# Patient Record
Sex: Female | Born: 1955 | Race: White | Hispanic: No | Smoking: Current every day smoker
Health system: Southern US, Community
[De-identification: ages and names within clinical notes are randomized; demographics above are authoritative.]

## PROBLEM LIST (undated history)

## (undated) ENCOUNTER — Inpatient Hospital Stay: Admission: EM | Payer: Self-pay | Source: Home / Self Care

## (undated) DIAGNOSIS — IMO0002 Reserved for concepts with insufficient information to code with codable children: Secondary | ICD-10-CM

## (undated) DIAGNOSIS — R7303 Prediabetes: Secondary | ICD-10-CM

## (undated) DIAGNOSIS — M1710 Unilateral primary osteoarthritis, unspecified knee: Secondary | ICD-10-CM

## (undated) DIAGNOSIS — M25529 Pain in unspecified elbow: Secondary | ICD-10-CM

## (undated) DIAGNOSIS — N189 Chronic kidney disease, unspecified: Secondary | ICD-10-CM

## (undated) DIAGNOSIS — R112 Nausea with vomiting, unspecified: Secondary | ICD-10-CM

## (undated) DIAGNOSIS — A6000 Herpesviral infection of urogenital system, unspecified: Secondary | ICD-10-CM

## (undated) DIAGNOSIS — E079 Disorder of thyroid, unspecified: Secondary | ICD-10-CM

## (undated) DIAGNOSIS — G2581 Restless legs syndrome: Secondary | ICD-10-CM

## (undated) DIAGNOSIS — R011 Cardiac murmur, unspecified: Secondary | ICD-10-CM

## (undated) DIAGNOSIS — M7530 Calcific tendinitis of unspecified shoulder: Secondary | ICD-10-CM

## (undated) DIAGNOSIS — M549 Dorsalgia, unspecified: Secondary | ICD-10-CM

## (undated) DIAGNOSIS — M961 Postlaminectomy syndrome, not elsewhere classified: Secondary | ICD-10-CM

## (undated) DIAGNOSIS — E039 Hypothyroidism, unspecified: Secondary | ICD-10-CM

## (undated) DIAGNOSIS — G4730 Sleep apnea, unspecified: Secondary | ICD-10-CM

## (undated) DIAGNOSIS — F419 Anxiety disorder, unspecified: Secondary | ICD-10-CM

## (undated) DIAGNOSIS — G8929 Other chronic pain: Secondary | ICD-10-CM

## (undated) DIAGNOSIS — E785 Hyperlipidemia, unspecified: Secondary | ICD-10-CM

## (undated) DIAGNOSIS — Z9289 Personal history of other medical treatment: Secondary | ICD-10-CM

## (undated) DIAGNOSIS — M545 Low back pain, unspecified: Secondary | ICD-10-CM

## (undated) DIAGNOSIS — R0600 Dyspnea, unspecified: Secondary | ICD-10-CM

## (undated) DIAGNOSIS — J439 Emphysema, unspecified: Secondary | ICD-10-CM

## (undated) DIAGNOSIS — J939 Pneumothorax, unspecified: Secondary | ICD-10-CM

## (undated) DIAGNOSIS — Z9889 Other specified postprocedural states: Secondary | ICD-10-CM

## (undated) DIAGNOSIS — K219 Gastro-esophageal reflux disease without esophagitis: Secondary | ICD-10-CM

## (undated) DIAGNOSIS — F341 Dysthymic disorder: Secondary | ICD-10-CM

## (undated) DIAGNOSIS — I10 Essential (primary) hypertension: Secondary | ICD-10-CM

## (undated) DIAGNOSIS — R519 Headache, unspecified: Secondary | ICD-10-CM

## (undated) DIAGNOSIS — G894 Chronic pain syndrome: Secondary | ICD-10-CM

## (undated) DIAGNOSIS — F17200 Nicotine dependence, unspecified, uncomplicated: Secondary | ICD-10-CM

## (undated) DIAGNOSIS — J449 Chronic obstructive pulmonary disease, unspecified: Secondary | ICD-10-CM

## (undated) DIAGNOSIS — R51 Headache: Secondary | ICD-10-CM

## (undated) HISTORY — DX: Restless legs syndrome: G25.81

## (undated) HISTORY — DX: Postlaminectomy syndrome, not elsewhere classified: M96.1

## (undated) HISTORY — PX: UVULOPALATOPHARYNGOPLASTY: SHX827

## (undated) HISTORY — DX: Reserved for concepts with insufficient information to code with codable children: IMO0002

## (undated) HISTORY — DX: Low back pain: M54.5

## (undated) HISTORY — DX: Calcific tendinitis of unspecified shoulder: M75.30

## (undated) HISTORY — DX: Dysthymic disorder: F34.1

## (undated) HISTORY — DX: Unilateral primary osteoarthritis, unspecified knee: M17.10

## (undated) HISTORY — PX: TUBAL LIGATION: SHX77

## (undated) HISTORY — DX: Gastro-esophageal reflux disease without esophagitis: K21.9

## (undated) HISTORY — DX: Pain in unspecified elbow: M25.529

## (undated) HISTORY — DX: Disorder of thyroid, unspecified: E07.9

## (undated) HISTORY — PX: HAMMER TOE SURGERY: SHX385

## (undated) HISTORY — DX: Chronic pain syndrome: G89.4

## (undated) HISTORY — PX: DILATION AND CURETTAGE OF UTERUS: SHX78

## (undated) HISTORY — PX: TOTAL HIP ARTHROPLASTY: SHX124

## (undated) HISTORY — PX: BACK SURGERY: SHX140

## (undated) HISTORY — DX: Hyperlipidemia, unspecified: E78.5

## (undated) HISTORY — DX: Low back pain, unspecified: M54.50

## (undated) HISTORY — PX: LAPAROSCOPIC CHOLECYSTECTOMY: SUR755

## (undated) HISTORY — PX: VAGINAL HYSTERECTOMY: SUR661

## (undated) HISTORY — PX: KNEE ARTHROSCOPY: SHX127

## (undated) HISTORY — DX: Essential (primary) hypertension: I10

## (undated) HISTORY — PX: JOINT REPLACEMENT: SHX530

## (undated) HISTORY — PX: APPENDECTOMY: SHX54

## (undated) HISTORY — DX: Nicotine dependence, unspecified, uncomplicated: F17.200

---

## 1978-09-02 DIAGNOSIS — Z9289 Personal history of other medical treatment: Secondary | ICD-10-CM

## 1978-09-02 HISTORY — DX: Personal history of other medical treatment: Z92.89

## 1998-01-14 ENCOUNTER — Inpatient Hospital Stay (HOSPITAL_COMMUNITY): Admission: AD | Admit: 1998-01-14 | Discharge: 1998-01-14 | Payer: Self-pay | Admitting: *Deleted

## 1998-06-01 ENCOUNTER — Encounter: Payer: Self-pay | Admitting: Specialist

## 1998-06-02 ENCOUNTER — Encounter: Payer: Self-pay | Admitting: Specialist

## 1998-06-02 ENCOUNTER — Observation Stay (HOSPITAL_COMMUNITY): Admission: RE | Admit: 1998-06-02 | Discharge: 1998-06-02 | Payer: Self-pay | Admitting: Specialist

## 1998-07-07 ENCOUNTER — Encounter: Payer: Self-pay | Admitting: Specialist

## 1998-07-07 ENCOUNTER — Ambulatory Visit (HOSPITAL_COMMUNITY): Admission: RE | Admit: 1998-07-07 | Discharge: 1998-07-07 | Payer: Self-pay | Admitting: Specialist

## 1998-11-07 ENCOUNTER — Other Ambulatory Visit: Admission: RE | Admit: 1998-11-07 | Discharge: 1998-11-07 | Payer: Self-pay | Admitting: *Deleted

## 1998-12-11 ENCOUNTER — Ambulatory Visit (HOSPITAL_COMMUNITY): Admission: RE | Admit: 1998-12-11 | Discharge: 1998-12-11 | Payer: Self-pay | Admitting: *Deleted

## 1999-01-02 ENCOUNTER — Emergency Department (HOSPITAL_COMMUNITY): Admission: EM | Admit: 1999-01-02 | Discharge: 1999-01-02 | Payer: Self-pay | Admitting: Emergency Medicine

## 1999-07-06 ENCOUNTER — Encounter: Payer: Self-pay | Admitting: Specialist

## 1999-07-06 ENCOUNTER — Encounter: Admission: RE | Admit: 1999-07-06 | Discharge: 1999-07-06 | Payer: Self-pay | Admitting: Specialist

## 1999-08-30 ENCOUNTER — Encounter: Payer: Self-pay | Admitting: Specialist

## 1999-08-30 ENCOUNTER — Ambulatory Visit (HOSPITAL_COMMUNITY): Admission: RE | Admit: 1999-08-30 | Discharge: 1999-08-30 | Payer: Self-pay | Admitting: Specialist

## 1999-10-15 ENCOUNTER — Encounter: Payer: Self-pay | Admitting: Specialist

## 1999-10-15 ENCOUNTER — Ambulatory Visit (HOSPITAL_COMMUNITY): Admission: RE | Admit: 1999-10-15 | Discharge: 1999-10-15 | Payer: Self-pay | Admitting: Specialist

## 1999-12-21 ENCOUNTER — Encounter: Payer: Self-pay | Admitting: Specialist

## 1999-12-24 ENCOUNTER — Encounter: Payer: Self-pay | Admitting: Specialist

## 1999-12-24 ENCOUNTER — Inpatient Hospital Stay (HOSPITAL_COMMUNITY): Admission: RE | Admit: 1999-12-24 | Discharge: 1999-12-28 | Payer: Self-pay | Admitting: Specialist

## 2000-03-31 ENCOUNTER — Ambulatory Visit (HOSPITAL_COMMUNITY): Admission: RE | Admit: 2000-03-31 | Discharge: 2000-03-31 | Payer: Self-pay | Admitting: Specialist

## 2000-03-31 ENCOUNTER — Encounter: Payer: Self-pay | Admitting: Specialist

## 2000-04-17 ENCOUNTER — Encounter
Admission: RE | Admit: 2000-04-17 | Discharge: 2000-05-01 | Payer: Self-pay | Admitting: Physical Medicine and Rehabilitation

## 2000-04-23 ENCOUNTER — Other Ambulatory Visit: Admission: RE | Admit: 2000-04-23 | Discharge: 2000-04-23 | Payer: Self-pay | Admitting: *Deleted

## 2000-11-17 ENCOUNTER — Encounter: Admission: RE | Admit: 2000-11-17 | Discharge: 2000-11-17 | Payer: Self-pay | Admitting: Specialist

## 2000-11-17 ENCOUNTER — Encounter: Payer: Self-pay | Admitting: Specialist

## 2001-04-29 ENCOUNTER — Encounter: Admission: RE | Admit: 2001-04-29 | Discharge: 2001-04-29 | Payer: Self-pay | Admitting: Orthopaedic Surgery

## 2001-04-29 ENCOUNTER — Encounter: Payer: Self-pay | Admitting: Orthopaedic Surgery

## 2001-05-21 ENCOUNTER — Encounter: Payer: Self-pay | Admitting: Orthopaedic Surgery

## 2001-05-27 ENCOUNTER — Encounter: Payer: Self-pay | Admitting: Orthopaedic Surgery

## 2001-05-27 ENCOUNTER — Inpatient Hospital Stay (HOSPITAL_COMMUNITY): Admission: RE | Admit: 2001-05-27 | Discharge: 2001-06-01 | Payer: Self-pay | Admitting: Orthopaedic Surgery

## 2001-06-01 ENCOUNTER — Encounter: Payer: Self-pay | Admitting: Orthopaedic Surgery

## 2001-11-17 ENCOUNTER — Emergency Department (HOSPITAL_COMMUNITY): Admission: EM | Admit: 2001-11-17 | Discharge: 2001-11-18 | Payer: Self-pay | Admitting: *Deleted

## 2001-11-23 ENCOUNTER — Encounter: Admission: RE | Admit: 2001-11-23 | Discharge: 2001-11-23 | Payer: Self-pay | Admitting: Urology

## 2001-11-23 ENCOUNTER — Encounter: Payer: Self-pay | Admitting: Urology

## 2001-11-24 ENCOUNTER — Encounter: Admission: RE | Admit: 2001-11-24 | Discharge: 2001-11-24 | Payer: Self-pay | Admitting: Pain Medicine

## 2001-11-24 ENCOUNTER — Encounter: Payer: Self-pay | Admitting: Pain Medicine

## 2001-12-07 ENCOUNTER — Other Ambulatory Visit: Admission: RE | Admit: 2001-12-07 | Discharge: 2001-12-07 | Payer: Self-pay | Admitting: *Deleted

## 2001-12-09 ENCOUNTER — Emergency Department (HOSPITAL_COMMUNITY): Admission: EM | Admit: 2001-12-09 | Discharge: 2001-12-10 | Payer: Self-pay | Admitting: Emergency Medicine

## 2001-12-09 ENCOUNTER — Encounter: Payer: Self-pay | Admitting: Emergency Medicine

## 2002-01-04 ENCOUNTER — Ambulatory Visit (HOSPITAL_BASED_OUTPATIENT_CLINIC_OR_DEPARTMENT_OTHER): Admission: RE | Admit: 2002-01-04 | Discharge: 2002-01-04 | Payer: Self-pay | Admitting: Urology

## 2003-05-12 ENCOUNTER — Other Ambulatory Visit: Admission: RE | Admit: 2003-05-12 | Discharge: 2003-05-12 | Payer: Self-pay | Admitting: *Deleted

## 2003-10-31 ENCOUNTER — Ambulatory Visit (HOSPITAL_BASED_OUTPATIENT_CLINIC_OR_DEPARTMENT_OTHER): Admission: RE | Admit: 2003-10-31 | Discharge: 2003-10-31 | Payer: Self-pay | Admitting: Neurology

## 2003-11-19 ENCOUNTER — Ambulatory Visit (HOSPITAL_BASED_OUTPATIENT_CLINIC_OR_DEPARTMENT_OTHER): Admission: RE | Admit: 2003-11-19 | Discharge: 2003-11-19 | Payer: Self-pay | Admitting: Neurology

## 2004-06-06 ENCOUNTER — Ambulatory Visit: Payer: Self-pay | Admitting: Physician Assistant

## 2004-07-03 ENCOUNTER — Ambulatory Visit: Payer: Self-pay | Admitting: Physician Assistant

## 2004-07-09 ENCOUNTER — Other Ambulatory Visit: Admission: RE | Admit: 2004-07-09 | Discharge: 2004-07-09 | Payer: Self-pay | Admitting: *Deleted

## 2004-08-02 ENCOUNTER — Ambulatory Visit: Payer: Self-pay | Admitting: Physician Assistant

## 2004-08-30 ENCOUNTER — Ambulatory Visit: Payer: Self-pay | Admitting: Physician Assistant

## 2004-08-30 ENCOUNTER — Emergency Department: Payer: Self-pay | Admitting: Unknown Physician Specialty

## 2004-10-01 ENCOUNTER — Ambulatory Visit: Payer: Self-pay | Admitting: Physician Assistant

## 2004-11-09 ENCOUNTER — Ambulatory Visit: Payer: Self-pay | Admitting: Physician Assistant

## 2004-11-22 ENCOUNTER — Ambulatory Visit: Payer: Self-pay | Admitting: Physician Assistant

## 2004-11-29 ENCOUNTER — Ambulatory Visit: Payer: Self-pay | Admitting: Pain Medicine

## 2005-01-01 ENCOUNTER — Ambulatory Visit: Payer: Self-pay | Admitting: Physician Assistant

## 2005-02-04 ENCOUNTER — Ambulatory Visit: Payer: Self-pay | Admitting: Physician Assistant

## 2005-02-11 ENCOUNTER — Ambulatory Visit: Payer: Self-pay | Admitting: Physician Assistant

## 2005-03-06 ENCOUNTER — Ambulatory Visit: Payer: Self-pay | Admitting: Physician Assistant

## 2005-04-03 ENCOUNTER — Ambulatory Visit: Payer: Self-pay | Admitting: Physician Assistant

## 2005-05-01 ENCOUNTER — Ambulatory Visit: Payer: Self-pay | Admitting: Physician Assistant

## 2005-05-29 ENCOUNTER — Ambulatory Visit: Payer: Self-pay | Admitting: Physician Assistant

## 2005-06-27 ENCOUNTER — Ambulatory Visit: Payer: Self-pay | Admitting: Physician Assistant

## 2005-08-08 ENCOUNTER — Ambulatory Visit: Payer: Self-pay | Admitting: Physician Assistant

## 2005-09-04 ENCOUNTER — Ambulatory Visit: Payer: Self-pay | Admitting: Physician Assistant

## 2005-09-16 ENCOUNTER — Ambulatory Visit: Payer: Self-pay | Admitting: Physician Assistant

## 2005-10-01 ENCOUNTER — Ambulatory Visit: Payer: Self-pay | Admitting: Physician Assistant

## 2005-11-13 ENCOUNTER — Ambulatory Visit: Payer: Self-pay | Admitting: Physician Assistant

## 2005-12-19 ENCOUNTER — Ambulatory Visit: Payer: Self-pay | Admitting: Physician Assistant

## 2006-01-06 ENCOUNTER — Encounter: Admission: RE | Admit: 2006-01-06 | Discharge: 2006-01-06 | Payer: Self-pay | Admitting: Specialist

## 2006-01-14 ENCOUNTER — Ambulatory Visit: Payer: Self-pay | Admitting: Physician Assistant

## 2006-02-10 ENCOUNTER — Ambulatory Visit: Payer: Self-pay | Admitting: Physician Assistant

## 2006-02-11 ENCOUNTER — Observation Stay (HOSPITAL_COMMUNITY): Admission: RE | Admit: 2006-02-11 | Discharge: 2006-02-12 | Payer: Self-pay | Admitting: Specialist

## 2006-03-18 ENCOUNTER — Ambulatory Visit: Payer: Self-pay | Admitting: Physician Assistant

## 2006-04-16 ENCOUNTER — Ambulatory Visit: Payer: Self-pay | Admitting: Physician Assistant

## 2006-05-13 ENCOUNTER — Ambulatory Visit: Payer: Self-pay | Admitting: Physician Assistant

## 2006-06-11 ENCOUNTER — Ambulatory Visit: Payer: Self-pay | Admitting: Pain Medicine

## 2006-06-11 ENCOUNTER — Ambulatory Visit: Payer: Self-pay | Admitting: Physician Assistant

## 2006-07-14 ENCOUNTER — Ambulatory Visit: Payer: Self-pay | Admitting: Physician Assistant

## 2006-08-13 ENCOUNTER — Ambulatory Visit: Payer: Self-pay | Admitting: Physician Assistant

## 2006-09-10 ENCOUNTER — Ambulatory Visit: Payer: Self-pay | Admitting: Pain Medicine

## 2006-10-13 ENCOUNTER — Ambulatory Visit: Payer: Self-pay | Admitting: Physician Assistant

## 2006-11-01 ENCOUNTER — Encounter: Admission: RE | Admit: 2006-11-01 | Discharge: 2006-11-01 | Payer: Self-pay | Admitting: Family Medicine

## 2006-11-13 ENCOUNTER — Ambulatory Visit: Payer: Self-pay | Admitting: Physician Assistant

## 2006-12-11 ENCOUNTER — Ambulatory Visit: Payer: Self-pay | Admitting: Pain Medicine

## 2006-12-11 ENCOUNTER — Ambulatory Visit: Payer: Self-pay | Admitting: Physician Assistant

## 2007-01-12 ENCOUNTER — Ambulatory Visit: Payer: Self-pay | Admitting: Physician Assistant

## 2007-01-18 ENCOUNTER — Encounter: Admission: RE | Admit: 2007-01-18 | Discharge: 2007-01-18 | Payer: Self-pay | Admitting: Specialist

## 2007-02-05 ENCOUNTER — Ambulatory Visit: Payer: Self-pay | Admitting: Physician Assistant

## 2007-05-03 ENCOUNTER — Encounter: Admission: RE | Admit: 2007-05-03 | Discharge: 2007-05-03 | Payer: Self-pay | Admitting: Specialist

## 2007-09-18 ENCOUNTER — Ambulatory Visit: Payer: Self-pay | Admitting: Physical Medicine & Rehabilitation

## 2007-09-18 ENCOUNTER — Encounter
Admission: RE | Admit: 2007-09-18 | Discharge: 2007-09-21 | Payer: Self-pay | Admitting: Physical Medicine & Rehabilitation

## 2007-10-20 ENCOUNTER — Ambulatory Visit: Payer: Self-pay | Admitting: Physical Medicine & Rehabilitation

## 2007-12-09 ENCOUNTER — Other Ambulatory Visit: Admission: RE | Admit: 2007-12-09 | Discharge: 2007-12-09 | Payer: Self-pay | Admitting: *Deleted

## 2007-12-17 ENCOUNTER — Encounter
Admission: RE | Admit: 2007-12-17 | Discharge: 2008-03-16 | Payer: Self-pay | Admitting: Physical Medicine & Rehabilitation

## 2007-12-22 ENCOUNTER — Ambulatory Visit: Payer: Self-pay | Admitting: Physical Medicine & Rehabilitation

## 2008-01-20 ENCOUNTER — Ambulatory Visit: Payer: Self-pay | Admitting: Physical Medicine & Rehabilitation

## 2008-02-18 ENCOUNTER — Ambulatory Visit: Payer: Self-pay | Admitting: Physical Medicine & Rehabilitation

## 2008-03-02 ENCOUNTER — Encounter: Payer: Self-pay | Admitting: Internal Medicine

## 2008-03-21 ENCOUNTER — Encounter
Admission: RE | Admit: 2008-03-21 | Discharge: 2008-05-20 | Payer: Self-pay | Admitting: Physical Medicine & Rehabilitation

## 2008-03-22 ENCOUNTER — Ambulatory Visit: Payer: Self-pay | Admitting: Physical Medicine & Rehabilitation

## 2008-04-19 ENCOUNTER — Ambulatory Visit: Payer: Self-pay | Admitting: Physical Medicine & Rehabilitation

## 2008-05-20 ENCOUNTER — Ambulatory Visit: Payer: Self-pay | Admitting: Physical Medicine & Rehabilitation

## 2008-06-17 ENCOUNTER — Encounter
Admission: RE | Admit: 2008-06-17 | Discharge: 2008-09-15 | Payer: Self-pay | Admitting: Physical Medicine & Rehabilitation

## 2008-07-22 ENCOUNTER — Ambulatory Visit: Payer: Self-pay | Admitting: Physical Medicine & Rehabilitation

## 2008-08-19 ENCOUNTER — Ambulatory Visit: Payer: Self-pay | Admitting: Physical Medicine & Rehabilitation

## 2008-09-15 ENCOUNTER — Encounter
Admission: RE | Admit: 2008-09-15 | Discharge: 2008-12-07 | Payer: Self-pay | Admitting: Physical Medicine & Rehabilitation

## 2008-09-20 ENCOUNTER — Ambulatory Visit: Payer: Self-pay | Admitting: Physical Medicine & Rehabilitation

## 2008-10-19 ENCOUNTER — Ambulatory Visit: Payer: Self-pay | Admitting: Physical Medicine & Rehabilitation

## 2008-11-16 ENCOUNTER — Ambulatory Visit: Payer: Self-pay | Admitting: Physical Medicine & Rehabilitation

## 2008-12-07 ENCOUNTER — Encounter
Admission: RE | Admit: 2008-12-07 | Discharge: 2009-03-07 | Payer: Self-pay | Admitting: Physical Medicine & Rehabilitation

## 2008-12-13 ENCOUNTER — Ambulatory Visit: Payer: Self-pay | Admitting: Physical Medicine & Rehabilitation

## 2009-01-17 ENCOUNTER — Ambulatory Visit: Payer: Self-pay | Admitting: Physical Medicine & Rehabilitation

## 2009-02-17 ENCOUNTER — Ambulatory Visit: Payer: Self-pay | Admitting: Physical Medicine & Rehabilitation

## 2009-03-15 ENCOUNTER — Encounter
Admission: RE | Admit: 2009-03-15 | Discharge: 2009-06-13 | Payer: Self-pay | Admitting: Physical Medicine & Rehabilitation

## 2009-03-17 ENCOUNTER — Ambulatory Visit: Payer: Self-pay | Admitting: Physical Medicine & Rehabilitation

## 2009-04-18 ENCOUNTER — Ambulatory Visit: Payer: Self-pay | Admitting: Physical Medicine & Rehabilitation

## 2009-05-17 ENCOUNTER — Ambulatory Visit: Payer: Self-pay | Admitting: Physical Medicine & Rehabilitation

## 2009-06-12 ENCOUNTER — Encounter
Admission: RE | Admit: 2009-06-12 | Discharge: 2009-08-23 | Payer: Self-pay | Admitting: Physical Medicine & Rehabilitation

## 2009-06-16 ENCOUNTER — Ambulatory Visit: Payer: Self-pay | Admitting: Physical Medicine & Rehabilitation

## 2009-07-17 ENCOUNTER — Ambulatory Visit: Payer: Self-pay | Admitting: Physical Medicine & Rehabilitation

## 2009-08-11 ENCOUNTER — Inpatient Hospital Stay (HOSPITAL_COMMUNITY): Admission: EM | Admit: 2009-08-11 | Discharge: 2009-08-16 | Payer: Self-pay | Admitting: Family Medicine

## 2009-09-22 ENCOUNTER — Encounter
Admission: RE | Admit: 2009-09-22 | Discharge: 2009-12-21 | Payer: Self-pay | Admitting: Physical Medicine & Rehabilitation

## 2009-10-16 ENCOUNTER — Ambulatory Visit: Payer: Self-pay | Admitting: Physical Medicine & Rehabilitation

## 2009-11-15 ENCOUNTER — Ambulatory Visit: Payer: Self-pay | Admitting: Physical Medicine & Rehabilitation

## 2009-12-14 ENCOUNTER — Ambulatory Visit: Payer: Self-pay | Admitting: Physical Medicine & Rehabilitation

## 2009-12-22 ENCOUNTER — Encounter: Admission: RE | Admit: 2009-12-22 | Discharge: 2009-12-22 | Payer: Self-pay | Admitting: Family Medicine

## 2010-01-09 ENCOUNTER — Encounter
Admission: RE | Admit: 2010-01-09 | Discharge: 2010-01-11 | Payer: Self-pay | Admitting: Physical Medicine & Rehabilitation

## 2010-01-11 ENCOUNTER — Ambulatory Visit: Payer: Self-pay | Admitting: Physical Medicine & Rehabilitation

## 2010-01-17 ENCOUNTER — Emergency Department (HOSPITAL_COMMUNITY): Admission: EM | Admit: 2010-01-17 | Discharge: 2010-01-17 | Payer: Self-pay | Admitting: Emergency Medicine

## 2010-03-07 ENCOUNTER — Encounter
Admission: RE | Admit: 2010-03-07 | Discharge: 2010-05-29 | Payer: Self-pay | Admitting: Physical Medicine & Rehabilitation

## 2010-03-07 ENCOUNTER — Ambulatory Visit: Payer: Self-pay | Admitting: Physical Medicine & Rehabilitation

## 2010-04-05 ENCOUNTER — Ambulatory Visit: Payer: Self-pay | Admitting: Physical Medicine & Rehabilitation

## 2010-05-03 ENCOUNTER — Ambulatory Visit: Payer: Self-pay | Admitting: Physical Medicine & Rehabilitation

## 2010-05-29 ENCOUNTER — Encounter
Admission: RE | Admit: 2010-05-29 | Discharge: 2010-08-13 | Payer: Self-pay | Source: Home / Self Care | Attending: Physical Medicine & Rehabilitation | Admitting: Physical Medicine & Rehabilitation

## 2010-06-05 ENCOUNTER — Ambulatory Visit: Payer: Self-pay | Admitting: Physical Medicine & Rehabilitation

## 2010-06-28 ENCOUNTER — Ambulatory Visit: Payer: Self-pay | Admitting: Physical Medicine & Rehabilitation

## 2010-07-24 ENCOUNTER — Ambulatory Visit: Payer: Self-pay | Admitting: Physical Medicine & Rehabilitation

## 2010-08-13 ENCOUNTER — Ambulatory Visit: Payer: Self-pay | Admitting: Physical Medicine & Rehabilitation

## 2010-08-20 ENCOUNTER — Inpatient Hospital Stay (HOSPITAL_COMMUNITY)
Admission: EM | Admit: 2010-08-20 | Discharge: 2010-08-22 | Payer: Self-pay | Source: Home / Self Care | Attending: General Surgery | Admitting: General Surgery

## 2010-08-21 ENCOUNTER — Encounter (INDEPENDENT_AMBULATORY_CARE_PROVIDER_SITE_OTHER): Payer: Self-pay | Admitting: General Surgery

## 2010-09-11 ENCOUNTER — Encounter
Admission: RE | Admit: 2010-09-11 | Discharge: 2010-09-13 | Payer: Self-pay | Source: Home / Self Care | Attending: Physical Medicine & Rehabilitation | Admitting: Physical Medicine & Rehabilitation

## 2010-09-13 ENCOUNTER — Ambulatory Visit
Admission: RE | Admit: 2010-09-13 | Discharge: 2010-09-13 | Payer: Self-pay | Source: Home / Self Care | Attending: Physical Medicine & Rehabilitation | Admitting: Physical Medicine & Rehabilitation

## 2010-10-05 NOTE — Consult Note (Signed)
Summary: [facility]  [facility]   Imported By: Bonner Puna 03/23/2008 15:16:42  _____________________________________________________________________  External Attachment:    Type:   Image     Comment:   External Document

## 2010-10-11 ENCOUNTER — Ambulatory Visit (HOSPITAL_BASED_OUTPATIENT_CLINIC_OR_DEPARTMENT_OTHER): Payer: Medicaid Other

## 2010-10-11 ENCOUNTER — Encounter: Payer: Medicare HMO | Attending: Physical Medicine & Rehabilitation

## 2010-10-11 DIAGNOSIS — M961 Postlaminectomy syndrome, not elsewhere classified: Secondary | ICD-10-CM

## 2010-10-11 DIAGNOSIS — M545 Low back pain, unspecified: Secondary | ICD-10-CM | POA: Insufficient documentation

## 2010-10-11 DIAGNOSIS — M1710 Unilateral primary osteoarthritis, unspecified knee: Secondary | ICD-10-CM | POA: Insufficient documentation

## 2010-10-11 DIAGNOSIS — G894 Chronic pain syndrome: Secondary | ICD-10-CM | POA: Insufficient documentation

## 2010-10-11 DIAGNOSIS — F341 Dysthymic disorder: Secondary | ICD-10-CM

## 2010-10-11 DIAGNOSIS — IMO0002 Reserved for concepts with insufficient information to code with codable children: Secondary | ICD-10-CM

## 2010-10-11 DIAGNOSIS — M7530 Calcific tendinitis of unspecified shoulder: Secondary | ICD-10-CM

## 2010-10-11 DIAGNOSIS — F17200 Nicotine dependence, unspecified, uncomplicated: Secondary | ICD-10-CM | POA: Insufficient documentation

## 2010-10-11 DIAGNOSIS — G2581 Restless legs syndrome: Secondary | ICD-10-CM | POA: Insufficient documentation

## 2010-10-11 DIAGNOSIS — Z981 Arthrodesis status: Secondary | ICD-10-CM | POA: Insufficient documentation

## 2010-10-11 DIAGNOSIS — Z96649 Presence of unspecified artificial hip joint: Secondary | ICD-10-CM | POA: Insufficient documentation

## 2010-11-12 LAB — URINALYSIS, ROUTINE W REFLEX MICROSCOPIC
Bilirubin Urine: NEGATIVE
Glucose, UA: NEGATIVE mg/dL
Ketones, ur: NEGATIVE mg/dL
Nitrite: NEGATIVE
Protein, ur: NEGATIVE mg/dL
Specific Gravity, Urine: 1.015 (ref 1.005–1.030)
Urobilinogen, UA: 1 mg/dL (ref 0.0–1.0)
pH: 7.5 (ref 5.0–8.0)

## 2010-11-12 LAB — COMPREHENSIVE METABOLIC PANEL
ALT: 11 U/L (ref 0–35)
AST: 20 U/L (ref 0–37)
Albumin: 4.2 g/dL (ref 3.5–5.2)
Alkaline Phosphatase: 130 U/L — ABNORMAL HIGH (ref 39–117)
BUN: 19 mg/dL (ref 6–23)
CO2: 30 mEq/L (ref 19–32)
Calcium: 9.6 mg/dL (ref 8.4–10.5)
Chloride: 101 mEq/L (ref 96–112)
Creatinine, Ser: 1 mg/dL (ref 0.4–1.2)
GFR calc Af Amer: >60 mL/min (ref >60)
GFR calc non Af Amer: 58 mL/min — ABNORMAL LOW (ref >60)
Glucose, Bld: 107 mg/dL — ABNORMAL HIGH (ref 70–99)
Potassium: 3.5 mEq/L (ref 3.5–5.1)
Sodium: 141 mEq/L (ref 135–145)
Total Bilirubin: 0.2 mg/dL — ABNORMAL LOW (ref 0.3–1.2)
Total Protein: 7.6 g/dL (ref 6.0–8.3)

## 2010-11-12 LAB — CBC
HCT: 40.3 % (ref 36.0–46.0)
Hemoglobin: 13.4 g/dL (ref 12.0–15.0)
MCH: 31.2 pg (ref 26.0–34.0)
MCHC: 33.3 g/dL (ref 30.0–36.0)
MCV: 93.7 fL (ref 78.0–100.0)
Platelets: 256 10*3/uL (ref 150–400)
RBC: 4.3 MIL/uL (ref 3.87–5.11)
RDW: 13.5 % (ref 11.5–15.5)
WBC: 6.3 10*3/uL (ref 4.0–10.5)

## 2010-11-12 LAB — DIFFERENTIAL
Basophils Absolute: 0 10*3/uL (ref 0.0–0.1)
Basophils Relative: 0 % (ref 0–1)
Eosinophils Absolute: 0.1 10*3/uL (ref 0.0–0.7)
Eosinophils Relative: 1 % (ref 0–5)
Lymphocytes Relative: 25 % (ref 12–46)
Lymphs Abs: 1.6 10*3/uL (ref 0.7–4.0)
Monocytes Absolute: 0.3 10*3/uL (ref 0.1–1.0)
Monocytes Relative: 4 % (ref 3–12)
Neutro Abs: 4.4 10*3/uL (ref 1.7–7.7)
Neutrophils Relative %: 70 % (ref 43–77)

## 2010-11-12 LAB — URINE MICROSCOPIC-ADD ON

## 2010-11-12 LAB — LIPASE, BLOOD: Lipase: 29 U/L (ref 11–59)

## 2010-11-16 ENCOUNTER — Ambulatory Visit (HOSPITAL_BASED_OUTPATIENT_CLINIC_OR_DEPARTMENT_OTHER): Payer: Medicaid Other | Admitting: Physical Medicine & Rehabilitation

## 2010-11-16 ENCOUNTER — Encounter: Payer: Medicare HMO | Attending: Physical Medicine & Rehabilitation

## 2010-11-16 DIAGNOSIS — Z981 Arthrodesis status: Secondary | ICD-10-CM | POA: Insufficient documentation

## 2010-11-16 DIAGNOSIS — F341 Dysthymic disorder: Secondary | ICD-10-CM

## 2010-11-16 DIAGNOSIS — M67919 Unspecified disorder of synovium and tendon, unspecified shoulder: Secondary | ICD-10-CM | POA: Insufficient documentation

## 2010-11-16 DIAGNOSIS — M961 Postlaminectomy syndrome, not elsewhere classified: Secondary | ICD-10-CM

## 2010-11-16 DIAGNOSIS — M7530 Calcific tendinitis of unspecified shoulder: Secondary | ICD-10-CM

## 2010-11-16 DIAGNOSIS — G2581 Restless legs syndrome: Secondary | ICD-10-CM | POA: Insufficient documentation

## 2010-11-16 DIAGNOSIS — M4120 Other idiopathic scoliosis, site unspecified: Secondary | ICD-10-CM | POA: Insufficient documentation

## 2010-11-16 DIAGNOSIS — M719 Bursopathy, unspecified: Secondary | ICD-10-CM | POA: Insufficient documentation

## 2010-11-16 DIAGNOSIS — M1710 Unilateral primary osteoarthritis, unspecified knee: Secondary | ICD-10-CM | POA: Insufficient documentation

## 2010-11-16 DIAGNOSIS — F17200 Nicotine dependence, unspecified, uncomplicated: Secondary | ICD-10-CM | POA: Insufficient documentation

## 2010-11-16 DIAGNOSIS — M25519 Pain in unspecified shoulder: Secondary | ICD-10-CM | POA: Insufficient documentation

## 2010-11-16 DIAGNOSIS — Z96649 Presence of unspecified artificial hip joint: Secondary | ICD-10-CM | POA: Insufficient documentation

## 2010-11-19 LAB — DIFFERENTIAL
Basophils Absolute: 0 10*3/uL (ref 0.0–0.1)
Basophils Relative: 1 % (ref 0–1)
Eosinophils Absolute: 0.1 10*3/uL (ref 0.0–0.7)
Eosinophils Relative: 2 % (ref 0–5)
Lymphocytes Relative: 35 % (ref 12–46)
Lymphs Abs: 2.3 10*3/uL (ref 0.7–4.0)
Monocytes Absolute: 0.3 10*3/uL (ref 0.1–1.0)
Monocytes Relative: 5 % (ref 3–12)
Neutro Abs: 3.8 10*3/uL (ref 1.7–7.7)
Neutrophils Relative %: 57 % (ref 43–77)

## 2010-11-19 LAB — POCT I-STAT, CHEM 8
BUN: 21 mg/dL (ref 6–23)
Calcium, Ion: 1.15 mmol/L (ref 1.12–1.32)
Chloride: 108 mEq/L (ref 96–112)
Creatinine, Ser: 0.8 mg/dL (ref 0.4–1.2)
Glucose, Bld: 104 mg/dL — ABNORMAL HIGH (ref 70–99)
HCT: 35 % — ABNORMAL LOW (ref 36.0–46.0)
Hemoglobin: 11.9 g/dL — ABNORMAL LOW (ref 12.0–15.0)
Potassium: 3.4 mEq/L — ABNORMAL LOW (ref 3.5–5.1)
Sodium: 142 mEq/L (ref 135–145)
TCO2: 27 mmol/L (ref 0–100)

## 2010-11-19 LAB — CBC
HCT: 34.8 % — ABNORMAL LOW (ref 36.0–46.0)
Hemoglobin: 12.1 g/dL (ref 12.0–15.0)
MCHC: 34.6 g/dL (ref 30.0–36.0)
MCV: 92.2 fL (ref 78.0–100.0)
Platelets: 209 10*3/uL (ref 150–400)
RBC: 3.78 MIL/uL — ABNORMAL LOW (ref 3.87–5.11)
RDW: 14.1 % (ref 11.5–15.5)
WBC: 6.7 10*3/uL (ref 4.0–10.5)

## 2010-11-19 LAB — POCT CARDIAC MARKERS
CKMB, poc: 1.8 ng/mL (ref 1.0–8.0)
Myoglobin, poc: 48.3 ng/mL (ref 12–200)
Troponin i, poc: <0.05 ng/mL (ref 0.00–0.09)

## 2010-11-19 LAB — D-DIMER, QUANTITATIVE: D-Dimer, Quant: 0.34 ug/mL-FEU (ref 0.00–0.48)

## 2010-12-04 LAB — BASIC METABOLIC PANEL
BUN: 22 mg/dL (ref 6–23)
BUN: 8 mg/dL (ref 6–23)
BUN: 8 mg/dL (ref 6–23)
CO2: 26 mEq/L (ref 19–32)
CO2: 31 mEq/L (ref 19–32)
CO2: 31 mEq/L (ref 19–32)
Calcium: 7.9 mg/dL — ABNORMAL LOW (ref 8.4–10.5)
Calcium: 8.4 mg/dL (ref 8.4–10.5)
Calcium: 8.7 mg/dL (ref 8.4–10.5)
Chloride: 100 mEq/L (ref 96–112)
Chloride: 102 mEq/L (ref 96–112)
Chloride: 105 mEq/L (ref 96–112)
Creatinine, Ser: 0.71 mg/dL (ref 0.4–1.2)
Creatinine, Ser: 0.91 mg/dL (ref 0.4–1.2)
Creatinine, Ser: 1.02 mg/dL (ref 0.4–1.2)
GFR calc Af Amer: >60 mL/min (ref >60)
GFR calc Af Amer: >60 mL/min (ref >60)
GFR calc Af Amer: >60 mL/min (ref >60)
GFR calc non Af Amer: 56 mL/min — ABNORMAL LOW (ref >60)
GFR calc non Af Amer: >60 mL/min (ref >60)
GFR calc non Af Amer: >60 mL/min (ref >60)
Glucose, Bld: 100 mg/dL — ABNORMAL HIGH (ref 70–99)
Glucose, Bld: 102 mg/dL — ABNORMAL HIGH (ref 70–99)
Glucose, Bld: 111 mg/dL — ABNORMAL HIGH (ref 70–99)
Potassium: 3.3 mEq/L — ABNORMAL LOW (ref 3.5–5.1)
Potassium: 3.7 mEq/L (ref 3.5–5.1)
Potassium: 3.7 mEq/L (ref 3.5–5.1)
Sodium: 136 mEq/L (ref 135–145)
Sodium: 139 mEq/L (ref 135–145)
Sodium: 139 mEq/L (ref 135–145)

## 2010-12-04 LAB — DIFFERENTIAL
Basophils Absolute: 0 10*3/uL (ref 0.0–0.1)
Basophils Absolute: 0 10*3/uL (ref 0.0–0.1)
Basophils Absolute: 0 10*3/uL (ref 0.0–0.1)
Basophils Absolute: 0 10*3/uL (ref 0.0–0.1)
Basophils Relative: 0 % (ref 0–1)
Basophils Relative: 0 % (ref 0–1)
Basophils Relative: 1 % (ref 0–1)
Basophils Relative: 1 % (ref 0–1)
Eosinophils Absolute: 0 10*3/uL (ref 0.0–0.7)
Eosinophils Absolute: 0.1 10*3/uL (ref 0.0–0.7)
Eosinophils Absolute: 0.1 10*3/uL (ref 0.0–0.7)
Eosinophils Absolute: 0.1 10*3/uL (ref 0.0–0.7)
Eosinophils Relative: 0 % (ref 0–5)
Eosinophils Relative: 2 % (ref 0–5)
Eosinophils Relative: 2 % (ref 0–5)
Eosinophils Relative: 2 % (ref 0–5)
Lymphocytes Relative: 33 % (ref 12–46)
Lymphocytes Relative: 36 % (ref 12–46)
Lymphocytes Relative: 38 % (ref 12–46)
Lymphocytes Relative: 8 % — ABNORMAL LOW (ref 12–46)
Lymphs Abs: 0.7 10*3/uL (ref 0.7–4.0)
Lymphs Abs: 1.7 10*3/uL (ref 0.7–4.0)
Lymphs Abs: 2 10*3/uL (ref 0.7–4.0)
Lymphs Abs: 2.2 10*3/uL (ref 0.7–4.0)
Monocytes Absolute: 0.4 10*3/uL (ref 0.1–1.0)
Monocytes Absolute: 0.4 10*3/uL (ref 0.1–1.0)
Monocytes Absolute: 0.4 10*3/uL (ref 0.1–1.0)
Monocytes Absolute: 0.5 10*3/uL (ref 0.1–1.0)
Monocytes Relative: 4 % (ref 3–12)
Monocytes Relative: 6 % (ref 3–12)
Monocytes Relative: 8 % (ref 3–12)
Monocytes Relative: 9 % (ref 3–12)
Neutro Abs: 2.8 10*3/uL (ref 1.7–7.7)
Neutro Abs: 2.9 10*3/uL (ref 1.7–7.7)
Neutro Abs: 3.4 10*3/uL (ref 1.7–7.7)
Neutro Abs: 8.3 10*3/uL — ABNORMAL HIGH (ref 1.7–7.7)
Neutrophils Relative %: 52 % (ref 43–77)
Neutrophils Relative %: 56 % (ref 43–77)
Neutrophils Relative %: 56 % (ref 43–77)
Neutrophils Relative %: 88 % — ABNORMAL HIGH (ref 43–77)

## 2010-12-04 LAB — COMPREHENSIVE METABOLIC PANEL
ALT: 12 U/L (ref 0–35)
ALT: 14 U/L (ref 0–35)
ALT: 16 U/L (ref 0–35)
AST: 25 U/L (ref 0–37)
AST: 28 U/L (ref 0–37)
AST: 31 U/L (ref 0–37)
Albumin: 2.5 g/dL — ABNORMAL LOW (ref 3.5–5.2)
Albumin: 2.5 g/dL — ABNORMAL LOW (ref 3.5–5.2)
Albumin: 3.9 g/dL (ref 3.5–5.2)
Alkaline Phosphatase: 130 U/L — ABNORMAL HIGH (ref 39–117)
Alkaline Phosphatase: 71 U/L (ref 39–117)
Alkaline Phosphatase: 91 U/L (ref 39–117)
BUN: 12 mg/dL (ref 6–23)
BUN: 13 mg/dL (ref 6–23)
BUN: 30 mg/dL — ABNORMAL HIGH (ref 6–23)
CO2: 27 mEq/L (ref 19–32)
CO2: 28 mEq/L (ref 19–32)
CO2: 30 mEq/L (ref 19–32)
Calcium: 8 mg/dL — ABNORMAL LOW (ref 8.4–10.5)
Calcium: 8.3 mg/dL — ABNORMAL LOW (ref 8.4–10.5)
Calcium: 9.2 mg/dL (ref 8.4–10.5)
Chloride: 100 mEq/L (ref 96–112)
Chloride: 103 mEq/L (ref 96–112)
Chloride: 107 mEq/L (ref 96–112)
Creatinine, Ser: 0.95 mg/dL (ref 0.4–1.2)
Creatinine, Ser: 0.97 mg/dL (ref 0.4–1.2)
Creatinine, Ser: 1.45 mg/dL — ABNORMAL HIGH (ref 0.4–1.2)
GFR calc Af Amer: 46 mL/min — ABNORMAL LOW (ref >60)
GFR calc Af Amer: >60 mL/min (ref >60)
GFR calc Af Amer: >60 mL/min (ref >60)
GFR calc non Af Amer: 38 mL/min — ABNORMAL LOW (ref >60)
GFR calc non Af Amer: 60 mL/min — ABNORMAL LOW (ref >60)
GFR calc non Af Amer: >60 mL/min (ref >60)
Glucose, Bld: 102 mg/dL — ABNORMAL HIGH (ref 70–99)
Glucose, Bld: 168 mg/dL — ABNORMAL HIGH (ref 70–99)
Glucose, Bld: 89 mg/dL (ref 70–99)
Potassium: 3.2 mEq/L — ABNORMAL LOW (ref 3.5–5.1)
Potassium: 3.7 mEq/L (ref 3.5–5.1)
Potassium: 5.4 mEq/L — ABNORMAL HIGH (ref 3.5–5.1)
Sodium: 136 mEq/L (ref 135–145)
Sodium: 138 mEq/L (ref 135–145)
Sodium: 140 mEq/L (ref 135–145)
Total Bilirubin: 0.2 mg/dL — ABNORMAL LOW (ref 0.3–1.2)
Total Bilirubin: 0.4 mg/dL (ref 0.3–1.2)
Total Bilirubin: 0.4 mg/dL (ref 0.3–1.2)
Total Protein: 5.3 g/dL — ABNORMAL LOW (ref 6.0–8.3)
Total Protein: 5.3 g/dL — ABNORMAL LOW (ref 6.0–8.3)
Total Protein: 7.2 g/dL (ref 6.0–8.3)

## 2010-12-04 LAB — URINALYSIS, ROUTINE W REFLEX MICROSCOPIC
Bilirubin Urine: NEGATIVE
Glucose, UA: NEGATIVE mg/dL
Hgb urine dipstick: NEGATIVE
Ketones, ur: NEGATIVE mg/dL
Nitrite: NEGATIVE
Protein, ur: NEGATIVE mg/dL
Specific Gravity, Urine: 1.024 (ref 1.005–1.030)
Urobilinogen, UA: 0.2 mg/dL (ref 0.0–1.0)
pH: 5.5 (ref 5.0–8.0)

## 2010-12-04 LAB — CBC
HCT: 26.4 % — ABNORMAL LOW (ref 36.0–46.0)
HCT: 26.6 % — ABNORMAL LOW (ref 36.0–46.0)
HCT: 27 % — ABNORMAL LOW (ref 36.0–46.0)
HCT: 27.5 % — ABNORMAL LOW (ref 36.0–46.0)
HCT: 30.8 % — ABNORMAL LOW (ref 36.0–46.0)
HCT: 36.4 % (ref 36.0–46.0)
Hemoglobin: 10.5 g/dL — ABNORMAL LOW (ref 12.0–15.0)
Hemoglobin: 12 g/dL (ref 12.0–15.0)
Hemoglobin: 8.9 g/dL — ABNORMAL LOW (ref 12.0–15.0)
Hemoglobin: 8.9 g/dL — ABNORMAL LOW (ref 12.0–15.0)
Hemoglobin: 9.1 g/dL — ABNORMAL LOW (ref 12.0–15.0)
Hemoglobin: 9.3 g/dL — ABNORMAL LOW (ref 12.0–15.0)
MCHC: 33 g/dL (ref 30.0–36.0)
MCHC: 33.5 g/dL (ref 30.0–36.0)
MCHC: 33.6 g/dL (ref 30.0–36.0)
MCHC: 33.6 g/dL (ref 30.0–36.0)
MCHC: 33.8 g/dL (ref 30.0–36.0)
MCHC: 34.2 g/dL (ref 30.0–36.0)
MCV: 92 fL (ref 78.0–100.0)
MCV: 93 fL (ref 78.0–100.0)
MCV: 93.1 fL (ref 78.0–100.0)
MCV: 93.1 fL (ref 78.0–100.0)
MCV: 93.2 fL (ref 78.0–100.0)
MCV: 93.2 fL (ref 78.0–100.0)
Platelets: 144 10*3/uL — ABNORMAL LOW (ref 150–400)
Platelets: 147 10*3/uL — ABNORMAL LOW (ref 150–400)
Platelets: 162 10*3/uL (ref 150–400)
Platelets: 183 10*3/uL (ref 150–400)
Platelets: 203 10*3/uL (ref 150–400)
Platelets: 235 10*3/uL (ref 150–400)
RBC: 2.83 MIL/uL — ABNORMAL LOW (ref 3.87–5.11)
RBC: 2.86 MIL/uL — ABNORMAL LOW (ref 3.87–5.11)
RBC: 2.9 MIL/uL — ABNORMAL LOW (ref 3.87–5.11)
RBC: 2.99 MIL/uL — ABNORMAL LOW (ref 3.87–5.11)
RBC: 3.3 MIL/uL — ABNORMAL LOW (ref 3.87–5.11)
RBC: 3.91 MIL/uL (ref 3.87–5.11)
RDW: 13.6 % (ref 11.5–15.5)
RDW: 13.8 % (ref 11.5–15.5)
RDW: 13.9 % (ref 11.5–15.5)
RDW: 14 % (ref 11.5–15.5)
RDW: 14.2 % (ref 11.5–15.5)
RDW: 14.4 % (ref 11.5–15.5)
WBC: 5.3 10*3/uL (ref 4.0–10.5)
WBC: 5.4 10*3/uL (ref 4.0–10.5)
WBC: 6.2 10*3/uL (ref 4.0–10.5)
WBC: 6.5 10*3/uL (ref 4.0–10.5)
WBC: 7.6 10*3/uL (ref 4.0–10.5)
WBC: 9.4 10*3/uL (ref 4.0–10.5)

## 2010-12-04 LAB — MAGNESIUM
Magnesium: 1.8 mg/dL (ref 1.5–2.5)
Magnesium: 1.8 mg/dL (ref 1.5–2.5)
Magnesium: 2.5 mg/dL (ref 1.5–2.5)

## 2010-12-04 LAB — PROTIME-INR
INR: 1.03 (ref 0.00–1.49)
INR: 1.18 (ref 0.00–1.49)
INR: 1.35 (ref 0.00–1.49)
INR: 1.36 (ref 0.00–1.49)
INR: 1.45 (ref 0.00–1.49)
INR: 1.69 — ABNORMAL HIGH (ref 0.00–1.49)
Prothrombin Time: 13.4 seconds (ref 11.6–15.2)
Prothrombin Time: 14.9 seconds (ref 11.6–15.2)
Prothrombin Time: 16.6 seconds — ABNORMAL HIGH (ref 11.6–15.2)
Prothrombin Time: 16.7 seconds — ABNORMAL HIGH (ref 11.6–15.2)
Prothrombin Time: 17.5 seconds — ABNORMAL HIGH (ref 11.6–15.2)
Prothrombin Time: 19.7 seconds — ABNORMAL HIGH (ref 11.6–15.2)

## 2010-12-04 LAB — TYPE AND SCREEN
ABO/RH(D): A POS
Antibody Screen: NEGATIVE

## 2010-12-04 LAB — ABO/RH: ABO/RH(D): A POS

## 2010-12-04 LAB — URINE CULTURE
Colony Count: NO GROWTH
Culture: NO GROWTH

## 2010-12-04 LAB — APTT: aPTT: 30 seconds (ref 24–37)

## 2010-12-13 ENCOUNTER — Encounter: Payer: Medicare HMO | Attending: Physical Medicine & Rehabilitation

## 2010-12-13 DIAGNOSIS — F341 Dysthymic disorder: Secondary | ICD-10-CM

## 2010-12-13 DIAGNOSIS — IMO0002 Reserved for concepts with insufficient information to code with codable children: Secondary | ICD-10-CM

## 2010-12-13 DIAGNOSIS — M1710 Unilateral primary osteoarthritis, unspecified knee: Secondary | ICD-10-CM | POA: Insufficient documentation

## 2010-12-13 DIAGNOSIS — Z96649 Presence of unspecified artificial hip joint: Secondary | ICD-10-CM | POA: Insufficient documentation

## 2010-12-13 DIAGNOSIS — M546 Pain in thoracic spine: Secondary | ICD-10-CM | POA: Insufficient documentation

## 2010-12-13 DIAGNOSIS — M961 Postlaminectomy syndrome, not elsewhere classified: Secondary | ICD-10-CM

## 2010-12-13 DIAGNOSIS — M25519 Pain in unspecified shoulder: Secondary | ICD-10-CM | POA: Insufficient documentation

## 2010-12-13 DIAGNOSIS — Q762 Congenital spondylolisthesis: Secondary | ICD-10-CM

## 2010-12-13 DIAGNOSIS — M4180 Other forms of scoliosis, site unspecified: Secondary | ICD-10-CM | POA: Insufficient documentation

## 2010-12-13 DIAGNOSIS — M539 Dorsopathy, unspecified: Secondary | ICD-10-CM | POA: Insufficient documentation

## 2010-12-13 DIAGNOSIS — G2581 Restless legs syndrome: Secondary | ICD-10-CM | POA: Insufficient documentation

## 2010-12-31 NOTE — Assessment & Plan Note (Signed)
Jill Phillips is here regarding her multiple pain complaints.  She has been doing fairly well except for her right shoulder which has bothered her lately.  She injured it the last summer that has been a problem off and on, but it has gotten worse over the last month or so.  She has a problem sleeping on the arm sleeping in general at night and also with overhead activities.  Pain starts in the shoulder and radiates down the lateral aspect of the arm to the elbows, sometimes beyond.  She denies any numbness or weakness in the arm per se other than that associated with pain.  Pain is 5-6/10, described as sharp, dull, aching.  Pain interferes with general activity, relations with others, enjoyment of life on a mild level.  Sleep is poor.  REVIEW OF SYSTEMS:  Notable for the above.  Full 12-point review is in the written health and history section of the chart.  SOCIAL HISTORY:  Unchanged.  She is living at home with her cat, still smoking 2 pack of cigarettes per day.  PHYSICAL EXAMINATION:  Blood pressure 125/82, pulse 63, and respiratory rate 18.  She is saturating 98% on room air.  The patient is a pleasant, alert, oriented x3.  Affect is showing bright and appropriate. Apprehension test is positive at the shoulder as well as impingement signs.  She has no signs of rotator cuff tear on exam.  She has pain with palpation over the lateral and anterior shoulder.  Biceps tendons are nontender. Back is generally stable with some pain noted in the midthoracic spine with some prominence of the paraspinal musculature on the left.  Does have a mild dextroscoliosis.  Cognitively, she is alert and appropriate. Weight is stable.  ASSESSMENT: 1. 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 2. Osteoarthritis of the right knee. 3. History of right rotator cuff syndrome and bursitis with acute     exacerbation. 4. History of right hip fracture replacement. 5. Restless  legs syndrome.  PLAN: 1. Continue Mirapex 0.25 mg 1 at bedtime. 2. Refill her fentanyl patch 100 mcg q.72 h. and Talwin NX #60. 3. After informed consent, I injected the shoulder via lateral     approach with 40 mg of Kenalog and 3 mL of 1% lidocaine.  The     patient tolerated it well.  I injected the subdeltoid bursa as     exited with the needle. 4. I will see the patient back here in the office in about 2-4 months.     She will see my nurse practitioner in the interim as well.     Meredith Staggers, M.D. Electronically Signed    ZTS/MedQ D:  11/16/2010 13:49:52  T:  11/17/2010 00:09:13  Job #:  [id number]  cc:   Cletus Gash T. Pedro Earls, MD

## 2011-01-10 ENCOUNTER — Encounter: Payer: Medicare HMO | Attending: Neurosurgery | Admitting: Neurosurgery

## 2011-01-10 DIAGNOSIS — M778 Other enthesopathies, not elsewhere classified: Secondary | ICD-10-CM | POA: Insufficient documentation

## 2011-01-10 DIAGNOSIS — M1710 Unilateral primary osteoarthritis, unspecified knee: Secondary | ICD-10-CM | POA: Insufficient documentation

## 2011-01-10 DIAGNOSIS — R609 Edema, unspecified: Secondary | ICD-10-CM | POA: Insufficient documentation

## 2011-01-10 DIAGNOSIS — M25519 Pain in unspecified shoulder: Secondary | ICD-10-CM | POA: Insufficient documentation

## 2011-01-10 DIAGNOSIS — M25529 Pain in unspecified elbow: Secondary | ICD-10-CM

## 2011-01-10 DIAGNOSIS — K1370 Unspecified lesions of oral mucosa: Secondary | ICD-10-CM | POA: Insufficient documentation

## 2011-01-10 DIAGNOSIS — M545 Low back pain, unspecified: Secondary | ICD-10-CM | POA: Insufficient documentation

## 2011-01-10 DIAGNOSIS — R209 Unspecified disturbances of skin sensation: Secondary | ICD-10-CM | POA: Insufficient documentation

## 2011-01-11 NOTE — Assessment & Plan Note (Signed)
HISTORY OF PRESENT ILLNESS:  [name] is a patient Dr. Naaman Plummer.  She is followed up for multiple plain complaints.  She does have some right shoulder pain after a fall.  Dr. Naaman Plummer injected her shoulder.  She has not got a lot of relief with that.  She states that this is mainly pain from the biceps down into her hand, especially when she sleeps on in odd positions.  She rates her pain at about 6 today.  General activity level is about 7. Sleep patterns are poor.  Pain is worse in morning and in the evening. Rest and medication tend to help.  Activities aggravate it.  She walks without assistance.  She can walk for about 30 minutes without any problem.  She does climb steps and she does not drive.  REVIEW OF SYSTEMS:  Notable for those difficulties as well as some limb swelling.  She does have complaints today of bilateral lower extremity edema in her feet tend to "get asleep."  She has some constipation, occasional bowel and bladder issues.  PHYSICAL EXAMINATION:  VITAL SIGNS:  Her blood pressure is 129/69, pulse 102, respirations are 18, and O2 sat is 95 on room air. GENERAL:  She is alert and oriented x3.  Constitutionally, she is within normal limits. MUSCULOSKELETAL:  Her strength in her upper extremity is 5/5 including the deltoid, biceps, triceps, intrinsics, and grip.  She has some decreased range of motion in the right arm and she does have kind of give way to pain.  She does have some edema in lower extremities about 1+ bilaterally.  She states she has noticed some broken veins in her feet she has never seen before.  She also has a "sore" in the right corner of her mouth that she states will not dissipate and keeps opening up.  ASSESSMENT: 1. Chronic lumbar pain. 2. Osteoarthritis of knees. 3. History of right rotator cuff syndrome with acute bursitis in her     arm and forearm. 4. Right hip fracture with replacement. 5. Restless legs syndrome. 6. Lower extremity  edema. 7. Lesion in the right corner of her mouth.  PLAN: 1. She has requested only a refill for her and Duragesic patches 100     mcg one transdermal every 72 hours.  I gave her 10 with no refill. 2. She is to follow up with primary care doctor regarding her edema in     lower extremities for possible increase in her hydrochlorothiazide     which is now at 12.5 daily and that mouth lesion. 3. I have encouraged her to follow up with Dr. [name] regarding right     arm pain.  She states is worsening and she feels like her biceps is     not exactly "right."  She feel like she needs to obtain an x-ray     possible MRI since she has got some tendon disruption.  She will     follow up here in the clinic in 1 month.    Jill Phillips Electronically Signed   RLW/MedQ D:  01/10/2011 11:03:24  T:  01/11/2011 00:06:12  Job #:  [id number]

## 2011-01-15 NOTE — Assessment & Plan Note (Signed)
Jill Phillips is back regarding her pain issues related to her back and right  knee.  She has been under fair control over the last few months.  We  injected her back with trigger point shots in November and these worked  very nicely.  She had questions about acquiring a back brace today for  support and to add modalities such as heat and cold.  The patient's  Oswestry score is 34% today.  Her average pain is 3/10.  She remains on  fentanyl patch 100 mcg q.72 h. with Talwin NX 1 q.8 h. p.r.n.  She only  uses one of these a day.  She occasionally uses her Soma for spasm.  She  reports worsening sleep.  Apparently when she and her husband split up,  she did not take the CPAP machine with her and sleep has suffered as a  result.  She had questions about using a stimulant during the day to  help with her daytime arousal.  She is often falling asleep etc.   REVIEW OF SYSTEMS:  Notable for the above.  The patient does report some  wheezing as well.  Mood has been improved.  Full review is in the  written health history section chart.   SOCIAL HISTORY:  The patient is divorced, smoking one-pack of cigarettes  per day.  She is planning to move into her house next month and then  like to have her knee surgery soon thereafter.   PHYSICAL EXAMINATION:  VITAL SIGNS:  Blood pressure is 142/88, pulse is  84, respiratory rate is 14.  She is sating 98% on room air.  GENERAL:  The patient is pleasant, alert and oriented x3.  EXTREMITIES:  She has some pain with lumbar extension and flexion.  She  has a mild dextroscoliosis from L1-L3, again from T12-L1.  The right  knee remains tender to touch but generally stable.  There is notable  valgus malformation there.  She was not wearing her brace today.  HEART:  Regular.  CHEST:  Clear.  ABDOMEN:  Soft, nontender.  NEUROLOGIC:  The patient was alert and cognitively appropriate.   ASSESSMENT:  1. Chronic scoliosis of the spine, has multilevel fusion.  2.  Osteoarthritis of the right knee in need of knee replacement.  3. Left rotator cuff syndrome.  4. Myofascial spine pain.  5. Anxiety with depression.  6. Insomnia/sleep apnea.   PLAN:  1. I have told her it is imperative that she re-acquire her CPAP      machine and begin using that again.  I will not prescribe anything      to help her daytime arousal if she is not using the CPAP and      getting restful sleep.  2. I refilled Duragesic patch 100 mcg today #10.  3. Talwin did not need refill, as she had 31 of these left and only is      using 1 per day.  4. We will send her to orthotist for lumbosacral orthosis fitting.  I      think, she will be better off with this in route as opposed to      ordering on line, as she can come back for adjustments in fitting      as needed.  5. Await knee replacement surgery.  Also, I would like to taper her      fentanyl down.  6. I will see her back in 3 months with Nurse Clinic followup in 1  month.      Meredith Staggers, M.D.  Electronically Signed     ZTS/MedQ  D:  10/19/2008 12:06:32  T:  10/20/2008 00:13:07  Job #:  [id number]   cc:   [name], M.D.  Fax: [phone]

## 2011-01-15 NOTE — Assessment & Plan Note (Signed)
Jill Phillips is back regarding her multiple pain complaints.  I saw her back in  January initially.  We injected her left shoulder and she did very well  with this, and the pain is essentially resolved there for the most part.  Her right knee still gives her fits, but the arthritis brace we fit her  with seems to be helping somewhat unload the knee.  There is no plan  currently for imminent knee replacement.  She had moved temporarily down  to [place] with her daughter but now is back in [place].  She  missed her followup appointment due to some of the psychosocial issues  going on there.  She is now back in [place] to stay.  She wants to  resume her Duragesic patch, which she had come off of since I saw her  last.  Certainly her knee pain and restless leg symptoms have been worse  since then.  Mood is generally improved.  She finds that [name] has helped  her back spasms, but she has been out of this as well.   The patient rates her pain as 6 to 7 out of 10 and describes it as  constant and aching.  Pain interferes with general activity, relations  with others and enjoyment of life on a moderate-to-severe level.  Sleep  is fair to poor.  Pain usually worsens with most activities, especially  house chores.  The patient states that she can walk 10 to 30 minutes  without having to stop.  She usually has to stop due to tightness in her  back and right knee pain.   REVIEW OF SYSTEMS:  Notable for the above.  Full review is in the  written health history section of the chart.   SOCIAL HISTORY:  The patient is separated, living with a friend here in  town.   PHYSICAL EXAMINATION:  VITAL SIGNS:  Blood pressure 105/51, pulse 69,  respiratory rate 20.  She is satting 96% on room air.  GENERAL:  The patient is pleasant, alert and oriented x 3.  EXTREMITIES:  Left shoulder is less painful with range of motion today.  Posture was fair although she still favors the low back and tends to  lean  towards the right with a functional level of scoliosis from the  sacrum to L3 and then also at T12-L1.  The area is slightly tender to  touch and there is some associated muscle spasm there.  Strength is  generally 5/5.  She has a notable valgus formation at the right knee  although she seems to be having less pain with weightbearing while using  her brace.  She was wearing an inappropriate pair of heeled sandals with  no insole cushioning.  HEART:  Regular.  CHEST:  Clear.  ABDOMEN:  Soft, nontender.  NEUROLOGIC:  Mood was appropriate.  Cognition was intact.  Cranial nerve  exam was normal.   ASSESSMENT:  1. Chronic scoliosis, status post multilevel fusion from lumbar three      to sacral one as well as thoracic twelve to lumbar one.  2. Severe osteoarthritis of the right knee.  3. Left rotator cuff syndrome.  4. Anxiety with depression.  5. Centralized pain syndrome.  6. Insomnia, questionable sleep apnea.   PLAN:  1. Resume fentanyl patch 100 mcg q.72h. with Talwin for breakthrough      pain.  I explained to her today that I will not continue      prescribing these medications if  she fails to follow up on a      regular basis.  She had some withdrawal symptoms previously when      running out of the fentanyl patch and expressed an understanding      today.  2. Continue with osteoarthritis brace.  3. We will refill Soma 350 mg q.8h. p.r.n. for spasms.  4. Discussed appropriate shoe wear with the patient today.  She      understood.  5. Encouraged activity as possible.  6. Consider trial of Requip or Neurontin for restless leg/centralized      pain symptoms.  7. I will see her back in 2 months.  Nurse clinic followup in 1      month's time.      Meredith Staggers, M.D.  Electronically Signed     ZTS/MedQ  D:  12/22/2007 10:25:21  T:  12/22/2007 11:14:30  Job #:  [id number]   cc:   [name], M.D.  Fax: [phone]

## 2011-01-15 NOTE — Assessment & Plan Note (Signed)
Jill Phillips is back regarding her multiple pain issues in her back and right  knee.  She is receiving Hyalgan now through Dr. Otho Ket office and has  not noticed much effect as of yet.  She did not get her LSO as ordered  because she lost the prescription.  She continues to have lot of pain in  her back and knee.  She rates her the knee pain as 6/10 and back pain  4/10.  She has some spasms in the left groin today, which were new.  She  describes her pain in the knee as sharp in the back is dull and aching.  Pain interferes with general activity, relations with others, enjoyment  of life on a moderate-to-severe level.  She feels that she had to alter  her lifestyle quite a bit due to her pain in the right knee.   REVIEW OF SYSTEMS:  Notable for the above as well as occasional spasms  in the back.  She complains of swelling in the right knee occasionally  as well.  She has intermittent abdominal pain and sleep apnea.  Full 14-  point review is in the written health and history section.  Other  pertinent positives are above.   SOCIAL HISTORY:  The patient is divorced, living with her friend.  She  is smoking a pack of cigarettes per day.   PHYSICAL EXAMINATION:  Blood pressure is 143/93, pulse is 80, and  respiratory rate 20.  She is saturating 98% on room air.  The patient is  pleasant, alert, and oriented x3.  She continues to have pain with  extension and flexion of the lumbar spine.  There is a mild  dextroscoliosis from L1-L3 as well as T12-L1.  Right knee is moderately  tender to touch, but no signs of instability were seen.  Minimal  swelling was appreciated.  She does have a valgus deformity there.  Heart was regular.  Chest was clear.  Abdomen, soft and nontender.  Cognitively she is appropriate.   ASSESSMENT:  1. Chronic scoliosis of the high lumbar and low thoracic spine to the      right.  2. History of multilevel spine fusion.  3. Osteoarthritis of the right knee.   PLAN:  1. Discussed her long-term prognosis today regarding her pain.      Obviously with multiple conservative interventions her knee is not      improving.  She needs to discuss with Dr. [name] regarding her      decreasing quality of life and reasons that she would like to go      forth with the knee replacement.  She does understand that knee      replacement is a finite process and would potentially need revision      in the future.  2. I will refill Talwin NX #60 and Duragesic patch 100 mcg #10.  3. We will see her back in 1 month with nursing, 3 months with me.      Meredith Staggers, M.D.  Electronically Signed     ZTS/MedQ  D:  01/17/2009 11:09:35  T:  01/18/2009 [id number]  Job #:  [id number]   cc:   [name], M.D.  Fax: [phone]

## 2011-01-15 NOTE — Assessment & Plan Note (Signed)
Deem is back regarding her multiple pain issues regarding her back,  right knee, and left shoulder.  She is now tentatively scheduling right  total knee replacement after the New Year with Dr. [name].  She is having  similar back pain in the left-side as of late with spasms.  It seems to  be happening when she is bending and standing more so.  They have been  taking place for the 2 weeks.  Right knee remains painful.  She remains  on the Duragesic patch 100 mcg and Talwin q.8 h p.r.n.  Sleep is poor  sometimes due to pain, although she states that her cat wakes her up at  times.   REVIEW OF SYSTEMS:  Notable for spasm with nausea.  She also has some  sleep apnea, needs to have her CPAP mask refitted.  Full review is in  the health and history section in the chart.   SOCIAL HISTORY:  The patient is separated, nearly divorced at this  point.   PHYSICAL EXAMINATION:  VITAL SIGNS:  Blood pressure is 151/99, pulse is  75, respiratory rate 18, and she is saturating 98% on room air.  GENERAL:  The patient is pleasant, alert, and oriented x3.  EXTREMITIES:  Strength is generally stable at 4-5 in the arms and legs.  She has some pain inhibition still proximally in the left shoulder and  the right knee.  Back is notable for significant spasms, scar tissue on  the upper lumbar and lower thoracic segments.  She tends to stand with  dextroscoliosis from the sacrum to L3 and then again at T12-L1.  Right  knee was tender to touch but not examined in detail today.  There is  notable valgus formation there.  HEART:  Regular.  CHEST:  Clear.  ABDOMEN:  Soft and nontender.  NEUROLOGIC:  Cognitively, she is within normal limits.  Movement is  appropriate.  Cranial nerve exam is intact.   ASSESSMENT:  1. Chronic scoliosis status post multilevel fusion.  2. Osteoarthritis of right knee, awaiting knee replacement.  3. Left rotator cuff syndrome.  4. Myofascial spine pain.  5. Anxiety with  depression.  6. Insomnia.   PLAN:  1. After informed consent, we injected left T12-L1 levels with 2      separate trigger point injections consisting of 2 mL of 1%      lidocaine.  The patient tolerated well, encouraged regular      stretching and exercising.  I am hopeful that after her knee      surgery, that her posture will improve so much that will somewhat      improve her low back symptoms.  2. Refill Duragesic patch 100 mcg #10 and Talwin NX 1 q.8 h p.r.n.      #60.  3. We will see her back in 3 months in my clinic and 1 month with      nursing.      Meredith Staggers, M.D.  Electronically Signed     ZTS/MedQ  D:  07/22/2008 11:57:44  T:  07/22/2008 23:52:50  Job #:  [id number]   cc:   [name], M.D.  Fax: [phone]

## 2011-01-15 NOTE — Assessment & Plan Note (Signed)
[name] is here regarding her multiple pain issues including low back,  right knee, and left shoulder.  Left shoulder has been doing well until  a couple of weeks ago after the injection.  She is having a lot of  problems with the right knee causing pain with weightbearing.  She wears  her knee brace.  She is planning to speak with Dr. Louanne Phillips soon regarding  potential knee replacement surgery again.  She has complaints of low  back pain, particularly when she walks, bends, or stands for long  periods of time.  She is on Duragesic patch 100 mcg q.72h. with Talwin  q.8h. p.r.n. and Soma p.r.n. for spasm.  Sleep is poor at times due to  her pain, particularly in the right knee, which throbs at night.  The  patient describes the pain as burning, stabbing, tingling, and aching.  She uses the crutch in the morning when she first gets going.  She can  walk about 10 or 15 minutes without having to stop overall.   REVIEW OF SYSTEMS:  Notable for bladder control problems, weakness,  numbness, trouble walking, spasms, dizziness, depression, night sweats,  painful urination, constipation, limb swelling, and sleep apnea.  Full  review is in the written health and history section.   SOCIAL HISTORY:  The patient is separated, but living with a friend.   PHYSICAL EXAMINATION:  Blood pressure is 101/59, pulse 76, and  respiratory rate 18.  She is sating 99% on room air.  The patient is  pleasant, alert, and oriented x3.  She has rotator cuff impingement  signs in the left shoulder.  Strength is 4/5 in the left shoulder.  She  had some pain in the low back with extension and flexion.  She has a  significant dextroscoliosis from the sacrum to L3 and also again at T12-  L1.  Right knee is somewhat is tender to touch.  She is wearing her  arthritis brace today because she has notable valgus malformation in the  right knee even with the brace on.  HEART:  Regular.  CHEST:  Clear.  ABDOMEN:  Soft and  nontender.  PSYCH:  Mood was good.  Cognition was normal.  NEUROLOGIC:  Cranial nerve exam was intact for nerves II through XII.   ASSESSMENT:  1. Chronic scoliosis.  Status post multilevel fusion from lumbar 3 to      sacral 1 as well as T12-L1.  2. Osteoarthritis of the right knee.  3. Left rotator cuff syndrome.  4. Anxiety with depression.  5. Central pain.  6. Insomnia.   PLAN:  1. I asked the patient to speak with Dr. Louanne Phillips regarding the right      knee.  Even though she is young, her quality of life is diminished      quite a bit and I think she is at the point where she could benefit      from a knee replacement.  2. Continue fentanyl patch 100 mcg q.72h. with Soma for breakthrough      pain as well as Talwin.  3. Injected left shoulder via posterior approach with 40 mg of Kenalog      and 3 mL of 1% lidocaine.  The patient tolerated it well.  4. Discussed appropriate back exercise to work on core muscle      stretching and strengthening and stability.  5. We will see her back in 1 month with the nurse and 3 months with  me.      Jill Phillips, M.D.  Electronically Signed     ZTS/MedQ  D:  03/22/2008 10:30:32  T:  03/23/2008 04:34:02  Job #:  [id number]   cc:   Jill Phillips, M.D.  Fax: [phone]

## 2011-01-15 NOTE — Assessment & Plan Note (Signed)
Jill Phillips is back regarding her back and right knee issues.  Her back has  been a bit better recently.  She has had some symptoms of UTI, which has  not been followed up.  We checked a UA and C and S back in May, which  was negative.  She is worried because of the hot weather her patch has  been releasing a bit quicker and she is having some restless symptoms  the day her patch is due.  She also notes restless leg symptoms at night  that are preventing sleep.  She is wondering if her CPAP machine is  working properly.  Overall, the patient rates her pain at 6-7/10 and  described as aching.  Pain interferes with general activity, relations  with others, enjoyment of life on a moderate level.  She denies any  depression or anxiety.  She does report overall daytime fatigue.   REVIEW OF SYSTEMS:  Notable for the above as well as occasional spasms,  limb swelling, and wheezing.  Full 14-point review is in the written  health and history section of the chart.   SOCIAL HISTORY:  The patient is divorced and living with her friend  still.  She is still smoking daily.   PHYSICAL EXAMINATION:  VITAL SIGNS:  Blood pressure 163/96, pulse 70,  and respiratory rate 18.  She is sating 96% on room air.  GENERAL:  The patient is pleasant, alert, and oriented x3.  BACK:  She has some pain again more slightly to extension and flexion of  the back with dextroscoliosis at L1-L3 and T12-L1.  EXTREMITIES:  Knees are somewhat tender to still touch right more than  left.  No focal swelling was really appreciated today.  She has valgus  deformity at the right knee.  HEART:  Regular rate.  CHEST:  Clear.  ABDOMEN:  Soft, nontender.  NEUROLOGIC:  Cognitively, she is appropriate.   ASSESSMENT:  1. Chronic lumbar and thoracic dextroscoliosis.  2. Multilevel spinal fusion.  3. Osteoarthritis, right knee.  4. Urinary urgency and dysuria.  5. Insomnia and restless leg symptoms.   PLAN:  1. The patient has seen an  ENT already for her breathing issues and I      recommended following up with him regarding her CPAP setting and      set up.  2. We will check urinalysis and culture today.  The patient had seen a      gynecologist in the past, but he has passed away.  She should      follow up with her family physician regarding recommendations      there.  We will see what her urine culture looks like first.  I did      not start any empiric antibiotics today.  3. Refill Talwin NX #60 and Duragesic patch 100 mcg #10.  4. We will add ReQuip 0.5 mg one to two nightly for restless leg      syndrome.  5. I will see her back in about 3 months with nursing and followup in      1 months' time.      Meredith Staggers, M.D.  Electronically Signed     ZTS/MedQ  D:  04/18/2009 12:31:40  T:  04/18/2009 14:55:51  Job #:  [id number]   cc:   [name], M.D.  Fax: [phone]

## 2011-01-18 NOTE — Discharge Summary (Signed)
[facility]  Patient:    Jill Phillips, Jill Phillips Visit Number: [id number] MRN: [id number]          Service Type: SUR Location: 4W 0451 01 Attending Physician:  Starling Manns Dictated by:   Charlott Rakes, P.A. Admit Date:  05/27/2001 Discharge Date: 06/01/2001                             Discharge Summary  ADMISSION DIAGNOSES: 1. 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 2. Mitral valve prolapse. 3. Hypertension. 4. Reflux. 5. Hypercholesterolemia. 6. Restless leg syndrome.  DISCHARGE DIAGNOSES: 1. 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 2. Postlaminectomy syndrome, lumbar. 3. Mitral valve prolapse. 4. Hypertension. 5. Reflux. 6. Hypercholesterolemia. 7. Restless leg syndrome. 8. Mild post hemorrhagic anemia. 9. Mild hypokalemia, resolved.  PROCEDURE:  On May 27, 2001, the patient underwent posterior fusion T11-12 and pedicle screw instrumentation of T11-12 utilizing a local autogenous bone graft.  This was performed by Dr. Patrice Paradise assisted by Jenetta Loges, P.A.-C. under general anesthesia.  CONSULTATIONS:  None.  HISTORY:  Briefly, the patient is a 55 year old white female status post multiple lumbar laminectomies and fusion procedures.  She is also status post T11-12 thoracic diskectomy and fusion through a right T12 thoracotomy.  She has had continued chronic low back pain attributed to postlaminectomy syndrome and is followed by the [facility].  She had documented pseudoarthrosis at T11-12 by CT scan.  MRI showed end plate edema at [id number].  Her lumbar fusion was noted to be solid at L3 to sacrum.  It was felt she would benefit from further surgical intervention and was admitted for the procedure as stated above.  HOSPITAL COURSE:  Briefly, the patient tolerated the procedure without difficulty.  Postoperatively, neurovascular and motor function in the lower extremities was noted to be intact.  On the first postoperative day,  the patient was fitted with a Jewett hyperextension brace.  She was able to ambulate utilizing the brace without difficulty.  Prior to discharge, the patient was independent with ambulating utilizing a walker.  The patient was placed on NPO status until she was able to have flatus and bowel movement. This occurred on May 31, 2001, and the patient was then given a regular diet which she tolerated well.  Prior to discharge, her abdomen was soft, nontender with positive bowel sounds and she was voiding without difficulty as well.  She was on stool softeners throughout the hospital stay.  Occupational therapy assisted the patient with ADLs and she tolerated this well.  Dressing change was done on postoperative day #3, and the wound was found to be healing well.  She continued to have good wound healing throughout the hospital stay without signs of infectious process.  Foley catheter was discontinued and the patient was able to void without difficulty.  A urinalysis was negative.  The patient had mild hypokalemia on May 30, 2001, and was treated with supplementation.  Postoperatively, her hemoglobin dropped to the lowest value of 9.7 and she did not require blood transfusion.  The patient was able to utilize analgesics which she had been taking prior to her hospitalization. Being treated at the pain management clinic, she was given Duragesic patch. We continued the Duragesic patch and she also used Talwin NX, and Soma.  All other home medications were continued during the hospital stay.  On September room 30, 2002, the patient was comfortable and doing quite well with ambulation.  She was felt stable for  discharge to her home.  PERTINENT LABORATORY VALUES:  Urine culture showed no growth after her Foley catheter was discontinued.  Her preoperative urinalysis had been negative as well.  Chemistry studies on admission were within normal limits.  The patient had hypokalemia at 3.2 on  September 28, which was treated with oral supplementation.  CBC on admission was within normal limits.  Hemoglobin dropped to the lowest value of 9.8 postoperatively, and she did not require blood transfusion.  Coagulation studies on admission were normal.  Chest x-ray on admission showed no active disease.  EKG on admission showed normal sinus rhythm with sinus arrhythmia, no significant change since May 24, 2001, confirmed by Dr. Pauline Aus.  PLAN:  The patient is being discharged to her home.  There, she will continue to ambulate as tolerated and will avoid any lifting, bending, twisting, or squatting.  She will continue to wear a brace.  She has a corset-type brace and also an extension-type brace.  She will use whichever of these is more comfortable.  She will follow up with Dr. Patrice Paradise two weeks from the date of her surgery and has been advised to call the office to do so.  Prior to discharge, standing AP and lateral of the thoracic spine was ordered.  Results of these will be sent to Dr. Lorre Nick office and he will review these himself as well. She will continue at the [facility].  At home, she will continue to use her Duragesic patch.  She has been given a prescription for Talwin NX, #40, one q.4-6h. as needed for pain and Soma 350 mg, #30, one q.8h. as needed for spasm.  She will resume all other home medications as well.  She has been advised to watch her bowel pattern closely and to use laxative of choice as needed.  She will also use over-the-counter stool softeners.  The patient will be allowed to shower.  She will have a dressing change daily at home.  She will continue on a regular diet.  The patient will call the office if there are further questions or concerns prior to her return on this visit.  CONDITION ON DISCHARGE:  Stable. Dictated by:   Charlott Rakes, P.A. Attending Physician:  Starling Manns. DD:  06/01/01 TD:  06/01/01 Job: [phone] [id number]

## 2011-01-18 NOTE — H&P (Signed)
[facility]  Patient:    Jill Phillips, WELLENS Visit Number: [id number] MRN: [id number]          Service Type: Attending:  Starling Manns, M.D. Dictated by:   Alexzandrew L. Perkins, P.A.-C. Adm. Date:  05/27/01   CC:         Luster Landsberg, M.D., [place], [place]   History and Physical  DATE OF OFFICE VISIT AND HISTORY AND PHYSICAL:  05/21/01  CHIEF COMPLAINT:  Continued back pain.  HISTORY OF PRESENT ILLNESS:  The patient is a 55 year old female well known to [name], M.D., who was later referred over for evaluation by Starling Manns, M.D., for a possible pseudoarthrosis at T11-12.  She is post laminectomy syndrome.  She currently complains of disabling pain in the thoracolumbar region, as well as persistent lumbosacral pain.  She has had occasional radiation into the right flank and left groin.  She has had multiple back operations dating back to 69 by Laurice Record. [place], M.D., and then multiple surgeries with her most recent surgery a lumbar fusion with rod instrumentation back in 2000.  She currently denies any significant weakness and her balance is intact.  She has no bowel or bladder complaints, but she has had persistent pain.  She has been under pain management in the past.  She is seen in the office and found to have a well-healed thoracotomy incision at the T12 rib.  Her reflexes on the right are +1 at the knee and on the left knee of 0.  The ankle jerks are 0 on the right and +2 on the left.  She does have a positive straight leg raise on the right with reproduction of pain of the buttock.  Contralateral seated straight leg raise is negative.  Negative Babinskis and no clonus is noted.  X-rays were taken in the office and previous surgery and fusion sites were noted.  She was sent for a bone scan which did show significant increased uptake at the T11-12 region.  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   It was felt that she would benefit from undergoing surgical intervention.  The risks and benefits were discussed and she has elected to proceed with surgery.  She will be subsequently admitted to [facility]:  The patient has multiple allergies, including DARVOCET which causes itching, MACROBID which causes a rash, MORPHINE and DEMEROL cause itching, and SULFA causes ulcerations and break out in her mouth.  She has several intolerances, including CODEINE causes headaches and VICODIN, PERCOCET, and TYLOX also cause headache along with PARAFON FORTE.  CURRENT MEDICATIONS:  1. Duragesic patch 100 mg one every three days.  2. Talwin NX one or two every four to six hours as needed for pain.  3. Soma 350 mg one t.i.d. to q.i.d., which she also alternates sometimes     using Robaxin 750 mg one to two three times a day.  4. Quinine sulfate 260 mg at bedtime.  5. Effexor XR 75 mg one to two tablets daily.  6. Neurontin 300 mg up to three times a day as necessary.  7. Altace 5 mg daily.  8. Klonopin 0.5 mg from one to three tablets a day.  9. Ambien 10 mg at bedtime. 10. Clonidine 0.1 mg one to two tablets a day. 11. Pravachol 40 mg daily. 12. Tylenol as needed. 13. Two stool softeners daily. 14. Prevacid 30 mg daily.  PAST MEDICAL  HISTORY:  Significant for mitral valve prolapse, hypertension, reflux disease, elevated cholesterol, and restless leg syndrome.  PAST SURGICAL HISTORY:  Appendectomy in 1961, tubal ligation in 1978, partial hysterectomy in 1997, and right salpingectomy and oophorectomy in 1998.  She has had multiple back surgeries, including three lumbar laminectomies in 1987, 1990, and 1992 and subsequently three lumbar fusions.  She has also had one thoracic surgery in 1997 and another lumbar fusion with rods and instrumentation in 2000.  She has also had two foot surgeries in 1989 and 1999.  MEDICAL DOCTOR:  Luster Landsberg, M.D.  SOCIAL HISTORY:   She is married and has two children.  She is approximately a two to two and a half pack per day smoker.  No alcohol.  FAMILY HISTORY:  Mother living at age 82 with hypertension and kidney disease. Father deceased at age 8 with hypertension, heart disease, and heart failure.  REVIEW OF SYSTEMS:  General:  No fevers, chills, or night sweats.  Neurologic: She does have multiple headaches, etiology unknown.  She does have multiple medication intolerances which do cause headaches.  No seizures or syncope. Respiratory:  No shortness of breath, productive cough, or hemoptysis. Cardiovascular:  She occasionally has a "fast rate," however, she does not attribute this to any specific condition or event.  She has had no chest pains or orthopnea.  GI:  She does have chronic constipation.  She is on stool softeners and sometimes uses laxatives over the counter.  No diarrhea.  No blood or mucus in the stool.  No nausea or vomiting.  GU:  No dysuria, hematuria, or discharge.  Musculoskeletal:  Pertinent to the back and the legs found in the history of present illness.  PHYSICAL EXAMINATION:  VITAL SIGNS:  Pulse 74, respiratory rate 16, blood pressure 124/74.  GENERAL APPEARANCE:  The patient is a 55 year old white female, well nourished and well developed.  Appears to be in no acute distress.  She was alert, oriented, cooperative, and pleasant at the time of the exam.  HEENT:  Normocephalic and atraumatic.  Pupils round and reactive.  Oropharynx clear.  The patient does have partial upper dentures noted.  NECK:  Supple.  No carotid bruits are appreciated.  CHEST:  Clear to auscultation in anterior and posterior chest walls.  HEART:  Regular rate and rhythm.  No murmurs.  ABDOMEN:  Soft, slightly round, and nontender.  Bowel sounds are present.  RECTAL:  Not done, not pertinent to present illness.  BREASTS:  Not done, not pertinent to present illness.  GENITALIA:  Not done; not pertinent to  present illness.   EXTREMITIES AND BACK:  The patient has previous thoracotomy scars noted. Previous lumbar scars noted.  She does have a normal gait.  She does have some restricted range of motion in the upper trunk.  Motor function is intact to the lower extremities.  She has a positive straight leg raise on the right in the seated position for reproduction of buttock pain.  Contralateral seated straight leg raise on the left is negative.  The deep tendon reflexes are +1 right knee, 0 left knee, 0 right ankle, and +2 left ankle.  IMPRESSION: 1. Pseudoarthrosis, T11-12. 2. Mitral valve prolapse. 3. Hypertension. 4. Reflux. 5. Hypercholesterolemia. 6. Restless leg syndrome.  PLAN:  The patient will be admitted to [facility] to undergo a fusion at T11-12 per Starling Manns, M.D.  The surgery is to be performed on May 27, 2001.  The patient is  to donate one unit of packed red blood cells in preparation for the up and coming surgery. Dictated by:   Alexzandrew L. Perkins, P.A.-C. Attending:  Starling Manns, M.D. DD:  05/26/01 TD:  05/26/01 Job: [id number]

## 2011-01-18 NOTE — Op Note (Signed)
Jill Phillips, Jill Phillips                ACCOUNT NO.:  [id number]   MEDICAL RECORD NO.:  [id number]          PATIENT TYPE:  INP   LOCATION:  2899                         FACILITY:  [facility]   PHYSICIAN:  [name], M.D.   DATE OF BIRTH:  08-19-1956   DATE OF PROCEDURE:  02/11/2006  DATE OF DISCHARGE:                                 OPERATIVE REPORT   PREOPERATIVE DIAGNOSIS:  Retained spinal cord stimulator.  The leads at the  spinal cord level at the T8-9 level, the battery leads taking a loop at the  L2-3 level and the battery located over the left upper buttock in a subcu  area.   POSTOPERATIVE DIAGNOSIS:  Retained spinal cord stimulator.  The leads at the  spinal cord level at the T8-9 level, the battery leads taking a loop at the  L2-3 level and the battery located over the left upper buttock in a subcu  area.   OPERATION PERFORMED:  Removal of spinal cord stimulator and battery.  Battery removed from the left upper buttock and the leads then removed at  the L2-3 level through two incisions.   SURGEON:  [name], M.D.   ASSISTANT:  Epimenio Foot, P.A.   ANESTHESIA:  General orotracheal anesthesia, Dr. Chriss Driver.   SPECIMENS DELIVERED TO PATHOLOGY:  None.  [employer] battery and wires given  to the patient.   ESTIMATED BLOOD LOSS:  50 mL.   COMPLICATIONS:  None.   DRAINS:  Jackson-Pratt drain left upper buttock.   The patient returned to the PACU in good condition.   INDICATIONS FOR PROCEDURE:  The patient is a 55 year old female who has  undergone numerous lumbar surgeries.  She has had L3 to sacrum fusion with  various procedures performed.  She also has had a T11-T12 diskectomy via a  thoracotomy and eventually underwent fusion of this segment with pedicle  screw and rod placement.  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  She has been  seen and taken care of by Dr. Etter Sjogren in [facility]  at the [facility] there.  He has placed a spinal cord stimulator with the  leads at the T8-T9 level.  The spinal cord stimulator has not been used in  over a year and a half to two years by the patient.  She reports that it has  been ineffective in relieving discomfort since its insertion.  She desires  to have it removed as the battery is subcutaneous and irritates her whenever  she is lying or rolling on the left side upper buttock area.  We also  removed the leads as the patient requires further studies throughout her  lifetime including MRI studies.  With the leads out, then this will allow  for effective imaging as needed.   INTRAOPERATIVE FINDINGS:  As above.   DESCRIPTION OF PROCEDURE:  After adequate general anesthesia, the patient  placed in a Jackson table with chest rolls, the lower extremities well  padded, no Foley catheter.  Standard preoperative antibiotics with  Ancef.  All pressure points well padded.  TED hose were used for anti-DVT  prophylaxis.  PAS stockings on her tonight.  The area of her dorsal side  extending from about D5 all the way to the midsacral segment was then  prepped with DuraPrep solution and draped in the usual manner.  Iodine Vi-  drape was used.  The incision used for previous battery placement on the  left was ellipsed using a 10 blade scalpel and this was carried down to the  level of the battery.  The battery was then removed grasping with Kocher  clamp and removing it easily from its pouch.  The entire pouch with the  pseudomembrane was then excised back to normal fatty layer and subcutaneous  layers.  This was done using an excision technique with both 15 blade  scalpel as well as Metzenbaum scissors circumferentially removing this  pouch.  Next, an incision was made after first localizing spinal needles at  the expected upper level of L2-3 and just above this segment and also the  expected D9 and 8 level.  Spinal needles were  placed.  Intraoperative  radiograph demonstrated the needles at the aforementioned levels.  The  lowest needle just slightly above the area where the spinal cord stimulator  wires had made a loop and it required resection here.  The skin was then  carefully infiltrated with Marcaine 0.5% with 1:200,000 epinephrine.  The  skin area of previous incision was then ellipsed and incision carried  sharply down to the wires placed in the subcu skin area.  These wires were  then carefully unwrapped.  They were divided proximally and allowed to be  pulled through to the battery level below and were removed there.  The  sutures that were retained, plastic tubing that was holding the wires in  place subcu were removed using suture scissors and then the remaining wire  extending cranially to the dorsal level was then carefully snipped out of  the incision.  This was done without difficulty.  There was no hang up in  the removal of these cord stimulator probes or the electrodes at all.  Intraoperative lateral radiograph was then obtained demonstrating that the  entire wire and battery area was free of any remaining hardware.  With this  then the subcutaneous pocket of pseudomembrane was then resected at the area  just at the upper end of the T2-3 level.  Irrigation was performed and the  subcu area was closed using interrupted 0 Vicryl sutures.  More superficial  subcutaneous layer reapproximated with interrupted 2-0 Vicryl suture and  skin closed with a running subcu stitch of 4-0 Vicryl.  The left buttock  area was irrigated and then the subcu layers were carefully closed after  placement of a 10 French J-P through a separate stab incision over the  lateral aspect of the buttock.  This drain was placed in the depth of the  incision in order to drain the dead space placed here.  The more superficial  layers were then tacked down to the deeper layers using interrupted 0 Vicryl sutures.  Then the incision  was closed by approximating the subcu layers  with interrupted 0 Vicryl sutures, more superficial layers with interrupted  2-0 Vicryl sutures and the skin closed with a running subcu stitch of 4-0  Vicryl.  Dermabond was then applied to the midline incision at the L10-L2  level and over the left upper buttock sealing the skin areas nicely.  4 x  4s, ABD pad and fixed to the skin over the left upper buttock.  4 x 4s, then  HypaFix tape then used to dress the incision at the L1-L2 level of  approximately an inch and a half in length there.  The patient was then  returned to a supine position, reactivated, extubated and returned to the  recovery room in satisfactory condition.  All instrument and sponge counts  were correct.      [name], M.D.  Electronically Signed     JEN/MEDQ  D:  02/11/2006  T:  02/11/2006  Job:  [id number]

## 2011-02-07 ENCOUNTER — Encounter: Payer: Medicare HMO | Attending: Physical Medicine & Rehabilitation

## 2011-02-07 DIAGNOSIS — F341 Dysthymic disorder: Secondary | ICD-10-CM

## 2011-02-07 DIAGNOSIS — M1710 Unilateral primary osteoarthritis, unspecified knee: Secondary | ICD-10-CM

## 2011-02-07 DIAGNOSIS — M67919 Unspecified disorder of synovium and tendon, unspecified shoulder: Secondary | ICD-10-CM | POA: Insufficient documentation

## 2011-02-07 DIAGNOSIS — G2581 Restless legs syndrome: Secondary | ICD-10-CM | POA: Insufficient documentation

## 2011-02-07 DIAGNOSIS — M7530 Calcific tendinitis of unspecified shoulder: Secondary | ICD-10-CM

## 2011-02-07 DIAGNOSIS — IMO0002 Reserved for concepts with insufficient information to code with codable children: Secondary | ICD-10-CM

## 2011-02-07 DIAGNOSIS — G8929 Other chronic pain: Secondary | ICD-10-CM | POA: Insufficient documentation

## 2011-02-07 DIAGNOSIS — M961 Postlaminectomy syndrome, not elsewhere classified: Secondary | ICD-10-CM

## 2011-02-07 DIAGNOSIS — M25519 Pain in unspecified shoulder: Secondary | ICD-10-CM | POA: Insufficient documentation

## 2011-02-07 DIAGNOSIS — M719 Bursopathy, unspecified: Secondary | ICD-10-CM | POA: Insufficient documentation

## 2011-02-07 DIAGNOSIS — Z96649 Presence of unspecified artificial hip joint: Secondary | ICD-10-CM | POA: Insufficient documentation

## 2011-02-07 DIAGNOSIS — R609 Edema, unspecified: Secondary | ICD-10-CM | POA: Insufficient documentation

## 2011-03-12 ENCOUNTER — Encounter: Payer: Medicare HMO | Attending: Physical Medicine & Rehabilitation | Admitting: Physical Medicine & Rehabilitation

## 2011-03-12 DIAGNOSIS — F341 Dysthymic disorder: Secondary | ICD-10-CM

## 2011-03-12 DIAGNOSIS — IMO0002 Reserved for concepts with insufficient information to code with codable children: Secondary | ICD-10-CM

## 2011-03-12 DIAGNOSIS — M67919 Unspecified disorder of synovium and tendon, unspecified shoulder: Secondary | ICD-10-CM | POA: Insufficient documentation

## 2011-03-12 DIAGNOSIS — F17200 Nicotine dependence, unspecified, uncomplicated: Secondary | ICD-10-CM | POA: Insufficient documentation

## 2011-03-12 DIAGNOSIS — M961 Postlaminectomy syndrome, not elsewhere classified: Secondary | ICD-10-CM

## 2011-03-12 DIAGNOSIS — M719 Bursopathy, unspecified: Secondary | ICD-10-CM | POA: Insufficient documentation

## 2011-03-12 DIAGNOSIS — M545 Low back pain, unspecified: Secondary | ICD-10-CM | POA: Insufficient documentation

## 2011-03-12 DIAGNOSIS — G2581 Restless legs syndrome: Secondary | ICD-10-CM | POA: Insufficient documentation

## 2011-03-12 DIAGNOSIS — J4489 Other specified chronic obstructive pulmonary disease: Secondary | ICD-10-CM | POA: Insufficient documentation

## 2011-03-12 DIAGNOSIS — M1710 Unilateral primary osteoarthritis, unspecified knee: Secondary | ICD-10-CM

## 2011-03-12 DIAGNOSIS — R350 Frequency of micturition: Secondary | ICD-10-CM | POA: Insufficient documentation

## 2011-03-12 DIAGNOSIS — G8929 Other chronic pain: Secondary | ICD-10-CM | POA: Insufficient documentation

## 2011-03-12 DIAGNOSIS — J449 Chronic obstructive pulmonary disease, unspecified: Secondary | ICD-10-CM | POA: Insufficient documentation

## 2011-03-12 DIAGNOSIS — N390 Urinary tract infection, site not specified: Secondary | ICD-10-CM

## 2011-03-12 DIAGNOSIS — R32 Unspecified urinary incontinence: Secondary | ICD-10-CM | POA: Insufficient documentation

## 2011-03-12 NOTE — Assessment & Plan Note (Signed)
HISTORY:  Remo Lipps is back regarding her chronic pain.  Her knee brace is functioning fairly well for except the Velcro is coming loose on the upper aspect.  She wears the brace daily.  We increased her Mirapex effects recently as she had increasing restless leg symptoms and this has worked better at the 0.5 mg dose.  She complaints of urinary frequency over the last 2 or 3 weeks and some incontinence, particularly at nighttime.  She notes no medication changes.  She had sleep testing done and she had no frank sleep apnea but she was found to have some hypoxemia and was placed on oxygen at night for sleep.  She rates her pain at 4-5/10 overall.  REVIEW OF SYSTEMS:  Notable for occasional spasms, constipation.  She is working on a regimen for this.  Full 12-point review is in the written health and history section of the chart.  SOCIAL HISTORY:  The patient is divorced, living alone.  She still smoking two packs of cigarettes per day.  PHYSICAL EXAMINATION:  VITAL SIGNS:  Blood pressure 161/96, pulse 93, respiratory rate 18, and she is satting 98% on room air. GENERAL:  The patient is pleasant, alert, oriented x3. NEUROLOGIC:  Affect showing bright and appropriate.  She has intact strength in all four limbs.  She is wearing her right knee brace and seems to be fitting appropriately.  The Velcro is becoming loose.  She walks with antalgia favoring the right side but is better stability with the brace on that before.  Upper extremity strength generally functional.  She is not favoring the right shoulder today.  Breathing was comfortable.  Cognitively, she is alert and appropriate.  ASSESSMENT: 1. Chronic lumbar spine pain. 2. Osteoarthritis of the knees with right knee brace. 3. Right rotator cuff syndrome with bursitis. 4. History of hip fracture. 5. Restless legs syndrome. 6. Chronic obstructive pulmonary disease.  PLAN: 1. Refill fentanyl patches 100 mcg q.72 h, #10 as well as  Talwin NX 1     p.o. q.8 h. p.r.n. #60. 2. We will check urinalysis and culture today as symptoms are     suspicious for UTI. 3. No other medication changes were made today. 4. Recommended the patient's followup regarding blood pressure, has     been on occasion elevated here at this office but routinely has     been within normal limits.     Meredith Staggers, M.D. Electronically Signed    ZTS/MedQ D:  03/12/2011 11:56:46  T:  03/12/2011 14:27:05  Job #:  [id number]

## 2011-03-13 ENCOUNTER — Ambulatory Visit: Payer: Medicare HMO | Admitting: Physical Medicine & Rehabilitation

## 2011-03-15 ENCOUNTER — Ambulatory Visit: Payer: Medicare HMO | Admitting: Physical Medicine & Rehabilitation

## 2011-04-09 ENCOUNTER — Encounter: Payer: Medicare HMO | Attending: Neurosurgery | Admitting: Neurosurgery

## 2011-04-09 DIAGNOSIS — G894 Chronic pain syndrome: Secondary | ICD-10-CM | POA: Insufficient documentation

## 2011-04-09 DIAGNOSIS — G2581 Restless legs syndrome: Secondary | ICD-10-CM | POA: Insufficient documentation

## 2011-04-09 DIAGNOSIS — J4489 Other specified chronic obstructive pulmonary disease: Secondary | ICD-10-CM | POA: Insufficient documentation

## 2011-04-09 DIAGNOSIS — M25569 Pain in unspecified knee: Secondary | ICD-10-CM | POA: Insufficient documentation

## 2011-04-09 DIAGNOSIS — M67919 Unspecified disorder of synovium and tendon, unspecified shoulder: Secondary | ICD-10-CM | POA: Insufficient documentation

## 2011-04-09 DIAGNOSIS — M1710 Unilateral primary osteoarthritis, unspecified knee: Secondary | ICD-10-CM | POA: Insufficient documentation

## 2011-04-09 DIAGNOSIS — M545 Low back pain, unspecified: Secondary | ICD-10-CM | POA: Insufficient documentation

## 2011-04-09 DIAGNOSIS — J449 Chronic obstructive pulmonary disease, unspecified: Secondary | ICD-10-CM | POA: Insufficient documentation

## 2011-04-09 DIAGNOSIS — M719 Bursopathy, unspecified: Secondary | ICD-10-CM | POA: Insufficient documentation

## 2011-04-11 ENCOUNTER — Encounter: Payer: Medicare HMO | Admitting: Neurosurgery

## 2011-04-11 DIAGNOSIS — M25569 Pain in unspecified knee: Secondary | ICD-10-CM

## 2011-04-11 DIAGNOSIS — M25529 Pain in unspecified elbow: Secondary | ICD-10-CM

## 2011-04-11 DIAGNOSIS — G894 Chronic pain syndrome: Secondary | ICD-10-CM

## 2011-04-11 NOTE — Assessment & Plan Note (Signed)
Account [id number].  This is a patient Dr. Naaman Plummer who returns today regarding her chronic pain syndrome.  She reports no change in her pain.  She is being treated for her left knee pain by Dr. [name] who has injected her recently.  She has declined any kind surgery at this time and is trying to treat that conservatively, otherwise she has no changes in her condition.  She rates her average pain at 4-5.  It is a stabbing type pain.  Her general activity level is 2/6.  Her pain is worse in the morning, daytime and in the evening.  Sleep patterns are poor.  Walking and most activities aggravate.  Rest, heat, medication, and injections tend to help.  She walks without assistance except for her brace.  She does not climb steps.  She does drive.  Functionality, she is on disability.  REVIEW OF SYSTEMS:  Notable for the difficulties described above as well as some bladder control issues and spasms, otherwise no problems with her urinary tract.  PAST MEDICAL HISTORY:  Unchanged.  SOCIAL HISTORY:  She is divorced.  She lives alone.  FAMILY HISTORY:  Unchanged.  PHYSICAL EXAMINATION:  VITAL SIGNS:  Her blood pressure is 119/71, pulse 92, respirations 16, O2 sats 95 on room air.  Constitutionally, she is within normal limits.  She is alert and oriented x3.  Her motor strength is good in her lower extremities.  She does give way to confrontation testing due to pain.  Her sensation is intact in the upper and lower extremities.  Her Oswestry score today is 42.  ASSESSMENT: 1. Chronic lumbar pain. 2. Osteoarthritis of the knees right knee brace in place. 3. Rotator cuff syndrome. 4. Restless leg. 5. Chronic obstructive pulmonary disease.  PLAN: 1. Refill Talwin NX 1 p.o. q.8 h. p.r.n., 60, with no refill. 2. Duragesic transdermal 100 mcg apply one transdermally q.72 h. 10     with no refill. 3. Follow up with Dr. [name] is ordered for her knee, and a she will     follow up with Dr. Naaman Plummer in 1  month.  Her questions were     encouraged and answered.     Robbye Dede L. Blenda Nicely Electronically Signed    RLW/MedQ D:  04/11/2011 12:30:57  T:  04/11/2011 21:11:47  Job #:  [id number]

## 2011-05-10 ENCOUNTER — Encounter: Payer: Medicare HMO | Attending: Physical Medicine & Rehabilitation | Admitting: Physical Medicine & Rehabilitation

## 2011-05-10 DIAGNOSIS — F341 Dysthymic disorder: Secondary | ICD-10-CM

## 2011-05-10 DIAGNOSIS — M67919 Unspecified disorder of synovium and tendon, unspecified shoulder: Secondary | ICD-10-CM | POA: Insufficient documentation

## 2011-05-10 DIAGNOSIS — M545 Low back pain, unspecified: Secondary | ICD-10-CM | POA: Insufficient documentation

## 2011-05-10 DIAGNOSIS — L989 Disorder of the skin and subcutaneous tissue, unspecified: Secondary | ICD-10-CM | POA: Insufficient documentation

## 2011-05-10 DIAGNOSIS — M7530 Calcific tendinitis of unspecified shoulder: Secondary | ICD-10-CM

## 2011-05-10 DIAGNOSIS — G2581 Restless legs syndrome: Secondary | ICD-10-CM | POA: Insufficient documentation

## 2011-05-10 DIAGNOSIS — M1710 Unilateral primary osteoarthritis, unspecified knee: Secondary | ICD-10-CM | POA: Insufficient documentation

## 2011-05-10 DIAGNOSIS — G8929 Other chronic pain: Secondary | ICD-10-CM | POA: Insufficient documentation

## 2011-05-10 DIAGNOSIS — M961 Postlaminectomy syndrome, not elsewhere classified: Secondary | ICD-10-CM

## 2011-05-10 DIAGNOSIS — Z9981 Dependence on supplemental oxygen: Secondary | ICD-10-CM | POA: Insufficient documentation

## 2011-05-10 DIAGNOSIS — G4733 Obstructive sleep apnea (adult) (pediatric): Secondary | ICD-10-CM | POA: Insufficient documentation

## 2011-05-10 DIAGNOSIS — M719 Bursopathy, unspecified: Secondary | ICD-10-CM | POA: Insufficient documentation

## 2011-05-10 DIAGNOSIS — F17200 Nicotine dependence, unspecified, uncomplicated: Secondary | ICD-10-CM | POA: Insufficient documentation

## 2011-05-10 NOTE — Assessment & Plan Note (Signed)
HISTORY:  Jill Phillips is back regarding her chronic pain syndrome.  Her right knee has been doing fairly well with the osteoarthritis brace.  She also often goes there to without the brace and then when her pain flares, she puts it on.  Complains of cyst or drainage around her left nostril that has been concerning and a problem for the last 3 weeks.  Restless leg symptoms have been better with the ReQuip.  She rates her pain 3-4/10 today.  REVIEW OF SYSTEMS:  Notable for the above.  Full 12-point review is in the written health and history section of the chart.  SOCIAL HISTORY:  Unchanged.  She is divorced.  She does note that her brother died 15 months ago from a heart attack.  PHYSICAL EXAMINATION:  VITAL SIGNS:  Blood pressure 105/64, pulse 92, respiratory rate 18, and she is satting 92% on room air. GENERAL:  The patient is pleasant and alert.  Room smells of cigarettes upon entering.  She ambulates with a slight antalgia to the right side. Otherwise, gait is stable.  I examined the area on left nose and the area appeared to be slightly chafed.  There is no obvious pocket or sac there.  She was wearing makeup around and over the area to0o. HEART:  Regular rate. CHEST:  Clear. ABDOMEN:  Soft and nontender. COGNITIVE:  She is intact.  ASSESSMENT: 1. Chronic lumbar spine pain. 2. Osteoarthritis of the right greater than left knees. 3. Rotator cuff syndrome. 4. Restless legs syndrome. 5. History of obstructive sleep apnea and O2 dependence.  The patient     is also a heavy smoker.  PLAN: 1. Given the patient's family history of cardiac disease, I told her     would be wise to follow up with her family doctor in this regard.     She smokes heavily additionally. 2. Continue with osteoarthritis brace, this seems to be working fairly     well. 3. Regarding wound on the left nostril area.  This may be a small     sebaceous cyst.  I told her to keep the area dry.  She needs to     stay  free of lotions or creams, especially makeup.  I would stick     with alcohol and/or peroxide for now and place a small square of     gauze in the area keep it dry.  This should heal up on its own. 4. Refilled her fentanyl patch 100 mcg q.72 h. #10 and Talwin NX 1 q.8     h. p.r.n. #60. 5. I will see her back in about 6 months.  She will follow up with my     nurse practitioner in 1 month.     Meredith Staggers, M.D. Electronically Signed   ZTS/MedQ D:  05/10/2011 12:12:55  T:  05/10/2011 15:35:20  Job #:  [id number]  cc:   Marrianne Mood, MD Fax: [phone]

## 2011-05-14 ENCOUNTER — Ambulatory Visit: Payer: Medicare HMO | Admitting: Physical Medicine & Rehabilitation

## 2011-06-06 ENCOUNTER — Encounter: Payer: Medicare HMO | Attending: Neurosurgery | Admitting: Neurosurgery

## 2011-06-06 DIAGNOSIS — M1710 Unilateral primary osteoarthritis, unspecified knee: Secondary | ICD-10-CM | POA: Insufficient documentation

## 2011-06-06 DIAGNOSIS — G2581 Restless legs syndrome: Secondary | ICD-10-CM | POA: Insufficient documentation

## 2011-06-06 DIAGNOSIS — M25569 Pain in unspecified knee: Secondary | ICD-10-CM | POA: Insufficient documentation

## 2011-06-06 DIAGNOSIS — Z87891 Personal history of nicotine dependence: Secondary | ICD-10-CM | POA: Insufficient documentation

## 2011-06-06 DIAGNOSIS — M545 Low back pain, unspecified: Secondary | ICD-10-CM | POA: Insufficient documentation

## 2011-06-06 DIAGNOSIS — M719 Bursopathy, unspecified: Secondary | ICD-10-CM | POA: Insufficient documentation

## 2011-06-06 DIAGNOSIS — G8929 Other chronic pain: Secondary | ICD-10-CM | POA: Insufficient documentation

## 2011-06-06 DIAGNOSIS — G4730 Sleep apnea, unspecified: Secondary | ICD-10-CM | POA: Insufficient documentation

## 2011-06-06 DIAGNOSIS — M25529 Pain in unspecified elbow: Secondary | ICD-10-CM

## 2011-06-06 DIAGNOSIS — M67919 Unspecified disorder of synovium and tendon, unspecified shoulder: Secondary | ICD-10-CM | POA: Insufficient documentation

## 2011-06-06 DIAGNOSIS — G894 Chronic pain syndrome: Secondary | ICD-10-CM

## 2011-06-20 ENCOUNTER — Other Ambulatory Visit: Payer: Self-pay

## 2011-07-02 ENCOUNTER — Encounter: Payer: Medicare HMO | Attending: Neurosurgery | Admitting: Neurosurgery

## 2011-07-02 DIAGNOSIS — G4730 Sleep apnea, unspecified: Secondary | ICD-10-CM | POA: Insufficient documentation

## 2011-07-02 DIAGNOSIS — G8929 Other chronic pain: Secondary | ICD-10-CM | POA: Insufficient documentation

## 2011-07-02 DIAGNOSIS — Z87891 Personal history of nicotine dependence: Secondary | ICD-10-CM | POA: Insufficient documentation

## 2011-07-02 DIAGNOSIS — M545 Low back pain, unspecified: Secondary | ICD-10-CM | POA: Insufficient documentation

## 2011-07-02 DIAGNOSIS — M75100 Unspecified rotator cuff tear or rupture of unspecified shoulder, not specified as traumatic: Secondary | ICD-10-CM

## 2011-07-02 DIAGNOSIS — IMO0002 Reserved for concepts with insufficient information to code with codable children: Secondary | ICD-10-CM

## 2011-07-02 DIAGNOSIS — M1710 Unilateral primary osteoarthritis, unspecified knee: Secondary | ICD-10-CM | POA: Insufficient documentation

## 2011-07-02 DIAGNOSIS — M67919 Unspecified disorder of synovium and tendon, unspecified shoulder: Secondary | ICD-10-CM | POA: Insufficient documentation

## 2011-07-02 DIAGNOSIS — M719 Bursopathy, unspecified: Secondary | ICD-10-CM | POA: Insufficient documentation

## 2011-07-02 DIAGNOSIS — G2581 Restless legs syndrome: Secondary | ICD-10-CM | POA: Insufficient documentation

## 2011-07-02 DIAGNOSIS — G894 Chronic pain syndrome: Secondary | ICD-10-CM

## 2011-07-02 DIAGNOSIS — M25569 Pain in unspecified knee: Secondary | ICD-10-CM | POA: Insufficient documentation

## 2011-07-02 NOTE — Assessment & Plan Note (Signed)
HISTORY OF PRESENT ILLNESS:  This is a patient of Dr. Ephriam Knuckles who is seen for continued chronic pain as well as some knee pain.  She reports no change in her pain.  She does have increasing restless leg problems from time to time.  She already has a medicine for that.  She rates her average pain at 3.  It is a dull pain with muscle spasms.  General activity level is zero.  Pain is worse in the evening.  Sleep patterns are poor.  Pain is worse walking, activity.  Rest, medication tend to help.  She walks without assistance.  She climb steps and drive.  She can walk about 10 minutes at a time.  She is on disability needing some help with household duties.  REVIEW OF SYSTEMS:  Notable for difficulties as described above as well as some weight fluctuations, blood sugar fluctuations, nausea, and spasms.  No suicidal thoughts or aberrant behaviors.  Last UDS consistent.  Pill count is correct.  PAST MEDICAL HISTORY/SOCIAL HISTORY/FAMILY HISTORY:  Unchanged.  PHYSICAL EXAMINATION:  VITAL SIGNS:  Blood pressure 121/74, pulse 95, respirations 18, O2 sats 97 on room air. NEUROLOGIC:  Motor strength and sensation intact.  Constitutionally, she is within normal limits.  She is alert and oriented x3.  She has a normal gait.  ASSESSMENT: 1. Chronic lumbar spine pain. 2. Osteoarthritis of the knees bilaterally. 3. Rotator cuff syndrome. 4. Restless legs syndrome. 5. O2 dependent sleep apnea. 6. History of tobacco abuse.  PLAN: 1. Refill Talwin NX 1 p.o. q.8 hours p.r.n. 60 with no refill. 2. Duragesic 100 mcg transdermally every 72 hours, 10 with no refill.     Her questions were encouraged and answered.  We will see her back     in the clinic in a month.     Aanyah Loa L. Blenda Nicely Electronically Signed    RLW/MedQ D:  07/02/2011 10:48:28  T:  07/02/2011 13:38:56  Job #:  [id number]

## 2011-07-04 ENCOUNTER — Encounter: Payer: Medicare HMO | Admitting: Neurosurgery

## 2011-07-30 ENCOUNTER — Encounter: Payer: Medicare HMO | Attending: Neurosurgery | Admitting: Neurosurgery

## 2011-07-30 DIAGNOSIS — G2581 Restless legs syndrome: Secondary | ICD-10-CM | POA: Insufficient documentation

## 2011-07-30 DIAGNOSIS — M25569 Pain in unspecified knee: Secondary | ICD-10-CM | POA: Insufficient documentation

## 2011-07-30 DIAGNOSIS — M545 Low back pain, unspecified: Secondary | ICD-10-CM

## 2011-07-30 DIAGNOSIS — F17200 Nicotine dependence, unspecified, uncomplicated: Secondary | ICD-10-CM | POA: Insufficient documentation

## 2011-07-30 DIAGNOSIS — M1710 Unilateral primary osteoarthritis, unspecified knee: Secondary | ICD-10-CM

## 2011-07-30 DIAGNOSIS — M79609 Pain in unspecified limb: Secondary | ICD-10-CM | POA: Insufficient documentation

## 2011-07-30 DIAGNOSIS — M719 Bursopathy, unspecified: Secondary | ICD-10-CM | POA: Insufficient documentation

## 2011-07-30 DIAGNOSIS — G894 Chronic pain syndrome: Secondary | ICD-10-CM

## 2011-07-30 DIAGNOSIS — G4730 Sleep apnea, unspecified: Secondary | ICD-10-CM | POA: Insufficient documentation

## 2011-07-30 DIAGNOSIS — M25559 Pain in unspecified hip: Secondary | ICD-10-CM | POA: Insufficient documentation

## 2011-07-30 DIAGNOSIS — G8929 Other chronic pain: Secondary | ICD-10-CM | POA: Insufficient documentation

## 2011-07-30 DIAGNOSIS — M67919 Unspecified disorder of synovium and tendon, unspecified shoulder: Secondary | ICD-10-CM | POA: Insufficient documentation

## 2011-07-30 NOTE — Assessment & Plan Note (Signed)
This is a patient Dr. Mardella Layman who is seen for chronic back, hip, and lower extremity pain.  She reports no change in her pain.  It is a 7. It is an aching-type pain.  She does report some increasing restless legs syndrome.  General activity level is a 3.  Pain is same 24 hours a day.  Sleep patterns are fair.  Pain is worse with standing and activity, rest, and medication help some.  She walks without assistance. She can walk about 20 minutes at a time.  She does climb steps.  She is on disability.  REVIEW OF SYSTEMS:  Notable for the difficulties described above as well as some low blood sugars and increased RLS symptoms.  No suicidal thoughts or aberrant behaviors.  Last pill count and UDS consistent.  PAST MEDICAL HISTORY/SOCIAL HISTORY/FAMILY HISTORY:  Unchanged.  PHYSICAL EXAM:  Blood pressure is 130/75, pulse 95, respirations 20, O2 sat 97 on room air.  Motor strength and sensation are intact. Constitutionally, she is within normal limits.  She is alert and oriented x3.  She has slight limp to her gait.  ASSESSMENT: 1. Chronic lumbar spine pain. 2. Osteoarthritis of the knees bilaterally. 3. Rotator cuff syndrome. 4. Restless legs syndrome. 5. O2-dependent sleep apnea. 6. Tobacco abuse.  PLAN: 1. Refill Talwin NX 1 p.o. q.8 hours p.r.n., #60 with no refill and     Duragesic 100 mcg 1 transdermally every 72 hours, #10 with no     refill. 2. We will stop Mirapex and start Klonopin 1 mg 1 p.o. nightly p.r.n.,     #30 with no refills.  Questions were encouraged and answered.  We     will see her back in a month.     Drakkar Medeiros L. Blenda Nicely Electronically Signed    RLW/MedQ D:  07/30/2011 10:39:04  T:  07/30/2011 13:48:39  Job #:  [id number]

## 2011-08-28 ENCOUNTER — Encounter: Payer: Medicare HMO | Attending: Physical Medicine & Rehabilitation | Admitting: Physical Medicine & Rehabilitation

## 2011-08-28 DIAGNOSIS — M79609 Pain in unspecified limb: Secondary | ICD-10-CM | POA: Insufficient documentation

## 2011-08-28 DIAGNOSIS — M545 Low back pain, unspecified: Secondary | ICD-10-CM | POA: Insufficient documentation

## 2011-08-28 DIAGNOSIS — G8929 Other chronic pain: Secondary | ICD-10-CM | POA: Insufficient documentation

## 2011-08-28 DIAGNOSIS — F17200 Nicotine dependence, unspecified, uncomplicated: Secondary | ICD-10-CM | POA: Insufficient documentation

## 2011-08-28 DIAGNOSIS — F341 Dysthymic disorder: Secondary | ICD-10-CM

## 2011-08-28 DIAGNOSIS — M719 Bursopathy, unspecified: Secondary | ICD-10-CM | POA: Insufficient documentation

## 2011-08-28 DIAGNOSIS — M67919 Unspecified disorder of synovium and tendon, unspecified shoulder: Secondary | ICD-10-CM | POA: Insufficient documentation

## 2011-08-28 DIAGNOSIS — G2581 Restless legs syndrome: Secondary | ICD-10-CM | POA: Insufficient documentation

## 2011-08-28 DIAGNOSIS — M1710 Unilateral primary osteoarthritis, unspecified knee: Secondary | ICD-10-CM

## 2011-08-28 DIAGNOSIS — G4730 Sleep apnea, unspecified: Secondary | ICD-10-CM | POA: Insufficient documentation

## 2011-08-28 DIAGNOSIS — M961 Postlaminectomy syndrome, not elsewhere classified: Secondary | ICD-10-CM

## 2011-08-28 DIAGNOSIS — G894 Chronic pain syndrome: Secondary | ICD-10-CM

## 2011-08-28 NOTE — Assessment & Plan Note (Signed)
Jill Phillips is back regarding her multiple pain complaints.  The biggest complaint is pain in her legs and feet related to her arthritis and restless legs.  We have been working on adjusting medications.  She has been on Klonopin most recently and has helped somewhat, but still she is having pain at night that often keep her awake.  She has come off the Mirapex due to the side effects including decreased arousal levels and confusion.  She is taking 2 mg of Klonopin at night currently.  Her biggest areas seem to be the ankles and knees, which is where the restless legs symptoms seem to manifest the most.  REVIEW OF SYSTEMS:  Notable for the above.  She does have occasional spasms.  Full 12-point review is in the written health and history section of the chart.  SOCIAL HISTORY:  Unchanged.  She is still smoking about a pack and half a day.  PHYSICAL EXAMINATION:  VITAL SIGNS:  Blood pressure is 121/73, pulse is 104, respiratory rate 18, she is satting 96% on room air.  GENERAL: Patient is pleasant, alert.  She stood for me today without significant effort.  She bent at the waist to about 90 degrees with minimal pain. She had crepitus in the right knee and did have her knee brace in place. Left knee is fully mobile with no crepitus or limitation as noted.  No edema was seen in either leg today, although right knee was a bit sclerotic.  The knee instability was seen on the left side.  Strength in lower extremities grossly 4 to 5 out of 5 with normal range of motion distally.  Sensory exam is intact.  ASSESSMENT: 1. Chronic lumbar spine pain. 2. Osteoarthritis of the knees bilaterally, right greater than left. 3. Rotator cuff syndrome. 4. Restless legs syndrome. 5. O2 dependent sleep apnea. 6. Tobacco abuse.  PLAN: 1. Again reviewed the importance of tobacco cessation here given her     multiple issues. 2. We will continue with Klonopin and Neurontin low-dose 300 mg at     bedtime for  restless leg symptoms and sleep. 3. The patient needs to resume her Voltaren gel and place this on her     knees and feet at nighttime. 4. Continue activity to tolerance. 5. Refill Talwin NX 1 q.8 hours p.r.n. #60 and Duragesic 100 mcg 1     q.72h hours #10 each without a refill. 6. She will see my nurse practitioner next month.     Meredith Staggers, M.D. Electronically Signed    ZTS/MedQ D:  08/28/2011 09:56:03  T:  08/28/2011 22:06:00  Job #:  [id number]

## 2011-09-26 ENCOUNTER — Encounter: Payer: Medicare HMO | Attending: Neurosurgery | Admitting: Neurosurgery

## 2011-09-26 DIAGNOSIS — G8929 Other chronic pain: Secondary | ICD-10-CM | POA: Insufficient documentation

## 2011-09-26 DIAGNOSIS — M1710 Unilateral primary osteoarthritis, unspecified knee: Secondary | ICD-10-CM

## 2011-09-26 DIAGNOSIS — G894 Chronic pain syndrome: Secondary | ICD-10-CM

## 2011-09-26 DIAGNOSIS — M25569 Pain in unspecified knee: Secondary | ICD-10-CM | POA: Insufficient documentation

## 2011-09-26 DIAGNOSIS — M545 Low back pain, unspecified: Secondary | ICD-10-CM

## 2011-09-26 DIAGNOSIS — M25519 Pain in unspecified shoulder: Secondary | ICD-10-CM | POA: Insufficient documentation

## 2011-09-26 NOTE — Assessment & Plan Note (Signed)
This is a patient of Dr. Charm Barges with multiple pain complaints of shoulder and right knee.  She rates her pain at a 3.  General activity level is a 2 to a 4.  Pain is worse in the morning.  Sleep patterns are fair.  She does not indicate what worsens or helps.  Mobility, she is independent.  She climbs steps and drives.  Functionally, she is on disability.  REVIEW OF SYSTEMS:  Notable for difficulties described above, otherwise unremarkable.  PAST MEDICAL HISTORY:  Unchanged.  SOCIAL HISTORY:  Unchanged.  FAMILY HISTORY:  Unchanged.  Past UDS and pill counts correct.  PHYSICAL EXAMINATION:  VITAL SIGNS:  Blood pressure is 120/77, pulse 83, respirations 14, O2 sats 94 on room air. NEUROLOGIC:  Motor strength and sensation are intact. CONSTITUTIONAL:  She is within normal limits.  She is alert and oriented x3.  She has normal gait.  ASSESSMENT: 1. Chronic lumbar spine pain. 2. Osteoarthritis of the knees bilaterally.  PLAN:  Refill Duragesic 100 mcg once transdermally every 72 hours, 10 with no refill.  Her questions were encouraged and answered.  She will follow up in 1 month.     Jun Rightmyer L. Blenda Nicely Electronically Signed    RLW/MedQ D:  09/26/2011 11:18:08  T:  09/26/2011 20:59:45  Job #:  [id number]

## 2011-10-25 ENCOUNTER — Encounter: Payer: Self-pay | Admitting: *Deleted

## 2011-10-25 ENCOUNTER — Encounter: Payer: Medicare HMO | Attending: Physical Medicine & Rehabilitation | Admitting: *Deleted

## 2011-10-25 VITALS — BP 143/83 | HR 87 | Resp 18 | Ht 64.0 in | Wt 143.0 lb

## 2011-10-25 DIAGNOSIS — G894 Chronic pain syndrome: Secondary | ICD-10-CM

## 2011-10-25 DIAGNOSIS — M545 Low back pain, unspecified: Secondary | ICD-10-CM | POA: Insufficient documentation

## 2011-10-25 DIAGNOSIS — M25569 Pain in unspecified knee: Secondary | ICD-10-CM | POA: Insufficient documentation

## 2011-10-25 DIAGNOSIS — M25519 Pain in unspecified shoulder: Secondary | ICD-10-CM | POA: Insufficient documentation

## 2011-10-25 DIAGNOSIS — IMO0002 Reserved for concepts with insufficient information to code with codable children: Secondary | ICD-10-CM

## 2011-10-25 DIAGNOSIS — G8929 Other chronic pain: Secondary | ICD-10-CM | POA: Insufficient documentation

## 2011-10-25 DIAGNOSIS — M1710 Unilateral primary osteoarthritis, unspecified knee: Secondary | ICD-10-CM | POA: Insufficient documentation

## 2011-10-25 MED ORDER — FENTANYL 100 MCG/HR TD PT72: 1.0000 | MEDICATED_PATCH | TRANSDERMAL | Status: DC

## 2011-10-25 NOTE — Progress Notes (Signed)
Reports frequent falls, due to falling asleep, even when on her feet. States she can sleep for "hours" in standing position. Reports symptoms of urinary frequency and urgency and requests U/A. Not able to get order for same as Dr Naaman Plummer not in office now.  Reports pain in both arms, wrists. Hopes to get a "shot" at next MD visit.

## 2011-10-25 NOTE — Patient Instructions (Signed)
Try to get appointment with your family doctor soon to evaluate your sleepiness, and urinary symptoms.

## 2011-10-30 ENCOUNTER — Ambulatory Visit: Payer: Medicare HMO | Admitting: Physical Medicine & Rehabilitation

## 2011-11-12 ENCOUNTER — Other Ambulatory Visit: Payer: Self-pay | Admitting: *Deleted

## 2011-11-12 MED ORDER — CLONAZEPAM 1 MG PO TABS: 2.0000 mg | ORAL_TABLET | Freq: Every evening | ORAL | Status: DC | PRN

## 2011-11-18 ENCOUNTER — Encounter: Payer: Medicare HMO | Attending: Physical Medicine & Rehabilitation | Admitting: Physical Medicine & Rehabilitation

## 2011-11-18 ENCOUNTER — Encounter: Payer: Self-pay | Admitting: Physical Medicine & Rehabilitation

## 2011-11-18 VITALS — BP 118/75 | HR 92 | Resp 16 | Ht 64.0 in | Wt 148.0 lb

## 2011-11-18 DIAGNOSIS — M67919 Unspecified disorder of synovium and tendon, unspecified shoulder: Secondary | ICD-10-CM | POA: Insufficient documentation

## 2011-11-18 DIAGNOSIS — M719 Bursopathy, unspecified: Secondary | ICD-10-CM | POA: Insufficient documentation

## 2011-11-18 DIAGNOSIS — M1710 Unilateral primary osteoarthritis, unspecified knee: Secondary | ICD-10-CM | POA: Insufficient documentation

## 2011-11-18 DIAGNOSIS — G2581 Restless legs syndrome: Secondary | ICD-10-CM | POA: Insufficient documentation

## 2011-11-18 DIAGNOSIS — M75101 Unspecified rotator cuff tear or rupture of right shoulder, not specified as traumatic: Secondary | ICD-10-CM | POA: Insufficient documentation

## 2011-11-18 DIAGNOSIS — IMO0002 Reserved for concepts with insufficient information to code with codable children: Secondary | ICD-10-CM

## 2011-11-18 DIAGNOSIS — M961 Postlaminectomy syndrome, not elsewhere classified: Secondary | ICD-10-CM | POA: Insufficient documentation

## 2011-11-18 DIAGNOSIS — M170 Bilateral primary osteoarthritis of knee: Secondary | ICD-10-CM

## 2011-11-18 MED ORDER — CARISOPRODOL 350 MG PO TABS: 350.0000 mg | ORAL_TABLET | Freq: Three times a day (TID) | ORAL | Status: DC | PRN

## 2011-11-18 MED ORDER — FENTANYL 100 MCG/HR TD PT72: 1.0000 | MEDICATED_PATCH | TRANSDERMAL | Status: DC

## 2011-11-18 MED ORDER — PENTAZOCINE-NALOXONE 50-0.5 MG PO TABS: 1.0000 | ORAL_TABLET | Freq: Three times a day (TID) | ORAL | Status: DC | PRN

## 2011-11-18 NOTE — Progress Notes (Signed)
Subjective:    Patient ID: Jill Phillips, female    DOB: 1955-09-29, 56 y.o.   MRN: [id number]  HPI Jill Phillips is back regarding her chronic pain. Right shoulder and knee are particularly bad.  She thinks she tore cartilage in the left knee as well.  That may have happened about a week ago. She was getting out of the shower.  She has been on the computer a lot recently and the right shoulder has been improving recently. Sometimes the pain refers to her elbow.    She is wearing the right knee brace regularly but is limited in distance and intensity of exercise.  Low back  Is generally stable.  Meds are helping to control.  She does report a lot of recent problems with UTI's       Pain Inventory Average Pain 6 Pain Right Now 4 My pain is constant, sharp, dull, aching and for last 3 weeks -bad  In the last 24 hours, has pain interfered with the following? General activity 4 Relation with others 1 Enjoyment of life 1 What TIME of day is your pain at its worst? morning and daytime Sleep (in general) Good  Pain is worse with: walking, bending and some activites Pain improves with: rest and medication Relief from Meds: 7  Mobility walk without assistance how many minutes can you walk? 15 ability to climb steps?  yes do you drive?  yes Do you have any goals in this area?  yes  Function disabled: date disabled 22 Do you have any goals in this area?  no  Neuro/Psych No problems in this area  Prior Studies Any changes since last visit?  no  Physicians involved in your care Primary care Dr Dennard Schaumann Psychiatrist Dr Candis Schatz Orthopedist Dr [name]      Review of Systems  Constitutional: Positive for diaphoresis.       Night sweats  Gastrointestinal: Positive for constipation.  All other systems reviewed and are negative.       Objective:   Physical Exam  Constitutional: She is oriented to person, place, and time. She appears well-developed and well-nourished.    HENT:  Head: Normocephalic.  Eyes: EOM are normal. Pupils are equal, round, and reactive to light.  Neck: Normal range of motion.  Cardiovascular: Normal rate.   Pulmonary/Chest: Effort normal.  Abdominal: Soft.  Musculoskeletal:       Significant crepitus at right knee with mild swelling.  Positive joint line tenderness. No instability.   Right shoulder is tender at the subacromial tenderness but RTC and biceps maneuvers were negative.  No shoulder instability noted.  Generalized tenderness of c-spine with some limitation in ROM.  Neurological: She is alert and oriented to person, place, and time.  Skin: Skin is warm.  Psychiatric: She has a normal mood and affect. Her behavior is normal. Judgment and thought content normal.          Assessment & Plan:  ASSESSMENT:  1. Chronic lumbar spine pain/post-lami syndrome 2. Osteoarthritis of the knees bilaterally, right greater than left.  3. Rotator cuff syndrome/subacromial bursitis. 4. Restless legs syndrome.  5. O2 dependent sleep apnea.  6. Tobacco abuse.  PLAN:  1.After informed consent i injected the right knee via lateral approach with 40mg  kenalog and 3cc %lidocaine.  2. We will continue with Klonopin and Neurontin low-dose 300 mg at  bedtime for restless leg symptoms and sleep.  3. The patient needs to resume her Voltaren gel and place this on her  knees and feet at nighttime. She may also use it on shoulder  4. Shoulder exercises were provided today. Only agst gravity 5. Refilled, soma #90, Talwin NX 1 q.8 hours p.r.n. #60 and Duragesic 100 mcg 1  q.72h hours #10 each without a refill.

## 2011-11-18 NOTE — Patient Instructions (Signed)
Impingement Syndrome, Rotator Cuff, Bursitis with Rehab Impingement syndrome is a condition that involves inflammation of the tendons of the rotator cuff and the subacromial bursa, that causes pain in the shoulder. The rotator cuff consists of four tendons and muscles that control much of the shoulder and upper arm function. The subacromial bursa is a fluid filled sac that helps reduce friction between the rotator cuff and one of the bones of the shoulder (acromion). Impingement syndrome is usually an overuse injury that causes swelling of the bursa (bursitis), swelling of the tendon (tendonitis), and/or a tear of the tendon (strain). Strains are classified into three categories. Grade 1 strains cause pain, but the tendon is not lengthened. Grade 2 strains include a lengthened ligament, due to the ligament being stretched or partially ruptured. With grade 2 strains there is still function, although the function may be decreased. Grade 3 strains include a complete tear of the tendon or muscle, and function is usually impaired. SYMPTOMS   Pain around the shoulder, often at the outer portion of the upper arm.   Pain that gets worse with shoulder function, especially when reaching overhead or lifting.   Sometimes, aching when not using the arm.   Pain that wakes you up at night.   Sometimes, tenderness, swelling, warmth, or redness over the affected area.   Loss of strength.   Limited motion of the shoulder, especially reaching behind the back (to the back pocket or to unhook bra) or across your body.   Crackling sound (crepitation) when moving the arm.   Biceps tendon pain and inflammation (in the front of the shoulder). Worse when bending the elbow or lifting.  CAUSES  Impingement syndrome is often an overuse injury, in which chronic (repetitive) motions cause the tendons or bursa to become inflamed. A strain occurs when a force is paced on the tendon or muscle that is greater than it can  withstand. Common mechanisms of injury include: Stress from sudden increase in duration, frequency, or intensity of training.  Direct hit (trauma) to the shoulder.   Aging, erosion of the tendon with normal use.   Bony bump on shoulder (acromial spur).  RISK INCREASES WITH:  Contact sports (football, wrestling, boxing).   Throwing sports (baseball, tennis, volleyball).   Weightlifting and bodybuilding.   Heavy labor.   Previous injury to the rotator cuff, including impingement.   Poor shoulder strength and flexibility.   Failure to warm up properly before activity.   Inadequate protective equipment.   Old age.   Bony bump on shoulder (acromial spur).  PREVENTION   Warm up and stretch properly before activity.   Allow for adequate recovery between workouts.   Maintain physical fitness:   Strength, flexibility, and endurance.   Cardiovascular fitness.   Learn and use proper exercise technique.  PROGNOSIS  If treated properly, impingement syndrome usually goes away within 6 weeks. Sometimes surgery is required.  RELATED COMPLICATIONS   Longer healing time if not properly treated, or if not given enough time to heal.   Recurring symptoms, that result in a chronic condition.   Shoulder stiffness, frozen shoulder, or loss of motion.   Rotator cuff tendon tear.   Recurring symptoms, especially if activity is resumed too soon, with overuse, with a direct blow, or when using poor technique.  TREATMENT  Treatment first involves the use of ice and medicine, to reduce pain and inflammation. The use of strengthening and stretching exercises may help reduce pain with activity. These exercises may  be performed at home or with a therapist. If non-surgical treatment is unsuccessful after more than 6 months, surgery may be advised. After surgery and rehabilitation, activity is usually possible in 3 months.  MEDICATION  If pain medicine is needed, nonsteroidal  anti-inflammatory medicines (aspirin and ibuprofen), or other minor pain relievers (acetaminophen), are often advised.   Do not take pain medicine for 7 days before surgery.   Prescription pain relievers may be given, if your caregiver thinks they are needed. Use only as directed and only as much as you need.   Corticosteroid injections may be given by your caregiver. These injections should be reserved for the most serious cases, because they may only be given a certain number of times.  HEAT AND COLD  Cold treatment (icing) should be applied for 10 to 15 minutes every 2 to 3 hours for inflammation and pain, and immediately after activity that aggravates your symptoms. Use ice packs or an ice massage.   Heat treatment may be used before performing stretching and strengthening activities prescribed by your caregiver, physical therapist, or athletic trainer. Use a heat pack or a warm water soak.  SEEK MEDICAL CARE IF:   Symptoms get worse or do not improve in 4 to 6 weeks, despite treatment.   New, unexplained symptoms develop. (Drugs used in treatment may produce side effects.)  EXERCISES  RANGE OF MOTION (ROM) AND STRETCHING EXERCISES - Impingement Syndrome (Rotator Cuff  Tendinitis, Bursitis) These exercises may help you when beginning to rehabilitate your injury. Your symptoms may go away with or without further involvement from your physician, physical therapist or athletic trainer. While completing these exercises, remember:   Restoring tissue flexibility helps normal motion to return to the joints. This allows healthier, less painful movement and activity.   An effective stretch should be held for at least 30 seconds.   A stretch should never be painful. You should only feel a gentle lengthening or release in the stretched tissue.  STRETCH - Flexion, Standing  Stand with good posture. With an underhand grip on your right / left hand, and an overhand grip on the opposite hand, grasp  a broomstick or cane so that your hands are a little more than shoulder width apart.   Keeping your right / left elbow straight and shoulder muscles relaxed, push the stick with your opposite hand, to raise your right / left arm in front of your body and then overhead. Raise your arm until you feel a stretch in your right / left shoulder, but before you have increased shoulder pain.   Try to avoid shrugging your right / left shoulder as your arm rises, by keeping your shoulder blade tucked down and toward your mid-back spine. Hold for __________ seconds.   Slowly return to the starting position.  Repeat __________ times. Complete this exercise __________ times per day. STRETCH - Abduction, Supine  Lie on your back. With an underhand grip on your right / left hand and an overhand grip on the opposite hand, grasp a broomstick or cane so that your hands are a little more than shoulder width apart.   Keeping your right / left elbow straight and your shoulder muscles relaxed, push the stick with your opposite hand, to raise your right / left arm out to the side of your body and then overhead. Raise your arm until you feel a stretch in your right / left shoulder, but before you have increased shoulder pain.   Try to avoid shrugging  your right / left shoulder as your arm rises, by keeping your shoulder blade tucked down and toward your mid-back spine. Hold for __________ seconds.   Slowly return to the starting position.  Repeat __________ times. Complete this exercise __________ times per day. ROM - Flexion, Active-Assisted  Lie on your back. You may bend your knees for comfort.   Grasp a broomstick or cane so your hands are about shoulder width apart. Your right / left hand should grip the end of the stick, so that your hand is positioned "thumbs-up," as if you were about to shake hands.   Using your healthy arm to lead, raise your right / left arm overhead, until you feel a gentle stretch in your  shoulder. Hold for __________ seconds.   Use the stick to assist in returning your right / left arm to its starting position.  Repeat __________ times. Complete this exercise __________ times per day.  ROM - Internal Rotation, Supine   Lie on your back on a firm surface. Place your right / left elbow about 60 degrees away from your side. Elevate your elbow with a folded towel, so that the elbow and shoulder are the same height.   Using a broomstick or cane and your strong arm, pull your right / left hand toward your body until you feel a gentle stretch, but no increase in your shoulder pain. Keep your shoulder and elbow in place throughout the exercise.   Hold for __________ seconds. Slowly return to the starting position.  Repeat __________ times. Complete this exercise __________ times per day. STRETCH - Internal Rotation  Place your right / left hand behind your back, palm up.   Throw a towel or belt over your opposite shoulder. Grasp the towel with your right / left hand.   While keeping an upright posture, gently pull up on the towel, until you feel a stretch in the front of your right / left shoulder.   Avoid shrugging your right / left shoulder as your arm rises, by keeping your shoulder blade tucked down and toward your mid-back spine.   Hold for __________ seconds. Release the stretch, by lowering your healthy hand.  Repeat __________ times. Complete this exercise __________ times per day. ROM - Internal Rotation   Using an underhand grip, grasp a stick behind your back with both hands.   While standing upright with good posture, slide the stick up your back until you feel a mild stretch in the front of your shoulder.   Hold for __________ seconds. Slowly return to your starting position.  Repeat __________ times. Complete this exercise __________ times per day.  STRETCH - Posterior Shoulder Capsule   Stand or sit with good posture. Grasp your right / left elbow and draw it  across your chest, keeping it at the same height as your shoulder.   Pull your elbow, so your upper arm comes in closer to your chest. Pull until you feel a gentle stretch in the back of your shoulder.   Hold for __________ seconds.  Repeat __________ times. Complete this exercise __________ times per day. STRENGTHENING EXERCISES - Impingement Syndrome (Rotator Cuff Tendinitis, Bursitis) These exercises may help you when beginning to rehabilitate your injury. They may resolve your symptoms with or without further involvement from your physician, physical therapist or athletic trainer. While completing these exercises, remember:  Muscles can gain both the endurance and the strength needed for everyday activities through controlled exercises.   Complete these exercises as  instructed by your physician, physical therapist or athletic trainer. Increase the resistance and repetitions only as guided.   You may experience muscle soreness or fatigue, but the pain or discomfort you are trying to eliminate should never worsen during these exercises. If this pain does get worse, stop and make sure you are following the directions exactly. If the pain is still present after adjustments, discontinue the exercise until you can discuss the trouble with your clinician.   During your recovery, avoid activity or exercises which involve actions that place your injured hand or elbow above your head or behind your back or head. These positions stress the tissues which you are trying to heal.  STRENGTH - Scapular Depression and Adduction   With good posture, sit on a firm chair. Support your arms in front of you, with pillows, arm rests, or on a table top. Have your elbows in line with the sides of your body.   Gently draw your shoulder blades down and toward your mid-back spine. Gradually increase the tension, without tensing the muscles along the top of your shoulders and the back of your neck.   Hold for  __________ seconds. Slowly release the tension and relax your muscles completely before starting the next repetition.   After you have practiced this exercise, remove the arm support and complete the exercise in standing as well as sitting position.  Repeat __________ times. Complete this exercise __________ times per day.  STRENGTH - Shoulder Abductors, Isometric  With good posture, stand or sit about 4-6 inches from a wall, with your right / left side facing the wall.   Bend your right / left elbow. Gently press your right / left elbow into the wall. Increase the pressure gradually, until you are pressing as hard as you can, without shrugging your shoulder or increasing any shoulder discomfort.   Hold for __________ seconds.   Release the tension slowly. Relax your shoulder muscles completely before you begin the next repetition.  Repeat __________ times. Complete this exercise __________ times per day.  STRENGTH - External Rotators, Isometric  Keep your right / left elbow at your side and bend it 90 degrees.   Step into a door frame so that the outside of your right / left wrist can press against the door frame without your upper arm leaving your side.   Gently press your right / left wrist into the door frame, as if you were trying to swing the back of your hand away from your stomach. Gradually increase the tension, until you are pressing as hard as you can, without shrugging your shoulder or increasing any shoulder discomfort.   Hold for __________ seconds.   Release the tension slowly. Relax your shoulder muscles completely before you begin the next repetition.  Repeat __________ times. Complete this exercise __________ times per day.  STRENGTH - Supraspinatus   Stand or sit with good posture. Grasp a __________ weight, or an exercise band or tubing, so that your hand is "thumbs-up," like you are shaking hands.   Slowly lift your right / left arm in a "V" away from your thigh,  diagonally into the space between your side and straight ahead. Lift your hand to shoulder height or as far as you can, without increasing any shoulder pain. At first, many people do not lift their hands above shoulder height.   Avoid shrugging your right / left shoulder as your arm rises, by keeping your shoulder blade tucked down and toward your mid-back  spine.   Hold for __________ seconds. Control the descent of your hand, as you slowly return to your starting position.  Repeat __________ times. Complete this exercise __________ times per day.  STRENGTH - External Rotators  Secure a rubber exercise band or tubing to a fixed object (table, pole) so that it is at the same height as your right / left elbow when you are standing or sitting on a firm surface.   Stand or sit so that the secured exercise band is at your uninjured side.   Bend your right / left elbow 90 degrees. Place a folded towel or small pillow under your right / left arm, so that your elbow is a few inches away from your side.   Keeping the tension on the exercise band, pull it away from your body, as if pivoting on your elbow. Be sure to keep your body steady, so that the movement is coming only from your rotating shoulder.   Hold for __________ seconds. Release the tension in a controlled manner, as you return to the starting position.  Repeat __________ times. Complete this exercise __________ times per day.  STRENGTH - Internal Rotators   Secure a rubber exercise band or tubing to a fixed object (table, pole) so that it is at the same height as your right / left elbow when you are standing or sitting on a firm surface.   Stand or sit so that the secured exercise band is at your right / left side.   Bend your elbow 90 degrees. Place a folded towel or small pillow under your right / left arm so that your elbow is a few inches away from your side.   Keeping the tension on the exercise band, pull it across your body,  toward your stomach. Be sure to keep your body steady, so that the movement is coming only from your rotating shoulder.   Hold for __________ seconds. Release the tension in a controlled manner, as you return to the starting position.  Repeat __________ times. Complete this exercise __________ times per day.  STRENGTH - Scapular Protractors, Standing   Stand arms length away from a wall. Place your hands on the wall, keeping your elbows straight.   Begin by dropping your shoulder blades down and toward your mid-back spine.   To strengthen your protractors, keep your shoulder blades down, but slide them forward on your rib cage. It will feel as if you are lifting the back of your rib cage away from the wall. This is a subtle motion and can be challenging to complete. Ask your caregiver for further instruction, if you are not sure you are doing the exercise correctly.   Hold for __________ seconds. Slowly return to the starting position, resting the muscles completely before starting the next repetition.  Repeat __________ times. Complete this exercise __________ times per day. STRENGTH - Scapular Protractors, Supine  Lie on your back on a firm surface. Extend your right / left arm straight into the air while holding a __________ weight in your hand.   Keeping your head and back in place, lift your shoulder off the floor.   Hold for __________ seconds. Slowly return to the starting position, and allow your muscles to relax completely before starting the next repetition.  Repeat __________ times. Complete this exercise __________ times per day. STRENGTH - Scapular Protractors, Quadruped  Get onto your hands and knees, with your shoulders directly over your hands (or as close as you can  be, comfortably).   Keeping your elbows locked, lift the back of your rib cage up into your shoulder blades, so your mid-back rounds out. Keep your neck muscles relaxed.   Hold this position for __________  seconds. Slowly return to the starting position and allow your muscles to relax completely before starting the next repetition.  Repeat __________ times. Complete this exercise __________ times per day.  STRENGTH - Scapular Retractors  Secure a rubber exercise band or tubing to a fixed object (table, pole), so that it is at the height of your shoulders when you are either standing, or sitting on a firm armless chair.   With a palm down grip, grasp an end of the band in each hand. Straighten your elbows and lift your hands straight in front of you, at shoulder height. Step back, away from the secured end of the band, until it becomes tense.   Squeezing your shoulder blades together, draw your elbows back toward your sides, as you bend them. Keep your upper arms lifted away from your body throughout the exercise.   Hold for __________ seconds. Slowly ease the tension on the band, as you reverse the directions and return to the starting position.  Repeat __________ times. Complete this exercise __________ times per day. STRENGTH - Shoulder Extensors   Secure a rubber exercise band or tubing to a fixed object (table, pole) so that it is at the height of your shoulders when you are either standing, or sitting on a firm armless chair.   With a thumbs-up grip, grasp an end of the band in each hand. Straighten your elbows and lift your hands straight in front of you, at shoulder height. Step back, away from the secured end of the band, until it becomes tense.   Squeezing your shoulder blades together, pull your hands down to the sides of your thighs. Do not allow your hands to go behind you.   Hold for __________ seconds. Slowly ease the tension on the band, as you reverse the directions and return to the starting position.  Repeat __________ times. Complete this exercise __________ times per day.  STRENGTH - Scapular Retractors and External Rotators   Secure a rubber exercise band or tubing to a  fixed object (table, pole) so that it is at the height as your shoulders, when you are either standing, or sitting on a firm armless chair.   With a palm down grip, grasp an end of the band in each hand. Bend your elbows 90 degrees and lift your elbows to shoulder height, at your sides. Step back, away from the secured end of the band, until it becomes tense.   Squeezing your shoulder blades together, rotate your shoulders so that your upper arms and elbows remain stationary, but your fists travel upward to head height.   Hold for __________ seconds. Slowly ease the tension on the band, as you reverse the directions and return to the starting position.  Repeat __________ times. Complete this exercise __________ times per day.  STRENGTH - Scapular Retractors and External Rotators, Rowing   Secure a rubber exercise band or tubing to a fixed object (table, pole) so that it is at the height of your shoulders, when you are either standing, or sitting on a firm armless chair.   With a palm down grip, grasp an end of the band in each hand. Straighten your elbows and lift your hands straight in front of you, at shoulder height. Step back, away from the  secured end of the band, until it becomes tense.   Step 1: Squeeze your shoulder blades together. Bending your elbows, draw your hands to your chest, as if you are rowing a boat. At the end of this motion, your hands and elbow should be at shoulder height and your elbows should be out to your sides.   Step 2: Rotate your shoulders, to raise your hands above your head. Your forearms should be vertical and your upper arms should be horizontal.   Hold for __________ seconds. Slowly ease the tension on the band, as you reverse the directions and return to the starting position.  Repeat __________ times. Complete this exercise __________ times per day.  STRENGTH - Scapular Depressors  Find a sturdy chair without wheels, such as a dining room chair.    Keeping your feet on the floor, and your hands on the chair arms, lift your bottom up from the seat, and lock your elbows.   Keeping your elbows straight, allow gravity to pull your body weight down. Your shoulders will rise toward your ears.   Raise your body against gravity by drawing your shoulder blades down your back, shortening the distance between your shoulders and ears. Although your feet should always maintain contact with the floor, your feet should progressively support less body weight, as you get stronger.   Hold for __________ seconds. In a controlled and slow manner, lower your body weight to begin the next repetition.  Repeat __________ times. Complete this exercise __________ times per day.  Document Released: 08/19/2005 Document Revised: 08/08/2011 Document Reviewed: 12/01/2008 [facility] Patient Information 2012 [place].

## 2011-11-19 ENCOUNTER — Telehealth: Payer: Self-pay | Admitting: Physical Medicine & Rehabilitation

## 2011-11-19 ENCOUNTER — Encounter: Payer: Self-pay | Admitting: Physical Medicine & Rehabilitation

## 2011-11-19 DIAGNOSIS — M961 Postlaminectomy syndrome, not elsewhere classified: Secondary | ICD-10-CM

## 2011-11-19 DIAGNOSIS — M170 Bilateral primary osteoarthritis of knee: Secondary | ICD-10-CM

## 2011-11-19 DIAGNOSIS — M75101 Unspecified rotator cuff tear or rupture of right shoulder, not specified as traumatic: Secondary | ICD-10-CM

## 2011-11-19 NOTE — Telephone Encounter (Signed)
In the system it states that the rx was not printed. Did you mean to refill?

## 2011-11-19 NOTE — Telephone Encounter (Signed)
Patient did not get Rx for her patches when here last week.

## 2011-11-20 MED ORDER — FENTANYL 100 MCG/HR TD PT72: 1.0000 | MEDICATED_PATCH | TRANSDERMAL | Status: DC

## 2011-11-20 NOTE — Telephone Encounter (Signed)
i seem to recall giving her a fentanyl script, but apparently it wasn't done.  i have printed out one today

## 2011-11-20 NOTE — Telephone Encounter (Signed)
Addended by: Horatio Pel on: 11/20/2011 03:03 PM   Modules accepted: Orders

## 2011-11-21 ENCOUNTER — Other Ambulatory Visit: Payer: Self-pay | Admitting: *Deleted

## 2011-11-21 DIAGNOSIS — M170 Bilateral primary osteoarthritis of knee: Secondary | ICD-10-CM

## 2011-11-21 DIAGNOSIS — M961 Postlaminectomy syndrome, not elsewhere classified: Secondary | ICD-10-CM

## 2011-11-21 DIAGNOSIS — M75101 Unspecified rotator cuff tear or rupture of right shoulder, not specified as traumatic: Secondary | ICD-10-CM

## 2011-11-21 MED ORDER — FENTANYL 100 MCG/HR TD PT72: 1.0000 | MEDICATED_PATCH | TRANSDERMAL | Status: DC

## 2011-11-21 MED ORDER — PENTAZOCINE-NALOXONE 50-0.5 MG PO TABS: 1.0000 | ORAL_TABLET | Freq: Three times a day (TID) | ORAL | Status: DC | PRN

## 2011-11-21 NOTE — Telephone Encounter (Signed)
Jill Phillips was here for office visit on 11/18/2011 and Dr Naaman Plummer did not print out the Fentanyl rx. I was uncertain if she got the Talwin rx as well, so I printed both as refills, but I have since spoken with her and it was the Fentanyl only that she needed. Talwin shows refill today but the rx was not given and was shredded, and only Fentanyl given.

## 2011-11-25 ENCOUNTER — Encounter: Payer: Self-pay | Admitting: Physical Medicine & Rehabilitation

## 2011-11-25 ENCOUNTER — Encounter: Payer: Self-pay | Admitting: *Deleted

## 2011-12-10 ENCOUNTER — Telehealth: Payer: Self-pay | Admitting: *Deleted

## 2011-12-10 NOTE — Telephone Encounter (Signed)
We received letter from [facility] re: coverage for her Talwin. Dr Naaman Plummer can switch to [facility] patch, which is on formulary, or she can appeal their decision.

## 2011-12-12 ENCOUNTER — Encounter: Payer: Self-pay | Admitting: *Deleted

## 2011-12-12 NOTE — Progress Notes (Signed)
Prior Authorization initiated for Talwin NX. With [facility].

## 2011-12-17 ENCOUNTER — Encounter: Payer: Self-pay | Admitting: *Deleted

## 2011-12-17 ENCOUNTER — Encounter: Payer: Medicare HMO | Attending: Physical Medicine & Rehabilitation | Admitting: *Deleted

## 2011-12-17 VITALS — BP 155/93 | HR 82 | Resp 14 | Ht 64.0 in | Wt 148.0 lb

## 2011-12-17 DIAGNOSIS — IMO0002 Reserved for concepts with insufficient information to code with codable children: Secondary | ICD-10-CM

## 2011-12-17 DIAGNOSIS — M67919 Unspecified disorder of synovium and tendon, unspecified shoulder: Secondary | ICD-10-CM

## 2011-12-17 DIAGNOSIS — M545 Low back pain, unspecified: Secondary | ICD-10-CM | POA: Insufficient documentation

## 2011-12-17 DIAGNOSIS — M75101 Unspecified rotator cuff tear or rupture of right shoulder, not specified as traumatic: Secondary | ICD-10-CM

## 2011-12-17 DIAGNOSIS — M170 Bilateral primary osteoarthritis of knee: Secondary | ICD-10-CM

## 2011-12-17 DIAGNOSIS — M719 Bursopathy, unspecified: Secondary | ICD-10-CM

## 2011-12-17 DIAGNOSIS — M1710 Unilateral primary osteoarthritis, unspecified knee: Secondary | ICD-10-CM | POA: Insufficient documentation

## 2011-12-17 DIAGNOSIS — G2581 Restless legs syndrome: Secondary | ICD-10-CM

## 2011-12-17 DIAGNOSIS — G8929 Other chronic pain: Secondary | ICD-10-CM | POA: Insufficient documentation

## 2011-12-17 DIAGNOSIS — M961 Postlaminectomy syndrome, not elsewhere classified: Secondary | ICD-10-CM

## 2011-12-17 MED ORDER — FENTANYL 100 MCG/HR TD PT72: 1.0000 | MEDICATED_PATCH | TRANSDERMAL | Status: DC

## 2011-12-17 MED ORDER — PENTAZOCINE-NALOXONE 50-0.5 MG PO TABS: 1.0000 | ORAL_TABLET | Freq: Three times a day (TID) | ORAL | Status: DC | PRN

## 2011-12-17 NOTE — Progress Notes (Signed)
No changes noted. Advised patient to keep medications in a safe place.

## 2011-12-31 ENCOUNTER — Telehealth: Payer: Self-pay | Admitting: Physical Medicine & Rehabilitation

## 2011-12-31 NOTE — Telephone Encounter (Signed)
Restless leg so bad.  Klonopin does not help during day.  Getting so bad she can't stand it.  Please call.

## 2011-12-31 NOTE — Telephone Encounter (Signed)
Lm for pt to call office regarding her pain.

## 2012-01-01 NOTE — Telephone Encounter (Signed)
Lm for pt to call office for clarification.

## 2012-01-02 ENCOUNTER — Other Ambulatory Visit: Payer: Self-pay

## 2012-01-02 MED ORDER — PRAMIPEXOLE DIHYDROCHLORIDE 1 MG PO TABS: 1.0000 mg | ORAL_TABLET | Freq: Every day | ORAL | Status: DC

## 2012-01-20 ENCOUNTER — Encounter: Payer: Self-pay | Admitting: Physical Medicine and Rehabilitation

## 2012-01-20 ENCOUNTER — Encounter: Payer: Medicare HMO | Attending: Physical Medicine & Rehabilitation | Admitting: Physical Medicine and Rehabilitation

## 2012-01-20 VITALS — BP 127/62 | HR 89 | Resp 16 | Ht 64.0 in | Wt 148.0 lb

## 2012-01-20 DIAGNOSIS — M1710 Unilateral primary osteoarthritis, unspecified knee: Secondary | ICD-10-CM | POA: Insufficient documentation

## 2012-01-20 DIAGNOSIS — M75101 Unspecified rotator cuff tear or rupture of right shoulder, not specified as traumatic: Secondary | ICD-10-CM

## 2012-01-20 DIAGNOSIS — IMO0002 Reserved for concepts with insufficient information to code with codable children: Secondary | ICD-10-CM

## 2012-01-20 DIAGNOSIS — M67919 Unspecified disorder of synovium and tendon, unspecified shoulder: Secondary | ICD-10-CM

## 2012-01-20 DIAGNOSIS — G2581 Restless legs syndrome: Secondary | ICD-10-CM

## 2012-01-20 DIAGNOSIS — M25569 Pain in unspecified knee: Secondary | ICD-10-CM

## 2012-01-20 DIAGNOSIS — M25561 Pain in right knee: Secondary | ICD-10-CM

## 2012-01-20 DIAGNOSIS — M170 Bilateral primary osteoarthritis of knee: Secondary | ICD-10-CM

## 2012-01-20 DIAGNOSIS — M545 Low back pain, unspecified: Secondary | ICD-10-CM

## 2012-01-20 DIAGNOSIS — M719 Bursopathy, unspecified: Secondary | ICD-10-CM | POA: Insufficient documentation

## 2012-01-20 DIAGNOSIS — M961 Postlaminectomy syndrome, not elsewhere classified: Secondary | ICD-10-CM | POA: Insufficient documentation

## 2012-01-20 MED ORDER — PENTAZOCINE-NALOXONE 50-0.5 MG PO TABS: 1.0000 | ORAL_TABLET | Freq: Three times a day (TID) | ORAL | Status: DC | PRN

## 2012-01-20 MED ORDER — FENTANYL 100 MCG/HR TD PT72: 1.0000 | MEDICATED_PATCH | TRANSDERMAL | Status: DC

## 2012-01-20 NOTE — Progress Notes (Signed)
  Subjective:    Patient ID: Jill Phillips, female    DOB: March 29, 1956, 56 y.o.   MRN: [id number]  HPI Pain Inventory Average Pain 4 Pain Right Now 4 My pain is sharp and aching  In the last 24 hours, has pain interfered with the following? General activity 5 Relation with others 5 Enjoyment of life 6 What TIME of day is your pain at its worst? morning Sleep (in general) Good  Pain is worse with: walking, bending, sitting and standing Pain improves with: rest and medication Relief from Meds: 9  Mobility walk without assistance how many minutes can you walk? 10-15 ability to climb steps?  yes do you drive?  no Do you have any goals in this area?  yes  Function disabled: date disabled   Neuro/Psych No problems in this area  Prior Studies Any changes since last visit?  no  Physicians involved in your care Any changes since last visit?  no   Review of Systems  Constitutional: Negative.   HENT: Negative.   Eyes: Negative.   Respiratory: Negative.   Cardiovascular: Negative.   Gastrointestinal: Negative.   Genitourinary: Negative.   Musculoskeletal: Negative.   Skin: Negative.   Hematological: Negative.   Psychiatric/Behavioral: Negative.    Patient states, that her low back pain is controlled. She complains about restless leg syndrome in her legs and arms. Complains about some right knee pain after walking for a prolonged time.    Objective:   Physical Exam  Constitutional: She is oriented to person, place, and time. She appears well-developed and well-nourished.  HENT:  Head: Normocephalic.  Eyes: EOM are normal. Pupils are equal, round, and reactive to light.  Neck: Normal range of motion.  Cardiovascular: Normal rate.  Pulmonary/Chest: Effort normal.  Abdominal: Soft.  Musculoskeletal:  Significant crepitus at right knee with mild swelling. Positive joint line tenderness. No instability.  Muscle strength in lower extremities 5/5. DTR on the right 2+, 0  on the left LE.    Neurological: She is alert and oriented to person, place, and time.  Skin: Skin is warm.  Psychiatric: She has a normal mood and affect. Her behavior is normal. Judgment and thought content normal.        Assessment & Plan:  Post multiple low back surgeries, LBP is controlled with medication. Right knee pain. Continue medications for pain control. Advised patient to continue walking program.  Advised patient to talk to her PCP about getting a DEXA scan, because of her osteo arthritis, and Hx of  hip fracture. Follow up in 1 month

## 2012-01-20 NOTE — Patient Instructions (Signed)
Continue medication, continue walking program.

## 2012-02-07 ENCOUNTER — Encounter: Payer: Self-pay | Admitting: Physical Medicine & Rehabilitation

## 2012-02-07 ENCOUNTER — Telehealth: Payer: Self-pay | Admitting: *Deleted

## 2012-02-07 NOTE — Telephone Encounter (Signed)
FYI - pt PCP has decreased Fentanyl to 8mcg due to pt passing out for 3 days. He gave her a new rx for new dosage.

## 2012-02-11 ENCOUNTER — Encounter: Payer: Self-pay | Admitting: Physical Medicine and Rehabilitation

## 2012-02-11 ENCOUNTER — Encounter: Payer: Medicare HMO | Attending: Physical Medicine & Rehabilitation | Admitting: Physical Medicine and Rehabilitation

## 2012-02-11 VITALS — BP 150/87 | HR 91 | Resp 16 | Ht 64.0 in | Wt 143.4 lb

## 2012-02-11 DIAGNOSIS — G8929 Other chronic pain: Secondary | ICD-10-CM | POA: Insufficient documentation

## 2012-02-11 DIAGNOSIS — M1710 Unilateral primary osteoarthritis, unspecified knee: Secondary | ICD-10-CM | POA: Insufficient documentation

## 2012-02-11 DIAGNOSIS — M961 Postlaminectomy syndrome, not elsewhere classified: Secondary | ICD-10-CM | POA: Insufficient documentation

## 2012-02-11 DIAGNOSIS — M25551 Pain in right hip: Secondary | ICD-10-CM

## 2012-02-11 DIAGNOSIS — G2581 Restless legs syndrome: Secondary | ICD-10-CM | POA: Insufficient documentation

## 2012-02-11 DIAGNOSIS — M25559 Pain in unspecified hip: Secondary | ICD-10-CM

## 2012-02-11 MED ORDER — FENTANYL 25 MCG/HR TD PT72: 1.0000 | MEDICATED_PATCH | TRANSDERMAL | Status: DC

## 2012-02-11 NOTE — Progress Notes (Deleted)
  Subjective:    Patient ID: Jill Phillips, female    DOB: 08/13/1956, 56 y.o.   MRN: [id number]  HPI  Pain Inventory Average Pain {NUMBERS; 0-10:5044} Pain Right Now {NUMBERS; 0-10:5044} My pain is {PAIN DESCRIPTION:21022940}  In the last 24 hours, has pain interfered with the following? General activity {NUMBERS; 0-10:5044} Relation with others {NUMBERS; 0-10:5044} Enjoyment of life {NUMBERS; 0-10:5044} What TIME of day is your pain at its worst? {TIME OF PJ:[id number] Sleep (in general) {BHH GOOD/FAIR/POOR:22877}  Pain is worse with: {ACTIVITIES:21022942} Pain improves with: {PAIN IMPROVES BW:[id number] Relief from Meds: {NUMBERS; 0-10:5044}  Mobility walk without assistance how many minutes can you walk? 10-15 ability to climb steps?  yes do you drive?  no  Function {FUNCTION:21022946}  Neuro/Psych {NEURO/PSYCH:21022948}  Prior Studies Any changes since last visit?  yes medical MD Pickard has chsnged some of her pain medication due to issues falling at night  Physicians involved in your care Any changes since last visit?  no   Family History  Problem Relation Age of Onset  . Kidney disease Mother   . Heart disease Father   . Anuerysm Brother 29    brain  . Heart disease Brother    History   Social History  . Marital Status: Married    Spouse Name: N/A    Number of Children: N/A  . Years of Education: N/A   Social History Main Topics  . Smoking status: Current Everyday Smoker -- 2.0 packs/day for 45 years    Types: Cigarettes  . Smokeless tobacco: Never Used  . Alcohol Use: No  . Drug Use: No  . Sexually Active: None   Other Topics Concern  . None   Social History Narrative  . None   Past Surgical History  Procedure Date  . Appendectomy   . Abdominal hysterectomy   . Tubal ligation   . Spine surgery   . Right hip replacement   . Knee surgeries r knee   . Hammer toe surgery   . Sleep apnea surgery    Past Medical History  Diagnosis  Date  . Postlaminectomy syndrome, thoracic region   . Osteoarthrosis, unspecified whether generalized or localized, lower leg   . Dysthymic disorder   . Calcifying tendinitis of shoulder   . Pain in joint, upper arm   . Chronic pain syndrome   . Lumbago   . Primary localized osteoarthrosis, lower leg   . Hypertension   . Hyperlipidemia   . GERD (gastroesophageal reflux disease)   . Thyroid disease   . Restless leg syndrome    Ht 5\' 4"  (1.626 m)  Wt 143 lb 6.4 oz (65.046 kg)  BMI 24.61 kg/m2    Review of Systems     Objective:   Physical Exam        Assessment & Plan:

## 2012-02-11 NOTE — Patient Instructions (Signed)
Apply the 41mcg of the fentanyl patches with the 25 mcg for 5 times, then decrease to the 45mcg patches only. Start a walking program .

## 2012-02-11 NOTE — Progress Notes (Signed)
Subjective:    Patient ID: Jill Phillips, female    DOB: 06-05-1956, 56 y.o.   MRN: [id number]  HPI The patient complains about chronic bilateral hip pain .  The patient also complains about right knee pain.  The problem has been stable.  The patient reports that one week ago, she fell asleep on Monday night, and woke up Thursday morning. She reports that during this time she did not wet herself. She saw her primary care physician after this incident, who mentioned to her that he thinks she is on 2 much pain medication. Therefore he decreased her fentanyl patch from 100 mcg  to 50 mcg , she states that she has not changed to the 50 mcg patches yet. She states that she is afraid to decrease her pain medication by 50%.  Pain Inventory Average Pain 3 Pain Right Now 2 My pain is dull and aching  In the last 24 hours, has pain interfered with the following? General activity 0 Relation with others 0 Enjoyment of life 0 What TIME of day is your pain at its worst? evening Sleep (in general) Fair  Pain is worse with: walking Pain improves with: rest and medication Relief from Meds: 8  Mobility walk without assistance  Function disabled: date disabled 60  Neuro/Psych No problems in this area  Prior Studies Any changes since last visit?  yes medical doctor Pickard changed some pain medication because of falls at night  Physicians involved in your care Any changes since last visit?  no   Family History  Problem Relation Age of Onset  . Kidney disease Mother   . Heart disease Father   . Anuerysm Brother 29    brain  . Heart disease Brother    History   Social History  . Marital Status: Married    Spouse Name: N/A    Number of Children: N/A  . Years of Education: N/A   Social History Main Topics  . Smoking status: Current Everyday Smoker -- 2.0 packs/day for 45 years    Types: Cigarettes  . Smokeless tobacco: Never Used  . Alcohol Use: No  . Drug Use: No  . Sexually  Active: None   Other Topics Concern  . None   Social History Narrative  . None   Past Surgical History  Procedure Date  . Appendectomy   . Abdominal hysterectomy   . Tubal ligation   . Spine surgery   . Right hip replacement   . Knee surgeries r knee   . Hammer toe surgery   . Sleep apnea surgery    Past Medical History  Diagnosis Date  . Postlaminectomy syndrome, thoracic region   . Osteoarthrosis, unspecified whether generalized or localized, lower leg   . Dysthymic disorder   . Calcifying tendinitis of shoulder   . Pain in joint, upper arm   . Chronic pain syndrome   . Lumbago   . Primary localized osteoarthrosis, lower leg   . Hypertension   . Hyperlipidemia   . GERD (gastroesophageal reflux disease)   . Thyroid disease   . Restless leg syndrome    BP 150/87  Pulse 91  Resp 16  Ht 5\' 4"  (1.626 m)  Wt 143 lb 6.4 oz (65.046 kg)  BMI 24.61 kg/m2  SpO2 96%    Review of Systems  Respiratory: Positive for apnea and shortness of breath.   Musculoskeletal: Positive for back pain.       Leg spasms  All other systems  reviewed and are negative.       Objective:   Physical Exam  Constitutional: She is oriented to person, place, and time. She appears well-developed and well-nourished.  HENT:  Head: Normocephalic.  Musculoskeletal: She exhibits tenderness.  Neurological: She is alert and oriented to person, place, and time.  Skin: Skin is warm and dry.  Psychiatric: She has a normal mood and affect.    Symmetric normal motor tone is noted throughout. Normal muscle bulk. Muscle testing reveals 5/5 muscle strength of the upper extremity, and 5/5 of the lower extremity. Full range of motion in upper and lower extremities, except decrease ROM in right knee, extension deficit. ROM of spine is  restricted. Fine motor movements are normal in both hands.  DTR in the upper and lower extremity are present and symmetric 2+, except left patella reflex 0. No clonus is  noted.  Patient arises from chair with mild difficulty. Narrow based gait with normal arm swing bilateral.      Assessment & Plan:  This is a  female with 1.Osteo arthritis in her knees bilateral 2.Post laminectomy syndrome 3.restless leg syndrome 4. Bilateral hip pain Plan : Decrease her fentanyl patches from 100 mcg to 32mcg for 2 weeks and then to 50 mcg. Advise patient not to take all of her medication at nighttime. Advised patient to start a walking program. If the patient still has some episodes of decreased consciousness,extended sleep, I might refer her to neurology at the next visit Follow up in 1 month.

## 2012-02-13 ENCOUNTER — Other Ambulatory Visit: Payer: Self-pay

## 2012-02-13 MED ORDER — CLONAZEPAM 1 MG PO TABS: 1.0000 mg | ORAL_TABLET | Freq: Two times a day (BID) | ORAL | Status: DC | PRN

## 2012-02-20 ENCOUNTER — Ambulatory Visit: Payer: Medicare HMO | Admitting: Physical Medicine and Rehabilitation

## 2012-02-20 ENCOUNTER — Other Ambulatory Visit: Payer: Self-pay | Admitting: *Deleted

## 2012-02-20 DIAGNOSIS — M961 Postlaminectomy syndrome, not elsewhere classified: Secondary | ICD-10-CM

## 2012-02-20 DIAGNOSIS — M170 Bilateral primary osteoarthritis of knee: Secondary | ICD-10-CM

## 2012-02-20 DIAGNOSIS — M75101 Unspecified rotator cuff tear or rupture of right shoulder, not specified as traumatic: Secondary | ICD-10-CM

## 2012-02-20 MED ORDER — PENTAZOCINE-NALOXONE 50-0.5 MG PO TABS: 1.0000 | ORAL_TABLET | Freq: Three times a day (TID) | ORAL | Status: DC | PRN

## 2012-03-02 ENCOUNTER — Telehealth: Payer: Self-pay | Admitting: *Deleted

## 2012-03-02 NOTE — Telephone Encounter (Signed)
Left VM for Artis to call us back about appointment availability on Wednesday with Dr Naaman Plummer.

## 2012-03-02 NOTE — Telephone Encounter (Signed)
Jill Phillips called and we rescheduled her appointment to Wednesday with Dr Naaman Plummer.

## 2012-03-02 NOTE — Telephone Encounter (Signed)
Given the recent hospitalization, any changes would need to be made in person here in the office, and i might need to consult with the physcians who treated her.

## 2012-03-02 NOTE — Telephone Encounter (Signed)
Duragesic is being decreased from 100 mcg to 50 mcg.  She has (1) 75 mcg patch left and then she goes to the 50 mcg. She has been having back pain like she has not had in a long time. She is having to take more of her breakthrough medication which she typically only has to take 1 daily.  Also, her restless leg is flared up. She felt better on the 100 mcg Duragesic.

## 2012-03-03 ENCOUNTER — Other Ambulatory Visit: Payer: Self-pay | Admitting: *Deleted

## 2012-03-03 DIAGNOSIS — G2581 Restless legs syndrome: Secondary | ICD-10-CM

## 2012-03-03 DIAGNOSIS — M170 Bilateral primary osteoarthritis of knee: Secondary | ICD-10-CM

## 2012-03-03 DIAGNOSIS — M75101 Unspecified rotator cuff tear or rupture of right shoulder, not specified as traumatic: Secondary | ICD-10-CM

## 2012-03-03 DIAGNOSIS — M961 Postlaminectomy syndrome, not elsewhere classified: Secondary | ICD-10-CM

## 2012-03-03 MED ORDER — CARISOPRODOL 350 MG PO TABS: 350.0000 mg | ORAL_TABLET | Freq: Three times a day (TID) | ORAL | Status: DC | PRN

## 2012-03-04 ENCOUNTER — Encounter: Payer: Medicare HMO | Attending: Physical Medicine & Rehabilitation | Admitting: Physical Medicine & Rehabilitation

## 2012-03-04 ENCOUNTER — Encounter: Payer: Self-pay | Admitting: Physical Medicine & Rehabilitation

## 2012-03-04 VITALS — BP 135/84 | HR 70 | Resp 16 | Ht 64.0 in | Wt 145.6 lb

## 2012-03-04 DIAGNOSIS — G2581 Restless legs syndrome: Secondary | ICD-10-CM

## 2012-03-04 DIAGNOSIS — M1710 Unilateral primary osteoarthritis, unspecified knee: Secondary | ICD-10-CM

## 2012-03-04 DIAGNOSIS — M67919 Unspecified disorder of synovium and tendon, unspecified shoulder: Secondary | ICD-10-CM

## 2012-03-04 DIAGNOSIS — M75101 Unspecified rotator cuff tear or rupture of right shoulder, not specified as traumatic: Secondary | ICD-10-CM

## 2012-03-04 DIAGNOSIS — M170 Bilateral primary osteoarthritis of knee: Secondary | ICD-10-CM

## 2012-03-04 DIAGNOSIS — M719 Bursopathy, unspecified: Secondary | ICD-10-CM | POA: Insufficient documentation

## 2012-03-04 DIAGNOSIS — M961 Postlaminectomy syndrome, not elsewhere classified: Secondary | ICD-10-CM | POA: Insufficient documentation

## 2012-03-04 DIAGNOSIS — IMO0002 Reserved for concepts with insufficient information to code with codable children: Secondary | ICD-10-CM

## 2012-03-04 MED ORDER — FENTANYL 75 MCG/HR TD PT72: 1.0000 | MEDICATED_PATCH | TRANSDERMAL | Status: DC

## 2012-03-04 NOTE — Patient Instructions (Signed)
PLEASE USE YOUR KNEE BRACE AS OFTEN AS POSSIBLE.   GLUCOSAMINE AND CHONDROITIN OMEGA 3 FATTY ACID

## 2012-03-04 NOTE — Progress Notes (Signed)
Subjective:    Patient ID: Jill Phillips, female    DOB: 02/27/1956, 56 y.o.   MRN: [id number]  HPI  Marvelene is back regarding her low back. She thinks that she fell out of bed about a month ago on Monday night/early morning and didn't wake up until Thursday. Her PCP felt that it was narc related and decreased her patch to 51mcg and ultimately wanted to decrease to 27mcg.  She tells me she can feel the difference between the 157mcg and 22mcg fentanyl patches but that she's willing to tolerate the 75. She is not sure about going down to the 33mcg patch.  She feels relief with her knee brace but isn't wearing as much as she should.   Pain Inventory Average Pain 5 Pain Right Now 6 My pain is constant, sharp, dull, stabbing and aching  In the last 24 hours, has pain interfered with the following? General activity 8 Relation with others 8 Enjoyment of life 8 What TIME of day is your pain at its worst? daytime and evening  Sleep (in general) Fair  Pain is worse with: walking, bending, standing and some activites Pain improves with: rest and medication Relief from Meds: 10  Mobility walk without assistance how many minutes can you walk? 15 ability to climb steps?  yes do you drive?  yes transfers alone  Function disabled: date disabled   Neuro/Psych trouble walking  Prior Studies Any changes since last visit?  no  Physicians involved in your care Any changes since last visit?  no   Family History  Problem Relation Age of Onset  . Kidney disease Mother   . Heart disease Father   . Anuerysm Brother 29    brain  . Heart disease Brother    History   Social History  . Marital Status: Married    Spouse Name: N/A    Number of Children: N/A  . Years of Education: N/A   Social History Main Topics  . Smoking status: Current Everyday Smoker -- 2.0 packs/day for 45 years    Types: Cigarettes  . Smokeless tobacco: Never Used  . Alcohol Use: No  . Drug Use: No  .  Sexually Active: None   Other Topics Concern  . None   Social History Narrative  . None   Past Surgical History  Procedure Date  . Appendectomy   . Abdominal hysterectomy   . Tubal ligation   . Spine surgery   . Right hip replacement   . Knee surgeries r knee   . Hammer toe surgery   . Sleep apnea surgery    Past Medical History  Diagnosis Date  . Postlaminectomy syndrome, thoracic region   . Osteoarthrosis, unspecified whether generalized or localized, lower leg   . Dysthymic disorder   . Calcifying tendinitis of shoulder   . Pain in joint, upper arm   . Chronic pain syndrome   . Lumbago   . Primary localized osteoarthrosis, lower leg   . Hypertension   . Hyperlipidemia   . GERD (gastroesophageal reflux disease)   . Thyroid disease   . Restless leg syndrome    There were no vitals taken for this visit.    Review of Systems  Musculoskeletal: Positive for back pain and gait problem.  All other systems reviewed and are negative.       Objective:   Physical Exam Constitutional: She is oriented to person, place, and time. She appears well-developed and well-nourished.  HENT:  Head: Normocephalic.  Eyes: EOM are normal. Pupils are equal, round, and reactive to light.  Neck: Normal range of motion.  Cardiovascular: Normal rate.  Pulmonary/Chest: Effort normal.  Abdominal: Soft.  Musculoskeletal:  Significant crepitus at right knee with mild swelling. Positive joint line tenderness. No instability. Pain with knee flexion passive and active.   Right shoulder is less tender at the subacromial tenderness but RTC and biceps maneuvers were negative. No shoulder instability noted.  Generalized tenderness of c-spine with some limitation in ROM.  Neurological: She is alert and oriented to person, place, and time.  Skin: Skin is warm.  Psychiatric: She has a normal mood and affect. Her behavior is normal. Judgment and thought content normal.    Assessment & Plan:     ASSESSMENT:  1. Chronic lumbar spine pain/post-lami syndrome  2. Osteoarthritis of the knees bilaterally, right greater than left.  3. Rotator cuff syndrome/subacromial bursitis.  4. Restless legs syndrome.  5. O2 dependent sleep apnea.  6. Tobacco abuse.  PLAN:  1.Discusse her pain regimen and the fact that I want to work on consolidating her meds. If nothing else good comes out of this episode, we can reduce her meds. We discussed opioid hypersensitivity. She has been quite compliant over the years, and this is the first problem i have seen. We did discuss the fact that she needs to space out her meds in the evening as well.  2. We will continue with Klonopin for now, but permanently stopped her neurontin and mirapex..  3. The patient needs to resume her Voltaren gel and place this on her  knees and feet at nighttime. She may also use it on shoulder  4. Shoulder exercises were provided today. Only agst gravity  5. Refilled, soma #90, Talwin NX 1 q.8 hours p.r.n. #60 and Duragesic at 75 mcg 1  q.72h hours #10 each without a refill. I advised her to hold on to the 12mcg patches as we may try to wean down to that dose over the next few months.  6. She may see my PA in 2 months.

## 2012-03-12 ENCOUNTER — Ambulatory Visit: Payer: Medicare HMO | Admitting: Physical Medicine and Rehabilitation

## 2012-03-27 ENCOUNTER — Telehealth: Payer: Self-pay

## 2012-03-27 DIAGNOSIS — M75101 Unspecified rotator cuff tear or rupture of right shoulder, not specified as traumatic: Secondary | ICD-10-CM

## 2012-03-27 DIAGNOSIS — M170 Bilateral primary osteoarthritis of knee: Secondary | ICD-10-CM

## 2012-03-27 DIAGNOSIS — M961 Postlaminectomy syndrome, not elsewhere classified: Secondary | ICD-10-CM

## 2012-03-27 MED ORDER — PENTAZOCINE-NALOXONE 50-0.5 MG PO TABS: 1.0000 | ORAL_TABLET | Freq: Three times a day (TID) | ORAL | Status: DC | PRN

## 2012-03-27 MED ORDER — DICLOFENAC SODIUM 1 % TD GEL: 1.0000 "application " | Freq: Three times a day (TID) | TRANSDERMAL | Status: DC

## 2012-03-27 NOTE — Telephone Encounter (Signed)
Pt is already on several medications. Any suggestions for her?

## 2012-03-27 NOTE — Telephone Encounter (Signed)
Should apply the diclofenac 4x a day, also ice or heat whatever gives her more relief

## 2012-03-27 NOTE — Telephone Encounter (Signed)
Pt is having increased knee pain and would like to know what to do.  She knows she needs surgery but is still hesitant.  She wants to know if there is anything else she can do.  [id number]

## 2012-03-27 NOTE — Telephone Encounter (Signed)
Pt aware of Karen's recommendations.

## 2012-04-03 ENCOUNTER — Encounter: Payer: Self-pay | Admitting: Physical Medicine and Rehabilitation

## 2012-04-03 ENCOUNTER — Encounter: Payer: Medicare HMO | Attending: Physical Medicine and Rehabilitation | Admitting: Physical Medicine and Rehabilitation

## 2012-04-03 DIAGNOSIS — M25559 Pain in unspecified hip: Secondary | ICD-10-CM | POA: Insufficient documentation

## 2012-04-03 DIAGNOSIS — M75101 Unspecified rotator cuff tear or rupture of right shoulder, not specified as traumatic: Secondary | ICD-10-CM

## 2012-04-03 DIAGNOSIS — M67919 Unspecified disorder of synovium and tendon, unspecified shoulder: Secondary | ICD-10-CM

## 2012-04-03 DIAGNOSIS — M961 Postlaminectomy syndrome, not elsewhere classified: Secondary | ICD-10-CM

## 2012-04-03 DIAGNOSIS — IMO0002 Reserved for concepts with insufficient information to code with codable children: Secondary | ICD-10-CM

## 2012-04-03 DIAGNOSIS — G2581 Restless legs syndrome: Secondary | ICD-10-CM | POA: Insufficient documentation

## 2012-04-03 DIAGNOSIS — M1710 Unilateral primary osteoarthritis, unspecified knee: Secondary | ICD-10-CM | POA: Insufficient documentation

## 2012-04-03 DIAGNOSIS — M170 Bilateral primary osteoarthritis of knee: Secondary | ICD-10-CM

## 2012-04-03 MED ORDER — FENTANYL 75 MCG/HR TD PT72: 1.0000 | MEDICATED_PATCH | TRANSDERMAL | Status: DC

## 2012-04-03 NOTE — Progress Notes (Signed)
Subjective:    Patient ID: Jill Phillips, female    DOB: 08-18-1956, 56 y.o.   MRN: [id number]  HPI The patient complains about chronic knee pain . The patient denies any radiation.  The problem has gotten worse. The patient reports that she was out of her fentanyl patches 3 days early, and then she applied two of her 50 mcg fentanyl patches, she still had, prescribed by her primary care physician. She reports that her knee pain is so severe now that she would like [place] to refer her to an orthopedic surgeon for possible knee replacement. Pain Inventory Average Pain 7 Pain Right Now 8 My pain is sharp, stabbing and aching  In the last 24 hours, has pain interfered with the following? General activity 10 Relation with others 10 Enjoyment of life 10 What TIME of day is your pain at its worst? all the time Sleep (in general) Poor  Pain is worse with: walking, sitting, standing and some activites Pain improves with: rest, heat/ice and medication Relief from Meds: 8  Mobility walk without assistance how many minutes can you walk? 10-15 ability to climb steps?  no do you drive?  no transfers alone Do you have any goals in this area?  yes  Function not employed: date last employed  I need assistance with the following:  household duties and shopping Do you have any goals in this area?  yes  Neuro/Psych No problems in this area  Prior Studies Any changes since last visit?  no  Physicians involved in your care Any changes since last visit?  no   Family History  Problem Relation Age of Onset  . Kidney disease Mother   . Heart disease Father   . Anuerysm Brother 29    brain  . Heart disease Brother    History   Social History  . Marital Status: Married    Spouse Name: N/A    Number of Children: N/A  . Years of Education: N/A   Social History Main Topics  . Smoking status: Current Everyday Smoker -- 2.0 packs/day for 45 years    Types: Cigarettes  . Smokeless  tobacco: Never Used  . Alcohol Use: No  . Drug Use: No  . Sexually Active: None   Other Topics Concern  . None   Social History Narrative  . None   Past Surgical History  Procedure Date  . Appendectomy   . Abdominal hysterectomy   . Tubal ligation   . Spine surgery   . Right hip replacement   . Knee surgeries r knee   . Hammer toe surgery   . Sleep apnea surgery    Past Medical History  Diagnosis Date  . Postlaminectomy syndrome, thoracic region   . Osteoarthrosis, unspecified whether generalized or localized, lower leg   . Dysthymic disorder   . Calcifying tendinitis of shoulder   . Pain in joint, upper arm   . Chronic pain syndrome   . Lumbago   . Primary localized osteoarthrosis, lower leg   . Hypertension   . Hyperlipidemia   . GERD (gastroesophageal reflux disease)   . Thyroid disease   . Restless leg syndrome    There were no vitals taken for this visit.     Review of Systems  Musculoskeletal: Positive for myalgias, back pain, arthralgias and gait problem.  All other systems reviewed and are negative.       Objective:   Physical Exam  Constitutional: She is oriented to person,  place, and time. She appears well-developed and well-nourished.  HENT:  Head: Normocephalic.  Musculoskeletal: She exhibits tenderness.  Neurological: She is alert and oriented to person, place, and time.  Skin: Skin is warm and dry.  Psychiatric: She has a normal mood and affect.   Symmetric normal motor tone is noted throughout. Normal muscle bulk. Muscle testing reveals 5/5 muscle strength of the upper extremity, and 5/5 of the lower extremity. Full range of motion in upper and lower extremities, except decrease ROM in right knee, extension deficit. ROM of spine is restricted. Fine motor movements are normal in both hands.  DTR in the upper and lower extremity are present and symmetric 2+, except left patella reflex 0. No clonus is noted.  Patient arises from chair with  mild difficulty. Narrow based gait with normal arm swing bilateral.       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 Bilateral hip pain  Plan : Educated the patient that she should not just increase her narcotics, or take more of the narcotics we prescribed her. I informed the patient that I will discuss her case with Dr. Naaman Plummer, because she is on several CNS depressing medication , and it is imperative that she takes the medication as it is prescribed. The patient gave me the rest of her fentanyl 50 mcg patches and we destroyed those.  I refilled her fentanyl 75 mcg patches, and consider to decrease the dosage to 50 mcg after I spoke to Dr. Naaman Plummer, at the next visit.  Referral to an orthopedic surgeon for possible knee replacement on the right. Follow up in 1 month.

## 2012-04-03 NOTE — Patient Instructions (Signed)
Continue using the Voltaren gel 4 times per day, follow up with orthopedic surgeon, for your knee arthritis.

## 2012-04-06 ENCOUNTER — Telehealth: Payer: Self-pay | Admitting: Physical Medicine & Rehabilitation

## 2012-04-06 ENCOUNTER — Telehealth: Payer: Self-pay

## 2012-04-06 ENCOUNTER — Other Ambulatory Visit: Payer: Self-pay | Admitting: Physical Medicine and Rehabilitation

## 2012-04-06 NOTE — Telephone Encounter (Signed)
returning call

## 2012-04-06 NOTE — Telephone Encounter (Signed)
Please call patient, I talked to Dr. Naaman Plummer, the patient got a new med from her psych, alprazolam. Dr. Naaman Plummer said the patient should not take this med until she has talked to her psych and tells him what other meds she is on.  Lm for pt to call office.

## 2012-04-06 NOTE — Telephone Encounter (Signed)
Pt aware to let him know of her meds.

## 2012-04-16 ENCOUNTER — Other Ambulatory Visit: Payer: Self-pay

## 2012-04-16 MED ORDER — CLONAZEPAM 1 MG PO TABS: 1.0000 mg | ORAL_TABLET | Freq: Two times a day (BID) | ORAL | Status: DC | PRN

## 2012-04-19 ENCOUNTER — Ambulatory Visit (INDEPENDENT_AMBULATORY_CARE_PROVIDER_SITE_OTHER): Payer: Medicare HMO | Admitting: Physician Assistant

## 2012-04-19 VITALS — BP 129/75 | HR 108 | Temp 98.2°F | Resp 20 | Ht 64.0 in | Wt 139.0 lb

## 2012-04-19 DIAGNOSIS — F17200 Nicotine dependence, unspecified, uncomplicated: Secondary | ICD-10-CM

## 2012-04-19 DIAGNOSIS — R300 Dysuria: Secondary | ICD-10-CM

## 2012-04-19 DIAGNOSIS — N390 Urinary tract infection, site not specified: Secondary | ICD-10-CM

## 2012-04-19 LAB — POCT UA - MICROSCOPIC ONLY
Casts, Ur, LPF, POC: NEGATIVE
Crystals, Ur, HPF, POC: NEGATIVE
Mucus, UA: NEGATIVE
Yeast, UA: NEGATIVE

## 2012-04-19 LAB — POCT URINALYSIS DIPSTICK
Blood, UA: NEGATIVE
Glucose, UA: 250
Ketones, UA: 40
Nitrite, UA: POSITIVE
Protein, UA: NEGATIVE
Spec Grav, UA: 1.02
Urobilinogen, UA: 0.2
pH, UA: 7

## 2012-04-19 MED ORDER — CIPROFLOXACIN HCL 500 MG PO TABS: 500.0000 mg | ORAL_TABLET | Freq: Two times a day (BID) | ORAL | Status: AC

## 2012-04-19 NOTE — Progress Notes (Signed)
  Subjective:    Patient ID: Jill Phillips, female    DOB: 1956-08-10, 56 y.o.   MRN: [id number]  HPI 56 yr old CF presents with dysuria and pelvic pressure that started yesterday.  All week she has been leaking urine when her bladder is full and she gets up to go to the bathroom. No f/c.  No back/flank pain other than the usual pain she has and sees a pain specialist for. No N/V/D. She had a UTI not too long ago (a few months ago).  Smokes 1 1/2 ppd.  Not ready to quit, but she knows she needs to.  Review of Systems  All other systems reviewed and are negative.       Objective:   Physical Exam  Nursing note and vitals reviewed. Constitutional: She is oriented to person, place, and time. She appears well-developed and well-nourished.  HENT:  Head: Atraumatic.  Cardiovascular: Normal rate, regular rhythm and normal heart sounds.   Pulmonary/Chest: Effort normal. She has wheezes (occasional mild wheezes throughout).  Neurological: She is alert and oriented to person, place, and time.  Skin: Skin is warm and dry.  Psychiatric: She has a normal mood and affect. Her behavior is normal.     Results for orders placed in visit on 04/19/12  POCT UA - MICROSCOPIC ONLY      Component Value Range   WBC, Ur, HPF, POC 2-6     RBC, urine, microscopic 0-2     Bacteria, U Microscopic trace     Mucus, UA neg     Epithelial cells, urine per micros 1-6     Crystals, Ur, HPF, POC neg     Casts, Ur, LPF, POC neg     Yeast, UA neg    POCT URINALYSIS DIPSTICK      Component Value Range   Color, UA red     Clarity, UA clear     Glucose, UA 250     Bilirubin, UA large     Ketones, UA 40     Spec Grav, UA 1.020     Blood, UA neg     pH, UA 7.0     Protein, UA neg     Urobilinogen, UA 0.2     Nitrite, UA pos     Leukocytes, UA large (3+)          Assessment & Plan:  UTI-start cipro. Sending for culture Smoker-smoking cessation info provided.  Patient not ready.

## 2012-04-20 ENCOUNTER — Other Ambulatory Visit: Payer: Self-pay | Admitting: *Deleted

## 2012-04-20 DIAGNOSIS — M75101 Unspecified rotator cuff tear or rupture of right shoulder, not specified as traumatic: Secondary | ICD-10-CM

## 2012-04-20 DIAGNOSIS — M170 Bilateral primary osteoarthritis of knee: Secondary | ICD-10-CM

## 2012-04-20 DIAGNOSIS — G2581 Restless legs syndrome: Secondary | ICD-10-CM

## 2012-04-20 DIAGNOSIS — M961 Postlaminectomy syndrome, not elsewhere classified: Secondary | ICD-10-CM

## 2012-04-20 LAB — URINE CULTURE: Colony Count: 35000

## 2012-04-20 MED ORDER — CLONAZEPAM 1 MG PO TABS: 1.0000 mg | ORAL_TABLET | Freq: Two times a day (BID) | ORAL | Status: DC | PRN

## 2012-04-20 MED ORDER — CARISOPRODOL 350 MG PO TABS: 350.0000 mg | ORAL_TABLET | Freq: Three times a day (TID) | ORAL | Status: DC | PRN

## 2012-04-21 ENCOUNTER — Telehealth: Payer: Self-pay | Admitting: *Deleted

## 2012-04-21 DIAGNOSIS — M170 Bilateral primary osteoarthritis of knee: Secondary | ICD-10-CM

## 2012-04-21 DIAGNOSIS — M961 Postlaminectomy syndrome, not elsewhere classified: Secondary | ICD-10-CM

## 2012-04-21 DIAGNOSIS — M75101 Unspecified rotator cuff tear or rupture of right shoulder, not specified as traumatic: Secondary | ICD-10-CM

## 2012-04-21 MED ORDER — PENTAZOCINE-NALOXONE 50-0.5 MG PO TABS: 1.0000 | ORAL_TABLET | Freq: Three times a day (TID) | ORAL | Status: DC | PRN

## 2012-04-21 NOTE — Telephone Encounter (Signed)
Out of soma and klonopin. Having orthopedic surgery 04/29/12.  Knee cap is shifted to the left and she has been in so much pain but unable to get surgery any earlier.   I spoke with Amiera and verified that her klonopin and soma had been called in to [facility] 04/20/12 and I ok'd refill on her Talwin today.

## 2012-04-30 ENCOUNTER — Telehealth: Payer: Self-pay | Admitting: Physical Medicine & Rehabilitation

## 2012-04-30 DIAGNOSIS — G2581 Restless legs syndrome: Secondary | ICD-10-CM

## 2012-04-30 DIAGNOSIS — M170 Bilateral primary osteoarthritis of knee: Secondary | ICD-10-CM

## 2012-04-30 DIAGNOSIS — M961 Postlaminectomy syndrome, not elsewhere classified: Secondary | ICD-10-CM

## 2012-04-30 DIAGNOSIS — M75101 Unspecified rotator cuff tear or rupture of right shoulder, not specified as traumatic: Secondary | ICD-10-CM

## 2012-04-30 MED ORDER — FENTANYL 75 MCG/HR TD PT72: 1.0000 | MEDICATED_PATCH | TRANSDERMAL | Status: AC

## 2012-04-30 NOTE — Telephone Encounter (Signed)
Rx is ready to be picked up, pt aware.

## 2012-04-30 NOTE — Telephone Encounter (Signed)
Printed for Karen to sign 

## 2012-04-30 NOTE — Telephone Encounter (Signed)
Will not have patch for Monday.

## 2012-05-05 ENCOUNTER — Other Ambulatory Visit: Payer: Self-pay | Admitting: Physical Medicine and Rehabilitation

## 2012-05-05 ENCOUNTER — Encounter: Payer: Medicare HMO | Attending: Physical Medicine and Rehabilitation | Admitting: Physical Medicine and Rehabilitation

## 2012-05-05 ENCOUNTER — Encounter: Payer: Self-pay | Admitting: Physical Medicine and Rehabilitation

## 2012-05-05 VITALS — BP 148/90 | HR 76 | Resp 16 | Ht 64.0 in | Wt 144.0 lb

## 2012-05-05 DIAGNOSIS — M1710 Unilateral primary osteoarthritis, unspecified knee: Secondary | ICD-10-CM | POA: Insufficient documentation

## 2012-05-05 DIAGNOSIS — Z96649 Presence of unspecified artificial hip joint: Secondary | ICD-10-CM | POA: Insufficient documentation

## 2012-05-05 DIAGNOSIS — E785 Hyperlipidemia, unspecified: Secondary | ICD-10-CM | POA: Insufficient documentation

## 2012-05-05 DIAGNOSIS — M545 Low back pain, unspecified: Secondary | ICD-10-CM

## 2012-05-05 DIAGNOSIS — K219 Gastro-esophageal reflux disease without esophagitis: Secondary | ICD-10-CM | POA: Insufficient documentation

## 2012-05-05 DIAGNOSIS — M25569 Pain in unspecified knee: Secondary | ICD-10-CM

## 2012-05-05 DIAGNOSIS — M1711 Unilateral primary osteoarthritis, right knee: Secondary | ICD-10-CM

## 2012-05-05 DIAGNOSIS — IMO0002 Reserved for concepts with insufficient information to code with codable children: Secondary | ICD-10-CM

## 2012-05-05 DIAGNOSIS — M961 Postlaminectomy syndrome, not elsewhere classified: Secondary | ICD-10-CM | POA: Insufficient documentation

## 2012-05-05 DIAGNOSIS — I10 Essential (primary) hypertension: Secondary | ICD-10-CM | POA: Insufficient documentation

## 2012-05-05 DIAGNOSIS — F17200 Nicotine dependence, unspecified, uncomplicated: Secondary | ICD-10-CM | POA: Insufficient documentation

## 2012-05-05 DIAGNOSIS — G8929 Other chronic pain: Secondary | ICD-10-CM | POA: Insufficient documentation

## 2012-05-05 DIAGNOSIS — G2581 Restless legs syndrome: Secondary | ICD-10-CM | POA: Insufficient documentation

## 2012-05-05 DIAGNOSIS — M25559 Pain in unspecified hip: Secondary | ICD-10-CM | POA: Insufficient documentation

## 2012-05-05 DIAGNOSIS — M25561 Pain in right knee: Secondary | ICD-10-CM

## 2012-05-05 NOTE — Progress Notes (Signed)
Subjective:    Patient ID: Jill Phillips, female    DOB: 09/04/1955, 56 y.o.   MRN: [id number]  HPI The patient complains about chronic knee pain . The patient denies any radiation.  The problem has gotten worse. The patient reports that she already picked up her prescription for her fentanyl patch, and filled it on the 30th.  She reports that she saw her orthopedic surgeon for possible knee replacement,Dr. Nitka,but he can not do her surgery, because of personal health issues, he referred her to another surgeon in his office, she was not sure about his name. She states, that she will see him this week.  Pain Inventory Average Pain 8 Pain Right Now 8 My pain is stabbing and aching  In the last 24 hours, has pain interfered with the following? General activity 9 Relation with others 9 Enjoyment of life 10 What TIME of day is your pain at its worst? All Day Sleep (in general) Poor  Pain is worse with: walking, bending and standing Pain improves with: rest and medication Relief from Meds: 7  Mobility walk without assistance  Function disabled: date disabled  I need assistance with the following:  household duties  Neuro/Psych weakness trouble walking  Prior Studies Any changes since last visit?  no  Physicians involved in your care Any changes since last visit?  no   Family History  Problem Relation Age of Onset  . Kidney disease Mother   . Heart disease Father   . Anuerysm Brother 29    brain  . Heart disease Brother    History   Social History  . Marital Status: Divorced    Spouse Name: N/A    Number of Children: N/A  . Years of Education: N/A   Social History Main Topics  . Smoking status: Current Everyday Smoker -- 1.5 packs/day for 45 years    Types: Cigarettes  . Smokeless tobacco: Never Used  . Alcohol Use: No  . Drug Use: No  . Sexually Active: None   Other Topics Concern  . None   Social History Narrative  . None   Past Surgical History    Procedure Date  . Appendectomy   . Abdominal hysterectomy   . Tubal ligation   . Spine surgery   . Right hip replacement   . Knee surgeries r knee   . Hammer toe surgery   . Sleep apnea surgery   . Cholecystectomy    Past Medical History  Diagnosis Date  . Postlaminectomy syndrome, thoracic region   . Osteoarthrosis, unspecified whether generalized or localized, lower leg   . Dysthymic disorder   . Calcifying tendinitis of shoulder   . Pain in joint, upper arm   . Chronic pain syndrome   . Lumbago   . Primary localized osteoarthrosis, lower leg   . Hypertension   . Hyperlipidemia   . GERD (gastroesophageal reflux disease)   . Thyroid disease   . Restless leg syndrome    BP 148/90  Pulse 76  Resp 16  Ht 5\' 4"  (1.626 m)  Wt 144 lb (65.318 kg)  BMI 24.72 kg/m2  SpO2 92%      Review of Systems  Constitutional: Negative.   HENT: Negative.   Eyes: Negative.   Respiratory: Negative.   Cardiovascular: Negative.   Gastrointestinal: Negative.   Genitourinary: Negative.   Musculoskeletal: Positive for back pain and gait problem.  Skin: Negative.   Neurological: Negative.   Hematological: Negative.   Psychiatric/Behavioral: Negative.  Objective:   Physical Exam  Constitutional: She is oriented to person, place, and time. She appears well-developed and well-nourished.  HENT:  Head: Normocephalic.  Musculoskeletal: She exhibits tenderness.  Neurological: She is alert and oriented to person, place, and time.  Skin: Skin is warm and dry.  Psychiatric: She has a normal mood and affect.  Symmetric normal motor tone is noted throughout. Normal muscle bulk. Muscle testing reveals 5/5 muscle strength of the upper extremity, and 5/5 of the lower extremity. Full range of motion in upper and lower extremities, except decrease ROM in right knee, extension deficit. ROM of spine is restricted. Fine motor movements are normal in both hands.  DTR in the upper and lower  extremity are present and symmetric 2+, except left patella reflex 0. No clonus is noted.  Patient arises from chair with mild difficulty. Narrow based gait with normal arm swing bilateral.       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 Bilateral hip pain  Plan :  Follow up with orthopedic surgeon for possible knee replacement, continue with medication, continue staying as active as possible.  Follow up in 1 month.

## 2012-05-05 NOTE — Addendum Note (Signed)
Addended by: Horatio Pel on: 05/05/2012 03:04 PM   Modules accepted: Orders

## 2012-05-05 NOTE — Patient Instructions (Signed)
Follow up with PCP after having been treated for a bladder infection 2 weeks ago .

## 2012-05-06 ENCOUNTER — Other Ambulatory Visit (HOSPITAL_COMMUNITY): Payer: Self-pay | Admitting: Orthopaedic Surgery

## 2012-05-13 ENCOUNTER — Other Ambulatory Visit: Payer: Self-pay

## 2012-05-13 DIAGNOSIS — M75101 Unspecified rotator cuff tear or rupture of right shoulder, not specified as traumatic: Secondary | ICD-10-CM

## 2012-05-13 DIAGNOSIS — M170 Bilateral primary osteoarthritis of knee: Secondary | ICD-10-CM

## 2012-05-13 DIAGNOSIS — M961 Postlaminectomy syndrome, not elsewhere classified: Secondary | ICD-10-CM

## 2012-05-13 MED ORDER — PENTAZOCINE-NALOXONE 50-0.5 MG PO TABS: 1.0000 | ORAL_TABLET | Freq: Three times a day (TID) | ORAL | Status: DC | PRN

## 2012-05-25 ENCOUNTER — Encounter (HOSPITAL_COMMUNITY): Payer: Self-pay | Admitting: Pharmacy Technician

## 2012-05-26 ENCOUNTER — Encounter (HOSPITAL_COMMUNITY): Payer: Self-pay

## 2012-05-26 ENCOUNTER — Encounter (HOSPITAL_COMMUNITY)
Admission: RE | Admit: 2012-05-26 | Discharge: 2012-05-26 | Disposition: A | Discharge status: Home / Self Care | Payer: Medicare HMO | Source: Ambulatory Visit | Attending: Orthopaedic Surgery | Admitting: Orthopaedic Surgery

## 2012-05-26 ENCOUNTER — Ambulatory Visit (HOSPITAL_COMMUNITY)
Admission: RE | Admit: 2012-05-26 | Discharge: 2012-05-26 | Disposition: A | Discharge status: Home / Self Care | Payer: Medicare HMO | Source: Ambulatory Visit | Attending: Orthopaedic Surgery | Admitting: Orthopaedic Surgery

## 2012-05-26 DIAGNOSIS — M1710 Unilateral primary osteoarthritis, unspecified knee: Secondary | ICD-10-CM | POA: Insufficient documentation

## 2012-05-26 DIAGNOSIS — Z01810 Encounter for preprocedural cardiovascular examination: Secondary | ICD-10-CM | POA: Insufficient documentation

## 2012-05-26 DIAGNOSIS — Z01812 Encounter for preprocedural laboratory examination: Secondary | ICD-10-CM | POA: Insufficient documentation

## 2012-05-26 HISTORY — DX: Cardiac murmur, unspecified: R01.1

## 2012-05-26 HISTORY — DX: Hypothyroidism, unspecified: E03.9

## 2012-05-26 HISTORY — DX: Personal history of other medical treatment: Z92.89

## 2012-05-26 HISTORY — DX: Nausea with vomiting, unspecified: Z98.890

## 2012-05-26 HISTORY — DX: Nausea with vomiting, unspecified: R11.2

## 2012-05-26 HISTORY — DX: Sleep apnea, unspecified: G47.30

## 2012-05-26 LAB — URINALYSIS, ROUTINE W REFLEX MICROSCOPIC
Glucose, UA: NEGATIVE mg/dL
Hgb urine dipstick: NEGATIVE
Ketones, ur: NEGATIVE mg/dL
Leukocytes, UA: NEGATIVE
Nitrite: NEGATIVE
Protein, ur: NEGATIVE mg/dL
Specific Gravity, Urine: 1.028 (ref 1.005–1.030)
Urobilinogen, UA: 0.2 mg/dL (ref 0.0–1.0)
pH: 5.5 (ref 5.0–8.0)

## 2012-05-26 LAB — BASIC METABOLIC PANEL
BUN: 22 mg/dL (ref 6–23)
CO2: 31 mEq/L (ref 19–32)
Calcium: 9.8 mg/dL (ref 8.4–10.5)
Chloride: 101 mEq/L (ref 96–112)
Creatinine, Ser: 0.97 mg/dL (ref 0.50–1.10)
GFR calc Af Amer: 74 mL/min — ABNORMAL LOW (ref >90)
GFR calc non Af Amer: 64 mL/min — ABNORMAL LOW (ref >90)
Glucose, Bld: 93 mg/dL (ref 70–99)
Potassium: 4.7 mEq/L (ref 3.5–5.1)
Sodium: 142 mEq/L (ref 135–145)

## 2012-05-26 LAB — APTT: aPTT: 30 seconds (ref 24–37)

## 2012-05-26 LAB — CBC
HCT: 36.2 % (ref 36.0–46.0)
Hemoglobin: 12 g/dL (ref 12.0–15.0)
MCH: 30.5 pg (ref 26.0–34.0)
MCHC: 33.1 g/dL (ref 30.0–36.0)
MCV: 91.9 fL (ref 78.0–100.0)
Platelets: 288 10*3/uL (ref 150–400)
RBC: 3.94 MIL/uL (ref 3.87–5.11)
RDW: 13.3 % (ref 11.5–15.5)
WBC: 7.6 10*3/uL (ref 4.0–10.5)

## 2012-05-26 LAB — SURGICAL PCR SCREEN
MRSA, PCR: NEGATIVE
Staphylococcus aureus: NEGATIVE

## 2012-05-26 LAB — PROTIME-INR
INR: 0.87 (ref 0.00–1.49)
Prothrombin Time: 11.8 seconds (ref 11.6–15.2)

## 2012-05-26 NOTE — Patient Instructions (Addendum)
20 Jill Phillips  05/26/2012   Your procedure is scheduled on:  05/29/12  Friday    Surgery 1230-1450  Report to [facility] at   1000    AM.  Call this number if you have problems the morning of surgery: [phone]     Or PST   [id number]  [facility]   Remember:   Do not eat food or drink any fluids :After Midnight.  Thursday NIGHT   BRING CPAP MASK AND TUBING WITH YOU TO HOSPITAL  Take these medicines the morning of surgery with A SIP OF WATER: Clonidine                                         May use Fentanyl patch,  May take Xanax, or Soma or Talwin or Phenergran  If needed   Do not wear jewelry, make-up or nail polish.  Do not wear lotions, powders, or perfumes. You may wear deodorant.  Do not shave 48 hours prior to surgery.  Do not bring valuables to the hospital.  Contacts, dentures or bridgework may not be worn into surgery.  Leave suitcase in the car. After surgery it may be brought to your room.  For patients admitted to the hospital, checkout time is 11:00 AM the day of discharge.   Patients discharged the day of surgery will not be allowed to drive home.  Name and phone number of your driver:       DAVID                                                               Special Instructions: CHG Shower Use Special Wash:   SEE ATTATCHED INSTRUCTION SHEET REGARDING SHOWERS                REGULAR SOAP FACE AND PRIVATES              LADIES- NO SHAVING 48 HOURS BEFORE USING BETASEPT SOAP.                Please read overY the following fact sheets that you were given: MRSA Information

## 2012-05-27 NOTE — Pre-Procedure Instructions (Signed)
EKG reviewed by dr Marcell Barlow- OK

## 2012-05-29 ENCOUNTER — Ambulatory Visit (HOSPITAL_COMMUNITY): Payer: Medicare HMO | Admitting: Anesthesiology

## 2012-05-29 ENCOUNTER — Encounter (HOSPITAL_COMMUNITY): Payer: Self-pay | Admitting: Anesthesiology

## 2012-05-29 ENCOUNTER — Inpatient Hospital Stay (HOSPITAL_COMMUNITY)
Admission: RE | Admit: 2012-05-29 | Discharge: 2012-06-01 | DRG: 470 | Disposition: A | Discharge status: Home Health | Payer: Medicare HMO | Source: Ambulatory Visit | Attending: Orthopaedic Surgery | Admitting: Orthopaedic Surgery

## 2012-05-29 ENCOUNTER — Other Ambulatory Visit (HOSPITAL_COMMUNITY): Payer: Medicare HMO

## 2012-05-29 ENCOUNTER — Encounter (HOSPITAL_COMMUNITY): Payer: Self-pay | Admitting: *Deleted

## 2012-05-29 ENCOUNTER — Inpatient Hospital Stay (HOSPITAL_COMMUNITY): Payer: Medicare HMO

## 2012-05-29 ENCOUNTER — Encounter (HOSPITAL_COMMUNITY)
Admission: RE | Disposition: A | Discharge status: Home Health | Payer: Self-pay | Source: Ambulatory Visit | Attending: Orthopaedic Surgery

## 2012-05-29 ENCOUNTER — Inpatient Hospital Stay (HOSPITAL_COMMUNITY): Admission: RE | Admit: 2012-05-29 | Payer: Medicare HMO | Source: Ambulatory Visit

## 2012-05-29 DIAGNOSIS — F341 Dysthymic disorder: Secondary | ICD-10-CM | POA: Diagnosis present

## 2012-05-29 DIAGNOSIS — I10 Essential (primary) hypertension: Secondary | ICD-10-CM | POA: Diagnosis present

## 2012-05-29 DIAGNOSIS — M75101 Unspecified rotator cuff tear or rupture of right shoulder, not specified as traumatic: Secondary | ICD-10-CM

## 2012-05-29 DIAGNOSIS — M1711 Unilateral primary osteoarthritis, right knee: Secondary | ICD-10-CM

## 2012-05-29 DIAGNOSIS — Z881 Allergy status to other antibiotic agents status: Secondary | ICD-10-CM

## 2012-05-29 DIAGNOSIS — M170 Bilateral primary osteoarthritis of knee: Secondary | ICD-10-CM

## 2012-05-29 DIAGNOSIS — Z90710 Acquired absence of both cervix and uterus: Secondary | ICD-10-CM

## 2012-05-29 DIAGNOSIS — F17200 Nicotine dependence, unspecified, uncomplicated: Secondary | ICD-10-CM | POA: Diagnosis present

## 2012-05-29 DIAGNOSIS — G894 Chronic pain syndrome: Secondary | ICD-10-CM | POA: Diagnosis present

## 2012-05-29 DIAGNOSIS — K219 Gastro-esophageal reflux disease without esophagitis: Secondary | ICD-10-CM | POA: Diagnosis present

## 2012-05-29 DIAGNOSIS — Z96649 Presence of unspecified artificial hip joint: Secondary | ICD-10-CM

## 2012-05-29 DIAGNOSIS — Z885 Allergy status to narcotic agent status: Secondary | ICD-10-CM

## 2012-05-29 DIAGNOSIS — E039 Hypothyroidism, unspecified: Secondary | ICD-10-CM | POA: Diagnosis present

## 2012-05-29 DIAGNOSIS — Z79899 Other long term (current) drug therapy: Secondary | ICD-10-CM

## 2012-05-29 DIAGNOSIS — Z888 Allergy status to other drugs, medicaments and biological substances status: Secondary | ICD-10-CM

## 2012-05-29 DIAGNOSIS — Z9089 Acquired absence of other organs: Secondary | ICD-10-CM

## 2012-05-29 DIAGNOSIS — M961 Postlaminectomy syndrome, not elsewhere classified: Secondary | ICD-10-CM | POA: Diagnosis present

## 2012-05-29 DIAGNOSIS — G4730 Sleep apnea, unspecified: Secondary | ICD-10-CM | POA: Diagnosis present

## 2012-05-29 DIAGNOSIS — E785 Hyperlipidemia, unspecified: Secondary | ICD-10-CM | POA: Diagnosis present

## 2012-05-29 DIAGNOSIS — D62 Acute posthemorrhagic anemia: Secondary | ICD-10-CM | POA: Diagnosis not present

## 2012-05-29 DIAGNOSIS — Z882 Allergy status to sulfonamides status: Secondary | ICD-10-CM

## 2012-05-29 DIAGNOSIS — M1710 Unilateral primary osteoarthritis, unspecified knee: Principal | ICD-10-CM | POA: Diagnosis present

## 2012-05-29 DIAGNOSIS — G2581 Restless legs syndrome: Secondary | ICD-10-CM

## 2012-05-29 HISTORY — PX: TOTAL KNEE ARTHROPLASTY: SHX125

## 2012-05-29 SURGERY — ARTHROPLASTY, KNEE, TOTAL
Anesthesia: Spinal | Site: Knee | Laterality: Right | Wound class: Clean

## 2012-05-29 MED ORDER — SODIUM CHLORIDE 0.9 % IR SOLN
Status: DC | PRN
  Administered 2012-05-29: 3000 mL

## 2012-05-29 MED ORDER — ONDANSETRON HCL 4 MG/2ML IJ SOLN: 4.0000 mg | Freq: Four times a day (QID) | INTRAMUSCULAR | Status: DC | PRN

## 2012-05-29 MED ORDER — CLONAZEPAM 1 MG PO TABS
2.0000 mg | ORAL_TABLET | Freq: Every day | ORAL | Status: DC
  Administered 2012-05-30 – 2012-05-31 (×2): 2 mg via ORAL
  Filled 2012-05-29 (×2): qty 2

## 2012-05-29 MED ORDER — MIDAZOLAM HCL 2 MG/2ML IJ SOLN
INTRAMUSCULAR | Status: AC
  Filled 2012-05-29: qty 2

## 2012-05-29 MED ORDER — HYDROMORPHONE 0.3 MG/ML IV SOLN
INTRAVENOUS | Status: DC
  Administered 2012-05-29: 16:00:00 via INTRAVENOUS
  Administered 2012-05-30: 0.3 mg via INTRAVENOUS
  Administered 2012-05-30: 06:00:00 via INTRAVENOUS
  Administered 2012-05-30: 3 mg via INTRAVENOUS
  Administered 2012-05-30 (×2): 2.1 mg via INTRAVENOUS
  Administered 2012-05-30: 17:00:00 via INTRAVENOUS
  Administered 2012-05-30: 2.4 mg via INTRAVENOUS
  Administered 2012-05-30: 4.2 mg via INTRAVENOUS
  Administered 2012-05-31: 0.9 mg via INTRAVENOUS
  Administered 2012-05-31: 1.2 mg via INTRAVENOUS
  Administered 2012-05-31: 03:00:00 via INTRAVENOUS
  Administered 2012-05-31: 3.6 mg via INTRAVENOUS
  Administered 2012-05-31: 1.2 mg via INTRAVENOUS
  Administered 2012-05-31 – 2012-06-01 (×2): 0.9 mg via INTRAVENOUS
  Administered 2012-06-01: 2.4 mg via INTRAVENOUS
  Administered 2012-06-01: 02:00:00 via INTRAVENOUS
  Filled 2012-05-29 (×4): qty 25

## 2012-05-29 MED ORDER — DOCUSATE SODIUM 100 MG PO CAPS: 100.0000 mg | ORAL_CAPSULE | Freq: Every day | ORAL | Status: DC

## 2012-05-29 MED ORDER — ZOLPIDEM TARTRATE 5 MG PO TABS: 5.0000 mg | ORAL_TABLET | Freq: Every evening | ORAL | Status: DC | PRN

## 2012-05-29 MED ORDER — ACETAMINOPHEN 650 MG RE SUPP: 650.0000 mg | Freq: Four times a day (QID) | RECTAL | Status: DC | PRN

## 2012-05-29 MED ORDER — FENTANYL CITRATE 0.05 MG/ML IJ SOLN: 25.0000 ug | INTRAMUSCULAR | Status: DC | PRN

## 2012-05-29 MED ORDER — EPHEDRINE SULFATE 50 MG/ML IJ SOLN
INTRAMUSCULAR | Status: DC | PRN
  Administered 2012-05-29 (×2): 10 mg via INTRAVENOUS

## 2012-05-29 MED ORDER — HYDROMORPHONE 0.3 MG/ML IV SOLN
INTRAVENOUS | Status: AC
  Administered 2012-05-29: 2.4 mg via INTRAVENOUS
  Filled 2012-05-29: qty 25

## 2012-05-29 MED ORDER — PROPOFOL 10 MG/ML IV BOLUS
INTRAVENOUS | Status: DC | PRN
  Administered 2012-05-29: 150 mg via INTRAVENOUS

## 2012-05-29 MED ORDER — FENTANYL CITRATE 0.05 MG/ML IJ SOLN
INTRAMUSCULAR | Status: AC
  Filled 2012-05-29: qty 2

## 2012-05-29 MED ORDER — HYDROMORPHONE HCL PF 1 MG/ML IJ SOLN: 1.0000 mg | INTRAMUSCULAR | Status: DC | PRN

## 2012-05-29 MED ORDER — LACTATED RINGERS IV SOLN
INTRAVENOUS | Status: DC
  Administered 2012-05-29: 1000 mL via INTRAVENOUS

## 2012-05-29 MED ORDER — BUPIVACAINE LIPOSOME 1.3 % IJ SUSP
INTRAMUSCULAR | Status: DC | PRN
  Administered 2012-05-29: 20 mL

## 2012-05-29 MED ORDER — ASPIRIN EC 325 MG PO TBEC
325.0000 mg | DELAYED_RELEASE_TABLET | Freq: Two times a day (BID) | ORAL | Status: DC
  Administered 2012-05-30 – 2012-06-01 (×5): 325 mg via ORAL
  Filled 2012-05-29 (×8): qty 1

## 2012-05-29 MED ORDER — LEVOTHYROXINE SODIUM 25 MCG PO TABS
25.0000 ug | ORAL_TABLET | Freq: Every day | ORAL | Status: DC
  Administered 2012-05-29 – 2012-05-31 (×3): 25 ug via ORAL
  Filled 2012-05-29 (×4): qty 1

## 2012-05-29 MED ORDER — METHOCARBAMOL 500 MG PO TABS
500.0000 mg | ORAL_TABLET | Freq: Four times a day (QID) | ORAL | Status: DC | PRN
  Administered 2012-05-30 – 2012-06-01 (×2): 500 mg via ORAL
  Filled 2012-05-29 (×2): qty 1

## 2012-05-29 MED ORDER — AMLODIPINE BESYLATE 5 MG PO TABS
5.0000 mg | ORAL_TABLET | Freq: Every day | ORAL | Status: DC
  Filled 2012-05-29 (×4): qty 1

## 2012-05-29 MED ORDER — VALACYCLOVIR HCL 500 MG PO TABS
500.0000 mg | ORAL_TABLET | Freq: Every day | ORAL | Status: DC
  Administered 2012-05-29 – 2012-05-31 (×3): 500 mg via ORAL
  Filled 2012-05-29 (×4): qty 1

## 2012-05-29 MED ORDER — CLINDAMYCIN PHOSPHATE 900 MG/50ML IV SOLN
900.0000 mg | INTRAVENOUS | Status: AC
  Administered 2012-05-29: 900 mg via INTRAVENOUS
  Filled 2012-05-29: qty 50

## 2012-05-29 MED ORDER — VENLAFAXINE HCL ER 150 MG PO CP24
300.0000 mg | ORAL_CAPSULE | Freq: Every day | ORAL | Status: DC
  Administered 2012-05-29 – 2012-05-31 (×3): 300 mg via ORAL
  Filled 2012-05-29 (×4): qty 2

## 2012-05-29 MED ORDER — DOCUSATE SODIUM 100 MG PO CAPS
100.0000 mg | ORAL_CAPSULE | Freq: Two times a day (BID) | ORAL | Status: DC
  Administered 2012-05-30 – 2012-06-01 (×5): 100 mg via ORAL

## 2012-05-29 MED ORDER — PROMETHAZINE HCL 25 MG/ML IJ SOLN: 6.2500 mg | INTRAMUSCULAR | Status: DC | PRN

## 2012-05-29 MED ORDER — SODIUM CHLORIDE 0.9 % IJ SOLN: 9.0000 mL | INTRAMUSCULAR | Status: DC | PRN

## 2012-05-29 MED ORDER — OXYCODONE HCL 5 MG PO TABS
5.0000 mg | ORAL_TABLET | ORAL | Status: DC | PRN
Start: 2012-05-29 — End: 2012-06-01
  Administered 2012-05-30: 5 mg via ORAL
  Administered 2012-05-30 – 2012-06-01 (×12): 10 mg via ORAL
  Filled 2012-05-29 (×8): qty 2
  Filled 2012-05-29: qty 1
  Filled 2012-05-29 (×4): qty 2

## 2012-05-29 MED ORDER — DIPHENHYDRAMINE HCL 12.5 MG/5ML PO ELIX
12.5000 mg | ORAL_SOLUTION | Freq: Four times a day (QID) | ORAL | Status: DC | PRN
  Filled 2012-05-29: qty 5

## 2012-05-29 MED ORDER — MENTHOL 3 MG MT LOZG
1.0000 | LOZENGE | OROMUCOSAL | Status: DC | PRN
  Filled 2012-05-29: qty 9

## 2012-05-29 MED ORDER — FENTANYL CITRATE 0.05 MG/ML IJ SOLN
100.0000 ug | Freq: Once | INTRAMUSCULAR | Status: AC
Start: 2012-05-29 — End: 2012-05-29
  Administered 2012-05-29: 100 ug via INTRAVENOUS

## 2012-05-29 MED ORDER — PANTOPRAZOLE SODIUM 40 MG PO TBEC
40.0000 mg | DELAYED_RELEASE_TABLET | Freq: Every day | ORAL | Status: DC
  Administered 2012-05-30 – 2012-06-01 (×3): 40 mg via ORAL
  Filled 2012-05-29 (×3): qty 1

## 2012-05-29 MED ORDER — ONDANSETRON HCL 4 MG PO TABS: 4.0000 mg | ORAL_TABLET | Freq: Four times a day (QID) | ORAL | Status: DC | PRN

## 2012-05-29 MED ORDER — SODIUM CHLORIDE 0.9 % IV SOLN
INTRAVENOUS | Status: DC
  Administered 2012-05-29: 18:00:00 via INTRAVENOUS
  Administered 2012-05-30: 1000 mL via INTRAVENOUS

## 2012-05-29 MED ORDER — KETOROLAC TROMETHAMINE 30 MG/ML IJ SOLN: 15.0000 mg | Freq: Once | INTRAMUSCULAR | Status: DC | PRN

## 2012-05-29 MED ORDER — FENTANYL CITRATE 0.05 MG/ML IJ SOLN
INTRAMUSCULAR | Status: DC | PRN
  Administered 2012-05-29 (×2): 50 ug via INTRAVENOUS
  Administered 2012-05-29: 100 ug via INTRAVENOUS
  Administered 2012-05-29 (×3): 50 ug via INTRAVENOUS
  Administered 2012-05-29: 100 ug via INTRAVENOUS
  Administered 2012-05-29: 50 ug via INTRAVENOUS

## 2012-05-29 MED ORDER — FENTANYL 75 MCG/HR TD PT72
75.0000 ug | MEDICATED_PATCH | TRANSDERMAL | Status: DC
  Administered 2012-05-30: 75 ug via TRANSDERMAL
  Filled 2012-05-29: qty 1

## 2012-05-29 MED ORDER — CLINDAMYCIN PHOSPHATE 900 MG/50ML IV SOLN
INTRAVENOUS | Status: AC
  Filled 2012-05-29: qty 50

## 2012-05-29 MED ORDER — ACETAMINOPHEN 325 MG PO TABS
650.0000 mg | ORAL_TABLET | Freq: Four times a day (QID) | ORAL | Status: DC | PRN
  Filled 2012-05-29: qty 2

## 2012-05-29 MED ORDER — LIDOCAINE HCL (CARDIAC) 20 MG/ML IV SOLN
INTRAVENOUS | Status: DC | PRN
  Administered 2012-05-29: 100 mg via INTRAVENOUS

## 2012-05-29 MED ORDER — NALOXONE HCL 0.4 MG/ML IJ SOLN: 0.4000 mg | INTRAMUSCULAR | Status: DC | PRN

## 2012-05-29 MED ORDER — HYDROCHLOROTHIAZIDE 12.5 MG PO CAPS
12.5000 mg | ORAL_CAPSULE | Freq: Every day | ORAL | Status: DC
  Filled 2012-05-29 (×4): qty 1

## 2012-05-29 MED ORDER — METHOCARBAMOL 100 MG/ML IJ SOLN
500.0000 mg | Freq: Four times a day (QID) | INTRAVENOUS | Status: DC | PRN
  Administered 2012-05-29: 500 mg via INTRAVENOUS
  Filled 2012-05-29: qty 5

## 2012-05-29 MED ORDER — KETOROLAC TROMETHAMINE 15 MG/ML IJ SOLN
15.0000 mg | Freq: Four times a day (QID) | INTRAMUSCULAR | Status: AC
  Administered 2012-05-30 (×2): 15 mg via INTRAVENOUS
  Filled 2012-05-29 (×3): qty 1

## 2012-05-29 MED ORDER — ONDANSETRON HCL 4 MG/2ML IJ SOLN
INTRAMUSCULAR | Status: DC | PRN
  Administered 2012-05-29: 4 mg via INTRAVENOUS

## 2012-05-29 MED ORDER — CLONIDINE HCL 0.1 MG PO TABS
0.1000 mg | ORAL_TABLET | Freq: Three times a day (TID) | ORAL | Status: DC
  Administered 2012-05-30 – 2012-05-31 (×3): 0.1 mg via ORAL
  Filled 2012-05-29 (×11): qty 1

## 2012-05-29 MED ORDER — ROPIVACAINE HCL 5 MG/ML IJ SOLN
INTRAMUSCULAR | Status: AC
  Filled 2012-05-29: qty 30

## 2012-05-29 MED ORDER — ALPRAZOLAM 1 MG PO TABS
1.0000 mg | ORAL_TABLET | Freq: Every day | ORAL | Status: DC | PRN
  Administered 2012-05-31: 1 mg via ORAL
  Filled 2012-05-29: qty 1

## 2012-05-29 MED ORDER — CLINDAMYCIN PHOSPHATE 600 MG/50ML IV SOLN
600.0000 mg | Freq: Four times a day (QID) | INTRAVENOUS | Status: AC
  Administered 2012-05-29 – 2012-05-30 (×2): 600 mg via INTRAVENOUS
  Filled 2012-05-29 (×2): qty 50

## 2012-05-29 MED ORDER — BENAZEPRIL HCL 20 MG PO TABS
20.0000 mg | ORAL_TABLET | Freq: Every day | ORAL | Status: DC
  Filled 2012-05-29 (×4): qty 1

## 2012-05-29 MED ORDER — BUPIVACAINE LIPOSOME 1.3 % IJ SUSP
20.0000 mL | Freq: Once | INTRAMUSCULAR | Status: DC
  Filled 2012-05-29: qty 20

## 2012-05-29 MED ORDER — 0.9 % SODIUM CHLORIDE (POUR BTL) OPTIME
TOPICAL | Status: DC | PRN
  Administered 2012-05-29: 1000 mL

## 2012-05-29 MED ORDER — PHENOL 1.4 % MT LIQD
1.0000 | OROMUCOSAL | Status: DC | PRN
  Filled 2012-05-29: qty 177

## 2012-05-29 MED ORDER — DIPHENHYDRAMINE HCL 50 MG/ML IJ SOLN: 12.5000 mg | Freq: Four times a day (QID) | INTRAMUSCULAR | Status: DC | PRN

## 2012-05-29 MED ORDER — METOCLOPRAMIDE HCL 5 MG/ML IJ SOLN: 5.0000 mg | Freq: Three times a day (TID) | INTRAMUSCULAR | Status: DC | PRN

## 2012-05-29 MED ORDER — ALUM & MAG HYDROXIDE-SIMETH 200-200-20 MG/5ML PO SUSP: 30.0000 mL | ORAL | Status: DC | PRN

## 2012-05-29 MED ORDER — CARISOPRODOL 350 MG PO TABS
350.0000 mg | ORAL_TABLET | Freq: Three times a day (TID) | ORAL | Status: DC
  Administered 2012-05-29 – 2012-06-01 (×8): 350 mg via ORAL
  Filled 2012-05-29 (×8): qty 1

## 2012-05-29 MED ORDER — MIDAZOLAM HCL 2 MG/2ML IJ SOLN
1.0000 mg | INTRAMUSCULAR | Status: DC | PRN
  Administered 2012-05-29: 2 mg via INTRAVENOUS

## 2012-05-29 MED ORDER — DIPHENHYDRAMINE HCL 12.5 MG/5ML PO ELIX: 12.5000 mg | ORAL_SOLUTION | ORAL | Status: DC | PRN

## 2012-05-29 MED ORDER — METOCLOPRAMIDE HCL 10 MG PO TABS: 5.0000 mg | ORAL_TABLET | Freq: Three times a day (TID) | ORAL | Status: DC | PRN

## 2012-05-29 MED ORDER — SODIUM CHLORIDE 0.9 % IJ SOLN
INTRAMUSCULAR | Status: DC | PRN
  Administered 2012-05-29: 20 mL

## 2012-05-29 SURGICAL SUPPLY — 65 items
BAG ZIPLOCK 12X15 (MISCELLANEOUS) ×2 IMPLANT
BANDAGE ELASTIC 6 VELCRO ST LF (GAUZE/BANDAGES/DRESSINGS) ×2 IMPLANT
BANDAGE ESMARK 6X9 LF (GAUZE/BANDAGES/DRESSINGS) ×1 IMPLANT
BLADE SAG 18X100X1.27 (BLADE) ×2 IMPLANT
BLADE SAW SGTL 13.0X1.19X90.0M (BLADE) ×2 IMPLANT
BLADE SURG SZ11 CARB STEEL (BLADE) ×2 IMPLANT
BNDG ESMARK 6X9 LF (GAUZE/BANDAGES/DRESSINGS) ×2
BOWL SMART MIX CTS (DISPOSABLE) ×2 IMPLANT
CEMENT HV SMART SET (Cement) ×2 IMPLANT
CLOTH BEACON ORANGE TIMEOUT ST (SAFETY) ×2 IMPLANT
CUFF TOURN SGL QUICK 34 (TOURNIQUET CUFF) ×1
CUFF TRNQT CYL 34X4X40X1 (TOURNIQUET CUFF) ×1 IMPLANT
DRAPE EXTREMITY T 121X128X90 (DRAPE) ×2 IMPLANT
DRAPE LG THREE QUARTER DISP (DRAPES) ×2 IMPLANT
DRAPE POUCH INSTRU U-SHP 10X18 (DRAPES) ×2 IMPLANT
DRAPE U-SHAPE 47X51 STRL (DRAPES) ×2 IMPLANT
DRSG PAD ABDOMINAL 8X10 ST (GAUZE/BANDAGES/DRESSINGS) ×2 IMPLANT
DURAPREP 26ML APPLICATOR (WOUND CARE) ×2 IMPLANT
ELECT REM PT RETURN 9FT ADLT (ELECTROSURGICAL) ×2
ELECTRODE REM PT RTRN 9FT ADLT (ELECTROSURGICAL) ×1 IMPLANT
EVACUATOR 1/8 PVC DRAIN (DRAIN) ×2 IMPLANT
FACESHIELD LNG OPTICON STERILE (SAFETY) ×10 IMPLANT
GAUZE XEROFORM 5X9 LF (GAUZE/BANDAGES/DRESSINGS) IMPLANT
GLOVE BIO SURGEON STRL SZ7.5 (GLOVE) ×2 IMPLANT
GLOVE BIOGEL PI IND STRL 7.5 (GLOVE) IMPLANT
GLOVE BIOGEL PI IND STRL 8 (GLOVE) ×1 IMPLANT
GLOVE BIOGEL PI INDICATOR 7.5 (GLOVE)
GLOVE BIOGEL PI INDICATOR 8 (GLOVE) ×1
GLOVE ECLIPSE 7.0 STRL STRAW (GLOVE) IMPLANT
GLOVE SURG SS PI 6.5 STRL IVOR (GLOVE) ×6 IMPLANT
GLOVE SURG SS PI 7.5 STRL IVOR (GLOVE) ×4 IMPLANT
GOWN STRL NON-REIN LRG LVL3 (GOWN DISPOSABLE) ×2 IMPLANT
GOWN STRL REIN XL XLG (GOWN DISPOSABLE) ×6 IMPLANT
HANDPIECE INTERPULSE COAX TIP (DISPOSABLE) ×1
IMMOBILIZER KNEE 20 (SOFTGOODS) ×4
IMMOBILIZER KNEE 20 THIGH 36 (SOFTGOODS) ×2 IMPLANT
KIT BASIN OR (CUSTOM PROCEDURE TRAY) ×2 IMPLANT
NDL SAFETY ECLIPSE 18X1.5 (NEEDLE) ×1 IMPLANT
NEEDLE HYPO 18GX1.5 SHARP (NEEDLE) ×1
NS IRRIG 1000ML POUR BTL (IV SOLUTION) ×2 IMPLANT
PACK TOTAL JOINT (CUSTOM PROCEDURE TRAY) ×2 IMPLANT
PADDING CAST COTTON 6X4 STRL (CAST SUPPLIES) ×2 IMPLANT
PIN SCHANZ 4MM 130MM (PIN) IMPLANT
POSITIONER SURGICAL ARM (MISCELLANEOUS) ×2 IMPLANT
SET HNDPC FAN SPRY TIP SCT (DISPOSABLE) ×1 IMPLANT
SET PAD KNEE POSITIONER (MISCELLANEOUS) ×2 IMPLANT
SPONGE GAUZE 4X4 12PLY (GAUZE/BANDAGES/DRESSINGS) ×2 IMPLANT
SPONGE LAP 18X18 X RAY DECT (DISPOSABLE) IMPLANT
STAPLER VISISTAT 35W (STAPLE) ×2 IMPLANT
STRIP CLOSURE SKIN 1/2X4 (GAUZE/BANDAGES/DRESSINGS) ×4 IMPLANT
SUCTION FRAZIER 12FR DISP (SUCTIONS) ×2 IMPLANT
SUT ETHILON 3 0 PS 1 (SUTURE) IMPLANT
SUT VIC AB 0 CT1 27 (SUTURE)
SUT VIC AB 0 CT1 27XBRD ANTBC (SUTURE) IMPLANT
SUT VIC AB 1 CT1 27 (SUTURE) ×1
SUT VIC AB 1 CT1 27XBRD ANTBC (SUTURE) ×1 IMPLANT
SUT VIC AB 2-0 CT1 27 (SUTURE) ×2
SUT VIC AB 2-0 CT1 TAPERPNT 27 (SUTURE) ×2 IMPLANT
SUT VIC AB 4-0 PS2 27 (SUTURE) ×2 IMPLANT
SUT VLOC 180 0 24IN GS25 (SUTURE) ×2 IMPLANT
TOWEL OR 17X26 10 PK STRL BLUE (TOWEL DISPOSABLE) ×4 IMPLANT
TOWEL OR NON WOVEN STRL DISP B (DISPOSABLE) ×2 IMPLANT
TRAY FOLEY CATH 14FRSI W/METER (CATHETERS) ×2 IMPLANT
WATER STERILE IRR 1500ML POUR (IV SOLUTION) ×4 IMPLANT
WRAP KNEE MAXI GEL POST OP (GAUZE/BANDAGES/DRESSINGS) ×2 IMPLANT

## 2012-05-29 NOTE — Brief Op Note (Signed)
05/29/2012  2:46 PM  PATIENT:  [name]  56 y.o. female  PRE-OPERATIVE DIAGNOSIS:  Right knee osteoarthritis  POST-OPERATIVE DIAGNOSIS:  Right knee osteoarthritis  PROCEDURE:  Procedure(s) (LRB) with comments: TOTAL KNEE ARTHROPLASTY (Right) - Right Total Knee Arthroplasty  SURGEON:  Surgeon(s) and Role:    * Mcarthur Rossetti, MD - Primary  PHYSICIAN ASSISTANT:   ASSISTANTS: Phillips Hay, PA-C   ANESTHESIA:   regional and general  EBL:  Total I/O In: -  Out: 100 [Urine:100]  BLOOD ADMINISTERED:none  DRAINS: none   COUNTS:  YES  TOURNIQUET:   Total Tourniquet Time Documented: Thigh (Right) - 82 minutes  DICTATION: .Other Dictation: Dictation Number [id number]  PLAN OF CARE: Admit to inpatient   PATIENT DISPOSITION:  PACU - hemodynamically stable.   Delay start of Pharmacological VTE agent (>24hrs) due to surgical blood loss or risk of bleeding: no

## 2012-05-29 NOTE — Addendum Note (Signed)
Addendum  created 05/29/12 1615 by Sherry Ruffing, CRNA   Modules edited:Anesthesia Events, Anesthesia Flowsheet, Anesthesia Medication Administration

## 2012-05-29 NOTE — Anesthesia Preprocedure Evaluation (Addendum)
Anesthesia Evaluation  Patient identified by MRN, date of birth, ID band Patient awake    Reviewed: Allergy & Precautions, H&P , NPO status , Patient's Chart, lab work & pertinent test results  Airway Mallampati: II TM Distance: <3 FB Neck ROM: Full    Dental No notable dental hx.    Pulmonary Current Smoker,  breath sounds clear to auscultation  Pulmonary exam normal       Cardiovascular hypertension, Pt. on medications Rhythm:Regular Rate:Normal     Neuro/Psych negative neurological ROS  negative psych ROS   GI/Hepatic Neg liver ROS, GERD-  ,  Endo/Other  Hypothyroidism   Renal/GU negative Renal ROS  negative genitourinary   Musculoskeletal negative musculoskeletal ROS (+)   Abdominal   Peds negative pediatric ROS (+)  Hematology negative hematology ROS (+)   Anesthesia Other Findings   Reproductive/Obstetrics negative OB ROS                           Anesthesia Physical Anesthesia Plan  ASA: III  Anesthesia Plan: General   Post-op Pain Management:    Induction: Intravenous  Airway Management Planned: LMA  Additional Equipment:   Intra-op Plan:   Post-operative Plan:   Informed Consent: I have reviewed the patients History and Physical, chart, labs and discussed the procedure including the risks, benefits and alternatives for the proposed anesthesia with the patient or authorized representative who has indicated his/her understanding and acceptance.   Dental advisory given  Plan Discussed with: CRNA and Surgeon  Anesthesia Plan Comments:        Anesthesia Quick Evaluation

## 2012-05-29 NOTE — Anesthesia Postprocedure Evaluation (Signed)
  Anesthesia Post-op Note  Patient: Jill Phillips  Procedure(s) Performed: Procedure(s) (LRB): TOTAL KNEE ARTHROPLASTY (Right)  Patient Location: PACU  Anesthesia Type: GA combined with regional for post-op pain  Level of Consciousness: awake and alert   Airway and Oxygen Therapy: Patient Spontanous Breathing  Post-op Pain: mild  Post-op Assessment: Post-op Vital signs reviewed, Patient's Cardiovascular Status Stable, Respiratory Function Stable, Patent Airway and No signs of Nausea or vomiting  Post-op Vital Signs: stable  Complications: No apparent anesthesia complications

## 2012-05-29 NOTE — Transfer of Care (Signed)
Immediate Anesthesia Transfer of Care Note  Patient: Jill Phillips  Procedure(s) Performed: Procedure(s) (LRB) with comments: TOTAL KNEE ARTHROPLASTY (Right) - Right Total Knee Arthroplasty  Patient Location: PACU  Anesthesia Type: General and Regional  Level of Consciousness: awake, sedated and patient cooperative  Airway & Oxygen Therapy: Patient Spontanous Breathing and Patient connected to face mask oxygen  Post-op Assessment: Report given to PACU RN  Post vital signs: Reviewed and stable  Complications: No apparent anesthesia complications

## 2012-05-29 NOTE — Anesthesia Procedure Notes (Signed)
Anesthesia Regional Block:  Femoral nerve block  Pre-Anesthetic Checklist: ,, timeout performed, Correct Patient, Correct Site, Correct Laterality, Correct Procedure, Correct Position, site marked, Risks and benefits discussed,  Surgical consent,  Pre-op evaluation,  At surgeon's request and post-op pain management   Prep: chloraprep       Needles:  Injection technique: Single-shot  Needle Type: Echogenic Needle     Needle Length:cm 9 cm Needle Gauge: 21 G    Additional Needles:  Procedures: ultrasound guided Femoral nerve block Narrative:  Start time: 05/29/2012 12:00 PM End time: 05/29/2012 12:05 PM Injection made incrementally with aspirations every 5 mL.  Performed by: Personally  Anesthesiologist: Myrtie Soman MD  Additional Notes: Patient tolerated the procedure well without complications  Femoral nerve block

## 2012-05-29 NOTE — H&P (Signed)
Jill Phillips is an 56 y.o. female.   Chief Complaint:   Severe right knee pain HPI:   56 yo female with end-stage OA of her right knee.  She has tried NSAIDs, multiple injections, etc.  She wishes now to proceed with a total knee replacement given the failure of conservative treatment.  The goals are decreased pain and improved mobility.  The risks are blood loss, nerve and vessel injury, fracture, infection and DVT.  Past Medical History  Diagnosis Date  . Postlaminectomy syndrome, thoracic region   . Osteoarthrosis, unspecified whether generalized or localized, lower leg   . Dysthymic disorder   . Calcifying tendinitis of shoulder   . Pain in joint, upper arm   . Chronic pain syndrome   . Lumbago   . Primary localized osteoarthrosis, lower leg   . Hypertension   . Hyperlipidemia   . GERD (gastroesophageal reflux disease)   . Thyroid disease   . Restless leg syndrome   . PONV (postoperative nausea and vomiting)   . Heart murmur   . Sleep apnea     s/p surgery- last sleep study 2011- doesnt use oxygen or machine at night as instructed  . History of blood transfusion 1980  . Hypothyroidism     Past Surgical History  Procedure Date  . Appendectomy   . Abdominal hysterectomy   . Tubal ligation   . Spine surgery     thoracic x 1,  lumbar x 15  . Right hip replacement   . Knee surgeries r knee     arthroscopy- right  . Hammer toe surgery   . Sleep apnea surgery   . Cholecystectomy   . Back surgery   . Joint replacement     Family History  Problem Relation Age of Onset  . Kidney disease Mother   . Heart disease Father   . Anuerysm Brother 29    brain  . Heart disease Brother    Social History:  reports that she has been smoking Cigarettes.  She has a 67.5 pack-year smoking history. She has never used smokeless tobacco. She reports that she does not drink alcohol or use illicit drugs.  Allergies:  Allergies  Allergen Reactions  . Amoxicillin     Oral yeast  infection  . Chlorzoxazone     headache  . Codeine     headache  . Darvocet (Propoxyphene-Acetaminophen) Itching  . Dilaudid (Hydromorphone Hcl) Itching  . Morphine And Related Itching  . Nitrofurantoin Monohyd Macro Hives  . Oxycodone-Acetaminophen Itching  . Percocet (Oxycodone-Acetaminophen) Itching  . Sulfa Antibiotics     Yeast infection in mouth    Medications Prior to Admission  Medication Sig Dispense Refill  . acetaminophen (TYLENOL) 500 MG tablet Take 1,000 mg by mouth every 6 (six) hours as needed. For pain      . amLODipine (NORVASC) 5 MG tablet Take 5 mg by mouth at bedtime.       . benazepril (LOTENSIN) 20 MG tablet Take 20 mg by mouth at bedtime.       . carisoprodol (SOMA) 350 MG tablet Take 350 mg by mouth every 8 (eight) hours as needed.      . clonazePAM (KLONOPIN) 1 MG tablet Take 2 mg by mouth at bedtime as needed. For restless leg      . cloNIDine (CATAPRES) 0.1 MG tablet Take 0.1 mg by mouth 3 (three) times daily.      . diclofenac sodium (VOLTAREN) 1 % GEL Apply 2  g topically 3 (three) times daily as needed. For pain. Apply to arms, shoulders, and knees.      . hydrochlorothiazide (HYDRODIURIL) 12.5 MG tablet Take 12.5 mg by mouth at bedtime.       [name] levothyroxine (SYNTHROID, LEVOTHROID) 25 MCG tablet Take 25 mcg by mouth at bedtime.       [name] omeprazole (PRILOSEC) 20 MG capsule Take 20 mg by mouth at bedtime.       . pentazocine-naloxone (TALWIN NX) 50-0.5 MG per tablet Take 1 tablet by mouth every 8 (eight) hours as needed. For pain      . valACYclovir (VALTREX) 500 MG tablet Take 500 mg by mouth at bedtime.       [name] venlafaxine (EFFEXOR-XR) 150 MG 24 hr capsule Take 300 mg by mouth at bedtime.       . zaleplon (SONATA) 5 MG capsule Take 5-10 mg by mouth at bedtime. Repeat if more than three hours left of sleep      . ALPRAZolam (XANAX) 1 MG tablet Take 1 mg by mouth daily as needed. For nerves      . docusate sodium (COLACE) 100 MG capsule Take 100-200 mg by  mouth at bedtime.      . fentaNYL (DURAGESIC) 75 MCG/HR Place 1 patch (75 mcg total) onto the skin every 3 (three) days.  10 patch  0  . OVER THE COUNTER MEDICATION Take 1 tablet by mouth daily. vegetables laxative      . pramipexole (MIRAPEX) 1 MG tablet Take 1 mg by mouth as needed.      . promethazine (PHENERGAN) 25 MG tablet Take 25 mg by mouth daily as needed. For nausea        No results found for this or any previous visit (from the past 48 hour(s)). No results found.  Review of Systems  All other systems reviewed and are negative.    There were no vitals taken for this visit. Physical Exam  Constitutional: She is oriented to person, place, and time. She appears well-developed and well-nourished.  HENT:  Head: Normocephalic and atraumatic.  Eyes: EOM are normal. Pupils are equal, round, and reactive to light.  Neck: Normal range of motion. Neck supple.  Cardiovascular: Normal rate and regular rhythm.   Respiratory: Effort normal and breath sounds normal.  GI: Soft. Bowel sounds are normal.  Musculoskeletal:       Right knee: She exhibits decreased range of motion and effusion. tenderness found. Medial joint line and lateral joint line tenderness noted.  Neurological: She is alert and oriented to person, place, and time.  Skin: Skin is warm and dry.  Psychiatric: She has a normal mood and affect.     Assessment/Plan Right knee painful OA  1)  To the OR today for a right total knee replacement  BLACKMAN,CHRISTOPHER Y 05/29/2012, 12:20 PM

## 2012-05-30 LAB — BASIC METABOLIC PANEL
BUN: 14 mg/dL (ref 6–23)
CO2: 29 mEq/L (ref 19–32)
Calcium: 8.2 mg/dL — ABNORMAL LOW (ref 8.4–10.5)
Chloride: 100 mEq/L (ref 96–112)
Creatinine, Ser: 1.02 mg/dL (ref 0.50–1.10)
GFR calc Af Amer: 69 mL/min — ABNORMAL LOW (ref >90)
GFR calc non Af Amer: 60 mL/min — ABNORMAL LOW (ref >90)
Glucose, Bld: 98 mg/dL (ref 70–99)
Potassium: 3.3 mEq/L — ABNORMAL LOW (ref 3.5–5.1)
Sodium: 138 mEq/L (ref 135–145)

## 2012-05-30 LAB — CBC
HCT: 32 % — ABNORMAL LOW (ref 36.0–46.0)
Hemoglobin: 10.6 g/dL — ABNORMAL LOW (ref 12.0–15.0)
MCH: 30.5 pg (ref 26.0–34.0)
MCHC: 33.1 g/dL (ref 30.0–36.0)
MCV: 92.2 fL (ref 78.0–100.0)
Platelets: 271 10*3/uL (ref 150–400)
RBC: 3.47 MIL/uL — ABNORMAL LOW (ref 3.87–5.11)
RDW: 13.7 % (ref 11.5–15.5)
WBC: 9.3 10*3/uL (ref 4.0–10.5)

## 2012-05-30 NOTE — Op Note (Signed)
NAMEPRISTINE, SALVETTI NO.:  [id number]  MEDICAL RECORD NO.:  [id number]  LOCATION:  [place]                         FACILITY:  [facility]  PHYSICIAN:  Lind Guest. Ninfa Linden, M.D.DATE OF BIRTH:  1955-11-07  DATE OF PROCEDURE:  05/29/2012 DATE OF DISCHARGE:                              OPERATIVE REPORT   PREOPERATIVE DIAGNOSIS:  End-stage arthritis, degenerative joint disease, right knee.  POSTOPERATIVE DIAGNOSIS:  End-stage arthritis, degenerative joint disease, right knee.  PROCEDURE:  Right total knee arthroplasty.  IMPLANTS:  Stryker triathlon knee with size 2 femur, size 3 tibial tray, 9 mm polyethylene insert, size 29 patella polyethylene patellar button.  SURGEON:  Lind Guest. Ninfa Linden, M.D.  ASSISTANT:  Epimenio Foot, PA-C.  ANESTHESIA: 1. Right leg femoral nerve block. 2. General.  TOURNIQUET TIME:  Less than 2 hours.  ESTIMATED BLOOD LOSS:  Less than 100 mL.  COMPLICATIONS:  None.  INDICATIONS:  Jill Jill is a 56 year old female with end-stage arthritis involving her right knee.  She has tried numerous injections. She is on chronic pain management.  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  The risks and benefits of surgery has been explained to her in detail, and she does wish to proceed.  PROCEDURE DESCRIPTION:  After informed consent was obtained, appropriate right knee was marked.  Anesthesia was obtained with femoral nerve block.  She was brought to the operating room, placed supine on the operative table.  General anesthesia was then obtained.  A nonsterile tourniquet was placed around her upper right thigh and her right leg was prepped and draped with DuraPrep and sterile drapes including sterile stockinette.  A time-out was called.  She was identified as  correct patient and correct right knee.  I then used Esmarch to wrap out the leg and tourniquet was inflated to 300 mm of pressure.  A midline incision was then directed directly over the patella and dissected down to the knee and then performed a medial parapatellar arthrotomy.  I opened up the knee joint and cleaned the knee of remnants of the meniscus, ACL, PCL as well as removing osteophytes throughout the knee.  There was significant deformity of the knee mainly lateral femoral condyle medially.  I then 1st made my tibial cut based on extramedullary guide with 0 slope taking 9 mm off the high side and then once that was done, I felt like I need to take an extra 2 mm.  After I made my tibial cut, I sized for a size 2 femur and made my distal femoral cut based on 5 degrees right, 10 mm were cut.  With the knee in extension after making my initial distal femoral cut and tibial cut, I placed a 9 mm block it gave her absolutely full extension.  I then placed my cutting block back on the femur so I could make my anterior and posterior cuts as well as my chamfer cuts.  I then made a femoral box cut.  I then went back  to the tibia and sized for a size 3 tibia based on the tibial tubercle as a rotation.  I then made my keel punch.  With all trial components in place, I was pleased with the range of motion.  We then cut the patella freehand and drilled holes for size 29 patellar button.  I then removed all trial components, copiously irrigated the knee with normal saline solution with pulsatile lavage.  I then cemented the real Stryker triathlon tibial base plate size 3, followed by the real size 2 femur. I placed the real fix bearing polyethylene insert, which was 9 mm and cemented the size 29 patellar button.  Once the cement had dried, we irrigated the knee again with normal saline solution.  We then let the tourniquet down.  Hemostasis was obtained with electrocautery.  I then placed  Exparel into the soft tissues around the knee.  We closed the arthrotomy with interrupted 0 V-Loc suture with supplemental #1 Vicryl followed by 2-0 Vicryl in the subcutaneous tissue and 4-0 Vicryl subcuticular suture.  Steri-Strips applied.  Xeroform followed by a well- padded sterile dressing was applied.  She was awakened, extubated, and taken to the recovery room in a stable condition.  All final counts were correct.  There were no complications noted.  Of note, Jill Jill, [place]- C was present during the entire case and her assistance was needed in completing the case.     Lind Guest. Ninfa Linden, M.D.     CYB/MEDQ  D:  05/29/2012  T:  05/30/2012  Job:  [id number]

## 2012-05-30 NOTE — Progress Notes (Signed)
Subjective: 1 Day Post-Op Procedure(s) (LRB): TOTAL KNEE ARTHROPLASTY (Right) Patient reports pain as moderate.  Asymptomatic acute blood loss anemia.  Has already been up with therapy.  Objective: Vital signs in last 24 hours: Temp:  [98 F (36.7 C)-101.3 F (38.5 C)] 98.8 F (37.1 C) (09/28 0925) Pulse Rate:  [64-103] 64  (09/28 0925) Resp:  [1-19] 1  (09/28 0925) BP: (83-173)/(54-96) 105/61 mmHg (09/28 0919) SpO2:  [88 %-100 %] 100 % (09/28 0919) Weight:  [66.225 kg (146 lb)] 66.225 kg (146 lb) (09/28 [id number])  Intake/Output from previous day: 09/27 0701 - 09/28 0700 In: 2323.8 [P.O.:600; I.V.:1568.8; IV Piggyback:155] Out: 945 [Urine:905; Blood:40] Intake/Output this shift:     Basename 05/30/12 0459  HGB 10.6*    Basename 05/30/12 0459  WBC 9.3  RBC 3.47*  HCT 32.0*  PLT 271    Basename 05/30/12 0459  NA 138  K 3.3*  CL 100  CO2 29  BUN 14  CREATININE 1.02  GLUCOSE 98  CALCIUM 8.2*   No results found for this basename: LABPT:2,INR:2 in the last 72 hours  Sensation intact distally Intact pulses distally Dorsiflexion/Plantar flexion intact Incision: dressing C/D/I Compartment soft  Assessment/Plan: 1 Day Post-Op Procedure(s) (LRB): TOTAL KNEE ARTHROPLASTY (Right) Up with therapy  Jill Phillips 05/30/2012, 9:26 AM

## 2012-05-30 NOTE — Progress Notes (Signed)
Physical Therapy Treatment Patient Details Name: Jill Phillips MRN: [id number] DOB: 19-Oct-1955 Today's Date: 05/30/2012 Time: [id number] PT Time Calculation (min): 28 min  PT Assessment / Plan / Recommendation Comments on Treatment Session  Pt progressing well with ambulation and exercises.  D/C'd knee immobilizer during session due to pt able to perform SLR with < 10 deg lag.     Follow Up Recommendations  Home health PT    Barriers to Discharge        Equipment Recommendations  3 in 1 bedside comode    Recommendations for Other Services    Frequency 7X/week   Plan Discharge plan remains appropriate    Precautions / Restrictions Precautions Precautions: Knee Restrictions Weight Bearing Restrictions: No RLE Weight Bearing: Weight bearing as tolerated   Pertinent Vitals/Pain 3/10 pain    Mobility  Bed Mobility Bed Mobility: Supine to Sit;Sit to Supine Supine to Sit: 4: Min guard;HOB elevated Sit to Supine: 4: Min guard;HOB flat Details for Bed Mobility Assistance: min/guarding for RLE into and out of bed with min cues for hand placement on bed.  Transfers Transfers: Sit to Stand;Stand to Sit Sit to Stand: 4: Min guard;From elevated surface;With upper extremity assist;From bed Stand to Sit: 4: Min guard;With upper extremity assist;To elevated surface;To bed Details for Transfer Assistance: Guarding for safety with cues for hand placement and LE management.  Ambulation/Gait Ambulation/Gait Assistance: 4: Min guard Ambulation Distance (Feet): 60 Feet Assistive device: Rolling walker Ambulation/Gait Assistance Details: Min cues for sequencing/technique with RW due to pt tendency to step too far inside of RW.  Gait Pattern: Step-to pattern;Decreased stance time - right;Decreased step length - left;Antalgic;Trunk flexed Gait velocity: decreased Stairs: No Wheelchair Mobility Wheelchair Mobility: No    Exercises Total Joint Exercises Ankle Circles/Pumps: AROM;Both;20  reps Quad Sets: AROM;Right;10 reps Short Arc Quad: AROM;Strengthening;Right;10 reps Heel Slides: AAROM;Right;10 reps Hip ABduction/ADduction: AROM;Right;10 reps Straight Leg Raises: AROM;Right;10 reps   PT Diagnosis:    PT Problem List:   PT Treatment Interventions:     PT Goals Acute Rehab PT Goals PT Goal Formulation: With patient Time For Goal Achievement: 06/03/12 Potential to Achieve Goals: Good Pt will go Supine/Side to Sit: with supervision PT Goal: Supine/Side to Sit - Progress: Progressing toward goal Pt will go Sit to Supine/Side: with supervision PT Goal: Sit to Supine/Side - Progress: Progressing toward goal Pt will go Sit to Stand: with supervision PT Goal: Sit to Stand - Progress: Progressing toward goal Pt will Ambulate: 51 - 150 feet;with supervision;with least restrictive assistive device PT Goal: Ambulate - Progress: Progressing toward goal Pt will Perform Home Exercise Program: with supervision, verbal cues required/provided PT Goal: Perform Home Exercise Program - Progress: Progressing toward goal  Visit Information  Last PT Received On: 05/30/12 Assistance Needed: +1    Subjective Data  Subjective: I think I'm moving better this afternoon.  Patient Stated Goal: to get on with my life   Cognition  Overall Cognitive Status: Appears within functional limits for tasks assessed/performed Arousal/Alertness: Awake/alert Orientation Level: Appears intact for tasks assessed Behavior During Session: [facility] for tasks performed    Balance     End of Session PT - End of Session Equipment Utilized During Treatment: Other (comment) (D/c'd KI, able to perform SLR w/ < 10 deg lag) Activity Tolerance: Patient tolerated treatment well Patient left: in chair;with call bell/phone within reach Nurse Communication: Mobility status   GP     Page, Betha Loa 05/30/2012, 4:14 PM

## 2012-05-30 NOTE — Evaluation (Signed)
Physical Therapy Evaluation Patient Details Name: Jill Phillips MRN: [id number] DOB: February 23, 1956 Today's Date: 05/30/2012 Time: [id number] PT Time Calculation (min): 28 min  PT Assessment / Plan / Recommendation Clinical Impression  Pt presents s/p R TKA POD 1 with decreased strength, ROM and mobility.  Tolerated OOB and ambulation in hallway, however with increased c/o pain throughout.  Pt will benefit from skilled PT in acute venue to address deficits.  PT recommends HHPT for follow up at D/C to return pt to prior level of functioning.     PT Assessment  Patient needs continued PT services    Follow Up Recommendations  Home health PT    Barriers to Discharge None      Equipment Recommendations  3 in 1 bedside comode (Simultaneous filing. User may not have seen previous data.)    Recommendations for Other Services     Frequency 7X/week    Precautions / Restrictions Precautions Precautions: Knee Restrictions Weight Bearing Restrictions: No RLE Weight Bearing: Weight bearing as tolerated   Pertinent Vitals/Pain 5/10      Mobility  Bed Mobility Bed Mobility: Supine to Sit Supine to Sit: 4: Min assist;With rails;HOB elevated Sitting - Scoot to Edge of Bed: 4: Min guard Details for Bed Mobility Assistance: Assist needed for RLE, cues for hand placement and sequencing Transfers Transfers: Sit to Stand;Stand to Sit Sit to Stand: 4: Min assist;From elevated surface;From bed;With upper extremity assist Stand to Sit: 4: Min assist;To chair/3-in-1;With armrests;With upper extremity assist Details for Transfer Assistance: Min A to control descent, cues for hand placement and RLE management. Ambulation/Gait Ambulation/Gait Assistance: 4: Min assist Ambulation Distance (Feet): 40 Feet Assistive device: Rolling walker Ambulation/Gait Assistance Details: Cues for sequencing/technique with RW, safety and to maintain upright posture.   Gait Pattern: Step-to pattern;Decreased stance  time - right;Decreased step length - left;Antalgic;Trunk flexed Gait velocity: decreased Stairs: No Wheelchair Mobility Wheelchair Mobility: No    Shoulder Instructions     Exercises     PT Diagnosis: Difficulty walking;Generalized weakness;Acute pain  PT Problem List: Decreased strength;Decreased range of motion;Decreased activity tolerance;Decreased balance;Decreased mobility;Decreased knowledge of use of DME;Pain PT Treatment Interventions: DME instruction;Gait training;Functional mobility training;Therapeutic activities;Therapeutic exercise;Balance training;Patient/family education   PT Goals Acute Rehab PT Goals PT Goal Formulation: With patient Time For Goal Achievement: 06/03/12 Potential to Achieve Goals: Good Pt will go Supine/Side to Sit: with supervision PT Goal: Supine/Side to Sit - Progress: Goal set today Pt will go Sit to Supine/Side: with supervision PT Goal: Sit to Supine/Side - Progress: Goal set today Pt will go Sit to Stand: with supervision PT Goal: Sit to Stand - Progress: Goal set today Pt will Ambulate: 51 - 150 feet;with supervision;with least restrictive assistive device PT Goal: Ambulate - Progress: Goal set today Pt will Perform Home Exercise Program: with supervision, verbal cues required/provided PT Goal: Perform Home Exercise Program - Progress: Goal set today  Visit Information  Last PT Received On: 05/30/12 Assistance Needed: +1    Subjective Data  Subjective: I'm so tangled up, I don't know whats on my knee Patient Stated Goal: to get on with my life   Prior Functioning  Home Living Lives With: Daughter Available Help at Discharge: Family;Available 24 hours/day Type of Home: Apartment Home Access: Level entry Home Layout: One level Bathroom Shower/Tub: Chiropodist: Standard Bathroom Accessibility: Yes Home Adaptive Equipment: Walker - rolling;Crutches;Grab bars in shower;Grab bars around toilet Prior  Function Level of Independence: Independent Able to Take Stairs?: No Driving:  No Communication Communication: No difficulties Dominant Hand: Right    Cognition  Overall Cognitive Status: Appears within functional limits for tasks assessed/performed Arousal/Alertness: Awake/alert Orientation Level: Appears intact for tasks assessed Behavior During Session: [facility] for tasks performed    Extremity/Trunk Assessment Right Lower Extremity Assessment RLE ROM/Strength/Tone: Deficits RLE ROM/Strength/Tone Deficits: ankle motions WFL, able to intiate SLR, however requires some assist to maintain muscle contraction RLE Sensation: WFL - Light Touch Left Lower Extremity Assessment LLE ROM/Strength/Tone: WFL for tasks assessed LLE Sensation: WFL - Light Touch Trunk Assessment Trunk Assessment: Normal   Balance    End of Session PT - End of Session Equipment Utilized During Treatment: Gait belt;Right knee immobilizer Activity Tolerance: Patient limited by pain Patient left: in chair;with call bell/phone within reach Nurse Communication: Mobility status  GP     Page, Betha Loa 05/30/2012, 10:15 AM

## 2012-05-30 NOTE — Evaluation (Signed)
Occupational Therapy Evaluation Patient Details Name: Jill Phillips MRN: [id number] DOB: 01-09-1956 Today's Date: 05/30/2012 Time: [id number] OT Time Calculation (min): 27 min  OT Assessment / Plan / Recommendation Clinical Impression  Pt presents with RTKR. Skilled OT indicated to maximize independence with BADLs to supervision level in prep for d/c to next venue of care.    OT Assessment  Patient needs continued OT Services    Follow Up Recommendations  Home health OT    Barriers to Discharge      Equipment Recommendations  3 in 1 bedside comode (Simultaneous filing. User may not have seen previous data.)    Recommendations for Other Services    Frequency  Min 2X/week    Precautions / Restrictions Precautions Precautions: Knee Restrictions Weight Bearing Restrictions: No RLE Weight Bearing: Weight bearing as tolerated   Pertinent Vitals/Pain Reported 5/10 R knee pain with activity. Repositioned, PCA encouraged and cold applied.    ADL  Grooming: Simulated;Set up Where Assessed - Grooming: Unsupported sitting Upper Body Bathing: Simulated;Set up Where Assessed - Upper Body Bathing: Unsupported sitting Lower Body Bathing: Simulated;Minimal assistance Where Assessed - Lower Body Bathing: Supported sit to stand Upper Body Dressing: Simulated;Set up Where Assessed - Upper Body Dressing: Unsupported sitting Lower Body Dressing: Simulated;Moderate assistance Where Assessed - Lower Body Dressing: Supported sit to stand Toilet Transfer: Pharmacologist Method: Sit to Loss adjuster, chartered: Other (comment) Building services engineer) Toileting - Clothing Manipulation and Hygiene: Simulated;Minimal assistance Where Assessed - Camera operator Manipulation and Hygiene: Standing Equipment Used: Knee Immobilizer;Rolling walker;Gait belt Transfers/Ambulation Related to ADLs: Pt ambulated with min A and VCs for step sequence.    OT Diagnosis: Generalized  weakness  OT Problem List: Decreased activity tolerance;Decreased safety awareness;Decreased knowledge of use of DME or AE;Pain OT Treatment Interventions: Self-care/ADL training;DME and/or AE instruction;Patient/family education;Therapeutic activities   OT Goals Acute Rehab OT Goals OT Goal Formulation: With patient Time For Goal Achievement: 06/06/12 Potential to Achieve Goals: Good ADL Goals Pt Will Perform Grooming: with supervision;Standing at sink ADL Goal: Grooming - Progress: Goal set today Pt Will Perform Lower Body Bathing: with supervision;Sit to stand from chair;Sit to stand from bed ADL Goal: Lower Body Bathing - Progress: Goal set today Pt Will Perform Lower Body Dressing: with supervision;Sit to stand from chair;Sit to stand from bed ADL Goal: Lower Body Dressing - Progress: Goal set today Pt Will Transfer to Toilet: with supervision;Ambulation;3-in-1;Raised toilet seat with arms ADL Goal: Toilet Transfer - Progress: Goal set today Pt Will Perform Toileting - Clothing Manipulation: with supervision;Standing;Sitting on 3-in-1 or toilet ADL Goal: Toileting - Clothing Manipulation - Progress: Goal set today Pt Will Perform Toileting - Hygiene: with supervision;Sitting on 3-in-1 or toilet;Sit to stand from 3-in-1/toilet ADL Goal: Toileting - Hygiene - Progress: Goal set today Pt Will Perform Tub/Shower Transfer: Ambulation;Transfer tub bench;with min assist ADL Goal: Tub/Shower Transfer - Progress: Goal set today  Visit Information  Last OT Received On: 05/30/12 Assistance Needed: +1 PT/OT Co-Evaluation/Treatment: Yes    Subjective Data  Subjective: I don't know whats on my knee. Patient Stated Goal: I just want to get on with my life.   Prior Functioning     Home Living Lives With: Daughter Available Help at Discharge: Family;Available 24 hours/day Type of Home: Apartment Home Access: Level entry Home Layout: One level Bathroom Shower/Tub: Scientist, forensic: Standard Bathroom Accessibility: Yes Home Adaptive Equipment: Walker - rolling;Crutches;Grab bars in shower;Grab bars around toilet Prior Function Level of Independence: Independent  Able to Take Stairs?: No Driving: No Communication Communication: No difficulties Dominant Hand: Right         Vision/Perception     Cognition  Overall Cognitive Status: Appears within functional limits for tasks assessed/performed Arousal/Alertness: Awake/alert Orientation Level: Appears intact for tasks assessed Behavior During Session: [facility] for tasks performed    Extremity/Trunk Assessment Right Lower Extremity Assessment RLE ROM/Strength/Tone: Deficits RLE ROM/Strength/Tone Deficits: ankle motions WFL, able to intiate SLR, however requires some assist to maintain muscle contraction RLE Sensation: WFL - Light Touch Left Lower Extremity Assessment LLE ROM/Strength/Tone: WFL for tasks assessed LLE Sensation: WFL - Light Touch Trunk Assessment Trunk Assessment: Normal     Mobility Bed Mobility Bed Mobility: Supine to Sit Supine to Sit: 4: Min assist;With rails;HOB elevated Sitting - Scoot to Edge of Bed: 4: Min guard Details for Bed Mobility Assistance: Assist needed for RLE, cues for hand placement and sequencing Transfers Transfers: Sit to Stand;Stand to Sit Sit to Stand: 4: Min assist;From elevated surface;From bed;With upper extremity assist Stand to Sit: 4: Min assist;To chair/3-in-1;With armrests;With upper extremity assist Details for Transfer Assistance: Min A to control descent, cues for hand placement and RLE management.     Shoulder Instructions     Exercise     Balance     End of Session OT - End of Session Equipment Utilized During Treatment: Gait belt;Right knee immobilizer Activity Tolerance: Patient tolerated treatment well Patient left: in chair;with call bell/phone within reach  [place] A OTR/L [phone] 05/30/2012, 10:14  AM

## 2012-05-31 LAB — CBC
HCT: 26.5 % — ABNORMAL LOW (ref 36.0–46.0)
Hemoglobin: 8.9 g/dL — ABNORMAL LOW (ref 12.0–15.0)
MCH: 30.4 pg (ref 26.0–34.0)
MCHC: 33.2 g/dL (ref 30.0–36.0)
MCV: 91.7 fL (ref 78.0–100.0)
Platelets: 206 10*3/uL (ref 150–400)
RBC: 2.89 MIL/uL — ABNORMAL LOW (ref 3.87–5.11)
RDW: 13.7 % (ref 11.5–15.5)
WBC: 8.8 10*3/uL (ref 4.0–10.5)

## 2012-05-31 NOTE — Progress Notes (Signed)
Subjective: 2 Days Post-Op Procedure(s) (LRB): TOTAL KNEE ARTHROPLASTY (Right) Patient reports pain as severe.  Asymptomatic acute blood loss anemia.  Vitals stable.  Does not appear to be uncomfortable.  Is only high dose chronic pain meds at home.  Objective: Vital signs in last 24 hours: Temp:  [98.2 F (36.8 C)-101 F (38.3 C)] 98.6 F (37 C) (09/29 0501) Pulse Rate:  [64-101] 94  (09/29 0501) Resp:  [1-20] 16  (09/29 0737) BP: (99-131)/(61-81) 131/81 mmHg (09/29 0501) SpO2:  [96 %-100 %] 97 % (09/29 0737)  Intake/Output from previous day: 09/28 0701 - 09/29 0700 In: 1559.3 [P.O.:960; I.V.:599.3] Out: 1150 [Urine:1150] Intake/Output this shift:     Basename 05/31/12 0430 05/30/12 0459  HGB 8.9* 10.6*    Basename 05/31/12 0430 05/30/12 0459  WBC 8.8 9.3  RBC 2.89* 3.47*  HCT 26.5* 32.0*  PLT 206 271    Basename 05/30/12 0459  NA 138  K 3.3*  CL 100  CO2 29  BUN 14  CREATININE 1.02  GLUCOSE 98  CALCIUM 8.2*   No results found for this basename: LABPT:2,INR:2 in the last 72 hours  Intact pulses distally Dorsiflexion/Plantar flexion intact Incision: scant drainage No cellulitis present Compartment soft  Assessment/Plan: 2 Days Post-Op Procedure(s) (LRB): TOTAL KNEE ARTHROPLASTY (Right) Up with therapy Continue PCA 1 more day.  Summit Arroyave Y 05/31/2012, 8:36 AM

## 2012-05-31 NOTE — Progress Notes (Signed)
Occupational Therapy Treatment Patient Details Name: Jill Phillips MRN: [id number] DOB: 1956-02-17 Today's Date: 05/31/2012 Time: [id number] OT Time Calculation (min): 25 min  OT Assessment / Plan / Recommendation Comments on Treatment Session Pt is limited by ongoing R LE pain and also states L arm pain. She is moving slowly today and only able to tolerate toilet transfer and back to bed with OT.    Follow Up Recommendations  Home health OT;Supervision/Assistance - 24 hour    Barriers to Discharge       Equipment Recommendations  3 in 1 bedside comode;Tub/shower bench;Other (comment) (tub bench if covered by insurance. )    Recommendations for Other Services    Frequency Min 2X/week   Plan Discharge plan remains appropriate    Precautions / Restrictions Precautions Precautions: Knee Restrictions Weight Bearing Restrictions: No RLE Weight Bearing: Weight bearing as tolerated        ADL  Toilet Transfer: Performed;Minimal assistance Toilet Transfer Equipment: Raised toilet seat with arms (or 3-in-1 over toilet) Toileting - Clothing Manipulation and Hygiene: Simulated;Minimal assistance Where Assessed - Toileting Clothing Manipulation and Hygiene: Sit to stand from 3-in-1 or toilet ADL Comments: Pt up and part of the way to bathroom with nursing tech when OT entered the room. Nursing tech reports pt in alot of pain and moving slowly. Pt transferred on and off the commode and then requested back to bed. Nursing came in and aware of pt's pain level and also that pt complaining of L arm pain that she states started 4-5 months ago. She states this arm pain comes and goes. She states her entire R leg is hurting and is having trouble getting pain relief. Repositioned pt in bed and placed pillow under heel. Encouraged pt to work on leg presses into the bed. Pt moving very slowly and much effort for all tasks. Discussed tub bench and she states she had one but got rid of it. She would like  another if covered.     OT Diagnosis:    OT Problem List:   OT Treatment Interventions:     OT Goals ADL Goals ADL Goal: Toilet Transfer - Progress: Not progressing (due to pain) ADL Goal: Toileting - Clothing Manipulation - Progress: Not progressing  Visit Information  Last OT Received On: 05/31/12 Assistance Needed: +1    Subjective Data  Subjective: I dont know why my leg is hurting so bad Patient Stated Goal: decrease pain and be able to get back to doing things better   Prior Functioning       Cognition  Overall Cognitive Status: Appears within functional limits for tasks assessed/performed Arousal/Alertness: Awake/alert Orientation Level: Appears intact for tasks assessed Behavior During Session: [facility] for tasks performed    Mobility  Shoulder Instructions Bed Mobility Bed Mobility: Supine to Sit Supine to Sit: 4: Min guard;HOB elevated Sitting - Scoot to Edge of Bed: 5: Supervision Sit to Supine: 4: Min assist;HOB flat Details for Bed Mobility Assistance: assist for R LE onto bed due to pain and fatigue.  Transfers Transfers: Sit to Stand;Stand to Sit Sit to Stand: 4: Min assist;With upper extremity assist;From chair/3-in-1 Stand to Sit: 4: Min assist;With upper extremity assist;To chair/3-in-1;To bed Details for Transfer Assistance: min verbal cues for hand placement and R LE manaqgement. Slight min assist to help bring R LE out in front before sitting.        Exercises      Balance     End of Session OT - End  of Session Activity Tolerance: Patient limited by pain;Patient limited by fatigue Patient left: in bed;with call bell/phone within reach;with nursing in room  GO     Jules Schick [id number] 05/31/2012, 11:20 AM

## 2012-05-31 NOTE — Progress Notes (Signed)
Cm spoke with patient concerning dc planning. Per pt [facility] to provide [facility] services upon discharge. Pt states having RW at home for DME use. Pt states daughter to assist in home care. No other needs specified at this time. [facility] faxed demographics, H/P, progress notes at [phone]. Confirmation received for new referral.   Arlean Hopping [phone]

## 2012-05-31 NOTE — Progress Notes (Signed)
Physical Therapy Treatment Patient Details Name: Jill Phillips MRN: [id number] DOB: [birth year] Today's Date: 05/31/2012 Time: [id number] PT Time Calculation (min): 35 min  PT Assessment / Plan / Recommendation Comments on Treatment Session  Pt with increased pain this morning causing VERY slow gait speed.  RN and MD made aware.     Follow Up Recommendations  Home health PT    Barriers to Discharge        Equipment Recommendations  3 in 1 bedside comode    Recommendations for Other Services    Frequency 7X/week   Plan Discharge plan remains appropriate    Precautions / Restrictions Precautions Precautions: Knee Restrictions Weight Bearing Restrictions: No RLE Weight Bearing: Weight bearing as tolerated   Pertinent Vitals/Pain 8/10, RN notified, ice packs applied.     Mobility  Bed Mobility Bed Mobility: Supine to Sit Supine to Sit: 4: Min guard;HOB elevated Sitting - Scoot to Edge of Bed: 5: Supervision Details for Bed Mobility Assistance: Continue to provide min/guard for safety of RLE out of bed.  Pt demos good UE technique.  Transfers Transfers: Sit to Stand;Stand to Sit Sit to Stand: 4: Min guard;From elevated surface;With upper extremity assist;From bed Stand to Sit: 4: Min guard;With upper extremity assist;With armrests;To chair/3-in-1 Details for Transfer Assistance: Guarding for safety with min cues for hand placement/LE management.  Ambulation/Gait Ambulation/Gait Assistance: 4: Min guard Ambulation Distance (Feet): 45 Feet Assistive device: Rolling walker Ambulation/Gait Assistance Details: Pt with very slow gait speed this morning due to increased pain in knee.  Min cues for not stepping too far inside of RW.   Gait Pattern: Step-to pattern;Decreased stance time - right;Decreased step length - left;Antalgic;Trunk flexed Gait velocity: decreased Stairs: No Wheelchair Mobility Wheelchair Mobility: No    Exercises     PT Diagnosis:    PT Problem List:     PT Treatment Interventions:     PT Goals Acute Rehab PT Goals PT Goal Formulation: With patient Time For Goal Achievement: 06/03/12 Potential to Achieve Goals: Good Pt will go Supine/Side to Sit: with supervision PT Goal: Supine/Side to Sit - Progress: Progressing toward goal Pt will go Sit to Stand: with supervision PT Goal: Sit to Stand - Progress: Progressing toward goal Pt will Ambulate: 51 - 150 feet;with supervision;with least restrictive assistive device PT Goal: Ambulate - Progress: Progressing toward goal  Visit Information  Last PT Received On: 05/31/12 Assistance Needed: +1    Subjective Data  Subjective: I'm hurting really bad today.  Patient Stated Goal: to get on with my life   Cognition  Overall Cognitive Status: Appears within functional limits for tasks assessed/performed Arousal/Alertness: Awake/alert Orientation Level: Appears intact for tasks assessed Behavior During Session: [facility] for tasks performed    Balance     End of Session PT - End of Session Activity Tolerance: Patient limited by pain Patient left: in chair;with call bell/phone within reach Nurse Communication: Mobility status (pts pain level)   GP     Page, Betha Loa 05/31/2012, 9:46 AM

## 2012-05-31 NOTE — Progress Notes (Signed)
Physical Therapy Treatment Patient Details Name: Jill Phillips MRN: [id number] DOB: [birth year] Today's Date: 05/31/2012 Time: [id number] PT Time Calculation (min): 47 min  PT Assessment / Plan / Recommendation Comments on Treatment Session  Pt continues to have increased pain, however somewhat improved from morning session.  Gait speed also continues to be decreased with some slight improvement from am.     Follow Up Recommendations  Home health PT    Barriers to Discharge        Equipment Recommendations  3 in 1 bedside comode;Tub/shower bench;Other (comment)    Recommendations for Other Services    Frequency 7X/week   Plan Discharge plan remains appropriate    Precautions / Restrictions Precautions Precautions: Knee Restrictions Weight Bearing Restrictions: No RLE Weight Bearing: Weight bearing as tolerated   Pertinent Vitals/Pain 5/10, ice applied    Mobility  Bed Mobility Bed Mobility: Supine to Sit Supine to Sit: 4: Min guard;HOB elevated Details for Bed Mobility Assistance: Some assist for RLE out of bed.  Does well with UE placement.  Transfers Transfers: Sit to Stand;Stand to Sit Sit to Stand: 4: Min guard;From elevated surface;With upper extremity assist;From bed Stand to Sit: 4: Min guard;With upper extremity assist;With armrests;To chair/3-in-1 Details for Transfer Assistance: Cues for hand placement and LE management.  Ambulation/Gait Ambulation/Gait Assistance: 4: Min guard Ambulation Distance (Feet): 70 Feet Assistive device: Rolling walker Ambulation/Gait Assistance Details: Min cues for upright and relaxed posture.   Gait Pattern: Step-to pattern;Decreased stance time - right;Decreased step length - left;Antalgic;Trunk flexed Gait velocity: decreased    Exercises Total Joint Exercises Ankle Circles/Pumps: AROM;Both;20 reps Quad Sets: AROM;Right;10 reps Heel Slides: AAROM;Right;10 reps Hip ABduction/ADduction: AROM;Right;10 reps Straight Leg Raises:  AROM;Right;10 reps   PT Diagnosis:    PT Problem List:   PT Treatment Interventions:     PT Goals Acute Rehab PT Goals PT Goal Formulation: With patient Time For Goal Achievement: 06/03/12 Potential to Achieve Goals: Good Pt will go Supine/Side to Sit: with supervision PT Goal: Supine/Side to Sit - Progress: Progressing toward goal Pt will go Sit to Stand: with supervision PT Goal: Sit to Stand - Progress: Progressing toward goal Pt will Ambulate: 51 - 150 feet;with supervision;with least restrictive assistive device PT Goal: Ambulate - Progress: Progressing toward goal Pt will Perform Home Exercise Program: with supervision, verbal cues required/provided PT Goal: Perform Home Exercise Program - Progress: Progressing toward goal  Visit Information  Last PT Received On: 05/31/12 Assistance Needed: +1    Subjective Data  Subjective: I'm doing a little better than I did this morning.  Patient Stated Goal: to get on with my life   Cognition  Overall Cognitive Status: Appears within functional limits for tasks assessed/performed Arousal/Alertness: Lethargic Orientation Level: Appears intact for tasks assessed Behavior During Session: Lethargic    Balance     End of Session PT - End of Session Activity Tolerance: Patient limited by pain Patient left: in chair;with call bell/phone within reach Nurse Communication: Mobility status   GP     Page, Betha Loa 05/31/2012, 4:52 PM

## 2012-06-01 ENCOUNTER — Encounter (HOSPITAL_COMMUNITY): Payer: Self-pay | Admitting: Orthopaedic Surgery

## 2012-06-01 LAB — CBC
HCT: 21.7 % — ABNORMAL LOW (ref 36.0–46.0)
Hemoglobin: 7.4 g/dL — ABNORMAL LOW (ref 12.0–15.0)
MCH: 31.5 pg (ref 26.0–34.0)
MCHC: 34.1 g/dL (ref 30.0–36.0)
MCV: 92.3 fL (ref 78.0–100.0)
Platelets: 169 10*3/uL (ref 150–400)
RBC: 2.35 MIL/uL — ABNORMAL LOW (ref 3.87–5.11)
RDW: 13.8 % (ref 11.5–15.5)
WBC: 6.2 10*3/uL (ref 4.0–10.5)

## 2012-06-01 LAB — PREPARE RBC (CROSSMATCH)

## 2012-06-01 MED ORDER — HYDROMORPHONE HCL 2 MG PO TABS: 2.0000 mg | ORAL_TABLET | ORAL | Status: DC | PRN

## 2012-06-01 MED ORDER — ASPIRIN 325 MG PO TBEC: 325.0000 mg | DELAYED_RELEASE_TABLET | Freq: Two times a day (BID) | ORAL | Status: DC

## 2012-06-01 MED ORDER — PENTAZOCINE-NALOXONE 50-0.5 MG PO TABS: 1.0000 | ORAL_TABLET | Freq: Three times a day (TID) | ORAL | Status: DC | PRN

## 2012-06-01 MED ORDER — FERROUS SULFATE 325 (65 FE) MG PO TABS: 325.0000 mg | ORAL_TABLET | Freq: Three times a day (TID) | ORAL | Status: DC

## 2012-06-01 MED ORDER — FENTANYL 75 MCG/HR TD PT72: 1.0000 | MEDICATED_PATCH | TRANSDERMAL | Status: AC

## 2012-06-01 NOTE — Progress Notes (Signed)
Patient ID: Jill Phillips, female   DOB: 01-Dec-1955, 56 y.o.   MRN: [id number] Doing well.  Low hgb (7.4).  Otherwise stable and wants to go home.  Plan:  - transfuse one unit of blood this am and d/c this afternoon to home.

## 2012-06-01 NOTE — Progress Notes (Signed)
D/c instructions given and explained to pt. Prescriptions given to pt.  Iv site d/c and pressure dressing applied.  VSS, afebrile.  Pt ready for d/c, d/c home at this time. Home health set up and 3-in-1 will be delivered to house.

## 2012-06-01 NOTE — Progress Notes (Signed)
Physical Therapy Treatment Patient Details Name: Jill Phillips MRN: [id number] DOB: 1955-12-28 Today's Date: 06/01/2012 Time: [id number] PT Time Calculation (min): 23 min  PT Assessment / Plan / Recommendation Comments on Treatment Session  Pt with low BP and receiving unit of blood so only performed exercises in bed.  Also discussed appropriate safe use of ice.  Will ambulated on second visit today prior to d/c home.    Follow Up Recommendations  Home health PT    Barriers to Discharge        Equipment Recommendations  3 in 1 bedside comode;Tub/shower bench    Recommendations for Other Services    Frequency     Plan Discharge plan remains appropriate    Precautions / Restrictions Precautions Precautions: Knee Restrictions RLE Weight Bearing: Weight bearing as tolerated   Pertinent Vitals/Pain 4/10 R knee pain, ice applied    Mobility  Bed Mobility Bed Mobility: Sit to Supine;Supine to Sit;Sitting - Scoot to Edge of Bed Supine to Sit: 6: Modified independent (Device/Increase time) Sitting - Scoot to Edge of Bed: 6: Modified independent (Device/Increase time) Sit to Supine: 6: Modified independent (Device/Increase time) Transfers Sit to Stand: 5: Supervision Stand to Sit: 5: Supervision    Exercises Total Joint Exercises Ankle Circles/Pumps: AROM;Both;Other reps (comment) (30) Quad Sets: AROM;Both;20 reps Short Arc Quad: AROM;Strengthening;Right;20 reps Heel Slides: AAROM;Right;20 reps Hip ABduction/ADduction: AROM;Strengthening;Right;20 reps Straight Leg Raises: AROM;Right;10 reps   PT Diagnosis:    PT Problem List:   PT Treatment Interventions:     PT Goals Acute Rehab PT Goals PT Goal: Perform Home Exercise Program - Progress: Progressing toward goal  Visit Information  Last PT Received On: 06/01/12 Assistance Needed: +1    Subjective Data  Subjective: I'm tired.   Cognition  Overall Cognitive Status: Appears within functional limits for tasks  assessed/performed Arousal/Alertness: Lethargic Orientation Level: Appears intact for tasks assessed Behavior During Session: [facility] for tasks performed    Balance     End of Session PT - End of Session Activity Tolerance: Patient tolerated treatment well Patient left: in bed;with call bell/phone within reach   GP     Trisha Morandi,KATHrine E 06/01/2012, 1:47 PM Pager: [phone]

## 2012-06-01 NOTE — Progress Notes (Signed)
Occupational Therapy Treatment Patient Details Name: Jill Phillips MRN: [id number] DOB: Oct 06, 1955 Today's Date: 06/01/2012 Time: [id number] OT Time Calculation (min): 32 min  OT Assessment / Plan / Recommendation Comments on Treatment Session Pt progressing with functional mobility and transfers, hoever cont to be limited by pain and lethargy (Hgb 7.4, pt receiving blood). Pt overall Supervision functional mobility to/from bathroom & toilet transfers as well as stand at sink to groom. Pt plans to d/c home later today and will have assist from daughter as needed. She has not expressed any concerns regarding ADL's & states that she has some long handled A/E from a previous surgery. Will follow pt acutely for OT until discharged.    Follow Up Recommendations  Home health OT;Supervision/Assistance - 24 hour           Equipment Recommendations  3 in 1 bedside comode;Tub/shower bench        Frequency Min 2X/week   Plan Discharge plan remains appropriate    Precautions / Restrictions   Knee; WBAT RLE;  Low Hgb 9/3/013 getting blood, RN "ok'd" treatment session as pt anticipates d/c later today w/ 24 hr assist/supervision.   Pertinent Vitals/Pain Pt reports R knee pain as 6/10. RN notified and in room at conclusion of treatment session. Pt repositioned and rest.    ADL  Eating/Feeding: Performed;Modified independent Where Assessed - Eating/Feeding: Bed level Grooming: Performed;Wash/dry hands;Brushing hair;Supervision/safety Where Assessed - Grooming: Unsupported standing Toilet Transfer: Performed;Min Radio broadcast assistant Method: Sit to Loss adjuster, chartered: Grab bars;Raised toilet seat with arms (or 3-in-1 over toilet) Toileting - Clothing Manipulation and Hygiene: Performed;Supervision/safety Where Assessed - Toileting Clothing Manipulation and Hygiene: Sit to stand from 3-in-1 or toilet Tub/Shower Transfer Method: Not assessed Equipment Used: Gait  belt;Rolling walker ADL Comments: Pt receiving blood (secondary to Hgb 7.4 this am) this early afternoon, OT spoke w/ RN whom stated that pt is anticipating d/c home later today and ok to get up if willing. Upon entering room, pt requested going to bathroom. Overall, pt is Supervision level amb w/ RW into bathroom & all aspects of toileting. Pt reports that she will have assistance from her daughter when d/c today. Discussed A/E & it's use (pt has LH reacher, 3:1 per her report from previous hip surgery).Pt was supervision level stand at sink to wash/dry hands after voiding & Mod I bed mobility & supervision sit-stand this afternoon, & Mod I sit-supine in bed after treatment. Recommend cont acute OT as in-pt to maximize independence with ADL's however, pt anticipating d/c later today.     OT Diagnosis:    OT Problem List:   OT Treatment Interventions:     OT Goals ADL Goals ADL Goal: Grooming - Progress: Met ADL Goal: Toilet Transfer - Progress: Progressing toward goals ADL Goal: Toileting - Clothing Manipulation - Progress: Progressing toward goals ADL Goal: Toileting - Hygiene - Progress: Met  Visit Information  Last OT Received On: 06/01/12 Assistance Needed: +1    Subjective Data  Subjective: My leg hurts but I need to go to the bathroom Patient Stated Goal: Decreased pain, return home   Prior Functioning       Cognition  Overall Cognitive Status: Appears within functional limits for tasks assessed/performed Arousal/Alertness: Lethargic Orientation Level: Appears intact for tasks assessed Behavior During Session: [facility] for tasks performed    Mobility  Shoulder Instructions Bed Mobility Bed Mobility: Sit to Supine;Supine to Sit;Sitting - Scoot to Edge of Bed Supine to Sit: 6: Modified independent (Device/Increase  time) Sitting - Scoot to Edge of Bed: 6: Modified independent (Device/Increase time) Sit to Supine: 6: Modified independent (Device/Increase  time) Transfers Transfers: Sit to Stand;Stand to Sit Sit to Stand: 5: Supervision Stand to Sit: 5: Supervision       Exercises      Balance     End of Session OT - End of Session Equipment Utilized During Treatment: Gait belt;Other (comment) (RW) Activity Tolerance: Patient limited by fatigue;Patient limited by pain Patient left: in bed;with call bell/phone within reach;with nursing in room  [place], [place] 06/01/2012, 1:38 PM

## 2012-06-01 NOTE — Progress Notes (Signed)
Physical Therapy Treatment Note   06/01/12 1500  PT Visit Information  Last PT Received On 06/01/12  Assistance Needed +1  PT Time Calculation  PT Start Time 1444  PT Stop Time 1504  PT Time Calculation (min) 20 min  Subjective Data  Subjective I ready to go home.  Precautions  Precautions Knee  Restrictions  RLE Weight Bearing WBAT  Cognition  Overall Cognitive Status Appears within functional limits for tasks assessed/performed  Bed Mobility  Bed Mobility Sit to Supine;Supine to Sit  Supine to Sit 6: Modified independent (Device/Increase time)  Transfers  Transfers Sit to Stand;Stand to Sit  Sit to Stand 5: Supervision  Stand to Sit 5: Supervision  Details for Transfer Assistance verbal cue to back up to recliner  Ambulation/Gait  Ambulation/Gait Assistance 4: Min guard;5: Supervision  Ambulation Distance (Feet) 160 Feet  Assistive device Rolling walker  Ambulation/Gait Assistance Details verbal cue for heel strike as pt reports walking on lateral foot first  Gait Pattern Step-to pattern;Decreased stance time - right;Decreased step length - left;Antalgic;Trunk flexed  Gait velocity decreased  PT - End of Session  Activity Tolerance Patient tolerated treatment well  Patient left in chair;with call bell/phone within reach  PT - Assessment/Plan  Comments on Treatment Session Pt doing well this afternoon and ambulated in hallway denies dizziness.  Pt had no questions/concerns with d/c home.  PT Plan Discharge plan remains appropriate  Follow Up Recommendations Home health PT  Equipment Recommended 3 in 1 bedside comode;Tub/shower bench  Acute Rehab PT Goals  PT Goal: Supine/Side to Sit - Progress Met  PT Goal: Sit to Stand - Progress Partly met  PT Goal: Ambulate - Progress Partly met  PT General Charges  $$ ACUTE PT VISIT 1 Procedure  PT Treatments  $Gait Training 8-22 mins    Carmelia Bake, PT Pager: [phone]

## 2012-06-01 NOTE — Care Management Note (Signed)
    Page 1 of 2   06/01/2012     5:29:33 PM   CARE MANAGEMENT NOTE 06/01/2012  Patient:  Jill Phillips, Jill Phillips   Account Number:  [id number]  Date Initiated:  05/30/2012  Documentation initiated by:  Caryl Asp  Subjective/Objective Assessment:   Patient admitted post-surgical knee replacement.     Action/Plan:   Patient will be discharged to home with home health when medically stable.   Anticipated DC Date:  06/02/2012   Anticipated DC Plan:  [place]  In-house referral  NA      DC Planning Services  CM consult      PAC Choice  [place]   Choice offered to / List presented to:  C-1 Patient   DME arranged  3-N-1  OTHER - SEE COMMENT      DME agency  [place] arranged  HH-2 PT      [place].   Status of service:  Completed, signed off Medicare Important Message given?  NA - LOS <3 / Initial given by admissions (If response is "NO", the following Medicare IM given date fields will be blank) Date Medicare IM given:   Date Additional Medicare IM given:    Discharge Disposition:  [place]  Per UR Regulation:    If discussed at Long Length of Stay Meetings, dates discussed:    Comments:  06/01/2012 [place] Highland BSN CCM [phone] Pt is requesting 3n1 and shower chair. Huey Romans is Armed forces logistics/support/administrative officer for [employer] called-intake requested orders, face sheet and H&P to be fAXED TO [phone]-CONFIRMATION RECEIVED. Huey Romans will deliver dme to pt's home. [facility] notified of pt's discharge for today. [place] services will start tomorrow-06/02/2012.  05/31/12 [place] [id number] Cm spoke with patient concerning dc planning. Per pt [facility] to provide [facility] services upon discharge. Pt states having RW at home for DME use. Pt states daughter to assist in home care. No other needs specified at this time. [facility] faxed demographics, H/P, progress notes at [phone].  Confirmation received for new referral.  05/30/2012  8:40am  Caryl Asp RN, case manager   [phone] PT/OT evaluation jsut ordered on 9/27, need to wait for the assessment to determine home health needs. Will offer patient choice of home health agency.

## 2012-06-01 NOTE — Discharge Summary (Signed)
Patient ID: Jill Phillips MRN: [id number] DOB/AGE: 57/22/1957 57 y.o.  Admit date: 05/29/2012 Discharge date: 06/01/2012  Admission Diagnoses:  Principal Problem:  *Arthritis of knee, right   Discharge Diagnoses:  Same  Past Medical History  Diagnosis Date  . Postlaminectomy syndrome, thoracic region   . Osteoarthrosis, unspecified whether generalized or localized, lower leg   . Dysthymic disorder   . Calcifying tendinitis of shoulder   . Pain in joint, upper arm   . Chronic pain syndrome   . Lumbago   . Primary localized osteoarthrosis, lower leg   . Hypertension   . Hyperlipidemia   . GERD (gastroesophageal reflux disease)   . Thyroid disease   . Restless leg syndrome   . PONV (postoperative nausea and vomiting)   . Heart murmur   . Sleep apnea     s/p surgery- last sleep study 2011- doesnt use oxygen or machine at night as instructed  . History of blood transfusion 1980  . Hypothyroidism     Surgeries: Procedure(s): TOTAL KNEE ARTHROPLASTY on 05/29/2012   Consultants:    Discharged Condition: Improved  Hospital Course: Jill Phillips is an 56 y.o. female who was admitted 05/29/2012 for operative treatment ofArthritis of knee, right. Patient has severe unremitting pain that affects sleep, daily activities, and work/hobbies. After pre-op clearance the patient was taken to the operating room on 05/29/2012 and underwent  Procedure(s): TOTAL KNEE ARTHROPLASTY.    Patient was given perioperative antibiotics: Anti-infectives     Start     Dose/Rate Route Frequency Ordered Stop   05/29/12 2200   valACYclovir (VALTREX) tablet 500 mg        500 mg Oral Daily at bedtime 05/29/12 1650     05/29/12 2000   clindamycin (CLEOCIN) IVPB 600 mg        600 mg 100 mL/hr over 30 Minutes Intravenous Every 6 hours 05/29/12 1650 05/30/12 0150   05/29/12 0952   clindamycin (CLEOCIN) IVPB 900 mg        900 mg 100 mL/hr over 30 Minutes Intravenous 60 min pre-op 05/29/12 [id number] 05/29/12  1245           Patient was given sequential compression devices, early ambulation, and chemoprophylaxis to prevent DVT.  Patient benefited maximally from hospital stay and there were no complications.    Recent vital signs: Patient Vitals for the past 24 hrs:  BP Temp Temp src Pulse Resp SpO2  06/01/12 0622 - - - - - 99 %  06/01/12 0448 102/58 mmHg 99 F (37.2 C) Oral 84  12  100 %  06/01/12 0020 - - - - 18  99 %  10-Jun-2012 2120 108/62 mmHg - - - - -  2012-06-10 2058 92/44 mmHg 99.1 F (37.3 C) Oral 80  20  100 %  Jun 10, 2012 1945 - - - - - 99 %  06-10-12 1607 118/67 mmHg 98.8 F (37.1 C) Oral 89  16  100 %  06-10-12 1600 - - - - 16  100 %  Jun 10, 2012 1559 - - - - 16  99 %  10-Jun-2012 1200 - - - - 16  97 %  06/10/2012 1156 - - - - 16  97 %  Jun 10, 2012 0737 - - - - 16  97 %     Recent laboratory studies:  Basename 06/01/12 0400 2012-06-10 0430 05/30/12 0459  WBC 6.2 8.8 --  HGB 7.4* 8.9* --  HCT 21.7* 26.5* --  PLT 169 206 --  NA -- --  138  K -- -- 3.3*  CL -- -- 100  CO2 -- -- 29  BUN -- -- 14  CREATININE -- -- 1.02  GLUCOSE -- -- 98  INR -- -- --  CALCIUM -- -- 8.2*     Discharge Medications:     Medication List     As of 06/01/2012  6:56 AM    STOP taking these medications         diclofenac sodium 1 % Gel   Commonly known as: VOLTAREN      TAKE these medications         acetaminophen 500 MG tablet   Commonly known as: TYLENOL   Take 1,000 mg by mouth every 6 (six) hours as needed. For pain      ALPRAZolam 1 MG tablet   Commonly known as: XANAX   Take 1 mg by mouth daily as needed. For nerves      amLODipine 5 MG tablet   Commonly known as: NORVASC   Take 5 mg by mouth at bedtime.      aspirin 325 MG EC tablet   Take 1 tablet (325 mg total) by mouth 2 (two) times daily.      benazepril 20 MG tablet   Commonly known as: LOTENSIN   Take 20 mg by mouth at bedtime.      carisoprodol 350 MG tablet   Commonly known as: SOMA   Take 350 mg by mouth every 8  (eight) hours as needed.      clonazePAM 1 MG tablet   Commonly known as: KLONOPIN   Take 2 mg by mouth at bedtime as needed. For restless leg      cloNIDine 0.1 MG tablet   Commonly known as: CATAPRES   Take 0.1 mg by mouth 3 (three) times daily.      docusate sodium 100 MG capsule   Commonly known as: COLACE   Take 100-200 mg by mouth at bedtime.      fentaNYL 75 MCG/HR   Commonly known as: DURAGESIC - dosed mcg/hr   Place 1 patch (75 mcg total) onto the skin every 3 (three) days.      ferrous sulfate 325 (65 FE) MG tablet   Take 1 tablet (325 mg total) by mouth 3 (three) times daily with meals.      hydrochlorothiazide 12.5 MG tablet   Commonly known as: HYDRODIURIL   Take 12.5 mg by mouth at bedtime.      HYDROmorphone 2 MG tablet   Commonly known as: DILAUDID   Take 1 tablet (2 mg total) by mouth every 3 (three) hours as needed for pain.      levothyroxine 25 MCG tablet   Commonly known as: SYNTHROID, LEVOTHROID   Take 25 mcg by mouth at bedtime.      omeprazole 20 MG capsule   Commonly known as: PRILOSEC   Take 20 mg by mouth at bedtime.      OVER THE COUNTER MEDICATION   Take 1 tablet by mouth daily. vegetables laxative      pentazocine-naloxone 50-0.5 MG per tablet   Commonly known as: TALWIN NX   Take 1 tablet by mouth every 8 (eight) hours as needed. For pain      pramipexole 1 MG tablet   Commonly known as: MIRAPEX   Take 1 mg by mouth as needed.      promethazine 25 MG tablet   Commonly known as: PHENERGAN   Take 25 mg by  mouth daily as needed. For nausea      valACYclovir 500 MG tablet   Commonly known as: VALTREX   Take 500 mg by mouth at bedtime.      venlafaxine XR 150 MG 24 hr capsule   Commonly known as: EFFEXOR-XR   Take 300 mg by mouth at bedtime.      zaleplon 5 MG capsule   Commonly known as: SONATA   Take 5-10 mg by mouth at bedtime. Repeat if more than three hours left of sleep        Diagnostic Studies: Dg Chest 2  View  05/26/2012  *RADIOLOGY REPORT*  Clinical Data: Preop for knee replacement  CHEST - 2 VIEW  Comparison: Chest radiograph 08/20/2010  Findings: The patient is rotated to the right.  The heart, mediastinal, and hilar contours are normal.  The lungs are clear. No pleural effusion.  There are postoperative changes in the lower posterior thoracic spine, and in the lumbar spine, partially visualized.  There is a stable convex left curvature of the thoracic spine.  No acute bony abnormality identified.  IMPRESSION: No acute cardiopulmonary disease.   Original Report Authenticated By: Curlene Dolphin, M.D.    X-ray Knee Right Port  05/29/2012  *RADIOLOGY REPORT*  Clinical Data: Postop right knee arthroplasty.  PORTABLE RIGHT KNEE - 1-2 VIEW  Comparison: None.  Findings: The patient is status post right knee arthroplasty. Subcutaneous and joint air and fluid are noted.  IMPRESSION: Right knee arthroplasty with expected postoperative findings.   Original Report Authenticated By: Luretha Rued, M.D.     Disposition: to home      Discharge Orders    Future Orders Please Complete By Expires   Diet - low sodium heart healthy      Call MD / Call 911      Comments:   If you experience chest pain or shortness of breath, CALL 911 and be transported to the hospital emergency room.  If you develope a fever above 101 F, pus (white drainage) or increased drainage or redness at the wound, or calf pain, call your surgeon's office.   Constipation Prevention      Comments:   Drink plenty of fluids.  Prune juice may be helpful.  You may use a stool softener, such as Colace (over the counter) 100 mg twice a day.  Use MiraLax (over the counter) for constipation as needed.   Increase activity slowly as tolerated      Discharge instructions      Comments:   Increase your activities as comfort allows. Work on knee motion. You can get your actual incision wet in the shower starting 06/03/12; then daily dry dressing.    Discharge patient         Follow-up Information    Follow up with Mcarthur Rossetti, MD. In 2 weeks.   Contact information:   [facility] [place] [address] [phone]           Signed: Mcarthur Rossetti 06/01/2012, 6:56 AM

## 2012-06-02 ENCOUNTER — Encounter: Payer: Medicare HMO | Admitting: Physical Medicine and Rehabilitation

## 2012-06-02 LAB — TYPE AND SCREEN
ABO/RH(D): A POS
Antibody Screen: NEGATIVE
Unit division: 0

## 2012-06-03 ENCOUNTER — Other Ambulatory Visit: Payer: Self-pay | Admitting: Orthopaedic Surgery

## 2012-06-03 DIAGNOSIS — R52 Pain, unspecified: Secondary | ICD-10-CM

## 2012-06-04 ENCOUNTER — Ambulatory Visit: Payer: Medicare HMO | Admitting: Physical Medicine and Rehabilitation

## 2012-06-04 ENCOUNTER — Telehealth: Payer: Self-pay | Admitting: *Deleted

## 2012-06-04 ENCOUNTER — Ambulatory Visit
Admission: RE | Admit: 2012-06-04 | Discharge: 2012-06-04 | Disposition: A | Discharge status: Home / Self Care | Payer: Medicare HMO | Source: Ambulatory Visit | Attending: Orthopaedic Surgery | Admitting: Orthopaedic Surgery

## 2012-06-04 DIAGNOSIS — R52 Pain, unspecified: Secondary | ICD-10-CM

## 2012-06-04 NOTE — Telephone Encounter (Signed)
Needs refill on Talwin. She had a knee replacement and surgeon gave her a prescription for this but she didn't get it filled.

## 2012-06-05 ENCOUNTER — Encounter: Payer: Self-pay | Admitting: *Deleted

## 2012-06-05 MED ORDER — PENTAZOCINE-NALOXONE 50-0.5 MG PO TABS: 1.0000 | ORAL_TABLET | Freq: Three times a day (TID) | ORAL | Status: DC | PRN

## 2012-06-05 NOTE — Progress Notes (Signed)
Jill Phillips called for her Talwin to be refilled. She has recently had a TKR. She had a prescription that [facility] was holding for her that Dr Rush Farmer had give her but it was for "20" and she had not filled it.  Her prescription was called in to her routine pharmacy. I called and spoke with Sam at [facility] and had the previous prescription destroyed. I noted that she had been prescribed Dilaudid as well as her routine fentanyl by Dr Rush Farmer. I called to speak with Rodena Piety and she said that she cannot take Dilaudid-- she is allergic to it. It is listed in her chart as an allergy and she reiterated she cannot take it.

## 2012-06-05 NOTE — Telephone Encounter (Signed)
Jill Phillips has called in medication. Pt is aware.

## 2012-06-09 ENCOUNTER — Other Ambulatory Visit: Payer: Self-pay | Admitting: Physical Medicine & Rehabilitation

## 2012-06-15 ENCOUNTER — Other Ambulatory Visit: Payer: Self-pay | Admitting: Physical Medicine & Rehabilitation

## 2012-06-15 ENCOUNTER — Other Ambulatory Visit: Payer: Self-pay | Admitting: *Deleted

## 2012-06-15 MED ORDER — CARISOPRODOL 350 MG PO TABS: 350.0000 mg | ORAL_TABLET | Freq: Three times a day (TID) | ORAL | Status: DC | PRN

## 2012-06-15 MED ORDER — CLONAZEPAM 1 MG PO TABS: 2.0000 mg | ORAL_TABLET | Freq: Every evening | ORAL | Status: DC | PRN

## 2012-06-25 ENCOUNTER — Ambulatory Visit: Payer: Medicare HMO | Attending: Orthopaedic Surgery | Admitting: Physical Therapy

## 2012-06-25 DIAGNOSIS — Z01818 Encounter for other preprocedural examination: Secondary | ICD-10-CM | POA: Insufficient documentation

## 2012-06-25 DIAGNOSIS — M25569 Pain in unspecified knee: Secondary | ICD-10-CM | POA: Insufficient documentation

## 2012-06-25 DIAGNOSIS — R262 Difficulty in walking, not elsewhere classified: Secondary | ICD-10-CM | POA: Insufficient documentation

## 2012-06-25 DIAGNOSIS — M25669 Stiffness of unspecified knee, not elsewhere classified: Secondary | ICD-10-CM | POA: Insufficient documentation

## 2012-06-25 DIAGNOSIS — Z96659 Presence of unspecified artificial knee joint: Secondary | ICD-10-CM | POA: Insufficient documentation

## 2012-06-26 ENCOUNTER — Other Ambulatory Visit: Payer: Self-pay | Admitting: Physical Medicine & Rehabilitation

## 2012-06-30 ENCOUNTER — Telehealth: Payer: Self-pay

## 2012-06-30 ENCOUNTER — Ambulatory Visit: Payer: Medicare HMO | Admitting: Physical Therapy

## 2012-06-30 NOTE — Telephone Encounter (Signed)
Do you want to ok this refill on Talwin? Its a little early yet because the las rx was 06/05/12 but you only give her 52.  (She has had some other stuff because of her surgery,though)

## 2012-06-30 NOTE — Telephone Encounter (Signed)
Patient called to follow up on her talwen? refill

## 2012-06-30 NOTE — Telephone Encounter (Signed)
i've already responded to this in writing via received fax. Please see rx

## 2012-07-01 MED ORDER — PENTAZOCINE-NALOXONE 50-0.5 MG PO TABS: 1.0000 | ORAL_TABLET | Freq: Three times a day (TID) | ORAL | Status: DC | PRN

## 2012-07-01 NOTE — Telephone Encounter (Signed)
Medication called in 

## 2012-07-02 ENCOUNTER — Ambulatory Visit: Payer: Medicare HMO | Admitting: Physical Therapy

## 2012-07-07 ENCOUNTER — Ambulatory Visit: Payer: Medicare HMO | Attending: Orthopaedic Surgery | Admitting: Physical Therapy

## 2012-07-07 DIAGNOSIS — M25569 Pain in unspecified knee: Secondary | ICD-10-CM | POA: Insufficient documentation

## 2012-07-07 DIAGNOSIS — M25669 Stiffness of unspecified knee, not elsewhere classified: Secondary | ICD-10-CM | POA: Insufficient documentation

## 2012-07-07 DIAGNOSIS — Z96659 Presence of unspecified artificial knee joint: Secondary | ICD-10-CM | POA: Insufficient documentation

## 2012-07-07 DIAGNOSIS — R262 Difficulty in walking, not elsewhere classified: Secondary | ICD-10-CM | POA: Insufficient documentation

## 2012-07-07 DIAGNOSIS — Z01818 Encounter for other preprocedural examination: Secondary | ICD-10-CM | POA: Insufficient documentation

## 2012-07-09 ENCOUNTER — Ambulatory Visit: Payer: Medicare HMO | Admitting: Physical Therapy

## 2012-07-14 ENCOUNTER — Ambulatory Visit: Payer: Medicare HMO | Admitting: Physical Therapy

## 2012-07-16 ENCOUNTER — Ambulatory Visit: Payer: Medicare HMO | Admitting: Physical Therapy

## 2012-08-03 ENCOUNTER — Telehealth: Payer: Self-pay | Admitting: *Deleted

## 2012-08-03 MED ORDER — PENTAZOCINE-NALOXONE 50-0.5 MG PO TABS
1.0000 | ORAL_TABLET | Freq: Three times a day (TID) | ORAL | Status: DC | PRN
Start: 2012-08-03 — End: 2012-09-01

## 2012-08-03 MED ORDER — CLONAZEPAM 1 MG PO TABS: 2.0000 mg | ORAL_TABLET | Freq: Every evening | ORAL | Status: DC | PRN

## 2012-08-03 MED ORDER — CARISOPRODOL 350 MG PO TABS: 350.0000 mg | ORAL_TABLET | Freq: Three times a day (TID) | ORAL | Status: DC | PRN

## 2012-08-03 NOTE — Telephone Encounter (Signed)
Patient called requesting refill on her Talwin, Soma and Klonipin. She uses [facility]

## 2012-08-03 NOTE — Telephone Encounter (Signed)
Patient states she has been out of her Jill Phillips and Klonipin for almost a week. Has started going through withdrawls. Pharmacy says we denied medication

## 2012-08-03 NOTE — Telephone Encounter (Signed)
Medications called in. Patient aware.

## 2012-08-03 NOTE — Telephone Encounter (Signed)
Refill talwin. Refilled and Lezli notified.

## 2012-08-05 ENCOUNTER — Ambulatory Visit (INDEPENDENT_AMBULATORY_CARE_PROVIDER_SITE_OTHER): Payer: Medicare HMO | Admitting: Physician Assistant

## 2012-08-05 VITALS — BP 144/86 | HR 80 | Temp 98.0°F | Resp 16 | Ht 64.25 in | Wt 141.4 lb

## 2012-08-05 DIAGNOSIS — Z23 Encounter for immunization: Secondary | ICD-10-CM

## 2012-08-05 DIAGNOSIS — R350 Frequency of micturition: Secondary | ICD-10-CM

## 2012-08-05 LAB — POCT UA - MICROSCOPIC ONLY
Casts, Ur, LPF, POC: NEGATIVE
Crystals, Ur, HPF, POC: NEGATIVE
Mucus, UA: NEGATIVE
Yeast, UA: NEGATIVE

## 2012-08-05 LAB — GLUCOSE, POCT (MANUAL RESULT ENTRY): POC Glucose: 87 mg/dl (ref 70–99)

## 2012-08-05 LAB — POCT URINALYSIS DIPSTICK
Bilirubin, UA: NEGATIVE
Glucose, UA: 100
Ketones, UA: NEGATIVE
Leukocytes, UA: NEGATIVE
Nitrite, UA: POSITIVE
Spec Grav, UA: <=1.005
Urobilinogen, UA: 1
pH, UA: 5

## 2012-08-05 MED ORDER — CIPROFLOXACIN HCL 500 MG PO TABS: 500.0000 mg | ORAL_TABLET | Freq: Two times a day (BID) | ORAL | Status: DC

## 2012-08-05 MED ORDER — CEFTRIAXONE SODIUM 1 G IJ SOLR
1.0000 g | Freq: Once | INTRAMUSCULAR | Status: AC
  Administered 2012-08-05: 1 g via INTRAMUSCULAR

## 2012-08-05 NOTE — Patient Instructions (Addendum)
Get plenty of rest and drink at least 64 ounces of water daily. Schedule a visit with your primary care provider at your convenience in the next month or two.

## 2012-08-05 NOTE — Progress Notes (Signed)
Subjective:    Patient ID: Jill Phillips, female    DOB: 1956/03/13, 56 y.o.   MRN: [id number]  HPI This 56 y.o. female presents for evaluation of low pelvic pain, similar to when she's had previous bladder infections.  She also reports back pain, which is not typical with her bladder symptoms.  Increased urinary frequency, no urgency (previously she's had urinary incontinence when "I've waited too long."), no hematuria.  No vaginal discharge. She is not sexually active.  Feels dizzy when lying flat x 2 weeks.  Resolves when she sits back up. Doesn't feel bad otherwise.  No GI disturbance.  Past Medical History  Diagnosis Date  . Postlaminectomy syndrome, thoracic region   . Osteoarthrosis, unspecified whether generalized or localized, lower leg   . Dysthymic disorder   . Calcifying tendinitis of shoulder   . Pain in joint, upper arm   . Chronic pain syndrome   . Lumbago   . Primary localized osteoarthrosis, lower leg   . Hypertension   . Hyperlipidemia   . GERD (gastroesophageal reflux disease)   . Thyroid disease   . Restless leg syndrome   . PONV (postoperative nausea and vomiting)   . Heart murmur   . Sleep apnea     s/p surgery- last sleep study 2011- doesnt use oxygen or machine at night as instructed  . History of blood transfusion 1980  . Hypothyroidism     Past Surgical History  Procedure Date  . Appendectomy   . Abdominal hysterectomy   . Tubal ligation   . Spine surgery     thoracic x 1,  lumbar x 15  . Right hip replacement   . Knee surgeries r knee     arthroscopy- right  . Hammer toe surgery   . Sleep apnea surgery   . Cholecystectomy   . Back surgery   . Joint replacement   . Total knee arthroplasty 05/29/2012    Procedure: TOTAL KNEE ARTHROPLASTY;  Surgeon: Mcarthur Rossetti, MD;  Location: [facility];  Service: Orthopedics;  Laterality: Right;  Right Total Knee Arthroplasty    Prior to Admission medications   Medication Sig Start Date End Date  Taking? Authorizing Provider  acetaminophen (TYLENOL) 500 MG tablet Take 1,000 mg by mouth every 6 (six) hours as needed. For pain   Yes Historical Provider, MD  ALPRAZolam Duanne Moron) 1 MG tablet Take 1 mg by mouth daily as needed. For nerves   Yes Historical Provider, MD  amLODipine (NORVASC) 5 MG tablet Take 5 mg by mouth at bedtime.    Yes Historical Provider, MD  benazepril (LOTENSIN) 20 MG tablet Take 20 mg by mouth at bedtime.    Yes Historical Provider, MD  carisoprodol (SOMA) 350 MG tablet Take 1 tablet (350 mg total) by mouth every 8 (eight) hours as needed. 08/03/12  Yes Meredith Staggers, MD  clonazePAM (KLONOPIN) 1 MG tablet Take 2 tablets (2 mg total) by mouth at bedtime as needed. For restless leg 08/03/12  Yes Meredith Staggers, MD  cloNIDine (CATAPRES) 0.1 MG tablet Take 0.1 mg by mouth 3 (three) times daily.   Yes Historical Provider, MD  fentaNYL (DURAGESIC - DOSED MCG/HR) 75 MCG/HR Place 1 patch onto the skin every 3 (three) days.   Yes Historical Provider, MD  hydrochlorothiazide (HYDRODIURIL) 12.5 MG tablet Take 12.5 mg by mouth at bedtime.    Yes Historical Provider, MD  levothyroxine (SYNTHROID, LEVOTHROID) 25 MCG tablet Take 25 mcg by mouth at bedtime.  Yes Historical Provider, MD  omeprazole (PRILOSEC) 20 MG capsule Take 20 mg by mouth at bedtime.    Yes Historical Provider, MD  OVER THE COUNTER MEDICATION Take 1 tablet by mouth daily. vegetables laxative   Yes Historical Provider, MD  pentazocine-naloxone (TALWIN NX) 50-0.5 MG per tablet Take 1 tablet by mouth every 8 (eight) hours as needed. For pain 08/03/12  Yes Meredith Staggers, MD  promethazine (PHENERGAN) 25 MG tablet Take 25 mg by mouth daily as needed. For nausea   Yes Historical Provider, MD  valACYclovir (VALTREX) 500 MG tablet Take 500 mg by mouth at bedtime.    Yes Historical Provider, MD  venlafaxine (EFFEXOR-XR) 150 MG 24 hr capsule Take 300 mg by mouth at bedtime.    Yes Historical Provider, MD  VOLTAREN 1 % GEL  APPLY 1 APPLICATION TOPICALLY 3 TIMES DAILY. 06/15/12  Yes Meredith Staggers, MD  zaleplon (SONATA) 5 MG capsule Take 5-10 mg by mouth at bedtime. Repeat if more than three hours left of sleep 03/16/12  Yes Historical Provider, MD    Allergies  Allergen Reactions  . Amoxicillin     Oral yeast infection  . Chlorzoxazone     headache  . Codeine     headache  . Darvocet (Propoxyphene-Acetaminophen) Itching  . Dilaudid (Hydromorphone Hcl) Itching  . Morphine And Related Itching  . Nitrofurantoin Monohyd Macro Hives  . Oxycodone-Acetaminophen Itching  . Percocet (Oxycodone-Acetaminophen) Itching  . Sulfa Antibiotics     Yeast infection in mouth    History   Social History  . Marital Status: Divorced    Spouse Name: n/a    Number of Children: 2  . Years of Education: 12+   Occupational History  . disability     back surgeries   Social History Main Topics  . Smoking status: Current Every Day Smoker -- 1.5 packs/day for 45 years    Types: Cigarettes  . Smokeless tobacco: Never Used  . Alcohol Use: No  . Drug Use: No  . Sexually Active: Not Currently -- Female partner(s)   Other Topics Concern  . Not on file   Social History Narrative   Lives alone.  One daughter is local, but is getting ready to move to [place], where her children live with their father.  The other daughter lives near [place], [place].    Family History  Problem Relation Age of Onset  . Kidney disease Mother   . Heart disease Father   . Anuerysm Brother 29    brain  . Heart disease Brother   . Heart disease Sister 5    s/p CABG  . Hypertension Sister     Review of Systems As above. No URI-Type symptoms    Objective:   Physical Exam Blood pressure 174/94, pulse 80, temperature 98 F (36.7 C), temperature source Oral, resp. rate 16, height 5' 4.25" (1.632 m), weight 141 lb 6.4 oz (64.139 kg), SpO2 98.00%. Body mass index is 24.08 kg/(m^2). Repeat BP at the end of her visit is improved at  144/86. Well-developed, well nourished WF who is awake, alert and oriented, in NAD. HEENT: [place]/AT, sclera and conjunctiva are clear.   Neck: supple, non-tender, no lymphadenopathy, thyromegaly. Heart: RRR, no murmur Lungs: normal effort, CTA Abdomen: normo-active bowel sounds, supple, no mass or organomegaly.  Suprapubic tenderness.  Mild RIGHT CVA tenderness. Extremities: no cyanosis, clubbing or edema. Skin: warm and dry without rash. Psychologic: good mood and appropriate affect, normal speech and behavior.  Results  for orders placed in visit on 08/05/12  POCT UA - MICROSCOPIC ONLY      Component Value Range   WBC, Ur, HPF, POC 2-5     RBC, urine, microscopic 0-1     Bacteria, U Microscopic 2+     Mucus, UA neg     Epithelial cells, urine per micros 0-5     Crystals, Ur, HPF, POC neg     Casts, Ur, LPF, POC neg     Yeast, UA neg     Amorphous moderate    POCT URINALYSIS DIPSTICK      Component Value Range   Color, UA Orange     Clarity, UA clear     Glucose, UA 100  Took Azo-Stat   Bilirubin, UA neg     Ketones, UA neg     Spec Grav, UA <=1.005     Blood, UA trace-intact     pH, UA 5.0     Protein, UA trace     Urobilinogen, UA 1.0     Nitrite, UA positive     Leukocytes, UA Negative     GLUCOSE, POCT (MANUAL RESULT ENTRY)      Component Value Range   POC Glucose 87  70 - 99 mg/dl       Assessment & Plan:   1. Urinary frequency  POCT UA - Microscopic Only, POCT urinalysis dipstick, POCT glucose (manual entry), cefTRIAXone (ROCEPHIN) injection 1 g, ciprofloxacin (CIPRO) 500 MG tablet, Urine culture  2. Need for influenza vaccination  Flu vaccine greater than or equal to 3yo preservative free IM   She requested the rocephin, since that was given to her previously and she thinks it really helped. Patient Instructions  Get plenty of rest and drink at least 64 ounces of water daily. Schedule a visit with your primary care provider at your convenience in the next month  or two.

## 2012-08-07 LAB — URINE CULTURE
Colony Count: NO GROWTH
Organism ID, Bacteria: NO GROWTH

## 2012-08-08 ENCOUNTER — Telehealth: Payer: Self-pay

## 2012-08-08 NOTE — Telephone Encounter (Signed)
Patient states she got results that bladder infection was negative, but still is still having same pain. She is also having pains behind her belly button, which she states is what normally has when she has a bladder infection. Patient is curious to know if kidney infection might be a possibility. Please advise. [phone]

## 2012-08-08 NOTE — Telephone Encounter (Signed)
Spoke with pt advised message from [place]. Pt understood.

## 2012-08-08 NOTE — Telephone Encounter (Signed)
That is unlikely as that too would show up in the urine culture. Perhaps it would be best for her to recheck.

## 2012-08-24 ENCOUNTER — Other Ambulatory Visit: Payer: Self-pay | Admitting: *Deleted

## 2012-08-27 ENCOUNTER — Other Ambulatory Visit: Payer: Self-pay

## 2012-09-01 ENCOUNTER — Encounter: Payer: Medicare HMO | Attending: Physical Medicine & Rehabilitation | Admitting: Physical Medicine & Rehabilitation

## 2012-09-01 ENCOUNTER — Encounter: Payer: Self-pay | Admitting: Physical Medicine & Rehabilitation

## 2012-09-01 VITALS — BP 129/80 | HR 103 | Resp 14 | Ht 64.0 in | Wt 146.0 lb

## 2012-09-01 DIAGNOSIS — M961 Postlaminectomy syndrome, not elsewhere classified: Secondary | ICD-10-CM | POA: Insufficient documentation

## 2012-09-01 DIAGNOSIS — M75101 Unspecified rotator cuff tear or rupture of right shoulder, not specified as traumatic: Secondary | ICD-10-CM

## 2012-09-01 DIAGNOSIS — M75100 Unspecified rotator cuff tear or rupture of unspecified shoulder, not specified as traumatic: Secondary | ICD-10-CM | POA: Insufficient documentation

## 2012-09-01 DIAGNOSIS — IMO0002 Reserved for concepts with insufficient information to code with codable children: Secondary | ICD-10-CM | POA: Insufficient documentation

## 2012-09-01 DIAGNOSIS — M170 Bilateral primary osteoarthritis of knee: Secondary | ICD-10-CM

## 2012-09-01 DIAGNOSIS — M67919 Unspecified disorder of synovium and tendon, unspecified shoulder: Secondary | ICD-10-CM | POA: Insufficient documentation

## 2012-09-01 DIAGNOSIS — G2581 Restless legs syndrome: Secondary | ICD-10-CM

## 2012-09-01 DIAGNOSIS — M719 Bursopathy, unspecified: Secondary | ICD-10-CM | POA: Insufficient documentation

## 2012-09-01 DIAGNOSIS — G8929 Other chronic pain: Secondary | ICD-10-CM | POA: Insufficient documentation

## 2012-09-01 DIAGNOSIS — F17200 Nicotine dependence, unspecified, uncomplicated: Secondary | ICD-10-CM | POA: Insufficient documentation

## 2012-09-01 DIAGNOSIS — M1710 Unilateral primary osteoarthritis, unspecified knee: Secondary | ICD-10-CM | POA: Insufficient documentation

## 2012-09-01 DIAGNOSIS — G4730 Sleep apnea, unspecified: Secondary | ICD-10-CM | POA: Insufficient documentation

## 2012-09-01 MED ORDER — OXCARBAZEPINE 150 MG PO TABS: 150.0000 mg | ORAL_TABLET | Freq: Two times a day (BID) | ORAL | Status: DC

## 2012-09-01 MED ORDER — PENTAZOCINE-NALOXONE 50-0.5 MG PO TABS: 1.0000 | ORAL_TABLET | Freq: Three times a day (TID) | ORAL | Status: DC | PRN

## 2012-09-01 MED ORDER — FENTANYL 75 MCG/HR TD PT72: 1.0000 | MEDICATED_PATCH | TRANSDERMAL | Status: DC

## 2012-09-01 NOTE — Patient Instructions (Signed)
CALL DR. PICKARD FOR FOLLOW UP. BE SURE TO CHECK YOUR THYROID, VITAMIN D, AND B12 LEVELS.    TAKE TRILEPTAL (NEW MEDICINE) AT NIGHT FOR 5 DAYS THEN TWICE DAILY

## 2012-09-01 NOTE — Progress Notes (Signed)
Subjective:    Patient ID: Jill Phillips, female    DOB: 11-07-1955, 56 y.o.   MRN: [id number]  HPI  Jill Phillips is back regarding her chronic pain. She had her Right TKA this fall, and thus far the results have been good. She is complaining of increased RLS which are impacting sleep and overall activity levels. She feels that the RLS is worst late in the day into the evening. She felt that the 138mcg of fentanyl was the only medicine that REALLY helped this pain. She is off the neurontin and mirapex currently. She takes 1-2 mg klonopin at night which she feels helps.   Pain Inventory Average Pain 5 Pain Right Now 6 My pain is constant, dull and aching  In the last 24 hours, has pain interfered with the following? General activity 7 Relation with others 7 Enjoyment of life 7 What TIME of day is your pain at its worst? constant Sleep (in general) Fair  Pain is worse with: walking, standing and some activites Pain improves with: rest and medication Relief from Meds: 3  Mobility walk without assistance how many minutes can you walk? 45 ability to climb steps?  yes do you drive?  yes  Function disabled: date disabled  I need assistance with the following:  household duties  Neuro/Psych No problems in this area  Prior Studies Any changes since last visit?  no  Physicians involved in your care Any changes since last visit?  no   Family History  Problem Relation Age of Onset  . Kidney disease Mother   . Heart disease Father   . Anuerysm Brother 29    brain  . Heart disease Brother   . Heart disease Sister 35    s/p CABG  . Hypertension Sister    History   Social History  . Marital Status: Divorced    Spouse Name: n/a    Number of Children: 2  . Years of Education: 12+   Occupational History  . disability     back surgeries   Social History Main Topics  . Smoking status: Current Every Day Smoker -- 1.5 packs/day for 45 years    Types: Cigarettes  . Smokeless  tobacco: Never Used  . Alcohol Use: No  . Drug Use: No  . Sexually Active: Not Currently -- Female partner(s)   Other Topics Concern  . None   Social History Narrative   Lives alone.  One daughter is local, but is getting ready to move to [place], where her children live with their father.  The other daughter lives near [place], [place].   Past Surgical History  Procedure Date  . Appendectomy   . Abdominal hysterectomy   . Tubal ligation   . Spine surgery     thoracic x 1,  lumbar x 15  . Right hip replacement   . Knee surgeries r knee     arthroscopy- right  . Hammer toe surgery   . Sleep apnea surgery   . Cholecystectomy   . Back surgery   . Joint replacement   . Total knee arthroplasty 05/29/2012    Procedure: TOTAL KNEE ARTHROPLASTY;  Surgeon: Mcarthur Rossetti, MD;  Location: [facility];  Service: Orthopedics;  Laterality: Right;  Right Total Knee Arthroplasty   Past Medical History  Diagnosis Date  . Postlaminectomy syndrome, thoracic region   . Osteoarthrosis, unspecified whether generalized or localized, lower leg   . Dysthymic disorder   . Calcifying tendinitis of shoulder   .  Pain in joint, upper arm   . Chronic pain syndrome   . Lumbago   . Primary localized osteoarthrosis, lower leg   . Hypertension   . Hyperlipidemia   . GERD (gastroesophageal reflux disease)   . Thyroid disease   . Restless leg syndrome   . PONV (postoperative nausea and vomiting)   . Heart murmur   . Sleep apnea     s/p surgery- last sleep study 2011- doesnt use oxygen or machine at night as instructed  . History of blood transfusion 1980  . Hypothyroidism    BP 129/80  Pulse 103  Resp 14  Ht 5\' 4"  (1.626 m)  Wt 146 lb (66.225 kg)  BMI 25.06 kg/m2  SpO2 96%   Review of Systems  Musculoskeletal: Positive for back pain.       Restless leg  All other systems reviewed and are negative.       Objective:   Physical Exam Constitutional: She is oriented to person, place, and  time. She appears well-developed and well-nourished.  HENT:  Head: Normocephalic.  Eyes: EOM are normal. Pupils are equal, round, and reactive to light.  Neck: Normal range of motion.  Cardiovascular: Normal rate.  Pulmonary/Chest: Effort normal.  Abdominal: Soft.  Musculoskeletal:  Right knee with minimal tenderness. Post-op scars noted. Knee rom 120 with flexion. Weight bearing better with less antalgia seen.  Right shoulder with mild tenderness with ROM. No shoulder instability noted.  Generalized tenderness of c-spine with some limitation in ROM present  Neurological: She is alert and oriented to person, place, and time.  Skin: Skin is warm.  Psychiatric: She has a normal mood and affect. Her behavior is normal. Judgment and thought content normal.  Assessment & Plan:   ASSESSMENT:  1. Chronic lumbar spine pain/post-lami syndrome  2. Osteoarthritis of the knees bilaterally, right greater than left.  3. Rotator cuff syndrome/subacromial bursitis.  4. Restless legs syndrome.  5. O2 dependent sleep apnea.  6. Tobacco abuse.    PLAN:  1. Jill Phillips feels that her RLS symptoms were better with the 162mcg of fentanyl. I told her that we would not be returning to that dose, and that my plan is to further wean the fentanyl. Again, We discussed opioid hypersensitivity.  2. We will continue with Klonopin for now--consider day time dosing.  3. I think we need to try another anticonvulsant for the RLS symptoms. I Initiated a trial of trileptal 150mg  bid, titrating up to this dose over the next 5 days.  4. Check with PCP regarding TFT's, vitamin D level, B12 which also could be impacting pain. i would be happy to draw those here in the office if she would like. 5. Refilled, soma #90, Talwin NX 1 q.8 hours p.r.n. #60 and Duragesic at 75 mcg 1  q.72h hours #10 each without a refill.   6. Follow up with me in one month.

## 2012-09-03 ENCOUNTER — Other Ambulatory Visit: Payer: Self-pay | Admitting: Physical Medicine & Rehabilitation

## 2012-09-07 ENCOUNTER — Other Ambulatory Visit: Payer: Self-pay | Admitting: *Deleted

## 2012-09-10 ENCOUNTER — Ambulatory Visit
Admission: RE | Admit: 2012-09-10 | Discharge: 2012-09-10 | Disposition: A | Discharge status: Home / Self Care | Payer: PRIVATE HEALTH INSURANCE | Source: Ambulatory Visit | Attending: Family Medicine | Admitting: Family Medicine

## 2012-09-10 ENCOUNTER — Other Ambulatory Visit: Payer: Self-pay | Admitting: Family Medicine

## 2012-09-10 DIAGNOSIS — R109 Unspecified abdominal pain: Secondary | ICD-10-CM

## 2012-09-10 DIAGNOSIS — R319 Hematuria, unspecified: Secondary | ICD-10-CM

## 2012-09-21 ENCOUNTER — Other Ambulatory Visit: Payer: Self-pay | Admitting: *Deleted

## 2012-09-21 DIAGNOSIS — M170 Bilateral primary osteoarthritis of knee: Secondary | ICD-10-CM

## 2012-09-21 DIAGNOSIS — G2581 Restless legs syndrome: Secondary | ICD-10-CM

## 2012-09-21 DIAGNOSIS — M75101 Unspecified rotator cuff tear or rupture of right shoulder, not specified as traumatic: Secondary | ICD-10-CM

## 2012-09-21 DIAGNOSIS — M961 Postlaminectomy syndrome, not elsewhere classified: Secondary | ICD-10-CM

## 2012-09-21 MED ORDER — PENTAZOCINE-NALOXONE 50-0.5 MG PO TABS: 1.0000 | ORAL_TABLET | Freq: Three times a day (TID) | ORAL | Status: DC | PRN

## 2012-09-28 ENCOUNTER — Other Ambulatory Visit: Payer: Self-pay | Admitting: Physical Medicine & Rehabilitation

## 2012-09-28 MED ORDER — CLONAZEPAM 1 MG PO TABS: 2.0000 mg | ORAL_TABLET | Freq: Every evening | ORAL | Status: DC | PRN

## 2012-09-29 ENCOUNTER — Encounter
Payer: PRIVATE HEALTH INSURANCE | Attending: Physical Medicine & Rehabilitation | Admitting: Physical Medicine & Rehabilitation

## 2012-09-29 ENCOUNTER — Encounter: Payer: Self-pay | Admitting: Physical Medicine & Rehabilitation

## 2012-09-29 ENCOUNTER — Other Ambulatory Visit: Payer: Self-pay | Admitting: Physical Medicine & Rehabilitation

## 2012-09-29 VITALS — BP 121/76 | HR 80 | Resp 14 | Ht 64.0 in | Wt 145.0 lb

## 2012-09-29 DIAGNOSIS — M1710 Unilateral primary osteoarthritis, unspecified knee: Secondary | ICD-10-CM | POA: Insufficient documentation

## 2012-09-29 DIAGNOSIS — M170 Bilateral primary osteoarthritis of knee: Secondary | ICD-10-CM

## 2012-09-29 DIAGNOSIS — M75101 Unspecified rotator cuff tear or rupture of right shoulder, not specified as traumatic: Secondary | ICD-10-CM

## 2012-09-29 DIAGNOSIS — M719 Bursopathy, unspecified: Secondary | ICD-10-CM | POA: Insufficient documentation

## 2012-09-29 DIAGNOSIS — M67919 Unspecified disorder of synovium and tendon, unspecified shoulder: Secondary | ICD-10-CM | POA: Insufficient documentation

## 2012-09-29 DIAGNOSIS — G2581 Restless legs syndrome: Secondary | ICD-10-CM

## 2012-09-29 DIAGNOSIS — M961 Postlaminectomy syndrome, not elsewhere classified: Secondary | ICD-10-CM

## 2012-09-29 DIAGNOSIS — IMO0002 Reserved for concepts with insufficient information to code with codable children: Secondary | ICD-10-CM

## 2012-09-29 DIAGNOSIS — G8929 Other chronic pain: Secondary | ICD-10-CM

## 2012-09-29 LAB — VITAMIN B12: Vitamin B-12: 621 pg/mL (ref 211–911)

## 2012-09-29 LAB — T4: T4, Total: 8.6 ug/dL (ref 5.0–12.5)

## 2012-09-29 LAB — T3: T3, Total: 62.8 ng/dL — ABNORMAL LOW (ref 80.0–204.0)

## 2012-09-29 LAB — T3, FREE: T3, Free: 2.5 pg/mL (ref 2.3–4.2)

## 2012-09-29 LAB — T4, FREE: Free T4: 1.16 ng/dL (ref 0.80–1.80)

## 2012-09-29 LAB — TSH: TSH: 1.577 u[IU]/mL (ref 0.350–4.500)

## 2012-09-29 MED ORDER — FENTANYL 75 MCG/HR TD PT72: 1.0000 | MEDICATED_PATCH | TRANSDERMAL | Status: DC

## 2012-09-29 MED ORDER — OXCARBAZEPINE 300 MG PO TABS: 300.0000 mg | ORAL_TABLET | Freq: Two times a day (BID) | ORAL | Status: DC

## 2012-09-29 NOTE — Patient Instructions (Signed)
TRY TO AVOID SLEEPING ON YOUR RIGHT SIDE. AVOID HEAVY LIFTING WITH YOUR RIGHT ARM. APPLY ICE TO YOUR RIGHT SHOULDER FOR PAIN RELIEF.

## 2012-09-29 NOTE — Progress Notes (Signed)
Subjective:    Patient ID: Jill Phillips, female    DOB: Jul 21, 1956, 57 y.o.   MRN: [id number]  HPI  Jill Phillips is back regarding her chronic pain. Overall she has been doing fairly well. The trileptal hasn't had much effect on her RLS. She hasn't had any SE with the drug. Her family physician did not order the labs we had discussed. Her knee ROM is slowly improving although she hopes it increases further.   She continues on her fentanyl patch and talwin for pain control.  Pain Inventory Average Pain 5 Pain Right Now 5 My pain is aching  In the last 24 hours, has pain interfered with the following? General activity 8 Relation with others 8 Enjoyment of life 8 What TIME of day is your pain at its worst? morning and day Sleep (in general) Fair  Pain is worse with: some activites Pain improves with: rest and medication Relief from Meds: 7  Mobility Do you have any goals in this area?  no  Function I need assistance with the following:  household duties Do you have any goals in this area?  no  Neuro/Psych spasms  Prior Studies Any changes since last visit?  no  Physicians involved in your care Any changes since last visit?  no   Family History  Problem Relation Age of Onset  . Kidney disease Mother   . Heart disease Father   . Anuerysm Brother 29    brain  . Heart disease Brother   . Heart disease Sister 68    s/p CABG  . Hypertension Sister    History   Social History  . Marital Status: Divorced    Spouse Name: n/a    Number of Children: 2  . Years of Education: 12+   Occupational History  . disability     back surgeries   Social History Main Topics  . Smoking status: Current Every Day Smoker -- 1.5 packs/day for 45 years    Types: Cigarettes  . Smokeless tobacco: Never Used  . Alcohol Use: No  . Drug Use: No  . Sexually Active: Not Currently -- Female partner(s)   Other Topics Concern  . None   Social History Narrative   Lives alone.  One daughter  is local, but is getting ready to move to [place], where her children live with their father.  The other daughter lives near [place], [place].   Past Surgical History  Procedure Date  . Appendectomy   . Abdominal hysterectomy   . Tubal ligation   . Spine surgery     thoracic x 1,  lumbar x 15  . Right hip replacement   . Knee surgeries r knee     arthroscopy- right  . Hammer toe surgery   . Sleep apnea surgery   . Cholecystectomy   . Back surgery   . Joint replacement   . Total knee arthroplasty 05/29/2012    Procedure: TOTAL KNEE ARTHROPLASTY;  Surgeon: Mcarthur Rossetti, MD;  Location: [facility];  Service: Orthopedics;  Laterality: Right;  Right Total Knee Arthroplasty   Past Medical History  Diagnosis Date  . Postlaminectomy syndrome, thoracic region   . Osteoarthrosis, unspecified whether generalized or localized, lower leg   . Dysthymic disorder   . Calcifying tendinitis of shoulder   . Pain in joint, upper arm   . Chronic pain syndrome   . Lumbago   . Primary localized osteoarthrosis, lower leg   . Hypertension   . Hyperlipidemia   .  GERD (gastroesophageal reflux disease)   . Thyroid disease   . Restless leg syndrome   . PONV (postoperative nausea and vomiting)   . Heart murmur   . Sleep apnea     s/p surgery- last sleep study 2011- doesnt use oxygen or machine at night as instructed  . History of blood transfusion 1980  . Hypothyroidism    BP 121/76  Pulse 80  Resp 14  Ht 5\' 4"  (1.626 m)  Wt 145 lb (65.772 kg)  BMI 24.89 kg/m2  SpO2 98%     Review of Systems  Musculoskeletal: Positive for back pain.  All other systems reviewed and are negative.       Objective:   Physical Exam  Constitutional: She is oriented to person, place, and time. She appears well-developed and well-nourished.  HENT:  Head: Normocephalic.  Eyes: EOM are normal. Pupils are equal, round, and reactive to light.  Neck: Normal range of motion.  Cardiovascular: Normal rate.    Pulmonary/Chest: Effort normal.  Abdominal: Soft.  Musculoskeletal:  Right knee with minimal tenderness. Post-op scars noted. Knee rom 120+ with flexion. Weight bearing better with less antalgia seen.  Right shoulder with mild tenderness with ROM. No shoulder instability noted. hadd full AROM.  Generalized tenderness of c-spine with some limitation in ROM present  Neurological: She is alert and oriented to person, place, and time.  Skin: Skin is warm.  Psychiatric: She has a normal mood and affect. Her behavior is normal. Judgment and thought content normal.  Assessment & Plan:   ASSESSMENT:  1. Chronic lumbar spine pain/post-lami syndrome  2. Osteoarthritis of the knees bilaterally, right greater than left.  3. Rotator cuff syndrome/subacromial bursitis.  4. Restless legs syndrome.  5. O2 dependent sleep apnea.  6. Tobacco abuse.    PLAN:  1. Discussed appropriate sleeping habits, ice to right shoulder.. 2. We will continue with Klonopin for now--consider day time dosing.  3. Continue to titrate trileptal up to 300mg  bid. Can increase to TID if needed. 4. Will order TFT's, vitamin D level, B12 which also could be impacting pain.  5. Refilled Talwin NX 1 q.8 hours p.r.n. #60 and Duragesic at 75 mcg 1  q.72h hours #10 each without a refill.  6. Follow up with my PA in one month.

## 2012-10-01 LAB — VITAMIN D 1,25 DIHYDROXY
Vitamin D 1, 25 (OH)2 Total: 27 pg/mL (ref 18–72)
Vitamin D2 1, 25 (OH)2: <8 pg/mL
Vitamin D3 1, 25 (OH)2: 27 pg/mL

## 2012-10-02 ENCOUNTER — Telehealth: Payer: Self-pay | Admitting: Physical Medicine & Rehabilitation

## 2012-10-02 NOTE — Telephone Encounter (Signed)
Notified Manuelita of her levels. She takes thyroid medication and has someone who prescribes.  I told her she may want to follow up with him about the levels.

## 2012-10-02 NOTE — Telephone Encounter (Signed)
Left message for patient to call office regarding lab results.

## 2012-10-02 NOTE — Telephone Encounter (Signed)
Her thyroid studies are borderline low. i'm not inclined to treat at this point. Would recheck in a few months and if persistently low we can potentially rx

## 2012-10-23 ENCOUNTER — Other Ambulatory Visit: Payer: Self-pay

## 2012-10-23 DIAGNOSIS — M75101 Unspecified rotator cuff tear or rupture of right shoulder, not specified as traumatic: Secondary | ICD-10-CM

## 2012-10-23 DIAGNOSIS — M170 Bilateral primary osteoarthritis of knee: Secondary | ICD-10-CM

## 2012-10-23 DIAGNOSIS — G2581 Restless legs syndrome: Secondary | ICD-10-CM

## 2012-10-23 DIAGNOSIS — M961 Postlaminectomy syndrome, not elsewhere classified: Secondary | ICD-10-CM

## 2012-10-23 MED ORDER — PENTAZOCINE-NALOXONE 50-0.5 MG PO TABS: 1.0000 | ORAL_TABLET | Freq: Three times a day (TID) | ORAL | Status: DC | PRN

## 2012-10-29 ENCOUNTER — Encounter
Payer: PRIVATE HEALTH INSURANCE | Attending: Physical Medicine and Rehabilitation | Admitting: Physical Medicine and Rehabilitation

## 2012-10-29 ENCOUNTER — Encounter: Payer: Self-pay | Admitting: Physical Medicine and Rehabilitation

## 2012-10-29 VITALS — BP 135/84 | HR 89 | Resp 14 | Ht 64.0 in | Wt 145.0 lb

## 2012-10-29 DIAGNOSIS — Z96659 Presence of unspecified artificial knee joint: Secondary | ICD-10-CM | POA: Insufficient documentation

## 2012-10-29 DIAGNOSIS — M25569 Pain in unspecified knee: Secondary | ICD-10-CM

## 2012-10-29 DIAGNOSIS — M961 Postlaminectomy syndrome, not elsewhere classified: Secondary | ICD-10-CM

## 2012-10-29 DIAGNOSIS — M545 Low back pain, unspecified: Secondary | ICD-10-CM | POA: Insufficient documentation

## 2012-10-29 DIAGNOSIS — I10 Essential (primary) hypertension: Secondary | ICD-10-CM | POA: Insufficient documentation

## 2012-10-29 DIAGNOSIS — M75101 Unspecified rotator cuff tear or rupture of right shoulder, not specified as traumatic: Secondary | ICD-10-CM

## 2012-10-29 DIAGNOSIS — G4730 Sleep apnea, unspecified: Secondary | ICD-10-CM | POA: Insufficient documentation

## 2012-10-29 DIAGNOSIS — M170 Bilateral primary osteoarthritis of knee: Secondary | ICD-10-CM

## 2012-10-29 DIAGNOSIS — G8929 Other chronic pain: Secondary | ICD-10-CM | POA: Insufficient documentation

## 2012-10-29 DIAGNOSIS — Z8669 Personal history of other diseases of the nervous system and sense organs: Secondary | ICD-10-CM

## 2012-10-29 DIAGNOSIS — E785 Hyperlipidemia, unspecified: Secondary | ICD-10-CM | POA: Insufficient documentation

## 2012-10-29 DIAGNOSIS — Z5181 Encounter for therapeutic drug level monitoring: Secondary | ICD-10-CM

## 2012-10-29 DIAGNOSIS — M67919 Unspecified disorder of synovium and tendon, unspecified shoulder: Secondary | ICD-10-CM

## 2012-10-29 DIAGNOSIS — M1710 Unilateral primary osteoarthritis, unspecified knee: Secondary | ICD-10-CM | POA: Insufficient documentation

## 2012-10-29 DIAGNOSIS — G2581 Restless legs syndrome: Secondary | ICD-10-CM

## 2012-10-29 DIAGNOSIS — Z87898 Personal history of other specified conditions: Secondary | ICD-10-CM

## 2012-10-29 DIAGNOSIS — K219 Gastro-esophageal reflux disease without esophagitis: Secondary | ICD-10-CM | POA: Insufficient documentation

## 2012-10-29 DIAGNOSIS — M25559 Pain in unspecified hip: Secondary | ICD-10-CM | POA: Insufficient documentation

## 2012-10-29 DIAGNOSIS — IMO0002 Reserved for concepts with insufficient information to code with codable children: Secondary | ICD-10-CM

## 2012-10-29 DIAGNOSIS — E039 Hypothyroidism, unspecified: Secondary | ICD-10-CM | POA: Insufficient documentation

## 2012-10-29 MED ORDER — FENTANYL 75 MCG/HR TD PT72: 1.0000 | MEDICATED_PATCH | TRANSDERMAL | Status: DC

## 2012-10-29 NOTE — Patient Instructions (Signed)
Continue with staying as active as tolerated.Try to look into exercising in the pool.

## 2012-10-29 NOTE — Progress Notes (Signed)
Subjective:    Patient ID: Jill Phillips, female    DOB: December 21, 1955, 57 y.o.   MRN: [id number]  HPI The patient complains about chronic LBP , and restless leg syndrome bilateral .   The problem has gotten worse.Patient is a little frustrated, she states, that she tried different medication for her RLS, but nothing seemed to work.  Pain Inventory Average Pain 6 Pain Right Now 6 My pain is dull  In the last 24 hours, has pain interfered with the following? General activity 4 Relation with others 4 Enjoyment of life 4 What TIME of day is your pain at its worst? evening Sleep (in general) Fair  Pain is worse with: walking, bending and some activites Pain improves with: medication Relief from Meds: 8  Mobility ability to climb steps?  yes do you drive?  yes Do you have any goals in this area?  yes  Function I need assistance with the following:  household duties and shopping Do you have any goals in this area?  no  Neuro/Psych No problems in this area  Prior Studies Any changes since last visit?  no  Physicians involved in your care Any changes since last visit?  no   Family History  Problem Relation Age of Onset  . Kidney disease Mother   . Heart disease Father   . Anuerysm Brother 29    brain  . Heart disease Brother   . Heart disease Sister 22    s/p CABG  . Hypertension Sister    History   Social History  . Marital Status: Divorced    Spouse Name: n/a    Number of Children: 2  . Years of Education: 12+   Occupational History  . disability     back surgeries   Social History Main Topics  . Smoking status: Current Every Day Smoker -- 1.50 packs/day for 45 years    Types: Cigarettes  . Smokeless tobacco: Never Used  . Alcohol Use: No  . Drug Use: No  . Sexually Active: Not Currently -- Female partner(s)   Other Topics Concern  . None   Social History Narrative   Lives alone.  One daughter is local, but is getting ready to move to [place],  where her children live with their father.  The other daughter lives near [place], [place].   Past Surgical History  Procedure Laterality Date  . Appendectomy    . Abdominal hysterectomy    . Tubal ligation    . Spine surgery      thoracic x 1,  lumbar x 15  . Right hip replacement    . Knee surgeries r knee      arthroscopy- right  . Hammer toe surgery    . Sleep apnea surgery    . Cholecystectomy    . Back surgery    . Joint replacement    . Total knee arthroplasty  05/29/2012    Procedure: TOTAL KNEE ARTHROPLASTY;  Surgeon: Mcarthur Rossetti, MD;  Location: [facility];  Service: Orthopedics;  Laterality: Right;  Right Total Knee Arthroplasty   Past Medical History  Diagnosis Date  . Postlaminectomy syndrome, thoracic region   . Osteoarthrosis, unspecified whether generalized or localized, lower leg   . Dysthymic disorder   . Calcifying tendinitis of shoulder   . Pain in joint, upper arm   . Chronic pain syndrome   . Lumbago   . Primary localized osteoarthrosis, lower leg   . Hypertension   . Hyperlipidemia   .  GERD (gastroesophageal reflux disease)   . Thyroid disease   . Restless leg syndrome   . PONV (postoperative nausea and vomiting)   . Heart murmur   . Sleep apnea     s/p surgery- last sleep study 2011- doesnt use oxygen or machine at night as instructed  . History of blood transfusion 1980  . Hypothyroidism    BP 135/84  Pulse 89  Resp 14  Ht 5\' 4"  (1.626 m)  Wt 145 lb (65.772 kg)  BMI 24.88 kg/m2  SpO2 92%   ep  Review of Systems  Musculoskeletal: Positive for back pain.  All other systems reviewed and are negative.       Objective:   Physical Exam Constitutional: She is oriented to person, place, and time. She appears well-developed and well-nourished.  HENT:  Head: Normocephalic.  Musculoskeletal: She exhibits tenderness.  Neurological: She is alert and oriented to person, place, and time.  Skin: Skin is warm and dry.  Psychiatric: She has a  normal mood and affect.  Symmetric normal motor tone is noted throughout. Normal muscle bulk. Muscle testing reveals 5/5 muscle strength of the upper extremity, and 5/5 of the lower extremity. Full range of motion in upper and lower extremities, except decrease ROM in right knee, extension deficit. ROM of spine is restricted. Fine motor movements are normal in both hands.  DTR in the upper and lower extremity are present and symmetric 2+, except left patella reflex 0. No clonus is noted.  Patient arises from chair with mild difficulty. Narrow based gait with normal arm swing bilateral.        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 Ninfa Linden, 05/29/2012, right knee is almost pain free. 2.Post laminectomy syndrome  3.restless leg syndrome  4. Bilateral hip pain  5. Hx of Sleep Apnea, patient is not using a C-PAP, has some symptoms of sleep apnea. Plan :  1. Discussed appropriate sleeping habits, ice to right shoulder..  2. We will continue with Klonopin for now--consider day time dosing.  3. Continue to titrate trileptal up to 300mg  bid. Is not helping at all  4.  TFT's, vitamin D level, B12 which also could be impacting pain, B12 , D3 was appropriate, total T3 was decreased, otherwise test without significant findings.  5. Refilled Fentanyl Duragesic at 75 mcg 1  6. Referral to neurology for evaluation of RLS and sleep apnea. q.72h hours #10 each without a refill.  6. Follow up with my PA in one month.   Advised pateint to continue with staying as active as possible, also she should look into aquatic exercises.  Follow up in 1 month.

## 2012-11-10 ENCOUNTER — Telehealth: Payer: Self-pay

## 2012-11-10 NOTE — Telephone Encounter (Signed)
Patient called with concerns about her medications.  She has not had her pain pill since December due to insurance.  She does not take triliptel.  She needs her klonopin directions changed because she goes through withdrawls each month because its only written for 2 at bedtime.  Also her patches only last about 2.5 days then she starts to suffer with restless leg syndrome.  Please advise.

## 2012-11-11 ENCOUNTER — Encounter
Payer: PRIVATE HEALTH INSURANCE | Attending: Physical Medicine & Rehabilitation | Admitting: Physical Medicine & Rehabilitation

## 2012-11-11 ENCOUNTER — Encounter: Payer: Self-pay | Admitting: Physical Medicine & Rehabilitation

## 2012-11-11 VITALS — BP 129/82 | HR 95 | Resp 14 | Ht 64.0 in | Wt 147.0 lb

## 2012-11-11 DIAGNOSIS — IMO0002 Reserved for concepts with insufficient information to code with codable children: Secondary | ICD-10-CM | POA: Insufficient documentation

## 2012-11-11 DIAGNOSIS — G2581 Restless legs syndrome: Secondary | ICD-10-CM

## 2012-11-11 DIAGNOSIS — M170 Bilateral primary osteoarthritis of knee: Secondary | ICD-10-CM

## 2012-11-11 DIAGNOSIS — M67919 Unspecified disorder of synovium and tendon, unspecified shoulder: Secondary | ICD-10-CM

## 2012-11-11 DIAGNOSIS — M719 Bursopathy, unspecified: Secondary | ICD-10-CM | POA: Insufficient documentation

## 2012-11-11 DIAGNOSIS — M75101 Unspecified rotator cuff tear or rupture of right shoulder, not specified as traumatic: Secondary | ICD-10-CM

## 2012-11-11 DIAGNOSIS — M961 Postlaminectomy syndrome, not elsewhere classified: Secondary | ICD-10-CM | POA: Insufficient documentation

## 2012-11-11 MED ORDER — FENTANYL 75 MCG/HR TD PT72: 1.0000 | MEDICATED_PATCH | TRANSDERMAL | Status: DC

## 2012-11-11 MED ORDER — CLONAZEPAM 2 MG PO TABS: 4.0000 mg | ORAL_TABLET | Freq: Every day | ORAL | Status: DC

## 2012-11-11 MED ORDER — PENTAZOCINE-NALOXONE 50-0.5 MG PO TABS: 1.0000 | ORAL_TABLET | Freq: Three times a day (TID) | ORAL | Status: DC | PRN

## 2012-11-11 NOTE — Telephone Encounter (Signed)
Patient will make an appointment to discuss medication concerns.

## 2012-11-11 NOTE — Patient Instructions (Signed)
TRY TAKING ONE TO TWO OF THE TRILEPTAL DURING THE DAY IF NEEDED FOR YOUR RLS.

## 2012-11-11 NOTE — Progress Notes (Signed)
Subjective:    Patient ID: Jill Phillips, female    DOB: [birth year], 57 y.o.   MRN: [id number]  HPI  Jill Phillips is back regarding her multiple pain issues. Her knee has been doing well. Her biggest complaint is her RLS which affects her sleep. She feels that the klonopin works the best, although it doesn't completely hold her through the night.  She has had some issues with her medications and rx'es. We are still waiting on authorization of her talwin.   She feels that her RLS was better on the 114mcg fentanyl and asks me again today if that's an option.    Pain Inventory Average Pain 5 Pain Right Now 5 My pain is dull and aching  In the last 24 hours, has pain interfered with the following? General activity 8 Relation with others 8 Enjoyment of life 8 What TIME of day is your pain at its worst? day and night Sleep (in general) Fair  Pain is worse with: walking, sitting and some activites Pain improves with: rest and medication Relief from Meds: 6  Mobility walk without assistance how many minutes can you walk? 30 ability to climb steps?  yes do you drive?  yes transfers alone Do you have any goals in this area?  no  Function disabled: date disabled see chart Do you have any goals in this area?  no  Neuro/Psych No problems in this area  Prior Studies Any changes since last visit?  no  Physicians involved in your care Any changes since last visit?  no   Family History  Problem Relation Age of Onset  . Kidney disease Mother   . Heart disease Father   . Anuerysm Brother 29    brain  . Heart disease Brother   . Heart disease Sister 53    s/p CABG  . Hypertension Sister    History   Social History  . Marital Status: Divorced    Spouse Name: n/a    Number of Children: 2  . Years of Education: 12+   Occupational History  . disability     back surgeries   Social History Main Topics  . Smoking status: Current Every Day Smoker -- 1.50 packs/day for 45  years    Types: Cigarettes  . Smokeless tobacco: Never Used  . Alcohol Use: No  . Drug Use: No  . Sexually Active: Not Currently -- Female partner(s)   Other Topics Concern  . None   Social History Narrative   Lives alone.  One daughter is local, but is getting ready to move to [place], where her children live with their father.  The other daughter lives near [place], [place].   Past Surgical History  Procedure Laterality Date  . Appendectomy    . Abdominal hysterectomy    . Tubal ligation    . Spine surgery      thoracic x 1,  lumbar x 15  . Right hip replacement    . Knee surgeries r knee      arthroscopy- right  . Hammer toe surgery    . Sleep apnea surgery    . Cholecystectomy    . Back surgery    . Joint replacement    . Total knee arthroplasty  05/29/2012    Procedure: TOTAL KNEE ARTHROPLASTY;  Surgeon: Mcarthur Rossetti, MD;  Location: [facility];  Service: Orthopedics;  Laterality: Right;  Right Total Knee Arthroplasty   Past Medical History  Diagnosis Date  . Postlaminectomy syndrome,  thoracic region   . Osteoarthrosis, unspecified whether generalized or localized, lower leg   . Dysthymic disorder   . Calcifying tendinitis of shoulder   . Pain in joint, upper arm   . Chronic pain syndrome   . Lumbago   . Primary localized osteoarthrosis, lower leg   . Hypertension   . Hyperlipidemia   . GERD (gastroesophageal reflux disease)   . Thyroid disease   . Restless leg syndrome   . PONV (postoperative nausea and vomiting)   . Heart murmur   . Sleep apnea     s/p surgery- last sleep study 2011- doesnt use oxygen or machine at night as instructed  . History of blood transfusion 1980  . Hypothyroidism    BP 129/82  Pulse 95  Resp 14  Ht 5\' 4"  (1.626 m)  Wt 147 lb (66.679 kg)  BMI 25.22 kg/m2  SpO2 91%     Review of Systems  Gastrointestinal: Positive for constipation.  Musculoskeletal: Positive for back pain.  All other systems reviewed and are  negative.       Objective:   Physical Exam Constitutional: She is oriented to person, place, and time. She appears well-developed and well-nourished.  HENT:  Head: Normocephalic.  Eyes: EOM are normal. Pupils are equal, round, and reactive to light.  Neck: Normal range of motion.  Cardiovascular: Normal rate.  Pulmonary/Chest: Effort normal.  Abdominal: Soft.  Musculoskeletal:  Right knee with minimal tenderness. Post-op scars noted. Knee rom 120+ with flexion. Weight bearing better with minimal antalgia seen.  Right shoulder with mild tenderness with ROM. No shoulder instability noted. had full AROM.  Generalized tenderness of c-spine with some limitation in ROM present Neurological: She is alert and oriented to person, place, and time.  Skin: Skin is warm.  Psychiatric: She has a normal mood and affect. Her behavior is normal. Judgment and thought content normal.    Assessment & Plan:   ASSESSMENT:  1. Chronic lumbar spine pain/post-lami syndrome  2. Osteoarthritis of the knees bilaterally, right greater than left.  3. Rotator cuff syndrome/subacromial bursitis.  4. Restless legs syndrome.  5. O2 dependent sleep apnea.  6. Tobacco abuse.   PLAN:  1. Discussed appropriate sleeping habits, ice to right shoulder..  2. Will titrate klonopin to 4mg  qhs for RLS. Consider mysoline trial or requip retrial. 3. Can use trileptal during the day for any daytime RLS.  4. Reviewed opioid hypersensitivity  5. Refilled Talwin NX 1 q.8 hours p.r.n. #60 and Duragesic at 75 mcg 1  q.72h hours #10 each without a refill. Prior auth was saught on the talwin 6. Follow up with me in one month.

## 2012-11-17 ENCOUNTER — Institutional Professional Consult (permissible substitution): Payer: Medicare HMO | Admitting: Neurology

## 2012-11-18 ENCOUNTER — Other Ambulatory Visit: Payer: Self-pay | Admitting: Family Medicine

## 2012-11-18 ENCOUNTER — Telehealth: Payer: Self-pay | Admitting: Family Medicine

## 2012-11-18 MED ORDER — LEVOTHYROXINE SODIUM 25 MCG PO TABS: 25.0000 ug | ORAL_TABLET | Freq: Every day | ORAL | Status: DC

## 2012-11-18 NOTE — Telephone Encounter (Signed)
Done

## 2012-11-20 ENCOUNTER — Telehealth: Payer: Self-pay | Admitting: *Deleted

## 2012-11-20 NOTE — Telephone Encounter (Signed)
Left message for patient to call office regarding her prior authorization for talwin.  This has been appealed and we are waiting on results.

## 2012-11-20 NOTE — Telephone Encounter (Signed)
Calling to inquire about the status of her Talwin rx PA. (It has been escalated to an appeal).  Also she was not able to make her appt for her sleep study due to snow last week and she has not been able to get through to them to reschedule the appt.  She is wondering if we can do that for her.

## 2012-11-23 ENCOUNTER — Telehealth: Payer: Self-pay | Admitting: Family Medicine

## 2012-11-23 MED ORDER — PRAVASTATIN SODIUM 20 MG PO TABS: 20.0000 mg | ORAL_TABLET | Freq: Every day | ORAL | Status: DC

## 2012-11-23 NOTE — Telephone Encounter (Signed)
rx refilled.

## 2012-11-24 ENCOUNTER — Other Ambulatory Visit: Payer: Self-pay

## 2012-11-24 DIAGNOSIS — G2581 Restless legs syndrome: Secondary | ICD-10-CM

## 2012-11-24 MED ORDER — CLONAZEPAM 2 MG PO TABS: 4.0000 mg | ORAL_TABLET | Freq: Every day | ORAL | Status: DC

## 2012-11-24 MED ORDER — CARISOPRODOL 350 MG PO TABS: ORAL_TABLET | ORAL | Status: DC

## 2012-11-24 MED ORDER — DICLOFENAC SODIUM 1 % TD GEL: TRANSDERMAL | Status: DC

## 2012-11-25 ENCOUNTER — Ambulatory Visit: Payer: PRIVATE HEALTH INSURANCE | Admitting: Physical Medicine and Rehabilitation

## 2012-11-25 ENCOUNTER — Telehealth: Payer: Self-pay

## 2012-11-25 NOTE — Telephone Encounter (Signed)
Patient informed to try iron supplement at bed time.  Patient is going to make an appointment to discuss changing patch directions.

## 2012-11-25 NOTE — Telephone Encounter (Signed)
Try an iron supplement at night. OTC. One qhs

## 2012-11-25 NOTE — Telephone Encounter (Signed)
Patient called complaining leg pain all night.  She has not gotten her talwin approved but says she was not taking it but maybe once a day.  Patient would like to know what to do for the leg pain at night.

## 2012-11-27 ENCOUNTER — Encounter: Payer: PRIVATE HEALTH INSURANCE | Admitting: Physical Medicine and Rehabilitation

## 2012-12-03 ENCOUNTER — Ambulatory Visit (INDEPENDENT_AMBULATORY_CARE_PROVIDER_SITE_OTHER): Payer: Medicare Other | Admitting: Neurology

## 2012-12-03 VITALS — BP 133/82 | HR 84 | Temp 98.2°F | Ht 64.0 in | Wt 146.0 lb

## 2012-12-03 DIAGNOSIS — R0989 Other specified symptoms and signs involving the circulatory and respiratory systems: Secondary | ICD-10-CM

## 2012-12-03 DIAGNOSIS — R0683 Snoring: Secondary | ICD-10-CM

## 2012-12-03 DIAGNOSIS — R0902 Hypoxemia: Secondary | ICD-10-CM

## 2012-12-03 DIAGNOSIS — G2581 Restless legs syndrome: Secondary | ICD-10-CM

## 2012-12-03 DIAGNOSIS — R0609 Other forms of dyspnea: Secondary | ICD-10-CM

## 2012-12-03 DIAGNOSIS — G4710 Hypersomnia, unspecified: Secondary | ICD-10-CM

## 2012-12-03 DIAGNOSIS — F17200 Nicotine dependence, unspecified, uncomplicated: Secondary | ICD-10-CM

## 2012-12-03 NOTE — Patient Instructions (Addendum)
We are going to do another sleep test to look into the possibility of narcolepsy. I am not recommending any changes to your meds. Please stop smoking!

## 2012-12-03 NOTE — Progress Notes (Signed)
Subjective:    Patient ID: Jill Phillips is a 57 y.o. female.  HPI Jill Age, MD, PhD [facility] [address] [place], [place] [address]  Dear Dr. Naaman Plummer,   I saw your patient, Jill Phillips, upon your kind request in my neurologic clinic today for initial consultation of her sleep disorder. The patient is unaccompanied today. As you know, Jill Phillips is a very friendly 57 year old right-handed woman with a complex medical history of severe orthopedic problems with scoliosis, status post many back surgeries, right hip and right knee replacement surgeries, chronic pain syndrome, long-standing history of reflux disease, thyroid disease, hyperlipidemia, hypertension, who presents for evaluation of several sleep related issues. She previously was diagnosed with obstructive sleep apnea and had in fact seen my colleague, Dr. Brett Fairy in 2007. Many years ago she was diagnosed with obstructive sleep apnea and was issued a CPAP machine which she used for some time but not in over 5 years. She brought in her old machine which I looked at them it looks like the pressure was only 4 cm of water. She has been following you for her chronic pain including pain related to restless legs syndrome. She describes a severe pain that mostly starts below her knees and she has been on fentanyl patches for the past 7-8 years as I understand. She more recently had a sleep study in 2012 which showed no significant obstructive sleep apnea. It did not show any significant period leg movements of sleep. She had an average oxygen saturation around 88% and was deepened supplemental oxygen via nasal cannula at 1.5 L per minute. She was also prescribed oxygen for home use but has not been using it. She reports that the fentanyl only lasts for 48 hours rather than 72 hours. She has been noticing a significant degree of sleepiness in the past few months. She can sleep more than 10 hours a day at  times. Her bedtime typically is around 8:30 and she falls asleep within 5-10 minutes. She does not have much in the way of middle of the night awakenings and goes to the bathroom perhaps twice per night on an average. Her usual time to wake up very is and can be between 5 and 8 AM and she may go back to sleep in the early morning depending on when she woke up. She does take almost daily nap at this time. It may be as long as a few hours. Sometimes she only wakes up from a nap when somebody calls her or is at the door. She does report tingling in her naps and has vivid dreams as well. She used to sleepwalk as a younger adult and child but not recently and denies any sleep talking or other parasomnias or dream enactments. She has been reported to take and moved quite a bit in her sleep by family members. She has been a chronic smoker for many years. She currently smokes 1-1/2 packs per day. She reports a family history of restless leg syndrome affecting her niece is and her daughter. Many years ago, at least over 5 years ago she also had a sleep study along with a nap study and states that she fell asleep in each and every nap. I do not have that sleep study evaluation available for review at this time. She does not recall where the sleep study was done. She has undergone sleep apnea surgery and has had a tonsillectomy in the past as well.  Her main complaint today is her sleepiness. She denies any cataplexy, sleep paralysis or hypnagogic or hypnopompic hallucinations. There is no family history of narcolepsy.   Her Past Medical History Is Significant For: Past Medical History  Diagnosis Date  . Postlaminectomy syndrome, thoracic region   . Osteoarthrosis, unspecified whether generalized or localized, lower leg   . Dysthymic disorder   . Calcifying tendinitis of shoulder   . Pain in joint, upper arm   . Chronic pain syndrome   . Lumbago   . Primary localized osteoarthrosis, lower leg   . Hypertension   .  Hyperlipidemia   . GERD (gastroesophageal reflux disease)   . Thyroid disease   . Restless leg syndrome   . PONV (postoperative nausea and vomiting)   . Heart murmur   . Sleep apnea     s/p surgery- last sleep study 2011- doesnt use oxygen or machine at night as instructed  . History of blood transfusion 1980  . Hypothyroidism     Her Past Surgical History Is Significant For: Past Surgical History  Procedure Laterality Date  . Appendectomy    . Abdominal hysterectomy    . Tubal ligation    . Spine surgery      thoracic x 1,  lumbar x 15  . Right hip replacement    . Knee surgeries r knee      arthroscopy- right  . Hammer toe surgery    . Sleep apnea surgery    . Cholecystectomy    . Back surgery    . Joint replacement    . Total knee arthroplasty  05/29/2012    Procedure: TOTAL KNEE ARTHROPLASTY;  Surgeon: Mcarthur Rossetti, MD;  Location: [facility];  Service: Orthopedics;  Laterality: Right;  Right Total Knee Arthroplasty    Her Family History Is Significant For: Family History  Problem Relation Phillips of Onset  . Kidney disease Mother   . Heart disease Father   . Anuerysm Brother 29    brain  . Heart disease Brother   . Heart disease Sister 53    s/p CABG  . Hypertension Sister     Her Social History Is Significant For: History   Social History  . Marital Status: Divorced    Spouse Name: n/a    Number of Children: 2  . Years of Education: 12+   Occupational History  . disability     back surgeries   Social History Main Topics  . Smoking status: Current Every Day Smoker -- 1.50 packs/day for 45 years    Types: Cigarettes  . Smokeless tobacco: Never Used  . Alcohol Use: No  . Drug Use: No  . Sexually Active: Not Currently -- Female partner(s)   Other Topics Concern  . Not on file   Social History Narrative   Lives alone.  One daughter is local, but is getting ready to move to [place], where her children live with their father.  The other daughter  lives near [place], [place].    Her Allergies Are:  Allergies  Allergen Reactions  . Amoxicillin     Oral yeast infection  . Chlorzoxazone     headache  . Codeine     headache  . Darvocet (Propoxyphene-Acetaminophen) Itching  . Dilaudid (Hydromorphone Hcl) Itching  . Morphine And Related Itching  . Nitrofurantoin Monohyd Macro Hives  . Oxycodone-Acetaminophen Itching  . Percocet (Oxycodone-Acetaminophen) Itching  . Sulfa Antibiotics     Yeast infection in mouth  :  Her Current Medications Are:  Outpatient Encounter Prescriptions as of 12/03/2012  Medication Sig Dispense Refill  . acetaminophen (TYLENOL) 500 MG tablet Take 1,000 mg by mouth every 6 (six) hours as needed. For pain      . ALPRAZolam (XANAX) 1 MG tablet Take 1 mg by mouth daily as needed. For nerves      . amLODipine (NORVASC) 5 MG tablet Take 5 mg by mouth at bedtime.       . benazepril (LOTENSIN) 20 MG tablet TAKE 1 TABLET BY MOUTH ONCE A DAY  30 tablet  0  . carisoprodol (SOMA) 350 MG tablet TAKE 1 TABLET BY MOUTH EVERY 8 HOURS AS NEEDED.  90 tablet  3  . clonazePAM (KLONOPIN) 2 MG tablet Take 2 tablets (4 mg total) by mouth at bedtime.  60 tablet  3  . cloNIDine (CATAPRES) 0.1 MG tablet TAKE 1 TABLET BY MOUTH 3 TIMES A DAY.  90 tablet  0  . diclofenac sodium (VOLTAREN) 1 % GEL APPLY 1 APPLICATION TOPICALLY 3 TIMES DAILY.  300 g  3  . fentaNYL (DURAGESIC - DOSED MCG/HR) 75 MCG/HR Place 1 patch (75 mcg total) onto the skin every 3 (three) days.  10 patch  0  . levothyroxine (SYNTHROID, LEVOTHROID) 25 MCG tablet Take 1 tablet (25 mcg total) by mouth at bedtime.  30 tablet  0  . naproxen (NAPROSYN) 500 MG tablet       . omeprazole (PRILOSEC) 20 MG capsule TAKE 1 CAPSULE BY MOUTH ONCE DAILY.  30 capsule  0  . OVER THE COUNTER MEDICATION Take 1 tablet by mouth daily. vegetables laxative      . Oxcarbazepine (TRILEPTAL) 300 MG tablet       . pravastatin (PRAVACHOL) 20 MG tablet Take 1 tablet (20 mg total) by mouth daily.   30 tablet  5  . promethazine (PHENERGAN) 25 MG tablet Take 25 mg by mouth daily as needed. For nausea      . valACYclovir (VALTREX) 500 MG tablet TAKE 1 TABLET BY MOUTH ONCE A DAY  30 tablet  0  . venlafaxine (EFFEXOR-XR) 150 MG 24 hr capsule Take 300 mg by mouth at bedtime.       [name] ESZOPICLONE 3 MG tablet       . hydrochlorothiazide (HYDRODIURIL) 12.5 MG tablet Take 12.5 mg by mouth at bedtime.       . hydrochlorothiazide (MICROZIDE) 12.5 MG capsule TAKE 1 CAPSULE BY MOUTH ONCE A DAY  30 capsule  0  . pentazocine-naloxone (TALWIN NX) 50-0.5 MG per tablet Take 1 tablet by mouth every 8 (eight) hours as needed. For pain  60 tablet  0   No facility-administered encounter medications on file as of 12/03/2012.  :  Review of Systems  Constitutional: Positive for appetite change.  HENT: Positive for rhinorrhea.   Cardiovascular:       Murmur   Neurological:       Restles leg  Psychiatric/Behavioral:       Too much sleep    Objective:  Neurologic Exam  Physical Exam Physical Examination:   Filed Vitals:   12/03/12 1117  BP: 133/82  Pulse: 84  Temp: 98.2 F (36.8 C)    General Examination: The patient is a very pleasant 57 y.o. female in no acute distress.  HEENT: Normocephalic, atraumatic, pupils are equal, round and reactive to light and accommodation. Funduscopic exam is normal with sharp disc margins noted. Extraocular tracking is good without nystagmus noted. Normal smooth pursuit  is noted. Hearing is grossly intact. Tympanic membranes are clear bilaterally. Face is symmetric with normal facial animation and normal facial sensation. Speech is clear with no dysarthria noted. There is no hypophonia. There is no lip, neck or jaw tremor. Neck is supple with full range of motion. There are no carotid bruits on auscultation. Oropharynx exam reveals normal findings. No significant airway crowding is noted. Mallampati is class I. Uvula and tonsils are absent.  Chest: is clear to  auscultation without wheezing, rhonchi or crackles noted.  Heart: sounds are regular and normal without murmurs, rubs or gallops noted.   Abdomen: is soft, non-tender and non-distended with normal bowel sounds appreciated on auscultation.  Extremities: There is no pitting edema in the distal lower extremities bilaterally.   Skin: is warm and dry with no trophic changes noted.  Musculoskeletal: exam reveals: She has an abnormal posture or with significant kyphosis noted in the lower lumbar region and scoliosis, she leans to the right as well.  Neurologically:  Mental status: The patient is awake, alert and oriented in all 4 spheres. Her memory, attention, language and knowledge are appropriate. There is no aphasia, agnosia, apraxia or anomia. Speech is clear with normal prosody and enunciation. Thought process is linear. Mood is congruent and affect is normal.   Cranial nerves are as described above under HEENT exam. In addition, shoulder shrug is normal with equal shoulder height noted. Motor exam: Normal bulk, strength and tone is noted. There is no drift, tremor or rebound. Romberg is negative. Reflexes are 2+ throughout. Toes are downgoing bilaterally. Fine motor skills are intact with normal finger taps, normal hand movements, normal rapid alternating patting, normal foot taps and normal foot agility.  Cerebellar testing shows no dysmetria or intention tremor on finger to nose testing. Heel to shin is unremarkable. There is no truncal or gait ataxia.  Sensory exam is intact to light touch, pinprick, vibration, temperature sense in the upper and lower extremities.  Gait, station and balance are significant for abnormal posture with his stoop and inability to straighten up completely. She leans to the right, rather, her upper body is tilted to the right. She walks with difficulty and a limp. She is briefly able to pull himself up on her toes and heels and tandem walk is difficult for  her.  Assessment and Plan:    Assessment and Plan:  In summary, JAVETTE KUENNEN is a very pleasant 57 y.o.-year old female with a complicated underlying medical history but also a complex sleep related history. At this juncture she is indicating severe sleepiness. By the way her Epworth sleepiness score is 18 out of a maximum of 24 today. I am wondering whether she has idiopathic hypersomnolence versus narcolepsy without cataplexy, given that she describes dreaming and her naps during the day. Her sleep study from 2012 did not indicate any obstructive sleep disordered breathing, nevertheless she was found to be hypoxemic. This may be due to her chronic smoking and she may need to be evaluated by a pulmonologist as well. She has a long-standing history of restless leg symptoms for which she is on chronic narcotic pain medicine at this time. I suggested reevaluating her with the sleep studies to help delineate the sleepiness better. In order to do that I would like for her to come back for a sleep study followed by an MSLT. The patient was in agreement. I did not make any changes to her medications and suggested that she follow up with you  in that regard. I counseled her at length about smoking cessation and about maintaining a healthy lifestyle in general. I answered all her questions today and the patient  was in agreement with the plan. I would like to see her back after sleep testing is completed. Thank you very much for allowing me to participate in the care of this nice patient. If I can be of any further assistance to you please do not hesitate to call me at [phone].  Sincerely,   Jill Age, MD, PhD

## 2012-12-04 ENCOUNTER — Encounter: Payer: Self-pay | Admitting: Neurology

## 2012-12-07 ENCOUNTER — Telehealth: Payer: Self-pay

## 2012-12-07 NOTE — Telephone Encounter (Signed)
Patient says she still has not gotten talwin and is having trouble with back pain.  She was wondering if she could get vicodin.  She says she has had this before and it didn't cause her much trouble (made her jump in her sleep).  Please advise.

## 2012-12-08 MED ORDER — HYDROCODONE-ACETAMINOPHEN 5-325 MG PO TABS: 1.0000 | ORAL_TABLET | Freq: Four times a day (QID) | ORAL | Status: DC | PRN

## 2012-12-08 NOTE — Telephone Encounter (Signed)
OK. Multiple allergy warnings for hydrocodone but since pt denies significant problems i wrote a call in rx

## 2012-12-09 NOTE — Telephone Encounter (Signed)
Hydrocodone called into pharmacy. Left message for patient.

## 2012-12-16 ENCOUNTER — Telehealth: Payer: Self-pay | Admitting: Neurology

## 2012-12-16 ENCOUNTER — Other Ambulatory Visit: Payer: Self-pay | Admitting: Family Medicine

## 2012-12-16 NOTE — Telephone Encounter (Signed)
Pt called to cancel NPSG/MSLT scheduled for 12/16/2012 and 12/17/2012.  She will call back to reschedule.

## 2012-12-17 ENCOUNTER — Encounter: Payer: Medicare Other | Admitting: *Deleted

## 2012-12-21 ENCOUNTER — Encounter: Payer: Self-pay | Admitting: Physical Medicine & Rehabilitation

## 2012-12-21 ENCOUNTER — Encounter
Payer: PRIVATE HEALTH INSURANCE | Attending: Physical Medicine & Rehabilitation | Admitting: Physical Medicine & Rehabilitation

## 2012-12-21 VITALS — BP 122/62 | HR 79 | Resp 14 | Ht 64.0 in | Wt 153.8 lb

## 2012-12-21 DIAGNOSIS — G2581 Restless legs syndrome: Secondary | ICD-10-CM

## 2012-12-21 DIAGNOSIS — IMO0002 Reserved for concepts with insufficient information to code with codable children: Secondary | ICD-10-CM

## 2012-12-21 DIAGNOSIS — M1711 Unilateral primary osteoarthritis, right knee: Secondary | ICD-10-CM

## 2012-12-21 DIAGNOSIS — M67919 Unspecified disorder of synovium and tendon, unspecified shoulder: Secondary | ICD-10-CM

## 2012-12-21 DIAGNOSIS — M75101 Unspecified rotator cuff tear or rupture of right shoulder, not specified as traumatic: Secondary | ICD-10-CM

## 2012-12-21 DIAGNOSIS — M961 Postlaminectomy syndrome, not elsewhere classified: Secondary | ICD-10-CM

## 2012-12-21 DIAGNOSIS — M1710 Unilateral primary osteoarthritis, unspecified knee: Secondary | ICD-10-CM

## 2012-12-21 DIAGNOSIS — M719 Bursopathy, unspecified: Secondary | ICD-10-CM | POA: Insufficient documentation

## 2012-12-21 DIAGNOSIS — M170 Bilateral primary osteoarthritis of knee: Secondary | ICD-10-CM

## 2012-12-21 MED ORDER — FENTANYL 75 MCG/HR TD PT72: 1.0000 | MEDICATED_PATCH | TRANSDERMAL | Status: DC

## 2012-12-21 NOTE — Progress Notes (Signed)
Subjective:    Patient ID: Jill Phillips, female    DOB: Jun 04, 1956, 57 y.o.   MRN: [id number]  HPI  Monice is back regarding her chronic pain and RLS syndrome. She feels that the trileptal has helped when she takes two during the day. We replaced her talwin with hydrocodone which has sufficed for her general pain but may have caused some headache. Between the trileptal and klonopin her RLS is generally under good control.  Pain Inventory Average Pain 5 Pain Right Now 5 My pain is sharp and aching  In the last 24 hours, has pain interfered with the following? General activity 0 Relation with others 0 Enjoyment of life 0 What TIME of day is your pain at its worst? evening and night Sleep (in general) Good  Pain is worse with: walking and some activites Pain improves with: rest and medication Relief from Meds: 10  Mobility walk without assistance  Function disabled: date disabled .  Neuro/Psych No problems in this area  Prior Studies Any changes since last visit?  no  Physicians involved in your care Any changes since last visit?  no   Family History  Problem Relation Age of Onset  . Kidney disease Mother   . Heart disease Father   . Anuerysm Brother 29    brain  . Heart disease Brother   . Heart disease Sister 58    s/p CABG  . Hypertension Sister    History   Social History  . Marital Status: Divorced    Spouse Name: n/a    Number of Children: 2  . Years of Education: 12+   Occupational History  . disability     back surgeries   Social History Main Topics  . Smoking status: Current Every Day Smoker -- 1.50 packs/day for 45 years    Types: Cigarettes  . Smokeless tobacco: Never Used  . Alcohol Use: No  . Drug Use: No  . Sexually Active: Not Currently -- Female partner(s)   Other Topics Concern  . None   Social History Narrative   Lives alone.  One daughter is local, but is getting ready to move to [place], where her children live with their  father.  The other daughter lives near [place], [place].   Past Surgical History  Procedure Laterality Date  . Appendectomy    . Abdominal hysterectomy    . Tubal ligation    . Spine surgery      thoracic x 1,  lumbar x 15  . Right hip replacement    . Knee surgeries r knee      arthroscopy- right  . Hammer toe surgery    . Sleep apnea surgery    . Cholecystectomy    . Back surgery    . Joint replacement    . Total knee arthroplasty  05/29/2012    Procedure: TOTAL KNEE ARTHROPLASTY;  Surgeon: Mcarthur Rossetti, MD;  Location: [facility];  Service: Orthopedics;  Laterality: Right;  Right Total Knee Arthroplasty   Past Medical History  Diagnosis Date  . Postlaminectomy syndrome, thoracic region   . Osteoarthrosis, unspecified whether generalized or localized, lower leg   . Dysthymic disorder   . Calcifying tendinitis of shoulder   . Pain in joint, upper arm   . Chronic pain syndrome   . Lumbago   . Primary localized osteoarthrosis, lower leg   . Hypertension   . Hyperlipidemia   . GERD (gastroesophageal reflux disease)   . Thyroid disease   .  Restless leg syndrome   . PONV (postoperative nausea and vomiting)   . Heart murmur   . Sleep apnea     s/p surgery- last sleep study 2011- doesnt use oxygen or machine at night as instructed  . History of blood transfusion 1980  . Hypothyroidism    Ht 5\' 4"  (1.626 m)  Wt 153 lb 12.8 oz (69.763 kg)  BMI 26.39 kg/m2    Review of Systems  Musculoskeletal: Positive for back pain and gait problem.  All other systems reviewed and are negative.   BP 122/62  P 79 R 14 02 sat 96% WT 153.8 lb HT 64"    Objective:   Physical Exam Constitutional: She is oriented to person, place, and time. She appears well-developed and well-nourished.  HENT:  Head: Normocephalic.  Eyes: EOM are normal. Pupils are equal, round, and reactive to light.  Neck: Normal range of motion.  Cardiovascular: Normal rate.  Pulmonary/Chest: Effort normal.   Abdominal: Soft.  Musculoskeletal:  Right knee with minimal tenderness. Post-op scars noted. Knee rom 120+ with flexion. Weight bearing better with minimal antalgia seen.  Right shoulder with only mild tenderness with ROM. No shoulder instability noted. Had full AROM.  Generalized tenderness of c-spine with some limitation in ROM present Neurological: She is alert and oriented to person, place, and time.  Skin: Skin is warm.  Psychiatric: She has a normal mood and affect. Her behavior is normal. Judgment and thought content normal.   Assessment & Plan:   ASSESSMENT:  1. Chronic lumbar spine pain/post-lami syndrome  2. Osteoarthritis of the knees bilaterally, right greater than left.  3. Rotator cuff syndrome/subacromial bursitis.  4. Restless legs syndrome.  5. O2 dependent sleep apnea.  6. Tobacco abuse.   PLAN:  1. Discussed appropriate sleeping habits, ice to right shoulder. 2. Continue with 4mg  klonopin at night. Discussed the possibility of trying horizant. Consider mysoline trial or requip retrial.  3. Continue trileptal during the day for any daytime RLS.  4. One daily naproxen for Right RTC syndrome. 5. Refilled  Duragesic at 75 mcg 1 q.72h hours #10   without a refill. She doesn't need the hydrocodone refilled today.   6. Follow up with me in 7 weeks. 30 minutes of face to face patient care time were spent during this visit. All questions were encouraged and answered.

## 2012-12-21 NOTE — Patient Instructions (Signed)
CHECK WITH YOUR INSURANCE PLAN REGARDING THE USE OF HORIZANT

## 2012-12-28 ENCOUNTER — Encounter: Payer: Self-pay | Admitting: Physician Assistant

## 2012-12-28 ENCOUNTER — Ambulatory Visit (INDEPENDENT_AMBULATORY_CARE_PROVIDER_SITE_OTHER): Payer: Medicare Other | Admitting: Physician Assistant

## 2012-12-28 VITALS — BP 132/86 | HR 72 | Temp 97.6°F | Resp 18 | Ht 63.25 in | Wt 149.0 lb

## 2012-12-28 DIAGNOSIS — F17200 Nicotine dependence, unspecified, uncomplicated: Secondary | ICD-10-CM

## 2012-12-28 DIAGNOSIS — J209 Acute bronchitis, unspecified: Secondary | ICD-10-CM

## 2012-12-28 DIAGNOSIS — Z720 Tobacco use: Secondary | ICD-10-CM | POA: Insufficient documentation

## 2012-12-28 DIAGNOSIS — F17210 Nicotine dependence, cigarettes, uncomplicated: Secondary | ICD-10-CM | POA: Insufficient documentation

## 2012-12-28 MED ORDER — AZITHROMYCIN 250 MG PO TABS: ORAL_TABLET | ORAL | Status: DC

## 2012-12-28 MED ORDER — PREDNISONE 20 MG PO TABS: ORAL_TABLET | ORAL | Status: DC

## 2012-12-28 MED ORDER — ALBUTEROL SULFATE HFA 108 (90 BASE) MCG/ACT IN AERS: 2.0000 | INHALATION_SPRAY | Freq: Four times a day (QID) | RESPIRATORY_TRACT | Status: DC | PRN

## 2012-12-28 NOTE — Progress Notes (Signed)
Patient ID: TREVI REVOLORIO MRN: [id number], DOB: [birth year], 57 y.o. Date of Encounter: 12/28/2012, 4:55 PM    Chief Complaint:  Chief Complaint  Patient presents with  . alot chest congestion, cough, lungs hurt     HPI: 57 y.o. year old female says this started 8 days ago. Chest congestion with cough, phlegm. No head/nose congestion. No fever, chills.  Smokes- 1 ppd.      Home Meds: Current Outpatient Prescriptions on File Prior to Visit  Medication Sig Dispense Refill  . acetaminophen (TYLENOL) 500 MG tablet Take 1,000 mg by mouth every 6 (six) hours as needed. For pain      . ALPRAZolam (XANAX) 1 MG tablet Take 1 mg by mouth daily as needed. For nerves      . amLODipine (NORVASC) 5 MG tablet Take 5 mg by mouth at bedtime.       . benazepril (LOTENSIN) 20 MG tablet TAKE 1 TABLET BY MOUTH ONCE A DAY  30 tablet  3  . carisoprodol (SOMA) 350 MG tablet TAKE 1 TABLET BY MOUTH EVERY 8 HOURS AS NEEDED.  90 tablet  3  . clonazePAM (KLONOPIN) 2 MG tablet Take 2 tablets (4 mg total) by mouth at bedtime.  60 tablet  3  . cloNIDine (CATAPRES) 0.1 MG tablet TAKE 1 TABLET BY MOUTH 3 TIMES A DAY.  90 tablet  1  . diclofenac sodium (VOLTAREN) 1 % GEL APPLY 1 APPLICATION TOPICALLY 3 TIMES DAILY.  300 g  3  . ESZOPICLONE 3 MG tablet 3 mg at bedtime.       . fentaNYL (DURAGESIC - DOSED MCG/HR) 75 MCG/HR Place 1 patch (75 mcg total) onto the skin every 3 (three) days.  10 patch  0  . hydrochlorothiazide (HYDRODIURIL) 12.5 MG tablet Take 12.5 mg by mouth at bedtime.       [name] levothyroxine (SYNTHROID, LEVOTHROID) 25 MCG tablet TAKE 1 TABLET BY MOUTH AT BEDTIME.  30 tablet  3  . naproxen (NAPROSYN) 500 MG tablet       . omeprazole (PRILOSEC) 20 MG capsule TAKE 1 CAPSULE BY MOUTH ONCE DAILY.  30 capsule  3  . OVER THE COUNTER MEDICATION Take 1 tablet by mouth daily. vegetables laxative      . Oxcarbazepine (TRILEPTAL) 300 MG tablet Take 300 mg by mouth 2 (two) times daily as needed.       . pravastatin  (PRAVACHOL) 20 MG tablet Take 1 tablet (20 mg total) by mouth daily.  30 tablet  5  . promethazine (PHENERGAN) 25 MG tablet Take 25 mg by mouth daily as needed. For nausea      . valACYclovir (VALTREX) 500 MG tablet TAKE 1 TABLET BY MOUTH ONCE A DAY  30 tablet  3  . venlafaxine (EFFEXOR-XR) 150 MG 24 hr capsule Take 300 mg by mouth at bedtime.       . hydrochlorothiazide (MICROZIDE) 12.5 MG capsule TAKE 1 CAPSULE BY MOUTH ONCE A DAY  30 capsule  3  . HYDROcodone-acetaminophen (NORCO/VICODIN) 5-325 MG per tablet Take 1 tablet by mouth every 6 (six) hours as needed for pain.  45 tablet  0   No current facility-administered medications on file prior to visit.    Allergies:  Allergies  Allergen Reactions  . Amoxicillin     Oral yeast infection  . Chlorzoxazone     headache  . Codeine     headache  . Darvocet (Propoxyphene-Acetaminophen) Itching  . Dilaudid (Hydromorphone Hcl) Itching  .  Morphine And Related Itching  . Nitrofurantoin Monohyd Macro Hives  . Oxycodone-Acetaminophen Itching  . Percocet (Oxycodone-Acetaminophen) Itching  . Sulfa Antibiotics     Yeast infection in mouth      Review of Systems: See HPI. Other ROS negative   Physical Exam: Blood pressure 132/86, pulse 72, temperature 97.6 F (36.4 C), temperature source Oral, resp. rate 18, height 5' 3.25" (1.607 m), weight 149 lb (67.586 kg)., Body mass index is 26.17 kg/(m^2). General: Well developed, well nourished,WF. Smells of smoke.  in no acute distress. HEENT: Normocephalic, atraumatic, eyes without discharge, sclera non-icteric, nares are without discharge. Bilateral auditory canals clear, TM's are without perforation, pearly grey and translucent with reflective cone of light bilaterally. Oral cavity moist, posterior pharynx without exudate, erythema, peritonsillar abscess, or post nasal drip.  Neck: Supple. No thyromegaly. Full ROM. No lymphadenopathy. Lungs: Breathing is unlabored. There is some mild wheezing at  the end of expiration throughout bilaterally.  Heart: Regular rhythm. No murmurs, rubs, or gallops Msk:  Strength and tone normal for age. Extremities/Skin: Warm and dry. No clubbing or cyanosis. No edema. No rashes or suspicious lesions. Neuro: Alert and oriented X 3. Moves all extremities spontaneously. Gait is normal. CNII-XII grossly in tact. Psych:  Responds to questions appropriately with a normal affect.     ASSESSMENT AND PLAN:  57 y.o. year old female with  1. Acute bronchitis  - predniSONE (DELTASONE) 20 MG tablet; Take 3 daily for 2 days, then 2 daily for 2 days, then 1 daily for 2 days.  Dispense: 12 tablet; Refill: 0 - azithromycin (ZITHROMAX) 250 MG tablet; Day 1: Take 2 daily Days 2-5: Take 1 daily  Dispense: 6 tablet; Refill: 0 - albuterol (PROVENTIL HFA;VENTOLIN HFA) 108 (90 BASE) MCG/ACT inhaler; Inhale 2 puffs into the lungs every 6 (six) hours as needed for wheezing.  Dispense: 1 Inhaler; Refill: 0  F/U if worsens or if does not resolve in one week.  2. Active smoker Will not use Chantix with her current psych meds and pain meds. She says she is going to use the patch and will decrease and work on cessation.   [address] [place], [place], [facility] 12/28/2012 4:55 PM

## 2013-01-05 ENCOUNTER — Telehealth: Payer: Self-pay | Admitting: Physician Assistant

## 2013-01-05 NOTE — Telephone Encounter (Signed)
See if she can come in for me to re-examine.  She was seen 4/28 and treated with Prednisone pack, Azithrromycin, and Albuterol HFA. I would have expected for symptoms to be resolved.  May need a CXRay as mentioned, but she also needs to be re-evaluated.

## 2013-01-07 ENCOUNTER — Emergency Department (INDEPENDENT_AMBULATORY_CARE_PROVIDER_SITE_OTHER)
Admission: EM | Admit: 2013-01-07 | Discharge: 2013-01-07 | Disposition: A | Discharge status: Home / Self Care | Payer: PRIVATE HEALTH INSURANCE | Source: Home / Self Care | Attending: Emergency Medicine | Admitting: Emergency Medicine

## 2013-01-07 ENCOUNTER — Encounter (HOSPITAL_COMMUNITY): Payer: Self-pay | Admitting: Emergency Medicine

## 2013-01-07 ENCOUNTER — Other Ambulatory Visit: Payer: Self-pay

## 2013-01-07 ENCOUNTER — Emergency Department (INDEPENDENT_AMBULATORY_CARE_PROVIDER_SITE_OTHER): Payer: PRIVATE HEALTH INSURANCE

## 2013-01-07 DIAGNOSIS — R05 Cough: Secondary | ICD-10-CM

## 2013-01-07 DIAGNOSIS — R059 Cough, unspecified: Secondary | ICD-10-CM

## 2013-01-07 MED ORDER — HYDROCODONE-ACETAMINOPHEN 5-325 MG PO TABS: 1.0000 | ORAL_TABLET | Freq: Four times a day (QID) | ORAL | Status: DC | PRN

## 2013-01-07 MED ORDER — PREDNISONE 20 MG PO TABS: 40.0000 mg | ORAL_TABLET | Freq: Every day | ORAL | Status: AC

## 2013-01-07 NOTE — ED Notes (Signed)
Reports "bronchitis".  Aching, blowing thick, yellow phlegm from nose, chest congestion , unknown about fever, but having chills.

## 2013-01-07 NOTE — ED Provider Notes (Signed)
History     CSN: [id number]  Arrival date & time 01/07/13  80   First MD Initiated Contact with Patient 01/07/13 1410      Chief Complaint  Patient presents with  . Bronchitis    (Consider location/radiation/quality/duration/timing/severity/associated sxs/prior treatment) HPI Comments: Patient presents urgent care describing that she is being coughing for approximately 3 weeks. She has been seen by her primary care Dr. about 2 weeks ago when she was diagnosed with bronchitis and was treated with antibiotics, prednisone and albuterol. Patient describes that her symptoms have not gotten any better. He describes a constant bother some productive cough usually with yellowish looking phlegm. Denies any fevers or shortness of breath at rest or wheezing. She is not certain if she has had any fevers but has had some chills. Patient denies any abdominal pain chest pain nausea or vomiting.  Patient is a 57 y.o. female presenting with cough.  Cough Cough characteristics:  Productive Sputum characteristics:  Clear and yellow Severity:  Moderate Onset quality:  Gradual Duration:  3 weeks Timing:  Constant Progression:  Worsening Chronicity:  Recurrent Smoker: yes   Context: exposure to allergens   Context: not occupational exposure and not sick contacts   Relieved by:  Nothing Worsened by:  Deep breathing and activity Associated symptoms: no chest pain, no chills, no diaphoresis, no ear fullness, no ear pain, no eye discharge, no fever, no headaches, no myalgias, no rash, no shortness of breath, no sore throat, no weight loss and no wheezing   Risk factors: no recent infection and no recent travel     Past Medical History  Diagnosis Date  . Postlaminectomy syndrome, thoracic region   . Osteoarthrosis, unspecified whether generalized or localized, lower leg   . Dysthymic disorder   . Calcifying tendinitis of shoulder   . Pain in joint, upper arm   . Chronic pain syndrome   . Lumbago    . Primary localized osteoarthrosis, lower leg   . Hypertension   . Hyperlipidemia   . GERD (gastroesophageal reflux disease)   . Thyroid disease   . Restless leg syndrome   . PONV (postoperative nausea and vomiting)   . Heart murmur   . Sleep apnea     s/p surgery- last sleep study 2011- doesnt use oxygen or machine at night as instructed  . History of blood transfusion 1980  . Hypothyroidism   . Active smoker     Past Surgical History  Procedure Laterality Date  . Appendectomy    . Abdominal hysterectomy    . Tubal ligation    . Spine surgery      thoracic x 1,  lumbar x 15  . Right hip replacement    . Knee surgeries r knee      arthroscopy- right  . Hammer toe surgery    . Sleep apnea surgery    . Cholecystectomy    . Back surgery    . Joint replacement    . Total knee arthroplasty  05/29/2012    Procedure: TOTAL KNEE ARTHROPLASTY;  Surgeon: Mcarthur Rossetti, MD;  Location: [facility];  Service: Orthopedics;  Laterality: Right;  Right Total Knee Arthroplasty    Family History  Problem Relation Age of Onset  . Kidney disease Mother   . Heart disease Father   . Anuerysm Brother 29    brain  . Heart disease Brother   . Heart disease Sister 9    s/p CABG  . Hypertension Sister  History  Substance Use Topics  . Smoking status: Current Every Day Smoker -- 1.50 packs/day for 45 years    Types: Cigarettes  . Smokeless tobacco: Never Used  . Alcohol Use: No    OB History   Grav Para Term Preterm Abortions TAB SAB Ect Mult Living                  Review of Systems  Constitutional: Negative for fever, chills, weight loss, diaphoresis, activity change, appetite change and fatigue.  HENT: Negative for ear pain and sore throat.   Eyes: Negative for discharge.  Respiratory: Positive for cough. Negative for apnea, chest tightness, shortness of breath and wheezing.   Cardiovascular: Negative for chest pain.  Musculoskeletal: Negative for myalgias, joint  swelling and arthralgias.  Skin: Negative for rash.  Neurological: Negative for headaches.    Allergies  Amoxicillin; Chlorzoxazone; Codeine; Darvocet; Dilaudid; Morphine and related; Nitrofurantoin monohyd macro; Oxycodone-acetaminophen; Percocet; and Sulfa antibiotics  Home Medications   Current Outpatient Rx  Name  Route  Sig  Dispense  Refill  . acetaminophen (TYLENOL) 500 MG tablet   Oral   Take 1,000 mg by mouth every 6 (six) hours as needed. For pain         . albuterol (PROVENTIL HFA;VENTOLIN HFA) 108 (90 BASE) MCG/ACT inhaler   Inhalation   Inhale 2 puffs into the lungs every 6 (six) hours as needed for wheezing.   1 Inhaler   0   . ALPRAZolam (XANAX) 1 MG tablet   Oral   Take 1 mg by mouth daily as needed. For nerves         . amLODipine (NORVASC) 5 MG tablet   Oral   Take 5 mg by mouth at bedtime.          [name] azithromycin (ZITHROMAX) 250 MG tablet      Day 1: Take 2 daily Days 2-5: Take 1 daily   6 tablet   0   . benazepril (LOTENSIN) 20 MG tablet      TAKE 1 TABLET BY MOUTH ONCE A DAY   30 tablet   3   . carisoprodol (SOMA) 350 MG tablet      TAKE 1 TABLET BY MOUTH EVERY 8 HOURS AS NEEDED.   90 tablet   3   . clonazePAM (KLONOPIN) 2 MG tablet   Oral   Take 2 tablets (4 mg total) by mouth at bedtime.   60 tablet   3   . cloNIDine (CATAPRES) 0.1 MG tablet      TAKE 1 TABLET BY MOUTH 3 TIMES A DAY.   90 tablet   1   . diclofenac sodium (VOLTAREN) 1 % GEL      APPLY 1 APPLICATION TOPICALLY 3 TIMES DAILY.   300 g   3   . ESZOPICLONE 3 MG tablet      3 mg at bedtime.          . fentaNYL (DURAGESIC - DOSED MCG/HR) 75 MCG/HR   Transdermal   Place 1 patch (75 mcg total) onto the skin every 3 (three) days.   10 patch   0   . hydrochlorothiazide (HYDRODIURIL) 12.5 MG tablet   Oral   Take 12.5 mg by mouth at bedtime.          . hydrochlorothiazide (MICROZIDE) 12.5 MG capsule      TAKE 1 CAPSULE BY MOUTH ONCE A DAY   30  capsule   3   .  HYDROcodone-acetaminophen (NORCO/VICODIN) 5-325 MG per tablet   Oral   Take 1 tablet by mouth every 6 (six) hours as needed for pain.   45 tablet   0   . levothyroxine (SYNTHROID, LEVOTHROID) 25 MCG tablet      TAKE 1 TABLET BY MOUTH AT BEDTIME.   30 tablet   3   . naproxen (NAPROSYN) 500 MG tablet               . omeprazole (PRILOSEC) 20 MG capsule      TAKE 1 CAPSULE BY MOUTH ONCE DAILY.   30 capsule   3   . OVER THE COUNTER MEDICATION   Oral   Take 1 tablet by mouth daily. vegetables laxative         . Oxcarbazepine (TRILEPTAL) 300 MG tablet   Oral   Take 300 mg by mouth 2 (two) times daily as needed.          . pravastatin (PRAVACHOL) 20 MG tablet   Oral   Take 1 tablet (20 mg total) by mouth daily.   30 tablet   5   . predniSONE (DELTASONE) 20 MG tablet      Take 3 daily for 2 days, then 2 daily for 2 days, then 1 daily for 2 days.   12 tablet   0   . predniSONE (DELTASONE) 20 MG tablet   Oral   Take 2 tablets (40 mg total) by mouth daily. 2 tablets daily for 5 days   10 tablet   0   . promethazine (PHENERGAN) 25 MG tablet   Oral   Take 25 mg by mouth daily as needed. For nausea         . valACYclovir (VALTREX) 500 MG tablet      TAKE 1 TABLET BY MOUTH ONCE A DAY   30 tablet   3   . venlafaxine (EFFEXOR-XR) 150 MG 24 hr capsule   Oral   Take 300 mg by mouth at bedtime.            BP 127/82  Pulse 82  Temp(Src) 98.5 F (36.9 C) (Oral)  Resp 16  SpO2 100%  Physical Exam  Nursing note and vitals reviewed. Constitutional: She appears well-developed and well-nourished. No distress.  HENT:  Head: Normocephalic.  Eyes: Conjunctivae are normal. No scleral icterus.  Neck: Neck supple. No JVD present.  Pulmonary/Chest: Effort normal. No accessory muscle usage. Not tachypneic. No respiratory distress. She has decreased breath sounds. She has no wheezes. She has no rhonchi. She has no rales. She exhibits no  tenderness.  Neurological: She is alert.  Skin: No rash noted.    ED Course  Procedures (including critical care time)  Labs Reviewed - No data to display Dg Chest 2 View  01/07/2013  *RADIOLOGY REPORT*  Clinical Data: Cough and congestion.  Bronchitis.  CHEST - 2 VIEW  Comparison: PA and lateral chest 05/26/2012.  Findings: Lungs are clear.  Heart size is normal.  No pneumothorax or pleural effusion.  Spinal fusion at the thoracolumbar junction again seen.  IMPRESSION: No acute disease.   Original Report Authenticated By: Orlean Patten, M.D.      1. Cough    Heavy smoker: Exam room with marked order not unlike-cigarette   MDM  Prescribed 5 days Days of prednisone. Discussion about smoking hazards. Including chronic or recurrent cough.        Rosana Hoes, MD 01/07/13 [id number]

## 2013-01-07 NOTE — Telephone Encounter (Signed)
Pt is aware to make appt and will call back when she can get a ride.

## 2013-01-20 ENCOUNTER — Other Ambulatory Visit: Payer: Self-pay | Admitting: Physician Assistant

## 2013-01-20 NOTE — Telephone Encounter (Signed)
Medication refilled per protocol. 

## 2013-02-02 ENCOUNTER — Encounter: Payer: Self-pay | Admitting: Physical Medicine and Rehabilitation

## 2013-02-02 ENCOUNTER — Encounter
Payer: PRIVATE HEALTH INSURANCE | Attending: Physical Medicine and Rehabilitation | Admitting: Physical Medicine and Rehabilitation

## 2013-02-02 VITALS — BP 136/87 | HR 91 | Resp 16 | Ht 63.0 in | Wt 152.0 lb

## 2013-02-02 DIAGNOSIS — G4730 Sleep apnea, unspecified: Secondary | ICD-10-CM | POA: Insufficient documentation

## 2013-02-02 DIAGNOSIS — M1710 Unilateral primary osteoarthritis, unspecified knee: Secondary | ICD-10-CM | POA: Insufficient documentation

## 2013-02-02 DIAGNOSIS — M67919 Unspecified disorder of synovium and tendon, unspecified shoulder: Secondary | ICD-10-CM

## 2013-02-02 DIAGNOSIS — M719 Bursopathy, unspecified: Secondary | ICD-10-CM

## 2013-02-02 DIAGNOSIS — M25559 Pain in unspecified hip: Secondary | ICD-10-CM | POA: Insufficient documentation

## 2013-02-02 DIAGNOSIS — M961 Postlaminectomy syndrome, not elsewhere classified: Secondary | ICD-10-CM | POA: Insufficient documentation

## 2013-02-02 DIAGNOSIS — IMO0002 Reserved for concepts with insufficient information to code with codable children: Secondary | ICD-10-CM

## 2013-02-02 DIAGNOSIS — M545 Low back pain, unspecified: Secondary | ICD-10-CM | POA: Insufficient documentation

## 2013-02-02 DIAGNOSIS — M170 Bilateral primary osteoarthritis of knee: Secondary | ICD-10-CM

## 2013-02-02 DIAGNOSIS — G8929 Other chronic pain: Secondary | ICD-10-CM | POA: Insufficient documentation

## 2013-02-02 DIAGNOSIS — G2581 Restless legs syndrome: Secondary | ICD-10-CM | POA: Insufficient documentation

## 2013-02-02 DIAGNOSIS — M75101 Unspecified rotator cuff tear or rupture of right shoulder, not specified as traumatic: Secondary | ICD-10-CM

## 2013-02-02 MED ORDER — HYDROCODONE-ACETAMINOPHEN 5-325 MG PO TABS: 1.0000 | ORAL_TABLET | Freq: Four times a day (QID) | ORAL | Status: DC | PRN

## 2013-02-02 MED ORDER — FENTANYL 75 MCG/HR TD PT72: 1.0000 | MEDICATED_PATCH | TRANSDERMAL | Status: DC

## 2013-02-02 NOTE — Progress Notes (Signed)
Subjective:    Patient ID: Jill Phillips, female    DOB: Oct 06, 1955, 57 y.o.   MRN: [id number]  HPI The patient complains about chronic LBP , and restless leg syndrome bilateral .  The problem has been stable.Patient reports that she will schedule a sleep study soon. She also complains about RLS, which also has been stable. Hx. Of multiple back surgeries, with PSF  Pain Inventory Average Pain 6 Pain Right Now 3 My pain is aching  In the last 24 hours, has pain interfered with the following? General activity 5 Relation with others 3 Enjoyment of life 0 What TIME of day is your pain at its worst? na Sleep (in general) NA  Pain is worse with: nA Pain improves with: na Relief from Meds: na  Mobility walk without assistance how many minutes can you walk? 10 ability to climb steps?  yes do you drive?  yes transfers alone  Function Do you have any goals in this area?  no  Neuro/Psych bladder control problems trouble walking spasms  Prior Studies Any changes since last visit?  no  Physicians involved in your care Any changes since last visit?  no   Family History  Problem Relation Age of Onset  . Kidney disease Mother   . Heart disease Father   . Anuerysm Brother 29    brain  . Heart disease Brother   . Heart disease Sister 56    s/p CABG  . Hypertension Sister    History   Social History  . Marital Status: Divorced    Spouse Name: n/a    Number of Children: 2  . Years of Education: 12+   Occupational History  . disability     back surgeries   Social History Main Topics  . Smoking status: Current Every Day Smoker -- 1.50 packs/day for 45 years    Types: Cigarettes  . Smokeless tobacco: Never Used  . Alcohol Use: No  . Drug Use: No  . Sexually Active: Not Currently -- Female partner(s)   Other Topics Concern  . None   Social History Narrative   Lives alone.  One daughter is local, but is getting ready to move to [place], where her children  live with their father.  The other daughter lives near [place], [place].   Past Surgical History  Procedure Laterality Date  . Appendectomy    . Abdominal hysterectomy    . Tubal ligation    . Spine surgery      thoracic x 1,  lumbar x 15  . Right hip replacement    . Knee surgeries r knee      arthroscopy- right  . Hammer toe surgery    . Sleep apnea surgery    . Cholecystectomy    . Back surgery    . Joint replacement    . Total knee arthroplasty  05/29/2012    Procedure: TOTAL KNEE ARTHROPLASTY;  Surgeon: Mcarthur Rossetti, MD;  Location: [facility];  Service: Orthopedics;  Laterality: Right;  Right Total Knee Arthroplasty   Past Medical History  Diagnosis Date  . Postlaminectomy syndrome, thoracic region   . Osteoarthrosis, unspecified whether generalized or localized, lower leg   . Dysthymic disorder   . Calcifying tendinitis of shoulder   . Pain in joint, upper arm   . Chronic pain syndrome   . Lumbago   . Primary localized osteoarthrosis, lower leg   . Hypertension   . Hyperlipidemia   . GERD (gastroesophageal reflux  disease)   . Thyroid disease   . Restless leg syndrome   . PONV (postoperative nausea and vomiting)   . Heart murmur   . Sleep apnea     s/p surgery- last sleep study 2011- doesnt use oxygen or machine at night as instructed  . History of blood transfusion 1980  . Hypothyroidism   . Active smoker    BP 136/87  Pulse 91  Resp 16  Ht 5\' 3"  (1.6 m)  Wt 152 lb (68.947 kg)  BMI 26.93 kg/m2  SpO2 97%     Review of Systems  Musculoskeletal: Positive for gait problem.  Neurological:       Spasms  All other systems reviewed and are negative.       Objective:   Physical Exam Physical Exam  Constitutional: She is oriented to person, place, and time. She appears well-developed and well-nourished.  HENT:  Head: Normocephalic.  Musculoskeletal: She exhibits tenderness.  Neurological: She is alert and oriented to person, place, and time.  Skin:  Skin is warm and dry.  Psychiatric: She has a normal mood and affect.  Symmetric normal motor tone is noted throughout. Normal muscle bulk. Muscle testing reveals 5/5 muscle strength of the upper extremity, and 5/5 of the lower extremity. Full range of motion in upper and lower extremities, except decrease ROM in right knee, extension deficit. ROM of spine is restricted. Fine motor movements are normal in both hands.  DTR in the upper and lower extremity are present and symmetric 2+, except left patella reflex 0. No clonus is noted.  Patient arises from chair with mild difficulty. Narrow based gait with normal arm swing bilateral.        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 Ninfa Linden, 05/29/2012, right knee is almost pain free.  2.Post laminectomy syndrome  3.restless leg syndrome  4. Bilateral hip pain  5. Hx of Sleep Apnea, patient is not using a C-PAP, has some symptoms of sleep apnea. States that she will schedule a sleep study soon. Plan :  1. Discussed appropriate sleeping habits, ice to right shoulder..  2. We will continue with Klonopin for now--consider day time dosing.   3. Refilled Fentanyl Duragesic at 75 mcg 1 q.72h hours #10 each without a refill.  4. Follow up with my PA in one month.  Advised pateint to continue with staying as active as possible, also she should look into aquatic exercises.  Follow up in 1 month.

## 2013-02-02 NOTE — Patient Instructions (Signed)
Try to look into aquatic exercising.

## 2013-02-08 ENCOUNTER — Ambulatory Visit: Payer: PRIVATE HEALTH INSURANCE | Admitting: Physical Medicine & Rehabilitation

## 2013-02-09 ENCOUNTER — Other Ambulatory Visit: Payer: Self-pay | Admitting: Family Medicine

## 2013-02-09 ENCOUNTER — Other Ambulatory Visit: Payer: Self-pay | Admitting: Physical Medicine & Rehabilitation

## 2013-02-10 ENCOUNTER — Other Ambulatory Visit: Payer: Self-pay

## 2013-02-10 MED ORDER — HYDROCODONE-ACETAMINOPHEN 5-325 MG PO TABS: 1.0000 | ORAL_TABLET | Freq: Four times a day (QID) | ORAL | Status: DC | PRN

## 2013-02-10 NOTE — Telephone Encounter (Signed)
It is ok to refill

## 2013-02-15 ENCOUNTER — Ambulatory Visit: Payer: Medicare Other | Admitting: Family Medicine

## 2013-02-15 ENCOUNTER — Encounter (HOSPITAL_COMMUNITY): Payer: Self-pay

## 2013-02-15 ENCOUNTER — Emergency Department (HOSPITAL_COMMUNITY)
Admission: EM | Admit: 2013-02-15 | Discharge: 2013-02-15 | Disposition: A | Discharge status: Home / Self Care | Payer: PRIVATE HEALTH INSURANCE | Attending: Emergency Medicine | Admitting: Emergency Medicine

## 2013-02-15 DIAGNOSIS — E785 Hyperlipidemia, unspecified: Secondary | ICD-10-CM | POA: Insufficient documentation

## 2013-02-15 DIAGNOSIS — F341 Dysthymic disorder: Secondary | ICD-10-CM | POA: Insufficient documentation

## 2013-02-15 DIAGNOSIS — Z9889 Other specified postprocedural states: Secondary | ICD-10-CM | POA: Insufficient documentation

## 2013-02-15 DIAGNOSIS — E039 Hypothyroidism, unspecified: Secondary | ICD-10-CM | POA: Insufficient documentation

## 2013-02-15 DIAGNOSIS — G894 Chronic pain syndrome: Secondary | ICD-10-CM | POA: Insufficient documentation

## 2013-02-15 DIAGNOSIS — Z8669 Personal history of other diseases of the nervous system and sense organs: Secondary | ICD-10-CM | POA: Insufficient documentation

## 2013-02-15 DIAGNOSIS — K219 Gastro-esophageal reflux disease without esophagitis: Secondary | ICD-10-CM | POA: Insufficient documentation

## 2013-02-15 DIAGNOSIS — F17200 Nicotine dependence, unspecified, uncomplicated: Secondary | ICD-10-CM | POA: Insufficient documentation

## 2013-02-15 DIAGNOSIS — G8929 Other chronic pain: Secondary | ICD-10-CM

## 2013-02-15 DIAGNOSIS — Z79899 Other long term (current) drug therapy: Secondary | ICD-10-CM | POA: Insufficient documentation

## 2013-02-15 DIAGNOSIS — N390 Urinary tract infection, site not specified: Secondary | ICD-10-CM

## 2013-02-15 DIAGNOSIS — Z8739 Personal history of other diseases of the musculoskeletal system and connective tissue: Secondary | ICD-10-CM | POA: Insufficient documentation

## 2013-02-15 DIAGNOSIS — M549 Dorsalgia, unspecified: Secondary | ICD-10-CM | POA: Insufficient documentation

## 2013-02-15 DIAGNOSIS — Z8719 Personal history of other diseases of the digestive system: Secondary | ICD-10-CM | POA: Insufficient documentation

## 2013-02-15 DIAGNOSIS — M1710 Unilateral primary osteoarthritis, unspecified knee: Secondary | ICD-10-CM | POA: Insufficient documentation

## 2013-02-15 DIAGNOSIS — Z880 Allergy status to penicillin: Secondary | ICD-10-CM | POA: Insufficient documentation

## 2013-02-15 DIAGNOSIS — R011 Cardiac murmur, unspecified: Secondary | ICD-10-CM | POA: Insufficient documentation

## 2013-02-15 DIAGNOSIS — I10 Essential (primary) hypertension: Secondary | ICD-10-CM | POA: Insufficient documentation

## 2013-02-15 DIAGNOSIS — IMO0002 Reserved for concepts with insufficient information to code with codable children: Secondary | ICD-10-CM | POA: Insufficient documentation

## 2013-02-15 LAB — URINALYSIS, ROUTINE W REFLEX MICROSCOPIC
Bilirubin Urine: NEGATIVE
Glucose, UA: NEGATIVE mg/dL
Hgb urine dipstick: NEGATIVE
Ketones, ur: NEGATIVE mg/dL
Nitrite: NEGATIVE
Protein, ur: NEGATIVE mg/dL
Specific Gravity, Urine: 1.036 — ABNORMAL HIGH (ref 1.005–1.030)
Urobilinogen, UA: 0.2 mg/dL (ref 0.0–1.0)
pH: 5 (ref 5.0–8.0)

## 2013-02-15 LAB — URINE MICROSCOPIC-ADD ON

## 2013-02-15 MED ORDER — CEPHALEXIN 500 MG PO CAPS
500.0000 mg | ORAL_CAPSULE | Freq: Once | ORAL | Status: AC
  Administered 2013-02-15: 500 mg via ORAL
  Filled 2013-02-15: qty 1

## 2013-02-15 MED ORDER — CEPHALEXIN 500 MG PO CAPS: 500.0000 mg | ORAL_CAPSULE | Freq: Four times a day (QID) | ORAL | Status: DC

## 2013-02-15 NOTE — ED Provider Notes (Signed)
History     CSN: [id number]  Arrival date & time 02/15/13  1014   First MD Initiated Contact with Patient 02/15/13 1151      Chief Complaint  Patient presents with  . Back Pain    (Consider location/radiation/quality/duration/timing/severity/associated sxs/prior treatment) HPI  57 year old female with history of osteoarthritis of both knees, postlaminectomy syndrome, hx of multiple back surgery, history of chronic back pain currently on a Fentanyl Duragesic patch presents complaining of back pain. Patient reports for the past 3 weeks she has had intermittent mid back pain. Describe pain as a sharp and aching sensation worsening with movement and improved with sitting still. States she usually noticed pain when she walks long distance especially going to [employer]. She also notice brown discoloration in her underwear which she suspects she may have blood in her urine. She denies any urinary symptoms including no burning urination, frequency or urgency. Denies fever, chills, weight changes, night sweats, chest pain, shortness of breath, abdominal pain, or rash. Patient states she was deciding to follow up at the pain clinic but her neighbors convince her to come to the ER instead.     Past Medical History  Diagnosis Date  . Postlaminectomy syndrome, thoracic region   . Osteoarthrosis, unspecified whether generalized or localized, lower leg   . Dysthymic disorder   . Calcifying tendinitis of shoulder   . Pain in joint, upper arm   . Chronic pain syndrome   . Lumbago   . Primary localized osteoarthrosis, lower leg   . Hypertension   . Hyperlipidemia   . GERD (gastroesophageal reflux disease)   . Thyroid disease   . Restless leg syndrome   . PONV (postoperative nausea and vomiting)   . Heart murmur   . Sleep apnea     s/p surgery- last sleep study 2011- doesnt use oxygen or machine at night as instructed  . History of blood transfusion 1980  . Hypothyroidism   . Active smoker      Past Surgical History  Procedure Laterality Date  . Appendectomy    . Abdominal hysterectomy    . Tubal ligation    . Spine surgery      thoracic x 1,  lumbar x 15  . Right hip replacement    . Knee surgeries r knee      arthroscopy- right  . Hammer toe surgery    . Sleep apnea surgery    . Cholecystectomy    . Back surgery    . Joint replacement    . Total knee arthroplasty  05/29/2012    Procedure: TOTAL KNEE ARTHROPLASTY;  Surgeon: Mcarthur Rossetti, MD;  Location: [facility];  Service: Orthopedics;  Laterality: Right;  Right Total Knee Arthroplasty    Family History  Problem Relation Age of Onset  . Kidney disease Mother   . Heart disease Father   . Anuerysm Brother 29    brain  . Heart disease Brother   . Heart disease Sister 29    s/p CABG  . Hypertension Sister     History  Substance Use Topics  . Smoking status: Current Every Day Smoker -- 1.50 packs/day for 45 years    Types: Cigarettes  . Smokeless tobacco: Never Used  . Alcohol Use: No    OB History   Grav Para Term Preterm Abortions TAB SAB Ect Mult Living                  Review of Systems  All other  systems reviewed and are negative.    Allergies  Amoxicillin; Chlorzoxazone; Codeine; Darvocet; Dilaudid; Morphine and related; Nitrofurantoin monohyd macro; Oxycodone-acetaminophen; Percocet; and Sulfa antibiotics  Home Medications   Current Outpatient Rx  Name  Route  Sig  Dispense  Refill  . acetaminophen (TYLENOL) 500 MG tablet   Oral   Take 1,000 mg by mouth every 6 (six) hours as needed. For pain         . albuterol (PROVENTIL HFA;VENTOLIN HFA) 108 (90 BASE) MCG/ACT inhaler   Inhalation   Inhale 2 puffs into the lungs every 6 (six) hours as needed for wheezing.         [name] ALPRAZolam (XANAX) 1 MG tablet   Oral   Take 1 mg by mouth daily as needed. For nerves         . amLODipine (NORVASC) 5 MG tablet   Oral   Take 5 mg by mouth at bedtime.          . benazepril  (LOTENSIN) 20 MG tablet   Oral   Take 20 mg by mouth daily.         . carisoprodol (SOMA) 350 MG tablet   Oral   Take 350 mg by mouth every 8 (eight) hours as needed for muscle spasms.         . clonazePAM (KLONOPIN) 2 MG tablet   Oral   Take 2 tablets (4 mg total) by mouth at bedtime.   60 tablet   3   . cloNIDine (CATAPRES) 0.1 MG tablet   Oral   Take 0.1 mg by mouth 2 (two) times daily.         . diclofenac sodium (VOLTAREN) 1 % GEL   Topical   Apply 2 g topically 3 (three) times daily as needed (for pain). Apply to right arm         . docusate sodium (COLACE) 100 MG capsule   Oral   Take 100 mg by mouth at bedtime.         [name] ESZOPICLONE 3 MG tablet      3 mg at bedtime.          . fentaNYL (DURAGESIC - DOSED MCG/HR) 75 MCG/HR   Transdermal   Place 1 patch (75 mcg total) onto the skin every 3 (three) days.   10 patch   0   . hydrochlorothiazide (HYDRODIURIL) 12.5 MG tablet   Oral   Take 12.5 mg by mouth at bedtime.          [name] HYDROcodone-acetaminophen (NORCO/VICODIN) 5-325 MG per tablet   Oral   Take 1 tablet by mouth every 6 (six) hours as needed for pain.   45 tablet   0   . levothyroxine (SYNTHROID, LEVOTHROID) 25 MCG tablet   Oral   Take 25 mcg by mouth daily before breakfast.         . naproxen (NAPROSYN) 500 MG tablet   Oral   Take 500 mg by mouth at bedtime as needed (for pain).          [name] omeprazole (PRILOSEC) 20 MG capsule   Oral   Take 20 mg by mouth daily.         [name] OVER THE COUNTER MEDICATION   Oral   Take 1 tablet by mouth daily. vegetables laxative         . OVER THE COUNTER MEDICATION   Oral   Take 10 mLs by mouth daily as needed (anti-diarrhea).         [name]  Oxcarbazepine (TRILEPTAL) 300 MG tablet   Oral   Take 300 mg by mouth 2 (two) times daily as needed.          . pravastatin (PRAVACHOL) 20 MG tablet   Oral   Take 1 tablet (20 mg total) by mouth daily.   30 tablet   5   . promethazine (PHENERGAN)  25 MG tablet   Oral   Take 25 mg by mouth daily as needed. For nausea         . valACYclovir (VALTREX) 500 MG tablet   Oral   Take 500 mg by mouth daily.         [name] venlafaxine (EFFEXOR-XR) 150 MG 24 hr capsule   Oral   Take 300 mg by mouth at bedtime.            BP 104/68  Pulse 84  Temp(Src) 98.7 F (37.1 C) (Oral)  Resp 20  SpO2 96%  Physical Exam  Nursing note and vitals reviewed. Constitutional: She is oriented to person, place, and time. She appears well-developed and well-nourished. No distress.  HENT:  Head: Atraumatic.  Eyes: Conjunctivae are normal.  Neck: Neck supple.  Cardiovascular: Normal rate and regular rhythm.   Pulmonary/Chest: Effort normal and breath sounds normal.  Abdominal: Soft. Bowel sounds are normal. There is no tenderness.  No palpable abdominal pulsatile mass  Genitourinary:  No CVA tenderness  Musculoskeletal:  Well-healing midline lumbar surgical scar without any hernia overlying skin changes. No significant midline spine tenderness crepitus or step-off noted. There is evidence of scoliosis.  Normal bilateral hip flexion and extension. Negative straight leg raise. Patellar deep tendon reflex intact. No foot drop. Normal gait.  Neurological: She is alert and oriented to person, place, and time.  Skin: Skin is warm. No rash noted.  Psychiatric: She has a normal mood and affect.    ED Course  Procedures (including critical care time)  12:08 PM Pt report intermittent midback pain, worsening with movement.  Sxs intermittent for 3 weeks. No focal point tenderness on exam.  No red flags.  No sxs suggestive of cancer.  Pt does report worrying about blood in her urine and request a urinalysis, will obtain one.  Pt otherwise in no acute distress .   1:23 PM UA without evidence of hematuria. She does have 7-10 WBC. Consider patient has back pain, I think is worthwhile to treat for possible tract infection. Will treat with Keflex. Patient to  follow up with pain clinic for further management of her back pain.  Pt agrees with plan.     Labs Reviewed  URINALYSIS, ROUTINE W REFLEX MICROSCOPIC - Abnormal; Notable for the following:    Specific Gravity, Urine 1.036 (*)    Leukocytes, UA SMALL (*)    All other components within normal limits  URINE MICROSCOPIC-ADD ON - Abnormal; Notable for the following:    Squamous Epithelial / LPF FEW (*)    Bacteria, UA FEW (*)    All other components within normal limits  URINE CULTURE   No results found.   1. Back pain, chronic   2. UTI (lower urinary tract infection)       MDM  BP 104/68  Pulse 84  Temp(Src) 98.7 F (37.1 C) (Oral)  Resp 20  SpO2 96%  I have reviewed nursing notes and vital signs.  I reviewed available ER/hospitalization records thought the EMR         Domenic Moras, [place] 02/15/13 1343

## 2013-02-15 NOTE — ED Notes (Signed)
Per EMS- Patient reports back pain x 1 month and a history of multiple back surgeries. Patient states she could not decide whether to come to go to her pain doctor or PCP and neighbors convinced her to come to the ED.

## 2013-02-15 NOTE — ED Notes (Signed)
Pt c/o mid back pain x4weeks, states having muscle spasms, no injury noted

## 2013-02-15 NOTE — ED Provider Notes (Signed)
Medical screening examination/treatment/procedure(s) were performed by non-physician practitioner and as supervising physician I was immediately available for consultation/collaboration.    Kathalene Frames, MD 02/15/13 [phone]

## 2013-02-16 ENCOUNTER — Ambulatory Visit: Payer: PRIVATE HEALTH INSURANCE | Admitting: Physical Medicine and Rehabilitation

## 2013-02-16 LAB — URINE CULTURE
Colony Count: NO GROWTH
Culture: NO GROWTH

## 2013-02-17 ENCOUNTER — Ambulatory Visit (INDEPENDENT_AMBULATORY_CARE_PROVIDER_SITE_OTHER): Payer: Medicare Other | Admitting: Family Medicine

## 2013-02-17 VITALS — BP 110/70 | HR 82 | Temp 97.8°F | Resp 18 | Ht 63.0 in | Wt 151.0 lb

## 2013-02-17 DIAGNOSIS — N898 Other specified noninflammatory disorders of vagina: Secondary | ICD-10-CM

## 2013-02-17 DIAGNOSIS — N390 Urinary tract infection, site not specified: Secondary | ICD-10-CM

## 2013-02-17 DIAGNOSIS — N760 Acute vaginitis: Secondary | ICD-10-CM

## 2013-02-17 DIAGNOSIS — B9689 Other specified bacterial agents as the cause of diseases classified elsewhere: Secondary | ICD-10-CM

## 2013-02-17 DIAGNOSIS — A499 Bacterial infection, unspecified: Secondary | ICD-10-CM

## 2013-02-17 LAB — WET PREP FOR TRICH, YEAST, CLUE
Trich, Wet Prep: NONE SEEN
Yeast Wet Prep HPF POC: NONE SEEN

## 2013-02-17 LAB — URINALYSIS, ROUTINE W REFLEX MICROSCOPIC
Bilirubin Urine: NEGATIVE
Glucose, UA: NEGATIVE mg/dL
Nitrite: NEGATIVE
Protein, ur: NEGATIVE mg/dL
Specific Gravity, Urine: >1.03 — ABNORMAL HIGH (ref 1.005–1.030)
Urobilinogen, UA: 0.2 mg/dL (ref 0.0–1.0)
pH: 5.5 (ref 5.0–8.0)

## 2013-02-17 LAB — URINALYSIS, MICROSCOPIC ONLY: Crystals: NONE SEEN

## 2013-02-17 MED ORDER — CIPROFLOXACIN HCL 500 MG PO TABS: 500.0000 mg | ORAL_TABLET | Freq: Two times a day (BID) | ORAL | Status: DC

## 2013-02-17 MED ORDER — METRONIDAZOLE 500 MG PO TABS: 500.0000 mg | ORAL_TABLET | Freq: Two times a day (BID) | ORAL | Status: DC

## 2013-02-17 NOTE — Patient Instructions (Addendum)
Stop Keflex Start Cipro for urine infection Plenty of water or cranberry juice Flagyl for the Bacterial vaginosis F/U as previous

## 2013-02-18 DIAGNOSIS — N760 Acute vaginitis: Secondary | ICD-10-CM | POA: Insufficient documentation

## 2013-02-18 DIAGNOSIS — B9689 Other specified bacterial agents as the cause of diseases classified elsewhere: Secondary | ICD-10-CM | POA: Insufficient documentation

## 2013-02-18 DIAGNOSIS — N390 Urinary tract infection, site not specified: Secondary | ICD-10-CM | POA: Insufficient documentation

## 2013-02-18 NOTE — Assessment & Plan Note (Signed)
Will switch her to cipro twice a day, stop keflex Fluids, cranberry

## 2013-02-18 NOTE — Progress Notes (Signed)
  Subjective:    Patient ID: Jill Phillips, female    DOB: Jan 01, 1956, 57 y.o.   MRN: [id number]  HPI  Pt here to f/u ER visit. Was seen in ER 2 days ago for back pain and dysuria, diagnosed with UTI, given keflex however she took a dose and this made her very sick with nausea and diarrhea which has now resolved. Requesting new antibiotic. Denies abd pain, fever. Has chronic back pain, in pain clinic due to multiple surgeries, this was more of an aching feeling. She also noted she has had vaginal discharge, thought she smelled blood,had brownish tinge to discharge.  Review of Systems - per above  GEN- denies fatigue, fever, weight loss,weakness, recent illness HEENT- denies eye drainage, change in vision, nasal discharge, CVS- denies chest pain, palpitations RESP- denies SOB, cough, wheeze ABD- denies N/V, change in stools, abd pain GU- + dysuria, hematuria, dribbling, incontinence MSK- + joint pain, muscle aches, injury       Objective:   Physical Exam GEN- NAD, alert and oriented x3 CVS- RRR, no murmur RESP-CTAB ABD-NABS,soft, +TTP suprapubic region, no CVA tenderness,no rebound, no guarding GU- normal external genitalia, white discharge, s/p hysterectomy, pain in suprapubic region with palpation for ovaries, ovaries not felt. No bleeding noted Pulses- Radial 2+        Assessment & Plan:

## 2013-02-18 NOTE — Assessment & Plan Note (Signed)
Flagyl prescribed

## 2013-02-23 ENCOUNTER — Telehealth: Payer: Self-pay | Admitting: Family Medicine

## 2013-02-23 NOTE — Telephone Encounter (Signed)
Patient aware.

## 2013-02-23 NOTE — Telephone Encounter (Signed)
LMTRC

## 2013-02-23 NOTE — Telephone Encounter (Signed)
Stop all antibiotics, she should have had at least 5 days, this is enough to treat the UTI. Take immodium as per above, no further antibiotics needed. If diarrhea does not improve after 2-3 days let us know

## 2013-03-01 ENCOUNTER — Telehealth: Payer: Self-pay | Admitting: Family Medicine

## 2013-03-01 MED ORDER — METRONIDAZOLE 0.75 % VA GEL: 1.0000 | Freq: Two times a day (BID) | VAGINAL | Status: DC

## 2013-03-01 NOTE — Telephone Encounter (Signed)
I called and left voice mail, Metrogel vaginal suppository sent, she can take immodium for the diarrhea

## 2013-03-01 NOTE — Telephone Encounter (Signed)
Pt is c/o severe diarrhea since being on Flagyl so she didn't finish her doses and has also quit cipro. She is wanting to know if you can prescribe her some Metronidezol pills for her..states it works better for her.. She is still having some discharge and hopes that this will work.

## 2013-03-01 NOTE — Telephone Encounter (Signed)
I can give metro gel which is a vaginal suppository , flagyl pills and metronidazole pills are the same thing

## 2013-03-02 ENCOUNTER — Encounter: Payer: Self-pay | Admitting: Physical Medicine and Rehabilitation

## 2013-03-02 ENCOUNTER — Encounter
Payer: PRIVATE HEALTH INSURANCE | Attending: Physical Medicine and Rehabilitation | Admitting: Physical Medicine and Rehabilitation

## 2013-03-02 VITALS — BP 104/63 | HR 100 | Resp 14 | Ht 63.0 in | Wt 150.0 lb

## 2013-03-02 DIAGNOSIS — M75101 Unspecified rotator cuff tear or rupture of right shoulder, not specified as traumatic: Secondary | ICD-10-CM

## 2013-03-02 DIAGNOSIS — M1710 Unilateral primary osteoarthritis, unspecified knee: Secondary | ICD-10-CM | POA: Insufficient documentation

## 2013-03-02 DIAGNOSIS — M25559 Pain in unspecified hip: Secondary | ICD-10-CM | POA: Insufficient documentation

## 2013-03-02 DIAGNOSIS — M170 Bilateral primary osteoarthritis of knee: Secondary | ICD-10-CM

## 2013-03-02 DIAGNOSIS — G2581 Restless legs syndrome: Secondary | ICD-10-CM | POA: Insufficient documentation

## 2013-03-02 DIAGNOSIS — M961 Postlaminectomy syndrome, not elsewhere classified: Secondary | ICD-10-CM

## 2013-03-02 DIAGNOSIS — Z96659 Presence of unspecified artificial knee joint: Secondary | ICD-10-CM | POA: Insufficient documentation

## 2013-03-02 DIAGNOSIS — G4730 Sleep apnea, unspecified: Secondary | ICD-10-CM | POA: Insufficient documentation

## 2013-03-02 DIAGNOSIS — M67919 Unspecified disorder of synovium and tendon, unspecified shoulder: Secondary | ICD-10-CM

## 2013-03-02 DIAGNOSIS — IMO0002 Reserved for concepts with insufficient information to code with codable children: Secondary | ICD-10-CM

## 2013-03-02 DIAGNOSIS — G8929 Other chronic pain: Secondary | ICD-10-CM | POA: Insufficient documentation

## 2013-03-02 DIAGNOSIS — M719 Bursopathy, unspecified: Secondary | ICD-10-CM

## 2013-03-02 MED ORDER — FENTANYL 75 MCG/HR TD PT72: 1.0000 | MEDICATED_PATCH | TRANSDERMAL | Status: DC

## 2013-03-02 MED ORDER — HYDROCODONE-ACETAMINOPHEN 5-325 MG PO TABS: 1.0000 | ORAL_TABLET | Freq: Four times a day (QID) | ORAL | Status: DC | PRN

## 2013-03-02 NOTE — Patient Instructions (Signed)
Try to stay as active as tolerated, follow up with Dr. [name], and try to schedule your sleep study.

## 2013-03-02 NOTE — Progress Notes (Signed)
Subjective:    Patient ID: Jill Phillips, female    DOB: 24-Apr-1956, 57 y.o.   MRN: [id number]  HPI The patient complains about chronic LBP , and restless leg syndrome bilateral .  The problem has been stable.Patient reports that she will schedule a sleep study soon. She also complains about RLS, which also has been stable.  Hx. Of multiple back surgeries, with PSF  Pain Inventory Average Pain 5 Pain Right Now 6 My pain is spasms  In the last 24 hours, has pain interfered with the following? General activity 6 Relation with others 6 Enjoyment of life 6 What TIME of day is your pain at its worst? daytime Sleep (in general) Poor  Pain is worse with: walking, bending and standing Pain improves with: medication Relief from Meds: 6  Mobility walk without assistance how many minutes can you walk? 15 ability to climb steps?  yes do you drive?  yes transfers alone  Function Do you have any goals in this area?  no  Neuro/Psych No problems in this area  Prior Studies x-rays  Physicians involved in your care Dr Dennard Schaumann, Dr Candis Schatz, Dr [name]   Family History  Problem Relation Age of Onset  . Kidney disease Mother   . Heart disease Father   . Anuerysm Brother 29    brain  . Heart disease Brother   . Heart disease Sister 68    s/p CABG  . Hypertension Sister    History   Social History  . Marital Status: Divorced    Spouse Name: n/a    Number of Children: 2  . Years of Education: 12+   Occupational History  . disability     back surgeries   Social History Main Topics  . Smoking status: Current Every Day Smoker -- 1.50 packs/day for 45 years    Types: Cigarettes  . Smokeless tobacco: Never Used  . Alcohol Use: No  . Drug Use: No  . Sexually Active: Not Currently -- Female partner(s)   Other Topics Concern  . None   Social History Narrative   Lives alone.  One daughter is local, but is getting ready to move to [place], where her children live with  their father.  The other daughter lives near [place], [place].   Past Surgical History  Procedure Laterality Date  . Appendectomy    . Abdominal hysterectomy    . Tubal ligation    . Spine surgery      thoracic x 1,  lumbar x 15  . Right hip replacement    . Knee surgeries r knee      arthroscopy- right  . Hammer toe surgery    . Sleep apnea surgery    . Cholecystectomy    . Back surgery    . Joint replacement    . Total knee arthroplasty  05/29/2012    Procedure: TOTAL KNEE ARTHROPLASTY;  Surgeon: Mcarthur Rossetti, MD;  Location: [facility];  Service: Orthopedics;  Laterality: Right;  Right Total Knee Arthroplasty   Past Medical History  Diagnosis Date  . Postlaminectomy syndrome, thoracic region   . Osteoarthrosis, unspecified whether generalized or localized, lower leg   . Dysthymic disorder   . Calcifying tendinitis of shoulder   . Pain in joint, upper arm   . Chronic pain syndrome   . Lumbago   . Primary localized osteoarthrosis, lower leg   . Hypertension   . Hyperlipidemia   . GERD (gastroesophageal reflux disease)   .  Thyroid disease   . Restless leg syndrome   . PONV (postoperative nausea and vomiting)   . Heart murmur   . Sleep apnea     s/p surgery- last sleep study 2011- doesnt use oxygen or machine at night as instructed  . History of blood transfusion 1980  . Hypothyroidism   . Active smoker    BP 104/63  Pulse 100  Resp 14  Ht 5\' 3"  (1.6 m)  Wt 150 lb (68.04 kg)  BMI 26.58 kg/m2  SpO2 95%     Review of Systems  Musculoskeletal: Positive for back pain and gait problem.  All other systems reviewed and are negative.       Objective:   Physical Exam Constitutional: She is oriented to person, place, and time. She appears well-developed and well-nourished.  HENT:  Head: Normocephalic.  Musculoskeletal: She exhibits tenderness.  Neurological: She is alert and oriented to person, place, and time.  Skin: Skin is warm and dry.  Psychiatric: She  has a normal mood and affect.  Symmetric normal motor tone is noted throughout. Normal muscle bulk. Muscle testing reveals 5/5 muscle strength of the upper extremity, and 5/5 of the lower extremity. Full range of motion in upper and lower extremities, except decrease ROM in right knee, extension deficit. ROM of spine is restricted. Fine motor movements are normal in both hands.  DTR in the upper and lower extremity are present and symmetric 2+, except left patella reflex 0. No clonus is noted.  Patient arises from chair with mild difficulty. Narrow based gait with normal arm swing bilateral.        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 Ninfa Linden, 05/29/2012, right knee is almost pain free.  2.Post laminectomy syndrome  3.restless leg syndrome  4. Bilateral hip pain  5. Hx of Sleep Apnea, patient is not using a C-PAP, has some symptoms of sleep apnea. States that she will schedule a sleep study soon.  Plan :  1. Discussed appropriate sleeping habits, ice to right shoulder..  2. We will continue with Klonopin for now--consider day time dosing.  3. Refilled Hydrocodone 5/325 # 45,when due, Fentanyl Duragesic at 75 mcg 1 q.72h hours #10 each without a refill.  4. Follow up with PA in one month.  Advised patient to continue with staying as active as possible, also she should look into aquatic exercises.Advised patient to follow up with her orthopedic surgeon who suggested a shoe lift for her left .  Follow up in 1 month.

## 2013-03-09 ENCOUNTER — Other Ambulatory Visit: Payer: Self-pay | Admitting: Physical Medicine & Rehabilitation

## 2013-03-09 DIAGNOSIS — G2581 Restless legs syndrome: Secondary | ICD-10-CM

## 2013-03-09 MED ORDER — CLONAZEPAM 2 MG PO TABS: 4.0000 mg | ORAL_TABLET | Freq: Every day | ORAL | Status: DC

## 2013-03-10 ENCOUNTER — Telehealth: Payer: Self-pay | Admitting: Family Medicine

## 2013-03-10 NOTE — Telephone Encounter (Signed)
LMTRC

## 2013-03-11 ENCOUNTER — Other Ambulatory Visit: Payer: Self-pay | Admitting: Family Medicine

## 2013-03-11 ENCOUNTER — Ambulatory Visit: Payer: PRIVATE HEALTH INSURANCE

## 2013-03-11 ENCOUNTER — Ambulatory Visit (INDEPENDENT_AMBULATORY_CARE_PROVIDER_SITE_OTHER): Payer: PRIVATE HEALTH INSURANCE | Admitting: Family Medicine

## 2013-03-11 VITALS — BP 118/82 | HR 98 | Temp 98.1°F | Resp 18 | Ht 64.0 in | Wt 147.0 lb

## 2013-03-11 DIAGNOSIS — K5900 Constipation, unspecified: Secondary | ICD-10-CM

## 2013-03-11 DIAGNOSIS — R109 Unspecified abdominal pain: Secondary | ICD-10-CM

## 2013-03-11 DIAGNOSIS — R300 Dysuria: Secondary | ICD-10-CM

## 2013-03-11 DIAGNOSIS — M25559 Pain in unspecified hip: Secondary | ICD-10-CM

## 2013-03-11 LAB — POCT UA - MICROSCOPIC ONLY
Casts, Ur, LPF, POC: NEGATIVE
Crystals, Ur, HPF, POC: NEGATIVE
Mucus, UA: NEGATIVE
Yeast, UA: NEGATIVE

## 2013-03-11 LAB — POCT CBC
Granulocyte percent: 59.5 %G (ref 37–80)
HCT, POC: 42.3 % (ref 37.7–47.9)
Hemoglobin: 13.2 g/dL (ref 12.2–16.2)
Lymph, poc: 2.9 (ref 0.6–3.4)
MCH, POC: 30.7 pg (ref 27–31.2)
MCHC: 31.2 g/dL — AB (ref 31.8–35.4)
MCV: 98.3 fL — AB (ref 80–97)
MID (cbc): 0.6 (ref 0–0.9)
MPV: 8.9 fL (ref 0–99.8)
POC Granulocyte: 5.1 (ref 2–6.9)
POC LYMPH PERCENT: 34 %L (ref 10–50)
POC MID %: 6.5 %M (ref 0–12)
Platelet Count, POC: 271 10*3/uL (ref 142–424)
RBC: 4.3 M/uL (ref 4.04–5.48)
RDW, POC: 13.7 %
WBC: 8.5 10*3/uL (ref 4.6–10.2)

## 2013-03-11 LAB — POCT WET PREP WITH KOH
Clue Cells Wet Prep HPF POC: NEGATIVE
KOH Prep POC: NEGATIVE
Trichomonas, UA: NEGATIVE
WBC Wet Prep HPF POC: NEGATIVE
Yeast Wet Prep HPF POC: NEGATIVE

## 2013-03-11 LAB — POCT URINALYSIS DIPSTICK
Bilirubin, UA: NEGATIVE
Blood, UA: NEGATIVE
Glucose, UA: NEGATIVE
Ketones, UA: NEGATIVE
Leukocytes, UA: NEGATIVE
Nitrite, UA: NEGATIVE
Protein, UA: NEGATIVE
Spec Grav, UA: >=1.03
Urobilinogen, UA: 0.2
pH, UA: 6

## 2013-03-11 MED ORDER — PHENAZOPYRIDINE HCL 200 MG PO TABS: 200.0000 mg | ORAL_TABLET | Freq: Three times a day (TID) | ORAL | Status: DC | PRN

## 2013-03-11 NOTE — Telephone Encounter (Signed)
Called pt left mess.  Did she ask pharmacy to send this request?? Does she need more antibiotic??  Still have UTI??  May need to be seen again??

## 2013-03-11 NOTE — Telephone Encounter (Signed)
cipro 500 bid for 5 days, if no better needs to be seen.

## 2013-03-11 NOTE — Telephone Encounter (Signed)
Pt is stating that she is still having some pain with her bladder wants another refill of Cipro she did not finish her antibiotic flagyl for vaginistis d/t to having diarrhea so she stopped taking both cipro and flagyl now having pain and wants refill. Cant come in do to no ride.

## 2013-03-11 NOTE — Progress Notes (Signed)
[facility]:  Office Visit  Chief Complaint:  Chief Complaint  Patient presents with  . Urinary Tract Infection    continues to have pain in bladder     HPI: Jill Phillips is a 57 y.o. female who complains of her for pelvic pain. She was on cipro and also Flagyl for UTI and BV, then she developed a reaction specifically diarrhea from the Flagyl. She took the cipro for 10 days but had a break in between at 4-5 days and the flagyl she just stopped taking completely due to diarrhea She is constipated, she has not been straining sicne she did not want the gel to come out.  She has dull constant aching pain in her pelvic area  She has had a urine cx on 6/16 and it did not grow anything She denies fevers, chills.  She has had many surgeies, hysterectomy, no cervix  She has ahd diarrhea, nonbloody Has had BM 2 days ago Sexually active 3 montsh ago,new partner    Past Medical History  Diagnosis Date  . Postlaminectomy syndrome, thoracic region   . Osteoarthrosis, unspecified whether generalized or localized, lower leg   . Dysthymic disorder   . Calcifying tendinitis of shoulder   . Pain in joint, upper arm   . Chronic pain syndrome   . Lumbago   . Primary localized osteoarthrosis, lower leg   . Hypertension   . Hyperlipidemia   . GERD (gastroesophageal reflux disease)   . Thyroid disease   . Restless leg syndrome   . PONV (postoperative nausea and vomiting)   . Heart murmur   . Sleep apnea     s/p surgery- last sleep study 2011- doesnt use oxygen or machine at night as instructed  . History of blood transfusion 1980  . Hypothyroidism   . Active smoker    Past Surgical History  Procedure Laterality Date  . Appendectomy    . Abdominal hysterectomy    . Tubal ligation    . Spine surgery      thoracic x 1,  lumbar x 15  . Right hip replacement    . Knee surgeries r knee      arthroscopy- right  . Hammer toe surgery    . Sleep apnea surgery    .  Cholecystectomy    . Back surgery    . Joint replacement    . Total knee arthroplasty  05/29/2012    Procedure: TOTAL KNEE ARTHROPLASTY;  Surgeon: Mcarthur Rossetti, MD;  Location: [facility];  Service: Orthopedics;  Laterality: Right;  Right Total Knee Arthroplasty   History   Social History  . Marital Status: Divorced    Spouse Name: n/a    Number of Children: 2  . Years of Education: 12+   Occupational History  . disability     back surgeries   Social History Main Topics  . Smoking status: Current Every Day Smoker -- 1.50 packs/day for 45 years    Types: Cigarettes  . Smokeless tobacco: Never Used  . Alcohol Use: No  . Drug Use: No  . Sexually Active: Not Currently -- Female partner(s)   Other Topics Concern  . None   Social History Narrative   Lives alone.  One daughter is local, but is getting ready to move to [place], where her children live with their father.  The other daughter lives near [place], [place].   Family History  Problem Relation Age of Onset  . Kidney disease  Mother   . Heart disease Father   . Anuerysm Brother 29    brain  . Heart disease Brother   . Heart disease Sister 72    s/p CABG  . Hypertension Sister    Allergies  Allergen Reactions  . Amoxicillin     Oral yeast infection  . Chlorzoxazone     headache  . Codeine     headache  . Darvocet (Propoxyphene-Acetaminophen) Itching  . Dilaudid (Hydromorphone Hcl) Itching  . Flagyl (Metronidazole) Diarrhea  . Keflex (Cephalexin)   . Morphine And Related Itching  . Nitrofurantoin Monohyd Macro Hives  . Oxycodone-Acetaminophen Itching  . Percocet (Oxycodone-Acetaminophen) Itching  . Sulfa Antibiotics     Yeast infection in mouth   Prior to Admission medications   Medication Sig Start Date End Date Taking? Authorizing Provider  acetaminophen (TYLENOL) 500 MG tablet Take 1,000 mg by mouth every 6 (six) hours as needed. For pain   Yes Historical Provider, MD  albuterol (PROVENTIL HFA;VENTOLIN  HFA) 108 (90 BASE) MCG/ACT inhaler Inhale 2 puffs into the lungs every 6 (six) hours as needed for wheezing.   Yes Historical Provider, MD  ALPRAZolam Duanne Moron) 1 MG tablet Take 1 mg by mouth daily as needed. For nerves   Yes Historical Provider, MD  amLODipine (NORVASC) 5 MG tablet Take 5 mg by mouth at bedtime.    Yes Historical Provider, MD  benazepril (LOTENSIN) 20 MG tablet Take 20 mg by mouth daily.   Yes Historical Provider, MD  carisoprodol (SOMA) 350 MG tablet TAKE 1 TABLET BY MOUTH EVERY 8 HOURS AS NEEDED 03/09/13  Yes Meredith Staggers, MD  clonazePAM (KLONOPIN) 2 MG tablet Take 2 tablets (4 mg total) by mouth at bedtime. 03/09/13  Yes Meredith Staggers, MD  cloNIDine (CATAPRES) 0.1 MG tablet Take 0.1 mg by mouth 2 (two) times daily.   Yes Historical Provider, MD  diclofenac sodium (VOLTAREN) 1 % GEL Apply 2 g topically 3 (three) times daily as needed (for pain). Apply to right arm   Yes Historical Provider, MD  ESZOPICLONE 3 MG tablet 3 mg at bedtime.  09/28/12  Yes Historical Provider, MD  fentaNYL (DURAGESIC - DOSED MCG/HR) 75 MCG/HR Place 1 patch (75 mcg total) onto the skin every 3 (three) days. 03/02/13  Yes Santiago Glad Prueter, PA-C  hydrochlorothiazide (HYDRODIURIL) 12.5 MG tablet Take 12.5 mg by mouth at bedtime.    Yes Historical Provider, MD  HYDROcodone-acetaminophen (NORCO/VICODIN) 5-325 MG per tablet Take 1 tablet by mouth every 6 (six) hours as needed for pain. 03/02/13  Yes Santiago Glad Prueter, PA-C  levothyroxine (SYNTHROID, LEVOTHROID) 25 MCG tablet Take 25 mcg by mouth daily before breakfast.   Yes Historical Provider, MD  naproxen (NAPROSYN) 500 MG tablet Take 500 mg by mouth at bedtime as needed (for pain).  11/09/12  Yes Historical Provider, MD  omeprazole (PRILOSEC) 20 MG capsule Take 20 mg by mouth daily.   Yes Historical Provider, MD  OVER THE COUNTER MEDICATION Take 1 tablet by mouth daily. vegetables laxative   Yes Historical Provider, MD  OVER THE COUNTER MEDICATION Take 10 mLs by mouth  daily as needed (anti-diarrhea).   Yes Historical Provider, MD  Oxcarbazepine (TRILEPTAL) 300 MG tablet Take 300 mg by mouth 2 (two) times daily as needed.  11/21/12  Yes Historical Provider, MD  pravastatin (PRAVACHOL) 20 MG tablet Take 1 tablet (20 mg total) by mouth daily. 11/23/12  Yes Susy Frizzle, MD  promethazine (PHENERGAN) 25 MG tablet Take 25  mg by mouth daily as needed. For nausea   Yes Historical Provider, MD  valACYclovir (VALTREX) 500 MG tablet Take 500 mg by mouth daily.   Yes Historical Provider, MD  venlafaxine (EFFEXOR-XR) 150 MG 24 hr capsule Take 300 mg by mouth at bedtime.    Yes Historical Provider, MD  docusate sodium (COLACE) 100 MG capsule Take 100 mg by mouth at bedtime.    Historical Provider, MD  metroNIDAZOLE (METROGEL VAGINAL) 0.75 % vaginal gel Place 1 Applicatorful vaginally 2 (two) times daily. For 5 days 03/01/13   Alycia Rossetti, MD     ROS: The patient denies fevers, chills, night sweats, unintentional weight loss, chest pain, palpitations, wheezing, dyspnea on exertion, nausea, vomiting, abdominal pain, dysuria, hematuria, melena, numbness, weakness, or tingling.   All other systems have been reviewed and were otherwise negative with the exception of those mentioned in the HPI and as above.    PHYSICAL EXAM: Filed Vitals:   03/11/13 1250  BP: 118/82  Pulse: 98  Temp: 98.1 F (36.7 C)  Resp: 18   Filed Vitals:   03/11/13 1250  Height: 5\' 4"  (1.626 m)  Weight: 147 lb (66.679 kg)   Body mass index is 25.22 kg/(m^2).  General: Alert, no acute distress HEENT:  Normocephalic, atraumatic, oropharynx patent.  Cardiovascular:  Regular rate and rhythm, no rubs murmurs or gallops.  No Carotid bruits, radial pulse intact. No pedal edema.  Respiratory: Clear to auscultation bilaterally.  No wheezes, rales, or rhonchi.  No cyanosis, no use of accessory musculature GI: No organomegaly, abdomen is soft and non-tender, positive bowel sounds.  No  masses. Skin: No rashes. Neurologic: Facial musculature symmetric. Psychiatric: Patient is appropriate throughout our interaction. Lymphatic: No cervical lymphadenopathy Musculoskeletal: Gait intact. GU-no cervix, no masses, lesion, + flagyl cream   LABS: Results for orders placed in visit on 03/11/13  POCT UA - MICROSCOPIC ONLY      Result Value Range   WBC, Ur, HPF, POC 0-1     RBC, urine, microscopic 3-7     Bacteria, U Microscopic trace     Mucus, UA neg     Epithelial cells, urine per micros 0-3     Crystals, Ur, HPF, POC neg     Casts, Ur, LPF, POC neg     Yeast, UA neg    POCT URINALYSIS DIPSTICK      Result Value Range   Color, UA yellow     Clarity, UA clear     Glucose, UA neg     Bilirubin, UA neg     Ketones, UA neg     Spec Grav, UA >=1.030     Blood, UA neg     pH, UA 6.0     Protein, UA neg     Urobilinogen, UA 0.2     Nitrite, UA neg     Leukocytes, UA Negative    POCT CBC      Result Value Range   WBC 8.5  4.6 - 10.2 K/uL   Lymph, poc 2.9  0.6 - 3.4   POC LYMPH PERCENT 34.0  10 - 50 %L   MID (cbc) 0.6  0 - 0.9   POC MID % 6.5  0 - 12 %M   POC Granulocyte 5.1  2 - 6.9   Granulocyte percent 59.5  37 - 80 %G   RBC 4.30  4.04 - 5.48 M/uL   Hemoglobin 13.2  12.2 - 16.2 g/dL   HCT, POC 42.3  37.7 - 47.9 %   MCV 98.3 (*) 80 - 97 fL   MCH, POC 30.7  27 - 31.2 pg   MCHC 31.2 (*) 31.8 - 35.4 g/dL   RDW, POC 13.7     Platelet Count, POC 271  142 - 424 K/uL   MPV 8.9  0 - 99.8 fL  POCT WET PREP WITH KOH      Result Value Range   Trichomonas, UA Negative     Clue Cells Wet Prep HPF POC neg     Epithelial Wet Prep HPF POC 0-2     Yeast Wet Prep HPF POC neg     Bacteria Wet Prep HPF POC trace     RBC Wet Prep HPF POC 15-20     WBC Wet Prep HPF POC neg     KOH Prep POC Negative       EKG/XRAY:   Primary read interpreted by Dr. Marin Comment at [facility]. + large stool burden No obstruction, no free air Hardware present  ASSESSMENT/PLAN: Encounter Diagnoses   Name Primary?  . Dysuria Yes  . Pain in joint, pelvic region and thigh, unspecified laterality   . Abdominal pain, unspecified site     Rx Pyrdium C/w Flagyl cream F/u prn after get labs for Kem Parkinson PHUONG, DO 03/11/2013 2:38 PM

## 2013-03-12 LAB — URINE CULTURE
Colony Count: NO GROWTH
Organism ID, Bacteria: NO GROWTH

## 2013-03-12 LAB — GC/CHLAMYDIA PROBE AMP
CT Probe RNA: NEGATIVE
GC Probe RNA: NEGATIVE

## 2013-03-12 MED ORDER — CIPROFLOXACIN HCL 500 MG PO TABS: 500.0000 mg | ORAL_TABLET | Freq: Two times a day (BID) | ORAL | Status: DC

## 2013-03-12 NOTE — Telephone Encounter (Signed)
Med refilled.

## 2013-03-27 ENCOUNTER — Other Ambulatory Visit: Payer: Self-pay | Admitting: Family Medicine

## 2013-03-27 MED ORDER — AMLODIPINE BESYLATE 5 MG PO TABS: 5.0000 mg | ORAL_TABLET | Freq: Every day | ORAL | Status: DC

## 2013-03-27 NOTE — Telephone Encounter (Signed)
Rx Refilled  

## 2013-03-31 ENCOUNTER — Encounter
Payer: PRIVATE HEALTH INSURANCE | Attending: Physical Medicine and Rehabilitation | Admitting: Physical Medicine and Rehabilitation

## 2013-03-31 ENCOUNTER — Encounter: Payer: Self-pay | Admitting: Physical Medicine and Rehabilitation

## 2013-03-31 ENCOUNTER — Other Ambulatory Visit: Payer: Self-pay | Admitting: Family Medicine

## 2013-03-31 VITALS — BP 134/87 | HR 101 | Resp 16 | Ht 63.0 in | Wt 145.0 lb

## 2013-03-31 DIAGNOSIS — M25559 Pain in unspecified hip: Secondary | ICD-10-CM | POA: Insufficient documentation

## 2013-03-31 DIAGNOSIS — M75101 Unspecified rotator cuff tear or rupture of right shoulder, not specified as traumatic: Secondary | ICD-10-CM

## 2013-03-31 DIAGNOSIS — IMO0002 Reserved for concepts with insufficient information to code with codable children: Secondary | ICD-10-CM

## 2013-03-31 DIAGNOSIS — M170 Bilateral primary osteoarthritis of knee: Secondary | ICD-10-CM

## 2013-03-31 DIAGNOSIS — M67919 Unspecified disorder of synovium and tendon, unspecified shoulder: Secondary | ICD-10-CM

## 2013-03-31 DIAGNOSIS — G8929 Other chronic pain: Secondary | ICD-10-CM | POA: Insufficient documentation

## 2013-03-31 DIAGNOSIS — M1710 Unilateral primary osteoarthritis, unspecified knee: Secondary | ICD-10-CM | POA: Insufficient documentation

## 2013-03-31 DIAGNOSIS — G4730 Sleep apnea, unspecified: Secondary | ICD-10-CM | POA: Insufficient documentation

## 2013-03-31 DIAGNOSIS — G2581 Restless legs syndrome: Secondary | ICD-10-CM | POA: Insufficient documentation

## 2013-03-31 DIAGNOSIS — Z5181 Encounter for therapeutic drug level monitoring: Secondary | ICD-10-CM

## 2013-03-31 DIAGNOSIS — M961 Postlaminectomy syndrome, not elsewhere classified: Secondary | ICD-10-CM

## 2013-03-31 MED ORDER — FENTANYL 75 MCG/HR TD PT72: 1.0000 | MEDICATED_PATCH | TRANSDERMAL | Status: DC

## 2013-03-31 NOTE — Patient Instructions (Signed)
Try to stay as active as tolerated 

## 2013-03-31 NOTE — Progress Notes (Signed)
Subjective:    Patient ID: Jill Phillips, female    DOB: 06/07/1956, 57 y.o.   MRN: [id number]  HPI The patient complains about chronic LBP , and restless leg syndrome bilateral .  The problem has been stable.Patient reports that she will schedule a sleep study soon. She also complains about RLS, which also has been stable. Patient is managing with her medication, some exercises and resting. Hx. Of multiple back surgeries, with PSF  Pain Inventory Average Pain 6 Pain Right Now 6 My pain is sharp  In the last 24 hours, has pain interfered with the following? General activity 3 Relation with others 3 Enjoyment of life 3 What TIME of day is your pain at its worst? morning,day time Sleep (in general) Good  Pain is worse with: walking and some activites Pain improves with: rest and medication Relief from Meds: 8  Mobility how many minutes can you walk? 15-20 transfers alone Do you have any goals in this area?  yes  Function I need assistance with the following:  household duties Do you have any goals in this area?  no  Neuro/Psych spasms  Prior Studies Any changes since last visit?  no  Physicians involved in your care Any changes since last visit?  no   Family History  Problem Relation Age of Onset  . Kidney disease Mother   . Heart disease Father   . Anuerysm Brother 29    brain  . Heart disease Brother   . Heart disease Sister 62    s/p CABG  . Hypertension Sister    History   Social History  . Marital Status: Divorced    Spouse Name: n/a    Number of Children: 2  . Years of Education: 12+   Occupational History  . disability     back surgeries   Social History Main Topics  . Smoking status: Current Every Day Smoker -- 1.50 packs/day for 45 years    Types: Cigarettes  . Smokeless tobacco: Never Used  . Alcohol Use: No  . Drug Use: No  . Sexually Active: Not Currently -- Female partner(s)   Other Topics Concern  . None   Social History  Narrative   Lives alone.  One daughter is local, but is getting ready to move to [place], where her children live with their father.  The other daughter lives near [place], [place].   Past Surgical History  Procedure Laterality Date  . Appendectomy    . Abdominal hysterectomy    . Tubal ligation    . Spine surgery      thoracic x 1,  lumbar x 15  . Right hip replacement    . Knee surgeries r knee      arthroscopy- right  . Hammer toe surgery    . Sleep apnea surgery    . Cholecystectomy    . Back surgery    . Joint replacement    . Total knee arthroplasty  05/29/2012    Procedure: TOTAL KNEE ARTHROPLASTY;  Surgeon: Mcarthur Rossetti, MD;  Location: [facility];  Service: Orthopedics;  Laterality: Right;  Right Total Knee Arthroplasty   Past Medical History  Diagnosis Date  . Postlaminectomy syndrome, thoracic region   . Osteoarthrosis, unspecified whether generalized or localized, lower leg   . Dysthymic disorder   . Calcifying tendinitis of shoulder   . Pain in joint, upper arm   . Chronic pain syndrome   . Lumbago   . Primary localized osteoarthrosis,  lower leg   . Hypertension   . Hyperlipidemia   . GERD (gastroesophageal reflux disease)   . Thyroid disease   . Restless leg syndrome   . PONV (postoperative nausea and vomiting)   . Heart murmur   . Sleep apnea     s/p surgery- last sleep study 2011- doesnt use oxygen or machine at night as instructed  . History of blood transfusion 1980  . Hypothyroidism   . Active smoker    BP 134/87  Pulse 101  Resp 16  Ht 5\' 3"  (1.6 m)  Wt 145 lb (65.772 kg)  BMI 25.69 kg/m2  SpO2 96%     Review of Systems  Constitutional: Positive for appetite change.  Gastrointestinal: Positive for constipation.  Neurological:       Spasms  All other systems reviewed and are negative.       Objective:   Physical Exam Constitutional: She is oriented to person, place, and time. She appears well-developed and well-nourished.  HENT:   Head: Normocephalic.  Musculoskeletal: She exhibits tenderness.  Neurological: She is alert and oriented to person, place, and time.  Skin: Skin is warm and dry.  Psychiatric: She has a normal mood and affect.  Symmetric normal motor tone is noted throughout. Normal muscle bulk. Muscle testing reveals 5/5 muscle strength of the upper extremity, and 5/5 of the lower extremity. Full range of motion in upper and lower extremities, except decrease ROM in right knee, extension deficit. ROM of spine is restricted. Fine motor movements are normal in both hands.  DTR in the upper and lower extremity are present and symmetric 2+, except left patella reflex 0. No clonus is noted.  Patient arises from chair with mild difficulty. Narrow based gait with normal arm swing bilateral.        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 Ninfa Linden, 05/29/2012, right knee is almost pain free.  2.Post laminectomy syndrome  3.restless leg syndrome  4. Bilateral hip pain  5. Hx of Sleep Apnea, patient is not using a C-PAP, has some symptoms of sleep apnea. States that she will schedule a sleep study soon.  Plan :  1. Discussed appropriate sleeping habits, ice to right shoulder..  2. We will continue with Klonopin for now--consider day time dosing.  3.Patient does not need a refill for her Hydrocodone 5/325 # 45,she just filled it. Refilled Fentanyl Duragesic at 75 mcg 1 q.72h hours #10 each without a refill.  4. Follow up with PA in one month.  Advised patient to continue with staying as active as possible, also she should look into aquatic exercises.Advised patient to follow up with her orthopedic surgeon who suggested a shoe lift for her left leg, she will follow up with him next week .  Follow up in 1 month.

## 2013-04-06 ENCOUNTER — Other Ambulatory Visit: Payer: Self-pay | Admitting: Family Medicine

## 2013-04-06 NOTE — Telephone Encounter (Signed)
Medication refilled per protocol. 

## 2013-04-22 ENCOUNTER — Telehealth: Payer: Self-pay | Admitting: Family Medicine

## 2013-04-22 ENCOUNTER — Encounter: Payer: Self-pay | Admitting: Family Medicine

## 2013-04-22 MED ORDER — LEVOTHYROXINE SODIUM 25 MCG PO TABS: 25.0000 ug | ORAL_TABLET | Freq: Every day | ORAL | Status: DC

## 2013-04-22 MED ORDER — BENAZEPRIL HCL 20 MG PO TABS: 20.0000 mg | ORAL_TABLET | Freq: Every day | ORAL | Status: DC

## 2013-04-22 MED ORDER — CLONIDINE HCL 0.1 MG PO TABS: 0.1000 mg | ORAL_TABLET | Freq: Three times a day (TID) | ORAL | Status: DC

## 2013-04-22 MED ORDER — VALACYCLOVIR HCL 500 MG PO TABS: 500.0000 mg | ORAL_TABLET | Freq: Every day | ORAL | Status: DC

## 2013-04-22 NOTE — Telephone Encounter (Signed)
Clonidine HCL 0.1 mg tab 1 TID #90

## 2013-04-22 NOTE — Telephone Encounter (Signed)
Refill Levothyroxin 25 mcg,  Benazepril 20 mg, Valcyclovir 500mg  Medication refill for one time only.  Patient needs to be seen.  Letter sent for patient to call and schedule

## 2013-04-22 NOTE — Telephone Encounter (Signed)
Rx Refilled  

## 2013-04-29 ENCOUNTER — Encounter
Payer: PRIVATE HEALTH INSURANCE | Attending: Physical Medicine and Rehabilitation | Admitting: Physical Medicine and Rehabilitation

## 2013-04-29 ENCOUNTER — Encounter: Payer: Self-pay | Admitting: Physical Medicine and Rehabilitation

## 2013-04-29 VITALS — BP 157/93 | HR 112 | Resp 16 | Ht 63.0 in | Wt 148.0 lb

## 2013-04-29 DIAGNOSIS — Z79899 Other long term (current) drug therapy: Secondary | ICD-10-CM | POA: Insufficient documentation

## 2013-04-29 DIAGNOSIS — M67919 Unspecified disorder of synovium and tendon, unspecified shoulder: Secondary | ICD-10-CM

## 2013-04-29 DIAGNOSIS — M25559 Pain in unspecified hip: Secondary | ICD-10-CM | POA: Insufficient documentation

## 2013-04-29 DIAGNOSIS — M170 Bilateral primary osteoarthritis of knee: Secondary | ICD-10-CM

## 2013-04-29 DIAGNOSIS — G2581 Restless legs syndrome: Secondary | ICD-10-CM | POA: Insufficient documentation

## 2013-04-29 DIAGNOSIS — M1710 Unilateral primary osteoarthritis, unspecified knee: Secondary | ICD-10-CM | POA: Insufficient documentation

## 2013-04-29 DIAGNOSIS — Z96659 Presence of unspecified artificial knee joint: Secondary | ICD-10-CM | POA: Insufficient documentation

## 2013-04-29 DIAGNOSIS — G4730 Sleep apnea, unspecified: Secondary | ICD-10-CM | POA: Insufficient documentation

## 2013-04-29 DIAGNOSIS — M75101 Unspecified rotator cuff tear or rupture of right shoulder, not specified as traumatic: Secondary | ICD-10-CM

## 2013-04-29 DIAGNOSIS — IMO0002 Reserved for concepts with insufficient information to code with codable children: Secondary | ICD-10-CM

## 2013-04-29 DIAGNOSIS — M961 Postlaminectomy syndrome, not elsewhere classified: Secondary | ICD-10-CM | POA: Insufficient documentation

## 2013-04-29 MED ORDER — HYDROCODONE-ACETAMINOPHEN 5-325 MG PO TABS: 1.0000 | ORAL_TABLET | Freq: Four times a day (QID) | ORAL | Status: DC | PRN

## 2013-04-29 MED ORDER — FENTANYL 75 MCG/HR TD PT72: 1.0000 | MEDICATED_PATCH | TRANSDERMAL | Status: DC

## 2013-04-29 NOTE — Patient Instructions (Signed)
You could also try magnesium for your muscle symptoms

## 2013-04-29 NOTE — Progress Notes (Signed)
Subjective:    Patient ID: Jill Phillips, female    DOB: 05-14-1956, 57 y.o.   MRN: [id number]  HPI The patient complains about chronic LBP , and restless leg syndrome bilateral .  The problem has been stable.Patient reports that she will schedule a sleep study on September 19th. She also complains about RLS, which has gotten a little worse. Patient is managing with her medication, some exercises and resting.  Hx. Of multiple back surgeries, with PSF  Pain Inventory Average Pain 6 Pain Right Now 6 My pain is sharp  In the last 24 hours, has pain interfered with the following? General activity 5 Relation with others 4 Enjoyment of life 4 What TIME of day is your pain at its worst? morning and day Sleep (in general) Fair  Pain is worse with: walking and some activites Pain improves with: rest and medication Relief from Meds: 8  Mobility Do you have any goals in this area?  no  Function Do you have any goals in this area?  no  Neuro/Psych spasms  Prior Studies Any changes since last visit?  no  Physicians involved in your care Any changes since last visit?  no   Family History  Problem Relation Age of Onset  . Kidney disease Mother   . Heart disease Father   . Anuerysm Brother 29    brain  . Heart disease Brother   . Heart disease Sister 62    s/p CABG  . Hypertension Sister    History   Social History  . Marital Status: Divorced    Spouse Name: n/a    Number of Children: 2  . Years of Education: 12+   Occupational History  . disability     back surgeries   Social History Main Topics  . Smoking status: Current Every Day Smoker -- 1.50 packs/day for 45 years    Types: Cigarettes  . Smokeless tobacco: Never Used  . Alcohol Use: No  . Drug Use: No  . Sexual Activity: Not Currently    Partners: Male   Other Topics Concern  . None   Social History Narrative   Lives alone.  One daughter is local, but is getting ready to move to [place], where her  children live with their father.  The other daughter lives near [place], [place].   Past Surgical History  Procedure Laterality Date  . Appendectomy    . Abdominal hysterectomy    . Tubal ligation    . Spine surgery      thoracic x 1,  lumbar x 15  . Right hip replacement    . Knee surgeries r knee      arthroscopy- right  . Hammer toe surgery    . Sleep apnea surgery    . Cholecystectomy    . Back surgery    . Joint replacement    . Total knee arthroplasty  05/29/2012    Procedure: TOTAL KNEE ARTHROPLASTY;  Surgeon: Mcarthur Rossetti, MD;  Location: [facility];  Service: Orthopedics;  Laterality: Right;  Right Total Knee Arthroplasty   Past Medical History  Diagnosis Date  . Postlaminectomy syndrome, thoracic region   . Osteoarthrosis, unspecified whether generalized or localized, lower leg   . Dysthymic disorder   . Calcifying tendinitis of shoulder   . Pain in joint, upper arm   . Chronic pain syndrome   . Lumbago   . Primary localized osteoarthrosis, lower leg   . Hypertension   . Hyperlipidemia   .  GERD (gastroesophageal reflux disease)   . Thyroid disease   . Restless leg syndrome   . PONV (postoperative nausea and vomiting)   . Heart murmur   . Sleep apnea     s/p surgery- last sleep study 2011- doesnt use oxygen or machine at night as instructed  . History of blood transfusion 1980  . Hypothyroidism   . Active smoker    BP 157/93  Pulse 112  Resp 16  Ht 5\' 3"  (1.6 m)  Wt 148 lb (67.132 kg)  BMI 26.22 kg/m2  SpO2 98%     Review of Systems  Neurological:       Spasms  All other systems reviewed and are negative.       Objective:   Physical Exam Constitutional: She is oriented to person, place, and time. She appears well-developed and well-nourished.  HENT:  Head: Normocephalic.  Musculoskeletal: She exhibits tenderness.  Neurological: She is alert and oriented to person, place, and time.  Skin: Skin is warm and dry.  Psychiatric: She has a normal  mood and affect.  Symmetric normal motor tone is noted throughout. Normal muscle bulk. Muscle testing reveals 5/5 muscle strength of the upper extremity, and 5/5 of the lower extremity. Full range of motion in upper and lower extremities, except decrease ROM in right knee, extension deficit. ROM of spine is restricted. Fine motor movements are normal in both hands.  DTR in the upper and lower extremity are present and symmetric 2+, except left patella reflex 0. No clonus is noted.  Patient arises from chair with mild difficulty. Narrow based gait with normal arm swing bilateral.        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 Ninfa Linden, 05/29/2012, right knee is almost pain free.  2.Post laminectomy syndrome  3.restless leg syndrome  4. Bilateral hip pain  5. Hx of Sleep Apnea, patient is not using a C-PAP, has some symptoms of sleep apnea. States that she has scheduled a sleep study on September 19th.   Plan :  1. Discussed appropriate sleeping habits.  2. We will continue with Klonopin for now--consider day time dosing.  3.Refilled  Hydrocodone 5/325 # 45.  Refilled Fentanyl Duragesic at 75 mcg 1 q.72h hours #10 each without a refill.  4. Follow up with Dr. Naaman Plummer in one month, to discuss other possible medications for her RLS.I advised her to let her iron level be checked next time she sees her PCP, because a low level of iron can cause or increase the RLS, also restful sleep is essential to decrease RLS sx, hopefully when she had her sleep study done and is using the appropriate C-PAP, her Sx will decrease also.  Advised patient to continue with staying as active as possible, also she should look into aquatic exercises .  Follow up in 1 month.

## 2013-05-06 ENCOUNTER — Ambulatory Visit (INDEPENDENT_AMBULATORY_CARE_PROVIDER_SITE_OTHER): Payer: Medicare Other | Admitting: Family Medicine

## 2013-05-06 VITALS — BP 120/82 | HR 96 | Temp 98.6°F | Resp 18 | Ht 64.0 in | Wt 144.0 lb

## 2013-05-06 DIAGNOSIS — E039 Hypothyroidism, unspecified: Secondary | ICD-10-CM

## 2013-05-06 DIAGNOSIS — R3129 Other microscopic hematuria: Secondary | ICD-10-CM

## 2013-05-06 DIAGNOSIS — R1032 Left lower quadrant pain: Secondary | ICD-10-CM

## 2013-05-06 LAB — POCT CBC
Granulocyte percent: 60.9 %G (ref 37–80)
HCT, POC: 42.3 % (ref 37.7–47.9)
Hemoglobin: 13.6 g/dL (ref 12.2–16.2)
Lymph, poc: 3.6 — AB (ref 0.6–3.4)
MCH, POC: 31.3 pg — AB (ref 27–31.2)
MCHC: 32.2 g/dL (ref 31.8–35.4)
MCV: 97.4 fL — AB (ref 80–97)
MID (cbc): 0.7 (ref 0–0.9)
MPV: 8.2 fL (ref 0–99.8)
POC Granulocyte: 6.7 (ref 2–6.9)
POC LYMPH PERCENT: 32.5 %L (ref 10–50)
POC MID %: 6.6 %M (ref 0–12)
Platelet Count, POC: 302 10*3/uL (ref 142–424)
RBC: 4.34 M/uL (ref 4.04–5.48)
RDW, POC: 14.6 %
WBC: 11 10*3/uL — AB (ref 4.6–10.2)

## 2013-05-06 LAB — POCT URINALYSIS DIPSTICK
Bilirubin, UA: NEGATIVE
Glucose, UA: NEGATIVE
Ketones, UA: NEGATIVE
Leukocytes, UA: NEGATIVE
Nitrite, UA: NEGATIVE
Protein, UA: NEGATIVE
Spec Grav, UA: 1.025
Urobilinogen, UA: 0.2
pH, UA: 6

## 2013-05-06 LAB — POCT UA - MICROSCOPIC ONLY
Bacteria, U Microscopic: NEGATIVE
Casts, Ur, LPF, POC: NEGATIVE
Crystals, Ur, HPF, POC: NEGATIVE
Mucus, UA: NEGATIVE
Yeast, UA: NEGATIVE

## 2013-05-06 MED ORDER — CIPROFLOXACIN HCL 750 MG PO TABS
750.0000 mg | ORAL_TABLET | Freq: Two times a day (BID) | ORAL | Status: DC
Start: 2013-05-06 — End: 2013-05-19

## 2013-05-06 NOTE — Progress Notes (Signed)
[facility] [address], [place] [address] [phone]  Date:  05/06/2013   Name:  Jill Phillips   DOB:  [birth year]   MRN:  [id number]  PCP:  Odette Fraction, MD    Chief Complaint: Headache, lab recheck and hsv outbreak   History of Present Illness:  Jill Phillips is a 57 y.o. 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  She does take valtrex daily, but thinks she notes the sx of an outbreak this am.  She does not get these very often.  She notes discomfort in her vagianl area.    She also notes a HA- "my head hurts me every single day" for the last 2 or 3 weeks.  The HA will come and go.   She has taken her BP medication and tylneol to try and help with the pain.   She is in pain management for chronic pain due to several issues.  Per records she received an rx for hydrocodone 5, #45 on 8/28.  Wears a fentanyl patch as well  "I just feel bad all the time, I sleep all the time."  Supposed to have a sleep study later on this month.    No dysuria, no vaginal discharge During exam noted mild tenderness in her LLQ.  She then stated she has noted this tenderness for about 2 days, no fever, eating ok.  No prior history of diverticulitis   Patient Active Problem List   Diagnosis Date Noted  . BV (bacterial vaginosis) 02/18/2013  . UTI (urinary tract infection) 02/18/2013  . Active smoker   . Arthritis of knee, right 05/29/2012  . Osteoarthritis of both knees 11/18/2011  . Rotator cuff syndrome of right shoulder 11/18/2011  . Lumbar post-laminectomy syndrome 11/18/2011  . Restless leg syndrome 11/18/2011    Past Medical History  Diagnosis Date  . Postlaminectomy syndrome, thoracic region   . Osteoarthrosis, unspecified whether generalized or localized, lower leg   . Dysthymic disorder   . Calcifying tendinitis of shoulder   . Pain in joint, upper arm   . Chronic  pain syndrome   . Lumbago   . Primary localized osteoarthrosis, lower leg   . Hypertension   . Hyperlipidemia   . GERD (gastroesophageal reflux disease)   . Thyroid disease   . Restless leg syndrome   . PONV (postoperative nausea and vomiting)   . Heart murmur   . Sleep apnea     s/p surgery- last sleep study 2011- doesnt use oxygen or machine at night as instructed  . History of blood transfusion 1980  . Hypothyroidism   . Active smoker     Past Surgical History  Procedure Laterality Date  . Appendectomy    . Abdominal hysterectomy    . Tubal ligation    . Spine surgery      thoracic x 1,  lumbar x 15  . Right hip replacement    . Knee surgeries r knee      arthroscopy- right  . Hammer toe surgery    . Sleep apnea surgery    . Cholecystectomy    . Back surgery    . Joint replacement    . Total knee arthroplasty  05/29/2012    Procedure: TOTAL KNEE ARTHROPLASTY;  Surgeon: Mcarthur Rossetti, MD;  Location: [facility];  Service: Orthopedics;  Laterality: Right;  Right  Total Knee Arthroplasty    History  Substance Use Topics  . Smoking status: Current Every Day Smoker -- 1.50 packs/day for 45 years    Types: Cigarettes  . Smokeless tobacco: Never Used  . Alcohol Use: No    Family History  Problem Relation Age of Onset  . Kidney disease Mother   . Heart disease Father   . Anuerysm Brother 29    brain  . Heart disease Brother   . Heart disease Sister 58    s/p CABG  . Hypertension Sister     Allergies  Allergen Reactions  . Amoxicillin     Oral yeast infection  . Chlorzoxazone     headache  . Codeine     headache  . Darvocet [Propoxyphene-Acetaminophen] Itching  . Dilaudid [Hydromorphone Hcl] Itching  . Flagyl [Metronidazole] Diarrhea  . Keflex [Cephalexin]   . Morphine And Related Itching  . Nitrofurantoin Monohyd Macro Hives  . Oxycodone-Acetaminophen Itching  . Percocet [Oxycodone-Acetaminophen] Itching  . Sulfa Antibiotics     Yeast infection in  mouth    Medication list has been reviewed and updated.  Current Outpatient Prescriptions on File Prior to Visit  Medication Sig Dispense Refill  . clonazePAM (KLONOPIN) 2 MG tablet Take 2 tablets (4 mg total) by mouth at bedtime.  60 tablet  3  . cloNIDine (CATAPRES) 0.1 MG tablet Take 1 tablet (0.1 mg total) by mouth 3 (three) times daily.  90 tablet  3  . diclofenac sodium (VOLTAREN) 1 % GEL Apply 2 g topically 3 (three) times daily as needed (for pain). Apply to right arm      . docusate sodium (COLACE) 100 MG capsule Take 100 mg by mouth at bedtime.      [name] ESZOPICLONE 3 MG tablet 3 mg at bedtime.       . fentaNYL (DURAGESIC - DOSED MCG/HR) 75 MCG/HR Place 1 patch (75 mcg total) onto the skin every 3 (three) days.  10 patch  0  . hydrochlorothiazide (HYDRODIURIL) 12.5 MG tablet Take 12.5 mg by mouth at bedtime.       [name] HYDROcodone-acetaminophen (NORCO/VICODIN) 5-325 MG per tablet Take 1 tablet by mouth every 6 (six) hours as needed for pain.  45 tablet  0  . levothyroxine (SYNTHROID, LEVOTHROID) 25 MCG tablet Take 1 tablet (25 mcg total) by mouth daily before breakfast.  30 tablet  0  . metroNIDAZOLE (FLAGYL) 500 MG tablet       . naproxen (NAPROSYN) 500 MG tablet Take 500 mg by mouth at bedtime as needed (for pain).       [name] omeprazole (PRILOSEC) 20 MG capsule TAKE 1 CAPSULE BY MOUTH ONCE DAILY.  30 capsule  11  . OVER THE COUNTER MEDICATION Take 1 tablet by mouth daily. vegetables laxative      . OVER THE COUNTER MEDICATION Take 10 mLs by mouth daily as needed (anti-diarrhea).      . Oxcarbazepine (TRILEPTAL) 300 MG tablet Take 300 mg by mouth 2 (two) times daily as needed.       . pravastatin (PRAVACHOL) 20 MG tablet Take 1 tablet (20 mg total) by mouth daily.  30 tablet  5  . promethazine (PHENERGAN) 25 MG tablet Take 25 mg by mouth daily as needed. For nausea      . valACYclovir (VALTREX) 500 MG tablet Take 1 tablet (500 mg total) by mouth daily.  30 tablet  0  . venlafaxine  (EFFEXOR-XR) 150 MG 24 hr capsule Take 300  mg by mouth at bedtime.       [name] acetaminophen (TYLENOL) 500 MG tablet Take 1,000 mg by mouth every 6 (six) hours as needed. For pain      . albuterol (PROVENTIL HFA;VENTOLIN HFA) 108 (90 BASE) MCG/ACT inhaler Inhale 2 puffs into the lungs every 6 (six) hours as needed for wheezing.      [name] ALPRAZolam (XANAX) 1 MG tablet Take 1 mg by mouth daily as needed. For nerves      . amLODipine (NORVASC) 5 MG tablet TAKE 1 TABLET BY MOUTH ONCE DAILY.  30 tablet  5  . benazepril (LOTENSIN) 20 MG tablet Take 1 tablet (20 mg total) by mouth daily.  30 tablet  0  . carisoprodol (SOMA) 350 MG tablet TAKE 1 TABLET BY MOUTH EVERY 8 HOURS AS NEEDED  90 tablet  3  . metroNIDAZOLE (METROGEL VAGINAL) 0.75 % vaginal gel Place 1 Applicatorful vaginally 2 (two) times daily. For 5 days  70 g  0  . phenazopyridine (PYRIDIUM) 200 MG tablet Take 1 tablet (200 mg total) by mouth 3 (three) times daily as needed for pain.  10 tablet  0   No current facility-administered medications on file prior to visit.    Review of Systems:  As per HPI- otherwise negative.   Physical Examination: Filed Vitals:   05/06/13 1446  BP: 120/82  Pulse: 96  Temp: 98.6 F (37 C)  Resp: 18   Filed Vitals:   05/06/13 1446  Height: 5\' 4"  (1.626 m)  Weight: 144 lb (65.318 kg)   Body mass index is 24.71 kg/(m^2). Ideal Body Weight: Weight in (lb) to have BMI = 25: 145.3  GEN: WDWN, NAD, Non-toxic, A & O x 3, tobacco odor HEENT: Atraumatic, Normocephalic. Neck supple. No masses, No LAD. Ears and Nose: No external deformity. CV: RRR, No M/G/R. No JVD. No thrill. No extra heart sounds. PULM: CTA B, no wheezes, crackles, rhonchi. No retractions. No resp. distress. No accessory muscle use. ABD: S,  ND, +BS. No rebound. No HSM.  Minimal LLQ tenderness  EXTR: No c/c/e NEURO Normal gait.  PSYCH: Normally interactive. Conversant. Not depressed or anxious appearing.  Calm demeanor.  GU: normal exam,  s/p hysterectomy.  No lesions or vesicles to suggest an HSV outbreak.  Normal vaginal canal.   Results for orders placed in visit on 05/06/13  POCT CBC      Result Value Range   WBC 11.0 (*) 4.6 - 10.2 K/uL   Lymph, poc 3.6 (*) 0.6 - 3.4   POC LYMPH PERCENT 32.5  10 - 50 %L   MID (cbc) 0.7  0 - 0.9   POC MID % 6.6  0 - 12 %M   POC Granulocyte 6.7  2 - 6.9   Granulocyte percent 60.9  37 - 80 %G   RBC 4.34  4.04 - 5.48 M/uL   Hemoglobin 13.6  12.2 - 16.2 g/dL   HCT, POC 42.3  37.7 - 47.9 %   MCV 97.4 (*) 80 - 97 fL   MCH, POC 31.3 (*) 27 - 31.2 pg   MCHC 32.2  31.8 - 35.4 g/dL   RDW, POC 14.6     Platelet Count, POC 302  142 - 424 K/uL   MPV 8.2  0 - 99.8 fL  POCT UA - MICROSCOPIC ONLY      Result Value Range   WBC, Ur, HPF, POC 0-1     RBC, urine, microscopic 3-5  Bacteria, U Microscopic neg     Mucus, UA neg     Epithelial cells, urine per micros 1-8     Crystals, Ur, HPF, POC neg     Casts, Ur, LPF, POC neg     Yeast, UA neg    POCT URINALYSIS DIPSTICK      Result Value Range   Color, UA yellow     Clarity, UA clear     Glucose, UA neg     Bilirubin, UA neg     Ketones, UA neg     Spec Grav, UA 1.025     Blood, UA eng     pH, UA 6.0     Protein, UA neg     Urobilinogen, UA 0.2     Nitrite, UA neg     Leukocytes, UA Negative       Assessment and Plan: Abdominal pain, left lower quadrant - Plan: POCT CBC, Comprehensive metabolic panel, POCT UA - Microscopic Only, POCT urinalysis dipstick, ciprofloxacin (CIPRO) 750 MG tablet, Urine culture  Unspecified hypothyroidism - Plan: TSH  Microhematuria - Plan: Urine culture  Concern about possible HSV outbreak.  Will have her take her valtrex BID for the next 3 days, which is the recurrence dose.  At this time I do not see clear evidence of an outbreak but tx will not be harmful Await her CMP and TSH Microhematuria is concerning as she is a heavy and long- time smoker.  If negative urine culture will need urology  evaluation Abdominal pain: not mentioned prior to exam as it was not really bothering her.  It is possible that she has mild diverticulitis.  Offered a CT scan, but she has had several of these and prefers to avoid another if possible.  Will treat with cipro- avoid flagyl as she has difficulty tolerating it.  Close follow-up if not better or if getting worse.  Will plan further follow- up pending labs.   Signed Lamar Blinks, MD

## 2013-05-06 NOTE — Patient Instructions (Addendum)
Please take your valtrex twice a day for the next 3 days.    Use the cipro as directed for any possible abdominal or urine infection.  I will be in touch with the rest of your labs when they come in.   If you are getting worse or have any other problems please let me know!

## 2013-05-07 LAB — COMPREHENSIVE METABOLIC PANEL
ALT: 15 U/L (ref 0–35)
AST: 20 U/L (ref 0–37)
Albumin: 5 g/dL (ref 3.5–5.2)
Alkaline Phosphatase: 91 U/L (ref 39–117)
BUN: 26 mg/dL — ABNORMAL HIGH (ref 6–23)
CO2: 33 mEq/L — ABNORMAL HIGH (ref 19–32)
Calcium: 9.9 mg/dL (ref 8.4–10.5)
Chloride: 101 mEq/L (ref 96–112)
Creat: 1 mg/dL (ref 0.50–1.10)
Glucose, Bld: 78 mg/dL (ref 70–99)
Potassium: 3.8 mEq/L (ref 3.5–5.3)
Sodium: 140 mEq/L (ref 135–145)
Total Bilirubin: 0.4 mg/dL (ref 0.3–1.2)
Total Protein: 7.8 g/dL (ref 6.0–8.3)

## 2013-05-07 LAB — URINE CULTURE
Colony Count: NO GROWTH
Organism ID, Bacteria: NO GROWTH

## 2013-05-07 LAB — TSH: TSH: 3.406 u[IU]/mL (ref 0.350–4.500)

## 2013-05-09 ENCOUNTER — Encounter: Payer: Self-pay | Admitting: Family Medicine

## 2013-05-09 NOTE — Addendum Note (Signed)
Addended by: Lamar Blinks C on: 05/09/2013 09:28 PM   Modules accepted: Orders

## 2013-05-10 ENCOUNTER — Telehealth: Payer: Self-pay | Admitting: Family Medicine

## 2013-05-10 NOTE — Telephone Encounter (Signed)
Called and LMOM.  We are going to refer her to urology due to microhematuria.  Let us know if any questions

## 2013-05-19 ENCOUNTER — Ambulatory Visit (INDEPENDENT_AMBULATORY_CARE_PROVIDER_SITE_OTHER): Payer: PRIVATE HEALTH INSURANCE | Admitting: Internal Medicine

## 2013-05-19 ENCOUNTER — Encounter: Payer: Self-pay | Admitting: Internal Medicine

## 2013-05-19 VITALS — BP 121/80 | HR 76 | Temp 98.7°F | Ht 62.75 in | Wt 147.8 lb

## 2013-05-19 DIAGNOSIS — E1169 Type 2 diabetes mellitus with other specified complication: Secondary | ICD-10-CM | POA: Insufficient documentation

## 2013-05-19 DIAGNOSIS — F17200 Nicotine dependence, unspecified, uncomplicated: Secondary | ICD-10-CM

## 2013-05-19 DIAGNOSIS — E1159 Type 2 diabetes mellitus with other circulatory complications: Secondary | ICD-10-CM | POA: Insufficient documentation

## 2013-05-19 DIAGNOSIS — Z0000 Encounter for general adult medical examination without abnormal findings: Secondary | ICD-10-CM

## 2013-05-19 DIAGNOSIS — I10 Essential (primary) hypertension: Secondary | ICD-10-CM

## 2013-05-19 DIAGNOSIS — F32A Depression, unspecified: Secondary | ICD-10-CM | POA: Insufficient documentation

## 2013-05-19 DIAGNOSIS — F3289 Other specified depressive episodes: Secondary | ICD-10-CM

## 2013-05-19 DIAGNOSIS — F329 Major depressive disorder, single episode, unspecified: Secondary | ICD-10-CM

## 2013-05-19 DIAGNOSIS — E785 Hyperlipidemia, unspecified: Secondary | ICD-10-CM | POA: Insufficient documentation

## 2013-05-19 DIAGNOSIS — E039 Hypothyroidism, unspecified: Secondary | ICD-10-CM | POA: Insufficient documentation

## 2013-05-19 DIAGNOSIS — R3129 Other microscopic hematuria: Secondary | ICD-10-CM | POA: Insufficient documentation

## 2013-05-19 NOTE — Assessment & Plan Note (Signed)
Etiology for her microscopic hematuria is not clear. Currently patient does not have symptoms for UTI. She does not have abdominal pain or pain over flank area, indicating less likely to have kidney stone. Patient already had an appointment with the urologist on 9/23. Will followup the recommendation.

## 2013-05-19 NOTE — Assessment & Plan Note (Signed)
Patient's is taking 25 mcg Synthroid currently. Her recent TSH was normal. We'll continue current regimen.

## 2013-05-19 NOTE — Assessment & Plan Note (Signed)
  Assessment: Progress toward smoking cessation:    patient has been smoking 1.5 pack a day for nearly 43 years. She is not ready to quit smoking yet .  Barriers to progress toward smoking cessation:    lack of motivation Comments:   Plan: Instruction/counseling given:  I counseled patient on the dangers of tobacco use, advised patient to stop smoking, and reviewed strategies to maximize success. Educational resources provided:  [employer] account manager) brochure Self management tools provided:    Medications to assist with smoking cessation:   Patient agreed to the following self-care plans for smoking cessation:    Other plans: I did counseling and pt would like to consider to cut down on her smoking.

## 2013-05-19 NOTE — Assessment & Plan Note (Addendum)
BP Readings from Last 3 Encounters:  05/19/13 121/80  05/06/13 120/82  04/29/13 157/93    Lab Results  Component Value Date   NA 140 05/06/2013   K 3.8 05/06/2013   CREATININE 1.00 05/06/2013    Assessment: Blood pressure control: controlled Progress toward BP goal:  at goal Comments:   Plan: Medications:  continue current medications Educational resources provided: brochure Self management tools provided:   Other plans: it is well controled. Continue current regimen:  lotensin 20 mg daily, clonidine and hydrochlorothiazide 12.5 mg daily, amlodipine 5 mg daily.

## 2013-05-19 NOTE — Assessment & Plan Note (Signed)
Patient is currently taking pravastatin 20 mg daily. Her liver function were normal on 9/14. Will need to get her previous record about the lipid profile before repeating it.

## 2013-05-19 NOTE — Progress Notes (Signed)
Patient ID: Jill Phillips, female   DOB: 06-05-1956, 57 y.o.   MRN: [id number] Subjective:   Patient ID: Jill Phillips female   DOB: May 25, 1956 57 y.o.   MRN: [id number]  CC:   To establish care HPI:  [name] is a 57 y.o. lady with past medical history as outlined below, who presents for establishing care with our clinic.  Patient has been seen by Dr. Dennard Phillips in [facility] in the past 8 years. Since Dr. Samella Phillips office is far away from her home, she would like to switch her care to [place].   1. HTN: Patient is currently taking lotensin 20 mg daily, clonidine and hydrochlorothiazide 12.5 mg daily, amlodipine 5 mg daily. Today her blood pressure is normal, 121/80. She does not have chest pain, shortness of breath, palpitation or leg edema. Her Cre and K were normal on CMP done on 05/06/13.  2. Hematuria-laparoscopic:  recently due to suprapubic tenderness and increased urinary frequency, patient was evaluated in the urgent care. She was treated empirically with ciprofloxacin for UTI and did urine analysis. She received a letter from Dr. Janett Phillips, informing her that her urinalysis is negative for infection, but with microscopic hematuria. She was given referral to neurologist, Dr. Risa Phillips. She has an appointment with Dr. Risa Phillips on 05/25/13. Patient did not noticed any gross blood in her urine. Currently, she does not have abdominal pain. She does not have pain in her flank area. No dysuria, increased urinary frequency or burning. No fever or chills.  3. Hypothyroidism: Patient is currently taking Synthroid 25 mcg daily. Her recent TSH was normal at 3.406 on 9/14.  4. OSA: Patient has history of severe obstructive sleep apnea. She had a surgery 2005. However her symptoms have not improved significantly. She reports that she still snores a lot in the night. She feels tired in the morning. She has CPAP machine at home, but did not use it consistently. She was scheduled for another sleep study on  05/21/13 by her doctor.   5. Depression: it is stable, patient does not feel depressed. No suicidal, or homicidal ideation today.  6. HLD:  Patient's currently taking pravastatin 20 mg daily. She did not noticed any side effects, such as muscle pain. Her AST and ALT were normal on 9/14.   ROS:  Denies fever, chills, headaches, cough, chest pain, SOB, abdominal pain, diarrhea, constipation, dysuria, urgency, frequency, hematuria.    Past Medical History  Diagnosis Date  . Postlaminectomy syndrome, thoracic region   . Osteoarthrosis, unspecified whether generalized or localized, lower leg   . Dysthymic disorder   . Calcifying tendinitis of shoulder   . Pain in joint, upper arm   . Chronic pain syndrome   . Lumbago   . Primary localized osteoarthrosis, lower leg   . Hypertension   . Hyperlipidemia   . GERD (gastroesophageal reflux disease)   . Thyroid disease   . Restless leg syndrome   . PONV (postoperative nausea and vomiting)   . Heart murmur   . Sleep apnea     s/p surgery- last sleep study 2011- doesnt use oxygen or machine at night as instructed  . History of blood transfusion 1980  . Hypothyroidism   . Active smoker    Current Outpatient Prescriptions  Medication Sig Dispense Refill  . acetaminophen (TYLENOL) 500 MG tablet Take 1,000 mg by mouth every 6 (six) hours as needed. For pain      . amLODipine (NORVASC) 5 MG tablet TAKE 1  TABLET BY MOUTH ONCE DAILY.  30 tablet  5  . benazepril (LOTENSIN) 20 MG tablet Take 1 tablet (20 mg total) by mouth daily.  30 tablet  0  . carisoprodol (SOMA) 350 MG tablet TAKE 1 TABLET BY MOUTH EVERY 8 HOURS AS NEEDED  90 tablet  3  . clonazePAM (KLONOPIN) 2 MG tablet Take 2 tablets (4 mg total) by mouth at bedtime.  60 tablet  3  . cloNIDine (CATAPRES) 0.1 MG tablet Take 1 tablet (0.1 mg total) by mouth 3 (three) times daily.  90 tablet  3  . diclofenac sodium (VOLTAREN) 1 % GEL Apply 2 g topically 3 (three) times daily as needed (for pain).  Apply to right arm      . docusate sodium (COLACE) 100 MG capsule Take 100 mg by mouth at bedtime.      [name] ESZOPICLONE 3 MG tablet 3 mg at bedtime.       . fentaNYL (DURAGESIC - DOSED MCG/HR) 75 MCG/HR Place 1 patch (75 mcg total) onto the skin every 3 (three) days.  10 patch  0  . hydrochlorothiazide (HYDRODIURIL) 12.5 MG tablet Take 12.5 mg by mouth at bedtime.       [name] HYDROcodone-acetaminophen (NORCO/VICODIN) 5-325 MG per tablet Take 1 tablet by mouth every 6 (six) hours as needed for pain.  45 tablet  0  . levothyroxine (SYNTHROID, LEVOTHROID) 25 MCG tablet Take 1 tablet (25 mcg total) by mouth daily before breakfast.  30 tablet  0  . naproxen (NAPROSYN) 500 MG tablet Take 500 mg by mouth at bedtime as needed (for pain).       [name] omeprazole (PRILOSEC) 20 MG capsule TAKE 1 CAPSULE BY MOUTH ONCE DAILY.  30 capsule  11  . OVER THE COUNTER MEDICATION Take 1 tablet by mouth daily. vegetables laxative      . OVER THE COUNTER MEDICATION Take 10 mLs by mouth daily as needed (anti-diarrhea).      . Oxcarbazepine (TRILEPTAL) 300 MG tablet Take 300 mg by mouth 2 (two) times daily as needed.       . pravastatin (PRAVACHOL) 20 MG tablet Take 1 tablet (20 mg total) by mouth daily.  30 tablet  5  . valACYclovir (VALTREX) 500 MG tablet Take 1 tablet (500 mg total) by mouth daily.  30 tablet  0  . venlafaxine (EFFEXOR-XR) 150 MG 24 hr capsule Take 300 mg by mouth at bedtime.        No current facility-administered medications for this visit.   Family History  Problem Relation Age of Onset  . Kidney disease Mother   . Heart disease Father   . Anuerysm Brother 29    brain  . Heart disease Brother   . Heart disease Sister 15    s/p CABG  . Hypertension Sister    History   Social History  . Marital Status: Divorced    Spouse Name: n/a    Number of Children: 2  . Years of Education: 12+   Occupational History  . disability     back surgeries   Social History Main Topics  . Smoking status:  Current Every Day Smoker -- 1.50 packs/day for 45 years    Types: Cigarettes  . Smokeless tobacco: Never Used  . Alcohol Use: No  . Drug Use: No  . Sexual Activity: Not Currently    Partners: Male   Other Topics Concern  . None   Social History Narrative   Lives alone.  One daughter is local, but is getting ready to move to [place], where her children live with their father.  The other daughter lives near [place], [place].    Review of Systems: Full 14-point review of systems otherwise negative. See HPI.   Objective:  Physical Exam: Filed Vitals:   05/19/13 1103  BP: 121/80  Pulse: 76  Temp: 98.7 F (37.1 C)  TempSrc: Oral  Height: 5' 2.75" (1.594 m)  Weight: 147 lb 12.8 oz (67.042 kg)  SpO2: 95%   Constitutional: Vital signs reviewed.  Patient is a well-developed and well-nourished, in no acute distress and cooperative with exam.   HEENT:  Head: Normocephalic and atraumatic Mouth: no erythema or exudates, MMM Eyes: PERRL, EOMI, conjunctivae normal, No scleral icterus.  Neck: Supple, Trachea midline normal ROM, No JVD,  Cardiovascular: RRR, S1 normal, S2 normal, patient reports having MV murmur due to MV prolapse, but I could not hear the murmur. Pulmonary/Chest: CTAB, no wheezes, rales, or rhonchi Abdominal: Soft. Non-tender, non-distended, bowel sounds are normal, no masses, organomegaly, or guarding present.  GU: no CVA tenderness Musculoskeletal: No joint deformities, erythema, or stiffness, ROM full and non-tender Hematology: no cervical, inginal, or axillary adenopathy.  Neurological: A&O x3, Strength is normal and symmetric bilaterally, cranial nerve II-XII are grossly intact, no focal motor deficit, sensory intact to light touch bilaterally.  Skin: Warm, dry and intact. No rash, cyanosis, or clubbing.  Psychiatric: Normal mood and affect. speech and behavior is normal.  Assessment & Plan:

## 2013-05-19 NOTE — Patient Instructions (Signed)
1. Please do not miss your appointment with urologist. 2. We will follow up your sleep study result. 3. Please take all medications as prescribed. 4. If you have worsening of your symptoms or new symptoms arise, please call the clinic [phone] or go to the ER immediately if symptoms are severe.  You have done great job in taking all your medications. I appreciate it very much. Please continue doing that.

## 2013-05-19 NOTE — Assessment & Plan Note (Signed)
-  patient had flu shot recently from her PCP's office -she refused Pap smear, mammogram, colonoscopy today. She would like to consider them in her next visit.

## 2013-05-19 NOTE — Assessment & Plan Note (Signed)
He is stable. Patient does not have symptoms. Will continue current regimen

## 2013-05-20 ENCOUNTER — Other Ambulatory Visit: Payer: Self-pay | Admitting: Physical Medicine & Rehabilitation

## 2013-05-20 ENCOUNTER — Other Ambulatory Visit: Payer: Self-pay | Admitting: Family Medicine

## 2013-05-20 NOTE — Progress Notes (Signed)
INTERNAL MEDICINE TEACHING ATTENDING ADDENDUM - Dominic Pea, DO, FACP: I reviewed with the resident Dr. Michelene Heady  medical history, physical examination, diagnosis and results of tests and treatment and I agree with the patient's care as documented.

## 2013-05-21 ENCOUNTER — Ambulatory Visit (INDEPENDENT_AMBULATORY_CARE_PROVIDER_SITE_OTHER): Payer: Medicare Other

## 2013-05-21 DIAGNOSIS — G4710 Hypersomnia, unspecified: Secondary | ICD-10-CM

## 2013-05-21 DIAGNOSIS — G4737 Central sleep apnea in conditions classified elsewhere: Secondary | ICD-10-CM

## 2013-05-22 ENCOUNTER — Other Ambulatory Visit: Payer: Self-pay | Admitting: Family Medicine

## 2013-05-24 ENCOUNTER — Other Ambulatory Visit: Payer: Self-pay | Admitting: Family Medicine

## 2013-05-28 ENCOUNTER — Encounter
Payer: PRIVATE HEALTH INSURANCE | Attending: Physical Medicine and Rehabilitation | Admitting: Physical Medicine & Rehabilitation

## 2013-05-28 ENCOUNTER — Encounter: Payer: Self-pay | Admitting: Physical Medicine & Rehabilitation

## 2013-05-28 VITALS — BP 133/77 | HR 92 | Resp 14 | Ht 63.0 in | Wt 150.6 lb

## 2013-05-28 DIAGNOSIS — M2170 Unequal limb length (acquired), unspecified site: Secondary | ICD-10-CM | POA: Insufficient documentation

## 2013-05-28 DIAGNOSIS — M67919 Unspecified disorder of synovium and tendon, unspecified shoulder: Secondary | ICD-10-CM | POA: Insufficient documentation

## 2013-05-28 DIAGNOSIS — IMO0002 Reserved for concepts with insufficient information to code with codable children: Secondary | ICD-10-CM | POA: Insufficient documentation

## 2013-05-28 DIAGNOSIS — M961 Postlaminectomy syndrome, not elsewhere classified: Secondary | ICD-10-CM

## 2013-05-28 DIAGNOSIS — M170 Bilateral primary osteoarthritis of knee: Secondary | ICD-10-CM

## 2013-05-28 DIAGNOSIS — M719 Bursopathy, unspecified: Secondary | ICD-10-CM | POA: Insufficient documentation

## 2013-05-28 DIAGNOSIS — G2581 Restless legs syndrome: Secondary | ICD-10-CM

## 2013-05-28 DIAGNOSIS — M1710 Unilateral primary osteoarthritis, unspecified knee: Secondary | ICD-10-CM

## 2013-05-28 DIAGNOSIS — M75101 Unspecified rotator cuff tear or rupture of right shoulder, not specified as traumatic: Secondary | ICD-10-CM

## 2013-05-28 MED ORDER — FENTANYL 75 MCG/HR TD PT72: 1.0000 | MEDICATED_PATCH | TRANSDERMAL | Status: DC

## 2013-05-28 MED ORDER — HYDROCODONE-ACETAMINOPHEN 5-325 MG PO TABS: 1.0000 | ORAL_TABLET | Freq: Four times a day (QID) | ORAL | Status: DC | PRN

## 2013-05-28 NOTE — Patient Instructions (Addendum)
PLEASE TRY LAXATIVE WITH SOFTENER, THREE TABS AT BED TIME  INCREASE YOUR FLUID INTAKE AND TRY TO EAT MORE FIBER.   WORK ON THE EXERCISES AND POSTURAL TECHNIQUES I PROVIDED YOU  TRY A QUARTER INCH HEEL INSERT FOR YOUR RIGHT SHOE.

## 2013-05-28 NOTE — Progress Notes (Signed)
Subjective:    Patient ID: Jill Phillips, female    DOB: 1956/06/29, 57 y.o.   MRN: [id number]  HPI   Jill Phillips is back regarding her chronic pain. She feels that he hydrocodone is constipating her. She takes laxatives, softeners each night.   She feels that her back is bothering her more.  It feels like spasms. Her spasms   Pain Inventory Average Pain 6 Pain Right Now 6 My pain is sharp  In the last 24 hours, has pain interfered with the following? General activity 4 Relation with others 6 Enjoyment of life 8 What TIME of day is your pain at its worst? morning daytime and night Sleep (in general) Poor  Pain is worse with: some activites Pain improves with: medication Relief from Meds: 7  Mobility walk without assistance  Function disabled: date disabled .  Neuro/Psych spasms anxiety  Prior Studies Any changes since last visit?  yes  Sleep study  Physicians involved in your care Any changes since last visit?  no   Family History  Problem Relation Age of Onset  . Kidney disease Mother   . Heart disease Father   . Anuerysm Brother 29    brain  . Heart disease Brother   . Heart disease Sister 82    s/p CABG  . Hypertension Sister    History   Social History  . Marital Status: Divorced    Spouse Name: n/a    Number of Children: 2  . Years of Education: 12+   Occupational History  . disability     back surgeries   Social History Main Topics  . Smoking status: Current Every Day Smoker -- 1.50 packs/day for 45 years    Types: Cigarettes  . Smokeless tobacco: Never Used  . Alcohol Use: No  . Drug Use: No  . Sexual Activity: Not Currently    Partners: Male   Other Topics Concern  . None   Social History Narrative   Lives alone.  One daughter is local, but is getting ready to move to [place], where her children live with their father.  The other daughter lives near [place], [place].   Past Surgical History  Procedure Laterality Date  . Appendectomy     . Abdominal hysterectomy    . Tubal ligation    . Spine surgery      thoracic x 1,  lumbar x 15  . Right hip replacement    . Knee surgeries r knee      arthroscopy- right  . Hammer toe surgery    . Sleep apnea surgery    . Cholecystectomy    . Back surgery    . Joint replacement    . Total knee arthroplasty  05/29/2012    Procedure: TOTAL KNEE ARTHROPLASTY;  Surgeon: Mcarthur Rossetti, MD;  Location: [facility];  Service: Orthopedics;  Laterality: Right;  Right Total Knee Arthroplasty   Past Medical History  Diagnosis Date  . Postlaminectomy syndrome, thoracic region   . Osteoarthrosis, unspecified whether generalized or localized, lower leg   . Dysthymic disorder   . Calcifying tendinitis of shoulder   . Pain in joint, upper arm   . Chronic pain syndrome   . Lumbago   . Primary localized osteoarthrosis, lower leg   . Hypertension   . Hyperlipidemia   . GERD (gastroesophageal reflux disease)   . Thyroid disease   . Restless leg syndrome   . PONV (postoperative nausea and vomiting)   . Heart  murmur   . Sleep apnea     s/p surgery- last sleep study 2011- doesnt use oxygen or machine at night as instructed  . History of blood transfusion 1980  . Hypothyroidism   . Active smoker    BP 133/77  Pulse 92  Resp 14  Ht 5\' 3"  (1.6 m)  Wt 150 lb 9.6 oz (68.312 kg)  BMI 26.68 kg/m2  SpO2 94%   Review of Systems  Constitutional: Positive for appetite change.  Gastrointestinal: Positive for constipation.  Musculoskeletal:       Spasms  Psychiatric/Behavioral: The patient is nervous/anxious.   All other systems reviewed and are negative.       Objective:   Physical Exam Constitutional: She is oriented to person, place, and time. She appears well-developed and well-nourished.  HENT:  Head: Normocephalic.  Eyes: EOM are normal. Pupils are equal, round, and reactive to light.  Neck: Normal range of motion.  Cardiovascular: Normal rate.  Pulmonary/Chest: Effort  normal.  Abdominal: Soft.  Musculoskeletal:  Right knee with minimal tenderness. Post-op scars noted. Knee rom 120+ with flexion. Weight bearing better with minimal antalgia seen.  Right shoulder with only mild tenderness with ROM. No shoulder instability noted. Had full AROM.  Generalized tenderness of c-spine with some limitation in ROM present Neurological: She is alert and oriented to person, place, and time.  Skin: Skin is warm.  Psychiatric: She has a normal mood and affect. Her behavior is normal. Judgment and thought content normal.    Assessment & Plan:   ASSESSMENT:  1. Chronic lumbar spine pain/post-lami syndrome  2. Osteoarthritis of the knees bilaterally, right greater than left.  3. Rotator cuff syndrome/subacromial bursitis.  4. Restless legs syndrome.  5. O2 dependent sleep apnea.  6. Tobacco abuse.  7. Right leg length discrepancy.    PLAN:  1. Leg length discrepancy will need to be addressed with at least a shoe insert. She needs to start with a .25inch heel insert to start with. She also needs to work on her regular rom and posture as well.  2. Continue with 4mg  klonopin at night. Discussed the possibility of trying horizant. Consider mysoline trial or requip retrial.  3. Continue trileptal during the day for any daytime RLS.  4. Discussed increase use of fiber, increased water intake a.  5. Refilled Duragesic at 75 mcg 1 q.72h hours #10 without a refill. She doesn't need the hydrocodone refilled today.  6. Follow up with me in 7 weeks. 30 minutes of face to face patient care time were spent during this visit. All questions were encouraged and answered.              Hydrocodone policy given to patient and reviewed changes with [facility] to CII and monthly visits required along with written rx

## 2013-06-01 ENCOUNTER — Telehealth: Payer: Self-pay

## 2013-06-01 NOTE — Telephone Encounter (Signed)
Patient called about her hydrocodone.  She was only given 45 pills and says she would like a 30 day supply.  Patient would like more pills.

## 2013-06-01 NOTE — Telephone Encounter (Signed)
i am willing to give her 60 pills per month initially in addition to the fentanyl she's taking. One of the reasons i did not increase these is because she felt they were causing constipation and didn't want further exacerbate that if it was the case.

## 2013-06-02 ENCOUNTER — Telehealth: Payer: Self-pay | Admitting: Neurology

## 2013-06-02 MED ORDER — HYDROCODONE-ACETAMINOPHEN 5-325 MG PO TABS: 1.0000 | ORAL_TABLET | Freq: Four times a day (QID) | ORAL | Status: DC | PRN

## 2013-06-02 NOTE — Telephone Encounter (Signed)
Please call and notify the patient that the recent sleep study did confirm the diagnosis of obstructive sleep apnea, but she also has what we call central sleep apnea. She may not do so well on CPAP only for this and may need a machine called BiPAP for treatment. I would recommend treatment for this in the form of PAP. This will require a repeat sleep study for proper titration and mask fitting. Since it has been 6 months since I have seen her, she will have to come back for an appt first, during which I can update her history and exam and also review her sleep test results and treatment options. Please explain to patient and arrange for FU appt. Thanks, Star Age, MD, PhD [facility])

## 2013-06-02 NOTE — Telephone Encounter (Signed)
Left voice mail for patient to return call to clinic.

## 2013-06-02 NOTE — Telephone Encounter (Signed)
An additional 45 were written

## 2013-06-02 NOTE — Telephone Encounter (Signed)
Called to pharmacy and Janne Lab notified by VM

## 2013-06-02 NOTE — Telephone Encounter (Signed)
Patient returned call and stated she forgot her vegetable laxative and believes that is what caused the constipation not hydrocodone. So the patient is wanting a 30 day supply of Hydrocodone.

## 2013-06-02 NOTE — Telephone Encounter (Signed)
Please send copy of PSG to Dr. Alger Simons, referring MD, thx.

## 2013-06-03 NOTE — Telephone Encounter (Signed)
This patient has been notified of her sleep study results and has scheduled a follow up appt with Dr. Rexene Alberts to discuss recommendations and treatment options.  A copy of this report has been forwarded to the referring provider and will be handed to the patient during her post study visit.

## 2013-06-04 ENCOUNTER — Ambulatory Visit (INDEPENDENT_AMBULATORY_CARE_PROVIDER_SITE_OTHER): Payer: Medicare Other | Admitting: Neurology

## 2013-06-04 ENCOUNTER — Encounter: Payer: Self-pay | Admitting: Neurology

## 2013-06-04 VITALS — BP 112/72 | HR 72 | Temp 98.0°F | Ht 64.0 in | Wt 147.0 lb

## 2013-06-04 DIAGNOSIS — G2581 Restless legs syndrome: Secondary | ICD-10-CM

## 2013-06-04 DIAGNOSIS — R0902 Hypoxemia: Secondary | ICD-10-CM

## 2013-06-04 DIAGNOSIS — F17200 Nicotine dependence, unspecified, uncomplicated: Secondary | ICD-10-CM

## 2013-06-04 DIAGNOSIS — G4733 Obstructive sleep apnea (adult) (pediatric): Secondary | ICD-10-CM

## 2013-06-04 DIAGNOSIS — G4737 Central sleep apnea in conditions classified elsewhere: Secondary | ICD-10-CM

## 2013-06-04 NOTE — Patient Instructions (Signed)
I will see you back after your second sleep study and hopefully will be able to prescribe a CPAP or BiPAP machine for you.

## 2013-06-04 NOTE — Progress Notes (Signed)
Subjective:    Patient ID: Jill Phillips is a 57 y.o. female.  HPI  Interim history:   Jill Phillips is a very pleasant 57 year old right-handed woman with a complex medical history of scoliosis, status post multiple back surgeries, right hip and right knee replacement surgeries, chronic pain on narcotic pain medication, RLS, reflux disease, thyroid disease, hyperlipidemia, and hypertension who presents for followup consultation of her sleep apnea after a recent sleep study. She is accompanied by her friend, Jill Phillips today.  I originally met her at the request of her pain Dr. on 12/03/2012, at which time I suggested a sleep study. She had a baseline sleep study on 05/21/2013 and I went over her test results with her in detail today. Her sleep efficiency was reduced mildly at 88.6% with a normal sleep latency of 15 minutes and wake after sleep onset of 39.5 minutes with moderate sleep fragmentation noted. She had a normal arousal index of 7.4 per hour. She had a high normal percentage of stage I sleep, an increased percentage of stage II sleep at 76.7%, absence of slow-wave sleep and a decreased percentage of REM sleep at 12.6% with a REM latency of 100.5 minutes which is normal. She had mild to moderate and at times loud snoring. She had no significant period leg movements. She had a total of 5 obstructive and 40 central apneas as well as 57 obstructive and 8 central hypopneas, which resulted in an elevated AHI of 15.7 per hour with further elevation to 23.2 per hour in the supine position. Of note, about 44% of all respiratory events were central in nature. I felt that this was in part due to narcotic pain medication. Her baseline oxygen saturation was only 89% with a nadir of 82%. Based on her central respiratory events I do not think she would be a great candidate for CPAP therapy but would most likely have to try and failed that before we can attempt bilevel  PAP treatment.  She has a decrease in her  Fentanyl patch to 75 mcg from 100 mcg. She has no new complaints.   Her Past Medical History Is Significant For: Past Medical History  Diagnosis Date  . Postlaminectomy syndrome, thoracic region   . Osteoarthrosis, unspecified whether generalized or localized, lower leg   . Dysthymic disorder   . Calcifying tendinitis of shoulder   . Pain in joint, upper arm   . Chronic pain syndrome   . Lumbago   . Primary localized osteoarthrosis, lower leg   . Hypertension   . Hyperlipidemia   . GERD (gastroesophageal reflux disease)   . Thyroid disease   . Restless leg syndrome   . PONV (postoperative nausea and vomiting)   . Heart murmur   . Sleep apnea     s/p surgery- last sleep study 2011- doesnt use oxygen or machine at night as instructed  . History of blood transfusion 1980  . Hypothyroidism   . Active smoker     Her Past Surgical History Is Significant For: Past Surgical History  Procedure Laterality Date  . Appendectomy    . Abdominal hysterectomy    . Tubal ligation    . Spine surgery      thoracic x 1,  lumbar x 15  . Right hip replacement    . Knee surgeries r knee      arthroscopy- right  . Hammer toe surgery    . Sleep apnea surgery    . Cholecystectomy    .  Back surgery    . Joint replacement    . Total knee arthroplasty  05/29/2012    Procedure: TOTAL KNEE ARTHROPLASTY;  Surgeon: Mcarthur Rossetti, MD;  Location: [facility];  Service: Orthopedics;  Laterality: Right;  Right Total Knee Arthroplasty    Her Family History Is Significant For: Family History  Problem Relation Age of Onset  . Kidney disease Mother   . Heart disease Father   . Anuerysm Brother 29    brain  . Heart disease Brother   . Heart disease Sister 61    s/p CABG  . Hypertension Sister     Her Social History Is Significant For: History   Social History  . Marital Status: Divorced    Spouse Name: n/a    Number of Children: 2  . Years of Education: 12+   Occupational History  .  disability     back surgeries   Social History Main Topics  . Smoking status: Current Every Day Smoker -- 1.50 packs/day for 45 years    Types: Cigarettes  . Smokeless tobacco: Never Used  . Alcohol Use: No  . Drug Use: No  . Sexual Activity: Not Currently    Partners: Male   Other Topics Concern  . None   Social History Narrative   Lives alone.  One daughter is local, but is getting ready to move to [place], where her children live with their father.  The other daughter lives near [place], [place].    Her Allergies Are:  Allergies  Allergen Reactions  . Amoxicillin     Oral yeast infection  . Chlorzoxazone     headache  . Codeine     headache  . Darvocet [Propoxyphene-Acetaminophen] Itching  . Dilaudid [Hydromorphone Hcl] Itching  . Flagyl [Metronidazole] Diarrhea  . Keflex [Cephalexin]   . Morphine And Related Itching  . Nitrofurantoin Monohyd Macro Hives  . Oxycodone-Acetaminophen Itching  . Percocet [Oxycodone-Acetaminophen] Itching  . Sulfa Antibiotics     Yeast infection in mouth  :   Her Current Medications Are:  Outpatient Encounter Prescriptions as of 06/04/2013  Medication Sig Dispense Refill  . acetaminophen (TYLENOL) 500 MG tablet Take 1,000 mg by mouth every 6 (six) hours as needed. For pain      . ALPRAZolam (XANAX) 1 MG tablet Take 1 tablet by mouth as needed.      [name] amLODipine (NORVASC) 5 MG tablet TAKE 1 TABLET BY MOUTH ONCE DAILY.  30 tablet  5  . benazepril (LOTENSIN) 20 MG tablet TAKE 1 TABLET BY MOUTH DAILY.  30 tablet  0  . carisoprodol (SOMA) 350 MG tablet TAKE 1 TABLET BY MOUTH EVERY 8 HOURS AS NEEDED  90 tablet  3  . clonazePAM (KLONOPIN) 2 MG tablet Take 2 tablets (4 mg total) by mouth at bedtime.  60 tablet  3  . cloNIDine (CATAPRES) 0.1 MG tablet Take 1 tablet (0.1 mg total) by mouth 3 (three) times daily.  90 tablet  3  . docusate sodium (COLACE) 100 MG capsule Take 100 mg by mouth at bedtime.      [name] ESZOPICLONE 3 MG tablet 3 mg at bedtime.        . fentaNYL (DURAGESIC - DOSED MCG/HR) 75 MCG/HR Place 1 patch (75 mcg total) onto the skin every 3 (three) days.  10 patch  0  . hydrochlorothiazide (HYDRODIURIL) 12.5 MG tablet Take 12.5 mg by mouth at bedtime.       [name] HYDROcodone-acetaminophen (NORCO/VICODIN) 5-325 MG per  tablet Take 1 tablet by mouth every 6 (six) hours as needed for pain.  45 tablet  0  . levothyroxine (SYNTHROID, LEVOTHROID) 25 MCG tablet TAKE 1 TABLET BY MOUTH DAILY BEFORE BREAKFAST.  30 tablet  0  . naproxen (NAPROSYN) 500 MG tablet Take 500 mg by mouth at bedtime as needed (for pain).       [name] omeprazole (PRILOSEC) 20 MG capsule TAKE 1 CAPSULE BY MOUTH ONCE DAILY.  30 capsule  11  . OVER THE COUNTER MEDICATION Take 1 tablet by mouth daily. vegetables laxative      . Oxcarbazepine (TRILEPTAL) 300 MG tablet Take 300 mg by mouth 2 (two) times daily as needed.       . pravastatin (PRAVACHOL) 20 MG tablet TAKE 1 TABLET BY MOUTH DAILY.  30 tablet  5  . promethazine (PHENERGAN) 25 MG tablet       . valACYclovir (VALTREX) 500 MG tablet TAKE 1 TABLET BY MOUTH DAILY.  30 tablet  0  . venlafaxine (EFFEXOR-XR) 150 MG 24 hr capsule Take 300 mg by mouth at bedtime.       . VOLTAREN 1 % GEL APPLY TO AFFECTED AREA 3 TIMES A DAY AS NEEDED.  300 g  3  . [DISCONTINUED] OVER THE COUNTER MEDICATION Take 10 mLs by mouth daily as needed (anti-diarrhea).       No facility-administered encounter medications on file as of 06/04/2013.    Review of Systems  Constitutional: Positive for appetite change and fatigue.  HENT: Positive for rhinorrhea.   Respiratory: Positive for wheezing.        Snoring  Gastrointestinal: Positive for constipation and blood in stool.  Genitourinary: Positive for hematuria.  Musculoskeletal: Positive for myalgias and joint swelling.       Cramps  Skin:       Itching, moles  Allergic/Immunologic: Positive for environmental allergies.  Neurological: Positive for headaches.       Restless leg  Hematological:  Bruises/bleeds easily.  Psychiatric/Behavioral: Positive for sleep disturbance and dysphoric mood. The patient is nervous/anxious.     Objective:  Neurologic Exam  Physical Exam Physical Examination:   Filed Vitals:   06/04/13 1047  BP: 112/72  Pulse: 72  Temp: 98 F (36.7 C)   General Examination: The patient is a very pleasant 57 y.o. female in no acute distress.  HEENT: Normocephalic, atraumatic, pupils are equal, round and reactive to light and accommodation. Extraocular tracking is good without nystagmus noted. Normal smooth pursuit is noted. Hearing is grossly intact. Face is symmetric with normal facial animation and normal facial sensation. Speech is clear with no dysarthria noted. There is no hypophonia. There is no lip, neck or jaw tremor. Neck is supple with full range of motion. There are no carotid bruits on auscultation. Oropharynx exam reveals normal findings. No significant airway crowding is noted. Mallampati is class I. Uvula and tonsils are absent.  Chest: is clear to auscultation without wheezing, rhonchi or crackles noted.  Heart: sounds are regular and normal without murmurs, rubs or gallops noted.   Abdomen: is soft, non-tender and non-distended with normal bowel sounds appreciated on auscultation.  Extremities: There is no pitting edema in the distal lower extremities bilaterally.   Skin: is warm and dry with no trophic changes noted.  Musculoskeletal: exam reveals: She has an abnormal posture or with significant kyphosis noted in the lower lumbar region and scoliosis, she leans to the right as well.  Neurologically:  Mental status: The patient is awake,  alert and oriented in all 4 spheres. Her memory, attention, language and knowledge are appropriate. There is no aphasia, agnosia, apraxia or anomia. Speech is clear with normal prosody and enunciation. Thought process is linear. Mood is congruent and affect is normal.   Cranial nerves are as described above  under HEENT exam. In addition, shoulder shrug is normal with equal shoulder height noted. Motor exam: Normal bulk, strength and tone is noted. There is no drift, tremor or rebound. Romberg is negative. Reflexes are 2+ throughout. Toes are downgoing bilaterally. Fine motor skills are intact with normal finger taps, normal hand movements, normal rapid alternating patting, normal foot taps and normal foot agility.  Cerebellar testing shows no dysmetria or intention tremor on finger to nose testing. there is no truncal or gait ataxia.  Sensory exam is intact to light touch, pinprick, vibration, temperature sense in the upper and lower extremities.  Gait, station and balance are significant for abnormal posture with a stoop and inability to straighten up completely. She leans to the right, rather, her upper body is tilted to the right. She walks with difficulty and a limp. She is briefly able to pull himself up on her toes and heels and tandem walk is difficult for her.  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 I do believe that her central sleep apnea is a large part related to her narcotic pain medication use. At this juncture I have asked her to come back for a full night titration study with positive airway treatment. I reiterated all the test results to her and explained the different treatment modalities for sleep apnea. Since she has obstructive as well as central sleep apnea she may be a better candidate for BiPAP rather than CPAP. She is willing to try positive airway treatment. To that end we will schedule her for a full night titration and hopefully I can prescribe positive airway pressure treatment for her. I will see her back after the sleep studies completed. She  was in agreement.

## 2013-06-18 ENCOUNTER — Other Ambulatory Visit: Payer: Self-pay | Admitting: Physician Assistant

## 2013-06-18 ENCOUNTER — Other Ambulatory Visit: Payer: Self-pay | Admitting: Family Medicine

## 2013-06-18 NOTE — Telephone Encounter (Signed)
Pt has transferred to new PCP.  Refills denied

## 2013-06-18 NOTE — Telephone Encounter (Signed)
Pt has transferred care to different PCP  Refills denied

## 2013-06-23 ENCOUNTER — Other Ambulatory Visit: Payer: Self-pay | Admitting: *Deleted

## 2013-06-23 DIAGNOSIS — E039 Hypothyroidism, unspecified: Secondary | ICD-10-CM

## 2013-06-23 DIAGNOSIS — I10 Essential (primary) hypertension: Secondary | ICD-10-CM

## 2013-06-23 MED ORDER — VALACYCLOVIR HCL 500 MG PO TABS: ORAL_TABLET | ORAL | Status: DC

## 2013-06-23 MED ORDER — BENAZEPRIL HCL 20 MG PO TABS: 20.0000 mg | ORAL_TABLET | Freq: Every day | ORAL | Status: DC

## 2013-06-23 MED ORDER — HYDROCHLOROTHIAZIDE 12.5 MG PO TABS: 12.5000 mg | ORAL_TABLET | Freq: Every day | ORAL | Status: DC

## 2013-06-23 MED ORDER — LEVOTHYROXINE SODIUM 25 MCG PO TABS: 25.0000 ug | ORAL_TABLET | Freq: Every day | ORAL | Status: DC

## 2013-06-23 NOTE — Telephone Encounter (Signed)
Exact indication for valacyclovir not obvious from Problem List or Office Note.  Will give one month with no refills so PCP has an opportunity to discuss indication before providing further refills.

## 2013-06-25 ENCOUNTER — Encounter: Payer: Self-pay | Admitting: Physical Medicine & Rehabilitation

## 2013-06-25 ENCOUNTER — Encounter
Payer: PRIVATE HEALTH INSURANCE | Attending: Physical Medicine and Rehabilitation | Admitting: Physical Medicine & Rehabilitation

## 2013-06-25 VITALS — BP 133/78 | HR 104 | Resp 14 | Ht 63.0 in | Wt 149.0 lb

## 2013-06-25 DIAGNOSIS — M67919 Unspecified disorder of synovium and tendon, unspecified shoulder: Secondary | ICD-10-CM

## 2013-06-25 DIAGNOSIS — M961 Postlaminectomy syndrome, not elsewhere classified: Secondary | ICD-10-CM

## 2013-06-25 DIAGNOSIS — F329 Major depressive disorder, single episode, unspecified: Secondary | ICD-10-CM

## 2013-06-25 DIAGNOSIS — G2581 Restless legs syndrome: Secondary | ICD-10-CM

## 2013-06-25 DIAGNOSIS — M719 Bursopathy, unspecified: Secondary | ICD-10-CM

## 2013-06-25 DIAGNOSIS — M75101 Unspecified rotator cuff tear or rupture of right shoulder, not specified as traumatic: Secondary | ICD-10-CM

## 2013-06-25 DIAGNOSIS — F17200 Nicotine dependence, unspecified, uncomplicated: Secondary | ICD-10-CM

## 2013-06-25 DIAGNOSIS — F3289 Other specified depressive episodes: Secondary | ICD-10-CM

## 2013-06-25 DIAGNOSIS — M2170 Unequal limb length (acquired), unspecified site: Secondary | ICD-10-CM

## 2013-06-25 DIAGNOSIS — F32A Depression, unspecified: Secondary | ICD-10-CM

## 2013-06-25 MED ORDER — HYDROCODONE-ACETAMINOPHEN 5-325 MG PO TABS: 1.0000 | ORAL_TABLET | Freq: Four times a day (QID) | ORAL | Status: DC | PRN

## 2013-06-25 MED ORDER — VARENICLINE TARTRATE 1 MG PO TABS: 1.0000 mg | ORAL_TABLET | Freq: Two times a day (BID) | ORAL | Status: DC

## 2013-06-25 MED ORDER — FENTANYL 12 MCG/HR TD PT72: 1.0000 | MEDICATED_PATCH | TRANSDERMAL | Status: DC

## 2013-06-25 MED ORDER — FENTANYL 50 MCG/HR TD PT72: 1.0000 | MEDICATED_PATCH | TRANSDERMAL | Status: DC

## 2013-06-25 NOTE — Patient Instructions (Signed)
CALL ME WITH ANY PROBLEMS OR QUESTIONS (#[phone]).  HAVE A GOOD DAY  

## 2013-06-25 NOTE — Progress Notes (Signed)
Subjective:    Patient ID: Jill Phillips, female    DOB: February 01, 1956, 57 y.o.   MRN: [id number]  HPI  Pain Inventory Average Pain 5 Pain Right Now 5 My pain is aching  In the last 24 hours, has pain interfered with the following? General activity 9 Relation with others 9 Enjoyment of life 9 What TIME of day is your pain at its worst? day and night Sleep (in general) Poor  Pain is worse with: walking and standing Pain improves with: medication Relief from Meds: 9  Mobility walk without assistance how many minutes can you walk? 15 ability to climb steps?  yes do you drive?  no transfers alone  Function disabled: date disabled . Do you have any goals in this area?  no  Neuro/Psych spasms  Prior Studies Any changes since last visit?  no  Physicians involved in your care Any changes since last visit?  no   Family History  Problem Relation Age of Onset  . Kidney disease Mother   . Heart disease Father   . Anuerysm Brother 29    brain  . Heart disease Brother   . Heart disease Sister 53    s/p CABG  . Hypertension Sister    History   Social History  . Marital Status: Divorced    Spouse Name: n/a    Number of Children: 2  . Years of Education: 12+   Occupational History  . disability     back surgeries   Social History Main Topics  . Smoking status: Current Every Day Smoker -- 1.50 packs/day for 45 years    Types: Cigarettes  . Smokeless tobacco: Never Used  . Alcohol Use: No  . Drug Use: No  . Sexual Activity: Not Currently    Partners: Male   Other Topics Concern  . None   Social History Narrative   Lives alone.  One daughter is local, but is getting ready to move to [place], where her children live with their father.  The other daughter lives near [place], [place].   Past Surgical History  Procedure Laterality Date  . Appendectomy    . Abdominal hysterectomy    . Tubal ligation    . Spine surgery      thoracic x 1,  lumbar x 15  . Right  hip replacement    . Knee surgeries r knee      arthroscopy- right  . Hammer toe surgery    . Sleep apnea surgery    . Cholecystectomy    . Back surgery    . Joint replacement    . Total knee arthroplasty  05/29/2012    Procedure: TOTAL KNEE ARTHROPLASTY;  Surgeon: Mcarthur Rossetti, MD;  Location: [facility];  Service: Orthopedics;  Laterality: Right;  Right Total Knee Arthroplasty   Past Medical History  Diagnosis Date  . Postlaminectomy syndrome, thoracic region   . Osteoarthrosis, unspecified whether generalized or localized, lower leg   . Dysthymic disorder   . Calcifying tendinitis of shoulder   . Pain in joint, upper arm   . Chronic pain syndrome   . Lumbago   . Primary localized osteoarthrosis, lower leg   . Hypertension   . Hyperlipidemia   . GERD (gastroesophageal reflux disease)   . Thyroid disease   . Restless leg syndrome   . PONV (postoperative nausea and vomiting)   . Heart murmur   . Sleep apnea     s/p surgery- last sleep study 2011-  doesnt use oxygen or machine at night as instructed  . History of blood transfusion 1980  . Hypothyroidism   . Active smoker    BP 133/78  Pulse 104  Resp 14  Ht 5\' 3"  (1.6 m)  Wt 149 lb (67.586 kg)  BMI 26.4 kg/m2  SpO2 96%     Review of Systems  Respiratory: Positive for apnea.   Cardiovascular: Positive for leg swelling.  Musculoskeletal: Positive for back pain.  Neurological:       Spasms  All other systems reviewed and are negative.       Objective:   Physical Exam  Constitutional: She is oriented to person, place, and time. She appears well-developed and well-nourished.  HENT:  Head: Normocephalic.  Eyes: EOM are normal. Pupils are equal, round, and reactive to light.  Neck: Normal range of motion.  Cardiovascular: Normal rate.  Pulmonary/Chest: Effort normal.  Abdominal: Soft.  Musculoskeletal:  Right knee with minimal tenderness. Post-op scars noted. Knee rom 120+ with flexion. Weight bearing  better with minimal antalgia seen.  Right shoulder with only mild tenderness with ROM. No shoulder instability noted. Had full AROM.  Generalized tenderness of c-spine with some limitation in ROM present Neurological: She is alert and oriented to person, place, and time.  Skin: Skin is warm.  Psychiatric: She has a normal mood and affect. Her behavior is normal. Judgment and thought content normal.    Assessment & Plan:   ASSESSMENT:  1. Chronic lumbar spine pain/post-lami syndrome  2. Osteoarthritis of the knees bilaterally, right greater than left.  3. Rotator cuff syndrome/subacromial bursitis.  4. Restless legs syndrome.  5. O2 dependent sleep apnea.  6. Tobacco abuse.  7. Right leg length discrepancy.     PLAN:  1. Reviewed with the patient the importance of adjusting and using her cpap. I reviewed the findings of her study. I would like to try to decrease her narcotic burden to help with the central aspect of the apnea. Will decrease her fentanyl to 55mcg every 72 hours. Continue with hydrocodone for breakthrough pain.  2. Continue with 4mg  klonopin at night. Consider mysoline trial or requip retrial but we need to be observant to her sleep apnea. i discussed the fact that improving her sleep will help her on multiple levels including her pain. 3. DC'ed trileptal due to #1 and lack of overall efficacy. 4. For smoking cessation we will begin a trial of chantix. Discussed the fact that she needs to STOP smoking while on this. It's not something where she can continue smoking along the way   4. Follow up with me in 4 weeks. 30 minutes of face to face patient care time were spent during this visit. All questions were encouraged and answered.

## 2013-06-28 ENCOUNTER — Ambulatory Visit (INDEPENDENT_AMBULATORY_CARE_PROVIDER_SITE_OTHER): Payer: Medicare Other

## 2013-06-28 DIAGNOSIS — G4737 Central sleep apnea in conditions classified elsewhere: Secondary | ICD-10-CM

## 2013-06-28 DIAGNOSIS — G2581 Restless legs syndrome: Secondary | ICD-10-CM

## 2013-06-28 DIAGNOSIS — G4736 Sleep related hypoventilation in conditions classified elsewhere: Secondary | ICD-10-CM

## 2013-06-28 DIAGNOSIS — G4733 Obstructive sleep apnea (adult) (pediatric): Secondary | ICD-10-CM

## 2013-06-28 DIAGNOSIS — F17200 Nicotine dependence, unspecified, uncomplicated: Secondary | ICD-10-CM

## 2013-07-01 ENCOUNTER — Other Ambulatory Visit: Payer: Self-pay | Admitting: Physical Medicine and Rehabilitation

## 2013-07-01 ENCOUNTER — Telehealth: Payer: Self-pay | Admitting: *Deleted

## 2013-07-01 ENCOUNTER — Other Ambulatory Visit: Payer: Self-pay | Admitting: Physical Medicine & Rehabilitation

## 2013-07-01 DIAGNOSIS — M2170 Unequal limb length (acquired), unspecified site: Secondary | ICD-10-CM

## 2013-07-01 DIAGNOSIS — M75101 Unspecified rotator cuff tear or rupture of right shoulder, not specified as traumatic: Secondary | ICD-10-CM

## 2013-07-01 DIAGNOSIS — F329 Major depressive disorder, single episode, unspecified: Secondary | ICD-10-CM

## 2013-07-01 DIAGNOSIS — G2581 Restless legs syndrome: Secondary | ICD-10-CM

## 2013-07-01 DIAGNOSIS — F32A Depression, unspecified: Secondary | ICD-10-CM

## 2013-07-01 DIAGNOSIS — M961 Postlaminectomy syndrome, not elsewhere classified: Secondary | ICD-10-CM

## 2013-07-01 NOTE — Telephone Encounter (Signed)
Calling about her restless leg.  CPap or BiPap not going to help her sleep any better and not do her any good because her legs hurt so bad..  Dr Naaman Plummer lowered her fentanyl patches to 62 mcg and it is not working.  She was going to ask when they were 75 mcg if she could change them q 2 days because they only last 2-2.5 days at best. 62 is "not doing nothing". Something has got to be done about her restless legs because she is not able to sleep. Please advise

## 2013-07-02 ENCOUNTER — Other Ambulatory Visit: Payer: Self-pay | Admitting: *Deleted

## 2013-07-02 MED ORDER — CARISOPRODOL 350 MG PO TABS: ORAL_TABLET | ORAL | Status: DC

## 2013-07-04 IMAGING — US US EXTREM LOW VENOUS*R*
1 series · 14 of 24 positions shown · non-contrast
Comparison: None

CLINICAL DATA: Pain and tightness rule, recent knee surgery

RIGHT LOWER EXTREMITY VENOUS DOPPLER ULTRASOUND
TECHNIQUE: Gray-scale sonography with compression, as well as color
and duplex ultrasound, were performed to evaluate the deep venous
system from the level of the common femoral vein through the
popliteal and proximal calf veins.

[Series 1: us extrem low venous*right* · 14 of 32 slices shown]
[im 1/32]
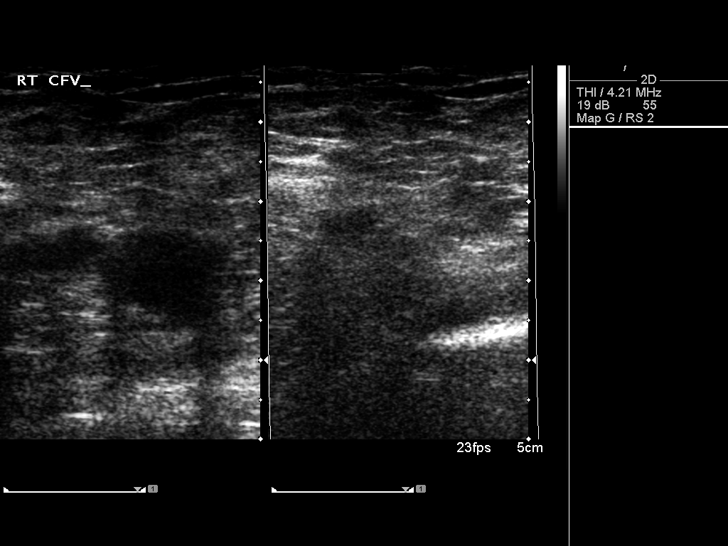
[im 3/32]
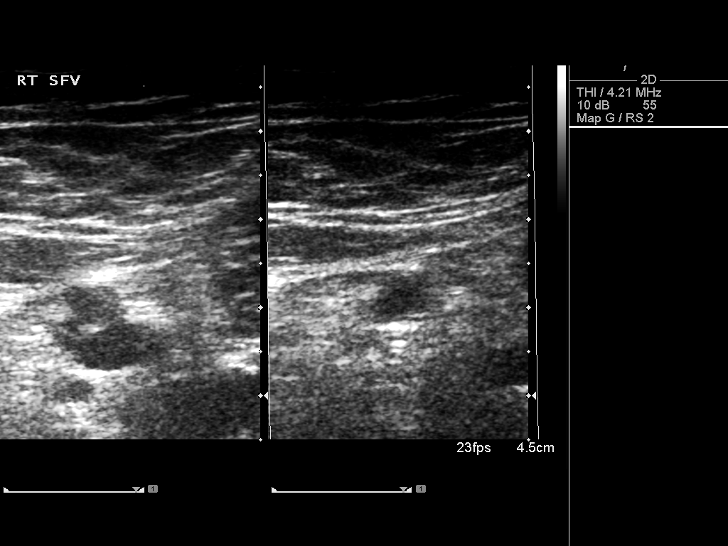
[im 6/32]
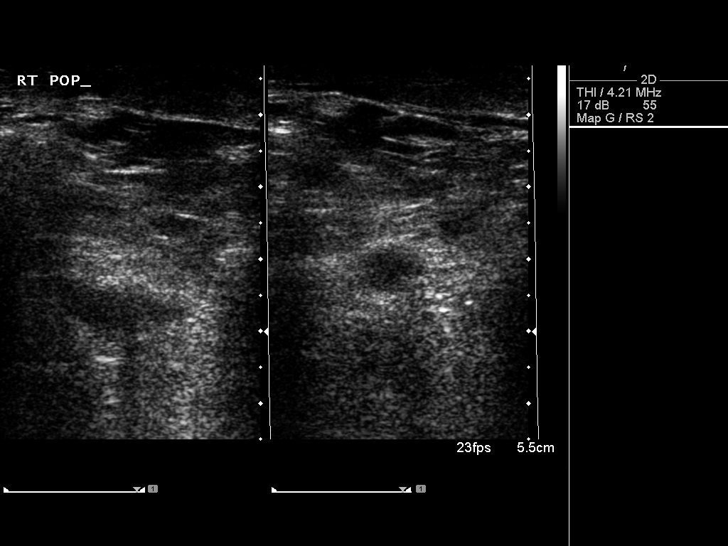
[im 9/32]
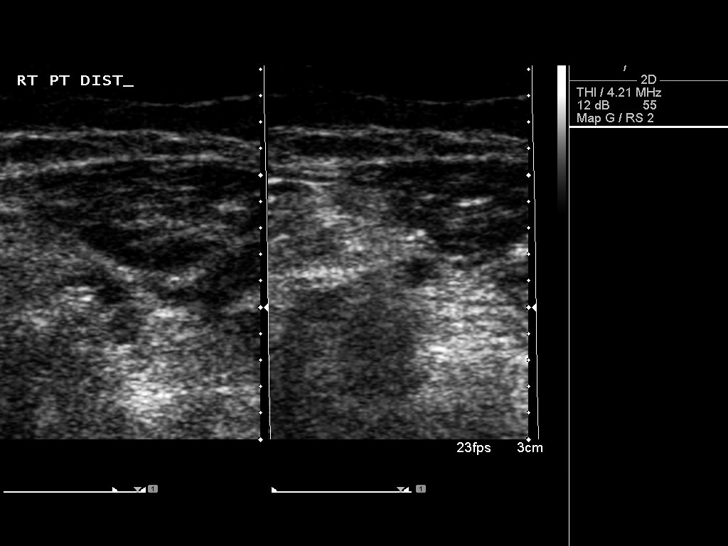
[im 10/32]
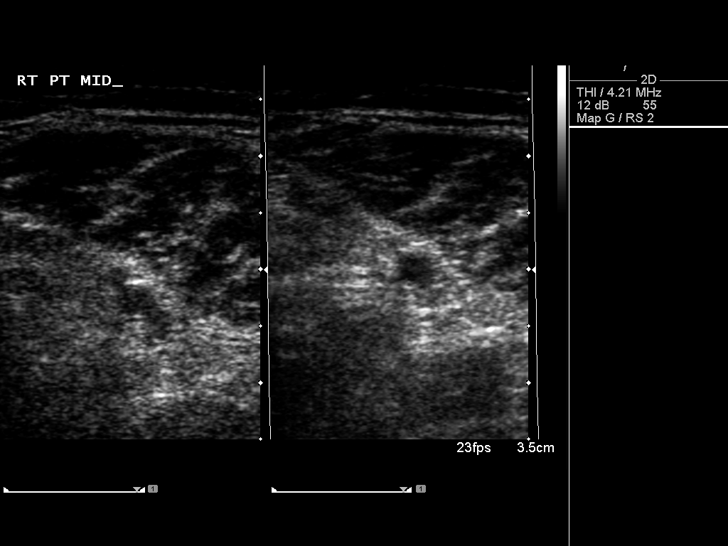
[im 13/32]
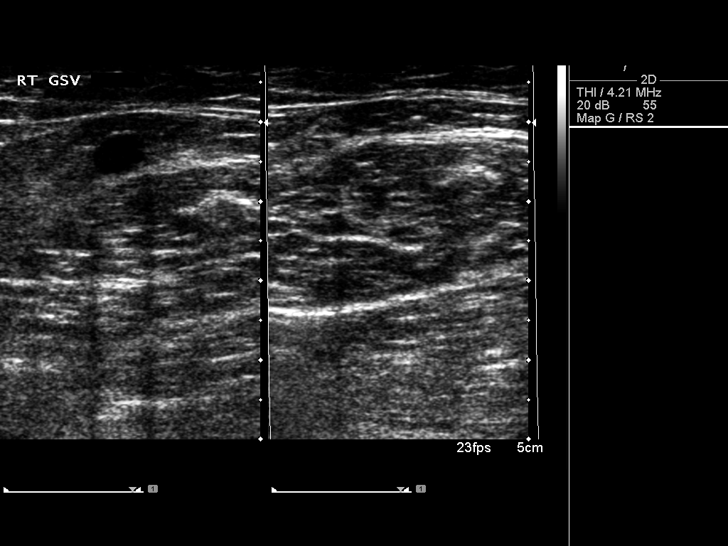
[im 15/32]
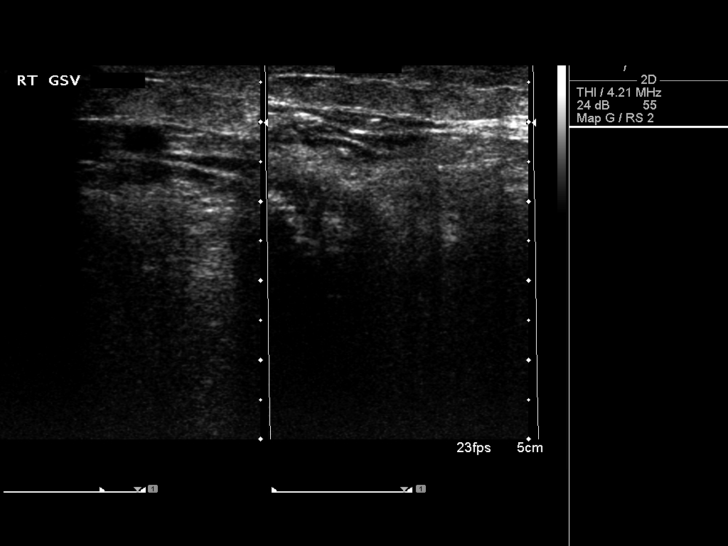
[im 17/32]
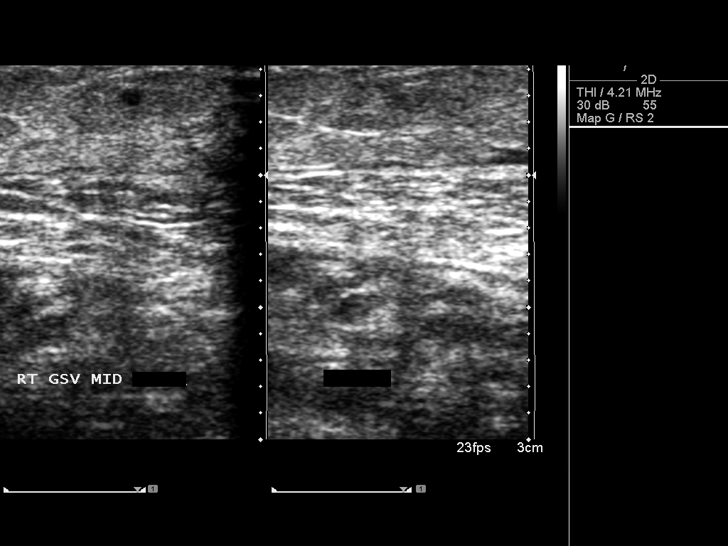
[im 19/32]
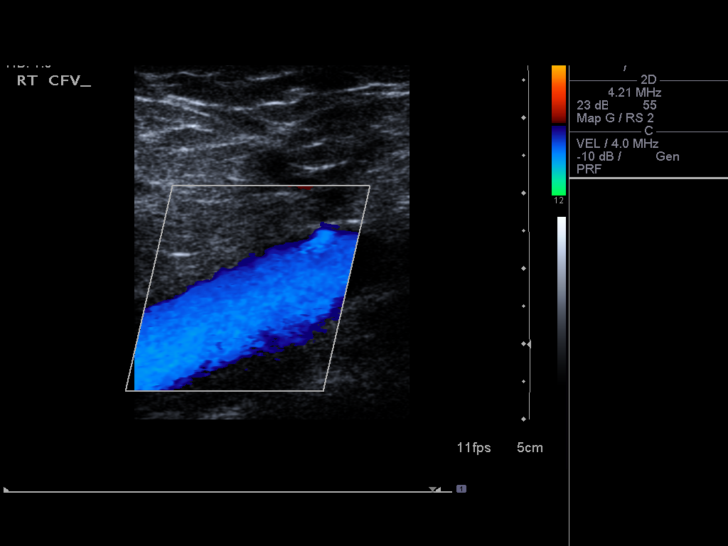
[im 22/32]
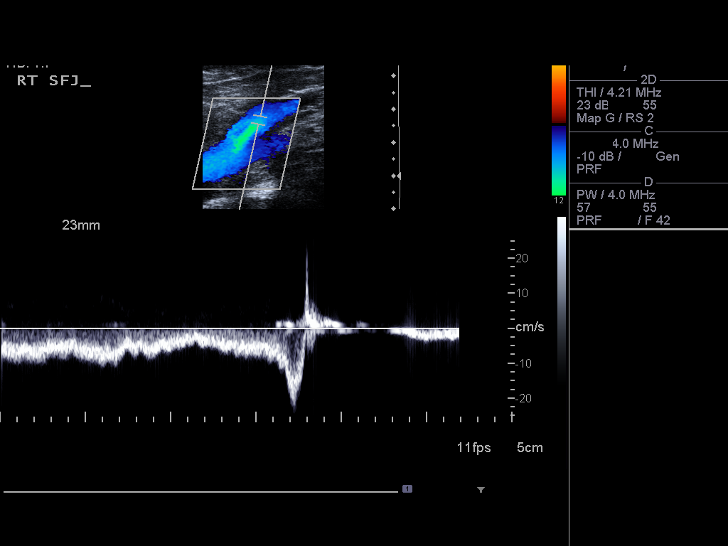
[im 25/32]
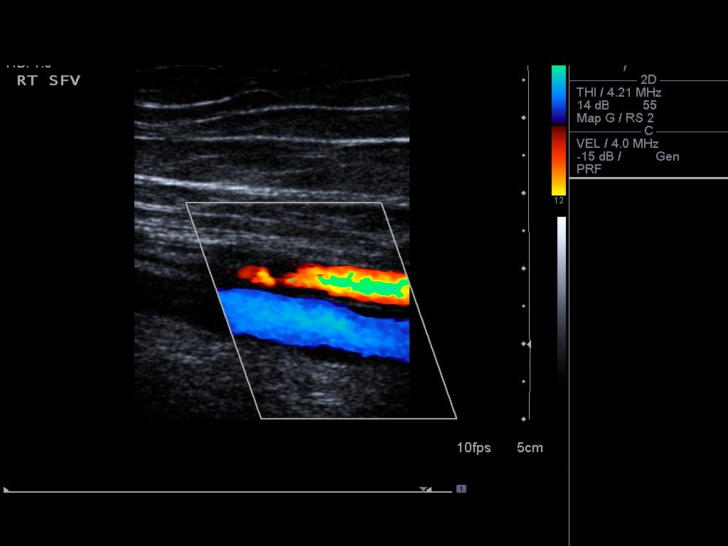
[im 26/32]
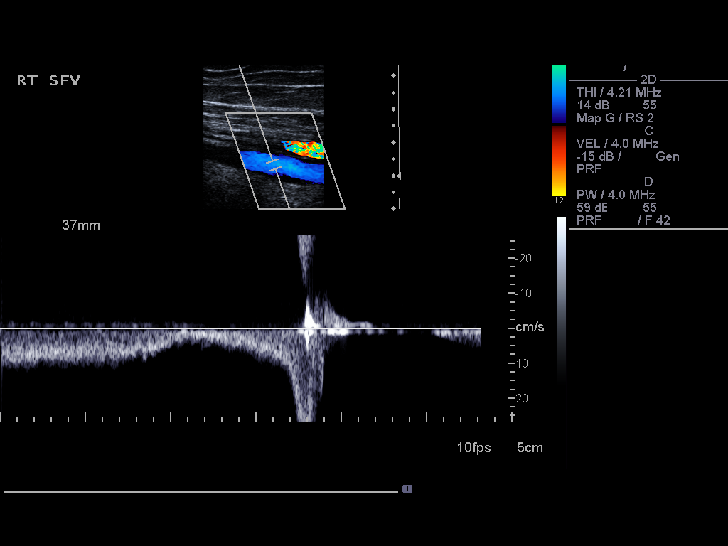
[im 29/32]
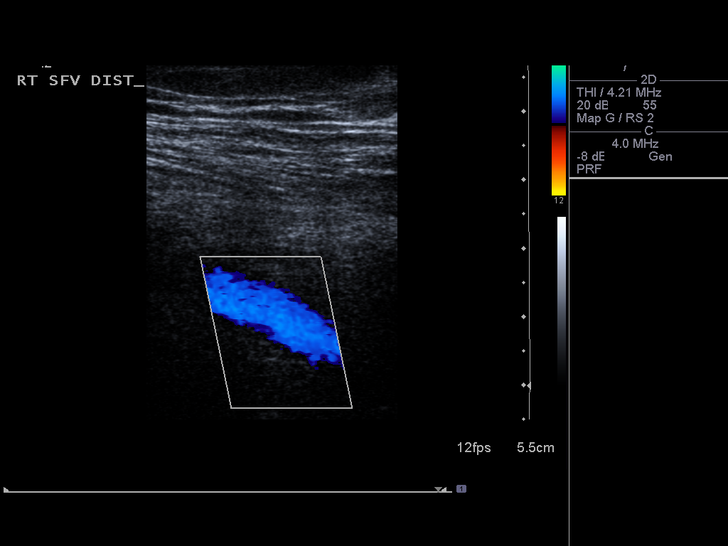
[im 32/32]
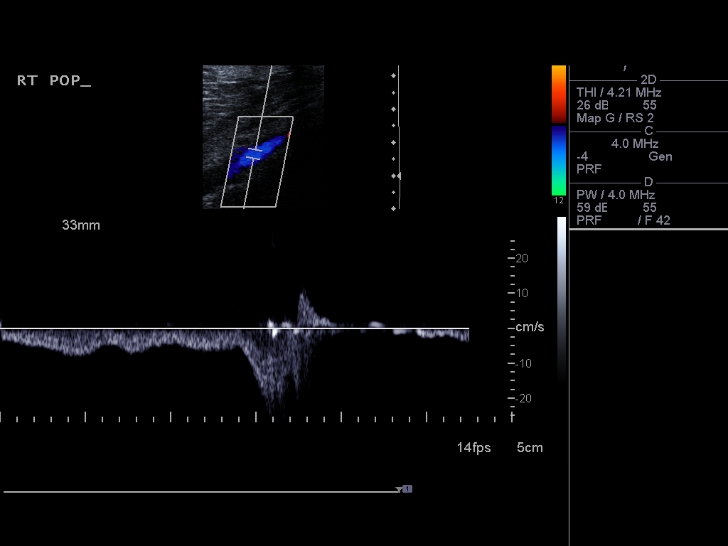

[14 of 24 positions shown; findings below may reference images not displayed]

FINDINGS: Normal compressibility of  the common femoral,
superficial femoral, and popliteal veins, as well as the proximal
calf veins.  No filling defects to suggest DVT on grayscale or
color Doppler imaging.  Doppler waveforms show normal direction of
venous flow, normal respiratory phasicity and response to
augmentation. The greater saphenous vein is compressible in its
visualized segments.
IMPRESSION: No evidence of  lower extremity deep vein thrombosis.

## 2013-07-05 MED ORDER — FENTANYL 50 MCG/HR TD PT72: 1.0000 | MEDICATED_PATCH | TRANSDERMAL | Status: DC

## 2013-07-05 MED ORDER — FENTANYL 12 MCG/HR TD PT72: 1.0000 | MEDICATED_PATCH | TRANSDERMAL | Status: DC

## 2013-07-05 NOTE — Telephone Encounter (Signed)
Left voicemail to call us about her rx's.

## 2013-07-05 NOTE — Telephone Encounter (Signed)
Patient called regarding her patches.  She says the pharmacy only gave her 1 box of 43mcg and 1 box of 96mcg.  She wanted to understand why the script was written for 1 box each.  Please adivse.

## 2013-07-05 NOTE — Telephone Encounter (Signed)
i will re-write the rx for 10 patches. The current rx DOES only have #5

## 2013-07-06 NOTE — Telephone Encounter (Signed)
Patient returned called

## 2013-07-07 NOTE — Telephone Encounter (Signed)
Patient informed her fentanyl patch prescriptions are ready for pick up.  Offered her appointments for her problem with her restless legs, also advised her to contact pcp.   She will follow up with her neurologist.

## 2013-07-08 ENCOUNTER — Telehealth: Payer: Self-pay | Admitting: Neurology

## 2013-07-08 NOTE — Telephone Encounter (Signed)
Pt called requesting the results from her titration study and also to voice concern over the severity of her rls.  She feels that she experiences such intense pain due to the restless legs syndrome that it will ultimately affect her ability to use the machine effectively.  She is usually up several times each night to try and ease the pain in her legs.  I asked the patient did she discuss the severity of her rls with you during previous visits and she wasn't sure that she went through much detail regarding it.  Please advise, thanks!

## 2013-07-08 NOTE — Sleep Study (Signed)
Please advise patient that I will read her sleep study tomorrow. Most likely she will do quite well regarding the restless legs and the pain when on treatment for sleep apnea because a lot of patients to report that the restless leg symptoms improve when they are treated for sleep apnea with a machine.

## 2013-07-09 ENCOUNTER — Telehealth: Payer: Self-pay | Admitting: Neurology

## 2013-07-09 DIAGNOSIS — G4737 Central sleep apnea in conditions classified elsewhere: Secondary | ICD-10-CM

## 2013-07-09 DIAGNOSIS — G4733 Obstructive sleep apnea (adult) (pediatric): Secondary | ICD-10-CM

## 2013-07-09 DIAGNOSIS — G4736 Sleep related hypoventilation in conditions classified elsewhere: Secondary | ICD-10-CM

## 2013-07-09 NOTE — Telephone Encounter (Signed)
Left message for patient regarding sleep study results, asked patient to call me back to discuss results and have questions answered.  Explained that a copy of the sleep study was sent to referring physician and copy of study is coming to her in the mail.  A letter will be mailed with this copy of study.   PATIENT CALLED BACK AND THESE NOTES ARE BELOW:  Will look for O2 therapy provider and send orders there if I can find who it is, otherwise I will have to wait to talk with the patient. (Looked and can't determine who the DME is for this patient).  O2 therapy provider is: [facility], will send orders for CPAP therapy to them.  RLS: Fentanyl seems to help and if patch is new, then RLS is under better control.  Can't breathe well out of left side of nose, but can use breathe right strips with significant results in her breathing.  We discussed that she should continue to use them with CPAP therapy because she has a deviated nasal septum.  Pt wants to quit smoking, but needs to wait until next month to do it?  Looking for help for smoking cessation and nicotine patches from insurance company.

## 2013-07-09 NOTE — Telephone Encounter (Signed)
Please call and inform patient that I have entered an order for treatment with PAP. We will arrange for a machine through a DME company and I will see the patient back in follow-up in about 6 weeks. I will be looking out for compliance data downloaded from the machine at the time of the followup appointment and discuss how it is going with PAP treatment at the time of the visit. Please also make sure, the patient has a follow-up appointment with me in 6 weeks from the setup date, thanks. We are going to continue with oxygen with CPAP. Also, the study did not show optimal results with any treatment modality tried, but we should get her started with CPAP and follow up from there.   Star Age, MD, PhD [facility])

## 2013-07-13 ENCOUNTER — Encounter: Payer: Self-pay | Admitting: *Deleted

## 2013-07-14 ENCOUNTER — Encounter: Payer: Self-pay | Admitting: Internal Medicine

## 2013-07-14 ENCOUNTER — Ambulatory Visit (INDEPENDENT_AMBULATORY_CARE_PROVIDER_SITE_OTHER): Payer: PRIVATE HEALTH INSURANCE | Admitting: Internal Medicine

## 2013-07-14 VITALS — BP 140/85 | HR 94 | Temp 98.4°F | Ht 64.25 in | Wt 150.9 lb

## 2013-07-14 DIAGNOSIS — G2581 Restless legs syndrome: Secondary | ICD-10-CM

## 2013-07-14 DIAGNOSIS — Z23 Encounter for immunization: Secondary | ICD-10-CM

## 2013-07-14 DIAGNOSIS — B009 Herpesviral infection, unspecified: Secondary | ICD-10-CM

## 2013-07-14 MED ORDER — VALACYCLOVIR HCL 500 MG PO TABS: ORAL_TABLET | ORAL | Status: DC

## 2013-07-14 NOTE — Assessment & Plan Note (Signed)
Patient states her first episode was several years ago when she's had one recurrence since then. No recurrences on Valtrex 500 mg daily so we'll continue.

## 2013-07-14 NOTE — Assessment & Plan Note (Signed)
She states that Klonopin and fentanyl help with this pain. She states that fentanyl patches affecting her sleep apnea and her PMR doctors trying to titrate down her medication. Advised her to go see orthopedic Dr. for evaluation of spinal cause of her pain. She states she has had 16 back surgeries in the past.

## 2013-07-14 NOTE — Patient Instructions (Addendum)
We would like you to go back to your orthopedic doctor so he can look at whether your spine might be causing some of the problems with your legs.   We have given you a tetanus shot today.  Please come back and see your regular doctor in about 3-6 months or sooner if you are having problems. Our number is [phone].   Restless Legs Syndrome Restless legs syndrome is a movement disorder. It may also be called a sensori-motor disorder.  CAUSES  No one knows what specifically causes restless legs syndrome, but it tends to run in families. It is also more common in people with low iron, in pregnancy, in people who need dialysis, and those with nerve damage (neuropathy).Some medications may make restless legs syndrome worse.Those medications include drugs to treat high blood pressure, some heart conditions, nausea, colds, allergies, and depression. SYMPTOMS Symptoms include uncomfortable sensations in the legs. These leg sensations are worse during periods of inactivity or rest. They are also worse while sitting or lying down. Individuals that have the disorder describe sensations in the legs that feel like:  Pulling.  Drawing.  Crawling.  Worming.  Boring.  Tingling.  Pins and needles.  Prickling.  Pain. The sensations are usually accompanied by an overwhelming urge to move the legs. Sudden muscle jerks may also occur. Movement provides temporary relief from the discomfort. In rare cases, the arms may also be affected. Symptoms may interfere with going to sleep (sleep onset insomnia). Restless legs syndrome may also be related to periodic limb movement disorder (PLMD). PLMD is another more common motor disorder. It also causes interrupted sleep. The symptoms from PLMD usually occur most often when you are awake. TREATMENT  Treatment for restless legs syndrome is symptomatic. This means that the symptoms are treated.   Massage and cold compresses may provide temporary  relief.  Walk, stretch, or take a cold or hot bath.  Get regular exercise and a good night's sleep.  Avoid caffeine, alcohol, nicotine, and medications that can make it worse.  Do activities that provide mental stimulation like discussions, needlework, and video games. These may be helpful if you are not able to walk or stretch. Some medications are effective in relieving the symptoms. However, many of these medications have side effects. Ask your caregiver about medications that may help your symptoms. Correcting iron deficiency may improve symptoms for some patients. Document Released: 08/09/2002 Document Revised: 11/11/2011 Document Reviewed: 11/15/2010 [facility] Patient Information 2014 [place].

## 2013-07-14 NOTE — Progress Notes (Signed)
Subjective:     Patient ID: Jill Phillips, female   DOB: 12-Sep-1955, 57 y.o.   MRN: [id number]  HPI The patient is a 57 year old female who comes in today for clarification of a medical regimen. Her PCP has asked that I clarify the use of Valtrex. She is on multiple different medications from the pain center including clonazepam soma fentanyl, Vicodin, Lunesta. She has past medical history of hypertension, hyperlipidemia, hypothyroidism, pain, restless leg syndrome. She was recently diagnosed with obstructive sleep apnea and was prescribed CPAP from the pain specialist. 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 She states that the outbreak was very painful and inside and outside. She would like to discuss her restless legs. She states that they hurt 24/7. She states that sometimes she wakes up frequently with pain however the pain doesn't really change in consistency or quality throughout the day. She states that usually her fentanyl patch helps although sometimes it wears off in about 2 and half days instead of 3. Occasionally she still has pain even with new fentanyl patch. She states this has been going on most of her life. She states her mother has restless leg although her mother does not have pain with her restless leg. She has had 16 back operations throughout her lifetime for various slipped disc/nerve impingement/fusion. She has not seen her orthopedic doctor in some time however she goes to [facility]. She's not having any complaints at today's visit than chronic pain. No chest pain, shortness of breath, nausea, vomiting, diarrhea. No loss of bowel or bladder. No weight loss.  Review of Systems  Constitutional: Negative for fever, chills, diaphoresis, activity change, appetite change, fatigue and unexpected weight change.  HENT: Negative.   Eyes: Negative.    Respiratory: Negative for cough, chest tightness, shortness of breath and wheezing.   Cardiovascular: Negative for chest pain, palpitations and leg swelling.  Gastrointestinal: Negative for nausea, vomiting, abdominal pain and diarrhea.  Musculoskeletal: Positive for arthralgias and myalgias.  Neurological: Negative for dizziness, tremors, seizures, syncope, facial asymmetry, speech difficulty, weakness, light-headedness, numbness and headaches.       Objective:   Physical Exam  Constitutional: She is oriented to person, place, and time. She appears well-developed and well-nourished. No distress.  HENT:  Head: Normocephalic and atraumatic.  Eyes: EOM are normal. Pupils are equal, round, and reactive to light.  Neck: Normal range of motion. Neck supple.  Cardiovascular: Normal rate and regular rhythm.   No murmur heard. Pulmonary/Chest: Effort normal and breath sounds normal. No respiratory distress. She has no wheezes. She has no rales.  Abdominal: Soft. Bowel sounds are normal. She exhibits no distension. There is no tenderness. There is no rebound.  Musculoskeletal: Normal range of motion. She exhibits tenderness. She exhibits no edema.  Tenderness in the lumbar region and also tenderness in her legs bilaterally and diminished ROM in the right knee s/p total knee replacement.   Neurological: She is alert and oriented to person, place, and time. No cranial nerve deficit.  Skin: Skin is warm and dry.       Assessment/Plan:   1. Clarification of Valtrex-the patient states that she does take it for suppression of herpes simplex. Did clarify with her how many flares she has and her last flare. Will prescribe Valtrex 500 mg daily with 3 refills.  2. Restless legs-patient was advised to call  her orthopedic surgeon to go back and clarify her status of her back. Concern for bone spurs and nerve impingement causing constant pain. Unclear if steroid injection to the back may be helpful however  would be a reasonable option. With her pain all of her she has never had skin rash, fevers, chills, weight loss, photosensitivity. Does not sound to be rheumatologic in nature.  3. Disposition - patient will return in 3-6 months with PCP. Prescribed Valtrex 500 mg daily with 3 refills. Did not prescribe anything for her restless legs as per PMR note they do not wish to add additional agent at this time for pain control. We will leave pain management to them. Have advised her to see orthopedic surgeon to see if there is a modifiable cause of her leg pain. Advised that she quit smoking, start using CPAP. Gave Tdap at today's visit.

## 2013-07-16 NOTE — Progress Notes (Signed)
Case discussed with Dr. Kollar soon after the resident saw the patient.  We reviewed the resident's history and exam and pertinent patient test results.  I agree with the assessment, diagnosis, and plan of care documented in the resident's note. 

## 2013-07-19 ENCOUNTER — Other Ambulatory Visit: Payer: Self-pay

## 2013-07-19 DIAGNOSIS — G2581 Restless legs syndrome: Secondary | ICD-10-CM

## 2013-07-19 MED ORDER — CLONAZEPAM 2 MG PO TABS: 4.0000 mg | ORAL_TABLET | Freq: Every day | ORAL | Status: DC

## 2013-07-23 ENCOUNTER — Encounter: Payer: Self-pay | Admitting: Physical Medicine and Rehabilitation

## 2013-07-23 ENCOUNTER — Encounter
Payer: PRIVATE HEALTH INSURANCE | Attending: Physical Medicine and Rehabilitation | Admitting: Physical Medicine and Rehabilitation

## 2013-07-23 VITALS — BP 129/76 | HR 103 | Resp 14 | Ht 64.0 in | Wt 151.2 lb

## 2013-07-23 DIAGNOSIS — F329 Major depressive disorder, single episode, unspecified: Secondary | ICD-10-CM

## 2013-07-23 DIAGNOSIS — F32A Depression, unspecified: Secondary | ICD-10-CM

## 2013-07-23 DIAGNOSIS — Z96659 Presence of unspecified artificial knee joint: Secondary | ICD-10-CM | POA: Insufficient documentation

## 2013-07-23 DIAGNOSIS — M961 Postlaminectomy syndrome, not elsewhere classified: Secondary | ICD-10-CM

## 2013-07-23 DIAGNOSIS — M2170 Unequal limb length (acquired), unspecified site: Secondary | ICD-10-CM

## 2013-07-23 DIAGNOSIS — F17200 Nicotine dependence, unspecified, uncomplicated: Secondary | ICD-10-CM | POA: Insufficient documentation

## 2013-07-23 DIAGNOSIS — M25559 Pain in unspecified hip: Secondary | ICD-10-CM | POA: Insufficient documentation

## 2013-07-23 DIAGNOSIS — M67919 Unspecified disorder of synovium and tendon, unspecified shoulder: Secondary | ICD-10-CM

## 2013-07-23 DIAGNOSIS — Z79899 Other long term (current) drug therapy: Secondary | ICD-10-CM | POA: Insufficient documentation

## 2013-07-23 DIAGNOSIS — M1710 Unilateral primary osteoarthritis, unspecified knee: Secondary | ICD-10-CM | POA: Insufficient documentation

## 2013-07-23 DIAGNOSIS — M545 Low back pain, unspecified: Secondary | ICD-10-CM | POA: Insufficient documentation

## 2013-07-23 DIAGNOSIS — G4730 Sleep apnea, unspecified: Secondary | ICD-10-CM | POA: Insufficient documentation

## 2013-07-23 DIAGNOSIS — G2581 Restless legs syndrome: Secondary | ICD-10-CM

## 2013-07-23 DIAGNOSIS — F3289 Other specified depressive episodes: Secondary | ICD-10-CM

## 2013-07-23 DIAGNOSIS — M75101 Unspecified rotator cuff tear or rupture of right shoulder, not specified as traumatic: Secondary | ICD-10-CM

## 2013-07-23 MED ORDER — HYDROCODONE-ACETAMINOPHEN 5-325 MG PO TABS: 1.0000 | ORAL_TABLET | Freq: Four times a day (QID) | ORAL | Status: DC | PRN

## 2013-07-23 MED ORDER — FENTANYL 50 MCG/HR TD PT72: 50.0000 ug | MEDICATED_PATCH | TRANSDERMAL | Status: DC

## 2013-07-23 MED ORDER — FENTANYL 12 MCG/HR TD PT72: 12.5000 ug | MEDICATED_PATCH | TRANSDERMAL | Status: DC

## 2013-07-23 NOTE — Progress Notes (Signed)
Subjective:    Patient ID: Jill Phillips, female    DOB: [birth year], 57 y.o.   MRN: [id number]  HPI The patient complains about chronic LBP , and restless leg syndrome bilateral .  The LBP has been stable.Patient reports that her RLS has improved tremendously with using her new C-PAP with O2, and taking the magnesium.   Patient is managing with her medication, some exercises and resting most of the time.  Hx. Of multiple back surgeries, with PSF   Pain Inventory Average Pain 4 Pain Right Now 4 My pain is spasms  In the last 24 hours, has pain interfered with the following? General activity 1 Relation with others 1 Enjoyment of life 1 What TIME of day is your pain at its worst? daytime Sleep (in general) Fair  Pain is worse with: walking and standing Pain improves with: rest and medication Relief from Meds: 8  Mobility how many minutes can you walk? 15 ability to climb steps?  yes  Function disabled: date disabled .  Neuro/Psych spasms  Prior Studies Any changes since last visit?  yes had sleep study and has cpap, it is helping!  Physicians involved in your care Any changes since last visit?  no   Family History  Problem Relation Age of Onset  . Kidney disease Mother   . Heart disease Father   . Anuerysm Brother 29    brain  . Heart disease Brother   . Heart disease Sister 54    s/p CABG  . Hypertension Sister    History   Social History  . Marital Status: Divorced    Spouse Name: n/a    Number of Children: 2  . Years of Education: 12+   Occupational History  . disability     back surgeries   Social History Main Topics  . Smoking status: Current Every Day Smoker -- 1.50 packs/day for 45 years    Types: Cigarettes  . Smokeless tobacco: Never Used  . Alcohol Use: No  . Drug Use: No  . Sexual Activity: Not Currently    Partners: Male   Other Topics Concern  . None   Social History Narrative   Lives alone.  One daughter is local, but is  getting ready to move to [place], where her children live with their father.  The other daughter lives near [place], [place].   Past Surgical History  Procedure Laterality Date  . Appendectomy    . Abdominal hysterectomy    . Tubal ligation    . Spine surgery      thoracic x 1,  lumbar x 15  . Right hip replacement    . Knee surgeries r knee      arthroscopy- right  . Hammer toe surgery    . Sleep apnea surgery    . Cholecystectomy    . Back surgery    . Joint replacement    . Total knee arthroplasty  05/29/2012    Procedure: TOTAL KNEE ARTHROPLASTY;  Surgeon: Mcarthur Rossetti, MD;  Location: [facility];  Service: Orthopedics;  Laterality: Right;  Right Total Knee Arthroplasty   Past Medical History  Diagnosis Date  . Postlaminectomy syndrome, thoracic region   . Osteoarthrosis, unspecified whether generalized or localized, lower leg   . Dysthymic disorder   . Calcifying tendinitis of shoulder   . Pain in joint, upper arm   . Chronic pain syndrome   . Lumbago   . Primary localized osteoarthrosis, lower leg   .  Hypertension   . Hyperlipidemia   . GERD (gastroesophageal reflux disease)   . Thyroid disease   . Restless leg syndrome   . PONV (postoperative nausea and vomiting)   . Heart murmur   . Sleep apnea     s/p surgery- last sleep study 2011- doesnt use oxygen or machine at night as instructed  . History of blood transfusion 1980  . Hypothyroidism   . Active smoker    BP 129/76  Pulse 103  Resp 14  Ht 5\' 4"  (1.626 m)  Wt 151 lb 3.2 oz (68.584 kg)  BMI 25.94 kg/m2  SpO2 94%   Review of Systems  Respiratory: Positive for apnea.   All other systems reviewed and are negative.       Objective:   Physical Exam Constitutional: She is oriented to person, place, and time. She appears well-developed and well-nourished.  HENT:  Head: Normocephalic.  Musculoskeletal: She exhibits tenderness.  Neurological: She is alert and oriented to person, place, and time.   Skin: Skin is warm and dry.  Psychiatric: She has a normal mood and affect.  Symmetric normal motor tone is noted throughout. Normal muscle bulk. Muscle testing reveals 5/5 muscle strength of the upper extremity, and 5/5 of the lower extremity. Full range of motion in upper and lower extremities, except decrease ROM in right knee, extension deficit. ROM of spine is restricted. Fine motor movements are normal in both hands.  DTR in the upper and lower extremity are present and symmetric 2+, except left patella reflex 0. No clonus is noted.  Patient arises from chair with mild difficulty. Narrow based gait with normal arm swing bilateral.        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 Ninfa Linden, 05/29/2012, right knee is almost pain free.  2.Post laminectomy syndrome  3.restless leg syndrome  4. Bilateral hip pain  5. Hx of Sleep Apnea, patient is now using a C-PAP, with O2, and now feels much better, and her RLS has improved a lot with the O2 and Magnesium .  Plan :  1. Discussed appropriate sleeping habits.Patient should continue using the C-PAP with O2  2. We will continue with Klonopin for now.  3.Refilled Hydrocodone 5/325 # 45.  Refilled Fentanyl Duragesic at 62 mcg 1 q.72h hours #10 each without a refill.Patient tolerated the decrease of her fentanyl to 7mcg every 72 hours, well, she states that she would like to decrease to 26mcg next month. Continue with hydrocodone for breakthrough pain.    Advised patient to continue with staying as active as possible, also she should look into aquatic exercises . She states that she is planing to join a Y with her cousin Concerning her smoking cessation, she has not started chantix yet, because a friend told her that this medication can make her "crazy", she has the package at home but has not started it. Discussed the possible side effects today, patient will think about  this.   Follow up in 1 month.

## 2013-07-23 NOTE — Patient Instructions (Signed)
Try to look into aquatic exercises in a [facility]

## 2013-08-09 ENCOUNTER — Other Ambulatory Visit: Payer: Self-pay | Admitting: *Deleted

## 2013-08-09 DIAGNOSIS — M2170 Unequal limb length (acquired), unspecified site: Secondary | ICD-10-CM

## 2013-08-09 DIAGNOSIS — F32A Depression, unspecified: Secondary | ICD-10-CM

## 2013-08-09 DIAGNOSIS — F329 Major depressive disorder, single episode, unspecified: Secondary | ICD-10-CM

## 2013-08-09 DIAGNOSIS — M961 Postlaminectomy syndrome, not elsewhere classified: Secondary | ICD-10-CM

## 2013-08-09 DIAGNOSIS — G2581 Restless legs syndrome: Secondary | ICD-10-CM

## 2013-08-09 DIAGNOSIS — M75101 Unspecified rotator cuff tear or rupture of right shoulder, not specified as traumatic: Secondary | ICD-10-CM

## 2013-08-09 MED ORDER — HYDROCODONE-ACETAMINOPHEN 5-325 MG PO TABS: 1.0000 | ORAL_TABLET | Freq: Four times a day (QID) | ORAL | Status: DC | PRN

## 2013-08-09 MED ORDER — FENTANYL 50 MCG/HR TD PT72: 50.0000 ug | MEDICATED_PATCH | TRANSDERMAL | Status: DC

## 2013-08-09 NOTE — Telephone Encounter (Signed)
rx printed early for controlled medication for the visit with RN on 08/16/13 (to be signed by MD)

## 2013-08-16 ENCOUNTER — Other Ambulatory Visit: Payer: Self-pay | Admitting: Family Medicine

## 2013-08-16 ENCOUNTER — Encounter: Payer: PRIVATE HEALTH INSURANCE | Attending: Physical Medicine & Rehabilitation | Admitting: *Deleted

## 2013-08-16 ENCOUNTER — Telehealth: Payer: Self-pay | Admitting: *Deleted

## 2013-08-16 ENCOUNTER — Encounter: Payer: Self-pay | Admitting: *Deleted

## 2013-08-16 VITALS — BP 125/67 | HR 81 | Resp 14 | Ht 64.0 in | Wt 151.0 lb

## 2013-08-16 DIAGNOSIS — M1710 Unilateral primary osteoarthritis, unspecified knee: Secondary | ICD-10-CM | POA: Insufficient documentation

## 2013-08-16 DIAGNOSIS — M2170 Unequal limb length (acquired), unspecified site: Secondary | ICD-10-CM

## 2013-08-16 DIAGNOSIS — M170 Bilateral primary osteoarthritis of knee: Secondary | ICD-10-CM

## 2013-08-16 DIAGNOSIS — Z5181 Encounter for therapeutic drug level monitoring: Secondary | ICD-10-CM

## 2013-08-16 DIAGNOSIS — Z79899 Other long term (current) drug therapy: Secondary | ICD-10-CM

## 2013-08-16 DIAGNOSIS — Z96659 Presence of unspecified artificial knee joint: Secondary | ICD-10-CM | POA: Insufficient documentation

## 2013-08-16 DIAGNOSIS — M75101 Unspecified rotator cuff tear or rupture of right shoulder, not specified as traumatic: Secondary | ICD-10-CM

## 2013-08-16 DIAGNOSIS — M25559 Pain in unspecified hip: Secondary | ICD-10-CM | POA: Insufficient documentation

## 2013-08-16 DIAGNOSIS — G2581 Restless legs syndrome: Secondary | ICD-10-CM

## 2013-08-16 DIAGNOSIS — M961 Postlaminectomy syndrome, not elsewhere classified: Secondary | ICD-10-CM

## 2013-08-16 NOTE — Telephone Encounter (Signed)
Jill Phillips was in for RN visit.  Her fentanyl 50 mcg Fill date  08/02/13 # 10  Today NV#2 and fentanyl 12 mcg #10 fill date 08/02/13  Today NV# 2.  These both are short by 3 patches each dosage . She also commented she is due to place new patches on today which actually makes the shortage # 4 of each.  She has no explanation for this.  She has had some work done in her house recently on her windows.  Denies significant other or family that could have done this.  Encouraged her to go and look again for these patches, but it is peculiar that there are 3 missing from each dose.  Refill given #10 for only the 50 mcg dosage only as she has discussed wanting to get off of them.  Hydrocodone bottle is empty though she says she has 7 at home in pill box.  Told to bring all meds to appt. Urine drug screen collected. Note sent to Dr Naaman Plummer

## 2013-08-16 NOTE — Progress Notes (Signed)
Here for pill count and medication refills. Her fentanyl 50 mcg Fill date  08/02/13 # 10  Today NV#2 and fentanyl 12 mcg #10 fill date 08/02/13  Today NV# 2.  These both are short by 3 patches each dosage . She also commented she is due to place new patches on today which actually makes the shortage # 4 of each.  She has no explanation for this.  She has had some work done in her house recently on her windows.  Denies significant other or family that could have done this.  Encouraged her to go and look again for these patches, but it is peculiar that there are 3 missing from each dose.  Refill given #10 for only the 50 mcg dosage as she has discussed wanting to get off of them.  Hydrocodone bottle is empty though she says she has 7 at home in pill box.  Told to bring all meds to appt. Urine drug screen collected.  Follow up one month med refill with RN

## 2013-08-16 NOTE — Patient Instructions (Signed)
Follow up one month with RN for pill count and med refill

## 2013-08-16 NOTE — Telephone Encounter (Signed)
Patient called she has looked everywhere to see if she had dropped or misplaced any of her patches.  She was unable to find any.

## 2013-08-16 NOTE — Progress Notes (Signed)
Here for pill count and medication refills. Her fentanyl 50 mcg Fill date  08/02/13 # 10  Today NV#2 and fentanyl 12 mcg #10 fill date 08/02/13  Today NV# 2.  These both are short by 3 patches each dosage . She also commented she is due to place new patches on today which actually makes the shortage # 4 of each.  She has no explanation for this.  She has had some work done in her house recently on her windows.  Denies significant other or family that could have done this.  Encouraged her to go and look again for these patches, but it is peculiar that there are 3 missing from each dose.  Refill given #10 for only the 50 mcg dosage as she has discussed wanting to get off of them.  Hydrocodone bottle is empty though she says she has 7 at home in pill box.  Told to bring all meds to appt. Urine drug screen collected.  Follow up one month med refill with RN Note sent to Dr Naaman Plummer about shortage.

## 2013-08-17 ENCOUNTER — Telehealth: Payer: Self-pay | Admitting: Neurology

## 2013-08-17 NOTE — Telephone Encounter (Signed)
Please print out new rx'es to get her on track. Also, can we put a work order into epic to change our default # to 10 on fentanyl. i am not sure why it's #5----this has caused problems on numerous occasions.   thanks

## 2013-08-17 NOTE — Telephone Encounter (Signed)
Patient called stating that she thinks her CPAP isn't working. Patient states that she is sleeping all the time and is falling asleep everywhere. Patient is wondering if she needs to come in for an appointment. Please call the patient, she is expecting a call.

## 2013-08-17 NOTE — Telephone Encounter (Signed)
Pharmacy called and stated they only dispensed #5 patches of the fentanyl 50 mcg and 5 patches of the 42mcg.  Epic Records were checked and only 5 patches were dispensed.   CS report showed this to be the case as well.  So Jill Phillips was not short on her medication. I notified her that she was not at fault.  It appears by looking back at notes there have been several occasions where she was dispensed # patches when it should have been #10.

## 2013-08-17 NOTE — Telephone Encounter (Signed)
Please remind her that it is her responsibility to take care of her meds. If she is missing doses again, we will be forced to wean off the patches completely.

## 2013-08-18 NOTE — Telephone Encounter (Signed)
Patient is on track with fentanyl rx.  Will contact Epic regarding medication change.

## 2013-08-20 ENCOUNTER — Telehealth: Payer: Self-pay | Admitting: Neurology

## 2013-08-20 NOTE — Telephone Encounter (Signed)
I called and talked to the patient. I suggested that I see her back next week on 08/27/2013 at 9:30 in the morning. She is advised to bring her machine and her carditis in the machine. She has had some problem with her hose leaking which has been fixed but she still is struggling with daytime somnolence. Of course this is a complicated situation and she is also on multiple sedating medications. She does need a followup and I told her to come next week and she was in agreement. Richardson Landry: please make appt

## 2013-08-27 ENCOUNTER — Ambulatory Visit (INDEPENDENT_AMBULATORY_CARE_PROVIDER_SITE_OTHER): Payer: Medicare Other | Admitting: Neurology

## 2013-08-27 ENCOUNTER — Encounter: Payer: Self-pay | Admitting: Neurology

## 2013-08-27 ENCOUNTER — Encounter (INDEPENDENT_AMBULATORY_CARE_PROVIDER_SITE_OTHER): Payer: Self-pay

## 2013-08-27 VITALS — BP 120/80 | HR 88 | Temp 98.8°F | Ht 64.0 in | Wt 153.0 lb

## 2013-08-27 DIAGNOSIS — G4730 Sleep apnea, unspecified: Secondary | ICD-10-CM

## 2013-08-27 DIAGNOSIS — G4733 Obstructive sleep apnea (adult) (pediatric): Secondary | ICD-10-CM

## 2013-08-27 DIAGNOSIS — G4731 Primary central sleep apnea: Secondary | ICD-10-CM

## 2013-08-27 DIAGNOSIS — G2581 Restless legs syndrome: Secondary | ICD-10-CM

## 2013-08-27 DIAGNOSIS — G4736 Sleep related hypoventilation in conditions classified elsewhere: Secondary | ICD-10-CM

## 2013-08-27 NOTE — Progress Notes (Signed)
Subjective:    Patient ID: Jill Phillips is a 57 y.o. female.  HPI    Interim history:   Jill Phillips is 57 year old right-handed woman with a complex medical history of scoliosis, status post multiple back surgeries, right hip and right knee replacement surgeries, chronic pain on narcotic pain medication, RLS, reflux disease, thyroid disease, hyperlipidemia, and hypertension, who presents for followup consultation of her complex sleep apnea after a recent PAP titration sleep study. She is accompanied by her friend, Barnabas Lister today. I last saw her on 06/04/2013, and which time we discussed her baseline sleep study results and I suggested that she return for full night CPAP titration to treat her sleep disordered breathing. Her baseline sleep study from 05/21/2013 showed a sleep efficiency of 88.6% with a normal sleep latency. Moderate sleep fragmentation was noted. She had an increased percentage of light stage sleep, absence of slow-wave sleep, and a decreased percentage of dream sleep with a normal REM latency. She had moderate to loud snoring. She had an AHI of 15.7 of which 44% of all respiratory events were central in nature. This was likely in part due to narcotic pain medication. Her baseline oxygen saturation was only 89% with a nadir of 82%. Since then, she had a CPAP titration study on 06/28/2013 and I discussed her test results with her in detail today: Her sleep efficiency was 90.1%. She fell asleep quickly. Wake after sleep onset was 40.5 minutes with mild to moderate sleep fragmentation noted. This was a difficult study to acquire and 2 interpreted because the patient had ongoing central respiratory events throughout the study despite trying different treatment modalities. She had ongoing hypoxemia and was given supplemental oxygen. She had no significant periodic leg movements. She had mostly central respiratory events. Her baseline oxygen saturation was 87%, her nadir was 77% during REM sleep  period time below 88% saturation was 4 hours 42 minutes and 55 seconds. She was initiated on CPAP at a pressure of 5 cm and titrated to 9 cm, then switch to standard BiPAP, then to BiPAP ST with a rate of 12 and supplemental oxygen was added later at 1 L then titrated to 2 L. She was even tried on ASV but did not tolerated and eventually was changed back to CPAP of 5, and 6 cm. All in all, she did not have resolution of her sleep disordered breathing but did fairly on CPAP of 6 cm with a PR of 1 and addition of supplemental oxygen at 2 L. Based on that I empirically prescribed CPAP for her with those settings. She called back to report that she was not doing well on CPAP. She also felt very sleepy during the day. She was advised to come in for this appointment. I reviewed her compliance data from her machine today: 07/19/2013 through 08/26/2013 which is a total of 39 days during which time she used CPAP every night except for 3 nights. Her percent used days greater than 4 hours was only 46%. Residual AHI is 5.1 per hour and her leak was at times quite high with the 95th percentile at 31.7 L per minute her average usage for all days was 3 hours and 20 minutes. Her CPAP pressure is at 6 with no CPR. She uses supplemental oxygen. She continues to smoke. Today, she reports, that her legs hurt more since her Fentanyl was reduced to 50. She is not using oxygen currently. She had sleep apnea surgery in 2005 and was on CPAP afterward,  but could not tolerate it. She said, "it did not do any good". She continues to be sleepy. She felt very well initially, when she started on CPAP and she had more daytime energy, less EDS. However, after the first week, she started having more EDS, less sleep consolidation and she has increased her O2 to 3 lpm at night with CPAP, she tries to use CPAP regularly but has noted that she pulls off the mask in the middle of the night not realizing. She drinks coffee at night sometimes.  I  originally met her at the request of her pain Dr. on 12/03/2012, at which time I suggested a sleep study. Based on her central respiratory events I did not think she would be a great candidate for CPAP therapy but would most likely have to try and fail CPAP before we could attempt bilevel PAP treatment.   Her Past Medical History Is Significant For: Past Medical History  Diagnosis Date  . Postlaminectomy syndrome, thoracic region   . Osteoarthrosis, unspecified whether generalized or localized, lower leg   . Dysthymic disorder   . Calcifying tendinitis of shoulder   . Pain in joint, upper arm   . Chronic pain syndrome   . Lumbago   . Primary localized osteoarthrosis, lower leg   . Hypertension   . Hyperlipidemia   . GERD (gastroesophageal reflux disease)   . Thyroid disease   . Restless leg syndrome   . PONV (postoperative nausea and vomiting)   . Heart murmur   . Sleep apnea     s/p surgery- last sleep study 2011- doesnt use oxygen or machine at night as instructed  . History of blood transfusion 1980  . Hypothyroidism   . Active smoker     Her Past Surgical History Is Significant For: Past Surgical History  Procedure Laterality Date  . Appendectomy    . Abdominal hysterectomy    . Tubal ligation    . Spine surgery      thoracic x 1,  lumbar x 15  . Right hip replacement    . Knee surgeries r knee      arthroscopy- right  . Hammer toe surgery    . Sleep apnea surgery    . Cholecystectomy    . Back surgery    . Joint replacement    . Total knee arthroplasty  05/29/2012    Procedure: TOTAL KNEE ARTHROPLASTY;  Surgeon: Mcarthur Rossetti, MD;  Location: [facility];  Service: Orthopedics;  Laterality: Right;  Right Total Knee Arthroplasty    Her Family History Is Significant For: Family History  Problem Relation Age of Onset  . Kidney disease Mother   . Heart disease Father   . Anuerysm Brother 29    brain  . Heart disease Brother   . Heart disease Sister 72    s/p  CABG  . Hypertension Sister     Her Social History Is Significant For: History   Social History  . Marital Status: Divorced    Spouse Name: n/a    Number of Children: 2  . Years of Education: 12+   Occupational History  . disability     back surgeries   Social History Main Topics  . Smoking status: Current Every Day Smoker -- 1.50 packs/day for 45 years    Types: Cigarettes  . Smokeless tobacco: Never Used  . Alcohol Use: No  . Drug Use: No  . Sexual Activity: Not Currently    Partners: Male  Other Topics Concern  . None   Social History Narrative   Lives alone.  One daughter is local, but is getting ready to move to [place], where her children live with their father.  The other daughter lives near [place], [place].    Her Allergies Are:  Allergies  Allergen Reactions  . Amoxicillin     Oral yeast infection  . Chlorzoxazone     headache  . Codeine     headache  . Darvocet [Propoxyphene N-Acetaminophen] Itching  . Dilaudid [Hydromorphone Hcl] Itching  . Flagyl [Metronidazole] Diarrhea  . Keflex [Cephalexin]   . Morphine And Related Itching  . Nitrofurantoin Monohyd Macro Hives  . Oxycodone-Acetaminophen Itching  . Percocet [Oxycodone-Acetaminophen] Itching  . Sulfa Antibiotics     Yeast infection in mouth  :   Her Current Medications Are:  Outpatient Encounter Prescriptions as of 08/27/2013  Medication Sig  . acetaminophen (TYLENOL) 500 MG tablet Take 1,000 mg by mouth every 6 (six) hours as needed. For pain  . ALPRAZolam (XANAX) 1 MG tablet Take 1 tablet by mouth as needed.  [name] amLODipine (NORVASC) 5 MG tablet TAKE 1 TABLET BY MOUTH ONCE DAILY.  . benazepril (LOTENSIN) 20 MG tablet Take 1 tablet (20 mg total) by mouth daily.  . carisoprodol (SOMA) 350 MG tablet TAKE 1 TABLET BY MOUTH EVERY 8 HOURS AS NEEDED  . clonazePAM (KLONOPIN) 2 MG tablet Take 2 tablets (4 mg total) by mouth at bedtime.  . cloNIDine (CATAPRES) 0.1 MG tablet TAKE 1 TABLET BY MOUTH 3  TIMES DAILY.  [name] docusate sodium (COLACE) 100 MG capsule Take 100 mg by mouth at bedtime.  [name] ESZOPICLONE 3 MG tablet 3 mg at bedtime.   . fentaNYL (DURAGESIC) 50 MCG/HR Place 1 patch (50 mcg total) onto the skin every 3 (three) days.  . hydrochlorothiazide (HYDRODIURIL) 12.5 MG tablet Take 1 tablet (12.5 mg total) by mouth at bedtime.  [name] HYDROcodone-acetaminophen (NORCO/VICODIN) 5-325 MG per tablet Take 1 tablet by mouth every 6 (six) hours as needed.  [name] levothyroxine (LEVOTHROID) 25 MCG tablet Take 1 tablet (25 mcg total) by mouth daily.  . naproxen (NAPROSYN) 500 MG tablet Take 500 mg by mouth at bedtime as needed (for pain).   [name] omeprazole (PRILOSEC) 20 MG capsule TAKE 1 CAPSULE BY MOUTH ONCE DAILY.  [name] OVER THE COUNTER MEDICATION Take 1 tablet by mouth daily. vegetables laxative  . pravastatin (PRAVACHOL) 20 MG tablet TAKE 1 TABLET BY MOUTH DAILY.  [name] promethazine (PHENERGAN) 25 MG tablet   . valACYclovir (VALTREX) 500 MG tablet TAKE 1 TABLET BY MOUTH DAILY.  [name] venlafaxine (EFFEXOR-XR) 150 MG 24 hr capsule Take 300 mg by mouth at bedtime.   . VOLTAREN 1 % GEL APPLY TO AFFECTED AREA 3 TIMES A DAY AS NEEDED.  [name] varenicline (CHANTIX) 1 MG tablet Take 1 tablet (1 mg total) by mouth 2 (two) times daily. Take 0.5 tab daily for 4 days then 0.5 tab twice daily for 4 days then increase to one full tab twice daily thereafter  . [DISCONTINUED] fentaNYL (DURAGESIC - DOSED MCG/HR) 12 MCG/HR Place 1 patch (12.5 mcg total) onto the skin every 3 (three) days.  :  Review of Systems:  Out of a complete 14 point review of systems, all are reviewed and negative with the exception of these symptoms as listed below:   Review of Systems  Constitutional: Positive for activity change, appetite change and fatigue.  HENT: Positive for rhinorrhea.   Eyes: Negative.  Respiratory: Positive for cough and wheezing.   Cardiovascular:       Murmur  Gastrointestinal: Negative.   Endocrine: Negative.   Genitourinary:  Negative.   Musculoskeletal: Negative.   Skin: Negative.   Allergic/Immunologic: Negative.   Neurological: Negative.   Hematological: Negative.   Psychiatric/Behavioral: Positive for sleep disturbance (restless leg, apnea, daytime sleepiness, snoring).    Objective:  Neurologic Exam  Physical Exam Physical Examination:   Filed Vitals:   08/27/13 0908  BP: 120/80  Pulse: 88  Temp: 98.8 F (37.1 C)   General Examination: The patient is a very pleasant 57 y.o. female in no acute distress. She is mildly anxious appearing.  HEENT: Normocephalic, atraumatic, pupils are equal, round and reactive to light and accommodation. Funduscopic exam is normal bilaterally. Extraocular tracking is good without nystagmus noted. Normal smooth pursuit is noted. Hearing is grossly intact. Face is symmetric with normal facial animation and normal facial sensation. Speech shows no dysarthria. There is no hypophonia. There is no lip, neck or jaw tremor. Neck is supple with full range of motion. There are no carotid bruits on auscultation. Oropharynx exam reveals mild pharyngeal erythema and her voice is slightly hoarse. No significant airway crowding is noted. Mallampati is class I. Uvula and tonsils are absent.  Chest: is clear to auscultation without wheezing, rhonchi or crackles noted.  Heart: sounds are regular and normal without murmurs, rubs or gallops noted.   Abdomen: is soft, non-tender and non-distended with normal bowel sounds appreciated on auscultation.  Extremities: There is no pitting edema in the distal lower extremities bilaterally.   Skin: is warm and dry with no trophic changes noted.  Musculoskeletal: exam reveals: She has an abnormal posture or with significant kyphosis noted in the lower lumbar region and scoliosis, she leans to the right as well.  Neurologically:  Mental status: The patient is awake, alert and oriented in all 4 spheres. Her memory, attention, language and  knowledge are appropriate. There is no aphasia, agnosia, apraxia or anomia. Speech is clear with normal prosody and enunciation. Thought process is linear. Mood is congruent and affect is normal.   Cranial nerves are as described above under HEENT exam. In addition, shoulder shrug is normal with equal shoulder height noted. Motor exam: Normal bulk, strength and tone is noted. There is no drift, tremor or rebound. Romberg is negative. Reflexes are 2+ throughout. Fine motor skills are intact with normal finger taps, normal hand movements, normal rapid alternating patting, normal foot taps and normal foot agility.  Cerebellar testing shows no dysmetria or intention tremor on finger to nose testing. there is no truncal or gait ataxia.  Sensory exam is intact to light touch, pinprick, vibration, temperature sense in the upper and lower extremities.  Gait, station and balance: abnormal posture with a stoop and inability to straighten up completely. She leans to the right, rather, her upper body is tilted to the right. She walks with difficulty and a limp. She is briefly able to pull himself up on her toes and heels and tandem walk is difficult for her.  Assessment and Plan:    In summary, Jill Phillips is a very pleasant 57 year old female with a complicated underlying medical history who has a history of excessive daytime somnolence and a recent sleep study confirming complex sleep apnea with a significant central component. She since then had a CPAP titration study during which she was tried on CPAP, standard BiPAP, BiPAP ST, and ASV. She did not have  resolution of her sleep disordered breathing on any modality but seemed to tolerate CPAP at low pressure with supplemental oxygen. On this setting she still has residual sleep apnea. Most likely this is central sleep apnea. I explained to her that we were not able to give her a higher CPAP pressure because of flareup of her central apneas. We will most likely  have to bring her back for a re\re titration. At this juncture, I would like for her to have a full night BiPAP titration study starting with a slow titration and maybe with BiPAP ST if needed. We should place her on supplemental oxygen from the get go at least at 2 L per minute. We know she has a long-standing history of hypoxemia. She is very strongly encouraged to quit smoking. She indicated that she would try. She reports a flareup in her leg pain since the reduction of her fentanyl. I also pointed out to her that she is on the higher dose of Effexor XR at night and SSRI type antidepressants and SNRI type antidepressants are known to worsen RLS symptoms and cause PLMS in some patients. I do believe that her central sleep apnea is a large part related to her narcotic pain medication use and her hypoxemia is largely do to her ongoing smoking. At this juncture I have asked her to come back for a full night re-titration study with positive airway treatment. I reiterated all the test results to her and explained the different treatment modalities for sleep apnea. She is encouraged to continue using her current CPAP at the current setting and based on her next sleep study I may be able to change her treatment modality and her settings. I will see her back after the sleep studies completed. She was in agreement. Most of my 30 minute visit today was spent in counseling and coordination of care, reviewing test results and reviewing medication.

## 2013-08-27 NOTE — Patient Instructions (Signed)
Please continue using your CPAP regularly. While your insurance requires that you use CPAP at least 4 hours each night on 70% of the nights, I recommend, that you not skip any nights and use it throughout the night if you can. Getting used to CPAP does take time and patience and discipline. Untreated obstructive sleep apnea when it is moderate to severe can have an adverse impact on cardiovascular health and raise her risk for heart disease, arrhythmias, hypertension, congestive heart failure, stroke and diabetes. Untreated obstructive sleep apnea causes sleep disruption, nonrestorative sleep, and sleep deprivation. This can have an impact on your day to day functioning and cause daytime sleepiness and impairment of cognitive function, memory loss, mood disturbance, and problems focussing. Using CPAP regularly can improve these symptoms.  We will bring you back for a BiPAP titration study to optimize your treatment.   You need to stop smoking!

## 2013-08-28 ENCOUNTER — Telehealth: Payer: Self-pay | Admitting: Neurology

## 2013-08-28 MED ORDER — GABAPENTIN 300 MG PO CAPS: 300.0000 mg | ORAL_CAPSULE | Freq: Two times a day (BID) | ORAL | Status: DC

## 2013-08-28 NOTE — Telephone Encounter (Signed)
I added gabapentin 300mg  bid for RLS symptoms

## 2013-08-28 NOTE — Telephone Encounter (Signed)
Patient arrived for her scheduled PAP appointment and was coughing loudly and stated she had bronchitis.  The manager Jacklynn Bue was contacted and the patient was sent home.  Patient requested to be contacted to re-schedule study.  DP

## 2013-08-30 ENCOUNTER — Telehealth: Payer: Self-pay

## 2013-08-30 ENCOUNTER — Encounter: Payer: Self-pay | Admitting: Neurology

## 2013-08-30 ENCOUNTER — Ambulatory Visit (INDEPENDENT_AMBULATORY_CARE_PROVIDER_SITE_OTHER): Payer: Medicare Other | Admitting: Emergency Medicine

## 2013-08-30 VITALS — BP 132/78 | HR 80 | Temp 98.4°F | Resp 18 | Ht 63.5 in | Wt 152.0 lb

## 2013-08-30 DIAGNOSIS — G2581 Restless legs syndrome: Secondary | ICD-10-CM

## 2013-08-30 DIAGNOSIS — M75101 Unspecified rotator cuff tear or rupture of right shoulder, not specified as traumatic: Secondary | ICD-10-CM

## 2013-08-30 DIAGNOSIS — J209 Acute bronchitis, unspecified: Secondary | ICD-10-CM

## 2013-08-30 DIAGNOSIS — F32A Depression, unspecified: Secondary | ICD-10-CM

## 2013-08-30 DIAGNOSIS — F329 Major depressive disorder, single episode, unspecified: Secondary | ICD-10-CM

## 2013-08-30 DIAGNOSIS — M2170 Unequal limb length (acquired), unspecified site: Secondary | ICD-10-CM

## 2013-08-30 DIAGNOSIS — J018 Other acute sinusitis: Secondary | ICD-10-CM

## 2013-08-30 DIAGNOSIS — M961 Postlaminectomy syndrome, not elsewhere classified: Secondary | ICD-10-CM

## 2013-08-30 MED ORDER — LEVOFLOXACIN 500 MG PO TABS: 500.0000 mg | ORAL_TABLET | Freq: Every day | ORAL | Status: AC

## 2013-08-30 MED ORDER — FENTANYL 50 MCG/HR TD PT72: 50.0000 ug | MEDICATED_PATCH | TRANSDERMAL | Status: DC

## 2013-08-30 MED ORDER — FENTANYL 12 MCG/HR TD PT72: 12.5000 ug | MEDICATED_PATCH | TRANSDERMAL | Status: DC

## 2013-08-30 MED ORDER — PSEUDOEPHEDRINE-GUAIFENESIN ER 60-600 MG PO TB12: 1.0000 | ORAL_TABLET | Freq: Two times a day (BID) | ORAL | Status: DC

## 2013-08-30 MED ORDER — GUAIFENESIN-DM 100-10 MG/5ML PO SYRP: 5.0000 mL | ORAL_SOLUTION | ORAL | Status: DC | PRN

## 2013-08-30 NOTE — Progress Notes (Signed)
[facility] [address], [place] [address] [phone]  Date:  08/30/2013   Name:  Jill Phillips   DOB:  1956-01-14   MRN:  [id number]  PCP:  [name], MD    Chief Complaint: Cough, chest congestion, Sore Throat, Otalgia and Generalized Body Aches   History of Present Illness:  Jill Phillips is a 57 y.o. very pleasant female patient who presents with the following:  Ill for four days with purulent nasal drainage and post nasal drip.  Has a productive cough and no wheezing or shortness of breath.  No nausea or vomiting. No fever or chills.  No rash or sepsis.  No flu shot.  Smokes.  No improvement with over the counter medications or other home remedies. Denies other complaint or health concern today.   Patient Active Problem List   Diagnosis Date Noted  . 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 Leg length discrepancy (right) 05/28/2013  . HTN (hypertension) 05/19/2013  . HLD (hyperlipidemia) 05/19/2013  . Depression 05/19/2013  . Hematuria, microscopic 05/19/2013  . Hypothyroidism 05/19/2013  . Healthcare maintenance 05/19/2013  . Active smoker   . Osteoarthritis of both knees 11/18/2011  . Rotator cuff syndrome of right shoulder 11/18/2011  . Lumbar post-laminectomy syndrome 11/18/2011  . Restless leg syndrome 11/18/2011    Past Medical History  Diagnosis Date  . Postlaminectomy syndrome, thoracic region   . Osteoarthrosis, unspecified whether generalized or localized, lower leg   . Dysthymic disorder   . Calcifying tendinitis of shoulder   . Pain in joint, upper arm   . Chronic pain syndrome   . Lumbago   . Primary localized osteoarthrosis, lower leg   . Hypertension   . Hyperlipidemia   . GERD (gastroesophageal reflux disease)   . Thyroid disease   . Restless leg syndrome   . PONV (postoperative nausea and vomiting)   . Heart murmur   . Sleep apnea     s/p surgery- last sleep study 2011- doesnt use oxygen or machine at night as instructed   . History of blood transfusion 1980  . Hypothyroidism   . Active smoker     Past Surgical History  Procedure Laterality Date  . Appendectomy    . Abdominal hysterectomy    . Tubal ligation    . Spine surgery      thoracic x 1,  lumbar x 15  . Right hip replacement    . Knee surgeries r knee      arthroscopy- right  . Hammer toe surgery    . Sleep apnea surgery    . Cholecystectomy    . Back surgery    . Joint replacement    . Total knee arthroplasty  05/29/2012    Procedure: TOTAL KNEE ARTHROPLASTY;  Surgeon: Mcarthur Rossetti, MD;  Location: [facility];  Service: Orthopedics;  Laterality: Right;  Right Total Knee Arthroplasty    History  Substance Use Topics  . Smoking status: Current Every Day Smoker -- 1.50 packs/day for 45 years    Types: Cigarettes  . Smokeless tobacco: Never Used  . Alcohol Use: No    Family History  Problem Relation Age of Onset  . Kidney disease Mother   . Heart disease Father   . Anuerysm Brother 29    brain  . Heart disease Brother   . Heart disease Sister 41    s/p CABG  . Hypertension Sister     Allergies  Allergen Reactions  .  Amoxicillin     Oral yeast infection  . Chlorzoxazone     headache  . Codeine     headache  . Darvocet [Propoxyphene N-Acetaminophen] Itching  . Dilaudid [Hydromorphone Hcl] Itching  . Flagyl [Metronidazole] Diarrhea  . Keflex [Cephalexin]   . Morphine And Related Itching  . Nitrofurantoin Monohyd Macro Hives  . Oxycodone-Acetaminophen Itching  . Percocet [Oxycodone-Acetaminophen] Itching  . Sulfa Antibiotics     Yeast infection in mouth    Medication list has been reviewed and updated.  Current Outpatient Prescriptions on File Prior to Visit  Medication Sig Dispense Refill  . acetaminophen (TYLENOL) 500 MG tablet Take 1,000 mg by mouth every 6 (six) hours as needed. For pain      . ALPRAZolam (XANAX) 1 MG tablet Take 1 tablet by mouth as needed.      [name] amLODipine (NORVASC) 5 MG tablet TAKE 1  TABLET BY MOUTH ONCE DAILY.  30 tablet  5  . benazepril (LOTENSIN) 20 MG tablet Take 1 tablet (20 mg total) by mouth daily.  90 tablet  3  . carisoprodol (SOMA) 350 MG tablet TAKE 1 TABLET BY MOUTH EVERY 8 HOURS AS NEEDED  90 tablet  3  . clonazePAM (KLONOPIN) 2 MG tablet Take 2 tablets (4 mg total) by mouth at bedtime.  60 tablet  3  . cloNIDine (CATAPRES) 0.1 MG tablet TAKE 1 TABLET BY MOUTH 3 TIMES DAILY.  90 tablet  3  . docusate sodium (COLACE) 100 MG capsule Take 100 mg by mouth at bedtime.      [name] ESZOPICLONE 3 MG tablet 3 mg at bedtime.       . fentaNYL (DURAGESIC - DOSED MCG/HR) 12 MCG/HR Place 1 patch (12.5 mcg total) onto the skin every 3 (three) days. A TOTAL OF 62MCG BETWEEN PATCHES  10 patch  0  . fentaNYL (DURAGESIC) 50 MCG/HR Place 1 patch (50 mcg total) onto the skin every 3 (three) days. A TOTAL OF 62MCG BETWEEN TWO PATCHES  10 patch  0  . gabapentin (NEURONTIN) 300 MG capsule Take 1 capsule (300 mg total) by mouth 2 (two) times daily.  60 capsule  5  . hydrochlorothiazide (HYDRODIURIL) 12.5 MG tablet Take 1 tablet (12.5 mg total) by mouth at bedtime.  90 tablet  3  . HYDROcodone-acetaminophen (NORCO/VICODIN) 5-325 MG per tablet Take 1 tablet by mouth every 6 (six) hours as needed.  45 tablet  0  . levothyroxine (LEVOTHROID) 25 MCG tablet Take 1 tablet (25 mcg total) by mouth daily.  90 tablet  3  . naproxen (NAPROSYN) 500 MG tablet Take 500 mg by mouth at bedtime as needed (for pain).       [name] omeprazole (PRILOSEC) 20 MG capsule TAKE 1 CAPSULE BY MOUTH ONCE DAILY.  30 capsule  11  . OVER THE COUNTER MEDICATION Take 1 tablet by mouth daily. vegetables laxative      . pravastatin (PRAVACHOL) 20 MG tablet TAKE 1 TABLET BY MOUTH DAILY.  30 tablet  5  . promethazine (PHENERGAN) 25 MG tablet       . valACYclovir (VALTREX) 500 MG tablet TAKE 1 TABLET BY MOUTH DAILY.  30 tablet  3  . varenicline (CHANTIX) 1 MG tablet Take 1 tablet (1 mg total) by mouth 2 (two) times daily. Take 0.5 tab  daily for 4 days then 0.5 tab twice daily for 4 days then increase to one full tab twice daily thereafter  60 tablet  2  . venlafaxine (EFFEXOR-XR)  150 MG 24 hr capsule Take 300 mg by mouth at bedtime.       . VOLTAREN 1 % GEL APPLY TO AFFECTED AREA 3 TIMES A DAY AS NEEDED.  300 g  3   No current facility-administered medications on file prior to visit.    Review of Systems:  As per HPI, otherwise negative.    Physical Examination: Filed Vitals:   08/30/13 1554  BP: 132/78  Pulse: 80  Temp: 98.4 F (36.9 C)  Resp: 18   Filed Vitals:   08/30/13 1554  Height: 5' 3.5" (1.613 m)  Weight: 152 lb (68.947 kg)   Body mass index is 26.5 kg/(m^2). Ideal Body Weight: Weight in (lb) to have BMI = 25: 143.1  GEN: WDWN, NAD, Non-toxic, A & O x 3 HEENT: Atraumatic, Normocephalic. Neck supple. No masses, No LAD. Ears and Nose: No external deformity. CV: RRR, No M/G/R. No JVD. No thrill. No extra heart sounds. PULM: CTA B, no wheezes, crackles, rhonchi. No retractions. No resp. distress. No accessory muscle use. ABD: S, NT, ND, +BS. No rebound. No HSM. EXTR: No c/c/e NEURO Normal gait.  PSYCH: Normally interactive. Conversant. Not depressed or anxious appearing.  Calm demeanor.    Assessment and Plan: Sinusitis Bronchitis augmentin mucinex d Phen c cod Diflucan  Signed,  Ellison Carwin, MD

## 2013-08-30 NOTE — Telephone Encounter (Signed)
Patient says she called this weekend and was told she could increase her patch.  She called to clarify.  Please advise.

## 2013-08-30 NOTE — Telephone Encounter (Signed)
Contacted patient to inform her that her RX was ready for pick up.

## 2013-08-30 NOTE — Telephone Encounter (Signed)
FENTANYL INCREASED BACK TO 62MCG

## 2013-08-30 NOTE — Patient Instructions (Signed)

## 2013-09-06 ENCOUNTER — Other Ambulatory Visit: Payer: Self-pay | Admitting: Physical Medicine & Rehabilitation

## 2013-09-08 ENCOUNTER — Telehealth: Payer: Self-pay | Admitting: *Deleted

## 2013-09-08 NOTE — Telephone Encounter (Signed)
Message copied by Cathi Roan on Wed Sep 08, 2013 11:32 AM ------      Message from: Dory Horn      Created: Tue Sep 07, 2013  2:40 PM      Regarding: Return call - URGENT        Pt scheduled for titration 09/08/2012- still having difficulty breathing out of nose.  Would like to know benefits of changing from nasal pillows to full face? ------

## 2013-09-08 NOTE — Telephone Encounter (Signed)
Pt describes she just finished her last antibiotic yesterday.  She has tried sleeping with CPAP for the past 3 nights, she doesn't feel near as congested as before but did wear a breathe right strip due to a deviated nasal septum.  She describes feeling like she isn't getting enough air through her nose and says she has to position her hose just right or she doesn't feel like she gets much at all.  She is using nasal pillows and doesn't particularly care for them.  She is still coughing at night, but that has decreased significantly.  She is not 100% by any means.  I advised her to postpone the titration study tonight for 7-10 days to give her more time to get well.  Explained it will help [place] reach the goals of this next study.  We want to see if different modes will be easier to tolerate for her.   She reports that she is not sleeping during the daytime and she was sleeping up to 18 hours before CPAP, so it is helping in that regard.  But she continues to have trouble with the air pressure, the mask, and how the settings feel.  She is going to stop by today for a mask fitting session.  I will give her several options to take home and try and we will reschedule her visit tonight for a future date.

## 2013-09-09 ENCOUNTER — Other Ambulatory Visit: Payer: Self-pay | Admitting: *Deleted

## 2013-09-09 DIAGNOSIS — G2581 Restless legs syndrome: Secondary | ICD-10-CM

## 2013-09-09 DIAGNOSIS — F329 Major depressive disorder, single episode, unspecified: Secondary | ICD-10-CM

## 2013-09-09 DIAGNOSIS — M961 Postlaminectomy syndrome, not elsewhere classified: Secondary | ICD-10-CM

## 2013-09-09 DIAGNOSIS — M75101 Unspecified rotator cuff tear or rupture of right shoulder, not specified as traumatic: Secondary | ICD-10-CM

## 2013-09-09 DIAGNOSIS — F32A Depression, unspecified: Secondary | ICD-10-CM

## 2013-09-09 DIAGNOSIS — M2170 Unequal limb length (acquired), unspecified site: Secondary | ICD-10-CM

## 2013-09-09 MED ORDER — FENTANYL 50 MCG/HR TD PT72: 50.0000 ug | MEDICATED_PATCH | TRANSDERMAL | Status: DC

## 2013-09-09 MED ORDER — CARISOPRODOL 350 MG PO TABS: ORAL_TABLET | ORAL | Status: DC

## 2013-09-09 MED ORDER — HYDROCODONE-ACETAMINOPHEN 5-325 MG PO TABS: 1.0000 | ORAL_TABLET | Freq: Four times a day (QID) | ORAL | Status: DC | PRN

## 2013-09-09 MED ORDER — FENTANYL 12 MCG/HR TD PT72: 12.5000 ug | MEDICATED_PATCH | TRANSDERMAL | Status: DC

## 2013-09-09 NOTE — Telephone Encounter (Signed)
RX printed early for controlled medication for the visit with RN on 09/14/13 (to be signed by MD)

## 2013-09-14 ENCOUNTER — Encounter: Payer: Self-pay | Admitting: *Deleted

## 2013-09-14 ENCOUNTER — Encounter: Payer: PRIVATE HEALTH INSURANCE | Attending: Physical Medicine & Rehabilitation | Admitting: *Deleted

## 2013-09-14 VITALS — BP 135/71 | HR 105 | Resp 14

## 2013-09-14 DIAGNOSIS — Z96659 Presence of unspecified artificial knee joint: Secondary | ICD-10-CM | POA: Insufficient documentation

## 2013-09-14 DIAGNOSIS — F329 Major depressive disorder, single episode, unspecified: Secondary | ICD-10-CM

## 2013-09-14 DIAGNOSIS — F32A Depression, unspecified: Secondary | ICD-10-CM

## 2013-09-14 DIAGNOSIS — R0902 Hypoxemia: Secondary | ICD-10-CM | POA: Insufficient documentation

## 2013-09-14 DIAGNOSIS — M62838 Other muscle spasm: Secondary | ICD-10-CM | POA: Insufficient documentation

## 2013-09-14 DIAGNOSIS — M7989 Other specified soft tissue disorders: Secondary | ICD-10-CM | POA: Insufficient documentation

## 2013-09-14 DIAGNOSIS — M75101 Unspecified rotator cuff tear or rupture of right shoulder, not specified as traumatic: Secondary | ICD-10-CM

## 2013-09-14 DIAGNOSIS — M961 Postlaminectomy syndrome, not elsewhere classified: Secondary | ICD-10-CM

## 2013-09-14 DIAGNOSIS — G2581 Restless legs syndrome: Secondary | ICD-10-CM

## 2013-09-14 DIAGNOSIS — M25569 Pain in unspecified knee: Secondary | ICD-10-CM | POA: Insufficient documentation

## 2013-09-14 DIAGNOSIS — M170 Bilateral primary osteoarthritis of knee: Secondary | ICD-10-CM

## 2013-09-14 NOTE — Patient Instructions (Addendum)
Follow up in one month  With Dr Naaman Plummer.  Contact Dr [name] about your knee and followup on your sleep study/pcp. It is imperative you get your sleep apnea situation straightened out!

## 2013-09-14 NOTE — Progress Notes (Signed)
Here for pill count and medication refills. Jill Phillips had to be brought up today in a wheel chair. This is unusual for her.  She was slumped over in the wheelchair in the lobby today.  When I brought her back to the room she was out of it and states that she has had problems with her cpap and that she has been so sleepy today.  She also said she had gotten out of bed during the night and couldn't get back in it because she kept falling asleep.  I asked her if she gets up and does things at night  While asleep but she denies this.  I was concerned about her getting into her medication and taking extra during the night without thinking about it.  She lives alone.  She says she keeps it put up and would not be easy to get to and is confident she would not do that.  She is also complaining of her knee hurting and thinks she has torn her meniscus again and her legs have some swelling in them.  I looked at her legs and they did have a 1 + edema in the lower legs and her left knee was definitely swollen but not hot or red.  She is planning to contact Dr [name] about this who did her TKR in her right knee. I reviewed her medications and did not find that she was taking anything obvious that would account for the general edema.  She does not take Lyrica, but does take 300mg  gabapentin bid.  This has really been helping her restless leg and thus she has not been taking the additional 36mcg of fentanyl patches (total of 89mcg) that she requested to go back to from a previous decrease to 50 mcg.  She had 5 patches that we destroyed with staff witness.  She also had on file a 40mcg  Fentanyl patch #10 at the pharmacy that I verified with the pharmacy.  Therefore I did not give any new rx for fentanyl today The current box she brought in was filled  08/18/13  Today NV#1     Her hydrocodone was filled 08/18/13 #45 and she had #0 today. VSS.  She is just sleepy.  She is supposed to go back for a second sleep study because of her hypoxia  but has had problems with sinus infection and congestion so they would not schedule it until this is clear.  I urged her to follow up on this as soon as possible.  I am concerned about sedation on top of this.  I reviewed safety measures with her and how important it was for her to get this addressed and sooner not later.  She is going to follow up with her pcp about this and Dr [name] with her knee.  She is also complaining with her back and spasms which is partly why she needed the wheelchair.  I asked if she was taking the soma and she says she only takes this at night.  The rx given today was for the hydrocodone 5/325 # 23 and the Soma.  I destroyed the printed rx for the fentanyls which I had planned to give her today.  I informed Dr Naaman Plummer of the visit today.  Her next appt is to be with him 10/15/13.

## 2013-10-01 ENCOUNTER — Ambulatory Visit: Payer: Medicare Other

## 2013-10-01 ENCOUNTER — Telehealth: Payer: Self-pay | Admitting: *Deleted

## 2013-10-01 ENCOUNTER — Ambulatory Visit (INDEPENDENT_AMBULATORY_CARE_PROVIDER_SITE_OTHER): Payer: Medicare Other | Admitting: Family Medicine

## 2013-10-01 VITALS — BP 126/80 | HR 80 | Temp 98.0°F | Resp 16 | Ht 63.5 in | Wt 155.0 lb

## 2013-10-01 DIAGNOSIS — J019 Acute sinusitis, unspecified: Secondary | ICD-10-CM

## 2013-10-01 DIAGNOSIS — R059 Cough, unspecified: Secondary | ICD-10-CM

## 2013-10-01 DIAGNOSIS — R05 Cough: Secondary | ICD-10-CM

## 2013-10-01 MED ORDER — AZITHROMYCIN 250 MG PO TABS: ORAL_TABLET | ORAL | Status: DC

## 2013-10-01 NOTE — Patient Instructions (Signed)
Cough, Adult  A cough is a reflex that helps clear your throat and airways. It can help heal the body or may be a reaction to an irritated airway. A cough may only last 2 or 3 weeks (acute) or may last more than 8 weeks (chronic).  CAUSES Acute cough:  Viral or bacterial infections. Chronic cough:  Infections.  Allergies.  Asthma.  Post-nasal drip.  Smoking.  Heartburn or acid reflux.  Some medicines.  Chronic lung problems (COPD).  Cancer. SYMPTOMS   Cough.  Fever.  Chest pain.  Increased breathing rate.  High-pitched whistling sound when breathing (wheezing).  Colored mucus that you cough up (sputum). TREATMENT   A bacterial cough may be treated with antibiotic medicine.  A viral cough must run its course and will not respond to antibiotics.  Your caregiver may recommend other treatments if you have a chronic cough. HOME CARE INSTRUCTIONS   Only take over-the-counter or prescription medicines for pain, discomfort, or fever as directed by your caregiver. Use cough suppressants only as directed by your caregiver.  Use a cold steam vaporizer or humidifier in your bedroom or home to help loosen secretions.  Sleep in a semi-upright position if your cough is worse at night.  Rest as needed.  Stop smoking if you smoke. SEEK IMMEDIATE MEDICAL CARE IF:   You have pus in your sputum.  Your cough starts to worsen.  You cannot control your cough with suppressants and are losing sleep.  You begin coughing up blood.  You have difficulty breathing.  You develop pain which is getting worse or is uncontrolled with medicine.  You have a fever. MAKE SURE YOU:   Understand these instructions.  Will watch your condition.  Will get help right away if you are not doing well or get worse. Document Released: 02/15/2011 Document Revised: 11/11/2011 Document Reviewed: 02/15/2011 Exit[employer] Patient Information 2014 [employer]. Sinusitis Sinusitis is redness,  soreness, and swelling (inflammation) of the paranasal sinuses. Paranasal sinuses are air pockets within the bones of your face (beneath the eyes, the middle of the forehead, or above the eyes). In healthy paranasal sinuses, mucus is able to drain out, and air is able to circulate through them by way of your nose. However, when your paranasal sinuses are inflamed, mucus and air can become trapped. This can allow bacteria and other germs to grow and cause infection. Sinusitis can develop quickly and last only a short time (acute) or continue over a long period (chronic). Sinusitis that lasts for more than 12 weeks is considered chronic.  CAUSES  Causes of sinusitis include:  Allergies.  Structural abnormalities, such as displacement of the cartilage that separates your nostrils (deviated septum), which can decrease the air flow through your nose and sinuses and affect sinus drainage.  Functional abnormalities, such as when the small hairs (cilia) that line your sinuses and help remove mucus do not work properly or are not present. SYMPTOMS  Symptoms of acute and chronic sinusitis are the same. The primary symptoms are pain and pressure around the affected sinuses. Other symptoms include:  Upper toothache.  Earache.  Headache.  Bad breath.  Decreased sense of smell and taste.  A cough, which worsens when you are lying flat.  Fatigue.  Fever.  Thick drainage from your nose, which often is green and may contain pus (purulent).  Swelling and warmth over the affected sinuses. DIAGNOSIS  Your caregiver will perform a physical exam. During the exam, your caregiver may:  Look in   your nose for signs of abnormal growths in your nostrils (nasal polyps).  Tap over the affected sinus to check for signs of infection.  View the inside of your sinuses (endoscopy) with a special imaging device with a light attached (endoscope), which is inserted into your sinuses. If your caregiver suspects  that you have chronic sinusitis, one or more of the following tests may be recommended:  Allergy tests.  Nasal culture A sample of mucus is taken from your nose and sent to a lab and screened for bacteria.  Nasal cytology A sample of mucus is taken from your nose and examined by your caregiver to determine if your sinusitis is related to an allergy. TREATMENT  Most cases of acute sinusitis are related to a viral infection and will resolve on their own within 10 days. Sometimes medicines are prescribed to help relieve symptoms (pain medicine, decongestants, nasal steroid sprays, or saline sprays).  However, for sinusitis related to a bacterial infection, your caregiver will prescribe antibiotic medicines. These are medicines that will help kill the bacteria causing the infection.  Rarely, sinusitis is caused by a fungal infection. In theses cases, your caregiver will prescribe antifungal medicine. For some cases of chronic sinusitis, surgery is needed. Generally, these are cases in which sinusitis recurs more than 3 times per year, despite other treatments. HOME CARE INSTRUCTIONS   Drink plenty of water. Water helps thin the mucus so your sinuses can drain more easily.  Use a humidifier.  Inhale steam 3 to 4 times a day (for example, sit in the bathroom with the shower running).  Apply a warm, moist washcloth to your face 3 to 4 times a day, or as directed by your caregiver.  Use saline nasal sprays to help moisten and clean your sinuses.  Take over-the-counter or prescription medicines for pain, discomfort, or fever only as directed by your caregiver. SEEK IMMEDIATE MEDICAL CARE IF:  You have increasing pain or severe headaches.  You have nausea, vomiting, or drowsiness.  You have swelling around your face.  You have vision problems.  You have a stiff neck.  You have difficulty breathing. MAKE SURE YOU:   Understand these instructions.  Will watch your condition.  Will get  help right away if you are not doing well or get worse. Document Released: 08/19/2005 Document Revised: 11/11/2011 Document Reviewed: 09/03/2011 [employer] Patient Information 2014 [employer].  

## 2013-10-01 NOTE — Telephone Encounter (Signed)
Message copied by Cathi Roan on Fri Oct 01, 2013  8:47 AM ------      Message from: Dory Horn      Created: Fri Oct 01, 2013  8:27 AM      Regarding: Advise whether to cancel sleep study       Please return call to patient.  She needs to know if she needs to cancel the sleep study or not.  Also, she has not been using her cpap ------

## 2013-10-01 NOTE — Telephone Encounter (Signed)
Called and spoke to Jill Jill Phillips, she has been sick and was seen at [facility] by Jill Jill Phillips 08/30/14 for acute sinusitis.  She presented to the sleep Jill Phillips prior to that on 08/27/14 for repeat CPAP titration at Dr. Guadelupe Phillips request, but had a fever higher than 100 degrees and was sent home because she was ill.  Dr. Carlota Jill Phillips prescribed Levaquin 500 mg and Mucinex D for acute sinusitis to help her get well.  We rescheduled her CPAP study for 09/08/13 however, the patient called that day and stated that she was still not feeling well and was concerned about coughing and nasal congestion affecting her ability to complete the test.  I agreed that we should wait 7-10 days to allow her to tolerate the testing as best as possible.  We rescheduled the test for 10/01/13.  In the meantime, there is a visit with pain management which is concerning in which the patient presented so excessively sleepy they advised her to get her sleep apnea managed as soon as possible.  They were very concerned about sedation with her medications and her untreated sleep apnea.  Patient is high risk for central sleep apnea caused by narcotic medication and also CO2 retention/hypoventilation which may exist due to possible compromised lung function as a result of her 45 year history as a smoker.  The patient calls today and is concerned because she is still not well.  She is still coughing and has developed wheezing (she did not have this before when seen on 08/30/14).  She denies fever but states that nasal congestion is problematic and she is "blowing her nose all the time".  She is concerned about doing the test tonight.  Has not been able to use her CPAP.  She reports she started taking Amoxicillin on Sunday but hadn't noted any improvements yet.  I asked many questions and was able to determine that the Amoxicillin is her daughters prescription from a past illness that wasn't finished.  She also has not been seen for this illness since 08/30/14.   She has an appointment with her new PCP on 10/22/14 Jill Jill Phillips.    I advised the patient to be seen today at urgent care, once she has visited with the doctor, she is to contact me and we will determine together the best course of action about tonight.  Told her I will be here all day so she can call me no matter what time it is and I will be here after hours and the answering service will contact me if needed.  She will call for a ride to get the process started.   Pt states she has an inhaler but doesn't use it very often, I advised her to use it regularly to help open her airway and get rid of her congestion.  She is a current daily smoker (1.5 PPD).  She has multiple risk factors for significant obstructive and central sleep apnea and her sleep apnea has not been well treated at her current therapy settings.  Dr. Rexene Phillips wants the retitration in order to determine if more advanced modes of therapy are needed to help control obstructive v. Central apneas and also hypoxemia.

## 2013-10-01 NOTE — Telephone Encounter (Signed)
Patient called to let me know that she had gone to [facility] and been advised to cancel her appointment tonight.  This will be cancelled.  She will contact me after her appointment at [facility] (scheduled for 12:30 PM today) to let me know when she can reschedule.

## 2013-10-01 NOTE — Progress Notes (Signed)
Chief Complaint:  Chief Complaint  Patient presents with  . Sinus Congestion    X 1 month  . Chest Congestion    X 1 month    HPI: Jill Phillips is a 58 y.o. female who is here for  URI sxs , sinus congestion and chest congestion. She feels terrible because she has lal this congestiona nd also has sleep apnea.  She has sleep and central apnea but the CPAP is not working. She is due for a sleep study bit since she is not better with her URI sxs she can can't get the sleep study She ontinues to sleep, cough is yellow. Denies fevers, but has ahd chills. Has tried otc meds without releif.   Past Medical History  Diagnosis Date  . Postlaminectomy syndrome, thoracic region   . Osteoarthrosis, unspecified whether generalized or localized, lower leg   . Dysthymic disorder   . Calcifying tendinitis of shoulder   . Pain in joint, upper arm   . Chronic pain syndrome   . Lumbago   . Primary localized osteoarthrosis, lower leg   . Hypertension   . Hyperlipidemia   . GERD (gastroesophageal reflux disease)   . Thyroid disease   . Restless leg syndrome   . PONV (postoperative nausea and vomiting)   . Heart murmur   . Sleep apnea     s/p surgery- last sleep study 2011- doesnt use oxygen or machine at night as instructed  . History of blood transfusion 1980  . Hypothyroidism   . Active smoker    Past Surgical History  Procedure Laterality Date  . Appendectomy    . Abdominal hysterectomy    . Tubal ligation    . Spine surgery      thoracic x 1,  lumbar x 15  . Right hip replacement    . Knee surgeries r knee      arthroscopy- right  . Hammer toe surgery    . Sleep apnea surgery    . Cholecystectomy    . Back surgery    . Joint replacement    . Total knee arthroplasty  05/29/2012    Procedure: TOTAL KNEE ARTHROPLASTY;  Surgeon: Mcarthur Rossetti, MD;  Location: [facility];  Service: Orthopedics;  Laterality: Right;  Right Total Knee Arthroplasty   History   Social  History  . Marital Status: Divorced    Spouse Name: n/a    Number of Children: 2  . Years of Education: 12+   Occupational History  . disability     back surgeries   Social History Main Topics  . Smoking status: Current Every Day Smoker -- 1.50 packs/day for 45 years    Types: Cigarettes  . Smokeless tobacco: Never Used  . Alcohol Use: No  . Drug Use: No  . Sexual Activity: Not Currently    Partners: Male   Other Topics Concern  . None   Social History Narrative   Lives alone.  One daughter is local, but is getting ready to move to [place], where her children live with their father.  The other daughter lives near [place], [place].   Family History  Problem Relation Age of Onset  . Kidney disease Mother   . Heart disease Father   . Anuerysm Brother 29    brain  . Heart disease Brother   . Heart disease Sister 59    s/p CABG  . Hypertension Sister    Allergies  Allergen Reactions  .  Amoxicillin     Oral yeast infection  . Chlorzoxazone     headache  . Codeine     headache  . Darvocet [Propoxyphene N-Acetaminophen] Itching  . Dilaudid [Hydromorphone Hcl] Itching  . Flagyl [Metronidazole] Diarrhea  . Keflex [Cephalexin]   . Morphine And Related Itching  . Nitrofurantoin Monohyd Macro Hives  . Oxycodone-Acetaminophen Itching  . Percocet [Oxycodone-Acetaminophen] Itching  . Sulfa Antibiotics     Yeast infection in mouth   Prior to Admission medications   Medication Sig Start Date End Date Taking? Authorizing Provider  acetaminophen (TYLENOL) 500 MG tablet Take 1,000 mg by mouth every 6 (six) hours as needed. For pain    Historical Provider, MD  ALPRAZolam Duanne Moron) 1 MG tablet Take 1 tablet by mouth as needed. 05/27/13   Historical Provider, MD  amLODipine (NORVASC) 5 MG tablet TAKE 1 TABLET BY MOUTH ONCE DAILY. 03/31/13   Susy Frizzle, MD  benazepril (LOTENSIN) 20 MG tablet Take 1 tablet (20 mg total) by mouth daily. 06/23/13 06/23/14  Karren Cobble, MD    carisoprodol (SOMA) 350 MG tablet TAKE 1 TABLET BY MOUTH EVERY 8 HOURS AS NEEDED 09/09/13   Meredith Staggers, MD  clonazePAM (KLONOPIN) 2 MG tablet Take 2 tablets (4 mg total) by mouth at bedtime. 07/19/13   Meredith Staggers, MD  cloNIDine (CATAPRES) 0.1 MG tablet TAKE 1 TABLET BY MOUTH 3 TIMES DAILY. 08/16/13   Susy Frizzle, MD  docusate sodium (COLACE) 100 MG capsule Take 100 mg by mouth at bedtime.    Historical Provider, MD  ESZOPICLONE 3 MG tablet 3 mg at bedtime.  09/28/12   Historical Provider, MD  fentaNYL (DURAGESIC) 50 MCG/HR Place 1 patch (50 mcg total) onto the skin every 3 (three) days. A TOTAL OF 62MCG BETWEEN TWO PATCHES 09/09/13   Meredith Staggers, MD  gabapentin (NEURONTIN) 300 MG capsule Take 1 capsule (300 mg total) by mouth 2 (two) times daily. 08/28/13   Meredith Staggers, MD  guaiFENesin-dextromethorphan (ROBITUSSIN DM) 100-10 MG/5ML syrup Take 5 mLs by mouth every 4 (four) hours as needed for cough. 08/30/13   Ellison Carwin, MD  hydrochlorothiazide (HYDRODIURIL) 12.5 MG tablet Take 1 tablet (12.5 mg total) by mouth at bedtime. 06/23/13   Karren Cobble, MD  HYDROcodone-acetaminophen (NORCO/VICODIN) 5-325 MG per tablet Take 1 tablet by mouth every 6 (six) hours as needed. 09/09/13   Meredith Staggers, MD  levothyroxine (LEVOTHROID) 25 MCG tablet Take 1 tablet (25 mcg total) by mouth daily. 06/23/13 06/23/14  Karren Cobble, MD  naproxen (NAPROSYN) 500 MG tablet Take 500 mg by mouth at bedtime as needed (for pain).  11/09/12   Historical Provider, MD  omeprazole (PRILOSEC) 20 MG capsule TAKE 1 CAPSULE BY MOUTH ONCE DAILY. 04/06/13   Susy Frizzle, MD  OVER THE COUNTER MEDICATION Take 1 tablet by mouth daily. vegetables laxative    Historical Provider, MD  pravastatin (PRAVACHOL) 20 MG tablet TAKE 1 TABLET BY MOUTH DAILY. 05/24/13   Susy Frizzle, MD  promethazine (PHENERGAN) 25 MG tablet  03/03/13   Historical Provider, MD  pseudoephedrine-guaifenesin (MUCINEX D) 60-600 MG  per tablet Take 1 tablet by mouth every 12 (twelve) hours. 08/30/13 08/30/14  Ellison Carwin, MD  valACYclovir (VALTREX) 500 MG tablet TAKE 1 TABLET BY MOUTH DAILY. 07/14/13   Olga Millers, MD  venlafaxine (EFFEXOR-XR) 150 MG 24 hr capsule Take 300 mg by mouth at bedtime.     Historical Provider, MD  VOLTAREN 1 % GEL APPLY TO AFFECTED AREA 3 TIMES A DAY AS NEEDED. 09/06/13   Meredith Staggers, MD     ROS: The patient denies fevers,  night sweats, unintentional weight loss, chest pain, palpitations,  dyspnea on exertion, nausea, vomiting, abdominal pain, dysuria, hematuria, melena, numbness, weakness, or tingling.   All other systems have been reviewed and were otherwise negative with the exception of those mentioned in the HPI and as above.    PHYSICAL EXAM: Filed Vitals:   10/01/13 1341  BP: 126/80  Pulse: 80  Temp: 98 F (36.7 C)  Resp: 16  Spo2   95% Filed Vitals:   10/01/13 1341  Height: 5' 3.5" (1.613 m)  Weight: 155 lb (70.308 kg)   Body mass index is 27.02 kg/(m^2).  General: Alert, no acute distress HEENT:  Normocephalic, atraumatic, oropharynx patent. EOMI, PERRLA, TM nl, no exudates. + erythematous throat, left sinus tenderness Cardiovascular:  Regular rate and rhythm, no rubs murmurs or gallops.  No Carotid bruits, radial pulse intact. No pedal edema.  Respiratory: Clear to auscultation bilaterally.  No wheezes, rales, or rhonchi.  No cyanosis, no use of accessory musculature GI: No organomegaly, abdomen is soft and non-tender, positive bowel sounds.  No masses. Skin: No rashes. Neurologic: Facial musculature symmetric. Psychiatric: Patient is appropriate throughout our interaction. Lymphatic: No cervical lymphadenopathy Musculoskeletal: Gait intact.   LABS: Results for orders placed in visit on 05/06/13  URINE CULTURE      Result Value Range   Colony Count NO GROWTH     Organism ID, Bacteria NO GROWTH    COMPREHENSIVE METABOLIC PANEL      Result Value  Range   Sodium 140  135 - 145 mEq/L   Potassium 3.8  3.5 - 5.3 mEq/L   Chloride 101  96 - 112 mEq/L   CO2 33 (*) 19 - 32 mEq/L   Glucose, Bld 78  70 - 99 mg/dL   BUN 26 (*) 6 - 23 mg/dL   Creat 1.00  0.50 - 1.10 mg/dL   Total Bilirubin 0.4  0.3 - 1.2 mg/dL   Alkaline Phosphatase 91  39 - 117 U/L   AST 20  0 - 37 U/L   ALT 15  0 - 35 U/L   Total Protein 7.8  6.0 - 8.3 g/dL   Albumin 5.0  3.5 - 5.2 g/dL   Calcium 9.9  8.4 - 10.5 mg/dL  TSH      Result Value Range   TSH 3.406  0.350 - 4.500 uIU/mL  POCT CBC      Result Value Range   WBC 11.0 (*) 4.6 - 10.2 K/uL   Lymph, poc 3.6 (*) 0.6 - 3.4   POC LYMPH PERCENT 32.5  10 - 50 %L   MID (cbc) 0.7  0 - 0.9   POC MID % 6.6  0 - 12 %M   POC Granulocyte 6.7  2 - 6.9   Granulocyte percent 60.9  37 - 80 %G   RBC 4.34  4.04 - 5.48 M/uL   Hemoglobin 13.6  12.2 - 16.2 g/dL   HCT, POC 42.3  37.7 - 47.9 %   MCV 97.4 (*) 80 - 97 fL   MCH, POC 31.3 (*) 27 - 31.2 pg   MCHC 32.2  31.8 - 35.4 g/dL   RDW, POC 14.6     Platelet Count, POC 302  142 - 424 K/uL   MPV 8.2  0 - 99.8 fL  POCT  UA - MICROSCOPIC ONLY      Result Value Range   WBC, Ur, HPF, POC 0-1     RBC, urine, microscopic 3-5     Bacteria, U Microscopic neg     Mucus, UA neg     Epithelial cells, urine per micros 1-8     Crystals, Ur, HPF, POC neg     Casts, Ur, LPF, POC neg     Yeast, UA neg    POCT URINALYSIS DIPSTICK      Result Value Range   Color, UA yellow     Clarity, UA clear     Glucose, UA neg     Bilirubin, UA neg     Ketones, UA neg     Spec Grav, UA 1.025     Blood, UA eng     pH, UA 6.0     Protein, UA neg     Urobilinogen, UA 0.2     Nitrite, UA neg     Leukocytes, UA Negative       EKG/XRAY:   Primary read interpreted by Dr. Marin Comment at [facility]. No acute cardioplumnoary process   ASSESSMENT/PLAN: Encounter Diagnoses  Name Primary?  . Cough Yes  . Sinusitis, acute    Has had this before z pack Nasacort otc Cepachol prn Advise to stop smoking F/u  prn   Gross sideeffects, risk and benefits, and alternatives of medications d/w patient. Patient is aware that all medications have potential sideeffects and we are unable to predict every sideeffect or drug-drug interaction that may occur.  Lataunya Ruud, [place], DO 10/01/2013 3:55 PM

## 2013-10-10 ENCOUNTER — Ambulatory Visit (INDEPENDENT_AMBULATORY_CARE_PROVIDER_SITE_OTHER): Payer: Medicare Other | Admitting: Family Medicine

## 2013-10-10 VITALS — BP 114/68 | HR 95 | Temp 97.8°F | Resp 18 | Ht 64.0 in | Wt 153.0 lb

## 2013-10-10 DIAGNOSIS — R300 Dysuria: Secondary | ICD-10-CM

## 2013-10-10 DIAGNOSIS — N390 Urinary tract infection, site not specified: Secondary | ICD-10-CM

## 2013-10-10 DIAGNOSIS — R81 Glycosuria: Secondary | ICD-10-CM

## 2013-10-10 LAB — POCT CBC
Granulocyte percent: 51.4 %G (ref 37–80)
HCT, POC: 39.8 % (ref 37.7–47.9)
Hemoglobin: 12 g/dL — AB (ref 12.2–16.2)
Lymph, poc: 3.2 (ref 0.6–3.4)
MCH, POC: 30 pg (ref 27–31.2)
MCHC: 30.2 g/dL — AB (ref 31.8–35.4)
MCV: 99.6 fL — AB (ref 80–97)
MID (cbc): 0.5 (ref 0–0.9)
MPV: 9.1 fL (ref 0–99.8)
POC Granulocyte: 3.9 (ref 2–6.9)
POC LYMPH PERCENT: 42.6 %L (ref 10–50)
POC MID %: 6 %M (ref 0–12)
Platelet Count, POC: 236 10*3/uL (ref 142–424)
RBC: 4 M/uL — AB (ref 4.04–5.48)
RDW, POC: 13.9 %
WBC: 7.6 10*3/uL (ref 4.6–10.2)

## 2013-10-10 LAB — POCT UA - MICROSCOPIC ONLY
Crystals, Ur, HPF, POC: NEGATIVE
Mucus, UA: POSITIVE
Yeast, UA: NEGATIVE

## 2013-10-10 LAB — POCT URINALYSIS DIPSTICK
Blood, UA: NEGATIVE
Glucose, UA: 100
Leukocytes, UA: NEGATIVE
Nitrite, UA: POSITIVE
Protein, UA: 30
Spec Grav, UA: 1.02
Urobilinogen, UA: 2
pH, UA: 5

## 2013-10-10 LAB — POCT GLYCOSYLATED HEMOGLOBIN (HGB A1C): Hemoglobin A1C: 5.9

## 2013-10-10 MED ORDER — CIPROFLOXACIN HCL 500 MG PO TABS: 500.0000 mg | ORAL_TABLET | Freq: Two times a day (BID) | ORAL | Status: DC

## 2013-10-10 MED ORDER — CEFTRIAXONE SODIUM 1 G IJ SOLR
1.0000 g | Freq: Once | INTRAMUSCULAR | Status: AC
  Administered 2013-10-10: 1 g via INTRAMUSCULAR

## 2013-10-10 NOTE — Progress Notes (Signed)
Subjective:    Patient ID: Jill Phillips, female    DOB: 01-03-1956, 58 y.o.   MRN: [id number]  HPI presents today with lower abdominal pain "bladder pain". Pain started around 20th of January. Pain is very sharp and lasts for a while. Has been wearing depends to bed at night because she has been wetting herself during the night. Has to get up once or twice during the night to urinate. Does not have any burning with urination. No N/V/D. No fever or chills. Has had previous "bladder infections" or "kidney infections" and this is the same pain she had with those. Has been using AZO with very little relief. She states she may be going more frequently but not sure. She was having these symptoms when she was seen last 10/01/13 for sinusitis and "thought the abx that was prescribed then would clear it up".  Is not a very good historian She took some amoxacillin that her daughter ahd left over and she felt better.  Deneis fevers, n/v.  Review of Systems     Objective:   Physical Exam Results for orders placed in visit on 10/10/13  POCT URINALYSIS DIPSTICK      Result Value Range   Color, UA orange     Clarity, UA clear     Glucose, UA 100     Bilirubin, UA small     Ketones, UA trAce     Spec Grav, UA 1.020     Blood, UA NEG     pH, UA 5.0     Protein, UA 30     Urobilinogen, UA 2.0     Nitrite, UA POS     Leukocytes, UA Negative    POCT UA - MICROSCOPIC ONLY      Result Value Range   WBC, Ur, HPF, POC 1-4     RBC, urine, microscopic 7-9     Bacteria, U Microscopic 1+     Mucus, UA POS     Epithelial cells, urine per micros 1-2     Crystals, Ur, HPF, POC NEG     Casts, Ur, LPF, POC HYALINE     Yeast, UA NEG    POCT CBC      Result Value Range   WBC 7.6  4.6 - 10.2 K/uL   Lymph, poc 3.2  0.6 - 3.4   POC LYMPH PERCENT 42.6  10 - 50 %L   MID (cbc) 0.5  0 - 0.9   POC MID % 6.0  0 - 12 %M   POC Granulocyte 3.9  2 - 6.9   Granulocyte percent 51.4  37 - 80 %G   RBC 4.00 (*) 4.04 -  5.48 M/uL   Hemoglobin 12.0 (*) 12.2 - 16.2 g/dL   HCT, POC 39.8  37.7 - 47.9 %   MCV 99.6 (*) 80 - 97 fL   MCH, POC 30.0  27 - 31.2 pg   MCHC 30.2 (*) 31.8 - 35.4 g/dL   RDW, POC 13.9     Platelet Count, POC 236  142 - 424 K/uL   MPV 9.1  0 - 99.8 fL  POCT GLYCOSYLATED HEMOGLOBIN (HGB A1C)      Result Value Range   Hemoglobin A1C 5.9            Assessment & Plan:  UTI-ROcephin 1 gram IM x 1 in office Rx Cipro 500 mg BID x 10 days Push fluids F/u if no improvement Urine cx pending HbA1c and CBC  pending

## 2013-10-10 NOTE — Patient Instructions (Signed)
Urinary Tract Infection  Urinary tract infections (UTIs) can develop anywhere along your urinary tract. Your urinary tract is your body's drainage system for removing wastes and extra water. Your urinary tract includes two kidneys, two ureters, a bladder, and a urethra. Your kidneys are a pair of bean-shaped organs. Each kidney is about the size of your fist. They are located below your ribs, one on each side of your spine.  CAUSES  Infections are caused by microbes, which are microscopic organisms, including fungi, viruses, and bacteria. These organisms are so small that they can only be seen through a microscope. Bacteria are the microbes that most commonly cause UTIs.  SYMPTOMS   Symptoms of UTIs may vary by age and gender of the patient and by the location of the infection. Symptoms in young women typically include a frequent and intense urge to urinate and a painful, burning feeling in the bladder or urethra during urination. Older women and men are more likely to be tired, shaky, and weak and have muscle aches and abdominal pain. A fever may mean the infection is in your kidneys. Other symptoms of a kidney infection include pain in your back or sides below the ribs, nausea, and vomiting.  DIAGNOSIS  To diagnose a UTI, your caregiver will ask you about your symptoms. Your caregiver also will ask to provide a urine sample. The urine sample will be tested for bacteria and white blood cells. White blood cells are made by your body to help fight infection.  TREATMENT   Typically, UTIs can be treated with medication. Because most UTIs are caused by a bacterial infection, they usually can be treated with the use of antibiotics. The choice of antibiotic and length of treatment depend on your symptoms and the type of bacteria causing your infection.  HOME CARE INSTRUCTIONS   If you were prescribed antibiotics, take them exactly as your caregiver instructs you. Finish the medication even if you feel better after you  have only taken some of the medication.   Drink enough water and fluids to keep your urine clear or pale yellow.   Avoid caffeine, tea, and carbonated beverages. They tend to irritate your bladder.   Empty your bladder often. Avoid holding urine for long periods of time.   Empty your bladder before and after sexual intercourse.   After a bowel movement, women should cleanse from front to back. Use each tissue only once.  SEEK MEDICAL CARE IF:    You have back pain.   You develop a fever.   Your symptoms do not begin to resolve within 3 days.  SEEK IMMEDIATE MEDICAL CARE IF:    You have severe back pain or lower abdominal pain.   You develop chills.   You have nausea or vomiting.   You have continued burning or discomfort with urination.  MAKE SURE YOU:    Understand these instructions.   Will watch your condition.   Will get help right away if you are not doing well or get worse.  Document Released: 05/29/2005 Document Revised: 02/18/2012 Document Reviewed: 09/27/2011  [employer] Patient Information 2014 [employer]

## 2013-10-11 LAB — URINE CULTURE
Colony Count: NO GROWTH
Organism ID, Bacteria: NO GROWTH

## 2013-10-13 ENCOUNTER — Telehealth: Payer: Self-pay

## 2013-10-13 NOTE — Telephone Encounter (Signed)
PATIENT WAS HERE ON Sunday SHE IS STILL HURTING PLEASE CALL [phone]

## 2013-10-14 NOTE — Telephone Encounter (Signed)
Advised pt to RTC she states she is bloated and her abd pain is worse than it was Sunday. She is going to try to get a ride today. Advised if her pain was too intense it may be better to go to the ED. Pt understands.

## 2013-10-14 NOTE — Telephone Encounter (Signed)
Attempted to call, unable to leave message. Her urine cx did not grow out anything. I advise her by mychart and phone  That she should stop cipro since she has been on a lot of antibiotics recently. IF she is feeling worse then RTC as clinical suggested

## 2013-10-15 ENCOUNTER — Encounter
Payer: PRIVATE HEALTH INSURANCE | Attending: Physical Medicine and Rehabilitation | Admitting: Physical Medicine & Rehabilitation

## 2013-10-15 ENCOUNTER — Encounter: Payer: Self-pay | Admitting: Physical Medicine & Rehabilitation

## 2013-10-15 VITALS — BP 108/65 | HR 98 | Resp 14 | Ht 64.0 in | Wt 157.0 lb

## 2013-10-15 DIAGNOSIS — M75101 Unspecified rotator cuff tear or rupture of right shoulder, not specified as traumatic: Secondary | ICD-10-CM

## 2013-10-15 DIAGNOSIS — IMO0002 Reserved for concepts with insufficient information to code with codable children: Secondary | ICD-10-CM

## 2013-10-15 DIAGNOSIS — M170 Bilateral primary osteoarthritis of knee: Secondary | ICD-10-CM

## 2013-10-15 DIAGNOSIS — M719 Bursopathy, unspecified: Principal | ICD-10-CM | POA: Insufficient documentation

## 2013-10-15 DIAGNOSIS — M2170 Unequal limb length (acquired), unspecified site: Secondary | ICD-10-CM

## 2013-10-15 DIAGNOSIS — F32A Depression, unspecified: Secondary | ICD-10-CM

## 2013-10-15 DIAGNOSIS — M1710 Unilateral primary osteoarthritis, unspecified knee: Secondary | ICD-10-CM

## 2013-10-15 DIAGNOSIS — F3289 Other specified depressive episodes: Secondary | ICD-10-CM

## 2013-10-15 DIAGNOSIS — M67919 Unspecified disorder of synovium and tendon, unspecified shoulder: Secondary | ICD-10-CM | POA: Insufficient documentation

## 2013-10-15 DIAGNOSIS — F329 Major depressive disorder, single episode, unspecified: Secondary | ICD-10-CM | POA: Insufficient documentation

## 2013-10-15 DIAGNOSIS — M961 Postlaminectomy syndrome, not elsewhere classified: Secondary | ICD-10-CM

## 2013-10-15 DIAGNOSIS — G2581 Restless legs syndrome: Secondary | ICD-10-CM

## 2013-10-15 MED ORDER — FENTANYL 50 MCG/HR TD PT72: 50.0000 ug | MEDICATED_PATCH | TRANSDERMAL | Status: DC

## 2013-10-15 MED ORDER — HYDROCODONE-ACETAMINOPHEN 5-325 MG PO TABS: 1.0000 | ORAL_TABLET | Freq: Four times a day (QID) | ORAL | Status: DC | PRN

## 2013-10-15 NOTE — Progress Notes (Signed)
Subjective:    Patient ID: Jill Phillips, female    DOB: 15-Oct-1955, 58 y.o.   MRN: [id number]  HPI  Jill Phillips is back regarding her chronic pain. Her RLS is better with the neurontin. Her sleep is poor due to urinary frequency at night. Jill Phillips is taking HCTZ typically at NIGHT time. Her bp's have been good to borderline low. Jill Phillips had to reschedule her sleep study due to some infections over the last several weeks.  Jill Phillips remains on her pain medications including the 6mcg fentanyl patch and the hydrocodone for breakthrough . The klonopin helps her fall asleep.  Jill Phillips is still smoking regularly. Jill Phillips didn't start the chantix as Jill Phillips was concerned about potential side effects  Pain Inventory Average Pain 4 Pain Right Now 5 My pain is aching and spasms  In the last 24 hours, has pain interfered with the following? General activity 3 Relation with others 0 Enjoyment of life 3 What TIME of day is your pain at its worst? morning, evening Sleep (in general) Poor  Pain is worse with: walking, standing and some activites Pain improves with: rest and medication Relief from Meds: 7  Mobility walk without assistance transfers alone Do you have any goals in this area?  no  Function Do you have any goals in this area?  no  Neuro/Psych bladder control problems  Prior Studies Any changes since last visit?  no  Physicians involved in your care Any changes since last visit?  no   Family History  Problem Relation Age of Onset  . Kidney disease Mother   . Heart disease Father   . Anuerysm Brother 29    brain  . Heart disease Brother   . Heart disease Sister 26    s/p CABG  . Hypertension Sister    History   Social History  . Marital Status: Divorced    Spouse Name: n/a    Number of Children: 2  . Years of Education: 12+   Occupational History  . disability     back surgeries   Social History Main Topics  . Smoking status: Current Every Day Smoker -- 1.50 packs/day for 45  years    Types: Cigarettes  . Smokeless tobacco: Never Used  . Alcohol Use: No  . Drug Use: No  . Sexual Activity: Not Currently    Partners: Male   Other Topics Concern  . None   Social History Narrative   Lives alone.  One daughter is local, but is getting ready to move to [place], where her children live with their father.  The other daughter lives near [place], [place].   Past Surgical History  Procedure Laterality Date  . Appendectomy    . Abdominal hysterectomy    . Tubal ligation    . Spine surgery      thoracic x 1,  lumbar x 15  . Right hip replacement    . Knee surgeries r knee      arthroscopy- right  . Hammer toe surgery    . Sleep apnea surgery    . Cholecystectomy    . Back surgery    . Joint replacement    . Total knee arthroplasty  05/29/2012    Procedure: TOTAL KNEE ARTHROPLASTY;  Surgeon: Mcarthur Rossetti, MD;  Location: [facility];  Service: Orthopedics;  Laterality: Right;  Right Total Knee Arthroplasty   Past Medical History  Diagnosis Date  . Postlaminectomy syndrome, thoracic region   . Osteoarthrosis, unspecified whether  generalized or localized, lower leg   . Dysthymic disorder   . Calcifying tendinitis of shoulder   . Pain in joint, upper arm   . Chronic pain syndrome   . Lumbago   . Primary localized osteoarthrosis, lower leg   . Hypertension   . Hyperlipidemia   . GERD (gastroesophageal reflux disease)   . Thyroid disease   . Restless leg syndrome   . PONV (postoperative nausea and vomiting)   . Heart murmur   . Sleep apnea     s/p surgery- last sleep study 2011- doesnt use oxygen or machine at night as instructed  . History of blood transfusion 1980  . Hypothyroidism   . Active smoker    BP 108/65  Pulse 98  Resp 14  Ht 5\' 4"  (1.626 m)  Wt 157 lb (71.215 kg)  BMI 26.94 kg/m2  SpO2 94%  Opioid Risk Score:   Fall Risk Score: Moderate Fall Risk (6-13 points) (pt educated on fall risk, declined brochure.)   Review of Systems    Genitourinary:       Bladder control problems  Musculoskeletal: Positive for back pain.  All other systems reviewed and are negative.       Objective:   Physical Exam Constitutional: Jill Phillips is oriented to person, place, and time. Jill Phillips appears well-developed and well-nourished.  HENT:  Head: Normocephalic.  Eyes: EOM are normal. Pupils are equal, round, and reactive to light.  Neck: Normal range of motion.  Cardiovascular: Normal rate.  Pulmonary/Chest: Effort normal.  Abdominal: Soft.  Musculoskeletal:  Right knee with minimal tenderness. Post-op scars noted. Knee rom 120+ with flexion. Weight bearing better with minimal antalgia seen.  Right shoulder with only mild tenderness with ROM. No shoulder instability noted. Had full AROM. Low back/pelvic with hemi-elevation of the right pelvis and tightness of the lumbar paraspinals. Jill Phillips is limited with all plains of ROM. Generalized tenderness of c-spine with some limitation in ROM present Neurological: Jill Phillips is alert and oriented to person, place, and time.  Skin: Skin is warm.  Psychiatric: Jill Phillips has a normal mood and affect. Her behavior is normal. Judgment and thought content normal.   Assessment & Plan:   ASSESSMENT:  1. Chronic lumbar spine pain/post-lami syndrome  2. Osteoarthritis of the knees bilaterally, right greater than left.  3. Rotator cuff syndrome/subacromial bursitis.  4. Restless legs syndrome.  5. O2 dependent sleep apnea.  6. Tobacco abuse.  7. Right leg length discrepancy.    PLAN:  1. Follow up sleep study as possible. Will continue fentanyl to 50 mcg every 72 hours. Continue with hydrocodone for breakthrough pain.  2. Continue with 4mg  klonopin at night as well as gabapentin 300mg  bid.  3. Stop HCTZ as her bp is good and her urinary frequency has been problematic 4. Discussed the importance of regular exercise, stretching, and posture. Jill Phillips may use her soft corset occasionally if Jill Phillips fatigues. i provided  substantial back exercises today.  4. Follow up with my RN/PA in one month. 30 minutes of face to face patient care time were spent during this visit. All questions were encouraged and answered.

## 2013-10-15 NOTE — Patient Instructions (Addendum)
STOP HYDROCHLOROTHIAZIDE  PLEASE SCHEDULE YOUR SLEEP STUDY  Low Back Strain with Rehab A strain is an injury in which a tendon or muscle is torn. The muscles and tendons of the lower back are vulnerable to strains. However, these muscles and tendons are very strong and require a great force to be injured. Strains are classified into three categories. Grade 1 strains cause pain, but the tendon is not lengthened. Grade 2 strains include a lengthened ligament, due to the ligament being stretched or partially ruptured. With grade 2 strains there is still function, although the function may be decreased. Grade 3 strains involve a complete tear of the tendon or muscle, and function is usually impaired. SYMPTOMS   Pain in the lower back.  Pain that affects one side more than the other.  Pain that gets worse with movement and may be felt in the hip, buttocks, or back of the thigh.  Muscle spasms of the muscles in the back.  Swelling along the muscles of the back.  Loss of strength of the back muscles.  Crackling sound (crepitation) when the muscles are touched. CAUSES  Lower back strains occur when a force is placed on the muscles or tendons that is greater than they can handle. Common causes of injury include:  Prolonged overuse of the muscle-tendon units in the lower back, usually from incorrect posture.  A single violent injury or force applied to the back. RISK INCREASES WITH:  Sports that involve twisting forces on the spine or a lot of bending at the waist (football, rugby, weightlifting, bowling, golf, tennis, speed skating, racquetball, swimming, running, gymnastics, diving).  Poor strength and flexibility.  Failure to warm up properly before activity.  Family history of lower back pain or disk disorders.  Previous back injury or surgery (especially fusion).  Poor posture with lifting, especially heavy objects.  Prolonged sitting, especially with poor posture. PREVENTION    Learn and use proper posture when sitting or lifting (maintain proper posture when sitting, lift using the knees and legs, not at the waist).  Warm up and stretch properly before activity.  Allow for adequate recovery between workouts.  Maintain physical fitness:  Strength, flexibility, and endurance.  Cardiovascular fitness. PROGNOSIS  If treated properly, lower back strains usually heal within 6 weeks. RELATED COMPLICATIONS   Recurring symptoms, resulting in a chronic problem.  Chronic inflammation, scarring, and partial muscle-tendon tear.  Delayed healing or resolution of symptoms.  Prolonged disability. TREATMENT  Treatment first involves the use of ice and medicine, to reduce pain and inflammation. The use of strengthening and stretching exercises may help reduce pain with activity. These exercises may be performed at home or with a therapist. Severe injuries may require referral to a therapist for further evaluation and treatment, such as ultrasound. Your caregiver may advise that you wear a back brace or corset, to help reduce pain and discomfort. Often, prolonged bed rest results in greater harm then benefit. Corticosteroid injections may be recommended. However, these should be reserved for the most serious cases. It is important to avoid using your back when lifting objects. At night, sleep on your back on a firm mattress with a pillow placed under your knees. If non-surgical treatment is unsuccessful, surgery may be needed.  MEDICATION   If pain medicine is needed, nonsteroidal anti-inflammatory medicines (aspirin and ibuprofen), or other minor pain relievers (acetaminophen), are often advised.  Do not take pain medicine for 7 days before surgery.  Prescription pain relievers may be given, if  your caregiver thinks they are needed. Use only as directed and only as much as you need.  Ointments applied to the skin may be helpful.  Corticosteroid injections may be given  by your caregiver. These injections should be reserved for the most serious cases, because they may only be given a certain number of times. HEAT AND COLD  Cold treatment (icing) should be applied for 10 to 15 minutes every 2 to 3 hours for inflammation and pain, and immediately after activity that aggravates your symptoms. Use ice packs or an ice massage.  Heat treatment may be used before performing stretching and strengthening activities prescribed by your caregiver, physical therapist, or athletic trainer. Use a heat pack or a warm water soak. SEEK MEDICAL CARE IF:   Symptoms get worse or do not improve in 2 to 4 weeks, despite treatment.  You develop numbness, weakness, or loss of bowel or bladder function.  New, unexplained symptoms develop. (Drugs used in treatment may produce side effects.) EXERCISES  RANGE OF MOTION (ROM) AND STRETCHING EXERCISES - Low Back Strain Most people with lower back pain will find that their symptoms get worse with excessive bending forward (flexion) or arching at the lower back (extension). The exercises which will help resolve your symptoms will focus on the opposite motion.  Your physician, physical therapist or athletic trainer will help you determine which exercises will be most helpful to resolve your lower back pain. Do not complete any exercises without first consulting with your caregiver. Discontinue any exercises which make your symptoms worse until you speak to your caregiver.  If you have pain, numbness or tingling which travels down into your buttocks, leg or foot, the goal of the therapy is for these symptoms to move closer to your back and eventually resolve. Sometimes, these leg symptoms will get better, but your lower back pain may worsen. This is typically an indication of progress in your rehabilitation. Be very alert to any changes in your symptoms and the activities in which you participated in the 24 hours prior to the change. Sharing this  information with your caregiver will allow him/her to most efficiently treat your condition.  These exercises may help you when beginning to rehabilitate your injury. Your symptoms may resolve with or without further involvement from your physician, physical therapist or athletic trainer. While completing these exercises, remember:  Restoring tissue flexibility helps normal motion to return to the joints. This allows healthier, less painful movement and activity.  An effective stretch should be held for at least 30 seconds.  A stretch should never be painful. You should only feel a gentle lengthening or release in the stretched tissue. FLEXION RANGE OF MOTION AND STRETCHING EXERCISES: STRETCH  Flexion, Single Knee to Chest   Lie on a firm bed or floor with both legs extended in front of you.  Keeping one leg in contact with the floor, bring your opposite knee to your chest. Hold your leg in place by either grabbing behind your thigh or at your knee.  Pull until you feel a gentle stretch in your lower back. Hold __________ seconds.  Slowly release your grasp and repeat the exercise with the opposite side. Repeat __________ times. Complete this exercise __________ times per day.  STRETCH  Flexion, Double Knee to Chest   Lie on a firm bed or floor with both legs extended in front of you.  Keeping one leg in contact with the floor, bring your opposite knee to your chest.  Tense  your stomach muscles to support your back and then lift your other knee to your chest. Hold your legs in place by either grabbing behind your thighs or at your knees.  Pull both knees toward your chest until you feel a gentle stretch in your lower back. Hold __________ seconds.  Tense your stomach muscles and slowly return one leg at a time to the floor. Repeat __________ times. Complete this exercise __________ times per day.  STRETCH  Low Trunk Rotation  Lie on a firm bed or floor. Keeping your legs in front of  you, bend your knees so they are both pointed toward the ceiling and your feet are flat on the floor.  Extend your arms out to the side. This will stabilize your upper body by keeping your shoulders in contact with the floor.  Gently and slowly drop both knees together to one side until you feel a gentle stretch in your lower back. Hold for __________ seconds.  Tense your stomach muscles to support your lower back as you bring your knees back to the starting position. Repeat the exercise to the other side. Repeat __________ times. Complete this exercise __________ times per day  EXTENSION RANGE OF MOTION AND FLEXIBILITY EXERCISES: STRETCH  Extension, Prone on Elbows   Lie on your stomach on the floor, a bed will be too soft. Place your palms about shoulder width apart and at the height of your head.  Place your elbows under your shoulders. If this is too painful, stack pillows under your chest.  Allow your body to relax so that your hips drop lower and make contact more completely with the floor.  Hold this position for __________ seconds.  Slowly return to lying flat on the floor. Repeat __________ times. Complete this exercise __________ times per day.  RANGE OF MOTION  Extension, Prone Press Ups  Lie on your stomach on the floor, a bed will be too soft. Place your palms about shoulder width apart and at the height of your head.  Keeping your back as relaxed as possible, slowly straighten your elbows while keeping your hips on the floor. You may adjust the placement of your hands to maximize your comfort. As you gain motion, your hands will come more underneath your shoulders.  Hold this position __________ seconds.  Slowly return to lying flat on the floor. Repeat __________ times. Complete this exercise __________ times per day.  RANGE OF MOTION- Quadruped, Neutral Spine   Assume a hands and knees position on a firm surface. Keep your hands under your shoulders and your knees  under your hips. You may place padding under your knees for comfort.  Drop your head and point your tail bone toward the ground below you. This will round out your lower back like an angry cat. Hold this position for __________ seconds.  Slowly lift your head and release your tail bone so that your back sags into a large arch, like an old horse.  Hold this position for __________ seconds.  Repeat this until you feel limber in your lower back.  Now, find your "sweet spot." This will be the most comfortable position somewhere between the two previous positions. This is your neutral spine. Once you have found this position, tense your stomach muscles to support your lower back.  Hold this position for __________ seconds. Repeat __________ times. Complete this exercise __________ times per day.  STRENGTHENING EXERCISES - Low Back Strain These exercises may help you when beginning to rehabilitate your  injury. These exercises should be done near your "sweet spot." This is the neutral, low-back arch, somewhere between fully rounded and fully arched, that is your least painful position. When performed in this safe range of motion, these exercises can be used for people who have either a flexion or extension based injury. These exercises may resolve your symptoms with or without further involvement from your physician, physical therapist or athletic trainer. While completing these exercises, remember:   Muscles can gain both the endurance and the strength needed for everyday activities through controlled exercises.  Complete these exercises as instructed by your physician, physical therapist or athletic trainer. Increase the resistance and repetitions only as guided.  You may experience muscle soreness or fatigue, but the pain or discomfort you are trying to eliminate should never worsen during these exercises. If this pain does worsen, stop and make certain you are following the directions exactly. If  the pain is still present after adjustments, discontinue the exercise until you can discuss the trouble with your caregiver. STRENGTHENING Deep Abdominals, Pelvic Tilt  Lie on a firm bed or floor. Keeping your legs in front of you, bend your knees so they are both pointed toward the ceiling and your feet are flat on the floor.  Tense your lower abdominal muscles to press your lower back into the floor. This motion will rotate your pelvis so that your tail bone is scooping upwards rather than pointing at your feet or into the floor.  With a gentle tension and even breathing, hold this position for __________ seconds. Repeat __________ times. Complete this exercise __________ times per day.  STRENGTHENING  Abdominals, Crunches   Lie on a firm bed or floor. Keeping your legs in front of you, bend your knees so they are both pointed toward the ceiling and your feet are flat on the floor. Cross your arms over your chest.  Slightly tip your chin down without bending your neck.  Tense your abdominals and slowly lift your trunk high enough to just clear your shoulder blades. Lifting higher can put excessive stress on the lower back and does not further strengthen your abdominal muscles.  Control your return to the starting position. Repeat __________ times. Complete this exercise __________ times per day.  STRENGTHENING  Quadruped, Opposite UE/LE Lift   Assume a hands and knees position on a firm surface. Keep your hands under your shoulders and your knees under your hips. You may place padding under your knees for comfort.  Find your neutral spine and gently tense your abdominal muscles so that you can maintain this position. Your shoulders and hips should form a rectangle that is parallel with the floor and is not twisted.  Keeping your trunk steady, lift your right hand no higher than your shoulder and then your left leg no higher than your hip. Make sure you are not holding your breath. Hold  this position __________ seconds.  Continuing to keep your abdominal muscles tense and your back steady, slowly return to your starting position. Repeat with the opposite arm and leg. Repeat __________ times. Complete this exercise __________ times per day.  STRENGTHENING  Lower Abdominals, Double Knee Lift  Lie on a firm bed or floor. Keeping your legs in front of you, bend your knees so they are both pointed toward the ceiling and your feet are flat on the floor.  Tense your abdominal muscles to brace your lower back and slowly lift both of your knees until they come over your  hips. Be certain not to hold your breath.  Hold __________ seconds. Using your abdominal muscles, return to the starting position in a slow and controlled manner. Repeat __________ times. Complete this exercise __________ times per day.  POSTURE AND BODY MECHANICS CONSIDERATIONS - Low Back Strain Keeping correct posture when sitting, standing or completing your activities will reduce the stress put on different body tissues, allowing injured tissues a chance to heal and limiting painful experiences. The following are general guidelines for improved posture. Your physician or physical therapist will provide you with any instructions specific to your needs. While reading these guidelines, remember:  The exercises prescribed by your provider will help you have the flexibility and strength to maintain correct postures.  The correct posture provides the best environment for your joints to work. All of your joints have less wear and tear when properly supported by a spine with good posture. This means you will experience a healthier, less painful body.  Correct posture must be practiced with all of your activities, especially prolonged sitting and standing. Correct posture is as important when doing repetitive low-stress activities (typing) as it is when doing a single heavy-load activity (lifting). RESTING POSITIONS Consider  which positions are most painful for you when choosing a resting position. If you have pain with flexion-based activities (sitting, bending, stooping, squatting), choose a position that allows you to rest in a less flexed posture. You would want to avoid curling into a fetal position on your side. If your pain worsens with extension-based activities (prolonged standing, working overhead), avoid resting in an extended position such as sleeping on your stomach. Most people will find more comfort when they rest with their spine in a more neutral position, neither too rounded nor too arched. Lying on a non-sagging bed on your side with a pillow between your knees, or on your back with a pillow under your knees will often provide some relief. Keep in mind, being in any one position for a prolonged period of time, no matter how correct your posture, can still lead to stiffness. PROPER SITTING POSTURE In order to minimize stress and discomfort on your spine, you must sit with correct posture. Sitting with good posture should be effortless for a healthy body. Returning to good posture is a gradual process. Many people can work toward this most comfortably by using various supports until they have the flexibility and strength to maintain this posture on their own. When sitting with proper posture, your ears will fall over your shoulders and your shoulders will fall over your hips. You should use the back of the chair to support your upper back. Your lower back will be in a neutral position, just slightly arched. You may place a small pillow or folded towel at the base of your lower back for support.  When working at a desk, create an environment that supports good, upright posture. Without extra support, muscles tire, which leads to excessive strain on joints and other tissues. Keep these recommendations in mind: CHAIR:  A chair should be able to slide under your desk when your back makes contact with the back of the  chair. This allows you to work closely.  The chair's height should allow your eyes to be level with the upper part of your monitor and your hands to be slightly lower than your elbows. BODY POSITION  Your feet should make contact with the floor. If this is not possible, use a foot rest.  Keep your ears over your  shoulders. This will reduce stress on your neck and lower back. INCORRECT SITTING POSTURES  If you are feeling tired and unable to assume a healthy sitting posture, do not slouch or slump. This puts excessive strain on your back tissues, causing more damage and pain. Healthier options include:  Using more support, like a lumbar pillow.  Switching tasks to something that requires you to be upright or walking.  Talking a brief walk.  Lying down to rest in a neutral-spine position. PROLONGED STANDING WHILE SLIGHTLY LEANING FORWARD  When completing a task that requires you to lean forward while standing in one place for a long time, place either foot up on a stationary 2-4 inch high object to help maintain the best posture. When both feet are on the ground, the lower back tends to lose its slight inward curve. If this curve flattens (or becomes too large), then the back and your other joints will experience too much stress, tire more quickly, and can cause pain. CORRECT STANDING POSTURES Proper standing posture should be assumed with all daily activities, even if they only take a few moments, like when brushing your teeth. As in sitting, your ears should fall over your shoulders and your shoulders should fall over your hips. You should keep a slight tension in your abdominal muscles to brace your spine. Your tailbone should point down to the ground, not behind your body, resulting in an over-extended swayback posture.  INCORRECT STANDING POSTURES  Common incorrect standing postures include a forward head, locked knees and/or an excessive swayback. WALKING Walk with an upright posture.  Your ears, shoulders and hips should all line-up. PROLONGED ACTIVITY IN A FLEXED POSITION When completing a task that requires you to bend forward at your waist or lean over a low surface, try to find a way to stabilize 3 out of 4 of your limbs. You can place a hand or elbow on your thigh or rest a knee on the surface you are reaching across. This will provide you more stability so that your muscles do not fatigue as quickly. By keeping your knees relaxed, or slightly bent, you will also reduce stress across your lower back. CORRECT LIFTING TECHNIQUES DO :   Assume a wide stance. This will provide you more stability and the opportunity to get as close as possible to the object which you are lifting.  Tense your abdominals to brace your spine. Bend at the knees and hips. Keeping your back locked in a neutral-spine position, lift using your leg muscles. Lift with your legs, keeping your back straight.  Test the weight of unknown objects before attempting to lift them.  Try to keep your elbows locked down at your sides in order get the best strength from your shoulders when carrying an object.  Always ask for help when lifting heavy or awkward objects. INCORRECT LIFTING TECHNIQUES DO NOT:   Lock your knees when lifting, even if it is a small object.  Bend and twist. Pivot at your feet or move your feet when needing to change directions.  Assume that you can safely pick up even a paper clip without proper posture. Document Released: 08/19/2005 Document Revised: 11/11/2011 Document Reviewed: 12/01/2008 [facility] Patient Information 2014 [place], [place].

## 2013-10-22 ENCOUNTER — Encounter: Payer: PRIVATE HEALTH INSURANCE | Admitting: Internal Medicine

## 2013-10-25 ENCOUNTER — Emergency Department (HOSPITAL_COMMUNITY)
Admission: EM | Admit: 2013-10-25 | Discharge: 2013-10-25 | Disposition: A | Discharge status: Home / Self Care | Payer: PRIVATE HEALTH INSURANCE | Attending: Emergency Medicine | Admitting: Emergency Medicine

## 2013-10-25 ENCOUNTER — Encounter (HOSPITAL_COMMUNITY): Payer: Self-pay | Admitting: Emergency Medicine

## 2013-10-25 ENCOUNTER — Emergency Department (HOSPITAL_COMMUNITY): Payer: PRIVATE HEALTH INSURANCE

## 2013-10-25 DIAGNOSIS — Z791 Long term (current) use of non-steroidal anti-inflammatories (NSAID): Secondary | ICD-10-CM | POA: Insufficient documentation

## 2013-10-25 DIAGNOSIS — E039 Hypothyroidism, unspecified: Secondary | ICD-10-CM | POA: Insufficient documentation

## 2013-10-25 DIAGNOSIS — Z9851 Tubal ligation status: Secondary | ICD-10-CM | POA: Insufficient documentation

## 2013-10-25 DIAGNOSIS — Z9889 Other specified postprocedural states: Secondary | ICD-10-CM | POA: Insufficient documentation

## 2013-10-25 DIAGNOSIS — Z79899 Other long term (current) drug therapy: Secondary | ICD-10-CM | POA: Insufficient documentation

## 2013-10-25 DIAGNOSIS — Z9089 Acquired absence of other organs: Secondary | ICD-10-CM | POA: Insufficient documentation

## 2013-10-25 DIAGNOSIS — G894 Chronic pain syndrome: Secondary | ICD-10-CM | POA: Insufficient documentation

## 2013-10-25 DIAGNOSIS — F17200 Nicotine dependence, unspecified, uncomplicated: Secondary | ICD-10-CM | POA: Insufficient documentation

## 2013-10-25 DIAGNOSIS — F341 Dysthymic disorder: Secondary | ICD-10-CM | POA: Insufficient documentation

## 2013-10-25 DIAGNOSIS — K219 Gastro-esophageal reflux disease without esophagitis: Secondary | ICD-10-CM | POA: Insufficient documentation

## 2013-10-25 DIAGNOSIS — K5900 Constipation, unspecified: Secondary | ICD-10-CM | POA: Insufficient documentation

## 2013-10-25 DIAGNOSIS — E785 Hyperlipidemia, unspecified: Secondary | ICD-10-CM | POA: Insufficient documentation

## 2013-10-25 DIAGNOSIS — I10 Essential (primary) hypertension: Secondary | ICD-10-CM | POA: Insufficient documentation

## 2013-10-25 DIAGNOSIS — R011 Cardiac murmur, unspecified: Secondary | ICD-10-CM | POA: Insufficient documentation

## 2013-10-25 DIAGNOSIS — M1710 Unilateral primary osteoarthritis, unspecified knee: Secondary | ICD-10-CM | POA: Insufficient documentation

## 2013-10-25 DIAGNOSIS — R109 Unspecified abdominal pain: Secondary | ICD-10-CM

## 2013-10-25 DIAGNOSIS — Z90710 Acquired absence of both cervix and uterus: Secondary | ICD-10-CM | POA: Insufficient documentation

## 2013-10-25 DIAGNOSIS — G2581 Restless legs syndrome: Secondary | ICD-10-CM | POA: Insufficient documentation

## 2013-10-25 DIAGNOSIS — Z3202 Encounter for pregnancy test, result negative: Secondary | ICD-10-CM | POA: Insufficient documentation

## 2013-10-25 LAB — COMPREHENSIVE METABOLIC PANEL
ALT: 20 U/L (ref 0–35)
AST: 28 U/L (ref 0–37)
Albumin: 3.7 g/dL (ref 3.5–5.2)
Alkaline Phosphatase: 100 U/L (ref 39–117)
BUN: 18 mg/dL (ref 6–23)
CO2: 29 mEq/L (ref 19–32)
Calcium: 9.8 mg/dL (ref 8.4–10.5)
Chloride: 103 mEq/L (ref 96–112)
Creatinine, Ser: 0.87 mg/dL (ref 0.50–1.10)
GFR calc Af Amer: 83 mL/min — ABNORMAL LOW (ref >90)
GFR calc non Af Amer: 72 mL/min — ABNORMAL LOW (ref >90)
Glucose, Bld: 135 mg/dL — ABNORMAL HIGH (ref 70–99)
Potassium: 4.2 mEq/L (ref 3.7–5.3)
Sodium: 144 mEq/L (ref 137–147)
Total Bilirubin: <0.2 mg/dL — ABNORMAL LOW (ref 0.3–1.2)
Total Protein: 6.4 g/dL (ref 6.0–8.3)

## 2013-10-25 LAB — PREGNANCY, URINE: Preg Test, Ur: NEGATIVE

## 2013-10-25 LAB — URINALYSIS, ROUTINE W REFLEX MICROSCOPIC
Bilirubin Urine: NEGATIVE
Glucose, UA: NEGATIVE mg/dL
Hgb urine dipstick: NEGATIVE
Ketones, ur: NEGATIVE mg/dL
Nitrite: NEGATIVE
Protein, ur: NEGATIVE mg/dL
Specific Gravity, Urine: 1.019 (ref 1.005–1.030)
Urobilinogen, UA: 0.2 mg/dL (ref 0.0–1.0)
pH: 7.5 (ref 5.0–8.0)

## 2013-10-25 LAB — TROPONIN I: Troponin I: <0.3 ng/mL (ref <0.30)

## 2013-10-25 LAB — CBC WITH DIFFERENTIAL/PLATELET
Basophils Absolute: 0 10*3/uL (ref 0.0–0.1)
Basophils Relative: 0 % (ref 0–1)
Eosinophils Absolute: 0.2 10*3/uL (ref 0.0–0.7)
Eosinophils Relative: 2 % (ref 0–5)
HCT: 33.7 % — ABNORMAL LOW (ref 36.0–46.0)
Hemoglobin: 11.2 g/dL — ABNORMAL LOW (ref 12.0–15.0)
Lymphocytes Relative: 37 % (ref 12–46)
Lymphs Abs: 2.4 10*3/uL (ref 0.7–4.0)
MCH: 31.3 pg (ref 26.0–34.0)
MCHC: 33.2 g/dL (ref 30.0–36.0)
MCV: 94.1 fL (ref 78.0–100.0)
Monocytes Absolute: 0.4 10*3/uL (ref 0.1–1.0)
Monocytes Relative: 6 % (ref 3–12)
Neutro Abs: 3.5 10*3/uL (ref 1.7–7.7)
Neutrophils Relative %: 54 % (ref 43–77)
Platelets: 233 10*3/uL (ref 150–400)
RBC: 3.58 MIL/uL — ABNORMAL LOW (ref 3.87–5.11)
RDW: 13.1 % (ref 11.5–15.5)
WBC: 6.4 10*3/uL (ref 4.0–10.5)

## 2013-10-25 LAB — URINE MICROSCOPIC-ADD ON

## 2013-10-25 LAB — LIPASE, BLOOD: Lipase: 34 U/L (ref 11–59)

## 2013-10-25 MED ORDER — MINERAL OIL RE ENEM: 1.0000 | ENEMA | Freq: Once | RECTAL | Status: DC

## 2013-10-25 MED ORDER — DOCUSATE SODIUM 100 MG PO CAPS: 100.0000 mg | ORAL_CAPSULE | Freq: Every day | ORAL | Status: DC | PRN

## 2013-10-25 MED ORDER — HYDROMORPHONE HCL PF 2 MG/ML IJ SOLN
2.0000 mg | Freq: Once | INTRAMUSCULAR | Status: AC
  Administered 2013-10-25: 2 mg via INTRAMUSCULAR
  Filled 2013-10-25: qty 1

## 2013-10-25 MED ORDER — POLYETHYLENE GLYCOL 3350 17 GM/SCOOP PO POWD: 17.0000 g | Freq: Two times a day (BID) | ORAL | Status: DC

## 2013-10-25 MED ORDER — DIPHENHYDRAMINE HCL 50 MG/ML IJ SOLN
25.0000 mg | Freq: Once | INTRAMUSCULAR | Status: AC
  Administered 2013-10-25: 25 mg via INTRAMUSCULAR
  Filled 2013-10-25: qty 1

## 2013-10-25 NOTE — ED Provider Notes (Signed)
CSN: [id number]     Arrival date & time 10/25/13  [id number] History   First MD Initiated Contact with Patient 10/25/13 0825     Chief Complaint  Patient presents with  . Abdominal Pain     (Consider location/radiation/quality/duration/timing/severity/associated sxs/prior Treatment) HPI Comments: Patient is a 58 year old female past medical history significant for chronic pain syndrome, HTN, HLD, GERD, hypothyroidism, OSA, tobacco use, dysthymic disorder, restless leg syndrome presented to emergency department for 6 days of epigastric pain without radiation. Patient is unable to qualify her pain. She states it is currently a 6/10. She denies any aggravating factors. She states her home pain medications to alleviate her pain. Patient does endorse some increased constipation from baseline over the last two days. She has tried taking a Colace with only a small BM. She denies any associated nausea, vomiting, urinary symptoms, vaginal symptoms, diarrhea, chest pain, shortness of breath, fever or chills. Patient's abdominal surgical history includes appendectomy, abdominal hysterectomy, tubal ligation, cholecystectomy. Patient states she has had an echocardiogram several years ago.   Patient is a 58 y.o. female presenting with abdominal pain.  Abdominal Pain Associated symptoms: constipation (baseline)   Associated symptoms: no chest pain, no chills, no diarrhea, no fever, no nausea, no shortness of breath and no vomiting     Past Medical History  Diagnosis Date  . Postlaminectomy syndrome, thoracic region   . Osteoarthrosis, unspecified whether generalized or localized, lower leg   . Dysthymic disorder   . Calcifying tendinitis of shoulder   . Pain in joint, upper arm   . Chronic pain syndrome   . Lumbago   . Primary localized osteoarthrosis, lower leg   . Hypertension   . Hyperlipidemia   . GERD (gastroesophageal reflux disease)   . Thyroid disease   . Restless leg syndrome   . PONV  (postoperative nausea and vomiting)   . Heart murmur   . Sleep apnea     s/p surgery- last sleep study 2011- doesnt use oxygen or machine at night as instructed  . History of blood transfusion 1980  . Hypothyroidism   . Active smoker    Past Surgical History  Procedure Laterality Date  . Appendectomy    . Abdominal hysterectomy    . Tubal ligation    . Spine surgery      thoracic x 1,  lumbar x 15  . Right hip replacement    . Knee surgeries r knee      arthroscopy- right  . Hammer toe surgery    . Sleep apnea surgery    . Cholecystectomy    . Back surgery    . Joint replacement    . Total knee arthroplasty  05/29/2012    Procedure: TOTAL KNEE ARTHROPLASTY;  Surgeon: Mcarthur Rossetti, MD;  Location: [facility];  Service: Orthopedics;  Laterality: Right;  Right Total Knee Arthroplasty   Family History  Problem Relation Age of Onset  . Kidney disease Mother   . Heart disease Father   . Anuerysm Brother 29    brain  . Heart disease Brother   . Heart disease Sister 68    s/p CABG  . Hypertension Sister    History  Substance Use Topics  . Smoking status: Current Every Day Smoker -- 1.50 packs/day for 45 years    Types: Cigarettes  . Smokeless tobacco: Never Used  . Alcohol Use: No   OB History   Grav Para Term Preterm Abortions TAB SAB Ect Mult Living  Review of Systems  Constitutional: Negative for fever and chills.  Respiratory: Negative for shortness of breath.   Cardiovascular: Negative for chest pain.  Gastrointestinal: Positive for abdominal pain and constipation (baseline). Negative for nausea, vomiting and diarrhea.  All other systems reviewed and are negative.      Allergies  Amoxicillin; Chlorzoxazone; Codeine; Darvocet; Dilaudid; Flagyl; Keflex; Morphine and related; Nitrofurantoin monohyd macro; Oxycodone-acetaminophen; Percocet; and Sulfa antibiotics  Home Medications   Current Outpatient Rx  Name  Route  Sig  Dispense  Refill   . acetaminophen (TYLENOL) 500 MG tablet   Oral   Take 1,000 mg by mouth every 6 (six) hours as needed. For pain         . ALPRAZolam (XANAX) 1 MG tablet   Oral   Take 1 tablet by mouth daily as needed for anxiety.          [name] amLODipine (NORVASC) 5 MG tablet   Oral   Take 5 mg by mouth daily.         . benazepril (LOTENSIN) 20 MG tablet   Oral   Take 20 mg by mouth daily.         . carisoprodol (SOMA) 350 MG tablet   Oral   Take 350 mg by mouth every 8 (eight) hours as needed for muscle spasms.         . clonazePAM (KLONOPIN) 2 MG tablet   Oral   Take 4 mg by mouth at bedtime.         . cloNIDine (CATAPRES) 0.1 MG tablet   Oral   Take 0.1 mg by mouth 3 (three) times daily.         . diclofenac sodium (VOLTAREN) 1 % GEL   Topical   Apply 2 g topically 3 (three) times daily.         [name] ESZOPICLONE 3 MG tablet   Oral   Take 3 mg by mouth at bedtime.          . fentaNYL (DURAGESIC - DOSED MCG/HR) 50 MCG/HR   Transdermal   Place 50 mcg onto the skin every 3 (three) days.         [name] gabapentin (NEURONTIN) 300 MG capsule   Oral   Take 300 mg by mouth 2 (two) times daily.         [name] HYDROcodone-acetaminophen (NORCO/VICODIN) 5-325 MG per tablet   Oral   Take 1 tablet by mouth every 6 (six) hours as needed for moderate pain.         [name] levothyroxine (SYNTHROID, LEVOTHROID) 25 MCG tablet   Oral   Take 25 mcg by mouth daily before breakfast.         . naproxen (NAPROSYN) 500 MG tablet   Oral   Take 750 mg by mouth at bedtime as needed for mild pain or moderate pain (for pain).          [name] omeprazole (PRILOSEC) 20 MG capsule   Oral   Take 20 mg by mouth daily.         . pravastatin (PRAVACHOL) 20 MG tablet   Oral   Take 20 mg by mouth daily.         . valACYclovir (VALTREX) 500 MG tablet   Oral   Take 500 mg by mouth daily.         [name] venlafaxine (EFFEXOR-XR) 150 MG 24 hr capsule   Oral   Take 300 mg by mouth at bedtime.           [name]  docusate sodium (COLACE) 100 MG capsule   Oral   Take 1 capsule (100 mg total) by mouth daily as needed for mild constipation.   10 capsule   0   . mineral oil enema   Rectal   Place 1 enema rectally once.   133 mL   0   . polyethylene glycol powder (GLYCOLAX/MIRALAX) powder   Oral   Take 17 g by mouth 2 (two) times daily. Until daily soft stools  OTC   255 g   0    BP 133/83  Pulse 86  Temp(Src) 98.4 F (36.9 C) (Oral)  Resp 18  SpO2 100% Physical Exam  Constitutional: She is oriented to person, place, and time. She appears well-developed and well-nourished. No distress.  HENT:  Head: Normocephalic and atraumatic.  Right Ear: External ear normal.  Left Ear: External ear normal.  Nose: Nose normal.  Mouth/Throat: Oropharynx is clear and moist. No oropharyngeal exudate.  Eyes: Conjunctivae are normal.  Neck: Normal range of motion. Neck supple.  Cardiovascular: Normal rate, regular rhythm and normal heart sounds.   Pulmonary/Chest: Effort normal and breath sounds normal. No respiratory distress. She has no wheezes. She has no rales. She exhibits no tenderness.  Abdominal: Soft. Normal appearance and bowel sounds are normal. She exhibits no distension. There is no tenderness. There is no rigidity, no rebound, no guarding, no CVA tenderness, no tenderness at McBurney's point and negative Murphy's sign.  Musculoskeletal: Normal range of motion.  Neurological: She is alert and oriented to person, place, and time.  Skin: Skin is warm and dry. She is not diaphoretic.  Psychiatric: She has a normal mood and affect.    ED Course  Procedures (including critical care time) Medications  HYDROmorphone (DILAUDID) injection 2 mg (2 mg Intramuscular Given 10/25/13 0905)  diphenhydrAMINE (BENADRYL) injection 25 mg (25 mg Intramuscular Given 10/25/13 0905)    Labs Review Labs Reviewed  CBC WITH DIFFERENTIAL - Abnormal; Notable for the following:    RBC 3.58 (*)    Hemoglobin  11.2 (*)    HCT 33.7 (*)    All other components within normal limits  COMPREHENSIVE METABOLIC PANEL - Abnormal; Notable for the following:    Glucose, Bld 135 (*)    Total Bilirubin <0.2 (*)    GFR calc non Af Amer 72 (*)    GFR calc Af Amer 83 (*)    All other components within normal limits  URINALYSIS, ROUTINE W REFLEX MICROSCOPIC - Abnormal; Notable for the following:    APPearance TURBID (*)    Leukocytes, UA TRACE (*)    All other components within normal limits  URINE MICROSCOPIC-ADD ON - Abnormal; Notable for the following:    Casts GRANULAR CAST (*)    All other components within normal limits  LIPASE, BLOOD  TROPONIN I  PREGNANCY, URINE   Imaging Review Dg Abd Acute W/chest  10/25/2013   CLINICAL DATA:  Abdominal pain.  EXAM: ACUTE ABDOMEN SERIES (ABDOMEN 2 VIEW & CHEST 1 VIEW)  COMPARISON:  Two-view chest x-ray 10/01/2013. Abdomen radiographs 03/11/2013.  FINDINGS: The heart size is normal.  The lungs are clear.  Postsurgical changes are again noted in the thoracolumbar spine. Leftward curvature is stable.  Gas and stool are present throughout the colon. The bowel gas pattern is otherwise unremarkable. There is no evidence for obstruction or free air. Surgical clips are again noted within the gallbladder fossa.  IMPRESSION: 1. No acute abnormality of the chest or abdomen. 2. Unremarkable  gas pattern with gas and stool throughout the colon. 3. Postsurgical changes in the thoracolumbar spine.   Electronically Signed   By: Lawrence Santiago M.D.   On: 10/25/2013 09:49    EKG Interpretation   None       MDM   Final diagnoses:  Constipation  Abdominal pain   Filed Vitals:   10/25/13 1053  BP: 133/83  Pulse: 86  Temp:   Resp: 18    Afebrile, NAD, non-toxic appearing, AAOx4. Cardiac and Pulmonary examination unremarkable. Abdomen soft, non-tender, non-distended with normal BS. No guarding, rigidity, or rebound. I have reviewed nursing notes, vital signs, and all  appropriate lab and imaging results for this patient. EKG unremarkable. CBC with baseline anemia. CMP within normal limits or at patient's baseline, no acute change from other visits. Troponin negative, had low suspicion for atypical cardiac presentation, would suspect increase in troponin with > 24 hours of epigastric pain. Lipase wnl. UA reviewed no evidence of infection. Acute abdominal series reviewed. No free air. No evidence of bowel obstruction. Stool burden noted. Pain is likely d/t constipation. Will prescribe Miralax. Discussed dietary changes. Advised patient to use at home pain medications for pain control, but did advise patient that narcotics can worsen constipation. Patient is agreeable to plan.      Harlow Mares, PA-C 10/25/13 1428

## 2013-10-25 NOTE — ED Notes (Signed)
Pt comes in via EMS for RUQ pain that also radiates to lateral side and lower abd that pt has mistaken in past for UTIs.  Pt believes she has bowel obstruction from researching online her symptoms.

## 2013-10-25 NOTE — ED Notes (Signed)
Bed: WA04 Expected date:  Expected time:  Means of arrival:  Comments: ems 

## 2013-10-25 NOTE — Discharge Instructions (Signed)
Please follow up with your primary care physician in 1-2 days. If you do not have one please call the [facility] number listed above. Please take Miralax, Colace, and Fleet enema as prescribed. Please read all discharge instructions and return precautions.   Constipation, Adult Constipation is when a person has fewer than 3 bowel movements a week; has difficulty having a bowel movement; or has stools that are dry, hard, or larger than normal. As people grow older, constipation is more common. If you try to fix constipation with medicines that make you have a bowel movement (laxatives), the problem may get worse. Long-term laxative use may cause the muscles of the colon to become weak. A low-fiber diet, not taking in enough fluids, and taking certain medicines may make constipation worse. CAUSES   Certain medicines, such as antidepressants, pain medicine, iron supplements, antacids, and water pills.   Certain diseases, such as diabetes, irritable bowel syndrome (IBS), thyroid disease, or depression.   Not drinking enough water.   Not eating enough fiber-rich foods.   Stress or travel.  Lack of physical activity or exercise.  Not going to the restroom when there is the urge to have a bowel movement.  Ignoring the urge to have a bowel movement.  Using laxatives too much. SYMPTOMS   Having fewer than 3 bowel movements a week.   Straining to have a bowel movement.   Having hard, dry, or larger than normal stools.   Feeling full or bloated.   Pain in the lower abdomen.  Not feeling relief after having a bowel movement. DIAGNOSIS  Your caregiver will take a medical history and perform a physical exam. Further testing may be done for severe constipation. Some tests may include:   A barium enema X-ray to examine your rectum, colon, and sometimes, your small intestine.  A sigmoidoscopy to examine your lower colon.  A colonoscopy to examine your entire  colon. TREATMENT  Treatment will depend on the severity of your constipation and what is causing it. Some dietary treatments include drinking more fluids and eating more fiber-rich foods. Lifestyle treatments may include regular exercise. If these diet and lifestyle recommendations do not help, your caregiver may recommend taking over-the-counter laxative medicines to help you have bowel movements. Prescription medicines may be prescribed if over-the-counter medicines do not work.  HOME CARE INSTRUCTIONS   Increase dietary fiber in your diet, such as fruits, vegetables, whole grains, and beans. Limit high-fat and processed sugars in your diet, such as [place] fries, hamburgers, cookies, candies, and soda.   A fiber supplement may be added to your diet if you cannot get enough fiber from foods.   Drink enough fluids to keep your urine clear or pale yellow.   Exercise regularly or as directed by your caregiver.   Go to the restroom when you have the urge to go. Do not hold it.  Only take medicines as directed by your caregiver. Do not take other medicines for constipation without talking to your caregiver first. Thomas IF:   You have bright red blood in your stool.   Your constipation lasts for more than 4 days or gets worse.   You have abdominal or rectal pain.   You have thin, pencil-like stools.  You have unexplained weight loss. MAKE SURE YOU:   Understand these instructions.  Will watch your condition.  Will get help right away if you are not doing well or get worse. Document Released: 05/17/2004 Document  Revised: 11/11/2011 Document Reviewed: 05/31/2013 [facility] Patient Information 2014 [place], [place].  Polyethylene Glycol powder What is this medicine? POLYETHYLENE GLYCOL 3350 (pol ee ETH i leen; GLYE col) powder is a laxative used to treat constipation. It increases the amount of water in the stool. Bowel movements become easier and more  frequent. This medicine may be used for other purposes; ask your health care provider or pharmacist if you have questions. COMMON BRAND NAME(S): GaviLax, GlycoLax, MiraLax, [facility]  What should I tell my health care provider before I take this medicine? They need to know if you have any of these conditions: -a history of blockage of the stomach or intestine -current abdomen distension or pain -difficulty swallowing -diverticulitis, ulcerative colitis, or other chronic bowel disease -phenylketonuria -an unusual or allergic reaction to polyethylene glycol, other medicines, dyes, or preservatives -pregnant or trying to get pregnant -breast-feeding How should I use this medicine? Take this medicine by mouth. The bottle has a measuring cap that is marked with a line. Pour the powder into the cap up to the marked line (the dose is about 1 heaping tablespoon). Add the powder in the cap to a full glass (4 to 8 ounces or 120 to 240 ml) of water, juice, soda, coffee or tea. Mix the powder well. Drink the solution. Take exactly as directed. Do not take your medicine more often than directed. Talk to your pediatrician regarding the use of this medicine in children. Special care may be needed. Overdosage: If you think you have taken too much of this medicine contact a poison control center or emergency room at once. NOTE: This medicine is only for you. Do not share this medicine with others. What if I miss a dose? If you miss a dose, take it as soon as you can. If it is almost time for your next dose, take only that dose. Do not take double or extra doses. What may interact with this medicine? Interactions are not expected. This list may not describe all possible interactions. Give your health care provider a list of all the medicines, herbs, non-prescription drugs, or dietary supplements you use. Also tell them if you smoke, drink alcohol, or use illegal drugs. Some items may interact with your  medicine. What should I watch for while using this medicine? Do not use for more than 2 weeks without advice from your doctor or health care professional. It can take 2 to 4 days to have a bowel movement and to experience improvement in constipation. See your health care professional for any changes in bowel habits, including constipation, that are severe or last longer than three weeks. Always take this medicine with plenty of water. What side effects may I notice from receiving this medicine? Side effects that you should report to your doctor or health care professional as soon as possible: -diarrhea -difficulty breathing -itching of the skin, hives, or skin rash -severe bloating, pain, or distension of the stomach -vomiting Side effects that usually do not require medical attention (report to your doctor or health care professional if they continue or are bothersome): -bloating or gas -lower abdominal discomfort or cramps -nausea This list may not describe all possible side effects. Call your doctor for medical advice about side effects. You may report side effects to FDA at 1-800-FDA-1088. Where should I keep my medicine? Keep out of the reach of children. Store between 15 and 30 degrees C (59 and 86 degrees F). Throw away any unused medicine after the expiration date.  NOTE: This sheet is a summary. It may not cover all possible information. If you have questions about this medicine, talk to your doctor, pharmacist, or health care provider.  2014, Elsevier/Gold Standard. (2008-03-21 16:50:45) Sodium Phosphate Monobasic; Sodium Phosphate Dibasic enema What is this medicine? SODIUM PHOSPATE SALT (SOE dee um FOS fate sawlt) is a saline laxative. It is used to treat constipation or to clean the bowel before a colonoscopy. This medicine may be used for other purposes; ask your health care provider or pharmacist if you have questions. COMMON BRAND NAME(S): Fleet, Ready To Use Saline What  should I tell my health care provider before I take this medicine? They need to know if you have any of these conditions: -abnormal blood levels of electrolytes like sodium, phosphate, potassium or calcium -bowel problems like colitis, constipation, and obstruction -change in bowel habits lasting more than 2 weeks -chest pain -dehydration -heart failure -kidney disease -on low salt or sodium diet -stomach pain, nausea, vomiting -an unusual or allergic reaction to sodium phosphate, other medicines, foods, dyes, or preservatives -pregnant or trying to get pregnant -breast-feeding How should I use this medicine? This medicine is for rectal use only. Do not take by mouth. Follow the directions on the prescription label. Wash your hands before and after use. Remove tip from enema bottle. Gently insert enema tip into the rectum. Squeeze bottle until almost all of the medicine is inside the rectum. Remove enema tip from the rectum and stay in position until the urge to evacuate is strong. Do not take doses that are larger than those recommended on the product label or otherwise directed by your healthcare professional. Do not take more than one dose in 24 hours. Talk to your pediatrician regarding the use of this medicine in children. While this drug may be prescribed for children as young as 76 years old for selected conditions, precautions do apply. Overdosage: If you think you have taken too much of this medicine contact a poison control center or emergency room at once. NOTE: This medicine is only for you. Do not share this medicine with others. What if I miss a dose? This does not apply; this medicine is not for regular use. What may interact with this medicine? -aspirin -certain medicines used to treat high blood pressure, like captopril, enalapril, lisinopril, or candesartan, losartan, valsartan -diuretics -NSAIDS, medicines for pain and inflammation, like ibuprofen or naproxen This list may  not describe all possible interactions. Give your health care provider a list of all the medicines, herbs, non-prescription drugs, or dietary supplements you use. Also tell them if you smoke, drink alcohol, or use illegal drugs. Some items may interact with your medicine. What should I watch for while using this medicine? Do not use with any other laxatives unless your doctor tells you to. Drink fluids as directed to prevent dehydration. See your doctor right away if you do not have a bowel movement after using this medicine. What side effects may I notice from receiving this medicine? Side effects that you should report to your doctor or health care professional as soon as possible: -allergic reactions like skin rash, itching or hives, swelling of the face, lips, or tongue -irregular heart beat -rectal bleeding -seizures Side effects that usually do not require medical attention (report to your doctor or health care professional if they continue or are bothersome): -bloating -dizziness -headache -nausea and vomiting -stomach pain This list may not describe all possible side effects. Call your doctor for medical advice  about side effects. You may report side effects to FDA at 1-800-FDA-1088. Where should I keep my medicine? Keep out of the reach of children. Store at room temperature between 15 and 30 degrees C (59 and 86 degrees F). Throw away any unused medicine after the expiration date. NOTE: This sheet is a summary. It may not cover all possible information. If you have questions about this medicine, talk to your doctor, pharmacist, or health care provider.  2014, Elsevier/Gold Standard. (2012-09-10 14:54:06) Docusate capsules What is this medicine? DOCUSATE (doc CUE sayt) is stool softener. It helps prevent constipation and straining or discomfort associated with hard or dry stools. This medicine may be used for other purposes; ask your health care provider or pharmacist if you have  questions. COMMON BRAND NAME(S): Colace, Correctol, D.O.S., DC, Doc-Q-Lace, DocuLace, Docusoft S, DOK, Dulcolax, Genasoft, Kao-Tin , Kaopectate Liqui-Gels, Phillips Stool Softener, Stool Softener , Stool Softner DC , Sulfolax, Sur-Q-Lax , Surfak, Uni-Ease  What should I tell my health care provider before I take this medicine? They need to know if you have any of these conditions: -nausea or vomiting -severe constipation -stomach pain -sudden change in bowel habit lasting more than 2 weeks -an unusual or allergic reaction to docusate, other medicines, foods, dyes, or preservatives -pregnant or trying to get pregnant -breast-feeding How should I use this medicine? Take this medicine by mouth with a glass of water. Follow the directions on the label. Take your doses at regular intervals. Do not take your medicine more often than directed. Talk to your pediatrician regarding the use of this medicine in children. While this medicine may be prescribed for children as young as 2 years for selected conditions, precautions do apply. Overdosage: If you think you have taken too much of this medicine contact a poison control center or emergency room at once. NOTE: This medicine is only for you. Do not share this medicine with others. What if I miss a dose? If you miss a dose, take it as soon as you can. If it is almost time for your next dose, take only that dose. Do not take double or extra doses. What may interact with this medicine? -mineral oil This list may not describe all possible interactions. Give your health care provider a list of all the medicines, herbs, non-prescription drugs, or dietary supplements you use. Also tell them if you smoke, drink alcohol, or use illegal drugs. Some items may interact with your medicine. What should I watch for while using this medicine? Do not use for more than one week without advice from your doctor or health care professional. If your constipation returns,  check with your doctor or health care professional. Drink plenty of water while taking this medicine. Drinking water helps decrease constipation. Stop using this medicine and contact your doctor or health care professional if you experience any rectal bleeding or do not have a bowel movement after use. These could be signs of a more serious condition. What side effects may I notice from receiving this medicine? Side effects that you should report to your doctor or health care professional as soon as possible: -allergic reactions like skin rash, itching or hives, swelling of the face, lips, or tongue Side effects that usually do not require medical attention (report to your doctor or health care professional if they continue or are bothersome): -diarrhea -stomach cramps -throat irritation This list may not describe all possible side effects. Call your doctor for medical advice about side effects. You may  report side effects to FDA at 1-800-FDA-1088. Where should I keep my medicine? Keep out of the reach of children. Store at room temperature between 15 and 30 degrees C (59 and 86 degrees F). Throw away any unused medicine after the expiration date. NOTE: This sheet is a summary. It may not cover all possible information. If you have questions about this medicine, talk to your doctor, pharmacist, or health care provider.  2014, Elsevier/Gold Standard. (2007-12-10 15:56:49)

## 2013-10-26 ENCOUNTER — Other Ambulatory Visit: Payer: Self-pay | Admitting: Family Medicine

## 2013-10-31 NOTE — ED Provider Notes (Signed)
Medical screening examination/treatment/procedure(s) were performed by non-physician practitioner and as supervising physician I was immediately available for consultation/collaboration.   EKG Interpretation None        Tanna Furry, MD 10/31/13 1555

## 2013-11-01 ENCOUNTER — Telehealth: Payer: Self-pay | Admitting: Neurology

## 2013-11-01 NOTE — Telephone Encounter (Signed)
Not yet, she is scheduled for this coming Friday, but she has also rescheduled several times.  [place] spoke with her previously regarding her inability to use the machine or not using it.  When she called today, she mentioning that she had done research about medications that keep you awake such as Provigil.  She says that she is wondering if you would prescribe her something as a trial for about 30 days.

## 2013-11-01 NOTE — Telephone Encounter (Signed)
Patient has extensive phone notes by me in the system.  She was supposed to come in at Christmas time, she came in but was sick with fever.  She has scheduled and cancelled her study twice since because she is not over this illness  She is very sleepy.  This has been documented.  However, she is aware she needs to have the test done but keeps putting it off.  I haven't heard anything from her since her last cancellation until now.  I had to tell her to go back to see the doctor because she was taking her daughter's antibiotics and not getting well.  Her symptoms had persisted for over a month and she had developed wheezing with her productive cough.

## 2013-11-01 NOTE — Telephone Encounter (Signed)
The patient was supposed to come back for a BiPAP titration study. I do not see where she actually had that done. I'm not sure what she is asking me to do at this juncture. Has she had her sleep study?
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
## 2013-11-02 ENCOUNTER — Telehealth: Payer: Self-pay | Admitting: Neurology

## 2013-11-02 NOTE — Telephone Encounter (Signed)
I can justify a wake promoting agent only when she is appropriately treated and compliant with treatment with PAP. Therefore, we need to expedite her sleep study as soon as possible and then take it from there. She may not need to have a medicine to help her stay awake once she is properly treated.

## 2013-11-02 NOTE — Telephone Encounter (Signed)
Hi Dr. Rexene Alberts, looks as though Jill Phillips may have closed the previous phone note.  Pt is still calling to check the status of her request for a prescription.  Would you like for me to advise pt that your recommendation would be to proceed with the sleep study first and then afterward a face to face visit in order to assess need for med?

## 2013-11-04 ENCOUNTER — Encounter: Payer: Self-pay | Admitting: *Deleted

## 2013-11-04 ENCOUNTER — Other Ambulatory Visit: Payer: Self-pay | Admitting: Family Medicine

## 2013-11-04 NOTE — Telephone Encounter (Signed)
Medication filled x1 with no refills.   Requires office visit before any further refills can be given.   Letter sent.  

## 2013-11-05 ENCOUNTER — Other Ambulatory Visit: Payer: Self-pay

## 2013-11-05 ENCOUNTER — Ambulatory Visit (INDEPENDENT_AMBULATORY_CARE_PROVIDER_SITE_OTHER): Payer: Medicare Other

## 2013-11-05 DIAGNOSIS — G4733 Obstructive sleep apnea (adult) (pediatric): Secondary | ICD-10-CM

## 2013-11-05 DIAGNOSIS — G4731 Primary central sleep apnea: Secondary | ICD-10-CM

## 2013-11-05 DIAGNOSIS — G4761 Periodic limb movement disorder: Secondary | ICD-10-CM

## 2013-11-05 MED ORDER — CLONAZEPAM 2 MG PO TABS: 4.0000 mg | ORAL_TABLET | Freq: Every day | ORAL | Status: DC

## 2013-11-08 ENCOUNTER — Telehealth: Payer: Self-pay

## 2013-11-08 NOTE — Telephone Encounter (Signed)
I am confused. At her appt on 2/13 she said her RLS was better with the gabapentin. Given her history, I will not increase fentanyl.  She may try an additional gabapentin (total 600mg ) at bedtime.

## 2013-11-08 NOTE — Telephone Encounter (Signed)
Patient call and stated her restless leg syndrome is causing her extreme pain. She is requesting a increase in the dose on Fentanyl patches. Please advise.

## 2013-11-09 ENCOUNTER — Other Ambulatory Visit: Payer: Self-pay | Admitting: *Deleted

## 2013-11-09 ENCOUNTER — Telehealth: Payer: Self-pay | Admitting: Neurology

## 2013-11-09 DIAGNOSIS — G4761 Periodic limb movement disorder: Secondary | ICD-10-CM

## 2013-11-09 DIAGNOSIS — G4733 Obstructive sleep apnea (adult) (pediatric): Secondary | ICD-10-CM

## 2013-11-09 DIAGNOSIS — G4734 Idiopathic sleep related nonobstructive alveolar hypoventilation: Secondary | ICD-10-CM

## 2013-11-09 DIAGNOSIS — G4731 Primary central sleep apnea: Secondary | ICD-10-CM

## 2013-11-09 MED ORDER — FENTANYL 50 MCG/HR TD PT72: 50.0000 ug | MEDICATED_PATCH | TRANSDERMAL | Status: DC

## 2013-11-09 MED ORDER — HYDROCODONE-ACETAMINOPHEN 5-325 MG PO TABS: 1.0000 | ORAL_TABLET | Freq: Four times a day (QID) | ORAL | Status: DC | PRN

## 2013-11-09 NOTE — Telephone Encounter (Signed)
Pt called with concerns over the results of her titration.  She says that she has been crying 2 or 3 times per day worried about having central sleep apnea.  She says that Dr. Rexene Alberts told her that her central apneas were becoming worse with CPAP use.  She is afraid that she will die suddenly or have to have some type of brain surgery.  Attempted to calm the patient by lessening the severity that her mind has played out.    She complains that even after the titration she slept for several hours when she arrived at home.  She is beginning to doubt that CPAP/BiPAP therapy is going to help her.  [place]:  PLEASE rush pt's study for processing if possible, it is on your desk.   If Dr. Rexene Alberts is able to read this study tomorrow, could you please call pt to go over the results and address her fears/concerns?

## 2013-11-09 NOTE — Telephone Encounter (Signed)
Left detailed message per voicemail advising patient to take an additional gabapenting total 600mg  at bedtime.

## 2013-11-09 NOTE — Telephone Encounter (Signed)
RX printed for MD to sign for RN visit 11/10/13

## 2013-11-10 ENCOUNTER — Telehealth: Payer: Self-pay | Admitting: *Deleted

## 2013-11-10 ENCOUNTER — Other Ambulatory Visit: Payer: Self-pay | Admitting: Internal Medicine

## 2013-11-10 ENCOUNTER — Other Ambulatory Visit: Payer: Self-pay | Admitting: Family Medicine

## 2013-11-10 ENCOUNTER — Encounter: Payer: PRIVATE HEALTH INSURANCE | Attending: Physical Medicine & Rehabilitation | Admitting: *Deleted

## 2013-11-10 ENCOUNTER — Encounter: Payer: Self-pay | Admitting: *Deleted

## 2013-11-10 VITALS — BP 131/70 | HR 96 | Resp 14

## 2013-11-10 DIAGNOSIS — Z9981 Dependence on supplemental oxygen: Secondary | ICD-10-CM | POA: Insufficient documentation

## 2013-11-10 DIAGNOSIS — G8929 Other chronic pain: Secondary | ICD-10-CM | POA: Insufficient documentation

## 2013-11-10 DIAGNOSIS — I10 Essential (primary) hypertension: Secondary | ICD-10-CM | POA: Insufficient documentation

## 2013-11-10 DIAGNOSIS — M961 Postlaminectomy syndrome, not elsewhere classified: Secondary | ICD-10-CM

## 2013-11-10 DIAGNOSIS — M545 Low back pain, unspecified: Secondary | ICD-10-CM | POA: Insufficient documentation

## 2013-11-10 DIAGNOSIS — M170 Bilateral primary osteoarthritis of knee: Secondary | ICD-10-CM

## 2013-11-10 DIAGNOSIS — F17200 Nicotine dependence, unspecified, uncomplicated: Secondary | ICD-10-CM | POA: Insufficient documentation

## 2013-11-10 DIAGNOSIS — G2581 Restless legs syndrome: Secondary | ICD-10-CM

## 2013-11-10 DIAGNOSIS — M1710 Unilateral primary osteoarthritis, unspecified knee: Secondary | ICD-10-CM | POA: Insufficient documentation

## 2013-11-10 DIAGNOSIS — G4730 Sleep apnea, unspecified: Secondary | ICD-10-CM | POA: Insufficient documentation

## 2013-11-10 DIAGNOSIS — M75101 Unspecified rotator cuff tear or rupture of right shoulder, not specified as traumatic: Secondary | ICD-10-CM

## 2013-11-10 NOTE — Progress Notes (Signed)
Here for pill count and medication refills. Fentanyl 50 mcg #10 Fill date 10/15/13   Today NV#0  Hydrocodone 5/325 # 45 fill date 10/15/13 Today NV# 0   VSS   Jill Phillips is asking about the increase of her patches to 62 mcg for her restless legs and says that she has tried the increased gabapentin but it makes her dizzy. She said if not could she get # 60 of the hydrocodone instead of just 45 as it is not lasting her.  She said the problem is that she is still having excessive constipation even with mineral oil and other laxatives.  She said it was not quite as bad when on the talwin.  I will mention this to Jill Phillips to see if he might condsider the increase the disp on the hydrocodone or change her back to talwin.  She had her sleep study done this past Friday.  She may have to talk to that MD about the restless leg during the night.  She will return to see me for refill next month and Jill Phillips .

## 2013-11-10 NOTE — Telephone Encounter (Signed)
She will have MORE constipation the higher we dose her narcs. I would be willing to rx #60 of her hydrocodone. If the gabapentin is not helping (it was before) then she should stop it. Could try some primidone at night if she would like to try that in place of the gabapentin.

## 2013-11-10 NOTE — Telephone Encounter (Signed)
Please call and inform patient that I have entered an order for treatment with PAP. She did well during the latest sleep study with BiPAP. We will, therefore, arrange for a machine for home use through a DME (durable medical equipment) company of Her choice; and I will see the patient back in follow-up in about 6 weeks. I will change her treatment from CPAP to BiPAP and oxygen. Please also explain to the patient that I will be looking out for compliance data downloaded from the machine, which can be done remotely through a modem at times or stored on an SD card in the back of the machine. At the time of the followup appointment we will discuss sleep study results and how it is going with PAP treatment at home. Please advise patient to bring Her machine at the time of the visit; at least for the first visit, even though this is cumbersome. Bringing the machine for every visit after that may not be needed, but often helps for the first visit. Please also make sure, the patient has a follow-up appointment with me in about 6 weeks from the setup date, thanks.   Star Age, MD, PhD [facility])

## 2013-11-10 NOTE — Telephone Encounter (Signed)
Jill Phillips was in to see me today.  To avoid repeating the entire story again in the notes, see my visit today about her restless legs and what she is asking. She did try the gabapentin.

## 2013-11-10 NOTE — Telephone Encounter (Signed)
Called patient to discuss sleep study results.  Discussed findings, recommendations and follow up care.  Patient understood well and all questions were answered.  Pt's prescription will be forwarded to [facility] first thing in the morning and results will be mailed to patient and forwarded to referring physician.  Follow up appt was scheduled for 12/24/13 with Dr. Rexene Alberts

## 2013-11-10 NOTE — Patient Instructions (Signed)
Follow up one month with RN and 2 months with Naaman Plummer

## 2013-11-10 NOTE — Telephone Encounter (Signed)
Will send letter to pt to schedule appt and have BW done.

## 2013-11-10 NOTE — Telephone Encounter (Signed)
Will talk to patient after reading sleep study.

## 2013-11-11 ENCOUNTER — Encounter: Payer: Self-pay | Admitting: *Deleted

## 2013-11-11 NOTE — Telephone Encounter (Addendum)
Left voicemail for Jill Phillips to call up back --no other message left but it was: ( to let her know that Dr Naaman Plummer will allow increase of hydrocodone to 2/day # 60 and she is to call when she is needing a refill.  Also that she will undoubtedly have more constipation with the increase in narcotics.  If she would like he would be willing to change her off gabapentin to primidone at night if she would like..  (This is her decision.)

## 2013-11-11 NOTE — Telephone Encounter (Signed)
Angeliyah called back and left her mobile number and said to leave a detailed message so I left her the information that Dr Naaman Plummer gave [place] in previous message about increasing medication and for her to decide about the primidone.[name]

## 2013-11-25 ENCOUNTER — Other Ambulatory Visit: Payer: Self-pay | Admitting: Family Medicine

## 2013-11-25 NOTE — Telephone Encounter (Signed)
Medication filled x1 with no refills.   Requires office visit before any further refills can be given.  

## 2013-11-26 ENCOUNTER — Telehealth: Payer: Self-pay | Admitting: Neurology

## 2013-11-26 NOTE — Telephone Encounter (Signed)
Pt calls today with complaint that she is still sleepy despite using her Bipap.  She is sleeping all day long and wants to know if there are other alternatives than having to use the machine.  I did ask the patient about med's that she is taking that could be potentially be causing her excessive drowsiness.  She does admit to her narcotic pain regimen- she is using Fentanyl patch which was causing her an inability to stay awake so they cut her dose in half.  Her physician then prescribed her Hydrocodone for the pain associated with her restless legs, so she is taking that too.  At first she wanted Dr. Rexene Alberts to prescribe her a medication to take that would help with sleepiness, but then she adds that Dr. Brett Fairy once prescribed her Provigil a long time ago but it did not work for her.  She doesn't know what to do.  Will forward to [facility] to contact the patient with advisement regarding bipap use and benefits.

## 2013-11-28 NOTE — Telephone Encounter (Signed)
Called and LM, then patient picked up and we talked.  She feels strongly that her sleepiness isn't related to her medications.  She also states she is using her BiPAP with oxygen therapy regularly every time she sleeps.  I told her this was excellent and that was the most important thing she could do right now to help her EDS and until she sees Dr. Rexene Alberts again on the 24th of April.  I told her I will forward details of our talk to Dr. Rexene Alberts and if Dr. Rexene Alberts has any recommendations or changes for her before her appointment, I would let her know.  Otherwise, CPAP COMPLIANCE WAS NECESSARY BEFORE ANY OTHER CHANGES WOULD BE MADE.  I explained that if she comes to her appt and she hasn't been using her BiPAP, then Dr. Rexene Alberts would have to advise her to use it with compliance during all sleep periods before she could tell if her OSA/CSA was treated, basically if she doesn't use it, it will only stall any other help or decision making for her because we have to see that treated first.  She verbalized understanding.  Patient has tried Provigil previously (2005) but states it didn't help her.   First 7-8 days of BiPAP were great and she got up and felt good was able to get up and get going and stay awake during the day, then things started to go down hill and she started to feel more sleepy once again.  Patient mentions RLS is a major issue for her, she uses Klonopin and notices when she doesn't take it, things are much worse.  She states she has had the problem since she was 58 years old and described her sensations as very painful so she has to walk the floor for relief.  Patient says duragesic patch works better for RLS symptoms but her current dose isn't high enough to give her relief.  Duragesic patches "don't make me feel sleepy, I've been on them so long", she takes 60 mg right now and feels like she needs more... Says her patches have never made her feel high.  Says previously a doctor raised her Duragesic dose to  120 mg at one point in and this "caused her sleep apnea", it was later reduced to 100 mg ("which doesn't make me feel high, doesn't make me feel sleepy").  I expressed that her pain management physician would address any of her medication concerns in regards to pain meds.  Also stated that Dr. Rexene Alberts may consult with pain management in the future if she feels medication is contributing to EDS to see if there are alternatives.

## 2013-11-30 ENCOUNTER — Telehealth: Payer: Self-pay

## 2013-11-30 MED ORDER — HYDROCODONE-ACETAMINOPHEN 5-325 MG PO TABS: 1.0000 | ORAL_TABLET | Freq: Two times a day (BID) | ORAL | Status: DC

## 2013-11-30 NOTE — Telephone Encounter (Signed)
Patient aware her hydrocodone rx is ready for pick up.

## 2013-11-30 NOTE — Telephone Encounter (Signed)
Patient called requesting medication refill.  She says it was supposed to be increased.  Did not specify which medication.  Printed hydrocodone #60 bid.  Will call patient when medication is signed and ready for pick up.

## 2013-12-02 ENCOUNTER — Other Ambulatory Visit: Payer: Self-pay | Admitting: *Deleted

## 2013-12-02 MED ORDER — FENTANYL 50 MCG/HR TD PT72: 50.0000 ug | MEDICATED_PATCH | TRANSDERMAL | Status: DC

## 2013-12-02 NOTE — Telephone Encounter (Signed)
Jill Phillips is having to pick up a prescription of hydrocodone 5/325 # 60 because Dr Naaman Plummer has ok'd an increase from #45 to #60 and she was needing a refill.  She has an appt scheduled next week for refills and since she is having to pick up one we are going to let her pick up the fentanyl too. I have cancelled her RN visit and she has an appt scheduled with Dr Naaman Plummer 01/07/14 and she will keep that one.

## 2013-12-04 ENCOUNTER — Other Ambulatory Visit: Payer: Self-pay | Admitting: Family Medicine

## 2013-12-05 ENCOUNTER — Emergency Department (HOSPITAL_COMMUNITY)
Admission: EM | Admit: 2013-12-05 | Discharge: 2013-12-06 | Disposition: A | Discharge status: Home / Self Care | Payer: PRIVATE HEALTH INSURANCE | Attending: Emergency Medicine | Admitting: Emergency Medicine

## 2013-12-05 ENCOUNTER — Encounter (HOSPITAL_COMMUNITY): Payer: Self-pay | Admitting: Emergency Medicine

## 2013-12-05 DIAGNOSIS — E039 Hypothyroidism, unspecified: Secondary | ICD-10-CM | POA: Insufficient documentation

## 2013-12-05 DIAGNOSIS — I10 Essential (primary) hypertension: Secondary | ICD-10-CM | POA: Insufficient documentation

## 2013-12-05 DIAGNOSIS — Z9889 Other specified postprocedural states: Secondary | ICD-10-CM | POA: Insufficient documentation

## 2013-12-05 DIAGNOSIS — M545 Low back pain, unspecified: Secondary | ICD-10-CM | POA: Insufficient documentation

## 2013-12-05 DIAGNOSIS — Z791 Long term (current) use of non-steroidal anti-inflammatories (NSAID): Secondary | ICD-10-CM | POA: Insufficient documentation

## 2013-12-05 DIAGNOSIS — Z880 Allergy status to penicillin: Secondary | ICD-10-CM | POA: Insufficient documentation

## 2013-12-05 DIAGNOSIS — F17200 Nicotine dependence, unspecified, uncomplicated: Secondary | ICD-10-CM | POA: Insufficient documentation

## 2013-12-05 DIAGNOSIS — G894 Chronic pain syndrome: Secondary | ICD-10-CM | POA: Insufficient documentation

## 2013-12-05 DIAGNOSIS — G2581 Restless legs syndrome: Secondary | ICD-10-CM | POA: Insufficient documentation

## 2013-12-05 DIAGNOSIS — M549 Dorsalgia, unspecified: Secondary | ICD-10-CM

## 2013-12-05 DIAGNOSIS — Z79899 Other long term (current) drug therapy: Secondary | ICD-10-CM | POA: Insufficient documentation

## 2013-12-05 DIAGNOSIS — R011 Cardiac murmur, unspecified: Secondary | ICD-10-CM | POA: Insufficient documentation

## 2013-12-05 DIAGNOSIS — K219 Gastro-esophageal reflux disease without esophagitis: Secondary | ICD-10-CM | POA: Insufficient documentation

## 2013-12-05 DIAGNOSIS — E785 Hyperlipidemia, unspecified: Secondary | ICD-10-CM | POA: Insufficient documentation

## 2013-12-05 DIAGNOSIS — Z8659 Personal history of other mental and behavioral disorders: Secondary | ICD-10-CM | POA: Insufficient documentation

## 2013-12-05 DIAGNOSIS — M1710 Unilateral primary osteoarthritis, unspecified knee: Secondary | ICD-10-CM | POA: Insufficient documentation

## 2013-12-05 MED ORDER — HYDROMORPHONE HCL PF 1 MG/ML IJ SOLN
1.0000 mg | Freq: Once | INTRAMUSCULAR | Status: AC
  Administered 2013-12-05: 1 mg via INTRAMUSCULAR
  Filled 2013-12-05: qty 1

## 2013-12-05 MED ORDER — IBUPROFEN 200 MG PO TABS
600.0000 mg | ORAL_TABLET | Freq: Once | ORAL | Status: AC
  Administered 2013-12-05: 600 mg via ORAL
  Filled 2013-12-05: qty 3

## 2013-12-05 NOTE — ED Notes (Signed)
Bed: WA09 Expected date:  Expected time:  Means of arrival:  Comments: EMS 57yo F, back pain x 3 days

## 2013-12-05 NOTE — ED Notes (Addendum)
Pt arrived to the ED with a complaint of lower back pain.  Pt states pain is located in the lower right region of her back.  Pt's pain began Friday and has progressively gotten worse over the course of the weekend.  Pt describe the pain as sharp pain.  Pt takes Soma for muscle spasm of the back.  Pt also has restless leg syndrome for which she has a fentanyl patch.

## 2013-12-05 NOTE — ED Notes (Signed)
Pt walked from her room to the bathroom and back with stand by assist

## 2013-12-05 NOTE — ED Provider Notes (Signed)
CSN: [id number]     Arrival date & time 12/05/13  2202 History   First MD Initiated Contact with Patient 12/05/13 2311     Chief Complaint  Patient presents with  . Back Pain     (Consider location/radiation/quality/duration/timing/severity/associated sxs/prior Treatment) HPI Comments: Patient is a 58 year old female past medical history significant for chronic pain syndrome (16 back surgeries), HTN, HLD, GERD, hypothyroidism, OSA, tobacco use, dysthymic disorder, restless leg syndrome who presents to the ER with cc of back pain. Pt's back pain is located in the lumbar spine. Pain is sharp. No associated numbness, weakness, urinary incontinence, urinary retention, bowel incontinence, saddle anesthesia.  Pain is worse with movement, especially with walking and sitting up.  Pt is on fentanyl patch 50 mcg, hydrocodone 5 mg, SOMA. Pt states that she has an appt with Dr. Leanne Chang on Wednesday.  Patient is a 58 y.o. female presenting with back pain. The history is provided by the patient and medical records.  Back Pain Associated symptoms: no abdominal pain, no chest pain, no dysuria, no headaches and no numbness     Past Medical History  Diagnosis Date  . Postlaminectomy syndrome, thoracic region   . Osteoarthrosis, unspecified whether generalized or localized, lower leg   . Dysthymic disorder   . Calcifying tendinitis of shoulder   . Pain in joint, upper arm   . Chronic pain syndrome   . Lumbago   . Primary localized osteoarthrosis, lower leg   . Hypertension   . Hyperlipidemia   . GERD (gastroesophageal reflux disease)   . Thyroid disease   . Restless leg syndrome   . PONV (postoperative nausea and vomiting)   . Heart murmur   . Sleep apnea     s/p surgery- last sleep study 2011- doesnt use oxygen or machine at night as instructed  . History of blood transfusion 1980  . Hypothyroidism   . Active smoker    Past Surgical History  Procedure Laterality Date  . Appendectomy    .  Abdominal hysterectomy    . Tubal ligation    . Spine surgery      thoracic x 1,  lumbar x 15  . Right hip replacement    . Knee surgeries r knee      arthroscopy- right  . Hammer toe surgery    . Sleep apnea surgery    . Cholecystectomy    . Back surgery    . Joint replacement    . Total knee arthroplasty  05/29/2012    Procedure: TOTAL KNEE ARTHROPLASTY;  Surgeon: Mcarthur Rossetti, MD;  Location: [facility];  Service: Orthopedics;  Laterality: Right;  Right Total Knee Arthroplasty   Family History  Problem Relation Age of Onset  . Kidney disease Mother   . Heart disease Father   . Anuerysm Brother 29    brain  . Heart disease Brother   . Heart disease Sister 26    s/p CABG  . Hypertension Sister    History  Substance Use Topics  . Smoking status: Current Every Day Smoker -- 1.50 packs/day for 45 years    Types: Cigarettes  . Smokeless tobacco: Never Used  . Alcohol Use: No   OB History   Grav Para Term Preterm Abortions TAB SAB Ect Mult Living                 Review of Systems  Constitutional: Positive for activity change.  Respiratory: Negative for shortness of breath.  Cardiovascular: Negative for chest pain.  Gastrointestinal: Negative for nausea, vomiting and abdominal pain.  Genitourinary: Negative for dysuria, hematuria and vaginal bleeding.  Musculoskeletal: Positive for back pain and myalgias. Negative for neck pain.  Neurological: Negative for numbness and headaches.      Allergies  Amoxicillin; Chlorzoxazone; Codeine; Darvocet; Dilaudid; Flagyl; Keflex; Morphine and related; Nitrofurantoin monohyd macro; Oxycodone-acetaminophen; Percocet; and Sulfa antibiotics  Home Medications   Current Outpatient Rx  Name  Route  Sig  Dispense  Refill  . acetaminophen (TYLENOL) 500 MG tablet   Oral   Take 1,000 mg by mouth every 6 (six) hours as needed. For pain         . ALPRAZolam (XANAX) 1 MG tablet   Oral   Take 1 tablet by mouth daily as needed for  anxiety.          [name] amLODipine (NORVASC) 5 MG tablet   Oral   Take 5 mg by mouth daily.         . benazepril (LOTENSIN) 20 MG tablet   Oral   Take 20 mg by mouth daily.         . carisoprodol (SOMA) 350 MG tablet   Oral   Take 350 mg by mouth every 8 (eight) hours as needed for muscle spasms.         . clonazePAM (KLONOPIN) 2 MG tablet   Oral   Take 2 tablets (4 mg total) by mouth at bedtime.   60 tablet   3   . cloNIDine (CATAPRES) 0.1 MG tablet      TAKE 1 TABLET BY MOUTH 3 TIMES DAILY.   90 tablet   0     Requires office visit before any further refills c ...   . diclofenac sodium (VOLTAREN) 1 % GEL   Topical   Apply 2 g topically 3 (three) times daily.         [name] docusate sodium (COLACE) 100 MG capsule   Oral   Take 1 capsule (100 mg total) by mouth daily as needed for mild constipation.   10 capsule   0   . ESZOPICLONE 3 MG tablet   Oral   Take 3 mg by mouth at bedtime.          . fentaNYL (DURAGESIC - DOSED MCG/HR) 50 MCG/HR   Transdermal   Place 1 patch (50 mcg total) onto the skin every 3 (three) days.   10 patch   0   . gabapentin (NEURONTIN) 300 MG capsule   Oral   Take 300 mg by mouth 2 (two) times daily.         [name] HYDROcodone-acetaminophen (NORCO/VICODIN) 5-325 MG per tablet   Oral   Take 1 tablet by mouth 2 (two) times daily.   60 tablet   0     1 month supply   . levothyroxine (SYNTHROID, LEVOTHROID) 25 MCG tablet   Oral   Take 25 mcg by mouth daily before breakfast.         . naproxen (NAPROSYN) 500 MG tablet   Oral   Take 750 mg by mouth at bedtime as needed for mild pain or moderate pain (for pain).          [name] omeprazole (PRILOSEC) 20 MG capsule   Oral   Take 20 mg by mouth daily.         . pravastatin (PRAVACHOL) 20 MG tablet      TAKE 1 TABLET BY MOUTH DAILY.  30 tablet   0     Requires office visit before any further refills c ...   . valACYclovir (VALTREX) 500 MG tablet      TAKE 1 TABLET BY  MOUTH DAILY.   30 tablet   3   . venlafaxine (EFFEXOR-XR) 150 MG 24 hr capsule   Oral   Take 300 mg by mouth at bedtime.           BP 117/69  Pulse 76  Temp(Src) 98.1 F (36.7 C) (Oral)  Resp 16  SpO2 95% Physical Exam  Nursing note and vitals reviewed. Constitutional: She is oriented to person, place, and time. She appears well-developed and well-nourished.  HENT:  Head: Normocephalic and atraumatic.  Eyes: EOM are normal. Pupils are equal, round, and reactive to light.  Neck: Neck supple.  Cardiovascular: Normal rate, regular rhythm and normal heart sounds.   No murmur heard. Pulmonary/Chest: Effort normal. No respiratory distress.  Abdominal: Soft. She exhibits no distension. There is no tenderness. There is no rebound and no guarding.  Musculoskeletal:  Pt has tenderness over the lumbar region No step offs, no erythema. Pt has 1+ patellar reflex bilaterally. Able to discriminate between sharp and dull. Able to ambulate   Neurological: She is alert and oriented to person, place, and time.  Skin: Skin is warm and dry.    ED Course  Procedures (including critical care time) Labs Review Labs Reviewed - No data to display Imaging Review No results found.   EKG Interpretation None      MDM   Final diagnoses:  None    DDx includes: - DJD of the back - Spondylitises/ spondylosis - Sciatica - Spinal cord compression - Conus medullaris - Epidural hematoma - Epidural abscess - Lytic/pathologic fracture - Myelitis - Musculoskeletal pain  PT comes in with back pain. Chronic back pain and other pain syndrome. Seeing Dr. Leanne Chang. No red flags on hx suggesting cord involvement. Pt ambulated for me. No point tenderness. Not immunocompromised. Will give her im dilaudid and motrin in the ER and rx for 3 days of norco.   Varney Biles, MD 12/05/13 [phone]

## 2013-12-06 ENCOUNTER — Telehealth: Payer: Self-pay

## 2013-12-06 LAB — URINE MICROSCOPIC-ADD ON

## 2013-12-06 LAB — URINALYSIS, ROUTINE W REFLEX MICROSCOPIC
Glucose, UA: NEGATIVE mg/dL
Hgb urine dipstick: NEGATIVE
Ketones, ur: NEGATIVE mg/dL
Nitrite: NEGATIVE
Protein, ur: NEGATIVE mg/dL
Specific Gravity, Urine: 1.041 — ABNORMAL HIGH (ref 1.005–1.030)
Urobilinogen, UA: 1 mg/dL (ref 0.0–1.0)
pH: 5.5 (ref 5.0–8.0)

## 2013-12-06 MED ORDER — HYDROCODONE-ACETAMINOPHEN 5-325 MG PO TABS: 1.0000 | ORAL_TABLET | Freq: Four times a day (QID) | ORAL | Status: DC | PRN

## 2013-12-06 NOTE — Telephone Encounter (Signed)
Will send letter to schedule OV

## 2013-12-06 NOTE — Telephone Encounter (Signed)
I called Jill Phillips and talked with her about the pain.  She has an rx at front desk for her hydrocodone and I also placed her fentanyl rx up there as well.  She has an appt scheduled with me for refills this week and since she was picking up the increased amount of hydrocodone I canceled her appt for refill. She has an appt scheduled on 01/07/14 with Naaman Plummer so I told her to see him then.  If her back worsens we would have to put her on a cancellation list for him. She says the pain is just sharp.  ED did not find anything. They did do a UA/CX and it has some abnormal values.  She will have her daughter or "[name]' pick up her prescriptions.

## 2013-12-06 NOTE — Discharge Instructions (Signed)
We saw you in the ER for back pain. The imaging in the exam is normal, and our exam don't indicate any spinal cord involvement - and thus we feel comfortable sending you home. See your primary care doctor/ Pain control for further pain control.  Please use the back exercises to strengthen the back muscles.   Chronic Back Pain  When back pain lasts longer than 3 months, it is called chronic back pain.People with chronic back pain often go through certain periods that are more intense (flare-ups).  CAUSES Chronic back pain can be caused by wear and tear (degeneration) on different structures in your back. These structures include:  The bones of your spine (vertebrae) and the joints surrounding your spinal cord and nerve roots (facets).  The strong, fibrous tissues that connect your vertebrae (ligaments). Degeneration of these structures may result in pressure on your nerves. This can lead to constant pain. HOME CARE INSTRUCTIONS  Avoid bending, heavy lifting, prolonged sitting, and activities which make the problem worse.  Take brief periods of rest throughout the day to reduce your pain. Lying down or standing usually is better than sitting while you are resting.  Take over-the-counter or prescription medicines only as directed by your caregiver. SEEK IMMEDIATE MEDICAL CARE IF:   You have weakness or numbness in one of your legs or feet.  You have trouble controlling your bladder or bowels.  You have nausea, vomiting, abdominal pain, shortness of breath, or fainting. Document Released: 09/26/2004 Document Revised: 11/11/2011 Document Reviewed: 08/03/2011 [facility] Patient Information 2014 [place], [place].

## 2013-12-06 NOTE — Telephone Encounter (Signed)
Patient called and said she is having lower right back and leg pain. Patient went by ambulance to the ER on 4/5. She said she does not recall doing anything that would have increased her back pain.  Advised patient to try heat/ice to ease the pain. Please advise.

## 2013-12-08 ENCOUNTER — Telehealth: Payer: Self-pay

## 2013-12-08 NOTE — Telephone Encounter (Signed)
Now her back is the main problem? i'm confused, i thought we were most recently talking about RLS and knee pain!!!!!

## 2013-12-08 NOTE — Telephone Encounter (Signed)
FYI:: patient called to inform you that she went to Ortho yesterday and had X-rays done. She was also scheduled for a MRI. Orthopedist thinks that the only level in patient's back that is not fused in causing the increased back pain and spasms. Patient said she can hardly get out of bed.

## 2013-12-08 NOTE — Telephone Encounter (Signed)
Yes, she went to the ER by ambulance on 4/5 with back pain.

## 2013-12-09 ENCOUNTER — Ambulatory Visit
Admission: RE | Admit: 2013-12-09 | Discharge: 2013-12-09 | Disposition: A | Discharge status: Home / Self Care | Payer: PRIVATE HEALTH INSURANCE | Source: Ambulatory Visit | Attending: Specialist | Admitting: Specialist

## 2013-12-09 ENCOUNTER — Other Ambulatory Visit: Payer: Self-pay | Admitting: Specialist

## 2013-12-09 DIAGNOSIS — M545 Low back pain, unspecified: Secondary | ICD-10-CM

## 2013-12-09 MED ORDER — GADOBENATE DIMEGLUMINE 529 MG/ML IV SOLN
14.0000 mL | Freq: Once | INTRAVENOUS | Status: AC | PRN
  Administered 2013-12-09: 14 mL via INTRAVENOUS

## 2013-12-09 NOTE — Telephone Encounter (Addendum)
Are directions still the stay? Take 1 tablet by mouth BID, #60

## 2013-12-09 NOTE — Telephone Encounter (Signed)
May increase to 10/325---needs follow up visit for reassessment

## 2013-12-09 NOTE — Telephone Encounter (Signed)
Patient called requesting her medication be increased to 10 mg.  She is taking 2 of her 5mg  pills.  Please advise.

## 2013-12-10 MED ORDER — HYDROCODONE-ACETAMINOPHEN 10-325 MG PO TABS: 1.0000 | ORAL_TABLET | Freq: Two times a day (BID) | ORAL | Status: DC

## 2013-12-10 NOTE — Telephone Encounter (Signed)
Hydrocodone increased, patient aware of change.  Her daughter Glenard Haring will pick up rx.  Rx in front to pick up.

## 2013-12-10 NOTE — Telephone Encounter (Signed)
Same directions?

## 2013-12-13 ENCOUNTER — Other Ambulatory Visit: Payer: Self-pay | Admitting: Family Medicine

## 2013-12-13 ENCOUNTER — Encounter: Payer: Self-pay | Admitting: Family Medicine

## 2013-12-13 MED ORDER — CLONIDINE HCL 0.1 MG PO TABS: ORAL_TABLET | ORAL | Status: DC

## 2013-12-13 NOTE — Telephone Encounter (Signed)
Rx Refilled and will send letter to schedule ov

## 2013-12-16 ENCOUNTER — Encounter: Payer: Self-pay | Admitting: Neurology

## 2013-12-24 ENCOUNTER — Institutional Professional Consult (permissible substitution): Payer: Medicare Other | Admitting: Neurology

## 2013-12-24 ENCOUNTER — Telehealth: Payer: Self-pay | Admitting: *Deleted

## 2013-12-24 NOTE — Progress Notes (Signed)
Quick Note:  I reviewed the patient's BiPAP compliance data from 11/16/2013 to 12/15/2013, which is a total of 30 days, during which time the patient used BiPAP every day except for 7 days. The average usage for all days was 3 hours and 2 minutes. The percent used days greater than 4 hours was 37 %, indicating poor compliance. The residual AHI was 5.9 per hour, indicating a fairly appropriate treatment pressure of 9/5 cwp. Air leak from the mask was very acceptable at 12.3 L per minute at the 95th percentile. I will review this data with the patient at the next office visit, which is currently not scheduled. We will get in touch with the patient to schedule her appointment, and remind her to be more compliant with the usage of her BiPAP machine.  Star Age, MD, PhD [facility] ([facility]   ______

## 2013-12-24 NOTE — Telephone Encounter (Signed)
Jill Phillips called with question about note concerning medications.  I have returned her call but had to leave voicemail for her to return my call and ask for Jazlin Tapscott.  I know what she is talking about and just need to explain it to her.

## 2013-12-24 NOTE — Telephone Encounter (Signed)
I spoke with Jill Phillips and answered her questions.

## 2013-12-25 ENCOUNTER — Other Ambulatory Visit: Payer: Self-pay | Admitting: Physical Medicine & Rehabilitation

## 2013-12-27 ENCOUNTER — Other Ambulatory Visit: Payer: Self-pay

## 2013-12-27 MED ORDER — CARISOPRODOL 350 MG PO TABS: ORAL_TABLET | ORAL | Status: DC

## 2013-12-29 ENCOUNTER — Encounter: Payer: Self-pay | Admitting: Neurology

## 2013-12-29 ENCOUNTER — Telehealth: Payer: Self-pay | Admitting: *Deleted

## 2013-12-29 MED ORDER — FENTANYL 50 MCG/HR TD PT72: 50.0000 ug | MEDICATED_PATCH | TRANSDERMAL | Status: DC

## 2013-12-29 NOTE — Telephone Encounter (Signed)
Notified rx available for pick up 

## 2013-12-29 NOTE — Telephone Encounter (Addendum)
Olie has an appt with Naaman Plummer 01/07/14.  Her fentanyl was last given to her 12/02/13. She will need to pick up rx. Printed for Naaman Plummer to sign.

## 2014-01-01 ENCOUNTER — Other Ambulatory Visit: Payer: Self-pay | Admitting: Physician Assistant

## 2014-01-03 ENCOUNTER — Encounter: Payer: Self-pay | Admitting: Internal Medicine

## 2014-01-03 ENCOUNTER — Other Ambulatory Visit: Payer: Self-pay | Admitting: Physical Medicine & Rehabilitation

## 2014-01-03 ENCOUNTER — Ambulatory Visit (INDEPENDENT_AMBULATORY_CARE_PROVIDER_SITE_OTHER): Payer: PRIVATE HEALTH INSURANCE | Admitting: Internal Medicine

## 2014-01-03 VITALS — BP 125/78 | HR 87 | Temp 97.6°F | Ht 64.0 in | Wt 153.2 lb

## 2014-01-03 DIAGNOSIS — L570 Actinic keratosis: Secondary | ICD-10-CM | POA: Insufficient documentation

## 2014-01-03 DIAGNOSIS — Z01818 Encounter for other preprocedural examination: Secondary | ICD-10-CM

## 2014-01-03 MED ORDER — CETIRIZINE HCL 5 MG PO TABS: 5.0000 mg | ORAL_TABLET | Freq: Every day | ORAL | Status: DC

## 2014-01-03 MED ORDER — NICOTINE 14 MG/24HR TD PT24: 14.0000 mg | MEDICATED_PATCH | Freq: Every day | TRANSDERMAL | Status: DC

## 2014-01-03 MED ORDER — NICOTINE 7 MG/24HR TD PT24: 7.0000 mg | MEDICATED_PATCH | Freq: Every day | TRANSDERMAL | Status: DC

## 2014-01-03 MED ORDER — NICOTINE 21 MG/24HR TD PT24: 21.0000 mg | MEDICATED_PATCH | Freq: Every day | TRANSDERMAL | Status: DC

## 2014-01-03 NOTE — Progress Notes (Signed)
HPI The patient is a 58 y.o. female with a history of OSA, tobacco abuse, HTN, presenting for a pre-surgical evaluation.  The patient is scheduled to undergo R L1-2 microdiscectomy 10-18-54 (intermediate risk surgery).  The patient has no history of DM, CAD, CHF, CVA, or CKD.  At home, the patient is able to do dishes, clean her cat's litterbox, sweep floors, etc. (>4 METS).  She does not walk much due to back and hip pain, but believes she could walk at least 1 block, and walk up 1 flight of stairs without difficulty.  She notes no exertional chest pain or SOB.  The patient notes a stress test approximately 25 years ago, which she reports was normal.    The patient smokes 2 ppd, beginning smoking at age 49.  She notes previously being able to quit for a period of 3 months, after using the patches, about 15 years ago.  The patient states she would like to quit.  The patient also has a diagnosis of OSA.  She was prescribed BiPap, which she stopped using 2 weeks ago due to mask discomfort.  The patient also asks about a lesion on her right arm, which has been present for the last 1 month.  The area typically does not itch or burn, and has no drainage.   ROS: General: no fevers, chills, changes in weight, changes in appetite Skin: no rash HEENT: no blurry vision, hearing changes, sore throat Pulm: no dyspnea, coughing, wheezing CV: no chest pain, palpitations, shortness of breath Abd: no abdominal pain, nausea/vomiting, diarrhea/constipation GU: no dysuria, hematuria, polyuria Ext: see HPI Neuro: no weakness, numbness, or tingling  Filed Vitals:   01/03/14 1332  BP: 125/78  Pulse: 87  Temp: 97.6 F (36.4 C)    PEX General: alert, cooperative, and in no apparent distress HEENT: pupils equal round and reactive to light, vision grossly intact, oropharynx clear and non-erythematous  Neck: supple Lungs: clear to ascultation bilaterally, normal work of respiration, no wheezes, rales,  ronchi Heart: regular rate and rhythm, no murmurs, gallops, or rubs Abdomen: soft, non-tender, non-distended, normal bowel sounds Extremities: right forearm with small, circular papule, without significant surrounding erythema Neurologic: alert & oriented X3, cranial nerves II-XII intact, strength grossly intact, sensation intact to light touch  Current Outpatient Prescriptions on File Prior to Visit  Medication Sig Dispense Refill  . acetaminophen (TYLENOL) 500 MG tablet Take 1,000 mg by mouth every 6 (six) hours as needed. For pain      . ALPRAZolam (XANAX) 1 MG tablet Take 1 tablet by mouth daily as needed for anxiety.       [name] amLODipine (NORVASC) 5 MG tablet Take 5 mg by mouth daily.      . benazepril (LOTENSIN) 20 MG tablet Take 20 mg by mouth daily.      . carisoprodol (SOMA) 350 MG tablet TAKE 1 TABLET BY MOUTH EVERY 8 HOURS AS NEEDED.  90 tablet  3  . clonazePAM (KLONOPIN) 2 MG tablet Take 2 tablets (4 mg total) by mouth at bedtime.  60 tablet  3  . cloNIDine (CATAPRES) 0.1 MG tablet TAKE 1 TABLET BY MOUTH 3 TIMES DAILY.  90 tablet  0  . diclofenac sodium (VOLTAREN) 1 % GEL Apply 2 g topically 3 (three) times daily.      [name] docusate sodium (COLACE) 100 MG capsule Take 1 capsule (100 mg total) by mouth daily as needed for mild constipation.  10 capsule  0  . ESZOPICLONE 3 MG  tablet Take 3 mg by mouth at bedtime.       . fentaNYL (DURAGESIC - DOSED MCG/HR) 50 MCG/HR Place 1 patch (50 mcg total) onto the skin every 3 (three) days.  10 patch  0  . gabapentin (NEURONTIN) 300 MG capsule Take 300 mg by mouth 2 (two) times daily.      [name] HYDROcodone-acetaminophen (NORCO) 10-325 MG per tablet Take 1 tablet by mouth 2 (two) times daily.  60 tablet  0  . levothyroxine (SYNTHROID, LEVOTHROID) 25 MCG tablet Take 25 mcg by mouth daily before breakfast.      . naproxen (NAPROSYN) 500 MG tablet Take 750 mg by mouth at bedtime as needed for mild pain or moderate pain (for pain).       [name] omeprazole  (PRILOSEC) 20 MG capsule Take 20 mg by mouth daily.      . pravastatin (PRAVACHOL) 20 MG tablet TAKE 1 TABLET BY MOUTH DAILY. (NEED OFFICE VISIT FOR FURTHER REFILLS)  30 tablet  0  . valACYclovir (VALTREX) 500 MG tablet TAKE 1 TABLET BY MOUTH DAILY.  30 tablet  3  . venlafaxine (EFFEXOR-XR) 150 MG 24 hr capsule Take 300 mg by mouth at bedtime.        No current facility-administered medications on file prior to visit.    Assessment/Plan

## 2014-01-03 NOTE — Assessment & Plan Note (Signed)
The patient has a lesion on her right forearm, the diagnosis of which is unclear, but may represent an actinic keratosis. Given my uncertainty of the diagnosis, and the presence of the lesion on an area of thin skin overlying the bone, I believe further evaluation by a dermatologist would be beneficial for diagnosis and determination of the appropriateness of a shave biopsy. -Patient states she has a dermatologist and with whom she will schedule a followup appointment

## 2014-01-03 NOTE — Progress Notes (Signed)
Case discussed with Dr. Brown at the time of the visit.  We reviewed the resident's history and exam and pertinent patient test results.  I agree with the assessment, diagnosis, and plan of care documented in the resident's note. 

## 2014-01-03 NOTE — Patient Instructions (Signed)
General Instructions: The purpose of today's visit was to evaluate your risk for complications after surgery. -One thing to note is that quitting smoking before surgery can improve your outcomes after surgery.  Quitting 8 weeks before the surgery is ideal, but any length of time of smoking cessation can be beneficial.  To help you quit smoking, we are prescribing the Nicotine Patches: -Use a 21 mg patch, daily for 6 weeks -Then, use a 14 mg patch, daily for 2 weeks -Then, use a 7 mg patch, daily for 2 weeks -Then quit using the patch  The area on your arm may be an actinic keratosis, or an early skin cancer.  I recommend seeing a Dermatologist to consider a biopsy of the area.  Please return for a follow-up visit in 3 months.   Treatment Goals:  Goals (1 Years of Data) as of 01/03/14   None      Progress Toward Treatment Goals:  Treatment Goal 01/03/2014  Blood pressure at goal  Stop smoking smoking the same amount    Self Care Goals & Plans:  Self Care Goal 07/14/2013  Manage my medications take my medicines as prescribed; bring my medications to every visit; refill my medications on time; follow the sick day instructions if I am sick  Eat healthy foods eat more vegetables; eat fruit for snacks and desserts; eat smaller portions; drink diet soda or water instead of juice or soda  Be physically active take a walk every day; find an activity I enjoy    No flowsheet data found.   Care Management & Community Referrals:  Referral 01/03/2014  Referrals made for care management support none needed

## 2014-01-03 NOTE — Assessment & Plan Note (Signed)
The patient is scheduled to undergo an intermediate risk surgery.  Her Revised Goldman score is 0, indicating a low risk for death, non-fatal MI, or non-fatal cardiac arrest (Similar patients reportedly experience a 0.4% risk of these events).  Labs from 10/25/13 show a normal kidney function, and an EKG from 10/25/13 shows no q waves or significant ST abnormalities.  The patient does have a history of tobacco abuse, and quitting smoking prior to surgery can improve her post-op outcomes.  The patient has a history of OSA, and has recently discontinued use of BiPap. -prescribed nicotine patches for smoking cessation -instructed patient to contact [facility] about receiving a different BiPap mask to increase compliance -there are no absolute medical contraindications to proceeding with surgery.  The cardiac and pulmonary risks were discussed with the patient.

## 2014-01-07 ENCOUNTER — Other Ambulatory Visit: Payer: Self-pay | Admitting: Physical Medicine & Rehabilitation

## 2014-01-07 ENCOUNTER — Encounter
Payer: PRIVATE HEALTH INSURANCE | Attending: Physical Medicine and Rehabilitation | Admitting: Physical Medicine & Rehabilitation

## 2014-01-07 ENCOUNTER — Encounter: Payer: Self-pay | Admitting: Physical Medicine & Rehabilitation

## 2014-01-07 VITALS — BP 145/78 | HR 14 | Resp 89 | Ht 64.0 in | Wt 157.0 lb

## 2014-01-07 DIAGNOSIS — M1710 Unilateral primary osteoarthritis, unspecified knee: Secondary | ICD-10-CM

## 2014-01-07 DIAGNOSIS — M170 Bilateral primary osteoarthritis of knee: Secondary | ICD-10-CM

## 2014-01-07 DIAGNOSIS — M961 Postlaminectomy syndrome, not elsewhere classified: Secondary | ICD-10-CM | POA: Insufficient documentation

## 2014-01-07 DIAGNOSIS — M719 Bursopathy, unspecified: Secondary | ICD-10-CM | POA: Insufficient documentation

## 2014-01-07 DIAGNOSIS — Z79899 Other long term (current) drug therapy: Secondary | ICD-10-CM | POA: Insufficient documentation

## 2014-01-07 DIAGNOSIS — G2581 Restless legs syndrome: Secondary | ICD-10-CM | POA: Insufficient documentation

## 2014-01-07 DIAGNOSIS — IMO0002 Reserved for concepts with insufficient information to code with codable children: Secondary | ICD-10-CM | POA: Insufficient documentation

## 2014-01-07 DIAGNOSIS — M2170 Unequal limb length (acquired), unspecified site: Secondary | ICD-10-CM

## 2014-01-07 DIAGNOSIS — Z5181 Encounter for therapeutic drug level monitoring: Secondary | ICD-10-CM | POA: Insufficient documentation

## 2014-01-07 DIAGNOSIS — M75101 Unspecified rotator cuff tear or rupture of right shoulder, not specified as traumatic: Secondary | ICD-10-CM

## 2014-01-07 DIAGNOSIS — M67919 Unspecified disorder of synovium and tendon, unspecified shoulder: Secondary | ICD-10-CM | POA: Insufficient documentation

## 2014-01-07 MED ORDER — HYDROCODONE-ACETAMINOPHEN 10-325 MG PO TABS: 1.0000 | ORAL_TABLET | Freq: Two times a day (BID) | ORAL | Status: DC

## 2014-01-07 MED ORDER — FENTANYL 50 MCG/HR TD PT72: 50.0000 ug | MEDICATED_PATCH | TRANSDERMAL | Status: DC

## 2014-01-07 NOTE — Patient Instructions (Signed)
Continue to work on your posture. Try using a mirror to give yourself visual cues

## 2014-01-07 NOTE — Progress Notes (Signed)
Subjective:    Patient ID: Jill Phillips, female    DOB: [birth year], 58 y.o.   MRN: [id number]  HPI  Jill Phillips is back regarding her chronic pain. She has had continued back pain and Dr. [name] ordered an MRI which showed an HNP at L1---she is set to have surgery later this month. Her hydrocodone isn't always covering her pain. Sometimes she needs another half tablet which does the trick.   She is also struggling with posture and pain in her low back/pelvic area as well as her right hip.   She is trying to get accustomed to the use of her CPAP unit.   Pain Inventory Average Pain 4 Pain Right Now 5 My pain is aching  In the last 24 hours, has pain interfered with the following? General activity 3 Relation with others 0 Enjoyment of life 3 What TIME of day is your pain at its worst? evening Sleep (in general) Poor  Pain is worse with: walking, standing and some activites Pain improves with: rest and medication Relief from Meds: 7  Mobility walk without assistance transfers alone Do you have any goals in this area?  no  Function Do you have any goals in this area?  no  Neuro/Psych bladder control problems  Prior Studies Any changes since last visit?  no  Physicians involved in your care Any changes since last visit?  no   Family History  Problem Relation Age of Onset  . Kidney disease Mother   . Heart disease Father   . Anuerysm Brother 29    brain  . Heart disease Brother   . Heart disease Sister 27    s/p CABG  . Hypertension Sister    History   Social History  . Marital Status: Divorced    Spouse Name: n/a    Number of Children: 2  . Years of Education: 12+   Occupational History  . disability     back surgeries   Social History Main Topics  . Smoking status: Current Every Day Smoker -- 1.50 packs/day for 45 years    Types: Cigarettes  . Smokeless tobacco: Never Used     Comment: SMOKES 2PPD/ NOT READY TO QUIT  . Alcohol Use: No  . Drug Use: No    . Sexual Activity: Not Currently    Partners: Male   Other Topics Concern  . None   Social History Narrative   Lives alone.  One daughter is local, but is getting ready to move to [place], where her children live with their father.  The other daughter lives near [place], [place].   Past Surgical History  Procedure Laterality Date  . Appendectomy    . Abdominal hysterectomy    . Tubal ligation    . Spine surgery      thoracic x 1,  lumbar x 15  . Right hip replacement    . Knee surgeries r knee      arthroscopy- right  . Hammer toe surgery    . Sleep apnea surgery    . Cholecystectomy    . Back surgery    . Joint replacement    . Total knee arthroplasty  05/29/2012    Procedure: TOTAL KNEE ARTHROPLASTY;  Surgeon: Mcarthur Rossetti, MD;  Location: [facility];  Service: Orthopedics;  Laterality: Right;  Right Total Knee Arthroplasty   Past Medical History  Diagnosis Date  . Postlaminectomy syndrome, thoracic region   . Osteoarthrosis, unspecified whether generalized or localized, lower  leg   . Dysthymic disorder   . Calcifying tendinitis of shoulder   . Pain in joint, upper arm   . Chronic pain syndrome   . Lumbago   . Primary localized osteoarthrosis, lower leg   . Hypertension   . Hyperlipidemia   . GERD (gastroesophageal reflux disease)   . Thyroid disease   . Restless leg syndrome   . PONV (postoperative nausea and vomiting)   . Heart murmur   . Sleep apnea     s/p surgery- last sleep study 2011- doesnt use oxygen or machine at night as instructed  . History of blood transfusion 1980  . Hypothyroidism   . Active smoker    BP 145/78  Pulse 14  Resp 89  Ht 5\' 4"  (1.626 m)  Wt 157 lb (71.215 kg)  BMI 26.94 kg/m2  SpO2 97%  Opioid Risk Score:   Fall Risk Score: Moderate Fall Risk (6-13 points) (patient educated handout declined)   Review of Systems  Musculoskeletal: Positive for arthralgias and myalgias.  All other systems reviewed and are negative.       Objective:   Physical Exam Constitutional: She is oriented to person, place, and time. She appears well-developed and well-nourished.  HENT:  Head: Normocephalic.  Eyes: EOM are normal. Pupils are equal, round, and reactive to light.  Neck: Normal range of motion.  Cardiovascular: Normal rate.  Pulmonary/Chest: Effort normal.  Abdominal: Soft.  Musculoskeletal:  Right knee with minimal tenderness. Post-op scars noted. Knee rom 120+ with flexion. Weight bearing better with minimal antalgia seen.  Right shoulder with only mild tenderness with ROM. No shoulder instability noted. Had full AROM. Low back/pelvic with hemi-elevation of the right pelvis and tightness of the lumbar paraspinals. She is limited with all plains of ROM. Generalized tenderness of c-spine with some limitation in ROM present Neurological: She is alert and oriented to person, place, and time.  Skin: Skin is warm.  Psychiatric: She has a normal mood and affect. Her behavior is normal. Judgment and thought content normal.    Assessment & Plan:   ASSESSMENT:  1. 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 Osteoarthritis of the knees bilaterally, right greater than left.  3. Rotator cuff syndrome/subacromial bursitis.  4. Restless legs syndrome.  5. O2 dependent sleep apnea.  6. Tobacco abuse.  7. Right leg length discrepancy.    PLAN:  1. Will continue fentanyl to 50 mcg every 72 hours. Will give her an additional 15 hydrcodone for breakthrough pain to make it #75 per month. We can rx any additional pain medication after surgery if she in fact needs it.  2. Continue with 4mg  klonopin at night as well as gabapentin 300mg  bid.  3. Continue to use CPAP. She'll need to contact the vendor regarding the water chamber lid which is apparently "stuck". 4. Discussed the importance of regular exercise, stretching. She may use her soft corset occasionally if she fatigues.  A mirror might be helpful for her  posture. 5. Follow up with my NP in one month. 30 minutes of face to face patient care time were spent during this visit. All questions were encouraged and answered.

## 2014-01-11 ENCOUNTER — Other Ambulatory Visit: Payer: Self-pay | Admitting: *Deleted

## 2014-01-12 MED ORDER — CLONIDINE HCL 0.1 MG PO TABS: ORAL_TABLET | ORAL | Status: DC

## 2014-01-12 NOTE — Telephone Encounter (Signed)
Dr Stann Mainland, this pt is indeed [facility] pt and for now you are pcp, i think what happened was pt was being seen by sports medicine and because of their link to family medicine the pharmacy sent it to family instead of sports med. In any case she is a pt at [facility]

## 2014-01-12 NOTE — Telephone Encounter (Signed)
I will refill a one month prescription of this medication.  However, it appears that Dr. Dennard Schaumann (family practice at [facility]?) sent it in for her in 12/2013 so we need to clarify who her PCP is. Thanks

## 2014-01-13 ENCOUNTER — Other Ambulatory Visit (HOSPITAL_COMMUNITY): Payer: Self-pay | Admitting: Specialist

## 2014-01-20 ENCOUNTER — Encounter (HOSPITAL_COMMUNITY)
Admit: 2014-01-20 | Discharge: 2014-01-20 | Disposition: A | Discharge status: Home / Self Care | Payer: PRIVATE HEALTH INSURANCE | Attending: Specialist | Admitting: Specialist

## 2014-01-20 ENCOUNTER — Encounter (HOSPITAL_COMMUNITY): Payer: Self-pay

## 2014-01-20 DIAGNOSIS — Z01812 Encounter for preprocedural laboratory examination: Secondary | ICD-10-CM | POA: Insufficient documentation

## 2014-01-20 LAB — COMPREHENSIVE METABOLIC PANEL
ALT: 16 U/L (ref 0–35)
AST: 24 U/L (ref 0–37)
Albumin: 4.1 g/dL (ref 3.5–5.2)
Alkaline Phosphatase: 127 U/L — ABNORMAL HIGH (ref 39–117)
BUN: 15 mg/dL (ref 6–23)
CO2: 26 mEq/L (ref 19–32)
Calcium: 8.8 mg/dL (ref 8.4–10.5)
Chloride: 102 mEq/L (ref 96–112)
Creatinine, Ser: 1.02 mg/dL (ref 0.50–1.10)
GFR calc Af Amer: 69 mL/min — ABNORMAL LOW (ref >90)
GFR calc non Af Amer: 59 mL/min — ABNORMAL LOW (ref >90)
Glucose, Bld: 86 mg/dL (ref 70–99)
Potassium: 3.8 mEq/L (ref 3.7–5.3)
Sodium: 144 mEq/L (ref 137–147)
Total Bilirubin: <0.2 mg/dL — ABNORMAL LOW (ref 0.3–1.2)
Total Protein: 7.4 g/dL (ref 6.0–8.3)

## 2014-01-20 LAB — CBC
HCT: 37.3 % (ref 36.0–46.0)
Hemoglobin: 12.2 g/dL (ref 12.0–15.0)
MCH: 31.4 pg (ref 26.0–34.0)
MCHC: 32.7 g/dL (ref 30.0–36.0)
MCV: 96.1 fL (ref 78.0–100.0)
Platelets: 253 10*3/uL (ref 150–400)
RBC: 3.88 MIL/uL (ref 3.87–5.11)
RDW: 13.7 % (ref 11.5–15.5)
WBC: 8.5 10*3/uL (ref 4.0–10.5)

## 2014-01-20 LAB — SURGICAL PCR SCREEN
MRSA, PCR: NEGATIVE
Staphylococcus aureus: NEGATIVE

## 2014-01-20 LAB — TYPE AND SCREEN
ABO/RH(D): A POS
Antibody Screen: NEGATIVE

## 2014-01-20 NOTE — Progress Notes (Signed)
Requested Sleep Study from [facility].

## 2014-01-20 NOTE — Pre-Procedure Instructions (Signed)
MAITLYN VANDERHOOF  01/20/2014   Your procedure is scheduled on: 01-28-2014    Friday   Report to [facility] Admitting at 10:15 AM.  Call this number if you have problems the morning of surgery: [phone]   Remember:   Do not eat food or drink liquids after midnight.    Take these medicines the morning of surgery with A SIP OF WATER: inhaler as needed,xanax if needed,amlodipine,soma if needed,clonidine,colace,pain medication if needed,synthroid,omeprazole,valtrix   Do not wear jewelry, make-up or nail polish.  Do not wear lotions, powders, or perfumes.   Do not shave 48 hours prior to surgery. Men may shave face and neck.  Do not bring valuables to the hospital.  [facility] is not responsible   for any belongings or valuables.               Contacts, dentures or bridgework may not be worn into surgery.   Leave suitcase in the car. After surgery it may be brought to your room.   For patients admitted to the hospital, discharge time is determined by your  treatment team.               Patients discharged the day of surgery will not be allowed to drive home.   Name and phone number of your driver:    Special Instructions: See attached sheet for instructions on [email or web address]   Please read over the following fact sheets that you were given: Pain Booklet, Coughing and Deep Breathing, Blood Transfusion Information and Surgical Site Infection Prevention

## 2014-01-26 NOTE — H&P (Signed)
Jill Phillips is an 58 y.o. female.   Chief Complaint: back and right hip pain HPI:  She has been experiencing pain and discomfort in her back since September. She is noticing increasing difficulty accomplishing tasks at home including keeping her house clean or do her laundry.  She has been experiencing discomfort and pain into her hip.  She has disks that are fused extending from L3 to sacrum and then also in the lower dorsal spine region at T11-12.  Open disk spaces at T12-L1, L1-L2 and L2-3.  Her pain more recently has been into the right side.  Pain into her right posterior pelvis and right hip buttock area.  She was in the emergency room previously where Urinalysis was negative and she was prescribed medicines and then told to be seen by Dr [name].  Pain along the right posterior lower rib area and into the dorsal spine.  The right paralumbar muscles with spasm.   She is walking, but she notes that she is having increasing difficulties doing any activity at home. She is not really able to drive because of the persistent pain in her back, radiation to the right buttock and right leg.     She underwent MRI scan on 12/09/2013.   It shows what appears to be a progressive broad-based disk herniation.  It is present at L1-2 and extends inferior to L1-2 in the right lateral recess as far as 10 mm below the superior endplate.  L2-3 with progressive broad-based disk protrusion, asymmetric to the left side.  Some encroachment in the left inferior recess.   She has an extruded disk fragment that extends along the posterior aspect of the disk space at the L1-2 level within the right-sided lateral recess.  The disk protrusion at L2-3 is not significant in terms of nerve compression.  The right-sided L1-2 appears to impress on the right side at the subarticular lateral recess and affects the L2 nerve root and likely is the source of pain into her right groin.    Past Medical History  Diagnosis Date  .  Postlaminectomy syndrome, thoracic region   . Osteoarthrosis, unspecified whether generalized or localized, lower leg   . Dysthymic disorder   . Calcifying tendinitis of shoulder   . Pain in joint, upper arm   . Chronic pain syndrome   . Lumbago   . Primary localized osteoarthrosis, lower leg   . Hypertension   . Hyperlipidemia   . GERD (gastroesophageal reflux disease)   . Thyroid disease   . Restless leg syndrome   . PONV (postoperative nausea and vomiting)   . Heart murmur   . Sleep apnea     s/p surgery- last sleep study 2011- doesnt use oxygen or machine at night as instructed  . History of blood transfusion 1980  . Hypothyroidism   . Active smoker     Past Surgical History  Procedure Laterality Date  . Appendectomy    . Abdominal hysterectomy    . Tubal ligation    . Spine surgery      thoracic x 1,  lumbar x 15  . Right hip replacement    . Knee surgeries r knee      arthroscopy- right  . Hammer toe surgery    . Sleep apnea surgery    . Cholecystectomy    . Joint replacement    . Total knee arthroplasty  05/29/2012    Procedure: TOTAL KNEE ARTHROPLASTY;  Surgeon: Mcarthur Rossetti, MD;  Location:  [facility];  Service: Orthopedics;  Laterality: Right;  Right Total Knee Arthroplasty  . Back surgery      16 back surgeries    Family History  Problem Relation Age of Onset  . Kidney disease Mother   . Heart disease Father   . Anuerysm Brother 29    brain  . Heart disease Brother   . Heart disease Sister 11    s/p CABG  . Hypertension Sister    Social History:  reports that she has been smoking Cigarettes.  She has a 90 pack-year smoking history. She has never used smokeless tobacco. She reports that she does not drink alcohol or use illicit drugs.  Allergies:  Allergies  Allergen Reactions  . Amoxicillin Other (See Comments)    REACTION: Oral yeast infection  . Chlorzoxazone Other (See Comments)    REACTION: headache  . Codeine Other (See Comments)     REACTION: headache  . Darvocet [Propoxyphene N-Acetaminophen] Itching  . Dilaudid [Hydromorphone Hcl] Itching  . Flagyl [Metronidazole] Diarrhea  . Keflex [Cephalexin] Other (See Comments)    REACTION: unknown  . Morphine And Related Itching  . Nitrofurantoin Monohyd Macro Hives  . Oxycodone-Acetaminophen Itching  . Percocet [Oxycodone-Acetaminophen] Itching  . Sulfa Antibiotics Other (See Comments)    REACTION: Yeast infection in mouth    Medications Prior to Admission  Medication Sig Dispense Refill  . acetaminophen (TYLENOL) 500 MG tablet Take 1,000 mg by mouth every 6 (six) hours as needed for moderate pain. For pain      . albuterol (PROVENTIL HFA;VENTOLIN HFA) 108 (90 BASE) MCG/ACT inhaler Inhale 1 puff into the lungs every 6 (six) hours as needed for wheezing or shortness of breath.      . ALPRAZolam (XANAX) 1 MG tablet Take 1 tablet by mouth daily as needed for anxiety.       [name] amLODipine (NORVASC) 5 MG tablet Take 5 mg by mouth daily.      . benazepril (LOTENSIN) 20 MG tablet Take 20 mg by mouth daily.      . carisoprodol (SOMA) 350 MG tablet Take 350 mg by mouth every 8 (eight) hours as needed for muscle spasms.      . clonazePAM (KLONOPIN) 2 MG tablet Take 2 tablets (4 mg total) by mouth at bedtime.  60 tablet  3  . cloNIDine (CATAPRES) 0.1 MG tablet Take 0.1 mg by mouth 3 (three) times daily.      [name] ESZOPICLONE 3 MG tablet Take 3 mg by mouth at bedtime.       . fentaNYL (DURAGESIC - DOSED MCG/HR) 50 MCG/HR Place 1 patch (50 mcg total) onto the skin every 3 (three) days.  10 patch  0  . gabapentin (NEURONTIN) 300 MG capsule Take 300 mg by mouth 2 (two) times daily.      [name] HYDROcodone-acetaminophen (NORCO) 10-325 MG per tablet Take 0.5-1 tablets by mouth 2 (two) times daily as needed for moderate pain.      [name] levothyroxine (SYNTHROID, LEVOTHROID) 25 MCG tablet Take 25 mcg by mouth daily before breakfast.      . naproxen (NAPROSYN) 500 MG tablet Take 750 mg by mouth at bedtime  as needed for moderate pain.      [name] omeprazole (PRILOSEC) 20 MG capsule Take 20 mg by mouth daily.      . pravastatin (PRAVACHOL) 20 MG tablet Take 20 mg by mouth daily.      [name] venlafaxine (EFFEXOR-XR) 150 MG 24 hr capsule Take 300  mg by mouth at bedtime.       . diclofenac sodium (VOLTAREN) 1 % GEL Apply 2 g topically 3 (three) times daily.      [name] docusate sodium (COLACE) 100 MG capsule Take 1 capsule (100 mg total) by mouth daily as needed for mild constipation.  10 capsule  0  . valACYclovir (VALTREX) 500 MG tablet Take 500 mg by mouth daily.        No results found for this or any previous visit (from the past 48 hour(s)). No results found.  Review of Systems  Constitutional: Positive for malaise/fatigue.  Musculoskeletal: Positive for back pain and joint pain.  Neurological: Positive for focal weakness.       Right hip    Blood pressure 106/66, pulse 81, temperature 98 F (36.7 C), temperature source Oral, resp. rate 18, height 5\' 4"  (1.626 m), weight 71.016 kg (156 lb 9 oz), SpO2 95.00%. Physical Exam  Constitutional: She is oriented to person, place, and time. She appears well-developed and well-nourished.  HENT:  Head: Normocephalic and atraumatic.  Eyes: EOM are normal. Pupils are equal, round, and reactive to light.  Neck: Normal range of motion.  Cardiovascular: Normal rate, regular rhythm, normal heart sounds and intact distal pulses.   Respiratory: Effort normal and breath sounds normal.  GI: Soft.  Musculoskeletal:  Pain with ROM of LS spine.  Tender along the paraspinous muscles on right with spasm noted.  No weakness of LEs  Pain with right hip flexion and extension however FROM of right hip. Negative SLR bil.  Neurological: She is alert and oriented to person, place, and time.  Skin: Skin is warm and dry.  Psychiatric: She has a normal mood and affect.      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 ]   PLAN:  Microdiscectomy right L1-2  [name] 01/28/2014, 11:18 AM

## 2014-01-27 MED ORDER — VANCOMYCIN HCL IN DEXTROSE 1-5 GM/200ML-% IV SOLN
1000.0000 mg | INTRAVENOUS | Status: AC
  Administered 2014-01-28: 1000 mg via INTRAVENOUS
  Filled 2014-01-27: qty 200

## 2014-01-27 NOTE — Progress Notes (Signed)
Spoke with pt and informed her of surgery time and to arrive at short stay at 0900 with stated understanding.

## 2014-01-28 ENCOUNTER — Encounter (HOSPITAL_COMMUNITY)
Admission: RE | Disposition: A | Discharge status: Home / Self Care | Payer: Medicaid Other | Source: Ambulatory Visit | Attending: Specialist

## 2014-01-28 ENCOUNTER — Encounter (HOSPITAL_COMMUNITY): Payer: PRIVATE HEALTH INSURANCE | Admitting: Anesthesiology

## 2014-01-28 ENCOUNTER — Encounter (HOSPITAL_COMMUNITY): Payer: Self-pay

## 2014-01-28 ENCOUNTER — Ambulatory Visit (HOSPITAL_COMMUNITY): Payer: PRIVATE HEALTH INSURANCE | Admitting: Anesthesiology

## 2014-01-28 ENCOUNTER — Ambulatory Visit (HOSPITAL_COMMUNITY): Payer: PRIVATE HEALTH INSURANCE

## 2014-01-28 ENCOUNTER — Observation Stay (HOSPITAL_COMMUNITY)
Admission: RE | Admit: 2014-01-28 | Discharge: 2014-01-29 | Disposition: A | Discharge status: Home / Self Care | Payer: PRIVATE HEALTH INSURANCE | Source: Ambulatory Visit | Attending: Specialist | Admitting: Specialist

## 2014-01-28 DIAGNOSIS — Z96659 Presence of unspecified artificial knee joint: Secondary | ICD-10-CM | POA: Insufficient documentation

## 2014-01-28 DIAGNOSIS — G4730 Sleep apnea, unspecified: Secondary | ICD-10-CM | POA: Insufficient documentation

## 2014-01-28 DIAGNOSIS — K219 Gastro-esophageal reflux disease without esophagitis: Secondary | ICD-10-CM | POA: Insufficient documentation

## 2014-01-28 DIAGNOSIS — I10 Essential (primary) hypertension: Secondary | ICD-10-CM | POA: Insufficient documentation

## 2014-01-28 DIAGNOSIS — E785 Hyperlipidemia, unspecified: Secondary | ICD-10-CM | POA: Insufficient documentation

## 2014-01-28 DIAGNOSIS — E039 Hypothyroidism, unspecified: Secondary | ICD-10-CM | POA: Insufficient documentation

## 2014-01-28 DIAGNOSIS — F17200 Nicotine dependence, unspecified, uncomplicated: Secondary | ICD-10-CM | POA: Insufficient documentation

## 2014-01-28 DIAGNOSIS — G2581 Restless legs syndrome: Secondary | ICD-10-CM | POA: Insufficient documentation

## 2014-01-28 DIAGNOSIS — M5126 Other intervertebral disc displacement, lumbar region: Principal | ICD-10-CM | POA: Diagnosis present

## 2014-01-28 HISTORY — PX: LUMBAR LAMINECTOMY/DECOMPRESSION MICRODISCECTOMY: SHX5026

## 2014-01-28 SURGERY — LUMBAR LAMINECTOMY/DECOMPRESSION MICRODISCECTOMY
Anesthesia: General | Site: Back

## 2014-01-28 MED ORDER — HYDROCODONE-ACETAMINOPHEN 10-325 MG PO TABS: 1.0000 | ORAL_TABLET | ORAL | Status: DC | PRN

## 2014-01-28 MED ORDER — LIDOCAINE HCL 4 % MT SOLN
OROMUCOSAL | Status: DC | PRN
  Administered 2014-01-28: 5 mL via TOPICAL

## 2014-01-28 MED ORDER — ROCURONIUM BROMIDE 100 MG/10ML IV SOLN
INTRAVENOUS | Status: DC | PRN
  Administered 2014-01-28: 50 mg via INTRAVENOUS

## 2014-01-28 MED ORDER — 0.9 % SODIUM CHLORIDE (POUR BTL) OPTIME
TOPICAL | Status: DC | PRN
  Administered 2014-01-28: 1000 mL

## 2014-01-28 MED ORDER — LIDOCAINE HCL (CARDIAC) 20 MG/ML IV SOLN
INTRAVENOUS | Status: AC
  Filled 2014-01-28: qty 5

## 2014-01-28 MED ORDER — ACETAMINOPHEN 650 MG RE SUPP: 650.0000 mg | RECTAL | Status: DC | PRN

## 2014-01-28 MED ORDER — DOCUSATE SODIUM 100 MG PO CAPS: 100.0000 mg | ORAL_CAPSULE | Freq: Every day | ORAL | Status: DC | PRN

## 2014-01-28 MED ORDER — BENAZEPRIL HCL 20 MG PO TABS
20.0000 mg | ORAL_TABLET | Freq: Every day | ORAL | Status: DC
  Filled 2014-01-28 (×2): qty 1

## 2014-01-28 MED ORDER — BUPIVACAINE-EPINEPHRINE (PF) 0.5% -1:200000 IJ SOLN
INTRAMUSCULAR | Status: AC
  Filled 2014-01-28: qty 30

## 2014-01-28 MED ORDER — ACETAMINOPHEN 500 MG PO TABS: 1000.0000 mg | ORAL_TABLET | Freq: Four times a day (QID) | ORAL | Status: DC | PRN

## 2014-01-28 MED ORDER — BUPIVACAINE HCL (PF) 0.25 % IJ SOLN
INTRAMUSCULAR | Status: DC | PRN
  Administered 2014-01-28: 20 mL

## 2014-01-28 MED ORDER — THROMBIN 20000 UNITS EX KIT
PACK | CUTANEOUS | Status: DC | PRN
  Administered 2014-01-28: 20000 [IU] via TOPICAL

## 2014-01-28 MED ORDER — SENNOSIDES-DOCUSATE SODIUM 8.6-50 MG PO TABS: 1.0000 | ORAL_TABLET | Freq: Every evening | ORAL | Status: DC | PRN

## 2014-01-28 MED ORDER — HYDROMORPHONE HCL PF 1 MG/ML IJ SOLN
0.5000 mg | INTRAMUSCULAR | Status: DC | PRN
  Administered 2014-01-28: 1 mg via INTRAVENOUS
  Filled 2014-01-28 (×2): qty 1

## 2014-01-28 MED ORDER — MORPHINE SULFATE 2 MG/ML IJ SOLN: 1.0000 mg | INTRAMUSCULAR | Status: DC | PRN

## 2014-01-28 MED ORDER — LEVOTHYROXINE SODIUM 25 MCG PO TABS
25.0000 ug | ORAL_TABLET | Freq: Every day | ORAL | Status: DC
  Administered 2014-01-29: 25 ug via ORAL
  Filled 2014-01-28 (×2): qty 1

## 2014-01-28 MED ORDER — THROMBIN 20000 UNITS EX SOLR
CUTANEOUS | Status: AC
  Filled 2014-01-28: qty 20000

## 2014-01-28 MED ORDER — ALPRAZOLAM 0.5 MG PO TABS: 1.0000 mg | ORAL_TABLET | Freq: Every day | ORAL | Status: DC | PRN

## 2014-01-28 MED ORDER — GABAPENTIN 300 MG PO CAPS
300.0000 mg | ORAL_CAPSULE | Freq: Two times a day (BID) | ORAL | Status: DC
  Administered 2014-01-28: 300 mg via ORAL
  Filled 2014-01-28 (×3): qty 1

## 2014-01-28 MED ORDER — FENTANYL CITRATE 0.05 MG/ML IJ SOLN
INTRAMUSCULAR | Status: AC
  Filled 2014-01-28: qty 5

## 2014-01-28 MED ORDER — KETOROLAC TROMETHAMINE 30 MG/ML IJ SOLN
30.0000 mg | Freq: Four times a day (QID) | INTRAMUSCULAR | Status: DC
  Administered 2014-01-28 – 2014-01-29 (×2): 30 mg via INTRAVENOUS
  Filled 2014-01-28 (×3): qty 1

## 2014-01-28 MED ORDER — ONDANSETRON HCL 4 MG/2ML IJ SOLN: 4.0000 mg | INTRAMUSCULAR | Status: DC | PRN

## 2014-01-28 MED ORDER — BUPIVACAINE LIPOSOME 1.3 % IJ SUSP
20.0000 mL | Freq: Once | INTRAMUSCULAR | Status: DC
  Filled 2014-01-28: qty 20

## 2014-01-28 MED ORDER — CARISOPRODOL 350 MG PO TABS
350.0000 mg | ORAL_TABLET | Freq: Three times a day (TID) | ORAL | Status: DC | PRN
  Administered 2014-01-28 – 2014-01-29 (×3): 350 mg via ORAL
  Filled 2014-01-28 (×3): qty 1

## 2014-01-28 MED ORDER — FENTANYL 50 MCG/HR TD PT72
50.0000 ug | MEDICATED_PATCH | TRANSDERMAL | Status: DC
Start: 2014-01-28 — End: 2014-01-29
  Administered 2014-01-28: 50 ug via TRANSDERMAL
  Filled 2014-01-28: qty 1

## 2014-01-28 MED ORDER — ACETAMINOPHEN 325 MG PO TABS: 650.0000 mg | ORAL_TABLET | ORAL | Status: DC | PRN

## 2014-01-28 MED ORDER — PHENOL 1.4 % MT LIQD: 1.0000 | OROMUCOSAL | Status: DC | PRN

## 2014-01-28 MED ORDER — PROPOFOL 10 MG/ML IV BOLUS
INTRAVENOUS | Status: AC
  Filled 2014-01-28: qty 20

## 2014-01-28 MED ORDER — THROMBIN 5000 UNITS EX SOLR
CUTANEOUS | Status: AC
  Filled 2014-01-28: qty 5000

## 2014-01-28 MED ORDER — LACTATED RINGERS IV SOLN
INTRAVENOUS | Status: DC
  Administered 2014-01-28 (×3): via INTRAVENOUS

## 2014-01-28 MED ORDER — ZOLPIDEM TARTRATE 5 MG PO TABS: 5.0000 mg | ORAL_TABLET | Freq: Every evening | ORAL | Status: DC | PRN

## 2014-01-28 MED ORDER — CLONAZEPAM 1 MG PO TABS
4.0000 mg | ORAL_TABLET | Freq: Every day | ORAL | Status: DC
  Administered 2014-01-28: 4 mg via ORAL
  Filled 2014-01-28: qty 4

## 2014-01-28 MED ORDER — ALBUTEROL SULFATE HFA 108 (90 BASE) MCG/ACT IN AERS: 1.0000 | INHALATION_SPRAY | Freq: Four times a day (QID) | RESPIRATORY_TRACT | Status: DC | PRN

## 2014-01-28 MED ORDER — PHENYLEPHRINE HCL 10 MG/ML IJ SOLN
INTRAMUSCULAR | Status: DC | PRN
  Administered 2014-01-28 (×2): 40 ug via INTRAVENOUS

## 2014-01-28 MED ORDER — CEFAZOLIN SODIUM 1-5 GM-% IV SOLN: 1.0000 g | Freq: Three times a day (TID) | INTRAVENOUS | Status: DC

## 2014-01-28 MED ORDER — AMLODIPINE BESYLATE 5 MG PO TABS
5.0000 mg | ORAL_TABLET | Freq: Every day | ORAL | Status: DC
  Filled 2014-01-28: qty 1

## 2014-01-28 MED ORDER — PHENYLEPHRINE HCL 10 MG/ML IJ SOLN
10.0000 mg | INTRAMUSCULAR | Status: DC | PRN
  Administered 2014-01-28: 40 ug/min via INTRAVENOUS

## 2014-01-28 MED ORDER — EPHEDRINE SULFATE 50 MG/ML IJ SOLN
INTRAMUSCULAR | Status: DC | PRN
  Administered 2014-01-28 (×5): 10 mg via INTRAVENOUS

## 2014-01-28 MED ORDER — ROCURONIUM BROMIDE 50 MG/5ML IV SOLN
INTRAVENOUS | Status: AC
  Filled 2014-01-28: qty 1

## 2014-01-28 MED ORDER — PANTOPRAZOLE SODIUM 40 MG PO TBEC
40.0000 mg | DELAYED_RELEASE_TABLET | Freq: Every day | ORAL | Status: DC
  Filled 2014-01-28: qty 1

## 2014-01-28 MED ORDER — VENLAFAXINE HCL ER 150 MG PO CP24
300.0000 mg | ORAL_CAPSULE | Freq: Every day | ORAL | Status: DC
  Administered 2014-01-29: 300 mg via ORAL
  Filled 2014-01-28 (×3): qty 2

## 2014-01-28 MED ORDER — PROPOFOL 10 MG/ML IV BOLUS
INTRAVENOUS | Status: DC | PRN
  Administered 2014-01-28: 120 mg via INTRAVENOUS

## 2014-01-28 MED ORDER — CLONIDINE HCL 0.1 MG PO TABS
0.1000 mg | ORAL_TABLET | Freq: Three times a day (TID) | ORAL | Status: DC
  Administered 2014-01-28: 0.1 mg via ORAL
  Filled 2014-01-28 (×5): qty 1

## 2014-01-28 MED ORDER — LIDOCAINE HCL (CARDIAC) 20 MG/ML IV SOLN
INTRAVENOUS | Status: DC | PRN
  Administered 2014-01-28: 100 mg via INTRAVENOUS

## 2014-01-28 MED ORDER — SODIUM CHLORIDE 0.9 % IV SOLN: 250.0000 mL | INTRAVENOUS | Status: DC

## 2014-01-28 MED ORDER — MIDAZOLAM HCL 2 MG/2ML IJ SOLN
INTRAMUSCULAR | Status: DC | PRN
  Administered 2014-01-28: 2 mg via INTRAVENOUS

## 2014-01-28 MED ORDER — FENTANYL CITRATE 0.05 MG/ML IJ SOLN
INTRAMUSCULAR | Status: DC | PRN
  Administered 2014-01-28: 75 ug via INTRAVENOUS
  Administered 2014-01-28: 100 ug via INTRAVENOUS
  Administered 2014-01-28 (×3): 75 ug via INTRAVENOUS
  Administered 2014-01-28: 100 ug via INTRAVENOUS

## 2014-01-28 MED ORDER — FENTANYL 50 MCG/HR TD PT72: 50.0000 ug | MEDICATED_PATCH | TRANSDERMAL | Status: DC

## 2014-01-28 MED ORDER — ALBUTEROL SULFATE (2.5 MG/3ML) 0.083% IN NEBU
2.5000 mg | INHALATION_SOLUTION | RESPIRATORY_TRACT | Status: DC | PRN
Start: 2014-01-28 — End: 2014-01-29

## 2014-01-28 MED ORDER — BUPIVACAINE HCL (PF) 0.25 % IJ SOLN
INTRAMUSCULAR | Status: AC
  Filled 2014-01-28: qty 30

## 2014-01-28 MED ORDER — HEMOSTATIC AGENTS (NO CHARGE) OPTIME
TOPICAL | Status: DC | PRN
  Administered 2014-01-28: 1 via TOPICAL

## 2014-01-28 MED ORDER — SIMVASTATIN 10 MG PO TABS
10.0000 mg | ORAL_TABLET | Freq: Every day | ORAL | Status: DC
  Administered 2014-01-28: 10 mg via ORAL
  Filled 2014-01-28 (×2): qty 1

## 2014-01-28 MED ORDER — VALACYCLOVIR HCL 500 MG PO TABS
500.0000 mg | ORAL_TABLET | Freq: Every day | ORAL | Status: DC
  Filled 2014-01-28: qty 1

## 2014-01-28 MED ORDER — BUPIVACAINE LIPOSOME 1.3 % IJ SUSP
INTRAMUSCULAR | Status: DC | PRN
  Administered 2014-01-28: 20 mL

## 2014-01-28 MED ORDER — BISACODYL 10 MG RE SUPP: 10.0000 mg | Freq: Every day | RECTAL | Status: DC | PRN

## 2014-01-28 MED ORDER — FENTANYL CITRATE 0.05 MG/ML IJ SOLN
25.0000 ug | INTRAMUSCULAR | Status: DC | PRN
Start: 2014-01-28 — End: 2014-01-28

## 2014-01-28 MED ORDER — SODIUM CHLORIDE 0.9 % IJ SOLN: 3.0000 mL | INTRAMUSCULAR | Status: DC | PRN

## 2014-01-28 MED ORDER — MENTHOL 3 MG MT LOZG: 1.0000 | LOZENGE | OROMUCOSAL | Status: DC | PRN

## 2014-01-28 MED ORDER — HYDROCODONE-ACETAMINOPHEN 10-325 MG PO TABS
1.0000 | ORAL_TABLET | ORAL | Status: DC | PRN
  Administered 2014-01-28 – 2014-01-29 (×3): 2 via ORAL
  Administered 2014-01-29 (×2): 1 via ORAL
  Filled 2014-01-28: qty 2
  Filled 2014-01-28: qty 1
  Filled 2014-01-28: qty 2
  Filled 2014-01-28: qty 1
  Filled 2014-01-28: qty 2

## 2014-01-28 MED ORDER — MIDAZOLAM HCL 2 MG/2ML IJ SOLN
INTRAMUSCULAR | Status: AC
  Filled 2014-01-28: qty 2

## 2014-01-28 MED ORDER — FLEET ENEMA 7-19 GM/118ML RE ENEM: 1.0000 | ENEMA | Freq: Once | RECTAL | Status: AC | PRN

## 2014-01-28 MED ORDER — SODIUM CHLORIDE 0.9 % IJ SOLN
3.0000 mL | Freq: Two times a day (BID) | INTRAMUSCULAR | Status: DC
  Administered 2014-01-28: 3 mL via INTRAVENOUS

## 2014-01-28 MED ORDER — PROMETHAZINE HCL 25 MG/ML IJ SOLN: 6.2500 mg | INTRAMUSCULAR | Status: DC | PRN

## 2014-01-28 MED ORDER — KCL IN DEXTROSE-NACL 20-5-0.45 MEQ/L-%-% IV SOLN
INTRAVENOUS | Status: DC
  Administered 2014-01-29: via INTRAVENOUS
  Filled 2014-01-28 (×3): qty 1000

## 2014-01-28 SURGICAL SUPPLY — 58 items
AIRSTRIP 3X4 (GAUZE/BANDAGES/DRESSINGS) ×2 IMPLANT
BUR ROUND FLUTED 4 SOFT TCH (BURR) IMPLANT
BUR SABER RD CUTTING 3.0 (BURR) ×2 IMPLANT
CORDS BIPOLAR (ELECTRODE) ×2 IMPLANT
DERMABOND ADVANCED (GAUZE/BANDAGES/DRESSINGS) ×1
DERMABOND ADVANCED .7 DNX12 (GAUZE/BANDAGES/DRESSINGS) ×1 IMPLANT
DRAPE C-ARM 42X72 X-RAY (DRAPES) ×2 IMPLANT
DRAPE INCISE IOBAN 66X45 STRL (DRAPES) ×2 IMPLANT
DRAPE MICROSCOPE LEICA (MISCELLANEOUS) ×2 IMPLANT
DRAPE POUCH INSTRU U-SHP 10X18 (DRAPES) ×2 IMPLANT
DRAPE PROXIMA HALF (DRAPES) IMPLANT
DRAPE SURG 17X23 STRL (DRAPES) ×8 IMPLANT
DRSG MEPILEX BORDER 4X4 (GAUZE/BANDAGES/DRESSINGS) ×2 IMPLANT
DRSG MEPILEX BORDER 4X8 (GAUZE/BANDAGES/DRESSINGS) IMPLANT
DURAPREP 26ML APPLICATOR (WOUND CARE) ×2 IMPLANT
ELECT CAUTERY BLADE 6.4 (BLADE) ×2 IMPLANT
ELECT REM PT RETURN 9FT ADLT (ELECTROSURGICAL) ×2
ELECTRODE REM PT RTRN 9FT ADLT (ELECTROSURGICAL) ×1 IMPLANT
EVACUATOR 1/8 PVC DRAIN (DRAIN) IMPLANT
GLOVE BIOGEL PI IND STRL 6.5 (GLOVE) ×1 IMPLANT
GLOVE BIOGEL PI IND STRL 7.5 (GLOVE) ×1 IMPLANT
GLOVE BIOGEL PI INDICATOR 6.5 (GLOVE) ×1
GLOVE BIOGEL PI INDICATOR 7.5 (GLOVE) ×1
GLOVE ECLIPSE 7.0 STRL STRAW (GLOVE) ×2 IMPLANT
GLOVE ECLIPSE 8.5 STRL (GLOVE) ×2 IMPLANT
GLOVE SURG 8.5 LATEX PF (GLOVE) ×2 IMPLANT
GLOVE SURG SS PI 7.0 STRL IVOR (GLOVE) ×2 IMPLANT
GOWN STRL REUS W/ TWL LRG LVL3 (GOWN DISPOSABLE) ×2 IMPLANT
GOWN STRL REUS W/TWL 2XL LVL3 (GOWN DISPOSABLE) ×2 IMPLANT
GOWN STRL REUS W/TWL LRG LVL3 (GOWN DISPOSABLE) ×2
KIT BASIN OR (CUSTOM PROCEDURE TRAY) ×2 IMPLANT
KIT ROOM TURNOVER OR (KITS) ×2 IMPLANT
MANIFOLD NEPTUNE II (INSTRUMENTS) ×2 IMPLANT
NEEDLE 22X1 1/2 (OR ONLY) (NEEDLE) ×2 IMPLANT
NEEDLE SPNL 18GX3.5 QUINCKE PK (NEEDLE) ×4 IMPLANT
NS IRRIG 1000ML POUR BTL (IV SOLUTION) ×2 IMPLANT
PACK LAMINECTOMY ORTHO (CUSTOM PROCEDURE TRAY) ×2 IMPLANT
PAD ARMBOARD 7.5X6 YLW CONV (MISCELLANEOUS) ×4 IMPLANT
PATTIES SURGICAL .5 X.5 (GAUZE/BANDAGES/DRESSINGS) IMPLANT
PATTIES SURGICAL .75X.75 (GAUZE/BANDAGES/DRESSINGS) IMPLANT
PATTIES SURGICAL 1X1 (DISPOSABLE) IMPLANT
SPECIMEN JAR SMALL (MISCELLANEOUS) IMPLANT
SPONGE LAP 4X18 X RAY DECT (DISPOSABLE) IMPLANT
SPONGE SURGIFOAM ABS GEL 100 (HEMOSTASIS) ×2 IMPLANT
SUT VIC AB 0 CT1 27 (SUTURE)
SUT VIC AB 0 CT1 27XBRD ANBCTR (SUTURE) IMPLANT
SUT VIC AB 1 CT1 27 (SUTURE)
SUT VIC AB 1 CT1 27XBRD ANBCTR (SUTURE) IMPLANT
SUT VIC AB 2-0 CT1 27 (SUTURE) ×1
SUT VIC AB 2-0 CT1 TAPERPNT 27 (SUTURE) ×1 IMPLANT
SUT VICRYL 0 UR6 27IN ABS (SUTURE) ×2 IMPLANT
SUT VICRYL 4-0 PS2 18IN ABS (SUTURE) ×2 IMPLANT
SYR CONTROL 10ML LL (SYRINGE) ×2 IMPLANT
TOWEL OR 17X24 6PK STRL BLUE (TOWEL DISPOSABLE) ×2 IMPLANT
TOWEL OR 17X26 10 PK STRL BLUE (TOWEL DISPOSABLE) ×2 IMPLANT
TRAY FOLEY CATH 16FRSI W/METER (SET/KITS/TRAYS/PACK) IMPLANT
WATER STERILE IRR 1000ML POUR (IV SOLUTION) IMPLANT
YANKAUER SUCT BULB TIP NO VENT (SUCTIONS) ×2 IMPLANT

## 2014-01-28 NOTE — Progress Notes (Signed)
Urine drug screen from 01/07/2014 was consistent. 

## 2014-01-28 NOTE — Op Note (Addendum)
01/28/2014  1:36 PM  PATIENT:  Jill Phillips  58 y.o. female  MRN: [id number]  OPERATIVE REPORT  PRE-OPERATIVE DIAGNOSIS:  Right L1-L2 Herniated Nucleus Pulposus  POST-OPERATIVE DIAGNOSIS:  Right L1-L2 Herniated Nucleus Pulposus  PROCEDURE:  Procedure(s): Minimally Invasive Right  L1-2 Microdiscectomy OR Microdiscectomy    SURGEON:  [name], MD     ASSISTANT:  Phillips Hay, PA-C  (Present throughout the entire procedure and necessary for completion of procedure in a timely manner)     ANESTHESIA:  General,    COMPLICATIONS:  None.   PROCEDURE:The patient was met in the holding area, and the appropriate right Lumbar level L1-2 identified and marked with "x" and my initials.The patient was then transported to OR and was placed under general anesthesia without difficulty. The patient received appropriate preoperative antibiotic prophylaxis. The patient after intubation atraumatically was transferred to the operating room table, prone position, Wilson frame, sliding OR table. All pressure points were well padded. The arms in 90-90 well-padded at the elbows. Standard prep with CHG solution lower dorsal spine to the mid sacral segment. Draped in the usual manner clear Vi-Drape was used. Time-out procedure was called and correct. 2x 18-gauge spinal needle was then inserted at the expected L1-2 level. C-arm was draped sterilely to the field and used to identify the spinal needles positions. The lower needle was at the lower aspect of the lamina of L1. Skin superior to this was then infiltrated with Marcaine half percent with exparel 1.3% 1:1 total of 15 cc used. An incision approximately an inch inch and a half in length was then made through skin and subcutaneous layers in line with the right side of the expected midline just superior to the spinal needle entry point. An incision made into the right lumbosacral fascia approximately an inch in length .  Smallest dilator was then introduced  into the incision site and used to carefully form subperiosteal dissection of the paralumbar muscles off of the posterior lamina of the expected L1-2 level. Successive dilators were then carried up to the 11 mm size. The depth measured off of the dilators at about 40 mm and 40 mm retractors then placed on the scaffolding for the MIS equipment and guided over dilators down to and docking on the posterior aspect of the lamina at the expected L1-2 level. This was sterilely attached to the articulating arm and it's up right which had been attached the OR table sterilely. C-arm fluoroscopy was identified the dilators and the retractors at the appropriate level L1-2. The operating room microscope sterilely draped brought into the field. Under the operating room microscope, the L1-2 interspace carefully debrided the small amount of muscle attachment here and high-speed bur used to drill the medial aspect of the inferior articular process of L1 approximately 20%. A localization lateral C-arm view was obtained with Penfield 4 in the L1-2 facet. 2 mm Kerrison then used to enter the spinal canal over the superior aspect of the L2 lamina carefully using the Kerrison to debris the attachment as a curet. Foraminotomy was then performed over the right L2 nerve root. The medial 10% superior articular process of L2 and then resected using 2 mm Kerrison. This allowed for identification of the thecal sac. Penfield 4 was then used to carefully mobilize the thecal sac medially and the L2 nerve root identified within the lateral recess flattened over the posterior aspect of the protruded disc. Carefully the lateral aspect of the L2 nerve root was identified and  a Penfield 4 was used to mobilize the nerve medially such that the protruded disc was visible with microscope. Using a Penfield 4 for retraction and a 15 blade scalpel was used to incise the posterior longitudinal ligament within the lateral recess on the left side longitudinally.  Disc material was removed using micropituitary rongeurs and nerve hook nerve root and then more easily able to be mobilized medially and retracted using a Derricho retractor. Further foraminotomies was performed over the L2 nerve root the nerve root was noted to be X. without further compression. The nerve root able to be retracted along the medial aspect of the L2 pedicle and disc material found to be subligamentous at this level was further resected current pituitary rongeurs.  Ligamentum flavum was further debrided superiorly to the level L1-2 disc. Had a moderate amount of further resection of the L2  lamina inferiorly and mediallywas performed. With this then the disc space at L1-2 was easily visualized and entry into the disc at the sided disc herniation was possible using a Penfield 4 intraoperative Lateral radiograph was used to identify the L1-2 disc with the Penfield 4 In place just below the disc space. Micropituitary was used to further debride this material superficially from the posterior aspect of the intervertebral disc is posterior lateral aspect of the disc. Small amount of further disc material was found subligamentous extending laterally from the disc this was removed using micropituitary rongeurs into the right L2 neuroforamen additionally epstein  currettes were used to decompress the right L2 neuroforamen. Upbiting currettes were used to further remove reflected portions of the reflected portions of the medial L1-2 facet and portions of the superior portion of the L2 superior articular  Facet. Ligamentum flavum was debrided and lateral recess along the medial aspect L1-2 facet no further decompression was necessary. Ball tip nerve probe was then able to carefully palpate the neuroforamen for L1 and L2 finding these to be well decompressed. Bleeding was then controlled using thrombin-soaked Gelfoam small cottonoids. Small amount of bleeding within the soft tissue mass the laminotomy area was  controlled using bipolar electrocautery. Irrigation was carried out using copious amounts of irrigant solution. All Gelfoam were then removed. No significant active bleeding present at the time of removal. All instruments sponge counts were correct traction system was then carefully removed carefully rotating retractors with this withdrawal and only bipolar electrocautery of any small bleeders. The fascia and deeper structures infiltrated with with Marcaine with Exparel 1:1. Lumbodorsal fascia was then carefully approximated with interrupted 0 Vicryl sutures, UR 6 needle deep subcutaneous layers were approximated with interrupted 0 Vicryl sutures on UR 6 the appear subcutaneous layers approximated with interrupted 2-0 Vicryl sutures and the skin closed with a running subcutaneous stitch of 4-0 Vicryl. Dermabond was applied allowed to dry and then Mepilex bandage applied. Patient was then carefully returned to supine position on a stretcher, reactivated and extubated. He was then returned to recovery room in satisfactory condition. Phillips Hay PA-C perform the duties of assistant surgeon during this case. She was present from the beginning of the case to the end of the case assisting in transfer the patient from his stretcher to the OR table and back to the stretcher at the end of the case. Assisted in careful retraction and suction of the laminectomy site delicate neural structures operating under the operating room microscope. She performed closure of the incision from the fascia to the skin applying the dressing.     [name]  01/28/2014, 1:36 PM

## 2014-01-28 NOTE — Brief Op Note (Signed)
01/28/2014  1:31 PM  PATIENT:  Jill Phillips  58 y.o. female  PRE-OPERATIVE DIAGNOSIS:  Right L1-L2 Herniated Nucleus Pulposus  POST-OPERATIVE DIAGNOSIS:  Right L1-L2 Herniated Nucleus Pulposus  PROCEDURE:  Procedure(s): Minimally Invasive Right  L1-2 Microdiscectomy (N/A) OR MICROSCOPE  SURGEON:  Surgeon(s) and Role: [name], MD - Primary  PHYSICIAN ASSISTANT: Phillips Hay, PA-C  ANESTHESIA:   local and general, Dr. Conrad Cisco.  EBL:  Total I/O In: 2000 [I.V.:2000] Out: 75 [Blood:75]  BLOOD ADMINISTERED:none  DRAINS: none   LOCAL MEDICATIONS USED:  MARCAINE1/2% 1:1 exparel 1.3%  20 ml  SPECIMEN:  No Specimen  DISPOSITION OF SPECIMEN:  N/A  COUNTS:  YES  TOURNIQUET:  * No tourniquets in log *  DICTATION: .Dragon Dictation  PLAN OF CARE: Admit to inpatient   PATIENT DISPOSITION:  PACU - hemodynamically stable.   Delay start of Pharmacological VTE agent (>24hrs) due to surgical blood loss or risk of bleeding: yes

## 2014-01-28 NOTE — Anesthesia Preprocedure Evaluation (Addendum)
Anesthesia Evaluation  Patient identified by MRN, date of birth, ID band Patient awake    Reviewed: Allergy & Precautions, H&P , NPO status , Patient's Chart, lab work & pertinent test results  History of Anesthesia Complications (+) PONV  Airway Mallampati: II  Neck ROM: Full    Dental  (+) Edentulous Upper, Dental Advisory Given   Pulmonary sleep apnea and Continuous Positive Airway Pressure Ventilation , Current Smoker,  breath sounds clear to auscultation        Cardiovascular hypertension, + Valvular Problems/Murmurs Rhythm:Regular Rate:Normal     Neuro/Psych    GI/Hepatic GERD-  ,  Endo/Other  Hypothyroidism   Renal/GU      Musculoskeletal   Abdominal   Peds  Hematology   Anesthesia Other Findings   Reproductive/Obstetrics                         Anesthesia Physical Anesthesia Plan  ASA: III  Anesthesia Plan: General   Post-op Pain Management:    Induction: Intravenous  Airway Management Planned: Oral ETT  Additional Equipment:   Intra-op Plan:   Post-operative Plan: Extubation in OR  Informed Consent: I have reviewed the patients History and Physical, chart, labs and discussed the procedure including the risks, benefits and alternatives for the proposed anesthesia with the patient or authorized representative who has indicated his/her understanding and acceptance.   Dental advisory given  Plan Discussed with: CRNA and Surgeon  Anesthesia Plan Comments:         Anesthesia Quick Evaluation

## 2014-01-28 NOTE — Discharge Instructions (Signed)
    No lifting greater than 10 lbs. Avoid bending, stooping and twisting. Walk in house for first week them may start to get out slowly increasing distance up to one mile by 3 weeks post op. Keep incision dry for 3 days, may use tegaderm or similar water impervious dressing.  

## 2014-01-28 NOTE — Anesthesia Procedure Notes (Signed)
Procedure Name: Intubation Date/Time: 01/28/2014 11:40 AM Performed by: Marinda Elk A Pre-anesthesia Checklist: Patient identified, Timeout performed, Emergency Drugs available, Patient being monitored and Suction available Patient Re-evaluated:Patient Re-evaluated prior to inductionOxygen Delivery Method: Circle system utilized Preoxygenation: Pre-oxygenation with 100% oxygen Intubation Type: IV induction Ventilation: Mask ventilation without difficulty Laryngoscope Size: Mac and 3 Grade View: Grade I Tube type: Oral Tube size: 7.5 mm Number of attempts: 1 Airway Equipment and Method: Stylet Placement Confirmation: ETT inserted through vocal cords under direct vision,  breath sounds checked- equal and bilateral and positive ETCO2 Secured at: 19 cm Tube secured with: Tape Dental Injury: Teeth and Oropharynx as per pre-operative assessment

## 2014-01-28 NOTE — Interval H&P Note (Signed)
History and Physical Interval Note:  01/28/2014 11:19 AM  Jill Phillips  has presented today for surgery, with the diagnosis of Right L1-L2 Herniated Nucleus Pulposus  The various methods of treatment have been discussed with the patient and family. After consideration of risks, benefits and other options for treatment, the patient has consented to  Procedure(s): RIGHT L1-2 MICRODISCECTOMY using MIS (N/A) as a surgical intervention .  The patient's history has been reviewed, patient examined, no change in status, stable for surgery.  I have reviewed the patient's chart and labs.  Questions were answered to the patient's satisfaction.     [name]

## 2014-01-28 NOTE — Transfer of Care (Signed)
Immediate Anesthesia Transfer of Care Note  Patient: Jill Phillips  Procedure(s) Performed: Procedure(s): Minimally Invasive Right  L1-2 Microdiscectomy (N/A)  Patient Location: PACU  Anesthesia Type:General  Level of Consciousness: sedated  Airway & Oxygen Therapy: Patient Spontanous Breathing and Patient connected to nasal cannula oxygen  Post-op Assessment: Report given to PACU RN and Post -op Vital signs reviewed and stable  Post vital signs: Reviewed and stable  Complications: No apparent anesthesia complications

## 2014-01-29 NOTE — Plan of Care (Signed)
Problem: Consults Goal: Diagnosis - Spinal Surgery Outcome: Completed/Met Date Met:  01/29/14 Microdiscectomy

## 2014-01-29 NOTE — Discharge Summary (Signed)
  Discharge to home in stable condition. Final diagnosis herniated lumbar disc. Surgical procedure microdiscectomy lumbar spine. Prescriptions on the chart for pain medication. Followup in 2 weeks.

## 2014-01-31 ENCOUNTER — Encounter (HOSPITAL_COMMUNITY): Payer: Self-pay | Admitting: Specialist

## 2014-01-31 MED FILL — Thrombin For Soln 20000 Unit: CUTANEOUS | Qty: 1 | Status: AC

## 2014-01-31 NOTE — Anesthesia Postprocedure Evaluation (Signed)
  Anesthesia Post-op Note  Patient: Jill Phillips  Procedure(s) Performed: Procedure(s): Minimally Invasive Right  L1-2 Microdiscectomy (N/A)  Patient Location: PACU  Anesthesia Type:General  Level of Consciousness: awake and alert   Airway and Oxygen Therapy: Patient Spontanous Breathing  Post-op Pain: mild  Post-op Assessment: Post-op Vital signs reviewed  Post-op Vital Signs: stable  Last Vitals:  Filed Vitals:   01/29/14 0525  BP: 103/68  Pulse: 82  Temp: 36.6 C  Resp: 14    Complications: No apparent anesthesia complications

## 2014-02-04 ENCOUNTER — Other Ambulatory Visit: Payer: Self-pay | Admitting: Family Medicine

## 2014-02-04 ENCOUNTER — Ambulatory Visit: Payer: Medicare Other | Admitting: Registered Nurse

## 2014-02-18 ENCOUNTER — Other Ambulatory Visit: Payer: Self-pay | Admitting: Physical Medicine & Rehabilitation

## 2014-02-21 ENCOUNTER — Other Ambulatory Visit: Payer: Self-pay | Admitting: *Deleted

## 2014-02-21 MED ORDER — CLONIDINE HCL 0.1 MG PO TABS: 0.1000 mg | ORAL_TABLET | Freq: Three times a day (TID) | ORAL | Status: DC

## 2014-02-24 ENCOUNTER — Telehealth: Payer: Self-pay | Admitting: *Deleted

## 2014-02-24 NOTE — Telephone Encounter (Signed)
Jill Phillips called because she had her surgery on 02/10/14 and returns to see Dr [name] on July 9.  He wrote for her patches and gave her hydrocodone 1 q 4-6 hr prn # 60 max 6 per day.  She does not take more than three.  She says his office says he is uncomfortable prescribing these for her or larger disp.  I told her that it is our policy that after surgery the surgeon manages the pain meds until she is released from their care.  She asked that we fax something over to their office stating that.  I have done this.  She also thinks she may have hurt her back in the tub by slipping.  I told her she needs to call Dr Arvil Persons office and see if she can get in before 03/10/14 since she had surgery on her back and this needs to be addressed by Dr [name] in case their was injury to surgical area.

## 2014-02-25 NOTE — OR Nursing (Signed)
Addendum to scope page 

## 2014-02-28 ENCOUNTER — Institutional Professional Consult (permissible substitution): Payer: Medicare Other | Admitting: Neurology

## 2014-02-28 ENCOUNTER — Other Ambulatory Visit: Payer: Self-pay | Admitting: Internal Medicine

## 2014-02-28 MED ORDER — FEXOFENADINE-PSEUDOEPHED ER 60-120 MG PO TB12: 1.0000 | ORAL_TABLET | Freq: Two times a day (BID) | ORAL | Status: DC

## 2014-02-28 MED ORDER — OMEPRAZOLE 20 MG PO CPDR: 20.0000 mg | DELAYED_RELEASE_CAPSULE | Freq: Every day | ORAL | Status: DC

## 2014-02-28 MED ORDER — OMEPRAZOLE 20 MG PO CPDR: 20.0000 mg | DELAYED_RELEASE_CAPSULE | Freq: Two times a day (BID) | ORAL | Status: DC

## 2014-02-28 NOTE — Telephone Encounter (Signed)
Pt request omeprazole twice a day. Also Zyrtec is not helping and wants to try another med for allergies.

## 2014-03-02 ENCOUNTER — Encounter: Payer: Medicare Other | Attending: Physical Medicine and Rehabilitation | Admitting: Registered Nurse

## 2014-03-02 DIAGNOSIS — M67919 Unspecified disorder of synovium and tendon, unspecified shoulder: Secondary | ICD-10-CM | POA: Insufficient documentation

## 2014-03-02 DIAGNOSIS — M719 Bursopathy, unspecified: Secondary | ICD-10-CM | POA: Insufficient documentation

## 2014-03-02 DIAGNOSIS — IMO0002 Reserved for concepts with insufficient information to code with codable children: Secondary | ICD-10-CM | POA: Insufficient documentation

## 2014-03-02 DIAGNOSIS — Z79899 Other long term (current) drug therapy: Secondary | ICD-10-CM | POA: Insufficient documentation

## 2014-03-02 DIAGNOSIS — M1710 Unilateral primary osteoarthritis, unspecified knee: Secondary | ICD-10-CM | POA: Insufficient documentation

## 2014-03-02 DIAGNOSIS — G2581 Restless legs syndrome: Secondary | ICD-10-CM | POA: Insufficient documentation

## 2014-03-02 DIAGNOSIS — Z5181 Encounter for therapeutic drug level monitoring: Secondary | ICD-10-CM | POA: Insufficient documentation

## 2014-03-02 DIAGNOSIS — M961 Postlaminectomy syndrome, not elsewhere classified: Secondary | ICD-10-CM | POA: Insufficient documentation

## 2014-03-04 ENCOUNTER — Other Ambulatory Visit: Payer: Self-pay | Admitting: Family Medicine

## 2014-03-07 ENCOUNTER — Institutional Professional Consult (permissible substitution): Payer: Medicare Other | Admitting: Neurology

## 2014-03-07 ENCOUNTER — Other Ambulatory Visit: Payer: Self-pay

## 2014-03-07 ENCOUNTER — Telehealth: Payer: Self-pay | Admitting: Neurology

## 2014-03-07 MED ORDER — CLONAZEPAM 2 MG PO TABS: 4.0000 mg | ORAL_TABLET | Freq: Every day | ORAL | Status: DC

## 2014-03-07 NOTE — Telephone Encounter (Signed)
Received a pharmacy request to refill Clonazepam, refill called in at pharmacy.

## 2014-03-07 NOTE — Telephone Encounter (Signed)
Patient calling to state that she was told she needs to schedule an appointment with Dr. Rexene Alberts as soon as possible, states that she had one but then had to cancel due to transportation. Please return call to patient and advise.

## 2014-03-08 NOTE — Telephone Encounter (Signed)
Called pt to reschedule appt with Dr. Rexene Alberts. Pt has an appt on 03/11/14 with Dr. Rexene Alberts. I advised the pt that if she has any other problems, questions or concerns to call the office. Pt verbalized understanding.

## 2014-03-10 ENCOUNTER — Telehealth: Payer: Self-pay | Admitting: *Deleted

## 2014-03-10 MED ORDER — CETIRIZINE-PSEUDOEPHEDRINE ER 5-120 MG PO TB12: 1.0000 | ORAL_TABLET | Freq: Two times a day (BID) | ORAL | Status: DC

## 2014-03-10 NOTE — Telephone Encounter (Signed)
Fax from [facility] is not covered by [employer] or Medicaid. Generic Zyretec D 12 hour tab are covered. Thanks

## 2014-03-10 NOTE — Telephone Encounter (Signed)
Per 02/28/14 note, Zyrtec was not working. I sent in in anyway. If needs pre-auth or another drug, will need appt to address.

## 2014-03-11 ENCOUNTER — Ambulatory Visit (INDEPENDENT_AMBULATORY_CARE_PROVIDER_SITE_OTHER): Payer: Medicare Other | Admitting: Neurology

## 2014-03-11 ENCOUNTER — Encounter: Payer: Self-pay | Admitting: Neurology

## 2014-03-11 VITALS — BP 173/99 | HR 84 | Temp 97.8°F | Ht 64.0 in | Wt 157.0 lb

## 2014-03-11 DIAGNOSIS — G4731 Primary central sleep apnea: Secondary | ICD-10-CM

## 2014-03-11 DIAGNOSIS — Z9889 Other specified postprocedural states: Secondary | ICD-10-CM

## 2014-03-11 DIAGNOSIS — G4733 Obstructive sleep apnea (adult) (pediatric): Secondary | ICD-10-CM

## 2014-03-11 DIAGNOSIS — G4730 Sleep apnea, unspecified: Secondary | ICD-10-CM

## 2014-03-11 DIAGNOSIS — R0902 Hypoxemia: Secondary | ICD-10-CM

## 2014-03-11 DIAGNOSIS — F17200 Nicotine dependence, unspecified, uncomplicated: Secondary | ICD-10-CM

## 2014-03-11 DIAGNOSIS — G4736 Sleep related hypoventilation in conditions classified elsewhere: Secondary | ICD-10-CM

## 2014-03-11 NOTE — Progress Notes (Signed)
Subjective:    Patient ID: Jill Phillips is a 58 y.o. female.  HPI    Interim history:   Jill Phillips is 58 year old right-handed woman with a complex medical history of scoliosis, status post multiple back surgeries, right hip and right knee replacement surgeries, chronic pain on narcotic pain medication, RLS, reflux disease, thyroid disease, hyperlipidemia, and hypertension, who presents for followup consultation of her complex sleep apnea after a recent PAP titration sleep study. She is unaccompanied today. I last saw her on 08/27/2013, at which time I asked her to return for a full night sleep study with titration of positive airway pressure as we did not have resolution of her sleep disorder breathing, which was primarily central sleep apnea. She also reported restless leg symptoms and discomfort in her legs. She has been on high dose of Effexor. I advised her to stop smoking. She had significant hypoxemia and I felt she needed supplemental oxygen. Since then she had a sleep study on 11/05/2013 with full night positive airway pressure titration. I talked her about her sleep test results: Sleep efficiency was 89.1% with a latency to sleep of 11 minutes and wake after sleep onset of 41 minutes with mild sleep fragmentation noted. She had spontaneous arousals primarily which resulted in a mildly elevated arousal index of 17.7 per hour. She had an increased percentage of stage II sleep, absence of slow-wave sleep and a near normal percentage of REM sleep with a prolonged REM latency. He had mild PLMS with 19.8 per hour and resulting and 5.5 arousals per hour. There were no significant EKG changes. She had a total of one obstructive apnea and 2 obstructive, 3 mixed in 4 central hypopneas for the night. She had been on oxygen and was started on 2 L per minute. Baseline oxygen saturation was 91%, nadir was 78% which appeared to be an error. Upon my evaluation, her true nadir was 87%. Time below 90% saturation  was one hour and 17 minutes, time below 88% saturation was 2 minutes and 22 seconds. She was titrated on BiPAP with a pressure of eight over four and titrated to 9/5 cm and 10/6 cm. She appeared to tolerate the pressure of 9/5 better. She was changed to BiPAP ST towards the end of the study for trial. Final settings were BiPAP of 9/5 with a rate of 12 per minute and she had a residual AHI of 0.9 per hour on those settings. Based on those test results I prescribed BiPAP for her. I reviewed the patient's BiPAP compliance data from 11/16/2013 to 12/15/2013, which is a total of 30 days, during which time the patient used BiPAP on 21 days. The average usage for all days was 3 hours and 2 minutes. The percent used days greater than 4 hours was 37 %, indicating poor compliance. The residual AHI was 5.9 per hour, indicating a fairly appropriate treatment pressure of 9/5 cwp. Air leak from the mask was very acceptable at 12.3 L per minute at the 95th percentile.  Today, I reviewed her compliance data from 02/09/2014 2 03/10/2014 which is a total of 30 days during which time she used her BiPAP only 18 months. Percent used days greater than 4 hours was 3%, indicating poor compliance, average usage is 54 minutes, AHI 3.7 on the current settings of 9/5 spontaneous and leak was low. Today, she reports, that she uses her machine, because it is doing her "no good". She is still smoking. On 01/28/14 she had minimally invasive  right L1-2 microdiscectomy OR microdiscectomy. She still has back pain. She feels that she may have pulled something after surgery. She sees Dr. [name] soon. She is on fentanyl patch, she is on hydrocodone.  I saw her on 06/04/2013, and which time we discussed her baseline sleep study results and I suggested that she return for full night CPAP titration to treat her sleep disordered breathing. Her baseline sleep study from 05/21/2013 showed a sleep efficiency of 88.6% with a normal sleep latency. Moderate  sleep fragmentation was noted. She had an increased percentage of light stage sleep, absence of slow-wave sleep, and a decreased percentage of dream sleep with a normal REM latency. She had moderate to loud snoring. She had an AHI of 15.7 of which 44% of all respiratory events were central in nature. This was likely in part due to narcotic pain medication. Her baseline oxygen saturation was only 89% with a nadir of 82%.  Since then, she had a CPAP titration study on 06/28/2013 and I discussed her test results with her in detail today: Her sleep efficiency was 90.1%. She fell asleep quickly. Wake after sleep onset was 40.5 minutes with mild to moderate sleep fragmentation noted. This was a difficult study to acquire and 2 interpreted because the patient had ongoing central respiratory events throughout the study despite trying different treatment modalities. She had ongoing hypoxemia and was given supplemental oxygen. She had no significant periodic leg movements. She had mostly central respiratory events. Her baseline oxygen saturation was 87%, her nadir was 77% during REM sleep period time below 88% saturation was 4 hours 42 minutes and 55 seconds. She was initiated on CPAP at a pressure of 5 cm and titrated to 9 cm, then switch to standard BiPAP, then to BiPAP ST with a rate of 12 and supplemental oxygen was added later at 1 L then titrated to 2 L. She was even tried on ASV but did not tolerated and eventually was changed back to CPAP of 5, and 6 cm. All in all, she did not have resolution of her sleep disordered breathing but did fairly on CPAP of 6 cm with a PR of 1 and addition of supplemental oxygen at 2 L. Based on that I empirically prescribed CPAP for her with those settings. She called back to report that she was not doing well on CPAP. She also felt very sleepy during the day. She was advised to come in for this appointment.  I reviewed her compliance data from her machine today: 07/19/2013 through  08/26/2013 which is a total of 39 days during which time she used CPAP every night except for 3 nights. Her percent used days greater than 4 hours was only 46%. Residual AHI is 5.1 per hour and her leak was at times quite high with the 95th percentile at 31.7 L per minute her average usage for all days was 3 hours and 20 minutes. Her CPAP pressure is at 6 with no CPR. She uses supplemental oxygen. She continues to smoke.  I originally met her at the request of her pain Dr. on 12/03/2012, at which time I suggested a sleep study. Based on her central respiratory events I did not think she would be a great candidate for CPAP therapy but would most likely have to try and fail CPAP before we could attempt bilevel PAP treatment.   Her Past Medical History Is Significant For: Past Medical History  Diagnosis Date  . Postlaminectomy syndrome, thoracic region   . Osteoarthrosis,  unspecified whether generalized or localized, lower leg   . Dysthymic disorder   . Calcifying tendinitis of shoulder   . Pain in joint, upper arm   . Chronic pain syndrome   . Lumbago   . Primary localized osteoarthrosis, lower leg   . Hypertension   . Hyperlipidemia   . GERD (gastroesophageal reflux disease)   . Thyroid disease   . Restless leg syndrome   . PONV (postoperative nausea and vomiting)   . Heart murmur   . Sleep apnea     s/p surgery- last sleep study 2011- doesnt use oxygen or machine at night as instructed  . History of blood transfusion 1980  . Hypothyroidism   . Active smoker     Her Past Surgical History Is Significant For: Past Surgical History  Procedure Laterality Date  . Appendectomy    . Abdominal hysterectomy    . Tubal ligation    . Spine surgery      thoracic x 1,  lumbar x 15  . Right hip replacement    . Knee surgeries r knee      arthroscopy- right  . Hammer toe surgery    . Sleep apnea surgery    . Cholecystectomy    . Joint replacement    . Total knee arthroplasty  05/29/2012     Procedure: TOTAL KNEE ARTHROPLASTY;  Surgeon: Mcarthur Rossetti, MD;  Location: [facility];  Service: Orthopedics;  Laterality: Right;  Right Total Knee Arthroplasty  . Back surgery      16 back surgeries  . Lumbar laminectomy/decompression microdiscectomy N/A 01/28/2014    Procedure: Minimally Invasive Right  L1-2 Microdiscectomy;  Surgeon: [name], MD;  Location: [facility];  Service: Orthopedics;  Laterality: N/A;    Her Family History Is Significant For: Family History  Problem Relation Age of Onset  . Kidney disease Mother   . Heart disease Father   . Anuerysm Brother 29    brain  . Heart disease Brother   . Heart disease Sister 10    s/p CABG  . Hypertension Sister     Her Social History Is Significant For: History   Social History  . Marital Status: Divorced    Spouse Name: n/a    Number of Children: 2  . Years of Education: 12+   Occupational History  . disability     back surgeries   Social History Main Topics  . Smoking status: Current Every Day Smoker -- 2.00 packs/day for 45 years    Types: Cigarettes  . Smokeless tobacco: Never Used     Comment: SMOKES 2PPD/ NOT READY TO QUIT  . Alcohol Use: No  . Drug Use: No  . Sexual Activity: Not Currently    Partners: Male   Other Topics Concern  . None   Social History Narrative   Lives alone.  One daughter is local, but is getting ready to move to [place], where her children live with their father.  The other daughter lives near [place], [place].    Her Allergies Are:  Allergies  Allergen Reactions  . Amoxicillin Other (See Comments)    REACTION: Oral yeast infection  . Chlorzoxazone Other (See Comments)    REACTION: headache  . Codeine Other (See Comments)    REACTION: headache  . Darvocet [Propoxyphene N-Acetaminophen] Itching  . Dilaudid [Hydromorphone Hcl] Itching  . Flagyl [Metronidazole] Diarrhea  . Keflex [Cephalexin] Other (See Comments)    REACTION: unknown  . Morphine And Related Itching  .  Nitrofurantoin Monohyd Macro Hives  . Oxycodone-Acetaminophen Itching  . Percocet [Oxycodone-Acetaminophen] Itching  . Sulfa Antibiotics Other (See Comments)    REACTION: Yeast infection in mouth  :   Her Current Medications Are:  :  Review of Systems:  Out of a complete 14 point review of systems, all are reviewed and negative with the exception of these symptoms as listed below:   Review of Systems  Constitutional: Positive for activity change and appetite change.  HENT: Positive for rhinorrhea.   Eyes: Negative.   Respiratory: Positive for cough, shortness of breath and wheezing.   Cardiovascular:       Murmur  Gastrointestinal: Positive for constipation.  Endocrine: Negative.   Genitourinary: Negative.   Musculoskeletal: Positive for back pain and gait problem.  Skin: Negative.   Allergic/Immunologic: Negative.   Neurological: Negative.   Hematological: Negative.   Psychiatric/Behavioral: Positive for sleep disturbance (restless leg, eds, snoring, sleep talking).    Objective:  Neurologic Exam  Physical Exam Physical Examination:   Filed Vitals:   03/11/14 0907  BP: 173/99  Pulse: 84  Temp: 97.8 F (36.6 C)    General Examination: The patient is a very pleasant 58 y.o. female in no acute distress. She is mildly anxious appearing. She is tearful today.  HEENT: Normocephalic, atraumatic, pupils are equal, round and reactive to light and accommodation. Extraocular tracking is good without nystagmus noted. Normal smooth pursuit is noted. Hearing is grossly intact. Face is symmetric with normal facial animation and normal facial sensation. Speech shows no dysarthria. There is no hypophonia. There is no lip, neck or jaw tremor. Neck is supple with full range of motion. There are no carotid bruits on auscultation.   Chest: is clear to auscultation without wheezing, rhonchi or crackles noted.  Heart: sounds are regular and normal without murmurs, rubs or gallops noted.    Abdomen: is soft, non-tender and non-distended with normal bowel sounds appreciated on auscultation.  Extremities: There is no pitting edema in the distal lower extremities bilaterally.   Skin: is warm and dry with no trophic changes noted.  Musculoskeletal: exam reveals: She has an abnormal posture or with significant kyphosis noted in the lower lumbar region and scoliosis, she leans to the right as well.  Neurologically:  Mental status: The patient is awake, alert and oriented in all 4 spheres. Her memory, attention, language and knowledge are appropriate. There is no aphasia, agnosia, apraxia or anomia. Speech is clear with normal prosody and enunciation. Thought process is linear. Mood is congruent and affect is normal.   Cranial nerves are as described above under HEENT exam. In addition, shoulder shrug is normal with equal shoulder height noted. Motor exam: Normal bulk, strength and tone is noted. There is no drift, tremor or rebound. Romberg is negative. Reflexes are 2+ throughout. Fine motor skills are intact. Cerebellar testing shows no dysmetria or intention tremor. Sensory exam is intact to light touch, pinprick, vibration, temperature sense in the upper and lower extremities.  Gait, station and balance: abnormal posture with a stoop and inability to straighten up completely. She leans to the right, rather, her upper body is tilted to the right. She walks with difficulty and no limp today. She cannot do tandem walk.  Assessment and Plan:    In summary, Jill Phillips is a very pleasant 58 year old female with a complicated underlying medical history who has a history of excessive daytime somnolence and a recent sleep study confirming complex sleep apnea with a significant  central component. She since then had a CPAP titration study during which she was tried on CPAP, standard BiPAP, BiPAP ST, and ASV. She did not have resolution of her sleep disordered breathing on any modality but  seemed to tolerate CPAP at low pressure with supplemental oxygen. On this setting she still had residual sleep ap, central events. I brought her back for a full night titration study and she did reasonably well on BiPAP with oxygen at 2 L. Unfortunately she has not been compliant with treatment claiming that she is not feeling improved in that she is still sleepy during the day and the machine is doing her "no good". Unfortunately, we have made no significant progress even though I felt that she had done reasonably well on BiPAP. She is just not compliant and I do not know how that can her to use her machine. I explained to her that her situation is complicated. She is tired during the day because of medication effect as well. Unfortunately she does not even applied the BiPAP each night and I fear that she is going to lose coverage for the machine. I am at a loss here and explained to her that we are probably going around in circles. I would like for her to stop smoking and she says that she has the patches but has started using them. She has evidence of hypoxemia but it was improved with BiPAP along with 2 L of oxygen bleed in. She may not qualify for just oxygen treatment. To help this patient I would like to get an opinion from pulmonary sleep or pulmonology in general. Maybe there is an underlying lung disease that can be treated. Maybe there is some other help she can get for smoking cessation. I am desperate for help and I do not know how to help this unfortunate lady. She is advised that I would be happy to see her back once we have input from pulmonology. She was in agreement. In the interim, I have advised her strongly to stop smoking and to use her BiPAP each night for his long she can.

## 2014-03-11 NOTE — Patient Instructions (Signed)
I will request help from pulmonology.  Pls use your bipap regularly each night and any time you sleep. Please stop smoking.

## 2014-03-14 ENCOUNTER — Telehealth: Payer: Self-pay | Admitting: Neurology

## 2014-03-14 NOTE — Telephone Encounter (Signed)
Called pt and pt stated that she wanted to know if Dr. Rexene Alberts could write a letter stating that she needs help with her CPAP machine for her grandson could come an stay with her, pt states that she lives in [place] housing, so she needs a statement stating that she needs help. Please advise

## 2014-03-18 ENCOUNTER — Other Ambulatory Visit: Payer: Self-pay | Admitting: Specialist

## 2014-03-18 DIAGNOSIS — M545 Low back pain, unspecified: Secondary | ICD-10-CM

## 2014-03-19 ENCOUNTER — Other Ambulatory Visit: Payer: Self-pay | Admitting: Physical Medicine & Rehabilitation

## 2014-03-21 NOTE — Telephone Encounter (Signed)
I am a little at a loss here. How is the grandson going to help with her CPAP? Please find out. She is supposed to wear the CPAP every night, and has not been using it. How old is the Willey and how would he be of help?

## 2014-03-21 NOTE — Telephone Encounter (Signed)
Pt hadn't heard back regarding letter.  Please call and advise

## 2014-03-21 NOTE — Telephone Encounter (Signed)
Please advise previous note. Thanks  °

## 2014-03-22 NOTE — Telephone Encounter (Signed)
Pt called back to inform Dr. Rexene Alberts that her grandson has been helping with the CPAP, when pt falls asleep, grandson makes sure that the pt is wearing the CPAP and the grandson is 59 yrs old. Pt states that she needs a letter so the grandson will be able to stay with her to help her out. Please advise

## 2014-03-22 NOTE — Telephone Encounter (Signed)
Called pt and left message informing her per Dr. Rexene Alberts, that we needed more information about how the grandson is suppose to help with the CPAP and how old is the grandson and to please give our office a call back.

## 2014-03-23 NOTE — Telephone Encounter (Signed)
Richardson Landry: please see below and put in a letter format, thx

## 2014-03-24 ENCOUNTER — Telehealth: Payer: Self-pay

## 2014-03-24 NOTE — Telephone Encounter (Signed)
Patient called requesting refill on her patches.  She is still under Dr Arvil Persons care but he will not refill the patches.  Patient would like to know if she can come pick them up.

## 2014-03-25 ENCOUNTER — Ambulatory Visit
Admission: RE | Admit: 2014-03-25 | Discharge: 2014-03-25 | Disposition: A | Discharge status: Home / Self Care | Payer: Medicaid Other | Source: Ambulatory Visit | Attending: Specialist | Admitting: Specialist

## 2014-03-25 ENCOUNTER — Encounter: Payer: Self-pay | Admitting: *Deleted

## 2014-03-25 ENCOUNTER — Telehealth: Payer: Self-pay | Admitting: *Deleted

## 2014-03-25 DIAGNOSIS — M545 Low back pain, unspecified: Secondary | ICD-10-CM

## 2014-03-25 MED ORDER — FENTANYL 50 MCG/HR TD PT72: 50.0000 ug | MEDICATED_PATCH | TRANSDERMAL | Status: DC

## 2014-03-25 MED ORDER — GADOBENATE DIMEGLUMINE 529 MG/ML IV SOLN
14.0000 mL | Freq: Once | INTRAVENOUS | Status: AC | PRN
  Administered 2014-03-25: 14 mL via INTRAVENOUS

## 2014-03-25 NOTE — Telephone Encounter (Signed)
Patient calling back to let Richardson Landry know that her  grandson's name is Melburn Popper

## 2014-03-25 NOTE — Telephone Encounter (Signed)
i have written rx

## 2014-03-25 NOTE — Telephone Encounter (Signed)
Contacted patient to inform her that Fentanyl RX will be ready for pickup after lunch time.

## 2014-03-25 NOTE — Telephone Encounter (Signed)
Pt's information was given to Steve, CMA, Dr. Athar's assistant. °

## 2014-03-28 ENCOUNTER — Telehealth: Payer: Self-pay

## 2014-03-28 MED ORDER — FENTANYL 50 MCG/HR TD PT72: 50.0000 ug | MEDICATED_PATCH | TRANSDERMAL | Status: DC

## 2014-03-28 NOTE — Telephone Encounter (Signed)
No it's the ridiculous default quantity Epic reverts to. New rx printed

## 2014-03-28 NOTE — Telephone Encounter (Signed)
Patient states the Fentanyl RX her daughter picked up on 7/24 was for #5. Is this the correction quantity?

## 2014-03-28 NOTE — Telephone Encounter (Signed)
Attempted to contact patient to inform her that Fentanyl RX #5 is ready for pickup.

## 2014-04-06 ENCOUNTER — Other Ambulatory Visit: Payer: Self-pay | Admitting: Family Medicine

## 2014-04-07 NOTE — Telephone Encounter (Signed)
Noted  

## 2014-04-11 ENCOUNTER — Telehealth: Payer: Self-pay

## 2014-04-11 NOTE — Telephone Encounter (Signed)
Patient is requesting refills on Hydrocodone and Fentanyl. She is still under Dr Arvil Persons care but he will not refill her pain medications. Patient is willing to come in for a appt. Please advise

## 2014-04-15 ENCOUNTER — Telehealth: Payer: Self-pay | Admitting: Neurology

## 2014-04-15 DIAGNOSIS — G4733 Obstructive sleep apnea (adult) (pediatric): Secondary | ICD-10-CM

## 2014-04-15 DIAGNOSIS — G4731 Primary central sleep apnea: Secondary | ICD-10-CM

## 2014-04-15 NOTE — Telephone Encounter (Signed)
Called pt back and pt stated that she did not need the letter anymore, but wanted to know if Dr. Rexene Phillips could change her CPAP mask, because [facility] told her that Dr. Rexene Phillips would have to change the pressure of the CPAP. Please advise

## 2014-04-15 NOTE — Telephone Encounter (Signed)
Jill Phillips from [facility] returning call to [facility], please call and advise.

## 2014-04-15 NOTE — Telephone Encounter (Signed)
Patient returning Cathy's call from 04/14/14.

## 2014-04-15 NOTE — Addendum Note (Signed)
Addended by: Star Age on: 04/15/2014 02:11 PM   Modules accepted: Orders

## 2014-04-16 ENCOUNTER — Other Ambulatory Visit: Payer: Self-pay | Admitting: *Deleted

## 2014-04-16 NOTE — Telephone Encounter (Signed)
Received fax requesting refill on Norvasc.   Patient has not been seen since 02/17/2013.  Refill denied.   Faxed pharmacy to make aware.

## 2014-04-18 MED ORDER — FENTANYL 50 MCG/HR TD PT72: 50.0000 ug | MEDICATED_PATCH | TRANSDERMAL | Status: DC

## 2014-04-18 MED ORDER — HYDROCODONE-ACETAMINOPHEN 10-325 MG PO TABS: 1.0000 | ORAL_TABLET | ORAL | Status: DC | PRN

## 2014-04-18 NOTE — Telephone Encounter (Signed)
Can RF this time----further rx'es will require OV

## 2014-04-18 NOTE — Telephone Encounter (Signed)
Contacted patient to inform her that her RX is ready for pickup. Patient is aware she must have a OV for any further refills. Patient schedule a appt for 9/25 with Dr. Naaman Plummer.

## 2014-04-27 ENCOUNTER — Telehealth: Payer: Self-pay

## 2014-04-27 NOTE — Telephone Encounter (Signed)
Patient is requesting a refill on Hydrocodone. Last fill date was 8/17. Contacted patient to inform her the RX is for a 30 day supply.

## 2014-04-28 ENCOUNTER — Other Ambulatory Visit: Payer: Self-pay | Admitting: Internal Medicine

## 2014-04-28 ENCOUNTER — Other Ambulatory Visit: Payer: Self-pay | Admitting: Physical Medicine & Rehabilitation

## 2014-05-02 ENCOUNTER — Encounter
Payer: PRIVATE HEALTH INSURANCE | Attending: Physical Medicine and Rehabilitation | Admitting: Physical Medicine & Rehabilitation

## 2014-05-02 ENCOUNTER — Encounter: Payer: Self-pay | Admitting: Physical Medicine & Rehabilitation

## 2014-05-02 VITALS — BP 127/84 | HR 101 | Resp 14 | Ht 63.0 in | Wt 158.0 lb

## 2014-05-02 DIAGNOSIS — F17200 Nicotine dependence, unspecified, uncomplicated: Secondary | ICD-10-CM

## 2014-05-02 DIAGNOSIS — M67919 Unspecified disorder of synovium and tendon, unspecified shoulder: Secondary | ICD-10-CM

## 2014-05-02 DIAGNOSIS — M1710 Unilateral primary osteoarthritis, unspecified knee: Secondary | ICD-10-CM | POA: Diagnosis present

## 2014-05-02 DIAGNOSIS — IMO0002 Reserved for concepts with insufficient information to code with codable children: Secondary | ICD-10-CM | POA: Insufficient documentation

## 2014-05-02 DIAGNOSIS — M2170 Unequal limb length (acquired), unspecified site: Secondary | ICD-10-CM

## 2014-05-02 DIAGNOSIS — F32A Depression, unspecified: Secondary | ICD-10-CM

## 2014-05-02 DIAGNOSIS — Z79899 Other long term (current) drug therapy: Secondary | ICD-10-CM | POA: Insufficient documentation

## 2014-05-02 DIAGNOSIS — Z5181 Encounter for therapeutic drug level monitoring: Secondary | ICD-10-CM | POA: Diagnosis present

## 2014-05-02 DIAGNOSIS — M719 Bursopathy, unspecified: Secondary | ICD-10-CM | POA: Insufficient documentation

## 2014-05-02 DIAGNOSIS — F329 Major depressive disorder, single episode, unspecified: Secondary | ICD-10-CM

## 2014-05-02 DIAGNOSIS — G2581 Restless legs syndrome: Secondary | ICD-10-CM | POA: Diagnosis present

## 2014-05-02 DIAGNOSIS — F3289 Other specified depressive episodes: Secondary | ICD-10-CM

## 2014-05-02 DIAGNOSIS — M5126 Other intervertebral disc displacement, lumbar region: Secondary | ICD-10-CM

## 2014-05-02 DIAGNOSIS — Z720 Tobacco use: Secondary | ICD-10-CM

## 2014-05-02 DIAGNOSIS — M961 Postlaminectomy syndrome, not elsewhere classified: Secondary | ICD-10-CM | POA: Insufficient documentation

## 2014-05-02 DIAGNOSIS — M1711 Unilateral primary osteoarthritis, right knee: Secondary | ICD-10-CM

## 2014-05-02 DIAGNOSIS — M75101 Unspecified rotator cuff tear or rupture of right shoulder, not specified as traumatic: Secondary | ICD-10-CM

## 2014-05-02 MED ORDER — HYDROCODONE-ACETAMINOPHEN 10-325 MG PO TABS: 1.0000 | ORAL_TABLET | Freq: Four times a day (QID) | ORAL | Status: DC | PRN

## 2014-05-02 MED ORDER — FENTANYL 50 MCG/HR TD PT72: 50.0000 ug | MEDICATED_PATCH | TRANSDERMAL | Status: DC

## 2014-05-02 NOTE — Patient Instructions (Signed)
PLEASE WORK ON GETTING YOUR NEW CPAP (AUTO-PAP) UNIT. IF YOU STILL HAVE PROBLEMS, GIVE [place] A CALL.  CUT BACK YOUR SMOKING, PARTICULARLY AFTER DINNER (IT'S AFFECTING YOUR SLEEP!)  WORK ON GOOD POSTURE AND REGULAR STRETCHING AND EXERCISE EVERY DAY!!!!

## 2014-05-02 NOTE — Progress Notes (Signed)
Subjective:    Patient ID: Jill Phillips, female    DOB: 1956/08/01, 58 y.o.   MRN: [id number]  HPI  Jill Phillips is back regarding her chronic pain. She had back surgery on May, 19th---it was a laminectomy and diskectomy. She feels that her pain has been better to a certain extent. She still has muscle spasms and pain when she stands or walks for a period of time. She slipped in the bathroom about 2 months ago. A repeat MRI showed no real changes, surrounding spondylosis.  Her sleep continues to be an issue. She falls asleep but wakes up in about two hours. Her pulmonologist has apparently rx'ed her an auto-pap unit, but she has not received it despite numerous correspondences with provider and vendor.   She continues to smoke about 2 packs of cigarettes per day. She smokes after dinner.   Her legs bother her at night as well. Left knee acts up after she's been standing for awhile.       Pain Inventory Average Pain 5 Pain Right Now 5 My pain is aching and spasms  In the last 24 hours, has pain interfered with the following? General activity 4 Relation with others 7 Enjoyment of life 3 What TIME of day is your pain at its worst? daytime,night Sleep (in general) Poor  Pain is worse with: walking, bending, sitting, standing and some activites Pain improves with: rest and medication Relief from Meds: 7  Mobility walk without assistance how many minutes can you walk? 20 ability to climb steps?  yes transfers alone Do you have any goals in this area?  no  Function Do you have any goals in this area?  no  Neuro/Psych bladder control problems  Prior Studies Any changes since last visit?  yes x-rays CT/MRI  Physicians involved in your care Any changes since last visit?  no   Family History  Problem Relation Age of Onset  . Kidney disease Mother   . Heart disease Father   . Anuerysm Brother 29    brain  . Heart disease Brother   . Heart disease Sister 62    s/p CABG   . Hypertension Sister    History   Social History  . Marital Status: Divorced    Spouse Name: n/a    Number of Children: 2  . Years of Education: 12+   Occupational History  . disability     back surgeries   Social History Main Topics  . Smoking status: Current Every Day Smoker -- 2.00 packs/day for 45 years    Types: Cigarettes  . Smokeless tobacco: Never Used     Comment: SMOKES 2PPD/ NOT READY TO QUIT  . Alcohol Use: No  . Drug Use: No  . Sexual Activity: Not Currently    Partners: Male   Other Topics Concern  . None   Social History Narrative   Lives alone.  One daughter is local, but is getting ready to move to [place], where her children live with their father.  The other daughter lives near [place], [place].   Past Surgical History  Procedure Laterality Date  . Appendectomy    . Abdominal hysterectomy    . Tubal ligation    . Spine surgery      thoracic x 1,  lumbar x 15  . Right hip replacement    . Knee surgeries r knee      arthroscopy- right  . Hammer toe surgery    . Sleep apnea  surgery    . Cholecystectomy    . Joint replacement    . Total knee arthroplasty  05/29/2012    Procedure: TOTAL KNEE ARTHROPLASTY;  Surgeon: Mcarthur Rossetti, MD;  Location: [facility];  Service: Orthopedics;  Laterality: Right;  Right Total Knee Arthroplasty  . Back surgery      16 back surgeries  . Lumbar laminectomy/decompression microdiscectomy N/A 01/28/2014    Procedure: Minimally Invasive Right  L1-2 Microdiscectomy;  Surgeon: [name], MD;  Location: [place];  Service: Orthopedics;  Laterality: N/A;   Past Medical History  Diagnosis Date  . Postlaminectomy syndrome, thoracic region   . Osteoarthrosis, unspecified whether generalized or localized, lower leg   . Dysthymic disorder   . Calcifying tendinitis of shoulder   . Pain in joint, upper arm   . Chronic pain syndrome   . Lumbago   . Primary localized osteoarthrosis, lower leg   . Hypertension   .  Hyperlipidemia   . GERD (gastroesophageal reflux disease)   . Thyroid disease   . Restless leg syndrome   . PONV (postoperative nausea and vomiting)   . Heart murmur   . Sleep apnea     s/p surgery- last sleep study 2011- doesnt use oxygen or machine at night as instructed  . History of blood transfusion 1980  . Hypothyroidism   . Active smoker    BP 127/84  Pulse 101  Resp 14  Ht 5\' 3"  (1.6 m)  Wt 158 lb (71.668 kg)  BMI 28.00 kg/m2  SpO2 94%  Opioid Risk Score:   Fall Risk Score: Moderate Fall Risk (6-13 points) (pt educated, declined handout)    Review of Systems  Constitutional: Positive for unexpected weight change.  Respiratory: Positive for apnea.   Genitourinary:       Bladder control pain  Musculoskeletal: Positive for back pain.  All other systems reviewed and are negative.      Objective:   Physical Exam  Constitutional: She is oriented to person, place, and time. She appears well-developed and well-nourished.  HENT:  Head: Normocephalic.  Eyes: EOM are normal. Pupils are equal, round, and reactive to light.  Neck: Normal range of motion.  Cardiovascular: Normal rate.  Pulmonary/Chest: Effort normal.  Abdominal: Soft.  Musculoskeletal:  Right knee with minimal tenderness. Post-op scars noted. Knee rom 120+ with flexion. Weight bearing better with minimal antalgia seen.  Right shoulder with only mild tenderness with ROM. No shoulder instability noted. Had full AROM. Low back/pelvic with persistent hemi-elevation of the right pelvis and tightness of the lumbar paraspinals particularly on the right. Scar tissue likely around op site. Needs cueing to stand in extension. Able to almost touch toes when standing. Otherwise, she is limited with all plains of ROM. Generalized tenderness of c-spine with some limitation in ROM present Neurological: She is alert and oriented to person, place, and time.  Skin: Skin is warm.  Psychiatric: She has a normal mood and  affect. Her behavior is normal. Judgment and thought content normal.  Assessment & Plan:   ASSESSMENT:   1. 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 Osteoarthritis of the knees bilaterally, right greater than left.  3. Rotator cuff syndrome/subacromial bursitis.  4. Restless legs syndrome.  5. O2 dependent sleep apnea.  6. Tobacco abuse.  7. Right leg length discrepancy.    PLAN:  1. Refilled fentanyl to 50 mcg every 72 hours. Hydrocodone was refilled. Will give her a few additional hydrocodone  to help with occasional HS pain. 2. Continue with 4mg  klonopin at night as well as gabapentin 300mg  bid.  3. CPAP follow up per pulmonary. Needs to have functioning unit where she is getting at least 5-6 hours of night per sleep. autopap unit?  Mgt of her OSA is imperative in improving her sleep, fatigue, and pain. 4. Discussed the importance of posture, and regular exercise. Needs to be worked on daily. 5. Also reviewed smoking cessation, especially at night time initially. 6. Follow up with my NP in one month. 30 minutes of face to face patient care time were spent during this visit. All questions were encouraged and answered.

## 2014-05-05 ENCOUNTER — Telehealth: Payer: Self-pay

## 2014-05-05 ENCOUNTER — Ambulatory Visit (INDEPENDENT_AMBULATORY_CARE_PROVIDER_SITE_OTHER): Payer: PRIVATE HEALTH INSURANCE | Admitting: Internal Medicine

## 2014-05-05 ENCOUNTER — Encounter: Payer: Self-pay | Admitting: Internal Medicine

## 2014-05-05 VITALS — BP 121/79 | HR 86 | Temp 98.1°F | Wt 157.6 lb

## 2014-05-05 DIAGNOSIS — N393 Stress incontinence (female) (male): Secondary | ICD-10-CM

## 2014-05-05 DIAGNOSIS — K5900 Constipation, unspecified: Secondary | ICD-10-CM | POA: Insufficient documentation

## 2014-05-05 DIAGNOSIS — F1110 Opioid abuse, uncomplicated: Secondary | ICD-10-CM

## 2014-05-05 DIAGNOSIS — Z720 Tobacco use: Secondary | ICD-10-CM

## 2014-05-05 DIAGNOSIS — N3946 Mixed incontinence: Secondary | ICD-10-CM

## 2014-05-05 DIAGNOSIS — E039 Hypothyroidism, unspecified: Secondary | ICD-10-CM

## 2014-05-05 DIAGNOSIS — Z23 Encounter for immunization: Secondary | ICD-10-CM

## 2014-05-05 DIAGNOSIS — F17200 Nicotine dependence, unspecified, uncomplicated: Secondary | ICD-10-CM

## 2014-05-05 DIAGNOSIS — Z0000 Encounter for general adult medical examination without abnormal findings: Secondary | ICD-10-CM

## 2014-05-05 DIAGNOSIS — F1190 Opioid use, unspecified, uncomplicated: Secondary | ICD-10-CM

## 2014-05-05 DIAGNOSIS — I10 Essential (primary) hypertension: Secondary | ICD-10-CM

## 2014-05-05 DIAGNOSIS — R6882 Decreased libido: Secondary | ICD-10-CM

## 2014-05-05 DIAGNOSIS — Z1239 Encounter for other screening for malignant neoplasm of breast: Secondary | ICD-10-CM

## 2014-05-05 DIAGNOSIS — J309 Allergic rhinitis, unspecified: Secondary | ICD-10-CM

## 2014-05-05 DIAGNOSIS — E785 Hyperlipidemia, unspecified: Secondary | ICD-10-CM

## 2014-05-05 MED ORDER — FLUTICASONE PROPIONATE 50 MCG/ACT NA SUSP: 2.0000 | Freq: Every day | NASAL | Status: DC

## 2014-05-05 MED ORDER — AMLODIPINE BESYLATE 10 MG PO TABS: 10.0000 mg | ORAL_TABLET | Freq: Every day | ORAL | Status: DC

## 2014-05-05 MED ORDER — BENAZEPRIL HCL 40 MG PO TABS: 40.0000 mg | ORAL_TABLET | Freq: Every day | ORAL | Status: DC

## 2014-05-05 MED ORDER — SENNOSIDES-DOCUSATE SODIUM 8.6-50 MG PO TABS: 1.0000 | ORAL_TABLET | Freq: Every evening | ORAL | Status: DC | PRN

## 2014-05-05 NOTE — Patient Instructions (Signed)
General Instructions:  Please bring your medicines with you each time you come to clinic.  Medicines may include prescription medications, over-the-counter medications, herbal remedies, eye drops, vitamins, or other pills.  High Blood Pressure: Your blood pressure was well controlled today 121/78. However, your clonidine medication is likely making you very fatigued. We will make the following changes: Amlodipine (Norvasc) 10 mg daily (take 2 pills) Benazepril 40 mg daily (take 2 pills) Continue Hydrochlorithiazide (HCTZ) 12.5 mg daily TAKE CLONIDINE 0.1 MG TWICE A DAY FOR 4 DAYS, THEN DISCONTINUE this medication completely.  Allergies: We will start you on a nasal steroid spray. You can stop taking the Zyrtec since it has not been helping.  Urinary Incontinence: Practice timed toileting. Try to urinate often instead of waiting until you feel that you have to use the bathroom. I recommend urinating every 2 hours or so. Also, try practicing pelvic floor kegal exercises. You can do these as often as you would like. We can discuss your symptoms and if they have improved during your next appointment. At this time, we can discuss starting a medication if these exercises do not help improve your symptoms.  Constipation: Your constipation is likely induced by pain medications. We will start you on a medication called Senokot-S that you can take as needed for constipation.   Decreased Libido: Your decreased libido is likely secondary to fatigue, pain medication, and psychological interference. We do not recommend starting any medications or stopping any medications at this time. We can discuss counseling in the future if you feel this would be a benefit to you.   Progress Toward Treatment Goals:  Treatment Goal 01/03/2014  Blood pressure at goal  Stop smoking smoking the same amount    Self Care Goals & Plans:  Self Care Goal 05/05/2014  Manage my medications take my medicines as prescribed; refill  my medications on time; bring my medications to every visit  Eat healthy foods eat baked foods instead of fried foods; eat foods that are low in salt; drink diet soda or water instead of juice or soda  Be physically active -  Stop smoking go to the [employer] (https://scott-booker.info/); cut down the number of cigarettes smoked; call [facility] (1-800-QUIT-NOW)   Urinary Incontinence Urinary incontinence is the involuntary loss of urine from your bladder. CAUSES  There are many causes of urinary incontinence. They include:  Medicines.  Infections.  Prostatic enlargement, leading to overflow of urine from your bladder.  Surgery.  Neurological diseases.  Emotional factors. SIGNS AND SYMPTOMS Urinary Incontinence can be divided into four types: 1. Urge incontinence. Urge incontinence is the involuntary loss of urine before you have the opportunity to go to the bathroom. There is a sudden urge to void but not enough time to reach a bathroom. 2. Stress incontinence. Stress incontinence is the sudden loss of urine with any activity that forces urine to pass. It is commonly caused by anatomical changes to the pelvis and sphincter areas of your body. 3. Overflow incontinence. Overflow incontinence is the loss of urine from an obstructed opening to your bladder. This results in a backup of urine and a resultant buildup of pressure within the bladder. When the pressure within the bladder exceeds the closing pressure of the sphincter, the urine overflows, which causes incontinence, similar to water overflowing a dam. 4. Total incontinence. Total incontinence is the loss of urine as a result of the inability to store urine within your bladder. DIAGNOSIS  Evaluating the cause of incontinence may  require:  A thorough and complete medical and obstetric history.  A complete physical exam.  Laboratory tests such as a urine culture and sensitivities. When additional tests are indicated, they can  include:  An ultrasound exam.  Kidney and bladder X-rays.  Cystoscopy. This is an exam of the bladder using a narrow scope.  Urodynamic testing to test the nerve function to the bladder and sphincter areas. TREATMENT  Treatment for urinary incontinence depends on the cause:  For urge incontinence caused by a bacterial infection, antibiotics will be prescribed. If the urge incontinence is related to medicines you take, your health care provider may have you change the medicine.  For stress incontinence, surgery to re-establish anatomical support to the bladder or sphincter, or both, will often correct the condition.  For overflow incontinence caused by an enlarged prostate, an operation to open the channel through the enlarged prostate will allow the flow of urine out of the bladder. In women with fibroids, a hysterectomy may be recommended.  For total incontinence, surgery on your urinary sphincter may help. An artificial urinary sphincter (an inflatable cuff placed around the urethra) may be required. In women who have developed a hole-like passage between their bladder and vagina (vesicovaginal fistula), surgery to close the fistula often is required. HOME CARE INSTRUCTIONS  Normal daily hygiene and the use of pads or adult diapers that are changed regularly will help prevent odors and skin damage.  Avoid caffeine. It can overstimulate your bladder.  Use the bathroom regularly. Try about every 2-3 hours to go to the bathroom, even if you do not feel the need to do so. Take time to empty your bladder completely. After urinating, wait a minute. Then try to urinate again.  For causes involving nerve dysfunction, keep a log of the medicines you take and a journal of the times you go to the bathroom. SEEK MEDICAL CARE IF:  You experience worsening of pain instead of improvement in pain after your procedure.  Your incontinence becomes worse instead of better. SEE IMMEDIATE MEDICAL CARE  IF:  You experience fever or shaking chills.  You are unable to pass your urine.  You have redness spreading into your groin or down into your thighs. MAKE SURE YOU:   Understand these instructions.   Will watch your condition.  Will get help right away if you are not doing well or get worse. Document Released: 09/26/2004 Document Revised: 06/09/2013 Document Reviewed: 01/26/2013 [facility] Patient Information 2015 [place], [place]. This information is not intended to replace advice given to you by your health care provider. Make sure you discuss any questions you have with your health care provider.  Hypothyroidism The thyroid is a large gland located in the lower front of your neck. The thyroid gland helps control metabolism. Metabolism is how your body handles food. It controls metabolism with the hormone thyroxine. When this gland is underactive (hypothyroid), it produces too little hormone.  CAUSES These include:   Absence or destruction of thyroid tissue.  Goiter due to iodine deficiency.  Goiter due to medications.  Congenital defects (since birth).  Problems with the pituitary. This causes a lack of TSH (thyroid stimulating hormone). This hormone tells the thyroid to turn out more hormone. SYMPTOMS  Lethargy (feeling as though you have no energy)  Cold intolerance  Weight gain (in spite of normal food intake)  Dry skin  Coarse hair  Menstrual irregularity (if severe, may lead to infertility)  Slowing of thought processes Cardiac problems are also  caused by insufficient amounts of thyroid hormone. Hypothyroidism in the newborn is cretinism, and is an extreme form. It is important that this form be treated adequately and immediately or it will lead rapidly to retarded physical and mental development. DIAGNOSIS  To prove hypothyroidism, your caregiver may do blood tests and ultrasound tests. Sometimes the signs are hidden. It may be necessary for your caregiver to  watch this illness with blood tests either before or after diagnosis and treatment. TREATMENT  Low levels of thyroid hormone are increased by using synthetic thyroid hormone. This is a safe, effective treatment. It usually takes about four weeks to gain the full effects of the medication. After you have the full effect of the medication, it will generally take another four weeks for problems to leave. Your caregiver may start you on low doses. If you have had heart problems the dose may be gradually increased. It is generally not an emergency to get rapidly to normal. HOME CARE INSTRUCTIONS   Take your medications as your caregiver suggests. Let your caregiver know of any medications you are taking or start taking. Your caregiver will help you with dosage schedules.  As your condition improves, your dosage needs may increase. It will be necessary to have continuing blood tests as suggested by your caregiver.  Report all suspected medication side effects to your caregiver. SEEK MEDICAL CARE IF: Seek medical care if you develop:  Sweating.  Tremulousness (tremors).  Anxiety.  Rapid weight loss.  Heat intolerance.  Emotional swings.  Diarrhea.  Weakness. SEEK IMMEDIATE MEDICAL CARE IF:  You develop chest pain, an irregular heart beat (palpitations), or a rapid heart beat. MAKE SURE YOU:   Understand these instructions.  Will watch your condition.  Will get help right away if you are not doing well or get worse. Document Released: 08/19/2005 Document Revised: 11/11/2011 Document Reviewed: 04/08/2008 [facility] Patient Information 2015 [place], [place]. This information is not intended to replace advice given to you by your health care provider. Make sure you discuss any questions you have with your health care provider.

## 2014-05-05 NOTE — Progress Notes (Signed)
Subjective:    Patient ID: Jill Phillips, female    DOB: 12/06/1955, 58 y.o.   MRN: [id number]  HPI  Jill Phillips is a 58 yo female with PMHx of OSA, tobacco abuse, HTN, HLD, and hypothyroidism who presents to the clinic for routine follow up, but with complaints of several issues.  Urinary Incontinence: Patient states for the last year she has had urinary incontinence when she sneezes, laughs or coughs. However, in the last month she has developed urinary incontinence when she is sleeping as well. Patient denies every trying any medications for this issue. She does not wear a diaper or a pad, because she does not feel that the issue is that bothersome to her yet. She denies any feelings of sudden or urge or needing to urinate and not being able to make it in time. Patient denies any dysuria, abdominal pain, suprapubic or flank pain, hematuria or fever or chills.  Constipation: Patient complains of increased constipation in the last few months. Patient states she has bowel movements 1-3 times per week, where as before she would go 4-6 times per week, but not everyday. She denies hard stools. She understands that constipation is common in chronic opioid use and she has been on docusate previously. Lately, she has been trying a vegetable laxative without much relief. She feels distended and full. She denies any abdominal pain, melena, hematochezia, or weight loss. Of note, patient has not had a colonoscopy in greater than 10 years. She denies any family history of colon cancer and her last colonoscopy was normal.  Decreased Libido: Patient states that she has not been able to achieve orgasm with a partner or on her own since her divorce in 2008. Patient does not have frequent sexual interactions. Patient went through menopause in 1993, but was able to achieve orgasm with her husband from 66 until their divorce in 2008. She denies depression, but admits to anxiety and fatigue.  Allergies: Patient has  constant non-seasonal allergies that cause her rhinorrhea, watery eyes and nasal congestion. She normally takes Zyrtec, but states that this has not been helping her. She denies sinus pressure, cough, fever or chills.    Review of Systems General: Admits to fatigue. Denies fever, chills, change in appetite and diaphoresis.  Respiratory: Denies SOB, cough, DOE, chest tightness, and wheezing.   Cardiovascular: Denies chest pain and palpitations.  Gastrointestinal: Admits to constipation and bloating. Denies nausea, vomiting, abdominal pain, diarrhea, blood in stool.  Genitourinary: Admits to incontinence. Denies dysuria, urgency, frequency, hematuria, suprapubic pain and flank pain. Endocrine: Denies hot or cold intolerance, polyuria, and polydipsia. Musculoskeletal: Admits to back pain. Denies myalgias,  joint swelling, arthralgias and gait problem.  Skin: Denies pallor, rash and wounds.  Neurological: Denies dizziness, headaches, weakness, lightheadedness, numbness,seizures, and syncope, Psychiatric/Behavioral: Admits to sleep disturbance. Denies mood changes, confusion, nervousness and agitation.   Past Surgical History  Procedure Laterality Date  . Appendectomy    . Abdominal hysterectomy    . Tubal ligation    . Spine surgery      thoracic x 1,  lumbar x 15  . Right hip replacement    . Knee surgeries r knee      arthroscopy- right  . Hammer toe surgery    . Sleep apnea surgery    . Cholecystectomy    . Joint replacement    . Total knee arthroplasty  05/29/2012    Procedure: TOTAL KNEE ARTHROPLASTY;  Surgeon: Mcarthur Rossetti, MD;  Location:  [facility];  Service: Orthopedics;  Laterality: Right;  Right Total Knee Arthroplasty  . Back surgery      16 back surgeries  . Lumbar laminectomy/decompression microdiscectomy N/A 01/28/2014    Procedure: Minimally Invasive Right  L1-2 Microdiscectomy;  Surgeon: [name], MD;  Location: [place];  Service: Orthopedics;  Laterality: N/A;         Objective:   Physical Exam There were no vitals filed for this visit. General: Vital signs reviewed.  Patient is well-developed and well-nourished, in no acute distress and cooperative with exam.  Cardiovascular: RRR, S1 normal, S2 normal, no murmurs, gallops, or rubs. Pulmonary/Chest: Clear to auscultation bilaterally, no wheezes, rales, or rhonchi. Abdominal: Soft, non-tender, non-distended, BS +, no masses, organomegaly, or guarding present.  Musculoskeletal: No joint deformities, erythema, or stiffness, ROM full and nontender. Extremities: No lower extremity edema bilaterally,  pulses symmetric and intact bilaterally. No cyanosis or clubbing. Skin: Warm, dry and intact. No rashes or erythema. Psychiatric: Normal mood and affect. speech and behavior is normal. Cognition and memory are normal.      Assessment & Plan:   Please see problem based assessment and plan.

## 2014-05-05 NOTE — Telephone Encounter (Signed)
Patient states she has attempted several times to reach someone at Dr. Guadelupe Sabin office regarding her Bi Pap. She states Dr. Naaman Plummer informed her to inform him if she had any problems getting in touch with Dr. Rexene Alberts.

## 2014-05-06 ENCOUNTER — Telehealth: Payer: Self-pay | Admitting: Internal Medicine

## 2014-05-06 ENCOUNTER — Encounter: Payer: Self-pay | Admitting: Neurology

## 2014-05-06 ENCOUNTER — Other Ambulatory Visit: Payer: Self-pay | Admitting: Internal Medicine

## 2014-05-06 DIAGNOSIS — R6882 Decreased libido: Secondary | ICD-10-CM | POA: Insufficient documentation

## 2014-05-06 DIAGNOSIS — N393 Stress incontinence (female) (male): Secondary | ICD-10-CM | POA: Insufficient documentation

## 2014-05-06 DIAGNOSIS — E785 Hyperlipidemia, unspecified: Secondary | ICD-10-CM

## 2014-05-06 LAB — LIPID PANEL
Cholesterol: 244 mg/dL — ABNORMAL HIGH (ref 0–200)
HDL: 53 mg/dL (ref >39)
Total CHOL/HDL Ratio: 4.6 Ratio
Triglycerides: 464 mg/dL — ABNORMAL HIGH (ref <150)

## 2014-05-06 LAB — TSH: TSH: 10.103 u[IU]/mL — ABNORMAL HIGH (ref 0.350–4.500)

## 2014-05-06 LAB — LDL CHOLESTEROL, DIRECT: Direct LDL: 145 mg/dL — ABNORMAL HIGH

## 2014-05-06 MED ORDER — GEMFIBROZIL 600 MG PO TABS: 600.0000 mg | ORAL_TABLET | Freq: Two times a day (BID) | ORAL | Status: DC

## 2014-05-06 MED ORDER — LEVOTHYROXINE SODIUM 50 MCG PO TABS: 50.0000 ug | ORAL_TABLET | Freq: Every day | ORAL | Status: DC

## 2014-05-06 NOTE — Telephone Encounter (Signed)
Yes, can we call and help her get her new bipap unit (i believe this is what she's supposed to have)---she's called numerous times apparently

## 2014-05-06 NOTE — Addendum Note (Signed)
Addended by: Larey Dresser A on: 05/06/2014 05:21 PM   Modules accepted: Level of Service

## 2014-05-06 NOTE — Assessment & Plan Note (Signed)
LDL was 145. I will add Gemfibrozil 600 mg BID to reduce her TG, decrease her LDL and increase her HDL. If lipid profile does not improve, we can consider increasing her pravastatin in the future.

## 2014-05-06 NOTE — Assessment & Plan Note (Signed)
BP Readings from Last 3 Encounters:  05/05/14 121/79  05/02/14 127/84  03/11/14 173/99    Lab Results  Component Value Date   NA 144 01/20/2014   K 3.8 01/20/2014   CREATININE 1.02 01/20/2014    Assessment: Blood pressure control:  Well Controlled Progress toward BP goal:   At Goal Comments: Patient has been taking amlodipine 5 mg daily, benazepril 20 mg daily, and HCTZ 12.5 mg daily, and clonidine 0.1 mg TID. Patient reports heavy somnolence during the day.  Plan: Medications:  Due to patient's increased somnolence, we will change her medications to discontinue clonidine this week. Patient will increase Amlodipine to 10 mg daily, Benazepril to 40 mg daily, continue HCTZ as 12.5 mg daily and will take Clonidine 0.1 mg BID for 4 days and then discontinue.  Educational resources provided: brochure Self management tools provided:   Other plans: Patient will follow up in one month to reassess somnolence and blood pressure control.

## 2014-05-06 NOTE — Assessment & Plan Note (Signed)
Patient is due for a mammogram. We have set her up with a referral today. Patient is also due for a colonoscopy. We will refer her at her next visit. Patient also received a flu shot today.

## 2014-05-06 NOTE — Assessment & Plan Note (Signed)
Assessment: Patient has a history of hypothyroidism and is on Synthroid 25 mcg daily. She has not had her TSH checked since 05/06/2013. She reports somnolence, weight gain, fatigue and constipation.  Plan: We rechecked her TSH level and it was elevated at 10. We will increase her Synthroid to 50 mcg daily and recheck her TSH at the next visit.

## 2014-05-06 NOTE — Assessment & Plan Note (Signed)
Assessment: Patient has a history of hyperlipidemia. Patient has been taking pravastatin 20 mg daily, but had not had her lipid profile checked in several years.  Plan: We rechecked her lipid profile which showed :  Ref. Range 05/05/2014 15:09  Cholesterol Latest Range: 0-200 mg/dL 244 (H)  Triglycerides Latest Range: <150 mg/dL 464 (H)  HDL Latest Range: >39 mg/dL 53  LDL (calc) Latest Range: 0-99 mg/dL NOT CALC  VLDL Latest Range: 0-40 mg/dL NOT CALC  Total CHOL/HDL Ratio No range found 4.6   Her LDL was not calculated, so we will repeat a direct LDL. I will change her medication based on the LDL level and consider additional medication for control of Triglycerides.

## 2014-05-06 NOTE — Assessment & Plan Note (Signed)
Assessment: Patient describes worsening incontinence over the past year. Patient previously had incontinence with laughing, sneezing or coughing, but now she will often have incontinence while she is sleeping. She has not tried any medications for this issue. She does not wear pads or diapers.  Plan: Patient would like to try conservative measures first. I counseled her on timed toileting: urinating frequently (every 2 hours), instead of waiting until she feels the need to go. I also explained to the patient the importance of strengthening her pelvic floor muscles by doing Kegel exercises, which she is familiar with. She will try these two conservative measures over the next month and will follow up to discuss improvement. If the issue becomes more bothersome for her, we can try added on an anti-muscarinic such as oxybutynin.

## 2014-05-06 NOTE — Progress Notes (Signed)
I saw and evaluated the patient.  I personally confirmed the key portions of the history and exam documented by Dr. Marvel Plan and I reviewed pertinent patient test results.  The assessment, diagnosis, and plan were formulated together and I agree with the documentation in the resident's note.

## 2014-05-06 NOTE — Telephone Encounter (Signed)
Informed patient of high triglyceride level in the upper 464s, elevated LDL 145. I will prescribe Gemfibrozil 600 mg BID in addition to her current pravastatin regimen. Patient understands and agrees with the plan.

## 2014-05-06 NOTE — Assessment & Plan Note (Signed)
Assessment: Patient describes decreased libido an inability to achieve orgasm since her divorce in 2008. Patient was previously able to achieve orgasm and had normal libido even after menopause in 1993. I think problem is likely secondary to somnolence, fatigue (due to OSA), anxiety, and partially due to chronic opioid use and a psychological factor since her last orgasm was with her husband.   Plan: We will wean patient on clonidine to help her feel less fatigued. She will follow up with her neurologist and pulmonologist to assist with her OSA and CPAP machine. It is not appropriate to take patient off opioid medications at this time and these medications are managed by Dr. Naaman Plummer. If patient continues to have issues, we can consider referring her to a counselor.

## 2014-05-06 NOTE — Assessment & Plan Note (Signed)
Assessment: Patient reports increased constipation with 1-2 bowel movements per week for the last few months. Constipation is most likely 2/2 to chronic opioid use. Patient has not had a colonoscopy in more than 10 years and will need a referral during her next visit in one month.  Plan: Senakot-S 1 tab po QHS prn constipation

## 2014-05-06 NOTE — Telephone Encounter (Signed)
I called the patient regarding her elevated TSH level. We will increase her Synthroid to 50 mcg and have her follow up in one month. The patient understands and agrees with the plan.

## 2014-05-09 ENCOUNTER — Other Ambulatory Visit: Payer: Self-pay | Admitting: Physical Medicine & Rehabilitation

## 2014-05-10 NOTE — Telephone Encounter (Signed)
[facility] @ [phone] to follow up on patient's Bi Pap Unit. Leave a message on the [facility] VM to please return patient's call regarding her Bi Pap machine.

## 2014-05-17 ENCOUNTER — Ambulatory Visit (HOSPITAL_COMMUNITY): Payer: Medicare Other | Attending: Internal Medicine

## 2014-05-19 ENCOUNTER — Other Ambulatory Visit (INDEPENDENT_AMBULATORY_CARE_PROVIDER_SITE_OTHER): Payer: Medicare Other

## 2014-05-19 ENCOUNTER — Encounter: Payer: Self-pay | Admitting: Pulmonary Disease

## 2014-05-19 ENCOUNTER — Ambulatory Visit (INDEPENDENT_AMBULATORY_CARE_PROVIDER_SITE_OTHER): Payer: Medicare Other | Admitting: Pulmonary Disease

## 2014-05-19 VITALS — BP 116/78 | HR 94 | Temp 97.8°F | Ht 63.0 in | Wt 154.0 lb

## 2014-05-19 DIAGNOSIS — G4731 Primary central sleep apnea: Secondary | ICD-10-CM

## 2014-05-19 DIAGNOSIS — G4733 Obstructive sleep apnea (adult) (pediatric): Secondary | ICD-10-CM

## 2014-05-19 DIAGNOSIS — G4737 Central sleep apnea in conditions classified elsewhere: Secondary | ICD-10-CM

## 2014-05-19 DIAGNOSIS — D649 Anemia, unspecified: Secondary | ICD-10-CM

## 2014-05-19 DIAGNOSIS — G2581 Restless legs syndrome: Secondary | ICD-10-CM

## 2014-05-19 DIAGNOSIS — R059 Cough, unspecified: Secondary | ICD-10-CM

## 2014-05-19 DIAGNOSIS — Z720 Tobacco use: Secondary | ICD-10-CM

## 2014-05-19 DIAGNOSIS — R05 Cough: Secondary | ICD-10-CM

## 2014-05-19 DIAGNOSIS — F17200 Nicotine dependence, unspecified, uncomplicated: Secondary | ICD-10-CM

## 2014-05-19 DIAGNOSIS — G4730 Sleep apnea, unspecified: Secondary | ICD-10-CM

## 2014-05-19 DIAGNOSIS — G4739 Other sleep apnea: Secondary | ICD-10-CM

## 2014-05-19 LAB — FERRITIN: Ferritin: 33.7 ng/mL (ref 10.0–291.0)

## 2014-05-19 NOTE — Progress Notes (Signed)
Chief Complaint  Patient presents with  . SLEEP CONSULT    Referred by Dr Rexene Alberts. Epworth Score: 19    History of Present Illness: Jill Phillips is a 58 y.o. female for evaluation of sleep problems.  She has been followed by Dr. Rexene Alberts with neurology for sleep difficulties.  She has hx of scoliosis, chronic pain, and restless leg syndrome.  She was first found to have sleep apnea in 2007, and was on CPAP then.  She then had sleep study in 2012 which did not show apnea, but she was prescribed oxygen.  She also had UPPP and tonsillectomy before.  She had repeat sleep study on 05/21/13 >> AHI 15.7 (44% central apneas), SaO2 low 82%.  She then had BiPAP titration study on 11/10/13 >> BiPAP 9/5 with 2 liters oxygen seemed optimal.  She continues to have trouble with her sleep.  She feels sleepy all the time.  Her legs really bother her, and especially at night.  She has been trying to use BiPAP with nasal pillows and 2 liters oxygen, but the BiPAP is not very comfortable.    She is a smoker.  She goes to sleep between 8 and 10 pm.  She falls asleep after 20 minutes.  She wakes up every 2 hours, and sometimes to use the bathroom.  She gets out of bed at 630 am.  She feels exhausted in the morning.  She denies morning headache.  She does not use anything to help her fall sleep or stay awake.  She denies sleep walking, sleep talking, bruxism, or nightmares.  She denies sleep hallucinations, sleep paralysis, or cataplexy.  The Epworth score is 19 out of 24.  Tests: PSG 05/21/13 >> AHI 15.7, SaO2 low 82%  Jill Phillips  has a past medical history of Postlaminectomy syndrome, thoracic region; Osteoarthrosis, unspecified whether generalized or localized, lower leg; Dysthymic disorder; Calcifying tendinitis of shoulder; Pain in joint, upper arm; Chronic pain syndrome; Lumbago; Primary localized osteoarthrosis, lower leg; Hypertension; Hyperlipidemia; GERD (gastroesophageal reflux disease); Thyroid disease;  Restless leg syndrome; PONV (postoperative nausea and vomiting); Heart murmur; Sleep apnea; History of blood transfusion (1980); Hypothyroidism; and Active smoker.  Jill Phillips  has past surgical history that includes Appendectomy; Abdominal hysterectomy; Tubal ligation; Spine surgery; right hip replacement; knee surgeries r knee; Hammer toe surgery; sleep apnea surgery; Cholecystectomy; Joint replacement; Total knee arthroplasty (05/29/2012); Back surgery; and Lumbar laminectomy/decompression microdiscectomy (N/A, 01/28/2014).  Prior to Admission medications   Medication Sig Start Date End Date Taking? Authorizing Provider  acetaminophen (TYLENOL) 500 MG tablet Take 1,000 mg by mouth every 6 (six) hours as needed for moderate pain. For pain   Yes Historical Provider, MD  albuterol (PROVENTIL HFA;VENTOLIN HFA) 108 (90 BASE) MCG/ACT inhaler Inhale 1 puff into the lungs every 6 (six) hours as needed for wheezing or shortness of breath.   Yes Historical Provider, MD  ALPRAZolam Duanne Moron) 1 MG tablet Take 1 tablet by mouth daily as needed for anxiety.  05/27/13  Yes Historical Provider, MD  amLODipine (NORVASC) 10 MG tablet Take 1 tablet (10 mg total) by mouth daily. 05/05/14  Yes Alexa Marvel Plan, MD  benazepril (LOTENSIN) 40 MG tablet Take 1 tablet (40 mg total) by mouth daily. 05/05/14  Yes Alexa Marvel Plan, MD  carisoprodol (SOMA) 350 MG tablet TAKE 1 TABLET BY MOUTH EVERY 8 HOURS AS NEEDED. 05/10/14  Yes Meredith Staggers, MD  diclofenac sodium (VOLTAREN) 1 % GEL Apply 2 g topically 3 (three) times daily.  Yes Historical Provider, MD  ESZOPICLONE 3 MG tablet Take 3 mg by mouth at bedtime.  09/28/12  Yes Historical Provider, MD  fentaNYL (DURAGESIC) 50 MCG/HR Place 1 patch (50 mcg total) onto the skin every 3 (three) days. 05/02/14  Yes Meredith Staggers, MD  fexofenadine-pseudoephedrine (ALLEGRA-D) 60-120 MG per tablet Take 1 tablet by mouth 2 (two) times daily. 02/28/14  Yes Nischal Narendra, MD  fluticasone  (FLONASE) 50 MCG/ACT nasal spray Place 2 sprays into both nostrils daily. 05/05/14 05/05/15 Yes Alexa Marvel Plan, MD  gabapentin (NEURONTIN) 300 MG capsule TAKE 1 CAPSULE BY MOUTH TWICE A DAY.   Yes Meredith Staggers, MD  gemfibrozil (LOPID) 600 MG tablet Take 1 tablet (600 mg total) by mouth 2 (two) times daily. 05/06/14 05/06/15 Yes Alexa Marvel Plan, MD  hydrochlorothiazide (HYDRODIURIL) 12.5 MG tablet Take 1 tablet by mouth daily. 03/09/14  Yes Historical Provider, MD  HYDROcodone-acetaminophen (NORCO) 10-325 MG per tablet Take 1 tablet by mouth every 6 (six) hours as needed for severe pain. 05/02/14  Yes Meredith Staggers, MD  levothyroxine (SYNTHROID, LEVOTHROID) 50 MCG tablet Take 1 tablet (50 mcg total) by mouth daily before breakfast. 05/06/14  Yes Alexa Marvel Plan, MD  naproxen (NAPROSYN) 500 MG tablet Take 750 mg by mouth at bedtime as needed for moderate pain.   Yes Historical Provider, MD  omeprazole (PRILOSEC) 20 MG capsule TAKE 1 CAPSULE BY MOUTH TWICE A DAY. 04/28/14  Yes Sid Falcon, MD  pravastatin (PRAVACHOL) 20 MG tablet TAKE 1 TABLET BY MOUTH DAILY. NEED OFFICE VISIT AND LABS BEFORE FURTHER REFILLS   Yes Susy Frizzle, MD  predniSONE (STERAPRED UNI-PAK) 5 MG TABS tablet Take 1 tablet by mouth daily. 03/10/14  Yes Historical Provider, MD  promethazine (PHENERGAN) 25 MG tablet Take 1 tablet by mouth as needed. 02/18/14  Yes Historical Provider, MD  senna-docusate (SENOKOT S) 8.6-50 MG per tablet Take 1 tablet by mouth at bedtime as needed for mild constipation. 05/05/14  Yes Alexa Marvel Plan, MD  valACYclovir (VALTREX) 500 MG tablet TAKE 1 TABLET BY MOUTH DAILY. 02/28/14  Yes Nischal Dareen Piano, MD  venlafaxine XR (EFFEXOR-XR) 150 MG 24 hr capsule Take 150 mg by mouth daily. 05/02/14  Yes Historical Provider, MD    Allergies  Allergen Reactions  . Amoxicillin Other (See Comments)    REACTION: Oral yeast infection  . Chlorzoxazone Other (See Comments)    REACTION: headache  . Codeine Other (See  Comments)    REACTION: headache  . Darvocet [Propoxyphene N-Acetaminophen] Itching  . Dilaudid [Hydromorphone Hcl] Itching  . Flagyl [Metronidazole] Diarrhea  . Keflex [Cephalexin] Other (See Comments)    REACTION: unknown  . Morphine And Related Itching  . Nitrofurantoin Monohyd Macro Hives  . Oxycodone-Acetaminophen Itching  . Percocet [Oxycodone-Acetaminophen] Itching  . Sulfa Antibiotics Other (See Comments)    REACTION: Yeast infection in mouth    Her family history includes Anuerysm (age of onset: 41) in her brother; Heart disease in her brother and father; Heart disease (age of onset: 73) in her sister; Hypertension in her sister; Kidney disease in her mother.  She  reports that she has been smoking Cigarettes.  She has a 90 pack-year smoking history. She has never used smokeless tobacco. She reports that she does not drink alcohol or use illicit drugs.  Review of Systems  Constitutional: Negative for fever and unexpected weight change.  HENT: Negative for congestion, dental problem, ear pain, nosebleeds, postnasal drip, rhinorrhea, sinus pressure, sneezing, sore throat and trouble swallowing.  Allergies  Eyes: Negative for redness and itching.  Respiratory: Negative for cough, chest tightness, shortness of breath and wheezing.   Cardiovascular: Negative for palpitations and leg swelling.  Gastrointestinal: Negative for nausea and vomiting.  Genitourinary: Negative for dysuria.  Musculoskeletal: Negative for joint swelling.  Skin: Negative for rash.  Neurological: Positive for headaches.  Hematological: Does not bruise/bleed easily.  Psychiatric/Behavioral: Positive for dysphoric mood. The patient is nervous/anxious.    Physical Exam:  General - No distress ENT - No sinus tenderness, no oral exudate, no LAN, no thyromegaly, TM clear, pupils equal/reactive Cardiac - s1s2 regular, no murmur, pulses symmetric Chest - No wheeze/rales/dullness, good air entry, normal  respiratory excursion Back - No focal tenderness Abd - Soft, non-tender, no organomegaly, + bowel sounds Ext - No edema Neuro - Normal strength, cranial nerves intact Skin - No rashes Psych - Normal mood, and behavior  Assessment/plan:  Chesley Mires, M.D. Pager [phone]

## 2014-05-19 NOTE — Patient Instructions (Signed)
Lab test today Will schedule sleep study and breathing test (PFT) Will call to schedule follow up after test results available

## 2014-05-19 NOTE — Progress Notes (Deleted)
   Subjective:    Patient ID: Jill Phillips, female    DOB: 04-Jan-1956, 58 y.o.   MRN: [id number]  HPI    Review of Systems  Constitutional: Negative for fever and unexpected weight change.  HENT: Negative for congestion, dental problem, ear pain, nosebleeds, postnasal drip, rhinorrhea, sinus pressure, sneezing, sore throat and trouble swallowing.        Allergies  Eyes: Negative for redness and itching.  Respiratory: Negative for cough, chest tightness, shortness of breath and wheezing.   Cardiovascular: Negative for palpitations and leg swelling.  Gastrointestinal: Negative for nausea and vomiting.  Genitourinary: Negative for dysuria.  Musculoskeletal: Negative for joint swelling.  Skin: Negative for rash.  Neurological: Positive for headaches.  Hematological: Does not bruise/bleed easily.  Psychiatric/Behavioral: Positive for dysphoric mood. The patient is nervous/anxious.        Objective:   Physical Exam        Assessment & Plan:

## 2014-05-20 ENCOUNTER — Telehealth: Payer: Self-pay | Admitting: Pulmonary Disease

## 2014-05-20 NOTE — Telephone Encounter (Signed)
Iron/TIBC/Ferritin/ %Sat    Component Value Date/Time   FERRITIN 33.7 05/19/2014 1537    Will have my nurse inform pt that ferritin level was low.  She should start ferrous sulfate 325 mg twice per day with meals.  She should avoid taking with calcium supplements or dairy products.  She also needs to take 100 mg of vitamin C twice daily.

## 2014-05-20 NOTE — Telephone Encounter (Signed)
ATC unable to leave voicemail wcb

## 2014-05-26 ENCOUNTER — Ambulatory Visit (INDEPENDENT_AMBULATORY_CARE_PROVIDER_SITE_OTHER): Payer: Medicare Other | Admitting: Pulmonary Disease

## 2014-05-26 ENCOUNTER — Telehealth: Payer: Self-pay | Admitting: Pulmonary Disease

## 2014-05-26 DIAGNOSIS — R05 Cough: Secondary | ICD-10-CM

## 2014-05-26 DIAGNOSIS — Z720 Tobacco use: Secondary | ICD-10-CM

## 2014-05-26 DIAGNOSIS — G4730 Sleep apnea, unspecified: Secondary | ICD-10-CM | POA: Insufficient documentation

## 2014-05-26 DIAGNOSIS — R059 Cough, unspecified: Secondary | ICD-10-CM

## 2014-05-26 DIAGNOSIS — F17200 Nicotine dependence, unspecified, uncomplicated: Secondary | ICD-10-CM

## 2014-05-26 LAB — PULMONARY FUNCTION TEST
DL/VA % pred: 79 %
DL/VA: 3.79 ml/min/mmHg/L
DLCO unc % pred: 65 %
DLCO unc: 15.9 ml/min/mmHg
FEF 25-75 Post: 2.89 L/sec
FEF 25-75 Pre: 2.23 L/sec
FEF2575-%Change-Post: 29 %
FEF2575-%Pred-Post: 121 %
FEF2575-%Pred-Pre: 93 %
FEV1-%Change-Post: 4 %
FEV1-%Pred-Post: 88 %
FEV1-%Pred-Pre: 84 %
FEV1-Post: 2.28 L
FEV1-Pre: 2.17 L
FEV1FVC-%Change-Post: 4 %
FEV1FVC-%Pred-Pre: 104 %
FEV6-%Change-Post: 0 %
FEV6-%Pred-Post: 82 %
FEV6-%Pred-Pre: 82 %
FEV6-Post: 2.66 L
FEV6-Pre: 2.66 L
FEV6FVC-%Change-Post: 0 %
FEV6FVC-%Pred-Post: 103 %
FEV6FVC-%Pred-Pre: 103 %
FVC-%Change-Post: 0 %
FVC-%Pred-Post: 80 %
FVC-%Pred-Pre: 80 %
FVC-Post: 2.66 L
FVC-Pre: 2.66 L
Post FEV1/FVC ratio: 86 %
Post FEV6/FVC ratio: 100 %
Pre FEV1/FVC ratio: 82 %
Pre FEV6/FVC Ratio: 100 %
RV % pred: 76 %
RV: 1.5 L
TLC % pred: 80 %
TLC: 4.05 L

## 2014-05-26 NOTE — Telephone Encounter (Signed)
ATC x2 unable to leave voicemail -- mailbox not activated wcb

## 2014-05-26 NOTE — Assessment & Plan Note (Addendum)
She has history of sleep apnea with previous sleep study showing both obstructive and central apnea >> her sleep apnea was mild.  There was also concern she could have sleep related hyoxia/hypoventilation and has been prescribed oxygen at night.  She has difficulty tolerating her current set up.  She is on several narcotic medications which could be contributing to central apnea, and hypoventilation.  She has hx of scoliosis and is a smoker >> she could have restrictive and/or obstructive lung disease contributing to hypoventilation/nocturnal hypoxemia.    To further assess will arrange for repeat sleep study.  Will also arrange for PFT's.  Will then re-assess optimal set up options for her at night.

## 2014-05-26 NOTE — Telephone Encounter (Signed)
Pt had her bipap machine picked up today due to the cost and she is not able to afford this.  Pt has the home oxygen at home and wanted to see if she should just use this.  VS please advise. Thanks        Called and spoke with pt and she is aware of VS recs. Nothing further is needed.

## 2014-05-26 NOTE — Progress Notes (Signed)
PFT done today. 

## 2014-05-26 NOTE — Assessment & Plan Note (Signed)
Discussed the importance of smoking.  Will approach this more after review of her PFT's.  She can continue prn albuterol for now.

## 2014-05-26 NOTE — Telephone Encounter (Signed)
PFT 05/26/14 >> FEV1 2.28 (88%), FEV1% 86, TLC 4.05 (80%), DLCO 65%, no BD response.  Will have my nurse inform pt that it is okay to continue with just oxygen at night for now.  Will discuss further adjustments after review of her sleep study.  Will also have my nurse inform patient that breathing test was unremarkable.  Will discuss in more detail at next visit.

## 2014-05-26 NOTE — Assessment & Plan Note (Signed)
This continues to be a significant issue for her.  Will check her ferritin >> if low, then should might benefit from iron supplementation to get her ferritin level above 75.  She is to continue other medications for this per primary care, neurology, and pain management.

## 2014-05-27 ENCOUNTER — Ambulatory Visit: Payer: PRIVATE HEALTH INSURANCE | Admitting: Physical Medicine & Rehabilitation

## 2014-05-27 ENCOUNTER — Encounter: Payer: Self-pay | Admitting: Registered Nurse

## 2014-05-27 ENCOUNTER — Encounter: Payer: Medicare Other | Attending: Physical Medicine and Rehabilitation | Admitting: Registered Nurse

## 2014-05-27 VITALS — BP 94/61 | HR 94 | Resp 16

## 2014-05-27 DIAGNOSIS — M961 Postlaminectomy syndrome, not elsewhere classified: Secondary | ICD-10-CM

## 2014-05-27 DIAGNOSIS — IMO0002 Reserved for concepts with insufficient information to code with codable children: Secondary | ICD-10-CM | POA: Diagnosis not present

## 2014-05-27 DIAGNOSIS — M2170 Unequal limb length (acquired), unspecified site: Secondary | ICD-10-CM

## 2014-05-27 DIAGNOSIS — M75101 Unspecified rotator cuff tear or rupture of right shoulder, not specified as traumatic: Secondary | ICD-10-CM

## 2014-05-27 DIAGNOSIS — M1710 Unilateral primary osteoarthritis, unspecified knee: Secondary | ICD-10-CM

## 2014-05-27 DIAGNOSIS — M719 Bursopathy, unspecified: Secondary | ICD-10-CM | POA: Diagnosis present

## 2014-05-27 DIAGNOSIS — Z5181 Encounter for therapeutic drug level monitoring: Secondary | ICD-10-CM | POA: Diagnosis present

## 2014-05-27 DIAGNOSIS — Z79899 Other long term (current) drug therapy: Secondary | ICD-10-CM

## 2014-05-27 DIAGNOSIS — G2581 Restless legs syndrome: Secondary | ICD-10-CM | POA: Diagnosis present

## 2014-05-27 DIAGNOSIS — M67919 Unspecified disorder of synovium and tendon, unspecified shoulder: Secondary | ICD-10-CM

## 2014-05-27 MED ORDER — HYDROCODONE-ACETAMINOPHEN 10-325 MG PO TABS: 1.0000 | ORAL_TABLET | Freq: Four times a day (QID) | ORAL | Status: DC | PRN

## 2014-05-27 MED ORDER — FENTANYL 50 MCG/HR TD PT72: 50.0000 ug | MEDICATED_PATCH | TRANSDERMAL | Status: DC

## 2014-05-27 NOTE — Telephone Encounter (Signed)
LMTCB x 1 

## 2014-05-27 NOTE — Telephone Encounter (Signed)
Results have been explained to patient, pt expressed understanding. Nothing further needed.  

## 2014-05-27 NOTE — Progress Notes (Signed)
Subjective:    Patient ID: Jill Phillips, female    DOB: Jan 18, 1956, 58 y.o.   MRN: [id number]  HPI: Jill Phillips is a 58 year old female who returns for follow up for chronic pain and medication refill. She says her pain is located in her mid-back, left side, and bilateral hips. Left greater than right. Her current exercise regime is walking to the mailbox daily. She has been encouraged to start chair exercises and demonstrated and verbalizes understanding.   Pain Inventory Average Pain 6 Pain Right Now 7 My pain is constant, dull, aching and spasms  In the last 24 hours, has pain interfered with the following? General activity 7 Relation with others 7 Enjoyment of life 7 What TIME of day is your pain at its worst? morning, evening, night Sleep (in general) Fair  Pain is worse with: walking, bending, sitting and some activites Pain improves with: rest and medication Relief from Meds: 9  Mobility walk without assistance  Function disabled: date disabled 71  Neuro/Psych trouble walking  Prior Studies Any changes since last visit?  no  Physicians involved in your care Any changes since last visit?  no   Family History  Problem Relation Age of Onset  . Kidney disease Mother   . Heart disease Father   . Anuerysm Brother 29    brain  . Heart disease Brother   . Heart disease Sister 100    s/p CABG  . Hypertension Sister    History   Social History  . Marital Status: Divorced    Spouse Name: n/a    Number of Children: 2  . Years of Education: 12+   Occupational History  . disability     back surgeries   Social History Main Topics  . Smoking status: Current Every Day Smoker -- 2.00 packs/day for 45 years    Types: Cigarettes  . Smokeless tobacco: Never Used     Comment: SMOKES 2PPD/ NOT READY TO QUIT  . Alcohol Use: No  . Drug Use: No  . Sexual Activity: Not Currently    Partners: Male   Other Topics Concern  . None   Social History  Narrative   Lives alone.  One daughter is local, but is getting ready to move to [place], where her children live with their father.  The other daughter lives near [employer], [place].   Past Surgical History  Procedure Laterality Date  . Appendectomy    . Abdominal hysterectomy    . Tubal ligation    . Spine surgery      thoracic x 1,  lumbar x 15  . Right hip replacement    . Knee surgeries r knee      arthroscopy- right  . Hammer toe surgery    . Sleep apnea surgery    . Cholecystectomy    . Joint replacement    . Total knee arthroplasty  05/29/2012    Procedure: TOTAL KNEE ARTHROPLASTY;  Surgeon: Mcarthur Rossetti, MD;  Location: [facility];  Service: Orthopedics;  Laterality: Right;  Right Total Knee Arthroplasty  . Back surgery      16 back surgeries  . Lumbar laminectomy/decompression microdiscectomy N/A 01/28/2014    Procedure: Minimally Invasive Right  L1-2 Microdiscectomy;  Surgeon: [name], MD;  Location: [facility];  Service: Orthopedics;  Laterality: N/A;   Past Medical History  Diagnosis Date  . Postlaminectomy syndrome, thoracic region   . Osteoarthrosis, unspecified whether generalized or localized,  lower leg   . Dysthymic disorder   . Calcifying tendinitis of shoulder   . Pain in joint, upper arm   . Chronic pain syndrome   . Lumbago   . Primary localized osteoarthrosis, lower leg   . Hypertension   . Hyperlipidemia   . GERD (gastroesophageal reflux disease)   . Thyroid disease   . Restless leg syndrome   . PONV (postoperative nausea and vomiting)   . Heart murmur   . Sleep apnea     s/p surgery- last sleep study 2011- doesnt use oxygen or machine at night as instructed  . History of blood transfusion 1980  . Hypothyroidism   . Active smoker    There were no vitals taken for this visit.  Opioid Risk Score:   Fall Risk Score: Moderate Fall Risk (6-13 points)   Review of Systems     Objective:   Physical Exam  Nursing note and vitals  reviewed. Constitutional: She is oriented to person, place, and time. She appears well-developed and well-nourished.  HENT:  Head: Normocephalic and atraumatic.  Neck: Normal range of motion. Neck supple.  Cardiovascular: Normal rate and regular rhythm.   Pulmonary/Chest: Effort normal and breath sounds normal.  Musculoskeletal:  Normal Muscle Bulk and Muscle Testing Reveals: Upper Extremities: Full ROM and Muscle Strength 5/5 Thoracic Paraspinal Tenderness: T-7- T-9 Lumbar Paraspinal Tenderness: L-3- L-5 Lower Extremities: Full ROM and Muscle Strength 5/5 Lower Extremities Extension Produces Pain into Popliteal Fossa Arises from chair with ease Narrow Based gait  Neurological: She is alert and oriented to person, place, and time.  Skin: Skin is warm and dry.  Psychiatric: She has a normal mood and affect.          Assessment & Plan:  1. Chronic lumbar spine pain/post-lami syndrome: Refilled: Fentanyl 50 MCG one patch every three days #10 and Hydrocodone 10/325 mg one tablet every 6 hours as needed for sever pain #70. 2. Osteoarthritis of the knees bilaterally: Continue with Heat, exercise and voltaren gel.  3. Rotator cuff syndrome/subacromial bursitis: No complaints voiced today. 4. Restless legs syndrome: No complaints voiced  20 minutes of face to face patient care time was spent during this visit. All questions were encouraged and answered.   F/u in 1 month

## 2014-05-28 ENCOUNTER — Other Ambulatory Visit: Payer: Self-pay | Admitting: Internal Medicine

## 2014-05-28 ENCOUNTER — Other Ambulatory Visit: Payer: Self-pay | Admitting: Physical Medicine & Rehabilitation

## 2014-05-30 ENCOUNTER — Other Ambulatory Visit: Payer: Self-pay | Admitting: *Deleted

## 2014-05-30 DIAGNOSIS — B009 Herpesviral infection, unspecified: Secondary | ICD-10-CM

## 2014-05-30 DIAGNOSIS — K219 Gastro-esophageal reflux disease without esophagitis: Secondary | ICD-10-CM

## 2014-05-30 MED ORDER — VALACYCLOVIR HCL 500 MG PO TABS: 500.0000 mg | ORAL_TABLET | Freq: Every day | ORAL | Status: DC

## 2014-05-30 MED ORDER — OMEPRAZOLE 20 MG PO CPDR: 20.0000 mg | DELAYED_RELEASE_CAPSULE | Freq: Two times a day (BID) | ORAL | Status: DC

## 2014-05-30 NOTE — Telephone Encounter (Signed)
Results have been explained to patient, pt expressed understanding. Nothing further needed.  

## 2014-05-30 NOTE — Telephone Encounter (Signed)
Hi Jill Phillips,  I refilled patient's valacyclovir and omeprazole.

## 2014-06-03 NOTE — Telephone Encounter (Signed)
Pharmacy confirmed Rx.

## 2014-06-03 NOTE — Telephone Encounter (Signed)
Done by Dr Marvel Plan.

## 2014-06-04 ENCOUNTER — Other Ambulatory Visit: Payer: Self-pay | Admitting: Internal Medicine

## 2014-06-06 NOTE — Telephone Encounter (Signed)
I will refill Synthroid 50 mcg daily. Thank you!

## 2014-06-07 ENCOUNTER — Other Ambulatory Visit: Payer: Self-pay | Admitting: Internal Medicine

## 2014-06-07 NOTE — Telephone Encounter (Signed)
Patient should continue HCTZ 12.5 mg daily per my previous office note. I will refill at this time.

## 2014-06-08 ENCOUNTER — Encounter: Payer: Self-pay | Admitting: Internal Medicine

## 2014-06-08 ENCOUNTER — Telehealth: Payer: Self-pay | Admitting: *Deleted

## 2014-06-08 ENCOUNTER — Ambulatory Visit (INDEPENDENT_AMBULATORY_CARE_PROVIDER_SITE_OTHER): Payer: Medicare Other | Admitting: Internal Medicine

## 2014-06-08 VITALS — BP 135/88 | HR 78 | Temp 98.0°F | Wt 154.0 lb

## 2014-06-08 DIAGNOSIS — Z0000 Encounter for general adult medical examination without abnormal findings: Secondary | ICD-10-CM

## 2014-06-08 DIAGNOSIS — E785 Hyperlipidemia, unspecified: Secondary | ICD-10-CM

## 2014-06-08 DIAGNOSIS — G2581 Restless legs syndrome: Secondary | ICD-10-CM

## 2014-06-08 DIAGNOSIS — E039 Hypothyroidism, unspecified: Secondary | ICD-10-CM

## 2014-06-08 DIAGNOSIS — Z720 Tobacco use: Secondary | ICD-10-CM

## 2014-06-08 DIAGNOSIS — I10 Essential (primary) hypertension: Secondary | ICD-10-CM

## 2014-06-08 DIAGNOSIS — K5901 Slow transit constipation: Secondary | ICD-10-CM

## 2014-06-08 DIAGNOSIS — N393 Stress incontinence (female) (male): Secondary | ICD-10-CM

## 2014-06-08 MED ORDER — PRAVASTATIN SODIUM 40 MG PO TABS: 40.0000 mg | ORAL_TABLET | Freq: Every day | ORAL | Status: DC

## 2014-06-08 NOTE — Patient Instructions (Signed)
General Instructions:  Please bring your medicines with you each time you come to clinic.  Medicines may include prescription medications, over-the-counter medications, herbal remedies, eye drops, vitamins, or other pills.  **CHANGES**  I have DISCONTINUED your Gemfibrozil. You no longer need to take this medication.  I have INCREASED your cholesterol medication (Pravastatin) to 40 mg daily.   We rechecked your TSH level (thyroid hormone) today. I will notify you of the results.   Progress Toward Treatment Goals:  Treatment Goal 01/03/2014  Blood pressure at goal  Stop smoking smoking the same amount    Self Care Goals & Plans:  Self Care Goal 06/08/2014  Manage my medications take my medicines as prescribed; refill my medications on time; bring my medications to every visit  Eat healthy foods eat foods that are low in salt; eat baked foods instead of fried foods  Be physically active -  Stop smoking go to the [employer] (https://scott-booker.info/); call [facility] (1-800-QUIT-NOW); cut down the number of cigarettes smoked    Smoking Cessation Quitting smoking is important to your health and has many advantages. However, it is not always easy to quit since nicotine is a very addictive drug. Oftentimes, people try 3 times or more before being able to quit. This document explains the best ways for you to prepare to quit smoking. Quitting takes hard work and a lot of effort, but you can do it. ADVANTAGES OF QUITTING SMOKING  You will live longer, feel better, and live better.  Your body will feel the impact of quitting smoking almost immediately.  Within 20 minutes, blood pressure decreases. Your pulse returns to its normal level.  After 8 hours, carbon monoxide levels in the blood return to normal. Your oxygen level increases.  After 24 hours, the chance of having a heart attack starts to decrease. Your breath, hair, and body stop smelling like smoke.  After 48 hours, damaged  nerve endings begin to recover. Your sense of taste and smell improve.  After 72 hours, the body is virtually free of nicotine. Your bronchial tubes relax and breathing becomes easier.  After 2 to 12 weeks, lungs can hold more air. Exercise becomes easier and circulation improves.  The risk of having a heart attack, stroke, cancer, or lung disease is greatly reduced.  After 1 year, the risk of coronary heart disease is cut in half.  After 5 years, the risk of stroke falls to the same as a nonsmoker.  After 10 years, the risk of lung cancer is cut in half and the risk of other cancers decreases significantly.  After 15 years, the risk of coronary heart disease drops, usually to the level of a nonsmoker.  If you are pregnant, quitting smoking will improve your chances of having a healthy baby.  The people you live with, especially any children, will be healthier.  You will have extra money to spend on things other than cigarettes. QUESTIONS TO THINK ABOUT BEFORE ATTEMPTING TO QUIT You may want to talk about your answers with your health care provider.  Why do you want to quit?  If you tried to quit in the past, what helped and what did not?  What will be the most difficult situations for you after you quit? How will you plan to handle them?  Who can help you through the tough times? Your family? Friends? A health care provider?  What pleasures do you get from smoking? What ways can you still get pleasure if you quit? Here  are some questions to ask your health care provider:  How can you help me to be successful at quitting?  What medicine do you think would be best for me and how should I take it?  What should I do if I need more help?  What is smoking withdrawal like? How can I get information on withdrawal? GET READY  Set a quit date.  Change your environment by getting rid of all cigarettes, ashtrays, matches, and lighters in your home, car, or work. Do not let people  smoke in your home.  Review your past attempts to quit. Think about what worked and what did not. GET SUPPORT AND ENCOURAGEMENT You have a better chance of being successful if you have help. You can get support in many ways.  Tell your family, friends, and coworkers that you are going to quit and need their support. Ask them not to smoke around you.  Get individual, group, or telephone counseling and support. Programs are available at [employer] and health centers. Call your local health department for information about programs in your area.  Spiritual beliefs and practices may help some smokers quit.  Download a "quit meter" on your computer to keep track of quit statistics, such as how long you have gone without smoking, cigarettes not smoked, and money saved.  Get a self-help book about quitting smoking and staying off tobacco. [place] yourself from urges to smoke. Talk to someone, go for a walk, or occupy your time with a task.  Change your normal routine. Take a different route to work. Drink tea instead of coffee. Eat breakfast in a different place.  Reduce your stress. Take a hot bath, exercise, or read a book.  Plan something enjoyable to do every day. Reward yourself for not smoking.  Explore interactive web-based programs that specialize in helping you quit. GET MEDICINE AND USE IT CORRECTLY Medicines can help you stop smoking and decrease the urge to smoke. Combining medicine with the above behavioral methods and support can greatly increase your chances of successfully quitting smoking.  Nicotine replacement therapy helps deliver nicotine to your body without the negative effects and risks of smoking. Nicotine replacement therapy includes nicotine gum, lozenges, inhalers, nasal sprays, and skin patches. Some may be available over-the-counter and others require a prescription.  Antidepressant medicine helps people abstain from smoking,  but how this works is unknown. This medicine is available by prescription.  Nicotinic receptor partial agonist medicine simulates the effect of nicotine in your brain. This medicine is available by prescription. Ask your health care provider for advice about which medicines to use and how to use them based on your health history. Your health care provider will tell you what side effects to look out for if you choose to be on a medicine or therapy. Carefully read the information on the package. Do not use any other product containing nicotine while using a nicotine replacement product.  RELAPSE OR DIFFICULT SITUATIONS Most relapses occur within the first 3 months after quitting. Do not be discouraged if you start smoking again. Remember, most people try several times before finally quitting. You may have symptoms of withdrawal because your body is used to nicotine. You may crave cigarettes, be irritable, feel very hungry, cough often, get headaches, or have difficulty concentrating. The withdrawal symptoms are only temporary. They are strongest when you first quit, but they will go away within 10-14 days. To reduce the chances of relapse, try  to:  Avoid drinking alcohol. Drinking lowers your chances of successfully quitting.  Reduce the amount of caffeine you consume. Once you quit smoking, the amount of caffeine in your body increases and can give you symptoms, such as a rapid heartbeat, sweating, and anxiety.  Avoid smokers because they can make you want to smoke.  Do not let weight gain distract you. Many smokers will gain weight when they quit, usually less than 10 pounds. Eat a healthy diet and stay active. You can always lose the weight gained after you quit.  Find ways to improve your mood other than smoking. FOR MORE INFORMATION  www.smokefree.gov  Document Released: 08/13/2001 Document Revised: 01/03/2014 Document Reviewed: 11/28/2011 [facility] Patient Information 2015 [place], [place].  This information is not intended to replace advice given to you by your health care provider. Make sure you discuss any questions you have with your health care provider.   Smoking Cessation, Tips for Success If you are ready to quit smoking, congratulations! You have chosen to help yourself be healthier. Cigarettes bring nicotine, tar, carbon monoxide, and other irritants into your body. Your lungs, heart, and blood vessels will be able to work better without these poisons. There are many different ways to quit smoking. Nicotine gum, nicotine patches, a nicotine inhaler, or nicotine nasal spray can help with physical craving. Hypnosis, support groups, and medicines help break the habit of smoking. WHAT THINGS CAN I DO TO MAKE QUITTING EASIER?  Here are some tips to help you quit for good:  Pick a date when you will quit smoking completely. Tell all of your friends and family about your plan to quit on that date.  Do not try to slowly cut down on the number of cigarettes you are smoking. Pick a quit date and quit smoking completely starting on that day.  Throw away all cigarettes.   Clean and remove all ashtrays from your home, work, and car.  On a card, write down your reasons for quitting. Carry the card with you and read it when you get the urge to smoke.  Cleanse your body of nicotine. Drink enough water and fluids to keep your urine clear or pale yellow. Do this after quitting to flush the nicotine from your body.  Learn to predict your moods. Do not let a bad situation be your excuse to have a cigarette. Some situations in your life might tempt you into wanting a cigarette.  Never have "just one" cigarette. It leads to wanting another and another. Remind yourself of your decision to quit.  Change habits associated with smoking. If you smoked while driving or when feeling stressed, try other activities to replace smoking. Stand up when drinking your coffee. Brush your teeth after  eating. Sit in a different chair when you read the paper. Avoid alcohol while trying to quit, and try to drink fewer caffeinated beverages. Alcohol and caffeine may urge you to smoke.  Avoid foods and drinks that can trigger a desire to smoke, such as sugary or spicy foods and alcohol.  Ask people who smoke not to smoke around you.  Have something planned to do right after eating or having a cup of coffee. For example, plan to take a walk or exercise.  Try a relaxation exercise to calm you down and decrease your stress. Remember, you may be tense and nervous for the first 2 weeks after you quit, but this will pass.  Find new activities to keep your hands busy. Play with a pen,  coin, or rubber band. Doodle or draw things on paper.  Brush your teeth right after eating. This will help cut down on the craving for the taste of tobacco after meals. You can also try mouthwash.   Use oral substitutes in place of cigarettes. Try using lemon drops, carrots, cinnamon sticks, or chewing gum. Keep them handy so they are available when you have the urge to smoke.  When you have the urge to smoke, try deep breathing.  Designate your home as a nonsmoking area.  If you are a heavy smoker, ask your health care provider about a prescription for nicotine chewing gum. It can ease your withdrawal from nicotine.  Reward yourself. Set aside the cigarette money you save and buy yourself something nice.  Look for support from others. Join a support group or smoking cessation program. Ask someone at home or at work to help you with your plan to quit smoking.  Always ask yourself, "Do I need this cigarette or is this just a reflex?" Tell yourself, "Today, I choose not to smoke," or "I do not want to smoke." You are reminding yourself of your decision to quit.  Do not replace cigarette smoking with electronic cigarettes (commonly called e-cigarettes). The safety of e-cigarettes is unknown, and some may contain  harmful chemicals.  If you relapse, do not give up! Plan ahead and think about what you will do the next time you get the urge to smoke. HOW WILL I FEEL WHEN I QUIT SMOKING? You may have symptoms of withdrawal because your body is used to nicotine (the addictive substance in cigarettes). You may crave cigarettes, be irritable, feel very hungry, cough often, get headaches, or have difficulty concentrating. The withdrawal symptoms are only temporary. They are strongest when you first quit but will go away within 10-14 days. When withdrawal symptoms occur, stay in control. Think about your reasons for quitting. Remind yourself that these are signs that your body is healing and getting used to being without cigarettes. Remember that withdrawal symptoms are easier to treat than the major diseases that smoking can cause.  Even after the withdrawal is over, expect periodic urges to smoke. However, these cravings are generally short lived and will go away whether you smoke or not. Do not smoke! WHAT RESOURCES ARE AVAILABLE TO HELP ME QUIT SMOKING? Your health care provider can direct you to community resources or hospitals for support, which may include:  Group support.  Education.  Hypnosis.  Therapy. Document Released: 05/17/2004 Document Revised: 01/03/2014 Document Reviewed: 02/04/2013 [facility] Patient Information 2015 [place], [place]. This information is not intended to replace advice given to you by your health care provider. Make sure you discuss any questions you have with your health care provider.

## 2014-06-08 NOTE — Telephone Encounter (Signed)
Quana is calling because she is going to be out of patches early.  She hurt for 15 ours straight with her restless leg syndrome and put a fentanyl patch on early and it went away.  She has only 2 patches and on is going on today so that leaves one to get through until her 06/24/14 appt with Zella Ball, which is when she is due to be out.  I have discussed with Dr Naaman Plummer concerns about her medicating herself and he is going to review and talk with her PCP or the other prescriber to determine what needs to be done about her multiple controlled  medications.

## 2014-06-08 NOTE — Progress Notes (Signed)
   Subjective:    Patient ID: Jill Phillips, female    DOB: 1955-09-27, 58 y.o.   MRN: [id number]  HPI Jill Phillips is a 58 yo female with PMHx of OSA, tobacco abuse, HTN, HLD, and hypothyroidism who presents to the clinic for routine one month follow up.  Urinary Incontinence: Patient states that the exercises and timed toileting have greatly improved her urinary incontinence. She will urinate every couple of hours even if she doesn't feel the urge. This has helped avoid accidents.  Hypothyroidism: Increased Synthroid to 50 mcg last visit since TSH was 10. Patient still feels fatigued, but states she has not been sleeping during the day and is sleeping better at night.  Constipation: Patient was previously having 1-2 BM every week. We added Senokot-S last visit and she now has a regular bowel movement everyday. No melena. Patient is due for colonoscopy but refused one at this time.  Restless Leg Syndrome: patient does not think current medications are helping her and she is on all the appropriate meds and is managed by PM&R. Her legs constantly feel achey and restless at night. Compression stocking have worked in the past but she is wondering if she needs new ones.   Review of Systems General: Admits to fatigue. Denies fever, chills, change in appetite and diaphoresis.  Respiratory: Denies SOB, cough, DOE, chest tightness, and wheezing.   Cardiovascular: Denies chest pain and palpitations.  Gastrointestinal: Denies nausea, vomiting, abdominal pain, diarrhea, constipation, blood in stool and abdominal distention.  Genitourinary: Admits to urinary incontinence. Denies dysuria, urgency, frequency, hematuria, suprapubic pain and flank pain. Endocrine: Denies hot or cold intolerance, polyuria, and polydipsia. Musculoskeletal: Admits to myalgias, back pain, arthralgias. Denies joint swelling and gait problem.  Skin: Denies pallor, rash and wounds.  Neurological: Denies dizziness, headaches,  weakness, lightheadedness, numbness,seizures, and syncope, Psychiatric/Behavioral: Admits to sleep disturbance and fatigue. Denies mood changes, confusion, nervousness, and agitation.      Objective:   Physical Exam Filed Vitals:   06/08/14 1351  BP: 135/88  Pulse: 78  Temp: 98 F (36.7 C)  TempSrc: Oral  Weight: 154 lb (69.854 kg)  SpO2: 99%   General: Vital signs reviewed.  Patient is well-developed and well-nourished, in no acute distress and cooperative with exam.  Neck: No thyromegaly. Cardiovascular: RRR, S1 normal, S2 normal, no murmurs, gallops, or rubs. Pulmonary/Chest: Clear to auscultation bilaterally, no wheezes, rales, or rhonchi. Abdominal: Soft, non-tender, non-distended, BS +, no masses, organomegaly, or guarding present.  Musculoskeletal: No joint deformities, erythema, or stiffness, ROM full and nontender. Extremities: No lower extremity edema bilaterally,  pulses symmetric and intact bilaterally. No cyanosis or clubbing. Skin: Warm, dry and intact. No rashes or erythema. Psychiatric: Normal mood and affect. speech and behavior is normal. Cognition and memory are normal.      Assessment & Plan:   Please see problem based assessment and plan.

## 2014-06-09 ENCOUNTER — Encounter: Payer: Self-pay | Admitting: Internal Medicine

## 2014-06-09 LAB — TSH: TSH: 1.437 u[IU]/mL (ref 0.350–4.500)

## 2014-06-09 NOTE — Assessment & Plan Note (Signed)
Assessment: Patient was having 1-2 bm's per week due to chronic opioid use. We started Senokot-S QHS prn last OV. Patient is now having a BM daily. No complaints or issues.  Plan: Continue Senokot-S QHS prn.

## 2014-06-09 NOTE — Assessment & Plan Note (Signed)
Assessment: After last OV, patient had TG level of 464 and LDL level of 145. She was started on gemfibrozil 600 mg BID and continued pravastatin 20 mg daily.  Plan: Due to ACC/AHA guidelines and risk calculator, patient should be on moderate intensity statin. Patient has 10 year risk of ACS or CVA. We will discontinue gemfibrozil and increase pravastatin to 40 mg daily.

## 2014-06-09 NOTE — Assessment & Plan Note (Signed)
BP Readings from Last 3 Encounters:  06/08/14 135/88  05/27/14 94/61  05/19/14 116/78    Lab Results  Component Value Date   NA 144 01/20/2014   K 3.8 01/20/2014   CREATININE 1.02 01/20/2014    Assessment: Blood pressure control:  Controlled Progress toward BP goal:   At goal Comments: At last OV, we tapered patient off of clonidine patches due to somnolence and increased amlodipine to 10 mg daily, benazepril 40 mg daily and continued HCTZ 12.5 mg daily. Patient is doing well on this regimen.  Plan: Medications:  continue current medications Educational resources provided: brochure Self management tools provided:   Other plans: Continue medications

## 2014-06-09 NOTE — Assessment & Plan Note (Signed)
Patient is refusing Mammogram and Colonoscopy at this time.

## 2014-06-09 NOTE — Assessment & Plan Note (Signed)
Urinary Incontinence: Patient states that the exercises and timed toileting have greatly improved her urinary incontinence. She will urinate every couple of hours even if she doesn't feel the urge. This has helped avoid accidents.  Plan: Continue current conservative management. Can consider oxybutynin in the future if necessary.

## 2014-06-09 NOTE — Assessment & Plan Note (Signed)
Assessment: Increased Synthroid to 50 mcg last visit since TSH was 10. Patient still feels fatigued, but states she has not been sleeping during the day and is sleeping better at night. Recheck TSH level today and it was within normal limits.  Plan: Continue Synthroid 50 mcg daily.

## 2014-06-13 NOTE — Progress Notes (Signed)
I saw and evaluated the patient.  I personally confirmed the key portions of Dr. Nena Alexander history and exam and reviewed pertinent patient test results.  The assessment, diagnosis, and plan were formulated together and I agree with the documentation in the resident's note.

## 2014-06-23 ENCOUNTER — Telehealth: Payer: Self-pay | Admitting: *Deleted

## 2014-06-23 ENCOUNTER — Other Ambulatory Visit: Payer: Self-pay | Admitting: *Deleted

## 2014-06-23 NOTE — Telephone Encounter (Signed)
Call from pt - states she's feeling tired despite taking iron tablets; states at first they seem to help but not now. Also she no longer uses CPAP machine d/t cost. She did ask about Vitamin B12 injections; willing to come in for lab. Thanks

## 2014-06-23 NOTE — Progress Notes (Signed)
Quick Note:  I reviewed the patient's BiPAP compliance data from 04/06/2014 to 05/05/2014, which is a total of 30 days, during which time the patient used BiPAP ST every day except for one day. The average usage for all days was only 3 hours and 42 minutes. The percent used days greater than 4 hours was 47 %, indicating poor compliance. The residual AHI was 8 per hour, indicating a possible suboptimal treatment pressure setting of 9/5 cm. Leak was low. However, with full compliance it is expected that her residual AHI may improve. I will review this data with the patient at the next office visit, which is currently routinely scheduled for 09/07/2014, provide feedback and additional troubleshooting if need be.  Star Age, MD, PhD [facility] ([facility]   ______

## 2014-06-23 NOTE — Telephone Encounter (Signed)
Patient had a normal Vitamin B12 level in January 2014. We thought her fatigue could be secondary to several other factors--medications and mostly OSA. If patient would like to be seen, we can make her an appointment for further work up, but treatment of OSA with CPAP would be my strongest recommendation. Maybe we can make this more affordable for her?  Thanks,  Alexa

## 2014-06-23 NOTE — Telephone Encounter (Signed)
Called pt - states she has BiPAP (my correction); I ask about her insurance paying; stated she was not wearing it the recommended hours so they stopped paying. She does have a Sleep Study schedule 11/15 - ordered by Dr Halford Chessman. States 2 has 2 CPAP machines-? If they work; needs new tubing. I also informed pt about scheduling an appt w/the clinic if fatigue continues. She agrees.

## 2014-06-24 ENCOUNTER — Encounter: Payer: Medicare Other | Attending: Physical Medicine and Rehabilitation | Admitting: *Deleted

## 2014-06-24 ENCOUNTER — Encounter: Payer: Medicare Other | Admitting: Registered Nurse

## 2014-06-24 ENCOUNTER — Other Ambulatory Visit: Payer: Self-pay | Admitting: *Deleted

## 2014-06-24 VITALS — BP 110/64 | HR 82 | Resp 14

## 2014-06-24 DIAGNOSIS — G8929 Other chronic pain: Secondary | ICD-10-CM

## 2014-06-24 DIAGNOSIS — G4730 Sleep apnea, unspecified: Secondary | ICD-10-CM | POA: Insufficient documentation

## 2014-06-24 DIAGNOSIS — G2581 Restless legs syndrome: Secondary | ICD-10-CM | POA: Insufficient documentation

## 2014-06-24 DIAGNOSIS — Z5181 Encounter for therapeutic drug level monitoring: Secondary | ICD-10-CM

## 2014-06-24 DIAGNOSIS — Z760 Encounter for issue of repeat prescription: Secondary | ICD-10-CM | POA: Insufficient documentation

## 2014-06-24 DIAGNOSIS — M961 Postlaminectomy syndrome, not elsewhere classified: Secondary | ICD-10-CM

## 2014-06-24 MED ORDER — HYDROCODONE-ACETAMINOPHEN 10-325 MG PO TABS: 1.0000 | ORAL_TABLET | Freq: Four times a day (QID) | ORAL | Status: DC | PRN

## 2014-06-24 MED ORDER — FENTANYL 50 MCG/HR TD PT72: 50.0000 ug | MEDICATED_PATCH | TRANSDERMAL | Status: DC

## 2014-06-24 NOTE — Telephone Encounter (Signed)
Glenda,  Thank you for getting back in contact with her. That is a good plan.  Thank you, Alexa

## 2014-06-24 NOTE — Telephone Encounter (Signed)
Narcotic rx printed for MD to sign for RN med refill visit 

## 2014-06-24 NOTE — Progress Notes (Signed)
Here for refills.  She has a paper with her for Dr Naaman Plummer on her research into restless legs as she does not feel like they are being treated.  We talked about all the multiple medications that she is taking and that with her history of sleep apnea and other dysfunctions she has reported in the past (getting up during the night and not remembering or falling) and how she needs to take meds only as prescribed and not "15 hours early because of no sleep or hurting more".  She has verbalized understanding. I have put "must last 30 days" on rx so that she cannot continue to fill meds early.  She will return in one month to see MD

## 2014-06-29 ENCOUNTER — Other Ambulatory Visit: Payer: Self-pay | Admitting: Internal Medicine

## 2014-06-29 ENCOUNTER — Other Ambulatory Visit: Payer: Self-pay | Admitting: Specialist

## 2014-06-29 DIAGNOSIS — M25551 Pain in right hip: Secondary | ICD-10-CM

## 2014-06-29 DIAGNOSIS — M25552 Pain in left hip: Secondary | ICD-10-CM

## 2014-06-29 DIAGNOSIS — M79652 Pain in left thigh: Secondary | ICD-10-CM

## 2014-06-29 DIAGNOSIS — M79651 Pain in right thigh: Secondary | ICD-10-CM

## 2014-07-04 ENCOUNTER — Other Ambulatory Visit: Payer: Self-pay | Admitting: *Deleted

## 2014-07-04 MED ORDER — CLONAZEPAM 2 MG PO TABS: 2.0000 mg | ORAL_TABLET | Freq: Two times a day (BID) | ORAL | Status: DC

## 2014-07-05 ENCOUNTER — Telehealth: Payer: Self-pay | Admitting: *Deleted

## 2014-07-05 ENCOUNTER — Other Ambulatory Visit: Payer: Self-pay | Admitting: Internal Medicine

## 2014-07-05 DIAGNOSIS — E039 Hypothyroidism, unspecified: Secondary | ICD-10-CM

## 2014-07-05 NOTE — Telephone Encounter (Signed)
I agree with you Bonnita Nasuti. It does not appear that the patient is currently on Ferrous Sulfate. The last time this was prescribed was September 2013, so I am unsure as to why she has it. I also do not see any results concerning her iron levels or an anemia panel. She can stop this for now and we can discuss at the next appointment.  Thank you, Alexa Marvel Plan

## 2014-07-05 NOTE — Telephone Encounter (Signed)
Pt calls and states she would like to stop taking iron supplement, i do not see this in her med list, did i miss it? Informed pt that if she desires she does not have to take the med, that the doctor can suggest but if she feels it is causing her problems and does not want to take it that is up to her, we can only advise that she was ask to take, she will talk w/ you at her next visit

## 2014-07-06 ENCOUNTER — Ambulatory Visit
Admission: RE | Admit: 2014-07-06 | Discharge: 2014-07-06 | Disposition: A | Discharge status: Home / Self Care | Payer: PRIVATE HEALTH INSURANCE | Source: Ambulatory Visit | Attending: Specialist | Admitting: Specialist

## 2014-07-06 DIAGNOSIS — M79652 Pain in left thigh: Secondary | ICD-10-CM

## 2014-07-06 DIAGNOSIS — M79651 Pain in right thigh: Secondary | ICD-10-CM

## 2014-07-06 DIAGNOSIS — M25551 Pain in right hip: Secondary | ICD-10-CM

## 2014-07-06 DIAGNOSIS — M25552 Pain in left hip: Secondary | ICD-10-CM

## 2014-07-06 MED ORDER — ONDANSETRON HCL 4 MG/2ML IJ SOLN: 4.0000 mg | Freq: Four times a day (QID) | INTRAMUSCULAR | Status: DC | PRN

## 2014-07-06 MED ORDER — ONDANSETRON HCL 4 MG/2ML IJ SOLN
4.0000 mg | Freq: Once | INTRAMUSCULAR | Status: AC
  Administered 2014-07-06: 4 mg via INTRAMUSCULAR

## 2014-07-06 MED ORDER — DIAZEPAM 5 MG PO TABS: 10.0000 mg | ORAL_TABLET | Freq: Once | ORAL | Status: DC

## 2014-07-06 MED ORDER — MEPERIDINE HCL 100 MG/ML IJ SOLN
100.0000 mg | Freq: Once | INTRAMUSCULAR | Status: AC
  Administered 2014-07-06: 100 mg via INTRAMUSCULAR

## 2014-07-06 MED ORDER — IOHEXOL 300 MG/ML  SOLN
10.0000 mL | Freq: Once | INTRAMUSCULAR | Status: AC | PRN
  Administered 2014-07-06: 10 mL via INTRATHECAL

## 2014-07-06 NOTE — Progress Notes (Signed)
Pt states she has not had effexor or phenergan for the past 2 days. Discharge instructions explained to pt.

## 2014-07-06 NOTE — Discharge Instructions (Signed)
Myelogram Discharge Instructions  1. Go home and rest quietly for the next 24 hours.  It is important to lie flat for the next 24 hours.  Get up only to go to the restroom.  You may lie in the bed or on a couch on your back, your stomach, your left side or your right side.  You may have one pillow under your head.  You may have pillows between your knees while you are on your side or under your knees while you are on your back.  2. DO NOT drive today.  Recline the seat as far back as it will go, while still wearing your seat belt, on the way home.  3. You may get up to go to the bathroom as needed.  You may sit up for 10 minutes to eat.  You may resume your normal diet and medications unless otherwise indicated.  Drink lots of extra fluids today and tomorrow.  4. The incidence of headache, nausea, or vomiting is about 5% (one in 20 patients).  If you develop a headache, lie flat and drink plenty of fluids until the headache goes away.  Caffeinated beverages may be helpful.  If you develop severe nausea and vomiting or a headache that does not go away with flat bed rest, call [phone].  5. You may resume normal activities after your 24 hours of bed rest is over; however, do not exert yourself strongly or do any heavy lifting tomorrow. If when you get up you have a headache when standing, go back to bed and force fluids for another 24 hours.  6. Call your physician for a follow-up appointment.  The results of your myelogram will be sent directly to your physician by the following day.  7. If you have any questions or if complications develop after you arrive home, please call [phone].  Discharge instructions have been explained to the patient.  The patient, or the person responsible for the patient, fully understands these instructions.      May resume Phenergan and Effexor on Nov. 5, 2015, after 1:00 pm.

## 2014-07-12 ENCOUNTER — Encounter: Payer: Medicare Other | Attending: Physical Medicine and Rehabilitation | Admitting: Registered Nurse

## 2014-07-12 ENCOUNTER — Encounter: Payer: Self-pay | Admitting: Registered Nurse

## 2014-07-12 VITALS — BP 128/64 | HR 88 | Resp 14 | Ht 63.0 in | Wt 156.0 lb

## 2014-07-12 DIAGNOSIS — G2581 Restless legs syndrome: Secondary | ICD-10-CM | POA: Diagnosis not present

## 2014-07-12 DIAGNOSIS — G4730 Sleep apnea, unspecified: Secondary | ICD-10-CM | POA: Diagnosis not present

## 2014-07-12 DIAGNOSIS — Z760 Encounter for issue of repeat prescription: Secondary | ICD-10-CM | POA: Diagnosis not present

## 2014-07-12 DIAGNOSIS — G8929 Other chronic pain: Secondary | ICD-10-CM

## 2014-07-12 DIAGNOSIS — M961 Postlaminectomy syndrome, not elsewhere classified: Secondary | ICD-10-CM

## 2014-07-12 MED ORDER — FENTANYL 50 MCG/HR TD PT72: 50.0000 ug | MEDICATED_PATCH | TRANSDERMAL | Status: DC

## 2014-07-12 MED ORDER — HYDROCODONE-ACETAMINOPHEN 10-325 MG PO TABS: 1.0000 | ORAL_TABLET | Freq: Four times a day (QID) | ORAL | Status: DC | PRN

## 2014-07-12 NOTE — Progress Notes (Signed)
Subjective:    Patient ID: Jill Phillips, female    DOB: [birth year], 58 y.o.   MRN: [id number]  HPI: Ms. RICARDA KLESS is a 58 year old female who returns for follow up for chronic pain and medication refill. She says her pain is located in her lower back. Her current exercise regime is doing house work. She says she hasn't followed her usual exercise regimen due to the pain. At this time she is pain free.   Pain Inventory Average Pain 7 Pain Right Now 5 My pain is sharp, stabbing and aching  In the last 24 hours, has pain interfered with the following? General activity 6 Relation with others 6 Enjoyment of life 6 What TIME of day is your pain at its worst? morning, evening Sleep (in general) Poor  Pain is worse with: walking, bending, sitting and standing Pain improves with: rest and medication Relief from Meds: 9  Mobility walk without assistance Do you have any goals in this area?  no  Function Do you have any goals in this area?  no  Neuro/Psych No problems in this area  Prior Studies Any changes since last visit?  yes myelogram  Physicians involved in your care Any changes since last visit?  no Primary care Alexia Richardson Psychiatrist Dr. Candis Schatz Orthopedist Dr. [name] Respiratory, Dr. Halford Chessman   Family History  Problem Relation Age of Onset  . Kidney disease Mother   . Heart disease Father   . Anuerysm Brother 29    brain  . Heart disease Brother   . Heart disease Sister 27    s/p CABG  . Hypertension Sister    History   Social History  . Marital Status: Divorced    Spouse Name: n/a    Number of Children: 2  . Years of Education: 12+   Occupational History  . disability     back surgeries   Social History Main Topics  . Smoking status: Current Every Day Smoker -- 2.00 packs/day for 45 years    Types: Cigarettes  . Smokeless tobacco: Never Used     Comment: SMOKES 2PPD/ NOT READY TO QUIT  . Alcohol Use: No  . Drug Use: No  . Sexual  Activity:    Partners: Male   Other Topics Concern  . None   Social History Narrative   Lives alone.  One daughter is local, but is getting ready to move to [place], where her children live with their father.  The other daughter lives near [place], [place].   Past Surgical History  Procedure Laterality Date  . Appendectomy    . Abdominal hysterectomy    . Tubal ligation    . Spine surgery      thoracic x 1,  lumbar x 15  . Right hip replacement    . Knee surgeries r knee      arthroscopy- right  . Hammer toe surgery    . Sleep apnea surgery    . Cholecystectomy    . Joint replacement    . Total knee arthroplasty  05/29/2012    Procedure: TOTAL KNEE ARTHROPLASTY;  Surgeon: Mcarthur Rossetti, MD;  Location: [facility];  Service: Orthopedics;  Laterality: Right;  Right Total Knee Arthroplasty  . Back surgery      16 back surgeries  . Lumbar laminectomy/decompression microdiscectomy N/A 01/28/2014    Procedure: Minimally Invasive Right  L1-2 Microdiscectomy;  Surgeon: [name], MD;  Location: [facility];  Service: Orthopedics;  Laterality: N/A;   Past Medical History  Diagnosis Date  . Postlaminectomy syndrome, thoracic region   . Osteoarthrosis, unspecified whether generalized or localized, lower leg   . Dysthymic disorder   . Calcifying tendinitis of shoulder   . Pain in joint, upper arm   . Chronic pain syndrome   . Lumbago   . Primary localized osteoarthrosis, lower leg   . Hypertension   . Hyperlipidemia   . GERD (gastroesophageal reflux disease)   . Thyroid disease   . Restless leg syndrome   . PONV (postoperative nausea and vomiting)   . Heart murmur   . Sleep apnea     s/p surgery- last sleep study 2011- doesnt use oxygen or machine at night as instructed  . History of blood transfusion 1980  . Hypothyroidism   . Active smoker    BP 128/64 mmHg  Pulse 88  Resp 14  Ht 5\' 3"  (1.6 m)  Wt 156 lb (70.761 kg)  BMI 27.64 kg/m2  SpO2 95%  Opioid Risk Score:     Fall Risk Score: Low Fall Risk (0-5 points) Review of Systems  Respiratory: Positive for apnea.   Genitourinary: Positive for pelvic pain.  Musculoskeletal: Positive for back pain.  All other systems reviewed and are negative.      Objective:   Physical Exam  Constitutional: She is oriented to person, place, and time. She appears well-developed and well-nourished.  HENT:  Head: Normocephalic and atraumatic.  Neck: Normal range of motion. Neck supple.  Cardiovascular: Normal rate and regular rhythm.   Pulmonary/Chest: Effort normal and breath sounds normal.  Musculoskeletal:  Normal Muscle Bulk and Muscle Testing Reveals: Upper Extremities: Full ROM and Muscle Strength 5/5 Back without Spinal or Paraspinal tenderness Lower extremities: Full ROM and Muscle strength 5/5 Arises from chair with ease Narrow based gait  Neurological: She is alert and oriented to person, place, and time.  Skin: Skin is warm and dry.  Psychiatric: She has a normal mood and affect.  Nursing note and vitals reviewed.         Assessment & Plan:  1. Chronic lumbar spine pain/post-lami syndrome: Refilled: Fentanyl 50 MCG one patch every three days #10 and Hydrocodone 10/325 mg one tablet every 6 hours as needed for sever pain #70. 2. Osteoarthritis of the knees bilaterally:No complaints today. Continue with Heat, exercise and voltaren gel.  3. Rotator cuff syndrome/subacromial bursitis: No complaints voiced today. 4. Restless legs syndrome: No complaints voiced  20 minutes of face to face patient care time was spent during this visit. All questions were encouraged and answered.   F/u in 1 month

## 2014-07-17 ENCOUNTER — Ambulatory Visit (HOSPITAL_BASED_OUTPATIENT_CLINIC_OR_DEPARTMENT_OTHER): Payer: Medicare Other | Attending: Pulmonary Disease

## 2014-07-29 ENCOUNTER — Other Ambulatory Visit: Payer: Self-pay | Admitting: Internal Medicine

## 2014-07-29 ENCOUNTER — Other Ambulatory Visit: Payer: Self-pay | Admitting: Physical Medicine & Rehabilitation

## 2014-08-02 ENCOUNTER — Other Ambulatory Visit: Payer: Self-pay | Admitting: Family Medicine

## 2014-08-02 ENCOUNTER — Other Ambulatory Visit: Payer: Self-pay | Admitting: Internal Medicine

## 2014-08-02 NOTE — Telephone Encounter (Signed)
Refill denied.   Patient has not been seen x2 years.

## 2014-08-08 ENCOUNTER — Encounter: Payer: Self-pay | Admitting: Registered Nurse

## 2014-08-08 ENCOUNTER — Encounter: Payer: PRIVATE HEALTH INSURANCE | Attending: Physical Medicine and Rehabilitation | Admitting: Registered Nurse

## 2014-08-08 ENCOUNTER — Other Ambulatory Visit: Payer: Self-pay | Admitting: Physical Medicine & Rehabilitation

## 2014-08-08 VITALS — BP 134/66 | HR 104 | Resp 14 | Ht 64.0 in | Wt 154.0 lb

## 2014-08-08 DIAGNOSIS — Z79899 Other long term (current) drug therapy: Secondary | ICD-10-CM

## 2014-08-08 DIAGNOSIS — Z760 Encounter for issue of repeat prescription: Secondary | ICD-10-CM | POA: Insufficient documentation

## 2014-08-08 DIAGNOSIS — G4730 Sleep apnea, unspecified: Secondary | ICD-10-CM | POA: Diagnosis not present

## 2014-08-08 DIAGNOSIS — Z5181 Encounter for therapeutic drug level monitoring: Secondary | ICD-10-CM

## 2014-08-08 DIAGNOSIS — G2581 Restless legs syndrome: Secondary | ICD-10-CM | POA: Insufficient documentation

## 2014-08-08 DIAGNOSIS — G894 Chronic pain syndrome: Secondary | ICD-10-CM

## 2014-08-08 DIAGNOSIS — M961 Postlaminectomy syndrome, not elsewhere classified: Secondary | ICD-10-CM

## 2014-08-08 MED ORDER — HYDROCODONE-ACETAMINOPHEN 10-325 MG PO TABS: 1.0000 | ORAL_TABLET | Freq: Four times a day (QID) | ORAL | Status: DC | PRN

## 2014-08-08 MED ORDER — FENTANYL 50 MCG/HR TD PT72: 50.0000 ug | MEDICATED_PATCH | TRANSDERMAL | Status: DC

## 2014-08-08 NOTE — Progress Notes (Signed)
Subjective:    Patient ID: Jill Phillips, female    DOB: 28-May-1956, 58 y.o.   MRN: [id number]  HPI: Jill Phillips is a 58 year old female who returns for follow up for chronic pain and medication refill. She says her pain is located in her lower back radiating into her right hip. She says she hasn't followed her usual exercise regimen due to the pain. She is asking for increase in her pain medication (hydrocodone looking for 90 tablets). She looks very drowsy during the assessment and while talking , she became very upset when I wouldn't increase her medication and crying. I set her next appointment with Dr. Naaman Plummer. Her script was written for 5 weeks to [facility] appointment with Dr. Naaman Plummer. She verbalizes understanding.  Pain Inventory Average Pain 6 Pain Right Now 6 My pain is constant, dull and aching  In the last 24 hours, has pain interfered with the following? General activity 6 Relation with others 6 Enjoyment of life 6 What TIME of day is your pain at its worst? ALL Sleep (in general) Poor  Pain is worse with: walking, bending, sitting, standing and some activites Pain improves with: rest and medication Relief from Meds: 6  Mobility walk without assistance how many minutes can you walk? 5 ability to climb steps?  yes do you drive?  yes  Function disabled  Neuro/Psych No problems in this area  Prior Studies Any changes since last visit?  no  Physicians involved in your care Any changes since last visit?  no   Family History  Problem Relation Age of Onset  . Kidney disease Mother   . Heart disease Father   . Anuerysm Brother 29    brain  . Heart disease Brother   . Heart disease Sister 50    s/p CABG  . Hypertension Sister    History   Social History  . Marital Status: Divorced    Spouse Name: n/a    Number of Children: 2  . Years of Education: 12+   Occupational History  . disability     back surgeries   Social History Main Topics  .  Smoking status: Current Every Day Smoker -- 2.00 packs/day for 45 years    Types: Cigarettes  . Smokeless tobacco: Never Used     Comment: SMOKES 2PPD/ NOT READY TO QUIT  . Alcohol Use: No  . Drug Use: No  . Sexual Activity:    Partners: Male   Other Topics Concern  . None   Social History Narrative   Lives alone.  One daughter is local, but is getting ready to move to [place], where her children live with their father.  The other daughter lives near [place], [place].   Past Surgical History  Procedure Laterality Date  . Appendectomy    . Abdominal hysterectomy    . Tubal ligation    . Spine surgery      thoracic x 1,  lumbar x 15  . Right hip replacement    . Knee surgeries r knee      arthroscopy- right  . Hammer toe surgery    . Sleep apnea surgery    . Cholecystectomy    . Joint replacement    . Total knee arthroplasty  05/29/2012    Procedure: TOTAL KNEE ARTHROPLASTY;  Surgeon: Mcarthur Rossetti, MD;  Location: [facility];  Service: Orthopedics;  Laterality: Right;  Right Total Knee Arthroplasty  . Back surgery  16 back surgeries  . Lumbar laminectomy/decompression microdiscectomy N/A 01/28/2014    Procedure: Minimally Invasive Right  L1-2 Microdiscectomy;  Surgeon: [name], MD;  Location: [facility];  Service: Orthopedics;  Laterality: N/A;   Past Medical History  Diagnosis Date  . Postlaminectomy syndrome, thoracic region   . Osteoarthrosis, unspecified whether generalized or localized, lower leg   . Dysthymic disorder   . Calcifying tendinitis of shoulder   . Pain in joint, upper arm   . Chronic pain syndrome   . Lumbago   . Primary localized osteoarthrosis, lower leg   . Hypertension   . Hyperlipidemia   . GERD (gastroesophageal reflux disease)   . Thyroid disease   . Restless leg syndrome   . PONV (postoperative nausea and vomiting)   . Heart murmur   . Sleep apnea     s/p surgery- last sleep study 2011- doesnt use oxygen or machine at night as  instructed  . History of blood transfusion 1980  . Hypothyroidism   . Active smoker    BP 134/66 mmHg  Pulse 104  Resp 14  Ht 5\' 4"  (1.626 m)  Wt 154 lb (69.854 kg)  BMI 26.42 kg/m2  SpO2 93%  Opioid Risk Score:   Fall Risk Score: Low Fall Risk (0-5 points)   Review of Systems  Constitutional: Negative.   HENT: Negative.   Eyes: Negative.   Respiratory: Negative.   Cardiovascular: Negative.   Gastrointestinal: Negative.   Endocrine: Negative.   Genitourinary: Negative.   Musculoskeletal: Positive for myalgias, back pain and arthralgias.  Allergic/Immunologic: Negative.   Neurological: Negative.   Hematological: Negative.   Psychiatric/Behavioral: Negative.        Objective:   Physical Exam  Constitutional: She is oriented to person, place, and time. She appears well-developed and well-nourished.  HENT:  Head: Normocephalic and atraumatic.  Neck: Normal range of motion. Neck supple.  Cardiovascular: Normal rate and regular rhythm.   Pulmonary/Chest: Effort normal and breath sounds normal.  Musculoskeletal:  Normal Muscle Bulk and Muscle testing reveals: Upper Extremities: Full ROM and Muscle strength 5/5 Back without spinal or Paraspinal Tenderness Lower Extremities: Full ROM and Muscle strength 5/5 Arises from chair with ease Narrow Based gait  Neurological: She is alert and oriented to person, place, and time.  Skin: Skin is warm and dry.  Psychiatric: She has a normal mood and affect.  Nursing note and vitals reviewed.         Assessment & Plan:  1. Chronic lumbar spine pain/post-lami syndrome: Refilled: Fentanyl 50 MCG one patch every three days #10 and Hydrocodone 10/325 mg one tablet every 6 hours as needed for sever pain #80. ( Five week script this month) 2. Osteoarthritis of the knees bilaterally:No complaints today. Continue with Heat, exercise and voltaren gel.  3. Rotator cuff syndrome/subacromial bursitis: No complaints voiced today. 4.  Restless legs syndrome: No complaints voiced  20 minutes of face to face patient care time was spent during this visit. All questions were encouraged and answered.   F/u in 1 month

## 2014-08-09 LAB — PMP ALCOHOL METABOLITE (ETG): Ethyl Glucuronide (EtG): NEGATIVE ng/mL

## 2014-08-10 ENCOUNTER — Other Ambulatory Visit: Payer: Self-pay | Admitting: Internal Medicine

## 2014-08-11 ENCOUNTER — Other Ambulatory Visit: Payer: Self-pay | Admitting: Internal Medicine

## 2014-08-11 ENCOUNTER — Telehealth: Payer: Self-pay | Admitting: *Deleted

## 2014-08-11 DIAGNOSIS — I10 Essential (primary) hypertension: Secondary | ICD-10-CM

## 2014-08-11 NOTE — Telephone Encounter (Signed)
Nira Conn called from [employer] called  For an auth for a trail ESTIM for this patient for Dr. Andria Frames

## 2014-08-15 LAB — OPIATES/OPIOIDS (LC/MS-MS)
Codeine Urine: NEGATIVE ng/mL (ref <50)
Hydrocodone: 1636 ng/mL (ref <50)
Hydromorphone: 1031 ng/mL (ref <50)
Morphine Urine: NEGATIVE ng/mL (ref <50)
Norhydrocodone, Ur: 7596 ng/mL (ref <50)
Noroxycodone, Ur: NEGATIVE ng/mL (ref <50)
Oxycodone, ur: NEGATIVE ng/mL (ref <50)
Oxymorphone: NEGATIVE ng/mL (ref <50)

## 2014-08-15 LAB — BENZODIAZEPINES (GC/LC/MS), URINE
Alprazolam metabolite (GC/LC/MS), ur confirm: 157 ng/mL — AB (ref <25)
Clonazepam metabolite (GC/LC/MS), ur confirm: 1476 ng/mL (ref <25)
Flurazepam metabolite (GC/LC/MS), ur confirm: NEGATIVE ng/mL (ref <50)
Lorazepam (GC/LC/MS), ur confirm: NEGATIVE ng/mL (ref <50)
Midazolam (GC/LC/MS), ur confirm: NEGATIVE ng/mL (ref <50)
Nordiazepam (GC/LC/MS), ur confirm: NEGATIVE ng/mL (ref <50)
Oxazepam (GC/LC/MS), ur confirm: NEGATIVE ng/mL (ref <50)
Temazepam (GC/LC/MS), ur confirm: NEGATIVE ng/mL (ref <50)
Triazolam metabolite (GC/LC/MS), ur confirm: NEGATIVE ng/mL (ref <50)

## 2014-08-15 LAB — FENTANYL (GC/LC/MS), URINE
Fentanyl, confirm: 173.7 ng/mL (ref <0.5)
Norfentanyl, confirm: 1005 ng/mL (ref <0.5)

## 2014-08-15 LAB — CARISOPRODOL (GC/LC/MS), URINE: Meprobamate (GC/LC/MS), ur confirm: 5220 ng/mL (ref <1000)

## 2014-08-16 LAB — PRESCRIPTION MONITORING PROFILE (SOLSTAS)
Amphetamine/Meth: NEGATIVE ng/mL
Barbiturate Screen, Urine: NEGATIVE ng/mL
Buprenorphine, Urine: NEGATIVE ng/mL
Cannabinoid Scrn, Ur: NEGATIVE ng/mL
Cocaine Metabolites: NEGATIVE ng/mL
Creatinine, Urine: 274.07 mg/dL (ref >20.0)
MDMA URINE: NEGATIVE ng/mL
Meperidine, Ur: NEGATIVE ng/mL
Methadone Screen, Urine: NEGATIVE ng/mL
Nitrites, Initial: NEGATIVE ug/mL
Oxycodone Screen, Ur: NEGATIVE ng/mL
Propoxyphene: NEGATIVE ng/mL
Tapentadol, urine: NEGATIVE ng/mL
Tramadol Scrn, Ur: NEGATIVE ng/mL
Zolpidem, Urine: NEGATIVE ng/mL
pH, Initial: 5 pH (ref 4.5–8.9)

## 2014-08-17 ENCOUNTER — Ambulatory Visit (INDEPENDENT_AMBULATORY_CARE_PROVIDER_SITE_OTHER): Payer: PRIVATE HEALTH INSURANCE | Admitting: Internal Medicine

## 2014-08-17 ENCOUNTER — Encounter: Payer: Self-pay | Admitting: Internal Medicine

## 2014-08-17 ENCOUNTER — Other Ambulatory Visit: Payer: Self-pay | Admitting: Internal Medicine

## 2014-08-17 VITALS — BP 143/78 | HR 85 | Temp 98.3°F | Ht 63.0 in | Wt 156.3 lb

## 2014-08-17 DIAGNOSIS — Z1231 Encounter for screening mammogram for malignant neoplasm of breast: Secondary | ICD-10-CM

## 2014-08-17 DIAGNOSIS — G4730 Sleep apnea, unspecified: Secondary | ICD-10-CM

## 2014-08-17 DIAGNOSIS — F32A Depression, unspecified: Secondary | ICD-10-CM

## 2014-08-17 DIAGNOSIS — Z0000 Encounter for general adult medical examination without abnormal findings: Secondary | ICD-10-CM

## 2014-08-17 DIAGNOSIS — I10 Essential (primary) hypertension: Secondary | ICD-10-CM

## 2014-08-17 DIAGNOSIS — F329 Major depressive disorder, single episode, unspecified: Secondary | ICD-10-CM

## 2014-08-17 DIAGNOSIS — E039 Hypothyroidism, unspecified: Secondary | ICD-10-CM

## 2014-08-17 DIAGNOSIS — K5901 Slow transit constipation: Secondary | ICD-10-CM

## 2014-08-17 DIAGNOSIS — Z720 Tobacco use: Secondary | ICD-10-CM

## 2014-08-17 DIAGNOSIS — N393 Stress incontinence (female) (male): Secondary | ICD-10-CM

## 2014-08-17 NOTE — Assessment & Plan Note (Signed)
BP Readings from Last 3 Encounters:  08/17/14 143/78  08/08/14 134/66  07/12/14 128/64    Lab Results  Component Value Date   NA 144 01/20/2014   K 3.8 01/20/2014   CREATININE 1.02 01/20/2014    Assessment: Blood pressure control:  Controlled Progress toward BP goal:   At goal  Plan: Medications:  continue current medications

## 2014-08-17 NOTE — Assessment & Plan Note (Signed)
-  Referral for mammogram -At next visit, referral for colonoscopy if mammogram completed

## 2014-08-17 NOTE — Assessment & Plan Note (Signed)
-  Sleep study on 10/16/14

## 2014-08-17 NOTE — Patient Instructions (Signed)
General Instructions:   Please bring your medicines with you each time you come to clinic.  Medicines may include prescription medications, over-the-counter medications, herbal remedies, eye drops, vitamins, or other pills.   Calorie Counting for Weight Loss Calories are energy you get from the things you eat and drink. Your body uses this energy to keep you going throughout the day. The number of calories you eat affects your weight. When you eat more calories than your body needs, your body stores the extra calories as fat. When you eat fewer calories than your body needs, your body Jill Phillips fat to get the energy it needs. Calorie counting means keeping track of how many calories you eat and drink each day. If you make sure to eat fewer calories than your body needs, you should lose weight. In order for calorie counting to work, you will need to eat the number of calories that are right for you in a day to lose a healthy amount of weight per week. A healthy amount of weight to lose per week is usually 1-2 lb (0.5-0.9 kg). A dietitian can determine how many calories you need in a day and give you suggestions on how to reach your calorie goal.  WHAT IS MY MY PLAN? My goal is to have __________ calories per day.  If I have this many calories per day, I should lose around __________ pounds per week. WHAT DO I NEED TO KNOW ABOUT CALORIE COUNTING? In order to meet your daily calorie goal, you will need to:  Find out how many calories are in each food you would like to eat. Try to do this before you eat.  Decide how much of the food you can eat.  Write down what you ate and how many calories it had. Doing this is called keeping a food log. WHERE DO I FIND CALORIE INFORMATION? The number of calories in a food can be found on a Nutrition Facts label. Note that all the information on a label is based on a specific serving of the food. If a food does not have a Nutrition Facts label, try to look up the  calories online or ask your dietitian for help. HOW DO I DECIDE HOW MUCH TO EAT? To decide how much of the food you can eat, you will need to consider both the number of calories in one serving and the size of one serving. This information can be found on the Nutrition Facts label. If a food does not have a Nutrition Facts label, look up the information online or ask your dietitian for help. Remember that calories are listed per serving. If you choose to have more than one serving of a food, you will have to multiply the calories per serving by the amount of servings you plan to eat. For example, the label on a package of bread might say that a serving size is 1 slice and that there are 90 calories in a serving. If you eat 1 slice, you will have eaten 90 calories. If you eat 2 slices, you will have eaten 180 calories. HOW DO I KEEP A FOOD LOG? After each meal, record the following information in your food log:  What you ate.  How much of it you ate.  How many calories it had.  Then, add up your calories. Keep your food log near you, such as in a small notebook in your pocket. Another option is to use a mobile app or website. Some programs will  calculate calories for you and show you how many calories you have left each time you add an item to the log. WHAT ARE SOME CALORIE COUNTING TIPS?  Use your calories on foods and drinks that will fill you up and not leave you hungry. Some examples of this include foods like nuts and nut butters, vegetables, lean proteins, and high-fiber foods (more than 5 g fiber per serving).  Eat nutritious foods and avoid empty calories. Empty calories are calories you get from foods or beverages that do not have many nutrients, such as candy and soda. It is better to have a nutritious high-calorie food (such as an avocado) than a food with few nutrients (such as a bag of chips).  Know how many calories are in the foods you eat most often. This way, you do not have to  look up how many calories they have each time you eat them.  Look out for foods that may seem like low-calorie foods but are really high-calorie foods, such as baked goods, soda, and fat-free candy.  Pay attention to calories in drinks. Drinks such as sodas, specialty coffee drinks, alcohol, and juices have a lot of calories yet do not fill you up. Choose low-calorie drinks like water and diet drinks.  Focus your calorie counting efforts on higher calorie items. Logging the calories in a garden salad that contains only vegetables is less important than calculating the calories in a milk shake.  Find a way of tracking calories that works for you. Get creative. Most people who are successful find ways to keep track of how much they eat in a day, even if they do not count every calorie. WHAT ARE SOME PORTION CONTROL TIPS?  Know how many calories are in a serving. This will help you know how many servings of a certain food you can have.  Use a measuring cup to measure serving sizes. This is helpful when you start out. With time, you will be able to estimate serving sizes for some foods.  Take some time to put servings of different foods on your favorite plates, bowls, and cups so you know what a serving looks like.  Try not to eat straight from a bag or box. Doing this can lead to overeating. Put the amount you would like to eat in a cup or on a plate to make sure you are eating the right portion.  Use smaller plates, glasses, and bowls to prevent overeating. This is a quick and easy way to practice portion control. If your plate is smaller, less food can fit on it.  Try not to multitask while eating, such as watching TV or using your computer. If it is time to eat, sit down at a table and enjoy your food. Doing this will help you to start recognizing when you are full. It will also make you more aware of what and how much you are eating. HOW CAN I CALORIE COUNT WHEN EATING OUT?  Ask for smaller  portion sizes or child-sized portions.  Consider sharing an entree and sides instead of getting your own entree.  If you get your own entree, eat only half. Ask for a box at the beginning of your meal and put the rest of your entree in it so you are not tempted to eat it.  Look for the calories on the menu. If calories are listed, choose the lower calorie options.  Choose dishes that include vegetables, fruits, whole grains, low-fat dairy products, and  lean protein. Focusing on smart food choices from each of the 5 food groups can help you stay on track at restaurants.  Choose items that are boiled, broiled, grilled, or steamed.  Choose water, milk, unsweetened iced tea, or other drinks without added sugars. If you want an alcoholic beverage, choose a lower calorie option. For example, a regular margarita can have up to 700 calories and a glass of wine has around 150.  Stay away from items that are buttered, battered, fried, or served with cream sauce. Items labeled "crispy" are usually fried, unless stated otherwise.  Ask for dressings, sauces, and syrups on the side. These are usually very high in calories, so do not eat much of them.  Watch out for salads. Many people think salads are a healthy option, but this is often not the case. Many salads come with bacon, fried chicken, lots of cheese, fried chips, and dressing. All of these items have a lot of calories. If you want a salad, choose a garden salad and ask for grilled meats or steak. Ask for the dressing on the side, or ask for olive oil and vinegar or lemon to use as dressing.  Estimate how many servings of a food you are given. For example, a serving of cooked rice is  cup or about the size of half a tennis ball or one cupcake wrapper. Knowing serving sizes will help you be aware of how much food you are eating at restaurants. The list below tells you how big or small some common portion sizes are based on everyday objects.  1 oz--4  stacked dice.  3 oz--1 deck of cards.  1 tsp--1 dice.  1 Tbsp-- a Ping-Pong ball.  2 Tbsp--1 Ping-Pong ball.   cup--1 tennis ball or 1 cupcake wrapper.  1 cup--1 baseball. Document Released: 08/19/2005 Document Revised: 01/03/2014 Document Reviewed: 06/24/2013 [facility] Patient Information 2015 [place], [place]. This information is not intended to replace advice given to you by your health care provider. Make sure you discuss any questions you have with your health care provider.

## 2014-08-17 NOTE — Assessment & Plan Note (Signed)
  Assessment: Progress toward smoking cessation:    Improved Barriers to progress toward smoking cessation:    Stress and pain Comments: Cut back from 2 ppd to 1 ppd  Plan: Instruction/counseling given:  I counseled patient on the dangers of tobacco use, advised patient to stop smoking, and reviewed strategies to maximize success. Medications to assist with smoking cessation:  None Patient agreed to the following self-care plans for smoking cessation: cut down the number of cigarettes smoked  Other plans: Continue discussion at each follow up visit

## 2014-08-17 NOTE — Assessment & Plan Note (Signed)
Patient prefers to use vegetable laxatives and stool softener over Senokot-S.  -Continue home regimen

## 2014-08-17 NOTE — Addendum Note (Signed)
Addended by: Vickii Chafe on: 08/17/2014 05:16 PM   Modules accepted: Orders, Medications

## 2014-08-17 NOTE — Assessment & Plan Note (Signed)
-  Psychiatrist recently started her on Seroquel at night

## 2014-08-17 NOTE — Progress Notes (Signed)
Subjective:    Patient ID: Jill Phillips, female    DOB: 1955/12/08, 58 y.o.   MRN: [id number]  HPI 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 Please see problem oriented assessment and plan for more information.  Review of Systems General: Admits to fatigue. Denies fever, chills, change in appetite  Respiratory: Denies SOB, cough, DOE, chest tightness, and wheezing.   Cardiovascular: Denies chest pain and palpitations.  Gastrointestinal: Admits to constipation (chronic). Denies nausea, vomiting, diarrhea, blood in stool and abdominal distention.  Genitourinary: Denies dysuria, urgency, frequency Endocrine: Denies hot or cold intolerance, polyuria, and polydipsia. Musculoskeletal: Admits to chronic back pain with radiation to front. Denies myalgias, joint swelling, arthralgias  Skin: Denies pallor, rash and wounds.  Neurological: Denies dizziness, headaches, weakness, lightheadedness Psychiatric/Behavioral: Denies mood changes, confusion, nervousness  Past Medical History  Diagnosis Date  . Postlaminectomy syndrome, thoracic region   . Osteoarthrosis, unspecified whether generalized or localized, lower leg   . Dysthymic disorder   . Calcifying tendinitis of shoulder   . Pain in joint, upper arm   . Chronic pain syndrome   . Lumbago   . Primary localized osteoarthrosis, lower leg   . Hypertension   . Hyperlipidemia   . GERD (gastroesophageal reflux disease)   . Thyroid disease   . Restless leg syndrome   . PONV (postoperative nausea and vomiting)   . Heart murmur   . Sleep apnea     s/p surgery- last sleep study 2011- doesnt use oxygen or machine at night as instructed  . History of blood transfusion 1980  . Hypothyroidism   . Active smoker    Outpatient Encounter Prescriptions as of 08/17/2014  Medication Sig Note   . acetaminophen (TYLENOL) 500 MG tablet Take 1,000 mg by mouth every 6 (six) hours as needed for moderate pain. For pain   . albuterol (PROVENTIL HFA;VENTOLIN HFA) 108 (90 BASE) MCG/ACT inhaler Inhale 1 puff into the lungs every 6 (six) hours as needed for wheezing or shortness of breath.   . ALPRAZolam (XANAX) 1 MG tablet Take 1 tablet by mouth daily as needed for anxiety.    [name] amLODipine (NORVASC) 10 MG tablet TAKE 1 TABLET (10 MG TOTAL) BY MOUTH DAILY.   Elmarie Mainland 20 MG TABS  06/24/2014: Received from: External Pharmacy  . benazepril (LOTENSIN) 40 MG tablet Take 1 tablet (40 mg total) by mouth daily.   . carisoprodol (SOMA) 350 MG tablet TAKE 1 TABLET BY MOUTH EVERY 8 HOURS AS NEEDED.   . clonazePAM (KLONOPIN) 2 MG tablet Take 1 tablet (2 mg total) by mouth 2 (two) times daily.   . cloNIDine (CATAPRES) 0.1 MG tablet  08/10/2014: Received from: External Pharmacy  . diclofenac sodium (VOLTAREN) 1 % GEL Apply 2 g topically 3 (three) times daily. 10/25/2013: Apply to arm  . ESZOPICLONE 3 MG tablet Take 3 mg by mouth at bedtime.    . fentaNYL (DURAGESIC) 50 MCG/HR Place 1 patch (50 mcg total) onto the skin every 3 (three) days.   . fexofenadine-pseudoephedrine (ALLEGRA-D) 60-120 MG per tablet Take 1 tablet by mouth 2 (two) times daily.   . fluticasone (FLONASE) 50 MCG/ACT nasal spray Place 2 sprays into both nostrils daily.   [name] gabapentin (NEURONTIN) 300 MG capsule TAKE 1 CAPSULE BY MOUTH TWICE A DAY.   [name] gemfibrozil (LOPID) 600 MG tablet  06/24/2014: Received from: External Pharmacy  . hydrochlorothiazide (HYDRODIURIL) 12.5 MG tablet Take 1 tablet by mouth daily. 03/11/2014: Received from: External Pharmacy Received Sig:   . hydrochlorothiazide (HYDRODIURIL) 12.5 MG tablet TAKE 1 TABLET (12.5 MG TOTAL) BY MOUTH AT BEDTIME.   [name] HYDROcodone-acetaminophen (NORCO) 10-325 MG per tablet Take 1 tablet by mouth every 6 (six) hours as needed for severe pain.   [name] levothyroxine (SYNTHROID, LEVOTHROID) 50 MCG  tablet TAKE 1 TABLET (50 MCG TOTAL) BY MOUTH DAILY BEFORE BREAKFAST.   . naproxen (NAPROSYN) 500 MG tablet Take 750 mg by mouth at bedtime as needed for moderate pain.   [name] omeprazole (PRILOSEC) 20 MG capsule TAKE 1 CAPSULE BY MOUTH 2 TIMES DAILY BEFORE A MEAL.   . pravastatin (PRAVACHOL) 40 MG tablet Take 1 tablet (40 mg total) by mouth daily.   . predniSONE (STERAPRED UNI-PAK) 5 MG TABS tablet Take 1 tablet by mouth daily. 03/11/2014: Received from: External Pharmacy Received Sig:   . promethazine (PHENERGAN) 25 MG tablet Take 1 tablet by mouth as needed. 03/11/2014: Received from: External Pharmacy Received Sig:   . QUEtiapine (SEROQUEL) 50 MG tablet Take 50 mg by mouth. 08/10/2014: Received from: External Pharmacy Received Sig:   . ROZEREM 8 MG tablet  08/10/2014: Received from: External Pharmacy  . senna-docusate (SENOKOT S) 8.6-50 MG per tablet Take 1 tablet by mouth at bedtime as needed for mild constipation.   . valACYclovir (VALTREX) 500 MG tablet Take 1 tablet (500 mg total) by mouth daily.   [name] venlafaxine XR (EFFEXOR-XR) 150 MG 24 hr capsule Take 150 mg by mouth daily. 05/06/2014: Received from: External Pharmacy Received Sig:       Objective:   Physical Exam Filed Vitals:   08/17/14 1521  BP: 143/78  Pulse: 85  Temp: 98.3 F (36.8 C)  TempSrc: Oral  Height: 5\' 3"  (1.6 m)  Weight: 156 lb 4.8 oz (70.897 kg)  SpO2: 97%   General: Vital signs reviewed.  Patient is well-developed and well-nourished, in no acute distress and cooperative with exam.  Cardiovascular: RRR, S1 normal, S2 normal, no murmurs, gallops, or rubs. Pulmonary/Chest: Clear to auscultation bilaterally, no wheezes, rales, or rhonchi. Abdominal: Soft, non-tender, non-distended, BS + Musculoskeletal: Lumbar spine status post many surgeries, mildly tender on palpation. Extremities: No lower extremity edema bilaterally Skin: No rashes or erythema. Psychiatric: Normal mood and affect. speech and behavior is normal.  Cognition and memory are normal.     Assessment & Plan:   Please see problem based assessment and plan.

## 2014-08-17 NOTE — Telephone Encounter (Signed)
Spoke with Dr. Naaman Plummer and he is okay with [place] performing procedure, communicated this to [facility] verbally....jmn

## 2014-08-17 NOTE — Assessment & Plan Note (Signed)
-  Improved with Kegel exercises

## 2014-08-17 NOTE — Assessment & Plan Note (Signed)
Normal TSH in October 2015.  -Continue Synthroid 50 mcg daily

## 2014-08-17 NOTE — Progress Notes (Signed)
Urine drug screen for this encounter is consistent 

## 2014-08-18 LAB — URINALYSIS, ROUTINE W REFLEX MICROSCOPIC
Bilirubin Urine: NEGATIVE
Glucose, UA: NEGATIVE mg/dL
Hgb urine dipstick: NEGATIVE
Ketones, ur: NEGATIVE mg/dL
Leukocytes, UA: NEGATIVE
Nitrite: NEGATIVE
Protein, ur: NEGATIVE mg/dL
Specific Gravity, Urine: 1.021 (ref 1.005–1.030)
Urobilinogen, UA: 1 mg/dL (ref 0.0–1.0)
pH: 5.5 (ref 5.0–8.0)

## 2014-08-18 NOTE — Progress Notes (Signed)
Internal Medicine Clinic Attending  I saw and evaluated the patient.  I personally confirmed the key portions of the history and exam documented by Dr. Richardson and I reviewed pertinent patient test results.  The assessment, diagnosis, and plan were formulated together and I agree with the documentation in the resident's note. 

## 2014-08-22 ENCOUNTER — Other Ambulatory Visit: Payer: Self-pay | Admitting: Physical Medicine & Rehabilitation

## 2014-08-22 NOTE — Telephone Encounter (Signed)
Pt called needing Rx refill for Soma 350 mg tabs, phoned in Rx to [facility], called patient and informed her Rx will be available for p/u shortly

## 2014-08-27 ENCOUNTER — Other Ambulatory Visit: Payer: Self-pay | Admitting: Internal Medicine

## 2014-08-29 ENCOUNTER — Ambulatory Visit: Payer: PRIVATE HEALTH INSURANCE

## 2014-08-29 ENCOUNTER — Ambulatory Visit (HOSPITAL_BASED_OUTPATIENT_CLINIC_OR_DEPARTMENT_OTHER): Payer: PRIVATE HEALTH INSURANCE | Attending: Pulmonary Disease | Admitting: Radiology

## 2014-08-29 VITALS — Ht 64.0 in | Wt 150.0 lb

## 2014-08-29 DIAGNOSIS — G4731 Primary central sleep apnea: Secondary | ICD-10-CM

## 2014-08-29 DIAGNOSIS — G2581 Restless legs syndrome: Secondary | ICD-10-CM | POA: Insufficient documentation

## 2014-08-29 DIAGNOSIS — G4737 Central sleep apnea in conditions classified elsewhere: Secondary | ICD-10-CM | POA: Insufficient documentation

## 2014-08-29 DIAGNOSIS — G4733 Obstructive sleep apnea (adult) (pediatric): Secondary | ICD-10-CM | POA: Insufficient documentation

## 2014-09-07 ENCOUNTER — Ambulatory Visit: Payer: Medicare Other | Admitting: Neurology

## 2014-09-07 ENCOUNTER — Telehealth: Payer: Self-pay | Admitting: Pulmonary Disease

## 2014-09-07 DIAGNOSIS — G4734 Idiopathic sleep related nonobstructive alveolar hypoventilation: Secondary | ICD-10-CM

## 2014-09-08 NOTE — Telephone Encounter (Signed)
Spoke with pt and advised that sleep study results are waiting to be interpreted and we will call her with results as soon as they are ready.  Pt states that she has no energy and sleeps all the time and would like to get results as soon as possible.  Will forward to Dr Halford Chessman for results.

## 2014-09-12 ENCOUNTER — Ambulatory Visit (HOSPITAL_BASED_OUTPATIENT_CLINIC_OR_DEPARTMENT_OTHER): Payer: Medicare Other | Admitting: Pulmonary Disease

## 2014-09-12 DIAGNOSIS — G4736 Sleep related hypoventilation in conditions classified elsewhere: Secondary | ICD-10-CM

## 2014-09-12 NOTE — Telephone Encounter (Signed)
PSG 08/29/14 >> AHI 1.1, SaO2 low 82%, spent 33.7 min with SaO2 < 88%, used 1 liter oxygen  Will have my nurse inform pt that sleep study did not show sleep apnea.  She does not need to use BiPAP.    She did have low oxygen level at night while asleep.  She needs to use 1 liter oxygen while asleep.  Please arrange for her to have 1 liter oxygen while asleep >> check with insurance to determine if she needs ONO on room air at home to qualify for nocturnal oxygen or if results from sleep study are sufficient.  Please schedule for next available ROV with me to further discuss results.

## 2014-09-12 NOTE — Sleep Study (Signed)
[place]  NAME: Jill Phillips DATE OF BIRTH:  Feb 23, 1956 MEDICAL RECORD NUMBER [id number]  LOCATION: [facility]  PHYSICIAN: Chesley Mires, M.D. DATE OF STUDY: 08/29/2014  SLEEP STUDY TYPE: Polysomnogram               REFERRING PHYSICIAN: Chesley Mires, MD  INDICATION FOR STUDY:  Jill Phillips is a 59 y.o. female with sleep disruption.  She has hx of scoliosis, chronic pain, and restless legs.  She has history of sleep apnea, and had UPPP with tonsillectomy.  She was also tried on BiPAP and supplemental oxygen, but had difficulty tolerating set up.  She returns to the sleep lab to further assess for obstructive sleep apnea and hypoventilation.  EPWORTH SLEEPINESS SCORE: 15. HEIGHT: 5\' 4"  (162.6 cm)  WEIGHT: 150 lb (68.04 kg)    Body mass index is 25.73 kg/(m^2).  NECK SIZE: 14 in.  MEDICATIONS:  Current Outpatient Prescriptions on File Prior to Visit  Medication Sig Dispense Refill  . acetaminophen (TYLENOL) 500 MG tablet Take 1,000 mg by mouth every 6 (six) hours as needed for moderate pain. For pain    . albuterol (PROVENTIL HFA;VENTOLIN HFA) 108 (90 BASE) MCG/ACT inhaler Inhale 1 puff into the lungs every 6 (six) hours as needed for wheezing or shortness of breath.    . ALPRAZolam (XANAX) 1 MG tablet Take 1 tablet by mouth daily as needed for anxiety.     [name] amLODipine (NORVASC) 10 MG tablet TAKE 1 TABLET (10 MG TOTAL) BY MOUTH DAILY. 30 tablet 11  . benazepril (LOTENSIN) 40 MG tablet TAKE 1 TABLET BY MOUTH DAILY. 30 tablet 11  . carisoprodol (SOMA) 350 MG tablet TAKE 1 TABLET BY MOUTH EVERY 8 HOURS AS NEEDED. 90 tablet 3  . clonazePAM (KLONOPIN) 2 MG tablet Take 1 tablet (2 mg total) by mouth 2 (two) times daily. 30 tablet 2  . diclofenac sodium (VOLTAREN) 1 % GEL Apply 2 g topically 3 (three) times daily.    . fentaNYL (DURAGESIC) 50 MCG/HR Place 1 patch (50 mcg total) onto the skin every 3 (three) days. 10 patch 0  . fluticasone  (FLONASE) 50 MCG/ACT nasal spray Place 2 sprays into both nostrils daily. (Patient not taking: Reported on 08/17/2014) 16 g 2  . gabapentin (NEURONTIN) 300 MG capsule TAKE 1 CAPSULE BY MOUTH TWICE A DAY. 60 capsule 5  . hydrochlorothiazide (HYDRODIURIL) 12.5 MG tablet Take 1 tablet by mouth daily.    [name] HYDROcodone-acetaminophen (NORCO) 10-325 MG per tablet Take 1 tablet by mouth every 6 (six) hours as needed for severe pain. 80 tablet 0  . levothyroxine (SYNTHROID, LEVOTHROID) 50 MCG tablet TAKE 1 TABLET (50 MCG TOTAL) BY MOUTH DAILY BEFORE BREAKFAST. 30 tablet 6  . naproxen (NAPROSYN) 500 MG tablet Take 750 mg by mouth at bedtime as needed for moderate pain.    [name] omeprazole (PRILOSEC) 20 MG capsule TAKE 1 CAPSULE BY MOUTH 2 TIMES DAILY BEFORE A MEAL. 60 capsule 11  . pravastatin (PRAVACHOL) 40 MG tablet Take 1 tablet (40 mg total) by mouth daily. 30 tablet 6  . promethazine (PHENERGAN) 25 MG tablet Take 1 tablet by mouth as needed.    [name] QUEtiapine (SEROQUEL) 50 MG tablet Take 50 mg by mouth.    . valACYclovir (VALTREX) 500 MG tablet Take 1 tablet (500 mg total) by mouth daily. 30 tablet 3  . venlafaxine XR (EFFEXOR-XR) 150 MG 24 hr capsule Take 150 mg by mouth daily.  No current facility-administered medications on file prior to visit.    SLEEP ARCHITECTURE:  Total recording time: 382 minutes.  Total sleep time was: 266.5 minutes.  Sleep efficiency: 69.8%.  Sleep latency: 27.5 minutes.  REM latency: 280.5 minutes.  Stage N1: 8.4%.  Stage N2: 86.7%.  Stage N3: 0%.  Stage R:  4.9%.  Supine sleep: 161 minutes.  Non-supine sleep: 105.5 minutes.  CARDIAC DATA:  Average heart rate: 67 beats per minute. Rhythm strip: sinus rhythm.  RESPIRATORY DATA: Average respiratory rate: 14. Snoring: mild. Average AHI: 1.1.   Apnea index: 0.2.  Hypopnea index: 0.9. Obstructive apnea index: 0.  Central apnea index: 0.2.  Mixed apnea index: 0. REM AHI: 4.6.  NREM AHI: 0.9. Supine AHI: 0.8. Non-supine  AHI: 0.8.  MOVEMENT/PARASOMNIA:  Periodic limb movement: 0.  Period limb movements with arousals: 0. Restroom trips: 1.  OXYGEN DATA:  Baseline oxygenation: 89%. Lowest SaO2: 82%. Time spent below SaO2 88%: 33.7 minutes. Supplemental oxygen used: 1 liter.  IMPRESSION/ RECOMMENDATION:   While she had a few obstructive respiratory events, these were not sufficient enough to qualify for diagnosis of obstructive sleep apnea.  She had significant oxygen desaturation in the absence of other respiratory events.  This is consistent with sleep related hypoxia/hypoventilation.  She had an SaO2 < 88% for 33.7 minutes of test time prior to being placed on 1 liter supplemental oxygen.  She had good control of her oxygenation with 1 liter supplemental oxygen.   Chesley Mires, M.D. Diplomate, [employer] adviser of Sleep Medicine  ELECTRONICALLY SIGNED ON:  09/12/2014, 5:31 PM [place] PH: [phone]   FX: [phone] [place]

## 2014-09-13 NOTE — Telephone Encounter (Signed)
Results have been explained to patient, pt expressed understanding. Order to be placed for Oxygen 1 Liter flow qhs--pt requests any DME but [facility] and [facility].  Dr Halford Chessman, please advise what dx code you would like to be used for O2 start. Thanks.

## 2014-09-14 ENCOUNTER — Encounter: Payer: Medicare Other | Admitting: Internal Medicine

## 2014-09-14 ENCOUNTER — Ambulatory Visit: Payer: 59 | Admitting: Pulmonary Disease

## 2014-09-14 DIAGNOSIS — G4734 Idiopathic sleep related nonobstructive alveolar hypoventilation: Secondary | ICD-10-CM | POA: Insufficient documentation

## 2014-09-14 DIAGNOSIS — R0902 Hypoxemia: Secondary | ICD-10-CM | POA: Insufficient documentation

## 2014-09-14 NOTE — Telephone Encounter (Signed)
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

## 2014-09-14 NOTE — Telephone Encounter (Signed)
Order sent to [facility]

## 2014-09-14 NOTE — Telephone Encounter (Signed)
Order sent.  will send to [facility] to be aware of pt choice in DME

## 2014-09-16 DIAGNOSIS — G4734 Idiopathic sleep related nonobstructive alveolar hypoventilation: Secondary | ICD-10-CM | POA: Diagnosis not present

## 2014-09-19 ENCOUNTER — Telehealth: Payer: Self-pay | Admitting: Pulmonary Disease

## 2014-09-19 NOTE — Telephone Encounter (Signed)
Called, spoke with pt.  OV scheduled with [facility] on Wednesday, Jan 20 at 1:45 pm. Pt confirmed appt date and time and is to call office back if this appt needs to be rescheduled.  She verbalized understanding and voiced no further questions or concerns at this time.

## 2014-09-19 NOTE — Telephone Encounter (Signed)
She needs ROV to discuss in more detail.

## 2014-09-19 NOTE — Telephone Encounter (Signed)
Spoke with pt, states that she has increased fatigue since wearing 02 qhs.  Pt received 02 on 09/15/14.  States she was told that the more she wears her 02 the better she would feel.  Pt wears 2lpm continuous qhs.  Has no other breathing complaints at this time.   Dr. Halford Chessman please advise.  Thank you!

## 2014-09-21 ENCOUNTER — Ambulatory Visit: Payer: Self-pay | Admitting: Pulmonary Disease

## 2014-09-26 ENCOUNTER — Telehealth: Payer: Self-pay

## 2014-09-26 ENCOUNTER — Encounter: Payer: Medicare Other | Admitting: Physical Medicine & Rehabilitation

## 2014-09-26 NOTE — Telephone Encounter (Signed)
Jill Phillips [phone]  Rodena Piety left message she could not make appointment today due to the weather, so I returned call to reschedule and no one answered so I left message for her to call back and rescheduled at her earliest convince.

## 2014-09-30 ENCOUNTER — Other Ambulatory Visit: Payer: Self-pay | Admitting: Physical Medicine & Rehabilitation

## 2014-09-30 ENCOUNTER — Encounter: Payer: Self-pay | Admitting: Registered Nurse

## 2014-09-30 ENCOUNTER — Encounter: Payer: Medicare Other | Attending: Physical Medicine and Rehabilitation | Admitting: Registered Nurse

## 2014-09-30 VITALS — BP 118/71 | HR 96 | Resp 14

## 2014-09-30 DIAGNOSIS — G4730 Sleep apnea, unspecified: Secondary | ICD-10-CM | POA: Insufficient documentation

## 2014-09-30 DIAGNOSIS — M961 Postlaminectomy syndrome, not elsewhere classified: Secondary | ICD-10-CM | POA: Diagnosis not present

## 2014-09-30 DIAGNOSIS — G2581 Restless legs syndrome: Secondary | ICD-10-CM | POA: Diagnosis not present

## 2014-09-30 DIAGNOSIS — Z760 Encounter for issue of repeat prescription: Secondary | ICD-10-CM | POA: Diagnosis not present

## 2014-09-30 DIAGNOSIS — Z79899 Other long term (current) drug therapy: Secondary | ICD-10-CM

## 2014-09-30 DIAGNOSIS — Z5181 Encounter for therapeutic drug level monitoring: Secondary | ICD-10-CM

## 2014-09-30 DIAGNOSIS — Z720 Tobacco use: Secondary | ICD-10-CM

## 2014-09-30 DIAGNOSIS — G894 Chronic pain syndrome: Secondary | ICD-10-CM | POA: Diagnosis not present

## 2014-09-30 MED ORDER — FENTANYL 50 MCG/HR TD PT72: 50.0000 ug | MEDICATED_PATCH | TRANSDERMAL | Status: DC

## 2014-09-30 MED ORDER — HYDROCODONE-ACETAMINOPHEN 10-325 MG PO TABS: 1.0000 | ORAL_TABLET | Freq: Four times a day (QID) | ORAL | Status: DC | PRN

## 2014-09-30 NOTE — Telephone Encounter (Signed)
Called in electronic refill request for klonipin 2mg  #60  #RF 2

## 2014-09-30 NOTE — Progress Notes (Signed)
Subjective:    Patient ID: Jill Phillips, female    DOB: 19-Jul-1956, 59 y.o.   MRN: [id number]  HPI: [name] is a 59 year old female who returns for follow up for chronic pain and medication refill. She says her pain is located in her mid-lower back.She will be starting a chair exercise regime soon she states. She has been encouraged to increase her activity level .   Pain Inventory Average Pain 4 Pain Right Now 6 My pain is dull and aching  In the last 24 hours, has pain interfered with the following? General activity 5 Relation with others 5 Enjoyment of life 4 What TIME of day is your pain at its worst? daytime, evening Sleep (in general) Good  Pain is worse with: no selection Pain improves with: medication Relief from Meds: 7  Mobility walk without assistance Do you have any goals in this area?  yes  Function disabled: date disabled . I need assistance with the following:  household duties Do you have any goals in this area?  no  Neuro/Psych bowel control problems  Prior Studies Any changes since last visit?  yes sleep study  Physicians involved in your care Any changes since last visit?  no   Family History  Problem Relation Age of Onset  . Kidney disease Mother   . Heart disease Father   . Anuerysm Brother 29    brain  . Heart disease Brother   . Heart disease Sister 80    s/p CABG  . Hypertension Sister    History   Social History  . Marital Status: Divorced    Spouse Name: n/a    Number of Children: 2  . Years of Education: 12+   Occupational History  . disability     back surgeries   Social History Main Topics  . Smoking status: Current Every Day Smoker -- 1.50 packs/day for 45 years    Types: Cigarettes  . Smokeless tobacco: Never Used     Comment:  NOT READY TO QUIT  . Alcohol Use: No  . Drug Use: No  . Sexual Activity: None   Other Topics Concern  . None   Social History Narrative   Lives alone.  One daughter is  local, but is getting ready to move to [place], where her children live with their father.  The other daughter lives near [place], [place].   Past Surgical History  Procedure Laterality Date  . Appendectomy    . Abdominal hysterectomy    . Tubal ligation    . Spine surgery      thoracic x 1,  lumbar x 15  . Right hip replacement    . Knee surgeries r knee      arthroscopy- right  . Hammer toe surgery    . Sleep apnea surgery    . Cholecystectomy    . Joint replacement    . Total knee arthroplasty  05/29/2012    Procedure: TOTAL KNEE ARTHROPLASTY;  Surgeon: Mcarthur Rossetti, MD;  Location: [facility];  Service: Orthopedics;  Laterality: Right;  Right Total Knee Arthroplasty  . Back surgery      16 back surgeries  . Lumbar laminectomy/decompression microdiscectomy N/A 01/28/2014    Procedure: Minimally Invasive Right  L1-2 Microdiscectomy;  Surgeon: [name], MD;  Location: [facility];  Service: Orthopedics;  Laterality: N/A;   Past Medical History  Diagnosis Date  . Postlaminectomy syndrome, thoracic region   . Osteoarthrosis,  unspecified whether generalized or localized, lower leg   . Dysthymic disorder   . Calcifying tendinitis of shoulder   . Pain in joint, upper arm   . Chronic pain syndrome   . Lumbago   . Primary localized osteoarthrosis, lower leg   . Hypertension   . Hyperlipidemia   . GERD (gastroesophageal reflux disease)   . Thyroid disease   . Restless leg syndrome   . PONV (postoperative nausea and vomiting)   . Heart murmur   . Sleep apnea     s/p surgery- last sleep study 2011- doesnt use oxygen or machine at night as instructed  . History of blood transfusion 1980  . Hypothyroidism   . Active smoker    BP 118/71 mmHg  Pulse 96  Resp 14  SpO2 94%  Opioid Risk Score:   Fall Risk Score:    Review of Systems  Constitutional:       Night sweats   All other systems reviewed and are negative.      Objective:   Physical Exam  Constitutional: She is  oriented to person, place, and time. She appears well-developed and well-nourished.  HENT:  Head: Normocephalic and atraumatic.  Neck: Normal range of motion. Neck supple.  Cardiovascular: Normal rate and regular rhythm.   Pulmonary/Chest: Effort normal and breath sounds normal.  Musculoskeletal:  Normal Muscle Bulk and Muscle Testing Reveals: Upper Extremities: Full ROM and Muscle Strength 5/5 Lumbar Paraspinal Tenderness: L-3- L-5 Lower Extremities: Full ROM and Muscle Strength 5/5 Arises from chair with ease Narrow Based Gait   Neurological: She is alert and oriented to person, place, and time.  Skin: Skin is warm and dry.  Psychiatric: She has a normal mood and affect.  Nursing note and vitals reviewed.         Assessment & Plan:  1. Chronic lumbar spine pain/post-lami syndrome: Refilled: Fentanyl 50 MCG one patch every three days #10 and Hydrocodone 10/325 mg one tablet every 6 hours as needed for sever pain #70 2. Osteoarthritis of the knees bilaterally:No complaints today. Continue with Heat, exercise and voltaren gel.  3. Rotator cuff syndrome/subacromial bursitis: No complaints voiced today. 4. Restless legs syndrome: Continue to monitor  20 minutes of face to face patient care time was spent during this visit. All questions were encouraged and answered.   F/u in 1 month

## 2014-10-03 NOTE — Telephone Encounter (Signed)
Rx was done 05/28/14.

## 2014-10-03 NOTE — Telephone Encounter (Signed)
Rx done 05/30/14. Hilda Blades Smaran Gaus RN 10/03/14 1:15PM

## 2014-10-05 ENCOUNTER — Encounter: Payer: Medicaid Other | Admitting: Internal Medicine

## 2014-10-06 ENCOUNTER — Ambulatory Visit: Payer: Medicare Other | Admitting: Registered Nurse

## 2014-10-15 ENCOUNTER — Emergency Department (HOSPITAL_COMMUNITY): Payer: Medicare Other

## 2014-10-15 ENCOUNTER — Encounter (HOSPITAL_COMMUNITY): Payer: Self-pay | Admitting: Emergency Medicine

## 2014-10-15 ENCOUNTER — Emergency Department (HOSPITAL_COMMUNITY)
Admission: EM | Admit: 2014-10-15 | Discharge: 2014-10-15 | Disposition: A | Discharge status: Home / Self Care | Payer: Medicare Other | Attending: Emergency Medicine | Admitting: Emergency Medicine

## 2014-10-15 DIAGNOSIS — E785 Hyperlipidemia, unspecified: Secondary | ICD-10-CM | POA: Insufficient documentation

## 2014-10-15 DIAGNOSIS — Y998 Other external cause status: Secondary | ICD-10-CM | POA: Insufficient documentation

## 2014-10-15 DIAGNOSIS — G2581 Restless legs syndrome: Secondary | ICD-10-CM | POA: Diagnosis not present

## 2014-10-15 DIAGNOSIS — Y92003 Bedroom of unspecified non-institutional (private) residence as the place of occurrence of the external cause: Secondary | ICD-10-CM | POA: Insufficient documentation

## 2014-10-15 DIAGNOSIS — R011 Cardiac murmur, unspecified: Secondary | ICD-10-CM | POA: Insufficient documentation

## 2014-10-15 DIAGNOSIS — W01198A Fall on same level from slipping, tripping and stumbling with subsequent striking against other object, initial encounter: Secondary | ICD-10-CM | POA: Insufficient documentation

## 2014-10-15 DIAGNOSIS — Z880 Allergy status to penicillin: Secondary | ICD-10-CM | POA: Insufficient documentation

## 2014-10-15 DIAGNOSIS — M1990 Unspecified osteoarthritis, unspecified site: Secondary | ICD-10-CM | POA: Insufficient documentation

## 2014-10-15 DIAGNOSIS — S199XXA Unspecified injury of neck, initial encounter: Secondary | ICD-10-CM | POA: Diagnosis not present

## 2014-10-15 DIAGNOSIS — Y9389 Activity, other specified: Secondary | ICD-10-CM | POA: Insufficient documentation

## 2014-10-15 DIAGNOSIS — G894 Chronic pain syndrome: Secondary | ICD-10-CM | POA: Insufficient documentation

## 2014-10-15 DIAGNOSIS — Z79899 Other long term (current) drug therapy: Secondary | ICD-10-CM | POA: Diagnosis not present

## 2014-10-15 DIAGNOSIS — Z720 Tobacco use: Secondary | ICD-10-CM | POA: Insufficient documentation

## 2014-10-15 DIAGNOSIS — K219 Gastro-esophageal reflux disease without esophagitis: Secondary | ICD-10-CM | POA: Insufficient documentation

## 2014-10-15 DIAGNOSIS — Z7951 Long term (current) use of inhaled steroids: Secondary | ICD-10-CM | POA: Insufficient documentation

## 2014-10-15 DIAGNOSIS — R51 Headache: Secondary | ICD-10-CM | POA: Diagnosis not present

## 2014-10-15 DIAGNOSIS — E039 Hypothyroidism, unspecified: Secondary | ICD-10-CM | POA: Diagnosis not present

## 2014-10-15 DIAGNOSIS — I10 Essential (primary) hypertension: Secondary | ICD-10-CM | POA: Diagnosis not present

## 2014-10-15 DIAGNOSIS — S0990XA Unspecified injury of head, initial encounter: Secondary | ICD-10-CM | POA: Diagnosis not present

## 2014-10-15 DIAGNOSIS — M542 Cervicalgia: Secondary | ICD-10-CM | POA: Diagnosis not present

## 2014-10-15 MED ORDER — ACETAMINOPHEN 325 MG PO TABS
650.0000 mg | ORAL_TABLET | Freq: Once | ORAL | Status: AC
  Administered 2014-10-15: 650 mg via ORAL
  Filled 2014-10-15: qty 2

## 2014-10-15 NOTE — ED Provider Notes (Signed)
CSN: [id number]     Arrival date & time 10/15/14  1320 History   First MD Initiated Contact with Patient 10/15/14 1439     Chief Complaint  Patient presents with  . Fall   HPI Pt was getting up out of bed.  She had been up for about 3-4 minutes when she lost her balance and fell.  She hit her head on the floor.  No loc.  She is now feeling dizzy and has a headache.  She also landed on her right breast but it is not sore now.  No pain in her arms or legs.  No chest pain or shortness of breath.  The last time she fell was a few weeks ago. Past Medical History  Diagnosis Date  . Postlaminectomy syndrome, thoracic region   . Osteoarthrosis, unspecified whether generalized or localized, lower leg   . Dysthymic disorder   . Calcifying tendinitis of shoulder   . Pain in joint, upper arm   . Chronic pain syndrome   . Lumbago   . Primary localized osteoarthrosis, lower leg   . Hypertension   . Hyperlipidemia   . GERD (gastroesophageal reflux disease)   . Thyroid disease   . Restless leg syndrome   . PONV (postoperative nausea and vomiting)   . Heart murmur   . Sleep apnea     s/p surgery- last sleep study 2011- doesnt use oxygen or machine at night as instructed  . History of blood transfusion 1980  . Hypothyroidism   . Active smoker    Past Surgical History  Procedure Laterality Date  . Appendectomy    . Abdominal hysterectomy    . Tubal ligation    . Spine surgery      thoracic x 1,  lumbar x 15  . Right hip replacement    . Knee surgeries r knee      arthroscopy- right  . Hammer toe surgery    . Sleep apnea surgery    . Cholecystectomy    . Joint replacement    . Total knee arthroplasty  05/29/2012    Procedure: TOTAL KNEE ARTHROPLASTY;  Surgeon: Mcarthur Rossetti, MD;  Location: [facility];  Service: Orthopedics;  Laterality: Right;  Right Total Knee Arthroplasty  . Back surgery      16 back surgeries  . Lumbar laminectomy/decompression microdiscectomy N/A 01/28/2014   Procedure: Minimally Invasive Right  L1-2 Microdiscectomy;  Surgeon: [name], MD;  Location: [facility];  Service: Orthopedics;  Laterality: N/A;   Family History  Problem Relation Age of Onset  . Kidney disease Mother   . Heart disease Father   . Anuerysm Brother 29    brain  . Heart disease Brother   . Heart disease Sister 54    s/p CABG  . Hypertension Sister    History  Substance Use Topics  . Smoking status: Current Every Day Smoker -- 1.50 packs/day for 45 years    Types: Cigarettes  . Smokeless tobacco: Never Used     Comment:  NOT READY TO QUIT  . Alcohol Use: No   OB History    No data available     Review of Systems  All other systems reviewed and are negative.     Allergies  Amoxicillin; Chlorzoxazone; Codeine; Darvocet; Dilaudid; Flagyl; Keflex; Morphine and related; Nitrofurantoin monohyd macro; Percocet; and Sulfa antibiotics  Home Medications   Prior to Admission medications   Medication Sig Start Date End Date Taking? Authorizing Provider  acetaminophen (TYLENOL) 500 MG tablet Take 1,000 mg by mouth every 6 (six) hours as needed for moderate pain. For pain   Yes Historical Provider, MD  albuterol (PROVENTIL HFA;VENTOLIN HFA) 108 (90 BASE) MCG/ACT inhaler Inhale 1 puff into the lungs every 6 (six) hours as needed for wheezing or shortness of breath.   Yes Historical Provider, MD  ALPRAZolam Duanne Moron) 1 MG tablet Take 1 tablet by mouth daily as needed for anxiety.  05/27/13  Yes Historical Provider, MD  amLODipine (NORVASC) 10 MG tablet TAKE 1 TABLET (10 MG TOTAL) BY MOUTH DAILY. 08/11/14  Yes Alexa Marvel Plan, MD  benazepril (LOTENSIN) 40 MG tablet TAKE 1 TABLET BY MOUTH DAILY. 08/29/14  Yes Alexa Marvel Plan, MD  carisoprodol (SOMA) 350 MG tablet TAKE 1 TABLET BY MOUTH EVERY 8 HOURS AS NEEDED. Patient taking differently: TAKE 1 TABLET BY MOUTH EVERY 8 HOURS AS NEEDED for pain 08/22/14  Yes Meredith Staggers, MD  clonazePAM (KLONOPIN) 2 MG tablet TAKE 2 TABLETS  BY MOUTH AT BEDTIME. 09/30/14  Yes Danella Sensing, NP  diclofenac sodium (VOLTAREN) 1 % GEL Apply 2 g topically 3 (three) times daily as needed (pain).    Yes Historical Provider, MD  fentaNYL (DURAGESIC) 50 MCG/HR Place 1 patch (50 mcg total) onto the skin every 3 (three) days. 09/30/14  Yes Danella Sensing, NP  fluticasone (FLONASE) 50 MCG/ACT nasal spray Place 2 sprays into both nostrils daily. Patient taking differently: Place 2 sprays into both nostrils daily as needed for allergies.  05/05/14 05/05/15 Yes Alexa Marvel Plan, MD  hydrochlorothiazide (HYDRODIURIL) 12.5 MG tablet Take 1 tablet by mouth daily. 03/09/14  Yes Historical Provider, MD  HYDROcodone-acetaminophen (NORCO) 10-325 MG per tablet Take 1 tablet by mouth every 6 (six) hours as needed for severe pain. 09/30/14  Yes Danella Sensing, NP  levothyroxine (SYNTHROID, LEVOTHROID) 50 MCG tablet TAKE 1 TABLET (50 MCG TOTAL) BY MOUTH DAILY BEFORE BREAKFAST. 08/01/14  Yes Alexa Marvel Plan, MD  naproxen (NAPROSYN) 500 MG tablet Take 750 mg by mouth at bedtime as needed for moderate pain.   Yes Historical Provider, MD  omeprazole (PRILOSEC) 20 MG capsule TAKE 1 CAPSULE BY MOUTH 2 TIMES DAILY BEFORE A MEAL. 08/02/14  Yes Alexa Marvel Plan, MD  pravastatin (PRAVACHOL) 40 MG tablet Take 1 tablet (40 mg total) by mouth daily. 06/08/14  Yes Alexa Marvel Plan, MD  promethazine (PHENERGAN) 25 MG tablet Take 1 tablet by mouth as needed. 02/18/14  Yes Historical Provider, MD  QUEtiapine (SEROQUEL) 50 MG tablet Take 50 mg by mouth. 08/05/14  Yes Historical Provider, MD  valACYclovir (VALTREX) 500 MG tablet Take 1 tablet (500 mg total) by mouth daily. 05/30/14  Yes Alexa Marvel Plan, MD  venlafaxine XR (EFFEXOR-XR) 150 MG 24 hr capsule Take 150 mg by mouth daily. 05/02/14  Yes Historical Provider, MD  gabapentin (NEURONTIN) 300 MG capsule TAKE 1 CAPSULE BY MOUTH TWICE A DAY. Patient not taking: Reported on 09/30/2014    Meredith Staggers, MD   BP 108/72 mmHg  Pulse 95   Temp(Src) 98 F (36.7 C) (Oral)  Resp 12  SpO2 95% Physical Exam  Constitutional: She appears well-developed and well-nourished. No distress.  HENT:  Head: Normocephalic and atraumatic.  Right Ear: External ear normal.  Left Ear: External ear normal.  Eyes: Conjunctivae are normal. Right eye exhibits no discharge. Left eye exhibits no discharge. No scleral icterus.  Neck: Neck supple. No tracheal deviation present.  Cardiovascular: Normal rate, regular rhythm and intact distal pulses.   Pulmonary/Chest: Effort normal  and breath sounds normal. No stridor. No respiratory distress. She has no wheezes. She has no rales.  Abdominal: Soft. Bowel sounds are normal. She exhibits no distension. There is no tenderness. There is no rebound and no guarding.  Musculoskeletal: She exhibits no edema.       Cervical back: She exhibits tenderness and bony tenderness. She exhibits no swelling.       Thoracic back: She exhibits no tenderness and no bony tenderness.       Lumbar back: She exhibits no tenderness and no bony tenderness.  Neurological: She is alert. She has normal strength. No cranial nerve deficit (no facial droop, extraocular movements intact, no slurred speech) or sensory deficit. She exhibits normal muscle tone. She displays no seizure activity. Coordination normal.  Skin: Skin is warm and dry. No rash noted.  Psychiatric: She has a normal mood and affect.  Nursing note and vitals reviewed.   ED Course  Procedures (including critical care time) Labs Review Labs Reviewed - No data to display  Imaging Review Ct Head Wo Contrast  10/15/2014   CLINICAL DATA:  59 year old female with acute small with head and neck injury. Headache and neck pain. Initial encounter.  EXAM: CT HEAD WITHOUT CONTRAST  CT CERVICAL SPINE WITHOUT CONTRAST  TECHNIQUE: Multidetector CT imaging of the head and cervical spine was performed following the standard protocol without intravenous contrast. Multiplanar CT  image reconstructions of the cervical spine were also generated.  COMPARISON:  01/06/2006 cervical spine CT and 12/22/2009 brain MR.  FINDINGS: CT HEAD FINDINGS  Mild chronic small-vessel white matter ischemic changes are again identified.  No acute intracranial abnormalities are identified, including mass lesion or mass effect, hydrocephalus, extra-axial fluid collection, midline shift, hemorrhage, or acute infarction. The visualized bony calvarium is unremarkable.  CT CERVICAL SPINE FINDINGS  Normal alignment is noted.  There is no evidence of acute fracture, subluxation or prevertebral soft tissue swelling.  Mild multilevel degenerative disc disease and facet arthropathy throughout the cervical spine identified.  No focal bony lesions are noted.  The soft tissue structures are unremarkable.  IMPRESSION: No evidence of acute intracranial abnormality.  No static evidence of acute injury to the cervical spine.  Mild chronic small-vessel white matter ischemic changes and mild degenerative changes within the cervical spine.   Electronically Signed   By: Margarette Canada M.D.   On: 10/15/2014 15:38   Ct Cervical Spine Wo Contrast  10/15/2014   CLINICAL DATA:  59 year old female with acute small with head and neck injury. Headache and neck pain. Initial encounter.  EXAM: CT HEAD WITHOUT CONTRAST  CT CERVICAL SPINE WITHOUT CONTRAST  TECHNIQUE: Multidetector CT imaging of the head and cervical spine was performed following the standard protocol without intravenous contrast. Multiplanar CT image reconstructions of the cervical spine were also generated.  COMPARISON:  01/06/2006 cervical spine CT and 12/22/2009 brain MR.  FINDINGS: CT HEAD FINDINGS  Mild chronic small-vessel white matter ischemic changes are again identified.  No acute intracranial abnormalities are identified, including mass lesion or mass effect, hydrocephalus, extra-axial fluid collection, midline shift, hemorrhage, or acute infarction. The visualized bony  calvarium is unremarkable.  CT CERVICAL SPINE FINDINGS  Normal alignment is noted.  There is no evidence of acute fracture, subluxation or prevertebral soft tissue swelling.  Mild multilevel degenerative disc disease and facet arthropathy throughout the cervical spine identified.  No focal bony lesions are noted.  The soft tissue structures are unremarkable.  IMPRESSION: No evidence of acute intracranial abnormality.  No static evidence of acute injury to the cervical spine.  Mild chronic small-vessel white matter ischemic changes and mild degenerative changes within the cervical spine.   Electronically Signed   By: Margarette Canada M.D.   On: 10/15/2014 15:38    Medications  acetaminophen (TYLENOL) tablet 650 mg (650 mg Oral Given 10/15/14 1502)     MDM   Final diagnoses:  Minor head injury, initial encounter   Suspect pt's balance issues may be related to her medications for her chronic pain issues.  No sign of acute injury.  Discussed findings with patient and daughter.  Stable for discharge.     Dorie Rank, MD 10/15/14 [phone]

## 2014-10-15 NOTE — ED Notes (Signed)
Pt reports falling this am and hitting forehead on floor. Pt had just got up and started walking around her apartment when this happened. Denies LOC, not on blood thinners. Pt reports multiple falls this week. Pt also states she fell on R breast and is having pain there. Denies any other pain locations.

## 2014-10-15 NOTE — ED Notes (Signed)
Case management at bedside.

## 2014-10-15 NOTE — ED Notes (Signed)
Per Agricultural consultant, pt and family are waiting for resources from case management before pt can be d/c

## 2014-10-15 NOTE — Discharge Instructions (Signed)
Concussion  A concussion, or closed-head injury, is a brain injury caused by a direct blow to the head or by a quick and sudden movement (jolt) of the head or neck. Concussions are usually not life-threatening. Even so, the effects of a concussion can be serious. If you have had a concussion before, you are more likely to experience concussion-like symptoms after a direct blow to the head.   CAUSES  · Direct blow to the head, such as from running into another player during a soccer game, being hit in a fight, or hitting your head on a hard surface.  · A jolt of the head or neck that causes the brain to move back and forth inside the skull, such as in a car crash.  SIGNS AND SYMPTOMS  The signs of a concussion can be hard to notice. Early on, they may be missed by you, family members, and health care providers. You may look fine but act or feel differently.  Symptoms are usually temporary, but they may last for days, weeks, or even longer. Some symptoms may appear right away while others may not show up for hours or days. Every head injury is different. Symptoms include:  · Mild to moderate headaches that will not go away.  · A feeling of pressure inside your head.  · Having more trouble than usual:  ¨ Learning or remembering things you have heard.  ¨ Answering questions.  ¨ Paying attention or concentrating.  ¨ Organizing daily tasks.  ¨ Making decisions and solving problems.  · Slowness in thinking, acting or reacting, speaking, or reading.  · Getting lost or being easily confused.  · Feeling tired all the time or lacking energy (fatigued).  · Feeling drowsy.  · Sleep disturbances.  ¨ Sleeping more than usual.  ¨ Sleeping less than usual.  ¨ Trouble falling asleep.  ¨ Trouble sleeping (insomnia).  · Loss of balance or feeling lightheaded or dizzy.  · Nausea or vomiting.  · Numbness or tingling.  · Increased sensitivity to:  ¨ Sounds.  ¨ Lights.  ¨ Distractions.  · Vision problems or eyes that tire  easily.  · Diminished sense of taste or smell.  · Ringing in the ears.  · Mood changes such as feeling sad or anxious.  · Becoming easily irritated or angry for little or no reason.  · Lack of motivation.  · Seeing or hearing things other people do not see or hear (hallucinations).  DIAGNOSIS  Your health care provider can usually diagnose a concussion based on a description of your injury and symptoms. He or she will ask whether you passed out (lost consciousness) and whether you are having trouble remembering events that happened right before and during your injury.  Your evaluation might include:  · A brain scan to look for signs of injury to the brain. Even if the test shows no injury, you may still have a concussion.  · Blood tests to be sure other problems are not present.  TREATMENT  · Concussions are usually treated in an emergency department, in urgent care, or at a clinic. You may need to stay in the hospital overnight for further treatment.  · Tell your health care provider if you are taking any medicines, including prescription medicines, over-the-counter medicines, and natural remedies. Some medicines, such as blood thinners (anticoagulants) and aspirin, may increase the chance of complications. Also tell your health care provider whether you have had alcohol or are taking illegal drugs. This information   may affect treatment.  · Your health care provider will send you home with important instructions to follow.  · How fast you will recover from a concussion depends on many factors. These factors include how severe your concussion is, what part of your brain was injured, your age, and how healthy you were before the concussion.  · Most people with mild injuries recover fully. Recovery can take time. In general, recovery is slower in older persons. Also, persons who have had a concussion in the past or have other medical problems may find that it takes longer to recover from their current injury.  HOME  CARE INSTRUCTIONS  General Instructions  · Carefully follow the directions your health care provider gave you.  · Only take over-the-counter or prescription medicines for pain, discomfort, or fever as directed by your health care provider.  · Take only those medicines that your health care provider has approved.  · Do not drink alcohol until your health care provider says you are well enough to do so. Alcohol and certain other drugs may slow your recovery and can put you at risk of further injury.  · If it is harder than usual to remember things, write them down.  · If you are easily distracted, try to do one thing at a time. For example, do not try to watch TV while fixing dinner.  · Talk with family members or close friends when making important decisions.  · Keep all follow-up appointments. Repeated evaluation of your symptoms is recommended for your recovery.  · Watch your symptoms and tell others to do the same. Complications sometimes occur after a concussion. Older adults with a brain injury may have a higher risk of serious complications, such as a blood clot on the brain.  · Tell your teachers, school nurse, school counselor, coach, athletic trainer, or work manager about your injury, symptoms, and restrictions. Tell them about what you can or cannot do. They should watch for:  ¨ Increased problems with attention or concentration.  ¨ Increased difficulty remembering or learning new information.  ¨ Increased time needed to complete tasks or assignments.  ¨ Increased irritability or decreased ability to cope with stress.  ¨ Increased symptoms.  · Rest. Rest helps the brain to heal. Make sure you:  ¨ Get plenty of sleep at night. Avoid staying up late at night.  ¨ Keep the same bedtime hours on weekends and weekdays.  ¨ Rest during the day. Take daytime naps or rest breaks when you feel tired.  · Limit activities that require a lot of thought or concentration. These include:  ¨ Doing homework or job-related  work.  ¨ Watching TV.  ¨ Working on the computer.  · Avoid any situation where there is potential for another head injury (football, hockey, soccer, basketball, martial arts, downhill snow sports and horseback riding). Your condition will get worse every time you experience a concussion. You should avoid these activities until you are evaluated by the appropriate follow-up health care providers.  Returning To Your Regular Activities  You will need to return to your normal activities slowly, not all at once. You must give your body and brain enough time for recovery.  · Do not return to sports or other athletic activities until your health care provider tells you it is safe to do so.  · Ask your health care provider when you can drive, ride a bicycle, or operate heavy machinery. Your ability to react may be slower after a   brain injury. Never do these activities if you are dizzy.  · Ask your health care provider about when you can return to work or school.  Preventing Another Concussion  It is very important to avoid another brain injury, especially before you have recovered. In rare cases, another injury can lead to permanent brain damage, brain swelling, or death. The risk of this is greatest during the first 7-10 days after a head injury. Avoid injuries by:  · Wearing a seat belt when riding in a car.  · Drinking alcohol only in moderation.  · Wearing a helmet when biking, skiing, skateboarding, skating, or doing similar activities.  · Avoiding activities that could lead to a second concussion, such as contact or recreational sports, until your health care provider says it is okay.  · Taking safety measures in your home.  ¨ Remove clutter and tripping hazards from floors and stairways.  ¨ Use grab bars in bathrooms and handrails by stairs.  ¨ Place non-slip mats on floors and in bathtubs.  ¨ Improve lighting in dim areas.  SEEK MEDICAL CARE IF:  · You have increased problems paying attention or  concentrating.  · You have increased difficulty remembering or learning new information.  · You need more time to complete tasks or assignments than before.  · You have increased irritability or decreased ability to cope with stress.  · You have more symptoms than before.  Seek medical care if you have any of the following symptoms for more than 2 weeks after your injury:  · Lasting (chronic) headaches.  · Dizziness or balance problems.  · Nausea.  · Vision problems.  · Increased sensitivity to noise or light.  · Depression or mood swings.  · Anxiety or irritability.  · Memory problems.  · Difficulty concentrating or paying attention.  · Sleep problems.  · Feeling tired all the time.  SEEK IMMEDIATE MEDICAL CARE IF:  · You have severe or worsening headaches. These may be a sign of a blood clot in the brain.  · You have weakness (even if only in one hand, leg, or part of the face).  · You have numbness.  · You have decreased coordination.  · You vomit repeatedly.  · You have increased sleepiness.  · One pupil is larger than the other.  · You have convulsions.  · You have slurred speech.  · You have increased confusion. This may be a sign of a blood clot in the brain.  · You have increased restlessness, agitation, or irritability.  · You are unable to recognize people or places.  · You have neck pain.  · It is difficult to wake you up.  · You have unusual behavior changes.  · You lose consciousness.  MAKE SURE YOU:  · Understand these instructions.  · Will watch your condition.  · Will get help right away if you are not doing well or get worse.  Document Released: 11/09/2003 Document Revised: 08/24/2013 Document Reviewed: 03/11/2013  [employer]® Patient Information ©2015 [employer]. This information is not intended to replace advice given to you by your health care provider. Make sure you discuss any questions you have with your health care provider.

## 2014-10-16 ENCOUNTER — Encounter (HOSPITAL_BASED_OUTPATIENT_CLINIC_OR_DEPARTMENT_OTHER): Payer: Medicare Other

## 2014-10-17 DIAGNOSIS — G4734 Idiopathic sleep related nonobstructive alveolar hypoventilation: Secondary | ICD-10-CM | POA: Diagnosis not present

## 2014-10-21 ENCOUNTER — Other Ambulatory Visit: Payer: Self-pay | Admitting: *Deleted

## 2014-10-21 MED ORDER — DICLOFENAC SODIUM 1 % TD GEL: 2.0000 g | Freq: Three times a day (TID) | TRANSDERMAL | Status: DC | PRN

## 2014-10-24 ENCOUNTER — Encounter: Payer: Self-pay | Admitting: Pulmonary Disease

## 2014-10-24 ENCOUNTER — Ambulatory Visit (INDEPENDENT_AMBULATORY_CARE_PROVIDER_SITE_OTHER): Payer: Medicare Other | Admitting: Pulmonary Disease

## 2014-10-24 VITALS — BP 112/76 | HR 85 | Temp 98.5°F | Ht 64.0 in | Wt 155.2 lb

## 2014-10-24 DIAGNOSIS — R942 Abnormal results of pulmonary function studies: Secondary | ICD-10-CM

## 2014-10-24 DIAGNOSIS — G4734 Idiopathic sleep related nonobstructive alveolar hypoventilation: Secondary | ICD-10-CM | POA: Diagnosis not present

## 2014-10-24 DIAGNOSIS — Z720 Tobacco use: Secondary | ICD-10-CM

## 2014-10-24 DIAGNOSIS — R0600 Dyspnea, unspecified: Secondary | ICD-10-CM

## 2014-10-24 MED ORDER — DOXYCYCLINE HYCLATE 100 MG PO TABS: 100.0000 mg | ORAL_TABLET | Freq: Two times a day (BID) | ORAL | Status: DC

## 2014-10-24 NOTE — Patient Instructions (Signed)
Doxycycline 100 mg twice per day for 7 days Will schedule CT chest Follow up in 8 weeks

## 2014-10-24 NOTE — Progress Notes (Signed)
Chief Complaint  Patient presents with  . Follow-up    Review sleep study. Pt c/o head cold, fever, cough with PND and laryngitis.     History of Present Illness: Jill Phillips is a 59 y.o. female for evaluation of sleep problems.  She has been getting more cough with sputum and fever >> not checking temp.  She has sinus congestion and sore throat.  She has intermittent wheeze.  She has been using albuterol intermittently >> helps some.  She continues to smoke.  She used her daughters adderall >> helped with alertness.  She has been feeling sleepy.  She is using oxygen at night >> not helping her energy during the day.  Tests: PSG 05/21/13 >> AHI 15.7, SaO2 low 82% PFT 05/26/14 >> FEV1 2.28 (88%), FEV1% 86, TLC 4.05 (80%), DLCO 65% PSG 08/29/14 >> AHI 1.1, SaO2 low 82%, spent 33.7 min with SaO2 < 88%, used 1 liter oxygen  PMHx > HTN, HLD, Depression, GERD, Hypothyroidism  PSHx, Medications, Allergies, Fhx, Shx reviewed.  Physical Exam: Blood pressure 112/76, pulse 85, temperature 98.5 F (36.9 C), temperature source Oral, height 5\' 4"  (1.626 m), weight 155 lb 3.2 oz (70.398 kg), SpO2 95 %. Body mass index is 26.63 kg/(m^2).  General - No distress ENT - No sinus tenderness, no oral exudate, no LAN, no thyromegaly, TM clear, pupils equal/reactive Cardiac - s1s2 regular, no murmur, pulses symmetric Chest - No wheeze/rales/dullness, good air entry, normal respiratory excursion Back - No focal tenderness Abd - Soft, non-tender, no organomegaly, + bowel sounds Ext - No edema Neuro - Normal strength, cranial nerves intact Skin - No rashes Psych - Normal mood, and behavior  Assessment/plan:  Sleep related hypoxia. Plan: - continue 1 liter oxygen at night  Hx of OSA s/p UPPP with persistent hypersomnia. Recent sleep study negative for sleep apnea. Plan: - advised her to d/w psychiatry about whether she could benefit from stimulant medication  Hx of smoking with diffusion defect  on PFT and continued dyspnea. Plan: - will check CT chest with contrast - smoking cessation  Acute bronchitis. Plan: - doxycycline for 7 days   Chesley Mires, M.D. Pager [phone]

## 2014-10-26 ENCOUNTER — Ambulatory Visit: Payer: Medicare Other | Admitting: Registered Nurse

## 2014-10-26 ENCOUNTER — Encounter: Payer: Medicare Other | Admitting: Physical Medicine & Rehabilitation

## 2014-10-27 ENCOUNTER — Encounter: Payer: Self-pay | Admitting: Registered Nurse

## 2014-10-27 ENCOUNTER — Encounter: Payer: Medicare Other | Attending: Physical Medicine and Rehabilitation | Admitting: Registered Nurse

## 2014-10-27 ENCOUNTER — Other Ambulatory Visit: Payer: Self-pay | Admitting: Internal Medicine

## 2014-10-27 VITALS — BP 117/76 | HR 87 | Resp 14

## 2014-10-27 DIAGNOSIS — M961 Postlaminectomy syndrome, not elsewhere classified: Secondary | ICD-10-CM | POA: Diagnosis not present

## 2014-10-27 DIAGNOSIS — Z79899 Other long term (current) drug therapy: Secondary | ICD-10-CM

## 2014-10-27 DIAGNOSIS — Z760 Encounter for issue of repeat prescription: Secondary | ICD-10-CM | POA: Diagnosis not present

## 2014-10-27 DIAGNOSIS — G2581 Restless legs syndrome: Secondary | ICD-10-CM | POA: Insufficient documentation

## 2014-10-27 DIAGNOSIS — Z5181 Encounter for therapeutic drug level monitoring: Secondary | ICD-10-CM

## 2014-10-27 DIAGNOSIS — Z720 Tobacco use: Secondary | ICD-10-CM | POA: Diagnosis not present

## 2014-10-27 DIAGNOSIS — G4730 Sleep apnea, unspecified: Secondary | ICD-10-CM | POA: Diagnosis not present

## 2014-10-27 DIAGNOSIS — G894 Chronic pain syndrome: Secondary | ICD-10-CM

## 2014-10-27 MED ORDER — HYDROCODONE-ACETAMINOPHEN 10-325 MG PO TABS: 1.0000 | ORAL_TABLET | Freq: Four times a day (QID) | ORAL | Status: DC | PRN

## 2014-10-27 MED ORDER — FENTANYL 50 MCG/HR TD PT72: 50.0000 ug | MEDICATED_PATCH | TRANSDERMAL | Status: DC

## 2014-10-27 NOTE — Progress Notes (Signed)
Subjective:    Patient ID: Jill Phillips, female    DOB: 09-12-1955, 59 y.o.   MRN: [id number]  HPI:Jill Phillips is a 59 year old female who returns for follow up for chronic pain and medication refill. She says her pain is located in her lower back and lower extremities.She rates her pain 4. She's  not following an exercise regime stating she's getting over a virus.   Pain Inventory Average Pain 4 Pain Right Now 4 My pain is aching  In the last 24 hours, has pain interfered with the following? General activity 0 Relation with others 0 Enjoyment of life 0 What TIME of day is your pain at its worst? daytime, night Sleep (in general) Fair  Pain is worse with: walking, sitting and standing Pain improves with: rest and medication Relief from Meds: 8  Mobility walk without assistance Do you have any goals in this area?  no  Function Do you have any goals in this area?  no  Neuro/Psych No problems in this area  Prior Studies Any changes since last visit?  no  Physicians involved in your care Any changes since last visit?  no   Family History  Problem Relation Age of Onset  . Kidney disease Mother   . Heart disease Father   . Anuerysm Brother 29    brain  . Heart disease Brother   . Heart disease Sister 40    s/p CABG  . Hypertension Sister    History   Social History  . Marital Status: Divorced    Spouse Name: n/a  . Number of Children: 2  . Years of Education: 12+   Occupational History  . disability     back surgeries   Social History Main Topics  . Smoking status: Current Every Day Smoker -- 1.50 packs/day for 45 years    Types: Cigarettes  . Smokeless tobacco: Never Used     Comment:  NOT READY TO QUIT  . Alcohol Use: No  . Drug Use: No  . Sexual Activity: Not on file   Other Topics Concern  . None   Social History Narrative   Lives alone.  One daughter is local, but is getting ready to move to [place], where her children live with  their father.  The other daughter lives near [place], [place].   Past Surgical History  Procedure Laterality Date  . Appendectomy    . Abdominal hysterectomy    . Tubal ligation    . Spine surgery      thoracic x 1,  lumbar x 15  . Right hip replacement    . Knee surgeries r knee      arthroscopy- right  . Hammer toe surgery    . Sleep apnea surgery    . Cholecystectomy    . Joint replacement    . Total knee arthroplasty  05/29/2012    Procedure: TOTAL KNEE ARTHROPLASTY;  Surgeon: Mcarthur Rossetti, MD;  Location: [facility];  Service: Orthopedics;  Laterality: Right;  Right Total Knee Arthroplasty  . Back surgery      16 back surgeries  . Lumbar laminectomy/decompression microdiscectomy N/A 01/28/2014    Procedure: Minimally Invasive Right  L1-2 Microdiscectomy;  Surgeon: [name], MD;  Location: [facility];  Service: Orthopedics;  Laterality: N/A;   Past Medical History  Diagnosis Date  . Postlaminectomy syndrome, thoracic region   . Osteoarthrosis, unspecified whether generalized or localized, lower leg   . Dysthymic  disorder   . Calcifying tendinitis of shoulder   . Pain in joint, upper arm   . Chronic pain syndrome   . Lumbago   . Primary localized osteoarthrosis, lower leg   . Hypertension   . Hyperlipidemia   . GERD (gastroesophageal reflux disease)   . Thyroid disease   . Restless leg syndrome   . PONV (postoperative nausea and vomiting)   . Heart murmur   . Sleep apnea     s/p surgery- last sleep study 2011- doesnt use oxygen or machine at night as instructed  . History of blood transfusion 1980  . Hypothyroidism   . Active smoker    BP 117/76 mmHg  Pulse 87  Resp 14  SpO2 98%  Opioid Risk Score:   Fall Risk Score: Low Fall Risk (0-5 points) (patient previously educated)  Review of Systems  Respiratory: Positive for cough.        Respiratory infection  All other systems reviewed and are negative.      Objective:   Physical Exam  Constitutional: She is  oriented to person, place, and time. She appears well-developed and well-nourished.  HENT:  Head: Normocephalic and atraumatic.  Neck: Normal range of motion. Neck supple.  Cardiovascular: Normal rate and regular rhythm.   Pulmonary/Chest: Effort normal and breath sounds normal.  Musculoskeletal:  Normal Muscle Bulk and Muscle Testing Reveals: Upper Extremities: Full ROM and Muscle Strength 5/5 Lumbar Paraspinal Tenderness: L-3- L-5 Lower Extremities: Full ROM and Muscle Strength 5/5 Arises from chair with ease Narrow Based gait  Neurological: She is alert and oriented to person, place, and time.  Skin: Skin is warm and dry.  Psychiatric: She has a normal mood and affect.  Nursing note and vitals reviewed.         Assessment & Plan:  1. Chronic lumbar spine pain/post-lami syndrome: Refilled: Fentanyl 50 MCG one patch every three days #10 and Hydrocodone 10/325 mg one tablet every 6 hours as needed for sever pain #70 2. Osteoarthritis of the knees bilaterally:No complaints today. Continue with Heat, exercise and voltaren gel.  3. Rotator cuff syndrome/subacromial bursitis: No complaints voiced today. 4. Restless legs syndrome: Continue to monitor 5. Tobacco Abuse: Encourage Smoking Cessation: Pulmonologist Following per patient.  20 minutes of face to face patient care time was spent during this visit. All questions were encouraged and answered.   F/u in 1 month

## 2014-10-28 ENCOUNTER — Telehealth: Payer: Self-pay | Admitting: *Deleted

## 2014-10-28 NOTE — Telephone Encounter (Signed)
Recd prior authorization approval for the Voltaren Gel ref# [id number] good from 10/27/2014 thru 20/24/2017

## 2014-11-02 ENCOUNTER — Inpatient Hospital Stay: Admission: RE | Admit: 2014-11-02 | Payer: Medicare Other | Source: Ambulatory Visit

## 2014-11-04 ENCOUNTER — Telehealth: Payer: Self-pay | Admitting: *Deleted

## 2014-11-04 NOTE — Telephone Encounter (Signed)
Recd letter from patent's insurance will not cover Carisoprodol.  Recommendation from insurance replace with:  Meloxicam, Sertraline, Diclofenac, Tizanidine or Lyrica. Patient has not tried any of the above listed alternative therapies. Do not think that an appeal will help.

## 2014-11-15 DIAGNOSIS — G4734 Idiopathic sleep related nonobstructive alveolar hypoventilation: Secondary | ICD-10-CM | POA: Diagnosis not present

## 2014-11-23 ENCOUNTER — Ambulatory Visit (INDEPENDENT_AMBULATORY_CARE_PROVIDER_SITE_OTHER)
Admission: RE | Admit: 2014-11-23 | Discharge: 2014-11-23 | Disposition: A | Discharge status: Home / Self Care | Payer: Medicare Other | Source: Ambulatory Visit | Attending: Pulmonary Disease | Admitting: Pulmonary Disease

## 2014-11-23 DIAGNOSIS — J439 Emphysema, unspecified: Secondary | ICD-10-CM | POA: Diagnosis not present

## 2014-11-23 DIAGNOSIS — R0600 Dyspnea, unspecified: Secondary | ICD-10-CM

## 2014-11-23 DIAGNOSIS — R942 Abnormal results of pulmonary function studies: Secondary | ICD-10-CM

## 2014-11-23 DIAGNOSIS — Z720 Tobacco use: Secondary | ICD-10-CM | POA: Diagnosis not present

## 2014-11-23 DIAGNOSIS — F17200 Nicotine dependence, unspecified, uncomplicated: Secondary | ICD-10-CM | POA: Diagnosis not present

## 2014-11-23 MED ORDER — IOHEXOL 300 MG/ML  SOLN
80.0000 mL | Freq: Once | INTRAMUSCULAR | Status: AC | PRN
  Administered 2014-11-23: 80 mL via INTRAVENOUS

## 2014-11-24 ENCOUNTER — Encounter: Payer: Self-pay | Admitting: Registered Nurse

## 2014-11-24 ENCOUNTER — Encounter: Payer: Medicare Other | Attending: Physical Medicine and Rehabilitation | Admitting: Registered Nurse

## 2014-11-24 ENCOUNTER — Telehealth: Payer: Self-pay | Admitting: Pulmonary Disease

## 2014-11-24 VITALS — BP 126/80 | HR 76 | Resp 14

## 2014-11-24 DIAGNOSIS — G894 Chronic pain syndrome: Secondary | ICD-10-CM | POA: Diagnosis not present

## 2014-11-24 DIAGNOSIS — Z760 Encounter for issue of repeat prescription: Secondary | ICD-10-CM | POA: Insufficient documentation

## 2014-11-24 DIAGNOSIS — M961 Postlaminectomy syndrome, not elsewhere classified: Secondary | ICD-10-CM | POA: Diagnosis not present

## 2014-11-24 DIAGNOSIS — G2581 Restless legs syndrome: Secondary | ICD-10-CM | POA: Diagnosis not present

## 2014-11-24 DIAGNOSIS — Z5181 Encounter for therapeutic drug level monitoring: Secondary | ICD-10-CM

## 2014-11-24 DIAGNOSIS — Z79899 Other long term (current) drug therapy: Secondary | ICD-10-CM

## 2014-11-24 DIAGNOSIS — Z720 Tobacco use: Secondary | ICD-10-CM | POA: Diagnosis not present

## 2014-11-24 DIAGNOSIS — G4730 Sleep apnea, unspecified: Secondary | ICD-10-CM | POA: Diagnosis not present

## 2014-11-24 MED ORDER — HYDROCODONE-ACETAMINOPHEN 10-325 MG PO TABS: 1.0000 | ORAL_TABLET | Freq: Four times a day (QID) | ORAL | Status: DC | PRN

## 2014-11-24 MED ORDER — FENTANYL 50 MCG/HR TD PT72: 50.0000 ug | MEDICATED_PATCH | TRANSDERMAL | Status: DC

## 2014-11-24 NOTE — Telephone Encounter (Signed)
Pt would like the results from her CT. Please advise. Thanks.

## 2014-11-24 NOTE — Progress Notes (Signed)
Subjective:    Patient ID: Jill Phillips, female    DOB: Dec 18, 1955, 59 y.o.   MRN: [id number]  HPI: Jill Phillips is a 59 year old female who returns for follow up for chronic pain and medication refill. She says her pain is located in her mid- back. Also states she has been having increase frequency of muscle spasms.She rates her pain 6. Her current exercise regime is walking.  Pain Inventory Average Pain 5 Pain Right Now 6 My pain is constant, sharp, stabbing, tingling and aching  In the last 24 hours, has pain interfered with the following? General activity 7 Relation with others 3 Enjoyment of life 3 What TIME of day is your pain at its worst? morning , daytime and evening Sleep (in general) Good  Pain is worse with: walking, sitting, standing and some activites Pain improves with: rest and medication Relief from Meds: 9  Mobility walk without assistance how many minutes can you walk? 5 ability to climb steps?  yes do you drive?  no  Function disabled: date disabled .  Neuro/Psych No problems in this area  Prior Studies Any changes since last visit?  no  Physicians involved in your care Any changes since last visit?  no   Family History  Problem Relation Age of Onset  . Kidney disease Mother   . Heart disease Father   . Anuerysm Brother 29    brain  . Heart disease Brother   . Heart disease Sister 79    s/p CABG  . Hypertension Sister    History   Social History  . Marital Status: Divorced    Spouse Name: n/a  . Number of Children: 2  . Years of Education: 12+   Occupational History  . disability     back surgeries   Social History Main Topics  . Smoking status: Current Every Day Smoker -- 1.50 packs/day for 45 years    Types: Cigarettes  . Smokeless tobacco: Never Used     Comment:  NOT READY TO QUIT  . Alcohol Use: No  . Drug Use: No  . Sexual Activity: Not on file   Other Topics Concern  . None   Social History Narrative   Lives alone.  One daughter is local, but is getting ready to move to [place], where her children live with their father.  The other daughter lives near [place], [place].   Past Surgical History  Procedure Laterality Date  . Appendectomy    . Abdominal hysterectomy    . Tubal ligation    . Spine surgery      thoracic x 1,  lumbar x 15  . Right hip replacement    . Knee surgeries r knee      arthroscopy- right  . Hammer toe surgery    . Sleep apnea surgery    . Cholecystectomy    . Joint replacement    . Total knee arthroplasty  05/29/2012    Procedure: TOTAL KNEE ARTHROPLASTY;  Surgeon: Mcarthur Rossetti, MD;  Location: [facility];  Service: Orthopedics;  Laterality: Right;  Right Total Knee Arthroplasty  . Back surgery      16 back surgeries  . Lumbar laminectomy/decompression microdiscectomy N/A 01/28/2014    Procedure: Minimally Invasive Right  L1-2 Microdiscectomy;  Surgeon: [name], MD;  Location: [facility];  Service: Orthopedics;  Laterality: N/A;   Past Medical History  Diagnosis Date  . Postlaminectomy syndrome, thoracic region   .  Osteoarthrosis, unspecified whether generalized or localized, lower leg   . Dysthymic disorder   . Calcifying tendinitis of shoulder   . Pain in joint, upper arm   . Chronic pain syndrome   . Lumbago   . Primary localized osteoarthrosis, lower leg   . Hypertension   . Hyperlipidemia   . GERD (gastroesophageal reflux disease)   . Thyroid disease   . Restless leg syndrome   . PONV (postoperative nausea and vomiting)   . Heart murmur   . Sleep apnea     s/p surgery- last sleep study 2011- doesnt use oxygen or machine at night as instructed  . History of blood transfusion 1980  . Hypothyroidism   . Active smoker    BP 126/80 mmHg  Pulse 76  Resp 14  SpO2 96%  Opioid Risk Score:   Fall Risk Score: Low Fall Risk (0-5 points)`1  Depression screen PHQ 2/9  Depression screen [facility] 2/9 11/24/2014 08/17/2014 06/08/2014 05/05/2014 01/03/2014  07/14/2013 05/19/2013  Decreased Interest 0 0 0 0 0 0 0  Down, Depressed, Hopeless 0 1 1 1  0 0 0  PHQ - 2 Score 0 1 1 1  0 0 0  Altered sleeping 3 - - - - - -  Tired, decreased energy 3 - - - - - -  Change in appetite 1 - - - - - -  Feeling bad or failure about yourself  0 - - - - - -  Trouble concentrating 0 - - - - - -  Moving slowly or fidgety/restless 0 - - - - - -  Suicidal thoughts 0 - - - - - -  PHQ-9 Score 7 - - - - - -     Review of Systems  Constitutional: Negative.   HENT: Negative.   Eyes: Negative.   Respiratory: Positive for cough.   Cardiovascular: Negative.   Gastrointestinal: Positive for abdominal pain.  Endocrine: Negative.   Genitourinary: Negative.   Musculoskeletal: Positive for myalgias, back pain and arthralgias.  Skin: Negative.   Allergic/Immunologic: Negative.   Neurological: Negative.   Hematological: Negative.   Psychiatric/Behavioral: Negative.        Objective:   Physical Exam  Constitutional: She is oriented to person, place, and time. She appears well-developed and well-nourished.  HENT:  Head: Normocephalic and atraumatic.  Neck: Normal range of motion. Neck supple.  Cardiovascular: Normal rate and regular rhythm.   Pulmonary/Chest: Effort normal and breath sounds normal.  Musculoskeletal:  Normal Muscle Bulk and Muscle Testing Reveals: Upper Extremities: Full ROM and Muscle Strength 5/5 Back without spinal or paraspinal tenderness Lower Extremities: Full ROM and Muscle strength 5/5 Arises from chair with ease Narrow Based gait   Neurological: She is alert and oriented to person, place, and time.  Skin: Skin is warm and dry.  Psychiatric: She has a normal mood and affect.  Nursing note and vitals reviewed.         Assessment & Plan:  1. Chronic lumbar spine pain/post-lami syndrome: Refilled: Fentanyl 50 MCG one patch every three days #10 and Hydrocodone 10/325 mg one tablet every 6 hours as needed for sever pain #70 2.  Osteoarthritis of the knees bilaterally:No complaints today. Continue with Heat, exercise and voltaren gel.  3. Rotator cuff syndrome/subacromial bursitis: No complaints voiced today. 4. Restless legs syndrome: Continue to monitor 5. Tobacco Abuse: Encourage Smoking Cessation: Pulmonologist Following per patient.  20 minutes of face to face patient care time was spent during this visit. All questions  were encouraged and answered.   F/u in 1 month

## 2014-11-28 NOTE — Telephone Encounter (Signed)
CT chest 11/23/14 >> mild paraseptal emphysema at apices  Will have my nurse inform patient that she has mild changes of emphysema on CT chest.  This correlates with finding on recent breathing test.  Most important thing for her to do is stop smoking.  Please schedule with ROV to discuss results in more detail.

## 2014-11-29 ENCOUNTER — Telehealth: Payer: Self-pay | Admitting: *Deleted

## 2014-11-29 DIAGNOSIS — Z720 Tobacco use: Secondary | ICD-10-CM

## 2014-11-29 MED ORDER — NICOTINE 14 MG/24HR TD PT24: 14.0000 mg | MEDICATED_PATCH | Freq: Every day | TRANSDERMAL | Status: DC

## 2014-11-29 NOTE — Telephone Encounter (Signed)
Spoke with pt and advised of Ct Results per Dr Halford Chessman.  Pt advised to keep f/u appt with Dr Halford Chessman on 12/21/14.

## 2014-11-29 NOTE — Telephone Encounter (Signed)
Recd letter from [facility] stating that the appeal was denied.  Carisoprodol is not covered under formulary.  Can switch her to Tizanidine which is covered at tier 2.

## 2014-11-29 NOTE — Telephone Encounter (Signed)
Call from pt - stated she recently saw Dr Halford Chessman and had a CT Scan; wants to stop smoking. Requesting rx for patches; send to [facility].

## 2014-11-30 ENCOUNTER — Telehealth: Payer: Self-pay | Admitting: Pulmonary Disease

## 2014-11-30 NOTE — Telephone Encounter (Signed)
Jill Mires, MD at 11/28/2014 6:45 AM     Status: Signed       Expand All Collapse All   CT chest 11/23/14 >> mild paraseptal emphysema at apices  Will have my nurse inform patient that she has mild changes of emphysema on CT chest. This correlates with finding on recent breathing test. Most important thing for her to do is stop smoking.  Please schedule with ROV to discuss results in more detail.       Pt has been given her results. All of her questions have been answered. Nothing further was needed.

## 2014-12-09 ENCOUNTER — Other Ambulatory Visit: Payer: Self-pay | Admitting: Physical Medicine & Rehabilitation

## 2014-12-16 DIAGNOSIS — G4734 Idiopathic sleep related nonobstructive alveolar hypoventilation: Secondary | ICD-10-CM | POA: Diagnosis not present

## 2014-12-20 ENCOUNTER — Ambulatory Visit: Payer: Medicare Other | Admitting: Physical Medicine & Rehabilitation

## 2014-12-20 ENCOUNTER — Other Ambulatory Visit: Payer: Self-pay | Admitting: Registered Nurse

## 2014-12-20 ENCOUNTER — Encounter: Payer: Medicare Other | Attending: Physical Medicine and Rehabilitation | Admitting: Registered Nurse

## 2014-12-20 ENCOUNTER — Encounter: Payer: Self-pay | Admitting: Registered Nurse

## 2014-12-20 VITALS — BP 110/65 | HR 78 | Resp 16

## 2014-12-20 DIAGNOSIS — Z760 Encounter for issue of repeat prescription: Secondary | ICD-10-CM | POA: Diagnosis not present

## 2014-12-20 DIAGNOSIS — Z5181 Encounter for therapeutic drug level monitoring: Secondary | ICD-10-CM | POA: Diagnosis not present

## 2014-12-20 DIAGNOSIS — Z720 Tobacco use: Secondary | ICD-10-CM | POA: Diagnosis not present

## 2014-12-20 DIAGNOSIS — G2581 Restless legs syndrome: Secondary | ICD-10-CM | POA: Insufficient documentation

## 2014-12-20 DIAGNOSIS — M961 Postlaminectomy syndrome, not elsewhere classified: Secondary | ICD-10-CM | POA: Diagnosis not present

## 2014-12-20 DIAGNOSIS — G894 Chronic pain syndrome: Secondary | ICD-10-CM

## 2014-12-20 DIAGNOSIS — G4730 Sleep apnea, unspecified: Secondary | ICD-10-CM | POA: Insufficient documentation

## 2014-12-20 DIAGNOSIS — Z79899 Other long term (current) drug therapy: Secondary | ICD-10-CM | POA: Diagnosis not present

## 2014-12-20 MED ORDER — FENTANYL 50 MCG/HR TD PT72: 50.0000 ug | MEDICATED_PATCH | TRANSDERMAL | Status: DC

## 2014-12-20 MED ORDER — HYDROCODONE-ACETAMINOPHEN 10-325 MG PO TABS: 1.0000 | ORAL_TABLET | Freq: Four times a day (QID) | ORAL | Status: DC | PRN

## 2014-12-20 NOTE — Progress Notes (Signed)
Subjective:    Patient ID: Jill Phillips, female    DOB: 1955/11/09, 59 y.o.   MRN: [id number]  HPI: [name] is a 59 year old female who returns for follow up for chronic pain and medication refill. She says her pain is located in her lower back. She rates her pain 6. Her current exercise regime is walking in her home. She walks to her mailbox weekly she states. She states was diagnosed with emphysema her pulmonologist is Dr. Halford Chessman, she's wearing oxygen at 2 liters nasal cannula at bedtime. Encouraged to continue smoking cessation she verbalizes understanding.   Pain Inventory Average Pain 6 Pain Right Now 6 My pain is dull and aching  In the last 24 hours, has pain interfered with the following? General activity 4 Relation with others 4 Enjoyment of life 4 What TIME of day is your pain at its worst? daytime and evening Sleep (in general) Poor  Pain is worse with: walking Pain improves with: medication Relief from Meds: 8  Mobility walk without assistance  Function disabled: date disabled .  Neuro/Psych spasms  Prior Studies Any changes since last visit?  no  Physicians involved in your care pulmonary Dr Halford Chessman   Family History  Problem Relation Age of Onset  . Kidney disease Mother   . Heart disease Father   . Anuerysm Brother 29    brain  . Heart disease Brother   . Heart disease Sister 62    s/p CABG  . Hypertension Sister    History   Social History  . Marital Status: Divorced    Spouse Name: n/a  . Number of Children: 2  . Years of Education: 12+   Occupational History  . disability     back surgeries   Social History Main Topics  . Smoking status: Current Every Day Smoker -- 1.50 packs/day for 45 years    Types: Cigarettes  . Smokeless tobacco: Never Used     Comment: trying to quit  . Alcohol Use: No  . Drug Use: No  . Sexual Activity: Not on file   Other Topics Concern  . None   Social History Narrative   Lives alone.  One  daughter is local, but is getting ready to move to [place], where her children live with their father.  The other daughter lives near [place], [place].   Past Surgical History  Procedure Laterality Date  . Appendectomy    . Abdominal hysterectomy    . Tubal ligation    . Spine surgery      thoracic x 1,  lumbar x 15  . Right hip replacement    . Knee surgeries r knee      arthroscopy- right  . Hammer toe surgery    . Sleep apnea surgery    . Cholecystectomy    . Joint replacement    . Total knee arthroplasty  05/29/2012    Procedure: TOTAL KNEE ARTHROPLASTY;  Surgeon: Mcarthur Rossetti, MD;  Location: [facility];  Service: Orthopedics;  Laterality: Right;  Right Total Knee Arthroplasty  . Back surgery      16 back surgeries  . Lumbar laminectomy/decompression microdiscectomy N/A 01/28/2014    Procedure: Minimally Invasive Right  L1-2 Microdiscectomy;  Surgeon: [name], MD;  Location: [facility];  Service: Orthopedics;  Laterality: N/A;   Past Medical History  Diagnosis Date  . Postlaminectomy syndrome, thoracic region   . Osteoarthrosis, unspecified whether generalized or localized, lower leg   .  Dysthymic disorder   . Calcifying tendinitis of shoulder   . Pain in joint, upper arm   . Chronic pain syndrome   . Lumbago   . Primary localized osteoarthrosis, lower leg   . Hypertension   . Hyperlipidemia   . GERD (gastroesophageal reflux disease)   . Thyroid disease   . Restless leg syndrome   . PONV (postoperative nausea and vomiting)   . Heart murmur   . Sleep apnea     s/p surgery- last sleep study 2011- doesnt use oxygen or machine at night as instructed  . History of blood transfusion 1980  . Hypothyroidism   . Active smoker    BP 110/65 mmHg  Pulse 78  Resp 16  SpO2 91%  Opioid Risk Score:   Fall Risk Score: Low Fall Risk (0-5 points)`1  Depression screen PHQ 2/9  Depression screen [facility] 2/9 11/24/2014 08/17/2014 06/08/2014 05/05/2014 01/03/2014 07/14/2013 05/19/2013    Decreased Interest 0 0 0 0 0 0 0  Down, Depressed, Hopeless 0 1 1 1  0 0 0  PHQ - 2 Score 0 1 1 1  0 0 0  Altered sleeping 3 - - - - - -  Tired, decreased energy 3 - - - - - -  Change in appetite 1 - - - - - -  Feeling bad or failure about yourself  0 - - - - - -  Trouble concentrating 0 - - - - - -  Moving slowly or fidgety/restless 0 - - - - - -  Suicidal thoughts 0 - - - - - -  PHQ-9 Score 7 - - - - - -     Review of Systems  Constitutional: Positive for diaphoresis.  Musculoskeletal:       Spasms  All other systems reviewed and are negative.      Objective:   Physical Exam  Constitutional: She is oriented to person, place, and time. She appears well-developed and well-nourished.  HENT:  Head: Normocephalic and atraumatic.  Neck: Normal range of motion. Neck supple.  Cardiovascular: Normal rate and regular rhythm.   Pulmonary/Chest: Effort normal and breath sounds normal.  Musculoskeletal:  Normal Muscle Bulk and Muscle Testing Reveals: Upper Extremities: Full ROM and Muscle strength 5/5 Lumbar Paraspinal Tenderness: L-4- L-5 Lower Extremities: Full ROM and Muscle Strength 5/5 Arises from chair with ease Narrow Based Gait  Neurological: She is alert and oriented to person, place, and time.  Skin: Skin is warm and dry.  Psychiatric: She has a normal mood and affect.  Nursing note and vitals reviewed.         Assessment & Plan:  1. Chronic lumbar spine pain/post-lami syndrome: Refilled: Fentanyl 50 MCG one patch every three days #10 and Hydrocodone 10/325 mg one tablet every 6 hours as needed for sever pain #70 2. Osteoarthritis of the knees bilaterally:No complaints today. Continue with Heat, exercise and voltaren gel.  3. Rotator cuff syndrome/subacromial bursitis: No complaints voiced today. 4. Restless legs syndrome: Continue to monitor 5. Tobacco Abuse: Encourage Smoking Cessation: Pulmonologist Following 6. Emphysema: Pulmonology Following  20 minutes  of face to face patient care time was spent during this visit. All questions were encouraged and answered.   F/u in 1 month

## 2014-12-21 ENCOUNTER — Encounter: Payer: Self-pay | Admitting: Pulmonary Disease

## 2014-12-21 ENCOUNTER — Ambulatory Visit (INDEPENDENT_AMBULATORY_CARE_PROVIDER_SITE_OTHER): Payer: Medicare Other | Admitting: Pulmonary Disease

## 2014-12-21 VITALS — BP 120/80 | HR 77 | Temp 98.1°F | Ht 64.0 in | Wt 149.0 lb

## 2014-12-21 DIAGNOSIS — Z720 Tobacco use: Secondary | ICD-10-CM

## 2014-12-21 DIAGNOSIS — J438 Other emphysema: Secondary | ICD-10-CM | POA: Diagnosis not present

## 2014-12-21 DIAGNOSIS — G4734 Idiopathic sleep related nonobstructive alveolar hypoventilation: Secondary | ICD-10-CM

## 2014-12-21 LAB — PMP ALCOHOL METABOLITE (ETG): Ethyl Glucuronide (EtG): NEGATIVE ng/mL

## 2014-12-21 MED ORDER — TIOTROPIUM BROMIDE MONOHYDRATE 18 MCG IN CAPS: 18.0000 ug | ORAL_CAPSULE | Freq: Every day | RESPIRATORY_TRACT | Status: DC

## 2014-12-21 MED ORDER — ALBUTEROL SULFATE HFA 108 (90 BASE) MCG/ACT IN AERS: 1.0000 | INHALATION_SPRAY | Freq: Four times a day (QID) | RESPIRATORY_TRACT | Status: DC | PRN

## 2014-12-21 NOTE — Progress Notes (Signed)
Chief Complaint  Patient presents with  . Follow-up    Pt is c/o SOB, trouble swallowing. Denies any chest congestion/tightness, wheezing.     History of Present Illness: Jill Phillips is a 59 y.o. female for evaluation of sleep problems.  Sob with activity, smoke down to 1 pack per day, albuterol helps some.  She has occasional cough.  She still feels fatigued.    Tests: PSG 05/21/13 >> AHI 15.7, SaO2 low 82% PFT 05/26/14 >> FEV1 2.28 (88%), FEV1% 86, TLC 4.05 (80%), DLCO 65% PSG 08/29/14 >> AHI 1.1, SaO2 low 82%, spent 33.7 min with SaO2 < 88%, used 1 liter oxygen CT chest 11/23/14 >> mild paraseptal emphysema at apices  PMHx > HTN, HLD, Depression, GERD, Hypothyroidism  PSHx, Medications, Allergies, Fhx, Shx reviewed.  Physical Exam: Blood pressure 120/80, pulse 77, temperature 98.1 F (36.7 C), temperature source Oral, height 5\' 4"  (1.626 m), weight 149 lb (67.586 kg), SpO2 95 %.  Body mass index is 25.56 kg/(m^2).  General - No distress ENT - No sinus tenderness, no oral exudate, no LAN, no thyromegaly, TM clear, pupils equal/reactive Cardiac - s1s2 regular, no murmur, pulses symmetric Chest - No wheeze/rales/dullness, good air entry, normal respiratory excursion Back - No focal tenderness Abd - Soft, non-tender, no organomegaly, + bowel sounds Ext - No edema Neuro - Normal strength, cranial nerves intact Skin - healing lesion on Rt elbow Psych - Normal mood, and behavior  Assessment/plan:  Sleep related hypoxia. Plan: - continue 1 liter oxygen at night  Hx of OSA s/p UPPP with persistent hypersomnia. Recent sleep study negative for sleep apnea. Plan: - advised her to d/w psychiatry about whether she could benefit from stimulant medication  Emphysema. Plan: - will add spiriva, and continue prn albuterol  Tobacco abuse. - smoking cessation  Dysphagia. Plan: - advised her to d/w her PCP if this persists  Rt elbow erosion. Plan: - advised her to leave  open to air, and keep clean >> appears to be healing    Chesley Mires, M.D. Pager [phone]

## 2014-12-21 NOTE — Patient Instructions (Signed)
Spiriva one puff daily Albuterol 2 puffs up to four times per day as needed for cough, wheeze, or chest congestion Follow up in 2 months

## 2014-12-22 ENCOUNTER — Telehealth: Payer: Self-pay | Admitting: *Deleted

## 2014-12-22 NOTE — Telephone Encounter (Signed)
Prior authorization sent in yesterday for Carisoprodol has been denied.

## 2014-12-23 ENCOUNTER — Other Ambulatory Visit: Payer: Self-pay | Admitting: Physical Medicine & Rehabilitation

## 2014-12-23 LAB — BENZODIAZEPINES (GC/LC/MS), URINE
Alprazolam metabolite (GC/LC/MS), ur confirm: NEGATIVE ng/mL (ref <25)
Clonazepam metabolite (GC/LC/MS), ur confirm: 2103 ng/mL (ref <25)
Flurazepam metabolite (GC/LC/MS), ur confirm: NEGATIVE ng/mL (ref <50)
Lorazepam (GC/LC/MS), ur confirm: NEGATIVE ng/mL (ref <50)
Midazolam (GC/LC/MS), ur confirm: NEGATIVE ng/mL (ref <50)
Nordiazepam (GC/LC/MS), ur confirm: NEGATIVE ng/mL (ref <50)
Oxazepam (GC/LC/MS), ur confirm: NEGATIVE ng/mL (ref <50)
Temazepam (GC/LC/MS), ur confirm: NEGATIVE ng/mL (ref <50)
Triazolam metabolite (GC/LC/MS), ur confirm: NEGATIVE ng/mL (ref <50)

## 2014-12-23 LAB — OPIATES/OPIOIDS (LC/MS-MS)
Codeine Urine: NEGATIVE ng/mL (ref <50)
Hydrocodone: 124 ng/mL — AB (ref <50)
Hydromorphone: 117 ng/mL — AB (ref <50)
Morphine Urine: NEGATIVE ng/mL (ref <50)
Norhydrocodone, Ur: 1148 ng/mL — AB (ref <50)
Noroxycodone, Ur: NEGATIVE ng/mL (ref <50)
Oxycodone, ur: NEGATIVE ng/mL (ref <50)
Oxymorphone: NEGATIVE ng/mL (ref <50)

## 2014-12-23 LAB — ZOLPIDEM (LC/MS-MS), URINE
Zolpidem (GC/LC/MS), Ur confirm: NEGATIVE ng/mL (ref <5)
Zolpidem metabolite (GC/LC/MS) Ur, confirm: 303 ng/mL — AB (ref <5)

## 2014-12-23 LAB — FENTANYL (GC/LC/MS), URINE
Fentanyl, confirm: 110.8 ng/mL (ref <0.5)
Norfentanyl, confirm: 930.8 ng/mL (ref <0.5)

## 2014-12-23 LAB — CARISOPRODOL (GC/LC/MS), URINE: Meprobamate (GC/LC/MS), ur confirm: 17793 ng/mL (ref <1000)

## 2014-12-24 LAB — PRESCRIPTION MONITORING PROFILE (SOLSTAS)
Amphetamine/Meth: NEGATIVE ng/mL
Barbiturate Screen, Urine: NEGATIVE ng/mL
Buprenorphine, Urine: NEGATIVE ng/mL
Cannabinoid Scrn, Ur: NEGATIVE ng/mL
Cocaine Metabolites: NEGATIVE ng/mL
Creatinine, Urine: 182.14 mg/dL (ref >20.0)
MDMA URINE: NEGATIVE ng/mL
Meperidine, Ur: NEGATIVE ng/mL
Methadone Screen, Urine: NEGATIVE ng/mL
Nitrites, Initial: NEGATIVE ug/mL
Oxycodone Screen, Ur: NEGATIVE ng/mL
Propoxyphene: NEGATIVE ng/mL
Tapentadol, urine: NEGATIVE ng/mL
Tramadol Scrn, Ur: NEGATIVE ng/mL
pH, Initial: 5.3 pH (ref 4.5–8.9)

## 2015-01-05 NOTE — Progress Notes (Signed)
Urine drug screen for this encounter is consistent for prescribed medication but also shows presence of metabolite of taking Lorrin Mais which is not a prescribed medication for [place].

## 2015-01-06 ENCOUNTER — Other Ambulatory Visit: Payer: Self-pay | Admitting: Internal Medicine

## 2015-01-15 DIAGNOSIS — G4734 Idiopathic sleep related nonobstructive alveolar hypoventilation: Secondary | ICD-10-CM | POA: Diagnosis not present

## 2015-01-18 ENCOUNTER — Encounter: Payer: Self-pay | Admitting: Physical Medicine & Rehabilitation

## 2015-01-18 ENCOUNTER — Telehealth: Payer: Self-pay | Admitting: Physical Medicine & Rehabilitation

## 2015-01-18 ENCOUNTER — Encounter: Payer: Medicare Other | Attending: Physical Medicine and Rehabilitation | Admitting: Physical Medicine & Rehabilitation

## 2015-01-18 VITALS — BP 122/76 | HR 91 | Resp 14

## 2015-01-18 DIAGNOSIS — M170 Bilateral primary osteoarthritis of knee: Secondary | ICD-10-CM

## 2015-01-18 DIAGNOSIS — R5383 Other fatigue: Secondary | ICD-10-CM | POA: Diagnosis not present

## 2015-01-18 DIAGNOSIS — G4730 Sleep apnea, unspecified: Secondary | ICD-10-CM | POA: Insufficient documentation

## 2015-01-18 DIAGNOSIS — Z760 Encounter for issue of repeat prescription: Secondary | ICD-10-CM | POA: Diagnosis not present

## 2015-01-18 DIAGNOSIS — M5126 Other intervertebral disc displacement, lumbar region: Secondary | ICD-10-CM | POA: Diagnosis not present

## 2015-01-18 DIAGNOSIS — M961 Postlaminectomy syndrome, not elsewhere classified: Secondary | ICD-10-CM

## 2015-01-18 DIAGNOSIS — G2581 Restless legs syndrome: Secondary | ICD-10-CM | POA: Insufficient documentation

## 2015-01-18 DIAGNOSIS — G4734 Idiopathic sleep related nonobstructive alveolar hypoventilation: Secondary | ICD-10-CM | POA: Diagnosis not present

## 2015-01-18 DIAGNOSIS — E038 Other specified hypothyroidism: Secondary | ICD-10-CM | POA: Diagnosis not present

## 2015-01-18 DIAGNOSIS — Z720 Tobacco use: Secondary | ICD-10-CM

## 2015-01-18 LAB — VITAMIN B12: Vitamin B-12: 421 pg/mL (ref 211–911)

## 2015-01-18 LAB — T4, FREE: Free T4: 0.88 ng/dL (ref 0.80–1.80)

## 2015-01-18 LAB — T3, FREE: T3, Free: 2.1 pg/mL — ABNORMAL LOW (ref 2.3–4.2)

## 2015-01-18 LAB — FOLATE: Folate: 10.6 ng/mL

## 2015-01-18 LAB — TSH: TSH: 4.622 u[IU]/mL — ABNORMAL HIGH (ref 0.350–4.500)

## 2015-01-18 MED ORDER — GABAPENTIN 300 MG PO CAPS: 300.0000 mg | ORAL_CAPSULE | Freq: Every day | ORAL | Status: DC

## 2015-01-18 MED ORDER — FENTANYL 50 MCG/HR TD PT72: 50.0000 ug | MEDICATED_PATCH | TRANSDERMAL | Status: DC

## 2015-01-18 MED ORDER — HYDROCODONE-ACETAMINOPHEN 10-325 MG PO TABS: 1.0000 | ORAL_TABLET | Freq: Four times a day (QID) | ORAL | Status: DC | PRN

## 2015-01-18 NOTE — Progress Notes (Signed)
Subjective:    Patient ID: Jill Phillips, female    DOB: 1956-03-25, 59 y.o.   MRN: [id number]  HPI   Jill Phillips is here in follow up of her chronic pain. Her pain levels are reasonable. She is still having problems sleeping. She falls asleep at night but then has difficulty waking up due to back spasms and RLS symptoms at night. She had success with CPAP last fall, but then it "stopped working" and she was changed go BIPAP which "didn't work either." now she tells me she has been told she doesn't OSA. In review of the chart there is concern about hypoventilation syndrome/ sleep related hypoxia. She has been dxed with "emphsyema" per pt.  She feels that she has no energy and that it's a chore to go to the store or to take her garbage out. She's had a low end ferritin level in the past. She hasn't had thyroid testing done recently. Her mood may play a role.    Pain Inventory Average Pain 4 Pain Right Now 6 My pain is aching and spasms  In the last 24 hours, has pain interfered with the following? General activity 3 Relation with others 8 Enjoyment of life 4 What TIME of day is your pain at its worst? morning, evening Sleep (in general) Good  Pain is worse with: walking, standing and some activites Pain improves with: rest and medication Relief from Meds: 10  Mobility Do you have any goals in this area?  no  Function Do you have any goals in this area?  no  Neuro/Psych No problems in this area  Prior Studies Any changes since last visit?  no  Physicians involved in your care Any changes since last visit?  no   Family History  Problem Relation Age of Onset  . Kidney disease Mother   . Heart disease Father   . Anuerysm Brother 29    brain  . Heart disease Brother   . Heart disease Sister 55    s/p CABG  . Hypertension Sister    History   Social History  . Marital Status: Divorced    Spouse Name: n/a  . Number of Children: 2  . Years of Education: 12+    Occupational History  . disability     back surgeries   Social History Main Topics  . Smoking status: Current Every Day Smoker -- 1.50 packs/day for 45 years    Types: Cigarettes  . Smokeless tobacco: Never Used     Comment: trying to quit  . Alcohol Use: No  . Drug Use: No  . Sexual Activity: Not on file   Other Topics Concern  . None   Social History Narrative   Lives alone.  One daughter is local, but is getting ready to move to [place], where her children live with their father.  The other daughter lives near [place], [place].   Past Surgical History  Procedure Laterality Date  . Appendectomy    . Abdominal hysterectomy    . Tubal ligation    . Spine surgery      thoracic x 1,  lumbar x 15  . Right hip replacement    . Knee surgeries r knee      arthroscopy- right  . Hammer toe surgery    . Sleep apnea surgery    . Cholecystectomy    . Joint replacement    . Total knee arthroplasty  05/29/2012    Procedure: TOTAL KNEE ARTHROPLASTY;  Surgeon: Mcarthur Rossetti, MD;  Location: [facility];  Service: Orthopedics;  Laterality: Right;  Right Total Knee Arthroplasty  . Back surgery      16 back surgeries  . Lumbar laminectomy/decompression microdiscectomy N/A 01/28/2014    Procedure: Minimally Invasive Right  L1-2 Microdiscectomy;  Surgeon: [name], MD;  Location: [facility];  Service: Orthopedics;  Laterality: N/A;   Past Medical History  Diagnosis Date  . Postlaminectomy syndrome, thoracic region   . Osteoarthrosis, unspecified whether generalized or localized, lower leg   . Dysthymic disorder   . Calcifying tendinitis of shoulder   . Pain in joint, upper arm   . Chronic pain syndrome   . Lumbago   . Primary localized osteoarthrosis, lower leg   . Hypertension   . Hyperlipidemia   . GERD (gastroesophageal reflux disease)   . Thyroid disease   . Restless leg syndrome   . PONV (postoperative nausea and vomiting)   . Heart murmur   . Sleep apnea     s/p  surgery- last sleep study 2011- doesnt use oxygen or machine at night as instructed  . History of blood transfusion 1980  . Hypothyroidism   . Active smoker    BP 122/76 mmHg  Pulse 91  Resp 14  SpO2 96%  Opioid Risk Score:   Fall Risk Score: Low Fall Risk (0-5 points)`1  Depression screen PHQ 2/9  Depression screen [facility] 2/9 11/24/2014 08/17/2014 06/08/2014 05/05/2014 01/03/2014 07/14/2013 05/19/2013  Decreased Interest 0 0 0 0 0 0 0  Down, Depressed, Hopeless 0 1 1 1  0 0 0  PHQ - 2 Score 0 1 1 1  0 0 0  Altered sleeping 3 - - - - - -  Tired, decreased energy 3 - - - - - -  Change in appetite 1 - - - - - -  Feeling bad or failure about yourself  0 - - - - - -  Trouble concentrating 0 - - - - - -  Moving slowly or fidgety/restless 0 - - - - - -  Suicidal thoughts 0 - - - - - -  PHQ-9 Score 7 - - - - - -     Review of Systems  All other systems reviewed and are negative.      Objective:   Physical Exam  Constitutional: She is oriented to person, place, and time. She appears well-developed and well-nourished.  HENT:  Head: Normocephalic.  Eyes: EOM are normal. Pupils are equal, round, and reactive to light.  Neck: Normal range of motion.  Cardiovascular: Normal rate.  Pulmonary/Chest: Effort normal.  Abdominal: Soft.  Musculoskeletal:  Right knee with minimal tenderness. Post-op scars noted. Knee rom 120+ with flexion. Weight bearing better with minimal antalgia seen.  Right shoulder with only mild tenderness with ROM. No shoulder instability noted. Had full AROM. Low back/pelvic with persistent hemi-elevation of the right pelvis and tightness of the lumbar paraspinals particularly on the right. Scar tissue likely around op site. Needs cueing to stand in extension. Able to almost touch toes when standing. Otherwise, she is limited with all plains of ROM. Neurological: She is alert and oriented to person, place, and time.  Skin: Skin is warm.  Psychiatric: She has a normal mood  and affect. Her behavior is normal. Judgment and thought content normal. Sometimes needs refocusing during conversation.   Assessment & Plan:   ASSESSMENT:  1. 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 Osteoarthritis of the  knees bilaterally, right greater than left.  3. Rotator cuff syndrome/subacromial bursitis.  4. Restless legs syndrome.  5. O2 dependent at night 6. Tobacco abuse.   7. Right leg length discrepancy.   PLAN:  1. Refilled fentanyl to 50 mcg every 72 hours. Hydrocodone was refilled.  2. Continue with 4mg  klonopin at night. Will also use gabapentin 300mg  qhs to assist with RLS symptoms. Provided further information regarding RLS as well.  3. Smoking cessation and importance was discussed. 4. Discussed the importance of posture, and regular exercise. Needs to be worked on daily.  5. Labs were collected today in relation to fatigue: thyroid panel, b12, folate, vitamin d levels, cbc. Consider further anemia testing pending CBC result- 6. Follow up with my NP in one month. 30 minutes of face to face patient care time were spent during this visit. All questions were encouraged and answered.

## 2015-01-18 NOTE — Telephone Encounter (Signed)
Labs reviewed.  Thyroid is low. Recommend increasing synthroid to 64mcg daily. #30, 4 RF   thanks

## 2015-01-18 NOTE — Patient Instructions (Signed)
NEED TO WORK ON CUTTING BACK YOUR SMOKING.  SMOKING CAN ALSO AFFECT YOUR SLEEP AND THE POTENCY OF YOUR MEDICATIONS.

## 2015-01-19 MED ORDER — LEVOTHYROXINE SODIUM 75 MCG PO TABS: 75.0000 ug | ORAL_TABLET | Freq: Every day | ORAL | Status: DC

## 2015-01-19 NOTE — Telephone Encounter (Signed)
Notified Jill Phillips of her thyroid being low and to start 75 mcg daily.  New order placed.  Awaiting vit D level.  B12/folate within normal but on low side and can take OTC vit b 12 or complex but be sure to avoid taking within 4 hours of the levothyroxine due to binding and decreasing efficacy of thyroid pill.

## 2015-01-21 LAB — VITAMIN D 1,25 DIHYDROXY
Vitamin D 1, 25 (OH)2 Total: 40 pg/mL (ref 18–72)
Vitamin D2 1, 25 (OH)2: 9 pg/mL
Vitamin D3 1, 25 (OH)2: 31 pg/mL

## 2015-01-23 ENCOUNTER — Telehealth: Payer: Self-pay | Admitting: Physical Medicine & Rehabilitation

## 2015-01-23 MED ORDER — LEVOTHYROXINE SODIUM 100 MCG PO TABS: 100.0000 ug | ORAL_TABLET | Freq: Every day | ORAL | Status: DC

## 2015-01-23 NOTE — Telephone Encounter (Signed)
Thyroid labs are low---have increased levothyroxine to 74mcg---escribed to pharmacy

## 2015-01-24 NOTE — Telephone Encounter (Signed)
Jill Phillips called and she is confused about her dose.  She received an rx for her thyroid 75 mcg and then 2 days later received another rx for 100 mcg.  She is wanting to know how bad her thyroid level is and why.

## 2015-01-24 NOTE — Telephone Encounter (Signed)
Jill Phillips was contacted on 01/19/15 and notified of her levels and her medication change.

## 2015-01-24 NOTE — Telephone Encounter (Signed)
i suppose nobody contacted her about dose change.  i should have indicated that i would like someone to call her.  i have called her and discussed

## 2015-01-30 ENCOUNTER — Other Ambulatory Visit: Payer: Self-pay | Admitting: Physical Medicine & Rehabilitation

## 2015-02-09 ENCOUNTER — Encounter: Payer: Self-pay | Admitting: Registered Nurse

## 2015-02-09 ENCOUNTER — Encounter: Payer: Medicare Other | Attending: Physical Medicine and Rehabilitation | Admitting: Registered Nurse

## 2015-02-09 VITALS — BP 125/74 | HR 96 | Resp 16

## 2015-02-09 DIAGNOSIS — Z720 Tobacco use: Secondary | ICD-10-CM

## 2015-02-09 DIAGNOSIS — Z5181 Encounter for therapeutic drug level monitoring: Secondary | ICD-10-CM

## 2015-02-09 DIAGNOSIS — G2581 Restless legs syndrome: Secondary | ICD-10-CM

## 2015-02-09 DIAGNOSIS — Z79899 Other long term (current) drug therapy: Secondary | ICD-10-CM

## 2015-02-09 DIAGNOSIS — M170 Bilateral primary osteoarthritis of knee: Secondary | ICD-10-CM | POA: Diagnosis not present

## 2015-02-09 DIAGNOSIS — M961 Postlaminectomy syndrome, not elsewhere classified: Secondary | ICD-10-CM

## 2015-02-09 DIAGNOSIS — Z760 Encounter for issue of repeat prescription: Secondary | ICD-10-CM | POA: Insufficient documentation

## 2015-02-09 DIAGNOSIS — M5126 Other intervertebral disc displacement, lumbar region: Secondary | ICD-10-CM

## 2015-02-09 DIAGNOSIS — G4730 Sleep apnea, unspecified: Secondary | ICD-10-CM | POA: Diagnosis not present

## 2015-02-09 DIAGNOSIS — G4734 Idiopathic sleep related nonobstructive alveolar hypoventilation: Secondary | ICD-10-CM

## 2015-02-09 DIAGNOSIS — E038 Other specified hypothyroidism: Secondary | ICD-10-CM

## 2015-02-09 MED ORDER — FENTANYL 50 MCG/HR TD PT72: 50.0000 ug | MEDICATED_PATCH | TRANSDERMAL | Status: DC

## 2015-02-09 MED ORDER — HYDROCODONE-ACETAMINOPHEN 10-325 MG PO TABS: 1.0000 | ORAL_TABLET | Freq: Four times a day (QID) | ORAL | Status: DC | PRN

## 2015-02-09 NOTE — Progress Notes (Signed)
Subjective:    Patient ID: Jill Phillips, female    DOB: [birth year], 59 y.o.   MRN: [id number]  HPI: Jill Phillips is a 59 year old female who returns for follow up for chronic pain and medication refill. She says her pain is located in her lower back and lower extremities. She rates her pain 6. Her current exercise regime is walking in her home and performing stretching exercises.  Notice during assessment she has two Fentanyl patches located on her back posteriorly right and left side. Upon asking her question she states " she placed the new Fentanyl patch on today, asked her to remove the old one she states she wanted to wait till she went home to check her calendar. I informed her this was a violation to the narcotic contract she removed the left Fenatnyl patch. Educated on medication compliance and adhering to prescription as ordered. She verbalizes understanding.  Pain Inventory Average Pain 5 Pain Right Now 6 My pain is dull and aching  In the last 24 hours, has pain interfered with the following? General activity 0 Relation with others 0 Enjoyment of life 0 What TIME of day is your pain at its worst? varies Sleep (in general) Good  Pain is worse with: walking, standing and some activites Pain improves with: rest and medication Relief from Meds: no answer  Mobility Do you have any goals in this area?  no  Function Do you have any goals in this area?  no  Neuro/Psych No problems in this area  Prior Studies Any changes since last visit?  no  Physicians involved in your care Any changes since last visit?  no   Family History  Problem Relation Age of Onset  . Kidney disease Mother   . Heart disease Father   . Anuerysm Brother 29    brain  . Heart disease Brother   . Heart disease Sister 57    s/p CABG  . Hypertension Sister    History   Social History  . Marital Status: Divorced    Spouse Name: n/a  . Number of Children: 2  . Years of Education: 12+     Occupational History  . disability     back surgeries   Social History Main Topics  . Smoking status: Current Every Day Smoker -- 1.50 packs/day for 45 years    Types: Cigarettes  . Smokeless tobacco: Never Used     Comment: trying to quit  . Alcohol Use: No  . Drug Use: No  . Sexual Activity: Not on file   Other Topics Concern  . None   Social History Narrative   Lives alone.  One daughter is local, but is getting ready to move to [place], where her children live with their father.  The other daughter lives near [place], [place].   Past Surgical History  Procedure Laterality Date  . Appendectomy    . Abdominal hysterectomy    . Tubal ligation    . Spine surgery      thoracic x 1,  lumbar x 15  . Right hip replacement    . Knee surgeries r knee      arthroscopy- right  . Hammer toe surgery    . Sleep apnea surgery    . Cholecystectomy    . Joint replacement    . Total knee arthroplasty  05/29/2012    Procedure: TOTAL KNEE ARTHROPLASTY;  Surgeon: Mcarthur Rossetti, MD;  Location: [facility];  Service: Orthopedics;  Laterality: Right;  Right Total Knee Arthroplasty  . Back surgery      16 back surgeries  . Lumbar laminectomy/decompression microdiscectomy N/A 01/28/2014    Procedure: Minimally Invasive Right  L1-2 Microdiscectomy;  Surgeon: [name], MD;  Location: [facility];  Service: Orthopedics;  Laterality: N/A;   Past Medical History  Diagnosis Date  . Postlaminectomy syndrome, thoracic region   . Osteoarthrosis, unspecified whether generalized or localized, lower leg   . Dysthymic disorder   . Calcifying tendinitis of shoulder   . Pain in joint, upper arm   . Chronic pain syndrome   . Lumbago   . Primary localized osteoarthrosis, lower leg   . Hypertension   . Hyperlipidemia   . GERD (gastroesophageal reflux disease)   . Thyroid disease   . Restless leg syndrome   . PONV (postoperative nausea and vomiting)   . Heart murmur   . Sleep apnea     s/p  surgery- last sleep study 2011- doesnt use oxygen or machine at night as instructed  . History of blood transfusion 1980  . Hypothyroidism   . Active smoker    BP 125/74 mmHg  Pulse 96  Resp 16  SpO2 98%  Opioid Risk Score:   Fall Risk Score: Moderate Fall Risk (6-13 points) (previously educated and given handout)`1  Depression screen PHQ 2/9  Depression screen [facility] 2/9 11/24/2014 08/17/2014 06/08/2014 05/05/2014 01/03/2014 07/14/2013 05/19/2013  Decreased Interest 0 0 0 0 0 0 0  Down, Depressed, Hopeless 0 1 1 1  0 0 0  PHQ - 2 Score 0 1 1 1  0 0 0  Altered sleeping 3 - - - - - -  Tired, decreased energy 3 - - - - - -  Change in appetite 1 - - - - - -  Feeling bad or failure about yourself  0 - - - - - -  Trouble concentrating 0 - - - - - -  Moving slowly or fidgety/restless 0 - - - - - -  Suicidal thoughts 0 - - - - - -  PHQ-9 Score 7 - - - - - -    Review of Systems  All other systems reviewed and are negative.      Objective:   Physical Exam  Constitutional: She is oriented to person, place, and time. She appears well-developed and well-nourished.  HENT:  Head: Normocephalic and atraumatic.  Neck: Normal range of motion. Neck supple.  Cardiovascular: Normal rate and regular rhythm.   Pulmonary/Chest: Effort normal and breath sounds normal.  Musculoskeletal:  Normal Muscle Bulk and Muscle Testing Reveals: Upper Extremities: Full ROM and Muscle Strength 5/5 Back without spinal or paraspinal tenderness Lower Extremities: Full ROM and Muscle strength 5/5 Arises from Chair with Ease Narrow Based gait  Neurological: She is alert and oriented to person, place, and time.  Skin: Skin is warm and dry.  Psychiatric: She has a normal mood and affect.  Nursing note and vitals reviewed.         Assessment & Plan:  1. Chronic lumbar spine pain/post-lami syndrome: Refilled: Fentanyl 50 MCG one patch every three days #10 and Hydrocodone 10/325 mg one tablet every 6 hours as  needed for sever pain #70 2. Osteoarthritis of the knees bilaterally:No complaints today. Continue with Heat, exercise and voltaren gel.  3. Rotator cuff syndrome/subacromial bursitis: No complaints voiced today. 4. Restless legs syndrome: Continue to monitor 5. Tobacco Abuse: Encourage Smoking Cessation: Pulmonologist Following 6. Emphysema:  Pulmonology Following  20 minutes of face to face patient care time was spent during this visit. All questions were encouraged and answered.   F/u in 1 month

## 2015-02-10 ENCOUNTER — Other Ambulatory Visit: Payer: Self-pay | Admitting: Registered Nurse

## 2015-02-10 DIAGNOSIS — M170 Bilateral primary osteoarthritis of knee: Secondary | ICD-10-CM | POA: Diagnosis not present

## 2015-02-10 DIAGNOSIS — E038 Other specified hypothyroidism: Secondary | ICD-10-CM | POA: Diagnosis not present

## 2015-02-10 DIAGNOSIS — G2581 Restless legs syndrome: Secondary | ICD-10-CM | POA: Diagnosis not present

## 2015-02-10 DIAGNOSIS — Z720 Tobacco use: Secondary | ICD-10-CM | POA: Diagnosis not present

## 2015-02-10 DIAGNOSIS — Z5181 Encounter for therapeutic drug level monitoring: Secondary | ICD-10-CM | POA: Diagnosis not present

## 2015-02-10 DIAGNOSIS — M961 Postlaminectomy syndrome, not elsewhere classified: Secondary | ICD-10-CM | POA: Diagnosis not present

## 2015-02-11 LAB — PMP ALCOHOL METABOLITE (ETG): Ethyl Glucuronide (EtG): NEGATIVE ng/mL

## 2015-02-14 ENCOUNTER — Telehealth: Payer: Self-pay | Admitting: *Deleted

## 2015-02-14 ENCOUNTER — Telehealth: Payer: Self-pay | Admitting: Internal Medicine

## 2015-02-14 NOTE — Telephone Encounter (Signed)
Pt called stating she feels like she has a UTI.  She has tried Azo without relief, also increase fluid intake. She has dysuria, no frequency or temp. Onset yesterday and very uncomfortable.  Pt does not have a car and can not come in today for a OV. She has a scheduled appointment for tomorrow in the clinic with PCP. Pt # [id number]

## 2015-02-14 NOTE — Telephone Encounter (Signed)
Agree with appt Thanks 

## 2015-02-14 NOTE — Telephone Encounter (Signed)
Pt informed, again I offered an appointment to if she can get a ride.

## 2015-02-14 NOTE — Telephone Encounter (Signed)
Call to patient to confirm appointment for 6/15 at 1:45 lmtcb

## 2015-02-15 ENCOUNTER — Encounter: Payer: Self-pay | Admitting: Internal Medicine

## 2015-02-15 ENCOUNTER — Ambulatory Visit (INDEPENDENT_AMBULATORY_CARE_PROVIDER_SITE_OTHER): Payer: Medicare Other | Admitting: Internal Medicine

## 2015-02-15 VITALS — BP 151/85 | HR 99 | Temp 98.2°F | Wt 149.2 lb

## 2015-02-15 DIAGNOSIS — E039 Hypothyroidism, unspecified: Secondary | ICD-10-CM | POA: Diagnosis not present

## 2015-02-15 DIAGNOSIS — I10 Essential (primary) hypertension: Secondary | ICD-10-CM

## 2015-02-15 DIAGNOSIS — R399 Unspecified symptoms and signs involving the genitourinary system: Secondary | ICD-10-CM | POA: Insufficient documentation

## 2015-02-15 DIAGNOSIS — G4734 Idiopathic sleep related nonobstructive alveolar hypoventilation: Secondary | ICD-10-CM | POA: Diagnosis not present

## 2015-02-15 DIAGNOSIS — E038 Other specified hypothyroidism: Secondary | ICD-10-CM

## 2015-02-15 DIAGNOSIS — R8299 Other abnormal findings in urine: Secondary | ICD-10-CM

## 2015-02-15 DIAGNOSIS — Z0000 Encounter for general adult medical examination without abnormal findings: Secondary | ICD-10-CM

## 2015-02-15 LAB — POCT URINALYSIS DIPSTICK
Blood, UA: NEGATIVE
Glucose, UA: NEGATIVE
Leukocytes, UA: NEGATIVE
Nitrite, UA: POSITIVE
Protein, UA: 30
Spec Grav, UA: 1.025
Urobilinogen, UA: 1
pH, UA: 5

## 2015-02-15 LAB — OPIATES/OPIOIDS (LC/MS-MS)
Codeine Urine: NEGATIVE ng/mL (ref <50)
Hydrocodone: 112 ng/mL (ref <50)
Hydromorphone: 123 ng/mL (ref <50)
Morphine Urine: NEGATIVE ng/mL (ref <50)
Norhydrocodone, Ur: 2844 ng/mL (ref <50)
Noroxycodone, Ur: NEGATIVE ng/mL (ref <50)
Oxycodone, ur: NEGATIVE ng/mL (ref <50)
Oxymorphone: NEGATIVE ng/mL (ref <50)

## 2015-02-15 LAB — BENZODIAZEPINES (GC/LC/MS), URINE
Alprazolam metabolite (GC/LC/MS), ur confirm: NEGATIVE ng/mL — AB (ref <25)
Clonazepam metabolite (GC/LC/MS), ur confirm: 512 ng/mL (ref <25)
Flurazepam metabolite (GC/LC/MS), ur confirm: NEGATIVE ng/mL (ref <50)
Lorazepam (GC/LC/MS), ur confirm: NEGATIVE ng/mL (ref <50)
Midazolam (GC/LC/MS), ur confirm: NEGATIVE ng/mL (ref <50)
Nordiazepam (GC/LC/MS), ur confirm: NEGATIVE ng/mL (ref <50)
Oxazepam (GC/LC/MS), ur confirm: NEGATIVE ng/mL (ref <50)
Temazepam (GC/LC/MS), ur confirm: NEGATIVE ng/mL (ref <50)
Triazolam metabolite (GC/LC/MS), ur confirm: NEGATIVE ng/mL (ref <50)

## 2015-02-15 LAB — BASIC METABOLIC PANEL WITH GFR
BUN: 19 mg/dL (ref 6–23)
CO2: 28 mEq/L (ref 19–32)
Calcium: 10.4 mg/dL (ref 8.4–10.5)
Chloride: 103 mEq/L (ref 96–112)
Creat: 1.13 mg/dL — ABNORMAL HIGH (ref 0.50–1.10)
GFR, Est African American: 61 mL/min
GFR, Est Non African American: 53 mL/min — ABNORMAL LOW
Glucose, Bld: 90 mg/dL (ref 70–99)
Potassium: 4.5 mEq/L (ref 3.5–5.3)
Sodium: 143 mEq/L (ref 135–145)

## 2015-02-15 LAB — FENTANYL (GC/LC/MS), URINE
Fentanyl, confirm: 162.3 ng/mL (ref <0.5)
Norfentanyl, confirm: 731.7 ng/mL (ref <0.5)

## 2015-02-15 LAB — CARISOPRODOL (GC/LC/MS), URINE: Meprobamate (GC/LC/MS), ur confirm: 10068 ng/mL (ref <1000)

## 2015-02-15 MED ORDER — LEVOTHYROXINE SODIUM 50 MCG PO TABS: 50.0000 ug | ORAL_TABLET | Freq: Every day | ORAL | Status: DC

## 2015-02-15 MED ORDER — CIPROFLOXACIN HCL 250 MG PO TABS: 250.0000 mg | ORAL_TABLET | Freq: Two times a day (BID) | ORAL | Status: DC

## 2015-02-15 NOTE — Progress Notes (Signed)
Subjective:    Patient ID: Jill Phillips, female    DOB: [birth year], 58 y.o.   MRN: [id number]  HPI Jill Phillips is a 59 yo female with PMHx of HTN, Hypothyroidism, depression, chronic pain, and emphysema who presents for follow up for her hypertension. Please see problem based assessment and plan for more information.  Review of Systems General: Denies fever, chills, change in appetite and diaphoresis.  Respiratory: Denies SOB, cough, DOE Cardiovascular: Denies chest pain and palpitations.  Gastrointestinal: Denies nausea, vomiting, abdominal pain, blood in stool and abdominal distention.  Genitourinary: Admits to suprapubic pain and increased urinary frequency. Denies dysuria, urgency, hematuria, and flank pain. Musculoskeletal: Admits to chronic back pain. Denies myalgias, joint swelling  Neurological: Denies dizziness, weakness, lightheadedness, numbness  Past Medical History  Diagnosis Date  . Postlaminectomy syndrome, thoracic region   . Osteoarthrosis, unspecified whether generalized or localized, lower leg   . Dysthymic disorder   . Calcifying tendinitis of shoulder   . Pain in joint, upper arm   . Chronic pain syndrome   . Lumbago   . Primary localized osteoarthrosis, lower leg   . Hypertension   . Hyperlipidemia   . GERD (gastroesophageal reflux disease)   . Thyroid disease   . Restless leg syndrome   . PONV (postoperative nausea and vomiting)   . Heart murmur   . Sleep apnea     s/p surgery- last sleep study 2011- doesnt use oxygen or machine at night as instructed  . History of blood transfusion 1980  . Hypothyroidism   . Active smoker    Outpatient Encounter Prescriptions as of 02/15/2015  Medication Sig  . acetaminophen (TYLENOL) 500 MG tablet Take 1,000 mg by mouth every 6 (six) hours as needed for moderate pain. For pain  . albuterol (PROVENTIL HFA;VENTOLIN HFA) 108 (90 BASE) MCG/ACT inhaler Inhale 1 puff into the lungs every 6 (six) hours as needed for  wheezing or shortness of breath.  . ALPRAZolam (XANAX) 1 MG tablet Take 1 tablet by mouth daily as needed for anxiety.   [name] amLODipine (NORVASC) 10 MG tablet TAKE 1 TABLET (10 MG TOTAL) BY MOUTH DAILY.  . benazepril (LOTENSIN) 40 MG tablet TAKE 1 TABLET BY MOUTH DAILY.  . carisoprodol (SOMA) 350 MG tablet TAKE 1 TABLET BY MOUTH EVERY 8 HOURS AS NEEDED.  . clonazePAM (KLONOPIN) 2 MG tablet TAKE 1 TABLET BY MOUTH TWICE DAILY AS NEEDED  . fentaNYL (DURAGESIC) 50 MCG/HR Place 1 patch (50 mcg total) onto the skin every 3 (three) days.  . fluticasone (FLONASE) 50 MCG/ACT nasal spray Place 2 sprays into both nostrils daily. (Patient taking differently: Place 2 sprays into both nostrils daily as needed for allergies. )  . gabapentin (NEURONTIN) 300 MG capsule Take 1 capsule (300 mg total) by mouth at bedtime.  . hydrochlorothiazide (HYDRODIURIL) 12.5 MG tablet Take 1 tablet by mouth daily.  [name] HYDROcodone-acetaminophen (NORCO) 10-325 MG per tablet Take 1 tablet by mouth every 6 (six) hours as needed for severe pain.  [name] levothyroxine (SYNTHROID, LEVOTHROID) 100 MCG tablet Take 1 tablet (100 mcg total) by mouth daily before breakfast.  . naproxen (NAPROSYN) 500 MG tablet Take 750 mg by mouth at bedtime as needed for moderate pain.  . nicotine (NICODERM CQ - DOSED IN MG/24 HOURS) 14 mg/24hr patch Place 1 patch (14 mg total) onto the skin daily.  [name] omeprazole (PRILOSEC) 20 MG capsule TAKE 1 CAPSULE BY MOUTH 2 TIMES DAILY BEFORE A MEAL.  . pravastatin (PRAVACHOL)  40 MG tablet TAKE 1 TABLET BY MOUTH DAILY.  [name] promethazine (PHENERGAN) 25 MG tablet Take 1 tablet by mouth as needed.  [name] QUEtiapine (SEROQUEL) 50 MG tablet Take 50 mg by mouth.  . tiotropium (SPIRIVA) 18 MCG inhalation capsule Place 1 capsule (18 mcg total) into inhaler and inhale daily.  . valACYclovir (VALTREX) 500 MG tablet TAKE 1 TABLET BY MOUTH DAILY.  [name] venlafaxine XR (EFFEXOR-XR) 150 MG 24 hr capsule Take 150 mg by mouth daily.  . VOLTAREN 1 %  GEL APPLY 2 GRAMS TO AFFECTED AREA 3 TIMES DAILY AS NEEDED FOR PAIN.   No facility-administered encounter medications on file as of 02/15/2015.      Objective:   Physical Exam Filed Vitals:   02/15/15 1407  BP: 151/85  Pulse: 99  Temp: 98.2 F (36.8 C)  TempSrc: Oral  Weight: 149 lb 3.2 oz (67.677 kg)  SpO2: 96%   General: Vital signs reviewed.  Patient is well-developed and well-nourished, in no acute distress and cooperative with exam.  Cardiovascular: RRR, S1 normal, S2 normal, no murmurs, gallops, or rubs. Pulmonary/Chest: Clear to auscultation bilaterally, no wheezes, rales, or rhonchi. Abdominal: Soft, non-tender, non-distended, BS +. Negative Lloyds sign. Extremities: No lower extremity edema bilaterally,  pulses symmetric and intact bilaterally Skin: Warm, dry and intact. No rashes or erythema. Psychiatric: Normal mood and affect. speech and behavior is normal. Cognition and memory are normal.     Assessment & Plan:   Please see problem based assessment and plan.

## 2015-02-15 NOTE — Assessment & Plan Note (Signed)
Stool cards provided to patient. Patient would like to wait on mammogram and receive referral at follow up visit. Counseled on tobacco cessation.

## 2015-02-15 NOTE — Patient Instructions (Addendum)
General Instructions:   Please bring your medicines with you each time you come to clinic.  Medicines may include prescription medications, over-the-counter medications, herbal remedies, eye drops, vitamins, or other pills.   TAKE SYNTHROID (LEVOTHYROXINE) 50 MCG DAILY. RETURN IN 6 WEEKS (BEGINNING OF AUGUST) FOR A LAB CHECK.   PLEASE REMEMBER TO SCHEDULE YOUR MAMMOGRAM.  PLEASE REMEMBER TO USE THE STOOL CARDS AND MAIL THEM BACK IN.   TAKE CIPROFLOXACIN 250 MG TWICE A DAY FOR 3 DAYS. RETURN IF YOUR SYMPTOMS DO NOT GET BETTER.  CONTINUE TO TRY TO CUT DOWN ON SMOKING.  Smoking Cessation Quitting smoking is important to your health and has many advantages. However, it is not always easy to quit since nicotine is a very addictive drug. Oftentimes, people try 3 times or more before being able to quit. This document explains the best ways for you to prepare to quit smoking. Quitting takes hard work and a lot of effort, but you can do it. ADVANTAGES OF QUITTING SMOKING  You will live longer, feel better, and live better.  Your body will feel the impact of quitting smoking almost immediately.  Within 20 minutes, blood pressure decreases. Your pulse returns to its normal level.  After 8 hours, carbon monoxide levels in the blood return to normal. Your oxygen level increases.  After 24 hours, the chance of having a heart attack starts to decrease. Your breath, hair, and body stop smelling like smoke.  After 48 hours, damaged nerve endings begin to recover. Your sense of taste and smell improve.  After 72 hours, the body is virtually free of nicotine. Your bronchial tubes relax and breathing becomes easier.  After 2 to 12 weeks, lungs can hold more air. Exercise becomes easier and circulation improves.  The risk of having a heart attack, stroke, cancer, or lung disease is greatly reduced.  After 1 year, the risk of coronary heart disease is cut in half.  After 5 years, the risk of stroke  falls to the same as a nonsmoker.  After 10 years, the risk of lung cancer is cut in half and the risk of other cancers decreases significantly.  After 15 years, the risk of coronary heart disease drops, usually to the level of a nonsmoker.  If you are pregnant, quitting smoking will improve your chances of having a healthy baby.  The people you live with, especially any children, will be healthier.  You will have extra money to spend on things other than cigarettes. QUESTIONS TO THINK ABOUT BEFORE ATTEMPTING TO QUIT You may want to talk about your answers with your health care provider.  Why do you want to quit?  If you tried to quit in the past, what helped and what did not?  What will be the most difficult situations for you after you quit? How will you plan to handle them?  Who can help you through the tough times? Your family? Friends? A health care provider?  What pleasures do you get from smoking? What ways can you still get pleasure if you quit? Here are some questions to ask your health care provider:  How can you help me to be successful at quitting?  What medicine do you think would be best for me and how should I take it?  What should I do if I need more help?  What is smoking withdrawal like? How can I get information on withdrawal? GET READY  Set a quit date.  Change your environment by getting rid  of all cigarettes, ashtrays, matches, and lighters in your home, car, or work. Do not let people smoke in your home.  Review your past attempts to quit. Think about what worked and what did not. GET SUPPORT AND ENCOURAGEMENT You have a better chance of being successful if you have help. You can get support in many ways.  Tell your family, friends, and coworkers that you are going to quit and need their support. Ask them not to smoke around you.  Get individual, group, or telephone counseling and support. Programs are available at [employer] and health centers.  Call your local health department for information about programs in your area.  Spiritual beliefs and practices may help some smokers quit.  Download a "quit meter" on your computer to keep track of quit statistics, such as how long you have gone without smoking, cigarettes not smoked, and money saved.  Get a self-help book about quitting smoking and staying off tobacco. [place] yourself from urges to smoke. Talk to someone, go for a walk, or occupy your time with a task.  Change your normal routine. Take a different route to work. Drink tea instead of coffee. Eat breakfast in a different place.  Reduce your stress. Take a hot bath, exercise, or read a book.  Plan something enjoyable to do every day. Reward yourself for not smoking.  Explore interactive web-based programs that specialize in helping you quit. GET MEDICINE AND USE IT CORRECTLY Medicines can help you stop smoking and decrease the urge to smoke. Combining medicine with the above behavioral methods and support can greatly increase your chances of successfully quitting smoking.  Nicotine replacement therapy helps deliver nicotine to your body without the negative effects and risks of smoking. Nicotine replacement therapy includes nicotine gum, lozenges, inhalers, nasal sprays, and skin patches. Some may be available over-the-counter and others require a prescription.  Antidepressant medicine helps people abstain from smoking, but how this works is unknown. This medicine is available by prescription.  Nicotinic receptor partial agonist medicine simulates the effect of nicotine in your brain. This medicine is available by prescription. Ask your health care provider for advice about which medicines to use and how to use them based on your health history. Your health care provider will tell you what side effects to look out for if you choose to be on a medicine or therapy. Carefully read the  information on the package. Do not use any other product containing nicotine while using a nicotine replacement product.  RELAPSE OR DIFFICULT SITUATIONS Most relapses occur within the first 3 months after quitting. Do not be discouraged if you start smoking again. Remember, most people try several times before finally quitting. You may have symptoms of withdrawal because your body is used to nicotine. You may crave cigarettes, be irritable, feel very hungry, cough often, get headaches, or have difficulty concentrating. The withdrawal symptoms are only temporary. They are strongest when you first quit, but they will go away within 10-14 days. To reduce the chances of relapse, try to:  Avoid drinking alcohol. Drinking lowers your chances of successfully quitting.  Reduce the amount of caffeine you consume. Once you quit smoking, the amount of caffeine in your body increases and can give you symptoms, such as a rapid heartbeat, sweating, and anxiety.  Avoid smokers because they can make you want to smoke.  Do not let weight gain distract you. Many smokers will gain weight when they quit, usually less  than 10 pounds. Eat a healthy diet and stay active. You can always lose the weight gained after you quit.  Find ways to improve your mood other than smoking. FOR MORE INFORMATION  www.smokefree.gov  Document Released: 08/13/2001 Document Revised: 01/03/2014 Document Reviewed: 11/28/2011 [facility] Patient Information 2015 [place], [place]. This information is not intended to replace advice given to you by your health care provider. Make sure you discuss any questions you have with your health care provider.

## 2015-02-15 NOTE — Assessment & Plan Note (Signed)
TSH recently checked for fatigue. TSH > 4 and Free T4 was normal. Patient's synthroid was increased from 50 mcg daily to 100 mcg daily. However, I believe this is too much for her. Patient was previously well controlled on 50 mcg daily. Patient did admit to frequently missing her synthroid dose in the morning (7x in one month).   Plan: -Decrease synthroid to 50 mcg daily -Return in about 6 weeks for TSH check

## 2015-02-15 NOTE — Assessment & Plan Note (Signed)
Patient complains of suprapubic pain and increased urinary frequency for 3 days. She denies dysuria (however has been taking Azo), denies fever, chills, nausea, vomiting, flank pain, or urgency. Urine dipstick reveals positive nitrites, but negative leukocytes. Will treat as patient states her UTIs typically feel this way. Will given ciprofloxacin has patient has done well with this in the past and has an allergy to sulfa drugs.  Plan: Ciprofloxacin 250 mg BID for 3 days Return if symptoms do not improve

## 2015-02-15 NOTE — Assessment & Plan Note (Signed)
BP Readings from Last 3 Encounters:  02/15/15 151/85  02/09/15 125/74  01/18/15 122/76    Lab Results  Component Value Date   NA 144 01/20/2014   K 3.8 01/20/2014   CREATININE 1.02 01/20/2014    Assessment: Blood pressure control:   Above goal Progress toward BP goal:   Previously well controlled Comments: Compliant with amlodipine 10 mg daily, benazepril 40 mg daily, and HCTZ 12.5 mg daily.   Plan: Medications:  continue current medications given this is a one time elevation Educational resources provided: brochure Self management tools provided: home blood pressure logbook Other plans: check BMET, consider increasing HCTZ if BP elevated at next visit

## 2015-02-16 LAB — PRESCRIPTION MONITORING PROFILE (SOLSTAS)
Amphetamine/Meth: NEGATIVE ng/mL
Barbiturate Screen, Urine: NEGATIVE ng/mL
Buprenorphine, Urine: NEGATIVE ng/mL
Cannabinoid Scrn, Ur: NEGATIVE ng/mL
Cocaine Metabolites: NEGATIVE ng/mL
Creatinine, Urine: 170.16 mg/dL (ref >20.0)
MDMA URINE: NEGATIVE ng/mL
Meperidine, Ur: NEGATIVE ng/mL
Methadone Screen, Urine: NEGATIVE ng/mL
Nitrites, Initial: NEGATIVE ug/mL
Oxycodone Screen, Ur: NEGATIVE ng/mL
Propoxyphene: NEGATIVE ng/mL
Tapentadol, urine: NEGATIVE ng/mL
Tramadol Scrn, Ur: NEGATIVE ng/mL
Zolpidem, Urine: NEGATIVE ng/mL
pH, Initial: 5.4 pH (ref 4.5–8.9)

## 2015-02-16 LAB — URINALYSIS, MICROSCOPIC ONLY
Casts: NONE SEEN
Crystals: NONE SEEN
Squamous Epithelial / LPF: NONE SEEN

## 2015-02-16 LAB — URINALYSIS, ROUTINE W REFLEX MICROSCOPIC
Glucose, UA: NEGATIVE mg/dL
Hgb urine dipstick: NEGATIVE
Leukocytes, UA: NEGATIVE
Nitrite: POSITIVE — AB
Protein, ur: NEGATIVE mg/dL
Specific Gravity, Urine: 1.012 (ref 1.005–1.030)
Urobilinogen, UA: 1 mg/dL (ref 0.0–1.0)
pH: 5 (ref 5.0–8.0)

## 2015-02-16 LAB — HIV ANTIBODY (ROUTINE TESTING W REFLEX): HIV 1&2 Ab, 4th Generation: NONREACTIVE

## 2015-02-17 NOTE — Progress Notes (Signed)
Internal Medicine Clinic Attending  Case discussed with Dr. Richardson at the time of the visit.  We reviewed the resident's history and exam and pertinent patient test results.  I agree with the assessment, diagnosis, and plan of care documented in the resident's note. 

## 2015-02-18 ENCOUNTER — Telehealth: Payer: Self-pay | Admitting: Internal Medicine

## 2015-02-18 NOTE — Telephone Encounter (Signed)
  Reason for call:   I placed an outgoing call to [name] at 6:30PM today but she did not answer.  She paged the on call pager 3 times today.  I spoke with the operator who verified that she offered to transfer her to the nurse line but she declined.    Assessment/ Plan:   Instructed the operator to advise the patient that I attempted to return her call and to come to the ED for any life-threatening emergencies.    As always, pt is advised that if symptoms worsen or new symptoms arise, they should go to an urgent care facility or to to ER for further evaluation.   Jill Bales, MD   02/18/2015, 7:39 PM

## 2015-02-20 ENCOUNTER — Other Ambulatory Visit: Payer: Self-pay | Admitting: *Deleted

## 2015-02-22 MED ORDER — NAPROXEN 500 MG PO TABS: 500.0000 mg | ORAL_TABLET | Freq: Every evening | ORAL | Status: DC | PRN

## 2015-03-01 NOTE — Progress Notes (Signed)
Urine drug screen for this encounter is consistent for prescribed medication 

## 2015-03-08 ENCOUNTER — Telehealth: Payer: Self-pay | Admitting: *Deleted

## 2015-03-08 NOTE — Telephone Encounter (Signed)
Jill Phillips called because of her restless legs and them hurting.  She went to stay with daughter and her legs were hurting so bad she has had to take her medications q 4 hours and will run out early.  She wants to know if there is something else Dr Naaman Plummer can do. (she has been warned not to take more medication than prescribed--see last visit with Zella Ball --she had on two fentanyl patches instead of one)

## 2015-03-09 MED ORDER — PRAMIPEXOLE DIHYDROCHLORIDE 0.125 MG PO TABS: ORAL_TABLET | ORAL | Status: DC

## 2015-03-09 NOTE — Telephone Encounter (Signed)
1. Pt needs to be given formal, written warning that she is NOT to self-prescribe,adjust her regimen  2. Mirapex 0.125mg  ---qhs for 3 days then bid thereafter, #60, 2 RF

## 2015-03-09 NOTE — Telephone Encounter (Signed)
Warning letter written and medication order placed.  Attempted to reach Jill Phillips but there was no answer and message left to call.

## 2015-03-10 NOTE — Telephone Encounter (Signed)
Jill Phillips called back this morning and I told her about the medication we sent in to the pharmacy.  I also told her about her letter that would be coming and to expect it with a formal warning if she takes more of her medication more often again, she will be discharged.  She went on about how much pain she is in but I reminded her of the large amount of various medications she is on and and there comes a point where you keep taking more and more and you stop breathing. She cannot understand that she should not take more if she hurts more.  I have made it clear that Dr Naaman Plummer was not happy with her escalating her medications especially since she was warned at last visit by [facility].  She wanted to know if she was seeing him next time and I told her it was with Zella Ball.  Her comment was " I hope we don't have a pissing contest because I will be in no mood.I have already been chewed out by you this morning"   I told her that because she has been told before on many occasions, I need her to take this warning seriously. She said she has thought about looking for a new pain doctor.  I told her that is always her right to do so.

## 2015-03-13 DIAGNOSIS — T24211D Burn of second degree of right thigh, subsequent encounter: Secondary | ICD-10-CM | POA: Diagnosis not present

## 2015-03-13 DIAGNOSIS — M25551 Pain in right hip: Secondary | ICD-10-CM | POA: Diagnosis not present

## 2015-03-13 DIAGNOSIS — M25552 Pain in left hip: Secondary | ICD-10-CM | POA: Diagnosis not present

## 2015-03-13 DIAGNOSIS — H04123 Dry eye syndrome of bilateral lacrimal glands: Secondary | ICD-10-CM | POA: Diagnosis not present

## 2015-03-14 ENCOUNTER — Other Ambulatory Visit: Payer: Self-pay | Admitting: Specialist

## 2015-03-14 DIAGNOSIS — M545 Low back pain: Secondary | ICD-10-CM

## 2015-03-15 ENCOUNTER — Telehealth: Payer: Self-pay | Admitting: *Deleted

## 2015-03-15 NOTE — Telephone Encounter (Signed)
Jill Phillips called.  She saw Dr [name] on 03/13/15 and she is going to have to have surgery on her back.  She says he will place about 10 screws and 2 rods L1-L5.  She does not know the date at present.  She was asking about when she is under his care for her medications.  I explained that typically it is assumed by Dr [name] in the post op period until he releases her from a surgical standpoint.  She is asking if Dr Naaman Plummer will consider increasing her medication until surgery.

## 2015-03-15 NOTE — Telephone Encounter (Signed)
She may have 90 hydrocodone per month---that is the only change i'm willing to make----her medication levels are already critically high

## 2015-03-15 NOTE — Telephone Encounter (Signed)
She sees [place] on Friday and can get the medication at that appt.   I have notified Kaiyla.

## 2015-03-17 ENCOUNTER — Encounter: Payer: Self-pay | Admitting: Registered Nurse

## 2015-03-17 ENCOUNTER — Encounter: Payer: Medicare Other | Attending: Physical Medicine and Rehabilitation | Admitting: Registered Nurse

## 2015-03-17 VITALS — BP 141/86 | HR 99 | Resp 14

## 2015-03-17 DIAGNOSIS — Z5181 Encounter for therapeutic drug level monitoring: Secondary | ICD-10-CM

## 2015-03-17 DIAGNOSIS — Z760 Encounter for issue of repeat prescription: Secondary | ICD-10-CM | POA: Diagnosis not present

## 2015-03-17 DIAGNOSIS — M961 Postlaminectomy syndrome, not elsewhere classified: Secondary | ICD-10-CM

## 2015-03-17 DIAGNOSIS — G4730 Sleep apnea, unspecified: Secondary | ICD-10-CM | POA: Insufficient documentation

## 2015-03-17 DIAGNOSIS — G2581 Restless legs syndrome: Secondary | ICD-10-CM

## 2015-03-17 DIAGNOSIS — M5126 Other intervertebral disc displacement, lumbar region: Secondary | ICD-10-CM | POA: Diagnosis not present

## 2015-03-17 DIAGNOSIS — Z79899 Other long term (current) drug therapy: Secondary | ICD-10-CM

## 2015-03-17 DIAGNOSIS — Z720 Tobacco use: Secondary | ICD-10-CM

## 2015-03-17 DIAGNOSIS — G4734 Idiopathic sleep related nonobstructive alveolar hypoventilation: Secondary | ICD-10-CM | POA: Diagnosis not present

## 2015-03-17 MED ORDER — FENTANYL 50 MCG/HR TD PT72: 50.0000 ug | MEDICATED_PATCH | TRANSDERMAL | Status: DC

## 2015-03-17 MED ORDER — HYDROCODONE-ACETAMINOPHEN 10-325 MG PO TABS: 1.0000 | ORAL_TABLET | Freq: Three times a day (TID) | ORAL | Status: DC | PRN

## 2015-03-17 NOTE — Progress Notes (Signed)
Subjective:    Patient ID: Jill Phillips, female    DOB: 1955/10/15, 59 y.o.   MRN: [id number]  HPI: [name] is a 59 year old female who returns for follow up for chronic pain and medication refill. She says her pain is located in her mid- lower back and lower extremities. She rates her pain 6. Her current exercise regime is walking in her home. Reviewed her medication's and educated on compliaince and adherance she verbalizes understanding.  Also states she will be having back surgery by Dr. Morton Stall she doesn't have a date at this time.  Pain Inventory Average Pain 5 Pain Right Now 6 My pain is aching  In the last 24 hours, has pain interfered with the following? General activity 0 Relation with others 0 Enjoyment of life 0 What TIME of day is your pain at its worst? all Sleep (in general) Fair  Pain is worse with: walking, bending, sitting, standing and some activites Pain improves with: medication Relief from Meds: 9  Mobility walk without assistance Do you have any goals in this area?  yes  Function disabled: date disabled . Do you have any goals in this area?  no  Neuro/Psych trouble walking spasms  Prior Studies Any changes since last visit?  no  Physicians involved in your care Any changes since last visit?  no   Family History  Problem Relation Age of Onset  . Kidney disease Mother   . Heart disease Father   . Anuerysm Brother 29    brain  . Heart disease Brother   . Heart disease Sister 74    s/p CABG  . Hypertension Sister    History   Social History  . Marital Status: Divorced    Spouse Name: n/a  . Number of Children: 2  . Years of Education: 12+   Occupational History  . disability     back surgeries   Social History Main Topics  . Smoking status: Current Every Day Smoker -- 1.50 packs/day for 45 years    Types: Cigarettes  . Smokeless tobacco: Never Used     Comment: trying to quit  . Alcohol Use: No  . Drug Use: No  .  Sexual Activity: Not on file   Other Topics Concern  . None   Social History Narrative   Lives alone.  One daughter is local, but is getting ready to move to [place], where her children live with their father.  The other daughter lives near [place], [place].   Past Surgical History  Procedure Laterality Date  . Appendectomy    . Abdominal hysterectomy    . Tubal ligation    . Spine surgery      thoracic x 1,  lumbar x 15  . Right hip replacement    . Knee surgeries r knee      arthroscopy- right  . Hammer toe surgery    . Sleep apnea surgery    . Cholecystectomy    . Joint replacement    . Total knee arthroplasty  05/29/2012    Procedure: TOTAL KNEE ARTHROPLASTY;  Surgeon: Mcarthur Rossetti, MD;  Location: [facility];  Service: Orthopedics;  Laterality: Right;  Right Total Knee Arthroplasty  . Back surgery      16 back surgeries  . Lumbar laminectomy/decompression microdiscectomy N/A 01/28/2014    Procedure: Minimally Invasive Right  L1-2 Microdiscectomy;  Surgeon: [name], MD;  Location: [place];  Service: Orthopedics;  Laterality:  N/A;   Past Medical History  Diagnosis Date  . Postlaminectomy syndrome, thoracic region   . Osteoarthrosis, unspecified whether generalized or localized, lower leg   . Dysthymic disorder   . Calcifying tendinitis of shoulder   . Pain in joint, upper arm   . Chronic pain syndrome   . Lumbago   . Primary localized osteoarthrosis, lower leg   . Hypertension   . Hyperlipidemia   . GERD (gastroesophageal reflux disease)   . Thyroid disease   . Restless leg syndrome   . PONV (postoperative nausea and vomiting)   . Heart murmur   . Sleep apnea     s/p surgery- last sleep study 2011- doesnt use oxygen or machine at night as instructed  . History of blood transfusion 1980  . Hypothyroidism   . Active smoker    BP 141/86 mmHg  Pulse 99  Resp 14  SpO2 94%  Opioid Risk Score:   Fall Risk Score: Moderate Fall Risk (6-13  points)`1  Depression screen PHQ 2/9  Depression screen [facility] 2/9 02/15/2015 11/24/2014 08/17/2014 06/08/2014 05/05/2014 01/03/2014 07/14/2013  Decreased Interest 0 0 0 0 0 0 0  Down, Depressed, Hopeless 0 0 1 1 1  0 0  PHQ - 2 Score 0 0 1 1 1  0 0  Altered sleeping - 3 - - - - -  Tired, decreased energy - 3 - - - - -  Change in appetite - 1 - - - - -  Feeling bad or failure about yourself  - 0 - - - - -  Trouble concentrating - 0 - - - - -  Moving slowly or fidgety/restless - 0 - - - - -  Suicidal thoughts - 0 - - - - -  PHQ-9 Score - 7 - - - - -     Review of Systems  Musculoskeletal: Positive for gait problem.  Neurological:       Spasms   All other systems reviewed and are negative.      Objective:   Physical Exam  Constitutional: She is oriented to person, place, and time. She appears well-developed and well-nourished.  HENT:  Head: Normocephalic and atraumatic.  Neck: Normal range of motion. Neck supple.  Cardiovascular: Normal rate and regular rhythm.   Pulmonary/Chest: Effort normal and breath sounds normal.  Musculoskeletal:  Normal Muscle Bulk and Muscle Testing Reveals: Upper Extremities: Full ROM and Muscle Strength 5/5 Lumbar Paraspinal Tenderness: L-3- L-5 Lower Extremities: Full ROM and Muscle Strength 5/5Right Lower Extremity Flexion Produces pain into Popliteal Fossa  Arises from chair with ease Antalgic Gait  Neurological: She is alert and oriented to person, place, and time.  Skin: Skin is warm and dry.  Psychiatric: She has a normal mood and affect.  Nursing note and vitals reviewed.         Assessment & Plan:  1. Chronic lumbar spine pain/post-lami syndrome: Refilled: Fentanyl 50 MCG one patch every three days #10 and Hydrocodone 10/325 mg one tablet every 8 hours as needed for sever pain Increased to #90. 2. Osteoarthritis of the knees bilaterally:No complaints today. Continue with Heat, exercise and voltaren gel.  3. Rotator cuff  syndrome/subacromial bursitis: No complaints voiced today. 4. Restless legs syndrome: Continue to monitor 5. Tobacco Abuse: Encourage Smoking Cessation: Pulmonologist Following 6. Emphysema: Pulmonology Following  20 minutes of face to face patient care time was spent during this visit. All questions were encouraged and answered.   F/u in 1 month

## 2015-03-23 ENCOUNTER — Telehealth: Payer: Self-pay | Admitting: *Deleted

## 2015-03-23 NOTE — Telephone Encounter (Signed)
Miss Brice said she was told by Zella Ball that the next time she comes in, there should be 21 tablets left. She contends that because there 31 days this month that actually her pill count should be 18 instead of 21....that is all she said

## 2015-03-24 ENCOUNTER — Other Ambulatory Visit: Payer: Self-pay | Admitting: Physical Medicine & Rehabilitation

## 2015-03-27 ENCOUNTER — Other Ambulatory Visit: Payer: Self-pay | Admitting: *Deleted

## 2015-03-29 ENCOUNTER — Other Ambulatory Visit: Payer: Self-pay | Admitting: Physical Medicine & Rehabilitation

## 2015-03-29 DIAGNOSIS — M810 Age-related osteoporosis without current pathological fracture: Secondary | ICD-10-CM | POA: Diagnosis not present

## 2015-03-29 DIAGNOSIS — Z780 Asymptomatic menopausal state: Secondary | ICD-10-CM | POA: Diagnosis not present

## 2015-03-30 ENCOUNTER — Other Ambulatory Visit: Payer: Self-pay | Admitting: *Deleted

## 2015-03-30 ENCOUNTER — Ambulatory Visit
Admission: RE | Admit: 2015-03-30 | Discharge: 2015-03-30 | Disposition: A | Discharge status: Home / Self Care | Payer: Medicare Other | Source: Ambulatory Visit | Attending: Specialist | Admitting: Specialist

## 2015-03-30 DIAGNOSIS — M5136 Other intervertebral disc degeneration, lumbar region: Secondary | ICD-10-CM | POA: Diagnosis not present

## 2015-03-30 DIAGNOSIS — M5126 Other intervertebral disc displacement, lumbar region: Secondary | ICD-10-CM | POA: Diagnosis not present

## 2015-03-30 DIAGNOSIS — M545 Low back pain: Secondary | ICD-10-CM

## 2015-03-30 MED ORDER — GADOBENATE DIMEGLUMINE 529 MG/ML IV SOLN
13.0000 mL | Freq: Once | INTRAVENOUS | Status: AC | PRN
  Administered 2015-03-30: 13 mL via INTRAVENOUS

## 2015-03-30 NOTE — Telephone Encounter (Signed)
error 

## 2015-04-04 ENCOUNTER — Emergency Department (HOSPITAL_COMMUNITY): Payer: Medicare Other

## 2015-04-04 ENCOUNTER — Inpatient Hospital Stay (HOSPITAL_COMMUNITY)
Admission: EM | Admit: 2015-04-04 | Discharge: 2015-04-07 | DRG: 556 | Disposition: A | Discharge status: Home Health | Payer: Medicare Other | Attending: Internal Medicine | Admitting: Internal Medicine

## 2015-04-04 ENCOUNTER — Encounter (HOSPITAL_COMMUNITY): Payer: Self-pay | Admitting: *Deleted

## 2015-04-04 DIAGNOSIS — G2581 Restless legs syndrome: Secondary | ICD-10-CM | POA: Diagnosis not present

## 2015-04-04 DIAGNOSIS — J449 Chronic obstructive pulmonary disease, unspecified: Secondary | ICD-10-CM | POA: Diagnosis present

## 2015-04-04 DIAGNOSIS — Z881 Allergy status to other antibiotic agents status: Secondary | ICD-10-CM | POA: Diagnosis not present

## 2015-04-04 DIAGNOSIS — M419 Scoliosis, unspecified: Secondary | ICD-10-CM | POA: Diagnosis present

## 2015-04-04 DIAGNOSIS — M791 Myalgia: Secondary | ICD-10-CM | POA: Diagnosis not present

## 2015-04-04 DIAGNOSIS — G894 Chronic pain syndrome: Secondary | ICD-10-CM | POA: Diagnosis present

## 2015-04-04 DIAGNOSIS — S71001A Unspecified open wound, right hip, initial encounter: Secondary | ICD-10-CM | POA: Diagnosis not present

## 2015-04-04 DIAGNOSIS — E785 Hyperlipidemia, unspecified: Secondary | ICD-10-CM | POA: Diagnosis not present

## 2015-04-04 DIAGNOSIS — M25551 Pain in right hip: Secondary | ICD-10-CM | POA: Diagnosis not present

## 2015-04-04 DIAGNOSIS — L0390 Cellulitis, unspecified: Secondary | ICD-10-CM | POA: Diagnosis present

## 2015-04-04 DIAGNOSIS — T79A0XA Compartment syndrome, unspecified, initial encounter: Secondary | ICD-10-CM | POA: Diagnosis present

## 2015-04-04 DIAGNOSIS — Z8249 Family history of ischemic heart disease and other diseases of the circulatory system: Secondary | ICD-10-CM | POA: Diagnosis not present

## 2015-04-04 DIAGNOSIS — F17210 Nicotine dependence, cigarettes, uncomplicated: Secondary | ICD-10-CM | POA: Diagnosis present

## 2015-04-04 DIAGNOSIS — Z96651 Presence of right artificial knee joint: Secondary | ICD-10-CM | POA: Diagnosis not present

## 2015-04-04 DIAGNOSIS — L98499 Non-pressure chronic ulcer of skin of other sites with unspecified severity: Secondary | ICD-10-CM | POA: Diagnosis not present

## 2015-04-04 DIAGNOSIS — L97819 Non-pressure chronic ulcer of other part of right lower leg with unspecified severity: Secondary | ICD-10-CM | POA: Diagnosis present

## 2015-04-04 DIAGNOSIS — M6282 Rhabdomyolysis: Secondary | ICD-10-CM | POA: Diagnosis not present

## 2015-04-04 DIAGNOSIS — Z880 Allergy status to penicillin: Secondary | ICD-10-CM

## 2015-04-04 DIAGNOSIS — M545 Low back pain: Secondary | ICD-10-CM | POA: Diagnosis present

## 2015-04-04 DIAGNOSIS — M609 Myositis, unspecified: Secondary | ICD-10-CM | POA: Diagnosis not present

## 2015-04-04 DIAGNOSIS — E039 Hypothyroidism, unspecified: Secondary | ICD-10-CM | POA: Diagnosis not present

## 2015-04-04 DIAGNOSIS — R0902 Hypoxemia: Secondary | ICD-10-CM | POA: Diagnosis not present

## 2015-04-04 DIAGNOSIS — M79659 Pain in unspecified thigh: Secondary | ICD-10-CM | POA: Diagnosis not present

## 2015-04-04 DIAGNOSIS — M961 Postlaminectomy syndrome, not elsewhere classified: Secondary | ICD-10-CM | POA: Diagnosis present

## 2015-04-04 DIAGNOSIS — F341 Dysthymic disorder: Secondary | ICD-10-CM | POA: Diagnosis present

## 2015-04-04 DIAGNOSIS — Z885 Allergy status to narcotic agent status: Secondary | ICD-10-CM

## 2015-04-04 DIAGNOSIS — N179 Acute kidney failure, unspecified: Secondary | ICD-10-CM | POA: Diagnosis not present

## 2015-04-04 DIAGNOSIS — G4730 Sleep apnea, unspecified: Secondary | ICD-10-CM | POA: Diagnosis present

## 2015-04-04 DIAGNOSIS — R600 Localized edema: Secondary | ICD-10-CM | POA: Diagnosis not present

## 2015-04-04 DIAGNOSIS — M1710 Unilateral primary osteoarthritis, unspecified knee: Secondary | ICD-10-CM | POA: Diagnosis present

## 2015-04-04 DIAGNOSIS — N289 Disorder of kidney and ureter, unspecified: Secondary | ICD-10-CM | POA: Diagnosis not present

## 2015-04-04 DIAGNOSIS — Z882 Allergy status to sulfonamides status: Secondary | ICD-10-CM

## 2015-04-04 DIAGNOSIS — M7918 Myalgia, other site: Secondary | ICD-10-CM

## 2015-04-04 DIAGNOSIS — D649 Anemia, unspecified: Secondary | ICD-10-CM | POA: Diagnosis not present

## 2015-04-04 DIAGNOSIS — X19XXXA Contact with other heat and hot substances, initial encounter: Secondary | ICD-10-CM | POA: Diagnosis present

## 2015-04-04 DIAGNOSIS — M71051 Abscess of bursa, right hip: Secondary | ICD-10-CM

## 2015-04-04 DIAGNOSIS — Z96641 Presence of right artificial hip joint: Secondary | ICD-10-CM | POA: Diagnosis not present

## 2015-04-04 DIAGNOSIS — I10 Essential (primary) hypertension: Secondary | ICD-10-CM | POA: Diagnosis not present

## 2015-04-04 DIAGNOSIS — L0291 Cutaneous abscess, unspecified: Secondary | ICD-10-CM

## 2015-04-04 DIAGNOSIS — K219 Gastro-esophageal reflux disease without esophagitis: Secondary | ICD-10-CM | POA: Diagnosis present

## 2015-04-04 DIAGNOSIS — Z888 Allergy status to other drugs, medicaments and biological substances status: Secondary | ICD-10-CM

## 2015-04-04 HISTORY — DX: Chronic obstructive pulmonary disease, unspecified: J44.9

## 2015-04-04 LAB — CBC WITH DIFFERENTIAL/PLATELET
Basophils Absolute: 0 10*3/uL (ref 0.0–0.1)
Basophils Relative: 0 % (ref 0–1)
Eosinophils Absolute: 0.2 10*3/uL (ref 0.0–0.7)
Eosinophils Relative: 2 % (ref 0–5)
HCT: 26.4 % — ABNORMAL LOW (ref 36.0–46.0)
Hemoglobin: 8.3 g/dL — ABNORMAL LOW (ref 12.0–15.0)
Lymphocytes Relative: 29 % (ref 12–46)
Lymphs Abs: 3 10*3/uL (ref 0.7–4.0)
MCH: 30.5 pg (ref 26.0–34.0)
MCHC: 31.4 g/dL (ref 30.0–36.0)
MCV: 97.1 fL (ref 78.0–100.0)
Monocytes Absolute: 0.7 10*3/uL (ref 0.1–1.0)
Monocytes Relative: 6 % (ref 3–12)
Neutro Abs: 6.6 10*3/uL (ref 1.7–7.7)
Neutrophils Relative %: 63 % (ref 43–77)
Platelets: 282 10*3/uL (ref 150–400)
RBC: 2.72 MIL/uL — ABNORMAL LOW (ref 3.87–5.11)
RDW: 13.3 % (ref 11.5–15.5)
WBC: 10.5 10*3/uL (ref 4.0–10.5)

## 2015-04-04 LAB — COMPREHENSIVE METABOLIC PANEL
ALT: 52 U/L (ref 14–54)
AST: 145 U/L — ABNORMAL HIGH (ref 15–41)
Albumin: 4.1 g/dL (ref 3.5–5.0)
Alkaline Phosphatase: 134 U/L — ABNORMAL HIGH (ref 38–126)
Anion gap: 7 (ref 5–15)
BUN: 22 mg/dL — ABNORMAL HIGH (ref 6–20)
CO2: 27 mmol/L (ref 22–32)
Calcium: 8.6 mg/dL — ABNORMAL LOW (ref 8.9–10.3)
Chloride: 108 mmol/L (ref 101–111)
Creatinine, Ser: 1.22 mg/dL — ABNORMAL HIGH (ref 0.44–1.00)
GFR calc Af Amer: 55 mL/min — ABNORMAL LOW (ref >60)
GFR calc non Af Amer: 47 mL/min — ABNORMAL LOW (ref >60)
Glucose, Bld: 92 mg/dL (ref 65–99)
Potassium: 4.1 mmol/L (ref 3.5–5.1)
Sodium: 142 mmol/L (ref 135–145)
Total Bilirubin: 0.3 mg/dL (ref 0.3–1.2)
Total Protein: 7.6 g/dL (ref 6.5–8.1)

## 2015-04-04 MED ORDER — SODIUM CHLORIDE 0.9 % IV BOLUS (SEPSIS)
1000.0000 mL | Freq: Once | INTRAVENOUS | Status: AC
  Administered 2015-04-04: 1000 mL via INTRAVENOUS

## 2015-04-04 MED ORDER — GADOBENATE DIMEGLUMINE 529 MG/ML IV SOLN
15.0000 mL | Freq: Once | INTRAVENOUS | Status: AC | PRN
  Administered 2015-04-04: 13 mL via INTRAVENOUS

## 2015-04-04 MED ORDER — HYDROMORPHONE HCL 1 MG/ML IJ SOLN
1.0000 mg | Freq: Once | INTRAMUSCULAR | Status: AC
  Administered 2015-04-04: 1 mg via INTRAVENOUS
  Filled 2015-04-04: qty 1

## 2015-04-04 NOTE — ED Notes (Signed)
Pt arrives to the ER via EMS for complaints of a wound infection to rt hip area; pt states that she is unsure how the wound happened; pt has a 50 cent piece sized area to rt hip / thigh area that resembles a burn and has redness around it; pt denies cigarette burn states that it may have been a heating pad; pt also c/o generalized achiness; pt states that the pain is worse when she attempts to ambulates

## 2015-04-04 NOTE — ED Notes (Signed)
Patient transported to MRI 

## 2015-04-04 NOTE — ED Notes (Addendum)
Pt c/o 7/10 pain described as aching and throbbing related to open wound on right hip.  The wound is approx 2.5 inches long and about an inch tall and appears to be oozing a small amount of exudate. Pt describes the wound as "so extremely tender" and is worried that it may be related in some way to her hip replacement five years ago.  Family is at the bedside.

## 2015-04-05 ENCOUNTER — Inpatient Hospital Stay (HOSPITAL_COMMUNITY): Payer: Medicare Other

## 2015-04-05 ENCOUNTER — Encounter (HOSPITAL_COMMUNITY): Payer: Self-pay | Admitting: Internal Medicine

## 2015-04-05 DIAGNOSIS — Z882 Allergy status to sulfonamides status: Secondary | ICD-10-CM | POA: Diagnosis not present

## 2015-04-05 DIAGNOSIS — M791 Myalgia: Secondary | ICD-10-CM | POA: Diagnosis not present

## 2015-04-05 DIAGNOSIS — Z96651 Presence of right artificial knee joint: Secondary | ICD-10-CM | POA: Diagnosis present

## 2015-04-05 DIAGNOSIS — Z888 Allergy status to other drugs, medicaments and biological substances status: Secondary | ICD-10-CM | POA: Diagnosis not present

## 2015-04-05 DIAGNOSIS — D649 Anemia, unspecified: Secondary | ICD-10-CM

## 2015-04-05 DIAGNOSIS — G894 Chronic pain syndrome: Secondary | ICD-10-CM | POA: Diagnosis present

## 2015-04-05 DIAGNOSIS — L97819 Non-pressure chronic ulcer of other part of right lower leg with unspecified severity: Secondary | ICD-10-CM | POA: Diagnosis present

## 2015-04-05 DIAGNOSIS — F341 Dysthymic disorder: Secondary | ICD-10-CM | POA: Diagnosis present

## 2015-04-05 DIAGNOSIS — E039 Hypothyroidism, unspecified: Secondary | ICD-10-CM | POA: Diagnosis present

## 2015-04-05 DIAGNOSIS — L98499 Non-pressure chronic ulcer of skin of other sites with unspecified severity: Secondary | ICD-10-CM | POA: Diagnosis present

## 2015-04-05 DIAGNOSIS — F17210 Nicotine dependence, cigarettes, uncomplicated: Secondary | ICD-10-CM | POA: Diagnosis present

## 2015-04-05 DIAGNOSIS — M609 Myositis, unspecified: Secondary | ICD-10-CM | POA: Diagnosis not present

## 2015-04-05 DIAGNOSIS — M961 Postlaminectomy syndrome, not elsewhere classified: Secondary | ICD-10-CM | POA: Diagnosis present

## 2015-04-05 DIAGNOSIS — E785 Hyperlipidemia, unspecified: Secondary | ICD-10-CM | POA: Diagnosis present

## 2015-04-05 DIAGNOSIS — K219 Gastro-esophageal reflux disease without esophagitis: Secondary | ICD-10-CM | POA: Diagnosis present

## 2015-04-05 DIAGNOSIS — L03115 Cellulitis of right lower limb: Secondary | ICD-10-CM | POA: Diagnosis not present

## 2015-04-05 DIAGNOSIS — M7918 Myalgia, other site: Secondary | ICD-10-CM

## 2015-04-05 DIAGNOSIS — T79A0XA Compartment syndrome, unspecified, initial encounter: Secondary | ICD-10-CM | POA: Diagnosis present

## 2015-04-05 DIAGNOSIS — J449 Chronic obstructive pulmonary disease, unspecified: Secondary | ICD-10-CM | POA: Diagnosis present

## 2015-04-05 DIAGNOSIS — Z885 Allergy status to narcotic agent status: Secondary | ICD-10-CM | POA: Diagnosis not present

## 2015-04-05 DIAGNOSIS — M1710 Unilateral primary osteoarthritis, unspecified knee: Secondary | ICD-10-CM | POA: Diagnosis present

## 2015-04-05 DIAGNOSIS — M419 Scoliosis, unspecified: Secondary | ICD-10-CM | POA: Diagnosis present

## 2015-04-05 DIAGNOSIS — G4730 Sleep apnea, unspecified: Secondary | ICD-10-CM | POA: Diagnosis present

## 2015-04-05 DIAGNOSIS — X19XXXA Contact with other heat and hot substances, initial encounter: Secondary | ICD-10-CM | POA: Diagnosis present

## 2015-04-05 DIAGNOSIS — I10 Essential (primary) hypertension: Secondary | ICD-10-CM | POA: Diagnosis present

## 2015-04-05 DIAGNOSIS — N289 Disorder of kidney and ureter, unspecified: Secondary | ICD-10-CM | POA: Diagnosis not present

## 2015-04-05 DIAGNOSIS — M6282 Rhabdomyolysis: Secondary | ICD-10-CM | POA: Diagnosis present

## 2015-04-05 DIAGNOSIS — T24211A Burn of second degree of right thigh, initial encounter: Secondary | ICD-10-CM | POA: Diagnosis not present

## 2015-04-05 DIAGNOSIS — M545 Low back pain: Secondary | ICD-10-CM | POA: Diagnosis present

## 2015-04-05 DIAGNOSIS — M60851 Other myositis, right thigh: Secondary | ICD-10-CM | POA: Diagnosis not present

## 2015-04-05 DIAGNOSIS — Z880 Allergy status to penicillin: Secondary | ICD-10-CM | POA: Diagnosis not present

## 2015-04-05 DIAGNOSIS — M25551 Pain in right hip: Secondary | ICD-10-CM | POA: Diagnosis present

## 2015-04-05 DIAGNOSIS — G2581 Restless legs syndrome: Secondary | ICD-10-CM | POA: Diagnosis present

## 2015-04-05 DIAGNOSIS — N179 Acute kidney failure, unspecified: Secondary | ICD-10-CM | POA: Diagnosis present

## 2015-04-05 DIAGNOSIS — Z881 Allergy status to other antibiotic agents status: Secondary | ICD-10-CM | POA: Diagnosis not present

## 2015-04-05 DIAGNOSIS — Z8249 Family history of ischemic heart disease and other diseases of the circulatory system: Secondary | ICD-10-CM | POA: Diagnosis not present

## 2015-04-05 DIAGNOSIS — Z96641 Presence of right artificial hip joint: Secondary | ICD-10-CM | POA: Diagnosis not present

## 2015-04-05 DIAGNOSIS — L0390 Cellulitis, unspecified: Secondary | ICD-10-CM | POA: Diagnosis not present

## 2015-04-05 DIAGNOSIS — R600 Localized edema: Secondary | ICD-10-CM | POA: Diagnosis not present

## 2015-04-05 LAB — COMPREHENSIVE METABOLIC PANEL
ALT: 46 U/L (ref 14–54)
AST: 126 U/L — ABNORMAL HIGH (ref 15–41)
Albumin: 3.6 g/dL (ref 3.5–5.0)
Alkaline Phosphatase: 120 U/L (ref 38–126)
Anion gap: 6 (ref 5–15)
BUN: 20 mg/dL (ref 6–20)
CO2: 28 mmol/L (ref 22–32)
Calcium: 8.3 mg/dL — ABNORMAL LOW (ref 8.9–10.3)
Chloride: 106 mmol/L (ref 101–111)
Creatinine, Ser: 1.09 mg/dL — ABNORMAL HIGH (ref 0.44–1.00)
GFR calc Af Amer: >60 mL/min (ref >60)
GFR calc non Af Amer: 54 mL/min — ABNORMAL LOW (ref >60)
Glucose, Bld: 89 mg/dL (ref 65–99)
Potassium: 3.8 mmol/L (ref 3.5–5.1)
Sodium: 140 mmol/L (ref 135–145)
Total Bilirubin: 0.6 mg/dL (ref 0.3–1.2)
Total Protein: 6.8 g/dL (ref 6.5–8.1)

## 2015-04-05 LAB — CBC
HCT: 35.5 % — ABNORMAL LOW (ref 36.0–46.0)
Hemoglobin: 11.2 g/dL — ABNORMAL LOW (ref 12.0–15.0)
MCH: 31.4 pg (ref 26.0–34.0)
MCHC: 31.5 g/dL (ref 30.0–36.0)
MCV: 99.4 fL (ref 78.0–100.0)
Platelets: 231 10*3/uL (ref 150–400)
RBC: 3.57 MIL/uL — ABNORMAL LOW (ref 3.87–5.11)
RDW: 13.4 % (ref 11.5–15.5)
WBC: 7.5 10*3/uL (ref 4.0–10.5)

## 2015-04-05 LAB — RAPID URINE DRUG SCREEN, HOSP PERFORMED
Amphetamines: NOT DETECTED
Barbiturates: NOT DETECTED
Benzodiazepines: POSITIVE — AB
Cocaine: NOT DETECTED
Opiates: POSITIVE — AB
Tetrahydrocannabinol: NOT DETECTED

## 2015-04-05 LAB — PROCALCITONIN: Procalcitonin: <0.1 ng/mL

## 2015-04-05 LAB — SEDIMENTATION RATE: Sed Rate: 33 mm/hr — ABNORMAL HIGH (ref 0–22)

## 2015-04-05 LAB — CK: Total CK: 8820 U/L — ABNORMAL HIGH (ref 38–234)

## 2015-04-05 LAB — FERRITIN: Ferritin: 38 ng/mL (ref 11–307)

## 2015-04-05 LAB — VITAMIN B12: Vitamin B-12: 244 pg/mL (ref 180–914)

## 2015-04-05 LAB — C-REACTIVE PROTEIN: CRP: 3.7 mg/dL — ABNORMAL HIGH (ref <1.0)

## 2015-04-05 MED ORDER — QUETIAPINE FUMARATE 50 MG PO TABS
50.0000 mg | ORAL_TABLET | Freq: Every evening | ORAL | Status: DC | PRN
  Administered 2015-04-05 – 2015-04-06 (×2): 100 mg via ORAL
  Filled 2015-04-05 (×3): qty 2

## 2015-04-05 MED ORDER — ENOXAPARIN SODIUM 40 MG/0.4ML ~~LOC~~ SOLN
40.0000 mg | Freq: Every day | SUBCUTANEOUS | Status: DC
  Administered 2015-04-05 – 2015-04-07 (×3): 40 mg via SUBCUTANEOUS
  Filled 2015-04-05 (×3): qty 0.4

## 2015-04-05 MED ORDER — BENAZEPRIL HCL 40 MG PO TABS
40.0000 mg | ORAL_TABLET | Freq: Every day | ORAL | Status: DC
  Administered 2015-04-05 – 2015-04-07 (×2): 40 mg via ORAL
  Filled 2015-04-05 (×3): qty 1

## 2015-04-05 MED ORDER — ALBUTEROL SULFATE (2.5 MG/3ML) 0.083% IN NEBU: 3.0000 mL | INHALATION_SOLUTION | Freq: Four times a day (QID) | RESPIRATORY_TRACT | Status: DC | PRN

## 2015-04-05 MED ORDER — SODIUM CHLORIDE 0.9 % IV SOLN
500.0000 mg | Freq: Two times a day (BID) | INTRAVENOUS | Status: DC
  Administered 2015-04-05 (×2): 500 mg via INTRAVENOUS
  Filled 2015-04-05 (×3): qty 500

## 2015-04-05 MED ORDER — CYANOCOBALAMIN 1000 MCG/ML IJ SOLN
1000.0000 ug | Freq: Once | INTRAMUSCULAR | Status: AC
Start: 2015-04-05 — End: 2015-04-05
  Administered 2015-04-05: 1000 ug via INTRAMUSCULAR
  Filled 2015-04-05 (×2): qty 1

## 2015-04-05 MED ORDER — VENLAFAXINE HCL ER 150 MG PO CP24
150.0000 mg | ORAL_CAPSULE | Freq: Every day | ORAL | Status: DC
  Administered 2015-04-05 – 2015-04-07 (×3): 150 mg via ORAL
  Filled 2015-04-05 (×3): qty 1

## 2015-04-05 MED ORDER — GABAPENTIN 300 MG PO CAPS
300.0000 mg | ORAL_CAPSULE | Freq: Every day | ORAL | Status: DC
  Administered 2015-04-05 – 2015-04-06 (×2): 300 mg via ORAL
  Filled 2015-04-05 (×3): qty 1

## 2015-04-05 MED ORDER — PROMETHAZINE HCL 25 MG PO TABS: 25.0000 mg | ORAL_TABLET | Freq: Four times a day (QID) | ORAL | Status: DC | PRN

## 2015-04-05 MED ORDER — SODIUM CHLORIDE 0.9 % IJ SOLN
3.0000 mL | Freq: Two times a day (BID) | INTRAMUSCULAR | Status: DC
  Administered 2015-04-05 – 2015-04-06 (×2): 3 mL via INTRAVENOUS

## 2015-04-05 MED ORDER — BACITRACIN ZINC 500 UNIT/GM EX OINT
TOPICAL_OINTMENT | Freq: Two times a day (BID) | CUTANEOUS | Status: DC
Start: 2015-04-05 — End: 2015-04-07
  Administered 2015-04-06 – 2015-04-07 (×3): via TOPICAL
  Filled 2015-04-05: qty 28.35

## 2015-04-05 MED ORDER — FLUTICASONE PROPIONATE 50 MCG/ACT NA SUSP: 2.0000 | Freq: Every day | NASAL | Status: DC | PRN

## 2015-04-05 MED ORDER — ONDANSETRON HCL 4 MG/2ML IJ SOLN
4.0000 mg | Freq: Four times a day (QID) | INTRAMUSCULAR | Status: DC | PRN
Start: 2015-04-05 — End: 2015-04-07

## 2015-04-05 MED ORDER — FENTANYL 50 MCG/HR TD PT72
50.0000 ug | MEDICATED_PATCH | TRANSDERMAL | Status: DC
  Administered 2015-04-06: 50 ug via TRANSDERMAL
  Filled 2015-04-05: qty 1

## 2015-04-05 MED ORDER — AMLODIPINE BESYLATE 10 MG PO TABS
10.0000 mg | ORAL_TABLET | Freq: Every day | ORAL | Status: DC
  Administered 2015-04-05 – 2015-04-07 (×3): 10 mg via ORAL
  Filled 2015-04-05 (×3): qty 1

## 2015-04-05 MED ORDER — CLONAZEPAM 1 MG PO TABS
2.0000 mg | ORAL_TABLET | Freq: Two times a day (BID) | ORAL | Status: DC | PRN
  Administered 2015-04-06: 2 mg via ORAL
  Filled 2015-04-05: qty 2

## 2015-04-05 MED ORDER — TIOTROPIUM BROMIDE MONOHYDRATE 18 MCG IN CAPS
18.0000 ug | ORAL_CAPSULE | Freq: Every day | RESPIRATORY_TRACT | Status: DC
  Administered 2015-04-05 – 2015-04-07 (×3): 18 ug via RESPIRATORY_TRACT
  Filled 2015-04-05: qty 5

## 2015-04-05 MED ORDER — HYDROCODONE-ACETAMINOPHEN 10-325 MG PO TABS
1.0000 | ORAL_TABLET | Freq: Three times a day (TID) | ORAL | Status: DC | PRN
  Administered 2015-04-05 – 2015-04-06 (×4): 1 via ORAL
  Filled 2015-04-05 (×4): qty 1

## 2015-04-05 MED ORDER — PRAMIPEXOLE DIHYDROCHLORIDE 0.125 MG PO TABS
0.1250 mg | ORAL_TABLET | Freq: Every day | ORAL | Status: DC
  Administered 2015-04-05 – 2015-04-06 (×2): 0.125 mg via ORAL
  Filled 2015-04-05 (×3): qty 1

## 2015-04-05 MED ORDER — LEVOTHYROXINE SODIUM 50 MCG PO TABS
50.0000 ug | ORAL_TABLET | Freq: Every day | ORAL | Status: DC
  Administered 2015-04-05 – 2015-04-07 (×3): 50 ug via ORAL
  Filled 2015-04-05 (×4): qty 1

## 2015-04-05 MED ORDER — LEVOFLOXACIN IN D5W 500 MG/100ML IV SOLN
500.0000 mg | INTRAVENOUS | Status: DC
  Administered 2015-04-05 – 2015-04-07 (×3): 500 mg via INTRAVENOUS
  Filled 2015-04-05 (×3): qty 100

## 2015-04-05 MED ORDER — IOHEXOL 300 MG/ML  SOLN
100.0000 mL | Freq: Once | INTRAMUSCULAR | Status: AC | PRN
  Administered 2015-04-05: 100 mL via INTRAVENOUS

## 2015-04-05 MED ORDER — MUPIROCIN 2 % EX OINT
TOPICAL_OINTMENT | Freq: Two times a day (BID) | CUTANEOUS | Status: DC
  Administered 2015-04-05 – 2015-04-06 (×3): via TOPICAL
  Filled 2015-04-05: qty 22

## 2015-04-05 MED ORDER — CARISOPRODOL 350 MG PO TABS
350.0000 mg | ORAL_TABLET | Freq: Three times a day (TID) | ORAL | Status: DC | PRN
  Administered 2015-04-05: 350 mg via ORAL
  Filled 2015-04-05: qty 1

## 2015-04-05 MED ORDER — ACETAMINOPHEN 650 MG RE SUPP: 650.0000 mg | Freq: Four times a day (QID) | RECTAL | Status: DC | PRN

## 2015-04-05 MED ORDER — HYDROMORPHONE HCL 1 MG/ML IJ SOLN
1.0000 mg | INTRAMUSCULAR | Status: DC | PRN
  Administered 2015-04-05: 1 mg via INTRAVENOUS
  Filled 2015-04-05: qty 1

## 2015-04-05 MED ORDER — ACETAMINOPHEN 325 MG PO TABS
650.0000 mg | ORAL_TABLET | Freq: Four times a day (QID) | ORAL | Status: DC | PRN
  Administered 2015-04-05: 650 mg via ORAL
  Filled 2015-04-05: qty 2

## 2015-04-05 MED ORDER — VALACYCLOVIR HCL 500 MG PO TABS
500.0000 mg | ORAL_TABLET | Freq: Every day | ORAL | Status: DC
  Administered 2015-04-05 – 2015-04-07 (×3): 500 mg via ORAL
  Filled 2015-04-05 (×3): qty 1

## 2015-04-05 MED ORDER — NAPROXEN 500 MG PO TABS
500.0000 mg | ORAL_TABLET | Freq: Every evening | ORAL | Status: DC | PRN
  Filled 2015-04-05: qty 1

## 2015-04-05 MED ORDER — PANTOPRAZOLE SODIUM 40 MG PO TBEC
40.0000 mg | DELAYED_RELEASE_TABLET | Freq: Every day | ORAL | Status: DC
  Administered 2015-04-05 – 2015-04-07 (×3): 40 mg via ORAL
  Filled 2015-04-05 (×4): qty 1

## 2015-04-05 MED ORDER — PRAVASTATIN SODIUM 40 MG PO TABS
40.0000 mg | ORAL_TABLET | Freq: Every day | ORAL | Status: DC
  Administered 2015-04-05 – 2015-04-06 (×2): 40 mg via ORAL
  Filled 2015-04-05 (×3): qty 1

## 2015-04-05 MED ORDER — NICOTINE 14 MG/24HR TD PT24
14.0000 mg | MEDICATED_PATCH | Freq: Every day | TRANSDERMAL | Status: DC
  Administered 2015-04-05 – 2015-04-06 (×2): 14 mg via TRANSDERMAL
  Filled 2015-04-05 (×3): qty 1

## 2015-04-05 MED ORDER — HYDROCHLOROTHIAZIDE 12.5 MG PO CAPS
12.5000 mg | ORAL_CAPSULE | Freq: Every day | ORAL | Status: DC
  Administered 2015-04-06 – 2015-04-07 (×2): 12.5 mg via ORAL
  Filled 2015-04-05 (×3): qty 1

## 2015-04-05 MED ORDER — SODIUM CHLORIDE 0.9 % IV SOLN
INTRAVENOUS | Status: DC
  Administered 2015-04-05 (×3): via INTRAVENOUS

## 2015-04-05 MED ORDER — SODIUM CHLORIDE 0.9 % IV BOLUS (SEPSIS)
1000.0000 mL | Freq: Once | INTRAVENOUS | Status: AC
  Administered 2015-04-05: 1000 mL via INTRAVENOUS

## 2015-04-05 MED ORDER — DIPHENHYDRAMINE HCL 50 MG/ML IJ SOLN: 25.0000 mg | Freq: Four times a day (QID) | INTRAMUSCULAR | Status: DC | PRN

## 2015-04-05 MED ORDER — VANCOMYCIN HCL IN DEXTROSE 1-5 GM/200ML-% IV SOLN
1000.0000 mg | Freq: Once | INTRAVENOUS | Status: AC
  Administered 2015-04-05: 1000 mg via INTRAVENOUS
  Filled 2015-04-05: qty 200

## 2015-04-05 MED ORDER — HYDROMORPHONE HCL 1 MG/ML IJ SOLN
1.0000 mg | Freq: Once | INTRAMUSCULAR | Status: AC
  Administered 2015-04-05: 1 mg via INTRAVENOUS
  Filled 2015-04-05: qty 1

## 2015-04-05 NOTE — Progress Notes (Signed)
Patient admitted to [facility] at 45.

## 2015-04-05 NOTE — Progress Notes (Signed)
ANTIBIOTIC CONSULT NOTE - INITIAL  Pharmacy Consult for Vancomycin Indication: cellulitis/abscess  Allergies  Allergen Reactions  . Amoxicillin Other (See Comments)    REACTION: Oral yeast infection  . Chlorzoxazone Other (See Comments)    REACTION: headache  . Codeine Other (See Comments)    REACTION: headache  . Darvocet [Propoxyphene N-Acetaminophen] Itching  . Dilaudid [Hydromorphone Hcl] Itching  . Flagyl [Metronidazole] Diarrhea  . Keflex [Cephalexin] Other (See Comments)    REACTION: unknown  . Morphine And Related Itching  . Nitrofurantoin Monohyd Macro Hives  . Percocet [Oxycodone-Acetaminophen] Itching  . Sulfa Antibiotics Other (See Comments)    REACTION: Yeast infection in mouth    Patient Measurements: Weight: 146 lb (66.225 kg)   Vital Signs: Temp: 98.9 F (37.2 C) (08/02 2037) Temp Source: Oral (08/02 2037) BP: 119/72 mmHg (08/02 2037) Pulse Rate: 94 (08/02 2037) Intake/Output from previous day:   Intake/Output from this shift:    Labs:  Recent Labs  04/04/15 2223  WBC 10.5  HGB 8.3*  PLT 282  CREATININE 1.22*   Estimated Creatinine Clearance: 45.9 mL/min (by C-G formula based on Cr of 1.22). No results for input(s): VANCOTROUGH, VANCOPEAK, VANCORANDOM, GENTTROUGH, GENTPEAK, GENTRANDOM, TOBRATROUGH, TOBRAPEAK, TOBRARND, AMIKACINPEAK, AMIKACINTROU, AMIKACIN in the last 72 hours.   Microbiology: No results found for this or any previous visit (from the past 720 hour(s)).  Medical History: Past Medical History  Diagnosis Date  . Postlaminectomy syndrome, thoracic region   . Osteoarthrosis, unspecified whether generalized or localized, lower leg   . Dysthymic disorder   . Calcifying tendinitis of shoulder   . Pain in joint, upper arm   . Chronic pain syndrome   . Lumbago   . Primary localized osteoarthrosis, lower leg   . Hypertension   . Hyperlipidemia   . GERD (gastroesophageal reflux disease)   . Thyroid disease   . Restless leg  syndrome   . PONV (postoperative nausea and vomiting)   . Heart murmur   . Sleep apnea     s/p surgery- last sleep study 2011- doesnt use oxygen or machine at night as instructed  . History of blood transfusion 1980  . Hypothyroidism   . Active smoker   . COPD (chronic obstructive pulmonary disease)     on nocturnal home o2    Medications:   (Not in a hospital admission) Scheduled:   Infusions:  . vancomycin    . vancomycin     Assessment: 80 yoF c/o aching/throbbing of open wound on right hip.  Vancomycin per Rx for cellulitis/abscess.   Goal of Therapy:  Vancomycin trough level 15-20 mcg/ml  Plan:   Vancomycin 1Gm x1 then 500mg  IV q12h  F/u scr/cultures/levels as needed  Dorrene German 04/05/2015,12:44 AM

## 2015-04-05 NOTE — ED Notes (Signed)
Patient transported to CT 

## 2015-04-05 NOTE — ED Notes (Signed)
Paged admitting doctor x 2 for clarification of orders, no response. Dr. Hal Hope called and confirmed that cardiac monitoring is NOT REQUIRED.  Will notify floor nurse.

## 2015-04-05 NOTE — ED Notes (Signed)
Attempted to call report; nurse on floor is away from the unit at the moment and will call back.

## 2015-04-05 NOTE — Consult Note (Signed)
Reason for Consult:Right Hip ulcer, underlying muscle changes on MRI Referring Physician: Jonell Cluck, MD Consulting Physician:NITKA,JAMES E  Orthopedic Diagnosis:1)Right lateral trochanter superficial wound, abrasion vs open bullae. 2) Right Gluteus medius and minimus MRI muscle changes consistent with local inflamation or infection or compartment syndrome. 3) Renal insufficency, mild Cr. Normal. 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  PJA:SNKNL Jill Phillips is an 59 y.o. female. Jill Phillips has been seen in my office in the past with complaints of chronic low back pain and chronic lower extremity complaints. She has had a history of right knee severe osteoarthritis underwent a right total knee arthroplasty almost 3 years ago. She had a fall almost 2 years ago sustaining a right hip fracture and underwent a right hip bipolar hemiarthroplasty procedure uncemented. She has had multiple lumbar surgeries for problems of chronic degenerative disc disease which is painful and this led to increase use of narcotic medicines and chronic disability. She is seen by Dr. Tessa Lerner in pain management at [facility]. Seen recently in our office with increasing size of the localized right sided to the tilt of the spine for scoliosis apex to the left side due to segmental collapse of degenerative disks between a fusion at the T11-T12 level and a lumbar fusion that extends from L3 to the sacrum. Discussion regarding consideration of extending her fusion between these 2 segments in order to relieve a worsening back pain and radiation into her legs that is limiting her standing tolerance her walking tolerance her ability to bend or stooped.  This patient states that she was in her usual state of painful health when she began experiencing discomfort and began using a heating pad on Saturday over her back and right side. She relates that she stayed on the heating pad for over 4 hours and on this right side at at  bedrest. She noticed a blister then on Saturday and early this week that had developed. Then noticed yesterday some red streaks that are occurring across her right side by doc and lower abdomen. She has not had any fever or chills. Her daughter assisted her and examining the area. Blister over the right lateral trochanter area went on to open yesterday and she presented to the emergency room at [facility]. There she had examination and was admitted for evaluation of a right-sided open wound over the lateral aspect of the hip and concerns about underlying infection. Received a call this morning to evaluate the patient as she has a right-sided hemiarthroplasty and right sided total knee replacement as she is at risk of developing a seeding of nearby total joint replacements. She denies any fever or chills today. She does have discomfort in her back and her by doc and into the right lower extremity. MRI scan has been done which shows a right side with areas of edema versus inflammation versus infection changes associated with the right gluteus medius gluteus maximus right gluteus minimus. Some very small areas of fluid collection. She is taking and tolerating oral diet fine this morning.  She has had no recent injections to the right hip by doc or greater trochanter area. Denies any recent open wounds or trauma to the right and thigh. He does take narcotic medicines on a chronic basis and does take these during the daytime. Denies any loss of consciousness or prolonged period of loss of consciousness.   Past Medical History  Diagnosis Date  . Postlaminectomy syndrome, thoracic region   . Osteoarthrosis, unspecified  whether generalized or localized, lower leg   . Dysthymic disorder   . Calcifying tendinitis of shoulder   . Pain in joint, upper arm   . Chronic pain syndrome   . Lumbago   . Primary localized osteoarthrosis, lower leg   . Hypertension   . Hyperlipidemia   . GERD (gastroesophageal  reflux disease)   . Thyroid disease   . Restless leg syndrome   . PONV (postoperative nausea and vomiting)   . Heart murmur   . Sleep apnea     s/p surgery- last sleep study 2011- doesnt use oxygen or machine at night as instructed  . History of blood transfusion 1980  . Hypothyroidism   . Active smoker   . COPD (chronic obstructive pulmonary disease)     on nocturnal home o2    Past Surgical History  Procedure Laterality Date  . Appendectomy    . Abdominal hysterectomy    . Tubal ligation    . Spine surgery      thoracic x 1,  lumbar x 15  . Right hip replacement    . Knee surgeries r knee      arthroscopy- right  . Hammer toe surgery    . Sleep apnea surgery    . Cholecystectomy    . Joint replacement    . Total knee arthroplasty  05/29/2012    Procedure: TOTAL KNEE ARTHROPLASTY;  Surgeon: Mcarthur Rossetti, MD;  Location: [facility];  Service: Orthopedics;  Laterality: Right;  Right Total Knee Arthroplasty  . Back surgery      16 back surgeries  . Lumbar laminectomy/decompression microdiscectomy N/A 01/28/2014    Procedure: Minimally Invasive Right  L1-2 Microdiscectomy;  Surgeon: [name], MD;  Location: [place];  Service: Orthopedics;  Laterality: N/A;    Family History  Problem Relation Age of Onset  . Kidney disease Mother   . Heart disease Father   . Anuerysm Brother 29    brain  . Heart disease Brother   . Heart disease Sister 56    s/p CABG  . Hypertension Sister     Social History:  reports that she has been smoking Cigarettes.  She has a 67.5 pack-year smoking history. She has never used smokeless tobacco. She reports that she does not drink alcohol or use illicit drugs.  Allergies:  Allergies  Allergen Reactions  . Amoxicillin Other (See Comments)    REACTION: Oral yeast infection  . Chlorzoxazone Other (See Comments)    REACTION: headache  . Codeine Other (See Comments)    REACTION: headache  . Darvocet [Propoxyphene N-Acetaminophen] Itching    . Dilaudid [Hydromorphone Hcl] Itching  . Flagyl [Metronidazole] Diarrhea  . Keflex [Cephalexin] Other (See Comments)    REACTION: unknown  . Morphine And Related Itching  . Nitrofurantoin Monohyd Macro Hives  . Percocet [Oxycodone-Acetaminophen] Itching  . Sulfa Antibiotics Other (See Comments)    REACTION: Yeast infection in mouth    Medications:  Prior to Admission:  Prescriptions prior to admission  Medication Sig Dispense Refill Last Dose  . acetaminophen (TYLENOL) 500 MG tablet Take 1,000 mg by mouth every 6 (six) hours as needed for moderate pain.    Past Week at Unknown time  . albuterol (PROVENTIL HFA;VENTOLIN HFA) 108 (90 BASE) MCG/ACT inhaler Inhale 1 puff into the lungs every 6 (six) hours as needed for wheezing or shortness of breath. 1 Inhaler 5 Past Week at Unknown time  . ALPRAZolam (XANAX) 1 MG tablet Take 1  tablet by mouth daily as needed for anxiety.    Past Week at Unknown time  . amLODipine (NORVASC) 10 MG tablet TAKE 1 TABLET (10 MG TOTAL) BY MOUTH DAILY. 30 tablet 11 04/03/2015 at Unknown time  . benazepril (LOTENSIN) 40 MG tablet TAKE 1 TABLET BY MOUTH DAILY. 30 tablet 11 04/03/2015 at Unknown time  . carisoprodol (SOMA) 350 MG tablet TAKE 1 TABLET BY MOUTH EVERY 8 HOURS AS NEEDED. 90 tablet 3 04/03/2015 at Unknown time  . clonazePAM (KLONOPIN) 2 MG tablet TAKE 1 TABLET BY MOUTH TWICE DAILY AS NEEDED 60 tablet 2 04/03/2015 at Unknown time  . fentaNYL (DURAGESIC) 50 MCG/HR Place 1 patch (50 mcg total) onto the skin every 3 (three) days. 10 patch 0 04/03/2015 at Unknown time  . fluticasone (FLONASE) 50 MCG/ACT nasal spray Place 2 sprays into both nostrils daily. (Patient taking differently: Place 2 sprays into both nostrils daily as needed for allergies. ) 16 g 2 Past Week at Unknown time  . gabapentin (NEURONTIN) 300 MG capsule Take 1 capsule (300 mg total) by mouth at bedtime. 30 capsule 3 04/03/2015 at Unknown time  . hydrochlorothiazide (HYDRODIURIL) 12.5 MG tablet Take 1  tablet by mouth daily.   04/03/2015 at Unknown time  . HYDROcodone-acetaminophen (NORCO) 10-325 MG per tablet Take 1 tablet by mouth every 8 (eight) hours as needed for severe pain. 90 tablet 0 04/03/2015 at Unknown time  . levothyroxine (SYNTHROID, LEVOTHROID) 50 MCG tablet Take 1 tablet (50 mcg total) by mouth daily before breakfast. 30 tablet 11 04/04/2015 at Unknown time  . naproxen (NAPROSYN) 500 MG tablet Take 1 tablet (500 mg total) by mouth at bedtime as needed for moderate pain. 30 tablet 5 04/04/2015 at Unknown time  . nicotine (NICODERM CQ - DOSED IN MG/24 HOURS) 14 mg/24hr patch Place 1 patch (14 mg total) onto the skin daily. 28 patch 11 Past Month at Unknown time  . omeprazole (PRILOSEC) 20 MG capsule TAKE 1 CAPSULE BY MOUTH 2 TIMES DAILY BEFORE A MEAL. 60 capsule 11 04/03/2015 at Unknown time  . pramipexole (MIRAPEX) 0.125 MG tablet 0.125 mg q hst for three days, then bid thereafter. (Patient taking differently: Take 0.125 mg by mouth at bedtime. ) 60 tablet 2 04/03/2015 at Unknown time  . pravastatin (PRAVACHOL) 40 MG tablet TAKE 1 TABLET BY MOUTH DAILY. 30 tablet 11 04/03/2015 at Unknown time  . promethazine (PHENERGAN) 25 MG tablet Take 1 tablet by mouth every 6 (six) hours as needed for nausea or vomiting.    over 1 month at Unknown time  . Psyllium (NATURAL FIBER LAXATIVE PO) Take 2 tablets by mouth daily as needed (constipation).   04/03/2015 at Unknown time  . QUEtiapine (SEROQUEL) 50 MG tablet Take 50-100 mg by mouth at bedtime as needed (sleep).    04/03/2015 at Unknown time  . tiotropium (SPIRIVA) 18 MCG inhalation capsule Place 1 capsule (18 mcg total) into inhaler and inhale daily. 30 capsule 6 04/03/2015 at Unknown time  . valACYclovir (VALTREX) 500 MG tablet TAKE 1 TABLET BY MOUTH DAILY. 30 tablet 11 04/03/2015 at Unknown time  . venlafaxine XR (EFFEXOR-XR) 150 MG 24 hr capsule Take 150 mg by mouth daily.   04/03/2015 at Unknown time  . VOLTAREN 1 % GEL APPLY 2 GRAMS TO AFFECTED AREA 3 TIMES DAILY  AS NEEDED FOR PAIN. 300 g 2 Past Week at Unknown time  . ciprofloxacin (CIPRO) 250 MG tablet Take 1 tablet (250 mg total) by mouth 2 (two) times  daily. (Patient not taking: Reported on 04/04/2015) 6 tablet 0 Completed Course at Unknown time   Scheduled: . amLODipine  10 mg Oral Daily  . benazepril  40 mg Oral Daily  . enoxaparin (LOVENOX) injection  40 mg Subcutaneous Daily  . [START ON 04/06/2015] fentaNYL  50 mcg Transdermal Q72H  . gabapentin  300 mg Oral QHS  . hydrochlorothiazide  12.5 mg Oral Daily  . levofloxacin (LEVAQUIN) IV  500 mg Intravenous Q24H  . levothyroxine  50 mcg Oral QAC breakfast  . mupirocin ointment   Topical BID  . nicotine  14 mg Transdermal Daily  . pantoprazole  40 mg Oral Daily  . pramipexole  0.125 mg Oral QHS  . pravastatin  40 mg Oral Daily  . sodium chloride  3 mL Intravenous Q12H  . tiotropium  18 mcg Inhalation Daily  . valACYclovir  500 mg Oral Daily  . vancomycin  500 mg Intravenous Q12H  . venlafaxine XR  150 mg Oral Daily   Continuous: . sodium chloride 75 mL/hr at 04/05/15 0447   WCB:[id number] **OR** acetaminophen, albuterol, carisoprodol, clonazePAM, diphenhydrAMINE, fluticasone, HYDROcodone-acetaminophen, HYDROmorphone (DILAUDID) injection, naproxen, ondansetron (ZOFRAN) IV, promethazine, QUEtiapine  Results for orders placed or performed during the hospital encounter of 04/04/15 (from the past 48 hour(s))  CK     Status: Abnormal   Collection Time: 04/04/15 10:00 PM  Result Value Ref Range   Total CK 8820 (H) 38 - 234 U/L    Comment: RESULTS CONFIRMED BY MANUAL DILUTION  Comprehensive metabolic panel     Status: Abnormal   Collection Time: 04/04/15 10:23 PM  Result Value Ref Range   Sodium 142 135 - 145 mmol/L   Potassium 4.1 3.5 - 5.1 mmol/L   Chloride 108 101 - 111 mmol/L   CO2 27 22 - 32 mmol/L   Glucose, Bld 92 65 - 99 mg/dL   BUN 22 (H) 6 - 20 mg/dL   Creatinine, Ser 1.22 (H) 0.44 - 1.00 mg/dL   Calcium 8.6 (L) 8.9 - 10.3  mg/dL   Total Protein 7.6 6.5 - 8.1 g/dL   Albumin 4.1 3.5 - 5.0 g/dL   AST 145 (H) 15 - 41 U/L   ALT 52 14 - 54 U/L   Alkaline Phosphatase 134 (H) 38 - 126 U/L   Total Bilirubin 0.3 0.3 - 1.2 mg/dL   GFR calc non Af Amer 47 (L) >60 mL/min   GFR calc Af Amer 55 (L) >60 mL/min    Comment: (NOTE) The eGFR has been calculated using the CKD EPI equation. This calculation has not been validated in all clinical situations. eGFR's persistently <60 mL/min signify possible Chronic Kidney Disease.    Anion gap 7 5 - 15  CBC with Differential     Status: Abnormal   Collection Time: 04/04/15 10:23 PM  Result Value Ref Range   WBC 10.5 4.0 - 10.5 K/uL   RBC 2.72 (L) 3.87 - 5.11 MIL/uL   Hemoglobin 8.3 (L) 12.0 - 15.0 g/dL   HCT 26.4 (L) 36.0 - 46.0 %   MCV 97.1 78.0 - 100.0 fL   MCH 30.5 26.0 - 34.0 pg   MCHC 31.4 30.0 - 36.0 g/dL   RDW 13.3 11.5 - 15.5 %   Platelets 282 150 - 400 K/uL   Neutrophils Relative % 63 43 - 77 %   Neutro Abs 6.6 1.7 - 7.7 K/uL   Lymphocytes Relative 29 12 - 46 %   Lymphs Abs 3.0 0.7 -  4.0 K/uL   Monocytes Relative 6 3 - 12 %   Monocytes Absolute 0.7 0.1 - 1.0 K/uL   Eosinophils Relative 2 0 - 5 %   Eosinophils Absolute 0.2 0.0 - 0.7 K/uL   Basophils Relative 0 0 - 1 %   Basophils Absolute 0.0 0.0 - 0.1 K/uL  Urine rapid drug screen (hosp performed)     Status: Abnormal   Collection Time: 04/05/15  1:42 AM  Result Value Ref Range   Opiates POSITIVE (A) NONE DETECTED   Cocaine NONE DETECTED NONE DETECTED   Benzodiazepines POSITIVE (A) NONE DETECTED   Amphetamines NONE DETECTED NONE DETECTED   Tetrahydrocannabinol NONE DETECTED NONE DETECTED   Barbiturates NONE DETECTED NONE DETECTED    Comment:        DRUG SCREEN FOR MEDICAL PURPOSES ONLY.  IF CONFIRMATION IS NEEDED FOR ANY PURPOSE, NOTIFY LAB WITHIN 5 DAYS.        LOWEST DETECTABLE LIMITS FOR URINE DRUG SCREEN Drug Class       Cutoff (ng/mL) Amphetamine      1000 Barbiturate       200 Benzodiazepine   211 Tricyclics       941 Opiates          300 Cocaine          300 THC              50   Sedimentation rate     Status: Abnormal   Collection Time: 04/05/15  3:10 AM  Result Value Ref Range   Sed Rate 33 (H) 0 - 22 mm/hr  Comprehensive metabolic panel     Status: Abnormal   Collection Time: 04/05/15  3:10 AM  Result Value Ref Range   Sodium 140 135 - 145 mmol/L   Potassium 3.8 3.5 - 5.1 mmol/L   Chloride 106 101 - 111 mmol/L   CO2 28 22 - 32 mmol/L   Glucose, Bld 89 65 - 99 mg/dL   BUN 20 6 - 20 mg/dL   Creatinine, Ser 1.09 (H) 0.44 - 1.00 mg/dL   Calcium 8.3 (L) 8.9 - 10.3 mg/dL   Total Protein 6.8 6.5 - 8.1 g/dL   Albumin 3.6 3.5 - 5.0 g/dL   AST 126 (H) 15 - 41 U/L   ALT 46 14 - 54 U/L   Alkaline Phosphatase 120 38 - 126 U/L   Total Bilirubin 0.6 0.3 - 1.2 mg/dL   GFR calc non Af Amer 54 (L) >60 mL/min   GFR calc Af Amer >60 >60 mL/min    Comment: (NOTE) The eGFR has been calculated using the CKD EPI equation. This calculation has not been validated in all clinical situations. eGFR's persistently <60 mL/min signify possible Chronic Kidney Disease.    Anion gap 6 5 - 15  CBC     Status: Abnormal   Collection Time: 04/05/15  3:10 AM  Result Value Ref Range   WBC 7.5 4.0 - 10.5 K/uL   RBC 3.57 (L) 3.87 - 5.11 MIL/uL   Hemoglobin 11.2 (L) 12.0 - 15.0 g/dL    Comment: RESULT REPEATED AND VERIFIED DELTA CHECK NOTED    HCT 35.5 (L) 36.0 - 46.0 %   MCV 99.4 78.0 - 100.0 fL   MCH 31.4 26.0 - 34.0 pg   MCHC 31.5 30.0 - 36.0 g/dL   RDW 13.4 11.5 - 15.5 %   Platelets 231 150 - 400 K/uL    Ct Hip Right W Wo Contrast  04/05/2015  CLINICAL DATA:  Evaluate for abscess.  EXAM: CT OF THE RIGHT HIP WITHOUT AND WITH CONTRAST  TECHNIQUE: Multidetector CT imaging was performed following the standard protocol before and after bolus administration of intravenous contrast.  CONTRAST:  173m OMNIPAQUE IOHEXOL 300 MG/ML  SOLN  COMPARISON:  MRI 04/04/2015  FINDINGS:  There is mild metal artifact from the right hip arthroplasty hardware. The bones appear intact. There is no bony destruction. There is no bone lesion. There is no fracture. Mildly altered attenuation within the gluteal musculature is consistent with edema associated with myositis. No soft tissue gas is evident. No drainable fluid collection is evident although there is image degradation due to the metal hardware.  IMPRESSION: No significant bony abnormality. Altered attenuation of the soft tissues suggests myositis. A drainable collection is not evident but there is mild metal degradation from the arthroplasty hardware.   Electronically Signed   By: DAndreas NewportM.D.   On: 04/05/2015 02:18   Mr Hip Right W Wo Contrast  04/05/2015   CLINICAL DATA:  Aching and throbbing wound over the right hip for approximately 5 days. History of right hip replacement 06/11/2009. Initial encounter.  EXAM: MRI OF THE RIGHT HIP WITHOUT AND WITH CONTRAST  TECHNIQUE: Multiplanar, multisequence MR imaging was performed both before and after administration of intravenous contrast.  CONTRAST:  13 mL MULTIHANCE GADOBENATE DIMEGLUMINE 529 MG/ML IV SOLN  COMPARISON:  None.  FINDINGS: Bones: Artifact from right hip replacement is noted. Bone marrow signal is normal. There is no evidence of osteomyelitis. No fracture is identified. No avascular necrosis of the left femoral head is seen. Postoperative change of lower lumbar fusion is noted.  Articular cartilage and labrum  Articular cartilage: Mild hyaline cartilage loss is seen about the left hip.  Labrum:  Appears intact.  Joint or bursal effusion  Joint effusion: Although evaluation is somewhat limited due to artifact, no hip joint effusion is seen on the right or left.  Bursae:  Unremarkable.  Muscles and tendons  Muscles and tendons: Intact. There is extensive edema with postcontrast enhancement in the gluteus maximus on the right. An ill defined fluid collection within the lateral  aspect of the gluteus maximus consistent with abscess or early phlegmon measures 2.9 cm AP x 4.8 cm AP x 5.5 cm craniocaudal. Edema and enhancement are also seen in the gluteus medius and minimus on the right consistent with myositis. No other abscess is identified. Two foci of edema and enhancement are seen in the left gluteal musculature. In the gluteus minimus, area of involvement measures 1.8 cm in diameter. In the gluteus medius on the left, area of involvement measures approximately 2.9 cm craniocaudal by 1.7 cm AP by 1.1 cm transverse.  Other findings  Miscellaneous:  Imaged intrapelvic contents are unremarkable.  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 No evidence of osteomyelitis or septic joint is identified.  Status post right hip replacement.  Small foci of soft tissue edema and enhancement in the left gluteus and medius muscles are most worrisome for focal myositis. No abscess is identified.   Electronically Signed   By: TInge RiseM.D.   On: 04/05/2015 07:18    ROS Blood pressure 102/56, pulse 75, temperature 98 F (36.7 Jill), temperature source Oral, resp. rate 18, weight 78 kg (171 lb 15.3 oz), SpO2 95 %. Physical Exam General: Alert, no acute distress Cardiovascular: No pedal edema Respiratory: No cyanosis, no  use of accessory musculature GI: No organomegaly, abdomen is soft and non-tender Skin: No lesions in the area of chief complaint Neurologic: Sensation intact distally Psychiatric: Patient is competent for consent with normal mood and affect Lymphatic: No axillary or cervical lymphadenopathy  MUSCULOSKELETAL: Patient is awake alert oriented 4 she appears in no acute distress she is taking and tolerating a normal breakfast this morning sitting upright. She does not have the appearance of terribly uncomfortable a shunt. Right leg is held in slight internal rotation. She has a  small abrasion open bulla over the right lateral aspect of the thigh about 3 or 4 inches distal to the greater trochanter this measures about 2 cm x 5 cm. It is open superficial and red with some exudate that appears to be a greenish as with pseudomonas. There is some minimal surrounding swelling present. Her range of motion right hip is mildly uncomfortable she's tender over the right gluteus muscle I buttock. Her strength and sensation in the right lower extremity is decreased over the right lateral by doc and trochanter area. Her strength in the lower extremity is normal though in terms of foot dorsiflexion plantar flexion the extension flexion strength hip abduction adduction and hip hip flexion testing. Has pain with abduction of the hip on the right. Left leg is normal.  Assessment: Patient's examination and her MRI scan are reviewed. Clinically her sugars are normal. Her creatinine 10 and BUN appear normal. She does have elevation of her CPK levels and these are suggestive of right muscle injury, versus infection versus inflammation. Her sedimentation rate at 33  is nearly normal. Jill reactive protein is not drawn. May wish to consider a check for further infection markers.  Patient's clinical exam and her history are suggestive of a problem of right sided blister associated with prolonged use of a dry heating pad for some 4 hours. She reports having had this occur over her back on 1 occasion in the past. The muscle changes on MRI scan may relate to an underlying infection but also may relate to inflammatory changes or edema changes either related to chronic pressure from direct pressure on this area for 4 hours as in a localized compartment syndrome. She clinically does not appear to be sick from the standpoint of systemic infection. She has a history of HSV but no recent concerns regarding that. I don't think she has a significant amount of immune compromised risks. At this time but I would recommend for  serial muscle enzyme evaluations to determine if her muscle enzymes show pattern of decreasing muscle injury. If that were the case then and this wouldn't less likely be an infection. Consider having the patient undergo a right-sided muscle biopsy percutaneously by radiology interventional services versus aspiration of the areas of fluid seen on the MRI scan. While there are small areas of fluid collection am not sure that these really represent abscess and having a sample of this for examination and culture would be appropriate before consideration of an open incision over an area where total hip in the form of hemiarthroplasty is present. We'll continue to follow this patient with you.  Plan: Plan for continued the evaluation and surveillance of this patient for risks of deep seated right gluteal infection or pyomyositis. Presently clinical markers are unremarkable with normal white cell count and very gradual sedimentation rate. Muscle proteins are elevated however this may relate to localize muscle compartment syndrome or due to direct pressure phenomena and heat that was applied for  nearly 4 hours of this past weekend.  [name], MD Cell [phone] Office [phone] 04/05/2015 8:45 AM

## 2015-04-05 NOTE — H&P (Signed)
Jill Phillips is an 59 y.o. female.    Pcp:  Dr. Marvel Plan [facility])  Chief Complaint:  pain HPI: 59 yo female with hx of chronic pain syndrome, Copd, Tobacco use,  Apparently c/o right hip pain.  Pt wonders if using the heating pad might have led to burn ? Which caused skin ulcer.  Pt is a poor historian and is not clear on the details of what led to the skin ulcer in this area.  She thinks might have started as a blister that popped on Saturday nite.   Pt denies fever, chills, cp, palp, sob, lower ext edema.  Pt will be admitted for w/up of skin ulcer,  R/o hematoma vs infection/ abscess.    Past Medical History  Diagnosis Date  . Postlaminectomy syndrome, thoracic region   . Osteoarthrosis, unspecified whether generalized or localized, lower leg   . Dysthymic disorder   . Calcifying tendinitis of shoulder   . Pain in joint, upper arm   . Chronic pain syndrome   . Lumbago   . Primary localized osteoarthrosis, lower leg   . Hypertension   . Hyperlipidemia   . GERD (gastroesophageal reflux disease)   . Thyroid disease   . Restless leg syndrome   . PONV (postoperative nausea and vomiting)   . Heart murmur   . Sleep apnea     s/p surgery- last sleep study 2011- doesnt use oxygen or machine at night as instructed  . History of blood transfusion 1980  . Hypothyroidism   . Active smoker   . COPD (chronic obstructive pulmonary disease)     on nocturnal home o2    Past Surgical History  Procedure Laterality Date  . Appendectomy    . Abdominal hysterectomy    . Tubal ligation    . Spine surgery      thoracic x 1,  lumbar x 15  . Right hip replacement    . Knee surgeries r knee      arthroscopy- right  . Hammer toe surgery    . Sleep apnea surgery    . Cholecystectomy    . Joint replacement    . Total knee arthroplasty  05/29/2012    Procedure: TOTAL KNEE ARTHROPLASTY;  Surgeon: Mcarthur Rossetti, MD;  Location: [facility];  Service: Orthopedics;  Laterality:  Right;  Right Total Knee Arthroplasty  . Back surgery      16 back surgeries  . Lumbar laminectomy/decompression microdiscectomy N/A 01/28/2014    Procedure: Minimally Invasive Right  L1-2 Microdiscectomy;  Surgeon: [name], MD;  Location: [place];  Service: Orthopedics;  Laterality: N/A;    Family History  Problem Relation Age of Onset  . Kidney disease Mother   . Heart disease Father   . Anuerysm Brother 29    brain  . Heart disease Brother   . Heart disease Sister 46    s/p CABG  . Hypertension Sister    Social History:  reports that she has been smoking Cigarettes.  She has a 67.5 pack-year smoking history. She has never used smokeless tobacco. She reports that she does not drink alcohol or use illicit drugs.  Allergies:  Allergies  Allergen Reactions  . Amoxicillin Other (See Comments)    REACTION: Oral yeast infection  . Chlorzoxazone Other (See Comments)    REACTION: headache  . Codeine Other (See Comments)    REACTION: headache  . Darvocet [Propoxyphene N-Acetaminophen] Itching  . Dilaudid [Hydromorphone Hcl] Itching  .  Flagyl [Metronidazole] Diarrhea  . Keflex [Cephalexin] Other (See Comments)    REACTION: unknown  . Morphine And Related Itching  . Nitrofurantoin Monohyd Macro Hives  . Percocet [Oxycodone-Acetaminophen] Itching  . Sulfa Antibiotics Other (See Comments)    REACTION: Yeast infection in mouth     (Not in a hospital admission)  Results for orders placed or performed during the hospital encounter of 04/04/15 (from the past 48 hour(s))  Comprehensive metabolic panel     Status: Abnormal   Collection Time: 04/04/15 10:23 PM  Result Value Ref Range   Sodium 142 135 - 145 mmol/L   Potassium 4.1 3.5 - 5.1 mmol/L   Chloride 108 101 - 111 mmol/L   CO2 27 22 - 32 mmol/L   Glucose, Bld 92 65 - 99 mg/dL   BUN 22 (H) 6 - 20 mg/dL   Creatinine, Ser 1.22 (H) 0.44 - 1.00 mg/dL   Calcium 8.6 (L) 8.9 - 10.3 mg/dL   Total Protein 7.6 6.5 - 8.1 g/dL    Albumin 4.1 3.5 - 5.0 g/dL   AST 145 (H) 15 - 41 U/L   ALT 52 14 - 54 U/L   Alkaline Phosphatase 134 (H) 38 - 126 U/L   Total Bilirubin 0.3 0.3 - 1.2 mg/dL   GFR calc non Af Amer 47 (L) >60 mL/min   GFR calc Af Amer 55 (L) >60 mL/min    Comment: (NOTE) The eGFR has been calculated using the CKD EPI equation. This calculation has not been validated in all clinical situations. eGFR's persistently <60 mL/min signify possible Chronic Kidney Disease.    Anion gap 7 5 - 15  CBC with Differential     Status: Abnormal   Collection Time: 04/04/15 10:23 PM  Result Value Ref Range   WBC 10.5 4.0 - 10.5 K/uL   RBC 2.72 (L) 3.87 - 5.11 MIL/uL   Hemoglobin 8.3 (L) 12.0 - 15.0 g/dL   HCT 26.4 (L) 36.0 - 46.0 %   MCV 97.1 78.0 - 100.0 fL   MCH 30.5 26.0 - 34.0 pg   MCHC 31.4 30.0 - 36.0 g/dL   RDW 13.3 11.5 - 15.5 %   Platelets 282 150 - 400 K/uL   Neutrophils Relative % 63 43 - 77 %   Neutro Abs 6.6 1.7 - 7.7 K/uL   Lymphocytes Relative 29 12 - 46 %   Lymphs Abs 3.0 0.7 - 4.0 K/uL   Monocytes Relative 6 3 - 12 %   Monocytes Absolute 0.7 0.1 - 1.0 K/uL   Eosinophils Relative 2 0 - 5 %   Eosinophils Absolute 0.2 0.0 - 0.7 K/uL   Basophils Relative 0 0 - 1 %   Basophils Absolute 0.0 0.0 - 0.1 K/uL   No results found.  Review of Systems  Constitutional: Negative.   HENT: Negative.   Eyes: Negative.   Respiratory: Negative.   Cardiovascular: Negative.   Gastrointestinal: Negative.   Musculoskeletal: Positive for back pain and joint pain. Negative for myalgias, falls and neck pain.  Skin: Negative.   Neurological: Negative.   Endo/Heme/Allergies: Negative.   Psychiatric/Behavioral: Negative.     Blood pressure 119/72, pulse 94, temperature 98.9 F (37.2 C), temperature source Oral, resp. rate 20, weight 66.225 kg (146 lb), SpO2 95 %. Physical Exam  Constitutional: She is oriented to person, place, and time. She appears well-developed and well-nourished.  HENT:  Head:  Normocephalic and atraumatic.  Mouth/Throat: No oropharyngeal exudate.  Eyes: Conjunctivae and EOM are normal.  Pupils are equal, round, and reactive to light. No scleral icterus.  Neck: Normal range of motion. Neck supple. No JVD present. No tracheal deviation present. No thyromegaly present.  Cardiovascular: Normal rate and regular rhythm.  Exam reveals no gallop and no friction rub.   No murmur heard. Respiratory: Effort normal and breath sounds normal. No respiratory distress. She has no wheezes. She has no rales.  GI: Soft. Bowel sounds are normal. She exhibits no distension. There is no tenderness. There is no rebound and no guarding.  Musculoskeletal: Normal range of motion. She exhibits no edema or tenderness.  Lymphadenopathy:    She has no cervical adenopathy.  Neurological: She is alert and oriented to person, place, and time. She has normal reflexes. She displays normal reflexes. No cranial nerve deficit. She exhibits normal muscle tone. Coordination normal.  Skin: Skin is warm and dry. No rash noted. There is erythema. No pallor.  Minimal erythema around the 2x3 cm skin ulcer on the right lateral thigh, 33m depth.  Slight ? Fluctuance bottom of the skin ulcer.    Psychiatric: She has a normal mood and affect. Her behavior is normal. Judgment and thought content normal.     Assessment/Plan Skin ulcer/ cellulitis r/o abscess Check CT scan hip.  Check esr,  Check blood culture x2 Check UDS Start on vanco iv pharmacy to dose,  levaquin 507miv qday.   Anemia Check iron studies b12, folate, esr Check cbc in am, will need outpatient follow up  Renal insufficiency Hydrate with ns iv  Copd Albuterol prn  DVT prophylaxis:  Scd, lovenox  Maude Hettich 04/05/2015, 12:31 AM

## 2015-04-05 NOTE — ED Provider Notes (Signed)
CSN: [id number]     Arrival date & time 04/04/15  2034 History   First MD Initiated Contact with Patient 04/04/15 2128     Chief Complaint  Patient presents with  . Wound Infection     (Consider location/radiation/quality/duration/timing/severity/associated sxs/prior Treatment) Patient is a 59 y.o. female presenting with hip pain.  Hip Pain This is a new problem. Episode onset: 3 days. The problem occurs constantly. The problem has been rapidly worsening. Pertinent negatives include no chest pain, no abdominal pain, no headaches and no shortness of breath. The symptoms are aggravated by walking. Nothing relieves the symptoms. The treatment provided no relief.    Past Medical History  Diagnosis Date  . Postlaminectomy syndrome, thoracic region   . Osteoarthrosis, unspecified whether generalized or localized, lower leg   . Dysthymic disorder   . Calcifying tendinitis of shoulder   . Pain in joint, upper arm   . Chronic pain syndrome   . Lumbago   . Primary localized osteoarthrosis, lower leg   . Hypertension   . Hyperlipidemia   . GERD (gastroesophageal reflux disease)   . Thyroid disease   . Restless leg syndrome   . PONV (postoperative nausea and vomiting)   . Heart murmur   . Sleep apnea     s/p surgery- last sleep study 2011- doesnt use oxygen or machine at night as instructed  . History of blood transfusion 1980  . Hypothyroidism   . Active smoker    Past Surgical History  Procedure Laterality Date  . Appendectomy    . Abdominal hysterectomy    . Tubal ligation    . Spine surgery      thoracic x 1,  lumbar x 15  . Right hip replacement    . Knee surgeries r knee      arthroscopy- right  . Hammer toe surgery    . Sleep apnea surgery    . Cholecystectomy    . Joint replacement    . Total knee arthroplasty  05/29/2012    Procedure: TOTAL KNEE ARTHROPLASTY;  Surgeon: Mcarthur Rossetti, MD;  Location: [facility];  Service: Orthopedics;  Laterality: Right;  Right  Total Knee Arthroplasty  . Back surgery      16 back surgeries  . Lumbar laminectomy/decompression microdiscectomy N/A 01/28/2014    Procedure: Minimally Invasive Right  L1-2 Microdiscectomy;  Surgeon: [name], MD;  Location: [place];  Service: Orthopedics;  Laterality: N/A;   Family History  Problem Relation Age of Onset  . Kidney disease Mother   . Heart disease Father   . Anuerysm Brother 29    brain  . Heart disease Brother   . Heart disease Sister 48    s/p CABG  . Hypertension Sister    History  Substance Use Topics  . Smoking status: Current Every Day Smoker -- 1.50 packs/day for 45 years    Types: Cigarettes  . Smokeless tobacco: Never Used     Comment: trying to quit  . Alcohol Use: No   OB History    No data available     Review of Systems  Constitutional: Negative for fever.  HENT: Negative for sore throat.   Eyes: Negative for visual disturbance.  Respiratory: Negative for cough and shortness of breath.   Cardiovascular: Negative for chest pain.  Gastrointestinal: Negative for nausea, vomiting, abdominal pain, constipation, blood in stool and anal bleeding.  Genitourinary: Negative for vaginal bleeding and difficulty urinating.  Musculoskeletal: Positive for myalgias (bilateral upper  arms) and arthralgias (right hip). Negative for back pain and neck pain.  Skin: Negative for rash.  Neurological: Negative for syncope and headaches.      Allergies  Amoxicillin; Chlorzoxazone; Codeine; Darvocet; Dilaudid; Flagyl; Keflex; Morphine and related; Nitrofurantoin monohyd macro; Percocet; and Sulfa antibiotics  Home Medications   Prior to Admission medications   Medication Sig Start Date End Date Taking? Authorizing Provider  acetaminophen (TYLENOL) 500 MG tablet Take 1,000 mg by mouth every 6 (six) hours as needed for moderate pain.    Yes Historical Provider, MD  albuterol (PROVENTIL HFA;VENTOLIN HFA) 108 (90 BASE) MCG/ACT inhaler Inhale 1 puff into the  lungs every 6 (six) hours as needed for wheezing or shortness of breath. 12/21/14  Yes Chesley Mires, MD  ALPRAZolam Duanne Moron) 1 MG tablet Take 1 tablet by mouth daily as needed for anxiety.  05/27/13  Yes Historical Provider, MD  amLODipine (NORVASC) 10 MG tablet TAKE 1 TABLET (10 MG TOTAL) BY MOUTH DAILY. 08/11/14  Yes Alexa Sherral Hammers, MD  benazepril (LOTENSIN) 40 MG tablet TAKE 1 TABLET BY MOUTH DAILY. 08/29/14  Yes Alexa Sherral Hammers, MD  carisoprodol (SOMA) 350 MG tablet TAKE 1 TABLET BY MOUTH EVERY 8 HOURS AS NEEDED. 03/30/15  Yes Meredith Staggers, MD  clonazePAM (KLONOPIN) 2 MG tablet TAKE 1 TABLET BY MOUTH TWICE DAILY AS NEEDED 03/24/15  Yes Meredith Staggers, MD  fentaNYL (DURAGESIC) 50 MCG/HR Place 1 patch (50 mcg total) onto the skin every 3 (three) days. 03/17/15  Yes Bayard Hugger, NP  fluticasone (FLONASE) 50 MCG/ACT nasal spray Place 2 sprays into both nostrils daily. Patient taking differently: Place 2 sprays into both nostrils daily as needed for allergies.  05/05/14 05/05/15 Yes Alexa Sherral Hammers, MD  gabapentin (NEURONTIN) 300 MG capsule Take 1 capsule (300 mg total) by mouth at bedtime. 01/18/15  Yes Meredith Staggers, MD  hydrochlorothiazide (HYDRODIURIL) 12.5 MG tablet Take 1 tablet by mouth daily. 03/09/14  Yes Historical Provider, MD  HYDROcodone-acetaminophen (NORCO) 10-325 MG per tablet Take 1 tablet by mouth every 8 (eight) hours as needed for severe pain. 03/17/15  Yes Bayard Hugger, NP  levothyroxine (SYNTHROID, LEVOTHROID) 50 MCG tablet Take 1 tablet (50 mcg total) by mouth daily before breakfast. 02/15/15  Yes Alexa Sherral Hammers, MD  naproxen (NAPROSYN) 500 MG tablet Take 1 tablet (500 mg total) by mouth at bedtime as needed for moderate pain. 02/22/15  Yes Alexa Sherral Hammers, MD  nicotine (NICODERM CQ - DOSED IN MG/24 HOURS) 14 mg/24hr patch Place 1 patch (14 mg total) onto the skin daily. 11/29/14  Yes Alexa Sherral Hammers, MD  omeprazole (PRILOSEC) 20 MG capsule TAKE 1 CAPSULE BY  MOUTH 2 TIMES DAILY BEFORE A MEAL. 08/02/14  Yes Alexa Sherral Hammers, MD  pramipexole (MIRAPEX) 0.125 MG tablet 0.125 mg q hst for three days, then bid thereafter. Patient taking differently: Take 0.125 mg by mouth at bedtime.  03/09/15  Yes Meredith Staggers, MD  pravastatin (PRAVACHOL) 40 MG tablet TAKE 1 TABLET BY MOUTH DAILY. 01/09/15  Yes Alexa Sherral Hammers, MD  promethazine (PHENERGAN) 25 MG tablet Take 1 tablet by mouth every 6 (six) hours as needed for nausea or vomiting.  02/18/14  Yes Historical Provider, MD  Psyllium (NATURAL FIBER LAXATIVE PO) Take 2 tablets by mouth daily as needed (constipation).   Yes Historical Provider, MD  QUEtiapine (SEROQUEL) 50 MG tablet Take 50-100 mg by mouth at bedtime as needed (sleep).  08/05/14  Yes Historical Provider,  MD  tiotropium (SPIRIVA) 18 MCG inhalation capsule Place 1 capsule (18 mcg total) into inhaler and inhale daily. 12/21/14  Yes Chesley Mires, MD  valACYclovir (VALTREX) 500 MG tablet TAKE 1 TABLET BY MOUTH DAILY. 10/27/14  Yes Alexa Sherral Hammers, MD  venlafaxine XR (EFFEXOR-XR) 150 MG 24 hr capsule Take 150 mg by mouth daily. 05/02/14  Yes Historical Provider, MD  VOLTAREN 1 % GEL APPLY 2 GRAMS TO AFFECTED AREA 3 TIMES DAILY AS NEEDED FOR PAIN. 01/31/15  Yes Meredith Staggers, MD  ciprofloxacin (CIPRO) 250 MG tablet Take 1 tablet (250 mg total) by mouth 2 (two) times daily. Patient not taking: Reported on 04/04/2015 02/15/15   Alexa Sherral Hammers, MD   BP 119/72 mmHg  Pulse 94  Temp(Src) 98.9 F (37.2 C) (Oral)  Resp 20  Wt 146 lb (66.225 kg)  SpO2 95% Physical Exam  Constitutional: She is oriented to person, place, and time. She appears well-developed and well-nourished. No distress.  HENT:  Head: Normocephalic and atraumatic.  Eyes: Conjunctivae and EOM are normal.  Neck: Normal range of motion.  Cardiovascular: Normal rate, regular rhythm, normal heart sounds and intact distal pulses.  Exam reveals no gallop and no friction rub.   No murmur  heard. Pulmonary/Chest: Effort normal and breath sounds normal. No respiratory distress. She has no wheezes. She has no rales.  Abdominal: Soft. She exhibits no distension. There is no tenderness. There is no guarding.  Musculoskeletal: She exhibits no edema or tenderness.  Neurological: She is alert and oriented to person, place, and time.  Skin: Skin is warm and dry. Rash noted. She is not diaphoretic. There is erythema.  Ulceration 4cm inferior to right arthroplasty incision, mild surrounding erythema and erythema along prior incision Area of pain/inducation inferior to incision along gluteus measuring 6cm in diameter on exam Superficial areas of erythema/linear/appearance of scratches over abdomen, upper legs   Nursing note and vitals reviewed.   ED Course  Procedures (including critical care time) Labs Review Labs Reviewed  COMPREHENSIVE METABOLIC PANEL - Abnormal; Notable for the following:    BUN 22 (*)    Creatinine, Ser 1.22 (*)    Calcium 8.6 (*)    AST 145 (*)    Alkaline Phosphatase 134 (*)    GFR calc non Af Amer 47 (*)    GFR calc Af Amer 55 (*)    All other components within normal limits  CBC WITH DIFFERENTIAL/PLATELET - Abnormal; Notable for the following:    RBC 2.72 (*)    Hemoglobin 8.3 (*)    HCT 26.4 (*)    All other components within normal limits  CULTURE, BLOOD (ROUTINE X 2)  CULTURE, BLOOD (ROUTINE X 2)  CK    Imaging Review No results found.   EKG Interpretation None      MDM   Final diagnoses:  Right hip pain   59 year old female with history of COPD, hypertension, hypothyroidism, chronic pain, multiple back surgeries, right hip Arnell Sieving last seen in 2011 with Dr. [name] presents with concern of right hip pain and erythema around her old incision.  On exam patient has a approximately 4 cm area of ulceration and near to her incision with some mild surrounding erythema and area of fluctuance/induration. MRI of the right hip was obtained which  showed significant right gluteal myositis as well as developing intramuscular abscess. Blood cultures were ordered and patient was given vancomycin. Of note patient has a hemoglobin of 8.3 on CBC which is new from  prior one year ago of 12. Patient denies any symptoms of GI bleeding and denies any recent falls.  Discussed patient with [facility] Dr. Durward Fortes who is in agreement with the plan to admit patient and continue IV anti-biotics.    Gareth Morgan, MD 04/05/15 [phone]

## 2015-04-05 NOTE — Progress Notes (Addendum)
Patient Demographics  Jill Phillips, is a 59 y.o. female, DOB - March 06, 1956, UK:[id number]  Admit date - 04/04/2015   Admitting Physician Jani Gravel, MD  Outpatient Primary MD for the patient is Osa Craver, MD  LOS - 0   Chief Complaint  Patient presents with  . Wound Infection       Admission HPI/Brief narrative: 59 yo female with hx of chronic pain syndrome, COPD, Tobacco use, presents with right hip pain. Patient with chronic lower back pain. Reports she was using heating pad on her lower back yesterday, slipped while using it, thinking it was under her buttocks area which was sleeping, she woke up she had right buttock /hip pain, as well she developed right hip bullae from the heating pad as well. A febrile, no leukocytosis, MRI with finding consistent with myositis, and questionable ill-defined abscess and gluteus maximus area.  Subjective:   Jill Phillips today has, No headache, No chest pain, No abdominal pain - No Nausea, No new weakness tingling or numbness, No Cough - SOB.   Assessment & Plan    Active Problems:   Buttock pain   Anemia   Renal insufficiency   Skin ulcer   right hip/buttocks pain  - Orthopedic consult greatly appreciated wide differentials , discussed with Dr. [name], MRI findings most likely related to muscle injury/myositis from heating pad/and direct pressure phenomena from sitting with a heating pad for few hours over the weekend , differentials include local compartment syndrome especially with elevated CK level , will continue with IV fluids, monitor total CK closely , infectious etiology is less likely in absence of leukocytosis and fever , to continue with IV antibiotics empirically , check pro-calcitonin, and monitor clinically, if signs of infection arise, may need IR aspiration , may need biopsy if no improvement of symptoms. - Apply bacitracin to skin  ulcer, consult wound care, possible burn injury from heat pads  Acute kidney injury  - Renal function back to baseline , possible rhabdomyolysis in the setting of significantly elevated total CK  - Continue with IV fluids    anemia  - Initial hemoglobin is 8.3, repeat is 11.2 , continue to monitor    COPD - Opinion with home medication, no active wheezing, on when necessary meds   Code Status: Full  Family Communication: None at bedside  Disposition Plan: Home when stable   Procedures  None   Consults   Orthopedic   Medications  Scheduled Meds: . amLODipine  10 mg Oral Daily  . bacitracin   Topical BID  . benazepril  40 mg Oral Daily  . enoxaparin (LOVENOX) injection  40 mg Subcutaneous Daily  . [START ON 04/06/2015] fentaNYL  50 mcg Transdermal Q72H  . gabapentin  300 mg Oral QHS  . hydrochlorothiazide  12.5 mg Oral Daily  . levofloxacin (LEVAQUIN) IV  500 mg Intravenous Q24H  . levothyroxine  50 mcg Oral QAC breakfast  . mupirocin ointment   Topical BID  . nicotine  14 mg Transdermal Daily  . pantoprazole  40 mg Oral Daily  . pramipexole  0.125 mg Oral QHS  . pravastatin  40 mg Oral Daily  . sodium chloride  3 mL Intravenous Q12H  . tiotropium  18 mcg  Inhalation Daily  . valACYclovir  500 mg Oral Daily  . vancomycin  500 mg Intravenous Q12H  . venlafaxine XR  150 mg Oral Daily   Continuous Infusions: . sodium chloride 75 mL/hr at 04/05/15 0447   PRN Meds:.acetaminophen **OR** acetaminophen, albuterol, carisoprodol, clonazePAM, diphenhydrAMINE, fluticasone, HYDROcodone-acetaminophen, HYDROmorphone (DILAUDID) injection, naproxen, ondansetron (ZOFRAN) IV, promethazine, QUEtiapine  DVT Prophylaxis  Lovenox -  Lab Results  Component Value Date   PLT 231 04/05/2015    Antibiotics    Anti-infectives    Start     Dose/Rate Route Frequency Ordered Stop   04/05/15 1200  vancomycin (VANCOCIN) 500 mg in sodium chloride 0.9 % 100 mL IVPB     500 mg 100 mL/hr  over 60 Minutes Intravenous Every 12 hours 04/05/15 0024     04/05/15 1000  valACYclovir (VALTREX) tablet 500 mg     500 mg Oral Daily 04/05/15 0248     04/05/15 0400  levofloxacin (LEVAQUIN) IVPB 500 mg     500 mg 100 mL/hr over 60 Minutes Intravenous Every 24 hours 04/05/15 0248     04/05/15 0030  vancomycin (VANCOCIN) IVPB 1000 mg/200 mL premix     1,000 mg 200 mL/hr over 60 Minutes Intravenous  Once 04/05/15 0024 04/05/15 0153          Objective:   Filed Vitals:   04/05/15 0511 04/05/15 0930 04/05/15 1029 04/05/15 1404  BP: 102/56 88/52  90/58  Pulse: 75 82  78  Temp: 98 F (36.7 C)   99.2 F (37.3 C)  TempSrc: Oral   Oral  Resp: 18   17  Weight:      SpO2: 95%  84% 90%    Wt Readings from Last 3 Encounters:  04/05/15 78 kg (171 lb 15.3 oz)  04/04/15 66.225 kg (146 lb)  02/15/15 67.677 kg (149 lb 3.2 oz)     Intake/Output Summary (Last 24 hours) at 04/05/15 1442 Last data filed at 04/05/15 1406  Gross per 24 hour  Intake   1020 ml  Output    500 ml  Net    520 ml     Physical Exam  Awake Alert, Oriented X 3, No new F.N deficits, Normal affect [place].AT,PERRAL Supple Neck,No JVD, No cervical lymphadenopathy appriciated.  Symmetrical Chest wall movement, Good air movement bilaterally, CTAB RRR,No Gallops,Rubs or new Murmurs, No Parasternal Heave +ve B.Sounds, Abd Soft, No tenderness, No organomegaly appriciated, No rebound - guarding or rigidity. No Cyanosis, right buttock area erythema, with skin ulceration in the lateral side, no drainage or bleed.   Data Review   Micro Results No results found for this or any previous visit (from the past 240 hour(s)).  Radiology Reports Ct Hip Right W Wo Contrast  04/05/2015   CLINICAL DATA:  Evaluate for abscess.  EXAM: CT OF THE RIGHT HIP WITHOUT AND WITH CONTRAST  TECHNIQUE: Multidetector CT imaging was performed following the standard protocol before and after bolus administration of intravenous contrast.   CONTRAST:  193mL OMNIPAQUE IOHEXOL 300 MG/ML  SOLN  COMPARISON:  MRI 04/04/2015  FINDINGS: There is mild metal artifact from the right hip arthroplasty hardware. The bones appear intact. There is no bony destruction. There is no bone lesion. There is no fracture. Mildly altered attenuation within the gluteal musculature is consistent with edema associated with myositis. No soft tissue gas is evident. No drainable fluid collection is evident although there is image degradation due to the metal hardware.  IMPRESSION: No significant bony abnormality. Altered  attenuation of the soft tissues suggests myositis. A drainable collection is not evident but there is mild metal degradation from the arthroplasty hardware.   Electronically Signed   By: Andreas Newport M.D.   On: 04/05/2015 02:18   Mr Lumbar Spine W Wo Contrast  03/31/2015   CLINICAL DATA:  Worsening back pain extending to the hips and groin, several months duration. Multiple previous lumbar surgeries. No known injury. Most recent surgery 2015.  EXAM: MRI LUMBAR SPINE WITHOUT AND WITH CONTRAST  TECHNIQUE: Multiplanar and multiecho pulse sequences of the lumbar spine were obtained without and with intravenous contrast.  CONTRAST:  81mL MULTIHANCE GADOBENATE DIMEGLUMINE 529 MG/ML IV SOLN  COMPARISON:  Myelography 07/06/2014.  MRI 03/25/2014.  FINDINGS: T11-12: Previous discectomy and fusion with pedicle screws. Fusion is probably solid. Sufficient patency of the canal and foramina.  T12-L1: Mild bulging of the disc. Mild facet hypertrophy. No canal or foraminal stenosis.  L1-2: Chronic degenerative disc disease with disc degeneration near complete loss of disc height. Previous decompressive surgery on the right which appears to be a partial hemilaminectomy and foramina ME. Endplate osteophytes and bulging of the disc. The central canal is sufficiently patent. There is some epidural fibrosis along the right side. There continues to be foraminal narrowing on the  right that could affect the right L1 nerve root. No compressive foraminal stenosis on the left.  L2-3: Disc degeneration with endplate osteophytes and shallow protrusion of disc material. Bilateral facet and ligamentous hypertrophy. Mild stenosis of both lateral recesses without gross neural compression.  L3-4: Previous discectomy and fusion. Wide patency of the canal and foramina. No complicating features.  L4 to sacrum: Previous discectomy and fusion. Wide patency of the canal and foramina. 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  IMPRESSION: Since the previous study, the patient has undergone right-sided surgery at L1-2. There is mild epidural fibrosis along the right side of the thecal sac. There are endplate osteophytes in there is bulging of the disc, more towards the right. There is some continued narrowing of the foramen on the right that could possibly continue to affect the right L1 nerve root. Definite neural compression is not established.  Satisfactory appearance at the fusion levels of T11-12, L3-4 and L4 to sacrum.  Moderate degenerative changes at L2-3 but without evidence of focal neural compression.   Electronically Signed   By: Nelson Chimes M.D.   On: 03/31/2015 07:39   Mr Hip Right W Wo Contrast  04/05/2015   CLINICAL DATA:  Aching and throbbing wound over the right hip for approximately 5 days. History of right hip replacement 06/11/2009. Initial encounter.  EXAM: MRI OF THE RIGHT HIP WITHOUT AND WITH CONTRAST  TECHNIQUE: Multiplanar, multisequence MR imaging was performed both before and after administration of intravenous contrast.  CONTRAST:  13 mL MULTIHANCE GADOBENATE DIMEGLUMINE 529 MG/ML IV SOLN  COMPARISON:  None.  FINDINGS: Bones: Artifact from right hip replacement is noted. Bone marrow signal is normal. There is no evidence of osteomyelitis. No fracture is identified. No avascular necrosis of the left femoral head is seen. Postoperative  change of lower lumbar fusion is noted.  Articular cartilage and labrum  Articular cartilage: Mild hyaline cartilage loss is seen about the left hip.  Labrum:  Appears intact.  Joint or bursal effusion  Joint effusion: Although evaluation is somewhat limited due to artifact, no hip joint effusion is seen on the right or left.  Bursae:  Unremarkable.  Muscles and tendons  Muscles and tendons: Intact. There is extensive edema with postcontrast enhancement in the gluteus maximus on the right. An ill defined fluid collection within the lateral aspect of the gluteus maximus consistent with abscess or early phlegmon measures 2.9 cm AP x 4.8 cm AP x 5.5 cm craniocaudal. Edema and enhancement are also seen in the gluteus medius and minimus on the right consistent with myositis. No other abscess is identified. Two foci of edema and enhancement are seen in the left gluteal musculature. In the gluteus minimus, area of involvement measures 1.8 cm in diameter. In the gluteus medius on the left, area of involvement measures approximately 2.9 cm craniocaudal by 1.7 cm AP by 1.1 cm transverse.  Other findings  Miscellaneous:  Imaged intrapelvic contents are unremarkable.  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 No evidence of osteomyelitis or septic joint is identified.  Status post right hip replacement.  Small foci of soft tissue edema and enhancement in the left gluteus and medius muscles are most worrisome for focal myositis. No abscess is identified.   Electronically Signed   By: Inge Rise M.D.   On: 04/05/2015 07:18     CBC  Recent Labs Lab 04/04/15 2223 04/05/15 0310  WBC 10.5 7.5  HGB 8.3* 11.2*  HCT 26.4* 35.5*  PLT 282 231  MCV 97.1 99.4  MCH 30.5 31.4  MCHC 31.4 31.5  RDW 13.3 13.4  LYMPHSABS 3.0  --   MONOABS 0.7  --   EOSABS 0.2  --   BASOSABS 0.0  --     Chemistries   Recent Labs Lab  04/04/15 2223 04/05/15 0310  NA 142 140  K 4.1 3.8  CL 108 106  CO2 27 28  GLUCOSE 92 89  BUN 22* 20  CREATININE 1.22* 1.09*  CALCIUM 8.6* 8.3*  AST 145* 126*  ALT 52 46  ALKPHOS 134* 120  BILITOT 0.3 0.6   ------------------------------------------------------------------------------------------------------------------ estimated creatinine clearance is 55.5 mL/min (by C-G formula based on Cr of 1.09). ------------------------------------------------------------------------------------------------------------------ No results for input(s): HGBA1C in the last 72 hours. ------------------------------------------------------------------------------------------------------------------ No results for input(s): CHOL, HDL, LDLCALC, TRIG, CHOLHDL, LDLDIRECT in the last 72 hours. ------------------------------------------------------------------------------------------------------------------ No results for input(s): TSH, T4TOTAL, T3FREE, THYROIDAB in the last 72 hours.  Invalid input(s): FREET3 ------------------------------------------------------------------------------------------------------------------  Recent Labs  04/05/15 0310  VITAMINB12 244  FERRITIN 38    Coagulation profile No results for input(s): INR, PROTIME in the last 168 hours.  No results for input(s): DDIMER in the last 72 hours.  Cardiac Enzymes No results for input(s): CKMB, TROPONINI, MYOGLOBIN in the last 168 hours.  Invalid input(s): CK ------------------------------------------------------------------------------------------------------------------ Invalid input(s): POCBNP     Time Spent in minutes   30 minutes   Mearl Harewood M.D on 04/05/2015 at 2:42 PM  Between 7am to 7pm - Pager - [phone]  After 7pm go to [email or web address] - password [facility]  [facility]   Office  [phone]

## 2015-04-06 ENCOUNTER — Inpatient Hospital Stay (HOSPITAL_COMMUNITY): Payer: Medicare Other

## 2015-04-06 DIAGNOSIS — M609 Myositis, unspecified: Principal | ICD-10-CM

## 2015-04-06 DIAGNOSIS — D649 Anemia, unspecified: Secondary | ICD-10-CM

## 2015-04-06 DIAGNOSIS — M791 Myalgia: Secondary | ICD-10-CM

## 2015-04-06 LAB — COMPREHENSIVE METABOLIC PANEL
ALT: 34 U/L (ref 14–54)
AST: 66 U/L — ABNORMAL HIGH (ref 15–41)
Albumin: 3.1 g/dL — ABNORMAL LOW (ref 3.5–5.0)
Alkaline Phosphatase: 104 U/L (ref 38–126)
Anion gap: 7 (ref 5–15)
BUN: 13 mg/dL (ref 6–20)
CO2: 25 mmol/L (ref 22–32)
Calcium: 8.3 mg/dL — ABNORMAL LOW (ref 8.9–10.3)
Chloride: 111 mmol/L (ref 101–111)
Creatinine, Ser: 0.94 mg/dL (ref 0.44–1.00)
GFR calc Af Amer: >60 mL/min (ref >60)
GFR calc non Af Amer: >60 mL/min (ref >60)
Glucose, Bld: 107 mg/dL — ABNORMAL HIGH (ref 65–99)
Potassium: 3.6 mmol/L (ref 3.5–5.1)
Sodium: 143 mmol/L (ref 135–145)
Total Bilirubin: 0.4 mg/dL (ref 0.3–1.2)
Total Protein: 6 g/dL — ABNORMAL LOW (ref 6.5–8.1)

## 2015-04-06 LAB — CK
Total CK: 3206 U/L — ABNORMAL HIGH (ref 38–234)
Total CK: 2851 U/L — ABNORMAL HIGH (ref 38–234)

## 2015-04-06 LAB — CBC
HCT: 33.1 % — ABNORMAL LOW (ref 36.0–46.0)
Hemoglobin: 10.2 g/dL — ABNORMAL LOW (ref 12.0–15.0)
MCH: 29.7 pg (ref 26.0–34.0)
MCHC: 30.8 g/dL (ref 30.0–36.0)
MCV: 96.5 fL (ref 78.0–100.0)
Platelets: 183 10*3/uL (ref 150–400)
RBC: 3.43 MIL/uL — ABNORMAL LOW (ref 3.87–5.11)
RDW: 13 % (ref 11.5–15.5)
WBC: 5.4 10*3/uL (ref 4.0–10.5)

## 2015-04-06 MED ORDER — CYANOCOBALAMIN 1000 MCG/ML IJ SOLN
1000.0000 ug | Freq: Once | INTRAMUSCULAR | Status: AC
  Administered 2015-04-06: 1000 ug via INTRAMUSCULAR
  Filled 2015-04-06: qty 1

## 2015-04-06 MED ORDER — SODIUM CHLORIDE 0.9 % IV SOLN: INTRAVENOUS | Status: AC

## 2015-04-06 MED ORDER — CYANOCOBALAMIN 250 MCG PO TABS
250.0000 ug | ORAL_TABLET | Freq: Every day | ORAL | Status: DC
  Administered 2015-04-07: 250 ug via ORAL
  Filled 2015-04-06: qty 1

## 2015-04-06 MED ORDER — HYDROCODONE-ACETAMINOPHEN 10-325 MG PO TABS
1.0000 | ORAL_TABLET | ORAL | Status: DC | PRN
  Administered 2015-04-06 – 2015-04-07 (×3): 1 via ORAL
  Filled 2015-04-06 (×3): qty 1

## 2015-04-06 MED ORDER — FENTANYL CITRATE (PF) 100 MCG/2ML IJ SOLN
INTRAMUSCULAR | Status: AC
  Administered 2015-04-06: 50 ug via INTRAVENOUS
  Filled 2015-04-06: qty 6

## 2015-04-06 MED ORDER — FENTANYL CITRATE (PF) 100 MCG/2ML IJ SOLN
50.0000 ug | Freq: Once | INTRAMUSCULAR | Status: AC
  Administered 2015-04-06: 50 ug via INTRAVENOUS

## 2015-04-06 MED ORDER — VANCOMYCIN HCL IN DEXTROSE 750-5 MG/150ML-% IV SOLN: 750.0000 mg | Freq: Two times a day (BID) | INTRAVENOUS | Status: DC

## 2015-04-06 MED ORDER — VANCOMYCIN HCL IN DEXTROSE 750-5 MG/150ML-% IV SOLN
750.0000 mg | Freq: Two times a day (BID) | INTRAVENOUS | Status: DC
  Administered 2015-04-06 – 2015-04-07 (×3): 750 mg via INTRAVENOUS
  Filled 2015-04-06 (×3): qty 150

## 2015-04-06 NOTE — Care Management Note (Signed)
Case Management Note  Patient Details  Name: Jill Phillips MRN: [id number] Date of Birth: 01/29/1956  Subjective/Objective:                   right gluteal myositis Action/Plan:  Discharge planning Expected Discharge Date:   (unknown)               Expected Discharge Plan:  Home/Self Care  In-House Referral:     Discharge planning Services  CM Consult  Post Acute Care Choice:    Choice offered to:     DME Arranged:    DME Agency:     HH Arranged:    HH Agency:     Status of Service:  In process, will continue to follow  Medicare Important Message Given:    Date Medicare IM Given:    Medicare IM give by:    Date Additional Medicare IM Given:    Additional Medicare Important Message give by:     If discussed at Long Length of Stay Meetings, dates discussed:    Additional Comments: CM met with pt to discuss home needs.  Pt states she has a rolling walker, cane, and crutches at home.  Pt ambulating her with rolling walker.  Do not anticipate any home needs but will continue to monitor for changes. ,  Christine, RN 04/06/2015, 3:17 PM  

## 2015-04-06 NOTE — Progress Notes (Signed)
ANTIBIOTIC CONSULT NOTE   Pharmacy Consult for Vancomycin Indication: cellulitis/abscess  Allergies  Allergen Reactions  . Amoxicillin Other (See Comments)    REACTION: Oral yeast infection  . Chlorzoxazone Other (See Comments)    REACTION: headache  . Codeine Other (See Comments)    REACTION: headache  . Darvocet [Propoxyphene N-Acetaminophen] Itching  . Dilaudid [Hydromorphone Hcl] Itching  . Flagyl [Metronidazole] Diarrhea  . Keflex [Cephalexin] Other (See Comments)    REACTION: unknown  . Morphine And Related Itching  . Nitrofurantoin Monohyd Macro Hives  . Percocet [Oxycodone-Acetaminophen] Itching  . Sulfa Antibiotics Other (See Comments)    REACTION: Yeast infection in mouth    Patient Measurements: Height: 5\' 2"  (157.5 cm) Weight: 171 lb 15.3 oz (78 kg) IBW/kg (Calculated) : 50.1   Vital Signs: Temp: 99.6 F (37.6 C) (08/04 0419) Temp Source: Oral (08/04 0419) BP: 124/67 mmHg (08/04 0419) Pulse Rate: 74 (08/04 0419) Intake/Output from previous day: 08/03 0701 - 08/04 0700 In: 3082.9 [P.O.:1980; I.V.:802.9; IV Piggyback:300] Out: 2185 [Urine:2185] Intake/Output from this shift:    Labs:  Recent Labs  04/04/15 2223 04/05/15 0310 04/06/15 0429  WBC 10.5 7.5 5.4  HGB 8.3* 11.2* 10.2*  PLT 282 231 183  CREATININE 1.22* 1.09* 0.94   Estimated Creatinine Clearance: 61.6 mL/min (by C-G formula based on Cr of 0.94). No results for input(s): VANCOTROUGH, VANCOPEAK, VANCORANDOM, GENTTROUGH, GENTPEAK, GENTRANDOM, TOBRATROUGH, TOBRAPEAK, TOBRARND, AMIKACINPEAK, AMIKACINTROU, AMIKACIN in the last 72 hours.   Microbiology: No results found for this or any previous visit (from the past 720 hour(s)).  Scheduled:  . amLODipine  10 mg Oral Daily  . bacitracin   Topical BID  . benazepril  40 mg Oral Daily  . enoxaparin (LOVENOX) injection  40 mg Subcutaneous Daily  . fentaNYL  50 mcg Transdermal Q72H  . gabapentin  300 mg Oral QHS  . hydrochlorothiazide  12.5  mg Oral Daily  . levofloxacin (LEVAQUIN) IV  500 mg Intravenous Q24H  . levothyroxine  50 mcg Oral QAC breakfast  . mupirocin ointment   Topical BID  . nicotine  14 mg Transdermal Daily  . pantoprazole  40 mg Oral Daily  . pramipexole  0.125 mg Oral QHS  . pravastatin  40 mg Oral Daily  . sodium chloride  3 mL Intravenous Q12H  . tiotropium  18 mcg Inhalation Daily  . valACYclovir  500 mg Oral Daily  . vancomycin  500 mg Intravenous Q12H  . venlafaxine XR  150 mg Oral Daily   Infusions:  . sodium chloride 100 mL/hr at 04/05/15 2055   Assessment: 76 yoF c/o aching/throbbing of open wound on right hip. Patient fell asleep with heating pad to the area and awoke witih buttock/hip pain.  MRI consistent with myositis and possible ill-defined abscess. Vancomycin per Rx for cellulitis/abscess.   8/3 >> vancomycin >> 8/3 >> levaquin (MD dosing) >>    8/2 blood: collected 8/4 R gluteal aspiration: pending  Renal: SCr improved (from possible rhabdomyolysis) WBC WNL afebrile  Goal of Therapy:  Vancomycin trough level 10-15 mcg/ml  Plan:   SCr improved, change vancomycin to 750mg  IV q12h  Trough if remains of vancomycin for > 3 days.  Follow cultures and renal function  Consider stopping levofloxacin  Doreene Eland, PharmD, BCPS.   Pager: [phone]  04/06/2015,12:36 PM

## 2015-04-06 NOTE — Consult Note (Signed)
WOC wound consult note Reason for Consult:Thermal injury from heating pad left in place for 4 hours over weekend.  Dr. [name] has already seen and recommends bacitracin ointment.  I agree with this POC and have given dressing orders for Nursing. Wound type: Pressure Ulcer POA: No Measurement:4.5cm x 1.5cm with depth obscured due to presence of necrotic slough in center. Wound bed:A described above. Drainage (amount, consistency, odor) scant serous Periwound:intact, dry  Dressing procedure/placement/frequency: Bacitracin ointment as ordered by Dr. [name]. [place] nursing team will not follow, but will remain available to this patient, the nursing and medical team.  Please re-consult if needed. Thanks, Maudie Flakes, MSN, RN, [place], [place], [place] [phone])

## 2015-04-06 NOTE — Progress Notes (Signed)
      Subjective: I feel better than yesterday. Taking not much for pain every 8 hours, vicodin. Little bit of chills last evening. Temp 99.3 Smokes  > 1 ppd.  History of emphysema. Toleratin diet.  Patient reports pain as mild.    Objective:   VITALS:  Temp:  [99.2 F (37.3 C)-99.6 F (37.6 C)] 99.6 F (37.6 C) (08/04 0419) Pulse Rate:  [74-82] 74 (08/04 0419) Resp:  [16-18] 16 (08/04 0419) BP: (88-124)/(52-67) 124/67 mmHg (08/04 0419) SpO2:  [84 %-94 %] 91 % (08/04 0419) Weight:  [78 kg (171 lb 15.3 oz)] 78 kg (171 lb 15.3 oz) (08/03 2350)  Neurologically intact ABD soft Neurovascular intact Sensation intact distally Dorsiflexion/Plantar flexion intact Compartment soft   LABS  Recent Labs  04/04/15 2223 04/05/15 0310 04/06/15 0429  HGB 8.3* 11.2* 10.2*  WBC 10.5 7.5 5.4  PLT 282 231 183    Recent Labs  04/05/15 0310 04/06/15 0429  NA 140 143  K 3.8 3.6  CL 106 111  CO2 28 25  BUN 20 13  CREATININE 1.09* 0.94  GLUCOSE 89 107*   No results for input(s): LABPT, INR in the last 72 hours.   Assessment/Plan:   MRI suggests right gluteal myositis, Possible small abscesses. Lab tests are not impressive for infection.    Procalcitonin level is < 0.1. WBC is normal without left shift. Sed rate is unremarkable. Right thigh with mild erythrema about the area about the right lateral inferior greater trochanter.  Plan: Will order aspiration of the right gluteal area abscess Vs reaction to local prolong heat and pressure. Okay to mobilize. Should  Check muscle enzymes for worsening or stable muscle injury. [place] E 04/06/2015, 8:15 AM

## 2015-04-06 NOTE — Progress Notes (Signed)
Patient Demographics  Jill Phillips, is a 59 y.o. female, DOB - 03/21/1956, [id number]  Admit date - 04/04/2015   Admitting Physician Jani Gravel, MD  Outpatient Primary MD for the patient is Osa Craver, MD  LOS - 1   Chief Complaint  Patient presents with  . Wound Infection       Admission HPI/Brief narrative: 59 yo female with hx of chronic pain syndrome, COPD, Tobacco use, presents with right hip pain. Patient with chronic lower back pain. Reports she was using heating pad on her lower back yesterday, slipped while using it, thinking it was under her buttocks area which was sleeping, she woke up she had right buttock /hip pain, as well she developed right hip bullae from the heating pad as well. A febrile, no leukocytosis, MRI with finding consistent with myositis, and questionable ill-defined abscess and gluteus maximus area.  Subjective:   Jill Phillips today has, No headache, No chest pain, No abdominal pain - No Nausea, No new weakness tingling or numbness, No Cough - SOB.   Assessment & Plan    Active Problems:   Buttock pain   Anemia   Renal insufficiency   Skin ulcer   right hip/buttocks pain  - Orthopedic consult greatly appreciated wide differentials , discussed with Dr. [name], MRI findings most likely related to muscle injury/myositis from heating pad/and direct pressure phenomena from sitting with a heating pad for few hours over the weekend , differentials include local compartment syndrome especially with elevated CK level , will continue with IV fluids (currently total CK significantly trending down), infectious etiology is less likely in absence of leukocytosis and fever and low pro-calcitonin level ,  Actos post aspiration of fluid collection 8/4 by IR ,to continue with IV antibiotics empirically pending aspirate result.- Apply bacitracin to skin ulcer, consult wound  care, possible burn injury from heat pads  Acute kidney injury  - Renal function back to baseline , possible rhabdomyolysis in the setting of significantly elevated total CK  - Continue with IV fluids    anemia  - Initial hemoglobin is 8.3, repeat is 11.2 , continue to monitor ,  patient with low vitamin B-12 level, currently on IM supplement .   COPD - Opinion with home medication, no active wheezing, on when necessary meds   Code Status: Full  Family Communication; Daughter at bedside   Disposition Plan: Home when stable   Procedures  None   Consults   Orthopedic   Medications  Scheduled Meds: . amLODipine  10 mg Oral Daily  . bacitracin   Topical BID  . benazepril  40 mg Oral Daily  . enoxaparin (LOVENOX) injection  40 mg Subcutaneous Daily  . fentaNYL  50 mcg Transdermal Q72H  . gabapentin  300 mg Oral QHS  . hydrochlorothiazide  12.5 mg Oral Daily  . levofloxacin (LEVAQUIN) IV  500 mg Intravenous Q24H  . levothyroxine  50 mcg Oral QAC breakfast  . mupirocin ointment   Topical BID  . nicotine  14 mg Transdermal Daily  . pantoprazole  40 mg Oral Daily  . pramipexole  0.125 mg Oral QHS  . pravastatin  40 mg Oral Daily  . sodium chloride  3 mL Intravenous Q12H  . tiotropium  18 mcg Inhalation Daily  . valACYclovir  500 mg Oral Daily  . vancomycin  750 mg Intravenous BID  . venlafaxine XR  150 mg Oral Daily   Continuous Infusions: . sodium chloride     PRN Meds:.acetaminophen **OR** acetaminophen, albuterol, carisoprodol, clonazePAM, diphenhydrAMINE, fluticasone, HYDROcodone-acetaminophen, naproxen, ondansetron (ZOFRAN) IV, promethazine, QUEtiapine  DVT Prophylaxis  Lovenox -  Lab Results  Component Value Date   PLT 183 04/06/2015    Antibiotics    Anti-infectives    Start     Dose/Rate Route Frequency Ordered Stop   04/06/15 2200  vancomycin (VANCOCIN) IVPB 750 mg/150 ml premix  Status:  Discontinued     750 mg 150 mL/hr over 60 Minutes  Intravenous Every 12 hours 04/06/15 1240 04/06/15 1242   04/06/15 1400  vancomycin (VANCOCIN) IVPB 750 mg/150 ml premix     750 mg 150 mL/hr over 60 Minutes Intravenous 2 times daily 04/06/15 1242     04/05/15 1200  vancomycin (VANCOCIN) 500 mg in sodium chloride 0.9 % 100 mL IVPB  Status:  Discontinued     500 mg 100 mL/hr over 60 Minutes Intravenous Every 12 hours 04/05/15 0024 04/06/15 1240   04/05/15 1000  valACYclovir (VALTREX) tablet 500 mg     500 mg Oral Daily 04/05/15 0248     04/05/15 0400  levofloxacin (LEVAQUIN) IVPB 500 mg     500 mg 100 mL/hr over 60 Minutes Intravenous Every 24 hours 04/05/15 0248     04/05/15 0030  vancomycin (VANCOCIN) IVPB 1000 mg/200 mL premix     1,000 mg 200 mL/hr over 60 Minutes Intravenous  Once 04/05/15 0024 04/05/15 0153          Objective:   Filed Vitals:   04/05/15 2350 04/06/15 0419 04/06/15 0828 04/06/15 1355  BP: 104/62 124/67  97/60  Pulse: 81 74  84  Temp: 99.3 F (37.4 C) 99.6 F (37.6 C)  99 F (37.2 C)  TempSrc: Oral Oral    Resp: 18 16  16   Height: 5\' 2"  (1.575 m)     Weight: 78 kg (171 lb 15.3 oz)     SpO2: 94% 91% 91% 95%    Wt Readings from Last 3 Encounters:  04/05/15 78 kg (171 lb 15.3 oz)  04/04/15 66.225 kg (146 lb)  02/15/15 67.677 kg (149 lb 3.2 oz)     Intake/Output Summary (Last 24 hours) at 04/06/15 1730 Last data filed at 04/06/15 1300  Gross per 24 hour  Intake   1480 ml  Output   1435 ml  Net     45 ml     Physical Exam  Awake Alert, Oriented X 3, No new F.N deficits, Normal affect [place].AT,PERRAL Supple Neck,No JVD, No cervical lymphadenopathy appriciated.  Symmetrical Chest wall movement, Good air movement bilaterally, CTAB RRR,No Gallops,Rubs or new Murmurs, No Parasternal Heave +ve B.Sounds, Abd Soft, No tenderness, No organomegaly appriciated, No rebound - guarding or rigidity. No Cyanosis, right buttock area erythema resolved, with skin ulceration in the lateral side, no drainage or  bleed.   Data Review   Micro Results Recent Results (from the past 240 hour(s))  Blood culture (routine x 2)     Status: None (Preliminary result)   Collection Time: 04/04/15 10:18 PM  Result Value Ref Range Status   Specimen Description BLOOD LAC  Final   Special Requests BOTTLES DRAWN AEROBIC AND ANAEROBIC 5CC  Final   Culture   Final    NO GROWTH 1 DAY Performed  at [facility]    Report Status PENDING  Incomplete  Blood culture (routine x 2)     Status: None (Preliminary result)   Collection Time: 04/04/15 10:23 PM  Result Value Ref Range Status   Specimen Description BLOOD RAC  Final   Special Requests BOTTLES DRAWN AEROBIC AND ANAEROBIC 5CC  Final   Culture   Final    NO GROWTH 1 DAY Performed at [facility]    Report Status PENDING  Incomplete    Radiology Reports Ct Hip Right W Wo Contrast  04/05/2015   CLINICAL DATA:  Evaluate for abscess.  EXAM: CT OF THE RIGHT HIP WITHOUT AND WITH CONTRAST  TECHNIQUE: Multidetector CT imaging was performed following the standard protocol before and after bolus administration of intravenous contrast.  CONTRAST:  146mL OMNIPAQUE IOHEXOL 300 MG/ML  SOLN  COMPARISON:  MRI 04/04/2015  FINDINGS: There is mild metal artifact from the right hip arthroplasty hardware. The bones appear intact. There is no bony destruction. There is no bone lesion. There is no fracture. Mildly altered attenuation within the gluteal musculature is consistent with edema associated with myositis. No soft tissue gas is evident. No drainable fluid collection is evident although there is image degradation due to the metal hardware.  IMPRESSION: No significant bony abnormality. Altered attenuation of the soft tissues suggests myositis. A drainable collection is not evident but there is mild metal degradation from the arthroplasty hardware.   Electronically Signed   By: Andreas Newport M.D.   On: 04/05/2015 02:18   Mr Lumbar Spine W Wo Contrast  03/31/2015    CLINICAL DATA:  Worsening back pain extending to the hips and groin, several months duration. Multiple previous lumbar surgeries. No known injury. Most recent surgery 2015.  EXAM: MRI LUMBAR SPINE WITHOUT AND WITH CONTRAST  TECHNIQUE: Multiplanar and multiecho pulse sequences of the lumbar spine were obtained without and with intravenous contrast.  CONTRAST:  91mL MULTIHANCE GADOBENATE DIMEGLUMINE 529 MG/ML IV SOLN  COMPARISON:  Myelography 07/06/2014.  MRI 03/25/2014.  FINDINGS: T11-12: Previous discectomy and fusion with pedicle screws. Fusion is probably solid. Sufficient patency of the canal and foramina.  T12-L1: Mild bulging of the disc. Mild facet hypertrophy. No canal or foraminal stenosis.  L1-2: Chronic degenerative disc disease with disc degeneration near complete loss of disc height. Previous decompressive surgery on the right which appears to be a partial hemilaminectomy and foramina ME. Endplate osteophytes and bulging of the disc. The central canal is sufficiently patent. There is some epidural fibrosis along the right side. There continues to be foraminal narrowing on the right that could affect the right L1 nerve root. No compressive foraminal stenosis on the left.  L2-3: Disc degeneration with endplate osteophytes and shallow protrusion of disc material. Bilateral facet and ligamentous hypertrophy. Mild stenosis of both lateral recesses without gross neural compression.  L3-4: Previous discectomy and fusion. Wide patency of the canal and foramina. No complicating features.  L4 to sacrum: Previous discectomy and fusion. Wide patency of the canal and foramina. 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  IMPRESSION: Since the previous study, the patient has undergone right-sided surgery at L1-2. There is mild epidural fibrosis along the right side of the thecal sac. There are endplate osteophytes in there is bulging of the disc, more towards the right. There is  some continued narrowing of the foramen on the right that could possibly continue to affect the right L1 nerve root. Definite neural compression  is not established.  Satisfactory appearance at the fusion levels of T11-12, L3-4 and L4 to sacrum.  Moderate degenerative changes at L2-3 but without evidence of focal neural compression.   Electronically Signed   By: Nelson Chimes M.D.   On: 03/31/2015 07:39   Mr Hip Right W Wo Contrast  04/05/2015   CLINICAL DATA:  Aching and throbbing wound over the right hip for approximately 5 days. History of right hip replacement 06/11/2009. Initial encounter.  EXAM: MRI OF THE RIGHT HIP WITHOUT AND WITH CONTRAST  TECHNIQUE: Multiplanar, multisequence MR imaging was performed both before and after administration of intravenous contrast.  CONTRAST:  13 mL MULTIHANCE GADOBENATE DIMEGLUMINE 529 MG/ML IV SOLN  COMPARISON:  None.  FINDINGS: Bones: Artifact from right hip replacement is noted. Bone marrow signal is normal. There is no evidence of osteomyelitis. No fracture is identified. No avascular necrosis of the left femoral head is seen. Postoperative change of lower lumbar fusion is noted.  Articular cartilage and labrum  Articular cartilage: Mild hyaline cartilage loss is seen about the left hip.  Labrum:  Appears intact.  Joint or bursal effusion  Joint effusion: Although evaluation is somewhat limited due to artifact, no hip joint effusion is seen on the right or left.  Bursae:  Unremarkable.  Muscles and tendons  Muscles and tendons: Intact. There is extensive edema with postcontrast enhancement in the gluteus maximus on the right. An ill defined fluid collection within the lateral aspect of the gluteus maximus consistent with abscess or early phlegmon measures 2.9 cm AP x 4.8 cm AP x 5.5 cm craniocaudal. Edema and enhancement are also seen in the gluteus medius and minimus on the right consistent with myositis. No other abscess is identified. Two foci of edema and enhancement  are seen in the left gluteal musculature. In the gluteus minimus, area of involvement measures 1.8 cm in diameter. In the gluteus medius on the left, area of involvement measures approximately 2.9 cm craniocaudal by 1.7 cm AP by 1.1 cm transverse.  Other findings  Miscellaneous:  Imaged intrapelvic contents are unremarkable.  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 No evidence of osteomyelitis or septic joint is identified.  Status post right hip replacement.  Small foci of soft tissue edema and enhancement in the left gluteus and medius muscles are most worrisome for focal myositis. No abscess is identified.   Electronically Signed   By: Inge Rise M.D.   On: 04/05/2015 07:18   US Aspiration  04/06/2015   CLINICAL DATA:  PRIOR RIGHT HIP ARTHROPLASTY. MR AND CT IMAGING DEMONSTRATES RIGHT HIP REGION MYOSITIS AND SMALL SURROUNDING GLUTEAL/RIGHT HIP FLUID COLLECTION  EXAM: ULTRASOUND GUIDED NEEDLE ASPIRATE BIOPSY OF RIGHT HIP JOINT  MEDICATIONS: 50 mcg IV Fentanyl  PROCEDURE: The procedure, risks, benefits, and alternatives were explained to the patient. Questions regarding the procedure were encouraged and answered. The patient understands and consents to the procedure.  The right posterior lateral hip region was prepped with ChloraPrep in a sterile fashion, and a sterile drape was applied covering the operative field. A sterile gown and sterile gloves were used for the procedure. Local anesthesia was provided with 1% Lidocaine.  Previous imaging reviewed. Patient position right decubitus. Preliminary ultrasound performed of the right hip gluteal region. Irregular hypoechoic area with a small amount of deep fluid noted about the right hip joint.  Under sterile conditions and local anesthesia, a 17 gauge 11.8  cm access needle was advanced percutaneously into the collection. Needle aspiration yielded 5 cc thin  serosanguineous fluid. No exudative component. Sample sent for Gram stain and culture.  COMPLICATIONS: None.  FINDINGS: Ultrasound imaging confirms needle access of the right gluteal/Hip joint small fluid collection for needle aspiration  IMPRESSION: Successful ultrasound right gluteal/ hip joint needle aspiration. Gram stain culture pending.   Electronically Signed   By: Jerilynn Mages.  Shick M.D.   On: 04/06/2015 11:34     CBC  Recent Labs Lab 04/04/15 2223 04/05/15 0310 04/06/15 0429  WBC 10.5 7.5 5.4  HGB 8.3* 11.2* 10.2*  HCT 26.4* 35.5* 33.1*  PLT 282 231 183  MCV 97.1 99.4 96.5  MCH 30.5 31.4 29.7  MCHC 31.4 31.5 30.8  RDW 13.3 13.4 13.0  LYMPHSABS 3.0  --   --   MONOABS 0.7  --   --   EOSABS 0.2  --   --   BASOSABS 0.0  --   --     Chemistries   Recent Labs Lab 04/04/15 2223 04/05/15 0310 04/06/15 0429  NA 142 140 143  K 4.1 3.8 3.6  CL 108 106 111  CO2 27 28 25   GLUCOSE 92 89 107*  BUN 22* 20 13  CREATININE 1.22* 1.09* 0.94  CALCIUM 8.6* 8.3* 8.3*  AST 145* 126* 66*  ALT 52 46 34  ALKPHOS 134* 120 104  BILITOT 0.3 0.6 0.4   ------------------------------------------------------------------------------------------------------------------ estimated creatinine clearance is 61.6 mL/min (by C-G formula based on Cr of 0.94). ------------------------------------------------------------------------------------------------------------------ No results for input(s): HGBA1C in the last 72 hours. ------------------------------------------------------------------------------------------------------------------ No results for input(s): CHOL, HDL, LDLCALC, TRIG, CHOLHDL, LDLDIRECT in the last 72 hours. ------------------------------------------------------------------------------------------------------------------ No results for input(s): TSH, T4TOTAL, T3FREE, THYROIDAB in the last 72 hours.  Invalid input(s):  FREET3 ------------------------------------------------------------------------------------------------------------------  Recent Labs  04/05/15 0310  VITAMINB12 244  FERRITIN 38    Coagulation profile No results for input(s): INR, PROTIME in the last 168 hours.  No results for input(s): DDIMER in the last 72 hours.  Cardiac Enzymes No results for input(s): CKMB, TROPONINI, MYOGLOBIN in the last 168 hours.  Invalid input(s): CK ------------------------------------------------------------------------------------------------------------------ Invalid input(s): POCBNP     Time Spent in minutes   30 minutes   ELGERGAWY, DAWOOD M.D on 04/06/2015 at 5:30 PM  Between 7am to 7pm - Pager - [phone]  After 7pm go to [email or web address] - password [facility]  [facility]   Office  [phone]

## 2015-04-06 NOTE — Procedures (Signed)
Successful rt gluteal / hip jt aspiration No comp Stable 5cc thin clear fluid aspirated Gs/cx sent Full report in pacs

## 2015-04-07 DIAGNOSIS — L98499 Non-pressure chronic ulcer of skin of other sites with unspecified severity: Secondary | ICD-10-CM

## 2015-04-07 LAB — BASIC METABOLIC PANEL
Anion gap: 6 (ref 5–15)
BUN: 12 mg/dL (ref 6–20)
CO2: 27 mmol/L (ref 22–32)
Calcium: 8.3 mg/dL — ABNORMAL LOW (ref 8.9–10.3)
Chloride: 110 mmol/L (ref 101–111)
Creatinine, Ser: 1.04 mg/dL — ABNORMAL HIGH (ref 0.44–1.00)
GFR calc Af Amer: >60 mL/min (ref >60)
GFR calc non Af Amer: 57 mL/min — ABNORMAL LOW (ref >60)
Glucose, Bld: 90 mg/dL (ref 65–99)
Potassium: 3.7 mmol/L (ref 3.5–5.1)
Sodium: 143 mmol/L (ref 135–145)

## 2015-04-07 LAB — CBC
HCT: 30.4 % — ABNORMAL LOW (ref 36.0–46.0)
Hemoglobin: 9.9 g/dL — ABNORMAL LOW (ref 12.0–15.0)
MCH: 31.4 pg (ref 26.0–34.0)
MCHC: 32.6 g/dL (ref 30.0–36.0)
MCV: 96.5 fL (ref 78.0–100.0)
Platelets: 179 10*3/uL (ref 150–400)
RBC: 3.15 MIL/uL — ABNORMAL LOW (ref 3.87–5.11)
RDW: 13.1 % (ref 11.5–15.5)
WBC: 5.1 10*3/uL (ref 4.0–10.5)

## 2015-04-07 LAB — CK: Total CK: 2308 U/L — ABNORMAL HIGH (ref 38–234)

## 2015-04-07 LAB — FOLATE RBC
Folate, Hemolysate: 422 ng/mL
Folate, RBC: 1252 ng/mL (ref >498)
Hematocrit: 33.7 % — ABNORMAL LOW (ref 34.0–46.6)

## 2015-04-07 LAB — PROCALCITONIN: Procalcitonin: <0.1 ng/mL

## 2015-04-07 MED ORDER — BACITRACIN ZINC 500 UNIT/GM EX OINT: TOPICAL_OINTMENT | Freq: Two times a day (BID) | CUTANEOUS | Status: DC

## 2015-04-07 MED ORDER — LACTINEX PO CHEW: 1.0000 | CHEWABLE_TABLET | Freq: Three times a day (TID) | ORAL | Status: DC

## 2015-04-07 MED ORDER — SODIUM CHLORIDE 0.9 % IV SOLN
INTRAVENOUS | Status: DC
  Administered 2015-04-07: 09:00:00 via INTRAVENOUS

## 2015-04-07 MED ORDER — MUPIROCIN 2 % EX OINT: TOPICAL_OINTMENT | Freq: Two times a day (BID) | CUTANEOUS | Status: DC

## 2015-04-07 MED ORDER — CYANOCOBALAMIN 250 MCG PO TABS: 250.0000 ug | ORAL_TABLET | Freq: Every day | ORAL | Status: DC

## 2015-04-07 MED ORDER — DOXYCYCLINE HYCLATE 50 MG PO CAPS
100.0000 mg | ORAL_CAPSULE | Freq: Two times a day (BID) | ORAL | Status: AC
Start: 2015-04-07 — End: 2015-04-13

## 2015-04-07 NOTE — Progress Notes (Signed)
RN reviewed discharge instructions with patient. RN reviewed hand washing, dressing changes, antibiotic regimen, and smoking cessation. All questions answered.   Paperwork and prescriptions given.   RN rolled patient down in wheelchair with all belongings.

## 2015-04-07 NOTE — Care Management Important Message (Signed)
Important Message  Patient Details  Name: Jill Phillips MRN: [id number] Date of Birth: 1956/01/22   Medicare Important Message Given:  Yes-second notification given    Camillo Flaming 04/07/2015, 12:38 [place] Message  Patient Details  Name: Jill Phillips MRN: [id number] Date of Birth: 1956/05/03   Medicare Important Message Given:  Yes-second notification given    Camillo Flaming 04/07/2015, 12:37 PM

## 2015-04-07 NOTE — Care Management Note (Signed)
Case Management Note  Patient Details  Name: Jill Phillips MRN: [id number] Date of Birth: 1955/09/24  Subjective/Objective:         Admitted witth right gluteal myositis           Action/Plan: Discharge planning, Recieved a referral to arrange [facility] RN for wound care. Spoke with patient at bedside, no preference for [facility] agency. Contacted [facility] to arrange.  Expected Discharge Date:   (unknown)               Expected Discharge Plan:  [place]  In-House Referral:     Discharge planning Services  CM Consult  Post Acute Care Choice:  Home Health Choice offered to:     DME Arranged:    DME Agency:     HH Arranged:  RN [place] Agency:  [place]  Status of Service:  Completed, signed off  Medicare Important Message Given:    Date Medicare IM Given:    Medicare IM give by:    Date Additional Medicare IM Given:    Additional Medicare Important Message give by:     If discussed at [place] of Stay Meetings, dates discussed:    Additional Comments:  Guadalupe Maple, RN 04/07/2015, 11:00 AM

## 2015-04-07 NOTE — Progress Notes (Signed)
     Subjective: I hope I can go home today. Awake, alert and oriented x 4. Pain is improved. Patient reports pain as mild.    Objective:   VITALS:  Temp:  [98.6 F (37 C)-99.2 F (37.3 C)] 98.6 F (37 C) (08/05 0416) Pulse Rate:  [73-86] 73 (08/05 0416) Resp:  [16] 16 (08/05 0416) BP: (97-102)/(59-61) 101/61 mmHg (08/05 0416) SpO2:  [91 %-95 %] 93 % (08/05 0416) Weight:  [71.5 kg (157 lb 10.1 oz)] 71.5 kg (157 lb 10.1 oz) (08/04 1700)  Neurologically intact ABD soft Neurovascular intact Sensation intact distally Intact pulses distally Dorsiflexion/Plantar flexion intact Incision: scant drainage Would with no worsening of appearance, thin exudate. Unable to locate gram stain or any sign of culture of fluid from yesterday's aspiration.   LABS  Recent Labs  04/04/15 2223 04/05/15 0310 04/06/15 0429 04/07/15 0450  HGB 8.3* 11.2* 10.2* 9.9*  WBC 10.5 7.5 5.4 5.1  PLT 282 231 183 179    Recent Labs  04/06/15 0429 04/07/15 0450  NA 143 143  K 3.6 3.7  CL 111 110  CO2 25 27  BUN 13 12  CREATININE 0.94 1.04*  GLUCOSE 107* 90   No results for input(s): LABPT, INR in the last 72 hours.   Assessment/Plan: Cellulitis right lateral trochanter. Anemia unknown cause, ? BM suppression vs occult bleeding? Advance diet Continue ABX therapy due to Culture taken of fluid collection site right lateral trochanteric  NITKA,JAMES E 04/07/2015, 8:27 AM

## 2015-04-07 NOTE — Discharge Instructions (Signed)
May bath and wash area over right hip with warm soapy water, dry, apply mupirocin oint 2% to the area of the wound and apply bandaid.  Dress the wound  Twice a day including the post shower and dressing. Call if fever greater than 101.5, any worsening drainage or change in odor or swelling. Do not use a heating pad.

## 2015-04-07 NOTE — Progress Notes (Signed)
Cultures and gram stain so far negative okay for discharge and follow up in my office in 6 days.

## 2015-04-07 NOTE — Discharge Summary (Signed)
Jill Phillips, is a 59 y.o. female  DOB May 09, 1956  MRN [id number].  Admission date:  04/04/2015  Admitting Physician  Jani Gravel, MD  Discharge Date:  04/07/2015   Primary MD  Osa Craver, MD  Recommendations for primary care physician for things to follow:  - Check CBC, BMP during next visit. - Patient with B 12 low borderline, started on B-12 supplement, please recheck level in 6 weeks. - Patient will need anemia workup as an outpatient.   Admission Diagnosis  Right hip pain [M25.551]   Discharge Diagnosis  Right hip pain [M25.551]    Active Problems:   Buttock pain   Anemia   Renal insufficiency   Skin ulcer      Past Medical History  Diagnosis Date  . Postlaminectomy syndrome, thoracic region   . Osteoarthrosis, unspecified whether generalized or localized, lower leg   . Dysthymic disorder   . Calcifying tendinitis of shoulder   . Pain in joint, upper arm   . Chronic pain syndrome   . Lumbago   . Primary localized osteoarthrosis, lower leg   . Hypertension   . Hyperlipidemia   . GERD (gastroesophageal reflux disease)   . Thyroid disease   . Restless leg syndrome   . PONV (postoperative nausea and vomiting)   . Heart murmur   . Sleep apnea     s/p surgery- last sleep study 2011- doesnt use oxygen or machine at night as instructed  . History of blood transfusion 1980  . Hypothyroidism   . Active smoker   . COPD (chronic obstructive pulmonary disease)     on nocturnal home o2    Past Surgical History  Procedure Laterality Date  . Appendectomy    . Abdominal hysterectomy    . Tubal ligation    . Spine surgery      thoracic x 1,  lumbar x 15  . Right hip replacement    . Knee surgeries r knee      arthroscopy- right  . Hammer toe surgery    . Sleep apnea surgery    . Cholecystectomy    . Joint replacement    . Total knee arthroplasty  05/29/2012    Procedure: TOTAL  KNEE ARTHROPLASTY;  Surgeon: Mcarthur Rossetti, MD;  Location: [facility];  Service: Orthopedics;  Laterality: Right;  Right Total Knee Arthroplasty  . Back surgery      16 back surgeries  . Lumbar laminectomy/decompression microdiscectomy N/A 01/28/2014    Procedure: Minimally Invasive Right  L1-2 Microdiscectomy;  Surgeon: [name], MD;  Location: [facility];  Service: Orthopedics;  Laterality: N/A;       History of present illness and  Hospital Course:     Kindly see H&P for history of present illness and admission details, please review complete Labs, Consult reports and Test reports for all details in brief  HPI  from the history and physical done on the day of admission 04/05/2015 HPI: 59 yo female with hx of chronic pain syndrome,  Copd, Tobacco use, Apparently c/o right hip pain. Pt wonders if using the heating pad might have led to burn ? Which caused skin ulcer. Pt is a poor historian and is not clear on the details of what led to the skin ulcer in this area. She thinks might have started as a blister that popped on Saturday nite. Pt denies fever, chills, cp, palp, sob, lower ext edema. Pt will be admitted for w/up of skin ulcer, R/o hematoma vs infection/ abscess.    Hospital Course  59 yo female with hx of chronic pain syndrome, COPD, Tobacco use, presents with right hip pain. Patient with chronic lower back pain. Reports she was using heating pad on her lower back yesterday, slipped while using it, thinking it was under her buttocks area which was sleeping, she woke up she had right buttock /hip pain, as well she developed right hip bullae from the heating pad as well. A febrile, no leukocytosis, MRI with finding consistent with myositis, and questionable ill-defined abscess and gluteus maximus area.  right hip/buttocks pain  - Orthopedic consult greatly appreciated, wide differentials , discussed with Dr. [name],  - MRI findings most likely related to muscle  injury/myositis from heating pad/and direct pressure phenomena from sitting with a heating pad for few hours over the weekend , differentials include local compartment syndrome especially with elevated CK level , currently total CK significantly trending down,  - Infectious etiology is unlikely in absence of leukocytosis and fever and low pro-calcitonin level , kept empirically on IV vancomycin and levofloxacin during hospital stay,post aspiration of fluid collection 8/4 by IR , initial evaluation of fluid collection showing no organism seen, patient will be discharged on oral doxycycline until she seen by Dr. [name] next week, plan was discussed with orthopedic. - Apply bacitracin to skin ulcer, continue with wound care  Acute kidney injury  - Renal function back to baseline , possible rhabdomyolysis in the setting of significantly elevated total CK  - Resolved  anemia  - Initial hemoglobin is 8.3, repeat is 11.2  , patient with low vitamin B-12 level, received 2 doses of IM B-12, will discharge on oral supplement. - Anemia workup as an outpatient  COPD - no active wheezing .     Discharge Condition:  Stable   Follow UP  Follow-up Information    Follow up with NITKA,JAMES E, MD In 6 days.   Specialty:  Orthopedic Surgery   Why:  For wound re-check   Contact information:   [address] [place] [address] [phone]       Follow up with Osa Craver, MD. Call in 1 week.   Specialty:  Internal Medicine   Why:  Posthospitalization follow-up   Contact information:   [address] [address] [phone]         Discharge Instructions  and  Discharge Medications    Discharge Instructions    Diet - low sodium heart healthy    Complete by:  As directed      Discharge instructions    Complete by:  As directed   Follow with Primary MD Osa Craver, MD in 7 days   Get CBC, CMP, by Primary MD next visit.    Activity: As tolerated with  Full fall precautions use walker/cane & assistance as needed   Disposition Home **   Diet: Heart Healthy ** , with feeding assistance and aspiration precautions.  For Heart failure patients - Check your Weight same time  everyday, if you gain over 2 pounds, or you develop in leg swelling, experience more shortness of breath or chest pain, call your Primary MD immediately. Follow Cardiac Low Salt Diet and 1.5 lit/day fluid restriction.   On your next visit with your primary care physician please Get Medicines reviewed and adjusted.   Please request your Prim.MD to go over all Hospital Tests and Procedure/Radiological results at the follow up, please get all Hospital records sent to your Prim MD by signing hospital release before you go home.   If you experience worsening of your admission symptoms, develop shortness of breath, life threatening emergency, suicidal or homicidal thoughts you must seek medical attention immediately by calling 911 or calling your MD immediately  if symptoms less severe.  You Must read complete instructions/literature along with all the possible adverse reactions/side effects for all the Medicines you take and that have been prescribed to you. Take any new Medicines after you have completely understood and accpet all the possible adverse reactions/side effects.   Do not drive, operating heavy machinery, perform activities at heights, swimming or participation in water activities or provide baby sitting services if your were admitted for syncope or siezures until you have seen by Primary MD or a Neurologist and advised to do so again.  Do not drive when taking Pain medications.    Do not take more than prescribed Pain, Sleep and Anxiety Medications  Special Instructions: If you have smoked or chewed Tobacco  in the last 2 yrs please stop smoking, stop any regular Alcohol  and or any Recreational drug use.  Wear Seat belts while driving.   Please note  You  were cared for by a hospitalist during your hospital stay. If you have any questions about your discharge medications or the care you received while you were in the hospital after you are discharged, you can call the unit and asked to speak with the hospitalist on call if the hospitalist that took care of you is not available. Once you are discharged, your primary care physician will handle any further medical issues. Please note that NO REFILLS for any discharge medications will be authorized once you are discharged, as it is imperative that you return to your primary care physician (or establish a relationship with a primary care physician if you do not have one) for your aftercare needs so that they can reassess your need for medications and monitor your lab values.     Discharge wound care:    Complete by:  As directed   May bath and wash area over right hip with warm soapy water, dry, apply mupirocin oint 2% to the area of the wound and apply bandaid.  Dress the wound  Twice a day including the post shower and dressing. Call if fever greater than 101.5, any worsening drainage or change in odor or swelling. Do not use a heating pad.     Increase activity slowly    Complete by:  As directed             Medication List    STOP taking these medications        ciprofloxacin 250 MG tablet  Commonly known as:  CIPRO      TAKE these medications        acetaminophen 500 MG tablet  Commonly known as:  TYLENOL  Take 1,000 mg by mouth every 6 (six) hours as needed for moderate pain.     albuterol 108 (90 BASE) MCG/ACT  inhaler  Commonly known as:  PROVENTIL HFA;VENTOLIN HFA  Inhale 1 puff into the lungs every 6 (six) hours as needed for wheezing or shortness of breath.     ALPRAZolam 1 MG tablet  Commonly known as:  XANAX  Take 1 tablet by mouth daily as needed for anxiety.     amLODipine 10 MG tablet  Commonly known as:  NORVASC  TAKE 1 TABLET (10 MG TOTAL) BY MOUTH DAILY.      bacitracin ointment  Apply topically 2 (two) times daily.     benazepril 40 MG tablet  Commonly known as:  LOTENSIN  TAKE 1 TABLET BY MOUTH DAILY.     carisoprodol 350 MG tablet  Commonly known as:  SOMA  TAKE 1 TABLET BY MOUTH EVERY 8 HOURS AS NEEDED.     clonazePAM 2 MG tablet  Commonly known as:  KLONOPIN  TAKE 1 TABLET BY MOUTH TWICE DAILY AS NEEDED     doxycycline 50 MG capsule  Commonly known as:  VIBRAMYCIN  Take 2 capsules (100 mg total) by mouth 2 (two) times daily. Take for 1 week.     fentaNYL 50 MCG/HR  Commonly known as:  DURAGESIC  Place 1 patch (50 mcg total) onto the skin every 3 (three) days.     fluticasone 50 MCG/ACT nasal spray  Commonly known as:  FLONASE  Place 2 sprays into both nostrils daily.     gabapentin 300 MG capsule  Commonly known as:  NEURONTIN  Take 1 capsule (300 mg total) by mouth at bedtime.     hydrochlorothiazide 12.5 MG tablet  Commonly known as:  HYDRODIURIL  Take 1 tablet by mouth daily.     HYDROcodone-acetaminophen 10-325 MG per tablet  Commonly known as:  NORCO  Take 1 tablet by mouth every 8 (eight) hours as needed for severe pain.     lactobacillus acidophilus & bulgar chewable tablet  Chew 1 tablet by mouth 3 (three) times daily with meals.     levothyroxine 50 MCG tablet  Commonly known as:  SYNTHROID, LEVOTHROID  Take 1 tablet (50 mcg total) by mouth daily before breakfast.     mupirocin ointment 2 %  Commonly known as:  BACTROBAN  Apply topically 2 (two) times daily. Apply to the right lateral thigh open wound twice a day with a q-tip.     naproxen 500 MG tablet  Commonly known as:  NAPROSYN  Take 1 tablet (500 mg total) by mouth at bedtime as needed for moderate pain.     NATURAL FIBER LAXATIVE PO  Take 2 tablets by mouth daily as needed (constipation).     nicotine 14 mg/24hr patch  Commonly known as:  NICODERM CQ - dosed in mg/24 hours  Place 1 patch (14 mg total) onto the skin daily.     omeprazole 20  MG capsule  Commonly known as:  PRILOSEC  TAKE 1 CAPSULE BY MOUTH 2 TIMES DAILY BEFORE A MEAL.     pramipexole 0.125 MG tablet  Commonly known as:  MIRAPEX  0.125 mg q hst for three days, then bid thereafter.     pravastatin 40 MG tablet  Commonly known as:  PRAVACHOL  TAKE 1 TABLET BY MOUTH DAILY.     promethazine 25 MG tablet  Commonly known as:  PHENERGAN  Take 1 tablet by mouth every 6 (six) hours as needed for nausea or vomiting.     QUEtiapine 50 MG tablet  Commonly known as:  SEROQUEL  Take  50-100 mg by mouth at bedtime as needed (sleep).     tiotropium 18 MCG inhalation capsule  Commonly known as:  SPIRIVA  Place 1 capsule (18 mcg total) into inhaler and inhale daily.     valACYclovir 500 MG tablet  Commonly known as:  VALTREX  TAKE 1 TABLET BY MOUTH DAILY.     venlafaxine XR 150 MG 24 hr capsule  Commonly known as:  EFFEXOR-XR  Take 150 mg by mouth daily.     vitamin B-12 250 MCG tablet  Commonly known as:  CYANOCOBALAMIN  Take 1 tablet (250 mcg total) by mouth daily.     VOLTAREN 1 % Gel  Generic drug:  diclofenac sodium  APPLY 2 GRAMS TO AFFECTED AREA 3 TIMES DAILY AS NEEDED FOR PAIN.          Diet and Activity recommendation: See Discharge Instructions above   Consults obtained - Orthopedic   Major procedures and Radiology Reports - PLEASE review detailed and final reports for all details, in brief -   rt gluteal / hip jt aspiration   Ct Hip Right W Wo Contrast  04/05/2015   CLINICAL DATA:  Evaluate for abscess.  EXAM: CT OF THE RIGHT HIP WITHOUT AND WITH CONTRAST  TECHNIQUE: Multidetector CT imaging was performed following the standard protocol before and after bolus administration of intravenous contrast.  CONTRAST:  181mL OMNIPAQUE IOHEXOL 300 MG/ML  SOLN  COMPARISON:  MRI 04/04/2015  FINDINGS: There is mild metal artifact from the right hip arthroplasty hardware. The bones appear intact. There is no bony destruction. There is no bone lesion.  There is no fracture. Mildly altered attenuation within the gluteal musculature is consistent with edema associated with myositis. No soft tissue gas is evident. No drainable fluid collection is evident although there is image degradation due to the metal hardware.  IMPRESSION: No significant bony abnormality. Altered attenuation of the soft tissues suggests myositis. A drainable collection is not evident but there is mild metal degradation from the arthroplasty hardware.   Electronically Signed   By: Andreas Newport M.D.   On: 04/05/2015 02:18   Mr Lumbar Spine W Wo Contrast  03/31/2015   CLINICAL DATA:  Worsening back pain extending to the hips and groin, several months duration. Multiple previous lumbar surgeries. No known injury. Most recent surgery 2015.  EXAM: MRI LUMBAR SPINE WITHOUT AND WITH CONTRAST  TECHNIQUE: Multiplanar and multiecho pulse sequences of the lumbar spine were obtained without and with intravenous contrast.  CONTRAST:  107mL MULTIHANCE GADOBENATE DIMEGLUMINE 529 MG/ML IV SOLN  COMPARISON:  Myelography 07/06/2014.  MRI 03/25/2014.  FINDINGS: T11-12: Previous discectomy and fusion with pedicle screws. Fusion is probably solid. Sufficient patency of the canal and foramina.  T12-L1: Mild bulging of the disc. Mild facet hypertrophy. No canal or foraminal stenosis.  L1-2: Chronic degenerative disc disease with disc degeneration near complete loss of disc height. Previous decompressive surgery on the right which appears to be a partial hemilaminectomy and foramina ME. Endplate osteophytes and bulging of the disc. The central canal is sufficiently patent. There is some epidural fibrosis along the right side. There continues to be foraminal narrowing on the right that could affect the right L1 nerve root. No compressive foraminal stenosis on the left.  L2-3: Disc degeneration with endplate osteophytes and shallow protrusion of disc material. Bilateral facet and ligamentous hypertrophy. Mild  stenosis of both lateral recesses without gross neural compression.  L3-4: Previous discectomy and fusion. Wide patency of the canal and  foramina. No complicating features.  L4 to sacrum: Previous discectomy and fusion. Wide patency of the canal and foramina. 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  IMPRESSION: Since the previous study, the patient has undergone right-sided surgery at L1-2. There is mild epidural fibrosis along the right side of the thecal sac. There are endplate osteophytes in there is bulging of the disc, more towards the right. There is some continued narrowing of the foramen on the right that could possibly continue to affect the right L1 nerve root. Definite neural compression is not established.  Satisfactory appearance at the fusion levels of T11-12, L3-4 and L4 to sacrum.  Moderate degenerative changes at L2-3 but without evidence of focal neural compression.   Electronically Signed   By: Nelson Chimes M.D.   On: 03/31/2015 07:39   Mr Hip Right W Wo Contrast  04/05/2015   CLINICAL DATA:  Aching and throbbing wound over the right hip for approximately 5 days. History of right hip replacement 06/11/2009. Initial encounter.  EXAM: MRI OF THE RIGHT HIP WITHOUT AND WITH CONTRAST  TECHNIQUE: Multiplanar, multisequence MR imaging was performed both before and after administration of intravenous contrast.  CONTRAST:  13 mL MULTIHANCE GADOBENATE DIMEGLUMINE 529 MG/ML IV SOLN  COMPARISON:  None.  FINDINGS: Bones: Artifact from right hip replacement is noted. Bone marrow signal is normal. There is no evidence of osteomyelitis. No fracture is identified. No avascular necrosis of the left femoral head is seen. Postoperative change of lower lumbar fusion is noted.  Articular cartilage and labrum  Articular cartilage: Mild hyaline cartilage loss is seen about the left hip.  Labrum:  Appears intact.  Joint or bursal effusion  Joint effusion: Although evaluation is  somewhat limited due to artifact, no hip joint effusion is seen on the right or left.  Bursae:  Unremarkable.  Muscles and tendons  Muscles and tendons: Intact. There is extensive edema with postcontrast enhancement in the gluteus maximus on the right. An ill defined fluid collection within the lateral aspect of the gluteus maximus consistent with abscess or early phlegmon measures 2.9 cm AP x 4.8 cm AP x 5.5 cm craniocaudal. Edema and enhancement are also seen in the gluteus medius and minimus on the right consistent with myositis. No other abscess is identified. Two foci of edema and enhancement are seen in the left gluteal musculature. In the gluteus minimus, area of involvement measures 1.8 cm in diameter. In the gluteus medius on the left, area of involvement measures approximately 2.9 cm craniocaudal by 1.7 cm AP by 1.1 cm transverse.  Other findings  Miscellaneous:  Imaged intrapelvic contents are unremarkable.  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 No evidence of osteomyelitis or septic joint is identified.  Status post right hip replacement.  Small foci of soft tissue edema and enhancement in the left gluteus and medius muscles are most worrisome for focal myositis. No abscess is identified.   Electronically Signed   By: Inge Rise M.D.   On: 04/05/2015 07:18   US Aspiration  04/06/2015   CLINICAL DATA:  PRIOR RIGHT HIP ARTHROPLASTY. MR AND CT IMAGING DEMONSTRATES RIGHT HIP REGION MYOSITIS AND SMALL SURROUNDING GLUTEAL/RIGHT HIP FLUID COLLECTION  EXAM: ULTRASOUND GUIDED NEEDLE ASPIRATE BIOPSY OF RIGHT HIP JOINT  MEDICATIONS: 50 mcg IV Fentanyl  PROCEDURE: The procedure, risks, benefits, and alternatives were explained to the patient. Questions regarding the procedure were encouraged  and answered. The patient understands and consents to the procedure.  The right posterior lateral hip region was  prepped with ChloraPrep in a sterile fashion, and a sterile drape was applied covering the operative field. A sterile gown and sterile gloves were used for the procedure. Local anesthesia was provided with 1% Lidocaine.  Previous imaging reviewed. Patient position right decubitus. Preliminary ultrasound performed of the right hip gluteal region. Irregular hypoechoic area with a small amount of deep fluid noted about the right hip joint.  Under sterile conditions and local anesthesia, a 17 gauge 11.8 cm access needle was advanced percutaneously into the collection. Needle aspiration yielded 5 cc thin serosanguineous fluid. No exudative component. Sample sent for Gram stain and culture.  COMPLICATIONS: None.  FINDINGS: Ultrasound imaging confirms needle access of the right gluteal/Hip joint small fluid collection for needle aspiration  IMPRESSION: Successful ultrasound right gluteal/ hip joint needle aspiration. Gram stain culture pending.   Electronically Signed   By: Jerilynn Mages.  Shick M.D.   On: 04/06/2015 11:34    Micro Results     Recent Results (from the past 240 hour(s))  Blood culture (routine x 2)     Status: None (Preliminary result)   Collection Time: 04/04/15 10:18 PM  Result Value Ref Range Status   Specimen Description BLOOD LAC  Final   Special Requests BOTTLES DRAWN AEROBIC AND ANAEROBIC 5CC  Final   Culture   Final    NO GROWTH 1 DAY Performed at [facility]    Report Status PENDING  Incomplete  Blood culture (routine x 2)     Status: None (Preliminary result)   Collection Time: 04/04/15 10:23 PM  Result Value Ref Range Status   Specimen Description BLOOD RAC  Final   Special Requests BOTTLES DRAWN AEROBIC AND ANAEROBIC 5CC  Final   Culture   Final    NO GROWTH 1 DAY Performed at [facility]    Report Status PENDING  Incomplete  Culture, routine-abscess     Status: None (Preliminary result)   Collection Time: 04/06/15 11:16 AM  Result Value Ref Range Status    Specimen Description ABSCESS RIGHT HIP  Final   Special Requests Normal  Final   Gram Stain   Final    NO WBC SEEN NO SQUAMOUS EPITHELIAL CELLS SEEN NO ORGANISMS SEEN Performed at [facility]    Culture   Final    NO GROWTH 1 DAY Performed at [facility]    Report Status PENDING  Incomplete       Today   Subjective:   Jill Phillips today has no headache,no chest abdominal pain,no new weakness tingling or numbness, feels much better  today.   Objective:   Blood pressure 101/61, pulse 73, temperature 98.6 F (37 C), temperature source Oral, resp. rate 16, height 5\' 2"  (1.575 m), weight 71.5 kg (157 lb 10.1 oz), SpO2 92 %.   Intake/Output Summary (Last 24 hours) at 04/07/15 1021 Last data filed at 04/07/15 0900  Gross per 24 hour  Intake   1845 ml  Output      0 ml  Net   1845 ml    Exam Awake Alert, Oriented X 3, No new F.N deficits, Normal affect [place].AT,PERRAL Supple Neck,No JVD, No cervical lymphadenopathy appriciated.  Symmetrical Chest wall movement, Good air movement bilaterally, CTAB RRR,No Gallops,Rubs or new Murmurs, No Parasternal Heave +ve B.Sounds, Abd Soft, No tenderness, No organomegaly appriciated, No rebound - guarding or rigidity. No Cyanosis, right  buttock area erythema resolved, with skin ulceration in the lateral side, no drainage or bleed.  Data Review   CBC w Diff: Lab Results  Component Value Date   WBC 5.1 04/07/2015   WBC 7.6 10/10/2013   HGB 9.9* 04/07/2015   HGB 12.0* 10/10/2013   HCT 30.4* 04/07/2015   HCT 39.8 10/10/2013   PLT 179 04/07/2015   LYMPHOPCT 29 04/04/2015   MONOPCT 6 04/04/2015   EOSPCT 2 04/04/2015   BASOPCT 0 04/04/2015    CMP: Lab Results  Component Value Date   NA 143 04/07/2015   K 3.7 04/07/2015   CL 110 04/07/2015   CO2 27 04/07/2015   BUN 12 04/07/2015   CREATININE 1.04* 04/07/2015   CREATININE 1.13* 02/15/2015   PROT 6.0* 04/06/2015   ALBUMIN 3.1* 04/06/2015   BILITOT 0.4  04/06/2015   ALKPHOS 104 04/06/2015   AST 66* 04/06/2015   ALT 34 04/06/2015  .   Total Time in preparing paper work, data evaluation and todays exam - 35 minutes  Keely Drennan M.D on 04/07/2015 at 10:21 AM  [facility]   Office  [phone]

## 2015-04-09 DIAGNOSIS — J449 Chronic obstructive pulmonary disease, unspecified: Secondary | ICD-10-CM | POA: Diagnosis not present

## 2015-04-09 DIAGNOSIS — G4730 Sleep apnea, unspecified: Secondary | ICD-10-CM | POA: Diagnosis not present

## 2015-04-09 DIAGNOSIS — N289 Disorder of kidney and ureter, unspecified: Secondary | ICD-10-CM | POA: Diagnosis not present

## 2015-04-09 DIAGNOSIS — E785 Hyperlipidemia, unspecified: Secondary | ICD-10-CM | POA: Diagnosis not present

## 2015-04-09 DIAGNOSIS — R238 Other skin changes: Secondary | ICD-10-CM | POA: Diagnosis not present

## 2015-04-09 DIAGNOSIS — K589 Irritable bowel syndrome without diarrhea: Secondary | ICD-10-CM | POA: Diagnosis not present

## 2015-04-09 DIAGNOSIS — M609 Myositis, unspecified: Secondary | ICD-10-CM | POA: Diagnosis not present

## 2015-04-09 DIAGNOSIS — M25551 Pain in right hip: Secondary | ICD-10-CM | POA: Diagnosis not present

## 2015-04-09 DIAGNOSIS — D649 Anemia, unspecified: Secondary | ICD-10-CM | POA: Diagnosis not present

## 2015-04-09 DIAGNOSIS — K219 Gastro-esophageal reflux disease without esophagitis: Secondary | ICD-10-CM | POA: Diagnosis not present

## 2015-04-09 DIAGNOSIS — I10 Essential (primary) hypertension: Secondary | ICD-10-CM | POA: Diagnosis not present

## 2015-04-09 DIAGNOSIS — E039 Hypothyroidism, unspecified: Secondary | ICD-10-CM | POA: Diagnosis not present

## 2015-04-09 DIAGNOSIS — F341 Dysthymic disorder: Secondary | ICD-10-CM | POA: Diagnosis not present

## 2015-04-09 DIAGNOSIS — Z4800 Encounter for change or removal of nonsurgical wound dressing: Secondary | ICD-10-CM | POA: Diagnosis not present

## 2015-04-09 LAB — CULTURE, ROUTINE-ABSCESS
Culture: NO GROWTH
Gram Stain: NONE SEEN
Special Requests: NORMAL

## 2015-04-10 ENCOUNTER — Telehealth: Payer: Self-pay | Admitting: *Deleted

## 2015-04-10 LAB — CULTURE, BLOOD (ROUTINE X 2)
Culture: NO GROWTH
Culture: NO GROWTH
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
## 2015-04-11 ENCOUNTER — Ambulatory Visit: Payer: Medicare Other | Admitting: Registered Nurse

## 2015-04-11 ENCOUNTER — Encounter: Payer: Medicare Other | Admitting: Registered Nurse

## 2015-04-11 DIAGNOSIS — M25552 Pain in left hip: Secondary | ICD-10-CM | POA: Diagnosis not present

## 2015-04-11 DIAGNOSIS — T24211D Burn of second degree of right thigh, subsequent encounter: Secondary | ICD-10-CM | POA: Diagnosis not present

## 2015-04-11 DIAGNOSIS — M25551 Pain in right hip: Secondary | ICD-10-CM | POA: Diagnosis not present

## 2015-04-11 LAB — ANAEROBIC CULTURE: Special Requests: NORMAL

## 2015-04-12 ENCOUNTER — Ambulatory Visit: Payer: Medicare Other | Admitting: Internal Medicine

## 2015-04-17 DIAGNOSIS — G4734 Idiopathic sleep related nonobstructive alveolar hypoventilation: Secondary | ICD-10-CM | POA: Diagnosis not present

## 2015-04-20 ENCOUNTER — Encounter (HOSPITAL_COMMUNITY): Payer: Self-pay

## 2015-04-20 ENCOUNTER — Emergency Department (HOSPITAL_COMMUNITY)
Admission: EM | Admit: 2015-04-20 | Discharge: 2015-04-20 | Disposition: A | Discharge status: Home / Self Care | Payer: Medicare Other | Attending: Emergency Medicine | Admitting: Emergency Medicine

## 2015-04-20 DIAGNOSIS — Z720 Tobacco use: Secondary | ICD-10-CM | POA: Insufficient documentation

## 2015-04-20 DIAGNOSIS — M179 Osteoarthritis of knee, unspecified: Secondary | ICD-10-CM | POA: Insufficient documentation

## 2015-04-20 DIAGNOSIS — Z8659 Personal history of other mental and behavioral disorders: Secondary | ICD-10-CM | POA: Insufficient documentation

## 2015-04-20 DIAGNOSIS — R011 Cardiac murmur, unspecified: Secondary | ICD-10-CM | POA: Insufficient documentation

## 2015-04-20 DIAGNOSIS — Z880 Allergy status to penicillin: Secondary | ICD-10-CM | POA: Insufficient documentation

## 2015-04-20 DIAGNOSIS — R6883 Chills (without fever): Secondary | ICD-10-CM | POA: Diagnosis not present

## 2015-04-20 DIAGNOSIS — K219 Gastro-esophageal reflux disease without esophagitis: Secondary | ICD-10-CM | POA: Diagnosis not present

## 2015-04-20 DIAGNOSIS — G894 Chronic pain syndrome: Secondary | ICD-10-CM | POA: Insufficient documentation

## 2015-04-20 DIAGNOSIS — Z79899 Other long term (current) drug therapy: Secondary | ICD-10-CM | POA: Diagnosis not present

## 2015-04-20 DIAGNOSIS — Z5189 Encounter for other specified aftercare: Secondary | ICD-10-CM

## 2015-04-20 DIAGNOSIS — E785 Hyperlipidemia, unspecified: Secondary | ICD-10-CM | POA: Diagnosis not present

## 2015-04-20 DIAGNOSIS — Z4801 Encounter for change or removal of surgical wound dressing: Secondary | ICD-10-CM | POA: Insufficient documentation

## 2015-04-20 DIAGNOSIS — J449 Chronic obstructive pulmonary disease, unspecified: Secondary | ICD-10-CM | POA: Insufficient documentation

## 2015-04-20 DIAGNOSIS — S71101A Unspecified open wound, right thigh, initial encounter: Secondary | ICD-10-CM | POA: Diagnosis not present

## 2015-04-20 DIAGNOSIS — Z4889 Encounter for other specified surgical aftercare: Secondary | ICD-10-CM | POA: Diagnosis not present

## 2015-04-20 DIAGNOSIS — Z792 Long term (current) use of antibiotics: Secondary | ICD-10-CM | POA: Insufficient documentation

## 2015-04-20 DIAGNOSIS — E039 Hypothyroidism, unspecified: Secondary | ICD-10-CM | POA: Insufficient documentation

## 2015-04-20 DIAGNOSIS — M791 Myalgia: Secondary | ICD-10-CM | POA: Diagnosis not present

## 2015-04-20 DIAGNOSIS — I10 Essential (primary) hypertension: Secondary | ICD-10-CM | POA: Insufficient documentation

## 2015-04-20 MED ORDER — HYDROCODONE-ACETAMINOPHEN 5-325 MG PO TABS
1.0000 | ORAL_TABLET | Freq: Once | ORAL | Status: AC
  Administered 2015-04-20: 1 via ORAL
  Filled 2015-04-20: qty 1

## 2015-04-20 NOTE — ED Notes (Signed)
Per EMS, Pt, from home, presents for a wound check of a R hip abscess.  Pain score 7/10.  Pt reports yellow discharge from the site.  Pt was discharged on 8/5 after being admitted, due to the abscess.  Pt reports taking all medications as prescribed.

## 2015-04-20 NOTE — Discharge Instructions (Signed)
Wound Check Return for fever, drainage from the wound, or surrounding redness. Your wound appears healthy today. Your wound will heal gradually over time. Eventually a scar will form that will fade with time. FACTORS THAT AFFECT SCAR FORMATION:  People differ in the severity in which they scar.  Scar severity varies according to location, size, and the traits you inherited from your parents (genetic predisposition).  Irritation to the wound from infection, rubbing, or chemical exposure will increase the amount of scar formation. HOME CARE INSTRUCTIONS   If you were given a dressing, you should change it at least once a day or as instructed by your caregiver. If the bandage sticks, soak it off with a solution of hydrogen peroxide.  If the bandage becomes wet, dirty, or develops a bad smell, change it as soon as possible.  Look for signs of infection.  Only take over-the-counter or prescription medicines for pain, discomfort, or fever as directed by your caregiver. SEEK IMMEDIATE MEDICAL CARE IF:   You have redness, swelling, or increasing pain in the wound.  You notice pus coming from the wound.  You have a fever.  You notice a bad smell coming from the wound or dressing. Document Released: 05/25/2004 Document Revised: 11/11/2011 Document Reviewed: 08/19/2005 [facility] Patient Information 2015 [place], [place]. This information is not intended to replace advice given to you by your health care provider. Make sure you discuss any questions you have with your health care provider.

## 2015-04-20 NOTE — ED Provider Notes (Signed)
CSN: [id number]     Arrival date & time 04/20/15  1255 History  This chart was scribed for non-physician practitioner, Ottie Glazier, PA-C working with Lacretia [name], MD by Rayna Sexton, ED scribe. This patient was seen in room WTR5/WTR5 and the patient's care was started at 1:16 PM.   Chief Complaint  Patient presents with  . Wound Check   The history is provided by the patient. No language interpreter was used.    HPI Comments: Jill Phillips is a 59 y.o. female who presents to the Emergency Department for a wound check to her right hip with onset 15 days ago. Pt was seen on 04/05/2015 and dx with right gluteal myositis and an intramuscular abscess which she had incised. She notes still having moderate pain to the affected region, intermittent chills as well as mild intermittent drainage from the wound and further notes finishing her prescribed antibiotics. Pt notes taking her prescribed hydrocodone as needed for pain management which provides adequate relief. Pt confirms a follow up appointment with her provider on 05/06/2015. Pt has a shx of right hip replacement 2 years ago, right total knee replacement 3 years ago and lower back surgery 1 year ago. She denies any fevers.   Past Medical History  Diagnosis Date  . Postlaminectomy syndrome, thoracic region   . Osteoarthrosis, unspecified whether generalized or localized, lower leg   . Dysthymic disorder   . Calcifying tendinitis of shoulder   . Pain in joint, upper arm   . Chronic pain syndrome   . Lumbago   . Primary localized osteoarthrosis, lower leg   . Hypertension   . Hyperlipidemia   . GERD (gastroesophageal reflux disease)   . Thyroid disease   . Restless leg syndrome   . PONV (postoperative nausea and vomiting)   . Heart murmur   . Sleep apnea     s/p surgery- last sleep study 2011- doesnt use oxygen or machine at night as instructed  . History of blood transfusion 1980  . Hypothyroidism   . Active smoker   . COPD  (chronic obstructive pulmonary disease)     on nocturnal home o2   Past Surgical History  Procedure Laterality Date  . Appendectomy    . Abdominal hysterectomy    . Tubal ligation    . Spine surgery      thoracic x 1,  lumbar x 15  . Right hip replacement    . Knee surgeries r knee      arthroscopy- right  . Hammer toe surgery    . Sleep apnea surgery    . Cholecystectomy    . Joint replacement    . Total knee arthroplasty  05/29/2012    Procedure: TOTAL KNEE ARTHROPLASTY;  Surgeon: Mcarthur Rossetti, MD;  Location: [facility];  Service: Orthopedics;  Laterality: Right;  Right Total Knee Arthroplasty  . Back surgery      16 back surgeries  . Lumbar laminectomy/decompression microdiscectomy N/A 01/28/2014    Procedure: Minimally Invasive Right  L1-2 Microdiscectomy;  Surgeon: [name], MD;  Location: [facility];  Service: Orthopedics;  Laterality: N/A;   Family History  Problem Relation Age of Onset  . Kidney disease Mother   . Heart disease Father   . Anuerysm Brother 29    brain  . Heart disease Brother   . Heart disease Sister 35    s/p CABG  . Hypertension Sister    Social History  Substance Use Topics  . Smoking  status: Current Every Day Smoker -- 1.50 packs/day for 45 years    Types: Cigarettes  . Smokeless tobacco: Never Used     Comment: trying to quit  . Alcohol Use: No   OB History    No data available     Review of Systems  Constitutional: Positive for chills. Negative for fever.  Musculoskeletal: Positive for myalgias.  Skin: Positive for wound. Negative for color change.   Allergies  Amoxicillin; Chlorzoxazone; Codeine; Darvocet; Dilaudid; Flagyl; Keflex; Morphine and related; Nitrofurantoin monohyd macro; Percocet; and Sulfa antibiotics  Home Medications   Prior to Admission medications   Medication Sig Start Date End Date Taking? Authorizing Provider  acetaminophen (TYLENOL) 500 MG tablet Take 1,000 mg by mouth every 6 (six) hours as needed for  moderate pain.     Historical Provider, MD  albuterol (PROVENTIL HFA;VENTOLIN HFA) 108 (90 BASE) MCG/ACT inhaler Inhale 1 puff into the lungs every 6 (six) hours as needed for wheezing or shortness of breath. 12/21/14   Chesley Mires, MD  ALPRAZolam Duanne Moron) 1 MG tablet Take 1 tablet by mouth daily as needed for anxiety.  05/27/13   Historical Provider, MD  amLODipine (NORVASC) 10 MG tablet TAKE 1 TABLET (10 MG TOTAL) BY MOUTH DAILY. 08/11/14   Alexa Sherral Hammers, MD  bacitracin ointment Apply topically 2 (two) times daily. 04/07/15   Silver Huguenin Elgergawy, MD  benazepril (LOTENSIN) 40 MG tablet TAKE 1 TABLET BY MOUTH DAILY. 08/29/14   Alexa Sherral Hammers, MD  carisoprodol (SOMA) 350 MG tablet TAKE 1 TABLET BY MOUTH EVERY 8 HOURS AS NEEDED. 03/30/15   Meredith Staggers, MD  clonazePAM (KLONOPIN) 2 MG tablet TAKE 1 TABLET BY MOUTH TWICE DAILY AS NEEDED 03/24/15   Meredith Staggers, MD  fentaNYL (DURAGESIC) 50 MCG/HR Place 1 patch (50 mcg total) onto the skin every 3 (three) days. 03/17/15   Bayard Hugger, NP  fluticasone (FLONASE) 50 MCG/ACT nasal spray Place 2 sprays into both nostrils daily. Patient taking differently: Place 2 sprays into both nostrils daily as needed for allergies.  05/05/14 05/05/15  Alexa Sherral Hammers, MD  gabapentin (NEURONTIN) 300 MG capsule Take 1 capsule (300 mg total) by mouth at bedtime. 01/18/15   Meredith Staggers, MD  hydrochlorothiazide (HYDRODIURIL) 12.5 MG tablet Take 1 tablet by mouth daily. 03/09/14   Historical Provider, MD  HYDROcodone-acetaminophen (NORCO) 10-325 MG per tablet Take 1 tablet by mouth every 8 (eight) hours as needed for severe pain. 03/17/15   Bayard Hugger, NP  lactobacillus acidophilus & bulgar (LACTINEX) chewable tablet Chew 1 tablet by mouth 3 (three) times daily with meals. 04/07/15   Albertine Patricia, MD  levothyroxine (SYNTHROID, LEVOTHROID) 50 MCG tablet Take 1 tablet (50 mcg total) by mouth daily before breakfast. 02/15/15   Alexa Sherral Hammers, MD  mupirocin  ointment (BACTROBAN) 2 % Apply topically 2 (two) times daily. Apply to the right lateral thigh open wound twice a day with a q-tip. 04/07/15   [name], MD  naproxen (NAPROSYN) 500 MG tablet Take 1 tablet (500 mg total) by mouth at bedtime as needed for moderate pain. 02/22/15   Alexa Sherral Hammers, MD  nicotine (NICODERM CQ - DOSED IN MG/24 HOURS) 14 mg/24hr patch Place 1 patch (14 mg total) onto the skin daily. 11/29/14   Alexa Sherral Hammers, MD  omeprazole (PRILOSEC) 20 MG capsule TAKE 1 CAPSULE BY MOUTH 2 TIMES DAILY BEFORE A MEAL. 08/02/14   Alexa Sherral Hammers, MD  pramipexole (  MIRAPEX) 0.125 MG tablet 0.125 mg q hst for three days, then bid thereafter. Patient taking differently: Take 0.125 mg by mouth at bedtime.  03/09/15   Meredith Staggers, MD  pravastatin (PRAVACHOL) 40 MG tablet TAKE 1 TABLET BY MOUTH DAILY. 01/09/15   Alexa Sherral Hammers, MD  promethazine (PHENERGAN) 25 MG tablet Take 1 tablet by mouth every 6 (six) hours as needed for nausea or vomiting.  02/18/14   Historical Provider, MD  Psyllium (NATURAL FIBER LAXATIVE PO) Take 2 tablets by mouth daily as needed (constipation).    Historical Provider, MD  QUEtiapine (SEROQUEL) 50 MG tablet Take 50-100 mg by mouth at bedtime as needed (sleep).  08/05/14   Historical Provider, MD  tiotropium (SPIRIVA) 18 MCG inhalation capsule Place 1 capsule (18 mcg total) into inhaler and inhale daily. 12/21/14   Chesley Mires, MD  valACYclovir (VALTREX) 500 MG tablet TAKE 1 TABLET BY MOUTH DAILY. 10/27/14   Alexa Sherral Hammers, MD  venlafaxine XR (EFFEXOR-XR) 150 MG 24 hr capsule Take 150 mg by mouth daily. 05/02/14   Historical Provider, MD  vitamin B-12 (CYANOCOBALAMIN) 250 MCG tablet Take 1 tablet (250 mcg total) by mouth daily. 04/07/15   Silver Huguenin Elgergawy, MD  VOLTAREN 1 % GEL APPLY 2 GRAMS TO AFFECTED AREA 3 TIMES DAILY AS NEEDED FOR PAIN. 01/31/15   Meredith Staggers, MD   BP 126/66 mmHg  Pulse 84  Temp(Src) 98.3 F (36.8 C) (Oral)  Resp 17  SpO2  96% Physical Exam  Constitutional: She is oriented to person, place, and time. She appears well-developed and well-nourished. No distress.  HENT:  Head: Normocephalic and atraumatic.  Mouth/Throat: No oropharyngeal exudate.  Eyes: Right eye exhibits no discharge. Left eye exhibits no discharge.  Neck: Normal range of motion. No tracheal deviation present.  Cardiovascular: Normal rate.   Pulmonary/Chest: Effort normal. No respiratory distress.  Abdominal: Soft. There is no tenderness.  Musculoskeletal: Normal range of motion.  Neurological: She is alert and oriented to person, place, and time.  Skin: Skin is warm and dry. She is not diaphoretic.  2x1 cm healing right hip healing wound with no purulent drainage or surrounding erythema; covered with Band-Aid. Ambulatory with steady gait.    Psychiatric: She has a normal mood and affect. Her behavior is normal.  Nursing note and vitals reviewed.  ED Course  Procedures  DIAGNOSTIC STUDIES: Oxygen Saturation is 94% on RA, normal by my interpretation.    COORDINATION OF CARE: 1:20 PM Discussed treatment plan with pt at bedside including a dosage of hydrocodone and pt agreed to plan.  Labs Review Labs Reviewed - No data to display  Imaging Review No results found. I have personally reviewed and evaluated these images and lab results as part of my medical decision-making.   EKG Interpretation None      MDM   Final diagnoses:  Visit for wound check  She presents for wound check after surgical debridement for myositis/hip infection.  She is well appearing and in no acute distress.  Afebril.  Wound is healing well and she has follow up with orthopedics in 2 weeks.  I discussed return precautions and she verbally agrees with the plan.  Medications  HYDROcodone-acetaminophen (NORCO/VICODIN) 5-325 MG per tablet 1 tablet (1 tablet Oral Given 04/20/15 1353)  I personally performed the services described in this documentation, which was  scribed in my presence. The recorded information has been reviewed and is accurate.    Ottie Glazier, PA-C 04/20/15 1528  Lacretia [name], MD 04/22/15 [phone]

## 2015-04-27 ENCOUNTER — Other Ambulatory Visit: Payer: Self-pay | Admitting: Physical Medicine & Rehabilitation

## 2015-05-02 DIAGNOSIS — L03115 Cellulitis of right lower limb: Secondary | ICD-10-CM | POA: Diagnosis not present

## 2015-05-02 DIAGNOSIS — T24211D Burn of second degree of right thigh, subsequent encounter: Secondary | ICD-10-CM | POA: Diagnosis not present

## 2015-05-10 DIAGNOSIS — L03115 Cellulitis of right lower limb: Secondary | ICD-10-CM | POA: Diagnosis not present

## 2015-05-11 DIAGNOSIS — H01111 Allergic dermatitis of right upper eyelid: Secondary | ICD-10-CM | POA: Diagnosis not present

## 2015-05-18 DIAGNOSIS — G4734 Idiopathic sleep related nonobstructive alveolar hypoventilation: Secondary | ICD-10-CM | POA: Diagnosis not present

## 2015-05-18 DIAGNOSIS — M25551 Pain in right hip: Secondary | ICD-10-CM | POA: Diagnosis not present

## 2015-05-22 ENCOUNTER — Ambulatory Visit (INDEPENDENT_AMBULATORY_CARE_PROVIDER_SITE_OTHER): Payer: Medicare Other | Admitting: Internal Medicine

## 2015-05-22 ENCOUNTER — Encounter: Payer: Self-pay | Admitting: Internal Medicine

## 2015-05-22 VITALS — BP 119/75 | HR 94 | Temp 98.0°F | Ht 62.0 in | Wt 153.9 lb

## 2015-05-22 DIAGNOSIS — R109 Unspecified abdominal pain: Secondary | ICD-10-CM | POA: Insufficient documentation

## 2015-05-22 DIAGNOSIS — Z23 Encounter for immunization: Secondary | ICD-10-CM | POA: Diagnosis not present

## 2015-05-22 DIAGNOSIS — E039 Hypothyroidism, unspecified: Secondary | ICD-10-CM

## 2015-05-22 DIAGNOSIS — L98499 Non-pressure chronic ulcer of skin of other sites with unspecified severity: Secondary | ICD-10-CM

## 2015-05-22 DIAGNOSIS — E038 Other specified hypothyroidism: Secondary | ICD-10-CM

## 2015-05-22 DIAGNOSIS — Z0000 Encounter for general adult medical examination without abnormal findings: Secondary | ICD-10-CM

## 2015-05-22 DIAGNOSIS — F32A Depression, unspecified: Secondary | ICD-10-CM

## 2015-05-22 DIAGNOSIS — R1031 Right lower quadrant pain: Secondary | ICD-10-CM | POA: Diagnosis not present

## 2015-05-22 DIAGNOSIS — F329 Major depressive disorder, single episode, unspecified: Secondary | ICD-10-CM

## 2015-05-22 DIAGNOSIS — I10 Essential (primary) hypertension: Secondary | ICD-10-CM

## 2015-05-22 MED ORDER — VENLAFAXINE HCL ER 150 MG PO CP24: 300.0000 mg | ORAL_CAPSULE | Freq: Every day | ORAL | Status: DC

## 2015-05-22 MED ORDER — ALPRAZOLAM 1 MG PO TABS: 1.0000 mg | ORAL_TABLET | Freq: Two times a day (BID) | ORAL | Status: DC | PRN

## 2015-05-22 MED ORDER — SENNOSIDES-DOCUSATE SODIUM 8.6-50 MG PO TABS: 2.0000 | ORAL_TABLET | Freq: Every evening | ORAL | Status: DC | PRN

## 2015-05-22 NOTE — Assessment & Plan Note (Signed)
Patient was recently hospitalized for right hip myositis and abscess after leaving a heating pad on for too long. CK was elevated at that time but trended down on discharge. Area is well healed on physical exam.   Plan: -Recheck CK and CMET

## 2015-05-22 NOTE — Progress Notes (Signed)
Internal Medicine Clinic Attending  Case discussed with Dr. Richardson at the time of the visit.  We reviewed the resident's history and exam and pertinent patient test results.  I agree with the assessment, diagnosis, and plan of care documented in the resident's note. 

## 2015-05-22 NOTE — Progress Notes (Signed)
Subjective:    Patient ID: Jill Phillips, female    DOB: 08/28/1956, 59 y.o.   MRN: [id number]  HPI Ms. Fatheree is a 59 yo female with PMHx of HTN, hypothyroidism, depression and chronic pain who presents for follow up for her hypothyroidism and right hip myositis. Please see problem based assessment and plan for more information.  Review of Systems General: Denies fever, chills, change in appetite.  Respiratory: Denies SOB, cough, DOE Cardiovascular: Denies chest pain and palpitations.  Gastrointestinal: Admits to right lower quadrant pain and constipation. Denies nausea, vomiting, diarrhea, blood in stool and abdominal distention.  Genitourinary: Denies dysuria, urgency, frequency, suprapubic pain and flank pain. Musculoskeletal: Admits to chronic back pain. Denies myalgias, joint swelling, arthralgias and gait problem.  Skin: Denies pallor, rash and wounds.  Neurological: Denies dizziness, headaches, weakness, lightheadedness  Past Medical History  Diagnosis Date  . Postlaminectomy syndrome, thoracic region   . Osteoarthrosis, unspecified whether generalized or localized, lower leg   . Dysthymic disorder   . Calcifying tendinitis of shoulder   . Pain in joint, upper arm   . Chronic pain syndrome   . Lumbago   . Primary localized osteoarthrosis, lower leg   . Hypertension   . Hyperlipidemia   . GERD (gastroesophageal reflux disease)   . Thyroid disease   . Restless leg syndrome   . PONV (postoperative nausea and vomiting)   . Heart murmur   . Sleep apnea     s/p surgery- last sleep study 2011- doesnt use oxygen or machine at night as instructed  . History of blood transfusion 1980  . Hypothyroidism   . Active smoker   . COPD (chronic obstructive pulmonary disease)     on nocturnal home o2   Outpatient Encounter Prescriptions as of 05/22/2015  Medication Sig  . acetaminophen (TYLENOL) 500 MG tablet Take 1,000 mg by mouth every 6 (six) hours as needed for moderate pain.     [name] albuterol (PROVENTIL HFA;VENTOLIN HFA) 108 (90 BASE) MCG/ACT inhaler Inhale 1 puff into the lungs every 6 (six) hours as needed for wheezing or shortness of breath.  . ALPRAZolam (XANAX) 1 MG tablet Take 1 tablet (1 mg total) by mouth 2 (two) times daily as needed for anxiety.  [name] amLODipine (NORVASC) 10 MG tablet TAKE 1 TABLET (10 MG TOTAL) BY MOUTH DAILY.  . bacitracin ointment Apply topically 2 (two) times daily.  . benazepril (LOTENSIN) 40 MG tablet TAKE 1 TABLET BY MOUTH DAILY.  . carisoprodol (SOMA) 350 MG tablet TAKE 1 TABLET BY MOUTH EVERY 8 HOURS AS NEEDED.  . clonazePAM (KLONOPIN) 2 MG tablet TAKE 1 TABLET BY MOUTH TWICE DAILY AS NEEDED  . fentaNYL (DURAGESIC) 50 MCG/HR Place 1 patch (50 mcg total) onto the skin every 3 (three) days.  . fluticasone (FLONASE) 50 MCG/ACT nasal spray Place 2 sprays into both nostrils daily. (Patient taking differently: Place 2 sprays into both nostrils daily as needed for allergies. )  . gabapentin (NEURONTIN) 300 MG capsule Take 1 capsule (300 mg total) by mouth at bedtime.  . gabapentin (NEURONTIN) 300 MG capsule Take 1 capsule (300 mg total) by mouth at bedtime.  . hydrochlorothiazide (HYDRODIURIL) 12.5 MG tablet Take 1 tablet by mouth daily.  [name] HYDROcodone-acetaminophen (NORCO) 10-325 MG per tablet Take 1 tablet by mouth every 8 (eight) hours as needed for severe pain.  [name] lactobacillus acidophilus & bulgar (LACTINEX) chewable tablet Chew 1 tablet by mouth 3 (three) times daily with meals.  [name] levothyroxine (  SYNTHROID, LEVOTHROID) 50 MCG tablet Take 1 tablet (50 mcg total) by mouth daily before breakfast.  . mupirocin ointment (BACTROBAN) 2 % Apply topically 2 (two) times daily. Apply to the right lateral thigh open wound twice a day with a q-tip.  . naproxen (NAPROSYN) 500 MG tablet Take 1 tablet (500 mg total) by mouth at bedtime as needed for moderate pain.  . nicotine (NICODERM CQ - DOSED IN MG/24 HOURS) 14 mg/24hr patch Place 1 patch (14 mg total)  onto the skin daily.  [name] omeprazole (PRILOSEC) 20 MG capsule TAKE 1 CAPSULE BY MOUTH 2 TIMES DAILY BEFORE A MEAL.  . pramipexole (MIRAPEX) 0.125 MG tablet 0.125 mg q hst for three days, then bid thereafter. (Patient taking differently: Take 0.125 mg by mouth at bedtime. )  . pravastatin (PRAVACHOL) 40 MG tablet TAKE 1 TABLET BY MOUTH DAILY.  [name] promethazine (PHENERGAN) 25 MG tablet Take 1 tablet by mouth every 6 (six) hours as needed for nausea or vomiting.   . Psyllium (NATURAL FIBER LAXATIVE PO) Take 2 tablets by mouth daily as needed (constipation).  . QUEtiapine (SEROQUEL) 50 MG tablet Take 50-100 mg by mouth at bedtime as needed (sleep).   . senna-docusate (SENOKOT-S) 8.6-50 MG per tablet Take 2 tablets by mouth at bedtime as needed for mild constipation.  [name] tiotropium (SPIRIVA) 18 MCG inhalation capsule Place 1 capsule (18 mcg total) into inhaler and inhale daily.  . valACYclovir (VALTREX) 500 MG tablet TAKE 1 TABLET BY MOUTH DAILY.  [name] venlafaxine XR (EFFEXOR-XR) 150 MG 24 hr capsule Take 2 capsules (300 mg total) by mouth daily with breakfast.  . vitamin B-12 (CYANOCOBALAMIN) 250 MCG tablet Take 1 tablet (250 mcg total) by mouth daily.  . VOLTAREN 1 % GEL APPLY 2 GRAMS TO AFFECTED AREA 3 TIMES DAILY AS NEEDED FOR PAIN.  . [DISCONTINUED] ALPRAZolam (XANAX) 1 MG tablet Take 1 tablet by mouth daily as needed for anxiety.   . [DISCONTINUED] venlafaxine XR (EFFEXOR-XR) 150 MG 24 hr capsule Take 150 mg by mouth daily.   No facility-administered encounter medications on file as of 05/22/2015.       Objective:   Physical Exam Filed Vitals:   05/22/15 1500  BP: 119/75  Pulse: 94  Temp: 98 F (36.7 C)  TempSrc: Oral  Height: 5\' 2"  (1.575 m)  Weight: 153 lb 14.4 oz (69.809 kg)  SpO2: 97%   General: Vital signs reviewed.  Patient appears older than stated age, in no acute distress and cooperative with exam.  Cardiovascular: RRR, S1 normal, S2 normal, no murmurs, gallops, or  rubs. Pulmonary/Chest: Clear to auscultation bilaterally, no wheezes, rales, or rhonchi. Abdominal: Soft, mildly tender to palpation in right lower quadrant, non-distended, hypoactive BS +, no masses, organomegaly, or guarding present.  Extremities: No lower extremity edema bilaterally, pulses symmetric and intact bilaterally. No cyanosis or clubbing. Skin: Warm, dry and intact. No rashes or erythema. Psychiatric: Normal mood and affect. speech and behavior is normal. Cognition and memory are normal.     Assessment & Plan:   Please see problem oriented assessment and plan.

## 2015-05-22 NOTE — Assessment & Plan Note (Signed)
Patient complains of RLQ pain for one week. Pain was initially intermittent, but has become more constant and severe in the last 2 days. She describes it as Personal assistant. Pain is normally a 5/10 and can get up to a 7/10. She has not noticed it is associated with anything. She does admit to increased constipation recently and denies any associated fever, chills, nausea, vomiting, decreased appetite, diarrhea or urinary symptoms. 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 DDx includes constipation versus referred pain from lumbar stenosis.   Plan: -Senokot-S 2 tabs BID -Plans for repeat surgery of L1-S1, continue current pain regimen -Follow up if pain does not improve with senokot-s, consider further imaging

## 2015-05-22 NOTE — Assessment & Plan Note (Signed)
Not able to have any referrals at this time.  Given stool cards in place of colonoscopy for now.

## 2015-05-22 NOTE — Assessment & Plan Note (Signed)
Patient's psychiatrist who previously prescribed her psychiatric medications no longer sees patients as outpatient. She requests refills on her medications until she can find a new provider.   Plan: -Refill Xanax 1 mg BID prn #60 no refills -Refill Effexor -Provided patient with information for [facility] as she cannot have a psych referral at this time

## 2015-05-22 NOTE — Assessment & Plan Note (Signed)
Will recheck TSH and Free T4 today. Continue levothyroxine 50 mcg daily.

## 2015-05-22 NOTE — Assessment & Plan Note (Signed)
BP Readings from Last 3 Encounters:  05/22/15 119/75  04/20/15 126/66  04/07/15 101/61    Lab Results  Component Value Date   NA 143 04/07/2015   K 3.7 04/07/2015   CREATININE 1.04* 04/07/2015    Assessment: Blood pressure control:   Well controlled Progress toward BP goal:   At goal Comments: Compliant with amlodipine 10 mg daily, HCTZ 12.5 mg daily,   Plan: Medications:  continue current medications Educational resources provided: brochure, handout, video

## 2015-05-22 NOTE — Patient Instructions (Signed)
TAKE SENOKOT-S FOR CONSTIPATION (2 PILLS AT NIGHT AS NEEDED)  IF ABDOMINAL PAIN WORSENS OR IF YOU DEVELOP NEW SYMPTOMS SUCH AS NAUSEA, VOMITING, DIARRHEA, FEVER, PLEASE CALL THE CLINIC AND LET us KNOW AS WE MAY NEED TO DO A CT SCAN.

## 2015-05-23 LAB — CMP14 + ANION GAP
ALT: 17 IU/L (ref 0–32)
AST: 21 IU/L (ref 0–40)
Albumin/Globulin Ratio: 1.6 (ref 1.1–2.5)
Albumin: 4.4 g/dL (ref 3.6–4.8)
Alkaline Phosphatase: 137 IU/L — ABNORMAL HIGH (ref 39–117)
Anion Gap: 19 mmol/L — ABNORMAL HIGH (ref 10.0–18.0)
BUN/Creatinine Ratio: 15 (ref 11–26)
BUN: 17 mg/dL (ref 8–27)
Bilirubin Total: <0.2 mg/dL (ref 0.0–1.2)
CO2: 27 mmol/L (ref 18–29)
Calcium: 8.9 mg/dL (ref 8.7–10.3)
Chloride: 102 mmol/L (ref 97–108)
Creatinine, Ser: 1.16 mg/dL — ABNORMAL HIGH (ref 0.57–1.00)
GFR calc Af Amer: 59 mL/min/{1.73_m2} — ABNORMAL LOW (ref >59)
GFR calc non Af Amer: 51 mL/min/{1.73_m2} — ABNORMAL LOW (ref >59)
Globulin, Total: 2.7 g/dL (ref 1.5–4.5)
Glucose: 78 mg/dL (ref 65–99)
Potassium: 4.8 mmol/L (ref 3.5–5.2)
Sodium: 148 mmol/L — ABNORMAL HIGH (ref 134–144)
Total Protein: 7.1 g/dL (ref 6.0–8.5)

## 2015-05-23 LAB — CBC
Hematocrit: 34.5 % (ref 34.0–46.6)
Hemoglobin: 11.4 g/dL (ref 11.1–15.9)
MCH: 30.6 pg (ref 26.6–33.0)
MCHC: 33 g/dL (ref 31.5–35.7)
MCV: 93 fL (ref 79–97)
Platelets: 257 10*3/uL (ref 150–379)
RBC: 3.72 x10E6/uL — ABNORMAL LOW (ref 3.77–5.28)
RDW: 14.1 % (ref 12.3–15.4)
WBC: 6.2 10*3/uL (ref 3.4–10.8)

## 2015-05-23 LAB — T4, FREE: Free T4: 0.91 ng/dL (ref 0.82–1.77)

## 2015-05-23 LAB — CK: Total CK: 183 U/L — ABNORMAL HIGH (ref 24–173)

## 2015-05-23 LAB — TSH: TSH: 5.21 u[IU]/mL — ABNORMAL HIGH (ref 0.450–4.500)

## 2015-05-25 ENCOUNTER — Other Ambulatory Visit: Payer: Self-pay | Admitting: Internal Medicine

## 2015-05-30 ENCOUNTER — Other Ambulatory Visit: Payer: Self-pay | Admitting: Physical Medicine & Rehabilitation

## 2015-05-30 ENCOUNTER — Telehealth: Payer: Self-pay | Admitting: *Deleted

## 2015-05-30 NOTE — Telephone Encounter (Signed)
Call from pt - requesting lab results from the 19th; states she does not have any energy and wants to be sure everything is all right. Thanks

## 2015-05-31 ENCOUNTER — Telehealth: Payer: Self-pay | Admitting: *Deleted

## 2015-05-31 NOTE — Telephone Encounter (Signed)
Please have her primary care MD respond to this. Her hemoglobin checked 9/9 was actually higher than previous up from 9 to 11 so her fatigue not related to progressive anemia

## 2015-05-31 NOTE — Telephone Encounter (Signed)
Jill Phillips called and she cannot get in for appt until 07/04/15.  She is asking if we can write for her something for constipation because she is experiencing extreme constipation. She is asking about Movantik.  She cannot take/tolerate the Linzess.  Would you like to prescribe?

## 2015-05-31 NOTE — Telephone Encounter (Signed)
Informed pt Hgb up to 11.4 from 9.9 and fatigue not related to progressive anemia per Dr Beryle Beams. She stated she had already talked to her PCP.

## 2015-05-31 NOTE — Telephone Encounter (Signed)
movantik 25mg  qd, #30, 2RF

## 2015-06-01 MED ORDER — NALOXEGOL OXALATE 25 MG PO TABS: 25.0000 mg | ORAL_TABLET | Freq: Every day | ORAL | Status: DC

## 2015-06-01 NOTE — Telephone Encounter (Addendum)
Order placed.  I notified Jill Phillips by voicemail and if she has problems with needing prior auth we have samples in the office. She is to let us know.

## 2015-06-02 ENCOUNTER — Encounter: Payer: Medicare Other | Admitting: Physical Medicine & Rehabilitation

## 2015-06-02 ENCOUNTER — Other Ambulatory Visit: Payer: Self-pay | Admitting: Physical Medicine & Rehabilitation

## 2015-06-08 ENCOUNTER — Telehealth: Payer: Self-pay | Admitting: *Deleted

## 2015-06-08 DIAGNOSIS — G2581 Restless legs syndrome: Secondary | ICD-10-CM

## 2015-06-08 DIAGNOSIS — G4734 Idiopathic sleep related nonobstructive alveolar hypoventilation: Secondary | ICD-10-CM

## 2015-06-08 DIAGNOSIS — E038 Other specified hypothyroidism: Secondary | ICD-10-CM

## 2015-06-08 DIAGNOSIS — M5126 Other intervertebral disc displacement, lumbar region: Secondary | ICD-10-CM

## 2015-06-08 DIAGNOSIS — M170 Bilateral primary osteoarthritis of knee: Secondary | ICD-10-CM

## 2015-06-08 DIAGNOSIS — Z720 Tobacco use: Secondary | ICD-10-CM

## 2015-06-08 DIAGNOSIS — M961 Postlaminectomy syndrome, not elsewhere classified: Secondary | ICD-10-CM

## 2015-06-08 NOTE — Telephone Encounter (Signed)
Jill Phillips calling about her restless legs.  She is trying to take her naproxen and Neurontin during the day. One or both has greatly helped.  Dr Naaman Plummer lowered her Neurontin to one and she is asking if it can be raised to two.

## 2015-06-10 NOTE — Telephone Encounter (Signed)
She may take it twice daily.  She has been on gabapentin at that dose before

## 2015-06-12 MED ORDER — GABAPENTIN 300 MG PO CAPS: 300.0000 mg | ORAL_CAPSULE | Freq: Two times a day (BID) | ORAL | Status: DC

## 2015-06-12 NOTE — Telephone Encounter (Signed)
New order placed and message left for [facility]

## 2015-06-13 ENCOUNTER — Telehealth: Payer: Self-pay | Admitting: *Deleted

## 2015-06-13 NOTE — Telephone Encounter (Signed)
Perhaps mrs. Sugalski can check with her insurance about which meds might be covered under her plan.

## 2015-06-13 NOTE — Telephone Encounter (Signed)
Movantix prior authorization was denied....FYI

## 2015-06-17 DIAGNOSIS — G4734 Idiopathic sleep related nonobstructive alveolar hypoventilation: Secondary | ICD-10-CM | POA: Diagnosis not present

## 2015-06-20 ENCOUNTER — Other Ambulatory Visit: Payer: Self-pay | Admitting: Internal Medicine

## 2015-06-21 ENCOUNTER — Other Ambulatory Visit: Payer: Self-pay | Admitting: Internal Medicine

## 2015-06-21 NOTE — Telephone Encounter (Signed)
Pt.notified

## 2015-06-21 NOTE — Telephone Encounter (Addendum)
Patient requesting Xeffexor refill from [facility] on [address]

## 2015-06-21 NOTE — Telephone Encounter (Signed)
This was sent yesterday.

## 2015-06-23 DIAGNOSIS — M7041 Prepatellar bursitis, right knee: Secondary | ICD-10-CM | POA: Diagnosis not present

## 2015-06-23 DIAGNOSIS — M5136 Other intervertebral disc degeneration, lumbar region: Secondary | ICD-10-CM | POA: Diagnosis not present

## 2015-06-23 DIAGNOSIS — M4806 Spinal stenosis, lumbar region: Secondary | ICD-10-CM | POA: Diagnosis not present

## 2015-06-26 ENCOUNTER — Ambulatory Visit: Payer: Medicare Other | Admitting: Pulmonary Disease

## 2015-06-27 ENCOUNTER — Other Ambulatory Visit: Payer: Self-pay | Admitting: *Deleted

## 2015-06-27 MED ORDER — CLONAZEPAM 2 MG PO TABS: 2.0000 mg | ORAL_TABLET | Freq: Two times a day (BID) | ORAL | Status: DC | PRN

## 2015-07-04 ENCOUNTER — Other Ambulatory Visit: Payer: Self-pay | Admitting: Physical Medicine & Rehabilitation

## 2015-07-04 ENCOUNTER — Encounter: Payer: Medicare Other | Attending: Physical Medicine & Rehabilitation | Admitting: Physical Medicine & Rehabilitation

## 2015-07-04 ENCOUNTER — Telehealth: Payer: Self-pay | Admitting: Internal Medicine

## 2015-07-04 ENCOUNTER — Encounter: Payer: Self-pay | Admitting: Physical Medicine & Rehabilitation

## 2015-07-04 VITALS — BP 101/59 | HR 89 | Resp 16

## 2015-07-04 DIAGNOSIS — M170 Bilateral primary osteoarthritis of knee: Secondary | ICD-10-CM | POA: Insufficient documentation

## 2015-07-04 DIAGNOSIS — G9619 Other disorders of meninges, not elsewhere classified: Secondary | ICD-10-CM | POA: Diagnosis not present

## 2015-07-04 DIAGNOSIS — K219 Gastro-esophageal reflux disease without esophagitis: Secondary | ICD-10-CM | POA: Diagnosis not present

## 2015-07-04 DIAGNOSIS — E785 Hyperlipidemia, unspecified: Secondary | ICD-10-CM | POA: Diagnosis not present

## 2015-07-04 DIAGNOSIS — Z9981 Dependence on supplemental oxygen: Secondary | ICD-10-CM | POA: Diagnosis not present

## 2015-07-04 DIAGNOSIS — Z720 Tobacco use: Secondary | ICD-10-CM

## 2015-07-04 DIAGNOSIS — Z79899 Other long term (current) drug therapy: Secondary | ICD-10-CM

## 2015-07-04 DIAGNOSIS — M2170 Unequal limb length (acquired), unspecified site: Secondary | ICD-10-CM | POA: Insufficient documentation

## 2015-07-04 DIAGNOSIS — E039 Hypothyroidism, unspecified: Secondary | ICD-10-CM | POA: Diagnosis not present

## 2015-07-04 DIAGNOSIS — M961 Postlaminectomy syndrome, not elsewhere classified: Secondary | ICD-10-CM | POA: Diagnosis not present

## 2015-07-04 DIAGNOSIS — I10 Essential (primary) hypertension: Secondary | ICD-10-CM | POA: Insufficient documentation

## 2015-07-04 DIAGNOSIS — G2581 Restless legs syndrome: Secondary | ICD-10-CM | POA: Diagnosis not present

## 2015-07-04 DIAGNOSIS — M5126 Other intervertebral disc displacement, lumbar region: Secondary | ICD-10-CM

## 2015-07-04 DIAGNOSIS — Z9889 Other specified postprocedural states: Secondary | ICD-10-CM | POA: Diagnosis not present

## 2015-07-04 DIAGNOSIS — E079 Disorder of thyroid, unspecified: Secondary | ICD-10-CM | POA: Insufficient documentation

## 2015-07-04 DIAGNOSIS — Z5181 Encounter for therapeutic drug level monitoring: Secondary | ICD-10-CM | POA: Diagnosis not present

## 2015-07-04 DIAGNOSIS — G894 Chronic pain syndrome: Secondary | ICD-10-CM

## 2015-07-04 DIAGNOSIS — J449 Chronic obstructive pulmonary disease, unspecified: Secondary | ICD-10-CM | POA: Diagnosis not present

## 2015-07-04 MED ORDER — FENTANYL 50 MCG/HR TD PT72
50.0000 ug | MEDICATED_PATCH | TRANSDERMAL | Status: DC
Start: 2015-07-04 — End: 2015-09-06

## 2015-07-04 MED ORDER — HYDROCODONE-ACETAMINOPHEN 10-325 MG PO TABS: 1.0000 | ORAL_TABLET | Freq: Three times a day (TID) | ORAL | Status: DC | PRN

## 2015-07-04 MED ORDER — FENTANYL 50 MCG/HR TD PT72: 50.0000 ug | MEDICATED_PATCH | TRANSDERMAL | Status: DC

## 2015-07-04 MED ORDER — PRAMIPEXOLE DIHYDROCHLORIDE 0.25 MG PO TABS: 0.2500 mg | ORAL_TABLET | Freq: Every day | ORAL | Status: DC

## 2015-07-04 NOTE — Addendum Note (Signed)
Addended by: Geryl Rankins D on: 07/04/2015 01:33 PM   Modules accepted: Orders

## 2015-07-04 NOTE — Patient Instructions (Signed)
PLEASE CALL ME WITH ANY PROBLEMS OR QUESTIONS (#[phone]).  HAVE A GOOD DAY!    

## 2015-07-04 NOTE — Telephone Encounter (Signed)
Opened in error

## 2015-07-04 NOTE — Progress Notes (Signed)
Subjective:    Patient ID: Jill Phillips, female    DOB: 1956/06/28, 59 y.o.   MRN: [id number]  HPI  Wilmary is here in follow up of her chronic pain. She has been in and out of the hospital over the summer. Apparently she had back surgery after I last saw her, but I cannot find record of it. I did find an MRI from the summer which reveald the following:  L1-2: Chronic degenerative disc disease with disc degeneration near complete loss of disc height. Previous decompressive surgery on the right which appears to be a partial hemilaminectomy and foramina ME. Endplate osteophytes and bulging of the disc. The central canal is sufficiently patent. There is some epidural fibrosis along the right side. There continues to be foraminal narrowing on the right that could affect the right L1 nerve root. No compressive foraminal stenosis on the left.  L2-3: Disc degeneration with endplate osteophytes and shallow protrusion of disc material. Bilateral facet and ligamentous hypertrophy. Mild stenosis of both lateral recesses without gross neural compression.  L3-4: Previous discectomy and fusion. Wide patency of the canal and foramina. No complicating features.  L4 to sacrum: Previous discectomy and fusion. Wide patency of the canal and foramina. 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  IMPRESSION: Since the previous study, the patient has undergone right-sided surgery at L1-2. There is mild epidural fibrosis along the right side of the thecal sac. There are endplate osteophytes in there is bulging of the disc, more towards the right. There is some continued narrowing of the foramen on the right that could possibly continue to affect the right L1 nerve root. Definite neural compression is not established.  She continues to struggle with her RLS symptoms. She asks if I can increase her klonopin any further. The pain starts in her knees and spreads  to her feet and then up to her thighs.    Pain Inventory Average Pain 6 Pain Right Now 7 My pain is NA  In the last 24 hours, has pain interfered with the following? General activity 9 Relation with others 9 Enjoyment of life 9 What TIME of day is your pain at its worst? Daytime and Night Sleep (in general) Good  Pain is worse with: walking, bending and some activites Pain improves with: rest and medication Relief from Meds: 9  Mobility Do you have any goals in this area?  no  Function Do you have any goals in this area?  no  Neuro/Psych No problems in this area  Prior Studies Any changes since last visit?  no  Physicians involved in your care Any changes since last visit?  no   Family History  Problem Relation Age of Onset  . Kidney disease Mother   . Heart disease Father   . Anuerysm Brother 29    brain  . Heart disease Brother   . Heart disease Sister 50    s/p CABG  . Hypertension Sister    Social History   Social History  . Marital Status: Divorced    Spouse Name: n/a  . Number of Children: 2  . Years of Education: 12+   Occupational History  . disability     back surgeries   Social History Main Topics  . Smoking status: Current Every Day Smoker -- 1.50 packs/day for 45 years    Types: Cigarettes  . Smokeless tobacco: Never Used     Comment: trying to quit  . Alcohol Use:  No  . Drug Use: No  . Sexual Activity: Not Asked   Other Topics Concern  . None   Social History Narrative   Lives alone.  One daughter is local, but is getting ready to move to [place], where her children live with their father.  The other daughter lives near [place], [place].   Past Surgical History  Procedure Laterality Date  . Appendectomy    . Abdominal hysterectomy    . Tubal ligation    . Spine surgery      thoracic x 1,  lumbar x 15  . Right hip replacement    . Knee surgeries r knee      arthroscopy- right  . Hammer toe surgery    . Sleep apnea surgery     . Cholecystectomy    . Joint replacement    . Total knee arthroplasty  05/29/2012    Procedure: TOTAL KNEE ARTHROPLASTY;  Surgeon: Mcarthur Rossetti, MD;  Location: [facility];  Service: Orthopedics;  Laterality: Right;  Right Total Knee Arthroplasty  . Back surgery      16 back surgeries  . Lumbar laminectomy/decompression microdiscectomy N/A 01/28/2014    Procedure: Minimally Invasive Right  L1-2 Microdiscectomy;  Surgeon: [name], MD;  Location: [place];  Service: Orthopedics;  Laterality: N/A;   Past Medical History  Diagnosis Date  . Postlaminectomy syndrome, thoracic region   . Osteoarthrosis, unspecified whether generalized or localized, lower leg   . Dysthymic disorder   . Calcifying tendinitis of shoulder   . Pain in joint, upper arm   . Chronic pain syndrome   . Lumbago   . Primary localized osteoarthrosis, lower leg   . Hypertension   . Hyperlipidemia   . GERD (gastroesophageal reflux disease)   . Thyroid disease   . Restless leg syndrome   . PONV (postoperative nausea and vomiting)   . Heart murmur   . Sleep apnea     s/p surgery- last sleep study 2011- doesnt use oxygen or machine at night as instructed  . History of blood transfusion 1980  . Hypothyroidism   . Active smoker   . COPD (chronic obstructive pulmonary disease) (HCC)     on nocturnal home o2   BP 101/59 mmHg  Pulse 89  Resp 16  SpO2 96%  Opioid Risk Score:   Fall Risk Score:  `1  Depression screen PHQ 2/9  Depression screen [facility] 2/9 05/22/2015 02/15/2015 11/24/2014 08/17/2014 06/08/2014 05/05/2014 01/03/2014  Decreased Interest 0 0 0 0 0 0 0  Down, Depressed, Hopeless 0 0 0 1 1 1  0  PHQ - 2 Score 0 0 0 1 1 1  0  Altered sleeping - - 3 - - - -  Tired, decreased energy - - 3 - - - -  Change in appetite - - 1 - - - -  Feeling bad or failure about yourself  - - 0 - - - -  Trouble concentrating - - 0 - - - -  Moving slowly or fidgety/restless - - 0 - - - -  Suicidal thoughts - - 0 - - - -  PHQ-9  Score - - 7 - - - -     Review of Systems  Respiratory: Positive for apnea.   All other systems reviewed and are negative.      Objective:   Physical Exam  Constitutional: She is oriented to person, place, and time. She appears well-developed and well-nourished.  HENT:  Head: Normocephalic.  Eyes: EOM are normal. Pupils are equal, round, and reactive to light.  Neck: Normal range of motion.  Cardiovascular: Normal rate.  Pulmonary/Chest: Effort normal.  Abdominal: Soft.  Musculoskeletal:  Right knee with minimal tenderness. Post-op scars noted. Knee rom 120+ with flexion. Weight bearing better with minimal antalgia seen.  Right shoulder with only mild tenderness with ROM. No shoulder instability noted. Had full AROM. Low back/pelvic with persistent hemi-elevation of the right pelvis and tightness of the lumbar paraspinals particularly on the right. Low back ROM is limited today due to pain. Sensory exam in tact in either leg. Strength fairly functional Neurological: She is alert and oriented to person, place, and time.  Skin: Skin is warm.  Psychiatric: She has a normal mood and affect. Her behavior is normal. Judgment and thought content normal.    Assessment & Plan:   ASSESSMENT:  1. 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 Osteoarthritis of the knees bilaterally, right greater than left.  3. Rotator cuff syndrome/subacromial bursitis.  4. Restless legs syndrome.  5. O2 dependent at night  6. Tobacco abuse.  7. Right leg length discrepancy.    PLAN:  1. Refilled fentanyl to 50 mcg every 72 hours. Hydrocodone was refilled.  2. Continue with 4mg  klonopin at night. Continue with gabapentin also. Increase mirapex to 0.25mg  1-2 tabs HS. 3. Smoking still an issue..  4. Surgical plan per orthopedics.  6. Follow up with me or  NP in 2 month. 30 minutes of face to face patient care time were spent during this visit. All questions were encouraged and  answered.

## 2015-07-05 LAB — PMP ALCOHOL METABOLITE (ETG): Ethyl Glucuronide (EtG): NEGATIVE ng/mL

## 2015-07-07 ENCOUNTER — Other Ambulatory Visit: Payer: Self-pay | Admitting: Internal Medicine

## 2015-07-07 DIAGNOSIS — F32A Depression, unspecified: Secondary | ICD-10-CM

## 2015-07-07 DIAGNOSIS — F329 Major depressive disorder, single episode, unspecified: Secondary | ICD-10-CM

## 2015-07-07 MED ORDER — ALPRAZOLAM 1 MG PO TABS: 1.0000 mg | ORAL_TABLET | Freq: Two times a day (BID) | ORAL | Status: DC | PRN

## 2015-07-07 MED ORDER — OMEPRAZOLE 20 MG PO CPDR: 20.0000 mg | DELAYED_RELEASE_CAPSULE | Freq: Two times a day (BID) | ORAL | Status: DC

## 2015-07-07 NOTE — Telephone Encounter (Signed)
Pt requesting the nurse to call back regarding Rx to be mail order. Please call pt back.

## 2015-07-07 NOTE — Telephone Encounter (Signed)
The alprazolam will need to be written by an attending and faxed in due to being in [place]. Sending to attending and dr Marvel Plan

## 2015-07-07 NOTE — Telephone Encounter (Signed)
Spoke w/ pt, she states she has taken both clonazepam and xanax for years and dr Naaman Plummer has no problem with it. She was offered an appt several times and finally agreed when she realized she needs surgical clearance from dr Marvel Plan so she agreed to an appt, wed 11/9

## 2015-07-09 LAB — OPIATES/OPIOIDS (LC/MS-MS)
Codeine Urine: NEGATIVE ng/mL (ref <50)
Hydrocodone: 2676 ng/mL (ref <50)
Hydromorphone: 637 ng/mL (ref <50)
Morphine Urine: NEGATIVE ng/mL (ref <50)
Norhydrocodone, Ur: 9009 ng/mL (ref <50)
Noroxycodone, Ur: NEGATIVE ng/mL (ref <50)
Oxycodone, ur: NEGATIVE ng/mL (ref <50)
Oxymorphone: NEGATIVE ng/mL (ref <50)

## 2015-07-09 LAB — FENTANYL (GC/LC/MS), URINE
Fentanyl, confirm: 94.7 ng/mL (ref <0.5)
Norfentanyl, confirm: 563 ng/mL (ref <0.5)

## 2015-07-09 LAB — BENZODIAZEPINES (GC/LC/MS), URINE
Alprazolam metabolite (GC/LC/MS), ur confirm: NEGATIVE ng/mL (ref <25)
Clonazepam metabolite (GC/LC/MS), ur confirm: 2040 ng/mL (ref <25)
Flurazepam metabolite (GC/LC/MS), ur confirm: NEGATIVE ng/mL (ref <50)
Lorazepam (GC/LC/MS), ur confirm: NEGATIVE ng/mL (ref <50)
Midazolam (GC/LC/MS), ur confirm: NEGATIVE ng/mL (ref <50)
Nordiazepam (GC/LC/MS), ur confirm: NEGATIVE ng/mL (ref <50)
Oxazepam (GC/LC/MS), ur confirm: NEGATIVE ng/mL (ref <50)
Temazepam (GC/LC/MS), ur confirm: NEGATIVE ng/mL (ref <50)
Triazolam metabolite (GC/LC/MS), ur confirm: NEGATIVE ng/mL (ref <50)

## 2015-07-09 LAB — CARISOPRODOL (GC/LC/MS), URINE: Meprobamate (GC/LC/MS), ur confirm: 6145 ng/mL — ABNORMAL HIGH (ref <1000)

## 2015-07-11 LAB — PRESCRIPTION MONITORING PROFILE (SOLSTAS)
Amphetamine/Meth: NEGATIVE ng/mL
Barbiturate Screen, Urine: NEGATIVE ng/mL
Buprenorphine, Urine: NEGATIVE ng/mL
Cannabinoid Scrn, Ur: NEGATIVE ng/mL
Cocaine Metabolites: NEGATIVE ng/mL
Creatinine, Urine: 216.44 mg/dL (ref >20.0)
MDMA URINE: NEGATIVE ng/mL
Meperidine, Ur: NEGATIVE ng/mL
Methadone Screen, Urine: NEGATIVE ng/mL
Nitrites, Initial: NEGATIVE ug/mL
Oxycodone Screen, Ur: NEGATIVE ng/mL
Propoxyphene: NEGATIVE ng/mL
Tapentadol, urine: NEGATIVE ng/mL
Tramadol Scrn, Ur: NEGATIVE ng/mL
Zolpidem, Urine: NEGATIVE ng/mL
pH, Initial: 5 pH (ref 4.5–8.9)

## 2015-07-12 ENCOUNTER — Encounter: Payer: Medicare Other | Admitting: Internal Medicine

## 2015-07-17 ENCOUNTER — Other Ambulatory Visit: Payer: Self-pay | Admitting: Physical Medicine & Rehabilitation

## 2015-07-18 ENCOUNTER — Encounter: Payer: Self-pay | Admitting: Student

## 2015-07-18 DIAGNOSIS — G4734 Idiopathic sleep related nonobstructive alveolar hypoventilation: Secondary | ICD-10-CM | POA: Diagnosis not present

## 2015-07-19 ENCOUNTER — Other Ambulatory Visit (HOSPITAL_COMMUNITY): Payer: Self-pay | Admitting: Specialist

## 2015-07-19 NOTE — Progress Notes (Signed)
Urine drug screen for this encounter is consistent for prescribed medication 

## 2015-07-21 ENCOUNTER — Telehealth: Payer: Self-pay | Admitting: Physical Medicine & Rehabilitation

## 2015-07-21 NOTE — Telephone Encounter (Signed)
Patients legs are hurting and would like to get her Mirapex increased, she is on the 0.25mg .

## 2015-07-21 NOTE — Telephone Encounter (Signed)
Please advise 

## 2015-07-24 ENCOUNTER — Telehealth: Payer: Self-pay

## 2015-07-24 ENCOUNTER — Encounter (HOSPITAL_COMMUNITY): Payer: Self-pay

## 2015-07-24 ENCOUNTER — Encounter (HOSPITAL_COMMUNITY)
Admission: RE | Admit: 2015-07-24 | Discharge: 2015-07-24 | Disposition: A | Discharge status: Home / Self Care | Payer: Medicare Other | Source: Ambulatory Visit | Attending: Specialist | Admitting: Specialist

## 2015-07-24 DIAGNOSIS — Z01810 Encounter for preprocedural cardiovascular examination: Secondary | ICD-10-CM | POA: Diagnosis not present

## 2015-07-24 DIAGNOSIS — Z01812 Encounter for preprocedural laboratory examination: Secondary | ICD-10-CM | POA: Diagnosis not present

## 2015-07-24 HISTORY — DX: Herpesviral infection of urogenital system, unspecified: A60.00

## 2015-07-24 HISTORY — DX: Anxiety disorder, unspecified: F41.9

## 2015-07-24 LAB — COMPREHENSIVE METABOLIC PANEL
ALT: 17 U/L (ref 14–54)
AST: 30 U/L (ref 15–41)
Albumin: 4.1 g/dL (ref 3.5–5.0)
Alkaline Phosphatase: 112 U/L (ref 38–126)
Anion gap: 9 (ref 5–15)
BUN: 22 mg/dL — ABNORMAL HIGH (ref 6–20)
CO2: 30 mmol/L (ref 22–32)
Calcium: 9.5 mg/dL (ref 8.9–10.3)
Chloride: 103 mmol/L (ref 101–111)
Creatinine, Ser: 1.34 mg/dL — ABNORMAL HIGH (ref 0.44–1.00)
GFR calc Af Amer: 49 mL/min — ABNORMAL LOW (ref >60)
GFR calc non Af Amer: 42 mL/min — ABNORMAL LOW (ref >60)
Glucose, Bld: 99 mg/dL (ref 65–99)
Potassium: 4.6 mmol/L (ref 3.5–5.1)
Sodium: 142 mmol/L (ref 135–145)
Total Bilirubin: 0.5 mg/dL (ref 0.3–1.2)
Total Protein: 7.4 g/dL (ref 6.5–8.1)

## 2015-07-24 LAB — CBC
HCT: 36.8 % (ref 36.0–46.0)
Hemoglobin: 12 g/dL (ref 12.0–15.0)
MCH: 31.5 pg (ref 26.0–34.0)
MCHC: 32.6 g/dL (ref 30.0–36.0)
MCV: 96.6 fL (ref 78.0–100.0)
Platelets: 250 10*3/uL (ref 150–400)
RBC: 3.81 MIL/uL — ABNORMAL LOW (ref 3.87–5.11)
RDW: 13.6 % (ref 11.5–15.5)
WBC: 8.6 10*3/uL (ref 4.0–10.5)

## 2015-07-24 LAB — SURGICAL PCR SCREEN
MRSA, PCR: NEGATIVE
Staphylococcus aureus: NEGATIVE

## 2015-07-24 MED ORDER — PRAMIPEXOLE DIHYDROCHLORIDE 0.5 MG PO TABS: 0.5000 mg | ORAL_TABLET | Freq: Every day | ORAL | Status: DC

## 2015-07-24 NOTE — Telephone Encounter (Signed)
rx written and sent

## 2015-07-24 NOTE — Pre-Procedure Instructions (Signed)
    DELAYLA DESKINS  07/24/2015     Your procedure is scheduled on Tuesday, November 29.  Report to [facility] Admitting at  5:30A.M.               Your surgery is scheduled for 7:30 A.M.   Call this number if you have problems the morning of surgery:[phone]                For any other questions, please call [phone], Monday - Friday 8 AM - 4 PM.   Remember:  Do not eat food or drink liquids after midnight  Monday, November 28.  Take these medicines the morning of surgery with A SIP OF WATER:  amLODipine (NORVASC), gabapentin (NEURONTIN),  levothyroxine (SYNTHROID, LEVOTHROID), omeprazole (PRILOSEC), venlafaxine XR (EFFEXOR-XR).              May Use Nasal Sprays and Inhalers.                Take if needed: HYDROcodone-acetaminophen ([place]), clonazePAM (KLONOPIN), ALPRAZolam Duanne Moron).               Stop taking naproxen (NAPROSYN), and using VOLTAREN Gel.   Do not wear jewelry, make-up or nail polish.   Do not wear lotions, powders, or perfumes.    Do not shave 48 hours prior to surgery.    Do not bring valuables to the hospital.   [facility] is not responsible for any belongings or valuables.  Contacts, dentures or bridgework may not be worn into surgery.  Leave your suitcase in the car.  After surgery it may be brought to your room.  For patients admitted to the hospital, discharge time will be determined by your treatment team.  Special instructions:  Review  [facility] - Preparing For Surgery.  Please read over the following fact sheets that you were given. Pain Booklet, Coughing and Deep Breathing and Surgical Site Infection Prevention

## 2015-07-24 NOTE — Telephone Encounter (Signed)
Pt called, stating that her Mirapex is causing her to itch all over. She stated that when she stopped taking the medication, the itching had stopped. She has a lot of pain in her legs and was wondering if she could be put on a different medication that would help. Please advise.

## 2015-07-24 NOTE — Telephone Encounter (Signed)
Stop the mirapex given that she's on the increased requip

## 2015-07-26 NOTE — Telephone Encounter (Signed)
Left message to call back to clarify if pt is taking Requip.

## 2015-07-31 MED ORDER — PRIMIDONE 50 MG PO TABS: 50.0000 mg | ORAL_TABLET | Freq: Three times a day (TID) | ORAL | Status: DC

## 2015-07-31 MED ORDER — VANCOMYCIN HCL IN DEXTROSE 1-5 GM/200ML-% IV SOLN
1000.0000 mg | INTRAVENOUS | Status: AC
  Administered 2015-08-01: 1000 mg via INTRAVENOUS
  Filled 2015-07-31: qty 200

## 2015-07-31 MED ORDER — CHLORHEXIDINE GLUCONATE 4 % EX LIQD: 60.0000 mL | Freq: Once | CUTANEOUS | Status: DC

## 2015-07-31 MED ORDER — PRIMIDONE 50 MG PO TABS: 50.0000 mg | ORAL_TABLET | Freq: Two times a day (BID) | ORAL | Status: DC

## 2015-07-31 NOTE — Telephone Encounter (Signed)
requip is still on her list and i was obviously under the impression that she was still taking it. We can try primidone which I just wrote the rx for.

## 2015-07-31 NOTE — Telephone Encounter (Signed)
Patients legs hurt all day long.  Needs to know what she can do.

## 2015-07-31 NOTE — Telephone Encounter (Signed)
Spoke with Pt. Called in the new rx for Primidone to [facility].

## 2015-07-31 NOTE — Telephone Encounter (Signed)
Spoke with pt. Pt stated that she is not on Requip. She states that she was on Requip a few years ago, but it did not provide her any relief. She is still wanting something else to take to help her leg pain. Please advise. Thanks!

## 2015-08-01 ENCOUNTER — Inpatient Hospital Stay (HOSPITAL_COMMUNITY)
Admission: RE | Admit: 2015-08-01 | Discharge: 2015-08-05 | DRG: 457 | Disposition: A | Discharge status: Home / Self Care | Payer: Medicare Other | Source: Ambulatory Visit | Attending: Specialist | Admitting: Specialist

## 2015-08-01 ENCOUNTER — Encounter (HOSPITAL_COMMUNITY): Payer: Self-pay | Admitting: General Practice

## 2015-08-01 ENCOUNTER — Inpatient Hospital Stay (HOSPITAL_COMMUNITY): Payer: Medicare Other

## 2015-08-01 ENCOUNTER — Inpatient Hospital Stay (HOSPITAL_COMMUNITY): Payer: Medicare Other | Admitting: Certified Registered Nurse Anesthetist

## 2015-08-01 ENCOUNTER — Encounter (HOSPITAL_COMMUNITY)
Admission: RE | Disposition: A | Discharge status: Home / Self Care | Payer: Medicare Other | Source: Ambulatory Visit | Attending: Specialist

## 2015-08-01 DIAGNOSIS — M4806 Spinal stenosis, lumbar region: Secondary | ICD-10-CM | POA: Diagnosis present

## 2015-08-01 DIAGNOSIS — F17210 Nicotine dependence, cigarettes, uncomplicated: Secondary | ICD-10-CM | POA: Diagnosis present

## 2015-08-01 DIAGNOSIS — M5136 Other intervertebral disc degeneration, lumbar region: Secondary | ICD-10-CM | POA: Diagnosis not present

## 2015-08-01 DIAGNOSIS — G894 Chronic pain syndrome: Secondary | ICD-10-CM | POA: Diagnosis not present

## 2015-08-01 DIAGNOSIS — J449 Chronic obstructive pulmonary disease, unspecified: Secondary | ICD-10-CM | POA: Diagnosis present

## 2015-08-01 DIAGNOSIS — E785 Hyperlipidemia, unspecified: Secondary | ICD-10-CM | POA: Diagnosis not present

## 2015-08-01 DIAGNOSIS — M4186 Other forms of scoliosis, lumbar region: Secondary | ICD-10-CM | POA: Diagnosis not present

## 2015-08-01 DIAGNOSIS — Z9049 Acquired absence of other specified parts of digestive tract: Secondary | ICD-10-CM

## 2015-08-01 DIAGNOSIS — Z90710 Acquired absence of both cervix and uterus: Secondary | ICD-10-CM

## 2015-08-01 DIAGNOSIS — K219 Gastro-esophageal reflux disease without esophagitis: Secondary | ICD-10-CM | POA: Diagnosis not present

## 2015-08-01 DIAGNOSIS — M4326 Fusion of spine, lumbar region: Secondary | ICD-10-CM | POA: Diagnosis not present

## 2015-08-01 DIAGNOSIS — H60399 Other infective otitis externa, unspecified ear: Secondary | ICD-10-CM | POA: Diagnosis not present

## 2015-08-01 DIAGNOSIS — M5126 Other intervertebral disc displacement, lumbar region: Secondary | ICD-10-CM | POA: Diagnosis present

## 2015-08-01 DIAGNOSIS — M545 Low back pain: Secondary | ICD-10-CM | POA: Diagnosis present

## 2015-08-01 DIAGNOSIS — G2581 Restless legs syndrome: Secondary | ICD-10-CM | POA: Diagnosis present

## 2015-08-01 DIAGNOSIS — E039 Hypothyroidism, unspecified: Secondary | ICD-10-CM | POA: Diagnosis present

## 2015-08-01 DIAGNOSIS — Z96641 Presence of right artificial hip joint: Secondary | ICD-10-CM | POA: Diagnosis present

## 2015-08-01 DIAGNOSIS — J9811 Atelectasis: Secondary | ICD-10-CM | POA: Diagnosis not present

## 2015-08-01 DIAGNOSIS — D62 Acute posthemorrhagic anemia: Secondary | ICD-10-CM | POA: Diagnosis not present

## 2015-08-01 DIAGNOSIS — I10 Essential (primary) hypertension: Secondary | ICD-10-CM | POA: Diagnosis not present

## 2015-08-01 DIAGNOSIS — Z419 Encounter for procedure for purposes other than remedying health state, unspecified: Secondary | ICD-10-CM

## 2015-08-01 DIAGNOSIS — H6090 Unspecified otitis externa, unspecified ear: Secondary | ICD-10-CM | POA: Diagnosis not present

## 2015-08-01 DIAGNOSIS — Z96651 Presence of right artificial knee joint: Secondary | ICD-10-CM | POA: Diagnosis not present

## 2015-08-01 DIAGNOSIS — Z981 Arthrodesis status: Secondary | ICD-10-CM

## 2015-08-01 DIAGNOSIS — M51369 Other intervertebral disc degeneration, lumbar region without mention of lumbar back pain or lower extremity pain: Secondary | ICD-10-CM | POA: Diagnosis present

## 2015-08-01 DIAGNOSIS — F341 Dysthymic disorder: Secondary | ICD-10-CM | POA: Diagnosis not present

## 2015-08-01 DIAGNOSIS — M4316 Spondylolisthesis, lumbar region: Secondary | ICD-10-CM | POA: Diagnosis present

## 2015-08-01 DIAGNOSIS — R509 Fever, unspecified: Secondary | ICD-10-CM

## 2015-08-01 DIAGNOSIS — M419 Scoliosis, unspecified: Secondary | ICD-10-CM | POA: Diagnosis not present

## 2015-08-01 HISTORY — PX: LUMBAR FUSION: SHX111

## 2015-08-01 LAB — CBC
HCT: 28.3 % — ABNORMAL LOW (ref 36.0–46.0)
Hemoglobin: 9.2 g/dL — ABNORMAL LOW (ref 12.0–15.0)
MCH: 31.3 pg (ref 26.0–34.0)
MCHC: 32.5 g/dL (ref 30.0–36.0)
MCV: 96.3 fL (ref 78.0–100.0)
Platelets: 180 10*3/uL (ref 150–400)
RBC: 2.94 MIL/uL — ABNORMAL LOW (ref 3.87–5.11)
RDW: 13.7 % (ref 11.5–15.5)
WBC: 9.5 10*3/uL (ref 4.0–10.5)

## 2015-08-01 SURGERY — FUSION, SPINE, LUMBAR, MINIMALLY INVASIVE
Anesthesia: General

## 2015-08-01 MED ORDER — FLEET ENEMA 7-19 GM/118ML RE ENEM: 1.0000 | ENEMA | Freq: Once | RECTAL | Status: DC | PRN

## 2015-08-01 MED ORDER — NICOTINE 14 MG/24HR TD PT24: 14.0000 mg | MEDICATED_PATCH | Freq: Every day | TRANSDERMAL | Status: DC

## 2015-08-01 MED ORDER — BENAZEPRIL HCL 20 MG PO TABS
40.0000 mg | ORAL_TABLET | Freq: Every day | ORAL | Status: DC
  Filled 2015-08-01: qty 2

## 2015-08-01 MED ORDER — 0.9 % SODIUM CHLORIDE (POUR BTL) OPTIME
TOPICAL | Status: DC | PRN
Start: 2015-08-01 — End: 2015-08-01
  Administered 2015-08-01: 1000 mL

## 2015-08-01 MED ORDER — BUPIVACAINE HCL (PF) 0.5 % IJ SOLN
INTRAMUSCULAR | Status: AC
  Filled 2015-08-01: qty 30

## 2015-08-01 MED ORDER — THROMBIN 20000 UNITS EX SOLR
CUTANEOUS | Status: AC
  Filled 2015-08-01: qty 20000

## 2015-08-01 MED ORDER — VANCOMYCIN HCL 500 MG IV SOLR
INTRAVENOUS | Status: DC | PRN
  Administered 2015-08-01: 500 mg via TOPICAL

## 2015-08-01 MED ORDER — LACTATED RINGERS IV SOLN
INTRAVENOUS | Status: DC | PRN
  Administered 2015-08-01 (×2): via INTRAVENOUS

## 2015-08-01 MED ORDER — SENNOSIDES-DOCUSATE SODIUM 8.6-50 MG PO TABS: 1.0000 | ORAL_TABLET | Freq: Every evening | ORAL | Status: DC | PRN

## 2015-08-01 MED ORDER — MIDAZOLAM HCL 2 MG/2ML IJ SOLN
INTRAMUSCULAR | Status: AC
  Filled 2015-08-01: qty 2

## 2015-08-01 MED ORDER — METHOCARBAMOL 500 MG PO TABS
500.0000 mg | ORAL_TABLET | Freq: Four times a day (QID) | ORAL | Status: DC | PRN
  Administered 2015-08-02 – 2015-08-04 (×3): 500 mg via ORAL
  Filled 2015-08-01 (×3): qty 1

## 2015-08-01 MED ORDER — VANCOMYCIN HCL 500 MG IV SOLR
INTRAVENOUS | Status: AC
  Filled 2015-08-01: qty 500

## 2015-08-01 MED ORDER — THROMBIN 20000 UNITS EX SOLR
CUTANEOUS | Status: DC | PRN
  Administered 2015-08-01: 20000 [IU] via TOPICAL

## 2015-08-01 MED ORDER — ROCURONIUM BROMIDE 100 MG/10ML IV SOLN
INTRAVENOUS | Status: DC | PRN
  Administered 2015-08-01 (×4): 10 mg via INTRAVENOUS
  Administered 2015-08-01: 50 mg via INTRAVENOUS
  Administered 2015-08-01 (×2): 10 mg via INTRAVENOUS

## 2015-08-01 MED ORDER — VANCOMYCIN HCL IN DEXTROSE 1-5 GM/200ML-% IV SOLN
1000.0000 mg | Freq: Two times a day (BID) | INTRAVENOUS | Status: AC
  Administered 2015-08-01: 1000 mg via INTRAVENOUS
  Filled 2015-08-01: qty 200

## 2015-08-01 MED ORDER — ALBUTEROL SULFATE (2.5 MG/3ML) 0.083% IN NEBU: 2.5000 mg | INHALATION_SOLUTION | Freq: Four times a day (QID) | RESPIRATORY_TRACT | Status: DC | PRN

## 2015-08-01 MED ORDER — PROPOFOL 10 MG/ML IV BOLUS
INTRAVENOUS | Status: AC
  Filled 2015-08-01: qty 20

## 2015-08-01 MED ORDER — ACETAMINOPHEN 650 MG RE SUPP: 650.0000 mg | Freq: Four times a day (QID) | RECTAL | Status: DC | PRN

## 2015-08-01 MED ORDER — GABAPENTIN 300 MG PO CAPS
300.0000 mg | ORAL_CAPSULE | Freq: Two times a day (BID) | ORAL | Status: DC
  Administered 2015-08-01 – 2015-08-05 (×8): 300 mg via ORAL
  Filled 2015-08-01 (×8): qty 1

## 2015-08-01 MED ORDER — METHOCARBAMOL 1000 MG/10ML IJ SOLN: 500.0000 mg | Freq: Four times a day (QID) | INTRAMUSCULAR | Status: DC | PRN

## 2015-08-01 MED ORDER — HYDROCODONE-ACETAMINOPHEN 10-325 MG PO TABS
1.0000 | ORAL_TABLET | ORAL | Status: DC | PRN
Start: 2015-08-01 — End: 2015-08-05
  Administered 2015-08-01 – 2015-08-04 (×4): 1 via ORAL
  Filled 2015-08-01 (×4): qty 1

## 2015-08-01 MED ORDER — GLYCOPYRROLATE 0.2 MG/ML IJ SOLN
INTRAMUSCULAR | Status: DC | PRN
  Administered 2015-08-01: 0.4 mg via INTRAVENOUS

## 2015-08-01 MED ORDER — FENTANYL CITRATE (PF) 250 MCG/5ML IJ SOLN
INTRAMUSCULAR | Status: AC
  Filled 2015-08-01: qty 5

## 2015-08-01 MED ORDER — ONDANSETRON HCL 4 MG PO TABS
4.0000 mg | ORAL_TABLET | Freq: Four times a day (QID) | ORAL | Status: DC | PRN
  Administered 2015-08-04: 4 mg via ORAL
  Filled 2015-08-01: qty 1

## 2015-08-01 MED ORDER — ALBUMIN HUMAN 5 % IV SOLN
12.5000 g | Freq: Once | INTRAVENOUS | Status: AC
  Administered 2015-08-01: 12.5 g via INTRAVENOUS

## 2015-08-01 MED ORDER — ALBUMIN HUMAN 5 % IV SOLN
INTRAVENOUS | Status: AC
  Filled 2015-08-01: qty 250

## 2015-08-01 MED ORDER — HYDROCHLOROTHIAZIDE 25 MG PO TABS
12.5000 mg | ORAL_TABLET | Freq: Every day | ORAL | Status: DC
Start: 2015-08-01 — End: 2015-08-05
  Filled 2015-08-01: qty 1

## 2015-08-01 MED ORDER — HYDROMORPHONE HCL 1 MG/ML IJ SOLN
1.0000 mg | INTRAMUSCULAR | Status: DC | PRN
  Filled 2015-08-01: qty 1

## 2015-08-01 MED ORDER — PHENYLEPHRINE HCL 10 MG/ML IJ SOLN
INTRAMUSCULAR | Status: DC | PRN
  Administered 2015-08-01: 40 ug via INTRAVENOUS
  Administered 2015-08-01: 120 ug via INTRAVENOUS
  Administered 2015-08-01: 40 ug via INTRAVENOUS
  Administered 2015-08-01: 80 ug via INTRAVENOUS
  Administered 2015-08-01 (×3): 120 ug via INTRAVENOUS

## 2015-08-01 MED ORDER — TIOTROPIUM BROMIDE MONOHYDRATE 18 MCG IN CAPS
18.0000 ug | ORAL_CAPSULE | Freq: Every day | RESPIRATORY_TRACT | Status: DC
  Administered 2015-08-02 – 2015-08-05 (×4): 18 ug via RESPIRATORY_TRACT
  Filled 2015-08-01: qty 5

## 2015-08-01 MED ORDER — FENTANYL CITRATE (PF) 100 MCG/2ML IJ SOLN
INTRAMUSCULAR | Status: DC | PRN
  Administered 2015-08-01 (×8): 50 ug via INTRAVENOUS
  Administered 2015-08-01: 100 ug via INTRAVENOUS
  Administered 2015-08-01: 50 ug via INTRAVENOUS
  Administered 2015-08-01: 150 ug via INTRAVENOUS
  Administered 2015-08-01: 100 ug via INTRAVENOUS
  Administered 2015-08-01 (×2): 50 ug via INTRAVENOUS

## 2015-08-01 MED ORDER — NEOSTIGMINE METHYLSULFATE 10 MG/10ML IV SOLN
INTRAVENOUS | Status: DC | PRN
  Administered 2015-08-01: 3 mg via INTRAVENOUS

## 2015-08-01 MED ORDER — HYDROMORPHONE HCL 1 MG/ML IJ SOLN
INTRAMUSCULAR | Status: AC
  Administered 2015-08-01: 0.5 mg via INTRAVENOUS
  Filled 2015-08-01: qty 1

## 2015-08-01 MED ORDER — ONDANSETRON HCL 4 MG/2ML IJ SOLN
4.0000 mg | Freq: Four times a day (QID) | INTRAMUSCULAR | Status: DC | PRN
  Administered 2015-08-01 – 2015-08-03 (×2): 4 mg via INTRAVENOUS
  Filled 2015-08-01 (×2): qty 2

## 2015-08-01 MED ORDER — DIPHENHYDRAMINE HCL 12.5 MG/5ML PO ELIX: 12.5000 mg | ORAL_SOLUTION | ORAL | Status: DC | PRN

## 2015-08-01 MED ORDER — CARISOPRODOL 350 MG PO TABS
350.0000 mg | ORAL_TABLET | Freq: Three times a day (TID) | ORAL | Status: DC
  Administered 2015-08-02 – 2015-08-05 (×10): 350 mg via ORAL
  Filled 2015-08-01 (×10): qty 1

## 2015-08-01 MED ORDER — LEVOTHYROXINE SODIUM 50 MCG PO TABS
50.0000 ug | ORAL_TABLET | Freq: Every day | ORAL | Status: DC
  Administered 2015-08-02 – 2015-08-05 (×4): 50 ug via ORAL
  Filled 2015-08-01 (×4): qty 1

## 2015-08-01 MED ORDER — MIDAZOLAM HCL 5 MG/5ML IJ SOLN
INTRAMUSCULAR | Status: DC | PRN
  Administered 2015-08-01: 2 mg via INTRAVENOUS

## 2015-08-01 MED ORDER — SODIUM CHLORIDE 0.9 % IV SOLN
INTRAVENOUS | Status: DC
  Administered 2015-08-01: 100 mL/h via INTRAVENOUS

## 2015-08-01 MED ORDER — VENLAFAXINE HCL ER 75 MG PO CP24
150.0000 mg | ORAL_CAPSULE | Freq: Every day | ORAL | Status: DC
  Administered 2015-08-02 – 2015-08-05 (×4): 150 mg via ORAL
  Filled 2015-08-01 (×4): qty 2

## 2015-08-01 MED ORDER — MIDAZOLAM HCL 2 MG/2ML IJ SOLN: 0.5000 mg | Freq: Once | INTRAMUSCULAR | Status: DC | PRN

## 2015-08-01 MED ORDER — MEPERIDINE HCL 25 MG/ML IJ SOLN: 6.2500 mg | INTRAMUSCULAR | Status: DC | PRN

## 2015-08-01 MED ORDER — PRAVASTATIN SODIUM 40 MG PO TABS
40.0000 mg | ORAL_TABLET | Freq: Every day | ORAL | Status: DC
  Administered 2015-08-01 – 2015-08-04 (×4): 40 mg via ORAL
  Filled 2015-08-01 (×4): qty 1

## 2015-08-01 MED ORDER — DOCUSATE SODIUM 100 MG PO CAPS
100.0000 mg | ORAL_CAPSULE | Freq: Two times a day (BID) | ORAL | Status: DC
  Administered 2015-08-01 – 2015-08-04 (×7): 100 mg via ORAL
  Filled 2015-08-01 (×7): qty 1

## 2015-08-01 MED ORDER — HYDROCODONE-ACETAMINOPHEN 7.5-325 MG PO TABS
1.0000 | ORAL_TABLET | Freq: Four times a day (QID) | ORAL | Status: DC
Start: 2015-08-01 — End: 2015-08-05
  Administered 2015-08-02 – 2015-08-04 (×8): 1 via ORAL
  Filled 2015-08-01 (×9): qty 1

## 2015-08-01 MED ORDER — ONDANSETRON HCL 4 MG/2ML IJ SOLN
INTRAMUSCULAR | Status: DC | PRN
  Administered 2015-08-01 (×2): 4 mg via INTRAVENOUS

## 2015-08-01 MED ORDER — PHENYLEPHRINE HCL 10 MG/ML IJ SOLN
10.0000 mg | INTRAMUSCULAR | Status: DC | PRN
  Administered 2015-08-01: 15 ug/min via INTRAVENOUS

## 2015-08-01 MED ORDER — PROPOFOL 10 MG/ML IV BOLUS
INTRAVENOUS | Status: DC | PRN
  Administered 2015-08-01: 150 mg via INTRAVENOUS

## 2015-08-01 MED ORDER — METOCLOPRAMIDE HCL 5 MG/ML IJ SOLN: 5.0000 mg | Freq: Three times a day (TID) | INTRAMUSCULAR | Status: DC | PRN

## 2015-08-01 MED ORDER — PANTOPRAZOLE SODIUM 40 MG PO TBEC
40.0000 mg | DELAYED_RELEASE_TABLET | Freq: Every day | ORAL | Status: DC
  Administered 2015-08-01 – 2015-08-05 (×5): 40 mg via ORAL
  Filled 2015-08-01 (×5): qty 1

## 2015-08-01 MED ORDER — VALACYCLOVIR HCL 500 MG PO TABS
500.0000 mg | ORAL_TABLET | Freq: Every day | ORAL | Status: DC
  Administered 2015-08-01 – 2015-08-05 (×5): 500 mg via ORAL
  Filled 2015-08-01 (×5): qty 1

## 2015-08-01 MED ORDER — HYDROMORPHONE HCL 1 MG/ML IJ SOLN
0.2500 mg | INTRAMUSCULAR | Status: DC | PRN
  Administered 2015-08-01 (×2): 0.5 mg via INTRAVENOUS

## 2015-08-01 MED ORDER — ALBUMIN HUMAN 5 % IV SOLN
INTRAVENOUS | Status: DC | PRN
  Administered 2015-08-01 (×2): via INTRAVENOUS

## 2015-08-01 MED ORDER — BUPIVACAINE LIPOSOME 1.3 % IJ SUSP
20.0000 mL | INTRAMUSCULAR | Status: AC
  Administered 2015-08-01: 15 mL
  Filled 2015-08-01: qty 20

## 2015-08-01 MED ORDER — AMLODIPINE BESYLATE 10 MG PO TABS
10.0000 mg | ORAL_TABLET | Freq: Every day | ORAL | Status: DC
  Filled 2015-08-01: qty 1

## 2015-08-01 MED ORDER — BUPIVACAINE HCL 0.5 % IJ SOLN
INTRAMUSCULAR | Status: DC | PRN
Start: 2015-08-01 — End: 2015-08-01
  Administered 2015-08-01: 15 mL
  Administered 2015-08-01: 5 mL

## 2015-08-01 MED ORDER — BISACODYL 5 MG PO TBEC
5.0000 mg | DELAYED_RELEASE_TABLET | Freq: Every day | ORAL | Status: DC | PRN
  Administered 2015-08-03: 5 mg via ORAL
  Filled 2015-08-01: qty 1

## 2015-08-01 MED ORDER — METOCLOPRAMIDE HCL 5 MG PO TABS: 5.0000 mg | ORAL_TABLET | Freq: Three times a day (TID) | ORAL | Status: DC | PRN

## 2015-08-01 MED ORDER — PROMETHAZINE HCL 25 MG/ML IJ SOLN: 6.2500 mg | INTRAMUSCULAR | Status: DC | PRN

## 2015-08-01 MED ORDER — BUPIVACAINE LIPOSOME 1.3 % IJ SUSP
INTRAMUSCULAR | Status: DC | PRN
  Administered 2015-08-01: 5 mL

## 2015-08-01 MED ORDER — SURGIFOAM 100 EX MISC
CUTANEOUS | Status: DC | PRN
Start: 2015-08-01 — End: 2015-08-01
  Administered 2015-08-01: 1 via TOPICAL

## 2015-08-01 MED ORDER — LIDOCAINE HCL (CARDIAC) 20 MG/ML IV SOLN
INTRAVENOUS | Status: DC | PRN
  Administered 2015-08-01: 20 mg via INTRAVENOUS

## 2015-08-01 MED ORDER — DEXMEDETOMIDINE HCL IN NACL 400 MCG/100ML IV SOLN
INTRAVENOUS | Status: DC | PRN
  Administered 2015-08-01: .5 ug/kg/h via INTRAVENOUS

## 2015-08-01 MED ORDER — HEMOSTATIC AGENTS (NO CHARGE) OPTIME
TOPICAL | Status: DC | PRN
  Administered 2015-08-01: 1 via TOPICAL

## 2015-08-01 MED ORDER — PRIMIDONE 50 MG PO TABS
50.0000 mg | ORAL_TABLET | Freq: Two times a day (BID) | ORAL | Status: DC
  Administered 2015-08-01 – 2015-08-04 (×6): 50 mg via ORAL
  Filled 2015-08-01 (×10): qty 1

## 2015-08-01 MED ORDER — THROMBIN 20000 UNITS EX KIT
PACK | CUTANEOUS | Status: AC
  Filled 2015-08-01: qty 1

## 2015-08-01 MED ORDER — ALPRAZOLAM 0.5 MG PO TABS
1.0000 mg | ORAL_TABLET | Freq: Two times a day (BID) | ORAL | Status: DC | PRN
  Administered 2015-08-01 – 2015-08-03 (×3): 1 mg via ORAL
  Filled 2015-08-01 (×3): qty 2

## 2015-08-01 MED ORDER — FENTANYL 50 MCG/HR TD PT72: 50.0000 ug | MEDICATED_PATCH | TRANSDERMAL | Status: DC

## 2015-08-01 MED ORDER — ALBUTEROL SULFATE HFA 108 (90 BASE) MCG/ACT IN AERS: 1.0000 | INHALATION_SPRAY | Freq: Four times a day (QID) | RESPIRATORY_TRACT | Status: DC | PRN

## 2015-08-01 MED ORDER — MECLIZINE HCL 12.5 MG PO TABS: 25.0000 mg | ORAL_TABLET | Freq: Two times a day (BID) | ORAL | Status: DC | PRN

## 2015-08-01 MED ORDER — ACETAMINOPHEN 325 MG PO TABS
650.0000 mg | ORAL_TABLET | Freq: Four times a day (QID) | ORAL | Status: DC | PRN
  Administered 2015-08-01 – 2015-08-03 (×3): 650 mg via ORAL
  Filled 2015-08-01 (×3): qty 2

## 2015-08-01 MED ORDER — QUETIAPINE FUMARATE 50 MG PO TABS
100.0000 mg | ORAL_TABLET | Freq: Every day | ORAL | Status: DC
  Administered 2015-08-02 – 2015-08-04 (×3): 100 mg via ORAL
  Filled 2015-08-01 (×3): qty 2

## 2015-08-01 MED ORDER — CLONAZEPAM 1 MG PO TABS
2.0000 mg | ORAL_TABLET | Freq: Two times a day (BID) | ORAL | Status: DC | PRN
  Administered 2015-08-02 – 2015-08-03 (×2): 2 mg via ORAL
  Filled 2015-08-01 (×2): qty 2

## 2015-08-01 SURGICAL SUPPLY — 78 items
BAG DECANTER FOR FLEXI CONT (MISCELLANEOUS) ×2 IMPLANT
BONE CANC CHIPS 40CC CAN1/2 (Bone Implant) ×2 IMPLANT
BONE MATRIX VIVIGEN 5CC (Bone Implant) ×4 IMPLANT
BUR MATCHSTICK NEURO 3.0 LAGG (BURR) ×2 IMPLANT
CAGE BULLET CONCORDE 9X8X23 (Cage) ×2 IMPLANT
CAGE CONCORDE BULLET 9X7X23 (Cage) ×2 IMPLANT
CHIPS CANC BONE 40CC CAN1/2 (Bone Implant) ×1 IMPLANT
CONNECTOR CROSS A3 SFX (Connector) ×2 IMPLANT
CONNECTOR EXPEDIUM TI 55MM (Connector) ×6 IMPLANT
COVER SURGICAL LIGHT HANDLE (MISCELLANEOUS) ×2 IMPLANT
DERMABOND ADVANCED (GAUZE/BANDAGES/DRESSINGS) ×1
DERMABOND ADVANCED .7 DNX12 (GAUZE/BANDAGES/DRESSINGS) ×1 IMPLANT
DRAPE C-ARM 42X72 X-RAY (DRAPES) ×2 IMPLANT
DRAPE C-ARMOR (DRAPES) ×2 IMPLANT
DRAPE INCISE IOBAN 66X45 STRL (DRAPES) ×2 IMPLANT
DRAPE MICROSCOPE LEICA (MISCELLANEOUS) ×2 IMPLANT
DRAPE SURG 17X23 STRL (DRAPES) ×8 IMPLANT
DRAPE TABLE COVER HEAVY DUTY (DRAPES) ×2 IMPLANT
DRSG MEPILEX BORDER 4X12 (GAUZE/BANDAGES/DRESSINGS) ×2 IMPLANT
DRSG MEPILEX BORDER 4X4 (GAUZE/BANDAGES/DRESSINGS) IMPLANT
DRSG MEPILEX BORDER 4X8 (GAUZE/BANDAGES/DRESSINGS) IMPLANT
DURAPREP 26ML APPLICATOR (WOUND CARE) ×2 IMPLANT
ELECT BLADE 6.5 EXT (BLADE) IMPLANT
ELECT CAUTERY BLADE 6.4 (BLADE) ×2 IMPLANT
ELECT REM PT RETURN 9FT ADLT (ELECTROSURGICAL) ×2
ELECTRODE REM PT RTRN 9FT ADLT (ELECTROSURGICAL) ×1 IMPLANT
EVACUATOR 1/8 PVC DRAIN (DRAIN) IMPLANT
GAUZE SPONGE 4X4 12PLY STRL (GAUZE/BANDAGES/DRESSINGS) ×2 IMPLANT
GLOVE BIO SURGEON STRL SZ 6.5 (GLOVE) ×4 IMPLANT
GLOVE BIO SURGEON STRL SZ7 (GLOVE) ×6 IMPLANT
GLOVE BIO SURGEON STRL SZ7.5 (GLOVE) ×8 IMPLANT
GLOVE BIOGEL PI IND STRL 7.0 (GLOVE) ×3 IMPLANT
GLOVE BIOGEL PI IND STRL 8 (GLOVE) ×1 IMPLANT
GLOVE BIOGEL PI INDICATOR 7.0 (GLOVE) ×3
GLOVE BIOGEL PI INDICATOR 8 (GLOVE) ×1
GLOVE ECLIPSE 9.0 STRL (GLOVE) ×4 IMPLANT
GLOVE ORTHO TXT STRL SZ7.5 (GLOVE) ×6 IMPLANT
GLOVE SURG 8.5 LATEX PF (GLOVE) ×4 IMPLANT
GOWN STRL REUS W/ TWL LRG LVL3 (GOWN DISPOSABLE) ×1 IMPLANT
GOWN STRL REUS W/TWL 2XL LVL3 (GOWN DISPOSABLE) ×8 IMPLANT
GOWN STRL REUS W/TWL LRG LVL3 (GOWN DISPOSABLE) ×1
KIT BASIN OR (CUSTOM PROCEDURE TRAY) ×2 IMPLANT
KIT POSITION SURG JACKSON T1 (MISCELLANEOUS) ×2 IMPLANT
KIT ROOM TURNOVER OR (KITS) ×2 IMPLANT
NEEDLE 22X1 1/2 (OR ONLY) (NEEDLE) ×2 IMPLANT
NEEDLE ASP BONE MRW 11GX15 (NEEDLE) IMPLANT
NEEDLE BONE MARROW 8GAX6 (NEEDLE) ×2 IMPLANT
NEEDLE SPNL 18GX3.5 QUINCKE PK (NEEDLE) ×2 IMPLANT
NS IRRIG 1000ML POUR BTL (IV SOLUTION) ×2 IMPLANT
PACK LAMINECTOMY ORTHO (CUSTOM PROCEDURE TRAY) ×2 IMPLANT
PAD ARMBOARD 7.5X6 YLW CONV (MISCELLANEOUS) ×4 IMPLANT
PATTIES SURGICAL .75X.75 (GAUZE/BANDAGES/DRESSINGS) ×2 IMPLANT
PATTIES SURGICAL 1X1 (DISPOSABLE) ×2 IMPLANT
ROD EXPEDIUM 300MM (Rod) ×4 IMPLANT
SCREW SET SINGLE INNER (Screw) ×10 IMPLANT
SCREW VIPER CORT FIX 4.35X35 (Screw) ×2 IMPLANT
SCREW VIPER CORT FIX 5.00X30 (Screw) ×2 IMPLANT
SCREW VIPER CORT FIX 5.00X35 (Screw) ×2 IMPLANT
SCREW VIPER CORT FIX 6X35 (Screw) ×2 IMPLANT
SCREW VIPER CORTICAL FIX 6X40 (Screw) ×2 IMPLANT
SPONGE LAP 4X18 X RAY DECT (DISPOSABLE) ×4 IMPLANT
SPONGE SURGIFOAM ABS GEL 100 (HEMOSTASIS) ×2 IMPLANT
SURGIFLO W/THROMBIN 8M KIT (HEMOSTASIS) ×2 IMPLANT
SUT VIC AB 0 CT1 27 (SUTURE) ×1
SUT VIC AB 0 CT1 27XBRD ANBCTR (SUTURE) ×1 IMPLANT
SUT VIC AB 1 CTX 36 (SUTURE) ×2
SUT VIC AB 1 CTX36XBRD ANBCTR (SUTURE) ×2 IMPLANT
SUT VIC AB 2-0 CT1 27 (SUTURE) ×2
SUT VIC AB 2-0 CT1 TAPERPNT 27 (SUTURE) ×2 IMPLANT
SUT VIC AB 3-0 X1 27 (SUTURE) ×4 IMPLANT
SUT VICRYL 0 CT 1 36IN (SUTURE) ×4 IMPLANT
SYR 20CC LL (SYRINGE) ×2 IMPLANT
SYR CONTROL 10ML LL (SYRINGE) ×4 IMPLANT
TOWEL OR 17X24 6PK STRL BLUE (TOWEL DISPOSABLE) ×2 IMPLANT
TOWEL OR 17X26 10 PK STRL BLUE (TOWEL DISPOSABLE) ×2 IMPLANT
TRAY FOLEY CATH 16FRSI W/METER (SET/KITS/TRAYS/PACK) ×2 IMPLANT
WATER STERILE IRR 1000ML POUR (IV SOLUTION) ×2 IMPLANT
YANKAUER SUCT BULB TIP NO VENT (SUCTIONS) ×4 IMPLANT

## 2015-08-01 NOTE — H&P (Signed)
Jill Phillips is an 59 y.o. female.    CHIEF COMPLAINT:  Low back pain.  The patient is experiencing pain and discomfort with standing and ambulation.  She has a tendency of scoliosis or list of her spine to the right side.  She was seen in pain management.  The plain radiographs show severe asymmetric collapse of the disks between the L1-2 through L2-3 levels.  This is above her fusion from L3 to sacrum and below her T12-L1 fusion.  She has had some problems with healing of a wound over the right lateral hip associated with the use of a heating pad.  This has gone on to heal.  She had x-rays back in November 2015 of her lumbar spine.  No recent x-rays.  She relates that it feels like her back pain is getting worse with increase in muscle spasm.  She is status post right total knee arthroplasty.  She is experiencing mild discomfort over the anterolateral aspect of the patella.  This pain may relate to findings of her upper lumbar spine at L2-3 and L1-2.  L3 and 4 in particular on the right side as a source of pain in the lower extremity.     ALLERGIES:  Norco, sulfa, Demerol, trazodone, Darvocet, Tylox, Macrobid and Percocet.   PAST SURGICAL HISTORY/HOSPITALIZATION:   No interval hospitalizations since she was last seen.     SOCIAL HISTORY:  She continues to smoke.        Past Medical History  Diagnosis Date  . Postlaminectomy syndrome, thoracic region   . Osteoarthrosis, unspecified whether generalized or localized, lower leg   . Dysthymic disorder   . Calcifying tendinitis of shoulder   . Pain in joint, upper arm   . Chronic pain syndrome   . Lumbago   . Primary localized osteoarthrosis, lower leg   . Hypertension   . Hyperlipidemia   . GERD (gastroesophageal reflux disease)   . Thyroid disease   . Restless leg syndrome   . History of blood transfusion 1980  . Hypothyroidism   . Active smoker   . COPD (chronic obstructive pulmonary disease) ([place])     on nocturnal home o2  . Heart  murmur     for years, nothing to be concerned about  . Sleep apnea     s/p surgery- last sleep study 2011- doesnt use oxygen or machine at night as instructed,.   12/2014- Dr Halford Chessman  reports it is negative.  [name] Anxiety   . Herpes genitalia   . PONV (postoperative nausea and vomiting)     gets nauseous  with longer surgery. Difficuty voiding after surgery    Past Surgical History  Procedure Laterality Date  . Appendectomy    . Abdominal hysterectomy    . Tubal ligation    . Spine surgery      thoracic x 1,  lumbar x 15  . Right hip replacement    . Knee surgeries r knee      arthroscopy- right  . Hammer toe surgery    . Sleep apnea surgery    . Cholecystectomy    . Total knee arthroplasty  05/29/2012    Procedure: TOTAL KNEE ARTHROPLASTY;  Surgeon: Mcarthur Rossetti, MD;  Location: [facility];  Service: Orthopedics;  Laterality: Right;  Right Total Knee Arthroplasty  . Lumbar laminectomy/decompression microdiscectomy N/A 01/28/2014    Procedure: Minimally Invasive Right  L1-2 Microdiscectomy;  Surgeon: [name], MD;  Location: [place];  Service: Orthopedics;  Laterality: N/A;  . Joint replacement    . Back surgery      16 back surgeries    Family History  Problem Relation Age of Onset  . Kidney disease Mother   . Heart disease Father   . Anuerysm Brother 29    brain  . Heart disease Brother   . Heart disease Sister 77    s/p CABG  . Hypertension Sister    Social History:  reports that she has been smoking Cigarettes.  She has a 56.25 pack-year smoking history. She has never used smokeless tobacco. She reports that she does not drink alcohol or use illicit drugs.  Allergies:  Allergies  Allergen Reactions  . Amoxicillin Other (See Comments)    REACTION: Oral yeast infection  . Chlorzoxazone Other (See Comments)    REACTION: headache  . Codeine Other (See Comments)    REACTION: headache  . Darvocet [Propoxyphene N-Acetaminophen] Itching  . Dilaudid [Hydromorphone  Hcl] Itching  . Flagyl [Metronidazole] Diarrhea  . Keflex [Cephalexin] Other (See Comments)    Pt does not recall reaction (maybe yeast infection)  . Morphine And Related Itching  . Nitrofurantoin Monohyd Macro Hives    Reaction to [employer]  . Percocet [Oxycodone-Acetaminophen] Itching    Patient can tolerate Acetaminophen solely  . Sulfa Antibiotics Other (See Comments)    REACTION: Yeast infection in mouth    No prescriptions prior to admission    No results found for this or any previous visit (from the past 48 hour(s)). No results found.  Review of Systems  Constitutional: Negative.   HENT: Negative.   Respiratory: Negative.   Cardiovascular: Negative.   Gastrointestinal: Negative.   Genitourinary: Negative.   Musculoskeletal: Positive for back pain.  Skin: Negative.   Psychiatric/Behavioral: Negative.     There were no vitals taken for this visit. Physical Exam  Constitutional: She is oriented to person, place, and time. No distress.  HENT:  Head: Atraumatic.  Eyes: EOM are normal.  Neck: Normal range of motion.  Cardiovascular: Normal rate.   Respiratory: No respiratory distress.  GI: She exhibits no distension.  Musculoskeletal: She exhibits tenderness.  Neurological: She is alert and oriented to person, place, and time.  Skin: Skin is warm and dry.  Psychiatric: She has a normal mood and affect.     RADIOGRAPHS:  An AP radiograph of the lumbar spine shows scoliosis that is apparent at the intervening segments from L1 to L3 and previous fusion from L3 to sacrum.  The presence of an interbody cage at the L3-4 level.  Posterolateral fusion mass is abundant from L4 to sacrum on both sides.  Right-sided hip hemiarthroplasty in good position and alignment on the single view here.  Her fixation at T12-L1 appears intact.  The lateral radiograph of the lumbar spine shows relatively flat back changes occurring at the L3 to sacrum levels with almost no lordosis through  these areas.  She has narrowing of the disk space at L2-3 and at L1-2.  L1-2 with generous spurs evident here.  Anteriorly endplate sclerosis evident on both sides of the disk space.  At the L1-L2 level degenerative disk narrowing present.  No listhesis noted at any levels.  Some mild loss of lordosis throughout the lumbar spine associated with previous fusions from L3 to S1 and over the areas in the interval between T12 and L3 due to anterior column loss of height.     ASSESSMENT:  The pain is severe with radiation into both  buttocks and thighs.  I discussed previously with the patient extending her fusion in the hopes of restoring alignment of her spine and also improving her lordosis, which may take away some of her tendency to stoop.  This will require elevating the disk height at both the L2-3 and L1-2 levels and try and restore some degree of lordosis here.  The risks of surgery would include the risk of infection, bleeding and neurovascular compromise.     PLAN:  I think that it would be reasonable to go ahead and schedule her for intervention and postoperative use of the brace, TLSO, for a total of three months postoperative.  The surgery would include TLIFs on the right side at L1-2 and L2-3.  Posterior fusion across the upper lumbar segments extending her fusion then between T12-L1 and L3 to sacrum.  She wishes to proceed with the intervention.  She will continue to receive her narcotic medicines through Dr. Naaman Plummer in pain management at [facility].  OWENS,Shea Kapur M 08/01/2015, 5:01 AM   Patient was seen and examined in the preop holding area. There has been no interval  Change in this patient's exam preop  history and physical exam  Lab tests and images have been examined and reviewed.  The Risks benefits and alternative treatments have been discussed  extensively,questions answered.  The patient has elected to undergo the discussed surgical treatment.

## 2015-08-01 NOTE — Anesthesia Postprocedure Evaluation (Signed)
Anesthesia Post Note  Patient: Jill Phillips  Procedure(s) Performed: Procedure(s) (LRB): Right sided L1-2 and L2-3 transforaminal lumbar interbody fusion with cages, Extension of posterior fusion T12 to L3, Replaced pedicle screws bilaterally L1-L2 , Replaced left sided pedicle screws L-3. Instrumentation T12 to L3 using local bone graft, Vivigen allograft and cancellous chips (N/A)  Anesthesia Post Evaluation  Last Vitals:  Filed Vitals:   08/01/15 1630 08/01/15 1723  BP: 120/73 139/80  Pulse: 88 87  Temp: 37.1 C 37.1 C  Resp: 23 20    Last Pain:  Filed Vitals:   08/01/15 1728  PainSc: Asleep    LLE Motor Response: Purposeful movement, Responds to commands LLE Sensation: No numbness, No pain, No tingling RLE Motor Response: Purposeful movement, Responds to commands RLE Sensation: No numbness, No tingling, No pain      Michale Weikel COKER

## 2015-08-01 NOTE — Anesthesia Procedure Notes (Signed)
Procedure Name: Intubation Date/Time: 08/01/2015 7:42 AM Performed by: Shirlyn Goltz Pre-anesthesia Checklist: Patient identified, Emergency Drugs available, Suction available and Patient being monitored Patient Re-evaluated:Patient Re-evaluated prior to inductionOxygen Delivery Method: Circle system utilized Preoxygenation: Pre-oxygenation with 100% oxygen Intubation Type: IV induction Ventilation: Mask ventilation without difficulty Laryngoscope Size: Mac and 3 Grade View: Grade I Tube type: Oral Tube size: 7.0 mm Number of attempts: 1 Airway Equipment and Method: Stylet Placement Confirmation: ETT inserted through vocal cords under direct vision,  positive ETCO2 and breath sounds checked- equal and bilateral Secured at: 22 cm Tube secured with: Tape Dental Injury: Teeth and Oropharynx as per pre-operative assessment

## 2015-08-01 NOTE — Anesthesia Preprocedure Evaluation (Addendum)
Anesthesia Evaluation  Patient identified by MRN, date of birth, ID band Patient awake    Reviewed: Allergy & Precautions, NPO status , Patient's Chart, lab work & pertinent test results  History of Anesthesia Complications (+) PONV and history of anesthetic complications  Airway Mallampati: II  TM Distance: >3 FB Neck ROM: Full    Dental  (+) Edentulous Upper, Poor Dentition, Chipped, Missing, Dental Advisory Given   Pulmonary COPD,  COPD inhaler, Current Smoker,    breath sounds clear to auscultation       Cardiovascular hypertension, Pt. on medications (-) angina Rhythm:Regular Rate:Normal     Neuro/Psych Anxiety Depression Chronic back pain: narcotics daily    GI/Hepatic Neg liver ROS, GERD  Medicated and Poorly Controlled,  Endo/Other  Hypothyroidism   Renal/GU Renal InsufficiencyRenal disease (creat 1.34)     Musculoskeletal  (+) Arthritis , Osteoarthritis,    Abdominal   Peds  Hematology negative hematology ROS (+)   Anesthesia Other Findings   Reproductive/Obstetrics                            Anesthesia Physical Anesthesia Plan  ASA: III  Anesthesia Plan: General   Post-op Pain Management:    Induction: Intravenous  Airway Management Planned: Oral ETT  Additional Equipment:   Intra-op Plan:   Post-operative Plan: Extubation in OR  Informed Consent: I have reviewed the patients History and Physical, chart, labs and discussed the procedure including the risks, benefits and alternatives for the proposed anesthesia with the patient or authorized representative who has indicated his/her understanding and acceptance.   Dental advisory given  Plan Discussed with: CRNA and Surgeon  Anesthesia Plan Comments: (Plan routine monitors, GETA)        Anesthesia Quick Evaluation

## 2015-08-01 NOTE — Progress Notes (Signed)
During pain assessment patient asked level of pain and only expresses that it hurts really bad while falling back to sleep. Patient oriented to person,place time and situation; able to follow commands.

## 2015-08-01 NOTE — Brief Op Note (Signed)
08/01/2015  2:31 PM  PATIENT:  Jill Phillips  59 y.o. 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 Instrumentation T12 to L3 using local bone graft, Vivigen allograft and cancellous chips (N/A)  SURGEON:  Surgeon(s) and Role:    [name], MD - Primary  PHYSICIAN ASSISTANT:Clinten Howk Ricard Dillon.PA-C  ANESTHESIA:   local and general, Dr. Adele Schilder. [place]  EBL:  Total I/O In: 3150 [I.V.:2500; Blood:150; IV Piggyback:500] Out: 755 [Urine:355; Blood:400]  BLOOD ADMINISTERED:150 CC CELLSAVER  DRAINS: Urinary Catheter (Foley)   LOCAL MEDICATIONS USED:  MARCAINE 0.5% 1:1 EXPAREL 1.3%    and Amount: 40 ml  SPECIMEN:  No Specimen  DISPOSITION OF SPECIMEN:  N/A  COUNTS:  YES  TOURNIQUET:  * No tourniquets in log *  DICTATION: .Dragon Dictation  PLAN OF CARE: Admit to inpatient   PATIENT DISPOSITION:  PACU - hemodynamically stable.   Delay start of Pharmacological VTE agent (>24hrs) due to surgical blood loss or risk of bleeding: yes

## 2015-08-01 NOTE — Progress Notes (Signed)
Pt to drowsy to administer any medication by mouth. Pt states that she cannot take anything by mouth at this time. She was repositioned for comfort. When speaking with her she falls in and out of sleep. PACU nurse stated that he administered pain medication prior to pt arriving to unit and she was drowsy. No noted distress. Will continue to monitor.

## 2015-08-01 NOTE — Discharge Instructions (Signed)
° ° °  Call if there is increasing drainage, fever greater than 101.5, severe head aches, and °worsening nausea or light sensitivity. °If shortness of breath, bloody cough or chest tightness or pain go to an emergency room. °No lifting greater than 10 lbs. °Avoid bending, stooping and twisting. °Use brace when sitting and out of bed even to go to bathroom. °Walk in house for first 2 weeks then may start to get out slowly increasing distances up to one quarter mile by 4-6 weeks post op. °After 5 days may shower and change dressing following bathing with shower.When °bathing remove the brace shower and replace brace before getting out of the shower. °If drainage, keep dry dressing and do not bathe the incision, use an moisture impervious dressing. °Please call and return for scheduled follow up appointment 2 weeks from the time of surgery. ° ° °

## 2015-08-01 NOTE — Progress Notes (Signed)
Pt arrived to unit drowsy with no noted distress. Pt asleep, alert to voice.  Surgical dressing intact.  Able to follow simple commands. Foley in place patent draining clear yellow urine. Pt oriented to room. Safety measures in place. Daughter at bedside. Call bell within reach. Brace delivered from [employer]. Will continue to monitor.

## 2015-08-01 NOTE — Op Note (Signed)
08/01/2015  3:44 PM  PATIENT:  Jill Phillips  59 y.o. 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    SURGEON:  [name], MD     ASSISTANT:  Benjiman Core, PA-C  (Present throughout the entire procedure and necessary for completion of procedure in a timely manner)     ANESTHESIA:  General,    COMPLICATIONS:  None.  EBL: 400cc  BLOOD: 150cc cell saver blood.  DRAINS: Foley to SD.     COMPONENTS:  Implant Name Type Inv. Item Serial No. Manufacturer Lot No. LRB No. Used  BONE MATRIX VIVIGEN 5CC - [id number] Bone Implant BONE MATRIX VIVIGEN 5CC [id number] [facility]  N/A 1  BONE MATRIX VIVIGEN 5CC - [id number] Bone Implant BONE MATRIX VIVIGEN 5CC [id number] [facility]  N/A 1  SCREW VIPER CORT FIX 5.00X35 - [id number] Screw SCREW VIPER CORT FIX 5.00X35  DEPUY SPINE  N/A 1  SCREW VIPER CORTICAL FIX 6X40 - [id number] Screw SCREW VIPER CORTICAL FIX 6X40  DEPUY SPINE  N/A 1  SCREW VIPER CORT FIX 4.35X21 - [id number] Screw SCREW VIPER CORT FIX 4.35X21  DEPUY SPINE  N/A 1  BONE CANC CHIPS 40CC - [id number] Bone Implant BONE CANC CHIPS 40CC [id number] [facility]   N/A 1  SCREW VIPER CORT FIX 4.35X35 - [id number] Screw SCREW VIPER CORT FIX 4.35X35  DEPUY SPINE  N/A 1  SCREW VIPER CORT FIX 5.00X30 - [id number] Screw SCREW VIPER CORT FIX 5.00X30  DEPUY SPINE  N/A 1  CAGE BULLET CONCORDE 9X8X23 - [id number] Cage CAGE BULLET CONCORDE 9X8X23  DEPUY SPINE  N/A 1  SCREW SET SINGLE INNER - [id number] Screw SCREW SET SINGLE INNER  DEPUY SPINE  N/A 5  ROD EXPEDIUM 300MM - [id number] Rod ROD EXPEDIUM 300MM  DEPUY SPINE  N/A 2  CAGE CONCORDE BULLET 9X7X23MM - [id number] Cage CAGE CONCORDE BULLET 9X7X23MM  DEPUY SPINE  N/A 1  CONNECTOR EXPEDIUM 5.5X5.5-V - [id number] Connector CONNECTOR EXPEDIUM 5.5X5.5-V  DEPUY SPINE  N/A 3  A3 crosslink connector Connector     SYNTHES SPINE   N/A 1     PROCEDURE:    The patient was met in the holding area, and the appropriate Right L1-2 and L2-3 lumbar levels identified and marked with an "X" and my initials. I had discussion with the patient in the preop holding area regarding consent form. Patient understands the rationale for the fusion site as the L1-2 and L2-3 segment For stenosis and degenerative spondylolisthesis above previous fusion L3 to L5 and below T11-12 fusion with segmental degenerative scoliosis, apex to left at L2..  The patient was then transported to OR and was placed under general anestheticwithout difficulty. The patient received appropriate preoperative antibiotic prophylaxis vancomycin for penicillin allergy. Nursing staff inserted a Foley catheter under sterile conditions. The patient was then  turned to a prone position using the [place] spine frame. PAS. all pressure points well padded the arms at the side to 90 90. Standard prep with DuraPrep solution draped in the usual manner from the lower dorsal spine the mid sacral segment. Iodine Vi-Drape was used and the incision was marked. Time-out procedure was called and correct. Loupe magnification and headlight were used during this portion procedure.  Skin incision in the  midline between T10 and L4 was then infiltrated with local marcaine 0.5% 1:1 exparel 1.3% total of 30 cc used. Incision was then made through the skin and subcutaneous layers down to the patient's lumbodorsal fascia and spinous processes. The incision then carried sharply excising the supraspinous ligament and then continuing the lateral aspect of the spinous processesT11,T12,L1 and L2 and carried lateral over the lateral facets at L2-3 and L4. Cobb elevators used to carefully elevate the paralumbar muscles off of the posterior elements using electrocautery carefully drilled bleeding and perform dissection of the muscle tissues of the preserving the facet at T10-11. Continuing the exposure out laterally to expose the retained pedicle screws and rods at T11-12 and right L3-4. Bleeding controlled using electrocautery monopolar electrocautery.  Cerebellar retractor was used for the upper part of the incision.  C-arm fluoroscopy was then brought into the field and using C-arm fluoroscopy then a hole made into the medial aspect of the left pedicle of L1 observed in the pedicle using ball-tipped probe initial entry was determined on fluoroscopy to be good position alignment so that a ball handled probe was then used to probe the left L1 pedicle to a depth of nearly 35 mm observed on C-arm fluoroscopy to be beyond the midpoint of the lumbar vertebra and then position alignment within the left L1 pedicle this was then removed and the pedicle channel probed demonstrating patency no sign of rupture the cortex of the pedicle. Tapping with a 4.35 mm screw tap then 5.0 mm screw tap, then a 5 mm x 35 mm screw was placed on the left side at the L1. C-arm fluoroscopy was then brought into the field and using C-arm fluoroscopy then a hole made into the medial aspect of the right pedicle of L1 observed in the pedicle using ball tipped nerve hook and hockey stick nerve probe initial entry was determined on fluoroscopy to be good  position alignment so that a ball handled probe was then used to probe the right L1 pedicle opening to a depth of nearly 35 mm observed on C-arm fluoroscopy to be beyond the midpoint of the lumbar vertebra and then position alignment within the right L1 pedicle this was then removed and the pedicle channel probed demonstrating patency no sign of rupture the cortex of the pedicle. Tapping with a 4.35 mm screw tap 4.76m x 30 mm screw was placed on the table for use at the right side at the L1 level following TLIF.   C-arm fluoroscopy was then brought into the field and using C-arm fluoroscopy then a hole made into the medial aspect of the left pedicle of L2 observed in the pedicle using ball tipped probe initial entry was determined on fluoroscopy to be good position alignment so that a ball handled probe was then used to probe the left L2 pedicle to a depth of nearly 40 mm observed on C-arm fluoroscopy to be well aligned within the left L2 pedicle, the pedicle channel probed demonstrating patency no sign of rupture the cortex of the pedicle. Tapping with a 4.35 mm  screw tap, then a 5.75m tap and  Then a 6.0 mm tap. A 6.0 mm x 35 mm screw was placed on the the left side at the L2 level following TLIF. C-arm fluoroscopy was used to localize the hole made in the medial aspect of the pedicle of L2 on the right localizing the pedicle with a ball tipped probe carefully passed down the center of the right L2 pedicle to a depth of nearly 30 mm. Observed on C-arm fluoroscopy to be in good position alignment channel was probed with a ball-tipped probe ensure patency no sign of cortical disruption. Following tapping with a 4.35 mm tap a 4.35 mm x 30 mm screw was placed  on the table for use on the right side at the L2 level following TLIF.  Using C-arm fluoroscopy then a hole made into the medial aspect of the left pedicle of L3 observed in the pedicle using ball tipped probe initial entry was determined on fluoroscopy to  be good position alignment so that a ball handled probe was then used to probe the left L3 pedicle to a depth of nearly 35 mm observed on C-arm fluoroscopy to be well aligned within the left L3 pedicle, the pedicle channel probed demonstrating patency no sign of rupture the cortex of the pedicle. Tapping with a 4.35 mm screw tap, then a 5.051mtap and then a 6.0 mm tap. A 6.0 mm x 35 mm screw was placed on the the left side at the L3 level.  The right inferior articular process of L1and L2 were resected in order to provide for exposure of the right side L1-2 and L2-3 neuroforamen for ease of placement of TLIFs (transforaminal lumbar interbody fusion) essential portions of the lamina were also resected first beginning with the Leksell rongeur and then resecting using 2 and 3 mm Kerrison the central and right portions of the lamina of L1 and L2 performing foraminotomies on the right side at the L3 level. The inferior articular process L1 and L2 were resected on the right side. The L3 nerve root identified bilaterally and the medial aspect of the L3 pedicle. Superior articular process of L3 was then resected from the right side further decompressing the right L3 nerve and providing for exposure of the area just superior to the L3 pedicle for a placement of cage. The OR microscope was draped sterilely and brought into the field to allow for freeing up of the right side of the thecal sac at the L2-3 Level elevating the L3 nerve root away from the medial right L3 pedicle, exposing the right L2-3 disc above the right L3 pedicle, Incising the disc and then resecting the disc with micropituitary and then pituitary with teeth.The right side recut resecting the superior articular process of L3 overlying the L2 nerve root as it exited at the L2-3 level decompressing the lateral recess along the medial aspect of the pedicle of L3 and resecting the superior to the process of L3 and decompressing the right L2 neuroforamen.The  disc herniation did continue subligamentous along the right side of the Posterior upper vertebral body of L3 ventral to the right L3 nerve root. The posterior longitudinal ligament was incised and the disc herniation material resected with nerve hook and micropituitary. A blunt nerve hook used to explore the spinal canal and ensure Both the right L3 and L2 nerve roots were well decompressed.At the L2 level similarly lateral recess and the neuroforamen were resected decompressing the L1and L2 nerve roots and foraminotomies  was widely performed over the right L1 nerve and L2 nerve roots.  Attention then turned to placement of the right transforaminal lumbar interbody fusion cages. Using a Penfield 4 the lateral aspect of the thecal sac and the inferior aspect of the L3 nerve root on the right side at the L2-3 level was carefully drilled. The thecal sac could then easily be retracted in the posterior lateral aspect of the L2-3 disc was exposed 15 blade scalpel used to incise the osteotome used to resect a small portion of bone off the superior aspect of the posterior superior vertebral body of L2 in order to ease the entry into the L2-3 disc space. A pituitary rongeur was then able to be introduced in the disc space debrided it of degenerative disc material. 7 mm dilator was used to dialate  the L2-3 disc space on the right side attempts were made to dilate further in increments to 65m successfully and using small curettes and the disc space was debrided a minimal degenerative disc present in the endplates debrided to bleeding endplate bone.Trial cage placement within the disc place could only allow for an 8 mm cage. An 8 mm x 9 mm x 23 mm lordotic Concorde cage was carefully packed with morcellized local bone graft that had been harvested from previous laminotomies additional bone graft local morselized, cancellous allograft chips and vivigen was then packed into the intervertebral disc space using the 7 mm trial  to impacted the graft multiple times. With this then a 8 mm x 23 mm lorditic cage was introdudced into the disc space on the right side in the correct degree convergence and then impacted then subset beneath the posterior aspect of the disc space by about 3 or 4 mm. Bleeding controlled using bipolar electrocautery thrombin soaked gel cottonoids. Then turned to the right L1-2 level similarly the exposure the posterior lateral aspect this was carried out using a Penfield 4 bipolar electrocautery to control small bleeders present. Derricho retractor used to retract the thecal sac and nerve root, a 15 blade scalpel was used to incise posterior lateral aspect of the disc at the L1-2 disc space.The space was debrided of degenerative disc material using pituitary along root the entire disc space was then debrided of degenerative disc material using pituitary rongeurs curettage down to bleeding bone endplates. Residual disc was resected using pituitary. This space was then carefully sounded to a 7 mm cage trial provided the best fit. The parallel cage was chosen the 7 mm x 9 mm x 23 mm. The intervertebral disc space was then packed with autogenous local bone graft that been harvested from the central laminectomy, cancellous allograft chips and vivigen and the trial was used to pack the graft using an 751mtrial cage. This provided excellent bone graft within the intervertebral disc space at L1-2 so that the permanent 7 mm cage by 23 mm was then packed with local bone graft cancellous allograft chips and vivigen  placed into the intervertebral disc space and impacted into place in the correct degree of convergence. Bleeding controlled using bipolar electrocautery. The cage was subset beneath the posterior aspect of the disc space by about 3-4 mm.   Bleeding was hemostasis attention was turned to the placement of the rods and Rod sleeves. The exposed retained hardware bilateral T11-12 pedicle screw fasteners and rods and  the retained right L3-4 pedicle screw and rod there was enough space between the fasteners at right L3-L4 and both T11-12 to allow for placement  of a connector sleeves so the With the transforaminal lumbar interbody fusion portion of the case completed bleeders were controlled using bipolar electrocautery thrombin-soaked Gelfoam were appropriate. The 2  Pedicle screws on the right and then each carefully placed and aligned to allow for placement of rods. The 335m rod was cut then contoured using a template on the left side and placed into the pedicle screws on the left extending from L3 pedicle screw fastener into each of the left L2 and L1 pedicle fastener and caps carefully placed loosely tightened with the superior aspect of the rod placed into the connector rod sleeve attached to the T11-12 retained rod. Attention turned to the right side were similarly screws were carefully adjusted to allow for a better pattern screws to allow for placement of fixation rod template for the rod was then taken and a cut portion of the 300 mm rod was contoured and then was placed. The rod placed into the connector sleeve at the L3-4 level and reduced into the right L2 and L1 pedicle fasteners and then into the T11-12 connector sleeve. Both rod connector sleeve screws at T11-12 were tightened to 80 foot lbs. The right L1 rod fastener cap was then tightened 85 pounds similarly while compressing between the fastener of T12 and L1.  The right side disc space at L1-2 screw fasteners compress to compress the L1-2 disc posteriorly and the L2 fastener cap was torqued to 85 foot lbs. Similarly this was done on the left side at T12-L1 and L1-2 sleeve screws were compressed and tightened 85 pounds. The compressor was then used between the fastener of L2 on the right side with the pedicle screw fastener L3 compressing the disc space at L2-3 and then using the torque screwdriver to torque the At the L3-4 level and this basically was  tightening the screws to the rod at the rod connector fastened to the rod at the L3-4 level on the right side. On the left side compression between the fasteners of L2 and L3's pedicle screw was obtained and then the L3 pedicle screw cap was tightened 85 foot pounds. Careful inspection then was made of the right-sided TLIF hemilaminectomy sites ensuring that there was no bone fragments within the foramen from both the L1 and L2 nerve roots nor over the posterior aspect of the disc or within the foramen for L2 and L3.  Permanent C-arm images obtained in AP, LAT and OBLIQUE views.  A portion of the spinous process at the L1 level was then resected superiorly in order to allow for placement of a cross-link measured to be in A-3 size. Leksell rongeur was used to remove bone from the spinous process down to its base and then cross-link was then used to attach the left and right rods at the superior aspect of L1. Both right and left hooks placed and these were torqued to the 5 foot pounds and then the central adjustment of the tightened to 85 foot pounds. Decortication was then carried out of the posterior aspect of the posterior fusion T11-T12 and carried down to the L1 then over the left side of the residual lamina of L2 and the facets on the left side at L2-3 and L1-2 decorticating the facets and the left side at L1-2 and L2-3. Right-sided facets at L1-2 and L2-3 had been resected previously so that the thrombin-soaked Gelfoam was placed over the laminotomy areas and following decortication of the spinous process and posterior aspect of the lamina at L1  bone graft was then applied posteriorly extending from T12-L3 combination of local bone graft, cancellus allograft chips and vivigen place. Irrigation was carried out with copious amounts of saline solution this was done throughout the case. Cell Saver was used during the case.Permanent C-arm images were obtained in AP and lateral and oblique planes. Remaining local  bone graft was then applied along the left and right lateral posterior lateral region extending from L1 through L3 posterior facets following decortication of the facets and along the left L1-2 interlaminar area posteriorly. Excess Gelfoam was then removed the lumbodorsal musculature carefully exam debrided of any devitalized tissue following removal of  retractors were the bleeders were controlled using electrocautery. Vancomycin powder 500 mg then sprinkled over the incision from T10 to L3.  The area dorsal lumbar muscle were then approximated in the midline with interrupted #1 Vicryl sutures loose the dorsal fascia was reattached to itself at T11, T12, L1, L2 and L3 to superiorly and  inferiorly this was done with #1 Vicryl sutures. The para lumbar muscle approximated over the lower incision site with #1 vicryl.The subcutaneous layers then approximated using interrupted 0 Vicryl sutures and 2-0 Vicryl sutures. Skin was closed with a running subcutaneous stitch of 4-0 Vicryl Dermabond was applied then MedPlex bandage. All instrument and sponge counts were correct. The patient was then returned to a supine position on her bed reactivated extubated and returned to the recovery room in satisfactory condition.    Benjiman Core, PA-C perform the duties of assistant surgeon during this case. He was present from the beginning of the case to the end of the case assisting in transfer the patient from his stretcher to the OR table and back to the stretcher at the end of the case. Assisted in careful retraction and suction of the laminectomy site delicate neural structures operating under the operating room microscope. He performed closure of the incision from the fascia to the skin applying the dressing.      Dealva Lafoy E  08/01/2015, 3:44 PM

## 2015-08-01 NOTE — Transfer of Care (Signed)
Immediate Anesthesia Transfer of Care Note  Patient: Jill Phillips  Procedure(s) Performed: Procedure(s): Right sided L1-2 and L2-3 transforaminal lumbar interbody fusion with cages, Extension of posterior fusion T12 to L3, Replaced pedicle screws bilaterally L1-L2 , Replaced left sided pedicle screws L-3. Instrumentation T12 to L3 using local bone graft, Vivigen allograft and cancellous chips (N/A)  Patient Location: PACU  Anesthesia Type:General  Level of Consciousness: awake, alert , oriented and patient cooperative  Airway & Oxygen Therapy: Patient Spontanous Breathing and Patient connected to nasal cannula oxygen  Post-op Assessment: Report given to RN, Post -op Vital signs reviewed and stable, Patient moving all extremities and Patient moving all extremities X 4  Post vital signs: Reviewed and stable  Last Vitals:  Filed Vitals:   08/01/15 0633  BP: 146/71  Pulse: 87  Temp: 37 C  Resp: 18    Complications: No apparent anesthesia complications

## 2015-08-01 NOTE — Interval H&P Note (Signed)
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 After consideration of risks, benefits and other options for treatment, the patient has consented to  Procedure(s): Right L1-2 and L2-3 transforaminal lumbar interbody fusion, Revision of posterior fusion T12 to L1, right L3-4 pedicle screws and rods, Instrumentation T12 to L3 (4 segments), local bone graft, Vivigen allograft cancellous chips (N/A) as a surgical intervention .  The patient's history has been reviewed, patient examined, no change in status, stable for surgery.  I have reviewed the patient's chart and labs.  Questions were answered to the patient's satisfaction.     NITKA,JAMES E

## 2015-08-02 ENCOUNTER — Inpatient Hospital Stay (HOSPITAL_COMMUNITY): Payer: Medicare Other

## 2015-08-02 ENCOUNTER — Encounter (HOSPITAL_COMMUNITY): Payer: Self-pay | Admitting: Specialist

## 2015-08-02 LAB — COMPREHENSIVE METABOLIC PANEL
ALT: 14 U/L (ref 14–54)
AST: 25 U/L (ref 15–41)
Albumin: 3.5 g/dL (ref 3.5–5.0)
Alkaline Phosphatase: 80 U/L (ref 38–126)
Anion gap: 7 (ref 5–15)
BUN: 11 mg/dL (ref 6–20)
CO2: 27 mmol/L (ref 22–32)
Calcium: 7.9 mg/dL — ABNORMAL LOW (ref 8.9–10.3)
Chloride: 105 mmol/L (ref 101–111)
Creatinine, Ser: 1.12 mg/dL — ABNORMAL HIGH (ref 0.44–1.00)
GFR calc Af Amer: >60 mL/min (ref >60)
GFR calc non Af Amer: 52 mL/min — ABNORMAL LOW (ref >60)
Glucose, Bld: 112 mg/dL — ABNORMAL HIGH (ref 65–99)
Potassium: 3.6 mmol/L (ref 3.5–5.1)
Sodium: 139 mmol/L (ref 135–145)
Total Bilirubin: 0.6 mg/dL (ref 0.3–1.2)
Total Protein: 5.5 g/dL — ABNORMAL LOW (ref 6.5–8.1)

## 2015-08-02 LAB — URINALYSIS, ROUTINE W REFLEX MICROSCOPIC
Bilirubin Urine: NEGATIVE
Glucose, UA: NEGATIVE mg/dL
Ketones, ur: NEGATIVE mg/dL
Leukocytes, UA: NEGATIVE
Nitrite: NEGATIVE
Protein, ur: NEGATIVE mg/dL
Specific Gravity, Urine: 1.011 (ref 1.005–1.030)
pH: 7.5 (ref 5.0–8.0)

## 2015-08-02 LAB — URINE MICROSCOPIC-ADD ON

## 2015-08-02 MED ORDER — FENTANYL 50 MCG/HR TD PT72
50.0000 ug | MEDICATED_PATCH | TRANSDERMAL | Status: DC
  Administered 2015-08-02: 50 ug via TRANSDERMAL
  Filled 2015-08-02: qty 1

## 2015-08-02 MED ORDER — ALBUTEROL SULFATE (2.5 MG/3ML) 0.083% IN NEBU
2.5000 mg | INHALATION_SOLUTION | Freq: Four times a day (QID) | RESPIRATORY_TRACT | Status: DC
Start: 2015-08-02 — End: 2015-08-02
  Administered 2015-08-02 (×3): 2.5 mg via RESPIRATORY_TRACT
  Filled 2015-08-02 (×3): qty 3

## 2015-08-02 MED ORDER — ALBUTEROL SULFATE (2.5 MG/3ML) 0.083% IN NEBU
2.5000 mg | INHALATION_SOLUTION | Freq: Two times a day (BID) | RESPIRATORY_TRACT | Status: DC
  Administered 2015-08-03 – 2015-08-04 (×3): 2.5 mg via RESPIRATORY_TRACT
  Filled 2015-08-02 (×3): qty 3

## 2015-08-02 MED FILL — Sodium Chloride IV Soln 0.9%: INTRAVENOUS | Qty: 2000 | Status: AC

## 2015-08-02 MED FILL — Heparin Sodium (Porcine) Inj 1000 Unit/ML: INTRAMUSCULAR | Qty: 30 | Status: AC

## 2015-08-02 NOTE — Progress Notes (Signed)
Patient with elevated temp of 102.7 oral. Patient noted always sleeping and if awake, she appears to be moaning and grimacing, Throughout shift, Staffs has been encourage patient to get OOB for meals but refuse. ICS also encourage, temp decrease to 99.1 orally. Will continue to monitor.   Ave Filter, RN

## 2015-08-02 NOTE — Evaluation (Signed)
Occupational Therapy Evaluation Patient Details Name: Jill Phillips MRN: [id number] DOB: Feb 15, 1956 Today's Date: 08/02/2015    History of Present Illness Patient is a 59 y/o female admitted due to back pain with h/o previous back fusions.  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 Patient reports being independent PTA. Patient currently functioning at set up A for UB self care and max A for LB self care.Min A for functional transfers with use of RW. Patient will benefit from acute OT to increase overall independence in the areas of ADLs, functional mobility, and safety in order to safely discharge.    Follow Up Recommendations  SNF;Home health OT;Supervision/Assistance - 24 hour;Other (comment) (would like to discharge home with daughter as she lives alone or may need SNF with rehab)    Equipment Recommendations  None recommended by OT    Recommendations for Other Services       Precautions / Restrictions Precautions Precautions: Fall;Back Required Braces or Orthoses: Spinal Brace Spinal Brace: Thoracolumbosacral orthotic;Applied in sitting position Restrictions Weight Bearing Restrictions: No      Mobility Bed Mobility Overal bed mobility: Needs Assistance Bed Mobility: Supine to Sit;Sit to Supine;Rolling Rolling: Supervision Sidelying to sit: Min assist;Mod assist;HOB elevated Supine to sit: Mod assist Sit to supine: Min assist   General bed mobility comments: heavy reliance on rail with increased time to complete task. Pt needing increased cues to maintain back precautions with mobility.   Transfers Overall transfer level: Needs assistance Equipment used: Rolling  walker (2 wheeled) Transfers: Sit to/from Stand Sit to Stand: Min assist Stand pivot transfers: Min assist       General transfer comment: cues for hand placement, lifting and lowering assist about 15-20%    Balance Overall balance assessment: Needs assistance Sitting-balance support: Feet supported Sitting balance-Leahy Scale: Fair     Standing balance support: Bilateral upper extremity supported Standing balance-Leahy Scale: Poor Standing balance comment: relies on RW                            ADL Overall ADL's : Needs assistance/impaired                                     Functional mobility during ADLs: Minimal assistance;Rolling walker General ADL Comments: Upon entering the room, pt was very lethargic with RN stating pt recently took pain medication. Pt agreeable to OT intervtention this session. Pt performing supine >sit with mod A to EOB. Pt requring total A to don brace. Pt able to verbalize 2/3 back precautions with OT providing further education  in regards to  self care with precautions. Pt standing from standard bed height with min A and use of RW. Pt taking side steps to head of bed before sitting. Pt with increased pain and returning to bed at end of session with log roll technique.                 Pertinent Vitals/Pain Pain Assessment: 0-10 Pain Score: 10-Worst pain ever Pain Location: back Pain Descriptors / Indicators: Aching;Sore;Guarding Pain Intervention(s): Limited activity within patient's tolerance;Monitored during session;Premedicated before session;Repositioned  Hand Dominance Right   Extremity/Trunk Assessment Upper Extremity Assessment Upper Extremity Assessment: Generalized weakness   Lower Extremity Assessment Lower Extremity Assessment: Defer to PT evaluation       Communication Communication Communication: No difficulties   Cognition Arousal/Alertness: Lethargic Behavior During Therapy:  Anxious Overall Cognitive Status: Within Functional Limits for tasks assessed                                Home Living Family/patient expects to be discharged to:: Private residence (following information is in regards to daughters home) Living Arrangements:  (lives alone but plans to stay with daughter or go SNF at d/c) Available Help at Discharge: Family Type of Home: House Home Access: Stairs to enter Technical brewer of Steps: 5 Entrance Stairs-Rails: Can reach both Home Layout: One level     Bathroom Shower/Tub: Tub/shower unit;Walk-in shower Shower/tub characteristics: Architectural technologist: Standard Bathroom Accessibility: Yes How Accessible: Accessible via walker Home Equipment: [place] - 2 wheels;Shower seat;Bedside commode   Additional Comments: home info not taken due to pt initially agreeable to SNF, then states she would not go into rehab, but prefers to go stay with her daughter if needed.      Prior Functioning/Environment Level of Independence: Independent             OT Diagnosis: Acute pain;Generalized weakness   OT Problem List: Decreased strength;Decreased activity tolerance;Impaired balance (sitting and/or standing);Decreased knowledge of use of DME or AE;Pain;Decreased safety awareness   OT Treatment/Interventions: Self-care/ADL training;Balance training;Therapeutic exercise;Therapeutic activities;Energy conservation;DME and/or AE instruction;Patient/family education    OT Goals(Current goals can be found in the care plan section) Acute Rehab OT Goals Patient Stated Goal: to go home OT Goal Formulation: With patient Time For Goal Achievement: 08/16/15 Potential to Achieve Goals: Fair ADL Goals Pt Will Perform Lower Body Bathing: with modified independence;with adaptive equipment;sit to/from stand Pt Will Perform Lower Body Dressing: with modified independence;with adaptive equipment;sit to/from stand Pt Will Transfer to Toilet:  with modified independence;bedside commode;ambulating;stand pivot transfer Pt Will Perform Toileting - Clothing Manipulation and hygiene: with modified independence;with adaptive equipment;sitting/lateral leans;sit to/from stand Pt Will Perform Tub/Shower Transfer: with supervision;rolling walker;shower seat;ambulating;Stand pivot transfer  OT Frequency: Min 2X/week   Barriers to D/C:    Pt lives alone and would like to discharge home with daughter or SNF          End of Session Equipment Utilized During Treatment: Rolling walker;Back brace  Activity Tolerance: Patient limited by pain Patient left: in bed;with call bell/phone within reach;with family/visitor present;with bed alarm set   Time: 1310-1334 OT Time Calculation (min): 24 min Charges:  OT General Charges $OT Visit: 1 Procedure OT Evaluation $Initial OT Evaluation Tier I: 1 Procedure OT Treatments $Therapeutic Activity: 8-22 mins  Pittman, Huntley Demedeiros L, MS, OTR/L 08/02/2015, 2:51 PM

## 2015-08-02 NOTE — Evaluation (Signed)
Physical Therapy Evaluation Patient Details Name: Jill Phillips MRN: [id number] DOB: 01-23-1956 Today's Date: 08/02/2015   History of Present Illness  Patient is a 59 y/o female admitted due to back pain with h/o previous back fusions.  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  She will benefit from skilled PT in the acute setting to allow return home with family support and HHPT.    Follow Up Recommendations Home health PT (if she does to daughter's house will need in [place].)    Equipment Recommendations  None recommended by PT (need to ensure she still has her walker)    Recommendations for Other Services       Precautions / Restrictions Precautions Precautions: Fall;Back Required Braces or Orthoses: Spinal Brace Spinal Brace: Thoracolumbosacral orthotic;Applied in sitting position      Mobility  Bed Mobility Overal bed mobility: Needs Assistance Bed Mobility: Rolling;Sidelying to Sit Rolling: Supervision Sidelying to sit: Min assist;Mod assist;HOB elevated       General bed mobility comments: cues and heavy reliance on rail, min A, but slow and step by step instructions for pushing up on bed to come to sit, increased time and assist to scoot to edge of bed.  Transfers Overall transfer level: Needs assistance Equipment used: Rolling walker (2 wheeled) Transfers: Sit to/from [facility] Sit to Stand: Min assist;Mod assist Stand pivot transfers: Min assist       General transfer comment: cues for hand placement, lifting and lowering assist about 15-20%  Ambulation/Gait Ambulation/Gait assistance: Min guard Ambulation Distance (Feet): 6 Feet Assistive device: Rolling walker  (2 wheeled) Gait Pattern/deviations: Step-to pattern;Decreased stride length;Antalgic;Trunk flexed     General Gait Details: limited to bed > BSC and BSC> chair due to pain  Stairs            Wheelchair Mobility    Modified Rankin (Stroke Patients Only)       Balance Overall balance assessment: Needs assistance Sitting-balance support: Feet supported;Feet unsupported Sitting balance-Leahy Scale: Fair     Standing balance support: Bilateral upper extremity supported Standing balance-Leahy Scale: Poor Standing balance comment: heavily relies on UE support for balance                             Pertinent Vitals/Pain Pain Assessment: 0-10 Pain Score: 9  Pain Location: back Pain Descriptors / Indicators: Aching Pain Intervention(s): Limited activity within patient's tolerance;Monitored during session;RN gave pain meds during session    [place] expects to be discharged to:: Private residence Living Arrangements: Alone Available Help at Discharge: Family (could go and stay with daughter in [place])           Home Equipment: [place] - 2 wheels Additional Comments: home info not taken due to pt initially agreeable to SNF, then states she would not go into rehab, but prefers to go stay with her daughter if needed.    Prior Function Level of Independence: Independent               Hand Dominance   Dominant Hand: Right    Extremity/Trunk Assessment   Upper Extremity Assessment: Defer to OT evaluation           Lower Extremity  Assessment: Generalized weakness         Communication   Communication: No difficulties  Cognition Arousal/Alertness: Awake/alert Behavior During Therapy: Anxious Overall Cognitive Status: Within Functional Limits for tasks assessed                      General Comments General comments (skin integrity, edema, etc.): donned brace on pt total A.  Reported she has had this kind of  brace in the past    Exercises        Assessment/Plan    PT Assessment Patient needs continued PT services  PT Diagnosis Difficulty walking;Acute pain   PT Problem List Decreased strength;Pain;Decreased mobility;Decreased knowledge of precautions;Decreased activity tolerance;Decreased knowledge of use of DME  PT Treatment Interventions DME instruction;Balance training;Gait training;Functional mobility training;Patient/family education;Stair training;Therapeutic activities;Therapeutic exercise   PT Goals (Current goals can be found in the Care Plan section) Acute Rehab PT Goals Patient Stated Goal: To return to independent PT Goal Formulation: With patient Time For Goal Achievement: 08/09/15 Potential to Achieve Goals: Good    Frequency Min 5X/week   Barriers to discharge        Co-evaluation               End of Session Equipment Utilized During Treatment: Back brace Activity Tolerance: Patient limited by pain Patient left: in chair;with call bell/phone within reach;with chair alarm set Nurse Communication: Mobility status         Time: [id number] PT Time Calculation (min) (ACUTE ONLY): 24 min   Charges:   PT Evaluation $Initial PT Evaluation Tier I: 1 Procedure PT Treatments $Therapeutic Activity: 8-22 mins   PT G Codes:        Briggitte Boline,CYNDI 18-Aug-2015, 12:20 PM  Magda Kiel, Noel 2015-08-18

## 2015-08-02 NOTE — Care Management Note (Signed)
Case Management Note  Patient Details  Name: Jill Phillips MRN: [id number] Date of Birth: 1955-09-28  Subjective/Objective:                    Action/Plan: Pt states she is planning on going to her daughters house in [place], [place] at discharge. CM will research and provide [facility] agencies in that area.   Expected Discharge Date:                  Expected Discharge Plan:     In-House Referral:     Discharge planning Services     Post Acute Care Choice:    Choice offered to:     DME Arranged:    DME Agency:     HH Arranged:    HH Agency:     Status of Service:  In process, will continue to follow  Medicare Important Message Given:    Date Medicare IM Given:    Medicare IM give by:    Date Additional Medicare IM Given:    Additional Medicare Important Message give by:     If discussed at [place]  of Stay Meetings, dates discussed:    Additional Comments:  Pollie Friar, RN 08/02/2015, 4:33 PM

## 2015-08-02 NOTE — Care Management Note (Addendum)
Case Management Note  Patient Details  Name: Jill Phillips MRN: [id number] Date of Birth: [birth year]  Subjective/Objective:   Patient admitted with spondylolisthesis of lumbar region and underwent a L1-3 TLIF. Patient is from home alone.                  Action/Plan: Awaiting PT/OT recommendations. Patient with orders for [facility] and DME. CM will continue to follow for discharge needs.    Expected Discharge Date:                  Expected Discharge Plan:     In-House Referral:     Discharge planning Services     Post Acute Care Choice:    Choice offered to:     DME Arranged:    DME Agency:     HH Arranged:    HH Agency:     Status of Service:  In process, will continue to follow  Medicare Important Message Given:    Date Medicare IM Given:    Medicare IM give by:    Date Additional Medicare IM Given:    Additional Medicare Important Message give by:     If discussed at [place] of Stay Meetings, dates discussed:    Additional Comments:  Pollie Friar, RN 08/02/2015, 11:08 AM

## 2015-08-02 NOTE — Progress Notes (Addendum)
Subjective: C/o back pain. Very drowsy but responds appropriately to questioning.  preop leg pain better.    Objective: Vital signs in last 24 hours: Temp:  [98 F (36.7 C)-102.5 F (39.2 C)] 102.5 F (39.2 C) (11/30 0639) Pulse Rate:  [74-95] 95 (11/30 0532) Resp:  [14-23] 20 (11/30 0532) BP: (88-145)/(58-83) 145/83 mmHg (11/30 0532) SpO2:  [90 %-100 %] 95 % (11/30 0532)  Intake/Output from previous day: 11/29 0701 - 11/30 0700 In: 3150 [I.V.:2500; Blood:150; IV Piggyback:500] Out: 1930 [Urine:1530; Blood:400] Intake/Output this shift:     Recent Labs  08/01/15 1700  HGB 9.2*    Recent Labs  08/01/15 1700  WBC 9.5  RBC 2.94*  HCT 28.3*  PLT 180    Recent Labs  08/02/15 0229  NA 139  K 3.6  CL 105  CO2 27  BUN 11  CREATININE 1.12*  GLUCOSE 112*  CALCIUM 7.9*   No results for input(s): LABPT, INR in the last 72 hours.  Exam:  Very drowsy but answers questions appropriately. No respiratory distress.  bilat calves nontender.  Trace right ehl weakness. Pedal pulses intact.    Assessment/Plan: Encouraged incentive spirometry.  Brace in room and must be on when she is up and ambulating. PT protocol.     OWENS,Jaleia Hanke M 08/02/2015, 7:54 AM     Patient examined and note reviewed. Recommend for albuterol inhalation treatments q6 for 4 treatments. CXR with low lung volumes. UA neg Elevated temp likely pulm source/atelectasis with low lung volumes.

## 2015-08-03 DIAGNOSIS — D62 Acute posthemorrhagic anemia: Secondary | ICD-10-CM | POA: Diagnosis not present

## 2015-08-03 LAB — BASIC METABOLIC PANEL
Anion gap: 9 (ref 5–15)
BUN: 9 mg/dL (ref 6–20)
CO2: 25 mmol/L (ref 22–32)
Calcium: 7.9 mg/dL — ABNORMAL LOW (ref 8.9–10.3)
Chloride: 102 mmol/L (ref 101–111)
Creatinine, Ser: 1.03 mg/dL — ABNORMAL HIGH (ref 0.44–1.00)
GFR calc Af Amer: >60 mL/min (ref >60)
GFR calc non Af Amer: 58 mL/min — ABNORMAL LOW (ref >60)
Glucose, Bld: 115 mg/dL — ABNORMAL HIGH (ref 65–99)
Potassium: 3 mmol/L — ABNORMAL LOW (ref 3.5–5.1)
Sodium: 136 mmol/L (ref 135–145)

## 2015-08-03 LAB — CBC WITH DIFFERENTIAL/PLATELET
Basophils Absolute: 0 10*3/uL (ref 0.0–0.1)
Basophils Relative: 0 %
Eosinophils Absolute: 0 10*3/uL (ref 0.0–0.7)
Eosinophils Relative: 0 %
HCT: 25.1 % — ABNORMAL LOW (ref 36.0–46.0)
Hemoglobin: 8.4 g/dL — ABNORMAL LOW (ref 12.0–15.0)
Lymphocytes Relative: 13 %
Lymphs Abs: 1.2 10*3/uL (ref 0.7–4.0)
MCH: 31.3 pg (ref 26.0–34.0)
MCHC: 33.5 g/dL (ref 30.0–36.0)
MCV: 93.7 fL (ref 78.0–100.0)
Monocytes Absolute: 0.6 10*3/uL (ref 0.1–1.0)
Monocytes Relative: 7 %
Neutro Abs: 7.5 10*3/uL (ref 1.7–7.7)
Neutrophils Relative %: 80 %
Platelets: 154 10*3/uL (ref 150–400)
RBC: 2.68 MIL/uL — ABNORMAL LOW (ref 3.87–5.11)
RDW: 13.6 % (ref 11.5–15.5)
WBC: 9.4 10*3/uL (ref 4.0–10.5)

## 2015-08-03 MED ORDER — SENNOSIDES-DOCUSATE SODIUM 8.6-50 MG PO TABS
2.0000 | ORAL_TABLET | Freq: Every day | ORAL | Status: DC
  Administered 2015-08-03 – 2015-08-04 (×2): 2 via ORAL
  Filled 2015-08-03 (×3): qty 2

## 2015-08-03 MED ORDER — POTASSIUM CHLORIDE CRYS ER 20 MEQ PO TBCR
30.0000 meq | EXTENDED_RELEASE_TABLET | Freq: Two times a day (BID) | ORAL | Status: AC
  Administered 2015-08-03 – 2015-08-04 (×4): 30 meq via ORAL
  Filled 2015-08-03 (×4): qty 1

## 2015-08-03 MED ORDER — FENTANYL 75 MCG/HR TD PT72
75.0000 ug | MEDICATED_PATCH | TRANSDERMAL | Status: DC
  Administered 2015-08-05: 75 ug via TRANSDERMAL
  Filled 2015-08-03: qty 1

## 2015-08-03 MED ORDER — FERROUS GLUCONATE 324 (38 FE) MG PO TABS
324.0000 mg | ORAL_TABLET | Freq: Two times a day (BID) | ORAL | Status: DC
  Administered 2015-08-03 – 2015-08-05 (×5): 324 mg via ORAL
  Filled 2015-08-03 (×7): qty 1

## 2015-08-03 NOTE — Progress Notes (Signed)
Physical Therapy Treatment Patient Details Name: Jill Phillips MRN: [id number] DOB: May 24, 1956 Today's Date: 08/03/2015    History of Present Illness Patient is a 59 y/o female admitted due to back pain with h/o previous back fusions.  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  Feel safe to d/c to daughter's home if has assist initially for mobility.    Follow Up Recommendations  Home health PT     Equipment Recommendations  None recommended by PT (if she still has her walker)    Recommendations for Other Services       Precautions / Restrictions Precautions Precautions: Fall;Back Required Braces or Orthoses: Spinal Brace Spinal Brace: Thoracolumbosacral orthotic;Applied in sitting position    Mobility  Bed Mobility Overal bed mobility: Needs Assistance Bed Mobility: Sit to Sidelying Rolling: Supervision Sidelying to sit: Supervision     Sit to sidelying: Min guard General bed mobility comments: HOB flat with mobility, attempted without rail, but trying to sit straight up, cues for rolling and from sidelying and allowed rail use, assist for legs in bed to sidelying  Transfers Overall transfer level: Needs assistance Equipment used: Rolling walker (2 wheeled) Transfers: Sit to/from Stand Sit to Stand: Min guard         General transfer comment: cues due to pulling up on walker  Ambulation/Gait Ambulation/Gait assistance: Supervision;Min guard Ambulation Distance (Feet): 120 Feet Assistive device: Rolling walker (2 wheeled) Gait Pattern/deviations: Step-through pattern;Decreased stride length;Shuffle     General Gait Details: R lateral lean, slow and cues for safety with turns and backing up with walker to  bed   Stairs Stairs: Yes Stairs assistance: Min assist Stair Management: One rail Right;No rails;Step to pattern;Sideways (hand hold A) Number of Stairs: 4 General stair comments: assist and cues for safety, has front steps at daughter's home without steps, states can use cane and grandson can A (back steps with rail)  Wheelchair Mobility    Modified Rankin (Stroke Patients Only)       Balance Overall balance assessment: Needs assistance         Standing balance support: Bilateral upper extremity supported Standing balance-Leahy Scale: Poor Standing balance comment: poor postural strength, RW for balance                    Cognition Arousal/Alertness: Awake/alert Behavior During Therapy: WFL for tasks assessed/performed Overall Cognitive Status: Within Functional Limits for tasks assessed                      Exercises      General Comments General comments (skin integrity, edema, etc.): educated verbally about brace technique and pt assisted some with doffing brace      Pertinent Vitals/Pain Pain Score: 7  Pain Location: back Pain Descriptors / Indicators: Aching;Sore;Other (Comment) (nausea) Pain Intervention(s): Monitored during session;Patient requesting pain meds-RN notified    Home Living                      Prior Function            PT Goals (current goals can now be found in the care plan section) Progress towards PT goals: Progressing toward goals    Frequency  Min 5X/week  PT Plan Current plan remains appropriate    Co-evaluation             End of Session Equipment Utilized During Treatment: Back brace Activity Tolerance: Patient limited by fatigue (nausea) Patient left: in bed;with call bell/phone within reach     Time: 1340-1409 PT Time Calculation (min) (ACUTE ONLY): 29 min  Charges:  $Gait Training: 23-37 mins                    G Codes:      Jill Phillips,Jill Phillips 08/12/2015, 2:55 PM  Magda Kiel,  [place] 08-12-15

## 2015-08-03 NOTE — Progress Notes (Signed)
     Subjective: 2 Days Post-Op Procedure(s) (LRB): Right sided L1-2 and L2-3 transforaminal lumbar interbody fusion with cages, Extension of posterior fusion T12 to L3, Replaced pedicle screws bilaterally L1-L2 , Replaced left sided pedicle screws L-3. Instrumentation T12 to L3 using local bone graft, Vivigen allograft and cancellous chips (N/A)Awake, alert and oriented x 4. Passing gas infrequently.  Patient reports pain as moderate.    Objective:   VITALS:  Temp:  [99.1 F (37.3 C)-102.7 F (39.3 C)] 99.5 F (37.5 C) (12/01 0504) Pulse Rate:  [87-101] 95 (12/01 0504) Resp:  [16-20] 20 (12/01 0504) BP: (93-132)/(52-73) 110/59 mmHg (12/01 0504) SpO2:  [93 %-100 %] 98 % (12/01 0504)  Neurologically intact ABD soft Neurovascular intact Sensation intact distally Intact pulses distally Dorsiflexion/Plantar flexion intact Incision: scant drainage   LABS  Recent Labs  08/01/15 1700 08/03/15 0528  HGB 9.2* 8.4*  WBC 9.5 9.4  PLT 180 154    Recent Labs  08/02/15 0229 08/03/15 0528  NA 139 136  K 3.6 3.0*  CL 105 102  CO2 27 25  BUN 11 9  CREATININE 1.12* 1.03*  GLUCOSE 112* 115*   No results for input(s): LABPT, INR in the last 72 hours.   Assessment/Plan: 2 Days Post-Op Procedure(s) (LRB): Right sided L1-2 and L2-3 transforaminal lumbar interbody fusion with cages, Extension of posterior fusion T12 to L3, Replaced pedicle screws bilaterally L1-L2 , Replaced left sided pedicle screws L-3. Instrumentation T12 to L3 using local bone graft, Vivigen allograft and cancellous chips (N/A)  Anemia post op acute blood loss periop.  Advance diet Up with therapy D/C IV fluids  PT/OT Increase fentanyl patch to 75 mcg/hr  Jill Phillips E 08/03/2015, 8:17 AM

## 2015-08-03 NOTE — Care Management Note (Signed)
Case Management Note  Patient Details  Name: EVYN KOOYMAN MRN: [id number] Date of Birth: 1956-06-05  Subjective/Objective:                    Action/Plan: Plan is for patient to discharge to her daughters home in [place], [place] with home health PT services. CM met with the patient and provided her a list of home health agencies in the [facility] area. Patient selected [place]. CM spoke with Pauline Aus and faxed them the information requested [phone]. Patient also has rolling walker and 3 in 1 ordered. Patient states she has a walker at home but needs the 3 in [place] with [facility] DME notified and equipment to be delivered to the patients room. Bedside RN updated.   Expected Discharge Date:                  Expected Discharge Plan:  [place]  In-House Referral:     Discharge planning Services  CM Consult  Post Acute Care Choice:  Home Health, Durable Medical Equipment Choice offered to:  Patient  DME Arranged:  3-N-1, Walker rolling DME Agency:  [facility]  HH Arranged:  PT [facility] Agency:     Status of Service:  Completed, signed off  Medicare Important Message Given:    Date Medicare IM Given:    Medicare IM give by:    Date Additional Medicare IM Given:    Additional Medicare Important Message give by:     If discussed at [place] of Stay Meetings, dates discussed:    Additional Comments:  Pollie Friar, RN 08/03/2015, 10:41 AM

## 2015-08-04 LAB — CBC
HCT: 24.8 % — ABNORMAL LOW (ref 36.0–46.0)
Hemoglobin: 8.1 g/dL — ABNORMAL LOW (ref 12.0–15.0)
MCH: 31 pg (ref 26.0–34.0)
MCHC: 32.7 g/dL (ref 30.0–36.0)
MCV: 95 fL (ref 78.0–100.0)
Platelets: 166 10*3/uL (ref 150–400)
RBC: 2.61 MIL/uL — ABNORMAL LOW (ref 3.87–5.11)
RDW: 13.7 % (ref 11.5–15.5)
WBC: 8.3 10*3/uL (ref 4.0–10.5)

## 2015-08-04 LAB — BASIC METABOLIC PANEL
Anion gap: 13 (ref 5–15)
BUN: 10 mg/dL (ref 6–20)
CO2: 24 mmol/L (ref 22–32)
Calcium: 8.2 mg/dL — ABNORMAL LOW (ref 8.9–10.3)
Chloride: 100 mmol/L — ABNORMAL LOW (ref 101–111)
Creatinine, Ser: 1.13 mg/dL — ABNORMAL HIGH (ref 0.44–1.00)
GFR calc Af Amer: 60 mL/min — ABNORMAL LOW (ref >60)
GFR calc non Af Amer: 52 mL/min — ABNORMAL LOW (ref >60)
Glucose, Bld: 96 mg/dL (ref 65–99)
Potassium: 3.2 mmol/L — ABNORMAL LOW (ref 3.5–5.1)
Sodium: 137 mmol/L (ref 135–145)

## 2015-08-04 LAB — PREPARE RBC (CROSSMATCH)

## 2015-08-04 MED ORDER — HYDROCODONE-ACETAMINOPHEN 10-325 MG PO TABS: 1.0000 | ORAL_TABLET | ORAL | Status: DC | PRN

## 2015-08-04 MED ORDER — CARISOPRODOL 350 MG PO TABS
ORAL_TABLET | ORAL | Status: DC
Start: 2015-08-04 — End: 2015-09-19

## 2015-08-04 MED ORDER — FENTANYL 75 MCG/HR TD PT72: 75.0000 ug | MEDICATED_PATCH | TRANSDERMAL | Status: DC

## 2015-08-04 MED ORDER — SODIUM CHLORIDE 0.9 % IV SOLN
Freq: Once | INTRAVENOUS | Status: AC
  Administered 2015-08-04: 19:00:00 via INTRAVENOUS

## 2015-08-04 MED ORDER — IPRATROPIUM-ALBUTEROL 0.5-2.5 (3) MG/3ML IN SOLN: 3.0000 mL | RESPIRATORY_TRACT | Status: DC | PRN

## 2015-08-04 MED ORDER — SIMETHICONE 80 MG PO CHEW
80.0000 mg | CHEWABLE_TABLET | Freq: Four times a day (QID) | ORAL | Status: DC | PRN
  Administered 2015-08-04: 80 mg via ORAL
  Filled 2015-08-04: qty 1

## 2015-08-04 MED ORDER — FERROUS GLUCONATE 324 (38 FE) MG PO TABS: 324.0000 mg | ORAL_TABLET | Freq: Three times a day (TID) | ORAL | Status: DC

## 2015-08-04 MED ORDER — CARISOPRODOL 350 MG PO TABS: 350.0000 mg | ORAL_TABLET | Freq: Three times a day (TID) | ORAL | Status: DC

## 2015-08-04 NOTE — Progress Notes (Signed)
Patient's BP low at 74/45. Patient asymptomatic at this time. MD on call notified.

## 2015-08-04 NOTE — Progress Notes (Signed)
MD's office called back and new orders received.

## 2015-08-04 NOTE — Progress Notes (Signed)
Spoke to MD regarding no change in BP. States to observe since asymptomatic and to not give pain medicine. Ruben Gottron, RN 08/04/2015 11:35 PM

## 2015-08-04 NOTE — Progress Notes (Signed)
     Subjective: 3 Days Post-Op Procedure(s) (LRB): Right sided L1-2 and L2-3 transforaminal lumbar interbody fusion with cages, Extension of posterior fusion T12 to L3, Replaced pedicle screws bilaterally L1-L2 , Replaced left sided pedicle screws L-3. Instrumentation T12 to L3 using local bone graft, Vivigen allograft and cancellous chips (N/A)Awake, alert and oriented x 4. I hurt.  Patient reports pain as moderate.    Objective:   VITALS:  Temp:  [98.4 F (36.9 C)-100.3 F (37.9 C)] 98.6 F (37 C) (12/02 0508) Pulse Rate:  [85-96] 85 (12/02 0508) Resp:  [16-20] 20 (12/02 0508) BP: (95-111)/(51-63) 103/58 mmHg (12/02 0508) SpO2:  [98 %-100 %] 98 % (12/02 0508)  Neurologically intact ABD soft Neurovascular intact Sensation intact distally Intact pulses distally Dorsiflexion/Plantar flexion intact Incision: scant drainage   LABS  Recent Labs  08/01/15 1700 08/03/15 0528  HGB 9.2* 8.4*  WBC 9.5 9.4  PLT 180 154    Recent Labs  08/02/15 0229 08/03/15 0528  NA 139 136  K 3.6 3.0*  CL 105 102  CO2 27 25  BUN 11 9  CREATININE 1.12* 1.03*  GLUCOSE 112* 115*   No results for input(s): LABPT, INR in the last 72 hours.   Assessment/Plan: 3 Days Post-Op Procedure(s) (LRB): Right sided L1-2 and L2-3 transforaminal lumbar interbody fusion with cages, Extension of posterior fusion T12 to L3, Replaced pedicle screws bilaterally L1-L2 , Replaced left sided pedicle screws L-3. Instrumentation T12 to L3 using local bone graft, Vivigen allograft and cancellous chips (N/A)  Advance diet D/C IV fluids Plan for discharge tomorrow Discharge home with home health  Jill Phillips E 08/04/2015, 8:21 AM

## 2015-08-04 NOTE — Care Management Important Message (Signed)
Important Message  Patient Details  Name: Jill Phillips MRN: [id number] Date of Birth: 11-11-1955   Medicare Important Message Given:  Yes    Leetta Hendriks P Tayvion Lauder 08/04/2015, 2:11 PM

## 2015-08-04 NOTE — Progress Notes (Signed)
Physical Therapy Treatment Patient Details Name: Jill Phillips MRN: [id number] DOB: 29-Jan-1956 Today's Date: 08/04/2015    History of Present Illness Patient is a 59 y/o female admitted due to back pain with h/o previous back fusions.  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 Did need supervision for safety. Patient reports her plan now is for daughter to come and stay with her "for a few days, and then go to her house."   Follow Up Recommendations  Home health PT;Supervision for mobility/OOB (continued education for brace)     Equipment Recommendations  None recommended by PT    Recommendations for Other Services       Precautions / Restrictions Precautions Precautions: Fall;Back Required Braces or Orthoses: Spinal Brace Spinal Brace: Thoracolumbosacral orthotic;Applied in sitting position    Mobility  Bed Mobility Overal bed mobility: Modified Independent Bed Mobility: Sidelying to Sit;Sit to Sidelying   Sidelying to sit: Modified independent (Device/Increase time)     Sit to sidelying: Modified independent (Device/Increase time) General bed mobility comments: HOB flat and no rail; incr time; no cues needed  Transfers Overall transfer level: Needs assistance Equipment used: Rolling walker (2 wheeled) Transfers: Sit to/from Stand Sit to Stand: Min guard         General transfer comment: vc for safe use of RW; minguard for safety due to lethargy  Ambulation/Gait Ambulation/Gait assistance: Min guard Ambulation Distance (Feet): 140 Feet Assistive device: Rolling walker (2 wheeled) Gait Pattern/deviations: Step-through pattern;Decreased stride length   Gait velocity interpretation: Below normal  speed for age/gender General Gait Details: R lateral lean, slow and cues for safety with turns    Stairs            Wheelchair Mobility    Modified Rankin (Stroke Patients Only)       Balance                                    Cognition Arousal/Alertness: Lethargic;Suspect due to medications Behavior During Therapy: [facility] for tasks assessed/performed Overall Cognitive Status: Within Functional Limits for tasks assessed                      Exercises      General Comments        Pertinent Vitals/Pain Pain Assessment: Faces Faces Pain Scale: Hurts even more Pain Location: back Pain Descriptors / Indicators: Operative site guarding Pain Intervention(s): Limited activity within patient's tolerance;Monitored during session;Premedicated before session;Repositioned    Home Living                      Prior Function            PT Goals (current goals can now be found in the care plan section) Acute Rehab PT Goals Patient Stated Goal: to go home Progress towards PT goals: Goals met/education completed, patient discharged from PT    Frequency  Min 5X/week    PT Plan Current plan remains appropriate    Co-evaluation             End of Session Equipment Utilized During Treatment: Gait belt;Back brace Activity Tolerance: Patient tolerated treatment  well Patient left: in bed;with call bell/phone within reach;with bed alarm set   Patient is being discharged from PT services secondary to:  Goals met and no further therapy needs identified.    Please see latest Therapy Progress Note for current level of functioning and progress toward goals.   Progress and discharge plan and discussed with patient/caregiver and they  Agree      Time: 4098-1191 PT Time Calculation (min) (ACUTE ONLY): 33 min  Charges:  $Gait Training: 23-37 mins                    G Codes:      Kindred Reidinger August 05, 2015, 2:42 PM  Pager  [phone]

## 2015-08-04 NOTE — Progress Notes (Signed)
Patient complaining of gas pain, MD's office called to notify.

## 2015-08-05 DIAGNOSIS — H60399 Other infective otitis externa, unspecified ear: Secondary | ICD-10-CM | POA: Diagnosis not present

## 2015-08-05 LAB — TYPE AND SCREEN
ABO/RH(D): A POS
Antibody Screen: NEGATIVE
Unit division: 0

## 2015-08-05 LAB — CBC
HCT: 24.8 % — ABNORMAL LOW (ref 36.0–46.0)
Hemoglobin: 8 g/dL — ABNORMAL LOW (ref 12.0–15.0)
MCH: 30 pg (ref 26.0–34.0)
MCHC: 32.3 g/dL (ref 30.0–36.0)
MCV: 92.9 fL (ref 78.0–100.0)
Platelets: 170 10*3/uL (ref 150–400)
RBC: 2.67 MIL/uL — ABNORMAL LOW (ref 3.87–5.11)
RDW: 15.4 % (ref 11.5–15.5)
WBC: 6.5 10*3/uL (ref 4.0–10.5)

## 2015-08-05 MED ORDER — CIPROFLOXACIN-HYDROCORTISONE 0.2-1 % OT SUSP: 3.0000 [drp] | Freq: Two times a day (BID) | OTIC | Status: DC

## 2015-08-05 MED ORDER — CIPROFLOXACIN-HYDROCORTISONE 0.2-1 % OT SUSP
3.0000 [drp] | Freq: Two times a day (BID) | OTIC | Status: DC
  Filled 2015-08-05: qty 10

## 2015-08-05 NOTE — Progress Notes (Signed)
Patient is complaining of LUE, left hip, and RUE pain. IV from left wrist taken out yesterday and new left forearm IV placed for blood transfusion. VS remained stable. States this pain is the same pain she's been having the past few days. Given muscle relaxers and hot packs as well. Will continue to monitor. Joaquin Bend E, RN 08/05/2015 2:25 AM

## 2015-08-05 NOTE — Progress Notes (Addendum)
     Subjective: 4 Days Post-Op Procedure(s) (LRB): Right sided L1-2 and L2-3 transforaminal lumbar interbody fusion with cages, Extension of posterior fusion T12 to L3, Replaced pedicle screws bilaterally L1-L2 , Replaced left sided pedicle screws L-3. Instrumentation T12 to L3 using local bone graft, Vivigen allograft and cancellous chips (N/A) Awake and alert and oriented x 4. I scratched the inside of left ear canal with my fingernail and now it aches. I hurt but I want to go home. Blood drawn last night, and IV left forearm. I am able to walk and stand and go to the bathroom. Positive flatus.  Patient reports pain as moderate.    Objective:   VITALS:  Temp:  [98.9 F (37.2 C)-99.8 F (37.7 C)] 99.1 F (37.3 C) (12/03 0901) Pulse Rate:  [75-101] 92 (12/03 0901) Resp:  [16-20] 18 (12/03 0901) BP: (75-97)/(45-74) 85/50 mmHg (12/03 0901) SpO2:  [94 %-100 %] 94 % (12/03 0901)  Neurologically intact ABD soft Neurovascular intact Sensation intact distally Intact pulses distally Dorsiflexion/Plantar flexion intact Incision: no drainage and Dressing changed. No cellulitis present Review of hospital progress notes from her 04/2015 [place] admission shows her Blood pressure does run occasionally low SBP 88-90 over DBP 50-52 even with normal Hemoglobin.  LABS  Recent Labs  08/03/15 0528 08/04/15 0750 08/05/15 0255  HGB 8.4* 8.1* 8.0*  WBC 9.4 8.3 6.5  PLT 154 166 170    Recent Labs  08/03/15 0528 08/04/15 0750  NA 136 137  K 3.0* 3.2*  CL 102 100*  CO2 25 24  BUN 9 10  CREATININE 1.03* 1.13*  GLUCOSE 115* 96   No results for input(s): LABPT, INR in the last 72 hours.   Assessment/Plan: 4 Days Post-Op Procedure(s) (LRB): Right sided L1-2 and L2-3 transforaminal lumbar interbody fusion with cages, Extension of posterior fusion T12 to L3, Replaced pedicle screws bilaterally L1-L2 , Replaced left sided pedicle screws L-3. Instrumentation T12 to L3 using local bone  graft, Vivigen allograft and cancellous chips (N/A)  Low blood pressures likely due to anemia, narcotics and decreased activity and sleep. Hospital Records from August 2016 admit to [facility] shows her blood pressure documented as low as 88/52 even with normal hgb. Clinically an external otitis of the left ear, will start left ear cipro/cortisone ear drops.  Advance diet Up with therapy D/C IV fluids Discharge home with home health  Continue on iron, hgb appears stable 8.1-8.0. Check orthostatic VS today prior to discharge. Left ear drops Cipro/Cortisone 3 drops BID.  Kreed Kauffman E 08/05/2015, 9:13 AM

## 2015-08-07 NOTE — Discharge Summary (Signed)
Physician Discharge Summary      Patient ID: ZUDORA SCHIFERL MRN: [id number] DOB/AGE: 1956/06/16 59 y.o.  Admit date: 08/01/2015 Discharge date: 08/05/2015  Admission Diagnoses:  Principal Problem:   Spondylolisthesis of lumbar region Active Problems:   Degenerative disc disease, lumbar   Postoperative anemia due to acute blood loss   Otitis, externa, infective   Discharge Diagnoses:  Same  Past Medical History  Diagnosis Date  . Postlaminectomy syndrome, thoracic region   . Osteoarthrosis, unspecified whether generalized or localized, lower leg   . Dysthymic disorder   . Calcifying tendinitis of shoulder   . Pain in joint, upper arm   . Chronic pain syndrome   . Lumbago   . Primary localized osteoarthrosis, lower leg   . Hypertension   . Hyperlipidemia   . GERD (gastroesophageal reflux disease)   . Thyroid disease   . Restless leg syndrome   . History of blood transfusion 1980  . Hypothyroidism   . Active smoker   . COPD (chronic obstructive pulmonary disease) ([place])     on nocturnal home o2  . Heart murmur     for years, nothing to be concerned about  . Sleep apnea     s/p surgery- last sleep study 2011- doesnt use oxygen or machine at night as instructed,.   12/2014- Dr Halford Chessman  reports it is negative.  [name] Anxiety   . Herpes genitalia   . PONV (postoperative nausea and vomiting)     gets nauseous  with longer surgery. Difficuty voiding after surgery    Surgeries: Procedure(s): Right sided L1-2 and L2-3 transforaminal lumbar interbody fusion with cages, Extension of posterior fusion T12 to L3, Replaced pedicle screws bilaterally L1-L2 , Replaced left sided pedicle screws L-3. Instrumentation T12 to L3 using local bone graft, Vivigen allograft and cancellous chips on 08/01/2015   Consultants:    Discharged Condition: Improved  Hospital Course: Jill Phillips is an 59 y.o. female who was admitted 08/01/2015 with a chief complaint of No chief complaint on file. ,  and found to have a diagnosis of Spondylolisthesis of lumbar region.  She was brought to the operating room on 08/01/2015 and underwent the above named procedures.    She was given perioperative antibiotics:  Anti-infectives    Start     Dose/Rate Route Frequency Ordered Stop   08/01/15 1900  vancomycin (VANCOCIN) IVPB 1000 mg/200 mL premix     1,000 mg 200 mL/hr over 60 Minutes Intravenous Every 12 hours 08/01/15 1733 08/01/15 2243   08/01/15 1745  valACYclovir (VALTREX) tablet 500 mg  Status:  Discontinued     500 mg Oral Daily 08/01/15 1733 08/05/15 1529   08/01/15 1137  vancomycin (VANCOCIN) powder  Status:  Discontinued       As needed 08/01/15 1138 08/01/15 1440   08/01/15 0700  vancomycin (VANCOCIN) IVPB 1000 mg/200 mL premix     1,000 mg 200 mL/hr over 60 Minutes Intravenous To [facility] 07/31/15 1357 08/01/15 0850    Post operatively she had an initial low grade fever. CXR showed low lung volumnes, with smoking History and history of COPD she was given breathing treatments and incentive spirometry and this resolved. POD#1 fitted with a TLSO and foley discontinued. Started with PT and complained Of pain, taking chronic narcotics preop. Her fentanyl patch strengthened to 75 mcg from 50 mcg/hr. Given hydrocodon and percocet for breakthrough pain. Voiding without difficulty. POD#2 Continued with PT and OT. Taught brace care and donning of brace.  Tolerating po medications and PO diet. Minimal bloating and complaints of distension. Started on [employer] for gas. Hgb declined post op to 8.4 started on ferrous gluconate. POD#3 awake alert and oriented x 4. HGB At 8.1. Complaining of discomfort but able to ambulate well with no focal weakness in either leg Daughter reportedly sick at home. Blood pressures low normal, consistent with previous August  Hospitalization values. Antihypertensive agents held and not restarted. Orthostatic VS obtained. POD#4 awake alert and oriented x 4. VSS.  Able to ambulate with assistance to bathroom and in Hallway. Complained of a scratch in the left external auditory canal when inserting a finger in her  Left ear for itching. Pain with left ear palpation and pulling on the left ear. Diagnosis external otitis Started on cipro/cortisone otic 3 drops left ear BID. Incision dry without drainage or erythrema.  She was stable, all questions answered. She was discharged home on POD#4.  She was given sequential compression devices and early ambulation for DVT prophylaxis.  She benefited maximally from their hospital stay and there were no complications.    Recent vital signs:  Filed Vitals:   08/05/15 0500 08/05/15 0901  BP: 92/61 85/50  Pulse: 83 92  Temp: 99.8 F (37.7 C) 99.1 F (37.3 C)  Resp: 18 18    Recent laboratory studies:  Results for orders placed or performed during the hospital encounter of 08/01/15  CBC  Result Value Ref Range   WBC 9.5 4.0 - 10.5 K/uL   RBC 2.94 (L) 3.87 - 5.11 MIL/uL   Hemoglobin 9.2 (L) 12.0 - 15.0 g/dL   HCT 28.3 (L) 36.0 - 46.0 %   MCV 96.3 78.0 - 100.0 fL   MCH 31.3 26.0 - 34.0 pg   MCHC 32.5 30.0 - 36.0 g/dL   RDW 13.7 11.5 - 15.5 %   Platelets 180 150 - 400 K/uL  Comprehensive metabolic panel  Result Value Ref Range   Sodium 139 135 - 145 mmol/L   Potassium 3.6 3.5 - 5.1 mmol/L   Chloride 105 101 - 111 mmol/L   CO2 27 22 - 32 mmol/L   Glucose, Bld 112 (H) 65 - 99 mg/dL   BUN 11 6 - 20 mg/dL   Creatinine, Ser 1.12 (H) 0.44 - 1.00 mg/dL   Calcium 7.9 (L) 8.9 - 10.3 mg/dL   Total Protein 5.5 (L) 6.5 - 8.1 g/dL   Albumin 3.5 3.5 - 5.0 g/dL   AST 25 15 - 41 U/L   ALT 14 14 - 54 U/L   Alkaline Phosphatase 80 38 - 126 U/L   Total Bilirubin 0.6 0.3 - 1.2 mg/dL   GFR calc non Af Amer 52 (L) >60 mL/min   GFR calc Af Amer >60 >60 mL/min   Anion gap 7 5 - 15  Urinalysis, Routine w reflex microscopic (not at [facility])  Result Value Ref Range   Color, Urine YELLOW YELLOW   APPearance CLEAR CLEAR     Specific Gravity, Urine 1.011 1.005 - 1.030   pH 7.5 5.0 - 8.0   Glucose, UA NEGATIVE NEGATIVE mg/dL   Hgb urine dipstick TRACE (A) NEGATIVE   Bilirubin Urine NEGATIVE NEGATIVE   Ketones, ur NEGATIVE NEGATIVE mg/dL   Protein, ur NEGATIVE NEGATIVE mg/dL   Nitrite NEGATIVE NEGATIVE   Leukocytes, UA NEGATIVE NEGATIVE  Urine microscopic-add on  Result Value Ref Range   Squamous Epithelial / LPF 0-5 (A) NONE SEEN   WBC, UA 0-5 0 - 5 WBC/hpf  RBC / HPF 0-5 0 - 5 RBC/hpf   Bacteria, UA RARE (A) NONE SEEN  CBC with Differential/Platelet  Result Value Ref Range   WBC 9.4 4.0 - 10.5 K/uL   RBC 2.68 (L) 3.87 - 5.11 MIL/uL   Hemoglobin 8.4 (L) 12.0 - 15.0 g/dL   HCT 25.1 (L) 36.0 - 46.0 %   MCV 93.7 78.0 - 100.0 fL   MCH 31.3 26.0 - 34.0 pg   MCHC 33.5 30.0 - 36.0 g/dL   RDW 13.6 11.5 - 15.5 %   Platelets 154 150 - 400 K/uL   Neutrophils Relative % 80 %   Neutro Abs 7.5 1.7 - 7.7 K/uL   Lymphocytes Relative 13 %   Lymphs Abs 1.2 0.7 - 4.0 K/uL   Monocytes Relative 7 %   Monocytes Absolute 0.6 0.1 - 1.0 K/uL   Eosinophils Relative 0 %   Eosinophils Absolute 0.0 0.0 - 0.7 K/uL   Basophils Relative 0 %   Basophils Absolute 0.0 0.0 - 0.1 K/uL  Basic metabolic panel  Result Value Ref Range   Sodium 136 135 - 145 mmol/L   Potassium 3.0 (L) 3.5 - 5.1 mmol/L   Chloride 102 101 - 111 mmol/L   CO2 25 22 - 32 mmol/L   Glucose, Bld 115 (H) 65 - 99 mg/dL   BUN 9 6 - 20 mg/dL   Creatinine, Ser 1.03 (H) 0.44 - 1.00 mg/dL   Calcium 7.9 (L) 8.9 - 10.3 mg/dL   GFR calc non Af Amer 58 (L) >60 mL/min   GFR calc Af Amer >60 >60 mL/min   Anion gap 9 5 - 15  CBC  Result Value Ref Range   WBC 8.3 4.0 - 10.5 K/uL   RBC 2.61 (L) 3.87 - 5.11 MIL/uL   Hemoglobin 8.1 (L) 12.0 - 15.0 g/dL   HCT 24.8 (L) 36.0 - 46.0 %   MCV 95.0 78.0 - 100.0 fL   MCH 31.0 26.0 - 34.0 pg   MCHC 32.7 30.0 - 36.0 g/dL   RDW 13.7 11.5 - 15.5 %   Platelets 166 150 - 400 K/uL  Basic metabolic panel  Result Value Ref  Range   Sodium 137 135 - 145 mmol/L   Potassium 3.2 (L) 3.5 - 5.1 mmol/L   Chloride 100 (L) 101 - 111 mmol/L   CO2 24 22 - 32 mmol/L   Glucose, Bld 96 65 - 99 mg/dL   BUN 10 6 - 20 mg/dL   Creatinine, Ser 1.13 (H) 0.44 - 1.00 mg/dL   Calcium 8.2 (L) 8.9 - 10.3 mg/dL   GFR calc non Af Amer 52 (L) >60 mL/min   GFR calc Af Amer 60 (L) >60 mL/min   Anion gap 13 5 - 15  CBC  Result Value Ref Range   WBC 6.5 4.0 - 10.5 K/uL   RBC 2.67 (L) 3.87 - 5.11 MIL/uL   Hemoglobin 8.0 (L) 12.0 - 15.0 g/dL   HCT 24.8 (L) 36.0 - 46.0 %   MCV 92.9 78.0 - 100.0 fL   MCH 30.0 26.0 - 34.0 pg   MCHC 32.3 30.0 - 36.0 g/dL   RDW 15.4 11.5 - 15.5 %   Platelets 170 150 - 400 K/uL  Prepare RBC  Result Value Ref Range   Order Confirmation ORDER PROCESSED BY BLOOD BANK     Discharge Medications:     Medication List    STOP taking these medications        naproxen  500 MG tablet  Commonly known as:  NAPROSYN      TAKE these medications        acetaminophen 500 MG tablet  Commonly known as:  TYLENOL  Take 1,000 mg by mouth every 6 (six) hours as needed for headache.     albuterol 108 (90 BASE) MCG/ACT inhaler  Commonly known as:  PROVENTIL HFA;VENTOLIN HFA  Inhale 1 puff into the lungs every 6 (six) hours as needed for wheezing or shortness of breath.     ALPRAZolam 1 MG tablet  Commonly known as:  XANAX  Take 1 tablet (1 mg total) by mouth 2 (two) times daily as needed for anxiety.     amLODipine 10 MG tablet  Commonly known as:  NORVASC  TAKE 1 TABLET (10 MG TOTAL) BY MOUTH DAILY.     benazepril 40 MG tablet  Commonly known as:  LOTENSIN  TAKE 1 TABLET BY MOUTH DAILY.     carisoprodol 350 MG tablet  Commonly known as:  SOMA  TAKE 1 TABLET BY MOUTH EVERY 8 HOURS AS NEEDED FOR PAIN/MUSCLE SPASMS     carisoprodol 350 MG tablet  Commonly known as:  SOMA  Take 1 tablet (350 mg total) by mouth 3 (three) times daily.     ciprofloxacin-hydrocortisone otic suspension  Commonly known as:   CIPRO HC OTIC  Place 3 drops into the left ear 2 (two) times daily.     clonazePAM 2 MG tablet  Commonly known as:  KLONOPIN  Take 1 tablet (2 mg total) by mouth 2 (two) times daily as needed.     fentaNYL 50 MCG/HR  Commonly known as:  DURAGESIC  Place 1 patch (50 mcg total) onto the skin every 3 (three) days.     fentaNYL 75 MCG/HR  Commonly known as:  DURAGESIC - dosed mcg/hr  Place 1 patch (75 mcg total) onto the skin every 3 (three) days.     ferrous gluconate 324 MG tablet  Commonly known as:  FERGON  Take 1 tablet (324 mg total) by mouth 3 (three) times daily with meals.     gabapentin 300 MG capsule  Commonly known as:  NEURONTIN  Take 1 capsule (300 mg total) by mouth 2 (two) times daily.     hydrochlorothiazide 12.5 MG tablet  Commonly known as:  HYDRODIURIL  TAKE 1 TABLET (12.5 MG TOTAL) BY MOUTH AT BEDTIME.     HYDROcodone-acetaminophen 10-325 MG tablet  Commonly known as:  NORCO  Take 1 tablet by mouth every 8 (eight) hours as needed for severe pain.     HYDROcodone-acetaminophen 10-325 MG tablet  Commonly known as:  NORCO  Take 1-2 tablets by mouth every 4 (four) hours as needed (breakthrough pain).     levothyroxine 50 MCG tablet  Commonly known as:  SYNTHROID, LEVOTHROID  Take 1 tablet (50 mcg total) by mouth daily before breakfast.     meclizine 25 MG tablet  Commonly known as:  ANTIVERT  Take 25 mg by mouth 2 (two) times daily as needed for nausea.     nicotine 14 mg/24hr patch  Commonly known as:  NICODERM CQ - dosed in mg/24 hours  Place 1 patch (14 mg total) onto the skin daily.     omeprazole 20 MG capsule  Commonly known as:  PRILOSEC  Take 1 capsule (20 mg total) by mouth 2 (two) times daily before a meal.     pravastatin 40 MG tablet  Commonly known as:  PRAVACHOL  TAKE 1 TABLET BY MOUTH DAILY.     primidone 50 MG tablet  Commonly known as:  MYSOLINE  Take 1 tablet (50 mg total) by mouth 2 (two) times daily. 1 tab nightly for 3 days,  then 1 tab twice daily  thereafter     QUEtiapine 50 MG tablet  Commonly known as:  SEROQUEL  Take 100 mg by mouth at bedtime.     senna-docusate 8.6-50 MG tablet  Commonly known as:  Senokot-S  Take 2 tablets by mouth at bedtime as needed for mild constipation.     tiotropium 18 MCG inhalation capsule  Commonly known as:  SPIRIVA  Place 1 capsule (18 mcg total) into inhaler and inhale daily.     valACYclovir 500 MG tablet  Commonly known as:  VALTREX  TAKE 1 TABLET BY MOUTH DAILY.     VEGETABLE LAXATIVE PO  Take 2 tablets by mouth at bedtime.     venlafaxine XR 150 MG 24 hr capsule  Commonly known as:  EFFEXOR-XR  TAKE 2 CAPSULES (300 MG TOTAL) BY MOUTH DAILY WITH BREAKFAST.     vitamin B-12 250 MCG tablet  Commonly known as:  CYANOCOBALAMIN  Take 1 tablet (250 mcg total) by mouth daily.     VOLTAREN 1 % Gel  Generic drug:  diclofenac sodium  APPLY 2 GRAMS TO AFFECTED AREA 3 TIMES A DAY AS NEEDED FOR PAIN.        Diagnostic Studies: Dg Lumbar Spine Complete  08/01/2015  CLINICAL DATA:  Posterior lumbar spine fusion EXAM: LUMBAR SPINE - COMPLETE 4+ VIEW COMPARISON:  CT lumbar spine of 07/06/2014 FINDINGS: With limited field of view, evaluation of the exact level of posterior fusion is difficult. However a Ray cage is present, when compared to the prior lumbar spine CT, at the L3-4 level. Therefore it appears that posterior hardware for fusion is now present from T11 the L4. The screws at the L 2 level appear to be somewhat high in position. IMPRESSION: 1. Hardware for posterior fusion which appears to be from T11-L4 as described above. 2. Somewhat high position questioned concerning the screws at L2 level. Electronically Signed   By: Ivar Drape M.D.   On: 08/01/2015 13:52   Dg Chest Port 1 View  08/02/2015  CLINICAL DATA:  59 year old female postoperative fever. Recent spine surgery. Initial encounter. EXAM: PORTABLE CHEST 1 VIEW COMPARISON:  Intraoperative spine images  [id number]. Chest CT 11/23/2014 and earlier. FINDINGS: Portable AP semi upright view at 0718 hours. Stable lung volumes. Normal cardiac size and mediastinal contours. Allowing for portable technique, the lungs are clear. Revision of the thoracolumbar spine hardware since the previous chest radiographs noted. IMPRESSION: No acute cardiopulmonary abnormality. Electronically Signed   By: Genevie Ann M.D.   On: 08/02/2015 07:29   Dg C-arm Gt 120 Min  08/01/2015  CLINICAL DATA:  For posterior interbody fusion EXAM: DG C-ARM GT 120 MIN FLUOROSCOPY TIME:  Radiation Exposure Index (as provided by the fluoroscopic device): If the device does not provide the exposure index: Fluoroscopy Time (in minutes and seconds):  1 minutes 3 second Number of Acquired Images:  4 COMPARISON:  CT lumbar spine of 07/06/2014 FINDINGS: C-arm fluoroscopy was provided during lumbar interbody fusion. IMPRESSION: C-arm fluoroscopy provided. Electronically Signed   By: Ivar Drape M.D.   On: 08/01/2015 13:49    Disposition: 01-Home or Self Care      Discharge Instructions    Call MD / Call 911    Complete  by:  As directed   If you experience chest pain or shortness of breath, CALL 911 and be transported to the hospital emergency room.  If you develope a fever above 101 F, pus (white drainage) or increased drainage or redness at the wound, or calf pain, call your surgeon's office.     Constipation Prevention    Complete by:  As directed   Drink plenty of fluids.  Prune juice may be helpful.  You may use a stool softener, such as Colace (over the counter) 100 mg twice a day.  Use MiraLax (over the counter) for constipation as needed.     Diet - low sodium heart healthy    Complete by:  As directed      Discharge instructions    Complete by:  As directed   Call if there is increasing drainage, fever greater than 101.5, severe head aches, and worsening nausea or light sensitivity. If shortness of breath, bloody cough or chest tightness or  pain go to an emergency room. No lifting greater than 10 lbs. Avoid bending, stooping and twisting. Use brace when sitting and out of bed even to go to bathroom. Walk in house for first 2 weeks then may start to get out slowly increasing distances up to one quartermile by 4-6 weeks post op. After 5 days may shower and change dressing following bathing with shower.When bathing remove the brace shower and replace brace before getting out of the shower. If drainage, keep dry dressing and do not bathe the incision, use an moisture impervious dressing. Please call and return for scheduled follow up appointment 2 weeks from the time of surgery.     Driving restrictions    Complete by:  As directed   No driving for 3 weeks     Increase activity slowly as tolerated    Complete by:  As directed      Lifting restrictions    Complete by:  As directed   No lifting for 6 weeks           Follow-up Information    Follow up with Reyna Lorenzi E, MD In 2 weeks.   Specialty:  Orthopedic Surgery   Why:  For wound re-check   Contact information:   [place] [address] [phone]       Follow up with Ar[facility] of [place].   Contact information:   PT services [phone] They will call you to set up the appointment       Signed: Edan Juday E 08/07/2015, 9:17 AM

## 2015-08-08 DIAGNOSIS — R0689 Other abnormalities of breathing: Secondary | ICD-10-CM | POA: Diagnosis not present

## 2015-08-08 DIAGNOSIS — R4589 Other symptoms and signs involving emotional state: Secondary | ICD-10-CM | POA: Diagnosis not present

## 2015-08-08 DIAGNOSIS — M4325 Fusion of spine, thoracolumbar region: Secondary | ICD-10-CM | POA: Diagnosis not present

## 2015-08-08 DIAGNOSIS — Z791 Long term (current) use of non-steroidal anti-inflammatories (NSAID): Secondary | ICD-10-CM | POA: Diagnosis not present

## 2015-08-08 DIAGNOSIS — R0989 Other specified symptoms and signs involving the circulatory and respiratory systems: Secondary | ICD-10-CM | POA: Diagnosis not present

## 2015-08-08 DIAGNOSIS — R030 Elevated blood-pressure reading, without diagnosis of hypertension: Secondary | ICD-10-CM | POA: Diagnosis not present

## 2015-08-08 DIAGNOSIS — Z4889 Encounter for other specified surgical aftercare: Secondary | ICD-10-CM | POA: Diagnosis not present

## 2015-08-08 DIAGNOSIS — Z79891 Long term (current) use of opiate analgesic: Secondary | ICD-10-CM | POA: Diagnosis not present

## 2015-08-08 DIAGNOSIS — D62 Acute posthemorrhagic anemia: Secondary | ICD-10-CM | POA: Diagnosis not present

## 2015-08-10 ENCOUNTER — Encounter (HOSPITAL_COMMUNITY): Payer: Self-pay | Admitting: Specialist

## 2015-08-14 DIAGNOSIS — R030 Elevated blood-pressure reading, without diagnosis of hypertension: Secondary | ICD-10-CM | POA: Diagnosis not present

## 2015-08-14 DIAGNOSIS — Z79891 Long term (current) use of opiate analgesic: Secondary | ICD-10-CM | POA: Diagnosis not present

## 2015-08-14 DIAGNOSIS — Z791 Long term (current) use of non-steroidal anti-inflammatories (NSAID): Secondary | ICD-10-CM | POA: Diagnosis not present

## 2015-08-14 DIAGNOSIS — D62 Acute posthemorrhagic anemia: Secondary | ICD-10-CM | POA: Diagnosis not present

## 2015-08-14 DIAGNOSIS — R4589 Other symptoms and signs involving emotional state: Secondary | ICD-10-CM | POA: Diagnosis not present

## 2015-08-14 DIAGNOSIS — R0689 Other abnormalities of breathing: Secondary | ICD-10-CM | POA: Diagnosis not present

## 2015-08-14 DIAGNOSIS — R0989 Other specified symptoms and signs involving the circulatory and respiratory systems: Secondary | ICD-10-CM | POA: Diagnosis not present

## 2015-08-14 DIAGNOSIS — M4325 Fusion of spine, thoracolumbar region: Secondary | ICD-10-CM | POA: Diagnosis not present

## 2015-08-14 DIAGNOSIS — Z4889 Encounter for other specified surgical aftercare: Secondary | ICD-10-CM | POA: Diagnosis not present

## 2015-08-16 ENCOUNTER — Encounter (HOSPITAL_COMMUNITY): Payer: Self-pay | Admitting: Specialist

## 2015-08-16 DIAGNOSIS — Z4889 Encounter for other specified surgical aftercare: Secondary | ICD-10-CM | POA: Diagnosis not present

## 2015-08-16 DIAGNOSIS — R4589 Other symptoms and signs involving emotional state: Secondary | ICD-10-CM | POA: Diagnosis not present

## 2015-08-16 DIAGNOSIS — R0989 Other specified symptoms and signs involving the circulatory and respiratory systems: Secondary | ICD-10-CM | POA: Diagnosis not present

## 2015-08-16 DIAGNOSIS — R030 Elevated blood-pressure reading, without diagnosis of hypertension: Secondary | ICD-10-CM | POA: Diagnosis not present

## 2015-08-16 DIAGNOSIS — M4325 Fusion of spine, thoracolumbar region: Secondary | ICD-10-CM | POA: Diagnosis not present

## 2015-08-16 DIAGNOSIS — Z79891 Long term (current) use of opiate analgesic: Secondary | ICD-10-CM | POA: Diagnosis not present

## 2015-08-16 DIAGNOSIS — R0689 Other abnormalities of breathing: Secondary | ICD-10-CM | POA: Diagnosis not present

## 2015-08-16 DIAGNOSIS — D62 Acute posthemorrhagic anemia: Secondary | ICD-10-CM | POA: Diagnosis not present

## 2015-08-16 DIAGNOSIS — Z791 Long term (current) use of non-steroidal anti-inflammatories (NSAID): Secondary | ICD-10-CM | POA: Diagnosis not present

## 2015-08-17 ENCOUNTER — Other Ambulatory Visit: Payer: Self-pay | Admitting: Internal Medicine

## 2015-08-17 DIAGNOSIS — G4734 Idiopathic sleep related nonobstructive alveolar hypoventilation: Secondary | ICD-10-CM | POA: Diagnosis not present

## 2015-08-18 DIAGNOSIS — R0989 Other specified symptoms and signs involving the circulatory and respiratory systems: Secondary | ICD-10-CM | POA: Diagnosis not present

## 2015-08-18 DIAGNOSIS — M4325 Fusion of spine, thoracolumbar region: Secondary | ICD-10-CM | POA: Diagnosis not present

## 2015-08-18 DIAGNOSIS — R0689 Other abnormalities of breathing: Secondary | ICD-10-CM | POA: Diagnosis not present

## 2015-08-18 DIAGNOSIS — R4589 Other symptoms and signs involving emotional state: Secondary | ICD-10-CM | POA: Diagnosis not present

## 2015-08-18 DIAGNOSIS — D62 Acute posthemorrhagic anemia: Secondary | ICD-10-CM | POA: Diagnosis not present

## 2015-08-18 DIAGNOSIS — Z4889 Encounter for other specified surgical aftercare: Secondary | ICD-10-CM | POA: Diagnosis not present

## 2015-08-18 DIAGNOSIS — R030 Elevated blood-pressure reading, without diagnosis of hypertension: Secondary | ICD-10-CM | POA: Diagnosis not present

## 2015-08-18 DIAGNOSIS — Z791 Long term (current) use of non-steroidal anti-inflammatories (NSAID): Secondary | ICD-10-CM | POA: Diagnosis not present

## 2015-08-18 DIAGNOSIS — Z79891 Long term (current) use of opiate analgesic: Secondary | ICD-10-CM | POA: Diagnosis not present

## 2015-08-23 DIAGNOSIS — M5136 Other intervertebral disc degeneration, lumbar region: Secondary | ICD-10-CM | POA: Diagnosis not present

## 2015-08-23 DIAGNOSIS — M4806 Spinal stenosis, lumbar region: Secondary | ICD-10-CM | POA: Diagnosis not present

## 2015-09-05 ENCOUNTER — Ambulatory Visit: Payer: Medicare Other | Admitting: Physical Medicine & Rehabilitation

## 2015-09-06 ENCOUNTER — Encounter: Payer: Self-pay | Admitting: Internal Medicine

## 2015-09-06 ENCOUNTER — Ambulatory Visit (INDEPENDENT_AMBULATORY_CARE_PROVIDER_SITE_OTHER): Payer: Medicare Other | Admitting: Internal Medicine

## 2015-09-06 ENCOUNTER — Encounter: Payer: Medicare Other | Attending: Physical Medicine & Rehabilitation | Admitting: Physical Medicine & Rehabilitation

## 2015-09-06 VITALS — BP 143/92 | HR 86 | Temp 98.0°F | Ht 64.0 in | Wt 156.4 lb

## 2015-09-06 DIAGNOSIS — G2581 Restless legs syndrome: Secondary | ICD-10-CM | POA: Insufficient documentation

## 2015-09-06 DIAGNOSIS — Z0000 Encounter for general adult medical examination without abnormal findings: Secondary | ICD-10-CM

## 2015-09-06 DIAGNOSIS — G9619 Other disorders of meninges, not elsewhere classified: Secondary | ICD-10-CM | POA: Insufficient documentation

## 2015-09-06 DIAGNOSIS — Z720 Tobacco use: Secondary | ICD-10-CM | POA: Insufficient documentation

## 2015-09-06 DIAGNOSIS — Z9981 Dependence on supplemental oxygen: Secondary | ICD-10-CM | POA: Insufficient documentation

## 2015-09-06 DIAGNOSIS — M961 Postlaminectomy syndrome, not elsewhere classified: Secondary | ICD-10-CM | POA: Insufficient documentation

## 2015-09-06 DIAGNOSIS — E079 Disorder of thyroid, unspecified: Secondary | ICD-10-CM | POA: Insufficient documentation

## 2015-09-06 DIAGNOSIS — D62 Acute posthemorrhagic anemia: Secondary | ICD-10-CM | POA: Diagnosis not present

## 2015-09-06 DIAGNOSIS — Z5181 Encounter for therapeutic drug level monitoring: Secondary | ICD-10-CM | POA: Insufficient documentation

## 2015-09-06 DIAGNOSIS — Z9889 Other specified postprocedural states: Secondary | ICD-10-CM | POA: Insufficient documentation

## 2015-09-06 DIAGNOSIS — M170 Bilateral primary osteoarthritis of knee: Secondary | ICD-10-CM | POA: Insufficient documentation

## 2015-09-06 DIAGNOSIS — E785 Hyperlipidemia, unspecified: Secondary | ICD-10-CM | POA: Insufficient documentation

## 2015-09-06 DIAGNOSIS — I10 Essential (primary) hypertension: Secondary | ICD-10-CM

## 2015-09-06 DIAGNOSIS — Z79899 Other long term (current) drug therapy: Secondary | ICD-10-CM | POA: Insufficient documentation

## 2015-09-06 DIAGNOSIS — M5126 Other intervertebral disc displacement, lumbar region: Secondary | ICD-10-CM | POA: Insufficient documentation

## 2015-09-06 DIAGNOSIS — G894 Chronic pain syndrome: Secondary | ICD-10-CM | POA: Insufficient documentation

## 2015-09-06 DIAGNOSIS — E038 Other specified hypothyroidism: Secondary | ICD-10-CM

## 2015-09-06 DIAGNOSIS — J449 Chronic obstructive pulmonary disease, unspecified: Secondary | ICD-10-CM | POA: Insufficient documentation

## 2015-09-06 DIAGNOSIS — K219 Gastro-esophageal reflux disease without esophagitis: Secondary | ICD-10-CM | POA: Insufficient documentation

## 2015-09-06 DIAGNOSIS — M2170 Unequal limb length (acquired), unspecified site: Secondary | ICD-10-CM | POA: Insufficient documentation

## 2015-09-06 DIAGNOSIS — K5901 Slow transit constipation: Secondary | ICD-10-CM

## 2015-09-06 DIAGNOSIS — F1190 Opioid use, unspecified, uncomplicated: Secondary | ICD-10-CM | POA: Insufficient documentation

## 2015-09-06 DIAGNOSIS — E039 Hypothyroidism, unspecified: Secondary | ICD-10-CM | POA: Insufficient documentation

## 2015-09-06 MED ORDER — NALOXONE HCL 0.4 MG/ML IJ SOLN: 0.4000 mg | INTRAMUSCULAR | Status: DC | PRN

## 2015-09-06 NOTE — Progress Notes (Signed)
Subjective:    Patient ID: Jill Phillips, female    DOB: [birth year], 60 y.o.   MRN: [id number]  HPI Jill Phillips is a 60 y.o. female with PMHx of HTN, constipation, hypothyroidism who presents to the clinic for follow up for HTN. Please see A&P for the status of the patient's chronic medical problems.   Past Medical History  Diagnosis Date  . Postlaminectomy syndrome, thoracic region   . Osteoarthrosis, unspecified whether generalized or localized, lower leg   . Dysthymic disorder   . Calcifying tendinitis of shoulder   . Pain in joint, upper arm   . Chronic pain syndrome   . Lumbago   . Primary localized osteoarthrosis, lower leg   . Hypertension   . Hyperlipidemia   . GERD (gastroesophageal reflux disease)   . Thyroid disease   . Restless leg syndrome   . History of blood transfusion 1980  . Hypothyroidism   . Active smoker   . COPD (chronic obstructive pulmonary disease) ([place])     on nocturnal home o2  . Heart murmur     for years, nothing to be concerned about  . Sleep apnea     s/p surgery- last sleep study 2011- doesnt use oxygen or machine at night as instructed,.   12/2014- Dr Halford Chessman  reports it is negative.  [name] Anxiety   . Herpes genitalia   . PONV (postoperative nausea and vomiting)     gets nauseous  with longer surgery. Difficuty voiding after surgery    Outpatient Encounter Prescriptions as of 09/06/2015  Medication Sig Note  . acetaminophen (TYLENOL) 500 MG tablet Take 1,000 mg by mouth every 6 (six) hours as needed for headache.    . albuterol (PROVENTIL HFA;VENTOLIN HFA) 108 (90 BASE) MCG/ACT inhaler Inhale 1 puff into the lungs every 6 (six) hours as needed for wheezing or shortness of breath.   . ALPRAZolam (XANAX) 1 MG tablet Take 1 tablet (1 mg total) by mouth 2 (two) times daily as needed for anxiety. (Patient taking differently: Take 1 mg by mouth 2 (two) times daily as needed for anxiety (depression). ) 07/26/2015: Prescribed by PA at psychiatrist's  office - per pharmacy filled regularly. #60 filled 07/11/15  . amLODipine (NORVASC) 10 MG tablet TAKE 1 TABLET (10 MG TOTAL) BY MOUTH DAILY.   . benazepril (LOTENSIN) 40 MG tablet TAKE 1 TABLET BY MOUTH DAILY.   . carisoprodol (SOMA) 350 MG tablet TAKE 1 TABLET BY MOUTH EVERY 8 HOURS AS NEEDED FOR PAIN/MUSCLE SPASMS   . carisoprodol (SOMA) 350 MG tablet Take 1 tablet (350 mg total) by mouth 3 (three) times daily.   . ciprofloxacin-hydrocortisone (CIPRO HC OTIC) otic suspension Place 3 drops into the left ear 2 (two) times daily.   . clonazePAM (KLONOPIN) 2 MG tablet Take 1 tablet (2 mg total) by mouth 2 (two) times daily as needed. (Patient taking differently: Take 2 mg by mouth 2 (two) times daily as needed (restless legs). ) 07/26/2015: Pt states that if she doesn't take the morning dose she will take 2 tablets at bedtime but lately she has been taking the morning dose and the evening dose.  . fentaNYL (DURAGESIC - DOSED MCG/HR) 75 MCG/HR Place 1 patch (75 mcg total) onto the skin every 3 (three) days.   . fentaNYL (DURAGESIC) 50 MCG/HR Place 1 patch (50 mcg total) onto the skin every 3 (three) days.   . ferrous gluconate (FERGON) 324 MG tablet Take 1 tablet (324 mg total)  by mouth 3 (three) times daily with meals.   . gabapentin (NEURONTIN) 300 MG capsule Take 1 capsule (300 mg total) by mouth 2 (two) times daily. (Patient taking differently: Take 300 mg by mouth 2 (two) times daily as needed (restless legs). )   . hydrochlorothiazide (HYDRODIURIL) 12.5 MG tablet TAKE 1 TABLET (12.5 MG TOTAL) BY MOUTH AT BEDTIME.   [name] HYDROcodone-acetaminophen (NORCO) 10-325 MG tablet Take 1 tablet by mouth every 8 (eight) hours as needed for severe pain.   [name] HYDROcodone-acetaminophen (NORCO) 10-325 MG tablet Take 1-2 tablets by mouth every 4 (four) hours as needed (breakthrough pain).   [name] levothyroxine (SYNTHROID, LEVOTHROID) 50 MCG tablet Take 1 tablet (50 mcg total) by mouth daily before breakfast. 07/26/2015: Per  pt Dr. Naaman Plummer suggested 100 mcg but PCP told pt to stay at 50 mcg daily  . meclizine (ANTIVERT) 25 MG tablet Take 25 mg by mouth 2 (two) times daily as needed for nausea.   . nicotine (NICODERM CQ - DOSED IN MG/24 HOURS) 14 mg/24hr patch Place 1 patch (14 mg total) onto the skin daily. (Patient not taking: Reported on 07/26/2015)   . omeprazole (PRILOSEC) 20 MG capsule TAKE 1 CAPSULE BY MOUTH 2 TIMES DAILY BEFORE A MEAL.   . pravastatin (PRAVACHOL) 40 MG tablet TAKE 1 TABLET BY MOUTH DAILY. (Patient taking differently: TAKE 1 TABLET BY MOUTH DAILY AT BEDTIME)   . primidone (MYSOLINE) 50 MG tablet Take 1 tablet (50 mg total) by mouth 2 (two) times daily. 1 tab nightly for 3 days, then 1 tab twice daily  thereafter   . Psyllium (VEGETABLE LAXATIVE PO) Take 2 tablets by mouth at bedtime.   [name] QUEtiapine (SEROQUEL) 50 MG tablet Take 100 mg by mouth at bedtime.    . senna-docusate (SENOKOT-S) 8.6-50 MG per tablet Take 2 tablets by mouth at bedtime as needed for mild constipation. (Patient not taking: Reported on 07/26/2015) 07/26/2015: Pt recalls having this in the past but has lost the bottle.  . tiotropium (SPIRIVA) 18 MCG inhalation capsule Place 1 capsule (18 mcg total) into inhaler and inhale daily. (Patient taking differently: Place 18 mcg into inhaler and inhale daily as needed (shortness of breath). ) 07/26/2015: Pt states this is prescribed daily but she takes it as needed.  . valACYclovir (VALTREX) 500 MG tablet TAKE 1 TABLET BY MOUTH DAILY.   [name] venlafaxine XR (EFFEXOR-XR) 150 MG 24 hr capsule TAKE 2 CAPSULES (300 MG TOTAL) BY MOUTH DAILY WITH BREAKFAST. (Patient taking differently: TAKE 1 CAPSULE (150 MG) BY MOUTH TWICE DAILY -BREAKFAST AND SUPPER)   . vitamin B-12 (CYANOCOBALAMIN) 250 MCG tablet Take 1 tablet (250 mcg total) by mouth daily. (Patient not taking: Reported on 07/26/2015)   . VOLTAREN 1 % GEL APPLY 2 GRAMS TO AFFECTED AREA 3 TIMES A DAY AS NEEDED FOR PAIN. (Patient taking differently:  APPLY  TO AFFECTED AREA 3 TIMES A DAY AS NEEDED FOR PAIN.) 07/26/2015: Pt doesn't measure the dose   No facility-administered encounter medications on file as of 09/06/2015.    Family History  Problem Relation Age of Onset  . Kidney disease Mother   . Heart disease Father   . Anuerysm Brother 29    brain  . Heart disease Brother   . Heart disease Sister 36    s/p CABG  . Hypertension Sister     Social History   Social History  . Marital Status: Divorced    Spouse Name: n/a  . Number of Children: 2  .  Years of Education: 12+   Occupational History  . disability     back surgeries   Social History Main Topics  . Smoking status: Current Every Day Smoker -- 1.25 packs/day for 45 years    Types: Cigarettes  . Smokeless tobacco: Never Used     Comment: trying to quit  . Alcohol Use: No  . Drug Use: No  . Sexual Activity: Not on file   Other Topics Concern  . Not on file   Social History Narrative   Lives alone.  One daughter is local, but is getting ready to move to [place], where her children live with their father.  The other daughter lives near [place], [place].   Review of Systems General: Admits to fatigue. Denies change in appetite and diaphoresis.  Respiratory: Denies SOB, cough, DOE.   Cardiovascular: Denies chest pain and palpitations.  Gastrointestinal: Denies nausea, vomiting, abdominal pain, diarrhea, constipation Musculoskeletal: Admits to back pain (chronic). Denies myalgias, joint swelling Neurological: Denies dizziness, headaches, weakness, lightheadedness, and syncope, Psychiatric/Behavioral: Denies confusion     Objective:   Physical Exam Filed Vitals:   09/06/15 1601  BP: 143/92  Pulse: 86  Temp: 98 F (36.7 C)  TempSrc: Oral  Height: 5\' 4"  (1.626 m)  Weight: 156 lb 6.4 oz (70.943 kg)  SpO2: 96%   General: Vital signs reviewed.  Patient is chronically ill appearing, in no acute distress and cooperative with exam.  Head: Normocephalic and  atraumatic. Eyes: PERRLA Neck: Supple, no thyromegaly Cardiovascular: RRR, S1 normal, S2 normal, no murmurs, gallops, or rubs. Pulmonary/Chest: Clear to auscultation bilaterally, no wheezes, rales, or rhonchi. Back brace in place. Extremities: No lower extremity edema bilaterally Neurological: A&O x3 Psychiatric: Somnolent, lethargic.      Assessment & Plan:   Please see problem based assessment and plan.

## 2015-09-06 NOTE — Patient Instructions (Signed)
STOP TAKING HYDROCHLOROTHIAZIDE.  IF YOU BLOOD PRESSURE IS LESS THAN 100/60, LET ME KNOW.  IF YOUR BLOOD PRESSURE STARTS RUNNING MORE THAN 140/90, PLEASE LET ME KNOW.  Naloxone injection What is this medicine? NALOXONE (nal OX one) is a narcotic blocker. It is used to treat narcotic drug overdose. This medicine may be used for other purposes; ask your health care provider or pharmacist if you have questions. What should I tell my health care provider before I take this medicine? They need to know if you have any of these conditions: -drug abuse or addiction -heart disease -an unusual or allergic reaction to naloxone, other medicines, foods, dyes, or preservatives -pregnant or trying to get pregnantbreast-feeding How should I use this medicine? This medicine is for injection into the outer thigh. It can be injected through clothing if needed. Get emergency medical help right away after giving the first dose of this medicine, even if the person wakes up. You should be familiar with how to recognize the signs and symptoms of a narcotic overdose. Administer according to the printed instructions on the device label or the electronic voice instructions. You should practice using the Trainer injector before this medicine is needed. Talk to your pediatrician regarding the use of this medicine in children. While this drug may be prescribed for children as young as newborn for selected conditions, precautions do apply. For infants less than 1 year of age, pinch the thigh muscle while administering. Overdosage: If you think you have taken too much of this medicine contact a poison control center or emergency room at once. NOTE: This medicine is only for you. Do not share this medicine with others. What if I miss a dose? This does not apply. What may interact with this medicine? This medicine is only used during an emergency. No interactions are expected during emergency use. This list may not describe  all possible interactions. Give your health care provider a list of all the medicines, herbs, non-prescription drugs, or dietary supplements you use. Also tell them if you smoke, drink alcohol, or use illegal drugs. Some items may interact with your medicine. What should I watch for while using this medicine? Keep this medicine ready for use in the case of a narcotic overdose. Make sure that you have the phone number of your doctor or health care professional and local hospital ready. You may need to have additional doses of this medicine. Each injector contains a single dose. Some emergencies may require additional doses. After use, bring the treated person to the nearest hospital or call 911. Make sure the treating health care professional knows that the person has received an injection of this medicine. You will receive additional instructions on what to do during and after use of this medicine before an emergency occurs. What side effects may I notice from receiving this medicine? Side effects that you should report to your doctor or health care professional as soon as possible: -allergic reactions like skin rash, itching or hives, swelling of the face, lips, or tongue -breathing problems -fast, irregular heartbeat -high blood pressure -pain that was controlled by narcotic pain medicine -seizures Side effects that usually do not require medical attention (report to your doctor or health care professional if they continue or are bothersome): -anxious -chills -diarrhea -fever -nausea, vomiting -sweating This list may not describe all possible side effects. Call your doctor for medical advice about side effects. You may report side effects to FDA at 1-800-FDA-1088. Where should I keep my medicine?  Keep out of the reach of children. Store at room temperature between 15 and 25 degrees C (59 and 77 degrees F). Keep this medicine in its outer case until ready to use. Occasionally check the  solution through the viewing window of the injector. The solution should be clear. If it is discolored, cloudy, or contains solid particles, replace it with a new injector. Remember to check the expiration date of this medicine regularly. Throw away any unused medicine after the expiration date. NOTE: This sheet is a summary. It may not cover all possible information. If you have questions about this medicine, talk to your doctor, pharmacist, or health care provider.    2016, Elsevier/Gold Standard. (2014-07-26 11:04:51)

## 2015-09-07 MED ORDER — LEVOTHYROXINE SODIUM 75 MCG PO TABS: 75.0000 ug | ORAL_TABLET | Freq: Every day | ORAL | Status: DC

## 2015-09-07 MED ORDER — FERROUS GLUCONATE 324 (38 FE) MG PO TABS: 324.0000 mg | ORAL_TABLET | Freq: Three times a day (TID) | ORAL | Status: DC

## 2015-09-07 NOTE — Assessment & Plan Note (Signed)
Patient reports her constipation has improved and she is not having any current issues.   Plan: -Resolved

## 2015-09-07 NOTE — Assessment & Plan Note (Signed)
Patient is currently on Fentanyl patch 75 mcg Q3 days and hydrocodone 10 mg Q6H prn. She is also on several sedating medications including klonopin, soma, and gabapentin. Patient is very somnolent in the exam room, falling asleep intermittently. She is not confused however, and completely oriented. There are several factors contributing to her fatigue- poor sleep, medication, hypothyroidism, and anemia. Patient is on 220 morphine equivalents per day, putting her at very high risk of overdose. We discussed Narcan administration for home use and she is agreeable. Her daughter is currently living with her and can be taught if necessary. Pain management is currently managed by Dr. Tessa Lerner, but fentanyl recently increased by her orthopedic surgeon.   Plan: -Discussed cutting back on opioids and other sedating medications- just education today -Prescribed Narcan for home use

## 2015-09-07 NOTE — Progress Notes (Signed)
Internal Medicine Clinic Attending  Case discussed with Dr. Richardson soon after the resident saw the patient.  We reviewed the resident's history and exam and pertinent patient test results.  I agree with the assessment, diagnosis, and plan of care documented in the resident's note. 

## 2015-09-07 NOTE — Assessment & Plan Note (Signed)
Most recent CBCs have been in the 8s after surgery in November. Previously, Hemoglobin was around 10. Anemia is likely contributing to fatigue. Patient reports poor compliance with iron supplementation. Unfortunately, patient left without obtaining CBC and iron studies.  Plan: -Ferrous sulfate 325 mg TID -Repeat CBC/iron at follow up

## 2015-09-07 NOTE — Assessment & Plan Note (Signed)
Patient reports compliance with levothyroxine 50 mcg daily. Last TSH elevated at 5.210 with previous also elevated. Will increase levothyroxine.  Plan: -Increase levothyroxine to 75 mcg daily

## 2015-09-07 NOTE — Assessment & Plan Note (Signed)
BP Readings from Last 3 Encounters:  09/06/15 143/92  08/05/15 85/50  07/24/15 140/67    Lab Results  Component Value Date   NA 137 08/04/2015   K 3.2* 08/04/2015   CREATININE 1.13* 08/04/2015    Assessment: Blood pressure control:  Controlled Progress toward BP goal:   At goal Comments: Although 143/92 in the clinic, patient reports lower BPs at home in the 100s/70s and she occasionally becomes lightheaded when she stands up. Her reduced BP is likely in the setting of increased opioid use since her surgery.   Plan: Medications:  Discontinue HCTZ 12.5 mg daily. Continue amlodipine 10 mg daily and benazepril 40 mg daily.  Educational resources provided: brochure, handout, video Self management tools provided:   Other plans: Patient will inform us if her BP continues to be less than 110/70 or if climbing above 150/90.

## 2015-09-15 ENCOUNTER — Other Ambulatory Visit: Payer: Self-pay | Admitting: Internal Medicine

## 2015-09-17 DIAGNOSIS — G4734 Idiopathic sleep related nonobstructive alveolar hypoventilation: Secondary | ICD-10-CM | POA: Diagnosis not present

## 2015-09-19 ENCOUNTER — Telehealth: Payer: Self-pay | Admitting: Internal Medicine

## 2015-09-19 NOTE — Telephone Encounter (Signed)
  INTERNAL MEDICINE RESIDENCY PROGRAM After-Hours Telephone Call    Reason for call:   I received a call from [name] at 9:44 PM, 09/19/2015 indicating her blood pressure was low and she felt lightheaded. Patient checked her blood pressure in the evening at it was 90/63. She proceeded to take her normal nighttime medications and rechecked her blood pressure which was 79/56. At this point, she called the on-call pager. Patient admits to lightheadedness, but denies confusion, weakness, nausea, fever, chills, or pre-syncopal symptoms. She was seen in my clinic 2 weeks ago at which point her blood pressure was 143/92; however, she reported lightheadedness and low blood pressures at home in the 100/70s. Etiology is likely due to multiple anti-hypertensives, muscles relaxers, opioids and antidepressants. At the visit on 09/06/2015, HCTZ 12.5 mg daily was discontinued. Patient was to follow up with her surgeon and pain management physician for further adjustment of her medications.   Patient does not want to come in to the hospital for further evaluation. At this point, unfortunately, patient has already taken amlodipine, benazepril, effexor, and klonopin. She is currently wearing her fentanyl patch, but does not want to remove it due to her restless legs syndrome.   While on the phone, patient repeats her blood pressure and it is 79/63. MAP is 68.     Pertinent Data:   Hypotension, lightheadedness    Assessment / Plan / Recommendations:   I recommended patient come into the ED for evaluation and monitoring; however, patient refuses at this time. She has her daughter at home with her.  I recommended patient drink plenty of water and salty foods and I will call her back in one hour. If blood pressure has not improved, I will again recommend she come to the ED.   Either way, patient should not take her amlodipine or benazepril until she is evaluated in the clinic this week. I will send a  message to our clinic staff to try to schedule her this week.   As always, pt is advised that if symptoms worsen or new symptoms arise, they should go to an urgent care facility or to to ER for further evaluation.    Osa Craver, DO PGY-2 Internal Medicine Resident Pager # [phone] 09/19/2015 9:44 PM

## 2015-09-19 NOTE — Telephone Encounter (Signed)
  INTERNAL MEDICINE RESIDENCY PROGRAM After-Hours Telephone Call    Reason for call:   I returned a call from [name] at 10:55 PM, 09/19/2015 to follow up on her hypotension and lightheadedness. Patient states after our phone call she drank gatorade and ate fritos. She states her lightheadedness has improved. She rechecked her blood pressure while on the phone with me- it had improved to 100/61.    Pertinent Data:   None    Assessment / Plan / Recommendations:   Hypotension secondary to multiple medication side effects. Please see previous note.   Hypotension has improved with fluid and salt liberation. There is not an urgent indication for patient to come into the ED at this time; however, she should hold her benazepril or amlodipine until she can be seen in our clinic. If she develops recurrent symptoms, patient she come into the ED. Her daughter should check on her periodically overnight.   As always, pt is advised that if symptoms worsen or new symptoms arise, they should go to an urgent care facility or to to ER for further evaluation.    Osa Craver, DO PGY-2 Internal Medicine Resident Pager # [phone] 09/19/2015 11:02 PM

## 2015-09-28 ENCOUNTER — Other Ambulatory Visit: Payer: Self-pay | Admitting: Physical Medicine & Rehabilitation

## 2015-10-13 ENCOUNTER — Ambulatory Visit (INDEPENDENT_AMBULATORY_CARE_PROVIDER_SITE_OTHER): Payer: Medicare Other | Admitting: Internal Medicine

## 2015-10-13 ENCOUNTER — Encounter: Payer: Self-pay | Admitting: Internal Medicine

## 2015-10-13 ENCOUNTER — Other Ambulatory Visit: Payer: Self-pay | Admitting: Pulmonary Disease

## 2015-10-13 ENCOUNTER — Other Ambulatory Visit: Payer: Self-pay | Admitting: Physical Medicine & Rehabilitation

## 2015-10-13 VITALS — BP 161/92 | HR 83 | Temp 98.5°F | Ht 64.0 in | Wt 157.0 lb

## 2015-10-13 DIAGNOSIS — R05 Cough: Secondary | ICD-10-CM | POA: Diagnosis not present

## 2015-10-13 DIAGNOSIS — F17200 Nicotine dependence, unspecified, uncomplicated: Secondary | ICD-10-CM

## 2015-10-13 DIAGNOSIS — I10 Essential (primary) hypertension: Secondary | ICD-10-CM

## 2015-10-13 DIAGNOSIS — J069 Acute upper respiratory infection, unspecified: Secondary | ICD-10-CM

## 2015-10-13 MED ORDER — GUAIFENESIN-DM 100-10 MG/5ML PO SYRP: 5.0000 mL | ORAL_SOLUTION | ORAL | Status: DC | PRN

## 2015-10-13 MED ORDER — HYDROCHLOROTHIAZIDE 12.5 MG PO CAPS: 12.5000 mg | ORAL_CAPSULE | Freq: Every day | ORAL | Status: DC

## 2015-10-13 NOTE — Progress Notes (Signed)
Patient ID: Jill Phillips, female   DOB: [birth year], 60 y.o.   MRN: [id number]   Subjective:   Patient ID: Jill Phillips female   DOB: 01-Sep-1956 60 y.o.   MRN: [id number]  HPI: Jill Phillips is a 60 y.o. presented with c/o persistent cough for 1 month. She also has associated nasal congestion. Cough is productive of yellow sputum, which she says is getting clearer, but the cough is the same She initially had sorethroat, myalgias. She denies fever, chills, malaise or SOB. Occasionally has wheezing, but not today. She uses her inhalers- Albuterol and Spiriva.  She also has some complaints of back pain after her back surgery and will be seeing her orthopedist soon to address it.  Past Medical History  Diagnosis Date  . Postlaminectomy syndrome, thoracic region   . Osteoarthrosis, unspecified whether generalized or localized, lower leg   . Dysthymic disorder   . Calcifying tendinitis of shoulder   . Pain in joint, upper arm   . Chronic pain syndrome   . Lumbago   . Primary localized osteoarthrosis, lower leg   . Hypertension   . Hyperlipidemia   . GERD (gastroesophageal reflux disease)   . Thyroid disease   . Restless leg syndrome   . History of blood transfusion 1980  . Hypothyroidism   . Active smoker   . COPD (chronic obstructive pulmonary disease) ([place])     on nocturnal home o2  . Heart murmur     for years, nothing to be concerned about  . Sleep apnea     s/p surgery- last sleep study 2011- doesnt use oxygen or machine at night as instructed,.   12/2014- Dr Halford Chessman  reports it is negative.  [name] Anxiety   . Herpes genitalia   . PONV (postoperative nausea and vomiting)     gets nauseous  with longer surgery. Difficuty voiding after surgery   Review of Systems: CONSTITUTIONAL- No Fever, or change in appetite. SKIN- No Rash, colour changes or itching. HEAD- No Headache or dizziness. Mouth/throat- No Sorethroat RCARDIAC- No Palpitations, or chest pain. GI- No  vomiting,  diarrhoea, abd pain. URINARY- No Frequency or dysuria. NEUROLOGIC- No Numbness, or burning. [facility]- Denies depression or anxiety.  Objective:  Physical Exam: Filed Vitals:   10/13/15 1150  BP: 161/92  Pulse: 83  Temp: 98.5 F (36.9 C)  TempSrc: Oral  Height: 5\' 4"  (1.626 m)  Weight: 157 lb (71.215 kg)  SpO2: 92%   GENERAL- alert, co-operative, appears as stated age, not in any distress. HEENT- Atraumatic, normocephalic, PERRL, no pharyngeal erythema, no cervical LN, neck supple. CARDIAC- RRR, no murmurs, rubs or gallops. RESP- Moving equal volumes of air, and clear to auscultation bilaterally, no wheezes or crackles ( initially wheeze in RLQ cleared after coughing) ABDOMEN- Soft, nontender, no guarding or rebound, bowel sounds present. BACK- wear a bulky hard vest, after her back surgery, given to her by her orthopedist NEURO- No obvious Cr N abnormality,  Gait- abnormal, due to back pain EXTREMITIES- warm, no pedal edema. SKIN- Warm, dry, No rash or lesion. PSYCH- Normal mood and affect, appropriate thought content and speech.  Assessment & Plan:  The patient's case and plan of care was discussed with attending physician, Dr. Daryll Drown.  URTI- with persistent cough only, no SOB, no Wheeze. No fever. Doubt COPD exacerbation, O2 sats- 92%, Most likely persistent viral infection. Sputum purulence present but this is clearing off. - Guaifenasin-dextromethophan 22mls Q4h - Return precautions given. - Cont albuterol inh  and spiriva.  Please see problem based charting for assessment and plan.

## 2015-10-13 NOTE — Patient Instructions (Signed)
We have prescribed a medication called Guaifenasin-dextromethophan- it is a syrup, take 32mls as needed every 4 hours. This should help.   It is important that you quit smoking. Let us know when you are ready, please consider this, as there re a variety of methods to assist you.   If you feel worse or have a fever, having difficulty breathing, then let us know.  For your blood pressure- Start taking the HCTZ- 12.5mg  once a day. We will see you in 2-3 weeks to check if this is okay or you need more medications for your blood pressure.   It was nice seeing you today.

## 2015-10-13 NOTE — Assessment & Plan Note (Signed)
BP Readings from Last 3 Encounters:  10/13/15 161/92  09/06/15 143/92  08/05/15 85/50    Lab Results  Component Value Date   NA 137 08/04/2015   K 3.2* 08/04/2015   CREATININE 1.13* 08/04/2015    Assessment: Blood pressure control: elevated Comments: she did not take her Bp meds today or yesterday. She has been taking it now only when it is high. She was previously on NOrvasc 10, Benazepril- 40 and HCTZ- 25. She was hypotensive and called the on call pager but she never followed up in clinic.  Other plans: Told patient she has to take her Bp meds everyday even if it is normal, as that is what the Bp med is suppose to do, unless it is low or she is dizzy , then we will make adjustments but she has to let us know that. She is not supposed to take it only when it is high. - Will restarted med- HCTZ- 12.5mg  - see in 2-3 weeks, can gradually add meds back on

## 2015-10-18 ENCOUNTER — Telehealth: Payer: Self-pay

## 2015-10-18 ENCOUNTER — Telehealth: Payer: Self-pay | Admitting: Internal Medicine

## 2015-10-18 ENCOUNTER — Telehealth: Payer: Self-pay | Admitting: *Deleted

## 2015-10-18 DIAGNOSIS — G4734 Idiopathic sleep related nonobstructive alveolar hypoventilation: Secondary | ICD-10-CM | POA: Diagnosis not present

## 2015-10-18 NOTE — Telephone Encounter (Signed)
Patient called our clinic concerned about episodes of symptomatic hypotension that occur primarily at night. This has been documented in prior telephone notes on several occasions. There is confusion surrounding what is causing her hypotension. As far as antihypertensives, patient is supposed to be on amlodipine 10 mg daily, benazepril 40 mg daily. HCTZ 12.5 mg daily was recently restarted in the [facility] clinic as patient was hypertensive at 161/92. Patient self-admittedly was not taking her medications as prescribed and only when her BP is high. Her BP of 163/92 was when she was not taking her medications per the clinic note on 10/13/15. Prior BP readings showed 143/92 on 09/06/15 and 85/50 on 08/05/15. Per my previous note, I agree with holding HCTZ for now and having patient take her amlodipine 10 mg QAM and benazepril 40 mg QAM as she previously did. She should follow up with [place] in clinic so we can assess her BP during the day on this medication regimen. If her BP remains normotensive or hypertensive during the day and she continues to have symptomatic hypotension at nighttime, then we can more confidently attribute her symptoms and hypotension to her pain medications.   Unfortunately, Ms. Drechsler suffers from chronic pain and requires pain medication to control her symptoms for a reasonable quality of life. I am grateful for the care provided by Dr. Naaman Plummer and Dr. [name] for her chronic pain. Our current records indicate patient is taking Soma 350 mg TID, Klonopin 2 mg BID prn, Fentanyl 75 mcg patch, Neurontin 300 mg BID, Norco 10-325 mg Q8H, and Primidone 50 mg BID. Patient has an appointment to follow up with Dr. [name] tomorrow to assess her pain and for continued medication reconciliation. We appreciate input concerning if the above medications could be contributing to her nighttime hypotensive episodes and if there are any possible adjustments that could be made.   [facility] will see Jill Phillips for further  evaluation of her blood pressure as stated above and we will work closely with her other physicians to provide care for this complicated patient.   [name], will you help me set this patient up with another appointment in our clinic? Thank you!  Osa Craver, DO PGY-2 Internal Medicine Resident Pager # [phone] 10/18/2015 5:27 PM

## 2015-10-18 NOTE — Telephone Encounter (Signed)
Yes, I agree with holding HCTZ. I do feel a large driver of her hypotension is the multiple analgesics she is on. It will be beneficial for her to see Dr. [name] to see if we can discontinue any of these medications or taper down.

## 2015-10-18 NOTE — Telephone Encounter (Signed)
This patient was in the office for HIGH BP 5 days ago and medication was RESTARTED (HCTZ).  The internal medicine team needs to reconcile their own meds (ie norvasc, hctz, benazepril) and figure out if she is in fact taking these now before we start pulling off meds being prescribed from this office

## 2015-10-18 NOTE — Telephone Encounter (Signed)
Spoke with patient.  She has an appointment with Dr. [name] tomorrow at [place], [place] reiterated importance of going to this appointment and letting him know what has been happening with BP.  Gave education on hypotension and effects of decreased perfusion.  Advised pt that if her BP should go back down to readings like last night she should come to ED or call 911.

## 2015-10-18 NOTE — Telephone Encounter (Signed)
Spoke with patient.  She has been scheduled for 2/21 at 2:45.  Pt again stated that she has not been taking her BP medications, stopped the Benazepril and Amolodipine in Jan.   Per Dr. Marvel Plan I have instructed her to start back Benazepril 40mg  in the morning.  Patient is on board with getting all of her medications on better track to avoid the episodes she has been having. She is to follow-up with [place] next week so we can see how her BP is doing.  She is aware to call with any additional concerns and voiced understanding of need to seek immediate attention with symptomatic hypotension.

## 2015-10-18 NOTE — Telephone Encounter (Signed)
RN rec'd call from patient at approx. 2:05pm.  Pt reporting hypotension overnight last night with BP readings: 54/52 and 71/57, reports feeling dizzy so much that she cannot get up even today.  Pt lives alone.  Today patient latest BP at approx. 1:30pm 105/78.  Last night she reports eating some salt and drinking a Gatorade as she was previously directed during an episode of hypotension.    Initially pt reports concern that she was restarted on her HCTZ 12.5mg  during last OV on 2/10.  As the conversation went on pt admitted that episode of hypotension happened "and I hadn't even taken my blood pressure medicine yesterday."  Pt reports taking her "muscle relaxer, soma, primidone, half of a hydrocodone" and had her fentanyl patch on as prescribed.  It is unclear if she had taken her dose of Seroquel. This RN advised pt not to take any HCTZ until further direction from PCP. I spoke with PCP Dr. Marvel Plan who confirms patient should continue to hold HCTZ.    Rn outreached to Dr. Naaman Plummer office who manages pain medications, I have been directed to Dr. Arvil Persons office since pt is still under his care for her back.  Pt needs some sort of medication reconciliation to help her avoid the compounding effects of her medication. Will outreach to Dr. Otho Ket office for advice.

## 2015-10-18 NOTE — Telephone Encounter (Signed)
Dr Jill Phillips office called about Jill Phillips being hypotensive and wanting [place] to see her to reconcile her medications because they are not the prescriber and cannot take her off some of these medications.  They report that she called them being hypotensive the night before [id number] systolic though she is reporting 100s during the day.  She was told to go to the ED and did not go. I am not sure what you want [place] to do with this situation.  Do you want to bring her in and removed medications or can you just call Dr Jill Phillips and discuss Phillips?  Jill Phillips is currently prescrbing the narcotics.  (Richardson's office # is [phone])

## 2015-10-18 NOTE — Telephone Encounter (Signed)
Notified Truman Hayward of Dr Naaman Plummer direction for them to speak to [facility] about pain medications and they are managing BP medications. (see his note)

## 2015-10-19 DIAGNOSIS — M4806 Spinal stenosis, lumbar region: Secondary | ICD-10-CM | POA: Diagnosis not present

## 2015-10-19 DIAGNOSIS — M5136 Other intervertebral disc degeneration, lumbar region: Secondary | ICD-10-CM | POA: Diagnosis not present

## 2015-10-21 NOTE — Progress Notes (Signed)
Internal Medicine Clinic Attending  Case discussed with Dr. Emokpae soon after the resident saw the patient.  We reviewed the resident's history and exam and pertinent patient test results.  I agree with the assessment, diagnosis, and plan of care documented in the resident's note. 

## 2015-10-23 ENCOUNTER — Telehealth: Payer: Self-pay | Admitting: Internal Medicine

## 2015-10-23 NOTE — Telephone Encounter (Signed)
APPT REMINDER CALL, LMTCB IF SHE NEEDS TO CANCEL °

## 2015-10-24 ENCOUNTER — Encounter: Payer: Self-pay | Admitting: Internal Medicine

## 2015-10-24 ENCOUNTER — Ambulatory Visit (INDEPENDENT_AMBULATORY_CARE_PROVIDER_SITE_OTHER): Payer: Medicare Other | Admitting: Internal Medicine

## 2015-10-24 ENCOUNTER — Ambulatory Visit (INDEPENDENT_AMBULATORY_CARE_PROVIDER_SITE_OTHER): Payer: Medicare Other | Admitting: Pharmacist

## 2015-10-24 ENCOUNTER — Encounter: Payer: Self-pay | Admitting: Pharmacist

## 2015-10-24 VITALS — BP 164/83 | HR 86 | Temp 97.7°F | Wt 154.0 lb

## 2015-10-24 DIAGNOSIS — Z7189 Other specified counseling: Secondary | ICD-10-CM

## 2015-10-24 DIAGNOSIS — I10 Essential (primary) hypertension: Secondary | ICD-10-CM

## 2015-10-24 DIAGNOSIS — Z79899 Other long term (current) drug therapy: Secondary | ICD-10-CM

## 2015-10-24 MED ORDER — BENAZEPRIL HCL 40 MG PO TABS: 40.0000 mg | ORAL_TABLET | Freq: Every day | ORAL | Status: DC

## 2015-10-24 MED ORDER — AMLODIPINE BESYLATE 10 MG PO TABS: 10.0000 mg | ORAL_TABLET | Freq: Every day | ORAL | Status: DC

## 2015-10-24 NOTE — Patient Instructions (Signed)
-  Start retaking HCTZ 12.5 mg daily and check your blood pressure at home for the next few days if they are >140/90 then restart norvasc 10 mg daily  -Please come back in 1-2 weeks to recheck your blood pressure -Very nice meeting you!  General Instructions:   Thank you for bringing your medicines today. This helps [place] keep you safe from mistakes.   Progress Toward Treatment Goals:  Treatment Goal 01/03/2014  Blood pressure at goal  Stop smoking smoking the same amount    Self Care Goals & Plans:  Self Care Goal 09/06/2015  Manage my medications take my medicines as prescribed; bring my medications to every visit; refill my medications on time; follow the sick day instructions if I am sick  Monitor my health keep track of my blood pressure  Eat healthy foods eat more vegetables; eat fruit for snacks and desserts; eat baked foods instead of fried foods; eat smaller portions  Be physically active find an activity I enjoy    No flowsheet data found.   Care Management & Community Referrals:  Referral 01/03/2014  Referrals made for care management support none needed

## 2015-10-24 NOTE — Progress Notes (Signed)
Pharmacist-physician co-visit Patient reports with questions regarding pain medications so reviewed with the patient, including name, instructions, indication, goals of therapy, potential side effects, importance of adherence, and safe use.  Found that patient has been taking benazapril 40 mg daily but not HCTZ or amlodipine. Collaborating with PCP for further therapy changes.  Patient verbalized understanding by repeating back information and was advised to contact me if further medication-related questions arise. Patient was also provided an information handout.

## 2015-10-24 NOTE — Assessment & Plan Note (Signed)
Assessment: Pt with well-controlled hypertension on three-class anti-hypertensive therapy (diuretic, ACEi, and CCB) with recent symptomatic hypotension most likely due to polypharmacy who presents with blood pressure of 164/83.   Plan:  -BP 164/83 not at goal <140/90 -Pt instructed to restart HCTZ 12.5 mg daily and continue benazepril 40 mg daily -Pt instructed to restart amlodipine 10 mg daily if home BP >140/90  -Last BMP on 08/04/15 with hypokalemia, repeat next visit in 1 week

## 2015-10-24 NOTE — Progress Notes (Signed)
Patient ID: Jill Phillips, female   DOB: 06-12-1956, 60 y.o.   MRN: [id number]    Subjective:   Patient ID: Jill Phillips female   DOB: 1956-07-09 60 y.o.   MRN: [id number]  HPI: [name] is a 60 y.o. pleasant woman with past medical history of hypertension, hypothyroidism, depression, lumbar DDD, chronic normocytic anemia, OA, RLS, GERD, and tobacco use who presents with chief complaint of recent low blood pressures.   She is normally on benazepril 40 mg daily, amlodipine 10 mg daily, and HCTZ 12.5 mg daily. She was noted to be hypotensive at home in the systolic [id number] during the day and 50-70's at night with symptoms of lightheadedness. She was seen on 2/10 and her blood pressure was elevated at 161/92 and her HCTZ 12.5 mg daily was restarted. On 2/15 she was instructed to restart benazepril 40 mg daily and was noted to not be taking amlodipine or HCTZ. She is on multiple pain medications and last week her fentanyl patch was decreased in dosage from 75 to 50. She has headache and chronic LE edema but denies blurry vision, chest pain, or lightheadedness.     Past Medical History  Diagnosis Date  . Postlaminectomy syndrome, thoracic region   . Osteoarthrosis, unspecified whether generalized or localized, lower leg   . Dysthymic disorder   . Calcifying tendinitis of shoulder   . Pain in joint, upper arm   . Chronic pain syndrome   . Lumbago   . Primary localized osteoarthrosis, lower leg   . Hypertension   . Hyperlipidemia   . GERD (gastroesophageal reflux disease)   . Thyroid disease   . Restless leg syndrome   . History of blood transfusion 1980  . Hypothyroidism   . Active smoker   . COPD (chronic obstructive pulmonary disease) ([place])     on nocturnal home o2  . Heart murmur     for years, nothing to be concerned about  . Sleep apnea     s/p surgery- last sleep study 2011- doesnt use oxygen or machine at night as instructed,.   12/2014- Dr Halford Chessman  reports it is negative.  [name]  Anxiety   . Herpes genitalia   . PONV (postoperative nausea and vomiting)     gets nauseous  with longer surgery. Difficuty voiding after surgery   Current Outpatient Prescriptions  Medication Sig Dispense Refill  . acetaminophen (TYLENOL) 500 MG tablet Take 1,000 mg by mouth every 6 (six) hours as needed for headache.     . albuterol (PROVENTIL HFA;VENTOLIN HFA) 108 (90 BASE) MCG/ACT inhaler Inhale 1 puff into the lungs every 6 (six) hours as needed for wheezing or shortness of breath. 1 Inhaler 5  . carisoprodol (SOMA) 350 MG tablet Take 1 tablet (350 mg total) by mouth 3 (three) times daily. (Patient not taking: Reported on 10/24/2015) 90 tablet 0  . clonazePAM (KLONOPIN) 2 MG tablet TAKE 1 TABLET BY MOUTH TWICE DAILY AS NEEDED 60 tablet 0  . fentaNYL (DURAGESIC - DOSED MCG/HR) 75 MCG/HR Place 1 patch (75 mcg total) onto the skin every 3 (three) days. 8 patch 0  . ferrous gluconate (FERGON) 324 MG tablet Take 1 tablet (324 mg total) by mouth 3 (three) times daily with meals. 90 tablet 6  . gabapentin (NEURONTIN) 300 MG capsule TAKE 1 CAPSULE BY MOUTH 2 TIMES DAILY. (Patient not taking: Reported on 10/24/2015) 60 capsule 3  . guaiFENesin-dextromethorphan (ROBITUSSIN DM) 100-10 MG/5ML syrup Take 5 mLs by mouth every  4 (four) hours as needed for cough. (Patient not taking: Reported on 10/24/2015) 118 mL 0  . hydrochlorothiazide (MICROZIDE) 12.5 MG capsule Take 1 capsule (12.5 mg total) by mouth daily. (Patient not taking: Reported on 10/24/2015)    . HYDROcodone-acetaminophen (NORCO) 10-325 MG tablet Take 1 tablet by mouth every 8 (eight) hours as needed for severe pain. (Patient not taking: Reported on 10/24/2015) 90 tablet 0  . HYDROcodone-acetaminophen (NORCO) 10-325 MG tablet Take 1-2 tablets by mouth every 4 (four) hours as needed (breakthrough pain). 90 tablet 0  . levothyroxine (SYNTHROID, LEVOTHROID) 75 MCG tablet Take 1 tablet (75 mcg total) by mouth daily before breakfast. 30 tablet 11  .  meclizine (ANTIVERT) 25 MG tablet Take 25 mg by mouth 2 (two) times daily as needed for nausea. Reported on 10/24/2015    . naloxone (NARCAN) 0.4 MG/ML injection Inject 1 mL (0.4 mg total) into the vein as needed. 1 mL 3  . omeprazole (PRILOSEC) 20 MG capsule TAKE 1 CAPSULE BY MOUTH 2 TIMES DAILY BEFORE A MEAL. 60 capsule 11  . pravastatin (PRAVACHOL) 40 MG tablet TAKE 1 TABLET BY MOUTH DAILY. (Patient taking differently: TAKE 1 TABLET BY MOUTH DAILY AT BEDTIME) 30 tablet 11  . primidone (MYSOLINE) 50 MG tablet Take 1 tablet (50 mg total) by mouth 2 (two) times daily. 1 tab nightly for 3 days, then 1 tab twice daily  thereafter 60 tablet 2  . Psyllium (VEGETABLE LAXATIVE PO) Take 2 tablets by mouth at bedtime. Reported on 10/24/2015    . QUEtiapine (SEROQUEL) 50 MG tablet Take 100 mg by mouth at bedtime.     . senna-docusate (SENOKOT-S) 8.6-50 MG per tablet Take 2 tablets by mouth at bedtime as needed for mild constipation. (Patient not taking: Reported on 10/24/2015) 60 tablet 0  . SPIRIVA HANDIHALER 18 MCG inhalation capsule PLACE 1 CAPSULE INTO INHALER AND INHALE DAILY. 30 capsule 1  . valACYclovir (VALTREX) 500 MG tablet TAKE 1 TABLET BY MOUTH DAILY. 30 tablet 11  . venlafaxine XR (EFFEXOR-XR) 150 MG 24 hr capsule TAKE 2 CAPSULES (300 MG TOTAL) BY MOUTH DAILY WITH BREAKFAST. (Patient taking differently: TAKE 1 CAPSULE (150 MG) BY MOUTH TWICE DAILY -BREAKFAST AND SUPPER) 60 capsule 11  . VOLTAREN 1 % GEL APPLY 2 GRAMS TO AFFECTED AREA 3 TIMES A DAY AS NEEDED FOR PAIN. (Patient taking differently: APPLY  TO AFFECTED AREA 3 TIMES A DAY AS NEEDED FOR PAIN.) 300 g 2   No current facility-administered medications for this visit.   Family History  Problem Relation Age of Onset  . Kidney disease Mother   . Heart disease Father   . Anuerysm Brother 29    brain  . Heart disease Brother   . Heart disease Sister 76    s/p CABG  . Hypertension Sister    Social History   Social History  . Marital  Status: Divorced    Spouse Name: n/a  . Number of Children: 2  . Years of Education: 12+   Occupational History  . disability     back surgeries   Social History Main Topics  . Smoking status: Current Every Day Smoker -- 1.25 packs/day for 45 years    Types: Cigarettes  . Smokeless tobacco: Never Used     Comment: trying to quit  . Alcohol Use: No  . Drug Use: No  . Sexual Activity: Not Asked   Other Topics Concern  . None   Social History Narrative   Lives  alone.  One daughter is local, but is getting ready to move to [place], where her children live with their father.  The other daughter lives near [place], [place].   Review of Systems: Review of Systems  Constitutional: Negative for fever and chills.  Eyes: Negative for blurred vision.  Respiratory: Positive for cough and wheezing.   Cardiovascular: Positive for leg swelling. Negative for chest pain.  Gastrointestinal: Negative for nausea, vomiting, abdominal pain, diarrhea and constipation.  Genitourinary: Negative for dysuria, urgency and frequency.  Musculoskeletal: Positive for back pain (chronic).  Neurological: Positive for headaches. Negative for dizziness.  Endo/Heme/Allergies: Positive for environmental allergies.     .ros Objective:  Physical Exam: Filed Vitals:   10/24/15 1455  BP: 164/83  Pulse: 86  Temp: 97.7 F (36.5 C)  TempSrc: Oral  Weight: 154 lb (69.854 kg)  SpO2: 97%    Physical Exam  Constitutional: She is oriented to person, place, and time. She appears well-developed and well-nourished. No distress.  Strong cigarette smoke odor  HENT:  Head: Normocephalic and atraumatic.  Right Ear: External ear normal.  Left Ear: External ear normal.  Nose: Nose normal.  Mouth/Throat: Oropharynx is clear and moist. No oropharyngeal exudate.  Eyes: Conjunctivae and EOM are normal. Pupils are equal, round, and reactive to light. Right eye exhibits no discharge. Left eye exhibits no discharge. No  scleral icterus.  Neck: Normal range of motion. Neck supple.  Cardiovascular: Normal rate and regular rhythm.   Pulmonary/Chest: Effort normal and breath sounds normal. No respiratory distress. She has no wheezes. She has no rales.  Abdominal: Soft. Bowel sounds are normal. She exhibits no distension. There is no tenderness. There is no rebound and no guarding.  Musculoskeletal: Normal range of motion. She exhibits no edema or tenderness.  Neurological: She is alert and oriented to person, place, and time.  Skin: Skin is warm and dry. No rash noted. She is not diaphoretic. No erythema. No pallor.  Psychiatric: She has a normal mood and affect. Her behavior is normal. Judgment and thought content normal.    Assessment & Plan:   Please see problem list for problem-based assessment and plan

## 2015-10-24 NOTE — Patient Instructions (Signed)
Taking hydrocodone/acetaminophen (Lorcet, Lortab, Norco, Vicodin) puts you at risk for:  . Poor memory . Confusion . Hangover . Slow thinking  . Low energy . Poor balance . Falls . Broken bones . Car crashes . Medicine cravings   Talk to your doctor before stopping the hydrocodone/acetaminophen (Lorcet, Lortab, Norco, Vicodin). Your doctor can work with you to find the best treatment. Your doctor may try other medicines which are safer.  Here are some ideas for non-medicine pain therapies.  . Heat: Heat helps with muscle pain. Carefully apply heat to the area for 20 to 30 minutes as directed. Alvera Singh: Ice helps with swelling pain. Use an ice pack or put ice in a plastic bag. Cover it with a towel and place it on the area for 15 to 20 minutes as directed. . Massage: This may help with pain from tight muscles. . Lower stress: This can also help with pain. Exercise, deep breathing, meditation, aromatherapy, and music therapy are some ideas to help with stress.   Taking clonazepam for a long time puts you at risk for: . Poor memory . Confusion . Hangover . Slow thinking  . Low energy . Poor balance . Falls . Broken bones . Car crashes . Medicine cravings    Do not stop taking the clonazepam suddenly. Talk to your doctor first. Your doctor can work with you to find the best treatment. Your doctor may try other medicines which are safer.  Here are some non-medicines you can try.  For sleep:  . Avoid naps during the day so you can rest well at night.  . Avoid caffeine, nicotine, and alcohol close to bedtime.  [name] Avoid large meals and strong foods like spicy dishes close to bedtime.  . Associate your bed with sleep. Avoid TV and computer right before bedtime. . Get about 10-20 minutes of sunlight each day.  . Exercise during the day. . Before bedtime, do something relaxing. Calm your thinking. . Sleep in a comfortable and relaxing area. Ideas include white noise, sound machine, and  calm music.  For anxiety:   Avoid caffeine, nicotine and alcohol. These can cause more anxiety.  Treat yourself with some quiet "me" time for at least 15 minutes each day.  Do more things that you enjoy to lower stress. Here are some ideas: o Reading o Exercise o Deep breathing o Music o Yoga o Spending time with loved ones o Caring for a pet o Meditation o Massage therapy o Aromatherapy

## 2015-10-24 NOTE — Progress Notes (Signed)
Patient reports with questions regarding pain medications so reviewed with the patient, including name, instructions, indication, goals of therapy, potential side effects, importance of adherence, and safe use.   Found that patient has been taking benazapril 40 mg daily but not HCTZ or amlodipine. Collaborating with PCP for further therapy changes.   Patient verbalized understanding by repeating back information and was advised to contact me if further medication-related questions arise. Patient was also provided an information handout.

## 2015-10-26 ENCOUNTER — Other Ambulatory Visit: Payer: Self-pay | Admitting: Internal Medicine

## 2015-10-26 NOTE — Progress Notes (Signed)
Internal Medicine Clinic Attending  Case discussed with Dr. Rabbani at the time of the visit.  We reviewed the resident's history and exam and pertinent patient test results.  I agree with the assessment, diagnosis, and plan of care documented in the resident's note.  

## 2015-10-30 DIAGNOSIS — M461 Sacroiliitis, not elsewhere classified: Secondary | ICD-10-CM | POA: Diagnosis not present

## 2015-10-30 DIAGNOSIS — M451 Ankylosing spondylitis of occipito-atlanto-axial region: Secondary | ICD-10-CM | POA: Diagnosis not present

## 2015-10-31 ENCOUNTER — Other Ambulatory Visit: Payer: Self-pay | Admitting: Physical Medicine & Rehabilitation

## 2015-11-01 ENCOUNTER — Other Ambulatory Visit: Payer: Self-pay | Admitting: *Deleted

## 2015-11-01 MED ORDER — CLONAZEPAM 2 MG PO TABS: 2.0000 mg | ORAL_TABLET | Freq: Two times a day (BID) | ORAL | Status: DC | PRN

## 2015-11-01 NOTE — Telephone Encounter (Signed)
Unusual request for medication sent electronically...please advise

## 2015-11-03 ENCOUNTER — Telehealth: Payer: Self-pay | Admitting: *Deleted

## 2015-11-03 NOTE — Telephone Encounter (Signed)
Dr [name] raised her fentanyl to 70mcg but she was unable to tolerate it because it increased her BP, so she is on 36mcg. (she had all tos high and low BP issues and her primary was trying to deal with all her meds she is taking --see previous phone call )  She says her legs are killing her and the primidone is not working.  She is wondering what she can do. ( she has appt with Naaman Plummer 11/29/15)

## 2015-11-04 ENCOUNTER — Other Ambulatory Visit: Payer: Self-pay | Admitting: Physical Medicine & Rehabilitation

## 2015-11-04 ENCOUNTER — Telehealth: Payer: Self-pay | Admitting: Pulmonary Disease

## 2015-11-04 NOTE — Telephone Encounter (Signed)
   Reason for call:   I received a call from [name] at [facility] PM indicating she is having dizziness and feeling off balance.   Pertinent Data:   She has been feeling off balance, stumbling. Afraid she is going to fall. Lives alone. Started yesterday. Spinning sensation. Occurs with standing up. Has had some episodes at rest.  Has been trying to get over "chest cold" and sinus problems - 2 months. Improving. No sick contacts. No fevers or chills.   No chest pain or palpitations. No SOB. No nausea or vomiting. No abdominal pain. No diarrhea. No dysuria. Eating and drinking normally. Has been drinking Gatorade.  Has been taking blood pressure medications: amlodipine, benazepril and HCTZ. BP was 124/75 at last check.  Takes Klonopin twice a day. Has fentanyl patch. Has not taking any Norco today. Took 1/2 tablet of Norco yesterday. No changes in this regimen recently.   Assessment / Plan / Recommendations:   Differential includes polypharmacy, orthostatic hypotension  She has already taken HCTZ and benzepril. Advised her to cut her amlodipine dose in half tonight.  She may continue to take dimenhydrinate OTC as needed for dizziness.  Will forward this message to front desk to offer her an appointment early next week.  As always, pt is advised that if symptoms worsen or new symptoms arise, they should go to an urgent care facility or to to ER for further evaluation.   Milagros Loll, MD   11/04/2015, 3:43 PM

## 2015-11-06 MED ORDER — ROPINIROLE HCL 0.5 MG PO TABS: 1.0000 mg | ORAL_TABLET | Freq: Every day | ORAL | Status: DC

## 2015-11-06 MED ORDER — ROPINIROLE HCL 0.5 MG PO TABS: 0.5000 mg | ORAL_TABLET | Freq: Every day | ORAL | Status: DC

## 2015-11-06 NOTE — Telephone Encounter (Signed)
Sayana notified that med sent to pharmacy and to stop the primidone.

## 2015-11-06 NOTE — Telephone Encounter (Signed)
i thought i had used requip for her before, but i don't see it anywhere in Epic. If not, we could begin at 0.5mg  at night for one week then increase to 1mg  qhs. If she starts this, then i would stop the primidone.

## 2015-11-07 ENCOUNTER — Other Ambulatory Visit: Payer: Self-pay | Admitting: Internal Medicine

## 2015-11-07 ENCOUNTER — Telehealth: Payer: Self-pay | Admitting: *Deleted

## 2015-11-07 MED ORDER — MECLIZINE HCL 25 MG PO TABS: 25.0000 mg | ORAL_TABLET | Freq: Two times a day (BID) | ORAL | Status: DC | PRN

## 2015-11-07 NOTE — Telephone Encounter (Signed)
Pt ask for meclizine to be called to pharm, she states she is too busy too come for an appt at this time, she is advised to look at her schedule and call for an appt when her schedule allows

## 2015-11-10 NOTE — Telephone Encounter (Signed)
When I spoke to her she was getting relief with OTC dramamine. Would not order meclizine for her due to polypharmacy concerns. Needs appointment if she needs meclizine.  Jacques Earthly, MD  Internal Medicine Teaching Service PGY-2

## 2015-11-15 ENCOUNTER — Ambulatory Visit (INDEPENDENT_AMBULATORY_CARE_PROVIDER_SITE_OTHER): Payer: Medicare Other | Admitting: Internal Medicine

## 2015-11-15 ENCOUNTER — Encounter: Payer: Self-pay | Admitting: Internal Medicine

## 2015-11-15 VITALS — BP 137/84 | HR 94 | Temp 98.1°F | Resp 18 | Ht 64.0 in | Wt 150.8 lb

## 2015-11-15 DIAGNOSIS — R0781 Pleurodynia: Secondary | ICD-10-CM | POA: Insufficient documentation

## 2015-11-15 DIAGNOSIS — I10 Essential (primary) hypertension: Secondary | ICD-10-CM | POA: Diagnosis not present

## 2015-11-15 DIAGNOSIS — G4734 Idiopathic sleep related nonobstructive alveolar hypoventilation: Secondary | ICD-10-CM | POA: Diagnosis not present

## 2015-11-15 NOTE — Assessment & Plan Note (Signed)
Pain likely 2/2 to MSK etiology. Could be caused by her reaching up to put a towel on a shelf 2 weeks ago. No abnormalities noted on exam.  Asked her to use her voltaren gel on this area.  If pain does not get better may consider Xray of her Tspine to look for hardware malalignment as she has had several back surgeries. Pain is not radicular in nature from her back and she has no tenderness on her back so that's less likely. F/up PRN.

## 2015-11-15 NOTE — Progress Notes (Signed)
   Subjective:    Patient ID: Jill Phillips, female    DOB: [birth year], 60 y.o.   MRN: [id number]  Abdominal Pain Pertinent negatives include no diarrhea, dysuria, fever, headaches, hematuria, nausea or vomiting.    18 female with hx of HTN, hyporhyroidism, DJD of lumbar spine, chronic back pain, fusion of T11-12, L3-4, and L4 to sacrum in the past,, OA of b/l knees, HLD, depression, sleep apnea, RLS, here for   Was last seen for having low BP on her BP meds. Was on benazepril 40mg  daily, amlodipne 10mg  daily and hctz 12.5mg  daily. Was getting dizzy and lightheaded as well. 1/4 hctz was stopped. 2/10 HCTZ was restarted but patient was not taking it. Last visit from 10/24/15 she was told to restart HCTZ 12.5mg  daily along with continuing Benazepril 40mg  daily. Was also asked to restart amlodipine 10mg  daily if BP >140/90.    She has been taking all 3 of these meds. Around this time her fentanyl dose was reduced down which likely was causing her symptoms of dizziness and hypotensive episodes. Today her BP is great on amlodipine, benazepril, and hctz.   Also has 2 weeks of left sided rib pain. She remembers reaching up to put a towel on a shelf, pain started after that event but not sure if that's related. No change of pain with activities. Pain is 2-3/10 at max. No rash. No change with eating. No recent injuries. Pain does not radiate anywhere.     Review of Systems  Constitutional: Negative for fever, chills and fatigue.  HENT: Negative for congestion and sore throat.   Respiratory: Negative for cough, shortness of breath and wheezing.   Cardiovascular: Negative for chest pain, palpitations and leg swelling.  Gastrointestinal: Negative for nausea, vomiting, abdominal pain, diarrhea and abdominal distention.  Endocrine: Negative.   Genitourinary: Negative for dysuria and hematuria.  Musculoskeletal:       Has left sided rib pain Has chronic back pain and knee pain  Neurological: Negative  for dizziness and headaches.       Objective:   Physical Exam  Constitutional: She is oriented to person, place, and time. She appears well-developed and well-nourished. No distress.  HENT:  Head: Normocephalic and atraumatic.  Eyes: Conjunctivae are normal. Pupils are equal, round, and reactive to light.  Neck: Normal range of motion.  Cardiovascular: Normal rate and regular rhythm.  Exam reveals no gallop and no friction rub.   No murmur heard. Pulmonary/Chest: She has no wheezes. She exhibits no tenderness.  Abdominal: Soft. Bowel sounds are normal. She exhibits no distension. There is no tenderness.  Musculoskeletal: Normal range of motion. She exhibits no edema or tenderness.  Has no tenderness to palpation over her left rib area. No visible rash or skin lesions. No pain with shoulder movement.  Neurological: She is alert and oriented to person, place, and time. No cranial nerve deficit.  Skin: Skin is warm. She is not diaphoretic.  Psychiatric: She has a normal mood and affect.    Filed Vitals:   11/15/15 1432  BP: 137/84  Pulse: 94  Temp: 98.1 F (36.7 C)  Resp: 18         Assessment & Plan:  See problem based a&p.

## 2015-11-15 NOTE — Patient Instructions (Signed)
Try voltaren gel for your rib pain. If it does not get better please follow up with [place].   Keep taking your current BP meds.

## 2015-11-15 NOTE — Assessment & Plan Note (Signed)
Filed Vitals:   11/15/15 1432  BP: 137/84  Pulse: 94  Temp: 98.1 F (36.7 C)  Resp: 18   BP is now well controlled on amlodipine 10mg  + benazepril 40mg  + hctz 12.5mg  daily. Continue this. Her low BP was likely 2/2 to increased dose of fentanyl patch 46mcg which has been reduced to 57mcg now and she is no longer dizzy or hypotensive.

## 2015-11-16 ENCOUNTER — Telehealth: Payer: Self-pay

## 2015-11-16 NOTE — Telephone Encounter (Signed)
Pt was requesting a refill on her Fentanyl patch. I asked if the pt was still under Dr. Otho Ket care and she stated that she has an appt to see him on 11/22/15. I informed the pt to go Dr. [name] for the refill.

## 2015-11-17 ENCOUNTER — Other Ambulatory Visit: Payer: Self-pay | Admitting: Internal Medicine

## 2015-11-17 DIAGNOSIS — J309 Allergic rhinitis, unspecified: Secondary | ICD-10-CM

## 2015-11-17 NOTE — Telephone Encounter (Signed)
Pt requesting flonase to be called to the pharmacy.

## 2015-11-17 NOTE — Progress Notes (Signed)
Internal Medicine Clinic Attending  Case discussed with Dr. Ahmed soon after the resident saw the patient.  We reviewed the resident's history and exam and pertinent patient test results.  I agree with the assessment, diagnosis, and plan of care documented in the resident's note. 

## 2015-11-20 ENCOUNTER — Telehealth: Payer: Self-pay | Admitting: *Deleted

## 2015-11-20 ENCOUNTER — Telehealth: Payer: Self-pay | Admitting: Pulmonary Disease

## 2015-11-20 NOTE — Telephone Encounter (Signed)
Mandy returning call.

## 2015-11-20 NOTE — Telephone Encounter (Signed)
Spoke with Leafy Ro at [facility], states pt is claiming that VS told her she does not need her 02 anymore.  I advised that pt has not been seen since April 2016, and that it looks like Chara from [facility] is already in correspondence with pt's internal medicine doc regarding this.  Leafy Ro states she will follow up with internal medicine regarding this, and will call back if anything further is needed. Will close message.

## 2015-11-20 NOTE — Telephone Encounter (Signed)
Previous Inbasket messages recorded in pt's chart.  Pt also contacting her pulmonology office regarding home oxygen.Despina Hidden Cassady3/20/201711:51 AM

## 2015-11-20 NOTE — Telephone Encounter (Signed)
-----   Message from Juluis Mire, MD sent at 11/14/2015  4:05 PM EDT ----- Regarding: RE: ONO off O2 Hi,   She's is actually Dr. Eilleen Kempf patient. I just saw her once a few weeks ago. I have cc'd her on this note, thanks!  Dr. Naaman Plummer  ----- Message -----    From: Deno Lunger    Sent: 11/14/2015   2:54 PM      To: Marcelino Duster, CMA, Juluis Mire, MD Subject: ONO off O2                                     Good afternoon - this is Rodena Piety from [facility].  We received a phone call today from Pt wanting her Oxygen picked up. We advised the Pt we needed to f/u with you first and see if you would like for [place] to do an Overnight Oximetry prior to pickup to see if the Pt is still having nocturnal desaturations.   If you would like for [place] to do an overnight please put order in epic and send staff message back and we will take care of it.  Thanks  Darnelle Spangle [facility]

## 2015-11-20 NOTE — Telephone Encounter (Signed)
lmtcb X1 for Mandy with [employer]

## 2015-11-22 MED ORDER — FLUTICASONE PROPIONATE 50 MCG/ACT NA SUSP: 1.0000 | Freq: Every day | NASAL | Status: DC

## 2015-11-24 DIAGNOSIS — M461 Sacroiliitis, not elsewhere classified: Secondary | ICD-10-CM | POA: Diagnosis not present

## 2015-11-24 DIAGNOSIS — M451 Ankylosing spondylitis of occipito-atlanto-axial region: Secondary | ICD-10-CM | POA: Diagnosis not present

## 2015-11-24 DIAGNOSIS — M1712 Unilateral primary osteoarthritis, left knee: Secondary | ICD-10-CM | POA: Diagnosis not present

## 2015-11-24 DIAGNOSIS — M4806 Spinal stenosis, lumbar region: Secondary | ICD-10-CM | POA: Diagnosis not present

## 2015-11-28 ENCOUNTER — Telehealth: Payer: Self-pay | Admitting: *Deleted

## 2015-11-28 NOTE — Telephone Encounter (Signed)
Jill Phillips is wanting to know about getting a klonopin refill.  We have not seen her since November.  She has an appt 12/12/15 with you.  Can we refill this?  I see that Jill Phillips refilled it on 11/01/15  And Jill Phillips before that in January.  With all the concerns over her being overmedicated from the primary (see call documented 11/04/15 concerning dizziness)  I did not want to refill this without your approval.

## 2015-11-29 ENCOUNTER — Ambulatory Visit: Payer: Medicare Other | Admitting: Physical Medicine & Rehabilitation

## 2015-11-29 MED ORDER — CLONAZEPAM 2 MG PO TABS: 2.0000 mg | ORAL_TABLET | Freq: Two times a day (BID) | ORAL | Status: DC | PRN

## 2015-11-29 NOTE — Telephone Encounter (Signed)
May refill x1 

## 2015-11-29 NOTE — Telephone Encounter (Signed)
Called to pharmacy and Rodena Piety notified

## 2015-12-01 ENCOUNTER — Telehealth: Payer: Self-pay | Admitting: *Deleted

## 2015-12-01 NOTE — Telephone Encounter (Signed)
Jill Phillips is calling back now complaining about her restless legs and the requip is not helping. She has appt 12/12/15.  Please advise.

## 2015-12-04 NOTE — Telephone Encounter (Signed)
i don't have anything new to recommend

## 2015-12-04 NOTE — Telephone Encounter (Signed)
She can discuss at next appt

## 2015-12-05 ENCOUNTER — Emergency Department (HOSPITAL_COMMUNITY)
Admission: EM | Admit: 2015-12-05 | Discharge: 2015-12-06 | Disposition: A | Discharge status: Home / Self Care | Payer: Medicare Other | Attending: Emergency Medicine | Admitting: Emergency Medicine

## 2015-12-05 DIAGNOSIS — R091 Pleurisy: Secondary | ICD-10-CM | POA: Diagnosis not present

## 2015-12-05 DIAGNOSIS — I10 Essential (primary) hypertension: Secondary | ICD-10-CM | POA: Insufficient documentation

## 2015-12-05 DIAGNOSIS — G2581 Restless legs syndrome: Secondary | ICD-10-CM | POA: Insufficient documentation

## 2015-12-05 DIAGNOSIS — Z8619 Personal history of other infectious and parasitic diseases: Secondary | ICD-10-CM | POA: Insufficient documentation

## 2015-12-05 DIAGNOSIS — R011 Cardiac murmur, unspecified: Secondary | ICD-10-CM | POA: Diagnosis not present

## 2015-12-05 DIAGNOSIS — Z79899 Other long term (current) drug therapy: Secondary | ICD-10-CM | POA: Insufficient documentation

## 2015-12-05 DIAGNOSIS — F419 Anxiety disorder, unspecified: Secondary | ICD-10-CM | POA: Insufficient documentation

## 2015-12-05 DIAGNOSIS — G894 Chronic pain syndrome: Secondary | ICD-10-CM | POA: Insufficient documentation

## 2015-12-05 DIAGNOSIS — F17210 Nicotine dependence, cigarettes, uncomplicated: Secondary | ICD-10-CM | POA: Diagnosis not present

## 2015-12-05 DIAGNOSIS — J449 Chronic obstructive pulmonary disease, unspecified: Secondary | ICD-10-CM | POA: Diagnosis not present

## 2015-12-05 DIAGNOSIS — Z880 Allergy status to penicillin: Secondary | ICD-10-CM | POA: Insufficient documentation

## 2015-12-05 DIAGNOSIS — K219 Gastro-esophageal reflux disease without esophagitis: Secondary | ICD-10-CM | POA: Diagnosis not present

## 2015-12-05 DIAGNOSIS — M545 Low back pain: Secondary | ICD-10-CM | POA: Diagnosis not present

## 2015-12-05 DIAGNOSIS — E039 Hypothyroidism, unspecified: Secondary | ICD-10-CM | POA: Diagnosis not present

## 2015-12-05 DIAGNOSIS — R079 Chest pain, unspecified: Secondary | ICD-10-CM | POA: Diagnosis not present

## 2015-12-05 DIAGNOSIS — R52 Pain, unspecified: Secondary | ICD-10-CM | POA: Diagnosis not present

## 2015-12-05 NOTE — ED Notes (Signed)
According to EMS, pt c/o of Left Rib Pain that radiates to her mid-back. Pt has hx of Back Surgery on 08/01/15. Pt states she took her Rx'd pain meds today. Pt arrives A+OX4, denies any other c/o.

## 2015-12-05 NOTE — ED Notes (Signed)
Bed: [id number] Expected date:  Expected time:  Means of arrival:  Comments: EMS 59yo F rib pain radiating to back x 3 months worse today

## 2015-12-06 ENCOUNTER — Emergency Department (HOSPITAL_COMMUNITY): Payer: Medicare Other

## 2015-12-06 ENCOUNTER — Telehealth: Payer: Self-pay | Admitting: Internal Medicine

## 2015-12-06 DIAGNOSIS — R091 Pleurisy: Secondary | ICD-10-CM | POA: Diagnosis not present

## 2015-12-06 DIAGNOSIS — R079 Chest pain, unspecified: Secondary | ICD-10-CM | POA: Diagnosis not present

## 2015-12-06 NOTE — ED Provider Notes (Signed)
CSN: [id number]     Arrival date & time 12/05/15  2345 History   First MD Initiated Contact with Patient 12/06/15 0103     Chief Complaint  Patient presents with  . Pleurisy    Left Rib Pain  . Back Pain  . Pain     (Consider location/radiation/quality/duration/timing/severity/associated sxs/prior Treatment) HPI Comments: This is a 60 year old female who is in the emergency department tonight via EMS with continuing left rib pain radiates to her mid back is been going on for greater than 3 months.  She does have a history of back surgery in November 2016.  She also was seen by her primary care physician approximately 2 weeks found diagnosed with pleurisy.  She is a smoker.  She denies continue persistent cough.  Denies any trauma.  States she has not fallen or stretched Tonight she exacerbated her discomfort when she tried to close the blinds on the window.  She denies any shortness of breath.  She does still smoke.  Patient is a 60 y.o. female presenting with back pain. The history is provided by the patient.  Back Pain Location:  Lumbar spine Quality:  Aching Radiates to:  Does not radiate Pain severity:  Moderate Pain is:  Same all the time Duration:  3 months Timing:  Intermittent Progression:  Unchanged Chronicity:  Chronic Context: twisting   Relieved by:  Nothing Worsened by:  Nothing tried Ineffective treatments:  None tried Associated symptoms: chest pain   Associated symptoms: no fever     Past Medical History  Diagnosis Date  . Postlaminectomy syndrome, thoracic region   . Osteoarthrosis, unspecified whether generalized or localized, lower leg   . Dysthymic disorder   . Calcifying tendinitis of shoulder   . Pain in joint, upper arm   . Chronic pain syndrome   . Lumbago   . Primary localized osteoarthrosis, lower leg   . Hypertension   . Hyperlipidemia   . GERD (gastroesophageal reflux disease)   . Thyroid disease   . Restless leg syndrome   . History of  blood transfusion 1980  . Hypothyroidism   . Active smoker   . COPD (chronic obstructive pulmonary disease) ([place])     on nocturnal home o2  . Heart murmur     for years, nothing to be concerned about  . Sleep apnea     s/p surgery- last sleep study 2011- doesnt use oxygen or machine at night as instructed,.   12/2014- Dr Halford Chessman  reports it is negative.  [name] Anxiety   . Herpes genitalia   . PONV (postoperative nausea and vomiting)     gets nauseous  with longer surgery. Difficuty voiding after surgery   Past Surgical History  Procedure Laterality Date  . Appendectomy    . Abdominal hysterectomy    . Tubal ligation    . Spine surgery      thoracic x 1,  lumbar x 15  . Right hip replacement    . Knee surgeries r knee      arthroscopy- right  . Hammer toe surgery    . Sleep apnea surgery    . Cholecystectomy    . Total knee arthroplasty  05/29/2012    Procedure: TOTAL KNEE ARTHROPLASTY;  Surgeon: Mcarthur Rossetti, MD;  Location: [facility];  Service: Orthopedics;  Laterality: Right;  Right Total Knee Arthroplasty  . Lumbar laminectomy/decompression microdiscectomy N/A 01/28/2014    Procedure: Minimally Invasive Right  L1-2 Microdiscectomy;  Surgeon: [name], MD;  Location: [facility];  Service: Orthopedics;  Laterality: N/A;  . Joint replacement    . Back surgery      16 back surgeries  . Lumbar fusion N/A 08/01/2015    Procedure: Right sided L1-2 and L2-3 transforaminal lumbar interbody fusion with cages, Extension of posterior fusion T12 to L3, Replaced pedicle screws bilaterally L1-L2 , Replaced left sided pedicle screws L-3. Instrumentation T12 to L3 using local bone graft, Vivigen allograft and cancellous chips;  Surgeon: [name], MD;  Location: [facility];  Service: Orthopedics;  Laterality: N/A;   Family History  Problem Relation Age of Onset  . Kidney disease Mother   . Heart disease Father   . Anuerysm Brother 29    brain  . Heart disease Brother   . Heart disease Sister  62    s/p CABG  . Hypertension Sister    Social History  Substance Use Topics  . Smoking status: Current Every Day Smoker -- 1.25 packs/day for 45 years    Types: Cigarettes  . Smokeless tobacco: Never Used     Comment: trying to quit  . Alcohol Use: No   OB History    No data available     Review of Systems  Constitutional: Negative for fever and chills.  Respiratory: Negative for shortness of breath and wheezing.   Cardiovascular: Positive for chest pain.  Musculoskeletal: Positive for back pain.  All other systems reviewed and are negative.     Allergies  Amoxicillin; Chlorzoxazone; Codeine; Darvocet; Dilaudid; Flagyl; Keflex; Morphine and related; Nitrofurantoin monohyd macro; Percocet; and Sulfa antibiotics  Home Medications   Prior to Admission medications   Medication Sig Start Date End Date Taking? Authorizing Provider  acetaminophen (TYLENOL) 500 MG tablet Take 1,000 mg by mouth every 6 (six) hours as needed for headache.     Historical Provider, MD  albuterol (PROVENTIL HFA;VENTOLIN HFA) 108 (90 BASE) MCG/ACT inhaler Inhale 1 puff into the lungs every 6 (six) hours as needed for wheezing or shortness of breath. 12/21/14   Chesley Mires, MD  amLODipine (NORVASC) 10 MG tablet Take 1 tablet (10 mg total) by mouth daily. 10/24/15 10/23/16  Juluis Mire, MD  benazepril (LOTENSIN) 40 MG tablet TAKE 1 TABLET BY MOUTH DAILY. 10/27/15   Florinda Marker, MD  carisoprodol (SOMA) 350 MG tablet Take 1 tablet (350 mg total) by mouth 3 (three) times daily. Patient not taking: Reported on 10/24/2015 08/04/15   [name], MD  clonazePAM (KLONOPIN) 2 MG tablet Take 1 tablet (2 mg total) by mouth 2 (two) times daily as needed. 11/29/15   Meredith Staggers, MD  fentaNYL (DURAGESIC - DOSED MCG/HR) 75 MCG/HR Place 1 patch (75 mcg total) onto the skin every 3 (three) days. 08/05/15   [name], MD  ferrous gluconate (FERGON) 324 MG tablet Take 1 tablet (324 mg total) by mouth 3 (three) times  daily with meals. 09/07/15   Alexa Angela Burke, MD  fluticasone (FLONASE) 50 MCG/ACT nasal spray Place 1 spray into both nostrils daily. 11/22/15 11/16/16  Alexa Angela Burke, MD  gabapentin (NEURONTIN) 300 MG capsule TAKE 1 CAPSULE BY MOUTH 2 TIMES DAILY. Patient not taking: Reported on 10/24/2015 10/13/15   Meredith Staggers, MD  guaiFENesin-dextromethorphan [facility] DM) 100-10 MG/5ML syrup Take 5 mLs by mouth every 4 (four) hours as needed for cough. Patient not taking: Reported on 10/24/2015 10/13/15   Ejiroghene Arlyce Dice, MD  hydrochlorothiazide (MICROZIDE) 12.5 MG capsule Take 1 capsule (12.5 mg  total) by mouth daily. Patient not taking: Reported on 10/24/2015 10/13/15   Ejiroghene Arlyce Dice, MD  HYDROcodone-acetaminophen (NORCO) 10-325 MG tablet Take 1-2 tablets by mouth every 4 (four) hours as needed (breakthrough pain). 08/04/15   [name], MD  levothyroxine (SYNTHROID, LEVOTHROID) 75 MCG tablet Take 1 tablet (75 mcg total) by mouth daily before breakfast. 09/07/15   Florinda Marker, MD  meclizine (ANTIVERT) 25 MG tablet Take 1 tablet (25 mg total) by mouth 2 (two) times daily as needed for nausea. Reported on 10/24/2015 11/07/15   Florinda Marker, MD  naloxone [facility]) 0.4 MG/ML injection Inject 1 mL (0.4 mg total) into the vein as needed. 09/06/15   Alexa Angela Burke, MD  omeprazole (PRILOSEC) 20 MG capsule TAKE 1 CAPSULE BY MOUTH 2 TIMES DAILY BEFORE A MEAL. 08/17/15   Florinda Marker, MD  pravastatin (PRAVACHOL) 40 MG tablet TAKE 1 TABLET BY MOUTH DAILY. Patient taking differently: TAKE 1 TABLET BY MOUTH DAILY AT BEDTIME 01/09/15   Alexa Angela Burke, MD  Psyllium (VEGETABLE LAXATIVE PO) Take 2 tablets by mouth at bedtime. Reported on 10/24/2015    Historical Provider, MD  QUEtiapine (SEROQUEL) 50 MG tablet Take 100 mg by mouth at bedtime.  08/05/14   Historical Provider, MD  rOPINIRole (REQUIP) 0.5 MG tablet Take 2 tablets (1 mg total) by mouth at bedtime. Take 0.5 mg q hs for one week then 1 mg q hs thereafter. 11/06/15    Meredith Staggers, MD  senna-docusate (SENOKOT-S) 8.6-50 MG per tablet Take 2 tablets by mouth at bedtime as needed for mild constipation. Patient not taking: Reported on 10/24/2015 05/22/15   Alexa Angela Burke, MD  SPIRIVA HANDIHALER 18 MCG inhalation capsule PLACE 1 CAPSULE INTO INHALER AND INHALE DAILY. 10/13/15   Chesley Mires, MD  valACYclovir (VALTREX) 500 MG tablet TAKE 1 TABLET BY MOUTH DAILY. 05/29/15   Florinda Marker, MD  venlafaxine XR (EFFEXOR-XR) 150 MG 24 hr capsule TAKE 2 CAPSULES (300 MG TOTAL) BY MOUTH DAILY WITH BREAKFAST. Patient taking differently: TAKE 1 CAPSULE (150 MG) BY MOUTH TWICE DAILY -BREAKFAST AND SUPPER 06/20/15   Alexa Angela Burke, MD  VOLTAREN 1 % GEL APPLY 2 GRAMS TO AFFECTED AREA 3 TIMES A DAY AS NEEDED FOR PAIN. Patient taking differently: APPLY  TO AFFECTED AREA 3 TIMES A DAY AS NEEDED FOR PAIN. 05/30/15   Meredith Staggers, MD   BP 123/67 mmHg  Pulse 83  Temp(Src) 97.9 F (36.6 C) (Oral)  Resp 18  SpO2 96% Physical Exam  Constitutional: She appears well-developed and well-nourished.  HENT:  Head: Normocephalic.  Eyes: Pupils are equal, round, and reactive to light.  Neck: Normal range of motion.  Cardiovascular: Normal rate and regular rhythm.   Pulmonary/Chest: Effort normal and breath sounds normal. She exhibits tenderness.  Abdominal: Soft.  Musculoskeletal: Normal range of motion.  Neurological: She is alert.  Skin: Skin is warm and dry.  Nursing note and vitals reviewed.   ED Course  Procedures (including critical care time) Labs Review Labs Reviewed - No data to display  Imaging Review Dg Chest 2 View  12/06/2015  CLINICAL DATA:  Left rib and anterior lower chest pain. Pain for 1 week, worse this morning. EXAM: CHEST  2 VIEW COMPARISON:  08/02/2015 FINDINGS: The cardiomediastinal contours are normal. Pulmonary vasculature is normal. No consolidation, pleural effusion, or pneumothorax. Patient is rotated. Postsurgical change in the lower thoracic and  lumbar spine. No evident displaced rib fracture or acute  rib abnormalities noted. IMPRESSION: No acute pulmonary process. Electronically Signed   By: Jeb Levering M.D.   On: 12/06/2015 02:35   I have personally reviewed and evaluated these images and lab results as part of my medical decision-making.   EKG Interpretation None    No additional pain medication at been prescribed for this patient.  She does wear a Duragesic patch as well as take Percocet for breakthrough pain .  X-ray shows no pneumonia or effusion  MDM   Final diagnoses:  Pleurisy         Junius Creamer, NP 12/06/15 [place], MD 12/06/15 [phone]

## 2015-12-06 NOTE — Discharge Instructions (Signed)
Your x-ray shows no pneumonia.  Rib fractures.  Your back hardware looks intact .  You're any take pain medication on a regular basis have not added any pain medication to your therapy.  Please make an appointment with your primary care physician for follow-up   Pleurisy Pleurisy is redness, puffiness (swelling), and soreness (inflammation) of the lining of the lungs. It can be hard to breathe and hurt to breathe. Coughing or deep breathing will make it hurt more. It is often caused by an existing infection or disease.  HOME CARE  Only take medicine as told by your doctor.  Only take antibiotic medicine as directed. Make sure to finish it even if you start to feel better. GET HELP RIGHT AWAY IF:   Your lips, fingernails, or toenails are blue or dark.  You cough up blood.  You have a hard time breathing.  Your pain is not controlled with medicine or it lasts for more than 1 week.  Your pain spreads (radiates) into your neck, arms, or jaw.  You are short of breath or wheezing.  You develop a fever, rash, throw up (vomit), or faint. MAKE SURE YOU:   Understand these instructions.  Will watch your condition.  Will get help right away if you are not doing well or get worse.   This information is not intended to replace advice given to you by your health care provider. Make sure you discuss any questions you have with your health care provider.   Document Released: 08/01/2008 Document Revised: 04/21/2013 Document Reviewed: 01/31/2013 Elsevier Interactive Patient Education [employer].

## 2015-12-06 NOTE — ED Notes (Signed)
Pt states her "friend" is on the way to pick her up and no longer needs [facility] for transportation home.

## 2015-12-06 NOTE — Telephone Encounter (Signed)
Pt calls and states she has pleurisy, she was very disappointed in her care at ED, she states she needs some abx, states her pain in her rib area to back and front area of ribs is 10/10. She refuses an appt and states she needs some abx from what she has been reading. She is using inhalers more often.

## 2015-12-06 NOTE — Telephone Encounter (Signed)
Pt calls and states she was told she had pluer

## 2015-12-06 NOTE — Telephone Encounter (Signed)
Wants to speak to nurse

## 2015-12-07 ENCOUNTER — Ambulatory Visit (INDEPENDENT_AMBULATORY_CARE_PROVIDER_SITE_OTHER): Payer: Medicare Other | Admitting: Internal Medicine

## 2015-12-07 ENCOUNTER — Encounter: Payer: Self-pay | Admitting: Internal Medicine

## 2015-12-07 ENCOUNTER — Other Ambulatory Visit: Payer: Self-pay | Admitting: Physical Medicine & Rehabilitation

## 2015-12-07 ENCOUNTER — Ambulatory Visit (HOSPITAL_COMMUNITY)
Admission: RE | Admit: 2015-12-07 | Discharge: 2015-12-07 | Disposition: A | Discharge status: Home / Self Care | Payer: Medicare Other | Source: Ambulatory Visit | Attending: Internal Medicine | Admitting: Internal Medicine

## 2015-12-07 ENCOUNTER — Other Ambulatory Visit: Payer: Self-pay | Admitting: Internal Medicine

## 2015-12-07 VITALS — BP 110/62 | HR 94 | Temp 98.0°F | Ht 64.0 in | Wt 153.2 lb

## 2015-12-07 DIAGNOSIS — R0781 Pleurodynia: Secondary | ICD-10-CM | POA: Diagnosis not present

## 2015-12-07 DIAGNOSIS — Z981 Arthrodesis status: Secondary | ICD-10-CM | POA: Diagnosis not present

## 2015-12-07 DIAGNOSIS — M4326 Fusion of spine, lumbar region: Secondary | ICD-10-CM | POA: Diagnosis not present

## 2015-12-07 DIAGNOSIS — F17210 Nicotine dependence, cigarettes, uncomplicated: Secondary | ICD-10-CM

## 2015-12-07 MED ORDER — CYCLOBENZAPRINE HCL 10 MG PO TABS: 10.0000 mg | ORAL_TABLET | Freq: Every day | ORAL | Status: DC

## 2015-12-07 NOTE — Telephone Encounter (Signed)
Finally agreed to come in today at 60 dr Marlowe Sax

## 2015-12-07 NOTE — Telephone Encounter (Signed)
Thank you. I agree she should come in. I read through the ED note but I am not clear if they are thinking this is due to pain whereas the patient feels she has an infection.  Thanks again. [place]

## 2015-12-07 NOTE — Patient Instructions (Addendum)
Take Flexeril 10 mg: 1 tablet by mouth at bedtime.  Continue using Voltaren gel as directed.   I have ordered an x-ray of your back and will call you when the results come back.  Please return for a follow-up visit in 1 week.

## 2015-12-07 NOTE — Telephone Encounter (Signed)
Pt wants to talk to nurse

## 2015-12-08 ENCOUNTER — Other Ambulatory Visit: Payer: Self-pay | Admitting: Internal Medicine

## 2015-12-08 NOTE — Telephone Encounter (Signed)
Refusing-just approved 4/6

## 2015-12-08 NOTE — Progress Notes (Signed)
Patient ID: Jill Phillips, female   DOB: 07-05-1956, 60 y.o.   MRN: [id number]   Subjective:   Patient ID: Jill Phillips female   DOB: [birth year] 60 y.o.   MRN: [id number]  HPI: Ms.Amorah C Borrello is a 60 y.o. F with a PMHx of postlaminectomy syndrome, OA, chronic pain syndrome, lumbago and conditions listed below presenting to the clinic with a complaint of back and side pain. States she is having L sided rib pain that radiates to her left lower back and is aching in nature. States the pain initially started in January, resolved, and then started again 3 days ago. Denies any falls or trauma to the area. Denies lifting any heavy objects. States pain is worse when walking, lying down, or raising her arm. Nothing makes the pain better. She is currently on Hydrocodone and Fentanyl patch prescribed by the pain clinic but they are not relieving her pain. She was previously prescribed Voltaren gel and it is not clear from the history whether the patient has been using it or not. Denies having any focal weakness/ numbness or bowel/ bladder incontinence. Denies any symptoms of saddle anesthesia. Patient reports having 18 back surgeries with the most recent one being in 07/2015. Denies having any dysuria, frequency, or urgency. Patient does state that her pain is worse when she takes a deep breath but denies having any associated fevers, chills, or cough. Denies any viral URI symptoms. States she is chronically short of breath due to history of emphysema.     Past Medical History  Diagnosis Date  . Postlaminectomy syndrome, thoracic region   . Osteoarthrosis, unspecified whether generalized or localized, lower leg   . Dysthymic disorder   . Calcifying tendinitis of shoulder   . Pain in joint, upper arm   . Chronic pain syndrome   . Lumbago   . Primary localized osteoarthrosis, lower leg   . Hypertension   . Hyperlipidemia   . GERD (gastroesophageal reflux disease)   . Thyroid disease   . Restless leg  syndrome   . History of blood transfusion 1980  . Hypothyroidism   . Active smoker   . COPD (chronic obstructive pulmonary disease) ([place])     on nocturnal home o2  . Heart murmur     for years, nothing to be concerned about  . Sleep apnea     s/p surgery- last sleep study 2011- doesnt use oxygen or machine at night as instructed,.   12/2014- Dr Halford Chessman  reports it is negative.  [name] Anxiety   . Herpes genitalia   . PONV (postoperative nausea and vomiting)     gets nauseous  with longer surgery. Difficuty voiding after surgery   Current Outpatient Prescriptions  Medication Sig Dispense Refill  . acetaminophen (TYLENOL) 500 MG tablet Take 1,000 mg by mouth every 6 (six) hours as needed for headache.     . albuterol (PROVENTIL HFA;VENTOLIN HFA) 108 (90 BASE) MCG/ACT inhaler Inhale 1 puff into the lungs every 6 (six) hours as needed for wheezing or shortness of breath. 1 Inhaler 5  . amLODipine (NORVASC) 10 MG tablet Take 1 tablet (10 mg total) by mouth daily. 30 tablet 3  . benazepril (LOTENSIN) 40 MG tablet TAKE 1 TABLET BY MOUTH DAILY. 30 tablet 11  . carisoprodol (SOMA) 350 MG tablet Take 1 tablet (350 mg total) by mouth 3 (three) times daily. (Patient not taking: Reported on 10/24/2015) 90 tablet 0  . clonazePAM (KLONOPIN) 2 MG tablet Take 1 tablet (  2 mg total) by mouth 2 (two) times daily as needed. 60 tablet 0  . cyclobenzaprine (FLEXERIL) 10 MG tablet Take 1 tablet (10 mg total) by mouth at bedtime. 15 tablet 0  . fentaNYL (DURAGESIC - DOSED MCG/HR) 75 MCG/HR Place 1 patch (75 mcg total) onto the skin every 3 (three) days. 8 patch 0  . ferrous gluconate (FERGON) 324 MG tablet Take 1 tablet (324 mg total) by mouth 3 (three) times daily with meals. 90 tablet 6  . fluticasone (FLONASE) 50 MCG/ACT nasal spray Place 1 spray into both nostrils daily. 16 g 5  . gabapentin (NEURONTIN) 300 MG capsule TAKE 1 CAPSULE BY MOUTH 2 TIMES DAILY. (Patient not taking: Reported on 10/24/2015) 60 capsule 3  .  guaiFENesin-dextromethorphan (ROBITUSSIN DM) 100-10 MG/5ML syrup Take 5 mLs by mouth every 4 (four) hours as needed for cough. (Patient not taking: Reported on 10/24/2015) 118 mL 0  . hydrochlorothiazide (MICROZIDE) 12.5 MG capsule Take 1 capsule (12.5 mg total) by mouth daily. (Patient not taking: Reported on 10/24/2015)    . HYDROcodone-acetaminophen (NORCO) 10-325 MG tablet Take 1-2 tablets by mouth every 4 (four) hours as needed (breakthrough pain). 90 tablet 0  . levothyroxine (SYNTHROID, LEVOTHROID) 75 MCG tablet Take 1 tablet (75 mcg total) by mouth daily before breakfast. 30 tablet 11  . meclizine (ANTIVERT) 25 MG tablet TAKE 1 TABLET BY MOUTH 2 TIMES DAILY AS NEEDED FOR NAUSEA. 60 tablet 0  . naloxone (NARCAN) 0.4 MG/ML injection Inject 1 mL (0.4 mg total) into the vein as needed. 1 mL 3  . omeprazole (PRILOSEC) 20 MG capsule TAKE 1 CAPSULE BY MOUTH 2 TIMES DAILY BEFORE A MEAL. 60 capsule 11  . pravastatin (PRAVACHOL) 40 MG tablet TAKE 1 TABLET BY MOUTH DAILY. (Patient taking differently: TAKE 1 TABLET BY MOUTH DAILY AT BEDTIME) 30 tablet 11  . Psyllium (VEGETABLE LAXATIVE PO) Take 2 tablets by mouth at bedtime. Reported on 10/24/2015    . QUEtiapine (SEROQUEL) 50 MG tablet Take 100 mg by mouth at bedtime.     [name] rOPINIRole (REQUIP) 0.5 MG tablet Take 2 tablets (1 mg total) by mouth at bedtime. Take 0.5 mg q hs for one week then 1 mg q hs thereafter. 60 tablet 1  . senna-docusate (SENOKOT-S) 8.6-50 MG per tablet Take 2 tablets by mouth at bedtime as needed for mild constipation. (Patient not taking: Reported on 10/24/2015) 60 tablet 0  . SPIRIVA HANDIHALER 18 MCG inhalation capsule PLACE 1 CAPSULE INTO INHALER AND INHALE DAILY. 30 capsule 1  . valACYclovir (VALTREX) 500 MG tablet TAKE 1 TABLET BY MOUTH DAILY. 30 tablet 11  . venlafaxine XR (EFFEXOR-XR) 150 MG 24 hr capsule TAKE 2 CAPSULES (300 MG TOTAL) BY MOUTH DAILY WITH BREAKFAST. (Patient taking differently: TAKE 1 CAPSULE (150 MG) BY MOUTH  TWICE DAILY -BREAKFAST AND SUPPER) 60 capsule 11  . VOLTAREN 1 % GEL APPLY 2 GRAMS TO AFFECTED AREA 3 TIMES A DAY AS NEEDED FOR PAIN. 300 g 3   No current facility-administered medications for this visit.   Family History  Problem Relation Age of Onset  . Kidney disease Mother   . Heart disease Father   . Anuerysm Brother 29    brain  . Heart disease Brother   . Heart disease Sister 42    s/p CABG  . Hypertension Sister    Social History   Social History  . Marital Status: Divorced    Spouse Name: n/a  . Number of Children: 2  .  Years of Education: 12+   Occupational History  . disability     back surgeries   Social History Main Topics  . Smoking status: Current Every Day Smoker -- 1.25 packs/day for 45 years    Types: Cigarettes  . Smokeless tobacco: Never Used     Comment: trying to quit  . Alcohol Use: No  . Drug Use: No  . Sexual Activity: Not Asked   Other Topics Concern  . None   Social History Narrative   Lives alone.  One daughter is local, but is getting ready to move to [place], where her children live with their father.  The other daughter lives near [place], [place].   Review of Systems: Review of Systems  Constitutional: Negative for fever and chills.  HENT: Negative for congestion and sore throat.   Eyes: Negative for blurred vision and pain.  Respiratory: Negative for cough, sputum production, shortness of breath and wheezing.   Cardiovascular: Negative for chest pain and leg swelling.  Gastrointestinal: Negative for nausea, vomiting, abdominal pain and diarrhea.  Genitourinary: Negative for dysuria, urgency, frequency and hematuria.  Musculoskeletal: Positive for back pain. Negative for myalgias and falls.  Skin: Negative for itching and rash.  Neurological: Negative for dizziness, sensory change, focal weakness and headaches.   Objective:  Physical Exam: Filed Vitals:   12/07/15 1555  BP: 110/62  Pulse: 94  Temp: 98 F (36.7 C)  TempSrc:  Oral  Height: 5\' 4"  (1.626 m)  Weight: 153 lb 3.2 oz (69.491 kg)  SpO2: 100%   Physical Exam  Constitutional: She is oriented to person, place, and time. She appears well-developed and well-nourished. She appears distressed.  HENT:  Head: Normocephalic and atraumatic.  Mouth/Throat: Oropharynx is clear and moist.  Eyes: EOM are normal. Pupils are equal, round, and reactive to light.  Neck: Normal range of motion. Neck supple. No tracheal deviation present.  Cardiovascular: Normal rate, regular rhythm and intact distal pulses.   Pulmonary/Chest: Effort normal. No respiratory distress. She has no wheezes. She has no rales.  Abdominal: Soft. Bowel sounds are normal. She exhibits no distension. There is no tenderness.  Musculoskeletal: She exhibits tenderness. She exhibits no edema.  Lumbar spine: Limited ROM due to pain. Pain on palpation of paraspinal muscles on the left. No pain on palpation of the lumbar spine. Straight leg raise test negative.  Pain on palpation of ribs on the left, difficult to localize the site of pain.   Neurological: She is alert and oriented to person, place, and time.  Strength and sensation grossly intact in bilateral upper and lower extremities.   Skin: Skin is warm and dry. No rash noted. No erythema.   Assessment & Plan:

## 2015-12-08 NOTE — Assessment & Plan Note (Signed)
Assessment  Patient is presenting with a 3 month history of intermittent L sided rib pain that radiates to her left lumbar region. No  falls or trauma to the area. No lifting of heavy objects. Physical exam remarkable for decreased ROM of lumbar spine and left sided rib and paraspinal muscle tenderness. Straight leg raise test negative. No focal weakness/ numbness or bowel/ bladder incontinence. No symptoms of saddle anesthesia. Pulmonary pathology less likely as she is not having any fevers, chills, cough, or viral URI symptoms. SOB is chronic for her due to history of emphysema. CXR from her ED visit on 12/06/15 was normal. Patient has had multiple back surgeries in the past and I was concerned about displacement of hardware. However, lumbar x-ray ordered during this clinic visit did not show any acute abnormality. Her pain is likely musculoskeletal in nature.  Plan -Continue using Voltaren gel QID -Flexeril 10 mg qhs for muscle spasms  -Reassess in 2 weeks

## 2015-12-11 ENCOUNTER — Telehealth: Payer: Self-pay | Admitting: Internal Medicine

## 2015-12-11 MED ORDER — LIDOCAINE 5 % EX PTCH: 1.0000 | MEDICATED_PATCH | CUTANEOUS | Status: DC

## 2015-12-11 NOTE — Telephone Encounter (Signed)
Spoke w/ pt, she is somewhat negative about the lidocaine patches, she was encouraged to use them and encouraged to be positive and that she may be pleasantly surprised at the results, appt made for may w/ dr burns. She now states "i'm surprised that they didn't do lab work, cause you know it could be my liver or spleen" she is ask if there are reasons she thinks that and is advised that it will be messaged to dr burns. She is again encouraged to give the patches a chance. She reluctantly agrees

## 2015-12-11 NOTE — Telephone Encounter (Signed)
Returned patient phone call regarding her 3 month history of continued pain despite pain medication and muscle relaxers. I have reviewed her imaging personally- CXR and lumbar spine xray which shows her hardware is intact. CXR was without rib fractures or acute abnormality. Admittedly is difficult to see her ribs on the right in entirety. Given its chronicity, I doubt any acute diagnosis such as PE, PNA. No evidence of new compression or rib fracture are seen on imaging. I do not feel further imaging would change our management plan.   Plan: -Add lidoderm patches -Follow up with [facility] in 1-2 weeks  Martyn Malay, DO PGY-2 Internal Medicine Resident Pager # [phone] 12/11/2015 12:55 PM

## 2015-12-11 NOTE — Telephone Encounter (Signed)
Pt calls and states she is doing everything she was ask to do except she is not using flexeril, she has another muscle relaxer that she is using, she is also norco, she states these do not relieve any of her pain, she would like for you to order a ct and see if anything is found. Offered an appt wed 4/12 and she refused. Please advise

## 2015-12-11 NOTE — Telephone Encounter (Signed)
Pt calling back to speak with the nurse.

## 2015-12-11 NOTE — Telephone Encounter (Signed)
Pt requesting to speak to nurse

## 2015-12-11 NOTE — Progress Notes (Signed)
I saw and evaluated the patient. I personally confirmed the key portions of Dr. Elon Jester history and exam and reviewed pertinent patient test results. The assessment, diagnosis, and plan were formulated together and I agree with the documentation in the resident's note.

## 2015-12-12 ENCOUNTER — Encounter: Payer: Medicare Other | Admitting: Physical Medicine & Rehabilitation

## 2015-12-12 ENCOUNTER — Other Ambulatory Visit: Payer: Self-pay | Admitting: Physical Medicine & Rehabilitation

## 2015-12-13 ENCOUNTER — Encounter: Payer: Medicare Other | Attending: Physical Medicine & Rehabilitation | Admitting: Physical Medicine & Rehabilitation

## 2015-12-13 ENCOUNTER — Encounter: Payer: Self-pay | Admitting: Physical Medicine & Rehabilitation

## 2015-12-13 ENCOUNTER — Other Ambulatory Visit: Payer: Self-pay | Admitting: *Deleted

## 2015-12-13 ENCOUNTER — Telehealth: Payer: Self-pay

## 2015-12-13 VITALS — BP 126/72 | HR 102

## 2015-12-13 DIAGNOSIS — M2170 Unequal limb length (acquired), unspecified site: Secondary | ICD-10-CM | POA: Diagnosis not present

## 2015-12-13 DIAGNOSIS — Z9889 Other specified postprocedural states: Secondary | ICD-10-CM | POA: Insufficient documentation

## 2015-12-13 DIAGNOSIS — Z9981 Dependence on supplemental oxygen: Secondary | ICD-10-CM | POA: Diagnosis not present

## 2015-12-13 DIAGNOSIS — E039 Hypothyroidism, unspecified: Secondary | ICD-10-CM | POA: Diagnosis not present

## 2015-12-13 DIAGNOSIS — K219 Gastro-esophageal reflux disease without esophagitis: Secondary | ICD-10-CM | POA: Insufficient documentation

## 2015-12-13 DIAGNOSIS — M5126 Other intervertebral disc displacement, lumbar region: Secondary | ICD-10-CM | POA: Insufficient documentation

## 2015-12-13 DIAGNOSIS — M5136 Other intervertebral disc degeneration, lumbar region: Secondary | ICD-10-CM | POA: Diagnosis not present

## 2015-12-13 DIAGNOSIS — Z720 Tobacco use: Secondary | ICD-10-CM | POA: Diagnosis not present

## 2015-12-13 DIAGNOSIS — M7918 Myalgia, other site: Secondary | ICD-10-CM

## 2015-12-13 DIAGNOSIS — Z5181 Encounter for therapeutic drug level monitoring: Secondary | ICD-10-CM | POA: Insufficient documentation

## 2015-12-13 DIAGNOSIS — G894 Chronic pain syndrome: Secondary | ICD-10-CM | POA: Diagnosis not present

## 2015-12-13 DIAGNOSIS — I10 Essential (primary) hypertension: Secondary | ICD-10-CM | POA: Diagnosis not present

## 2015-12-13 DIAGNOSIS — M961 Postlaminectomy syndrome, not elsewhere classified: Secondary | ICD-10-CM | POA: Insufficient documentation

## 2015-12-13 DIAGNOSIS — G2581 Restless legs syndrome: Secondary | ICD-10-CM | POA: Diagnosis not present

## 2015-12-13 DIAGNOSIS — Z79899 Other long term (current) drug therapy: Secondary | ICD-10-CM | POA: Insufficient documentation

## 2015-12-13 DIAGNOSIS — J449 Chronic obstructive pulmonary disease, unspecified: Secondary | ICD-10-CM | POA: Diagnosis not present

## 2015-12-13 DIAGNOSIS — E079 Disorder of thyroid, unspecified: Secondary | ICD-10-CM | POA: Insufficient documentation

## 2015-12-13 DIAGNOSIS — E785 Hyperlipidemia, unspecified: Secondary | ICD-10-CM | POA: Diagnosis not present

## 2015-12-13 DIAGNOSIS — M170 Bilateral primary osteoarthritis of knee: Secondary | ICD-10-CM | POA: Diagnosis not present

## 2015-12-13 DIAGNOSIS — R1032 Left lower quadrant pain: Secondary | ICD-10-CM

## 2015-12-13 DIAGNOSIS — G9619 Other disorders of meninges, not elsewhere classified: Secondary | ICD-10-CM | POA: Diagnosis not present

## 2015-12-13 DIAGNOSIS — M4316 Spondylolisthesis, lumbar region: Secondary | ICD-10-CM

## 2015-12-13 DIAGNOSIS — M791 Myalgia: Secondary | ICD-10-CM

## 2015-12-13 MED ORDER — FENTANYL 50 MCG/HR TD PT72: 50.0000 ug | MEDICATED_PATCH | TRANSDERMAL | Status: DC

## 2015-12-13 MED ORDER — CLONAZEPAM 2 MG PO TABS: 2.0000 mg | ORAL_TABLET | Freq: Two times a day (BID) | ORAL | Status: DC | PRN

## 2015-12-13 MED ORDER — HYDROCODONE-ACETAMINOPHEN 10-325 MG PO TABS: 1.0000 | ORAL_TABLET | Freq: Four times a day (QID) | ORAL | Status: DC | PRN

## 2015-12-13 MED ORDER — CARISOPRODOL 350 MG PO TABS: 350.0000 mg | ORAL_TABLET | Freq: Three times a day (TID) | ORAL | Status: DC

## 2015-12-13 NOTE — Telephone Encounter (Signed)
Pt called to report findings from her visit with pain MD today, referral for PT and a CT abdomen - Dr. Quay Burow just FYI from patient, she would like you to review his notes and new orders

## 2015-12-13 NOTE — Progress Notes (Signed)
Subjective:    Patient ID: ALANI FINKE, female    DOB: 08/27/1956, 60 y.o.   MRN: [id number]  HPI  Raela is here in follow up of her chronic pain. She has had increased pain in her left flank/lower ribs which began in February and really increased at the end of March. Laying down on her back makes it worse. She has found nothing which relieves pain yet. She has been to the ED with no diagnosis being made. A CXR which was done was unremarkable. In addition to the pain she has noticed decreased appetite.    Since I last saw her. Dr. [name] performed spinal surgery in November. He is no longer writing any pain medication for her. She has been on fentanyl and hydrocodone.     Pain Inventory Average Pain 8 Pain Right Now 8 My pain is burning and aching  In the last 24 hours, has pain interfered with the following? General activity 10 Relation with others 10 Enjoyment of life 10 What TIME of day is your pain at its worst? night Sleep (in general) Fair  Pain is worse with: walking and laying down Pain improves with: medication Relief from Meds: 4  Mobility walk without assistance ability to climb steps?  yes do you drive?  no  Function disabled: date disabled . I need assistance with the following:  meal prep and shopping  Neuro/Psych trouble walking  Prior Studies Any changes since last visit?  no  Physicians involved in your care Any changes since last visit?  no   Family History  Problem Relation Age of Onset  . Kidney disease Mother   . Heart disease Father   . Anuerysm Brother 29    brain  . Heart disease Brother   . Heart disease Sister 87    s/p CABG  . Hypertension Sister    Social History   Social History  . Marital Status: Divorced    Spouse Name: n/a  . Number of Children: 2  . Years of Education: 12+   Occupational History  . disability     back surgeries   Social History Main Topics  . Smoking status: Current Every Day Smoker -- 1.25  packs/day for 45 years    Types: Cigarettes  . Smokeless tobacco: Never Used     Comment: trying to quit  . Alcohol Use: No  . Drug Use: No  . Sexual Activity: Not Asked   Other Topics Concern  . None   Social History Narrative   Lives alone.  One daughter is local, but is getting ready to move to [place], where her children live with their father.  The other daughter lives near [place], [place].   Past Surgical History  Procedure Laterality Date  . Appendectomy    . Abdominal hysterectomy    . Tubal ligation    . Spine surgery      thoracic x 1,  lumbar x 15  . Right hip replacement    . Knee surgeries r knee      arthroscopy- right  . Hammer toe surgery    . Sleep apnea surgery    . Cholecystectomy    . Total knee arthroplasty  05/29/2012    Procedure: TOTAL KNEE ARTHROPLASTY;  Surgeon: Mcarthur Rossetti, MD;  Location: [facility];  Service: Orthopedics;  Laterality: Right;  Right Total Knee Arthroplasty  . Lumbar laminectomy/decompression microdiscectomy N/A 01/28/2014    Procedure: Minimally Invasive Right  L1-2 Microdiscectomy;  Surgeon:  [name], MD;  Location: [facility];  Service: Orthopedics;  Laterality: N/A;  . Joint replacement    . Back surgery      16 back surgeries  . Lumbar fusion N/A 08/01/2015    Procedure: Right sided L1-2 and L2-3 transforaminal lumbar interbody fusion with cages, Extension of posterior fusion T12 to L3, Replaced pedicle screws bilaterally L1-L2 , Replaced left sided pedicle screws L-3. Instrumentation T12 to L3 using local bone graft, Vivigen allograft and cancellous chips;  Surgeon: [name], MD;  Location: [facility];  Service: Orthopedics;  Laterality: N/A;   Past Medical History  Diagnosis Date  . Postlaminectomy syndrome, thoracic region   . Osteoarthrosis, unspecified whether generalized or localized, lower leg   . Dysthymic disorder   . Calcifying tendinitis of shoulder   . Pain in joint, upper arm   . Chronic pain syndrome   .  Lumbago   . Primary localized osteoarthrosis, lower leg   . Hypertension   . Hyperlipidemia   . GERD (gastroesophageal reflux disease)   . Thyroid disease   . Restless leg syndrome   . History of blood transfusion 1980  . Hypothyroidism   . Active smoker   . COPD (chronic obstructive pulmonary disease) ([place])     on nocturnal home o2  . Heart murmur     for years, nothing to be concerned about  . Sleep apnea     s/p surgery- last sleep study 2011- doesnt use oxygen or machine at night as instructed,.   12/2014- Dr Halford Chessman  reports it is negative.  [name] Anxiety   . Herpes genitalia   . PONV (postoperative nausea and vomiting)     gets nauseous  with longer surgery. Difficuty voiding after surgery    Opioid Risk Score:   Fall Risk Score:  `1  Depression screen PHQ 2/9  Depression screen [facility] 2/9 12/07/2015 11/15/2015 10/13/2015 09/06/2015 05/22/2015 02/15/2015 11/24/2014  Decreased Interest 0 0 0 0 0 0 0  Down, Depressed, Hopeless 0 0 - 0 0 0 0  PHQ - 2 Score 0 0 0 0 0 0 0  Altered sleeping - - - - - - 3  Tired, decreased energy - - - - - - 3  Change in appetite - - - - - - 1  Feeling bad or failure about yourself  - - - - - - 0  Trouble concentrating - - - - - - 0  Moving slowly or fidgety/restless - - - - - - 0  Suicidal thoughts - - - - - - 0  PHQ-9 Score - - - - - - 7     Review of Systems  All other systems reviewed and are negative.      Objective:   Physical Exam  Constitutional: She is oriented to person, place, and time. She appears well-developed and well-nourished.  HENT:  Head: Normocephalic.  Eyes: EOM are normal. Pupils are equal, round, and reactive to light.  Neck: Normal range of motion.  Cardiovascular: Normal rate.  Pulmonary/Chest: Effort normal.  Abdominal: Soft.  Musculoskeletal:  Right knee with minimal tenderness. Post-op scars noted. Knee rom 120+ with flexion. Weight bearing better with minimal antalgia seen.  Right shoulder with only mild tenderness  with ROM. No shoulder instability noted. Had full AROM. Low back/pelvic with persistent hemi-elevation of the left pelvis and tightness of the lumbar paraspinals on the left. Her last rib comes close to contacting the left iliac crest. She developed  substantial pain after I palpated in this area.. Low back ROM is limited today due to pain. Sensory exam in tact in either leg. Strength fairly functional Neurological: She is alert and oriented to person, place, and time.  Skin: Skin is warm.  Psychiatric: She has a normal mood and affect. Her behavior is normal. Judgment and thought content normal. She was very alert. Assessment & Plan:   ASSESSMENT:  1. 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 Has elevation of left hemipelvis and substantial spasm. I question whether her iliac crest is abutting the rib cage.  2. Osteoarthritis of the knees bilaterally, right greater than left.  3. Rotator cuff syndrome/subacromial bursitis.  4. Restless legs syndrome.  5. O2 dependent at night  6. Tobacco abuse.  7. Right leg length discrepancy. 8. LLQ pain, with decreased appetite.     PLAN:  1. Refilled fentanyl to 50 mcg every 72 hours. Hydrocodone was refilled #120. 2. Continue with 4mg  klonopin at night. Continue with gabapentin also. Increase mirapex to 0.25mg  1-2 tabs HS.  3. Referral to [facility] to address ROM/muscle spasm, modailities, etc. .  4. CT of abdomen/pelvis to rule out intra-abdominal process contributing to pain. May also give [place] a more complete view of ribcage's potential interaction with pelvis. 6. Follow up with me or NP in 2 month. 30 minutes of face to face patient care time were spent during this visit. All questions were encouraged and answered.

## 2015-12-13 NOTE — Patient Instructions (Signed)
  PLEASE CALL ME WITH ANY PROBLEMS OR QUESTIONS (#[phone]).      

## 2015-12-16 DIAGNOSIS — G4734 Idiopathic sleep related nonobstructive alveolar hypoventilation: Secondary | ICD-10-CM | POA: Diagnosis not present

## 2015-12-20 ENCOUNTER — Telehealth: Payer: Self-pay | Admitting: Physical Medicine & Rehabilitation

## 2015-12-20 ENCOUNTER — Telehealth: Payer: Self-pay | Admitting: *Deleted

## 2015-12-20 LAB — TOXASSURE SELECT,+ANTIDEPR,UR: PDF: 0

## 2015-12-20 MED ORDER — METHYLPREDNISOLONE 4 MG PO TBPK: ORAL_TABLET | ORAL | Status: DC

## 2015-12-20 NOTE — Telephone Encounter (Signed)
Medrol dose pack. No refills. i reviewed plan with her last week.

## 2015-12-20 NOTE — Telephone Encounter (Signed)
Patient in extreme pain and states the pain medication is not working, needs to know what she can do.

## 2015-12-20 NOTE — Telephone Encounter (Signed)
Order sent and Rodena Piety notified.

## 2015-12-20 NOTE — Telephone Encounter (Signed)
Received PA request from pt's pharmacy for her Lidoderm patches. Call made to [facility] to initiate PA over the phone. Insurance given list of analgesics listed in her allergy list and the one's she's currently taking (vicodina and fentanyl patches) .  Request sent for "review".Despina Hidden Cassady4/19/20174:02 PM      [facility] [phone] ID# [id number]

## 2015-12-20 NOTE — Telephone Encounter (Signed)
Please advise. Fentanyl and Hydrocodone both refilled at visit last week.

## 2015-12-21 ENCOUNTER — Other Ambulatory Visit: Payer: Self-pay | Admitting: Internal Medicine

## 2015-12-22 NOTE — Progress Notes (Signed)
Urine drug screen for this encounter is consistent for prescribed medication 

## 2015-12-25 ENCOUNTER — Ambulatory Visit: Payer: Medicare Other | Admitting: Physical Therapy

## 2015-12-25 ENCOUNTER — Telehealth: Payer: Self-pay

## 2015-12-25 DIAGNOSIS — R1032 Left lower quadrant pain: Secondary | ICD-10-CM

## 2015-12-25 NOTE — Telephone Encounter (Signed)
VO given to [place] for BUN CR.

## 2015-12-25 NOTE — Telephone Encounter (Signed)
Jill Phillips from [facility] called to change the CT order. She states that due to LLQ pain, the order needs to be CT of abdomen and Pelvic with contrast. Also, she will need lab work done as well prior to scan. Please advise on order change?

## 2015-12-25 NOTE — Telephone Encounter (Signed)
Ct abdomen/pelvis ordered. If they want a BUN/Cr we will need to figure out where she will have it drawn

## 2015-12-26 ENCOUNTER — Inpatient Hospital Stay: Admission: RE | Admit: 2015-12-26 | Payer: Medicare Other | Source: Ambulatory Visit

## 2015-12-26 ENCOUNTER — Ambulatory Visit
Admission: RE | Admit: 2015-12-26 | Discharge: 2015-12-26 | Disposition: A | Discharge status: Home / Self Care | Payer: Medicare Other | Source: Ambulatory Visit | Attending: Physical Medicine & Rehabilitation | Admitting: Physical Medicine & Rehabilitation

## 2015-12-26 ENCOUNTER — Telehealth: Payer: Self-pay | Admitting: Physical Medicine & Rehabilitation

## 2015-12-26 DIAGNOSIS — R1032 Left lower quadrant pain: Secondary | ICD-10-CM | POA: Diagnosis not present

## 2015-12-26 MED ORDER — IOPAMIDOL (ISOVUE-300) INJECTION 61%
100.0000 mL | Freq: Once | INTRAVENOUS | Status: AC | PRN
  Administered 2015-12-26: 100 mL via INTRAVENOUS

## 2015-12-26 NOTE — Telephone Encounter (Signed)
Request was denied, but pt is now being followed by pain clinic.Despina Hidden Cassady4/25/20178:45 AM

## 2015-12-26 NOTE — Telephone Encounter (Signed)
Patient has been taking a prednisone pack and it is making her nervous, sweating, hands shake and sick on her stomach.  Patient needs to know what to do.  Please call her at [phone].

## 2015-12-27 NOTE — Telephone Encounter (Signed)
Then she needs to stop the prednisone.  Would apply aggressive ice and alternate with moist heat to painful abdominal/rib area

## 2015-12-27 NOTE — Telephone Encounter (Signed)
LM with ZS advise.

## 2015-12-28 NOTE — Telephone Encounter (Signed)
Please let Jill Phillips know that her abdominal and pelvic CT was essentially unremarkable. Certainly nothing which would account for her left side/LLQ pain

## 2015-12-29 ENCOUNTER — Telehealth: Payer: Self-pay

## 2015-12-29 NOTE — Telephone Encounter (Signed)
A therapy referral was made to [facility] to help address this pain. I don't see any notes from them. What is the status of the therapy?  She may want to follow up with her Dr. [name] for a surgical opinion regarding the pain also

## 2015-12-29 NOTE — Telephone Encounter (Signed)
Advised [name].

## 2015-12-29 NOTE — Telephone Encounter (Signed)
Advised pt of CT scan results. She states that she is still in a lot of pain. Pt states "On a scale of 0-10, my pain is an 18"..Pt would like to know what to do?

## 2016-01-01 NOTE — Telephone Encounter (Signed)
Jill Phillips. Please check on the status of this therapy. Please and thank you

## 2016-01-03 NOTE — Telephone Encounter (Signed)
Called and spoke with patient about her call. I explained that Dr. Naaman Plummer sent a PT referral and suggested a surgical evaluation with [facility]. Since the patient has not done either of these and in fact refused the PT his options are limited. I told the patient to call and schedule both the evaluation with [facility] and an evaluation with PT. She agreed and said she would call them today. She stated she had the numbers in her phone when I offered to give her the phone numbers.

## 2016-01-04 ENCOUNTER — Telehealth: Payer: Self-pay | Admitting: *Deleted

## 2016-01-04 ENCOUNTER — Other Ambulatory Visit: Payer: Self-pay | Admitting: Physical Medicine & Rehabilitation

## 2016-01-04 NOTE — Telephone Encounter (Signed)
Received incoming fax from [facility] for pt's refill on clonazepam. Sig states take 1 tablet by mouth 2 times a day as needed. Dr. Naaman Plummer wrote in his last clinic note to take 4mg  at bedtime......Marland Kitchenplease advise on refill

## 2016-01-05 MED ORDER — CLONAZEPAM 2 MG PO TABS: 2.0000 mg | ORAL_TABLET | Freq: Two times a day (BID) | ORAL | Status: DC | PRN

## 2016-01-05 NOTE — Telephone Encounter (Signed)
It can be 2mg , one to two at bedtime.  Same # per day/per month

## 2016-01-05 NOTE — Telephone Encounter (Signed)
rx filled

## 2016-01-09 ENCOUNTER — Telehealth: Payer: Self-pay | Admitting: *Deleted

## 2016-01-09 NOTE — Telephone Encounter (Signed)
Pt complains of increased pain on her left side rib cage. She complains that her ribs are very, very painful. She says one tab is not helping and asks if she can take two tablets? She has an upcoming appt. with Zella Ball on the 12th of May, 2017

## 2016-01-09 NOTE — Telephone Encounter (Signed)
No she may not. She can come in and we can attempt an injection at painful rib

## 2016-01-10 ENCOUNTER — Encounter: Payer: Medicare Other | Admitting: Internal Medicine

## 2016-01-10 NOTE — Telephone Encounter (Signed)
Spoke with pt. Pt. Advised. Patient states that she will discuss further at her next appointment.

## 2016-01-12 ENCOUNTER — Encounter: Payer: Self-pay | Admitting: Registered Nurse

## 2016-01-12 ENCOUNTER — Encounter: Payer: Medicare Other | Attending: Physical Medicine & Rehabilitation | Admitting: Registered Nurse

## 2016-01-12 VITALS — BP 117/70 | HR 93 | Resp 14

## 2016-01-12 DIAGNOSIS — G2581 Restless legs syndrome: Secondary | ICD-10-CM | POA: Diagnosis not present

## 2016-01-12 DIAGNOSIS — Z720 Tobacco use: Secondary | ICD-10-CM

## 2016-01-12 DIAGNOSIS — M961 Postlaminectomy syndrome, not elsewhere classified: Secondary | ICD-10-CM | POA: Diagnosis not present

## 2016-01-12 DIAGNOSIS — M2170 Unequal limb length (acquired), unspecified site: Secondary | ICD-10-CM | POA: Insufficient documentation

## 2016-01-12 DIAGNOSIS — E039 Hypothyroidism, unspecified: Secondary | ICD-10-CM | POA: Diagnosis not present

## 2016-01-12 DIAGNOSIS — K219 Gastro-esophageal reflux disease without esophagitis: Secondary | ICD-10-CM | POA: Diagnosis not present

## 2016-01-12 DIAGNOSIS — M170 Bilateral primary osteoarthritis of knee: Secondary | ICD-10-CM | POA: Insufficient documentation

## 2016-01-12 DIAGNOSIS — G894 Chronic pain syndrome: Secondary | ICD-10-CM

## 2016-01-12 DIAGNOSIS — Z9889 Other specified postprocedural states: Secondary | ICD-10-CM | POA: Diagnosis not present

## 2016-01-12 DIAGNOSIS — M5126 Other intervertebral disc displacement, lumbar region: Secondary | ICD-10-CM | POA: Insufficient documentation

## 2016-01-12 DIAGNOSIS — M4316 Spondylolisthesis, lumbar region: Secondary | ICD-10-CM

## 2016-01-12 DIAGNOSIS — E785 Hyperlipidemia, unspecified: Secondary | ICD-10-CM | POA: Insufficient documentation

## 2016-01-12 DIAGNOSIS — I10 Essential (primary) hypertension: Secondary | ICD-10-CM | POA: Insufficient documentation

## 2016-01-12 DIAGNOSIS — E079 Disorder of thyroid, unspecified: Secondary | ICD-10-CM | POA: Diagnosis not present

## 2016-01-12 DIAGNOSIS — M7918 Myalgia, other site: Secondary | ICD-10-CM

## 2016-01-12 DIAGNOSIS — J449 Chronic obstructive pulmonary disease, unspecified: Secondary | ICD-10-CM | POA: Diagnosis not present

## 2016-01-12 DIAGNOSIS — Z5181 Encounter for therapeutic drug level monitoring: Secondary | ICD-10-CM | POA: Diagnosis not present

## 2016-01-12 DIAGNOSIS — Z9981 Dependence on supplemental oxygen: Secondary | ICD-10-CM | POA: Insufficient documentation

## 2016-01-12 DIAGNOSIS — Z79899 Other long term (current) drug therapy: Secondary | ICD-10-CM

## 2016-01-12 DIAGNOSIS — M791 Myalgia: Secondary | ICD-10-CM

## 2016-01-12 DIAGNOSIS — G9619 Other disorders of meninges, not elsewhere classified: Secondary | ICD-10-CM | POA: Insufficient documentation

## 2016-01-12 DIAGNOSIS — M5136 Other intervertebral disc degeneration, lumbar region: Secondary | ICD-10-CM

## 2016-01-12 MED ORDER — HYDROCODONE-ACETAMINOPHEN 10-325 MG PO TABS: 1.0000 | ORAL_TABLET | Freq: Four times a day (QID) | ORAL | Status: DC | PRN

## 2016-01-12 MED ORDER — FENTANYL 50 MCG/HR TD PT72: 50.0000 ug | MEDICATED_PATCH | TRANSDERMAL | Status: DC

## 2016-01-12 NOTE — Progress Notes (Signed)
Subjective:    Patient ID: Jill Phillips, female    DOB: Mar 11, 1956, 60 y.o.   MRN: [id number]  HPI: [name] is a 60 year old female who returns for follow up for chronic pain and medication refill. She states her pain is located on her left side and lower back. She rates her pain 9. Her current exercise regime is walking in her home.  [name] states " she wanted to speak with Dr. [name] before starting physical therapy or receiving injections, she has an appointment with him on  Monday 01/15/16. After her appointment she will call our office she states.   On 08/01/2015 she had Right sided L1-2 and L2-3 transforaminal lumbar interbody fusion with cages, Extension of posterior fusion T12 to L3, Replaced pedicle screws bilaterally L1-L2 , Replaced left sided pedicle screws L-3. Instrumentation T12 to L3 using local bone graft, Vivigen allograft and cancellous chips  Performed by Dr. Morton Stall.  Pain Inventory Average Pain 9 Pain Right Now 9 My pain is aching  In the last 24 hours, has pain interfered with the following? General activity 10 Relation with others 10 Enjoyment of life 10 What TIME of day is your pain at its worst? NA Sleep (in general) Poor  Pain is worse with: walking and laying Pain improves with: NA Relief from Meds: fair  Mobility Do you have any goals in this area?  no  Function Do you have any goals in this area?  no  Neuro/Psych No problems in this area  Prior Studies Any changes since last visit?  no CT/MRI  Physicians involved in your care Any changes since last visit?  no   Family History  Problem Relation Age of Onset  . Kidney disease Mother   . Heart disease Father   . Anuerysm Brother 29    brain  . Heart disease Brother   . Heart disease Sister 68    s/p CABG  . Hypertension Sister    Social History   Social History  . Marital Status: Divorced    Spouse Name: n/a  . Number of Children: 2  . Years of Education: 12+    Occupational History  . disability     back surgeries   Social History Main Topics  . Smoking status: Current Every Day Smoker -- 1.25 packs/day for 45 years    Types: Cigarettes  . Smokeless tobacco: Never Used     Comment: trying to quit  . Alcohol Use: No  . Drug Use: No  . Sexual Activity: Not Asked   Other Topics Concern  . None   Social History Narrative   Lives alone.  One daughter is local, but is getting ready to move to [place], where her children live with their father.  The other daughter lives near [place], [place].   Past Surgical History  Procedure Laterality Date  . Appendectomy    . Abdominal hysterectomy    . Tubal ligation    . Spine surgery      thoracic x 1,  lumbar x 15  . Right hip replacement    . Knee surgeries r knee      arthroscopy- right  . Hammer toe surgery    . Sleep apnea surgery    . Cholecystectomy    . Total knee arthroplasty  05/29/2012    Procedure: TOTAL KNEE ARTHROPLASTY;  Surgeon: Mcarthur Rossetti, MD;  Location: [facility];  Service: Orthopedics;  Laterality: Right;  Right Total  Knee Arthroplasty  . Lumbar laminectomy/decompression microdiscectomy N/A 01/28/2014    Procedure: Minimally Invasive Right  L1-2 Microdiscectomy;  Surgeon: [name], MD;  Location: [facility];  Service: Orthopedics;  Laterality: N/A;  . Joint replacement    . Back surgery      16 back surgeries  . Lumbar fusion N/A 08/01/2015    Procedure: Right sided L1-2 and L2-3 transforaminal lumbar interbody fusion with cages, Extension of posterior fusion T12 to L3, Replaced pedicle screws bilaterally L1-L2 , Replaced left sided pedicle screws L-3. Instrumentation T12 to L3 using local bone graft, Vivigen allograft and cancellous chips;  Surgeon: [name], MD;  Location: [facility];  Service: Orthopedics;  Laterality: N/A;   Past Medical History  Diagnosis Date  . Postlaminectomy syndrome, thoracic region   . Osteoarthrosis, unspecified whether generalized or  localized, lower leg   . Dysthymic disorder   . Calcifying tendinitis of shoulder   . Pain in joint, upper arm   . Chronic pain syndrome   . Lumbago   . Primary localized osteoarthrosis, lower leg   . Hypertension   . Hyperlipidemia   . GERD (gastroesophageal reflux disease)   . Thyroid disease   . Restless leg syndrome   . History of blood transfusion 1980  . Hypothyroidism   . Active smoker   . COPD (chronic obstructive pulmonary disease) ([place])     on nocturnal home o2  . Heart murmur     for years, nothing to be concerned about  . Sleep apnea     s/p surgery- last sleep study 2011- doesnt use oxygen or machine at night as instructed,.   12/2014- Dr Halford Chessman  reports it is negative.  [name] Anxiety   . Herpes genitalia   . PONV (postoperative nausea and vomiting)     gets nauseous  with longer surgery. Difficuty voiding after surgery   BP 117/70 mmHg  Pulse 93  Resp 14  SpO2 95%  Opioid Risk Score:   Fall Risk Score:  `1  Depression screen PHQ 2/9  Depression screen [facility] 2/9 12/07/2015 11/15/2015 10/13/2015 09/06/2015 05/22/2015 02/15/2015 11/24/2014  Decreased Interest 0 0 0 0 0 0 0  Down, Depressed, Hopeless 0 0 - 0 0 0 0  PHQ - 2 Score 0 0 0 0 0 0 0  Altered sleeping - - - - - - 3  Tired, decreased energy - - - - - - 3  Change in appetite - - - - - - 1  Feeling bad or failure about yourself  - - - - - - 0  Trouble concentrating - - - - - - 0  Moving slowly or fidgety/restless - - - - - - 0  Suicidal thoughts - - - - - - 0  PHQ-9 Score - - - - - - 7     Review of Systems  Constitutional: Positive for diaphoresis and appetite change.  All other systems reviewed and are negative.      Objective:   Physical Exam  Constitutional: She is oriented to person, place, and time. She appears well-developed and well-nourished.  HENT:  Head: Normocephalic and atraumatic.  Neck: Normal range of motion. Neck supple.  Cardiovascular: Normal rate and regular rhythm.   Pulmonary/Chest:  Effort normal and breath sounds normal.  Musculoskeletal:  Normal Muscle Bulk and Muscle Testing Reveals: Upper Extremities: Full ROM and Muscle Strength 5/5 Thoracic Paraspinal Tenderness Mainly Left Side T-10 - T-11 Lumbar: No Tenderness Noted Lower Extremities:  Full ROM and Muscle Strength 5/5 Arises from chair with ease Narrow Based Gait  Neurological: She is alert and oriented to person, place, and time.  Skin: Skin is warm and dry.  Psychiatric: She has a normal mood and affect.  Nursing note and vitals reviewed.         Assessment & Plan:  1. Chronic lumbar spine pain/post-lami syndrome: Refilled: Fentanyl 50 MCG one patch every three days #10 and Hydrocodone 10/325 mg one tablet every 6 hours as needed for pain #120. We will continue the opioid monitoring program, this consists of regular clinic visits, examinations, urine drug screen, pill counts as well as use of [place] Controlled Substance Reporting System. 2. Osteoarthritis of the knees bilaterally:No complaints today. Continue with Heat, exercise and voltaren gel.  3. Rotator cuff syndrome/subacromial bursitis: No complaints voiced today. 4. Restless legs syndrome: Continue Requip.Continue to monitor 5. Tobacco Abuse: Encourage Smoking Cessation: Pulmonologist Following 6. Emphysema: Pulmonology Following  20 minutes of face to face patient care time was spent during this visit. All questions were encouraged and answered.   F/u in 1 month

## 2016-01-15 DIAGNOSIS — M940 Chondrocostal junction syndrome [Tietze]: Secondary | ICD-10-CM | POA: Diagnosis not present

## 2016-01-15 DIAGNOSIS — G4734 Idiopathic sleep related nonobstructive alveolar hypoventilation: Secondary | ICD-10-CM | POA: Diagnosis not present

## 2016-01-30 DIAGNOSIS — M940 Chondrocostal junction syndrome [Tietze]: Secondary | ICD-10-CM | POA: Diagnosis not present

## 2016-01-31 ENCOUNTER — Encounter (HOSPITAL_COMMUNITY): Payer: Self-pay | Admitting: *Deleted

## 2016-01-31 ENCOUNTER — Inpatient Hospital Stay (HOSPITAL_COMMUNITY)
Admission: EM | Admit: 2016-01-31 | Discharge: 2016-02-04 | DRG: 201 | Disposition: A | Discharge status: Home / Self Care | Payer: Medicare Other | Attending: Cardiothoracic Surgery | Admitting: Cardiothoracic Surgery

## 2016-01-31 ENCOUNTER — Ambulatory Visit
Admission: RE | Admit: 2016-01-31 | Discharge: 2016-01-31 | Disposition: A | Discharge status: Home / Self Care | Payer: Medicare Other | Source: Ambulatory Visit | Attending: Physical Medicine and Rehabilitation | Admitting: Physical Medicine and Rehabilitation

## 2016-01-31 ENCOUNTER — Emergency Department (HOSPITAL_COMMUNITY): Payer: Medicare Other

## 2016-01-31 ENCOUNTER — Other Ambulatory Visit: Payer: Self-pay | Admitting: Physical Medicine and Rehabilitation

## 2016-01-31 DIAGNOSIS — J9811 Atelectasis: Secondary | ICD-10-CM | POA: Diagnosis not present

## 2016-01-31 DIAGNOSIS — I10 Essential (primary) hypertension: Secondary | ICD-10-CM | POA: Diagnosis not present

## 2016-01-31 DIAGNOSIS — Z96641 Presence of right artificial hip joint: Secondary | ICD-10-CM | POA: Diagnosis not present

## 2016-01-31 DIAGNOSIS — Z6824 Body mass index (BMI) 24.0-24.9, adult: Secondary | ICD-10-CM

## 2016-01-31 DIAGNOSIS — Z4682 Encounter for fitting and adjustment of non-vascular catheter: Secondary | ICD-10-CM | POA: Diagnosis not present

## 2016-01-31 DIAGNOSIS — M961 Postlaminectomy syndrome, not elsewhere classified: Secondary | ICD-10-CM | POA: Diagnosis present

## 2016-01-31 DIAGNOSIS — Z9689 Presence of other specified functional implants: Secondary | ICD-10-CM

## 2016-01-31 DIAGNOSIS — K219 Gastro-esophageal reflux disease without esophagitis: Secondary | ICD-10-CM | POA: Diagnosis present

## 2016-01-31 DIAGNOSIS — Z981 Arthrodesis status: Secondary | ICD-10-CM

## 2016-01-31 DIAGNOSIS — E039 Hypothyroidism, unspecified: Secondary | ICD-10-CM | POA: Diagnosis present

## 2016-01-31 DIAGNOSIS — M7530 Calcific tendinitis of unspecified shoulder: Secondary | ICD-10-CM | POA: Diagnosis present

## 2016-01-31 DIAGNOSIS — Z79891 Long term (current) use of opiate analgesic: Secondary | ICD-10-CM

## 2016-01-31 DIAGNOSIS — Z7952 Long term (current) use of systemic steroids: Secondary | ICD-10-CM

## 2016-01-31 DIAGNOSIS — R0902 Hypoxemia: Secondary | ICD-10-CM | POA: Diagnosis not present

## 2016-01-31 DIAGNOSIS — F17210 Nicotine dependence, cigarettes, uncomplicated: Secondary | ICD-10-CM | POA: Diagnosis present

## 2016-01-31 DIAGNOSIS — Z79899 Other long term (current) drug therapy: Secondary | ICD-10-CM

## 2016-01-31 DIAGNOSIS — G4730 Sleep apnea, unspecified: Secondary | ICD-10-CM | POA: Diagnosis present

## 2016-01-31 DIAGNOSIS — J95811 Postprocedural pneumothorax: Principal | ICD-10-CM | POA: Diagnosis present

## 2016-01-31 DIAGNOSIS — F419 Anxiety disorder, unspecified: Secondary | ICD-10-CM | POA: Diagnosis present

## 2016-01-31 DIAGNOSIS — J449 Chronic obstructive pulmonary disease, unspecified: Secondary | ICD-10-CM | POA: Diagnosis present

## 2016-01-31 DIAGNOSIS — M1712 Unilateral primary osteoarthritis, left knee: Secondary | ICD-10-CM | POA: Diagnosis present

## 2016-01-31 DIAGNOSIS — R0602 Shortness of breath: Secondary | ICD-10-CM

## 2016-01-31 DIAGNOSIS — J939 Pneumothorax, unspecified: Secondary | ICD-10-CM | POA: Diagnosis present

## 2016-01-31 DIAGNOSIS — Z96651 Presence of right artificial knee joint: Secondary | ICD-10-CM | POA: Diagnosis not present

## 2016-01-31 DIAGNOSIS — Z7951 Long term (current) use of inhaled steroids: Secondary | ICD-10-CM

## 2016-01-31 DIAGNOSIS — E785 Hyperlipidemia, unspecified: Secondary | ICD-10-CM | POA: Diagnosis not present

## 2016-01-31 DIAGNOSIS — M419 Scoliosis, unspecified: Secondary | ICD-10-CM | POA: Diagnosis present

## 2016-01-31 DIAGNOSIS — Z9049 Acquired absence of other specified parts of digestive tract: Secondary | ICD-10-CM

## 2016-01-31 DIAGNOSIS — F341 Dysthymic disorder: Secondary | ICD-10-CM | POA: Diagnosis present

## 2016-01-31 DIAGNOSIS — G2581 Restless legs syndrome: Secondary | ICD-10-CM | POA: Diagnosis not present

## 2016-01-31 DIAGNOSIS — G894 Chronic pain syndrome: Secondary | ICD-10-CM | POA: Diagnosis not present

## 2016-01-31 DIAGNOSIS — J9311 Primary spontaneous pneumothorax: Secondary | ICD-10-CM | POA: Diagnosis not present

## 2016-01-31 HISTORY — DX: Other chronic pain: G89.29

## 2016-01-31 HISTORY — DX: Emphysema, unspecified: J43.9

## 2016-01-31 HISTORY — DX: Headache, unspecified: R51.9

## 2016-01-31 HISTORY — DX: Pneumothorax, unspecified: J93.9

## 2016-01-31 HISTORY — DX: Headache: R51

## 2016-01-31 HISTORY — DX: Dorsalgia, unspecified: M54.9

## 2016-01-31 LAB — BASIC METABOLIC PANEL
Anion gap: 9 (ref 5–15)
BUN: 17 mg/dL (ref 6–20)
CO2: 26 mmol/L (ref 22–32)
Calcium: 9.1 mg/dL (ref 8.9–10.3)
Chloride: 103 mmol/L (ref 101–111)
Creatinine, Ser: 1.05 mg/dL — ABNORMAL HIGH (ref 0.44–1.00)
GFR calc Af Amer: >60 mL/min (ref >60)
GFR calc non Af Amer: 57 mL/min — ABNORMAL LOW (ref >60)
Glucose, Bld: 110 mg/dL — ABNORMAL HIGH (ref 65–99)
Potassium: 3.4 mmol/L — ABNORMAL LOW (ref 3.5–5.1)
Sodium: 138 mmol/L (ref 135–145)

## 2016-01-31 LAB — CBC WITH DIFFERENTIAL/PLATELET
Basophils Absolute: 0 10*3/uL (ref 0.0–0.1)
Basophils Relative: 0 %
Eosinophils Absolute: 0 10*3/uL (ref 0.0–0.7)
Eosinophils Relative: 0 %
HCT: 36.2 % (ref 36.0–46.0)
Hemoglobin: 11.4 g/dL — ABNORMAL LOW (ref 12.0–15.0)
Lymphocytes Relative: 19 %
Lymphs Abs: 1.6 10*3/uL (ref 0.7–4.0)
MCH: 29.4 pg (ref 26.0–34.0)
MCHC: 31.5 g/dL (ref 30.0–36.0)
MCV: 93.3 fL (ref 78.0–100.0)
Monocytes Absolute: 0.5 10*3/uL (ref 0.1–1.0)
Monocytes Relative: 6 %
Neutro Abs: 6.7 10*3/uL (ref 1.7–7.7)
Neutrophils Relative %: 75 %
Platelets: 245 10*3/uL (ref 150–400)
RBC: 3.88 MIL/uL (ref 3.87–5.11)
RDW: 15.8 % — ABNORMAL HIGH (ref 11.5–15.5)
WBC: 8.8 10*3/uL (ref 4.0–10.5)

## 2016-01-31 LAB — I-STAT TROPONIN, ED: Troponin i, poc: 0 ng/mL (ref 0.00–0.08)

## 2016-01-31 MED ORDER — NALOXONE HCL 0.4 MG/ML IJ SOLN: 0.4000 mg | INTRAMUSCULAR | Status: DC | PRN

## 2016-01-31 MED ORDER — DIPHENHYDRAMINE HCL 50 MG/ML IJ SOLN: 12.5000 mg | Freq: Four times a day (QID) | INTRAMUSCULAR | Status: DC | PRN

## 2016-01-31 MED ORDER — MIDAZOLAM HCL 2 MG/2ML IJ SOLN
4.0000 mg | Freq: Once | INTRAMUSCULAR | Status: DC
  Filled 2016-01-31: qty 4

## 2016-01-31 MED ORDER — MIDAZOLAM HCL 2 MG/2ML IJ SOLN
INTRAMUSCULAR | Status: AC
  Administered 2016-01-31: 2 mg via INTRAVENOUS
  Filled 2016-01-31: qty 4

## 2016-01-31 MED ORDER — DEXTROSE-NACL 5-0.45 % IV SOLN
INTRAVENOUS | Status: DC
  Administered 2016-01-31 – 2016-02-01 (×2): via INTRAVENOUS

## 2016-01-31 MED ORDER — DOCUSATE SODIUM 100 MG PO CAPS
100.0000 mg | ORAL_CAPSULE | Freq: Two times a day (BID) | ORAL | Status: DC
  Administered 2016-01-31 – 2016-02-04 (×8): 100 mg via ORAL
  Filled 2016-01-31 (×8): qty 1

## 2016-01-31 MED ORDER — FENTANYL 40 MCG/ML IV SOLN
INTRAVENOUS | Status: DC
  Administered 2016-01-31: 19:00:00 via INTRAVENOUS
  Administered 2016-02-01: 165 ug via INTRAVENOUS
  Administered 2016-02-01: 180 ug via INTRAVENOUS
  Administered 2016-02-01 (×2): 30 ug via INTRAVENOUS
  Administered 2016-02-01 (×2): 120 ug via INTRAVENOUS
  Administered 2016-02-01: 23:00:00 via INTRAVENOUS
  Administered 2016-02-02: 105 ug via INTRAVENOUS
  Administered 2016-02-02: via INTRAVENOUS
  Administered 2016-02-02: 75 ug via INTRAVENOUS
  Administered 2016-02-02: 90 ug via INTRAVENOUS
  Administered 2016-02-02: 210 ug via INTRAVENOUS
  Administered 2016-02-02: 180 ug via INTRAVENOUS
  Administered 2016-02-03: 135 ug via INTRAVENOUS
  Administered 2016-02-03: 75 ug via INTRAVENOUS
  Administered 2016-02-03: 180 ug via INTRAVENOUS
  Filled 2016-01-31 (×3): qty 25

## 2016-01-31 MED ORDER — GABAPENTIN 300 MG PO CAPS
300.0000 mg | ORAL_CAPSULE | Freq: Two times a day (BID) | ORAL | Status: DC
  Administered 2016-01-31 – 2016-02-04 (×8): 300 mg via ORAL
  Filled 2016-01-31 (×8): qty 1

## 2016-01-31 MED ORDER — ASPIRIN EC 81 MG PO TBEC
81.0000 mg | DELAYED_RELEASE_TABLET | Freq: Every day | ORAL | Status: DC
  Administered 2016-02-01 – 2016-02-04 (×4): 81 mg via ORAL
  Filled 2016-01-31 (×4): qty 1

## 2016-01-31 MED ORDER — FENTANYL CITRATE (PF) 100 MCG/2ML IJ SOLN
INTRAMUSCULAR | Status: AC
  Administered 2016-01-31: 100 ug
  Filled 2016-01-31: qty 2

## 2016-01-31 MED ORDER — LIDOCAINE HCL (PF) 1 % IJ SOLN
INTRAMUSCULAR | Status: AC
  Administered 2016-01-31: 15 mL
  Filled 2016-01-31: qty 30

## 2016-01-31 MED ORDER — ALBUTEROL SULFATE (2.5 MG/3ML) 0.083% IN NEBU
2.5000 mg | INHALATION_SOLUTION | Freq: Four times a day (QID) | RESPIRATORY_TRACT | Status: DC
  Administered 2016-01-31 – 2016-02-02 (×6): 2.5 mg via RESPIRATORY_TRACT
  Filled 2016-01-31 (×8): qty 3

## 2016-01-31 MED ORDER — SODIUM CHLORIDE 0.9% FLUSH: 9.0000 mL | INTRAVENOUS | Status: DC | PRN

## 2016-01-31 MED ORDER — CARISOPRODOL 350 MG PO TABS
350.0000 mg | ORAL_TABLET | Freq: Three times a day (TID) | ORAL | Status: DC
  Administered 2016-01-31 – 2016-02-04 (×11): 350 mg via ORAL
  Filled 2016-01-31 (×11): qty 1

## 2016-01-31 MED ORDER — ONDANSETRON HCL 4 MG PO TABS: 4.0000 mg | ORAL_TABLET | Freq: Four times a day (QID) | ORAL | Status: DC | PRN

## 2016-01-31 MED ORDER — DIPHENHYDRAMINE HCL 12.5 MG/5ML PO ELIX: 12.5000 mg | ORAL_SOLUTION | Freq: Four times a day (QID) | ORAL | Status: DC | PRN

## 2016-01-31 MED ORDER — PRAVASTATIN SODIUM 40 MG PO TABS
40.0000 mg | ORAL_TABLET | Freq: Every day | ORAL | Status: DC
  Administered 2016-02-01 – 2016-02-04 (×4): 40 mg via ORAL
  Filled 2016-01-31: qty 2
  Filled 2016-01-31 (×3): qty 1

## 2016-01-31 MED ORDER — ACETAMINOPHEN 325 MG PO TABS
650.0000 mg | ORAL_TABLET | Freq: Four times a day (QID) | ORAL | Status: DC | PRN
  Administered 2016-02-03 (×2): 650 mg via ORAL
  Filled 2016-01-31 (×2): qty 2

## 2016-01-31 MED ORDER — CLONAZEPAM 0.5 MG PO TABS
2.0000 mg | ORAL_TABLET | Freq: Two times a day (BID) | ORAL | Status: DC | PRN
  Administered 2016-01-31 – 2016-02-04 (×6): 2 mg via ORAL
  Filled 2016-01-31 (×6): qty 4

## 2016-01-31 MED ORDER — PANTOPRAZOLE SODIUM 40 MG PO TBEC
40.0000 mg | DELAYED_RELEASE_TABLET | Freq: Every day | ORAL | Status: DC
  Administered 2016-02-01 – 2016-02-04 (×4): 40 mg via ORAL
  Filled 2016-01-31 (×4): qty 1

## 2016-01-31 MED ORDER — BENAZEPRIL HCL 40 MG PO TABS
40.0000 mg | ORAL_TABLET | Freq: Every day | ORAL | Status: DC
  Administered 2016-02-01 – 2016-02-04 (×4): 40 mg via ORAL
  Filled 2016-01-31 (×4): qty 1

## 2016-01-31 MED ORDER — ONDANSETRON HCL 4 MG/2ML IJ SOLN: 4.0000 mg | Freq: Four times a day (QID) | INTRAMUSCULAR | Status: DC | PRN

## 2016-01-31 MED ORDER — FENTANYL CITRATE (PF) 100 MCG/2ML IJ SOLN
50.0000 ug | INTRAMUSCULAR | Status: DC | PRN
  Filled 2016-01-31: qty 2

## 2016-01-31 MED ORDER — ACETAMINOPHEN 650 MG RE SUPP: 650.0000 mg | Freq: Four times a day (QID) | RECTAL | Status: DC | PRN

## 2016-01-31 MED ORDER — HYDROCODONE-ACETAMINOPHEN 5-325 MG PO TABS
1.0000 | ORAL_TABLET | ORAL | Status: DC | PRN
  Administered 2016-01-31 – 2016-02-01 (×2): 2 via ORAL
  Administered 2016-02-01: 1 via ORAL
  Administered 2016-02-01 – 2016-02-04 (×10): 2 via ORAL
  Filled 2016-01-31 (×11): qty 2
  Filled 2016-01-31: qty 1
  Filled 2016-01-31: qty 2

## 2016-01-31 MED ORDER — SORBITOL 70 % SOLN
30.0000 mL | Freq: Every day | Status: DC | PRN
  Filled 2016-01-31: qty 30

## 2016-01-31 MED ORDER — FLUTICASONE PROPIONATE 50 MCG/ACT NA SUSP
1.0000 | Freq: Every day | NASAL | Status: DC
  Administered 2016-02-01 – 2016-02-04 (×4): 1 via NASAL
  Filled 2016-01-31: qty 16

## 2016-01-31 MED ORDER — QUETIAPINE FUMARATE 25 MG PO TABS
100.0000 mg | ORAL_TABLET | Freq: Every day | ORAL | Status: DC
  Administered 2016-02-01 – 2016-02-03 (×3): 100 mg via ORAL
  Filled 2016-01-31 (×3): qty 4

## 2016-01-31 MED ORDER — AMLODIPINE BESYLATE 5 MG PO TABS
5.0000 mg | ORAL_TABLET | Freq: Every day | ORAL | Status: DC
  Administered 2016-02-01 – 2016-02-04 (×4): 5 mg via ORAL
  Filled 2016-01-31 (×4): qty 1

## 2016-01-31 MED ORDER — TIOTROPIUM BROMIDE MONOHYDRATE 18 MCG IN CAPS
18.0000 ug | ORAL_CAPSULE | Freq: Every day | RESPIRATORY_TRACT | Status: DC
  Administered 2016-02-01 – 2016-02-04 (×4): 18 ug via RESPIRATORY_TRACT
  Filled 2016-01-31: qty 5

## 2016-01-31 MED ORDER — VENLAFAXINE HCL ER 150 MG PO CP24
150.0000 mg | ORAL_CAPSULE | Freq: Every day | ORAL | Status: DC
  Administered 2016-02-01 – 2016-02-04 (×4): 150 mg via ORAL
  Filled 2016-01-31 (×2): qty 2
  Filled 2016-01-31 (×2): qty 1
  Filled 2016-01-31: qty 2
  Filled 2016-01-31 (×2): qty 1

## 2016-01-31 NOTE — ED Notes (Signed)
MD at bedside. 

## 2016-01-31 NOTE — ED Notes (Signed)
Fentanyl, 25 mcg IV

## 2016-01-31 NOTE — ED Notes (Signed)
Fentanyl 54mcg IV

## 2016-01-31 NOTE — ED Provider Notes (Signed)
CSN: [id number]     Arrival date & time 01/31/16  1355 History   First MD Initiated Contact with Patient 01/31/16 1409     Chief Complaint  Patient presents with  . Chest Injury     (Consider location/radiation/quality/duration/timing/severity/associated sxs/prior Treatment) HPI Comments: 60 year old female with extensive past medical history listed below who presents with chest pain and shortness of breath. Yesterday, the patient followed up at her orthopedics office, where she had some local injections in her left thoracic back for pain control. After she left the office, she started having central, constant chest pain as well as shortness of breath. Her symptoms have persisted over night into today. She contacted the office and had a chest x-ray which showed a pneumothorax. She was brought in by EMS. She denies any anticoagulant use. No other complaints.  The history is provided by the patient.    Past Medical History  Diagnosis Date  . Postlaminectomy syndrome, thoracic region   . Osteoarthrosis, unspecified whether generalized or localized, lower leg   . Dysthymic disorder   . Calcifying tendinitis of shoulder   . Pain in joint, upper arm   . Chronic pain syndrome   . Lumbago   . Primary localized osteoarthrosis, lower leg   . Hypertension   . Hyperlipidemia   . GERD (gastroesophageal reflux disease)   . Thyroid disease   . Restless leg syndrome   . History of blood transfusion 1980  . Hypothyroidism   . Active smoker   . COPD (chronic obstructive pulmonary disease) ([place])     on nocturnal home o2  . Heart murmur     for years, nothing to be concerned about  . Sleep apnea     s/p surgery- last sleep study 2011- doesnt use oxygen or machine at night as instructed,.   12/2014- Dr Halford Chessman  reports it is negative.  [name] Anxiety   . Herpes genitalia   . PONV (postoperative nausea and vomiting)     gets nauseous  with longer surgery. Difficuty voiding after surgery   Past Surgical  History  Procedure Laterality Date  . Appendectomy    . Abdominal hysterectomy    . Tubal ligation    . Spine surgery      thoracic x 1,  lumbar x 15  . Right hip replacement    . Knee surgeries r knee      arthroscopy- right  . Hammer toe surgery    . Sleep apnea surgery    . Cholecystectomy    . Total knee arthroplasty  05/29/2012    Procedure: TOTAL KNEE ARTHROPLASTY;  Surgeon: Mcarthur Rossetti, MD;  Location: [facility];  Service: Orthopedics;  Laterality: Right;  Right Total Knee Arthroplasty  . Lumbar laminectomy/decompression microdiscectomy N/A 01/28/2014    Procedure: Minimally Invasive Right  L1-2 Microdiscectomy;  Surgeon: [name], MD;  Location: [facility];  Service: Orthopedics;  Laterality: N/A;  . Joint replacement    . Back surgery      16 back surgeries  . Lumbar fusion N/A 08/01/2015    Procedure: Right sided L1-2 and L2-3 transforaminal lumbar interbody fusion with cages, Extension of posterior fusion T12 to L3, Replaced pedicle screws bilaterally L1-L2 , Replaced left sided pedicle screws L-3. Instrumentation T12 to L3 using local bone graft, Vivigen allograft and cancellous chips;  Surgeon: [name], MD;  Location: [facility];  Service: Orthopedics;  Laterality: N/A;   Family History  Problem Relation Age of Onset  .  Kidney disease Mother   . Heart disease Father   . Anuerysm Brother 29    brain  . Heart disease Brother   . Heart disease Sister 36    s/p CABG  . Hypertension Sister    Social History  Substance Use Topics  . Smoking status: Current Every Day Smoker -- 1.25 packs/day for 45 years    Types: Cigarettes  . Smokeless tobacco: Never Used     Comment: trying to quit  . Alcohol Use: No   OB History    No data available     Review of Systems 10 Systems reviewed and are negative for acute change except as noted in the HPI.    Allergies  Amoxicillin; Chlorzoxazone; Codeine; Darvocet; Dilaudid; Flagyl; Keflex; Morphine and related;  Nitrofurantoin monohyd macro; Percocet; and Sulfa antibiotics  Home Medications   Prior to Admission medications   Medication Sig Start Date End Date Taking? Authorizing Provider  acetaminophen (TYLENOL) 500 MG tablet Take 1,000 mg by mouth every 6 (six) hours as needed for headache.     Historical Provider, MD  albuterol (PROVENTIL HFA;VENTOLIN HFA) 108 (90 BASE) MCG/ACT inhaler Inhale 1 puff into the lungs every 6 (six) hours as needed for wheezing or shortness of breath. 12/21/14   Chesley Mires, MD  amLODipine (NORVASC) 10 MG tablet Take 1 tablet (10 mg total) by mouth daily. 10/24/15 10/23/16  Juluis Mire, MD  benazepril (LOTENSIN) 40 MG tablet TAKE 1 TABLET BY MOUTH DAILY. 10/27/15   Florinda Marker, MD  carisoprodol (SOMA) 350 MG tablet Take 1 tablet (350 mg total) by mouth 3 (three) times daily. 12/13/15   Meredith Staggers, MD  clonazePAM (KLONOPIN) 2 MG tablet Take 1 tablet (2 mg total) by mouth 2 (two) times daily as needed. 01/05/16   Meredith Staggers, MD  cyclobenzaprine (FLEXERIL) 10 MG tablet Take 1 tablet (10 mg total) by mouth at bedtime. 12/07/15 12/06/16  Shela Leff, MD  fentaNYL (DURAGESIC - DOSED MCG/HR) 50 MCG/HR Place 1 patch (50 mcg total) onto the skin every 3 (three) days. 01/12/16   Bayard Hugger, NP  ferrous gluconate (FERGON) 324 MG tablet Take 1 tablet (324 mg total) by mouth 3 (three) times daily with meals. 09/07/15   Alexa Angela Burke, MD  fluticasone (FLONASE) 50 MCG/ACT nasal spray Place 1 spray into both nostrils daily. 11/22/15 11/16/16  Alexa Angela Burke, MD  gabapentin (NEURONTIN) 300 MG capsule TAKE 1 CAPSULE BY MOUTH 2 TIMES DAILY. 10/13/15   Meredith Staggers, MD  guaiFENesin-dextromethorphan (ROBITUSSIN DM) 100-10 MG/5ML syrup Take 5 mLs by mouth every 4 (four) hours as needed for cough. 10/13/15   Ejiroghene Arlyce Dice, MD  hydrochlorothiazide (MICROZIDE) 12.5 MG capsule Take 1 capsule (12.5 mg total) by mouth daily. 10/13/15   Ejiroghene Arlyce Dice, MD   HYDROcodone-acetaminophen (NORCO) 10-325 MG tablet Take 1 tablet by mouth every 6 (six) hours as needed (breakthrough pain). 01/12/16   Bayard Hugger, NP  levothyroxine (SYNTHROID, LEVOTHROID) 75 MCG tablet Take 1 tablet (75 mcg total) by mouth daily before breakfast. 09/07/15   Alexa Angela Burke, MD  lidocaine (LIDODERM) 5 % Place 1 patch onto the skin daily. Remove & Discard patch within 12 hours or as directed by MD 12/11/15   Florinda Marker, MD  meclizine (ANTIVERT) 25 MG tablet TAKE 1 TABLET BY MOUTH 2 TIMES DAILY AS NEEDED FOR NAUSEA. 12/08/15   Alexa Angela Burke, MD  methylPREDNISolone (MEDROL DOSEPAK) 4 MG TBPK tablet Take  as directed 12/20/15   Meredith Staggers, MD  naloxone [facility]) 0.4 MG/ML injection Inject 1 mL (0.4 mg total) into the vein as needed. 09/06/15   Alexa Angela Burke, MD  omeprazole (PRILOSEC) 20 MG capsule TAKE 1 CAPSULE BY MOUTH 2 TIMES DAILY BEFORE A MEAL. 08/17/15   Florinda Marker, MD  pravastatin (PRAVACHOL) 40 MG tablet TAKE 1 TABLET BY MOUTH DAILY. Patient taking differently: TAKE 1 TABLET BY MOUTH DAILY AT BEDTIME 01/09/15   Alexa Angela Burke, MD  Psyllium (VEGETABLE LAXATIVE PO) Take 2 tablets by mouth at bedtime. Reported on 10/24/2015    Historical Provider, MD  QUEtiapine (SEROQUEL) 50 MG tablet Take 100 mg by mouth at bedtime.  08/05/14   Historical Provider, MD  rOPINIRole (REQUIP) 0.5 MG tablet TAKE 1 TABLET BY MOUTH AT BEDTIME FOR 1 WEEK THEN INCREASE TO 2 TABLETS AT BEDTIME. (STOP PRIMIDONE) 01/04/16   Meredith Staggers, MD  senna-docusate (SENOKOT-S) 8.6-50 MG per tablet Take 2 tablets by mouth at bedtime as needed for mild constipation. 05/22/15   Alexa Angela Burke, MD  SPIRIVA HANDIHALER 18 MCG inhalation capsule PLACE 1 CAPSULE INTO INHALER AND INHALE DAILY. 10/13/15   Chesley Mires, MD  valACYclovir (VALTREX) 500 MG tablet TAKE 1 TABLET BY MOUTH DAILY. 05/29/15   Florinda Marker, MD  venlafaxine XR (EFFEXOR-XR) 150 MG 24 hr capsule TAKE 2 CAPSULES (300 MG TOTAL) BY MOUTH DAILY WITH  BREAKFAST. Patient taking differently: TAKE 1 CAPSULE (150 MG) BY MOUTH TWICE DAILY -BREAKFAST AND SUPPER 06/20/15   Alexa Angela Burke, MD  VOLTAREN 1 % GEL APPLY 2 GRAMS TO AFFECTED AREA 3 TIMES A DAY AS NEEDED FOR PAIN. 12/07/15   Meredith Staggers, MD   BP 100/85 mmHg  Pulse 88  Temp(Src) 98.5 F (36.9 C) (Oral)  Resp 19  Ht 5\' 4"  (1.626 m)  Wt 144 lb (65.318 kg)  BMI 24.71 kg/m2  SpO2 94% Physical Exam  Constitutional: She is oriented to person, place, and time. She appears well-developed and well-nourished. No distress.  uncomfortable  HENT:  Head: Normocephalic and atraumatic.  Eyes: Conjunctivae are normal.  Neck: Neck supple.  Cardiovascular: Normal rate, regular rhythm and normal heart sounds.   No murmur heard. Pulmonary/Chest:  Mild dysnpea, severely diminished BS L lung, normal R lung  Abdominal: Soft. Bowel sounds are normal. She exhibits no distension. There is no tenderness.  Musculoskeletal: She exhibits no edema.  Neurological: She is alert and oriented to person, place, and time.  Fluent speech  Skin: Skin is warm and dry.  2 injection sites on L lateral thoracic back, no erythema or tenderness  Psychiatric: She has a normal mood and affect. Judgment normal.  Nursing note and vitals reviewed.   ED Course  .Sedation Date/Time: 01/31/2016 4:02 PM Performed by: Sharlett Iles Authorized by: Sharlett Iles  Consent:    Consent obtained:  Verbal   Consent given by:  Patient Indications:    Sedation purpose:  Chest tube placement   Procedure necessitating sedation performed by:  Different physician   Intended level of sedation:  Moderate (conscious sedation) Pre-sedation assessment:    NPO status caution: urgency dictates proceeding with non-ideal NPO status     ASA classification: class 2 - patient with mild systemic disease     Neck mobility: normal     Mouth opening:  3 or more finger widths   Thyromental distance:  4 finger widths    Pre-sedation assessments completed and reviewed: airway  patency, cardiovascular function and mental status     Pre-sedation assessments completed and reviewed: nausea/vomiting status not reviewed   Immediate pre-procedure details:    Reassessment: Patient reassessed immediately prior to procedure     Reviewed: vital signs     Verified: bag valve mask available, emergency equipment available, IV patency confirmed, oxygen available and suction available   Procedure details (see MAR for exact dosages):    Preoxygenation:  Nasal cannula   Sedation:  Midazolam   Analgesia:  Fentanyl   Intra-procedure monitoring:  Blood pressure monitoring, cardiac monitor, continuous capnometry and continuous pulse oximetry   Intra-procedure events: none   Post-procedure details:    Recovery: Patient returned to pre-procedure baseline     Patient is stable for discharge or admission: Yes     Patient tolerance:  Tolerated well, no immediate complications   CRITICAL CARE Performed by: Wenda Overland Orazio Weller   Total critical care time: 30 minutes  Critical care time was exclusive of separately billable procedures and treating other patients.  Critical care was necessary to treat or prevent imminent or life-threatening deterioration.  Critical care was time spent personally by me on the following activities: development of treatment plan with patient and/or surrogate as well as nursing, discussions with consultants, evaluation of patient's response to treatment, examination of patient, obtaining history from patient or surrogate, ordering and performing treatments and interventions, ordering and review of laboratory studies, ordering and review of radiographic studies, pulse oximetry and re-evaluation of patient's condition.  (including critical care time) Labs Review Labs Reviewed  BASIC METABOLIC PANEL - Abnormal; Notable for the following:    Potassium 3.4 (*)    Glucose, Bld 110 (*)    Creatinine, Ser  1.05 (*)    GFR calc non Af Amer 57 (*)    All other components within normal limits  CBC WITH DIFFERENTIAL/PLATELET - Abnormal; Notable for the following:    Hemoglobin 11.4 (*)    RDW 15.8 (*)    All other components within normal limits  I-STAT TROPOININ, ED    Imaging Review Dg Chest 2 View  01/31/2016  CLINICAL DATA:  Chest pain and shortness of breath for 1 day. Status post intercostal nerve block. EXAM: CHEST  2 VIEW COMPARISON:  12/06/2015 FINDINGS: A moderate to large approximately 50% left pneumothorax is seen which is new since previous study. No right-sided pneumothorax. Both lungs are clear. Heart size is within normal limits. Lower thoracic and lumbar spine fusion hardware again noted. IMPRESSION: Moderate to large approximately 50% left pneumothorax. Critical Value/emergent results were called by telephone at the time of interpretation on 01/31/2016 at 12:35 pm to Dr. Magnus Sinning , who verbally acknowledged these results. Electronically Signed   By: Earle Gell M.D.   On: 01/31/2016 12:39   I have personally reviewed and evaluated these images and lab results as part of my medical decision-making.   EKG Interpretation None     Medications  fentaNYL (SUBLIMAZE) injection 50 mcg (not administered)  midazolam (VERSED) injection 4 mg (not administered)  HYDROcodone-acetaminophen (NORCO/VICODIN) 5-325 MG per tablet 1-2 tablet (not administered)  fentaNYL (SUBLIMAZE) 100 MCG/2ML injection (not administered)  lidocaine (PF) (XYLOCAINE) 1 % injection (not administered)  midazolam (VERSED) 2 MG/2ML injection (2 mg Intravenous Given 01/31/16 1526)    MDM   Final diagnoses:  None   Patient presents for evaluation of left pneumothorax after receiving back injections yesterday at orthopedics clinic. On EMS arrival, the patient was hypoxic in the 80s and was  placed on O2 nasal cannula. On my examination, the patient was 94% on 2 L, increased to 4L. BP 115/73, HR 93. Severely  diminished L lung sounds. I reviewed CXR which shows large pneumothorax, contacted thoracic surgery and discussed with Ryan with Dr. Prescott Gum. Dr. Prescott Gum placed pigtail drain under moderate sedation; see procedure note for details. Pt will be admitted for chest tube management.  Sharlett Iles, MD 01/31/16 [phone]

## 2016-01-31 NOTE — ED Notes (Signed)
Versed 1 mg IV  Fentanyl 25 cg IV

## 2016-01-31 NOTE — ED Notes (Signed)
Versed 1mg

## 2016-01-31 NOTE — Progress Notes (Signed)
  Subjective: Large left pneumothorax after sub costal injectuion with pain medicine 17 F chest tube placed in ED - low sats on 4 L Heavy smoker Admit for chest tube therapy Objective: Vital signs in last 24 hours: Temp:  [98.5 F (36.9 C)] 98.5 F (36.9 C) (05/31 1402) Pulse Rate:  [80-100] 94 (05/31 1558) Cardiac Rhythm:  [-] Normal sinus rhythm (05/31 1601) Resp:  [15-19] 16 (05/31 1558) BP: (99-121)/(73-85) 119/76 mmHg (05/31 1558) SpO2:  [92 %-97 %] 94 % (05/31 1558) Weight:  [144 lb (65.318 kg)] 144 lb (65.318 kg) (05/31 1402)  Hemodynamic parameters for last 24 hours:    Intake/Output from previous day:   Intake/Output this shift:    Min air leak from chest tube Breath sounds improv ed after tube placed  Lab Results:  Recent Labs  01/31/16 1508  WBC 8.8  HGB 11.4*  HCT 36.2  PLT 245   BMET:  Recent Labs  01/31/16 1508  NA 138  K 3.4*  CL 103  CO2 26  GLUCOSE 110*  BUN 17  CREATININE 1.05*  CALCIUM 9.1    PT/INR: No results for input(s): LABPROT, INR in the last 72 hours. ABG    Component Value Date/Time   TCO2 27 01/17/2010 1142   CBG (last 3)  No results for input(s): GLUCAP in the last 72 hours.  Assessment/Plan: S/P   admit 2 west   LOS: 0 days    Tharon Aquas Trigt III 01/31/2016

## 2016-01-31 NOTE — H&P (Signed)
[address]       [place] [address]             [phone]        Conita C Hershey [place] Medical Record [id number] Date of Birth: [birth year]  Referring: No ref. provider found Primary Care: Florinda Marker, MD  Chief Complaint:    Chief Complaint  Patient presents with  . Chest Injury  patient examined, chest x-ray personally reviewed  History of Present Illness:     60 year old Caucasian female with heavy smoking history and COPD presents with spontaneous pneumothorax which falls at intercostal nerve block at a community orthopedic office yesterday. The patient has chronic pain syndrome and is status post 18 back operations. Following the subcostal block on the left side she develops shortness of breath and chest pain. Chest x-ray taken today shows a large left pneumothorax. Clinically she does require oxygen since reported to the emergency department-4 L per minute period There is no history of previous pneumothorax   patient had a right VATS at an outside hospital for exposure of the lower thoracic spine for spine surgery several years ago.  After informed consent and a proper timeout with IV conscious sedation monitored patient had a left chest tube placed via anterior approach because of her scoliosis spine and malalignment of her ribs laterally.followup chest x-ray showed reexpansion of the left lung. The patient be been for chest tube treatment and observation.   Current Activity/ Functional Status: Patient is stable because of chronic back pain and multiple back operations   Zubrod Score: At the time of surgery this patient's most appropriate activity status/level should be described as: []     0    Normal activity, no symptoms []     1    Restricted in physical strenuous activity but ambulatory, able to do out light work []     2    Ambulatory and capable of self care, unable to do work activities, up and about                 more than 50%  Of the  time                            [x]     3    Only limited self care, in bed greater than 50% of waking hours []     4    Completely disabled, no self care, confined to bed or chair []     5    Moribund  Past Medical History  Diagnosis Date  . Postlaminectomy syndrome, thoracic region   . Osteoarthrosis, unspecified whether generalized or localized, lower leg   . Dysthymic disorder   . Calcifying tendinitis of shoulder   . Pain in joint, upper arm   . Chronic pain syndrome   . Lumbago   . Primary localized osteoarthrosis, lower leg   . Hypertension   . Hyperlipidemia   . GERD (gastroesophageal reflux disease)   . Thyroid disease   . Restless leg syndrome   . History of blood transfusion 1980  . Hypothyroidism   . Active smoker   . COPD (chronic obstructive pulmonary disease) ([place])     on nocturnal home o2  . Heart murmur     for years, nothing to be concerned about  . Sleep apnea     s/p surgery- last sleep study 2011- doesnt use oxygen or machine at  night as instructed,.   12/2014- Dr Halford Chessman  reports it is negative.  [name] Anxiety   . Herpes genitalia   . PONV (postoperative nausea and vomiting)     gets nauseous  with longer surgery. Difficuty voiding after surgery    Past Surgical History  Procedure Laterality Date  . Appendectomy    . Abdominal hysterectomy    . Tubal ligation    . Spine surgery      thoracic x 1,  lumbar x 15  . Right hip replacement    . Knee surgeries r knee      arthroscopy- right  . Hammer toe surgery    . Sleep apnea surgery    . Cholecystectomy    . Total knee arthroplasty  05/29/2012    Procedure: TOTAL KNEE ARTHROPLASTY;  Surgeon: Mcarthur Rossetti, MD;  Location: [facility];  Service: Orthopedics;  Laterality: Right;  Right Total Knee Arthroplasty  . Lumbar laminectomy/decompression microdiscectomy N/A 01/28/2014    Procedure: Minimally Invasive Right  L1-2 Microdiscectomy;  Surgeon: [name], MD;  Location: [facility];  Service: Orthopedics;   Laterality: N/A;  . Joint replacement    . Back surgery      16 back surgeries  . Lumbar fusion N/A 08/01/2015    Procedure: Right sided L1-2 and L2-3 transforaminal lumbar interbody fusion with cages, Extension of posterior fusion T12 to L3, Replaced pedicle screws bilaterally L1-L2 , Replaced left sided pedicle screws L-3. Instrumentation T12 to L3 using local bone graft, Vivigen allograft and cancellous chips;  Surgeon: [name], MD;  Location: [facility];  Service: Orthopedics;  Laterality: N/A;    History  Smoking status  . Current Every Day Smoker -- 1.25 packs/day for 45 years  . Types: Cigarettes  Smokeless tobacco  . Never Used    Comment: trying to quit    History  Alcohol Use No    Social History   Social History  . Marital Status: Divorced    Spouse Name: n/a  . Number of Children: 2  . Years of Education: 12+   Occupational History  . disability     back surgeries   Social History Main Topics  . Smoking status: Current Every Day Smoker -- 1.25 packs/day for 45 years    Types: Cigarettes  . Smokeless tobacco: Never Used     Comment: trying to quit  . Alcohol Use: No  . Drug Use: No  . Sexual Activity: Not on file   Other Topics Concern  . Not on file   Social History Narrative   Lives alone.  One daughter is local, but is getting ready to move to [place], where her children live with their father.  The other daughter lives near [place], [place].    Allergies  Allergen Reactions  . Amoxicillin Other (See Comments)    REACTION: Oral yeast infection  . Chlorzoxazone Other (See Comments)    REACTION: headache  . Codeine Other (See Comments)    REACTION: headache  . Darvocet [Propoxyphene N-Acetaminophen] Itching  . Dilaudid [Hydromorphone Hcl] Itching  . Flagyl [Metronidazole] Diarrhea  . Keflex [Cephalexin] Other (See Comments)    Pt does not recall reaction (maybe yeast infection)  . Morphine And Related Itching  . Nitrofurantoin Monohyd Macro  Hives    Reaction to [employer]  . Percocet [Oxycodone-Acetaminophen] Itching    Patient can tolerate Acetaminophen solely  . Sulfa Antibiotics Other (See Comments)    REACTION: Yeast infection in mouth  Current Facility-Administered Medications  Medication Dose Route Frequency Provider Last Rate Last Dose  . acetaminophen (TYLENOL) tablet 650 mg  650 mg Oral Q6H PRN [name], MD       Or  . acetaminophen (TYLENOL) suppository 650 mg  650 mg Rectal Q6H PRN [name], MD      . albuterol (PROVENTIL) (2.5 MG/3ML) 0.083% nebulizer solution 2.5 mg  2.5 mg Nebulization Q6H [name], MD      . Derrill Memo ON 02/01/2016] amLODipine (NORVASC) tablet 5 mg  5 mg Oral Daily [name], MD      . aspirin EC tablet 81 mg  81 mg Oral Daily [name], MD      . Derrill Memo ON 02/01/2016] benazepril (LOTENSIN) tablet 40 mg  40 mg Oral Daily [name], MD      . carisoprodol (SOMA) tablet 350 mg  350 mg Oral TID [name], MD      . clonazePAM [facility]) tablet 2 mg  2 mg Oral BID PRN [name], MD      . dextrose 5 %-0.45 % sodium chloride infusion   Intravenous Continuous [name], MD      . diphenhydrAMINE (BENADRYL) injection 12.5 mg  12.5 mg Intravenous Q6H PRN [name], MD       Or  . diphenhydrAMINE (BENADRYL) 12.5 MG/5ML elixir 12.5 mg  12.5 mg Oral Q6H PRN [name], MD      . docusate sodium (COLACE) capsule 100 mg  100 mg Oral BID [name], MD      . fentaNYL 40 mcg/mL PCA injection   Intravenous Q4H [name], MD      . Derrill Memo ON 02/01/2016] fluticasone (FLONASE) 50 MCG/ACT nasal spray 1 spray  1 spray Each Nare Daily [name], MD      . gabapentin (NEURONTIN) capsule 300 mg  300 mg Oral BID [name], MD      . HYDROcodone-acetaminophen (NORCO/VICODIN) 5-325 MG per tablet 1-2 tablet  1-2 tablet Oral Q4H PRN [name], MD      . midazolam (VERSED) injection 4 mg  4 mg Intravenous Once [name], MD        . naloxone [facility]) injection 0.4 mg  0.4 mg Intravenous PRN [name], MD       And  . sodium chloride flush (NS) 0.9 % injection 9 mL  9 mL Intravenous PRN [name], MD      . ondansetron [facility]) injection 4 mg  4 mg Intravenous Q6H PRN [name], MD      . ondansetron [facility]) tablet 4 mg  4 mg Oral Q6H PRN [name], MD      . Derrill Memo ON 02/01/2016] pantoprazole (PROTONIX) EC tablet 40 mg  40 mg Oral Daily [name], MD      . Derrill Memo ON 02/01/2016] pravastatin (PRAVACHOL) tablet 40 mg  40 mg Oral Daily [name], MD      . Derrill Memo ON 02/01/2016] QUEtiapine (SEROQUEL) tablet 100 mg  100 mg Oral QHS [name], MD      . sorbitol 70 % solution 30 mL  30 mL Oral Daily PRN [name], MD      . tiotropium [facility]) inhalation capsule 18 mcg  18 mcg Inhalation Daily [name], MD      . Derrill Memo ON 02/01/2016] venlafaxine XR (EFFEXOR-XR)  24 hr capsule 150 mg  150 mg Oral Daily [name], MD        Prescriptions prior to admission  Medication Sig Dispense Refill Last Dose  . acetaminophen (TYLENOL) 500 MG tablet Take 1,000 mg by mouth every 6 (six) hours as needed for headache.    Taking  . albuterol (PROVENTIL HFA;VENTOLIN HFA) 108 (90 BASE) MCG/ACT inhaler Inhale 1 puff into the lungs every 6 (six) hours as needed for wheezing or shortness of breath. 1 Inhaler 5 Taking  . amLODipine (NORVASC) 10 MG tablet Take 1 tablet (10 mg total) by mouth daily. 30 tablet 3 Taking  . benazepril (LOTENSIN) 40 MG tablet TAKE 1 TABLET BY MOUTH DAILY. 30 tablet 11 Taking  . carisoprodol (SOMA) 350 MG tablet Take 1 tablet (350 mg total) by mouth 3 (three) times daily. 90 tablet 1 Taking  . clonazePAM (KLONOPIN) 2 MG tablet Take 1 tablet (2 mg total) by mouth 2 (two) times daily as needed. 60 tablet 0 Taking  . cyclobenzaprine (FLEXERIL) 10 MG tablet Take 1 tablet (10 mg total) by mouth at bedtime. 15 tablet 0 Taking  . fentaNYL (DURAGESIC - DOSED MCG/HR) 50 MCG/HR  Place 1 patch (50 mcg total) onto the skin every 3 (three) days. 10 patch 0   . ferrous gluconate (FERGON) 324 MG tablet Take 1 tablet (324 mg total) by mouth 3 (three) times daily with meals. 90 tablet 6 Taking  . fluticasone (FLONASE) 50 MCG/ACT nasal spray Place 1 spray into both nostrils daily. 16 g 5 Taking  . gabapentin (NEURONTIN) 300 MG capsule TAKE 1 CAPSULE BY MOUTH 2 TIMES DAILY. 60 capsule 3 Taking  . guaiFENesin-dextromethorphan (ROBITUSSIN DM) 100-10 MG/5ML syrup Take 5 mLs by mouth every 4 (four) hours as needed for cough. 118 mL 0 Taking  . hydrochlorothiazide (MICROZIDE) 12.5 MG capsule Take 1 capsule (12.5 mg total) by mouth daily.   Taking  . HYDROcodone-acetaminophen (NORCO) 10-325 MG tablet Take 1 tablet by mouth every 6 (six) hours as needed (breakthrough pain). 120 tablet 0   . levothyroxine (SYNTHROID, LEVOTHROID) 75 MCG tablet Take 1 tablet (75 mcg total) by mouth daily before breakfast. 30 tablet 11 Taking  . lidocaine (LIDODERM) 5 % Place 1 patch onto the skin daily. Remove & Discard patch within 12 hours or as directed by MD 30 patch 0 Taking  . meclizine (ANTIVERT) 25 MG tablet TAKE 1 TABLET BY MOUTH 2 TIMES DAILY AS NEEDED FOR NAUSEA. 60 tablet 0 Taking  . methylPREDNISolone (MEDROL DOSEPAK) 4 MG TBPK tablet Take as directed 21 tablet 0 Taking  . naloxone (NARCAN) 0.4 MG/ML injection Inject 1 mL (0.4 mg total) into the vein as needed. 1 mL 3 Taking  . omeprazole (PRILOSEC) 20 MG capsule TAKE 1 CAPSULE BY MOUTH 2 TIMES DAILY BEFORE A MEAL. 60 capsule 11 Taking  . pravastatin (PRAVACHOL) 40 MG tablet TAKE 1 TABLET BY MOUTH DAILY. (Patient taking differently: TAKE 1 TABLET BY MOUTH DAILY AT BEDTIME) 30 tablet 11 Taking  . Psyllium (VEGETABLE LAXATIVE PO) Take 2 tablets by mouth at bedtime. Reported on 10/24/2015   Taking  . QUEtiapine (SEROQUEL) 50 MG tablet Take 100 mg by mouth at bedtime.    Taking  . rOPINIRole (REQUIP) 0.5 MG tablet TAKE 1 TABLET BY MOUTH AT BEDTIME FOR  1 WEEK THEN INCREASE TO 2 TABLETS AT BEDTIME. (STOP PRIMIDONE) 60 tablet 1 Taking  . senna-docusate (SENOKOT-S) 8.6-50 MG per tablet Take 2 tablets by mouth at bedtime  as needed for mild constipation. 60 tablet 0 Taking  . SPIRIVA HANDIHALER 18 MCG inhalation capsule PLACE 1 CAPSULE INTO INHALER AND INHALE DAILY. 30 capsule 1 Taking  . valACYclovir (VALTREX) 500 MG tablet TAKE 1 TABLET BY MOUTH DAILY. 30 tablet 11 Taking  . venlafaxine XR (EFFEXOR-XR) 150 MG 24 hr capsule TAKE 2 CAPSULES (300 MG TOTAL) BY MOUTH DAILY WITH BREAKFAST. (Patient taking differently: TAKE 1 CAPSULE (150 MG) BY MOUTH TWICE DAILY -BREAKFAST AND SUPPER) 60 capsule 11 Taking  . VOLTAREN 1 % GEL APPLY 2 GRAMS TO AFFECTED AREA 3 TIMES A DAY AS NEEDED FOR PAIN. 300 g 3 Taking    Family History  Problem Relation Age of Onset  . Kidney disease Mother   . Heart disease Father   . Anuerysm Brother 29    brain  . Heart disease Brother   . Heart disease Sister 72    s/p CABG  . Hypertension Sister      Review of Systems:       Cardiac Review of Systems: Y or N  Chest Pain [  Yes associated with pneumothorax  ]  Resting SOB [  yes ] Exertional SOB  [ yes ]  Orthopnea [  ]   Pedal Edema [   ]    Palpitations [  ] Syncope  [  ]   Presyncope [   ]  General Review of Systems: [Y] = yes [  ]=no Constitional: recent weight change [  ]; anorexia [  ]; fatigue [  ]; nausea [  ]; night sweats [  ]; fever [  ]; or chills [  ]                                                               Dental: poor dentition[  ]; Last Dentist visit: one year  Eye : blurred vision [  ]; diplopia [   ]; vision changes [  ];  Amaurosis fugax[  ]; Resp: active smoker,cough [ yes ];  wheezing[  ];  hemoptysis[  ]; shortness of breath[  ]; paroxysmal nocturnal dyspnea[  ]; dyspnea on exertion[  ]; or orthopnea[  ];  GI:  gallstones[  ], vomiting[  ];  dysphagia[  ]; melena[  ];  hematochezia [  ]; heartburn[  ];   Hx of  Colonoscopy[  ]; GU: kidney  stones [  ]; hematuria[  ];   dysuria [  ];  nocturia[  ];  history of     obstruction [  ]; urinary frequency [  ]             Skin: rash, swelling[  ];, hair loss[  ];  peripheral edema[  ];  or itching[  ]; Musculosketetal: myalgias[ yes ];  joint swelling[  ];  joint erythema[  ];  joint pain[yes  ];  back pain[yes  ];  Heme/Lymph: bruising[  ];  bleeding[  ];  anemia[  ];  Neuro: TIA[  ];  headaches[  ];  stroke[  ];  vertigo[  ];  seizures[  ];   paresthesias[  ];  difficulty walking[  ];  Psych:depression[  ]; anxiety[  ];  Endocrine: diabetes[  ];  thyroid dysfunction[yes  ];  Immunizations: Flu [  ];  Pneumococcal[  ];  Other: right-hand dominant  Physical Exam: BP 104/74 mmHg  Pulse 67  Temp(Src) 98.4 F (36.9 C) (Oral)  Resp 13  Ht 5\' 4"  (1.626 m)  Wt 144 lb (65.318 kg)  BMI 24.71 kg/m2  SpO2 93%       Physical Exam  General: middle-aged chronically ill anxious female in the ED HEENT: Normocephalic pupils equal , dentition adequate Neck: Supple without JVD, adenopathy, or bruit Chest: breath sounds severely diminished on the left, no rhonchi, no tenderness             or deformity. Large scar over most of spine, midline Cardiovascular: Regular rate and rhythm, no murmur, no gallop, peripheral pulses             palpable in all extremities Abdomen:  Soft, nontender, no palpable mass or organomegaly Extremities: Warm, well-perfused, no clubbing cyanosis edema or tenderness,              no venous stasis changes of the legs Rectal/GU: Deferred Neuro: Grossly non--focal and symmetrical throughout Skin: Clean and dry without rash or ulceration   Diagnostic Studies & Laboratory data:     Recent Radiology Findings:   Dg Chest 2 View  01/31/2016  CLINICAL DATA:  Chest pain and shortness of breath for 1 day. Status post intercostal nerve block. EXAM: CHEST  2 VIEW COMPARISON:  12/06/2015 FINDINGS: A moderate to large approximately 50% left pneumothorax is seen which is  new since previous study. No right-sided pneumothorax. Both lungs are clear. Heart size is within normal limits. Lower thoracic and lumbar spine fusion hardware again noted. IMPRESSION: Moderate to large approximately 50% left pneumothorax. Critical Value/emergent results were called by telephone at the time of interpretation on 01/31/2016 at 12:35 pm to Dr. Magnus Sinning , who verbally acknowledged these results. Electronically Signed   By: Earle Gell M.D.   On: 01/31/2016 12:39   Dg Chest Port 1 View  01/31/2016  CLINICAL DATA:  Chest tube placement for a pneumothorax. EXAM: PORTABLE CHEST 1 VIEW COMPARISON:  01/31/2016 FINDINGS: Left-sided chest tube has been placed. The left lung is re-expanded. There is no significant pneumothorax remaining. Right lung remains clear. Again noted is surgical hardware in the thoracolumbar spine. Few patchy densities in the left lower lung are suggestive for atelectasis. Heart size is within normal limits. IMPRESSION: Re-expansion of the left lung following placement of a left chest tube. No significant pneumothorax is remaining. Electronically Signed   By: Markus Daft M.D.   On: 01/31/2016 16:31     I have independently reviewed the above radiologic studies.  Recent Lab Findings: Lab Results  Component Value Date   WBC 8.8 01/31/2016   HGB 11.4* 01/31/2016   HCT 36.2 01/31/2016   PLT 245 01/31/2016   GLUCOSE 110* 01/31/2016   CHOL 244* 05/05/2014   TRIG 464* 05/05/2014   HDL 53 05/05/2014   LDLDIRECT 145* 05/06/2014   LDLCALC NOT CALC 05/05/2014   ALT 14 08/02/2015   AST 25 08/02/2015   NA 138 01/31/2016   K 3.4* 01/31/2016   CL 103 01/31/2016   CREATININE 1.05* 01/31/2016   BUN 17 01/31/2016   CO2 26 01/31/2016   TSH 5.210* 05/22/2015   INR 0.87 05/26/2012   HGBA1C 5.9 10/10/2013      Assessment / Plan:      3 French chest tube placed under local anesthesia and IV conscious monitored sedation in the emergency department. Initial air leak  has resolved.  Patient be admitted to [place] for chest tube therapy of iatrogenic pneumothorax following  Left posterior subcostal pain injection   01/31/2016 6:38 PM

## 2016-01-31 NOTE — ED Notes (Signed)
Fentanyl 25 MCG IV Suction applied to [facility] Chest tube collection unit. Small amount bubling noted i n water seal.

## 2016-01-31 NOTE — ED Notes (Signed)
Fentanyl 55mcg IV

## 2016-01-31 NOTE — ED Notes (Addendum)
Pt here per [facility] with diagnosis of Left sided pneumothorax.  Pt seen at [facility] for left intercostal nerve block.  Had CXR that shows pneumothorax on left.  [facility] called for transport.  PT hypoxic in 80's on room air per [facility] crew.  O2 applied and IV placed.

## 2016-02-01 ENCOUNTER — Inpatient Hospital Stay (HOSPITAL_COMMUNITY): Payer: Medicare Other

## 2016-02-01 DIAGNOSIS — J939 Pneumothorax, unspecified: Secondary | ICD-10-CM

## 2016-02-01 LAB — BASIC METABOLIC PANEL
Anion gap: 6 (ref 5–15)
BUN: 20 mg/dL (ref 6–20)
CO2: 29 mmol/L (ref 22–32)
Calcium: 9.2 mg/dL (ref 8.9–10.3)
Chloride: 106 mmol/L (ref 101–111)
Creatinine, Ser: 0.99 mg/dL (ref 0.44–1.00)
GFR calc Af Amer: >60 mL/min (ref >60)
GFR calc non Af Amer: >60 mL/min (ref >60)
Glucose, Bld: 123 mg/dL — ABNORMAL HIGH (ref 65–99)
Potassium: 3.7 mmol/L (ref 3.5–5.1)
Sodium: 141 mmol/L (ref 135–145)

## 2016-02-01 LAB — CBC
HCT: 36.9 % (ref 36.0–46.0)
Hemoglobin: 11.4 g/dL — ABNORMAL LOW (ref 12.0–15.0)
MCH: 29.5 pg (ref 26.0–34.0)
MCHC: 30.9 g/dL (ref 30.0–36.0)
MCV: 95.3 fL (ref 78.0–100.0)
Platelets: 242 10*3/uL (ref 150–400)
RBC: 3.87 MIL/uL (ref 3.87–5.11)
RDW: 15.9 % — ABNORMAL HIGH (ref 11.5–15.5)
WBC: 11.2 10*3/uL — ABNORMAL HIGH (ref 4.0–10.5)

## 2016-02-01 MED ORDER — POTASSIUM CHLORIDE CRYS ER 20 MEQ PO TBCR
30.0000 meq | EXTENDED_RELEASE_TABLET | Freq: Once | ORAL | Status: AC
  Administered 2016-02-01: 30 meq via ORAL
  Filled 2016-02-01: qty 1

## 2016-02-01 MED ORDER — ENOXAPARIN SODIUM 40 MG/0.4ML ~~LOC~~ SOLN
40.0000 mg | SUBCUTANEOUS | Status: DC
  Administered 2016-02-01 – 2016-02-03 (×3): 40 mg via SUBCUTANEOUS
  Filled 2016-02-01 (×3): qty 0.4

## 2016-02-01 MED ORDER — NICOTINE 21 MG/24HR TD PT24
21.0000 mg | MEDICATED_PATCH | Freq: Every day | TRANSDERMAL | Status: DC
Start: 2016-02-01 — End: 2016-02-04
  Administered 2016-02-01 – 2016-02-04 (×4): 21 mg via TRANSDERMAL
  Filled 2016-02-01 (×4): qty 1

## 2016-02-01 MED ORDER — POLYETHYLENE GLYCOL 3350 17 G PO PACK
17.0000 g | PACK | Freq: Every day | ORAL | Status: DC
  Administered 2016-02-01 – 2016-02-03 (×3): 17 g via ORAL
  Filled 2016-02-01 (×4): qty 1

## 2016-02-01 NOTE — Progress Notes (Signed)
Pt's chest tube has been placed to water seal. Will continue to monitor.   Grant Fontana RN, BSN

## 2016-02-01 NOTE — Discharge Summary (Signed)
Physician Discharge Summary       [address]       [place] [address]             [phone]    Patient ID: NYKOLE BEIL MRN: [id number] DOB/AGE: August 02, 1956 60 y.o.  Admit date: 01/31/2016 Discharge date: 02/06/2016  Admission Diagnoses: Iatrogenic left pneumothorax  Active Diagnoses:  1.Postlaminectomy syndrome, thoracic region 2.Osteoarthrosis 3.Dysthymic disorder 4.Calcifying tendinitis of shoulder 5.Chronic pain syndrome 6.Hypertension 7.Hyperlipidemia 8.GERD (gastroesophageal reflux disease) 9.Thyroid disease 10.Restless leg syndrome 11. Lumbago 12. COPD (chronic obstructive pulmonary disease)-on nocturnal home oxygen ([place]) 13.Sleep apnea-does not use oxygen or machine at night as instructed,. 14.Anxiety 15. Herpes genitalia 16. Tobacco abuse  Procedure (s):  Placement of left chest tube, 18-French by Dr. Prescott Gum on 01/31/2016.  History of Presenting Illness: This is a 60 year old Caucasian female with heavy smoking history and COPD presents with spontaneous pneumothorax which falls at intercostal nerve block at a community [facility] office yesterday. The patient has chronic pain syndrome and is status post 18 back operations. Following the subcostal block on the left side, she developed shortness of breath and chest pain. Chest x-ray taken today shows a large left pneumothorax. Clinically, she does require oxygen since reported to the emergency department-4 L per minute period. There is no history of previous pneumothorax  Of note, the patient had a right VATS at an outside hospital for exposure of the lower thoracic spine for spine surgery several years ago.  After informed consent and a proper timeout with IV conscious sedation monitored patient had a left chest tube placed via anterior approach because of her scoliosis spine and malalignment of her ribs laterally.followup chest x-ray showed reexpansion of the left lung. The patient  was admitted for chest tube placement.  Brief Hospital Course:  She remained afebrile and hemodynamically stable. Chest xray showed re expansion of left lung following chest tube placement. There was no air leak. Chest tube was placed to water seal on 06/01. Chest x ray remained stable. Chest tube was removed on 06/03. Patient has a history of COPD and uses oxygen at night. She was on 5 liters of oxygen via [place] while in the hospital. She was weaned to room air.  She has chronic pain and is controlled on her current home regimen of medications.  Her follow up CXR is free from pneumothorax.  She is ambulating and felt medically stable for discharge home today.   Latest Vital Signs: Blood pressure 135/73, pulse 71, temperature 97.9 F (36.6 C), temperature source Oral, resp. rate 16, height 5\' 4"  (1.626 m), weight 146 lb 2.6 oz (66.3 kg), SpO2 99 %.  Physical Exam: Cardiovascular: RRR Pulmonary: Clear to auscultation bilaterally; no rales, wheezes, or rhonchi..   Discharge Condition:Stable and discharged to home.  Recent laboratory studies:  Lab Results  Component Value Date   WBC 11.2* 02/01/2016   HGB 11.4* 02/01/2016   HCT 36.9 02/01/2016   MCV 95.3 02/01/2016   PLT 242 02/01/2016   Lab Results  Component Value Date   NA 141 02/01/2016   K 3.7 02/01/2016   CL 106 02/01/2016   CO2 29 02/01/2016   CREATININE 0.99 02/01/2016   GLUCOSE 123* 02/01/2016      Diagnostic Studies: Dg Chest 2 View  02/04/2016  CLINICAL DATA:  Post chest tube removal yesterday EXAM: CHEST  2 VIEW COMPARISON:  02/03/2016 FINDINGS: Interval removal of left chest tube. No visible pneumothorax. Lungs are clear. Heart is normal  size. IMPRESSION: Left chest tube removal without visible pneumothorax. No acute findings. Electronically Signed   By: Rolm Baptise M.D.   On: 02/04/2016 10:07   Dg Chest 2 View  02/03/2016  CLINICAL DATA:  Pneumothorax, chest tube in place. EXAM: CHEST  2 VIEW COMPARISON:  02/02/2016  FINDINGS: Left chest tube remains in place. No visible pneumothorax. Improving aeration at the right lung base. No confluent opacities currently. Heart is borderline in size. No effusions or acute bony abnormality. IMPRESSION: Improving aeration of the right lung base. No pneumothorax. No active disease. Electronically Signed   By: Rolm Baptise M.D.   On: 02/03/2016 10:07   Dg Chest 2 View  01/31/2016  CLINICAL DATA:  Chest pain and shortness of breath for 1 day. Status post intercostal nerve block. EXAM: CHEST  2 VIEW COMPARISON:  12/06/2015 FINDINGS: A moderate to large approximately 50% left pneumothorax is seen which is new since previous study. No right-sided pneumothorax. Both lungs are clear. Heart size is within normal limits. Lower thoracic and lumbar spine fusion hardware again noted. IMPRESSION: Moderate to large approximately 50% left pneumothorax. Critical Value/emergent results were called by telephone at the time of interpretation on 01/31/2016 at 12:35 pm to Dr. Magnus Sinning , who verbally acknowledged these results. Electronically Signed   By: Earle Gell M.D.   On: 01/31/2016 12:39   Dg Chest Port 1 View  02/02/2016  CLINICAL DATA:  Chest soreness, chest tube drainage of left pneumothorax EXAM: PORTABLE CHEST 1 VIEW COMPARISON:  Portable chest x-ray of February 01, 2016 and PA and lateral chest x-ray of December 06, 2015. FINDINGS: The lungs are well-expanded. No persistent left-sided pneumothorax is evident. The left-sided chest tube is in stable position with the tip projecting over the lateral aspect of the left fourth rib. There is no pleural effusion. Persistent retrocardiac density on the right is present. The cardiac silhouette is mildly enlarged. The pulmonary vascularity is normal. IMPRESSION: No pneumothorax is evident today. The chest tube is in stable position. Persistent right lower lobe atelectasis or pneumonia. Electronically Signed   By: David  Martinique M.D.   On: 02/02/2016 07:38    Portable Chest 1 View  02/01/2016  CLINICAL DATA:  Pneumothorax. EXAM: PORTABLE CHEST 1 VIEW COMPARISON:  01/31/2016. FINDINGS: Left chest tube is noted coiled over the left chest. No pneumothorax. Low lung volumes with basilar atelectasis. Heart size normal. Prior thoracolumbar spine fusion. IMPRESSION: 1. Left chest tube noted coiled over the left chest. No pneumothorax. 2.  Low lung volumes with mild bibasilar atelectasis. Electronically Signed   By: Marcello Moores  Register   On: 02/01/2016 07:55   Dg Chest Port 1 View  01/31/2016  CLINICAL DATA:  Chest tube placement for a pneumothorax. EXAM: PORTABLE CHEST 1 VIEW COMPARISON:  01/31/2016 FINDINGS: Left-sided chest tube has been placed. The left lung is re-expanded. There is no significant pneumothorax remaining. Right lung remains clear. Again noted is surgical hardware in the thoracolumbar spine. Few patchy densities in the left lower lung are suggestive for atelectasis. Heart size is within normal limits. IMPRESSION: Re-expansion of the left lung following placement of a left chest tube. No significant pneumothorax is remaining. Electronically Signed   By: Markus Daft M.D.   On: 01/31/2016 16:31   Discharge Medications:   Medication List    TAKE these medications        acetaminophen 500 MG tablet  Commonly known as:  TYLENOL  Take 1,000 mg by mouth every 6 (six) hours  as needed for headache.     albuterol 108 (90 Base) MCG/ACT inhaler  Commonly known as:  PROVENTIL HFA;VENTOLIN HFA  Inhale 1 puff into the lungs every 6 (six) hours as needed for wheezing or shortness of breath.     amLODipine 10 MG tablet  Commonly known as:  NORVASC  Take 1 tablet (10 mg total) by mouth daily.     benazepril 40 MG tablet  Commonly known as:  LOTENSIN  TAKE 1 TABLET BY MOUTH DAILY.     carisoprodol 350 MG tablet  Commonly known as:  SOMA  Take 1 tablet (350 mg total) by mouth 3 (three) times daily.     clonazePAM 2 MG tablet  Commonly known as:   KLONOPIN  Take 1 tablet (2 mg total) by mouth 2 (two) times daily as needed.     cyclobenzaprine 10 MG tablet  Commonly known as:  FLEXERIL  Take 1 tablet (10 mg total) by mouth at bedtime.     fentaNYL 50 MCG/HR  Commonly known as:  DURAGESIC - dosed mcg/hr  Place 1 patch (50 mcg total) onto the skin every 3 (three) days.     ferrous gluconate 324 MG tablet  Commonly known as:  FERGON  Take 1 tablet (324 mg total) by mouth 3 (three) times daily with meals.     fluticasone 50 MCG/ACT nasal spray  Commonly known as:  FLONASE  Place 1 spray into both nostrils daily.     gabapentin 300 MG capsule  Commonly known as:  NEURONTIN  TAKE 1 CAPSULE BY MOUTH 2 TIMES DAILY.     hydrochlorothiazide 12.5 MG capsule  Commonly known as:  MICROZIDE  Take 1 capsule (12.5 mg total) by mouth daily.     HYDROcodone-acetaminophen 10-325 MG tablet  Commonly known as:  NORCO  Take 1 tablet by mouth every 6 (six) hours as needed (breakthrough pain).     levothyroxine 75 MCG tablet  Commonly known as:  SYNTHROID, LEVOTHROID  Take 1 tablet (75 mcg total) by mouth daily before breakfast.     lidocaine 5 %  Commonly known as:  LIDODERM  Place 1 patch onto the skin daily. Remove & Discard patch within 12 hours or as directed by MD     meclizine 25 MG tablet  Commonly known as:  ANTIVERT  TAKE 1 TABLET BY MOUTH 2 TIMES DAILY AS NEEDED FOR NAUSEA.     methylPREDNISolone 4 MG Tbpk tablet  Commonly known as:  MEDROL DOSEPAK  Take as directed     naloxone 0.4 MG/ML injection  Commonly known as:  NARCAN  Inject 1 mL (0.4 mg total) into the vein as needed.     omeprazole 20 MG capsule  Commonly known as:  PRILOSEC  TAKE 1 CAPSULE BY MOUTH 2 TIMES DAILY BEFORE A MEAL.     pravastatin 40 MG tablet  Commonly known as:  PRAVACHOL  TAKE 1 TABLET BY MOUTH DAILY.     QUEtiapine 50 MG tablet  Commonly known as:  SEROQUEL  Take 100 mg by mouth at bedtime.     rOPINIRole 0.5 MG tablet  Commonly  known as:  REQUIP  TAKE 1 TABLET BY MOUTH AT BEDTIME FOR 1 WEEK THEN INCREASE TO 2 TABLETS AT BEDTIME. (STOP PRIMIDONE)     senna-docusate 8.6-50 MG tablet  Commonly known as:  Senokot-S  Take 2 tablets by mouth at bedtime as needed for mild constipation.     SPIRIVA HANDIHALER 18 MCG inhalation capsule  Generic drug:  tiotropium  PLACE 1 CAPSULE INTO INHALER AND INHALE DAILY.     tiZANidine 4 MG tablet  Commonly known as:  ZANAFLEX  Take 4 mg by mouth 2 (two) times daily as needed. pain     valACYclovir 500 MG tablet  Commonly known as:  VALTREX  TAKE 1 TABLET BY MOUTH DAILY.     VEGETABLE LAXATIVE PO  Take 2 tablets by mouth at bedtime. Reported on 10/24/2015     venlafaxine XR 150 MG 24 hr capsule  Commonly known as:  EFFEXOR-XR  TAKE 2 CAPSULES (300 MG TOTAL) BY MOUTH DAILY WITH BREAKFAST.     VOLTAREN 1 % Gel  Generic drug:  diclofenac sodium  APPLY 2 GRAMS TO AFFECTED AREA 3 TIMES A DAY AS NEEDED FOR PAIN.        Follow Up Appointments: Follow-up Information    Follow up with [name] III, MD On 02/14/2016.   Specialty:  Cardiothoracic Surgery   Why:  PA/LAT CXR to be taken (at [facility] which is in the same building as Dr. Lucianne Lei Trigt's office) on 02/14/2016 at 11:00 am;Appointment time is at 11:30    Contact information:   [place] [phone]       Signed: Cinda Quest 02/06/2016, 8:20 AM

## 2016-02-01 NOTE — Progress Notes (Addendum)
      [address]       [place] [address]             [phone]            Subjective: Patient without complaints this am.  Objective: Vital signs in last 24 hours: Temp:  [97.9 F (36.6 C)-98.5 F (36.9 C)] 97.9 F (36.6 C) (06/01 0514) Pulse Rate:  [67-100] 68 (06/01 0514) Cardiac Rhythm:  [-] Normal sinus rhythm;Bundle branch block (05/31 1900) Resp:  [11-22] 12 (06/01 0514) BP: (97-130)/(55-85) 110/76 mmHg (06/01 0514) SpO2:  [91 %-100 %] 95 % (06/01 0514) Weight:  [144 lb (65.318 kg)-146 lb 2.6 oz (66.3 kg)] 146 lb 2.6 oz (66.3 kg) (05/31 1830)      Intake/Output from previous day: 05/31 0701 - 06/01 0700 In: 240 [P.O.:240] Out: -    Physical Exam:  Cardiovascular: RRR Pulmonary: Clear to auscultation bilaterally; no rales, wheezes, or rhonchi.. Chest Tube: to suction, no air leak.  Lab Results: CBC: Recent Labs  01/31/16 1508 02/01/16 0320  WBC 8.8 11.2*  HGB 11.4* 11.4*  HCT 36.2 36.9  PLT 245 242   BMET:  Recent Labs  01/31/16 1508 02/01/16 0320  NA 138 141  K 3.4* 3.7  CL 103 106  CO2 26 29  GLUCOSE 110* 123*  BUN 17 20  CREATININE 1.05* 0.99  CALCIUM 9.1 9.2    PT/INR: No results for input(s): LABPROT, INR in the last 72 hours. ABG:  INR: Will add last result for INR, ABG once components are confirmed Will add last 4 CBG results once components are confirmed  Assessment/Plan:  1. CV - SR in the 60's. 2.  Pulmonary - S/p left chest tube for iatrogenic pneumothorax. History of COPD but not on oxygen prior to admission. On 5 liters of oxygen via . CXR this am appears to show no pneumothorax. Chest tube is to suction and there is no pneumothorax. Hope to place chest tube to water seal soon. Check CXR in am. 3. Supplement potassium  ZIMMERMAN,DONIELLE MPA-C 02/01/2016,7:44 AM  Chest tube to water seal today. Followup chest x-ray in a.m. patient examined and medical record reviewed,agree with above  note. Tharon Aquas Trigt III 02/01/2016

## 2016-02-01 NOTE — Op Note (Signed)
NAMEJEMMA, HUDKINS NO.:  [id number]  MEDICAL RECORD NO.:  [id number]  LOCATION:  [id number]                        FACILITY:  [facility]  PHYSICIAN:  [name], M.D.  DATE OF BIRTH:  [birth year]  DATE OF PROCEDURE:  01/31/2016 DATE OF DISCHARGE:                              OPERATIVE REPORT   OPERATION:  Placement of left chest tube, 18-French.  PREOPERATIVE DIAGNOSIS:  Large left pneumothorax.  POSTOPERATIVE DIAGNOSIS:  Large left pneumothorax.  SURGEON:  [name], M.D.  ANESTHESIA:  IV conscious monitored sedation and local 1% lidocaine.  DESCRIPTION OF PROCEDURE:  The patient was evaluated in the emergency department where she presented with shortness of breath, low oxygen saturations and left chest pain.  Chest x-ray had been previously taken showing a large left pneumothorax.  Previous to her symptoms, she had had a left posterior subcostal injections of pain medicine for chronic back pain related to multiple previous back surgeries.  I recommended placement of chest tube for treatment of her pneumothorax and discussed the procedure in detail with the patient and her family.  She understood the risks and the alternatives including risks of bleeding, pain, and recurrent pneumothorax.  Informed consent was obtained.  DESCRIPTION OF PROCEDURE:  The patient was placed supine on the bed in her emergency department room.  The left chest was prepped and draped anteriorly.  A proper time-out was performed.  A 1% lidocaine was infiltrated in the third interspace in the midclavicular line. Intravenous Versed and fentanyl were administered and the patient was monitored with an EKG monitor blood pressure cuff and end-tidal CO2 transducer.  A small incision was made and further lidocaine was infiltrated down to the interspace.  The left pleural space was entered with a hemostat and a large rush of air exited the small incision.  Through the incision,  a trocar guided 18-French chest tube was positioned in the pleural space and directed laterally in the thigh and the upper trocar was removed.  The chest tube was clamped and then connected to a Pleur-evac water-seal drainage system.  The chest tube was secured to the skin with silk suture and a sterile dressing was applied.  A followup chest x-ray showed resolution of the pneumothorax and the chest tube in good position.     [name], M.D.     PV/MEDQ  D:  02/01/2016  T:  02/01/2016  Job:  [id number]  cc:   [name], M.D.

## 2016-02-02 ENCOUNTER — Inpatient Hospital Stay (HOSPITAL_COMMUNITY): Payer: Medicare Other

## 2016-02-02 DIAGNOSIS — J939 Pneumothorax, unspecified: Secondary | ICD-10-CM

## 2016-02-02 MED ORDER — ALBUTEROL SULFATE (2.5 MG/3ML) 0.083% IN NEBU: 2.5000 mg | INHALATION_SOLUTION | Freq: Four times a day (QID) | RESPIRATORY_TRACT | Status: DC | PRN

## 2016-02-02 NOTE — Care Management Important Message (Signed)
Important Message  Patient Details  Name: Jill Phillips MRN: [id number] Date of Birth: Jan 14, 1956   Medicare Important Message Given:  Yes    Loann Quill 02/02/2016, 9:28 AM

## 2016-02-02 NOTE — Progress Notes (Addendum)
      [address]       ,[place] [address]             [phone]            Subjective: Patient without complaints this am.  Objective: Vital signs in last 24 hours: Temp:  [98 F (36.7 C)-98.7 F (37.1 C)] 98 F (36.7 C) (06/02 0546) Pulse Rate:  [62-82] 62 (06/02 0546) Cardiac Rhythm:  [-] Normal sinus rhythm (06/02 0700) Resp:  [11-18] 18 (06/02 0546) BP: (100-121)/(58-65) 100/59 mmHg (06/02 0546) SpO2:  [91 %-98 %] 96 % (06/02 0546)      Intake/Output from previous day: 06/01 0701 - 06/02 0700 In: 1440 [P.O.:1440] Out: -    Physical Exam:  Cardiovascular: RRR Pulmonary: Slightly diminished right base and clear on left. Chest Tube: to water seal no air leak.  Lab Results: CBC:  Recent Labs  01/31/16 1508 02/01/16 0320  WBC 8.8 11.2*  HGB 11.4* 11.4*  HCT 36.2 36.9  PLT 245 242   BMET:   Recent Labs  01/31/16 1508 02/01/16 0320  NA 138 141  K 3.4* 3.7  CL 103 106  CO2 26 29  GLUCOSE 110* 123*  BUN 17 20  CREATININE 1.05* 0.99  CALCIUM 9.1 9.2    PT/INR: No results for input(s): LABPROT, INR in the last 72 hours. ABG:  INR: Will add last result for INR, ABG once components are confirmed Will add last 4 CBG results once components are confirmed  Assessment/Plan:  1. CV - SR in the 60's. 2.  Pulmonary - S/p left chest tube for iatrogenic pneumothorax. History of COPD.On 2 liters of oxygen via [place]. Wean to room air as tolerates. CXR this am shows no pneumothorax and RLL consolidation. Chest tube is to water seal and there is no air leak. Hope to remove chest tube in am Check CXR in am. 3. Supplement potassium 4. Stop PCA after chest tube removed tomorrow  ZIMMERMAN,DONIELLE MPA-C 02/02/2016,8:04 AM   DC chest tube in a.m. If no change in x-ray Encourage ambulation patient examined and medical record reviewed,agree with above note. Tharon Aquas Trigt III 02/02/2016

## 2016-02-03 ENCOUNTER — Inpatient Hospital Stay (HOSPITAL_COMMUNITY): Payer: Medicare Other

## 2016-02-03 MED ORDER — FENTANYL 50 MCG/HR TD PT72
50.0000 ug | MEDICATED_PATCH | TRANSDERMAL | Status: DC
  Administered 2016-02-03: 50 ug via TRANSDERMAL
  Filled 2016-02-03: qty 1

## 2016-02-03 NOTE — Progress Notes (Addendum)
      [address]       [place] [address]             [phone]      Subjective:  Chronic back pain... No new complaints.... Nervous about chest tube removal.  Objective: Vital signs in last 24 hours: Temp:  [98.1 F (36.7 C)-98.2 F (36.8 C)] 98.2 F (36.8 C) (06/03 0448) Pulse Rate:  [66-77] 70 (06/03 0448) Cardiac Rhythm:  [-] Normal sinus rhythm (06/03 0714) Resp:  [10-20] 10 (06/03 0800) BP: (99-122)/(63-70) 100/63 mmHg (06/03 0448) SpO2:  [93 %-98 %] 96 % (06/03 0836)  Intake/Output from previous day: 06/02 0701 - 06/03 0700 In: 840 [P.O.:840] Out: -   General appearance: alert, cooperative and no distress Heart: regular rate and rhythm Lungs: clear to auscultation bilaterally Abdomen: soft, non-tender; bowel sounds normal; no masses,  no organomegaly Wound: clean and dry  Lab Results:  Recent Labs  01/31/16 1508 02/01/16 0320  WBC 8.8 11.2*  HGB 11.4* 11.4*  HCT 36.2 36.9  PLT 245 242   BMET:  Recent Labs  01/31/16 1508 02/01/16 0320  NA 138 141  K 3.4* 3.7  CL 103 106  CO2 26 29  GLUCOSE 110* 123*  BUN 17 20  CREATININE 1.05* 0.99  CALCIUM 9.1 9.2    PT/INR: No results for input(s): LABPROT, INR in the last 72 hours. ABG    Component Value Date/Time   TCO2 27 01/17/2010 1142   CBG (last 3)  No results for input(s): GLUCAP in the last 72 hours.  Assessment/Plan:  1. CV- NSR 2. Pulm- COPD, Iatrogenic pneumothorax, no air leak from pleuro vac, no pneumothorax on CXR, will possibly remove chest tube today 3. Chronic pain- will d/c PCA after chest tube removal, resume home pain medications 4. Dispo- patient stable, likely remove chest tube today, repeat CXR in AM   LOS: 3 days    BARRETT, ERIN 02/03/2016  I have seen and examined the patient and agree with the assessment and plan as outlined.  Rexene Alberts, MD 02/03/2016 12:16 PM

## 2016-02-03 NOTE — Progress Notes (Signed)
Chest tube removed without complications, pt tolerated well.  Covered with vaseline gauze, gauze and hypafix.

## 2016-02-04 ENCOUNTER — Inpatient Hospital Stay (HOSPITAL_COMMUNITY): Payer: Medicare Other

## 2016-02-04 NOTE — Progress Notes (Signed)
Order to discharge received.  IV and tele removed, CCMD notified.  Discharge instructions reviewed with patient. Pt family notified and on their way to pick up.

## 2016-02-04 NOTE — Progress Notes (Signed)
      [address]       [place] [address]             [phone]      Subjective:  No complaints.  Pain is controlled  Objective: Vital signs in last 24 hours: Temp:  [97.9 F (36.6 C)-98.7 F (37.1 C)] 97.9 F (36.6 C) (06/04 0526) Pulse Rate:  [71-79] 71 (06/04 0526) Cardiac Rhythm:  [-] Normal sinus rhythm (06/04 0718) Resp:  [13-20] 16 (06/04 0526) BP: (101-137)/(67-78) 135/73 mmHg (06/04 0526) SpO2:  [94 %-99 %] 98 % (06/04 0526) FiO2 (%):  [42 %] 42 % (06/03 1153)  General appearance: alert, cooperative and no distress Heart: regular rate and rhythm Lungs: clear to auscultation bilaterally Abdomen: soft, non-tender; bowel sounds normal; no masses,  no organomegaly Wound: clean and dry  Lab Results: No results for input(s): WBC, HGB, HCT, PLT in the last 72 hours. BMET: No results for input(s): NA, K, CL, CO2, GLUCOSE, BUN, CREATININE, CALCIUM in the last 72 hours.  PT/INR: No results for input(s): LABPROT, INR in the last 72 hours. ABG    Component Value Date/Time   TCO2 27 01/17/2010 1142   CBG (last 3)  No results for input(s): GLUCAP in the last 72 hours.  Assessment/Plan:  1. Chest tube- removed yesterday, CXR ordered for 0600 but not completed, will assess when completed 2. Pulm- COPD, off oxygen, continue IS 3. Chronic back pain- on home pain medication regimen 4. DIspo- patient stable, hopefully can d/c home today... Will make decision once CXR is completed   LOS: 4 days    Jill Phillips, Jill Phillips 02/04/2016

## 2016-02-05 ENCOUNTER — Other Ambulatory Visit: Payer: Self-pay | Admitting: Physical Medicine & Rehabilitation

## 2016-02-07 ENCOUNTER — Emergency Department (HOSPITAL_COMMUNITY): Payer: Medicare Other

## 2016-02-07 ENCOUNTER — Emergency Department (HOSPITAL_COMMUNITY)
Admission: EM | Admit: 2016-02-07 | Discharge: 2016-02-07 | Disposition: A | Discharge status: Home / Self Care | Payer: Medicare Other | Attending: Emergency Medicine | Admitting: Emergency Medicine

## 2016-02-07 ENCOUNTER — Other Ambulatory Visit: Payer: Self-pay | Admitting: Internal Medicine

## 2016-02-07 ENCOUNTER — Encounter (HOSPITAL_COMMUNITY): Payer: Self-pay

## 2016-02-07 DIAGNOSIS — Z96651 Presence of right artificial knee joint: Secondary | ICD-10-CM | POA: Insufficient documentation

## 2016-02-07 DIAGNOSIS — Z4801 Encounter for change or removal of surgical wound dressing: Secondary | ICD-10-CM | POA: Insufficient documentation

## 2016-02-07 DIAGNOSIS — M1990 Unspecified osteoarthritis, unspecified site: Secondary | ICD-10-CM | POA: Insufficient documentation

## 2016-02-07 DIAGNOSIS — F17210 Nicotine dependence, cigarettes, uncomplicated: Secondary | ICD-10-CM | POA: Insufficient documentation

## 2016-02-07 DIAGNOSIS — Z79899 Other long term (current) drug therapy: Secondary | ICD-10-CM | POA: Insufficient documentation

## 2016-02-07 DIAGNOSIS — I10 Essential (primary) hypertension: Secondary | ICD-10-CM | POA: Diagnosis not present

## 2016-02-07 DIAGNOSIS — E039 Hypothyroidism, unspecified: Secondary | ICD-10-CM | POA: Insufficient documentation

## 2016-02-07 DIAGNOSIS — Z5189 Encounter for other specified aftercare: Secondary | ICD-10-CM

## 2016-02-07 DIAGNOSIS — J449 Chronic obstructive pulmonary disease, unspecified: Secondary | ICD-10-CM | POA: Insufficient documentation

## 2016-02-07 DIAGNOSIS — K0889 Other specified disorders of teeth and supporting structures: Secondary | ICD-10-CM | POA: Diagnosis not present

## 2016-02-07 DIAGNOSIS — Z4800 Encounter for change or removal of nonsurgical wound dressing: Secondary | ICD-10-CM | POA: Diagnosis not present

## 2016-02-07 DIAGNOSIS — R0602 Shortness of breath: Secondary | ICD-10-CM | POA: Diagnosis not present

## 2016-02-07 DIAGNOSIS — R079 Chest pain, unspecified: Secondary | ICD-10-CM | POA: Diagnosis not present

## 2016-02-07 MED ORDER — CLINDAMYCIN HCL 300 MG PO CAPS: 300.0000 mg | ORAL_CAPSULE | Freq: Four times a day (QID) | ORAL | Status: DC

## 2016-02-07 MED ORDER — BUPIVACAINE-EPINEPHRINE (PF) 0.5% -1:200000 IJ SOLN
1.8000 mL | Freq: Once | INTRAMUSCULAR | Status: AC
  Administered 2016-02-07: 1.8 mL
  Filled 2016-02-07: qty 1.8

## 2016-02-07 NOTE — Discharge Instructions (Signed)
Keep your scheduled follow up appointment with your dentist and cardiothoracic surgeon.  Shortness of Breath Shortness of breath means you have trouble breathing. It could also mean that you have a medical problem. You should get immediate medical care for shortness of breath. CAUSES   Not enough oxygen in the air such as with high altitudes or a smoke-filled room.  Certain lung diseases, infections, or problems.  Heart disease or conditions, such as angina or heart failure.  Low red blood cells (anemia).  Poor physical fitness, which can cause shortness of breath when you exercise.  Chest or back injuries or stiffness.  Being overweight.  Smoking.  Anxiety, which can make you feel like you are not getting enough air. DIAGNOSIS  Serious medical problems can often be found during your physical exam. Tests may also be done to determine why you are having shortness of breath. Tests may include:  Chest X-rays.  Lung function tests.  Blood tests.  An electrocardiogram (ECG).  An ambulatory electrocardiogram. An ambulatory ECG records your heartbeat patterns over a 24-hour period.  Exercise testing.  A transthoracic echocardiogram (TTE). During echocardiography, sound waves are used to evaluate how blood flows through your heart.  A transesophageal echocardiogram (TEE).  Imaging scans. Your health care provider may not be able to find a cause for your shortness of breath after your exam. In this case, it is important to have a follow-up exam with your health care provider as directed.  TREATMENT  Treatment for shortness of breath depends on the cause of your symptoms and can vary greatly. HOME CARE INSTRUCTIONS   Do not smoke. Smoking is a common cause of shortness of breath. If you smoke, ask for help to quit.  Avoid being around chemicals or things that may bother your breathing, such as paint fumes and dust.  Rest as needed. Slowly resume your usual activities.  If  medicines were prescribed, take them as directed for the full length of time directed. This includes oxygen and any inhaled medicines.  Keep all follow-up appointments as directed by your health care provider. SEEK MEDICAL CARE IF:   Your condition does not improve in the time expected.  You have a hard time doing your normal activities even with rest.  You have any new symptoms. SEEK IMMEDIATE MEDICAL CARE IF:   Your shortness of breath gets worse.  You feel light-headed, faint, or develop a cough not controlled with medicines.  You start coughing up blood.  You have pain with breathing.  You have chest pain or pain in your arms, shoulders, or abdomen.  You have a fever.  You are unable to walk up stairs or exercise the way you normally do. MAKE SURE YOU:  Understand these instructions.  Will watch your condition.  Will get help right away if you are not doing well or get worse.   This information is not intended to replace advice given to you by your health care provider. Make sure you discuss any questions you have with your health care provider.   Document Released: 05/14/2001 Document Revised: 08/24/2013 Document Reviewed: 11/04/2011 Elsevier Interactive Patient Education 2016 [place] Pain Dental pain may be caused by many things, including:  Tooth decay (cavities or caries). Cavities expose the nerve of your tooth to air and hot or cold temperatures. This can cause pain or discomfort.  Abscess or infection. A dental abscess is a collection of infected pus from a bacterial infection in the inner part of the  tooth (pulp). It usually occurs at the end of the tooth's root.  Injury.  An unknown reason (idiopathic). Your pain may be mild or severe. It may only occur when:  You are chewing.  You are exposed to hot or cold temperature.  You are eating or drinking sugary foods or beverages, such as soda or candy. Your pain may also be constant. HOME  CARE INSTRUCTIONS Watch your dental pain for any changes. The following actions may help to lessen any discomfort that you are feeling:  Take medicines only as directed by your dentist.  If you were prescribed an antibiotic medicine, finish all of it even if you start to feel better.  Keep all follow-up visits as directed by your dentist. This is important.  Do not apply heat to the outside of your face.  Rinse your mouth or gargle with salt water if directed by your dentist. This helps with pain and swelling.  You can make salt water by adding  tsp of salt to 1 cup of warm water.  Apply ice to the painful area of your face:  Put ice in a plastic bag.  Place a towel between your skin and the bag.  Leave the ice on for 20 minutes, 2-3 times per day.  Avoid foods or drinks that cause you pain, such as:  Very hot or very cold foods or drinks.  Sweet or sugary foods or drinks. SEEK MEDICAL CARE IF:  Your pain is not controlled with medicines.  Your symptoms are worse.  You have new symptoms. SEEK IMMEDIATE MEDICAL CARE IF:  You are unable to open your mouth.  You are having trouble breathing or swallowing.  You have a fever.  Your face, neck, or jaw is swollen.   This information is not intended to replace advice given to you by your health care provider. Make sure you discuss any questions you have with your health care provider.   Document Released: 08/19/2005 Document Revised: 01/03/2015 Document Reviewed: 08/15/2014 Elsevier Interactive Patient Education [employer].

## 2016-02-07 NOTE — ED Notes (Addendum)
Pt c/o R lower dental pain x 4 days and SOB w/ talking/"getting upset" and pain at previous chest tube insertion site x 3 days.  Pain score 10/10.  Pt reports taking OTC medications w/o relief.  Pt sts she has a dentist appointment on 6/9.  Pt was recently admitted to [facility] for a pneumothorax and discharged x 3 days ago.  Pt reports follow up scheduled for 6/14.

## 2016-02-07 NOTE — ED Notes (Signed)
Discharge instructions, follow up care, and rx x1 reviewed with patient. Patient verbalized understanding. 

## 2016-02-07 NOTE — ED Provider Notes (Signed)
CSN: [id number]     Arrival date & time 02/07/16  1041 History   First MD Initiated Contact with Patient 02/07/16 1101     Chief Complaint  Patient presents with  . Dental Pain  . Shortness of Breath  . Wound Check   HPI  Ms. Swartout is a 60 year old female with past medical history of chronic pain syndrome and recent pneumothorax presenting with dental pain and shortness of breath. Patient reports right lower dental pain has been intermittent for the past few months. She states that she has a dentist appointment in 2 days for evaluation of her dental pain. She states that the pain has acutely worsened over the past few days. The pain radiates to the right side of her face, her ear and down her right neck. She describes the pain as aching. Pain is exacerbated by eating and drinking. Denies fevers, chills, difficulty swallowing, difficulty breathing, neck swelling, cheek swelling or redness of the skin of her face and neck. She would also like a wound check of her chest tube site. She was hospitalized with a pneumothorax after receiving a thoracic back injection approximately one week ago. Her chest tube was removed 3 days ago. She reports persistent soreness at the chest tube site. It is exacerbated by movement of her upper body. She has kept the site covered so she is unsure if there has been redness or drainage. She has an appointment in one week to have the sutures removed. She is also complaining of shortness of breath since removal of the chest tube. She states that she gets short of breath when she is talking or "gets upset". Denies recent cough or hemoptysis. Denies trauma to the chest. No other complaints today.   Past Medical History  Diagnosis Date  . Postlaminectomy syndrome, thoracic region   . Osteoarthrosis, unspecified whether generalized or localized, lower leg   . Dysthymic disorder   . Calcifying tendinitis of shoulder   . Pain in joint, upper arm   . Chronic pain syndrome   .  Lumbago   . Primary localized osteoarthrosis, lower leg   . Hypertension   . Hyperlipidemia   . GERD (gastroesophageal reflux disease)   . Thyroid disease   . Restless leg syndrome   . History of blood transfusion 1980    related to "back surgery"  . Hypothyroidism   . Active smoker   . Heart murmur     for years, nothing to be concerned about  . Sleep apnea     s/p surgery- last sleep study 2011- doesnt use oxygen or machine at night as instructed,.   12/2014- Dr Halford Chessman  reports it is negative.  [name] Anxiety   . Herpes genitalia   . PONV (postoperative nausea and vomiting)     gets nauseous  with longer surgery. Difficuty voiding after surgery  . COPD (chronic obstructive pulmonary disease) ([place])   . Emphysema lung ([place])   . Headache     "weekly maybe" (01/31/2016)  . Chronic back pain   . Pneumothorax, left 01/31/2016    S/P Left posterior subcostal pain injection on 01/30/2016   Past Surgical History  Procedure Laterality Date  . Appendectomy    . Vaginal hysterectomy    . Tubal ligation    . Total hip arthroplasty Right   . Knee arthroscopy Right   . Hammer toe surgery    . Uvulopalatopharyngoplasty    . Total knee arthroplasty  05/29/2012    Procedure: TOTAL KNEE  ARTHROPLASTY;  Surgeon: Mcarthur Rossetti, MD;  Location: [facility];  Service: Orthopedics;  Laterality: Right;  Right Total Knee Arthroplasty  . Lumbar laminectomy/decompression microdiscectomy N/A 01/28/2014    Procedure: Minimally Invasive Right  L1-2 Microdiscectomy;  Surgeon: [name], MD;  Location: [facility];  Service: Orthopedics;  Laterality: N/A;  . Joint replacement    . Back surgery      18 back surgeries (2 thoracic & 16 lumbar) (01/31/2016)  . Lumbar fusion N/A 08/01/2015    Procedure: Right sided L1-2 and L2-3 transforaminal lumbar interbody fusion with cages, Extension of posterior fusion T12 to L3, Replaced pedicle screws bilaterally L1-L2 , Replaced left sided pedicle screws L-3. Instrumentation T12  to L3 using local bone graft, Vivigen allograft and cancellous chips;  Surgeon: [name], MD;  Location: [facility];  Service: Orthopedics;  Laterality: N/A;  . Laparoscopic cholecystectomy    . Tonsillectomy    . Dilation and curettage of uterus     Family History  Problem Relation Age of Onset  . Kidney disease Mother   . Heart disease Father   . Anuerysm Brother 29    brain  . Heart disease Brother   . Heart disease Sister 60    s/p CABG  . Hypertension Sister    Social History  Substance Use Topics  . Smoking status: Current Every Day Smoker -- 1.25 packs/day for 45 years    Types: Cigarettes  . Smokeless tobacco: Never Used  . Alcohol Use: No   OB History    No data available     Review of Systems  All other systems reviewed and are negative.     Allergies  Amoxicillin; Chlorzoxazone; Codeine; Darvocet; Dilaudid; Flagyl; Keflex; Morphine and related; Nitrofurantoin monohyd macro; Percocet; and Sulfa antibiotics  Home Medications   Prior to Admission medications   Medication Sig Start Date End Date Taking? Authorizing Provider  acetaminophen (TYLENOL) 500 MG tablet Take 1,000 mg by mouth every 6 (six) hours as needed for headache.    Yes Historical Provider, MD  albuterol (PROVENTIL HFA;VENTOLIN HFA) 108 (90 BASE) MCG/ACT inhaler Inhale 1 puff into the lungs every 6 (six) hours as needed for wheezing or shortness of breath. 12/21/14  Yes Chesley Mires, MD  amLODipine (NORVASC) 10 MG tablet Take 1 tablet (10 mg total) by mouth daily. 10/24/15 10/23/16 Yes Marjan Rabbani, MD  benazepril (LOTENSIN) 40 MG tablet TAKE 1 TABLET BY MOUTH DAILY. 10/27/15  Yes Alexa Angela Burke, MD  clonazePAM (KLONOPIN) 2 MG tablet TAKE 1 TABLET BY MOUTH 2 TIMES A DAY AS NEEDED. 02/06/16  Yes Meredith Staggers, MD  fentaNYL (DURAGESIC - DOSED MCG/HR) 50 MCG/HR Place 1 patch (50 mcg total) onto the skin every 3 (three) days. 01/12/16  Yes Bayard Hugger, NP  fluticasone (FLONASE) 50 MCG/ACT nasal spray  Place 1 spray into both nostrils daily. Patient taking differently: Place 1 spray into both nostrils daily as needed for allergies.  11/22/15 11/16/16 Yes Alexa Angela Burke, MD  gabapentin (NEURONTIN) 300 MG capsule TAKE 1 CAPSULE BY MOUTH 2 TIMES DAILY. 10/13/15  Yes Meredith Staggers, MD  hydrochlorothiazide (MICROZIDE) 12.5 MG capsule Take 1 capsule (12.5 mg total) by mouth daily. 10/13/15  Yes Ejiroghene Arlyce Dice, MD  HYDROcodone-acetaminophen (NORCO) 10-325 MG tablet Take 1 tablet by mouth every 6 (six) hours as needed (breakthrough pain). 01/12/16  Yes Bayard Hugger, NP  levothyroxine (SYNTHROID, LEVOTHROID) 75 MCG tablet Take 1 tablet (75 mcg total) by  mouth daily before breakfast. 09/07/15  Yes Alexa Angela Burke, MD  meclizine (ANTIVERT) 25 MG tablet TAKE 1 TABLET BY MOUTH 2 TIMES DAILY AS NEEDED FOR NAUSEA. 12/08/15  Yes Alexa Angela Burke, MD  naproxen (NAPROSYN) 500 MG tablet Take 500 mg by mouth 2 (two) times daily. 01/19/16  Yes Historical Provider, MD  omeprazole (PRILOSEC) 20 MG capsule TAKE 1 CAPSULE BY MOUTH 2 TIMES DAILY BEFORE A MEAL. 08/17/15  Yes Alexa Angela Burke, MD  pravastatin (PRAVACHOL) 40 MG tablet TAKE 1 TABLET BY MOUTH DAILY. 01/09/15  Yes Alexa Angela Burke, MD  Psyllium (VEGETABLE LAXATIVE PO) Take 2 tablets by mouth at bedtime. Reported on 10/24/2015   Yes Historical Provider, MD  QUEtiapine (SEROQUEL) 50 MG tablet Take 100 mg by mouth at bedtime.  08/05/14  Yes Historical Provider, MD  rOPINIRole (REQUIP) 0.5 MG tablet TAKE 1 TABLET BY MOUTH AT BEDTIME FOR 1 WEEK THEN INCREASE TO 2 TABLETS AT BEDTIME. (STOP PRIMIDONE) Patient taking differently: take 1 to 2 tablets at bedtime as needed for restless legs 01/04/16  Yes Meredith Staggers, MD  SPIRIVA HANDIHALER 18 MCG inhalation capsule PLACE 1 CAPSULE INTO INHALER AND INHALE DAILY. 10/13/15  Yes Chesley Mires, MD  tiZANidine (ZANAFLEX) 4 MG tablet Take 4 mg by mouth 2 (two) times daily as needed for muscle spasms. pain 01/19/16  Yes Historical Provider, MD    valACYclovir (VALTREX) 500 MG tablet TAKE 1 TABLET BY MOUTH DAILY. 05/29/15  Yes Alexa Angela Burke, MD  venlafaxine XR (EFFEXOR-XR) 150 MG 24 hr capsule TAKE 2 CAPSULES (300 MG TOTAL) BY MOUTH DAILY WITH BREAKFAST. Patient taking differently: take 300mg s at bedtime 06/20/15  Yes Alexa R Burns, MD  VOLTAREN 1 % GEL APPLY 2 GRAMS TO AFFECTED AREA 3 TIMES A DAY AS NEEDED FOR PAIN. 12/07/15  Yes Meredith Staggers, MD  carisoprodol (SOMA) 350 MG tablet Take 1 tablet (350 mg total) by mouth 3 (three) times daily. Patient not taking: Reported on 02/07/2016 12/13/15   Meredith Staggers, MD  clindamycin (CLEOCIN) 300 MG capsule Take 1 capsule (300 mg total) by mouth 4 (four) times daily. 02/07/16   Eunice Winecoff, PA-C  cyclobenzaprine (FLEXERIL) 10 MG tablet Take 1 tablet (10 mg total) by mouth at bedtime. Patient not taking: Reported on 02/07/2016 12/07/15 12/06/16  Shela Leff, MD  ferrous gluconate (FERGON) 324 MG tablet Take 1 tablet (324 mg total) by mouth 3 (three) times daily with meals. Patient not taking: Reported on 02/07/2016 09/07/15   Alexa Angela Burke, MD  lidocaine (LIDODERM) 5 % Place 1 patch onto the skin daily. Remove & Discard patch within 12 hours or as directed by MD Patient not taking: Reported on 02/07/2016 12/11/15   Alexa Angela Burke, MD  methylPREDNISolone (MEDROL DOSEPAK) 4 MG TBPK tablet Take as directed Patient not taking: Reported on 02/07/2016 12/20/15   Meredith Staggers, MD  naloxone [facility]) 0.4 MG/ML injection Inject 1 mL (0.4 mg total) into the vein as needed. Patient not taking: Reported on 02/07/2016 09/06/15   Alexa Angela Burke, MD  senna-docusate (SENOKOT-S) 8.6-50 MG per tablet Take 2 tablets by mouth at bedtime as needed for mild constipation. Patient not taking: Reported on 02/07/2016 05/22/15   Alexa Angela Burke, MD   BP 116/78 mmHg  Pulse 71  Temp(Src) 98.2 F (36.8 C) (Oral)  Resp 16  SpO2 95% Physical Exam  Constitutional: She appears well-developed and well-nourished. No distress.   Nontoxic-appearing  HENT:  Head: Normocephalic and atraumatic.  Right Ear: External ear normal.  Left Ear: External ear normal.  Mouth/Throat: Uvula is midline and oropharynx is clear and moist. Mucous membranes are not dry. No trismus in the jaw. No dental abscesses or uvula swelling.  Poor dentition throughout with multiple teeth missing. Severe decay of right lower bicuspid. No erythema or drainage from the surrounding gumline. No obvious dental abscess. No trismus. No erythema or soft tissue swelling of the cheek.   Eyes: Conjunctivae are normal. Right eye exhibits no discharge. Left eye exhibits no discharge. No scleral icterus.  Neck: Normal range of motion. Neck supple.  FROM of neck intact without pain. No TTP. No cervical adenopathy. No soft tissue swelling of the neck.   Cardiovascular: Normal rate and regular rhythm.   Pulmonary/Chest: Effort normal. No stridor. No respiratory distress. She has no decreased breath sounds. She exhibits tenderness. She exhibits no swelling.    Small, well-healing incision with nylon sutures in place at the left anterior chest wall at the midclavicular line. Faint erythema and ecchymosis at the incision site without streaking. No purulent discharge. No warmth. Generalized tenderness to palpation around the surgical site without bony deformity. Breathing unlabored. Good air movement in all lung fields  Abdominal: Soft. She exhibits no distension. There is no tenderness.  Musculoskeletal: Normal range of motion.  Lymphadenopathy:    She has no cervical adenopathy.  Neurological: She is alert. Coordination normal.  Skin: Skin is warm and dry.  Psychiatric: She has a normal mood and affect. Her behavior is normal.  Nursing note and vitals reviewed.   ED Course  Procedures (including critical care time) Labs Review Labs Reviewed - No data to display  Imaging Review Dg Chest 2 View  02/07/2016  CLINICAL DATA:  LEFT pneumothorax 6 days ago, still  having LEFT chest pain and shortness of breath, history smoking, hypertension, back surgery, COPD EXAM: CHEST  2 VIEW COMPARISON:  02/04/2016 FINDINGS: Normal heart size, mediastinal contours, and pulmonary vascularity. Slight rotation to the RIGHT. Chronic bronchitic changes without infiltrate, pleural effusion, or pneumothorax. Prior thoracolumbar spinal fixation. No acute osseous findings. IMPRESSION: Chronic bronchitic changes. No acute abnormalities or pneumothorax seen. Electronically Signed   By: Lavonia Dana M.D.   On: 02/07/2016 12:40   I have personally reviewed and evaluated these images and lab results as part of my medical decision-making.   EKG Interpretation None      MDM   Final diagnoses:  Pain, dental  Visit for wound check  Shortness of breath   60 year old female presenting with dental pain, shortness of breath and for a wound check after chest tube removal 3 days ago. Afebrile and hemodynamically stable. Patient is nontoxic-appearing. Well-healing chest tube surgical site noted to left anterior chest wall. Mild generalized tenderness around the wound. No signs of infection. Good air movement in all lung fields. Breathing is unlabored patient is in no respiratory distress. Chest x-ray negative for pneumothorax. Poor dentition throughout. No obvious abscess on exam. No soft tissue swelling of the cheek or neck. Exam on concerning for a bleed weeks angina or spread of infection. Dental pain controlled with a dental block. Patient has a chronic pain syndrome and receives fentanyl patches. Patient will not be discharged with further pain medications. Will discharge with clindamycin given patient's amoxicillin allergy. Encouraged patient to keep her follow-up appointments with her dentist and cardiothoracic surgeon.  At this time there does not appear to be any evidence of an acute emergency medical condition and the patient appears  stable for discharge with appropriate outpatient  follow up. Diagnosis was discussed with patient who verbalizes understanding and is agreeable to discharge. Pt case discussed with Dr. Laneta Simmers who agrees with my plan. Return precautions given in discharge paperwork and discussed with pt at bedside. Pt is stable for discharge.       Josephina Gip, PA-C 02/07/16 1412  Leo Grosser, MD 02/07/16 [phone]

## 2016-02-07 NOTE — Progress Notes (Signed)
Utilization review completed- post discharge 

## 2016-02-08 ENCOUNTER — Ambulatory Visit: Payer: Medicare Other | Admitting: Registered Nurse

## 2016-02-09 ENCOUNTER — Other Ambulatory Visit: Payer: Self-pay | Admitting: Physical Medicine & Rehabilitation

## 2016-02-10 ENCOUNTER — Other Ambulatory Visit: Payer: Self-pay | Admitting: Registered Nurse

## 2016-02-13 ENCOUNTER — Other Ambulatory Visit: Payer: Self-pay | Admitting: Cardiothoracic Surgery

## 2016-02-13 DIAGNOSIS — J939 Pneumothorax, unspecified: Secondary | ICD-10-CM

## 2016-02-14 ENCOUNTER — Ambulatory Visit: Payer: Medicare Other | Admitting: Registered Nurse

## 2016-02-14 ENCOUNTER — Encounter: Payer: Self-pay | Admitting: Cardiothoracic Surgery

## 2016-02-14 ENCOUNTER — Ambulatory Visit
Admission: RE | Admit: 2016-02-14 | Discharge: 2016-02-14 | Disposition: A | Discharge status: Home / Self Care | Payer: Medicare Other | Source: Ambulatory Visit | Attending: Cardiothoracic Surgery | Admitting: Cardiothoracic Surgery

## 2016-02-14 ENCOUNTER — Ambulatory Visit (INDEPENDENT_AMBULATORY_CARE_PROVIDER_SITE_OTHER): Payer: Medicare Other | Admitting: Cardiothoracic Surgery

## 2016-02-14 VITALS — BP 127/84 | HR 107 | Resp 16 | Ht 64.0 in | Wt 144.0 lb

## 2016-02-14 DIAGNOSIS — Z938 Other artificial opening status: Secondary | ICD-10-CM | POA: Diagnosis not present

## 2016-02-14 DIAGNOSIS — J939 Pneumothorax, unspecified: Secondary | ICD-10-CM

## 2016-02-14 DIAGNOSIS — Z4682 Encounter for fitting and adjustment of non-vascular catheter: Secondary | ICD-10-CM | POA: Diagnosis not present

## 2016-02-14 NOTE — Progress Notes (Signed)
PCP is Florinda Marker, MD Referring Provider is Florinda Marker, MD  Chief Complaint  Patient presents with  . Spontaneous Pneumothorax    left...f/u after chest tube placement 01/31/16 WITH A CXR    HPI: Scheduled visit following hospitalization for iatrogenic left pneumothorax after subcostal injection for pain. Chest x-ray today is clear. Chest tube sutures removed. There is some slight erythema around the incision site. This was cauterized with silver nitrate locally and a Neosporin Band-Aid dressing applied. Breath sounds are clear and equal.  The chest tube site is still very tender and painful. The patient is taking narcotics and wishes a few more. I feel this is reasonable and a prescription for hydrocodone 10 mg was provided the patient.  Past Medical History  Diagnosis Date  . Postlaminectomy syndrome, thoracic region   . Osteoarthrosis, unspecified whether generalized or localized, lower leg   . Dysthymic disorder   . Calcifying tendinitis of shoulder   . Pain in joint, upper arm   . Chronic pain syndrome   . Lumbago   . Primary localized osteoarthrosis, lower leg   . Hypertension   . Hyperlipidemia   . GERD (gastroesophageal reflux disease)   . Thyroid disease   . Restless leg syndrome   . History of blood transfusion 1980    related to "back surgery"  . Hypothyroidism   . Active smoker   . Heart murmur     for years, nothing to be concerned about  . Sleep apnea     s/p surgery- last sleep study 2011- doesnt use oxygen or machine at night as instructed,.   12/2014- Dr Halford Chessman  reports it is negative.  [name] Anxiety   . Herpes genitalia   . PONV (postoperative nausea and vomiting)     gets nauseous  with longer surgery. Difficuty voiding after surgery  . COPD (chronic obstructive pulmonary disease) ([place])   . Emphysema lung ([place])   . Headache     "weekly maybe" (01/31/2016)  . Chronic back pain   . Pneumothorax, left 01/31/2016    S/P Left posterior subcostal pain  injection on 01/30/2016    Past Surgical History  Procedure Laterality Date  . Appendectomy    . Vaginal hysterectomy    . Tubal ligation    . Total hip arthroplasty Right   . Knee arthroscopy Right   . Hammer toe surgery    . Uvulopalatopharyngoplasty    . Total knee arthroplasty  05/29/2012    Procedure: TOTAL KNEE ARTHROPLASTY;  Surgeon: Mcarthur Rossetti, MD;  Location: [facility];  Service: Orthopedics;  Laterality: Right;  Right Total Knee Arthroplasty  . Lumbar laminectomy/decompression microdiscectomy N/A 01/28/2014    Procedure: Minimally Invasive Right  L1-2 Microdiscectomy;  Surgeon: [name], MD;  Location: [facility];  Service: Orthopedics;  Laterality: N/A;  . Joint replacement    . Back surgery      18 back surgeries (2 thoracic & 16 lumbar) (01/31/2016)  . Lumbar fusion N/A 08/01/2015    Procedure: Right sided L1-2 and L2-3 transforaminal lumbar interbody fusion with cages, Extension of posterior fusion T12 to L3, Replaced pedicle screws bilaterally L1-L2 , Replaced left sided pedicle screws L-3. Instrumentation T12 to L3 using local bone graft, Vivigen allograft and cancellous chips;  Surgeon: [name], MD;  Location: [facility];  Service: Orthopedics;  Laterality: N/A;  . Laparoscopic cholecystectomy    . Tonsillectomy    . Dilation and curettage of uterus  Family History  Problem Relation Age of Onset  . Kidney disease Mother   . Heart disease Father   . Anuerysm Brother 29    brain  . Heart disease Brother   . Heart disease Sister 93    s/p CABG  . Hypertension Sister     Social History Social History  Substance Use Topics  . Smoking status: Current Every Day Smoker -- 1.25 packs/day for 45 years    Types: Cigarettes  . Smokeless tobacco: Never Used  . Alcohol Use: No    Current Outpatient Prescriptions  Medication Sig Dispense Refill  . acetaminophen (TYLENOL) 500 MG tablet Take 1,000 mg by mouth every 6 (six) hours as needed for headache.     .  albuterol (PROVENTIL HFA;VENTOLIN HFA) 108 (90 BASE) MCG/ACT inhaler Inhale 1 puff into the lungs every 6 (six) hours as needed for wheezing or shortness of breath. 1 Inhaler 5  . amLODipine (NORVASC) 10 MG tablet Take 1 tablet (10 mg total) by mouth daily. 30 tablet 3  . benazepril (LOTENSIN) 40 MG tablet TAKE 1 TABLET BY MOUTH DAILY. 30 tablet 11  . carisoprodol (SOMA) 350 MG tablet Take 1 tablet (350 mg total) by mouth 3 (three) times daily. 90 tablet 1  . clindamycin (CLEOCIN) 300 MG capsule Take 1 capsule (300 mg total) by mouth 4 (four) times daily. 28 capsule 0  . clonazePAM (KLONOPIN) 2 MG tablet TAKE 1 TABLET BY MOUTH 2 TIMES A DAY AS NEEDED. 60 tablet 0  . fentaNYL (DURAGESIC - DOSED MCG/HR) 50 MCG/HR Place 1 patch (50 mcg total) onto the skin every 3 (three) days. 10 patch 0  . fluticasone (FLONASE) 50 MCG/ACT nasal spray Place 1 spray into both nostrils daily. (Patient taking differently: Place 1 spray into both nostrils daily as needed for allergies. ) 16 g 5  . gabapentin (NEURONTIN) 300 MG capsule TAKE 1 CAPSULE BY MOUTH 2 TIMES DAILY. 60 capsule 4  . meclizine (ANTIVERT) 25 MG tablet TAKE 1 TABLET BY MOUTH 2 TIMES DAILY AS NEEDED FOR NAUSEA. 60 tablet 0  . naproxen (NAPROSYN) 500 MG tablet Take 500 mg by mouth 2 (two) times daily.  4  . omeprazole (PRILOSEC) 20 MG capsule TAKE 1 CAPSULE BY MOUTH 2 TIMES DAILY BEFORE A MEAL. 60 capsule 11  . pravastatin (PRAVACHOL) 40 MG tablet TAKE 1 TABLET BY MOUTH DAILY. 90 tablet PRN  . Psyllium (VEGETABLE LAXATIVE PO) Take 2 tablets by mouth at bedtime. Reported on 10/24/2015    . QUEtiapine (SEROQUEL) 50 MG tablet Take 100 mg by mouth at bedtime.     [name] rOPINIRole (REQUIP) 0.5 MG tablet TAKE 1 TABLET BY MOUTH AT BEDTIME FOR 1 WEEK THEN INCREASE TO 2 TABLETS AT BEDTIME. (STOP PRIMIDONE) (Patient taking differently: take 1 to 2 tablets at bedtime as needed for restless legs) 60 tablet 1  . SPIRIVA HANDIHALER 18 MCG inhalation capsule PLACE 1 CAPSULE  INTO INHALER AND INHALE DAILY. 30 capsule 1  . tiZANidine (ZANAFLEX) 4 MG tablet Take 4 mg by mouth 2 (two) times daily as needed for muscle spasms. pain  3  . valACYclovir (VALTREX) 500 MG tablet TAKE 1 TABLET BY MOUTH DAILY. 30 tablet 11  . venlafaxine XR (EFFEXOR-XR) 150 MG 24 hr capsule TAKE 2 CAPSULES (300 MG TOTAL) BY MOUTH DAILY WITH BREAKFAST. (Patient taking differently: take 300mg s at bedtime) 60 capsule 11  . VOLTAREN 1 % GEL APPLY 2 GRAMS TO AFFECTED AREA 3 TIMES A DAY AS NEEDED  FOR PAIN. 300 g 3  . hydrochlorothiazide (MICROZIDE) 12.5 MG capsule Take 1 capsule (12.5 mg total) by mouth daily.    [name] HYDROcodone-acetaminophen (NORCO) 10-325 MG tablet Take 1 tablet by mouth every 6 (six) hours as needed (breakthrough pain). 120 tablet 0  . levothyroxine (SYNTHROID, LEVOTHROID) 75 MCG tablet Take 1 tablet (75 mcg total) by mouth daily before breakfast. 30 tablet 11   No current facility-administered medications for this visit.    Allergies  Allergen Reactions  . Amoxicillin Other (See Comments)    REACTION: Oral yeast infection Has patient had a PCN reaction causing immediate rash, facial/tongue/throat swelling, SOB or lightheadedness with hypotension: No Has patient had a PCN reaction causing severe rash involving mucus membranes or skin necrosis: No Has patient had a PCN reaction that required hospitalization No Has patient had a PCN reaction occurring within the last 10 years: No If all of the above answers are "NO", then may proceed with Cephalosporin use.   . Chlorzoxazone Other (See Comments)    REACTION: headache  . Codeine Other (See Comments)    REACTION: headache  . Darvocet [Propoxyphene N-Acetaminophen] Itching  . Dilaudid [Hydromorphone Hcl] Itching  . Flagyl [Metronidazole] Diarrhea  . Keflex [Cephalexin] Other (See Comments)    Pt does not recall reaction (maybe yeast infection)  . Morphine And Related Itching  . Nitrofurantoin Monohyd Macro Hives    Reaction to  [employer]  . Percocet [Oxycodone-Acetaminophen] Itching    Patient can tolerate Acetaminophen solely  . Sulfa Antibiotics Other (See Comments)    REACTION: Yeast infection in mouth    Review of Systems   No shortness of breath or coughing No fever Pain at the chest tube incision site Continues to smoke cigarettes  BP 127/84 mmHg  Pulse 107  Resp 16  Ht 5\' 4"  (1.626 m)  Wt 144 lb (65.318 kg)  BMI 24.71 kg/m2  SpO2 97% Physical Exam Alert and comfortable Breath sounds clear and equal Chest tube site clean, dry with slight erythema from the suture which has been removed Heart rhythm regular  Diagnostic Tests: Chest x-ray clear no pneumothorax  Impression: Without pneumothorax after chest tube placement on left  Plan: Wound care was discussed with the patient. Activity limits were discussed with the patient. Return as needed Len Childs, MD [facility] [phone]

## 2016-02-14 NOTE — Telephone Encounter (Signed)
Okay to refill?  please advise 

## 2016-02-15 ENCOUNTER — Ambulatory Visit: Payer: Medicare Other | Admitting: Registered Nurse

## 2016-02-15 DIAGNOSIS — M940 Chondrocostal junction syndrome [Tietze]: Secondary | ICD-10-CM | POA: Diagnosis not present

## 2016-02-15 DIAGNOSIS — G4734 Idiopathic sleep related nonobstructive alveolar hypoventilation: Secondary | ICD-10-CM | POA: Diagnosis not present

## 2016-02-16 ENCOUNTER — Other Ambulatory Visit: Payer: Self-pay | Admitting: Physical Medicine & Rehabilitation

## 2016-02-16 NOTE — Telephone Encounter (Signed)
Patient has been to see Nils Pyle again and would like to discuss medications. Please call patient back at [phone].

## 2016-02-16 NOTE — Telephone Encounter (Signed)
Spoke to [name], Ms. Dosch had an intercostal nerve block by Dr. Ernestina Patches on 01/30/2016. On 01/31/2016 she woke up with SOB, she called Dr. [name] she had an X-ray it  Revealed Left Pneumothorax. She was admitted to [facility] on 01/31/16 and discharged on 02/06/16. Ms. Winnie seen Dr. Charlaine Dalton on 02/14/16 her chest tube site was tender and painful he prescribed Hydrocodone 10mg . According to [facility] her last Hydrocodone prescription was picked up  on 01/15/16. She has an appointment on 02/20/16. She was given permission to fill the hydrocodone prescription. She verbalizes understanding.  Also states she needed her [name], according to [employer] picked up on 01/12/16, prescription called in. She is aware.

## 2016-02-20 ENCOUNTER — Encounter: Payer: Self-pay | Admitting: Registered Nurse

## 2016-02-20 ENCOUNTER — Encounter: Payer: Medicare Other | Attending: Physical Medicine & Rehabilitation | Admitting: Registered Nurse

## 2016-02-20 VITALS — BP 104/67 | HR 98 | Resp 14

## 2016-02-20 DIAGNOSIS — M961 Postlaminectomy syndrome, not elsewhere classified: Secondary | ICD-10-CM | POA: Diagnosis not present

## 2016-02-20 DIAGNOSIS — G9619 Other disorders of meninges, not elsewhere classified: Secondary | ICD-10-CM | POA: Insufficient documentation

## 2016-02-20 DIAGNOSIS — J449 Chronic obstructive pulmonary disease, unspecified: Secondary | ICD-10-CM | POA: Insufficient documentation

## 2016-02-20 DIAGNOSIS — K219 Gastro-esophageal reflux disease without esophagitis: Secondary | ICD-10-CM | POA: Insufficient documentation

## 2016-02-20 DIAGNOSIS — M791 Myalgia: Secondary | ICD-10-CM

## 2016-02-20 DIAGNOSIS — E079 Disorder of thyroid, unspecified: Secondary | ICD-10-CM | POA: Diagnosis not present

## 2016-02-20 DIAGNOSIS — Z9981 Dependence on supplemental oxygen: Secondary | ICD-10-CM | POA: Insufficient documentation

## 2016-02-20 DIAGNOSIS — M5441 Lumbago with sciatica, right side: Secondary | ICD-10-CM | POA: Diagnosis not present

## 2016-02-20 DIAGNOSIS — F32A Depression, unspecified: Secondary | ICD-10-CM

## 2016-02-20 DIAGNOSIS — M4316 Spondylolisthesis, lumbar region: Secondary | ICD-10-CM

## 2016-02-20 DIAGNOSIS — M5126 Other intervertebral disc displacement, lumbar region: Secondary | ICD-10-CM | POA: Diagnosis not present

## 2016-02-20 DIAGNOSIS — Z5181 Encounter for therapeutic drug level monitoring: Secondary | ICD-10-CM | POA: Diagnosis not present

## 2016-02-20 DIAGNOSIS — M62830 Muscle spasm of back: Secondary | ICD-10-CM | POA: Diagnosis not present

## 2016-02-20 DIAGNOSIS — F329 Major depressive disorder, single episode, unspecified: Secondary | ICD-10-CM

## 2016-02-20 DIAGNOSIS — E039 Hypothyroidism, unspecified: Secondary | ICD-10-CM | POA: Diagnosis not present

## 2016-02-20 DIAGNOSIS — M5136 Other intervertebral disc degeneration, lumbar region: Secondary | ICD-10-CM

## 2016-02-20 DIAGNOSIS — Z9889 Other specified postprocedural states: Secondary | ICD-10-CM | POA: Insufficient documentation

## 2016-02-20 DIAGNOSIS — M2170 Unequal limb length (acquired), unspecified site: Secondary | ICD-10-CM | POA: Insufficient documentation

## 2016-02-20 DIAGNOSIS — Z79899 Other long term (current) drug therapy: Secondary | ICD-10-CM | POA: Diagnosis not present

## 2016-02-20 DIAGNOSIS — M7918 Myalgia, other site: Secondary | ICD-10-CM

## 2016-02-20 DIAGNOSIS — M170 Bilateral primary osteoarthritis of knee: Secondary | ICD-10-CM | POA: Diagnosis not present

## 2016-02-20 DIAGNOSIS — G2581 Restless legs syndrome: Secondary | ICD-10-CM | POA: Diagnosis not present

## 2016-02-20 DIAGNOSIS — E785 Hyperlipidemia, unspecified: Secondary | ICD-10-CM | POA: Diagnosis not present

## 2016-02-20 DIAGNOSIS — I10 Essential (primary) hypertension: Secondary | ICD-10-CM | POA: Diagnosis not present

## 2016-02-20 DIAGNOSIS — Z720 Tobacco use: Secondary | ICD-10-CM | POA: Diagnosis not present

## 2016-02-20 DIAGNOSIS — G894 Chronic pain syndrome: Secondary | ICD-10-CM | POA: Diagnosis not present

## 2016-02-20 MED ORDER — FENTANYL 50 MCG/HR TD PT72: 50.0000 ug | MEDICATED_PATCH | TRANSDERMAL | Status: DC

## 2016-02-20 MED ORDER — HYDROCODONE-ACETAMINOPHEN 10-325 MG PO TABS: 1.0000 | ORAL_TABLET | Freq: Four times a day (QID) | ORAL | Status: DC | PRN

## 2016-02-20 NOTE — Progress Notes (Signed)
Subjective:    Patient ID: Jill Phillips, female    DOB: 1956-03-15, 60 y.o.   MRN: [id number]  HPI: [name] is a 60 year old female who returns for follow up for chronic pain and medication refill. She states her pain is located in her mid- back and right foot numbness. Also has incision site tenderness, erythema noted without drainage. Her current exercise regime is walking.   On 02/16/2016 I spoke with [name]: she had an intercostal nerve block by Dr. Ernestina Patches on 01/30/2016. On 01/30/2016 she woke up with SOB, she called Dr. [name]. He ordered a chest X-ray is showed she had a Left Pneumothorax. She was admitted to [facility] on 01/31/16 and discharged on 02/06/16. She had a F/U appointment with Dr. Nils Pyle on 02/14/16.   Pain Inventory Average Pain 8 Pain Right Now 7 My pain is no selection  In the last 24 hours, has pain interfered with the following? General activity 10 Relation with others 10 Enjoyment of life 10 What TIME of day is your pain at its worst? all Sleep (in general) Fair  Pain is worse with: unsure Pain improves with: medication Relief from Meds: 8  Mobility Do you have any goals in this area?  no  Function Do you have any goals in this area?  no  Neuro/Psych numbness spasms  Prior Studies Any changes since last visit?  no  Physicians involved in your care Any changes since last visit?  no   Family History  Problem Relation Age of Onset  . Kidney disease Mother   . Heart disease Father   . Anuerysm Brother 29    brain  . Heart disease Brother   . Heart disease Sister 60    s/p CABG  . Hypertension Sister    Social History   Social History  . Marital Status: Divorced    Spouse Name: n/a  . Number of Children: 2  . Years of Education: 12+   Occupational History  . disability     back surgeries   Social History Main Topics  . Smoking status: Current Every Day Smoker -- 1.25 packs/day for 45 years    Types: Cigarettes  .  Smokeless tobacco: Never Used  . Alcohol Use: No  . Drug Use: Yes    Special: Marijuana     Comment: 01/31/2016 "none since ~ 1980"  . Sexual Activity: No   Other Topics Concern  . None   Social History Narrative   Lives alone.  One daughter is local, but is getting ready to move to [place], where her children live with their father.  The other daughter lives near [place], [place].   Past Surgical History  Procedure Laterality Date  . Appendectomy    . Vaginal hysterectomy    . Tubal ligation    . Total hip arthroplasty Right   . Knee arthroscopy Right   . Hammer toe surgery    . Uvulopalatopharyngoplasty    . Total knee arthroplasty  05/29/2012    Procedure: TOTAL KNEE ARTHROPLASTY;  Surgeon: Mcarthur Rossetti, MD;  Location: [facility];  Service: Orthopedics;  Laterality: Right;  Right Total Knee Arthroplasty  . Lumbar laminectomy/decompression microdiscectomy N/A 01/28/2014    Procedure: Minimally Invasive Right  L1-2 Microdiscectomy;  Surgeon: [name], MD;  Location: [facility];  Service: Orthopedics;  Laterality: N/A;  . Joint replacement    . Back surgery      18 back surgeries (  2 thoracic & 16 lumbar) (01/31/2016)  . Lumbar fusion N/A 08/01/2015    Procedure: Right sided L1-2 and L2-3 transforaminal lumbar interbody fusion with cages, Extension of posterior fusion T12 to L3, Replaced pedicle screws bilaterally L1-L2 , Replaced left sided pedicle screws L-3. Instrumentation T12 to L3 using local bone graft, Vivigen allograft and cancellous chips;  Surgeon: [name], MD;  Location: [place];  Service: Orthopedics;  Laterality: N/A;  . Laparoscopic cholecystectomy    . Tonsillectomy    . Dilation and curettage of uterus     Past Medical History  Diagnosis Date  . Postlaminectomy syndrome, thoracic region   . Osteoarthrosis, unspecified whether generalized or localized, lower leg   . Dysthymic disorder   . Calcifying tendinitis of shoulder   . Pain in joint, upper arm   .  Chronic pain syndrome   . Lumbago   . Primary localized osteoarthrosis, lower leg   . Hypertension   . Hyperlipidemia   . GERD (gastroesophageal reflux disease)   . Thyroid disease   . Restless leg syndrome   . History of blood transfusion 1980    related to "back surgery"  . Hypothyroidism   . Active smoker   . Heart murmur     for years, nothing to be concerned about  . Sleep apnea     s/p surgery- last sleep study 2011- doesnt use oxygen or machine at night as instructed,.   12/2014- Dr Halford Chessman  reports it is negative.  [name] Anxiety   . Herpes genitalia   . PONV (postoperative nausea and vomiting)     gets nauseous  with longer surgery. Difficuty voiding after surgery  . COPD (chronic obstructive pulmonary disease) ([place])   . Emphysema lung ([place])   . Headache     "weekly maybe" (01/31/2016)  . Chronic back pain   . Pneumothorax, left 01/31/2016    S/P Left posterior subcostal pain injection on 01/30/2016   BP 104/67 mmHg  Pulse 98  Resp 14  SpO2 94%  Opioid Risk Score:   Fall Risk Score:  `1  Depression screen PHQ 2/9  Depression screen [facility] 2/9 12/07/2015 11/15/2015 10/13/2015 09/06/2015 05/22/2015 02/15/2015 11/24/2014  Decreased Interest 0 0 0 0 0 0 0  Down, Depressed, Hopeless 0 0 - 0 0 0 0  PHQ - 2 Score 0 0 0 0 0 0 0  Altered sleeping - - - - - - 3  Tired, decreased energy - - - - - - 3  Change in appetite - - - - - - 1  Feeling bad or failure about yourself  - - - - - - 0  Trouble concentrating - - - - - - 0  Moving slowly or fidgety/restless - - - - - - 0  Suicidal thoughts - - - - - - 0  PHQ-9 Score - - - - - - 7     Review of Systems  Constitutional: Negative for appetite change.  HENT: Negative for congestion.   Eyes: Negative for discharge.  Respiratory: Negative for apnea.   Cardiovascular: Negative for chest pain.  Gastrointestinal: Negative for abdominal distention.  Endocrine: Negative for cold intolerance.  Genitourinary: Negative for difficulty urinating.    Musculoskeletal: Positive for back pain.       Spasms   Skin: Negative for rash.  Neurological: Positive for numbness.  Hematological: Does not bruise/bleed easily.  Psychiatric/Behavioral: Negative for agitation.  All other systems reviewed and are negative.  Objective:   Physical Exam  Constitutional: She is oriented to person, place, and time. She appears well-developed and well-nourished.  HENT:  Head: Normocephalic and atraumatic.  Neck: Normal range of motion. Neck supple.  Cardiovascular: Normal rate and regular rhythm.   Pulmonary/Chest: Effort normal and breath sounds normal.  Musculoskeletal:  Normal Muscle Bulk and Muscle Testing Reveals: Upper Extremities: Full ROM and Muscle Strength 5/5 Thoracic Paraspinal Tenderness: T-7- T-9 Lower Extremities: Full ROM and Muscle Strength 5/5 Arises from table slowly Normal Based Gait  Neurological: She is alert and oriented to person, place, and time.  Skin: Skin is warm and dry.  Psychiatric: She has a normal mood and affect.  Nursing note and vitals reviewed.         Assessment & Plan:  1. Chronic lumbar spine pain/post-lami syndrome: Refilled: Fentanyl 50 MCG one patch every three days #10 and Hydrocodone 10/325 mg one tablet every 6 hours as needed for pain #120. We will continue the opioid monitoring program, this consists of regular clinic visits, examinations, urine drug screen, pill counts as well as use of [place] Controlled Substance Reporting System. 2. Osteoarthritis of the knees bilaterally:No complaints today. Continue with Heat, exercise and voltaren gel.  3. Rotator cuff syndrome/subacromial bursitis: No complaints voiced today. 4. Restless legs syndrome: Continue Requip.Continue to monitor 5. Tobacco Abuse: Encourage Smoking Cessation: Pulmonologist Following 6. Emphysema: Pulmonology Following  20 minutes of face to face patient care time was spent during this visit. All questions were  encouraged and answered.   F/u in 1 month

## 2016-03-06 ENCOUNTER — Other Ambulatory Visit: Payer: Self-pay | Admitting: Physical Medicine & Rehabilitation

## 2016-03-06 NOTE — Telephone Encounter (Signed)
Pt.notified

## 2016-03-06 NOTE — Telephone Encounter (Signed)
Last OV 02/20/2016 Last filled 02/06/2016

## 2016-03-16 DIAGNOSIS — G4734 Idiopathic sleep related nonobstructive alveolar hypoventilation: Secondary | ICD-10-CM | POA: Diagnosis not present

## 2016-03-19 ENCOUNTER — Encounter: Payer: Self-pay | Admitting: Registered Nurse

## 2016-03-19 ENCOUNTER — Encounter: Payer: Medicare Other | Attending: Physical Medicine & Rehabilitation | Admitting: Registered Nurse

## 2016-03-19 VITALS — BP 131/85 | HR 99

## 2016-03-19 DIAGNOSIS — M4316 Spondylolisthesis, lumbar region: Secondary | ICD-10-CM

## 2016-03-19 DIAGNOSIS — G894 Chronic pain syndrome: Secondary | ICD-10-CM | POA: Diagnosis not present

## 2016-03-19 DIAGNOSIS — G9619 Other disorders of meninges, not elsewhere classified: Secondary | ICD-10-CM | POA: Diagnosis not present

## 2016-03-19 DIAGNOSIS — I10 Essential (primary) hypertension: Secondary | ICD-10-CM | POA: Diagnosis not present

## 2016-03-19 DIAGNOSIS — M2170 Unequal limb length (acquired), unspecified site: Secondary | ICD-10-CM | POA: Insufficient documentation

## 2016-03-19 DIAGNOSIS — Z79899 Other long term (current) drug therapy: Secondary | ICD-10-CM | POA: Diagnosis not present

## 2016-03-19 DIAGNOSIS — R0781 Pleurodynia: Secondary | ICD-10-CM | POA: Diagnosis not present

## 2016-03-19 DIAGNOSIS — J449 Chronic obstructive pulmonary disease, unspecified: Secondary | ICD-10-CM | POA: Insufficient documentation

## 2016-03-19 DIAGNOSIS — M170 Bilateral primary osteoarthritis of knee: Secondary | ICD-10-CM | POA: Insufficient documentation

## 2016-03-19 DIAGNOSIS — Z720 Tobacco use: Secondary | ICD-10-CM | POA: Diagnosis not present

## 2016-03-19 DIAGNOSIS — E785 Hyperlipidemia, unspecified: Secondary | ICD-10-CM | POA: Diagnosis not present

## 2016-03-19 DIAGNOSIS — M791 Myalgia: Secondary | ICD-10-CM

## 2016-03-19 DIAGNOSIS — Z9981 Dependence on supplemental oxygen: Secondary | ICD-10-CM | POA: Insufficient documentation

## 2016-03-19 DIAGNOSIS — M961 Postlaminectomy syndrome, not elsewhere classified: Secondary | ICD-10-CM | POA: Insufficient documentation

## 2016-03-19 DIAGNOSIS — E079 Disorder of thyroid, unspecified: Secondary | ICD-10-CM | POA: Insufficient documentation

## 2016-03-19 DIAGNOSIS — M5126 Other intervertebral disc displacement, lumbar region: Secondary | ICD-10-CM | POA: Insufficient documentation

## 2016-03-19 DIAGNOSIS — M62830 Muscle spasm of back: Secondary | ICD-10-CM | POA: Diagnosis not present

## 2016-03-19 DIAGNOSIS — E039 Hypothyroidism, unspecified: Secondary | ICD-10-CM | POA: Insufficient documentation

## 2016-03-19 DIAGNOSIS — G2581 Restless legs syndrome: Secondary | ICD-10-CM | POA: Insufficient documentation

## 2016-03-19 DIAGNOSIS — M5136 Other intervertebral disc degeneration, lumbar region: Secondary | ICD-10-CM

## 2016-03-19 DIAGNOSIS — K219 Gastro-esophageal reflux disease without esophagitis: Secondary | ICD-10-CM | POA: Diagnosis not present

## 2016-03-19 DIAGNOSIS — Z5181 Encounter for therapeutic drug level monitoring: Secondary | ICD-10-CM

## 2016-03-19 DIAGNOSIS — Z9889 Other specified postprocedural states: Secondary | ICD-10-CM | POA: Insufficient documentation

## 2016-03-19 DIAGNOSIS — M7918 Myalgia, other site: Secondary | ICD-10-CM

## 2016-03-19 MED ORDER — FENTANYL 50 MCG/HR TD PT72
50.0000 ug | MEDICATED_PATCH | TRANSDERMAL | Status: DC
Start: 2016-03-19 — End: 2016-04-17

## 2016-03-19 MED ORDER — HYDROCODONE-ACETAMINOPHEN 10-325 MG PO TABS: 1.0000 | ORAL_TABLET | Freq: Four times a day (QID) | ORAL | Status: DC | PRN

## 2016-03-19 NOTE — Progress Notes (Signed)
Subjective:    Patient ID: Jill Phillips, female    DOB: [birth year], 60 y.o.   MRN: [id number]  HPI: Jill Phillips is a 60 year old female who returns for follow up for chronic pain and medication refill. She states her pain is located in her mid- lower back, left rib pain and complaining about her restless leg.  Medications reviewed she is currently on Requip and Gabapentin, she verbalizes understanding. Her current exercise regime is walking.  Pain Inventory Average Pain 7 Pain Right Now 7 My pain is dull  In the last 24 hours, has pain interfered with the following? General activity 6 Relation with others 5 Enjoyment of life 6 What TIME of day is your pain at its worst? daytime and evening Sleep (in general) Fair  Pain is worse with: walking, standing and some activites Pain improves with: rest and medication Relief from Meds: 7  Mobility walk without assistance how many minutes can you walk? 15 ability to climb steps?  yes do you drive?  no  Function disabled: date disabled . I need assistance with the following:  meal prep, household duties and shopping  Neuro/Psych trouble walking spasms  Prior Studies Any changes since last visit?  no  Physicians involved in your care Any changes since last visit?  no   Family History  Problem Relation Age of Onset  . Kidney disease Mother   . Heart disease Father   . Anuerysm Brother 29    brain  . Heart disease Brother   . Heart disease Sister 49    s/p CABG  . Hypertension Sister    Social History   Social History  . Marital Status: Divorced    Spouse Name: n/a  . Number of Children: 2  . Years of Education: 12+   Occupational History  . disability     back surgeries   Social History Main Topics  . Smoking status: Current Every Day Smoker -- 1.25 packs/day for 45 years    Types: Cigarettes  . Smokeless tobacco: Never Used  . Alcohol Use: No  . Drug Use: Yes    Special: Marijuana     Comment:  01/31/2016 "none since ~ 1980"  . Sexual Activity: No   Other Topics Concern  . None   Social History Narrative   Lives alone.  One daughter is local, but is getting ready to move to [place], where her children live with their father.  The other daughter lives near [place], [place].   Past Surgical History  Procedure Laterality Date  . Appendectomy    . Vaginal hysterectomy    . Tubal ligation    . Total hip arthroplasty Right   . Knee arthroscopy Right   . Hammer toe surgery    . Uvulopalatopharyngoplasty    . Total knee arthroplasty  05/29/2012    Procedure: TOTAL KNEE ARTHROPLASTY;  Surgeon: Mcarthur Rossetti, MD;  Location: [facility];  Service: Orthopedics;  Laterality: Right;  Right Total Knee Arthroplasty  . Lumbar laminectomy/decompression microdiscectomy N/A 01/28/2014    Procedure: Minimally Invasive Right  L1-2 Microdiscectomy;  Surgeon: [name], MD;  Location: [place];  Service: Orthopedics;  Laterality: N/A;  . Joint replacement    . Back surgery      18 back surgeries (2 thoracic & 16 lumbar) (01/31/2016)  . Lumbar fusion N/A 08/01/2015    Procedure: Right sided L1-2 and L2-3 transforaminal lumbar interbody fusion with cages, Extension of posterior fusion  T12 to L3, Replaced pedicle screws bilaterally L1-L2 , Replaced left sided pedicle screws L-3. Instrumentation T12 to L3 using local bone graft, Vivigen allograft and cancellous chips;  Surgeon: [name], MD;  Location: [facility];  Service: Orthopedics;  Laterality: N/A;  . Laparoscopic cholecystectomy    . Tonsillectomy    . Dilation and curettage of uterus     Past Medical History  Diagnosis Date  . Postlaminectomy syndrome, thoracic region   . Osteoarthrosis, unspecified whether generalized or localized, lower leg   . Dysthymic disorder   . Calcifying tendinitis of shoulder   . Pain in joint, upper arm   . Chronic pain syndrome   . Lumbago   . Primary localized osteoarthrosis, lower leg   . Hypertension   .  Hyperlipidemia   . GERD (gastroesophageal reflux disease)   . Thyroid disease   . Restless leg syndrome   . History of blood transfusion 1980    related to "back surgery"  . Hypothyroidism   . Active smoker   . Heart murmur     for years, nothing to be concerned about  . Sleep apnea     s/p surgery- last sleep study 2011- doesnt use oxygen or machine at night as instructed,.   12/2014- Dr Halford Chessman  reports it is negative.  [name] Anxiety   . Herpes genitalia   . PONV (postoperative nausea and vomiting)     gets nauseous  with longer surgery. Difficuty voiding after surgery  . COPD (chronic obstructive pulmonary disease) ([place])   . Emphysema lung ([place])   . Headache     "weekly maybe" (01/31/2016)  . Chronic back pain   . Pneumothorax, left 01/31/2016    S/P Left posterior subcostal pain injection on 01/30/2016   BP 131/85 mmHg  Pulse 99  SpO2 95%  Opioid Risk Score:   Fall Risk Score:  `1  Depression screen PHQ 2/9  Depression screen [facility] 2/9 12/07/2015 11/15/2015 10/13/2015 09/06/2015 05/22/2015 02/15/2015 11/24/2014  Decreased Interest 0 0 0 0 0 0 0  Down, Depressed, Hopeless 0 0 - 0 0 0 0  PHQ - 2 Score 0 0 0 0 0 0 0  Altered sleeping - - - - - - 3  Tired, decreased energy - - - - - - 3  Change in appetite - - - - - - 1  Feeling bad or failure about yourself  - - - - - - 0  Trouble concentrating - - - - - - 0  Moving slowly or fidgety/restless - - - - - - 0  Suicidal thoughts - - - - - - 0  PHQ-9 Score - - - - - - 7      Review of Systems  All other systems reviewed and are negative.      Objective:   Physical Exam  Constitutional: She is oriented to person, place, and time. She appears well-developed and well-nourished.  HENT:  Head: Normocephalic and atraumatic.  Neck: Normal range of motion. Neck supple.  Cardiovascular: Normal rate and regular rhythm.   Pulmonary/Chest: Effort normal and breath sounds normal.  Musculoskeletal:  Normal Muscle Bulk and Muscle Testing  Reveals: Upper Extremities: Full ROM and Muscle Strength 5/5 Thoracic Paraspinal Tenderness: T-7- T-9 Mainly Left Side Lumbar Paraspinal Tenderness: L-3- L-5 Lower Extremities: Full ROM and Muscle Strength 5/5 Arises from table with ease Narrow Based Gait  Neurological: She is alert and oriented to person, place, and time.  Skin: Skin is  warm and dry.  Psychiatric: She has a normal mood and affect.  Nursing note and vitals reviewed.         Assessment & Plan:  1. Chronic lumbar spine pain/post-lami syndrome: Refilled: Fentanyl 50 MCG one patch every three days #10 and Hydrocodone 10/325 mg one tablet every 6 hours as needed for pain #120. We will continue the opioid monitoring program, this consists of regular clinic visits, examinations, urine drug screen, pill counts as well as use of [place] Controlled Substance Reporting System. 2. Osteoarthritis of the knees bilaterally:No complaints today. Continue with Heat, exercise and voltaren gel.  3. Rotator cuff syndrome/subacromial bursitis: No complaints voiced today. 4. Restless legs syndrome: Continue Requip.Continue to monitor 5. Tobacco Abuse: Encourage Smoking Cessation: Pulmonologist Following 6. Emphysema: Pulmonology Following  20 minutes of face to face patient care time was spent during this visit. All questions were encouraged and answered.   F/u in 1 month

## 2016-03-21 DIAGNOSIS — M1711 Unilateral primary osteoarthritis, right knee: Secondary | ICD-10-CM | POA: Diagnosis not present

## 2016-03-22 ENCOUNTER — Telehealth: Payer: Self-pay

## 2016-03-22 NOTE — Telephone Encounter (Signed)
Pt is wanting a new medication for her restless legs. Pt C/O constant pain in her legs throughout the day. Please advise.

## 2016-03-25 NOTE — Telephone Encounter (Signed)
Placed a call to [name], no answer. Left message to return the call.

## 2016-03-27 ENCOUNTER — Other Ambulatory Visit: Payer: Self-pay | Admitting: Physical Medicine & Rehabilitation

## 2016-03-28 ENCOUNTER — Encounter: Payer: Medicare Other | Admitting: Diagnostic Neuroimaging

## 2016-03-28 NOTE — Telephone Encounter (Deleted)
Patient is calling to let us know the medication for her restless legs isn't working.  Also, she would like to see if Dr. Naaman Plummer would write a prescription for her sleeping pills.  Her psychiatrist is out of town and she can't get them.  Please call her back at [phone].

## 2016-03-28 NOTE — Telephone Encounter (Signed)
Patient is calling to let us know the medication for her restless legs isn't working.  Also, she would like to see if Dr. Naaman Plummer would write a prescription for her sleeping pills.  Her psychiatrist is out of town and she can't get them.  Please call her back at [phone].

## 2016-03-29 ENCOUNTER — Encounter: Payer: Self-pay | Admitting: Diagnostic Neuroimaging

## 2016-04-02 ENCOUNTER — Ambulatory Visit: Payer: Medicare Other | Admitting: Internal Medicine

## 2016-04-03 ENCOUNTER — Telehealth: Payer: Self-pay

## 2016-04-03 MED ORDER — QUETIAPINE FUMARATE 100 MG PO TABS: 100.0000 mg | ORAL_TABLET | Freq: Every day | ORAL | 1 refills | Status: DC

## 2016-04-03 NOTE — Telephone Encounter (Signed)
Pt is not willing to taper off of Klonopin. When I asked the pt what kind of relief she gets from the [place], she states that it does work...and then dropped the subject. When I asked the pt if she was willing to try the sinemet, she stated that she just did not want to come off the Klonopin.   She states that she would like her Seroquel to be refilled by [facility] because she is no longer seeing a psychiatrist. The psychiatrist she was seeing is doing in-patient therapy only according to the pt. She would like a referral for a new psychiatrist and is requesting [facility] to take over Seroquel until this can be completed. Please advise?   If Seroquel is being filled, pt is requesting [facility] on [facility].

## 2016-04-03 NOTE — Telephone Encounter (Signed)
Klonopin is being used for restless leg. If it's not working and she wants something else, this medicine needs to be weaned down. If she wishes to proceed with that plan, we can drop it to 2mg  for one week, then 1mg  for one week and 0.5mg  for one week then off.  IF----that is done, she can begin sinemet 10/100 QHS (#30 3RF) in the second week of the klonopin taper  [facility]

## 2016-04-03 NOTE — Telephone Encounter (Signed)
I refilled seroquel---gave her second rf.   Needs new psychiatrist.  Can we make a referral to behavioral health.  What are her options given her insurance?

## 2016-04-03 NOTE — Telephone Encounter (Signed)
Pt would like a refill on her Klonopin. However, I do not see documentation of this in the last few OV notes with [facility]. Please advise on refill? Also, she would like to try a new medication for her restless legs. Please advise.

## 2016-04-04 MED ORDER — CLONAZEPAM 2 MG PO TABS: ORAL_TABLET | ORAL | 0 refills | Status: DC

## 2016-04-04 NOTE — Telephone Encounter (Signed)
Refill phoned in, pt notified

## 2016-04-04 NOTE — Telephone Encounter (Signed)
Patient wanted to thank Dr. Naaman Plummer for filling her Seroquel.  She does need a refill on Klonopin.  She would like this done before 1:00 so her pharmacy can deliver it.  Any questions please call her at [phone].

## 2016-04-09 ENCOUNTER — Other Ambulatory Visit: Payer: Self-pay | Admitting: Pulmonary Disease

## 2016-04-09 NOTE — Telephone Encounter (Signed)
Pharmacy requesting ProAir refill for pt. Advised pharmacy that one refill will be sent and pt needs to schedule OV for further refills. Nothing further needed.

## 2016-04-16 DIAGNOSIS — G4734 Idiopathic sleep related nonobstructive alveolar hypoventilation: Secondary | ICD-10-CM | POA: Diagnosis not present

## 2016-04-17 ENCOUNTER — Encounter: Payer: Self-pay | Admitting: Registered Nurse

## 2016-04-17 ENCOUNTER — Encounter: Payer: Medicare Other | Attending: Physical Medicine & Rehabilitation | Admitting: Registered Nurse

## 2016-04-17 VITALS — BP 108/71 | HR 98 | Resp 14

## 2016-04-17 DIAGNOSIS — J449 Chronic obstructive pulmonary disease, unspecified: Secondary | ICD-10-CM | POA: Diagnosis not present

## 2016-04-17 DIAGNOSIS — G894 Chronic pain syndrome: Secondary | ICD-10-CM | POA: Diagnosis not present

## 2016-04-17 DIAGNOSIS — M170 Bilateral primary osteoarthritis of knee: Secondary | ICD-10-CM | POA: Insufficient documentation

## 2016-04-17 DIAGNOSIS — M5136 Other intervertebral disc degeneration, lumbar region: Secondary | ICD-10-CM

## 2016-04-17 DIAGNOSIS — M5126 Other intervertebral disc displacement, lumbar region: Secondary | ICD-10-CM | POA: Insufficient documentation

## 2016-04-17 DIAGNOSIS — R0781 Pleurodynia: Secondary | ICD-10-CM | POA: Diagnosis not present

## 2016-04-17 DIAGNOSIS — M961 Postlaminectomy syndrome, not elsewhere classified: Secondary | ICD-10-CM

## 2016-04-17 DIAGNOSIS — G9619 Other disorders of meninges, not elsewhere classified: Secondary | ICD-10-CM | POA: Diagnosis not present

## 2016-04-17 DIAGNOSIS — M2170 Unequal limb length (acquired), unspecified site: Secondary | ICD-10-CM | POA: Insufficient documentation

## 2016-04-17 DIAGNOSIS — K219 Gastro-esophageal reflux disease without esophagitis: Secondary | ICD-10-CM | POA: Diagnosis not present

## 2016-04-17 DIAGNOSIS — Z720 Tobacco use: Secondary | ICD-10-CM | POA: Insufficient documentation

## 2016-04-17 DIAGNOSIS — M7918 Myalgia, other site: Secondary | ICD-10-CM

## 2016-04-17 DIAGNOSIS — I10 Essential (primary) hypertension: Secondary | ICD-10-CM | POA: Diagnosis not present

## 2016-04-17 DIAGNOSIS — M62830 Muscle spasm of back: Secondary | ICD-10-CM | POA: Diagnosis not present

## 2016-04-17 DIAGNOSIS — E785 Hyperlipidemia, unspecified: Secondary | ICD-10-CM | POA: Insufficient documentation

## 2016-04-17 DIAGNOSIS — E079 Disorder of thyroid, unspecified: Secondary | ICD-10-CM | POA: Insufficient documentation

## 2016-04-17 DIAGNOSIS — G2581 Restless legs syndrome: Secondary | ICD-10-CM | POA: Insufficient documentation

## 2016-04-17 DIAGNOSIS — Z79899 Other long term (current) drug therapy: Secondary | ICD-10-CM | POA: Diagnosis not present

## 2016-04-17 DIAGNOSIS — Z5181 Encounter for therapeutic drug level monitoring: Secondary | ICD-10-CM | POA: Insufficient documentation

## 2016-04-17 DIAGNOSIS — M791 Myalgia: Secondary | ICD-10-CM

## 2016-04-17 DIAGNOSIS — E039 Hypothyroidism, unspecified: Secondary | ICD-10-CM | POA: Insufficient documentation

## 2016-04-17 DIAGNOSIS — Z9981 Dependence on supplemental oxygen: Secondary | ICD-10-CM | POA: Diagnosis not present

## 2016-04-17 DIAGNOSIS — M4316 Spondylolisthesis, lumbar region: Secondary | ICD-10-CM

## 2016-04-17 DIAGNOSIS — Z9889 Other specified postprocedural states: Secondary | ICD-10-CM | POA: Insufficient documentation

## 2016-04-17 MED ORDER — FENTANYL 50 MCG/HR TD PT72: 50.0000 ug | MEDICATED_PATCH | TRANSDERMAL | 0 refills | Status: DC

## 2016-04-17 MED ORDER — HYDROCODONE-ACETAMINOPHEN 10-325 MG PO TABS
1.0000 | ORAL_TABLET | Freq: Four times a day (QID) | ORAL | 0 refills | Status: DC | PRN
Start: 2016-04-17 — End: 2016-05-15

## 2016-04-17 MED ORDER — CARISOPRODOL 350 MG PO TABS: 350.0000 mg | ORAL_TABLET | Freq: Three times a day (TID) | ORAL | 3 refills | Status: DC

## 2016-04-17 NOTE — Progress Notes (Signed)
Subjective:    Patient ID: Jill Phillips, female    DOB: 12/10/1955, 60 y.o.   MRN: [id number]  HPI:  Ms. DENNICE FRIERSON is a 60 year old female who returns for follow up for chronic pain and medication refill. She states her pain is located in her  left rib pain, lower back  and bilateral hips. She rates her pain 6. Her current exercise regime is walking and performing stretching exercises. Encouraged to  Think about smoking cessation, she verbalizes understanding.  Pain Inventory Average Pain 6 Pain Right Now 6 My pain is constant  In the last 24 hours, has pain interfered with the following? General activity 6 Relation with others 6 Enjoyment of life 6 What TIME of day is your pain at its worst? varies Sleep (in general) Fair  Pain is worse with: walking, inactivity, standing and some activites Pain improves with: rest, heat/ice and medication Relief from Meds: 8  Mobility walk without assistance ability to climb steps?  no do you drive?  no Do you have any goals in this area?  no  Function disabled: date disabled . Do you have any goals in this area?  no  Neuro/Psych weakness anxiety  Prior Studies Any changes since last visit?  no  Physicians involved in your care Any changes since last visit?  no   Family History  Problem Relation Age of Onset  . Kidney disease Mother   . Heart disease Father   . Anuerysm Brother 29    brain  . Heart disease Brother   . Heart disease Sister 48    s/p CABG  . Hypertension Sister    Social History   Social History  . Marital status: Divorced    Spouse name: n/a  . Number of children: 2  . Years of education: 12+   Occupational History  . disability     back surgeries   Social History Main Topics  . Smoking status: Current Every Day Smoker    Packs/day: 1.25    Years: 45.00    Types: Cigarettes  . Smokeless tobacco: Never Used  . Alcohol use No  . Drug use:     Types: Marijuana     Comment: 01/31/2016  "none since ~ 1980"  . Sexual activity: No   Other Topics Concern  . None   Social History Narrative   Lives alone.  One daughter is local, but is getting ready to move to [place], where her children live with their father.  The other daughter lives near [place], [place].   Past Surgical History:  Procedure Laterality Date  . APPENDECTOMY    . BACK SURGERY     18 back surgeries (2 thoracic & 16 lumbar) (01/31/2016)  . DILATION AND CURETTAGE OF UTERUS    . HAMMER TOE SURGERY    . JOINT REPLACEMENT    . KNEE ARTHROSCOPY Right   . LAPAROSCOPIC CHOLECYSTECTOMY    . LUMBAR FUSION N/A 08/01/2015   Procedure: Right sided L1-2 and L2-3 transforaminal lumbar interbody fusion with cages, Extension of posterior fusion T12 to L3, Replaced pedicle screws bilaterally L1-L2 , Replaced left sided pedicle screws L-3. Instrumentation T12 to L3 using local bone graft, Vivigen allograft and cancellous chips;  Surgeon: [name], MD;  Location: [facility];  Service: Orthopedics;  Laterality: N/A;  . LUMBAR LAMINECTOMY/DECOMPRESSION MICRODISCECTOMY N/A 01/28/2014   Procedure: Minimally Invasive Right  L1-2 Microdiscectomy;  Surgeon: [name], MD;  Location: [place];  Service: Orthopedics;  Laterality: N/A;  . TONSILLECTOMY    . TOTAL HIP ARTHROPLASTY Right   . TOTAL KNEE ARTHROPLASTY  05/29/2012   Procedure: TOTAL KNEE ARTHROPLASTY;  Surgeon: Mcarthur Rossetti, MD;  Location: [facility];  Service: Orthopedics;  Laterality: Right;  Right Total Knee Arthroplasty  . TUBAL LIGATION    . UVULOPALATOPHARYNGOPLASTY    . VAGINAL HYSTERECTOMY     Past Medical History:  Diagnosis Date  . Active smoker   . Anxiety   . Calcifying tendinitis of shoulder   . Chronic back pain   . Chronic pain syndrome   . COPD (chronic obstructive pulmonary disease) ([place])   . Dysthymic disorder   . Emphysema lung ([place])   . GERD (gastroesophageal reflux disease)   . Headache    "weekly maybe" (01/31/2016)  . Heart murmur    for  years, nothing to be concerned about  . Herpes genitalia   . History of blood transfusion 1980   related to "back surgery"  . Hyperlipidemia   . Hypertension   . Hypothyroidism   . Lumbago   . Osteoarthrosis, unspecified whether generalized or localized, lower leg   . Pain in joint, upper arm   . Pneumothorax, left 01/31/2016   S/P Left posterior subcostal pain injection on 01/30/2016  . PONV (postoperative nausea and vomiting)    gets nauseous  with longer surgery. Difficuty voiding after surgery  . Postlaminectomy syndrome, thoracic region   . Primary localized osteoarthrosis, lower leg   . Restless leg syndrome   . Sleep apnea    s/p surgery- last sleep study 2011- doesnt use oxygen or machine at night as instructed,.   12/2014- Dr Halford Chessman  reports it is negative.  . Thyroid disease    BP 108/71   Pulse 98   Resp 14   SpO2 91%   Opioid Risk Score:   Fall Risk Score:  `1  Depression screen PHQ 2/9  Depression screen [facility] 2/9 12/07/2015 11/15/2015 10/13/2015 09/06/2015 05/22/2015 02/15/2015 11/24/2014  Decreased Interest 0 0 0 0 0 0 0  Down, Depressed, Hopeless 0 0 - 0 0 0 0  PHQ - 2 Score 0 0 0 0 0 0 0  Altered sleeping - - - - - - 3  Tired, decreased energy - - - - - - 3  Change in appetite - - - - - - 1  Feeling bad or failure about yourself  - - - - - - 0  Trouble concentrating - - - - - - 0  Moving slowly or fidgety/restless - - - - - - 0  Suicidal thoughts - - - - - - 0  PHQ-9 Score - - - - - - 7  Some recent data might be hidden    Review of Systems  HENT: Negative.   Eyes: Negative.   Respiratory: Positive for shortness of breath.   Gastrointestinal: Negative.   Endocrine: Negative.   Genitourinary: Negative.   Musculoskeletal: Positive for arthralgias, back pain and myalgias.  Skin: Negative.   Allergic/Immunologic: Negative.   Neurological: Positive for weakness.  Hematological: Negative.   Psychiatric/Behavioral: The patient is nervous/anxious.   All other  systems reviewed and are negative.      Objective:   Physical Exam  Constitutional: She is oriented to person, place, and time. She appears well-developed and well-nourished.  HENT:  Head: Normocephalic and atraumatic.  Neck: Normal range of motion. Neck supple.  Cardiovascular: Normal rate and regular rhythm.  Pulmonary/Chest: Effort normal and breath sounds normal.  Musculoskeletal:  Normal Muscle Bulk and Muscle Testing Reveals: Upper Extremities: Full ROM and Muscle Strength 5/5 Lumbar Paraspinal Tenderness: L-3-L-5 Mainly Right Side Lower Extremities: Full ROM and Muscle Strength 5/5 Arises from table with ease Narrow Based Gait  Neurological: She is alert and oriented to person, place, and time.  Skin: Skin is warm and dry.  Psychiatric: She has a normal mood and affect.  Nursing note and vitals reviewed.         Assessment & Plan:  1. Chronic lumbar spine pain/post-lami syndrome: Refilled: Fentanyl 50 MCG one patch every three days #10 and Hydrocodone 10/325 mg one tablet every 6 hours as needed for pain #120. We will continue the opioid monitoring program, this consists of regular clinic visits, examinations, urine drug screen, pill counts as well as use of [place] Controlled Substance Reporting System. 2. Osteoarthritis of the knees bilaterally:No complaints today. Continue with Heat, exercise and voltaren gel.  3. Rotator cuff syndrome/subacromial bursitis: No complaints voiced today. 4. Restless legs syndrome: Continue Requip.Continue to monitor 5. Tobacco Abuse: Encourage Smoking Cessation: Pulmonologist Following 6. Emphysema: Pulmonology Following 7. Muscle Spasm: Continue [place]  20 minutes of face to face patient care time was spent during this visit. All questions were encouraged and answered.   F/u in 1 month

## 2016-04-18 ENCOUNTER — Other Ambulatory Visit: Payer: Self-pay | Admitting: Internal Medicine

## 2016-04-25 DIAGNOSIS — M7062 Trochanteric bursitis, left hip: Secondary | ICD-10-CM | POA: Diagnosis not present

## 2016-04-25 DIAGNOSIS — M546 Pain in thoracic spine: Secondary | ICD-10-CM | POA: Diagnosis not present

## 2016-04-25 DIAGNOSIS — M7061 Trochanteric bursitis, right hip: Secondary | ICD-10-CM | POA: Diagnosis not present

## 2016-04-26 ENCOUNTER — Other Ambulatory Visit: Payer: Self-pay | Admitting: *Deleted

## 2016-04-26 DIAGNOSIS — M5136 Other intervertebral disc degeneration, lumbar region: Secondary | ICD-10-CM

## 2016-04-26 DIAGNOSIS — G894 Chronic pain syndrome: Secondary | ICD-10-CM

## 2016-04-26 DIAGNOSIS — M7918 Myalgia, other site: Secondary | ICD-10-CM

## 2016-04-26 DIAGNOSIS — M4316 Spondylolisthesis, lumbar region: Secondary | ICD-10-CM

## 2016-04-26 DIAGNOSIS — Z5181 Encounter for therapeutic drug level monitoring: Secondary | ICD-10-CM

## 2016-04-26 DIAGNOSIS — Z79899 Other long term (current) drug therapy: Secondary | ICD-10-CM

## 2016-04-26 LAB — TOXASSURE SELECT,+ANTIDEPR,UR: PDF: 0

## 2016-04-26 MED ORDER — ROPINIROLE HCL 0.5 MG PO TABS: ORAL_TABLET | ORAL | 1 refills | Status: DC

## 2016-04-30 ENCOUNTER — Other Ambulatory Visit: Payer: Self-pay | Admitting: Specialist

## 2016-04-30 DIAGNOSIS — M25551 Pain in right hip: Secondary | ICD-10-CM

## 2016-04-30 DIAGNOSIS — M546 Pain in thoracic spine: Secondary | ICD-10-CM

## 2016-04-30 NOTE — Progress Notes (Signed)
Urine drug screen for this encounter is consistent for prescribed medications.   

## 2016-05-02 ENCOUNTER — Telehealth: Payer: Self-pay | Admitting: Physical Medicine & Rehabilitation

## 2016-05-02 ENCOUNTER — Ambulatory Visit (INDEPENDENT_AMBULATORY_CARE_PROVIDER_SITE_OTHER): Payer: Medicare Other | Admitting: Internal Medicine

## 2016-05-02 DIAGNOSIS — Z8041 Family history of malignant neoplasm of ovary: Secondary | ICD-10-CM

## 2016-05-02 DIAGNOSIS — R2231 Localized swelling, mass and lump, right upper limb: Secondary | ICD-10-CM | POA: Diagnosis not present

## 2016-05-02 DIAGNOSIS — R2230 Localized swelling, mass and lump, unspecified upper limb: Secondary | ICD-10-CM | POA: Insufficient documentation

## 2016-05-02 NOTE — Patient Instructions (Addendum)
Thank you for your visit today  Please do the mammogram  Please follow up with your PCP

## 2016-05-02 NOTE — Progress Notes (Signed)
   CC: concern for knot in right axillae near the breast HPI: [name] is a 60 y.o. woman with PMH noted below here for a knot she felt yesterday in right axilla  Please see Problem List/A&P for the status of the patient's chronic medical problems   Past Medical History:  Diagnosis Date  . Active smoker   . Anxiety   . Calcifying tendinitis of shoulder   . Chronic back pain   . Chronic pain syndrome   . COPD (chronic obstructive pulmonary disease) ([place])   . Dysthymic disorder   . Emphysema lung ([place])   . GERD (gastroesophageal reflux disease)   . Headache    "weekly maybe" (01/31/2016)  . Heart murmur    for years, nothing to be concerned about  . Herpes genitalia   . History of blood transfusion 1980   related to "back surgery"  . Hyperlipidemia   . Hypertension   . Hypothyroidism   . Lumbago   . Osteoarthrosis, unspecified whether generalized or localized, lower leg   . Pain in joint, upper arm   . Pneumothorax, left 01/31/2016   S/P Left posterior subcostal pain injection on 01/30/2016  . PONV (postoperative nausea and vomiting)    gets nauseous  with longer surgery. Difficuty voiding after surgery  . Postlaminectomy syndrome, thoracic region   . Primary localized osteoarthrosis, lower leg   . Restless leg syndrome   . Sleep apnea    s/p surgery- last sleep study 2011- doesnt use oxygen or machine at night as instructed,.   12/2014- Dr Halford Chessman  reports it is negative.  . Thyroid disease     Review of Systems: Denies fevers, weight loss or weight gain, or fatigue Denies n/v/d Denies myalgias  Physical Exam: Vitals:   05/02/16 1509  BP: (!) 142/74  Pulse: 88  Temp: 98.3 F (36.8 C)  TempSrc: Oral  SpO2: 100%  Weight: 155 lb 11.2 oz (70.6 kg)  Height: 5\' 4"  (1.626 m)    General: A&O, in NAD Skin: No knots or lymph nodes able to be palpated in right axillae or left axillae.  Exam performed with the chaperone Ms Fortescue   Patient was in extreme hurry  and did not allow me to finish auscultating heart and lungs.    Assessment & Plan:   See encounters tab for problem based medical decision making. Patient discussed with Dr. Eppie Gibson

## 2016-05-02 NOTE — Telephone Encounter (Signed)
Patient needs a refill on Klonopin.  This needs to be called into her drug store before 12 in order for them to deliver.  Please call patient when this is done.

## 2016-05-02 NOTE — Telephone Encounter (Signed)
Left message to make pt aware.

## 2016-05-02 NOTE — Assessment & Plan Note (Addendum)
Patient is here for concern regarding a knot in her right axilla which she noticed yesterday. She feels well overall and no weight loss or night sweats. On exam, no mass palpated.  However, pt has a family history of ovarian cancer- that was diagnosed in her daughter at the age of 42. Pt does not know if there is history of breast cancer. Pt says her last screening mammogram was over 10 years ago  Plan -ordered screening mammogram -asked to rtc if she continues to notice the knot is getting bigger or red flags like night sweats or fevers

## 2016-05-02 NOTE — Telephone Encounter (Signed)
Klonopin has been called into pharmacy.

## 2016-05-03 NOTE — Progress Notes (Signed)
Case discussed with Dr. Tiburcio Pea soon after the resident saw the patient.  We reviewed the resident's history and exam and pertinent patient test results.  I agree with the assessment, diagnosis and plan of care documented in the resident's note.

## 2016-05-08 ENCOUNTER — Ambulatory Visit
Admission: RE | Admit: 2016-05-08 | Discharge: 2016-05-08 | Disposition: A | Discharge status: Home / Self Care | Payer: Medicare Other | Source: Ambulatory Visit | Attending: Specialist | Admitting: Specialist

## 2016-05-08 DIAGNOSIS — M4324 Fusion of spine, thoracic region: Secondary | ICD-10-CM | POA: Diagnosis not present

## 2016-05-08 DIAGNOSIS — M546 Pain in thoracic spine: Secondary | ICD-10-CM

## 2016-05-15 ENCOUNTER — Encounter: Payer: Medicare Other | Attending: Physical Medicine & Rehabilitation | Admitting: Registered Nurse

## 2016-05-15 ENCOUNTER — Other Ambulatory Visit: Payer: Self-pay | Admitting: Pulmonary Disease

## 2016-05-15 ENCOUNTER — Encounter: Payer: Self-pay | Admitting: Registered Nurse

## 2016-05-15 VITALS — BP 144/78 | HR 81 | Resp 16

## 2016-05-15 DIAGNOSIS — Z9981 Dependence on supplemental oxygen: Secondary | ICD-10-CM | POA: Insufficient documentation

## 2016-05-15 DIAGNOSIS — M170 Bilateral primary osteoarthritis of knee: Secondary | ICD-10-CM | POA: Insufficient documentation

## 2016-05-15 DIAGNOSIS — M2170 Unequal limb length (acquired), unspecified site: Secondary | ICD-10-CM | POA: Insufficient documentation

## 2016-05-15 DIAGNOSIS — M5126 Other intervertebral disc displacement, lumbar region: Secondary | ICD-10-CM | POA: Diagnosis not present

## 2016-05-15 DIAGNOSIS — M62830 Muscle spasm of back: Secondary | ICD-10-CM | POA: Diagnosis not present

## 2016-05-15 DIAGNOSIS — G2581 Restless legs syndrome: Secondary | ICD-10-CM | POA: Insufficient documentation

## 2016-05-15 DIAGNOSIS — Z9889 Other specified postprocedural states: Secondary | ICD-10-CM | POA: Insufficient documentation

## 2016-05-15 DIAGNOSIS — G9619 Other disorders of meninges, not elsewhere classified: Secondary | ICD-10-CM | POA: Insufficient documentation

## 2016-05-15 DIAGNOSIS — K219 Gastro-esophageal reflux disease without esophagitis: Secondary | ICD-10-CM | POA: Insufficient documentation

## 2016-05-15 DIAGNOSIS — R0781 Pleurodynia: Secondary | ICD-10-CM | POA: Diagnosis not present

## 2016-05-15 DIAGNOSIS — Z5181 Encounter for therapeutic drug level monitoring: Secondary | ICD-10-CM | POA: Diagnosis not present

## 2016-05-15 DIAGNOSIS — E039 Hypothyroidism, unspecified: Secondary | ICD-10-CM | POA: Diagnosis not present

## 2016-05-15 DIAGNOSIS — I10 Essential (primary) hypertension: Secondary | ICD-10-CM | POA: Diagnosis not present

## 2016-05-15 DIAGNOSIS — M7918 Myalgia, other site: Secondary | ICD-10-CM

## 2016-05-15 DIAGNOSIS — M961 Postlaminectomy syndrome, not elsewhere classified: Secondary | ICD-10-CM | POA: Diagnosis not present

## 2016-05-15 DIAGNOSIS — Z79899 Other long term (current) drug therapy: Secondary | ICD-10-CM | POA: Insufficient documentation

## 2016-05-15 DIAGNOSIS — E785 Hyperlipidemia, unspecified: Secondary | ICD-10-CM | POA: Diagnosis not present

## 2016-05-15 DIAGNOSIS — G894 Chronic pain syndrome: Secondary | ICD-10-CM

## 2016-05-15 DIAGNOSIS — Z720 Tobacco use: Secondary | ICD-10-CM | POA: Diagnosis not present

## 2016-05-15 DIAGNOSIS — E079 Disorder of thyroid, unspecified: Secondary | ICD-10-CM | POA: Diagnosis not present

## 2016-05-15 DIAGNOSIS — M5136 Other intervertebral disc degeneration, lumbar region: Secondary | ICD-10-CM

## 2016-05-15 DIAGNOSIS — M4316 Spondylolisthesis, lumbar region: Secondary | ICD-10-CM

## 2016-05-15 DIAGNOSIS — J449 Chronic obstructive pulmonary disease, unspecified: Secondary | ICD-10-CM | POA: Diagnosis not present

## 2016-05-15 DIAGNOSIS — M791 Myalgia: Secondary | ICD-10-CM

## 2016-05-15 MED ORDER — QUETIAPINE FUMARATE 100 MG PO TABS: 100.0000 mg | ORAL_TABLET | Freq: Every day | ORAL | 2 refills | Status: DC

## 2016-05-15 MED ORDER — HYDROCODONE-ACETAMINOPHEN 10-325 MG PO TABS
1.0000 | ORAL_TABLET | Freq: Four times a day (QID) | ORAL | 0 refills | Status: DC | PRN
Start: 2016-05-15 — End: 2016-06-12

## 2016-05-15 MED ORDER — CLONAZEPAM 2 MG PO TABS: ORAL_TABLET | ORAL | 2 refills | Status: DC

## 2016-05-15 MED ORDER — ROPINIROLE HCL 0.5 MG PO TABS: ORAL_TABLET | ORAL | 2 refills | Status: DC

## 2016-05-15 MED ORDER — FENTANYL 50 MCG/HR TD PT72: 50.0000 ug | MEDICATED_PATCH | TRANSDERMAL | 0 refills | Status: DC

## 2016-05-15 NOTE — Progress Notes (Signed)
Subjective:    Patient ID: Jill Phillips, female    DOB: 1956/03/09, 60 y.o.   MRN: [id number]  HPI: [name] is a 60 year old female who returns for follow up for chronic pain and medication refill. She states her pain is located in her  left rib pain and mid-back. She rates her pain 5. Her current exercise regime is walking and performing stretching exercises. Encouraged and educated regarding  smoking cessation, she verbalizes understanding.  Pain Inventory Average Pain 6 Pain Right Now 5 My pain is spasm in ribs left  In the last 24 hours, has pain interfered with the following? General activity 3 Relation with others 3 Enjoyment of life 1 What TIME of day is your pain at its worst? daytime and night Sleep (in general) Poor  Pain is worse with: some activites Pain improves with: medication Relief from Meds: not answered  Mobility walk without assistance ability to climb steps?  yes do you drive?  no  Function Do you have any goals in this area?  no  Neuro/Psych No problems in this area  Prior Studies Any changes since last visit?  yes  Had nerve block in left rib area and had  Pneumothorax afterwards  Physicians involved in your care Any changes since last visit?  yes Orthopedist Laurence Spates   Family History  Problem Relation Age of Onset  . Kidney disease Mother   . Heart disease Father   . Anuerysm Brother 29    brain  . Heart disease Brother   . Heart disease Sister 82    s/p CABG  . Hypertension Sister    Social History   Social History  . Marital status: Divorced    Spouse name: n/a  . Number of children: 2  . Years of education: 12+   Occupational History  . disability     back surgeries   Social History Main Topics  . Smoking status: Current Every Day Smoker    Packs/day: 1.25    Years: 45.00    Types: Cigarettes  . Smokeless tobacco: Never Used  . Alcohol use No  . Drug use:     Types: Marijuana     Comment: 01/31/2016  "none since ~ 1980"  . Sexual activity: No   Other Topics Concern  . None   Social History Narrative   Lives alone.  One daughter is local, but is getting ready to move to [place], where her children live with their father.  The other daughter lives near [place], [place].   Past Surgical History:  Procedure Laterality Date  . APPENDECTOMY    . BACK SURGERY     18 back surgeries (2 thoracic & 16 lumbar) (01/31/2016)  . DILATION AND CURETTAGE OF UTERUS    . HAMMER TOE SURGERY    . JOINT REPLACEMENT    . KNEE ARTHROSCOPY Right   . LAPAROSCOPIC CHOLECYSTECTOMY    . LUMBAR FUSION N/A 08/01/2015   Procedure: Right sided L1-2 and L2-3 transforaminal lumbar interbody fusion with cages, Extension of posterior fusion T12 to L3, Replaced pedicle screws bilaterally L1-L2 , Replaced left sided pedicle screws L-3. Instrumentation T12 to L3 using local bone graft, Vivigen allograft and cancellous chips;  Surgeon: [name], MD;  Location: [facility];  Service: Orthopedics;  Laterality: N/A;  . LUMBAR LAMINECTOMY/DECOMPRESSION MICRODISCECTOMY N/A 01/28/2014   Procedure: Minimally Invasive Right  L1-2 Microdiscectomy;  Surgeon: [name], MD;  Location: [facility];  Service:  Orthopedics;  Laterality: N/A;  . TONSILLECTOMY    . TOTAL HIP ARTHROPLASTY Right   . TOTAL KNEE ARTHROPLASTY  05/29/2012   Procedure: TOTAL KNEE ARTHROPLASTY;  Surgeon: Mcarthur Rossetti, MD;  Location: [facility];  Service: Orthopedics;  Laterality: Right;  Right Total Knee Arthroplasty  . TUBAL LIGATION    . UVULOPALATOPHARYNGOPLASTY    . VAGINAL HYSTERECTOMY     Past Medical History:  Diagnosis Date  . Active smoker   . Anxiety   . Calcifying tendinitis of shoulder   . Chronic back pain   . Chronic pain syndrome   . COPD (chronic obstructive pulmonary disease) ([place])   . Dysthymic disorder   . Emphysema lung ([place])   . GERD (gastroesophageal reflux disease)   . Headache    "weekly maybe" (01/31/2016)  . Heart murmur    for  years, nothing to be concerned about  . Herpes genitalia   . History of blood transfusion 1980   related to "back surgery"  . Hyperlipidemia   . Hypertension   . Hypothyroidism   . Lumbago   . Osteoarthrosis, unspecified whether generalized or localized, lower leg   . Pain in joint, upper arm   . Pneumothorax, left 01/31/2016   S/P Left posterior subcostal pain injection on 01/30/2016  . PONV (postoperative nausea and vomiting)    gets nauseous  with longer surgery. Difficuty voiding after surgery  . Postlaminectomy syndrome, thoracic region   . Primary localized osteoarthrosis, lower leg   . Restless leg syndrome   . Sleep apnea    s/p surgery- last sleep study 2011- doesnt use oxygen or machine at night as instructed,.   12/2014- Dr Halford Chessman  reports it is negative.  . Thyroid disease    BP (!) 144/78 (BP Location: Left Arm, Patient Position: Sitting, Cuff Size: Normal)   Pulse 81   Resp 16   SpO2 96%   Opioid Risk Score:   Fall Risk Score:  `1  Depression screen PHQ 2/9  Depression screen [facility] 2/9 05/15/2016 05/02/2016 12/07/2015 11/15/2015 10/13/2015 09/06/2015 05/22/2015  Decreased Interest 0 0 0 0 0 0 0  Down, Depressed, Hopeless 0 0 0 0 - 0 0  PHQ - 2 Score 0 0 0 0 0 0 0  Altered sleeping - - - - - - -  Tired, decreased energy - - - - - - -  Change in appetite - - - - - - -  Feeling bad or failure about yourself  - - - - - - -  Trouble concentrating - - - - - - -  Moving slowly or fidgety/restless - - - - - - -  Suicidal thoughts - - - - - - -  PHQ-9 Score - - - - - - -  Some recent data might be hidden   Review of Systems  Gastrointestinal: Positive for nausea.  All other systems reviewed and are negative.      Objective:   Physical Exam  Constitutional: She is oriented to person, place, and time. She appears well-developed and well-nourished.  HENT:  Head: Normocephalic and atraumatic.  Neck: Normal range of motion. Neck supple.  Cardiovascular: Normal rate and  regular rhythm.   Pulmonary/Chest: Effort normal and breath sounds normal.  Musculoskeletal:  Normal Muscle Bulk and Muscle Testing Reveals:  Upper Extremities: Right: Full ROM and Muscle Strength 5/5 Left: Decreased ROM 90 Degrees and Muscle Strength 5/5 Left AC Joint Tenderness Thoracic Paraspinal Tenderness: mainly Left Side T-7-  T-9 Lower Extremities: Full ROM and Muscle Strength 5/5 Arises from Table with ease Narrow Based Gait  Neurological: She is alert and oriented to person, place, and time.  Skin: Skin is warm and dry.  Psychiatric: She has a normal mood and affect.  Nursing note and vitals reviewed.         Assessment & Plan:  1. Chronic lumbar spine pain/post-lami syndrome: Refilled: Fentanyl 50 MCG one patch every three days #10 and Hydrocodone 10/325 mg one tablet every 6 hours as needed for pain #120. We will continue the opioid monitoring program, this consists of regular clinic visits, examinations, urine drug screen, pill counts as well as use of [place] Controlled Substance Reporting System. 2. Osteoarthritis of the knees bilaterally:No complaints today. Continue with Heat, exercise and voltaren gel.  3. Rotator cuff syndrome/subacromial bursitis: No complaints voiced today. 4. Restless legs syndrome: Continue Requip.Continue to monitor 5. Tobacco Abuse: Encourage Smoking Cessation: Pulmonologist Following 6. Emphysema: Pulmonology Following 7. Muscle Spasm: Continue Soma 8. Insomnia: Continue Seroquel  20 minutes of face to face patient care time was spent during this visit. All questions were encouraged and answered.   F/u in 1 month

## 2016-05-16 ENCOUNTER — Inpatient Hospital Stay: Admission: RE | Admit: 2016-05-16 | Payer: Medicare Other | Source: Ambulatory Visit

## 2016-05-17 DIAGNOSIS — G4734 Idiopathic sleep related nonobstructive alveolar hypoventilation: Secondary | ICD-10-CM | POA: Diagnosis not present

## 2016-05-20 ENCOUNTER — Other Ambulatory Visit: Payer: Self-pay | Admitting: Internal Medicine

## 2016-05-20 DIAGNOSIS — R2231 Localized swelling, mass and lump, right upper limb: Secondary | ICD-10-CM

## 2016-05-28 ENCOUNTER — Telehealth: Payer: Self-pay | Admitting: *Deleted

## 2016-05-28 NOTE — Telephone Encounter (Signed)
Pt calls and states she has a uti, bladder infection, something going on for 3 weeks, she got otc AZO and it seems to help but is taking 2 everytime she goes to urinate also she has started taking clindamycin that she has for dental procedures but her symptoms dont go away entirely- low back pain, urgency, burning. Pt is advised that she needs to be seen for eval, states she does not have transportation just wants something called in, advised that she will need to be seen, she states thanks but she cant do that

## 2016-05-31 NOTE — Telephone Encounter (Signed)
I agree. If patient calls back please let me know.

## 2016-06-03 ENCOUNTER — Ambulatory Visit (INDEPENDENT_AMBULATORY_CARE_PROVIDER_SITE_OTHER): Payer: Medicare Other | Admitting: Specialist

## 2016-06-03 ENCOUNTER — Other Ambulatory Visit (INDEPENDENT_AMBULATORY_CARE_PROVIDER_SITE_OTHER): Payer: Self-pay | Admitting: Specialist

## 2016-06-03 DIAGNOSIS — M546 Pain in thoracic spine: Secondary | ICD-10-CM

## 2016-06-03 DIAGNOSIS — M545 Low back pain: Secondary | ICD-10-CM

## 2016-06-05 ENCOUNTER — Other Ambulatory Visit: Payer: Self-pay | Admitting: Internal Medicine

## 2016-06-07 ENCOUNTER — Encounter (HOSPITAL_COMMUNITY): Payer: Self-pay | Admitting: Emergency Medicine

## 2016-06-07 ENCOUNTER — Emergency Department (HOSPITAL_COMMUNITY)
Admission: EM | Admit: 2016-06-07 | Discharge: 2016-06-07 | Disposition: A | Discharge status: Home / Self Care | Payer: Medicare Other | Attending: Emergency Medicine | Admitting: Emergency Medicine

## 2016-06-07 DIAGNOSIS — I959 Hypotension, unspecified: Secondary | ICD-10-CM

## 2016-06-07 DIAGNOSIS — J449 Chronic obstructive pulmonary disease, unspecified: Secondary | ICD-10-CM | POA: Diagnosis not present

## 2016-06-07 DIAGNOSIS — Z79899 Other long term (current) drug therapy: Secondary | ICD-10-CM | POA: Diagnosis not present

## 2016-06-07 DIAGNOSIS — E039 Hypothyroidism, unspecified: Secondary | ICD-10-CM | POA: Insufficient documentation

## 2016-06-07 DIAGNOSIS — Z8679 Personal history of other diseases of the circulatory system: Secondary | ICD-10-CM | POA: Diagnosis not present

## 2016-06-07 DIAGNOSIS — Z791 Long term (current) use of non-steroidal anti-inflammatories (NSAID): Secondary | ICD-10-CM | POA: Diagnosis not present

## 2016-06-07 DIAGNOSIS — R42 Dizziness and giddiness: Secondary | ICD-10-CM | POA: Diagnosis not present

## 2016-06-07 DIAGNOSIS — F17210 Nicotine dependence, cigarettes, uncomplicated: Secondary | ICD-10-CM | POA: Insufficient documentation

## 2016-06-07 DIAGNOSIS — R404 Transient alteration of awareness: Secondary | ICD-10-CM | POA: Diagnosis not present

## 2016-06-07 LAB — COMPREHENSIVE METABOLIC PANEL
ALT: 30 U/L (ref 14–54)
AST: 44 U/L — ABNORMAL HIGH (ref 15–41)
Albumin: 3.9 g/dL (ref 3.5–5.0)
Alkaline Phosphatase: 96 U/L (ref 38–126)
Anion gap: 5 (ref 5–15)
BUN: 18 mg/dL (ref 6–20)
CO2: 29 mmol/L (ref 22–32)
Calcium: 8.2 mg/dL — ABNORMAL LOW (ref 8.9–10.3)
Chloride: 109 mmol/L (ref 101–111)
Creatinine, Ser: 1.19 mg/dL — ABNORMAL HIGH (ref 0.44–1.00)
GFR calc Af Amer: 56 mL/min — ABNORMAL LOW (ref >60)
GFR calc non Af Amer: 48 mL/min — ABNORMAL LOW (ref >60)
Glucose, Bld: 70 mg/dL (ref 65–99)
Potassium: 3.6 mmol/L (ref 3.5–5.1)
Sodium: 143 mmol/L (ref 135–145)
Total Bilirubin: 0.5 mg/dL (ref 0.3–1.2)
Total Protein: 7 g/dL (ref 6.5–8.1)

## 2016-06-07 LAB — CBC WITH DIFFERENTIAL/PLATELET
Basophils Absolute: 0 10*3/uL (ref 0.0–0.1)
Basophils Relative: 0 %
Eosinophils Absolute: 0.1 10*3/uL (ref 0.0–0.7)
Eosinophils Relative: 2 %
HCT: 38.1 % (ref 36.0–46.0)
Hemoglobin: 12.1 g/dL (ref 12.0–15.0)
Lymphocytes Relative: 43 %
Lymphs Abs: 3.3 10*3/uL (ref 0.7–4.0)
MCH: 31.2 pg (ref 26.0–34.0)
MCHC: 31.8 g/dL (ref 30.0–36.0)
MCV: 98.2 fL (ref 78.0–100.0)
Monocytes Absolute: 0.3 10*3/uL (ref 0.1–1.0)
Monocytes Relative: 4 %
Neutro Abs: 3.9 10*3/uL (ref 1.7–7.7)
Neutrophils Relative %: 51 %
Platelets: 227 10*3/uL (ref 150–400)
RBC: 3.88 MIL/uL (ref 3.87–5.11)
RDW: 14 % (ref 11.5–15.5)
WBC: 7.6 10*3/uL (ref 4.0–10.5)

## 2016-06-07 LAB — URINALYSIS, ROUTINE W REFLEX MICROSCOPIC
Bilirubin Urine: NEGATIVE
Glucose, UA: NEGATIVE mg/dL
Hgb urine dipstick: NEGATIVE
Ketones, ur: NEGATIVE mg/dL
Leukocytes, UA: NEGATIVE
Nitrite: NEGATIVE
Protein, ur: NEGATIVE mg/dL
Specific Gravity, Urine: 1.018 (ref 1.005–1.030)
pH: 6.5 (ref 5.0–8.0)

## 2016-06-07 MED ORDER — HYDROCODONE-ACETAMINOPHEN 5-325 MG PO TABS
2.0000 | ORAL_TABLET | Freq: Once | ORAL | Status: AC
  Administered 2016-06-07: 2 via ORAL
  Filled 2016-06-07: qty 2

## 2016-06-07 MED ORDER — SODIUM CHLORIDE 0.9 % IV BOLUS (SEPSIS)
1000.0000 mL | Freq: Once | INTRAVENOUS | Status: AC
  Administered 2016-06-07: 1000 mL via INTRAVENOUS

## 2016-06-07 NOTE — ED Notes (Signed)
Bed: OE78 Expected date:  Expected time:  Means of arrival:  Comments: Hold EMS

## 2016-06-07 NOTE — ED Provider Notes (Signed)
[facility] DEPT Provider Note   CSN: [id number] Arrival date & time: 06/07/16  1624     History   Chief Complaint Chief Complaint  Patient presents with  . Dizziness  . Hypotension    HPI Jill Phillips is a 60 y.o. female.  60 year old female with extensive past medical history including chronic pain, hypertension, hyperlipidemia, GERD who presents with hypotension and dizziness. Patient states that this morning after she ate breakfast and took her usual medications including her blood pressure medicine, she began feeling dizzy which she describes as a lightheadedness. She has felt this before when her blood pressure is low. She called EMS and they found her to be hypotensive with BP 80/50, heart rate 76. She was given 600 ml NS in route. She feels better now. She states that she has been eating and drinking normally. She denies any fevers, vomiting, diarrhea, abdominal pain, chest pain, or shortness of breath. No recent illness. She is currently on ciprofloxacin for UTI symptoms and states that her bladder pain has improved since starting the antibiotics. She denies any recent changes to her medications and states that she is certain she did not accidentally take too much of any medication.   The history is provided by the patient.    Past Medical History:  Diagnosis Date  . Active smoker   . Anxiety   . Calcifying tendinitis of shoulder   . Chronic back pain   . Chronic pain syndrome   . COPD (chronic obstructive pulmonary disease) ([facility])   . Dysthymic disorder   . Emphysema lung ([place])   . GERD (gastroesophageal reflux disease)   . Headache    "weekly maybe" (01/31/2016)  . Heart murmur    for years, nothing to be concerned about  . Herpes genitalia   . History of blood transfusion 1980   related to "back surgery"  . Hyperlipidemia   . Hypertension   . Hypothyroidism   . Lumbago   . Osteoarthrosis, unspecified whether generalized or localized, lower leg   . Pain  in joint, upper arm   . Pneumothorax, left 01/31/2016   S/P Left posterior subcostal pain injection on 01/30/2016  . PONV (postoperative nausea and vomiting)    gets nauseous  with longer surgery. Difficuty voiding after surgery  . Postlaminectomy syndrome, thoracic region   . Primary localized osteoarthrosis, lower leg   . Restless leg syndrome   . Sleep apnea    s/p surgery- last sleep study 2011- doesnt use oxygen or machine at night as instructed,.   12/2014- Dr Halford Chessman  reports it is negative.  . Thyroid disease     Patient Active Problem List   Diagnosis Date Noted  . Lump of axilla 05/02/2016  . Pneumothorax on left 01/31/2016  . Chronic, continuous use of opioids 09/06/2015  . Degenerative disc disease, lumbar 08/01/2015    Class: Chronic  . Spondylolisthesis of lumbar region 08/01/2015    Class: Chronic  . Skin ulcer ([place]) 04/05/2015  . Urinary, incontinence, stress female 05/06/2014  . Constipation 05/05/2014  . 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 HTN (hypertension) 05/19/2013  . HLD (hyperlipidemia) 05/19/2013  . Depression 05/19/2013  . Hypothyroidism 05/19/2013  . Healthcare maintenance 05/19/2013  . Tobacco abuse   . Osteoarthritis of both knees 11/18/2011    Past Surgical History:  Procedure Laterality Date  . APPENDECTOMY    . BACK SURGERY     18 back surgeries (2 thoracic & 16 lumbar) (01/31/2016)  . DILATION AND CURETTAGE  OF UTERUS    . HAMMER TOE SURGERY    . JOINT REPLACEMENT    . KNEE ARTHROSCOPY Right   . LAPAROSCOPIC CHOLECYSTECTOMY    . LUMBAR FUSION N/A 08/01/2015   Procedure: Right sided L1-2 and L2-3 transforaminal lumbar interbody fusion with cages, Extension of posterior fusion T12 to L3, Replaced pedicle screws bilaterally L1-L2 , Replaced left sided pedicle screws L-3. Instrumentation T12 to L3 using local bone graft, Vivigen allograft and cancellous chips;  Surgeon: [name], MD;  Location: [facility];  Service: Orthopedics;  Laterality: N/A;  . LUMBAR  LAMINECTOMY/DECOMPRESSION MICRODISCECTOMY N/A 01/28/2014   Procedure: Minimally Invasive Right  L1-2 Microdiscectomy;  Surgeon: [name], MD;  Location: [facility];  Service: Orthopedics;  Laterality: N/A;  . TONSILLECTOMY    . TOTAL HIP ARTHROPLASTY Right   . TOTAL KNEE ARTHROPLASTY  05/29/2012   Procedure: TOTAL KNEE ARTHROPLASTY;  Surgeon: Mcarthur Rossetti, MD;  Location: [facility];  Service: Orthopedics;  Laterality: Right;  Right Total Knee Arthroplasty  . TUBAL LIGATION    . UVULOPALATOPHARYNGOPLASTY    . VAGINAL HYSTERECTOMY      OB History    No data available       Home Medications    Prior to Admission medications   Medication Sig Start Date End Date Taking? Authorizing Provider  acetaminophen (TYLENOL) 500 MG tablet Take 1,000 mg by mouth every 6 (six) hours as needed for headache.    Yes Historical Provider, MD  amLODipine (NORVASC) 10 MG tablet Take 1 tablet (10 mg total) by mouth daily. 10/24/15 10/23/16 Yes Marjan Rabbani, MD  benazepril (LOTENSIN) 40 MG tablet TAKE 1 TABLET BY MOUTH DAILY. 10/27/15  Yes Alexa Angela Burke, MD  ciprofloxacin (CIPRO) 500 MG tablet Take 500 mg by mouth 2 (two) times daily.  06/03/16  Yes Historical Provider, MD  clonazePAM (KLONOPIN) 2 MG tablet TAKE 1 TABLET BY MOUTH 2 TIMES A DAY AS NEEDED. 05/15/16  Yes Bayard Hugger, NP  fentaNYL (DURAGESIC - DOSED MCG/HR) 50 MCG/HR Place 1 patch (50 mcg total) onto the skin every 3 (three) days. 05/15/16  Yes Bayard Hugger, NP  gabapentin (NEURONTIN) 300 MG capsule TAKE 1 CAPSULE BY MOUTH 2 TIMES DAILY. 02/13/16  Yes Meredith Staggers, MD  hydrochlorothiazide (HYDRODIURIL) 12.5 MG tablet TAKE 1 TABLET BY MOUTH DAILY 06/05/16  Yes Alexa Angela Burke, MD  HYDROcodone-acetaminophen (NORCO) 10-325 MG tablet Take 1 tablet by mouth every 6 (six) hours as needed (breakthrough pain). 05/15/16  Yes Bayard Hugger, NP  levothyroxine (SYNTHROID, LEVOTHROID) 75 MCG tablet Take 1 tablet (75 mcg total) by mouth daily before  breakfast. 09/07/15  Yes Alexa Angela Burke, MD  naproxen (NAPROSYN) 500 MG tablet Take 500 mg by mouth 2 (two) times daily as needed for moderate pain.  01/19/16  Yes Historical Provider, MD  omeprazole (PRILOSEC) 20 MG capsule TAKE 1 CAPSULE BY MOUTH 2 TIMES DAILY BEFORE A MEAL. 08/17/15  Yes Alexa Angela Burke, MD  pravastatin (PRAVACHOL) 40 MG tablet TAKE 1 TABLET BY MOUTH DAILY. 02/07/16  Yes Nischal Narendra, MD  PROAIR HFA 108 (90 Base) MCG/ACT inhaler INHALE 1 PUFF INTO THE LUNGS EVERY 6 HOURS AS NEEDED FOR WHEEZING OR SHORTNESS OF BREATH.*NEED OFFICE VISIT FOR FURTHER REFILLS 05/16/16  Yes Tammy S Parrett, NP  QUEtiapine (SEROQUEL) 100 MG tablet Take 1 tablet (100 mg total) by mouth at bedtime. 05/15/16  Yes Bayard Hugger, NP  rOPINIRole (REQUIP) 0.5 MG tablet TAKE 1 TABLET  BY MOUTH AT BEDTIME FOR 1 WEEK THEN INCREASE TO 2 TABLETS AT BEDTIME. (STOP PRIMIDONE) Patient taking differently: Take 0.5 mg by mouth at bedtime as needed. Restless legs 05/15/16  Yes Bayard Hugger, NP  SPIRIVA HANDIHALER 18 MCG inhalation capsule PLACE 1 CAPSULE INTO INHALER AND INHALE DAILY. 10/13/15  Yes Chesley Mires, MD  tiZANidine (ZANAFLEX) 4 MG tablet Take 4 mg by mouth 2 (two) times daily as needed for muscle spasms. pain 01/19/16  Yes Historical Provider, MD  valACYclovir (VALTREX) 500 MG tablet TAKE 1 TABLET BY MOUTH DAILY. 05/29/15  Yes Alexa Angela Burke, MD  venlafaxine XR (EFFEXOR-XR) 150 MG 24 hr capsule TAKE 2 CAPSULES (300 MG TOTAL) BY MOUTH DAILY WITH BREAKFAST. Patient taking differently: take 300mg  at bedtime 06/20/15  Yes Alexa R Burns, MD  VOLTAREN 1 % GEL APPLY 2 GRAMS TO AFFECTED AREA 3 TIMES A DAY AS NEEDED FOR PAIN. 12/07/15  Yes Meredith Staggers, MD  fluticasone (FLONASE) 50 MCG/ACT nasal spray Place 1 spray into both nostrils daily. Patient taking differently: Place 1 spray into both nostrils daily as needed for allergies.  11/22/15 11/16/16  Alexa Angela Burke, MD  Psyllium (VEGETABLE LAXATIVE PO) Take 2 tablets by mouth  at bedtime. Reported on 10/24/2015    Historical Provider, MD    Family History Family History  Problem Relation Age of Onset  . Kidney disease Mother   . Heart disease Father   . Anuerysm Brother 29    brain  . Heart disease Brother   . Heart disease Sister 29    s/p CABG  . Hypertension Sister     Social History Social History  Substance Use Topics  . Smoking status: Current Every Day Smoker    Packs/day: 1.25    Years: 45.00    Types: Cigarettes  . Smokeless tobacco: Never Used  . Alcohol use No     Allergies   Amoxicillin; Chlorzoxazone; Codeine; Darvocet [propoxyphene n-acetaminophen]; Dilaudid [hydromorphone hcl]; Flagyl [metronidazole]; Keflex [cephalexin]; Morphine and related; Nitrofurantoin monohyd macro; Percocet [oxycodone-acetaminophen]; and Sulfa antibiotics   Review of Systems Review of Systems 10 Systems reviewed and are negative for acute change except as noted in the HPI.   Physical Exam Updated Vital Signs BP 152/81 (BP Location: Right Arm)   Pulse 75   Temp 98.9 F (37.2 C) (Oral)   Resp 12   SpO2 93%   Physical Exam  Constitutional: She is oriented to person, place, and time. She appears well-developed and well-nourished. No distress.  HENT:  Head: Normocephalic and atraumatic.  Moist mucous membranes  Eyes: Conjunctivae are normal. Pupils are equal, round, and reactive to light.  Neck: Neck supple.  Cardiovascular: Normal rate, regular rhythm and normal heart sounds.   No murmur heard. Pulmonary/Chest: Effort normal.  Occasional expiratory wheeze  Abdominal: Soft. Bowel sounds are normal. She exhibits no distension. There is no tenderness.  Musculoskeletal: She exhibits no edema.  Neurological: She is alert and oriented to person, place, and time.  Fluent speech  Skin: Skin is warm and dry.  Psychiatric: She has a normal mood and affect. Judgment normal.  Nursing note and vitals reviewed.    ED Treatments / Results  Labs (all  labs ordered are listed, but only abnormal results are displayed) Labs Reviewed  COMPREHENSIVE METABOLIC PANEL - Abnormal; Notable for the following:       Result Value   Creatinine, Ser 1.19 (*)    Calcium 8.2 (*)    AST 44 (*)  GFR calc non Af Amer 48 (*)    GFR calc Af Amer 56 (*)    All other components within normal limits  CBC WITH DIFFERENTIAL/PLATELET  URINALYSIS, ROUTINE W REFLEX MICROSCOPIC (NOT AT [facility])    EKG  EKG Interpretation None       Radiology No results found.  Procedures Procedures (including critical care time)  Medications Ordered in ED Medications  sodium chloride 0.9 % bolus 1,000 mL (0 mLs Intravenous Stopped 06/07/16 1848)  HYDROcodone-acetaminophen (NORCO/VICODIN) 5-325 MG per tablet 2 tablet (2 tablets Oral Given 06/07/16 2014)     Initial Impression / Assessment and Plan / ED Course  I have reviewed the triage vital signs and the nursing notes.  Pertinent labs & imaging results that were available during my care of the patient were reviewed by me and considered in my medical decision making (see chart for details).  Clinical Course   Patient with hypotension and lightheadedness at home, no recent medication changes and no recent illness. She was awake and alert on exam. Initial BP was 86/62 but repeat was 91/64. She was mentating appropriately, complaining of chronic pain in her side but no new pain. No infectious symptoms. No CP/SOB. Gave the patient an IV fluid bolus and obtained above labs which show BUN 18, creatinine 1.19 which is not significantly elevated from baseline. UA unremarkable. Patient has had no fevers or infectious symptoms to suggest that her hypotension is due to infection. Her blood pressure has improved after fluid bolus and she has been able to ambulate without problems. Repeat BP during ambulation is 152/81. I discussed her blood pressure medications and she states that she has been out of her amlodipine recently but has  continued to take benazapril and hydrochlorothiazide. I instructed her to discontinue hydrochlorothiazide and to follow closely with her PCP to discuss further BP management. Patient voiced understanding and was discharged and satisfactory condition.  Final Clinical Impressions(s) / ED Diagnoses   Final diagnoses:  Hypotension, unspecified hypotension type  Lightheadedness    New Prescriptions Discharge Medication List as of 06/07/2016  9:51 PM       Sharlett Iles, MD 06/08/16 [id number]

## 2016-06-07 NOTE — Discharge Instructions (Signed)
STOP TAKING HYDROCHLOROTHIAZIDE BUT CONTINUE BENAZAPRIL. FOLLOW UP WITH YOUR PRIMARY CARE PROVIDER TO DISCUSS YOUR BLOOD PRESSURE MEDICINES.

## 2016-06-07 NOTE — ED Notes (Signed)
Pt is wearing a Fentanyl patch for back pain.

## 2016-06-07 NOTE — ED Triage Notes (Signed)
Per EMS- Patient c/o dizziness. Patient was found to be hypotensive. BP-80/50, HR-76, CBG-168 Patient was given 600 ml NS prior to arrival to the ED.  Lying BP- 82/36 Sitting BP-72/36

## 2016-06-07 NOTE — ED Notes (Signed)
Bed: [facility] Expected date:  Expected time:  Means of arrival:  Comments: 

## 2016-06-10 ENCOUNTER — Other Ambulatory Visit: Payer: Medicare Other

## 2016-06-10 ENCOUNTER — Ambulatory Visit
Admission: RE | Admit: 2016-06-10 | Discharge: 2016-06-10 | Disposition: A | Discharge status: Home / Self Care | Payer: Medicare Other | Source: Ambulatory Visit | Attending: Specialist | Admitting: Specialist

## 2016-06-10 DIAGNOSIS — M545 Low back pain: Secondary | ICD-10-CM

## 2016-06-12 ENCOUNTER — Telehealth: Payer: Self-pay | Admitting: *Deleted

## 2016-06-12 ENCOUNTER — Encounter: Payer: Self-pay | Admitting: Registered Nurse

## 2016-06-12 ENCOUNTER — Telehealth: Payer: Self-pay | Admitting: Physical Medicine & Rehabilitation

## 2016-06-12 ENCOUNTER — Encounter: Payer: Medicare Other | Attending: Physical Medicine & Rehabilitation | Admitting: Registered Nurse

## 2016-06-12 VITALS — BP 130/84 | HR 89

## 2016-06-12 DIAGNOSIS — M961 Postlaminectomy syndrome, not elsewhere classified: Secondary | ICD-10-CM | POA: Diagnosis not present

## 2016-06-12 DIAGNOSIS — G9619 Other disorders of meninges, not elsewhere classified: Secondary | ICD-10-CM | POA: Diagnosis not present

## 2016-06-12 DIAGNOSIS — Z720 Tobacco use: Secondary | ICD-10-CM | POA: Diagnosis not present

## 2016-06-12 DIAGNOSIS — M2170 Unequal limb length (acquired), unspecified site: Secondary | ICD-10-CM | POA: Diagnosis not present

## 2016-06-12 DIAGNOSIS — M5126 Other intervertebral disc displacement, lumbar region: Secondary | ICD-10-CM | POA: Diagnosis not present

## 2016-06-12 DIAGNOSIS — I10 Essential (primary) hypertension: Secondary | ICD-10-CM | POA: Diagnosis not present

## 2016-06-12 DIAGNOSIS — M5136 Other intervertebral disc degeneration, lumbar region: Secondary | ICD-10-CM

## 2016-06-12 DIAGNOSIS — R0902 Hypoxemia: Secondary | ICD-10-CM

## 2016-06-12 DIAGNOSIS — K219 Gastro-esophageal reflux disease without esophagitis: Secondary | ICD-10-CM | POA: Diagnosis not present

## 2016-06-12 DIAGNOSIS — J449 Chronic obstructive pulmonary disease, unspecified: Secondary | ICD-10-CM | POA: Insufficient documentation

## 2016-06-12 DIAGNOSIS — Z9889 Other specified postprocedural states: Secondary | ICD-10-CM | POA: Diagnosis not present

## 2016-06-12 DIAGNOSIS — M791 Myalgia: Secondary | ICD-10-CM

## 2016-06-12 DIAGNOSIS — M62830 Muscle spasm of back: Secondary | ICD-10-CM

## 2016-06-12 DIAGNOSIS — E079 Disorder of thyroid, unspecified: Secondary | ICD-10-CM | POA: Diagnosis not present

## 2016-06-12 DIAGNOSIS — G8929 Other chronic pain: Secondary | ICD-10-CM

## 2016-06-12 DIAGNOSIS — G894 Chronic pain syndrome: Secondary | ICD-10-CM | POA: Insufficient documentation

## 2016-06-12 DIAGNOSIS — Z9981 Dependence on supplemental oxygen: Secondary | ICD-10-CM | POA: Diagnosis not present

## 2016-06-12 DIAGNOSIS — Z5181 Encounter for therapeutic drug level monitoring: Secondary | ICD-10-CM | POA: Diagnosis not present

## 2016-06-12 DIAGNOSIS — M546 Pain in thoracic spine: Secondary | ICD-10-CM

## 2016-06-12 DIAGNOSIS — R0781 Pleurodynia: Secondary | ICD-10-CM

## 2016-06-12 DIAGNOSIS — G2581 Restless legs syndrome: Secondary | ICD-10-CM

## 2016-06-12 DIAGNOSIS — M7918 Myalgia, other site: Secondary | ICD-10-CM

## 2016-06-12 DIAGNOSIS — E039 Hypothyroidism, unspecified: Secondary | ICD-10-CM | POA: Diagnosis not present

## 2016-06-12 DIAGNOSIS — E785 Hyperlipidemia, unspecified: Secondary | ICD-10-CM | POA: Diagnosis not present

## 2016-06-12 DIAGNOSIS — Z79899 Other long term (current) drug therapy: Secondary | ICD-10-CM | POA: Insufficient documentation

## 2016-06-12 DIAGNOSIS — M170 Bilateral primary osteoarthritis of knee: Secondary | ICD-10-CM | POA: Diagnosis not present

## 2016-06-12 DIAGNOSIS — M4316 Spondylolisthesis, lumbar region: Secondary | ICD-10-CM

## 2016-06-12 MED ORDER — FENTANYL 50 MCG/HR TD PT72: 50.0000 ug | MEDICATED_PATCH | TRANSDERMAL | 0 refills | Status: DC

## 2016-06-12 MED ORDER — HYDROCODONE-ACETAMINOPHEN 10-325 MG PO TABS: 1.0000 | ORAL_TABLET | Freq: Four times a day (QID) | ORAL | 0 refills | Status: DC | PRN

## 2016-06-12 NOTE — Progress Notes (Signed)
Subjective:    Patient ID: Jill Phillips, female    DOB: Jul 24, 1956, 60 y.o.   MRN: [id number]  HPI: [name] is a 60 year old female who returns for follow up for chronic pain and medication refill. She states her pain is located in her left rib and lower back. She rates her pain 6. Her current exercise regime is walking and performing stretching exercises.  Arrived with oxygen desaturation O2 saturation 89%, repeat O2 saturation 90-92%. She states she has oxygen at home, but hasn't used it in years. She refused ED evaluation she will follow up with her PCP and Pulmonologist.    Pain Inventory Average Pain 7 Pain Right Now 6 My pain is burning, stabbing and aching  In the last 24 hours, has pain interfered with the following? General activity 8 Relation with others 7 Enjoyment of life 0 What TIME of day is your pain at its worst? morning, evening Sleep (in general) Good  Pain is worse with: walking, standing and some activites Pain improves with: rest and medication Relief from Meds: 7  Mobility walk without assistance do you drive?  no Do you have any goals in this area?  no  Function I need assistance with the following:  household duties  Neuro/Psych No problems in this area  Prior Studies Any changes since last visit?  yes CT/MRI  Physicians involved in your care Any changes since last visit?  no   Family History  Problem Relation Age of Onset  . Kidney disease Mother   . Heart disease Father   . Anuerysm Brother 29    brain  . Heart disease Brother   . Heart disease Sister 79    s/p CABG  . Hypertension Sister    Social History   Social History  . Marital status: Divorced    Spouse name: n/a  . Number of children: 2  . Years of education: 12+   Occupational History  . disability     back surgeries   Social History Main Topics  . Smoking status: Current Every Day Smoker    Packs/day: 1.25    Years: 45.00    Types: Cigarettes  .  Smokeless tobacco: Never Used  . Alcohol use No  . Drug use:     Types: Marijuana     Comment: 01/31/2016 "none since ~ 1980"  . Sexual activity: No   Other Topics Concern  . Not on file   Social History Narrative   Lives alone.  One daughter is local, but is getting ready to move to [place], where her children live with their father.  The other daughter lives near [place], [place].   Past Surgical History:  Procedure Laterality Date  . APPENDECTOMY    . BACK SURGERY     18 back surgeries (2 thoracic & 16 lumbar) (01/31/2016)  . DILATION AND CURETTAGE OF UTERUS    . HAMMER TOE SURGERY    . JOINT REPLACEMENT    . KNEE ARTHROSCOPY Right   . LAPAROSCOPIC CHOLECYSTECTOMY    . LUMBAR FUSION N/A 08/01/2015   Procedure: Right sided L1-2 and L2-3 transforaminal lumbar interbody fusion with cages, Extension of posterior fusion T12 to L3, Replaced pedicle screws bilaterally L1-L2 , Replaced left sided pedicle screws L-3. Instrumentation T12 to L3 using local bone graft, Vivigen allograft and cancellous chips;  Surgeon: [name], MD;  Location: [facility];  Service: Orthopedics;  Laterality: N/A;  . LUMBAR LAMINECTOMY/DECOMPRESSION MICRODISCECTOMY N/A  01/28/2014   Procedure: Minimally Invasive Right  L1-2 Microdiscectomy;  Surgeon: [name], MD;  Location: [facility];  Service: Orthopedics;  Laterality: N/A;  . TONSILLECTOMY    . TOTAL HIP ARTHROPLASTY Right   . TOTAL KNEE ARTHROPLASTY  05/29/2012   Procedure: TOTAL KNEE ARTHROPLASTY;  Surgeon: Mcarthur Rossetti, MD;  Location: [facility];  Service: Orthopedics;  Laterality: Right;  Right Total Knee Arthroplasty  . TUBAL LIGATION    . UVULOPALATOPHARYNGOPLASTY    . VAGINAL HYSTERECTOMY     Past Medical History:  Diagnosis Date  . Active smoker   . Anxiety   . Calcifying tendinitis of shoulder   . Chronic back pain   . Chronic pain syndrome   . COPD (chronic obstructive pulmonary disease) ([place])   . Dysthymic disorder   . Emphysema lung  ([place])   . GERD (gastroesophageal reflux disease)   . Headache    "weekly maybe" (01/31/2016)  . Heart murmur    for years, nothing to be concerned about  . Herpes genitalia   . History of blood transfusion 1980   related to "back surgery"  . Hyperlipidemia   . Hypertension   . Hypothyroidism   . Lumbago   . Osteoarthrosis, unspecified whether generalized or localized, lower leg   . Pain in joint, upper arm   . Pneumothorax, left 01/31/2016   S/P Left posterior subcostal pain injection on 01/30/2016  . PONV (postoperative nausea and vomiting)    gets nauseous  with longer surgery. Difficuty voiding after surgery  . Postlaminectomy syndrome, thoracic region   . Primary localized osteoarthrosis, lower leg   . Restless leg syndrome   . Sleep apnea    s/p surgery- last sleep study 2011- doesnt use oxygen or machine at night as instructed,.   12/2014- Dr Halford Chessman  reports it is negative.  . Thyroid disease    There were no vitals taken for this visit.  Opioid Risk Score:   Fall Risk Score:  `1  Depression screen PHQ 2/9  Depression screen [facility] 2/9 05/15/2016 05/02/2016 12/07/2015 11/15/2015 10/13/2015 09/06/2015 05/22/2015  Decreased Interest 0 0 0 0 0 0 0  Down, Depressed, Hopeless 0 0 0 0 - 0 0  PHQ - 2 Score 0 0 0 0 0 0 0  Altered sleeping - - - - - - -  Tired, decreased energy - - - - - - -  Change in appetite - - - - - - -  Feeling bad or failure about yourself  - - - - - - -  Trouble concentrating - - - - - - -  Moving slowly or fidgety/restless - - - - - - -  Suicidal thoughts - - - - - - -  PHQ-9 Score - - - - - - -  Some recent data might be hidden     Review of Systems  Gastrointestinal: Positive for constipation.  Endocrine:       High blood sugar  All other systems reviewed and are negative.      Objective:   Physical Exam  Constitutional: She is oriented to person, place, and time. She appears well-developed and well-nourished.  HENT:  Head: Normocephalic and  atraumatic.  Neck: Normal range of motion. Neck supple.  Cardiovascular: Normal rate and regular rhythm.   Pulmonary/Chest: Effort normal and breath sounds normal.  Musculoskeletal:  Normal Muscle Bulk and Muscle Testing Reveals: Upper Extremities: Full ROM and Muscle Strength 5/5 Lumbar Paraspinal Tenderness: L-3-L-5 Lower  Extremities: Full ROM and Muscle Strength 5/5 Arises from chair with ease  Narrow Based Gait  Neurological: She is alert and oriented to person, place, and time.  Skin: Skin is warm and dry.  Psychiatric: She has a normal mood and affect.  Nursing note and vitals reviewed.         Assessment & Plan:  1. Chronic lumbar spine pain/post-lami syndrome: Refilled: Fentanyl 50 MCG one patch every three days #10 and Hydrocodone 10/325 mg one tablet every 6 hours as needed for pain #120. We will continue the opioid monitoring program, this consists of regular clinic visits, examinations, urine drug screen, pill counts as well as use of [place] Controlled Substance Reporting System. 2. Osteoarthritis of the knees bilaterally:No complaints today. Continue with Heat, exercise and voltaren gel.  3. Rotator cuff syndrome/subacromial bursitis: No complaints voiced today. 4. Restless legs syndrome: Continue Requip.Continue to monitor 5. Tobacco Abuse: Encourage Smoking Cessation: Pulmonologist Following 6. Emphysema: Pulmonology Following 7. Muscle Spasm: Continue Soma 8. Insomnia: Continue Seroquel 9. Oxygen Desaturation: Refuses ED evaluation. Will F/U with her PCP and  Pulmonologist.  20 minutes of face to face patient care time was spent during this visit. All questions were encouraged and answered.   F/u in 1 month

## 2016-06-12 NOTE — Telephone Encounter (Signed)
OK thank you 

## 2016-06-12 NOTE — Telephone Encounter (Signed)
Patient wanted to let Jill Phillips know that she has an appointment with her medical doctor on 06/25/16 and her pulmonary doctor on 06/25/16.

## 2016-06-12 NOTE — Telephone Encounter (Signed)
Pt states she went to the pain clinic today and the person that took her vital signs told her she had extremely high blood pressure and extremely low 02 sats. She states she has spells from time to time having low bp and feels tired and weak lots of times, states she has gone to the ED for the problem and she doesn't have home 02, "maybe I need that" she also talks about her pain and how the doctor "messed up" and punctured her lung and that it has caused her problems. She talks about all different kinds of reasons for her problems. She was initially offered an appt tomorrow in [facility] and she stated she could not come for 10 days in order to arrange transportation. She was then given an appt w/ dr burns for 10/25. Then she said she could come tomorrow and was given an appt in [facility].  She was also instructed to call 911 if she had chest pain, shortness of breath, weakness, severe h/a, N&V, vision changes, strength changes in her arms/ legs. She wa sagreeable

## 2016-06-13 ENCOUNTER — Other Ambulatory Visit: Payer: Self-pay | Admitting: Registered Nurse

## 2016-06-13 ENCOUNTER — Ambulatory Visit: Payer: Medicare Other

## 2016-06-13 DIAGNOSIS — Z5181 Encounter for therapeutic drug level monitoring: Secondary | ICD-10-CM

## 2016-06-13 DIAGNOSIS — M4316 Spondylolisthesis, lumbar region: Secondary | ICD-10-CM

## 2016-06-13 DIAGNOSIS — M7918 Myalgia, other site: Secondary | ICD-10-CM

## 2016-06-13 DIAGNOSIS — G894 Chronic pain syndrome: Secondary | ICD-10-CM

## 2016-06-13 DIAGNOSIS — Z79899 Other long term (current) drug therapy: Secondary | ICD-10-CM

## 2016-06-13 DIAGNOSIS — M5136 Other intervertebral disc degeneration, lumbar region: Secondary | ICD-10-CM

## 2016-06-13 NOTE — Telephone Encounter (Signed)
Jill Phillips called office on 06/12/2016 statting she has F/U appointment scheduled with her PCP and Pulmonologist on 06/25/2016.

## 2016-06-14 ENCOUNTER — Other Ambulatory Visit: Payer: Medicare Other

## 2016-06-14 ENCOUNTER — Telehealth: Payer: Self-pay | Admitting: Pulmonary Disease

## 2016-06-14 ENCOUNTER — Encounter: Payer: Self-pay | Admitting: Internal Medicine

## 2016-06-14 MED ORDER — NICOTINE 21 MG/24HR TD PT24: 21.0000 mg | MEDICATED_PATCH | Freq: Every day | TRANSDERMAL | 0 refills | Status: DC

## 2016-06-14 NOTE — Telephone Encounter (Signed)
Patient states that when she went to the pain clinic today, her O2 dropped to 86%.  She said that she thinks that her sleep apnea has come back.  She said she is having a lot of trouble with her back and in Feb.  She started hurting in the right rib, around the side, she said in May, she had a collapsed lung.  She said that she had to have a chest tube placed to repair collapsed lung, says that her side and ribs are still hurting.  She says she lays with ice on her ribs, but it does not help.  She said if she sits up it helps with the pain.  Patient started back on O2, she is using 2L.  She said that she is tired all the time, she said that when she saw Dr. Halford Chessman last time, she was told she did not have Sleep Apnea, but she says that she thinks that she has it now.  She states that she is sleeping all the time.  Patient says the only way she can get to the doctor is by using [employer] and the next available appointment for them to drive her will not be until November.  Patient states that she also wants to get on a patch to help her quit smoking.  She said that she needs the strongest one she can get.   Dr. Halford Chessman, please advise.

## 2016-06-14 NOTE — Telephone Encounter (Signed)
Unfortunately, she would need to have a face to face evaluation prior to having any additional sleep testing done >> please schedule ROV.  She can get nicotine patch OTC >> she can start with 21 mg strength.

## 2016-06-14 NOTE — Telephone Encounter (Signed)
Pt is aware that 21mg  patches (nicotine) has been sent to [facility] on [address]. Pt has follow up OV with TP 07/2016. Nothing more needed at this time.

## 2016-06-14 NOTE — Telephone Encounter (Signed)
Called and spoke with pt and she stated that she needs the patches sent in as an rx since she can get these very cheap if it is written as rx.  VS please advise if we can order this.  thanks

## 2016-06-14 NOTE — Telephone Encounter (Signed)
Okay to send script  

## 2016-06-16 DIAGNOSIS — G4734 Idiopathic sleep related nonobstructive alveolar hypoventilation: Secondary | ICD-10-CM | POA: Diagnosis not present

## 2016-06-19 ENCOUNTER — Ambulatory Visit (INDEPENDENT_AMBULATORY_CARE_PROVIDER_SITE_OTHER): Payer: Medicare Other | Admitting: Specialist

## 2016-06-20 ENCOUNTER — Ambulatory Visit (INDEPENDENT_AMBULATORY_CARE_PROVIDER_SITE_OTHER): Payer: Medicare Other | Admitting: Specialist

## 2016-06-20 DIAGNOSIS — M4724 Other spondylosis with radiculopathy, thoracic region: Secondary | ICD-10-CM

## 2016-06-20 DIAGNOSIS — M960 Pseudarthrosis after fusion or arthrodesis: Secondary | ICD-10-CM | POA: Diagnosis not present

## 2016-06-20 DIAGNOSIS — M1812 Unilateral primary osteoarthritis of first carpometacarpal joint, left hand: Secondary | ICD-10-CM | POA: Diagnosis not present

## 2016-06-21 ENCOUNTER — Other Ambulatory Visit: Payer: Medicare Other

## 2016-06-21 ENCOUNTER — Other Ambulatory Visit: Payer: Self-pay | Admitting: Physical Medicine & Rehabilitation

## 2016-06-24 ENCOUNTER — Other Ambulatory Visit: Payer: Medicare Other

## 2016-06-24 ENCOUNTER — Other Ambulatory Visit (INDEPENDENT_AMBULATORY_CARE_PROVIDER_SITE_OTHER): Payer: Self-pay | Admitting: Specialist

## 2016-06-24 DIAGNOSIS — M546 Pain in thoracic spine: Secondary | ICD-10-CM

## 2016-06-25 ENCOUNTER — Ambulatory Visit: Payer: Medicare Other | Admitting: Adult Health

## 2016-06-26 ENCOUNTER — Other Ambulatory Visit: Payer: Self-pay | Admitting: Internal Medicine

## 2016-06-26 ENCOUNTER — Other Ambulatory Visit (INDEPENDENT_AMBULATORY_CARE_PROVIDER_SITE_OTHER): Payer: Self-pay | Admitting: Specialist

## 2016-06-26 ENCOUNTER — Encounter: Payer: Medicare Other | Admitting: Internal Medicine

## 2016-06-26 NOTE — Telephone Encounter (Signed)
Ok to refill 

## 2016-06-26 NOTE — Telephone Encounter (Signed)
Rx for tizanidine refill approved and entered into EPIC.

## 2016-07-09 ENCOUNTER — Other Ambulatory Visit: Payer: Medicare Other

## 2016-07-09 ENCOUNTER — Encounter: Payer: Self-pay | Admitting: Physical Medicine & Rehabilitation

## 2016-07-09 ENCOUNTER — Encounter: Payer: Medicare Other | Attending: Physical Medicine & Rehabilitation | Admitting: Physical Medicine & Rehabilitation

## 2016-07-09 VITALS — BP 133/81 | HR 100 | Resp 14

## 2016-07-09 DIAGNOSIS — E039 Hypothyroidism, unspecified: Secondary | ICD-10-CM | POA: Insufficient documentation

## 2016-07-09 DIAGNOSIS — Z720 Tobacco use: Secondary | ICD-10-CM | POA: Diagnosis not present

## 2016-07-09 DIAGNOSIS — G9619 Other disorders of meninges, not elsewhere classified: Secondary | ICD-10-CM | POA: Insufficient documentation

## 2016-07-09 DIAGNOSIS — M7918 Myalgia, other site: Secondary | ICD-10-CM

## 2016-07-09 DIAGNOSIS — Z5181 Encounter for therapeutic drug level monitoring: Secondary | ICD-10-CM | POA: Insufficient documentation

## 2016-07-09 DIAGNOSIS — I10 Essential (primary) hypertension: Secondary | ICD-10-CM | POA: Insufficient documentation

## 2016-07-09 DIAGNOSIS — Z9889 Other specified postprocedural states: Secondary | ICD-10-CM | POA: Diagnosis not present

## 2016-07-09 DIAGNOSIS — M961 Postlaminectomy syndrome, not elsewhere classified: Secondary | ICD-10-CM | POA: Insufficient documentation

## 2016-07-09 DIAGNOSIS — G894 Chronic pain syndrome: Secondary | ICD-10-CM | POA: Diagnosis not present

## 2016-07-09 DIAGNOSIS — J449 Chronic obstructive pulmonary disease, unspecified: Secondary | ICD-10-CM | POA: Insufficient documentation

## 2016-07-09 DIAGNOSIS — Z9981 Dependence on supplemental oxygen: Secondary | ICD-10-CM | POA: Diagnosis not present

## 2016-07-09 DIAGNOSIS — M1812 Unilateral primary osteoarthritis of first carpometacarpal joint, left hand: Secondary | ICD-10-CM | POA: Diagnosis not present

## 2016-07-09 DIAGNOSIS — M791 Myalgia: Secondary | ICD-10-CM

## 2016-07-09 DIAGNOSIS — M170 Bilateral primary osteoarthritis of knee: Secondary | ICD-10-CM | POA: Diagnosis not present

## 2016-07-09 DIAGNOSIS — Z79899 Other long term (current) drug therapy: Secondary | ICD-10-CM | POA: Diagnosis not present

## 2016-07-09 DIAGNOSIS — E079 Disorder of thyroid, unspecified: Secondary | ICD-10-CM | POA: Diagnosis not present

## 2016-07-09 DIAGNOSIS — M4316 Spondylolisthesis, lumbar region: Secondary | ICD-10-CM

## 2016-07-09 DIAGNOSIS — M5136 Other intervertebral disc degeneration, lumbar region: Secondary | ICD-10-CM | POA: Diagnosis not present

## 2016-07-09 DIAGNOSIS — K219 Gastro-esophageal reflux disease without esophagitis: Secondary | ICD-10-CM | POA: Diagnosis not present

## 2016-07-09 DIAGNOSIS — E785 Hyperlipidemia, unspecified: Secondary | ICD-10-CM | POA: Diagnosis not present

## 2016-07-09 DIAGNOSIS — G2581 Restless legs syndrome: Secondary | ICD-10-CM | POA: Insufficient documentation

## 2016-07-09 DIAGNOSIS — M5126 Other intervertebral disc displacement, lumbar region: Secondary | ICD-10-CM | POA: Insufficient documentation

## 2016-07-09 DIAGNOSIS — M2170 Unequal limb length (acquired), unspecified site: Secondary | ICD-10-CM | POA: Diagnosis not present

## 2016-07-09 MED ORDER — FENTANYL 50 MCG/HR TD PT72: 50.0000 ug | MEDICATED_PATCH | TRANSDERMAL | 0 refills | Status: DC

## 2016-07-09 MED ORDER — HYDROCODONE-ACETAMINOPHEN 10-325 MG PO TABS: 1.0000 | ORAL_TABLET | Freq: Four times a day (QID) | ORAL | 0 refills | Status: DC | PRN

## 2016-07-09 NOTE — Patient Instructions (Signed)
PLEASE CALL ME WITH ANY PROBLEMS OR QUESTIONS ([phone])  

## 2016-07-09 NOTE — Progress Notes (Signed)
Subjective:    Patient ID: Jill Phillips, female    DOB: 01-24-1956, 60 y.o.   MRN: [id number]  HPI   Jill Phillips is here in follow up of her chronic pain. She has been busy since I last saw her with thoracic spine injections and ?associated left PTX. She is going for epidurography later today based on the following lumbar study from September.    T11-12:  Solid fusion with no residual neural impingement.  T12-L1: Solid posterior fusion. Very tiny central disc bulge. Otherwise normal.  L1-2: Partial fusion of the L1-2 vertebral bodies and the disc space. Small area of solid fusion of posterior elements seen on image 29 of series 8.  L2-3: No appreciable solid interbody or posterior fusion at this time. No loosening of the hardware. Widely patent neural foramina. Slight scarring around the right side of the thecal sac.  L3-4 through L5-S1: Solid posterior and interbody fusions with excellent posterior decompression. See scarring around the thecal sac to the expected degree  In addition to her back she's had increasing pain in her left thumb. It's been hard for her to grasp simple objects due to pain. She feels constant grinding. Dr. [name] apparently gave her some kind of sleeve to wear on wrist/thumb.   I also found records of an ED visit from October for symptomatic "hypotension"  Jill Phillips remains under the care of Dr. Halford Chessman for her COPD. She reports occasional shortness of breath and low oxygen readings at times (she has a home pulse ox)  Pain Inventory Average Pain 5 Pain Right Now 6 My pain is tingling and aching  In the last 24 hours, has pain interfered with the following? General activity no selection Relation with others   Enjoyment of life no selection What TIME of day is your pain at its worst? varies Sleep (in general) Fair  Pain is worse with: walking, standing and some activites Pain improves with: rest and medication Relief from Meds: 8  Mobility Do you have  any goals in this area?  no  Function Do you have any goals in this area?  no  Neuro/Psych bladder control problems numbness tingling trouble walking spasms  Prior Studies Any changes since last visit?  no  Physicians involved in your care Any changes since last visit?  no   Family History  Problem Relation Age of Onset  . Kidney disease Mother   . Heart disease Father   . Anuerysm Brother 29    brain  . Heart disease Brother   . Heart disease Sister 37    s/p CABG  . Hypertension Sister    Social History   Social History  . Marital status: Divorced    Spouse name: n/a  . Number of children: 2  . Years of education: 12+   Occupational History  . disability     back surgeries   Social History Main Topics  . Smoking status: Current Every Day Smoker    Packs/day: 1.25    Years: 45.00    Types: Cigarettes  . Smokeless tobacco: Never Used  . Alcohol use No  . Drug use:     Types: Marijuana     Comment: 01/31/2016 "none since ~ 1980"  . Sexual activity: No   Other Topics Concern  . None   Social History Narrative   Lives alone.  One daughter is local, but is getting ready to move to [place], where her children live with their father.  The other daughter  lives near [place], [place].   Past Surgical History:  Procedure Laterality Date  . APPENDECTOMY    . BACK SURGERY     18 back surgeries (2 thoracic & 16 lumbar) (01/31/2016)  . DILATION AND CURETTAGE OF UTERUS    . HAMMER TOE SURGERY    . JOINT REPLACEMENT    . KNEE ARTHROSCOPY Right   . LAPAROSCOPIC CHOLECYSTECTOMY    . LUMBAR FUSION N/A 08/01/2015   Procedure: Right sided L1-2 and L2-3 transforaminal lumbar interbody fusion with cages, Extension of posterior fusion T12 to L3, Replaced pedicle screws bilaterally L1-L2 , Replaced left sided pedicle screws L-3. Instrumentation T12 to L3 using local bone graft, Vivigen allograft and cancellous chips;  Surgeon: [name], MD;  Location: [place];  Service:  Orthopedics;  Laterality: N/A;  . LUMBAR LAMINECTOMY/DECOMPRESSION MICRODISCECTOMY N/A 01/28/2014   Procedure: Minimally Invasive Right  L1-2 Microdiscectomy;  Surgeon: [name], MD;  Location: [facility];  Service: Orthopedics;  Laterality: N/A;  . TONSILLECTOMY    . TOTAL HIP ARTHROPLASTY Right   . TOTAL KNEE ARTHROPLASTY  05/29/2012   Procedure: TOTAL KNEE ARTHROPLASTY;  Surgeon: Mcarthur Rossetti, MD;  Location: [facility];  Service: Orthopedics;  Laterality: Right;  Right Total Knee Arthroplasty  . TUBAL LIGATION    . UVULOPALATOPHARYNGOPLASTY    . VAGINAL HYSTERECTOMY     Past Medical History:  Diagnosis Date  . Active smoker   . Anxiety   . Calcifying tendinitis of shoulder   . Chronic back pain   . Chronic pain syndrome   . COPD (chronic obstructive pulmonary disease) ([place])   . Dysthymic disorder   . Emphysema lung ([place])   . GERD (gastroesophageal reflux disease)   . Headache    "weekly maybe" (01/31/2016)  . Heart murmur    for years, nothing to be concerned about  . Herpes genitalia   . History of blood transfusion 1980   related to "back surgery"  . Hyperlipidemia   . Hypertension   . Hypothyroidism   . Lumbago   . Osteoarthrosis, unspecified whether generalized or localized, lower leg   . Pain in joint, upper arm   . Pneumothorax, left 01/31/2016   S/P Left posterior subcostal pain injection on 01/30/2016  . PONV (postoperative nausea and vomiting)    gets nauseous  with longer surgery. Difficuty voiding after surgery  . Postlaminectomy syndrome, thoracic region   . Primary localized osteoarthrosis, lower leg   . Restless leg syndrome   . Sleep apnea    s/p surgery- last sleep study 2011- doesnt use oxygen or machine at night as instructed,.   12/2014- Dr Halford Chessman  reports it is negative.  . Thyroid disease    BP 133/81 (BP Location: Left Arm, Patient Position: Sitting, Cuff Size: Large)   Pulse 100   Resp 14   SpO2 (!) 88%   Opioid Risk Score:   Fall Risk  Score:  `1  Depression screen PHQ 2/9  Depression screen [facility] 2/9 05/15/2016 05/02/2016 12/07/2015 11/15/2015 10/13/2015 09/06/2015 05/22/2015  Decreased Interest 0 0 0 0 0 0 0  Down, Depressed, Hopeless 0 0 0 0 - 0 0  PHQ - 2 Score 0 0 0 0 0 0 0  Altered sleeping - - - - - - -  Tired, decreased energy - - - - - - -  Change in appetite - - - - - - -  Feeling bad or failure about yourself  - - - - - - -  Trouble concentrating - - - - - - -  Moving slowly or fidgety/restless - - - - - - -  Suicidal thoughts - - - - - - -  PHQ-9 Score - - - - - - -  Some recent data might be hidden    Review of Systems  Constitutional: Negative.   HENT: Negative.   Eyes: Negative.   Respiratory: Positive for shortness of breath and wheezing.   Gastrointestinal: Negative.   Endocrine: Negative.   Musculoskeletal: Positive for arthralgias, back pain, gait problem and myalgias.       Spasms  Allergic/Immunologic: Negative.   Neurological: Positive for numbness.       Tingling  All other systems reviewed and are negative.      Objective:   Physical Exam  Constitutional: She is oriented to person, place, and time. She appears well-developed and well-nourished.  HENT:  Head: Normocephalic.  Eyes: EOM are normal. Pupils are equal, round, and reactive to light.  Neck: Normal range of motion.  Cardiovascular: Normal rate.  Pulmonary/Chest: Effort normal.  Abdominal: Soft.  Musculoskeletal:  Right knee with minimal tenderness. Post-op scars noted. Knee rom 120+ with flexion. Weight bearing better with minimal antalgia seen.  Right shoulder with only mild tenderness with ROM. No shoulder instability noted. Had full AROM. Low back/pelvic with persistent hemi-elevation of the left pelvis and tightness of the lumbar paraspinals on the left. Her last rib comes close to contacting the left iliac crest. She developed substantial pain after I palpated in this area.. Low back ROM is limited today due to pain.  Sensory exam in tact in either leg. Strength fairly functional Neurological: She is alert and oriented to person, place, and time.  Skin: Skin is warm.  Psychiatric: She has a normal mood and affect. Her behavior is normal. Judgment and thought content normal. She was very alert.  Assessment & Plan:   ASSESSMENT:  1. 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 Has elevation of left hemipelvis and substantial spasm. 2. Osteoarthritis of the knees bilaterally, right greater than left.  3. Rotator cuff syndrome/subacromial bursitis.  4. Restless legs syndrome.  5. O2 dependent at night  6. Tobacco abuse.  7. Right leg length discrepancy. 8. Left CMC arthritis    PLAN:  1. Refilled fentanyl to 50 mcg every 72 hours. Hydrocodone was refilled #120. 2. Continue with 4mg  klonopin at night. Continue with gabapentin also.   3. After informed consent and preparation of the skin with betadine and isopropyl alcohol, I injected 3mg  (1.5cc) of celestone and 1.5cc of 1% lidocaine into the left CMC via anterior approach. Additionally, aspiration was performed prior to injection. The patient tolerated well, and no complications were encountered. Afterward the area was cleaned and dressed. Post- injection instructions were provided.   4. Asked her to follow up with pulmonology about the low O2 sats. May need adjustments to her regimen. She continues to heavily smoke as well. I advised her to use her spiriva daily as written as well.  6. Follow up with me or NP in 1 month. 30 minutes of face to face patient care time were spent during this visit. All questions were encouraged and answered.

## 2016-07-17 DIAGNOSIS — G4734 Idiopathic sleep related nonobstructive alveolar hypoventilation: Secondary | ICD-10-CM | POA: Diagnosis not present

## 2016-07-18 ENCOUNTER — Ambulatory Visit
Admission: RE | Admit: 2016-07-18 | Discharge: 2016-07-18 | Disposition: A | Discharge status: Home / Self Care | Payer: Medicare Other | Source: Ambulatory Visit | Attending: Specialist | Admitting: Specialist

## 2016-07-18 DIAGNOSIS — M546 Pain in thoracic spine: Secondary | ICD-10-CM

## 2016-07-18 DIAGNOSIS — M47814 Spondylosis without myelopathy or radiculopathy, thoracic region: Secondary | ICD-10-CM | POA: Diagnosis not present

## 2016-07-18 MED ORDER — IOPAMIDOL (ISOVUE-M 300) INJECTION 61%
1.0000 mL | Freq: Once | INTRAMUSCULAR | Status: AC | PRN
  Administered 2016-07-18: 1 mL via EPIDURAL

## 2016-07-18 MED ORDER — TRIAMCINOLONE ACETONIDE 40 MG/ML IJ SUSP (RADIOLOGY)
60.0000 mg | Freq: Once | INTRAMUSCULAR | Status: AC
  Administered 2016-07-18: 60 mg via EPIDURAL

## 2016-07-18 NOTE — Discharge Instructions (Signed)

## 2016-07-19 ENCOUNTER — Other Ambulatory Visit: Payer: Self-pay | Admitting: Physical Medicine & Rehabilitation

## 2016-07-29 ENCOUNTER — Encounter: Payer: Self-pay | Admitting: Adult Health

## 2016-07-29 ENCOUNTER — Ambulatory Visit (INDEPENDENT_AMBULATORY_CARE_PROVIDER_SITE_OTHER): Payer: Medicare Other | Admitting: Adult Health

## 2016-07-29 VITALS — BP 110/68 | HR 82 | Temp 97.8°F | Ht 63.0 in | Wt 158.2 lb

## 2016-07-29 DIAGNOSIS — Z23 Encounter for immunization: Secondary | ICD-10-CM

## 2016-07-29 DIAGNOSIS — Z720 Tobacco use: Secondary | ICD-10-CM

## 2016-07-29 DIAGNOSIS — J9611 Chronic respiratory failure with hypoxia: Secondary | ICD-10-CM | POA: Diagnosis not present

## 2016-07-29 DIAGNOSIS — J9610 Chronic respiratory failure, unspecified whether with hypoxia or hypercapnia: Secondary | ICD-10-CM | POA: Insufficient documentation

## 2016-07-29 DIAGNOSIS — J939 Pneumothorax, unspecified: Secondary | ICD-10-CM

## 2016-07-29 DIAGNOSIS — G4710 Hypersomnia, unspecified: Secondary | ICD-10-CM

## 2016-07-29 NOTE — Progress Notes (Signed)
Subjective:    Patient ID: Jill Phillips, female    DOB: [birth year], 60 y.o.   MRN: [id number]  HPI  59 yo female followed for sleep issues in past and nocturnal hypoxia on home O2 at 1l/m At bedtime  And Emphysema  Hx of OSA s/p UPPP w/ repeat sleep study in 2015 with no OSA noted.   TEST PSG 05/21/13 >> AHI 15.7, SaO2 low 82% PFT 05/26/14 >> FEV1 2.28 (88%), FEV1% 86, TLC 4.05 (80%), DLCO 65% PSG 08/29/14 >> AHI 1.1, SaO2 low 82%, spent 33.7 min with SaO2 < 88%, used 1 liter oxygen CT chest 11/23/14 >> mild paraseptal emphysema at apices  PMHx > HTN, HLD, Depression, GERD, Hypothyroidism     07/29/2016 Follow up : Low Oxygen levels  Pt presents for evaluation was seen at pain clinic and noted to have low oxygen levels . However on her home pulse oximeter was normal . She did not have increased dyspnea.  Today in office O2 sats on room air is 95% on room air at rest . Walking o2 sat 94% on RA .  She does have artificial nails .  Last seen 1.77yrs ago . At that time sleep study was repeated that did not show OSA . O2 sats were low and pt was started on o2 at 1l/m At bedtime  . She says she has not been wearing this.  She says she still has daytime sleepiness. She is on several pain medications , muscle relaxers and sleep aides. She watching TV in bed and sleeps some during datyime . We discussed healthy sleep regimen.  She denies chest pain, orthopnea,edema or fever.  Still smoking , discussed cessaiton .   Did have an iatrogenic PTX after nerve block with admission and chest tube. Follow up cxr w/ no PTX.[name]   Past Medical History:  Diagnosis Date  . Active smoker   . Anxiety   . Calcifying tendinitis of shoulder   . Chronic back pain   . Chronic pain syndrome   . COPD (chronic obstructive pulmonary disease) ([place])   . Dysthymic disorder   . Emphysema lung ([place])   . GERD (gastroesophageal reflux disease)   . Headache    "weekly maybe" (01/31/2016)  . Heart murmur    for  years, nothing to be concerned about  . Herpes genitalia   . History of blood transfusion 1980   related to "back surgery"  . Hyperlipidemia   . Hypertension   . Hypothyroidism   . Lumbago   . Osteoarthrosis, unspecified whether generalized or localized, lower leg   . Pain in joint, upper arm   . Pneumothorax, left 01/31/2016   S/P Left posterior subcostal pain injection on 01/30/2016  . PONV (postoperative nausea and vomiting)    gets nauseous  with longer surgery. Difficuty voiding after surgery  . Postlaminectomy syndrome, thoracic region   . Primary localized osteoarthrosis, lower leg   . Restless leg syndrome   . Sleep apnea    s/p surgery- last sleep study 2011- doesnt use oxygen or machine at night as instructed,.   12/2014- Dr Halford Chessman  reports it is negative.  . Thyroid disease      Review of Systems Constitutional:   No  weight loss, night sweats,  Fevers, chills, +fatigue, or  lassitude.  HEENT:   No headaches,  Difficulty swallowing,  Tooth/dental problems, or  Sore throat,  No sneezing, itching, ear ache, nasal congestion, post nasal drip,   CV:  No chest pain,  Orthopnea, PND, swelling in lower extremities, anasarca, dizziness, palpitations, syncope.   GI  No heartburn, indigestion, abdominal pain, nausea, vomiting, diarrhea, change in bowel habits, loss of appetite, bloody stools.   Resp:   No excess mucus, no productive cough,  No non-productive cough,  No coughing up of blood.  No change in color of mucus.  No wheezing.  No chest wall deformity  Skin: no rash or lesions.  GU: no dysuria, change in color of urine, no urgency or frequency.  No flank pain, no hematuria    Psych:  No change in mood or affect. No depression or anxiety.  No memory loss.         Objective:   Physical Exam Vitals:   07/29/16 1156  BP: 110/68  Pulse: 82  Temp: 97.8 F (36.6 C)  TempSrc: Oral  SpO2: 95%  Weight: 158 lb 3.2 oz (71.8 kg)  Height: 5\' 3"  (1.6 m)    GEN: A/Ox3; pleasant , NAD, well nourished    HEENT:  [place]/AT,  EACs-clear, TMs-wnl, NOSE-clear, THROAT-clear, no lesions, no postnasal drip or exudate noted.   NECK:  Supple w/ fair ROM; no JVD; normal carotid impulses w/o bruits; no thyromegaly or nodules palpated; no lymphadenopathy.    RESP  Clear  P & A; w/o, wheezes/ rales/ or rhonchi. no accessory muscle use, no dullness to percussion  CARD:  RRR, no m/r/g  , no peripheral edema, pulses intact, no cyanosis or clubbing.  GI:   Soft & nt; nml bowel sounds; no organomegaly or masses detected.   Musco: Warm bil, no deformities or joint swelling noted.   Neuro: alert, no focal deficits noted.    Skin: Warm, no lesions or rashes  Tammy Parrett NP-C  [facility]  07/29/2016        Assessment & Plan:

## 2016-07-29 NOTE — Assessment & Plan Note (Signed)
Smoking cessation discussed 

## 2016-07-29 NOTE — Patient Instructions (Addendum)
Flu shot today .  Work on not smoking.  Set up overnight oximetry test on room air to determine if you still need oxygen.  Follow up Dr. Halford Chessman  In 3 months and As needed   Please contact office for sooner follow up if symptoms do not improve or worsen or seek emergency care

## 2016-07-29 NOTE — Progress Notes (Signed)
Reviewed and agree with assessment/plan.  Chesley Mires, MD [facility] Pulmonary/Critical Care 07/29/2016, 4:33 PM Pager:  [phone]

## 2016-07-29 NOTE — Assessment & Plan Note (Signed)
Check ONO to see if O2 is still indicated at bedtime   Plan  Patient Instructions  Flu shot today .  Work on not smoking.  Set up overnight oximetry test on room air to determine if you still need oxygen.  Follow up Dr. Halford Chessman  In 3 months and As needed   Please contact office for sooner follow up if symptoms do not improve or worsen or seek emergency care

## 2016-07-29 NOTE — Assessment & Plan Note (Signed)
Suspect sleepiness is multifactoral , -poor sleep regimen and chronic narctotic use and medicaiton side effects.  Recent Sleep study in 2015 did not show OSA  And she has not had any significant weight gain.  Discussed healthy sleep regimen  If persists may need further testing.

## 2016-08-01 ENCOUNTER — Ambulatory Visit (INDEPENDENT_AMBULATORY_CARE_PROVIDER_SITE_OTHER): Payer: Medicare Other | Admitting: Specialist

## 2016-08-04 ENCOUNTER — Emergency Department (HOSPITAL_COMMUNITY)
Admission: EM | Admit: 2016-08-04 | Discharge: 2016-08-04 | Disposition: A | Discharge status: Home / Self Care | Payer: Medicare Other | Attending: Emergency Medicine | Admitting: Emergency Medicine

## 2016-08-04 ENCOUNTER — Encounter (HOSPITAL_COMMUNITY): Payer: Self-pay | Admitting: Emergency Medicine

## 2016-08-04 DIAGNOSIS — I10 Essential (primary) hypertension: Secondary | ICD-10-CM | POA: Insufficient documentation

## 2016-08-04 DIAGNOSIS — E039 Hypothyroidism, unspecified: Secondary | ICD-10-CM | POA: Diagnosis not present

## 2016-08-04 DIAGNOSIS — F17210 Nicotine dependence, cigarettes, uncomplicated: Secondary | ICD-10-CM | POA: Insufficient documentation

## 2016-08-04 DIAGNOSIS — B001 Herpesviral vesicular dermatitis: Secondary | ICD-10-CM

## 2016-08-04 DIAGNOSIS — J449 Chronic obstructive pulmonary disease, unspecified: Secondary | ICD-10-CM | POA: Insufficient documentation

## 2016-08-04 DIAGNOSIS — R51 Headache: Secondary | ICD-10-CM | POA: Insufficient documentation

## 2016-08-04 DIAGNOSIS — Z96651 Presence of right artificial knee joint: Secondary | ICD-10-CM | POA: Diagnosis not present

## 2016-08-04 DIAGNOSIS — Z96641 Presence of right artificial hip joint: Secondary | ICD-10-CM | POA: Insufficient documentation

## 2016-08-04 DIAGNOSIS — R519 Headache, unspecified: Secondary | ICD-10-CM

## 2016-08-04 MED ORDER — PREDNISONE 20 MG PO TABS: ORAL_TABLET | ORAL | 0 refills | Status: DC

## 2016-08-04 MED ORDER — VALACYCLOVIR HCL 1 G PO TABS: 1000.0000 mg | ORAL_TABLET | Freq: Three times a day (TID) | ORAL | 0 refills | Status: AC

## 2016-08-04 NOTE — ED Provider Notes (Signed)
[facility] DEPT Provider Note   CSN: [id number] Arrival date & time: 08/04/16  1051     History   Chief Complaint Chief Complaint  Patient presents with  . Oral Swelling  . Facial Pain  . Headache    HPI Jill Phillips is a 60 y.o. female.  HPI Patient reports that she had fallen asleep with a coffee cup by her mouth and her lip was pressed up against it. At first, she thought that the area was just sore from that having happened. She reports however the pain has persisted since yesterday morning and she has discomfort over the left side of her face and head. She also thinks there might be an infection because there was some drainage around the lip and then some crusting. No fevers no visual changes. Past Medical History:  Diagnosis Date  . Active smoker   . Anxiety   . Calcifying tendinitis of shoulder   . Chronic back pain   . Chronic pain syndrome   . COPD (chronic obstructive pulmonary disease) ([place])   . Dysthymic disorder   . Emphysema lung ([place])   . GERD (gastroesophageal reflux disease)   . Headache    "weekly maybe" (01/31/2016)  . Heart murmur    for years, nothing to be concerned about  . Herpes genitalia   . History of blood transfusion 1980   related to "back surgery"  . Hyperlipidemia   . Hypertension   . Hypothyroidism   . Lumbago   . Osteoarthrosis, unspecified whether generalized or localized, lower leg   . Pain in joint, upper arm   . Pneumothorax, left 01/31/2016   S/P Left posterior subcostal pain injection on 01/30/2016  . PONV (postoperative nausea and vomiting)    gets nauseous  with longer surgery. Difficuty voiding after surgery  . Postlaminectomy syndrome, thoracic region   . Primary localized osteoarthrosis, lower leg   . Restless leg syndrome   . Sleep apnea    s/p surgery- last sleep study 2011- doesnt use oxygen or machine at night as instructed,.   12/2014- Dr Halford Chessman  reports it is negative.  . Thyroid disease     Patient Active  Problem List   Diagnosis Date Noted  . Chronic respiratory failure ([place]) 07/29/2016  . Hypersomnia 07/29/2016  . Arthritis of carpometacarpal [facility]) joint of left thumb 07/09/2016  . Lump of axilla 05/02/2016  . Pneumothorax on left 01/31/2016  . Chronic, continuous use of opioids 09/06/2015  . Degenerative disc disease, lumbar 08/01/2015    Class: Chronic  . Spondylolisthesis of lumbar region 08/01/2015    Class: Chronic  . Skin ulcer ([place]) 04/05/2015  . Urinary, incontinence, stress female 05/06/2014  . Constipation 05/05/2014  . 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 HTN (hypertension) 05/19/2013  . HLD (hyperlipidemia) 05/19/2013  . Depression 05/19/2013  . Hypothyroidism 05/19/2013  . Healthcare maintenance 05/19/2013  . Tobacco abuse   . Osteoarthritis of both knees 11/18/2011    Past Surgical History:  Procedure Laterality Date  . APPENDECTOMY    . BACK SURGERY     18 back surgeries (2 thoracic & 16 lumbar) (01/31/2016)  . DILATION AND CURETTAGE OF UTERUS    . HAMMER TOE SURGERY    . JOINT REPLACEMENT    . KNEE ARTHROSCOPY Right   . LAPAROSCOPIC CHOLECYSTECTOMY    . LUMBAR FUSION N/A 08/01/2015   Procedure: Right sided L1-2 and L2-3 transforaminal lumbar interbody fusion with cages, Extension of posterior fusion T12 to L3, Replaced pedicle  screws bilaterally L1-L2 , Replaced left sided pedicle screws L-3. Instrumentation T12 to L3 using local bone graft, Vivigen allograft and cancellous chips;  Surgeon: [name], MD;  Location: [place] [facility];  Service: Orthopedics;  Laterality: N/A;  . LUMBAR LAMINECTOMY/DECOMPRESSION MICRODISCECTOMY N/A 01/28/2014   Procedure: Minimally Invasive Right  L1-2 Microdiscectomy;  Surgeon: [name], MD;  Location: [place];  Service: Orthopedics;  Laterality: N/A;  . TONSILLECTOMY    . TOTAL HIP ARTHROPLASTY Right   . TOTAL KNEE ARTHROPLASTY  05/29/2012   Procedure: TOTAL KNEE ARTHROPLASTY;  Surgeon: Mcarthur Rossetti, MD;  Location: [facility];   Service: Orthopedics;  Laterality: Right;  Right Total Knee Arthroplasty  . TUBAL LIGATION    . UVULOPALATOPHARYNGOPLASTY    . VAGINAL HYSTERECTOMY      OB History    No data available       Home Medications      Family History Family History  Problem Relation Age of Onset  . Kidney disease Mother   . Heart disease Father   . Anuerysm Brother 29    brain  . Heart disease Brother   . Heart disease Sister 39    s/p CABG  . Hypertension Sister     Social History Social History  Substance Use Topics  . Smoking status: Current Every Day Smoker    Packs/day: 1.25    Years: 45.00    Types: Cigarettes  . Smokeless tobacco: Never Used  . Alcohol use No     Allergies   Flagyl [metronidazole]; Sulfa antibiotics; Amoxicillin; Chlorzoxazone; Codeine; Darvocet [propoxyphene n-acetaminophen]; Dilaudid [hydromorphone hcl]; Keflex [cephalexin]; Morphine and related; Nitrofurantoin monohyd macro; and Percocet [oxycodone-acetaminophen]   Review of Systems Review of Systems 10 Systems reviewed and are negative for acute change except as noted in the HPI.  Physical Exam Updated Vital Signs BP 131/88 (BP Location: Left Arm)   Pulse 87   Temp 97.8 F (36.6 C) (Oral)   Resp 16   SpO2 94%   Physical Exam  Constitutional: She is oriented to person, place, and time. She appears well-developed and well-nourished. No distress.  HENT:  Head: Normocephalic and atraumatic.  Right Ear: External ear normal.  Left Ear: External ear normal.  Nose: Nose normal.  Mouth/Throat: Oropharynx is clear and moist.  Bilateral TMs normal. The patient has lesion on the left upper lip that has small constellation of vesicles and mild diffuse swelling of the left upper lip. Patient versus tenderness to palpation along the lip and the cheek. There are no lesions on the nose or other facial surfaces. No swelling or erythema around the eye.  Eyes: EOM are normal. Pupils are equal, round, and reactive  to light.  Neck: Neck supple.  No lymphadenopathy or meningismus.  Cardiovascular: Normal rate, regular rhythm, normal heart sounds and intact distal pulses.   Pulmonary/Chest: Effort normal and breath sounds normal.  Musculoskeletal: Normal range of motion.  Neurological: She is alert and oriented to person, place, and time. No cranial nerve deficit. She exhibits normal muscle tone. Coordination normal.  Skin: Skin is warm and dry.  Psychiatric: She has a normal mood and affect.  Patient has a slight drowsiness. This appears consistent with oversedation. Patient awakens to light voice. She stays awake during the exam. She however can drip off to sleep easily but requests pain medications.     ED Treatments / Results  Labs (all labs ordered are listed, but only abnormal results are displayed) Labs Reviewed - No  data to display  EKG  EKG Interpretation None       Radiology No results found.  Procedures Procedures (including critical care time)  Medications Ordered in ED Medications - No data to display   Initial Impression / Assessment and Plan / ED Course  I have reviewed the triage vital signs and the nursing notes.  Pertinent labs & imaging results that were available during my care of the patient were reviewed by me and considered in my medical decision making (see chart for details).  Clinical Course      Final Clinical Impressions(s) / ED Diagnoses   Final diagnoses:  Herpes labialis  Facial pain   Findings are consistent with herpes labialis. Patient howerver reports facial pain on the left side of her head. No lesions or visible about the eye nose or ears. Patient will be treated with Valtrex 1 g 3 times a day. She takes a suppressive dose of 500 mg daily of Valtrex (for genital herpes).  Patient does wear fentanyl patch and has by mouth Vicodin to take. Part of the history also included the patient having fallen asleep some protracted period of time with a  cup pressed to her lip. I feel the patient is exhibiting signs of oversedation from narcotic pain medication. The patient denies this, reporting  that she is not taking her Vicodin and only has on her fentanyl patch. She states she's been taking plain Tylenol. During the exam, patient made several requests for pain medication. Patient's daughter is present with her. I have counseled them on the risks of overuse of narcotic pain medications and oversedation. Patient does not show signs of being encephalopathic or having focal neurologic dysfunction.  New Prescriptions New Prescriptions   PREDNISONE (DELTASONE) 20 MG TABLET    2 tabs po daily x 3 days   VALACYCLOVIR (VALTREX) 1000 MG TABLET    Take 1 tablet (1,000 mg total) by mouth 3 (three) times daily.     Charlesetta Shanks, MD 08/04/16 1235

## 2016-08-04 NOTE — ED Triage Notes (Signed)
Patient c/o left lip swelling and facial pain and headache since yesterday morning.  Patient states that feels a spot on lip has drained during the night because woke up with "crusty stuff on mouth"

## 2016-08-05 ENCOUNTER — Other Ambulatory Visit: Payer: Self-pay | Admitting: Registered Nurse

## 2016-08-07 ENCOUNTER — Ambulatory Visit (INDEPENDENT_AMBULATORY_CARE_PROVIDER_SITE_OTHER): Payer: Medicare Other | Admitting: Internal Medicine

## 2016-08-07 ENCOUNTER — Encounter: Payer: Self-pay | Admitting: Internal Medicine

## 2016-08-07 VITALS — BP 134/80 | HR 95 | Temp 98.3°F | Ht 63.0 in | Wt 160.0 lb

## 2016-08-07 DIAGNOSIS — E038 Other specified hypothyroidism: Secondary | ICD-10-CM | POA: Diagnosis not present

## 2016-08-07 DIAGNOSIS — F329 Major depressive disorder, single episode, unspecified: Secondary | ICD-10-CM

## 2016-08-07 DIAGNOSIS — I10 Essential (primary) hypertension: Secondary | ICD-10-CM | POA: Diagnosis not present

## 2016-08-07 DIAGNOSIS — K219 Gastro-esophageal reflux disease without esophagitis: Secondary | ICD-10-CM | POA: Insufficient documentation

## 2016-08-07 DIAGNOSIS — N183 Chronic kidney disease, stage 3 unspecified: Secondary | ICD-10-CM

## 2016-08-07 DIAGNOSIS — E7800 Pure hypercholesterolemia, unspecified: Secondary | ICD-10-CM | POA: Diagnosis not present

## 2016-08-07 DIAGNOSIS — L98499 Non-pressure chronic ulcer of skin of other sites with unspecified severity: Secondary | ICD-10-CM

## 2016-08-07 DIAGNOSIS — Z720 Tobacco use: Secondary | ICD-10-CM

## 2016-08-07 DIAGNOSIS — M4316 Spondylolisthesis, lumbar region: Secondary | ICD-10-CM

## 2016-08-07 DIAGNOSIS — G2581 Restless legs syndrome: Secondary | ICD-10-CM

## 2016-08-07 DIAGNOSIS — K5901 Slow transit constipation: Secondary | ICD-10-CM

## 2016-08-07 DIAGNOSIS — N1832 Chronic kidney disease, stage 3b: Secondary | ICD-10-CM | POA: Insufficient documentation

## 2016-08-07 DIAGNOSIS — F32A Depression, unspecified: Secondary | ICD-10-CM

## 2016-08-07 DIAGNOSIS — R7303 Prediabetes: Secondary | ICD-10-CM

## 2016-08-07 MED ORDER — HYDROCHLOROTHIAZIDE 12.5 MG PO TABS: 12.5000 mg | ORAL_TABLET | Freq: Every day | ORAL | 1 refills | Status: DC

## 2016-08-07 MED ORDER — BENAZEPRIL HCL 40 MG PO TABS: 40.0000 mg | ORAL_TABLET | Freq: Every day | ORAL | 1 refills | Status: DC

## 2016-08-07 NOTE — Assessment & Plan Note (Signed)
Discussed smoking cessation She is currently using an e-cigarette , which has decreased the number of cigarettes she smokes

## 2016-08-07 NOTE — Progress Notes (Signed)
Subjective:    Patient ID: Jill Phillips, female    DOB: Jul 30, 1956, 60 y.o.   MRN: [id number]  HPI She is here to establish with a new pcp.    She follows with Dr Halford Chessman, Dr Naaman Plummer, Dr [name] - orthopedics.  Herpes labialis:  She went to the ED 12/3 for a lip lesion and was diagnosed with herpes.  She had pain in her face up to her eye and her head. She was placed on prednisone , higher does of valtrex and has been taking gabapentin.  The gabapentin is helping the pain. She denies any prior episodes of cold sores.  Back pain:  She has had 18 back surgeries.  She thinks she may need another one.  She follows with Orthopedics and Dr Naaman Plummer.  She is taking her pain medication as prescribed.    Chronic respiratory failure:  She was on oxygen at night, but is not longer using it.  Following with pulmonary and checking oxygen level in the morning to see if she needs to go back on oxygen.  She is working on quitting smoking.  She is using the e-cigarette to quit.    Hypertension: She is taking her medication daily. She is compliant with a low sodium diet.  She denies chest pain, palpitations, edema, shortness of breath and regular headaches. She is not exercising regularly.  She does not monitor her blood pressure at home.    Hypothyroidism:  She is taking her medication daily.  She denies any recent changes in energy or weight that are unexplained.   Hyperlipidemia: She is taking her medication daily. She is compliant with a low fat/cholesterol diet. She is exercising regularly. She denies myalgias.   GERD:  She is taking her medication daily as prescribed.  She denies any GERD symptoms and feels her GERD is well controlled.    Medications and allergies reviewed with patient and updated if appropriate.  Patient Active Problem List   Diagnosis Date Noted  . CKD (chronic kidney disease) stage 3, GFR 30-59 ml/min 08/07/2016  . Chronic respiratory failure ([place]) 07/29/2016  . Hypersomnia  07/29/2016  . Arthritis of carpometacarpal [facility]) joint of left thumb 07/09/2016  . Lump of axilla 05/02/2016  . Pneumothorax on left 01/31/2016  . Chronic, continuous use of opioids 09/06/2015  . Degenerative disc disease, lumbar 08/01/2015    Class: Chronic  . Spondylolisthesis of lumbar region 08/01/2015    Class: Chronic  . Skin ulcer ([place]) 04/05/2015  . Urinary, incontinence, stress female 05/06/2014  . Constipation 05/05/2014  . 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 HTN (hypertension) 05/19/2013  . HLD (hyperlipidemia) 05/19/2013  . Depression 05/19/2013  . Hypothyroidism 05/19/2013  . Tobacco abuse   . Osteoarthritis of both knees 11/18/2011    Current Outpatient Prescriptions on File Prior to Visit  Medication Sig Dispense Refill  . acetaminophen (TYLENOL) 500 MG tablet Take 1,000 mg by mouth every 6 (six) hours as needed for headache.     [name] amLODipine (NORVASC) 10 MG tablet Take 1 tablet (10 mg total) by mouth daily. 30 tablet 3  . benazepril (LOTENSIN) 40 MG tablet TAKE 1 TABLET BY MOUTH DAILY. 30 tablet 11  . clonazePAM (KLONOPIN) 2 MG tablet TAKE 1 TABLET BY MOUTH 2 TIMES A DAY AS NEEDED. 60 tablet 2  . fentaNYL (DURAGESIC - DOSED MCG/HR) 50 MCG/HR Place 1 patch (50 mcg total) onto the skin every 3 (three) days. 10 patch 0  . fluticasone (FLONASE) 50 MCG/ACT  nasal spray Place 1 spray into both nostrils daily. (Patient taking differently: Place 1 spray into both nostrils daily as needed for allergies. ) 16 g 5  . gabapentin (NEURONTIN) 300 MG capsule TAKE 1 CAPSULE BY MOUTH 2 TIMES DAILY. 60 capsule 4  . hydrochlorothiazide (HYDRODIURIL) 12.5 MG tablet TAKE 1 TABLET BY MOUTH DAILY 90 tablet 3  . HYDROcodone-acetaminophen (NORCO) 10-325 MG tablet Take 1 tablet by mouth every 6 (six) hours as needed (breakthrough pain). 120 tablet 0  . levothyroxine (SYNTHROID, LEVOTHROID) 75 MCG tablet Take 1 tablet (75 mcg total) by mouth daily before breakfast. 30 tablet 11  . naproxen (NAPROSYN)  500 MG tablet Take 500 mg by mouth 2 (two) times daily as needed for moderate pain.   4  . nicotine (NICODERM CQ - DOSED IN MG/24 HOURS) 21 mg/24hr patch Place 1 patch (21 mg total) onto the skin daily. 28 patch 0  . omeprazole (PRILOSEC) 20 MG capsule TAKE 1 CAPSULE BY MOUTH 2 TIMES DAILY BEFORE A MEAL. 60 capsule 11  . pravastatin (PRAVACHOL) 40 MG tablet TAKE 1 TABLET BY MOUTH DAILY. 90 tablet PRN  . predniSONE (DELTASONE) 20 MG tablet 2 tabs po daily x 3 days 6 tablet 0  . PROAIR HFA 108 (90 Base) MCG/ACT inhaler INHALE 1 PUFF INTO THE LUNGS EVERY 6 HOURS AS NEEDED FOR WHEEZING OR SHORTNESS OF BREATH. 8.5 g 0  . Psyllium (VEGETABLE LAXATIVE PO) Take 2 tablets by mouth at bedtime. Reported on 10/24/2015    . QUEtiapine (SEROQUEL) 100 MG tablet Take 1 tablet (100 mg total) by mouth at bedtime. 30 tablet 2  . rOPINIRole (REQUIP) 0.5 MG tablet TAKE 1 TABLET BY MOUTH AT BEDTIME FOR 1 WEEK THEN INCREASE TO 2 TABLETS AT BEDTIME. (STOP PRIMIDONE) (Patient taking differently: Take 0.5 mg by mouth at bedtime as needed. Restless legs) 60 tablet 2  . SPIRIVA HANDIHALER 18 MCG inhalation capsule PLACE 1 CAPSULE INTO INHALER AND INHALE DAILY. 30 capsule 1  . tiZANidine (ZANAFLEX) 4 MG tablet TAKE 1 TABLET BY MOUTH EVERY 8 HOURS AS NEEDED FOR MUSCLE PAIN/ SPASMS 90 tablet 3  . valACYclovir (VALTREX) 1000 MG tablet Take 1 tablet (1,000 mg total) by mouth 3 (three) times daily. 21 tablet 0  . valACYclovir (VALTREX) 500 MG tablet TAKE 1 TABLET BY MOUTH DAILY. 30 tablet 11  . venlafaxine XR (EFFEXOR-XR) 150 MG 24 hr capsule TAKE 2 CAPSULES (300 MG TOTAL) BY MOUTH DAILY WITH BREAKFAST. (Patient taking differently: take 300mg  at bedtime) 60 capsule 11  . VOLTAREN 1 % GEL APPLY 2 GRAMS TO AFFECTED AREA 3 TIMES A DAY AS NEEDED FOR PAIN. 300 g 4   No current facility-administered medications on file prior to visit.     Past Medical History:  Diagnosis Date  . Active smoker   . Anxiety   . Calcifying tendinitis of  shoulder   . Chronic back pain   . Chronic pain syndrome   . COPD (chronic obstructive pulmonary disease) ([place])   . Dysthymic disorder   . Emphysema lung ([place])   . GERD (gastroesophageal reflux disease)   . Headache    "weekly maybe" (01/31/2016)  . Heart murmur    for years, nothing to be concerned about  . Herpes genitalia   . History of blood transfusion 1980   related to "back surgery"  . Hyperlipidemia   . Hypertension   . Hypothyroidism   . Lumbago   . Osteoarthrosis, unspecified whether generalized or localized, lower leg   .  Pain in joint, upper arm   . Pneumothorax, left 01/31/2016   S/P Left posterior subcostal pain injection on 01/30/2016  . PONV (postoperative nausea and vomiting)    gets nauseous  with longer surgery. Difficuty voiding after surgery  . Postlaminectomy syndrome, thoracic region   . Primary localized osteoarthrosis, lower leg   . Restless leg syndrome   . Sleep apnea    s/p surgery- last sleep study 2011- doesnt use oxygen or machine at night as instructed,.   12/2014- Dr Halford Chessman  reports it is negative.  . Thyroid disease     Past Surgical History:  Procedure Laterality Date  . APPENDECTOMY    . BACK SURGERY     18 back surgeries (2 thoracic & 16 lumbar) (01/31/2016)  . DILATION AND CURETTAGE OF UTERUS    . HAMMER TOE SURGERY    . JOINT REPLACEMENT    . KNEE ARTHROSCOPY Right   . LAPAROSCOPIC CHOLECYSTECTOMY    . LUMBAR FUSION N/A 08/01/2015   Procedure: Right sided L1-2 and L2-3 transforaminal lumbar interbody fusion with cages, Extension of posterior fusion T12 to L3, Replaced pedicle screws bilaterally L1-L2 , Replaced left sided pedicle screws L-3. Instrumentation T12 to L3 using local bone graft, Vivigen allograft and cancellous chips;  Surgeon: [name], MD;  Location: [facility];  Service: Orthopedics;  Laterality: N/A;  . LUMBAR LAMINECTOMY/DECOMPRESSION MICRODISCECTOMY N/A 01/28/2014   Procedure: Minimally Invasive Right  L1-2 Microdiscectomy;   Surgeon: [name], MD;  Location: [facility];  Service: Orthopedics;  Laterality: N/A;  . TONSILLECTOMY    . TOTAL HIP ARTHROPLASTY Right   . TOTAL KNEE ARTHROPLASTY  05/29/2012   Procedure: TOTAL KNEE ARTHROPLASTY;  Surgeon: Mcarthur Rossetti, MD;  Location: [facility];  Service: Orthopedics;  Laterality: Right;  Right Total Knee Arthroplasty  . TUBAL LIGATION    . UVULOPALATOPHARYNGOPLASTY    . VAGINAL HYSTERECTOMY      Social History   Social History  . Marital status: Divorced    Spouse name: n/a  . Number of children: 2  . Years of education: 12+   Occupational History  . disability     back surgeries   Social History Main Topics  . Smoking status: Current Every Day Smoker    Packs/day: 1.25    Years: 45.00    Types: Cigarettes  . Smokeless tobacco: Never Used  . Alcohol use No  . Drug use:     Types: Marijuana     Comment: 01/31/2016 "none since ~ 1980"  . Sexual activity: No   Other Topics Concern  . None   Social History Narrative   Lives alone.  One daughter is local, but is getting ready to move to [place], where her children live with their father.  The other daughter lives near [place], [place].    Family History  Problem Relation Age of Onset  . Kidney disease Mother   . Heart disease Father   . Anuerysm Brother 29    brain  . Heart disease Brother   . Heart disease Sister 74    s/p CABG  . Hypertension Sister     Review of Systems  Constitutional: Negative for chills and fever.  HENT: Positive for mouth sores (upper lip).   Eyes: Negative for visual disturbance.  Respiratory: Negative for cough, shortness of breath and wheezing.   Cardiovascular: Negative for chest pain, palpitations and leg swelling.  Gastrointestinal: Positive for constipation. Negative for abdominal pain, blood in stool and  diarrhea.  Genitourinary: Negative for dysuria and hematuria.  Musculoskeletal: Positive for arthralgias, back pain and myalgias.  Neurological:  Positive for numbness. Negative for dizziness, light-headedness and headaches.  Psychiatric/Behavioral: Positive for dysphoric mood. The patient is nervous/anxious.        Objective:   Vitals:   08/07/16 0954  BP: 134/80  Pulse: 95  Temp: 98.3 F (36.8 C)   Filed Weights   08/07/16 0954  Weight: 160 lb (72.6 kg)   Body mass index is 28.34 kg/m.   Physical Exam Constitutional: She appears well-developed and well-nourished. No distress.  HENT:  Head: Normocephalic and atraumatic.  Right Ear: External ear normal. Normal ear canal and TM Left Ear: External ear normal.  Normal ear canal and TM - no lesions on TM Mouth/Throat: Oropharynx is clear and moist.  Eyes: Conjunctivae  normal.  Neck: Neck supple. No tracheal deviation present. No thyromegaly present.  No carotid bruit  Cardiovascular: Normal rate, regular rhythm and normal heart sounds.   1/6 systolic murmur heard.  No edema. Pulmonary/Chest: Effort normal and breath sounds normal. No respiratory distress. She has no wheezes. She has no rales.  Abdominal: Soft. She exhibits no distension. There is no tenderness.  Lymphadenopathy: She has no cervical adenopathy.  Skin: Skin is warm and dry. She is not diaphoretic.  Psychiatric: She has a normal mood and affect. Her behavior is normal.         Assessment & Plan:   See Problem List for Assessment and Plan of chronic medical problems.   F/u in 6 months

## 2016-08-07 NOTE — Assessment & Plan Note (Signed)
BP well controlled Current regimen effective and well tolerated Continue current medications at current doses  

## 2016-08-07 NOTE — Assessment & Plan Note (Signed)
Monitor A1c Encouraged low sugar/carbohydrate diet, regular exercise and weight loss

## 2016-08-07 NOTE — Assessment & Plan Note (Signed)
Taking Effexor-prescribed by Dr. Jenel Lucks her depression is well controlled

## 2016-08-07 NOTE — Patient Instructions (Addendum)
No immunizations administered today.   Medications reviewed and updated.  No changes recommended at this time.  Your prescription(s) have been submitted to your pharmacy. Please take as directed and contact our office if you believe you are having problem(s) with the medication(s).   Please followup in 6 months    Shingles Shingles, which is also known as herpes zoster, is an infection that causes a painful skin rash and fluid-filled blisters. Shingles is not related to genital herpes, which is a sexually transmitted infection. Shingles only develops in people who:  Have had chickenpox.  Have received the chickenpox vaccine. (This is rare.) What are the causes? Shingles is caused by varicella-zoster virus (VZV). This is the same virus that causes chickenpox. After exposure to VZV, the virus stays in the body in an inactive (dormant) state. Shingles develops if the virus reactivates. This can happen many years after the initial exposure to VZV. It is not known what causes this virus to reactivate. What increases the risk? People who have had chickenpox or received the chickenpox vaccine are at risk for shingles. Infection is more common in people who:  Are older than age 30.  Have a weakened defense (immune) system, such as those with HIV, AIDS, or cancer.  Are taking medicines that weaken the immune system, such as transplant medicines.  Are under great stress. What are the signs or symptoms? Early symptoms of this condition include itching, tingling, and pain in an area on your skin. Pain may be described as burning, stabbing, or throbbing. A few days or weeks after symptoms start, a painful red rash appears, usually on one side of the body in a bandlike or beltlike pattern. The rash eventually turns into fluid-filled blisters that break open, scab over, and dry up in about 2-3 weeks. At any time during the infection, you may also develop:  A fever.  Chills.  A  headache.  An upset stomach. How is this diagnosed? This condition is diagnosed with a skin exam. Sometimes, skin or fluid samples are taken from the blisters before a diagnosis is made. These samples are examined under a microscope or sent to a lab for testing. How is this treated? There is no specific cure for this condition. Your health care provider will probably prescribe medicines to help you manage pain, recover more quickly, and avoid long-term problems. Medicines may include:  Antiviral drugs.  Anti-inflammatory drugs.  Pain medicines. If the area involved is on your face, you may be referred to a specialist, such as an eye doctor (ophthalmologist) or an ear, nose, and throat (ENT) doctor to help you avoid eye problems, chronic pain, or disability. Follow these instructions at home: Medicines  Take medicines only as directed by your health care provider.  Apply an anti-itch or numbing cream to the affected area as directed by your health care provider. Blister and Rash Care  Take a cool bath or apply cool compresses to the area of the rash or blisters as directed by your health care provider. This may help with pain and itching.  Keep your rash covered with a loose bandage (dressing). Wear loose-fitting clothing to help ease the pain of material rubbing against the rash.  Keep your rash and blisters clean with mild soap and cool water or as directed by your health care provider.  Check your rash every day for signs of infection. These include redness, swelling, and pain that lasts or increases.  Do not pick your blisters.  Do not  scratch your rash. General instructions  Rest as directed by your health care provider.  Keep all follow-up visits as directed by your health care provider. This is important.  Until your blisters scab over, your infection can cause chickenpox in people who have never had it or been vaccinated against it. To prevent this from happening, avoid  contact with other people, especially:  Babies.  Pregnant women.  Children who have eczema.  Elderly people who have transplants.  People who have chronic illnesses, such as leukemia or AIDS. Contact a health care provider if:  Your pain is not relieved with prescribed medicines.  Your pain does not get better after the rash heals.  Your rash looks infected. Signs of infection include redness, swelling, and pain that lasts or increases. Get help right away if:  The rash is on your face or nose.  You have facial pain, pain around your eye area, or loss of feeling on one side of your face.  You have ear pain or you have ringing in your ear.  You have loss of taste.  Your condition gets worse. This information is not intended to replace advice given to you by your health care provider. Make sure you discuss any questions you have with your health care provider. Document Released: 08/19/2005 Document Revised: 04/14/2016 Document Reviewed: 06/30/2014 Elsevier Interactive Patient Education  2017 [employer].

## 2016-08-07 NOTE — Assessment & Plan Note (Signed)
Taking clonazepam - prescribed by Dr Naaman Plummer

## 2016-08-07 NOTE — Assessment & Plan Note (Signed)
GERD controlled Continue daily medication  

## 2016-08-07 NOTE — Assessment & Plan Note (Signed)
Kidney disease has been stable Recent blood work done [employer] at her next visit

## 2016-08-07 NOTE — Assessment & Plan Note (Addendum)
Has been stable on current dose Reviewed last TSH-we will recheck at her next visit

## 2016-08-07 NOTE — Progress Notes (Signed)
Pre visit review using our clinic review tool, if applicable. No additional management support is needed unless otherwise documented below in the visit note. 

## 2016-08-07 NOTE — Assessment & Plan Note (Signed)
Resolved

## 2016-08-07 NOTE — Assessment & Plan Note (Signed)
Lipid panel has not been checked in a while-we'll try to check at her next visit Continue on pravastatin

## 2016-08-07 NOTE — Assessment & Plan Note (Signed)
Increased stool softeners Can use MiraLAX as needed

## 2016-08-07 NOTE — Assessment & Plan Note (Signed)
Following with orthopedics and Dr. Tessa Lerner, who prescribes her pain medication

## 2016-08-08 ENCOUNTER — Encounter: Payer: Medicare Other | Attending: Physical Medicine & Rehabilitation | Admitting: Registered Nurse

## 2016-08-08 ENCOUNTER — Encounter: Payer: Self-pay | Admitting: Registered Nurse

## 2016-08-08 VITALS — BP 127/67 | HR 101

## 2016-08-08 DIAGNOSIS — Z9889 Other specified postprocedural states: Secondary | ICD-10-CM | POA: Insufficient documentation

## 2016-08-08 DIAGNOSIS — M5126 Other intervertebral disc displacement, lumbar region: Secondary | ICD-10-CM | POA: Insufficient documentation

## 2016-08-08 DIAGNOSIS — M170 Bilateral primary osteoarthritis of knee: Secondary | ICD-10-CM | POA: Insufficient documentation

## 2016-08-08 DIAGNOSIS — E079 Disorder of thyroid, unspecified: Secondary | ICD-10-CM | POA: Diagnosis not present

## 2016-08-08 DIAGNOSIS — G2581 Restless legs syndrome: Secondary | ICD-10-CM | POA: Insufficient documentation

## 2016-08-08 DIAGNOSIS — J449 Chronic obstructive pulmonary disease, unspecified: Secondary | ICD-10-CM | POA: Insufficient documentation

## 2016-08-08 DIAGNOSIS — I10 Essential (primary) hypertension: Secondary | ICD-10-CM | POA: Diagnosis not present

## 2016-08-08 DIAGNOSIS — Z9981 Dependence on supplemental oxygen: Secondary | ICD-10-CM | POA: Insufficient documentation

## 2016-08-08 DIAGNOSIS — M7918 Myalgia, other site: Secondary | ICD-10-CM

## 2016-08-08 DIAGNOSIS — Z5181 Encounter for therapeutic drug level monitoring: Secondary | ICD-10-CM | POA: Diagnosis not present

## 2016-08-08 DIAGNOSIS — E785 Hyperlipidemia, unspecified: Secondary | ICD-10-CM | POA: Diagnosis not present

## 2016-08-08 DIAGNOSIS — M2170 Unequal limb length (acquired), unspecified site: Secondary | ICD-10-CM | POA: Insufficient documentation

## 2016-08-08 DIAGNOSIS — M961 Postlaminectomy syndrome, not elsewhere classified: Secondary | ICD-10-CM | POA: Insufficient documentation

## 2016-08-08 DIAGNOSIS — M5136 Other intervertebral disc degeneration, lumbar region: Secondary | ICD-10-CM | POA: Diagnosis not present

## 2016-08-08 DIAGNOSIS — Z79899 Other long term (current) drug therapy: Secondary | ICD-10-CM | POA: Diagnosis not present

## 2016-08-08 DIAGNOSIS — K219 Gastro-esophageal reflux disease without esophagitis: Secondary | ICD-10-CM | POA: Diagnosis not present

## 2016-08-08 DIAGNOSIS — M51369 Other intervertebral disc degeneration, lumbar region without mention of lumbar back pain or lower extremity pain: Secondary | ICD-10-CM

## 2016-08-08 DIAGNOSIS — Z720 Tobacco use: Secondary | ICD-10-CM | POA: Diagnosis not present

## 2016-08-08 DIAGNOSIS — M791 Myalgia: Secondary | ICD-10-CM | POA: Diagnosis not present

## 2016-08-08 DIAGNOSIS — G9619 Other disorders of meninges, not elsewhere classified: Secondary | ICD-10-CM | POA: Diagnosis not present

## 2016-08-08 DIAGNOSIS — E039 Hypothyroidism, unspecified: Secondary | ICD-10-CM | POA: Diagnosis not present

## 2016-08-08 DIAGNOSIS — M4316 Spondylolisthesis, lumbar region: Secondary | ICD-10-CM

## 2016-08-08 DIAGNOSIS — G894 Chronic pain syndrome: Secondary | ICD-10-CM

## 2016-08-08 MED ORDER — CARISOPRODOL 350 MG PO TABS: 350.0000 mg | ORAL_TABLET | Freq: Three times a day (TID) | ORAL | 3 refills | Status: DC

## 2016-08-08 MED ORDER — GABAPENTIN 300 MG PO CAPS: 300.0000 mg | ORAL_CAPSULE | Freq: Three times a day (TID) | ORAL | 0 refills | Status: DC

## 2016-08-08 MED ORDER — HYDROCODONE-ACETAMINOPHEN 10-325 MG PO TABS: 1.0000 | ORAL_TABLET | Freq: Four times a day (QID) | ORAL | 0 refills | Status: DC | PRN

## 2016-08-08 MED ORDER — FENTANYL 50 MCG/HR TD PT72: 50.0000 ug | MEDICATED_PATCH | TRANSDERMAL | 0 refills | Status: DC

## 2016-08-08 NOTE — Progress Notes (Signed)
Subjective:    Patient ID: Jill Phillips, female    DOB: 08-16-1956, 60 y.o.   MRN: [id number]  HPI: [name] is a 60 year old female who returns for follow up for chronic pain and medication refill. She states her pain is located in her  lower back.She rates her pain 4. Her current exercise regime is walking and performing stretching exercises.  Also states she met a gentleman friend on line and has a date today, states " I'm so happy", educated on safety precautions. She verbalizes understanding. Daughter in room.  Pain Inventory Average Pain 6 Pain Right Now 4 My pain is intermittent, dull and aching  In the last 24 hours, has pain interfered with the following? General activity 10 Relation with others 0 Enjoyment of life 8 What TIME of day is your pain at its worst? evening Sleep (in general) Fair  Pain is worse with: walking, sitting and standing Pain improves with: rest and medication Relief from Meds: 8  Mobility walk without assistance ability to climb steps?  yes do you drive?  yes  Function disabled: date disabled 1999 I need assistance with the following:  meal prep, household duties and shopping  Neuro/Psych No problems in this area  Prior Studies Any changes since last visit?  no  Physicians involved in your care Any changes since last visit?  no   Family History  Problem Relation Age of Onset  . Kidney disease Mother   . Heart disease Father   . Anuerysm Brother 29    brain  . Heart disease Brother   . Heart disease Sister 63    s/p CABG  . Hypertension Sister    Social History   Social History  . Marital status: Divorced    Spouse name: n/a  . Number of children: 2  . Years of education: 12+   Occupational History  . disability     back surgeries   Social History Main Topics  . Smoking status: Current Every Day Smoker    Packs/day: 1.25    Years: 45.00    Types: Cigarettes  . Smokeless tobacco: Never Used  . Alcohol  use No  . Drug use:     Types: Marijuana     Comment: 01/31/2016 "none since ~ 1980"  . Sexual activity: No   Other Topics Concern  . Not on file   Social History Narrative   Lives alone.  One daughter is local, but is getting ready to move to [place], where her children live with their father.  The other daughter lives near [place], [place].   Past Surgical History:  Procedure Laterality Date  . APPENDECTOMY    . BACK SURGERY     18 back surgeries (2 thoracic & 16 lumbar) (01/31/2016)  . DILATION AND CURETTAGE OF UTERUS    . HAMMER TOE SURGERY    . JOINT REPLACEMENT    . KNEE ARTHROSCOPY Right   . LAPAROSCOPIC CHOLECYSTECTOMY    . LUMBAR FUSION N/A 08/01/2015   Procedure: Right sided L1-2 and L2-3 transforaminal lumbar interbody fusion with cages, Extension of posterior fusion T12 to L3, Replaced pedicle screws bilaterally L1-L2 , Replaced left sided pedicle screws L-3. Instrumentation T12 to L3 using local bone graft, Vivigen allograft and cancellous chips;  Surgeon: [name], MD;  Location: [facility];  Service: Orthopedics;  Laterality: N/A;  . LUMBAR LAMINECTOMY/DECOMPRESSION MICRODISCECTOMY N/A 01/28/2014   Procedure: Minimally Invasive Right  L1-2 Microdiscectomy;  Surgeon:  [name], MD;  Location: [facility];  Service: Orthopedics;  Laterality: N/A;  . TONSILLECTOMY    . TOTAL HIP ARTHROPLASTY Right   . TOTAL KNEE ARTHROPLASTY  05/29/2012   Procedure: TOTAL KNEE ARTHROPLASTY;  Surgeon: Mcarthur Rossetti, MD;  Location: [facility];  Service: Orthopedics;  Laterality: Right;  Right Total Knee Arthroplasty  . TUBAL LIGATION    . UVULOPALATOPHARYNGOPLASTY    . VAGINAL HYSTERECTOMY     Past Medical History:  Diagnosis Date  . Active smoker   . Anxiety   . Calcifying tendinitis of shoulder   . Chronic back pain   . Chronic pain syndrome   . COPD (chronic obstructive pulmonary disease) ([place])   . Dysthymic disorder   . Emphysema lung ([place])   . GERD (gastroesophageal reflux  disease)   . Headache    "weekly maybe" (01/31/2016)  . Heart murmur    for years, nothing to be concerned about  . Herpes genitalia   . History of blood transfusion 1980   related to "back surgery"  . Hyperlipidemia   . Hypertension   . Hypothyroidism   . Lumbago   . Osteoarthrosis, unspecified whether generalized or localized, lower leg   . Pain in joint, upper arm   . Pneumothorax, left 01/31/2016   S/P Left posterior subcostal pain injection on 01/30/2016  . PONV (postoperative nausea and vomiting)    gets nauseous  with longer surgery. Difficuty voiding after surgery  . Postlaminectomy syndrome, thoracic region   . Primary localized osteoarthrosis, lower leg   . Restless leg syndrome   . Sleep apnea    s/p surgery- last sleep study 2011- doesnt use oxygen or machine at night as instructed,.   12/2014- Dr Halford Chessman  reports it is negative.  . Thyroid disease    There were no vitals taken for this visit.  Opioid Risk Score:   Fall Risk Score:  `1  Depression screen PHQ 2/9  Depression screen [facility] 2/9 05/15/2016 05/02/2016 12/07/2015 11/15/2015 10/13/2015 09/06/2015 05/22/2015  Decreased Interest 0 0 0 0 0 0 0  Down, Depressed, Hopeless 0 0 0 0 - 0 0  PHQ - 2 Score 0 0 0 0 0 0 0  Altered sleeping - - - - - - -  Tired, decreased energy - - - - - - -  Change in appetite - - - - - - -  Feeling bad or failure about yourself  - - - - - - -  Trouble concentrating - - - - - - -  Moving slowly or fidgety/restless - - - - - - -  Suicidal thoughts - - - - - - -  PHQ-9 Score - - - - - - -  Some recent data might be hidden   Review of Systems  Constitutional: Negative.   HENT: Negative.   Eyes: Negative.   Respiratory: Negative.   Cardiovascular: Negative.   Gastrointestinal: Negative.   Endocrine: Negative.   Genitourinary: Negative.   Musculoskeletal: Positive for back pain.  Skin: Negative.   Allergic/Immunologic: Negative.   Neurological: Negative.   Hematological: Negative.     Psychiatric/Behavioral: Negative.   All other systems reviewed and are negative.      Objective:   Physical Exam  Constitutional: She is oriented to person, place, and time. She appears well-developed and well-nourished.  HENT:  Head: Normocephalic and atraumatic.  Neck: Normal range of motion. Neck supple.  Cardiovascular: Normal rate and regular rhythm.   Pulmonary/Chest:  Effort normal and breath sounds normal.  Musculoskeletal:  Normal Muscle Bulk and Muscle Testing Reveals: Upper Extremities: Full ROM and Muscle Strength 5/5 Lumbar Paraspinal Tenderness: L-3- L-5 Lower Extremities: Full ROM and Muscle Strength 5/5 Arises from table with ease Narrow Based Gait  Neurological: She is alert and oriented to person, place, and time.  Skin: Skin is warm and dry.  Psychiatric: She has a normal mood and affect.  Nursing note and vitals reviewed.         Assessment & Plan:  1. Chronic lumbar spine pain/post-lami syndrome: Refilled: Fentanyl 50 MCG one patch every three days #10 and Hydrocodone 10/325 mg one tablet every 6 hours as needed for pain #120. We will continue the opioid monitoring program, this consists of regular clinic visits, examinations, urine drug screen, pill counts as well as use of [place] Controlled Substance Reporting System. 2. Osteoarthritis of the knees bilaterally:No complaints today. Continue with Heat, exercise and voltaren gel.  3. Rotator cuff syndrome/subacromial bursitis: No complaints voiced today. 4. Restless legs syndrome: Continue Requip.Continue to monitor 5. Tobacco Abuse: Encourage Smoking Cessation: Pulmonologist Following 6. Emphysema: Pulmonology Following 7. Muscle Spasm: Continue Soma 8. Insomnia: Continue Seroquel   20 minutes of face to face patient care time was spent during this visit. All questions were encouraged and answered.   F/u in 1 month

## 2016-08-12 ENCOUNTER — Telehealth: Payer: Self-pay | Admitting: Internal Medicine

## 2016-08-12 NOTE — Telephone Encounter (Signed)
Please advise 

## 2016-08-12 NOTE — Telephone Encounter (Signed)
Patient states she spoke to Dr. Quay Burow about being constipated the last week.  Patient states it has been at least two weeks since last BM.  Patient states she has been using Murelax for 4 days, laxative everyday and a stool softner everyday b/c of her taking pain meds, and  enema yesterday.  Would like to know what else she can do.

## 2016-08-13 NOTE — Telephone Encounter (Signed)
She likely has constipation related to her pain medication.  Has she tried a medication specifically for opoid induced constipation?  If not we can try that.

## 2016-08-14 MED ORDER — NALOXEGOL OXALATE 25 MG PO TABS: 25.0000 mg | ORAL_TABLET | Freq: Every day | ORAL | 3 refills | Status: DC

## 2016-08-14 NOTE — Telephone Encounter (Signed)
Spoke with pt yesterday, she would like you to send in the medication for constipation to her pharmacy.

## 2016-08-14 NOTE — Telephone Encounter (Signed)
Medication sent to pharmacy - if too expensive we will need to try a different medication

## 2016-08-15 LAB — TOXASSURE SELECT,+ANTIDEPR,UR

## 2016-08-15 NOTE — Telephone Encounter (Signed)
Patient called back.  Gave MD response.  Patient states she was disappointed with the response time.  Patient states she felt her issue was very serious.  She feels like her stomach is very large and its hard to breath and she is also ill.  I told patient she needs to go to ED.  Patient then states she did start to have some bowel movements yesterday.

## 2016-08-16 ENCOUNTER — Other Ambulatory Visit: Payer: Self-pay | Admitting: Physical Medicine & Rehabilitation

## 2016-08-16 DIAGNOSIS — G4734 Idiopathic sleep related nonobstructive alveolar hypoventilation: Secondary | ICD-10-CM | POA: Diagnosis not present

## 2016-08-19 ENCOUNTER — Telehealth: Payer: Self-pay | Admitting: *Deleted

## 2016-08-19 NOTE — Telephone Encounter (Signed)
Only one course is recommended.   No repeat treatment is needed or advised.

## 2016-08-19 NOTE — Telephone Encounter (Signed)
Valtrex is the treatment for shingles.  There unfortunately is no other treatment

## 2016-08-19 NOTE — Telephone Encounter (Signed)
Left msg on triage stating she still have shingles on her lips, very painful, been using [facility]. When she was at the hosp they rx her Valtrex 1000 BID wanting to see if MD would refill to help it clear up...Johny Chess

## 2016-08-19 NOTE — Progress Notes (Signed)
Urine drug screen for this encounter is consistent for prescribed medication 

## 2016-08-19 NOTE — Telephone Encounter (Signed)
She is aware she is wanting to get another refill on the Valtrex 1000 mg.  Is that ok?

## 2016-08-20 ENCOUNTER — Other Ambulatory Visit: Payer: Self-pay | Admitting: *Deleted

## 2016-08-20 ENCOUNTER — Telehealth: Payer: Self-pay | Admitting: Pulmonary Disease

## 2016-08-20 DIAGNOSIS — J9611 Chronic respiratory failure with hypoxia: Secondary | ICD-10-CM

## 2016-08-20 MED ORDER — OMEPRAZOLE 20 MG PO CPDR: DELAYED_RELEASE_CAPSULE | ORAL | 5 refills | Status: DC

## 2016-08-20 NOTE — Telephone Encounter (Signed)
Faxed form back to [facility] w/MD response...Johny Chess

## 2016-08-20 NOTE — Telephone Encounter (Signed)
ONO results received and placed for Jill Phillips to review

## 2016-08-20 NOTE — Telephone Encounter (Signed)
Per TP: Pt did not desat after 10 PM. Can d/c oxygen. Pt notified of results.  Order placed to [facility] to d/c oxygen.

## 2016-08-20 NOTE — Telephone Encounter (Signed)
Called pt no answer left detail msg on vm w/MD response...Johny Chess

## 2016-08-20 NOTE — Telephone Encounter (Signed)
This has been prescribed by her pain management doctor.

## 2016-08-20 NOTE — Telephone Encounter (Signed)
Rec'd fax pt requesting refill on clonazepam 2 mg take 1 tab twice a day. Last filled 07/23/16...Johny Chess

## 2016-08-20 NOTE — Telephone Encounter (Signed)
Called and spoke to pt. Pt is requesting the results of ONO.   Denise please advise if you have received this. Thanks.

## 2016-08-21 ENCOUNTER — Other Ambulatory Visit (INDEPENDENT_AMBULATORY_CARE_PROVIDER_SITE_OTHER): Payer: Self-pay | Admitting: Orthopaedic Surgery

## 2016-08-21 ENCOUNTER — Other Ambulatory Visit: Payer: Self-pay | Admitting: Registered Nurse

## 2016-08-21 MED ORDER — CLONAZEPAM 2 MG PO TABS: ORAL_TABLET | ORAL | 2 refills | Status: DC

## 2016-08-29 ENCOUNTER — Telehealth: Payer: Self-pay | Admitting: Emergency Medicine

## 2016-08-29 MED ORDER — LUBIPROSTONE 24 MCG PO CAPS: 24.0000 ug | ORAL_CAPSULE | Freq: Two times a day (BID) | ORAL | 5 refills | Status: DC

## 2016-08-29 NOTE — Telephone Encounter (Signed)
amitza sent to [facility]

## 2016-08-29 NOTE — Telephone Encounter (Signed)
Received another fax from pharmacy stating that [facility] is preferred over [facility]. Is this something that is okay to send in?

## 2016-08-30 ENCOUNTER — Encounter: Payer: Self-pay | Admitting: Adult Health

## 2016-08-30 IMAGING — RF DG LUMBAR SPINE COMPLETE 4+V
1 series · 4 of 4 positions shown · non-contrast
Comparison: CT lumbar spine of 07/06/2014

CLINICAL DATA: Posterior lumbar spine fusion

EXAM:
LUMBAR SPINE - COMPLETE 4+ VIEW

[Series 1: run · 4 of 4 slices shown]
[im 1/4]
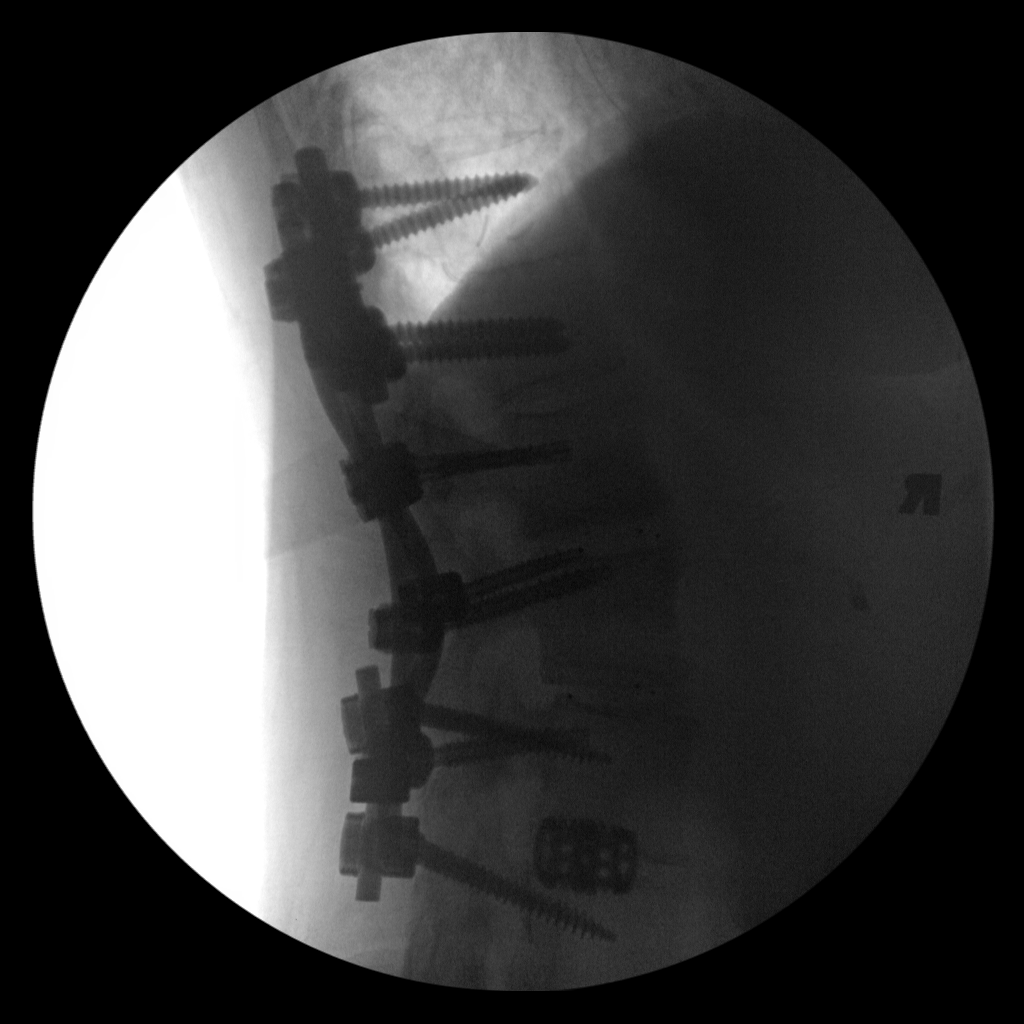
[im 2/4]
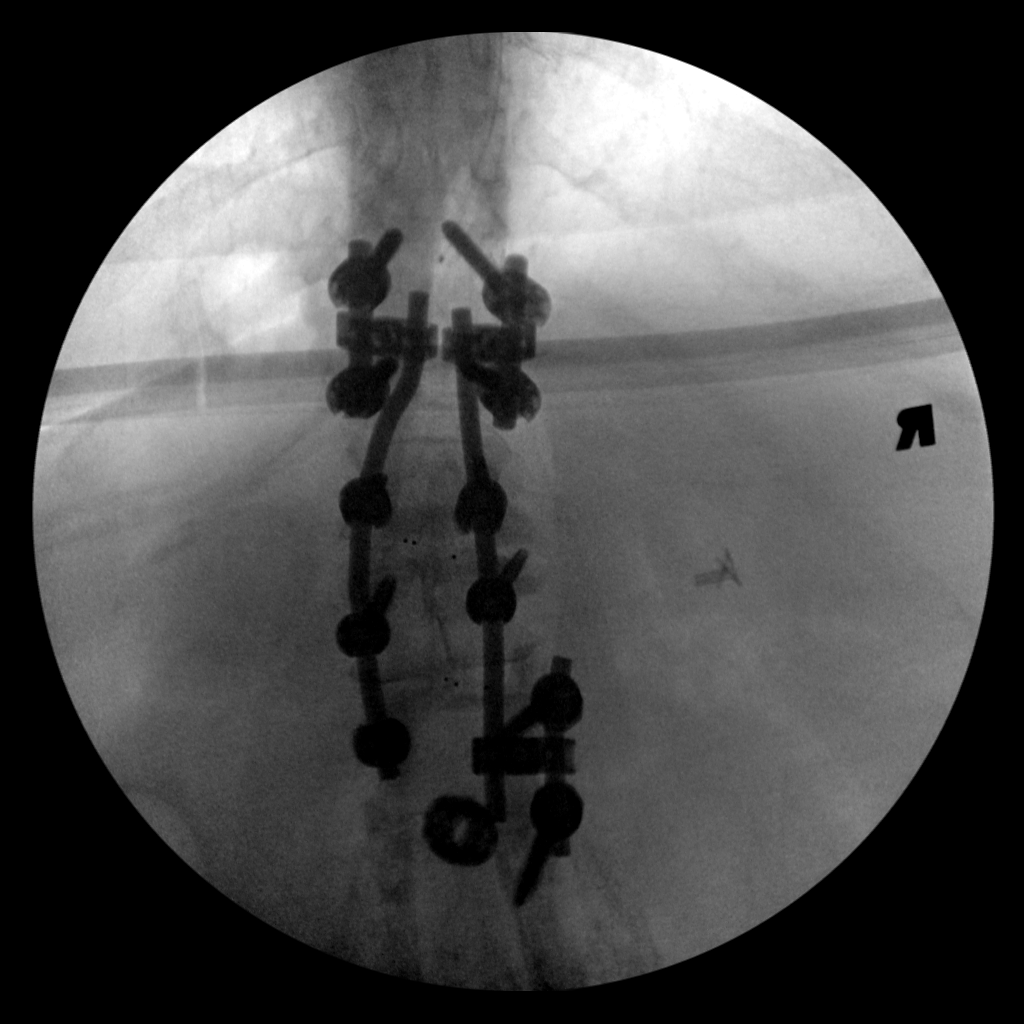
[im 3/4]
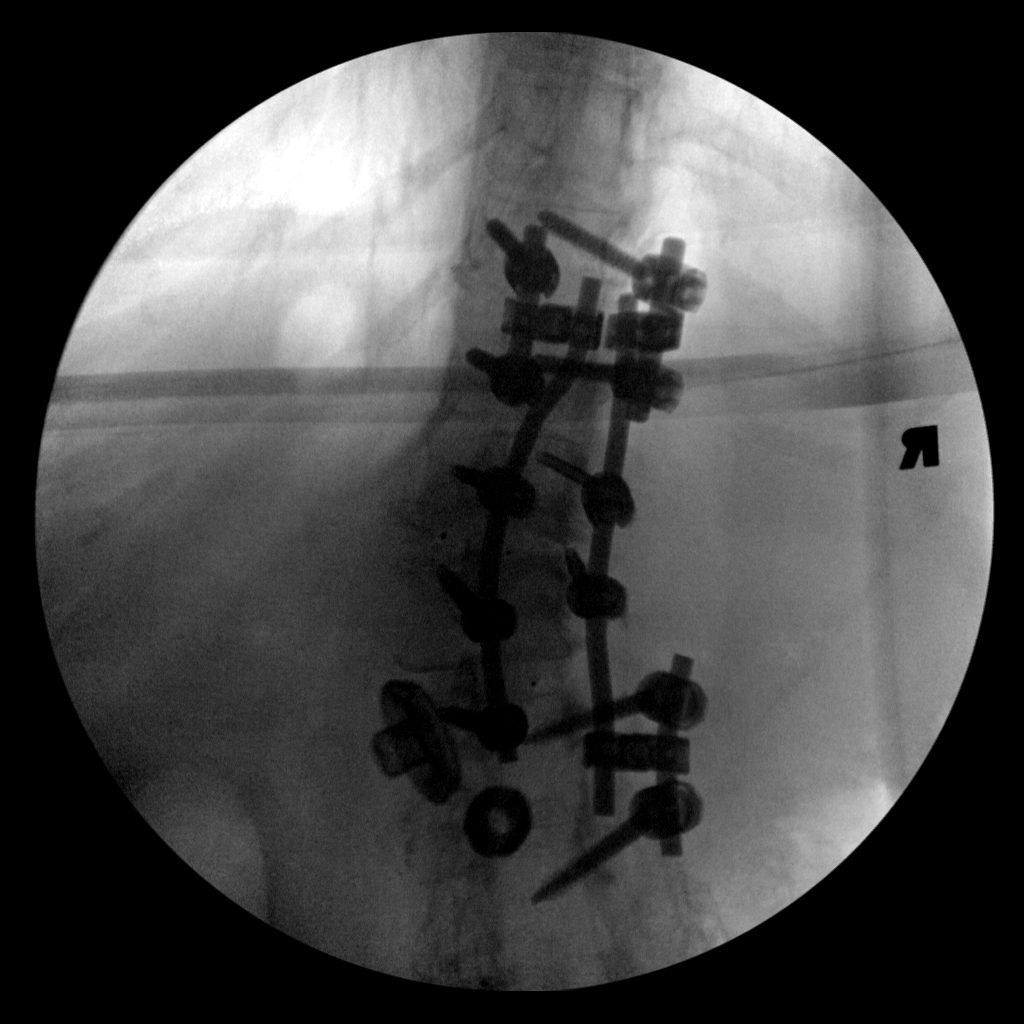
[im 4/4]
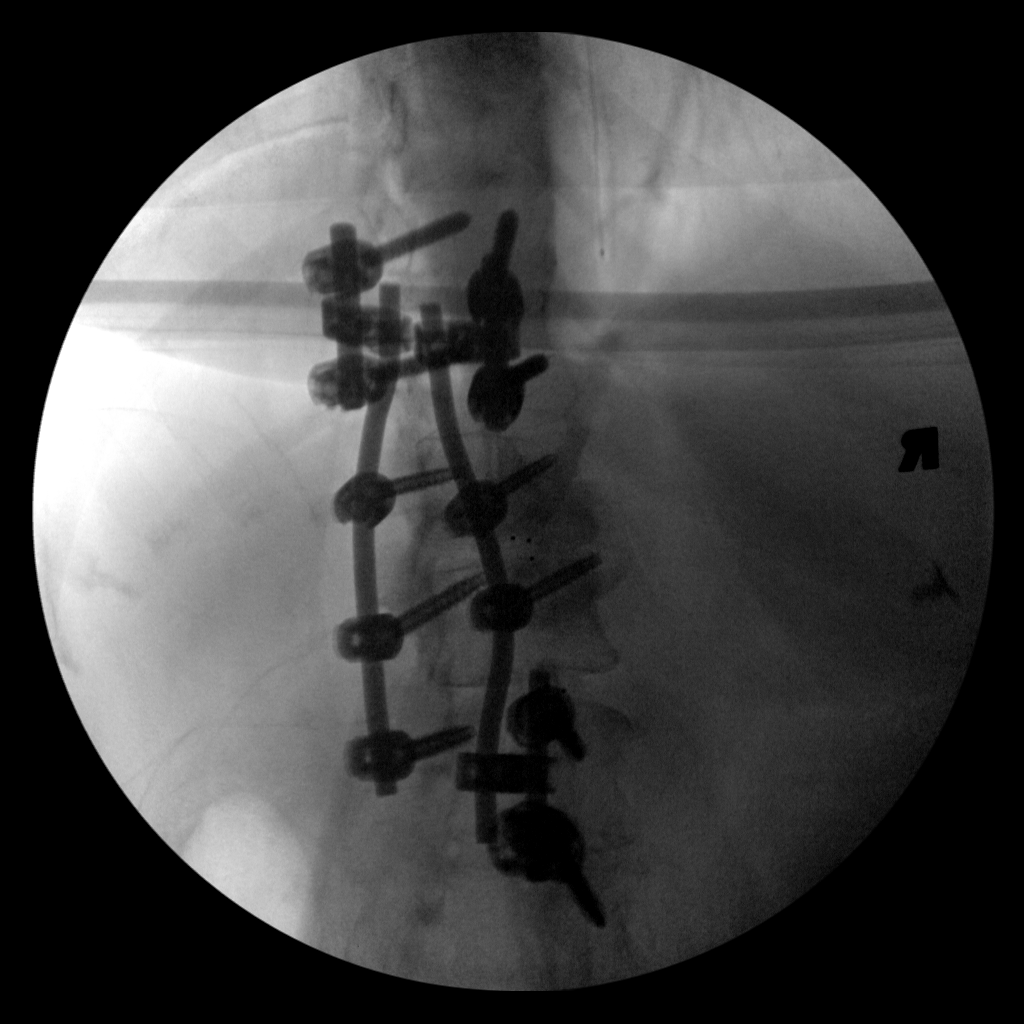

[4 of 4 positions shown; findings below may reference images not displayed]

FINDINGS: With limited field of view, evaluation of the exact level of
posterior fusion is difficult. However a Ray cage is present, when
compared to the prior lumbar spine CT, at the L3-4 level. Therefore
it appears that posterior hardware for fusion is now present from
T11 the L4. The screws at the L 2 level appear to be somewhat high
in position.
IMPRESSION: 1. Hardware for posterior fusion which appears to be from T11-L4 as
described above.
2. Somewhat high position questioned concerning the screws at L2
level.

## 2016-09-02 ENCOUNTER — Other Ambulatory Visit: Payer: Self-pay | Admitting: Internal Medicine

## 2016-09-03 ENCOUNTER — Other Ambulatory Visit: Payer: Self-pay | Admitting: Internal Medicine

## 2016-09-04 ENCOUNTER — Other Ambulatory Visit: Payer: Self-pay | Admitting: Pulmonary Disease

## 2016-09-05 ENCOUNTER — Encounter: Payer: Self-pay | Admitting: Nurse Practitioner

## 2016-09-05 ENCOUNTER — Ambulatory Visit (INDEPENDENT_AMBULATORY_CARE_PROVIDER_SITE_OTHER): Payer: Medicare HMO | Admitting: Nurse Practitioner

## 2016-09-05 VITALS — BP 138/78 | HR 109 | Temp 98.1°F | Ht 64.0 in | Wt 159.0 lb

## 2016-09-05 DIAGNOSIS — K6289 Other specified diseases of anus and rectum: Secondary | ICD-10-CM

## 2016-09-05 DIAGNOSIS — K5903 Drug induced constipation: Secondary | ICD-10-CM | POA: Diagnosis not present

## 2016-09-05 MED ORDER — ALBUTEROL SULFATE HFA 108 (90 BASE) MCG/ACT IN AERS: INHALATION_SPRAY | RESPIRATORY_TRACT | 0 refills | Status: DC

## 2016-09-05 MED ORDER — LUBIPROSTONE 24 MCG PO CAPS: 24.0000 ug | ORAL_CAPSULE | Freq: Two times a day (BID) | ORAL | 0 refills | Status: DC

## 2016-09-05 MED ORDER — HYDROCORTISONE ACETATE 25 MG RE SUPP: 25.0000 mg | Freq: Two times a day (BID) | RECTAL | 0 refills | Status: DC

## 2016-09-05 NOTE — Patient Instructions (Signed)

## 2016-09-05 NOTE — Progress Notes (Signed)
Pre visit review using our clinic review tool, if applicable. No additional management support is needed unless otherwise documented below in the visit note. 

## 2016-09-05 NOTE — Progress Notes (Signed)
Subjective:  Patient ID: Jill Phillips, female    DOB: 10/23/1955  Age: 61 y.o. MRN: [id number]  CC: Rectal Problems (burning all the time,pain when down,denied bleeding. )   HPI Presents with burning sensation in rectal region. Ongoing x 1week. No specific aggravating factor. Denies any ABD pain or rectal bleeding. Has chronic constipation, has not used amitiza or laxative. No nausea, no vomiting, no fever, no rectal discharge.  Medication list reviewed  ROS See HPI  Objective:  BP 138/78   Pulse (!) 109   Temp 98.1 F (36.7 C)   Ht 5\' 4"  (1.626 m)   Wt 159 lb (72.1 kg)   SpO2 98%   BMI 27.29 kg/m   BP Readings from Last 3 Encounters:  09/05/16 138/78  08/08/16 127/67  08/07/16 134/80    Wt Readings from Last 3 Encounters:  09/05/16 159 lb (72.1 kg)  08/07/16 160 lb (72.6 kg)  07/29/16 158 lb 3.2 oz (71.8 kg)    Physical Exam  Constitutional: She is oriented to person, place, and time.  Cardiovascular: Normal rate.   Pulmonary/Chest: Effort normal.  Abdominal: Soft. She exhibits no distension. There is no tenderness.  Genitourinary: Rectal exam shows no external hemorrhoid, no internal hemorrhoid, no fissure, no mass and no tenderness.  Genitourinary Comments: Rectal exam done with anoscope. Chaperone present  Neurological: She is alert and oriented to person, place, and time.    Lab Results  Component Value Date   WBC 7.6 06/07/2016   HGB 12.1 06/07/2016   HCT 38.1 06/07/2016   PLT 227 06/07/2016   GLUCOSE 70 06/07/2016   CHOL 244 (H) 05/05/2014   TRIG 464 (H) 05/05/2014   HDL 53 05/05/2014   LDLDIRECT 145 (H) 05/06/2014   LDLCALC NOT CALC 05/05/2014   ALT 30 06/07/2016   AST 44 (H) 06/07/2016   NA 143 06/07/2016   K 3.6 06/07/2016   CL 109 06/07/2016   CREATININE 1.19 (H) 06/07/2016   BUN 18 06/07/2016   CO2 29 06/07/2016   TSH 5.210 (H) 05/22/2015   INR 0.87 05/26/2012   HGBA1C 5.9 10/10/2013    No results found.  Assessment & Plan:     Jill Phillips was seen today for rectal problems.  Diagnoses and all orders for this visit:  Rectal irritation -     hydrocortisone (ANUSOL-HC) 25 MG suppository; Place 1 suppository (25 mg total) rectally 2 (two) times daily.  Drug-induced constipation  Other orders -     lubiprostone (AMITIZA) 24 MCG capsule; Take 1 capsule (24 mcg total) by mouth 2 (two) times daily with a meal. -     albuterol (PROAIR HFA) 108 (90 Base) MCG/ACT inhaler; INHALE 1 PUFF INTO THE LUNGS EVERY 6 HOURS AS NEEDED FOR WHEEZING OR SHORTNESS OF BREATH.NEED OFFICE VISIT FOR FURTHER REFILLS   I have changed Ms. Gledhill's PROAIR HFA to albuterol. I am also having her start on hydrocortisone. Additionally, I am having her maintain her acetaminophen, venlafaxine XR, Psyllium (VEGETABLE LAXATIVE PO), levothyroxine, amLODipine, fluticasone, pravastatin, rOPINIRole, nicotine, tiZANidine, valACYclovir, VOLTAREN, hydrochlorothiazide, benazepril, fentaNYL, HYDROcodone-acetaminophen, gabapentin, carisoprodol, rOPINIRole, omeprazole, naproxen, QUEtiapine, clonazePAM, SPIRIVA HANDIHALER, and lubiprostone.  Meds ordered this encounter  Medications  . hydrocortisone (ANUSOL-HC) 25 MG suppository    Sig: Place 1 suppository (25 mg total) rectally 2 (two) times daily.    Dispense:  12 suppository    Refill:  0    Order Specific Question:   Supervising Provider    Answer:   Lew Dawes  V [[id number]]  . lubiprostone (AMITIZA) 24 MCG capsule    Sig: Take 1 capsule (24 mcg total) by mouth 2 (two) times daily with a meal.    Dispense:  15 capsule    Refill:  0    Order Specific Question:   Supervising Provider    Answer:   Cassandria Anger [[id number]]  . albuterol (PROAIR HFA) 108 (90 Base) MCG/ACT inhaler    Sig: INHALE 1 PUFF INTO THE LUNGS EVERY 6 HOURS AS NEEDED FOR WHEEZING OR SHORTNESS OF BREATH.NEED OFFICE VISIT FOR FURTHER REFILLS    Dispense:  8.5 g    Refill:  0    NEEDS OVER FOR FURTHER REFILLS. THANKS.    Order Specific  Question:   Supervising Provider    Answer:   Cassandria Anger [[id number]]    Follow-up: Return if symptoms worsen or fail to improve.  Wilfred Lacy, NP

## 2016-09-06 ENCOUNTER — Encounter: Payer: Self-pay | Admitting: Registered Nurse

## 2016-09-06 ENCOUNTER — Encounter: Payer: Medicare HMO | Attending: Physical Medicine & Rehabilitation | Admitting: Registered Nurse

## 2016-09-06 VITALS — BP 113/77 | HR 96 | Resp 14

## 2016-09-06 DIAGNOSIS — M2170 Unequal limb length (acquired), unspecified site: Secondary | ICD-10-CM | POA: Insufficient documentation

## 2016-09-06 DIAGNOSIS — M170 Bilateral primary osteoarthritis of knee: Secondary | ICD-10-CM | POA: Diagnosis not present

## 2016-09-06 DIAGNOSIS — E785 Hyperlipidemia, unspecified: Secondary | ICD-10-CM | POA: Insufficient documentation

## 2016-09-06 DIAGNOSIS — M62830 Muscle spasm of back: Secondary | ICD-10-CM

## 2016-09-06 DIAGNOSIS — M791 Myalgia: Secondary | ICD-10-CM

## 2016-09-06 DIAGNOSIS — I10 Essential (primary) hypertension: Secondary | ICD-10-CM | POA: Diagnosis not present

## 2016-09-06 DIAGNOSIS — M546 Pain in thoracic spine: Secondary | ICD-10-CM

## 2016-09-06 DIAGNOSIS — M7918 Myalgia, other site: Secondary | ICD-10-CM

## 2016-09-06 DIAGNOSIS — M4316 Spondylolisthesis, lumbar region: Secondary | ICD-10-CM

## 2016-09-06 DIAGNOSIS — J449 Chronic obstructive pulmonary disease, unspecified: Secondary | ICD-10-CM | POA: Insufficient documentation

## 2016-09-06 DIAGNOSIS — Z9889 Other specified postprocedural states: Secondary | ICD-10-CM | POA: Diagnosis not present

## 2016-09-06 DIAGNOSIS — M5126 Other intervertebral disc displacement, lumbar region: Secondary | ICD-10-CM | POA: Insufficient documentation

## 2016-09-06 DIAGNOSIS — E079 Disorder of thyroid, unspecified: Secondary | ICD-10-CM | POA: Diagnosis not present

## 2016-09-06 DIAGNOSIS — M961 Postlaminectomy syndrome, not elsewhere classified: Secondary | ICD-10-CM | POA: Insufficient documentation

## 2016-09-06 DIAGNOSIS — Z5181 Encounter for therapeutic drug level monitoring: Secondary | ICD-10-CM | POA: Diagnosis not present

## 2016-09-06 DIAGNOSIS — M5136 Other intervertebral disc degeneration, lumbar region: Secondary | ICD-10-CM

## 2016-09-06 DIAGNOSIS — G9619 Other disorders of meninges, not elsewhere classified: Secondary | ICD-10-CM | POA: Diagnosis not present

## 2016-09-06 DIAGNOSIS — G894 Chronic pain syndrome: Secondary | ICD-10-CM | POA: Diagnosis not present

## 2016-09-06 DIAGNOSIS — Z79899 Other long term (current) drug therapy: Secondary | ICD-10-CM | POA: Diagnosis not present

## 2016-09-06 DIAGNOSIS — Z720 Tobacco use: Secondary | ICD-10-CM | POA: Diagnosis not present

## 2016-09-06 DIAGNOSIS — G2581 Restless legs syndrome: Secondary | ICD-10-CM | POA: Insufficient documentation

## 2016-09-06 DIAGNOSIS — M7061 Trochanteric bursitis, right hip: Secondary | ICD-10-CM

## 2016-09-06 DIAGNOSIS — K219 Gastro-esophageal reflux disease without esophagitis: Secondary | ICD-10-CM | POA: Diagnosis not present

## 2016-09-06 DIAGNOSIS — Z9981 Dependence on supplemental oxygen: Secondary | ICD-10-CM | POA: Diagnosis not present

## 2016-09-06 DIAGNOSIS — M7062 Trochanteric bursitis, left hip: Secondary | ICD-10-CM

## 2016-09-06 DIAGNOSIS — G8929 Other chronic pain: Secondary | ICD-10-CM

## 2016-09-06 DIAGNOSIS — E039 Hypothyroidism, unspecified: Secondary | ICD-10-CM | POA: Diagnosis not present

## 2016-09-06 MED ORDER — HYDROCODONE-ACETAMINOPHEN 10-325 MG PO TABS: 1.0000 | ORAL_TABLET | Freq: Four times a day (QID) | ORAL | 0 refills | Status: DC | PRN

## 2016-09-06 MED ORDER — FENTANYL 50 MCG/HR TD PT72: 50.0000 ug | MEDICATED_PATCH | TRANSDERMAL | 0 refills | Status: DC

## 2016-09-06 NOTE — Progress Notes (Signed)
Subjective:    Patient ID: Jill Phillips, female    DOB: 10-23-1955, 61 y.o.   MRN: [id number]  HPI: Jill Phillips is a 61 year old female who returns for follow up for chronic pain and medication refill. She states her pain is located in her upper- lower back and bilateral hips.She rates her pain 6. Her current exercise regime is walking and performing stretching exercises.   Pain Inventory Average Pain 5 Pain Right Now 6 My pain is dull and aching  In the last 24 hours, has pain interfered with the following? General activity 4 Relation with others 1 Enjoyment of life 1 What TIME of day is your pain at its worst? daytime, evening Sleep (in general) n/a  Pain is worse with: walking, standing and some activites Pain improves with: rest and medication Relief from Meds: 7  Mobility walk without assistance ability to climb steps?  yes do you drive?  yes Do you have any goals in this area?  no  Function Do you have any goals in this area?  no  Neuro/Psych numbness trouble walking spasms  Prior Studies Any changes since last visit?  no  Physicians involved in your care Any changes since last visit?  no   Family History  Problem Relation Age of Onset  . Kidney disease Mother   . Heart disease Father   . Anuerysm Brother 29    brain  . Heart disease Brother   . Heart disease Sister 38    s/p CABG  . Hypertension Sister    Social History   Social History  . Marital status: Divorced    Spouse name: n/a  . Number of children: 2  . Years of education: 12+   Occupational History  . disability     back surgeries   Social History Main Topics  . Smoking status: Current Every Day Smoker    Packs/day: 1.25    Years: 45.00    Types: Cigarettes  . Smokeless tobacco: Never Used  . Alcohol use No  . Drug use:     Types: Marijuana     Comment: 01/31/2016 "none since ~ 1980"  . Sexual activity: No   Other Topics Concern  . None   Social History  Narrative   Lives alone.  One daughter is local, but is getting ready to move to [place], where her children live with their father.  The other daughter lives near [place], [place].   Past Surgical History:  Procedure Laterality Date  . APPENDECTOMY    . BACK SURGERY     18 back surgeries (2 thoracic & 16 lumbar) (01/31/2016)  . DILATION AND CURETTAGE OF UTERUS    . HAMMER TOE SURGERY    . JOINT REPLACEMENT    . KNEE ARTHROSCOPY Right   . LAPAROSCOPIC CHOLECYSTECTOMY    . LUMBAR FUSION N/A 08/01/2015   Procedure: Right sided L1-2 and L2-3 transforaminal lumbar interbody fusion with cages, Extension of posterior fusion T12 to L3, Replaced pedicle screws bilaterally L1-L2 , Replaced left sided pedicle screws L-3. Instrumentation T12 to L3 using local bone graft, Vivigen allograft and cancellous chips;  Surgeon: [name], MD;  Location: [facility];  Service: Orthopedics;  Laterality: N/A;  . LUMBAR LAMINECTOMY/DECOMPRESSION MICRODISCECTOMY N/A 01/28/2014   Procedure: Minimally Invasive Right  L1-2 Microdiscectomy;  Surgeon: [name], MD;  Location: [facility];  Service: Orthopedics;  Laterality: N/A;  . TONSILLECTOMY    . TOTAL HIP ARTHROPLASTY Right   .  TOTAL KNEE ARTHROPLASTY  05/29/2012   Procedure: TOTAL KNEE ARTHROPLASTY;  Surgeon: Mcarthur Rossetti, MD;  Location: [facility];  Service: Orthopedics;  Laterality: Right;  Right Total Knee Arthroplasty  . TUBAL LIGATION    . UVULOPALATOPHARYNGOPLASTY    . VAGINAL HYSTERECTOMY     Past Medical History:  Diagnosis Date  . Active smoker   . Anxiety   . Calcifying tendinitis of shoulder   . Chronic back pain   . Chronic pain syndrome   . COPD (chronic obstructive pulmonary disease) ([place])   . Dysthymic disorder   . Emphysema lung ([place])   . GERD (gastroesophageal reflux disease)   . Headache    "weekly maybe" (01/31/2016)  . Heart murmur    for years, nothing to be concerned about  . Herpes genitalia   . History of blood transfusion 1980    related to "back surgery"  . Hyperlipidemia   . Hypertension   . Hypothyroidism   . Lumbago   . Osteoarthrosis, unspecified whether generalized or localized, lower leg   . Pain in joint, upper arm   . Pneumothorax, left 01/31/2016   S/P Left posterior subcostal pain injection on 01/30/2016  . PONV (postoperative nausea and vomiting)    gets nauseous  with longer surgery. Difficuty voiding after surgery  . Postlaminectomy syndrome, thoracic region   . Primary localized osteoarthrosis, lower leg   . Restless leg syndrome   . Sleep apnea    s/p surgery- last sleep study 2011- doesnt use oxygen or machine at night as instructed,.   12/2014- Dr Halford Chessman  reports it is negative.  . Thyroid disease    BP 113/77   Pulse 96   Resp 14   SpO2 94%   Opioid Risk Score:   Fall Risk Score:  `1  Depression screen PHQ 2/9  Depression screen [facility] 2/9 05/15/2016 05/02/2016 12/07/2015 11/15/2015 10/13/2015 09/06/2015 05/22/2015  Decreased Interest 0 0 0 0 0 0 0  Down, Depressed, Hopeless 0 0 0 0 - 0 0  PHQ - 2 Score 0 0 0 0 0 0 0  Altered sleeping - - - - - - -  Tired, decreased energy - - - - - - -  Change in appetite - - - - - - -  Feeling bad or failure about yourself  - - - - - - -  Trouble concentrating - - - - - - -  Moving slowly or fidgety/restless - - - - - - -  Suicidal thoughts - - - - - - -  PHQ-9 Score - - - - - - -  Some recent data might be hidden      Review of Systems  Constitutional: Positive for unexpected weight change.  HENT: Negative.   Eyes: Negative.   Respiratory: Negative.   Cardiovascular: Negative.   Gastrointestinal: Positive for constipation.  Endocrine: Negative.   Genitourinary: Negative.   Musculoskeletal: Negative.   Skin: Negative.   Allergic/Immunologic: Negative.   Neurological: Negative.   Hematological: Negative.   Psychiatric/Behavioral: Negative.   All other systems reviewed and are negative.      Objective:   Physical Exam  Constitutional: She  is oriented to person, place, and time. She appears well-developed and well-nourished.  HENT:  Head: Normocephalic and atraumatic.  Neck: Normal range of motion. Neck supple.  Cardiovascular: Normal rate and regular rhythm.   Pulmonary/Chest: Effort normal and breath sounds normal.  Musculoskeletal:  Normal Muscle Bulk and Muscle Testing Reveals: Upper  Extremities: Full ROM and Muscle Strength 5/5  Back without spinal tenderness noted Lower Extremities: Full ROM and Muscle Strength 5/5 Arises from Table with ease Narrow Based Gait  Neurological: She is alert and oriented to person, place, and time.  Skin: Skin is warm and dry.  Psychiatric: She has a normal mood and affect.  Nursing note and vitals reviewed.         Assessment & Plan:  1. Chronic lumbar spine pain/post-lami syndrome: Refilled: Fentanyl 50 MCG one patch every three days #10 and Hydrocodone 10/325 mg one tablet every 6 hours as needed for pain #120. We will continue the opioid monitoring program, this consists of regular clinic visits, examinations, urine drug screen, pill counts as well as use of [place] Controlled Substance Reporting System. 2. Osteoarthritis of the knees bilaterally:No complaints today. Continue with Heat, exercise and voltaren gel.  3. Rotator cuff syndrome/subacromial bursitis: No complaints voiced today. 4. Restless legs syndrome: Continue Requip.Continue to monitor 5. Tobacco Abuse: Encourage Smoking Cessation: Pulmonologist Following 6. Emphysema: Pulmonology Following 7. Muscle Spasm: Continue Soma 8. Insomnia: Continue Seroquel 9. Neuropathic Pain: Continue Gabapentin   20 minutes of face to face patient care time was spent during this visit. All questions were encouraged and answered.   F/u in 1 month

## 2016-09-09 ENCOUNTER — Ambulatory Visit (INDEPENDENT_AMBULATORY_CARE_PROVIDER_SITE_OTHER): Payer: Medicare Other | Admitting: Specialist

## 2016-09-09 ENCOUNTER — Other Ambulatory Visit: Payer: Self-pay | Admitting: Internal Medicine

## 2016-09-09 ENCOUNTER — Telehealth: Payer: Self-pay | Admitting: Emergency Medicine

## 2016-09-09 MED ORDER — VENLAFAXINE HCL ER 150 MG PO CP24: 300.0000 mg | ORAL_CAPSULE | Freq: Every day | ORAL | 5 refills | Status: DC

## 2016-09-09 NOTE — Telephone Encounter (Signed)
We can try the cream for the outside area.  I am not sure if this is cheaper or not - I am not able to tell.  She may want to ask her pharmacy if they can recommend cheaper options based on her insurance.   rx pending - not sure if she wants it to go to [facility] or [facility].

## 2016-09-09 NOTE — Telephone Encounter (Signed)
Received refill for effexor, you have not filled this before. Okay to fill?

## 2016-09-09 NOTE — Telephone Encounter (Signed)
Pt states the medication hydrocortisone (ANUSOL-HC) 25 MG suppository [[id number]]  Was not covered by insurance and and was going to be too expensive.  She states [facility] on [address] was suppose to call in something and she has left msgs here. I don't see any but she was really hoping you can take care of this.  She states she is in great pain

## 2016-09-09 NOTE — Telephone Encounter (Signed)
RX has been sent.

## 2016-09-09 NOTE — Telephone Encounter (Signed)
Please advise 

## 2016-09-09 NOTE — Telephone Encounter (Signed)
Ok to fill - new patient.

## 2016-09-10 MED ORDER — HYDROCORTISONE 2.5 % RE CREA: 1.0000 "application " | TOPICAL_CREAM | Freq: Two times a day (BID) | RECTAL | 0 refills | Status: DC

## 2016-09-10 NOTE — Telephone Encounter (Signed)
LVM informing pt

## 2016-09-10 NOTE — Telephone Encounter (Signed)
Pt return call back she is wanting refill sent to [facility]. Sent rx electronically,,,/lmb

## 2016-09-19 ENCOUNTER — Telehealth: Payer: Self-pay | Admitting: Registered Nurse

## 2016-09-19 ENCOUNTER — Ambulatory Visit (INDEPENDENT_AMBULATORY_CARE_PROVIDER_SITE_OTHER): Payer: Medicare Other | Admitting: Specialist

## 2016-09-19 NOTE — Telephone Encounter (Signed)
On January 18,2018 NCCSR was reviewed: No conflict was seen on the [place] with Multiple Prescribers. Ms. Pirro has a signed  Narcotic Contract with our office. If there were any discrepancies this would have been reported to her  Physcian.

## 2016-09-26 ENCOUNTER — Ambulatory Visit (INDEPENDENT_AMBULATORY_CARE_PROVIDER_SITE_OTHER): Payer: Medicare HMO | Admitting: Specialist

## 2016-09-26 ENCOUNTER — Ambulatory Visit (INDEPENDENT_AMBULATORY_CARE_PROVIDER_SITE_OTHER): Payer: Medicare HMO

## 2016-09-26 ENCOUNTER — Encounter (INDEPENDENT_AMBULATORY_CARE_PROVIDER_SITE_OTHER): Payer: Self-pay

## 2016-09-26 ENCOUNTER — Telehealth (INDEPENDENT_AMBULATORY_CARE_PROVIDER_SITE_OTHER): Payer: Self-pay | Admitting: Specialist

## 2016-09-26 ENCOUNTER — Encounter (INDEPENDENT_AMBULATORY_CARE_PROVIDER_SITE_OTHER): Payer: Self-pay | Admitting: Specialist

## 2016-09-26 ENCOUNTER — Other Ambulatory Visit: Payer: Self-pay | Admitting: Physical Medicine & Rehabilitation

## 2016-09-26 VITALS — BP 118/77 | HR 84 | Ht 64.0 in | Wt 159.0 lb

## 2016-09-26 DIAGNOSIS — M545 Low back pain: Secondary | ICD-10-CM

## 2016-09-26 DIAGNOSIS — G8929 Other chronic pain: Secondary | ICD-10-CM

## 2016-09-26 DIAGNOSIS — Z9119 Patient's noncompliance with other medical treatment and regimen: Secondary | ICD-10-CM | POA: Diagnosis not present

## 2016-09-26 DIAGNOSIS — M546 Pain in thoracic spine: Secondary | ICD-10-CM

## 2016-09-26 DIAGNOSIS — M533 Sacrococcygeal disorders, not elsewhere classified: Secondary | ICD-10-CM | POA: Diagnosis not present

## 2016-09-26 DIAGNOSIS — K6289 Other specified diseases of anus and rectum: Secondary | ICD-10-CM | POA: Diagnosis not present

## 2016-09-26 NOTE — Progress Notes (Signed)
Office Visit Note   Patient: Jill Phillips           Date of Birth: [birth year]           MRN: [id number] Visit Date: 09/26/2016              Requested by: [name], MD [place], [place] [address] PCP: [name], MD   Assessment & Plan: Visit Diagnoses:  1. Coccygodynia   2. Chronic bilateral low back pain without sciatica   3. Pain in thoracic spine   4. Patient left before treatment completed     Plan: Patient left before the end of her evaluation.  Follow-Up Instructions: Return if symptoms worsen or fail to improve.   Orders:  Orders Placed This Encounter  Procedures  . XR Thoracic Spine 2 View  . XR Lumbar Spine 2-3 Views  . XR Sacrum/Coccyx   No orders of the defined types were placed in this encounter.     Procedures: No procedures performed   Clinical Data: No additional findings.   Subjective: Chief Complaint  Patient presents with  . Lower Back - Pain, Follow-up    Jill Phillips is here to follow up on her back pain.  She states that she went to her PCP for what she thought was hemrroids, but after being examed that it was not. They tried several medications that did not help her. She is wondering if this related to her back pain.  She did have an injection on 07/09/2016 and she states that she did get some relief for a little while. Having pain that is perianal and over the lower perineum and upper medial thigh and anal pain. PCP initially was concerned about hemorrhoids but a visual inspection by her PCP was negative for hemhrroids.     Review of Systems   Objective: Vital Signs: BP 118/77 (BP Location: Left Arm, Patient Position: Sitting)   Pulse 84   Ht 5\' 4"  (1.626 m)   Wt 159 lb (72.1 kg)   BMI 27.29 kg/m   Physical Exam  Constitutional: She is oriented to person, place, and time. She appears well-developed and well-nourished.  HENT:  Head: Normocephalic and atraumatic.  Eyes: EOM are normal. Pupils are equal, round,  and reactive to light.  Neck: Normal range of motion. Neck supple.  Pulmonary/Chest: Effort normal and breath sounds normal.  Abdominal: Soft. Bowel sounds are normal.  Neurological: She is alert and oriented to person, place, and time.  Skin: Skin is warm and dry.  Psychiatric: She has a normal mood and affect. Her behavior is normal. Judgment and thought content normal.    Back Exam   Tenderness  The patient is experiencing tenderness in the lumbar.  Range of Motion  Extension: normal  Flexion: abnormal  Lateral Bend Right: abnormal  Lateral Bend Left: abnormal  Rotation Right: abnormal  Rotation Left: abnormal   Muscle Strength  Right Quadriceps:  5/5  Left Quadriceps:  5/5  Right Hamstrings:  5/5   Tests  Straight leg raise right: negative Straight leg raise left: negative  Reflexes  Patellar: normal Achilles: abnormal Biceps: normal Babinski's sign: normal   Other  Toe Walk: normal Heel Walk: normal Sensation: normal Gait: normal  Erythema: no back redness Scars: present  Comments:  She is tender over the right SI joint and lateral right greater trochanter. Complains mainly of perianal burning pain. This area was not examined physically.  Specialty Comments:  No specialty comments available.  Imaging: No results found.   PMFS History: Patient Active Problem List   Diagnosis Date Noted  . Degenerative disc disease, lumbar 08/01/2015    Priority: High    Class: Chronic  . Spondylolisthesis of lumbar region 08/01/2015    Priority: High    Class: Chronic  . CKD (chronic kidney disease) stage 3, GFR 30-59 ml/min 08/07/2016  . GERD (gastroesophageal reflux disease) 08/07/2016  . Prediabetes 08/07/2016  . Chronic respiratory failure ([place]) 07/29/2016  . Hypersomnia 07/29/2016  . Arthritis of carpometacarpal [facility]) joint of left thumb 07/09/2016  . Lump of axilla 05/02/2016  . Pneumothorax on left 01/31/2016  . Chronic, continuous use of  opioids 09/06/2015  . Urinary, incontinence, stress female 05/06/2014  . Constipation 05/05/2014  . 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 HTN (hypertension) 05/19/2013  . HLD (hyperlipidemia) 05/19/2013  . Depression 05/19/2013  . Hypothyroidism 05/19/2013  . Tobacco abuse   . Osteoarthritis of both knees 11/18/2011  . RLS (restless legs syndrome) 11/18/2011   Past Medical History:  Diagnosis Date  . Active smoker   . Anxiety   . Calcifying tendinitis of shoulder   . Chronic back pain   . Chronic pain syndrome   . COPD (chronic obstructive pulmonary disease) ([place])   . Dysthymic disorder   . Emphysema lung ([place])   . GERD (gastroesophageal reflux disease)   . Headache    "weekly maybe" (01/31/2016)  . Heart murmur    for years, nothing to be concerned about  . Herpes genitalia   . History of blood transfusion 1980   related to "back surgery"  . Hyperlipidemia   . Hypertension   . Hypothyroidism   . Lumbago   . Osteoarthrosis, unspecified whether generalized or localized, lower leg   . Pain in joint, upper arm   . Pneumothorax, left 01/31/2016   S/P Left posterior subcostal pain injection on 01/30/2016  . PONV (postoperative nausea and vomiting)    gets nauseous  with longer surgery. Difficuty voiding after surgery  . Postlaminectomy syndrome, thoracic region   . Primary localized osteoarthrosis, lower leg   . Restless leg syndrome   . Sleep apnea    s/p surgery- last sleep study 2011- doesnt use oxygen or machine at night as instructed,.   12/2014- Dr Halford Chessman  reports it is negative.  . Thyroid disease     Family History  Problem Relation Age of Onset  . Kidney disease Mother   . Heart disease Father   . Anuerysm Brother 29    brain  . Heart disease Brother   . Heart disease Sister 38    s/p CABG  . Hypertension Sister     Past Surgical History:  Procedure Laterality Date  . APPENDECTOMY    . BACK SURGERY     18 back surgeries (2 thoracic & 16 lumbar) (01/31/2016)  .  DILATION AND CURETTAGE OF UTERUS    . HAMMER TOE SURGERY    . JOINT REPLACEMENT    . KNEE ARTHROSCOPY Right   . LAPAROSCOPIC CHOLECYSTECTOMY    . LUMBAR FUSION N/A 08/01/2015   Procedure: Right sided L1-2 and L2-3 transforaminal lumbar interbody fusion with cages, Extension of posterior fusion T12 to L3, Replaced pedicle screws bilaterally L1-L2 , Replaced left sided pedicle screws L-3. Instrumentation T12 to L3 using local bone graft, Vivigen allograft and cancellous chips;  Surgeon: [name], MD;  Location: [facility];  Service: Orthopedics;  Laterality: N/A;  .  LUMBAR LAMINECTOMY/DECOMPRESSION MICRODISCECTOMY N/A 01/28/2014   Procedure: Minimally Invasive Right  L1-2 Microdiscectomy;  Surgeon: [name], MD;  Location: [facility];  Service: Orthopedics;  Laterality: N/A;  . TONSILLECTOMY    . TOTAL HIP ARTHROPLASTY Right   . TOTAL KNEE ARTHROPLASTY  05/29/2012   Procedure: TOTAL KNEE ARTHROPLASTY;  Surgeon: Mcarthur Rossetti, MD;  Location: [facility];  Service: Orthopedics;  Laterality: Right;  Right Total Knee Arthroplasty  . TUBAL LIGATION    . UVULOPALATOPHARYNGOPLASTY    . VAGINAL HYSTERECTOMY     Social History   Occupational History  . disability     back surgeries   Social History Main Topics  . Smoking status: Current Every Day Smoker    Packs/day: 1.25    Years: 45.00    Types: Cigarettes  . Smokeless tobacco: Never Used  . Alcohol use No  . Drug use: Yes    Types: Marijuana     Comment: 01/31/2016 "none since ~ 1980"  . Sexual activity: No

## 2016-09-27 ENCOUNTER — Other Ambulatory Visit: Payer: Self-pay | Admitting: Physical Medicine & Rehabilitation

## 2016-09-27 NOTE — Telephone Encounter (Signed)
Pt called stating she was here for her appt yesterday 1/25 but had to leave because she was in pain. Pt asking if she needed to have a myelogram done.

## 2016-09-30 ENCOUNTER — Other Ambulatory Visit (INDEPENDENT_AMBULATORY_CARE_PROVIDER_SITE_OTHER): Payer: Self-pay | Admitting: Specialist

## 2016-09-30 DIAGNOSIS — M533 Sacrococcygeal disorders, not elsewhere classified: Secondary | ICD-10-CM

## 2016-09-30 DIAGNOSIS — Z981 Arthrodesis status: Secondary | ICD-10-CM

## 2016-09-30 DIAGNOSIS — M546 Pain in thoracic spine: Secondary | ICD-10-CM

## 2016-09-30 DIAGNOSIS — G8929 Other chronic pain: Secondary | ICD-10-CM

## 2016-09-30 DIAGNOSIS — K6289 Other specified diseases of anus and rectum: Secondary | ICD-10-CM

## 2016-09-30 NOTE — Telephone Encounter (Signed)
Lumbar and thoracic MRI scans ordered. Can not perform contrast in myelogram or MRI due to grade 3 kidney disease.

## 2016-09-30 NOTE — Telephone Encounter (Signed)
Pt called back about this

## 2016-10-01 ENCOUNTER — Telehealth: Payer: Self-pay | Admitting: Emergency Medicine

## 2016-10-01 ENCOUNTER — Other Ambulatory Visit (INDEPENDENT_AMBULATORY_CARE_PROVIDER_SITE_OTHER): Payer: Medicare HMO

## 2016-10-01 DIAGNOSIS — N183 Chronic kidney disease, stage 3 unspecified: Secondary | ICD-10-CM

## 2016-10-01 DIAGNOSIS — E039 Hypothyroidism, unspecified: Secondary | ICD-10-CM

## 2016-10-01 DIAGNOSIS — R7303 Prediabetes: Secondary | ICD-10-CM

## 2016-10-01 LAB — CBC WITH DIFFERENTIAL/PLATELET
Basophils Absolute: 0.1 10*3/uL (ref 0.0–0.1)
Basophils Relative: 0.7 % (ref 0.0–3.0)
Eosinophils Absolute: 0.1 10*3/uL (ref 0.0–0.7)
Eosinophils Relative: 1.4 % (ref 0.0–5.0)
HCT: 37.7 % (ref 36.0–46.0)
Hemoglobin: 12.7 g/dL (ref 12.0–15.0)
Lymphocytes Relative: 27.7 % (ref 12.0–46.0)
Lymphs Abs: 2.7 10*3/uL (ref 0.7–4.0)
MCHC: 33.8 g/dL (ref 30.0–36.0)
MCV: 95.5 fl (ref 78.0–100.0)
Monocytes Absolute: 0.6 10*3/uL (ref 0.1–1.0)
Monocytes Relative: 6.2 % (ref 3.0–12.0)
Neutro Abs: 6.2 10*3/uL (ref 1.4–7.7)
Neutrophils Relative %: 64 % (ref 43.0–77.0)
Platelets: 263 10*3/uL (ref 150.0–400.0)
RBC: 3.95 Mil/uL (ref 3.87–5.11)
RDW: 14.9 % (ref 11.5–15.5)
WBC: 9.7 10*3/uL (ref 4.0–10.5)

## 2016-10-01 LAB — COMPREHENSIVE METABOLIC PANEL
ALT: 10 U/L (ref 0–35)
AST: 15 U/L (ref 0–37)
Albumin: 4.2 g/dL (ref 3.5–5.2)
Alkaline Phosphatase: 119 U/L — ABNORMAL HIGH (ref 39–117)
BUN: 17 mg/dL (ref 6–23)
CO2: 34 mEq/L — ABNORMAL HIGH (ref 19–32)
Calcium: 9.5 mg/dL (ref 8.4–10.5)
Chloride: 108 mEq/L (ref 96–112)
Creatinine, Ser: 1.03 mg/dL (ref 0.40–1.20)
GFR: 57.78 mL/min — ABNORMAL LOW (ref >60.00)
Glucose, Bld: 92 mg/dL (ref 70–99)
Potassium: 3.8 mEq/L (ref 3.5–5.1)
Sodium: 145 mEq/L (ref 135–145)
Total Bilirubin: 0.3 mg/dL (ref 0.2–1.2)
Total Protein: 7.2 g/dL (ref 6.0–8.3)

## 2016-10-01 LAB — TSH: TSH: 0.5 u[IU]/mL (ref 0.35–4.50)

## 2016-10-01 LAB — HEMOGLOBIN A1C: Hgb A1c MFr Bld: 6.2 % (ref 4.6–6.5)

## 2016-10-01 NOTE — Telephone Encounter (Signed)
Pt would like blood work done bc she was unable have her scheduled myelogram done due to Kidney Disease. Blood work is entered and pt is aware by daughter.

## 2016-10-01 NOTE — Telephone Encounter (Signed)
Pt called and asked that you give her a call back. She states it questions about kidney disease. Please advise thanks.

## 2016-10-01 NOTE — Telephone Encounter (Signed)
I called and advised patient of message below, she states that she does not have kidney disease. I advised that it was listed as a problem for and it would not let Dr. [name] put orders in for the Myelogram,  However at last check her levels were out of normal range and this was on 06/07/2016. She states that she is going to call her PCP and see if she can find out more about this.

## 2016-10-01 NOTE — Telephone Encounter (Signed)
LVM for pt to call back.

## 2016-10-01 NOTE — Telephone Encounter (Signed)
Patient has called back.  Would like a call back.  Patient states she was referred to have a monogram?  States could not have this done b/c of her kidney levels being at a 3.  Patient is requesting labs as well.

## 2016-10-02 ENCOUNTER — Telehealth: Payer: Self-pay | Admitting: Emergency Medicine

## 2016-10-02 NOTE — Telephone Encounter (Signed)
LVM informing pt

## 2016-10-02 NOTE — Telephone Encounter (Signed)
See result note.  

## 2016-10-02 NOTE — Telephone Encounter (Signed)
Pt is asking about her blood work, please advise.

## 2016-10-04 ENCOUNTER — Encounter: Payer: Self-pay | Admitting: Registered Nurse

## 2016-10-04 ENCOUNTER — Encounter: Payer: Medicare HMO | Attending: Physical Medicine & Rehabilitation | Admitting: Registered Nurse

## 2016-10-04 VITALS — BP 124/81 | HR 80

## 2016-10-04 DIAGNOSIS — M4316 Spondylolisthesis, lumbar region: Secondary | ICD-10-CM

## 2016-10-04 DIAGNOSIS — G8929 Other chronic pain: Secondary | ICD-10-CM

## 2016-10-04 DIAGNOSIS — Z79899 Other long term (current) drug therapy: Secondary | ICD-10-CM | POA: Diagnosis not present

## 2016-10-04 DIAGNOSIS — M519 Unspecified thoracic, thoracolumbar and lumbosacral intervertebral disc disorder: Secondary | ICD-10-CM | POA: Diagnosis not present

## 2016-10-04 DIAGNOSIS — M2170 Unequal limb length (acquired), unspecified site: Secondary | ICD-10-CM | POA: Insufficient documentation

## 2016-10-04 DIAGNOSIS — M170 Bilateral primary osteoarthritis of knee: Secondary | ICD-10-CM | POA: Insufficient documentation

## 2016-10-04 DIAGNOSIS — G894 Chronic pain syndrome: Secondary | ICD-10-CM | POA: Insufficient documentation

## 2016-10-04 DIAGNOSIS — Z4789 Encounter for other orthopedic aftercare: Secondary | ICD-10-CM | POA: Diagnosis not present

## 2016-10-04 DIAGNOSIS — E079 Disorder of thyroid, unspecified: Secondary | ICD-10-CM | POA: Diagnosis not present

## 2016-10-04 DIAGNOSIS — Z9981 Dependence on supplemental oxygen: Secondary | ICD-10-CM | POA: Insufficient documentation

## 2016-10-04 DIAGNOSIS — M62830 Muscle spasm of back: Secondary | ICD-10-CM

## 2016-10-04 DIAGNOSIS — G9619 Other disorders of meninges, not elsewhere classified: Secondary | ICD-10-CM | POA: Insufficient documentation

## 2016-10-04 DIAGNOSIS — M47819 Spondylosis without myelopathy or radiculopathy, site unspecified: Secondary | ICD-10-CM | POA: Diagnosis not present

## 2016-10-04 DIAGNOSIS — E785 Hyperlipidemia, unspecified: Secondary | ICD-10-CM | POA: Insufficient documentation

## 2016-10-04 DIAGNOSIS — M546 Pain in thoracic spine: Secondary | ICD-10-CM | POA: Diagnosis not present

## 2016-10-04 DIAGNOSIS — Z9889 Other specified postprocedural states: Secondary | ICD-10-CM | POA: Diagnosis not present

## 2016-10-04 DIAGNOSIS — K219 Gastro-esophageal reflux disease without esophagitis: Secondary | ICD-10-CM | POA: Insufficient documentation

## 2016-10-04 DIAGNOSIS — Z720 Tobacco use: Secondary | ICD-10-CM | POA: Diagnosis not present

## 2016-10-04 DIAGNOSIS — M5136 Other intervertebral disc degeneration, lumbar region: Secondary | ICD-10-CM

## 2016-10-04 DIAGNOSIS — G2581 Restless legs syndrome: Secondary | ICD-10-CM | POA: Insufficient documentation

## 2016-10-04 DIAGNOSIS — Z981 Arthrodesis status: Secondary | ICD-10-CM | POA: Diagnosis not present

## 2016-10-04 DIAGNOSIS — M791 Myalgia: Secondary | ICD-10-CM

## 2016-10-04 DIAGNOSIS — I10 Essential (primary) hypertension: Secondary | ICD-10-CM | POA: Diagnosis not present

## 2016-10-04 DIAGNOSIS — M7918 Myalgia, other site: Secondary | ICD-10-CM

## 2016-10-04 DIAGNOSIS — M5126 Other intervertebral disc displacement, lumbar region: Secondary | ICD-10-CM | POA: Insufficient documentation

## 2016-10-04 DIAGNOSIS — J449 Chronic obstructive pulmonary disease, unspecified: Secondary | ICD-10-CM | POA: Insufficient documentation

## 2016-10-04 DIAGNOSIS — Z5181 Encounter for therapeutic drug level monitoring: Secondary | ICD-10-CM | POA: Insufficient documentation

## 2016-10-04 DIAGNOSIS — M961 Postlaminectomy syndrome, not elsewhere classified: Secondary | ICD-10-CM | POA: Diagnosis not present

## 2016-10-04 DIAGNOSIS — E039 Hypothyroidism, unspecified: Secondary | ICD-10-CM | POA: Insufficient documentation

## 2016-10-04 MED ORDER — FENTANYL 50 MCG/HR TD PT72: 50.0000 ug | MEDICATED_PATCH | TRANSDERMAL | 0 refills | Status: DC

## 2016-10-04 MED ORDER — HYDROCODONE-ACETAMINOPHEN 10-325 MG PO TABS: 1.0000 | ORAL_TABLET | Freq: Four times a day (QID) | ORAL | 0 refills | Status: DC | PRN

## 2016-10-04 NOTE — Telephone Encounter (Signed)
Better to be safe in regards to the kidneys, sometimes find out afterward that it is a burden especially if she has diabetes. jen

## 2016-10-04 NOTE — Progress Notes (Signed)
Subjective:    Patient ID: Jill Phillips, female    DOB: 01/23/1956, 61 y.o.   MRN: [id number]  HPI:  Jill Phillips is a 61 year old female who returns for follow up appointment for chronic pain and medication refill. She states her pain is located in her lower back and rectal pain. States her PCP following her rectal pain. She rates her pain 7. Her current exercise regime is walking and performing stretching exercises. Daughter in room  Pain Inventory Average Pain 7 Pain Right Now 7 My pain is .  In the last 24 hours, has pain interfered with the following? General activity 6 Relation with others 0 Enjoyment of life 4 What TIME of day is your pain at its worst? daytime Sleep (in general) .  Pain is worse with: walking and sitting Pain improves with: . Relief from Meds: .  Mobility Do you have any goals in this area?  no  Function Do you have any goals in this area?  no  Neuro/Psych No problems in this area  Prior Studies CT/MRI  Physicians involved in your care Any changes since last visit?  no   Family History  Problem Relation Age of Onset  . Kidney disease Mother   . Heart disease Father   . Anuerysm Brother 29    brain  . Heart disease Brother   . Heart disease Sister 9    s/p CABG  . Hypertension Sister    Social History   Social History  . Marital status: Divorced    Spouse name: n/a  . Number of children: 2  . Years of education: 12+   Occupational History  . disability     back surgeries   Social History Main Topics  . Smoking status: Current Every Day Smoker    Packs/day: 1.25    Years: 45.00    Types: Cigarettes  . Smokeless tobacco: Never Used  . Alcohol use No  . Drug use: Yes    Types: Marijuana     Comment: 01/31/2016 "none since ~ 1980"  . Sexual activity: No   Other Topics Concern  . Not on file   Social History Narrative   Lives alone.  One daughter is local, but is getting ready to move to [place], where her  children live with their father.  The other daughter lives near [place], [place].   Past Surgical History:  Procedure Laterality Date  . APPENDECTOMY    . BACK SURGERY     18 back surgeries (2 thoracic & 16 lumbar) (01/31/2016)  . DILATION AND CURETTAGE OF UTERUS    . HAMMER TOE SURGERY    . JOINT REPLACEMENT    . KNEE ARTHROSCOPY Right   . LAPAROSCOPIC CHOLECYSTECTOMY    . LUMBAR FUSION N/A 08/01/2015   Procedure: Right sided L1-2 and L2-3 transforaminal lumbar interbody fusion with cages, Extension of posterior fusion T12 to L3, Replaced pedicle screws bilaterally L1-L2 , Replaced left sided pedicle screws L-3. Instrumentation T12 to L3 using local bone graft, Vivigen allograft and cancellous chips;  Surgeon: [name], MD;  Location: [facility];  Service: Orthopedics;  Laterality: N/A;  . LUMBAR LAMINECTOMY/DECOMPRESSION MICRODISCECTOMY N/A 01/28/2014   Procedure: Minimally Invasive Right  L1-2 Microdiscectomy;  Surgeon: [name], MD;  Location: [facility];  Service: Orthopedics;  Laterality: N/A;  . TONSILLECTOMY    . TOTAL HIP ARTHROPLASTY Right   . TOTAL KNEE ARTHROPLASTY  05/29/2012   Procedure: TOTAL KNEE  ARTHROPLASTY;  Surgeon: Mcarthur Rossetti, MD;  [facility];  Service: Orthopedics;  Laterality: Right;  Right Total Knee Arthroplasty  . TUBAL LIGATION    . UVULOPALATOPHARYNGOPLASTY    . VAGINAL HYSTERECTOMY     Past Medical History:  Diagnosis Date  . Active smoker   . Anxiety   . Calcifying tendinitis of shoulder   . Chronic back pain   . Chronic pain syndrome   . COPD (chronic obstructive pulmonary disease) ([place])   . Dysthymic disorder   . Emphysema lung ([place])   . GERD (gastroesophageal reflux disease)   . Headache    "weekly maybe" (01/31/2016)  . Heart murmur    for years, nothing to be concerned about  . Herpes genitalia   . History of blood transfusion 1980   related to "back surgery"  . Hyperlipidemia   . Hypertension   . Hypothyroidism   . Lumbago   .  Osteoarthrosis, unspecified whether generalized or localized, lower leg   . Pain in joint, upper arm   . Pneumothorax, left 01/31/2016   S/P Left posterior subcostal pain injection on 01/30/2016  . PONV (postoperative nausea and vomiting)    gets nauseous  with longer surgery. Difficuty voiding after surgery  . Postlaminectomy syndrome, thoracic region   . Primary localized osteoarthrosis, lower leg   . Restless leg syndrome   . Sleep apnea    s/p surgery- last sleep study 2011- doesnt use oxygen or machine at night as instructed,.   12/2014- Dr Halford Chessman  reports it is negative.  . Thyroid disease    There were no vitals taken for this visit.  Opioid Risk Score:   Fall Risk Score:  `1  Depression screen PHQ 2/9  Depression screen [facility] 2/9 05/15/2016 05/02/2016 12/07/2015 11/15/2015 10/13/2015 09/06/2015 05/22/2015  Decreased Interest 0 0 0 0 0 0 0  Down, Depressed, Hopeless 0 0 0 0 - 0 0  PHQ - 2 Score 0 0 0 0 0 0 0  Altered sleeping - - - - - - -  Tired, decreased energy - - - - - - -  Change in appetite - - - - - - -  Feeling bad or failure about yourself  - - - - - - -  Trouble concentrating - - - - - - -  Moving slowly or fidgety/restless - - - - - - -  Suicidal thoughts - - - - - - -  PHQ-9 Score - - - - - - -  Some recent data might be hidden   Review of Systems  Constitutional: Negative.   HENT: Negative.   Eyes: Negative.   Respiratory: Negative.   Cardiovascular: Negative.   Gastrointestinal: Negative.   Endocrine: Negative.   Genitourinary: Negative.   Musculoskeletal: Positive for arthralgias and back pain.  Skin: Negative.   Allergic/Immunologic: Negative.   Neurological: Negative.   Hematological: Negative.   Psychiatric/Behavioral: Negative.   All other systems reviewed and are negative.      Objective:   Physical Exam  Constitutional: She is oriented to person, place, and time. She appears well-developed and well-nourished.  HENT:  Head: Normocephalic and  atraumatic.  Neck: Normal range of motion. Neck supple.  Cardiovascular: Normal rate and regular rhythm.   Pulmonary/Chest: Effort normal and breath sounds normal.  Musculoskeletal:  Normal Muscle Bulk and Muscle Testing Reveals: Upper Extremities: Full ROM and Muscle Strength 5/5 Lumbar Paraspinal Tenderness: L-3-L-5 Lower Extremities: Full ROM and Muscle Strength 5/5 Arises  from Table with ease Narrow Based Gait  Neurological: She is alert and oriented to person, place, and time.  Skin: Skin is warm and dry.  Psychiatric: She has a normal mood and affect.  Nursing note and vitals reviewed.         Assessment & Plan:  1. Chronic lumbar spine pain/post-lami syndrome: 10/04/2016 Refilled: Fentanyl 50 MCG one patch every three days #10 and Hydrocodone 10/325 mg one tablet every 6 hours as needed for pain #120. We will continue the opioid monitoring program, this consists of regular clinic visits, examinations, urine drug screen, pill counts as well as use of [place] Controlled Substance Reporting System. 2. Osteoarthritis of the knees bilaterally:No complaints today. Continue with Heat, exercise and voltaren gel. 10/04/2016  3. Rotator cuff syndrome/subacromial bursitis: No complaints voiced today. 10/04/2016 4. Restless legs syndrome: Continue Requip.Continue to monitor. 10/04/2016 5. Tobacco Abuse: Encourage Smoking Cessation: Pulmonologist Following. 10/04/2016 6. Emphysema: Pulmonology Following. 10/04/2016 7. Muscle Spasm: Continue Soma. 10/04/2016 8. Insomnia: Continue Seroquel. 10/04/2016 9. Neuropathic Pain: Continue Gabapentin.10/04/2016  20 minutes of face to face patient care time was spent during this visit. All questions were encouraged and answered.  F/u in 1 month

## 2016-10-05 ENCOUNTER — Other Ambulatory Visit: Payer: Self-pay | Admitting: Registered Nurse

## 2016-10-05 DIAGNOSIS — M4316 Spondylolisthesis, lumbar region: Secondary | ICD-10-CM

## 2016-10-05 DIAGNOSIS — M5136 Other intervertebral disc degeneration, lumbar region: Secondary | ICD-10-CM

## 2016-10-05 DIAGNOSIS — Z79899 Other long term (current) drug therapy: Secondary | ICD-10-CM

## 2016-10-05 DIAGNOSIS — Z5181 Encounter for therapeutic drug level monitoring: Secondary | ICD-10-CM

## 2016-10-05 DIAGNOSIS — M7918 Myalgia, other site: Secondary | ICD-10-CM

## 2016-10-05 DIAGNOSIS — G894 Chronic pain syndrome: Secondary | ICD-10-CM

## 2016-10-08 NOTE — Patient Instructions (Signed)
MRI scan will be ordered based on her complaints of worsening pain right hip. She and her daughter left before evaluation could be completed, however motor testing and SLR were negative.

## 2016-10-11 ENCOUNTER — Other Ambulatory Visit: Payer: Self-pay | Admitting: Physical Medicine & Rehabilitation

## 2016-10-11 NOTE — Telephone Encounter (Signed)
Patient had MRIs done--- Dr. [name] has the reports to review.

## 2016-10-15 ENCOUNTER — Other Ambulatory Visit: Payer: Self-pay | Admitting: Physical Medicine & Rehabilitation

## 2016-10-15 ENCOUNTER — Other Ambulatory Visit (INDEPENDENT_AMBULATORY_CARE_PROVIDER_SITE_OTHER): Payer: Self-pay | Admitting: Specialist

## 2016-10-17 ENCOUNTER — Ambulatory Visit (INDEPENDENT_AMBULATORY_CARE_PROVIDER_SITE_OTHER): Payer: Medicare Other | Admitting: Specialist

## 2016-10-17 ENCOUNTER — Ambulatory Visit (INDEPENDENT_AMBULATORY_CARE_PROVIDER_SITE_OTHER): Payer: Medicare HMO | Admitting: Nurse Practitioner

## 2016-10-17 VITALS — BP 116/72 | HR 88 | Temp 98.8°F | Wt 158.0 lb

## 2016-10-17 DIAGNOSIS — K6289 Other specified diseases of anus and rectum: Secondary | ICD-10-CM | POA: Diagnosis not present

## 2016-10-17 MED ORDER — DILTIAZEM GEL 2 %: 1.0000 "application " | Freq: Two times a day (BID) | CUTANEOUS | 0 refills | Status: DC

## 2016-10-17 NOTE — Patient Instructions (Signed)
   Proctalgia Fugax Introduction Proctalgia fugax is a condition that involves very short episodes of intense pain in the rectum. The rectum is the last part of the large intestine. The pain can last from seconds to minutes. Episodes often occur during the night and awaken the person from sleep. This condition is not a sign of cancer, but your health care provider may want to rule out a number of other conditions. What are the causes? The cause of this condition is not known. One possible cause may be spasm of the pelvic muscles or of the lowest part of the large intestine. What are the signs or symptoms? The only symptom of this condition is rectal pain.  The pain may be intense or severe.  The pain may last for only a few seconds or it may last up to 30 minutes.  The pain may occur at night and wake you up from sleep. How is this diagnosed? This condition may be diagnosed by ruling out other problems that could cause the pain. Diagnosis may include:  Medical history and physical exam.  Various tests, such as:  Anoscopy. In this test, a lighted scope is put into the rectum to look for abnormalities.  Barium enema. In this test, X-rays are taken after a white chalky substance called barium is put into the colon. The barium makes it easier to see problems because it shows up well on the X-rays.  Blood tests to rule out infections or other problems. How is this treated? There is no specific treatment to cure this condition. Treatment options may include:  Medicines.  Warm baths.  Relaxation techniques.  Gentle massage of the painful area.  Biofeedback. Follow these instructions at home:  Take over-the-counter and prescription medicines only as told by your health care provider.  Follow instructions from your health care provider about diet.  Follow instructions from your health care provider about rest and physical activity.  Try warm baths, massaging the area, or  progressive relaxation techniques as told by your health care provider.  Keep all follow-up visits as told by your health care provider. This is important. Contact a health care provider if:  You develop new symptoms.  Your pain does not get better as soon as it usually does. This information is not intended to replace advice given to you by your health care provider. Make sure you discuss any questions you have with your health care provider. Document Released: 05/14/2001 Document Revised: 01/25/2016 Document Reviewed: 11/14/2014  2017 [employer]vier

## 2016-10-17 NOTE — Progress Notes (Signed)
Pre visit review using our clinic review tool, if applicable. No additional management support is needed unless otherwise documented below in the visit note. 

## 2016-10-17 NOTE — Progress Notes (Signed)
Subjective:  Patient ID: Jill Phillips, female    DOB: July 09, 1956  Age: 61 y.o. MRN: [id number]  CC: Pain (rectal pain//pressure/spray help her in past (dermoplast). )   HPI  Rectal discomfort: Presents with persistent rectal pain. No improvement with preparation H. Some improvement with pain medications. No change in bowel movements. Has chronic constipation, has not been using amitiza nor movantik as prescribed. Last BM today (hard pellets, no blood, no mucus.  Outpatient Medications Prior to Visit  Medication Sig Dispense Refill  . acetaminophen (TYLENOL) 500 MG tablet Take 1,000 mg by mouth every 6 (six) hours as needed for headache.     . albuterol (PROAIR HFA) 108 (90 Base) MCG/ACT inhaler INHALE 1 PUFF INTO THE LUNGS EVERY 6 HOURS AS NEEDED FOR WHEEZING OR SHORTNESS OF BREATH.NEED OFFICE VISIT FOR FURTHER REFILLS 8.5 g 0  . amLODipine (NORVASC) 10 MG tablet Take 1 tablet (10 mg total) by mouth daily. 30 tablet 3  . benazepril (LOTENSIN) 40 MG tablet Take 1 tablet (40 mg total) by mouth daily. 90 tablet 1  . carisoprodol (SOMA) 350 MG tablet Take 1 tablet (350 mg total) by mouth 3 (three) times daily. 90 tablet 3  . clonazePAM (KLONOPIN) 2 MG tablet TAKE 1 TABLET BY MOUTH 2 TIMES A DAY AS NEEDED. 60 tablet 2  . fentaNYL (DURAGESIC - DOSED MCG/HR) 50 MCG/HR Place 1 patch (50 mcg total) onto the skin every 3 (three) days. 10 patch 0  . fluticasone (FLONASE) 50 MCG/ACT nasal spray Place 1 spray into both nostrils daily. (Patient taking differently: Place 1 spray into both nostrils daily as needed for allergies. ) 16 g 5  . gabapentin (NEURONTIN) 300 MG capsule Take 1 capsule (300 mg total) by mouth 3 (three) times daily. 90 capsule 0  . gabapentin (NEURONTIN) 300 MG capsule Take 1 capsule (300 mg total) by mouth 3 (three) times daily. 90 capsule 2  . gabapentin (NEURONTIN) 300 MG capsule TAKE 1 CAPSULE BY MOUTH 2 TIMES DAILY. 60 capsule 5  . hydrochlorothiazide (HYDRODIURIL) 12.5 MG  tablet Take 1 tablet (12.5 mg total) by mouth daily. 90 tablet 1  . HYDROcodone-acetaminophen (NORCO) 10-325 MG tablet Take 1 tablet by mouth every 6 (six) hours as needed (breakthrough pain). 120 tablet 0  . levothyroxine (SYNTHROID, LEVOTHROID) 75 MCG tablet Take 1 tablet (75 mcg total) by mouth daily before breakfast. 30 tablet 11  . MOVANTIK 25 MG TABS tablet     . naproxen (NAPROSYN) 500 MG tablet TAKE 1 TABLET BY MOUTH TWICE A DAY WITH FOOD. 60 tablet 5  . nicotine (NICODERM CQ - DOSED IN MG/24 HOURS) 21 mg/24hr patch Place 1 patch (21 mg total) onto the skin daily. 28 patch 0  . omeprazole (PRILOSEC) 20 MG capsule TAKE 1 CAPSULE BY MOUTH 2 TIMES DAILY BEFORE A MEAL. 60 capsule 5  . pravastatin (PRAVACHOL) 40 MG tablet TAKE 1 TABLET BY MOUTH DAILY. 90 tablet PRN  . Psyllium (VEGETABLE LAXATIVE PO) Take 2 tablets by mouth at bedtime. Reported on 10/24/2015    . QUEtiapine (SEROQUEL) 100 MG tablet TAKE 1 TABLET BY MOUTH AT BEDTIME. 30 tablet 2  . rOPINIRole (REQUIP) 0.5 MG tablet TAKE 1 TABLET BY MOUTH AT BEDTIME FOR 1 WEEK THEN INCREASE TO 2 TABLETS AT BEDTIME. (STOP PRIMIDONE) (Patient taking differently: Take 0.5 mg by mouth at bedtime as needed. Restless legs) 60 tablet 2  . rOPINIRole (REQUIP) 0.5 MG tablet TAKE 2 TABLETS BY MOUTH AT BEDTIME. 60 tablet  1  . SPIRIVA HANDIHALER 18 MCG inhalation capsule PLACE 1 CAPSULE INTO INHALER AND INHALE DAILY. 30 capsule 2  . tiZANidine (ZANAFLEX) 4 MG tablet TAKE 1 TABLET BY MOUTH EVERY 8 HOURS AS NEEDED FOR MUSCLE PAIN/ SPASMS 90 tablet 3  . valACYclovir (VALTREX) 500 MG tablet TAKE 1 TABLET BY MOUTH DAILY. 30 tablet 11  . venlafaxine XR (EFFEXOR-XR) 150 MG 24 hr capsule Take 2 capsules (300 mg total) by mouth daily. 60 capsule 5  . VOLTAREN 1 % GEL APPLY 2 GRAMS TO AFFECTED AREA 3 TIMES A DAY AS NEEDED FOR PAIN. 300 g 4  . hydrocortisone (ANUSOL-HC) 2.5 % rectal cream Place 1 application rectally 2 (two) times daily. 30 g 0  . lubiprostone (AMITIZA)  24 MCG capsule Take 1 capsule (24 mcg total) by mouth 2 (two) times daily with a meal. 15 capsule 0   No facility-administered medications prior to visit.     ROS See HPI  Objective:  BP 116/72   Pulse 88   Temp 98.8 F (37.1 C)   Wt 158 lb (71.7 kg)   SpO2 98%   BMI 27.12 kg/m   BP Readings from Last 3 Encounters:  10/17/16 116/72  10/04/16 124/81  09/26/16 118/77    Wt Readings from Last 3 Encounters:  10/17/16 158 lb (71.7 kg)  09/26/16 159 lb (72.1 kg)  09/05/16 159 lb (72.1 kg)    Physical Exam  Constitutional: She is oriented to person, place, and time. No distress.  Neck: Normal range of motion. Neck supple.  Cardiovascular: Normal rate and regular rhythm.   Pulmonary/Chest: Effort normal. No respiratory distress.  Abdominal: Soft. She exhibits distension. There is no tenderness.  Musculoskeletal: Normal range of motion. She exhibits no edema.  Neurological: She is alert and oriented to person, place, and time.  Psychiatric: She has a normal mood and affect. Her behavior is normal.    Lab Results  Component Value Date   WBC 9.7 10/01/2016   HGB 12.7 10/01/2016   HCT 37.7 10/01/2016   PLT 263.0 10/01/2016   GLUCOSE 92 10/01/2016   CHOL 244 (H) 05/05/2014   TRIG 464 (H) 05/05/2014   HDL 53 05/05/2014   LDLDIRECT 145 (H) 05/06/2014   LDLCALC NOT CALC 05/05/2014   ALT 10 10/01/2016   AST 15 10/01/2016   NA 145 10/01/2016   K 3.8 10/01/2016   CL 108 10/01/2016   CREATININE 1.03 10/01/2016   BUN 17 10/01/2016   CO2 34 (H) 10/01/2016   TSH 0.50 10/01/2016   INR 0.87 05/26/2012   HGBA1C 6.2 10/01/2016    No results found.  Assessment & Plan:   Jill Phillips was seen today for pain.  Diagnoses and all orders for this visit:  Rectal pain -     Ambulatory referral to Gastroenterology -     Discontinue: diltiazem 2 % GEL; Apply 1 application topically 2 (two) times daily. -     diltiazem 2 % GEL; Apply 1 application topically 2 (two) times daily. Apply  pea-size to rectal region twice a day.   I have discontinued Ms. Trautmann's lubiprostone and hydrocortisone. I have also changed her diltiazem. Additionally, I am having her maintain her acetaminophen, Psyllium (VEGETABLE LAXATIVE PO), levothyroxine, amLODipine, fluticasone, pravastatin, rOPINIRole, nicotine, valACYclovir, VOLTAREN, hydrochlorothiazide, benazepril, gabapentin, carisoprodol, omeprazole, naproxen, QUEtiapine, clonazePAM, SPIRIVA HANDIHALER, albuterol, MOVANTIK, venlafaxine XR, gabapentin, gabapentin, fentaNYL, HYDROcodone-acetaminophen, rOPINIRole, and tiZANidine.  Meds ordered this encounter  Medications  . DISCONTD: diltiazem 2 % GEL  Sig: Apply 1 application topically 2 (two) times daily.    Dispense:  30 g    Refill:  0    Order Specific Question:   Supervising Provider    Answer:   Cassandria Anger [[id number]]  . diltiazem 2 % GEL    Sig: Apply 1 application topically 2 (two) times daily. Apply pea-size to rectal region twice a day.    Dispense:  30 g    Refill:  0    Order Specific Question:   Supervising Provider    Answer:   Cassandria Anger [[id number]]    Follow-up: Return if symptoms worsen or fail to improve.  Wilfred Lacy, NP

## 2016-10-18 ENCOUNTER — Telehealth (INDEPENDENT_AMBULATORY_CARE_PROVIDER_SITE_OTHER): Payer: Self-pay | Admitting: Radiology

## 2016-10-18 ENCOUNTER — Telehealth: Payer: Self-pay

## 2016-10-18 NOTE — Telephone Encounter (Signed)
I notified Jill Phillips that She can not take more of her medication, and if her pain is so bad she will need to seek treatment in the ED.

## 2016-10-18 NOTE — Telephone Encounter (Signed)
Patient called LMVM triage, said that she is in severe pain and wants to see JN as soon as she can for review MRI.  He has already told her she needs surgery.  She is having severe rectal pain.  She has called her pain clinic doctor and LM there to ask if she can take more of her pain meds.  She says that she has the cd of the MRI.  Please call her to see if you can give her an appt sooner?  She is currently scheduled for 11/14/16.

## 2016-10-18 NOTE — Telephone Encounter (Signed)
Jill Phillips called this morning, states would like to talk to [place], states rectal pain is no better with current medication regime, states did see internal medicine doctor yet pain does persisit, had a MRI complete and should have results in 1 month.  Please advise

## 2016-10-21 NOTE — Telephone Encounter (Signed)
Left message on machine for her to call back and we could work her in on the schedule.

## 2016-10-22 NOTE — Telephone Encounter (Signed)
Scheduled for 10/24/2016 @ 2pm

## 2016-10-24 ENCOUNTER — Ambulatory Visit (INDEPENDENT_AMBULATORY_CARE_PROVIDER_SITE_OTHER): Payer: Medicare HMO | Admitting: Specialist

## 2016-10-24 ENCOUNTER — Encounter (INDEPENDENT_AMBULATORY_CARE_PROVIDER_SITE_OTHER): Payer: Self-pay | Admitting: Specialist

## 2016-10-24 VITALS — BP 130/76 | HR 90 | Ht 64.0 in | Wt 159.0 lb

## 2016-10-24 DIAGNOSIS — K6289 Other specified diseases of anus and rectum: Secondary | ICD-10-CM

## 2016-10-24 DIAGNOSIS — M7062 Trochanteric bursitis, left hip: Secondary | ICD-10-CM

## 2016-10-24 DIAGNOSIS — M4724 Other spondylosis with radiculopathy, thoracic region: Secondary | ICD-10-CM

## 2016-10-24 NOTE — Progress Notes (Signed)
Office Visit Note   Patient: Jill Phillips           Date of Birth: Jan 09, 1956           MRN: [id number] Visit Date: 10/24/2016              Requested by: [name], MD [place], [place] [address] PCP: [name], MD   Assessment & Plan: Visit Diagnoses:  1. Proctalgia   2. Other spondylosis with radiculopathy, thoracic region   3. Trochanteric bursitis, left hip     Plan:Avoid frequent bending and stooping  No lifting greater than 10 lbs. May use ice or moist heat for pain. Weight loss is of benefit. Handicap license is approved. MRI with contrast of the thoracic and lumbar spine as there renal function is normal and there a question of scar at the L5-S1 level r/o enhancing lesion at L-S.    Follow-Up Instructions: Return in about 4 weeks (around 11/21/2016).   Orders:  Orders Placed This Encounter  Procedures  . MR THORACIC SPINE W CONTRAST  . MR LUMBAR SPINE W CONTRAST   No orders of the defined types were placed in this encounter.     Procedures: No procedures performed   Clinical Data: No additional findings.   Subjective: Chief Complaint  Patient presents with  . Lower Back - Follow-up    Post MRI  . Middle Back - Follow-up    Post MRI    Jill Phillips is here to review her MRI's of the Lumbar and Thoracic Spines. She states that she is still having severe pain at her rectum area.      Review of Systems   Objective: Vital Signs: BP 130/76 (BP Location: Left Arm, Patient Position: Sitting)   Pulse 90   Ht 5\' 4"  (1.626 m)   Wt 159 lb (72.1 kg)   BMI 27.29 kg/m   Physical Exam  Back Exam   Tenderness  The patient is experiencing tenderness in the lumbar, thoracic and sacroiliac.  Range of Motion  Extension: abnormal  Flexion: abnormal  Lateral Bend Right: abnormal  Lateral Bend Left: abnormal  Rotation Right: abnormal  Rotation Left: abnormal   Muscle Strength  Right Quadriceps:  5/5  Left Quadriceps:  5/5    Right Hamstrings:  5/5  Left Hamstrings:  5/5   Tests  Straight leg raise right: negative Straight leg raise left: negative  Reflexes  Patellar: normal Achilles: normal Babinski's sign: normal   Other  Toe Walk: normal Heel Walk: normal Sensation: normal Gait: normal   Comments:  Left T9 radiculopathy.      Specialty Comments:  No specialty comments available.  Imaging: No results found.   PMFS History: Patient Active Problem List   Diagnosis Date Noted  . Degenerative disc disease, lumbar 08/01/2015    Priority: High    Class: Chronic  . Spondylolisthesis of lumbar region 08/01/2015    Priority: High    Class: Chronic  . CKD (chronic kidney disease) stage 3, GFR 30-59 ml/min 08/07/2016  . GERD (gastroesophageal reflux disease) 08/07/2016  . Prediabetes 08/07/2016  . Chronic respiratory failure ([place]) 07/29/2016  . Hypersomnia 07/29/2016  . Arthritis of carpometacarpal [facility]) joint of left thumb 07/09/2016  . Lump of axilla 05/02/2016  . Pneumothorax on left 01/31/2016  . Chronic, continuous use of opioids 09/06/2015  . Urinary, incontinence, stress female 05/06/2014  . Constipation 05/05/2014  . 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 HTN (hypertension) 05/19/2013  .  HLD (hyperlipidemia) 05/19/2013  . Depression 05/19/2013  . Hypothyroidism 05/19/2013  . Tobacco abuse   . Osteoarthritis of both knees 11/18/2011  . RLS (restless legs syndrome) 11/18/2011   Past Medical History:  Diagnosis Date  . Active smoker   . Anxiety   . Calcifying tendinitis of shoulder   . Chronic back pain   . Chronic pain syndrome   . COPD (chronic obstructive pulmonary disease) ([place])   . Dysthymic disorder   . Emphysema lung ([place])   . GERD (gastroesophageal reflux disease)   . Headache    "weekly maybe" (01/31/2016)  . Heart murmur    for years, nothing to be concerned about  . Herpes genitalia   . History of blood transfusion 1980   related to "back surgery"  .  Hyperlipidemia   . Hypertension   . Hypothyroidism   . Lumbago   . Osteoarthrosis, unspecified whether generalized or localized, lower leg   . Pain in joint, upper arm   . Pneumothorax, left 01/31/2016   S/P Left posterior subcostal pain injection on 01/30/2016  . PONV (postoperative nausea and vomiting)    gets nauseous  with longer surgery. Difficuty voiding after surgery  . Postlaminectomy syndrome, thoracic region   . Primary localized osteoarthrosis, lower leg   . Restless leg syndrome   . Sleep apnea    s/p surgery- last sleep study 2011- doesnt use oxygen or machine at night as instructed,.   12/2014- Dr Halford Chessman  reports it is negative.  . Thyroid disease     Family History  Problem Relation Age of Onset  . Kidney disease Mother   . Heart disease Father   . Anuerysm Brother 29    brain  . Heart disease Brother   . Heart disease Sister 67    s/p CABG  . Hypertension Sister     Past Surgical History:  Procedure Laterality Date  . APPENDECTOMY    . BACK SURGERY     18 back surgeries (2 thoracic & 16 lumbar) (01/31/2016)  . DILATION AND CURETTAGE OF UTERUS    . HAMMER TOE SURGERY    . JOINT REPLACEMENT    . KNEE ARTHROSCOPY Right   . LAPAROSCOPIC CHOLECYSTECTOMY    . LUMBAR FUSION N/A 08/01/2015   Procedure: Right sided L1-2 and L2-3 transforaminal lumbar interbody fusion with cages, Extension of posterior fusion T12 to L3, Replaced pedicle screws bilaterally L1-L2 , Replaced left sided pedicle screws L-3. Instrumentation T12 to L3 using local bone graft, Vivigen allograft and cancellous chips;  Surgeon: [name], MD;  Location: [place];  Service: Orthopedics;  Laterality: N/A;  . LUMBAR LAMINECTOMY/DECOMPRESSION MICRODISCECTOMY N/A 01/28/2014   Procedure: Minimally Invasive Right  L1-2 Microdiscectomy;  Surgeon: [name], MD;  Location: [place];  Service: Orthopedics;  Laterality: N/A;  . TONSILLECTOMY    . TOTAL HIP ARTHROPLASTY Right   . TOTAL KNEE ARTHROPLASTY   05/29/2012   Procedure: TOTAL KNEE ARTHROPLASTY;  Surgeon: Mcarthur Rossetti, MD;  Location: [facility];  Service: Orthopedics;  Laterality: Right;  Right Total Knee Arthroplasty  . TUBAL LIGATION    . UVULOPALATOPHARYNGOPLASTY    . VAGINAL HYSTERECTOMY     Social History   Occupational History  . disability     back surgeries   Social History Main Topics  . Smoking status: Current Every Day Smoker    Packs/day: 1.25    Years: 45.00    Types: Cigarettes  . Smokeless tobacco: Never Used  .  Alcohol use No  . Drug use: Yes    Types: Marijuana     Comment: 01/31/2016 "none since ~ 1980"  . Sexual activity: No

## 2016-10-24 NOTE — Patient Instructions (Addendum)
Avoid frequent bending and stooping  No lifting greater than 10 lbs. May use ice or moist heat for pain. Weight loss is of benefit. Handicap license is approved. MRI with contrast of the thoracic and lumbar spine as there renal function is normal and there a question of scar at the L5-S1 level r/o enhancing lesion at L-S.

## 2016-10-25 ENCOUNTER — Encounter: Payer: Self-pay | Admitting: Physician Assistant

## 2016-10-30 ENCOUNTER — Other Ambulatory Visit: Payer: Self-pay | Admitting: Internal Medicine

## 2016-10-31 ENCOUNTER — Telehealth (INDEPENDENT_AMBULATORY_CARE_PROVIDER_SITE_OTHER): Payer: Self-pay | Admitting: Specialist

## 2016-10-31 NOTE — Telephone Encounter (Signed)
refaxed orders to [facility] as requested

## 2016-10-31 NOTE — Telephone Encounter (Signed)
Jill Phillips called needing the MRI orders for the thoracic and lumbar w/contrast faxed over. Fax # [phone]

## 2016-11-01 ENCOUNTER — Encounter: Payer: Medicare HMO | Attending: Physical Medicine & Rehabilitation | Admitting: Registered Nurse

## 2016-11-01 ENCOUNTER — Encounter: Payer: Self-pay | Admitting: Registered Nurse

## 2016-11-01 VITALS — BP 114/70 | HR 104

## 2016-11-01 DIAGNOSIS — Z9981 Dependence on supplemental oxygen: Secondary | ICD-10-CM | POA: Insufficient documentation

## 2016-11-01 DIAGNOSIS — M5136 Other intervertebral disc degeneration, lumbar region: Secondary | ICD-10-CM

## 2016-11-01 DIAGNOSIS — J449 Chronic obstructive pulmonary disease, unspecified: Secondary | ICD-10-CM | POA: Diagnosis not present

## 2016-11-01 DIAGNOSIS — Z5181 Encounter for therapeutic drug level monitoring: Secondary | ICD-10-CM

## 2016-11-01 DIAGNOSIS — M961 Postlaminectomy syndrome, not elsewhere classified: Secondary | ICD-10-CM | POA: Diagnosis not present

## 2016-11-01 DIAGNOSIS — M5126 Other intervertebral disc displacement, lumbar region: Secondary | ICD-10-CM | POA: Diagnosis not present

## 2016-11-01 DIAGNOSIS — M51369 Other intervertebral disc degeneration, lumbar region without mention of lumbar back pain or lower extremity pain: Secondary | ICD-10-CM

## 2016-11-01 DIAGNOSIS — E039 Hypothyroidism, unspecified: Secondary | ICD-10-CM | POA: Insufficient documentation

## 2016-11-01 DIAGNOSIS — M4316 Spondylolisthesis, lumbar region: Secondary | ICD-10-CM

## 2016-11-01 DIAGNOSIS — G2581 Restless legs syndrome: Secondary | ICD-10-CM

## 2016-11-01 DIAGNOSIS — G4709 Other insomnia: Secondary | ICD-10-CM

## 2016-11-01 DIAGNOSIS — Z9889 Other specified postprocedural states: Secondary | ICD-10-CM | POA: Insufficient documentation

## 2016-11-01 DIAGNOSIS — G894 Chronic pain syndrome: Secondary | ICD-10-CM

## 2016-11-01 DIAGNOSIS — G8929 Other chronic pain: Secondary | ICD-10-CM

## 2016-11-01 DIAGNOSIS — G9619 Other disorders of meninges, not elsewhere classified: Secondary | ICD-10-CM | POA: Diagnosis not present

## 2016-11-01 DIAGNOSIS — I10 Essential (primary) hypertension: Secondary | ICD-10-CM | POA: Diagnosis not present

## 2016-11-01 DIAGNOSIS — M546 Pain in thoracic spine: Secondary | ICD-10-CM | POA: Diagnosis not present

## 2016-11-01 DIAGNOSIS — M170 Bilateral primary osteoarthritis of knee: Secondary | ICD-10-CM | POA: Diagnosis not present

## 2016-11-01 DIAGNOSIS — K219 Gastro-esophageal reflux disease without esophagitis: Secondary | ICD-10-CM | POA: Insufficient documentation

## 2016-11-01 DIAGNOSIS — E785 Hyperlipidemia, unspecified: Secondary | ICD-10-CM | POA: Insufficient documentation

## 2016-11-01 DIAGNOSIS — Z720 Tobacco use: Secondary | ICD-10-CM

## 2016-11-01 DIAGNOSIS — Z79899 Other long term (current) drug therapy: Secondary | ICD-10-CM | POA: Diagnosis not present

## 2016-11-01 DIAGNOSIS — M791 Myalgia: Secondary | ICD-10-CM

## 2016-11-01 DIAGNOSIS — E079 Disorder of thyroid, unspecified: Secondary | ICD-10-CM | POA: Insufficient documentation

## 2016-11-01 DIAGNOSIS — M62830 Muscle spasm of back: Secondary | ICD-10-CM

## 2016-11-01 DIAGNOSIS — M7918 Myalgia, other site: Secondary | ICD-10-CM

## 2016-11-01 DIAGNOSIS — M2170 Unequal limb length (acquired), unspecified site: Secondary | ICD-10-CM | POA: Insufficient documentation

## 2016-11-01 MED ORDER — FENTANYL 50 MCG/HR TD PT72
50.0000 ug | MEDICATED_PATCH | TRANSDERMAL | 0 refills | Status: DC
Start: 2016-11-01 — End: 2016-11-28

## 2016-11-01 MED ORDER — HYDROCODONE-ACETAMINOPHEN 10-325 MG PO TABS: 1.0000 | ORAL_TABLET | Freq: Four times a day (QID) | ORAL | 0 refills | Status: DC | PRN

## 2016-11-01 NOTE — Progress Notes (Signed)
Subjective:    Patient ID: Jill Phillips, female    DOB: 08-27-1956, 61 y.o.   MRN: [id number]  HPI: [name] is a 61 year old female who returns for follow up appointment for chronic pain and medication refill. She states her pain is located in her mid-lower back, bilateral hips and rectal pain. States her PCP following her rectal pain. She rates her pain 6.Her current exercise regime is walking and performing stretching exercises.   Pain Inventory Average Pain 6 Pain Right Now 6 My pain is burning, dull and aching  In the last 24 hours, has pain interfered with the following? General activity 6 Relation with others 5 Enjoyment of life 5 What TIME of day is your pain at its worst? day, evening and night Sleep (in general) .  Pain is worse with: walking, sitting, standing and some activites Pain improves with: rest and medication Relief from Meds: 5  Mobility walk without assistance  Function Do you have any goals in this area?  no  Neuro/Psych trouble walking  Prior Studies Any changes since last visit?  no  Physicians involved in your care Any changes since last visit?  no   Family History  Problem Relation Age of Onset  . Kidney disease Mother   . Heart disease Father   . Anuerysm Brother 29    brain  . Heart disease Brother   . Heart disease Sister 98    s/p CABG  . Hypertension Sister    Social History   Social History  . Marital status: Divorced    Spouse name: n/a  . Number of children: 2  . Years of education: 12+   Occupational History  . disability     back surgeries   Social History Main Topics  . Smoking status: Current Every Day Smoker    Packs/day: 1.25    Years: 45.00    Types: Cigarettes  . Smokeless tobacco: Never Used  . Alcohol use No  . Drug use: Yes    Types: Marijuana     Comment: 01/31/2016 "none since ~ 1980"  . Sexual activity: No   Other Topics Concern  . Not on file   Social History Narrative   Lives alone.  One daughter is local, but is getting ready to move to [place], where her children live with their father.  The other daughter lives near [place], [place].   Past Surgical History:  Procedure Laterality Date  . APPENDECTOMY    . BACK SURGERY     18 back surgeries (2 thoracic & 16 lumbar) (01/31/2016)  . DILATION AND CURETTAGE OF UTERUS    . HAMMER TOE SURGERY    . JOINT REPLACEMENT    . KNEE ARTHROSCOPY Right   . LAPAROSCOPIC CHOLECYSTECTOMY    . LUMBAR FUSION N/A 08/01/2015   Procedure: Right sided L1-2 and L2-3 transforaminal lumbar interbody fusion with cages, Extension of posterior fusion T12 to L3, Replaced pedicle screws bilaterally L1-L2 , Replaced left sided pedicle screws L-3. Instrumentation T12 to L3 using local bone graft, Vivigen allograft and cancellous chips;  Surgeon: [name], MD;  Location: [place];  Service: Orthopedics;  Laterality: N/A;  . LUMBAR LAMINECTOMY/DECOMPRESSION MICRODISCECTOMY N/A 01/28/2014   Procedure: Minimally Invasive Right  L1-2 Microdiscectomy;  Surgeon: [name], MD;  Location: [place];  Service: Orthopedics;  Laterality: N/A;  . TONSILLECTOMY    . TOTAL HIP ARTHROPLASTY Right   . TOTAL KNEE ARTHROPLASTY  05/29/2012  Procedure: TOTAL KNEE ARTHROPLASTY;  Surgeon: Mcarthur Rossetti, MD;  Location: [facility];  Service: Orthopedics;  Laterality: Right;  Right Total Knee Arthroplasty  . TUBAL LIGATION    . UVULOPALATOPHARYNGOPLASTY    . VAGINAL HYSTERECTOMY     Past Medical History:  Diagnosis Date  . Active smoker   . Anxiety   . Calcifying tendinitis of shoulder   . Chronic back pain   . Chronic pain syndrome   . COPD (chronic obstructive pulmonary disease) ([place])   . Dysthymic disorder   . Emphysema lung ([place])   . GERD (gastroesophageal reflux disease)   . Headache    "weekly maybe" (01/31/2016)  . Heart murmur    for years, nothing to be concerned about  . Herpes genitalia   . History of blood transfusion 1980   related  to "back surgery"  . Hyperlipidemia   . Hypertension   . Hypothyroidism   . Lumbago   . Osteoarthrosis, unspecified whether generalized or localized, lower leg   . Pain in joint, upper arm   . Pneumothorax, left 01/31/2016   S/P Left posterior subcostal pain injection on 01/30/2016  . PONV (postoperative nausea and vomiting)    gets nauseous  with longer surgery. Difficuty voiding after surgery  . Postlaminectomy syndrome, thoracic region   . Primary localized osteoarthrosis, lower leg   . Restless leg syndrome   . Sleep apnea    s/p surgery- last sleep study 2011- doesnt use oxygen or machine at night as instructed,.   12/2014- Dr Halford Chessman  reports it is negative.  . Thyroid disease    There were no vitals taken for this visit.  Opioid Risk Score:   Fall Risk Score:  `1  Depression screen PHQ 2/9  Depression screen [facility] 2/9 05/15/2016 05/02/2016 12/07/2015 11/15/2015 10/13/2015 09/06/2015 05/22/2015  Decreased Interest 0 0 0 0 0 0 0  Down, Depressed, Hopeless 0 0 0 0 - 0 0  PHQ - 2 Score 0 0 0 0 0 0 0  Altered sleeping - - - - - - -  Tired, decreased energy - - - - - - -  Change in appetite - - - - - - -  Feeling bad or failure about yourself  - - - - - - -  Trouble concentrating - - - - - - -  Moving slowly or fidgety/restless - - - - - - -  Suicidal thoughts - - - - - - -  PHQ-9 Score - - - - - - -  Some recent data might be hidden    Review of Systems  Constitutional: Negative.   HENT: Negative.   Eyes: Negative.   Respiratory: Negative.   Cardiovascular: Negative.   Gastrointestinal: Negative.   Endocrine: Negative.   Genitourinary: Negative.   Musculoskeletal: Positive for gait problem.  Skin: Negative.   Allergic/Immunologic: Negative.   Hematological: Negative.   Psychiatric/Behavioral: Negative.   All other systems reviewed and are negative.      Objective:   Physical Exam  Constitutional: She is oriented to person, place, and time. She appears well-developed and  well-nourished.  HENT:  Head: Normocephalic and atraumatic.  Neck: Normal range of motion. Neck supple.  Cardiovascular: Normal rate and regular rhythm.   Pulmonary/Chest: Effort normal and breath sounds normal.  Musculoskeletal:  Normal Muscle Bulk and Muscle Testing Reveals: Upper Extremities: Full ROM and Muscle Strength 5/5 Thoracic Paraspinal Tenderness: T-7-T-9 Lower Extremities: Full ROM and Muscle Strength 5/5 Arises from Table  with Ease Narrow Based Gait  Neurological: She is alert and oriented to person, place, and time.  Skin: Skin is warm and dry.  Psychiatric: She has a normal mood and affect.  Nursing note and vitals reviewed.         Assessment & Plan:  1. Chronic lumbar spine pain/post-lami syndrome: 11/01/2016 Refilled: Fentanyl 50 MCG one patch every three days #10 and Hydrocodone 10/325 mg one tablet every 6 hours as needed for pain #120. We will continue the opioid monitoring program, this consists of regular clinic visits, examinations, urine drug screen, pill counts as well as use of [place] Controlled Substance Reporting System. 2. Chronic Bilateral Thoracic Pain: Continue current medication regime. 3. Osteoarthritis of the knees bilaterally:No complaints today. Continue with Heat, exercise and voltaren gel. 11/01/2016  4. Rotator cuff syndrome/subacromial bursitis: No complaints voiced today. 11/01/2016 5. Restless legs syndrome: Continue Requip.Continue to monitor. 11/01/2016 6. Tobacco Abuse: Encourage Smoking Cessation: Pulmonologist Following. 11/01/2016 7. Emphysema: Pulmonology Following. 11/01/2016 8. Muscle Spasm: Continue Soma. 11/01/2016 9. Insomnia: Continue Seroquel. 11/01/2016 10. Neuropathic Pain: Continue Gabapentin.11/01/2016  20 minutes of face to face patient care time was spent during this visit. All questions were encouraged and answered.   F/u in 1 month

## 2016-11-02 ENCOUNTER — Ambulatory Visit (HOSPITAL_COMMUNITY): Payer: Medicare HMO

## 2016-11-04 ENCOUNTER — Encounter: Payer: Self-pay | Admitting: Physician Assistant

## 2016-11-04 ENCOUNTER — Ambulatory Visit (INDEPENDENT_AMBULATORY_CARE_PROVIDER_SITE_OTHER): Payer: Medicare HMO | Admitting: Physician Assistant

## 2016-11-04 VITALS — BP 104/78 | HR 76 | Ht 64.0 in | Wt 158.0 lb

## 2016-11-04 DIAGNOSIS — K6289 Other specified diseases of anus and rectum: Secondary | ICD-10-CM

## 2016-11-04 DIAGNOSIS — Z1211 Encounter for screening for malignant neoplasm of colon: Secondary | ICD-10-CM | POA: Diagnosis not present

## 2016-11-04 DIAGNOSIS — Z1212 Encounter for screening for malignant neoplasm of rectum: Secondary | ICD-10-CM

## 2016-11-04 MED ORDER — NA SULFATE-K SULFATE-MG SULF 17.5-3.13-1.6 GM/177ML PO SOLN: 1.0000 | ORAL | 0 refills | Status: DC

## 2016-11-04 NOTE — Progress Notes (Signed)
 Chief Complaint: Rectal Pain  HPI:  Jill Phillips 61-year-old female with a past medical history of anxiety, chronic back pain, COPD, emphysema, GERD and others listed below, who was referred to me by Nche, Charlotte Lum, NP for a complaint of rectal pain.     Patient has been seen by her orthopedist regarding this pain and had an x-ray of the sacrum and coccyx which was normal.   Today, the patient tells me that since January she has been having rectal discomfort. She notes that this is worse when she sits down for a long period of time. She tells me that she just "woke up one day and it was there". She describes this pain as "sharp". The only thing that seems to help it is a hydrocodone and a muscle relaxer together which she takes twice a day. She tells me there is no change with a bowel movement. Though she has had a change in her bowel movements since the beginning of the year towards constipation. Patient tells me that she has just "hard pellets", currently she is using a vegetable laxative to help. Per the patient, this pain has been worked up by her orthopedist who believes it could be her spine, but they want to make sure it's not her colon. She also tells me she has not had a colonoscopy in at least 20 years and is aware that she is due. Patient was prescribed diltiazem gel recently but tells me this is too expensive for her to pick up.   Patient denies fever, chills, change in bowel habits, weight loss, fatigue and anorexia, nausea, vomiting or abdominal pain.  Past Medical History:  Diagnosis Date  . Active smoker   . Anxiety   . Calcifying tendinitis of shoulder   . Chronic back pain   . Chronic pain syndrome   . COPD (chronic obstructive pulmonary disease) (HCC)   . Dysthymic disorder   . Emphysema lung (HCC)   . GERD (gastroesophageal reflux disease)   . Headache    "weekly maybe" (01/31/2016)  . Heart murmur    for years, nothing to be concerned about  . Herpes genitalia   .  History of blood transfusion 1980   related to "back surgery"  . Hyperlipidemia   . Hypertension   . Hypothyroidism   . Lumbago   . Osteoarthrosis, unspecified whether generalized or localized, lower leg   . Pain in joint, upper arm   . Pneumothorax, left 01/31/2016   S/P Left posterior subcostal pain injection on 01/30/2016  . PONV (postoperative nausea and vomiting)    gets nauseous  with longer surgery. Difficuty voiding after surgery  . Postlaminectomy syndrome, thoracic region   . Primary localized osteoarthrosis, lower leg   . Restless leg syndrome   . Sleep apnea    s/p surgery- last sleep study 2011- doesnt use oxygen or machine at night as instructed,.   12/2014- Dr Sood  reports it is negative.  . Thyroid disease     Past Surgical History:  Procedure Laterality Date  . APPENDECTOMY    . BACK SURGERY     18 back surgeries (2 thoracic & 16 lumbar) (01/31/2016)  . DILATION AND CURETTAGE OF UTERUS    . HAMMER TOE SURGERY    . JOINT REPLACEMENT    . KNEE ARTHROSCOPY Right   . LAPAROSCOPIC CHOLECYSTECTOMY    . LUMBAR FUSION N/A 08/01/2015   Procedure: Right sided L1-2 and L2-3 transforaminal lumbar interbody fusion with cages, Extension   of posterior fusion T12 to L3, Replaced pedicle screws bilaterally L1-L2 , Replaced left sided pedicle screws L-3. Instrumentation T12 to L3 using local bone graft, Vivigen allograft and cancellous chips;  Surgeon: [name], MD;  Location: [facility];  Service: Orthopedics;  Laterality: N/A;  . LUMBAR LAMINECTOMY/DECOMPRESSION MICRODISCECTOMY N/A 01/28/2014   Procedure: Minimally Invasive Right  L1-2 Microdiscectomy;  Surgeon: [name], MD;  Location: [facility];  Service: Orthopedics;  Laterality: N/A;  . TONSILLECTOMY    . TOTAL HIP ARTHROPLASTY Right   . TOTAL KNEE ARTHROPLASTY  05/29/2012   Procedure: TOTAL KNEE ARTHROPLASTY;  Surgeon: Mcarthur Rossetti, MD;  Location: [facility];  Service: Orthopedics;  Laterality: Right;  Right Total Knee  Arthroplasty  . TUBAL LIGATION    . UVULOPALATOPHARYNGOPLASTY    . VAGINAL HYSTERECTOMY      Current Outpatient Prescriptions  Medication Sig Dispense Refill  . acetaminophen (TYLENOL) 500 MG tablet Take 1,000 mg by mouth every 6 (six) hours as needed for headache.     . albuterol (PROAIR HFA) 108 (90 Base) MCG/ACT inhaler INHALE 1 PUFF INTO THE LUNGS EVERY 6 HOURS AS NEEDED FOR WHEEZING OR SHORTNESS OF BREATH.NEED OFFICE VISIT FOR FURTHER REFILLS 8.5 g 0  . benazepril (LOTENSIN) 40 MG tablet Take 1 tablet (40 mg total) by mouth daily. 90 tablet 1  . carisoprodol (SOMA) 350 MG tablet Take 1 tablet (350 mg total) by mouth 3 (three) times daily. 90 tablet 3  . clonazePAM (KLONOPIN) 2 MG tablet TAKE 1 TABLET BY MOUTH 2 TIMES A DAY AS NEEDED. 60 tablet 2  . fentaNYL (DURAGESIC - DOSED MCG/HR) 50 MCG/HR Place 1 patch (50 mcg total) onto the skin every 3 (three) days. 10 patch 0  . fluticasone (FLONASE) 50 MCG/ACT nasal spray Place 1 spray into both nostrils daily. (Patient taking differently: Place 1 spray into both nostrils daily as needed for allergies. ) 16 g 5  . gabapentin (NEURONTIN) 300 MG capsule TAKE 1 CAPSULE BY MOUTH 2 TIMES DAILY. 60 capsule 5  . hydrochlorothiazide (HYDRODIURIL) 12.5 MG tablet Take 1 tablet (12.5 mg total) by mouth daily. 90 tablet 1  . HYDROcodone-acetaminophen (NORCO) 10-325 MG tablet Take 1 tablet by mouth every 6 (six) hours as needed (breakthrough pain). 120 tablet 0  . levothyroxine (SYNTHROID, LEVOTHROID) 75 MCG tablet Take 1 tablet (75 mcg total) by mouth daily before breakfast. 30 tablet 11  . MOVANTIK 25 MG TABS tablet     . naproxen (NAPROSYN) 500 MG tablet TAKE 1 TABLET BY MOUTH TWICE A DAY WITH FOOD. 60 tablet 5  . nicotine (NICODERM CQ - DOSED IN MG/24 HOURS) 21 mg/24hr patch Place 1 patch (21 mg total) onto the skin daily. 28 patch 0  . omeprazole (PRILOSEC) 20 MG capsule TAKE 1 CAPSULE BY MOUTH 2 TIMES DAILY BEFORE A MEAL. 60 capsule 5  . pravastatin  (PRAVACHOL) 40 MG tablet TAKE 1 TABLET BY MOUTH DAILY. 90 tablet PRN  . Psyllium (VEGETABLE LAXATIVE PO) Take 2 tablets by mouth at bedtime. Reported on 10/24/2015    . QUEtiapine (SEROQUEL) 100 MG tablet TAKE 1 TABLET BY MOUTH AT BEDTIME. 30 tablet 2  . rOPINIRole (REQUIP) 0.5 MG tablet TAKE 2 TABLETS BY MOUTH AT BEDTIME. 60 tablet 1  . SPIRIVA HANDIHALER 18 MCG inhalation capsule PLACE 1 CAPSULE INTO INHALER AND INHALE DAILY. 30 capsule 2  . tiZANidine (ZANAFLEX) 4 MG tablet TAKE 1 TABLET BY MOUTH EVERY 8 HOURS AS NEEDED FOR MUSCLE PAIN/ SPASMS  90 tablet 3  . valACYclovir (VALTREX) 500 MG tablet TAKE 1 TABLET BY MOUTH DAILY. 30 tablet 11  . venlafaxine XR (EFFEXOR-XR) 150 MG 24 hr capsule Take 2 capsules (300 mg total) by mouth daily. 60 capsule 5  . VOLTAREN 1 % GEL APPLY 2 GRAMS TO AFFECTED AREA 3 TIMES A DAY AS NEEDED FOR PAIN. 300 g 4  . amLODipine (NORVASC) 10 MG tablet Take 1 tablet (10 mg total) by mouth daily. 30 tablet 3  . Na Sulfate-K Sulfate-Mg Sulf 17.5-3.13-1.6 GM/180ML SOLN Take 1 kit by mouth as directed. 354 mL 0   No current facility-administered medications for this visit.     Allergies as of 11/04/2016 - Review Complete 11/04/2016  Allergen Reaction Noted  . Flagyl [metronidazole] Diarrhea 03/02/2013  . Sulfa antibiotics Other (See Comments) 09/23/2011  . Amoxicillin Other (See Comments) 09/23/2011  . Chlorzoxazone Other (See Comments) 09/23/2011  . Codeine Other (See Comments) 09/23/2011  . Darvocet [propoxyphene n-acetaminophen] Itching 09/23/2011  . Dilaudid [hydromorphone hcl] Itching 09/23/2011  . Keflex [cephalexin] Other (See Comments) 02/17/2013  . Morphine and related Itching 09/23/2011  . Nitrofurantoin monohyd macro Hives 09/23/2011  . Percocet [oxycodone-acetaminophen] Itching 09/23/2011    Family History  Problem Relation Age of Onset  . Kidney disease Mother   . Heart disease Father   . Anuerysm Brother 29    brain  . Heart disease Brother   .  Heart disease Sister 85    s/p CABG  . Hypertension Sister     Social History   Social History  . Marital status: Divorced    Spouse name: n/a  . Number of children: 2  . Years of education: 12+   Occupational History  . disability     back surgeries   Social History Main Topics  . Smoking status: Current Every Day Smoker    Packs/day: 1.25    Years: 45.00    Types: Cigarettes  . Smokeless tobacco: Never Used  . Alcohol use No  . Drug use: Yes    Types: Marijuana     Comment: 01/31/2016 "none since ~ 1980"  . Sexual activity: No   Other Topics Concern  . Not on file   Social History Narrative   Lives alone.  One daughter is local, but is getting ready to move to [place], where her children live with their father.  The other daughter lives near [place], [place].    Review of Systems:    Constitutional: No weight loss, fever or chills Skin: No rash  Cardiovascular: Positive for heart murmur Respiratory: Positive for chronic shortness of breath Gastrointestinal: See HPI and otherwise negative Genitourinary: No dysuria or change in urinary frequency Neurological: No headache, dizziness or syncope Musculoskeletal: Positive for chronic back pain and muscle pain Hematologic: No bleeding or bruising Psychiatric: Positive for history of "very bad" anxiety   Physical Exam:  Vital signs: BP 104/78   Pulse 76   Ht 5' 4" (1.626 m)   Wt 158 lb (71.7 kg)   SpO2 98%   BMI 27.12 kg/m   Constitutional:   Pleasant Caucasian female appears to be in NAD, Well developed, Well nourished, alert and cooperative Head:  Normocephalic and atraumatic. Eyes:   PEERL, EOMI. No icterus. Conjunctiva pink. Ears:  Normal auditory acuity. Neck:  Supple Throat: Oral cavity and pharynx without inflammation, swelling or lesion.  Respiratory: Respirations even and unlabored. Lungs clear to auscultation bilaterally.   No wheezes, crackles, or rhonchi.  Cardiovascular: Normal S1,  S2. No MRG.  Regular rate and rhythm. No peripheral edema, cyanosis or pallor.  Gastrointestinal:  Soft, nondistended, nontender. No rebound or guarding. Normal bowel sounds. No appreciable masses or hepatomegaly. Rectal:  External exam: ? Fissure, no reducible ttp; Internal exam: no ttp, no discharge Msk:  Symmetrical without gross deformities. Without edema, no deformity or joint abnormality.  Neurologic:  Alert and  oriented x4;  grossly normal neurologically.  Skin:   Dry and intact without significant lesions or rashes. Psychiatric:  Demonstrates good judgement and reason without abnormal affect or behaviors.  MOST RECENT LABS:  CBC    Component Value Date/Time   WBC 9.7 10/01/2016 1511   RBC 3.95 10/01/2016 1511   HGB 12.7 10/01/2016 1511   HCT 37.7 10/01/2016 1511   HCT 34.5 05/22/2015 1552   PLT 263.0 10/01/2016 1511   PLT 257 05/22/2015 1552   MCV 95.5 10/01/2016 1511   MCV 93 05/22/2015 1552   MCH 31.2 06/07/2016 1900   MCHC 33.8 10/01/2016 1511   RDW 14.9 10/01/2016 1511   RDW 14.1 05/22/2015 1552   LYMPHSABS 2.7 10/01/2016 1511   MONOABS 0.6 10/01/2016 1511   EOSABS 0.1 10/01/2016 1511   BASOSABS 0.1 10/01/2016 1511    CMP     Component Value Date/Time   NA 145 10/01/2016 1511   NA 148 (H) 05/22/2015 1552   K 3.8 10/01/2016 1511   CL 108 10/01/2016 1511   CO2 34 (H) 10/01/2016 1511   GLUCOSE 92 10/01/2016 1511   BUN 17 10/01/2016 1511   BUN 17 05/22/2015 1552   CREATININE 1.03 10/01/2016 1511   CREATININE 1.13 (H) 02/15/2015 1506   CALCIUM 9.5 10/01/2016 1511   PROT 7.2 10/01/2016 1511   PROT 7.1 05/22/2015 1552   ALBUMIN 4.2 10/01/2016 1511   ALBUMIN 4.4 05/22/2015 1552   AST 15 10/01/2016 1511   ALT 10 10/01/2016 1511   ALKPHOS 119 (H) 10/01/2016 1511   BILITOT 0.3 10/01/2016 1511   BILITOT <0.2 05/22/2015 1552   GFRNONAA 48 (L) 06/07/2016 1900   GFRNONAA 53 (L) 02/15/2015 1506   GFRAA 56 (L) 06/07/2016 1900   GFRAA 61 02/15/2015 1506    Assessment: 1.  Rectal pain: Question if the fissure seen at time of exam is the explanation for patient's rectal pain she was not tender in this area, no help from anusol suppositories in the past, patient tells me insurance does not pay for diltiazem; question also relation to possible orthopedic process 2. Screening for colorectal cancer: It is at least 20 years since patient's last colonoscopy  Plan: 1. Scheduled patient for a colonoscopy for further evaluation of rectal pain under anesthesia as well as for screening purposes. Discussed risks, benefits, limitations and alternatives the patient agrees to proceed. This was scheduled with Dr. Fuller Plan, as he is the supervising physician this afternoon, in the [facility]. 2. Recommend sitz bath 3 times a day. 3. Recommend Miralax qd, did discuss that she can increase this to 4 times a day if needed. 4. Patient to follow in clinic per Dr. Lynne Leader recommendations after time of procedure.  Ellouise Newer, PA-C [facility] 11/04/2016, 3:14 PM  Cc: Flossie Buffy, NP

## 2016-11-04 NOTE — Patient Instructions (Addendum)
You have been scheduled for a colonoscopy. Please follow written instructions given to you at your visit today.  Please pick up your prep supplies at the pharmacy within the next 1-3 days. If you use inhalers (even only as needed), please bring them with you on the day of your procedure. Your physician has requested that you go to www.startemmi.com and enter the access code given to you at your visit today. This web site gives a general overview about your procedure. However, you should still follow specific instructions given to you by our office regarding your preparation for the procedure.  Please purchase the following medications over the counter and take as directed: Reticare cream three times a day.   We have given you a handout on sitz baths.

## 2016-11-05 NOTE — Progress Notes (Signed)
Reviewed and agree with management plan.  Hatley Henegar T. Ariela Mochizuki, MD FACG 

## 2016-11-07 ENCOUNTER — Other Ambulatory Visit: Payer: Self-pay | Admitting: Physical Medicine & Rehabilitation

## 2016-11-07 DIAGNOSIS — R0781 Pleurodynia: Secondary | ICD-10-CM | POA: Diagnosis not present

## 2016-11-07 DIAGNOSIS — M549 Dorsalgia, unspecified: Secondary | ICD-10-CM | POA: Diagnosis not present

## 2016-11-07 DIAGNOSIS — Z981 Arthrodesis status: Secondary | ICD-10-CM | POA: Diagnosis not present

## 2016-11-07 DIAGNOSIS — R600 Localized edema: Secondary | ICD-10-CM | POA: Diagnosis not present

## 2016-11-07 DIAGNOSIS — M546 Pain in thoracic spine: Secondary | ICD-10-CM | POA: Diagnosis not present

## 2016-11-07 DIAGNOSIS — K6289 Other specified diseases of anus and rectum: Secondary | ICD-10-CM | POA: Diagnosis not present

## 2016-11-12 ENCOUNTER — Ambulatory Visit: Payer: Medicare Other | Admitting: Pulmonary Disease

## 2016-11-12 ENCOUNTER — Other Ambulatory Visit: Payer: Self-pay | Admitting: Physical Medicine & Rehabilitation

## 2016-11-12 ENCOUNTER — Other Ambulatory Visit: Payer: Self-pay | Admitting: Internal Medicine

## 2016-11-14 ENCOUNTER — Other Ambulatory Visit: Payer: Self-pay | Admitting: Emergency Medicine

## 2016-11-14 ENCOUNTER — Other Ambulatory Visit: Payer: Self-pay | Admitting: Physical Medicine & Rehabilitation

## 2016-11-14 ENCOUNTER — Encounter (INDEPENDENT_AMBULATORY_CARE_PROVIDER_SITE_OTHER): Payer: Self-pay | Admitting: Specialist

## 2016-11-14 ENCOUNTER — Ambulatory Visit (INDEPENDENT_AMBULATORY_CARE_PROVIDER_SITE_OTHER): Payer: Medicare HMO | Admitting: Specialist

## 2016-11-14 VITALS — BP 143/88 | HR 98 | Ht 64.0 in | Wt 159.0 lb

## 2016-11-14 DIAGNOSIS — K6289 Other specified diseases of anus and rectum: Secondary | ICD-10-CM

## 2016-11-14 DIAGNOSIS — M960 Pseudarthrosis after fusion or arthrodesis: Secondary | ICD-10-CM | POA: Diagnosis not present

## 2016-11-14 DIAGNOSIS — M4325 Fusion of spine, thoracolumbar region: Secondary | ICD-10-CM | POA: Diagnosis not present

## 2016-11-14 DIAGNOSIS — I10 Essential (primary) hypertension: Secondary | ICD-10-CM

## 2016-11-14 MED ORDER — AMLODIPINE BESYLATE 10 MG PO TABS: 10.0000 mg | ORAL_TABLET | Freq: Every day | ORAL | 0 refills | Status: DC

## 2016-11-14 NOTE — Patient Instructions (Signed)
Avoid frequent bending and stooping  No lifting greater than 10 lbs. May use ice or moist heat for pain. Weight loss is of benefit. Handicap license is approved.   

## 2016-11-14 NOTE — Progress Notes (Signed)
Office Visit Note   Patient: Jill Phillips           Date of Birth: 1956/08/08           MRN: [id number] Visit Date: 11/14/2016              Requested by: [name], MD [place], [place] [address] PCP: [name], MD   Assessment & Plan: Visit Diagnoses:  1. Proctalgia   2. Pseudarthrosis following spinal fusion   3. Fusion of spine of thoracolumbar region     Plan: Avoid frequent bending and stooping  No lifting greater than 10 lbs. May use ice or moist heat for pain. Weight loss is of benefit. Handicap license is approved.     Follow-Up Instructions: Return in about 4 weeks (around 12/12/2016).   Orders:  No orders of the defined types were placed in this encounter.  No orders of the defined types were placed in this encounter.     Procedures: No procedures performed   Clinical Data: No additional findings.   Subjective: Chief Complaint  Patient presents with  . Middle Back - Follow-up  . Lower Back - Follow-up    Jill Phillips is here to review MRI's of her Thoracic and Lumbar Spines.  She states that there has not been any changes in her symptoms since her last office visits.    Review of Systems  Constitutional: Negative.   HENT: Negative.   Eyes: Negative.   Respiratory: Negative.   Cardiovascular: Negative.   Gastrointestinal: Negative.   Endocrine: Negative.   Genitourinary: Negative.   Musculoskeletal: Negative.   Skin: Negative.   Allergic/Immunologic: Negative.   Neurological: Negative.   Hematological: Negative.   Psychiatric/Behavioral: Negative.      Objective: Vital Signs: BP (!) 143/88 (BP Location: Left Arm, Patient Position: Sitting)   Pulse 98   Ht 5\' 4"  (1.626 m)   Wt 159 lb (72.1 kg)   BMI 27.29 kg/m   Physical Exam  Constitutional: She is oriented to person, place, and time. She appears well-developed and well-nourished.  HENT:  Head: Normocephalic and atraumatic.  Eyes: EOM are normal. Pupils  are equal, round, and reactive to light.  Neck: Normal range of motion. Neck supple.  Pulmonary/Chest: Effort normal and breath sounds normal.  Abdominal: Soft. Bowel sounds are normal.  Neurological: She is alert and oriented to person, place, and time.  Skin: Skin is warm and dry.  Psychiatric: She has a normal mood and affect. Her behavior is normal. Judgment and thought content normal.    Back Exam   Tenderness  The patient is experiencing tenderness in the lumbar and thoracic.  Range of Motion  Extension: abnormal  Flexion: abnormal  Lateral Bend Right: abnormal  Lateral Bend Left: abnormal  Rotation Left: abnormal   Muscle Strength  Right Quadriceps:  5/5  Left Quadriceps:  5/5  Right Hamstrings:  5/5  Left Hamstrings:  5/5   Tests  Straight leg raise right: negative Straight leg raise left: negative  Reflexes  Patellar: abnormal Achilles: normal Babinski's sign: normal   Other  Toe Walk: normal Heel Walk: normal Sensation: normal Erythema: no back redness Scars: absent      Specialty Comments:  No specialty comments available.  Imaging: No results found.   PMFS History: Patient Active Problem List   Diagnosis Date Noted  . Degenerative disc disease, lumbar 08/01/2015    Priority: High    Class: Chronic  .  Spondylolisthesis of lumbar region 08/01/2015    Priority: High    Class: Chronic  . CKD (chronic kidney disease) stage 3, GFR 30-59 ml/min 08/07/2016  . GERD (gastroesophageal reflux disease) 08/07/2016  . Prediabetes 08/07/2016  . Chronic respiratory failure ([place]) 07/29/2016  . Hypersomnia 07/29/2016  . Arthritis of carpometacarpal [facility]) joint of left thumb 07/09/2016  . Lump of axilla 05/02/2016  . Pneumothorax on left 01/31/2016  . Chronic, continuous use of opioids 09/06/2015  . Urinary, incontinence, stress female 05/06/2014  . Constipation 05/05/2014  . 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 HTN (hypertension) 05/19/2013  . HLD  (hyperlipidemia) 05/19/2013  . Depression 05/19/2013  . Hypothyroidism 05/19/2013  . Tobacco abuse   . Osteoarthritis of both knees 11/18/2011  . RLS (restless legs syndrome) 11/18/2011   Past Medical History:  Diagnosis Date  . Active smoker   . Anxiety   . Calcifying tendinitis of shoulder   . Chronic back pain   . Chronic pain syndrome   . COPD (chronic obstructive pulmonary disease) ([place])   . Dysthymic disorder   . Emphysema lung ([place])   . GERD (gastroesophageal reflux disease)   . Headache    "weekly maybe" (01/31/2016)  . Heart murmur    for years, nothing to be concerned about  . Herpes genitalia   . History of blood transfusion 1980   related to "back surgery"  . Hyperlipidemia   . Hypertension   . Hypothyroidism   . Lumbago   . Osteoarthrosis, unspecified whether generalized or localized, lower leg   . Pain in joint, upper arm   . Pneumothorax, left 01/31/2016   S/P Left posterior subcostal pain injection on 01/30/2016  . PONV (postoperative nausea and vomiting)    gets nauseous  with longer surgery. Difficuty voiding after surgery  . Postlaminectomy syndrome, thoracic region   . Primary localized osteoarthrosis, lower leg   . Restless leg syndrome   . Sleep apnea    s/p surgery- last sleep study 2011- doesnt use oxygen or machine at night as instructed,.   12/2014- Dr Halford Chessman  reports it is negative.  . Thyroid disease     Family History  Problem Relation Age of Onset  . Kidney disease Mother   . Heart disease Father   . Anuerysm Brother 29    brain  . Heart disease Brother   . Heart disease Sister 29    s/p CABG  . Hypertension Sister     Past Surgical History:  Procedure Laterality Date  . APPENDECTOMY    . BACK SURGERY     18 back surgeries (2 thoracic & 16 lumbar) (01/31/2016)  . DILATION AND CURETTAGE OF UTERUS    . HAMMER TOE SURGERY    . JOINT REPLACEMENT    . KNEE ARTHROSCOPY Right   . LAPAROSCOPIC CHOLECYSTECTOMY    . LUMBAR FUSION N/A  08/01/2015   Procedure: Right sided L1-2 and L2-3 transforaminal lumbar interbody fusion with cages, Extension of posterior fusion T12 to L3, Replaced pedicle screws bilaterally L1-L2 , Replaced left sided pedicle screws L-3. Instrumentation T12 to L3 using local bone graft, Vivigen allograft and cancellous chips;  Surgeon: [name], MD;  Location: [facility] [place] [facility];  Service: Orthopedics;  Laterality: N/A;  . LUMBAR LAMINECTOMY/DECOMPRESSION MICRODISCECTOMY N/A 01/28/2014   Procedure: Minimally Invasive Right  L1-2 Microdiscectomy;  Surgeon: [name], MD;  Location: [facility];  Service: Orthopedics;  Laterality: N/A;  . TONSILLECTOMY    . TOTAL HIP ARTHROPLASTY Right   .  TOTAL KNEE ARTHROPLASTY  05/29/2012   Procedure: TOTAL KNEE ARTHROPLASTY;  Surgeon: Mcarthur Rossetti, MD;  Location: [facility];  Service: Orthopedics;  Laterality: Right;  Right Total Knee Arthroplasty  . TUBAL LIGATION    . UVULOPALATOPHARYNGOPLASTY    . VAGINAL HYSTERECTOMY     Social History   Occupational History  . disability     back surgeries   Social History Main Topics  . Smoking status: Current Every Day Smoker    Packs/day: 1.25    Years: 45.00    Types: Cigarettes  . Smokeless tobacco: Never Used  . Alcohol use No  . Drug use: Yes    Types: Marijuana     Comment: 01/31/2016 "none since ~ 1980"  . Sexual activity: No

## 2016-11-15 ENCOUNTER — Other Ambulatory Visit: Payer: Self-pay | Admitting: Internal Medicine

## 2016-11-15 DIAGNOSIS — E038 Other specified hypothyroidism: Secondary | ICD-10-CM

## 2016-11-19 ENCOUNTER — Other Ambulatory Visit: Payer: Self-pay | Admitting: Emergency Medicine

## 2016-11-19 DIAGNOSIS — E038 Other specified hypothyroidism: Secondary | ICD-10-CM

## 2016-11-19 MED ORDER — LEVOTHYROXINE SODIUM 75 MCG PO TABS: 75.0000 ug | ORAL_TABLET | Freq: Every day | ORAL | 5 refills | Status: DC

## 2016-11-22 ENCOUNTER — Ambulatory Visit (AMBULATORY_SURGERY_CENTER): Payer: Medicare HMO | Admitting: Gastroenterology

## 2016-11-22 ENCOUNTER — Encounter: Payer: Self-pay | Admitting: Physical Medicine & Rehabilitation

## 2016-11-22 ENCOUNTER — Encounter: Payer: Self-pay | Admitting: Gastroenterology

## 2016-11-22 VITALS — BP 105/75 | HR 78 | Temp 98.0°F | Resp 16 | Ht 64.0 in | Wt 158.0 lb

## 2016-11-22 DIAGNOSIS — Z1212 Encounter for screening for malignant neoplasm of rectum: Secondary | ICD-10-CM

## 2016-11-22 DIAGNOSIS — Z1211 Encounter for screening for malignant neoplasm of colon: Secondary | ICD-10-CM

## 2016-11-22 DIAGNOSIS — K6289 Other specified diseases of anus and rectum: Secondary | ICD-10-CM | POA: Diagnosis not present

## 2016-11-22 DIAGNOSIS — J449 Chronic obstructive pulmonary disease, unspecified: Secondary | ICD-10-CM | POA: Diagnosis not present

## 2016-11-22 DIAGNOSIS — I10 Essential (primary) hypertension: Secondary | ICD-10-CM | POA: Diagnosis not present

## 2016-11-22 MED ORDER — SODIUM CHLORIDE 0.9 % IV SOLN: 500.0000 mL | INTRAVENOUS | Status: DC

## 2016-11-22 NOTE — Progress Notes (Signed)
Patient awakening,vss,report to rn 

## 2016-11-22 NOTE — Patient Instructions (Signed)
YOU HAD AN ENDOSCOPIC PROCEDURE TODAY AT [facility]:   Refer to the procedure report that was given to you for any specific questions about what was found during the examination.  If the procedure report does not answer your questions, please call your gastroenterologist to clarify.  If you requested that your care partner not be given the details of your procedure findings, then the procedure report has been included in a sealed envelope for you to review at your convenience later.  YOU SHOULD EXPECT: Some feelings of bloating in the abdomen. Passage of more gas than usual.  Walking can help get rid of the air that was put into your GI tract during the procedure and reduce the bloating. If you had a lower endoscopy (such as a colonoscopy or flexible sigmoidoscopy) you may notice spotting of blood in your stool or on the toilet paper. If you underwent a bowel prep for your procedure, you may not have a normal bowel movement for a few days.  Please Note:  You might notice some irritation and congestion in your nose or some drainage.  This is from the oxygen used during your procedure.  There is no need for concern and it should clear up in a day or so.  SYMPTOMS TO REPORT IMMEDIATELY:   Following lower endoscopy (colonoscopy or flexible sigmoidoscopy):  Excessive amounts of blood in the stool  Significant tenderness or worsening of abdominal pains  Swelling of the abdomen that is new, acute  Fever of 100F or higher  For urgent or emergent issues, a gastroenterologist can be reached at any hour by calling [phone].  DIET:  We do recommend a small meal at first, but then you may proceed to your regular diet.  Drink plenty of fluids but you should avoid alcoholic beverages for 24 hours.  ACTIVITY:  You should plan to take it easy for the rest of today and you should NOT DRIVE or use heavy machinery until tomorrow (because of the sedation medicines used during the test).     FOLLOW UP: Our staff will call the number listed on your records the next business day following your procedure to check on you and address any questions or concerns that you may have regarding the information given to you following your procedure. If we do not reach you, we will leave a message.  However, if you are feeling well and you are not experiencing any problems, there is no need to return our call.  We will assume that you have returned to your regular daily activities without incident.  SIGNATURES/CONFIDENTIALITY: You and/or your care partner have signed paperwork which will be entered into your electronic medical record.  These signatures attest to the fact that that the information above on your After Visit Summary has been reviewed and is understood.  Full responsibility of the confidentiality of this discharge information lies with you and/or your care-partner.  Next colonoscopy-3 years  Please take Miralax over the counter- may take daily, twice daily, or three times daily- please titrate for adequate bowel habits  Continue your normal medications

## 2016-11-22 NOTE — Progress Notes (Signed)
Pt's states no medical or surgical changes since previsit or office visit. 

## 2016-11-22 NOTE — Op Note (Signed)
[place] Patient Name: Jill Phillips Procedure Date: 11/22/2016 11:14 AM MRN: [id number] Endoscopist: Ladene Artist , MD Age: 61 Referring MD:  Date of Birth: 1955-12-19 Gender: Female Account #: [id number] Procedure:                Colonoscopy Indications:              Screening for colorectal malignant neoplasm Medicines:                Monitored Anesthesia Care Procedure:                Pre-Anesthesia Assessment:                           - Prior to the procedure, a History and Physical                            was performed, and patient medications and                            allergies were reviewed. The patient's tolerance of                            previous anesthesia was also reviewed. The risks                            and benefits of the procedure and the sedation                            options and risks were discussed with the patient.                            All questions were answered, and informed consent                            was obtained. Prior Anticoagulants: The patient has                            taken no previous anticoagulant or antiplatelet                            agents. ASA Grade Assessment: III - A patient with                            severe systemic disease. After reviewing the risks                            and benefits, the patient was deemed in                            satisfactory condition to undergo the procedure.                           After obtaining informed consent, the colonoscope  was passed under direct vision. Throughout the                            procedure, the patient's blood pressure, pulse, and                            oxygen saturations were monitored continuously. The                            Colonoscope was introduced through the anus and                            advanced to the the cecum, identified by                            appendiceal orifice and  ileocecal valve. The                            ileocecal valve, appendiceal orifice, and rectum                            were photographed. The quality of the bowel                            preparation was adequate to identify polyps 6 mm                            and larger in size. The colonoscopy was performed                            without difficulty. The patient tolerated the                            procedure well. Scope In: 11:22:39 AM Scope Out: 11:40:53 AM Scope Withdrawal Time: 0 hours 12 minutes 3 seconds  Total Procedure Duration: 0 hours 18 minutes 14 seconds  Findings:                 The perianal and digital rectal examinations were                            normal.                           The entire examined colon appeared normal however                            the prep was suboptimal. Unable to retroflex in                            rectum due to a narrow rectal vault. Complications:            No immediate complications. Estimated blood loss:  None. Estimated Blood Loss:     Estimated blood loss: none. Impression:               - The entire examined colon is normal.                           - No specimens collected. Recommendation:           - Repeat colonoscopy in 3 years for screening                            purposes with a more extensive bowel prep. Prep                            today was suboptimal.                           - Patient has a contact number available for                            emergencies. The signs and symptoms of potential                            delayed complications were discussed with the                            patient. Return to normal activities tomorrow.                            Written discharge instructions were provided to the                            patient.                           - Resume previous diet.                           - Continue present medications.                            - Miralax qd, bid or tid - titrated for adequate                            bowel movements.                           - No colorectal pathology to explain sacral area                            pain. Ladene Artist, MD 11/22/2016 11:49:05 AM This report has been signed electronically.

## 2016-11-22 NOTE — Telephone Encounter (Signed)
Patient called office and states she will be short one patch appt is 04.02 and she only has enough to make until 04.01--  Please call [phone]

## 2016-11-22 NOTE — Progress Notes (Signed)
Pt states she is having some abdominal discomfort at DC.  Abdomen soft and she has passed large amt of air and some liquid stool.  She states she had abdominal pain before coming into the [place]  I told her to ambulate, drink warm fluids and sit on toilet if she has cramping  Understanding voiced

## 2016-11-25 ENCOUNTER — Telehealth: Payer: Self-pay | Admitting: *Deleted

## 2016-11-25 NOTE — Telephone Encounter (Signed)
  Follow up Call-  Call back number 11/22/2016 07/06/2014  Post procedure Call Back phone  # [phone]  Permission to leave phone message Yes -  Some recent data might be hidden     Patient questions:  Do you have a fever, pain , or abdominal swelling? Yes.   Pain Score  0 * See below Have you tolerated food without any problems? Yes.    Have you been able to return to your normal activities? Yes.    Do you have any questions about your discharge instructions: Diet   No. Medications  No. Follow up visit  No.  Do you have questions or concerns about your Care? No.  Actions: * If pain score is 4 or above: No action needed, pain <4.  Pt is having some lower abdominal cramping... Able to pass air and eat without difficulty.  I told her drink warm fluids and take Tylenol PRN if cramping continues.  Told to call back if cramping persists and understanding voiced.

## 2016-11-26 ENCOUNTER — Telehealth (INDEPENDENT_AMBULATORY_CARE_PROVIDER_SITE_OTHER): Payer: Self-pay | Admitting: Specialist

## 2016-11-26 NOTE — Telephone Encounter (Signed)
Patient called asking about her MRI results. CB # [phone]

## 2016-11-26 NOTE — Telephone Encounter (Signed)
I called and spoke with patient --she states that there was something on the MRI that Dr. [name] was going to call and speak with the radiologist about on scan, she is calling to see what Dr. [name] found out about this area.  She said that it thought to be fat on her back.

## 2016-11-27 ENCOUNTER — Other Ambulatory Visit: Payer: Self-pay | Admitting: Registered Nurse

## 2016-11-28 ENCOUNTER — Encounter: Payer: Self-pay | Admitting: Registered Nurse

## 2016-11-28 ENCOUNTER — Encounter: Payer: Medicare HMO | Attending: Registered Nurse | Admitting: Registered Nurse

## 2016-11-28 VITALS — BP 114/77 | HR 84 | Resp 14

## 2016-11-28 DIAGNOSIS — Z841 Family history of disorders of kidney and ureter: Secondary | ICD-10-CM | POA: Diagnosis not present

## 2016-11-28 DIAGNOSIS — Z720 Tobacco use: Secondary | ICD-10-CM | POA: Diagnosis not present

## 2016-11-28 DIAGNOSIS — Z79899 Other long term (current) drug therapy: Secondary | ICD-10-CM | POA: Diagnosis not present

## 2016-11-28 DIAGNOSIS — F17210 Nicotine dependence, cigarettes, uncomplicated: Secondary | ICD-10-CM | POA: Insufficient documentation

## 2016-11-28 DIAGNOSIS — Z79891 Long term (current) use of opiate analgesic: Secondary | ICD-10-CM | POA: Diagnosis not present

## 2016-11-28 DIAGNOSIS — I10 Essential (primary) hypertension: Secondary | ICD-10-CM | POA: Insufficient documentation

## 2016-11-28 DIAGNOSIS — Z8619 Personal history of other infectious and parasitic diseases: Secondary | ICD-10-CM | POA: Diagnosis not present

## 2016-11-28 DIAGNOSIS — M961 Postlaminectomy syndrome, not elsewhere classified: Secondary | ICD-10-CM

## 2016-11-28 DIAGNOSIS — G894 Chronic pain syndrome: Secondary | ICD-10-CM

## 2016-11-28 DIAGNOSIS — E785 Hyperlipidemia, unspecified: Secondary | ICD-10-CM | POA: Diagnosis not present

## 2016-11-28 DIAGNOSIS — Z96651 Presence of right artificial knee joint: Secondary | ICD-10-CM | POA: Insufficient documentation

## 2016-11-28 DIAGNOSIS — E039 Hypothyroidism, unspecified: Secondary | ICD-10-CM | POA: Diagnosis not present

## 2016-11-28 DIAGNOSIS — R011 Cardiac murmur, unspecified: Secondary | ICD-10-CM | POA: Diagnosis not present

## 2016-11-28 DIAGNOSIS — Z5181 Encounter for therapeutic drug level monitoring: Secondary | ICD-10-CM | POA: Diagnosis not present

## 2016-11-28 DIAGNOSIS — G2581 Restless legs syndrome: Secondary | ICD-10-CM | POA: Diagnosis not present

## 2016-11-28 DIAGNOSIS — M5136 Other intervertebral disc degeneration, lumbar region: Secondary | ICD-10-CM | POA: Diagnosis not present

## 2016-11-28 DIAGNOSIS — G8929 Other chronic pain: Secondary | ICD-10-CM

## 2016-11-28 DIAGNOSIS — M791 Myalgia: Secondary | ICD-10-CM

## 2016-11-28 DIAGNOSIS — M170 Bilateral primary osteoarthritis of knee: Secondary | ICD-10-CM | POA: Insufficient documentation

## 2016-11-28 DIAGNOSIS — K219 Gastro-esophageal reflux disease without esophagitis: Secondary | ICD-10-CM | POA: Diagnosis not present

## 2016-11-28 DIAGNOSIS — M546 Pain in thoracic spine: Secondary | ICD-10-CM | POA: Insufficient documentation

## 2016-11-28 DIAGNOSIS — M7918 Myalgia, other site: Secondary | ICD-10-CM

## 2016-11-28 DIAGNOSIS — G4730 Sleep apnea, unspecified: Secondary | ICD-10-CM | POA: Insufficient documentation

## 2016-11-28 DIAGNOSIS — Z981 Arthrodesis status: Secondary | ICD-10-CM | POA: Diagnosis not present

## 2016-11-28 DIAGNOSIS — J449 Chronic obstructive pulmonary disease, unspecified: Secondary | ICD-10-CM | POA: Insufficient documentation

## 2016-11-28 DIAGNOSIS — M4316 Spondylolisthesis, lumbar region: Secondary | ICD-10-CM | POA: Diagnosis not present

## 2016-11-28 DIAGNOSIS — M51369 Other intervertebral disc degeneration, lumbar region without mention of lumbar back pain or lower extremity pain: Secondary | ICD-10-CM

## 2016-11-28 DIAGNOSIS — Z8249 Family history of ischemic heart disease and other diseases of the circulatory system: Secondary | ICD-10-CM | POA: Insufficient documentation

## 2016-11-28 DIAGNOSIS — Z96641 Presence of right artificial hip joint: Secondary | ICD-10-CM | POA: Insufficient documentation

## 2016-11-28 DIAGNOSIS — Z9049 Acquired absence of other specified parts of digestive tract: Secondary | ICD-10-CM | POA: Insufficient documentation

## 2016-11-28 DIAGNOSIS — Z820 Family history of epilepsy and other diseases of the nervous system: Secondary | ICD-10-CM | POA: Insufficient documentation

## 2016-11-28 MED ORDER — FENTANYL 50 MCG/HR TD PT72: 50.0000 ug | MEDICATED_PATCH | TRANSDERMAL | 0 refills | Status: DC

## 2016-11-28 MED ORDER — HYDROCODONE-ACETAMINOPHEN 10-325 MG PO TABS: 1.0000 | ORAL_TABLET | Freq: Four times a day (QID) | ORAL | 0 refills | Status: DC | PRN

## 2016-11-28 NOTE — Progress Notes (Signed)
Subjective:    Patient ID: Jill Phillips, female    DOB: [birth year], 61 y.o.   MRN: [id number]  HPI: [name] is a 61 year old female who returns for follow up appointmentfor chronic pain and medication refill. She states her pain is located in her mid-lower backand rectal pain. States her PCP following her rectal pain. She rates her pain 6.Her current exercise regime is walking and performing stretching exercises. Daughter in the room.  Pain Inventory Average Pain 6 Pain Right Now 6 My pain is burning and aching  In the last 24 hours, has pain interfered with the following? General activity 5 Relation with others 5 Enjoyment of life 7 What TIME of day is your pain at its worst? morning, daytime, night Sleep (in general) Fair  Pain is worse with: walking, bending, sitting, standing and some activites Pain improves with: rest and medication Relief from Meds: 6  Mobility Do you have any goals in this area?  no  Function Do you have any goals in this area?  no  Neuro/Psych numbness spasms  Prior Studies Any changes since last visit?  no  Physicians involved in your care Any changes since last visit?  no   Family History  Problem Relation Age of Onset  . Kidney disease Mother   . Heart disease Father   . Anuerysm Brother 29    brain  . Heart disease Brother   . Heart disease Sister 60    s/p CABG  . Hypertension Sister   . Colon cancer Neg Hx    Social History   Social History  . Marital status: Divorced    Spouse name: n/a  . Number of children: 2  . Years of education: 12+   Occupational History  . disability     back surgeries   Social History Main Topics  . Smoking status: Current Every Day Smoker    Packs/day: 1.25    Years: 45.00    Types: Cigarettes  . Smokeless tobacco: Never Used  . Alcohol use No  . Drug use: Yes    Types: Marijuana     Comment: 01/31/2016 "none since ~ 1980"  . Sexual activity: No   Other Topics Concern   . None   Social History Narrative   Lives alone.  One daughter is local, but is getting ready to move to [place], where her children live with their father.  The other daughter lives near [place], [place].   Past Surgical History:  Procedure Laterality Date  . APPENDECTOMY    . BACK SURGERY     18 back surgeries (2 thoracic & 16 lumbar) (01/31/2016)  . DILATION AND CURETTAGE OF UTERUS    . HAMMER TOE SURGERY    . JOINT REPLACEMENT    . KNEE ARTHROSCOPY Right   . LAPAROSCOPIC CHOLECYSTECTOMY    . LUMBAR FUSION N/A 08/01/2015   Procedure: Right sided L1-2 and L2-3 transforaminal lumbar interbody fusion with cages, Extension of posterior fusion T12 to L3, Replaced pedicle screws bilaterally L1-L2 , Replaced left sided pedicle screws L-3. Instrumentation T12 to L3 using local bone graft, Vivigen allograft and cancellous chips;  Surgeon: [name], MD;  Location: [facility];  Service: Orthopedics;  Laterality: N/A;  . LUMBAR LAMINECTOMY/DECOMPRESSION MICRODISCECTOMY N/A 01/28/2014   Procedure: Minimally Invasive Right  L1-2 Microdiscectomy;  Surgeon: [name], MD;  Location: [facility];  Service: Orthopedics;  Laterality: N/A;  . TONSILLECTOMY    . TOTAL HIP  ARTHROPLASTY Right   . TOTAL KNEE ARTHROPLASTY  05/29/2012   Procedure: TOTAL KNEE ARTHROPLASTY;  Surgeon: Mcarthur Rossetti, MD;  Location: [facility];  Service: Orthopedics;  Laterality: Right;  Right Total Knee Arthroplasty  . TUBAL LIGATION    . UVULOPALATOPHARYNGOPLASTY    . VAGINAL HYSTERECTOMY     Past Medical History:  Diagnosis Date  . Active smoker   . Anxiety   . Calcifying tendinitis of shoulder   . Chronic back pain   . Chronic pain syndrome   . COPD (chronic obstructive pulmonary disease) ([facility])   . Dysthymic disorder   . Emphysema lung ([place])   . GERD (gastroesophageal reflux disease)   . Headache    "weekly maybe" (01/31/2016)  . Heart murmur    for years, nothing to be concerned about  . Herpes genitalia   .  History of blood transfusion 1980   related to "back surgery"  . Hyperlipidemia   . Hypertension   . Hypothyroidism   . Lumbago   . Osteoarthrosis, unspecified whether generalized or localized, lower leg   . Pain in joint, upper arm   . Pneumothorax, left 01/31/2016   S/P Left posterior subcostal pain injection on 01/30/2016  . PONV (postoperative nausea and vomiting)    gets nauseous  with longer surgery. Difficuty voiding after surgery  . Postlaminectomy syndrome, thoracic region   . Primary localized osteoarthrosis, lower leg   . Restless leg syndrome   . Sleep apnea    s/p surgery- last sleep study 2011- doesnt use oxygen or machine at night as instructed,.   12/2014- Dr Halford Chessman  reports it is negative.  . Thyroid disease    BP 114/77 (BP Location: Left Arm, Patient Position: Sitting, Cuff Size: Normal)   Pulse 84   Resp 14   SpO2 91%   Opioid Risk Score:   Fall Risk Score:  `1  Depression screen PHQ 2/9  Depression screen [facility] 2/9 05/15/2016 05/02/2016 12/07/2015 11/15/2015 10/13/2015 09/06/2015 05/22/2015  Decreased Interest 0 0 0 0 0 0 0  Down, Depressed, Hopeless 0 0 0 0 - 0 0  PHQ - 2 Score 0 0 0 0 0 0 0  Altered sleeping - - - - - - -  Tired, decreased energy - - - - - - -  Change in appetite - - - - - - -  Feeling bad or failure about yourself  - - - - - - -  Trouble concentrating - - - - - - -  Moving slowly or fidgety/restless - - - - - - -  Suicidal thoughts - - - - - - -  PHQ-9 Score - - - - - - -  Some recent data might be hidden    Review of Systems  Constitutional: Negative.   HENT: Negative.   Eyes: Negative.   Respiratory: Negative.   Cardiovascular: Negative.   Gastrointestinal: Positive for abdominal pain.  Endocrine: Negative.   Genitourinary: Negative.   Musculoskeletal: Positive for arthralgias, back pain and gait problem.       Spasms   Skin: Negative.   Allergic/Immunologic: Negative.   Neurological: Positive for numbness.  Hematological:  Negative.   Psychiatric/Behavioral: Negative.   All other systems reviewed and are negative.      Objective:   Physical Exam  Constitutional: She is oriented to person, place, and time. She appears well-developed and well-nourished.  HENT:  Head: Normocephalic and atraumatic.  Neck: Normal range of motion. Neck supple.  Cardiovascular: Normal rate and regular rhythm.   Pulmonary/Chest: Effort normal and breath sounds normal.  Musculoskeletal:  Normal Muscle Bulk and Muscle Testing Reveals: Upper Extremities: Full ROM and Muscle Strength 5/5 Thoracic Paraspinal Tenderness: T-10- T-12 Lumbar Paraspinal Tenderness: L-4-L-5 Lower Extremities; Full ROM and Muscle Strength 5/5 Arises from Table with ease Narrow Based Gait  Neurological: She is alert and oriented to person, place, and time.  Skin: Skin is warm and dry.  Psychiatric: She has a normal mood and affect.  Nursing note and vitals reviewed.         Assessment & Plan:  1. Chronic lumbar spine pain/post-lami syndrome: 11/28/2016 Refilled: Fentanyl 50 MCG one patch every three days #10 and Hydrocodone 10/325 mg one tablet every 6 hours as needed for pain #120. We will continue the opioid monitoring program, this consists of regular clinic visits, examinations, urine drug screen, pill counts as well as use of [place] Controlled Substance Reporting System. 2. Chronic Bilateral Thoracic Pain: Continue current medication regime.11/28/16 3. Osteoarthritis of the knees bilaterally:No complaints today. Continue with Heat, exercise and voltaren gel. 11/28/2016 4. Rotator cuff syndrome/subacromial bursitis: No complaints voiced today. 11/28/2016 5. Restless legs syndrome: Continue Requip.Continue to monitor. 11/28/2016 6. Tobacco Abuse: Encourage Smoking Cessation: Pulmonologist Following. 11/28/2016 7. Emphysema: Pulmonology Following. 11/28/2016 8. Muscle Spasm: Continue Soma. 11/01/2016 9. Insomnia: Continue Seroquel.  11/28/2016 10. Neuropathic Pain: Continue Gabapentin.11/28/2016  20 minutes of face to face patient care time was spent during this visit. All questions were encouraged and answered.   F/u in 1 month

## 2016-11-29 ENCOUNTER — Other Ambulatory Visit: Payer: Self-pay | Admitting: Pulmonary Disease

## 2016-12-02 ENCOUNTER — Encounter: Payer: Medicare HMO | Admitting: Registered Nurse

## 2016-12-02 LAB — TOXASSURE SELECT,+ANTIDEPR,UR

## 2016-12-03 ENCOUNTER — Telehealth (INDEPENDENT_AMBULATORY_CARE_PROVIDER_SITE_OTHER): Payer: Self-pay | Admitting: Specialist

## 2016-12-03 NOTE — Telephone Encounter (Signed)
Pt requested a call back regarding her MRI, states she is suffering and has not heard back at all.  [phone]

## 2016-12-03 NOTE — Telephone Encounter (Signed)
I called patient back and advised her that Dr. [name] does have a message in his inbox on her to call the radiologist. I told her that I would try to get him or myself would call her back as soon as he could find something out.

## 2016-12-05 ENCOUNTER — Telehealth (INDEPENDENT_AMBULATORY_CARE_PROVIDER_SITE_OTHER): Payer: Self-pay | Admitting: Specialist

## 2016-12-05 NOTE — Telephone Encounter (Signed)
Patient calling this morning in regards to the MRI done of the back.  She came back and saw you on the 03/15 and states you were going to get in touch with her about something that was not clear on the imaging and that there was no need for her to return for an appointment until further notice.  She also wants to let you know that she did have the colonoscopy 11/22/16, but is still waiting for you.  Please call and advise.

## 2016-12-06 NOTE — Telephone Encounter (Signed)
Patient calling this morning in regards to the MRI done of the back.  She came back and saw you on the 03/15 and states you were going to get in touch with her about something that was not clear on the imaging and that there was no need for her to return for an appointment until further notice.  She also wants to let you know that she did have the colonoscopy 11/22/16, but is still waiting for you.  Please call and advise

## 2016-12-09 ENCOUNTER — Ambulatory Visit (INDEPENDENT_AMBULATORY_CARE_PROVIDER_SITE_OTHER): Payer: Medicare HMO | Admitting: Specialist

## 2016-12-09 ENCOUNTER — Telehealth (INDEPENDENT_AMBULATORY_CARE_PROVIDER_SITE_OTHER): Payer: Self-pay | Admitting: Specialist

## 2016-12-09 NOTE — Telephone Encounter (Signed)
Patient states she has been trying to contact [place] for a few weeks, she wants him to call her asap. Please call to discuss. cb#: [phone]

## 2016-12-09 NOTE — Telephone Encounter (Signed)
The phone number for [facility] is [phone]

## 2016-12-13 NOTE — Telephone Encounter (Signed)
Pt called today, she is concerned because she has not had a call back. I advised pt that these messages would be returned when Dr. [name] was able to.

## 2016-12-16 ENCOUNTER — Telehealth: Payer: Self-pay | Admitting: Registered Nurse

## 2016-12-16 NOTE — Telephone Encounter (Signed)
Jill Phillips had a UDS performed on 11/28/2016, it was consistent.

## 2016-12-17 ENCOUNTER — Encounter (INDEPENDENT_AMBULATORY_CARE_PROVIDER_SITE_OTHER): Payer: Self-pay | Admitting: Specialist

## 2016-12-17 ENCOUNTER — Ambulatory Visit (INDEPENDENT_AMBULATORY_CARE_PROVIDER_SITE_OTHER): Payer: Medicare HMO | Admitting: Specialist

## 2016-12-17 VITALS — BP 135/78 | HR 105 | Ht 64.0 in | Wt 159.0 lb

## 2016-12-17 DIAGNOSIS — M5442 Lumbago with sciatica, left side: Secondary | ICD-10-CM

## 2016-12-17 DIAGNOSIS — K594 Anal spasm: Secondary | ICD-10-CM

## 2016-12-17 DIAGNOSIS — M960 Pseudarthrosis after fusion or arthrodesis: Secondary | ICD-10-CM | POA: Diagnosis not present

## 2016-12-17 NOTE — Patient Instructions (Signed)
Avoid frequent bending and stooping  No lifting greater than 10 lbs. May use ice or moist heat for pain. Weight loss is of benefit. Handicap license is approved.   

## 2016-12-17 NOTE — Progress Notes (Addendum)
Office Visit Note   Patient: Jill Phillips           Date of Birth: 12-20-1955           MRN: [id number] Visit Date: 12/17/2016              Requested by: [name], MD [place], [place] [address] PCP: [name], MD   Assessment & Plan: Visit Diagnoses:  1. Acute midline low back pain with left-sided sciatica   2. Proctalgia fugax   3. Pseudarthrosis following spinal fusion   61 year old female with history of multiple lumbar fusion surgeries, the last in 07/2015 was an extension of a L3 to S1 fusion to incorporate L1-2, L2-3 and T12-L1 into a fusion at the T11-12 level, she has never been pain free with complaints of pain in the left lower ribs, follow up studies showing spondylosis changes with left T9-10 foramenal entrapment, unfortunately treatment complicated by a left pneumothorax with left intercostal blocks. In 08/2016 CT scan with no significant fusion masses Interbody fusion site L2-3 and at the T12-L1 level posteriorly. Has been using an external fusion stimulator. Reports that she is experiencing pain  In her anus and now pain in the lower GI tract and rectum. Underwent recent colonoscopy by Dr. Fuller Plan which was negative for neoplasia,did Have difficulty with flexible scope due to narrowing of the entry otherwise  Normal exam. At last visit I reviewed her MRI and noted areas of ventral Soft tissue prominence at the L5-S1 level with impression on the thecal sac. The T1 and T2 characteristics where not classic for scar so I did Call and discuss with the [facility]iologist Dr. Ed Blalock. He indicated that he would be willing to review the previous MRIs and myelograms on Jill Phillips and discuss the findings with fellow radiologists to help determine if the study is able to identify any changes  In this area that could relate to distal caudle pain. I discussed the current  Finding and her clinical exam that is not very focal. I have asked Jill Phillips to fill out  release of information papers and I have placed an order for consultation by Dr. Ed Blalock, [facility] to review the  Numerous studies and give an opinion concerning the findings at this level She fell asleep several times during conversation. But after our conversation asked if I would prescribe stronger medications, to which I  Explained that she should discuss this with Dr. Tessa Lerner in pain management. She indicated that I could take over pain management around the time of surgery and could do this now, and I explained that I will not take over her narcotics as she takes a substantial amount and this Needs to be handled by one physician. As her last study suggested pseudarthrosis T12-L1 and L2-3 was done in 10/20117 nearly 6 months ago, I recommend repeating her lumbar CT to assess current status of her fusions at nearly 17 months post fusion. Return in 3 weeks for followup.Other complaints listed today are left hip and buttock pain, left rib pain, neuropathy. I would like to limit her radiation exposure, she has already completed a CT scan in October 2017 but no other way of assessing the fusion maturity at the T12-L1, L1-2 and L2-3 levels. There is reason to suspect pseudarthrosis at the T12-L1 and L2-3 levels and formanenal stenosis left T9-10.   Plan:Avoid frequent bending and stooping  No lifting greater than 10 lbs. May  use ice or moist heat for pain. Weight loss is of benefit. Handicap license is approved. Also recommend stop smoking. Please contact Pain Management concerning narcotic medicationa and Any need to request increasing narcotics.   Follow-Up Instructions: Return in about 2 weeks (around 12/31/2016).   Orders:  Orders Placed This Encounter  Procedures  . CT LUMBAR SPINE WO CONTRAST   No orders of the defined types were placed in this encounter.     Procedures: No procedures performed   Clinical Data: No additional findings.   Subjective: Chief  Complaint  Patient presents with  . Lower Back - Follow-up    HPI  Review of Systems  Constitutional: Negative.   HENT: Negative.   Eyes: Negative.   Respiratory: Negative.   Cardiovascular: Negative.   Gastrointestinal: Negative.   Endocrine: Negative.   Genitourinary: Negative.   Musculoskeletal: Negative.   Skin: Negative.   Allergic/Immunologic: Negative.   Neurological: Negative.   Hematological: Negative.   Psychiatric/Behavioral: Negative.      Objective: Vital Signs: BP 135/78 (BP Location: Left Arm, Patient Position: Sitting)   Pulse (!) 105   Ht 5\' 4"  (1.626 m)   Wt 159 lb (72.1 kg)   BMI 27.29 kg/m   Physical Exam  Constitutional: She is oriented to person, place, and time. She appears well-developed and well-nourished.  HENT:  Head: Normocephalic and atraumatic.  Eyes: EOM are normal. Pupils are equal, round, and reactive to light.  Neck: Normal range of motion. Neck supple.  Pulmonary/Chest: Effort normal and breath sounds normal.  Abdominal: Soft. Bowel sounds are normal.  Musculoskeletal: Normal range of motion.  Neurological: She is alert and oriented to person, place, and time.  Skin: Skin is warm and dry.  Psychiatric: She has a normal mood and affect. Her behavior is normal. Judgment and thought content normal.    Back Exam   Tenderness  The patient is experiencing tenderness in the lumbar.      Specialty Comments:  No specialty comments available.  Imaging: No results found.   PMFS History: Patient Active Problem List   Diagnosis Date Noted  . Degenerative disc disease, lumbar 08/01/2015    Priority: High    Class: Chronic  . Spondylolisthesis of lumbar region 08/01/2015    Priority: High    Class: Chronic  . CKD (chronic kidney disease) stage 3, GFR 30-59 ml/min 08/07/2016  . GERD (gastroesophageal reflux disease) 08/07/2016  . Prediabetes 08/07/2016  . Chronic respiratory failure ([place]) 07/29/2016  . Hypersomnia  07/29/2016  . Arthritis of carpometacarpal [facility]) joint of left thumb 07/09/2016  . Lump of axilla 05/02/2016  . Pneumothorax on left 01/31/2016  . Chronic, continuous use of opioids 09/06/2015  . Urinary, incontinence, stress female 05/06/2014  . Constipation 05/05/2014  . 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 HTN (hypertension) 05/19/2013  . HLD (hyperlipidemia) 05/19/2013  . Depression 05/19/2013  . Hypothyroidism 05/19/2013  . Tobacco abuse   . Osteoarthritis of both knees 11/18/2011  . RLS (restless legs syndrome) 11/18/2011   Past Medical History:  Diagnosis Date  . Active smoker   . Anxiety   . Calcifying tendinitis of shoulder   . Chronic back pain   . Chronic pain syndrome   . COPD (chronic obstructive pulmonary disease) ([place])   . Dysthymic disorder   . Emphysema lung ([place])   . GERD (gastroesophageal reflux disease)   . Headache    "weekly maybe" (01/31/2016)  . Heart murmur    for years, nothing to be concerned  about  . Herpes genitalia   . History of blood transfusion 1980   related to "back surgery"  . Hyperlipidemia   . Hypertension   . Hypothyroidism   . Lumbago   . Osteoarthrosis, unspecified whether generalized or localized, lower leg   . Pain in joint, upper arm   . Pneumothorax, left 01/31/2016   S/P Left posterior subcostal pain injection on 01/30/2016  . PONV (postoperative nausea and vomiting)    gets nauseous  with longer surgery. Difficuty voiding after surgery  . Postlaminectomy syndrome, thoracic region   . Primary localized osteoarthrosis, lower leg   . Restless leg syndrome   . Sleep apnea    s/p surgery- last sleep study 2011- doesnt use oxygen or machine at night as instructed,.   12/2014- Dr Halford Chessman  reports it is negative.  . Thyroid disease     Family History  Problem Relation Age of Onset  . Kidney disease Mother   . Heart disease Father   . Anuerysm Brother 29    brain  . Heart disease Brother   . Heart disease Sister 59    s/p CABG  .  Hypertension Sister   . Colon cancer Neg Hx     Past Surgical History:  Procedure Laterality Date  . APPENDECTOMY    . BACK SURGERY     18 back surgeries (2 thoracic & 16 lumbar) (01/31/2016)  . DILATION AND CURETTAGE OF UTERUS    . HAMMER TOE SURGERY    . JOINT REPLACEMENT    . KNEE ARTHROSCOPY Right   . LAPAROSCOPIC CHOLECYSTECTOMY    . LUMBAR FUSION N/A 08/01/2015   Procedure: Right sided L1-2 and L2-3 transforaminal lumbar interbody fusion with cages, Extension of posterior fusion T12 to L3, Replaced pedicle screws bilaterally L1-L2 , Replaced left sided pedicle screws L-3. Instrumentation T12 to L3 using local bone graft, Vivigen allograft and cancellous chips;  Surgeon: [name], MD;  Location: [facility];  Service: Orthopedics;  Laterality: N/A;  . LUMBAR LAMINECTOMY/DECOMPRESSION MICRODISCECTOMY N/A 01/28/2014   Procedure: Minimally Invasive Right  L1-2 Microdiscectomy;  Surgeon: [name], MD;  Location: [facility];  Service: Orthopedics;  Laterality: N/A;  . TONSILLECTOMY    . TOTAL HIP ARTHROPLASTY Right   . TOTAL KNEE ARTHROPLASTY  05/29/2012   Procedure: TOTAL KNEE ARTHROPLASTY;  Surgeon: Mcarthur Rossetti, MD;  Location: [facility];  Service: Orthopedics;  Laterality: Right;  Right Total Knee Arthroplasty  . TUBAL LIGATION    . UVULOPALATOPHARYNGOPLASTY    . VAGINAL HYSTERECTOMY     Social History   Occupational History  . disability     back surgeries   Social History Main Topics  . Smoking status: Current Every Day Smoker    Packs/day: 1.25    Years: 45.00    Types: Cigarettes  . Smokeless tobacco: Never Used  . Alcohol use No  . Drug use: Yes    Types: Marijuana     Comment: 01/31/2016 "none since ~ 1980"  . Sexual activity: No

## 2016-12-18 NOTE — Telephone Encounter (Signed)
Phone disconnection

## 2016-12-19 ENCOUNTER — Telehealth (INDEPENDENT_AMBULATORY_CARE_PROVIDER_SITE_OTHER): Payer: Self-pay

## 2016-12-19 NOTE — Telephone Encounter (Signed)
Faxed order to [facility]

## 2016-12-19 NOTE — Telephone Encounter (Signed)
Jill Phillips with [facility] would like an order for a CT Scan L-Spine faxed to [phone] for patient. Thank You

## 2016-12-19 NOTE — Telephone Encounter (Signed)
Can you do this? thanks

## 2016-12-19 NOTE — Telephone Encounter (Signed)
Seen in office and discussed. Jill Phillips

## 2016-12-20 ENCOUNTER — Emergency Department (HOSPITAL_COMMUNITY)
Admission: EM | Admit: 2016-12-20 | Discharge: 2016-12-20 | Discharge status: Against Medical Advice | Payer: Medicare HMO

## 2016-12-20 DIAGNOSIS — M960 Pseudarthrosis after fusion or arthrodesis: Secondary | ICD-10-CM | POA: Diagnosis not present

## 2016-12-20 DIAGNOSIS — M4328 Fusion of spine, sacral and sacrococcygeal region: Secondary | ICD-10-CM | POA: Diagnosis not present

## 2016-12-20 DIAGNOSIS — M5115 Intervertebral disc disorders with radiculopathy, thoracolumbar region: Secondary | ICD-10-CM | POA: Diagnosis not present

## 2016-12-20 DIAGNOSIS — M4327 Fusion of spine, lumbosacral region: Secondary | ICD-10-CM | POA: Diagnosis not present

## 2016-12-25 ENCOUNTER — Ambulatory Visit (INDEPENDENT_AMBULATORY_CARE_PROVIDER_SITE_OTHER): Payer: Medicare HMO | Admitting: Nurse Practitioner

## 2016-12-25 ENCOUNTER — Encounter: Payer: Self-pay | Admitting: Nurse Practitioner

## 2016-12-25 VITALS — BP 80/56 | HR 88 | Ht 64.0 in | Wt 159.8 lb

## 2016-12-25 DIAGNOSIS — K6289 Other specified diseases of anus and rectum: Secondary | ICD-10-CM

## 2016-12-25 NOTE — Patient Instructions (Addendum)
You have been given a separate informational sheet regarding your tobacco use, the importance of quitting and local resources to help you quit.  If you are age 62 or older, your body mass index should be between 23-30. Your Body mass index is 27.43 kg/m. If this is out of the aforementioned range listed, please consider follow up with your Primary Care Provider.  If you are age 32 or younger, your body mass index should be between 19-25. Your Body mass index is 27.43 kg/m. If this is out of the aformentioned range listed, please consider follow up with your Primary Care Provider.   Follow up as needed.  Thank you for choosing me and [facility].  Tye Savoy, NP

## 2016-12-25 NOTE — Progress Notes (Signed)
     HPI: Patient is a 61 year old female seen in the office early March for rectal pain present since January. Her rectal exam at that time was notable for possible fissure, though she was not really tender on exam. She underwent colonoscopy 11/22/16. Perianal and digital rectal exams were normal. The entire colon was normal but the prep was suboptimal. Unable to retroflex the scope due to a narrow rectal vault but no pathology found to explain her pain.   Patient is back with debilitating rectal pain, has to lay in bed 90 % of the time. It hurts to sit or stand. No pain with defecation. Perianal area doesn't hurt. It does hurt to press anorectal upward. Pain nearly constant. Patient has had multiple back surgeries, she recently saw Orthopedic surgeon. He plans to talk with Radiology and see if any of her imaging studies show any changes to cause distal caudle pain. She wants to know the next step.   Past Medical History:  Diagnosis Date  . Active smoker   . Anxiety   . Calcifying tendinitis of shoulder   . Chronic back pain   . Chronic pain syndrome   . COPD (chronic obstructive pulmonary disease) ([place])   . Dysthymic disorder   . Emphysema lung ([place])   . GERD (gastroesophageal reflux disease)   . Headache    "weekly maybe" (01/31/2016)  . Heart murmur    for years, nothing to be concerned about  . Herpes genitalia   . History of blood transfusion 1980   related to "back surgery"  . Hyperlipidemia   . Hypertension   . Hypothyroidism   . Lumbago   . Osteoarthrosis, unspecified whether generalized or localized, lower leg   . Pain in joint, upper arm   . Pneumothorax, left 01/31/2016   S/P Left posterior subcostal pain injection on 01/30/2016  . PONV (postoperative nausea and vomiting)    gets nauseous  with longer surgery. Difficuty voiding after surgery  . Postlaminectomy syndrome, thoracic region   . Primary localized osteoarthrosis, lower leg   . Restless leg syndrome   . Sleep  apnea    s/p surgery- last sleep study 2011- doesnt use oxygen or machine at night as instructed,.   12/2014- Dr Halford Chessman  reports it is negative.  . Thyroid disease     Patient's surgical history, family medical history, social history, medications and allergies were all reviewed in Epic    Physical Exam: BP (!) 80/56   Pulse 88   Ht 5\' 4"  (1.626 m)   Wt 159 lb 12.8 oz (72.5 kg)   BMI 27.43 kg/m   GENERAL: well developed white female in NAD PSYCH: :Pleasant, cooperative, normal affect  ASSESSMENT and PLAN:  61 year old female with chronic anorectal pain. No findings on colonoscopy to explain pain. Pain not typical for proctalgia fugax which is usually more transient and sharp spasm type pain. Her pain is nearly constant, it has a strong positional component. She is working with her Doctor, general practice on finding a cause.   Tye Savoy , NP 12/25/2016, 2:33 PM

## 2016-12-26 ENCOUNTER — Encounter: Payer: Medicare HMO | Attending: Physical Medicine & Rehabilitation | Admitting: Registered Nurse

## 2016-12-26 ENCOUNTER — Telehealth: Payer: Self-pay | Admitting: Registered Nurse

## 2016-12-26 ENCOUNTER — Encounter: Payer: Self-pay | Admitting: Registered Nurse

## 2016-12-26 VITALS — BP 136/85 | HR 93

## 2016-12-26 DIAGNOSIS — J449 Chronic obstructive pulmonary disease, unspecified: Secondary | ICD-10-CM | POA: Diagnosis not present

## 2016-12-26 DIAGNOSIS — M4316 Spondylolisthesis, lumbar region: Secondary | ICD-10-CM | POA: Diagnosis not present

## 2016-12-26 DIAGNOSIS — M170 Bilateral primary osteoarthritis of knee: Secondary | ICD-10-CM | POA: Diagnosis not present

## 2016-12-26 DIAGNOSIS — M961 Postlaminectomy syndrome, not elsewhere classified: Secondary | ICD-10-CM | POA: Insufficient documentation

## 2016-12-26 DIAGNOSIS — E785 Hyperlipidemia, unspecified: Secondary | ICD-10-CM | POA: Diagnosis not present

## 2016-12-26 DIAGNOSIS — Z5181 Encounter for therapeutic drug level monitoring: Secondary | ICD-10-CM | POA: Diagnosis not present

## 2016-12-26 DIAGNOSIS — G894 Chronic pain syndrome: Secondary | ICD-10-CM | POA: Insufficient documentation

## 2016-12-26 DIAGNOSIS — G8929 Other chronic pain: Secondary | ICD-10-CM

## 2016-12-26 DIAGNOSIS — G9619 Other disorders of meninges, not elsewhere classified: Secondary | ICD-10-CM | POA: Insufficient documentation

## 2016-12-26 DIAGNOSIS — I10 Essential (primary) hypertension: Secondary | ICD-10-CM | POA: Diagnosis not present

## 2016-12-26 DIAGNOSIS — M5136 Other intervertebral disc degeneration, lumbar region: Secondary | ICD-10-CM

## 2016-12-26 DIAGNOSIS — Z79899 Other long term (current) drug therapy: Secondary | ICD-10-CM | POA: Insufficient documentation

## 2016-12-26 DIAGNOSIS — Z9981 Dependence on supplemental oxygen: Secondary | ICD-10-CM | POA: Diagnosis not present

## 2016-12-26 DIAGNOSIS — M7918 Myalgia, other site: Secondary | ICD-10-CM

## 2016-12-26 DIAGNOSIS — E079 Disorder of thyroid, unspecified: Secondary | ICD-10-CM | POA: Diagnosis not present

## 2016-12-26 DIAGNOSIS — M2170 Unequal limb length (acquired), unspecified site: Secondary | ICD-10-CM | POA: Insufficient documentation

## 2016-12-26 DIAGNOSIS — Z720 Tobacco use: Secondary | ICD-10-CM | POA: Diagnosis not present

## 2016-12-26 DIAGNOSIS — M546 Pain in thoracic spine: Secondary | ICD-10-CM | POA: Diagnosis not present

## 2016-12-26 DIAGNOSIS — M791 Myalgia: Secondary | ICD-10-CM | POA: Diagnosis not present

## 2016-12-26 DIAGNOSIS — M5126 Other intervertebral disc displacement, lumbar region: Secondary | ICD-10-CM | POA: Insufficient documentation

## 2016-12-26 DIAGNOSIS — Z9889 Other specified postprocedural states: Secondary | ICD-10-CM | POA: Diagnosis not present

## 2016-12-26 DIAGNOSIS — K219 Gastro-esophageal reflux disease without esophagitis: Secondary | ICD-10-CM | POA: Diagnosis not present

## 2016-12-26 DIAGNOSIS — G2581 Restless legs syndrome: Secondary | ICD-10-CM | POA: Diagnosis not present

## 2016-12-26 DIAGNOSIS — E039 Hypothyroidism, unspecified: Secondary | ICD-10-CM | POA: Insufficient documentation

## 2016-12-26 MED ORDER — FENTANYL 50 MCG/HR TD PT72: 50.0000 ug | MEDICATED_PATCH | TRANSDERMAL | 0 refills | Status: DC

## 2016-12-26 MED ORDER — HYDROCODONE-ACETAMINOPHEN 10-325 MG PO TABS
1.0000 | ORAL_TABLET | Freq: Four times a day (QID) | ORAL | 0 refills | Status: DC | PRN
Start: 2016-12-26 — End: 2017-01-23

## 2016-12-26 NOTE — Progress Notes (Signed)
Subjective:    Patient ID: Jill Phillips, female    DOB: 1955/10/11, 61 y.o.   MRN: [id number]  HPI: Jill Phillips is a 61 year old female who returns for follow up appointmentfor chronic pain and medication refill. She states her pain is located in her mid-lower back and rectal pain. States her Gastroenterologist is  following her rectal pain. She rates her pain 8.Her current exercise regime is walking and performing stretching exercises. Daughter in the room.  Last UDS was on 11/28/2016, it was consistent.  Pain Inventory Average Pain 7 Pain Right Now 8 My pain is burning, tingling and aching  In the last 24 hours, has pain interfered with the following? General activity 8 Relation with others 8 Enjoyment of life 9 What TIME of day is your pain at its worst? morning daytime and evening Sleep (in general) Fair  Pain is worse with: walking, bending, sitting, inactivity and standing Pain improves with: medication Relief from Meds: 7  Mobility walk without assistance how many minutes can you walk? 10 ability to climb steps?  yes do you drive?  yes  Function disabled: date disabled .  Neuro/Psych spasms  Prior Studies Any changes since last visit?  no  Physicians involved in your care Any changes since last visit?  no   Family History  Problem Relation Age of Onset  . Kidney disease Mother   . Heart disease Father   . Anuerysm Brother 29    brain  . Heart disease Brother   . Heart disease Sister 63    s/p CABG  . Hypertension Sister   . Colon cancer Neg Hx    Social History   Social History  . Marital status: Divorced    Spouse name: n/a  . Number of children: 2  . Years of education: 12+   Occupational History  . disability     back surgeries   Social History Main Topics  . Smoking status: Current Every Day Smoker    Packs/day: 1.25    Years: 45.00    Types: Cigarettes  . Smokeless tobacco: Never Used     Comment: form given 12/25/16    . Alcohol use No  . Drug use: No     Comment: 01/31/2016 "none since ~ 1980"  . Sexual activity: No   Other Topics Concern  . None   Social History Narrative   Lives alone.  One daughter is local, but is getting ready to move to [place], where her children live with their father.  The other daughter lives near [place], [place].   Past Surgical History:  Procedure Laterality Date  . APPENDECTOMY    . BACK SURGERY     18 back surgeries (2 thoracic & 16 lumbar) (01/31/2016)  . DILATION AND CURETTAGE OF UTERUS    . HAMMER TOE SURGERY    . JOINT REPLACEMENT    . KNEE ARTHROSCOPY Right   . LAPAROSCOPIC CHOLECYSTECTOMY    . LUMBAR FUSION N/A 08/01/2015   Procedure: Right sided L1-2 and L2-3 transforaminal lumbar interbody fusion with cages, Extension of posterior fusion T12 to L3, Replaced pedicle screws bilaterally L1-L2 , Replaced left sided pedicle screws L-3. Instrumentation T12 to L3 using local bone graft, Vivigen allograft and cancellous chips;  Surgeon: [name], MD;  Location: [facility];  Service: Orthopedics;  Laterality: N/A;  . LUMBAR LAMINECTOMY/DECOMPRESSION MICRODISCECTOMY N/A 01/28/2014   Procedure: Minimally Invasive Right  L1-2 Microdiscectomy;  Surgeon: [name], MD;  Location: [facility];  Service: Orthopedics;  Laterality: N/A;  . TOTAL HIP ARTHROPLASTY Right   . TOTAL KNEE ARTHROPLASTY  05/29/2012   Procedure: TOTAL KNEE ARTHROPLASTY;  Surgeon: Mcarthur Rossetti, MD;  [facility];  Service: Orthopedics;  Laterality: Right;  Right Total Knee Arthroplasty  . TUBAL LIGATION    . UVULOPALATOPHARYNGOPLASTY    . VAGINAL HYSTERECTOMY     Past Medical History:  Diagnosis Date  . Active smoker   . Anxiety   . Calcifying tendinitis of shoulder   . Chronic back pain   . Chronic pain syndrome   . COPD (chronic obstructive pulmonary disease) ([place])   . Dysthymic disorder   . Emphysema lung ([place])   . GERD (gastroesophageal reflux disease)   . Headache    "weekly  maybe" (01/31/2016)  . Heart murmur    for years, nothing to be concerned about  . Herpes genitalia   . History of blood transfusion 1980   related to "back surgery"  . Hyperlipidemia   . Hypertension   . Hypothyroidism   . Lumbago   . Osteoarthrosis, unspecified whether generalized or localized, lower leg   . Pain in joint, upper arm   . Pneumothorax, left 01/31/2016   S/P Left posterior subcostal pain injection on 01/30/2016  . PONV (postoperative nausea and vomiting)    gets nauseous  with longer surgery. Difficuty voiding after surgery  . Postlaminectomy syndrome, thoracic region   . Primary localized osteoarthrosis, lower leg   . Restless leg syndrome   . Sleep apnea    s/p surgery- last sleep study 2011- doesnt use oxygen or machine at night as instructed,.   12/2014- Dr Halford Chessman  reports it is negative.  . Thyroid disease    BP 136/85 (BP Location: Left Arm, Patient Position: Sitting, Cuff Size: Normal)   Pulse 93   SpO2 94%   Opioid Risk Score:   Fall Risk Score:  `1  Depression screen PHQ 2/9  Depression screen [facility] 2/9 05/15/2016 05/02/2016 12/07/2015 11/15/2015 10/13/2015 09/06/2015 05/22/2015  Decreased Interest 0 0 0 0 0 0 0  Down, Depressed, Hopeless 0 0 0 0 - 0 0  PHQ - 2 Score 0 0 0 0 0 0 0  Altered sleeping - - - - - - -  Tired, decreased energy - - - - - - -  Change in appetite - - - - - - -  Feeling bad or failure about yourself  - - - - - - -  Trouble concentrating - - - - - - -  Moving slowly or fidgety/restless - - - - - - -  Suicidal thoughts - - - - - - -  PHQ-9 Score - - - - - - -  Some recent data might be hidden    Review of Systems  Constitutional: Positive for unexpected weight change.  Eyes: Negative.   Respiratory: Negative.   Cardiovascular: Negative.   Gastrointestinal: Positive for abdominal pain and constipation.  Endocrine: Negative.   Genitourinary: Negative.   Musculoskeletal: Negative.   Skin: Negative.   Allergic/Immunologic: Negative.     Neurological: Negative.   Hematological: Negative.   Psychiatric/Behavioral: Negative.   All other systems reviewed and are negative.      Objective:   Physical Exam  Constitutional: She is oriented to person, place, and time. She appears well-developed and well-nourished.  HENT:  Head: Normocephalic and atraumatic.  Neck: Normal range of motion. Neck supple.  Cardiovascular: Normal rate and  regular rhythm.   Pulmonary/Chest: Effort normal and breath sounds normal.  Musculoskeletal:  Normal Muscle Bulk and Muscle Testing Reveals: Upper Extremities: Full ROM and Muscle Strength 5/5 Thoracic Paraspinal Tenderness: T-7-T-9 Lower Extremities: Full ROM and Muscle Strength 5/5 Arises from Table with ease Narrow Based Gait  Neurological: She is alert and oriented to person, place, and time.  Skin: Skin is warm and dry.  Psychiatric: She has a normal mood and affect.  Nursing note and vitals reviewed.         Assessment & Plan:  1. Chronic lumbar spine pain/post-lami syndrome: 12/26/2016 Refilled: Fentanyl 50 MCG one patch every three days #10 and Hydrocodone 10/325 mg one tablet every 6 hours as needed for pain #120. We will continue the opioid monitoring program, this consists of regular clinic visits, examinations, urine drug screen, pill counts as well as use of [place] Controlled Substance Reporting System. 2. Chronic Bilateral Thoracic Pain: Continue current medication regime.12/26/16 3. Osteoarthritis of the knees bilaterally:No complaints today. Continue with Heat, exercise and voltaren gel. 12/26/2016 4. Rotator cuff syndrome/subacromial bursitis: No complaints voiced today. 12/26/2016 5. Restless legs syndrome: Continue Requip.Continue to monitor. 12/26/2016 6. Tobacco Abuse: Encourage Smoking Cessation: Pulmonologist Following. 12/26/2016 7. Emphysema: Pulmonology Following. 12/26/2016 8. Muscle Spasm: Continue Soma. 12/26/2016 9. Insomnia: Continue Seroquel.  12/26/2016 10. Neuropathic Pain: Continue Gabapentin.12/26/2016 11.Anxiety: Continue Klonopin  20 minutes of face to face patient care time was spent during this visit. All questions were encouraged and answered.  F/u in 1 month

## 2016-12-26 NOTE — Progress Notes (Signed)
Reviewed and agree with management plan. Her pain is not from a GI disorder. Her pain is musculoskeletal or neuropathic. Return to her orthopedic surgeon and PCP for further evaluation.    Pricilla Riffle. Fuller Plan, MD [facility]

## 2016-12-26 NOTE — Telephone Encounter (Signed)
On 12/26/2016 the [place] was reviewed no conflict was seen on the [place] with multiple prescribers.Ms. Jenning has a signed narcotic contract with our office. If there were any discrepancies this would have been reported to her physician.

## 2016-12-31 ENCOUNTER — Other Ambulatory Visit: Payer: Self-pay | Admitting: Adult Health

## 2017-01-01 ENCOUNTER — Telehealth: Payer: Self-pay | Admitting: *Deleted

## 2017-01-01 NOTE — Telephone Encounter (Signed)
Return Ms. [place] call, she was instructed not to increase her analgesics, she verbalizes understanding. Also states she has called her Gastroenterologist and has asked her PCP to make an appointment with a Urologist.

## 2017-01-01 NOTE — Telephone Encounter (Signed)
Patient left a message stating that she had discusssed her excruciating rectal pain at her last visit with Zella Ball.  She has had colonoscopies, seen specialists. Her pain is unbearable, she is in bed 90% of the time, she is using ice treatment.  She wants Zella Ball to call her back to discuss possibly increasing medication intake to every 4 hours.  Please call back

## 2017-01-02 ENCOUNTER — Encounter: Payer: Self-pay | Admitting: Nurse Practitioner

## 2017-01-02 ENCOUNTER — Ambulatory Visit (INDEPENDENT_AMBULATORY_CARE_PROVIDER_SITE_OTHER): Payer: Medicare HMO | Admitting: Nurse Practitioner

## 2017-01-02 VITALS — BP 122/74 | HR 92 | Temp 98.3°F | Ht 64.0 in | Wt 159.0 lb

## 2017-01-02 DIAGNOSIS — K6289 Other specified diseases of anus and rectum: Secondary | ICD-10-CM | POA: Diagnosis not present

## 2017-01-02 MED ORDER — DILTIAZEM GEL 2 %: 1.0000 "application " | Freq: Two times a day (BID) | CUTANEOUS | 1 refills | Status: DC

## 2017-01-02 NOTE — Progress Notes (Signed)
Pre visit review using our clinic review tool, if applicable. No additional management support is needed unless otherwise documented below in the visit note. 

## 2017-01-02 NOTE — Patient Instructions (Addendum)
You may also use Lidocaine gel (over the counter product) as directed on package.   Proctalgia Fugax Proctalgia fugax is a condition that involves very short episodes of intense pain in the rectum. The rectum is the last part of the large intestine. The pain can last from seconds to minutes. Episodes often occur during the night and awaken the person from sleep. This condition is not a sign of cancer, but your health care provider may want to rule out a number of other conditions. What are the causes? The cause of this condition is not known. One possible cause may be spasm of the pelvic muscles or of the lowest part of the large intestine. What are the signs or symptoms? The only symptom of this condition is rectal pain.  The pain may be intense or severe.  The pain may last for only a few seconds or it may last up to 30 minutes.  The pain may occur at night and wake you up from sleep. How is this diagnosed? This condition may be diagnosed by ruling out other problems that could cause the pain. Diagnosis may include:  Medical history and physical exam.  Various tests, such as:  Anoscopy. In this test, a lighted scope is put into the rectum to look for abnormalities.  Barium enema. In this test, X-rays are taken after a white chalky substance called barium is put into the colon. The barium makes it easier to see problems because it shows up well on the X-rays.  Blood tests to rule out infections or other problems. How is this treated? There is no specific treatment to cure this condition. Treatment options may include:  Medicines.  Warm baths.  Relaxation techniques.  Gentle massage of the painful area.  Biofeedback. Follow these instructions at home:  Take over-the-counter and prescription medicines only as told by your health care provider.  Follow instructions from your health care provider about diet.  Follow instructions from your health care provider about rest and  physical activity.  Try warm baths, massaging the area, or progressive relaxation techniques as told by your health care provider.  Keep all follow-up visits as told by your health care provider. This is important. Contact a health care provider if:  You develop new symptoms.  Your pain does not get better as soon as it usually does. This information is not intended to replace advice given to you by your health care provider. Make sure you discuss any questions you have with your health care provider. Document Released: 05/14/2001 Document Revised: 01/25/2016 Document Reviewed: 11/14/2014 Elsevier Interactive Patient Education  2017 [employer].

## 2017-01-02 NOTE — Addendum Note (Signed)
Addended byShawnie Pons on: 01/02/2017 04:50 PM   Modules accepted: Orders

## 2017-01-02 NOTE — Progress Notes (Signed)
   Subjective:  Patient ID: Jill Phillips, female    DOB: 1955/09/10  Age: 61 y.o. MRN: [id number]  CC: Pain (rectum pain--going on for 4 mo--ref request for urologist?)   HPI Rectal pain: Ongoing for over 44months. No radiation.  Worsening pain Jill Phillips presents with persistent rectal pain. She was evaluated by GI and no cause related to GI could be found. She did not use diltiazem as prescribed due to cost. She is requesting for PA to be done. No improvement of pain with current pain mediations or OTC topical agents. She denies any new symptoms   ROS See HPI  Objective:  BP 122/74   Pulse 92   Temp 98.3 F (36.8 C)   Ht 5\' 4"  (1.626 m)   Wt 159 lb (72.1 kg)   SpO2 95%   BMI 27.29 kg/m   BP Readings from Last 3 Encounters:  01/02/17 122/74  12/26/16 136/85  12/25/16 (!) 80/56    Wt Readings from Last 3 Encounters:  01/02/17 159 lb (72.1 kg)  12/25/16 159 lb 12.8 oz (72.5 kg)  12/20/16 155 lb (70.3 kg)    Physical Exam  Constitutional: She is oriented to person, place, and time. No distress.  Cardiovascular: Normal rate.   Pulmonary/Chest: Effort normal.  Abdominal: Soft. She exhibits no distension.  Musculoskeletal: She exhibits no edema.  Neurological: She is alert and oriented to person, place, and time.  Skin: Skin is warm and dry.  Vitals reviewed.   Lab Results  Component Value Date   WBC 9.7 10/01/2016   HGB 12.7 10/01/2016   HCT 37.7 10/01/2016   PLT 263.0 10/01/2016   GLUCOSE 92 10/01/2016   CHOL 244 (H) 05/05/2014   TRIG 464 (H) 05/05/2014   HDL 53 05/05/2014   LDLDIRECT 145 (H) 05/06/2014   LDLCALC NOT CALC 05/05/2014   ALT 10 10/01/2016   AST 15 10/01/2016   NA 145 10/01/2016   K 3.8 10/01/2016   CL 108 10/01/2016   CREATININE 1.03 10/01/2016   BUN 17 10/01/2016   CO2 34 (H) 10/01/2016   TSH 0.50 10/01/2016   INR 0.87 05/26/2012   HGBA1C 6.2 10/01/2016    No results found.  Assessment & Plan:   Jill Phillips was seen today for  pain.  Diagnoses and all orders for this visit:  Rectal pain -     diltiazem 2 % GEL; Apply 1 application topically 2 (two) times daily. -     Ambulatory referral to Neurology   I am having Jill Phillips start on diltiazem. I am also having her maintain her acetaminophen, pravastatin, valACYclovir, VOLTAREN, hydrochlorothiazide, benazepril, omeprazole, naproxen, albuterol, venlafaxine XR, gabapentin, rOPINIRole, tiZANidine, clonazePAM, QUEtiapine, amLODipine, levothyroxine, carisoprodol, SPIRIVA HANDIHALER, fentaNYL, HYDROcodone-acetaminophen, and PROAIR HFA. We will continue to administer sodium chloride.  Meds ordered this encounter  Medications  . diltiazem 2 % GEL    Sig: Apply 1 application topically 2 (two) times daily.    Dispense:  30 g    Refill:  1    Order Specific Question:   Supervising Provider    Answer:   Cassandria Anger [[id number]]    Follow-up: No Follow-up on file.  Wilfred Lacy, NP

## 2017-01-08 ENCOUNTER — Telehealth: Payer: Self-pay | Admitting: Internal Medicine

## 2017-01-08 ENCOUNTER — Telehealth (INDEPENDENT_AMBULATORY_CARE_PROVIDER_SITE_OTHER): Payer: Self-pay | Admitting: Specialist

## 2017-01-08 NOTE — Telephone Encounter (Signed)
PT REQUESTED A CALL BACK ABOUT A PELVIC PROBLEM SHE HAS HAD FOR A LONG TIME, STATED DR. NITKA HAS SEEN HER FOR THIS BEFORE.   SHE WANTS TO KNOW IF HE CAN Eden HER FOR A SCAN. SHE DOESN'T KNOW WHAT KIND OF SCAN.  [phone]

## 2017-01-08 NOTE — Telephone Encounter (Signed)
Spoke with pt to inform.  

## 2017-01-08 NOTE — Telephone Encounter (Signed)
Please advise in charlotte's absence.

## 2017-01-08 NOTE — Telephone Encounter (Signed)
I reviewed GI's note and there is no GI cause.  No topical or suppository medication will likely help.  They thought it was likely orthopedic in nature - related to her spine.  She has been referred to neurology.  She should discuss pain management with her pain doctor

## 2017-01-08 NOTE — Telephone Encounter (Signed)
Pt states diltiazem 2 % GEL  Is not working.  Her pain is inside her rectum, not on the outside. Pt is using ice to freeze the area to get relief.   Would like something else prescribed.  [facility] on [address] in [place]

## 2017-01-10 ENCOUNTER — Other Ambulatory Visit (INDEPENDENT_AMBULATORY_CARE_PROVIDER_SITE_OTHER): Payer: Self-pay | Admitting: Specialist

## 2017-01-10 NOTE — Telephone Encounter (Signed)
I had ordered an unenhanced CT scan of the lumbar spine on 12/17/2016, can we check the status of this procedure,also I had talked to Dr. Beather Arbour of [facility] and he has going to review her MRI findings ventral to the thecal sac at L5-S1 and upper sacrum, no word back yet. I sent a referral to [place] [facility] for consultation concerning her last Lumbar MRI and the findings in this area. jen

## 2017-01-10 NOTE — Telephone Encounter (Signed)
I called and she has been back to PCP, PCP has advised that getting a CT Scan of her Pelvis and having a Neurologist to evaluate her-- but she is wanting to know if Dr. [name] will do it instead.  She states that she is in lot of pain.

## 2017-01-10 NOTE — Telephone Encounter (Incomplete)
I called and she has been back to PCP, PCP has advised that getting a CT Scan  having a Neurologist to evaluate her-- but she is wanting to know if Dr. [name] will do it

## 2017-01-13 NOTE — Telephone Encounter (Signed)
I called [phone] and spoke to the "reading room" she will get with Dr. Beather Arbour and have him to review scans and I gave her Dr. Otho Ket Cell to call and discuss this pt.  ----CT scan images and report was delievered today after I called and spoke with Larene Beach at [facility].

## 2017-01-16 ENCOUNTER — Other Ambulatory Visit: Payer: Self-pay | Admitting: Nurse Practitioner

## 2017-01-16 DIAGNOSIS — K6289 Other specified diseases of anus and rectum: Secondary | ICD-10-CM

## 2017-01-17 ENCOUNTER — Ambulatory Visit (INDEPENDENT_AMBULATORY_CARE_PROVIDER_SITE_OTHER): Payer: Medicare HMO | Admitting: Specialist

## 2017-01-17 ENCOUNTER — Other Ambulatory Visit: Payer: Self-pay | Admitting: Internal Medicine

## 2017-01-17 ENCOUNTER — Encounter (INDEPENDENT_AMBULATORY_CARE_PROVIDER_SITE_OTHER): Payer: Self-pay | Admitting: Specialist

## 2017-01-17 VITALS — BP 133/76 | HR 101 | Ht 64.0 in | Wt 159.0 lb

## 2017-01-17 DIAGNOSIS — M4724 Other spondylosis with radiculopathy, thoracic region: Secondary | ICD-10-CM | POA: Diagnosis not present

## 2017-01-17 DIAGNOSIS — M960 Pseudarthrosis after fusion or arthrodesis: Secondary | ICD-10-CM

## 2017-01-17 DIAGNOSIS — G8929 Other chronic pain: Secondary | ICD-10-CM | POA: Diagnosis not present

## 2017-01-17 DIAGNOSIS — R102 Pelvic and perineal pain: Secondary | ICD-10-CM

## 2017-01-17 DIAGNOSIS — M546 Pain in thoracic spine: Secondary | ICD-10-CM

## 2017-01-17 DIAGNOSIS — K6289 Other specified diseases of anus and rectum: Secondary | ICD-10-CM

## 2017-01-17 MED ORDER — HYDROCODONE-ACETAMINOPHEN 10-325 MG PO TABS: 1.0000 | ORAL_TABLET | ORAL | 0 refills | Status: DC | PRN

## 2017-01-17 NOTE — Patient Instructions (Addendum)
Avoid frequent bending and stooping  No lifting greater than 10 lbs. May use ice or moist heat for pain. Weight loss is of benefit. Handicap license is approved. MRI of the pelvis with contrast to assess sacrum and the sacral organs for a cause of deep pelvic and rectal pain, r/o sacral fracture and assess the areas of scar and soft tissue anterior or ventral to the thecal sac.  Avoid bending, stooping and avoid lifting weights greater than 10 lbs. Avoid prolong standing and walking. Order for a new walker with wheels. Surgery scheduling secretary Kandice Hams, will call you in the next week to schedule for surgery.  Surgery recommended is an exploration of the fusion pseudarthrosis at L2-3 with rearthrodesis, left T10-11 foramenotomy for nerve compression due to spurs. Take hydrocodone for for pain. Risk of surgery includes risk of infection 1 in 200 patients, bleeding 1/2% chance you would need a transfusion.   Risk to the nerves is one in 10,000. You will need to use a brace for 3 months and wean from the brace on the 4th month. Expect improved walking and standing tolerance. Expect relief of leg pain but numbness may persist depending on the length and degree of pressure that has been present.

## 2017-01-17 NOTE — Progress Notes (Addendum)
Office Visit Note   Patient: Jill Phillips           Date of Birth: May 27, 1956           MRN: [id number] Visit Date: 01/17/2017              Requested by: [name], MD [place], [place] [address] PCP: [name], MD   Assessment & Plan: Visit Diagnoses:  1. Pelvic pain   2. Rectal pain, chronic   3. Pseudarthrosis after fusion or arthrodesis   4. Pain in thoracic spine     Plan:Avoid frequent bending and stooping  No lifting greater than 10 lbs. May use ice or moist heat for pain. Weight loss is of benefit. Handicap license is approved. MRI of the pelvis with contrast to assess sacrum and the sacral organs for a cause of deep pelvic and rectal pain, r/o sacral fracture and assess the areas of scar and soft tissue anterior or ventral to the thecal sac.  Avoid bending, stooping and avoid lifting weights greater than 10 lbs. Avoid prolong standing and walking. Order for a new walker with wheels. Surgery scheduling secretary Kandice Hams, will call you in the next week to schedule for surgery.  Surgery recommended is an exploration of the fusion pseudarthrosis at L2-3 with rearthrodesis, left T10-11 foramenotomy for nerve compression due to spurs. Take hydrocodone for for pain. Risk of surgery includes risk of infection 1 in 200 patients, bleeding 1/2% chance you would need a transfusion.   Risk to the nerves is one in 10,000. You will need to use a brace for 3 months and wean from the brace on the 4th month. Expect improved walking and standing tolerance. Expect relief of leg pain but numbness may persist depending on the length and degree of pressure that has been present.   Follow-Up Instructions: Return in about 3 weeks (around 02/07/2017).   Orders:  No orders of the defined types were placed in this encounter.  No orders of the defined types were placed in this encounter.     Procedures: No procedures performed   Clinical Data: No additional  findings.   Subjective: Chief Complaint  Patient presents with  . Lower Back - Follow-up    CT scan reveiw    61 year old female with ongoing pain into her pelvis and rectal area. She reports spending 90% of her time in bed with an ice pack on her back due to pain. She is in excruiating pain in the sacral area, deep. She has trouble with moving her bowels. She is so ready for some type of surgery. She feels like her pain is worsening, it I increased her pain meds to qid she reports that this would be okay with her pain management. She sees Dr. Tessa Lerner and she reports that he would be okay with her receiving medication from me. She has read about the pudendal nerve and its contribution to sensation in the pelvic organs and is concerned that this may be a source of her pain.      Review of Systems  Constitutional: Negative.   HENT: Negative.   Eyes: Negative.   Respiratory: Negative.   Cardiovascular: Negative.   Gastrointestinal: Positive for rectal pain.  Endocrine: Negative.   Genitourinary: Positive for flank pain and pelvic pain.  Musculoskeletal: Positive for back pain and gait problem.  Skin: Negative.   Allergic/Immunologic: Negative.   Hematological: Negative.   Psychiatric/Behavioral: Negative.  Objective: Vital Signs: BP 133/76 (BP Location: Left Arm, Patient Position: Sitting)   Pulse (!) 101   Ht 5\' 4"  (1.626 m)   Wt 159 lb (72.1 kg)   BMI 27.29 kg/m   Physical Exam  Constitutional: She is oriented to person, place, and time. She appears well-developed and well-nourished.  HENT:  Head: Normocephalic and atraumatic.  Eyes: EOM are normal. Pupils are equal, round, and reactive to light.  Neck: Normal range of motion. Neck supple.  Pulmonary/Chest: Effort normal and breath sounds normal.  Abdominal: Soft. Bowel sounds are normal.  Musculoskeletal: Normal range of motion.  Neurological: She is alert and oriented to person, place, and time.  Skin: Skin is  warm and dry.  Psychiatric: She has a normal mood and affect. Her behavior is normal. Judgment and thought content normal.    Back Exam   Tenderness  The patient is experiencing tenderness in the lumbar and sacroiliac.  Range of Motion  Extension: normal  Flexion: normal  Lateral Bend Right: normal  Lateral Bend Left: normal  Rotation Right: normal  Rotation Left: normal   Muscle Strength  Right Quadriceps:  5/5  Left Quadriceps:  5/5  Right Hamstrings:  5/5  Left Hamstrings:  5/5   Tests  Straight leg raise right: negative Straight leg raise left: negative  Reflexes  Patellar: normal Achilles: normal Babinski's sign: normal   Other  Toe Walk: normal Heel Walk: normal Sensation: normal Gait: normal  Scars: present      Specialty Comments:  No specialty comments available.  Imaging: No results found.   PMFS History: Patient Active Problem List   Diagnosis Date Noted  . Degenerative disc disease, lumbar 08/01/2015    Priority: High    Class: Chronic  . Spondylolisthesis of lumbar region 08/01/2015    Priority: High    Class: Chronic  . CKD (chronic kidney disease) stage 3, GFR 30-59 ml/min 08/07/2016  . GERD (gastroesophageal reflux disease) 08/07/2016  . Prediabetes 08/07/2016  . Chronic respiratory failure ([place]) 07/29/2016  . Hypersomnia 07/29/2016  . Arthritis of carpometacarpal [facility]) joint of left thumb 07/09/2016  . Lump of axilla 05/02/2016  . Pneumothorax on left 01/31/2016  . Chronic, continuous use of opioids 09/06/2015  . Urinary, incontinence, stress female 05/06/2014  . Constipation 05/05/2014  . 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 HTN (hypertension) 05/19/2013  . HLD (hyperlipidemia) 05/19/2013  . Depression 05/19/2013  . Hypothyroidism 05/19/2013  . Tobacco abuse   . Osteoarthritis of both knees 11/18/2011  . RLS (restless legs syndrome) 11/18/2011   Past Medical History:  Diagnosis Date  . Active smoker   . Anxiety   . Calcifying  tendinitis of shoulder   . Chronic back pain   . Chronic pain syndrome   . COPD (chronic obstructive pulmonary disease) ([place])   . Dysthymic disorder   . Emphysema lung ([place])   . GERD (gastroesophageal reflux disease)   . Headache    "weekly maybe" (01/31/2016)  . Heart murmur    for years, nothing to be concerned about  . Herpes genitalia   . History of blood transfusion 1980   related to "back surgery"  . Hyperlipidemia   . Hypertension   . Hypothyroidism   . Lumbago   . Osteoarthrosis, unspecified whether generalized or localized, lower leg   . Pain in joint, upper arm   . Pneumothorax, left 01/31/2016   S/P Left posterior subcostal pain injection on 01/30/2016  . PONV (postoperative nausea and vomiting)  gets nauseous  with longer surgery. Difficuty voiding after surgery  . Postlaminectomy syndrome, thoracic region   . Primary localized osteoarthrosis, lower leg   . Restless leg syndrome   . Sleep apnea    s/p surgery- last sleep study 2011- doesnt use oxygen or machine at night as instructed,.   12/2014- Dr Halford Chessman  reports it is negative.  . Thyroid disease     Family History  Problem Relation Age of Onset  . Kidney disease Mother   . Heart disease Father   . Anuerysm Brother 29       brain  . Heart disease Brother   . Heart disease Sister 25       s/p CABG  . Hypertension Sister   . Colon cancer Neg Hx     Past Surgical History:  Procedure Laterality Date  . APPENDECTOMY    . BACK SURGERY     18 back surgeries (2 thoracic & 16 lumbar) (01/31/2016)  . DILATION AND CURETTAGE OF UTERUS    . HAMMER TOE SURGERY    . JOINT REPLACEMENT    . KNEE ARTHROSCOPY Right   . LAPAROSCOPIC CHOLECYSTECTOMY    . LUMBAR FUSION N/A 08/01/2015   Procedure: Right sided L1-2 and L2-3 transforaminal lumbar interbody fusion with cages, Extension of posterior fusion T12 to L3, Replaced pedicle screws bilaterally L1-L2 , Replaced left sided pedicle screws L-3. Instrumentation T12 to L3  using local bone graft, Vivigen allograft and cancellous chips;  Surgeon: [name], MD;  Location: [facility];  Service: Orthopedics;  Laterality: N/A;  . LUMBAR LAMINECTOMY/DECOMPRESSION MICRODISCECTOMY N/A 01/28/2014   Procedure: Minimally Invasive Right  L1-2 Microdiscectomy;  Surgeon: [name], MD;  Location: [facility];  Service: Orthopedics;  Laterality: N/A;  . TOTAL HIP ARTHROPLASTY Right   . TOTAL KNEE ARTHROPLASTY  05/29/2012   Procedure: TOTAL KNEE ARTHROPLASTY;  Surgeon: Mcarthur Rossetti, MD;  Location: [facility];  Service: Orthopedics;  Laterality: Right;  Right Total Knee Arthroplasty  . TUBAL LIGATION    . UVULOPALATOPHARYNGOPLASTY    . VAGINAL HYSTERECTOMY     Social History   Occupational History  . disability     back surgeries   Social History Main Topics  . Smoking status: Current Every Day Smoker    Packs/day: 1.25    Years: 45.00    Types: Cigarettes  . Smokeless tobacco: Never Used     Comment: form given 12/25/16  . Alcohol use No  . Drug use: No     Comment: 01/31/2016 "none since ~ 1980"  . Sexual activity: No

## 2017-01-20 ENCOUNTER — Encounter (HOSPITAL_COMMUNITY): Payer: Self-pay | Admitting: Emergency Medicine

## 2017-01-20 ENCOUNTER — Emergency Department (HOSPITAL_COMMUNITY)
Admission: EM | Admit: 2017-01-20 | Discharge: 2017-01-20 | Disposition: A | Discharge status: Home / Self Care | Payer: Medicare HMO | Attending: Emergency Medicine | Admitting: Emergency Medicine

## 2017-01-20 DIAGNOSIS — Y999 Unspecified external cause status: Secondary | ICD-10-CM | POA: Insufficient documentation

## 2017-01-20 DIAGNOSIS — E039 Hypothyroidism, unspecified: Secondary | ICD-10-CM | POA: Insufficient documentation

## 2017-01-20 DIAGNOSIS — Z96641 Presence of right artificial hip joint: Secondary | ICD-10-CM | POA: Diagnosis not present

## 2017-01-20 DIAGNOSIS — F17210 Nicotine dependence, cigarettes, uncomplicated: Secondary | ICD-10-CM | POA: Diagnosis not present

## 2017-01-20 DIAGNOSIS — S61451A Open bite of right hand, initial encounter: Secondary | ICD-10-CM | POA: Diagnosis not present

## 2017-01-20 DIAGNOSIS — Y929 Unspecified place or not applicable: Secondary | ICD-10-CM | POA: Insufficient documentation

## 2017-01-20 DIAGNOSIS — I129 Hypertensive chronic kidney disease with stage 1 through stage 4 chronic kidney disease, or unspecified chronic kidney disease: Secondary | ICD-10-CM | POA: Diagnosis not present

## 2017-01-20 DIAGNOSIS — N183 Chronic kidney disease, stage 3 (moderate): Secondary | ICD-10-CM | POA: Insufficient documentation

## 2017-01-20 DIAGNOSIS — T148XXA Other injury of unspecified body region, initial encounter: Secondary | ICD-10-CM

## 2017-01-20 DIAGNOSIS — J449 Chronic obstructive pulmonary disease, unspecified: Secondary | ICD-10-CM | POA: Diagnosis not present

## 2017-01-20 DIAGNOSIS — W5501XA Bitten by cat, initial encounter: Secondary | ICD-10-CM | POA: Diagnosis not present

## 2017-01-20 DIAGNOSIS — Y939 Activity, unspecified: Secondary | ICD-10-CM | POA: Diagnosis not present

## 2017-01-20 DIAGNOSIS — S61431A Puncture wound without foreign body of right hand, initial encounter: Secondary | ICD-10-CM | POA: Diagnosis not present

## 2017-01-20 MED ORDER — FLUCONAZOLE 200 MG PO TABS: 200.0000 mg | ORAL_TABLET | Freq: Every day | ORAL | 0 refills | Status: AC

## 2017-01-20 MED ORDER — AMOXICILLIN-POT CLAVULANATE 875-125 MG PO TABS: 1.0000 | ORAL_TABLET | Freq: Two times a day (BID) | ORAL | 0 refills | Status: DC

## 2017-01-20 NOTE — Telephone Encounter (Signed)
Seen at appointment and MRI of the pelvis ordered. jen

## 2017-01-20 NOTE — Discharge Instructions (Signed)
Return here at once for fever, red streaks going up the arm, drainage from the wound, or any other problems.  Use Tylenol and/or Motrin as needed for pain

## 2017-01-20 NOTE — ED Triage Notes (Signed)
Pt complaint of right hand pain, redness and swelling noted, post cat biting her yesterday while attempting to give it a bath. Cat is pt's cat but not up to date on rabies vaccine. Two puncture marks noted to right hand.

## 2017-01-20 NOTE — ED Provider Notes (Signed)
[facility] DEPT Provider Note   CSN: [id number] Arrival date & time: 01/20/17  1133     History   Chief Complaint Chief Complaint  Patient presents with  . Animal Bite    HPI Jill Phillips is a 61 y.o. female.  61 year old female complaining of right hand pain that started after she has been by her cat. Patient stated that she was trying to bathe her cat when he Reached back and bit her yesterday. Denies any fever or chills. Has up-to-date on his rabies vaccine. No pus drainage from the wound. No red streaks going up the arm. Denies any numbness or tingling to her agents. Has used local wound care without success.      Past Medical History:  Diagnosis Date  . Active smoker   . Anxiety   . Calcifying tendinitis of shoulder   . Chronic back pain   . Chronic pain syndrome   . COPD (chronic obstructive pulmonary disease) ([place])   . Dysthymic disorder   . Emphysema lung ([place])   . GERD (gastroesophageal reflux disease)   . Headache    "weekly maybe" (01/31/2016)  . Heart murmur    for years, nothing to be concerned about  . Herpes genitalia   . History of blood transfusion 1980   related to "back surgery"  . Hyperlipidemia   . Hypertension   . Hypothyroidism   . Lumbago   . Osteoarthrosis, unspecified whether generalized or localized, lower leg   . Pain in joint, upper arm   . Pneumothorax, left 01/31/2016   S/P Left posterior subcostal pain injection on 01/30/2016  . PONV (postoperative nausea and vomiting)    gets nauseous  with longer surgery. Difficuty voiding after surgery  . Postlaminectomy syndrome, thoracic region   . Primary localized osteoarthrosis, lower leg   . Restless leg syndrome   . Sleep apnea    s/p surgery- last sleep study 2011- doesnt use oxygen or machine at night as instructed,.   12/2014- Dr Halford Chessman  reports it is negative.  . Thyroid disease     Patient Active Problem List   Diagnosis Date Noted  . CKD (chronic kidney disease) stage 3, GFR  30-59 ml/min 08/07/2016  . GERD (gastroesophageal reflux disease) 08/07/2016  . Prediabetes 08/07/2016  . Chronic respiratory failure ([place]) 07/29/2016  . Hypersomnia 07/29/2016  . Arthritis of carpometacarpal [facility]) joint of left thumb 07/09/2016  . Lump of axilla 05/02/2016  . Pneumothorax on left 01/31/2016  . Chronic, continuous use of opioids 09/06/2015  . Degenerative disc disease, lumbar 08/01/2015    Class: Chronic  . Spondylolisthesis of lumbar region 08/01/2015    Class: Chronic  . Urinary, incontinence, stress female 05/06/2014  . Constipation 05/05/2014  . 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 HTN (hypertension) 05/19/2013  . HLD (hyperlipidemia) 05/19/2013  . Depression 05/19/2013  . Hypothyroidism 05/19/2013  . Tobacco abuse   . Osteoarthritis of both knees 11/18/2011  . RLS (restless legs syndrome) 11/18/2011    Past Surgical History:  Procedure Laterality Date  . APPENDECTOMY    . BACK SURGERY     18 back surgeries (2 thoracic & 16 lumbar) (01/31/2016)  . DILATION AND CURETTAGE OF UTERUS    . HAMMER TOE SURGERY    . JOINT REPLACEMENT    . KNEE ARTHROSCOPY Right   . LAPAROSCOPIC CHOLECYSTECTOMY    . LUMBAR FUSION N/A 08/01/2015   Procedure: Right sided L1-2 and L2-3 transforaminal lumbar interbody fusion with cages, Extension of posterior fusion  T12 to L3, Replaced pedicle screws bilaterally L1-L2 , Replaced left sided pedicle screws L-3. Instrumentation T12 to L3 using local bone graft, Vivigen allograft and cancellous chips;  Surgeon: [name], MD;  Location: [facility];  Service: Orthopedics;  Laterality: N/A;  . LUMBAR LAMINECTOMY/DECOMPRESSION MICRODISCECTOMY N/A 01/28/2014   Procedure: Minimally Invasive Right  L1-2 Microdiscectomy;  Surgeon: [name], MD;  Location: [facility];  Service: Orthopedics;  Laterality: N/A;  . TOTAL HIP ARTHROPLASTY Right   . TOTAL KNEE ARTHROPLASTY  05/29/2012   Procedure: TOTAL KNEE ARTHROPLASTY;  Surgeon: Mcarthur Rossetti, MD;   Location: [facility];  Service: Orthopedics;  Laterality: Right;  Right Total Knee Arthroplasty  . TUBAL LIGATION    . UVULOPALATOPHARYNGOPLASTY    . VAGINAL HYSTERECTOMY      OB History    No data available       Home Medications      Family History Family History  Problem Relation Age of Onset  . Kidney disease Mother   . Heart disease Father   . Anuerysm Brother 29       brain  . Heart disease Brother   . Heart disease Sister 42       s/p CABG  . Hypertension Sister   . Colon cancer Neg Hx     Social History Social History  Substance Use Topics  . Smoking status: Current Every Day Smoker    Packs/day: 1.25    Years: 45.00    Types: Cigarettes  . Smokeless tobacco: Never Used     Comment: form given 12/25/16  . Alcohol use No     Allergies   Flagyl [metronidazole]; Sulfa antibiotics; Amoxicillin; Chlorzoxazone; Codeine; Darvocet [propoxyphene n-acetaminophen]; Dilaudid [hydromorphone hcl]; Keflex [cephalexin]; Morphine and related; Nitrofurantoin monohyd macro; and Percocet [oxycodone-acetaminophen]   Review of Systems Review of Systems  All other systems reviewed and are negative.    Physical Exam Updated Vital Signs BP (!) 132/92   Pulse 98   Temp 98.4 F (36.9 C) (Oral)   Resp 18   Ht 1.626 m (5\' 4" )   Wt 71.7 kg (158 lb)   SpO2 96%   BMI 27.12 kg/m   Physical Exam  Constitutional: She is oriented to person, place, and time. She appears well-developed and well-nourished.  Non-toxic appearance. No distress.  HENT:  Head: Normocephalic and atraumatic.  Eyes: Conjunctivae, EOM and lids are normal. Pupils are equal, round, and reactive to light.  Neck: Normal range of motion. Neck supple. No tracheal deviation present. No thyroid mass present.  Cardiovascular: Normal rate, regular rhythm and normal heart sounds.  Exam reveals no gallop.   No murmur heard. Pulmonary/Chest: Effort normal and breath sounds normal. No stridor. No respiratory distress.  She has no decreased breath sounds. She has no wheezes. She has no rhonchi. She has no rales.  Abdominal: Soft. Normal appearance and bowel sounds are normal. She exhibits no distension. There is no tenderness. There is no rebound and no CVA tenderness.  Musculoskeletal: Normal range of motion. She exhibits no edema or tenderness.       Hands: Neurological: She is alert and oriented to person, place, and time. She has normal strength. No cranial nerve deficit or sensory deficit. GCS eye subscore is 4. GCS verbal subscore is 5. GCS motor subscore is 6.  Skin: Skin is warm and dry. No abrasion and no rash noted.  Psychiatric: She has a normal mood and affect. Her speech is normal and behavior is  normal.  Nursing note and vitals reviewed.    ED Treatments / Results  Labs (all labs ordered are listed, but only abnormal results are displayed) Labs Reviewed - No data to display  EKG  EKG Interpretation None       Radiology No results found.  Procedures Procedures (including critical care time)  Medications Ordered in ED Medications - No data to display   Initial Impression / Assessment and Plan / ED Course  I have reviewed the triage vital signs and the nursing notes.  Pertinent labs & imaging results that were available during my care of the patient were reviewed by me and considered in my medical decision making (see chart for details).     Patient to be placed on Augmentin and given strict return precautions  Final Clinical Impressions(s) / ED Diagnoses   Final diagnoses:  None    New Prescriptions New Prescriptions   No medications on file     Lacretia [name], MD 01/20/17 1222

## 2017-01-20 NOTE — ED Notes (Signed)
Bed: WA02 Expected date:  Expected time:  Means of arrival:  Comments: 

## 2017-01-20 NOTE — Telephone Encounter (Signed)
ok 

## 2017-01-23 ENCOUNTER — Other Ambulatory Visit (INDEPENDENT_AMBULATORY_CARE_PROVIDER_SITE_OTHER): Payer: Self-pay | Admitting: Specialist

## 2017-01-23 ENCOUNTER — Encounter: Payer: Medicare HMO | Attending: Physical Medicine & Rehabilitation | Admitting: Registered Nurse

## 2017-01-23 ENCOUNTER — Telehealth (INDEPENDENT_AMBULATORY_CARE_PROVIDER_SITE_OTHER): Payer: Self-pay | Admitting: Radiology

## 2017-01-23 ENCOUNTER — Encounter: Payer: Self-pay | Admitting: Registered Nurse

## 2017-01-23 VITALS — BP 114/72 | HR 97

## 2017-01-23 DIAGNOSIS — M5126 Other intervertebral disc displacement, lumbar region: Secondary | ICD-10-CM | POA: Insufficient documentation

## 2017-01-23 DIAGNOSIS — R102 Pelvic and perineal pain: Secondary | ICD-10-CM

## 2017-01-23 DIAGNOSIS — G2581 Restless legs syndrome: Secondary | ICD-10-CM | POA: Diagnosis not present

## 2017-01-23 DIAGNOSIS — M791 Myalgia: Secondary | ICD-10-CM

## 2017-01-23 DIAGNOSIS — E785 Hyperlipidemia, unspecified: Secondary | ICD-10-CM | POA: Diagnosis not present

## 2017-01-23 DIAGNOSIS — M4316 Spondylolisthesis, lumbar region: Secondary | ICD-10-CM

## 2017-01-23 DIAGNOSIS — J449 Chronic obstructive pulmonary disease, unspecified: Secondary | ICD-10-CM | POA: Diagnosis not present

## 2017-01-23 DIAGNOSIS — M5136 Other intervertebral disc degeneration, lumbar region: Secondary | ICD-10-CM | POA: Diagnosis not present

## 2017-01-23 DIAGNOSIS — I10 Essential (primary) hypertension: Secondary | ICD-10-CM | POA: Diagnosis not present

## 2017-01-23 DIAGNOSIS — Z5181 Encounter for therapeutic drug level monitoring: Secondary | ICD-10-CM | POA: Diagnosis not present

## 2017-01-23 DIAGNOSIS — M170 Bilateral primary osteoarthritis of knee: Secondary | ICD-10-CM | POA: Insufficient documentation

## 2017-01-23 DIAGNOSIS — K219 Gastro-esophageal reflux disease without esophagitis: Secondary | ICD-10-CM | POA: Diagnosis not present

## 2017-01-23 DIAGNOSIS — Z9981 Dependence on supplemental oxygen: Secondary | ICD-10-CM | POA: Diagnosis not present

## 2017-01-23 DIAGNOSIS — E079 Disorder of thyroid, unspecified: Secondary | ICD-10-CM | POA: Diagnosis not present

## 2017-01-23 DIAGNOSIS — Z9889 Other specified postprocedural states: Secondary | ICD-10-CM | POA: Insufficient documentation

## 2017-01-23 DIAGNOSIS — Z79899 Other long term (current) drug therapy: Secondary | ICD-10-CM | POA: Diagnosis not present

## 2017-01-23 DIAGNOSIS — G9619 Other disorders of meninges, not elsewhere classified: Secondary | ICD-10-CM | POA: Diagnosis not present

## 2017-01-23 DIAGNOSIS — E039 Hypothyroidism, unspecified: Secondary | ICD-10-CM | POA: Insufficient documentation

## 2017-01-23 DIAGNOSIS — Z720 Tobacco use: Secondary | ICD-10-CM | POA: Insufficient documentation

## 2017-01-23 DIAGNOSIS — M2170 Unequal limb length (acquired), unspecified site: Secondary | ICD-10-CM | POA: Insufficient documentation

## 2017-01-23 DIAGNOSIS — M546 Pain in thoracic spine: Secondary | ICD-10-CM

## 2017-01-23 DIAGNOSIS — G894 Chronic pain syndrome: Secondary | ICD-10-CM | POA: Insufficient documentation

## 2017-01-23 DIAGNOSIS — K6289 Other specified diseases of anus and rectum: Secondary | ICD-10-CM

## 2017-01-23 DIAGNOSIS — G8929 Other chronic pain: Secondary | ICD-10-CM

## 2017-01-23 DIAGNOSIS — M7918 Myalgia, other site: Secondary | ICD-10-CM

## 2017-01-23 DIAGNOSIS — M961 Postlaminectomy syndrome, not elsewhere classified: Secondary | ICD-10-CM | POA: Diagnosis not present

## 2017-01-23 DIAGNOSIS — M960 Pseudarthrosis after fusion or arthrodesis: Secondary | ICD-10-CM

## 2017-01-23 MED ORDER — FENTANYL 50 MCG/HR TD PT72
50.0000 ug | MEDICATED_PATCH | TRANSDERMAL | 0 refills | Status: DC
Start: 2017-01-23 — End: 2017-02-19

## 2017-01-23 MED ORDER — HYDROCODONE-ACETAMINOPHEN 10-325 MG PO TABS
1.0000 | ORAL_TABLET | Freq: Four times a day (QID) | ORAL | 0 refills | Status: DC | PRN
Start: 2017-01-23 — End: 2017-03-03

## 2017-01-23 NOTE — Progress Notes (Signed)
Subjective:    Patient ID: Jill Phillips, female    DOB: [birth year], 61 y.o.   MRN: [id number]  HPI: [name] is a 61 year old female who returns for follow up appointmentfor chronic pain and medication refill. She states her pain is located in her mid-lower back and rectal pain.She rates her pain 7.Her current exercise regime is walking and performing stretching exercises.  [name] seen Dr. [name] on 01/17/2017, his progress note was reviewed, she was having increase intensity of pain. Dr. [name] prescribed her Hydrocodone 50 tablets, she picked them up on 01/20/2017. She picked up our prescription on 12/26/2016.  [name] states Dr. [name] will be prescribing her Hydrocodone, I placed a call to Dr. [name] office ( [place]), spoke with Abigail Butts. She will send a message to Dr. [name] and return our call. I reviewed the Narcotic Poliicy with [name] and her daughter and if this occurs again can lead to her being discharge. She verbalizes understanding.  Awaiting on Dr. [name] call.   [name] states she received a cat bite on her right hand, she went to [facility] ED on 01/20/2017. She was prescribed Augmentin and Diflucan.   Daughter in the room.  Last UDS was on 11/28/2016, it was consistent.   Pain Inventory Average Pain 7 Pain Right Now 7 My pain is burning, dull and aching  In the last 24 hours, has pain interfered with the following? General activity 10 Relation with others 10 Enjoyment of life 10 What TIME of day is your pain at its worst? . Sleep (in general) .  Pain is worse with: . Pain improves with: . Relief from Meds: .  Mobility walk without assistance  Function disabled: date disabled . I need assistance with the following:  dressing, bathing, meal prep, household duties and shopping  Neuro/Psych No problems in this area  Prior Studies Any changes since last visit?  no  Physicians involved in your care Any changes since last  visit?  no   Family History  Problem Relation Age of Onset  . Kidney disease Mother   . Heart disease Father   . Anuerysm Brother 29       brain  . Heart disease Brother   . Heart disease Sister 57       s/p CABG  . Hypertension Sister   . Colon cancer Neg Hx    Social History   Social History  . Marital status: Divorced    Spouse name: n/a  . Number of children: 2  . Years of education: 12+   Occupational History  . disability     back surgeries   Social History Main Topics  . Smoking status: Current Every Day Smoker    Packs/day: 1.25    Years: 45.00    Types: Cigarettes  . Smokeless tobacco: Never Used     Comment: form given 12/25/16  . Alcohol use No  . Drug use: No     Comment: 01/31/2016 "none since ~ 1980"  . Sexual activity: No   Other Topics Concern  . None   Social History Narrative   Lives alone.  One daughter is local, but is getting ready to move to [place], where her children live with their father.  The other daughter lives near [place], [place].   Past Surgical History:  Procedure Laterality Date  . APPENDECTOMY    . BACK SURGERY     18 back surgeries (2 thoracic &  16 lumbar) (01/31/2016)  . DILATION AND CURETTAGE OF UTERUS    . HAMMER TOE SURGERY    . JOINT REPLACEMENT    . KNEE ARTHROSCOPY Right   . LAPAROSCOPIC CHOLECYSTECTOMY    . LUMBAR FUSION N/A 08/01/2015   Procedure: Right sided L1-2 and L2-3 transforaminal lumbar interbody fusion with cages, Extension of posterior fusion T12 to L3, Replaced pedicle screws bilaterally L1-L2 , Replaced left sided pedicle screws L-3. Instrumentation T12 to L3 using local bone graft, Vivigen allograft and cancellous chips;  Surgeon: [name], MD;  Location: [facility];  Service: Orthopedics;  Laterality: N/A;  . LUMBAR LAMINECTOMY/DECOMPRESSION MICRODISCECTOMY N/A 01/28/2014   Procedure: Minimally Invasive Right  L1-2 Microdiscectomy;  Surgeon: [name], MD;  Location: [facility];  Service: Orthopedics;   Laterality: N/A;  . TOTAL HIP ARTHROPLASTY Right   . TOTAL KNEE ARTHROPLASTY  05/29/2012   Procedure: TOTAL KNEE ARTHROPLASTY;  Surgeon: Mcarthur Rossetti, MD;  [facility];  Service: Orthopedics;  Laterality: Right;  Right Total Knee Arthroplasty  . TUBAL LIGATION    . UVULOPALATOPHARYNGOPLASTY    . VAGINAL HYSTERECTOMY     Past Medical History:  Diagnosis Date  . Active smoker   . Anxiety   . Calcifying tendinitis of shoulder   . Chronic back pain   . Chronic pain syndrome   . COPD (chronic obstructive pulmonary disease) ([place])   . Dysthymic disorder   . Emphysema lung ([place])   . GERD (gastroesophageal reflux disease)   . Headache    "weekly maybe" (01/31/2016)  . Heart murmur    for years, nothing to be concerned about  . Herpes genitalia   . History of blood transfusion 1980   related to "back surgery"  . Hyperlipidemia   . Hypertension   . Hypothyroidism   . Lumbago   . Osteoarthrosis, unspecified whether generalized or localized, lower leg   . Pain in joint, upper arm   . Pneumothorax, left 01/31/2016   S/P Left posterior subcostal pain injection on 01/30/2016  . PONV (postoperative nausea and vomiting)    gets nauseous  with longer surgery. Difficuty voiding after surgery  . Postlaminectomy syndrome, thoracic region   . Primary localized osteoarthrosis, lower leg   . Restless leg syndrome   . Sleep apnea    s/p surgery- last sleep study 2011- doesnt use oxygen or machine at night as instructed,.   12/2014- Dr Halford Chessman  reports it is negative.  . Thyroid disease    BP 114/72   Pulse 97   Opioid Risk Score:   Fall Risk Score:  `1  Depression screen PHQ 2/9  Depression screen [facility] 2/9 05/15/2016 05/02/2016 12/07/2015 11/15/2015 10/13/2015 09/06/2015 05/22/2015  Decreased Interest 0 0 0 0 0 0 0  Down, Depressed, Hopeless 0 0 0 0 - 0 0  PHQ - 2 Score 0 0 0 0 0 0 0  Altered sleeping - - - - - - -  Tired, decreased energy - - - - - - -  Change in appetite - - - - - - -    Feeling bad or failure about yourself  - - - - - - -  Trouble concentrating - - - - - - -  Moving slowly or fidgety/restless - - - - - - -  Suicidal thoughts - - - - - - -  PHQ-9 Score - - - - - - -  Some recent data might be hidden    Review  of Systems  Constitutional: Positive for unexpected weight change.  HENT: Negative.   Eyes: Negative.   Respiratory: Negative.   Cardiovascular: Negative.   Gastrointestinal: Negative.   Endocrine: Negative.   Genitourinary: Negative.   Musculoskeletal: Negative.   Skin: Negative.   Allergic/Immunologic: Negative.   Neurological: Negative.   Hematological: Negative.   Psychiatric/Behavioral: Negative.   All other systems reviewed and are negative.      Objective:   Physical Exam  Constitutional: She is oriented to person, place, and time. She appears well-developed and well-nourished.  HENT:  Head: Normocephalic and atraumatic.  Neck: Normal range of motion. Neck supple.  Cardiovascular: Normal rate and regular rhythm.   Pulmonary/Chest: Effort normal and breath sounds normal.  Musculoskeletal:  Normal Muscle Bulk and Muscle Testing Reveals: Upper Extremities: Full ROM and Muscle Strength 5/5 Thoracic Paraspinal Tenderness: T-7-T-9 Lumbar Paraspinal Tenderness: L-3-L-5 Lower Extremities: Full ROM and Muscle Strength 5/5 Arises from Table with Ease Narrow Based Gait  Neurological: She is alert and oriented to person, place, and time.  Skin: Skin is warm and dry.  Psychiatric: She has a normal mood and affect.  Nursing note and vitals reviewed.         Assessment & Plan:  1. Chronic lumbar spine pain/post-lami syndrome: 01/23/2017 Refilled: Fentanyl 50 MCG one patch every three days #10 and Hydrocodone 10/325 mg one tablet every 6 hours as needed for pain #120. We will continue the opioid monitoring program, this consists of regular clinic visits, examinations, urine drug screen, pill counts as well as use of [place] Controlled Substance Reporting System. 2. Chronic Bilateral Thoracic Pain: Continue current medication regime.01/23/17 3. Osteoarthritis of the knees bilaterally:No complaints today. Continue with Heat, exercise and voltaren gel. 01/23/2017 4. Rotator cuff syndrome/subacromial bursitis: No complaints voiced today. 01/23/2017 5. Restless legs syndrome: Continue Requip.Continue to monitor. 01/23/2017 6. Tobacco Abuse: Encourage Smoking Cessation: Pulmonologist Following. 01/23/2017 7. Emphysema: Pulmonology Following. 01/23/2017 8. Muscle Spasm: Continue Soma. 01/23/2017 9. Insomnia: Continue Seroquel. 01/23/2017 10. Neuropathic Pain: Continue Gabapentin.01/23/2017 11.Anxiety: Continue Klonopin. 01/23/2017  20 minutes of face to face patient care time was spent during this visit. All questions were encouraged and answered.   F/u in 1 month

## 2017-01-23 NOTE — Telephone Encounter (Signed)
Please see note from [facility]

## 2017-01-23 NOTE — Telephone Encounter (Signed)
I spoke with Zella Ball and she says that patient was in their office today to get her pain Rx.  She is under pain contract with them.  Dr [name] prescribed hydrocodone for patient on 01/17/17.  Our office did not call them to inquire on this, and since patient is under pain contract with them, this is a violation of that contract and she could be discharged.  There is mention in the chart note from Dr [name] that patient stated that Dr Naaman Plummer would not mind if Dr [name] gave her a Rx.  Per Zella Ball, they need to know if Dr [name] has intentions to take over/continue to give patient pain meds. Zella Ball last gave patient Rx hydrocodone #120 on 12/26/16, and will go ahead and give her a new Rx today.  But she still needs a phone call with clarification.  Please call today at her request. Thanks.

## 2017-01-23 NOTE — Telephone Encounter (Signed)
Rx refill Hydrocodone °

## 2017-01-24 ENCOUNTER — Other Ambulatory Visit: Payer: Self-pay | Admitting: *Deleted

## 2017-01-24 ENCOUNTER — Telehealth: Payer: Self-pay | Admitting: Registered Nurse

## 2017-01-24 ENCOUNTER — Other Ambulatory Visit (INDEPENDENT_AMBULATORY_CARE_PROVIDER_SITE_OTHER): Payer: Self-pay | Admitting: Specialist

## 2017-01-24 MED ORDER — TIZANIDINE HCL 4 MG PO TABS: ORAL_TABLET | ORAL | 3 refills | Status: DC

## 2017-01-24 MED ORDER — CLONAZEPAM 2 MG PO TABS: ORAL_TABLET | ORAL | 2 refills | Status: DC

## 2017-01-24 NOTE — Telephone Encounter (Signed)
Tizanidine refill request 

## 2017-01-24 NOTE — Telephone Encounter (Signed)
Dear  Danella Sensing, NP Mrs. Schiano was seen late Friday AM 10:30 01/17/2017 complaining of left thorax, bilateral buttock and low back pain and rectum pain. 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 Her hardware is intact. She is complaining of increasing discomfort, finding that she needs 4 hydrocodone tablets per day as opposed to the 3 tablets she is alloted. She insists that the pain management program is "okay" with me taking over her narcotic management during periods of acute pain or perioperatively. She also has concerns that her internal rectal pain is due to a pudendal nerve pain. She requested an MRI. 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 MRI was ordered I gave her enough medication to allow for her to take 4 tablets of hydocodone per day until she could be reevaluated in your office. I plan to schedule her for intervention for the left thoracic area and plan to explore and augment the upper lumbar fusion non union site but again I don't think this  Will treat her pelvic pain concern. I apologize for providing her with any narcotics, I know that contracts are contracts and we did discuss the concern. I do not plan to take over her pain management however I will prescribe medications Following her surgical procedure. Being in [facility] at least allows some ability to see prescriptions and I do frequent the  [place] Controlled Substance Web site to try to discern any concern of poly pharmacy. She is not an abuser and I prescribed this without discussing mainly due to time contraints. Need to be in the OR one hour late.  Basil Dess, MD.

## 2017-01-24 NOTE — Telephone Encounter (Signed)
I called and advised pt that rx was denied

## 2017-01-24 NOTE — Telephone Encounter (Signed)
To Dr. [name], I read your message. Thanks for responding.

## 2017-01-24 NOTE — Telephone Encounter (Signed)
Received a call from [name] stating Dr. [name] won't fill her Tizanidine. Placed a call to [place], [name] has been on Tizanidine. Tizanidine ordered. She verbalizes understanding.

## 2017-01-29 ENCOUNTER — Other Ambulatory Visit: Payer: Self-pay | Admitting: Adult Health

## 2017-01-29 LAB — TOXASSURE SELECT,+ANTIDEPR,UR

## 2017-01-31 ENCOUNTER — Telehealth: Payer: Self-pay | Admitting: *Deleted

## 2017-01-31 NOTE — Telephone Encounter (Signed)
Urine drug screen for this encounter is consistent for prescribed medication 

## 2017-02-03 ENCOUNTER — Other Ambulatory Visit (INDEPENDENT_AMBULATORY_CARE_PROVIDER_SITE_OTHER): Payer: Self-pay | Admitting: Orthopaedic Surgery

## 2017-02-05 ENCOUNTER — Other Ambulatory Visit: Payer: Self-pay | Admitting: Internal Medicine

## 2017-02-05 ENCOUNTER — Other Ambulatory Visit: Payer: Self-pay | Admitting: Physical Medicine & Rehabilitation

## 2017-02-05 ENCOUNTER — Ambulatory Visit: Payer: Medicare Other | Admitting: Internal Medicine

## 2017-02-05 DIAGNOSIS — I10 Essential (primary) hypertension: Secondary | ICD-10-CM

## 2017-02-05 NOTE — Progress Notes (Deleted)
Subjective:    Patient ID: Jill Phillips, female    DOB: 08/30/1956, 61 y.o.   MRN: [id number]  HPI The patient is here for follow up.  Hypertension: She is taking her medication daily. She is compliant with a low sodium diet.  She denies chest pain, palpitations, edema, shortness of breath and regular headaches. She is exercising regularly.  She does not monitor her blood pressure at home.    Hypothyroidism:  She is taking her medication daily.  She denies any recent changes in energy or weight that are unexplained.   GERD:  She is taking her medication daily as prescribed.  She denies any GERD symptoms and feels her GERD is well controlled.   CKD:  Prediabetes:  She is compliant with a low sugar/carbohydrate diet.  She is exercising regularly.  Hyperlipidemia: She is taking her medication daily. She is compliant with a low fat/cholesterol diet. She is exercising regularly. She denies myalgias.   Depression: She is taking her medication daily as prescribed. She denies any side effects from the medication. She feels her depression is well controlled and she is happy with her current dose of medication.   chronic pain:  She follows with pain management .   Medications and allergies reviewed with patient and updated if appropriate.  Patient Active Problem List   Diagnosis Date Noted  . CKD (chronic kidney disease) stage 3, GFR 30-59 ml/min 08/07/2016  . GERD (gastroesophageal reflux disease) 08/07/2016  . Prediabetes 08/07/2016  . Chronic respiratory failure ([place]) 07/29/2016  . Hypersomnia 07/29/2016  . Arthritis of carpometacarpal [facility]) joint of left thumb 07/09/2016  . Lump of axilla 05/02/2016  . Pneumothorax on left 01/31/2016  . Chronic, continuous use of opioids 09/06/2015  . Degenerative disc disease, lumbar 08/01/2015    Class: Chronic  . Spondylolisthesis of lumbar region 08/01/2015    Class: Chronic  . Urinary, incontinence, stress female 05/06/2014  .  Constipation 05/05/2014  . 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 HTN (hypertension) 05/19/2013  . HLD (hyperlipidemia) 05/19/2013  . Depression 05/19/2013  . Hypothyroidism 05/19/2013  . Tobacco abuse   . Osteoarthritis of both knees 11/18/2011  . RLS (restless legs syndrome) 11/18/2011    Current Outpatient Prescriptions on File Prior to Visit  Medication Sig Dispense Refill  . acetaminophen (TYLENOL) 500 MG tablet Take 1,000 mg by mouth every 6 (six) hours as needed for headache.     . albuterol (PROAIR HFA) 108 (90 Base) MCG/ACT inhaler INHALE 1 PUFF INTO THE LUNGS EVERY 6 HOURS AS NEEDED FOR WHEEZING OR SHORTNESS OF BREATH.NEED OFFICE VISIT FOR FURTHER REFILLS 8.5 g 0  . amLODipine (NORVASC) 10 MG tablet Take 1 tablet (10 mg total) by mouth daily. 90 tablet 0  . amoxicillin-clavulanate (AUGMENTIN) 875-125 MG tablet Take 1 tablet by mouth every 12 (twelve) hours. 20 tablet 0  . benazepril (LOTENSIN) 40 MG tablet TAKE 1 TABLET (40 MG TOTAL) BY MOUTH DAILY. 90 tablet 0  . carisoprodol (SOMA) 350 MG tablet TAKE 1 TABLET BY MOUTH 3 TIMES A DAY. 90 tablet 3  . clonazePAM (KLONOPIN) 2 MG tablet TAKE 1 TABLET BY MOUTH 2 TIMES A DAY AS NEEDED. 60 tablet 2  . diltiazem 2 % GEL Apply 1 application topically 2 (two) times daily. 15 g 1  . fentaNYL (DURAGESIC - DOSED MCG/HR) 50 MCG/HR Place 1 patch (50 mcg total) onto the skin every 3 (three) days. 10 patch 0  . gabapentin (NEURONTIN) 300 MG capsule TAKE 1  CAPSULE BY MOUTH 2 TIMES DAILY. 60 capsule 5  . hydrochlorothiazide (HYDRODIURIL) 12.5 MG tablet Take 1 tablet (12.5 mg total) by mouth daily. 90 tablet 1  . HYDROcodone-acetaminophen (NORCO) 10-325 MG tablet Take 1 tablet by mouth every 6 (six) hours as needed (breakthrough pain). 120 tablet 0  . levothyroxine (SYNTHROID, LEVOTHROID) 75 MCG tablet Take 1 tablet (75 mcg total) by mouth daily before breakfast. 30 tablet 5  . naproxen (NAPROSYN) 500 MG tablet TAKE 1 TABLET BY MOUTH TWICE A DAY WITH FOOD. 60  tablet 5  . omeprazole (PRILOSEC) 20 MG capsule TAKE 1 CAPSULE BY MOUTH 2 TIMES DAILY BEFORE A MEAL. 60 capsule 5  . pravastatin (PRAVACHOL) 40 MG tablet TAKE 1 TABLET BY MOUTH DAILY. 90 tablet PRN  . QUEtiapine (SEROQUEL) 100 MG tablet TAKE 1 TABLET BY MOUTH AT BEDTIME. 30 tablet 2  . rOPINIRole (REQUIP) 0.5 MG tablet TAKE 2 TABLETS BY MOUTH AT BEDTIME. 60 tablet 1  . SPIRIVA HANDIHALER 18 MCG inhalation capsule PLACE 1 CAPSULE INTO INHALER AND INHALE DAILY. 30 capsule 2  . tiZANidine (ZANAFLEX) 4 MG tablet TAKE 1 TABLET BY MOUTH EVERY 8 HOURS AS NEEDED FOR MUSCLE PAIN/ SPASMS 90 tablet 3  . valACYclovir (VALTREX) 500 MG tablet TAKE 1 TABLET BY MOUTH DAILY. 30 tablet 11  . venlafaxine XR (EFFEXOR-XR) 150 MG 24 hr capsule Take 2 capsules (300 mg total) by mouth daily. 60 capsule 5  . VENTOLIN HFA 108 (90 Base) MCG/ACT inhaler INHALE 1 PUFF INTO THE LUNGS EVERY 6 HOURS AS NEEDED FOR WHEEZING OR SHORTNESS OF BREATH.***NEED OFFICE VISIT FOR FURTHER REFILLS*** 18 g 0  . VOLTAREN 1 % GEL APPLY 2 GRAMS TO AFFECTED AREA 3 TIMES A DAY AS NEEDED FOR PAIN. 300 g 4   Current Facility-Administered Medications on File Prior to Visit  Medication Dose Route Frequency Provider Last Rate Last Dose  . 0.9 %  sodium chloride infusion  500 mL Intravenous Continuous Ladene Artist, MD        Past Medical History:  Diagnosis Date  . Active smoker   . Anxiety   . Calcifying tendinitis of shoulder   . Chronic back pain   . Chronic pain syndrome   . COPD (chronic obstructive pulmonary disease) ([place])   . Dysthymic disorder   . Emphysema lung ([place])   . GERD (gastroesophageal reflux disease)   . Headache    "weekly maybe" (01/31/2016)  . Heart murmur    for years, nothing to be concerned about  . Herpes genitalia   . History of blood transfusion 1980   related to "back surgery"  . Hyperlipidemia   . Hypertension   . Hypothyroidism   . Lumbago   . Osteoarthrosis, unspecified whether generalized or  localized, lower leg   . Pain in joint, upper arm   . Pneumothorax, left 01/31/2016   S/P Left posterior subcostal pain injection on 01/30/2016  . PONV (postoperative nausea and vomiting)    gets nauseous  with longer surgery. Difficuty voiding after surgery  . Postlaminectomy syndrome, thoracic region   . Primary localized osteoarthrosis, lower leg   . Restless leg syndrome   . Sleep apnea    s/p surgery- last sleep study 2011- doesnt use oxygen or machine at night as instructed,.   12/2014- Dr Halford Chessman  reports it is negative.  . Thyroid disease     Past Surgical History:  Procedure Laterality Date  . APPENDECTOMY    . BACK SURGERY  18 back surgeries (2 thoracic & 16 lumbar) (01/31/2016)  . DILATION AND CURETTAGE OF UTERUS    . HAMMER TOE SURGERY    . JOINT REPLACEMENT    . KNEE ARTHROSCOPY Right   . LAPAROSCOPIC CHOLECYSTECTOMY    . LUMBAR FUSION N/A 08/01/2015   Procedure: Right sided L1-2 and L2-3 transforaminal lumbar interbody fusion with cages, Extension of posterior fusion T12 to L3, Replaced pedicle screws bilaterally L1-L2 , Replaced left sided pedicle screws L-3. Instrumentation T12 to L3 using local bone graft, Vivigen allograft and cancellous chips;  Surgeon: [name], MD;  Location: [place];  Service: Orthopedics;  Laterality: N/A;  . LUMBAR LAMINECTOMY/DECOMPRESSION MICRODISCECTOMY N/A 01/28/2014   Procedure: Minimally Invasive Right  L1-2 Microdiscectomy;  Surgeon: [name], MD;  Location: [place];  Service: Orthopedics;  Laterality: N/A;  . TOTAL HIP ARTHROPLASTY Right   . TOTAL KNEE ARTHROPLASTY  05/29/2012   Procedure: TOTAL KNEE ARTHROPLASTY;  Surgeon: Mcarthur Rossetti, MD;  Location: [facility];  Service: Orthopedics;  Laterality: Right;  Right Total Knee Arthroplasty  . TUBAL LIGATION    . UVULOPALATOPHARYNGOPLASTY    . VAGINAL HYSTERECTOMY      Social History   Social History  . Marital status: Divorced    Spouse name: n/a  . Number of children: 2  .  Years of education: 12+   Occupational History  . disability     back surgeries   Social History Main Topics  . Smoking status: Current Every Day Smoker    Packs/day: 1.25    Years: 45.00    Types: Cigarettes  . Smokeless tobacco: Never Used     Comment: form given 12/25/16  . Alcohol use No  . Drug use: No     Comment: 01/31/2016 "none since ~ 1980"  . Sexual activity: No   Other Topics Concern  . Not on file   Social History Narrative   Lives alone.  One daughter is local, but is getting ready to move to [place], where her children live with their father.  The other daughter lives near [place], [place].    Family History  Problem Relation Age of Onset  . Kidney disease Mother   . Heart disease Father   . Anuerysm Brother 29       brain  . Heart disease Brother   . Heart disease Sister 82       s/p CABG  . Hypertension Sister   . Colon cancer Neg Hx     Review of Systems     Objective:  There were no vitals filed for this visit. Wt Readings from Last 3 Encounters:  01/20/17 158 lb (71.7 kg)  01/17/17 159 lb (72.1 kg)  01/02/17 159 lb (72.1 kg)   There is no height or weight on file to calculate BMI.   Physical Exam    Constitutional: Appears well-developed and well-nourished. No distress.  HENT:  Head: Normocephalic and atraumatic.  Neck: Neck supple. No tracheal deviation present. No thyromegaly present.  No cervical lymphadenopathy Cardiovascular: Normal rate, regular rhythm and normal heart sounds.   No murmur heard. No carotid bruit .  No edema Pulmonary/Chest: Effort normal and breath sounds normal. No respiratory distress. No has no wheezes. No rales.  Skin: Skin is warm and dry. Not diaphoretic.  Psychiatric: Normal mood and affect. Behavior is normal.      Assessment & Plan:    See Problem List for Assessment and Plan of chronic medical problems.

## 2017-02-10 ENCOUNTER — Other Ambulatory Visit: Payer: Self-pay | Admitting: Internal Medicine

## 2017-02-10 ENCOUNTER — Ambulatory Visit: Payer: Medicare HMO | Admitting: Physical Medicine & Rehabilitation

## 2017-02-10 DIAGNOSIS — I10 Essential (primary) hypertension: Secondary | ICD-10-CM

## 2017-02-11 ENCOUNTER — Emergency Department (HOSPITAL_COMMUNITY)
Admission: EM | Admit: 2017-02-11 | Discharge: 2017-02-12 | Disposition: A | Discharge status: Home / Self Care | Payer: Medicare HMO | Attending: Emergency Medicine | Admitting: Emergency Medicine

## 2017-02-11 ENCOUNTER — Encounter (HOSPITAL_COMMUNITY): Payer: Self-pay | Admitting: Emergency Medicine

## 2017-02-11 ENCOUNTER — Other Ambulatory Visit: Payer: Self-pay | Admitting: Internal Medicine

## 2017-02-11 ENCOUNTER — Emergency Department (HOSPITAL_COMMUNITY): Payer: Medicare HMO

## 2017-02-11 DIAGNOSIS — Z79899 Other long term (current) drug therapy: Secondary | ICD-10-CM | POA: Diagnosis not present

## 2017-02-11 DIAGNOSIS — R0602 Shortness of breath: Secondary | ICD-10-CM | POA: Diagnosis not present

## 2017-02-11 DIAGNOSIS — I10 Essential (primary) hypertension: Secondary | ICD-10-CM

## 2017-02-11 DIAGNOSIS — N183 Chronic kidney disease, stage 3 (moderate): Secondary | ICD-10-CM | POA: Insufficient documentation

## 2017-02-11 DIAGNOSIS — F17210 Nicotine dependence, cigarettes, uncomplicated: Secondary | ICD-10-CM | POA: Insufficient documentation

## 2017-02-11 DIAGNOSIS — E039 Hypothyroidism, unspecified: Secondary | ICD-10-CM | POA: Insufficient documentation

## 2017-02-11 DIAGNOSIS — J449 Chronic obstructive pulmonary disease, unspecified: Secondary | ICD-10-CM | POA: Insufficient documentation

## 2017-02-11 DIAGNOSIS — R079 Chest pain, unspecified: Secondary | ICD-10-CM | POA: Diagnosis not present

## 2017-02-11 DIAGNOSIS — Z7951 Long term (current) use of inhaled steroids: Secondary | ICD-10-CM | POA: Diagnosis not present

## 2017-02-11 DIAGNOSIS — I129 Hypertensive chronic kidney disease with stage 1 through stage 4 chronic kidney disease, or unspecified chronic kidney disease: Secondary | ICD-10-CM | POA: Insufficient documentation

## 2017-02-11 LAB — BASIC METABOLIC PANEL
Anion gap: 8 (ref 5–15)
BUN: 19 mg/dL (ref 6–20)
CO2: 29 mmol/L (ref 22–32)
Calcium: 9.3 mg/dL (ref 8.9–10.3)
Chloride: 103 mmol/L (ref 101–111)
Creatinine, Ser: 0.98 mg/dL (ref 0.44–1.00)
GFR calc Af Amer: >60 mL/min (ref >60)
GFR calc non Af Amer: >60 mL/min (ref >60)
Glucose, Bld: 98 mg/dL (ref 65–99)
Potassium: 3.1 mmol/L — ABNORMAL LOW (ref 3.5–5.1)
Sodium: 140 mmol/L (ref 135–145)

## 2017-02-11 LAB — CBC
HCT: 38.2 % (ref 36.0–46.0)
Hemoglobin: 12.6 g/dL (ref 12.0–15.0)
MCH: 31.5 pg (ref 26.0–34.0)
MCHC: 33 g/dL (ref 30.0–36.0)
MCV: 95.5 fL (ref 78.0–100.0)
Platelets: 253 10*3/uL (ref 150–400)
RBC: 4 MIL/uL (ref 3.87–5.11)
RDW: 14.2 % (ref 11.5–15.5)
WBC: 6.9 10*3/uL (ref 4.0–10.5)

## 2017-02-11 LAB — POCT I-STAT TROPONIN I: Troponin i, poc: 0 ng/mL (ref 0.00–0.08)

## 2017-02-11 LAB — D-DIMER, QUANTITATIVE (NOT AT ARMC): D-Dimer, Quant: 0.44 ug/mL-FEU (ref 0.00–0.50)

## 2017-02-11 MED ORDER — POTASSIUM CHLORIDE CRYS ER 20 MEQ PO TBCR
40.0000 meq | EXTENDED_RELEASE_TABLET | Freq: Once | ORAL | Status: AC
  Administered 2017-02-11: 40 meq via ORAL
  Filled 2017-02-11: qty 2

## 2017-02-11 NOTE — ED Triage Notes (Signed)
Patient c/o aching central chest pain with SOB x3 days. Denies abdominal pain and N/V/D.

## 2017-02-11 NOTE — ED Provider Notes (Signed)
[facility] DEPT Provider Note   CSN: [id number] Arrival date & time: 02/11/17  1946     History   Chief Complaint Chief Complaint  Patient presents with  . Chest Pain    HPI Jill Phillips is a 61 y.o. female.  Patient presents to the ED with a chief complaint of chest pain x 3 days.  She states that the pain is intermittent, but is present more often than not.  She states that it radiates to her left shoulder.  She reports associated dyspnea with exertion.  She has tried using her inhaler with no relief.  She denies any history of ACS, PE, DVT, or leg swelling.  She states that she has been immobile due to back pain is awaiting another back surgery.  She denies any fevers, chills, cough.  Denies any abdominal pain, n/v/d.   The history is provided by the patient. No language interpreter was used.    Past Medical History:  Diagnosis Date  . Active smoker   . Anxiety   . Calcifying tendinitis of shoulder   . Chronic back pain   . Chronic pain syndrome   . COPD (chronic obstructive pulmonary disease) ([place])   . Dysthymic disorder   . Emphysema lung ([place])   . GERD (gastroesophageal reflux disease)   . Headache    "weekly maybe" (01/31/2016)  . Heart murmur    for years, nothing to be concerned about  . Herpes genitalia   . History of blood transfusion 1980   related to "back surgery"  . Hyperlipidemia   . Hypertension   . Hypothyroidism   . Lumbago   . Osteoarthrosis, unspecified whether generalized or localized, lower leg   . Pain in joint, upper arm   . Pneumothorax, left 01/31/2016   S/P Left posterior subcostal pain injection on 01/30/2016  . PONV (postoperative nausea and vomiting)    gets nauseous  with longer surgery. Difficuty voiding after surgery  . Postlaminectomy syndrome, thoracic region   . Primary localized osteoarthrosis, lower leg   . Restless leg syndrome   . Sleep apnea    s/p surgery- last sleep study 2011- doesnt use oxygen or machine at night  as instructed,.   12/2014- Dr Halford Chessman  reports it is negative.  . Thyroid disease     Patient Active Problem List   Diagnosis Date Noted  . CKD (chronic kidney disease) stage 3, GFR 30-59 ml/min 08/07/2016  . GERD (gastroesophageal reflux disease) 08/07/2016  . Prediabetes 08/07/2016  . Chronic respiratory failure ([place]) 07/29/2016  . Hypersomnia 07/29/2016  . Arthritis of carpometacarpal [facility]) joint of left thumb 07/09/2016  . Lump of axilla 05/02/2016  . Pneumothorax on left 01/31/2016  . Chronic, continuous use of opioids 09/06/2015  . Degenerative disc disease, lumbar 08/01/2015    Class: Chronic  . Spondylolisthesis of lumbar region 08/01/2015    Class: Chronic  . Urinary, incontinence, stress female 05/06/2014  . Constipation 05/05/2014  . 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 HTN (hypertension) 05/19/2013  . HLD (hyperlipidemia) 05/19/2013  . Depression 05/19/2013  . Hypothyroidism 05/19/2013  . Tobacco abuse   . Osteoarthritis of both knees 11/18/2011  . RLS (restless legs syndrome) 11/18/2011    Past Surgical History:  Procedure Laterality Date  . APPENDECTOMY    . BACK SURGERY     18 back surgeries (2 thoracic & 16 lumbar) (01/31/2016)  . DILATION AND CURETTAGE OF UTERUS    . HAMMER TOE SURGERY    . JOINT REPLACEMENT    .  KNEE ARTHROSCOPY Right   . LAPAROSCOPIC CHOLECYSTECTOMY    . LUMBAR FUSION N/A 08/01/2015   Procedure: Right sided L1-2 and L2-3 transforaminal lumbar interbody fusion with cages, Extension of posterior fusion T12 to L3, Replaced pedicle screws bilaterally L1-L2 , Replaced left sided pedicle screws L-3. Instrumentation T12 to L3 using local bone graft, Vivigen allograft and cancellous chips;  Surgeon: [name], MD;  Location: [place];  Service: Orthopedics;  Laterality: N/A;  . LUMBAR LAMINECTOMY/DECOMPRESSION MICRODISCECTOMY N/A 01/28/2014   Procedure: Minimally Invasive Right  L1-2 Microdiscectomy;  Surgeon: [name], MD;  Location: [place];  Service:  Orthopedics;  Laterality: N/A;  . TOTAL HIP ARTHROPLASTY Right   . TOTAL KNEE ARTHROPLASTY  05/29/2012   Procedure: TOTAL KNEE ARTHROPLASTY;  Surgeon: Mcarthur Rossetti, MD;  Location[facility];  Service: Orthopedics;  Laterality: Right;  Right Total Knee Arthroplasty  . TUBAL LIGATION    . UVULOPALATOPHARYNGOPLASTY    . VAGINAL HYSTERECTOMY      OB History    No data available       Home Medications    Prior to Admission medications   Medication Sig Start Date End Date Taking? Authorizing Provider  acetaminophen (TYLENOL) 500 MG tablet Take 1,000 mg by mouth every 6 (six) hours as needed for headache.     [provider]  albuterol (PROAIR HFA) 108 (90 Base) MCG/ACT inhaler INHALE 1 PUFF INTO THE LUNGS EVERY 6 HOURS AS NEEDED FOR WHEEZING OR SHORTNESS OF BREATH.NEED OFFICE VISIT FOR FURTHER REFILLS 09/05/16   Nche, Charlene Brooke, NP  amLODipine (NORVASC) 10 MG tablet Take 1 tablet (10 mg total) by mouth daily. F/u appt is due must see MD for refills 02/11/17   [name], MD  benazepril (LOTENSIN) 40 MG tablet TAKE 1 TABLET (40 MG TOTAL) BY MOUTH DAILY. 01/17/17   [name], MD  carisoprodol (SOMA) 350 MG tablet TAKE 1 TABLET BY MOUTH 3 TIMES A DAY. 11/27/16   Bayard Hugger, NP  clonazePAM (KLONOPIN) 2 MG tablet TAKE 1 TABLET BY MOUTH 2 TIMES A DAY AS NEEDED. 01/24/17   Meredith Staggers, MD  diltiazem 2 % GEL Apply 1 application topically 2 (two) times daily. 01/02/17   Nche, Charlene Brooke, NP  fentaNYL (DURAGESIC - DOSED MCG/HR) 50 MCG/HR Place 1 patch (50 mcg total) onto the skin every 3 (three) days. 01/23/17   Bayard Hugger, NP  gabapentin (NEURONTIN) 300 MG capsule TAKE 1 CAPSULE BY MOUTH 2 TIMES DAILY. 09/30/16   Bayard Hugger, NP  hydrochlorothiazide (HYDRODIURIL) 12.5 MG tablet Take 1 tablet (12.5 mg total) by mouth daily. 08/07/16   [name], MD  HYDROcodone-acetaminophen (NORCO) 10-325 MG tablet Take 1 tablet by mouth every 6 (six) hours as needed  (breakthrough pain). 01/23/17   Bayard Hugger, NP  levothyroxine (SYNTHROID, LEVOTHROID) 75 MCG tablet Take 1 tablet (75 mcg total) by mouth daily before breakfast. 11/19/16   [name], MD  naproxen (NAPROSYN) 500 MG tablet TAKE 1 TABLET BY MOUTH TWICE A DAY WITH FOOD. 02/03/17   Mcarthur Rossetti, MD  omeprazole (PRILOSEC) 20 MG capsule TAKE 1 CAPSULE BY MOUTH 2 TIMES DAILY BEFORE A MEAL. 08/20/16   [name], MD  pravastatin (PRAVACHOL) 40 MG tablet TAKE 1 TABLET BY MOUTH DAILY. 02/07/16   Aldine Contes, MD  QUEtiapine (SEROQUEL) 100 MG tablet TAKE 1 TABLET BY MOUTH AT BEDTIME. 02/05/17   Meredith Staggers, MD  rOPINIRole (REQUIP)  0.5 MG tablet TAKE 2 TABLETS BY MOUTH AT BEDTIME. 10/14/16   Bayard Hugger, NP  SPIRIVA HANDIHALER 18 MCG inhalation capsule PLACE 1 CAPSULE INTO INHALER AND INHALE DAILY. 12/02/16   Chesley Mires, MD  tiZANidine (ZANAFLEX) 4 MG tablet TAKE 1 TABLET BY MOUTH EVERY 8 HOURS AS NEEDED FOR MUSCLE PAIN/ SPASMS 01/24/17   Bayard Hugger, NP  valACYclovir (VALTREX) 500 MG tablet TAKE 1 TABLET BY MOUTH DAILY. 06/27/16   Burns, Arloa Koh, MD  venlafaxine XR (EFFEXOR-XR) 150 MG 24 hr capsule Take 2 capsules (300 mg total) by mouth daily. 09/09/16   Burns, Claudina Lick, MD  VENTOLIN HFA 108 (90 Base) MCG/ACT inhaler INHALE 1 PUFF INTO THE LUNGS EVERY 6 HOURS AS NEEDED FOR WHEEZING OR SHORTNESS OF BREATH.**NEED OFFICE VISIT FOR FURTHER REFILLS** 01/29/17   Parrett, Fonnie Mu, NP  VOLTAREN 1 % GEL APPLY 2 GRAMS TO AFFECTED AREA 3 TIMES A DAY AS NEEDED FOR PAIN. 07/19/16   Meredith Staggers, MD    Family History Family History  Problem Relation Age of Onset  . Kidney disease Mother   . Heart disease Father   . Anuerysm Brother 29       brain  . Heart disease Brother   . Heart disease Sister 61       s/p CABG  . Hypertension Sister   . Colon cancer Neg Hx     Social History Social History  Substance Use Topics  . Smoking status: Current Every Day Smoker     Packs/day: 1.25    Years: 45.00    Types: Cigarettes  . Smokeless tobacco: Never Used     Comment: form given 12/25/16  . Alcohol use No     Allergies   Flagyl [metronidazole]; Sulfa antibiotics; Amoxicillin; Chlorzoxazone; Codeine; Darvocet [propoxyphene n-acetaminophen]; Dilaudid [hydromorphone hcl]; Keflex [cephalexin]; Morphine and related; Nitrofurantoin monohyd macro; and Percocet [oxycodone-acetaminophen]   Review of Systems Review of Systems  All other systems reviewed and are negative.    Physical Exam Updated Vital Signs BP 123/83 (BP Location: Right Arm)   Pulse 82   Temp 98.2 F (36.8 C) (Oral)   Resp 18   Ht 5\' 4"  (1.626 m)   Wt 71.7 kg (158 lb)   SpO2 92%   BMI 27.12 kg/m   Physical Exam  Constitutional: She is oriented to person, place, and time. She appears well-developed and well-nourished.  HENT:  Head: Normocephalic and atraumatic.  Eyes: Conjunctivae and EOM are normal. Pupils are equal, round, and reactive to light.  Neck: Normal range of motion. Neck supple.  Cardiovascular: Normal rate and regular rhythm.  Exam reveals no gallop and no friction rub.   No murmur heard. Pulmonary/Chest: Effort normal and breath sounds normal. No respiratory distress. She has no wheezes. She has no rales. She exhibits no tenderness.  Abdominal: Soft. Bowel sounds are normal. She exhibits no distension and no mass. There is no tenderness. There is no rebound and no guarding.  Musculoskeletal: Normal range of motion. She exhibits no edema or tenderness.  Neurological: She is alert and oriented to person, place, and time.  Skin: Skin is warm and dry.  Psychiatric: She has a normal mood and affect. Her behavior is normal. Judgment and thought content normal.  Nursing note and vitals reviewed.    ED Treatments / Results  Labs (all labs ordered are listed, but only abnormal results are displayed) Labs Reviewed  BASIC METABOLIC PANEL - Abnormal; Notable for the  following:  Result Value   Potassium 3.1 (*)    All other components within normal limits  CBC  D-DIMER, QUANTITATIVE (NOT AT [facility])  I-STAT TROPOININ, ED  POCT I-STAT TROPONIN I    EKG  EKG Interpretation  Date/Time:  Tuesday February 11 2017 19:57:11 EDT Ventricular Rate:  89 PR Interval:    QRS Duration: 104 QT Interval:  387 QTC Calculation: 471 R Axis:   30 Text Interpretation:  Sinus rhythm Low voltage, precordial leads since last tracing no significant change Confirmed by Malvin Johns [phone]) on 02/11/2017 8:03:34 PM       Radiology Dg Chest 2 View  Result Date: 02/11/2017 CLINICAL DATA:  61 year old with three-day history of mid and left-sided chest pain radiating into the left arm, associated with shortness of breath. Current smoker. EXAM: CHEST  2 VIEW COMPARISON:  09/26/2016, 02/14/2016 and earlier. FINDINGS: Cardiac silhouette normal in size, unchanged. Thoracic aorta mildly atherosclerotic, unchanged. Hilar and mediastinal contours otherwise unremarkable. Mildly prominent bronchovascular markings diffusely and mild central peribronchial thickening, unchanged. Lungs otherwise clear. No localized airspace consolidation. No pleural effusions. No pneumothorax. Normal pulmonary vascularity. Thoracolumbar levoscoliosis with prior multilevel thoracolumbar fusion. IMPRESSION: No acute cardiopulmonary disease. Stable changes of chronic bronchitis and/or asthma. Electronically Signed   By: Evangeline Dakin M.D.   On: 02/11/2017 20:38    Procedures Procedures (including critical care time)  Medications Ordered in ED Medications  potassium chloride SA (K-DUR,KLOR-CON) CR tablet 40 mEq (not administered)     Initial Impression / Assessment and Plan / ED Course  I have reviewed the triage vital signs and the nursing notes.  Pertinent labs & imaging results that were available during my care of the patient were reviewed by me and considered in my medical decision making (see  chart for details).     Patient with CP, dyspnea on exertion x 3 days.  History of emphysema, but states this feels different.  No improvement with inhaler.  Troponin is negative.  No ischemic changes on EKG.  CXR shows no acute process.  K is 3.1, will give potassium orally.  O2 sat 88-93, this may be normal for patient, she states that she normally runs low, but she is at risk for PE given that she stays in bed all day everyday due to her back pain.  Will check d-dimer.  D dimer is normal.  Delta troponin is negative.  Potassium repleted.  Recommend close follow-up with PCP and cardiology.  Symptoms have been ongoing for 3 days.  No further emergent workup indicated tonight.  Return precautions.  Final Clinical Impressions(s) / ED Diagnoses   Final diagnoses:  Chest pain, unspecified type    New Prescriptions New Prescriptions   No medications on file     Montine Circle, Hershal Coria 02/12/17 Ihor Dow, MD 02/12/17 [phone]

## 2017-02-11 NOTE — ED Notes (Addendum)
Pt is c/o chest pain to mid-sternal chest pressure with associated SOB. Pt states that pain began 3 days ago but worsened today. Pt also reports running out of medication amlodipine, and was unable to make PCP appointment for refill 3 days ago. Pt O2 saturation is 93%. Pt is breathing normal and unlabored.

## 2017-02-12 ENCOUNTER — Other Ambulatory Visit: Payer: Self-pay | Admitting: Registered Nurse

## 2017-02-12 ENCOUNTER — Other Ambulatory Visit: Payer: Self-pay | Admitting: Internal Medicine

## 2017-02-12 LAB — POCT I-STAT TROPONIN I: Troponin i, poc: 0.01 ng/mL (ref 0.00–0.08)

## 2017-02-13 ENCOUNTER — Other Ambulatory Visit: Payer: Self-pay | Admitting: Registered Nurse

## 2017-02-14 ENCOUNTER — Ambulatory Visit
Admission: RE | Admit: 2017-02-14 | Discharge: 2017-02-14 | Disposition: A | Discharge status: Home / Self Care | Payer: Medicare HMO | Source: Ambulatory Visit | Attending: Specialist | Admitting: Specialist

## 2017-02-14 DIAGNOSIS — R102 Pelvic and perineal pain: Secondary | ICD-10-CM

## 2017-02-14 DIAGNOSIS — G8929 Other chronic pain: Secondary | ICD-10-CM

## 2017-02-14 DIAGNOSIS — K6289 Other specified diseases of anus and rectum: Secondary | ICD-10-CM

## 2017-02-14 DIAGNOSIS — M47816 Spondylosis without myelopathy or radiculopathy, lumbar region: Secondary | ICD-10-CM | POA: Diagnosis not present

## 2017-02-14 MED ORDER — GADOBENATE DIMEGLUMINE 529 MG/ML IV SOLN
14.0000 mL | Freq: Once | INTRAVENOUS | Status: AC | PRN
  Administered 2017-02-14: 14 mL via INTRAVENOUS

## 2017-02-15 ENCOUNTER — Other Ambulatory Visit: Payer: Self-pay | Admitting: Internal Medicine

## 2017-02-15 NOTE — Progress Notes (Signed)
Subjective:    Patient ID: Jill Phillips, female    DOB: 1956/02/02, 61 y.o.   MRN: [id number]  HPI     Medications and allergies reviewed with patient and updated if appropriate.  Patient Active Problem List   Diagnosis Date Noted  . Lower back pain 03/03/2017  . CKD (chronic kidney disease) stage 3, GFR 30-59 ml/min 08/07/2016  . GERD (gastroesophageal reflux disease) 08/07/2016  . Prediabetes 08/07/2016  . Chronic respiratory failure ([place]) 07/29/2016  . Hypersomnia 07/29/2016  . Arthritis of carpometacarpal [facility]) joint of left thumb 07/09/2016  . Lump of axilla 05/02/2016  . Pneumothorax on left 01/31/2016  . Chronic, continuous use of opioids 09/06/2015  . Degenerative disc disease, lumbar 08/01/2015    Class: Chronic  . Spondylolisthesis of lumbar region 08/01/2015    Class: Chronic  . Urinary, incontinence, stress female 05/06/2014  . Constipation 05/05/2014  . 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 HTN (hypertension) 05/19/2013  . HLD (hyperlipidemia) 05/19/2013  . Depression 05/19/2013  . Hypothyroidism 05/19/2013  . Tobacco abuse   . Osteoarthritis of both knees 11/18/2011  . RLS (restless legs syndrome) 11/18/2011    Current Outpatient Prescriptions on File Prior to Visit  Medication Sig Dispense Refill  . acetaminophen (TYLENOL) 500 MG tablet Take 1,000 mg by mouth every 6 (six) hours as needed for headache.     . albuterol (PROAIR HFA) 108 (90 Base) MCG/ACT inhaler INHALE 1 PUFF INTO THE LUNGS EVERY 6 HOURS AS NEEDED FOR WHEEZING OR SHORTNESS OF BREATH.NEED OFFICE VISIT FOR FURTHER REFILLS 8.5 g 0  . amLODipine (NORVASC) 10 MG tablet Take 1 tablet (10 mg total) by mouth daily. F/u appt is due must see MD for refills 30 tablet 0  . benazepril (LOTENSIN) 40 MG tablet TAKE 1 TABLET (40 MG TOTAL) BY MOUTH DAILY. 90 tablet 0  . carisoprodol (SOMA) 350 MG tablet TAKE 1 TABLET BY MOUTH 3 TIMES A DAY. 90 tablet 2  . clonazePAM (KLONOPIN) 2 MG tablet TAKE 1 TABLET BY MOUTH 2  TIMES A DAY AS NEEDED. (Patient taking differently: Take 2 mg by mouth 2 (two) times daily as needed for anxiety. TAKE 1 TABLET BY MOUTH 2 TIMES A DAY AS NEEDED.) 60 tablet 2  . hydrochlorothiazide (HYDRODIURIL) 12.5 MG tablet Take 1 tablet (12.5 mg total) by mouth daily. 90 tablet 1  . levothyroxine (SYNTHROID, LEVOTHROID) 75 MCG tablet Take 1 tablet (75 mcg total) by mouth daily before breakfast. 30 tablet 5  . naproxen (NAPROSYN) 500 MG tablet TAKE 1 TABLET BY MOUTH TWICE A DAY WITH FOOD. 60 tablet 5  . omeprazole (PRILOSEC) 20 MG capsule TAKE 1 CAPSULE BY MOUTH 2 TIMES DAILY BEFORE A MEAL. 180 capsule 1  . pravastatin (PRAVACHOL) 40 MG tablet TAKE 1 TABLET BY MOUTH DAILY. (Patient taking differently: TAKE 1 TABLET BY MOUTH IN THE EVENING) 90 tablet PRN  . QUEtiapine (SEROQUEL) 100 MG tablet TAKE 1 TABLET BY MOUTH AT BEDTIME. 30 tablet 2  . rOPINIRole (REQUIP) 0.5 MG tablet TAKE 1 TABLET BY MOUTH AT BEDTIME FOR 1 WEEK THEN INCREASE TO 2 TABLETS AT BEDTIME. (STOP PRIMIDONE) 60 tablet 2  . SPIRIVA HANDIHALER 18 MCG inhalation capsule PLACE 1 CAPSULE INTO INHALER AND INHALE DAILY. 30 capsule 2  . tiZANidine (ZANAFLEX) 4 MG tablet TAKE 1 TABLET BY MOUTH EVERY 8 HOURS AS NEEDED FOR MUSCLE PAIN/ SPASMS 90 tablet 3  . valACYclovir (VALTREX) 500 MG tablet TAKE 1 TABLET BY MOUTH DAILY. 30 tablet 11  .  VOLTAREN 1 % GEL APPLY 2 GRAMS TO AFFECTED AREA 3 TIMES A DAY AS NEEDED FOR PAIN. 300 g 4   No current facility-administered medications on file prior to visit.     Past Medical History:  Diagnosis Date  . Active smoker   . Anxiety   . Calcifying tendinitis of shoulder   . Chronic back pain   . Chronic pain syndrome   . COPD (chronic obstructive pulmonary disease) ([place])   . Dysthymic disorder   . Emphysema lung ([place])   . GERD (gastroesophageal reflux disease)   . Headache    "weekly maybe" (01/31/2016)  . Heart murmur    for years, nothing to be concerned about  . Herpes genitalia   . History  of blood transfusion 1980   related to "back surgery"  . Hyperlipidemia   . Hypertension   . Hypothyroidism   . Lumbago   . Osteoarthrosis, unspecified whether generalized or localized, lower leg   . Pain in joint, upper arm   . Pneumothorax, left 01/31/2016   S/P Left posterior subcostal pain injection on 01/30/2016  . PONV (postoperative nausea and vomiting)    gets nauseous  with longer surgery. Difficuty voiding after surgery  . Postlaminectomy syndrome, thoracic region   . Primary localized osteoarthrosis, lower leg   . Restless leg syndrome   . Sleep apnea    s/p surgery- last sleep study 2011- doesnt use oxygen or machine at night as instructed,.   12/2014- Dr Halford Chessman  reports it is negative.  . Thyroid disease     Past Surgical History:  Procedure Laterality Date  . APPENDECTOMY    . BACK SURGERY     18 back surgeries (2 thoracic & 16 lumbar) (01/31/2016)  . DILATION AND CURETTAGE OF UTERUS    . HAMMER TOE SURGERY    . JOINT REPLACEMENT    . KNEE ARTHROSCOPY Right   . LAPAROSCOPIC CHOLECYSTECTOMY    . LUMBAR FUSION N/A 08/01/2015   Procedure: Right sided L1-2 and L2-3 transforaminal lumbar interbody fusion with cages, Extension of posterior fusion T12 to L3, Replaced pedicle screws bilaterally L1-L2 , Replaced left sided pedicle screws L-3. Instrumentation T12 to L3 using local bone graft, Vivigen allograft and cancellous chips;  Surgeon: [name], MD;  Location: [place];  Service: Orthopedics;  Laterality: N/A;  . LUMBAR LAMINECTOMY/DECOMPRESSION MICRODISCECTOMY N/A 01/28/2014   Procedure: Minimally Invasive Right  L1-2 Microdiscectomy;  Surgeon: [name], MD;  Location: [place];  Service: Orthopedics;  Laterality: N/A;  . TOTAL HIP ARTHROPLASTY Right   . TOTAL KNEE ARTHROPLASTY  05/29/2012   Procedure: TOTAL KNEE ARTHROPLASTY;  Surgeon: Mcarthur Rossetti, MD;  Location[facility];  Service: Orthopedics;  Laterality: Right;  Right Total Knee Arthroplasty  . TUBAL LIGATION     . UVULOPALATOPHARYNGOPLASTY    . VAGINAL HYSTERECTOMY      Social History   Social History  . Marital status: Divorced    Spouse name: n/a  . Number of children: 2  . Years of education: 12+   Occupational History  . disability     back surgeries   Social History Main Topics  . Smoking status: Current Every Day Smoker    Packs/day: 1.25    Years: 45.00    Types: Cigarettes  . Smokeless tobacco: Never Used     Comment: form given 12/25/16  . Alcohol use No  . Drug use: No     Comment: 01/31/2016 "none since ~ 1980"  .  Sexual activity: No   Other Topics Concern  . Not on file   Social History Narrative   Lives alone.  One daughter is local, but is getting ready to move to [place], where her children live with their father.  The other daughter lives near [place], [place].    Family History  Problem Relation Age of Onset  . Kidney disease Mother   . Heart disease Father   . Anuerysm Brother 29       brain  . Heart disease Brother   . Heart disease Sister 17       s/p CABG  . Hypertension Sister   . Colon cancer Neg Hx     Review of Systems     Objective:  There were no vitals filed for this visit. Wt Readings from Last 3 Encounters:  03/03/17 154 lb (69.9 kg)  02/19/17 159 lb (72.1 kg)  02/11/17 158 lb (71.7 kg)   There is no height or weight on file to calculate BMI.   Physical Exam         Assessment & Plan:    See Problem List for Assessment and Plan of chronic medical problems.    This encounter was created in error - please disregard.

## 2017-02-17 ENCOUNTER — Encounter: Payer: Medicare HMO | Admitting: Internal Medicine

## 2017-02-19 ENCOUNTER — Encounter (INDEPENDENT_AMBULATORY_CARE_PROVIDER_SITE_OTHER): Payer: Self-pay | Admitting: Specialist

## 2017-02-19 ENCOUNTER — Encounter: Payer: Medicare HMO | Attending: Physical Medicine & Rehabilitation | Admitting: Physical Medicine & Rehabilitation

## 2017-02-19 ENCOUNTER — Encounter: Payer: Self-pay | Admitting: Physical Medicine & Rehabilitation

## 2017-02-19 ENCOUNTER — Ambulatory Visit (INDEPENDENT_AMBULATORY_CARE_PROVIDER_SITE_OTHER): Payer: Medicare HMO | Admitting: Specialist

## 2017-02-19 VITALS — BP 126/81 | HR 100 | Resp 14

## 2017-02-19 VITALS — BP 139/85 | HR 98 | Ht 64.0 in | Wt 159.0 lb

## 2017-02-19 DIAGNOSIS — E039 Hypothyroidism, unspecified: Secondary | ICD-10-CM | POA: Diagnosis not present

## 2017-02-19 DIAGNOSIS — Z9889 Other specified postprocedural states: Secondary | ICD-10-CM | POA: Diagnosis not present

## 2017-02-19 DIAGNOSIS — E079 Disorder of thyroid, unspecified: Secondary | ICD-10-CM | POA: Insufficient documentation

## 2017-02-19 DIAGNOSIS — G8929 Other chronic pain: Secondary | ICD-10-CM

## 2017-02-19 DIAGNOSIS — E785 Hyperlipidemia, unspecified: Secondary | ICD-10-CM | POA: Insufficient documentation

## 2017-02-19 DIAGNOSIS — M7918 Myalgia, other site: Secondary | ICD-10-CM

## 2017-02-19 DIAGNOSIS — G894 Chronic pain syndrome: Secondary | ICD-10-CM | POA: Diagnosis not present

## 2017-02-19 DIAGNOSIS — Z5181 Encounter for therapeutic drug level monitoring: Secondary | ICD-10-CM

## 2017-02-19 DIAGNOSIS — G9619 Other disorders of meninges, not elsewhere classified: Secondary | ICD-10-CM | POA: Insufficient documentation

## 2017-02-19 DIAGNOSIS — M8448XA Pathological fracture, other site, initial encounter for fracture: Secondary | ICD-10-CM | POA: Diagnosis not present

## 2017-02-19 DIAGNOSIS — M174 Other bilateral secondary osteoarthritis of knee: Secondary | ICD-10-CM

## 2017-02-19 DIAGNOSIS — M5126 Other intervertebral disc displacement, lumbar region: Secondary | ICD-10-CM | POA: Insufficient documentation

## 2017-02-19 DIAGNOSIS — M2170 Unequal limb length (acquired), unspecified site: Secondary | ICD-10-CM | POA: Diagnosis not present

## 2017-02-19 DIAGNOSIS — M51369 Other intervertebral disc degeneration, lumbar region without mention of lumbar back pain or lower extremity pain: Secondary | ICD-10-CM

## 2017-02-19 DIAGNOSIS — J449 Chronic obstructive pulmonary disease, unspecified: Secondary | ICD-10-CM | POA: Diagnosis not present

## 2017-02-19 DIAGNOSIS — I10 Essential (primary) hypertension: Secondary | ICD-10-CM | POA: Insufficient documentation

## 2017-02-19 DIAGNOSIS — K219 Gastro-esophageal reflux disease without esophagitis: Secondary | ICD-10-CM | POA: Insufficient documentation

## 2017-02-19 DIAGNOSIS — Z79899 Other long term (current) drug therapy: Secondary | ICD-10-CM | POA: Diagnosis not present

## 2017-02-19 DIAGNOSIS — M791 Myalgia: Secondary | ICD-10-CM

## 2017-02-19 DIAGNOSIS — M5136 Other intervertebral disc degeneration, lumbar region: Secondary | ICD-10-CM | POA: Diagnosis not present

## 2017-02-19 DIAGNOSIS — M4316 Spondylolisthesis, lumbar region: Secondary | ICD-10-CM

## 2017-02-19 DIAGNOSIS — M533 Sacrococcygeal disorders, not elsewhere classified: Secondary | ICD-10-CM

## 2017-02-19 DIAGNOSIS — M960 Pseudarthrosis after fusion or arthrodesis: Secondary | ICD-10-CM | POA: Diagnosis not present

## 2017-02-19 DIAGNOSIS — M961 Postlaminectomy syndrome, not elsewhere classified: Secondary | ICD-10-CM | POA: Insufficient documentation

## 2017-02-19 DIAGNOSIS — M546 Pain in thoracic spine: Secondary | ICD-10-CM | POA: Diagnosis not present

## 2017-02-19 DIAGNOSIS — G2581 Restless legs syndrome: Secondary | ICD-10-CM | POA: Diagnosis not present

## 2017-02-19 DIAGNOSIS — M170 Bilateral primary osteoarthritis of knee: Secondary | ICD-10-CM | POA: Insufficient documentation

## 2017-02-19 DIAGNOSIS — Z720 Tobacco use: Secondary | ICD-10-CM | POA: Diagnosis not present

## 2017-02-19 DIAGNOSIS — Z9981 Dependence on supplemental oxygen: Secondary | ICD-10-CM | POA: Insufficient documentation

## 2017-02-19 MED ORDER — CALCITONIN (SALMON) 200 UNIT/ACT NA SOLN: 1.0000 | Freq: Every day | NASAL | Status: DC

## 2017-02-19 MED ORDER — FENTANYL 50 MCG/HR TD PT72: 50.0000 ug | MEDICATED_PATCH | TRANSDERMAL | 0 refills | Status: DC

## 2017-02-19 MED ORDER — LIDOCAINE 5 % EX OINT: 1.0000 "application " | TOPICAL_OINTMENT | Freq: Three times a day (TID) | CUTANEOUS | 0 refills | Status: DC | PRN

## 2017-02-19 MED ORDER — OXYCODONE-ACETAMINOPHEN 10-325 MG PO TABS: 1.0000 | ORAL_TABLET | Freq: Four times a day (QID) | ORAL | 0 refills | Status: DC | PRN

## 2017-02-19 MED ORDER — GABAPENTIN 300 MG PO CAPS: 300.0000 mg | ORAL_CAPSULE | Freq: Four times a day (QID) | ORAL | 5 refills | Status: DC

## 2017-02-19 NOTE — Patient Instructions (Signed)
CONSIDER VISITING YOUR GYN ABOUT YOUR RECTAL PAIN   PLEASE FEEL FREE TO CALL OUR OFFICE WITH ANY PROBLEMS OR QUESTIONS ([phone])

## 2017-02-19 NOTE — Progress Notes (Signed)
Subjective:    Patient ID: Jill Phillips, female    DOB: 1955-12-17, 60 y.o.   MRN: [id number]  HPI   Jill Phillips is here in follow up of her chronic pain. She has had worsening rectal pain which has been undiagnosed as of yet. She states that it's been present since February. Dr. [name] saw her this morning and found the following.    IMPRESSION: 1. Subtle insufficiency fracture of the right sacral ala with adjacent edema. 2. Reactive marrow edema about the right SI joint likely degenerative in etiology. 3. No findings for the patient's rectal pain. No annular constricting mass, inflammation or adenopathy is identified within the pelvis. 4. Lumbar spondylosis with partially imaged L3-4 spinal fusion hardware. 5. Right arthroplasty limits assessment of the right hemipelvis.  He recommended rest/avoidance of lifting and calcitonin/vitamin D/heat/ice  Jill Phillips has seen GI and had a colonoscopy which apparently was normal. She reports no pain during defecation or with direct palpation of the rectum although ice helps the pain to an extent. The rectal pain can last for hours on end. It can trigger her RLS symptoms.    Pain Inventory Average Pain 7 Pain Right Now 7 My pain is burning, dull and aching  In the last 24 hours, has pain interfered with the following? General activity 3 Relation with others 5 Enjoyment of life 8 What TIME of day is your pain at its worst? daytime, evening Sleep (in general) Fair  Pain is worse with: walking, bending, sitting and standing Pain improves with: rest and medication Relief from Meds: 8  Mobility walk without assistance Do you have any goals in this area?  no  Function Do you have any goals in this area?  no  Neuro/Psych trouble walking spasms anxiety  Prior Studies Any changes since last visit?  no  Physicians involved in your care Any changes since last visit?  no   Family History  Problem Relation Age of Onset  . Kidney  disease Mother   . Heart disease Father   . Anuerysm Brother 29       brain  . Heart disease Brother   . Heart disease Sister 45       s/p CABG  . Hypertension Sister   . Colon cancer Neg Hx    Social History   Social History  . Marital status: Divorced    Spouse name: n/a  . Number of children: 2  . Years of education: 12+   Occupational History  . disability     back surgeries   Social History Main Topics  . Smoking status: Current Every Day Smoker    Packs/day: 1.25    Years: 45.00    Types: Cigarettes  . Smokeless tobacco: Never Used     Comment: form given 12/25/16  . Alcohol use No  . Drug use: No     Comment: 01/31/2016 "none since ~ 1980"  . Sexual activity: No   Other Topics Concern  . None   Social History Narrative   Lives alone.  One daughter is local, but is getting ready to move to [place], where her children live with their father.  The other daughter lives near [place], [place].   Past Surgical History:  Procedure Laterality Date  . APPENDECTOMY    . BACK SURGERY     18 back surgeries (2 thoracic & 16 lumbar) (01/31/2016)  . DILATION AND CURETTAGE OF UTERUS    . HAMMER TOE SURGERY    .  JOINT REPLACEMENT    . KNEE ARTHROSCOPY Right   . LAPAROSCOPIC CHOLECYSTECTOMY    . LUMBAR FUSION N/A 08/01/2015   Procedure: Right sided L1-2 and L2-3 transforaminal lumbar interbody fusion with cages, Extension of posterior fusion T12 to L3, Replaced pedicle screws bilaterally L1-L2 , Replaced left sided pedicle screws L-3. Instrumentation T12 to L3 using local bone graft, Vivigen allograft and cancellous chips;  Surgeon: [name], MD;  Location: [place];  Service: Orthopedics;  Laterality: N/A;  . LUMBAR LAMINECTOMY/DECOMPRESSION MICRODISCECTOMY N/A 01/28/2014   Procedure: Minimally Invasive Right  L1-2 Microdiscectomy;  Surgeon: [name], MD;  Location: [place];  Service: Orthopedics;  Laterality: N/A;  . TOTAL HIP ARTHROPLASTY Right   . TOTAL KNEE ARTHROPLASTY   05/29/2012   Procedure: TOTAL KNEE ARTHROPLASTY;  Surgeon: Mcarthur Rossetti, MD;  [facility];  Service: Orthopedics;  Laterality: Right;  Right Total Knee Arthroplasty  . TUBAL LIGATION    . UVULOPALATOPHARYNGOPLASTY    . VAGINAL HYSTERECTOMY     Past Medical History:  Diagnosis Date  . Active smoker   . Anxiety   . Calcifying tendinitis of shoulder   . Chronic back pain   . Chronic pain syndrome   . COPD (chronic obstructive pulmonary disease) ([place])   . Dysthymic disorder   . Emphysema lung ([place])   . GERD (gastroesophageal reflux disease)   . Headache    "weekly maybe" (01/31/2016)  . Heart murmur    for years, nothing to be concerned about  . Herpes genitalia   . History of blood transfusion 1980   related to "back surgery"  . Hyperlipidemia   . Hypertension   . Hypothyroidism   . Lumbago   . Osteoarthrosis, unspecified whether generalized or localized, lower leg   . Pain in joint, upper arm   . Pneumothorax, left 01/31/2016   S/P Left posterior subcostal pain injection on 01/30/2016  . PONV (postoperative nausea and vomiting)    gets nauseous  with longer surgery. Difficuty voiding after surgery  . Postlaminectomy syndrome, thoracic region   . Primary localized osteoarthrosis, lower leg   . Restless leg syndrome   . Sleep apnea    s/p surgery- last sleep study 2011- doesnt use oxygen or machine at night as instructed,.   12/2014- Dr Halford Chessman  reports it is negative.  . Thyroid disease    BP 126/81 (BP Location: Left Arm, Patient Position: Sitting, Cuff Size: Normal)   Pulse 100   Resp 14   SpO2 95%   Opioid Risk Score:   Fall Risk Score:  `1  Depression screen PHQ 2/9  Depression screen [facility] 2/9 05/15/2016 05/02/2016 12/07/2015 11/15/2015 10/13/2015 09/06/2015 05/22/2015  Decreased Interest 0 0 0 0 0 0 0  Down, Depressed, Hopeless 0 0 0 0 - 0 0  PHQ - 2 Score 0 0 0 0 0 0 0  Altered sleeping - - - - - - -  Tired, decreased energy - - - - - - -  Change in appetite  - - - - - - -  Feeling bad or failure about yourself  - - - - - - -  Trouble concentrating - - - - - - -  Moving slowly or fidgety/restless - - - - - - -  Suicidal thoughts - - - - - - -  PHQ-9 Score - - - - - - -  Some recent data might be hidden    Review of Systems  Constitutional:  Negative.   HENT: Negative.   Eyes: Negative.   Respiratory: Positive for shortness of breath.   Cardiovascular: Negative.   Gastrointestinal: Negative.   Endocrine: Negative.   Genitourinary: Negative.   Musculoskeletal: Positive for arthralgias, back pain, gait problem and myalgias.       Spasms  Skin: Negative.   Allergic/Immunologic: Negative.   Hematological: Negative.   Psychiatric/Behavioral: The patient is nervous/anxious.   All other systems reviewed and are negative.      Objective:   Physical Exam  Constitutional: She is oriented to person, place, and time. She appears well-developed and well-nourished.  HENT:  Head: Normocephalic.  Eyes: EOM are normal. Pupils are equal, round, and reactive to light.  Neck: Normal range of motion.  Cardiovascular: Normal rate.  Pulmonary/Chest: Effort normal.  Abdominal: Soft.  Musculoskeletal:  Right knee with minimal tenderness. Post-op scars noted. Knee rom 120+ with flexion. Weight bearing better with minimal antalgia seen.  Right shoulder with only mild tenderness with ROM. No shoulder instability noted. Had full AROM. Low back/pelvic with persistent hemi-elevation of the left pelvis and tightness of the lumbar paraspinals on the left with lean to right.. Her last rib comes close to contacting the left iliac crest.  Neurological: She is alert and oriented to person, place, and time. Strength near 5/5 except where pain inhibition. Sensation grossly intact Skin: Skin is warm.  Psychiatric: anxious. Quickly become tearful.  Assessment & Plan:  ASSESSMENT:  1. 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 Sacral  insufficiency fracture by MRI as well.  2. Osteoarthritis of the knees bilaterally, right greater than left.  3. Rotator cuff syndrome/subacromial bursitis.  4. Restless legs syndrome.  5. O2 dependent at night  6. Tobacco abuse.  7. Right leg length discrepancy. 8. Left CMC arthritis 9. 4 month history of rectal pain. Normal GI work up. Sacral fractures would not likely account for this.    PLAN:  1. Refilled fentanyl to 50 mcg every 72 hours. We will continue the opioid monitoring program, this consists of regular clinic visits, examinations, urine drug screen, pill counts as well as use of [place] Controlled Substance Reporting System. [place].   -additionally will change hydrocodone to percocet 10/325 to see if we can better control pain levels. One q6 prn #120 2. Continue with 4mg  klonopin at night. Continue with gabapentin also.   3. Will try lidocaine gel for rectal pain. She also might want to see a GYN. i'm not sure what else to offer however.    4. Asked her to follow up with pulmonology about the low O2 sats. May need adjustments to her regimen. She continues to heavily smoke as well. I advised her to use her spiriva daily as written as well. 5. Pelvic mgt per Dr. [name]. I'm in agreement.   6. Follow up with NP in 1 month. 30 minutes of face to face patient care time were spent during this visit. All questions were encouraged and answered.   Additionally daugther was in the room who was quite irritable and attempted on multiple occasions to create a scene by making uncalled for comments to this provider. Consider keeping her out of room for future visits.

## 2017-02-19 NOTE — Progress Notes (Signed)
Office Visit Note   Patient: Jill Phillips           Date of Birth: [birth year]           MRN: [id number] Visit Date: 02/19/2017              Requested by: [name], MD [place], [place] [address] PCP: [name], MD   Assessment & Plan: Visit Diagnoses:  1. Sacral insufficiency fracture, initial encounter   2. Sacroiliac joint disease   3. Chronic left-sided thoracic back pain   4. Pseudarthrosis after fusion or arthrodesis     Plan:Avoid frequent bending and stooping  No lifting greater than 10 lbs. May use ice or moist heat for pain. Weight loss is of benefit. Handicap license is approved.  Follow-Up Instructions: Return in about 4 weeks (around 03/19/2017).   Orders:  Orders Placed This Encounter  Procedures  . CT LUMBAR SPINE WO CONTRAST  . Vitamin D 1,25 dihydroxy   Meds ordered this encounter  Medications  . calcitonin ([place]) (MIACALCIN/FORTICAL) nasal spray 1 spray      Procedures: No procedures performed   Clinical Data: No additional findings.   Subjective: Chief Complaint  Patient presents with  . Pelvis - Pain, Follow-up    MRI Review-Pelvis    61 year old female with persisting pain in her pelvis and rectum. She underwent MRI of the pelvis 02/14/2017 the results are availabe for review. She has a long fusion T11 to S1 with previous CT scans showing non union of the L2-3 and partial union L1-2 interbody fusion sites. The last CT scan wa s in 06/2016.     Review of Systems   Objective: Vital Signs: BP 139/85 (BP Location: Left Arm, Patient Position: Sitting)   Pulse 98   Ht 5\' 4"  (1.626 m)   Wt 159 lb (72.1 kg)   BMI 27.29 kg/m   Physical Exam  Ortho Exam  Specialty Comments:  No specialty comments available.  Imaging: No results found.   PMFS History: Patient Active Problem List   Diagnosis Date Noted  . Degenerative disc disease, lumbar 08/01/2015    Priority: High    Class: Chronic  .  Spondylolisthesis of lumbar region 08/01/2015    Priority: High    Class: Chronic  . CKD (chronic kidney disease) stage 3, GFR 30-59 ml/min 08/07/2016  . GERD (gastroesophageal reflux disease) 08/07/2016  . Prediabetes 08/07/2016  . Chronic respiratory failure ([place]) 07/29/2016  . Hypersomnia 07/29/2016  . Arthritis of carpometacarpal [facility]) joint of left thumb 07/09/2016  . Lump of axilla 05/02/2016  . Pneumothorax on left 01/31/2016  . Chronic, continuous use of opioids 09/06/2015  . Urinary, incontinence, stress female 05/06/2014  . Constipation 05/05/2014  . 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 HTN (hypertension) 05/19/2013  . HLD (hyperlipidemia) 05/19/2013  . Depression 05/19/2013  . Hypothyroidism 05/19/2013  . Tobacco abuse   . Osteoarthritis of both knees 11/18/2011  . RLS (restless legs syndrome) 11/18/2011   Past Medical History:  Diagnosis Date  . Active smoker   . Anxiety   . Calcifying tendinitis of shoulder   . Chronic back pain   . Chronic pain syndrome   . COPD (chronic obstructive pulmonary disease) ([place])   . Dysthymic disorder   . Emphysema lung ([place])   . GERD (gastroesophageal reflux disease)   . Headache    "weekly maybe" (01/31/2016)  . Heart murmur    for years, nothing to be  concerned about  . Herpes genitalia   . History of blood transfusion 1980   related to "back surgery"  . Hyperlipidemia   . Hypertension   . Hypothyroidism   . Lumbago   . Osteoarthrosis, unspecified whether generalized or localized, lower leg   . Pain in joint, upper arm   . Pneumothorax, left 01/31/2016   S/P Left posterior subcostal pain injection on 01/30/2016  . PONV (postoperative nausea and vomiting)    gets nauseous  with longer surgery. Difficuty voiding after surgery  . Postlaminectomy syndrome, thoracic region   . Primary localized osteoarthrosis, lower leg   . Restless leg syndrome   . Sleep apnea    s/p surgery- last sleep study 2011- doesnt use oxygen or machine at  night as instructed,.   12/2014- Dr Halford Chessman  reports it is negative.  . Thyroid disease     Family History  Problem Relation Age of Onset  . Kidney disease Mother   . Heart disease Father   . Anuerysm Brother 29       brain  . Heart disease Brother   . Heart disease Sister 58       s/p CABG  . Hypertension Sister   . Colon cancer Neg Hx     Past Surgical History:  Procedure Laterality Date  . APPENDECTOMY    . BACK SURGERY     18 back surgeries (2 thoracic & 16 lumbar) (01/31/2016)  . DILATION AND CURETTAGE OF UTERUS    . HAMMER TOE SURGERY    . JOINT REPLACEMENT    . KNEE ARTHROSCOPY Right   . LAPAROSCOPIC CHOLECYSTECTOMY    . LUMBAR FUSION N/A 08/01/2015   Procedure: Right sided L1-2 and L2-3 transforaminal lumbar interbody fusion with cages, Extension of posterior fusion T12 to L3, Replaced pedicle screws bilaterally L1-L2 , Replaced left sided pedicle screws L-3. Instrumentation T12 to L3 using local bone graft, Vivigen allograft and cancellous chips;  Surgeon: [name], MD;  Location: [place];  Service: Orthopedics;  Laterality: N/A;  . LUMBAR LAMINECTOMY/DECOMPRESSION MICRODISCECTOMY N/A 01/28/2014   Procedure: Minimally Invasive Right  L1-2 Microdiscectomy;  Surgeon: [name], MD;  Location: [place];  Service: Orthopedics;  Laterality: N/A;  . TOTAL HIP ARTHROPLASTY Right   . TOTAL KNEE ARTHROPLASTY  05/29/2012   Procedure: TOTAL KNEE ARTHROPLASTY;  Surgeon: Mcarthur Rossetti, MD;  [facility];  Service: Orthopedics;  Laterality: Right;  Right Total Knee Arthroplasty  . TUBAL LIGATION    . UVULOPALATOPHARYNGOPLASTY    . VAGINAL HYSTERECTOMY     Social History   Occupational History  . disability     back surgeries   Social History Main Topics  . Smoking status: Current Every Day Smoker    Packs/day: 1.25    Years: 45.00    Types: Cigarettes  . Smokeless tobacco: Never Used     Comment: form given 12/25/16  . Alcohol use No  . Drug use: No     Comment:  01/31/2016 "none since ~ 1980"  . Sexual activity: No

## 2017-02-19 NOTE — Patient Instructions (Signed)
Avoid frequent bending and stooping  No lifting greater than 10 lbs. May use ice or moist heat for pain. Weight loss is of benefit. Handicap license is approved.   

## 2017-02-22 ENCOUNTER — Other Ambulatory Visit: Payer: Self-pay | Admitting: Registered Nurse

## 2017-02-22 DIAGNOSIS — M7918 Myalgia, other site: Secondary | ICD-10-CM

## 2017-02-22 DIAGNOSIS — M5136 Other intervertebral disc degeneration, lumbar region: Secondary | ICD-10-CM

## 2017-02-22 DIAGNOSIS — M4316 Spondylolisthesis, lumbar region: Secondary | ICD-10-CM

## 2017-02-23 LAB — VITAMIN D 1,25 DIHYDROXY
Vitamin D 1, 25 (OH)2 Total: 22 pg/mL (ref 18–72)
Vitamin D2 1, 25 (OH)2: <8 pg/mL
Vitamin D3 1, 25 (OH)2: 22 pg/mL

## 2017-02-25 ENCOUNTER — Ambulatory Visit
Admission: RE | Admit: 2017-02-25 | Discharge: 2017-02-25 | Disposition: A | Discharge status: Home / Self Care | Payer: Medicare HMO | Source: Ambulatory Visit | Attending: Specialist | Admitting: Specialist

## 2017-02-25 ENCOUNTER — Ambulatory Visit: Payer: Medicare HMO | Admitting: Internal Medicine

## 2017-02-25 DIAGNOSIS — M533 Sacrococcygeal disorders, not elsewhere classified: Secondary | ICD-10-CM

## 2017-02-25 DIAGNOSIS — M8448XA Pathological fracture, other site, initial encounter for fracture: Secondary | ICD-10-CM

## 2017-02-25 DIAGNOSIS — M546 Pain in thoracic spine: Secondary | ICD-10-CM

## 2017-02-25 DIAGNOSIS — M5126 Other intervertebral disc displacement, lumbar region: Secondary | ICD-10-CM | POA: Diagnosis not present

## 2017-02-25 DIAGNOSIS — G8929 Other chronic pain: Secondary | ICD-10-CM

## 2017-02-25 DIAGNOSIS — M960 Pseudarthrosis after fusion or arthrodesis: Secondary | ICD-10-CM

## 2017-02-27 ENCOUNTER — Telehealth (INDEPENDENT_AMBULATORY_CARE_PROVIDER_SITE_OTHER): Payer: Self-pay | Admitting: Specialist

## 2017-02-27 NOTE — Telephone Encounter (Signed)
Patient called needing the results of her blood work done on 02/19/17.

## 2017-02-27 NOTE — Telephone Encounter (Signed)
Patient called needing the results of her blood work done on 02/19/17. The number to contact patient is [phone]

## 2017-03-01 NOTE — Progress Notes (Signed)
Subjective:    Patient ID: Jill Phillips, female    DOB: [birth year], 61 y.o.   MRN: [id number]  HPI The patient is here for follow up.  Hypertension: She is taking her medication daily. She is compliant with a low sodium diet.  She denies chest pain, palpitations, edema, shortness of breath and regular headaches. She is not exercising regularly.  She spends most of her time in bed.      GERD:  She is taking her medication daily as prescribed.  She has GERD symptoms at night when she is sleeping about 5 times a night.     Hypothyroidism:  She is taking her medication daily.  She denies any recent changes in energy or weight that are unexplained.   CKD: she drinks mostly coffee during the day.  She does take one naprosyn daily = 500 mg daily.    Hyperlipidemia: She is taking her medication daily. She is compliant with a low fat/cholesterol diet. She is not exercising regularly.    Prediabetes:  She is compliant with a low sugar/carbohydrate diet.  She is exercising regularly.  Saturday morning she woke up with swelling and pain in her right upper buttock region.  She did find out from orthopedics that she will have surgery.  She has a pinched nerve and one of her prior fusions did not take.  She denies falls.  There is no lump or redness.  The pain is localized and does not radiating.   Rectal pain:  She still has the rectal pain.  The pain medication helps a little.  She uses ice and it helps.     Medications and allergies reviewed with patient and updated if appropriate.  Patient Active Problem List   Diagnosis Date Noted  . Lower back pain 03/03/2017  . CKD (chronic kidney disease) stage 3, GFR 30-59 ml/min 08/07/2016  . GERD (gastroesophageal reflux disease) 08/07/2016  . Prediabetes 08/07/2016  . Chronic respiratory failure ([place]) 07/29/2016  . Hypersomnia 07/29/2016  . Arthritis of carpometacarpal [facility]) joint of left thumb 07/09/2016  . Lump of axilla 05/02/2016  .  Pneumothorax on left 01/31/2016  . Chronic, continuous use of opioids 09/06/2015  . Degenerative disc disease, lumbar 08/01/2015    Class: Chronic  . Spondylolisthesis of lumbar region 08/01/2015    Class: Chronic  . Urinary, incontinence, stress female 05/06/2014  . Constipation 05/05/2014  . 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 HTN (hypertension) 05/19/2013  . HLD (hyperlipidemia) 05/19/2013  . Depression 05/19/2013  . Hypothyroidism 05/19/2013  . Tobacco abuse   . Osteoarthritis of both knees 11/18/2011  . RLS (restless legs syndrome) 11/18/2011    Current Outpatient Prescriptions on File Prior to Visit  Medication Sig Dispense Refill  . acetaminophen (TYLENOL) 500 MG tablet Take 1,000 mg by mouth every 6 (six) hours as needed for headache.     . albuterol (PROAIR HFA) 108 (90 Base) MCG/ACT inhaler INHALE 1 PUFF INTO THE LUNGS EVERY 6 HOURS AS NEEDED FOR WHEEZING OR SHORTNESS OF BREATH.NEED OFFICE VISIT FOR FURTHER REFILLS 8.5 g 0  . amLODipine (NORVASC) 10 MG tablet Take 1 tablet (10 mg total) by mouth daily. F/u appt is due must see MD for refills 30 tablet 0  . benazepril (LOTENSIN) 40 MG tablet TAKE 1 TABLET (40 MG TOTAL) BY MOUTH DAILY. 90 tablet 0  . carisoprodol (SOMA) 350 MG tablet TAKE 1 TABLET BY MOUTH 3 TIMES A DAY. 90 tablet 2  . clonazePAM (KLONOPIN) 2 MG  tablet TAKE 1 TABLET BY MOUTH 2 TIMES A DAY AS NEEDED. (Patient taking differently: Take 2 mg by mouth 2 (two) times daily as needed for anxiety. TAKE 1 TABLET BY MOUTH 2 TIMES A DAY AS NEEDED.) 60 tablet 2  . fentaNYL (DURAGESIC - DOSED MCG/HR) 50 MCG/HR Place 1 patch (50 mcg total) onto the skin every 3 (three) days. 10 patch 0  . gabapentin (NEURONTIN) 300 MG capsule Take 1 capsule (300 mg total) by mouth 4 (four) times daily. 120 capsule 5  . hydrochlorothiazide (HYDRODIURIL) 12.5 MG tablet Take 1 tablet (12.5 mg total) by mouth daily. 90 tablet 1  . levothyroxine (SYNTHROID, LEVOTHROID) 75 MCG tablet Take 1 tablet (75 mcg  total) by mouth daily before breakfast. 30 tablet 5  . naproxen (NAPROSYN) 500 MG tablet TAKE 1 TABLET BY MOUTH TWICE A DAY WITH FOOD. 60 tablet 5  . omeprazole (PRILOSEC) 20 MG capsule TAKE 1 CAPSULE BY MOUTH 2 TIMES DAILY BEFORE A MEAL. 180 capsule 1  . oxyCODONE-acetaminophen (PERCOCET) 10-325 MG tablet Take 1 tablet by mouth every 6 (six) hours as needed for pain. 120 tablet 0  . pravastatin (PRAVACHOL) 40 MG tablet TAKE 1 TABLET BY MOUTH DAILY. (Patient taking differently: TAKE 1 TABLET BY MOUTH IN THE EVENING) 90 tablet PRN  . QUEtiapine (SEROQUEL) 100 MG tablet TAKE 1 TABLET BY MOUTH AT BEDTIME. 30 tablet 2  . rOPINIRole (REQUIP) 0.5 MG tablet TAKE 1 TABLET BY MOUTH AT BEDTIME FOR 1 WEEK THEN INCREASE TO 2 TABLETS AT BEDTIME. (STOP PRIMIDONE) 60 tablet 2  . SPIRIVA HANDIHALER 18 MCG inhalation capsule PLACE 1 CAPSULE INTO INHALER AND INHALE DAILY. 30 capsule 2  . tiZANidine (ZANAFLEX) 4 MG tablet TAKE 1 TABLET BY MOUTH EVERY 8 HOURS AS NEEDED FOR MUSCLE PAIN/ SPASMS 90 tablet 3  . valACYclovir (VALTREX) 500 MG tablet TAKE 1 TABLET BY MOUTH DAILY. 30 tablet 11  . venlafaxine XR (EFFEXOR-XR) 150 MG 24 hr capsule Take 2 capsules (300 mg total) by mouth daily. 60 capsule 5  . VOLTAREN 1 % GEL APPLY 2 GRAMS TO AFFECTED AREA 3 TIMES A DAY AS NEEDED FOR PAIN. 300 g 4   Current Facility-Administered Medications on File Prior to Visit  Medication Dose Route Frequency Provider Last Rate Last Dose  . calcitonin (salmon) (MIACALCIN/FORTICAL) nasal spray 1 spray  1 spray Alternating Nares Daily [name], MD        Past Medical History:  Diagnosis Date  . Active smoker   . Anxiety   . Calcifying tendinitis of shoulder   . Chronic back pain   . Chronic pain syndrome   . COPD (chronic obstructive pulmonary disease) ([place])   . Dysthymic disorder   . Emphysema lung ([place])   . GERD (gastroesophageal reflux disease)   . Headache    "weekly maybe" (01/31/2016)  . Heart murmur    for years,  nothing to be concerned about  . Herpes genitalia   . History of blood transfusion 1980   related to "back surgery"  . Hyperlipidemia   . Hypertension   . Hypothyroidism   . Lumbago   . Osteoarthrosis, unspecified whether generalized or localized, lower leg   . Pain in joint, upper arm   . Pneumothorax, left 01/31/2016   S/P Left posterior subcostal pain injection on 01/30/2016  . PONV (postoperative nausea and vomiting)    gets nauseous  with longer surgery. Difficuty voiding after surgery  . Postlaminectomy syndrome, thoracic region   .  Primary localized osteoarthrosis, lower leg   . Restless leg syndrome   . Sleep apnea    s/p surgery- last sleep study 2011- doesnt use oxygen or machine at night as instructed,.   12/2014- Dr Halford Chessman  reports it is negative.  . Thyroid disease     Past Surgical History:  Procedure Laterality Date  . APPENDECTOMY    . BACK SURGERY     18 back surgeries (2 thoracic & 16 lumbar) (01/31/2016)  . DILATION AND CURETTAGE OF UTERUS    . HAMMER TOE SURGERY    . JOINT REPLACEMENT    . KNEE ARTHROSCOPY Right   . LAPAROSCOPIC CHOLECYSTECTOMY    . LUMBAR FUSION N/A 08/01/2015   Procedure: Right sided L1-2 and L2-3 transforaminal lumbar interbody fusion with cages, Extension of posterior fusion T12 to L3, Replaced pedicle screws bilaterally L1-L2 , Replaced left sided pedicle screws L-3. Instrumentation T12 to L3 using local bone graft, Vivigen allograft and cancellous chips;  Surgeon: [name], MD;  Location: [place];  Service: Orthopedics;  Laterality: N/A;  . LUMBAR LAMINECTOMY/DECOMPRESSION MICRODISCECTOMY N/A 01/28/2014   Procedure: Minimally Invasive Right  L1-2 Microdiscectomy;  Surgeon: [name], MD;  Location: [place];  Service: Orthopedics;  Laterality: N/A;  . TOTAL HIP ARTHROPLASTY Right   . TOTAL KNEE ARTHROPLASTY  05/29/2012   Procedure: TOTAL KNEE ARTHROPLASTY;  Surgeon: Mcarthur Rossetti, MD;  Location: [facility];  Service: Orthopedics;   Laterality: Right;  Right Total Knee Arthroplasty  . TUBAL LIGATION    . UVULOPALATOPHARYNGOPLASTY    . VAGINAL HYSTERECTOMY      Social History   Social History  . Marital status: Divorced    Spouse name: n/a  . Number of children: 2  . Years of education: 12+   Occupational History  . disability     back surgeries   Social History Main Topics  . Smoking status: Current Every Day Smoker    Packs/day: 1.25    Years: 45.00    Types: Cigarettes  . Smokeless tobacco: Never Used     Comment: form given 12/25/16  . Alcohol use No  . Drug use: No     Comment: 01/31/2016 "none since ~ 1980"  . Sexual activity: No   Other Topics Concern  . None   Social History Narrative   Lives alone.  One daughter is local, but is getting ready to move to [place], where her children live with their father.  The other daughter lives near [place], [place].    Family History  Problem Relation Age of Onset  . Kidney disease Mother   . Heart disease Father   . Anuerysm Brother 29       brain  . Heart disease Brother   . Heart disease Sister 3       s/p CABG  . Hypertension Sister   . Colon cancer Neg Hx     Review of Systems  Constitutional: Negative for chills and fever.  Respiratory: Positive for cough (occ) and wheezing (occ). Negative for shortness of breath.   Cardiovascular: Negative for chest pain, palpitations and leg swelling.  Gastrointestinal: Negative for abdominal pain.  Musculoskeletal: Positive for back pain and myalgias.  Neurological: Positive for headaches (occ). Negative for dizziness and light-headedness.       Objective:   Vitals:   03/03/17 1119  BP: 114/64  Pulse: 94  Resp: 18  Temp: 98.2 F (36.8 C)   Wt Readings from Last 3 Encounters:  03/03/17 154 lb (69.9 kg)  02/19/17 159 lb (72.1 kg)  02/11/17 158 lb (71.7 kg)   Body mass index is 26.43 kg/m.   Physical Exam    Constitutional: Appears well-developed and well-nourished. No distress.    HENT:  Head: Normocephalic and atraumatic.  Neck: Neck supple. No tracheal deviation present. No thyromegaly present.  No cervical lymphadenopathy Cardiovascular: Normal rate, regular rhythm and normal heart sounds.   No murmur heard. No carotid bruit .  No edema Pulmonary/Chest: Effort normal and breath sounds normal. No respiratory distress. No has no wheezes. No rales.  Msk: tenderness and mild swelling in right lower back Skin: Skin is warm and dry. Not diaphoretic.  Psychiatric: Normal mood and affect. Behavior is normal.      Assessment & Plan:    See Problem List for Assessment and Plan of chronic medical problems.

## 2017-03-03 ENCOUNTER — Ambulatory Visit (INDEPENDENT_AMBULATORY_CARE_PROVIDER_SITE_OTHER): Payer: Medicare HMO | Admitting: Internal Medicine

## 2017-03-03 ENCOUNTER — Other Ambulatory Visit (INDEPENDENT_AMBULATORY_CARE_PROVIDER_SITE_OTHER): Payer: Medicare HMO

## 2017-03-03 ENCOUNTER — Telehealth: Payer: Self-pay | Admitting: Internal Medicine

## 2017-03-03 ENCOUNTER — Telehealth (INDEPENDENT_AMBULATORY_CARE_PROVIDER_SITE_OTHER): Payer: Self-pay | Admitting: Specialist

## 2017-03-03 ENCOUNTER — Encounter: Payer: Self-pay | Admitting: Internal Medicine

## 2017-03-03 VITALS — BP 114/64 | HR 94 | Temp 98.2°F | Resp 18 | Wt 154.0 lb

## 2017-03-03 DIAGNOSIS — R7303 Prediabetes: Secondary | ICD-10-CM

## 2017-03-03 DIAGNOSIS — I10 Essential (primary) hypertension: Secondary | ICD-10-CM

## 2017-03-03 DIAGNOSIS — E038 Other specified hypothyroidism: Secondary | ICD-10-CM

## 2017-03-03 DIAGNOSIS — M545 Low back pain, unspecified: Secondary | ICD-10-CM | POA: Insufficient documentation

## 2017-03-03 DIAGNOSIS — Z1159 Encounter for screening for other viral diseases: Secondary | ICD-10-CM | POA: Diagnosis not present

## 2017-03-03 DIAGNOSIS — K219 Gastro-esophageal reflux disease without esophagitis: Secondary | ICD-10-CM

## 2017-03-03 LAB — COMPREHENSIVE METABOLIC PANEL
ALT: 15 U/L (ref 0–35)
AST: 27 U/L (ref 0–37)
Albumin: 4.4 g/dL (ref 3.5–5.2)
Alkaline Phosphatase: 98 U/L (ref 39–117)
BUN: 17 mg/dL (ref 6–23)
CO2: 32 mEq/L (ref 19–32)
Calcium: 9.5 mg/dL (ref 8.4–10.5)
Chloride: 101 mEq/L (ref 96–112)
Creatinine, Ser: 1.12 mg/dL (ref 0.40–1.20)
GFR: 52.38 mL/min — ABNORMAL LOW (ref >60.00)
Glucose, Bld: 98 mg/dL (ref 70–99)
Potassium: 3.7 mEq/L (ref 3.5–5.1)
Sodium: 141 mEq/L (ref 135–145)
Total Bilirubin: 0.3 mg/dL (ref 0.2–1.2)
Total Protein: 7.5 g/dL (ref 6.0–8.3)

## 2017-03-03 LAB — TSH: TSH: 2.45 u[IU]/mL (ref 0.35–4.50)

## 2017-03-03 LAB — HEMOGLOBIN A1C: Hgb A1c MFr Bld: 6.1 % (ref 4.6–6.5)

## 2017-03-03 MED ORDER — ALBUTEROL SULFATE HFA 108 (90 BASE) MCG/ACT IN AERS: INHALATION_SPRAY | RESPIRATORY_TRACT | 0 refills | Status: DC

## 2017-03-03 NOTE — Assessment & Plan Note (Signed)
BP well controlled Current regimen effective and well tolerated Continue current medications at current doses cmp  

## 2017-03-03 NOTE — Telephone Encounter (Signed)
PT CALLED AND STATED HER PRIMARY CARE DR TOLD HER TO INFORM THAT HER L BUTTOCKS IS HURTING WHERE HER STRESS FRACTURE IS AT. PT ALSO WANTS TO KNOW HOW LONG SHE IS  SUPPOSE TO BE ON HER FEET DURING THE DAY.  [phone]

## 2017-03-03 NOTE — Patient Instructions (Signed)

## 2017-03-03 NOTE — Telephone Encounter (Signed)
FYI:  Received msg from [facility]:    Patient canceled apt for 03/04/17 no new apt has been [id number]

## 2017-03-03 NOTE — Telephone Encounter (Signed)
PT CALLED AND STATED HER PRIMARY CARE DR TOLD HER TO INFORM THAT HER L BUTTOCKS IS HURTING WHERE HER STRESS FRACTURE IS AT. PT ALSO WANTS TO KNOW HOW LONG SHE IS  SUPPOSE TO BE ON HER FEET DURING THE DAY.  [phone]  Please advise and I will call her back.

## 2017-03-03 NOTE — Assessment & Plan Note (Signed)
New, acute pain Right side Associated with some mild swelling Tender on exam She has a stress fracture in this area Will follow up with ortho - may need imaging

## 2017-03-03 NOTE — Assessment & Plan Note (Signed)
Has GERD 5/month Can take both omeprazole at the same time to ensure she takes both - if controlled continue with just one pill daily

## 2017-03-03 NOTE — Assessment & Plan Note (Signed)
Check tsh  Titrate med dose if needed  

## 2017-03-03 NOTE — Telephone Encounter (Signed)
noted 

## 2017-03-03 NOTE — Assessment & Plan Note (Signed)
Check a1c Low sugar / carb diet Stressed regular exercise, keeping weight down  

## 2017-03-04 ENCOUNTER — Encounter: Payer: Self-pay | Admitting: Internal Medicine

## 2017-03-04 ENCOUNTER — Institutional Professional Consult (permissible substitution): Payer: Medicare HMO | Admitting: Neurology

## 2017-03-04 ENCOUNTER — Other Ambulatory Visit: Payer: Self-pay | Admitting: Registered Nurse

## 2017-03-04 ENCOUNTER — Other Ambulatory Visit: Payer: Self-pay | Admitting: Internal Medicine

## 2017-03-04 LAB — HEPATITIS C ANTIBODY: HCV Ab: NEGATIVE

## 2017-03-07 ENCOUNTER — Other Ambulatory Visit (INDEPENDENT_AMBULATORY_CARE_PROVIDER_SITE_OTHER): Payer: Self-pay | Admitting: Specialist

## 2017-03-07 MED ORDER — VITAMIN D (ERGOCALCIFEROL) 1.25 MG (50000 UNIT) PO CAPS: 50000.0000 [IU] | ORAL_CAPSULE | ORAL | 0 refills | Status: DC

## 2017-03-07 NOTE — Telephone Encounter (Signed)
I called and lmom advising patient 

## 2017-03-07 NOTE — Telephone Encounter (Signed)
1/2 of her usual time on feet. Jill Phillips.

## 2017-03-10 NOTE — Progress Notes (Signed)
I called and advised patient that this rx was called in for her. And advsied about continuing with 2000 units afterward

## 2017-03-12 ENCOUNTER — Other Ambulatory Visit: Payer: Self-pay | Admitting: Internal Medicine

## 2017-03-12 DIAGNOSIS — I10 Essential (primary) hypertension: Secondary | ICD-10-CM

## 2017-03-15 IMAGING — CR DG CHEST 2V
2 series · 2 of 2 positions shown · non-contrast
Comparison: 02/07/2016.  02/04/2016.

CLINICAL DATA: History of pneumothorax.  Recent chest tube removal.

EXAM:
CHEST  2 VIEW

[w chest pa]
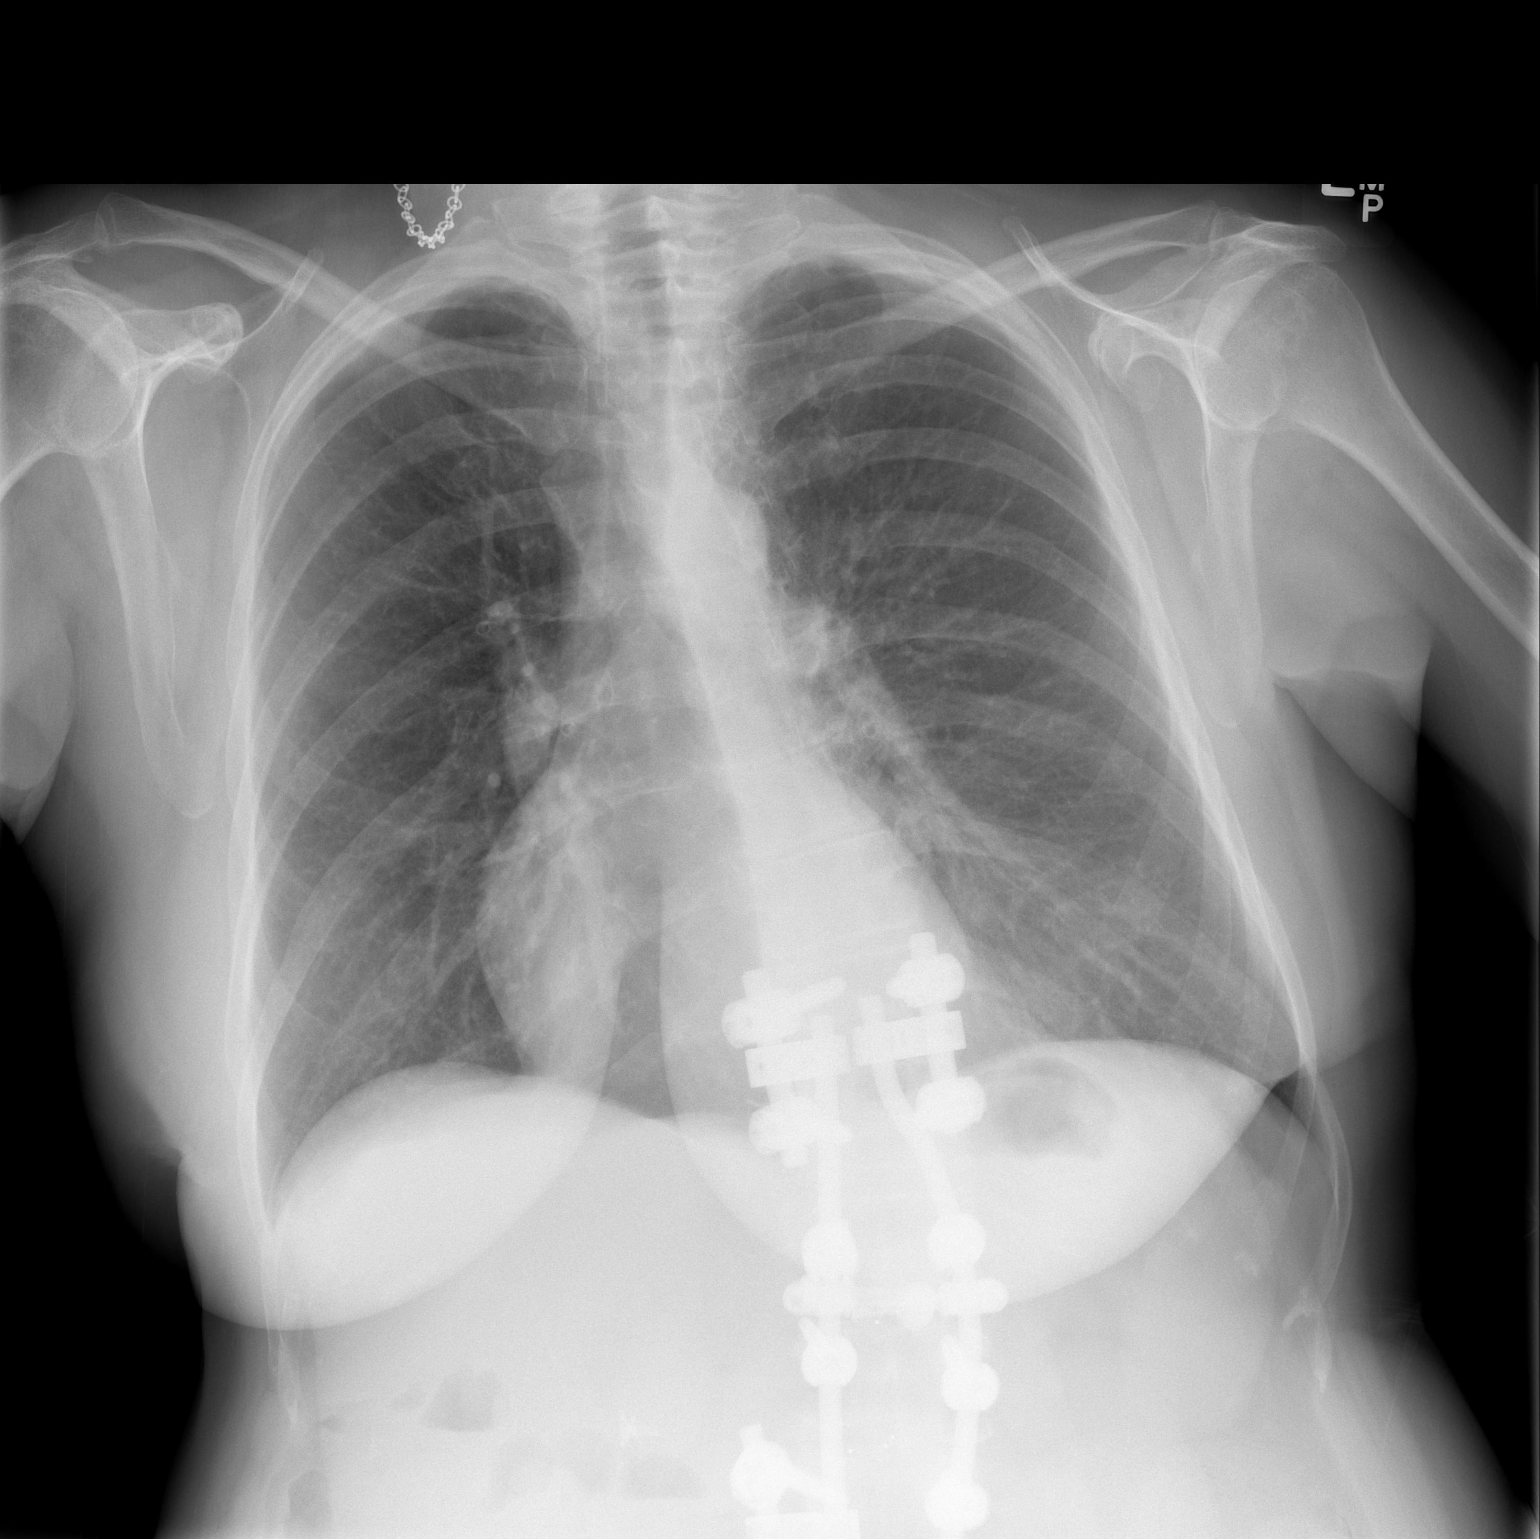

[w chest lat]
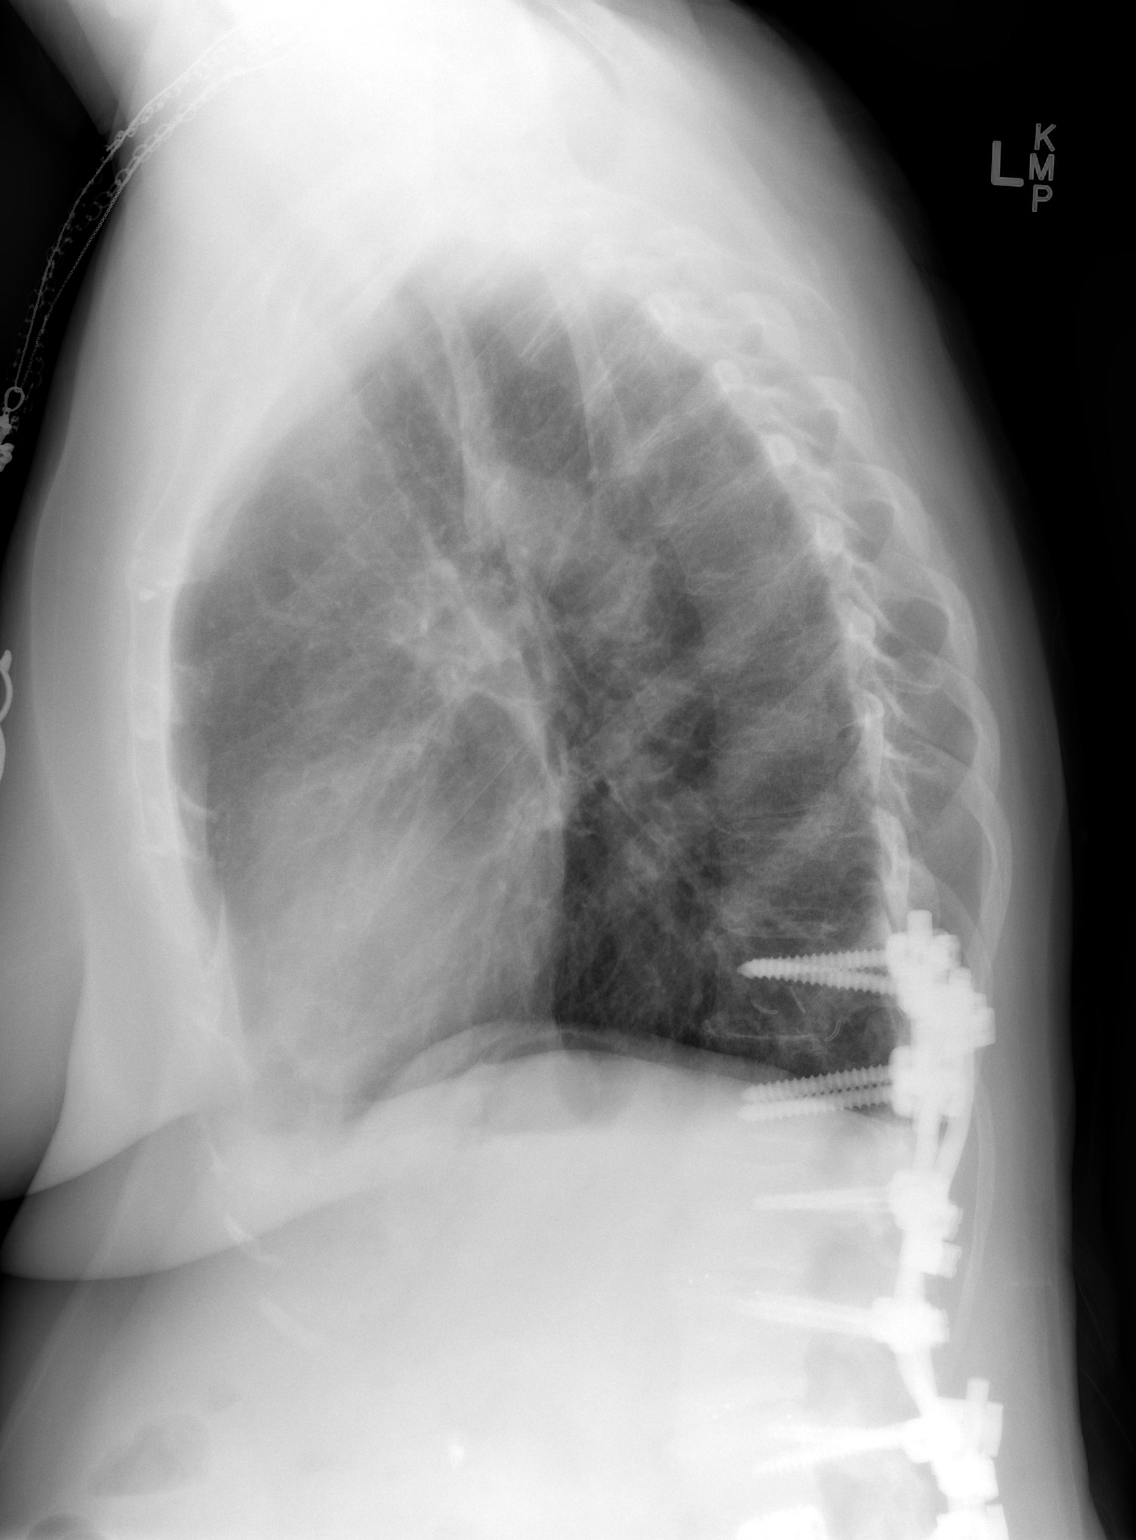

[2 of 2 positions shown; findings below may reference images not displayed]

FINDINGS: Mediastinum hilar structures are normal. Lungs are clear of acute
infiltrates. No pleural effusion or pneumothorax. No evidence
pneumothorax. Prior thoracolumbar spine fusion.
IMPRESSION: No active cardiopulmonary disease. No evidence of pneumothorax
following recent chest tube removal.

## 2017-03-20 ENCOUNTER — Telehealth: Payer: Self-pay

## 2017-03-20 NOTE — Telephone Encounter (Signed)
Called, wanting to know if she can be switched back to hydrocodone from the oxycodone, states current prescription is causing severe headaches and constipation

## 2017-03-21 ENCOUNTER — Encounter: Payer: Medicare HMO | Attending: Physical Medicine & Rehabilitation | Admitting: Registered Nurse

## 2017-03-21 VITALS — BP 111/68 | HR 94

## 2017-03-21 DIAGNOSIS — M62830 Muscle spasm of back: Secondary | ICD-10-CM

## 2017-03-21 DIAGNOSIS — Z9981 Dependence on supplemental oxygen: Secondary | ICD-10-CM | POA: Diagnosis not present

## 2017-03-21 DIAGNOSIS — M961 Postlaminectomy syndrome, not elsewhere classified: Secondary | ICD-10-CM

## 2017-03-21 DIAGNOSIS — M7918 Myalgia, other site: Secondary | ICD-10-CM

## 2017-03-21 DIAGNOSIS — M4316 Spondylolisthesis, lumbar region: Secondary | ICD-10-CM

## 2017-03-21 DIAGNOSIS — G2581 Restless legs syndrome: Secondary | ICD-10-CM | POA: Diagnosis not present

## 2017-03-21 DIAGNOSIS — K219 Gastro-esophageal reflux disease without esophagitis: Secondary | ICD-10-CM | POA: Diagnosis not present

## 2017-03-21 DIAGNOSIS — Z5181 Encounter for therapeutic drug level monitoring: Secondary | ICD-10-CM

## 2017-03-21 DIAGNOSIS — Z720 Tobacco use: Secondary | ICD-10-CM | POA: Diagnosis not present

## 2017-03-21 DIAGNOSIS — G9619 Other disorders of meninges, not elsewhere classified: Secondary | ICD-10-CM | POA: Insufficient documentation

## 2017-03-21 DIAGNOSIS — J449 Chronic obstructive pulmonary disease, unspecified: Secondary | ICD-10-CM | POA: Insufficient documentation

## 2017-03-21 DIAGNOSIS — M546 Pain in thoracic spine: Secondary | ICD-10-CM

## 2017-03-21 DIAGNOSIS — E785 Hyperlipidemia, unspecified: Secondary | ICD-10-CM | POA: Diagnosis not present

## 2017-03-21 DIAGNOSIS — I10 Essential (primary) hypertension: Secondary | ICD-10-CM | POA: Diagnosis not present

## 2017-03-21 DIAGNOSIS — Z79899 Other long term (current) drug therapy: Secondary | ICD-10-CM | POA: Diagnosis not present

## 2017-03-21 DIAGNOSIS — G894 Chronic pain syndrome: Secondary | ICD-10-CM | POA: Diagnosis not present

## 2017-03-21 DIAGNOSIS — Z9889 Other specified postprocedural states: Secondary | ICD-10-CM | POA: Insufficient documentation

## 2017-03-21 DIAGNOSIS — E079 Disorder of thyroid, unspecified: Secondary | ICD-10-CM | POA: Diagnosis not present

## 2017-03-21 DIAGNOSIS — G8929 Other chronic pain: Secondary | ICD-10-CM

## 2017-03-21 DIAGNOSIS — M2170 Unequal limb length (acquired), unspecified site: Secondary | ICD-10-CM | POA: Insufficient documentation

## 2017-03-21 DIAGNOSIS — G4709 Other insomnia: Secondary | ICD-10-CM

## 2017-03-21 DIAGNOSIS — M170 Bilateral primary osteoarthritis of knee: Secondary | ICD-10-CM | POA: Diagnosis not present

## 2017-03-21 DIAGNOSIS — E039 Hypothyroidism, unspecified: Secondary | ICD-10-CM | POA: Diagnosis not present

## 2017-03-21 DIAGNOSIS — M7062 Trochanteric bursitis, left hip: Secondary | ICD-10-CM | POA: Diagnosis not present

## 2017-03-21 DIAGNOSIS — M5126 Other intervertebral disc displacement, lumbar region: Secondary | ICD-10-CM | POA: Diagnosis not present

## 2017-03-21 DIAGNOSIS — M5136 Other intervertebral disc degeneration, lumbar region: Secondary | ICD-10-CM

## 2017-03-21 DIAGNOSIS — M791 Myalgia: Secondary | ICD-10-CM

## 2017-03-21 MED ORDER — HYDROCODONE-ACETAMINOPHEN 10-325 MG PO TABS: 1.0000 | ORAL_TABLET | Freq: Four times a day (QID) | ORAL | 0 refills | Status: DC | PRN

## 2017-03-21 MED ORDER — FENTANYL 50 MCG/HR TD PT72: 50.0000 ug | MEDICATED_PATCH | TRANSDERMAL | 0 refills | Status: DC

## 2017-03-21 NOTE — Progress Notes (Signed)
Subjective:    Patient ID: Jill Phillips, female    DOB: 04/11/1956, 61 y.o.   MRN: [id number]  HPI: [name] is a 61 year old female who returns for follow up appointmentfor chronic pain and medication refill. She states her pain is located in her mid-lower back, left hip and rectal pain.She rates her pain 6.Her current exercise regime is walking.  Jill Phillips analgesics were changed to Oxycodone last month she reports adverse effects of headache and constipation. Hydrocodone will be resumed, she verbalizes understanding.   Daughter in the room.  Last UDS was on 01/23/2017, it was consistent.  Pain Inventory Average Pain 5 Pain Right Now 6 My pain is sharp, burning and aching  In the last 24 hours, has pain interfered with the following? General activity 8 Relation with others 2 Enjoyment of life 6 What TIME of day is your pain at its worst? daytime and evening Sleep (in general) Good  Pain is worse with: walking, standing and some activites Pain improves with: rest, heat/ice and medication Relief from Meds: 8  Mobility walk without assistance how many minutes can you walk? 10 ability to climb steps?  no do you drive?  yes  Function Do you have any goals in this area?  no  Neuro/Psych spasms  Prior Studies Any changes since last visit?  no  Physicians involved in your care Any changes since last visit?  no   Family History  Problem Relation Age of Onset  . Kidney disease Mother   . Heart disease Father   . Anuerysm Brother 29       brain  . Heart disease Brother   . Heart disease Sister 48       s/p CABG  . Hypertension Sister   . Colon cancer Neg Hx    Social History   Social History  . Marital status: Divorced    Spouse name: n/a  . Number of children: 2  . Years of education: 12+   Occupational History  . disability     back surgeries   Social History Main Topics  . Smoking status: Current Every Day Smoker    Packs/day: 1.25     Years: 45.00    Types: Cigarettes  . Smokeless tobacco: Never Used     Comment: form given 12/25/16  . Alcohol use No  . Drug use: No     Comment: 01/31/2016 "none since ~ 1980"  . Sexual activity: No   Other Topics Concern  . Not on file   Social History Narrative   Lives alone.  One daughter is local, but is getting ready to move to [place], where her children live with their father.  The other daughter lives near [place], [place].   Past Surgical History:  Procedure Laterality Date  . APPENDECTOMY    . BACK SURGERY     18 back surgeries (2 thoracic & 16 lumbar) (01/31/2016)  . DILATION AND CURETTAGE OF UTERUS    . HAMMER TOE SURGERY    . JOINT REPLACEMENT    . KNEE ARTHROSCOPY Right   . LAPAROSCOPIC CHOLECYSTECTOMY    . LUMBAR FUSION N/A 08/01/2015   Procedure: Right sided L1-2 and L2-3 transforaminal lumbar interbody fusion with cages, Extension of posterior fusion T12 to L3, Replaced pedicle screws bilaterally L1-L2 , Replaced left sided pedicle screws L-3. Instrumentation T12 to L3 using local bone graft, Vivigen allograft and cancellous chips;  Surgeon: [name], MD;  Location: [facility];  Service: Orthopedics;  Laterality: N/A;  . LUMBAR LAMINECTOMY/DECOMPRESSION MICRODISCECTOMY N/A 01/28/2014   Procedure: Minimally Invasive Right  L1-2 Microdiscectomy;  Surgeon: [name], MD;  Location: [place];  Service: Orthopedics;  Laterality: N/A;  . TOTAL HIP ARTHROPLASTY Right   . TOTAL KNEE ARTHROPLASTY  05/29/2012   Procedure: TOTAL KNEE ARTHROPLASTY;  Surgeon: Mcarthur Rossetti, MD;  Location: [facility];  Service: Orthopedics;  Laterality: Right;  Right Total Knee Arthroplasty  . TUBAL LIGATION    . UVULOPALATOPHARYNGOPLASTY    . VAGINAL HYSTERECTOMY     Past Medical History:  Diagnosis Date  . Active smoker   . Anxiety   . Calcifying tendinitis of shoulder   . Chronic back pain   . Chronic pain syndrome   . COPD (chronic obstructive pulmonary disease) ([place])   .  Dysthymic disorder   . Emphysema lung ([place])   . GERD (gastroesophageal reflux disease)   . Headache    "weekly maybe" (01/31/2016)  . Heart murmur    for years, nothing to be concerned about  . Herpes genitalia   . History of blood transfusion 1980   related to "back surgery"  . Hyperlipidemia   . Hypertension   . Hypothyroidism   . Lumbago   . Osteoarthrosis, unspecified whether generalized or localized, lower leg   . Pain in joint, upper arm   . Pneumothorax, left 01/31/2016   S/P Left posterior subcostal pain injection on 01/30/2016  . PONV (postoperative nausea and vomiting)    gets nauseous  with longer surgery. Difficuty voiding after surgery  . Postlaminectomy syndrome, thoracic region   . Primary localized osteoarthrosis, lower leg   . Restless leg syndrome   . Sleep apnea    s/p surgery- last sleep study 2011- doesnt use oxygen or machine at night as instructed,.   12/2014- Dr Halford Chessman  reports it is negative.  . Thyroid disease    BP 111/68   Pulse 94   SpO2 90%   Opioid Risk Score:  6 Fall Risk Score:  `1  Depression screen PHQ 2/9  Depression screen [facility] 2/9 05/15/2016 05/02/2016 12/07/2015 11/15/2015 10/13/2015 09/06/2015 05/22/2015  Decreased Interest 0 0 0 0 0 0 0  Down, Depressed, Hopeless 0 0 0 0 - 0 0  PHQ - 2 Score 0 0 0 0 0 0 0  Altered sleeping - - - - - - -  Tired, decreased energy - - - - - - -  Change in appetite - - - - - - -  Feeling bad or failure about yourself  - - - - - - -  Trouble concentrating - - - - - - -  Moving slowly or fidgety/restless - - - - - - -  Suicidal thoughts - - - - - - -  PHQ-9 Score - - - - - - -  Some recent data might be hidden    Review of Systems  Constitutional: Negative.   HENT: Negative.   Eyes: Negative.   Respiratory: Negative.   Cardiovascular: Negative.   Gastrointestinal: Negative.   Endocrine: Negative.   Genitourinary: Negative.   Musculoskeletal:       Spasms  Skin: Negative.   Allergic/Immunologic:  Negative.   Neurological: Negative.   Hematological: Negative.   Psychiatric/Behavioral: Negative.   All other systems reviewed and are negative.      Objective:   Physical Exam  Constitutional: She is oriented to person, place, and time. She appears well-developed and  well-nourished.  HENT:  Head: Normocephalic and atraumatic.  Neck: Normal range of motion. Neck supple.  Cardiovascular: Normal rate and regular rhythm.   Pulmonary/Chest: Effort normal and breath sounds normal.  Musculoskeletal:  Normal Muscle Bulk and Muscle Testing Reveals: Upper Extremities: Full ROM and Muscle Strength 5/5 Thoracic Paraspinal Tenderness: T-7-T-9 Lumbar Paraspinal Tenderness: L-3-L-5 Lower Extremities: Full ROM and Muscle Strength 5/5 Arises from Table with ease Narrow Based gait  Neurological: She is alert and oriented to person, place, and time.  Skin: Skin is warm and dry.  Psychiatric: She has a normal mood and affect.  Nursing note and vitals reviewed.         Assessment & Plan:  1. Chronic lumbar spine pain/post-lami syndrome: 03/21/2017 Refilled: Fentanyl 50 MCG one patch every three days #10 and RX: Hydrocodone 10/325 mg one tablet every 6 hours as needed for pain #120. We will continue the opioid monitoring program, this consists of regular clinic visits, examinations, urine drug screen, pill counts as well as use of [place] Controlled Substance Reporting System. 2. Chronic Bilateral Thoracic Pain: Continue current medication regime.03/21/17 3. Osteoarthritis of the knees bilaterally:No complaints today. Continue with Heat, exercise and voltaren gel. 03/21/2017 4. Rotator cuff syndrome/subacromial bursitis: No complaints voiced today. 03/21/2017 5. Restless legs syndrome: Continue Requip.Continue to monitor. 03/21/2017 6. Tobacco Abuse: Encourage Smoking Cessation: Pulmonologist Following. 01/23/2017 7. Emphysema: Pulmonology Following. 03/21/2017 8. Muscle Spasm:  Continue Soma/ Tizanidine. 03/21/2017 9. Insomnia: Continue Seroquel. 03/21/2017 10. Neuropathic Pain: Continue Gabapentin.03/21/2017 11.Anxiety: Continue Klonopin. 03/21/2017  20  minutes of face to face patient care time was spent during this visit. All questions were encouraged and answered.   F/u in 1 month

## 2017-03-27 ENCOUNTER — Telehealth (INDEPENDENT_AMBULATORY_CARE_PROVIDER_SITE_OTHER): Payer: Self-pay | Admitting: Specialist

## 2017-03-27 NOTE — Telephone Encounter (Signed)
PT REQUESTED A CALL BACK TO DISCUSS HER PAIN, STATED SHE CANNOT DO ANYTHING AND IT IS RIDICULOUS THE PAIN SHE IS HAVING.

## 2017-03-27 NOTE — Telephone Encounter (Signed)
PT REQUESTED A CALL BACK TO DISCUSS HER PAIN, STATED SHE CANNOT DO ANYTHING AND IT IS RIDICULOUS THE PAIN SHE IS HAVING.  [phone]

## 2017-03-28 ENCOUNTER — Telehealth: Payer: Self-pay | Admitting: Physical Medicine & Rehabilitation

## 2017-03-28 NOTE — Telephone Encounter (Signed)
Contacted patient. I informed that since it is late Friday afternoon there is not much that can be done. Advised patient that the ED or Urgent care would be her best option.  I asked her what she was hoping for.  Initially, she said that  She was hopng for headache relief, then the conversation switched to her back pain, her neck pain, and perhaps they are all related. FYI......and are there any recommended treatment options

## 2017-03-28 NOTE — Telephone Encounter (Signed)
Is this patient scheduled for surgery? Is there a blue sheet pending. I will prescribe if we are planning to do surgery, otherwise she needs to contact pain management to have them increase the meds they are prescribing. She should not be getting narcotics from both of [place]. jen

## 2017-03-28 NOTE — Telephone Encounter (Signed)
Pt called checking status of request to discuss back pain. She hopes [name] will do surgery soon. pt says takes hydrocodone for pain from pain clinic. She can take four per day and does take that dose every day with no full resolve. Please advise.

## 2017-03-28 NOTE — Telephone Encounter (Signed)
I don't have anything new to offer her unless either [place] or I am able to examine her. Unfortunately, she has poor coping skills and high anxiety which drive a lot of her pain. I don't recall her complaining of headache before. I agree, that if her pain is so severe, she should go to urgent care.

## 2017-03-28 NOTE — Telephone Encounter (Signed)
Patient is having headaches all day long.  The only time she gets up is to go to bathroom and to eat.  Percocet was prescribed and that isn't helping it.  Please call patient.

## 2017-03-31 NOTE — Telephone Encounter (Signed)
I do have surgery sheet.  Just received medical clearance.  Dr. [name], please look at ED visit in June.  Do we need to get cardiac clearance?  Sudeep Scheibel

## 2017-04-01 ENCOUNTER — Ambulatory Visit (INDEPENDENT_AMBULATORY_CARE_PROVIDER_SITE_OTHER): Payer: Medicare HMO | Admitting: Specialist

## 2017-04-01 ENCOUNTER — Encounter (INDEPENDENT_AMBULATORY_CARE_PROVIDER_SITE_OTHER): Payer: Self-pay | Admitting: Specialist

## 2017-04-01 VITALS — BP 123/73 | HR 95 | Ht 64.0 in | Wt 159.0 lb

## 2017-04-01 DIAGNOSIS — M8448XG Pathological fracture, other site, subsequent encounter for fracture with delayed healing: Secondary | ICD-10-CM | POA: Diagnosis not present

## 2017-04-01 NOTE — Progress Notes (Signed)
Office Visit Note   Patient: Jill Phillips           Date of Birth: 04-09-1956           MRN: [id number] Visit Date: 04/01/2017              Requested by: [name], MD [place], [place] [address] PCP: [name], MD   Assessment & Plan: Visit Diagnoses: No diagnosis found.  Plan:Avoid frequent bending and stooping  No lifting greater than 10 lbs. May use ice or moist heat for pain. Weight loss is of benefit. Handicap license is approved. Use a cane or walker to decrease pain in the right pelvis, You are being scheduled for evaluation for a sacroplasty with Dr. Estanislado Pandy, interventional radiology at [facility]. We are considering intervention for your back in the form of augmentation of the fusion L1-2 and T12-L1 and left T9-10 foramenotomy but presently you are having more sacral pain. Therefore intervention for the right sacral insufficiency fracture is recommended first and then return to assess response to the sacroplasty. I recommend that you go to the Acute behavioral health clinic to be seen for depression, melancholy and in general a Problem of chronic pain causing significant loss of feelings of worth.  It is important to be seen and evaluated as the intervention that I am considering is only good for body dysfunction and unfortunately will not make your depression  Better.  Follow-Up Instructions: No Follow-up on file.   Orders:  No orders of the defined types were placed in this encounter.  No orders of the defined types were placed in this encounter.     Procedures: No procedures performed   Clinical Data: No additional findings.   Subjective: Chief Complaint  Patient presents with  . Lower Back - Follow-up    61 year old female status post T12 to L3 extension of fusion, history of previous L3-4.L4-5 and L5-S1 fusions and T11-T12 fusion. She has been having severe pain in her back and lumbosacral pain. She is having discomfort in the sacrum  centrally. MRI of the sacrum done 01/2017 showed sacral insufficiency.    Review of Systems  Constitutional: Negative.   HENT: Negative.   Eyes: Negative.   Respiratory: Negative.   Cardiovascular: Negative.   Gastrointestinal: Negative.   Endocrine: Negative.   Genitourinary: Negative.   Musculoskeletal: Negative.   Skin: Negative.   Allergic/Immunologic: Negative.   Neurological: Negative.   Hematological: Negative.   Psychiatric/Behavioral: Negative.      Objective: Vital Signs: BP 123/73 (BP Location: Left Arm, Patient Position: Sitting)   Pulse 95   Ht 5\' 4"  (1.626 m)   Wt 159 lb (72.1 kg)   BMI 27.29 kg/m   Physical Exam  Constitutional: She is oriented to person, place, and time. She appears well-developed and well-nourished.  HENT:  Head: Normocephalic and atraumatic.  Eyes: Pupils are equal, round, and reactive to light. EOM are normal.  Neck: Normal range of motion. Neck supple.  Pulmonary/Chest: Effort normal and breath sounds normal.  Abdominal: Soft. Bowel sounds are normal.  Neurological: She is alert and oriented to person, place, and time.  Skin: Skin is warm and dry.  Psychiatric: She has a normal mood and affect. Her behavior is normal. Judgment and thought content normal.    Back Exam   Tenderness  The patient is experiencing tenderness in the lumbar and sacroiliac.  Range of Motion  Extension: abnormal  Flexion: abnormal  Lateral Bend Right: abnormal  Lateral Bend Left: abnormal  Rotation Right: abnormal  Rotation Left: abnormal   Muscle Strength  Right Quadriceps:  5/5  Left Quadriceps:  5/5  Right Hamstrings:  5/5  Left Hamstrings:  5/5   Tests  Straight leg raise right: negative Straight leg raise left: negative  Reflexes  Patellar: 2/4 Achilles: 2/4 Biceps: 2/4 Babinski's sign: normal   Other  Toe Walk: normal Heel Walk: normal Sensation: normal Gait: normal  Erythema: no back redness Scars:  absent      Specialty Comments:  No specialty comments available.  Imaging: No results found.   PMFS History: Patient Active Problem List   Diagnosis Date Noted  . Degenerative disc disease, lumbar 08/01/2015    Priority: High    Class: Chronic  . Spondylolisthesis of lumbar region 08/01/2015    Priority: High    Class: Chronic  . Lower back pain 03/03/2017  . CKD (chronic kidney disease) stage 3, GFR 30-59 ml/min 08/07/2016  . GERD (gastroesophageal reflux disease) 08/07/2016  . Prediabetes 08/07/2016  . Chronic respiratory failure ([place]) 07/29/2016  . Hypersomnia 07/29/2016  . Arthritis of carpometacarpal [facility]) joint of left thumb 07/09/2016  . Lump of axilla 05/02/2016  . Pneumothorax on left 01/31/2016  . Chronic, continuous use of opioids 09/06/2015  . Urinary, incontinence, stress female 05/06/2014  . Constipation 05/05/2014  . 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 HTN (hypertension) 05/19/2013  . HLD (hyperlipidemia) 05/19/2013  . Depression 05/19/2013  . Hypothyroidism 05/19/2013  . Tobacco abuse   . Osteoarthritis of both knees 11/18/2011  . RLS (restless legs syndrome) 11/18/2011   Past Medical History:  Diagnosis Date  . Active smoker   . Anxiety   . Calcifying tendinitis of shoulder   . Chronic back pain   . Chronic pain syndrome   . COPD (chronic obstructive pulmonary disease) ([place])   . Dysthymic disorder   . Emphysema lung ([place])   . GERD (gastroesophageal reflux disease)   . Headache    "weekly maybe" (01/31/2016)  . Heart murmur    for years, nothing to be concerned about  . Herpes genitalia   . History of blood transfusion 1980   related to "back surgery"  . Hyperlipidemia   . Hypertension   . Hypothyroidism   . Lumbago   . Osteoarthrosis, unspecified whether generalized or localized, lower leg   . Pain in joint, upper arm   . Pneumothorax, left 01/31/2016   S/P Left posterior subcostal pain injection on 01/30/2016  . PONV (postoperative nausea  and vomiting)    gets nauseous  with longer surgery. Difficuty voiding after surgery  . Postlaminectomy syndrome, thoracic region   . Primary localized osteoarthrosis, lower leg   . Restless leg syndrome   . Sleep apnea    s/p surgery- last sleep study 2011- doesnt use oxygen or machine at night as instructed,.   12/2014- Dr Halford Chessman  reports it is negative.  . Thyroid disease     Family History  Problem Relation Age of Onset  . Kidney disease Mother   . Heart disease Father   . Anuerysm Brother 29       brain  . Heart disease Brother   . Heart disease Sister 62       s/p CABG  . Hypertension Sister   . Colon cancer Neg Hx     Past Surgical History:  Procedure Laterality Date  . APPENDECTOMY    . BACK SURGERY  18 back surgeries (2 thoracic & 16 lumbar) (01/31/2016)  . DILATION AND CURETTAGE OF UTERUS    . HAMMER TOE SURGERY    . JOINT REPLACEMENT    . KNEE ARTHROSCOPY Right   . LAPAROSCOPIC CHOLECYSTECTOMY    . LUMBAR FUSION N/A 08/01/2015   Procedure: Right sided L1-2 and L2-3 transforaminal lumbar interbody fusion with cages, Extension of posterior fusion T12 to L3, Replaced pedicle screws bilaterally L1-L2 , Replaced left sided pedicle screws L-3. Instrumentation T12 to L3 using local bone graft, Vivigen allograft and cancellous chips;  Surgeon: [name], MD;  Location: [place];  Service: Orthopedics;  Laterality: N/A;  . LUMBAR LAMINECTOMY/DECOMPRESSION MICRODISCECTOMY N/A 01/28/2014   Procedure: Minimally Invasive Right  L1-2 Microdiscectomy;  Surgeon: [name], MD;  Location: [place];  Service: Orthopedics;  Laterality: N/A;  . TOTAL HIP ARTHROPLASTY Right   . TOTAL KNEE ARTHROPLASTY  05/29/2012   Procedure: TOTAL KNEE ARTHROPLASTY;  Surgeon: Mcarthur Rossetti, MD;  Location: [facility];  Service: Orthopedics;  Laterality: Right;  Right Total Knee Arthroplasty  . TUBAL LIGATION    . UVULOPALATOPHARYNGOPLASTY    . VAGINAL HYSTERECTOMY     Social History    Occupational History  . disability     back surgeries   Social History Main Topics  . Smoking status: Current Every Day Smoker    Packs/day: 1.25    Years: 45.00    Types: Cigarettes  . Smokeless tobacco: Never Used     Comment: form given 12/25/16  . Alcohol use No  . Drug use: No     Comment: 01/31/2016 "none since ~ 1980"  . Sexual activity: No

## 2017-04-02 ENCOUNTER — Telehealth (INDEPENDENT_AMBULATORY_CARE_PROVIDER_SITE_OTHER): Payer: Self-pay

## 2017-04-02 ENCOUNTER — Other Ambulatory Visit: Payer: Self-pay | Admitting: Pulmonary Disease

## 2017-04-02 NOTE — Telephone Encounter (Signed)
Patient is asking for referral for 2nd opinion to another surgeon.  Please call. [phone].

## 2017-04-02 NOTE — Patient Instructions (Signed)
Avoid frequent bending and stooping  No lifting greater than 10 lbs. May use ice or moist heat for pain. Weight loss is of benefit. Handicap license is approved. Use a cane or walker to decrease pain in the right pelvis, You are being scheduled for evaluation for a sacroplasty with Dr. Estanislado Pandy, interventional radiology at [facility]. We are considering intervention for your back in the form of augmentation of the fusion L1-2 and T12-L1 and left T9-10 foramenotomy but presently you are having more sacral pain. Therefore intervention for the right sacral insufficiency fracture is recommended first and then return to assess response to the sacroplasty. I recommend that you go to the Acute behavioral health clinic to be seen for depression, melancholy and in general a Problem of chronic pain causing significant loss of feelings of worth.  It is important to be seen and evaluated as the intervention that I am considering is only good for body dysfunction and unfortunately will not make your depression  Better.

## 2017-04-02 NOTE — Telephone Encounter (Signed)
Patient is asking for referral for 2nd opinion to another surgeon.  Please call. [phone]

## 2017-04-03 ENCOUNTER — Ambulatory Visit (INDEPENDENT_AMBULATORY_CARE_PROVIDER_SITE_OTHER): Payer: Medicare HMO | Admitting: Specialist

## 2017-04-04 ENCOUNTER — Other Ambulatory Visit (INDEPENDENT_AMBULATORY_CARE_PROVIDER_SITE_OTHER): Payer: Self-pay | Admitting: Specialist

## 2017-04-04 DIAGNOSIS — M8448XG Pathological fracture, other site, subsequent encounter for fracture with delayed healing: Secondary | ICD-10-CM

## 2017-04-04 DIAGNOSIS — M961 Postlaminectomy syndrome, not elsewhere classified: Secondary | ICD-10-CM

## 2017-04-04 DIAGNOSIS — M5136 Other intervertebral disc degeneration, lumbar region: Secondary | ICD-10-CM

## 2017-04-04 DIAGNOSIS — M4724 Other spondylosis with radiculopathy, thoracic region: Secondary | ICD-10-CM

## 2017-04-04 NOTE — Telephone Encounter (Signed)
Hold on this patient's surgery until she has had a sacroplasty and I can assess her response to that procedure. Also it looks as though she is requesting a second opinion. Thanks, jen

## 2017-04-04 NOTE — Telephone Encounter (Signed)
As above, please hold on further scheduling of a surgery on this patient, she is asking for a second opinion and I have ordered a sacroplasty procedure for a sacral insufficiency fracture and would like to assess her response to the procedure. I've also requested that she be seen in behavioral health for significant signs of depression. jen

## 2017-04-04 NOTE — Telephone Encounter (Signed)
Does she have another surgeon that she wishes to see?  I think a second opinion is good, we will arrange, but I want her to be satisfied so if she has a Psychologist, sport and exercise in mind we will try to assist in getting this done otherwise will see if Dr. Lynann Bologna would be willing to see her. I placed the order of consult with Dr. Lynann Bologna. jen

## 2017-04-07 NOTE — Telephone Encounter (Signed)
Patient is aware that Dr. [name] has placed 2nd opinion consult with Dr. Lynann Bologna.  However patient stated how extremely confused she is about her care.  Stated someone with Dr. Annamaria Helling (from chart notes looks like the radiologist) about the cement to fix her back fracture and she was confused about that. She was very concerned that she did not make a follow up appointment with Dr. [name] after last visit, I gave her a 05/19/17 appointment as a follow up to go over care and clarification of care. She would like a call back from you when possible since you are familiar with her.

## 2017-04-08 ENCOUNTER — Other Ambulatory Visit: Payer: Self-pay | Admitting: Internal Medicine

## 2017-04-10 NOTE — Telephone Encounter (Signed)
I called and advised that Dr. [name] has referred her to Dr. Lynann Bologna for a second opinion and once they rec'd our info they have to review it and they will call her to sched appt.  Jill Phillips states that she understands

## 2017-04-12 ENCOUNTER — Other Ambulatory Visit: Payer: Self-pay | Admitting: Physical Medicine & Rehabilitation

## 2017-04-16 ENCOUNTER — Other Ambulatory Visit: Payer: Self-pay | Admitting: Internal Medicine

## 2017-04-16 ENCOUNTER — Other Ambulatory Visit: Payer: Self-pay | Admitting: Physical Medicine & Rehabilitation

## 2017-04-16 MED ORDER — PRAVASTATIN SODIUM 40 MG PO TABS
40.0000 mg | ORAL_TABLET | Freq: Every day | ORAL | 1 refills | Status: DC
Start: 2017-04-16 — End: 2017-09-08

## 2017-04-18 ENCOUNTER — Encounter: Payer: Medicare HMO | Attending: Physical Medicine & Rehabilitation | Admitting: Registered Nurse

## 2017-04-18 ENCOUNTER — Telehealth: Payer: Self-pay | Admitting: Registered Nurse

## 2017-04-18 ENCOUNTER — Telehealth: Payer: Self-pay | Admitting: Pulmonary Disease

## 2017-04-18 ENCOUNTER — Encounter: Payer: Self-pay | Admitting: Registered Nurse

## 2017-04-18 VITALS — BP 107/70 | HR 90 | Resp 16

## 2017-04-18 DIAGNOSIS — G2581 Restless legs syndrome: Secondary | ICD-10-CM | POA: Insufficient documentation

## 2017-04-18 DIAGNOSIS — Z720 Tobacco use: Secondary | ICD-10-CM | POA: Diagnosis not present

## 2017-04-18 DIAGNOSIS — M5126 Other intervertebral disc displacement, lumbar region: Secondary | ICD-10-CM | POA: Insufficient documentation

## 2017-04-18 DIAGNOSIS — M2170 Unequal limb length (acquired), unspecified site: Secondary | ICD-10-CM | POA: Diagnosis not present

## 2017-04-18 DIAGNOSIS — J449 Chronic obstructive pulmonary disease, unspecified: Secondary | ICD-10-CM | POA: Diagnosis not present

## 2017-04-18 DIAGNOSIS — G4709 Other insomnia: Secondary | ICD-10-CM | POA: Diagnosis not present

## 2017-04-18 DIAGNOSIS — Z9889 Other specified postprocedural states: Secondary | ICD-10-CM | POA: Diagnosis not present

## 2017-04-18 DIAGNOSIS — E785 Hyperlipidemia, unspecified: Secondary | ICD-10-CM | POA: Diagnosis not present

## 2017-04-18 DIAGNOSIS — Z5181 Encounter for therapeutic drug level monitoring: Secondary | ICD-10-CM | POA: Diagnosis not present

## 2017-04-18 DIAGNOSIS — E039 Hypothyroidism, unspecified: Secondary | ICD-10-CM | POA: Diagnosis not present

## 2017-04-18 DIAGNOSIS — M546 Pain in thoracic spine: Secondary | ICD-10-CM | POA: Diagnosis not present

## 2017-04-18 DIAGNOSIS — Z79899 Other long term (current) drug therapy: Secondary | ICD-10-CM | POA: Insufficient documentation

## 2017-04-18 DIAGNOSIS — I10 Essential (primary) hypertension: Secondary | ICD-10-CM | POA: Insufficient documentation

## 2017-04-18 DIAGNOSIS — M961 Postlaminectomy syndrome, not elsewhere classified: Secondary | ICD-10-CM | POA: Diagnosis not present

## 2017-04-18 DIAGNOSIS — G9619 Other disorders of meninges, not elsewhere classified: Secondary | ICD-10-CM | POA: Diagnosis not present

## 2017-04-18 DIAGNOSIS — M170 Bilateral primary osteoarthritis of knee: Secondary | ICD-10-CM | POA: Diagnosis not present

## 2017-04-18 DIAGNOSIS — M4316 Spondylolisthesis, lumbar region: Secondary | ICD-10-CM | POA: Diagnosis not present

## 2017-04-18 DIAGNOSIS — Z9981 Dependence on supplemental oxygen: Secondary | ICD-10-CM | POA: Diagnosis not present

## 2017-04-18 DIAGNOSIS — E079 Disorder of thyroid, unspecified: Secondary | ICD-10-CM | POA: Insufficient documentation

## 2017-04-18 DIAGNOSIS — M62830 Muscle spasm of back: Secondary | ICD-10-CM | POA: Diagnosis not present

## 2017-04-18 DIAGNOSIS — G894 Chronic pain syndrome: Secondary | ICD-10-CM

## 2017-04-18 DIAGNOSIS — M7062 Trochanteric bursitis, left hip: Secondary | ICD-10-CM | POA: Diagnosis not present

## 2017-04-18 DIAGNOSIS — K219 Gastro-esophageal reflux disease without esophagitis: Secondary | ICD-10-CM | POA: Insufficient documentation

## 2017-04-18 DIAGNOSIS — M7918 Myalgia, other site: Secondary | ICD-10-CM

## 2017-04-18 DIAGNOSIS — G8929 Other chronic pain: Secondary | ICD-10-CM

## 2017-04-18 DIAGNOSIS — M791 Myalgia: Secondary | ICD-10-CM

## 2017-04-18 DIAGNOSIS — M5136 Other intervertebral disc degeneration, lumbar region: Secondary | ICD-10-CM

## 2017-04-18 MED ORDER — QUETIAPINE FUMARATE 100 MG PO TABS: 100.0000 mg | ORAL_TABLET | Freq: Every day | ORAL | 2 refills | Status: DC

## 2017-04-18 MED ORDER — FENTANYL 50 MCG/HR TD PT72: 50.0000 ug | MEDICATED_PATCH | TRANSDERMAL | 0 refills | Status: DC

## 2017-04-18 MED ORDER — TIZANIDINE HCL 4 MG PO TABS: ORAL_TABLET | ORAL | 3 refills | Status: DC

## 2017-04-18 MED ORDER — HYDROCODONE-ACETAMINOPHEN 10-325 MG PO TABS: 1.0000 | ORAL_TABLET | Freq: Four times a day (QID) | ORAL | 0 refills | Status: DC | PRN

## 2017-04-18 MED ORDER — CARISOPRODOL 350 MG PO TABS: 350.0000 mg | ORAL_TABLET | Freq: Two times a day (BID) | ORAL | 2 refills | Status: DC | PRN

## 2017-04-18 NOTE — Telephone Encounter (Signed)
On 04/18/2017 the [place] was reviewed no conflict was seen on the [place] with multiple prescribers. Jill Phillips has a signed narcotic contract with our office. If there were any discrepancies this would have been reported to her physician.

## 2017-04-18 NOTE — Telephone Encounter (Signed)
Use oxygen as needed to keep saturation 88% and above Please make her an appointment for next week to be seen in his office visit

## 2017-04-18 NOTE — Progress Notes (Signed)
Subjective:    Patient ID: Jill Phillips, female    DOB: 03/18/1956, 61 y.o.   MRN: [id number]  HPI: [name] is a 61 year old female who returns for follow up appointmentfor chronic pain and medication refill. She states her pain is located in her mid-lower back, left hip and rectal pain.She rates her pain 6.Her current exercise regime is walking. Jill Phillips reports she is using her Soma BID prn , her count will be decreased from 90 to 60, she verbalizes understanding.   Jill Phillips arrived with oxygen desaturation 86% while walking to examination room. Saturation re-checked in the low 90"s. She refuses ED evaluation, she states she will call her Pulmonologist today.   Daughter in the room.  Last UDS was on 01/23/2017, it was consistent.  Pain Inventory Average Pain 5 Pain Right Now 6 My pain is dull and aching  In the last 24 hours, has pain interfered with the following? General activity 0 Relation with others 0 Enjoyment of life 0 What TIME of day is your pain at its worst? n/a Sleep (in general) NA  Pain is worse with: n/a Pain improves with: n/a Relief from Meds: n/a  Mobility walk without assistance how many minutes can you walk? 4 ability to climb steps?  no do you drive?  yes transfers alone  Function Do you have any goals in this area?  no  Neuro/Psych No problems in this area  Prior Studies Any changes since last visit?  no  Physicians involved in your care Any changes since last visit?  no   Family History  Problem Relation Age of Onset  . Kidney disease Mother   . Heart disease Father   . Anuerysm Brother 29       brain  . Heart disease Brother   . Heart disease Sister 71       s/p CABG  . Hypertension Sister   . Colon cancer Neg Hx    Social History   Social History  . Marital status: Divorced    Spouse name: n/a  . Number of children: 2  . Years of education: 12+   Occupational History  . disability     back  surgeries   Social History Main Topics  . Smoking status: Current Every Day Smoker    Packs/day: 1.25    Years: 45.00    Types: Cigarettes  . Smokeless tobacco: Never Used     Comment: form given 12/25/16  . Alcohol use No  . Drug use: No     Comment: 01/31/2016 "none since ~ 1980"  . Sexual activity: No   Other Topics Concern  . Not on file   Social History Narrative   Lives alone.  One daughter is local, but is getting ready to move to [place], where her children live with their father.  The other daughter lives near [place], [place].   Past Surgical History:  Procedure Laterality Date  . APPENDECTOMY    . BACK SURGERY     18 back surgeries (2 thoracic & 16 lumbar) (01/31/2016)  . DILATION AND CURETTAGE OF UTERUS    . HAMMER TOE SURGERY    . JOINT REPLACEMENT    . KNEE ARTHROSCOPY Right   . LAPAROSCOPIC CHOLECYSTECTOMY    . LUMBAR FUSION N/A 08/01/2015   Procedure: Right sided L1-2 and L2-3 transforaminal lumbar interbody fusion with cages, Extension of posterior fusion T12 to L3, Replaced pedicle screws bilaterally L1-L2 , Replaced left  sided pedicle screws L-3. Instrumentation T12 to L3 using local bone graft, Vivigen allograft and cancellous chips;  Surgeon: [name], MD;  Location: [place];  Service: Orthopedics;  Laterality: N/A;  . LUMBAR LAMINECTOMY/DECOMPRESSION MICRODISCECTOMY N/A 01/28/2014   Procedure: Minimally Invasive Right  L1-2 Microdiscectomy;  Surgeon: [name], MD;  Location: [place];  Service: Orthopedics;  Laterality: N/A;  . TOTAL HIP ARTHROPLASTY Right   . TOTAL KNEE ARTHROPLASTY  05/29/2012   Procedure: TOTAL KNEE ARTHROPLASTY;  Surgeon: Mcarthur Rossetti, MD;  Location: [facility];  Service: Orthopedics;  Laterality: Right;  Right Total Knee Arthroplasty  . TUBAL LIGATION    . UVULOPALATOPHARYNGOPLASTY    . VAGINAL HYSTERECTOMY     Past Medical History:  Diagnosis Date  . Active smoker   . Anxiety   . Calcifying tendinitis of shoulder   .  Chronic back pain   . Chronic pain syndrome   . COPD (chronic obstructive pulmonary disease) ([place])   . Dysthymic disorder   . Emphysema lung ([place])   . GERD (gastroesophageal reflux disease)   . Headache    "weekly maybe" (01/31/2016)  . Heart murmur    for years, nothing to be concerned about  . Herpes genitalia   . History of blood transfusion 1980   related to "back surgery"  . Hyperlipidemia   . Hypertension   . Hypothyroidism   . Lumbago   . Osteoarthrosis, unspecified whether generalized or localized, lower leg   . Pain in joint, upper arm   . Pneumothorax, left 01/31/2016   S/P Left posterior subcostal pain injection on 01/30/2016  . PONV (postoperative nausea and vomiting)    gets nauseous  with longer surgery. Difficuty voiding after surgery  . Postlaminectomy syndrome, thoracic region   . Primary localized osteoarthrosis, lower leg   . Restless leg syndrome   . Sleep apnea    s/p surgery- last sleep study 2011- doesnt use oxygen or machine at night as instructed,.   12/2014- Dr Halford Chessman  reports it is negative.  . Thyroid disease    There were no vitals taken for this visit.  Opioid Risk Score:   Fall Risk Score:  `1  Depression screen PHQ 2/9  Depression screen [facility] 2/9 05/15/2016 05/02/2016 12/07/2015 11/15/2015 10/13/2015 09/06/2015 05/22/2015  Decreased Interest 0 0 0 0 0 0 0  Down, Depressed, Hopeless 0 0 0 0 - 0 0  PHQ - 2 Score 0 0 0 0 0 0 0  Altered sleeping - - - - - - -  Tired, decreased energy - - - - - - -  Change in appetite - - - - - - -  Feeling bad or failure about yourself  - - - - - - -  Trouble concentrating - - - - - - -  Moving slowly or fidgety/restless - - - - - - -  Suicidal thoughts - - - - - - -  PHQ-9 Score - - - - - - -  Some recent data might be hidden      Review of Systems  All other systems reviewed and are negative.      Objective:   Physical Exam  Constitutional: She is oriented to person, place, and time. She appears  well-developed and well-nourished.  HENT:  Head: Normocephalic and atraumatic.  Neck: Normal range of motion. Neck supple.  Cardiovascular: Normal rate and regular rhythm.   Pulmonary/Chest: Effort normal and breath sounds normal.  Musculoskeletal:  Normal  Muscle Bulk and Muscle Testing Reveals: Upper Extremities: Full ROM and Muscle Strength 5/5 Lumbar Paraspinal Tenderness: L-4-L-5 Lower Extremities: Full ROM and Muscle Strength 5/5 Arises from Table with ease Narrow Based gait   Neurological: She is alert and oriented to person, place, and time.  Skin: Skin is warm and dry.  Psychiatric: She has a normal mood and affect.  Nursing note and vitals reviewed.         Assessment & Plan:  1. Chronic lumbar spine pain/post-lami syndrome: 04/18/2017 Refilled: Fentanyl 50 MCG one patch every three days #10 andHydrocodone 10/325 mg one tablet every 6 hours as needed for pain #120. We will continue the opioid monitoring program, this consists of regular clinic visits, examinations, urine drug screen, pill counts as well as use of [place] Controlled Substance Reporting System. 2. Chronic Bilateral Thoracic Pain: Continue current medication regime.04/18/17 3. Osteoarthritis of the knees bilaterally:No complaints today. Continue with Heat, exercise and voltaren gel. 04/18/2017 4. Rotator cuff syndrome/subacromial bursitis: No complaints voiced today.  08/ 17/2018 5. Restless legs syndrome: Continue Requip.Continue to monitor. 04/18/2017 6. Tobacco Abuse: Encourage Smoking Cessation: Pulmonologist Following. 04/18/2017 7. Emphysema/ Desaturation: Instructed to call her Pulmonologist Following. 04/18/2017 8. Muscle Spasm: Continue Soma/ Tizanidine. 04/18/2017 9. Insomnia: Continue Seroquel. 04/18/2017 10. Neuropathic Pain: Continue Gabapentin.04/18/2017 11.Anxiety: Continue Klonopin. 04/18/2017  20 minutes of face to face patient care time was spent during this visit. All  questions were encouraged and answered.  F/u in 1 month

## 2017-04-18 NOTE — Telephone Encounter (Signed)
Pt states that she does not use oxygen. Appt has been made with TP on Monday 20th at [place] patient that if she feels any worse to go to the nearest UC or ER.

## 2017-04-18 NOTE — Telephone Encounter (Signed)
Pt states that when she woke up this morning she felt like that was unable to catch her breath, take a deep breath.  Pt states that her O2 dropped down into the 80s, lowest 83% and highest 91%. Does not wear O2.  O2 ranging low all day, lower when talking.  Pt has used her Albuterol HFA twice today -- no relief  Pt advised by Dr Tessa Lerner pain clinic office to contact our office for rec's.  Pt c/o of left sided chest tightness/heaviness in upper left chest above breast and below collar bone. Pt states its not the same tightness as normally for her breathing. Pt c/o HA x 1-2 week, temples and top of head.   Pt not sure if related to anxiety -- pt states that she does not feel anxious.   Notes from today's visit with Pain Clinic: Jill Phillips arrived with oxygen desaturation 86% while walking to examination room. Saturation re-checked in the low 90"s. She refuses ED evaluation, she states she will call her Pulmonologist today.   Please advise Dr Elsworth Soho as Dr Halford Chessman is not available. Thanks.

## 2017-04-21 ENCOUNTER — Ambulatory Visit (INDEPENDENT_AMBULATORY_CARE_PROVIDER_SITE_OTHER)
Admission: RE | Admit: 2017-04-21 | Discharge: 2017-04-21 | Disposition: A | Discharge status: Home / Self Care | Payer: Medicare HMO | Source: Ambulatory Visit | Attending: Adult Health | Admitting: Adult Health

## 2017-04-21 ENCOUNTER — Ambulatory Visit (INDEPENDENT_AMBULATORY_CARE_PROVIDER_SITE_OTHER): Payer: Medicare HMO | Admitting: Adult Health

## 2017-04-21 ENCOUNTER — Encounter: Payer: Self-pay | Admitting: Adult Health

## 2017-04-21 VITALS — BP 112/64 | HR 97 | Ht 63.0 in | Wt 155.6 lb

## 2017-04-21 DIAGNOSIS — J439 Emphysema, unspecified: Secondary | ICD-10-CM

## 2017-04-21 DIAGNOSIS — J9611 Chronic respiratory failure with hypoxia: Secondary | ICD-10-CM | POA: Diagnosis not present

## 2017-04-21 DIAGNOSIS — J449 Chronic obstructive pulmonary disease, unspecified: Secondary | ICD-10-CM | POA: Diagnosis not present

## 2017-04-21 DIAGNOSIS — R079 Chest pain, unspecified: Secondary | ICD-10-CM | POA: Diagnosis not present

## 2017-04-21 NOTE — Assessment & Plan Note (Signed)
No sign of desats with walking today in office  No need for o2

## 2017-04-21 NOTE — Progress Notes (Signed)
@Patient  ID: Jill Phillips, female    DOB: 01/23/1956, 61 y.o.   MRN: [id number]  Chief Complaint  Patient presents with  . Acute Visit    Cough     Referring provider: [name], MD  HPI: 61  yo female followed for sleep issues in past and nocturnal hypoxia on home O2 at 1l/m At bedtime  And Emphysema  Hx of OSA s/p UPPP w/ repeat sleep study in 2015 with no OSA noted.   TEST PSG 05/21/13>>AHI 15.7, SaO2 low 82% PFT 05/26/14>>FEV1 2.28 (88%), FEV1% 86, TLC 4.05 (80%), DLCO 65% PSG 08/29/14>>AHI 1.1, SaO2 low 82%, spent 33.7 min with SaO2 <88%, used 1 liter oxygen CT chest 11/23/14>>mild paraseptal emphysema at apices  04/21/2017 Acute OV : Cough  Pt presents for an acute office visit. Complains of 1 week increased sob, wheezing and cough . Had some congestion initially but this has resolved.  Cough is dry w/ no congestion. Concerned her oxygen level might be going down. Symptoms seem to be getting better. Wants to make sure nothing is going on . Remains on Spiriva daily.  On ACE inhibitor . Denies any significant cough .  Walk test in office with no desats <90%.  Spirometry today is FEV1 81%  Ratio 87, FVC 72%. No sign change since 2015.   Still smoking 1.5ppd. Discussed smoking cessation. Would like to try patches again to help with smoking cessation  She denies chest pain, orthopnea, edema , fever, or hemoptysis.    Allergies  Allergen Reactions  . Flagyl [Metronidazole] Diarrhea  . Sulfa Antibiotics Other (See Comments)    Yeast infection in mouth  . Amoxicillin Other (See Comments)    REACTION: Oral yeast infection Has patient had a PCN reaction causing immediate rash, facial/tongue/throat swelling, SOB or lightheadedness with hypotension: No Has patient had a PCN reaction causing severe rash involving mucus membranes or skin necrosis: No Has patient had a PCN reaction that required hospitalization No Has patient had a PCN reaction occurring within the last  10 years: No If all of the above answers are "NO", then may proceed with Cephalosporin use.   . Chlorzoxazone Other (See Comments)    headache  . Codeine Other (See Comments)    headache  . Darvocet [Propoxyphene N-Acetaminophen] Itching  . Dilaudid [Hydromorphone Hcl] Itching  . Keflex [Cephalexin] Other (See Comments)    Pt does not recall reaction (maybe yeast infection)  . Morphine And Related Itching  . Nitrofurantoin Monohyd Macro Hives    Reaction to [employer]  . Percocet [Oxycodone-Acetaminophen] Itching    Patient can tolerate Acetaminophen solely    Immunization History  Administered Date(s) Administered  . Influenza, Seasonal, Injecte, Preservative Fre 08/05/2012  . Influenza,inj,Quad PF,36+ Mos 05/05/2014, 05/22/2015, 07/29/2016  . Tdap 07/14/2013    Past Medical History:  Diagnosis Date  . Active smoker   . Anxiety   . Calcifying tendinitis of shoulder   . Chronic back pain   . Chronic pain syndrome   . COPD (chronic obstructive pulmonary disease) ([place])   . Dysthymic disorder   . Emphysema lung ([place])   . GERD (gastroesophageal reflux disease)   . Headache    "weekly maybe" (01/31/2016)  . Heart murmur    for years, nothing to be concerned about  . Herpes genitalia   . History of blood transfusion 1980   related to "back surgery"  . Hyperlipidemia   . Hypertension   . Hypothyroidism   . Lumbago   .  Osteoarthrosis, unspecified whether generalized or localized, lower leg   . Pain in joint, upper arm   . Pneumothorax, left 01/31/2016   S/P Left posterior subcostal pain injection on 01/30/2016  . PONV (postoperative nausea and vomiting)    gets nauseous  with longer surgery. Difficuty voiding after surgery  . Postlaminectomy syndrome, thoracic region   . Primary localized osteoarthrosis, lower leg   . Restless leg syndrome   . Sleep apnea    s/p surgery- last sleep study 2011- doesnt use oxygen or machine at night as instructed,.   12/2014- Dr Halford Chessman  reports  it is negative.  . Thyroid disease     Tobacco History: History  Smoking Status  . Current Every Day Smoker  . Packs/day: 1.25  . Years: 45.00  . Types: Cigarettes  Smokeless Tobacco  . Never Used    Comment: form given 12/25/16   Ready to quit: No Counseling given: Yes   Outpatient Encounter Prescriptions as of 04/21/2017  Medication Sig  . acetaminophen (TYLENOL) 500 MG tablet Take 1,000 mg by mouth every 6 (six) hours as needed for headache.   . albuterol (PROAIR HFA) 108 (90 Base) MCG/ACT inhaler INHALE 1 PUFF INTO THE LUNGS EVERY 6 HOURS AS NEEDED FOR WHEEZING OR SHORTNESS OF BREATH.NEED OFFICE VISIT FOR FURTHER REFILLS  . albuterol (VENTOLIN HFA) 108 (90 Base) MCG/ACT inhaler INHALE 1 PUFF INTO THE LUNGS EVERY 6 HOURS AS NEEDED FOR WHEEZING OR SHORTNESS OF BREATH.NEED OFFICE VISIT FOR FURTHER REFILLS  . amLODipine (NORVASC) 10 MG tablet Take 1 tablet (10 mg total) by mouth daily.  . benazepril (LOTENSIN) 40 MG tablet TAKE 1 TABLET (40 MG TOTAL) BY MOUTH DAILY.  . carisoprodol (SOMA) 350 MG tablet Take 1 tablet (350 mg total) by mouth 2 (two) times daily as needed for muscle spasms.  . clonazePAM (KLONOPIN) 2 MG tablet TAKE 1 TABLET BY MOUTH 2 TIMES A DAY AS NEEDED.  . fentaNYL (DURAGESIC - DOSED MCG/HR) 50 MCG/HR Place 1 patch (50 mcg total) onto the skin every 3 (three) days.  [name] gabapentin (NEURONTIN) 300 MG capsule Take 1 capsule (300 mg total) by mouth 4 (four) times daily.  . hydrochlorothiazide (HYDRODIURIL) 12.5 MG tablet Take 1 tablet (12.5 mg total) by mouth daily.  [name] HYDROcodone-acetaminophen (NORCO) 10-325 MG tablet Take 1 tablet by mouth every 6 (six) hours as needed.  [name] levothyroxine (SYNTHROID, LEVOTHROID) 75 MCG tablet Take 1 tablet (75 mcg total) by mouth daily before breakfast.  . naproxen (NAPROSYN) 500 MG tablet TAKE 1 TABLET BY MOUTH TWICE A DAY WITH FOOD.  [name] omeprazole (PRILOSEC) 20 MG capsule TAKE 1 CAPSULE BY MOUTH 2 TIMES DAILY BEFORE A MEAL.  .  pravastatin (PRAVACHOL) 40 MG tablet Take 1 tablet (40 mg total) by mouth daily.  . QUEtiapine (SEROQUEL) 100 MG tablet Take 1 tablet (100 mg total) by mouth at bedtime.  [name] rOPINIRole (REQUIP) 0.5 MG tablet TAKE 1 TABLET BY MOUTH AT BEDTIME FOR 1 WEEK THEN INCREASE TO 2 TABLETS AT BEDTIME. (STOP PRIMIDONE)  . SPIRIVA HANDIHALER 18 MCG inhalation capsule PLACE 1 CAPSULE INTO INHALER AND INHALE ONCE DAILY.  [name] tiZANidine (ZANAFLEX) 4 MG tablet TAKE 1 TABLET BY MOUTH EVERY 8 HOURS AS NEEDED FOR MUSCLE PAIN/ SPASMS  . valACYclovir (VALTREX) 500 MG tablet TAKE 1 TABLET BY MOUTH DAILY.  [name] venlafaxine XR (EFFEXOR-XR) 150 MG 24 hr capsule TAKE 2 CAPSULES BY MOUTH DAILY.  [name] Vitamin D, Ergocalciferol, (DRISDOL) 50000 units CAPS capsule Take 1 capsule (  50,000 Units total) by mouth every 7 (seven) days.  . VOLTAREN 1 % GEL APPLY 2 GRAMS TO AFFECTED AREA 3 TIMES A DAY AS NEEDED FOR PAIN.   Facility-Administered Encounter Medications as of 04/21/2017  Medication  . calcitonin (salmon) (MIACALCIN/FORTICAL) nasal spray 1 spray     Review of Systems  Constitutional:   No  weight loss, night sweats,  Fevers, chills, fatigue, or  lassitude.  HEENT:   No headaches,  Difficulty swallowing,  Tooth/dental problems, or  Sore throat,                No sneezing, itching, ear ache, nasal congestion, post nasal drip,   CV:  No chest pain,  Orthopnea, PND, swelling in lower extremities, anasarca, dizziness, palpitations, syncope.   GI  No heartburn, indigestion, abdominal pain, nausea, vomiting, diarrhea, change in bowel habits, loss of appetite, bloody stools.   Resp:    No chest wall deformity  Skin: no rash or lesions.  GU: no dysuria, change in color of urine, no urgency or frequency.  No flank pain, no hematuria        Physical Exam  BP 112/64 (BP Location: Left Arm, Cuff Size: Normal)   Pulse 97   Ht 5\' 3"  (1.6 m)   Wt 155 lb 9.6 oz (70.6 kg)   SpO2 95%   BMI 27.56 kg/m   GEN: A/Ox3; pleasant ,  NAD, well nourished    HEENT:  [place]/AT,  EACs-clear, TMs-wnl, NOSE-clear, THROAT-clear, no lesions, no postnasal drip or exudate noted.   NECK:  Supple w/ fair ROM; no JVD; normal carotid impulses w/o bruits; no thyromegaly or nodules palpated; no lymphadenopathy.    RESP  Clear  P & A; w/o, wheezes/ rales/ or rhonchi. no accessory muscle use, no dullness to percussion  CARD:  RRR, no m/r/g, no peripheral edema, pulses intact, no cyanosis or clubbing.  GI:   Soft & nt; nml bowel sounds; no organomegaly or masses detected.   Musco: Warm bil, no deformities or joint swelling noted.   Neuro: alert, no focal deficits noted.    Skin: Warm, no lesions or rashes    Lab Results:    BNP No results found for: BNP  ProBNP No results found for: PROBNP  Imaging: No results found.   Assessment & Plan:   Chronic respiratory failure (HCC) No sign of desats with walking today in office  No need for o2   COPD (chronic obstructive pulmonary disease) (HCC) No sign obstruction on PFT  Emphysema on CT chest  Cont on Spiriva daily  Smoking cessation   Plan  Patient Instructions  Mucinex DM Twice daily  As needed  Cough/congestion  Continue on Spiriva daily .  Work on not smoking .  May try nicoderm patches to help with quitting smoking .  Chest xray today .  Follow up with Dr. Halford Chessman  In 3 months and As needed          Rexene Edison, NP 04/21/2017

## 2017-04-21 NOTE — Patient Instructions (Signed)
Mucinex DM Twice daily  As needed  Cough/congestion  Continue on Spiriva daily .  Work on not smoking .  May try nicoderm patches to help with quitting smoking .  Chest xray today .  Follow up with Dr. Halford Chessman  In 3 months and As needed

## 2017-04-21 NOTE — Assessment & Plan Note (Signed)
No sign obstruction on PFT  Emphysema on CT chest  Cont on Spiriva daily  Smoking cessation   Plan  Patient Instructions  Mucinex DM Twice daily  As needed  Cough/congestion  Continue on Spiriva daily .  Work on not smoking .  May try nicoderm patches to help with quitting smoking .  Chest xray today .  Follow up with Dr. Halford Chessman  In 3 months and As needed

## 2017-04-21 NOTE — Addendum Note (Signed)
Addended by: Parke Poisson E on: 04/21/2017 12:36 PM   Modules accepted: Orders

## 2017-04-22 DIAGNOSIS — M545 Low back pain: Secondary | ICD-10-CM | POA: Diagnosis not present

## 2017-04-22 NOTE — Progress Notes (Signed)
I have reviewed and agree with assessment/plan.  Chesley Mires, MD [facility] Pulmonary/Critical Care 04/22/2017, 7:42 AM Pager:  [phone]

## 2017-05-01 ENCOUNTER — Telehealth (INDEPENDENT_AMBULATORY_CARE_PROVIDER_SITE_OTHER): Payer: Self-pay

## 2017-05-01 NOTE — Telephone Encounter (Signed)
Patient has seen Dr. Lynann Bologna and he also recommends procedure with Dr. Estanislado Pandy.  Please refer to Dr. Estanislado Pandy again.

## 2017-05-02 NOTE — Telephone Encounter (Signed)
Jill Phillips is working on this

## 2017-05-06 ENCOUNTER — Other Ambulatory Visit: Payer: Self-pay | Admitting: Internal Medicine

## 2017-05-06 NOTE — Telephone Encounter (Signed)
I think she may have discussed this with pulmonary but I can send in.   What strength does she want.  21 mg patch is equivalent to smoking one pack per day.   If she smokes less we can send a lower dose.

## 2017-05-06 NOTE — Telephone Encounter (Signed)
Pt called in and said that Dr burns was suppose to call in Nicoderm patchs in for her.  I did not see them.  Were they suppose to be called in?

## 2017-05-07 MED ORDER — NICOTINE 21 MG/24HR TD PT24: 21.0000 mg | MEDICATED_PATCH | Freq: Every day | TRANSDERMAL | 1 refills | Status: DC

## 2017-05-07 NOTE — Telephone Encounter (Signed)
Pt is wanting the 21 mg. Verified pharmacy sent rx to [facility]...Jill Phillips

## 2017-05-08 ENCOUNTER — Other Ambulatory Visit: Payer: Self-pay | Admitting: Adult Health

## 2017-05-12 ENCOUNTER — Ambulatory Visit (INDEPENDENT_AMBULATORY_CARE_PROVIDER_SITE_OTHER): Payer: Medicare HMO

## 2017-05-12 ENCOUNTER — Ambulatory Visit (INDEPENDENT_AMBULATORY_CARE_PROVIDER_SITE_OTHER): Payer: Medicare HMO | Admitting: Specialist

## 2017-05-12 ENCOUNTER — Encounter (INDEPENDENT_AMBULATORY_CARE_PROVIDER_SITE_OTHER): Payer: Self-pay | Admitting: Specialist

## 2017-05-12 VITALS — BP 133/74 | HR 85 | Ht 64.0 in | Wt 159.0 lb

## 2017-05-12 DIAGNOSIS — M8448XG Pathological fracture, other site, subsequent encounter for fracture with delayed healing: Secondary | ICD-10-CM

## 2017-05-12 DIAGNOSIS — M25552 Pain in left hip: Secondary | ICD-10-CM

## 2017-05-12 DIAGNOSIS — M25562 Pain in left knee: Secondary | ICD-10-CM

## 2017-05-12 DIAGNOSIS — M533 Sacrococcygeal disorders, not elsewhere classified: Secondary | ICD-10-CM | POA: Diagnosis not present

## 2017-05-12 DIAGNOSIS — M1712 Unilateral primary osteoarthritis, left knee: Secondary | ICD-10-CM

## 2017-05-12 MED ORDER — METHYLPREDNISOLONE ACETATE 40 MG/ML IJ SUSP
40.0000 mg | INTRAMUSCULAR | Status: AC | PRN
  Administered 2017-05-12: 40 mg via INTRA_ARTICULAR

## 2017-05-12 MED ORDER — BUPIVACAINE HCL 0.25 % IJ SOLN
4.0000 mL | INTRAMUSCULAR | Status: AC | PRN
  Administered 2017-05-12: 4 mL via INTRA_ARTICULAR

## 2017-05-12 NOTE — Progress Notes (Addendum)
Office Visit Note   Patient: Jill Phillips           Date of Birth: [birth year]           MRN: [id number] Visit Date: 05/12/2017              Requested by: [name], MD [place], [place] [address] PCP: [name], MD   Assessment & Plan: Visit Diagnoses:  1. Acute pain of left knee   2. Pain in left hip   3. Sacral pain   4. Sacral insufficiency fracture with delayed healing     Plan: Knee is suffering from osteoarthritis, only real proven treatments are Weight loss, NSIADs like naprosyn and exercise. Well padded shoes help. Ice the knee 2-3 times a day 15-20 mins at a time. MRI of the left hip is ordered to assess for stress fracture. Dr. Estanislado Pandy consult to consider for sacroplasty is resubmitted.  Follow-Up Instructions: Return in about 3 weeks (around 06/02/2017).   Orders:  Orders Placed This Encounter  Procedures  . Large Joint Injection/Arthrocentesis  . XR Knee 1-2 Views Left  . XR HIP UNILAT W OR W/O PELVIS 2-3 VIEWS LEFT  . MR Hip Left w/ contrast  . Ambulatory referral to Interventional Radiology   No orders of the defined types were placed in this encounter.     Procedures: Large Joint Inj Date/Time: 05/12/2017 4:11 PM Performed by: [name] Authorized by: [name]   Consent Given by:  Patient Site marked: the procedure site was marked   Timeout: prior to procedure the correct patient, procedure, and site was verified   Indications:  Pain Location:  Knee Site:  L knee Prep: patient was prepped and draped in usual sterile fashion   Needle Size:  25 G Needle Length:  1.5 inches Approach:  Anterolateral Ultrasound Guidance: No   Fluoroscopic Guidance: No   Arthrogram: No   Medications:  40 mg methylPREDNISolone acetate 40 MG/ML; 4 mL bupivacaine 0.25 % Aspiration Attempted: No   Patient tolerance:  Patient tolerated the procedure well with no immediate complications  bandaid applied      Clinical Data: No  additional findings.   Subjective: Chief Complaint  Patient presents with  . Lower Back - Follow-up    HPI  Review of Systems   Objective: Vital Signs: BP 133/74 (BP Location: Left Arm, Patient Position: Sitting)   Pulse 85   Ht 5\' 4"  (1.626 m)   Wt 159 lb (72.1 kg)   BMI 27.29 kg/m   Physical Exam  Ortho Exam  Specialty Comments:  No specialty comments available.  Imaging: No results found.   PMFS History: Patient Active Problem List   Diagnosis Date Noted  . Degenerative disc disease, lumbar 08/01/2015    Priority: High    Class: Chronic  . Spondylolisthesis of lumbar region 08/01/2015    Priority: High    Class: Chronic  . COPD (chronic obstructive pulmonary disease) ([place]) 04/21/2017  . Lower back pain 03/03/2017  . CKD (chronic kidney disease) stage 3, GFR 30-59 ml/min 08/07/2016  . GERD (gastroesophageal reflux disease) 08/07/2016  . Prediabetes 08/07/2016  . Chronic respiratory failure ([place]) 07/29/2016  . Hypersomnia 07/29/2016  . Arthritis of carpometacarpal [facility]) joint of left thumb 07/09/2016  . Lump of axilla 05/02/2016  . Pneumothorax on left 01/31/2016  . Chronic, continuous use of opioids 09/06/2015  . Urinary, incontinence, stress female 05/06/2014  . Constipation 05/05/2014  .  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 HTN (hypertension) 05/19/2013  . HLD (hyperlipidemia) 05/19/2013  . Depression 05/19/2013  . Hypothyroidism 05/19/2013  . Tobacco abuse   . Osteoarthritis of both knees 11/18/2011  . RLS (restless legs syndrome) 11/18/2011   Past Medical History:  Diagnosis Date  . Active smoker   . Anxiety   . Calcifying tendinitis of shoulder   . Chronic back pain   . Chronic pain syndrome   . COPD (chronic obstructive pulmonary disease) ([place])   . Dysthymic disorder   . Emphysema lung ([place])   . GERD (gastroesophageal reflux disease)   . Headache    "weekly maybe" (01/31/2016)  . Heart murmur    for years, nothing to be concerned about  .  Herpes genitalia   . History of blood transfusion 1980   related to "back surgery"  . Hyperlipidemia   . Hypertension   . Hypothyroidism   . Lumbago   . Osteoarthrosis, unspecified whether generalized or localized, lower leg   . Pain in joint, upper arm   . Pneumothorax, left 01/31/2016   S/P Left posterior subcostal pain injection on 01/30/2016  . PONV (postoperative nausea and vomiting)    gets nauseous  with longer surgery. Difficuty voiding after surgery  . Postlaminectomy syndrome, thoracic region   . Primary localized osteoarthrosis, lower leg   . Restless leg syndrome   . Sleep apnea    s/p surgery- last sleep study 2011- doesnt use oxygen or machine at night as instructed,.   12/2014- Dr Halford Chessman  reports it is negative.  . Thyroid disease     Family History  Problem Relation Age of Onset  . Kidney disease Mother   . Heart disease Father   . Anuerysm Brother 29       brain  . Heart disease Brother   . Heart disease Sister 41       s/p CABG  . Hypertension Sister   . Colon cancer Neg Hx     Past Surgical History:  Procedure Laterality Date  . APPENDECTOMY    . BACK SURGERY     18 back surgeries (2 thoracic & 16 lumbar) (01/31/2016)  . DILATION AND CURETTAGE OF UTERUS    . HAMMER TOE SURGERY    . JOINT REPLACEMENT    . KNEE ARTHROSCOPY Right   . LAPAROSCOPIC CHOLECYSTECTOMY    . LUMBAR FUSION N/A 08/01/2015   Procedure: Right sided L1-2 and L2-3 transforaminal lumbar interbody fusion with cages, Extension of posterior fusion T12 to L3, Replaced pedicle screws bilaterally L1-L2 , Replaced left sided pedicle screws L-3. Instrumentation T12 to L3 using local bone graft, Vivigen allograft and cancellous chips;  Surgeon: [name], MD;  Location: [place];  Service: Orthopedics;  Laterality: N/A;  . LUMBAR LAMINECTOMY/DECOMPRESSION MICRODISCECTOMY N/A 01/28/2014   Procedure: Minimally Invasive Right  L1-2 Microdiscectomy;  Surgeon: [name], MD;  Location: [place];  Service:  Orthopedics;  Laterality: N/A;  . TOTAL HIP ARTHROPLASTY Right   . TOTAL KNEE ARTHROPLASTY  05/29/2012   Procedure: TOTAL KNEE ARTHROPLASTY;  Surgeon: Mcarthur Rossetti, MD;  Location: [facility];  Service: Orthopedics;  Laterality: Right;  Right Total Knee Arthroplasty  . TUBAL LIGATION    . UVULOPALATOPHARYNGOPLASTY    . VAGINAL HYSTERECTOMY     Social History   Occupational History  . disability     back surgeries   Social History Main Topics  . Smoking status: Current Every Day Smoker    Packs/day:  1.25    Years: 45.00    Types: Cigarettes  . Smokeless tobacco: Never Used     Comment: form given 12/25/16  . Alcohol use No  . Drug use: No     Comment: 01/31/2016 "none since ~ 1980"  . Sexual activity: No

## 2017-05-12 NOTE — Patient Instructions (Signed)
  Knee is suffering from osteoarthritis, only real proven treatments are Weight loss, NSIADs like naprosyn and exercise. Well padded shoes help. Ice the knee 2-3 times a day 15-20 mins at a time. MRI of the left hip is ordered to assess for stress fracture. Dr. Estanislado Pandy consult to consider for sacroplasty is resubmitted.

## 2017-05-14 ENCOUNTER — Telehealth: Payer: Self-pay | Admitting: *Deleted

## 2017-05-14 NOTE — Telephone Encounter (Signed)
Return  Ms. Fonte call, she was looking for an increase in her Seroquel, she was instructed to purchase melatonin and to try sleepy-time tea, she verbalizes understanding. Also instructed to keep a headache journal she verbalizes understanding.

## 2017-05-14 NOTE — Telephone Encounter (Signed)
Patient called and left a message with the hope of sharing information before her upcoming visit with Zella Ball on Sept 14th, 2018.  She called today knowing that Dr. Naaman Plummer would be here as well. She states that her sleep medication is not helping like it used to.  She also states that she has been having headaches for the past month, perhaps 3-4 a day.  She is hoping that this information could be shared amongst Dr. Naaman Plummer and Zella Ball and if there is any help that could be provided for the sleep deficiency and the recurring headaches.

## 2017-05-14 NOTE — Telephone Encounter (Signed)
Sounds quite appropriate. Thanks!

## 2017-05-15 ENCOUNTER — Encounter: Payer: Self-pay | Admitting: Registered Nurse

## 2017-05-15 ENCOUNTER — Encounter: Payer: Medicare HMO | Attending: Physical Medicine & Rehabilitation | Admitting: Registered Nurse

## 2017-05-15 VITALS — BP 131/86 | HR 89

## 2017-05-15 DIAGNOSIS — M791 Myalgia: Secondary | ICD-10-CM

## 2017-05-15 DIAGNOSIS — Z5181 Encounter for therapeutic drug level monitoring: Secondary | ICD-10-CM

## 2017-05-15 DIAGNOSIS — M25562 Pain in left knee: Secondary | ICD-10-CM | POA: Diagnosis not present

## 2017-05-15 DIAGNOSIS — M2170 Unequal limb length (acquired), unspecified site: Secondary | ICD-10-CM | POA: Diagnosis not present

## 2017-05-15 DIAGNOSIS — E039 Hypothyroidism, unspecified: Secondary | ICD-10-CM | POA: Diagnosis not present

## 2017-05-15 DIAGNOSIS — G9619 Other disorders of meninges, not elsewhere classified: Secondary | ICD-10-CM | POA: Diagnosis not present

## 2017-05-15 DIAGNOSIS — G2581 Restless legs syndrome: Secondary | ICD-10-CM | POA: Diagnosis not present

## 2017-05-15 DIAGNOSIS — M170 Bilateral primary osteoarthritis of knee: Secondary | ICD-10-CM | POA: Insufficient documentation

## 2017-05-15 DIAGNOSIS — E785 Hyperlipidemia, unspecified: Secondary | ICD-10-CM | POA: Diagnosis not present

## 2017-05-15 DIAGNOSIS — K219 Gastro-esophageal reflux disease without esophagitis: Secondary | ICD-10-CM | POA: Insufficient documentation

## 2017-05-15 DIAGNOSIS — I10 Essential (primary) hypertension: Secondary | ICD-10-CM | POA: Diagnosis not present

## 2017-05-15 DIAGNOSIS — G4709 Other insomnia: Secondary | ICD-10-CM

## 2017-05-15 DIAGNOSIS — M62830 Muscle spasm of back: Secondary | ICD-10-CM

## 2017-05-15 DIAGNOSIS — R519 Headache, unspecified: Secondary | ICD-10-CM

## 2017-05-15 DIAGNOSIS — M5126 Other intervertebral disc displacement, lumbar region: Secondary | ICD-10-CM | POA: Insufficient documentation

## 2017-05-15 DIAGNOSIS — M4316 Spondylolisthesis, lumbar region: Secondary | ICD-10-CM

## 2017-05-15 DIAGNOSIS — G894 Chronic pain syndrome: Secondary | ICD-10-CM | POA: Insufficient documentation

## 2017-05-15 DIAGNOSIS — M7918 Myalgia, other site: Secondary | ICD-10-CM

## 2017-05-15 DIAGNOSIS — Z9889 Other specified postprocedural states: Secondary | ICD-10-CM | POA: Insufficient documentation

## 2017-05-15 DIAGNOSIS — Z79899 Other long term (current) drug therapy: Secondary | ICD-10-CM | POA: Insufficient documentation

## 2017-05-15 DIAGNOSIS — J449 Chronic obstructive pulmonary disease, unspecified: Secondary | ICD-10-CM | POA: Diagnosis not present

## 2017-05-15 DIAGNOSIS — M5136 Other intervertebral disc degeneration, lumbar region: Secondary | ICD-10-CM

## 2017-05-15 DIAGNOSIS — Z9981 Dependence on supplemental oxygen: Secondary | ICD-10-CM | POA: Insufficient documentation

## 2017-05-15 DIAGNOSIS — Z720 Tobacco use: Secondary | ICD-10-CM | POA: Diagnosis not present

## 2017-05-15 DIAGNOSIS — R51 Headache: Secondary | ICD-10-CM

## 2017-05-15 DIAGNOSIS — M961 Postlaminectomy syndrome, not elsewhere classified: Secondary | ICD-10-CM | POA: Diagnosis not present

## 2017-05-15 DIAGNOSIS — E079 Disorder of thyroid, unspecified: Secondary | ICD-10-CM | POA: Insufficient documentation

## 2017-05-15 DIAGNOSIS — F411 Generalized anxiety disorder: Secondary | ICD-10-CM

## 2017-05-15 MED ORDER — FENTANYL 50 MCG/HR TD PT72: 50.0000 ug | MEDICATED_PATCH | TRANSDERMAL | 0 refills | Status: DC

## 2017-05-15 MED ORDER — HYDROCODONE-ACETAMINOPHEN 10-325 MG PO TABS: 1.0000 | ORAL_TABLET | Freq: Four times a day (QID) | ORAL | 0 refills | Status: DC | PRN

## 2017-05-15 NOTE — Progress Notes (Signed)
Subjective:    Patient ID: Jill Phillips, female    DOB: 08/04/1956, 60 y.o.   MRN: [id number]  HPI: [name] is a 61 year old female who returns for follow up appointmentfor chronic pain and medication refill. She states her pain is located in her lower back, left hip and left knee. Also states her headache has increased in frequency. I spoke with Jill Phillips yesterday on (05/14/17) she was instructed to keep a headache journal she verbalizes understanding. She rates her pain 6.Her current exercise regime is walking.   Jill Phillips has decreased her smoking to 1/2 pack a day and using nicotine patches she states.   Jill Phillips states  on Monday 05/12/2017 she received a cortisone injection by  Dr. [name]. Dr. [name] notes were reviewed. He ordered a MRI of her  left hip to assess for stress fracture.   Daughter in the room.  Last UDS was on 01/23/2017, it was consistent.  Pain Inventory Average Pain 5 Pain Right Now 6 My pain is dull and aching  In the last 24 hours, has pain interfered with the following? General activity 7 Relation with others 4 Enjoyment of life 4 What TIME of day is your pain at its worst? daytime Sleep (in general) Poor  Pain is worse with: walking, standing and some activites Pain improves with: rest, heat/ice and medication Relief from Meds: 9  Mobility walk without assistance ability to climb steps?  no do you drive?  yes transfers alone  Function Do you have any goals in this area?  no  Neuro/Psych numbness spasms  Prior Studies Any changes since last visit?  no  Physicians involved in your care Any changes since last visit?  no   Family History  Problem Relation Age of Onset  . Kidney disease Mother   . Heart disease Father   . Anuerysm Brother 29       brain  . Heart disease Brother   . Heart disease Sister 86       s/p CABG  . Hypertension Sister   . Colon cancer Neg Hx    Social History   Social History  .  Marital status: Divorced    Spouse name: n/a  . Number of children: 2  . Years of education: 12+   Occupational History  . disability     back surgeries   Social History Main Topics  . Smoking status: Current Every Day Smoker    Packs/day: 1.25    Years: 45.00    Types: Cigarettes  . Smokeless tobacco: Never Used     Comment: form given 12/25/16  . Alcohol use No  . Drug use: No     Comment: 01/31/2016 "none since ~ 1980"  . Sexual activity: No   Other Topics Concern  . Not on file   Social History Narrative   Lives alone.  One daughter is local, but is getting ready to move to [place], where her children live with their father.  The other daughter lives near [place], [place].   Past Surgical History:  Procedure Laterality Date  . APPENDECTOMY    . BACK SURGERY     18 back surgeries (2 thoracic & 16 lumbar) (01/31/2016)  . DILATION AND CURETTAGE OF UTERUS    . HAMMER TOE SURGERY    . JOINT REPLACEMENT    . KNEE ARTHROSCOPY Right   . LAPAROSCOPIC CHOLECYSTECTOMY    . LUMBAR FUSION N/A 08/01/2015   Procedure:  Right sided L1-2 and L2-3 transforaminal lumbar interbody fusion with cages, Extension of posterior fusion T12 to L3, Replaced pedicle screws bilaterally L1-L2 , Replaced left sided pedicle screws L-3. Instrumentation T12 to L3 using local bone graft, Vivigen allograft and cancellous chips;  Surgeon: [name], MD;  Location: [facility];  Service: Orthopedics;  Laterality: N/A;  . LUMBAR LAMINECTOMY/DECOMPRESSION MICRODISCECTOMY N/A 01/28/2014   Procedure: Minimally Invasive Right  L1-2 Microdiscectomy;  Surgeon: [name], MD;  Location: [facility];  Service: Orthopedics;  Laterality: N/A;  . TOTAL HIP ARTHROPLASTY Right   . TOTAL KNEE ARTHROPLASTY  05/29/2012   Procedure: TOTAL KNEE ARTHROPLASTY;  Surgeon: Mcarthur Rossetti, MD;  [facility];  Service: Orthopedics;  Laterality: Right;  Right Total Knee Arthroplasty  . TUBAL LIGATION    . UVULOPALATOPHARYNGOPLASTY      . VAGINAL HYSTERECTOMY     Past Medical History:  Diagnosis Date  . Active smoker   . Anxiety   . Calcifying tendinitis of shoulder   . Chronic back pain   . Chronic pain syndrome   . COPD (chronic obstructive pulmonary disease) ([place])   . Dysthymic disorder   . Emphysema lung ([place])   . GERD (gastroesophageal reflux disease)   . Headache    "weekly maybe" (01/31/2016)  . Heart murmur    for years, nothing to be concerned about  . Herpes genitalia   . History of blood transfusion 1980   related to "back surgery"  . Hyperlipidemia   . Hypertension   . Hypothyroidism   . Lumbago   . Osteoarthrosis, unspecified whether generalized or localized, lower leg   . Pain in joint, upper arm   . Pneumothorax, left 01/31/2016   S/P Left posterior subcostal pain injection on 01/30/2016  . PONV (postoperative nausea and vomiting)    gets nauseous  with longer surgery. Difficuty voiding after surgery  . Postlaminectomy syndrome, thoracic region   . Primary localized osteoarthrosis, lower leg   . Restless leg syndrome   . Sleep apnea    s/p surgery- last sleep study 2011- doesnt use oxygen or machine at night as instructed,.   12/2014- Dr Halford Chessman  reports it is negative.  . Thyroid disease    There were no vitals taken for this visit.  Opioid Risk Score:   Fall Risk Score:  `1  Depression screen PHQ 2/9  Depression screen [facility] 2/9 05/15/2016 05/02/2016 12/07/2015 11/15/2015 10/13/2015 09/06/2015 05/22/2015  Decreased Interest 0 0 0 0 0 0 0  Down, Depressed, Hopeless 0 0 0 0 - 0 0  PHQ - 2 Score 0 0 0 0 0 0 0  Altered sleeping - - - - - - -  Tired, decreased energy - - - - - - -  Change in appetite - - - - - - -  Feeling bad or failure about yourself  - - - - - - -  Trouble concentrating - - - - - - -  Moving slowly or fidgety/restless - - - - - - -  Suicidal thoughts - - - - - - -  PHQ-9 Score - - - - - - -  Some recent data might be hidden      Review of Systems  All other systems  reviewed and are negative.      Objective:   Physical Exam  Constitutional: She is oriented to person, place, and time. She appears well-developed and well-nourished.  HENT:  Head: Normocephalic and atraumatic.  Neck: Normal range of motion. Neck supple.  Cardiovascular: Normal rate and regular rhythm.   Pulmonary/Chest: Effort normal and breath sounds normal.  Musculoskeletal:  Normal Muscle Bulk and Muscle Testing Reveals: Upper Extremities: Full ROM and Muscle Strength 5/5 Thoracic Paraspinal Tenderness: T-10- T-12 Lumbar Paraspinal Tenderness: L-4-L-5 Left Greater Trochanter tenderness Lower Extremities: Full ROM and Muscle Strength 5/5 Left Lower Extremity Flexion Produces Pain into Patella Arises from Table with ease Narrow Based gait   Neurological: She is alert and oriented to person, place, and time.  Skin: Skin is warm and dry.  Psychiatric: She has a normal mood and affect.  Nursing note and vitals reviewed.         Assessment & Plan:  1. Chronic lumbar spine pain/post-lami syndrome: 05/15/2017 Refilled: Fentanyl 50 MCG one patch every three days #10 and Hydrocodone 10/325 mg one tablet every 6 hours as needed for pain #120. We will continue the opioid monitoring program, this consists of regular clinic visits, examinations, urine drug screen, pill counts as well as use of [place] Controlled Substance Reporting System. 2. Chronic Bilateral Thoracic Pain: No complaints today :Continue current medication regime and Continue to Monitor.05/15/17 3. Osteoarthritis of left knee: S/P Cortisone Injection on 05/12/2017 by Dr. [name]. Continue with Heat, exercise and voltaren gel. 05/15/2017 4. Rotator cuff syndrome/subacromial bursitis: No complaints voiced today.  09/ 13/2018 5. Restless legs syndrome: Continue Requip.Continue to monitor. 05/15/2017 6. Tobacco Abuse: Continue Smoking Cessation: PCP prescribed Nicotine Patches :  05/15/2017 7. Muscle Spasm:  Continue Soma/ Tizanidine. 05/15/2017 9. Insomnia: Continue Seroquel. 05/15/2017 10. Neuropathic Pain: Continue Gabapentin.05/15/2017 11.Anxiety: Continue Klonopin. 05/15/2017  20 minutes of face to face patient care time was spent during this visit. All questions were encouraged and answered.  F/u in 1 month

## 2017-05-16 ENCOUNTER — Encounter: Payer: Medicare HMO | Admitting: Registered Nurse

## 2017-05-16 ENCOUNTER — Other Ambulatory Visit: Payer: Self-pay | Admitting: Internal Medicine

## 2017-05-16 DIAGNOSIS — E038 Other specified hypothyroidism: Secondary | ICD-10-CM

## 2017-05-19 ENCOUNTER — Ambulatory Visit (INDEPENDENT_AMBULATORY_CARE_PROVIDER_SITE_OTHER): Payer: Medicare HMO | Admitting: Specialist

## 2017-05-22 ENCOUNTER — Ambulatory Visit
Admission: RE | Admit: 2017-05-22 | Discharge: 2017-05-22 | Disposition: A | Discharge status: Home / Self Care | Payer: Medicare HMO | Source: Ambulatory Visit | Attending: Specialist | Admitting: Specialist

## 2017-05-22 DIAGNOSIS — M1612 Unilateral primary osteoarthritis, left hip: Secondary | ICD-10-CM | POA: Diagnosis not present

## 2017-05-22 DIAGNOSIS — M25552 Pain in left hip: Secondary | ICD-10-CM

## 2017-05-22 DIAGNOSIS — M25562 Pain in left knee: Secondary | ICD-10-CM

## 2017-05-26 ENCOUNTER — Telehealth (INDEPENDENT_AMBULATORY_CARE_PROVIDER_SITE_OTHER): Payer: Self-pay | Admitting: Radiology

## 2017-05-26 NOTE — Telephone Encounter (Signed)
Patients daughter is needing a new letter for social services so she can get her food stamps.  Ramla states that the letter needs to state " Thom Chimes is the caregiver for Blakeley Scheier." And the letter can be faxed to Social Services @ [phone]. Patients call back number is [phone]

## 2017-05-29 ENCOUNTER — Other Ambulatory Visit (HOSPITAL_COMMUNITY): Payer: Self-pay | Admitting: Interventional Radiology

## 2017-05-29 ENCOUNTER — Telehealth (INDEPENDENT_AMBULATORY_CARE_PROVIDER_SITE_OTHER): Payer: Self-pay | Admitting: Specialist

## 2017-05-29 DIAGNOSIS — S3210XA Unspecified fracture of sacrum, initial encounter for closed fracture: Secondary | ICD-10-CM

## 2017-05-29 NOTE — Telephone Encounter (Signed)
Patient called asking about a letter stating that her daughter is her caretaker in order for them to get food stamps. CB # [phone]

## 2017-05-29 NOTE — Telephone Encounter (Signed)
Patient called asking about a letter stating that her daughter is her caretaker in order for them to get food stamps. CB # [phone]  Fax # [phone]

## 2017-06-02 ENCOUNTER — Encounter (INDEPENDENT_AMBULATORY_CARE_PROVIDER_SITE_OTHER): Payer: Self-pay | Admitting: Specialist

## 2017-06-02 ENCOUNTER — Ambulatory Visit (INDEPENDENT_AMBULATORY_CARE_PROVIDER_SITE_OTHER): Payer: Medicare HMO | Admitting: Specialist

## 2017-06-02 VITALS — BP 121/76 | HR 99 | Ht 64.0 in | Wt 159.0 lb

## 2017-06-02 DIAGNOSIS — M546 Pain in thoracic spine: Secondary | ICD-10-CM

## 2017-06-02 DIAGNOSIS — R200 Anesthesia of skin: Secondary | ICD-10-CM

## 2017-06-02 DIAGNOSIS — R0781 Pleurodynia: Secondary | ICD-10-CM | POA: Diagnosis not present

## 2017-06-02 NOTE — Progress Notes (Signed)
Office Visit Note   Patient: Jill Phillips           Date of Birth: 1955/11/30           MRN: [id number] Visit Date: 06/02/2017              Requested by: [name], MD [place], [place] [address] PCP: [name], MD   Assessment & Plan: Visit Diagnoses:  1. Rib pain on left side   2. Left-sided thoracic back pain, unspecified chronicity   3. Numbness of right foot     Plan: Knee is suffering from osteoarthritis, only real proven treatments are Weight loss, NSIADs like naprosyn and exercise. Well padded shoes help. Ice the knee 2-3 times a day 15-20 mins at a time. Low impact exercises, use of stationary bike or pool walking exercises. Use can in the right hand for left hip pain. Schedule an appt to see a neurologist to have nerve tests of the feet.  Follow-Up Instructions: Return in about 6 weeks (around 07/14/2017).   Orders:  No orders of the defined types were placed in this encounter.  No orders of the defined types were placed in this encounter.     Procedures: No procedures performed   Clinical Data: No additional findings.   Subjective: Chief Complaint  Patient presents with  . Left Knee - Follow-up    Had injection in Left knee on 05/12/17    61 year old female with pain into the left lower ribs and into the left inguinal and lower abdomen pain. She reports that she needs a letter for her care taker her daughter and this was to allow Her daughter to get food stamps and have access to some resourses that may assist with her ADLs.    Review of Systems   Objective: Vital Signs: BP 121/76 (BP Location: Left Arm, Patient Position: Sitting)   Pulse 99   Ht 5\' 4"  (1.626 m)   Wt 159 lb (72.1 kg)   BMI 27.29 kg/m   Physical Exam  Ortho Exam  Specialty Comments:  No specialty comments available.  Imaging: No results found.   PMFS History: Patient Active Problem List   Diagnosis Date Noted  . Degenerative disc disease,  lumbar 08/01/2015    Priority: High    Class: Chronic  . Spondylolisthesis of lumbar region 08/01/2015    Priority: High    Class: Chronic  . COPD (chronic obstructive pulmonary disease) ([place]) 04/21/2017  . Lower back pain 03/03/2017  . CKD (chronic kidney disease) stage 3, GFR 30-59 ml/min (H[facility]) 08/07/2016  . GERD (gastroesophageal reflux disease) 08/07/2016  . Prediabetes 08/07/2016  . Chronic respiratory failure ([place]) 07/29/2016  . Hypersomnia 07/29/2016  . Arthritis of carpometacarpal [facility]) joint of left thumb 07/09/2016  . Lump of axilla 05/02/2016  . Pneumothorax on left 01/31/2016  . Chronic, continuous use of opioids 09/06/2015  . Urinary, incontinence, stress female 05/06/2014  . Constipation 05/05/2014  . 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 HTN (hypertension) 05/19/2013  . HLD (hyperlipidemia) 05/19/2013  . Depression 05/19/2013  . Hypothyroidism 05/19/2013  . Tobacco abuse   . Osteoarthritis of both knees 11/18/2011  . RLS (restless legs syndrome) 11/18/2011   Past Medical History:  Diagnosis Date  . Active smoker   . Anxiety   . Calcifying tendinitis of shoulder   . Chronic back pain   . Chronic pain syndrome   . COPD (chronic obstructive pulmonary disease) ([place])   .  Dysthymic disorder   . Emphysema lung ([place])   . GERD (gastroesophageal reflux disease)   . Headache    "weekly maybe" (01/31/2016)  . Heart murmur    for years, nothing to be concerned about  . Herpes genitalia   . History of blood transfusion 1980   related to "back surgery"  . Hyperlipidemia   . Hypertension   . Hypothyroidism   . Lumbago   . Osteoarthrosis, unspecified whether generalized or localized, lower leg   . Pain in joint, upper arm   . Pneumothorax, left 01/31/2016   S/P Left posterior subcostal pain injection on 01/30/2016  . PONV (postoperative nausea and vomiting)    gets nauseous  with longer surgery. Difficuty voiding after surgery  . Postlaminectomy syndrome, thoracic region   .  Primary localized osteoarthrosis, lower leg   . Restless leg syndrome   . Sleep apnea    s/p surgery- last sleep study 2011- doesnt use oxygen or machine at night as instructed,.   12/2014- Dr Halford Chessman  reports it is negative.  . Thyroid disease     Family History  Problem Relation Age of Onset  . Kidney disease Mother   . Heart disease Father   . Anuerysm Brother 29       brain  . Heart disease Brother   . Heart disease Sister 99       s/p CABG  . Hypertension Sister   . Colon cancer Neg Hx     Past Surgical History:  Procedure Laterality Date  . APPENDECTOMY    . BACK SURGERY     18 back surgeries (2 thoracic & 16 lumbar) (01/31/2016)  . DILATION AND CURETTAGE OF UTERUS    . HAMMER TOE SURGERY    . JOINT REPLACEMENT    . KNEE ARTHROSCOPY Right   . LAPAROSCOPIC CHOLECYSTECTOMY    . LUMBAR FUSION N/A 08/01/2015   Procedure: Right sided L1-2 and L2-3 transforaminal lumbar interbody fusion with cages, Extension of posterior fusion T12 to L3, Replaced pedicle screws bilaterally L1-L2 , Replaced left sided pedicle screws L-3. Instrumentation T12 to L3 using local bone graft, Vivigen allograft and cancellous chips;  Surgeon: [name], MD;  Location: [place];  Service: Orthopedics;  Laterality: N/A;  . LUMBAR LAMINECTOMY/DECOMPRESSION MICRODISCECTOMY N/A 01/28/2014   Procedure: Minimally Invasive Right  L1-2 Microdiscectomy;  Surgeon: [name], MD;  Location: [place];  Service: Orthopedics;  Laterality: N/A;  . TOTAL HIP ARTHROPLASTY Right   . TOTAL KNEE ARTHROPLASTY  05/29/2012   Procedure: TOTAL KNEE ARTHROPLASTY;  Surgeon: Mcarthur Rossetti, MD;  [facility];  Service: Orthopedics;  Laterality: Right;  Right Total Knee Arthroplasty  . TUBAL LIGATION    . UVULOPALATOPHARYNGOPLASTY    . VAGINAL HYSTERECTOMY     Social History   Occupational History  . disability     back surgeries   Social History Main Topics  . Smoking status: Current Every Day Smoker    Packs/day:  1.25    Years: 45.00    Types: Cigarettes  . Smokeless tobacco: Never Used     Comment: form given 12/25/16  . Alcohol use No  . Drug use: No     Comment: 01/31/2016 "none since ~ 1980"  . Sexual activity: No

## 2017-06-02 NOTE — Patient Instructions (Addendum)
  Knee is suffering from osteoarthritis, only real proven treatments are Weight loss, NSIADs like naprosyn and exercise. Well padded shoes help. Ice the knee 2-3 times a day 15-20 mins at a time. Low impact exercises, use of stationary bike or pool walking exercises. Use can in the right hand for left hip pain. Schedule an appt to see a neurologist to have nerve tests of the feet.

## 2017-06-11 ENCOUNTER — Other Ambulatory Visit: Payer: Self-pay | Admitting: Emergency Medicine

## 2017-06-11 MED ORDER — VALACYCLOVIR HCL 500 MG PO TABS: 500.0000 mg | ORAL_TABLET | Freq: Every day | ORAL | 2 refills | Status: DC

## 2017-06-12 ENCOUNTER — Ambulatory Visit (HOSPITAL_COMMUNITY): Admission: RE | Admit: 2017-06-12 | Payer: Medicare HMO | Source: Ambulatory Visit

## 2017-06-13 ENCOUNTER — Telehealth: Payer: Self-pay | Admitting: Registered Nurse

## 2017-06-13 ENCOUNTER — Encounter: Payer: Medicare HMO | Attending: Physical Medicine & Rehabilitation | Admitting: Registered Nurse

## 2017-06-13 ENCOUNTER — Encounter: Payer: Self-pay | Admitting: Registered Nurse

## 2017-06-13 VITALS — BP 121/79 | HR 89

## 2017-06-13 DIAGNOSIS — Z9981 Dependence on supplemental oxygen: Secondary | ICD-10-CM | POA: Diagnosis not present

## 2017-06-13 DIAGNOSIS — M5136 Other intervertebral disc degeneration, lumbar region: Secondary | ICD-10-CM

## 2017-06-13 DIAGNOSIS — M961 Postlaminectomy syndrome, not elsewhere classified: Secondary | ICD-10-CM | POA: Diagnosis not present

## 2017-06-13 DIAGNOSIS — E079 Disorder of thyroid, unspecified: Secondary | ICD-10-CM | POA: Diagnosis not present

## 2017-06-13 DIAGNOSIS — G4709 Other insomnia: Secondary | ICD-10-CM | POA: Diagnosis not present

## 2017-06-13 DIAGNOSIS — M4316 Spondylolisthesis, lumbar region: Secondary | ICD-10-CM

## 2017-06-13 DIAGNOSIS — Z79899 Other long term (current) drug therapy: Secondary | ICD-10-CM

## 2017-06-13 DIAGNOSIS — E785 Hyperlipidemia, unspecified: Secondary | ICD-10-CM | POA: Diagnosis not present

## 2017-06-13 DIAGNOSIS — G2581 Restless legs syndrome: Secondary | ICD-10-CM

## 2017-06-13 DIAGNOSIS — M5126 Other intervertebral disc displacement, lumbar region: Secondary | ICD-10-CM | POA: Diagnosis not present

## 2017-06-13 DIAGNOSIS — G9619 Other disorders of meninges, not elsewhere classified: Secondary | ICD-10-CM | POA: Diagnosis not present

## 2017-06-13 DIAGNOSIS — J449 Chronic obstructive pulmonary disease, unspecified: Secondary | ICD-10-CM | POA: Insufficient documentation

## 2017-06-13 DIAGNOSIS — Z720 Tobacco use: Secondary | ICD-10-CM | POA: Diagnosis not present

## 2017-06-13 DIAGNOSIS — M170 Bilateral primary osteoarthritis of knee: Secondary | ICD-10-CM | POA: Insufficient documentation

## 2017-06-13 DIAGNOSIS — Z5181 Encounter for therapeutic drug level monitoring: Secondary | ICD-10-CM | POA: Diagnosis not present

## 2017-06-13 DIAGNOSIS — K219 Gastro-esophageal reflux disease without esophagitis: Secondary | ICD-10-CM | POA: Insufficient documentation

## 2017-06-13 DIAGNOSIS — G894 Chronic pain syndrome: Secondary | ICD-10-CM

## 2017-06-13 DIAGNOSIS — M62830 Muscle spasm of back: Secondary | ICD-10-CM | POA: Diagnosis not present

## 2017-06-13 DIAGNOSIS — Z9889 Other specified postprocedural states: Secondary | ICD-10-CM | POA: Diagnosis not present

## 2017-06-13 DIAGNOSIS — I10 Essential (primary) hypertension: Secondary | ICD-10-CM | POA: Diagnosis not present

## 2017-06-13 DIAGNOSIS — E039 Hypothyroidism, unspecified: Secondary | ICD-10-CM | POA: Insufficient documentation

## 2017-06-13 DIAGNOSIS — M2170 Unequal limb length (acquired), unspecified site: Secondary | ICD-10-CM | POA: Insufficient documentation

## 2017-06-13 MED ORDER — CLONAZEPAM 2 MG PO TABS: ORAL_TABLET | ORAL | 2 refills | Status: DC

## 2017-06-13 MED ORDER — ROPINIROLE HCL 0.5 MG PO TABS: ORAL_TABLET | ORAL | 2 refills | Status: DC

## 2017-06-13 MED ORDER — HYDROCODONE-ACETAMINOPHEN 10-325 MG PO TABS: 1.0000 | ORAL_TABLET | Freq: Four times a day (QID) | ORAL | 0 refills | Status: DC | PRN

## 2017-06-13 MED ORDER — FENTANYL 50 MCG/HR TD PT72: 50.0000 ug | MEDICATED_PATCH | TRANSDERMAL | 0 refills | Status: DC

## 2017-06-13 MED ORDER — QUETIAPINE FUMARATE 100 MG PO TABS: 100.0000 mg | ORAL_TABLET | Freq: Every day | ORAL | 2 refills | Status: DC

## 2017-06-13 NOTE — Telephone Encounter (Signed)
On 06/13/2017 the  Jill Phillips was reviewed no conflict was seen on the [place] with multiple prescribers. Ms. Heindl  has a signed narcotic contract with our office. If there were any discrepancies this would have been reported to her physician.

## 2017-06-13 NOTE — Progress Notes (Signed)
Subjective:    Patient ID: Jill Phillips, female    DOB: July 29, 1956, 61 y.o.   MRN: [id number]  HPI: Jill Phillips is a 61 year old female who returns for follow up appointmentfor chronic pain and medication refill. She states she has left rib pain also  lower back pain and left lower extremity pain.  Also states she has rectal pain. Jill Phillips states she's always in pain, also complaining about her restless leg pain. Her medications were reviewed. She has been on this current regimen for years we discussed various options, she will think about it this month and discuss with Dr. Naaman Plummer next month she states. Instructed to being in her [employer], all questions answered ,she verbalizes understanding.   Jill Phillips Morphine equivalent is  160.00 MME.  She's also prescribed Klonopin. We have discussed the black box warning of using opioids and benzodiazepines. I highlighted the dangers of using these drugs together and discussed the adverse events including respiratory suppression, overdose, cognitive impairment and importance of  compliance with current regimen. She verbalizes understanding, we will continue to monitor and adjust as indicated.    She rates her pain 5. Her current exercise regime is walking.   Daughter in the room.  Last UDS was on 01/23/2017, it was consistent.  Pain Inventory Average Pain 5 Pain Right Now 5 My pain is dull, stabbing and aching  In the last 24 hours, has pain interfered with the following? General activity 9 Relation with others 9 Enjoyment of life 9 What TIME of day is your pain at its worst? all Sleep (in general) Poor  Pain is worse with: . Pain improves with: . Relief from Meds: 5  Mobility walk without assistance ability to climb steps?  no do you drive?  yes transfers alone  Function Do you have any goals in this area?  no  Neuro/Psych numbness spasms  Prior Studies Any changes since last visit?  no  Physicians  involved in your care Any changes since last visit?  yes   Family History  Problem Relation Age of Onset  . Kidney disease Mother   . Heart disease Father   . Anuerysm Brother 29       brain  . Heart disease Brother   . Heart disease Sister 18       s/p CABG  . Hypertension Sister   . Colon cancer Neg Hx    Social History   Social History  . Marital status: Divorced    Spouse name: n/a  . Number of children: 2  . Years of education: 12+   Occupational History  . disability     back surgeries   Social History Main Topics  . Smoking status: Current Every Day Smoker    Packs/day: 1.25    Years: 45.00    Types: Cigarettes  . Smokeless tobacco: Never Used     Comment: form given 12/25/16  . Alcohol use No  . Drug use: No     Comment: 01/31/2016 "none since ~ 1980"  . Sexual activity: No   Other Topics Concern  . Not on file   Social History Narrative   Lives alone.  One daughter is local, but is getting ready to move to [place], where her children live with their father.  The other daughter lives near [place], [place].   Past Surgical History:  Procedure Laterality Date  . APPENDECTOMY    . BACK SURGERY  18 back surgeries (2 thoracic & 16 lumbar) (01/31/2016)  . DILATION AND CURETTAGE OF UTERUS    . HAMMER TOE SURGERY    . JOINT REPLACEMENT    . KNEE ARTHROSCOPY Right   . LAPAROSCOPIC CHOLECYSTECTOMY    . LUMBAR FUSION N/A 08/01/2015   Procedure: Right sided L1-2 and L2-3 transforaminal lumbar interbody fusion with cages, Extension of posterior fusion T12 to L3, Replaced pedicle screws bilaterally L1-L2 , Replaced left sided pedicle screws L-3. Instrumentation T12 to L3 using local bone graft, Vivigen allograft and cancellous chips;  Surgeon: [name], MD;  Location: [place];  Service: Orthopedics;  Laterality: N/A;  . LUMBAR LAMINECTOMY/DECOMPRESSION MICRODISCECTOMY N/A 01/28/2014   Procedure: Minimally Invasive Right  L1-2 Microdiscectomy;  Surgeon: [name], MD;  Location: [place];  Service: Orthopedics;  Laterality: N/A;  . TOTAL HIP ARTHROPLASTY Right   . TOTAL KNEE ARTHROPLASTY  05/29/2012   Procedure: TOTAL KNEE ARTHROPLASTY;  Surgeon: Mcarthur Rossetti, MD;  [facility];  Service: Orthopedics;  Laterality: Right;  Right Total Knee Arthroplasty  . TUBAL LIGATION    . UVULOPALATOPHARYNGOPLASTY    . VAGINAL HYSTERECTOMY     Past Medical History:  Diagnosis Date  . Active smoker   . Anxiety   . Calcifying tendinitis of shoulder   . Chronic back pain   . Chronic pain syndrome   . COPD (chronic obstructive pulmonary disease) ([place])   . Dysthymic disorder   . Emphysema lung ([place])   . GERD (gastroesophageal reflux disease)   . Headache    "weekly maybe" (01/31/2016)  . Heart murmur    for years, nothing to be concerned about  . Herpes genitalia   . History of blood transfusion 1980   related to "back surgery"  . Hyperlipidemia   . Hypertension   . Hypothyroidism   . Lumbago   . Osteoarthrosis, unspecified whether generalized or localized, lower leg   . Pain in joint, upper arm   . Pneumothorax, left 01/31/2016   S/P Left posterior subcostal pain injection on 01/30/2016  . PONV (postoperative nausea and vomiting)    gets nauseous  with longer surgery. Difficuty voiding after surgery  . Postlaminectomy syndrome, thoracic region   . Primary localized osteoarthrosis, lower leg   . Restless leg syndrome   . Sleep apnea    s/p surgery- last sleep study 2011- doesnt use oxygen or machine at night as instructed,.   12/2014- Dr Halford Chessman  reports it is negative.  . Thyroid disease    BP 121/79   Pulse 89   SpO2 93%   Opioid Risk Score:  6 Fall Risk Score:  `1  Depression screen PHQ 2/9  Depression screen [facility] 2/9 06/13/2017 05/15/2016 05/02/2016 12/07/2015 11/15/2015 10/13/2015 09/06/2015  Decreased Interest 0 0 0 0 0 0 0  Down, Depressed, Hopeless 0 0 0 0 0 - 0  PHQ - 2 Score 0 0 0 0 0 0 0  Altered sleeping - - - - - - -  Tired,  decreased energy - - - - - - -  Change in appetite - - - - - - -  Feeling bad or failure about yourself  - - - - - - -  Trouble concentrating - - - - - - -  Moving slowly or fidgety/restless - - - - - - -  Suicidal thoughts - - - - - - -  PHQ-9 Score - - - - - - -  Some  recent data might be hidden      Review of Systems  Constitutional: Positive for unexpected weight change.  Respiratory: Positive for shortness of breath.   All other systems reviewed and are negative.      Objective:   Physical Exam  Constitutional: She is oriented to person, place, and time. She appears well-developed and well-nourished.  HENT:  Head: Normocephalic and atraumatic.  Neck: Normal range of motion. Neck supple.  Cardiovascular: Normal rate and regular rhythm.   Pulmonary/Chest: Effort normal and breath sounds normal.  Musculoskeletal:  Normal Muscle Bulk and Muscle Testing Reveals: Upper Extremities: Full ROM and Muscle Strength 5/5 Thoracic Paraspinal Tenderness: T-7-T-9 Lower Extremities: Full ROM and Muscle Strength 5/5 Arises from Table with ease Narrow Based gait   Neurological: She is alert and oriented to person, place, and time.  Skin: Skin is warm and dry.  Psychiatric: She has a normal mood and affect.  Nursing note and vitals reviewed.         Assessment & Plan:  1. Chronic lumbar spine pain/post-lami syndrome: 06/13/2017 Refilled: Fentanyl 50 MCG one patch every three days #10 and Hydrocodone 10/325 mg one tablet every 6 hours as needed for pain #120. We will continue the opioid monitoring program, this consists of regular clinic visits, examinations, urine drug screen, pill counts as well as use of [place] Controlled Substance Reporting System. 2. Chronic Bilateral Thoracic Pain: No complaints today :Continue current medication regime and Continue to Monitor.06/13/17 3. Osteoarthritis of left knee: S/P Cortisone Injection on 05/12/2017 by Dr. [name]. Continue with  Heat, exercise and voltaren gel. 06/13/2017 4. Rotator cuff syndrome/subacromial bursitis: No complaints voiced today.  10/ 08/2017 5. Restless legs syndrome: Continue Requip.Continue to monitor. 06/13/2017 6. Tobacco Abuse: Continue Smoking Cessation: PCP prescribed Nicotine Patches : 06/13/2017 7. Muscle Spasm: Continue Soma/ Tizanidine. 06/13/2017 9. Insomnia: Continue Seroquel. 06/13/2017 10. Neuropathic Pain: Continue Gabapentin.06/13/2017 11.Anxiety: Continue Klonopin. 06/13/2017  20 minutes of face to face patient care time was spent during this visit. All questions were encouraged and answered.  F/u in 1 month

## 2017-07-03 ENCOUNTER — Encounter: Payer: Medicare HMO | Admitting: Neurology

## 2017-07-04 ENCOUNTER — Encounter: Payer: Self-pay | Admitting: Neurology

## 2017-07-14 ENCOUNTER — Telehealth (HOSPITAL_COMMUNITY): Payer: Self-pay

## 2017-07-14 NOTE — Telephone Encounter (Signed)
Left message for pt to return call. AW 

## 2017-07-16 ENCOUNTER — Encounter: Payer: Self-pay | Admitting: Physical Medicine & Rehabilitation

## 2017-07-16 ENCOUNTER — Encounter: Payer: Medicare Other | Attending: Physical Medicine & Rehabilitation | Admitting: Physical Medicine & Rehabilitation

## 2017-07-16 VITALS — BP 128/77 | HR 87 | Resp 14

## 2017-07-16 DIAGNOSIS — M2170 Unequal limb length (acquired), unspecified site: Secondary | ICD-10-CM | POA: Diagnosis not present

## 2017-07-16 DIAGNOSIS — Z79899 Other long term (current) drug therapy: Secondary | ICD-10-CM | POA: Diagnosis not present

## 2017-07-16 DIAGNOSIS — M4316 Spondylolisthesis, lumbar region: Secondary | ICD-10-CM

## 2017-07-16 DIAGNOSIS — K219 Gastro-esophageal reflux disease without esophagitis: Secondary | ICD-10-CM | POA: Diagnosis not present

## 2017-07-16 DIAGNOSIS — G894 Chronic pain syndrome: Secondary | ICD-10-CM | POA: Insufficient documentation

## 2017-07-16 DIAGNOSIS — M5136 Other intervertebral disc degeneration, lumbar region: Secondary | ICD-10-CM

## 2017-07-16 DIAGNOSIS — M1812 Unilateral primary osteoarthritis of first carpometacarpal joint, left hand: Secondary | ICD-10-CM

## 2017-07-16 DIAGNOSIS — E785 Hyperlipidemia, unspecified: Secondary | ICD-10-CM | POA: Diagnosis not present

## 2017-07-16 DIAGNOSIS — M170 Bilateral primary osteoarthritis of knee: Secondary | ICD-10-CM | POA: Diagnosis not present

## 2017-07-16 DIAGNOSIS — E079 Disorder of thyroid, unspecified: Secondary | ICD-10-CM | POA: Insufficient documentation

## 2017-07-16 DIAGNOSIS — J449 Chronic obstructive pulmonary disease, unspecified: Secondary | ICD-10-CM | POA: Insufficient documentation

## 2017-07-16 DIAGNOSIS — E039 Hypothyroidism, unspecified: Secondary | ICD-10-CM | POA: Insufficient documentation

## 2017-07-16 DIAGNOSIS — Z9889 Other specified postprocedural states: Secondary | ICD-10-CM | POA: Diagnosis not present

## 2017-07-16 DIAGNOSIS — I10 Essential (primary) hypertension: Secondary | ICD-10-CM | POA: Diagnosis not present

## 2017-07-16 DIAGNOSIS — Z9981 Dependence on supplemental oxygen: Secondary | ICD-10-CM | POA: Insufficient documentation

## 2017-07-16 DIAGNOSIS — G2581 Restless legs syndrome: Secondary | ICD-10-CM | POA: Diagnosis not present

## 2017-07-16 DIAGNOSIS — Z5181 Encounter for therapeutic drug level monitoring: Secondary | ICD-10-CM

## 2017-07-16 DIAGNOSIS — Z720 Tobacco use: Secondary | ICD-10-CM | POA: Diagnosis not present

## 2017-07-16 DIAGNOSIS — M961 Postlaminectomy syndrome, not elsewhere classified: Secondary | ICD-10-CM | POA: Insufficient documentation

## 2017-07-16 DIAGNOSIS — G9619 Other disorders of meninges, not elsewhere classified: Secondary | ICD-10-CM | POA: Diagnosis not present

## 2017-07-16 DIAGNOSIS — M5126 Other intervertebral disc displacement, lumbar region: Secondary | ICD-10-CM | POA: Diagnosis not present

## 2017-07-16 MED ORDER — TIZANIDINE HCL 4 MG PO TABS: ORAL_TABLET | ORAL | 3 refills | Status: DC

## 2017-07-16 MED ORDER — FENTANYL 50 MCG/HR TD PT72: 50.0000 ug | MEDICATED_PATCH | TRANSDERMAL | 0 refills | Status: DC

## 2017-07-16 MED ORDER — OXYCODONE-ACETAMINOPHEN 10-325 MG PO TABS: 1.0000 | ORAL_TABLET | Freq: Four times a day (QID) | ORAL | 0 refills | Status: DC | PRN

## 2017-07-16 MED ORDER — DICLOFENAC SODIUM 75 MG PO TBEC: 75.0000 mg | DELAYED_RELEASE_TABLET | Freq: Two times a day (BID) | ORAL | 3 refills | Status: DC

## 2017-07-16 NOTE — Progress Notes (Signed)
Subjective:    Patient ID: Jill Phillips, female    DOB: 1956/03/30, 61 y.o.   MRN: [id number]  HPI  Jill Phillips is here in follow up of her chronic pain. She is now set up for potential sacral plasty by interventional radiology.  She continues to struggle quite a bit with pain in her left hip down into her rectal area.  It is often more tender with weightbearing as well as with sitting on the left side.  To review, her most recent pelvic MRI is notable for the following: 1. Subtle insufficiency fracture of the right sacral ala with adjacent edema. 2. Reactive marrow edema about the right SI joint likely degenerative in etiology. 3. No findings for the patient's rectal pain. No annular constricting mass, inflammation or adenopathy is identified within the pelvis. 4. Lumbar spondylosis with partially imaged L3-4 spinal fusion hardware. 5. Right arthroplasty limits assessment of the right hemipelvis.  Jill Phillips also has had increased pain in her left thumb base again.  She had good results with the previous injection we had done in the left hand earlier this year.  Medications will remain unchanged.  She is on a fentanyl patch 50 mcg every 72 hours.  She uses hydrocodone 10/325 1 every 6 hours as needed.  Currently she is running into some barriers with insurance regarding the hydrocodone.  We had tried Percocet at her last visit with me in May however she developed some headaches.  In hindsight now, she does not think the headaches were due to the Percocet.  Another medication she is having struggles with is the naproxen.  Her insurance Company asked her to look at other options but did not specify.  From a standpoint of spasms she is using Zanaflex and Soma.  She does not like how the Jill Phillips makes her feel and finds in general the Zanaflex is more effective.   Pain Inventory Average Pain 7 Pain Right Now 8 My pain is burning, stabbing and aching  In the last 24 hours, has pain interfered with  the following? General activity 10 Relation with others 10 Enjoyment of life 10 What TIME of day is your pain at its worst? morning, daytime, evening Sleep (in general) Fair  Pain is worse with: walking, bending, sitting, inactivity and standing Pain improves with: rest, heat/ice and medication Relief from Meds: 7  Mobility walk without assistance Do you have any goals in this area?  no  Function Do you have any goals in this area?  no  Neuro/Psych spasms  Prior Studies Any changes since last visit?  no  Physicians involved in your care Any changes since last visit?  no   Family History  Problem Relation Age of Onset  . Kidney disease Mother   . Heart disease Father   . Jill Phillips Brother 29       brain  . Heart disease Brother   . Heart disease Sister 37       s/p CABG  . Hypertension Sister   . Colon cancer Neg Hx    Social History   Socioeconomic History  . Marital status: Divorced    Spouse name: n/a  . Number of children: 2  . Years of education: 12+  . Highest education level: None  Social Needs  . Financial resource strain: None  . Food insecurity - worry: None  . Food insecurity - inability: None  . Transportation needs - medical: None  . Transportation needs - non-medical: None  Occupational  History  . Occupation: disability    Comment: back surgeries  Tobacco Use  . Smoking status: Current Every Day Smoker    Packs/day: 1.25    Years: 45.00    Pack years: 56.25    Types: Cigarettes  . Smokeless tobacco: Never Used  . Tobacco comment: form given 12/25/16  Substance and Sexual Activity  . Alcohol use: No    Alcohol/week: 0.0 oz  . Drug use: No    Comment: 01/31/2016 "none since ~ 1980"  . Sexual activity: No    Partners: Male  Other Topics Concern  . None  Social History Narrative   Lives alone.  One daughter is local, but is getting ready to move to [place], where her children live with their father.  The other daughter lives near  [place], [place].   Past Surgical History:  Procedure Laterality Date  . APPENDECTOMY    . BACK SURGERY     18 back surgeries (2 thoracic & 16 lumbar) (01/31/2016)  . DILATION AND CURETTAGE OF UTERUS    . HAMMER TOE SURGERY    . JOINT REPLACEMENT    . KNEE ARTHROSCOPY Right   . LAPAROSCOPIC CHOLECYSTECTOMY    . TOTAL HIP ARTHROPLASTY Right   . TUBAL LIGATION    . UVULOPALATOPHARYNGOPLASTY    . VAGINAL HYSTERECTOMY     Past Medical History:  Diagnosis Date  . Active smoker   . Anxiety   . Calcifying tendinitis of shoulder   . Chronic back pain   . Chronic pain syndrome   . COPD (chronic obstructive pulmonary disease) ([place])   . Dysthymic disorder   . Emphysema lung ([place])   . GERD (gastroesophageal reflux disease)   . Headache    "weekly maybe" (01/31/2016)  . Heart murmur    for years, nothing to be concerned about  . Herpes genitalia   . History of blood transfusion 1980   related to "back surgery"  . Hyperlipidemia   . Hypertension   . Hypothyroidism   . Lumbago   . Osteoarthrosis, unspecified whether generalized or localized, lower leg   . Pain in joint, upper arm   . Pneumothorax, left 01/31/2016   S/P Left posterior subcostal pain injection on 01/30/2016  . PONV (postoperative nausea and vomiting)    gets nauseous  with longer surgery. Difficuty voiding after surgery  . Postlaminectomy syndrome, thoracic region   . Primary localized osteoarthrosis, lower leg   . Restless leg syndrome   . Sleep apnea    s/p surgery- last sleep study 2011- doesnt use oxygen or machine at night as instructed,.   12/2014- Dr Halford Chessman  reports it is negative.  . Thyroid disease    BP 128/77 (BP Location: Right Arm, Patient Position: Sitting, Cuff Size: Normal)   Pulse 87   Resp 14   SpO2 91%   Opioid Risk Score:   Fall Risk Score:  `1  Depression screen PHQ 2/9  Depression screen [facility] 2/9 06/13/2017 05/15/2016 05/02/2016 12/07/2015 11/15/2015 10/13/2015 09/06/2015  Decreased Interest 0 0 0 0 0 0  0  Down, Depressed, Hopeless 0 0 0 0 0 - 0  PHQ - 2 Score 0 0 0 0 0 0 0  Altered sleeping - - - - - - -  Tired, decreased energy - - - - - - -  Change in appetite - - - - - - -  Feeling bad or failure about yourself  - - - - - - -  Trouble concentrating - - - - - - -  Moving slowly or fidgety/restless - - - - - - -  Suicidal thoughts - - - - - - -  PHQ-9 Score - - - - - - -  Some recent data might be hidden    Review of Systems  Constitutional: Negative.   HENT: Negative.   Eyes: Negative.   Respiratory: Negative.   Cardiovascular: Negative.   Gastrointestinal: Negative.   Endocrine: Negative.   Genitourinary: Negative.   Musculoskeletal: Positive for back pain and gait problem.  Skin: Negative.   Allergic/Immunologic: Negative.   Hematological: Negative.   Psychiatric/Behavioral: Negative.        Objective:   Physical Exam   Constitutional: She is oriented to person, place, and time. She appears well-developed and well-nourished.  Smells of tobacco HENT:  Head: Normocephalic.  Eyes: EOM are normal. Pupils are equal, round, and reactive to light.  Neck: Normal range of motion.  Cardiovascular: Regular rate.  Pulmonary/Chest: Normal effort.  Abdominal: Soft.  Musculoskeletal:   Right knee and right shoulder with fairly preserved range of motion and only mild tenderness with palpation and movement..  She continues to display elevation of the left hemipelvis in tenderness in the lumbar parasite spinal's on that side.  She also has pain along the left PSIS area and bursa area.  I did not do a rectal exam today.  Neurological: She is alert and oriented to person, place, and time.   Sensation is grossly normal and strength is 5 out of 5 except where she has pain.  Skin: Skin is warm.  Psychiatric:  Mood is much more upbeat and positive today.  Assessment & Plan:  ASSESSMENT:  1. 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   Also has Sacral  insufficiency fracture by MRI.  2. Osteoarthritis of the knees bilaterally, right greater than left.  3. Rotator cuff syndrome/subacromial bursitis.  4. Restless legs syndrome.  5. O2 dependent at night  6. Tobacco abuse.  7. Right leg length discrepancy. 8. Left CMC arthritis   PLAN:  1. Refilled fentanyl to 50 mcg every 72 hours. #10. Will try percocet 10/325 q6 prn for breakthrough pain #120. We will continue the opioid monitoring program, this consists of regular clinic visits, examinations, routine drug screening, pill counts as well as use of [place] Controlled Substance Reporting System. [place].               -drug swab today 2. Continue with 4mg  klonopin at night. Continue with gabapentin also.   -refilled tizanidine, increasing to 4mg -6mg  q6 prn, stop soma.  3. After informed consent and preparation of the skin with betadine and isopropyl alcohol, I injected 3mg  (1cc) of celestone and 2cc of 1% lidocaine into  the LEFT first  [place] jt via anterior approach. Additionally, aspiration was performed prior to injection. The patient tolerated well, and no complications were encountered. Afterward the area was cleaned and dressed. Post- injection instructions were provided.   4. Asked her to follow up with pulmonology about the low O2 sats. May need adjustments to her regimen. She continues to heavily smoke as well. I advised her to use her spiriva daily as written as well. 5. Pelvic mgt per Dr. [name]. I'm in agreement.  for sacral-plasty per INR.  Hopefully this is the solution for her ongoing pain. 6.  Discontinue naproxen and begin trial diclofenac 75 mg p.o. twice daily.   7. Follow up withNP in 46month. 25 minutes of face to face patient  care time were spent during this visit. All questions were encouraged and answered.

## 2017-07-16 NOTE — Patient Instructions (Signed)
PLEASE FEEL FREE TO CALL OUR OFFICE WITH ANY PROBLEMS OR QUESTIONS ([phone])    YOU MAY TRY TAKING 1 TO 1.5 TABS OF TIZANIDINE FOR MUSCLE SPASMS

## 2017-07-17 ENCOUNTER — Encounter (INDEPENDENT_AMBULATORY_CARE_PROVIDER_SITE_OTHER): Payer: Self-pay

## 2017-07-17 ENCOUNTER — Ambulatory Visit (INDEPENDENT_AMBULATORY_CARE_PROVIDER_SITE_OTHER): Payer: Medicare HMO | Admitting: Specialist

## 2017-07-21 ENCOUNTER — Ambulatory Visit (HOSPITAL_COMMUNITY)
Admission: RE | Admit: 2017-07-21 | Discharge: 2017-07-21 | Disposition: A | Discharge status: Home / Self Care | Payer: Medicare Other | Source: Ambulatory Visit | Attending: Interventional Radiology | Admitting: Interventional Radiology

## 2017-07-21 ENCOUNTER — Other Ambulatory Visit (HOSPITAL_COMMUNITY): Payer: Self-pay | Admitting: Interventional Radiology

## 2017-07-21 DIAGNOSIS — M8448XA Pathological fracture, other site, initial encounter for fracture: Secondary | ICD-10-CM

## 2017-07-21 DIAGNOSIS — S3210XA Unspecified fracture of sacrum, initial encounter for closed fracture: Secondary | ICD-10-CM

## 2017-07-21 HISTORY — PX: IR RADIOLOGIST EVAL & MGMT: IMG5224

## 2017-07-21 LAB — DRUG TOX MONITOR 1 W/CONF, ORAL FLD
Amphetamines: NEGATIVE ng/mL (ref <10)
Barbiturates: NEGATIVE ng/mL (ref <10)
Benzodiazepines: NEGATIVE ng/mL (ref <0.50)
Buprenorphine: NEGATIVE ng/mL (ref <0.10)
Carisoprodol: NEGATIVE ng/mL (ref <2.5)
Cocaine: NEGATIVE ng/mL (ref <5.0)
Cotinine: 20.9 ng/mL — ABNORMAL HIGH (ref <5.0)
Fentanyl: 1.42 ng/mL — ABNORMAL HIGH (ref <0.10)
Fentanyl: POSITIVE ng/mL — AB (ref <0.10)
Heroin Metabolite: NEGATIVE ng/mL (ref <1.0)
MARIJUANA: NEGATIVE ng/mL (ref <2.5)
MDMA: NEGATIVE ng/mL (ref <10)
Meprobamate: 56.2 ng/mL — ABNORMAL HIGH (ref <2.5)
Meprobamate: POSITIVE ng/mL — AB (ref <2.5)
Methadone: NEGATIVE ng/mL (ref <5.0)
Nicotine Metabolite: POSITIVE ng/mL — AB (ref <5.0)
Opiates: NEGATIVE ng/mL (ref <2.5)
Phencyclidine: NEGATIVE ng/mL (ref <10)
Tapentadol: NEGATIVE ng/mL (ref <5.0)
Tramadol: NEGATIVE ng/mL (ref <5.0)
Zolpidem: NEGATIVE ng/mL (ref <5.0)

## 2017-07-21 LAB — DRUG TOX ALC METAB W/CON, ORAL FLD: Alcohol Metabolite: NEGATIVE ng/mL (ref <25)

## 2017-07-22 ENCOUNTER — Other Ambulatory Visit (INDEPENDENT_AMBULATORY_CARE_PROVIDER_SITE_OTHER): Payer: Self-pay | Admitting: Orthopaedic Surgery

## 2017-07-22 ENCOUNTER — Encounter (HOSPITAL_COMMUNITY): Payer: Self-pay | Admitting: Interventional Radiology

## 2017-07-22 NOTE — Telephone Encounter (Signed)
Naproxen refill request

## 2017-07-22 NOTE — Progress Notes (Deleted)
Subjective:   Jill Phillips is a 61 y.o. female who presents for an Initial Medicare Annual Wellness Visit.  Review of Systems    No ROS.  Medicare Wellness Visit. Additional risk factors are reflected in the social history.     Sleep patterns: {SX; SLEEP PATTERNS:18802::"feels rested on waking","does not get up to void","gets up *** times nightly to void","sleeps *** hours nightly"}.    Home Safety/Smoke Alarms: Feels safe in home. Smoke alarms in place.  Living environment; residence and Firearm Safety: {Rehab home environment / accessibility:30080::"no firearms","firearms stored safely"}. Seat Belt Safety/Bike Helmet: Wears seat belt.    Objective:    There were no vitals filed for this visit. There is no height or weight on file to calculate BMI.   Current Medications (verified) Outpatient Encounter Medications as of 07/23/2017  Medication Sig  . acetaminophen (TYLENOL) 500 MG tablet Take 1,000 mg by mouth every 6 (six) hours as needed for headache.   . albuterol (PROAIR HFA) 108 (90 Base) MCG/ACT inhaler INHALE 1 PUFF INTO THE LUNGS EVERY 6 HOURS AS NEEDED FOR WHEEZING OR SHORTNESS OF BREATH.NEED OFFICE VISIT FOR FURTHER REFILLS  . albuterol (VENTOLIN HFA) 108 (90 Base) MCG/ACT inhaler INHALE 1 PUFF INTO THE LUNGS EVERY 6 HOURS AS NEEDED FOR WHEEZING OR SHORTNESS OF BREATH.NEED OFFICE VISIT FOR FURTHER REFILLS  . albuterol (VENTOLIN HFA) 108 (90 Base) MCG/ACT inhaler Inhale 2 puffs into the lungs every 6 (six) hours as needed for wheezing or shortness of breath.  [name] amLODipine (NORVASC) 10 MG tablet Take 1 tablet (10 mg total) by mouth daily.  . benazepril (LOTENSIN) 40 MG tablet TAKE 1 TABLET (40 MG TOTAL) BY MOUTH DAILY.  . clonazePAM (KLONOPIN) 2 MG tablet TAKE 1 TABLET BY MOUTH 2 TIMES A DAY AS NEEDED.  [name] diclofenac (VOLTAREN) 75 MG EC tablet Take 1 tablet (75 mg total) 2 (two) times daily with a meal by mouth.  . fentaNYL (DURAGESIC - DOSED MCG/HR) 50 MCG/HR Place 1  patch (50 mcg total) every 3 (three) days onto the skin.  [name] gabapentin (NEURONTIN) 300 MG capsule Take 1 capsule (300 mg total) by mouth 4 (four) times daily.  . hydrochlorothiazide (HYDRODIURIL) 12.5 MG tablet Take 1 tablet (12.5 mg total) by mouth daily.  [name] levothyroxine (SYNTHROID, LEVOTHROID) 75 MCG tablet TAKE 1 TABLET (75 MCG TOTAL) BY MOUTH DAILY BEFORE BREAKFAST.  [name] nicotine (NICODERM CQ) 21 mg/24hr patch Place 1 patch (21 mg total) onto the skin daily.  [name] omeprazole (PRILOSEC) 20 MG capsule TAKE 1 CAPSULE BY MOUTH 2 TIMES DAILY BEFORE A MEAL.  [name] oxyCODONE-acetaminophen (PERCOCET) 10-325 MG tablet Take 1 tablet every 6 (six) hours as needed by mouth for pain.  . pravastatin (PRAVACHOL) 40 MG tablet Take 1 tablet (40 mg total) by mouth daily.  . QUEtiapine (SEROQUEL) 100 MG tablet Take 1 tablet (100 mg total) by mouth at bedtime.  [name] rOPINIRole (REQUIP) 0.5 MG tablet TAKE  2 TABLETS AT BEDTIME.  [name] Sennosides (SENNA LAX PO) As directed  . SPIRIVA HANDIHALER 18 MCG inhalation capsule PLACE 1 CAPSULE INTO INHALER AND INHALE ONCE DAILY.  [name] tiZANidine (ZANAFLEX) 4 MG tablet TAKE 1 to 1.5 TABLETS BY MOUTH EVERY 6 HOURS AS NEEDED FOR MUSCLE PAIN/ SPASMS  . valACYclovir (VALTREX) 500 MG tablet Take 1 tablet (500 mg total) by mouth daily.  [name] venlafaxine XR (EFFEXOR-XR) 150 MG 24 hr capsule TAKE 2 CAPSULES BY MOUTH DAILY.  [name] Vitamin D, Ergocalciferol, (DRISDOL) 50000 units CAPS capsule  Take 1 capsule (50,000 Units total) by mouth every 7 (seven) days.  . VOLTAREN 1 % GEL APPLY 2 GRAMS TO AFFECTED AREA 3 TIMES A DAY AS NEEDED FOR PAIN.   Facility-Administered Encounter Medications as of 07/23/2017  Medication  . calcitonin (salmon) (MIACALCIN/FORTICAL) nasal spray 1 spray    Allergies (verified) Flagyl [metronidazole]; Sulfa antibiotics; Amoxicillin; Chlorzoxazone; Codeine; Darvocet [propoxyphene n-acetaminophen]; Dilaudid [hydromorphone hcl]; Keflex [cephalexin]; Morphine and related;  Nitrofurantoin monohyd macro; and Percocet [oxycodone-acetaminophen]   History: Past Medical History:  Diagnosis Date  . Active smoker   . Anxiety   . Calcifying tendinitis of shoulder   . Chronic back pain   . Chronic pain syndrome   . COPD (chronic obstructive pulmonary disease) ([place])   . Dysthymic disorder   . Emphysema lung ([place])   . GERD (gastroesophageal reflux disease)   . Headache    "weekly maybe" (01/31/2016)  . Heart murmur    for years, nothing to be concerned about  . Herpes genitalia   . History of blood transfusion 1980   related to "back surgery"  . Hyperlipidemia   . Hypertension   . Hypothyroidism   . Lumbago   . Osteoarthrosis, unspecified whether generalized or localized, lower leg   . Pain in joint, upper arm   . Pneumothorax, left 01/31/2016   S/P Left posterior subcostal pain injection on 01/30/2016  . PONV (postoperative nausea and vomiting)    gets nauseous  with longer surgery. Difficuty voiding after surgery  . Postlaminectomy syndrome, thoracic region   . Primary localized osteoarthrosis, lower leg   . Restless leg syndrome   . Sleep apnea    s/p surgery- last sleep study 2011- doesnt use oxygen or machine at night as instructed,.   12/2014- Dr Halford Chessman  reports it is negative.  . Thyroid disease    Past Surgical History:  Procedure Laterality Date  . APPENDECTOMY    . BACK SURGERY     18 back surgeries (2 thoracic & 16 lumbar) (01/31/2016)  . DILATION AND CURETTAGE OF UTERUS    . HAMMER TOE SURGERY    . IR RADIOLOGIST EVAL & MGMT  07/21/2017  . JOINT REPLACEMENT    . KNEE ARTHROSCOPY Right   . LAPAROSCOPIC CHOLECYSTECTOMY    . LUMBAR FUSION N/A 08/01/2015   Procedure: Right sided L1-2 and L2-3 transforaminal lumbar interbody fusion with cages, Extension of posterior fusion T12 to L3, Replaced pedicle screws bilaterally L1-L2 , Replaced left sided pedicle screws L-3. Instrumentation T12 to L3 using local bone graft, Vivigen allograft and cancellous  chips;  Surgeon: [name], MD;  Location: [facility];  Service: Orthopedics;  Laterality: N/A;  . LUMBAR LAMINECTOMY/DECOMPRESSION MICRODISCECTOMY N/A 01/28/2014   Procedure: Minimally Invasive Right  L1-2 Microdiscectomy;  Surgeon: [name], MD;  Location: [facility];  Service: Orthopedics;  Laterality: N/A;  . TOTAL HIP ARTHROPLASTY Right   . TOTAL KNEE ARTHROPLASTY  05/29/2012   Procedure: TOTAL KNEE ARTHROPLASTY;  Surgeon: Mcarthur Rossetti, MD;  Location: [facility];  Service: Orthopedics;  Laterality: Right;  Right Total Knee Arthroplasty  . TUBAL LIGATION    . UVULOPALATOPHARYNGOPLASTY    . VAGINAL HYSTERECTOMY     Family History  Problem Relation Age of Onset  . Kidney disease Mother   . Heart disease Father   . Anuerysm Brother 29       brain  . Heart disease Brother   . Heart disease Sister 73       s/p  CABG  . Hypertension Sister   . Colon cancer Neg Hx    Social History   Occupational History  . Occupation: disability    Comment: back surgeries  Tobacco Use  . Smoking status: Current Every Day Smoker    Packs/day: 1.25    Years: 45.00    Pack years: 56.25    Types: Cigarettes  . Smokeless tobacco: Never Used  . Tobacco comment: form given 12/25/16  Substance and Sexual Activity  . Alcohol use: No    Alcohol/week: 0.0 oz  . Drug use: No    Comment: 01/31/2016 "none since ~ 1980"  . Sexual activity: No    Partners: Male    Tobacco Counseling Ready to quit: Not Answered Counseling given: Not Answered Comment: form given 12/25/16   Activities of Daily Living No flowsheet data found.  Immunizations and Health Maintenance Immunization History  Administered Date(s) Administered  . Influenza, Seasonal, Injecte, Preservative Fre 08/05/2012  . Influenza,inj,Quad PF,6+ Mos 05/05/2014, 05/22/2015, 07/29/2016  . Influenza-Unspecified 07/16/2017  . Tdap 07/14/2013   Health Maintenance Due  Topic Date Due  . MAMMOGRAM  02/05/2005  . COLON CANCER SCREENING  ANNUAL FOBT  02/05/2005    Patient Care Team: [name], MD as PCP - General (Internal Medicine) Dennard Schaumann Cammie Mcgee, MD as PCP - Family Medicine (Family Medicine)  Indicate any recent Medical Services you may have received from other than [facility] providers in the past year (date may be approximate).     Assessment:   This is a routine wellness examination for Nasiya.Physical assessment deferred to PCP.   Hearing/Vision screen No exam data present  Dietary issues and exercise activities discussed:   Diet (meal preparation, eat out, water intake, caffeinated beverages, dairy products, fruits and vegetables): {Desc; diets:16563}  Goals    None     Depression Screen PHQ 2/9 Scores 06/13/2017 05/15/2016 05/02/2016 12/07/2015 11/15/2015 10/13/2015 09/06/2015  PHQ - 2 Score 0 0 0 0 0 0 0  PHQ- 9 Score - - - - - - -    Fall Risk Fall Risk  07/16/2017 06/13/2017 02/19/2017 09/06/2016 08/08/2016  Falls in the past year? No No No No Yes  Comment - - - - -  Number falls in past yr: - - - - 1  Injury with Fall? - - - - No  Risk Factor Category  - - - - -  Risk for fall due to : - - - - -  Follow up - - - - Falls evaluation completed;Education provided;Falls prevention discussed  Comment - - - - -    Cognitive Function:        Screening Tests Health Maintenance  Topic Date Due  . MAMMOGRAM  02/05/2005  . COLON CANCER SCREENING ANNUAL FOBT  02/05/2005  . COLONOSCOPY  11/23/2019  . TETANUS/TDAP  07/15/2023  . INFLUENZA VACCINE  Completed  . Hepatitis C Screening  Completed  . HIV Screening  Completed      Plan:     I have personally reviewed and noted the following in the patient's chart:   . Medical and social history . Use of alcohol, tobacco or illicit drugs  . Current medications and supplements . Functional ability and status . Nutritional status . Physical activity . Advanced directives . List of other physicians . Vitals . Screenings to include cognitive,  depression, and falls . Referrals and appointments  In addition, I have reviewed and discussed with patient certain preventive protocols, quality metrics, and best  practice recommendations. A written personalized care plan for preventive services as well as general preventive health recommendations were provided to patient.     Michiel Cowboy, RN   07/22/2017

## 2017-07-23 ENCOUNTER — Telehealth: Payer: Self-pay | Admitting: *Deleted

## 2017-07-23 ENCOUNTER — Other Ambulatory Visit: Payer: Self-pay | Admitting: Radiology

## 2017-07-23 ENCOUNTER — Other Ambulatory Visit: Payer: Self-pay | Admitting: Student

## 2017-07-23 ENCOUNTER — Ambulatory Visit: Payer: Medicare HMO

## 2017-07-23 NOTE — Telephone Encounter (Signed)
Oral swab drug screen Is positive for Fentanyl and meprobamate which is a metabolite of Soma.  It does not show hydrocodone even though she reported taking it last (1/2/tab) on the day of the test and had #2 pills at the appt. She reported t ineffectve and was switched to oxycodone.

## 2017-07-25 ENCOUNTER — Ambulatory Visit (HOSPITAL_COMMUNITY): Admission: RE | Admit: 2017-07-25 | Payer: Medicare Other | Source: Ambulatory Visit

## 2017-07-26 ENCOUNTER — Other Ambulatory Visit: Payer: Self-pay | Admitting: Registered Nurse

## 2017-07-30 ENCOUNTER — Ambulatory Visit: Payer: Medicare HMO | Admitting: Pulmonary Disease

## 2017-07-31 ENCOUNTER — Other Ambulatory Visit: Payer: Self-pay | Admitting: Registered Nurse

## 2017-08-08 ENCOUNTER — Other Ambulatory Visit: Payer: Self-pay | Admitting: Internal Medicine

## 2017-08-09 ENCOUNTER — Other Ambulatory Visit: Payer: Self-pay | Admitting: Internal Medicine

## 2017-08-14 ENCOUNTER — Encounter: Payer: Self-pay | Admitting: Registered Nurse

## 2017-08-14 ENCOUNTER — Encounter: Payer: Medicare Other | Attending: Physical Medicine & Rehabilitation | Admitting: Registered Nurse

## 2017-08-14 VITALS — BP 129/81 | HR 88

## 2017-08-14 DIAGNOSIS — K219 Gastro-esophageal reflux disease without esophagitis: Secondary | ICD-10-CM | POA: Insufficient documentation

## 2017-08-14 DIAGNOSIS — Z720 Tobacco use: Secondary | ICD-10-CM | POA: Insufficient documentation

## 2017-08-14 DIAGNOSIS — M5126 Other intervertebral disc displacement, lumbar region: Secondary | ICD-10-CM | POA: Diagnosis not present

## 2017-08-14 DIAGNOSIS — G894 Chronic pain syndrome: Secondary | ICD-10-CM | POA: Diagnosis not present

## 2017-08-14 DIAGNOSIS — K6289 Other specified diseases of anus and rectum: Secondary | ICD-10-CM | POA: Diagnosis not present

## 2017-08-14 DIAGNOSIS — E039 Hypothyroidism, unspecified: Secondary | ICD-10-CM | POA: Insufficient documentation

## 2017-08-14 DIAGNOSIS — G8929 Other chronic pain: Secondary | ICD-10-CM

## 2017-08-14 DIAGNOSIS — E785 Hyperlipidemia, unspecified: Secondary | ICD-10-CM | POA: Insufficient documentation

## 2017-08-14 DIAGNOSIS — E079 Disorder of thyroid, unspecified: Secondary | ICD-10-CM | POA: Diagnosis not present

## 2017-08-14 DIAGNOSIS — I10 Essential (primary) hypertension: Secondary | ICD-10-CM | POA: Insufficient documentation

## 2017-08-14 DIAGNOSIS — M5136 Other intervertebral disc degeneration, lumbar region: Secondary | ICD-10-CM

## 2017-08-14 DIAGNOSIS — M62830 Muscle spasm of back: Secondary | ICD-10-CM | POA: Diagnosis not present

## 2017-08-14 DIAGNOSIS — Z9889 Other specified postprocedural states: Secondary | ICD-10-CM | POA: Diagnosis not present

## 2017-08-14 DIAGNOSIS — G2581 Restless legs syndrome: Secondary | ICD-10-CM | POA: Diagnosis not present

## 2017-08-14 DIAGNOSIS — M2170 Unequal limb length (acquired), unspecified site: Secondary | ICD-10-CM | POA: Insufficient documentation

## 2017-08-14 DIAGNOSIS — F411 Generalized anxiety disorder: Secondary | ICD-10-CM

## 2017-08-14 DIAGNOSIS — M7918 Myalgia, other site: Secondary | ICD-10-CM

## 2017-08-14 DIAGNOSIS — M4316 Spondylolisthesis, lumbar region: Secondary | ICD-10-CM

## 2017-08-14 DIAGNOSIS — J449 Chronic obstructive pulmonary disease, unspecified: Secondary | ICD-10-CM | POA: Insufficient documentation

## 2017-08-14 DIAGNOSIS — M170 Bilateral primary osteoarthritis of knee: Secondary | ICD-10-CM | POA: Diagnosis not present

## 2017-08-14 DIAGNOSIS — Z9981 Dependence on supplemental oxygen: Secondary | ICD-10-CM | POA: Insufficient documentation

## 2017-08-14 DIAGNOSIS — M961 Postlaminectomy syndrome, not elsewhere classified: Secondary | ICD-10-CM | POA: Diagnosis not present

## 2017-08-14 DIAGNOSIS — Z5181 Encounter for therapeutic drug level monitoring: Secondary | ICD-10-CM | POA: Insufficient documentation

## 2017-08-14 DIAGNOSIS — G9619 Other disorders of meninges, not elsewhere classified: Secondary | ICD-10-CM | POA: Diagnosis not present

## 2017-08-14 DIAGNOSIS — G4709 Other insomnia: Secondary | ICD-10-CM | POA: Diagnosis not present

## 2017-08-14 DIAGNOSIS — Z79899 Other long term (current) drug therapy: Secondary | ICD-10-CM | POA: Insufficient documentation

## 2017-08-14 MED ORDER — CLONAZEPAM 2 MG PO TABS: ORAL_TABLET | ORAL | 2 refills | Status: DC

## 2017-08-14 MED ORDER — QUETIAPINE FUMARATE 100 MG PO TABS: 100.0000 mg | ORAL_TABLET | Freq: Every day | ORAL | 2 refills | Status: DC

## 2017-08-14 MED ORDER — OXYCODONE-ACETAMINOPHEN 10-325 MG PO TABS: 1.0000 | ORAL_TABLET | Freq: Four times a day (QID) | ORAL | 0 refills | Status: DC | PRN

## 2017-08-14 MED ORDER — GABAPENTIN 300 MG PO CAPS: 300.0000 mg | ORAL_CAPSULE | Freq: Four times a day (QID) | ORAL | 5 refills | Status: DC

## 2017-08-14 MED ORDER — ROPINIROLE HCL 0.5 MG PO TABS: ORAL_TABLET | ORAL | 2 refills | Status: DC

## 2017-08-14 MED ORDER — FENTANYL 50 MCG/HR TD PT72: 50.0000 ug | MEDICATED_PATCH | TRANSDERMAL | 0 refills | Status: DC

## 2017-08-14 NOTE — Progress Notes (Signed)
Subjective:    Patient ID: Jill Phillips, female    DOB: [birth year], 61 y.o.   MRN: [id number]  HPI: Jill Phillips is a 61 year old female who returns for follow up appointmentfor chronic pain and medication refill. She states her pain is located in her lower back and has rectal pain pain.  Also reports her surgery was denied by her insurance company, Dr. Estanislado Pandy will be appealing the decison she reports.   Ms. Pressey Morphine equivalent is  180.00 MME.  She's also prescribed Klonopin. We have reviewed the black box warning of using opioids and benzodiazepines. I highlighted the dangers of using these drugs together and discussed the adverse events including respiratory suppression, overdose, cognitive impairment and importance of  compliance with current regimen. She verbalizes understanding, we will continue to monitor and adjust as indicated.    She rates her pain 7. Her current exercise regime is walking.   Daughter in the room.  Oral Swab  was performed on 07/16/2017, it was positive for Fentanyl, see note for details.   Pain Inventory Average Pain 8 Pain Right Now 7 My pain is dull, stabbing and aching  In the last 24 hours, has pain interfered with the following? General activity 9 Relation with others 7 Enjoyment of life 6 What TIME of day is your pain at its worst? all Sleep (in general) Poor  Pain is worse with: . Pain improves with: . Relief from Meds: 5  Mobility walk without assistance ability to climb steps?  no do you drive?  yes transfers alone  Function Do you have any goals in this area?  no  Neuro/Psych numbness spasms  Prior Studies Any changes since last visit?  no  Physicians involved in your care Any changes since last visit?  yes   Family History  Problem Relation Age of Onset  . Kidney disease Mother   . Heart disease Father   . Anuerysm Brother 29       brain  . Heart disease Brother   . Heart disease Sister 45       s/p  CABG  . Hypertension Sister   . Colon cancer Neg Hx    Social History   Socioeconomic History  . Marital status: Divorced    Spouse name: n/a  . Number of children: 2  . Years of education: 12+  . Highest education level: None  Social Needs  . Financial resource strain: None  . Food insecurity - worry: None  . Food insecurity - inability: None  . Transportation needs - medical: None  . Transportation needs - non-medical: None  Occupational History  . Occupation: disability    Comment: back surgeries  Tobacco Use  . Smoking status: Current Every Day Smoker    Packs/day: 1.25    Years: 45.00    Pack years: 56.25    Types: Cigarettes  . Smokeless tobacco: Never Used  . Tobacco comment: form given 12/25/16  Substance and Sexual Activity  . Alcohol use: No    Alcohol/week: 0.0 oz  . Drug use: No    Comment: 01/31/2016 "none since ~ 1980"  . Sexual activity: No    Partners: Male  Other Topics Concern  . None  Social History Narrative   Lives alone.  One daughter is local, but is getting ready to move to [place], where her children live with their father.  The other daughter lives near [place], [place].   Past Surgical History:  Procedure  Laterality Date  . APPENDECTOMY    . BACK SURGERY     18 back surgeries (2 thoracic & 16 lumbar) (01/31/2016)  . DILATION AND CURETTAGE OF UTERUS    . HAMMER TOE SURGERY    . IR RADIOLOGIST EVAL & MGMT  07/21/2017  . JOINT REPLACEMENT    . KNEE ARTHROSCOPY Right   . LAPAROSCOPIC CHOLECYSTECTOMY    . LUMBAR FUSION N/A 08/01/2015   Procedure: Right sided L1-2 and L2-3 transforaminal lumbar interbody fusion with cages, Extension of posterior fusion T12 to L3, Replaced pedicle screws bilaterally L1-L2 , Replaced left sided pedicle screws L-3. Instrumentation T12 to L3 using local bone graft, Vivigen allograft and cancellous chips;  Surgeon: [name], MD;  Location: [facility];  Service: Orthopedics;  Laterality: N/A;  . LUMBAR  LAMINECTOMY/DECOMPRESSION MICRODISCECTOMY N/A 01/28/2014   Procedure: Minimally Invasive Right  L1-2 Microdiscectomy;  Surgeon: [name], MD;  Location: [facility];  Service: Orthopedics;  Laterality: N/A;  . TOTAL HIP ARTHROPLASTY Right   . TOTAL KNEE ARTHROPLASTY  05/29/2012   Procedure: TOTAL KNEE ARTHROPLASTY;  Surgeon: Mcarthur Rossetti, MD;  Location: [facility];  Service: Orthopedics;  Laterality: Right;  Right Total Knee Arthroplasty  . TUBAL LIGATION    . UVULOPALATOPHARYNGOPLASTY    . VAGINAL HYSTERECTOMY     Past Medical History:  Diagnosis Date  . Active smoker   . Anxiety   . Calcifying tendinitis of shoulder   . Chronic back pain   . Chronic pain syndrome   . COPD (chronic obstructive pulmonary disease) ([place])   . Dysthymic disorder   . Emphysema lung ([place])   . GERD (gastroesophageal reflux disease)   . Headache    "weekly maybe" (01/31/2016)  . Heart murmur    for years, nothing to be concerned about  . Herpes genitalia   . History of blood transfusion 1980   related to "back surgery"  . Hyperlipidemia   . Hypertension   . Hypothyroidism   . Lumbago   . Osteoarthrosis, unspecified whether generalized or localized, lower leg   . Pain in joint, upper arm   . Pneumothorax, left 01/31/2016   S/P Left posterior subcostal pain injection on 01/30/2016  . PONV (postoperative nausea and vomiting)    gets nauseous  with longer surgery. Difficuty voiding after surgery  . Postlaminectomy syndrome, thoracic region   . Primary localized osteoarthrosis, lower leg   . Restless leg syndrome   . Sleep apnea    s/p surgery- last sleep study 2011- doesnt use oxygen or machine at night as instructed,.   12/2014- Dr Halford Chessman  reports it is negative.  . Thyroid disease    BP 129/81   Pulse 88   SpO2 92%   Opioid Risk Score:  6 Fall Risk Score:  `1  Depression screen PHQ 2/9  Depression screen [facility] 2/9 06/13/2017 05/15/2016 05/02/2016 12/07/2015 11/15/2015 10/13/2015 09/06/2015  Decreased  Interest 0 0 0 0 0 0 0  Down, Depressed, Hopeless 0 0 0 0 0 - 0  PHQ - 2 Score 0 0 0 0 0 0 0  Altered sleeping - - - - - - -  Tired, decreased energy - - - - - - -  Change in appetite - - - - - - -  Feeling bad or failure about yourself  - - - - - - -  Trouble concentrating - - - - - - -  Moving slowly or fidgety/restless - - - - - - -  Suicidal thoughts - - - - - - -  PHQ-9 Score - - - - - - -  Some recent data might be hidden      Review of Systems  Constitutional: Negative.   HENT: Negative.   Eyes: Negative.   Respiratory: Positive for shortness of breath.   Cardiovascular: Negative.   Gastrointestinal: Negative.   Endocrine: Negative.   Genitourinary: Negative.   Musculoskeletal: Negative.   Skin: Negative.   Allergic/Immunologic: Negative.   Neurological: Negative.   Hematological: Negative.   Psychiatric/Behavioral: Negative.   All other systems reviewed and are negative.      Objective:   Physical Exam  Constitutional: She is oriented to person, place, and time. She appears well-developed and well-nourished.  HENT:  Head: Normocephalic and atraumatic.  Neck: Normal range of motion. Neck supple.  Cardiovascular: Normal rate and regular rhythm.  Pulmonary/Chest: Effort normal and breath sounds normal.  Musculoskeletal:  Normal Muscle Bulk and Muscle Testing Reveals: Upper Extremities: Full ROM and Muscle Strength 5/5 Thoracic Paraspinal Tenderness: T-7-T-9 Lower Extremities: Full ROM and Muscle Strength 5/5 Arises from Table with ease Narrow Based gait   Neurological: She is alert and oriented to person, place, and time.  Skin: Skin is warm and dry.  Psychiatric: She has a normal mood and affect.  Nursing note and vitals reviewed.         Assessment & Plan:  1. Chronic lumbar spine pain/post-lami syndrome: 08/14/2017 Refilled: Fentanyl 50 MCG one patch every three days #10 and Oxycodone 10/325 mg one tablet every 6 hours as needed for pain  #120. We will continue the opioid monitoring program, this consists of regular clinic visits, examinations, urine drug screen, pill counts as well as use of [place] Controlled Substance Reporting System. 2. Chronic Bilateral Thoracic Pain: No complaints today :Continue current medication regime and Continue to Monitor.08/14/17 3. Osteoarthritis of left knee: S/P Cortisone Injection on 05/12/2017 by Dr. [name]. Continue with Heat, exercise and voltaren gel. 08/14/2017 4. Rotator cuff syndrome/subacromial bursitis: No complaints voiced today.  12/ 13/2018 5. Restless legs syndrome: Continue Requip.Continue to monitor. 08/14/2017 6. Tobacco Abuse: Continue Smoking Cessation: PCP prescribed Nicotine Patches : 08/14/2017 7. Muscle Spasm: Continue Tizanidine. 08/14/2017 9. Insomnia: Continue Seroquel. 08/14/2017 10. Neuropathic Pain: Continue Gabapentin.08/14/2017 11.Anxiety: Continue Klonopin. 08/14/2017  20 minutes of face to face patient care time was spent during this visit. All questions were encouraged and answered.  F/u in 1 month

## 2017-08-15 ENCOUNTER — Telehealth (INDEPENDENT_AMBULATORY_CARE_PROVIDER_SITE_OTHER): Payer: Self-pay | Admitting: Specialist

## 2017-08-15 ENCOUNTER — Ambulatory Visit: Payer: Medicare Other | Admitting: Registered Nurse

## 2017-08-15 NOTE — Telephone Encounter (Signed)
Patient called advised the insurance company denied the procedure that Dr. Estanislado Pandy was going to do because the procedure would not help her back. Patient advised it's the hardware that was put in her back that fractured her tailbone in several places. Patient asked if there is anything Dr [name] can do to help get the procedure approved by the insurance company. The number to contact patient is [phone]

## 2017-08-15 NOTE — Telephone Encounter (Signed)
Patient called advised the insurance company denied the procedure that Dr. Estanislado Pandy was going to do because the procedure would not help her back. Patient advised it's the hardware that was put in her back that fractured her tailbone in several places. Patient asked if there is anything Dr [name] can do to help get the procedure approved by the insurance company. The number to contact patient is [phone]

## 2017-08-19 ENCOUNTER — Other Ambulatory Visit: Payer: Self-pay | Admitting: Emergency Medicine

## 2017-08-19 MED ORDER — HYDROCHLOROTHIAZIDE 12.5 MG PO TABS: 12.5000 mg | ORAL_TABLET | Freq: Every day | ORAL | 0 refills | Status: DC

## 2017-08-20 ENCOUNTER — Other Ambulatory Visit: Payer: Self-pay | Admitting: Internal Medicine

## 2017-08-22 ENCOUNTER — Encounter (INDEPENDENT_AMBULATORY_CARE_PROVIDER_SITE_OTHER): Payer: Self-pay | Admitting: Specialist

## 2017-08-22 ENCOUNTER — Ambulatory Visit (INDEPENDENT_AMBULATORY_CARE_PROVIDER_SITE_OTHER): Payer: Medicare Other | Admitting: Specialist

## 2017-08-22 VITALS — BP 151/88 | HR 97 | Ht 64.0 in | Wt 159.0 lb

## 2017-08-22 DIAGNOSIS — M533 Sacrococcygeal disorders, not elsewhere classified: Secondary | ICD-10-CM | POA: Diagnosis not present

## 2017-08-22 DIAGNOSIS — M8448XG Pathological fracture, other site, subsequent encounter for fracture with delayed healing: Secondary | ICD-10-CM | POA: Diagnosis not present

## 2017-08-22 NOTE — Progress Notes (Signed)
Office Visit Note   Patient: Jill Phillips           Date of Birth: 1956/02/11           MRN: [id number] Visit Date: 08/22/2017              Requested by: [name], MD [place], [place] [id number] PCP: [name], MD   Assessment & Plan: Visit Diagnoses:  1. Sacral insufficiency fracture with delayed healing   2. Sacroiliac joint disease     Plan: Avoid bending, stooping and avoid lifting weights greater than 10 lbs. Avoid prolong standing and walking. Avoid frequent bending and stooping  No lifting greater than 10 lbs. May use ice or moist heat for pain. Weight loss is of benefit. Handicap license is approved. Will schedule an appointment to see Dr. Lorin Mercy to consider a right SI joint fusion for combined SI arthrosis and sacral insufficiency fracture. It is doubtful that she will obtain total  Relief with sacroplasty alone and her insurance [place] and [place] are not willing to allow a sacroplasty CT scan of the pelvis ordered due to persistent sacral pain. Follow-Up Instructions: Return in about 2 weeks (around 09/05/2017) for Return in 2 weeks to see Dr. Lorin Mercy to consider a right SI fusion..   Orders:   Orders Placed This Encounter  Procedures  . CT PELVIS WO CONTRAST   No orders of the defined types were placed in this encounter.     Procedures: No procedures performed   Clinical Data: No additional findings.   Subjective: Chief Complaint  Patient presents with  . Lower Back - Pain    61 year old female with 10 months history of right buttock and sacral pain she describes as being into her rectum. The pain is severe. I referred her to interventional radiology for consideration of a sacroplasty of the right sacral alae in attempts to try and improve her pain complains. She reports that her insurance company denied the sacroplasty procedure Proposed to try and improve her pain pattern.     Review of Systems    Constitutional: Positive for activity change and unexpected weight change. Negative for appetite change, chills, diaphoresis, fatigue and fever.  HENT: Positive for voice change. Negative for congestion, rhinorrhea, sinus pressure, sinus pain, sneezing and trouble swallowing.   Eyes: Negative for photophobia, pain, discharge, redness, itching and visual disturbance.  Respiratory: Negative for apnea, chest tightness, shortness of breath and wheezing.   Cardiovascular: Negative for chest pain.  Gastrointestinal: Negative for abdominal distention, abdominal pain, anal bleeding, blood in stool, constipation, diarrhea and nausea.  Endocrine: Negative for cold intolerance and heat intolerance.  Genitourinary: Negative for difficulty urinating, dysuria, enuresis and flank pain.  Musculoskeletal: Positive for back pain, gait problem and myalgias. Negative for arthralgias, joint swelling, neck pain and neck stiffness.  Skin: Negative for color change, pallor, rash and wound.  Allergic/Immunologic: Negative for environmental allergies, food allergies and immunocompromised state.  Neurological: Negative for dizziness, tremors, seizures, syncope, facial asymmetry, speech difficulty, weakness, light-headedness, numbness and headaches.  Hematological: Negative for adenopathy. Does not bruise/bleed easily.  Psychiatric/Behavioral: Negative for agitation, behavioral problems, confusion, decreased concentration, dysphoric mood, hallucinations, self-injury, sleep disturbance and suicidal ideas. The patient is not nervous/anxious and is not hyperactive.      Objective: Vital Signs: BP (!) 151/88 (BP Location: Left Arm, Patient Position: Sitting)   Pulse 97   Ht 5\' 4"  (1.626 m)  Wt 159 lb (72.1 kg)   BMI 27.29 kg/m   Physical Exam  Constitutional: She is oriented to person, place, and time. She appears well-developed and well-nourished.  HENT:  Head: Normocephalic and atraumatic.  Eyes: EOM are normal.  Pupils are equal, round, and reactive to light.  Neck: Normal range of motion. Neck supple.  Pulmonary/Chest: Effort normal and breath sounds normal.  Abdominal: Soft. Bowel sounds are normal.  Neurological: She is alert and oriented to person, place, and time.  Skin: Skin is warm and dry.  Psychiatric: She has a normal mood and affect. Her behavior is normal. Judgment and thought content normal.    Back Exam   Tenderness  The patient is experiencing tenderness in the lumbar and sacroiliac.  Range of Motion  Extension:  70 abnormal  Flexion:  60 abnormal  Lateral bend right: abnormal  Lateral bend left: abnormal  Rotation right: abnormal  Rotation left: abnormal   Muscle Strength  Right Quadriceps:  5/5  Left Quadriceps:  5/5  Right Hamstrings:  5/5  Left Hamstrings:  5/5   Tests  Straight leg raise right: negative Straight leg raise left: negative  Reflexes  Patellar: Hyporeflexic Achilles: Hyporeflexic Babinski's sign: normal   Other  Toe walk: normal Heel walk: normal Sensation: normal Gait: normal  Erythema: no back redness Scars: absent      Specialty Comments:  No specialty comments available.  Imaging: No results found.   PMFS History: Patient Active Problem List   Diagnosis Date Noted  . Degenerative disc disease, lumbar 08/01/2015    Priority: High    Class: Chronic  . Spondylolisthesis of lumbar region 08/01/2015    Priority: High    Class: Chronic  . COPD (chronic obstructive pulmonary disease) ([place]) 04/21/2017  . Lower back pain 03/03/2017  . CKD (chronic kidney disease) stage 3, GFR 30-59 ml/min ([facility]) 08/07/2016  . GERD (gastroesophageal reflux disease) 08/07/2016  . Prediabetes 08/07/2016  . Chronic respiratory failure ([place]) 07/29/2016  . Hypersomnia 07/29/2016  . Arthritis of carpometacarpal [facility]) joint of left thumb 07/09/2016  . Lump of axilla 05/02/2016  . Pneumothorax on left 01/31/2016  . Chronic, continuous use of opioids  09/06/2015  . Urinary, incontinence, stress female 05/06/2014  . Constipation 05/05/2014  . 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 HTN (hypertension) 05/19/2013  . HLD (hyperlipidemia) 05/19/2013  . Depression 05/19/2013  . Hypothyroidism 05/19/2013  . Tobacco abuse   . Osteoarthritis of both knees 11/18/2011  . RLS (restless legs syndrome) 11/18/2011   Past Medical History:  Diagnosis Date  . Active smoker   . Anxiety   . Calcifying tendinitis of shoulder   . Chronic back pain   . Chronic pain syndrome   . COPD (chronic obstructive pulmonary disease) ([place])   . Dysthymic disorder   . Emphysema lung ([place])   . GERD (gastroesophageal reflux disease)   . Headache    "weekly maybe" (01/31/2016)  . Heart murmur    for years, nothing to be concerned about  . Herpes genitalia   . History of blood transfusion 1980   related to "back surgery"  . Hyperlipidemia   . Hypertension   . Hypothyroidism   . Lumbago   . Osteoarthrosis, unspecified whether generalized or localized, lower leg   . Pain in joint, upper arm   . Pneumothorax, left 01/31/2016   S/P Left posterior subcostal pain injection on 01/30/2016  . PONV (postoperative nausea and vomiting)    gets nauseous  with longer surgery. Difficuty  voiding after surgery  . Postlaminectomy syndrome, thoracic region   . Primary localized osteoarthrosis, lower leg   . Restless leg syndrome   . Sleep apnea    s/p surgery- last sleep study 2011- doesnt use oxygen or machine at night as instructed,.   12/2014- Dr Halford Chessman  reports it is negative.  . Thyroid disease     Family History  Problem Relation Age of Onset  . Kidney disease Mother   . Heart disease Father   . Anuerysm Brother 29       brain  . Heart disease Brother   . Heart disease Sister 14       s/p CABG  . Hypertension Sister   . Colon cancer Neg Hx     Past Surgical History:  Procedure Laterality Date  . APPENDECTOMY    . BACK SURGERY     18 back surgeries (2 thoracic & 16 lumbar)  (01/31/2016)  . DILATION AND CURETTAGE OF UTERUS    . HAMMER TOE SURGERY    . IR RADIOLOGIST EVAL & MGMT  07/21/2017  . JOINT REPLACEMENT    . KNEE ARTHROSCOPY Right   . LAPAROSCOPIC CHOLECYSTECTOMY    . LUMBAR FUSION N/A 08/01/2015   Procedure: Right sided L1-2 and L2-3 transforaminal lumbar interbody fusion with cages, Extension of posterior fusion T12 to L3, Replaced pedicle screws bilaterally L1-L2 , Replaced left sided pedicle screws L-3. Instrumentation T12 to L3 using local bone graft, Vivigen allograft and cancellous chips;  Surgeon: [name], MD;  Location: [facility];  Service: Orthopedics;  Laterality: N/A;  . LUMBAR LAMINECTOMY/DECOMPRESSION MICRODISCECTOMY N/A 01/28/2014   Procedure: Minimally Invasive Right  L1-2 Microdiscectomy;  Surgeon: [name], MD;  Location: [place] [facility];  Service: Orthopedics;  Laterality: N/A;  . TOTAL HIP ARTHROPLASTY Right   . TOTAL KNEE ARTHROPLASTY  05/29/2012   Procedure: TOTAL KNEE ARTHROPLASTY;  Surgeon: Mcarthur Rossetti, MD;  [facility];  Service: Orthopedics;  Laterality: Right;  Right Total Knee Arthroplasty  . TUBAL LIGATION    . UVULOPALATOPHARYNGOPLASTY    . VAGINAL HYSTERECTOMY     Social History   Occupational History  . Occupation: disability    Comment: back surgeries  Tobacco Use  . Smoking status: Current Every Day Smoker    Packs/day: 1.25    Years: 45.00    Pack years: 56.25    Types: Cigarettes  . Smokeless tobacco: Never Used  . Tobacco comment: form given 12/25/16  Substance and Sexual Activity  . Alcohol use: No    Alcohol/week: 0.0 oz  . Drug use: No    Comment: 01/31/2016 "none since ~ 1980"  . Sexual activity: No    Partners: Male

## 2017-08-22 NOTE — Patient Instructions (Addendum)
Avoid bending, stooping and avoid lifting weights greater than 10 lbs. Avoid prolong standing and walking. Avoid frequent bending and stooping  No lifting greater than 10 lbs. May use ice or moist heat for pain. Weight loss is of benefit. Handicap license is approved. Will schedule an appointment to see Dr. Lorin Mercy to consider a right SI joint fusion for combined SI arthrosis and sacral insufficiency fracture. It is doubtful that she will obtain total  Relief with sacroplasty alone and her insurance [place] and [place] are not willing to allow a sacroplasty CT scan of the pelvis ordered due to persistent sacral pain.

## 2017-08-27 ENCOUNTER — Other Ambulatory Visit: Payer: Self-pay | Admitting: Internal Medicine

## 2017-08-27 DIAGNOSIS — I10 Essential (primary) hypertension: Secondary | ICD-10-CM

## 2017-09-04 ENCOUNTER — Ambulatory Visit
Admission: RE | Admit: 2017-09-04 | Discharge: 2017-09-04 | Disposition: A | Discharge status: Home / Self Care | Payer: Medicare Other | Source: Ambulatory Visit | Attending: Specialist | Admitting: Specialist

## 2017-09-04 DIAGNOSIS — M8448XG Pathological fracture, other site, subsequent encounter for fracture with delayed healing: Secondary | ICD-10-CM

## 2017-09-04 DIAGNOSIS — M533 Sacrococcygeal disorders, not elsewhere classified: Secondary | ICD-10-CM

## 2017-09-04 DIAGNOSIS — S3210XA Unspecified fracture of sacrum, initial encounter for closed fracture: Secondary | ICD-10-CM | POA: Diagnosis not present

## 2017-09-04 NOTE — Progress Notes (Signed)
Subjective:    Patient ID: Jill Phillips, female    DOB: July 18, 1956, 62 y.o.   MRN: [id number]  HPI The patient is here for follow up.  Hypertension: She is taking her medication daily. She is compliant with a low sodium diet.  She denies chest pain ( other than with stress), palpitations, edema, shortness of breath and lightheadedness. She is not exercising regularly.      Hyperlipidemia: She is taking her medication daily. She is compliant with a low fat/cholesterol diet. She is not exercising regularly. She denies myalgias.   Prediabetes:  She is compliant with a low sugar/carbohydrate diet.  She is not exercising regularly.   GERD:  She is taking her medication daily as prescribed.  She has occasional GERD at night.    Hypothyroidism:  She is taking her medication daily.  She denies any recent changes in energy or weight that are unexplained.   CKD:  She does not drink a lot of water during the day.  She has one naprosyn daily of 500  Mg.    Medications and allergies reviewed with patient and updated if appropriate.  Patient Active Problem List   Diagnosis Date Noted  . COPD (chronic obstructive pulmonary disease) ([place]) 04/21/2017  . Lower back pain 03/03/2017  . CKD (chronic kidney disease) stage 3, GFR 30-59 ml/min (HCC) 08/07/2016  . GERD (gastroesophageal reflux disease) 08/07/2016  . Prediabetes 08/07/2016  . Chronic respiratory failure ([place]) 07/29/2016  . Hypersomnia 07/29/2016  . Arthritis of carpometacarpal [facility]) joint of left thumb 07/09/2016  . Lump of axilla 05/02/2016  . Pneumothorax on left 01/31/2016  . Chronic, continuous use of opioids 09/06/2015  . Degenerative disc disease, lumbar 08/01/2015    Class: Chronic  . Spondylolisthesis of lumbar region 08/01/2015    Class: Chronic  . Urinary, incontinence, stress female 05/06/2014  . Constipation 05/05/2014  . 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 HTN (hypertension) 05/19/2013  . HLD (hyperlipidemia) 05/19/2013    . Depression 05/19/2013  . Hypothyroidism 05/19/2013  . Tobacco abuse   . Osteoarthritis of both knees 11/18/2011  . RLS (restless legs syndrome) 11/18/2011    Current Outpatient Medications on File Prior to Visit  Medication Sig Dispense Refill  . acetaminophen (TYLENOL) 500 MG tablet Take 1,000 mg by mouth every 6 (six) hours as needed for headache.     . albuterol (PROAIR HFA) 108 (90 Base) MCG/ACT inhaler INHALE 1 PUFF INTO THE LUNGS EVERY 6 HOURS AS NEEDED FOR WHEEZING OR SHORTNESS OF BREATH.NEED OFFICE VISIT FOR FURTHER REFILLS 8.5 g 0  . albuterol (VENTOLIN HFA) 108 (90 Base) MCG/ACT inhaler Inhale 2 puffs into the lungs every 6 (six) hours as needed for wheezing or shortness of breath. 18 g 3  . amLODipine (NORVASC) 10 MG tablet Take 1 tablet (10 mg total) by mouth daily. Follow-up appt due in Jan must see provider for future refills 30 tablet 0  . benazepril (LOTENSIN) 40 MG tablet TAKE 1 TABLET (40 MG TOTAL) BY MOUTH DAILY. 90 tablet 1  . clonazePAM (KLONOPIN) 2 MG tablet TAKE 1 TABLET BY MOUTH 2 TIMES A DAY AS NEEDED. 60 tablet 2  . diclofenac (VOLTAREN) 75 MG EC tablet Take 1 tablet (75 mg total) 2 (two) times daily with a meal by mouth. 60 tablet 3  . fentaNYL (DURAGESIC - DOSED MCG/HR) 50 MCG/HR Place 1 patch (50 mcg total) onto the skin every 3 (three) days. 10 patch 0  . gabapentin (NEURONTIN) 300 MG capsule  Take 1 capsule (300 mg total) by mouth 4 (four) times daily. 120 capsule 5  . hydrochlorothiazide (HYDRODIURIL) 12.5 MG tablet Take 1 tablet (12.5 mg total) by mouth daily. 90 tablet 0  . levothyroxine (SYNTHROID, LEVOTHROID) 75 MCG tablet TAKE 1 TABLET (75 MCG TOTAL) BY MOUTH DAILY BEFORE BREAKFAST. 90 tablet 1  . nicotine (NICODERM CQ) 21 mg/24hr patch Place 1 patch (21 mg total) onto the skin daily. 28 patch 1  . omeprazole (PRILOSEC) 20 MG capsule TAKE 1 CAPSULE BY MOUTH 2 TIMES DAILY BEFORE A MEAL. 180 capsule 1  . omeprazole (PRILOSEC) 20 MG capsule TAKE 1 CAPSULE BY  MOUTH 2 TIMES DAILY BEFORE A MEAL. 180 capsule 0  . oxyCODONE-acetaminophen (PERCOCET) 10-325 MG tablet Take 1 tablet by mouth every 6 (six) hours as needed for pain. 120 tablet 0  . pravastatin (PRAVACHOL) 40 MG tablet Take 1 tablet (40 mg total) by mouth daily. 90 tablet 1  . QUEtiapine (SEROQUEL) 100 MG tablet Take 1 tablet (100 mg total) by mouth at bedtime. 30 tablet 2  . rOPINIRole (REQUIP) 0.5 MG tablet TAKE  2 TABLETS AT BEDTIME. 60 tablet 2  . Sennosides (SENNA LAX PO) As directed    . SPIRIVA HANDIHALER 18 MCG inhalation capsule PLACE 1 CAPSULE INTO INHALER AND INHALE ONCE DAILY. 30 capsule 0  . tiZANidine (ZANAFLEX) 4 MG tablet TAKE 1 to 1.5 TABLETS BY MOUTH EVERY 6 HOURS AS NEEDED FOR MUSCLE PAIN/ SPASMS 150 tablet 3  . valACYclovir (VALTREX) 500 MG tablet Take 1 tablet (500 mg total) by mouth daily. 30 tablet 2  . venlafaxine XR (EFFEXOR-XR) 150 MG 24 hr capsule TAKE 2 CAPSULES BY MOUTH DAILY. 60 capsule 0  . Vitamin D, Ergocalciferol, (DRISDOL) 50000 units CAPS capsule Take 1 capsule (50,000 Units total) by mouth every 7 (seven) days. 8 capsule 0   Current Facility-Administered Medications on File Prior to Visit  Medication Dose Route Frequency Provider Last Rate Last Dose  . calcitonin (salmon) (MIACALCIN/FORTICAL) nasal spray 1 spray  1 spray Alternating Nares Daily [name], MD        Past Medical History:  Diagnosis Date  . Active smoker   . Anxiety   . Calcifying tendinitis of shoulder   . Chronic back pain   . Chronic pain syndrome   . COPD (chronic obstructive pulmonary disease) ([place] [address])   . Dysthymic disorder   . Emphysema lung ([employer])   . GERD (gastroesophageal reflux disease)   . Headache    "weekly maybe" (01/31/2016)  . Heart murmur    for years, nothing to be concerned about  . Herpes genitalia   . History of blood transfusion 1980   related to "back surgery"  . Hyperlipidemia   . Hypertension   . Hypothyroidism   . Lumbago   . Osteoarthrosis,  unspecified whether generalized or localized, lower leg   . Pain in joint, upper arm   . Pneumothorax, left 01/31/2016   S/P Left posterior subcostal pain injection on 01/30/2016  . PONV (postoperative nausea and vomiting)    gets nauseous  with longer surgery. Difficuty voiding after surgery  . Postlaminectomy syndrome, thoracic region   . Primary localized osteoarthrosis, lower leg   . Restless leg syndrome   . Sleep apnea    s/p surgery- last sleep study 2011- doesnt use oxygen or machine at night as instructed,.   12/2014- Dr Halford Chessman  reports it is negative.  . Thyroid disease     Past Surgical History:  Procedure Laterality Date  . APPENDECTOMY    . BACK SURGERY     18 back surgeries (2 thoracic & 16 lumbar) (01/31/2016)  . DILATION AND CURETTAGE OF UTERUS    . HAMMER TOE SURGERY    . IR RADIOLOGIST EVAL & MGMT  07/21/2017  . JOINT REPLACEMENT    . KNEE ARTHROSCOPY Right   . LAPAROSCOPIC CHOLECYSTECTOMY    . LUMBAR FUSION N/A 08/01/2015   Procedure: Right sided L1-2 and L2-3 transforaminal lumbar interbody fusion with cages, Extension of posterior fusion T12 to L3, Replaced pedicle screws bilaterally L1-L2 , Replaced left sided pedicle screws L-3. Instrumentation T12 to L3 using local bone graft, Vivigen allograft and cancellous chips;  Surgeon: [name], MD;  Location: [place];  Service: Orthopedics;  Laterality: N/A;  . LUMBAR LAMINECTOMY/DECOMPRESSION MICRODISCECTOMY N/A 01/28/2014   Procedure: Minimally Invasive Right  L1-2 Microdiscectomy;  Surgeon: [name], MD;  Location: [place];  Service: Orthopedics;  Laterality: N/A;  . TOTAL HIP ARTHROPLASTY Right   . TOTAL KNEE ARTHROPLASTY  05/29/2012   Procedure: TOTAL KNEE ARTHROPLASTY;  Surgeon: Mcarthur Rossetti, MD;  Location: [facility];  Service: Orthopedics;  Laterality: Right;  Right Total Knee Arthroplasty  . TUBAL LIGATION    . UVULOPALATOPHARYNGOPLASTY    . VAGINAL HYSTERECTOMY      Social History   Socioeconomic  History  . Marital status: Divorced    Spouse name: n/a  . Number of children: 2  . Years of education: 12+  . Highest education level: None  Social Needs  . Financial resource strain: None  . Food insecurity - worry: None  . Food insecurity - inability: None  . Transportation needs - medical: None  . Transportation needs - non-medical: None  Occupational History  . Occupation: disability    Comment: back surgeries  Tobacco Use  . Smoking status: Current Every Day Smoker    Packs/day: 1.25    Years: 45.00    Pack years: 56.25    Types: Cigarettes  . Smokeless tobacco: Never Used  . Tobacco comment: form given 12/25/16  Substance and Sexual Activity  . Alcohol use: No    Alcohol/week: 0.0 oz  . Drug use: No    Comment: 01/31/2016 "none since ~ 1980"  . Sexual activity: No    Partners: Male  Other Topics Concern  . None  Social History Narrative   Lives alone.  One daughter is local, but is getting ready to move to [place], where her children live with their father.  The other daughter lives near [place], [place].    Family History  Problem Relation Age of Onset  . Kidney disease Mother   . Heart disease Father   . Anuerysm Brother 29       brain  . Heart disease Brother   . Heart disease Sister 47       s/p CABG  . Hypertension Sister   . Colon cancer Neg Hx     Review of Systems  Constitutional: Negative for chills and fever.  Respiratory: Negative for cough, shortness of breath and wheezing.   Cardiovascular: Positive for chest pain (with stress only). Negative for palpitations and leg swelling.  Gastrointestinal: Positive for constipation. Negative for abdominal pain and diarrhea.  Musculoskeletal: Positive for arthralgias and back pain.  Neurological: Positive for headaches. Negative for light-headedness.  Psychiatric/Behavioral: The patient is nervous/anxious.        Objective:   Vitals:   09/05/17 1325  BP: 118/70  Pulse: 97  Resp: 16  Temp: 98.9 F  (37.2 C)  SpO2: 91%   Wt Readings from Last 3 Encounters:  09/05/17 156 lb (70.8 kg)  08/22/17 159 lb (72.1 kg)  06/02/17 159 lb (72.1 kg)   Body mass index is 26.78 kg/m.   Physical Exam    Constitutional: Appears well-developed and well-nourished. No distress.  HENT:  Head: Normocephalic and atraumatic.  Neck: Neck supple. No tracheal deviation present. No thyromegaly present.  No cervical lymphadenopathy Cardiovascular: Normal rate, regular rhythm and normal heart sounds.   No murmur heard. No carotid bruit .  No edema Pulmonary/Chest: Effort normal and breath sounds normal. No respiratory distress. No has no wheezes. No rales.  Skin: Skin is warm and dry. Not diaphoretic.  Psychiatric: Normal mood and affect. Behavior is normal.      Assessment & Plan:    See Problem List for Assessment and Plan of chronic medical problems.

## 2017-09-05 ENCOUNTER — Encounter: Payer: Self-pay | Admitting: Internal Medicine

## 2017-09-05 ENCOUNTER — Other Ambulatory Visit (INDEPENDENT_AMBULATORY_CARE_PROVIDER_SITE_OTHER): Payer: Medicare Other

## 2017-09-05 ENCOUNTER — Ambulatory Visit (INDEPENDENT_AMBULATORY_CARE_PROVIDER_SITE_OTHER): Payer: Medicare Other | Admitting: Internal Medicine

## 2017-09-05 ENCOUNTER — Other Ambulatory Visit: Payer: Self-pay | Admitting: Internal Medicine

## 2017-09-05 VITALS — BP 118/70 | HR 97 | Temp 98.9°F | Resp 16 | Wt 156.0 lb

## 2017-09-05 DIAGNOSIS — R7303 Prediabetes: Secondary | ICD-10-CM

## 2017-09-05 DIAGNOSIS — N183 Chronic kidney disease, stage 3 unspecified: Secondary | ICD-10-CM

## 2017-09-05 DIAGNOSIS — E038 Other specified hypothyroidism: Secondary | ICD-10-CM

## 2017-09-05 DIAGNOSIS — I10 Essential (primary) hypertension: Secondary | ICD-10-CM

## 2017-09-05 DIAGNOSIS — E7849 Other hyperlipidemia: Secondary | ICD-10-CM | POA: Diagnosis not present

## 2017-09-05 DIAGNOSIS — K219 Gastro-esophageal reflux disease without esophagitis: Secondary | ICD-10-CM

## 2017-09-05 LAB — CBC WITH DIFFERENTIAL/PLATELET
Basophils Absolute: 0.1 10*3/uL (ref 0.0–0.1)
Basophils Relative: 0.9 % (ref 0.0–3.0)
Eosinophils Absolute: 0.2 10*3/uL (ref 0.0–0.7)
Eosinophils Relative: 2.4 % (ref 0.0–5.0)
HCT: 38.7 % (ref 36.0–46.0)
Hemoglobin: 12.6 g/dL (ref 12.0–15.0)
Lymphocytes Relative: 33.3 % (ref 12.0–46.0)
Lymphs Abs: 2.5 10*3/uL (ref 0.7–4.0)
MCHC: 32.6 g/dL (ref 30.0–36.0)
MCV: 97.8 fl (ref 78.0–100.0)
Monocytes Absolute: 0.5 10*3/uL (ref 0.1–1.0)
Monocytes Relative: 6.5 % (ref 3.0–12.0)
Neutro Abs: 4.2 10*3/uL (ref 1.4–7.7)
Neutrophils Relative %: 56.9 % (ref 43.0–77.0)
Platelets: 248 10*3/uL (ref 150.0–400.0)
RBC: 3.95 Mil/uL (ref 3.87–5.11)
RDW: 14.4 % (ref 11.5–15.5)
WBC: 7.4 10*3/uL (ref 4.0–10.5)

## 2017-09-05 LAB — COMPREHENSIVE METABOLIC PANEL
ALT: 13 U/L (ref 0–35)
AST: 16 U/L (ref 0–37)
Albumin: 4.5 g/dL (ref 3.5–5.2)
Alkaline Phosphatase: 103 U/L (ref 39–117)
BUN: 24 mg/dL — ABNORMAL HIGH (ref 6–23)
CO2: 34 mEq/L — ABNORMAL HIGH (ref 19–32)
Calcium: 9.4 mg/dL (ref 8.4–10.5)
Chloride: 100 mEq/L (ref 96–112)
Creatinine, Ser: 1.21 mg/dL — ABNORMAL HIGH (ref 0.40–1.20)
GFR: 47.83 mL/min — ABNORMAL LOW (ref >60.00)
Glucose, Bld: 80 mg/dL (ref 70–99)
Potassium: 3.8 mEq/L (ref 3.5–5.1)
Sodium: 141 mEq/L (ref 135–145)
Total Bilirubin: 0.3 mg/dL (ref 0.2–1.2)
Total Protein: 7.4 g/dL (ref 6.0–8.3)

## 2017-09-05 LAB — LIPID PANEL
Cholesterol: 301 mg/dL — ABNORMAL HIGH (ref 0–200)
HDL: 38.9 mg/dL — ABNORMAL LOW (ref >39.00)
Total CHOL/HDL Ratio: 8
Triglycerides: 669 mg/dL — ABNORMAL HIGH (ref 0.0–149.0)

## 2017-09-05 LAB — TSH: TSH: 5.59 u[IU]/mL — ABNORMAL HIGH (ref 0.35–4.50)

## 2017-09-05 LAB — HEMOGLOBIN A1C: Hgb A1c MFr Bld: 6.5 % (ref 4.6–6.5)

## 2017-09-05 LAB — LDL CHOLESTEROL, DIRECT: Direct LDL: 158 mg/dL

## 2017-09-05 MED ORDER — OMEPRAZOLE 40 MG PO CPDR: 40.0000 mg | DELAYED_RELEASE_CAPSULE | Freq: Two times a day (BID) | ORAL | 3 refills | Status: DC

## 2017-09-05 NOTE — Assessment & Plan Note (Signed)
Check a1c Low sugar / carb diet Stressed regular exercise   

## 2017-09-05 NOTE — Patient Instructions (Signed)
  Test(s) ordered today. Your results will be released to [facility] (or called to you) after review, usually within 72hours after test completion. If any changes need to be made, you will be notified at that same time.  Medications reviewed and updated.  Changes include increasing omeprazole 40 mg  Your prescription(s) have been submitted to your pharmacy. Please take as directed and contact our office if you believe you are having problem(s) with the medication(s).  Please followup in 6 months

## 2017-09-05 NOTE — Assessment & Plan Note (Signed)
Check tsh  Titrate med dose if needed  

## 2017-09-05 NOTE — Assessment & Plan Note (Addendum)
Not controlled Increase omeprazole to 40 mg bid

## 2017-09-05 NOTE — Assessment & Plan Note (Signed)
Check lipid panel  Continue daily statin Regular exercise and healthy diet encouraged  

## 2017-09-05 NOTE — Assessment & Plan Note (Signed)
BP well controlled Current regimen effective and well tolerated Continue current medications at current doses  

## 2017-09-05 NOTE — Assessment & Plan Note (Signed)
Trying to drink water throughout day cmp today

## 2017-09-08 ENCOUNTER — Telehealth (INDEPENDENT_AMBULATORY_CARE_PROVIDER_SITE_OTHER): Payer: Self-pay | Admitting: Specialist

## 2017-09-08 ENCOUNTER — Other Ambulatory Visit: Payer: Self-pay | Admitting: Emergency Medicine

## 2017-09-08 DIAGNOSIS — E038 Other specified hypothyroidism: Secondary | ICD-10-CM

## 2017-09-08 MED ORDER — ATORVASTATIN CALCIUM 20 MG PO TABS: 20.0000 mg | ORAL_TABLET | Freq: Every day | ORAL | 3 refills | Status: DC

## 2017-09-08 MED ORDER — LEVOTHYROXINE SODIUM 88 MCG PO TABS: 88.0000 ug | ORAL_TABLET | Freq: Every day | ORAL | 1 refills | Status: DC

## 2017-09-08 NOTE — Telephone Encounter (Signed)
Pt called and needs an updated letter with date for her daughter to continue looking after her and a copy as well

## 2017-09-08 NOTE — Telephone Encounter (Signed)
Please send in Lipitor for pt.

## 2017-09-09 NOTE — Telephone Encounter (Signed)
Pt called and needs an updated letter with date for her daughter to continue looking after her and a copy as well

## 2017-09-10 ENCOUNTER — Encounter: Payer: Self-pay | Admitting: Registered Nurse

## 2017-09-10 ENCOUNTER — Encounter: Payer: Medicare Other | Attending: Physical Medicine & Rehabilitation | Admitting: Registered Nurse

## 2017-09-10 ENCOUNTER — Other Ambulatory Visit: Payer: Self-pay

## 2017-09-10 VITALS — BP 125/87 | HR 77

## 2017-09-10 DIAGNOSIS — M4316 Spondylolisthesis, lumbar region: Secondary | ICD-10-CM | POA: Diagnosis not present

## 2017-09-10 DIAGNOSIS — Z5181 Encounter for therapeutic drug level monitoring: Secondary | ICD-10-CM

## 2017-09-10 DIAGNOSIS — G894 Chronic pain syndrome: Secondary | ICD-10-CM | POA: Insufficient documentation

## 2017-09-10 DIAGNOSIS — Z9981 Dependence on supplemental oxygen: Secondary | ICD-10-CM | POA: Insufficient documentation

## 2017-09-10 DIAGNOSIS — K219 Gastro-esophageal reflux disease without esophagitis: Secondary | ICD-10-CM | POA: Diagnosis not present

## 2017-09-10 DIAGNOSIS — M62830 Muscle spasm of back: Secondary | ICD-10-CM

## 2017-09-10 DIAGNOSIS — M170 Bilateral primary osteoarthritis of knee: Secondary | ICD-10-CM | POA: Insufficient documentation

## 2017-09-10 DIAGNOSIS — Z9889 Other specified postprocedural states: Secondary | ICD-10-CM | POA: Insufficient documentation

## 2017-09-10 DIAGNOSIS — F411 Generalized anxiety disorder: Secondary | ICD-10-CM

## 2017-09-10 DIAGNOSIS — G2581 Restless legs syndrome: Secondary | ICD-10-CM

## 2017-09-10 DIAGNOSIS — J449 Chronic obstructive pulmonary disease, unspecified: Secondary | ICD-10-CM | POA: Diagnosis not present

## 2017-09-10 DIAGNOSIS — M5126 Other intervertebral disc displacement, lumbar region: Secondary | ICD-10-CM | POA: Insufficient documentation

## 2017-09-10 DIAGNOSIS — M5136 Other intervertebral disc degeneration, lumbar region: Secondary | ICD-10-CM

## 2017-09-10 DIAGNOSIS — E039 Hypothyroidism, unspecified: Secondary | ICD-10-CM | POA: Diagnosis not present

## 2017-09-10 DIAGNOSIS — K6289 Other specified diseases of anus and rectum: Secondary | ICD-10-CM

## 2017-09-10 DIAGNOSIS — Z720 Tobacco use: Secondary | ICD-10-CM

## 2017-09-10 DIAGNOSIS — G9619 Other disorders of meninges, not elsewhere classified: Secondary | ICD-10-CM | POA: Insufficient documentation

## 2017-09-10 DIAGNOSIS — M2170 Unequal limb length (acquired), unspecified site: Secondary | ICD-10-CM | POA: Diagnosis not present

## 2017-09-10 DIAGNOSIS — Z79899 Other long term (current) drug therapy: Secondary | ICD-10-CM | POA: Diagnosis not present

## 2017-09-10 DIAGNOSIS — E785 Hyperlipidemia, unspecified: Secondary | ICD-10-CM | POA: Diagnosis not present

## 2017-09-10 DIAGNOSIS — E079 Disorder of thyroid, unspecified: Secondary | ICD-10-CM | POA: Diagnosis not present

## 2017-09-10 DIAGNOSIS — M961 Postlaminectomy syndrome, not elsewhere classified: Secondary | ICD-10-CM

## 2017-09-10 DIAGNOSIS — G8929 Other chronic pain: Secondary | ICD-10-CM

## 2017-09-10 DIAGNOSIS — I10 Essential (primary) hypertension: Secondary | ICD-10-CM | POA: Insufficient documentation

## 2017-09-10 DIAGNOSIS — G4709 Other insomnia: Secondary | ICD-10-CM

## 2017-09-10 MED ORDER — FENTANYL 50 MCG/HR TD PT72: 50.0000 ug | MEDICATED_PATCH | TRANSDERMAL | 0 refills | Status: DC

## 2017-09-10 MED ORDER — OXYCODONE-ACETAMINOPHEN 10-325 MG PO TABS: 1.0000 | ORAL_TABLET | Freq: Four times a day (QID) | ORAL | 0 refills | Status: DC | PRN

## 2017-09-10 NOTE — Progress Notes (Signed)
Subjective:    Patient ID: Jill Phillips, female    DOB: Jan 26, 1956, 62 y.o.   MRN: [id number]  HPI: [name] is a 62 year old female who returns for follow up appointmentfor chronic pain and medication refill. She states her pain is located in her mid back with muscle spasm, lower back and rectal pain pain.She rates her pain 9. Her current exercise regime is walking, also states she is living a sedentary life due to the pain, encouraged to increase activity as tolerated. She verbalizes understanding.    [name] states her rectal pain is constant and the  Fentanyl and Oxycodone offers no relief of her rectal pain. She  has an appointment scheduled with Dr. Lorin Mercy on 09/17/2017 she reports for a possible fusion.  Her analgesic will remain the same at this time and we will continue to monitor, she verbalizes understanding.    [name] Morphine equivalent is 192.00 MME.  She's also prescribed Klonopin. We have reviewed the black box warning again regarding using opioids and benzodiazepines. I highlighted the dangers of using these drugs together and discussed the adverse events including respiratory suppression, overdose, cognitive impairment and importance of  compliance with current regimen. She verbalizes understanding, we will continue to monitor and adjust as indicated.      Daughter in the room.  Oral Swab  was performed on 07/16/2017, it was positive for Fentanyl, see note for details. Oral Swab was performed today.  On 01/23/2017, 11/28/2016 and 08/08/2016  UDS was Performed and Consistent.   Pain Inventory Average Pain 9 Pain Right Now 9 My pain is dull, stabbing and aching  In the last 24 hours, has pain interfered with the following? General activity 10 Relation with others 10 Enjoyment of life 10 What TIME of day is your pain at its worst? all Sleep (in general) Poor  Pain is worse with: walking, bending, inactivity and standing Pain improves with:  heat/ice Relief from Meds: 3  Mobility walk without assistance ability to climb steps?  no do you drive?  yes transfers alone Do you have any goals in this area?  no  Function Do you have any goals in this area?  no  Neuro/Psych bladder control problems numbness trouble walking spasms  Prior Studies Any changes since last visit?  no  Physicians involved in your care Any changes since last visit?  no   Family History  Problem Relation Age of Onset  . Kidney disease Mother   . Heart disease Father   . Anuerysm Brother 29       brain  . Heart disease Brother   . Heart disease Sister 34       s/p CABG  . Hypertension Sister   . Colon cancer Neg Hx    Social History   Socioeconomic History  . Marital status: Divorced    Spouse name: n/a  . Number of children: 2  . Years of education: 12+  . Highest education level: Not on file  Social Needs  . Financial resource strain: Not on file  . Food insecurity - worry: Not on file  . Food insecurity - inability: Not on file  . Transportation needs - medical: Not on file  . Transportation needs - non-medical: Not on file  Occupational History  . Occupation: disability    Comment: back surgeries  Tobacco Use  . Smoking status: Current Every Day Smoker    Packs/day: 1.25    Years: 45.00  Pack years: 56.25    Types: Cigarettes  . Smokeless tobacco: Never Used  . Tobacco comment: form given 12/25/16  Substance and Sexual Activity  . Alcohol use: No    Alcohol/week: 0.0 oz  . Drug use: No    Comment: 01/31/2016 "none since ~ 1980"  . Sexual activity: No    Partners: Male  Other Topics Concern  . Not on file  Social History Narrative   Lives alone.  One daughter is local, but is getting ready to move to [place], where her children live with their father.  The other daughter lives near [place], [place].   Past Surgical History:  Procedure Laterality Date  . APPENDECTOMY    . BACK SURGERY     18 back surgeries (2  thoracic & 16 lumbar) (01/31/2016)  . DILATION AND CURETTAGE OF UTERUS    . HAMMER TOE SURGERY    . IR RADIOLOGIST EVAL & MGMT  07/21/2017  . JOINT REPLACEMENT    . KNEE ARTHROSCOPY Right   . LAPAROSCOPIC CHOLECYSTECTOMY    . LUMBAR FUSION N/A 08/01/2015   Procedure: Right sided L1-2 and L2-3 transforaminal lumbar interbody fusion with cages, Extension of posterior fusion T12 to L3, Replaced pedicle screws bilaterally L1-L2 , Replaced left sided pedicle screws L-3. Instrumentation T12 to L3 using local bone graft, Vivigen allograft and cancellous chips;  Surgeon: [name], MD;  Location: [place];  Service: Orthopedics;  Laterality: N/A;  . LUMBAR LAMINECTOMY/DECOMPRESSION MICRODISCECTOMY N/A 01/28/2014   Procedure: Minimally Invasive Right  L1-2 Microdiscectomy;  Surgeon: [name], MD;  Location: [place];  Service: Orthopedics;  Laterality: N/A;  . TOTAL HIP ARTHROPLASTY Right   . TOTAL KNEE ARTHROPLASTY  05/29/2012   Procedure: TOTAL KNEE ARTHROPLASTY;  Surgeon: Mcarthur Rossetti, MD;  Location: [facility];  Service: Orthopedics;  Laterality: Right;  Right Total Knee Arthroplasty  . TUBAL LIGATION    . UVULOPALATOPHARYNGOPLASTY    . VAGINAL HYSTERECTOMY     Past Medical History:  Diagnosis Date  . Active smoker   . Anxiety   . Calcifying tendinitis of shoulder   . Chronic back pain   . Chronic pain syndrome   . COPD (chronic obstructive pulmonary disease) ([place])   . Dysthymic disorder   . Emphysema lung ([place])   . GERD (gastroesophageal reflux disease)   . Headache    "weekly maybe" (01/31/2016)  . Heart murmur    for years, nothing to be concerned about  . Herpes genitalia   . History of blood transfusion 1980   related to "back surgery"  . Hyperlipidemia   . Hypertension   . Hypothyroidism   . Lumbago   . Osteoarthrosis, unspecified whether generalized or localized, lower leg   . Pain in joint, upper arm   . Pneumothorax, left 01/31/2016   S/P Left posterior subcostal  pain injection on 01/30/2016  . PONV (postoperative nausea and vomiting)    gets nauseous  with longer surgery. Difficuty voiding after surgery  . Postlaminectomy syndrome, thoracic region   . Primary localized osteoarthrosis, lower leg   . Restless leg syndrome   . Sleep apnea    s/p surgery- last sleep study 2011- doesnt use oxygen or machine at night as instructed,.   12/2014- Dr Halford Chessman  reports it is negative.  . Thyroid disease    There were no vitals taken for this visit.  Opioid Risk Score:  6 Fall Risk Score:  `1  Depression screen PHQ 2/9  Depression  screen [facility] 2/9 09/10/2017 06/13/2017 05/15/2016 05/02/2016 12/07/2015 11/15/2015 10/13/2015  Decreased Interest 1 0 0 0 0 0 0  Down, Depressed, Hopeless 1 0 0 0 0 0 -  PHQ - 2 Score 2 0 0 0 0 0 0  Some recent data might be hidden      Review of Systems  Constitutional: Negative.   HENT: Negative.   Eyes: Negative.   Respiratory: Positive for shortness of breath.   Cardiovascular: Negative.   Gastrointestinal: Negative.   Endocrine: Negative.   Genitourinary: Negative.   Musculoskeletal: Negative.   Skin: Negative.   Allergic/Immunologic: Negative.   Neurological: Negative.   Hematological: Negative.   Psychiatric/Behavioral: Negative.   All other systems reviewed and are negative.      Objective:   Physical Exam  Constitutional: She is oriented to person, place, and time. She appears well-developed and well-nourished.  HENT:  Head: Normocephalic and atraumatic.  Neck: Normal range of motion. Neck supple.  Cardiovascular: Normal rate and regular rhythm.  Pulmonary/Chest: Effort normal and breath sounds normal.  Musculoskeletal:  Normal Muscle Bulk and Muscle Testing Reveals: Upper Extremities: Full ROM and Muscle Strength 5/5 Thoracic Paraspinal Tenderness: T-1-T-3 Mainly Left Side Lumbar Paraspinal Tenderness: L-3-L-5 Left Greater Trochanter T[place]renss Lower Extremities: Full ROM and Muscle Strength 5/5 Arises  from Table with ease Narrow Based gait   Neurological: She is alert and oriented to person, place, and time.  Skin: Skin is warm and dry.  Psychiatric: She has a normal mood and affect.  Nursing note and vitals reviewed.         Assessment & Plan:  1. Chronic lumbar spine pain/post-lami syndrome: 09/10/2017 Refilled: Fentanyl 50 MCG one patch every three days #10 and Oxycodone 10/325 mg one tablet every 6 hours as needed for pain #120. We will continue the opioid monitoring program, this consists of regular clinic visits, examinations, urine drug screen, pill counts as well as use of [place] Controlled Substance Reporting System. 2. Chronic Thoracic Pain: :Continue current medication regime and Continue to Monitor.09/10/17 3. Osteoarthritis of left knee: No complaints today. S/P Cortisone Injection on 05/12/2017 by Dr. [name]. Continue with Heat, exercise and voltaren gel. 09/10/2017 4. Rotator cuff syndrome/subacromial bursitis: No complaints voiced today. Continue to Monitor. 09/10/2017. 5. Restless legs syndrome: Continue Requip.Continue to monitor. 09/10/2017 6. Tobacco Abuse: Continue Smoking Cessation: PCP prescribed Nicotine Patches : 09/10/2017 7. Muscle Spasm: Continue Tizanidine. 09/10/2017 8. Insomnia: Continue Seroquel. 09/10/2017 9. Neuropathic Pain: Continue Gabapentin.09/10/2017 10.Marland KitchenAnxiety: Continue Klonopin. 09/10/2017 11.. Chronic Rectal Pain: Has a scheduled appointment with Dr. Lorin Mercy 09/17/2017.  20 minutes of face to face patient care time was spent during this visit. All questions were encouraged and answered.  F/u in 1 month

## 2017-09-11 NOTE — Telephone Encounter (Signed)
Patient called back and wanted to know if you could change the date from the other letter to the current date and mail it to her.  Patient would like for you to call her at [phone].  Thank you.

## 2017-09-12 NOTE — Telephone Encounter (Signed)
I called patient and advised that we can not change the date on the last letter due to the computer system stamps it with the date and that Dr. [name] will have to do a new letter and that once he has done this I would call her back to let her know.

## 2017-09-15 LAB — DRUG TOX MONITOR 1 W/CONF, ORAL FLD
Amobarbital: NEGATIVE ng/mL (ref <10)
Amphetamines: NEGATIVE ng/mL (ref <10)
Barbiturates: POSITIVE ng/mL — AB (ref <10)
Benzodiazepines: NEGATIVE ng/mL (ref <0.50)
Buprenorphine: NEGATIVE ng/mL (ref <0.10)
Butalbital: NEGATIVE ng/mL (ref <10)
Cocaine: NEGATIVE ng/mL (ref <5.0)
Codeine: NEGATIVE ng/mL (ref <2.5)
Cotinine: 97.4 ng/mL — ABNORMAL HIGH (ref <5.0)
Dihydrocodeine: NEGATIVE ng/mL (ref <2.5)
Fentanyl: 4.04 ng/mL — ABNORMAL HIGH (ref <0.10)
Fentanyl: POSITIVE ng/mL — AB (ref <0.10)
Heroin Metabolite: NEGATIVE ng/mL (ref <1.0)
Hydrocodone: NEGATIVE ng/mL (ref <2.5)
Hydromorphone: NEGATIVE ng/mL (ref <2.5)
MARIJUANA: NEGATIVE ng/mL (ref <2.5)
MDMA: NEGATIVE ng/mL (ref <10)
Meprobamate: NEGATIVE ng/mL (ref <2.5)
Methadone: NEGATIVE ng/mL (ref <5.0)
Morphine: NEGATIVE ng/mL (ref <2.5)
Nicotine Metabolite: POSITIVE ng/mL — AB (ref <5.0)
Norhydrocodone: NEGATIVE ng/mL (ref <2.5)
Noroxycodone: 9.2 ng/mL — ABNORMAL HIGH (ref <2.5)
Opiates: POSITIVE ng/mL — AB (ref <2.5)
Oxycodone: 27.7 ng/mL — ABNORMAL HIGH (ref <2.5)
Oxymorphone: NEGATIVE ng/mL (ref <2.5)
Pentobarbital: NEGATIVE ng/mL (ref <10)
Phencyclidine: NEGATIVE ng/mL (ref <10)
Phenobarbital: 15 ng/mL — ABNORMAL HIGH (ref <10)
Secobarbital: NEGATIVE ng/mL (ref <10)
Tapentadol: NEGATIVE ng/mL (ref <5.0)
Zolpidem: NEGATIVE ng/mL (ref <5.0)

## 2017-09-15 LAB — DRUG TOX ALC METAB W/CON, ORAL FLD: Alcohol Metabolite: NEGATIVE ng/mL (ref <25)

## 2017-09-16 ENCOUNTER — Telehealth: Payer: Self-pay | Admitting: *Deleted

## 2017-09-16 NOTE — Telephone Encounter (Signed)
Oral swab drug screen was consistent for prescribed medications oxycodone and fentanyl.  There is presence of barbiturate  Phenobarbital, which is not prescribed according to PMP aware. Jill Phillips is going to call and ask [name] if she has taken anything with the barbiturate in it.

## 2017-09-17 ENCOUNTER — Encounter (INDEPENDENT_AMBULATORY_CARE_PROVIDER_SITE_OTHER): Payer: Self-pay | Admitting: Orthopaedic Surgery

## 2017-09-17 ENCOUNTER — Ambulatory Visit (INDEPENDENT_AMBULATORY_CARE_PROVIDER_SITE_OTHER): Payer: Medicare Other | Admitting: Orthopaedic Surgery

## 2017-09-17 ENCOUNTER — Encounter (INDEPENDENT_AMBULATORY_CARE_PROVIDER_SITE_OTHER): Payer: Self-pay | Admitting: Specialist

## 2017-09-17 ENCOUNTER — Encounter (INDEPENDENT_AMBULATORY_CARE_PROVIDER_SITE_OTHER): Payer: Self-pay | Admitting: Radiology

## 2017-09-17 ENCOUNTER — Telehealth (INDEPENDENT_AMBULATORY_CARE_PROVIDER_SITE_OTHER): Payer: Self-pay | Admitting: Specialist

## 2017-09-17 DIAGNOSIS — M533 Sacrococcygeal disorders, not elsewhere classified: Secondary | ICD-10-CM | POA: Diagnosis not present

## 2017-09-17 NOTE — Telephone Encounter (Signed)
Letter completed. jen

## 2017-09-17 NOTE — Telephone Encounter (Signed)
Patient has appointment today with Dr. Lorin Mercy and would like the letter stating that her daughter is her care taker [employer]) if it could be picked up today if at all possible.

## 2017-09-17 NOTE — Telephone Encounter (Signed)
Pt is aware her letter is ready for pick up

## 2017-09-17 NOTE — Progress Notes (Signed)
Office Visit Note   Patient: Jill Phillips           Date of Birth: 1956/08/28           MRN: [id number] Visit Date: 09/17/2017              Requested by: [name], MD [place], [place] [id number] PCP: [name], MD   Assessment & Plan: Visit Diagnoses:  1. Sacral pain     Plan: Patient had findings consistent with a sacral insufficiency fracture on the right side pelvic MRI done in June 2018.  The area did not reveal changes in this region on CT of the pelvis on 09/05/2017.  She may have healed the insufficiency fracture.  He does have some sacral degenerative changes present are slightly worse on the right than left side adjacent to where bone graft was harvested previously.  No evidence of loosening of her hemiarthroplasty on radiographs.  Patient discussed that she was told by Dr. Carolynn Comment that she had multiple tailbone fractures.  I reviewed images with her including the MRI of her pelvis from June 2018 CT scan 09/04/2017 of the pelvis, hip MRIs, plain radiographs of her lumbar spine.  There is no evidence that she has coccyx fractures and I discussed with her I am not sure that she would get improvement fusion of the sacroiliac joints with her symptoms of pain at the anus.  I offered her referral for injection under fluoroscopy for sacroiliac joints to see if this gave her any improvement in her symptoms for both diagnostic and therapeutic reasons and she states she does not want to have this done.  States she does not know why she was told she had tailbone fractures when she does not have tailbone fractures states she wants to go back to discuss this further with Dr. [name].  A delayed bone scan for evaluation of sacrum the option.  Appointment to see Dr. [name] discuss treatment options further.   Follow-Up Instructions: Return if symptoms worsen or fail to improve.   Orders:  No orders of the defined types were placed in this encounter.  No orders of the defined types  were placed in this encounter.     Procedures: No procedures performed   Clinical Data: No additional findings.   Subjective: Chief Complaint  Patient presents with  . Lower Back - Pain    HPI 62 year old female with complaints of burning pain located at the rectum at the midline inferior to the sacrum.  MRI showed evidence of sacral insufficiency fracture of the sacral ala June 2018 by MRI scan.  She states she has had 17 lumbar spine procedures and is instrumented from T11-L4.  Remote interbody fusion L4-5 and L5-S1.  Osteopenia with previous right femoral hemiarthroplasty for femoral neck fracture.  He had right posterior iliac crest bone graft previously for her spine fusions.  A pelvic CT scan performed on 09/05/2017 showed no well-defined fracture or presacral edema.  SI joint slightly worse on the right than left and most of this is posterior to the SI joint adjacent to the bone graft harvest site.  Some calcification along the anterior dura at L4-5 is noted on the scan satisfactory canal decompression and solid fusion.  Patient describes constant pain and has to use ice times a day.  States her insurance would not approve the sacral plasty.  There is a Duragesic 50 mcg/h patch 3 days and Percocet 10 tablets up to  4 a day as well as Neurontin 300 mg 4 times a day for her symptoms.  CT pelvis did show rigid gas phenomenon in both sacroiliac joints.  Patient is a continued smoker.  Review of Systems 14 point review of systems positive for urinary stress incontinence, chronic continuous use of opioids, restless leg syndrome, hypertension, hyperlipidemia, depression, previous multilevel spine fusion, prediabetes COPD, restless leg syndrome, scoliosis or myalgias, chronic back pain.  Positive for depression.  Otherwise negative as it pertains HPI.   Objective: Vital Signs: BP 129/81   Pulse (!) 108   Ht 5' 3.5" (1.613 m)   Wt 156 lb (70.8 kg)   BMI 27.20 kg/m   Physical Exam    Constitutional: She is oriented to person, place, and time. She appears well-developed and well-nourished.  HENT:  Head: Normocephalic.  Eyes: Conjunctivae are normal. Pupils are equal, round, and reactive to light.  Neck: Normal range of motion.  Cardiovascular: Normal rate.  Pulmonary/Chest: Effort normal. No respiratory distress. She has no wheezes.  Neurological: She is alert and oriented to person, place, and time.  Skin: Skin is warm and dry. Capillary refill takes less than 2 seconds.  Psychiatric: She has a normal mood and affect. Her behavior is normal.    Ortho Exam well-healed midline incisions extending from the lower thoracic region to the sacrum.  The right posterior iliac crest.  Complains of some discomfort with hip log roll.  Faber test in supine position.  Distal pulses are intact.  Quads hamstrings are strong.  Negative straight leg raising 90 degrees.  Decreased knee and ankle jerk.  No clonus.  Normal capillary refill.  No lower extremity edema.  She has pelvic obliquity with some scoliosis.  The standing position in the left hemipelvis 1/2-2 cm higher than the right.  Specialty Comments:  No specialty comments available.  Imaging: No results found.   PMFS History: Patient Active Problem List   Diagnosis Date Noted  . Sacral pain 09/17/2017  . COPD (chronic obstructive pulmonary disease) ([place]) 04/21/2017  . Lower back pain 03/03/2017  . CKD (chronic kidney disease) stage 3, GFR 30-59 ml/min (HCC) 08/07/2016  . GERD (gastroesophageal reflux disease) 08/07/2016  . Prediabetes 08/07/2016  . Chronic respiratory failure ([place]) 07/29/2016  . Hypersomnia 07/29/2016  . Arthritis of carpometacarpal [facility]) joint of left thumb 07/09/2016  . Lump of axilla 05/02/2016  . Pneumothorax on left 01/31/2016  . Chronic, continuous use of opioids 09/06/2015  . Degenerative disc disease, lumbar 08/01/2015    Class: Chronic  . Spondylolisthesis of lumbar region 08/01/2015     Class: Chronic  . Urinary, incontinence, stress female 05/06/2014  . Constipation 05/05/2014  . 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 HTN (hypertension) 05/19/2013  . HLD (hyperlipidemia) 05/19/2013  . Depression 05/19/2013  . Hypothyroidism 05/19/2013  . Tobacco abuse   . Osteoarthritis of both knees 11/18/2011  . RLS (restless legs syndrome) 11/18/2011   Past Medical History:  Diagnosis Date  . Active smoker   . Anxiety   . Calcifying tendinitis of shoulder   . Chronic back pain   . Chronic pain syndrome   . COPD (chronic obstructive pulmonary disease) ([place])   . Dysthymic disorder   . Emphysema lung ([place])   . GERD (gastroesophageal reflux disease)   . Headache    "weekly maybe" (01/31/2016)  . Heart murmur    for years, nothing to be concerned about  . Herpes genitalia   . History of blood transfusion 1980  related to "back surgery"  . Hyperlipidemia   . Hypertension   . Hypothyroidism   . Lumbago   . Osteoarthrosis, unspecified whether generalized or localized, lower leg   . Pain in joint, upper arm   . Pneumothorax, left 01/31/2016   S/P Left posterior subcostal pain injection on 01/30/2016  . PONV (postoperative nausea and vomiting)    gets nauseous  with longer surgery. Difficuty voiding after surgery  . Postlaminectomy syndrome, thoracic region   . Primary localized osteoarthrosis, lower leg   . Restless leg syndrome   . Sleep apnea    s/p surgery- last sleep study 2011- doesnt use oxygen or machine at night as instructed,.   12/2014- Dr Halford Chessman  reports it is negative.  . Thyroid disease     Family History  Problem Relation Age of Onset  . Kidney disease Mother   . Heart disease Father   . Anuerysm Brother 29       brain  . Heart disease Brother   . Heart disease Sister 68       s/p CABG  . Hypertension Sister   . Colon cancer Neg Hx     Past Surgical History:  Procedure Laterality Date  . APPENDECTOMY    . BACK SURGERY     18 back surgeries (2 thoracic & 16  lumbar) (01/31/2016)  . DILATION AND CURETTAGE OF UTERUS    . HAMMER TOE SURGERY    . IR RADIOLOGIST EVAL & MGMT  07/21/2017  . JOINT REPLACEMENT    . KNEE ARTHROSCOPY Right   . LAPAROSCOPIC CHOLECYSTECTOMY    . LUMBAR FUSION N/A 08/01/2015   Procedure: Right sided L1-2 and L2-3 transforaminal lumbar interbody fusion with cages, Extension of posterior fusion T12 to L3, Replaced pedicle screws bilaterally L1-L2 , Replaced left sided pedicle screws L-3. Instrumentation T12 to L3 using local bone graft, Vivigen allograft and cancellous chips;  Surgeon: [name], MD;  Location: [facility];  Service: Orthopedics;  Laterality: N/A;  . LUMBAR LAMINECTOMY/DECOMPRESSION MICRODISCECTOMY N/A 01/28/2014   Procedure: Minimally Invasive Right  L1-2 Microdiscectomy;  Surgeon: [name], MD;  Location: [facility];  Service: Orthopedics;  Laterality: N/A;  . TOTAL HIP ARTHROPLASTY Right   . TOTAL KNEE ARTHROPLASTY  05/29/2012   Procedure: TOTAL KNEE ARTHROPLASTY;  Surgeon: Mcarthur Rossetti, MD;  Location: [facility];  Service: Orthopedics;  Laterality: Right;  Right Total Knee Arthroplasty  . TUBAL LIGATION    . UVULOPALATOPHARYNGOPLASTY    . VAGINAL HYSTERECTOMY     Social History   Occupational History  . Occupation: disability    Comment: back surgeries  Tobacco Use  . Smoking status: Current Every Day Smoker    Packs/day: 1.25    Years: 45.00    Pack years: 56.25    Types: Cigarettes  . Smokeless tobacco: Never Used  . Tobacco comment: form given 12/25/16  Substance and Sexual Activity  . Alcohol use: No    Alcohol/week: 0.0 oz  . Drug use: No    Comment: 01/31/2016 "none since ~ 1980"  . Sexual activity: No    Partners: Male

## 2017-09-17 NOTE — Telephone Encounter (Signed)
Patient states she still having severe rectal pain and wants to see Dr [name] again or have him refer her to another physician for her symptoms. She states she  was released PRN by Dr Lorin Mercy after her visit today 09/17/17. Please advise and call patient

## 2017-09-18 ENCOUNTER — Other Ambulatory Visit: Payer: Self-pay | Admitting: Internal Medicine

## 2017-09-18 NOTE — Telephone Encounter (Signed)
Jill Phillips called and spoke with Jill Phillips and she denies taking phenobarbital or anything that may contain the medication.  She has taken no one elses medication.  I will reach out to toxicologist and see if they can explain the presence of phenobarb in the swab.[name]

## 2017-09-18 NOTE — Telephone Encounter (Addendum)
I spoke with [facility] @ [facility] toxicology and she asked if she was taking primadone or mysoline as both will cause a positive phenobarb level.Jill Phillips has been prescribed primadone in the past and this may explain the presence of very low level phenobarbital (cut-off was 10 and her level was confirmed at 15).

## 2017-09-20 ENCOUNTER — Other Ambulatory Visit: Payer: Self-pay | Admitting: Registered Nurse

## 2017-09-29 ENCOUNTER — Other Ambulatory Visit: Payer: Self-pay | Admitting: Physical Medicine & Rehabilitation

## 2017-10-09 ENCOUNTER — Emergency Department (HOSPITAL_COMMUNITY)
Admission: EM | Admit: 2017-10-09 | Discharge: 2017-10-09 | Disposition: A | Discharge status: Home / Self Care | Payer: Medicare Other | Attending: Emergency Medicine | Admitting: Emergency Medicine

## 2017-10-09 ENCOUNTER — Encounter (HOSPITAL_COMMUNITY): Payer: Self-pay | Admitting: Emergency Medicine

## 2017-10-09 ENCOUNTER — Other Ambulatory Visit: Payer: Self-pay

## 2017-10-09 DIAGNOSIS — N183 Chronic kidney disease, stage 3 (moderate): Secondary | ICD-10-CM | POA: Diagnosis not present

## 2017-10-09 DIAGNOSIS — N3000 Acute cystitis without hematuria: Secondary | ICD-10-CM | POA: Insufficient documentation

## 2017-10-09 DIAGNOSIS — J449 Chronic obstructive pulmonary disease, unspecified: Secondary | ICD-10-CM | POA: Diagnosis not present

## 2017-10-09 DIAGNOSIS — Z79899 Other long term (current) drug therapy: Secondary | ICD-10-CM | POA: Diagnosis not present

## 2017-10-09 DIAGNOSIS — Z96651 Presence of right artificial knee joint: Secondary | ICD-10-CM | POA: Insufficient documentation

## 2017-10-09 DIAGNOSIS — Z96641 Presence of right artificial hip joint: Secondary | ICD-10-CM | POA: Diagnosis not present

## 2017-10-09 DIAGNOSIS — F17210 Nicotine dependence, cigarettes, uncomplicated: Secondary | ICD-10-CM | POA: Insufficient documentation

## 2017-10-09 DIAGNOSIS — I129 Hypertensive chronic kidney disease with stage 1 through stage 4 chronic kidney disease, or unspecified chronic kidney disease: Secondary | ICD-10-CM | POA: Insufficient documentation

## 2017-10-09 DIAGNOSIS — E039 Hypothyroidism, unspecified: Secondary | ICD-10-CM | POA: Diagnosis not present

## 2017-10-09 DIAGNOSIS — R300 Dysuria: Secondary | ICD-10-CM | POA: Diagnosis present

## 2017-10-09 LAB — URINALYSIS, ROUTINE W REFLEX MICROSCOPIC
Bacteria, UA: NONE SEEN
Bilirubin Urine: NEGATIVE
Glucose, UA: NEGATIVE mg/dL
Hgb urine dipstick: NEGATIVE
Ketones, ur: NEGATIVE mg/dL
Leukocytes, UA: NEGATIVE
Nitrite: POSITIVE — AB
Protein, ur: 30 mg/dL — AB
Specific Gravity, Urine: 1.03 (ref 1.005–1.030)
pH: 5 (ref 5.0–8.0)

## 2017-10-09 LAB — CBC
HCT: 39.2 % (ref 36.0–46.0)
Hemoglobin: 12.7 g/dL (ref 12.0–15.0)
MCH: 31.8 pg (ref 26.0–34.0)
MCHC: 32.4 g/dL (ref 30.0–36.0)
MCV: 98 fL (ref 78.0–100.0)
Platelets: 285 10*3/uL (ref 150–400)
RBC: 4 MIL/uL (ref 3.87–5.11)
RDW: 13.7 % (ref 11.5–15.5)
WBC: 8 10*3/uL (ref 4.0–10.5)

## 2017-10-09 LAB — COMPREHENSIVE METABOLIC PANEL
ALT: 14 U/L (ref 14–54)
AST: 22 U/L (ref 15–41)
Albumin: 4 g/dL (ref 3.5–5.0)
Alkaline Phosphatase: 103 U/L (ref 38–126)
Anion gap: 12 (ref 5–15)
BUN: 18 mg/dL (ref 6–20)
CO2: 26 mmol/L (ref 22–32)
Calcium: 9.3 mg/dL (ref 8.9–10.3)
Chloride: 104 mmol/L (ref 101–111)
Creatinine, Ser: 1.09 mg/dL — ABNORMAL HIGH (ref 0.44–1.00)
GFR calc Af Amer: >60 mL/min (ref >60)
GFR calc non Af Amer: 53 mL/min — ABNORMAL LOW (ref >60)
Glucose, Bld: 118 mg/dL — ABNORMAL HIGH (ref 65–99)
Potassium: 3.6 mmol/L (ref 3.5–5.1)
Sodium: 142 mmol/L (ref 135–145)
Total Bilirubin: 0.3 mg/dL (ref 0.3–1.2)
Total Protein: 7 g/dL (ref 6.5–8.1)

## 2017-10-09 LAB — LIPASE, BLOOD: Lipase: 32 U/L (ref 11–51)

## 2017-10-09 MED ORDER — LIDOCAINE HCL (PF) 1 % IJ SOLN
INTRAMUSCULAR | Status: AC
  Administered 2017-10-09: 21:00:00
  Filled 2017-10-09: qty 5

## 2017-10-09 MED ORDER — FLUCONAZOLE 150 MG PO TABS: 150.0000 mg | ORAL_TABLET | Freq: Every day | ORAL | 0 refills | Status: AC

## 2017-10-09 MED ORDER — CEPHALEXIN 500 MG PO CAPS: 500.0000 mg | ORAL_CAPSULE | Freq: Two times a day (BID) | ORAL | 0 refills | Status: AC

## 2017-10-09 MED ORDER — CEFTRIAXONE SODIUM 250 MG IJ SOLR
250.0000 mg | Freq: Once | INTRAMUSCULAR | Status: AC
  Administered 2017-10-09: 250 mg via INTRAMUSCULAR
  Filled 2017-10-09: qty 250

## 2017-10-09 NOTE — ED Provider Notes (Signed)
[place] EMERGENCY DEPARTMENT Provider Note   CSN: [id number] Arrival date & time: 10/09/17  1403     History   Chief Complaint Chief Complaint  Patient presents with  . Abdominal Pain  . Dysuria    HPI Jill Phillips is a 62 y.o. female presenting for evaluation of abdominal pain and dysuria.  Patient states that for the past 8 days, she has had lower abdominal pain and dysuria.  The pain is constant, described as a presure.  It is not worse with urination.  She reports associated dysuria and urinary frequency.  This is not improved with AZO or 3 doxycycline that her daughter gave her yesterday.  Nothing makes the pain better or worse. She denies fevers, chills, chest pain, shortness of breath, nausea, vomiting, upper abdominal pain, or abnormal bowel movements.  She denies vaginal discharge.  She states she is not sexually active.  She states she has frequent UTIs, and this feels typical for her.  Patient states she feels better quicker when given a shot in the ER prior to starting oral pills.   HPI  Past Medical History:  Diagnosis Date  . Active smoker   . Anxiety   . Calcifying tendinitis of shoulder   . Chronic back pain   . Chronic pain syndrome   . COPD (chronic obstructive pulmonary disease) ([place])   . Dysthymic disorder   . Emphysema lung ([place])   . GERD (gastroesophageal reflux disease)   . Headache    "weekly maybe" (01/31/2016)  . Heart murmur    for years, nothing to be concerned about  . Herpes genitalia   . History of blood transfusion 1980   related to "back surgery"  . Hyperlipidemia   . Hypertension   . Hypothyroidism   . Lumbago   . Osteoarthrosis, unspecified whether generalized or localized, lower leg   . Pain in joint, upper arm   . Pneumothorax, left 01/31/2016   S/P Left posterior subcostal pain injection on 01/30/2016  . PONV (postoperative nausea and vomiting)    gets nauseous  with longer surgery. Difficuty voiding after  surgery  . Postlaminectomy syndrome, thoracic region   . Primary localized osteoarthrosis, lower leg   . Restless leg syndrome   . Sleep apnea    s/p surgery- last sleep study 2011- doesnt use oxygen or machine at night as instructed,.   12/2014- Dr Halford Chessman  reports it is negative.  . Thyroid disease     Patient Active Problem List   Diagnosis Date Noted  . Sacral pain 09/17/2017  . COPD (chronic obstructive pulmonary disease) ([place]) 04/21/2017  . Lower back pain 03/03/2017  . CKD (chronic kidney disease) stage 3, GFR 30-59 ml/min (HCC) 08/07/2016  . GERD (gastroesophageal reflux disease) 08/07/2016  . Prediabetes 08/07/2016  . Chronic respiratory failure ([place]) 07/29/2016  . Hypersomnia 07/29/2016  . Arthritis of carpometacarpal [facility]) joint of left thumb 07/09/2016  . Lump of axilla 05/02/2016  . Pneumothorax on left 01/31/2016  . Chronic, continuous use of opioids 09/06/2015  . Degenerative disc disease, lumbar 08/01/2015    Class: Chronic  . Spondylolisthesis of lumbar region 08/01/2015    Class: Chronic  . Urinary, incontinence, stress female 05/06/2014  . Constipation 05/05/2014  . 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 HTN (hypertension) 05/19/2013  . HLD (hyperlipidemia) 05/19/2013  . Depression 05/19/2013  . Hypothyroidism 05/19/2013  . Tobacco abuse   . Osteoarthritis of both knees 11/18/2011  . RLS (restless legs syndrome) 11/18/2011  Past Surgical History:  Procedure Laterality Date  . APPENDECTOMY    . BACK SURGERY     18 back surgeries (2 thoracic & 16 lumbar) (01/31/2016)  . DILATION AND CURETTAGE OF UTERUS    . HAMMER TOE SURGERY    . IR RADIOLOGIST EVAL & MGMT  07/21/2017  . JOINT REPLACEMENT    . KNEE ARTHROSCOPY Right   . LAPAROSCOPIC CHOLECYSTECTOMY    . LUMBAR FUSION N/A 08/01/2015   Procedure: Right sided L1-2 and L2-3 transforaminal lumbar interbody fusion with cages, Extension of posterior fusion T12 to L3, Replaced pedicle screws bilaterally L1-L2 , Replaced  left sided pedicle screws L-3. Instrumentation T12 to L3 using local bone graft, Vivigen allograft and cancellous chips;  Surgeon: [name], MD;  Location: [place];  Service: Orthopedics;  Laterality: N/A;  . LUMBAR LAMINECTOMY/DECOMPRESSION MICRODISCECTOMY N/A 01/28/2014   Procedure: Minimally Invasive Right  L1-2 Microdiscectomy;  Surgeon: [name], MD;  Location: [facility];  Service: Orthopedics;  Laterality: N/A;  . TOTAL HIP ARTHROPLASTY Right   . TOTAL KNEE ARTHROPLASTY  05/29/2012   Procedure: TOTAL KNEE ARTHROPLASTY;  Surgeon: Mcarthur Rossetti, MD;  Location: [facility];  Service: Orthopedics;  Laterality: Right;  Right Total Knee Arthroplasty  . TUBAL LIGATION    . UVULOPALATOPHARYNGOPLASTY    . VAGINAL HYSTERECTOMY      OB History    No data available       Home Medications    Prior to Admission medications   Medication Sig Start Date End Date Taking? Authorizing Provider  acetaminophen (TYLENOL) 500 MG tablet Take 1,000 mg by mouth every 6 (six) hours as needed for headache.     [provider]  albuterol (PROAIR HFA) 108 (90 Base) MCG/ACT inhaler INHALE 1 PUFF INTO THE LUNGS EVERY 6 HOURS AS NEEDED FOR WHEEZING OR SHORTNESS OF BREATH.NEED OFFICE VISIT FOR FURTHER REFILLS 09/05/16   Nche, Charlene Brooke, NP  albuterol (VENTOLIN HFA) 108 (90 Base) MCG/ACT inhaler Inhale 2 puffs into the lungs every 6 (six) hours as needed for wheezing or shortness of breath. 05/09/17   Parrett, Fonnie Mu, NP  amLODipine (NORVASC) 10 MG tablet Take 1 tablet (10 mg total) by mouth daily. Follow-up appt due in Jan must see provider for future refills 08/27/17   [name], MD  atorvastatin (LIPITOR) 20 MG tablet Take 1 tablet (20 mg total) by mouth daily. 09/08/17   Burns, Claudina Lick, MD  benazepril (LOTENSIN) 40 MG tablet TAKE 1 TABLET (40 MG TOTAL) BY MOUTH DAILY. 04/16/17   [name], MD  cephALEXin (KEFLEX) 500 MG capsule Take 1 capsule (500 mg total) by mouth 2 (two) times daily for 7  days. 10/09/17 10/16/17  Suzi Hernan, PA-C  clonazePAM (KLONOPIN) 2 MG tablet TAKE 1 TABLET BY MOUTH 2 TIMES A DAY AS NEEDED. 09/23/17   Meredith Staggers, MD  diclofenac (VOLTAREN) 75 MG EC tablet Take 1 tablet (75 mg total) 2 (two) times daily with a meal by mouth. 07/16/17   Meredith Staggers, MD  fentaNYL (DURAGESIC - DOSED MCG/HR) 50 MCG/HR Place 1 patch (50 mcg total) onto the skin every 3 (three) days. 09/10/17   Bayard Hugger, NP  fluconazole (DIFLUCAN) 150 MG tablet Take 1 tablet (150 mg total) by mouth daily for 1 day. After finishing antibiotics 10/09/17 10/10/17  Dametri Ozburn, PA-C  gabapentin (NEURONTIN) 300 MG capsule Take 1 capsule (300 mg total) by mouth 4 (four) times daily. 08/14/17  Bayard Hugger, NP  hydrochlorothiazide (HYDRODIURIL) 12.5 MG tablet Take 1 tablet (12.5 mg total) by mouth daily. 08/19/17   [name], MD  levothyroxine (SYNTHROID, LEVOTHROID) 88 MCG tablet Take 1 tablet (88 mcg total) by mouth daily. 09/08/17   [name], MD  nicotine (NICODERM CQ) 21 mg/24hr patch Place 1 patch (21 mg total) onto the skin daily. 05/07/17   [name], MD  omeprazole (PRILOSEC) 40 MG capsule Take 1 capsule (40 mg total) by mouth 2 (two) times daily before a meal. 09/05/17   Burns, Claudina Lick, MD  oxyCODONE-acetaminophen (PERCOCET) 10-325 MG tablet Take 1 tablet by mouth every 6 (six) hours as needed for pain. 09/10/17   Bayard Hugger, NP  QUEtiapine (SEROQUEL) 100 MG tablet Take 1 tablet (100 mg total) by mouth at bedtime. 08/14/17   Bayard Hugger, NP  rOPINIRole (REQUIP) 0.5 MG tablet TAKE  2 TABLETS AT BEDTIME. 08/14/17   Bayard Hugger, NP  rOPINIRole (REQUIP) 0.5 MG tablet TAKE 2 TABLETS AT BEDTIME. 09/23/17   Meredith Staggers, MD  Sennosides (SENNA LAX PO) As directed 05/20/17   [provider]  SPIRIVA HANDIHALER 18 MCG inhalation capsule PLACE 1 CAPSULE INTO INHALER AND INHALE ONCE DAILY. 04/02/17   Chesley Mires, MD  tiZANidine (ZANAFLEX) 4 MG tablet  TAKE 1 TO 1 AND 1/2 TABLETS BY MOUTH EVERY 6 HOURS AS NEEDED FOR MUSCLE PAIN/ SPASMS 09/29/17   Meredith Staggers, MD  valACYclovir (VALTREX) 500 MG tablet TAKE 1 TABLET BY MOUTH DAILY. 09/05/17   [name], MD  venlafaxine XR (EFFEXOR-XR) 150 MG 24 hr capsule TAKE 2 CAPSULES BY MOUTH DAILY. 09/18/17   [name], MD  Vitamin D, Ergocalciferol, (DRISDOL) 50000 units CAPS capsule Take 1 capsule (50,000 Units total) by mouth every 7 (seven) days. 03/07/17   [name], MD    Family History Family History  Problem Relation Age of Onset  . Kidney disease Mother   . Heart disease Father   . Anuerysm Brother 29       brain  . Heart disease Brother   . Heart disease Sister 69       s/p CABG  . Hypertension Sister   . Colon cancer Neg Hx     Social History Social History   Tobacco Use  . Smoking status: Current Every Day Smoker    Packs/day: 1.25    Years: 45.00    Pack years: 56.25    Types: Cigarettes  . Smokeless tobacco: Never Used  . Tobacco comment: form given 12/25/16  Substance Use Topics  . Alcohol use: No    Alcohol/week: 0.0 oz  . Drug use: No    Comment: 01/31/2016 "none since ~ 1980"     Allergies   Flagyl [metronidazole]; Sulfa antibiotics; Amoxicillin; Chlorzoxazone; Codeine; Darvocet [propoxyphene n-acetaminophen]; Dilaudid [hydromorphone hcl]; Keflex [cephalexin]; Morphine and related; Nitrofurantoin monohyd macro; and Percocet [oxycodone-acetaminophen]   Review of Systems Review of Systems  Gastrointestinal: Positive for abdominal pain.  Genitourinary: Positive for dysuria and frequency.  All other systems reviewed and are negative.    Physical Exam Updated Vital Signs BP (!) 147/87   Pulse 81   Temp 98.4 F (36.9 C) (Oral)   Resp 16   Ht 5\' 4"  (1.626 m)   Wt 69.4 kg (153 lb)   SpO2 93%   BMI 26.26 kg/m   Physical Exam  Constitutional: She is oriented to person, place, and time. She appears  well-developed and well-nourished. No distress.   HENT:  Head: Normocephalic and atraumatic.  Eyes: Conjunctivae and EOM are normal. Pupils are equal, round, and reactive to light.  Neck: Normal range of motion. Neck supple.  Cardiovascular: Normal rate, regular rhythm and intact distal pulses.  Pulmonary/Chest: Effort normal and breath sounds normal. No respiratory distress. She has no wheezes.  Abdominal: Soft. She exhibits no distension and no mass. There is tenderness. There is no guarding.  Mild suprapubic abd tenderness.  Abdomen soft, without rigidity, guarding, or distention.  Musculoskeletal: Normal range of motion.  Neurological: She is alert and oriented to person, place, and time.  Skin: Skin is warm and dry.  Psychiatric: She has a normal mood and affect.  Nursing note and vitals reviewed.    ED Treatments / Results  Labs (all labs ordered are listed, but only abnormal results are displayed) Labs Reviewed  COMPREHENSIVE METABOLIC PANEL - Abnormal; Notable for the following components:      Result Value   Glucose, Bld 118 (*)    Creatinine, Ser 1.09 (*)    GFR calc non Af Amer 53 (*)    All other components within normal limits  URINALYSIS, ROUTINE W REFLEX MICROSCOPIC - Abnormal; Notable for the following components:   Color, Urine AMBER (*)    Protein, ur 30 (*)    Nitrite POSITIVE (*)    Squamous Epithelial / LPF 0-5 (*)    All other components within normal limits  LIPASE, BLOOD  CBC    EKG  EKG Interpretation None       Radiology No results found.  Procedures Procedures (including critical care time)  Medications Ordered in ED Medications  cefTRIAXone (ROCEPHIN) injection 250 mg (250 mg Intramuscular Given 10/09/17 2028)  lidocaine (PF) (XYLOCAINE) 1 % injection (  Given 10/09/17 2033)     Initial Impression / Assessment and Plan / ED Course  I have reviewed the triage vital signs and the nursing notes.  Pertinent labs & imaging results that were available during my care of the patient were  reviewed by me and considered in my medical decision making (see chart for details).     Patient presenting for evaluation of suprapubic abdominal pressure and dysuria.  Physical exam reassuring, she is afebrile not tachycardic.  She appears nontoxic.  Abdominal exam shows mild tenderness palpation of suprapubic area.  Labs reassuring, no leukocytosis, creatinine improved from last visit.  Doubt pyleo or systemic infection.  Urine positive for UTI.  Will give shot of Rocephin and discharged with oral antibiotics.  Patient states she has taken amoxicillin without ADRs.  States she frequently gets yeast infections with antibiotics.  Discussed follow-up with primary care as needed.  At this time, patient appears safe for discharge.  Return precautions given.  Patient states she understands and agrees to plan.   Final Clinical Impressions(s) / ED Diagnoses   Final diagnoses:  Acute cystitis without hematuria    ED Discharge Orders        Ordered    cephALEXin (KEFLEX) 500 MG capsule  2 times daily     10/09/17 2025    fluconazole (DIFLUCAN) 150 MG tablet  Daily     10/09/17 2025       Franchot Heidelberg, PA-C 10/09/17 2243    Sherwood Gambler, MD 10/10/17 2216

## 2017-10-09 NOTE — ED Triage Notes (Signed)
Pt reports lower abd pain x 2 weeks, reports recent dysuria, denies vag bleeding.  Pt reports taking azo with no relief. Pt reports, "My daughter gave me 3 doxycycline but they didn't help either." Pt denies fevers, chills.

## 2017-10-09 NOTE — ED Notes (Signed)
Patient verbalizes understanding of discharge instructions. Opportunity for questioning and answers were provided. Armband removed by staff, pt discharged from ED ambulatory.   

## 2017-10-09 NOTE — Discharge Instructions (Signed)
Take antibiotics as prescribed. Take the entire course, even if your symptoms improve.  Use tylenol or ibuprofen as needed for pain.  Use the diflucan as needed for yeast infections after finishing the antibitioics. Follow up with your primary care doctor next week if symptoms are not improving.  Return to the ER if you develop persistent high fevers, persistent vomiting, or any new or worsening symptoms.

## 2017-10-09 NOTE — ED Notes (Signed)
ED Provider at bedside. 

## 2017-10-10 ENCOUNTER — Encounter: Payer: Self-pay | Admitting: Registered Nurse

## 2017-10-10 ENCOUNTER — Encounter: Payer: Medicare Other | Attending: Physical Medicine & Rehabilitation | Admitting: Registered Nurse

## 2017-10-10 ENCOUNTER — Other Ambulatory Visit: Payer: Self-pay

## 2017-10-10 ENCOUNTER — Other Ambulatory Visit: Payer: Self-pay | Admitting: Internal Medicine

## 2017-10-10 VITALS — BP 139/84 | HR 96

## 2017-10-10 DIAGNOSIS — Z79899 Other long term (current) drug therapy: Secondary | ICD-10-CM

## 2017-10-10 DIAGNOSIS — M4316 Spondylolisthesis, lumbar region: Secondary | ICD-10-CM | POA: Diagnosis not present

## 2017-10-10 DIAGNOSIS — M2170 Unequal limb length (acquired), unspecified site: Secondary | ICD-10-CM | POA: Insufficient documentation

## 2017-10-10 DIAGNOSIS — K219 Gastro-esophageal reflux disease without esophagitis: Secondary | ICD-10-CM | POA: Diagnosis not present

## 2017-10-10 DIAGNOSIS — G4709 Other insomnia: Secondary | ICD-10-CM

## 2017-10-10 DIAGNOSIS — E039 Hypothyroidism, unspecified: Secondary | ICD-10-CM | POA: Diagnosis not present

## 2017-10-10 DIAGNOSIS — M961 Postlaminectomy syndrome, not elsewhere classified: Secondary | ICD-10-CM

## 2017-10-10 DIAGNOSIS — I10 Essential (primary) hypertension: Secondary | ICD-10-CM | POA: Insufficient documentation

## 2017-10-10 DIAGNOSIS — M5136 Other intervertebral disc degeneration, lumbar region: Secondary | ICD-10-CM | POA: Diagnosis not present

## 2017-10-10 DIAGNOSIS — M62830 Muscle spasm of back: Secondary | ICD-10-CM

## 2017-10-10 DIAGNOSIS — Z720 Tobacco use: Secondary | ICD-10-CM | POA: Diagnosis not present

## 2017-10-10 DIAGNOSIS — J449 Chronic obstructive pulmonary disease, unspecified: Secondary | ICD-10-CM | POA: Diagnosis not present

## 2017-10-10 DIAGNOSIS — G9619 Other disorders of meninges, not elsewhere classified: Secondary | ICD-10-CM | POA: Insufficient documentation

## 2017-10-10 DIAGNOSIS — Z5181 Encounter for therapeutic drug level monitoring: Secondary | ICD-10-CM

## 2017-10-10 DIAGNOSIS — G894 Chronic pain syndrome: Secondary | ICD-10-CM | POA: Insufficient documentation

## 2017-10-10 DIAGNOSIS — E079 Disorder of thyroid, unspecified: Secondary | ICD-10-CM | POA: Insufficient documentation

## 2017-10-10 DIAGNOSIS — M5126 Other intervertebral disc displacement, lumbar region: Secondary | ICD-10-CM | POA: Diagnosis not present

## 2017-10-10 DIAGNOSIS — Z9981 Dependence on supplemental oxygen: Secondary | ICD-10-CM | POA: Diagnosis not present

## 2017-10-10 DIAGNOSIS — E785 Hyperlipidemia, unspecified: Secondary | ICD-10-CM | POA: Diagnosis not present

## 2017-10-10 DIAGNOSIS — G2581 Restless legs syndrome: Secondary | ICD-10-CM

## 2017-10-10 DIAGNOSIS — M170 Bilateral primary osteoarthritis of knee: Secondary | ICD-10-CM | POA: Diagnosis not present

## 2017-10-10 DIAGNOSIS — Z9889 Other specified postprocedural states: Secondary | ICD-10-CM | POA: Diagnosis not present

## 2017-10-10 MED ORDER — FENTANYL 50 MCG/HR TD PT72: 50.0000 ug | MEDICATED_PATCH | TRANSDERMAL | 0 refills | Status: DC

## 2017-10-10 MED ORDER — OXYCODONE-ACETAMINOPHEN 10-325 MG PO TABS: 1.0000 | ORAL_TABLET | Freq: Four times a day (QID) | ORAL | 0 refills | Status: DC | PRN

## 2017-10-10 NOTE — Progress Notes (Signed)
Subjective:    Patient ID: Jill Phillips, female    DOB: 10-Dec-1955, 62 y.o.   MRN: [id number]  HPI: [name] is a 62 year old female who returns for follow up appointmentfor chronic pain and medication refill. She states her pain is located in her mid-back and bilateral hips. [name] was instructed to keep a pain journal, we discussed her MME. She verbalizes understanding.   [name] went to [facility] on 10/09/2017 for Acute Cystitis, note reviewed.   [name] Morphine equivalent is 186.00 MME.  She's also prescribed Klonopin. We have reviewed the black box warning again regarding using opioids and benzodiazepines. I highlighted the dangers of using these drugs together and discussed the adverse events including respiratory suppression, overdose, cognitive impairment and importance of  compliance with current regimen. She verbalizes understanding, we will continue to monitor and adjust as indicated.    Daughter in the room.  Oral Swab  was performed on 07/16/2017, it was positive for Fentanyl, see note for details. Oral Swab was performed today.  On 01/23/2017, 11/28/2016 and 08/08/2016  UDS was Performed and Consistent.   Pain Inventory Average Pain 8 Pain Right Now 5 My pain is sharp and aching  In the last 24 hours, has pain interfered with the following? General activity 9 Relation with others 9 Enjoyment of life 9 What TIME of day is your pain at its worst? morning and evening Sleep (in general) Fair  Pain is worse with: walking, bending, inactivity and standing Pain improves with: heat/ice Relief from Meds: 7  Mobility walk without assistance ability to climb steps?  no do you drive?  yes transfers alone Do you have any goals in this area?  no  Function Do you have any goals in this area?  no  Neuro/Psych bladder control problems numbness trouble walking spasms  Prior Studies Any changes since last visit?   no  Physicians involved in your care Any changes since last visit?  no   Family History  Problem Relation Age of Onset  . Kidney disease Mother   . Heart disease Father   . Anuerysm Brother 29       brain  . Heart disease Brother   . Heart disease Sister 69       s/p CABG  . Hypertension Sister   . Colon cancer Neg Hx    Social History   Socioeconomic History  . Marital status: Divorced    Spouse name: n/a  . Number of children: 2  . Years of education: 12+  . Highest education level: None  Social Needs  . Financial resource strain: None  . Food insecurity - worry: None  . Food insecurity - inability: None  . Transportation needs - medical: None  . Transportation needs - non-medical: None  Occupational History  . Occupation: disability    Comment: back surgeries  Tobacco Use  . Smoking status: Current Every Day Smoker    Packs/day: 1.25    Years: 45.00    Pack years: 56.25    Types: Cigarettes  . Smokeless tobacco: Never Used  . Tobacco comment: form given 12/25/16  Substance and Sexual Activity  . Alcohol use: No    Alcohol/week: 0.0 oz  . Drug use: No    Comment: 01/31/2016 "none since ~ 1980"  . Sexual activity: No    Partners: Male  Other Topics Concern  . None  Social History Narrative   Lives alone.  One daughter is local, but is getting ready to move to [place], where her children live with their father.  The other daughter lives near [place], [place].   Past Surgical History:  Procedure Laterality Date  . APPENDECTOMY    . BACK SURGERY     18 back surgeries (2 thoracic & 16 lumbar) (01/31/2016)  . DILATION AND CURETTAGE OF UTERUS    . HAMMER TOE SURGERY    . IR RADIOLOGIST EVAL & MGMT  07/21/2017  . JOINT REPLACEMENT    . KNEE ARTHROSCOPY Right   . LAPAROSCOPIC CHOLECYSTECTOMY    . LUMBAR FUSION N/A 08/01/2015   Procedure: Right sided L1-2 and L2-3 transforaminal lumbar interbody fusion with cages, Extension of posterior fusion T12 to L3, Replaced  pedicle screws bilaterally L1-L2 , Replaced left sided pedicle screws L-3. Instrumentation T12 to L3 using local bone graft, Vivigen allograft and cancellous chips;  Surgeon: [name], MD;  Location: [place];  Service: Orthopedics;  Laterality: N/A;  . LUMBAR LAMINECTOMY/DECOMPRESSION MICRODISCECTOMY N/A 01/28/2014   Procedure: Minimally Invasive Right  L1-2 Microdiscectomy;  Surgeon: [name], MD;  Location: [place];  Service: Orthopedics;  Laterality: N/A;  . TOTAL HIP ARTHROPLASTY Right   . TOTAL KNEE ARTHROPLASTY  05/29/2012   Procedure: TOTAL KNEE ARTHROPLASTY;  Surgeon: Mcarthur Rossetti, MD;  Location: [facility];  Service: Orthopedics;  Laterality: Right;  Right Total Knee Arthroplasty  . TUBAL LIGATION    . UVULOPALATOPHARYNGOPLASTY    . VAGINAL HYSTERECTOMY     Past Medical History:  Diagnosis Date  . Active smoker   . Anxiety   . Calcifying tendinitis of shoulder   . Chronic back pain   . Chronic pain syndrome   . COPD (chronic obstructive pulmonary disease) ([place])   . Dysthymic disorder   . Emphysema lung ([place])   . GERD (gastroesophageal reflux disease)   . Headache    "weekly maybe" (01/31/2016)  . Heart murmur    for years, nothing to be concerned about  . Herpes genitalia   . History of blood transfusion 1980   related to "back surgery"  . Hyperlipidemia   . Hypertension   . Hypothyroidism   . Lumbago   . Osteoarthrosis, unspecified whether generalized or localized, lower leg   . Pain in joint, upper arm   . Pneumothorax, left 01/31/2016   S/P Left posterior subcostal pain injection on 01/30/2016  . PONV (postoperative nausea and vomiting)    gets nauseous  with longer surgery. Difficuty voiding after surgery  . Postlaminectomy syndrome, thoracic region   . Primary localized osteoarthrosis, lower leg   . Restless leg syndrome   . Sleep apnea    s/p surgery- last sleep study 2011- doesnt use oxygen or machine at night as instructed,.   12/2014- Dr Halford Chessman  reports  it is negative.  . Thyroid disease    BP 139/84   Pulse 96   SpO2 90%   Opioid Risk Score:  6 Fall Risk Score:  `1  Depression screen PHQ 2/9  Depression screen [facility] 2/9 10/10/2017 09/10/2017 06/13/2017 05/15/2016 05/02/2016 12/07/2015 11/15/2015  Decreased Interest 1 1 0 0 0 0 0  Down, Depressed, Hopeless 1 1 0 0 0 0 0  PHQ - 2 Score 2 2 0 0 0 0 0  Some recent data might be hidden      Review of Systems  Constitutional: Negative.   HENT: Negative.   Eyes: Negative.   Respiratory: Positive for shortness of  breath.   Cardiovascular: Negative.   Gastrointestinal: Negative.   Endocrine: Negative.   Genitourinary: Positive for dysuria.       UTI- went to ED yesterday  Musculoskeletal: Negative.   Skin: Negative.   Allergic/Immunologic: Negative.   Neurological: Negative.   Hematological: Negative.   Psychiatric/Behavioral: Negative.   All other systems reviewed and are negative.      Objective:   Physical Exam  Constitutional: She is oriented to person, place, and time. She appears well-developed and well-nourished.  HENT:  Head: Normocephalic and atraumatic.  Neck: Normal range of motion. Neck supple.  Cardiovascular: Normal rate and regular rhythm.  Pulmonary/Chest: Effort normal and breath sounds normal.  Musculoskeletal:  Normal Muscle Bulk and Muscle Testing Reveals: Upper Extremities: Full ROM and Muscle Strength 5/5 Lumbar Paraspinal Tenderness: L-3-L-5 Left Greater Trochanter Tenderness Lower Extremities: Full l ROM and Muscle Strength 5/5 Arises from Table with ease Narrow Based gait   Neurological: She is alert and oriented to person, place, and time.  Skin: Skin is warm and dry.  Psychiatric: She has a normal mood and affect.  Nursing note and vitals reviewed.         Assessment & Plan:  1. Lumbar Degenerative Disc/Chronic lumbar spine pain/post-lami syndrome: 10/10/2017 Refilled: Fentanyl 50 MCG one patch every three days #10 and Oxycodone 10/325 mg  one tablet every 6 hours as needed for pain #120. We will continue the opioid monitoring program, this consists of regular clinic visits, examinations, urine drug screen, pill counts as well as use of [place] Controlled Substance Reporting System. 2. Chronic Thoracic Pain: :Continue current medication regime and Continue to Monitor.10/10/17 3. Osteoarthritis of left knee: No complaints today. S/P Cortisone Injection on 05/12/2017 by Dr. [name]. Continue with Heat, exercise and voltaren gel. 10/10/2017 4. Rotator cuff syndrome/subacromial bursitis: No complaints voiced today. Continue to Monitor. 10/10/2017. 5. Restless legs syndrome: Continue Requip.Continue to monitor. 10/10/2017 6. Tobacco Abuse: Continue Smoking Cessation: PCP prescribed Nicotine Patches : 10/10/2017 7. Muscle Spasm: Continue Tizanidine. 10/10/2017 8. Insomnia: Continue Seroquel. 10/10/2017 9. Neuropathic Pain: Continue Gabapentin.10/10/2017 10.Anxiety: Continue Klonopin. 10/10/2017 11 Chronic Rectal Pain: No complaints Today. 10/10/2017  20 minutes of face to face patient care time was spent during this visit. All questions were encouraged and answered.  F/u in 1 month

## 2017-10-11 ENCOUNTER — Other Ambulatory Visit: Payer: Self-pay | Admitting: Internal Medicine

## 2017-10-13 ENCOUNTER — Ambulatory Visit: Payer: Medicare Other | Admitting: Family

## 2017-10-13 ENCOUNTER — Encounter: Payer: Self-pay | Admitting: Family

## 2017-10-13 ENCOUNTER — Other Ambulatory Visit: Payer: Medicare Other

## 2017-10-13 ENCOUNTER — Ambulatory Visit (INDEPENDENT_AMBULATORY_CARE_PROVIDER_SITE_OTHER): Payer: Medicare Other | Admitting: Family

## 2017-10-13 VITALS — BP 138/80 | HR 85 | Temp 98.7°F | Ht 64.0 in | Wt 156.0 lb

## 2017-10-13 DIAGNOSIS — R300 Dysuria: Secondary | ICD-10-CM | POA: Diagnosis not present

## 2017-10-13 LAB — POC URINALSYSI DIPSTICK (AUTOMATED)
Bilirubin, UA: NEGATIVE
Blood, UA: NEGATIVE
Glucose, UA: NEGATIVE
Ketones, UA: NEGATIVE
Leukocytes, UA: NEGATIVE
Nitrite, UA: NEGATIVE
Protein, UA: NEGATIVE
Spec Grav, UA: 1.02 (ref 1.010–1.025)
Urobilinogen, UA: 0.2 E.U./dL
pH, UA: 5.5 (ref 5.0–8.0)

## 2017-10-13 MED ORDER — CEFTRIAXONE SODIUM 500 MG IJ SOLR
500.0000 mg | Freq: Once | INTRAMUSCULAR | Status: AC
Start: 2017-10-13 — End: 2017-10-13
  Administered 2017-10-13: 500 mg via INTRAMUSCULAR

## 2017-10-13 NOTE — Progress Notes (Signed)
Jill Phillips is a 62 y.o. female with the following history as recorded in EpicCare:  Patient Active Problem List   Diagnosis Date Noted  . Sacral pain 09/17/2017  . COPD (chronic obstructive pulmonary disease) ([place]) 04/21/2017  . Lower back pain 03/03/2017  . CKD (chronic kidney disease) stage 3, GFR 30-59 ml/min ([facility]) 08/07/2016  . GERD (gastroesophageal reflux disease) 08/07/2016  . Prediabetes 08/07/2016  . Chronic respiratory failure ([place]) 07/29/2016  . Hypersomnia 07/29/2016  . Arthritis of carpometacarpal [facility]) joint of left thumb 07/09/2016  . Lump of axilla 05/02/2016  . Pneumothorax on left 01/31/2016  . Chronic, continuous use of opioids 09/06/2015  . Degenerative disc disease, lumbar 08/01/2015    Class: Chronic  . Spondylolisthesis of lumbar region 08/01/2015    Class: Chronic  . Urinary, incontinence, stress female 05/06/2014  . Constipation 05/05/2014  . 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 HTN (hypertension) 05/19/2013  . HLD (hyperlipidemia) 05/19/2013  . Depression 05/19/2013  . Hypothyroidism 05/19/2013  . Tobacco abuse   . Osteoarthritis of both knees 11/18/2011  . RLS (restless legs syndrome) 11/18/2011    Current Outpatient Medications  Medication Sig Dispense Refill  . acetaminophen (TYLENOL) 500 MG tablet Take 1,000 mg by mouth every 6 (six) hours as needed for headache.     . albuterol (PROAIR HFA) 108 (90 Base) MCG/ACT inhaler INHALE 1 PUFF INTO THE LUNGS EVERY 6 HOURS AS NEEDED FOR WHEEZING OR SHORTNESS OF BREATH.NEED OFFICE VISIT FOR FURTHER REFILLS 8.5 g 0  . albuterol (VENTOLIN HFA) 108 (90 Base) MCG/ACT inhaler Inhale 2 puffs into the lungs every 6 (six) hours as needed for wheezing or shortness of breath. 18 g 3  . amLODipine (NORVASC) 10 MG tablet Take 1 tablet (10 mg total) by mouth daily. Follow-up appt due in Jan must see provider for future refills 30 tablet 0  . atorvastatin (LIPITOR) 20 MG tablet Take 1 tablet (20 mg total) by mouth daily. 90  tablet 3  . benazepril (LOTENSIN) 40 MG tablet TAKE 1 TABLET BY MOUTH DAILY. 90 tablet 1  . cephALEXin (KEFLEX) 500 MG capsule Take 1 capsule (500 mg total) by mouth 2 (two) times daily for 7 days. 14 capsule 0  . clonazePAM (KLONOPIN) 2 MG tablet TAKE 1 TABLET BY MOUTH 2 TIMES A DAY AS NEEDED. 60 tablet 2  . diclofenac (VOLTAREN) 75 MG EC tablet Take 1 tablet (75 mg total) 2 (two) times daily with a meal by mouth. 60 tablet 3  . fentaNYL (DURAGESIC - DOSED MCG/HR) 50 MCG/HR Place 1 patch (50 mcg total) onto the skin every 3 (three) days. 10 patch 0  . fluconazole (DIFLUCAN) 150 MG tablet   0  . gabapentin (NEURONTIN) 300 MG capsule Take 1 capsule (300 mg total) by mouth 4 (four) times daily. 120 capsule 5  . hydrochlorothiazide (HYDRODIURIL) 12.5 MG tablet Take 1 tablet (12.5 mg total) by mouth daily. 90 tablet 0  . levothyroxine (SYNTHROID, LEVOTHROID) 88 MCG tablet Take 1 tablet (88 mcg total) by mouth daily. 90 tablet 1  . nicotine (NICODERM CQ) 21 mg/24hr patch Place 1 patch (21 mg total) onto the skin daily. 28 patch 1  . omeprazole (PRILOSEC) 40 MG capsule Take 1 capsule (40 mg total) by mouth 2 (two) times daily before a meal. 180 capsule 3  . oxyCODONE-acetaminophen (PERCOCET) 10-325 MG tablet Take 1 tablet by mouth every 6 (six) hours as needed for pain. 120 tablet 0  . QUEtiapine (SEROQUEL) 100 MG tablet Take  1 tablet (100 mg total) by mouth at bedtime. 30 tablet 2  . rOPINIRole (REQUIP) 0.5 MG tablet TAKE 2 TABLETS AT BEDTIME. 60 tablet 2  . Sennosides (SENNA LAX PO) As directed    . SPIRIVA HANDIHALER 18 MCG inhalation capsule PLACE 1 CAPSULE INTO INHALER AND INHALE ONCE DAILY. 30 capsule 0  . tiZANidine (ZANAFLEX) 4 MG tablet TAKE 1 TO 1 AND 1/2 TABLETS BY MOUTH EVERY 6 HOURS AS NEEDED FOR MUSCLE PAIN/ SPASMS 150 tablet 3  . valACYclovir (VALTREX) 500 MG tablet TAKE 1 TABLET BY MOUTH DAILY. 30 tablet 5  . venlafaxine XR (EFFEXOR-XR) 150 MG 24 hr capsule TAKE 2 CAPSULES BY MOUTH  DAILY. 60 capsule 2  . Vitamin D, Ergocalciferol, (DRISDOL) 50000 units CAPS capsule Take 1 capsule (50,000 Units total) by mouth every 7 (seven) days. 8 capsule 0   Current Facility-Administered Medications  Medication Dose Route Frequency Provider Last Rate Last Dose  . calcitonin (salmon) (MIACALCIN/FORTICAL) nasal spray 1 spray  1 spray Alternating Nares Daily [name], MD        Allergies: Flagyl [metronidazole]; Sulfa antibiotics; Amoxicillin; Chlorzoxazone; Codeine; Darvocet [propoxyphene n-acetaminophen]; Dilaudid [hydromorphone hcl]; Keflex [cephalexin]; Morphine and related; Nitrofurantoin monohyd macro; and Percocet [oxycodone-acetaminophen]  Past Medical History:  Diagnosis Date  . Active smoker   . Anxiety   . Calcifying tendinitis of shoulder   . Chronic back pain   . Chronic pain syndrome   . COPD (chronic obstructive pulmonary disease) ([place])   . Dysthymic disorder   . Emphysema lung ([place])   . GERD (gastroesophageal reflux disease)   . Headache    "weekly maybe" (01/31/2016)  . Heart murmur    for years, nothing to be concerned about  . Herpes genitalia   . History of blood transfusion 1980   related to "back surgery"  . Hyperlipidemia   . Hypertension   . Hypothyroidism   . Lumbago   . Osteoarthrosis, unspecified whether generalized or localized, lower leg   . Pain in joint, upper arm   . Pneumothorax, left 01/31/2016   S/P Left posterior subcostal pain injection on 01/30/2016  . PONV (postoperative nausea and vomiting)    gets nauseous  with longer surgery. Difficuty voiding after surgery  . Postlaminectomy syndrome, thoracic region   . Primary localized osteoarthrosis, lower leg   . Restless leg syndrome   . Sleep apnea    s/p surgery- last sleep study 2011- doesnt use oxygen or machine at night as instructed,.   12/2014- Dr Halford Chessman  reports it is negative.  . Thyroid disease     Past Surgical History:  Procedure Laterality Date  . APPENDECTOMY    . BACK  SURGERY     18 back surgeries (2 thoracic & 16 lumbar) (01/31/2016)  . DILATION AND CURETTAGE OF UTERUS    . HAMMER TOE SURGERY    . IR RADIOLOGIST EVAL & MGMT  07/21/2017  . JOINT REPLACEMENT    . KNEE ARTHROSCOPY Right   . LAPAROSCOPIC CHOLECYSTECTOMY    . LUMBAR FUSION N/A 08/01/2015   Procedure: Right sided L1-2 and L2-3 transforaminal lumbar interbody fusion with cages, Extension of posterior fusion T12 to L3, Replaced pedicle screws bilaterally L1-L2 , Replaced left sided pedicle screws L-3. Instrumentation T12 to L3 using local bone graft, Vivigen allograft and cancellous chips;  Surgeon: [name], MD;  Location: [place];  Service: Orthopedics;  Laterality: N/A;  . LUMBAR LAMINECTOMY/DECOMPRESSION MICRODISCECTOMY N/A 01/28/2014   Procedure: Minimally Invasive Right  L1-2 Microdiscectomy;  Surgeon: [name], MD;  Location: [place];  Service: Orthopedics;  Laterality: N/A;  . TOTAL HIP ARTHROPLASTY Right   . TOTAL KNEE ARTHROPLASTY  05/29/2012   Procedure: TOTAL KNEE ARTHROPLASTY;  Surgeon: Mcarthur Rossetti, MD;  [facility];  Service: Orthopedics;  Laterality: Right;  Right Total Knee Arthroplasty  . TUBAL LIGATION    . UVULOPALATOPHARYNGOPLASTY    . VAGINAL HYSTERECTOMY      Family History  Problem Relation Age of Onset  . Kidney disease Mother   . Heart disease Father   . Anuerysm Brother 29       brain  . Heart disease Brother   . Heart disease Sister 60       s/p CABG  . Hypertension Sister   . Colon cancer Neg Hx     Social History   Tobacco Use  . Smoking status: Current Every Day Smoker    Packs/day: 1.25    Years: 45.00    Pack years: 56.25    Types: Cigarettes  . Smokeless tobacco: Never Used  . Tobacco comment: form given 12/25/16  Substance Use Topics  . Alcohol use: No    Alcohol/week: 0.0 oz    Subjective:  Patient was seen at the ER on 10/09/2017 with suspected UTI; she notes she is prone to UTIs; based on labs, U/A did show protein and  nitrites at that time; she was given shot of Rocephin and discharged on Keflex; she is concerned that the infection is still persisting; still having some urinary discomfort; wonders about getting another shot of antibiotics today if possible; denies any fever, blood in urine, flank pain;   Objective:  Vitals:   10/13/17 1530  BP: 138/80  Pulse: 85  Temp: 98.7 F (37.1 C)  TempSrc: Oral  SpO2: 97%  Weight: 156 lb 0.6 oz (70.8 kg)  Height: 5\' 4"  (1.626 m)    General: Well developed, well nourished, in no acute distress  Skin : Warm and dry.  Lungs: Respirations unlabored; clear to auscultation bilaterally without wheeze, rales, rhonchi  Vessels: Symmetric bilaterally  Neurologic: Alert and oriented; speech intact; face symmetrical; moves all extremities well; CNII-XII intact without focal deficit  Assessment:  1. Dysuria     Plan:  Reviewed notes and labs from recent ER visit; check U/A in office today-unremarkable; will send out culture; in the interim, agree to give her one more shot of Rocephin before starting on more oral antibiotics; she agrees to this plan; follow-up to be determined.   No Follow-up on file.  Orders Placed This Encounter  Procedures  . Urine Culture    Standing Status:   Future    Number of Occurrences:   1    Standing Expiration Date:   10/13/2018  . POCT Urinalysis Dipstick (Automated)    Requested Prescriptions    No prescriptions requested or ordered in this encounter

## 2017-10-14 LAB — URINE CULTURE
MICRO NUMBER:: 90179998
Result:: NO GROWTH
SPECIMEN QUALITY:: ADEQUATE

## 2017-10-21 ENCOUNTER — Encounter (HOSPITAL_COMMUNITY): Payer: Self-pay

## 2017-10-21 ENCOUNTER — Emergency Department (HOSPITAL_COMMUNITY)
Admission: EM | Admit: 2017-10-21 | Discharge: 2017-10-21 | Disposition: A | Discharge status: Home / Self Care | Payer: Medicare Other | Attending: Emergency Medicine | Admitting: Emergency Medicine

## 2017-10-21 ENCOUNTER — Other Ambulatory Visit: Payer: Self-pay

## 2017-10-21 DIAGNOSIS — K6289 Other specified diseases of anus and rectum: Secondary | ICD-10-CM | POA: Insufficient documentation

## 2017-10-21 DIAGNOSIS — E785 Hyperlipidemia, unspecified: Secondary | ICD-10-CM | POA: Diagnosis not present

## 2017-10-21 DIAGNOSIS — Z79899 Other long term (current) drug therapy: Secondary | ICD-10-CM | POA: Diagnosis not present

## 2017-10-21 DIAGNOSIS — N183 Chronic kidney disease, stage 3 (moderate): Secondary | ICD-10-CM | POA: Diagnosis not present

## 2017-10-21 DIAGNOSIS — E039 Hypothyroidism, unspecified: Secondary | ICD-10-CM | POA: Diagnosis not present

## 2017-10-21 DIAGNOSIS — G8929 Other chronic pain: Secondary | ICD-10-CM | POA: Diagnosis not present

## 2017-10-21 DIAGNOSIS — J449 Chronic obstructive pulmonary disease, unspecified: Secondary | ICD-10-CM | POA: Insufficient documentation

## 2017-10-21 DIAGNOSIS — F17210 Nicotine dependence, cigarettes, uncomplicated: Secondary | ICD-10-CM | POA: Diagnosis not present

## 2017-10-21 DIAGNOSIS — I129 Hypertensive chronic kidney disease with stage 1 through stage 4 chronic kidney disease, or unspecified chronic kidney disease: Secondary | ICD-10-CM | POA: Diagnosis not present

## 2017-10-21 MED ORDER — KETOROLAC TROMETHAMINE 60 MG/2ML IM SOLN
60.0000 mg | Freq: Once | INTRAMUSCULAR | Status: AC
Start: 2017-10-21 — End: 2017-10-21
  Administered 2017-10-21: 60 mg via INTRAMUSCULAR
  Filled 2017-10-21: qty 2

## 2017-10-21 NOTE — ED Provider Notes (Signed)
[facility] DEPT Provider Note   CSN: [id number] Arrival date & time: 10/21/17  1220     History   Chief Complaint Chief Complaint  Patient presents with  . Rectal Pain    HPI Jill Phillips is a 62 y.o. female.  62 year old female presents with worsening chronic tailbone pain for over a year.  Denies any new injury.  States she has had some anal pressure but denies any discharge or fever or chills.  No abdominal discomfort.  Pain is worse with standing.  Does go to pain management and has been prescribed Percocets.  Has used those without relief.      Past Medical History:  Diagnosis Date  . Active smoker   . Anxiety   . Calcifying tendinitis of shoulder   . Chronic back pain   . Chronic pain syndrome   . COPD (chronic obstructive pulmonary disease) ([place])   . Dysthymic disorder   . Emphysema lung ([place])   . GERD (gastroesophageal reflux disease)   . Headache    "weekly maybe" (01/31/2016)  . Heart murmur    for years, nothing to be concerned about  . Herpes genitalia   . History of blood transfusion 1980   related to "back surgery"  . Hyperlipidemia   . Hypertension   . Hypothyroidism   . Lumbago   . Osteoarthrosis, unspecified whether generalized or localized, lower leg   . Pain in joint, upper arm   . Pneumothorax, left 01/31/2016   S/P Left posterior subcostal pain injection on 01/30/2016  . PONV (postoperative nausea and vomiting)    gets nauseous  with longer surgery. Difficuty voiding after surgery  . Postlaminectomy syndrome, thoracic region   . Primary localized osteoarthrosis, lower leg   . Restless leg syndrome   . Sleep apnea    s/p surgery- last sleep study 2011- doesnt use oxygen or machine at night as instructed,.   12/2014- Dr Halford Chessman  reports it is negative.  . Thyroid disease     Patient Active Problem List   Diagnosis Date Noted  . Sacral pain 09/17/2017  . COPD (chronic obstructive pulmonary disease) ([place])  04/21/2017  . Lower back pain 03/03/2017  . CKD (chronic kidney disease) stage 3, GFR 30-59 ml/min (HCC) 08/07/2016  . GERD (gastroesophageal reflux disease) 08/07/2016  . Prediabetes 08/07/2016  . Chronic respiratory failure ([place]) 07/29/2016  . Hypersomnia 07/29/2016  . Arthritis of carpometacarpal [facility]) joint of left thumb 07/09/2016  . Lump of axilla 05/02/2016  . Pneumothorax on left 01/31/2016  . Chronic, continuous use of opioids 09/06/2015  . Degenerative disc disease, lumbar 08/01/2015    Class: Chronic  . Spondylolisthesis of lumbar region 08/01/2015    Class: Chronic  . Urinary, incontinence, stress female 05/06/2014  . Constipation 05/05/2014  . 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 HTN (hypertension) 05/19/2013  . HLD (hyperlipidemia) 05/19/2013  . Depression 05/19/2013  . Hypothyroidism 05/19/2013  . Tobacco abuse   . Osteoarthritis of both knees 11/18/2011  . RLS (restless legs syndrome) 11/18/2011    Past Surgical History:  Procedure Laterality Date  . APPENDECTOMY    . BACK SURGERY     18 back surgeries (2 thoracic & 16 lumbar) (01/31/2016)  . DILATION AND CURETTAGE OF UTERUS    . HAMMER TOE SURGERY    . IR RADIOLOGIST EVAL & MGMT  07/21/2017  . JOINT REPLACEMENT    . KNEE ARTHROSCOPY Right   . LAPAROSCOPIC CHOLECYSTECTOMY    . LUMBAR FUSION  N/A 08/01/2015   Procedure: Right sided L1-2 and L2-3 transforaminal lumbar interbody fusion with cages, Extension of posterior fusion T12 to L3, Replaced pedicle screws bilaterally L1-L2 , Replaced left sided pedicle screws L-3. Instrumentation T12 to L3 using local bone graft, Vivigen allograft and cancellous chips;  Surgeon: [name], MD;  Location: [facility];  Service: Orthopedics;  Laterality: N/A;  . LUMBAR LAMINECTOMY/DECOMPRESSION MICRODISCECTOMY N/A 01/28/2014   Procedure: Minimally Invasive Right  L1-2 Microdiscectomy;  Surgeon: [name], MD;  Location: [facility];  Service: Orthopedics;  Laterality: N/A;  . TOTAL HIP  ARTHROPLASTY Right   . TOTAL KNEE ARTHROPLASTY  05/29/2012   Procedure: TOTAL KNEE ARTHROPLASTY;  Surgeon: Mcarthur Rossetti, MD;  Location: [facility];  Service: Orthopedics;  Laterality: Right;  Right Total Knee Arthroplasty  . TUBAL LIGATION    . UVULOPALATOPHARYNGOPLASTY    . VAGINAL HYSTERECTOMY      OB History    No data available       Home Medications    Prior to Admission medications   Medication Sig Start Date End Date Taking? Authorizing Provider  acetaminophen (TYLENOL) 500 MG tablet Take 1,000 mg by mouth every 6 (six) hours as needed for headache.     [provider]  albuterol (PROAIR HFA) 108 (90 Base) MCG/ACT inhaler INHALE 1 PUFF INTO THE LUNGS EVERY 6 HOURS AS NEEDED FOR WHEEZING OR SHORTNESS OF BREATH.NEED OFFICE VISIT FOR FURTHER REFILLS 09/05/16   Nche, Charlene Brooke, NP  albuterol (VENTOLIN HFA) 108 (90 Base) MCG/ACT inhaler Inhale 2 puffs into the lungs every 6 (six) hours as needed for wheezing or shortness of breath. 05/09/17   Parrett, Fonnie Mu, NP  amLODipine (NORVASC) 10 MG tablet Take 1 tablet (10 mg total) by mouth daily. Follow-up appt due in Jan must see provider for future refills 08/27/17   [name], MD  atorvastatin (LIPITOR) 20 MG tablet Take 1 tablet (20 mg total) by mouth daily. 09/08/17   Burns, Claudina Lick, MD  benazepril (LOTENSIN) 40 MG tablet TAKE 1 TABLET BY MOUTH DAILY. 10/10/17   [name], MD  clonazePAM (KLONOPIN) 2 MG tablet TAKE 1 TABLET BY MOUTH 2 TIMES A DAY AS NEEDED. 09/23/17   Meredith Staggers, MD  diclofenac (VOLTAREN) 75 MG EC tablet Take 1 tablet (75 mg total) 2 (two) times daily with a meal by mouth. 07/16/17   Meredith Staggers, MD  fentaNYL (DURAGESIC - DOSED MCG/HR) 50 MCG/HR Place 1 patch (50 mcg total) onto the skin every 3 (three) days. 10/10/17   Bayard Hugger, NP  fluconazole (DIFLUCAN) 150 MG tablet  10/10/17   [provider]  gabapentin (NEURONTIN) 300 MG capsule Take 1 capsule (300 mg total) by mouth 4  (four) times daily. 08/14/17   Bayard Hugger, NP  hydrochlorothiazide (HYDRODIURIL) 12.5 MG tablet Take 1 tablet (12.5 mg total) by mouth daily. 08/19/17   [name], MD  levothyroxine (SYNTHROID, LEVOTHROID) 88 MCG tablet Take 1 tablet (88 mcg total) by mouth daily. 09/08/17   [name], MD  nicotine (NICODERM CQ) 21 mg/24hr patch Place 1 patch (21 mg total) onto the skin daily. 05/07/17   [name], MD  omeprazole (PRILOSEC) 40 MG capsule Take 1 capsule (40 mg total) by mouth 2 (two) times daily before a meal. 09/05/17   Burns, Claudina Lick, MD  oxyCODONE-acetaminophen (PERCOCET) 10-325 MG tablet Take 1 tablet by mouth every 6 (six) hours as needed for pain.  10/10/17   Bayard Hugger, NP  QUEtiapine (SEROQUEL) 100 MG tablet Take 1 tablet (100 mg total) by mouth at bedtime. 08/14/17   Bayard Hugger, NP  rOPINIRole (REQUIP) 0.5 MG tablet TAKE 2 TABLETS AT BEDTIME. 09/23/17   Meredith Staggers, MD  Sennosides (SENNA LAX PO) As directed 05/20/17   [provider]  SPIRIVA HANDIHALER 18 MCG inhalation capsule PLACE 1 CAPSULE INTO INHALER AND INHALE ONCE DAILY. 04/02/17   Chesley Mires, MD  tiZANidine (ZANAFLEX) 4 MG tablet TAKE 1 TO 1 AND 1/2 TABLETS BY MOUTH EVERY 6 HOURS AS NEEDED FOR MUSCLE PAIN/ SPASMS 09/29/17   Meredith Staggers, MD  valACYclovir (VALTREX) 500 MG tablet TAKE 1 TABLET BY MOUTH DAILY. 09/05/17   [name], MD  venlafaxine XR (EFFEXOR-XR) 150 MG 24 hr capsule TAKE 2 CAPSULES BY MOUTH DAILY. 09/18/17   [name], MD  Vitamin D, Ergocalciferol, (DRISDOL) 50000 units CAPS capsule Take 1 capsule (50,000 Units total) by mouth every 7 (seven) days. 03/07/17   [name], MD    Family History Family History  Problem Relation Age of Onset  . Kidney disease Mother   . Heart disease Father   . Anuerysm Brother 29       brain  . Heart disease Brother   . Heart disease Sister 15       s/p CABG  . Hypertension Sister   . Colon cancer Neg Hx     Social  History Social History   Tobacco Use  . Smoking status: Current Every Day Smoker    Packs/day: 1.25    Years: 45.00    Pack years: 56.25    Types: Cigarettes  . Smokeless tobacco: Never Used  . Tobacco comment: form given 12/25/16  Substance Use Topics  . Alcohol use: No    Alcohol/week: 0.0 oz  . Drug use: No    Comment: 01/31/2016 "none since ~ 1980"     Allergies   Flagyl [metronidazole]; Sulfa antibiotics; Amoxicillin; Chlorzoxazone; Codeine; Darvocet [propoxyphene n-acetaminophen]; Dilaudid [hydromorphone hcl]; Keflex [cephalexin]; Morphine and related; Nitrofurantoin monohyd macro; and Percocet [oxycodone-acetaminophen]   Review of Systems Review of Systems  All other systems reviewed and are negative.    Physical Exam Updated Vital Signs BP (!) 146/83   Pulse 94   Temp 98.3 F (36.8 C) (Oral)   Resp 15   SpO2 92%   Physical Exam  Constitutional: She is oriented to person, place, and time. She appears well-developed and well-nourished.  Non-toxic appearance. No distress.  HENT:  Head: Normocephalic and atraumatic.  Eyes: Conjunctivae, EOM and lids are normal. Pupils are equal, round, and reactive to light.  Neck: Normal range of motion. Neck supple. No tracheal deviation present. No thyroid mass present.  Cardiovascular: Normal rate, regular rhythm and normal heart sounds. Exam reveals no gallop.  No murmur heard. Pulmonary/Chest: Effort normal and breath sounds normal. No stridor. No respiratory distress. She has no decreased breath sounds. She has no wheezes. She has no rhonchi. She has no rales.  Abdominal: Soft. Normal appearance and bowel sounds are normal. She exhibits no distension. There is no tenderness. There is no rebound and no CVA tenderness.  Genitourinary: Rectal exam shows no external hemorrhoid, no internal hemorrhoid and anal tone normal.  Musculoskeletal: Normal range of motion. She exhibits no edema or tenderness.  Neurological: She is alert  and oriented to person, place, and time. She has normal strength. No cranial nerve deficit or sensory  deficit. GCS eye subscore is 4. GCS verbal subscore is 5. GCS motor subscore is 6.  Skin: Skin is warm and dry. No abrasion and no rash noted.  Psychiatric: She has a normal mood and affect. Her speech is normal and behavior is normal.  Nursing note and vitals reviewed.    ED Treatments / Results  Labs (all labs ordered are listed, but only abnormal results are displayed) Labs Reviewed - No data to display  EKG  EKG Interpretation None       Radiology No results found.  Procedures Procedures (including critical care time)  Medications Ordered in ED Medications  ketorolac (TORADOL) injection 60 mg (not administered)     Initial Impression / Assessment and Plan / ED Course  I have reviewed the triage vital signs and the nursing notes.  Pertinent labs & imaging results that were available during my care of the patient were reviewed by me and considered in my medical decision making (see chart for details).     Patient given injection of Toradol for chronic pain.  Encouraged to follow-up with her doctor.  No evidence of abscess or neurological impairment.  Final Clinical Impressions(s) / ED Diagnoses   Final diagnoses:  None    ED Discharge Orders    None       Lacretia [name], MD 10/21/17 [phone]

## 2017-10-21 NOTE — ED Triage Notes (Signed)
Patient reports that she had several fractures of the tailbone which was in 2016 and is now having rectal pain x 1 year. Patient states "really bad x 2 weeks."

## 2017-10-27 ENCOUNTER — Encounter (INDEPENDENT_AMBULATORY_CARE_PROVIDER_SITE_OTHER): Payer: Self-pay | Admitting: Specialist

## 2017-10-27 ENCOUNTER — Ambulatory Visit (INDEPENDENT_AMBULATORY_CARE_PROVIDER_SITE_OTHER): Payer: Medicare Other

## 2017-10-27 ENCOUNTER — Ambulatory Visit (INDEPENDENT_AMBULATORY_CARE_PROVIDER_SITE_OTHER): Payer: Medicare Other | Admitting: Specialist

## 2017-10-27 DIAGNOSIS — G8929 Other chronic pain: Secondary | ICD-10-CM

## 2017-10-27 DIAGNOSIS — R109 Unspecified abdominal pain: Secondary | ICD-10-CM

## 2017-10-27 DIAGNOSIS — Z96641 Presence of right artificial hip joint: Secondary | ICD-10-CM

## 2017-10-27 DIAGNOSIS — K6289 Other specified diseases of anus and rectum: Secondary | ICD-10-CM

## 2017-10-27 NOTE — Progress Notes (Deleted)
Office Visit Note   Patient: Jill Phillips           Date of Birth: 09-May-1956           MRN: [id number] Visit Date: 10/27/2017              Requested by: [name], MD [place], [place] [id number] PCP: [name], MD   Assessment & Plan: Visit Diagnoses:  1. Chronic rectal pain     Plan: ***  Follow-Up Instructions: No Follow-up on file.   Orders:  Orders Placed This Encounter  Procedures  . XR Thoracic Spine 2 View   No orders of the defined types were placed in this encounter.     Procedures: No procedures performed   Clinical Data: No additional findings.   Subjective: No chief complaint on file.   HPI  Review of Systems   Objective: Vital Signs: There were no vitals taken for this visit.  Physical Exam  Ortho Exam  Specialty Comments:  No specialty comments available.  Imaging: No results found.   PMFS History: Patient Active Problem List   Diagnosis Date Noted  . Degenerative disc disease, lumbar 08/01/2015    Priority: High    Class: Chronic  . Spondylolisthesis of lumbar region 08/01/2015    Priority: High    Class: Chronic  . Sacral pain 09/17/2017  . COPD (chronic obstructive pulmonary disease) ([place]) 04/21/2017  . Lower back pain 03/03/2017  . CKD (chronic kidney disease) stage 3, GFR 30-59 ml/min (HCC) 08/07/2016  . GERD (gastroesophageal reflux disease) 08/07/2016  . Prediabetes 08/07/2016  . Chronic respiratory failure ([place]) 07/29/2016  . Hypersomnia 07/29/2016  . Arthritis of carpometacarpal [facility]) joint of left thumb 07/09/2016  . Lump of axilla 05/02/2016  . Pneumothorax on left 01/31/2016  . Chronic, continuous use of opioids 09/06/2015  . Urinary, incontinence, stress female 05/06/2014  . Constipation 05/05/2014  . 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 HTN (hypertension) 05/19/2013  . HLD (hyperlipidemia) 05/19/2013  . Depression 05/19/2013  . Hypothyroidism 05/19/2013  . Tobacco abuse   .  Osteoarthritis of both knees 11/18/2011  . RLS (restless legs syndrome) 11/18/2011   Past Medical History:  Diagnosis Date  . Active smoker   . Anxiety   . Calcifying tendinitis of shoulder   . Chronic back pain   . Chronic pain syndrome   . COPD (chronic obstructive pulmonary disease) ([place])   . Dysthymic disorder   . Emphysema lung ([place])   . GERD (gastroesophageal reflux disease)   . Headache    "weekly maybe" (01/31/2016)  . Heart murmur    for years, nothing to be concerned about  . Herpes genitalia   . History of blood transfusion 1980   related to "back surgery"  . Hyperlipidemia   . Hypertension   . Hypothyroidism   . Lumbago   . Osteoarthrosis, unspecified whether generalized or localized, lower leg   . Pain in joint, upper arm   . Pneumothorax, left 01/31/2016   S/P Left posterior subcostal pain injection on 01/30/2016  . PONV (postoperative nausea and vomiting)    gets nauseous  with longer surgery. Difficuty voiding after surgery  . Postlaminectomy syndrome, thoracic region   . Primary localized osteoarthrosis, lower leg   . Restless leg syndrome   . Sleep apnea    s/p surgery- last sleep study 2011- doesnt use oxygen or machine at night as instructed,.   12/2014- Dr Halford Chessman  reports it is  negative.  . Thyroid disease     Family History  Problem Relation Age of Onset  . Kidney disease Mother   . Heart disease Father   . Anuerysm Brother 29       brain  . Heart disease Brother   . Heart disease Sister 32       s/p CABG  . Hypertension Sister   . Colon cancer Neg Hx     Past Surgical History:  Procedure Laterality Date  . APPENDECTOMY    . BACK SURGERY     18 back surgeries (2 thoracic & 16 lumbar) (01/31/2016)  . DILATION AND CURETTAGE OF UTERUS    . HAMMER TOE SURGERY    . IR RADIOLOGIST EVAL & MGMT  07/21/2017  . JOINT REPLACEMENT    . KNEE ARTHROSCOPY Right   . LAPAROSCOPIC CHOLECYSTECTOMY    . LUMBAR FUSION N/A 08/01/2015   Procedure: Right sided  L1-2 and L2-3 transforaminal lumbar interbody fusion with cages, Extension of posterior fusion T12 to L3, Replaced pedicle screws bilaterally L1-L2 , Replaced left sided pedicle screws L-3. Instrumentation T12 to L3 using local bone graft, Vivigen allograft and cancellous chips;  Surgeon: [name], MD;  Location: [facility];  Service: Orthopedics;  Laterality: N/A;  . LUMBAR LAMINECTOMY/DECOMPRESSION MICRODISCECTOMY N/A 01/28/2014   Procedure: Minimally Invasive Right  L1-2 Microdiscectomy;  Surgeon: [name], MD;  Location: [facility];  Service: Orthopedics;  Laterality: N/A;  . TOTAL HIP ARTHROPLASTY Right   . TOTAL KNEE ARTHROPLASTY  05/29/2012   Procedure: TOTAL KNEE ARTHROPLASTY;  Surgeon: Mcarthur Rossetti, MD;  Location: [facility];  Service: Orthopedics;  Laterality: Right;  Right Total Knee Arthroplasty  . TUBAL LIGATION    . UVULOPALATOPHARYNGOPLASTY    . VAGINAL HYSTERECTOMY     Social History   Occupational History  . Occupation: disability    Comment: back surgeries  Tobacco Use  . Smoking status: Current Every Day Smoker    Packs/day: 1.25    Years: 45.00    Pack years: 56.25    Types: Cigarettes  . Smokeless tobacco: Never Used  . Tobacco comment: form given 12/25/16  Substance and Sexual Activity  . Alcohol use: No    Alcohol/week: 0.0 oz  . Drug use: No    Comment: 01/31/2016 "none since ~ 1980"  . Sexual activity: No    Partners: Male

## 2017-10-27 NOTE — Patient Instructions (Addendum)
Avoid frequent bending and stooping  No lifting greater than 10 lbs. May use ice or moist heat for pain. Weight loss is of benefit. Handicap license is approved. Obtain a bone scan to assess for abnormal uptake in the area of the pelvis and sacrococcygeal spine.

## 2017-10-27 NOTE — Progress Notes (Signed)
Office Visit Note   Patient: Jill Phillips           Date of Birth: Jan 17, 1956           MRN: [id number] Visit Date: 10/27/2017              Requested by: [name], MD [place], [place] [address] PCP: [name], MD   Assessment & Plan: Visit Diagnoses:  1. Chronic rectal pain     Plan:Avoid frequent bending and stooping  No lifting greater than 10 lbs. May use ice or moist heat for pain. Weight loss is of benefit. Handicap license is approved. Obtain a bone scan to assess for abnormal uptake in the area of the pelvis and sacrococcygeal spine.    Follow-Up Instructions: No Follow-up on file.   Orders:  Orders Placed This Encounter  Procedures  . XR Thoracic Spine 2 View   No orders of the defined types were placed in this encounter.     Procedures: No procedures performed   Clinical Data: No additional findings.   Subjective: No chief complaint on file.   62 year old old female with history of lumbar DDD S/P fusion surgery now to the T11-12 level. She has had allmost one year of rectal pain and low sacral and coccygeal area. The pain is  24/7 and the tail bone is off to the side and is curved. She has a hard time getting up and around. She has had only a few days where she did not hurt.1-10 the pain is a 10 all the time.. It hurts to sit on the tailbone, pain also with standing, she goes through more ice. No incontinence, formed she has pain after BMs.     Review of Systems  Constitutional: Negative.   HENT: Negative.   Eyes: Negative.   Respiratory: Negative.   Cardiovascular: Negative.   Gastrointestinal: Negative.   Endocrine: Negative.   Genitourinary: Negative.   Musculoskeletal: Negative.   Skin: Negative.   Allergic/Immunologic: Negative.   Neurological: Negative.   Hematological: Negative.   Psychiatric/Behavioral: Negative.      Objective: Vital Signs: There were no vitals taken for this visit.  Physical Exam    Constitutional: She is oriented to person, place, and time. She appears well-developed and well-nourished.  HENT:  Head: Normocephalic and atraumatic.  Eyes: EOM are normal. Pupils are equal, round, and reactive to light.  Neck: Normal range of motion. Neck supple.  Pulmonary/Chest: Effort normal and breath sounds normal.  Abdominal: Soft. Bowel sounds are normal.  Neurological: She is alert and oriented to person, place, and time.  Skin: Skin is warm and dry.  Psychiatric: She has a normal mood and affect. Her behavior is normal. Judgment and thought content normal.    Back Exam   Tenderness  The patient is experiencing tenderness in the sacroiliac and lumbar.  Range of Motion  Extension: abnormal  Flexion: normal  Lateral bend right: normal  Lateral bend left: normal  Rotation right: normal  Rotation left: normal   Muscle Strength  Right Quadriceps:  5/5  Left Quadriceps:  5/5  Right Hamstrings:  5/5  Left Hamstrings:  5/5   Tests  Straight leg raise right: negative Straight leg raise left: negative  Reflexes  Patellar:  Hyporeflexic normal Achilles:  1/4 normal Babinski's sign: normal   Other  Toe walk: normal Heel walk: normal Sensation: normal Gait: normal  Erythema: no back redness Scars: present  Specialty Comments:  No specialty comments available.  Imaging: No results found.   PMFS History: Patient Active Problem List   Diagnosis Date Noted  . Degenerative disc disease, lumbar 08/01/2015    Priority: High    Class: Chronic  . Spondylolisthesis of lumbar region 08/01/2015    Priority: High    Class: Chronic  . Sacral pain 09/17/2017  . COPD (chronic obstructive pulmonary disease) ([place]) 04/21/2017  . Lower back pain 03/03/2017  . CKD (chronic kidney disease) stage 3, GFR 30-59 ml/min (HCC) 08/07/2016  . GERD (gastroesophageal reflux disease) 08/07/2016  . Prediabetes 08/07/2016  . Chronic respiratory failure ([place]) 07/29/2016  .  Hypersomnia 07/29/2016  . Arthritis of carpometacarpal [facility]) joint of left thumb 07/09/2016  . Lump of axilla 05/02/2016  . Pneumothorax on left 01/31/2016  . Chronic, continuous use of opioids 09/06/2015  . Urinary, incontinence, stress female 05/06/2014  . Constipation 05/05/2014  . 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 HTN (hypertension) 05/19/2013  . HLD (hyperlipidemia) 05/19/2013  . Depression 05/19/2013  . Hypothyroidism 05/19/2013  . Tobacco abuse   . Osteoarthritis of both knees 11/18/2011  . RLS (restless legs syndrome) 11/18/2011   Past Medical History:  Diagnosis Date  . Active smoker   . Anxiety   . Calcifying tendinitis of shoulder   . Chronic back pain   . Chronic pain syndrome   . COPD (chronic obstructive pulmonary disease) ([place])   . Dysthymic disorder   . Emphysema lung ([place])   . GERD (gastroesophageal reflux disease)   . Headache    "weekly maybe" (01/31/2016)  . Heart murmur    for years, nothing to be concerned about  . Herpes genitalia   . History of blood transfusion 1980   related to "back surgery"  . Hyperlipidemia   . Hypertension   . Hypothyroidism   . Lumbago   . Osteoarthrosis, unspecified whether generalized or localized, lower leg   . Pain in joint, upper arm   . Pneumothorax, left 01/31/2016   S/P Left posterior subcostal pain injection on 01/30/2016  . PONV (postoperative nausea and vomiting)    gets nauseous  with longer surgery. Difficuty voiding after surgery  . Postlaminectomy syndrome, thoracic region   . Primary localized osteoarthrosis, lower leg   . Restless leg syndrome   . Sleep apnea    s/p surgery- last sleep study 2011- doesnt use oxygen or machine at night as instructed,.   12/2014- Dr Halford Chessman  reports it is negative.  . Thyroid disease     Family History  Problem Relation Age of Onset  . Kidney disease Mother   . Heart disease Father   . Anuerysm Brother 29       brain  . Heart disease Brother   . Heart disease Sister 47        s/p CABG  . Hypertension Sister   . Colon cancer Neg Hx     Past Surgical History:  Procedure Laterality Date  . APPENDECTOMY    . BACK SURGERY     18 back surgeries (2 thoracic & 16 lumbar) (01/31/2016)  . DILATION AND CURETTAGE OF UTERUS    . HAMMER TOE SURGERY    . IR RADIOLOGIST EVAL & MGMT  07/21/2017  . JOINT REPLACEMENT    . KNEE ARTHROSCOPY Right   . LAPAROSCOPIC CHOLECYSTECTOMY    . LUMBAR FUSION N/A 08/01/2015   Procedure: Right sided L1-2 and L2-3 transforaminal lumbar interbody fusion with cages, Extension of posterior fusion T12 to  L3, Replaced pedicle screws bilaterally L1-L2 , Replaced left sided pedicle screws L-3. Instrumentation T12 to L3 using local bone graft, Vivigen allograft and cancellous chips;  Surgeon: [name], MD;  Location: [facility];  Service: Orthopedics;  Laterality: N/A;  . LUMBAR LAMINECTOMY/DECOMPRESSION MICRODISCECTOMY N/A 01/28/2014   Procedure: Minimally Invasive Right  L1-2 Microdiscectomy;  Surgeon: [name], MD;  Location: [facility];  Service: Orthopedics;  Laterality: N/A;  . TOTAL HIP ARTHROPLASTY Right   . TOTAL KNEE ARTHROPLASTY  05/29/2012   Procedure: TOTAL KNEE ARTHROPLASTY;  Surgeon: Mcarthur Rossetti, MD;  Location: [facility];  Service: Orthopedics;  Laterality: Right;  Right Total Knee Arthroplasty  . TUBAL LIGATION    . UVULOPALATOPHARYNGOPLASTY    . VAGINAL HYSTERECTOMY     Social History   Occupational History  . Occupation: disability    Comment: back surgeries  Tobacco Use  . Smoking status: Current Every Day Smoker    Packs/day: 1.25    Years: 45.00    Pack years: 56.25    Types: Cigarettes  . Smokeless tobacco: Never Used  . Tobacco comment: form given 12/25/16  Substance and Sexual Activity  . Alcohol use: No    Alcohol/week: 0.0 oz  . Drug use: No    Comment: 01/31/2016 "none since ~ 1980"  . Sexual activity: No    Partners: Male

## 2017-10-29 ENCOUNTER — Ambulatory Visit (INDEPENDENT_AMBULATORY_CARE_PROVIDER_SITE_OTHER): Payer: Medicare Other | Admitting: Specialist

## 2017-10-31 ENCOUNTER — Ambulatory Visit: Payer: Medicare Other | Admitting: Registered Nurse

## 2017-11-03 ENCOUNTER — Encounter (HOSPITAL_COMMUNITY): Payer: Medicare Other

## 2017-11-04 ENCOUNTER — Ambulatory Visit (INDEPENDENT_AMBULATORY_CARE_PROVIDER_SITE_OTHER): Payer: Medicare Other | Admitting: Specialist

## 2017-11-05 ENCOUNTER — Other Ambulatory Visit: Payer: Self-pay | Admitting: Physical Medicine & Rehabilitation

## 2017-11-06 ENCOUNTER — Encounter: Payer: Medicare Other | Attending: Physical Medicine & Rehabilitation | Admitting: Registered Nurse

## 2017-11-06 ENCOUNTER — Encounter: Payer: Self-pay | Admitting: Registered Nurse

## 2017-11-06 VITALS — BP 130/84 | HR 87 | Resp 14

## 2017-11-06 DIAGNOSIS — K219 Gastro-esophageal reflux disease without esophagitis: Secondary | ICD-10-CM | POA: Diagnosis not present

## 2017-11-06 DIAGNOSIS — Z9981 Dependence on supplemental oxygen: Secondary | ICD-10-CM | POA: Insufficient documentation

## 2017-11-06 DIAGNOSIS — M961 Postlaminectomy syndrome, not elsewhere classified: Secondary | ICD-10-CM

## 2017-11-06 DIAGNOSIS — G9619 Other disorders of meninges, not elsewhere classified: Secondary | ICD-10-CM | POA: Insufficient documentation

## 2017-11-06 DIAGNOSIS — M5136 Other intervertebral disc degeneration, lumbar region: Secondary | ICD-10-CM

## 2017-11-06 DIAGNOSIS — M170 Bilateral primary osteoarthritis of knee: Secondary | ICD-10-CM | POA: Insufficient documentation

## 2017-11-06 DIAGNOSIS — G8929 Other chronic pain: Secondary | ICD-10-CM

## 2017-11-06 DIAGNOSIS — K6289 Other specified diseases of anus and rectum: Secondary | ICD-10-CM

## 2017-11-06 DIAGNOSIS — M4316 Spondylolisthesis, lumbar region: Secondary | ICD-10-CM

## 2017-11-06 DIAGNOSIS — Z79891 Long term (current) use of opiate analgesic: Secondary | ICD-10-CM

## 2017-11-06 DIAGNOSIS — J449 Chronic obstructive pulmonary disease, unspecified: Secondary | ICD-10-CM | POA: Diagnosis not present

## 2017-11-06 DIAGNOSIS — I10 Essential (primary) hypertension: Secondary | ICD-10-CM | POA: Insufficient documentation

## 2017-11-06 DIAGNOSIS — Z79899 Other long term (current) drug therapy: Secondary | ICD-10-CM | POA: Insufficient documentation

## 2017-11-06 DIAGNOSIS — M62830 Muscle spasm of back: Secondary | ICD-10-CM

## 2017-11-06 DIAGNOSIS — M2170 Unequal limb length (acquired), unspecified site: Secondary | ICD-10-CM | POA: Insufficient documentation

## 2017-11-06 DIAGNOSIS — E039 Hypothyroidism, unspecified: Secondary | ICD-10-CM | POA: Diagnosis not present

## 2017-11-06 DIAGNOSIS — Z5181 Encounter for therapeutic drug level monitoring: Secondary | ICD-10-CM | POA: Diagnosis not present

## 2017-11-06 DIAGNOSIS — M51369 Other intervertebral disc degeneration, lumbar region without mention of lumbar back pain or lower extremity pain: Secondary | ICD-10-CM

## 2017-11-06 DIAGNOSIS — G2581 Restless legs syndrome: Secondary | ICD-10-CM | POA: Diagnosis not present

## 2017-11-06 DIAGNOSIS — E785 Hyperlipidemia, unspecified: Secondary | ICD-10-CM | POA: Insufficient documentation

## 2017-11-06 DIAGNOSIS — G4709 Other insomnia: Secondary | ICD-10-CM

## 2017-11-06 DIAGNOSIS — F411 Generalized anxiety disorder: Secondary | ICD-10-CM | POA: Diagnosis not present

## 2017-11-06 DIAGNOSIS — Z9889 Other specified postprocedural states: Secondary | ICD-10-CM | POA: Diagnosis not present

## 2017-11-06 DIAGNOSIS — Z720 Tobacco use: Secondary | ICD-10-CM | POA: Diagnosis not present

## 2017-11-06 DIAGNOSIS — G894 Chronic pain syndrome: Secondary | ICD-10-CM | POA: Diagnosis not present

## 2017-11-06 DIAGNOSIS — E079 Disorder of thyroid, unspecified: Secondary | ICD-10-CM | POA: Diagnosis not present

## 2017-11-06 DIAGNOSIS — M5126 Other intervertebral disc displacement, lumbar region: Secondary | ICD-10-CM | POA: Diagnosis not present

## 2017-11-06 MED ORDER — FENTANYL 50 MCG/HR TD PT72: 50.0000 ug | MEDICATED_PATCH | TRANSDERMAL | 0 refills | Status: DC

## 2017-11-06 MED ORDER — OXYCODONE-ACETAMINOPHEN 10-325 MG PO TABS: 1.0000 | ORAL_TABLET | Freq: Four times a day (QID) | ORAL | 0 refills | Status: DC | PRN

## 2017-11-06 NOTE — Progress Notes (Signed)
Subjective:    Patient ID: Jill Phillips, female    DOB: 22-Apr-1956, 62 y.o.   MRN: [id number]  HPI: Jill Phillips is a 62 year old female who returns for follow up appointmentfor chronic pain and medication refill. She states her pain is located in her lower back, left hip and rectal pain.  Also states her rectal pain has increased in intensity, Dr. [name] is following.  She rates her pain 9. Her current exercise regime is walking.   Jill Phillips reports she's having personal issues with her daughter and grandson, she's concerned about being evicted due to her living arrangement. She was instructed to speak with her daughter and grandson and to encouraged them to find alternate housing, she states she will speak to them. Emotional support given, Pamphlet for [facility] given.    Jill Phillips Morphine equivalent is 180.00 MME.  She's also prescribed Klonopin. We have reviewed the black box warning again regarding using opioids and benzodiazepines. I highlighted the dangers of using these drugs together and discussed the adverse events including respiratory suppression, overdose, cognitive impairment and importance of  compliance with current regimen. She verbalizes understanding, we will continue to monitor and adjust as indicated.     Oral Swab  was performed on 07/16/2017, it was positive for Fentanyl, see note for details. Oral Swab was performed today.  On 01/23/2017, 11/28/2016 and 08/08/2016  UDS was Performed and Consistent.  UDS ordered Today.   Pain Inventory Average Pain 9 Pain Right Now 9 My pain is sharp and burning  In the last 24 hours, has pain interfered with the following? General activity 10 Relation with others 9 Enjoyment of life 10 What TIME of day is your pain at its worst? daytime, evening, and night Sleep (in general) Fair  Pain is worse with: walking, bending, sitting, inactivity and standing Pain improves with: heat/ice and medication Relief from Meds:  3  Mobility walk without assistance ability to climb steps?  no do you drive?  yes Do you have any goals in this area?  no  Function Do you have any goals in this area?  no  Neuro/Psych bladder control problems numbness trouble walking spasms  Prior Studies Any changes since last visit?  no  Physicians involved in your care Any changes since last visit?  no   Family History  Problem Relation Age of Onset  . Kidney disease Mother   . Heart disease Father   . Anuerysm Brother 29       brain  . Heart disease Brother   . Heart disease Sister 46       s/p CABG  . Hypertension Sister   . Colon cancer Neg Hx    Social History   Socioeconomic History  . Marital status: Divorced    Spouse name: n/a  . Number of children: 2  . Years of education: 12+  . Highest education level: None  Social Needs  . Financial resource strain: None  . Food insecurity - worry: None  . Food insecurity - inability: None  . Transportation needs - medical: None  . Transportation needs - non-medical: None  Occupational History  . Occupation: disability    Comment: back surgeries  Tobacco Use  . Smoking status: Current Every Day Smoker    Packs/day: 1.25    Years: 45.00    Pack years: 56.25    Types: Cigarettes  . Smokeless tobacco: Never Used  . Tobacco comment: form given 12/25/16  Substance and Sexual Activity  . Alcohol use: No    Alcohol/week: 0.0 oz  . Drug use: No    Comment: 01/31/2016 "none since ~ 1980"  . Sexual activity: No    Partners: Male  Other Topics Concern  . None  Social History Narrative   Lives alone.  One daughter is local, but is getting ready to move to [place], where her children live with their father.  The other daughter lives near [place], [place].   Past Surgical History:  Procedure Laterality Date  . APPENDECTOMY    . BACK SURGERY     18 back surgeries (2 thoracic & 16 lumbar) (01/31/2016)  . DILATION AND CURETTAGE OF UTERUS    . HAMMER TOE  SURGERY    . IR RADIOLOGIST EVAL & MGMT  07/21/2017  . JOINT REPLACEMENT    . KNEE ARTHROSCOPY Right   . LAPAROSCOPIC CHOLECYSTECTOMY    . LUMBAR FUSION N/A 08/01/2015   Procedure: Right sided L1-2 and L2-3 transforaminal lumbar interbody fusion with cages, Extension of posterior fusion T12 to L3, Replaced pedicle screws bilaterally L1-L2 , Replaced left sided pedicle screws L-3. Instrumentation T12 to L3 using local bone graft, Vivigen allograft and cancellous chips;  Surgeon: [name], MD;  Location: [facility];  Service: Orthopedics;  Laterality: N/A;  . LUMBAR LAMINECTOMY/DECOMPRESSION MICRODISCECTOMY N/A 01/28/2014   Procedure: Minimally Invasive Right  L1-2 Microdiscectomy;  Surgeon: [name], MD;  Location: [facility];  Service: Orthopedics;  Laterality: N/A;  . TOTAL HIP ARTHROPLASTY Right   . TOTAL KNEE ARTHROPLASTY  05/29/2012   Procedure: TOTAL KNEE ARTHROPLASTY;  Surgeon: Mcarthur Rossetti, MD;  [facility];  Service: Orthopedics;  Laterality: Right;  Right Total Knee Arthroplasty  . TUBAL LIGATION    . UVULOPALATOPHARYNGOPLASTY    . VAGINAL HYSTERECTOMY     Past Medical History:  Diagnosis Date  . Active smoker   . Anxiety   . Calcifying tendinitis of shoulder   . Chronic back pain   . Chronic pain syndrome   . COPD (chronic obstructive pulmonary disease) ([place])   . Dysthymic disorder   . Emphysema lung ([place])   . GERD (gastroesophageal reflux disease)   . Headache    "weekly maybe" (01/31/2016)  . Heart murmur    for years, nothing to be concerned about  . Herpes genitalia   . History of blood transfusion 1980   related to "back surgery"  . Hyperlipidemia   . Hypertension   . Hypothyroidism   . Lumbago   . Osteoarthrosis, unspecified whether generalized or localized, lower leg   . Pain in joint, upper arm   . Pneumothorax, left 01/31/2016   S/P Left posterior subcostal pain injection on 01/30/2016  . PONV (postoperative nausea and vomiting)    gets nauseous   with longer surgery. Difficuty voiding after surgery  . Postlaminectomy syndrome, thoracic region   . Primary localized osteoarthrosis, lower leg   . Restless leg syndrome   . Sleep apnea    s/p surgery- last sleep study 2011- doesnt use oxygen or machine at night as instructed,.   12/2014- Dr Halford Chessman  reports it is negative.  . Thyroid disease    BP 130/84 (BP Location: Left Arm, Patient Position: Sitting, Cuff Size: Normal)   Pulse 87   Resp 14   SpO2 91%   Opioid Risk Score:  6 Fall Risk Score:  `1  Depression screen PHQ 2/9  Depression screen [facility] 2/9 10/10/2017 09/10/2017 06/13/2017 05/15/2016  05/02/2016 12/07/2015 11/15/2015  Decreased Interest 1 1 0 0 0 0 0  Down, Depressed, Hopeless 1 1 0 0 0 0 0  PHQ - 2 Score 2 2 0 0 0 0 0  Some recent data might be hidden      Review of Systems  Constitutional: Negative.   HENT: Negative.   Eyes: Negative.   Respiratory: Positive for shortness of breath.   Cardiovascular: Negative.   Gastrointestinal: Positive for constipation and nausea.  Endocrine: Negative.   Genitourinary: Positive for dysuria.       UTI- went to ED yesterday  Musculoskeletal: Positive for back pain and myalgias.  Skin: Negative.   Allergic/Immunologic: Negative.   Neurological: Negative.   Hematological: Negative.   Psychiatric/Behavioral: Negative.   All other systems reviewed and are negative.      Objective:   Physical Exam  Constitutional: She is oriented to person, place, and time. She appears well-developed and well-nourished.  HENT:  Head: Normocephalic and atraumatic.  Neck: Normal range of motion. Neck supple.  Cardiovascular: Normal rate and regular rhythm.  Pulmonary/Chest: Effort normal and breath sounds normal.  Musculoskeletal:  Normal Muscle Bulk and Muscle Testing Reveals: Upper Extremities: Full ROM and Muscle Strength 5/5 Lumbar Paraspinal Tenderness: L-3-L-5 Left Greater Trochanter Tenderness Lower Extremities: Full l ROM and Muscle  Strength 5/5 Arises from Table with ease Narrow Based gait   Neurological: She is alert and oriented to person, place, and time.  Skin: Skin is warm and dry.  Psychiatric: She has a normal mood and affect.  Nursing note and vitals reviewed.         Assessment & Plan:  1. Lumbar Degenerative Disc/Chronic lumbar spine pain/post-lami syndrome: 11/06/2017 Refilled: Fentanyl 50 MCG one patch every three days #10 and Oxycodone 10/325 mg one tablet every 6 hours as needed for pain #120. We will continue the opioid monitoring program, this consists of regular clinic visits, examinations, urine drug screen, pill counts as well as use of [place] Controlled Substance Reporting System. 2. Chronic Thoracic Pain: :Continue current medication regime and Continue to Monitor.11/06/17 3. Osteoarthritis of left knee: No complaints today. S/P Cortisone Injection on 05/12/2017 by Dr. [name]. Continue with Heat, exercise and voltaren. 11/06/2017 4. Rotator cuff syndrome/subacromial bursitis: No complaints voiced today. Continue to Monitor. 11/06/2017. 5. Restless legs syndrome: Continue Requip.Continue to monitor. 11/06/2017 6. Tobacco Abuse: Continue Smoking Cessation: PCP prescribed Nicotine Patches : 11/06/2017 7. Muscle Spasm: Continue Tizanidine. 11/06/2017 8. Insomnia: Continue Seroquel. 11/06/2017 9. Neuropathic Pain: Continue Gabapentin.11/06/2017 10.Anxiety: Continue Klonopin. 11/06/2017 11 Chronic Rectal Pain: No complaints Today. 11/06/2017  20 minutes of face to face patient care time was spent during this visit. All questions were encouraged and answered.  F/u in 1 month

## 2017-11-07 ENCOUNTER — Encounter (HOSPITAL_COMMUNITY)
Admission: RE | Admit: 2017-11-07 | Discharge: 2017-11-07 | Disposition: A | Discharge status: Home / Self Care | Payer: Medicare Other | Source: Ambulatory Visit | Attending: Specialist | Admitting: Specialist

## 2017-11-07 ENCOUNTER — Other Ambulatory Visit: Payer: Self-pay | Admitting: Registered Nurse

## 2017-11-07 DIAGNOSIS — R109 Unspecified abdominal pain: Secondary | ICD-10-CM | POA: Insufficient documentation

## 2017-11-07 DIAGNOSIS — Z471 Aftercare following joint replacement surgery: Secondary | ICD-10-CM | POA: Diagnosis not present

## 2017-11-07 DIAGNOSIS — Z96641 Presence of right artificial hip joint: Secondary | ICD-10-CM | POA: Diagnosis not present

## 2017-11-07 MED ORDER — TECHNETIUM TC 99M MEDRONATE IV KIT
21.9000 | PACK | Freq: Once | INTRAVENOUS | Status: AC | PRN
  Administered 2017-11-07: 21.9 via INTRAVENOUS

## 2017-11-11 ENCOUNTER — Other Ambulatory Visit (INDEPENDENT_AMBULATORY_CARE_PROVIDER_SITE_OTHER): Payer: Self-pay | Admitting: Specialist

## 2017-11-12 DIAGNOSIS — R079 Chest pain, unspecified: Secondary | ICD-10-CM | POA: Diagnosis not present

## 2017-11-13 LAB — TOXASSURE SELECT,+ANTIDEPR,UR

## 2017-11-17 ENCOUNTER — Telehealth: Payer: Self-pay | Admitting: *Deleted

## 2017-11-17 ENCOUNTER — Other Ambulatory Visit: Payer: Self-pay | Admitting: Internal Medicine

## 2017-11-17 NOTE — Telephone Encounter (Addendum)
Urine drug screen is inconsistent.  Her prescribed narcotic medications are present but she also has unprescribed hydrocodone in her urine. The last rx for hydrocodone was in October 2018 and she should not have access to old rx when new medication is prescribed. She did not report taking this medication.  Her OD score on PMP aware is 830.

## 2017-11-18 NOTE — Telephone Encounter (Signed)
Placed a call to [name] regarding her UDS, she states she had dropped her medication and found it under the bed and thought she was taking herOxycodone. She states it looked like her Oxycodone. According to the PMP -Aware Web Site last prescription of Hydrocodone was on  06/2017. We reviewed the Narcotic Contract, education given regarding when her medication is changed she is to continue with current medication. There is no documentation her hydrocodone was destroyed, she verbalizes understanding. Also realizes the above will be discussed with Dr. Naaman Plummer  And Dr. Naaman Plummer will make his decision regarding the above. She verbalizes understanding.

## 2017-11-18 NOTE — Telephone Encounter (Signed)
Have we checked pharmacies? Ask patient where it's from please as well. thx

## 2017-11-24 ENCOUNTER — Other Ambulatory Visit: Payer: Self-pay | Admitting: Internal Medicine

## 2017-11-24 DIAGNOSIS — I10 Essential (primary) hypertension: Secondary | ICD-10-CM

## 2017-11-28 ENCOUNTER — Telehealth: Payer: Self-pay | Admitting: *Deleted

## 2017-11-28 NOTE — Telephone Encounter (Signed)
Formal warning letter will be mailed to Jill Phillips about taking medications that are not CURRENTLY PRESCRIBED.

## 2017-11-28 NOTE — Telephone Encounter (Signed)
Jill Phillips called stating that her rectal pain is so bad she is staying in the bed and only getting out to use the bathroom.  She states her pain meds have never touched it.  She is asking if she can take a half tablet more of her pain medication.  She is seeing gynecologist about this but appt is not until 12/09/17. I discussed with Zella Ball NP and she is absolutely not to take more medication. She is to receive a warning letter about previous medication taken that is not currently prescribed "because she found two tablets under the bed and took them".  The hydrocodone showed up in her urine test. Today she reports what happened was that she spilled her medication dose that was laid out on tray by her bed after she got up to get something to drink.  She had her daughter sweep under the bed and she thought the medication was what she was taking, so she took it. (even though she should have noticed  the number of pills were more than she usually takes).  I  reinforced that she is already on 180 MME and she is not to increase her medication or take anything that is not currently prescribed as it will result in discharge and reiterated pain relief is not always possible and escalating medication can result in respiratory depression and result in OD. She says she understands.

## 2017-11-30 NOTE — Patient Instructions (Addendum)
  Test(s) ordered today. Your results will be released to [facility] (or called to you) after review, usually within 72hours after test completion. If any changes need to be made, you will be notified at that same time.   Pneumonia immunization administered today.   Medications reviewed and updated.   No changes recommended at this time.   A referral was ordered for nutrition  Please followup in 6 months

## 2017-11-30 NOTE — Progress Notes (Signed)
Subjective:    Patient ID: Jill Phillips, female    DOB: [birth year], 62 y.o.   MRN: [id number]  HPI The patient is here for follow up.  Hypertension: She is taking her medication daily. She is compliant with a low sodium diet.  She denies chest pain regularly, but did have one episode that was evaluated by EMS and she feels it was related to not taking her clonazepam for a couple of days.  She does experience some shortness of breath, but this is chronic and unchanged.  She denies other chest pain, palpitations, edema and regular headaches. She is not exercising regularly.  She does not monitor her blood pressure at home.    Hyperlipidemia: She is taking her medication daily. She is compliant with a low fat/cholesterol diet. She is not exercising regularly. She denies myalgias.   Diabetes: This is a new diagnosis based on her blood work months ago.  She is compliant with a low sugar/carbohydrate diet.  She is not exercising regularly.  Hypothyroidism:  She is taking her medication daily.  She denies any recent changes in energy or weight that are unexplained.   GERD:  She is taking her medication daily as prescribed.  She denies any GERD symptoms frequently and feels her GERD is well controlled.   CKD:  She takes diclofenac daily, which is prescribed by pain management.  She does not drink much water during the day.   Rectal pain:  It has been constant since 10/2015.  She had a normal colonoscopy in 10/2016.  She had back surgery in 2016.  She plans on seeing gyn-she is not seeing them in several years.  She is following with pain management and has discussed this with them.    Medications and allergies reviewed with patient and updated if appropriate.  Patient Active Problem List   Diagnosis Date Noted  . Sacral pain 09/17/2017  . COPD (chronic obstructive pulmonary disease) ([place]) 04/21/2017  . Lower back pain 03/03/2017  . CKD (chronic kidney disease) stage 3, GFR 30-59 ml/min (HCC)  08/07/2016  . GERD (gastroesophageal reflux disease) 08/07/2016  . Prediabetes 08/07/2016  . Chronic respiratory failure ([place]) 07/29/2016  . Hypersomnia 07/29/2016  . Arthritis of carpometacarpal [facility]) joint of left thumb 07/09/2016  . Lump of axilla 05/02/2016  . Pneumothorax on left 01/31/2016  . Chronic, continuous use of opioids 09/06/2015  . Degenerative disc disease, lumbar 08/01/2015    Class: Chronic  . Spondylolisthesis of lumbar region 08/01/2015    Class: Chronic  . Urinary, incontinence, stress female 05/06/2014  . Constipation 05/05/2014  . 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 HTN (hypertension) 05/19/2013  . HLD (hyperlipidemia) 05/19/2013  . Depression 05/19/2013  . Hypothyroidism 05/19/2013  . Tobacco abuse   . Osteoarthritis of both knees 11/18/2011  . RLS (restless legs syndrome) 11/18/2011    Current Outpatient Medications on File Prior to Visit  Medication Sig Dispense Refill  . acetaminophen (TYLENOL) 500 MG tablet Take 1,000 mg by mouth every 6 (six) hours as needed for headache.     . albuterol (PROAIR HFA) 108 (90 Base) MCG/ACT inhaler INHALE 1 PUFF INTO THE LUNGS EVERY 6 HOURS AS NEEDED FOR WHEEZING OR SHORTNESS OF BREATH.NEED OFFICE VISIT FOR FURTHER REFILLS 8.5 g 0  . amLODipine (NORVASC) 10 MG tablet TAKE 1 TABLET BY MOUTH DAILY. 90 tablet 1  . atorvastatin (LIPITOR) 20 MG tablet Take 1 tablet (20 mg total) by mouth daily. 90 tablet 3  . benazepril (LOTENSIN)  40 MG tablet TAKE 1 TABLET BY MOUTH DAILY. 90 tablet 1  . clonazePAM (KLONOPIN) 2 MG tablet TAKE 1 TABLET BY MOUTH 2 TIMES A DAY AS NEEDED. 60 tablet 2  . diclofenac (VOLTAREN) 75 MG EC tablet TAKE 1 TABLET BY MOUTH 2 TIMES A DAY WITH A MEAL. 60 tablet 3  . fentaNYL (DURAGESIC - DOSED MCG/HR) 50 MCG/HR Place 1 patch (50 mcg total) onto the skin every 3 (three) days. 10 patch 0  . fluconazole (DIFLUCAN) 150 MG tablet   0  . gabapentin (NEURONTIN) 300 MG capsule Take 1 capsule (300 mg total) by mouth 4 (four)  times daily. 120 capsule 5  . hydrochlorothiazide (HYDRODIURIL) 12.5 MG tablet TAKE 1 TABLET BY MOUTH DAILY. 90 tablet 1  . levothyroxine (SYNTHROID, LEVOTHROID) 88 MCG tablet Take 1 tablet (88 mcg total) by mouth daily. 90 tablet 1  . nicotine (NICODERM CQ) 21 mg/24hr patch Place 1 patch (21 mg total) onto the skin daily. 28 patch 1  . omeprazole (PRILOSEC) 40 MG capsule Take 1 capsule (40 mg total) by mouth 2 (two) times daily before a meal. 180 capsule 3  . oxyCODONE-acetaminophen (PERCOCET) 10-325 MG tablet Take 1 tablet by mouth every 6 (six) hours as needed for pain. 120 tablet 0  . QUEtiapine (SEROQUEL) 100 MG tablet TAKE 1 TABLET BY MOUTH AT BEDTIME. 30 tablet 2  . rOPINIRole (REQUIP) 0.5 MG tablet TAKE 2 TABLETS AT BEDTIME. 60 tablet 2  . Sennosides (SENNA LAX PO) As directed    . SPIRIVA HANDIHALER 18 MCG inhalation capsule PLACE 1 CAPSULE INTO INHALER AND INHALE ONCE DAILY. 30 capsule 0  . tiZANidine (ZANAFLEX) 4 MG tablet TAKE 1 TO 1 AND 1/2 TABLETS BY MOUTH EVERY 6 HOURS AS NEEDED FOR MUSCLE PAIN/ SPASMS 150 tablet 3  . valACYclovir (VALTREX) 500 MG tablet TAKE 1 TABLET BY MOUTH DAILY. 30 tablet 5  . venlafaxine XR (EFFEXOR-XR) 150 MG 24 hr capsule TAKE 2 CAPSULES BY MOUTH DAILY. 60 capsule 2  . Vitamin D, Ergocalciferol, (DRISDOL) 50000 units CAPS capsule Take 1 capsule (50,000 Units total) by mouth every 7 (seven) days. 8 capsule 0   Current Facility-Administered Medications on File Prior to Visit  Medication Dose Route Frequency Provider Last Rate Last Dose  . calcitonin (salmon) (MIACALCIN/FORTICAL) nasal spray 1 spray  1 spray Alternating Nares Daily [name], MD        Past Medical History:  Diagnosis Date  . Active smoker   . Anxiety   . Calcifying tendinitis of shoulder   . Chronic back pain   . Chronic pain syndrome   . COPD (chronic obstructive pulmonary disease) ([place])   . Dysthymic disorder   . Emphysema lung ([place])   . GERD (gastroesophageal reflux disease)    . Headache    "weekly maybe" (01/31/2016)  . Heart murmur    for years, nothing to be concerned about  . Herpes genitalia   . History of blood transfusion 1980   related to "back surgery"  . Hyperlipidemia   . Hypertension   . Hypothyroidism   . Lumbago   . Osteoarthrosis, unspecified whether generalized or localized, lower leg   . Pain in joint, upper arm   . Pneumothorax, left 01/31/2016   S/P Left posterior subcostal pain injection on 01/30/2016  . PONV (postoperative nausea and vomiting)    gets nauseous  with longer surgery. Difficuty voiding after surgery  . Postlaminectomy syndrome, thoracic region   . Primary localized osteoarthrosis,  lower leg   . Restless leg syndrome   . Sleep apnea    s/p surgery- last sleep study 2011- doesnt use oxygen or machine at night as instructed,.   12/2014- Dr Halford Chessman  reports it is negative.  . Thyroid disease     Past Surgical History:  Procedure Laterality Date  . APPENDECTOMY    . BACK SURGERY     18 back surgeries (2 thoracic & 16 lumbar) (01/31/2016)  . DILATION AND CURETTAGE OF UTERUS    . HAMMER TOE SURGERY    . IR RADIOLOGIST EVAL & MGMT  07/21/2017  . JOINT REPLACEMENT    . KNEE ARTHROSCOPY Right   . LAPAROSCOPIC CHOLECYSTECTOMY    . LUMBAR FUSION N/A 08/01/2015   Procedure: Right sided L1-2 and L2-3 transforaminal lumbar interbody fusion with cages, Extension of posterior fusion T12 to L3, Replaced pedicle screws bilaterally L1-L2 , Replaced left sided pedicle screws L-3. Instrumentation T12 to L3 using local bone graft, Vivigen allograft and cancellous chips;  Surgeon: [name], MD;  Location: [place];  Service: Orthopedics;  Laterality: N/A;  . LUMBAR LAMINECTOMY/DECOMPRESSION MICRODISCECTOMY N/A 01/28/2014   Procedure: Minimally Invasive Right  L1-2 Microdiscectomy;  Surgeon: [name], MD;  Location: [place];  Service: Orthopedics;  Laterality: N/A;  . TOTAL HIP ARTHROPLASTY Right   . TOTAL KNEE ARTHROPLASTY  05/29/2012    Procedure: TOTAL KNEE ARTHROPLASTY;  Surgeon: Mcarthur Rossetti, MD;  Location: [facility];  Service: Orthopedics;  Laterality: Right;  Right Total Knee Arthroplasty  . TUBAL LIGATION    . UVULOPALATOPHARYNGOPLASTY    . VAGINAL HYSTERECTOMY      Social History   Socioeconomic History  . Marital status: Divorced    Spouse name: n/a  . Number of children: 2  . Years of education: 12+  . Highest education level: Not on file  Occupational History  . Occupation: disability    Comment: back surgeries  Social Needs  . Financial resource strain: Not on file  . Food insecurity:    Worry: Not on file    Inability: Not on file  . Transportation needs:    Medical: Not on file    Non-medical: Not on file  Tobacco Use  . Smoking status: Current Every Day Smoker    Packs/day: 1.25    Years: 45.00    Pack years: 56.25    Types: Cigarettes  . Smokeless tobacco: Never Used  . Tobacco comment: form given 12/25/16  Substance and Sexual Activity  . Alcohol use: No    Alcohol/week: 0.0 oz  . Drug use: No    Types: Marijuana    Comment: 01/31/2016 "none since ~ 1980"  . Sexual activity: Never    Partners: Male  Lifestyle  . Physical activity:    Days per week: Not on file    Minutes per session: Not on file  . Stress: Not on file  Relationships  . Social connections:    Talks on phone: Not on file    Gets together: Not on file    Attends religious service: Not on file    Active member of club or organization: Not on file    Attends meetings of clubs or organizations: Not on file    Relationship status: Not on file  Other Topics Concern  . Not on file  Social History Narrative   Lives alone.  One daughter is local, but is getting ready to move to [place], where her children live with their  father.  The other daughter lives near [place], [place].    Family History  Problem Relation Age of Onset  . Kidney disease Mother   . Heart disease Father   . Anuerysm Brother 29       brain    . Heart disease Brother   . Heart disease Sister 2       s/p CABG  . Hypertension Sister   . Colon cancer Neg Hx     Review of Systems  Constitutional: Negative for chills and fever.  Respiratory: Positive for shortness of breath (sometimes) and wheezing (sometimes). Negative for cough.   Cardiovascular: Positive for chest pain (once - evaluated by EMS). Negative for palpitations and leg swelling.  Gastrointestinal: Positive for abdominal pain (intermittent suprapubic discomfort).       GERD controlled  Genitourinary: Negative for dysuria and hematuria.  Neurological: Positive for numbness (in places in right leg from prior back surgery). Negative for dizziness, light-headedness and headaches.       Objective:   Vitals:   12/02/17 1122  BP: 136/80  Pulse: 96  Resp: 16  Temp: 98.2 F (36.8 C)  SpO2: 93%   BP Readings from Last 3 Encounters:  12/02/17 136/80  12/01/17 (!) 148/85  11/06/17 130/84   Wt Readings from Last 3 Encounters:  12/02/17 154 lb (69.9 kg)  12/01/17 155 lb (70.3 kg)  10/13/17 156 lb 0.6 oz (70.8 kg)   Body mass index is 26.43 kg/m.   Physical Exam    Constitutional: Appears well-developed and well-nourished. No distress.  HENT:  Head: Normocephalic and atraumatic.  Neck: Neck supple. No tracheal deviation present. No thyromegaly present.  No cervical lymphadenopathy Cardiovascular: Normal rate, regular rhythm and normal heart sounds.   No murmur heard. No carotid bruit .  No edema Pulmonary/Chest: Effort normal and breath sounds normal. No respiratory distress. No has no wheezes. No rales.  Skin: Skin is warm and dry. Not diaphoretic.  Psychiatric: Normal mood and affect. Behavior is normal.      Assessment & Plan:    See Problem List for Assessment and Plan of chronic medical problems.

## 2017-12-01 ENCOUNTER — Encounter: Payer: Self-pay | Admitting: Registered Nurse

## 2017-12-01 ENCOUNTER — Other Ambulatory Visit: Payer: Self-pay

## 2017-12-01 ENCOUNTER — Encounter: Payer: Medicare Other | Attending: Physical Medicine & Rehabilitation | Admitting: Registered Nurse

## 2017-12-01 VITALS — BP 148/85 | HR 89 | Ht 64.0 in | Wt 155.0 lb

## 2017-12-01 DIAGNOSIS — M62830 Muscle spasm of back: Secondary | ICD-10-CM | POA: Diagnosis not present

## 2017-12-01 DIAGNOSIS — G8929 Other chronic pain: Secondary | ICD-10-CM

## 2017-12-01 DIAGNOSIS — M2170 Unequal limb length (acquired), unspecified site: Secondary | ICD-10-CM | POA: Insufficient documentation

## 2017-12-01 DIAGNOSIS — M5126 Other intervertebral disc displacement, lumbar region: Secondary | ICD-10-CM | POA: Insufficient documentation

## 2017-12-01 DIAGNOSIS — G9619 Other disorders of meninges, not elsewhere classified: Secondary | ICD-10-CM | POA: Diagnosis not present

## 2017-12-01 DIAGNOSIS — M5136 Other intervertebral disc degeneration, lumbar region: Secondary | ICD-10-CM

## 2017-12-01 DIAGNOSIS — M170 Bilateral primary osteoarthritis of knee: Secondary | ICD-10-CM | POA: Insufficient documentation

## 2017-12-01 DIAGNOSIS — K6289 Other specified diseases of anus and rectum: Secondary | ICD-10-CM | POA: Diagnosis not present

## 2017-12-01 DIAGNOSIS — Z5181 Encounter for therapeutic drug level monitoring: Secondary | ICD-10-CM | POA: Diagnosis not present

## 2017-12-01 DIAGNOSIS — Z720 Tobacco use: Secondary | ICD-10-CM

## 2017-12-01 DIAGNOSIS — J449 Chronic obstructive pulmonary disease, unspecified: Secondary | ICD-10-CM | POA: Diagnosis not present

## 2017-12-01 DIAGNOSIS — E039 Hypothyroidism, unspecified: Secondary | ICD-10-CM | POA: Insufficient documentation

## 2017-12-01 DIAGNOSIS — Z79891 Long term (current) use of opiate analgesic: Secondary | ICD-10-CM | POA: Diagnosis not present

## 2017-12-01 DIAGNOSIS — G894 Chronic pain syndrome: Secondary | ICD-10-CM | POA: Diagnosis not present

## 2017-12-01 DIAGNOSIS — Z9889 Other specified postprocedural states: Secondary | ICD-10-CM | POA: Diagnosis not present

## 2017-12-01 DIAGNOSIS — G2581 Restless legs syndrome: Secondary | ICD-10-CM | POA: Diagnosis not present

## 2017-12-01 DIAGNOSIS — I10 Essential (primary) hypertension: Secondary | ICD-10-CM | POA: Diagnosis not present

## 2017-12-01 DIAGNOSIS — E785 Hyperlipidemia, unspecified: Secondary | ICD-10-CM | POA: Diagnosis not present

## 2017-12-01 DIAGNOSIS — E079 Disorder of thyroid, unspecified: Secondary | ICD-10-CM | POA: Insufficient documentation

## 2017-12-01 DIAGNOSIS — Z9981 Dependence on supplemental oxygen: Secondary | ICD-10-CM | POA: Diagnosis not present

## 2017-12-01 DIAGNOSIS — K219 Gastro-esophageal reflux disease without esophagitis: Secondary | ICD-10-CM | POA: Diagnosis not present

## 2017-12-01 DIAGNOSIS — M51369 Other intervertebral disc degeneration, lumbar region without mention of lumbar back pain or lower extremity pain: Secondary | ICD-10-CM

## 2017-12-01 DIAGNOSIS — F411 Generalized anxiety disorder: Secondary | ICD-10-CM | POA: Diagnosis not present

## 2017-12-01 DIAGNOSIS — Z79899 Other long term (current) drug therapy: Secondary | ICD-10-CM | POA: Insufficient documentation

## 2017-12-01 DIAGNOSIS — M4316 Spondylolisthesis, lumbar region: Secondary | ICD-10-CM | POA: Diagnosis not present

## 2017-12-01 DIAGNOSIS — G4709 Other insomnia: Secondary | ICD-10-CM | POA: Diagnosis not present

## 2017-12-01 DIAGNOSIS — M961 Postlaminectomy syndrome, not elsewhere classified: Secondary | ICD-10-CM | POA: Diagnosis not present

## 2017-12-01 MED ORDER — FENTANYL 50 MCG/HR TD PT72: 50.0000 ug | MEDICATED_PATCH | TRANSDERMAL | 0 refills | Status: DC

## 2017-12-01 MED ORDER — OXYCODONE-ACETAMINOPHEN 10-325 MG PO TABS: 1.0000 | ORAL_TABLET | Freq: Four times a day (QID) | ORAL | 0 refills | Status: DC | PRN

## 2017-12-01 NOTE — Progress Notes (Signed)
Subjective:    Patient ID: Jill Phillips, female    DOB: [birth year], 62 y.o.   MRN: [id number]  HPI: [name] is a 62 year old female who returns for follow up appointment and medication refill. She states her pain is located in her lower back and rectal pain. Dr. [name] is following her rectal pain she reports. Jill Phillips states her rectal pain has increased in intensity and she's not receiving any relief from the rectal pain. We discuss different modalities of treatments, we discussed Tapentadol and her MME, she will think about her treatment options and call office with her decision.   Jill Phillips Morphine Equivalent is 198.00 MME. She is also prescribe Klonopin.  We have discussed the black box warning of using opioids and benzodiazepines. I highlighted the dangers of using these drugs together and discussed the adverse events including respiratory suppression, overdose, cognitive impairment and importance of  compliance with current regimen. She  verbalizes understanding, we will continue to monitor and adjust as indicated.   Last Oral Swab was Performed on 09/10/17 it was consistent for prescribe medication, see note for details.    Pain Inventory Average Pain 7 Pain Right Now 7 My pain is intermittent and burning  In the last 24 hours, has pain interfered with the following? General activity 2 Relation with others 0 Enjoyment of life  What TIME of day is your pain at its worst? morning evening  Sleep (in general) Good  Pain is worse with: sitting, standing and some activites Pain improves with: medication Relief from Meds: 3  Mobility walk without assistance how many minutes can you walk? 15 ability to climb steps?  no do you drive?  yes  Function disabled: date disabled n/a I need assistance with the following:  meal prep and household duties  Neuro/Psych numbness  Prior Studies Any changes since last visit?  no  Physicians involved in your care Any changes  since last visit?  no   Family History  Problem Relation Age of Onset  . Kidney disease Mother   . Heart disease Father   . Anuerysm Brother 29       brain  . Heart disease Brother   . Heart disease Sister 83       s/p CABG  . Hypertension Sister   . Colon cancer Neg Hx    Social History   Socioeconomic History  . Marital status: Divorced    Spouse name: n/a  . Number of children: 2  . Years of education: 12+  . Highest education level: Not on file  Occupational History  . Occupation: disability    Comment: back surgeries  Social Needs  . Financial resource strain: Not on file  . Food insecurity:    Worry: Not on file    Inability: Not on file  . Transportation needs:    Medical: Not on file    Non-medical: Not on file  Tobacco Use  . Smoking status: Current Every Day Smoker    Packs/day: 1.25    Years: 45.00    Pack years: 56.25    Types: Cigarettes  . Smokeless tobacco: Never Used  . Tobacco comment: form given 12/25/16  Substance and Sexual Activity  . Alcohol use: No    Alcohol/week: 0.0 oz  . Drug use: No    Types: Marijuana    Comment: 01/31/2016 "none since ~ 1980"  . Sexual activity: Never    Partners: Male  Lifestyle  . Physical  activity:    Days per week: Not on file    Minutes per session: Not on file  . Stress: Not on file  Relationships  . Social connections:    Talks on phone: Not on file    Gets together: Not on file    Attends religious service: Not on file    Active member of club or organization: Not on file    Attends meetings of clubs or organizations: Not on file    Relationship status: Not on file  Other Topics Concern  . Not on file  Social History Narrative   Lives alone.  One daughter is local, but is getting ready to move to [place], where her children live with their father.  The other daughter lives near [place], [place].   Past Surgical History:  Procedure Laterality Date  . APPENDECTOMY    . BACK SURGERY     18 back  surgeries (2 thoracic & 16 lumbar) (01/31/2016)  . DILATION AND CURETTAGE OF UTERUS    . HAMMER TOE SURGERY    . IR RADIOLOGIST EVAL & MGMT  07/21/2017  . JOINT REPLACEMENT    . KNEE ARTHROSCOPY Right   . LAPAROSCOPIC CHOLECYSTECTOMY    . LUMBAR FUSION N/A 08/01/2015   Procedure: Right sided L1-2 and L2-3 transforaminal lumbar interbody fusion with cages, Extension of posterior fusion T12 to L3, Replaced pedicle screws bilaterally L1-L2 , Replaced left sided pedicle screws L-3. Instrumentation T12 to L3 using local bone graft, Vivigen allograft and cancellous chips;  Surgeon: [name], MD;  Location: [place];  Service: Orthopedics;  Laterality: N/A;  . LUMBAR LAMINECTOMY/DECOMPRESSION MICRODISCECTOMY N/A 01/28/2014   Procedure: Minimally Invasive Right  L1-2 Microdiscectomy;  Surgeon: [name], MD;  Location: [place];  Service: Orthopedics;  Laterality: N/A;  . TOTAL HIP ARTHROPLASTY Right   . TOTAL KNEE ARTHROPLASTY  05/29/2012   Procedure: TOTAL KNEE ARTHROPLASTY;  Surgeon: Mcarthur Rossetti, MD;  Location[facility];  Service: Orthopedics;  Laterality: Right;  Right Total Knee Arthroplasty  . TUBAL LIGATION    . UVULOPALATOPHARYNGOPLASTY    . VAGINAL HYSTERECTOMY     Past Medical History:  Diagnosis Date  . Active smoker   . Anxiety   . Calcifying tendinitis of shoulder   . Chronic back pain   . Chronic pain syndrome   . COPD (chronic obstructive pulmonary disease) ([place])   . Dysthymic disorder   . Emphysema lung ([place])   . GERD (gastroesophageal reflux disease)   . Headache    "weekly maybe" (01/31/2016)  . Heart murmur    for years, nothing to be concerned about  . Herpes genitalia   . History of blood transfusion 1980   related to "back surgery"  . Hyperlipidemia   . Hypertension   . Hypothyroidism   . Lumbago   . Osteoarthrosis, unspecified whether generalized or localized, lower leg   . Pain in joint, upper arm   . Pneumothorax, left 01/31/2016   S/P Left posterior  subcostal pain injection on 01/30/2016  . PONV (postoperative nausea and vomiting)    gets nauseous  with longer surgery. Difficuty voiding after surgery  . Postlaminectomy syndrome, thoracic region   . Primary localized osteoarthrosis, lower leg   . Restless leg syndrome   . Sleep apnea    s/p surgery- last sleep study 2011- doesnt use oxygen or machine at night as instructed,.   12/2014- Dr Halford Chessman  reports it is negative.  . Thyroid disease  BP (!) 148/85   Pulse 89   Ht 5\' 4"  (1.626 m) Comment: pt reported  Wt 155 lb (70.3 kg)   SpO2 94%   BMI 26.61 kg/m   Opioid Risk Score:   Fall Risk Score:  `1  Depression screen PHQ 2/9  Depression screen [facility] 2/9 12/01/2017 10/10/2017 09/10/2017 06/13/2017 05/15/2016 05/02/2016 12/07/2015  Decreased Interest 0 1 1 0 0 0 0  Down, Depressed, Hopeless 0 1 1 0 0 0 0  PHQ - 2 Score 0 2 2 0 0 0 0  Some recent data might be hidden    Review of Systems  Constitutional: Negative.   HENT: Negative.   Eyes: Negative.   Respiratory: Negative.   Cardiovascular: Negative.   Gastrointestinal: Positive for abdominal pain, constipation and rectal pain.  Endocrine: Negative.   Genitourinary: Positive for dysuria.  Musculoskeletal: Negative.   Skin: Negative.   Allergic/Immunologic: Negative.   Neurological: Negative.   Hematological: Negative.   Psychiatric/Behavioral: Negative.   All other systems reviewed and are negative.      Objective:   Physical Exam  Constitutional: She is oriented to person, place, and time. She appears well-developed and well-nourished.  HENT:  Head: Normocephalic and atraumatic.  Neck: Normal range of motion. Neck supple.  Cardiovascular: Normal rate and regular rhythm.  Pulmonary/Chest: Effort normal and breath sounds normal.  Musculoskeletal:  Normal Muscle Bulk and Muscle Testing Reveals: Upper Extremities: Full ROM and Muscle Strength 5/5 Lumbar Hypersensitivity Lower Extremities: Full ROM and Muscle Strength  5/5 Left Lower Extremity Flexion Produces Pain into Left Hip Arises from Table with ease Narrow Based Gait  Neurological: She is alert and oriented to person, place, and time.  Skin: Skin is warm and dry.  Psychiatric: She has a normal mood and affect.  Nursing note and vitals reviewed.         Assessment & Plan:  1. Lumbar Degenerative Disc/Chronic lumbar spine pain/post-lami syndrome: 12/01/2017 Refilled: We discuss different treat Regimens, Jill Phillips will call office with her decision. Today we will continue Fentanyl 50 MCG one patch every three days #10 and Oxycodone 10/325 mg one tablet every 6 hours as needed for pain #120. The above will be discuss with Dr Naaman Plummer regarding weaning medication slowly.  We will continue the opioid monitoring program, this consists of regular clinic visits, examinations, urine drug screen, pill counts as well as use of [place] Controlled Substance Reporting System. 2. Chronic Thoracic Pain: No complaints Today. Continue current medication regime and Continue to Monitor.12/01/17 3. Osteoarthritis of left knee:No complaints Today. . S/P Cortisone Injection on 05/12/2017 by Dr. [name]. Continue with Heat, exercise and voltaren. 12/01/2017 4. Rotator cuff syndrome/subacromial bursitis: No complaints voiced today. Continue to Monitor. 12/01/2017. 5. Restless legs syndrome: Continue with current treatment with Requip.Continue to monitor. 12/01/2017 6. Tobacco Abuse: Encourage and Educated on  Smoking Cessation: PCP prescribed Nicotine Patches : 12/01/2017 7. Muscle Spasm: Continue current treatment Regimen with Tizanidine. 12/01/2017 8. Insomnia: Continue current treatment regimen with  Seroquel. 12/01/2017 9. Neuropathic Pain: Continue current treatment regimen with Gabapentin.12/01/2017 10.Anxiety: Continue current treatment regimen with  Klonopin. 12/01/2017 11 Chronic Rectal Pain: Discuss Nucynta/ Dr. [name] Following. Jill Phillips will call with  her decision. 12/01/2017  20 minutes of face to face patient care time was spent during this visit. All questions were encouraged and answered.  F/u in 1 month

## 2017-12-02 ENCOUNTER — Encounter: Payer: Self-pay | Admitting: Internal Medicine

## 2017-12-02 ENCOUNTER — Ambulatory Visit (INDEPENDENT_AMBULATORY_CARE_PROVIDER_SITE_OTHER): Payer: Medicare Other | Admitting: Internal Medicine

## 2017-12-02 ENCOUNTER — Telehealth: Payer: Self-pay

## 2017-12-02 ENCOUNTER — Other Ambulatory Visit (INDEPENDENT_AMBULATORY_CARE_PROVIDER_SITE_OTHER): Payer: Medicare Other

## 2017-12-02 VITALS — BP 136/80 | HR 96 | Temp 98.2°F | Resp 16 | Wt 154.0 lb

## 2017-12-02 DIAGNOSIS — I10 Essential (primary) hypertension: Secondary | ICD-10-CM

## 2017-12-02 DIAGNOSIS — E7849 Other hyperlipidemia: Secondary | ICD-10-CM

## 2017-12-02 DIAGNOSIS — E119 Type 2 diabetes mellitus without complications: Secondary | ICD-10-CM | POA: Insufficient documentation

## 2017-12-02 DIAGNOSIS — E038 Other specified hypothyroidism: Secondary | ICD-10-CM | POA: Diagnosis not present

## 2017-12-02 DIAGNOSIS — N183 Chronic kidney disease, stage 3 unspecified: Secondary | ICD-10-CM

## 2017-12-02 DIAGNOSIS — K219 Gastro-esophageal reflux disease without esophagitis: Secondary | ICD-10-CM

## 2017-12-02 DIAGNOSIS — Z23 Encounter for immunization: Secondary | ICD-10-CM

## 2017-12-02 DIAGNOSIS — E1122 Type 2 diabetes mellitus with diabetic chronic kidney disease: Secondary | ICD-10-CM | POA: Insufficient documentation

## 2017-12-02 LAB — LIPID PANEL
Cholesterol: 217 mg/dL — ABNORMAL HIGH (ref 0–200)
HDL: 35 mg/dL — ABNORMAL LOW (ref >39.00)
NonHDL: 181.51
Total CHOL/HDL Ratio: 6
Triglycerides: 341 mg/dL — ABNORMAL HIGH (ref 0.0–149.0)
VLDL: 68.2 mg/dL — ABNORMAL HIGH (ref 0.0–40.0)

## 2017-12-02 LAB — LDL CHOLESTEROL, DIRECT: Direct LDL: 124 mg/dL

## 2017-12-02 LAB — COMPREHENSIVE METABOLIC PANEL
ALT: 15 U/L (ref 0–35)
AST: 19 U/L (ref 0–37)
Albumin: 4.3 g/dL (ref 3.5–5.2)
Alkaline Phosphatase: 127 U/L — ABNORMAL HIGH (ref 39–117)
BUN: 25 mg/dL — ABNORMAL HIGH (ref 6–23)
CO2: 32 mEq/L (ref 19–32)
Calcium: 9.9 mg/dL (ref 8.4–10.5)
Chloride: 101 mEq/L (ref 96–112)
Creatinine, Ser: 1.14 mg/dL (ref 0.40–1.20)
GFR: 51.2 mL/min — ABNORMAL LOW (ref >60.00)
Glucose, Bld: 91 mg/dL (ref 70–99)
Potassium: 4.1 mEq/L (ref 3.5–5.1)
Sodium: 141 mEq/L (ref 135–145)
Total Bilirubin: 0.5 mg/dL (ref 0.2–1.2)
Total Protein: 7.5 g/dL (ref 6.0–8.3)

## 2017-12-02 LAB — HEMOGLOBIN A1C: Hgb A1c MFr Bld: 6.3 % (ref 4.6–6.5)

## 2017-12-02 NOTE — Assessment & Plan Note (Signed)
Check lipid panel  Continue daily statin Regular exercise and healthy diet encouraged  

## 2017-12-02 NOTE — Assessment & Plan Note (Signed)
BP well controlled Current regimen effective and well tolerated Continue current medications at current doses cmp  

## 2017-12-02 NOTE — Assessment & Plan Note (Signed)
Diet controlled Will refer to diabetic education Recheck A1c today Stressed the importance of a low sugar/carbohydrate diet She is unable to exercise regularly due to chronic severe pain Encouraged keeping weight controlled

## 2017-12-02 NOTE — Assessment & Plan Note (Signed)
GERD controlled Continue daily medication  

## 2017-12-02 NOTE — Assessment & Plan Note (Signed)
Check tsh  Titrate med dose if needed  

## 2017-12-02 NOTE — Assessment & Plan Note (Signed)
Taking diclofenac daily-prescribed by pain management Increase fluids as much as possible CMP

## 2017-12-02 NOTE — Telephone Encounter (Signed)
Patient called today, requesting information about wether or not she would be a good candidate for nerve block injections and if so would she be able to be prescribed the pre-procedure medication as well.

## 2017-12-03 NOTE — Telephone Encounter (Signed)
Return Jill Phillips call, She reports when she seen her PCP Dr. Quay Burow on 12/02/2017, Dr. Quay Burow mentioned the nerve block. I reviewed Dr. Quay Burow note, she wrote pain management is following, no documentation noted about nerve block. Jill Phillips was seen in our office on 12/01/2017, she reported her rectal pain has increased in intensity, she describes her pain a burning stabbing pain. Spent time discussing with her the benefits of Tapentadol, she didn't want to change her medication, she was going to think about it and call office with her decision. Today, we will increase her gabapentin to 600 mg ( 2 capsules in the morning), one capsule in the afternoon and evening and two capsules in the bedtime. She was instructed to call if she experience any daytime drowsiness, she verbalizes understanding. Also instructed to call office on Monday 12/08/2017.

## 2017-12-03 NOTE — Telephone Encounter (Signed)
Jill Phillips, will you discuss with me when you have a chance? thanks

## 2017-12-04 ENCOUNTER — Encounter: Payer: Self-pay | Admitting: Internal Medicine

## 2017-12-09 DIAGNOSIS — R300 Dysuria: Secondary | ICD-10-CM | POA: Diagnosis not present

## 2017-12-09 DIAGNOSIS — R102 Pelvic and perineal pain: Secondary | ICD-10-CM | POA: Diagnosis not present

## 2017-12-09 LAB — HM DIABETES EYE EXAM

## 2017-12-10 ENCOUNTER — Ambulatory Visit (INDEPENDENT_AMBULATORY_CARE_PROVIDER_SITE_OTHER): Payer: Medicare Other | Admitting: Specialist

## 2017-12-12 ENCOUNTER — Encounter: Payer: Self-pay | Admitting: Internal Medicine

## 2017-12-22 ENCOUNTER — Other Ambulatory Visit: Payer: Self-pay | Admitting: Internal Medicine

## 2017-12-22 ENCOUNTER — Telehealth: Payer: Self-pay | Admitting: Internal Medicine

## 2017-12-22 NOTE — Telephone Encounter (Signed)
Copied from [place] [phone]. Topic: Quick Communication - Rx Refill/Question >> Dec 22, 2017 11:40 AM Lennox Solders wrote: Medication: dm glucometer and lancets, and testing strips Has the patient contacted their pharmacy no. Pt said she was dx with dm and needs the dm supplies (Preferred Pharmacy (with phone number or street name): [facility] in [place] . Pt [facility]t [facility] on 12-30-17

## 2017-12-22 NOTE — Telephone Encounter (Signed)
I do not see in office notes or results notes where pt is to start checking blood sugars. Please advise.

## 2017-12-23 NOTE — Telephone Encounter (Signed)
She can check if she wants - once a day  Or as needed.  She does not have to check if she does not want to.    I will not be able to sign rx - ? Can it be called in?

## 2017-12-23 NOTE — Telephone Encounter (Signed)
LVM informing pt to call back if she would still like glucometer sent to pharmacy.

## 2017-12-30 ENCOUNTER — Ambulatory Visit: Payer: Medicare Other

## 2017-12-31 ENCOUNTER — Encounter: Payer: Medicare Other | Admitting: Physical Medicine & Rehabilitation

## 2018-01-01 ENCOUNTER — Encounter: Payer: Self-pay | Admitting: Registered Nurse

## 2018-01-01 ENCOUNTER — Encounter: Payer: Medicare Other | Attending: Physical Medicine & Rehabilitation | Admitting: Registered Nurse

## 2018-01-01 VITALS — BP 112/69 | HR 97 | Resp 14 | Ht 63.0 in | Wt 153.0 lb

## 2018-01-01 DIAGNOSIS — Z9981 Dependence on supplemental oxygen: Secondary | ICD-10-CM | POA: Insufficient documentation

## 2018-01-01 DIAGNOSIS — G9619 Other disorders of meninges, not elsewhere classified: Secondary | ICD-10-CM | POA: Insufficient documentation

## 2018-01-01 DIAGNOSIS — F411 Generalized anxiety disorder: Secondary | ICD-10-CM | POA: Diagnosis not present

## 2018-01-01 DIAGNOSIS — G2581 Restless legs syndrome: Secondary | ICD-10-CM | POA: Diagnosis not present

## 2018-01-01 DIAGNOSIS — E785 Hyperlipidemia, unspecified: Secondary | ICD-10-CM | POA: Insufficient documentation

## 2018-01-01 DIAGNOSIS — Z79899 Other long term (current) drug therapy: Secondary | ICD-10-CM | POA: Diagnosis not present

## 2018-01-01 DIAGNOSIS — Z9889 Other specified postprocedural states: Secondary | ICD-10-CM | POA: Diagnosis not present

## 2018-01-01 DIAGNOSIS — E079 Disorder of thyroid, unspecified: Secondary | ICD-10-CM | POA: Diagnosis not present

## 2018-01-01 DIAGNOSIS — M5136 Other intervertebral disc degeneration, lumbar region: Secondary | ICD-10-CM | POA: Diagnosis not present

## 2018-01-01 DIAGNOSIS — Z720 Tobacco use: Secondary | ICD-10-CM | POA: Diagnosis not present

## 2018-01-01 DIAGNOSIS — M4316 Spondylolisthesis, lumbar region: Secondary | ICD-10-CM

## 2018-01-01 DIAGNOSIS — K6289 Other specified diseases of anus and rectum: Secondary | ICD-10-CM

## 2018-01-01 DIAGNOSIS — M5126 Other intervertebral disc displacement, lumbar region: Secondary | ICD-10-CM | POA: Insufficient documentation

## 2018-01-01 DIAGNOSIS — E039 Hypothyroidism, unspecified: Secondary | ICD-10-CM | POA: Insufficient documentation

## 2018-01-01 DIAGNOSIS — G4709 Other insomnia: Secondary | ICD-10-CM | POA: Diagnosis not present

## 2018-01-01 DIAGNOSIS — J449 Chronic obstructive pulmonary disease, unspecified: Secondary | ICD-10-CM | POA: Diagnosis not present

## 2018-01-01 DIAGNOSIS — M170 Bilateral primary osteoarthritis of knee: Secondary | ICD-10-CM | POA: Diagnosis not present

## 2018-01-01 DIAGNOSIS — M2170 Unequal limb length (acquired), unspecified site: Secondary | ICD-10-CM | POA: Insufficient documentation

## 2018-01-01 DIAGNOSIS — G894 Chronic pain syndrome: Secondary | ICD-10-CM | POA: Insufficient documentation

## 2018-01-01 DIAGNOSIS — Z79891 Long term (current) use of opiate analgesic: Secondary | ICD-10-CM

## 2018-01-01 DIAGNOSIS — Z5181 Encounter for therapeutic drug level monitoring: Secondary | ICD-10-CM

## 2018-01-01 DIAGNOSIS — I10 Essential (primary) hypertension: Secondary | ICD-10-CM | POA: Insufficient documentation

## 2018-01-01 DIAGNOSIS — M62830 Muscle spasm of back: Secondary | ICD-10-CM

## 2018-01-01 DIAGNOSIS — R300 Dysuria: Secondary | ICD-10-CM | POA: Diagnosis not present

## 2018-01-01 DIAGNOSIS — M961 Postlaminectomy syndrome, not elsewhere classified: Secondary | ICD-10-CM

## 2018-01-01 DIAGNOSIS — K219 Gastro-esophageal reflux disease without esophagitis: Secondary | ICD-10-CM | POA: Insufficient documentation

## 2018-01-01 DIAGNOSIS — R102 Pelvic and perineal pain: Secondary | ICD-10-CM

## 2018-01-01 DIAGNOSIS — G8929 Other chronic pain: Secondary | ICD-10-CM

## 2018-01-01 MED ORDER — FENTANYL 50 MCG/HR TD PT72: 50.0000 ug | MEDICATED_PATCH | TRANSDERMAL | 0 refills | Status: DC

## 2018-01-01 MED ORDER — ROPINIROLE HCL 0.5 MG PO TABS: 1.0000 mg | ORAL_TABLET | Freq: Every day | ORAL | 2 refills | Status: DC

## 2018-01-01 MED ORDER — CLONAZEPAM 2 MG PO TABS: 2.0000 mg | ORAL_TABLET | Freq: Two times a day (BID) | ORAL | 2 refills | Status: DC | PRN

## 2018-01-01 MED ORDER — OXYCODONE-ACETAMINOPHEN 10-325 MG PO TABS: 1.0000 | ORAL_TABLET | Freq: Four times a day (QID) | ORAL | 0 refills | Status: DC | PRN

## 2018-01-01 MED ORDER — TIZANIDINE HCL 4 MG PO TABS: ORAL_TABLET | ORAL | 3 refills | Status: DC

## 2018-01-01 NOTE — Patient Instructions (Signed)
Tapentadol extended-release tablets What is this medicine? TAPENTADOL (ta PEN ta dol) is a pain reliever. It is used to treat moderate to severe pain that lasts for more than a few days. It is also used to treat nerve pain caused by diabetes. This medicine may be used for other purposes; ask your health care provider or pharmacist if you have questions. COMMON BRAND NAME(S): Nucynta ER What should I tell my health care provider before I take this medicine? They need to know if you have any of these conditions: -Addison's disease -gallbladder disease -head injury -history of a drug or alcohol abuse problem -if you often drink alcohol -kidney disease -liver disease -low blood pressure -lung or breathing disease, like asthma -mental illness -prostate disease -seizures -stomach or intestine problems -thyroid disease -an unusual or allergic reaction to tapentadol, other medicines, foods, dyes, or preservatives -pregnant or trying to get pregnant -breast-feeding How should I use this medicine? Take this medicine by mouth with a glass of water. Do not cut, crush, or chew this medicine. Do not take a tablet that is not whole. A broken or crushed tablet can be very dangerous. You may get too much medicine. Swallow only one tablet at a time. Do not wet, soak, or lick the tablet before you take it. You can take it with or without food. If it upsets your stomach, take it with food. Follow the directions on the prescription label. Take your medicine at regular intervals. Do not take it more often than directed. Do not stop taking except on your doctor's advice. A special MedGuide will be given to you by the pharmacist with each prescription and refill. Be sure to read this information carefully each time. Talk to your pediatrician regarding the use of this medicine in children. Special care may be needed. Overdosage: If you think you have taken too much of this medicine contact a poison control center  or emergency room at once. NOTE: This medicine is only for you. Do not share this medicine with others. What if I miss a dose? If you miss a dose, take it as soon as you can. If it is almost time for your next dose, take only that dose. Do not take double or extra doses. What may interact with this medicine? Do not take this medicine with any of the following medications: -MAOIs like Carbex, Eldepryl, Marplan, Nardil, and Parnate This medicine may also interact with the following medications: -alcohol or any product that contains alcohol -antihistamines for allergy, cough and cold -atropine -certain medicines for anxiety or sleep -certain medicines for bladder problems like oxybutynin, tolterodine -certain medicines for depression like amitriptyline, fluoxetine, sertraline -certain medicines for migraine headache like almotriptan, eletriptan, frovatriptan, naratriptan, rizatriptan, sumatriptan, zolmitriptan -certain medicines for Parkinson's disease like benztropine, trihexyphenidyl -certain medicines for seizures like phenobarbital, primidone -certain medicines for stomach problems like dicyclomine, hyoscyamine -certain medicines for travel sickness like scopolamine -general anesthetics like halothane, isoflurane, methoxyflurane, propofol -ipratropium -local anesthetics like lidocaine, pramoxine, tetracaine -medicines that relax muscles for surgery -other narcotic medicines for pain or cough -phenothiazines like chlorpromazine, mesoridazine, prochlorperazine, thioridazine This list may not describe all possible interactions. Give your health care provider a list of all the medicines, herbs, non-prescription drugs, or dietary supplements you use. Also tell them if you smoke, drink alcohol, or use illegal drugs. Some items may interact with your medicine. What should I watch for while using this medicine? Tell your doctor or health care professional if your pain does not go  away, if it gets  worse, or if you have new or a different type of pain. You may develop tolerance to the medicine. Tolerance means that you will need a higher dose of the medicine for pain relief. Tolerance is normal and is expected if you take the medicine for a long time. Do not suddenly stop taking your medicine because you may develop a severe reaction. Your body becomes used to the medicine. This does NOT mean you are addicted. Addiction is a behavior related to getting and using a drug for a non-medical reason. If you have pain, you have a medical reason to take pain medicine. Your doctor will tell you how much medicine to take. If your doctor wants you to stop the medicine, the dose will be slowly lowered over time to avoid any side effects. There are different types of narcotic medicines (opiates). If you take more than one type at the same time or if you are taking another medicine that also causes drowsiness, you may have more side effects. Give your health care provider a list of all medicines you use. Your doctor will tell you how much medicine to take. Do not take more medicine than directed. Call emergency for help if you have problems breathing or unusual sleepiness. You may get drowsy or dizzy. Do not drive, use machinery, or do anything that needs mental alertness until you know how this medicine affects you. Do not stand or sit up quickly, especially if you are an older patient. This reduces the risk of dizzy or fainting spells. Alcohol may interfere with the effect of this medicine. Avoid alcoholic drinks. This medicine will cause constipation. Try to have a bowel movement at least every 2 to 3 days. If you do not have a bowel movement for 3 days, call your doctor or health care professional. Your mouth may get dry. Chewing sugarless gum or sucking hand candy, and drinking plenty of water may help. Contact your doctor if the problem does not go away or is severe. What side effects may I notice from receiving  this medicine? Side effects that you should report to your doctor or health care professional as soon as possible: -allergic reactions like skin rash, itching or hives, swelling of the face, lips, or tongue -breathing problems -confusion -seizures -signs and symptoms of low blood pressure like dizziness; feeling faint or lightheaded, falls; unusually weak or tired -trouble passing urine or change in the amount of urine Side effects that usually do not require medical attention (report to your doctor or health care professional if they continue or are bothersome): -constipation -dry mouth -nausea, vomiting -tiredness This list may not describe all possible side effects. Call your doctor for medical advice about side effects. You may report side effects to FDA at 1-800-FDA-1088. Where should I keep my medicine? Keep out of the reach of children. This medicine can be abused. Keep your medicine in a safe place to protect it from theft. Do not share this medicine with anyone. Selling or giving away this medicine is dangerous and against the law. Store at room temperature between 15 and 30 degrees C (59 and 86 degrees F). Protect from moisture. This medicine may cause accidental overdose and death if it is taken by other adults, children, or pets. Flush any unused medicine down the toilet to reduce the chance of harm. Do not use the medicine after the expiration date. NOTE: This sheet is a summary. It may not cover all possible information. If  you have questions about this medicine, talk to your doctor, pharmacist, or health care provider.  2018 Elsevier/Gold Standard (2015-09-21 11:34:44)

## 2018-01-01 NOTE — Progress Notes (Signed)
Subjective:    Patient ID: Jill Phillips, female    DOB: 08/16/1956, 62 y.o.   MRN: [id number]  HPI: [name] is a 62 year old female who returns for follow up appointment for chronic pain and medication refill. She states her pain is located in her lower back, vaginal and rectal pain. Also reports her lower back pain has increased in intensity and rectal pain. Also states at times she has dysuria, we will obtain a urine culture today, she verbalizes understanding. Also complaining of left wrist pain with tingling , she will follow up with Dr. Naaman Plummer next month, has the possibility of Carpal Tunnel he will decide if she needs an EMG, she verbalizes understanding.  Spent a great deal of time talking to Jill Phillips regarding her neuropathic pain and her current analgesics, we discussed decreasing her current analgesic or changing to a different regimen to Tapentadol due to her increase frequency and intensity of neuropathic pain. Today, her medication will remain the same. She was instructed to keep a Pain journal and bring it with her next month, she was instructed to try to decrease her oxycodone to TID, she verbalizes understanding.  She rates her pain 8. Her current exercise regime is walking and performing stretching exercises.  Jill Phillips Morphine Equivalent is 198.00 MME. We have discussed the black box warning of using opioids and benzodiazepines. I highlighted the dangers of using these drugs together and discussed the adverse events including respiratory suppression, overdose, cognitive impairment and importance of compliance with current regimen. We will continue to monitor and adjust as indicated.   Last UDS on 11/16/2017, it was inconsistent, see notes for details.   Pain Inventory Average Pain 8 Pain Right Now 8 My pain is dull and aching  In the last 24 hours, has pain interfered with the following? General activity 4 Relation with others 7 Enjoyment of life 5 What TIME  of day is your pain at its worst? all Sleep (in general) Good  Pain is worse with: walking, sitting, standing and some activites Pain improves with: rest and medication Relief from Meds: 8  Mobility walk without assistance ability to climb steps?  no do you drive?  yes Do you have any goals in this area?  yes  Function Do you have any goals in this area?  no  Neuro/Psych weakness spasms  Prior Studies Any changes since last visit?  no  Physicians involved in your care Any changes since last visit?  no   Family History  Problem Relation Age of Onset  . Kidney disease Mother   . Heart disease Father   . Anuerysm Brother 29       brain  . Heart disease Brother   . Heart disease Sister 69       s/p CABG  . Hypertension Sister   . Colon cancer Neg Hx    Social History   Socioeconomic History  . Marital status: Divorced    Spouse name: n/a  . Number of children: 2  . Years of education: 12+  . Highest education level: Not on file  Occupational History  . Occupation: disability    Comment: back surgeries  Social Needs  . Financial resource strain: Not on file  . Food insecurity:    Worry: Not on file    Inability: Not on file  . Transportation needs:    Medical: Not on file    Non-medical: Not on file  Tobacco Use  .  Smoking status: Current Every Day Smoker    Packs/day: 1.25    Years: 45.00    Pack years: 56.25    Types: Cigarettes  . Smokeless tobacco: Never Used  . Tobacco comment: form given 12/25/16  Substance and Sexual Activity  . Alcohol use: No    Alcohol/week: 0.0 oz  . Drug use: No    Types: Marijuana    Comment: 01/31/2016 "none since ~ 1980"  . Sexual activity: Never    Partners: Male  Lifestyle  . Physical activity:    Days per week: Not on file    Minutes per session: Not on file  . Stress: Not on file  Relationships  . Social connections:    Talks on phone: Not on file    Gets together: Not on file    Attends religious service:  Not on file    Active member of club or organization: Not on file    Attends meetings of clubs or organizations: Not on file    Relationship status: Not on file  Other Topics Concern  . Not on file  Social History Narrative   Lives alone.  One daughter is local, but is getting ready to move to [place], where her children live with their father.  The other daughter lives near [place], [place].   Past Surgical History:  Procedure Laterality Date  . APPENDECTOMY    . BACK SURGERY     18 back surgeries (2 thoracic & 16 lumbar) (01/31/2016)  . DILATION AND CURETTAGE OF UTERUS    . HAMMER TOE SURGERY    . IR RADIOLOGIST EVAL & MGMT  07/21/2017  . JOINT REPLACEMENT    . KNEE ARTHROSCOPY Right   . LAPAROSCOPIC CHOLECYSTECTOMY    . LUMBAR FUSION N/A 08/01/2015   Procedure: Right sided L1-2 and L2-3 transforaminal lumbar interbody fusion with cages, Extension of posterior fusion T12 to L3, Replaced pedicle screws bilaterally L1-L2 , Replaced left sided pedicle screws L-3. Instrumentation T12 to L3 using local bone graft, Vivigen allograft and cancellous chips;  Surgeon: [name], MD;  Location: [place] [facility];  Service: Orthopedics;  Laterality: N/A;  . LUMBAR LAMINECTOMY/DECOMPRESSION MICRODISCECTOMY N/A 01/28/2014   Procedure: Minimally Invasive Right  L1-2 Microdiscectomy;  Surgeon: [name], MD;  Location: [facility];  Service: Orthopedics;  Laterality: N/A;  . TOTAL HIP ARTHROPLASTY Right   . TOTAL KNEE ARTHROPLASTY  05/29/2012   Procedure: TOTAL KNEE ARTHROPLASTY;  Surgeon: Mcarthur Rossetti, MD;  Location: [facility];  Service: Orthopedics;  Laterality: Right;  Right Total Knee Arthroplasty  . TUBAL LIGATION    . UVULOPALATOPHARYNGOPLASTY    . VAGINAL HYSTERECTOMY     Past Medical History:  Diagnosis Date  . Active smoker   . Anxiety   . Calcifying tendinitis of shoulder   . Chronic back pain   . Chronic pain syndrome   . COPD (chronic obstructive pulmonary disease) ([place])   . Dysthymic  disorder   . Emphysema lung ([place])   . GERD (gastroesophageal reflux disease)   . Headache    "weekly maybe" (01/31/2016)  . Heart murmur    for years, nothing to be concerned about  . Herpes genitalia   . History of blood transfusion 1980   related to "back surgery"  . Hyperlipidemia   . Hypertension   . Hypothyroidism   . Lumbago   . Osteoarthrosis, unspecified whether generalized or localized, lower leg   . Pain in joint, upper arm   . Pneumothorax,  left 01/31/2016   S/P Left posterior subcostal pain injection on 01/30/2016  . PONV (postoperative nausea and vomiting)    gets nauseous  with longer surgery. Difficuty voiding after surgery  . Postlaminectomy syndrome, thoracic region   . Primary localized osteoarthrosis, lower leg   . Restless leg syndrome   . Sleep apnea    s/p surgery- last sleep study 2011- doesnt use oxygen or machine at night as instructed,.   12/2014- Dr Halford Chessman  reports it is negative.  . Thyroid disease    BP 112/69 (BP Location: Left Arm, Patient Position: Sitting, Cuff Size: Normal)   Pulse 97   Resp 14   Ht 5\' 3"  (1.6 m)   Wt 153 lb (69.4 kg)   SpO2 93%   BMI 27.10 kg/m   Opioid Risk Score:   Fall Risk Score:  `1  Depression screen PHQ 2/9  Depression screen [facility] 2/9 01/01/2018 12/01/2017 10/10/2017 09/10/2017 06/13/2017 05/15/2016 05/02/2016  Decreased Interest 0 0 1 1 0 0 0  Down, Depressed, Hopeless 0 0 1 1 0 0 0  PHQ - 2 Score 0 0 2 2 0 0 0  Some recent data might be hidden    Review of Systems  Constitutional: Negative.   HENT: Negative.   Eyes: Negative.   Respiratory: Negative.   Cardiovascular: Negative.   Gastrointestinal: Positive for abdominal pain and constipation.  Endocrine: Negative.   Genitourinary: Positive for dysuria.  Musculoskeletal: Positive for arthralgias and back pain.       Spasms   Skin: Negative.   Allergic/Immunologic: Negative.   Neurological: Positive for weakness.  Psychiatric/Behavioral: Negative.          Objective:   Physical Exam  Constitutional: She is oriented to person, place, and time. She appears well-developed and well-nourished.  HENT:  Head: Normocephalic and atraumatic.  Neck: Normal range of motion. Neck supple.  Cardiovascular: Normal rate and regular rhythm.  Pulmonary/Chest: Effort normal and breath sounds normal.  Musculoskeletal:  Normal Muscle Bulk and Muscle Testing Reveals: Upper Extremities: Full ROM and Muscle Strength 5/5 Lumbar Paraspinal Tenderness: L-4-L-5 Lower Extremities: Full ROM and Muscle Strength 5/5 Arises from chair with ease Narrow Based Gait  Neurological: She is alert and oriented to person, place, and time.  Skin: Skin is warm and dry.  Nursing note and vitals reviewed.         Assessment & Plan:  1. 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 Stempel will call office with her decision in two weeks. Today we will continue Fentanyl 50 MCG one patch every three days #10 and Oxycodone 10/325 mg one tablet every 6 hours as needed for pain #120. Instructed to Keep Pain Journal and try to decrease her Oxycodone to three times a day as needed for pain. We will continue the opioid monitoring program, this consists of regular clinic visits, examinations, urine drug screen, pill counts as well as use of [place] Controlled Substance Reporting System. 2. Chronic Thoracic Pain: No complaints Today. Continue current medication regime and Continue to Monitor.01/01/18 3. Osteoarthritis of left knee:No complaints Today. . S/P Cortisone Injection on 05/12/2017 by Dr. [name]. Continue with Heat, exercise and voltaren.01/01/2018 4. Rotator cuff syndrome/subacromial bursitis: No complaints voiced today. Continue to Monitor. 01/01/2018. 5. Restless legs syndrome: Continue with current treatment with Requip.Continue to monitor. 01/01/2018 6. Tobacco Abuse: Encourage and  Educated on  Smoking Cessation: PCP prescribed Nicotine Patches : 01/01/2018 7. Muscle Spasm: Continue  current treatment Regimen with Tizanidine. 01/01/2018 8. Insomnia: Continue current treatment regimen with  Seroquel. 01/01/2018 9. Neuropathic Pain: Continue current treatment regimen with Gabapentin.01/01/2018 10.Anxiety: Continue current treatment regimen with  Klonopin. 01/01/2018 11 Chronic Rectal Pain: Discuss Nucynta/ Dr. [name] Following. Ms. Pell will call with her decision. 01/01/2018 12. Dysuria: RX: Urine Culture 13. Vaginal Pain: Gynecologist Following.   30 minutes of face to face patient care time was spent during this visit. All questions were encouraged and answered.  F/u in 1 month

## 2018-01-02 ENCOUNTER — Telehealth: Payer: Self-pay | Admitting: Registered Nurse

## 2018-01-02 ENCOUNTER — Ambulatory Visit: Payer: Medicare Other

## 2018-01-02 LAB — MICROSCOPIC EXAMINATION

## 2018-01-02 LAB — URINALYSIS, ROUTINE W REFLEX MICROSCOPIC
Bilirubin, UA: NEGATIVE
Glucose, UA: NEGATIVE
Ketones, UA: NEGATIVE
Nitrite, UA: POSITIVE — AB
Protein, UA: NEGATIVE
RBC, UA: NEGATIVE
Specific Gravity, UA: 1.021 (ref 1.005–1.030)
Urobilinogen, Ur: 1 mg/dL (ref 0.2–1.0)
pH, UA: 5.5 (ref 5.0–7.5)

## 2018-01-02 NOTE — Telephone Encounter (Signed)
Placed a call to [name], reviewed Lab results, she verbalizes understanding. Also instructed to to follow up with her PCP, she verbalizes understanding. Also instructed if she develops any symptoms over the weekend to go to Urgent Care or ED she verbalizes understanding.

## 2018-01-06 ENCOUNTER — Ambulatory Visit: Payer: Medicare Other

## 2018-01-13 ENCOUNTER — Ambulatory Visit: Payer: Medicare Other

## 2018-01-15 ENCOUNTER — Telehealth: Payer: Self-pay | Admitting: Emergency Medicine

## 2018-01-15 DIAGNOSIS — N329 Bladder disorder, unspecified: Secondary | ICD-10-CM

## 2018-01-15 NOTE — Telephone Encounter (Signed)
Copied from [place] [id number]. Topic: Referral - Request >> Jan 15, 2018 10:16 AM Carolyn Stare wrote:  Pt call to ask for a referral to see Dr Risa Grill a Urologist for bladder issues   [phone]

## 2018-01-28 ENCOUNTER — Encounter (INDEPENDENT_AMBULATORY_CARE_PROVIDER_SITE_OTHER): Payer: Self-pay | Admitting: Specialist

## 2018-01-28 ENCOUNTER — Ambulatory Visit (INDEPENDENT_AMBULATORY_CARE_PROVIDER_SITE_OTHER): Payer: Medicare Other | Admitting: Specialist

## 2018-01-28 ENCOUNTER — Other Ambulatory Visit: Payer: Self-pay | Admitting: Registered Nurse

## 2018-01-28 VITALS — BP 103/70 | HR 90 | Ht 63.0 in | Wt 153.0 lb

## 2018-01-28 DIAGNOSIS — G5602 Carpal tunnel syndrome, left upper limb: Secondary | ICD-10-CM

## 2018-01-28 DIAGNOSIS — M533 Sacrococcygeal disorders, not elsewhere classified: Secondary | ICD-10-CM | POA: Diagnosis not present

## 2018-01-28 NOTE — Patient Instructions (Signed)
Avoid frequent bending and stooping  No lifting greater than 10 lbs. May use ice or moist heat for pain. Weight loss is of benefit. Handicap license is approved. Carpal Tunnel Syndrome  Carpal tunnel syndrome is a condition that causes pain in your hand and arm. The carpal tunnel is a narrow area located on the palm side of your wrist. Repeated wrist motion or certain diseases may cause swelling within the tunnel. This swelling pinches the main nerve in the wrist (median nerve). What are the causes? This condition may be caused by:  Repeated wrist motions.  Wrist injuries.  Arthritis.  A cyst or tumor in the carpal tunnel.  Fluid buildup during pregnancy. Sometimes the cause of this condition is not known. What increases the risk? This condition is more likely to develop in:  People who have jobs that cause them to repeatedly move their wrists in the same motion, such as Art gallery manager.  Women.  People with certain conditions, such as: ? Diabetes. ? Obesity. ? An underactive thyroid (hypothyroidism). ? Kidney failure. What are the signs or symptoms? Symptoms of this condition include:  A tingling feeling in your fingers, especially in your thumb, index, and middle fingers.  Tingling or numbness in your hand.  An aching feeling in your entire arm, especially when your wrist and elbow are bent for long periods of time.  Wrist pain that goes up your arm to your shoulder.  Pain that goes down into your palm or fingers.  A weak feeling in your hands. You may have trouble grabbing and holding items. Your symptoms may feel worse during the night. How is this diagnosed? This condition is diagnosed with a medical history and physical exam. You may also have tests, including:  An electromyogram (EMG). This test measures electrical signals sent by your nerves into the muscles.  X-rays. How is this treated? Treatment for this condition includes:  Lifestyle changes.  It is important to stop doing or modify the activity that caused your condition.  Physical or occupational therapy.  Medicines for pain and inflammation. This may include medicine that is injected into your wrist.  A wrist splint.  Surgery. Follow these instructions at home: If you have a splint:   Wear it as told by your health care provider. Remove it only as told by your health care provider.  Loosen the splint if your fingers become numb and tingle, or if they turn cold and blue.  Keep the splint clean and dry. General instructions   Take over-the-counter and prescription medicines only as told by your health care provider.  Rest your wrist from any activity that may be causing your pain. If your condition is work related, talk to your employer about changes that can be made, such as getting a wrist pad to use while typing.  If directed, apply ice to the painful area: ? Put ice in a plastic bag. ? Place a towel between your skin and the bag. ? Leave the ice on for 20 minutes, 2-3 times per day.  Keep all follow-up visits as told by your health care provider. This is important.  Do any exercises as told by your health care provider, physical therapist, or occupational therapist. Contact a health care provider if:  You have new symptoms.  Your pain is not controlled with medicines.  Your symptoms get worse. This information is not intended to replace advice given to you by your health care provider. Make sure you discuss any questions  you have with your health care provider. Document Released: 08/16/2000 Document Revised: 12/28/2015 Document Reviewed: 04/30/2017 Elsevier Interactive Patient Education  2017 [employer].

## 2018-01-28 NOTE — Progress Notes (Signed)
Office Visit Note   Patient: Jill Phillips           Date of Birth: [birth year]           MRN: [id number] Visit Date: 01/28/2018              Requested by: [name], MD [place], [place] [id number] PCP: [name], MD   Assessment & Plan: Visit Diagnoses:  1. Carpal tunnel syndrome, left upper limb   2. Sacroiliac joint pain     Plan:  Avoid frequent bending and stooping  No lifting greater than 10 lbs. May use ice or moist heat for pain. Weight loss is of benefit. Handicap license is approved. Carpal Tunnel Syndrome  Carpal tunnel syndrome is a condition that causes pain in your hand and arm. The carpal tunnel is a narrow area located on the palm side of your wrist. Repeated wrist motion or certain diseases may cause swelling within the tunnel. This swelling pinches the main nerve in the wrist (median nerve). What are the causes? This condition may be caused by:  Repeated wrist motions.  Wrist injuries.  Arthritis.  A cyst or tumor in the carpal tunnel.  Fluid buildup during pregnancy. Sometimes the cause of this condition is not known. What increases the risk? This condition is more likely to develop in:  People who have jobs that cause them to repeatedly move their wrists in the same motion, such as Art gallery manager.  Women.  People with certain conditions, such as: ? Diabetes. ? Obesity. ? An underactive thyroid (hypothyroidism). ? Kidney failure. What are the signs or symptoms? Symptoms of this condition include:  A tingling feeling in your fingers, especially in your thumb, index, and middle fingers.  Tingling or numbness in your hand.  An aching feeling in your entire arm, especially when your wrist and elbow are bent for long periods of time.  Wrist pain that goes up your arm to your shoulder.  Pain that goes down into your palm or fingers.  A weak feeling in your hands. You may have trouble grabbing and holding  items. Your symptoms may feel worse during the night. How is this diagnosed? This condition is diagnosed with a medical history and physical exam. You may also have tests, including:  An electromyogram (EMG). This test measures electrical signals sent by your nerves into the muscles.  X-rays. How is this treated? Treatment for this condition includes:  Lifestyle changes. It is important to stop doing or modify the activity that caused your condition.  Physical or occupational therapy.  Medicines for pain and inflammation. This may include medicine that is injected into your wrist.  A wrist splint.  Surgery. Follow these instructions at home: If you have a splint:   Wear it as told by your health care provider. Remove it only as told by your health care provider.  Loosen the splint if your fingers become numb and tingle, or if they turn cold and blue.  Keep the splint clean and dry. General instructions   Take over-the-counter and prescription medicines only as told by your health care provider.  Rest your wrist from any activity that may be causing your pain. If your condition is work related, talk to your employer about changes that can be made, such as getting a wrist pad to use while typing.  If directed, apply ice to the painful area: ? Put ice in a plastic bag. ? Place  a towel between your skin and the bag. ? Leave the ice on for 20 minutes, 2-3 times per day.  Keep all follow-up visits as told by your health care provider. This is important.  Do any exercises as told by your health care provider, physical therapist, or occupational therapist. Contact a health care provider if:  You have new symptoms.  Your pain is not controlled with medicines.  Your symptoms get worse. This information is not intended to replace advice given to you by your health care provider. Make sure you discuss any questions you have with your health care provider. Document Released:  08/16/2000 Document Revised: 12/28/2015 Document Reviewed: 04/30/2017 Elsevier Interactive Patient Education  2017 [place] Instructions: No follow-ups on file.   Orders:  Orders Placed This Encounter  Procedures  . Ambulatory referral to Neurology   No orders of the defined types were placed in this encounter.     Procedures: No procedures performed   Clinical Data: No additional findings.   Subjective: Chief Complaint  Patient presents with  . Right Hip - Pain, Follow-up    62 year old female with history of T11 to S1 fusion for primary degenerative disc disease and proximal junctional changes. She has had a decrease in rectal pain but is having more bladder and suprapubic pain. History of IS by Dr. Risa Grill in the past. Her left hip is painfut to stand and walk. She reports that she can't stand or walk any distance. I don't go out except to grocery, and she has to use a push cart and have to have something to lean onto.  She is constipated,  Laxative to be taken today at home. Complains of left hand and finger numbness and pain in the anterior pelvis and pain into the hips left and right.    Review of Systems  HENT: Negative.   Respiratory: Negative.   Cardiovascular: Negative.   Gastrointestinal: Negative.   Genitourinary: Positive for dysuria, pelvic pain and vaginal pain.  Musculoskeletal: Positive for back pain, gait problem, neck pain and neck stiffness.  Neurological: Positive for weakness and numbness.     Objective: Vital Signs: BP 103/70   Pulse 90   Ht 5\' 3"  (1.6 m)   Wt 153 lb (69.4 kg)   BMI 27.10 kg/m   Physical Exam  Ortho Exam  Specialty Comments:  No specialty comments available.  Imaging: No results found.   PMFS History: Patient Active Problem List   Diagnosis Date Noted  . Degenerative disc disease, lumbar 08/01/2015    Priority: High    Class: Chronic  . Spondylolisthesis of lumbar region 08/01/2015    Priority:  High    Class: Chronic  . Type 2 diabetes mellitus without complication, without long-term current use of insulin ([place]) 12/02/2017  . Sacral pain 09/17/2017  . COPD (chronic obstructive pulmonary disease) ([place]) 04/21/2017  . Lower back pain 03/03/2017  . CKD (chronic kidney disease) stage 3, GFR 30-59 ml/min (HCC) 08/07/2016  . GERD (gastroesophageal reflux disease) 08/07/2016  . Chronic respiratory failure ([place]) 07/29/2016  . Hypersomnia 07/29/2016  . Arthritis of carpometacarpal [facility]) joint of left thumb 07/09/2016  . Pneumothorax on left 01/31/2016  . Chronic, continuous use of opioids 09/06/2015  . Urinary, incontinence, stress female 05/06/2014  . Constipation 05/05/2014  . 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 HTN (hypertension) 05/19/2013  . HLD (hyperlipidemia) 05/19/2013  . Depression 05/19/2013  . Hypothyroidism 05/19/2013  . Tobacco abuse   . Osteoarthritis of both knees  11/18/2011  . RLS (restless legs syndrome) 11/18/2011   Past Medical History:  Diagnosis Date  . Active smoker   . Anxiety   . Calcifying tendinitis of shoulder   . Chronic back pain   . Chronic pain syndrome   . COPD (chronic obstructive pulmonary disease) ([place])   . Dysthymic disorder   . Emphysema lung ([place])   . GERD (gastroesophageal reflux disease)   . Headache    "weekly maybe" (01/31/2016)  . Heart murmur    for years, nothing to be concerned about  . Herpes genitalia   . History of blood transfusion 1980   related to "back surgery"  . Hyperlipidemia   . Hypertension   . Hypothyroidism   . Lumbago   . Osteoarthrosis, unspecified whether generalized or localized, lower leg   . Pain in joint, upper arm   . Pneumothorax, left 01/31/2016   S/P Left posterior subcostal pain injection on 01/30/2016  . PONV (postoperative nausea and vomiting)    gets nauseous  with longer surgery. Difficuty voiding after surgery  . Postlaminectomy syndrome, thoracic region   . Primary localized osteoarthrosis, lower  leg   . Restless leg syndrome   . Sleep apnea    s/p surgery- last sleep study 2011- doesnt use oxygen or machine at night as instructed,.   12/2014- Dr Halford Chessman  reports it is negative.  . Thyroid disease     Family History  Problem Relation Age of Onset  . Kidney disease Mother   . Heart disease Father   . Anuerysm Brother 29       brain  . Heart disease Brother   . Heart disease Sister 63       s/p CABG  . Hypertension Sister   . Colon cancer Neg Hx     Past Surgical History:  Procedure Laterality Date  . APPENDECTOMY    . BACK SURGERY     18 back surgeries (2 thoracic & 16 lumbar) (01/31/2016)  . DILATION AND CURETTAGE OF UTERUS    . HAMMER TOE SURGERY    . IR RADIOLOGIST EVAL & MGMT  07/21/2017  . JOINT REPLACEMENT    . KNEE ARTHROSCOPY Right   . LAPAROSCOPIC CHOLECYSTECTOMY    . LUMBAR FUSION N/A 08/01/2015   Procedure: Right sided L1-2 and L2-3 transforaminal lumbar interbody fusion with cages, Extension of posterior fusion T12 to L3, Replaced pedicle screws bilaterally L1-L2 , Replaced left sided pedicle screws L-3. Instrumentation T12 to L3 using local bone graft, Vivigen allograft and cancellous chips;  Surgeon: [name], MD;  Location: [facility];  Service: Orthopedics;  Laterality: N/A;  . LUMBAR LAMINECTOMY/DECOMPRESSION MICRODISCECTOMY N/A 01/28/2014   Procedure: Minimally Invasive Right  L1-2 Microdiscectomy;  Surgeon: [name], MD;  Location: [facility];  Service: Orthopedics;  Laterality: N/A;  . TOTAL HIP ARTHROPLASTY Right   . TOTAL KNEE ARTHROPLASTY  05/29/2012   Procedure: TOTAL KNEE ARTHROPLASTY;  Surgeon: Mcarthur Rossetti, MD;  Location: [facility];  Service: Orthopedics;  Laterality: Right;  Right Total Knee Arthroplasty  . TUBAL LIGATION    . UVULOPALATOPHARYNGOPLASTY    . VAGINAL HYSTERECTOMY     Social History   Occupational History  . Occupation: disability    Comment: back surgeries  Tobacco Use  . Smoking status: Current Every Day Smoker     Packs/day: 1.25    Years: 45.00    Pack years: 56.25    Types: Cigarettes  . Smokeless tobacco: Never Used  .  Tobacco comment: form given 12/25/16  Substance and Sexual Activity  . Alcohol use: No    Alcohol/week: 0.0 oz  . Drug use: No    Types: Marijuana    Comment: 01/31/2016 "none since ~ 1980"  . Sexual activity: Never    Partners: Male

## 2018-01-30 ENCOUNTER — Other Ambulatory Visit: Payer: Self-pay | Admitting: *Deleted

## 2018-01-30 DIAGNOSIS — M4316 Spondylolisthesis, lumbar region: Secondary | ICD-10-CM

## 2018-01-30 DIAGNOSIS — M7918 Myalgia, other site: Secondary | ICD-10-CM

## 2018-01-30 MED ORDER — GABAPENTIN 300 MG PO CAPS: 300.0000 mg | ORAL_CAPSULE | Freq: Four times a day (QID) | ORAL | 5 refills | Status: DC

## 2018-01-31 ENCOUNTER — Other Ambulatory Visit: Payer: Self-pay

## 2018-01-31 ENCOUNTER — Emergency Department (HOSPITAL_COMMUNITY)
Admission: EM | Admit: 2018-01-31 | Discharge: 2018-01-31 | Disposition: A | Discharge status: Home / Self Care | Payer: Medicare Other | Attending: Emergency Medicine | Admitting: Emergency Medicine

## 2018-01-31 ENCOUNTER — Encounter (HOSPITAL_COMMUNITY): Payer: Self-pay

## 2018-01-31 DIAGNOSIS — Z79899 Other long term (current) drug therapy: Secondary | ICD-10-CM | POA: Insufficient documentation

## 2018-01-31 DIAGNOSIS — J449 Chronic obstructive pulmonary disease, unspecified: Secondary | ICD-10-CM | POA: Insufficient documentation

## 2018-01-31 DIAGNOSIS — R1033 Periumbilical pain: Secondary | ICD-10-CM | POA: Insufficient documentation

## 2018-01-31 DIAGNOSIS — I10 Essential (primary) hypertension: Secondary | ICD-10-CM | POA: Insufficient documentation

## 2018-01-31 DIAGNOSIS — E860 Dehydration: Secondary | ICD-10-CM | POA: Diagnosis not present

## 2018-01-31 DIAGNOSIS — E119 Type 2 diabetes mellitus without complications: Secondary | ICD-10-CM | POA: Insufficient documentation

## 2018-01-31 DIAGNOSIS — R1030 Lower abdominal pain, unspecified: Secondary | ICD-10-CM | POA: Diagnosis not present

## 2018-01-31 DIAGNOSIS — F17210 Nicotine dependence, cigarettes, uncomplicated: Secondary | ICD-10-CM | POA: Insufficient documentation

## 2018-01-31 DIAGNOSIS — G8929 Other chronic pain: Secondary | ICD-10-CM | POA: Diagnosis not present

## 2018-01-31 DIAGNOSIS — N3010 Interstitial cystitis (chronic) without hematuria: Secondary | ICD-10-CM | POA: Insufficient documentation

## 2018-01-31 DIAGNOSIS — R102 Pelvic and perineal pain: Secondary | ICD-10-CM

## 2018-01-31 LAB — URINALYSIS, ROUTINE W REFLEX MICROSCOPIC
Glucose, UA: NEGATIVE mg/dL
Hgb urine dipstick: NEGATIVE
Ketones, ur: 15 mg/dL — AB
Nitrite: NEGATIVE
Protein, ur: NEGATIVE mg/dL
Specific Gravity, Urine: >1.03 — ABNORMAL HIGH (ref 1.005–1.030)
pH: 5 (ref 5.0–8.0)

## 2018-01-31 LAB — URINALYSIS, MICROSCOPIC (REFLEX)

## 2018-01-31 MED ORDER — MORPHINE SULFATE (PF) 4 MG/ML IV SOLN
4.0000 mg | Freq: Once | INTRAVENOUS | Status: AC
  Administered 2018-01-31: 4 mg via INTRAMUSCULAR
  Filled 2018-01-31: qty 1

## 2018-01-31 MED ORDER — KETOROLAC TROMETHAMINE 30 MG/ML IJ SOLN
15.0000 mg | Freq: Once | INTRAMUSCULAR | Status: AC
  Administered 2018-01-31: 15 mg via INTRAMUSCULAR
  Filled 2018-01-31: qty 1

## 2018-01-31 NOTE — ED Triage Notes (Signed)
He reports chronic "bladder pain". She states years ago she was dx with interstitial cystitis. She is in no distress.

## 2018-01-31 NOTE — ED Provider Notes (Signed)
[facility] DEPT Provider Note   CSN: [id number] Arrival date & time: 01/31/18  0906     History   Chief Complaint Chief Complaint  Patient presents with  . Groin Pain    HPI Jill Phillips is a 62 y.o. female.  HPI  Patient with multiple medical issues including chronic pain, interstitial cystitis presents with ongoing frustration with her suprapubic pain, weakness. She states that she has had increasing symptoms over the past few days to weeks, minimally improved with Percocet, provided by pain medicine specialist. No remarkably new dysuria, no syncope. There is nausea, and vomiting. Patient is scheduled to see urology in 2 days, has not seen a urologist in a long time.   Past Medical History:  Diagnosis Date  . Active smoker   . Anxiety   . Calcifying tendinitis of shoulder   . Chronic back pain   . Chronic pain syndrome   . COPD (chronic obstructive pulmonary disease) ([place])   . Dysthymic disorder   . Emphysema lung ([place])   . GERD (gastroesophageal reflux disease)   . Headache    "weekly maybe" (01/31/2016)  . Heart murmur    for years, nothing to be concerned about  . Herpes genitalia   . History of blood transfusion 1980   related to "back surgery"  . Hyperlipidemia   . Hypertension   . Hypothyroidism   . Lumbago   . Osteoarthrosis, unspecified whether generalized or localized, lower leg   . Pain in joint, upper arm   . Pneumothorax, left 01/31/2016   S/P Left posterior subcostal pain injection on 01/30/2016  . PONV (postoperative nausea and vomiting)    gets nauseous  with longer surgery. Difficuty voiding after surgery  . Postlaminectomy syndrome, thoracic region   . Primary localized osteoarthrosis, lower leg   . Restless leg syndrome   . Sleep apnea    s/p surgery- last sleep study 2011- doesnt use oxygen or machine at night as instructed,.   12/2014- Dr Halford Chessman  reports it is negative.  . Thyroid disease     Patient  Active Problem List   Diagnosis Date Noted  . Type 2 diabetes mellitus without complication, without long-term current use of insulin ([place]) 12/02/2017  . Sacral pain 09/17/2017  . COPD (chronic obstructive pulmonary disease) ([place]) 04/21/2017  . Lower back pain 03/03/2017  . CKD (chronic kidney disease) stage 3, GFR 30-59 ml/min (H[facility]) 08/07/2016  . GERD (gastroesophageal reflux disease) 08/07/2016  . Chronic respiratory failure ([place]) 07/29/2016  . Hypersomnia 07/29/2016  . Arthritis of carpometacarpal [facility]) joint of left thumb 07/09/2016  . Pneumothorax on left 01/31/2016  . Chronic, continuous use of opioids 09/06/2015  . Degenerative disc disease, lumbar 08/01/2015    Class: Chronic  . Spondylolisthesis of lumbar region 08/01/2015    Class: Chronic  . Urinary, incontinence, stress female 05/06/2014  . Constipation 05/05/2014  . 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 HTN (hypertension) 05/19/2013  . HLD (hyperlipidemia) 05/19/2013  . Depression 05/19/2013  . Hypothyroidism 05/19/2013  . Tobacco abuse   . Osteoarthritis of both knees 11/18/2011  . RLS (restless legs syndrome) 11/18/2011    Past Surgical History:  Procedure Laterality Date  . APPENDECTOMY    . BACK SURGERY     18 back surgeries (2 thoracic & 16 lumbar) (01/31/2016)  . DILATION AND CURETTAGE OF UTERUS    . HAMMER TOE SURGERY    . IR RADIOLOGIST EVAL & MGMT  07/21/2017  . JOINT REPLACEMENT    .  KNEE ARTHROSCOPY Right   . LAPAROSCOPIC CHOLECYSTECTOMY    . LUMBAR FUSION N/A 08/01/2015   Procedure: Right sided L1-2 and L2-3 transforaminal lumbar interbody fusion with cages, Extension of posterior fusion T12 to L3, Replaced pedicle screws bilaterally L1-L2 , Replaced left sided pedicle screws L-3. Instrumentation T12 to L3 using local bone graft, Vivigen allograft and cancellous chips;  Surgeon: [name], MD;  Location: [facility];  Service: Orthopedics;  Laterality: N/A;  . LUMBAR LAMINECTOMY/DECOMPRESSION MICRODISCECTOMY N/A  01/28/2014   Procedure: Minimally Invasive Right  L1-2 Microdiscectomy;  Surgeon: [name], MD;  Location: [facility];  Service: Orthopedics;  Laterality: N/A;  . TOTAL HIP ARTHROPLASTY Right   . TOTAL KNEE ARTHROPLASTY  05/29/2012   Procedure: TOTAL KNEE ARTHROPLASTY;  Surgeon: Mcarthur Rossetti, MD;  [facility];  Service: Orthopedics;  Laterality: Right;  Right Total Knee Arthroplasty  . TUBAL LIGATION    . UVULOPALATOPHARYNGOPLASTY    . VAGINAL HYSTERECTOMY       OB History   None      Home Medications    Prior to Admission medications   Medication Sig Start Date End Date Taking? Authorizing Provider  albuterol (PROAIR HFA) 108 (90 Base) MCG/ACT inhaler INHALE 1 PUFF INTO THE LUNGS EVERY 6 HOURS AS NEEDED FOR WHEEZING OR SHORTNESS OF BREATH.NEED OFFICE VISIT FOR FURTHER REFILLS 09/05/16  Yes Nche, Charlene Brooke, NP  amLODipine (NORVASC) 10 MG tablet TAKE 1 TABLET BY MOUTH DAILY. 11/24/17  Yes Burns, Claudina Lick, MD  aspirin-acetaminophen-caffeine (EXCEDRIN MIGRAINE) [phone] MG tablet Take 1 tablet by mouth every 6 (six) hours as needed for headache.   Yes [provider]  atorvastatin (LIPITOR) 20 MG tablet Take 1 tablet (20 mg total) by mouth daily. 09/08/17  Yes Burns, Claudina Lick, MD  benazepril (LOTENSIN) 40 MG tablet TAKE 1 TABLET BY MOUTH DAILY. 10/10/17  Yes Burns, Claudina Lick, MD  clonazePAM (KLONOPIN) 2 MG tablet Take 1 tablet (2 mg total) by mouth 2 (two) times daily as needed for anxiety. 01/01/18  Yes Bayard Hugger, NP  diclofenac (VOLTAREN) 75 MG EC tablet TAKE 1 TABLET BY MOUTH 2 TIMES A DAY WITH A MEAL. 11/05/17  Yes Meredith Staggers, MD  diphenhydrAMINE (BENADRYL) 25 MG tablet Take 25-50 mg by mouth every 6 (six) hours as needed (bladder).   Yes [provider]  fentaNYL (DURAGESIC - DOSED MCG/HR) 50 MCG/HR Place 1 patch (50 mcg total) onto the skin every 3 (three) days. 01/01/18  Yes Bayard Hugger, NP  gabapentin (NEURONTIN) 300 MG capsule Take 1 capsule  (300 mg total) by mouth 4 (four) times daily. 01/30/18  Yes Meredith Staggers, MD  hydrochlorothiazide (HYDRODIURIL) 12.5 MG tablet TAKE 1 TABLET BY MOUTH DAILY. 11/17/17  Yes Burns, Claudina Lick, MD  levothyroxine (SYNTHROID, LEVOTHROID) 88 MCG tablet Take 1 tablet (88 mcg total) by mouth daily. 09/08/17  Yes Burns, Claudina Lick, MD  omeprazole (PRILOSEC) 40 MG capsule Take 1 capsule (40 mg total) by mouth 2 (two) times daily before a meal. 09/05/17  Yes Burns, Claudina Lick, MD  oxyCODONE-acetaminophen (PERCOCET) 10-325 MG tablet Take 1 tablet by mouth every 6 (six) hours as needed for pain. 01/01/18  Yes Bayard Hugger, NP  QUEtiapine (SEROQUEL) 100 MG tablet TAKE 1 TABLET BY MOUTH AT BEDTIME. 01/28/18  Yes Bayard Hugger, NP  rOPINIRole (REQUIP) 0.5 MG tablet Take 2 tablets (1 mg total) by mouth at bedtime. 01/01/18  Yes Bayard Hugger, NP  Sennosides (SENNA LAX PO) Take 1 tablet by mouth daily. As directed 05/20/17  Yes [provider]  SPIRIVA HANDIHALER 18 MCG inhalation capsule PLACE 1 CAPSULE INTO INHALER AND INHALE ONCE DAILY. 04/02/17  Yes Chesley Mires, MD  tiZANidine (ZANAFLEX) 4 MG tablet TAKE 1 TO 1 AND 1/2 TABLETS BY MOUTH EVERY 6 HOURS AS NEEDED FOR MUSCLE PAIN/ SPASMS 01/01/18  Yes Bayard Hugger, NP  valACYclovir (VALTREX) 500 MG tablet TAKE 1 TABLET BY MOUTH DAILY. 09/05/17  Yes Burns, Claudina Lick, MD  venlafaxine XR (EFFEXOR-XR) 150 MG 24 hr capsule TAKE 2 CAPSULES BY MOUTH DAILY. 12/22/17  Yes Burns, Claudina Lick, MD  Vitamin D, Ergocalciferol, (DRISDOL) 50000 units CAPS capsule Take 1 capsule (50,000 Units total) by mouth every 7 (seven) days. Patient not taking: Reported on 01/31/2018 03/07/17   [name], MD    Family History Family History  Problem Relation Age of Onset  . Kidney disease Mother   . Heart disease Father   . Anuerysm Brother 29       brain  . Heart disease Brother   . Heart disease Sister 26       s/p CABG  . Hypertension Sister   . Colon cancer Neg Hx     Social  History Social History   Tobacco Use  . Smoking status: Current Every Day Smoker    Packs/day: 1.25    Years: 45.00    Pack years: 56.25    Types: Cigarettes  . Smokeless tobacco: Never Used  . Tobacco comment: form given 12/25/16  Substance Use Topics  . Alcohol use: No    Alcohol/week: 0.0 oz  . Drug use: No    Types: Marijuana    Comment: 01/31/2016 "none since ~ 1980"     Allergies   Flagyl [metronidazole]; Sulfa antibiotics; Amoxicillin; Chlorzoxazone; Codeine; Darvocet [propoxyphene n-acetaminophen]; Dilaudid [hydromorphone hcl]; Keflex [cephalexin]; Morphine and related; Nitrofurantoin monohyd macro; and Percocet [oxycodone-acetaminophen]   Review of Systems Review of Systems  Constitutional:       Per HPI, otherwise negative  HENT:       Per HPI, otherwise negative  Respiratory:       Per HPI, otherwise negative  Cardiovascular:       Per HPI, otherwise negative  Gastrointestinal: Positive for abdominal pain, nausea and vomiting.  Endocrine:       Negative aside from HPI  Genitourinary:       Neg aside from HPI   Musculoskeletal:       Per HPI, otherwise negative  Skin: Negative.   Neurological: Negative for syncope.     Physical Exam Updated Vital Signs BP 94/60 (BP Location: Right Arm)   Pulse 95   Temp 98.2 F (36.8 C) (Oral)   Resp 18   SpO2 100%   Physical Exam  Constitutional: She is oriented to person, place, and time. She appears well-developed and well-nourished. No distress.  Uncomfortable appearing female sitting upright speaking clearly, in no distress  HENT:  Head: Normocephalic and atraumatic.  Eyes: Conjunctivae and EOM are normal.  Cardiovascular: Normal rate and regular rhythm.  Pulmonary/Chest: Effort normal and breath sounds normal. No stridor. No respiratory distress.  Abdominal: She exhibits no distension.  Minimal tenderness to palpation about the suprapubic region  Musculoskeletal: She exhibits no edema.  Neurological: She  is alert and oriented to person, place, and time. No cranial nerve deficit.  Skin: Skin is warm and dry.  Psychiatric: Her mood appears anxious.  Nursing note  and vitals reviewed.    ED Treatments / Results  Labs (all labs ordered are listed, but only abnormal results are displayed) Labs Reviewed  URINALYSIS, ROUTINE W REFLEX MICROSCOPIC - Abnormal; Notable for the following components:      Result Value   Color, Urine AMBER (*)    Specific Gravity, Urine >1.030 (*)    Bilirubin Urine SMALL (*)    Ketones, ur 15 (*)    Leukocytes, UA TRACE (*)    All other components within normal limits  URINALYSIS, MICROSCOPIC (REFLEX) - Abnormal; Notable for the following components:   Bacteria, UA RARE (*)    All other components within normal limits    EKG None  Radiology No results found.  Procedures Procedures (including critical care time)  Medications Ordered in ED Medications  ketorolac (TORADOL) 30 MG/ML injection 15 mg (15 mg Intramuscular Given 01/31/18 1323)  morphine 4 MG/ML injection 4 mg (4 mg Intramuscular Given 01/31/18 1323)     Initial Impression / Assessment and Plan / ED Course  I have reviewed the triage vital signs and the nursing notes.  Pertinent labs & imaging results that were available during my care of the patient were reviewed by me and considered in my medical decision making (see chart for details).   Update:, Patient appears better  2:32 PM Patient resting in the gurney, accompanied by her daughter, resting beside her.  She has no distress, states that she feels better.  When she remains afebrile. Labs reviewed with her, importance of following up with urology in 2 days discussed as well. Encouraged to drink plenty of fluids, and she is tolerant of oral intake, acknowledges importance of this.  Absent fever, distress, evidence for other acute abdominal processes, he was suspicion for an acute exacerbation of her chronic pain, the patient was  discharged in stable condition.  Final Clinical Impressions(s) / ED Diagnoses  Suprapubic pain Dehydration   Carmin Muskrat, MD 01/31/18 1433

## 2018-01-31 NOTE — Discharge Instructions (Addendum)
As discussed, today's evaluation has been generally reassuring. It is very important to drink plenty of fluids, and return here for concerning changes, otherwise be sure to follow-up with your urologist in 48 hours.

## 2018-01-31 NOTE — ED Notes (Signed)
Pt updated on plan of care

## 2018-02-02 ENCOUNTER — Encounter: Payer: Self-pay | Admitting: Physical Medicine & Rehabilitation

## 2018-02-02 ENCOUNTER — Encounter: Payer: Medicare Other | Attending: Physical Medicine & Rehabilitation | Admitting: Physical Medicine & Rehabilitation

## 2018-02-02 VITALS — BP 134/80 | HR 101 | Ht 63.0 in | Wt 153.0 lb

## 2018-02-02 DIAGNOSIS — Z9981 Dependence on supplemental oxygen: Secondary | ICD-10-CM | POA: Insufficient documentation

## 2018-02-02 DIAGNOSIS — M2170 Unequal limb length (acquired), unspecified site: Secondary | ICD-10-CM | POA: Diagnosis not present

## 2018-02-02 DIAGNOSIS — Z79899 Other long term (current) drug therapy: Secondary | ICD-10-CM | POA: Diagnosis not present

## 2018-02-02 DIAGNOSIS — E039 Hypothyroidism, unspecified: Secondary | ICD-10-CM | POA: Insufficient documentation

## 2018-02-02 DIAGNOSIS — G9619 Other disorders of meninges, not elsewhere classified: Secondary | ICD-10-CM | POA: Diagnosis not present

## 2018-02-02 DIAGNOSIS — G894 Chronic pain syndrome: Secondary | ICD-10-CM | POA: Insufficient documentation

## 2018-02-02 DIAGNOSIS — I10 Essential (primary) hypertension: Secondary | ICD-10-CM | POA: Insufficient documentation

## 2018-02-02 DIAGNOSIS — Z9889 Other specified postprocedural states: Secondary | ICD-10-CM | POA: Insufficient documentation

## 2018-02-02 DIAGNOSIS — M1812 Unilateral primary osteoarthritis of first carpometacarpal joint, left hand: Secondary | ICD-10-CM

## 2018-02-02 DIAGNOSIS — M4316 Spondylolisthesis, lumbar region: Secondary | ICD-10-CM

## 2018-02-02 DIAGNOSIS — M5136 Other intervertebral disc degeneration, lumbar region: Secondary | ICD-10-CM

## 2018-02-02 DIAGNOSIS — J449 Chronic obstructive pulmonary disease, unspecified: Secondary | ICD-10-CM | POA: Diagnosis not present

## 2018-02-02 DIAGNOSIS — M5126 Other intervertebral disc displacement, lumbar region: Secondary | ICD-10-CM | POA: Insufficient documentation

## 2018-02-02 DIAGNOSIS — E785 Hyperlipidemia, unspecified: Secondary | ICD-10-CM | POA: Diagnosis not present

## 2018-02-02 DIAGNOSIS — K219 Gastro-esophageal reflux disease without esophagitis: Secondary | ICD-10-CM | POA: Insufficient documentation

## 2018-02-02 DIAGNOSIS — G2581 Restless legs syndrome: Secondary | ICD-10-CM

## 2018-02-02 DIAGNOSIS — Z5181 Encounter for therapeutic drug level monitoring: Secondary | ICD-10-CM | POA: Insufficient documentation

## 2018-02-02 DIAGNOSIS — M170 Bilateral primary osteoarthritis of knee: Secondary | ICD-10-CM | POA: Diagnosis not present

## 2018-02-02 DIAGNOSIS — Z720 Tobacco use: Secondary | ICD-10-CM | POA: Diagnosis not present

## 2018-02-02 DIAGNOSIS — R102 Pelvic and perineal pain: Secondary | ICD-10-CM | POA: Diagnosis not present

## 2018-02-02 DIAGNOSIS — R8279 Other abnormal findings on microbiological examination of urine: Secondary | ICD-10-CM | POA: Diagnosis not present

## 2018-02-02 DIAGNOSIS — M961 Postlaminectomy syndrome, not elsewhere classified: Secondary | ICD-10-CM | POA: Diagnosis not present

## 2018-02-02 DIAGNOSIS — E079 Disorder of thyroid, unspecified: Secondary | ICD-10-CM | POA: Diagnosis not present

## 2018-02-02 MED ORDER — FENTANYL 50 MCG/HR TD PT72: 50.0000 ug | MEDICATED_PATCH | TRANSDERMAL | 0 refills | Status: DC

## 2018-02-02 MED ORDER — HYDROCODONE-ACETAMINOPHEN 10-325 MG PO TABS: 1.0000 | ORAL_TABLET | Freq: Four times a day (QID) | ORAL | 0 refills | Status: DC | PRN

## 2018-02-02 NOTE — Patient Instructions (Signed)
PLEASE FEEL FREE TO CALL OUR OFFICE WITH ANY PROBLEMS OR QUESTIONS ([phone])      

## 2018-02-02 NOTE — Progress Notes (Signed)
Subjective:    Patient ID: Jill Phillips, female    DOB: 1955/11/16, 62 y.o.   MRN: [id number]  HPI   This is a follow-up visit for Jill Phillips who is here regarding her chronic pain.  He has ongoing pain in her low back as well as in her extremities.  She was in the emergency room on Saturday with increased suprapubic pain.  This was felt to be secondary to interstitial cystitis.  She was given a shot of Toradol which apparently helped her pain at the time.  She sees urology today with full assessment to follow.  She asked about the oxycodone and its propensity to cause itching.  She is interested in changing back to hydrocodone as a result.  She has been seen by orthopedic surgery regarding her left wrist and there has been discussion about carpal tunnel syndrome.  Patient has pain in her left thumb which makes it difficult to hold and grip objects.  She denies frank numbness or weakness in the hand.  Pain sometimes radiates into the forearm and elbow.  Pain Inventory Average Pain 6 Pain Right Now 7 My pain is dull and aching  In the last 24 hours, has pain interfered with the following? General activity 6 Relation with others 6 Enjoyment of life 6 What TIME of day is your pain at its worst? na Sleep (in general) Fair  Pain is worse with: na Pain improves with: na Relief from Meds: 7  Mobility walk without assistance  Function Do you have any goals in this area?  no  Neuro/Psych numbness  Prior Studies Any changes since last visit?  no  Physicians involved in your care Any changes since last visit?  no   Family History  Problem Relation Age of Onset  . Kidney disease Mother   . Heart disease Father   . Anuerysm Brother 29       brain  . Heart disease Brother   . Heart disease Sister 76       s/p CABG  . Hypertension Sister   . Colon cancer Neg Hx    Social History   Socioeconomic History  . Marital status: Divorced    Spouse name: n/a  . Number of  children: 2  . Years of education: 12+  . Highest education level: Not on file  Occupational History  . Occupation: disability    Comment: back surgeries  Social Needs  . Financial resource strain: Not on file  . Food insecurity:    Worry: Not on file    Inability: Not on file  . Transportation needs:    Medical: Not on file    Non-medical: Not on file  Tobacco Use  . Smoking status: Current Every Day Smoker    Packs/day: 1.25    Years: 45.00    Pack years: 56.25    Types: Cigarettes  . Smokeless tobacco: Never Used  . Tobacco comment: form given 12/25/16  Substance and Sexual Activity  . Alcohol use: No    Alcohol/week: 0.0 oz  . Drug use: No    Types: Marijuana    Comment: 01/31/2016 "none since ~ 1980"  . Sexual activity: Never    Partners: Male  Lifestyle  . Physical activity:    Days per week: Not on file    Minutes per session: Not on file  . Stress: Not on file  Relationships  . Social connections:    Talks on phone: Not on file  Gets together: Not on file    Attends religious service: Not on file    Active member of club or organization: Not on file    Attends meetings of clubs or organizations: Not on file    Relationship status: Not on file  Other Topics Concern  . Not on file  Social History Narrative   Lives alone.  One daughter is local, but is getting ready to move to [place], where her children live with their father.  The other daughter lives near [place], [place].   Past Surgical History:  Procedure Laterality Date  . APPENDECTOMY    . BACK SURGERY     18 back surgeries (2 thoracic & 16 lumbar) (01/31/2016)  . DILATION AND CURETTAGE OF UTERUS    . HAMMER TOE SURGERY    . IR RADIOLOGIST EVAL & MGMT  07/21/2017  . JOINT REPLACEMENT    . KNEE ARTHROSCOPY Right   . LAPAROSCOPIC CHOLECYSTECTOMY    . LUMBAR FUSION N/A 08/01/2015   Procedure: Right sided L1-2 and L2-3 transforaminal lumbar interbody fusion with cages, Extension of posterior fusion  T12 to L3, Replaced pedicle screws bilaterally L1-L2 , Replaced left sided pedicle screws L-3. Instrumentation T12 to L3 using local bone graft, Vivigen allograft and cancellous chips;  Surgeon: [name], MD;  Location: [facility];  Service: Orthopedics;  Laterality: N/A;  . LUMBAR LAMINECTOMY/DECOMPRESSION MICRODISCECTOMY N/A 01/28/2014   Procedure: Minimally Invasive Right  L1-2 Microdiscectomy;  Surgeon: [name], MD;  Location: [facility];  Service: Orthopedics;  Laterality: N/A;  . TOTAL HIP ARTHROPLASTY Right   . TOTAL KNEE ARTHROPLASTY  05/29/2012   Procedure: TOTAL KNEE ARTHROPLASTY;  Surgeon: Mcarthur Rossetti, MD;  Location: [facility];  Service: Orthopedics;  Laterality: Right;  Right Total Knee Arthroplasty  . TUBAL LIGATION    . UVULOPALATOPHARYNGOPLASTY    . VAGINAL HYSTERECTOMY     Past Medical History:  Diagnosis Date  . Active smoker   . Anxiety   . Calcifying tendinitis of shoulder   . Chronic back pain   . Chronic pain syndrome   . COPD (chronic obstructive pulmonary disease) ([place])   . Dysthymic disorder   . Emphysema lung ([place])   . GERD (gastroesophageal reflux disease)   . Headache    "weekly maybe" (01/31/2016)  . Heart murmur    for years, nothing to be concerned about  . Herpes genitalia   . History of blood transfusion 1980   related to "back surgery"  . Hyperlipidemia   . Hypertension   . Hypothyroidism   . Lumbago   . Osteoarthrosis, unspecified whether generalized or localized, lower leg   . Pain in joint, upper arm   . Pneumothorax, left 01/31/2016   S/P Left posterior subcostal pain injection on 01/30/2016  . PONV (postoperative nausea and vomiting)    gets nauseous  with longer surgery. Difficuty voiding after surgery  . Postlaminectomy syndrome, thoracic region   . Primary localized osteoarthrosis, lower leg   . Restless leg syndrome   . Sleep apnea    s/p surgery- last sleep study 2011- doesnt use oxygen or machine at night as instructed,.    12/2014- Dr Halford Chessman  reports it is negative.  . Thyroid disease    There were no vitals taken for this visit.  Opioid Risk Score:   Fall Risk Score:  `1  Depression screen PHQ 2/9  Depression screen [facility] 2/9 01/01/2018 12/01/2017 10/10/2017 09/10/2017 06/13/2017 05/15/2016 05/02/2016  Decreased Interest 0 0  1 1 0 0 0  Down, Depressed, Hopeless 0 0 1 1 0 0 0  PHQ - 2 Score 0 0 2 2 0 0 0  Some recent data might be hidden     Review of Systems  Constitutional: Negative.   HENT: Negative.   Eyes: Negative.   Respiratory: Negative.   Cardiovascular: Negative.   Gastrointestinal: Negative.   Endocrine: Negative.   Genitourinary: Negative.   Musculoskeletal: Positive for arthralgias, back pain and myalgias.  Skin: Negative.   Allergic/Immunologic: Negative.   Neurological: Positive for numbness.  Hematological: Negative.   Psychiatric/Behavioral: Negative.   All other systems reviewed and are negative.      Objective:   Physical Exam  General: No acute distress HEENT: EOMI, oral membranes moist Cards: reg rate  Chest: normal effort Abdomen: Soft, NT, ND Skin: dry, intact Extremities: no edema Musculoskeletal: left 1st DMC tender with palpation and resisted thumb flexion. Finkelsteins' negative. Tinel's negative. Phalen's negative. No sensory findings.  LB tender to palpation. Shoulders limited with ROM Neurological: She is alert and oriented to person, place, and time.  Skin: Skin is warm and dry.  Psych in good spirits.          Assessment & Plan:  1. 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 Lisbon will call office with her decision in two weeks. Today we will continueFentanyl 50 MCG one patch every three days #10. Will change oxycodone to hydrocodone 10/325 due to pruritus from oxycodone. #120. This is a way reducing narcotics also  -We will continue the controlled substance  monitoring program, this consists of regular clinic visits, examinations, routine drug screening, pill counts as well as use of [place] Controlled Substance Reporting System. [place] today.   2. Chronic Thoracic Pain:stable 3. Osteoarthritis of left knee:No complaints Today.. S/P Cortisone Injection on 05/12/2017 by Dr. [name]. HEP 4. Rotator cuff syndrome/subacromial bursitis:maintain activity and HEP. 5. Restless legs syndrome:Continue with current treatment withRequip.Continue to monitor.   6. Tobacco Abuse:Encourage and Educated onSmoking Cessation 7. Muscle Spasm:Continue current treatment Regimen withTizanidine.  8. Insomnia:Continue current treatment regimen withSeroquel.   9. Neuropathic Pain:Continue current treatment regimen withGabapentin  10.Anxiety:Continue current treatment regimen withKlonopin. 01/01/2018 11 Chronic Rectal Pain:see discussion re: IC below 13. Vaginal Pain: Gynecologist Following. See below 14. Interstitial Cystitis: urology follow up 15. Left wrist pain: left CMC arthritis thumb. See no signs of CTS on exam  -After informed consent and preparation of the skin with betadine and isopropyl alcohol, I injected 3mg  (0.5cc) of celestone and 2cc of 1% lidocaine into the left 1st CMC via anterior approach. Additionally, aspiration was performed prior to injection. The patient tolerated well, and no complications were encountered. Afterward the area was cleaned and dressed. Post- injection instructions were provided.  Encouraged ice and ongoing splinting of the left wrist moving forward.   15 minutes of face to face patient care time was spent during this visit. All questions were encouraged and answered.  F/u in 1 month with nurse practitioner

## 2018-02-04 ENCOUNTER — Other Ambulatory Visit: Payer: Self-pay | Admitting: *Deleted

## 2018-02-04 DIAGNOSIS — M7918 Myalgia, other site: Secondary | ICD-10-CM

## 2018-02-04 DIAGNOSIS — M4316 Spondylolisthesis, lumbar region: Secondary | ICD-10-CM

## 2018-02-04 MED ORDER — GABAPENTIN 300 MG PO CAPS
300.0000 mg | ORAL_CAPSULE | Freq: Every day | ORAL | 5 refills | Status: DC
Start: 2018-02-04 — End: 2018-02-05

## 2018-02-05 ENCOUNTER — Other Ambulatory Visit: Payer: Self-pay | Admitting: *Deleted

## 2018-02-05 DIAGNOSIS — M7918 Myalgia, other site: Secondary | ICD-10-CM

## 2018-02-05 DIAGNOSIS — M4316 Spondylolisthesis, lumbar region: Secondary | ICD-10-CM

## 2018-02-05 MED ORDER — GABAPENTIN 300 MG PO CAPS: 300.0000 mg | ORAL_CAPSULE | Freq: Every day | ORAL | 5 refills | Status: DC

## 2018-02-06 ENCOUNTER — Telehealth: Payer: Self-pay | Admitting: *Deleted

## 2018-02-06 MED ORDER — CARISOPRODOL 350 MG PO TABS: 350.0000 mg | ORAL_TABLET | Freq: Three times a day (TID) | ORAL | 0 refills | Status: DC | PRN

## 2018-02-06 NOTE — Telephone Encounter (Signed)
[facility] called and she has been placed on cipro (Dr Naaman Plummer recommended Urology appt) and she cannot take Tizanidine with it due to interaction.  She is requesting a one week supply (7 day) of soma to take in the interim.

## 2018-02-06 NOTE — Telephone Encounter (Signed)
#  30 soma sent to [facility]

## 2018-02-06 NOTE — Telephone Encounter (Signed)
Kristiana notified.

## 2018-02-10 DIAGNOSIS — R350 Frequency of micturition: Secondary | ICD-10-CM | POA: Diagnosis not present

## 2018-02-10 DIAGNOSIS — R102 Pelvic and perineal pain: Secondary | ICD-10-CM | POA: Diagnosis not present

## 2018-02-20 DIAGNOSIS — R300 Dysuria: Secondary | ICD-10-CM | POA: Diagnosis not present

## 2018-02-20 DIAGNOSIS — R102 Pelvic and perineal pain: Secondary | ICD-10-CM | POA: Diagnosis not present

## 2018-02-20 DIAGNOSIS — M62838 Other muscle spasm: Secondary | ICD-10-CM | POA: Diagnosis not present

## 2018-02-20 DIAGNOSIS — M6281 Muscle weakness (generalized): Secondary | ICD-10-CM | POA: Diagnosis not present

## 2018-02-20 DIAGNOSIS — M545 Low back pain: Secondary | ICD-10-CM | POA: Diagnosis not present

## 2018-02-23 ENCOUNTER — Telehealth: Payer: Self-pay

## 2018-02-23 NOTE — Telephone Encounter (Signed)
Placed a called to [facility], Jill Phillips previous prescription was removed. Jill Phillips states she takes two capsules in the morning one capsule in the afternoon and two capsules at bedtime. Also states sometimes if she has to be somewhere she only takes one capsule in the morning. She was instructed to keep a log, she verbalizes understanding.

## 2018-02-23 NOTE — Telephone Encounter (Signed)
Pt called stating that there was suppose to be an increase for her Neurontin from 4 to 5 but pharmacy keeps giving her amount to take 4 a day. Which is she suppose to have? Not seeing it in the notes.

## 2018-02-26 ENCOUNTER — Other Ambulatory Visit: Payer: Self-pay | Admitting: Internal Medicine

## 2018-02-26 ENCOUNTER — Other Ambulatory Visit: Payer: Self-pay | Admitting: Physical Medicine & Rehabilitation

## 2018-02-27 ENCOUNTER — Encounter (HOSPITAL_BASED_OUTPATIENT_CLINIC_OR_DEPARTMENT_OTHER): Payer: Medicare Other | Admitting: Registered Nurse

## 2018-02-27 ENCOUNTER — Encounter: Payer: Medicare Other | Admitting: Neurology

## 2018-02-27 ENCOUNTER — Encounter: Payer: Self-pay | Admitting: Registered Nurse

## 2018-02-27 VITALS — BP 113/77 | HR 85 | Resp 14 | Ht 63.0 in | Wt 151.0 lb

## 2018-02-27 DIAGNOSIS — Z9981 Dependence on supplemental oxygen: Secondary | ICD-10-CM | POA: Diagnosis not present

## 2018-02-27 DIAGNOSIS — M2170 Unequal limb length (acquired), unspecified site: Secondary | ICD-10-CM | POA: Diagnosis not present

## 2018-02-27 DIAGNOSIS — I10 Essential (primary) hypertension: Secondary | ICD-10-CM | POA: Diagnosis not present

## 2018-02-27 DIAGNOSIS — Z79891 Long term (current) use of opiate analgesic: Secondary | ICD-10-CM

## 2018-02-27 DIAGNOSIS — E785 Hyperlipidemia, unspecified: Secondary | ICD-10-CM | POA: Diagnosis not present

## 2018-02-27 DIAGNOSIS — M4316 Spondylolisthesis, lumbar region: Secondary | ICD-10-CM

## 2018-02-27 DIAGNOSIS — M5126 Other intervertebral disc displacement, lumbar region: Secondary | ICD-10-CM | POA: Diagnosis not present

## 2018-02-27 DIAGNOSIS — G4709 Other insomnia: Secondary | ICD-10-CM

## 2018-02-27 DIAGNOSIS — M62830 Muscle spasm of back: Secondary | ICD-10-CM

## 2018-02-27 DIAGNOSIS — Z9889 Other specified postprocedural states: Secondary | ICD-10-CM | POA: Diagnosis not present

## 2018-02-27 DIAGNOSIS — F411 Generalized anxiety disorder: Secondary | ICD-10-CM | POA: Diagnosis not present

## 2018-02-27 DIAGNOSIS — Z79899 Other long term (current) drug therapy: Secondary | ICD-10-CM | POA: Diagnosis not present

## 2018-02-27 DIAGNOSIS — Z5181 Encounter for therapeutic drug level monitoring: Secondary | ICD-10-CM | POA: Diagnosis not present

## 2018-02-27 DIAGNOSIS — E039 Hypothyroidism, unspecified: Secondary | ICD-10-CM | POA: Diagnosis not present

## 2018-02-27 DIAGNOSIS — G2581 Restless legs syndrome: Secondary | ICD-10-CM

## 2018-02-27 DIAGNOSIS — K219 Gastro-esophageal reflux disease without esophagitis: Secondary | ICD-10-CM | POA: Diagnosis not present

## 2018-02-27 DIAGNOSIS — Z720 Tobacco use: Secondary | ICD-10-CM

## 2018-02-27 DIAGNOSIS — M5136 Other intervertebral disc degeneration, lumbar region: Secondary | ICD-10-CM | POA: Diagnosis not present

## 2018-02-27 DIAGNOSIS — G894 Chronic pain syndrome: Secondary | ICD-10-CM

## 2018-02-27 DIAGNOSIS — E079 Disorder of thyroid, unspecified: Secondary | ICD-10-CM | POA: Diagnosis not present

## 2018-02-27 DIAGNOSIS — G9619 Other disorders of meninges, not elsewhere classified: Secondary | ICD-10-CM | POA: Diagnosis not present

## 2018-02-27 DIAGNOSIS — K6289 Other specified diseases of anus and rectum: Secondary | ICD-10-CM

## 2018-02-27 DIAGNOSIS — M961 Postlaminectomy syndrome, not elsewhere classified: Secondary | ICD-10-CM

## 2018-02-27 DIAGNOSIS — J449 Chronic obstructive pulmonary disease, unspecified: Secondary | ICD-10-CM | POA: Diagnosis not present

## 2018-02-27 DIAGNOSIS — M170 Bilateral primary osteoarthritis of knee: Secondary | ICD-10-CM | POA: Diagnosis not present

## 2018-02-27 DIAGNOSIS — G8929 Other chronic pain: Secondary | ICD-10-CM

## 2018-02-27 MED ORDER — HYDROCODONE-ACETAMINOPHEN 10-325 MG PO TABS: 1.0000 | ORAL_TABLET | Freq: Four times a day (QID) | ORAL | 0 refills | Status: DC | PRN

## 2018-02-27 MED ORDER — FENTANYL 50 MCG/HR TD PT72: 50.0000 ug | MEDICATED_PATCH | TRANSDERMAL | 0 refills | Status: DC

## 2018-02-27 NOTE — Progress Notes (Signed)
Subjective:    Patient ID: Jill Phillips, female    DOB: 07-11-1956, 61 y.o.   MRN: [id number]  HPI: Jill Phillips is a 62 year old female who returns for follow up appointment for chronic pain and medication refill. She states her pain is located in her left groin and lower back radiating into her left hip. She rates her pain 5. Her current exercise regime is walking and  receiving pelvic floor exercises every two weeks.   Ms. Joost Morphine Equivalent is 160.00 MME. She is also prescribed Clonazepam.We have discussed the black box warning of using opioids and benzodiazepines. I highlighted the dangers of using these drugs together and discussed the adverse events including respiratory suppression, overdose, cognitive impairment and importance of compliance with current regimen. We will continue to monitor and adjust as indicated.   The last UDS was Performed on 11/06/2017, was inconsistent, see notes.    Pain Inventory Average Pain 4 Pain Right Now 5 My pain is burning, dull and aching  In the last 24 hours, has pain interfered with the following? General activity 5 Relation with others 4 Enjoyment of life 3 What TIME of day is your pain at its worst? daytime, evening Sleep (in general) Good  Pain is worse with: walking, sitting and some activites Pain improves with: rest, heat/ice, therapy/exercise and medication Relief from Meds: 7  Mobility walk without assistance ability to climb steps?  no do you drive?  yes transfers alone Do you have any goals in this area?  yes  Function I need assistance with the following:  meal prep, household duties and shopping Do you have any goals in this area?  yes  Neuro/Psych No problems in this area  Prior Studies Any changes since last visit?  no  Physicians involved in your care Any changes since last visit?  no   Family History  Problem Relation Age of Onset  . Kidney disease Mother   . Heart disease Father   .  Anuerysm Brother 29       brain  . Heart disease Brother   . Heart disease Sister 26       s/p CABG  . Hypertension Sister   . Colon cancer Neg Hx    Social History   Socioeconomic History  . Marital status: Divorced    Spouse name: n/a  . Number of children: 2  . Years of education: 12+  . Highest education level: Not on file  Occupational History  . Occupation: disability    Comment: back surgeries  Social Needs  . Financial resource strain: Not on file  . Food insecurity:    Worry: Not on file    Inability: Not on file  . Transportation needs:    Medical: Not on file    Non-medical: Not on file  Tobacco Use  . Smoking status: Current Every Day Smoker    Packs/day: 1.25    Years: 45.00    Pack years: 56.25    Types: Cigarettes  . Smokeless tobacco: Never Used  . Tobacco comment: form given 12/25/16  Substance and Sexual Activity  . Alcohol use: No    Alcohol/week: 0.0 oz  . Drug use: No    Types: Marijuana    Comment: 01/31/2016 "none since ~ 1980"  . Sexual activity: Never    Partners: Male  Lifestyle  . Physical activity:    Days per week: Not on file    Minutes per session: Not on  file  . Stress: Not on file  Relationships  . Social connections:    Talks on phone: Not on file    Gets together: Not on file    Attends religious service: Not on file    Active member of club or organization: Not on file    Attends meetings of clubs or organizations: Not on file    Relationship status: Not on file  Other Topics Concern  . Not on file  Social History Narrative   Lives alone.  One daughter is local, but is getting ready to move to [place], where her children live with their father.  The other daughter lives near [place], [place].   Past Surgical History:  Procedure Laterality Date  . APPENDECTOMY    . BACK SURGERY     18 back surgeries (2 thoracic & 16 lumbar) (01/31/2016)  . DILATION AND CURETTAGE OF UTERUS    . HAMMER TOE SURGERY    . IR RADIOLOGIST EVAL  & MGMT  07/21/2017  . JOINT REPLACEMENT    . KNEE ARTHROSCOPY Right   . LAPAROSCOPIC CHOLECYSTECTOMY    . LUMBAR FUSION N/A 08/01/2015   Procedure: Right sided L1-2 and L2-3 transforaminal lumbar interbody fusion with cages, Extension of posterior fusion T12 to L3, Replaced pedicle screws bilaterally L1-L2 , Replaced left sided pedicle screws L-3. Instrumentation T12 to L3 using local bone graft, Vivigen allograft and cancellous chips;  Surgeon: [name], MD;  Location: [facility];  Service: Orthopedics;  Laterality: N/A;  . LUMBAR LAMINECTOMY/DECOMPRESSION MICRODISCECTOMY N/A 01/28/2014   Procedure: Minimally Invasive Right  L1-2 Microdiscectomy;  Surgeon: [name], MD;  Location: [facility];  Service: Orthopedics;  Laterality: N/A;  . TOTAL HIP ARTHROPLASTY Right   . TOTAL KNEE ARTHROPLASTY  05/29/2012   Procedure: TOTAL KNEE ARTHROPLASTY;  Surgeon: Mcarthur Rossetti, MD;  [facility]: [facility];  Service: Orthopedics;  Laterality: Right;  Right Total Knee Arthroplasty  . TUBAL LIGATION    . UVULOPALATOPHARYNGOPLASTY    . VAGINAL HYSTERECTOMY     Past Medical History:  Diagnosis Date  . Active smoker   . Anxiety   . Calcifying tendinitis of shoulder   . Chronic back pain   . Chronic pain syndrome   . COPD (chronic obstructive pulmonary disease) ([place])   . Dysthymic disorder   . Emphysema lung ([place])   . GERD (gastroesophageal reflux disease)   . Headache    "weekly maybe" (01/31/2016)  . Heart murmur    for years, nothing to be concerned about  . Herpes genitalia   . History of blood transfusion 1980   related to "back surgery"  . Hyperlipidemia   . Hypertension   . Hypothyroidism   . Lumbago   . Osteoarthrosis, unspecified whether generalized or localized, lower leg   . Pain in joint, upper arm   . Pneumothorax, left 01/31/2016   S/P Left posterior subcostal pain injection on 01/30/2016  . PONV (postoperative nausea and vomiting)    gets nauseous  with longer surgery. Difficuty  voiding after surgery  . Postlaminectomy syndrome, thoracic region   . Primary localized osteoarthrosis, lower leg   . Restless leg syndrome   . Sleep apnea    s/p surgery- last sleep study 2011- doesnt use oxygen or machine at night as instructed,.   12/2014- Dr Halford Chessman  reports it is negative.  . Thyroid disease    BP 113/77 (BP Location: Left Arm, Patient Position: Sitting, Cuff Size: Normal)   Pulse 85  Resp 14   Ht 5\' 3"  (1.6 m)   Wt 151 lb (68.5 kg)   SpO2 91%   BMI 26.75 kg/m   Opioid Risk Score:   Fall Risk Score:  `1  Depression screen PHQ 2/9  Depression screen [facility] 2/9 01/01/2018 12/01/2017 10/10/2017 09/10/2017 06/13/2017 05/15/2016 05/02/2016  Decreased Interest 0 0 1 1 0 0 0  Down, Depressed, Hopeless 0 0 1 1 0 0 0  PHQ - 2 Score 0 0 2 2 0 0 0  Some recent data might be hidden    Review of Systems  Constitutional: Negative.   HENT: Negative.   Eyes: Negative.   Respiratory: Negative.   Cardiovascular: Negative.   Gastrointestinal: Negative.   Endocrine: Negative.   Genitourinary: Negative.   Musculoskeletal: Positive for arthralgias and back pain.  Skin: Negative.   Hematological: Negative.   Psychiatric/Behavioral: Negative.        Objective:   Physical Exam  Constitutional: She is oriented to person, place, and time. She appears well-developed and well-nourished.  HENT:  Head: Normocephalic and atraumatic.  Neck: Normal range of motion. Neck supple.  Cardiovascular: Normal rate and regular rhythm.  Pulmonary/Chest: Effort normal and breath sounds normal.  Musculoskeletal:  Normal Muscle Bulk and Muscle Testing Reveals:  Upper Extremities: Full ROM and Muscle Strength 5/5 Lumbar Paraspinal Tenderness: L-3-L-5 Left Greater Trochanter Tenderness Lower Extremities: Full ROM and Muscle Strength 5/5 Arises from Table with ease Narrow Based Gait  Neurological: She is alert and oriented to person, place, and time.  Skin: Skin is warm and dry.  Psychiatric: She  has a normal mood and affect. Her behavior is normal.  Nursing note and vitals reviewed.         Assessment & Plan:  1. 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 We will continue the opioid monitoring program, this consists of regular clinic visits, examinations, urine drug screen, pill counts as well as use of [place] Controlled Substance Reporting System. 2. Chronic Thoracic Pain:No complaints Today.Continue current medication regime and Continue to Monitor.02/27/18 3. Osteoarthritis of left knee:No complaints Today.. S/P Cortisone Injection on 05/12/2017 by Dr. [name]. Continue with Heat, exercise and voltaren.02/27/2018 4. Rotator cuff syndrome/subacromial bursitis: No complaints voiced today. Continue to Monitor. 02/27/2018. 5. Restless legs syndrome:Continue with current treatment withRequip.Continue to monitor. 02/27/2018 6. Tobacco Abuse:Encourage and Educated onSmoking Cessation: PCP prescribed Nicotine Patches : 02/27/2018 7. Muscle Spasm:Continue current treatment Regimen withTizanidine. 02/27/2018 8. Insomnia:Continue current treatment regimen withSeroquel. 02/27/2018 9. Neuropathic Pain:Continue current treatment regimen withGabapentin.02/27/2018 10.Anxiety:Continue current treatment regimen withKlonopin. 02/27/2018 11 Chronic Rectal Pain:Continue Pelvic Floor Therapy. Dr. [name] Following. 02/27/2018  30 minutes of face to face patient care time was spent during this visit. All questions were encouraged and answered.  F/u in 1 month

## 2018-03-03 LAB — DRUG TOX MONITOR 1 W/CONF, ORAL FLD
Amphetamines: NEGATIVE ng/mL (ref <10)
Barbiturates: NEGATIVE ng/mL (ref <10)
Benzodiazepines: NEGATIVE ng/mL (ref <0.50)
Buprenorphine: NEGATIVE ng/mL (ref <0.10)
Buprenorphine: NEGATIVE ng/mL (ref <0.10)
Cocaine: NEGATIVE ng/mL (ref <5.0)
Codeine: NEGATIVE ng/mL (ref <2.5)
Cotinine: 41.2 ng/mL — ABNORMAL HIGH (ref <5.0)
Dihydrocodeine: 3.3 ng/mL — ABNORMAL HIGH (ref <2.5)
Fentanyl: 2.84 ng/mL — ABNORMAL HIGH (ref <0.10)
Fentanyl: POSITIVE ng/mL — AB (ref <0.10)
Heroin Metabolite: NEGATIVE ng/mL (ref <1.0)
Hydrocodone: 60.6 ng/mL — ABNORMAL HIGH (ref <2.5)
Hydromorphone: NEGATIVE ng/mL (ref <2.5)
MARIJUANA: NEGATIVE ng/mL (ref <2.5)
MDMA: NEGATIVE ng/mL (ref <10)
Meprobamate: NEGATIVE ng/mL (ref <2.5)
Methadone: NEGATIVE ng/mL (ref <5.0)
Morphine: NEGATIVE ng/mL (ref <2.5)
Naloxone: NEGATIVE ng/mL (ref <0.25)
Nicotine Metabolite: POSITIVE ng/mL — AB (ref <5.0)
Norbuprenorphine: NEGATIVE ng/mL (ref <0.50)
Norhydrocodone: 3.5 ng/mL — ABNORMAL HIGH (ref <2.5)
Noroxycodone: NEGATIVE ng/mL (ref <2.5)
Opiates: POSITIVE ng/mL — AB (ref <2.5)
Oxycodone: NEGATIVE ng/mL (ref <2.5)
Oxymorphone: NEGATIVE ng/mL (ref <2.5)
Phencyclidine: NEGATIVE ng/mL (ref <10)
Tapentadol: NEGATIVE ng/mL (ref <5.0)
Tramadol: NEGATIVE ng/mL (ref <5.0)
Zolpidem: NEGATIVE ng/mL (ref <5.0)

## 2018-03-03 LAB — DRUG TOX ALC METAB W/CON, ORAL FLD: Alcohol Metabolite: NEGATIVE ng/mL (ref <25)

## 2018-03-04 ENCOUNTER — Telehealth: Payer: Self-pay | Admitting: *Deleted

## 2018-03-04 NOTE — Telephone Encounter (Signed)
Oral swab drug screen was consistent for prescribed medications.  ?

## 2018-03-06 ENCOUNTER — Other Ambulatory Visit: Payer: Self-pay | Admitting: Internal Medicine

## 2018-03-06 ENCOUNTER — Ambulatory Visit: Payer: Medicare Other | Admitting: Internal Medicine

## 2018-03-06 DIAGNOSIS — M6281 Muscle weakness (generalized): Secondary | ICD-10-CM | POA: Diagnosis not present

## 2018-03-06 DIAGNOSIS — R102 Pelvic and perineal pain: Secondary | ICD-10-CM | POA: Diagnosis not present

## 2018-03-06 DIAGNOSIS — R300 Dysuria: Secondary | ICD-10-CM | POA: Diagnosis not present

## 2018-03-06 DIAGNOSIS — M62838 Other muscle spasm: Secondary | ICD-10-CM | POA: Diagnosis not present

## 2018-03-20 DIAGNOSIS — R300 Dysuria: Secondary | ICD-10-CM | POA: Diagnosis not present

## 2018-03-20 DIAGNOSIS — M62838 Other muscle spasm: Secondary | ICD-10-CM | POA: Diagnosis not present

## 2018-03-20 DIAGNOSIS — M6208 Separation of muscle (nontraumatic), other site: Secondary | ICD-10-CM | POA: Diagnosis not present

## 2018-03-20 DIAGNOSIS — M6281 Muscle weakness (generalized): Secondary | ICD-10-CM | POA: Diagnosis not present

## 2018-03-20 DIAGNOSIS — R102 Pelvic and perineal pain: Secondary | ICD-10-CM | POA: Diagnosis not present

## 2018-03-24 ENCOUNTER — Other Ambulatory Visit: Payer: Self-pay | Admitting: Registered Nurse

## 2018-03-24 DIAGNOSIS — M7918 Myalgia, other site: Secondary | ICD-10-CM

## 2018-03-24 DIAGNOSIS — M4316 Spondylolisthesis, lumbar region: Secondary | ICD-10-CM

## 2018-03-27 ENCOUNTER — Encounter: Payer: Self-pay | Admitting: Registered Nurse

## 2018-03-27 ENCOUNTER — Encounter: Payer: Medicare Other | Attending: Physical Medicine & Rehabilitation | Admitting: Registered Nurse

## 2018-03-27 VITALS — BP 105/71 | HR 82 | Ht 63.0 in | Wt 154.0 lb

## 2018-03-27 DIAGNOSIS — I10 Essential (primary) hypertension: Secondary | ICD-10-CM | POA: Diagnosis not present

## 2018-03-27 DIAGNOSIS — M5136 Other intervertebral disc degeneration, lumbar region: Secondary | ICD-10-CM

## 2018-03-27 DIAGNOSIS — M961 Postlaminectomy syndrome, not elsewhere classified: Secondary | ICD-10-CM

## 2018-03-27 DIAGNOSIS — E785 Hyperlipidemia, unspecified: Secondary | ICD-10-CM | POA: Diagnosis not present

## 2018-03-27 DIAGNOSIS — K219 Gastro-esophageal reflux disease without esophagitis: Secondary | ICD-10-CM | POA: Diagnosis not present

## 2018-03-27 DIAGNOSIS — M62830 Muscle spasm of back: Secondary | ICD-10-CM | POA: Diagnosis not present

## 2018-03-27 DIAGNOSIS — Z720 Tobacco use: Secondary | ICD-10-CM | POA: Diagnosis not present

## 2018-03-27 DIAGNOSIS — Z5181 Encounter for therapeutic drug level monitoring: Secondary | ICD-10-CM

## 2018-03-27 DIAGNOSIS — Z79891 Long term (current) use of opiate analgesic: Secondary | ICD-10-CM

## 2018-03-27 DIAGNOSIS — E079 Disorder of thyroid, unspecified: Secondary | ICD-10-CM | POA: Insufficient documentation

## 2018-03-27 DIAGNOSIS — M2170 Unequal limb length (acquired), unspecified site: Secondary | ICD-10-CM | POA: Insufficient documentation

## 2018-03-27 DIAGNOSIS — M170 Bilateral primary osteoarthritis of knee: Secondary | ICD-10-CM | POA: Diagnosis not present

## 2018-03-27 DIAGNOSIS — M4316 Spondylolisthesis, lumbar region: Secondary | ICD-10-CM | POA: Diagnosis not present

## 2018-03-27 DIAGNOSIS — Z9981 Dependence on supplemental oxygen: Secondary | ICD-10-CM | POA: Diagnosis not present

## 2018-03-27 DIAGNOSIS — E039 Hypothyroidism, unspecified: Secondary | ICD-10-CM | POA: Diagnosis not present

## 2018-03-27 DIAGNOSIS — G2581 Restless legs syndrome: Secondary | ICD-10-CM | POA: Diagnosis not present

## 2018-03-27 DIAGNOSIS — G9619 Other disorders of meninges, not elsewhere classified: Secondary | ICD-10-CM | POA: Insufficient documentation

## 2018-03-27 DIAGNOSIS — Z9889 Other specified postprocedural states: Secondary | ICD-10-CM | POA: Insufficient documentation

## 2018-03-27 DIAGNOSIS — J449 Chronic obstructive pulmonary disease, unspecified: Secondary | ICD-10-CM | POA: Diagnosis not present

## 2018-03-27 DIAGNOSIS — M5126 Other intervertebral disc displacement, lumbar region: Secondary | ICD-10-CM | POA: Insufficient documentation

## 2018-03-27 DIAGNOSIS — G894 Chronic pain syndrome: Secondary | ICD-10-CM | POA: Insufficient documentation

## 2018-03-27 DIAGNOSIS — F411 Generalized anxiety disorder: Secondary | ICD-10-CM

## 2018-03-27 DIAGNOSIS — Z79899 Other long term (current) drug therapy: Secondary | ICD-10-CM | POA: Insufficient documentation

## 2018-03-27 DIAGNOSIS — G4709 Other insomnia: Secondary | ICD-10-CM

## 2018-03-27 MED ORDER — QUETIAPINE FUMARATE 100 MG PO TABS
100.0000 mg | ORAL_TABLET | Freq: Every day | ORAL | 2 refills | Status: DC
Start: 2018-03-27 — End: 2018-05-13

## 2018-03-27 MED ORDER — FENTANYL 50 MCG/HR TD PT72
50.0000 ug | MEDICATED_PATCH | TRANSDERMAL | 0 refills | Status: DC
Start: 2018-03-27 — End: 2018-04-27

## 2018-03-27 MED ORDER — HYDROCODONE-ACETAMINOPHEN 10-325 MG PO TABS: 1.0000 | ORAL_TABLET | Freq: Four times a day (QID) | ORAL | 0 refills | Status: DC | PRN

## 2018-03-27 MED ORDER — CLONAZEPAM 2 MG PO TABS: 2.0000 mg | ORAL_TABLET | Freq: Two times a day (BID) | ORAL | 2 refills | Status: DC | PRN

## 2018-03-27 NOTE — Progress Notes (Signed)
Subjective:    Patient ID: Jill Phillips, female    DOB: [birth year], 62 y.o.   MRN: [id number]  HPI: [name] is a 62 year old female who returns for follow up appointment for chronic pain and medication refill. She states her pain is located in her lower back and pelvic pain. She rates her pain 4. Her current exercise regime walking and she's going to physical therapy for pelvic floor exercises.   Jill Phillips reports she's  having left shoulder pain that has been waxing and waning radiating into her left arm and left elbow with tingling. At this time she denies any pain at this time.   Jill Phillips Morphine Equivalent is 160.00 MME. She is also prescribed by  Clonazepam. We have discussed the black box warning of using opioids and benzodiazepines. I highlighted the dangers of using these drugs together and discussed the adverse events including respiratory suppression, overdose, cognitive impairment and importance of compliance with current regimen. We will continue to monitor and adjust as indicated.   Pain Inventory Average Pain 5 Pain Right Now 4 My pain is sharp and aching  In the last 24 hours, has pain interfered with the following? General activity 4 Relation with others 4 Enjoyment of life 2 What TIME of day is your pain at its worst? daytime Sleep (in general) Fair  Pain is worse with: walking and some activites Pain improves with: rest, heat/ice, therapy/exercise, pacing activities and medication Relief from Meds: 4  Mobility Do you have any goals in this area?  no  Function disabled: date disabled .  Neuro/Psych No problems in this area  Prior Studies Any changes since last visit?  no  Physicians involved in your care Any changes since last visit?  no   Family History  Problem Relation Age of Onset  . Kidney disease Mother   . Heart disease Father   . Anuerysm Brother 29       brain  . Heart disease Brother   . Heart disease Sister 37       s/p  CABG  . Hypertension Sister   . Colon cancer Neg Hx    Social History   Socioeconomic History  . Marital status: Divorced    Spouse name: n/a  . Number of children: 2  . Years of education: 12+  . Highest education level: Not on file  Occupational History  . Occupation: disability    Comment: back surgeries  Social Needs  . Financial resource strain: Not on file  . Food insecurity:    Worry: Not on file    Inability: Not on file  . Transportation needs:    Medical: Not on file    Non-medical: Not on file  Tobacco Use  . Smoking status: Current Every Day Smoker    Packs/day: 1.25    Years: 45.00    Pack years: 56.25    Types: Cigarettes  . Smokeless tobacco: Never Used  . Tobacco comment: form given 12/25/16  Substance and Sexual Activity  . Alcohol use: No    Alcohol/week: 0.0 oz  . Drug use: No    Types: Marijuana    Comment: 01/31/2016 "none since ~ 1980"  . Sexual activity: Never    Partners: Male  Lifestyle  . Physical activity:    Days per week: Not on file    Minutes per session: Not on file  . Stress: Not on file  Relationships  . Social connections:  Talks on phone: Not on file    Gets together: Not on file    Attends religious service: Not on file    Active member of club or organization: Not on file    Attends meetings of clubs or organizations: Not on file    Relationship status: Not on file  Other Topics Concern  . Not on file  Social History Narrative   Lives alone.  One daughter is local, but is getting ready to move to [place], where her children live with their father.  The other daughter lives near [place], [place].   Past Surgical History:  Procedure Laterality Date  . APPENDECTOMY    . BACK SURGERY     18 back surgeries (2 thoracic & 16 lumbar) (01/31/2016)  . DILATION AND CURETTAGE OF UTERUS    . HAMMER TOE SURGERY    . IR RADIOLOGIST EVAL & MGMT  07/21/2017  . JOINT REPLACEMENT    . KNEE ARTHROSCOPY Right   . LAPAROSCOPIC  CHOLECYSTECTOMY    . LUMBAR FUSION N/A 08/01/2015   Procedure: Right sided L1-2 and L2-3 transforaminal lumbar interbody fusion with cages, Extension of posterior fusion T12 to L3, Replaced pedicle screws bilaterally L1-L2 , Replaced left sided pedicle screws L-3. Instrumentation T12 to L3 using local bone graft, Vivigen allograft and cancellous chips;  Surgeon: [name], MD;  Location: [facility];  Service: Orthopedics;  Laterality: N/A;  . LUMBAR LAMINECTOMY/DECOMPRESSION MICRODISCECTOMY N/A 01/28/2014   Procedure: Minimally Invasive Right  L1-2 Microdiscectomy;  Surgeon: [name], MD;  Location: [facility];  Service: Orthopedics;  Laterality: N/A;  . TOTAL HIP ARTHROPLASTY Right   . TOTAL KNEE ARTHROPLASTY  05/29/2012   Procedure: TOTAL KNEE ARTHROPLASTY;  Surgeon: Mcarthur Rossetti, MD;  Location: [facility];  Service: Orthopedics;  Laterality: Right;  Right Total Knee Arthroplasty  . TUBAL LIGATION    . UVULOPALATOPHARYNGOPLASTY    . VAGINAL HYSTERECTOMY     Past Medical History:  Diagnosis Date  . Active smoker   . Anxiety   . Calcifying tendinitis of shoulder   . Chronic back pain   . Chronic pain syndrome   . COPD (chronic obstructive pulmonary disease) ([facility])   . Dysthymic disorder   . Emphysema lung ([facility])   . GERD (gastroesophageal reflux disease)   . Headache    "weekly maybe" (01/31/2016)  . Heart murmur    for years, nothing to be concerned about  . Herpes genitalia   . History of blood transfusion 1980   related to "back surgery"  . Hyperlipidemia   . Hypertension   . Hypothyroidism   . Lumbago   . Osteoarthrosis, unspecified whether generalized or localized, lower leg   . Pain in joint, upper arm   . Pneumothorax, left 01/31/2016   S/P Left posterior subcostal pain injection on 01/30/2016  . PONV (postoperative nausea and vomiting)    gets nauseous  with longer surgery. Difficuty voiding after surgery  . Postlaminectomy syndrome, thoracic region   . Primary  localized osteoarthrosis, lower leg   . Restless leg syndrome   . Sleep apnea    s/p surgery- last sleep study 2011- doesnt use oxygen or machine at night as instructed,.   12/2014- Dr Halford Chessman  reports it is negative.  . Thyroid disease    BP 105/71   Pulse 82   Ht 5\' 3"  (1.6 m)   Wt 154 lb (69.9 kg)   SpO2 91%   BMI 27.28 kg/m   Opioid  Risk Score:   Fall Risk Score:  `1  Depression screen PHQ 2/9  Depression screen [facility] 2/9 01/01/2018 12/01/2017 10/10/2017 09/10/2017 06/13/2017 05/15/2016 05/02/2016  Decreased Interest 0 0 1 1 0 0 0  Down, Depressed, Hopeless 0 0 1 1 0 0 0  PHQ - 2 Score 0 0 2 2 0 0 0  Some recent data might be hidden     Review of Systems  Constitutional: Negative.   HENT: Negative.   Eyes: Negative.   Respiratory: Negative.   Cardiovascular: Negative.   Gastrointestinal: Negative.   Endocrine: Negative.   Genitourinary: Negative.   Musculoskeletal: Positive for arthralgias, back pain and myalgias.  Skin: Negative.   Allergic/Immunologic: Negative.   Neurological: Negative.   Hematological: Negative.   Psychiatric/Behavioral: Negative.   All other systems reviewed and are negative.      Objective:   Physical Exam  Constitutional: She is oriented to person, place, and time. She appears well-developed and well-nourished.  HENT:  Head: Normocephalic and atraumatic.  Neck: Normal range of motion. Neck supple.  Cardiovascular: Normal rate and regular rhythm.  Pulmonary/Chest: Effort normal and breath sounds normal.  Musculoskeletal:  Normal Muscle Bulk and Muscle Testing Reveals: Upper Extremities: Full ROM and Muscle Strength 5/5 Spinal Forward Flexion 90 Degrees and Extension 10 Degrees Lumbar Paraspinal Tenderness: L-3-L-5 Lower Extremities: Full ROM and Muscle Strength 5/5 Arises from Table with Ease Narrow Based Gait  Neurological: She is alert and oriented to person, place, and time.  Skin: Skin is warm and dry.  Psychiatric: She has a normal mood  and affect.  Nursing note and vitals reviewed.         Assessment & Plan:  1. Lumbar Degenerative Disc/Chronic lumbar spine pain/post-lami syndrome: 03/27/2018 Refilled:Fentanyl 50 MCG one patch every three days #10 and Hydrocodone 10/325 mg one tablet every 6 hours as needed for pain #120.We will continue the opioid monitoring program, this consists of regular clinic visits, examinations, urine drug screen, pill counts as well as use of [place] Controlled Substance Reporting System. 2. Chronic Thoracic Pain:No complaints Today.Continue current medication regime and Continue to Monitor.03/27/18 3. Osteoarthritis of left knee:No complaints Today.. S/P Cortisone Injection on 05/12/2017 by Dr. [name]. Continue with Heat, exercise and voltaren.03/27/2018 4. Rotator cuff syndrome/subacromial bursitis: No complaints voiced today. Continue to Monitor. 03/27/2018. 5. Restless legs syndrome:Continue with current treatment withRequip.Continue to monitor. 03/27/2018 6. Tobacco Abuse:Encourage and Educated onSmoking Cessation: PCP prescribed Nicotine Patches : 03/27/2018 7. Muscle Spasm:Continue current treatment Regimen withTizanidine. 03/27/2018 8. Insomnia:Continue current treatment regimen withSeroquel. 03/27/2018 9. Neuropathic Pain:Continue current treatment regimen withGabapentin.03/27/2018 10.Anxiety:Continue current treatment regimen withKlonopin. 03/27/2018 11 Chronic Rectal Pain:Continue Pelvic Floor Therapy. Dr. [name] Following. 03/27/2018  20 minutes of face to face patient care time was spent during this visit. All questions were encouraged and answered.  F/u in 1 month

## 2018-04-01 ENCOUNTER — Ambulatory Visit (INDEPENDENT_AMBULATORY_CARE_PROVIDER_SITE_OTHER): Payer: Medicare Other | Admitting: Specialist

## 2018-04-01 ENCOUNTER — Encounter (INDEPENDENT_AMBULATORY_CARE_PROVIDER_SITE_OTHER): Payer: Self-pay | Admitting: Specialist

## 2018-04-01 ENCOUNTER — Ambulatory Visit (INDEPENDENT_AMBULATORY_CARE_PROVIDER_SITE_OTHER): Payer: Self-pay

## 2018-04-01 VITALS — BP 124/81 | HR 98 | Ht 63.0 in | Wt 153.0 lb

## 2018-04-01 DIAGNOSIS — M542 Cervicalgia: Secondary | ICD-10-CM | POA: Diagnosis not present

## 2018-04-01 DIAGNOSIS — M779 Enthesopathy, unspecified: Secondary | ICD-10-CM

## 2018-04-01 DIAGNOSIS — M19041 Primary osteoarthritis, right hand: Secondary | ICD-10-CM

## 2018-04-01 DIAGNOSIS — M778 Other enthesopathies, not elsewhere classified: Secondary | ICD-10-CM

## 2018-04-01 DIAGNOSIS — M79641 Pain in right hand: Secondary | ICD-10-CM

## 2018-04-01 DIAGNOSIS — M4722 Other spondylosis with radiculopathy, cervical region: Secondary | ICD-10-CM

## 2018-04-01 MED ORDER — DICLOFENAC SODIUM 1 % TD GEL: 2.0000 g | Freq: Four times a day (QID) | TRANSDERMAL | 2 refills | Status: DC

## 2018-04-01 MED ORDER — METHYLPREDNISOLONE 4 MG PO TBPK: ORAL_TABLET | ORAL | 0 refills | Status: DC

## 2018-04-01 NOTE — Progress Notes (Signed)
Office Visit Note   Patient: Jill Phillips           Date of Birth: Jan 08, 1956           MRN: [id number] Visit Date: 04/01/2018              Requested by: [name], MD [place], [place] [address] PCP: [name], MD   Assessment & Plan: Visit Diagnoses:  1. Cervicalgia   2. Pain of right hand   3. Primary osteoarthritis, right hand   4. Tendonitis of right hand   5. Other spondylosis with radiculopathy, cervical region     Plan: Start medrol dose pak for arthritis in the hands and the neck. See and occupational therapist for the arthritis in your thumbs and flexor tendonitis Use voltaren gel locally for both thumbs and the flexor tendon 4 times a day.  Follow-Up Instructions: Return in about 3 weeks (around 04/22/2018).   Orders:  Orders Placed This Encounter  Procedures  . XR Cervical Spine 2 or 3 views  . XR Hand Complete Right   No orders of the defined types were placed in this encounter.     Procedures: No procedures performed   Clinical Data: No additional findings.   Subjective: Chief Complaint  Patient presents with  . Neck - Pain    Neck pain that radiates into the left arm down to her elbow.  . Right Hand - Pain    States there is a knot in the hand causing pain with any use    HPI  Review of Systems   Objective: Vital Signs: BP 124/81 (BP Location: Left Arm, Patient Position: Sitting)   Pulse 98   Ht 5\' 3"  (1.6 m)   Wt 153 lb (69.4 kg)   BMI 27.10 kg/m   Physical Exam  Ortho Exam  Specialty Comments:  No specialty comments available.  Imaging: No results found.   PMFS History: Patient Active Problem List   Diagnosis Date Noted  . Degenerative disc disease, lumbar 08/01/2015    Priority: High    Class: Chronic  . Spondylolisthesis of lumbar region 08/01/2015    Priority: High    Class: Chronic  . Type 2 diabetes mellitus without complication, without long-term current use of insulin ([facility]) 12/02/2017    . Sacral pain 09/17/2017  . COPD (chronic obstructive pulmonary disease) ([facility]) 04/21/2017  . Lower back pain 03/03/2017  . CKD (chronic kidney disease) stage 3, GFR 30-59 ml/min ([facility]) 08/07/2016  . GERD (gastroesophageal reflux disease) 08/07/2016  . Chronic respiratory failure ([facility] [place]) 07/29/2016  . Hypersomnia 07/29/2016  . Arthritis of carpometacarpal [facility]) joint of left thumb 07/09/2016  . Pneumothorax on left 01/31/2016  . Chronic, continuous use of opioids 09/06/2015  . Urinary, incontinence, stress female 05/06/2014  . Constipation 05/05/2014  . 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 HTN (hypertension) 05/19/2013  . HLD (hyperlipidemia) 05/19/2013  . Depression 05/19/2013  . Hypothyroidism 05/19/2013  . Tobacco abuse   . Osteoarthritis of both knees 11/18/2011  . RLS (restless legs syndrome) 11/18/2011   Past Medical History:  Diagnosis Date  . Active smoker   . Anxiety   . Calcifying tendinitis of shoulder   . Chronic back pain   . Chronic pain syndrome   . COPD (chronic obstructive pulmonary disease) ([facility])   . Dysthymic disorder   . Emphysema lung ([place])   . GERD (gastroesophageal reflux disease)   . Headache    "weekly maybe" (01/31/2016)  .  Heart murmur    for years, nothing to be concerned about  . Herpes genitalia   . History of blood transfusion 1980   related to "back surgery"  . Hyperlipidemia   . Hypertension   . Hypothyroidism   . Lumbago   . Osteoarthrosis, unspecified whether generalized or localized, lower leg   . Pain in joint, upper arm   . Pneumothorax, left 01/31/2016   S/P Left posterior subcostal pain injection on 01/30/2016  . PONV (postoperative nausea and vomiting)    gets nauseous  with longer surgery. Difficuty voiding after surgery  . Postlaminectomy syndrome, thoracic region   . Primary localized osteoarthrosis, lower leg   . Restless leg syndrome   . Sleep apnea    s/p surgery- last sleep study 2011- doesnt use oxygen or machine at night as  instructed,.   12/2014- Dr Halford Chessman  reports it is negative.  . Thyroid disease     Family History  Problem Relation Age of Onset  . Kidney disease Mother   . Heart disease Father   . Anuerysm Brother 29       brain  . Heart disease Brother   . Heart disease Sister 72       s/p CABG  . Hypertension Sister   . Colon cancer Neg Hx     Past Surgical History:  Procedure Laterality Date  . APPENDECTOMY    . BACK SURGERY     18 back surgeries (2 thoracic & 16 lumbar) (01/31/2016)  . DILATION AND CURETTAGE OF UTERUS    . HAMMER TOE SURGERY    . IR RADIOLOGIST EVAL & MGMT  07/21/2017  . JOINT REPLACEMENT    . KNEE ARTHROSCOPY Right   . LAPAROSCOPIC CHOLECYSTECTOMY    . LUMBAR FUSION N/A 08/01/2015   Procedure: Right sided L1-2 and L2-3 transforaminal lumbar interbody fusion with cages, Extension of posterior fusion T12 to L3, Replaced pedicle screws bilaterally L1-L2 , Replaced left sided pedicle screws L-3. Instrumentation T12 to L3 using local bone graft, Vivigen allograft and cancellous chips;  Surgeon: [name], MD;  Location: [facility];  Service: Orthopedics;  Laterality: N/A;  . LUMBAR LAMINECTOMY/DECOMPRESSION MICRODISCECTOMY N/A 01/28/2014   Procedure: Minimally Invasive Right  L1-2 Microdiscectomy;  Surgeon: [name], MD;  Location: [facility];  Service: Orthopedics;  Laterality: N/A;  . TOTAL HIP ARTHROPLASTY Right   . TOTAL KNEE ARTHROPLASTY  05/29/2012   Procedure: TOTAL KNEE ARTHROPLASTY;  Surgeon: Mcarthur Rossetti, MD;  Location: [facility];  Service: Orthopedics;  Laterality: Right;  Right Total Knee Arthroplasty  . TUBAL LIGATION    . UVULOPALATOPHARYNGOPLASTY    . VAGINAL HYSTERECTOMY     Social History   Occupational History  . Occupation: disability    Comment: back surgeries  Tobacco Use  . Smoking status: Current Every Day Smoker    Packs/day: 1.25    Years: 45.00    Pack years: 56.25    Types: Cigarettes  . Smokeless tobacco: Never Used  . Tobacco comment:  form given 12/25/16  Substance and Sexual Activity  . Alcohol use: No    Alcohol/week: 0.0 oz  . Drug use: No    Types: Marijuana    Comment: 01/31/2016 "none since ~ 1980"  . Sexual activity: Never    Partners: Male

## 2018-04-01 NOTE — Patient Instructions (Addendum)
Start medrol dose pak for arthritis in the hands and the neck. See and occupational therapist for the arthritis in your thumbs and flexor tendonitis Use voltaren gel locally for both thumbs and the flexor tendon 4 times a day. Stop oral diclofenac while on the steroids and restart 2-3  Days after stopping the steroid dose pak.

## 2018-04-08 ENCOUNTER — Telehealth (INDEPENDENT_AMBULATORY_CARE_PROVIDER_SITE_OTHER): Payer: Self-pay | Admitting: Specialist

## 2018-04-08 NOTE — Telephone Encounter (Signed)
Patient left a vm message that Dr. [name] was suppose to order a CT scan.  She wanted to know if that has been taken care of.  OI#[phone].  Thank you

## 2018-04-09 ENCOUNTER — Telehealth: Payer: Self-pay | Admitting: *Deleted

## 2018-04-09 NOTE — Telephone Encounter (Signed)
Jill Phillips called and says that she knocked her bottles off her night table and her trashcan was sitting in front of the table. She thought she found them all and checked trash, but apparently her klonopin was in trash and was thrown out.  She has searched everywhere and cannot find the bottle. She is requesting a refill on the amt of pills that should be left for the month so that she does not go into withdrawal.  Her last fill was 03/28/18.  Please advise.

## 2018-04-10 NOTE — Telephone Encounter (Signed)
I am willing to refill these. However, since they were written on 7/26 the pharmacy will need to be contacted to give clearance. There are two refills I believe on the current rx.   Please remind Jill Phillips that we can help this time, but that I will be unable to do this again if she loses her meds in the future.   Thanks

## 2018-04-10 NOTE — Telephone Encounter (Signed)
Patient left a vm message that Dr. [name] was suppose to order a CT scan for her neck.   She is also wanting a CT scan for her thoracic spine.  I called and advised her that I do not see any mention of a CT scan in his ov note. And advised that he did not evaluate her for the thoracic spine that day so I'm not sure that he could order a scan of that area.  But that he was out of the office till Wednesday and I would send him a message and let her know what he says.  I did advise that there was an order for OT for her hand.

## 2018-04-10 NOTE — Telephone Encounter (Signed)
Pt is calling again today about her Klonopin.

## 2018-04-13 ENCOUNTER — Ambulatory Visit: Payer: Medicare Other | Admitting: Occupational Therapy

## 2018-04-13 NOTE — Telephone Encounter (Signed)
I contacted patients pharmacy and submitted a bridge script for klonopin, #34, 17 day supply that will carry her to her next eligible refill.  The pharmacy has her next 2 refills on file.  I contacted the patient and informed that she will have to self-pay.  The cost was less than $10.00.  I educated patient to be very careful with her medications, ie. Keep away from sinks, toilets, garbage cans, etc....etc..[name]

## 2018-04-20 ENCOUNTER — Other Ambulatory Visit: Payer: Self-pay | Admitting: Physical Medicine & Rehabilitation

## 2018-04-27 ENCOUNTER — Telehealth: Payer: Self-pay | Admitting: *Deleted

## 2018-04-27 ENCOUNTER — Telehealth: Payer: Self-pay | Admitting: Registered Nurse

## 2018-04-27 DIAGNOSIS — M4316 Spondylolisthesis, lumbar region: Secondary | ICD-10-CM

## 2018-04-27 DIAGNOSIS — M5136 Other intervertebral disc degeneration, lumbar region: Secondary | ICD-10-CM

## 2018-04-27 MED ORDER — HYDROCODONE-ACETAMINOPHEN 10-325 MG PO TABS: 1.0000 | ORAL_TABLET | Freq: Four times a day (QID) | ORAL | 0 refills | Status: DC | PRN

## 2018-04-27 MED ORDER — FENTANYL 50 MCG/HR TD PT72: 50.0000 ug | MEDICATED_PATCH | TRANSDERMAL | 0 refills | Status: DC

## 2018-04-27 NOTE — Telephone Encounter (Signed)
See note

## 2018-04-27 NOTE — Telephone Encounter (Signed)
Jill Phillips called about her appointment tomorrow @12 :42.  She has no transportation, because her daughter was in an automobile accident and cannot bring her and she has no one else.  Her other transport (insurance) requires at least 3 day advanced notice.  She needs her medication.  Please advise.

## 2018-04-27 NOTE — Telephone Encounter (Signed)
Ms. Teodoro has transportation issues. According to the PMP Aware Website Fentanyl was filled on 03/28/2018 and Hydrocodone o7/27/2019. Prescriptions e-scribe and appointment changed to 05/13/2018 at 1:00 pm

## 2018-04-27 NOTE — Telephone Encounter (Signed)
Patient difficulty with transportation for tomorrow. Patient rescheduled appointment for tomorrow until Thursday. She needs her meds asap

## 2018-04-28 ENCOUNTER — Encounter: Payer: Medicare Other | Admitting: Registered Nurse

## 2018-04-29 ENCOUNTER — Ambulatory Visit (INDEPENDENT_AMBULATORY_CARE_PROVIDER_SITE_OTHER): Payer: Medicare Other | Admitting: Specialist

## 2018-04-30 ENCOUNTER — Ambulatory Visit (INDEPENDENT_AMBULATORY_CARE_PROVIDER_SITE_OTHER): Payer: Medicare Other | Admitting: Specialist

## 2018-04-30 ENCOUNTER — Ambulatory Visit: Payer: Medicare Other | Admitting: Registered Nurse

## 2018-05-11 ENCOUNTER — Other Ambulatory Visit: Payer: Self-pay | Admitting: Internal Medicine

## 2018-05-13 ENCOUNTER — Other Ambulatory Visit: Payer: Self-pay | Admitting: Internal Medicine

## 2018-05-13 ENCOUNTER — Encounter: Payer: Medicare Other | Attending: Physical Medicine & Rehabilitation | Admitting: Registered Nurse

## 2018-05-13 ENCOUNTER — Encounter: Payer: Self-pay | Admitting: Registered Nurse

## 2018-05-13 VITALS — BP 145/88 | HR 90 | Ht 63.0 in | Wt 152.0 lb

## 2018-05-13 DIAGNOSIS — G894 Chronic pain syndrome: Secondary | ICD-10-CM

## 2018-05-13 DIAGNOSIS — M961 Postlaminectomy syndrome, not elsewhere classified: Secondary | ICD-10-CM

## 2018-05-13 DIAGNOSIS — I10 Essential (primary) hypertension: Secondary | ICD-10-CM | POA: Diagnosis not present

## 2018-05-13 DIAGNOSIS — G9619 Other disorders of meninges, not elsewhere classified: Secondary | ICD-10-CM | POA: Insufficient documentation

## 2018-05-13 DIAGNOSIS — K219 Gastro-esophageal reflux disease without esophagitis: Secondary | ICD-10-CM | POA: Insufficient documentation

## 2018-05-13 DIAGNOSIS — M5136 Other intervertebral disc degeneration, lumbar region: Secondary | ICD-10-CM | POA: Diagnosis not present

## 2018-05-13 DIAGNOSIS — G2581 Restless legs syndrome: Secondary | ICD-10-CM | POA: Diagnosis not present

## 2018-05-13 DIAGNOSIS — J449 Chronic obstructive pulmonary disease, unspecified: Secondary | ICD-10-CM | POA: Diagnosis not present

## 2018-05-13 DIAGNOSIS — M62830 Muscle spasm of back: Secondary | ICD-10-CM | POA: Diagnosis not present

## 2018-05-13 DIAGNOSIS — E079 Disorder of thyroid, unspecified: Secondary | ICD-10-CM | POA: Insufficient documentation

## 2018-05-13 DIAGNOSIS — Z79899 Other long term (current) drug therapy: Secondary | ICD-10-CM | POA: Diagnosis not present

## 2018-05-13 DIAGNOSIS — E785 Hyperlipidemia, unspecified: Secondary | ICD-10-CM | POA: Insufficient documentation

## 2018-05-13 DIAGNOSIS — F411 Generalized anxiety disorder: Secondary | ICD-10-CM

## 2018-05-13 DIAGNOSIS — Z720 Tobacco use: Secondary | ICD-10-CM

## 2018-05-13 DIAGNOSIS — M2170 Unequal limb length (acquired), unspecified site: Secondary | ICD-10-CM | POA: Diagnosis not present

## 2018-05-13 DIAGNOSIS — M5126 Other intervertebral disc displacement, lumbar region: Secondary | ICD-10-CM | POA: Insufficient documentation

## 2018-05-13 DIAGNOSIS — Z9981 Dependence on supplemental oxygen: Secondary | ICD-10-CM | POA: Insufficient documentation

## 2018-05-13 DIAGNOSIS — Z79891 Long term (current) use of opiate analgesic: Secondary | ICD-10-CM

## 2018-05-13 DIAGNOSIS — M170 Bilateral primary osteoarthritis of knee: Secondary | ICD-10-CM | POA: Diagnosis not present

## 2018-05-13 DIAGNOSIS — G4709 Other insomnia: Secondary | ICD-10-CM

## 2018-05-13 DIAGNOSIS — E039 Hypothyroidism, unspecified: Secondary | ICD-10-CM | POA: Insufficient documentation

## 2018-05-13 DIAGNOSIS — M4316 Spondylolisthesis, lumbar region: Secondary | ICD-10-CM

## 2018-05-13 DIAGNOSIS — Z5181 Encounter for therapeutic drug level monitoring: Secondary | ICD-10-CM | POA: Diagnosis not present

## 2018-05-13 DIAGNOSIS — Z9889 Other specified postprocedural states: Secondary | ICD-10-CM | POA: Diagnosis not present

## 2018-05-13 DIAGNOSIS — M51369 Other intervertebral disc degeneration, lumbar region without mention of lumbar back pain or lower extremity pain: Secondary | ICD-10-CM

## 2018-05-13 MED ORDER — CLONAZEPAM 2 MG PO TABS: 2.0000 mg | ORAL_TABLET | Freq: Two times a day (BID) | ORAL | 2 refills | Status: DC | PRN

## 2018-05-13 MED ORDER — HYDROCODONE-ACETAMINOPHEN 10-325 MG PO TABS: 1.0000 | ORAL_TABLET | Freq: Four times a day (QID) | ORAL | 0 refills | Status: DC | PRN

## 2018-05-13 MED ORDER — QUETIAPINE FUMARATE 100 MG PO TABS: 100.0000 mg | ORAL_TABLET | Freq: Every day | ORAL | 2 refills | Status: DC

## 2018-05-13 MED ORDER — FENTANYL 50 MCG/HR TD PT72: 50.0000 ug | MEDICATED_PATCH | TRANSDERMAL | 0 refills | Status: DC

## 2018-05-13 NOTE — Progress Notes (Signed)
Subjective:    Patient ID: Jill Phillips, female    DOB: April 03, 1956, 62 y.o.   MRN: [id number]  HPI: Jill Phillips is a 62 year old female who returns for follow up appointment for chronic pain and medication refill. She states her pain is located in her bilateral thumbs R>L with complaints of right thumb trigger finger, she has an appointment with Dr. [name] next week. Also reports right thumb pain radiates into her right arm with tingling and burning and left upper extremity with feeling of being  Achy, denies chest pain or SOB., lower back pain and left knee pain. She rates her pain 5. Her current exercise regime is walking.   Jill Phillips Morphine Equivalent is 160.00 MME.  She is also prescribed clonazepam.We have discussed the black box warning of using opioids and benzodiazepines. I highlighted the dangers of using these drugs together and discussed the adverse events including respiratory suppression, overdose, cognitive impairment and importance of compliance with current regimen. We will continue to monitor and adjust as indicated.   Last Oral Swab was Performed on 02/27/2018, it was consistent.   Pain Inventory Average Pain 4 Pain Right Now 5 My pain is tingling and aching  In the last 24 hours, has pain interfered with the following? General activity na Relation with others na Enjoyment of life na What TIME of day is your pain at its worst? na Sleep (in general) na  Pain is worse with: na Pain improves with: na Relief from Meds: na  Mobility walk without assistance Do you have any goals in this area?  no  Function Do you have any goals in this area?  no  Neuro/Psych No problems in this area  Prior Studies Any changes since last visit?  no  Physicians involved in your care Any changes since last visit?  no   Family History  Problem Relation Age of Onset  . Kidney disease Mother   . Heart disease Father   . Anuerysm Brother 29       brain  . Heart  disease Brother   . Heart disease Sister 72       s/p CABG  . Hypertension Sister   . Colon cancer Neg Hx    Social History   Socioeconomic History  . Marital status: Divorced    Spouse name: n/a  . Number of children: 2  . Years of education: 12+  . Highest education level: Not on file  Occupational History  . Occupation: disability    Comment: back surgeries  Social Needs  . Financial resource strain: Not on file  . Food insecurity:    Worry: Not on file    Inability: Not on file  . Transportation needs:    Medical: Not on file    Non-medical: Not on file  Tobacco Use  . Smoking status: Current Every Day Smoker    Packs/day: 1.25    Years: 45.00    Pack years: 56.25    Types: Cigarettes  . Smokeless tobacco: Never Used  . Tobacco comment: form given 12/25/16  Substance and Sexual Activity  . Alcohol use: No    Alcohol/week: 0.0 standard drinks  . Drug use: No    Types: Marijuana    Comment: 01/31/2016 "none since ~ 1980"  . Sexual activity: Never    Partners: Male  Lifestyle  . Physical activity:    Days per week: Not on file    Minutes per session: Not on  file  . Stress: Not on file  Relationships  . Social connections:    Talks on phone: Not on file    Gets together: Not on file    Attends religious service: Not on file    Active member of club or organization: Not on file    Attends meetings of clubs or organizations: Not on file    Relationship status: Not on file  Other Topics Concern  . Not on file  Social History Narrative   Lives alone.  One daughter is local, but is getting ready to move to [place], where her children live with their father.  The other daughter lives near [place], [place].   Past Surgical History:  Procedure Laterality Date  . APPENDECTOMY    . BACK SURGERY     18 back surgeries (2 thoracic & 16 lumbar) (01/31/2016)  . DILATION AND CURETTAGE OF UTERUS    . HAMMER TOE SURGERY    . IR RADIOLOGIST EVAL & MGMT  07/21/2017  . JOINT  REPLACEMENT    . KNEE ARTHROSCOPY Right   . LAPAROSCOPIC CHOLECYSTECTOMY    . LUMBAR FUSION N/A 08/01/2015   Procedure: Right sided L1-2 and L2-3 transforaminal lumbar interbody fusion with cages, Extension of posterior fusion T12 to L3, Replaced pedicle screws bilaterally L1-L2 , Replaced left sided pedicle screws L-3. Instrumentation T12 to L3 using local bone graft, Vivigen allograft and cancellous chips;  Surgeon: [name], MD;  Location: [facility];  Service: Orthopedics;  Laterality: N/A;  . LUMBAR LAMINECTOMY/DECOMPRESSION MICRODISCECTOMY N/A 01/28/2014   Procedure: Minimally Invasive Right  L1-2 Microdiscectomy;  Surgeon: [name], MD;  Location: [facility];  Service: Orthopedics;  Laterality: N/A;  . TOTAL HIP ARTHROPLASTY Right   . TOTAL KNEE ARTHROPLASTY  05/29/2012   Procedure: TOTAL KNEE ARTHROPLASTY;  Surgeon: Mcarthur Rossetti, MD;  Location: [facility];  Service: Orthopedics;  Laterality: Right;  Right Total Knee Arthroplasty  . TUBAL LIGATION    . UVULOPALATOPHARYNGOPLASTY    . VAGINAL HYSTERECTOMY     Past Medical History:  Diagnosis Date  . Active smoker   . Anxiety   . Calcifying tendinitis of shoulder   . Chronic back pain   . Chronic pain syndrome   . COPD (chronic obstructive pulmonary disease) ([place])   . Dysthymic disorder   . Emphysema lung ([place])   . GERD (gastroesophageal reflux disease)   . Headache    "weekly maybe" (01/31/2016)  . Heart murmur    for years, nothing to be concerned about  . Herpes genitalia   . History of blood transfusion 1980   related to "back surgery"  . Hyperlipidemia   . Hypertension   . Hypothyroidism   . Lumbago   . Osteoarthrosis, unspecified whether generalized or localized, lower leg   . Pain in joint, upper arm   . Pneumothorax, left 01/31/2016   S/P Left posterior subcostal pain injection on 01/30/2016  . PONV (postoperative nausea and vomiting)    gets nauseous  with longer surgery. Difficuty voiding after surgery  .  Postlaminectomy syndrome, thoracic region   . Primary localized osteoarthrosis, lower leg   . Restless leg syndrome   . Sleep apnea    s/p surgery- last sleep study 2011- doesnt use oxygen or machine at night as instructed,.   12/2014- Dr Halford Chessman  reports it is negative.  . Thyroid disease    There were no vitals taken for this visit.  Opioid Risk Score:   Fall Risk  Score:  `1  Depression screen PHQ 2/9  Depression screen [facility] 2/9 01/01/2018 12/01/2017 10/10/2017 09/10/2017 06/13/2017 05/15/2016 05/02/2016  Decreased Interest 0 0 1 1 0 0 0  Down, Depressed, Hopeless 0 0 1 1 0 0 0  PHQ - 2 Score 0 0 2 2 0 0 0  Some recent data might be hidden     Review of Systems  Constitutional: Negative.   HENT: Negative.   Eyes: Negative.   Respiratory: Negative.   Cardiovascular: Negative.   Gastrointestinal: Negative.   Endocrine: Negative.   Genitourinary: Negative.   Musculoskeletal: Positive for arthralgias and myalgias.  Skin: Negative.   Allergic/Immunologic: Negative.   Neurological: Negative.   Hematological: Negative.   Psychiatric/Behavioral: Negative.   All other systems reviewed and are negative.      Objective:   Physical Exam  Constitutional: She is oriented to person, place, and time. She appears well-developed and well-nourished.  HENT:  Head: Normocephalic and atraumatic.  Neck: Normal range of motion. Neck supple.  Cardiovascular: Normal rate and regular rhythm.  Pulmonary/Chest: Effort normal and breath sounds normal.  Musculoskeletal:  Normal Muscle Bulk and Muscle Testing Reveals: Upper Extremities: Full ROM and Muscle Strength 4/5 Lumbar Paraspinal Tenderness: L-4-L-5 Lower Extremities: Full ROM and Muscle Strength 5/5 Arises from Table with ease Narrow Based Gait   Neurological: She is alert and oriented to person, place, and time.  Skin: Skin is warm and dry.  Psychiatric: She has a normal mood and affect. Her behavior is normal.  Nursing note and vitals  reviewed.         Assessment & Plan:  1. Lumbar Degenerative Disc/Chronic lumbar spine pain/post-lami syndrome: 05/13/2018 Refilled:Fentanyl 50 MCG one patch every three days #10 andHydrocodone10/325 mg one tablet every 6 hours as needed for pain #120.We will continue the opioid monitoring program, this consists of regular clinic visits, examinations, urine drug screen, pill counts as well as use of [place] Controlled Substance Reporting System. 2. Chronic Thoracic Pain:No complaints Today.Continue current medication regime and Continue to Monitor.05/13/18 3. Osteoarthritis of left knee:Continue with Heat, exercise and voltaren.05/13/2018 4. Rotator cuff syndrome/subacromial bursitis: No complaints voiced today. Continue to Monitor. 05/13/2018. 5. Restless legs syndrome:Continue with current treatment withRequip.Continue to monitor. 05/13/2018 6. Tobacco Abuse:Encourage and Educated onSmoking Cessation: PCP prescribed Nicotine Patches : 05/13/2018 7. Muscle Spasm:Continue current treatment Regimen withTizanidine. 05/13/2018 8. Insomnia:Continue current treatment regimen withSeroquel. 05/13/2018 9. Neuropathic Pain:Continue current treatment regimen withGabapentin.05/13/2018 10.Anxiety:Continue current treatment regimen withKlonopin. 05/13/2018 11 Chronic Rectal Pain: No complaints today.Continue Pelvic Floor Therapy.Dr. [name] Following.05/13/2018  20 minutes of face to face patient care time was spent during this visit. All questions were encouraged and answered.  F/u in 1 month

## 2018-05-20 ENCOUNTER — Ambulatory Visit (INDEPENDENT_AMBULATORY_CARE_PROVIDER_SITE_OTHER): Payer: Medicare Other | Admitting: Specialist

## 2018-05-20 ENCOUNTER — Other Ambulatory Visit: Payer: Self-pay | Admitting: Internal Medicine

## 2018-05-20 DIAGNOSIS — I10 Essential (primary) hypertension: Secondary | ICD-10-CM

## 2018-05-25 DIAGNOSIS — M180 Bilateral primary osteoarthritis of first carpometacarpal joints: Secondary | ICD-10-CM | POA: Diagnosis not present

## 2018-05-25 DIAGNOSIS — M65311 Trigger thumb, right thumb: Secondary | ICD-10-CM | POA: Diagnosis not present

## 2018-06-03 NOTE — Progress Notes (Signed)
Subjective:    Patient ID: Jill Phillips, female    DOB: 1955-10-12, 62 y.o.   MRN: [id number]  HPI The patient is here for follow up.  Hypertension: She is taking her medication daily. She is compliant with a low sodium diet.  She denies chest pain, palpitations, and lightheadedness. She is not exercising regularly.     Hyperlipidemia: She is taking her medication daily. She is compliant with a low fat/cholesterol diet. She is not exercising regularly. She denies myalgias.   Diabetes: She is taking her medication daily as prescribed. She is not compliant with a diabetic diet. She is not exercising regularly.  She denies foot lesions. She is up-to-date with an ophthalmology examination.   Hypothyroidism:  She is taking her medication daily.  She has no energy at all.  She denies any recent changes in energy or weight that are unexplained.   GERD:  She is taking her medication daily as prescribed.  She denies any GERD symptoms and feels her GERD is well controlled.   CKD:  she takes diclofenac daily.  She drinks some water during the day.    COPD: She has had a couple instances of difficulty breathing which makes her very anxious.  She wondered about getting a prescription of Xanax, which she has taken in the past as needed.  She is still smoking, but states she has cut down.  Medications and allergies reviewed with patient and updated if appropriate.  Patient Active Problem List   Diagnosis Date Noted  . Type 2 diabetes mellitus without complication, without long-term current use of insulin ([place]) 12/02/2017  . Sacral pain 09/17/2017  . COPD (chronic obstructive pulmonary disease) ([place]) 04/21/2017  . Lower back pain 03/03/2017  . CKD (chronic kidney disease) stage 3, GFR 30-59 ml/min ([facility]) 08/07/2016  . GERD (gastroesophageal reflux disease) 08/07/2016  . Chronic respiratory failure ([place]) 07/29/2016  . Hypersomnia 07/29/2016  . Arthritis of carpometacarpal [facility]) joint of left  thumb 07/09/2016  . Pneumothorax on left 01/31/2016  . Chronic, continuous use of opioids 09/06/2015  . Degenerative disc disease, lumbar 08/01/2015    Class: Chronic  . Spondylolisthesis of lumbar region 08/01/2015    Class: Chronic  . Urinary, incontinence, stress female 05/06/2014  . Constipation 05/05/2014  . 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 HTN (hypertension) 05/19/2013  . HLD (hyperlipidemia) 05/19/2013  . Depression 05/19/2013  . Hypothyroidism 05/19/2013  . Tobacco abuse   . Osteoarthritis of both knees 11/18/2011  . RLS (restless legs syndrome) 11/18/2011    Current Outpatient Medications on File Prior to Visit  Medication Sig Dispense Refill  . albuterol (PROAIR HFA) 108 (90 Base) MCG/ACT inhaler INHALE 1 PUFF INTO THE LUNGS EVERY 6 HOURS AS NEEDED FOR WHEEZING OR SHORTNESS OF BREATH.NEED OFFICE VISIT FOR FURTHER REFILLS 8.5 g 0  . amLODipine (NORVASC) 10 MG tablet TAKE 1 TABLET BY MOUTH DAILY. 90 tablet 0  . aspirin-acetaminophen-caffeine (EXCEDRIN MIGRAINE) 250-250-65 MG tablet Take 1 tablet by mouth every 6 (six) hours as needed for headache.    [name] atorvastatin (LIPITOR) 20 MG tablet Take 1 tablet (20 mg total) by mouth daily. 90 tablet 3  . benazepril (LOTENSIN) 40 MG tablet TAKE 1 TABLET BY MOUTH DAILY. 90 tablet 1  . clonazePAM (KLONOPIN) 2 MG tablet Take 1 tablet (2 mg total) by mouth 2 (two) times daily as needed for anxiety. 60 tablet 2  . diclofenac (VOLTAREN) 75 MG EC tablet TAKE 1 TABLET BY MOUTH 2 TIMES A  DAY WITH A MEAL. 60 tablet 3  . diclofenac sodium (VOLTAREN) 1 % GEL Apply 2 g topically 4 (four) times daily. 5 Tube 2  . diphenhydrAMINE (BENADRYL) 25 MG tablet Take 25-50 mg by mouth every 6 (six) hours as needed (bladder).    . fentaNYL (DURAGESIC - DOSED MCG/HR) 50 MCG/HR Place 1 patch (50 mcg total) onto the skin every 3 (three) days. 10 patch 0  . gabapentin (NEURONTIN) 300 MG capsule Take 1 capsule (300 mg total) by mouth 5 (five) times daily. 150 capsule 5    . hydrochlorothiazide (HYDRODIURIL) 12.5 MG tablet TAKE 1 TABLET BY MOUTH DAILY. 90 tablet 1  . HYDROcodone-acetaminophen (NORCO) 10-325 MG tablet Take 1 tablet by mouth every 6 (six) hours as needed. 120 tablet 0  . levothyroxine (SYNTHROID, LEVOTHROID) 88 MCG tablet TAKE 1 TABLET BY MOUTH DAILY. 90 tablet 1  . Meth-Hyo-M Bl-Na Phos-Ph Sal (URIBEL) 118 MG CAPS Take by mouth 3 (three) times daily.     [name] omeprazole (PRILOSEC) 40 MG capsule Take 1 capsule (40 mg total) by mouth 2 (two) times daily before a meal. 180 capsule 3  . QUEtiapine (SEROQUEL) 100 MG tablet Take 1 tablet (100 mg total) by mouth at bedtime. 30 tablet 2  . rOPINIRole (REQUIP) 0.5 MG tablet TAKE 2 TABLETS BY MOUTH AT BEDTIME. 60 tablet 5  . Sennosides (SENNA LAX PO) Take 1 tablet by mouth daily. As directed    . SPIRIVA HANDIHALER 18 MCG inhalation capsule PLACE 1 CAPSULE INTO INHALER AND INHALE ONCE DAILY. 30 capsule 0  . tiZANidine (ZANAFLEX) 4 MG tablet TAKE 1 TO 1 AND 1/2 TABLETS BY MOUTH EVERY 6 HOURS AS NEEDED FOR MUSCLE PAIN/ SPASMS 150 tablet 3  . valACYclovir (VALTREX) 500 MG tablet TAKE 1 TABLET BY MOUTH DAILY. 30 tablet 5  . venlafaxine XR (EFFEXOR-XR) 150 MG 24 hr capsule TAKE 2 CAPSULES BY MOUTH DAILY. 60 capsule 5  . Vitamin D, Ergocalciferol, (DRISDOL) 50000 units CAPS capsule Take 1 capsule (50,000 Units total) by mouth every 7 (seven) days. 8 capsule 0   Current Facility-Administered Medications on File Prior to Visit  Medication Dose Route Frequency Provider Last Rate Last Dose  . calcitonin (salmon) (MIACALCIN/FORTICAL) nasal spray 1 spray  1 spray Alternating Nares Daily [name], MD        Past Medical History:  Diagnosis Date  . Active smoker   . Anxiety   . Calcifying tendinitis of shoulder   . Chronic back pain   . Chronic pain syndrome   . COPD (chronic obstructive pulmonary disease) ([place])   . Dysthymic disorder   . Emphysema lung ([place])   . GERD (gastroesophageal reflux disease)   .  Headache    "weekly maybe" (01/31/2016)  . Heart murmur    for years, nothing to be concerned about  . Herpes genitalia   . History of blood transfusion 1980   related to "back surgery"  . Hyperlipidemia   . Hypertension   . Hypothyroidism   . Lumbago   . Osteoarthrosis, unspecified whether generalized or localized, lower leg   . Pain in joint, upper arm   . Pneumothorax, left 01/31/2016   S/P Left posterior subcostal pain injection on 01/30/2016  . PONV (postoperative nausea and vomiting)    gets nauseous  with longer surgery. Difficuty voiding after surgery  . Postlaminectomy syndrome, thoracic region   . Primary localized osteoarthrosis, lower leg   . Restless leg syndrome   . Sleep apnea  s/p surgery- last sleep study 2011- doesnt use oxygen or machine at night as instructed,.   12/2014- Dr Halford Chessman  reports it is negative.  . Thyroid disease     Past Surgical History:  Procedure Laterality Date  . APPENDECTOMY    . BACK SURGERY     18 back surgeries (2 thoracic & 16 lumbar) (01/31/2016)  . DILATION AND CURETTAGE OF UTERUS    . HAMMER TOE SURGERY    . IR RADIOLOGIST EVAL & MGMT  07/21/2017  . JOINT REPLACEMENT    . KNEE ARTHROSCOPY Right   . LAPAROSCOPIC CHOLECYSTECTOMY    . LUMBAR FUSION N/A 08/01/2015   Procedure: Right sided L1-2 and L2-3 transforaminal lumbar interbody fusion with cages, Extension of posterior fusion T12 to L3, Replaced pedicle screws bilaterally L1-L2 , Replaced left sided pedicle screws L-3. Instrumentation T12 to L3 using local bone graft, Vivigen allograft and cancellous chips;  Surgeon: [name], MD;  Location: [facility];  Service: Orthopedics;  Laterality: N/A;  . LUMBAR LAMINECTOMY/DECOMPRESSION MICRODISCECTOMY N/A 01/28/2014   Procedure: Minimally Invasive Right  L1-2 Microdiscectomy;  Surgeon: [name], MD;  Location: [facility];  Service: Orthopedics;  Laterality: N/A;  . TOTAL HIP ARTHROPLASTY Right   . TOTAL KNEE ARTHROPLASTY  05/29/2012    Procedure: TOTAL KNEE ARTHROPLASTY;  Surgeon: Mcarthur Rossetti, MD;  Location: [facility];  Service: Orthopedics;  Laterality: Right;  Right Total Knee Arthroplasty  . TUBAL LIGATION    . UVULOPALATOPHARYNGOPLASTY    . VAGINAL HYSTERECTOMY      Social History   Socioeconomic History  . Marital status: Divorced    Spouse name: n/a  . Number of children: 2  . Years of education: 12+  . Highest education level: Not on file  Occupational History  . Occupation: disability    Comment: back surgeries  Social Needs  . Financial resource strain: Not on file  . Food insecurity:    Worry: Not on file    Inability: Not on file  . Transportation needs:    Medical: Not on file    Non-medical: Not on file  Tobacco Use  . Smoking status: Current Every Day Smoker    Packs/day: 1.25    Years: 45.00    Pack years: 56.25    Types: Cigarettes  . Smokeless tobacco: Never Used  . Tobacco comment: form given 12/25/16  Substance and Sexual Activity  . Alcohol use: No    Alcohol/week: 0.0 standard drinks  . Drug use: No    Types: Marijuana    Comment: 01/31/2016 "none since ~ 1980"  . Sexual activity: Never    Partners: Male  Lifestyle  . Physical activity:    Days per week: Not on file    Minutes per session: Not on file  . Stress: Not on file  Relationships  . Social connections:    Talks on phone: Not on file    Gets together: Not on file    Attends religious service: Not on file    Active member of club or organization: Not on file    Attends meetings of clubs or organizations: Not on file    Relationship status: Not on file  Other Topics Concern  . Not on file  Social History Narrative   Lives alone.  One daughter is local, but is getting ready to move to [place], where her children live with their father.  The other daughter lives near [place], [place].    Family History  Problem Relation Age of Onset  . Kidney disease Mother   . Heart disease Father   . Anuerysm Brother 29         brain  . Heart disease Brother   . Heart disease Sister 43       s/p CABG  . Hypertension Sister   . Colon cancer Neg Hx     Review of Systems  Constitutional: Positive for fatigue. Negative for chills and fever.  Respiratory: Positive for shortness of breath and wheezing. Negative for cough.   Cardiovascular: Positive for leg swelling (mild - left ankle - takes hctz Q 4-5 days). Negative for chest pain and palpitations.  Neurological: Positive for headaches. Negative for light-headedness.       Objective:   Vitals:   06/04/18 1047  BP: 134/82  Pulse: 92  Resp: 16  Temp: 98.7 F (37.1 C)  SpO2: 94%   BP Readings from Last 3 Encounters:  06/04/18 134/82  05/13/18 (!) 145/88  04/01/18 124/81   Wt Readings from Last 3 Encounters:  06/04/18 151 lb 12.8 oz (68.9 kg)  05/13/18 152 lb (68.9 kg)  04/01/18 153 lb (69.4 kg)   Body mass index is 26.89 kg/m.   Physical Exam    Constitutional: Appears well-developed and well-nourished. No distress.  HENT:  Head: Normocephalic and atraumatic.  Neck: Neck supple. No tracheal deviation present. No thyromegaly present.  No cervical lymphadenopathy Cardiovascular: Normal rate, regular rhythm and normal heart sounds.   No murmur heard. No carotid bruit .  No edema Pulmonary/Chest: Effort normal and breath sounds diffusely decreased-chronic. No respiratory distress. No has no wheezes. No rales.  Skin: Skin is warm and dry. Not diaphoretic.  Psychiatric: Normal mood and affect. Behavior is normal.      Assessment & Plan:    See Problem List for Assessment and Plan of chronic medical problems.

## 2018-06-04 ENCOUNTER — Encounter: Payer: Self-pay | Admitting: Internal Medicine

## 2018-06-04 ENCOUNTER — Ambulatory Visit (INDEPENDENT_AMBULATORY_CARE_PROVIDER_SITE_OTHER): Payer: Medicare Other | Admitting: Specialist

## 2018-06-04 ENCOUNTER — Other Ambulatory Visit: Payer: Self-pay | Admitting: Internal Medicine

## 2018-06-04 ENCOUNTER — Other Ambulatory Visit (INDEPENDENT_AMBULATORY_CARE_PROVIDER_SITE_OTHER): Payer: Medicare Other

## 2018-06-04 ENCOUNTER — Ambulatory Visit (INDEPENDENT_AMBULATORY_CARE_PROVIDER_SITE_OTHER): Payer: Medicare Other | Admitting: Internal Medicine

## 2018-06-04 VITALS — BP 134/82 | HR 92 | Temp 98.7°F | Resp 16 | Ht 63.0 in | Wt 151.8 lb

## 2018-06-04 DIAGNOSIS — J439 Emphysema, unspecified: Secondary | ICD-10-CM

## 2018-06-04 DIAGNOSIS — E038 Other specified hypothyroidism: Secondary | ICD-10-CM

## 2018-06-04 DIAGNOSIS — N183 Chronic kidney disease, stage 3 unspecified: Secondary | ICD-10-CM

## 2018-06-04 DIAGNOSIS — N189 Chronic kidney disease, unspecified: Secondary | ICD-10-CM

## 2018-06-04 DIAGNOSIS — E119 Type 2 diabetes mellitus without complications: Secondary | ICD-10-CM

## 2018-06-04 DIAGNOSIS — I10 Essential (primary) hypertension: Secondary | ICD-10-CM

## 2018-06-04 DIAGNOSIS — R600 Localized edema: Secondary | ICD-10-CM

## 2018-06-04 DIAGNOSIS — K219 Gastro-esophageal reflux disease without esophagitis: Secondary | ICD-10-CM

## 2018-06-04 DIAGNOSIS — Z23 Encounter for immunization: Secondary | ICD-10-CM | POA: Diagnosis not present

## 2018-06-04 LAB — COMPREHENSIVE METABOLIC PANEL
ALT: 15 U/L (ref 0–35)
AST: 17 U/L (ref 0–37)
Albumin: 4.5 g/dL (ref 3.5–5.2)
Alkaline Phosphatase: 113 U/L (ref 39–117)
BUN: 24 mg/dL — ABNORMAL HIGH (ref 6–23)
CO2: 32 mEq/L (ref 19–32)
Calcium: 9.4 mg/dL (ref 8.4–10.5)
Chloride: 105 mEq/L (ref 96–112)
Creatinine, Ser: 1.51 mg/dL — ABNORMAL HIGH (ref 0.40–1.20)
GFR: 36.95 mL/min — ABNORMAL LOW (ref >60.00)
Glucose, Bld: 94 mg/dL (ref 70–99)
Potassium: 4.4 mEq/L (ref 3.5–5.1)
Sodium: 143 mEq/L (ref 135–145)
Total Bilirubin: 0.4 mg/dL (ref 0.2–1.2)
Total Protein: 7.5 g/dL (ref 6.0–8.3)

## 2018-06-04 LAB — CBC WITH DIFFERENTIAL/PLATELET
Basophils Absolute: 0.1 10*3/uL (ref 0.0–0.1)
Basophils Relative: 0.7 % (ref 0.0–3.0)
Eosinophils Absolute: 0.2 10*3/uL (ref 0.0–0.7)
Eosinophils Relative: 2.5 % (ref 0.0–5.0)
HCT: 39.7 % (ref 36.0–46.0)
Hemoglobin: 13.1 g/dL (ref 12.0–15.0)
Lymphocytes Relative: 33.4 % (ref 12.0–46.0)
Lymphs Abs: 2.8 10*3/uL (ref 0.7–4.0)
MCHC: 33 g/dL (ref 30.0–36.0)
MCV: 95.8 fl (ref 78.0–100.0)
Monocytes Absolute: 0.6 10*3/uL (ref 0.1–1.0)
Monocytes Relative: 6.5 % (ref 3.0–12.0)
Neutro Abs: 4.8 10*3/uL (ref 1.4–7.7)
Neutrophils Relative %: 56.9 % (ref 43.0–77.0)
Platelets: 267 10*3/uL (ref 150.0–400.0)
RBC: 4.14 Mil/uL (ref 3.87–5.11)
RDW: 14.8 % (ref 11.5–15.5)
WBC: 8.5 10*3/uL (ref 4.0–10.5)

## 2018-06-04 LAB — LIPID PANEL
Cholesterol: 214 mg/dL — ABNORMAL HIGH (ref 0–200)
HDL: 38.3 mg/dL — ABNORMAL LOW (ref >39.00)
NonHDL: 175.63
Total CHOL/HDL Ratio: 6
Triglycerides: 392 mg/dL — ABNORMAL HIGH (ref 0.0–149.0)
VLDL: 78.4 mg/dL — ABNORMAL HIGH (ref 0.0–40.0)

## 2018-06-04 LAB — LDL CHOLESTEROL, DIRECT: Direct LDL: 120 mg/dL

## 2018-06-04 LAB — HEMOGLOBIN A1C: Hgb A1c MFr Bld: 6.4 % (ref 4.6–6.5)

## 2018-06-04 LAB — TSH: TSH: 3.18 u[IU]/mL (ref 0.35–4.50)

## 2018-06-04 MED ORDER — BLOOD GLUCOSE MONITOR KIT: PACK | 0 refills | Status: DC

## 2018-06-04 MED ORDER — HYDROCHLOROTHIAZIDE 12.5 MG PO TABS: 12.5000 mg | ORAL_TABLET | Freq: Every day | ORAL | 1 refills | Status: DC | PRN

## 2018-06-04 NOTE — Assessment & Plan Note (Addendum)
States she drinks water throughout the day Taking diclofenac daily-prescribed by pain management CMP

## 2018-06-04 NOTE — Patient Instructions (Addendum)
Spray at [place]  Dr Halford Chessman at [facility] [phone]    Tests ordered today. Your results will be released to [facility] (or called to you) after review, usually within 72hours after test completion. If any changes need to be made, you will be notified at that same time.  Flu immunization administered today.    Medications reviewed and updated.  Changes include :   none   Please followup in 6 months

## 2018-06-04 NOTE — Assessment & Plan Note (Signed)
BP Readings from Last 3 Encounters:  06/04/18 134/82  05/13/18 (!) 145/88  04/01/18 124/81    BP well controlled Current regimen effective and well tolerated Continue current medications at current doses cmp

## 2018-06-04 NOTE — Assessment & Plan Note (Addendum)
Check A1c Sugars diet controlled Encouraged to improving her diet-decreasing carbohydrates and sugars Follow-up in 6 months

## 2018-06-04 NOTE — Assessment & Plan Note (Signed)
GERD controlled Continue daily medication  

## 2018-06-04 NOTE — Assessment & Plan Note (Signed)
Chronically fatigued-unchanged Clinically euthyroid Check tsh  Titrate med dose if needed

## 2018-06-04 NOTE — Assessment & Plan Note (Signed)
She has mild, intermittent left ankle edema Takes hydrochlorothiazide as needed only-every 4-5 days Controlled Continue

## 2018-06-04 NOTE — Assessment & Plan Note (Addendum)
Has chronic shortness of breath, wheezing but no cough Using Spiriva Stressed smoking cessation-she states she has cut down She does have at times difficulty breathing which makes her more anxious-she did request Xanax, but I said I will not prescribe that she is already on clonazepam States she may follow-up with pulmonary, which is a good idea-she wants to know if her lungs have gotten worse Stressed the importance of smoking cessation

## 2018-06-05 ENCOUNTER — Telehealth: Payer: Self-pay

## 2018-06-05 ENCOUNTER — Other Ambulatory Visit: Payer: Self-pay | Admitting: Internal Medicine

## 2018-06-05 NOTE — Telephone Encounter (Signed)
Pt aware of recent lab results

## 2018-06-05 NOTE — Telephone Encounter (Signed)
LVM for pt to call back.

## 2018-06-05 NOTE — Telephone Encounter (Signed)
Copied from [facility] [id number]. Topic: General - Other >> Jun 05, 2018  8:30 AM Marin Olp L wrote: Reason for CRM: Patient would like a call back from cma to discuss current kidney condition, diabetes and lab results.

## 2018-06-06 ENCOUNTER — Other Ambulatory Visit: Payer: Self-pay | Admitting: Internal Medicine

## 2018-06-12 ENCOUNTER — Other Ambulatory Visit: Payer: Self-pay | Admitting: Registered Nurse

## 2018-06-12 ENCOUNTER — Other Ambulatory Visit: Payer: Self-pay | Admitting: Adult Health

## 2018-06-17 ENCOUNTER — Encounter: Payer: Medicare Other | Attending: Physical Medicine & Rehabilitation | Admitting: Registered Nurse

## 2018-06-17 ENCOUNTER — Encounter: Payer: Self-pay | Admitting: Registered Nurse

## 2018-06-17 ENCOUNTER — Other Ambulatory Visit (INDEPENDENT_AMBULATORY_CARE_PROVIDER_SITE_OTHER): Payer: Medicare Other

## 2018-06-17 VITALS — BP 156/89 | HR 85 | Resp 14 | Ht 63.0 in | Wt 154.0 lb

## 2018-06-17 DIAGNOSIS — M2170 Unequal limb length (acquired), unspecified site: Secondary | ICD-10-CM | POA: Insufficient documentation

## 2018-06-17 DIAGNOSIS — E039 Hypothyroidism, unspecified: Secondary | ICD-10-CM | POA: Insufficient documentation

## 2018-06-17 DIAGNOSIS — M5136 Other intervertebral disc degeneration, lumbar region: Secondary | ICD-10-CM

## 2018-06-17 DIAGNOSIS — M961 Postlaminectomy syndrome, not elsewhere classified: Secondary | ICD-10-CM | POA: Diagnosis not present

## 2018-06-17 DIAGNOSIS — M4316 Spondylolisthesis, lumbar region: Secondary | ICD-10-CM

## 2018-06-17 DIAGNOSIS — M62830 Muscle spasm of back: Secondary | ICD-10-CM | POA: Diagnosis not present

## 2018-06-17 DIAGNOSIS — I10 Essential (primary) hypertension: Secondary | ICD-10-CM | POA: Insufficient documentation

## 2018-06-17 DIAGNOSIS — M51369 Other intervertebral disc degeneration, lumbar region without mention of lumbar back pain or lower extremity pain: Secondary | ICD-10-CM

## 2018-06-17 DIAGNOSIS — Z79899 Other long term (current) drug therapy: Secondary | ICD-10-CM | POA: Insufficient documentation

## 2018-06-17 DIAGNOSIS — N189 Chronic kidney disease, unspecified: Secondary | ICD-10-CM | POA: Diagnosis not present

## 2018-06-17 DIAGNOSIS — G4709 Other insomnia: Secondary | ICD-10-CM

## 2018-06-17 DIAGNOSIS — E079 Disorder of thyroid, unspecified: Secondary | ICD-10-CM | POA: Insufficient documentation

## 2018-06-17 DIAGNOSIS — G2581 Restless legs syndrome: Secondary | ICD-10-CM

## 2018-06-17 DIAGNOSIS — M5126 Other intervertebral disc displacement, lumbar region: Secondary | ICD-10-CM | POA: Insufficient documentation

## 2018-06-17 DIAGNOSIS — G894 Chronic pain syndrome: Secondary | ICD-10-CM

## 2018-06-17 DIAGNOSIS — Z9889 Other specified postprocedural states: Secondary | ICD-10-CM | POA: Insufficient documentation

## 2018-06-17 DIAGNOSIS — E785 Hyperlipidemia, unspecified: Secondary | ICD-10-CM | POA: Insufficient documentation

## 2018-06-17 DIAGNOSIS — Z720 Tobacco use: Secondary | ICD-10-CM

## 2018-06-17 DIAGNOSIS — Z79891 Long term (current) use of opiate analgesic: Secondary | ICD-10-CM

## 2018-06-17 DIAGNOSIS — F411 Generalized anxiety disorder: Secondary | ICD-10-CM

## 2018-06-17 DIAGNOSIS — K219 Gastro-esophageal reflux disease without esophagitis: Secondary | ICD-10-CM | POA: Insufficient documentation

## 2018-06-17 DIAGNOSIS — M170 Bilateral primary osteoarthritis of knee: Secondary | ICD-10-CM | POA: Insufficient documentation

## 2018-06-17 DIAGNOSIS — Z9981 Dependence on supplemental oxygen: Secondary | ICD-10-CM | POA: Insufficient documentation

## 2018-06-17 DIAGNOSIS — Z5181 Encounter for therapeutic drug level monitoring: Secondary | ICD-10-CM

## 2018-06-17 DIAGNOSIS — M7552 Bursitis of left shoulder: Secondary | ICD-10-CM

## 2018-06-17 DIAGNOSIS — J449 Chronic obstructive pulmonary disease, unspecified: Secondary | ICD-10-CM | POA: Insufficient documentation

## 2018-06-17 DIAGNOSIS — G9619 Other disorders of meninges, not elsewhere classified: Secondary | ICD-10-CM | POA: Insufficient documentation

## 2018-06-17 LAB — BASIC METABOLIC PANEL
BUN: 15 mg/dL (ref 6–23)
CO2: 31 mEq/L (ref 19–32)
Calcium: 9.3 mg/dL (ref 8.4–10.5)
Chloride: 104 mEq/L (ref 96–112)
Creatinine, Ser: 1.31 mg/dL — ABNORMAL HIGH (ref 0.40–1.20)
GFR: 43.53 mL/min — ABNORMAL LOW (ref >60.00)
Glucose, Bld: 85 mg/dL (ref 70–99)
Potassium: 3.8 mEq/L (ref 3.5–5.1)
Sodium: 144 mEq/L (ref 135–145)

## 2018-06-17 MED ORDER — FENTANYL 50 MCG/HR TD PT72: 50.0000 ug | MEDICATED_PATCH | TRANSDERMAL | 0 refills | Status: DC

## 2018-06-17 MED ORDER — HYDROCODONE-ACETAMINOPHEN 10-325 MG PO TABS: 1.0000 | ORAL_TABLET | Freq: Four times a day (QID) | ORAL | 0 refills | Status: DC | PRN

## 2018-06-17 NOTE — Telephone Encounter (Signed)
Patient called in wanting to speak with Lovena Le about results that were given to her

## 2018-06-17 NOTE — Progress Notes (Signed)
Subjective:    Patient ID: Jill Phillips, female    DOB: Jan 17, 1956, 62 y.o.   MRN: [id number]  HPI: [name] is a 62 year old female who is her for follow up appointment for chronic pain and medication refill. She states her pain is located in her left shoulder and has increased in intensity over the last week denies falling, also lower back pain and left hip pain. She rates her pain 6. Her current exercise regime is walking.   [name] was upset about her blood work that was drawn via her PCP orders, her questions was answered. She is scheduled to repeat her blood work today she states.   [name] Morphine Equivalent is 160.00 MME. She is also prescribed Clonazepam. We have discussed the black box warning of using opioids and benzodiazepines. I highlighted the dangers of using these drugs together and discussed the adverse events including respiratory suppression, overdose, cognitive impairment and importance of compliance with current regimen. We will continue to monitor and adjust as indicated.   Last Oral Swab was Performed on 02/27/2018, it was consistent.    Pain Inventory Average Pain 7 Pain Right Now 6 My pain is stabbing, tingling and aching  In the last 24 hours, has pain interfered with the following? General activity no selection Relation with others no selection Enjoyment of life no selection What TIME of day is your pain at its worst? evening Sleep (in general) Poor  Pain is worse with: walking, bending, sitting, standing and some activites Pain improves with: medication Relief from Meds: 5  Mobility Do you have any goals in this area?  no  Function Do you have any goals in this area?  no  Neuro/Psych weakness numbness anxiety  Prior Studies Any changes since last visit?  no  Physicians involved in your care Any changes since last visit?  no   Family History  Problem Relation Age of Onset  . Kidney disease Mother   . Heart disease  Father   . Anuerysm Brother 29       brain  . Heart disease Brother   . Heart disease Sister 16       s/p CABG  . Hypertension Sister   . Colon cancer Neg Hx    Social History   Socioeconomic History  . Marital status: Divorced    Spouse name: n/a  . Number of children: 2  . Years of education: 12+  . Highest education level: Not on file  Occupational History  . Occupation: disability    Comment: back surgeries  Social Needs  . Financial resource strain: Not on file  . Food insecurity:    Worry: Not on file    Inability: Not on file  . Transportation needs:    Medical: Not on file    Non-medical: Not on file  Tobacco Use  . Smoking status: Current Every Day Smoker    Packs/day: 1.25    Years: 45.00    Pack years: 56.25    Types: Cigarettes  . Smokeless tobacco: Never Used  . Tobacco comment: form given 12/25/16  Substance and Sexual Activity  . Alcohol use: No    Alcohol/week: 0.0 standard drinks  . Drug use: No    Types: Marijuana    Comment: 01/31/2016 "none since ~ 1980"  . Sexual activity: Never    Partners: Male  Lifestyle  . Physical activity:    Days per week: Not on file  Minutes per session: Not on file  . Stress: Not on file  Relationships  . Social connections:    Talks on phone: Not on file    Gets together: Not on file    Attends religious service: Not on file    Active member of club or organization: Not on file    Attends meetings of clubs or organizations: Not on file    Relationship status: Not on file  Other Topics Concern  . Not on file  Social History Narrative   Lives alone.  One daughter is local, but is getting ready to move to [place], where her children live with their father.  The other daughter lives near [place], [place].   Past Surgical History:  Procedure Laterality Date  . APPENDECTOMY    . BACK SURGERY     18 back surgeries (2 thoracic & 16 lumbar) (01/31/2016)  . DILATION AND CURETTAGE OF UTERUS    . HAMMER TOE SURGERY     . IR RADIOLOGIST EVAL & MGMT  07/21/2017  . JOINT REPLACEMENT    . KNEE ARTHROSCOPY Right   . LAPAROSCOPIC CHOLECYSTECTOMY    . LUMBAR FUSION N/A 08/01/2015   Procedure: Right sided L1-2 and L2-3 transforaminal lumbar interbody fusion with cages, Extension of posterior fusion T12 to L3, Replaced pedicle screws bilaterally L1-L2 , Replaced left sided pedicle screws L-3. Instrumentation T12 to L3 using local bone graft, Vivigen allograft and cancellous chips;  Surgeon: [name], MD;  Location: [facility];  Service: Orthopedics;  Laterality: N/A;  . LUMBAR LAMINECTOMY/DECOMPRESSION MICRODISCECTOMY N/A 01/28/2014   Procedure: Minimally Invasive Right  L1-2 Microdiscectomy;  Surgeon: [name], MD;  Location: [facility];  Service: Orthopedics;  Laterality: N/A;  . TOTAL HIP ARTHROPLASTY Right   . TOTAL KNEE ARTHROPLASTY  05/29/2012   Procedure: TOTAL KNEE ARTHROPLASTY;  Surgeon: Mcarthur Rossetti, MD;  Location: [facility];  Service: Orthopedics;  Laterality: Right;  Right Total Knee Arthroplasty  . TUBAL LIGATION    . UVULOPALATOPHARYNGOPLASTY    . VAGINAL HYSTERECTOMY     Past Medical History:  Diagnosis Date  . Active smoker   . Anxiety   . Calcifying tendinitis of shoulder   . Chronic back pain   . Chronic pain syndrome   . COPD (chronic obstructive pulmonary disease) ([place])   . Dysthymic disorder   . Emphysema lung ([place])   . GERD (gastroesophageal reflux disease)   . Headache    "weekly maybe" (01/31/2016)  . Heart murmur    for years, nothing to be concerned about  . Herpes genitalia   . History of blood transfusion 1980   related to "back surgery"  . Hyperlipidemia   . Hypertension   . Hypothyroidism   . Lumbago   . Osteoarthrosis, unspecified whether generalized or localized, lower leg   . Pain in joint, upper arm   . Pneumothorax, left 01/31/2016   S/P Left posterior subcostal pain injection on 01/30/2016  . PONV (postoperative nausea and vomiting)    gets nauseous  with  longer surgery. Difficuty voiding after surgery  . Postlaminectomy syndrome, thoracic region   . Primary localized osteoarthrosis, lower leg   . Restless leg syndrome   . Sleep apnea    s/p surgery- last sleep study 2011- doesnt use oxygen or machine at night as instructed,.   12/2014- Dr Halford Chessman  reports it is negative.  . Thyroid disease    BP (!) 156/89   Pulse 85   Resp 14  Ht 5\' 3"  (1.6 m)   Wt 154 lb (69.9 kg)   SpO2 93%   BMI 27.28 kg/m   Opioid Risk Score:   Fall Risk Score:  `1  Depression screen PHQ 2/9  Depression screen [facility] 2/9 01/01/2018 12/01/2017 10/10/2017 09/10/2017 06/13/2017 05/15/2016 05/02/2016  Decreased Interest 0 0 1 1 0 0 0  Down, Depressed, Hopeless 0 0 1 1 0 0 0  PHQ - 2 Score 0 0 2 2 0 0 0  Some recent data might be hidden    Review of Systems  Constitutional: Negative.   HENT: Negative.   Eyes: Negative.   Respiratory: Negative.   Gastrointestinal: Negative.   Endocrine: Negative.   Musculoskeletal: Positive for arthralgias.  Skin: Negative.   Allergic/Immunologic: Negative.   Neurological: Positive for weakness and numbness.  Psychiatric/Behavioral: The patient is nervous/anxious.   All other systems reviewed and are negative.      Objective:   Physical Exam  Constitutional: She is oriented to person, place, and time. She appears well-developed and well-nourished.  HENT:  Head: Normocephalic and atraumatic.  Neck: Normal range of motion. Neck supple.  Cardiovascular: Normal rate and regular rhythm.  Pulmonary/Chest: Effort normal and breath sounds normal.  Musculoskeletal:  Normal Muscle Bulk and Muscle Testing Reveals: Upper Extremities: Full ROM and Muscle Strength 5/5 Left AC Joint Tenderness Lumbar Paraspinal Tenderness L-3-L-5 Left Greater Trochanter Tenderness Lower Extremities: Full ROM and Muscle Strength 5/5 Arises from Table with Ease Narrow Based gait  Neurological: She is alert and oriented to person, place, and time.  Skin:  Skin is warm and dry.  Psychiatric: She has a normal mood and affect. Her behavior is normal.  Nursing note and vitals reviewed.         Assessment & Plan:  1. Lumbar Degenerative Disc/Chronic lumbar spine pain/post-lami syndrome: 06/17/2018 Refilled:Fentanyl 50 MCG one patch every three days #10 andHydrocodone10/325 mg one tablet every 6 hours as needed for pain #120.We will continue the opioid monitoring program, this consists of regular clinic visits, examinations, urine drug screen, pill counts as well as use of [place] Controlled Substance Reporting System. 2. Chronic Thoracic Pain:No complaints Today.Continue current medication regime and Continue to Monitor.06/17/18 3. Osteoarthritis of left knee:Continue with Heat, exercise and voltaren.06/17/2018 4. Rotator cuff syndrome/ Left subacromial bursitis:  Scheduled for cortisone injection with Dr. Naaman Plummer Continue to Monitor. 06/17/2018. 5. Restless legs syndrome:Continue with current treatment withRequip.Continue to monitor. 06/17/2018 6. Tobacco Abuse:Encourage and Educated onSmoking Cessation: PCP prescribed Nicotine Patches : 06/17/2018 7. Muscle Spasm:Continue current treatment Regimen withTizanidine. 06/17/2018 8. Insomnia:Continue current treatment regimen withSeroquel. 06/17/2018 9. Neuropathic Pain:Continue current treatment regimen withGabapentin.06/17/2018 10.Anxiety:Continue current treatment regimen withKlonopin. 05/13/2018 11 Chronic Rectal Pain: No complaints today.Continue Pelvic Floor Therapy.Dr. [name] Following.06/17/2018  20 minutes of face to face patient care time was spent during this visit. All questions were encouraged and answered.  F/u in 1 month

## 2018-06-17 NOTE — Telephone Encounter (Signed)
Called pt back. She is planning on coming back today to get kidney function checked. I told her we will let her know as soon as we get results back. Pt is very concerned about levels.

## 2018-06-18 ENCOUNTER — Telehealth: Payer: Self-pay | Admitting: *Deleted

## 2018-06-18 ENCOUNTER — Other Ambulatory Visit: Payer: Self-pay | Admitting: Internal Medicine

## 2018-06-18 ENCOUNTER — Other Ambulatory Visit: Payer: Self-pay | Admitting: Physical Medicine & Rehabilitation

## 2018-06-18 ENCOUNTER — Encounter: Payer: Self-pay | Admitting: Internal Medicine

## 2018-06-18 NOTE — Telephone Encounter (Signed)
Patient left a voicemail stating that Jill Phillips recently sent in Rx for her pain meds with a refill date of 10/20. She reminds that this is Sunday and the pharmacy is closed. She is asking if we can call the pharmacy and grant permission for early refill on Friday so it can be home delivery, a service they do not provide on Saturday. Also, provider Billey Gosling MD has taken her off the anti inflammatory that we prescribe. FYI

## 2018-06-19 NOTE — Telephone Encounter (Signed)
Placed a call to [facility], Jill Phillips Medication was filled on 05/25/2018, she may have her prescriptions delivered on 06/22/2018. Placed a call to Jill Phillips regarding the above she verbalizes understanding.

## 2018-06-22 ENCOUNTER — Encounter: Payer: Self-pay | Admitting: Physical Medicine & Rehabilitation

## 2018-06-22 ENCOUNTER — Encounter (HOSPITAL_BASED_OUTPATIENT_CLINIC_OR_DEPARTMENT_OTHER): Payer: Medicare Other | Admitting: Physical Medicine & Rehabilitation

## 2018-06-22 ENCOUNTER — Other Ambulatory Visit: Payer: Self-pay

## 2018-06-22 VITALS — BP 115/76 | HR 81 | Ht 63.0 in | Wt 155.8 lb

## 2018-06-22 DIAGNOSIS — G894 Chronic pain syndrome: Secondary | ICD-10-CM | POA: Diagnosis not present

## 2018-06-22 DIAGNOSIS — G2581 Restless legs syndrome: Secondary | ICD-10-CM | POA: Diagnosis not present

## 2018-06-22 DIAGNOSIS — Z79899 Other long term (current) drug therapy: Secondary | ICD-10-CM | POA: Diagnosis not present

## 2018-06-22 DIAGNOSIS — Z720 Tobacco use: Secondary | ICD-10-CM | POA: Diagnosis not present

## 2018-06-22 DIAGNOSIS — E039 Hypothyroidism, unspecified: Secondary | ICD-10-CM | POA: Diagnosis not present

## 2018-06-22 DIAGNOSIS — M961 Postlaminectomy syndrome, not elsewhere classified: Secondary | ICD-10-CM | POA: Diagnosis not present

## 2018-06-22 DIAGNOSIS — M75102 Unspecified rotator cuff tear or rupture of left shoulder, not specified as traumatic: Secondary | ICD-10-CM | POA: Diagnosis not present

## 2018-06-22 DIAGNOSIS — E785 Hyperlipidemia, unspecified: Secondary | ICD-10-CM | POA: Diagnosis not present

## 2018-06-22 DIAGNOSIS — Z9889 Other specified postprocedural states: Secondary | ICD-10-CM | POA: Diagnosis not present

## 2018-06-22 DIAGNOSIS — Z9981 Dependence on supplemental oxygen: Secondary | ICD-10-CM | POA: Diagnosis not present

## 2018-06-22 DIAGNOSIS — IMO0002 Reserved for concepts with insufficient information to code with codable children: Secondary | ICD-10-CM | POA: Insufficient documentation

## 2018-06-22 DIAGNOSIS — J449 Chronic obstructive pulmonary disease, unspecified: Secondary | ICD-10-CM | POA: Diagnosis not present

## 2018-06-22 DIAGNOSIS — M2170 Unequal limb length (acquired), unspecified site: Secondary | ICD-10-CM | POA: Diagnosis not present

## 2018-06-22 DIAGNOSIS — G9619 Other disorders of meninges, not elsewhere classified: Secondary | ICD-10-CM | POA: Diagnosis not present

## 2018-06-22 DIAGNOSIS — M170 Bilateral primary osteoarthritis of knee: Secondary | ICD-10-CM | POA: Diagnosis not present

## 2018-06-22 DIAGNOSIS — I10 Essential (primary) hypertension: Secondary | ICD-10-CM | POA: Diagnosis not present

## 2018-06-22 DIAGNOSIS — Z5181 Encounter for therapeutic drug level monitoring: Secondary | ICD-10-CM | POA: Diagnosis not present

## 2018-06-22 DIAGNOSIS — E079 Disorder of thyroid, unspecified: Secondary | ICD-10-CM | POA: Diagnosis not present

## 2018-06-22 DIAGNOSIS — M5126 Other intervertebral disc displacement, lumbar region: Secondary | ICD-10-CM | POA: Diagnosis not present

## 2018-06-22 DIAGNOSIS — K219 Gastro-esophageal reflux disease without esophagitis: Secondary | ICD-10-CM | POA: Diagnosis not present

## 2018-06-22 MED ORDER — HYDROCODONE-ACETAMINOPHEN 10-325 MG PO TABS: 1.0000 | ORAL_TABLET | Freq: Four times a day (QID) | ORAL | 0 refills | Status: DC | PRN

## 2018-06-22 MED ORDER — DICLOFENAC SODIUM 1 % TD GEL: 2.0000 g | Freq: Four times a day (QID) | TRANSDERMAL | 4 refills | Status: DC

## 2018-06-22 NOTE — Patient Instructions (Signed)
PLEASE FEEL FREE TO CALL OUR OFFICE WITH ANY PROBLEMS OR QUESTIONS ([phone])      

## 2018-06-22 NOTE — Progress Notes (Signed)
PROCEDURE NOTE  DIAGNOSIS: left rotator cuff syndrome  INTERVENTION:  Major joint injection     After informed consent and preparation of the skin with betadine and isopropyl alcohol, I injected 6mg  (1cc) of celestone and 4cc of 1% lidocaine into the left shoulder via lateral approach. Additionally, aspiration was performed prior to injection. The patient tolerated well, and no complications were encountered. Afterward the area was cleaned and dressed. Post- injection instructions were provided.    Refilled hydrocodone for November. Follow up in 6 weeks  Meredith Staggers, MD, [facility] 06/22/2018

## 2018-06-26 ENCOUNTER — Telehealth: Payer: Self-pay

## 2018-06-26 DIAGNOSIS — M25512 Pain in left shoulder: Secondary | ICD-10-CM

## 2018-06-26 NOTE — Telephone Encounter (Signed)
Left message for Jill Phillips.

## 2018-06-26 NOTE — Telephone Encounter (Signed)
MRI ordered

## 2018-06-26 NOTE — Telephone Encounter (Signed)
Pt called stating she recently had a shoulder injection(Monday 10-21) and her whole arm is hurting, mostly from elbow down. Requesting CT or MRI.

## 2018-07-04 ENCOUNTER — Other Ambulatory Visit: Payer: Medicare Other

## 2018-07-07 ENCOUNTER — Telehealth: Payer: Self-pay | Admitting: *Deleted

## 2018-07-07 NOTE — Telephone Encounter (Signed)
Jill Phillips called in tears complaining of her arm pain.She says it is not just in her shoulder but her entire are is hurting just like when she had the ruptured disk in her back how her leg hurt.  She is unable to do anything except keep arm by her side, She is asking if Dr Naaman Plummer will allow her to take 1/2 more of her pain pill (?1 1/2 tab) Please advise.

## 2018-07-08 NOTE — Telephone Encounter (Signed)
Spoke with Jill Phillips about some options. Her MRI is  Scheduled for next week. She may use an extra "1/2" of her hydrocodone at night if she uses a "1/2" less for one of her doses during the day.

## 2018-07-08 NOTE — Telephone Encounter (Signed)
I have put an appt note concerning this message so that Zella Ball will know at her next appointment on 08/03/18.

## 2018-07-09 ENCOUNTER — Ambulatory Visit (INDEPENDENT_AMBULATORY_CARE_PROVIDER_SITE_OTHER): Payer: Medicare Other | Admitting: Specialist

## 2018-07-15 ENCOUNTER — Telehealth: Payer: Self-pay

## 2018-07-15 ENCOUNTER — Other Ambulatory Visit: Payer: Medicare Other

## 2018-07-15 ENCOUNTER — Ambulatory Visit: Payer: Medicare Other | Admitting: Physical Medicine & Rehabilitation

## 2018-07-15 NOTE — Telephone Encounter (Signed)
Patient called today, stated is uncertain wither or not to go ahead and receive the CT scan of her shoulder due to pain being on the neck as well.  Consulted with Dr. Naaman Plummer, he stated to go ahead and receive the CT and if there are no clear indications on the shoulder then he would probably scan the neck after the results from the shoulder came in.  Called patient back and relayed information to her, she stated understands and will go ahead with the CT scan

## 2018-07-21 ENCOUNTER — Other Ambulatory Visit: Payer: Self-pay | Admitting: Physical Medicine & Rehabilitation

## 2018-07-21 ENCOUNTER — Telehealth: Payer: Self-pay | Admitting: *Deleted

## 2018-07-21 ENCOUNTER — Other Ambulatory Visit: Payer: Self-pay | Admitting: Registered Nurse

## 2018-07-21 DIAGNOSIS — M4316 Spondylolisthesis, lumbar region: Secondary | ICD-10-CM

## 2018-07-21 DIAGNOSIS — M5136 Other intervertebral disc degeneration, lumbar region: Secondary | ICD-10-CM

## 2018-07-21 DIAGNOSIS — M51369 Other intervertebral disc degeneration, lumbar region without mention of lumbar back pain or lower extremity pain: Secondary | ICD-10-CM

## 2018-07-21 MED ORDER — FENTANYL 50 MCG/HR TD PT72: 50.0000 ug | MEDICATED_PATCH | TRANSDERMAL | 0 refills | Status: DC

## 2018-07-21 NOTE — Telephone Encounter (Signed)
I contacted the patient per [facility] request.  Patient states that Dr. Naaman Plummer told her 'called in' the fentanyl. That is not evident in the chart. I tried to reschedule her to come in.  She states she cannot arrange a ride that quick.  Her insurance will pay for uber, however, they require more advance notice that the time allotted. She is currently wearing her last patch.  It needs to be replaced Thursday November 21st. I have forwarded this phone message to [place].  An electronic refill request is posted in Epic.Marland KitchenMarland KitchenMarland KitchenPlease advise

## 2018-07-21 NOTE — Telephone Encounter (Signed)
Spoke to [place], refill request sent to [facility]. Texted Dr. Naaman Plummer.  He says he will take care of it.  Contacted patient

## 2018-07-24 ENCOUNTER — Telehealth: Payer: Self-pay | Admitting: Internal Medicine

## 2018-07-24 NOTE — Telephone Encounter (Signed)
Spoke with patient regarding AWV. Patient would like a call at a later time to schedule wellness visit. SF

## 2018-07-28 ENCOUNTER — Ambulatory Visit
Admission: RE | Admit: 2018-07-28 | Discharge: 2018-07-28 | Disposition: A | Discharge status: Home / Self Care | Payer: Medicare Other | Source: Ambulatory Visit | Attending: Physical Medicine & Rehabilitation | Admitting: Physical Medicine & Rehabilitation

## 2018-07-28 DIAGNOSIS — M25512 Pain in left shoulder: Secondary | ICD-10-CM

## 2018-07-28 DIAGNOSIS — M75122 Complete rotator cuff tear or rupture of left shoulder, not specified as traumatic: Secondary | ICD-10-CM | POA: Diagnosis not present

## 2018-07-29 ENCOUNTER — Telehealth: Payer: Self-pay | Admitting: *Deleted

## 2018-07-29 NOTE — Telephone Encounter (Signed)
Jill Phillips has called and wants to know the results of her shoulder MRI.  I have left a message informing her it will be Monday before Dr Eda Keys will be able to address.

## 2018-08-03 ENCOUNTER — Encounter: Payer: Self-pay | Admitting: Registered Nurse

## 2018-08-03 ENCOUNTER — Encounter: Payer: Medicare Other | Attending: Registered Nurse | Admitting: Registered Nurse

## 2018-08-03 VITALS — BP 101/68 | HR 88 | Ht 63.0 in | Wt 153.0 lb

## 2018-08-03 DIAGNOSIS — G2581 Restless legs syndrome: Secondary | ICD-10-CM | POA: Diagnosis not present

## 2018-08-03 DIAGNOSIS — G629 Polyneuropathy, unspecified: Secondary | ICD-10-CM | POA: Diagnosis not present

## 2018-08-03 DIAGNOSIS — G894 Chronic pain syndrome: Secondary | ICD-10-CM

## 2018-08-03 DIAGNOSIS — G8929 Other chronic pain: Secondary | ICD-10-CM | POA: Diagnosis not present

## 2018-08-03 DIAGNOSIS — F411 Generalized anxiety disorder: Secondary | ICD-10-CM

## 2018-08-03 DIAGNOSIS — F17210 Nicotine dependence, cigarettes, uncomplicated: Secondary | ICD-10-CM | POA: Diagnosis not present

## 2018-08-03 DIAGNOSIS — M5136 Other intervertebral disc degeneration, lumbar region: Secondary | ICD-10-CM | POA: Insufficient documentation

## 2018-08-03 DIAGNOSIS — M961 Postlaminectomy syndrome, not elsewhere classified: Secondary | ICD-10-CM | POA: Insufficient documentation

## 2018-08-03 DIAGNOSIS — M75102 Unspecified rotator cuff tear or rupture of left shoulder, not specified as traumatic: Secondary | ICD-10-CM | POA: Diagnosis not present

## 2018-08-03 DIAGNOSIS — G4700 Insomnia, unspecified: Secondary | ICD-10-CM | POA: Insufficient documentation

## 2018-08-03 DIAGNOSIS — M4316 Spondylolisthesis, lumbar region: Secondary | ICD-10-CM

## 2018-08-03 DIAGNOSIS — F419 Anxiety disorder, unspecified: Secondary | ICD-10-CM | POA: Insufficient documentation

## 2018-08-03 DIAGNOSIS — Z79891 Long term (current) use of opiate analgesic: Secondary | ICD-10-CM

## 2018-08-03 DIAGNOSIS — Z5181 Encounter for therapeutic drug level monitoring: Secondary | ICD-10-CM

## 2018-08-03 DIAGNOSIS — Z720 Tobacco use: Secondary | ICD-10-CM

## 2018-08-03 DIAGNOSIS — M25512 Pain in left shoulder: Secondary | ICD-10-CM | POA: Diagnosis not present

## 2018-08-03 DIAGNOSIS — IMO0002 Reserved for concepts with insufficient information to code with codable children: Secondary | ICD-10-CM

## 2018-08-03 DIAGNOSIS — G4709 Other insomnia: Secondary | ICD-10-CM

## 2018-08-03 DIAGNOSIS — M62830 Muscle spasm of back: Secondary | ICD-10-CM

## 2018-08-03 NOTE — Telephone Encounter (Signed)
Spoke with patient today. She sees Zella Ball this morning also. Needs orthopedic surgery eval. Will reach out to Dr. [name] who knows her re: follow up.

## 2018-08-03 NOTE — Progress Notes (Signed)
Subjective:    Patient ID: Jill Phillips, female    DOB: Apr 16, 1956, 62 y.o.   MRN: [id number]  HPI: Jill Phillips is a 62 y.o. female who returns for follow up appointment for chronic pain and medication refill. She states her pain is located in her left shoulder, right arm with tingling and burning sensation and mid to lower back pain. She denies any chest pain or SOB. She rates her pain 9. Her current exercise regime is walking.   Dr. Naaman Plummer reviewed MRI with Jill Phillips and left a message with Dr. [name], awaiting Dr. [name] input, she verbalizes understanding. Marland KitchenNo changes will be made to analgesics today, Jill. Phillips has a scheduled appointment with Dr. [name] PA, she verbalizes understanding. She was instructed to call office after her appointment, she verbalizes understanding.   Left Shoulder MRI Results: IMPRESSION: Complete supraspinatus tendon tear with 3.5-4.5 cm of retraction. Partial tear of the superior subscapularis measures approximately 0.5 cm craniocaudal with 1-2 cm of retraction. No atrophy.  Complete tear of the long head of biceps from the superior labrum.  Bulky acromioclavicular osteoarthritis. Subacromial spurring also noted.   Jill Meas Morphine equivalent is 160.00  MME. Sheis also prescribed Clonazepam. We have discussed the black box warning of using opioids and benzodiazepines. I highlighted the dangers of using these drugs together and discussed the adverse events including respiratory suppression, overdose, cognitive impairment and importance of compliance with current regimen. We will continue to monitor and adjust as indicated.   Last Oral Swab was Performed on 02/27/2018, it was consistent.   Pain Inventory Average Pain 9 Pain Right Now 9 My pain is .  In the last 24 hours, has pain interfered with the following? General activity 10 Relation with others 10 Enjoyment of life 10 What TIME of day is your pain at its worst? daytime Sleep (in general)  Fair  Pain is worse with: inactivity and some activites Pain improves with: medication Relief from Meds: 5  Mobility Do you have any goals in this area?  no  Function Do you have any goals in this area?  no  Neuro/Psych No problems in this area  Prior Studies Any changes since last visit?  no  Physicians involved in your care Any changes since last visit?  no   Family History  Problem Relation Age of Onset  . Kidney disease Mother   . Heart disease Father   . Anuerysm Brother 29       brain  . Heart disease Brother   . Heart disease Sister 69       s/p CABG  . Hypertension Sister   . Colon cancer Neg Hx    Social History   Socioeconomic History  . Marital status: Divorced    Spouse name: n/a  . Number of children: 2  . Years of education: 12+  . Highest education level: Not on file  Occupational History  . Occupation: disability    Comment: back surgeries  Social Needs  . Financial resource strain: Not on file  . Food insecurity:    Worry: Not on file    Inability: Not on file  . Transportation needs:    Medical: Not on file    Non-medical: Not on file  Tobacco Use  . Smoking status: Current Every Day Smoker    Packs/day: 1.25    Years: 45.00    Pack years: 56.25    Types: Cigarettes  . Smokeless tobacco: Never Used  .  Tobacco comment: form given 12/25/16  Substance and Sexual Activity  . Alcohol use: No    Alcohol/week: 0.0 standard drinks  . Drug use: No    Types: Marijuana    Comment: 01/31/2016 "none since ~ 1980"  . Sexual activity: Never    Partners: Male  Lifestyle  . Physical activity:    Days per week: Not on file    Minutes per session: Not on file  . Stress: Not on file  Relationships  . Social connections:    Talks on phone: Not on file    Gets together: Not on file    Attends religious service: Not on file    Active member of club or organization: Not on file    Attends meetings of clubs or organizations: Not on file     Relationship status: Not on file  Other Topics Concern  . Not on file  Social History Narrative   Lives alone.  One daughter is local, but is getting ready to move to [place], where her children live with their father.  The other daughter lives near [place], [place].   Past Surgical History:  Procedure Laterality Date  . APPENDECTOMY    . BACK SURGERY     18 back surgeries (2 thoracic & 16 lumbar) (01/31/2016)  . DILATION AND CURETTAGE OF UTERUS    . HAMMER TOE SURGERY    . IR RADIOLOGIST EVAL & MGMT  07/21/2017  . JOINT REPLACEMENT    . KNEE ARTHROSCOPY Right   . LAPAROSCOPIC CHOLECYSTECTOMY    . LUMBAR FUSION N/A 08/01/2015   Procedure: Right sided L1-2 and L2-3 transforaminal lumbar interbody fusion with cages, Extension of posterior fusion T12 to L3, Replaced pedicle screws bilaterally L1-L2 , Replaced left sided pedicle screws L-3. Instrumentation T12 to L3 using local bone graft, Vivigen allograft and cancellous chips;  Surgeon: [name], MD;  Location: [facility];  Service: Orthopedics;  Laterality: N/A;  . LUMBAR LAMINECTOMY/DECOMPRESSION MICRODISCECTOMY N/A 01/28/2014   Procedure: Minimally Invasive Right  L1-2 Microdiscectomy;  Surgeon: [name], MD;  Location: [facility];  Service: Orthopedics;  Laterality: N/A;  . TOTAL HIP ARTHROPLASTY Right   . TOTAL KNEE ARTHROPLASTY  05/29/2012   Procedure: TOTAL KNEE ARTHROPLASTY;  Surgeon: Mcarthur Rossetti, MD;  Location: [facility];  Service: Orthopedics;  Laterality: Right;  Right Total Knee Arthroplasty  . TUBAL LIGATION    . UVULOPALATOPHARYNGOPLASTY    . VAGINAL HYSTERECTOMY     Past Medical History:  Diagnosis Date  . Active smoker   . Anxiety   . Calcifying tendinitis of shoulder   . Chronic back pain   . Chronic pain syndrome   . COPD (chronic obstructive pulmonary disease) ([place])   . Dysthymic disorder   . Emphysema lung ([place])   . GERD (gastroesophageal reflux disease)   . Headache    "weekly maybe" (01/31/2016)  . Heart  murmur    for years, nothing to be concerned about  . Herpes genitalia   . History of blood transfusion 1980   related to "back surgery"  . Hyperlipidemia   . Hypertension   . Hypothyroidism   . Lumbago   . Osteoarthrosis, unspecified whether generalized or localized, lower leg   . Pain in joint, upper arm   . Pneumothorax, left 01/31/2016   S/P Left posterior subcostal pain injection on 01/30/2016  . PONV (postoperative nausea and vomiting)    gets nauseous  with longer surgery. Difficuty voiding after surgery  .  Postlaminectomy syndrome, thoracic region   . Primary localized osteoarthrosis, lower leg   . Restless leg syndrome   . Sleep apnea    s/p surgery- last sleep study 2011- doesnt use oxygen or machine at night as instructed,.   12/2014- Dr Halford Chessman  reports it is negative.  . Thyroid disease    BP 101/68   Pulse 88   Ht 5\' 3"  (1.6 m)   Wt 153 lb (69.4 kg)   SpO2 91%   BMI 27.10 kg/m   Opioid Risk Score:   Fall Risk Score:  `1  Depression screen PHQ 2/9  Depression screen [facility] 2/9 06/22/2018 01/01/2018 12/01/2017 10/10/2017 09/10/2017 06/13/2017 05/15/2016  Decreased Interest 0 0 0 1 1 0 0  Down, Depressed, Hopeless 0 0 0 1 1 0 0  PHQ - 2 Score 0 0 0 2 2 0 0  Some recent data might be hidden    Review of Systems  Constitutional: Negative.   HENT: Negative.   Eyes: Negative.   Respiratory: Negative.   Cardiovascular: Negative.   Gastrointestinal: Negative.   Endocrine: Negative.   Genitourinary: Negative.   Musculoskeletal: Positive for arthralgias, joint swelling and myalgias.  Skin: Negative.   Allergic/Immunologic: Negative.   Neurological: Negative.   Hematological: Negative.   Psychiatric/Behavioral: Negative.   All other systems reviewed and are negative.      Objective:   Physical Exam  Constitutional: She is oriented to person, place, and time. She appears well-developed and well-nourished.  HENT:  Head: Normocephalic and atraumatic.  Neck: Normal range  of motion. Neck supple.  Cardiovascular: Normal rate and regular rhythm.  Pulmonary/Chest: Effort normal and breath sounds normal.  Musculoskeletal:  Normal Muscle Bulk and Muscle Testing Reveals: Upper Extremities: Right: Full ROM and Muscle Strength 5/5 Left: Decreased ROM 45 Degrees and Muscle Strength 4/5 Left AC Joint Tenderness Thoracic Paraspinal Tenderness: T-10- T-12 Lumbar Paraspinal Tenderness: L-4-L-5 Lower Extremities: Full ROM and Muscle Strength 5/5 Arises from chair with ease Narrow Based Gait   Neurological: She is alert and oriented to person, place, and time.  Skin: Skin is warm and dry.  Psychiatric: She has a normal mood and affect. Her behavior is normal.  Nursing note and vitals reviewed.         Assessment & Plan:  1. 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 2. Chronic Thoracic Pain:No complaints Today.Continue current medication regime and Continue to Monitor.08/03/18 3. Osteoarthritis of left knee:Continue with Heat, exercise and voltaren.08/03/2018 4. Rotator cuff syndrome/ Left subacromial bursitis:  S/P cortisone injection with no relief noted. Dr. Naaman Plummer spoke with Jill. [place] regarding her MRI results. Awaiting Dr. Flavia Shipper Input. She verbalizes understanding.  5. Restless legs syndrome:Continue with current treatment withRequip.Continue to monitor. 08/03/2018 6. Tobacco Abuse:Encourage and Educated onSmoking Cessation: PCP prescribed Nicotine Patches : 08/03/2018 7. Muscle Spasm:Continue current treatment Regimen withTizanidine. 08/03/2018 8. Insomnia:Continue current  treatment regimen withSeroquel. 08/03/2018 9. Neuropathic Pain:Continue current treatment regimen withGabapentin.08/03/2018 10.Anxiety:Continue current treatment regimen withKlonopin. 08/03/2018 11 Chronic Rectal Pain: No complaints today.Continue Pelvic Floor Therapy.Dr. [name] Following.08/03/2018  20 minutes of face to face patient care time was spent during this visit. All questions were encouraged and answered.  F/u in  1 month

## 2018-08-04 ENCOUNTER — Telehealth: Payer: Self-pay

## 2018-08-04 NOTE — Telephone Encounter (Signed)
Pt called stating her appt with Dr. [name] moved to Thursday.

## 2018-08-04 NOTE — Telephone Encounter (Signed)
Return [name] call, Her appointment  is scheduled for 08/06/2018, she will call office with the plan. This provider spoke with Dr Jill Phillips this morning regarding her analgesics and [name] increase intensity of pain. Her medications will be changed to MS Contin and MSIR, since she is allergic to Oxycodone. This was discussed with [name]. She will call office no later than 08/17/2018, she verbalizes understanding. Her medications was filled on 07/21/2018.

## 2018-08-05 ENCOUNTER — Telehealth: Payer: Self-pay

## 2018-08-05 NOTE — Telephone Encounter (Signed)
Patient called, states has been having uncontrollable pain and meds are not currently working for him.  Asking for what do you suggest she does for this pain.

## 2018-08-05 NOTE — Telephone Encounter (Signed)
Called and spoke with Jill Phillips and repeated plan to her.  Patient verbalized understanding.

## 2018-08-05 NOTE — Telephone Encounter (Signed)
This was specifically addressed with pt yesterday. See note.

## 2018-08-06 ENCOUNTER — Telehealth: Payer: Self-pay

## 2018-08-06 ENCOUNTER — Ambulatory Visit (INDEPENDENT_AMBULATORY_CARE_PROVIDER_SITE_OTHER): Payer: Medicare Other | Admitting: Surgery

## 2018-08-06 DIAGNOSIS — M5136 Other intervertebral disc degeneration, lumbar region: Secondary | ICD-10-CM

## 2018-08-06 DIAGNOSIS — M4316 Spondylolisthesis, lumbar region: Secondary | ICD-10-CM

## 2018-08-06 MED ORDER — FENTANYL 50 MCG/HR TD PT72: 50.0000 ug | MEDICATED_PATCH | TRANSDERMAL | 0 refills | Status: DC

## 2018-08-06 MED ORDER — OXYCODONE-ACETAMINOPHEN 10-325 MG PO TABS: 1.0000 | ORAL_TABLET | Freq: Four times a day (QID) | ORAL | 0 refills | Status: DC | PRN

## 2018-08-06 NOTE — Telephone Encounter (Signed)
Placed a call to [name], she didn't keep her appointment today with the PA. She is scheduled to see Dr. [name] on 08/20/2018. Due to her increase intensity of pain her Hydrocodone will be discontinue and she will be prescribed Oxycodone. Allergy list reviewed and discussed with [name] in detail, she will take benadryl 30 minutes prior to Oxycodone she verbalizes understanding.

## 2018-08-06 NOTE — Telephone Encounter (Signed)
Patient called today before noon, stated was unsure about going to appointment for ortho as it was not the ortho doctor she was seeing but rather his PA and is unsure if he is able to do anything with her medication.  She stated would rather be placed on percocet then morphine but is afraid of the withdrawal symptoms.  Stated is also not feeling well today.  Asked for a call back before the appointment at 2pm but was unable to respond to message until 4pm.  Please advise.

## 2018-08-07 ENCOUNTER — Telehealth: Payer: Self-pay | Admitting: *Deleted

## 2018-08-07 NOTE — Telephone Encounter (Signed)
Jill Phillips called at 12:46 to tellus she needs a prior auth or cant get her percocet, AND that she needs it by 1:00 or else the rx willhave to be sent to [facility] because her pharmacy has a cut off of 1:00 and will not fill after that time.

## 2018-08-07 NOTE — Telephone Encounter (Signed)
This provider placed a call to [name] for clarification on her Oxycodone request, she reports she needs a PA for Oxycodone. [name] wanted her prescription changed to [facility], explained to [name] switching the prescription to another pharmacy was not the answer, the office staff will have to submit a  PA, she verbalizes understanding.  The PMP Aware Web-site was reviewed. [name] was filled on 07/21/2018, she kept stating " she will be out of her Phillips medication by the weekend, I explained to [name] her Phillips was filled on 11/19.19 she should have 12 days of Phillips left which would be  ( 48 tablets). She reports she has 4 tablets of Phillips, This provider spoke to [name] yesterday and she never reported she was taking more medication than prescribed, educated on self prescribing and this can lead to discharge, she verbalizes understanding. Also reported today she has taken it upon herself to take more of her Phillips medication, she never called the office prior to the above. She can't recall how many days she has taken the extra Phillips. She began's crying. Oxycodone was discontinued per her request and due to her self prescribing Phillips won't be filled early, she verbalizes understanding.  This note will be routed to Dr. Naaman Plummer.

## 2018-08-10 ENCOUNTER — Telehealth: Payer: Self-pay | Admitting: *Deleted

## 2018-08-10 ENCOUNTER — Telehealth: Payer: Self-pay | Admitting: Registered Nurse

## 2018-08-10 DIAGNOSIS — M4316 Spondylolisthesis, lumbar region: Secondary | ICD-10-CM

## 2018-08-10 DIAGNOSIS — M5136 Other intervertebral disc degeneration, lumbar region: Secondary | ICD-10-CM

## 2018-08-10 MED ORDER — FENTANYL 25 MCG/HR TD PT72: 25.0000 ug | MEDICATED_PATCH | TRANSDERMAL | 0 refills | Status: DC

## 2018-08-10 MED ORDER — FENTANYL 12 MCG/HR TD PT72: 12.5000 ug | MEDICATED_PATCH | TRANSDERMAL | 0 refills | Status: DC

## 2018-08-10 MED ORDER — HYDROCODONE-ACETAMINOPHEN 10-325 MG PO TABS: 1.0000 | ORAL_TABLET | Freq: Four times a day (QID) | ORAL | 0 refills | Status: DC | PRN

## 2018-08-10 NOTE — Telephone Encounter (Signed)
Spoke with Dr. Naaman Plummer regarding Ms. Neuner, she will be mailed a Formal warning letter regarding her self medicating by increasing her Hydrocodone tablets without consulting the provider. If she has any deviation from her medication regimen this will lead to discharge from our office.  We will began a slow weaning of her  Fentanyl, [facility] pharmacy was called and the Fentanyl 50 mcg prescription was removed and her Fentanyl will be decreased to 37.5 MCG. Dr. Naaman Plummer in agreement with plan.

## 2018-08-10 NOTE — Telephone Encounter (Signed)
A final written warning for taking more medications than was directed. It was sent via MyChart and will be mailed today.

## 2018-08-15 ENCOUNTER — Other Ambulatory Visit: Payer: Self-pay | Admitting: Physical Medicine & Rehabilitation

## 2018-08-17 ENCOUNTER — Telehealth: Payer: Self-pay | Admitting: Registered Nurse

## 2018-08-17 NOTE — Telephone Encounter (Signed)
Placed a call tp Jill Phillips, she is aware that her Fentanyl was decreased due to self medicating, she verbalizes understanding.Jill Phillips reports she found her hydrocodone bottle it had  34 tablets of Hydrocodone, she never mentioned this to this provider during last week telephone call. Also stated she told [name] she had Hydrocodone tablets in another purse. Health and History form was reviewed, [name] documented she left medication at home, no mentioned of tablets. Jill Phillips understands her Fentanyl change will remain the same.

## 2018-08-20 ENCOUNTER — Ambulatory Visit (INDEPENDENT_AMBULATORY_CARE_PROVIDER_SITE_OTHER): Payer: Medicare Other | Admitting: Specialist

## 2018-08-20 ENCOUNTER — Encounter (INDEPENDENT_AMBULATORY_CARE_PROVIDER_SITE_OTHER): Payer: Self-pay | Admitting: Specialist

## 2018-08-20 VITALS — BP 138/88 | HR 106 | Ht 63.0 in | Wt 153.0 lb

## 2018-08-20 DIAGNOSIS — S46212A Strain of muscle, fascia and tendon of other parts of biceps, left arm, initial encounter: Secondary | ICD-10-CM | POA: Diagnosis not present

## 2018-08-20 DIAGNOSIS — Q7649 Other congenital malformations of spine, not associated with scoliosis: Secondary | ICD-10-CM | POA: Diagnosis not present

## 2018-08-20 DIAGNOSIS — E119 Type 2 diabetes mellitus without complications: Secondary | ICD-10-CM | POA: Diagnosis not present

## 2018-08-20 DIAGNOSIS — M75122 Complete rotator cuff tear or rupture of left shoulder, not specified as traumatic: Secondary | ICD-10-CM

## 2018-08-20 MED ORDER — BUPIVACAINE HCL 0.25 % IJ SOLN
4.0000 mL | INTRAMUSCULAR | Status: AC | PRN
  Administered 2018-08-20: 4 mL via INTRA_ARTICULAR

## 2018-08-20 MED ORDER — METHYLPREDNISOLONE ACETATE 40 MG/ML IJ SUSP
40.0000 mg | INTRAMUSCULAR | Status: AC | PRN
  Administered 2018-08-20: 40 mg via INTRA_ARTICULAR

## 2018-08-20 NOTE — Patient Instructions (Addendum)
Plan: Avoid overhead lifting and overhead use of the arms. Do not lift greater than 5 lbs. Tylenol ES one every 6-8 hours for pain and inflamation.  Continue with ROM of the left shoulder using gravity to assist.  Ice to the left shoulder as needed.  Referral to Dr. Ninfa Linden for consideration of left shoulder arthroscopy with  Repair of the rotator cuff.  Possible biceps tenodesis.

## 2018-08-20 NOTE — Progress Notes (Signed)
Office Visit Note   Patient: Jill Phillips           Date of Birth: 03/29/1956           MRN: [id number] Visit Date: 08/20/2018              Requested by: [name], MD [place], [place] [address] PCP: [name], MD   Assessment & Plan: Visit Diagnoses:  1. Nontraumatic complete tear of left rotator cuff   2. Congenital fusion of thoracolumbar spine   3. Type 2 diabetes mellitus without complication, without long-term current use of insulin ([place])   4. Rupture of left biceps tendon, initial encounter     Plan: Avoid overhead lifting and overhead use of the arms. Do not lift greater than 5 lbs. Tylenol ES one every 6-8 hours for pain and inflamation.  Continue with ROM of the left shoulder using gravity to assist.  Ice to the left shoulder as needed.  Follow-Up Instructions: Return in about 2 weeks (around 09/03/2018), or Dr. Ninfa Linden for evaluation and treatment of  Rotator cuff tear..   Orders:  Orders Placed This Encounter  Procedures  . Large Joint Inj   No orders of the defined types were placed in this encounter.     Procedures: Large Joint Inj: L glenohumeral on 08/20/2018 2:42 PM Indications: pain Details: 25 G 1.5 in needle, anterior approach  Arthrogram: No  Medications: 40 mg methylPREDNISolone acetate 40 MG/ML; 4 mL bupivacaine 0.25 % Outcome: tolerated well, no immediate complications  Bandaid applied to the left shoulder. Procedure, treatment alternatives, risks and benefits explained, specific risks discussed. Consent was given by the patient. Immediately prior to procedure a time out was called to verify the correct patient, procedure, equipment, support staff and site/side marked as required. Patient was prepped and draped in the usual sterile fashion.       Clinical Data: Findings:  CLINICAL DATA:  Left shoulder pain and limited range of motion for 2 months. No known injury.  EXAM: MRI OF THE LEFT SHOULDER WITHOUT  CONTRAST  TECHNIQUE: Multiplanar, multisequence MR imaging of the shoulder was performed. No intravenous contrast was administered.  COMPARISON:  None.  FINDINGS: Rotator cuff: The supraspinatus is completely torn and retracted nearly to the glenoid, 3.5-4.5 cm. There is a partial width full-thickness tear of the superior fibers of the subscapularis measuring approximately 0.5 cm craniocaudal with 1-2 cm of retraction. Infraspinatus tendinopathy without tear is also noted.  Muscles:  Preserved without atrophy or focal lesion.  Biceps long head:  Completely torn from the superior labrum.  Acromioclavicular Joint: Bulky degenerative disease is seen. Type 2 acromion. There is subacromial spurring.  Glenohumeral Joint: Negative.  Labrum:  The superior labrum is degenerated without focal tear.  Bones:  No fracture or worrisome lesion.  Other: None.  IMPRESSION: Complete supraspinatus tendon tear with 3.5-4.5 cm of retraction. Partial tear of the superior subscapularis measures approximately 0.5 cm craniocaudal with 1-2 cm of retraction. No atrophy.  Complete tear of the long head of biceps from the superior labrum.  Bulky acromioclavicular osteoarthritis. Subacromial spurring also noted.   Electronically Signed   By: Inge Rise M.D.   On: 07/28/2018 13:25     Subjective: Chief Complaint  Patient presents with  . Neck - Follow-up  . Left Shoulder - Follow-up    MRI Review    62 year old female with 4 month history of left shoulder pain and weakness with  pain all the way down into the left hand. Pain with raising the arm and using the left hand even for using a telephone. Saw Dr. Tessa Lerner and a MRI was done showing  A complete left RCT and a biceps tendon rupture at the superior glenoid. She is having severe pain and discomfort that is diffuse into the left arm and left hand.  The injection by Dr. Tessa Lerner was given directly lateral and did not  help with the pain.     Review of Systems  Constitutional: Negative.   HENT: Negative.   Eyes: Negative.   Respiratory: Negative.   Cardiovascular: Negative.   Gastrointestinal: Negative.   Endocrine: Negative.   Genitourinary: Negative.   Musculoskeletal: Negative.   Skin: Negative.   Allergic/Immunologic: Negative.   Neurological: Negative.   Hematological: Negative.   Psychiatric/Behavioral: Negative.      Objective: Vital Signs: BP 138/88 (BP Location: Right Arm, Patient Position: Sitting)   Pulse (!) 106   Ht 5\' 3"  (1.6 m)   Wt 153 lb (69.4 kg)   BMI 27.10 kg/m   Physical Exam Constitutional:      Appearance: She is well-developed.  HENT:     Head: Normocephalic and atraumatic.  Eyes:     Pupils: Pupils are equal, round, and reactive to light.  Neck:     Musculoskeletal: Normal range of motion and neck supple.  Pulmonary:     Effort: Pulmonary effort is normal.     Breath sounds: Normal breath sounds.  Abdominal:     General: Bowel sounds are normal.     Palpations: Abdomen is soft.  Skin:    General: Skin is warm and dry.  Neurological:     Mental Status: She is alert and oriented to person, place, and time.  Psychiatric:        Behavior: Behavior normal.        Thought Content: Thought content normal.        Judgment: Judgment normal.     Left Shoulder Exam   Tenderness  The patient is experiencing tenderness in the acromion, biceps tendon and acromioclavicular joint.  Range of Motion  Active abduction:  30 abnormal  Passive abduction: 100  Extension: 30  External rotation: 60  Forward flexion: 60  Internal rotation 0 degrees: L5  Internal rotation 90 degrees: 0   Muscle Strength  Abduction: 3/5  Internal rotation: 4/5  External rotation: 4/5  Supraspinatus: 3/5  Subscapularis: 4/5  Biceps: 5/5   Tests  Apprehension: positive Hawkins test: negative Cross arm: negative Impingement: negative Drop arm: positive Sulcus:  absent  Other  Erythema: absent Scars: absent Sensation: normal Pulse: present   Comments:  Positive empty can sign.      Specialty Comments:  No specialty comments available.  Imaging: No results found.   PMFS History: Patient Active Problem List   Diagnosis Date Noted  . Degenerative disc disease, lumbar 08/01/2015    Priority: High    Class: Chronic  . Spondylolisthesis of lumbar region 08/01/2015    Priority: High    Class: Chronic  . Disorder of rotator cuff syndrome of left shoulder and allied disorder 06/22/2018  . Leg edema, left 06/04/2018  . Type 2 diabetes mellitus without complication, without long-term current use of insulin ([place]) 12/02/2017  . Sacral pain 09/17/2017  . COPD (chronic obstructive pulmonary disease) ([place]) 04/21/2017  . Lower back pain 03/03/2017  . CKD (chronic kidney disease) stage 3, GFR 30-59 ml/min ([facility]) 08/07/2016  . GERD (  gastroesophageal reflux disease) 08/07/2016  . Chronic respiratory failure ([place]) 07/29/2016  . Hypersomnia 07/29/2016  . Arthritis of carpometacarpal [facility]) joint of left thumb 07/09/2016  . Pneumothorax on left 01/31/2016  . Chronic, continuous use of opioids 09/06/2015  . Urinary, incontinence, stress female 05/06/2014  . Constipation 05/05/2014  . 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 HTN (hypertension) 05/19/2013  . HLD (hyperlipidemia) 05/19/2013  . Depression 05/19/2013  . Hypothyroidism 05/19/2013  . Tobacco abuse   . Osteoarthritis of both knees 11/18/2011  . RLS (restless legs syndrome) 11/18/2011   Past Medical History:  Diagnosis Date  . Active smoker   . Anxiety   . Calcifying tendinitis of shoulder   . Chronic back pain   . Chronic pain syndrome   . COPD (chronic obstructive pulmonary disease) ([place])   . Dysthymic disorder   . Emphysema lung ([place])   . GERD (gastroesophageal reflux disease)   . Headache    "weekly maybe" (01/31/2016)  . Heart murmur    for years, nothing to be concerned about  .  Herpes genitalia   . History of blood transfusion 1980   related to "back surgery"  . Hyperlipidemia   . Hypertension   . Hypothyroidism   . Lumbago   . Osteoarthrosis, unspecified whether generalized or localized, lower leg   . Pain in joint, upper arm   . Pneumothorax, left 01/31/2016   S/P Left posterior subcostal pain injection on 01/30/2016  . PONV (postoperative nausea and vomiting)    gets nauseous  with longer surgery. Difficuty voiding after surgery  . Postlaminectomy syndrome, thoracic region   . Primary localized osteoarthrosis, lower leg   . Restless leg syndrome   . Sleep apnea    s/p surgery- last sleep study 2011- doesnt use oxygen or machine at night as instructed,.   12/2014- Dr Halford Chessman  reports it is negative.  . Thyroid disease     Family History  Problem Relation Age of Onset  . Kidney disease Mother   . Heart disease Father   . Anuerysm Brother 29       brain  . Heart disease Brother   . Heart disease Sister 61       s/p CABG  . Hypertension Sister   . Colon cancer Neg Hx     Past Surgical History:  Procedure Laterality Date  . APPENDECTOMY    . BACK SURGERY     18 back surgeries (2 thoracic & 16 lumbar) (01/31/2016)  . DILATION AND CURETTAGE OF UTERUS    . HAMMER TOE SURGERY    . IR RADIOLOGIST EVAL & MGMT  07/21/2017  . JOINT REPLACEMENT    . KNEE ARTHROSCOPY Right   . LAPAROSCOPIC CHOLECYSTECTOMY    . LUMBAR FUSION N/A 08/01/2015   Procedure: Right sided L1-2 and L2-3 transforaminal lumbar interbody fusion with cages, Extension of posterior fusion T12 to L3, Replaced pedicle screws bilaterally L1-L2 , Replaced left sided pedicle screws L-3. Instrumentation T12 to L3 using local bone graft, Vivigen allograft and cancellous chips;  Surgeon: [name], MD;  Location: [facility];  Service: Orthopedics;  Laterality: N/A;  . LUMBAR LAMINECTOMY/DECOMPRESSION MICRODISCECTOMY N/A 01/28/2014   Procedure: Minimally Invasive Right  L1-2 Microdiscectomy;  Surgeon:  [name], MD;  Location: [facility];  Service: Orthopedics;  Laterality: N/A;  . TOTAL HIP ARTHROPLASTY Right   . TOTAL KNEE ARTHROPLASTY  05/29/2012   Procedure: TOTAL KNEE ARTHROPLASTY;  Surgeon: Mcarthur Rossetti, MD;  Location: [facility];  Service: Orthopedics;  Laterality: Right;  Right Total Knee Arthroplasty  . TUBAL LIGATION    . UVULOPALATOPHARYNGOPLASTY    . VAGINAL HYSTERECTOMY     Social History   Occupational History  . Occupation: disability    Comment: back surgeries  Tobacco Use  . Smoking status: Current Every Day Smoker    Packs/day: 1.25    Years: 45.00    Pack years: 56.25    Types: Cigarettes  . Smokeless tobacco: Never Used  . Tobacco comment: form given 12/25/16  Substance and Sexual Activity  . Alcohol use: No    Alcohol/week: 0.0 standard drinks  . Drug use: No    Types: Marijuana    Comment: 01/31/2016 "none since ~ 1980"  . Sexual activity: Never    Partners: Male

## 2018-08-21 ENCOUNTER — Telehealth: Payer: Self-pay | Admitting: *Deleted

## 2018-08-21 NOTE — Telephone Encounter (Signed)
Patient reports she has an appointment with Dr. Rush Farmer about her shoulder on 08/31/2018.  Patient reports that Dr. [name] gave her a long acting steroid injection. That is all....FYI

## 2018-08-24 ENCOUNTER — Telehealth: Payer: Self-pay | Admitting: *Deleted

## 2018-08-24 NOTE — Telephone Encounter (Signed)
E[place] called and is asking for her fentanyl to be raised back up because of her restless legs bothering her since it was lowered.  She has been counseled by Zella Ball on the wean down on the fentanyl.  Please advise.  She says she cannot stand this through Christmas.

## 2018-08-27 ENCOUNTER — Ambulatory Visit (INDEPENDENT_AMBULATORY_CARE_PROVIDER_SITE_OTHER): Payer: Medicare Other | Admitting: Specialist

## 2018-08-27 NOTE — Telephone Encounter (Signed)
Fentanyl doesn't treat RLS. Jill Phillips and I have spent extensive time with patient and as a team, discussing her care, taper of medications, etc. There will be no changes to narcotic plan of care. Furthermore, her fentanyl was only minimally decreased to 37.5 mcg.

## 2018-08-28 ENCOUNTER — Other Ambulatory Visit: Payer: Self-pay | Admitting: Internal Medicine

## 2018-08-28 NOTE — Telephone Encounter (Signed)
Contacted patient and informed.  Patient disputes claim, but, nevertheless, she verbalized understanding

## 2018-08-31 ENCOUNTER — Other Ambulatory Visit: Payer: Self-pay | Admitting: Internal Medicine

## 2018-08-31 ENCOUNTER — Encounter (INDEPENDENT_AMBULATORY_CARE_PROVIDER_SITE_OTHER): Payer: Self-pay | Admitting: Orthopaedic Surgery

## 2018-08-31 ENCOUNTER — Ambulatory Visit (INDEPENDENT_AMBULATORY_CARE_PROVIDER_SITE_OTHER): Payer: Medicare Other | Admitting: Orthopaedic Surgery

## 2018-08-31 DIAGNOSIS — M75122 Complete rotator cuff tear or rupture of left shoulder, not specified as traumatic: Secondary | ICD-10-CM

## 2018-08-31 NOTE — Progress Notes (Signed)
The patient is someone to Dr. [name] sent me to evaluate and treat for possible surgery on her left shoulder.  She has an MRI that shows a chronic retracted rotator cuff tear back to the level of the glenoid.  There is also a complete tear of the proximal biceps tendon.  There is bulky arthritis at the [facility] joint.  She is very tearful having severe pain in that shoulder.  Is been going on for about 4 months and worsening.  She is also someone in chronic pain management.  She has had multiple surgeries of her spine in the past.  I have actually replaced 1 of her knees in 2013.  On exam I can move her shoulder around but it certainly weak and painful and she is very tearful.  There is not severe guarding of the shoulder.  The MRI does confirm the a forementioned findings of her shoulder.  She is someone who is also in chronic pain management on high-dose hydrocodone and fentanyl patches.  I told her I would like to at least send her to Dr. Marlou Sa given the multitude of findings of her shoulder to see if he feels that he would be comfortable performing surgery on her left shoulder but I told her from my standpoint I am not comfortable with performing surgery on her shoulder.

## 2018-09-03 ENCOUNTER — Other Ambulatory Visit: Payer: Self-pay | Admitting: Internal Medicine

## 2018-09-04 ENCOUNTER — Ambulatory Visit (INDEPENDENT_AMBULATORY_CARE_PROVIDER_SITE_OTHER): Payer: Medicare Other | Admitting: Orthopedic Surgery

## 2018-09-07 ENCOUNTER — Ambulatory Visit (INDEPENDENT_AMBULATORY_CARE_PROVIDER_SITE_OTHER): Payer: Medicare Other | Admitting: Orthopedic Surgery

## 2018-09-07 ENCOUNTER — Telehealth (INDEPENDENT_AMBULATORY_CARE_PROVIDER_SITE_OTHER): Payer: Self-pay | Admitting: Orthopedic Surgery

## 2018-09-07 NOTE — Telephone Encounter (Signed)
Returned call to patient left message to call back concerning her appt today with Dr. Marlou Sa   Need to know if she want to reschedule

## 2018-09-11 ENCOUNTER — Telehealth: Payer: Self-pay | Admitting: *Deleted

## 2018-09-11 NOTE — Telephone Encounter (Signed)
Jill Phillips has called and is saying that everything hurts and her restless legs are driving her crazy.  She is in so much pain and is wanting to know why she can not h ave her patches back to where they were and get her pain under control.  Please advise.

## 2018-09-11 NOTE — Telephone Encounter (Signed)
Return Jill Phillips call, she wants to have her Fentanyl increased. I explained to Jill Phillips, Fentanyl is not the treatment regimen for Restless Legs. She became very argumentative,  Also states she has never violated her contract, her next scheduled appointment will be changed from 09/14/2018 to 09/16/2018 with Dr. Naaman Plummer. Office staff will call her transportation services, since she has  A 3 day widow of cancellation. We will call her once transportation is set up, she verbalizes understanding. Jill Phillips is aware of the above.

## 2018-09-14 ENCOUNTER — Ambulatory Visit: Payer: Medicare Other | Admitting: Registered Nurse

## 2018-09-14 ENCOUNTER — Encounter: Payer: Medicare Other | Admitting: Registered Nurse

## 2018-09-16 ENCOUNTER — Encounter: Payer: Self-pay | Admitting: Physical Medicine & Rehabilitation

## 2018-09-16 ENCOUNTER — Telehealth (INDEPENDENT_AMBULATORY_CARE_PROVIDER_SITE_OTHER): Payer: Self-pay | Admitting: Specialist

## 2018-09-16 ENCOUNTER — Other Ambulatory Visit: Payer: Self-pay

## 2018-09-16 ENCOUNTER — Encounter: Payer: Medicare Other | Attending: Physical Medicine & Rehabilitation | Admitting: Physical Medicine & Rehabilitation

## 2018-09-16 ENCOUNTER — Ambulatory Visit (INDEPENDENT_AMBULATORY_CARE_PROVIDER_SITE_OTHER): Payer: Medicare Other | Admitting: Orthopedic Surgery

## 2018-09-16 VITALS — BP 145/84 | HR 97 | Ht 63.0 in | Wt 154.0 lb

## 2018-09-16 DIAGNOSIS — M170 Bilateral primary osteoarthritis of knee: Secondary | ICD-10-CM | POA: Insufficient documentation

## 2018-09-16 DIAGNOSIS — Z5181 Encounter for therapeutic drug level monitoring: Secondary | ICD-10-CM | POA: Insufficient documentation

## 2018-09-16 DIAGNOSIS — M2170 Unequal limb length (acquired), unspecified site: Secondary | ICD-10-CM | POA: Insufficient documentation

## 2018-09-16 DIAGNOSIS — I10 Essential (primary) hypertension: Secondary | ICD-10-CM | POA: Diagnosis not present

## 2018-09-16 DIAGNOSIS — G9619 Other disorders of meninges, not elsewhere classified: Secondary | ICD-10-CM | POA: Insufficient documentation

## 2018-09-16 DIAGNOSIS — Z9889 Other specified postprocedural states: Secondary | ICD-10-CM | POA: Insufficient documentation

## 2018-09-16 DIAGNOSIS — Z720 Tobacco use: Secondary | ICD-10-CM | POA: Diagnosis not present

## 2018-09-16 DIAGNOSIS — G2581 Restless legs syndrome: Secondary | ICD-10-CM | POA: Insufficient documentation

## 2018-09-16 DIAGNOSIS — M5126 Other intervertebral disc displacement, lumbar region: Secondary | ICD-10-CM | POA: Diagnosis not present

## 2018-09-16 DIAGNOSIS — E079 Disorder of thyroid, unspecified: Secondary | ICD-10-CM | POA: Insufficient documentation

## 2018-09-16 DIAGNOSIS — K219 Gastro-esophageal reflux disease without esophagitis: Secondary | ICD-10-CM | POA: Insufficient documentation

## 2018-09-16 DIAGNOSIS — J449 Chronic obstructive pulmonary disease, unspecified: Secondary | ICD-10-CM | POA: Diagnosis not present

## 2018-09-16 DIAGNOSIS — M75102 Unspecified rotator cuff tear or rupture of left shoulder, not specified as traumatic: Secondary | ICD-10-CM | POA: Diagnosis not present

## 2018-09-16 DIAGNOSIS — Z9981 Dependence on supplemental oxygen: Secondary | ICD-10-CM | POA: Insufficient documentation

## 2018-09-16 DIAGNOSIS — Z79899 Other long term (current) drug therapy: Secondary | ICD-10-CM | POA: Insufficient documentation

## 2018-09-16 DIAGNOSIS — M961 Postlaminectomy syndrome, not elsewhere classified: Secondary | ICD-10-CM | POA: Diagnosis not present

## 2018-09-16 DIAGNOSIS — E039 Hypothyroidism, unspecified: Secondary | ICD-10-CM | POA: Diagnosis not present

## 2018-09-16 DIAGNOSIS — IMO0002 Reserved for concepts with insufficient information to code with codable children: Secondary | ICD-10-CM

## 2018-09-16 DIAGNOSIS — M4316 Spondylolisthesis, lumbar region: Secondary | ICD-10-CM | POA: Diagnosis not present

## 2018-09-16 DIAGNOSIS — E785 Hyperlipidemia, unspecified: Secondary | ICD-10-CM | POA: Diagnosis not present

## 2018-09-16 DIAGNOSIS — G894 Chronic pain syndrome: Secondary | ICD-10-CM | POA: Diagnosis not present

## 2018-09-16 MED ORDER — HYDROCODONE-ACETAMINOPHEN 10-325 MG PO TABS: 1.0000 | ORAL_TABLET | Freq: Four times a day (QID) | ORAL | 0 refills | Status: DC | PRN

## 2018-09-16 MED ORDER — FENTANYL 50 MCG/HR TD PT72: 1.0000 | MEDICATED_PATCH | TRANSDERMAL | 0 refills | Status: DC

## 2018-09-16 NOTE — Telephone Encounter (Signed)
Patient called wanting to get a letter from Dr. [name] that states her daughter is her caregiver.  OB#[phone].  Thank you.

## 2018-09-16 NOTE — Addendum Note (Signed)
Addended by: Jasmine December T on: 09/16/2018 10:40 AM   Modules accepted: Orders

## 2018-09-16 NOTE — Progress Notes (Signed)
Subjective:    Patient ID: Jill Phillips, female    DOB: Jan 25, 1956, 63 y.o.   MRN: [id number]  HPI   Jill Phillips is back regarding her chronic pain.  We have initiated a slow wean of her fentanyl last month given some inconsistencies with her narcotic use.  Patient states that since we drop the fentanyl to 37-1/2 mcg that her restless leg symptoms have significantly worsened.  She felt that the 50 mcg had really been the only thing to help control her leg pain.  Additionally she continues with orthopedic surgery regarding her left shoulder.  She has received further injections and may ultimately be a surgical candidate as well.  Other than the fentanyl she remains on hydrocodone 10/325 1 every 6 hours as needed.  She is also on Requip at night as well as scheduled Voltaren and gabapentin.  I asked her how much she is smoking and she tells me she is smoking about a pack and a half of cigarettes per day.  She brought in her health history sheet today was actually was brown from the tobacco in her home.  Pain Inventory Average Pain 8 Pain Right Now 8 My pain is sharp, dull, stabbing, tingling and aching  In the last 24 hours, has pain interfered with the following? General activity 10 Relation with others 10 Enjoyment of life 10 What TIME of day is your pain at its worst? daytime Sleep (in general) Fair  Pain is worse with: inactivity and some activites Pain improves with: medication Relief from Meds: 3  Mobility Do you have any goals in this area?  no  Function Do you have any goals in this area?  no  Neuro/Psych No problems in this area  Prior Studies Any changes since last visit?  no  Physicians involved in your care Any changes since last visit?  no   Family History  Problem Relation Age of Onset  . Kidney disease Mother   . Heart disease Father   . Anuerysm Brother 29       brain  . Heart disease Brother   . Heart disease Sister 54       s/p CABG  .  Hypertension Sister   . Colon cancer Neg Hx    Social History   Socioeconomic History  . Marital status: Divorced    Spouse name: n/a  . Number of children: 2  . Years of education: 12+  . Highest education level: Not on file  Occupational History  . Occupation: disability    Comment: back surgeries  Social Needs  . Financial resource strain: Not on file  . Food insecurity:    Worry: Not on file    Inability: Not on file  . Transportation needs:    Medical: Not on file    Non-medical: Not on file  Tobacco Use  . Smoking status: Current Every Day Smoker    Packs/day: 1.25    Years: 45.00    Pack years: 56.25    Types: Cigarettes  . Smokeless tobacco: Never Used  . Tobacco comment: form given 12/25/16  Substance and Sexual Activity  . Alcohol use: No    Alcohol/week: 0.0 standard drinks  . Drug use: No    Types: Marijuana    Comment: 01/31/2016 "none since ~ 1980"  . Sexual activity: Never    Partners: Male  Lifestyle  . Physical activity:    Days per week: Not on file    Minutes per session:  Not on file  . Stress: Not on file  Relationships  . Social connections:    Talks on phone: Not on file    Gets together: Not on file    Attends religious service: Not on file    Active member of club or organization: Not on file    Attends meetings of clubs or organizations: Not on file    Relationship status: Not on file  Other Topics Concern  . Not on file  Social History Narrative   Lives alone.  One daughter is local, but is getting ready to move to [place], where her children live with their father.  The other daughter lives near [place], [place].   Past Surgical History:  Procedure Laterality Date  . APPENDECTOMY    . BACK SURGERY     18 back surgeries (2 thoracic & 16 lumbar) (01/31/2016)  . DILATION AND CURETTAGE OF UTERUS    . HAMMER TOE SURGERY    . IR RADIOLOGIST EVAL & MGMT  07/21/2017  . JOINT REPLACEMENT    . KNEE ARTHROSCOPY Right   . LAPAROSCOPIC  CHOLECYSTECTOMY    . LUMBAR FUSION N/A 08/01/2015   Procedure: Right sided L1-2 and L2-3 transforaminal lumbar interbody fusion with cages, Extension of posterior fusion T12 to L3, Replaced pedicle screws bilaterally L1-L2 , Replaced left sided pedicle screws L-3. Instrumentation T12 to L3 using local bone graft, Vivigen allograft and cancellous chips;  Surgeon: [name], MD;  Location: [facility];  Service: Orthopedics;  Laterality: N/A;  . LUMBAR LAMINECTOMY/DECOMPRESSION MICRODISCECTOMY N/A 01/28/2014   Procedure: Minimally Invasive Right  L1-2 Microdiscectomy;  Surgeon: [name], MD;  Location: [facility];  Service: Orthopedics;  Laterality: N/A;  . TOTAL HIP ARTHROPLASTY Right   . TOTAL KNEE ARTHROPLASTY  05/29/2012   Procedure: TOTAL KNEE ARTHROPLASTY;  Surgeon: Mcarthur Rossetti, MD;  Location: [facility];  Service: Orthopedics;  Laterality: Right;  Right Total Knee Arthroplasty  . TUBAL LIGATION    . UVULOPALATOPHARYNGOPLASTY    . VAGINAL HYSTERECTOMY     Past Medical History:  Diagnosis Date  . Active smoker   . Anxiety   . Calcifying tendinitis of shoulder   . Chronic back pain   . Chronic pain syndrome   . COPD (chronic obstructive pulmonary disease) ([place])   . Dysthymic disorder   . Emphysema lung ([place])   . GERD (gastroesophageal reflux disease)   . Headache    "weekly maybe" (01/31/2016)  . Heart murmur    for years, nothing to be concerned about  . Herpes genitalia   . History of blood transfusion 1980   related to "back surgery"  . Hyperlipidemia   . Hypertension   . Hypothyroidism   . Lumbago   . Osteoarthrosis, unspecified whether generalized or localized, lower leg   . Pain in joint, upper arm   . Pneumothorax, left 01/31/2016   S/P Left posterior subcostal pain injection on 01/30/2016  . PONV (postoperative nausea and vomiting)    gets nauseous  with longer surgery. Difficuty voiding after surgery  . Postlaminectomy syndrome, thoracic region   . Primary  localized osteoarthrosis, lower leg   . Restless leg syndrome   . Sleep apnea    s/p surgery- last sleep study 2011- doesnt use oxygen or machine at night as instructed,.   12/2014- Dr Halford Chessman  reports it is negative.  . Thyroid disease    BP (!) 145/84   Pulse 97   Ht 5\' 3"  (1.6 m)  Wt 154 lb (69.9 kg)   SpO2 90%   BMI 27.28 kg/m   Opioid Risk Score:   Fall Risk Score:  `1  Depression screen PHQ 2/9  Depression screen [facility] 2/9 06/22/2018 01/01/2018 12/01/2017 10/10/2017 09/10/2017 06/13/2017 05/15/2016  Decreased Interest 0 0 0 1 1 0 0  Down, Depressed, Hopeless 0 0 0 1 1 0 0  PHQ - 2 Score 0 0 0 2 2 0 0  Some recent data might be hidden    Review of Systems  Constitutional: Negative.   HENT: Negative.   Eyes: Negative.   Respiratory: Negative.   Cardiovascular: Negative.   Gastrointestinal: Negative.   Endocrine: Negative.   Genitourinary: Negative.   Musculoskeletal: Positive for arthralgias, back pain and myalgias.  Skin: Negative.   Allergic/Immunologic: Negative.   Neurological: Negative.   Hematological: Negative.   Psychiatric/Behavioral: Negative.   All other systems reviewed and are negative.      Objective:   Physical Exam General: No acute distress HEENT: EOMI, oral membranes moist Cards: reg rate  Chest: normal effort Abdomen: Soft, NT, ND Skin: dry, intact Extremities: no edema   Musculoskeletal:   Low back is tender to palpation.  Range of motion fair.  Left shoulder limited external and internal rotation with pain along the anterior aspect along biceps tendons.  Narrow Based gait   Neurological: She is alert and oriented to person, place, and time.  Skin: Skin is warm and dry.  Psychiatric: Tearful at times and remorse today.       Assessment & Plan:  1. Chronic lumbar spine pain/post-lami syndrome: 09/10/2017 Will resume fentanyl at 50 mcg every 72 hours.  I also refilled her hydrocodone 10/325 1 every 6 hours as needed #120.  We will  continue the controlled substance monitoring program, this consists of regular clinic visits, examinations, routine drug screening, pill counts as well as use of [place] Controlled Substance Reporting System. [place] today.   We had a serious talk regarding her ongoing treatment here and compliance moving forward.  I stated that any further inconsistencies with her medications, dosing, behaviors, or testing would mean discharge from our clinic.  This is absolutely the last chance that she has moving forward.   2. Chronic Thoracic Pain: : Maintain home exercise program 3. Osteoarthritis of left knee:  Continue per orthopedics 4.  Left rotator cuff syndrome/bicipital tendon tear: Continue per orthopedic surgery 5. Restless legs syndrome: Continue Requip.Continue to monitor. 09/10/2017 6. Tobacco Abuse: Continue Smoking Cessation: Discussed the impact of tobacco on her pain as well as metabolism of her medications let alone her lung and cardiac function.  She states that she will make a more earnest attempt to decrease and perhaps discuss further with her primary regarding other ways to stop smoking. 7. Muscle Spasm: Continue Tizanidine. 8. Insomnia: Continue Seroquel. 9. Neuropathic Pain: Continue Gabapentin. 10..Anxiety: Continue Klonopin.  11.. Chronic Rectal Pain: Has a scheduled appointment with Dr. Lorin Mercy   25  minutes of face to face patient care time was spent during this visit. All questions were encouraged and answered.  F/u in 1 month with NP

## 2018-09-16 NOTE — Patient Instructions (Signed)
PLEASE FEEL FREE TO CALL OUR OFFICE WITH ANY PROBLEMS OR QUESTIONS ([phone])      

## 2018-09-18 ENCOUNTER — Encounter (INDEPENDENT_AMBULATORY_CARE_PROVIDER_SITE_OTHER): Payer: Self-pay | Admitting: Radiology

## 2018-09-18 NOTE — Telephone Encounter (Signed)
I wrot e her the note and put it at the front desk for pick up.  Patient is aware

## 2018-09-23 ENCOUNTER — Ambulatory Visit (INDEPENDENT_AMBULATORY_CARE_PROVIDER_SITE_OTHER): Payer: Medicare Other | Admitting: Specialist

## 2018-09-24 ENCOUNTER — Other Ambulatory Visit: Payer: Self-pay | Admitting: Registered Nurse

## 2018-09-24 ENCOUNTER — Other Ambulatory Visit: Payer: Self-pay | Admitting: Internal Medicine

## 2018-10-08 ENCOUNTER — Ambulatory Visit (INDEPENDENT_AMBULATORY_CARE_PROVIDER_SITE_OTHER): Payer: Medicare Other | Admitting: Specialist

## 2018-10-08 ENCOUNTER — Ambulatory Visit (INDEPENDENT_AMBULATORY_CARE_PROVIDER_SITE_OTHER): Payer: Self-pay

## 2018-10-08 ENCOUNTER — Encounter (INDEPENDENT_AMBULATORY_CARE_PROVIDER_SITE_OTHER): Payer: Self-pay | Admitting: Specialist

## 2018-10-08 VITALS — BP 170/97 | HR 94 | Ht 63.0 in | Wt 154.0 lb

## 2018-10-08 DIAGNOSIS — M75122 Complete rotator cuff tear or rupture of left shoulder, not specified as traumatic: Secondary | ICD-10-CM

## 2018-10-08 DIAGNOSIS — M4325 Fusion of spine, thoracolumbar region: Secondary | ICD-10-CM

## 2018-10-08 DIAGNOSIS — M1812 Unilateral primary osteoarthritis of first carpometacarpal joint, left hand: Secondary | ICD-10-CM

## 2018-10-08 DIAGNOSIS — M1712 Unilateral primary osteoarthritis, left knee: Secondary | ICD-10-CM | POA: Diagnosis not present

## 2018-10-08 NOTE — Progress Notes (Signed)
Office Visit Note   Patient: Jill Phillips           Date of Birth: Aug 11, 1956           MRN: [id number] Visit Date: 10/08/2018              Requested by: [name], MD [place], [place] [address] PCP: [name], MD   Assessment & Plan: Visit Diagnoses:  1. Primary osteoarthritis of first carpometacarpal joint of left hand   2. Unilateral primary osteoarthritis, left knee   3. Nontraumatic complete tear of left rotator cuff   4. Fusion of spine of thoracolumbar region     Plan:  Knee is suffering from osteoarthritis, only real proven treatments are Weight loss, NSIADs like diclofenac and exercise. Well padded shoes help. Ice the knee 2-3 times a day 15-20 mins at a time. Avoid overhead lifting and overhead use of the arms. Pillows to keep from sleeping directly on the shoulders Limited lifting to less than 10 lbs. Ice or heat for relief. NSAIDs are helpful, such as alleve or motrin, be careful not to use in excess as they place burdens on the kidney. Stretching exercise help and strengthening is helpful to build endurance. May use ice 15 minutes at a time to decrease pain due to injection and arthrosis 3 times per day. Use ice to the left shoulder at the site of injection 2-3 times per day for 2 days. Tylenol arthriits strength upt to 3-4 per day. Appt with Dr Marlou Sa for consideration of left shoulder cuff repair vs debridement.  Dr. Erlinda Hong for evaluation of left thumb MC-C arthrosis, severe pain.   Follow-Up Instructions: No follow-ups on file.   Orders:  Orders Placed This Encounter  Procedures  . Large Joint Inj: L glenohumeral  . Large Joint Inj: L knee  . Hand/UE Inj: L thumb CMC   No orders of the defined types were placed in this encounter.     Procedures: Large Joint Inj: L glenohumeral on 10/08/2018 3:13 PM Indications: pain Details: 25 G 1.5 in needle, anterior approach  Arthrogram: No  Medications: 40 mg methylPREDNISolone acetate 40  MG/ML; 4 mL bupivacaine 0.25 % Outcome: tolerated well, no immediate complications  bandaid applied. Procedure, treatment alternatives, risks and benefits explained, specific risks discussed. Consent was given by the patient. Immediately prior to procedure a time out was called to verify the correct patient, procedure, equipment, support staff and site/side marked as required. Patient was prepped and draped in the usual sterile fashion.   Large Joint Inj: L knee on 10/08/2018 3:13 PM Indications: pain Details: 25 G 1.5 in needle, anterolateral approach  Arthrogram: No  Medications: 40 mg methylPREDNISolone acetate 40 MG/ML; 4 mL bupivacaine 0.25 % Outcome: tolerated well, no immediate complications  Bandaid applied.  Procedure, treatment alternatives, risks and benefits explained, specific risks discussed. Consent was given by the patient. Immediately prior to procedure a time out was called to verify the correct patient, procedure, equipment, support staff and site/side marked as required. Patient was prepped and draped in the usual sterile fashion.   Hand/UE Inj: L thumb CMC for osteoarthritis on 10/08/2018 3:15 PM Medications: 20 mg methylPREDNISolone acetate 40 MG/ML      Clinical Data: No additional findings.   Subjective: Chief Complaint  Patient presents with  . Left Shoulder - Pain    Returns today had a left shoulder MRI done which shows left rotator cuff tear that is complete involving  the supraspinatus and a partial tear of the anterior subscapularitis tendon with about 3/4 inch retraction. The supraspinatous is retracted near 1.5 to 2 inches. She is complaining of left medial knee pain ad pain in the left thumb basl joint. Reports that her daughter is having HTN and is taking her mothers medication.    Review of Systems  Constitutional: Negative.   HENT: Negative.   Eyes: Negative.   Respiratory: Negative.   Cardiovascular: Negative.   Gastrointestinal: Negative.     Endocrine: Negative.   Genitourinary: Negative.   Musculoskeletal: Negative.   Skin: Negative.   Allergic/Immunologic: Negative.   Neurological: Negative.   Hematological: Negative.   Psychiatric/Behavioral: Negative.      Objective: Vital Signs: BP (!) 170/97 (BP Location: Left Arm, Patient Position: Sitting)   Pulse 94   Ht 5\' 3"  (1.6 m)   Wt 154 lb (69.9 kg)   BMI 27.28 kg/m   Physical Exam  Left Knee Exam   Muscle Strength  The patient has normal left knee strength.  Tenderness  The patient is experiencing tenderness in the medial joint line.  Range of Motion  Extension: normal  Flexion: normal   Tests  McMurray:  Medial - negative  Lachman:  Anterior - negative    Posterior - negative Drawer:  Anterior - negative     Posterior - negative Pivot shift: negative Patellar apprehension: negative  Other  Erythema: absent Scars: absent Sensation: normal Pulse: present Swelling: mild  Comments:  Tender left medial joint line knee with prominent osteophye. ROM 0-125.   Left Hand Exam   Tenderness  The patient is experiencing tenderness in the radial area.   Range of Motion  Wrist  Extension: normal  Flexion: normal  Pronation: normal  Supination: normal  Hand  MP Thumb: 60  MP Index: normal  PIP Index: normal  DIP Thumb: 80  DIP Index: normal   Muscle Strength  The patient has normal left wrist strength. Wrist extension: 5/5  Wrist flexion: 5/5  Grip:  5/5   Tests  Phalen's Sign: negative Tinel's sign (median nerve): negative Finkelstein's test: negative  Other  Erythema: absent Scars: absent Sensation: normal Pulse: present  Comments:  Positvie grind test left thumb basal joint. Negative MCP joint motion> Pain with rom of the left thumb MC-C joint. Negative finklelstin test, negative tinels left wirdd     Left Elbow Exam   Tenderness  The patient is experiencing tenderness in the radial head and radial capitellar joint.    Range of Motion  Extension: normal  Flexion: normal  Pronation: normal  Supination: normal   Muscle Strength  Pronation:  5/5  Supination:  5/5   Tests  Varus: negative Valgus: negative Tinel's sign (cubital tunnel): negative  Other  Erythema: absent Scars: absent Sensation: normal Pulse: present      Specialty Comments:  No specialty comments available.  Imaging: No results found.   PMFS History: Patient Active Problem List   Diagnosis Date Noted  . Degenerative disc disease, lumbar 08/01/2015    Priority: High    Class: Chronic  . Spondylolisthesis of lumbar region 08/01/2015    Priority: High    Class: Chronic  . Disorder of rotator cuff syndrome of left shoulder and allied disorder 06/22/2018  . Leg edema, left 06/04/2018  . Type 2 diabetes mellitus without complication, without long-term current use of insulin ([facility]) 12/02/2017  . Sacral pain 09/17/2017  . COPD (chronic obstructive pulmonary disease) ([facility]) 04/21/2017  . Lower  back pain 03/03/2017  . CKD (chronic kidney disease) stage 3, GFR 30-59 ml/min ([facility]) 08/07/2016  . GERD (gastroesophageal reflux disease) 08/07/2016  . Chronic respiratory failure ([facility]) 07/29/2016  . Hypersomnia 07/29/2016  . Arthritis of carpometacarpal [facility]) joint of left thumb 07/09/2016  . Pneumothorax on left 01/31/2016  . Chronic, continuous use of opioids 09/06/2015  . Urinary, incontinence, stress female 05/06/2014  . Constipation 05/05/2014  . 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 HTN (hypertension) 05/19/2013  . HLD (hyperlipidemia) 05/19/2013  . Depression 05/19/2013  . Hypothyroidism 05/19/2013  . Tobacco abuse   . Osteoarthritis of both knees 11/18/2011  . RLS (restless legs syndrome) 11/18/2011   Past Medical History:  Diagnosis Date  . Active smoker   . Anxiety   . Calcifying tendinitis of shoulder   . Chronic back pain   . Chronic pain syndrome   . COPD (chronic obstructive pulmonary disease) ([facility] [place])   .  Dysthymic disorder   . Emphysema lung ([facility])   . GERD (gastroesophageal reflux disease)   . Headache    "weekly maybe" (01/31/2016)  . Heart murmur    for years, nothing to be concerned about  . Herpes genitalia   . History of blood transfusion 1980   related to "back surgery"  . Hyperlipidemia   . Hypertension   . Hypothyroidism   . Lumbago   . Osteoarthrosis, unspecified whether generalized or localized, lower leg   . Pain in joint, upper arm   . Pneumothorax, left 01/31/2016   S/P Left posterior subcostal pain injection on 01/30/2016  . PONV (postoperative nausea and vomiting)    gets nauseous  with longer surgery. Difficuty voiding after surgery  . Postlaminectomy syndrome, thoracic region   . Primary localized osteoarthrosis, lower leg   . Restless leg syndrome   . Sleep apnea    s/p surgery- last sleep study 2011- doesnt use oxygen or machine at night as instructed,.   12/2014- Dr Halford Chessman  reports it is negative.  . Thyroid disease     Family History  Problem Relation Age of Onset  . Kidney disease Mother   . Heart disease Father   . Anuerysm Brother 29       brain  . Heart disease Brother   . Heart disease Sister 46       s/p CABG  . Hypertension Sister   . Colon cancer Neg Hx     Past Surgical History:  Procedure Laterality Date  . APPENDECTOMY    . BACK SURGERY     18 back surgeries (2 thoracic & 16 lumbar) (01/31/2016)  . DILATION AND CURETTAGE OF UTERUS    . HAMMER TOE SURGERY    . IR RADIOLOGIST EVAL & MGMT  07/21/2017  . JOINT REPLACEMENT    . KNEE ARTHROSCOPY Right   . LAPAROSCOPIC CHOLECYSTECTOMY    . LUMBAR FUSION N/A 08/01/2015   Procedure: Right sided L1-2 and L2-3 transforaminal lumbar interbody fusion with cages, Extension of posterior fusion T12 to L3, Replaced pedicle screws bilaterally L1-L2 , Replaced left sided pedicle screws L-3. Instrumentation T12 to L3 using local bone graft, Vivigen allograft and cancellous chips;  Surgeon: [name], MD;   Location: [facility];  Service: Orthopedics;  Laterality: N/A;  . LUMBAR LAMINECTOMY/DECOMPRESSION MICRODISCECTOMY N/A 01/28/2014   Procedure: Minimally Invasive Right  L1-2 Microdiscectomy;  Surgeon: [name], MD;  Location: [facility];  Service: Orthopedics;  Laterality: N/A;  . TOTAL HIP ARTHROPLASTY Right   . TOTAL KNEE ARTHROPLASTY  05/29/2012   Procedure: TOTAL KNEE ARTHROPLASTY;  Surgeon: Mcarthur Rossetti, MD;  Location: [facility];  Service: Orthopedics;  Laterality: Right;  Right Total Knee Arthroplasty  . TUBAL LIGATION    . UVULOPALATOPHARYNGOPLASTY    . VAGINAL HYSTERECTOMY     Social History   Occupational History  . Occupation: disability    Comment: back surgeries  Tobacco Use  . Smoking status: Current Every Day Smoker    Packs/day: 1.25    Years: 45.00    Pack years: 56.25    Types: Cigarettes  . Smokeless tobacco: Never Used  . Tobacco comment: form given 12/25/16  Substance and Sexual Activity  . Alcohol use: No    Alcohol/week: 0.0 standard drinks  . Drug use: No    Types: Marijuana    Comment: 01/31/2016 "none since ~ 1980"  . Sexual activity: Never    Partners: Male

## 2018-10-08 NOTE — Patient Instructions (Signed)
Plan:  Knee is suffering from osteoarthritis, only real proven treatments are Weight loss, NSIADs like diclofenac and exercise. Well padded shoes help. Ice the knee 2-3 times a day 15-20 mins at a time. Avoid overhead lifting and overhead use of the arms. Pillows to keep from sleeping directly on the shoulders Limited lifting to less than 10 lbs. Ice or heat for relief. NSAIDs are helpful, such as alleve or motrin, be careful not to use in excess as they place burdens on the kidney. Stretching exercise help and strengthening is helpful to build endurance. May use ice 15 minutes at a time to decrease pain due to injection and arthrosis 3 times per day. Use ice to the left shoulder at the site of injection 2-3 times per day for 2 days. Tylenol arthriits strength upt to 3-4 per day. Appt with Dr Marlou Sa for consideration of left shoulder cuff repair vs debridement.  Dr. Erlinda Hong for evaluation of left thumb MC-C arthrosis, severe pain.

## 2018-10-09 ENCOUNTER — Telehealth: Payer: Self-pay | Admitting: *Deleted

## 2018-10-09 NOTE — Telephone Encounter (Signed)
Eli called and she has appt with Zella Ball @1 :00 on 10/16/18.  She has no car and her pharmacy delivers but they have to receive rx by 1pm in order to deliver over the weekend.  She will not be able to get meds and will be out by then if seen at 1:00.  Can someone call her and see if the appt can be moved up or earlier because Zella Ball will not send in earlier before she is seen.

## 2018-10-16 ENCOUNTER — Other Ambulatory Visit: Payer: Self-pay

## 2018-10-16 ENCOUNTER — Ambulatory Visit: Payer: Medicare Other | Admitting: Registered Nurse

## 2018-10-16 ENCOUNTER — Encounter: Payer: Medicare Other | Attending: Physical Medicine & Rehabilitation | Admitting: Registered Nurse

## 2018-10-16 ENCOUNTER — Encounter: Payer: Self-pay | Admitting: Registered Nurse

## 2018-10-16 VITALS — BP 116/70 | HR 91 | Ht 63.0 in | Wt 149.4 lb

## 2018-10-16 DIAGNOSIS — E039 Hypothyroidism, unspecified: Secondary | ICD-10-CM | POA: Diagnosis not present

## 2018-10-16 DIAGNOSIS — G9619 Other disorders of meninges, not elsewhere classified: Secondary | ICD-10-CM | POA: Diagnosis not present

## 2018-10-16 DIAGNOSIS — M75102 Unspecified rotator cuff tear or rupture of left shoulder, not specified as traumatic: Secondary | ICD-10-CM

## 2018-10-16 DIAGNOSIS — G894 Chronic pain syndrome: Secondary | ICD-10-CM | POA: Diagnosis not present

## 2018-10-16 DIAGNOSIS — I10 Essential (primary) hypertension: Secondary | ICD-10-CM | POA: Insufficient documentation

## 2018-10-16 DIAGNOSIS — Z9889 Other specified postprocedural states: Secondary | ICD-10-CM | POA: Insufficient documentation

## 2018-10-16 DIAGNOSIS — Z720 Tobacco use: Secondary | ICD-10-CM | POA: Insufficient documentation

## 2018-10-16 DIAGNOSIS — Z5181 Encounter for therapeutic drug level monitoring: Secondary | ICD-10-CM | POA: Insufficient documentation

## 2018-10-16 DIAGNOSIS — M62830 Muscle spasm of back: Secondary | ICD-10-CM

## 2018-10-16 DIAGNOSIS — M170 Bilateral primary osteoarthritis of knee: Secondary | ICD-10-CM | POA: Insufficient documentation

## 2018-10-16 DIAGNOSIS — G4709 Other insomnia: Secondary | ICD-10-CM

## 2018-10-16 DIAGNOSIS — M7918 Myalgia, other site: Secondary | ICD-10-CM | POA: Diagnosis not present

## 2018-10-16 DIAGNOSIS — K219 Gastro-esophageal reflux disease without esophagitis: Secondary | ICD-10-CM | POA: Diagnosis not present

## 2018-10-16 DIAGNOSIS — J449 Chronic obstructive pulmonary disease, unspecified: Secondary | ICD-10-CM | POA: Insufficient documentation

## 2018-10-16 DIAGNOSIS — E079 Disorder of thyroid, unspecified: Secondary | ICD-10-CM | POA: Diagnosis not present

## 2018-10-16 DIAGNOSIS — M5136 Other intervertebral disc degeneration, lumbar region: Secondary | ICD-10-CM

## 2018-10-16 DIAGNOSIS — Z9981 Dependence on supplemental oxygen: Secondary | ICD-10-CM | POA: Diagnosis not present

## 2018-10-16 DIAGNOSIS — M961 Postlaminectomy syndrome, not elsewhere classified: Secondary | ICD-10-CM | POA: Insufficient documentation

## 2018-10-16 DIAGNOSIS — M2170 Unequal limb length (acquired), unspecified site: Secondary | ICD-10-CM | POA: Diagnosis not present

## 2018-10-16 DIAGNOSIS — IMO0002 Reserved for concepts with insufficient information to code with codable children: Secondary | ICD-10-CM

## 2018-10-16 DIAGNOSIS — M5126 Other intervertebral disc displacement, lumbar region: Secondary | ICD-10-CM | POA: Diagnosis not present

## 2018-10-16 DIAGNOSIS — M4316 Spondylolisthesis, lumbar region: Secondary | ICD-10-CM

## 2018-10-16 DIAGNOSIS — E785 Hyperlipidemia, unspecified: Secondary | ICD-10-CM | POA: Diagnosis not present

## 2018-10-16 DIAGNOSIS — G2581 Restless legs syndrome: Secondary | ICD-10-CM | POA: Insufficient documentation

## 2018-10-16 DIAGNOSIS — F411 Generalized anxiety disorder: Secondary | ICD-10-CM

## 2018-10-16 DIAGNOSIS — Z79899 Other long term (current) drug therapy: Secondary | ICD-10-CM | POA: Insufficient documentation

## 2018-10-16 DIAGNOSIS — Z79891 Long term (current) use of opiate analgesic: Secondary | ICD-10-CM

## 2018-10-16 MED ORDER — FENTANYL 50 MCG/HR TD PT72: 1.0000 | MEDICATED_PATCH | TRANSDERMAL | 0 refills | Status: DC

## 2018-10-16 MED ORDER — METHYLPREDNISOLONE ACETATE 40 MG/ML IJ SUSP
20.0000 mg | INTRAMUSCULAR | Status: AC | PRN
  Administered 2018-10-08: 20 mg

## 2018-10-16 MED ORDER — HYDROCODONE-ACETAMINOPHEN 10-325 MG PO TABS: 1.0000 | ORAL_TABLET | Freq: Four times a day (QID) | ORAL | 0 refills | Status: DC | PRN

## 2018-10-16 MED ORDER — BUPIVACAINE HCL 0.25 % IJ SOLN
4.0000 mL | INTRAMUSCULAR | Status: AC | PRN
  Administered 2018-10-08: 4 mL via INTRA_ARTICULAR

## 2018-10-16 MED ORDER — GABAPENTIN 300 MG PO CAPS: 300.0000 mg | ORAL_CAPSULE | Freq: Every day | ORAL | 5 refills | Status: DC

## 2018-10-16 MED ORDER — METHYLPREDNISOLONE ACETATE 40 MG/ML IJ SUSP
40.0000 mg | INTRAMUSCULAR | Status: AC | PRN
  Administered 2018-10-08: 40 mg via INTRA_ARTICULAR

## 2018-10-16 NOTE — Progress Notes (Signed)
Subjective:    Patient ID: Jill Phillips, female    DOB: 08-31-1956, 63 y.o.   MRN: [id number]  HPI: Jill Phillips is a 63 y.o. female who returns for follow up appointment for chronic pain and medication refill. She states her pain is located in her lower back. She rates her pain 5. Her current exercise regime is walking.  Jill Phillips Morphine equivalent is  160.00   MME. She is also prescribed Clonazepam .We have discussed the black box warning of using opioids and benzodiazepines. I highlighted the dangers of using these drugs together and discussed the adverse events including respiratory suppression, overdose, cognitive impairment and importance of compliance with current regimen. We will continue to monitor and adjust as indicated.   Last Oral Swab was Performed on 02/27/2018, it was consistent.     Pain Inventory Average Pain 5 Pain Right Now 5 My pain is intermittent  In the last 24 hours, has pain interfered with the following? General activity 5 Relation with others 1 Enjoyment of life 4 What TIME of day is your pain at its worst? evening Sleep (in general) Fair  Pain is worse with: walking, standing and some activites Pain improves with: rest and medication Relief from Meds: 7  Mobility Do you have any goals in this area?  no  Function Do you have any goals in this area?  no  Neuro/Psych No problems in this area  Prior Studies Any changes since last visit?  yes  Physicians involved in your care Any changes since last visit?  no   Family History  Problem Relation Age of Onset  . Kidney disease Mother   . Heart disease Father   . Anuerysm Brother 29       brain  . Heart disease Brother   . Heart disease Sister 45       s/p CABG  . Hypertension Sister   . Colon cancer Neg Hx    Social History   Socioeconomic History  . Marital status: Divorced    Spouse name: n/a  . Number of children: 2  . Years of education: 12+  . Highest education level:  Not on file  Occupational History  . Occupation: disability    Comment: back surgeries  Social Needs  . Financial resource strain: Not on file  . Food insecurity:    Worry: Not on file    Inability: Not on file  . Transportation needs:    Medical: Not on file    Non-medical: Not on file  Tobacco Use  . Smoking status: Current Every Day Smoker    Packs/day: 1.25    Years: 45.00    Pack years: 56.25    Types: Cigarettes  . Smokeless tobacco: Never Used  . Tobacco comment: form given 12/25/16  Substance and Sexual Activity  . Alcohol use: No    Alcohol/week: 0.0 standard drinks  . Drug use: No    Types: Marijuana    Comment: 01/31/2016 "none since ~ 1980"  . Sexual activity: Never    Partners: Male  Lifestyle  . Physical activity:    Days per week: Not on file    Minutes per session: Not on file  . Stress: Not on file  Relationships  . Social connections:    Talks on phone: Not on file    Gets together: Not on file    Attends religious service: Not on file    Active member of club or organization: Not on  file    Attends meetings of clubs or organizations: Not on file    Relationship status: Not on file  Other Topics Concern  . Not on file  Social History Narrative   Lives alone.  One daughter is local, but is getting ready to move to [place], where her children live with their father.  The other daughter lives near [place], [place].   Past Surgical History:  Procedure Laterality Date  . APPENDECTOMY    . BACK SURGERY     18 back surgeries (2 thoracic & 16 lumbar) (01/31/2016)  . DILATION AND CURETTAGE OF UTERUS    . HAMMER TOE SURGERY    . IR RADIOLOGIST EVAL & MGMT  07/21/2017  . JOINT REPLACEMENT    . KNEE ARTHROSCOPY Right   . LAPAROSCOPIC CHOLECYSTECTOMY    . LUMBAR FUSION N/A 08/01/2015   Procedure: Right sided L1-2 and L2-3 transforaminal lumbar interbody fusion with cages, Extension of posterior fusion T12 to L3, Replaced pedicle screws bilaterally L1-L2 ,  Replaced left sided pedicle screws L-3. Instrumentation T12 to L3 using local bone graft, Vivigen allograft and cancellous chips;  Surgeon: [name], MD;  Location: [place];  Service: Orthopedics;  Laterality: N/A;  . LUMBAR LAMINECTOMY/DECOMPRESSION MICRODISCECTOMY N/A 01/28/2014   Procedure: Minimally Invasive Right  L1-2 Microdiscectomy;  Surgeon: [name], MD;  Location: [facility];  Service: Orthopedics;  Laterality: N/A;  . TOTAL HIP ARTHROPLASTY Right   . TOTAL KNEE ARTHROPLASTY  05/29/2012   Procedure: TOTAL KNEE ARTHROPLASTY;  Surgeon: Mcarthur Rossetti, MD;  [facility];  Service: Orthopedics;  Laterality: Right;  Right Total Knee Arthroplasty  . TUBAL LIGATION    . UVULOPALATOPHARYNGOPLASTY    . VAGINAL HYSTERECTOMY     Past Medical History:  Diagnosis Date  . Active smoker   . Anxiety   . Calcifying tendinitis of shoulder   . Chronic back pain   . Chronic pain syndrome   . COPD (chronic obstructive pulmonary disease) ([facility])   . Dysthymic disorder   . Emphysema lung ([facility])   . GERD (gastroesophageal reflux disease)   . Headache    "weekly maybe" (01/31/2016)  . Heart murmur    for years, nothing to be concerned about  . Herpes genitalia   . History of blood transfusion 1980   related to "back surgery"  . Hyperlipidemia   . Hypertension   . Hypothyroidism   . Lumbago   . Osteoarthrosis, unspecified whether generalized or localized, lower leg   . Pain in joint, upper arm   . Pneumothorax, left 01/31/2016   S/P Left posterior subcostal pain injection on 01/30/2016  . PONV (postoperative nausea and vomiting)    gets nauseous  with longer surgery. Difficuty voiding after surgery  . Postlaminectomy syndrome, thoracic region   . Primary localized osteoarthrosis, lower leg   . Restless leg syndrome   . Sleep apnea    s/p surgery- last sleep study 2011- doesnt use oxygen or machine at night as instructed,.   12/2014- Dr Halford Chessman  reports it is negative.  . Thyroid  disease    BP 116/70   Pulse 91   Ht 5\' 3"  (1.6 m)   Wt 149 lb 6.4 oz (67.8 kg)   SpO2 93%   BMI 26.47 kg/m   Opioid Risk Score:   Fall Risk Score:  `1  Depression screen PHQ 2/9  Depression screen [facility] 2/9 10/16/2018 09/16/2018 06/22/2018 01/01/2018 12/01/2017 10/10/2017 09/10/2017  Decreased Interest 0 0 0  0 0 1 1  Down, Depressed, Hopeless 0 0 0 0 0 1 1  PHQ - 2 Score 0 0 0 0 0 2 2  Some recent data might be hidden    Review of Systems  Constitutional: Negative.   HENT: Negative.   Eyes: Negative.   Respiratory: Negative.   Cardiovascular: Negative.   Gastrointestinal: Negative.   Endocrine: Negative.   Genitourinary: Negative.   Musculoskeletal: Negative.   Skin: Negative.   Allergic/Immunologic: Negative.   Neurological: Negative.   Hematological: Negative.   Psychiatric/Behavioral: Negative.   All other systems reviewed and are negative.      Objective:   Physical Exam Vitals signs and nursing note reviewed.  Constitutional:      Appearance: Normal appearance.  Neck:     Musculoskeletal: Normal range of motion and neck supple.  Cardiovascular:     Rate and Rhythm: Normal rate and regular rhythm.     Pulses: Normal pulses.     Heart sounds: Normal heart sounds.  Pulmonary:     Effort: Pulmonary effort is normal.     Breath sounds: Normal breath sounds.  Musculoskeletal:     Comments: Normal Muscle Bulk and Muscle Testing Reveals:  Upper Extremities: Full ROM and Muscle Strength 5/5 Lumbar Paraspinal Tenderness: L-4-L-5 Lower Extremities: Full ROM and Muscle Strength 5/5 Arises from chair Slowly Narrow Based  Gait   Skin:    General: Skin is warm and dry.  Neurological:     Mental Status: She is alert and oriented to person, place, and time.  Psychiatric:        Mood and Affect: Mood normal.        Behavior: Behavior normal.           Assessment & Plan:  1. Lumbar Degenerative Disc/Chronic lumbar spine pain/post-lami syndrome:10/16/2018 Continue  Fentanyl 50 MCG one patch every three days #10 andHydrocodone10/325 mg one tablet every 6 hours as needed for pain #120.We will continue the opioid monitoring program, this consists of regular clinic visits, examinations, urine drug screen, pill counts as well as use of [place] Controlled Substance Reporting System. 2. Chronic Thoracic Pain:No complaints Today.Continue current medication regime and Continue to Monitor.10/16/2018 3. Osteoarthritis of left knee:C No complaints today.Continue with Heat, exercise and voltaren.10/16/2018 4. Rotator cuff syndrome/Leftsubacromial bursitis:Dr. Flavia Shipper Following. 10/16/2018 5. Restless legs syndrome:Continue with current treatment withRequip.Continue to monitor.10/16/2018 6. Tobacco Abuse:Encourage and Educated onSmoking Cessation: PCP prescribed Nicotine Patches : 10/16/2018 7. Muscle Spasm:Continue current treatment Regimen withTizanidine.10/16/2018 8. Insomnia:Continue current treatment regimen withSeroquel. 10/16/2018. 9. Neuropathic Pain:Continue current treatment regimen withGabapentin.10/16/2018 10.Anxiety:Continue current treatment regimen withKlonopin. 10/16/2018 11 Chronic Rectal Pain: No complaints today.Continue Pelvic Floor Therapy.Dr. [name] Following.10/16/2018.  20 minutes of face to face patient care time was spent during this visit. All questions were encouraged and answered.  F/u in 1 month

## 2018-10-21 ENCOUNTER — Other Ambulatory Visit: Payer: Self-pay | Admitting: Registered Nurse

## 2018-10-23 ENCOUNTER — Other Ambulatory Visit: Payer: Self-pay | Admitting: Registered Nurse

## 2018-10-27 ENCOUNTER — Ambulatory Visit: Payer: Medicare Other | Admitting: Internal Medicine

## 2018-10-28 ENCOUNTER — Ambulatory Visit (INDEPENDENT_AMBULATORY_CARE_PROVIDER_SITE_OTHER): Payer: Medicare Other | Admitting: Orthopedic Surgery

## 2018-10-28 ENCOUNTER — Encounter (INDEPENDENT_AMBULATORY_CARE_PROVIDER_SITE_OTHER): Payer: Self-pay | Admitting: Orthopedic Surgery

## 2018-10-28 DIAGNOSIS — M75122 Complete rotator cuff tear or rupture of left shoulder, not specified as traumatic: Secondary | ICD-10-CM

## 2018-10-29 ENCOUNTER — Encounter (INDEPENDENT_AMBULATORY_CARE_PROVIDER_SITE_OTHER): Payer: Self-pay | Admitting: Orthopedic Surgery

## 2018-10-29 NOTE — Progress Notes (Signed)
Office Visit Note   Patient: Jill Phillips           Date of Birth: 1956/03/03           MRN: [id number] Visit Date: 10/28/2018 Requested by: [name], MD [place], [place] [address] PCP: [name], MD  Subjective: Chief Complaint  Patient presents with  . Left Shoulder - Pain    HPI: [name] is a patient with left shoulder pain.  She has had 2 injections in the shoulder the last one in February.  Patient reports primarily pain but reasonably good function.  It hurts in different spots when she tries to move the shoulder.  She does have a history of total hip replacement and total knee replacement.  She is on Norco 06/04/2024 4 tablets/day as well as a fentanyl patch 50 mg 1 every 3 days.  She is in pain management.              ROS: All systems reviewed are negative as they relate to the chief complaint within the history of present illness.  Patient denies  fevers or chills.   Assessment & Plan: Visit Diagnoses:  1. Nontraumatic complete tear of left rotator cuff     Plan: Impression is left shoulder rotator cuff arthropathy with surprisingly good function.  She has forward flexion of about 160 and abduction at 90.  She does have pain but for now it would be difficult to improve her function with a reverse replacement.  Patient has decided that she wants to wait this out a little bit longer.  I think eventually she will get less functional.  She will talk to her pain management doctors as well about how to increase her pain medicine to help with the pain which will be associated with shoulder replacement.  I will see her back as needed  Follow-Up Instructions: Return if symptoms worsen or fail to improve.   Orders:  Orders Placed This Encounter  Procedures  . CT SHOULDER LEFT WO CONTRAST   No orders of the defined types were placed in this encounter.     Procedures: No procedures performed   Clinical Data: No additional findings.  Objective: Vital  Signs: There were no vitals taken for this visit.  Physical Exam:   Constitutional: Patient appears well-developed HEENT:  Head: Normocephalic Eyes:EOM are normal Neck: Normal range of motion Cardiovascular: Normal rate Pulmonary/chest: Effort normal Neurologic: Patient is alert Skin: Skin is warm Psychiatric: Patient has normal mood and affect    Ortho Exam: Ortho exam demonstrates pretty reasonable cervical spine range of motion.  Patient has 5 out of 5 grip EPL FPL interosseous wrist flexion extension bicep triceps and deltoid strength.  She actually has forward flexion of about 160 and abduction of 90.  Does have rotator cuff weakness on the left-hand side compared to the right.  This is primarily in the supraspinatus and infraspinatus.  Subscap strength is fairly well-maintained.  Coarse grinding and crepitus is present with passive range of motion of that shoulder.  Specialty Comments:  No specialty comments available.  Imaging: No results found.   PMFS History: Patient Active Problem List   Diagnosis Date Noted  . Disorder of rotator cuff syndrome of left shoulder and allied disorder 06/22/2018  . Leg edema, left 06/04/2018  . Type 2 diabetes mellitus without complication, without long-term current use of insulin ([place]) 12/02/2017  . Sacral pain 09/17/2017  . COPD (chronic obstructive pulmonary disease) (  [place]) 04/21/2017  . Lower back pain 03/03/2017  . CKD (chronic kidney disease) stage 3, GFR 30-59 ml/min (HCC) 08/07/2016  . GERD (gastroesophageal reflux disease) 08/07/2016  . Chronic respiratory failure ([place]) 07/29/2016  . Hypersomnia 07/29/2016  . Arthritis of carpometacarpal [facility]) joint of left thumb 07/09/2016  . Pneumothorax on left 01/31/2016  . Chronic, continuous use of opioids 09/06/2015  . Degenerative disc disease, lumbar 08/01/2015    Class: Chronic  . Spondylolisthesis of lumbar region 08/01/2015    Class: Chronic  . Urinary, incontinence, stress  female 05/06/2014  . Constipation 05/05/2014  . 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 HTN (hypertension) 05/19/2013  . HLD (hyperlipidemia) 05/19/2013  . Depression 05/19/2013  . Hypothyroidism 05/19/2013  . Tobacco abuse   . Osteoarthritis of both knees 11/18/2011  . RLS (restless legs syndrome) 11/18/2011   Past Medical History:  Diagnosis Date  . Active smoker   . Anxiety   . Calcifying tendinitis of shoulder   . Chronic back pain   . Chronic pain syndrome   . COPD (chronic obstructive pulmonary disease) ([place])   . Dysthymic disorder   . Emphysema lung ([place])   . GERD (gastroesophageal reflux disease)   . Headache    "weekly maybe" (01/31/2016)  . Heart murmur    for years, nothing to be concerned about  . Herpes genitalia   . History of blood transfusion 1980   related to "back surgery"  . Hyperlipidemia   . Hypertension   . Hypothyroidism   . Lumbago   . Osteoarthrosis, unspecified whether generalized or localized, lower leg   . Pain in joint, upper arm   . Pneumothorax, left 01/31/2016   S/P Left posterior subcostal pain injection on 01/30/2016  . PONV (postoperative nausea and vomiting)    gets nauseous  with longer surgery. Difficuty voiding after surgery  . Postlaminectomy syndrome, thoracic region   . Primary localized osteoarthrosis, lower leg   . Restless leg syndrome   . Sleep apnea    s/p surgery- last sleep study 2011- doesnt use oxygen or machine at night as instructed,.   12/2014- Dr Halford Chessman  reports it is negative.  . Thyroid disease     Family History  Problem Relation Age of Onset  . Kidney disease Mother   . Heart disease Father   . Anuerysm Brother 29       brain  . Heart disease Brother   . Heart disease Sister 31       s/p CABG  . Hypertension Sister   . Colon cancer Neg Hx     Past Surgical History:  Procedure Laterality Date  . APPENDECTOMY    . BACK SURGERY     18 back surgeries (2 thoracic & 16 lumbar) (01/31/2016)  . DILATION AND CURETTAGE OF  UTERUS    . HAMMER TOE SURGERY    . IR RADIOLOGIST EVAL & MGMT  07/21/2017  . JOINT REPLACEMENT    . KNEE ARTHROSCOPY Right   . LAPAROSCOPIC CHOLECYSTECTOMY    . LUMBAR FUSION N/A 08/01/2015   Procedure: Right sided L1-2 and L2-3 transforaminal lumbar interbody fusion with cages, Extension of posterior fusion T12 to L3, Replaced pedicle screws bilaterally L1-L2 , Replaced left sided pedicle screws L-3. Instrumentation T12 to L3 using local bone graft, Vivigen allograft and cancellous chips;  Surgeon: [name], MD;  Location: [place];  Service: Orthopedics;  Laterality: N/A;  . LUMBAR LAMINECTOMY/DECOMPRESSION MICRODISCECTOMY N/A 01/28/2014   Procedure: Minimally Invasive Right  L1-2  Microdiscectomy;  Surgeon: [name], MD;  Location: [place];  Service: Orthopedics;  Laterality: N/A;  . TOTAL HIP ARTHROPLASTY Right   . TOTAL KNEE ARTHROPLASTY  05/29/2012   Procedure: TOTAL KNEE ARTHROPLASTY;  Surgeon: Mcarthur Rossetti, MD;  Location: [facility];  Service: Orthopedics;  Laterality: Right;  Right Total Knee Arthroplasty  . TUBAL LIGATION    . UVULOPALATOPHARYNGOPLASTY    . VAGINAL HYSTERECTOMY     Social History   Occupational History  . Occupation: disability    Comment: back surgeries  Tobacco Use  . Smoking status: Current Every Day Smoker    Packs/day: 1.25    Years: 45.00    Pack years: 56.25    Types: Cigarettes  . Smokeless tobacco: Never Used  . Tobacco comment: form given 12/25/16  Substance and Sexual Activity  . Alcohol use: No    Alcohol/week: 0.0 standard drinks  . Drug use: No    Types: Marijuana    Comment: 01/31/2016 "none since ~ 1980"  . Sexual activity: Never    Partners: Male

## 2018-11-02 ENCOUNTER — Other Ambulatory Visit: Payer: Self-pay | Admitting: Physical Medicine & Rehabilitation

## 2018-11-03 ENCOUNTER — Other Ambulatory Visit (INDEPENDENT_AMBULATORY_CARE_PROVIDER_SITE_OTHER): Payer: Medicare Other

## 2018-11-03 ENCOUNTER — Ambulatory Visit (INDEPENDENT_AMBULATORY_CARE_PROVIDER_SITE_OTHER): Payer: Medicare Other | Admitting: Internal Medicine

## 2018-11-03 ENCOUNTER — Encounter: Payer: Self-pay | Admitting: Internal Medicine

## 2018-11-03 VITALS — BP 108/64 | HR 80 | Temp 98.5°F | Resp 16 | Ht 63.0 in | Wt 147.0 lb

## 2018-11-03 DIAGNOSIS — I10 Essential (primary) hypertension: Secondary | ICD-10-CM

## 2018-11-03 DIAGNOSIS — N183 Chronic kidney disease, stage 3 unspecified: Secondary | ICD-10-CM

## 2018-11-03 DIAGNOSIS — F32A Depression, unspecified: Secondary | ICD-10-CM

## 2018-11-03 DIAGNOSIS — E119 Type 2 diabetes mellitus without complications: Secondary | ICD-10-CM

## 2018-11-03 DIAGNOSIS — J209 Acute bronchitis, unspecified: Secondary | ICD-10-CM

## 2018-11-03 DIAGNOSIS — K219 Gastro-esophageal reflux disease without esophagitis: Secondary | ICD-10-CM | POA: Diagnosis not present

## 2018-11-03 DIAGNOSIS — E038 Other specified hypothyroidism: Secondary | ICD-10-CM

## 2018-11-03 DIAGNOSIS — E7849 Other hyperlipidemia: Secondary | ICD-10-CM

## 2018-11-03 DIAGNOSIS — F329 Major depressive disorder, single episode, unspecified: Secondary | ICD-10-CM

## 2018-11-03 LAB — CBC WITH DIFFERENTIAL/PLATELET
Basophils Absolute: 0 10*3/uL (ref 0.0–0.1)
Basophils Relative: 0.6 % (ref 0.0–3.0)
Eosinophils Absolute: 0.2 10*3/uL (ref 0.0–0.7)
Eosinophils Relative: 2 % (ref 0.0–5.0)
HCT: 39.8 % (ref 36.0–46.0)
Hemoglobin: 13.2 g/dL (ref 12.0–15.0)
Lymphocytes Relative: 34.8 % (ref 12.0–46.0)
Lymphs Abs: 2.9 10*3/uL (ref 0.7–4.0)
MCHC: 33.3 g/dL (ref 30.0–36.0)
MCV: 91.6 fl (ref 78.0–100.0)
Monocytes Absolute: 0.5 10*3/uL (ref 0.1–1.0)
Monocytes Relative: 6.1 % (ref 3.0–12.0)
Neutro Abs: 4.7 10*3/uL (ref 1.4–7.7)
Neutrophils Relative %: 56.5 % (ref 43.0–77.0)
Platelets: 224 10*3/uL (ref 150.0–400.0)
RBC: 4.35 Mil/uL (ref 3.87–5.11)
RDW: 15.2 % (ref 11.5–15.5)
WBC: 8.3 10*3/uL (ref 4.0–10.5)

## 2018-11-03 LAB — COMPREHENSIVE METABOLIC PANEL
ALT: 8 U/L (ref 0–35)
AST: 12 U/L (ref 0–37)
Albumin: 4.4 g/dL (ref 3.5–5.2)
Alkaline Phosphatase: 170 U/L — ABNORMAL HIGH (ref 39–117)
BUN: 15 mg/dL (ref 6–23)
CO2: 31 mEq/L (ref 19–32)
Calcium: 9.3 mg/dL (ref 8.4–10.5)
Chloride: 104 mEq/L (ref 96–112)
Creatinine, Ser: 1.09 mg/dL (ref 0.40–1.20)
GFR: 50.58 mL/min — ABNORMAL LOW (ref >60.00)
Glucose, Bld: 88 mg/dL (ref 70–99)
Potassium: 3.9 mEq/L (ref 3.5–5.1)
Sodium: 144 mEq/L (ref 135–145)
Total Bilirubin: 0.4 mg/dL (ref 0.2–1.2)
Total Protein: 7.1 g/dL (ref 6.0–8.3)

## 2018-11-03 LAB — HEMOGLOBIN A1C: Hgb A1c MFr Bld: 6.3 % (ref 4.6–6.5)

## 2018-11-03 LAB — TSH: TSH: 1.1 u[IU]/mL (ref 0.35–4.50)

## 2018-11-03 LAB — LIPID PANEL
Cholesterol: 166 mg/dL (ref 0–200)
HDL: 47.6 mg/dL (ref >39.00)
LDL Cholesterol: 86 mg/dL (ref 0–99)
NonHDL: 117.95
Total CHOL/HDL Ratio: 3
Triglycerides: 158 mg/dL — ABNORMAL HIGH (ref 0.0–149.0)
VLDL: 31.6 mg/dL (ref 0.0–40.0)

## 2018-11-03 MED ORDER — DOXYCYCLINE HYCLATE 100 MG PO TABS: 100.0000 mg | ORAL_TABLET | Freq: Two times a day (BID) | ORAL | 0 refills | Status: DC

## 2018-11-03 MED ORDER — BENAZEPRIL HCL 40 MG PO TABS: 40.0000 mg | ORAL_TABLET | Freq: Every day | ORAL | 1 refills | Status: DC

## 2018-11-03 NOTE — Assessment & Plan Note (Signed)
BP well controlled Current regimen effective and well tolerated Continue current medications at current doses cmp  

## 2018-11-03 NOTE — Assessment & Plan Note (Signed)
Clinically euthyroid Check tsh  Titrate med dose if needed  

## 2018-11-03 NOTE — Assessment & Plan Note (Addendum)
She stopped the diclofenac and hctz Kidney function has improved when it was checked last and hopefully will continue to improve Stressed smoking cessation Increase water intake - does not drink much cmp

## 2018-11-03 NOTE — Assessment & Plan Note (Signed)
Controlled, stable Continue current dose of medication  

## 2018-11-03 NOTE — Progress Notes (Signed)
Subjective:    Patient ID: Jill Phillips, female    DOB: Jan 19, 1956, 63 y.o.   MRN: [id number]  HPI The patient is here for follow up.  Hypertension: She is taking her medication daily. She is not compliant with a low sodium diet.  She denies chest pain, palpitations, edema. She is not exercising regularly.  She does not monitor her blood pressure at home.    Hyperlipidemia: She is taking her medication daily. She is compliant with a low fat/cholesterol diet. She is not exercising regularly. She denies myalgias.   Diabetes: She is taking her medication daily as prescribed. She is not compliant with a diabetic diet. She is not exercising regularly.  She checks her feet daily and denies foot lesions. She is up-to-date with an ophthalmology examination.   Hypothyroidism:  She is taking her medication daily.  She denies any recent changes in energy or weight that are unexplained.   GERD:  She is taking her medication daily as prescribed.  She denies any GERD symptoms and feels her GERD is well controlled.   Tobacco abuse: She is still smoking.  Chronic kidney disease: She is worried about her kidneys.  She has stopped the diclofenac both oral and topical.  She also stopped hydrochlorothiazide.  She denies any leg edema.  Depression: She is taking her medication daily as prescribed. She denies any side effects from the medication. She feels her depression is well controlled and she is happy with her current dose of medication.   Cough: She has been coughing for about 3 weeks now.  The cough is very wet but she is not able to get any sputum up.  She does have wheezing and shortness of breath.  She is still smoking.  She denies any fevers or other cold symptoms.  Medications and allergies reviewed with patient and updated if appropriate.  Patient Active Problem List   Diagnosis Date Noted  . Disorder of rotator cuff syndrome of left shoulder and allied disorder 06/22/2018  . Leg edema,  left 06/04/2018  . Type 2 diabetes mellitus without complication, without long-term current use of insulin ([place]) 12/02/2017  . Sacral pain 09/17/2017  . COPD (chronic obstructive pulmonary disease) ([place]) 04/21/2017  . Lower back pain 03/03/2017  . CKD (chronic kidney disease) stage 3, GFR 30-59 ml/min (HCC) 08/07/2016  . GERD (gastroesophageal reflux disease) 08/07/2016  . Chronic respiratory failure ([place]) 07/29/2016  . Hypersomnia 07/29/2016  . Arthritis of carpometacarpal [facility]) joint of left thumb 07/09/2016  . Pneumothorax on left 01/31/2016  . Chronic, continuous use of opioids 09/06/2015  . Degenerative disc disease, lumbar 08/01/2015    Class: Chronic  . Spondylolisthesis of lumbar region 08/01/2015    Class: Chronic  . Urinary, incontinence, stress female 05/06/2014  . Constipation 05/05/2014  . 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 HTN (hypertension) 05/19/2013  . HLD (hyperlipidemia) 05/19/2013  . Depression 05/19/2013  . Hypothyroidism 05/19/2013  . Tobacco abuse   . Osteoarthritis of both knees 11/18/2011  . RLS (restless legs syndrome) 11/18/2011    Current Outpatient Medications on File Prior to Visit  Medication Sig Dispense Refill  . albuterol (PROAIR HFA) 108 (90 Base) MCG/ACT inhaler INHALE 1 PUFF INTO THE LUNGS EVERY 6 HOURS AS NEEDED FOR WHEEZING OR SHORTNESS OF BREATH.NEED OFFICE VISIT FOR FURTHER REFILLS 8.5 g 0  . amLODipine (NORVASC) 10 MG tablet TAKE 1 TABLET BY MOUTH DAILY. 90 tablet 0  . aspirin-acetaminophen-caffeine (EXCEDRIN MIGRAINE) 250-250-65 MG tablet Take 1 tablet  by mouth every 6 (six) hours as needed for headache.    [name] atorvastatin (LIPITOR) 20 MG tablet TAKE 1 TABLET BY MOUTH DAILY. 90 tablet 1  . Blood Glucose Monitoring Suppl (ONETOUCH VERIO FLEX SYSTEM) w/Device KIT USE TO TEST BLOOD SUGAR UP TO 4 TIMES A DAY 1 kit 0  . clonazePAM (KLONOPIN) 2 MG tablet TAKE 1 TABLET BY MOUTH 2 TIMES DAILY AS NEEDED FOR ANXIETY. 60 tablet 2  . diphenhydrAMINE  (BENADRYL) 25 MG tablet Take 25-50 mg by mouth every 6 (six) hours as needed (bladder).    . fentaNYL (DURAGESIC) 50 MCG/HR Place 1 patch onto the skin every 3 (three) days. 10 patch 0  . gabapentin (NEURONTIN) 300 MG capsule Take 1 capsule (300 mg total) by mouth 5 (five) times daily. 150 capsule 5  . HYDROcodone-acetaminophen (NORCO) 10-325 MG tablet Take 1 tablet by mouth every 6 (six) hours as needed. 120 tablet 0  . levothyroxine (SYNTHROID, LEVOTHROID) 88 MCG tablet TAKE 1 TABLET BY MOUTH DAILY. 90 tablet 1  . Meth-Hyo-M Bl-Na Phos-Ph Sal (URIBEL) 118 MG CAPS Take by mouth 3 (three) times daily.     [name] omeprazole (PRILOSEC) 40 MG capsule TAKE 1 CAPSULE BY MOUTH 2 TIMES DAILY BEFORE A MEAL. 180 capsule 3  . PROAIR HFA 108 (90 Base) MCG/ACT inhaler INHALE 2 PUFFS INTO THE LUNGS EVERY 6 HOURS AS NEEDED FOR WHEEZING OR SHORTNESS OF BREATH. 8 g 0  . QUEtiapine (SEROQUEL) 100 MG tablet TAKE 1 TABLET BY MOUTH AT BEDTIME. 30 tablet 2  . rOPINIRole (REQUIP) 0.5 MG tablet TAKE 2 TABLETS BY MOUTH AT BEDTIME. 60 tablet 2  . Sennosides (SENNA LAX PO) Take 1 tablet by mouth daily. As directed    . SPIRIVA HANDIHALER 18 MCG inhalation capsule PLACE 1 CAPSULE INTO INHALER AND INHALE ONCE DAILY. 30 capsule 0  . valACYclovir (VALTREX) 500 MG tablet TAKE 1 TABLET BY MOUTH DAILY. 30 tablet 5  . venlafaxine XR (EFFEXOR-XR) 150 MG 24 hr capsule TAKE 2 CAPSULES BY MOUTH DAILY. 60 capsule 3  . Vitamin D, Ergocalciferol, (DRISDOL) 50000 units CAPS capsule Take 1 capsule (50,000 Units total) by mouth every 7 (seven) days. 8 capsule 0   Current Facility-Administered Medications on File Prior to Visit  Medication Dose Route Frequency Provider Last Rate Last Dose  . calcitonin (salmon) (MIACALCIN/FORTICAL) nasal spray 1 spray  1 spray Alternating Nares Daily [name], MD        Past Medical History:  Diagnosis Date  . Active smoker   . Anxiety   . Calcifying tendinitis of shoulder   . Chronic back pain   .  Chronic pain syndrome   . COPD (chronic obstructive pulmonary disease) ([place])   . Dysthymic disorder   . Emphysema lung ([place])   . GERD (gastroesophageal reflux disease)   . Headache    "weekly maybe" (01/31/2016)  . Heart murmur    for years, nothing to be concerned about  . Herpes genitalia   . History of blood transfusion 1980   related to "back surgery"  . Hyperlipidemia   . Hypertension   . Hypothyroidism   . Lumbago   . Osteoarthrosis, unspecified whether generalized or localized, lower leg   . Pain in joint, upper arm   . Pneumothorax, left 01/31/2016   S/P Left posterior subcostal pain injection on 01/30/2016  . PONV (postoperative nausea and vomiting)    gets nauseous  with longer surgery. Difficuty voiding after surgery  . Postlaminectomy syndrome,  thoracic region   . Primary localized osteoarthrosis, lower leg   . Restless leg syndrome   . Sleep apnea    s/p surgery- last sleep study 2011- doesnt use oxygen or machine at night as instructed,.   12/2014- Dr Halford Chessman  reports it is negative.  . Thyroid disease     Past Surgical History:  Procedure Laterality Date  . APPENDECTOMY    . BACK SURGERY     18 back surgeries (2 thoracic & 16 lumbar) (01/31/2016)  . DILATION AND CURETTAGE OF UTERUS    . HAMMER TOE SURGERY    . IR RADIOLOGIST EVAL & MGMT  07/21/2017  . JOINT REPLACEMENT    . KNEE ARTHROSCOPY Right   . LAPAROSCOPIC CHOLECYSTECTOMY    . LUMBAR FUSION N/A 08/01/2015   Procedure: Right sided L1-2 and L2-3 transforaminal lumbar interbody fusion with cages, Extension of posterior fusion T12 to L3, Replaced pedicle screws bilaterally L1-L2 , Replaced left sided pedicle screws L-3. Instrumentation T12 to L3 using local bone graft, Vivigen allograft and cancellous chips;  Surgeon: [name], MD;  Location: [facility];  Service: Orthopedics;  Laterality: N/A;  . LUMBAR LAMINECTOMY/DECOMPRESSION MICRODISCECTOMY N/A 01/28/2014   Procedure: Minimally Invasive Right  L1-2  Microdiscectomy;  Surgeon: [name], MD;  Location: [place];  Service: Orthopedics;  Laterality: N/A;  . TOTAL HIP ARTHROPLASTY Right   . TOTAL KNEE ARTHROPLASTY  05/29/2012   Procedure: TOTAL KNEE ARTHROPLASTY;  Surgeon: Mcarthur Rossetti, MD;  Location: [facility];  Service: Orthopedics;  Laterality: Right;  Right Total Knee Arthroplasty  . TUBAL LIGATION    . UVULOPALATOPHARYNGOPLASTY    . VAGINAL HYSTERECTOMY      Social History   Socioeconomic History  . Marital status: Divorced    Spouse name: n/a  . Number of children: 2  . Years of education: 12+  . Highest education level: Not on file  Occupational History  . Occupation: disability    Comment: back surgeries  Social Needs  . Financial resource strain: Not on file  . Food insecurity:    Worry: Not on file    Inability: Not on file  . Transportation needs:    Medical: Not on file    Non-medical: Not on file  Tobacco Use  . Smoking status: Current Every Day Smoker    Packs/day: 1.25    Years: 45.00    Pack years: 56.25    Types: Cigarettes  . Smokeless tobacco: Never Used  . Tobacco comment: form given 12/25/16  Substance and Sexual Activity  . Alcohol use: No    Alcohol/week: 0.0 standard drinks  . Drug use: No    Types: Marijuana    Comment: 01/31/2016 "none since ~ 1980"  . Sexual activity: Never    Partners: Male  Lifestyle  . Physical activity:    Days per week: Not on file    Minutes per session: Not on file  . Stress: Not on file  Relationships  . Social connections:    Talks on phone: Not on file    Gets together: Not on file    Attends religious service: Not on file    Active member of club or organization: Not on file    Attends meetings of clubs or organizations: Not on file    Relationship status: Not on file  Other Topics Concern  . Not on file  Social History Narrative   Lives alone.  One daughter is local, but is getting ready to  move to [place], where her children live with their  father.  The other daughter lives near [place], [place].    Family History  Problem Relation Age of Onset  . Kidney disease Mother   . Heart disease Father   . Anuerysm Brother 29       brain  . Heart disease Brother   . Heart disease Sister 14       s/p CABG  . Hypertension Sister   . Colon cancer Neg Hx     Review of Systems  Constitutional: Negative for chills and fever.  HENT: Negative for congestion, ear pain and sinus pain.   Respiratory: Positive for cough, shortness of breath and wheezing.   Cardiovascular: Negative for chest pain, palpitations and leg swelling.  Gastrointestinal: Negative for abdominal pain and nausea.       Gerd controlled  Neurological: Positive for headaches. Negative for light-headedness (occasional).       Objective:   Vitals:   11/03/18 1427  BP: 108/64  Pulse: 80  Resp: 16  Temp: 98.5 F (36.9 C)  SpO2: 90%   BP Readings from Last 3 Encounters:  11/03/18 108/64  10/16/18 116/70  10/08/18 (!) 170/97   Wt Readings from Last 3 Encounters:  11/03/18 147 lb (66.7 kg)  10/16/18 149 lb 6.4 oz (67.8 kg)  10/08/18 154 lb (69.9 kg)   Body mass index is 26.04 kg/m.   Physical Exam    Constitutional: Appears well-developed and well-nourished. No distress.  HENT:  Head: Normocephalic and atraumatic.  Neck: Neck supple. No tracheal deviation present. No thyromegaly present.  No cervical lymphadenopathy Cardiovascular: Normal rate, regular rhythm and normal heart sounds.   No murmur heard. No carotid bruit .  No edema Pulmonary/Chest: Effort normal and breath sounds normal. No respiratory distress. No has no wheezes. No rales.  Skin: Skin is warm and dry. Not diaphoretic.  Psychiatric: Normal mood and affect. Behavior is normal.   Diabetic Foot Exam - Simple   Simple Foot Form Diabetic Foot exam was performed with the following findings:  Yes 11/03/2018  3:12 PM  Visual Inspection No deformities, no ulcerations, no other skin breakdown  bilaterally:  Yes Sensation Testing See comments:  Yes Pulse Check Posterior Tibialis and Dorsalis pulse intact bilaterally:  Yes Comments Intact sensation in left foot. significantly decreased sensation plantar surface of right foot - distal >> proximal       Assessment & Plan:    See Problem List for Assessment and Plan of chronic medical problems.

## 2018-11-03 NOTE — Assessment & Plan Note (Signed)
Not always compliant with a diabetic diet Check A1c Encouraged increased activity Work on weight loss

## 2018-11-03 NOTE — Patient Instructions (Addendum)
  Tests ordered today. Your results will be released to [place] (or called to you) after review, usually within 72hours after test completion. If any changes need to be made, you will be notified at that same time.  Increase your water intake.    Medications reviewed and updated.  Changes include :   Taking doxycyline for your cough.   Your prescription(s) have been submitted to your pharmacy. Please take as directed and contact our office if you believe you are having problem(s) with the medication(s).   Please followup in 6 months

## 2018-11-03 NOTE — Assessment & Plan Note (Signed)
Check lipid panel  Continue daily statin Regular exercise and healthy diet encouraged  

## 2018-11-03 NOTE — Assessment & Plan Note (Signed)
GERD controlled Continue daily medication  

## 2018-11-03 NOTE — Assessment & Plan Note (Signed)
Symptoms consistent with bacterial bronchitis with COPD exacerbation Start doxycycline twice daily x10 days Continue inhalers Stressed smoking cessation

## 2018-11-05 ENCOUNTER — Encounter: Payer: Self-pay | Admitting: Internal Medicine

## 2018-11-10 ENCOUNTER — Encounter: Payer: Medicare Other | Attending: Physical Medicine & Rehabilitation | Admitting: Registered Nurse

## 2018-11-10 ENCOUNTER — Telehealth: Payer: Self-pay | Admitting: Internal Medicine

## 2018-11-10 ENCOUNTER — Encounter: Payer: Self-pay | Admitting: Registered Nurse

## 2018-11-10 VITALS — BP 119/78 | HR 89 | Ht 63.0 in | Wt 148.0 lb

## 2018-11-10 DIAGNOSIS — E785 Hyperlipidemia, unspecified: Secondary | ICD-10-CM | POA: Diagnosis not present

## 2018-11-10 DIAGNOSIS — G894 Chronic pain syndrome: Secondary | ICD-10-CM

## 2018-11-10 DIAGNOSIS — Z79891 Long term (current) use of opiate analgesic: Secondary | ICD-10-CM | POA: Diagnosis not present

## 2018-11-10 DIAGNOSIS — G2581 Restless legs syndrome: Secondary | ICD-10-CM | POA: Diagnosis not present

## 2018-11-10 DIAGNOSIS — Z9981 Dependence on supplemental oxygen: Secondary | ICD-10-CM | POA: Diagnosis not present

## 2018-11-10 DIAGNOSIS — Z79899 Other long term (current) drug therapy: Secondary | ICD-10-CM | POA: Diagnosis not present

## 2018-11-10 DIAGNOSIS — G9619 Other disorders of meninges, not elsewhere classified: Secondary | ICD-10-CM | POA: Diagnosis not present

## 2018-11-10 DIAGNOSIS — M5126 Other intervertebral disc displacement, lumbar region: Secondary | ICD-10-CM | POA: Insufficient documentation

## 2018-11-10 DIAGNOSIS — G4709 Other insomnia: Secondary | ICD-10-CM

## 2018-11-10 DIAGNOSIS — M4316 Spondylolisthesis, lumbar region: Secondary | ICD-10-CM | POA: Diagnosis not present

## 2018-11-10 DIAGNOSIS — M170 Bilateral primary osteoarthritis of knee: Secondary | ICD-10-CM | POA: Diagnosis not present

## 2018-11-10 DIAGNOSIS — J449 Chronic obstructive pulmonary disease, unspecified: Secondary | ICD-10-CM | POA: Insufficient documentation

## 2018-11-10 DIAGNOSIS — M75102 Unspecified rotator cuff tear or rupture of left shoulder, not specified as traumatic: Secondary | ICD-10-CM

## 2018-11-10 DIAGNOSIS — Z720 Tobacco use: Secondary | ICD-10-CM | POA: Diagnosis not present

## 2018-11-10 DIAGNOSIS — M5136 Other intervertebral disc degeneration, lumbar region: Secondary | ICD-10-CM

## 2018-11-10 DIAGNOSIS — F411 Generalized anxiety disorder: Secondary | ICD-10-CM

## 2018-11-10 DIAGNOSIS — M2170 Unequal limb length (acquired), unspecified site: Secondary | ICD-10-CM | POA: Diagnosis not present

## 2018-11-10 DIAGNOSIS — E039 Hypothyroidism, unspecified: Secondary | ICD-10-CM | POA: Insufficient documentation

## 2018-11-10 DIAGNOSIS — Z5181 Encounter for therapeutic drug level monitoring: Secondary | ICD-10-CM

## 2018-11-10 DIAGNOSIS — E079 Disorder of thyroid, unspecified: Secondary | ICD-10-CM | POA: Diagnosis not present

## 2018-11-10 DIAGNOSIS — Z9889 Other specified postprocedural states: Secondary | ICD-10-CM | POA: Insufficient documentation

## 2018-11-10 DIAGNOSIS — I10 Essential (primary) hypertension: Secondary | ICD-10-CM | POA: Diagnosis not present

## 2018-11-10 DIAGNOSIS — M51369 Other intervertebral disc degeneration, lumbar region without mention of lumbar back pain or lower extremity pain: Secondary | ICD-10-CM

## 2018-11-10 DIAGNOSIS — M961 Postlaminectomy syndrome, not elsewhere classified: Secondary | ICD-10-CM

## 2018-11-10 DIAGNOSIS — IMO0002 Reserved for concepts with insufficient information to code with codable children: Secondary | ICD-10-CM

## 2018-11-10 DIAGNOSIS — K219 Gastro-esophageal reflux disease without esophagitis: Secondary | ICD-10-CM | POA: Insufficient documentation

## 2018-11-10 DIAGNOSIS — M7918 Myalgia, other site: Secondary | ICD-10-CM

## 2018-11-10 MED ORDER — FENTANYL 50 MCG/HR TD PT72: 1.0000 | MEDICATED_PATCH | TRANSDERMAL | 0 refills | Status: DC

## 2018-11-10 MED ORDER — CLONAZEPAM 2 MG PO TABS: ORAL_TABLET | ORAL | 2 refills | Status: DC

## 2018-11-10 MED ORDER — NYSTATIN 100000 UNIT/ML MT SUSP: 5.0000 mL | Freq: Four times a day (QID) | OROMUCOSAL | 0 refills | Status: DC

## 2018-11-10 MED ORDER — HYDROCODONE-ACETAMINOPHEN 10-325 MG PO TABS: 1.0000 | ORAL_TABLET | Freq: Four times a day (QID) | ORAL | 0 refills | Status: DC | PRN

## 2018-11-10 NOTE — Telephone Encounter (Signed)
Copied from [place] [id number]. Topic: Quick Communication - Rx Refill/Question >> Nov 10, 2018  9:04 AM Loma Boston wrote: CRM for notification. See Telephone encounter for: 11/10/18.doxycycline (VIBRA-TABS) 100 MG tablet pt is allergic to this that was given on 3/3 her toungue is red an raw also as far done as she can see in her throat, it just burns and gets worse everytime she takes one, advised to stop taking, requested something else needs to be called in and she also request that something be called in to soothe what this has caused. [facility] [place], [place]

## 2018-11-10 NOTE — Progress Notes (Signed)
Subjective:    Patient ID: [name], female    DOB: [birth year], 63 y.o.   MRN: [id number]  HPI: Jill Phillips is a 63 y.o. female who returns for follow up appointment for chronic pain and medication refill. She states her pain is located in her left shoulder, lower back pain and left knee. She rates her pain 5. Her current exercise regime is walking.  [name] Morphine equivalent is 160.00 MME. She  is also prescribed Clonazepam .We have discussed the black box warning of using opioids and benzodiazepines. I highlighted the dangers of using these drugs together and discussed the adverse events including respiratory suppression, overdose, cognitive impairment and importance of compliance with current regimen. We will continue to monitor and adjust as indicated.   Lat Oral Swab was Performed on 02/27/2018, it was consistent. UDS ordered today.  Pain Inventory Average Pain 7 Pain Right Now 5 My pain is intermittent  In the last 24 hours, has pain interfered with the following? General activity 8 Relation with others 9 Enjoyment of life 2 What TIME of day is your pain at its worst? daytime Sleep (in general) Good  Pain is worse with: walking, standing and some activites Pain improves with: medication Relief from Meds: 5  Mobility walk with assistance use a cane ability to climb steps?  no do you drive?  yes  Function disabled: date disabled .  Neuro/Psych No problems in this area  Prior Studies Any changes since last visit?  no  Physicians involved in your care Any changes since last visit?  no   Family History  Problem Relation Age of Onset  . Kidney disease Mother   . Heart disease Father   . Anuerysm Brother 29       brain  . Heart disease Brother   . Heart disease Sister 48       s/p CABG  . Hypertension Sister   . Colon cancer Neg Hx    Social History   Socioeconomic History  . Marital status: Divorced    Spouse name: n/a  . Number of  children: 2  . Years of education: 12+  . Highest education level: Not on file  Occupational History  . Occupation: disability    Comment: back surgeries  Social Needs  . Financial resource strain: Not on file  . Food insecurity:    Worry: Not on file    Inability: Not on file  . Transportation needs:    Medical: Not on file    Non-medical: Not on file  Tobacco Use  . Smoking status: Current Every Day Smoker    Packs/day: 1.25    Years: 45.00    Pack years: 56.25    Types: Cigarettes  . Smokeless tobacco: Never Used  . Tobacco comment: form given 12/25/16  Substance and Sexual Activity  . Alcohol use: No    Alcohol/week: 0.0 standard drinks  . Drug use: No    Types: Marijuana    Comment: 01/31/2016 "none since ~ 1980"  . Sexual activity: Never    Partners: Male  Lifestyle  . Physical activity:    Days per week: Not on file    Minutes per session: Not on file  . Stress: Not on file  Relationships  . Social connections:    Talks on phone: Not on file    Gets together: Not on file    Attends religious service: Not on file    Active member of club or  organization: Not on file    Attends meetings of clubs or organizations: Not on file    Relationship status: Not on file  Other Topics Concern  . Not on file  Social History Narrative   Lives alone.  One daughter is local, but is getting ready to move to [place], where her children live with their father.  The other daughter lives near [place], [place].   Past Surgical History:  Procedure Laterality Date  . APPENDECTOMY    . BACK SURGERY     18 back surgeries (2 thoracic & 16 lumbar) (01/31/2016)  . DILATION AND CURETTAGE OF UTERUS    . HAMMER TOE SURGERY    . IR RADIOLOGIST EVAL & MGMT  07/21/2017  . JOINT REPLACEMENT    . KNEE ARTHROSCOPY Right   . LAPAROSCOPIC CHOLECYSTECTOMY    . LUMBAR FUSION N/A 08/01/2015   Procedure: Right sided L1-2 and L2-3 transforaminal lumbar interbody fusion with cages, Extension of  posterior fusion T12 to L3, Replaced pedicle screws bilaterally L1-L2 , Replaced left sided pedicle screws L-3. Instrumentation T12 to L3 using local bone graft, Vivigen allograft and cancellous chips;  Surgeon: [name], MD;  Location: [facility];  Service: Orthopedics;  Laterality: N/A;  . LUMBAR LAMINECTOMY/DECOMPRESSION MICRODISCECTOMY N/A 01/28/2014   Procedure: Minimally Invasive Right  L1-2 Microdiscectomy;  Surgeon: [name], MD;  Location: [facility];  Service: Orthopedics;  Laterality: N/A;  . TOTAL HIP ARTHROPLASTY Right   . TOTAL KNEE ARTHROPLASTY  05/29/2012   Procedure: TOTAL KNEE ARTHROPLASTY;  Surgeon: Mcarthur Rossetti, MD;  Location: [facility];  Service: Orthopedics;  Laterality: Right;  Right Total Knee Arthroplasty  . TUBAL LIGATION    . UVULOPALATOPHARYNGOPLASTY    . VAGINAL HYSTERECTOMY     Past Medical History:  Diagnosis Date  . Active smoker   . Anxiety   . Calcifying tendinitis of shoulder   . Chronic back pain   . Chronic pain syndrome   . COPD (chronic obstructive pulmonary disease) ([place])   . Dysthymic disorder   . Emphysema lung ([place])   . GERD (gastroesophageal reflux disease)   . Headache    "weekly maybe" (01/31/2016)  . Heart murmur    for years, nothing to be concerned about  . Herpes genitalia   . History of blood transfusion 1980   related to "back surgery"  . Hyperlipidemia   . Hypertension   . Hypothyroidism   . Lumbago   . Osteoarthrosis, unspecified whether generalized or localized, lower leg   . Pain in joint, upper arm   . Pneumothorax, left 01/31/2016   S/P Left posterior subcostal pain injection on 01/30/2016  . PONV (postoperative nausea and vomiting)    gets nauseous  with longer surgery. Difficuty voiding after surgery  . Postlaminectomy syndrome, thoracic region   . Primary localized osteoarthrosis, lower leg   . Restless leg syndrome   . Sleep apnea    s/p surgery- last sleep study 2011- doesnt use oxygen or machine at night as  instructed,.   12/2014- Dr Halford Chessman  reports it is negative.  . Thyroid disease    BP 119/78   Pulse 89   Ht 5\' 3"  (1.6 m)   Wt 148 lb (67.1 kg)   SpO2 90%   BMI 26.22 kg/m   Opioid Risk Score:   Fall Risk Score:  `1  Depression screen PHQ 2/9  Depression screen [facility] 2/9 10/16/2018 09/16/2018 06/22/2018 01/01/2018 12/01/2017 10/10/2017 09/10/2017  Decreased Interest 0 0  0 0 0 1 1  Down, Depressed, Hopeless 0 0 0 0 0 1 1  PHQ - 2 Score 0 0 0 0 0 2 2  Some recent data might be hidden     Review of Systems  Constitutional: Negative.   HENT: Negative.   Eyes: Negative.   Respiratory: Negative.   Cardiovascular: Negative.   Gastrointestinal: Negative.   Endocrine: Negative.   Genitourinary: Negative.   Musculoskeletal: Positive for arthralgias and gait problem.  Skin: Negative.   Allergic/Immunologic: Negative.   Hematological: Negative.   Psychiatric/Behavioral: Negative.   All other systems reviewed and are negative.      Objective:   Physical Exam Constitutional:      Appearance: Normal appearance.  Neck:     Musculoskeletal: Normal range of motion and neck supple.  Cardiovascular:     Rate and Rhythm: Normal rate and regular rhythm.     Pulses: Normal pulses.     Heart sounds: Normal heart sounds.  Pulmonary:     Effort: Pulmonary effort is normal.     Breath sounds: Normal breath sounds.  Musculoskeletal:     Comments: Normal Muscle Bulk and Muscle Testing Reveals:  Upper Extremities: Right: Full ROM and Muscle Strength 5/5 Left: Decreased ROM 90 Degrees and Muscle Strength 5/5  Lumbar Paraspinal Tenderness: L-4-L-5 Lower Extremities: Right: Full ROM and Muscle Strength 5/5 Left: Decreased ROM and Muscle Strength 5/5 Left Lower Extremity Flexion Produces Pain into Left Lower Extremity  Arises from Table slowly using cane for support Narrow Based Gait   Skin:    General: Skin is warm and dry.  Neurological:     Mental Status: She is alert and oriented to person,  place, and time.  Psychiatric:        Mood and Affect: Mood normal.        Behavior: Behavior normal.           Assessment & Plan:  1. 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 2. Chronic Thoracic Pain:No complaints Today.Continue current medication regime and Continue to Monitor.11/10/2018 3. Osteoarthritis of left knee:Continue with Heat, exercise and voltaren.11/10/2018 4. Rotator cuff syndrome/Leftsubacromial bursitis:Dr. Flavia Shipper Following. 11/10/2018 5. Restless legs syndrome:Continue with current treatment withRequip.Continue to monitor.11/10/2018 6. Tobacco Abuse:Encourage and Educated onSmoking Cessation: PCP prescribed Nicotine Patches : 11/10/2018 7. Muscle Spasm:Continue current treatment Regimen withTizanidine.11/10/2018 8. Insomnia:Continue current treatment regimen withSeroquel. 11/10/2018. 9. Neuropathic Pain:Continue current treatment regimen withGabapentin.11/10/2018 10.Anxiety:Continue current treatment regimen withKlonopin. 11/10/2018 11 Chronic Rectal Pain: No complaints today.Continue Pelvic Floor Therapy.Dr. [name] Following.11/10/2018.  20 minutes of face to face patient care time was spent during this visit. All questions were encouraged and answered.  F/u in 1 month

## 2018-11-10 NOTE — Telephone Encounter (Signed)
Pt aware of response and expressed understanding.  

## 2018-11-10 NOTE — Telephone Encounter (Signed)
Sounds like possible thrush more than an allergic reaction.  Will prescribe nystatin  - she had about 7 days of antibiotic - lets hold off on additional antibiotic for now since any antibiotic can worsen her tongue.  Call if tongue does not improve

## 2018-11-14 ENCOUNTER — Telehealth: Payer: Self-pay | Admitting: Registered Nurse

## 2018-11-14 DIAGNOSIS — M4316 Spondylolisthesis, lumbar region: Secondary | ICD-10-CM

## 2018-11-14 DIAGNOSIS — M75102 Unspecified rotator cuff tear or rupture of left shoulder, not specified as traumatic: Secondary | ICD-10-CM

## 2018-11-14 DIAGNOSIS — IMO0002 Reserved for concepts with insufficient information to code with codable children: Secondary | ICD-10-CM

## 2018-11-16 ENCOUNTER — Ambulatory Visit: Payer: Self-pay

## 2018-11-16 MED ORDER — FLUCONAZOLE 100 MG PO TABS: 100.0000 mg | ORAL_TABLET | Freq: Every day | ORAL | 0 refills | Status: DC

## 2018-11-16 MED ORDER — AMOXICILLIN-POT CLAVULANATE 875-125 MG PO TABS: 1.0000 | ORAL_TABLET | Freq: Two times a day (BID) | ORAL | 0 refills | Status: DC

## 2018-11-16 NOTE — Telephone Encounter (Signed)
Pt was seen at an OV 11/05/18 and was prescribed Doxyclycline. Pt quit taking the abx 11/10/18 because of tongue pain. Pt is calling to be put on another abx and something else for oral pain. Pt stated she tried the magic mouthwash and it "set her mouth on fire." pt is requesting Diflucan for the tongue pain. Pt stated her tongue was hurting prior to being prescribed the antibiotics. Pt stated symptoms are moderate.  Routing note to PCP office.   Reason for Disposition . Pharmacy calling with prescription questions and triager unable to answer question  Answer Assessment - Initial Assessment Questions 1. SYMPTOMS: "Do you have any symptoms?"     Tongue was hurting before being placed on antibiotics and Magic Mouthwash "set her mouth on fire" Quit taking abx on 11/10/18-white patches are gone but states tongue is raw and is asking Diflucan and a different abx 2. SEVERITY: If symptoms are present, ask "Are they mild, moderate or severe?"     moderate  Protocols used: MEDICATION QUESTION CALL-A-AH

## 2018-11-16 NOTE — Addendum Note (Signed)
Addended by: [name] on: 11/16/2018 04:06 PM   Modules accepted: Orders

## 2018-11-16 NOTE — Telephone Encounter (Signed)
Pt aware of response below. Rxs sent. Pt understood to come back in if she does not get any better.

## 2018-11-16 NOTE — Addendum Note (Signed)
Addended by: Delice Bison E on: 11/16/2018 04:21 PM   Modules accepted: Orders

## 2018-11-16 NOTE — Telephone Encounter (Signed)
I did not prescribe magic mouthwash - I had prescribed nystatin rinse.    We can do another antibiotic but it will make her tongue/mouth worse since all can cause thrush and many antibiotics have caused this for her in the past.     We will do a shorter course of antibiotics since she already took some and diflucan, but if her symptoms do not improve she should come in.      augmentin and diflucan pending

## 2018-11-17 ENCOUNTER — Telehealth: Payer: Self-pay | Admitting: *Deleted

## 2018-11-17 LAB — TOXASSURE SELECT,+ANTIDEPR,UR

## 2018-11-17 NOTE — Telephone Encounter (Signed)
Ordered by Zella Ball

## 2018-11-17 NOTE — Telephone Encounter (Signed)
Urine drug screen for this encounter is consistent for prescribed medication 

## 2018-11-25 ENCOUNTER — Telehealth: Payer: Self-pay | Admitting: Internal Medicine

## 2018-11-25 NOTE — Telephone Encounter (Signed)
Do you want to try a virtual visit or OV?

## 2018-11-25 NOTE — Telephone Encounter (Signed)
Patient will do virtual visit.

## 2018-11-25 NOTE — Telephone Encounter (Signed)
Ideally virtual visit.  If not possible she should send me my chart message.

## 2018-11-25 NOTE — Telephone Encounter (Signed)
Copied from [place] [phone]. Topic: Quick Communication - See Telephone Encounter >> Nov 25, 2018  2:55 PM Reyne Dumas L wrote: CRM for notification. See Telephone encounter for: 11/25/18.  Pt called and left a voicemail on the [facility] voicemail box line.  States that she has had 2 antibiotics since 03/03 and still has congestion when breathing in and out, that she has no fever, that she is sick on her stomach, and that she is generally not feeling well. Pt can be reached at [phone]

## 2018-11-26 ENCOUNTER — Ambulatory Visit (INDEPENDENT_AMBULATORY_CARE_PROVIDER_SITE_OTHER): Payer: Medicare Other | Admitting: Internal Medicine

## 2018-11-26 ENCOUNTER — Encounter: Payer: Self-pay | Admitting: Internal Medicine

## 2018-11-26 DIAGNOSIS — J441 Chronic obstructive pulmonary disease with (acute) exacerbation: Secondary | ICD-10-CM | POA: Diagnosis not present

## 2018-11-26 DIAGNOSIS — J209 Acute bronchitis, unspecified: Secondary | ICD-10-CM

## 2018-11-26 MED ORDER — LEVOFLOXACIN 500 MG PO TABS: 500.0000 mg | ORAL_TABLET | Freq: Every day | ORAL | 0 refills | Status: DC

## 2018-11-26 MED ORDER — PREDNISONE 10 MG PO TABS: ORAL_TABLET | ORAL | 0 refills | Status: DC

## 2018-11-26 NOTE — Telephone Encounter (Signed)
Set up for 10 am today.

## 2018-11-26 NOTE — Assessment & Plan Note (Signed)
Wheezing heard while I was talking to her with deep breaths Experiencing chest tightness, thick sputum, cough and mild shortness of breath Persistent bronchitis causing COPD exacerbation Levaquin 500 mg daily for 7 days-she has taken this in the past and tolerated it Prednisone taper Continue albuterol as needed, Spiriva daily and over-the-counter cold medications for symptom relief Advised her to give it a few days, but if not feeling better she should let me know Encourage smoking cessation

## 2018-11-26 NOTE — Progress Notes (Signed)
Virtual Visit via Video Note  I connected with Jill Phillips on 11/26/18 at 10:00 AM EDT by a video enabled telemedicine application and verified that I am speaking with the correct person using two identifiers.   I discussed the limitations of evaluation and management by telemedicine and the availability of in person appointments. The patient expressed understanding and agreed to proceed.  History of Present Illness: She is doing a virtual video visit for cold symptoms.   Her symptoms started about 6 weeks ago.  I saw her 3 weeks ago when she was having a wet cough, wheeze and shortness of breath.  She did not have a fever or other cold symptoms.  Concerned about bacterial bronchitis with COPD exacerbation.  She is still smoking.  She continued her inhalers and was started on doxycycline for 10 days, which she completed 7 days only because it sounded like she developed thrush or a reaction to doxy.  Doxycycline discontinued and she was started on something for the thrush.  She called after a week and she was still having tongue symptoms and cold symptoms.  I started her on Augmentin and Diflucan.  Her mouth improved and she stopped the diflucan.  She completed the Augmentin.  She is using her albuterol inhaler as needed.  She uses a Spiriva inhaler daily.  She is taking advil and tylenol as needed  She called yesterday stating persistent symptoms.  She is experiencing a wet cough and it feels like thick sputum, but she can not get it up, wheeze, chest tightness and mild SOB.  She has some nasal congestion, nausea and severe headaches.  She denies fever, chills, sinus pain, ear pain, sore throat, and lightheadedness.     Observations/Objective: She appears well and in no acute distress With deep breathes wheezing is heard   Assessment and Plan:  See Problem List for Assessment and Plan of chronic medical problems.   Follow Up Instructions:    I discussed the assessment and treatment  plan with the patient. The patient was provided an opportunity to ask questions and all were answered. The patient agreed with the plan and demonstrated an understanding of the instructions.   The patient was advised to call back or seek an in-person evaluation if the symptoms worsen or if the condition fails to improve as anticipated.  I provided 15 minutes of non-face-to-face time during this encounter.   [name], MD

## 2018-11-26 NOTE — Assessment & Plan Note (Signed)
Persistent symptoms suggestive of bacterial bronchitis Started doxycycline, but did not completed, but did complete Augmentin Still experiencing thick sputum, significant wheezing, cough, chest tightness mild shortness of breath In addition to bronchitis has COPD exacerbation Levaquin 500 mg daily for 7 days-she has taken this in the past and tolerated it Prednisone taper Continue albuterol as needed, Spiriva daily and over-the-counter cold medications for symptom relief Advised her to give it a few days, but if not feeling better she should let me know Encourage smoking cessation

## 2018-12-02 ENCOUNTER — Encounter (HOSPITAL_COMMUNITY): Payer: Self-pay | Admitting: Family Medicine

## 2018-12-02 ENCOUNTER — Emergency Department (HOSPITAL_COMMUNITY)
Admission: EM | Admit: 2018-12-02 | Discharge: 2018-12-03 | Disposition: A | Discharge status: Home / Self Care | Payer: Medicare Other | Attending: Emergency Medicine | Admitting: Emergency Medicine

## 2018-12-02 DIAGNOSIS — Z96651 Presence of right artificial knee joint: Secondary | ICD-10-CM | POA: Insufficient documentation

## 2018-12-02 DIAGNOSIS — I951 Orthostatic hypotension: Secondary | ICD-10-CM | POA: Diagnosis not present

## 2018-12-02 DIAGNOSIS — Z79899 Other long term (current) drug therapy: Secondary | ICD-10-CM | POA: Insufficient documentation

## 2018-12-02 DIAGNOSIS — E039 Hypothyroidism, unspecified: Secondary | ICD-10-CM | POA: Insufficient documentation

## 2018-12-02 DIAGNOSIS — R61 Generalized hyperhidrosis: Secondary | ICD-10-CM | POA: Diagnosis not present

## 2018-12-02 DIAGNOSIS — W19XXXA Unspecified fall, initial encounter: Secondary | ICD-10-CM | POA: Diagnosis not present

## 2018-12-02 DIAGNOSIS — F17210 Nicotine dependence, cigarettes, uncomplicated: Secondary | ICD-10-CM | POA: Insufficient documentation

## 2018-12-02 DIAGNOSIS — R55 Syncope and collapse: Secondary | ICD-10-CM | POA: Diagnosis not present

## 2018-12-02 DIAGNOSIS — I959 Hypotension, unspecified: Secondary | ICD-10-CM | POA: Diagnosis not present

## 2018-12-02 DIAGNOSIS — J449 Chronic obstructive pulmonary disease, unspecified: Secondary | ICD-10-CM | POA: Diagnosis not present

## 2018-12-02 DIAGNOSIS — R42 Dizziness and giddiness: Secondary | ICD-10-CM | POA: Diagnosis present

## 2018-12-02 DIAGNOSIS — I10 Essential (primary) hypertension: Secondary | ICD-10-CM | POA: Diagnosis not present

## 2018-12-02 DIAGNOSIS — Z96641 Presence of right artificial hip joint: Secondary | ICD-10-CM | POA: Diagnosis not present

## 2018-12-02 DIAGNOSIS — E119 Type 2 diabetes mellitus without complications: Secondary | ICD-10-CM | POA: Diagnosis not present

## 2018-12-02 DIAGNOSIS — J80 Acute respiratory distress syndrome: Secondary | ICD-10-CM | POA: Diagnosis not present

## 2018-12-02 MED ORDER — SODIUM CHLORIDE 0.9 % IV BOLUS
1000.0000 mL | Freq: Once | INTRAVENOUS | Status: AC
  Administered 2018-12-03: 1000 mL via INTRAVENOUS

## 2018-12-02 MED ORDER — SODIUM CHLORIDE 0.9 % IV SOLN
INTRAVENOUS | Status: DC
  Administered 2018-12-03: 01:00:00 via INTRAVENOUS

## 2018-12-02 NOTE — ED Triage Notes (Signed)
Patient is from home and transported via [facility]. Patient was attempting to go to the restroom when she reports she got dizzy and had a syncopal episode. Patient hit head on the bedpost and has an abrasion to her left eye. According to EMS, patient is positive for orthostatics. EMS removed an Fentanyl patch from her back.

## 2018-12-02 NOTE — ED Notes (Signed)
Bed: YB53 Expected date:  Expected time:  Means of arrival:  Comments: EMS 63 yo female from home syncopal episode

## 2018-12-02 NOTE — ED Provider Notes (Signed)
[facility] DEPT Provider Note  CSN: [id number] Arrival date & time: 12/02/18 2258  Chief Complaint(s) Fall and Loss of Consciousness  HPI Jill Phillips is a 63 y.o. female with extensive past medical history listed below who presents to the emergency department after syncopal episode resulting in fall and facial trauma.  Patient reports that she took her typical nightly medications including Seroquel, Klonopin, Neurontin, and Lexapro.  She awoke this evening to go to the restroom.  When she stood up out of bed, the patient felt lightheaded and passed out.  She is unsure how long she was unconscious for.  Reports that she hit her face on the floor.  Complaining of left-sided facial pain.  Patient denied any associated chest pain or shortness of breath.  Reports that she has not been hydrating well and states that she is only been drinking coffee.  She denies any alcohol or drug use.  Reports that she recently had bronchitis last month but denies any recent fevers.  She does endorse a mild cough.  Patient still smoking.  Denies any neck pain, back pain, new extremity pain, other than her chronic left shoulder pain.  Patient also had fentanyl patch which was removed by EMS.  EMS noted that she was orthostatic on their assessment.  The history is provided by the patient.    Past Medical History Past Medical History:  Diagnosis Date  . Active smoker   . Anxiety   . Calcifying tendinitis of shoulder   . Chronic back pain   . Chronic pain syndrome   . COPD (chronic obstructive pulmonary disease) ([place])   . Dysthymic disorder   . Emphysema lung ([place])   . GERD (gastroesophageal reflux disease)   . Headache    "weekly maybe" (01/31/2016)  . Heart murmur    for years, nothing to be concerned about  . Herpes genitalia   . History of blood transfusion 1980   related to "back surgery"  . Hyperlipidemia   . Hypertension   . Hypothyroidism   . Lumbago   .  Osteoarthrosis, unspecified whether generalized or localized, lower leg   . Pain in joint, upper arm   . Pneumothorax, left 01/31/2016   S/P Left posterior subcostal pain injection on 01/30/2016  . PONV (postoperative nausea and vomiting)    gets nauseous  with longer surgery. Difficuty voiding after surgery  . Postlaminectomy syndrome, thoracic region   . Primary localized osteoarthrosis, lower leg   . Restless leg syndrome   . Sleep apnea    s/p surgery- last sleep study 2011- doesnt use oxygen or machine at night as instructed,.   12/2014- Dr Halford Chessman  reports it is negative.  . Thyroid disease    Patient Active Problem List   Diagnosis Date Noted  . COPD with exacerbation ([place]) 11/26/2018  . Acute bronchitis 11/03/2018  . Disorder of rotator cuff syndrome of left shoulder and allied disorder 06/22/2018  . Leg edema, left 06/04/2018  . Type 2 diabetes mellitus without complication, without long-term current use of insulin ([place]) 12/02/2017  . Sacral pain 09/17/2017  . COPD (chronic obstructive pulmonary disease) ([place]) 04/21/2017  . Lower back pain 03/03/2017  . CKD (chronic kidney disease) stage 3, GFR 30-59 ml/min ([facility]) 08/07/2016  . GERD (gastroesophageal reflux disease) 08/07/2016  . Chronic respiratory failure ([place]) 07/29/2016  . Hypersomnia 07/29/2016  . Arthritis of carpometacarpal [facility]) joint of left thumb 07/09/2016  . Pneumothorax on left 01/31/2016  . Chronic, continuous use  of opioids 09/06/2015  . Degenerative disc disease, lumbar 08/01/2015    Class: Chronic  . Spondylolisthesis of lumbar region 08/01/2015    Class: Chronic  . Urinary, incontinence, stress female 05/06/2014  . Constipation 05/05/2014  . 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 HTN (hypertension) 05/19/2013  . HLD (hyperlipidemia) 05/19/2013  . Depression 05/19/2013  . Hypothyroidism 05/19/2013  . Tobacco abuse   . Osteoarthritis of both knees 11/18/2011  . RLS (restless legs syndrome) 11/18/2011   Home  Medication(s) Prior to Admission medications   Medication Sig Start Date End Date Taking? Authorizing Provider  albuterol (PROAIR HFA) 108 (90 Base) MCG/ACT inhaler INHALE 1 PUFF INTO THE LUNGS EVERY 6 HOURS AS NEEDED FOR WHEEZING OR SHORTNESS OF BREATH.NEED OFFICE VISIT FOR FURTHER REFILLS 09/05/16   Nche, Charlene Brooke, NP  amLODipine (NORVASC) 10 MG tablet TAKE 1 TABLET BY MOUTH DAILY. 05/13/18   [name], MD  aspirin-acetaminophen-caffeine (EXCEDRIN MIGRAINE) [phone] MG tablet Take 1 tablet by mouth every 6 (six) hours as needed for headache.    [provider]  atorvastatin (LIPITOR) 20 MG tablet TAKE 1 TABLET BY MOUTH DAILY. 08/31/18   [name], MD  benazepril (LOTENSIN) 40 MG tablet Take 1 tablet (40 mg total) by mouth daily. 11/03/18   [name], MD  Blood Glucose Monitoring Suppl (ONETOUCH VERIO FLEX SYSTEM) w/Device KIT USE TO TEST BLOOD SUGAR UP TO 4 TIMES A DAY 06/08/18   Burns, Claudina Lick, MD  clonazePAM (KLONOPIN) 2 MG tablet TAKE 1 TABLET BY MOUTH 2 TIMES DAILY AS NEEDED FOR ANXIETY. 11/10/18   Bayard Hugger, NP  diphenhydrAMINE (BENADRYL) 25 MG tablet Take 25-50 mg by mouth every 6 (six) hours as needed (bladder).    [provider]  fentaNYL (DURAGESIC) 50 MCG/HR Place 1 patch onto the skin every 3 (three) days. 11/10/18   Bayard Hugger, NP  fluconazole (DIFLUCAN) 100 MG tablet Take 1 tablet (100 mg total) by mouth daily. 11/16/18   [name], MD  gabapentin (NEURONTIN) 300 MG capsule Take 1 capsule (300 mg total) by mouth 5 (five) times daily. 10/16/18   Bayard Hugger, NP  HYDROcodone-acetaminophen (NORCO) 10-325 MG tablet Take 1 tablet by mouth every 6 (six) hours as needed. 11/10/18   Bayard Hugger, NP  levofloxacin (LEVAQUIN) 500 MG tablet Take 1 tablet (500 mg total) by mouth daily. 11/26/18   [name], MD  levothyroxine (SYNTHROID, LEVOTHROID) 88 MCG tablet TAKE 1 TABLET BY MOUTH DAILY. 09/03/18   [name], MD  Meth-Hyo-M Bl-Na  Phos-Ph Sal (URIBEL) 118 MG CAPS Take by mouth 3 (three) times daily.     [provider]  omeprazole (PRILOSEC) 40 MG capsule TAKE 1 CAPSULE BY MOUTH 2 TIMES DAILY BEFORE A MEAL. 08/28/18   [name], MD  predniSONE (DELTASONE) 10 MG tablet 6 tabs po qd x 3 days, 4 tabs po qd x 3 days, 3 tabs po qd x 3 days, 2 tabs po qd x 3 days, 1 tab po qd x 3 days 11/26/18   [name], MD  PROAIR HFA 108 (90 Base) MCG/ACT inhaler INHALE 2 PUFFS INTO THE LUNGS EVERY 6 HOURS AS NEEDED FOR WHEEZING OR SHORTNESS OF BREATH. 06/12/18   Parrett, Fonnie Mu, NP  QUEtiapine (SEROQUEL) 100 MG tablet TAKE 1 TABLET BY MOUTH AT BEDTIME. 10/22/18   Meredith Staggers, MD  rOPINIRole (REQUIP) 0.5 MG tablet TAKE 2 TABLETS BY MOUTH AT BEDTIME. 06/12/18  Meredith Staggers, MD  Sennosides (SENNA LAX PO) Take 1 tablet by mouth daily. As directed 05/20/17   [provider]  SPIRIVA HANDIHALER 18 MCG inhalation capsule PLACE 1 CAPSULE INTO INHALER AND INHALE ONCE DAILY. 04/02/17   Chesley Mires, MD  valACYclovir (VALTREX) 500 MG tablet TAKE 1 TABLET BY MOUTH DAILY. 09/24/18   [name], MD  venlafaxine XR (EFFEXOR-XR) 150 MG 24 hr capsule TAKE 2 CAPSULES BY MOUTH DAILY. 09/24/18   [name], MD  Vitamin D, Ergocalciferol, (DRISDOL) 50000 units CAPS capsule Take 1 capsule (50,000 Units total) by mouth every 7 (seven) days. 03/07/17   [name], MD                                                                                                                                    Past Surgical History Past Surgical History:  Procedure Laterality Date  . APPENDECTOMY    . BACK SURGERY     18 back surgeries (2 thoracic & 16 lumbar) (01/31/2016)  . DILATION AND CURETTAGE OF UTERUS    . HAMMER TOE SURGERY    . IR RADIOLOGIST EVAL & MGMT  07/21/2017  . JOINT REPLACEMENT    . KNEE ARTHROSCOPY Right   . LAPAROSCOPIC CHOLECYSTECTOMY    . LUMBAR FUSION N/A 08/01/2015   Procedure: Right sided L1-2 and L2-3  transforaminal lumbar interbody fusion with cages, Extension of posterior fusion T12 to L3, Replaced pedicle screws bilaterally L1-L2 , Replaced left sided pedicle screws L-3. Instrumentation T12 to L3 using local bone graft, Vivigen allograft and cancellous chips;  Surgeon: [name], MD;  Location: [facility];  Service: Orthopedics;  Laterality: N/A;  . LUMBAR LAMINECTOMY/DECOMPRESSION MICRODISCECTOMY N/A 01/28/2014   Procedure: Minimally Invasive Right  L1-2 Microdiscectomy;  Surgeon: [name], MD;  Location: [facility];  Service: Orthopedics;  Laterality: N/A;  . TOTAL HIP ARTHROPLASTY Right   . TOTAL KNEE ARTHROPLASTY  05/29/2012   Procedure: TOTAL KNEE ARTHROPLASTY;  Surgeon: Mcarthur Rossetti, MD;  Location: [facility];  Service: Orthopedics;  Laterality: Right;  Right Total Knee Arthroplasty  . TUBAL LIGATION    . UVULOPALATOPHARYNGOPLASTY    . VAGINAL HYSTERECTOMY     Family History Family History  Problem Relation Age of Onset  . Kidney disease Mother   . Heart disease Father   . Anuerysm Brother 29       brain  . Heart disease Brother   . Heart disease Sister 64       s/p CABG  . Hypertension Sister   . Colon cancer Neg Hx     Social History Social History   Tobacco Use  . Smoking status: Current Every Day Smoker    Packs/day: 1.25    Years: 45.00    Pack years: 56.25    Types: Cigarettes  . Smokeless tobacco: Never Used  . Tobacco comment: form given 12/25/16  Substance Use Topics  . Alcohol use: No    Alcohol/week: 0.0 standard drinks  . Drug use: No    Types: Marijuana    Comment: 01/31/2016 "none since ~ 1980"   Allergies Flagyl [metronidazole]; Limonene; Sulfa antibiotics; Doxycycline; Amoxicillin; Chlorzoxazone; Codeine; Darvocet [propoxyphene n-acetaminophen]; Dilaudid [hydromorphone hcl]; Keflex [cephalexin]; Morphine and related; Nitrofurantoin monohyd macro; and Percocet [oxycodone-acetaminophen]  Review of Systems Review of Systems All other systems  are reviewed and are negative for acute change except as noted in the HPI  Physical Exam Vital Signs  I have reviewed the triage vital signs BP 123/80   Pulse 63   Temp 98.3 F (36.8 C) (Oral)   Resp 10   SpO2 91%   Physical Exam Vitals signs reviewed.  Constitutional:      General: She is not in acute distress.    Appearance: She is well-developed. She is not diaphoretic.  HENT:     Head: Normocephalic and atraumatic.     Comments: TTP to left cheek with mild periorbital echymosis.     Right Ear: External ear normal.     Left Ear: External ear normal.     Nose: Nose normal.     Mouth/Throat:     Mouth: Mucous membranes are dry.     Comments: Upper dentures Eyes:     General: No scleral icterus.       Right eye: No discharge.        Left eye: No discharge.     Extraocular Movements: Extraocular movements intact.     Conjunctiva/sclera: Conjunctivae normal.     Pupils: Pupils are equal, round, and reactive to light.  Neck:     Musculoskeletal: Normal range of motion and neck supple.  Cardiovascular:     Rate and Rhythm: Normal rate and regular rhythm.     Pulses:          Radial pulses are 2+ on the right side and 2+ on the left side.       Dorsalis pedis pulses are 2+ on the right side and 2+ on the left side.     Heart sounds: Normal heart sounds. No murmur. No friction rub. No gallop.   Pulmonary:     Effort: Pulmonary effort is normal. No respiratory distress.     Breath sounds: Normal breath sounds. No stridor. No wheezing or rales.  Abdominal:     General: There is no distension.     Palpations: Abdomen is soft.     Tenderness: There is no abdominal tenderness.  Musculoskeletal:        General: No tenderness.     Left knee: She exhibits ecchymosis. No tenderness found.     Cervical back: She exhibits no bony tenderness.     Thoracic back: She exhibits no bony tenderness.     Lumbar back: She exhibits no bony tenderness.       Legs:     Comments: Clavicles  stable. Chest stable to AP/Lat compression. Pelvis stable to Lat compression. No obvious extremity deformity. No chest or abdominal wall contusion.  Skin:    General: Skin is warm and dry.     Findings: No erythema or rash.  Neurological:     Mental Status: She is alert and oriented to person, place, and time.     Comments: Moving all extremities     ED Results and Treatments Labs (all labs ordered are listed, but only abnormal results are displayed) Labs Reviewed  COMPREHENSIVE METABOLIC PANEL -  Abnormal; Notable for the following components:      Result Value   Glucose, Bld 123 (*)    Creatinine, Ser 1.15 (*)    GFR calc non Af Amer 51 (*)    GFR calc Af Amer 59 (*)    All other components within normal limits  CBG MONITORING, ED - Abnormal; Notable for the following components:   Glucose-Capillary 107 (*)    All other components within normal limits  CBC WITH DIFFERENTIAL/PLATELET  URINALYSIS, ROUTINE W REFLEX MICROSCOPIC                                                                                                                         EKG  EKG Interpretation  Date/Time:  Wednesday December 02 2018 23:38:40 EDT Ventricular Rate:  67 PR Interval:    QRS Duration: 97 QT Interval:  403 QTC Calculation: 426 R Axis:   53 Text Interpretation:  Sinus rhythm No significant change since last tracing Confirmed by Addison Lank [phone]) on 12/03/2018 12:11:56 AM      Radiology No results found. Pertinent labs & imaging results that were available during my care of the patient were reviewed by me and considered in my medical decision making (see chart for details).  Medications Ordered in ED Medications  sodium chloride 0.9 % bolus 1,000 mL (1,000 mLs Intravenous New Bag/Given 12/03/18 0036)    And  0.9 %  sodium chloride infusion ( Intravenous New Bag/Given 12/03/18 0037)                                                                                                                                     Procedures Procedures  (including critical care time)  Medical Decision Making / ED Course I have reviewed the nursing notes for this encounter and the patient's prior records (if available in EHR or on provided paperwork).    Patient has syncopal episode in the setting of orthostasis.  Likely dehydration with superimposed medication side effect from polypharmacy.  EKG is nonischemic, without bradycardia or blocks.  No acute changes from prior. Doubt cardiac etiology. Labs reassuring without anemia, significant electrolyte derangements.  There is mild but stable renal insufficiency.  Orthostatics were positive.  Patient was provided with IV fluids and allowed to hydrate orally as well.  After 1 L of IV fluid hydration, patient improved significantly.  Orthostatics improved and patient was able  to ambulate without complication to the bathroom.  The patient appears reasonably screened and/or stabilized for discharge and I doubt any other medical condition or other [facility] requiring further screening, evaluation, or treatment in the ED at this time prior to discharge.  The patient is safe for discharge with strict return precautions.   Final Clinical Impression(s) / ED Diagnoses Final diagnoses:  Orthostasis   Disposition: Discharge  Condition: Good  I have discussed the results, Dx and Tx plan with the patient who expressed understanding and agree(s) with the plan. Discharge instructions discussed at great length. The patient was given strict return precautions who verbalized understanding of the instructions. No further questions at time of discharge.    ED Discharge Orders    None       Follow Up: [name], MD [place] [address] [phone]  Schedule an appointment as soon as possible for a visit  As needed      This chart was dictated using voice recognition software.  Despite best efforts to proofread,  errors can  occur which can change the documentation meaning.   Fatima Blank, MD 12/03/18 [phone]

## 2018-12-03 DIAGNOSIS — I951 Orthostatic hypotension: Secondary | ICD-10-CM | POA: Diagnosis not present

## 2018-12-03 LAB — COMPREHENSIVE METABOLIC PANEL
ALT: 40 U/L (ref 0–44)
AST: 33 U/L (ref 15–41)
Albumin: 4.5 g/dL (ref 3.5–5.0)
Alkaline Phosphatase: 126 U/L (ref 38–126)
Anion gap: 11 (ref 5–15)
BUN: 20 mg/dL (ref 8–23)
CO2: 24 mmol/L (ref 22–32)
Calcium: 9 mg/dL (ref 8.9–10.3)
Chloride: 104 mmol/L (ref 98–111)
Creatinine, Ser: 1.15 mg/dL — ABNORMAL HIGH (ref 0.44–1.00)
GFR calc Af Amer: 59 mL/min — ABNORMAL LOW (ref >60)
GFR calc non Af Amer: 51 mL/min — ABNORMAL LOW (ref >60)
Glucose, Bld: 123 mg/dL — ABNORMAL HIGH (ref 70–99)
Potassium: 3.7 mmol/L (ref 3.5–5.1)
Sodium: 139 mmol/L (ref 135–145)
Total Bilirubin: 0.5 mg/dL (ref 0.3–1.2)
Total Protein: 8 g/dL (ref 6.5–8.1)

## 2018-12-03 LAB — URINALYSIS, ROUTINE W REFLEX MICROSCOPIC
Bilirubin Urine: NEGATIVE
Glucose, UA: NEGATIVE mg/dL
Hgb urine dipstick: NEGATIVE
Ketones, ur: NEGATIVE mg/dL
Leukocytes,Ua: NEGATIVE
Nitrite: NEGATIVE
Protein, ur: NEGATIVE mg/dL
Specific Gravity, Urine: 1.008 (ref 1.005–1.030)
pH: 6 (ref 5.0–8.0)

## 2018-12-03 LAB — CBC WITH DIFFERENTIAL/PLATELET
Abs Immature Granulocytes: 0.07 10*3/uL (ref 0.00–0.07)
Basophils Absolute: 0 10*3/uL (ref 0.0–0.1)
Basophils Relative: 0 %
Eosinophils Absolute: 0 10*3/uL (ref 0.0–0.5)
Eosinophils Relative: 0 %
HCT: 45.9 % (ref 36.0–46.0)
Hemoglobin: 14.6 g/dL (ref 12.0–15.0)
Immature Granulocytes: 1 %
Lymphocytes Relative: 17 %
Lymphs Abs: 1.7 10*3/uL (ref 0.7–4.0)
MCH: 30.4 pg (ref 26.0–34.0)
MCHC: 31.8 g/dL (ref 30.0–36.0)
MCV: 95.4 fL (ref 80.0–100.0)
Monocytes Absolute: 0.4 10*3/uL (ref 0.1–1.0)
Monocytes Relative: 4 %
Neutro Abs: 7.5 10*3/uL (ref 1.7–7.7)
Neutrophils Relative %: 78 %
Platelets: 268 10*3/uL (ref 150–400)
RBC: 4.81 MIL/uL (ref 3.87–5.11)
RDW: 14.6 % (ref 11.5–15.5)
WBC: 9.6 10*3/uL (ref 4.0–10.5)
nRBC: 0 % (ref 0.0–0.2)

## 2018-12-03 LAB — CBG MONITORING, ED: Glucose-Capillary: 107 mg/dL — ABNORMAL HIGH (ref 70–99)

## 2018-12-03 NOTE — ED Notes (Signed)
Pt aware that a urine sample is needed.  Pt sts, "what, why does he want urine, I hope he doesn't think I take drugs or use alcohol."  Pt nods out and then wakes and sts, "my mouth is dry I need water"  RN notified.

## 2018-12-03 NOTE — ED Notes (Signed)
Pt given ham sandwich and ginger ale 

## 2018-12-04 ENCOUNTER — Ambulatory Visit: Payer: Medicare Other | Admitting: Internal Medicine

## 2018-12-08 ENCOUNTER — Ambulatory Visit: Payer: Self-pay | Admitting: Internal Medicine

## 2018-12-08 ENCOUNTER — Encounter: Payer: Self-pay | Admitting: Internal Medicine

## 2018-12-08 ENCOUNTER — Ambulatory Visit (INDEPENDENT_AMBULATORY_CARE_PROVIDER_SITE_OTHER): Payer: Medicare Other | Admitting: Internal Medicine

## 2018-12-08 DIAGNOSIS — R519 Headache, unspecified: Secondary | ICD-10-CM | POA: Insufficient documentation

## 2018-12-08 DIAGNOSIS — S0993XD Unspecified injury of face, subsequent encounter: Secondary | ICD-10-CM | POA: Insufficient documentation

## 2018-12-08 DIAGNOSIS — I10 Essential (primary) hypertension: Secondary | ICD-10-CM

## 2018-12-08 DIAGNOSIS — G44309 Post-traumatic headache, unspecified, not intractable: Secondary | ICD-10-CM

## 2018-12-08 DIAGNOSIS — R55 Syncope and collapse: Secondary | ICD-10-CM | POA: Diagnosis not present

## 2018-12-08 DIAGNOSIS — R51 Headache: Secondary | ICD-10-CM

## 2018-12-08 NOTE — Progress Notes (Signed)
Virtual Visit via Video Note  I connected with Jill Phillips on 12/08/18 at 10:30 AM EDT by a video enabled telemedicine application and verified that I am speaking with the correct person using two identifiers.   I discussed the limitations of evaluation and management by telemedicine and the availability of in person appointments. The patient expressed understanding and agreed to proceed.  The patient is currently at home and I am in the office.    No referring provider.    History of Present Illness: This visit is for an acute issue: Fall and LOC 4/1, hit her head.  She went to the emergency room 4/1 after falling at home and injuring her face.  She had taken her nightly medications as usual.  She was in bed but had not fallen asleep yet and went to the restroom, but when she stood up out of bed she felt lightheaded and passed out.  She is unsure how long she was unconscious.  She hit the left side of her face on the floor and was complaining of pain in the ED.  She denied chest pain and shortness of breath.  She denied fevers.  She did states she was not hydrating well and has only been drinking coffee.  She denied neck pain, back pain, new extremity pain other than her chronic pain.  EMS noted that she was orthostatic on their assessment and removed her fentanyl patch.  On exam she had tenderness to palpation of the left cheek with mild periorbital ecchymosis.  Her mucous membranes were dry.  She had ecchymosis anterior left knee.  The remainder of her exam was normal.  Her EKG was nonischemic and without bradycardia or blocks.  Her labs were unremarkable.  Orthostatics were positive.  She received fluids.  It was thought that her syncopal episode was related to orthostasis.  Likely dehydration with superimposed medication side effect from polypharmacy.  After 1 L of IVF she improved.  She was ambulating independently.  Since being home she has had pain on the left cheek that she feels is  radiating to the rest of the left side of her head.  She has been having headaches.  If she takes 3 regular strength Tylenol and half of a muscle relaxer it does help with the headaches and allows her to relax.  If she sleeps the headaches resolved.  Turning her head and moving around makes her facial and head pain worse.  She denies any dizziness, lightheadedness, blurry vision or changes in vision and nausea.  Her headaches have been getting better over the past week.  When she fell she also fell on the left side of her body and has increased pain in her left arm and shoulder, left knee and shin.  She has been checking her blood pressure and it has been higher recently more recent numbers she got home was 163/93.  There have been no low numbers.   Observations/Objective: Appears in no acute distress. For scratch marks on the lateral edge of her left eye from where her glasses hit her face when she fell.  Mild swelling left cheek  No imaging done in ED  Lab Results  Component Value Date   WBC 9.6 12/03/2018   HGB 14.6 12/03/2018   HCT 45.9 12/03/2018   PLT 268 12/03/2018   GLUCOSE 123 (H) 12/03/2018   CHOL 166 11/03/2018   TRIG 158.0 (H) 11/03/2018   HDL 47.60 11/03/2018   LDLDIRECT 120.0 06/04/2018   LDLCALC  86 11/03/2018   ALT 40 12/03/2018   AST 33 12/03/2018   NA 139 12/03/2018   K 3.7 12/03/2018   CL 104 12/03/2018   CREATININE 1.15 (H) 12/03/2018   BUN 20 12/03/2018   CO2 24 12/03/2018   TSH 1.10 11/03/2018   INR 0.87 05/26/2012   HGBA1C 6.3 11/03/2018    Assessment and Plan:  See Problem List for Assessment and Plan of chronic medical problems.   Follow Up Instructions:    I discussed the assessment and treatment plan with the patient. The patient was provided an opportunity to ask questions and all were answered. The patient agreed with the plan and demonstrated an understanding of the instructions.   The patient was advised to call back or seek an in-person  evaluation if the symptoms worsen or if the condition fails to improve as anticipated.    [name], MD

## 2018-12-08 NOTE — Assessment & Plan Note (Signed)
4/1 fell on left side of face-tenderness left cheek and left-sided headaches Was evaluated in the ED and no imaging was felt to be necessary at that time Her pain has improved over the last week We will hold off on any imaging at this time unless her pain does not continue to improve Symptomatic treatment

## 2018-12-08 NOTE — Telephone Encounter (Signed)
Virtual at 10:30.

## 2018-12-08 NOTE — Telephone Encounter (Signed)
Patient scheduled for virtual visit today.  

## 2018-12-08 NOTE — Assessment & Plan Note (Signed)
Having headaches since she fell 4/1 Headaches are on the left side of her head and she feels they are radiating from her left cheek, which is where she hit when she fell Headaches have improved She denies dizziness, lightheadedness, changes in vision and nausea She is taking 3 regular strength Tylenol and a half of a muscle relaxer and when she sleeps she does not have pain She is already on enough pain medication and is taking Tylenol for her headaches along with a muscle relaxer, which is effective-she will continue this Her headaches are improving and I expect that they will continue to improve Advised her to continue above.  If her headaches worsen or do not continue to improve she was advised to call immediately It has been a week since she fell-no imaging is necessary at this time We both agree we do not want to add any additional medication for chronic headaches since her headaches are improving and are tolerable Advised her to limit her activity as most activities can increase her headaches  She will call with any questions or concerns

## 2018-12-08 NOTE — Assessment & Plan Note (Signed)
BP was on the low side and she was orthostatic when she went to the ED 4/1, but she is monitoring her blood pressure at home and it has actually been on the high side-most recently 163/93 Continue current medications She will continue to monitor, but given her recent orthostatic episode I do not want to make any changes

## 2018-12-08 NOTE — Telephone Encounter (Addendum)
Pt called in c/o a headache.   She fell last Wednesday and hit the left side of her head.  See triage notes.  EMS took her to [facility] ED.   No CT scan done.  Due to the coronavirus pandemic pts are being seen via video chat/phone call.   She is agreeable to this.   She has video chatted with Dr. Quay Burow recently so is familiar with the process.   Phone number and e mail are correct.  I let her know the office would be contacting her within 2 hrs to set up a video visit.  I sent these notes to Dr Quay Burow for further disposition. I called the [facility] and spoke with Gareth Eagle and made her aware of the triage I sent over.  Reason for Disposition . [1] New headache AND [2] age > 11  Answer Assessment - Initial Assessment Questions 1. LOCATION: "Where does it hurt?"      I fainted the other night.   I was standing up when it happened.   I hit everything on the left side of my body.   I hit the side of my head on the side of my head side of my left eye.   My glasses broke.   The pain goes to my cheek.  No bruises. I had gotten up to go to the bathroom and was unsteady.   I don't remember falling.   But I woke up after I fell.     I called EMS and took me to [facility] hospital.   They said I was dehydrated and low BP. 2. ONSET: "When did the headache start?" (Minutes, hours or days)      The headache mostly hurts on the left and top of my head since my fall on Wednesday night.   My right knee is sore from the fall.   I don't drink enough water is why it happened.  3. PATTERN: "Does the pain come and go, or has it been constant since it started?"     If it gets bad I take 1/2 of my muscle relaxer.   Also Tylenol.   It starts hurting after I get up and become active. 4. SEVERITY: "How bad is the pain?" and "What does it keep you from doing?"  (e.g., Scale 1-10; mild, moderate, or severe)   - MILD (1-3): doesn't interfere with normal activities    - MODERATE (4-7): interferes with normal activities or  awakens from sleep    - SEVERE (8-10): excruciating pain, unable to do any normal activities        I go to the pain clinic.   I called them and they told me to call my PCP.   It's a 5 on the pain scale. 5. RECURRENT SYMPTOM: "Have you ever had headaches before?" If so, ask: "When was the last time?" and "What happened that time?"      No 6. CAUSE: "What do you think is causing the headache?"     I think the headache is from my fall.    They didn't do a CT scan while in the ED.   They were more concerned about the dehydration and low BP. 7. MIGRAINE: "Have you been diagnosed with migraine headaches?" If so, ask: "Is this headache similar?"      No 8. HEAD INJURY: "Has there been any recent injury to the head?"      Yes I fell last Wednesday  See above  9. OTHER SYMPTOMS: "Do you have any other symptoms?" (fever, stiff neck, eye pain, sore throat, cold symptoms)     No other changes just the headache. 10. PREGNANCY: "Is there any chance you are pregnant?" "When was your last menstrual period?"       Had hysterectomy  Protocols used: HEADACHE-A-AH

## 2018-12-08 NOTE — Assessment & Plan Note (Signed)
Likely vasovagal in nature 4/1 went to the ED and was found to have a low BP and was orthostatic Improved with fluids She had also taken her nighttime medications, which include sleep medication and was likely dehydrated only drinking coffee all day Blood pressure at home has been actually on the high side-we will not change any medication Advised that she needs to be very careful after taking her sleep medication and getting up, especially with some of her other medications

## 2018-12-09 ENCOUNTER — Encounter: Payer: Self-pay | Admitting: Registered Nurse

## 2018-12-09 ENCOUNTER — Encounter: Payer: Medicare Other | Attending: Physical Medicine & Rehabilitation | Admitting: Registered Nurse

## 2018-12-09 ENCOUNTER — Other Ambulatory Visit: Payer: Self-pay

## 2018-12-09 VITALS — BP 139/78 | HR 94 | Temp 99.2°F | Ht 63.0 in | Wt 150.6 lb

## 2018-12-09 DIAGNOSIS — M2170 Unequal limb length (acquired), unspecified site: Secondary | ICD-10-CM | POA: Insufficient documentation

## 2018-12-09 DIAGNOSIS — Z5181 Encounter for therapeutic drug level monitoring: Secondary | ICD-10-CM | POA: Insufficient documentation

## 2018-12-09 DIAGNOSIS — Z79899 Other long term (current) drug therapy: Secondary | ICD-10-CM | POA: Insufficient documentation

## 2018-12-09 DIAGNOSIS — M75102 Unspecified rotator cuff tear or rupture of left shoulder, not specified as traumatic: Secondary | ICD-10-CM | POA: Diagnosis not present

## 2018-12-09 DIAGNOSIS — M5126 Other intervertebral disc displacement, lumbar region: Secondary | ICD-10-CM | POA: Diagnosis not present

## 2018-12-09 DIAGNOSIS — M170 Bilateral primary osteoarthritis of knee: Secondary | ICD-10-CM | POA: Insufficient documentation

## 2018-12-09 DIAGNOSIS — Z9889 Other specified postprocedural states: Secondary | ICD-10-CM | POA: Diagnosis not present

## 2018-12-09 DIAGNOSIS — I10 Essential (primary) hypertension: Secondary | ICD-10-CM | POA: Insufficient documentation

## 2018-12-09 DIAGNOSIS — Z79891 Long term (current) use of opiate analgesic: Secondary | ICD-10-CM

## 2018-12-09 DIAGNOSIS — Z9981 Dependence on supplemental oxygen: Secondary | ICD-10-CM | POA: Insufficient documentation

## 2018-12-09 DIAGNOSIS — E079 Disorder of thyroid, unspecified: Secondary | ICD-10-CM | POA: Insufficient documentation

## 2018-12-09 DIAGNOSIS — F411 Generalized anxiety disorder: Secondary | ICD-10-CM

## 2018-12-09 DIAGNOSIS — M7918 Myalgia, other site: Secondary | ICD-10-CM

## 2018-12-09 DIAGNOSIS — E039 Hypothyroidism, unspecified: Secondary | ICD-10-CM | POA: Diagnosis not present

## 2018-12-09 DIAGNOSIS — M5136 Other intervertebral disc degeneration, lumbar region: Secondary | ICD-10-CM | POA: Diagnosis not present

## 2018-12-09 DIAGNOSIS — J449 Chronic obstructive pulmonary disease, unspecified: Secondary | ICD-10-CM | POA: Diagnosis not present

## 2018-12-09 DIAGNOSIS — G9619 Other disorders of meninges, not elsewhere classified: Secondary | ICD-10-CM | POA: Diagnosis not present

## 2018-12-09 DIAGNOSIS — G4709 Other insomnia: Secondary | ICD-10-CM

## 2018-12-09 DIAGNOSIS — Z720 Tobacco use: Secondary | ICD-10-CM | POA: Diagnosis not present

## 2018-12-09 DIAGNOSIS — K219 Gastro-esophageal reflux disease without esophagitis: Secondary | ICD-10-CM | POA: Diagnosis not present

## 2018-12-09 DIAGNOSIS — G2581 Restless legs syndrome: Secondary | ICD-10-CM | POA: Diagnosis not present

## 2018-12-09 DIAGNOSIS — G894 Chronic pain syndrome: Secondary | ICD-10-CM | POA: Insufficient documentation

## 2018-12-09 DIAGNOSIS — E785 Hyperlipidemia, unspecified: Secondary | ICD-10-CM | POA: Insufficient documentation

## 2018-12-09 DIAGNOSIS — M62830 Muscle spasm of back: Secondary | ICD-10-CM

## 2018-12-09 DIAGNOSIS — IMO0002 Reserved for concepts with insufficient information to code with codable children: Secondary | ICD-10-CM

## 2018-12-09 DIAGNOSIS — M961 Postlaminectomy syndrome, not elsewhere classified: Secondary | ICD-10-CM | POA: Diagnosis not present

## 2018-12-09 DIAGNOSIS — M4316 Spondylolisthesis, lumbar region: Secondary | ICD-10-CM | POA: Diagnosis not present

## 2018-12-09 MED ORDER — FENTANYL 50 MCG/HR TD PT72: 1.0000 | MEDICATED_PATCH | TRANSDERMAL | 0 refills | Status: DC

## 2018-12-09 MED ORDER — CLONAZEPAM 0.5 MG PO TABS: 0.5000 mg | ORAL_TABLET | Freq: Two times a day (BID) | ORAL | 0 refills | Status: DC | PRN

## 2018-12-09 MED ORDER — GABAPENTIN 300 MG PO CAPS: 300.0000 mg | ORAL_CAPSULE | Freq: Three times a day (TID) | ORAL | 5 refills | Status: DC

## 2018-12-09 MED ORDER — HYDROCODONE-ACETAMINOPHEN 10-325 MG PO TABS: 1.0000 | ORAL_TABLET | Freq: Four times a day (QID) | ORAL | 0 refills | Status: DC | PRN

## 2018-12-09 NOTE — Progress Notes (Signed)
Subjective:    Patient ID: Jill Phillips, female    DOB: 1955/10/21, 63 y.o.   MRN: [id number]  HPI: Jill Phillips is a 63 y.o. female her appointment was changed, due to national recommendations of social distancing due to [place] 19, an audio/video telehealth visit is felt to be most appropriate for this patient at this time.  See Chart message from today for the patient's consent to telehealth from [place].     She states her pain is located in her left shoulder, left arm ( achy pain), lower back pain and right knee pain. She rates her pain 5. Her current exercise regime is walking and performing stretching exercises.  Jill Phillips reports her PCP has been treating her for upper viral infection, she was prescribed antibiotics. She was encouraged to F/U with her PCP she verbalizes understanding. Denies COVID-19 symptoms at this time.   Jill Phillips states she had a fall on 12/02/2018, she states she was walking to the bathroom, had a cup of coffee in her hand, she became light headed and fell forwards.. She went to [facility] for evaluation, note was reviewed. Her medications are being adjusted today gabapentin decreased to 3 times a day and  klonopin decreased to 1.5 mg twice a day. [facility] was called regarding the above. Jill Phillips was instructed to call office on 12/21/2018 for medication evaluation.   Had a long discussion with Jill Phillips regarding her medication regimen, her MME and recent fall, she realizes we will begin a slow weaning of her medication, she verbalizes understanding.   Jill Phillips Morphine equivalent is  160.00MME. She is also prescribed Clonazepam. We have discussed the black box warning of using opioids and benzodiazepines. I highlighted the dangers of using these drugs together and discussed the adverse events including respiratory suppression, overdose, cognitive impairment and importance of compliance with current regimen.  We will continue to monitor and adjust as indicated.   Jill Phillips CMA asked the Health and History Questions. This provider and Jill Phillips verified we were  speaking with the correct person using two identifiers.   Pain Inventory Average Pain 4 Pain Right Now 5 My pain is dull and aching  In the last 24 hours, has pain interfered with the following? General activity 4 Relation with others 6 Enjoyment of life 2 What TIME of day is your pain at its worst? daytime and evening Sleep (in general) Good  Pain is worse with: some activites Pain improves with: rest and medication Relief from Meds: 6  Mobility Do you have any goals in this area?  no  Function Do you have any goals in this area?  no  Neuro/Psych numbness spasms  Prior Studies Any changes since last visit?  no  Physicians involved in your care Primary care Dr. Quay Burow Orthopedist Dr. Ninfa Linden   Family History  Problem Relation Age of Onset  . Kidney disease Mother   . Heart disease Father   . Anuerysm Brother 29       brain  . Heart disease Brother   . Heart disease Sister 69       s/p CABG  . Hypertension Sister   . Colon cancer Neg Hx    Social History   Socioeconomic History  . Marital status: Divorced    Spouse name: n/a  . Number of children: 2  . Years of education: 12+  . Highest education level: Not on file  Occupational  History  . Occupation: disability    Comment: back surgeries  Social Needs  . Financial resource strain: Not on file  . Food insecurity:    Worry: Not on file    Inability: Not on file  . Transportation needs:    Medical: Not on file    Non-medical: Not on file  Tobacco Use  . Smoking status: Current Every Day Smoker    Packs/day: 1.25    Years: 45.00    Pack years: 56.25    Types: Cigarettes  . Smokeless tobacco: Never Used  . Tobacco comment: form given 12/25/16  Substance and Sexual Activity  . Alcohol use: No    Alcohol/week: 0.0 standard drinks  . Drug  use: No    Types: Marijuana    Comment: 01/31/2016 "none since ~ 1980"  . Sexual activity: Never    Partners: Male  Lifestyle  . Physical activity:    Days per week: Not on file    Minutes per session: Not on file  . Stress: Not on file  Relationships  . Social connections:    Talks on phone: Not on file    Gets together: Not on file    Attends religious service: Not on file    Active member of club or organization: Not on file    Attends meetings of clubs or organizations: Not on file    Relationship status: Not on file  Other Topics Concern  . Not on file  Social History Narrative   Lives alone.  One daughter is local, but is getting ready to move to [place], where her children live with their father.  The other daughter lives near [place], [place].   Past Surgical History:  Procedure Laterality Date  . APPENDECTOMY    . BACK SURGERY     18 back surgeries (2 thoracic & 16 lumbar) (01/31/2016)  . DILATION AND CURETTAGE OF UTERUS    . HAMMER TOE SURGERY    . IR RADIOLOGIST EVAL & MGMT  07/21/2017  . JOINT REPLACEMENT    . KNEE ARTHROSCOPY Right   . LAPAROSCOPIC CHOLECYSTECTOMY    . LUMBAR FUSION N/A 08/01/2015   Procedure: Right sided L1-2 and L2-3 transforaminal lumbar interbody fusion with cages, Extension of posterior fusion T12 to L3, Replaced pedicle screws bilaterally L1-L2 , Replaced left sided pedicle screws L-3. Instrumentation T12 to L3 using local bone graft, Vivigen allograft and cancellous chips;  Surgeon: [name], MD;  Location: [place];  Service: Orthopedics;  Laterality: N/A;  . LUMBAR LAMINECTOMY/DECOMPRESSION MICRODISCECTOMY N/A 01/28/2014   Procedure: Minimally Invasive Right  L1-2 Microdiscectomy;  Surgeon: [name], MD;  Location: [facility];  Service: Orthopedics;  Laterality: N/A;  . TOTAL HIP ARTHROPLASTY Right   . TOTAL KNEE ARTHROPLASTY  05/29/2012   Procedure: TOTAL KNEE ARTHROPLASTY;  Surgeon: Mcarthur Rossetti, MD;  Location: [facility];  Service:  Orthopedics;  Laterality: Right;  Right Total Knee Arthroplasty  . TUBAL LIGATION    . UVULOPALATOPHARYNGOPLASTY    . VAGINAL HYSTERECTOMY     Past Medical History:  Diagnosis Date  . Active smoker   . Anxiety   . Calcifying tendinitis of shoulder   . Chronic back pain   . Chronic pain syndrome   . COPD (chronic obstructive pulmonary disease) ([place])   . Dysthymic disorder   . Emphysema lung ([place])   . GERD (gastroesophageal reflux disease)   . Headache    "weekly maybe" (01/31/2016)  . Heart murmur  for years, nothing to be concerned about  . Herpes genitalia   . History of blood transfusion 1980   related to "back surgery"  . Hyperlipidemia   . Hypertension   . Hypothyroidism   . Lumbago   . Osteoarthrosis, unspecified whether generalized or localized, lower leg   . Pain in joint, upper arm   . Pneumothorax, left 01/31/2016   S/P Left posterior subcostal pain injection on 01/30/2016  . PONV (postoperative nausea and vomiting)    gets nauseous  with longer surgery. Difficuty voiding after surgery  . Postlaminectomy syndrome, thoracic region   . Primary localized osteoarthrosis, lower leg   . Restless leg syndrome   . Sleep apnea    s/p surgery- last sleep study 2011- doesnt use oxygen or machine at night as instructed,.   12/2014- Dr Halford Chessman  reports it is negative.  . Thyroid disease    BP (!) 173/99   Pulse 100   Temp 99.3 F (37.4 C)   Ht 5\' 3"  (1.6 m)   Wt 150 lb 9.6 oz (68.3 kg)   SpO2 92%   BMI 26.68 kg/m   Opioid Risk Score:   Fall Risk Score:  `1  Depression screen PHQ 2/9  Depression screen [facility] 2/9 12/09/2018 10/16/2018 09/16/2018 06/22/2018 01/01/2018 12/01/2017 10/10/2017  Decreased Interest 0 0 0 0 0 0 1  Down, Depressed, Hopeless - 0 0 0 0 0 1  PHQ - 2 Score 0 0 0 0 0 0 2  Some recent data might be hidden    Review of Systems  Constitutional: Negative.   HENT: Negative.   Eyes: Negative.   Respiratory: Positive for wheezing.   Cardiovascular: Negative.    Gastrointestinal: Positive for constipation.  Endocrine: Negative.   Genitourinary: Negative.   Musculoskeletal: Negative.   Skin: Negative.   Allergic/Immunologic: Negative.   Neurological: Negative.   Hematological: Negative.   Psychiatric/Behavioral: Negative.   All other systems reviewed and are negative.      Objective:   Physical Exam Vitals signs and nursing note reviewed.  Constitutional:      Appearance: Normal appearance.  Musculoskeletal:     Comments: No Physical Exam: Virtual Visit  Neurological:     Mental Status: She is alert and oriented to person, place, and time.           Assessment & Plan:  1. 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 2. Chronic Thoracic Pain:No complaints Today.Continue current medication regime and Continue to Monitor.12/09/2018 3. Osteoarthritis of left knee:Continue with Heat, exercise and voltaren.12/09/2018 4. Rotator cuff syndrome/Leftsubacromial bursitis:Dr. NiktaFollowing.12/09/2018 5. Restless legs syndrome:Continue with current treatment withRequip.Continue to monitor.12/09/2018 6. Tobacco Abuse:Encourage and Educated onSmoking Cessation: PCP prescribed Nicotine Patches : 12/09/2018 7. Muscle Spasm:Continue current treatment Regimen withTizanidine.12/09/2018 8. Insomnia:Continue current treatment regimen withSeroquel. 12/09/2018. 9. Neuropathic Pain:Decreased Gabapentin to 3 times a day. [name]04/04/2019 10.Anxiety:DecreasedKlonopin to 1.5 mg BID. Continue to monitor. [name] 12/09/2018 11 Chronic Rectal Pain: No complaints today.Continue  Pelvic Floor Therapy.Dr. [name] Following.12/09/2018.  30 minutes of face to face patient care time was spent during this visit. All questions were encouraged and answered.  F/u in 1 month

## 2018-12-09 NOTE — Patient Instructions (Addendum)
Began a slow weaning of Klonopin:  Start Today  Decrease  Klonopin 2 mg tablet:  Only take a half tablet in the morning and in the evening.  I will prescribe 0.5 mg, to be taken with the Klonopin 1/2 tablet 1mg ) to equal  Klonopin to = 1.5 mg  Spoke with [employer] regarding the Above   We will Also Decrease yor Gababapentin: Previous Prescription remove from her Profile: at [facility].   Gabapentin: will be decreased to three times a day    Call Office on 12/21/2018: To Discuss Medication Changes

## 2018-12-21 ENCOUNTER — Telehealth: Payer: Self-pay | Admitting: *Deleted

## 2018-12-21 ENCOUNTER — Other Ambulatory Visit: Payer: Self-pay | Admitting: Registered Nurse

## 2018-12-21 NOTE — Telephone Encounter (Signed)
Medha called and is questioning why her klonopin was cut to 0.5 mg bid.  She is already having withdrawals and her legs are feeling it.

## 2018-12-21 NOTE — Telephone Encounter (Signed)
This provider called Jill Phillips at noon regarding her clonazepam, we reviewed the instructions she was given on 12/09/2018. She states she was taking her Clonazepam  0.5 mg tablet in the morning and the Clonazepam 2 mg in the evening. She did not follow the instructions that was given to her on 12/09/2018.  I also had  spoken with the pharmacist from [facility] on 12/09/2018 and today. Clonazepam was filled on 11/24/2018, she was seen on 12/09/2018, Ms. Koller should have 30 tablets of Clonazepam 2mg  tablets and was instructed to take 1/2 tablet ( 1 mg) and this provider prescribe 0.5 mg to equal 1.5 mg twice day . Her current prescription of Clonazepam 2 mg should last until 01/08/2019.  This provider asked Ms. Sandhu to count her Clonazepam 2 mg, she states she hasn't found her bottle. I let Ms. Dunton know I will call her after 1:00 she verbalizes understanding. I placed another call to [name], at 1:00 she reports she has her  Clonazepam 0.5mg  bottle, she has  5 tablets, I asked her how many Clonazepam 2 mg tablets she has, at first she stated she is out of the medication. I reiterated her Clonazepam 2 mg was filled on 11/24/2018, she should not be out of this medication. [name] She was instructed to call office once she finds her Clonazepam 2 mg bottle, she verbalizes understanding.   The reason Ms. Hefel medication was decreased was due to recent fall, she was evaluated at [facility] on 12/02/2018, note was reviewed.  The goal will be to wean her completely off the Clonazepam, this will be discussed with Dr. Naaman Plummer, also this telephone call will be routed to Dr. Naaman Plummer.

## 2018-12-24 ENCOUNTER — Telehealth: Payer: Self-pay | Admitting: Registered Nurse

## 2018-12-24 MED ORDER — CLONAZEPAM 0.5 MG PO TABS: 0.5000 mg | ORAL_TABLET | Freq: Every evening | ORAL | 0 refills | Status: DC | PRN

## 2018-12-24 NOTE — Telephone Encounter (Signed)
This provider spoke with Dr. Naaman Plummer on 12/23/2018 regarding [name] Clonazepam. [name] called office on 12/22/2018 stating she can't find the remainder of her Clonazepam 2mg  tablets. I spoke with [place] Pharmacist she should had enough clonazepam to last till 01/08/2019. Dr. Naaman Plummer was in agreement with weaning  the Clonazepam.  This provider placed a call to [name] regarding the above. [name] states" she believes her clonazepam 2 mg tablets was in her pill container, which is a different story from 12/21/18. Reviewed with [name] the narcotic policy and her responsibility of keeping her medications in a safe place. She verbalizes understanding and states. She has been prescribed Clonazepam since 1993, she became very argumentative". She wanted to speak with Dr. Naaman Plummer regarding being weaned off the clonazepam. I placed a call to [facility] and spoke with pharmacist he reports she has been their customer since 2011, and on her medication profile she's  prescribed Clonazepam. [name] reports she is taking the clonazepam for restless legs, reviewed her medication profile with her and try to explain to her we are  Prescribing her Requip for her restless leg. She became adamant  Stating "her Clonazepam is for her restless legs and began to instruct this provider on the usages of Clonazepam" .[name] reports she was taking her Clonazepam 0.5 mg at [facility], she notice she was running low on her tablets she began to take it every other day. According to the original prescription she should have been out of her Clonazepam on 12/21/2018. This provider will prescribe Clonazepam 0.5 mg at [facility], she remains argumentative, her May appointment was changed to Dr. Naaman Plummer. She is aware of her appointment change and verbalizes understanding.

## 2018-12-29 ENCOUNTER — Telehealth: Payer: Self-pay

## 2018-12-29 MED ORDER — CLONAZEPAM 1 MG PO TABS: 1.0000 mg | ORAL_TABLET | Freq: Two times a day (BID) | ORAL | 0 refills | Status: DC | PRN

## 2018-12-29 NOTE — Telephone Encounter (Signed)
This provider was in contact with Dr. Naaman Plummer regarding Ms. Yamin reports of withdrawal of Klonopin. Reviewed with Ms. Armbrister the previous calls regarding her Klonopin and her reports of her Klonopin 2 mg tablets are missing. Dr. Naaman Plummer is in agreement with increasing her Klonopin to 1 mg BID. According to the PMP , Klonopin was filled on 12/24/2018, she repots she has 26 tablets. She was instructed to take two tablets of her Clonazepam tonight to equal 1 mg. Also  Ms. Onorato was given permission to increase her Clonazepam to 1 mg BID  ( taking two tablets BID), she verbalizes understanding. She realizes we will not be resuming  her Clonazepam 2 mg tablets she verbalizes understanding. We also discuss her being weaned off her medications or she can choose to go to another practice due to her discrepancies. She verbalizes understanding. Her scheduled  appointment  Will be 01/05/2019, she verbalizes understanding.

## 2018-12-29 NOTE — Telephone Encounter (Signed)
Patient called about her Klonopin, she states "don't understand why it was at 4 mg and now 0.5mg , going through horrible withdrawals like crying and legs hurt. She is requesting Dr. Naaman Plummer call and explain.

## 2019-01-04 ENCOUNTER — Ambulatory Visit (INDEPENDENT_AMBULATORY_CARE_PROVIDER_SITE_OTHER): Payer: Medicare Other | Admitting: Specialist

## 2019-01-04 ENCOUNTER — Ambulatory Visit: Payer: Medicare Other

## 2019-01-04 ENCOUNTER — Telehealth: Payer: Self-pay | Admitting: *Deleted

## 2019-01-04 ENCOUNTER — Encounter: Payer: Self-pay | Admitting: Specialist

## 2019-01-04 ENCOUNTER — Other Ambulatory Visit: Payer: Self-pay

## 2019-01-04 VITALS — BP 137/88 | HR 92 | Ht 63.0 in | Wt 151.0 lb

## 2019-01-04 DIAGNOSIS — R1010 Upper abdominal pain, unspecified: Secondary | ICD-10-CM | POA: Diagnosis not present

## 2019-01-04 DIAGNOSIS — M19012 Primary osteoarthritis, left shoulder: Secondary | ICD-10-CM | POA: Diagnosis not present

## 2019-01-04 DIAGNOSIS — M792 Neuralgia and neuritis, unspecified: Secondary | ICD-10-CM

## 2019-01-04 DIAGNOSIS — G8929 Other chronic pain: Secondary | ICD-10-CM

## 2019-01-04 DIAGNOSIS — M25512 Pain in left shoulder: Secondary | ICD-10-CM

## 2019-01-04 DIAGNOSIS — M4325 Fusion of spine, thoracolumbar region: Secondary | ICD-10-CM

## 2019-01-04 DIAGNOSIS — M5414 Radiculopathy, thoracic region: Secondary | ICD-10-CM | POA: Diagnosis not present

## 2019-01-04 NOTE — Telephone Encounter (Signed)
Return Jill Phillips call, we reviewed her Clonazepam she verbalizes understanding.

## 2019-01-04 NOTE — Patient Instructions (Signed)
Avoid frequent bending and stooping  No lifting greater than 10 lbs. May use ice or moist heat for pain. Weight loss is of benefit. Best medication for lumbar disc disease is arthritis medications but you can not take these and require other pain medications to relieve your discomfort. Exercise is important to improve your indurance and does allow people to function better inspite of back pain. MRI  Of the thoracic spine is ordered to assess of any sign of cord compression above the fusion site. Neurology evaluation to assess for left UE radiculopathy or peripheral neuropathy.

## 2019-01-04 NOTE — Telephone Encounter (Signed)
Jill Phillips says you asked her to call you--she doesn't remember why.

## 2019-01-04 NOTE — Progress Notes (Addendum)
Office Visit Note   Patient: Jill Phillips           Date of Birth: [birth year]           MRN: [id number] Visit Date: 01/04/2019              Requested by: [name], MD [place], [place] [address] PCP: [name], MD   Assessment & Plan: Visit Diagnoses:  1. Pain localized to upper abdomen   2. Neuropathy, thoracic (radicular)   3. Chronic left shoulder pain   4. Primary osteoarthritis, left shoulder   5. Radicular pain in left arm   6. Fusion of spine, thoracolumbar region     Plan: Avoid frequent bending and stooping  No lifting greater than 10 lbs. May use ice or moist heat for pain. Weight loss is of benefit. Best medication for lumbar disc disease is arthritis medications but you can not take these and require other pain medications to relieve your discomfort. Exercise is important to improve your indurance and does allow people to function better inspite of back pain. MRI  Of the thoracic spine is ordered to assess of any sign of cord compression above the fusion site. Neurology evaluation to assess for left UE radiculopathy or peripheral neuropathy.    Follow-Up Instructions: Return in about 4 weeks (around 02/01/2019).   Orders:  Orders Placed This Encounter  Procedures  . XR Thoracic Spine 2 View  . Ambulatory referral to Neurology   No orders of the defined types were placed in this encounter.     Procedures: No procedures performed   Clinical Data: No additional findings.   Subjective: Chief Complaint  Patient presents with  . Left Shoulder - Follow-up    63 year old female with history of chronic pain due to multiple joint arthrosis, she has a thoracolumbar fusion from T10 to S1. She experiences right upper abdomenal pain and swelling with bloating of the right upper quadrant and an anterior upper abdomenal  prominence. She is concerned about the decreasing amounts of muscle relaxers klonopin to 0.5mg . She is considering  finding another pain management as she can not tolerate the decrease in her medication. Her primary care MD does not want her using voltaren get due to CKD stage2 dysfunction last Creatinine was 1.15 with GFR 51. She is having right upper abdomenal pain and notices the swelling Of the right abdomenal wall. She relates the swelling and prominence of the right upper quadrant has been present since a right thoracotomy done at [facility] in 1998 for a thoracolumbar disc herniation. This surgery was done by Dr. Danne Harbor and she has had right abdomenal swelling since then. Eventurally she had a T11-12 fusion by Dr. Patrice Paradise and more recently the extension of her fusion to the T10 level. No bowel or bladder difficulty except for constipation. Complains of left shoulder pain and inability to raise the left arm and shoulder to reach the table next to her bed to grab a glass of water. Severe pain with lying on the left shoulder. Pain with trying to lift the left arm. Pain radiates down into the left thumb side of the hand. 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 Saw Dr. Marlou Sa and he believes she may need a reverse shoulder arthroplasty to gain relief from this pain.    Review of Systems  Constitutional: Negative  for activity change, appetite change, chills, diaphoresis, fatigue, fever and unexpected weight change.  HENT: Positive for rhinorrhea, sinus pressure, sinus pain and sneezing. Negative for congestion, dental problem, drooling, ear discharge, ear pain, facial swelling, hearing loss, sore throat, tinnitus, trouble swallowing and voice change.   Eyes: Negative.   Respiratory: Positive for cough. Negative for apnea, choking, chest tightness, shortness of breath, wheezing and stridor.   Cardiovascular: Negative for chest pain, palpitations and leg swelling.  Gastrointestinal: Positive for abdominal distention and  abdominal pain. Negative for anal bleeding, blood in stool, constipation, diarrhea, nausea, rectal pain and vomiting.  Endocrine: Negative.   Genitourinary: Negative.   Musculoskeletal: Positive for arthralgias, back pain, gait problem, joint swelling and neck pain. Negative for myalgias and neck stiffness.  Skin: Negative for color change, pallor, rash and wound.  Allergic/Immunologic: Negative for environmental allergies, food allergies and immunocompromised state.  Neurological: Positive for numbness (right foot post multiple fusions.). Negative for dizziness, tremors, seizures, syncope, facial asymmetry, speech difficulty, weakness, light-headedness and headaches.  Hematological: Negative for adenopathy. Does not bruise/bleed easily.  Psychiatric/Behavioral: Negative for agitation, behavioral problems, confusion, decreased concentration, dysphoric mood, hallucinations, self-injury, sleep disturbance and suicidal ideas. The patient is not nervous/anxious and is not hyperactive.      Objective: Vital Signs: BP 137/88 (BP Location: Left Arm, Patient Position: Sitting)   Pulse 92   Ht 5\' 3"  (1.6 m)   Wt 151 lb (68.5 kg)   BMI 26.75 kg/m   Physical Exam Constitutional:      Appearance: She is well-developed.  HENT:     Head: Normocephalic and atraumatic.  Eyes:     Pupils: Pupils are equal, round, and reactive to light.  Neck:     Musculoskeletal: Normal range of motion and neck supple.  Pulmonary:     Effort: Pulmonary effort is normal.     Breath sounds: Normal breath sounds.  Abdominal:     General: Bowel sounds are normal.     Palpations: Abdomen is soft.  Skin:    General: Skin is warm and dry.  Neurological:     Mental Status: She is alert and oriented to person, place, and time.  Psychiatric:        Behavior: Behavior normal.        Thought Content: Thought content normal.        Judgment: Judgment normal.     Back Exam   Tenderness  The patient is experiencing  tenderness in the lumbar and thoracic.  Range of Motion  Extension: abnormal  Flexion: abnormal  Lateral bend right: abnormal  Lateral bend left: abnormal  Rotation right: abnormal  Rotation left: abnormal   Muscle Strength  Right Quadriceps:  5/5  Left Quadriceps:  5/5  Right Hamstrings:  5/5  Left Hamstrings:  5/5   Tests  Straight leg raise right: negative Straight leg raise left: negative  Reflexes  Patellar: 0/4 Achilles: 0/4 Babinski's sign: normal   Other  Toe walk: normal Heel walk: normal Sensation: decreased Gait: abnormal   Comments:  Absent right ankle 0/2+, absent left knee reflex. Right knee reflex is 3+. No clonus. Weak right upper abdomenal motor to scratch testing       Specialty Comments:  No specialty comments available.  Imaging: No results found.   PMFS History: Patient Active Problem List   Diagnosis Date Noted  . Degenerative disc disease, lumbar 08/01/2015    Priority: High    Class: Chronic  . Spondylolisthesis of lumbar region  08/01/2015    Priority: High    Class: Chronic  . Facial trauma, subsequent encounter 12/08/2018  . Headache 12/08/2018  . Syncope 12/08/2018  . COPD with exacerbation ([place]) 11/26/2018  . Acute bronchitis 11/03/2018  . Disorder of rotator cuff syndrome of left shoulder and allied disorder 06/22/2018  . Leg edema, left 06/04/2018  . Type 2 diabetes mellitus without complication, without long-term current use of insulin ([place]) 12/02/2017  . Sacral pain 09/17/2017  . COPD (chronic obstructive pulmonary disease) ([place]) 04/21/2017  . Lower back pain 03/03/2017  . CKD (chronic kidney disease) stage 3, GFR 30-59 ml/min (HCC) 08/07/2016  . GERD (gastroesophageal reflux disease) 08/07/2016  . Chronic respiratory failure ([place]) 07/29/2016  . Hypersomnia 07/29/2016  . Arthritis of carpometacarpal [facility]) joint of left thumb 07/09/2016  . Pneumothorax on left 01/31/2016  . Chronic, continuous use of opioids  09/06/2015  . Urinary, incontinence, stress female 05/06/2014  . Constipation 05/05/2014  . 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 HTN (hypertension) 05/19/2013  . HLD (hyperlipidemia) 05/19/2013  . Depression 05/19/2013  . Hypothyroidism 05/19/2013  . Tobacco abuse   . Osteoarthritis of both knees 11/18/2011  . RLS (restless legs syndrome) 11/18/2011   Past Medical History:  Diagnosis Date  . Active smoker   . Anxiety   . Calcifying tendinitis of shoulder   . Chronic back pain   . Chronic pain syndrome   . COPD (chronic obstructive pulmonary disease) ([place])   . Dysthymic disorder   . Emphysema lung ([place])   . GERD (gastroesophageal reflux disease)   . Headache    "weekly maybe" (01/31/2016)  . Heart murmur    for years, nothing to be concerned about  . Herpes genitalia   . History of blood transfusion 1980   related to "back surgery"  . Hyperlipidemia   . Hypertension   . Hypothyroidism   . Lumbago   . Osteoarthrosis, unspecified whether generalized or localized, lower leg   . Pain in joint, upper arm   . Pneumothorax, left 01/31/2016   S/P Left posterior subcostal pain injection on 01/30/2016  . PONV (postoperative nausea and vomiting)    gets nauseous  with longer surgery. Difficuty voiding after surgery  . Postlaminectomy syndrome, thoracic region   . Primary localized osteoarthrosis, lower leg   . Restless leg syndrome   . Sleep apnea    s/p surgery- last sleep study 2011- doesnt use oxygen or machine at night as instructed,.   12/2014- Dr Halford Chessman  reports it is negative.  . Thyroid disease     Family History  Problem Relation Age of Onset  . Kidney disease Mother   . Heart disease Father   . Anuerysm Brother 29       brain  . Heart disease Brother   . Heart disease Sister 1       s/p CABG  . Hypertension Sister   . Colon cancer Neg Hx     Past Surgical History:  Procedure Laterality Date  . APPENDECTOMY    . BACK SURGERY     18 back surgeries (2 thoracic & 16 lumbar)  (01/31/2016)  . DILATION AND CURETTAGE OF UTERUS    . HAMMER TOE SURGERY    . IR RADIOLOGIST EVAL & MGMT  07/21/2017  . JOINT REPLACEMENT    . KNEE ARTHROSCOPY Right   . LAPAROSCOPIC CHOLECYSTECTOMY    . LUMBAR FUSION N/A 08/01/2015   Procedure: Right sided L1-2 and L2-3 transforaminal lumbar interbody fusion with cages, Extension  of posterior fusion T12 to L3, Replaced pedicle screws bilaterally L1-L2 , Replaced left sided pedicle screws L-3. Instrumentation T12 to L3 using local bone graft, Vivigen allograft and cancellous chips;  Surgeon: [name], MD;  Location: [facility];  Service: Orthopedics;  Laterality: N/A;  . LUMBAR LAMINECTOMY/DECOMPRESSION MICRODISCECTOMY N/A 01/28/2014   Procedure: Minimally Invasive Right  L1-2 Microdiscectomy;  Surgeon: [name], MD;  Location: [facility];  Service: Orthopedics;  Laterality: N/A;  . TOTAL HIP ARTHROPLASTY Right   . TOTAL KNEE ARTHROPLASTY  05/29/2012   Procedure: TOTAL KNEE ARTHROPLASTY;  Surgeon: Mcarthur Rossetti, MD;  Location: [facility];  Service: Orthopedics;  Laterality: Right;  Right Total Knee Arthroplasty  . TUBAL LIGATION    . UVULOPALATOPHARYNGOPLASTY    . VAGINAL HYSTERECTOMY     Social History   Occupational History  . Occupation: disability    Comment: back surgeries  Tobacco Use  . Smoking status: Current Every Day Smoker    Packs/day: 1.25    Years: 45.00    Pack years: 56.25    Types: Cigarettes  . Smokeless tobacco: Never Used  . Tobacco comment: form given 12/25/16  Substance and Sexual Activity  . Alcohol use: No    Alcohol/week: 0.0 standard drinks  . Drug use: No    Types: Marijuana    Comment: 01/31/2016 "none since ~ 1980"  . Sexual activity: Never    Partners: Male

## 2019-01-05 ENCOUNTER — Encounter: Payer: Medicare Other | Attending: Physical Medicine & Rehabilitation | Admitting: Registered Nurse

## 2019-01-05 ENCOUNTER — Encounter: Payer: Self-pay | Admitting: Registered Nurse

## 2019-01-05 ENCOUNTER — Ambulatory Visit: Payer: Medicare Other | Admitting: Registered Nurse

## 2019-01-05 VITALS — BP 137/78 | HR 93 | Ht 63.0 in | Wt 145.0 lb

## 2019-01-05 DIAGNOSIS — Z9981 Dependence on supplemental oxygen: Secondary | ICD-10-CM | POA: Insufficient documentation

## 2019-01-05 DIAGNOSIS — G9619 Other disorders of meninges, not elsewhere classified: Secondary | ICD-10-CM | POA: Insufficient documentation

## 2019-01-05 DIAGNOSIS — M170 Bilateral primary osteoarthritis of knee: Secondary | ICD-10-CM | POA: Insufficient documentation

## 2019-01-05 DIAGNOSIS — I10 Essential (primary) hypertension: Secondary | ICD-10-CM | POA: Insufficient documentation

## 2019-01-05 DIAGNOSIS — M51369 Other intervertebral disc degeneration, lumbar region without mention of lumbar back pain or lower extremity pain: Secondary | ICD-10-CM

## 2019-01-05 DIAGNOSIS — M961 Postlaminectomy syndrome, not elsewhere classified: Secondary | ICD-10-CM | POA: Diagnosis not present

## 2019-01-05 DIAGNOSIS — G4709 Other insomnia: Secondary | ICD-10-CM

## 2019-01-05 DIAGNOSIS — M546 Pain in thoracic spine: Secondary | ICD-10-CM | POA: Diagnosis not present

## 2019-01-05 DIAGNOSIS — E079 Disorder of thyroid, unspecified: Secondary | ICD-10-CM | POA: Insufficient documentation

## 2019-01-05 DIAGNOSIS — M5126 Other intervertebral disc displacement, lumbar region: Secondary | ICD-10-CM | POA: Insufficient documentation

## 2019-01-05 DIAGNOSIS — F411 Generalized anxiety disorder: Secondary | ICD-10-CM

## 2019-01-05 DIAGNOSIS — IMO0002 Reserved for concepts with insufficient information to code with codable children: Secondary | ICD-10-CM

## 2019-01-05 DIAGNOSIS — G894 Chronic pain syndrome: Secondary | ICD-10-CM

## 2019-01-05 DIAGNOSIS — E785 Hyperlipidemia, unspecified: Secondary | ICD-10-CM | POA: Insufficient documentation

## 2019-01-05 DIAGNOSIS — G8929 Other chronic pain: Secondary | ICD-10-CM

## 2019-01-05 DIAGNOSIS — M4316 Spondylolisthesis, lumbar region: Secondary | ICD-10-CM

## 2019-01-05 DIAGNOSIS — G2581 Restless legs syndrome: Secondary | ICD-10-CM

## 2019-01-05 DIAGNOSIS — E039 Hypothyroidism, unspecified: Secondary | ICD-10-CM | POA: Insufficient documentation

## 2019-01-05 DIAGNOSIS — Z5181 Encounter for therapeutic drug level monitoring: Secondary | ICD-10-CM

## 2019-01-05 DIAGNOSIS — Z79891 Long term (current) use of opiate analgesic: Secondary | ICD-10-CM

## 2019-01-05 DIAGNOSIS — M5136 Other intervertebral disc degeneration, lumbar region: Secondary | ICD-10-CM | POA: Diagnosis not present

## 2019-01-05 DIAGNOSIS — J449 Chronic obstructive pulmonary disease, unspecified: Secondary | ICD-10-CM | POA: Insufficient documentation

## 2019-01-05 DIAGNOSIS — M7918 Myalgia, other site: Secondary | ICD-10-CM

## 2019-01-05 DIAGNOSIS — Z9889 Other specified postprocedural states: Secondary | ICD-10-CM | POA: Insufficient documentation

## 2019-01-05 DIAGNOSIS — Z720 Tobacco use: Secondary | ICD-10-CM

## 2019-01-05 DIAGNOSIS — Z79899 Other long term (current) drug therapy: Secondary | ICD-10-CM | POA: Insufficient documentation

## 2019-01-05 DIAGNOSIS — K219 Gastro-esophageal reflux disease without esophagitis: Secondary | ICD-10-CM | POA: Insufficient documentation

## 2019-01-05 DIAGNOSIS — M75102 Unspecified rotator cuff tear or rupture of left shoulder, not specified as traumatic: Secondary | ICD-10-CM | POA: Diagnosis not present

## 2019-01-05 DIAGNOSIS — M2170 Unequal limb length (acquired), unspecified site: Secondary | ICD-10-CM | POA: Insufficient documentation

## 2019-01-05 MED ORDER — FENTANYL 50 MCG/HR TD PT72: 1.0000 | MEDICATED_PATCH | TRANSDERMAL | 0 refills | Status: DC

## 2019-01-05 MED ORDER — HYDROCODONE-ACETAMINOPHEN 10-325 MG PO TABS: 1.0000 | ORAL_TABLET | Freq: Four times a day (QID) | ORAL | 0 refills | Status: DC | PRN

## 2019-01-05 NOTE — Progress Notes (Signed)
Subjective:    Patient ID: Jill Phillips, female    DOB: Aug 25, 1956, 63 y.o.   MRN: [id number]  HPI: Jill Phillips is a 63 y.o. female her apppointment was changed, due to national recommendations of social distancing due to [place] 19, an audio/video telehealth visit is felt to be most appropriate for this patient at this time.  See Chart message from today for the patient's consent to telehealth from [facility].     She states her pain is located in her left shoulder and upper- lower back. Also reports she's experiencing increase left shoulder and mid back pain, she was seen by Dr. [name] on 01/04/2019. Dr. [name] ordered thoracic X-rays and MRI  Of the thoracic spine was ordered. Results are pending at this time. She rates her pain 5.  We will await X-ray and MRI results, if no changes are noted we will continue with the weaning of her analgesics, she verbalizes understanding.  .  Her  current exercise regime is walking.   Ms. Glynn Morphine equivalent is 160.00 MME.She is also prescribed Clonazepam. .We have discussed the black box warning of using opioids and benzodiazepines. I highlighted the dangers of using these drugs together and discussed the adverse events including respiratory suppression, overdose, cognitive impairment and importance of compliance with current regimen. We will continue to monitor and adjust as indicated.   Geryl Rankins CMA asked the health and History Questions. This provider and Mancel Parsons verified we were speaking with the correct person using two identifiers.   Pain Inventory Average Pain 6 Pain Right Now 5 My pain is constant and aching  In the last 24 hours, has pain interfered with the following? General activity 9 Relation with others 3 Enjoyment of life 3 What TIME of day is your pain at its worst? varies Sleep (in general) Good  Pain is worse with: some activites Pain improves with: rest and medication  Relief from Meds: 7  Mobility walk without assistance ability to climb steps?  yes do you drive?  no  Function disabled: date disabled .  Neuro/Psych weakness numbness spasms  Prior Studies Any changes since last visit?  no  Physicians involved in your care Any changes since last visit?  no   Family History  Problem Relation Age of Onset  . Kidney disease Mother   . Heart disease Father   . Anuerysm Brother 29       brain  . Heart disease Brother   . Heart disease Sister 71       s/p CABG  . Hypertension Sister   . Colon cancer Neg Hx    Social History   Socioeconomic History  . Marital status: Divorced    Spouse name: n/a  . Number of children: 2  . Years of education: 12+  . Highest education level: Not on file  Occupational History  . Occupation: disability    Comment: back surgeries  Social Needs  . Financial resource strain: Not on file  . Food insecurity:    Worry: Not on file    Inability: Not on file  . Transportation needs:    Medical: Not on file    Non-medical: Not on file  Tobacco Use  . Smoking status: Current Every Day Smoker    Packs/day: 1.25    Years: 45.00    Pack years: 56.25    Types: Cigarettes  . Smokeless tobacco: Never Used  . Tobacco comment: form given 12/25/16  Substance and Sexual Activity  . Alcohol use: No    Alcohol/week: 0.0 standard drinks  . Drug use: No    Types: Marijuana    Comment: 01/31/2016 "none since ~ 1980"  . Sexual activity: Never    Partners: Male  Lifestyle  . Physical activity:    Days per week: Not on file    Minutes per session: Not on file  . Stress: Not on file  Relationships  . Social connections:    Talks on phone: Not on file    Gets together: Not on file    Attends religious service: Not on file    Active member of club or organization: Not on file    Attends meetings of clubs or organizations: Not on file    Relationship status: Not on file  Other Topics Concern  . Not on file   Social History Narrative   Lives alone.  One daughter is local, but is getting ready to move to [place], where her children live with their father.  The other daughter lives near [place], [place].   Past Surgical History:  Procedure Laterality Date  . APPENDECTOMY    . BACK SURGERY     18 back surgeries (2 thoracic & 16 lumbar) (01/31/2016)  . DILATION AND CURETTAGE OF UTERUS    . HAMMER TOE SURGERY    . IR RADIOLOGIST EVAL & MGMT  07/21/2017  . JOINT REPLACEMENT    . KNEE ARTHROSCOPY Right   . LAPAROSCOPIC CHOLECYSTECTOMY    . LUMBAR FUSION N/A 08/01/2015   Procedure: Right sided L1-2 and L2-3 transforaminal lumbar interbody fusion with cages, Extension of posterior fusion T12 to L3, Replaced pedicle screws bilaterally L1-L2 , Replaced left sided pedicle screws L-3. Instrumentation T12 to L3 using local bone graft, Vivigen allograft and cancellous chips;  Surgeon: [name], MD;  Location: [place] [facility];  Service: Orthopedics;  Laterality: N/A;  . LUMBAR LAMINECTOMY/DECOMPRESSION MICRODISCECTOMY N/A 01/28/2014   Procedure: Minimally Invasive Right  L1-2 Microdiscectomy;  Surgeon: [name], MD;  Location: [place] [facility] [place];  Service: Orthopedics;  Laterality: N/A;  . TOTAL HIP ARTHROPLASTY Right   . TOTAL KNEE ARTHROPLASTY  05/29/2012   Procedure: TOTAL KNEE ARTHROPLASTY;  Surgeon: Mcarthur Rossetti, MD;  Location: [facility];  Service: Orthopedics;  Laterality: Right;  Right Total Knee Arthroplasty  . TUBAL LIGATION    . UVULOPALATOPHARYNGOPLASTY    . VAGINAL HYSTERECTOMY     Past Medical History:  Diagnosis Date  . Active smoker   . Anxiety   . Calcifying tendinitis of shoulder   . Chronic back pain   . Chronic pain syndrome   . COPD (chronic obstructive pulmonary disease) ([place])   . Dysthymic disorder   . Emphysema lung ([place])   . GERD (gastroesophageal reflux disease)   . Headache    "weekly maybe" (01/31/2016)  . Heart murmur    for years, nothing to be concerned about  . Herpes genitalia    . History of blood transfusion 1980   related to "back surgery"  . Hyperlipidemia   . Hypertension   . Hypothyroidism   . Lumbago   . Osteoarthrosis, unspecified whether generalized or localized, lower leg   . Pain in joint, upper arm   . Pneumothorax, left 01/31/2016   S/P Left posterior subcostal pain injection on 01/30/2016  . PONV (postoperative nausea and vomiting)    gets nauseous  with longer surgery. Difficuty voiding after surgery  . Postlaminectomy syndrome, thoracic region   .  Primary localized osteoarthrosis, lower leg   . Restless leg syndrome   . Sleep apnea    s/p surgery- last sleep study 2011- doesnt use oxygen or machine at night as instructed,.   12/2014- Dr Halford Chessman  reports it is negative.  . Thyroid disease    BP 137/78 Comment: taken at home 01/05/2019  Pulse 93 Comment: taken at home 01/05/2019  Ht 5\' 3"  (1.6 m)   Wt 145 lb (65.8 kg)   BMI 25.69 kg/m   Opioid Risk Score:   Fall Risk Score:  `1  Depression screen PHQ 2/9  Depression screen [facility] 2/9 12/09/2018 10/16/2018 09/16/2018 06/22/2018 01/01/2018 12/01/2017 10/10/2017  Decreased Interest 0 0 0 0 0 0 1  Down, Depressed, Hopeless - 0 0 0 0 0 1  PHQ - 2 Score 0 0 0 0 0 0 2  Some recent data might be hidden    Review of Systems  Constitutional: Negative.   HENT: Negative.   Eyes: Negative.   Respiratory: Negative.   Gastrointestinal: Positive for abdominal distention and abdominal pain.  Endocrine: Negative.   Genitourinary: Negative.   Musculoskeletal: Positive for arthralgias and back pain.       Spasms   Skin: Negative.   Allergic/Immunologic: Negative.   Neurological: Positive for weakness and numbness.  All other systems reviewed and are negative.      Objective:   Physical Exam Vitals signs and nursing note reviewed.  Musculoskeletal:     Comments: No Physical Exam Perform: Virtual Visit  Neurological:     Mental Status: She is oriented to person, place, and time.            Assessment & Plan:  1. Lumbar Degenerative Disc/Chronic lumbar spine pain/post-lami syndrome:01/05/2019 ContinueFentanyl 50 MCG one patch every three days #10 andHydrocodone10/325 mg one tablet every 6 hours as needed for pain #120.We will continue the opioid monitoring program, this consists of regular clinic visits, examinations, urine drug screen, pill counts as well as use of [place] Controlled Substance Reporting System. 2. Chronic Thoracic Pain:Continue current medication regime and Continue to Monitor.01/05/2019 3. Osteoarthritis of left knee:Continue with Heat, exercise and voltaren.01/05/2019 4. Rotator cuff syndrome/Leftsubacromial bursitis:Dr. NiktaFollowing.01/05/2019 5. Restless legs syndrome:Continue with current treatment withRequip.Continue to monitor.01/05/2019 6. Tobacco Abuse:Encourage and Educated onSmoking Cessation: PCP prescribed Nicotine Patches : 01/05/2019 7. Muscle Spasm:Continue current treatment Regimen withTizanidine.01/05/2019 8. Insomnia:Continue current treatment regimen withSeroquel. 01/05/2019. 9. Neuropathic Pain: Continue: Gabapentin to 3 times a day. [name]05/01/2019 10.Anxiety: Continue Klonopin to 1.0 mg BID. Continue to monitor. [name] 01/05/2019 11 Chronic Rectal Pain: No complaints today.Continue Pelvic Floor Therapy.Dr. [name] Following.01/05/2019.  F/u in 1 month  Telephone Call  Location of patient: In Her Home Location of provider: Office Established patient Time spent on call: 10 Minutes

## 2019-01-06 ENCOUNTER — Other Ambulatory Visit: Payer: Self-pay | Admitting: Internal Medicine

## 2019-01-06 DIAGNOSIS — I10 Essential (primary) hypertension: Secondary | ICD-10-CM

## 2019-01-07 ENCOUNTER — Telehealth: Payer: Self-pay | Admitting: *Deleted

## 2019-01-07 DIAGNOSIS — M4316 Spondylolisthesis, lumbar region: Secondary | ICD-10-CM

## 2019-01-07 NOTE — Telephone Encounter (Signed)
Copied from [place] [phone]. Topic: Referral - Request for Referral >> Jan 07, 2019  8:03 AM Richardo Priest, NT wrote: Has patient seen PCP for this complaint? Yes *If NO, is insurance requiring patient see PCP for this issue before PCP can refer them? Referral for which specialty: Pain clinic Preferred provider/office: any Reason for referral: Patient states she wants a different pain clinic due to medication issues.

## 2019-01-07 NOTE — Telephone Encounter (Signed)
Let her know I did order the referral.  Let her know this will take a while.  They will refer you review her chart, but may decide not to accept her.  She should continue to follow with her current pain management.

## 2019-01-07 NOTE — Telephone Encounter (Signed)
I called pt at home #. I spoke to a lady who told me to call patient at [phone]. I called that number. No answer/Left mess for patient to call back to advise of below.

## 2019-01-08 ENCOUNTER — Telehealth: Payer: Self-pay | Admitting: Orthopedic Surgery

## 2019-01-08 NOTE — Telephone Encounter (Signed)
Patient last seen 10-28-18 for Left Shoulder.  She states a shoulder replacement  was discussed as an option and she would call when ready. Patient is is also under the care of Dr [name] for back and L knee and awaiting an MRI for the back.  She is currently under the care of pain management, but has been tapering down and will be looking for another doctor. Patient says arm pain is becoming unbearable and she would like to proceed with surgery ASAP.  [phone]

## 2019-01-08 NOTE — Telephone Encounter (Signed)
pls advise. Thanks.  

## 2019-01-08 NOTE — Telephone Encounter (Signed)
Spoke with pt to inform. Advised pt to stay with current Pain Management until she is switched over.

## 2019-01-11 ENCOUNTER — Other Ambulatory Visit: Payer: Self-pay | Admitting: Physical Medicine & Rehabilitation

## 2019-01-11 NOTE — Telephone Encounter (Signed)
Need rov first

## 2019-01-11 NOTE — Telephone Encounter (Signed)
Per Dr Marlou Sa needs ROV with him first.

## 2019-01-12 ENCOUNTER — Ambulatory Visit: Payer: Medicare Other | Admitting: Physical Medicine & Rehabilitation

## 2019-01-13 ENCOUNTER — Other Ambulatory Visit: Payer: Medicare Other

## 2019-01-13 ENCOUNTER — Telehealth: Payer: Self-pay

## 2019-01-13 NOTE — Telephone Encounter (Signed)
-----   Message from Octavio Manns sent at 01/13/2019  1:33 PM EDT ----- Pt needs to see Dr. Quay Burow in order for her to get a pain mgmt referral. She is aware. Dr. Quay Burow ok'd a virtual visit for this.  Can you please call her to get her scheduled.  Thanks  Cecille Rubin :)

## 2019-01-13 NOTE — Progress Notes (Signed)
Virtual Visit via Video Note  I connected with Jill Phillips on 01/14/19 at 10:00 AM EDT by a video enabled telemedicine application and verified that I am speaking with the correct person using two identifiers.   I discussed the limitations of evaluation and management by telemedicine and the availability of in person appointments. The patient expressed understanding and agreed to proceed.  The patient is currently at home and I am in the office.    No referring provider.    History of Present Illness: This visit is an acute visit to discuss her chronic pain and pain management.  She is currently following with pain management, but would like to change to a different pain management practice.  Below are her current pain issues and other complaints that her current pain management practice is treating.  They are planning on decreasing her pain medication in June.   She is in a lot of pain and can not do anything.  Her pain is at least an 8.  She may get 2 hours of pain relief with the Vicodin.  It takes 45 min to take effect.  The tizanidine helps her get some rest, but it does not last long.    Chronic thoracic and lower back pain-degenerative disc disease: She has chronic back pain.  She has had 18 back surgeries :  2 in thoracic area and 16 in the lumbar region.  She is currently taking fentanyl 50 mcg every 3 days, vicodin 10-325mg   1 tablet every 6 hours.    Restless leg syndrome: Taking Requip and clonazepam.  Her RLS is not controlled.  She was on clonazepam 1 mg four times a day and that was the perfect dose for her - the pain clinic recently decreased the dose to twice a day.  She has been crying since the dose was decreased and her RLS has not been controlled.    Muscle spasms: Taking tizanidine.  She takes 1/2 during the day and 2 at night to help her sleep.  The seroquel does not help her sleep enough.  Insomnia: Taking Seroquel at night. She also takes the tizanidine.     Neuropathic pain: Taking gabapentin 2 at bedtime and sometimes one in the morning.    Anxiety: Taking clonazepam 1 mg twice daily.  I prescribe her effexor.      Other pain issues:  Chronic rectal pain: Doing pelvic floor physical therapy.  Rotator cuff syndrome, left sub-acromial bursitis, torn biceps: Following with orthopedics.  Osteoarthritis of left knee: Following with orthopedics.  She applies heat and Voltaren gel. She needs a TKR.    Patient Active Problem List   Diagnosis Date Noted  . Facial trauma, subsequent encounter 12/08/2018  . Headache 12/08/2018  . Syncope 12/08/2018  . COPD with exacerbation ([place]) 11/26/2018  . Acute bronchitis 11/03/2018  . Disorder of rotator cuff syndrome of left shoulder and allied disorder 06/22/2018  . Leg edema, left 06/04/2018  . Type 2 diabetes mellitus without complication, without long-term current use of insulin ([place]) 12/02/2017  . Sacral pain 09/17/2017  . COPD (chronic obstructive pulmonary disease) ([place]) 04/21/2017  . Lower back pain 03/03/2017  . CKD (chronic kidney disease) stage 3, GFR 30-59 ml/min (HCC) 08/07/2016  . GERD (gastroesophageal reflux disease) 08/07/2016  . Chronic respiratory failure ([place]) 07/29/2016  . Hypersomnia 07/29/2016  . Arthritis of carpometacarpal [facility]) joint of left thumb 07/09/2016  . Pneumothorax on left 01/31/2016  . Chronic, continuous use of opioids 09/06/2015  .  Degenerative disc disease, lumbar 08/01/2015    Class: Chronic  . Spondylolisthesis of lumbar region 08/01/2015    Class: Chronic  . Urinary, incontinence, stress female 05/06/2014  . Constipation 05/05/2014  . 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 HTN (hypertension) 05/19/2013  . HLD (hyperlipidemia) 05/19/2013  . Depression 05/19/2013  . Hypothyroidism 05/19/2013  . Tobacco abuse   . Osteoarthritis of both knees 11/18/2011  . RLS (restless legs syndrome) 11/18/2011   Past Medical History:  Diagnosis Date  . Active smoker   .  Anxiety   . Calcifying tendinitis of shoulder   . Chronic back pain   . Chronic pain syndrome   . COPD (chronic obstructive pulmonary disease) ([place])   . Dysthymic disorder   . Emphysema lung ([place])   . GERD (gastroesophageal reflux disease)   . Headache    "weekly maybe" (01/31/2016)  . Heart murmur    for years, nothing to be concerned about  . Herpes genitalia   . History of blood transfusion 1980   related to "back surgery"  . Hyperlipidemia   . Hypertension   . Hypothyroidism   . Lumbago   . Osteoarthrosis, unspecified whether generalized or localized, lower leg   . Pain in joint, upper arm   . Pneumothorax, left 01/31/2016   S/P Left posterior subcostal pain injection on 01/30/2016  . PONV (postoperative nausea and vomiting)    gets nauseous  with longer surgery. Difficuty voiding after surgery  . Postlaminectomy syndrome, thoracic region   . Primary localized osteoarthrosis, lower leg   . Restless leg syndrome   . Sleep apnea    s/p surgery- last sleep study 2011- doesnt use oxygen or machine at night as instructed,.   12/2014- Dr Halford Chessman  reports it is negative.  . Thyroid disease    Past Surgical History:  Procedure Laterality Date  . APPENDECTOMY    . BACK SURGERY     18 back surgeries (2 thoracic & 16 lumbar) (01/31/2016)  . DILATION AND CURETTAGE OF UTERUS    . HAMMER TOE SURGERY    . IR RADIOLOGIST EVAL & MGMT  07/21/2017  . JOINT REPLACEMENT    . KNEE ARTHROSCOPY Right   . LAPAROSCOPIC CHOLECYSTECTOMY    . LUMBAR FUSION N/A 08/01/2015   Procedure: Right sided L1-2 and L2-3 transforaminal lumbar interbody fusion with cages, Extension of posterior fusion T12 to L3, Replaced pedicle screws bilaterally L1-L2 , Replaced left sided pedicle screws L-3. Instrumentation T12 to L3 using local bone graft, Vivigen allograft and cancellous chips;  Surgeon: [name], MD;  Location: [place];  Service: Orthopedics;  Laterality: N/A;  . LUMBAR LAMINECTOMY/DECOMPRESSION  MICRODISCECTOMY N/A 01/28/2014   Procedure: Minimally Invasive Right  L1-2 Microdiscectomy;  Surgeon: [name], MD;  Location: [place];  Service: Orthopedics;  Laterality: N/A;  . TOTAL HIP ARTHROPLASTY Right   . TOTAL KNEE ARTHROPLASTY  05/29/2012   Procedure: TOTAL KNEE ARTHROPLASTY;  Surgeon: Mcarthur Rossetti, MD;  Location: [facility];  Service: Orthopedics;  Laterality: Right;  Right Total Knee Arthroplasty  . TUBAL LIGATION    . UVULOPALATOPHARYNGOPLASTY    . VAGINAL HYSTERECTOMY       Social History   Socioeconomic History  . Marital status: Divorced    Spouse name: n/a  . Number of children: 2  . Years of education: 12+  . Highest education level: Not on file  Occupational History  . Occupation: disability    Comment: back surgeries  Social Needs  .  Financial resource strain: Not on file  . Food insecurity:    Worry: Not on file    Inability: Not on file  . Transportation needs:    Medical: Not on file    Non-medical: Not on file  Tobacco Use  . Smoking status: Current Every Day Smoker    Packs/day: 1.25    Years: 45.00    Pack years: 56.25    Types: Cigarettes  . Smokeless tobacco: Never Used  . Tobacco comment: form given 12/25/16  Substance and Sexual Activity  . Alcohol use: No    Alcohol/week: 0.0 standard drinks  . Drug use: No    Types: Marijuana    Comment: 01/31/2016 "none since ~ 1980"  . Sexual activity: Never    Partners: Male  Lifestyle  . Physical activity:    Days per week: Not on file    Minutes per session: Not on file  . Stress: Not on file  Relationships  . Social connections:    Talks on phone: Not on file    Gets together: Not on file    Attends religious service: Not on file    Active member of club or organization: Not on file    Attends meetings of clubs or organizations: Not on file    Relationship status: Not on file  Other Topics Concern  . Not on file  Social History Narrative   Lives alone.  One daughter is local, but  is getting ready to move to [place], where her children live with their father.  The other daughter lives near [place], [place].     Observations/Objective: Appears well in NAD   Assessment and Plan:  See Problem List for Assessment and Plan of chronic medical problems.   Follow Up Instructions:    I discussed the assessment and treatment plan with the patient. The patient was provided an opportunity to ask questions and all were answered. The patient agreed with the plan and demonstrated an understanding of the instructions.   The patient was advised to call back or seek an in-person evaluation if the symptoms worsen or if the condition fails to improve as anticipated.    [name], MD

## 2019-01-13 NOTE — Telephone Encounter (Signed)
Appointment made

## 2019-01-14 ENCOUNTER — Other Ambulatory Visit: Payer: Self-pay | Admitting: Internal Medicine

## 2019-01-14 ENCOUNTER — Encounter: Payer: Self-pay | Admitting: Internal Medicine

## 2019-01-14 ENCOUNTER — Telehealth: Payer: Self-pay | Admitting: Physical Medicine & Rehabilitation

## 2019-01-14 ENCOUNTER — Ambulatory Visit (INDEPENDENT_AMBULATORY_CARE_PROVIDER_SITE_OTHER): Payer: Medicare Other | Admitting: Internal Medicine

## 2019-01-14 DIAGNOSIS — M5136 Other intervertebral disc degeneration, lumbar region: Secondary | ICD-10-CM

## 2019-01-14 DIAGNOSIS — G2581 Restless legs syndrome: Secondary | ICD-10-CM

## 2019-01-14 DIAGNOSIS — M51369 Other intervertebral disc degeneration, lumbar region without mention of lumbar back pain or lower extremity pain: Secondary | ICD-10-CM

## 2019-01-14 NOTE — Telephone Encounter (Signed)
Ptn called and left voicemail that she wants to know what her "three strikes" are and wants to continue to come here - but that she cannot talk to [facility].  TRLVM ON MACHINE FOR E[place]

## 2019-01-20 ENCOUNTER — Encounter: Payer: Self-pay | Admitting: Orthopedic Surgery

## 2019-01-20 ENCOUNTER — Ambulatory Visit: Payer: Self-pay

## 2019-01-20 ENCOUNTER — Other Ambulatory Visit: Payer: Self-pay

## 2019-01-20 ENCOUNTER — Ambulatory Visit (INDEPENDENT_AMBULATORY_CARE_PROVIDER_SITE_OTHER): Payer: Medicare Other | Admitting: Orthopedic Surgery

## 2019-01-20 DIAGNOSIS — M79603 Pain in arm, unspecified: Secondary | ICD-10-CM

## 2019-01-20 DIAGNOSIS — M542 Cervicalgia: Secondary | ICD-10-CM

## 2019-01-20 NOTE — Progress Notes (Signed)
Office Visit Note   Patient: Jill Phillips           Date of Birth: [birth year]           MRN: [id number] Visit Date: 01/20/2019 Requested by: [name], MD [place], [place] [address] PCP: [name], MD  Subjective: Chief Complaint  Patient presents with  . Left Shoulder - Pain    HPI: Jill Phillips is a patient with left shoulder pain.  She has known left rotator cuff arthropathy.  Her function is surprisingly well-maintained but she reports pain.  Takes Norco every 6 hours and also is on a fentanyl patch.  Going to see new pain management doctor this month.  MRI scan November shows rotator cuff tearing which is not repairable.  Also has had biceps tendon subluxation and rupture.  She is a smoker.              ROS: All systems reviewed are negative as they relate to the chief complaint within the history of present illness.  Patient denies  fevers or chills.   Assessment & Plan: Visit Diagnoses:  1. Pain of upper extremity, unspecified laterality     Plan: Impression is left shoulder rotator cuff arthropathy with surprisingly good function.  She has a lot of pain.  I do not think she is a great candidate for reverse shoulder replacement due to her age smoking massive amounts of pain medicine usage as well as well-preserved function.  In my experience doing this surgery for pain relief and the patient who is on this much pain medicine typically is not successful in terms of alleviating pain.  I think it would however be advisable to get plain x-rays of her cervical spine and MRI of her cervical spine due to the possibility that this could be radiating pain from the neck.  She does have some hand symptoms with pain radiating into the thumb.  This sounds radicular in nature.  Plain x-rays pending MRI scan pending.  I will call her with those results and we can proceed from there with possible injections into the neck.  Follow-Up Instructions: No follow-ups on file.   Orders:   No orders of the defined types were placed in this encounter.  No orders of the defined types were placed in this encounter.     Procedures: No procedures performed   Clinical Data: No additional findings.  Objective: Vital Signs: There were no vitals taken for this visit.  Physical Exam:   Constitutional: Patient appears well-developed HEENT:  Head: Normocephalic Eyes:EOM are normal Neck: Normal range of motion Cardiovascular: Normal rate Pulmonary/chest: Effort normal Neurologic: Patient is alert Skin: Skin is warm Psychiatric: Patient has normal mood and affect    Ortho Exam: Ortho exam demonstrates continued flexion and abduction both above 90 degrees on the left.  No definite paresthesias C5-T1.  Biceps deformity not present in the left arm.  Neck range of motion full.  She does have some coarseness and grinding with passive range of motion.  Specialty Comments:  No specialty comments available.  Imaging: No results found.   PMFS History: Patient Active Problem List   Diagnosis Date Noted  . Facial trauma, subsequent encounter 12/08/2018  . Headache 12/08/2018  . Syncope 12/08/2018  . COPD with exacerbation ([place]) 11/26/2018  . Acute bronchitis 11/03/2018  . Disorder of rotator cuff syndrome of left shoulder and allied disorder 06/22/2018  . Leg edema, left 06/04/2018  . Type 2  diabetes mellitus without complication, without long-term current use of insulin ([place]) 12/02/2017  . Sacral pain 09/17/2017  . COPD (chronic obstructive pulmonary disease) ([place]) 04/21/2017  . Lower back pain 03/03/2017  . CKD (chronic kidney disease) stage 3, GFR 30-59 ml/min (HCC) 08/07/2016  . GERD (gastroesophageal reflux disease) 08/07/2016  . Chronic respiratory failure ([place]) 07/29/2016  . Hypersomnia 07/29/2016  . Arthritis of carpometacarpal [facility]) joint of left thumb 07/09/2016  . Pneumothorax on left 01/31/2016  . Chronic, continuous use of opioids 09/06/2015  .  Degenerative disc disease, lumbar 08/01/2015    Class: Chronic  . Spondylolisthesis of lumbar region 08/01/2015    Class: Chronic  . Urinary, incontinence, stress female 05/06/2014  . Constipation 05/05/2014  . 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 HTN (hypertension) 05/19/2013  . HLD (hyperlipidemia) 05/19/2013  . Depression 05/19/2013  . Hypothyroidism 05/19/2013  . Tobacco abuse   . Osteoarthritis of both knees 11/18/2011  . RLS (restless legs syndrome) 11/18/2011   Past Medical History:  Diagnosis Date  . Active smoker   . Anxiety   . Calcifying tendinitis of shoulder   . Chronic back pain   . Chronic pain syndrome   . COPD (chronic obstructive pulmonary disease) ([place])   . Dysthymic disorder   . Emphysema lung ([place])   . GERD (gastroesophageal reflux disease)   . Headache    "weekly maybe" (01/31/2016)  . Heart murmur    for years, nothing to be concerned about  . Herpes genitalia   . History of blood transfusion 1980   related to "back surgery"  . Hyperlipidemia   . Hypertension   . Hypothyroidism   . Lumbago   . Osteoarthrosis, unspecified whether generalized or localized, lower leg   . Pain in joint, upper arm   . Pneumothorax, left 01/31/2016   S/P Left posterior subcostal pain injection on 01/30/2016  . PONV (postoperative nausea and vomiting)    gets nauseous  with longer surgery. Difficuty voiding after surgery  . Postlaminectomy syndrome, thoracic region   . Primary localized osteoarthrosis, lower leg   . Restless leg syndrome   . Sleep apnea    s/p surgery- last sleep study 2011- doesnt use oxygen or machine at night as instructed,.   12/2014- Dr Halford Chessman  reports it is negative.  . Thyroid disease     Family History  Problem Relation Age of Onset  . Kidney disease Mother   . Heart disease Father   . Anuerysm Brother 29       brain  . Heart disease Brother   . Heart disease Sister 44       s/p CABG  . Hypertension Sister   . Colon cancer Neg Hx     Past  Surgical History:  Procedure Laterality Date  . APPENDECTOMY    . BACK SURGERY     18 back surgeries (2 thoracic & 16 lumbar) (01/31/2016)  . DILATION AND CURETTAGE OF UTERUS    . HAMMER TOE SURGERY    . IR RADIOLOGIST EVAL & MGMT  07/21/2017  . JOINT REPLACEMENT    . KNEE ARTHROSCOPY Right   . LAPAROSCOPIC CHOLECYSTECTOMY    . LUMBAR FUSION N/A 08/01/2015   Procedure: Right sided L1-2 and L2-3 transforaminal lumbar interbody fusion with cages, Extension of posterior fusion T12 to L3, Replaced pedicle screws bilaterally L1-L2 , Replaced left sided pedicle screws L-3. Instrumentation T12 to L3 using local bone graft, Vivigen allograft and cancellous chips;  Surgeon: [name], MD;  Location: [place];  Service: Orthopedics;  Laterality: N/A;  . LUMBAR LAMINECTOMY/DECOMPRESSION MICRODISCECTOMY N/A 01/28/2014   Procedure: Minimally Invasive Right  L1-2 Microdiscectomy;  Surgeon: [name], MD;  Location: [place];  Service: Orthopedics;  Laterality: N/A;  . TOTAL HIP ARTHROPLASTY Right   . TOTAL KNEE ARTHROPLASTY  05/29/2012   Procedure: TOTAL KNEE ARTHROPLASTY;  Surgeon: Mcarthur Rossetti, MD;  [facility];  Service: Orthopedics;  Laterality: Right;  Right Total Knee Arthroplasty  . TUBAL LIGATION    . UVULOPALATOPHARYNGOPLASTY    . VAGINAL HYSTERECTOMY     Social History   Occupational History  . Occupation: disability    Comment: back surgeries  Tobacco Use  . Smoking status: Current Every Day Smoker    Packs/day: 1.25    Years: 45.00    Pack years: 56.25    Types: Cigarettes  . Smokeless tobacco: Never Used  . Tobacco comment: form given 12/25/16  Substance and Sexual Activity  . Alcohol use: No    Alcohol/week: 0.0 standard drinks  . Drug use: No    Types: Marijuana    Comment: 01/31/2016 "none since ~ 1980"  . Sexual activity: Never    Partners: Male

## 2019-01-20 NOTE — Addendum Note (Signed)
Addended byLaurann Montana on: 01/20/2019 01:31 PM   Modules accepted: Orders

## 2019-01-26 ENCOUNTER — Telehealth: Payer: Self-pay | Admitting: Orthopedic Surgery

## 2019-01-26 NOTE — Telephone Encounter (Signed)
I called patient  No answer. LMVM advising due to COVID 19 MRI scheduling/pre-auth has been delayed and that schedulers are working on it trying to get caught up.

## 2019-01-26 NOTE — Telephone Encounter (Signed)
Pt called in left vm said she came in to see dr.dean for shoulder/neck pain and was told that she would need an mri done but she said she hasn't heard anything about the mri since her appt.

## 2019-01-30 ENCOUNTER — Telehealth: Payer: Self-pay | Admitting: Internal Medicine

## 2019-01-30 NOTE — Telephone Encounter (Signed)
RN spoke with the patient regarding possible exposure to Covid-19 virus at office visit [facility], 01/20/19.  Patient has transportation difficulties and declined the screening clinics.  Would need to call transportation on Monday and then, wait until Thursday for transportation.  RN advised the patient to watch for Covid-19 symptoms and to call her PCP if they develop.  Patient verbalized understanding of these instructions.

## 2019-02-01 ENCOUNTER — Ambulatory Visit: Payer: Medicare Other | Admitting: Specialist

## 2019-02-02 ENCOUNTER — Other Ambulatory Visit: Payer: Self-pay | Admitting: Registered Nurse

## 2019-02-03 ENCOUNTER — Other Ambulatory Visit: Payer: Self-pay | Admitting: Internal Medicine

## 2019-02-03 DIAGNOSIS — G894 Chronic pain syndrome: Secondary | ICD-10-CM | POA: Diagnosis not present

## 2019-02-03 DIAGNOSIS — M419 Scoliosis, unspecified: Secondary | ICD-10-CM | POA: Diagnosis not present

## 2019-02-03 DIAGNOSIS — M542 Cervicalgia: Secondary | ICD-10-CM | POA: Diagnosis not present

## 2019-02-03 DIAGNOSIS — Z79891 Long term (current) use of opiate analgesic: Secondary | ICD-10-CM | POA: Diagnosis not present

## 2019-02-03 DIAGNOSIS — Z79899 Other long term (current) drug therapy: Secondary | ICD-10-CM | POA: Diagnosis not present

## 2019-02-03 DIAGNOSIS — M961 Postlaminectomy syndrome, not elsewhere classified: Secondary | ICD-10-CM | POA: Diagnosis not present

## 2019-02-03 MED ORDER — QUETIAPINE FUMARATE 100 MG PO TABS: 100.0000 mg | ORAL_TABLET | Freq: Every day | ORAL | 2 refills | Status: DC

## 2019-02-03 MED ORDER — CLONAZEPAM 1 MG PO TABS: 1.0000 mg | ORAL_TABLET | Freq: Two times a day (BID) | ORAL | 0 refills | Status: DC | PRN

## 2019-02-03 NOTE — Telephone Encounter (Signed)
Yes I will prescribe them, but  I will not be increasing her dose -- sent to [facility]

## 2019-02-03 NOTE — Telephone Encounter (Signed)
Filled on 01/04/19 for a 30 day supply. You have not prescribed. Please advise.

## 2019-02-03 NOTE — Telephone Encounter (Signed)
Copied from [place] [phone]. Topic: Quick Communication - Rx Refill/Question >> Feb 03, 2019 12:29 PM Scherrie Gerlach wrote: Medication: clonazePAM (KLONOPIN) 1 MG tablet  BID QUEtiapine (SEROQUEL) 100 MG tablet  Pt states the new pain clinic she had appt today will not write these meds.  Pt wants to know if Dr Quay Burow will pick them up for her?  [place], [place] - [place] [phone] (Phone) [phone] (Fax)

## 2019-02-04 NOTE — Telephone Encounter (Signed)
Pt informed of below.  

## 2019-02-05 ENCOUNTER — Ambulatory Visit: Payer: Medicare Other | Admitting: Registered Nurse

## 2019-02-12 ENCOUNTER — Other Ambulatory Visit: Payer: Self-pay | Admitting: Physical Medicine & Rehabilitation

## 2019-02-12 ENCOUNTER — Ambulatory Visit
Admission: RE | Admit: 2019-02-12 | Discharge: 2019-02-12 | Disposition: A | Discharge status: Home / Self Care | Payer: Medicare Other | Source: Ambulatory Visit | Attending: Orthopedic Surgery | Admitting: Orthopedic Surgery

## 2019-02-12 ENCOUNTER — Other Ambulatory Visit: Payer: Self-pay

## 2019-02-12 ENCOUNTER — Ambulatory Visit
Admission: RE | Admit: 2019-02-12 | Discharge: 2019-02-12 | Disposition: A | Discharge status: Home / Self Care | Payer: Medicare Other | Source: Ambulatory Visit | Attending: Specialist | Admitting: Specialist

## 2019-02-12 DIAGNOSIS — M4325 Fusion of spine, thoracolumbar region: Secondary | ICD-10-CM

## 2019-02-12 DIAGNOSIS — M5414 Radiculopathy, thoracic region: Secondary | ICD-10-CM

## 2019-02-12 DIAGNOSIS — M5124 Other intervertebral disc displacement, thoracic region: Secondary | ICD-10-CM | POA: Diagnosis not present

## 2019-02-12 DIAGNOSIS — M542 Cervicalgia: Secondary | ICD-10-CM | POA: Diagnosis not present

## 2019-02-12 DIAGNOSIS — M47814 Spondylosis without myelopathy or radiculopathy, thoracic region: Secondary | ICD-10-CM | POA: Diagnosis not present

## 2019-02-12 DIAGNOSIS — M4804 Spinal stenosis, thoracic region: Secondary | ICD-10-CM | POA: Diagnosis not present

## 2019-02-12 DIAGNOSIS — M79603 Pain in arm, unspecified: Secondary | ICD-10-CM

## 2019-02-12 DIAGNOSIS — R1010 Upper abdominal pain, unspecified: Secondary | ICD-10-CM

## 2019-02-16 DIAGNOSIS — M5412 Radiculopathy, cervical region: Secondary | ICD-10-CM | POA: Diagnosis not present

## 2019-02-16 DIAGNOSIS — M79609 Pain in unspecified limb: Secondary | ICD-10-CM | POA: Diagnosis not present

## 2019-02-22 ENCOUNTER — Other Ambulatory Visit: Payer: Self-pay | Admitting: Internal Medicine

## 2019-02-25 ENCOUNTER — Encounter: Payer: Self-pay | Admitting: Specialist

## 2019-02-25 ENCOUNTER — Ambulatory Visit: Payer: Self-pay

## 2019-02-25 ENCOUNTER — Ambulatory Visit (INDEPENDENT_AMBULATORY_CARE_PROVIDER_SITE_OTHER): Payer: Medicare Other | Admitting: Specialist

## 2019-02-25 ENCOUNTER — Other Ambulatory Visit: Payer: Self-pay

## 2019-02-25 VITALS — BP 160/98 | HR 95 | Ht 63.0 in | Wt 151.0 lb

## 2019-02-25 DIAGNOSIS — M19012 Primary osteoarthritis, left shoulder: Secondary | ICD-10-CM

## 2019-02-25 DIAGNOSIS — M5134 Other intervertebral disc degeneration, thoracic region: Secondary | ICD-10-CM | POA: Diagnosis not present

## 2019-02-25 DIAGNOSIS — M19042 Primary osteoarthritis, left hand: Secondary | ICD-10-CM

## 2019-02-25 DIAGNOSIS — M19041 Primary osteoarthritis, right hand: Secondary | ICD-10-CM

## 2019-02-25 MED ORDER — METHYLPREDNISOLONE ACETATE 40 MG/ML IJ SUSP
40.0000 mg | INTRAMUSCULAR | Status: AC | PRN
  Administered 2019-02-25: 40 mg via INTRA_ARTICULAR

## 2019-02-25 MED ORDER — BUPIVACAINE HCL 0.5 % IJ SOLN
1.0000 mL | INTRAMUSCULAR | Status: AC | PRN
  Administered 2019-02-25: 1 mL

## 2019-02-25 MED ORDER — METHYLPREDNISOLONE ACETATE 40 MG/ML IJ SUSP
20.0000 mg | INTRAMUSCULAR | Status: AC | PRN
  Administered 2019-02-25: 20 mg

## 2019-02-25 MED ORDER — BUPIVACAINE HCL 0.25 % IJ SOLN
4.0000 mL | INTRAMUSCULAR | Status: AC | PRN
  Administered 2019-02-25: 4 mL via INTRA_ARTICULAR

## 2019-02-25 NOTE — Progress Notes (Signed)
Office Visit Note   Patient: Jill Phillips           Date of Birth: 19-Nov-1955           MRN: [id number] Visit Date: 02/25/2019              Requested by: [name], MD [place],  [place] [address] PCP: [name], MD   Assessment & Plan: Visit Diagnoses:  1. Primary osteoarthritis, left hand   2. Primary osteoarthritis, right hand   3. Primary osteoarthritis, left shoulder   4. Degenerative disc disease, thoracic     Plan:Avoid frequent bending and stooping  No lifting greater than 10 lbs. May use ice or moist heat for pain. Weight loss is of benefit. No NSAIDs due to kidney injury with these medications.  Exercise is important to improve your indurance and does allow people to function better inspite of back pain. Hemp CBD Oil capsules 5,000-7,000 mg per bottle 60 capsules per bottle [email or web address]    Follow-Up Instructions: No follow-ups on file.   Orders:  Orders Placed This Encounter  Procedures   Hand/UE Inj: L thumb CMC   Large Joint Inj: L glenohumeral   XR Hand Complete Left   No orders of the defined types were placed in this encounter.     Procedures: Hand/UE Inj: L thumb CMC for osteoarthritis on 02/25/2019 5:03 PM Indications: joint swelling and pain Details: 25 G needle, dorsal approach Medications: 1 mL bupivacaine 0.5 %; 20 mg methylPREDNISolone acetate 40 MG/ML  bandaid applied  Large Joint Inj: L glenohumeral on 02/25/2019 5:04 PM Indications: pain Details: 25 G 1.5 in needle, anterolateral approach  Arthrogram: No  Medications: 40 mg methylPREDNISolone acetate 40 MG/ML; 4 mL bupivacaine 0.25 % Outcome: tolerated well, no immediate complications Procedure, treatment alternatives, risks and benefits explained, specific risks discussed. Consent was given by the patient. Immediately prior to procedure a time out was called to verify the correct patient, procedure, equipment, support staff and site/side marked as required.  Patient was prepped and draped in the usual sterile fashion.       Clinical Data: No additional findings.   Subjective: Chief Complaint  Patient presents with   Middle Back - Follow-up    HPI  Review of Systems   Objective: Vital Signs: BP (!) 160/98 (BP Location: Left Arm, Patient Position: Sitting)    Pulse 95    Ht 5\' 3"  (1.6 m)    Wt 151 lb (68.5 kg)    BMI 26.75 kg/m   Physical Exam  Ortho Exam  Specialty Comments:  No specialty comments available.  Imaging: Xr Hand Complete Left  Result Date: 02/25/2019 AP and lateral and oblique radiographs of the left hand shows degenerative changes of the left thumb C-MC joint with narrowing and osteophyte formation mild lateral subluxation of the left thumb MC base on the trapezius.    PMFS History: Patient Active Problem List   Diagnosis Date Noted   Degenerative disc disease, lumbar 08/01/2015    Priority: High    Class: Chronic   Spondylolisthesis of lumbar region 08/01/2015    Priority: High    Class: Chronic   Facial trauma, subsequent encounter 12/08/2018   Headache 12/08/2018   Syncope 12/08/2018   COPD with exacerbation ([place]) 11/26/2018   Acute bronchitis 11/03/2018   Disorder of rotator cuff syndrome of left shoulder and allied disorder 06/22/2018   Leg edema, left 06/04/2018   Type 2 diabetes mellitus without  complication, without long-term current use of insulin ([place]) 12/02/2017   Sacral pain 09/17/2017   COPD (chronic obstructive pulmonary disease) ([place]) 04/21/2017   Lower back pain 03/03/2017   CKD (chronic kidney disease) stage 3, GFR 30-59 ml/min (HCC) 08/07/2016   GERD (gastroesophageal reflux disease) 08/07/2016   Chronic respiratory failure ([place]) 07/29/2016   Hypersomnia 07/29/2016   Arthritis of carpometacarpal ([place]) joint of left thumb 07/09/2016   Pneumothorax on left 01/31/2016   Chronic, continuous use of opioids 09/06/2015   Urinary, incontinence, stress female  05/06/2014   Constipation 05/05/2014   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/14/2013   HTN (hypertension) 05/19/2013   HLD (hyperlipidemia) 05/19/2013   Depression 05/19/2013   Hypothyroidism 05/19/2013   Tobacco abuse    Osteoarthritis of both knees 11/18/2011   RLS (restless legs syndrome) 11/18/2011   Past Medical History:  Diagnosis Date   Active smoker    Anxiety    Calcifying tendinitis of shoulder    Chronic back pain    Chronic pain syndrome    COPD (chronic obstructive pulmonary disease) ([place])    Dysthymic disorder    Emphysema lung (HCC)    GERD (gastroesophageal reflux disease)    Headache    "weekly maybe" (01/31/2016)   Heart murmur    for years, nothing to be concerned about   Herpes genitalia    History of blood transfusion 1980   related to "back surgery"   Hyperlipidemia    Hypertension    Hypothyroidism    Lumbago    Osteoarthrosis, unspecified whether generalized or localized, lower leg    Pain in joint, upper arm    Pneumothorax, left 01/31/2016   S/P Left posterior subcostal pain injection on 01/30/2016   PONV (postoperative nausea and vomiting)    gets nauseous  with longer surgery. Difficuty voiding after surgery   Postlaminectomy syndrome, thoracic region    Primary localized osteoarthrosis, lower leg    Restless leg syndrome    Sleep apnea    s/p surgery- last sleep study 2011- doesnt use oxygen or machine at night as instructed,.   12/2014- Dr Halford Chessman  reports it is negative.   Thyroid disease     Family History  Problem Relation Age of Onset   Kidney disease Mother    Heart disease Father    Anuerysm Brother 17       brain   Heart disease Brother    Heart disease Sister 39       s/p CABG   Hypertension Sister    Colon cancer Neg Hx     Past Surgical History:  Procedure Laterality Date   APPENDECTOMY     BACK SURGERY     18 back surgeries (2 thoracic & 16 lumbar) (01/31/2016)   DILATION AND CURETTAGE OF UTERUS       HAMMER TOE SURGERY     IR RADIOLOGIST EVAL & MGMT  07/21/2017   JOINT REPLACEMENT     KNEE ARTHROSCOPY Right    LAPAROSCOPIC CHOLECYSTECTOMY     LUMBAR FUSION N/A 08/01/2015   Procedure: Right sided L1-2 and L2-3 transforaminal lumbar interbody fusion with cages, Extension of posterior fusion T12 to L3, Replaced pedicle screws bilaterally L1-L2 , Replaced left sided pedicle screws L-3. Instrumentation T12 to L3 using local bone graft, Vivigen allograft and cancellous chips;  Surgeon: [name], MD;  Location: [facility];  Service: Orthopedics;  Laterality: N/A;   LUMBAR LAMINECTOMY/DECOMPRESSION MICRODISCECTOMY N/A 01/28/2014   Procedure: Minimally Invasive Right  L1-2 Microdiscectomy;  Surgeon: [name], MD;  Location: [facility];  Service: Orthopedics;  Laterality: N/A;   TOTAL HIP ARTHROPLASTY Right    TOTAL KNEE ARTHROPLASTY  05/29/2012   Procedure: TOTAL KNEE ARTHROPLASTY;  Surgeon: Mcarthur Rossetti, MD;  Location: [facility];  Service: Orthopedics;  Laterality: Right;  Right Total Knee Arthroplasty   TUBAL LIGATION     UVULOPALATOPHARYNGOPLASTY     VAGINAL HYSTERECTOMY     Social History   Occupational History   Occupation: disability    Comment: back surgeries  Tobacco Use   Smoking status: Current Every Day Smoker    Packs/day: 1.25    Years: 45.00    Pack years: 56.25    Types: Cigarettes   Smokeless tobacco: Never Used   Tobacco comment: form given 12/25/16  Substance and Sexual Activity   Alcohol use: No    Alcohol/week: 0.0 standard drinks   Drug use: No    Types: Marijuana    Comment: 01/31/2016 "none since ~ 1980"   Sexual activity: Never    Partners: Male

## 2019-02-25 NOTE — Patient Instructions (Signed)
Plan:Avoid frequent bending and stooping  No lifting greater than 10 lbs. May use ice or moist heat for pain. Weight loss is of benefit. No NSAIDs due to kidney injury with these medications.  Exercise is important to improve your indurance and does allow people to function better inspite of back pain. Hemp CBD Oil capsules 5,000-7,000 mg per bottle 60 capsules per bottle [email or web address]

## 2019-02-26 ENCOUNTER — Other Ambulatory Visit: Payer: Self-pay | Admitting: Internal Medicine

## 2019-02-26 NOTE — Telephone Encounter (Signed)
Check [place] registry last filled 02/04/2019. Pt not due until July 4th.. no early refill.Marland KitchenJohny Phillips

## 2019-02-26 NOTE — Telephone Encounter (Signed)
Check [place] registry last filled 02/04/19. Sent request back stating denied no early refills. Pharmacy sent another request sttling  " CAN A REFILL BE APPROVED FOR FRIDAY JULY 3RD SO THAT WE CAN DELIVER TO PATIENT. WE DO NOT HAVE DELIVERY ON SATURDAYS." MD is out of the office today will hold until she return on Monday for approval../lmb

## 2019-03-03 DIAGNOSIS — M961 Postlaminectomy syndrome, not elsewhere classified: Secondary | ICD-10-CM | POA: Diagnosis not present

## 2019-03-03 DIAGNOSIS — G894 Chronic pain syndrome: Secondary | ICD-10-CM | POA: Diagnosis not present

## 2019-03-03 DIAGNOSIS — M47812 Spondylosis without myelopathy or radiculopathy, cervical region: Secondary | ICD-10-CM | POA: Diagnosis not present

## 2019-03-03 DIAGNOSIS — M25512 Pain in left shoulder: Secondary | ICD-10-CM | POA: Diagnosis not present

## 2019-03-04 ENCOUNTER — Telehealth: Payer: Self-pay

## 2019-03-04 NOTE — Telephone Encounter (Signed)
Patient left voice mail that she wants to proceed with thoracic spine surgery.  [phone]

## 2019-03-05 ENCOUNTER — Other Ambulatory Visit: Payer: Self-pay | Admitting: Internal Medicine

## 2019-03-10 ENCOUNTER — Telehealth: Payer: Self-pay

## 2019-03-10 ENCOUNTER — Other Ambulatory Visit: Payer: Self-pay | Admitting: Internal Medicine

## 2019-03-10 DIAGNOSIS — M549 Dorsalgia, unspecified: Secondary | ICD-10-CM

## 2019-03-10 DIAGNOSIS — G8929 Other chronic pain: Secondary | ICD-10-CM

## 2019-03-10 NOTE — Telephone Encounter (Signed)
Referral ordered

## 2019-03-10 NOTE — Telephone Encounter (Signed)
Copied from [place] [phone]. Topic: General - Inquiry >> Mar 10, 2019  9:35 AM Virl Axe D wrote: Reason for CRM: Pt stated that she is not satisfied with the treatment she has been getting at [facility] with [name] and would like to know if Dr. Quay Burow would refer her to Dr. Nicholaus Bloom with [facility]. Pt stated she has spoken to them and they just need a referral from Dr. Quay Burow. [phone]

## 2019-03-11 NOTE — Telephone Encounter (Signed)
Started blue sheet for Dr. [name]

## 2019-03-15 ENCOUNTER — Telehealth: Payer: Self-pay | Admitting: Specialist

## 2019-03-15 NOTE — Telephone Encounter (Signed)
Pt called in requesting a letter to give to your care giver said she has to have a new one every 6 months. Pt is wondering if you can mail it out to her today she needs it by 03-17-2019.  Renew letter that was made on 09-18-2018  [phone]

## 2019-03-22 ENCOUNTER — Encounter: Payer: Self-pay | Admitting: Radiology

## 2019-03-22 ENCOUNTER — Telehealth: Payer: Self-pay | Admitting: Specialist

## 2019-03-22 NOTE — Telephone Encounter (Signed)
Patient called and stated that she needs the letter updated that you usually do for her. Please call her and let her know when she can pick it up.  [phone]

## 2019-03-22 NOTE — Telephone Encounter (Signed)
Letter has been done and put at the front desk for pick up, I called and lmom for pt to let her know.

## 2019-03-22 NOTE — Telephone Encounter (Signed)
Returned call to patient left message to call back. 

## 2019-03-24 DIAGNOSIS — M1812 Unilateral primary osteoarthritis of first carpometacarpal joint, left hand: Secondary | ICD-10-CM | POA: Diagnosis not present

## 2019-03-24 DIAGNOSIS — M1811 Unilateral primary osteoarthritis of first carpometacarpal joint, right hand: Secondary | ICD-10-CM | POA: Diagnosis not present

## 2019-03-24 NOTE — Telephone Encounter (Signed)
Referral sent to [facility] and pt is aware

## 2019-03-24 NOTE — Telephone Encounter (Signed)
Patient is calling to check the status of her pain management referral.  Patient stated she still does not have an appt. With [name] and when she called their office, they said that there was no referral sent.  Please advise and call patient at [phone]

## 2019-03-31 ENCOUNTER — Other Ambulatory Visit: Payer: Self-pay | Admitting: Internal Medicine

## 2019-03-31 NOTE — Telephone Encounter (Signed)
Last OV was 11/03/18 Next OV 05/05/19 Last RF 03/05/19

## 2019-04-01 ENCOUNTER — Ambulatory Visit: Payer: Self-pay

## 2019-04-01 ENCOUNTER — Other Ambulatory Visit: Payer: Self-pay

## 2019-04-01 ENCOUNTER — Encounter: Payer: Self-pay | Admitting: Specialist

## 2019-04-01 ENCOUNTER — Ambulatory Visit (INDEPENDENT_AMBULATORY_CARE_PROVIDER_SITE_OTHER): Payer: Medicare Other | Admitting: Specialist

## 2019-04-01 VITALS — BP 191/104 | HR 83 | Ht 63.0 in | Wt 151.0 lb

## 2019-04-01 DIAGNOSIS — M960 Pseudarthrosis after fusion or arthrodesis: Secondary | ICD-10-CM | POA: Diagnosis not present

## 2019-04-01 DIAGNOSIS — M4325 Fusion of spine, thoracolumbar region: Secondary | ICD-10-CM

## 2019-04-01 DIAGNOSIS — M5134 Other intervertebral disc degeneration, thoracic region: Secondary | ICD-10-CM | POA: Diagnosis not present

## 2019-04-01 MED ORDER — HYDROCODONE-ACETAMINOPHEN 10-325 MG PO TABS: 2.0000 | ORAL_TABLET | Freq: Four times a day (QID) | ORAL | 0 refills | Status: DC | PRN

## 2019-04-01 MED ORDER — FENTANYL 50 MCG/HR TD PT72: 1.0000 | MEDICATED_PATCH | TRANSDERMAL | 0 refills | Status: DC

## 2019-04-01 NOTE — Patient Instructions (Signed)
  Plan: Avoid frequent bending and stooping  No lifting greater than 10 lbs. May use ice or moist heat for pain. Weight loss is of benefit. Exercise is important to improve your indurance and does allow people to function better inspite of back pain. CT Scan of the lumbar spine to assess for nonunion of T12-L1 fusion seen on last CT scan 2017. Assess hardware pre op  Scoliosis xrays to assess your alignment of your spine.  Avoid bending, stooping and avoid lifting weights greater than 10 lbs. Avoid prolong standing and walking. Order for a new walker with wheels. Surgery scheduling secretary Kandice Hams, will call you in the next week to schedule for surgery.  Surgery recommended is a three level thoracic fusion T7-8, T8-9 and T9-10 and T10-11with decompression and foramenotomies at T8-9 this would be done with rods, screws and cages with local bone graft and allograft (donor bone graft). Take hydrocodone  And fentanyl patch for for pain. Risk of surgery includes risk of infection 1 in 200 patients, bleeding 1/2% chance you would need a transfusion.   Risk to the nerves is one in 10,000. You will need to use a brace for 3 months and wean from the brace on the 4th month. Expect improved walking and standing tolerance. Expect relief of leg pain but numbness may persist depending on the length and degree of pressure that has been present.

## 2019-04-01 NOTE — Progress Notes (Signed)
Office Visit Note   Patient: Jill Phillips           Date of Birth: Jul 19, 1956           MRN: [id number] Visit Date: 04/01/2019              Requested by: Jill Rail, MD [place],  Jill Phillips [id number] PCP: Jill Rail, MD   Assessment & Plan: Visit Diagnoses:  1. Degenerative disc disease, thoracic   2. Fusion of spine of thoracolumbar region   3. Pseudarthrosis after fusion or arthrodesis     Plan: Avoid frequent bending and stooping  No lifting greater than 10 lbs. May use ice or moist heat for pain. Weight loss is of benefit. Exercise is important to improve your indurance and does allow people to function better inspite of back pain. CT Scan of the lumbar spine to assess for nonunion of T12-L1 fusion seen on last CT scan 2017. Assess hardware pre op  Scoliosis xrays to assess your alignment of your spine.  Avoid bending, stooping and avoid lifting weights greater than 10 lbs. Avoid prolong standing and walking. Order for a new walker with wheels. Surgery scheduling secretary Jill Phillips, will call you in the next week to schedule for surgery.  Surgery recommended is a three level thoracic fusion T7-8, T8-9 and T9-10 and T10-11with decompression and foramenotomies at T8-9 this would be done with rods, screws and cages with local bone graft and allograft (donor bone graft). Take hydrocodone  And fentanyl patch for for pain. Risk of surgery includes risk of infection 1 in 200 patients, bleeding 1/2% chance you would need a transfusion.   Risk to the nerves is one in 10,000. You will need to use a brace for 3 months and wean from the brace on the 4th month. Expect improved walking and standing tolerance. Expect relief of leg pain but numbness may persist depending on the length and degree of pressure that has been present.    Follow-Up Instructions: No follow-ups on file.   Orders:  Orders Placed This Encounter  Procedures   XR SCOLIOSIS EVAL COMPLETE  SPINE 2 OR 3 VIEWS   CT LUMBAR SPINE WO CONTRAST   Meds ordered this encounter  Medications   fentaNYL (DURAGESIC) 50 MCG/HR    Sig: Place 1 patch onto the skin every 3 (three) days.    Dispense:  5 patch    Refill:  0   HYDROcodone-acetaminophen (NORCO) 10-325 MG tablet    Sig: Take 2 tablets by mouth every 6 (six) hours as needed.    Dispense:  40 tablet    Refill:  0      Procedures: No procedures performed   Clinical Data: No additional findings.   Subjective: Chief Complaint  Patient presents with   Right Thumb - Pain    63 year old female with history of right hip hemiarthroplasty and right total knee replacement and previous thoracolumbar fusion. She relates that she no longer has a pain management specialist, Dr. Vira Phillips reportedly questioned her and arranged for her to return to the Office for a bilateral 6 level cervical joint injections. She took 2.5 pain pills on Monday and has no patches and she reports she called her  Primary care MD to request to transfer her care to Dr. Hardin Phillips with [facility]. She has areas of upper thoracolumbar fusion where spinal narrowing is apparent and there is probable looseing of hardware. She has difficulty  walking due to low back pain and bilateral hip pain. She has pain lying on the left side due to the first thoracic surgery with feeling of tightness on the right side were she had the thoracic surgery. She had 18 days post the thoracotomy an area of right lateral abdomenal wall prominence. She feels pain with  Extending her back with awful pain that comes on.   Review of Systems  Constitutional: Positive for activity change and fever. Negative for appetite change, chills, diaphoresis, fatigue and unexpected weight change.  HENT: Negative.  Negative for congestion, dental problem, drooling, ear discharge, ear pain, facial swelling, hearing loss, mouth sores, nosebleeds, postnasal drip and rhinorrhea.   Eyes:  Negative for photophobia, pain, discharge, redness and itching.  Respiratory: Positive for shortness of breath. Negative for apnea, cough, choking, chest tightness, wheezing and stridor.   Cardiovascular: Negative for chest pain, palpitations and leg swelling.  Gastrointestinal: Negative.  Negative for abdominal distention, abdominal pain, anal bleeding, blood in stool, constipation, diarrhea, nausea, rectal pain and vomiting.  Endocrine: Negative for cold intolerance, heat intolerance, polydipsia and polyphagia.  Genitourinary: Positive for flank pain. Negative for dyspareunia, dysuria and enuresis.  Musculoskeletal: Positive for arthralgias, back pain, gait problem, neck pain and neck stiffness.  Skin: Negative for color change, pallor, rash and wound.  Allergic/Immunologic: Negative for environmental allergies, food allergies and immunocompromised state.  Neurological: Positive for weakness and numbness (right foot numbness and into the right lateral upper thigh and right heel.). Negative for dizziness, tremors, seizures, syncope, facial asymmetry, speech difficulty, light-headedness and headaches.  Hematological: Negative for adenopathy. Does not bruise/bleed easily.  Psychiatric/Behavioral: Negative for agitation, behavioral problems, confusion, decreased concentration, dysphoric mood, hallucinations, self-injury, sleep disturbance and suicidal ideas. The patient is nervous/anxious. The patient is not hyperactive.      Objective: Vital Signs: BP (!) 191/104 (BP Location: Left Arm, Patient Position: Sitting)    Pulse 83    Ht 5\' 3"  (1.6 m)    Wt 151 lb (68.5 kg)    BMI 26.75 kg/m   Physical Exam Constitutional:      Appearance: She is well-developed.  HENT:     Head: Normocephalic and atraumatic.  Eyes:     Pupils: Pupils are equal, round, and reactive to light.  Neck:     Musculoskeletal: Normal range of motion and neck supple.  Pulmonary:     Effort: Pulmonary effort is normal.      Breath sounds: Normal breath sounds.  Abdominal:     General: Bowel sounds are normal.     Palpations: Abdomen is soft.  Skin:    General: Skin is warm and dry.  Neurological:     Mental Status: She is alert and oriented to person, place, and time.  Psychiatric:        Behavior: Behavior normal.        Thought Content: Thought content normal.        Judgment: Judgment normal.     Back Exam   Tenderness  The patient is experiencing tenderness in the lumbar and thoracic.  Range of Motion  Extension: abnormal  Flexion: abnormal  Lateral bend right: abnormal  Lateral bend left: abnormal  Rotation right: abnormal  Rotation left: abnormal   Tests  Straight leg raise right: negative Straight leg raise left: negative  Other  Toe walk: normal Heel walk: normal Sensation: normal Gait: normal  Erythema: no back redness Scars: absent      Specialty Comments:  No specialty comments  available.  Imaging: No results found.   PMFS History: Patient Active Problem List   Diagnosis Date Noted   Degenerative disc disease, lumbar 08/01/2015    Priority: High    Class: Chronic   Spondylolisthesis of lumbar region 08/01/2015    Priority: High    Class: Chronic   Facial trauma, subsequent encounter 12/08/2018   Headache 12/08/2018   Syncope 12/08/2018   COPD with exacerbation ([place]) 11/26/2018   Acute bronchitis 11/03/2018   Disorder of rotator cuff syndrome of left shoulder and allied disorder 06/22/2018   Leg edema, left 06/04/2018   Type 2 diabetes mellitus without complication, without long-term current use of insulin ([place]) 12/02/2017   Sacral pain 09/17/2017   COPD (chronic obstructive pulmonary disease) ([place]) 04/21/2017   Lower back pain 03/03/2017   CKD (chronic kidney disease) stage 3, GFR 30-59 ml/min (HCC) 08/07/2016   GERD (gastroesophageal reflux disease) 08/07/2016   Chronic respiratory failure ([place]) 07/29/2016   Hypersomnia 07/29/2016     Arthritis of carpometacarpal (CMC) joint of left thumb 07/09/2016   Pneumothorax on left 01/31/2016   Chronic, continuous use of opioids 09/06/2015   Urinary, incontinence, stress female 05/06/2014   Constipation 05/05/2014   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/14/2013   HTN (hypertension) 05/19/2013   HLD (hyperlipidemia) 05/19/2013   Depression 05/19/2013   Hypothyroidism 05/19/2013   Tobacco abuse    Osteoarthritis of both knees 11/18/2011   RLS (restless legs syndrome) 11/18/2011   Past Medical History:  Diagnosis Date   Active smoker    Anxiety    Calcifying tendinitis of shoulder    Chronic back pain    Chronic pain syndrome    COPD (chronic obstructive pulmonary disease) ([place])    Dysthymic disorder    Emphysema lung (HCC)    GERD (gastroesophageal reflux disease)    Headache    "weekly maybe" (01/31/2016)   Heart murmur    for years, nothing to be concerned about   Herpes genitalia    History of blood transfusion 1980   related to "back surgery"   Hyperlipidemia    Hypertension    Hypothyroidism    Lumbago    Osteoarthrosis, unspecified whether generalized or localized, lower leg    Pain in joint, upper arm    Pneumothorax, left 01/31/2016   S/P Left posterior subcostal pain injection on 01/30/2016   PONV (postoperative nausea and vomiting)    gets nauseous  with longer surgery. Difficuty voiding after surgery   Postlaminectomy syndrome, thoracic region    Primary localized osteoarthrosis, lower leg    Restless leg syndrome    Sleep apnea    s/p surgery- last sleep study 2011- doesnt use oxygen or machine at night as instructed,.   12/2014- Dr Halford Chessman  reports it is negative.   Thyroid disease     Family History  Problem Relation Age of Onset   Kidney disease Mother    Heart disease Father    Anuerysm Brother 88       brain   Heart disease Brother    Heart disease Sister 41       s/p CABG   Hypertension Sister    Colon cancer  Neg Hx     Past Surgical History:  Procedure Laterality Date   APPENDECTOMY     BACK SURGERY     18 back surgeries (2 thoracic & 16 lumbar) (01/31/2016)   DILATION AND CURETTAGE OF UTERUS     HAMMER TOE SURGERY     IR RADIOLOGIST EVAL &  MGMT  07/21/2017   JOINT REPLACEMENT     KNEE ARTHROSCOPY Right    LAPAROSCOPIC CHOLECYSTECTOMY     LUMBAR FUSION N/A 08/01/2015   Procedure: Right sided L1-2 and L2-3 transforaminal lumbar interbody fusion with cages, Extension of posterior fusion T12 to L3, Replaced pedicle screws bilaterally L1-L2 , Replaced left sided pedicle screws L-3. Instrumentation T12 to L3 using local bone graft, Vivigen allograft and cancellous chips;  Surgeon: [name], MD;  Location: [facility];  Service: Orthopedics;  Laterality: N/A;   LUMBAR LAMINECTOMY/DECOMPRESSION MICRODISCECTOMY N/A 01/28/2014   Procedure: Minimally Invasive Right  L1-2 Microdiscectomy;  Surgeon: [name], MD;  Location: [facility];  Service: Orthopedics;  Laterality: N/A;   TOTAL HIP ARTHROPLASTY Right    TOTAL KNEE ARTHROPLASTY  05/29/2012   Procedure: TOTAL KNEE ARTHROPLASTY;  Surgeon: Mcarthur Rossetti, MD;  Location: [facility];  Service: Orthopedics;  Laterality: Right;  Right Total Knee Arthroplasty   TUBAL LIGATION     UVULOPALATOPHARYNGOPLASTY     VAGINAL HYSTERECTOMY     Social History   Occupational History   Occupation: disability    Comment: back surgeries  Tobacco Use   Smoking status: Current Every Day Smoker    Packs/day: 1.25    Years: 45.00    Pack years: 56.25    Types: Cigarettes   Smokeless tobacco: Never Used   Tobacco comment: form given 12/25/16  Substance and Sexual Activity   Alcohol use: No    Alcohol/week: 0.0 standard drinks   Drug use: No    Types: Marijuana    Comment: 01/31/2016 "none since ~ 1980"   Sexual activity: Never    Partners: Male

## 2019-04-02 ENCOUNTER — Telehealth: Payer: Self-pay | Admitting: Specialist

## 2019-04-02 NOTE — Telephone Encounter (Signed)
Returned call to patient left message to return call to schedule a 2 wk follow up with Dr [name]   Patient said there is also a test or x-ray she was told she is suppose to have. Patient said she don't remember what she was told. The number to contact patient is [phone]

## 2019-04-03 ENCOUNTER — Other Ambulatory Visit: Payer: Self-pay | Admitting: Internal Medicine

## 2019-04-05 ENCOUNTER — Other Ambulatory Visit: Payer: Self-pay | Admitting: Radiology

## 2019-04-05 DIAGNOSIS — M4325 Fusion of spine, thoracolumbar region: Secondary | ICD-10-CM

## 2019-04-05 NOTE — Telephone Encounter (Signed)
I called and lmom advising her that I sched for 04/26/2019 @ 230pm, and advised that she needs to sched a CT scan @ [facility] and she can get her Scoliosis xrays done when she goes for the CT scan as the xrays are done by walk-in from 8am-4pm Monday thru Friday

## 2019-04-06 ENCOUNTER — Telehealth: Payer: Self-pay | Admitting: Specialist

## 2019-04-06 NOTE — Telephone Encounter (Signed)
I called and lmom that the rx must last her 7 days as insurance will not cover it if it is less than that.

## 2019-04-06 NOTE — Telephone Encounter (Signed)
Patient left a message stating that Dr. [name] prescribed her Hydrocodone and was told to take 2 pills every 6 hours and if she takes them like that her RX will only last her 5 days.  She wanted to know if she can take the 2 pills every 6 hours and when she runs out, will Dr. [name] refill her RX.  [place]#[phone].  Thank you.

## 2019-04-08 ENCOUNTER — Other Ambulatory Visit: Payer: Self-pay | Admitting: Specialist

## 2019-04-08 NOTE — Telephone Encounter (Signed)
norco refill request 

## 2019-04-13 ENCOUNTER — Other Ambulatory Visit: Payer: Self-pay | Admitting: Internal Medicine

## 2019-04-14 ENCOUNTER — Telehealth: Payer: Self-pay | Admitting: *Deleted

## 2019-04-14 ENCOUNTER — Other Ambulatory Visit: Payer: Self-pay | Admitting: Specialist

## 2019-04-14 DIAGNOSIS — M960 Pseudarthrosis after fusion or arthrodesis: Secondary | ICD-10-CM

## 2019-04-14 DIAGNOSIS — M4325 Fusion of spine, thoracolumbar region: Secondary | ICD-10-CM

## 2019-04-14 DIAGNOSIS — M5134 Other intervertebral disc degeneration, thoracic region: Secondary | ICD-10-CM

## 2019-04-14 NOTE — Telephone Encounter (Signed)
Wells Guiles with [facility] called stating they are needing a CT T-Spine to go along with the Lumbar CT to cover the entire area since it is to up to the T7. Order has been placed.

## 2019-04-15 ENCOUNTER — Ambulatory Visit
Admission: RE | Admit: 2019-04-15 | Discharge: 2019-04-15 | Disposition: A | Discharge status: Home / Self Care | Payer: Medicare Other | Source: Ambulatory Visit | Attending: Specialist | Admitting: Specialist

## 2019-04-15 DIAGNOSIS — M5134 Other intervertebral disc degeneration, thoracic region: Secondary | ICD-10-CM

## 2019-04-15 DIAGNOSIS — M4327 Fusion of spine, lumbosacral region: Secondary | ICD-10-CM | POA: Diagnosis not present

## 2019-04-15 DIAGNOSIS — M4324 Fusion of spine, thoracic region: Secondary | ICD-10-CM | POA: Diagnosis not present

## 2019-04-15 DIAGNOSIS — M4325 Fusion of spine, thoracolumbar region: Secondary | ICD-10-CM | POA: Diagnosis not present

## 2019-04-15 DIAGNOSIS — M960 Pseudarthrosis after fusion or arthrodesis: Secondary | ICD-10-CM

## 2019-04-15 DIAGNOSIS — M5124 Other intervertebral disc displacement, thoracic region: Secondary | ICD-10-CM | POA: Diagnosis not present

## 2019-04-15 DIAGNOSIS — J439 Emphysema, unspecified: Secondary | ICD-10-CM | POA: Diagnosis not present

## 2019-04-20 ENCOUNTER — Other Ambulatory Visit: Payer: Self-pay | Admitting: Specialist

## 2019-04-20 MED ORDER — TIZANIDINE HCL 4 MG PO TABS: 4.0000 mg | ORAL_TABLET | Freq: Three times a day (TID) | ORAL | 0 refills | Status: DC | PRN

## 2019-04-20 NOTE — Telephone Encounter (Signed)
rx request sent to Dr. [name].

## 2019-04-20 NOTE — Telephone Encounter (Signed)
Pt called in requesting a prescription for Tizandine 4MG  for muscle spasms.  [place]  [phone]

## 2019-04-22 ENCOUNTER — Other Ambulatory Visit: Payer: Self-pay | Admitting: Specialist

## 2019-04-22 NOTE — Telephone Encounter (Signed)
Please advise.  Rx refill.  Thank You

## 2019-04-26 ENCOUNTER — Ambulatory Visit (INDEPENDENT_AMBULATORY_CARE_PROVIDER_SITE_OTHER): Payer: Medicare Other | Admitting: Specialist

## 2019-04-26 ENCOUNTER — Encounter: Payer: Self-pay | Admitting: Specialist

## 2019-04-26 VITALS — BP 159/95 | HR 110 | Ht 63.0 in | Wt 145.0 lb

## 2019-04-26 DIAGNOSIS — M4316 Spondylolisthesis, lumbar region: Secondary | ICD-10-CM

## 2019-04-26 DIAGNOSIS — M5414 Radiculopathy, thoracic region: Secondary | ICD-10-CM | POA: Diagnosis not present

## 2019-04-26 DIAGNOSIS — M960 Pseudarthrosis after fusion or arthrodesis: Secondary | ICD-10-CM | POA: Diagnosis not present

## 2019-04-26 DIAGNOSIS — M5134 Other intervertebral disc degeneration, thoracic region: Secondary | ICD-10-CM

## 2019-04-26 DIAGNOSIS — M75102 Unspecified rotator cuff tear or rupture of left shoulder, not specified as traumatic: Secondary | ICD-10-CM | POA: Diagnosis not present

## 2019-04-26 DIAGNOSIS — IMO0002 Reserved for concepts with insufficient information to code with codable children: Secondary | ICD-10-CM

## 2019-04-26 DIAGNOSIS — M4724 Other spondylosis with radiculopathy, thoracic region: Secondary | ICD-10-CM

## 2019-04-26 MED ORDER — HYDROCODONE-ACETAMINOPHEN 10-325 MG PO TABS: 1.0000 | ORAL_TABLET | Freq: Four times a day (QID) | ORAL | 0 refills | Status: DC | PRN

## 2019-04-26 MED ORDER — FENTANYL 50 MCG/HR TD PT72: 1.0000 | MEDICATED_PATCH | TRANSDERMAL | 0 refills | Status: DC

## 2019-04-26 NOTE — Patient Instructions (Signed)
Plan: Avoid bending, stooping and avoid lifting weights greater than 10 lbs. Avoid prolong standing and walking. Order for a new walker with wheels. Surgery scheduling secretary Kandice Hams, will call you in the next week to schedule for surgery.  Surgery recommended is a 4 level thoraco-lumbar fusionT7, T8, T9, T10 to T11 to S1-S2 this would be done with rods, screws and cages with local bone graft and allograft (donor bone graft). Exploration of T12-L1 and L2-3 fusion with rearthrodesis of these levels if necessary. Plan to perform osteotomies to realign the spine and decrease the kyphosis or tendency to stoop.  Take hydrocodone for for pain. Risk of surgery includes risk of infection 1 in 200 patients, bleeding 1/2% chance you would need a transfusion.   Risk to the nerves is one in 10,000. You will need to use a brace for 3 months and wean from the brace on the 4th month. Expect improved walking and standing tolerance. Expect relief of leg pain but numbness may persist depending on the length and degree of pressure that has been present.

## 2019-04-26 NOTE — Progress Notes (Signed)
Office Visit Note   Patient: Jill Phillips           Date of Birth: 1956/06/06           MRN: [id number] Visit Date: 04/26/2019              Requested by: [name], MD [place],  [place] [address] PCP: [name], MD   Assessment & Plan: Visit Diagnoses: No diagnosis found.  Plan: Avoid bending, stooping and avoid lifting weights greater than 10 lbs. Avoid prolong standing and walking. Order for a new walker with wheels. Surgery scheduling secretary Kandice Hams, will call you in the next week to schedule for surgery.  Surgery recommended is a 4 level thoraco-lumbar fusionT7, T8, T9, T10 to T11 to S1-S2 this would be done with rods, screws and cages with local bone graft and allograft (donor bone graft). Exploration of T12-L1 and L2-3 fusion with rearthrodesis of these levels if necessary. Plan to perform osteotomies to realign the spine and decrease the kyphosis or tendency to stoop.  Take hydrocodone for for pain. Risk of surgery includes risk of infection 1 in 200 patients, bleeding 1/2% chance you would need a transfusion.   Risk to the nerves is one in 10,000. You will need to use a brace for 3 months and wean from the brace on the 4th month. Expect improved walking and standing tolerance. Expect relief of leg pain but numbness may persist depending on the length and degree of pressure that has been present.    Follow-Up Instructions: No follow-ups on file.   Orders:  No orders of the defined types were placed in this encounter.  No orders of the defined types were placed in this encounter.     Procedures: No procedures performed   Clinical Data: No additional findings.   Subjective: Chief Complaint  Patient presents with  . Lower Back - Follow-up    HPI  Review of Systems   Objective: Vital Signs: BP (!) 159/95 (BP Location: Left Arm, Patient Position: Sitting)   Pulse (!) 110   Ht 5\' 3"  (1.6 m)   Wt 145 lb (65.8 kg)   BMI  25.69 kg/m   Physical Exam  Ortho Exam  Specialty Comments:  No specialty comments available.  Imaging: No results found.   PMFS History: Patient Active Problem List   Diagnosis Date Noted  . Degenerative disc disease, lumbar 08/01/2015    Priority: High    Class: Chronic  . Spondylolisthesis of lumbar region 08/01/2015    Priority: High    Class: Chronic  . Facial trauma, subsequent encounter 12/08/2018  . Headache 12/08/2018  . Syncope 12/08/2018  . COPD with exacerbation ([place]) 11/26/2018  . Acute bronchitis 11/03/2018  . Disorder of rotator cuff syndrome of left shoulder and allied disorder 06/22/2018  . Leg edema, left 06/04/2018  . Type 2 diabetes mellitus without complication, without long-term current use of insulin ([place]) 12/02/2017  . Sacral pain 09/17/2017  . COPD (chronic obstructive pulmonary disease) ([place]) 04/21/2017  . Lower back pain 03/03/2017  . CKD (chronic kidney disease) stage 3, GFR 30-59 ml/min (HCC) 08/07/2016  . GERD (gastroesophageal reflux disease) 08/07/2016  . Chronic respiratory failure ([place]) 07/29/2016  . Hypersomnia 07/29/2016  . Arthritis of carpometacarpal [facility]) joint of left thumb 07/09/2016  . Pneumothorax on left 01/31/2016  . Chronic, continuous use of opioids 09/06/2015  . Urinary, incontinence, stress female 05/06/2014  . Constipation 05/05/2014  . HSV  infection 07/14/2013  . HTN (hypertension) 05/19/2013  . HLD (hyperlipidemia) 05/19/2013  . Depression 05/19/2013  . Hypothyroidism 05/19/2013  . Tobacco abuse   . Osteoarthritis of both knees 11/18/2011  . RLS (restless legs syndrome) 11/18/2011   Past Medical History:  Diagnosis Date  . Active smoker   . Anxiety   . Calcifying tendinitis of shoulder   . Chronic back pain   . Chronic pain syndrome   . COPD (chronic obstructive pulmonary disease) ([place])   . Dysthymic disorder   . Emphysema lung ([place])   . GERD (gastroesophageal reflux disease)   . Headache    "weekly  maybe" (01/31/2016)  . Heart murmur    for years, nothing to be concerned about  . Herpes genitalia   . History of blood transfusion 1980   related to "back surgery"  . Hyperlipidemia   . Hypertension   . Hypothyroidism   . Lumbago   . Osteoarthrosis, unspecified whether generalized or localized, lower leg   . Pain in joint, upper arm   . Pneumothorax, left 01/31/2016   S/P Left posterior subcostal pain injection on 01/30/2016  . PONV (postoperative nausea and vomiting)    gets nauseous  with longer surgery. Difficuty voiding after surgery  . Postlaminectomy syndrome, thoracic region   . Primary localized osteoarthrosis, lower leg   . Restless leg syndrome   . Sleep apnea    s/p surgery- last sleep study 2011- doesnt use oxygen or machine at night as instructed,.   12/2014- Dr Halford Chessman  reports it is negative.  . Thyroid disease     Family History  Problem Relation Age of Onset  . Kidney disease Mother   . Heart disease Father   . Anuerysm Brother 29       brain  . Heart disease Brother   . Heart disease Sister 66       s/p CABG  . Hypertension Sister   . Colon cancer Neg Hx     Past Surgical History:  Procedure Laterality Date  . APPENDECTOMY    . BACK SURGERY     18 back surgeries (2 thoracic & 16 lumbar) (01/31/2016)  . DILATION AND CURETTAGE OF UTERUS    . HAMMER TOE SURGERY    . IR RADIOLOGIST EVAL & MGMT  07/21/2017  . JOINT REPLACEMENT    . KNEE ARTHROSCOPY Right   . LAPAROSCOPIC CHOLECYSTECTOMY    . LUMBAR FUSION N/A 08/01/2015   Procedure: Right sided L1-2 and L2-3 transforaminal lumbar interbody fusion with cages, Extension of posterior fusion T12 to L3, Replaced pedicle screws bilaterally L1-L2 , Replaced left sided pedicle screws L-3. Instrumentation T12 to L3 using local bone graft, Vivigen allograft and cancellous chips;  Surgeon: [name], MD;  Location: [facility];  Service: Orthopedics;  Laterality: N/A;  . LUMBAR LAMINECTOMY/DECOMPRESSION MICRODISCECTOMY N/A  01/28/2014   Procedure: Minimally Invasive Right  L1-2 Microdiscectomy;  Surgeon: [name], MD;  Location: [facility];  Service: Orthopedics;  Laterality: N/A;  . TOTAL HIP ARTHROPLASTY Right   . TOTAL KNEE ARTHROPLASTY  05/29/2012   Procedure: TOTAL KNEE ARTHROPLASTY;  Surgeon: Mcarthur Rossetti, MD;  Location: [facility];  Service: Orthopedics;  Laterality: Right;  Right Total Knee Arthroplasty  . TUBAL LIGATION    . UVULOPALATOPHARYNGOPLASTY    . VAGINAL HYSTERECTOMY     Social History   Occupational History  . Occupation: disability    Comment: back surgeries  Tobacco Use  . Smoking status: Current Every  Day Smoker    Packs/day: 1.25    Years: 45.00    Pack years: 56.25    Types: Cigarettes  . Smokeless tobacco: Never Used  . Tobacco comment: form given 12/25/16  Substance and Sexual Activity  . Alcohol use: No    Alcohol/week: 0.0 standard drinks  . Drug use: No    Types: Marijuana    Comment: 01/31/2016 "none since ~ 1980"  . Sexual activity: Never    Partners: Male

## 2019-04-28 ENCOUNTER — Other Ambulatory Visit: Payer: Self-pay | Admitting: Internal Medicine

## 2019-04-28 NOTE — Telephone Encounter (Signed)
Next OV is 05/05/19 Last OV 11/03/18 Last RF 03/31/19

## 2019-04-30 ENCOUNTER — Other Ambulatory Visit: Payer: Self-pay | Admitting: Specialist

## 2019-05-03 ENCOUNTER — Other Ambulatory Visit: Payer: Self-pay | Admitting: Internal Medicine

## 2019-05-04 ENCOUNTER — Other Ambulatory Visit: Payer: Self-pay | Admitting: Internal Medicine

## 2019-05-04 DIAGNOSIS — F419 Anxiety disorder, unspecified: Secondary | ICD-10-CM | POA: Insufficient documentation

## 2019-05-04 DIAGNOSIS — G4700 Insomnia, unspecified: Secondary | ICD-10-CM | POA: Insufficient documentation

## 2019-05-04 NOTE — Progress Notes (Signed)
Subjective:    Patient ID: Jill Phillips, female    DOB: 1956/02/26, 63 y.o.   MRN: [id number]  HPI The patient is here for follow up.     Medications and allergies reviewed with patient and updated if appropriate.  Patient Active Problem List   Diagnosis Date Noted  . Facial trauma, subsequent encounter 12/08/2018  . Headache 12/08/2018  . Syncope 12/08/2018  . COPD with exacerbation ([place]) 11/26/2018  . Acute bronchitis 11/03/2018  . Disorder of rotator cuff syndrome of left shoulder and allied disorder 06/22/2018  . Leg edema, left 06/04/2018  . Type 2 diabetes mellitus without complication, without long-term current use of insulin ([place]) 12/02/2017  . Sacral pain 09/17/2017  . COPD (chronic obstructive pulmonary disease) ([place]) 04/21/2017  . Lower back pain 03/03/2017  . CKD (chronic kidney disease) stage 3, GFR 30-59 ml/min (HCC) 08/07/2016  . GERD (gastroesophageal reflux disease) 08/07/2016  . Chronic respiratory failure ([place]) 07/29/2016  . Hypersomnia 07/29/2016  . Arthritis of carpometacarpal [facility]) joint of left thumb 07/09/2016  . Pneumothorax on left 01/31/2016  . Chronic, continuous use of opioids 09/06/2015  . Degenerative disc disease, lumbar 08/01/2015    Class: Chronic  . Spondylolisthesis of lumbar region 08/01/2015    Class: Chronic  . Urinary, incontinence, stress female 05/06/2014  . Constipation 05/05/2014  . 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 HTN (hypertension) 05/19/2013  . HLD (hyperlipidemia) 05/19/2013  . Depression 05/19/2013  . Hypothyroidism 05/19/2013  . Tobacco abuse   . Osteoarthritis of both knees 11/18/2011  . RLS (restless legs syndrome) 11/18/2011    Current Outpatient Medications on File Prior to Visit  Medication Sig Dispense Refill  . albuterol (PROAIR HFA) 108 (90 Base) MCG/ACT inhaler INHALE 1 PUFF INTO THE LUNGS EVERY 6 HOURS AS NEEDED FOR WHEEZING OR SHORTNESS OF BREATH.NEED OFFICE VISIT FOR FURTHER REFILLS 8.5 g 0  . amLODipine  (NORVASC) 10 MG tablet TAKE 1 TABLET BY MOUTH DAILY. 90 tablet 1  . aspirin-acetaminophen-caffeine (EXCEDRIN MIGRAINE) 250-250-65 MG tablet Take 1 tablet by mouth every 6 (six) hours as needed for headache.    [name] atorvastatin (LIPITOR) 20 MG tablet TAKE 1 TABLET BY MOUTH DAILY. 90 tablet 0  . benazepril (LOTENSIN) 40 MG tablet TAKE 1 TABLET BY MOUTH DAILY. 90 tablet 0  . Blood Glucose Monitoring Suppl (ONETOUCH VERIO FLEX SYSTEM) w/Device KIT USE TO TEST BLOOD SUGAR UP TO 4 TIMES A DAY 1 kit 0  . clonazePAM (KLONOPIN) 1 MG tablet TAKE 1 TABLET BY MOUTH 2 TIMES DAILY AS NEEDED FOR ANXIETY 60 tablet 0  . diphenhydrAMINE (BENADRYL) 25 MG tablet Take 25-50 mg by mouth every 6 (six) hours as needed (bladder).    . fentaNYL (DURAGESIC) 50 MCG/HR PLACE 1 PATCH ONTO THE SKIN EVERY 3 (THREE) DAYS. 5 patch 0  . fentaNYL (DURAGESIC) 50 MCG/HR Place 1 patch onto the skin every 3 (three) days. 10 patch 0  . gabapentin (NEURONTIN) 300 MG capsule Take 1 capsule (300 mg total) by mouth 3 (three) times daily. 150 capsule 5  . HYDROcodone-acetaminophen (NORCO) 10-325 MG tablet TAKE 2 TABLETS BY MOUTH EVERY 6 HOURS AS NEEDED 40 tablet 0  . HYDROcodone-acetaminophen (NORCO) 10-325 MG tablet Take 1 tablet by mouth every 6 (six) hours as needed. 120 tablet 0  . levothyroxine (SYNTHROID) 88 MCG tablet TAKE 1 TABLET BY MOUTH DAILY. 90 tablet 1  . Meth-Hyo-M Bl-Na Phos-Ph Sal (URIBEL) 118 MG CAPS Take by mouth 3 (three) times daily.     [name]  omeprazole (PRILOSEC) 40 MG capsule TAKE 1 CAPSULE BY MOUTH 2 TIMES DAILY BEFORE A MEAL. 180 capsule 3  . PROAIR HFA 108 (90 Base) MCG/ACT inhaler INHALE 2 PUFFS INTO THE LUNGS EVERY 6 HOURS AS NEEDED FOR WHEEZING OR SHORTNESS OF BREATH. 8 g 0  . QUEtiapine (SEROQUEL) 100 MG tablet Take 1 tablet (100 mg total) by mouth at bedtime. 30 tablet 2  . rOPINIRole (REQUIP) 0.5 MG tablet TAKE 2 TABLETS BY MOUTH AT BEDTIME. 60 tablet 2  . Sennosides (SENNA LAX PO) Take 1 tablet by mouth daily. As  directed    . SPIRIVA HANDIHALER 18 MCG inhalation capsule PLACE 1 CAPSULE INTO INHALER AND INHALE ONCE DAILY. 30 capsule 0  . tiZANidine (ZANAFLEX) 4 MG tablet TAKE 1 TABLET BY MOUTH EVERY 8 HOURS AS NEEDED FOR MUSCLE SPASMS. 30 tablet 0  . valACYclovir (VALTREX) 500 MG tablet TAKE 1 TABLET BY MOUTH DAILY. 60 tablet 0  . venlafaxine XR (EFFEXOR-XR) 150 MG 24 hr capsule TAKE 2 CAPSULES BY MOUTH DAILY. 180 capsule 0  . Vitamin D, Ergocalciferol, (DRISDOL) 50000 units CAPS capsule Take 1 capsule (50,000 Units total) by mouth every 7 (seven) days. 8 capsule 0   Current Facility-Administered Medications on File Prior to Visit  Medication Dose Route Frequency Provider Last Rate Last Dose  . calcitonin (salmon) (MIACALCIN/FORTICAL) nasal spray 1 spray  1 spray Alternating Nares Daily [name], MD        Past Medical History:  Diagnosis Date  . Active smoker   . Anxiety   . Calcifying tendinitis of shoulder   . Chronic back pain   . Chronic pain syndrome   . COPD (chronic obstructive pulmonary disease) ([place])   . Dysthymic disorder   . Emphysema lung ([place])   . GERD (gastroesophageal reflux disease)   . Headache    "weekly maybe" (01/31/2016)  . Heart murmur    for years, nothing to be concerned about  . Herpes genitalia   . History of blood transfusion 1980   related to "back surgery"  . Hyperlipidemia   . Hypertension   . Hypothyroidism   . Lumbago   . Osteoarthrosis, unspecified whether generalized or localized, lower leg   . Pain in joint, upper arm   . Pneumothorax, left 01/31/2016   S/P Left posterior subcostal pain injection on 01/30/2016  . PONV (postoperative nausea and vomiting)    gets nauseous  with longer surgery. Difficuty voiding after surgery  . Postlaminectomy syndrome, thoracic region   . Primary localized osteoarthrosis, lower leg   . Restless leg syndrome   . Sleep apnea    s/p surgery- last sleep study 2011- doesnt use oxygen or machine at night as  instructed,.   12/2014- Dr Halford Chessman  reports it is negative.  . Thyroid disease     Past Surgical History:  Procedure Laterality Date  . APPENDECTOMY    . BACK SURGERY     18 back surgeries (2 thoracic & 16 lumbar) (01/31/2016)  . DILATION AND CURETTAGE OF UTERUS    . HAMMER TOE SURGERY    . IR RADIOLOGIST EVAL & MGMT  07/21/2017  . JOINT REPLACEMENT    . KNEE ARTHROSCOPY Right   . LAPAROSCOPIC CHOLECYSTECTOMY    . LUMBAR FUSION N/A 08/01/2015   Procedure: Right sided L1-2 and L2-3 transforaminal lumbar interbody fusion with cages, Extension of posterior fusion T12 to L3, Replaced pedicle screws bilaterally L1-L2 , Replaced left sided pedicle screws L-3. Instrumentation T12 to L3  using local bone graft, Vivigen allograft and cancellous chips;  Surgeon: [name], MD;  Location: [facility];  Service: Orthopedics;  Laterality: N/A;  . LUMBAR LAMINECTOMY/DECOMPRESSION MICRODISCECTOMY N/A 01/28/2014   Procedure: Minimally Invasive Right  L1-2 Microdiscectomy;  Surgeon: [name], MD;  Location: [facility];  Service: Orthopedics;  Laterality: N/A;  . TOTAL HIP ARTHROPLASTY Right   . TOTAL KNEE ARTHROPLASTY  05/29/2012   Procedure: TOTAL KNEE ARTHROPLASTY;  Surgeon: Mcarthur Rossetti, MD;  [facility];  Service: Orthopedics;  Laterality: Right;  Right Total Knee Arthroplasty  . TUBAL LIGATION    . UVULOPALATOPHARYNGOPLASTY    . VAGINAL HYSTERECTOMY      Social History   Socioeconomic History  . Marital status: Divorced    Spouse name: n/a  . Number of children: 2  . Years of education: 12+  . Highest education level: Not on file  Occupational History  . Occupation: disability    Comment: back surgeries  Social Needs  . Financial resource strain: Not on file  . Food insecurity    Worry: Not on file    Inability: Not on file  . Transportation needs    Medical: Not on file    Non-medical: Not on file  Tobacco Use  . Smoking status: Current Every Day Smoker    Packs/day: 1.25     Years: 45.00    Pack years: 56.25    Types: Cigarettes  . Smokeless tobacco: Never Used  . Tobacco comment: form given 12/25/16  Substance and Sexual Activity  . Alcohol use: No    Alcohol/week: 0.0 standard drinks  . Drug use: No    Types: Marijuana    Comment: 01/31/2016 "none since ~ 1980"  . Sexual activity: Never    Partners: Male  Lifestyle  . Physical activity    Days per week: Not on file    Minutes per session: Not on file  . Stress: Not on file  Relationships  . Social Herbalist on phone: Not on file    Gets together: Not on file    Attends religious service: Not on file    Active member of club or organization: Not on file    Attends meetings of clubs or organizations: Not on file    Relationship status: Not on file  Other Topics Concern  . Not on file  Social History Narrative   Lives alone.  One daughter is local, but is getting ready to move to [place], where her children live with their father.  The other daughter lives near [place], [place].    Family History  Problem Relation Age of Onset  . Kidney disease Mother   . Heart disease Father   . Anuerysm Brother 29       brain  . Heart disease Brother   . Heart disease Sister 39       s/p CABG  . Hypertension Sister   . Colon cancer Neg Hx     Review of Systems     Objective:  There were no vitals filed for this visit. BP Readings from Last 3 Encounters:  04/26/19 (!) 159/95  04/01/19 (!) 191/104  02/25/19 (!) 160/98   Wt Readings from Last 3 Encounters:  04/26/19 145 lb (65.8 kg)  04/01/19 151 lb (68.5 kg)  02/25/19 151 lb (68.5 kg)   There is no height or weight on file to calculate BMI.   Physical Exam  Assessment & Plan:    See Problem List for Assessment and Plan of chronic medical problems.   This encounter was created in error - please disregard.

## 2019-05-04 NOTE — Patient Instructions (Signed)

## 2019-05-05 ENCOUNTER — Encounter: Payer: Medicare Other | Admitting: Internal Medicine

## 2019-05-07 ENCOUNTER — Other Ambulatory Visit: Payer: Self-pay | Admitting: Internal Medicine

## 2019-05-14 ENCOUNTER — Telehealth: Payer: Self-pay | Admitting: Specialist

## 2019-05-14 NOTE — Telephone Encounter (Signed)
Must take medication as Dr. [name] prescribed

## 2019-05-14 NOTE — Telephone Encounter (Signed)
Can you please advise?

## 2019-05-14 NOTE — Telephone Encounter (Signed)
Patient called wanting to know if she can take 1 1/2 pain pill because she is in pain.  Please call patient to advise.  [phone]

## 2019-05-14 NOTE — Telephone Encounter (Signed)
I called and advised that she should continue to take her meds as prescribed. She states that she has been but her shoulder is killing her right now.

## 2019-05-19 ENCOUNTER — Other Ambulatory Visit: Payer: Self-pay | Admitting: Specialist

## 2019-05-19 ENCOUNTER — Other Ambulatory Visit: Payer: Medicare Other

## 2019-05-21 ENCOUNTER — Other Ambulatory Visit: Payer: Self-pay | Admitting: Specialist

## 2019-05-21 MED ORDER — TIZANIDINE HCL 4 MG PO TABS: ORAL_TABLET | ORAL | 0 refills | Status: DC

## 2019-05-21 NOTE — Telephone Encounter (Signed)
Request has been sent to Dr. Nitka 

## 2019-05-21 NOTE — Telephone Encounter (Signed)
Patient called. Says she needs a refill on her Tizanidine. Her call back number is [phone]

## 2019-05-21 NOTE — Addendum Note (Signed)
Addended by: Minda Ditto, Alyse Low N on: 05/21/2019 11:57 AM   Modules accepted: Orders

## 2019-05-21 NOTE — Telephone Encounter (Signed)
Left Vm advising patient that Rx was sent to her pahrmacy.

## 2019-05-21 NOTE — Telephone Encounter (Signed)
Duplicate

## 2019-05-24 ENCOUNTER — Other Ambulatory Visit: Payer: Self-pay | Admitting: Specialist

## 2019-05-24 DIAGNOSIS — M4316 Spondylolisthesis, lumbar region: Secondary | ICD-10-CM

## 2019-05-24 DIAGNOSIS — M75102 Unspecified rotator cuff tear or rupture of left shoulder, not specified as traumatic: Secondary | ICD-10-CM

## 2019-05-24 DIAGNOSIS — IMO0002 Reserved for concepts with insufficient information to code with codable children: Secondary | ICD-10-CM

## 2019-05-26 ENCOUNTER — Ambulatory Visit (INDEPENDENT_AMBULATORY_CARE_PROVIDER_SITE_OTHER): Payer: Medicare Other | Admitting: Specialist

## 2019-05-26 ENCOUNTER — Encounter: Payer: Self-pay | Admitting: Specialist

## 2019-05-26 ENCOUNTER — Other Ambulatory Visit: Payer: Self-pay | Admitting: Specialist

## 2019-05-26 VITALS — BP 140/88 | HR 103 | Ht 63.0 in | Wt 145.0 lb

## 2019-05-26 DIAGNOSIS — M75102 Unspecified rotator cuff tear or rupture of left shoulder, not specified as traumatic: Secondary | ICD-10-CM

## 2019-05-26 DIAGNOSIS — K6289 Other specified diseases of anus and rectum: Secondary | ICD-10-CM

## 2019-05-26 DIAGNOSIS — M4316 Spondylolisthesis, lumbar region: Secondary | ICD-10-CM

## 2019-05-26 DIAGNOSIS — IMO0002 Reserved for concepts with insufficient information to code with codable children: Secondary | ICD-10-CM

## 2019-05-26 DIAGNOSIS — G8929 Other chronic pain: Secondary | ICD-10-CM

## 2019-05-26 DIAGNOSIS — M5134 Other intervertebral disc degeneration, thoracic region: Secondary | ICD-10-CM

## 2019-05-27 ENCOUNTER — Other Ambulatory Visit: Payer: Self-pay

## 2019-05-27 ENCOUNTER — Telehealth: Payer: Self-pay | Admitting: Specialist

## 2019-05-27 ENCOUNTER — Ambulatory Visit
Admission: RE | Admit: 2019-05-27 | Discharge: 2019-05-27 | Disposition: A | Discharge status: Home / Self Care | Payer: Medicare Other | Source: Ambulatory Visit | Attending: Specialist | Admitting: Specialist

## 2019-05-27 DIAGNOSIS — M5414 Radiculopathy, thoracic region: Secondary | ICD-10-CM

## 2019-05-27 DIAGNOSIS — M960 Pseudarthrosis after fusion or arthrodesis: Secondary | ICD-10-CM

## 2019-05-27 DIAGNOSIS — Z13820 Encounter for screening for osteoporosis: Secondary | ICD-10-CM | POA: Diagnosis not present

## 2019-05-27 DIAGNOSIS — M5134 Other intervertebral disc degeneration, thoracic region: Secondary | ICD-10-CM

## 2019-05-27 NOTE — Telephone Encounter (Signed)
Pt called in requesting that dr.nitka refer her to [facility].  Please give pt a call [phone]

## 2019-05-28 ENCOUNTER — Other Ambulatory Visit: Payer: Self-pay | Admitting: Radiology

## 2019-05-28 ENCOUNTER — Other Ambulatory Visit: Payer: Self-pay | Admitting: Internal Medicine

## 2019-05-28 DIAGNOSIS — M960 Pseudarthrosis after fusion or arthrodesis: Secondary | ICD-10-CM

## 2019-05-28 DIAGNOSIS — M5134 Other intervertebral disc degeneration, thoracic region: Secondary | ICD-10-CM

## 2019-05-28 DIAGNOSIS — M5414 Radiculopathy, thoracic region: Secondary | ICD-10-CM

## 2019-05-28 DIAGNOSIS — M4325 Fusion of spine, thoracolumbar region: Secondary | ICD-10-CM

## 2019-05-28 DIAGNOSIS — M75102 Unspecified rotator cuff tear or rupture of left shoulder, not specified as traumatic: Secondary | ICD-10-CM

## 2019-05-28 DIAGNOSIS — M4316 Spondylolisthesis, lumbar region: Secondary | ICD-10-CM

## 2019-05-28 DIAGNOSIS — IMO0002 Reserved for concepts with insufficient information to code with codable children: Secondary | ICD-10-CM

## 2019-05-28 NOTE — Telephone Encounter (Signed)
Last RF was 04/29/19 Last OV 05/05/19 Next OV NA

## 2019-05-28 NOTE — Telephone Encounter (Signed)
I placed order for pain management

## 2019-05-31 ENCOUNTER — Other Ambulatory Visit: Payer: Self-pay | Admitting: Specialist

## 2019-06-01 ENCOUNTER — Telehealth: Payer: Self-pay | Admitting: Specialist

## 2019-06-01 NOTE — Telephone Encounter (Signed)
Patient called to talk to you in regards to her surgery.  Patient also states that she did not get your messages and that is why she did not call you back.  She will have her ringer up and her phone on her at all times.  PT#[phone].  Thank you.

## 2019-06-01 NOTE — Telephone Encounter (Signed)
Jill Phillips with [facility] called in said they received a prescription refill for the pt for Tizanidine and she is wondering if Jill Phillips can change that prescription to a 30-day supply (90 tablets) instead of 10 day supply due to pt frequently calling in every 10 days to refill and makes them have to go out to pt to deliver frequently.    [phone]

## 2019-06-02 NOTE — Telephone Encounter (Signed)
I called patient and left voice mail for return call. °

## 2019-06-03 ENCOUNTER — Other Ambulatory Visit: Payer: Self-pay | Admitting: Specialist

## 2019-06-03 MED ORDER — TIZANIDINE HCL 4 MG PO CAPS: 4.0000 mg | ORAL_CAPSULE | Freq: Three times a day (TID) | ORAL | 6 refills | Status: DC

## 2019-06-03 NOTE — Telephone Encounter (Signed)
Ok per Dr. [name] to do a 30day supply on trhe next order,

## 2019-06-04 NOTE — Telephone Encounter (Signed)
Spoke with patient yesterday and scheduled.

## 2019-06-04 NOTE — Telephone Encounter (Signed)
Dr. [name] sent new rx to pharm

## 2019-06-10 ENCOUNTER — Ambulatory Visit: Payer: Medicare Other | Admitting: Surgery

## 2019-06-17 ENCOUNTER — Other Ambulatory Visit: Payer: Self-pay

## 2019-06-17 ENCOUNTER — Ambulatory Visit (INDEPENDENT_AMBULATORY_CARE_PROVIDER_SITE_OTHER): Payer: Medicare Other | Admitting: Specialist

## 2019-06-17 ENCOUNTER — Encounter: Payer: Self-pay | Admitting: Specialist

## 2019-06-17 VITALS — BP 160/86 | HR 109 | Ht 63.0 in | Wt 145.0 lb

## 2019-06-17 DIAGNOSIS — M4724 Other spondylosis with radiculopathy, thoracic region: Secondary | ICD-10-CM

## 2019-06-17 DIAGNOSIS — M5134 Other intervertebral disc degeneration, thoracic region: Secondary | ICD-10-CM

## 2019-06-17 DIAGNOSIS — M533 Sacrococcygeal disorders, not elsewhere classified: Secondary | ICD-10-CM

## 2019-06-17 DIAGNOSIS — K6289 Other specified diseases of anus and rectum: Secondary | ICD-10-CM

## 2019-06-17 DIAGNOSIS — M4325 Fusion of spine, thoracolumbar region: Secondary | ICD-10-CM

## 2019-06-17 DIAGNOSIS — G8929 Other chronic pain: Secondary | ICD-10-CM

## 2019-06-19 ENCOUNTER — Other Ambulatory Visit: Payer: Self-pay | Admitting: Specialist

## 2019-06-19 DIAGNOSIS — IMO0002 Reserved for concepts with insufficient information to code with codable children: Secondary | ICD-10-CM

## 2019-06-19 DIAGNOSIS — M75102 Unspecified rotator cuff tear or rupture of left shoulder, not specified as traumatic: Secondary | ICD-10-CM

## 2019-06-19 DIAGNOSIS — M4316 Spondylolisthesis, lumbar region: Secondary | ICD-10-CM

## 2019-06-20 NOTE — Progress Notes (Deleted)
Subjective:    Patient ID: Jill Phillips, female    DOB: 1956/04/08, 63 y.o.   MRN: [id number]  HPI The patient is here for follow up.  She is not exercising regularly.    Diabetes: She is taking her medication daily as prescribed. She is compliant with a diabetic diet.  She monitors her sugars and they have been running 178.    Hypertension: She is taking her medication daily. She is compliant with a low sodium diet.  She denies chest pain, palpitations, edema, shortness of breath and regular headaches.     Hyperlipidemia: She is taking her medication daily. She is compliant with a low fat/cholesterol diet. She denies myalgias.   Hypothyroidism:  She is taking her medication daily.  She denies any recent changes in energy or weight that are unexplained.   GERD:  She is taking her medication daily as prescribed.  She denies any GERD symptoms and feels her GERD is well controlled.   Depression: She is taking her medication daily as prescribed. She denies any side effects from the medication. She feels her depression is well controlled and she is happy with her current dose of medication.   RLS, anxiety:  She takes clonazepam twice daily.    Insomnia:  She is not sleeping with her medication   She is trying to find a pain clinic.  Dr [name] has been prescribing her medications.     Medications and allergies reviewed with patient and updated if appropriate.  Patient Active Problem List   Diagnosis Date Noted  . Anxiety 05/04/2019  . Insomnia 05/04/2019  . Headache 12/08/2018  . Syncope 12/08/2018  . COPD with exacerbation ([place]) 11/26/2018  . Acute bronchitis 11/03/2018  . Disorder of rotator cuff syndrome of left shoulder and allied disorder 06/22/2018  . Leg edema, left 06/04/2018  . Type 2 diabetes mellitus without complication, without long-term current use of insulin ([place]) 12/02/2017  . Sacral pain 09/17/2017  . COPD (chronic obstructive pulmonary disease) ([place])  04/21/2017  . Lower back pain 03/03/2017  . CKD (chronic kidney disease) stage 3, GFR 30-59 ml/min 08/07/2016  . GERD (gastroesophageal reflux disease) 08/07/2016  . Chronic respiratory failure ([place]) 07/29/2016  . Arthritis of carpometacarpal [facility]) joint of left thumb 07/09/2016  . Pneumothorax on left 01/31/2016  . Chronic, continuous use of opioids 09/06/2015  . Degenerative disc disease, lumbar 08/01/2015    Class: Chronic  . Spondylolisthesis of lumbar region 08/01/2015    Class: Chronic  . Urinary, incontinence, stress female 05/06/2014  . Constipation 05/05/2014  . 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 HTN (hypertension) 05/19/2013  . HLD (hyperlipidemia) 05/19/2013  . Depression 05/19/2013  . Hypothyroidism 05/19/2013  . Tobacco abuse   . Osteoarthritis of both knees 11/18/2011  . RLS (restless legs syndrome) 11/18/2011    Current Outpatient Medications on File Prior to Visit  Medication Sig Dispense Refill  . albuterol (PROAIR HFA) 108 (90 Base) MCG/ACT inhaler INHALE 1 PUFF INTO THE LUNGS EVERY 6 HOURS AS NEEDED FOR WHEEZING OR SHORTNESS OF BREATH.NEED OFFICE VISIT FOR FURTHER REFILLS 8.5 g 0  . amLODipine (NORVASC) 10 MG tablet TAKE 1 TABLET BY MOUTH DAILY. 90 tablet 1  . aspirin-acetaminophen-caffeine (EXCEDRIN MIGRAINE) 250-250-65 MG tablet Take 1 tablet by mouth every 6 (six) hours as needed for headache.    [name] atorvastatin (LIPITOR) 20 MG tablet TAKE 1 TABLET BY MOUTH DAILY. 90 tablet 0  . benazepril (LOTENSIN) 40 MG tablet TAKE 1 TABLET BY MOUTH  DAILY. 90 tablet 0  . Blood Glucose Monitoring Suppl (ONETOUCH VERIO FLEX SYSTEM) w/Device KIT USE TO TEST BLOOD SUGAR UP TO 4 TIMES A DAY 1 kit 0  . clonazePAM (KLONOPIN) 1 MG tablet TAKE 1 TABLET BY MOUTH 2 TIMES DAILY AS NEEDED FOR ANXIETY 60 tablet 0  . diphenhydrAMINE (BENADRYL) 25 MG tablet Take 25-50 mg by mouth every 6 (six) hours as needed (bladder).    . fentaNYL (DURAGESIC) 50 MCG/HR PLACE 1 PATCH ONTO THE SKIN EVERY 3 DAYS.  10 patch 0  . HYDROcodone-acetaminophen (NORCO) 10-325 MG tablet TAKE 1 TABLET BY MOUTH EVERY 6 HOURS AS NEEDED. 120 tablet 0  . levothyroxine (SYNTHROID) 88 MCG tablet TAKE 1 TABLET BY MOUTH DAILY. 90 tablet 1  . Meth-Hyo-M Bl-Na Phos-Ph Sal (URIBEL) 118 MG CAPS Take by mouth 3 (three) times daily.     [name] omeprazole (PRILOSEC) 40 MG capsule TAKE 1 CAPSULE BY MOUTH 2 TIMES DAILY BEFORE A MEAL. 180 capsule 3  . PROAIR HFA 108 (90 Base) MCG/ACT inhaler INHALE 2 PUFFS INTO THE LUNGS EVERY 6 HOURS AS NEEDED FOR WHEEZING OR SHORTNESS OF BREATH. 8 g 0  . QUEtiapine (SEROQUEL) 100 MG tablet TAKE 1 TABLET BY MOUTH AT BEDTIME. 30 tablet 1  . rOPINIRole (REQUIP) 0.5 MG tablet TAKE 2 TABLETS BY MOUTH AT BEDTIME. 60 tablet 2  . Sennosides (SENNA LAX PO) Take 1 tablet by mouth daily. As directed    . SPIRIVA HANDIHALER 18 MCG inhalation capsule PLACE 1 CAPSULE INTO INHALER AND INHALE ONCE DAILY. 30 capsule 0  . tiZANidine (ZANAFLEX) 4 MG capsule Take 1 capsule (4 mg total) by mouth 3 (three) times daily. 90 capsule 6  . valACYclovir (VALTREX) 500 MG tablet TAKE 1 TABLET BY MOUTH DAILY. 60 tablet 0  . venlafaxine XR (EFFEXOR-XR) 150 MG 24 hr capsule TAKE 2 CAPSULES BY MOUTH DAILY. 180 capsule 0  . Vitamin D, Ergocalciferol, (DRISDOL) 50000 units CAPS capsule Take 1 capsule (50,000 Units total) by mouth every 7 (seven) days. 8 capsule 0   Current Facility-Administered Medications on File Prior to Visit  Medication Dose Route Frequency Provider Last Rate Last Dose  . calcitonin (salmon) (MIACALCIN/FORTICAL) nasal spray 1 spray  1 spray Alternating Nares Daily [name], MD        Past Medical History:  Diagnosis Date  . Active smoker   . Anxiety   . Calcifying tendinitis of shoulder   . Chronic back pain   . Chronic pain syndrome   . COPD (chronic obstructive pulmonary disease) ([place])   . Dysthymic disorder   . Emphysema lung ([place])   . GERD (gastroesophageal reflux disease)   . Headache    "weekly  maybe" (01/31/2016)  . Heart murmur    for years, nothing to be concerned about  . Herpes genitalia   . History of blood transfusion 1980   related to "back surgery"  . Hyperlipidemia   . Hypertension   . Hypothyroidism   . Lumbago   . Osteoarthrosis, unspecified whether generalized or localized, lower leg   . Pain in joint, upper arm   . Pneumothorax, left 01/31/2016   S/P Left posterior subcostal pain injection on 01/30/2016  . PONV (postoperative nausea and vomiting)    gets nauseous  with longer surgery. Difficuty voiding after surgery  . Postlaminectomy syndrome, thoracic region   . Primary localized osteoarthrosis, lower leg   . Restless leg syndrome   . Sleep apnea    s/p surgery- last  sleep study 2011- doesnt use oxygen or machine at night as instructed,.   12/2014- Dr Halford Chessman  reports it is negative.  . Thyroid disease     Past Surgical History:  Procedure Laterality Date  . APPENDECTOMY    . BACK SURGERY     18 back surgeries (2 thoracic & 16 lumbar) (01/31/2016)  . DILATION AND CURETTAGE OF UTERUS    . HAMMER TOE SURGERY    . IR RADIOLOGIST EVAL & MGMT  07/21/2017  . JOINT REPLACEMENT    . KNEE ARTHROSCOPY Right   . LAPAROSCOPIC CHOLECYSTECTOMY    . LUMBAR FUSION N/A 08/01/2015   Procedure: Right sided L1-2 and L2-3 transforaminal lumbar interbody fusion with cages, Extension of posterior fusion T12 to L3, Replaced pedicle screws bilaterally L1-L2 , Replaced left sided pedicle screws L-3. Instrumentation T12 to L3 using local bone graft, Vivigen allograft and cancellous chips;  Surgeon: [name], MD;  Location: [facility];  Service: Orthopedics;  Laterality: N/A;  . LUMBAR LAMINECTOMY/DECOMPRESSION MICRODISCECTOMY N/A 01/28/2014   Procedure: Minimally Invasive Right  L1-2 Microdiscectomy;  Surgeon: [name], MD;  Location: [facility];  Service: Orthopedics;  Laterality: N/A;  . TOTAL HIP ARTHROPLASTY Right   . TOTAL KNEE ARTHROPLASTY  05/29/2012   Procedure: TOTAL KNEE  ARTHROPLASTY;  Surgeon: Mcarthur Rossetti, MD;  Location: [facility];  Service: Orthopedics;  Laterality: Right;  Right Total Knee Arthroplasty  . TUBAL LIGATION    . UVULOPALATOPHARYNGOPLASTY    . VAGINAL HYSTERECTOMY      Social History   Socioeconomic History  . Marital status: Divorced    Spouse name: n/a  . Number of children: 2  . Years of education: 12+  . Highest education level: Not on file  Occupational History  . Occupation: disability    Comment: back surgeries  Social Needs  . Financial resource strain: Not on file  . Food insecurity    Worry: Not on file    Inability: Not on file  . Transportation needs    Medical: Not on file    Non-medical: Not on file  Tobacco Use  . Smoking status: Current Every Day Smoker    Packs/day: 1.25    Years: 45.00    Pack years: 56.25    Types: Cigarettes  . Smokeless tobacco: Never Used  . Tobacco comment: form given 12/25/16  Substance and Sexual Activity  . Alcohol use: No    Alcohol/week: 0.0 standard drinks  . Drug use: No    Types: Marijuana    Comment: 01/31/2016 "none since ~ 1980"  . Sexual activity: Never    Partners: Male  Lifestyle  . Physical activity    Days per week: Not on file    Minutes per session: Not on file  . Stress: Not on file  Relationships  . Social Herbalist on phone: Not on file    Gets together: Not on file    Attends religious service: Not on file    Active member of club or organization: Not on file    Attends meetings of clubs or organizations: Not on file    Relationship status: Not on file  Other Topics Concern  . Not on file  Social History Narrative   Lives alone.  One daughter is local, but is getting ready to move to [place], where her children live with their father.  The other daughter lives near [place], [place].    Family History  Problem Relation Age  of Onset  . Kidney disease Mother   . Heart disease Father   . Anuerysm Brother 29       brain  . Heart  disease Brother   . Heart disease Sister 61       s/p CABG  . Hypertension Sister   . Colon cancer Neg Hx     Review of Systems  Constitutional: Negative for fever.  Cardiovascular: Negative for chest pain, palpitations and leg swelling.  Musculoskeletal: Positive for arthralgias and back pain.  Neurological: Positive for headaches. Negative for light-headedness and numbness.       Objective:  There were no vitals filed for this visit. BP Readings from Last 3 Encounters:  06/17/19 (!) 160/86  05/26/19 140/88  04/26/19 (!) 159/95   Wt Readings from Last 3 Encounters:  06/17/19 145 lb (65.8 kg)  05/26/19 145 lb (65.8 kg)  04/26/19 145 lb (65.8 kg)   There is no height or weight on file to calculate BMI.   Physical Exam    Constitutional: Appears well-developed and well-nourished. No distress.  HENT:  Head: Normocephalic and atraumatic.  Neck: Neck supple. No tracheal deviation present. No thyromegaly present.  No cervical lymphadenopathy Cardiovascular: Normal rate, regular rhythm and normal heart sounds.   No murmur heard. No carotid bruit .  No edema Pulmonary/Chest: Effort normal and breath sounds normal. No respiratory distress. No has no wheezes. No rales.  Skin: Skin is warm and dry. Not diaphoretic.  Psychiatric: Normal mood and affect. Behavior is normal.      Assessment & Plan:    See Problem List for Assessment and Plan of chronic medical problems.

## 2019-06-21 ENCOUNTER — Telehealth: Payer: Self-pay | Admitting: Specialist

## 2019-06-21 ENCOUNTER — Other Ambulatory Visit: Payer: Self-pay | Admitting: Specialist

## 2019-06-21 ENCOUNTER — Other Ambulatory Visit: Payer: Self-pay

## 2019-06-21 ENCOUNTER — Encounter: Payer: Self-pay | Admitting: Internal Medicine

## 2019-06-21 ENCOUNTER — Ambulatory Visit (INDEPENDENT_AMBULATORY_CARE_PROVIDER_SITE_OTHER): Payer: Medicare Other | Admitting: Internal Medicine

## 2019-06-21 DIAGNOSIS — G2581 Restless legs syndrome: Secondary | ICD-10-CM

## 2019-06-21 DIAGNOSIS — I10 Essential (primary) hypertension: Secondary | ICD-10-CM | POA: Diagnosis not present

## 2019-06-21 DIAGNOSIS — E119 Type 2 diabetes mellitus without complications: Secondary | ICD-10-CM

## 2019-06-21 DIAGNOSIS — F32A Depression, unspecified: Secondary | ICD-10-CM

## 2019-06-21 DIAGNOSIS — F329 Major depressive disorder, single episode, unspecified: Secondary | ICD-10-CM | POA: Diagnosis not present

## 2019-06-21 DIAGNOSIS — E038 Other specified hypothyroidism: Secondary | ICD-10-CM | POA: Diagnosis not present

## 2019-06-21 DIAGNOSIS — G4700 Insomnia, unspecified: Secondary | ICD-10-CM

## 2019-06-21 DIAGNOSIS — M4316 Spondylolisthesis, lumbar region: Secondary | ICD-10-CM

## 2019-06-21 DIAGNOSIS — E7849 Other hyperlipidemia: Secondary | ICD-10-CM

## 2019-06-21 DIAGNOSIS — K219 Gastro-esophageal reflux disease without esophagitis: Secondary | ICD-10-CM

## 2019-06-21 DIAGNOSIS — N1831 Chronic kidney disease, stage 3a: Secondary | ICD-10-CM

## 2019-06-21 DIAGNOSIS — F419 Anxiety disorder, unspecified: Secondary | ICD-10-CM

## 2019-06-21 MED ORDER — GABAPENTIN 400 MG PO CAPS: 400.0000 mg | ORAL_CAPSULE | Freq: Three times a day (TID) | ORAL | 1 refills | Status: DC

## 2019-06-21 NOTE — Assessment & Plan Note (Signed)
Controlled, stable Continue current dose of medication - clonazepam bid

## 2019-06-21 NOTE — Assessment & Plan Note (Signed)
Lab Results  Component Value Date   HGBA1C 6.3 11/03/2018   Diet controlled Will check a1c at her next visit

## 2019-06-21 NOTE — Assessment & Plan Note (Signed)
Improved after stopping some medications - hctz, diclofenac Continue increased fluids cmp at next visit

## 2019-06-21 NOTE — Progress Notes (Signed)
Virtual Visit via Video Note  I connected with Jill Phillips on 06/21/19 at  1:45 PM EDT by a video enabled telemedicine application and verified that I am speaking with the correct person using two identifiers.   I discussed the limitations of evaluation and management by telemedicine and the availability of in person appointments. The patient expressed understanding and agreed to proceed.  The patient is currently at home and I am in the office.    No referring provider.    History of Present Illness: The patient is here for follow up.  She is not exercising regularly.    Diabetes: She is taking her medication daily as prescribed. She is compliant with a diabetic diet.  She monitors her sugars and they have been running 178.    Hypertension: She is taking her medication daily. She is compliant with a low sodium diet.  She denies chest pain, palpitations, edema, shortness of breath.     Hyperlipidemia: She is taking her medication daily. She is compliant with a low fat/cholesterol diet. She denies myalgias.   Hypothyroidism:  She is taking her medication daily.  She denies any recent changes in energy or weight that are unexplained.   GERD:  She is taking her medication daily as prescribed.  She denies any GERD symptoms and feels her GERD is well controlled.   Depression: She is taking her medication daily as prescribed. She denies any side effects from the medication. She feels her depression is well controlled and she is happy with her current dose of medication.   RLS, anxiety:  She takes clonazepam twice daily.  This is controlling her symptoms.   Insomnia:  She is not sleeping with her medication and wonders if the dose of her medication can be increased.    She is trying to find a pain clinic.  Dr [name] has been prescribing her medications, but she does not want to have the surgery he proposed and is concerned he will not prescribe her the pain medication.     Review of  Systems  Constitutional: Negative for fever.  Cardiovascular: Negative for chest pain, palpitations and leg swelling.  Musculoskeletal: Positive for arthralgias and back pain.  Neurological: Positive for headaches. Negative for light-headedness and numbness.    Social History   Socioeconomic History  . Marital status: Divorced    Spouse name: n/a  . Number of children: 2  . Years of education: 12+  . Highest education level: Not on file  Occupational History  . Occupation: disability    Comment: back surgeries  Social Needs  . Financial resource strain: Not on file  . Food insecurity    Worry: Not on file    Inability: Not on file  . Transportation needs    Medical: Not on file    Non-medical: Not on file  Tobacco Use  . Smoking status: Current Every Day Smoker    Packs/day: 1.25    Years: 45.00    Pack years: 56.25    Types: Cigarettes  . Smokeless tobacco: Never Used  . Tobacco comment: form given 12/25/16  Substance and Sexual Activity  . Alcohol use: No    Alcohol/week: 0.0 standard drinks  . Drug use: No    Types: Marijuana    Comment: 01/31/2016 "none since ~ 1980"  . Sexual activity: Never    Partners: Male  Lifestyle  . Physical activity    Days per week: Not on file    Minutes per session:  Not on file  . Stress: Not on file  Relationships  . Social Herbalist on phone: Not on file    Gets together: Not on file    Attends religious service: Not on file    Active member of club or organization: Not on file    Attends meetings of clubs or organizations: Not on file    Relationship status: Not on file  Other Topics Concern  . Not on file  Social History Narrative   Lives alone.  One daughter is local, but is getting ready to move to [place], where her children live with their father.  The other daughter lives near [place], [place].     Observations/Objective: Appears well in NAD   Assessment and Plan:  See Problem List for Assessment and  Plan of chronic medical problems.   Follow Up Instructions:    I discussed the assessment and treatment plan with the patient. The patient was provided an opportunity to ask questions and all were answered. The patient agreed with the plan and demonstrated an understanding of the instructions.   The patient was advised to call back or seek an in-person evaluation if the symptoms worsen or if the condition fails to improve as anticipated.    [name], MD

## 2019-06-21 NOTE — Telephone Encounter (Signed)
I have sent request to Dr. [name] for refills.  Patient came in on Thursday to be seen by Dr. [name], appt was to discuss her questions about surgery.  She wanted to discuss this, along with her shoulder, a knee, and both of her thumbs.  I advised that due to the time given for her appt that we could only see her for 2 problems.  And I finished checking her in, then she came back and said asked to speak with me, I went back into the room and states that she felt like she was wasting Dr. Otho Ket time, and that she did not like the way I was talking to her, and since she could not discuss all her problems that she should leave.  I tried to get her to stay and at least discuss her concerns regarding her surgery and at least one other problem.  She refused and left the office without being seen. She also stated that she was upset with the way her previous appt ended as far as Dr. [name] discussing her pain meds.  She was also upset with being denied for Pain Management at [facility] by Dr. Holley Raring and not knowing why, I advised the referral note did not say a reason why. When she left on Thursday her she stated that she was not going to have the surgery done. I advised Sherrie that patient wanted to cancel it at this time. Dr. [name] came out of the room in front of her as she was leaving, I advised Dr. [name] of what had happened and he agreed that we can not she her for all her issues that day. And he sad it was that she left.  --Can you call her?

## 2019-06-21 NOTE — Assessment & Plan Note (Signed)
Controlled, stable Continue current dose of medication  

## 2019-06-21 NOTE — Telephone Encounter (Signed)
Patient called. Was returning a call. Her call back number is [phone]

## 2019-06-21 NOTE — Assessment & Plan Note (Signed)
Pain medications per ortho - looking for a new pain management doctor Will increase gabapentin to 400 mg TID

## 2019-06-21 NOTE — Assessment & Plan Note (Signed)
Continue daily statin Regular exercise and healthy diet encouraged  

## 2019-06-21 NOTE — Assessment & Plan Note (Signed)
Clinically euthyroid Continue current dose of medication  

## 2019-06-21 NOTE — Telephone Encounter (Signed)
I tried calling patient, no answer. LMVM advising was returning her call to discuss.  LM for her to call me back to further discuss.

## 2019-06-21 NOTE — Assessment & Plan Note (Signed)
GERD controlled Continue daily medication  

## 2019-06-21 NOTE — Telephone Encounter (Signed)
Patient called requesting an RX refill on her Hydrocodone and Fentanyl patches.  She also stated that she was upset that she did not get to talk to Dr. [name] about her surgery and the pain medication.  She also wanted to know if she really needs to come to her appointment on Wednesday, because she is scared about being under for 8 hours for this surgery.  FC#[phone].  Thank you.

## 2019-06-21 NOTE — Assessment & Plan Note (Signed)
She does not sleep well and wants seroquel increased, but I will not do that - she is on multiple medications and is at high risk of polypharmacy No change in seroquel dose

## 2019-06-21 NOTE — Assessment & Plan Note (Signed)
Continue current medication BP has been good at other doctor offices

## 2019-06-22 ENCOUNTER — Telehealth: Payer: Self-pay

## 2019-06-22 ENCOUNTER — Telehealth: Payer: Self-pay | Admitting: Specialist

## 2019-06-22 MED ORDER — FENTANYL 50 MCG/HR TD PT72: MEDICATED_PATCH | TRANSDERMAL | 0 refills | Status: DC

## 2019-06-22 NOTE — Telephone Encounter (Signed)
Chronic pain management, she is scheduled for extension of her fusion.

## 2019-06-22 NOTE — Telephone Encounter (Signed)
Please advise. Thanks.  

## 2019-06-22 NOTE — Telephone Encounter (Signed)
Patient called again in reference to her medication refill from the other day.  CH#[phone].  Thank you.

## 2019-06-22 NOTE — Telephone Encounter (Signed)
I called patient to confirm that we have canceled back surgery per her request.  She said her shoulder and knee hurt her worse.  She would like to get surgery on her shoulder.  Is Dr. Marlou Sa willing to do this for her at this point?  Please call her to advise.  [phone]

## 2019-06-22 NOTE — Telephone Encounter (Signed)
Message sent in error

## 2019-06-22 NOTE — Telephone Encounter (Signed)
How is the time line on this patient's surgery scheduling? One more patient on strong narcotics.

## 2019-06-23 ENCOUNTER — Ambulatory Visit: Payer: Medicare Other | Admitting: Surgery

## 2019-06-23 ENCOUNTER — Other Ambulatory Visit: Payer: Self-pay | Admitting: Internal Medicine

## 2019-06-23 ENCOUNTER — Telehealth: Payer: Self-pay | Admitting: Internal Medicine

## 2019-06-23 DIAGNOSIS — G8929 Other chronic pain: Secondary | ICD-10-CM

## 2019-06-23 DIAGNOSIS — M549 Dorsalgia, unspecified: Secondary | ICD-10-CM

## 2019-06-23 DIAGNOSIS — M2550 Pain in unspecified joint: Secondary | ICD-10-CM

## 2019-06-23 NOTE — Telephone Encounter (Signed)
Needs to come in for eval.  It is possible.

## 2019-06-23 NOTE — Telephone Encounter (Signed)
She has an appt on 11/2 to discuss surgery with Dr Marlou Sa.

## 2019-06-23 NOTE — Telephone Encounter (Signed)
This has been sent and she is aware

## 2019-06-23 NOTE — Telephone Encounter (Signed)
I called her and advised that we have cancelled her surgery for her back, and I was going to schedule her to come back and see Dr. [name] to discuss her questions, but she states that her shoulder is bothering her the most, so I scheduled her with Dr. Marlou Sa for her shoulder.  I advised that Dr. [name] approved her meds also.

## 2019-06-23 NOTE — Telephone Encounter (Signed)
Pt says that she had a visit with PCP and was told to get back to her to give the name of a pain management provider that she would like to be referred to. Pt would like to see Dr. Brandy Hale at [address] Phone: [phone] or Dr. Dionne Milo

## 2019-06-23 NOTE — Telephone Encounter (Signed)
Referral ordered.  She has seen a few different pain management doctors over the past couple of years.

## 2019-06-23 NOTE — Telephone Encounter (Signed)
See other message I called her back

## 2019-06-23 NOTE — Telephone Encounter (Signed)
She has decided not to have the back surgery.  She states her shoulder and knee hurt worse.  I have sent a message to Dr. Marlou Sa about possible shoulder intervention.

## 2019-06-24 NOTE — Telephone Encounter (Signed)
Check [place] registry last filled 05/28/2019.Marland KitchenJohny Phillips

## 2019-06-29 ENCOUNTER — Other Ambulatory Visit: Payer: Self-pay | Admitting: Specialist

## 2019-06-29 ENCOUNTER — Inpatient Hospital Stay: Admit: 2019-06-29 | Payer: Medicare Other | Admitting: Specialist

## 2019-06-29 SURGERY — FUSION, SPINE, 2 OR MORE LEVELS, POSTERIOR APPROACH
Anesthesia: General

## 2019-07-02 ENCOUNTER — Other Ambulatory Visit: Payer: Self-pay | Admitting: Family

## 2019-07-02 ENCOUNTER — Other Ambulatory Visit: Payer: Self-pay | Admitting: Internal Medicine

## 2019-07-02 DIAGNOSIS — I10 Essential (primary) hypertension: Secondary | ICD-10-CM

## 2019-07-05 ENCOUNTER — Other Ambulatory Visit: Payer: Self-pay

## 2019-07-05 ENCOUNTER — Encounter: Payer: Self-pay | Admitting: Orthopedic Surgery

## 2019-07-05 ENCOUNTER — Ambulatory Visit (INDEPENDENT_AMBULATORY_CARE_PROVIDER_SITE_OTHER): Payer: Medicare Other | Admitting: Orthopedic Surgery

## 2019-07-05 VITALS — Ht 64.0 in | Wt 147.0 lb

## 2019-07-05 DIAGNOSIS — M12812 Other specific arthropathies, not elsewhere classified, left shoulder: Secondary | ICD-10-CM | POA: Diagnosis not present

## 2019-07-06 ENCOUNTER — Telehealth: Payer: Self-pay | Admitting: Orthopedic Surgery

## 2019-07-06 NOTE — Telephone Encounter (Signed)
I called and spoke with patient and she states that years ago before she had surgery on her right knee Dr. [name] had someone to measure her for a brace that she would wear all the time, she states that she would like to try to get one for her left knee.  I advised that she would need to be seen for that knee so we could submit that OV note to the insurance company to get them to cover it. She has not been seen for her left knee since 05/2017.  I made her an appt with james to see if this an option for her.

## 2019-07-06 NOTE — Telephone Encounter (Signed)
Patient called wanting to speak with you about her left knee.  [phone]  Patient of Dr. [name]

## 2019-07-07 ENCOUNTER — Other Ambulatory Visit: Payer: Self-pay | Admitting: Internal Medicine

## 2019-07-07 ENCOUNTER — Other Ambulatory Visit: Payer: Self-pay | Admitting: Adult Health

## 2019-07-08 ENCOUNTER — Encounter: Payer: Self-pay | Admitting: Surgery

## 2019-07-08 ENCOUNTER — Ambulatory Visit: Payer: Self-pay

## 2019-07-08 ENCOUNTER — Ambulatory Visit (INDEPENDENT_AMBULATORY_CARE_PROVIDER_SITE_OTHER): Payer: Medicare Other | Admitting: Surgery

## 2019-07-08 ENCOUNTER — Other Ambulatory Visit: Payer: Self-pay

## 2019-07-08 DIAGNOSIS — M25562 Pain in left knee: Secondary | ICD-10-CM | POA: Diagnosis not present

## 2019-07-08 DIAGNOSIS — M1712 Unilateral primary osteoarthritis, left knee: Secondary | ICD-10-CM

## 2019-07-08 DIAGNOSIS — G8929 Other chronic pain: Secondary | ICD-10-CM

## 2019-07-08 MED ORDER — METHYLPREDNISOLONE ACETATE 40 MG/ML IJ SUSP
40.0000 mg | INTRAMUSCULAR | Status: AC | PRN
  Administered 2019-07-08: 15:00:00 40 mg via INTRA_ARTICULAR

## 2019-07-08 MED ORDER — LIDOCAINE HCL 1 % IJ SOLN
3.0000 mL | INTRAMUSCULAR | Status: AC | PRN
  Administered 2019-07-08: 3 mL

## 2019-07-08 MED ORDER — BUPIVACAINE HCL 0.25 % IJ SOLN
6.0000 mL | INTRAMUSCULAR | Status: AC | PRN
  Administered 2019-07-08: 15:00:00 6 mL via INTRA_ARTICULAR

## 2019-07-08 NOTE — Progress Notes (Signed)
Office Visit Note   Patient: Jill Phillips           Date of Birth: [birth year]           MRN: [id number] Visit Date: 07/08/2019              Requested by: [name], MD [place],  [place] [address] PCP: [name], MD   Assessment & Plan: Visit Diagnoses:  1. Chronic pain of left knee   2. Arthritis of left knee     Plan: In hopes of giving patient improvement of her left knee pain I did offer to repeat injection.  Patient consent left knee was prepped with Betadine and intra-articular Marcaine/Depo-Medrol injection performed.  Tolerated procedure well without complication.  I will have her follow-up in the office as needed.  Again advised patient that ultimately will come down her needing definitive treatment with total knee replacement.  She can let us know if she is wanting to go that route.  States that she is scheduled to have left shoulder surgery with Dr. Marlou Sa.  Follow-Up Instructions: Return if symptoms worsen or fail to improve.   Orders:  Orders Placed This Encounter  Procedures   XR KNEE 3 VIEW LEFT   No orders of the defined types were placed in this encounter.     Procedures: Large Joint Inj: L knee on 07/08/2019 3:13 PM Indications: pain Details: 25 G 1.5 in needle, anteromedial approach Medications: 3 mL lidocaine 1 %; 6 mL bupivacaine 0.25 %; 40 mg methylPREDNISolone acetate 40 MG/ML Outcome: tolerated well, no immediate complications Consent was given by the patient. Patient was prepped and draped in the usual sterile fashion.       Clinical Data: No additional findings.   Subjective: Chief Complaint  Patient presents with   Left Knee - Pain    HPI 63 year old white female history of left knee DJD comes in with complaints of left knee pain.  Patient was seen in the office for left knee February 2020 by Dr. [name] and intra-articular Marcaine/Depo-Medrol injection performed.  This gave temporary improvement.  Patient has been  advised that ultimately will come down to needing definitive treatment with total knee replacement. Review of Systems No current cardiac pulmonary GI GU issues  Objective: Vital Signs: There were no vitals taken for this visit.  Physical Exam HENT:     Head: Normocephalic and atraumatic.  Eyes:     Pupils: Pupils are equal, round, and reactive to light.  Pulmonary:     Effort: No respiratory distress.  Musculoskeletal:     Comments: Gait is antalgic.  Left knee good range of motion.  Mild swelling without large effusion.  Joint line tender.  Positive crepitus.  Calf nontender.  Neurovascular intact.  Skin:    General: Skin is warm and dry.  Neurological:     General: No focal deficit present.     Mental Status: She is alert and oriented to person, place, and time.  Psychiatric:        Mood and Affect: Mood normal.     Ortho Exam  Specialty Comments:  No specialty comments available.  Imaging: No results found.   PMFS History: Patient Active Problem List   Diagnosis Date Noted   Anxiety 05/04/2019   Insomnia 05/04/2019   Headache 12/08/2018   Syncope 12/08/2018   COPD with exacerbation ([place]) 11/26/2018   Disorder of rotator cuff syndrome of left shoulder and allied disorder 06/22/2018  Leg edema, left 06/04/2018   Type 2 diabetes mellitus without complication, without long-term current use of insulin ([place]) 12/02/2017   Sacral pain 09/17/2017   COPD (chronic obstructive pulmonary disease) ([place]) 04/21/2017   Lower back pain 03/03/2017   CKD (chronic kidney disease) stage 3, GFR 30-59 ml/min 08/07/2016   GERD (gastroesophageal reflux disease) 08/07/2016   Chronic respiratory failure ([place]) 07/29/2016   Arthritis of carpometacarpal (CMC) joint of left thumb 07/09/2016   Pneumothorax on left 01/31/2016   Chronic, continuous use of opioids 09/06/2015   Degenerative disc disease, lumbar 08/01/2015    Class: Chronic   Spondylolisthesis of lumbar  region 08/01/2015    Class: Chronic   Urinary, incontinence, stress female 05/06/2014   Constipation 05/05/2014   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/14/2013   HTN (hypertension) 05/19/2013   HLD (hyperlipidemia) 05/19/2013   Depression 05/19/2013   Hypothyroidism 05/19/2013   Tobacco abuse    Osteoarthritis of both knees 11/18/2011   RLS (restless legs syndrome) 11/18/2011   Past Medical History:  Diagnosis Date   Active smoker    Anxiety    Calcifying tendinitis of shoulder    Chronic back pain    Chronic pain syndrome    COPD (chronic obstructive pulmonary disease) ([place])    Dysthymic disorder    Emphysema lung ([facility])    GERD (gastroesophageal reflux disease)    Headache    "weekly maybe" (01/31/2016)   Heart murmur    for years, nothing to be concerned about   Herpes genitalia    History of blood transfusion 1980   related to "back surgery"   Hyperlipidemia    Hypertension    Hypothyroidism    Lumbago    Osteoarthrosis, unspecified whether generalized or localized, lower leg    Pain in joint, upper arm    Pneumothorax, left 01/31/2016   S/P Left posterior subcostal pain injection on 01/30/2016   PONV (postoperative nausea and vomiting)    gets nauseous  with longer surgery. Difficuty voiding after surgery   Postlaminectomy syndrome, thoracic region    Primary localized osteoarthrosis, lower leg    Restless leg syndrome    Sleep apnea    s/p surgery- last sleep study 2011- doesnt use oxygen or machine at night as instructed,.   12/2014- Dr Halford Chessman  reports it is negative.   Thyroid disease     Family History  Problem Relation Age of Onset   Kidney disease Mother    Heart disease Father    Anuerysm Brother 28       brain   Heart disease Brother    Heart disease Sister 52       s/p CABG   Hypertension Sister    Colon cancer Neg Hx     Past Surgical History:  Procedure Laterality Date   APPENDECTOMY     BACK SURGERY     18 back  surgeries (2 thoracic & 16 lumbar) (01/31/2016)   DILATION AND CURETTAGE OF UTERUS     HAMMER TOE SURGERY     IR RADIOLOGIST EVAL & MGMT  07/21/2017   JOINT REPLACEMENT     KNEE ARTHROSCOPY Right    LAPAROSCOPIC CHOLECYSTECTOMY     LUMBAR FUSION N/A 08/01/2015   Procedure: Right sided L1-2 and L2-3 transforaminal lumbar interbody fusion with cages, Extension of posterior fusion T12 to L3, Replaced pedicle screws bilaterally L1-L2 , Replaced left sided pedicle screws L-3. Instrumentation T12 to L3 using local bone graft, Vivigen allograft and cancellous chips;  Surgeon: Daleen Bo  [name], MD;  Location: [facility];  Service: Orthopedics;  Laterality: N/A;   LUMBAR LAMINECTOMY/DECOMPRESSION MICRODISCECTOMY N/A 01/28/2014   Procedure: Minimally Invasive Right  L1-2 Microdiscectomy;  Surgeon: [name], MD;  Location: [facility];  Service: Orthopedics;  Laterality: N/A;   TOTAL HIP ARTHROPLASTY Right    TOTAL KNEE ARTHROPLASTY  05/29/2012   Procedure: TOTAL KNEE ARTHROPLASTY;  Surgeon: Mcarthur Rossetti, MD;  Location: [facility];  Service: Orthopedics;  Laterality: Right;  Right Total Knee Arthroplasty   TUBAL LIGATION     UVULOPALATOPHARYNGOPLASTY     VAGINAL HYSTERECTOMY     Social History   Occupational History   Occupation: disability    Comment: back surgeries  Tobacco Use   Smoking status: Current Every Day Smoker    Packs/day: 1.25    Years: 45.00    Pack years: 56.25    Types: Cigarettes   Smokeless tobacco: Never Used   Tobacco comment: form given 12/25/16  Substance and Sexual Activity   Alcohol use: No    Alcohol/week: 0.0 standard drinks   Drug use: No    Types: Marijuana    Comment: 01/31/2016 "none since ~ 1980"   Sexual activity: Never    Partners: Male

## 2019-07-09 ENCOUNTER — Encounter: Payer: Self-pay | Admitting: Orthopedic Surgery

## 2019-07-09 NOTE — Progress Notes (Signed)
Office Visit Note   Patient: Jill Phillips           Date of Birth: November 21, 1955           MRN: [id number] Visit Date: 07/05/2019 Requested by: [name], MD [place],  [place] [address] PCP: [name], MD  Subjective: Chief Complaint  Patient presents with  . Left Shoulder - Pain    HPI: Jill Phillips is a 63 y.o. female who presents to the office complaining of left shoulder pain.  Patient was last seen 5 months ago for the same complaint.  She notes constant pain throughout the left shoulder with radiation down the left arm.  She denies any numbness or tingling or burning pain but notes weakness of the shoulder.  She originally hurt her left shoulder last fall when she was lifting a case of water bottles.  She has been taking Norco 10 mg every 6 hours for pain.  Pain rather than lack of function is her main complaint.  She has never had surgery on her left shoulder prior.  She has a history of smoking 1 pack/day, prediabetes, COPD, hypothyroid, but denies any history of blood clots personally or in her family..                ROS:  All systems reviewed are negative as they relate to the chief complaint within the history of present illness.  Patient denies fevers or chills.  Assessment & Plan: Visit Diagnoses:  1. Rotator cuff arthropathy of left shoulder     Plan: Patient is a 63 year old female who presents with history of chronic left shoulder pain since last fall when she injured her shoulder.  Patient had an MRI of the left shoulder on 07/28/2018 that showed a complete supraspinatus tendon tear with 3.5 to 4.5 cm of retraction as well as a partial tear of the superior subscapularis with 1 to 2 cm of retraction.  Impression is left shoulder rotator cuff arthropathy.  She has good functional range of motion of the left shoulder, with pain as her main complaint.  Discussed options available to patient including injections versus surgery.  Patient request surgery.   Discussed the risks and benefits of the procedure and all that entails.  After lengthy discussion, patient agreed with plan will proceed with left shoulder reverse shoulder arthroplasty.  Ordered thin cut CT scan of the left shoulder for preoperative planning.  Patient will follow-up after her CT scan.  She should have adequate bone stock in the glenoid vault.  I do not think she is a great candidate for any type of tendon transfer particularly with her subscapularis involvement.  She is on the young side for reverse shoulder replacement.  However for pain control I think that is likely her best option.  It is discussed with her that she will likely not get any better function with that reverse replacement opposed to what she has now.  The risk and benefits of shoulder replacement are discussed with her including but not limited to infection nerve vessel damage incomplete pain relief as well as persistent need for revision surgery.  Follow-Up Instructions: No follow-ups on file.   Orders:  Orders Placed This Encounter  Procedures  . CT SHOULDER LEFT WO CONTRAST   No orders of the defined types were placed in this encounter.     Procedures: No procedures performed   Clinical Data: No additional findings.  Objective: Vital Signs: Ht 5'  4" (1.626 m)   Wt 147 lb (66.7 kg)   BMI 25.23 kg/m   Physical Exam:  Constitutional: Patient appears well-developed HEENT:  Head: Normocephalic Eyes:EOM are normal Neck: Normal range of motion Cardiovascular: Normal rate Pulmonary/chest: Effort normal Neurologic: Patient is alert Skin: Skin is warm Psychiatric: Patient has normal mood and affect  Ortho Exam:  Left shoulder Exam Able to forward flex and abduct shoulder overhead with pain No TTP over the [facility] joint  TTP over the bicipital groove 5/5 motor strength of the infraspinatus 4/5 motor strength of the subscapularis and supraspinatus 5/5 grip strength, forearm pronation/supination, and  bicep strength  Specialty Comments:  No specialty comments available.  Imaging: No results found.   PMFS History: Patient Active Problem List   Diagnosis Date Noted  . Anxiety 05/04/2019  . Insomnia 05/04/2019  . Headache 12/08/2018  . Syncope 12/08/2018  . COPD with exacerbation ([place]) 11/26/2018  . Disorder of rotator cuff syndrome of left shoulder and allied disorder 06/22/2018  . Leg edema, left 06/04/2018  . Type 2 diabetes mellitus without complication, without long-term current use of insulin ([place]) 12/02/2017  . Sacral pain 09/17/2017  . COPD (chronic obstructive pulmonary disease) ([place]) 04/21/2017  . Lower back pain 03/03/2017  . CKD (chronic kidney disease) stage 3, GFR 30-59 ml/min 08/07/2016  . GERD (gastroesophageal reflux disease) 08/07/2016  . Chronic respiratory failure ([place]) 07/29/2016  . Arthritis of carpometacarpal [facility]) joint of left thumb 07/09/2016  . Pneumothorax on left 01/31/2016  . Chronic, continuous use of opioids 09/06/2015  . Degenerative disc disease, lumbar 08/01/2015    Class: Chronic  . Spondylolisthesis of lumbar region 08/01/2015    Class: Chronic  . Urinary, incontinence, stress female 05/06/2014  . Constipation 05/05/2014  . 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 HTN (hypertension) 05/19/2013  . HLD (hyperlipidemia) 05/19/2013  . Depression 05/19/2013  . Hypothyroidism 05/19/2013  . Tobacco abuse   . Osteoarthritis of both knees 11/18/2011  . RLS (restless legs syndrome) 11/18/2011   Past Medical History:  Diagnosis Date  . Active smoker   . Anxiety   . Calcifying tendinitis of shoulder   . Chronic back pain   . Chronic pain syndrome   . COPD (chronic obstructive pulmonary disease) ([place])   . Dysthymic disorder   . Emphysema lung ([place])   . GERD (gastroesophageal reflux disease)   . Headache    "weekly maybe" (01/31/2016)  . Heart murmur    for years, nothing to be concerned about  . Herpes genitalia   . History of blood transfusion  1980   related to "back surgery"  . Hyperlipidemia   . Hypertension   . Hypothyroidism   . Lumbago   . Osteoarthrosis, unspecified whether generalized or localized, lower leg   . Pain in joint, upper arm   . Pneumothorax, left 01/31/2016   S/P Left posterior subcostal pain injection on 01/30/2016  . PONV (postoperative nausea and vomiting)    gets nauseous  with longer surgery. Difficuty voiding after surgery  . Postlaminectomy syndrome, thoracic region   . Primary localized osteoarthrosis, lower leg   . Restless leg syndrome   . Sleep apnea    s/p surgery- last sleep study 2011- doesnt use oxygen or machine at night as instructed,.   12/2014- Dr Halford Chessman  reports it is negative.  . Thyroid disease     Family History  Problem Relation Age of Onset  . Kidney disease Mother   . Heart disease Father   .  Anuerysm Brother 29       brain  . Heart disease Brother   . Heart disease Sister 28       s/p CABG  . Hypertension Sister   . Colon cancer Neg Hx     Past Surgical History:  Procedure Laterality Date  . APPENDECTOMY    . BACK SURGERY     18 back surgeries (2 thoracic & 16 lumbar) (01/31/2016)  . DILATION AND CURETTAGE OF UTERUS    . HAMMER TOE SURGERY    . IR RADIOLOGIST EVAL & MGMT  07/21/2017  . JOINT REPLACEMENT    . KNEE ARTHROSCOPY Right   . LAPAROSCOPIC CHOLECYSTECTOMY    . LUMBAR FUSION N/A 08/01/2015   Procedure: Right sided L1-2 and L2-3 transforaminal lumbar interbody fusion with cages, Extension of posterior fusion T12 to L3, Replaced pedicle screws bilaterally L1-L2 , Replaced left sided pedicle screws L-3. Instrumentation T12 to L3 using local bone graft, Vivigen allograft and cancellous chips;  Surgeon: [name], MD;  Location: [facility];  Service: Orthopedics;  Laterality: N/A;  . LUMBAR LAMINECTOMY/DECOMPRESSION MICRODISCECTOMY N/A 01/28/2014   Procedure: Minimally Invasive Right  L1-2 Microdiscectomy;  Surgeon: [name], MD;  Location: [place] [facility] [place];  Service:  Orthopedics;  Laterality: N/A;  . TOTAL HIP ARTHROPLASTY Right   . TOTAL KNEE ARTHROPLASTY  05/29/2012   Procedure: TOTAL KNEE ARTHROPLASTY;  Surgeon: Mcarthur Rossetti, MD;  Location: [facility];  Service: Orthopedics;  Laterality: Right;  Right Total Knee Arthroplasty  . TUBAL LIGATION    . UVULOPALATOPHARYNGOPLASTY    . VAGINAL HYSTERECTOMY     Social History   Occupational History  . Occupation: disability    Comment: back surgeries  Tobacco Use  . Smoking status: Current Every Day Smoker    Packs/day: 1.25    Years: 45.00    Pack years: 56.25    Types: Cigarettes  . Smokeless tobacco: Never Used  . Tobacco comment: form given 12/25/16  Substance and Sexual Activity  . Alcohol use: No    Alcohol/week: 0.0 standard drinks  . Drug use: No    Types: Marijuana    Comment: 01/31/2016 "none since ~ 1980"  . Sexual activity: Never    Partners: Male

## 2019-07-12 ENCOUNTER — Other Ambulatory Visit: Payer: Self-pay | Admitting: Internal Medicine

## 2019-07-13 ENCOUNTER — Other Ambulatory Visit: Payer: Medicare Other

## 2019-07-16 ENCOUNTER — Other Ambulatory Visit: Payer: Medicare Other

## 2019-07-19 ENCOUNTER — Telehealth: Payer: Self-pay | Admitting: Orthopedic Surgery

## 2019-07-19 ENCOUNTER — Other Ambulatory Visit: Payer: Self-pay | Admitting: Surgical

## 2019-07-19 MED ORDER — TRAMADOL HCL 50 MG PO TABS: 50.0000 mg | ORAL_TABLET | Freq: Four times a day (QID) | ORAL | 0 refills | Status: DC | PRN

## 2019-07-19 NOTE — Telephone Encounter (Signed)
Please advise. Thanks.  

## 2019-07-19 NOTE — Telephone Encounter (Signed)
Patient called requesting an RX refill on her Tramadol. She wanted to know if Dr. Marlou Sa would write the RX for 1 pill every 4 hours instead of every 6.  Patient uses [facility] on [address] in [place].  CB#[phone].  Thank you.

## 2019-07-19 NOTE — Telephone Encounter (Signed)
Patient was scheduled for CT Scan on Friday 07-16-19.  I called to discuss surgery date for left reverse shoulder arthroplasty, but patient said she had to reschedule the appointment to 07-27-19.  She called this morning to request Rx for pain.  Note read Tramadol, however patient states she did not request Tramadol, she requested Hydrocodone.  Patient has also received a letter for jury duty for December 16th, 2020.  She is asking for a doctor's note that would excuse her from serving due to the pain associated with her left shoulder.    Patient's cb  [phone]

## 2019-07-20 ENCOUNTER — Other Ambulatory Visit: Payer: Self-pay | Admitting: Specialist

## 2019-07-20 ENCOUNTER — Telehealth: Payer: Self-pay

## 2019-07-20 ENCOUNTER — Telehealth: Payer: Self-pay | Admitting: Specialist

## 2019-07-20 DIAGNOSIS — M75102 Unspecified rotator cuff tear or rupture of left shoulder, not specified as traumatic: Secondary | ICD-10-CM

## 2019-07-20 DIAGNOSIS — M5134 Other intervertebral disc degeneration, thoracic region: Secondary | ICD-10-CM

## 2019-07-20 DIAGNOSIS — IMO0002 Reserved for concepts with insufficient information to code with codable children: Secondary | ICD-10-CM

## 2019-07-20 DIAGNOSIS — M4316 Spondylolisthesis, lumbar region: Secondary | ICD-10-CM

## 2019-07-20 NOTE — Telephone Encounter (Signed)
IC s/w patient and advised note written. Also advised about medication. Patient states she would prefer to have Hydrocodone because it works better for her. Advised her we are unable to provide this for her at this time since it is often used to control pain post operatively.

## 2019-07-20 NOTE — Telephone Encounter (Signed)
Jill Phillips does not wish surgery for her back, she needs to be in a pain management program.  I will order this.

## 2019-07-20 NOTE — Telephone Encounter (Signed)
Pt is requesting a refill on Hydrocodone requesting the directions to be 1 every 6 hours, and Fentanyl. Please have that sent to [facility]    [phone]

## 2019-07-20 NOTE — Telephone Encounter (Signed)
She asked if you could take her off seroquel and replace it with something else. If you would like her to have some sort of OV I will let her know.

## 2019-07-20 NOTE — Telephone Encounter (Signed)
See other note regarding rxrf

## 2019-07-20 NOTE — Telephone Encounter (Signed)
OV

## 2019-07-20 NOTE — Progress Notes (Signed)
Patient is previous surgical patient with T10 to S1 fusion and adjacent level DDD above her fusion, she discontinued pain management and I have evaluated her for consideration of extension of her fusion to the T5 level due to adjacent level degenerative disc disease. I had frank discussion with Jill Phillips and explained that based on the multitude of spine surgeries for pain related to degenerative disc disease and the fact that she is developing changes in the mid lower 1/3 of the  Thoracic spine and the presence of multiple level DDD above the T5 level that any surgery I can offer her is likely only to be temporary in terms of the likelihood that she is going to develop even further changes above her fusion site, this has been the case with all her surgeries done in the past.  I undertook providing her meds with the understanding that we were to attempt a further surgical solution to her pain. After discussion she decided that she does not Wish to proceed with extension of her fusion. I will not continue to prescribe her meds at this point an I am referring her back to pain management.

## 2019-07-20 NOTE — Telephone Encounter (Signed)
She is on several medications now and there is nothing I can add this can help her sleep.  She is already on too many medications that interact with each other and increasing her current medications is not going to help or be safe.

## 2019-07-20 NOTE — Telephone Encounter (Signed)
Copied from [place] [phone]. Topic: Quick Communication - Rx Refill/Question >> Jul 20, 2019 12:57 PM Erick Blinks wrote: Reason for CRM: Pt is not getting sleep, she says sleep Rx is not working. Just scheduled appt for lab work but she wants PCP to know about her Sleep Loss as well

## 2019-07-20 NOTE — Telephone Encounter (Signed)
This request has been sent to Dr. [name]

## 2019-07-21 ENCOUNTER — Other Ambulatory Visit: Payer: Self-pay | Admitting: Internal Medicine

## 2019-07-21 NOTE — Telephone Encounter (Signed)
Last RF 06/24/19 Last OV 06/21/19 Next OV 08/02/19

## 2019-07-21 NOTE — Telephone Encounter (Signed)
It looks like she has already been on a number of medications - we will have to discuss this at her appt on 11/30

## 2019-07-22 ENCOUNTER — Telehealth: Payer: Self-pay | Admitting: Specialist

## 2019-07-22 NOTE — Telephone Encounter (Signed)
Patient left a voicemail message requesting a refill on her Hydrocodone and also her patches.  LO#[phone] or [phone].  Thank you.

## 2019-07-23 ENCOUNTER — Telehealth: Payer: Self-pay

## 2019-07-23 ENCOUNTER — Telehealth: Payer: Self-pay | Admitting: Specialist

## 2019-07-23 ENCOUNTER — Telehealth: Payer: Self-pay | Admitting: Internal Medicine

## 2019-07-23 NOTE — Telephone Encounter (Signed)
I called and lmom advised that we were not going to refill her pain meds as we are not pursuing surgery at this time, she can contact her PCP and see if they can get her something.

## 2019-07-23 NOTE — Telephone Encounter (Signed)
Patient states she would like PCP to refill Pain medications, she does not know when she will be able to get into the pain management clinic. RX refill fentaNYL (DURAGESIC) 50 MCG/HR  HYDROcodone-acetaminophen (NORCO) 10-325 MG tablet  [place], [place]

## 2019-07-23 NOTE — Telephone Encounter (Signed)
Patient called requesting something different for pain. She has been getting norco and pain patches and is out. She said ultram does not work.

## 2019-07-23 NOTE — Telephone Encounter (Signed)
Pt called in requesting a refill on Hydrocodone and Fentanyl, pt said shes been waiting to hear back on her refill. She said she's waiting on trying to get into her pain management clinic but she's waiting on the referral coordinator so she's asking for dr.nitka to refill this last time.   [phone]

## 2019-07-24 NOTE — Telephone Encounter (Signed)
Pt called today. States she will be out of patch tomorrow. Dr [name] will no longer perscribe the pain medications for her and she is asking PCP to prescribe until she can get in with pain clinic. States someone contacted her and told her the referral has been accepted but they were unable to tell her when she would be able to be seen.

## 2019-07-25 NOTE — Telephone Encounter (Signed)
I will not prescribe her pain medication.

## 2019-07-26 ENCOUNTER — Telehealth: Payer: Self-pay | Admitting: Internal Medicine

## 2019-07-26 ENCOUNTER — Other Ambulatory Visit: Payer: Self-pay | Admitting: Surgical

## 2019-07-26 NOTE — Telephone Encounter (Signed)
Already spoke with pt in regards to this issue. Documented in another phone note.

## 2019-07-26 NOTE — Telephone Encounter (Signed)
Patient spoke with [facility] on 07/24/19 at 1050am.  Patient stated that  Nitka at [facility] had been writing Pain Rx for patients back.  States she received a call from the office saying that they were not able to provide pain rx anymore.  Patient states she is waiting on a referral for [facility] but unable to access yet.  States she is about to go into withdrawal from hydrocodone and fentanyl patches and wants to avoid it.  Is requesting follow up call.

## 2019-07-26 NOTE — Telephone Encounter (Signed)
Pt aware.

## 2019-07-26 NOTE — Telephone Encounter (Signed)
I let her know your response below. She is asking if you will be willing to prescribe her pain medication for just 1 month. She has gotten a call from the pain clinic she is just waiting on a response for her appointment date. She is already going through withdrawals and she states she has appointments coming up and she worried about the shape she is going to be in from withdrawals.

## 2019-07-27 ENCOUNTER — Telehealth: Payer: Self-pay | Admitting: Internal Medicine

## 2019-07-27 ENCOUNTER — Ambulatory Visit
Admission: RE | Admit: 2019-07-27 | Discharge: 2019-07-27 | Disposition: A | Discharge status: Home / Self Care | Payer: Medicare Other | Source: Ambulatory Visit | Attending: Orthopedic Surgery | Admitting: Orthopedic Surgery

## 2019-07-27 DIAGNOSIS — M12812 Other specific arthropathies, not elsewhere classified, left shoulder: Secondary | ICD-10-CM

## 2019-07-27 DIAGNOSIS — M19012 Primary osteoarthritis, left shoulder: Secondary | ICD-10-CM | POA: Diagnosis not present

## 2019-07-27 NOTE — Telephone Encounter (Signed)
Spoke with Jill Phillips and advised Dr. Quay Burow will not send in her pain medication. Jill Phillips expressed understanding.

## 2019-07-27 NOTE — Telephone Encounter (Signed)
Pt aware that you will not write her pain medication.

## 2019-07-27 NOTE — Telephone Encounter (Signed)
She should get it from whoever wrote it for her last.  I am not writing her pain medication

## 2019-07-27 NOTE — Telephone Encounter (Signed)
Noted. LMVM for patient advising.

## 2019-07-27 NOTE — Telephone Encounter (Signed)
Copied from [place] [id number]. Topic: General - Other >> Jul 27, 2019  8:21 AM Keene Breath wrote: Reason for CRM: Patient called to ask the doctor to send in a script for her pain medication to last her until she has her appt. On 12/4 for pain management.  She stated that she is in a lot of pain and needs it until her appt.  CB# [phone]

## 2019-08-01 ENCOUNTER — Emergency Department (HOSPITAL_COMMUNITY)
Admission: EM | Admit: 2019-08-01 | Discharge: 2019-08-01 | Disposition: A | Discharge status: Home / Self Care | Payer: Medicare Other | Attending: Emergency Medicine | Admitting: Emergency Medicine

## 2019-08-01 ENCOUNTER — Other Ambulatory Visit: Payer: Self-pay

## 2019-08-01 ENCOUNTER — Encounter (HOSPITAL_COMMUNITY): Payer: Self-pay

## 2019-08-01 DIAGNOSIS — I129 Hypertensive chronic kidney disease with stage 1 through stage 4 chronic kidney disease, or unspecified chronic kidney disease: Secondary | ICD-10-CM | POA: Diagnosis not present

## 2019-08-01 DIAGNOSIS — M25512 Pain in left shoulder: Secondary | ICD-10-CM | POA: Diagnosis not present

## 2019-08-01 DIAGNOSIS — M549 Dorsalgia, unspecified: Secondary | ICD-10-CM | POA: Diagnosis not present

## 2019-08-01 DIAGNOSIS — N183 Chronic kidney disease, stage 3 unspecified: Secondary | ICD-10-CM | POA: Diagnosis not present

## 2019-08-01 DIAGNOSIS — M25562 Pain in left knee: Secondary | ICD-10-CM | POA: Insufficient documentation

## 2019-08-01 DIAGNOSIS — J449 Chronic obstructive pulmonary disease, unspecified: Secondary | ICD-10-CM | POA: Diagnosis not present

## 2019-08-01 DIAGNOSIS — Z760 Encounter for issue of repeat prescription: Secondary | ICD-10-CM | POA: Diagnosis not present

## 2019-08-01 DIAGNOSIS — G8929 Other chronic pain: Secondary | ICD-10-CM | POA: Diagnosis not present

## 2019-08-01 DIAGNOSIS — F17210 Nicotine dependence, cigarettes, uncomplicated: Secondary | ICD-10-CM | POA: Insufficient documentation

## 2019-08-01 DIAGNOSIS — Z96659 Presence of unspecified artificial knee joint: Secondary | ICD-10-CM | POA: Insufficient documentation

## 2019-08-01 DIAGNOSIS — E039 Hypothyroidism, unspecified: Secondary | ICD-10-CM | POA: Insufficient documentation

## 2019-08-01 DIAGNOSIS — Z79899 Other long term (current) drug therapy: Secondary | ICD-10-CM | POA: Insufficient documentation

## 2019-08-01 MED ORDER — METHOCARBAMOL 500 MG PO TABS: 500.0000 mg | ORAL_TABLET | Freq: Two times a day (BID) | ORAL | 0 refills | Status: DC

## 2019-08-01 MED ORDER — LIDOCAINE 5 % EX PTCH: 1.0000 | MEDICATED_PATCH | CUTANEOUS | 0 refills | Status: DC

## 2019-08-01 NOTE — ED Triage Notes (Signed)
Patient states she has had left shoulder pain x 1 year. Patient states her doctor cut her off of her pain medication without any warning. Patient states, "I have been on a Fentanyl patch for 15 years and Hydrocodone x 5 years." Patient states she has not had her Fentanyl patch x 1 week and no Hydrocodone x 3 days.

## 2019-08-01 NOTE — Discharge Instructions (Signed)
You have been diagnosed today with chronic pain, medication refill.  At this time there does not appear to be the presence of an emergent medical condition, however there is always the potential for conditions to change. Please read and follow the below instructions.  Please return to the Emergency Department immediately for any new or worsening symptoms. Please be sure to follow up with your Primary Care Provider within one week regarding your visit today; please call their office to schedule an appointment even if you are feeling better for a follow-up visit. You may use the muscle relaxer Robaxin as prescribed to help with your symptoms.  Do not drive or operate heavy machinery while taking Robaxin as it will make you drowsy.  Do not drink alcohol or take other sedating medications while taking Robaxin as this will worsen side effects. You may use the Lidoderm patches as prescribed today to help with your symptoms. Please call your orthopedist and your primary care provider to schedule follow-up appointments.  Get help right away if: You lose feeling or have numbness in your body. You lose control of bowel or bladder function. Your pain suddenly gets much worse. You develop shaking or chills. You develop confusion. You develop chest pain. You have trouble breathing or shortness of breath. You pass out. You have thoughts about hurting yourself or others. You have any new/concerning or worsening of symptoms  Please read the additional information packets attached to your discharge summary.  Do not take your medicine if  develop an itchy rash, swelling in your mouth or lips, or difficulty breathing; call 911 and seek immediate emergency medical attention if this occurs.  Note: Portions of this text may have been transcribed using voice recognition software. Every effort was made to ensure accuracy; however, inadvertent computerized transcription errors may still be present.

## 2019-08-01 NOTE — Patient Instructions (Signed)
  Tests ordered today. Your results will be released to [place] (or called to you) after review.  If any changes need to be made, you will be notified at that same time.   No immunization administered today.   Medications reviewed and updated.  Changes include :     Your prescription(s) have been submitted to your pharmacy. Please take as directed and contact our office if you believe you are having problem(s) with the medication(s).  A referral was ordered for   Please followup in XX months

## 2019-08-01 NOTE — Progress Notes (Signed)
Subjective:    Patient ID: Jill Phillips, female    DOB: 07-19-1956, 63 y.o.   MRN: [id number]  HPI   Medications and allergies reviewed with patient and updated if appropriate.  Patient Active Problem List   Diagnosis Date Noted  . Anxiety 05/04/2019  . Insomnia 05/04/2019  . Headache 12/08/2018  . Syncope 12/08/2018  . COPD with exacerbation ([place]) 11/26/2018  . Disorder of rotator cuff syndrome of left shoulder and allied disorder 06/22/2018  . Leg edema, left 06/04/2018  . Type 2 diabetes mellitus without complication, without long-term current use of insulin ([place]) 12/02/2017  . Sacral pain 09/17/2017  . COPD (chronic obstructive pulmonary disease) ([place]) 04/21/2017  . Lower back pain 03/03/2017  . CKD (chronic kidney disease) stage 3, GFR 30-59 ml/min 08/07/2016  . GERD (gastroesophageal reflux disease) 08/07/2016  . Chronic respiratory failure ([place]) 07/29/2016  . Arthritis of carpometacarpal [facility]) joint of left thumb 07/09/2016  . Pneumothorax on left 01/31/2016  . Chronic, continuous use of opioids 09/06/2015  . Degenerative disc disease, lumbar 08/01/2015    Class: Chronic  . Spondylolisthesis of lumbar region 08/01/2015    Class: Chronic  . Urinary, incontinence, stress female 05/06/2014  . Constipation 05/05/2014  . 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 HTN (hypertension) 05/19/2013  . HLD (hyperlipidemia) 05/19/2013  . Depression 05/19/2013  . Hypothyroidism 05/19/2013  . Tobacco abuse   . Osteoarthritis of both knees 11/18/2011  . RLS (restless legs syndrome) 11/18/2011    Current Outpatient Medications on File Prior to Visit  Medication Sig Dispense Refill  . albuterol (PROAIR HFA) 108 (90 Base) MCG/ACT inhaler INHALE 1 PUFF INTO THE LUNGS EVERY 6 HOURS AS NEEDED FOR WHEEZING OR SHORTNESS OF BREATH.NEED OFFICE VISIT FOR FURTHER REFILLS 8.5 g 0  . amLODipine (NORVASC) 10 MG tablet TAKE 1 TABLET BY MOUTH DAILY. 90 tablet 1  . aspirin-acetaminophen-caffeine (EXCEDRIN  MIGRAINE) 250-250-65 MG tablet Take 1 tablet by mouth every 6 (six) hours as needed for headache.    [name] atorvastatin (LIPITOR) 20 MG tablet TAKE 1 TABLET BY MOUTH DAILY. 90 tablet 0  . benazepril (LOTENSIN) 40 MG tablet TAKE 1 TABLET BY MOUTH DAILY. 90 tablet 0  . Blood Glucose Monitoring Suppl (ONETOUCH VERIO FLEX SYSTEM) w/Device KIT USE TO TEST BLOOD SUGAR UP TO 4 TIMES A DAY 1 kit 0  . clonazePAM (KLONOPIN) 1 MG tablet TAKE 1 TABLET BY MOUTH 2 TIMES DAILY AS NEEDED FOR ANXIETY 60 tablet 0  . diphenhydrAMINE (BENADRYL) 25 MG tablet Take 25-50 mg by mouth every 6 (six) hours as needed (bladder).    . fentaNYL (DURAGESIC) 50 MCG/HR PLACE 1 PATCH ONTO THE SKIN EVERY 3 DAYS. 10 patch 0  . gabapentin (NEURONTIN) 400 MG capsule Take 1 capsule (400 mg total) by mouth 3 (three) times daily. 270 capsule 1  . HYDROcodone-acetaminophen (NORCO) 10-325 MG tablet TAKE 1 TABLET BY MOUTH EVERY 6 HOURS AS NEEDED. 120 tablet 0  . levothyroxine (SYNTHROID) 88 MCG tablet TAKE 1 TABLET BY MOUTH DAILY. 90 tablet 1  . Meth-Hyo-M Bl-Na Phos-Ph Sal (URIBEL) 118 MG CAPS Take by mouth 3 (three) times daily.     [name] omeprazole (PRILOSEC) 40 MG capsule TAKE 1 CAPSULE BY MOUTH 2 TIMES DAILY BEFORE A MEAL. 180 capsule 3  . PROAIR HFA 108 (90 Base) MCG/ACT inhaler INHALE 2 PUFFS INTO THE LUNGS EVERY 6 HOURS AS NEEDED FOR WHEEZING OR SHORTNESS OF BREATH. 8 g 0  . QUEtiapine (SEROQUEL) 100 MG tablet TAKE  1 TABLET BY MOUTH AT BEDTIME. 30 tablet 3  . rOPINIRole (REQUIP) 0.5 MG tablet TAKE 2 TABLETS BY MOUTH AT BEDTIME. 60 tablet 2  . Sennosides (SENNA LAX PO) Take 1 tablet by mouth daily. As directed    . SPIRIVA HANDIHALER 18 MCG inhalation capsule PLACE 1 CAPSULE INTO INHALER AND INHALE ONCE DAILY. 30 capsule 0  . tiZANidine (ZANAFLEX) 4 MG capsule Take 1 capsule (4 mg total) by mouth 3 (three) times daily. 90 capsule 6  . traMADol (ULTRAM) 50 MG tablet TAKE 1 TABLET BY MOUTH EVERY 6 HOURS AS NEEDED. 30 tablet 0  . valACYclovir  (VALTREX) 500 MG tablet TAKE 1 TABLET BY MOUTH DAILY. 60 tablet 0  . venlafaxine XR (EFFEXOR-XR) 150 MG 24 hr capsule TAKE 2 CAPSULES BY MOUTH DAILY. 180 capsule 0  . Vitamin D, Ergocalciferol, (DRISDOL) 50000 units CAPS capsule Take 1 capsule (50,000 Units total) by mouth every 7 (seven) days. 8 capsule 0   Current Facility-Administered Medications on File Prior to Visit  Medication Dose Route Frequency Provider Last Rate Last Dose  . calcitonin (salmon) (MIACALCIN/FORTICAL) nasal spray 1 spray  1 spray Alternating Nares Daily [name], MD        Past Medical History:  Diagnosis Date  . Active smoker   . Anxiety   . Calcifying tendinitis of shoulder   . Chronic back pain   . Chronic pain syndrome   . COPD (chronic obstructive pulmonary disease) ([place])   . Dysthymic disorder   . Emphysema lung ([place])   . GERD (gastroesophageal reflux disease)   . Headache    "weekly maybe" (01/31/2016)  . Heart murmur    for years, nothing to be concerned about  . Herpes genitalia   . History of blood transfusion 1980   related to "back surgery"  . Hyperlipidemia   . Hypertension   . Hypothyroidism   . Lumbago   . Osteoarthrosis, unspecified whether generalized or localized, lower leg   . Pain in joint, upper arm   . Pneumothorax, left 01/31/2016   S/P Left posterior subcostal pain injection on 01/30/2016  . PONV (postoperative nausea and vomiting)    gets nauseous  with longer surgery. Difficuty voiding after surgery  . Postlaminectomy syndrome, thoracic region   . Primary localized osteoarthrosis, lower leg   . Restless leg syndrome   . Sleep apnea    s/p surgery- last sleep study 2011- doesnt use oxygen or machine at night as instructed,.   12/2014- Dr Halford Chessman  reports it is negative.  . Thyroid disease     Past Surgical History:  Procedure Laterality Date  . APPENDECTOMY    . BACK SURGERY     18 back surgeries (2 thoracic & 16 lumbar) (01/31/2016)  . DILATION AND CURETTAGE OF UTERUS     . HAMMER TOE SURGERY    . IR RADIOLOGIST EVAL & MGMT  07/21/2017  . JOINT REPLACEMENT    . KNEE ARTHROSCOPY Right   . LAPAROSCOPIC CHOLECYSTECTOMY    . LUMBAR FUSION N/A 08/01/2015   Procedure: Right sided L1-2 and L2-3 transforaminal lumbar interbody fusion with cages, Extension of posterior fusion T12 to L3, Replaced pedicle screws bilaterally L1-L2 , Replaced left sided pedicle screws L-3. Instrumentation T12 to L3 using local bone graft, Vivigen allograft and cancellous chips;  Surgeon: [name], MD;  Location: [facility];  Service: Orthopedics;  Laterality: N/A;  . LUMBAR LAMINECTOMY/DECOMPRESSION MICRODISCECTOMY N/A 01/28/2014   Procedure: Minimally Invasive Right  L1-2  Microdiscectomy;  Surgeon: [name], MD;  Location: [facility];  Service: Orthopedics;  Laterality: N/A;  . TOTAL HIP ARTHROPLASTY Right   . TOTAL KNEE ARTHROPLASTY  05/29/2012   Procedure: TOTAL KNEE ARTHROPLASTY;  Surgeon: Mcarthur Rossetti, MD;  Location: [facility];  Service: Orthopedics;  Laterality: Right;  Right Total Knee Arthroplasty  . TUBAL LIGATION    . UVULOPALATOPHARYNGOPLASTY    . VAGINAL HYSTERECTOMY      Social History   Socioeconomic History  . Marital status: Divorced    Spouse name: n/a  . Number of children: 2  . Years of education: 12+  . Highest education level: Not on file  Occupational History  . Occupation: disability    Comment: back surgeries  Social Needs  . Financial resource strain: Not on file  . Food insecurity    Worry: Not on file    Inability: Not on file  . Transportation needs    Medical: Not on file    Non-medical: Not on file  Tobacco Use  . Smoking status: Current Every Day Smoker    Packs/day: 1.25    Years: 45.00    Pack years: 56.25    Types: Cigarettes  . Smokeless tobacco: Never Used  . Tobacco comment: form given 12/25/16  Substance and Sexual Activity  . Alcohol use: No    Alcohol/week: 0.0 standard drinks  . Drug use: No    Types: Marijuana     Comment: 01/31/2016 "none since ~ 1980"  . Sexual activity: Never    Partners: Male  Lifestyle  . Physical activity    Days per week: Not on file    Minutes per session: Not on file  . Stress: Not on file  Relationships  . Social Herbalist on phone: Not on file    Gets together: Not on file    Attends religious service: Not on file    Active member of club or organization: Not on file    Attends meetings of clubs or organizations: Not on file    Relationship status: Not on file  Other Topics Concern  . Not on file  Social History Narrative   Lives alone.  One daughter is local, but is getting ready to move to [place], where her children live with their father.  The other daughter lives near [place], [place].    Family History  Problem Relation Age of Onset  . Kidney disease Mother   . Heart disease Father   . Anuerysm Brother 29       brain  . Heart disease Brother   . Heart disease Sister 9       s/p CABG  . Hypertension Sister   . Colon cancer Neg Hx     Review of Systems     Objective:  There were no vitals filed for this visit. BP Readings from Last 3 Encounters:  06/17/19 (!) 160/86  05/26/19 140/88  04/26/19 (!) 159/95   Wt Readings from Last 3 Encounters:  07/05/19 147 lb (66.7 kg)  06/17/19 145 lb (65.8 kg)  05/26/19 145 lb (65.8 kg)   There is no height or weight on file to calculate BMI.   Physical Exam          Assessment & Plan:    See Problem List for Assessment and Plan of chronic medical problems.   This encounter was created in error - please disregard.

## 2019-08-01 NOTE — ED Provider Notes (Signed)
[facility] DEPT Provider Note   CSN: [id number] Arrival date & time: 08/01/19  1419     History   Chief Complaint Chief Complaint  Patient presents with  . Shoulder Pain  . Medication Refill    HPI Jill Phillips is a 63 y.o. female history chronic pain, COPD, GERD, hypertension, hyperlipidemia, hypothyroidism, osteoarthrosis, restless leg, sleep apnea, multiple back surgeries, CKD, knee replacement.  Patient reports that she has run out of her fentanyl patches and hydrocodone 7 days ago.  She reports that since that time she has had increased to her chronic back, left shoulder and left knee pain.  She reports that she has had this pain for many years and she follows up with orthopedics for this.  Patient reports that for more than 5 years she is been receiving narcotics for her pain up until 1 week ago she was taking off of her medicines by orthopedics.  She attempted to contact her PCP to fill her narcotic prescriptions but they refused so she comes here today for refill.  She is requesting fentanyl patches and hydrocodone tens.  Patient reports that she has scheduled another back surgery as well as left shoulder replacement and left knee replacement with her orthopedics that they plan on doing next year.  Patient describes constant severe pain of her entire body most specifically of the back, left shoulder and left knee aching nonradiating no clear aggravating factors only relieved with narcotics.  She is not attempted any other medications prior to arrival.  She denies any abnormal features of her pain today.  Denies fever/chills, fall/injury, swelling/color change, headache, neck pain, chest pain/shortness of breath, abdominal pain, nausea/vomiting, skin changes, drainage, numbness/weakness, tingling or any additional concerns.     HPI  Past Medical History:  Diagnosis Date  . Active smoker   . Anxiety   . Calcifying tendinitis of shoulder   .  Chronic back pain   . Chronic pain syndrome   . COPD (chronic obstructive pulmonary disease) ([place])   . Dysthymic disorder   . Emphysema lung ([place])   . GERD (gastroesophageal reflux disease)   . Headache    "weekly maybe" (01/31/2016)  . Heart murmur    for years, nothing to be concerned about  . Herpes genitalia   . History of blood transfusion 1980   related to "back surgery"  . Hyperlipidemia   . Hypertension   . Hypothyroidism   . Lumbago   . Osteoarthrosis, unspecified whether generalized or localized, lower leg   . Pain in joint, upper arm   . Pneumothorax, left 01/31/2016   S/P Left posterior subcostal pain injection on 01/30/2016  . PONV (postoperative nausea and vomiting)    gets nauseous  with longer surgery. Difficuty voiding after surgery  . Postlaminectomy syndrome, thoracic region   . Primary localized osteoarthrosis, lower leg   . Restless leg syndrome   . Sleep apnea    s/p surgery- last sleep study 2011- doesnt use oxygen or machine at night as instructed,.   12/2014- Dr Halford Chessman  reports it is negative.  . Thyroid disease     Patient Active Problem List   Diagnosis Date Noted  . Anxiety 05/04/2019  . Insomnia 05/04/2019  . Headache 12/08/2018  . Syncope 12/08/2018  . COPD with exacerbation ([place]) 11/26/2018  . Disorder of rotator cuff syndrome of left shoulder and allied disorder 06/22/2018  . Leg edema, left 06/04/2018  . Type 2 diabetes mellitus without complication, without long-term  current use of insulin ([place]) 12/02/2017  . Sacral pain 09/17/2017  . COPD (chronic obstructive pulmonary disease) ([place]) 04/21/2017  . Lower back pain 03/03/2017  . CKD (chronic kidney disease) stage 3, GFR 30-59 ml/min 08/07/2016  . GERD (gastroesophageal reflux disease) 08/07/2016  . Chronic respiratory failure ([place]) 07/29/2016  . Arthritis of carpometacarpal [facility]) joint of left thumb 07/09/2016  . Pneumothorax on left 01/31/2016  . Chronic, continuous use of opioids  09/06/2015  . Degenerative disc disease, lumbar 08/01/2015    Class: Chronic  . Spondylolisthesis of lumbar region 08/01/2015    Class: Chronic  . Urinary, incontinence, stress female 05/06/2014  . Constipation 05/05/2014  . 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 HTN (hypertension) 05/19/2013  . HLD (hyperlipidemia) 05/19/2013  . Depression 05/19/2013  . Hypothyroidism 05/19/2013  . Tobacco abuse   . Osteoarthritis of both knees 11/18/2011  . RLS (restless legs syndrome) 11/18/2011    Past Surgical History:  Procedure Laterality Date  . APPENDECTOMY    . BACK SURGERY     18 back surgeries (2 thoracic & 16 lumbar) (01/31/2016)  . DILATION AND CURETTAGE OF UTERUS    . HAMMER TOE SURGERY    . IR RADIOLOGIST EVAL & MGMT  07/21/2017  . JOINT REPLACEMENT    . KNEE ARTHROSCOPY Right   . LAPAROSCOPIC CHOLECYSTECTOMY    . LUMBAR FUSION N/A 08/01/2015   Procedure: Right sided L1-2 and L2-3 transforaminal lumbar interbody fusion with cages, Extension of posterior fusion T12 to L3, Replaced pedicle screws bilaterally L1-L2 , Replaced left sided pedicle screws L-3. Instrumentation T12 to L3 using local bone graft, Vivigen allograft and cancellous chips;  Surgeon: [name], MD;  Location: [facility];  Service: Orthopedics;  Laterality: N/A;  . LUMBAR LAMINECTOMY/DECOMPRESSION MICRODISCECTOMY N/A 01/28/2014   Procedure: Minimally Invasive Right  L1-2 Microdiscectomy;  Surgeon: [name], MD;  Location: [facility];  Service: Orthopedics;  Laterality: N/A;  . TOTAL HIP ARTHROPLASTY Right   . TOTAL KNEE ARTHROPLASTY  05/29/2012   Procedure: TOTAL KNEE ARTHROPLASTY;  Surgeon: Mcarthur Rossetti, MD;  Location: [facility];  Service: Orthopedics;  Laterality: Right;  Right Total Knee Arthroplasty  . TUBAL LIGATION    . UVULOPALATOPHARYNGOPLASTY    . VAGINAL HYSTERECTOMY       OB History   No obstetric history on file.      Home Medications    Prior to Admission medications   Medication Sig Start Date  End Date Taking? Authorizing Provider  albuterol (PROAIR HFA) 108 (90 Base) MCG/ACT inhaler INHALE 1 PUFF INTO THE LUNGS EVERY 6 HOURS AS NEEDED FOR WHEEZING OR SHORTNESS OF BREATH.NEED OFFICE VISIT FOR FURTHER REFILLS 09/05/16   Nche, Charlene Brooke, NP  amLODipine (NORVASC) 10 MG tablet TAKE 1 TABLET BY MOUTH DAILY. 07/02/19   [name], MD  aspirin-acetaminophen-caffeine (EXCEDRIN MIGRAINE) [phone] MG tablet Take 1 tablet by mouth every 6 (six) hours as needed for headache.    [provider]  atorvastatin (LIPITOR) 20 MG tablet TAKE 1 TABLET BY MOUTH DAILY. 02/22/19   [name], MD  benazepril (LOTENSIN) 40 MG tablet TAKE 1 TABLET BY MOUTH DAILY. 05/04/19   [name], MD  Blood Glucose Monitoring Suppl (ONETOUCH VERIO FLEX SYSTEM) w/Device KIT USE TO TEST BLOOD SUGAR UP TO 4 TIMES A DAY 06/08/18   Burns, Claudina Lick, MD  clonazePAM (KLONOPIN) 1 MG tablet TAKE 1 TABLET BY MOUTH 2 TIMES DAILY AS NEEDED FOR ANXIETY 07/21/19   Billey Gosling  J, MD  diphenhydrAMINE (BENADRYL) 25 MG tablet Take 25-50 mg by mouth every 6 (six) hours as needed (bladder).    [provider]  fentaNYL (DURAGESIC) 50 MCG/HR PLACE 1 PATCH ONTO THE SKIN EVERY 3 DAYS. 06/22/19   [name], MD  gabapentin (NEURONTIN) 400 MG capsule Take 1 capsule (400 mg total) by mouth 3 (three) times daily. 06/21/19   [name], MD  HYDROcodone-acetaminophen (NORCO) 10-325 MG tablet TAKE 1 TABLET BY MOUTH EVERY 6 HOURS AS NEEDED. 06/22/19   [name], MD  levothyroxine (SYNTHROID) 88 MCG tablet TAKE 1 TABLET BY MOUTH DAILY. 03/08/19   [name], MD  lidocaine (LIDODERM) 5 % Place 1 patch onto the skin daily. Remove & Discard patch within 12 hours or as directed by MD 08/01/19   Nuala Alpha A, PA-C  Meth-Hyo-M Bl-Na Phos-Ph Sal (URIBEL) 118 MG CAPS Take by mouth 3 (three) times daily.     [provider]  methocarbamol (ROBAXIN) 500 MG tablet Take 1 tablet (500 mg total) by mouth 2 (two)  times daily. 08/01/19   Nuala Alpha A, PA-C  omeprazole (PRILOSEC) 40 MG capsule TAKE 1 CAPSULE BY MOUTH 2 TIMES DAILY BEFORE A MEAL. 08/28/18   [name], MD  PROAIR HFA 108 [phone] Base) MCG/ACT inhaler INHALE 2 PUFFS INTO THE LUNGS EVERY 6 HOURS AS NEEDED FOR WHEEZING OR SHORTNESS OF BREATH. 06/12/18   Parrett, Fonnie Mu, NP  QUEtiapine (SEROQUEL) 100 MG tablet TAKE 1 TABLET BY MOUTH AT BEDTIME. 07/02/19   Burns, Claudina Lick, MD  rOPINIRole (REQUIP) 0.5 MG tablet TAKE 2 TABLETS BY MOUTH AT BEDTIME. 06/12/18   Meredith Staggers, MD  Sennosides (SENNA LAX PO) Take 1 tablet by mouth daily. As directed 05/20/17   [provider]  SPIRIVA HANDIHALER 18 MCG inhalation capsule PLACE 1 CAPSULE INTO INHALER AND INHALE ONCE DAILY. 04/02/17   Chesley Mires, MD  traMADol (ULTRAM) 50 MG tablet TAKE 1 TABLET BY MOUTH EVERY 6 HOURS AS NEEDED. 07/26/19   Magnant, Charles L, PA-C  valACYclovir (VALTREX) 500 MG tablet TAKE 1 TABLET BY MOUTH DAILY. 06/24/19   [name], MD  venlafaxine XR (EFFEXOR-XR) 150 MG 24 hr capsule TAKE 2 CAPSULES BY MOUTH DAILY. 07/12/19   [name], MD  Vitamin D, Ergocalciferol, (DRISDOL) 50000 units CAPS capsule Take 1 capsule (50,000 Units total) by mouth every 7 (seven) days. 03/07/17   [name], MD    Family History Family History  Problem Relation Age of Onset  . Kidney disease Mother   . Heart disease Father   . Anuerysm Brother 29       brain  . Heart disease Brother   . Heart disease Sister 61       s/p CABG  . Hypertension Sister   . Colon cancer Neg Hx     Social History Social History   Tobacco Use  . Smoking status: Current Every Day Smoker    Packs/day: 1.25    Years: 45.00    Pack years: 56.25    Types: Cigarettes  . Smokeless tobacco: Never Used  . Tobacco comment: form given 12/25/16  Substance Use Topics  . Alcohol use: No    Alcohol/week: 0.0 standard drinks  . Drug use: No    Types: Marijuana    Comment: 01/31/2016 "none since ~  1980"     Allergies   Flagyl [metronidazole], Limonene, Sulfa antibiotics, Doxycycline, Amoxicillin, Chlorzoxazone, Codeine, Darvocet [propoxyphene n-acetaminophen],  Dilaudid [hydromorphone hcl], Keflex [cephalexin], Morphine and related, Nitrofurantoin monohyd macro, and Percocet [oxycodone-acetaminophen]   Review of Systems Review of Systems Ten systems are reviewed and are negative for acute change except as noted in the HPI   Physical Exam Updated Vital Signs BP 137/87 (BP Location: Right Arm)   Pulse 98   Temp 98 F (36.7 C) (Oral)   Resp 17   Ht _0  (1.626 m)   Wt 66.7 kg   SpO2 98%   BMI 25.23 kg/m   Physical Exam Constitutional:      General: She is not in acute distress.    Appearance: Normal appearance. She is well-developed. She is not ill-appearing or diaphoretic.  HENT:     Head: Normocephalic and atraumatic.     Right Ear: External ear normal.     Left Ear: External ear normal.     Nose: Nose normal.  Eyes:     General: Vision grossly intact. Gaze aligned appropriately.     Pupils: Pupils are equal, round, and reactive to light.  Neck:     Musculoskeletal: Normal range of motion.     Trachea: Trachea and phonation normal. No tracheal deviation.  Pulmonary:     Effort: Pulmonary effort is normal. No respiratory distress.  Musculoskeletal: Normal range of motion.  Skin:    General: Skin is warm and dry.  Neurological:     Mental Status: She is alert.     GCS: GCS eye subscore is 4. GCS verbal subscore is 5. GCS motor subscore is 6.     Comments: Speech is clear and goal oriented, follows commands Major Cranial nerves without deficit, no facial droop Moves extremities without ataxia, coordination intact  Psychiatric:        Behavior: Behavior normal.      ED Treatments / Results  Labs (all labs ordered are listed, but only abnormal results are displayed) Labs Reviewed - No data to display  EKG None  Radiology No results  found.  Procedures Procedures (including critical care time)  Medications Ordered in ED Medications - No data to display   Initial Impression / Assessment and Plan / ED Course  I have reviewed the triage vital signs and the nursing notes.  Pertinent labs & imaging results that were available during my care of the patient were reviewed by me and considered in my medical decision making (see chart for details).     Patient arrives requesting refill of fentanyl and hydrocodone for her chronic pain.  She denies any new injuries or abnormal features to her pain.  She reports she has been out of medication for the past 7 days.  No history of infectious-like symptoms.  She is neurovascular intact to all 4 extremities without evidence of DVT, septic joint, compartment syndrome or other acute pathologies.  I had a long discussion with the patient and informed her that we would not be refilling her narcotic medications from the ER today.  She is agreeable to attempt muscle relaxer medication and Lidoderm patches and is going to follow-up with her PCP and orthopedist.  Patient given precautions regarding muscle relaxers and states understanding.  On reassessment at discharge patient resting comfortably drinking a Dr. Malachi Bonds has no further questions or complaints.  At this time there does not appear to be any evidence of an acute emergency medical condition and the patient appears stable for discharge with appropriate outpatient follow up. Diagnosis was discussed with patient who verbalizes understanding of care plan and  is agreeable to discharge. I have discussed return precautions with patient who verbalizes understanding of return precautions. Patient encouraged to follow-up with their PCP and orthopedics. All questions answered.  Patient has been discharged in good condition.  Note: Portions of this report may have been transcribed using voice recognition software. Every effort was made to ensure  accuracy; however, inadvertent computerized transcription errors may still be present. Final Clinical Impressions(s) / ED Diagnoses   Final diagnoses:  Other chronic pain  Medication refill    ED Discharge Orders         Ordered    methocarbamol (ROBAXIN) 500 MG tablet  2 times daily     08/01/19 1707    lidocaine (LIDODERM) 5 %  Every 24 hours     08/01/19 1707           Gari Crown 08/01/19 1718    Malvin Johns, MD 08/01/19 1800

## 2019-08-02 ENCOUNTER — Telehealth: Payer: Self-pay | Admitting: Orthopedic Surgery

## 2019-08-02 ENCOUNTER — Encounter: Payer: Medicare Other | Admitting: Internal Medicine

## 2019-08-02 ENCOUNTER — Other Ambulatory Visit: Payer: Self-pay | Admitting: Surgical

## 2019-08-02 ENCOUNTER — Other Ambulatory Visit: Payer: Self-pay | Admitting: Internal Medicine

## 2019-08-02 ENCOUNTER — Telehealth: Payer: Self-pay | Admitting: Radiology

## 2019-08-02 NOTE — Telephone Encounter (Signed)
Please advise. Thanks.  

## 2019-08-02 NOTE — Telephone Encounter (Signed)
Patient is scheduled for left reverse shoulder replacement at [facility] Sep 09, 2019 @12 :15pm with Dr. Marlou Sa.  Patient is requesting Rx Tramadol be called into [place].   Pt's cb  [phone]

## 2019-08-02 NOTE — Telephone Encounter (Signed)
Patient called and lmom for Jill Phillips on Saturday 07/31/2019, she states that she does not know why Dr. [name] wouldn't refill her pain meds, that she is having restless leg problems, and that she is going thru withdrawal symptoms from these meds. I did let Dr. [name] listen to the voicemail.  He states that she needs to go to [employer] at [facility] or an [place] clinic. And that since we are not doing surgery on her that we can not keep writing those rxs.  I called and she did not answer the phone but I did leave her a message advising her of what Dr. [name] had advised.

## 2019-08-03 NOTE — Telephone Encounter (Signed)
gd pt

## 2019-08-04 NOTE — Telephone Encounter (Signed)
Ok to rf? 

## 2019-08-06 DIAGNOSIS — M545 Low back pain: Secondary | ICD-10-CM | POA: Diagnosis not present

## 2019-08-06 DIAGNOSIS — M961 Postlaminectomy syndrome, not elsewhere classified: Secondary | ICD-10-CM | POA: Diagnosis not present

## 2019-08-06 DIAGNOSIS — G894 Chronic pain syndrome: Secondary | ICD-10-CM | POA: Diagnosis not present

## 2019-08-06 DIAGNOSIS — G8929 Other chronic pain: Secondary | ICD-10-CM | POA: Diagnosis not present

## 2019-08-06 DIAGNOSIS — M25511 Pain in right shoulder: Secondary | ICD-10-CM | POA: Diagnosis not present

## 2019-08-09 DIAGNOSIS — G894 Chronic pain syndrome: Secondary | ICD-10-CM | POA: Diagnosis not present

## 2019-08-09 DIAGNOSIS — M961 Postlaminectomy syndrome, not elsewhere classified: Secondary | ICD-10-CM | POA: Diagnosis not present

## 2019-08-16 ENCOUNTER — Other Ambulatory Visit: Payer: Self-pay | Admitting: Internal Medicine

## 2019-08-16 NOTE — Telephone Encounter (Signed)
[place] Controlled Database Checked Last filled: 07/21/19 #60 LOV w/you: 08/02/19 Next appt w/you: None

## 2019-08-19 ENCOUNTER — Telehealth: Payer: Self-pay | Admitting: Internal Medicine

## 2019-08-19 ENCOUNTER — Encounter: Payer: Self-pay | Admitting: Internal Medicine

## 2019-08-19 ENCOUNTER — Ambulatory Visit (INDEPENDENT_AMBULATORY_CARE_PROVIDER_SITE_OTHER): Payer: Medicare Other | Admitting: Internal Medicine

## 2019-08-19 ENCOUNTER — Other Ambulatory Visit: Payer: Self-pay | Admitting: Internal Medicine

## 2019-08-19 DIAGNOSIS — R110 Nausea: Secondary | ICD-10-CM | POA: Diagnosis not present

## 2019-08-19 DIAGNOSIS — F419 Anxiety disorder, unspecified: Secondary | ICD-10-CM

## 2019-08-19 DIAGNOSIS — G44309 Post-traumatic headache, unspecified, not intractable: Secondary | ICD-10-CM

## 2019-08-19 MED ORDER — ONDANSETRON HCL 4 MG PO TABS: 4.0000 mg | ORAL_TABLET | Freq: Three times a day (TID) | ORAL | 0 refills | Status: DC | PRN

## 2019-08-19 MED ORDER — PROMETHAZINE HCL 25 MG PO TABS: 25.0000 mg | ORAL_TABLET | Freq: Three times a day (TID) | ORAL | 0 refills | Status: DC | PRN

## 2019-08-19 NOTE — Telephone Encounter (Signed)
Pt states she was sent in a prescription for nausea today and it would cost her 80.00.  She ask that phenergan be sent in because she usually only pays 1.20 for her prescriptions.  She states she is very sick on her stomach and would like this to be sent in today.  [place]  CB#  [phone]

## 2019-08-19 NOTE — Telephone Encounter (Signed)
sent 

## 2019-08-19 NOTE — Assessment & Plan Note (Signed)
For tylenol prn,  to f/u any worsening symptoms or concerns

## 2019-08-19 NOTE — Assessment & Plan Note (Signed)
Mild situational, cont same tx,  to f/u any worsening symptoms or concerns

## 2019-08-19 NOTE — Patient Instructions (Addendum)
Please take all new medication as prescribed - the zofran for nausea as needed  Please continue all other medications as before, and refills have been done if requested.  Please have the pharmacy call with any other refills you may need.  Please keep your appointments with your specialists as you may have planned

## 2019-08-19 NOTE — Progress Notes (Signed)
Patient ID: Jill Phillips, female   DOB: 10-18-1955, 63 y.o.   MRN: [id number]  Virtual Visit via Video Note  I connected with Jill Phillips on 08/19/19 at 11:20 AM EST by a video enabled telemedicine application and verified that I am speaking with the correct person using two identifiers.  Location: Patient: at home Provider: at office   I discussed the limitations of evaluation and management by telemedicine and the availability of in person appointments. The patient expressed understanding and agreed to proceed.  History of Present Illness: Here with c/o 5 days onset nausea with low grade temp off and non, and mod to severe diffuse HA.  Denies high fever, chills, ST, cough, CP, sob, abd pain, vomiting, constipation or diarrhea.  Pt denies chest pain, increased sob or doe, wheezing, orthopnea, PND, increased LE swelling, palpitations, dizziness or syncope.   Pt denies polydipsia, polyuria,  Does have plan for left shoulder surgury jan 7, then later left knee TKR. Past Medical History:  Diagnosis Date  . Active smoker   . Anxiety   . Calcifying tendinitis of shoulder   . Chronic back pain   . Chronic pain syndrome   . COPD (chronic obstructive pulmonary disease) ([place])   . Dysthymic disorder   . Emphysema lung ([place])   . GERD (gastroesophageal reflux disease)   . Headache    "weekly maybe" (01/31/2016)  . Heart murmur    for years, nothing to be concerned about  . Herpes genitalia   . History of blood transfusion 1980   related to "back surgery"  . Hyperlipidemia   . Hypertension   . Hypothyroidism   . Lumbago   . Osteoarthrosis, unspecified whether generalized or localized, lower leg   . Pain in joint, upper arm   . Pneumothorax, left 01/31/2016   S/P Left posterior subcostal pain injection on 01/30/2016  . PONV (postoperative nausea and vomiting)    gets nauseous  with longer surgery. Difficuty voiding after surgery  . Postlaminectomy syndrome, thoracic region   . Primary  localized osteoarthrosis, lower leg   . Restless leg syndrome   . Sleep apnea    s/p surgery- last sleep study 2011- doesnt use oxygen or machine at night as instructed,.   12/2014- Dr Halford Chessman  reports it is negative.  . Thyroid disease    Past Surgical History:  Procedure Laterality Date  . APPENDECTOMY    . BACK SURGERY     18 back surgeries (2 thoracic & 16 lumbar) (01/31/2016)  . DILATION AND CURETTAGE OF UTERUS    . HAMMER TOE SURGERY    . IR RADIOLOGIST EVAL & MGMT  07/21/2017  . JOINT REPLACEMENT    . KNEE ARTHROSCOPY Right   . LAPAROSCOPIC CHOLECYSTECTOMY    . LUMBAR FUSION N/A 08/01/2015   Procedure: Right sided L1-2 and L2-3 transforaminal lumbar interbody fusion with cages, Extension of posterior fusion T12 to L3, Replaced pedicle screws bilaterally L1-L2 , Replaced left sided pedicle screws L-3. Instrumentation T12 to L3 using local bone graft, Vivigen allograft and cancellous chips;  Surgeon: [name], MD;  Location: [facility];  Service: Orthopedics;  Laterality: N/A;  . LUMBAR LAMINECTOMY/DECOMPRESSION MICRODISCECTOMY N/A 01/28/2014   Procedure: Minimally Invasive Right  L1-2 Microdiscectomy;  Surgeon: [name], MD;  Location: [facility];  Service: Orthopedics;  Laterality: N/A;  . TOTAL HIP ARTHROPLASTY Right   . TOTAL KNEE ARTHROPLASTY  05/29/2012   Procedure: TOTAL KNEE ARTHROPLASTY;  Surgeon: Mcarthur Rossetti, MD;  Location: [facility];  Service: Orthopedics;  Laterality: Right;  Right Total Knee Arthroplasty  . TUBAL LIGATION    . UVULOPALATOPHARYNGOPLASTY    . VAGINAL HYSTERECTOMY      reports that she has been smoking cigarettes. She has a 56.25 pack-year smoking history. She has never used smokeless tobacco. She reports that she does not drink alcohol or use drugs. family history includes Anuerysm (age of onset: 63) in her brother; Heart disease in her brother and father; Heart disease (age of onset: 8) in her sister; Hypertension in her sister; Kidney disease in her  mother. Allergies  Allergen Reactions  . Flagyl [Metronidazole] Diarrhea  . Limonene Itching    Yeast infection in mouth  . Sulfa Antibiotics Other (See Comments)    Yeast infection in mouth  . Doxycycline     Mouth soreness - reaction vs thrush?  [name] Amoxicillin Other (See Comments)    REACTION: Oral yeast infection Has patient had a PCN reaction causing immediate rash, facial/tongue/throat swelling, SOB or lightheadedness with hypotension: No Has patient had a PCN reaction causing severe rash involving mucus membranes or skin necrosis: No Has patient had a PCN reaction that required hospitalization No Has patient had a PCN reaction occurring within the last 10 years: No If all of the above answers are "NO", then may proceed with Cephalosporin use.   . Chlorzoxazone Other (See Comments)    headache  . Codeine Other (See Comments)    headache  . Darvocet [Propoxyphene N-Acetaminophen] Itching  . Dilaudid [Hydromorphone Hcl] Itching  . Keflex [Cephalexin] Other (See Comments)    Pt does not recall reaction (maybe yeast infection)  . Morphine And Related Itching  . Nitrofurantoin Monohyd Macro Hives    Reaction to [employer]  . Percocet [Oxycodone-Acetaminophen] Itching    Patient can tolerate Acetaminophen solely   Current Outpatient Medications on File Prior to Visit  Medication Sig Dispense Refill  . clonazePAM (KLONOPIN) 1 MG tablet TAKE 1 TABLET BY MOUTH 2 TIMES DAILY AS NEEDED FOR ANXIETY 60 tablet 0  . omeprazole (PRILOSEC) 40 MG capsule TAKE 1 CAPSULE BY MOUTH 2 TIMES DAILY BEFORE A MEAL. 180 capsule 3  . albuterol (PROAIR HFA) 108 (90 Base) MCG/ACT inhaler INHALE 1 PUFF INTO THE LUNGS EVERY 6 HOURS AS NEEDED FOR WHEEZING OR SHORTNESS OF BREATH.NEED OFFICE VISIT FOR FURTHER REFILLS 8.5 g 0  . amLODipine (NORVASC) 10 MG tablet TAKE 1 TABLET BY MOUTH DAILY. 90 tablet 1  . aspirin-acetaminophen-caffeine (EXCEDRIN MIGRAINE) 250-250-65 MG tablet Take 1 tablet by mouth every 6 (six)  hours as needed for headache.    [name] atorvastatin (LIPITOR) 20 MG tablet TAKE 1 TABLET BY MOUTH DAILY. 90 tablet 0  . benazepril (LOTENSIN) 40 MG tablet TAKE 1 TABLET BY MOUTH DAILY. 90 tablet 0  . Blood Glucose Monitoring Suppl (ONETOUCH VERIO FLEX SYSTEM) w/Device KIT USE TO TEST BLOOD SUGAR UP TO 4 TIMES A DAY 1 kit 0  . diphenhydrAMINE (BENADRYL) 25 MG tablet Take 25-50 mg by mouth every 6 (six) hours as needed (bladder).    . fentaNYL (DURAGESIC) 50 MCG/HR PLACE 1 PATCH ONTO THE SKIN EVERY 3 DAYS. 10 patch 0  . gabapentin (NEURONTIN) 400 MG capsule Take 1 capsule (400 mg total) by mouth 3 (three) times daily. 270 capsule 1  . HYDROcodone-acetaminophen (NORCO) 10-325 MG tablet TAKE 1 TABLET BY MOUTH EVERY 6 HOURS AS NEEDED. 120 tablet 0  . levothyroxine (SYNTHROID) 88 MCG tablet TAKE 1 TABLET BY MOUTH DAILY. [place]  tablet 1  . lidocaine (LIDODERM) 5 % Place 1 patch onto the skin daily. Remove & Discard patch within 12 hours or as directed by MD 30 patch 0  . Meth-Hyo-M Bl-Na Phos-Ph Sal (URIBEL) 118 MG CAPS Take by mouth 3 (three) times daily.     . methocarbamol (ROBAXIN) 500 MG tablet Take 1 tablet (500 mg total) by mouth 2 (two) times daily. 14 tablet 0  . PROAIR HFA 108 (90 Base) MCG/ACT inhaler INHALE 2 PUFFS INTO THE LUNGS EVERY 6 HOURS AS NEEDED FOR WHEEZING OR SHORTNESS OF BREATH. 8 g 0  . QUEtiapine (SEROQUEL) 100 MG tablet TAKE 1 TABLET BY MOUTH AT BEDTIME. 30 tablet 3  . rOPINIRole (REQUIP) 0.5 MG tablet TAKE 2 TABLETS BY MOUTH AT BEDTIME. 60 tablet 2  . Sennosides (SENNA LAX PO) Take 1 tablet by mouth daily. As directed    . SPIRIVA HANDIHALER 18 MCG inhalation capsule PLACE 1 CAPSULE INTO INHALER AND INHALE ONCE DAILY. 30 capsule 0  . traMADol (ULTRAM) 50 MG tablet TAKE 1 TABLET BY MOUTH EVERY 6 HOURS AS NEEDED. 30 tablet 0  . valACYclovir (VALTREX) 500 MG tablet TAKE 1 TABLET BY MOUTH DAILY. 60 tablet 0  . venlafaxine XR (EFFEXOR-XR) 150 MG 24 hr capsule TAKE 2 CAPSULES BY MOUTH DAILY.  180 capsule 0  . Vitamin D, Ergocalciferol, (DRISDOL) 50000 units CAPS capsule Take 1 capsule (50,000 Units total) by mouth every 7 (seven) days. 8 capsule 0   Current Facility-Administered Medications on File Prior to Visit  Medication Dose Route Frequency Provider Last Rate Last Admin  . calcitonin (salmon) (MIACALCIN/FORTICAL) nasal spray 1 spray  1 spray Alternating Nares Daily [name], MD       Observations/Objective: Alert, NAD, appropriate mood and affect, resps normal, cn 2-12 intact, moves all 4s, no visible rash or swelling Lab Results  Component Value Date   WBC 9.6 12/03/2018   HGB 14.6 12/03/2018   HCT 45.9 12/03/2018   PLT 268 12/03/2018   GLUCOSE 123 (H) 12/03/2018   CHOL 166 11/03/2018   TRIG 158.0 (H) 11/03/2018   HDL 47.60 11/03/2018   LDLDIRECT 120.0 06/04/2018   LDLCALC 86 11/03/2018   ALT 40 12/03/2018   AST 33 12/03/2018   NA 139 12/03/2018   K 3.7 12/03/2018   CL 104 12/03/2018   CREATININE 1.15 (H) 12/03/2018   BUN 20 12/03/2018   CO2 24 12/03/2018   TSH 1.10 11/03/2018   INR 0.87 05/26/2012   HGBA1C 6.3 11/03/2018   Assessment and Plan: See notes Follow Up Instructions: See notes   I discussed the assessment and treatment plan with the patient. The patient was provided an opportunity to ask questions and all were answered. The patient agreed with the plan and demonstrated an understanding of the instructions.   The patient was advised to call back or seek an in-person evaluation if the symptoms worsen or if the condition fails to improve as anticipated.   Cathlean Cower, MD

## 2019-08-19 NOTE — Assessment & Plan Note (Signed)
Without vomiting, but with low grade temp and HA, without other localizing symptoms, etiology unclear, for zofran prn, consider covid testing - declines

## 2019-08-20 ENCOUNTER — Telehealth: Payer: Self-pay | Admitting: Internal Medicine

## 2019-08-20 NOTE — Telephone Encounter (Signed)
Dr. Jenny Reichmann saw her for this yesterday.

## 2019-08-20 NOTE — Telephone Encounter (Signed)
Called patient for clarification. She said that the pain clinic has not received anything from [place] and now will not see her for 30 days. She is worried that she is going to go through withdrawal again and does not want to do that. Patient is being seen at [facility] (Phone # [phone]).  I have called and left messages on two voicemails at the pain clinic to find out what is needed. Waiting on a call back.

## 2019-08-20 NOTE — Telephone Encounter (Signed)
Patient is calling because she spoke to Dr. Jenny Reichmann yesterday regarding submitting a letter to the pain clinic because she was having flu like symptoms, The patient states that the pain clinic did not receive the letter. And now they are thinking that she did not show up for her appointment. Please advise with the patient. [phone]

## 2019-08-23 ENCOUNTER — Other Ambulatory Visit: Payer: Self-pay | Admitting: Internal Medicine

## 2019-08-23 ENCOUNTER — Telehealth: Payer: Self-pay | Admitting: Internal Medicine

## 2019-08-23 MED ORDER — PROMETHAZINE HCL 25 MG PO TABS: 25.0000 mg | ORAL_TABLET | Freq: Three times a day (TID) | ORAL | 0 refills | Status: DC | PRN

## 2019-08-23 NOTE — Telephone Encounter (Signed)
Medication Refill - Medication: promethazine (PHENERGAN) 25 MG    Has the patient contacted their pharmacy? Yes.   (Agent: If no, request that the patient contact the pharmacy for the refill.) (Agent: If yes, when and what did the pharmacy advise?)  Preferred Pharmacy (with phone number or street name):  [facility] [place], [address]  [place] [address]  Phone: [phone] Fax: [phone]  Not a 24 hour pharmacy; exact hours not known.     Agent: Please be advised that RX refills may take up to 3 business days. We ask that you follow-up with your pharmacy.

## 2019-08-23 NOTE — Telephone Encounter (Signed)
Rx sent 

## 2019-08-23 NOTE — Telephone Encounter (Signed)
Pt says that she was seen by Dr. Jenny Reichmann. Pt says that provider was suppose to fax over information to her pain management dr, they are stating that they have not received.  Pt says that she need a refill for her medication that only pain management can fill.    [phone] - to pain management    CB:[phone] -pt's call back

## 2019-08-23 NOTE — Telephone Encounter (Signed)
Called pain clinic to see what was needed. They stated they needed office notes to prove she was seen for being sick. Notes faxed and patient aware.

## 2019-08-23 NOTE — Telephone Encounter (Signed)
I have only been involved in the pt care for one acute visit last wk.  I think ok to forward to PCP, thanks

## 2019-08-23 NOTE — Telephone Encounter (Signed)
Please call her and see what she needs.

## 2019-08-24 ENCOUNTER — Telehealth: Payer: Self-pay

## 2019-08-24 NOTE — Telephone Encounter (Signed)
Copied from [place] [id number]. Topic: General - Other >> Aug 20, 2019  1:27 PM Erick Blinks wrote: Reason for CRM: Pt called requesting Fax [phone] Call back request [phone]

## 2019-08-24 NOTE — Telephone Encounter (Signed)
OV notes sent to pain management

## 2019-08-25 ENCOUNTER — Other Ambulatory Visit: Payer: Self-pay | Admitting: Surgical

## 2019-08-25 ENCOUNTER — Telehealth: Payer: Self-pay | Admitting: Orthopedic Surgery

## 2019-08-25 ENCOUNTER — Telehealth: Payer: Self-pay

## 2019-08-25 DIAGNOSIS — Z79891 Long term (current) use of opiate analgesic: Secondary | ICD-10-CM | POA: Diagnosis not present

## 2019-08-25 DIAGNOSIS — M25511 Pain in right shoulder: Secondary | ICD-10-CM | POA: Diagnosis not present

## 2019-08-25 DIAGNOSIS — M545 Low back pain: Secondary | ICD-10-CM | POA: Diagnosis not present

## 2019-08-25 DIAGNOSIS — G8929 Other chronic pain: Secondary | ICD-10-CM | POA: Diagnosis not present

## 2019-08-25 DIAGNOSIS — G894 Chronic pain syndrome: Secondary | ICD-10-CM | POA: Diagnosis not present

## 2019-08-25 MED ORDER — TRAMADOL HCL 50 MG PO TABS: 50.0000 mg | ORAL_TABLET | Freq: Four times a day (QID) | ORAL | 0 refills | Status: DC | PRN

## 2019-08-25 NOTE — Telephone Encounter (Signed)
Please inform patient

## 2019-08-25 NOTE — Telephone Encounter (Signed)
I did send a referral to them back in July. She then wanted to be referred to [facility] and she was scheduled there. I called and left message at [name] office to see if they ever accepted her. Awaiting their response.

## 2019-08-25 NOTE — Telephone Encounter (Signed)
LVM advising response below.

## 2019-08-25 NOTE — Telephone Encounter (Signed)
[facility] called back and they are unable to take her due to her insurance - they do not accept Medicaid

## 2019-08-25 NOTE — Telephone Encounter (Signed)
Please advise. Thanks.  

## 2019-08-25 NOTE — Telephone Encounter (Signed)
This has been handled by Peyton Najjar., CMA. See other tele encounter.

## 2019-08-25 NOTE — Telephone Encounter (Signed)
Patient called needing Rx refilled Tramadol  Patient uses [facility]  on [address]. The number to contact patient is [phone]

## 2019-08-25 NOTE — Telephone Encounter (Signed)
Copied from [place] [id number]. Topic: General - Other >> Aug 25, 2019 11:22 AM Celene Kras wrote: Reason for CRM: Pt called and is requesting to be transferred over to Dr. Nicholaus Bloom at [address] for her pain clinic. Please advise.

## 2019-08-25 NOTE — Telephone Encounter (Signed)
lori - did we ever send a referral to Dr Hardin Negus?  I can reorder a referral, but just wanted to check with you first.

## 2019-08-25 NOTE — Telephone Encounter (Signed)
Submitted.  This is okay since she has been delayed getting in with pain management

## 2019-09-01 ENCOUNTER — Other Ambulatory Visit: Payer: Self-pay

## 2019-09-01 ENCOUNTER — Other Ambulatory Visit: Payer: Self-pay | Admitting: Internal Medicine

## 2019-09-06 ENCOUNTER — Other Ambulatory Visit (HOSPITAL_COMMUNITY)
Admission: RE | Admit: 2019-09-06 | Discharge: 2019-09-06 | Disposition: A | Discharge status: Home / Self Care | Payer: Medicare Other | Source: Ambulatory Visit | Attending: Orthopedic Surgery | Admitting: Orthopedic Surgery

## 2019-09-06 DIAGNOSIS — M545 Low back pain: Secondary | ICD-10-CM | POA: Diagnosis not present

## 2019-09-06 DIAGNOSIS — Z7951 Long term (current) use of inhaled steroids: Secondary | ICD-10-CM | POA: Diagnosis not present

## 2019-09-06 DIAGNOSIS — Z885 Allergy status to narcotic agent status: Secondary | ICD-10-CM | POA: Diagnosis not present

## 2019-09-06 DIAGNOSIS — Z888 Allergy status to other drugs, medicaments and biological substances status: Secondary | ICD-10-CM | POA: Diagnosis not present

## 2019-09-06 DIAGNOSIS — M1710 Unilateral primary osteoarthritis, unspecified knee: Secondary | ICD-10-CM | POA: Diagnosis not present

## 2019-09-06 DIAGNOSIS — M7500 Adhesive capsulitis of unspecified shoulder: Secondary | ICD-10-CM | POA: Diagnosis not present

## 2019-09-06 DIAGNOSIS — Z8249 Family history of ischemic heart disease and other diseases of the circulatory system: Secondary | ICD-10-CM | POA: Diagnosis not present

## 2019-09-06 DIAGNOSIS — Z20822 Contact with and (suspected) exposure to covid-19: Secondary | ICD-10-CM | POA: Diagnosis not present

## 2019-09-06 DIAGNOSIS — M961 Postlaminectomy syndrome, not elsewhere classified: Secondary | ICD-10-CM | POA: Diagnosis not present

## 2019-09-06 DIAGNOSIS — Z79899 Other long term (current) drug therapy: Secondary | ICD-10-CM | POA: Diagnosis not present

## 2019-09-06 DIAGNOSIS — Z79891 Long term (current) use of opiate analgesic: Secondary | ICD-10-CM | POA: Diagnosis not present

## 2019-09-06 DIAGNOSIS — I10 Essential (primary) hypertension: Secondary | ICD-10-CM | POA: Diagnosis not present

## 2019-09-06 DIAGNOSIS — Z882 Allergy status to sulfonamides status: Secondary | ICD-10-CM | POA: Diagnosis not present

## 2019-09-06 DIAGNOSIS — G894 Chronic pain syndrome: Secondary | ICD-10-CM | POA: Diagnosis not present

## 2019-09-06 DIAGNOSIS — Z881 Allergy status to other antibiotic agents status: Secondary | ICD-10-CM | POA: Diagnosis not present

## 2019-09-06 DIAGNOSIS — K219 Gastro-esophageal reflux disease without esophagitis: Secondary | ICD-10-CM | POA: Diagnosis not present

## 2019-09-06 DIAGNOSIS — E785 Hyperlipidemia, unspecified: Secondary | ICD-10-CM | POA: Diagnosis not present

## 2019-09-06 DIAGNOSIS — G2581 Restless legs syndrome: Secondary | ICD-10-CM | POA: Diagnosis not present

## 2019-09-06 DIAGNOSIS — E039 Hypothyroidism, unspecified: Secondary | ICD-10-CM | POA: Diagnosis not present

## 2019-09-06 DIAGNOSIS — M75102 Unspecified rotator cuff tear or rupture of left shoulder, not specified as traumatic: Secondary | ICD-10-CM | POA: Diagnosis not present

## 2019-09-06 DIAGNOSIS — J439 Emphysema, unspecified: Secondary | ICD-10-CM | POA: Diagnosis not present

## 2019-09-06 DIAGNOSIS — Z841 Family history of disorders of kidney and ureter: Secondary | ICD-10-CM | POA: Diagnosis not present

## 2019-09-06 NOTE — Pre-Procedure Instructions (Signed)
[place], [place] [address] Phone: [phone] Fax: [id number] [place], [address] [address] Phone: [phone] Fax: [phone]  [place], [address] [place] [address] Phone: [phone] Fax: [phone]     Your procedure is scheduled on Thursday January 7th.  Report to [facility] Main Entrance "A" at 10:15 A.M., and check in at the Admitting office.  Call this number if you have problems the morning of surgery:  [phone]  Call [phone] if you have any questions prior to your surgery date Monday-Friday 8am-4pm    Remember:  Do not eat after midnight the night before your surgery  You may drink clear liquids until 9:15 the morning of your surgery.   Clear liquids allowed are: Water, Non-Citrus Juices (without pulp), Carbonated Beverages, Clear Tea, Black Coffee Only, and Gatorade. Your surgeon has ordered for you to have an Ensure Pre-surgery Carbohydrate drink. Finish drinking by 9:15 the morning of your surgery. Drink all in one sitting, do not sip. Patient Instructions  . The night before surgery:  o No food after midnight. ONLY clear liquids after midnight  . The day of surgery (if you do NOT have diabetes):  o Drink ONE (1) Pre-Surgery Clear Ensure as directed.   o This drink was given to you during your hospital  pre-op appointment visit. o The pre-op nurse will instruct you on the time to drink the  Pre-Surgery Ensure depending on your surgery time. o Finish the drink at the designated time by the pre-op nurse.  o Nothing else to drink after completing the  Pre-Surgery Clear Ensure.         If you have questions, please contact your surgeon's office.     Take these medicines the  morning of surgery with A SIP OF WATER  Amlodipine-NORVASC Cimetidine (TAGAMET) Levothyroxine (SYNTHROID) Omeprazole (PRILOSEC) Venlafaxine XR (EFFEXOR-XR) Acetaminophen (TYLENOL)-if needed Albuterol (PROAIR)-if needed Clonazepam (KLONOPIN)-if needed Hydrocodone-acetaminophen (NORCO) if needed Methocarbamol (ROBAXIN) Promethazine (PHENERGAN) if needed Tizanidine (ZANAFLEX) if needed   As of today, STOP taking any Aspirin (unless otherwise instructed by your surgeon), Aleve, Naproxen, Ibuprofen, Motrin, Advil, Goody's, BC's, all herbal medications, fish oil, and all vitamins.    The Morning of Surgery  Do not wear jewelry, make-up or nail polish.  Do not wear lotions, powders, or perfumes/colognes, or deodorant  Do not shave 48 hours prior to surgery.  Men may shave face and neck.  Do not bring valuables to the hospital.  [facility] is not responsible for any belongings or valuables.  If you are a smoker, DO NOT Smoke 24 hours prior to surgery  If you wear a CPAP at night please bring your mask, tubing, and machine the morning of surgery   Remember that you must have someone to transport you home after your surgery, and remain with you for 24 hours if you are discharged the same day.   Please bring cases for contacts, glasses, hearing aids, dentures or bridgework because it cannot be worn into surgery.    Leave your suitcase in the car.  After surgery it may be brought to your room.  For patients admitted to the hospital, discharge time will be determined by your treatment team.  Patients  discharged the day of surgery will not be allowed to drive home.    Special instructions:   [facility]- Preparing For Surgery  Before surgery, you can play an important role. Because skin is not sterile, your skin needs to be as free of germs as possible. You can reduce the number of germs on your skin by washing with CHG (chlorahexidine gluconate) Soap before surgery.  CHG is an  antiseptic cleaner which kills germs and bonds with the skin to continue killing germs even after washing.    Oral Hygiene is also important to reduce your risk of infection.  Remember - BRUSH YOUR TEETH THE MORNING OF SURGERY WITH YOUR REGULAR TOOTHPASTE  Please do not use if you have an allergy to CHG or antibacterial soaps. If your skin becomes reddened/irritated stop using the CHG.  Do not shave (including legs and underarms) for at least 48 hours prior to first CHG shower. It is OK to shave your face.  Please follow these instructions carefully.   1. Shower the NIGHT BEFORE SURGERY and the MORNING OF SURGERY with CHG Soap.   2. If you chose to wash your hair, wash your hair first as usual with your normal shampoo.  3. After you shampoo, rinse your hair and body thoroughly to remove the shampoo.  4. Use CHG as you would any other liquid soap. You can apply CHG directly to the skin and wash gently with a scrungie or a clean washcloth.   5. Apply the CHG Soap to your body ONLY FROM THE NECK DOWN.  Do not use on open wounds or open sores. Avoid contact with your eyes, ears, mouth and genitals (private parts). Wash Face and genitals (private parts)  with your normal soap.   6. Wash thoroughly, paying special attention to the area where your surgery will be performed.  7. Thoroughly rinse your body with warm water from the neck down.  8. DO NOT shower/wash with your normal soap after using and rinsing off the CHG Soap.  9. Pat yourself dry with a CLEAN TOWEL.  10. Wear CLEAN PAJAMAS to bed the night before surgery, wear comfortable clothes the morning of surgery  11. Place CLEAN SHEETS on your bed the night of your first shower and DO NOT SLEEP WITH PETS.    Day of Surgery:  Please shower the morning of surgery with the CHG soap Do not apply any deodorants/lotions. Please wear clean clothes to the hospital/surgery center.   Remember to brush your teeth WITH YOUR REGULAR  TOOTHPASTE.   Please read over the following fact sheets that you were given.

## 2019-09-07 ENCOUNTER — Encounter (HOSPITAL_COMMUNITY): Payer: Self-pay

## 2019-09-07 ENCOUNTER — Encounter (HOSPITAL_COMMUNITY)
Admission: RE | Admit: 2019-09-07 | Discharge: 2019-09-07 | Disposition: A | Discharge status: Home / Self Care | Payer: Medicare Other | Source: Ambulatory Visit | Attending: Orthopedic Surgery | Admitting: Orthopedic Surgery

## 2019-09-07 ENCOUNTER — Other Ambulatory Visit: Payer: Self-pay

## 2019-09-07 DIAGNOSIS — Z01812 Encounter for preprocedural laboratory examination: Secondary | ICD-10-CM | POA: Insufficient documentation

## 2019-09-07 LAB — URINALYSIS, ROUTINE W REFLEX MICROSCOPIC
Bacteria, UA: NONE SEEN
Bilirubin Urine: NEGATIVE
Glucose, UA: NEGATIVE mg/dL
Hgb urine dipstick: NEGATIVE
Ketones, ur: NEGATIVE mg/dL
Nitrite: NEGATIVE
Protein, ur: NEGATIVE mg/dL
Specific Gravity, Urine: 1.03 (ref 1.005–1.030)
pH: 5 (ref 5.0–8.0)

## 2019-09-07 LAB — BASIC METABOLIC PANEL
Anion gap: 13 (ref 5–15)
BUN: 14 mg/dL (ref 8–23)
CO2: 25 mmol/L (ref 22–32)
Calcium: 9.1 mg/dL (ref 8.9–10.3)
Chloride: 104 mmol/L (ref 98–111)
Creatinine, Ser: 1.13 mg/dL — ABNORMAL HIGH (ref 0.44–1.00)
GFR calc Af Amer: 59 mL/min — ABNORMAL LOW (ref >60)
GFR calc non Af Amer: 51 mL/min — ABNORMAL LOW (ref >60)
Glucose, Bld: 112 mg/dL — ABNORMAL HIGH (ref 70–99)
Potassium: 4.2 mmol/L (ref 3.5–5.1)
Sodium: 142 mmol/L (ref 135–145)

## 2019-09-07 LAB — SURGICAL PCR SCREEN
MRSA, PCR: NEGATIVE
Staphylococcus aureus: NEGATIVE

## 2019-09-07 LAB — CBC
HCT: 41.3 % (ref 36.0–46.0)
Hemoglobin: 13.3 g/dL (ref 12.0–15.0)
MCH: 31.4 pg (ref 26.0–34.0)
MCHC: 32.2 g/dL (ref 30.0–36.0)
MCV: 97.4 fL (ref 80.0–100.0)
Platelets: 264 10*3/uL (ref 150–400)
RBC: 4.24 MIL/uL (ref 3.87–5.11)
RDW: 14.6 % (ref 11.5–15.5)
WBC: 7.8 10*3/uL (ref 4.0–10.5)
nRBC: 0 % (ref 0.0–0.2)

## 2019-09-07 LAB — NOVEL CORONAVIRUS, NAA (HOSP ORDER, SEND-OUT TO REF LAB; TAT 18-24 HRS): SARS-CoV-2, NAA: NOT DETECTED

## 2019-09-07 LAB — GLUCOSE, CAPILLARY: Glucose-Capillary: 108 mg/dL — ABNORMAL HIGH (ref 70–99)

## 2019-09-07 MED ORDER — BENZOYL PEROXIDE WASH 5 % EX LIQD: CUTANEOUS | 0 refills | Status: DC

## 2019-09-07 NOTE — Progress Notes (Signed)
PCP - Billey Gosling Cardiologist - denies  Chest x-ray - N/A EKG - 12/03/18 Stress Test - "many years ago" does not remember where this was done  ECHO - denies Cardiac Cath - denies  Sleep Study - 2011 CPAP - does not use   Aspirin Instructions: Patient instructed to hold all Aspirin, NSAID's, herbal medications, fish oil and vitamins 7 days prior to surgery.   Anesthesia review:   Patient denies shortness of breath, fever, cough and chest pain at PAT appointment   Patient verbalized understanding of instructions that were given to them at the PAT appointment. Patient was also instructed that they will need to review over the PAT instructions again at home before surgery.

## 2019-09-07 NOTE — Progress Notes (Signed)
Per Dr Forbes Cellar request rx for Benzoyl peroxide for patient to use for the next 3 days prior to surgery as a wash for her shoulder. Patient requested this to be sent to [facility]. This was submitted.

## 2019-09-08 LAB — URINE CULTURE

## 2019-09-09 ENCOUNTER — Other Ambulatory Visit: Payer: Self-pay

## 2019-09-09 ENCOUNTER — Encounter (HOSPITAL_COMMUNITY)
Admission: RE | Disposition: A | Discharge status: Home / Self Care | Payer: Self-pay | Source: Home / Self Care | Attending: Orthopedic Surgery

## 2019-09-09 ENCOUNTER — Inpatient Hospital Stay (HOSPITAL_COMMUNITY): Payer: Medicare Other | Admitting: Certified Registered"

## 2019-09-09 ENCOUNTER — Inpatient Hospital Stay (HOSPITAL_COMMUNITY)
Admission: RE | Admit: 2019-09-09 | Discharge: 2019-09-10 | DRG: 483 | Disposition: A | Discharge status: Home / Self Care | Payer: Medicare Other | Attending: Orthopedic Surgery | Admitting: Orthopedic Surgery

## 2019-09-09 ENCOUNTER — Inpatient Hospital Stay (HOSPITAL_COMMUNITY): Payer: Medicare Other

## 2019-09-09 ENCOUNTER — Encounter (HOSPITAL_COMMUNITY): Payer: Self-pay | Admitting: Orthopedic Surgery

## 2019-09-09 DIAGNOSIS — M545 Low back pain: Secondary | ICD-10-CM | POA: Diagnosis present

## 2019-09-09 DIAGNOSIS — J439 Emphysema, unspecified: Secondary | ICD-10-CM | POA: Diagnosis not present

## 2019-09-09 DIAGNOSIS — E785 Hyperlipidemia, unspecified: Secondary | ICD-10-CM | POA: Diagnosis present

## 2019-09-09 DIAGNOSIS — M75102 Unspecified rotator cuff tear or rupture of left shoulder, not specified as traumatic: Secondary | ICD-10-CM | POA: Diagnosis not present

## 2019-09-09 DIAGNOSIS — Z882 Allergy status to sulfonamides status: Secondary | ICD-10-CM | POA: Diagnosis not present

## 2019-09-09 DIAGNOSIS — Z9889 Other specified postprocedural states: Secondary | ICD-10-CM

## 2019-09-09 DIAGNOSIS — I10 Essential (primary) hypertension: Secondary | ICD-10-CM | POA: Diagnosis present

## 2019-09-09 DIAGNOSIS — F17210 Nicotine dependence, cigarettes, uncomplicated: Secondary | ICD-10-CM | POA: Diagnosis present

## 2019-09-09 DIAGNOSIS — Z79891 Long term (current) use of opiate analgesic: Secondary | ICD-10-CM | POA: Diagnosis not present

## 2019-09-09 DIAGNOSIS — Z7951 Long term (current) use of inhaled steroids: Secondary | ICD-10-CM

## 2019-09-09 DIAGNOSIS — Z79890 Hormone replacement therapy (postmenopausal): Secondary | ICD-10-CM

## 2019-09-09 DIAGNOSIS — E039 Hypothyroidism, unspecified: Secondary | ICD-10-CM | POA: Diagnosis not present

## 2019-09-09 DIAGNOSIS — G8918 Other acute postprocedural pain: Secondary | ICD-10-CM | POA: Diagnosis not present

## 2019-09-09 DIAGNOSIS — Z841 Family history of disorders of kidney and ureter: Secondary | ICD-10-CM

## 2019-09-09 DIAGNOSIS — Z885 Allergy status to narcotic agent status: Secondary | ICD-10-CM | POA: Diagnosis not present

## 2019-09-09 DIAGNOSIS — Z471 Aftercare following joint replacement surgery: Secondary | ICD-10-CM | POA: Diagnosis not present

## 2019-09-09 DIAGNOSIS — M12812 Other specific arthropathies, not elsewhere classified, left shoulder: Secondary | ICD-10-CM | POA: Diagnosis not present

## 2019-09-09 DIAGNOSIS — M1710 Unilateral primary osteoarthritis, unspecified knee: Secondary | ICD-10-CM | POA: Diagnosis present

## 2019-09-09 DIAGNOSIS — K219 Gastro-esophageal reflux disease without esophagitis: Secondary | ICD-10-CM | POA: Diagnosis present

## 2019-09-09 DIAGNOSIS — G2581 Restless legs syndrome: Secondary | ICD-10-CM | POA: Diagnosis present

## 2019-09-09 DIAGNOSIS — Z888 Allergy status to other drugs, medicaments and biological substances status: Secondary | ICD-10-CM

## 2019-09-09 DIAGNOSIS — G894 Chronic pain syndrome: Secondary | ICD-10-CM | POA: Diagnosis present

## 2019-09-09 DIAGNOSIS — Z881 Allergy status to other antibiotic agents status: Secondary | ICD-10-CM | POA: Diagnosis not present

## 2019-09-09 DIAGNOSIS — Z8249 Family history of ischemic heart disease and other diseases of the circulatory system: Secondary | ICD-10-CM

## 2019-09-09 DIAGNOSIS — Z79899 Other long term (current) drug therapy: Secondary | ICD-10-CM | POA: Diagnosis not present

## 2019-09-09 DIAGNOSIS — Z20822 Contact with and (suspected) exposure to covid-19: Secondary | ICD-10-CM | POA: Diagnosis present

## 2019-09-09 DIAGNOSIS — Z96612 Presence of left artificial shoulder joint: Secondary | ICD-10-CM | POA: Diagnosis not present

## 2019-09-09 DIAGNOSIS — M7500 Adhesive capsulitis of unspecified shoulder: Secondary | ICD-10-CM | POA: Diagnosis present

## 2019-09-09 DIAGNOSIS — J9610 Chronic respiratory failure, unspecified whether with hypoxia or hypercapnia: Secondary | ICD-10-CM | POA: Diagnosis not present

## 2019-09-09 DIAGNOSIS — M961 Postlaminectomy syndrome, not elsewhere classified: Secondary | ICD-10-CM | POA: Diagnosis not present

## 2019-09-09 HISTORY — PX: REVERSE SHOULDER ARTHROPLASTY: SHX5054

## 2019-09-09 SURGERY — ARTHROPLASTY, SHOULDER, TOTAL, REVERSE
Anesthesia: General | Site: Shoulder | Laterality: Left

## 2019-09-09 MED ORDER — METOCLOPRAMIDE HCL 5 MG PO TABS: 5.0000 mg | ORAL_TABLET | Freq: Three times a day (TID) | ORAL | Status: DC | PRN

## 2019-09-09 MED ORDER — BUPIVACAINE HCL (PF) 0.5 % IJ SOLN
INTRAMUSCULAR | Status: DC | PRN
  Administered 2019-09-09: 15 mL via PERINEURAL

## 2019-09-09 MED ORDER — 0.9 % SODIUM CHLORIDE (POUR BTL) OPTIME
TOPICAL | Status: DC | PRN
  Administered 2019-09-09 (×4): 1000 mL

## 2019-09-09 MED ORDER — ONDANSETRON HCL 4 MG PO TABS: 4.0000 mg | ORAL_TABLET | Freq: Four times a day (QID) | ORAL | Status: DC | PRN

## 2019-09-09 MED ORDER — HYDROCODONE-ACETAMINOPHEN 5-325 MG PO TABS
1.0000 | ORAL_TABLET | Freq: Four times a day (QID) | ORAL | Status: DC | PRN
  Administered 2019-09-09: 1 via ORAL
  Administered 2019-09-10: 2 via ORAL
  Administered 2019-09-10: 1 via ORAL
  Filled 2019-09-09 (×2): qty 1
  Filled 2019-09-09 (×2): qty 2

## 2019-09-09 MED ORDER — METOCLOPRAMIDE HCL 5 MG/ML IJ SOLN: 5.0000 mg | Freq: Three times a day (TID) | INTRAMUSCULAR | Status: DC | PRN

## 2019-09-09 MED ORDER — IRRISEPT - 450ML BOTTLE WITH 0.05% CHG IN STERILE WATER, USP 99.95% OPTIME
TOPICAL | Status: DC | PRN
  Administered 2019-09-09: 450 mL via TOPICAL

## 2019-09-09 MED ORDER — ONDANSETRON HCL 4 MG/2ML IJ SOLN: 4.0000 mg | Freq: Once | INTRAMUSCULAR | Status: DC | PRN

## 2019-09-09 MED ORDER — LIDOCAINE 2% (20 MG/ML) 5 ML SYRINGE
INTRAMUSCULAR | Status: DC | PRN
  Administered 2019-09-09: 60 mg via INTRAVENOUS

## 2019-09-09 MED ORDER — KETAMINE HCL 50 MG/5ML IJ SOSY
PREFILLED_SYRINGE | INTRAMUSCULAR | Status: AC
  Filled 2019-09-09: qty 5

## 2019-09-09 MED ORDER — MIDAZOLAM HCL 2 MG/2ML IJ SOLN
INTRAMUSCULAR | Status: AC
  Administered 2019-09-09: 2 mg via INTRAVENOUS
  Filled 2019-09-09: qty 2

## 2019-09-09 MED ORDER — FENTANYL CITRATE (PF) 250 MCG/5ML IJ SOLN
INTRAMUSCULAR | Status: AC
  Filled 2019-09-09: qty 5

## 2019-09-09 MED ORDER — VANCOMYCIN HCL 1000 MG IV SOLR
INTRAVENOUS | Status: AC
  Filled 2019-09-09: qty 1000

## 2019-09-09 MED ORDER — DEXAMETHASONE SODIUM PHOSPHATE 10 MG/ML IJ SOLN
INTRAMUSCULAR | Status: DC | PRN
  Administered 2019-09-09: 10 mg via INTRAVENOUS

## 2019-09-09 MED ORDER — LACTATED RINGERS IV SOLN: INTRAVENOUS | Status: DC

## 2019-09-09 MED ORDER — ASPIRIN 81 MG PO CHEW
81.0000 mg | CHEWABLE_TABLET | Freq: Every day | ORAL | Status: DC
  Administered 2019-09-10: 81 mg via ORAL
  Filled 2019-09-09: qty 1

## 2019-09-09 MED ORDER — NALOXONE HCL 4 MG/0.1ML NA LIQD: 1.0000 | NASAL | Status: DC | PRN

## 2019-09-09 MED ORDER — ROCURONIUM BROMIDE 10 MG/ML (PF) SYRINGE
PREFILLED_SYRINGE | INTRAVENOUS | Status: DC | PRN
  Administered 2019-09-09: 50 mg via INTRAVENOUS

## 2019-09-09 MED ORDER — CLONAZEPAM 1 MG PO TABS
1.0000 mg | ORAL_TABLET | Freq: Two times a day (BID) | ORAL | Status: DC
  Administered 2019-09-09 – 2019-09-10 (×2): 1 mg via ORAL
  Filled 2019-09-09 (×2): qty 1

## 2019-09-09 MED ORDER — MIDAZOLAM HCL 2 MG/2ML IJ SOLN: 2.0000 mg | Freq: Once | INTRAMUSCULAR | Status: AC

## 2019-09-09 MED ORDER — OXYCODONE HCL 5 MG/5ML PO SOLN: 5.0000 mg | Freq: Once | ORAL | Status: DC | PRN

## 2019-09-09 MED ORDER — FENTANYL CITRATE (PF) 100 MCG/2ML IJ SOLN: 25.0000 ug | INTRAMUSCULAR | Status: DC | PRN

## 2019-09-09 MED ORDER — METHOCARBAMOL 500 MG PO TABS
500.0000 mg | ORAL_TABLET | Freq: Four times a day (QID) | ORAL | Status: DC | PRN
  Administered 2019-09-09 – 2019-09-10 (×2): 500 mg via ORAL
  Filled 2019-09-09 (×2): qty 1

## 2019-09-09 MED ORDER — BENAZEPRIL HCL 40 MG PO TABS
40.0000 mg | ORAL_TABLET | Freq: Every day | ORAL | Status: DC
  Administered 2019-09-10: 40 mg via ORAL
  Filled 2019-09-09: qty 2
  Filled 2019-09-09: qty 1

## 2019-09-09 MED ORDER — FENTANYL 25 MCG/HR TD PT72
1.0000 | MEDICATED_PATCH | TRANSDERMAL | Status: DC
  Administered 2019-09-09: 1 via TRANSDERMAL
  Filled 2019-09-09: qty 1

## 2019-09-09 MED ORDER — AMLODIPINE BESYLATE 10 MG PO TABS
10.0000 mg | ORAL_TABLET | Freq: Every day | ORAL | Status: DC
  Administered 2019-09-10: 10:00:00 10 mg via ORAL
  Filled 2019-09-09: qty 1

## 2019-09-09 MED ORDER — DEXMEDETOMIDINE HCL IN NACL 80 MCG/20ML IV SOLN
INTRAVENOUS | Status: AC
  Filled 2019-09-09: qty 20

## 2019-09-09 MED ORDER — PHENYLEPHRINE 40 MCG/ML (10ML) SYRINGE FOR IV PUSH (FOR BLOOD PRESSURE SUPPORT)
PREFILLED_SYRINGE | INTRAVENOUS | Status: DC | PRN
  Administered 2019-09-09 (×2): 160 ug via INTRAVENOUS
  Administered 2019-09-09: 80 ug via INTRAVENOUS

## 2019-09-09 MED ORDER — METHOCARBAMOL 1000 MG/10ML IJ SOLN
500.0000 mg | Freq: Four times a day (QID) | INTRAVENOUS | Status: DC | PRN
  Filled 2019-09-09: qty 5

## 2019-09-09 MED ORDER — PHENOL 1.4 % MT LIQD: 1.0000 | OROMUCOSAL | Status: DC | PRN

## 2019-09-09 MED ORDER — DIPHENHYDRAMINE HCL 50 MG/ML IJ SOLN
INTRAMUSCULAR | Status: DC | PRN
  Administered 2019-09-09: 12.5 mg via INTRAVENOUS

## 2019-09-09 MED ORDER — ALBUTEROL SULFATE (2.5 MG/3ML) 0.083% IN NEBU: 3.0000 mL | INHALATION_SOLUTION | Freq: Four times a day (QID) | RESPIRATORY_TRACT | Status: DC | PRN

## 2019-09-09 MED ORDER — ONDANSETRON HCL 4 MG/2ML IJ SOLN: 4.0000 mg | Freq: Four times a day (QID) | INTRAMUSCULAR | Status: DC | PRN

## 2019-09-09 MED ORDER — VENLAFAXINE HCL ER 150 MG PO CP24
300.0000 mg | ORAL_CAPSULE | Freq: Every day | ORAL | Status: DC
  Administered 2019-09-10: 10:00:00 300 mg via ORAL
  Filled 2019-09-09: qty 2

## 2019-09-09 MED ORDER — VANCOMYCIN HCL 1000 MG IV SOLR
INTRAVENOUS | Status: DC | PRN
  Administered 2019-09-09: 1000 mg

## 2019-09-09 MED ORDER — FENTANYL CITRATE (PF) 100 MCG/2ML IJ SOLN: 100.0000 ug | Freq: Once | INTRAMUSCULAR | Status: AC

## 2019-09-09 MED ORDER — FENTANYL CITRATE (PF) 100 MCG/2ML IJ SOLN
INTRAMUSCULAR | Status: AC
  Administered 2019-09-09: 100 ug via INTRAVENOUS
  Filled 2019-09-09: qty 2

## 2019-09-09 MED ORDER — MIDAZOLAM HCL 2 MG/2ML IJ SOLN
INTRAMUSCULAR | Status: AC
  Filled 2019-09-09: qty 2

## 2019-09-09 MED ORDER — MENTHOL 3 MG MT LOZG: 1.0000 | LOZENGE | OROMUCOSAL | Status: DC | PRN

## 2019-09-09 MED ORDER — BUPIVACAINE LIPOSOME 1.3 % IJ SUSP
INTRAMUSCULAR | Status: DC | PRN
  Administered 2019-09-09: 10 mL

## 2019-09-09 MED ORDER — TRANEXAMIC ACID-NACL 1000-0.7 MG/100ML-% IV SOLN
1000.0000 mg | INTRAVENOUS | Status: AC
  Administered 2019-09-09: 1000 mg via INTRAVENOUS
  Filled 2019-09-09: qty 100

## 2019-09-09 MED ORDER — PHENYLEPHRINE HCL-NACL 10-0.9 MG/250ML-% IV SOLN
INTRAVENOUS | Status: DC | PRN
  Administered 2019-09-09: 40 ug/min via INTRAVENOUS
  Administered 2019-09-09: 50 ug/min via INTRAVENOUS

## 2019-09-09 MED ORDER — CHLORHEXIDINE GLUCONATE 4 % EX LIQD: 60.0000 mL | Freq: Once | CUTANEOUS | Status: DC

## 2019-09-09 MED ORDER — LEVOTHYROXINE SODIUM 88 MCG PO TABS
88.0000 ug | ORAL_TABLET | Freq: Every day | ORAL | Status: DC
  Administered 2019-09-10: 88 ug via ORAL
  Filled 2019-09-09: qty 1

## 2019-09-09 MED ORDER — VANCOMYCIN HCL IN DEXTROSE 1-5 GM/200ML-% IV SOLN
1000.0000 mg | INTRAVENOUS | Status: AC
  Administered 2019-09-09: 1000 mg via INTRAVENOUS
  Filled 2019-09-09: qty 200

## 2019-09-09 MED ORDER — OXYCODONE HCL 5 MG PO TABS: 5.0000 mg | ORAL_TABLET | Freq: Once | ORAL | Status: DC | PRN

## 2019-09-09 MED ORDER — ROPINIROLE HCL 0.5 MG PO TABS: 0.5000 mg | ORAL_TABLET | Freq: Every evening | ORAL | Status: DC

## 2019-09-09 MED ORDER — ATORVASTATIN CALCIUM 10 MG PO TABS
20.0000 mg | ORAL_TABLET | Freq: Every evening | ORAL | Status: DC
  Administered 2019-09-09: 18:00:00 20 mg via ORAL
  Filled 2019-09-09: qty 2

## 2019-09-09 MED ORDER — VANCOMYCIN HCL IN DEXTROSE 1-5 GM/200ML-% IV SOLN
1000.0000 mg | Freq: Two times a day (BID) | INTRAVENOUS | Status: AC
  Administered 2019-09-09 – 2019-09-10 (×2): 1000 mg via INTRAVENOUS
  Filled 2019-09-09 (×2): qty 200

## 2019-09-09 MED ORDER — PROPOFOL 10 MG/ML IV BOLUS
INTRAVENOUS | Status: DC | PRN
  Administered 2019-09-09: 150 mg via INTRAVENOUS

## 2019-09-09 MED ORDER — DOCUSATE SODIUM 100 MG PO CAPS
100.0000 mg | ORAL_CAPSULE | Freq: Two times a day (BID) | ORAL | Status: DC
  Administered 2019-09-09 – 2019-09-10 (×2): 100 mg via ORAL
  Filled 2019-09-09 (×2): qty 1

## 2019-09-09 SURGICAL SUPPLY — 80 items
ALCOHOL 70% 16 OZ (MISCELLANEOUS) ×2 IMPLANT
BASEPLATE GLENOSPHERE 25 (Plate) ×2 IMPLANT
BEARING HUMERAL SHLDER 36M STD (Shoulder) ×1 IMPLANT
BIT DRILL 2.7 W/STOP DISP (BIT) ×2 IMPLANT
BIT DRILL QUICK REL 1/8 2PK SL (DRILL) ×2 IMPLANT
BIT DRILL TWIST 2.7 (BIT) ×2 IMPLANT
BLADE SAW SGTL 13X75X1.27 (BLADE) ×2 IMPLANT
CHLORAPREP W/TINT 26 (MISCELLANEOUS) ×2 IMPLANT
CLSR STERI-STRIP ANTIMIC 1/2X4 (GAUZE/BANDAGES/DRESSINGS) ×2 IMPLANT
COVER SURGICAL LIGHT HANDLE (MISCELLANEOUS) ×2 IMPLANT
COVER WAND RF STERILE (DRAPES) ×2 IMPLANT
DRAPE INCISE IOBAN 66X45 STRL (DRAPES) ×2 IMPLANT
DRAPE U-SHAPE 47X51 STRL (DRAPES) ×4 IMPLANT
DRILL QUICK RELEASE 1/8 INCH (DRILL) ×2
DRSG AQUACEL AG ADV 3.5X10 (GAUZE/BANDAGES/DRESSINGS) ×2 IMPLANT
ELECT BLADE 4.0 EZ CLEAN MEGAD (MISCELLANEOUS) ×2
ELECT REM PT RETURN 9FT ADLT (ELECTROSURGICAL) ×2
ELECTRODE BLDE 4.0 EZ CLN MEGD (MISCELLANEOUS) ×1 IMPLANT
ELECTRODE REM PT RTRN 9FT ADLT (ELECTROSURGICAL) ×1 IMPLANT
GAUZE SPONGE 4X4 12PLY STRL LF (GAUZE/BANDAGES/DRESSINGS) ×2 IMPLANT
GLENOID SPHERE 36MM CVD +3 (Orthopedic Implant) ×2 IMPLANT
GLOVE BIOGEL PI IND STRL 7.0 (GLOVE) ×1 IMPLANT
GLOVE BIOGEL PI IND STRL 8 (GLOVE) ×1 IMPLANT
GLOVE BIOGEL PI INDICATOR 7.0 (GLOVE) ×1
GLOVE BIOGEL PI INDICATOR 8 (GLOVE) ×1
GLOVE ECLIPSE 7.0 STRL STRAW (GLOVE) ×2 IMPLANT
GLOVE ECLIPSE 8.0 STRL XLNG CF (GLOVE) ×2 IMPLANT
GOWN STRL REUS W/ TWL LRG LVL3 (GOWN DISPOSABLE) ×2 IMPLANT
GOWN STRL REUS W/ TWL XL LVL3 (GOWN DISPOSABLE) IMPLANT
GOWN STRL REUS W/TWL LRG LVL3 (GOWN DISPOSABLE) ×2
GOWN STRL REUS W/TWL XL LVL3 (GOWN DISPOSABLE)
GUIDE MODEL REV SHLD LT (ORTHOPEDIC DISPOSABLE SUPPLIES) ×2 IMPLANT
HYDROGEN PEROXIDE 16OZ (MISCELLANEOUS) ×2 IMPLANT
JET LAVAGE IRRISEPT WOUND (IRRIGATION / IRRIGATOR) ×2
KIT BASIN OR (CUSTOM PROCEDURE TRAY) ×2 IMPLANT
KIT TURNOVER KIT B (KITS) ×2 IMPLANT
LAVAGE JET IRRISEPT WOUND (IRRIGATION / IRRIGATOR) ×1 IMPLANT
LOOP VESSEL MAXI BLUE (MISCELLANEOUS) ×2 IMPLANT
MANIFOLD NEPTUNE II (INSTRUMENTS) ×2 IMPLANT
NDL SUT 6 .5 CRC .975X.05 MAYO (NEEDLE) IMPLANT
NEEDLE MAYO TAPER (NEEDLE)
NEEDLE TAPERED W/ NITINOL LOOP (MISCELLANEOUS) ×2 IMPLANT
NS IRRIG 1000ML POUR BTL (IV SOLUTION) ×8 IMPLANT
PACK SHOULDER (CUSTOM PROCEDURE TRAY) ×2 IMPLANT
PAD ARMBOARD 7.5X6 YLW CONV (MISCELLANEOUS) ×4 IMPLANT
PASSER SUT SWANSON 36MM LOOP (INSTRUMENTS) ×2 IMPLANT
PIN THREADED REVERSE (PIN) ×4 IMPLANT
REAMER GUIDE BUSHING SURG DISP (MISCELLANEOUS) ×2 IMPLANT
REAMER GUIDE W/SCREW AUG (MISCELLANEOUS) ×2 IMPLANT
RESTRAINT HEAD UNIVERSAL NS (MISCELLANEOUS) ×2 IMPLANT
SCREW BONE LOCKING 4.75X30X3.5 (Screw) ×2 IMPLANT
SCREW BONE STRL 6.5MMX25MM (Screw) ×2 IMPLANT
SCREW BONE STRL 6.5MMX30MM (Screw) ×2 IMPLANT
SCREW LOCKING 4.75MMX15MM (Screw) ×6 IMPLANT
SHOULDER HUMERAL BEAR 36M STD (Shoulder) ×2 IMPLANT
SLING ARM IMMOBILIZER LRG (SOFTGOODS) ×2 IMPLANT
SOL PREP POV-IOD 4OZ 10% (MISCELLANEOUS) ×2 IMPLANT
SPONGE LAP 18X18 RF (DISPOSABLE) ×2 IMPLANT
STEM HUMERAL STRL 9MMX83MM (Stem) ×2 IMPLANT
STRIP CLOSURE SKIN 1/2X4 (GAUZE/BANDAGES/DRESSINGS) ×2 IMPLANT
SUCTION FRAZIER HANDLE 10FR (MISCELLANEOUS) ×1
SUCTION TUBE FRAZIER 10FR DISP (MISCELLANEOUS) ×1 IMPLANT
SUT BROADBAND TAPE 2PK 1.5 (SUTURE) ×4 IMPLANT
SUT FIBERWIRE #2 38 T-5 BLUE (SUTURE)
SUT MAXBRAID (SUTURE) IMPLANT
SUT MNCRL AB 3-0 PS2 18 (SUTURE) ×4 IMPLANT
SUT SILK 2 0 TIES 10X30 (SUTURE) ×2 IMPLANT
SUT VIC AB 0 CT1 27 (SUTURE) ×4
SUT VIC AB 0 CT1 27XBRD ANBCTR (SUTURE) ×4 IMPLANT
SUT VIC AB 1 CT1 27 (SUTURE) ×3
SUT VIC AB 1 CT1 27XBRD ANBCTR (SUTURE) ×3 IMPLANT
SUT VIC AB 2-0 CT1 27 (SUTURE) ×3
SUT VIC AB 2-0 CT1 TAPERPNT 27 (SUTURE) ×3 IMPLANT
SUT VICRYL 0 UR6 27IN ABS (SUTURE) ×4 IMPLANT
SUTURE FIBERWR #2 38 T-5 BLUE (SUTURE) IMPLANT
TOWEL GREEN STERILE (TOWEL DISPOSABLE) ×2 IMPLANT
TRAY FOL W/BAG SLVR 16FR STRL (SET/KITS/TRAYS/PACK) IMPLANT
TRAY FOLEY W/BAG SLVR 16FR LF (SET/KITS/TRAYS/PACK)
TRAY HUM MINI SHOULDER +3 40 (Joint) ×2 IMPLANT
WATER STERILE IRR 1000ML POUR (IV SOLUTION) ×2 IMPLANT

## 2019-09-09 NOTE — Op Note (Signed)
NAME: KSENIA, KUNZ MEDICAL RECORD YP:[id number] ACCOUNT [id number] DATE OF BIRTH:[birth year] FACILITY: [facility] LOCATION: [facility] PHYSICIAN:Mikiah Durall Randel Pigg, MD  OPERATIVE REPORT  DATE OF PROCEDURE:  09/09/2019  PREOPERATIVE DIAGNOSIS:  Left shoulder rotator cuff arthropathy.  POSTOPERATIVE DIAGNOSIS:  Left shoulder rotator cuff arthropathy.  PROCEDURE:  Left shoulder reverse shoulder replacement using [employer] patient-specific implants, 36+3 mm glenosphere with mini baseplate, 4 locking screws, 1 central compression screw, mini humeral stem size 9 x 83 mm with highly cross-linked polyethylene  standard bearing, 36 mm in diameter and mini humeral tray, standard thickness, +3 taper offset, 40 mm diameter.  SURGEON:  Meredith Pel, MD  ASSISTANT:  Annie Main PA.  INDICATIONS:  The patient is a 64 year old patient with end-stage left shoulder rotator cuff arthropathy with pain refractory to nonoperative management, who presents for operative management after explanation of risks and benefits.  PROCEDURE IN DETAIL:  The patient was brought to the operating room where general anesthetic was induced.  Preoperative antibiotics administered.  Timeout was called.  The patient was placed in the beach chair position with the head in neutral position.   Left shoulder was then prescrubbed with hydrogen peroxide, then alcohol and Betadine, which was allowed to air dry, then prepped with ChloraPrep solution and draped in a sterile manner with Ioban, covering the entire operative field.  Timeout was  called.  Deltopectoral approach was made.  Skin and subcutaneous tissue were sharply divided.  Cephalic vein mobilized medially.  Subdeltoid and subacromial adhesions were released.  Biceps tendon was tenodesed to the pectoralis tendon using 0 Vicryl  suture.  The circumflex vessels were ligated using silk suture.  The axillary nerve was identified and a vessel loop placed around it.  It was protected at all  times during the remaining portion of the case.  At this time, the capsule was detached and  the subscapularis was torn about 60% through.  The supraspinatus and infraspinatus were torn and there were arthritic changes on the superior aspect of the humeral head.  The capsule was released about 2 cm off the inferior humeral neck and then around  to the 7 o'clock position.  Head was dislocated.  Head was then cut in 30 degrees of retroversion at the junction of the tuberosity and articular margin.  The stem was placed.  We broached up to a size 9.  A cap was placed and then attention was directed  towards the glenoid.  Posterior retractor was placed.  Circumferential labral release was performed.  Care was taken to avoid injury to the axillary nerve, which was protected.  The patient-specific guide was placed and reaming was performed.  We did  get good bleeding bony surfaces around about 80% of the implant.  Next, the reaming was performed and a standard baseplate was applied in approximately 7-8 degrees of inferior tilt.  This was in accordance with the preoperative templated plan.  One  central compression screw and 4 peripheral locking screws were placed.  The +3 glenosphere was then placed with good fit obtained.  This gave good coverage circumferentially as well as inferiorly.  Attention was then directed towards the humerus.  Trial  reduction was performed and the best fit was achieved with the +3 taper offset humeral tray with standard 36 bearing polyethylene.  With that trial in place, the patient had excellent forward flexion, external rotation, internal rotation as well as good  stability with adduction, extension and anterior force.  The head was dislocated and the true  stem was placed with excellent purchase obtained.  Subscapularis was repaired through drill holes using MaxBraid suture.  Nice knots were applied.  Thorough  irrigation was performed.  Same stability was maintained.  At this time,  vancomycin powder was placed within both the joint capsule as well as the supradeltoid layer and skin layer.  The subscapularis was repaired with the arm in 45 degrees of external  rotation using the MaxBraid sutures.  Thorough irrigation again performed and the incision was then closed using #1 Vicryl suture to reapproximate the deltopectoral approach, followed by 0 Vicryl suture and 2-0 Vicryl suture and a 3-0 Monocryl.   Steri-Strips and Aquacel dressing applied.  The patient tolerated the procedure well without immediate complications.  She was transferred to the recovery room in stable condition.  Luke's assistance was required at all times during the case for  retraction, as well as mobilization of tissues, opening and closing.  His assistance was a medical necessity.  VN/NUANCE  D:09/09/2019 T:09/09/2019 JOB:[id number]

## 2019-09-09 NOTE — Anesthesia Preprocedure Evaluation (Addendum)
Anesthesia Evaluation  Patient identified by MRN, date of birth, ID band Patient awake    Reviewed: Allergy & Precautions, NPO status , Patient's Chart, lab work & pertinent test results  History of Anesthesia Complications Negative for: history of anesthetic complications  Airway Mallampati: II  TM Distance: >3 FB Neck ROM: Full    Dental   Pulmonary sleep apnea , COPD, Current Smoker,    Pulmonary exam normal        Cardiovascular hypertension, Normal cardiovascular exam     Neuro/Psych PSYCHIATRIC DISORDERS Anxiety Depression negative neurological ROS     GI/Hepatic GERD  ,(+)     substance abuse  ,   Endo/Other  diabetesHypothyroidism   Renal/GU Renal InsufficiencyRenal disease  negative genitourinary   Musculoskeletal  (+) Arthritis , narcotic dependent  Abdominal   Peds  Hematology negative hematology ROS (+)   Anesthesia Other Findings   Reproductive/Obstetrics                            Anesthesia Physical Anesthesia Plan  ASA: III  Anesthesia Plan: General   Post-op Pain Management: GA combined w/ Regional for post-op pain   Induction: Intravenous  PONV Risk Score and Plan: 2 and Ondansetron, Dexamethasone, Treatment may vary due to age or medical condition and Midazolam  Airway Management Planned: Oral ETT  Additional Equipment: None  Intra-op Plan:   Post-operative Plan: Extubation in OR  Informed Consent: I have reviewed the patients History and Physical, chart, labs and discussed the procedure including the risks, benefits and alternatives for the proposed anesthesia with the patient or authorized representative who has indicated his/her understanding and acceptance.     Dental advisory given  Plan Discussed with:   Anesthesia Plan Comments:        Anesthesia Quick Evaluation

## 2019-09-09 NOTE — Brief Op Note (Signed)
   09/09/2019  3:36 PM  PATIENT:  Jill Phillips  64 y.o. female  PRE-OPERATIVE DIAGNOSIS:  left shoulder rotator cuff arthropathy  POST-OPERATIVE DIAGNOSIS:  left shoulder rotator cuff arthropathy  PROCEDURE:  Procedure(s): LEFT REVERSE SHOULDER REPLACEMENT  SURGEON:  Surgeon(s): Meredith Pel, MD  ASSISTANT: magnant pa  ANESTHESIA:   general  EBL: 100 ml    No intake/output data recorded.  BLOOD ADMINISTERED: none  DRAINS: none   LOCAL MEDICATIONS USED:  none  SPECIMEN:  No Specimen  COUNTS:  YES  TOURNIQUET:  * No tourniquets in log *  DICTATION: .Other Dictation: Dictation Number [id number]  PLAN OF CARE: Admit for overnight observation  PATIENT DISPOSITION:  PACU - hemodynamically stable

## 2019-09-09 NOTE — Plan of Care (Signed)

## 2019-09-09 NOTE — H&P (Signed)
Jill Phillips is an 64 y.o. female.   Chief Complaint: Left shoulder pain HPI: Jill Phillips is a 64 year old patient with left shoulder pain.  She had a fall over a year ago.  She reports pain and weakness in the left shoulder.  MRI scanning shows retracted tears of both the supraspinatus and subscapularis.  She has failed conservative measures.  Presents now for operative management after explanation of risks and benefits.  She reports both pain as well as functional weakness.  Not a great candidate for tendon transfer due to smoking history and subscapularis involvement.  Past Medical History:  Diagnosis Date  . Active smoker   . Anxiety   . Calcifying tendinitis of shoulder   . Chronic back pain   . Chronic pain syndrome   . COPD (chronic obstructive pulmonary disease) ([place])   . Dysthymic disorder   . Emphysema lung ([place])   . GERD (gastroesophageal reflux disease)   . Headache    "weekly maybe" (01/31/2016)  . Heart murmur    for years, nothing to be concerned about  . Herpes genitalia   . History of blood transfusion 1980   related to "back surgery"  . Hyperlipidemia   . Hypertension   . Hypothyroidism   . Lumbago   . Osteoarthrosis, unspecified whether generalized or localized, lower leg   . Pain in joint, upper arm   . Pneumothorax, left 01/31/2016   S/P Left posterior subcostal pain injection on 01/30/2016  . PONV (postoperative nausea and vomiting)    gets nauseous  with longer surgery. Difficuty voiding after surgery  . Postlaminectomy syndrome, thoracic region   . Primary localized osteoarthrosis, lower leg   . Restless leg syndrome   . Sleep apnea    s/p surgery- last sleep study 2011- doesnt use oxygen or machine at night as instructed,.   12/2014- Dr Halford Chessman  reports it is negative.  . Thyroid disease     Past Surgical History:  Procedure Laterality Date  . APPENDECTOMY    . BACK SURGERY     18 back surgeries (2 thoracic & 16 lumbar) (01/31/2016)  . DILATION AND CURETTAGE  OF UTERUS    . HAMMER TOE SURGERY    . IR RADIOLOGIST EVAL & MGMT  07/21/2017  . JOINT REPLACEMENT    . KNEE ARTHROSCOPY Right   . LAPAROSCOPIC CHOLECYSTECTOMY    . LUMBAR FUSION N/A 08/01/2015   Procedure: Right sided L1-2 and L2-3 transforaminal lumbar interbody fusion with cages, Extension of posterior fusion T12 to L3, Replaced pedicle screws bilaterally L1-L2 , Replaced left sided pedicle screws L-3. Instrumentation T12 to L3 using local bone graft, Vivigen allograft and cancellous chips;  Surgeon: [name], MD;  Location: [facility];  Service: Orthopedics;  Laterality: N/A;  . LUMBAR LAMINECTOMY/DECOMPRESSION MICRODISCECTOMY N/A 01/28/2014   Procedure: Minimally Invasive Right  L1-2 Microdiscectomy;  Surgeon: [name], MD;  Location: [facility];  Service: Orthopedics;  Laterality: N/A;  . TOTAL HIP ARTHROPLASTY Right   . TOTAL KNEE ARTHROPLASTY  05/29/2012   Procedure: TOTAL KNEE ARTHROPLASTY;  Surgeon: Mcarthur Rossetti, MD;  Location: [facility];  Service: Orthopedics;  Laterality: Right;  Right Total Knee Arthroplasty  . TUBAL LIGATION    . UVULOPALATOPHARYNGOPLASTY    . VAGINAL HYSTERECTOMY      Family History  Problem Relation Age of Onset  . Kidney disease Mother   . Heart disease Father   . Anuerysm Brother 29       brain  .  Heart disease Brother   . Heart disease Sister 39       s/p CABG  . Hypertension Sister   . Colon cancer Neg Hx    Social History:  reports that she has been smoking cigarettes. She has a 56.25 pack-year smoking history. She has never used smokeless tobacco. She reports that she does not drink alcohol or use drugs.  Allergies:  Allergies  Allergen Reactions  . Flagyl [Metronidazole] Diarrhea  . Limonene Itching    Yeast infection in mouth  . Sulfa Antibiotics Other (See Comments)    Yeast infection in mouth  . Doxycycline     Mouth soreness - reaction vs thrush?  [name] Amoxicillin Other (See Comments)    REACTION: Oral yeast infection Has  patient had a PCN reaction causing immediate rash, facial/tongue/throat swelling, SOB or lightheadedness with hypotension: No Has patient had a PCN reaction causing severe rash involving mucus membranes or skin necrosis: No Has patient had a PCN reaction that required hospitalization No Has patient had a PCN reaction occurring within the last 10 years: No If all of the above answers are "NO", then may proceed with Cephalosporin use.   . Chlorzoxazone Other (See Comments)    headache  . Codeine Other (See Comments)    headache  . Darvocet [Propoxyphene N-Acetaminophen] Itching  . Dilaudid [Hydromorphone Hcl] Itching  . Keflex [Cephalexin] Other (See Comments)    Pt does not recall reaction (maybe yeast infection)  . Morphine And Related Itching  . Nitrofurantoin Monohyd Macro Hives    Reaction to [employer]  . Percocet [Oxycodone-Acetaminophen] Itching    Patient can tolerate Acetaminophen solely    Facility-Administered Medications Prior to Admission  Medication Dose Route Frequency Provider Last Rate Last Admin  . calcitonin (salmon) (MIACALCIN/FORTICAL) nasal spray 1 spray  1 spray Alternating Nares Daily [name], MD       Medications Prior to Admission  Medication Sig Dispense Refill  . acetaminophen (TYLENOL) 500 MG tablet Take 1,000 mg by mouth every 6 (six) hours as needed (for headaches.).    [name] albuterol (PROAIR HFA) 108 (90 Base) MCG/ACT inhaler INHALE 1 PUFF INTO THE LUNGS EVERY 6 HOURS AS NEEDED FOR WHEEZING OR SHORTNESS OF BREATH.NEED OFFICE VISIT FOR FURTHER REFILLS (Patient taking differently: Inhale 1 puff into the lungs every 6 (six) hours as needed for wheezing or shortness of breath. ) 8.5 g 0  . amLODipine (NORVASC) 10 MG tablet TAKE 1 TABLET BY MOUTH DAILY. (Patient taking differently: Take 10 mg by mouth daily. ) 90 tablet 1  . atorvastatin (LIPITOR) 20 MG tablet TAKE 1 TABLET BY MOUTH DAILY. (Patient taking differently: Take 20 mg by mouth every evening. ) 90  tablet 0  . benazepril (LOTENSIN) 40 MG tablet TAKE 1 TABLET BY MOUTH DAILY. (Patient taking differently: Take 40 mg by mouth daily. ) 90 tablet 0  . benzoyl peroxide (BENZOYL PEROXIDE) 5 % external liquid Wash affected shoulder daily with wash for 3 days prior to surgery 142 g 0  . Cholecalciferol (VITAMIN D3 SUPER STRENGTH) 50 MCG (2000 UT) TABS Take 2,000 Units by mouth daily.    . cimetidine (TAGAMET) 400 MG tablet Take 400 mg by mouth 2 (two) times daily.    . clonazePAM (KLONOPIN) 1 MG tablet TAKE 1 TABLET BY MOUTH 2 TIMES DAILY AS NEEDED FOR ANXIETY (Patient taking differently: Take 1 mg by mouth 2 (two) times daily. ) 60 tablet 0  . docusate sodium (COLACE) 100 MG capsule Take  200 mg by mouth at bedtime.    . fentaNYL (DURAGESIC) 25 MCG/HR Place 1 patch onto the skin every 3 (three) days.    [name] gabapentin (NEURONTIN) 400 MG capsule Take 1 capsule (400 mg total) by mouth 3 (three) times daily. (Patient taking differently: Take 800 mg by mouth at bedtime. ) 270 capsule 1  . HYDROcodone-acetaminophen (NORCO) 10-325 MG tablet TAKE 1 TABLET BY MOUTH EVERY 6 HOURS AS NEEDED. (Patient taking differently: Take 1 tablet by mouth every 6 (six) hours. ) 120 tablet 0  . levothyroxine (SYNTHROID) 88 MCG tablet TAKE 1 TABLET BY MOUTH DAILY. 90 tablet 1  . NARCAN 4 MG/0.1ML LIQD nasal spray kit Place 1 spray into the nose as needed (accidental overdose).     [name] omeprazole (PRILOSEC) 40 MG capsule TAKE 1 CAPSULE BY MOUTH 2 TIMES DAILY BEFORE A MEAL. (Patient taking differently: Take 40 mg by mouth 2 (two) times daily. ) 180 capsule 3  . promethazine (PHENERGAN) 25 MG tablet Take 1 tablet (25 mg total) by mouth every 8 (eight) hours as needed for nausea or vomiting. 20 tablet 0  . QUEtiapine (SEROQUEL) 100 MG tablet TAKE 1 TABLET BY MOUTH AT BEDTIME. (Patient taking differently: Take 100 mg by mouth at bedtime. ) 30 tablet 3  . rOPINIRole (REQUIP) 0.5 MG tablet TAKE 2 TABLETS BY MOUTH AT BEDTIME. (Patient taking  differently: Take 0.5 mg by mouth every evening. ) 60 tablet 2  . Sennosides (SENNA LAX PO) Take 2 tablets by mouth at bedtime.     [name] tiZANidine (ZANAFLEX) 4 MG tablet Take 4 mg by mouth 3 (three) times daily.    . valACYclovir (VALTREX) 500 MG tablet TAKE 1 TABLET BY MOUTH DAILY. (Patient taking differently: Take 500 mg by mouth at bedtime. ) 60 tablet 0  . venlafaxine XR (EFFEXOR-XR) 150 MG 24 hr capsule TAKE 2 CAPSULES BY MOUTH DAILY. (Patient taking differently: Take 300 mg by mouth daily. ) 180 capsule 0  . Blood Glucose Monitoring Suppl (ONETOUCH VERIO FLEX SYSTEM) w/Device KIT USE TO TEST BLOOD SUGAR UP TO 4 TIMES A DAY 1 kit 0  . fentaNYL (DURAGESIC) 50 MCG/HR PLACE 1 PATCH ONTO THE SKIN EVERY 3 DAYS. 10 patch 0  . lidocaine (LIDODERM) 5 % Place 1 patch onto the skin daily. Remove & Discard patch within 12 hours or as directed by MD (Patient not taking: Reported on 09/01/2019) 30 patch 0  . methocarbamol (ROBAXIN) 500 MG tablet Take 1 tablet (500 mg total) by mouth 2 (two) times daily. (Patient not taking: Reported on 09/01/2019) 14 tablet 0  . ondansetron (ZOFRAN) 4 MG tablet Take 1 tablet (4 mg total) by mouth every 8 (eight) hours as needed for nausea or vomiting. (Patient not taking: Reported on 09/01/2019) 30 tablet 0  . SPIRIVA HANDIHALER 18 MCG inhalation capsule PLACE 1 CAPSULE INTO INHALER AND INHALE ONCE DAILY. (Patient not taking: Reported on 09/01/2019) 30 capsule 0  . traMADol (ULTRAM) 50 MG tablet Take 1 tablet (50 mg total) by mouth every 6 (six) hours as needed. (Patient not taking: Reported on 09/01/2019) 30 tablet 0    Results for orders placed or performed during the hospital encounter of 09/07/19 (from the past 48 hour(s))  Glucose, capillary     Status: Abnormal   Collection Time: 09/07/19  1:11 PM  Result Value Ref Range   Glucose-Capillary 108 (H) 70 - 99 mg/dL  Basic metabolic panel     Status: Abnormal   Collection Time:  09/07/19  1:33 PM  Result Value Ref Range    Sodium 142 135 - 145 mmol/L   Potassium 4.2 3.5 - 5.1 mmol/L    Comment: SLIGHT HEMOLYSIS   Chloride 104 98 - 111 mmol/L   CO2 25 22 - 32 mmol/L   Glucose, Bld 112 (H) 70 - 99 mg/dL   BUN 14 8 - 23 mg/dL   Creatinine, Ser 1.13 (H) 0.44 - 1.00 mg/dL   Calcium 9.1 8.9 - 10.3 mg/dL   GFR calc non Af Amer 51 (L) >60 mL/min   GFR calc Af Amer 59 (L) >60 mL/min   Anion gap 13 5 - 15    Comment: Performed at [facility] [address], [place], [place] [address]  CBC     Status: None   Collection Time: 09/07/19  1:33 PM  Result Value Ref Range   WBC 7.8 4.0 - 10.5 K/uL   RBC 4.24 3.87 - 5.11 MIL/uL   Hemoglobin 13.3 12.0 - 15.0 g/dL   HCT 41.3 36.0 - 46.0 %   MCV 97.4 80.0 - 100.0 fL   MCH 31.4 26.0 - 34.0 pg   MCHC 32.2 30.0 - 36.0 g/dL   RDW 14.6 11.5 - 15.5 %   Platelets 264 150 - 400 K/uL   nRBC 0.0 0.0 - 0.2 %    Comment: Performed at [facility], [place] [address] [place], [place] [address]  Urinalysis, Routine w reflex microscopic     Status: Abnormal   Collection Time: 09/07/19  1:34 PM  Result Value Ref Range   Color, Urine YELLOW YELLOW   APPearance CLEAR CLEAR   Specific Gravity, Urine 1.030 1.005 - 1.030   pH 5.0 5.0 - 8.0   Glucose, UA NEGATIVE NEGATIVE mg/dL   Hgb urine dipstick NEGATIVE NEGATIVE   Bilirubin Urine NEGATIVE NEGATIVE   Ketones, ur NEGATIVE NEGATIVE mg/dL   Protein, ur NEGATIVE NEGATIVE mg/dL   Nitrite NEGATIVE NEGATIVE   Leukocytes,Ua TRACE (A) NEGATIVE   RBC / HPF 0-5 0 - 5 RBC/hpf   WBC, UA 0-5 0 - 5 WBC/hpf   Bacteria, UA NONE SEEN NONE SEEN   Squamous Epithelial / LPF 0-5 0 - 5    Comment: Performed at [facility], [place] [address], [place], [place] [address]  Urine culture     Status: Abnormal   Collection Time: 09/07/19  1:34 PM   Specimen: Urine, Clean Catch  Result Value Ref Range   Specimen Description URINE, CLEAN CATCH    Special Requests      NONE Performed at [facility], [place] [address],  [place], [place] [address]    Culture MULTIPLE SPECIES PRESENT, SUGGEST RECOLLECTION (A)    Report Status 09/08/2019 FINAL   Surgical pcr screen     Status: None   Collection Time: 09/07/19  1:34 PM   Specimen: Nasal Mucosa; Nasal Swab  Result Value Ref Range   MRSA, PCR NEGATIVE NEGATIVE   Staphylococcus aureus NEGATIVE NEGATIVE    Comment: (NOTE) The Xpert SA Assay (FDA approved for NASAL specimens in patients 77 years of age and older), is one component of a comprehensive surveillance program. It is not intended to diagnose infection nor to guide or monitor treatment. Performed at [facility], [place] [address] [place], [place] [address]    *Note: Due to a large number of results and/or encounters for the requested time period, some results have not been displayed. A  complete set of results can be found in Results Review.   No results found.  Review of Systems  Musculoskeletal: Positive for arthralgias.  All other systems reviewed and are negative.   Blood pressure (!) 147/77, pulse 92, temperature 98.1 F (36.7 C), temperature source Oral, resp. rate 18, height '5\' 4"'  (1.626 m), weight 68.6 kg, SpO2 98 %. Physical Exam  Constitutional: She appears well-developed.  HENT:  Head: Normocephalic.  Eyes: Pupils are equal, round, and reactive to light.  Cardiovascular: Normal rate.  Respiratory: Effort normal.  Musculoskeletal:     Cervical back: Normal range of motion.  Neurological: She is alert.  Skin: Skin is warm.  Psychiatric: She has a normal mood and affect.  Passive range of motion is just to 90 degrees of abduction and forward flexion.  She does have coarse grinding and crepitus with infraspinatus and supraspinatus weakness on exam.  No Popeye deformity.  Moderate pain with range of motion is present.  No AC joint tenderness is noted.  Deltoid is functional.  Motor sensory function to the hand is intact.  Assessment/Plan Impression is rotator cuff arthropathy with  irreparable rotator cuff tear.  Discussed at length with patient operative options including but limited to observation versus rotator cuff repair which I do not think is feasible in her case versus reverse shoulder replacement.  Although she is young for reverse shoulder replacement I think overall with her pain and functional limitations that would be her best option.  Risk and benefits are discussed include not limited to infection nerve vessel damage instability as well as potential need for revision.  Plan to use patient specific instrumentation to obtain optimal implant position for longevity of the prosthesis.  Patient understands the risk and benefits and wishes to proceed.  All questions answered  Anderson Malta, MD 09/09/2019, 11:20 AM

## 2019-09-09 NOTE — Anesthesia Procedure Notes (Signed)
Procedure Name: Intubation Date/Time: 09/09/2019 12:37 PM Performed by: Imagene Riches, CRNA Pre-anesthesia Checklist: Patient identified, Emergency Drugs available, Suction available and Patient being monitored Patient Re-evaluated:Patient Re-evaluated prior to induction Oxygen Delivery Method: Circle System Utilized Preoxygenation: Pre-oxygenation with 100% oxygen Induction Type: IV induction Ventilation: Mask ventilation without difficulty Laryngoscope Size: Miller and 2 Grade View: Grade I Tube type: Oral Tube size: 7.0 mm Number of attempts: 1 Airway Equipment and Method: Stylet and Oral airway Placement Confirmation: ETT inserted through vocal cords under direct vision,  positive ETCO2 and breath sounds checked- equal and bilateral Secured at: 21 cm Tube secured with: Tape Dental Injury: Teeth and Oropharynx as per pre-operative assessment

## 2019-09-09 NOTE — Progress Notes (Signed)
Orthopedic Tech Progress Note Patient Details:  Jill Phillips 09/30/1955 [id number] Was called by PACU RN to come and assist with the shoulder immobilizer that patient was given. Not the ones we're used to seeing.  Patient ID: [name], female   DOB: July 05, 1956, 64 y.o.   MRN: [id number]   Janit Pagan 09/09/2019, 5:41 PM

## 2019-09-09 NOTE — Transfer of Care (Signed)
Immediate Anesthesia Transfer of Care Note  Patient: SHAVONTAE GIBEAULT  Procedure(s) Performed: LEFT REVERSE SHOULDER REPLACEMENT (Left Shoulder)  Patient Location: PACU  Anesthesia Type:GA combined with regional for post-op pain  Level of Consciousness: awake  Airway & Oxygen Therapy: Patient Spontanous Breathing and Patient connected to nasal cannula oxygen  Post-op Assessment: Report given to RN and Post -op Vital signs reviewed and stable  Post vital signs: Reviewed and stable  Last Vitals:  Vitals Value Taken Time  BP 135/90 09/09/19 1553  Temp    Pulse 99 09/09/19 1555  Resp 18 09/09/19 1555  SpO2 97 % 09/09/19 1555  Vitals shown include unvalidated device data.  Last Pain:  Vitals:   09/09/19 1018  TempSrc: Oral      Patients Stated Pain Goal: 3 (09/11/01 4961)  Complications: No apparent anesthesia complications

## 2019-09-09 NOTE — Anesthesia Procedure Notes (Signed)
Anesthesia Regional Block: Interscalene brachial plexus block   Pre-Anesthetic Checklist: ,, timeout performed, Correct Patient, Correct Site, Correct Laterality, Correct Procedure, Correct Position, site marked, Risks and benefits discussed,  Surgical consent,  Pre-op evaluation,  At surgeon's request and post-op pain management  Laterality: Left  Prep: chloraprep       Needles:  Injection technique: Single-shot  Needle Type: Echogenic Stimulator Needle     Needle Length: 10cm  Needle Gauge: 21     Additional Needles:   Procedures:,,,, ultrasound used (permanent image in chart),,,,  Narrative:  Start time: 09/09/2019 12:05 PM End time: 09/09/2019 12:10 PM Injection made incrementally with aspirations every 5 mL.  Performed by: Personally  Anesthesiologist: Lidia Collum, MD  Additional Notes: Monitors applied. Injection made in 5cc increments. No resistance to injection. Good needle visualization. Patient tolerated procedure well.

## 2019-09-10 ENCOUNTER — Other Ambulatory Visit: Payer: Self-pay | Admitting: Orthopedic Surgery

## 2019-09-10 ENCOUNTER — Other Ambulatory Visit: Payer: Self-pay | Admitting: Surgical

## 2019-09-10 DIAGNOSIS — M75102 Unspecified rotator cuff tear or rupture of left shoulder, not specified as traumatic: Secondary | ICD-10-CM

## 2019-09-10 DIAGNOSIS — M4316 Spondylolisthesis, lumbar region: Secondary | ICD-10-CM

## 2019-09-10 DIAGNOSIS — IMO0002 Reserved for concepts with insufficient information to code with codable children: Secondary | ICD-10-CM

## 2019-09-10 MED ORDER — HYDROCODONE-ACETAMINOPHEN 5-325 MG PO TABS: 1.0000 | ORAL_TABLET | Freq: Four times a day (QID) | ORAL | 0 refills | Status: DC | PRN

## 2019-09-10 MED ORDER — ASPIRIN EC 81 MG PO TBEC: 81.0000 mg | DELAYED_RELEASE_TABLET | Freq: Two times a day (BID) | ORAL | 0 refills | Status: DC

## 2019-09-10 MED ORDER — HYDROCODONE-ACETAMINOPHEN 10-325 MG PO TABS: 1.0000 | ORAL_TABLET | Freq: Four times a day (QID) | ORAL | 0 refills | Status: DC | PRN

## 2019-09-10 MED ORDER — METHOCARBAMOL 500 MG PO TABS: 500.0000 mg | ORAL_TABLET | Freq: Three times a day (TID) | ORAL | 0 refills | Status: DC | PRN

## 2019-09-10 NOTE — Plan of Care (Signed)
  Problem: Pain Management: Goal: Pain level will decrease with appropriate interventions Outcome: Progressing   Problem: Activity: Goal: Ability to tolerate increased activity will improve Outcome: Progressing   Problem: Safety: Goal: Ability to remain free from injury will improve Outcome: Progressing

## 2019-09-10 NOTE — Progress Notes (Signed)
  Subjective: Jill Phillips is a 64 y.o. female s/p left RSA.  They are POD 1.  Pt's pain is controlled.  Block is wearing off.  Patient states that she is doing well and requests discharge home today.    Objective: Vital signs in last 24 hours: Temp:  [97.7 F (36.5 C)-99.6 F (37.6 C)] 98.5 F (36.9 C) (01/08 0345) Pulse Rate:  [79-101] 84 (01/08 0345) Resp:  [11-22] 14 (01/08 0345) BP: (110-147)/(66-90) 118/75 (01/08 0345) SpO2:  [86 %-100 %] 94 % (01/08 0345) Weight:  [68.6 kg] 68.6 kg (01/07 1031)  Intake/Output from previous day: 01/07 0701 - 01/08 0700 In: 939.6 [P.O.:480; I.V.:59.6; IV Piggyback:400] Out: 100 [Blood:100] Intake/Output this shift: No intake/output data recorded.  Exam:  No gross blood or drainage overlying the dressing 2+ radial pulse Sensation intact distally in the left hand Able to extend the left wrist, flex IP joint of the left thumb, adduction/abduction fingers, extend left thumb.   Labs: Recent Labs    09/07/19 1333  HGB 13.3   Recent Labs    09/07/19 1333  WBC 7.8  RBC 4.24  HCT 41.3  PLT 264   Recent Labs    09/07/19 1333  NA 142  K 4.2  CL 104  CO2 25  BUN 14  CREATININE 1.13*  GLUCOSE 112*  CALCIUM 9.1   No results for input(s): LABPT, INR in the last 72 hours.  Assessment/Plan: Pt is POD 1 s/p left RSA    -Plan to discharge to home today  -No lifting with the operative arm  -Patient will follow up with Dr. Marlou Sa in clinic in 2 weeks.  Jill Phillips L Jill Phillips 09/10/2019, 8:17 AM

## 2019-09-10 NOTE — Progress Notes (Signed)
RN dalis reviewed AVS with pt; belongings gathered and pt dressed. IV removed and documented. Pt family received pt, all questions answered to satisfaction. [place], RN 09/10/2019 3:01 PM

## 2019-09-10 NOTE — Telephone Encounter (Signed)
Patient's daughter called in requesting patient's medication from Dr. Randel Pigg office. Patient's daughter Glenard Haring stated mother had surgery on 09/09/2019. Dr. Marlou Sa told patient and daughter medications will be called in upon discharge. Patient's daughter requesting Fentanyl patches and hydrocodone be called into [facility] on [facility]. Patient's daughter is asking this be high priority

## 2019-09-10 NOTE — Anesthesia Postprocedure Evaluation (Signed)
Anesthesia Post Note  Patient: Jill Phillips  Procedure(s) Performed: LEFT REVERSE SHOULDER REPLACEMENT (Left Shoulder)     Patient location during evaluation: PACU Anesthesia Type: General Level of consciousness: awake and alert Pain management: pain level controlled Vital Signs Assessment: post-procedure vital signs reviewed and stable Respiratory status: spontaneous breathing, nonlabored ventilation, respiratory function stable and patient connected to nasal cannula oxygen Cardiovascular status: blood pressure returned to baseline and stable Postop Assessment: no apparent nausea or vomiting Anesthetic complications: no    Last Vitals:  Vitals:   09/10/19 0345 09/10/19 0900  BP: 118/75 118/88  Pulse: 84 88  Resp: 14   Temp: 36.9 C 36.9 C  SpO2: 94% 94%    Last Pain:  Vitals:   09/10/19 0900  TempSrc: Oral  PainSc:                  Lidia Collum

## 2019-09-10 NOTE — Telephone Encounter (Signed)
Please advise. Thanks.  

## 2019-09-10 NOTE — Telephone Encounter (Signed)
Called and spoke with patient's daughter.  Discharge orders are in for pt discharge.  Medications have been sent to pharmacy.  Patient will use the new dose of Fentanyl patches from her pain management physician that she already has and she will take an additional Hydrocodone 5mg  q6h. Patient may call with any more concerns.

## 2019-09-10 NOTE — Evaluation (Signed)
Occupational Therapy Evaluation Patient Details Name: Jill Phillips MRN: [id number] DOB: February 22, 1956 Today's Date: 09/10/2019    History of Present Illness 64 yo female s/p L reverse TSA by Dr Marlou Sa PMH active smoker , anxiety, chronic back pain COPD chronic pain syndrome emphysema herpes genitalia back surgery RTKA RTHA   Clinical Impression   Pt requires mod A with UB and LB bathing and dressing due to L shoulder immobilization and ROM restrictions following reverse TSA. Pt is independent with bed mobility and functional mobility/ADL transfers. Pt lives at home with her daughter and was independent with ADLs/selfcare, home mgt and driving. Pt will have assist at home from her daughter at d/c. Pt educated on shoulder protocol, sling wear, ADL techniques, ROM of L digits/wrist/elbow and L UE pendulums as prescribed per surgeon with handouts provided. All education completed and no further acute OT is indicated at this time. OT will sign off    Follow Up Recommendations  Follow surgeon's recommendation for DC plan and follow-up therapies    Equipment Recommendations  None recommended by OT    Recommendations for Other Services       Precautions / Restrictions Restrictions Weight Bearing Restrictions: Yes LUE Weight Bearing: Non weight bearing      Mobility Bed Mobility Overal bed mobility: Independent                Transfers Overall transfer level: Independent Equipment used: None                  Balance Overall balance assessment: No apparent balance deficits (not formally assessed)                                         ADL either performed or assessed with clinical judgement   ADL Overall ADL's : Needs assistance/impaired Eating/Feeding: Set up;Independent;Sitting   Grooming: Wash/dry hands;Wash/dry face;Brushing hair;Independent;Standing   Upper Body Bathing: Moderate assistance   Lower Body Bathing: Moderate assistance   Upper Body  Dressing : Moderate assistance   Lower Body Dressing: Moderate assistance   Toilet Transfer: Independent;Ambulation   Toileting- Clothing Manipulation and Hygiene: Moderate assistance   Tub/ Shower Transfer: Independent;Ambulation;3 in 1   Functional mobility during ADLs: Independent General ADL Comments: pt educated on compensatory ADL techniques, sling wear     Vision Baseline Vision/History: Wears glasses Patient Visual Report: No change from baseline       Perception     Praxis      Pertinent Vitals/Pain Pain Assessment: 0-10 Pain Score: 6  Pain Descriptors / Indicators: Aching;Sore;Operative site guarding Pain Intervention(s): Premedicated before session;Monitored during session;Repositioned     Hand Dominance Right   Extremity/Trunk Assessment Upper Extremity Assessment Upper Extremity Assessment: Overall WFL for tasks assessed;LUE deficits/detail LUE Deficits / Details: pendulums ok and AROM hand wrist elbow only  LUE: Unable to fully assess due to immobilization       Cervical / Trunk Assessment Cervical / Trunk Assessment: Other exceptions Cervical / Trunk Exceptions: hx of back surgery, chronic back pain , R THA and R TKA    Communication Communication Communication: No difficulties   Cognition Arousal/Alertness: Awake/alert Behavior During Therapy: WFL for tasks assessed/performed Overall Cognitive Status: Within Functional Limits for tasks assessed  General Comments       Exercises Shoulder Exercises Pendulum Exercise: Left;PROM;Standing Elbow Flexion: AROM;Left;5 reps;Seated Elbow Extension: AROM;Left;5 reps;Seated Wrist Flexion: AROM;Left;5 reps;Seated Wrist Extension: AROM;Left;5 reps;Seated Digit Composite Flexion: AROM;Left;5 reps Composite Extension: AROM;Left;5 reps   Shoulder Instructions Shoulder Instructions Donning/doffing shirt without moving shoulder: Moderate  assistance Method for sponge bathing under operated UE: Supervision/safety Donning/doffing sling/immobilizer: Moderate assistance Correct positioning of sling/immobilizer: Independent Pendulum exercises (written home exercise program): Supervision/safety ROM for elbow, wrist and digits of operated UE: Independent Proper positioning of operated UE when showering: Independent Positioning of UE while sleeping: [place] expects to be discharged to:: Private residence Living Arrangements: Children Available Help at Discharge: Family Type of Home: House Home Access: Level entry     [place]: One level     Bathroom Shower/Tub: Teacher, early years/pre: [place] Accessibility: Yes How Accessible: Accessible via walker [place]: [place] - 2 wheels;Shower seat;Bedside commode          Prior Functioning/Environment Level of Independence: Independent                 OT Problem List: Impaired UE functional use;Pain;Decreased range of motion      OT Treatment/Interventions: Self-care/ADL training;Therapeutic activities;Patient/family education    OT Goals(Current goals can be found in the care plan section) Acute Rehab OT Goals Patient Stated Goal: go home  OT Frequency:     Barriers to D/C:            Co-evaluation              AM-PAC OT "6 Clicks" Daily Activity     Outcome Measure Help from another person eating meals?: None Help from another person taking care of personal grooming?: None Help from another person toileting, which includes using toliet, bedpan, or urinal?: A Little Help from another person bathing (including washing, rinsing, drying)?: A Little Help from another person to put on and taking off regular upper body clothing?: A Little Help from another person to put on and taking off regular lower body clothing?: A Little 6 Click Score: 20   End of Session Equipment Utilized During  Treatment: Other (comment)(L LE sling)  Activity Tolerance: Patient tolerated treatment well Patient left: in chair;with call bell/phone within reach  OT Visit Diagnosis: Pain;Muscle weakness (generalized) (M62.81) Pain - Right/Left: Left Pain - part of body: Shoulder                Time: 4462-8638 OT Time Calculation (min): 38 min Charges:  OT General Charges $OT Visit: 1 Visit OT Evaluation $OT Eval Low Complexity: 1 Low OT Treatments $Self Care/Home Management : 8-22 mins $Therapeutic Activity: 8-22 mins    Britt Bottom 09/10/2019, 12:24 PM

## 2019-09-10 NOTE — Plan of Care (Signed)

## 2019-09-10 NOTE — Plan of Care (Signed)
  Problem: Pain Management: Goal: Pain level will decrease with appropriate interventions 09/10/2019 0131 by Josepha Pigg, RN Outcome: Progressing 09/10/2019 0124 by Josepha Pigg, RN Outcome: Progressing   Problem: Activity: Goal: Ability to tolerate increased activity will improve 09/10/2019 0131 by Josepha Pigg, RN Outcome: Progressing 09/10/2019 0124 by Josepha Pigg, RN Outcome: Progressing   Problem: Safety: Goal: Ability to remain free from injury will improve 09/10/2019 0131 by Josepha Pigg, RN Outcome: Progressing 09/10/2019 0124 by Josepha Pigg, RN Outcome: Progressing

## 2019-09-10 NOTE — Progress Notes (Signed)
PT Cancellation Note  Patient Details Name: Jill Phillips MRN: [id number] DOB: 1956-05-14   Cancelled Treatment:    Reason Eval/Treat Not Completed: PT screened, no needs identified, will sign off. Per OT evaluation, the pt has no acute PT needs at this time, and therefore PT will sign off. Thank you for the consult, please re-consult if the pt's status changes.   Karma Ganja, PT, DPT   Acute Rehabilitation Department [phone]   Otho Bellows 09/10/2019, 10:40 AM

## 2019-09-11 ENCOUNTER — Other Ambulatory Visit: Payer: Self-pay | Admitting: Surgical

## 2019-09-11 MED ORDER — HYDROCODONE-ACETAMINOPHEN 10-325 MG PO TABS: 1.0000 | ORAL_TABLET | Freq: Four times a day (QID) | ORAL | 0 refills | Status: DC | PRN

## 2019-09-11 NOTE — Telephone Encounter (Signed)
Patient called and spoke with me.  Pain is not controlled with Fentanyl patch and they have not been able to get the Hydrocodone filled.  Spoke with patient and daughter and explained how to use CPM machine postop.  Will cancel Hydrocodone RXs currently and represcribe how the patient has taken the hydrocodone in the past, and we will see if insurance covers it when written this way. If not, patient understands she may have to pay out of pocket.  Will call office with any issues on Monday.

## 2019-09-13 ENCOUNTER — Encounter: Payer: Self-pay | Admitting: *Deleted

## 2019-09-13 ENCOUNTER — Other Ambulatory Visit: Payer: Self-pay | Admitting: Internal Medicine

## 2019-09-13 ENCOUNTER — Telehealth: Payer: Self-pay

## 2019-09-13 DIAGNOSIS — G894 Chronic pain syndrome: Secondary | ICD-10-CM | POA: Diagnosis not present

## 2019-09-13 DIAGNOSIS — M549 Dorsalgia, unspecified: Secondary | ICD-10-CM | POA: Diagnosis not present

## 2019-09-13 DIAGNOSIS — Z471 Aftercare following joint replacement surgery: Secondary | ICD-10-CM | POA: Diagnosis not present

## 2019-09-13 DIAGNOSIS — G2581 Restless legs syndrome: Secondary | ICD-10-CM | POA: Diagnosis not present

## 2019-09-13 DIAGNOSIS — N183 Chronic kidney disease, stage 3 unspecified: Secondary | ICD-10-CM | POA: Diagnosis not present

## 2019-09-13 DIAGNOSIS — M4316 Spondylolisthesis, lumbar region: Secondary | ICD-10-CM | POA: Diagnosis not present

## 2019-09-13 DIAGNOSIS — J439 Emphysema, unspecified: Secondary | ICD-10-CM | POA: Diagnosis not present

## 2019-09-13 DIAGNOSIS — E1122 Type 2 diabetes mellitus with diabetic chronic kidney disease: Secondary | ICD-10-CM | POA: Diagnosis not present

## 2019-09-13 DIAGNOSIS — G4700 Insomnia, unspecified: Secondary | ICD-10-CM | POA: Diagnosis not present

## 2019-09-13 DIAGNOSIS — I129 Hypertensive chronic kidney disease with stage 1 through stage 4 chronic kidney disease, or unspecified chronic kidney disease: Secondary | ICD-10-CM | POA: Diagnosis not present

## 2019-09-13 DIAGNOSIS — Z96612 Presence of left artificial shoulder joint: Secondary | ICD-10-CM | POA: Diagnosis not present

## 2019-09-13 DIAGNOSIS — E039 Hypothyroidism, unspecified: Secondary | ICD-10-CM | POA: Diagnosis not present

## 2019-09-13 DIAGNOSIS — M5136 Other intervertebral disc degeneration, lumbar region: Secondary | ICD-10-CM | POA: Diagnosis not present

## 2019-09-13 DIAGNOSIS — E785 Hyperlipidemia, unspecified: Secondary | ICD-10-CM | POA: Diagnosis not present

## 2019-09-13 DIAGNOSIS — K219 Gastro-esophageal reflux disease without esophagitis: Secondary | ICD-10-CM | POA: Diagnosis not present

## 2019-09-13 DIAGNOSIS — G4730 Sleep apnea, unspecified: Secondary | ICD-10-CM | POA: Diagnosis not present

## 2019-09-13 DIAGNOSIS — Z9181 History of falling: Secondary | ICD-10-CM | POA: Diagnosis not present

## 2019-09-13 NOTE — Telephone Encounter (Signed)
[facility] called stated he needed protocol for patient for HHPT. I s/w Dr Marlou Sa and he said ok for [facility] and PROM to tolerance.

## 2019-09-14 NOTE — Telephone Encounter (Signed)
Last OV 08/02/19 Next OV NA Last RF 08/17/19

## 2019-09-15 ENCOUNTER — Other Ambulatory Visit: Payer: Self-pay | Admitting: Surgical

## 2019-09-15 MED ORDER — OXYCODONE-ACETAMINOPHEN 5-325 MG PO TABS: 1.0000 | ORAL_TABLET | Freq: Four times a day (QID) | ORAL | 0 refills | Status: DC | PRN

## 2019-09-15 NOTE — Telephone Encounter (Signed)
Patient called and spoke with me.  Unable to get any Hydrocodone prescriptions filled.  She has spoken with her pharmacy and they cannot fill the hydrocodone RXs but did tell her they could fill Percocet prescriptions.  She has reached out to her pain management clinic since surgery but has not had a response yet.  Prescribed Percocet 5mg  q6h prn severe pain.  Cautioned against taking more than one tablet at a time.  She has a pulse oximetry at home and Narcan available around the houses.  If she is not able to get Percocet filled, she will have to figure out a solution with her pain management clinic.  Patient understands and agrees with plan.

## 2019-09-16 ENCOUNTER — Telehealth: Payer: Self-pay | Admitting: Orthopedic Surgery

## 2019-09-16 NOTE — Telephone Encounter (Signed)
Patient needs information on her surgery faxed to Pain Mgmt sent to   Dr.Plummer Fax#[phone]

## 2019-09-16 NOTE — Telephone Encounter (Signed)
Faxed op note to number provided.

## 2019-09-20 NOTE — Discharge Summary (Signed)
Physician Discharge Summary      Patient ID: Jill Phillips MRN: [id number] DOB/AGE: 1956-04-17 64 y.o.  Admit date: 09/09/2019 Discharge date: 09/10/2019  Admission Diagnoses:  Active Problems:   Status post shoulder surgery   Discharge Diagnoses:  Same  Surgeries: Procedure(s): LEFT REVERSE SHOULDER REPLACEMENT on 09/09/2019   Consultants:   Discharged Condition: Stable  Hospital Course: Jill Phillips is an 64 y.o. female who was admitted 09/09/2019 with a chief complaint of left shoulder pain, and found to have a diagnosis of left shoulder rotator cuff arthropathy.  They were brought to the operating room on 09/09/2019 and underwent the above named procedures.  Pt awoke from anesthesia without complication and was transferred to the floor. On POD1, patient's pain was well-controlled and her block was wearing off.  She felt comfortable for release and was discharged home the same day.  Pt will f/u with Dr. Marlou Sa in clinic in ~2 weeks.   Antibiotics given:  Anti-infectives (From admission, onward)   Start     Dose/Rate Route Frequency Ordered Stop   09/09/19 2200  vancomycin (VANCOCIN) IVPB 1000 mg/200 mL premix     1,000 mg 200 mL/hr over 60 Minutes Intravenous Every 12 hours 09/09/19 1743 09/10/19 1137   09/09/19 1452  vancomycin (VANCOCIN) powder  Status:  Discontinued       As needed 09/09/19 1452 09/09/19 1550   09/09/19 1015  vancomycin (VANCOCIN) IVPB 1000 mg/200 mL premix     1,000 mg 200 mL/hr over 60 Minutes Intravenous On call to O.R. 09/09/19 1010 09/09/19 1135    .  Recent vital signs:  Vitals:   09/10/19 0345 09/10/19 0900  BP: 118/75 118/88  Pulse: 84 88  Resp: 14   Temp: 98.5 F (36.9 C) 98.4 F (36.9 C)  SpO2: 94% 94%    Recent laboratory studies:  Results for orders placed or performed during the hospital encounter of 09/07/19  Urine culture   Specimen: Urine, Clean Catch  Result Value Ref Range   Specimen Description URINE, CLEAN CATCH    Special  Requests      NONE Performed at [facility], [place] [address] [place], [place] [address]    Culture MULTIPLE SPECIES PRESENT, SUGGEST RECOLLECTION (A)    Report Status 09/08/2019 FINAL   Surgical pcr screen   Specimen: Nasal Mucosa; Nasal Swab  Result Value Ref Range   MRSA, PCR NEGATIVE NEGATIVE   Staphylococcus aureus NEGATIVE NEGATIVE  Glucose, capillary  Result Value Ref Range   Glucose-Capillary 108 (H) 70 - 99 mg/dL  Basic metabolic panel  Result Value Ref Range   Sodium 142 135 - 145 mmol/L   Potassium 4.2 3.5 - 5.1 mmol/L   Chloride 104 98 - 111 mmol/L   CO2 25 22 - 32 mmol/L   Glucose, Bld 112 (H) 70 - 99 mg/dL   BUN 14 8 - 23 mg/dL   Creatinine, Ser 1.13 (H) 0.44 - 1.00 mg/dL   Calcium 9.1 8.9 - 10.3 mg/dL   GFR calc non Af Amer 51 (L) >60 mL/min   GFR calc Af Amer 59 (L) >60 mL/min   Anion gap 13 5 - 15  CBC  Result Value Ref Range   WBC 7.8 4.0 - 10.5 K/uL   RBC 4.24 3.87 - 5.11 MIL/uL   Hemoglobin 13.3 12.0 - 15.0 g/dL   HCT 41.3 36.0 - 46.0 %   MCV 97.4 80.0 - 100.0 fL   MCH 31.4 26.0 - 34.0  pg   MCHC 32.2 30.0 - 36.0 g/dL   RDW 14.6 11.5 - 15.5 %   Platelets 264 150 - 400 K/uL   nRBC 0.0 0.0 - 0.2 %  Urinalysis, Routine w reflex microscopic  Result Value Ref Range   Color, Urine YELLOW YELLOW   APPearance CLEAR CLEAR   Specific Gravity, Urine 1.030 1.005 - 1.030   pH 5.0 5.0 - 8.0   Glucose, UA NEGATIVE NEGATIVE mg/dL   Hgb urine dipstick NEGATIVE NEGATIVE   Bilirubin Urine NEGATIVE NEGATIVE   Ketones, ur NEGATIVE NEGATIVE mg/dL   Protein, ur NEGATIVE NEGATIVE mg/dL   Nitrite NEGATIVE NEGATIVE   Leukocytes,Ua TRACE (A) NEGATIVE   RBC / HPF 0-5 0 - 5 RBC/hpf   WBC, UA 0-5 0 - 5 WBC/hpf   Bacteria, UA NONE SEEN NONE SEEN   Squamous Epithelial / LPF 0-5 0 - 5   *Note: Due to a large number of results and/or encounters for the requested time period, some results have not been displayed. A complete set of results can be found in Results Review.     Discharge Medications:   Allergies as of 09/10/2019      Reactions   Flagyl [metronidazole] Diarrhea   Limonene Itching   Yeast infection in mouth   Sulfa Antibiotics Other (See Comments)   Yeast infection in mouth   Doxycycline    Mouth soreness - reaction vs thrush?   Amoxicillin Other (See Comments)   REACTION: Oral yeast infection Has patient had a PCN reaction causing immediate rash, facial/tongue/throat swelling, SOB or lightheadedness with hypotension: No Has patient had a PCN reaction causing severe rash involving mucus membranes or skin necrosis: No Has patient had a PCN reaction that required hospitalization No Has patient had a PCN reaction occurring within the last 10 years: No If all of the above answers are "NO", then may proceed with Cephalosporin use.   Chlorzoxazone Other (See Comments)   headache   Codeine Other (See Comments)   headache   Darvocet [propoxyphene N-acetaminophen] Itching   Dilaudid [hydromorphone Hcl] Itching   Keflex [cephalexin] Other (See Comments)   Pt does not recall reaction (maybe yeast infection)   Morphine And Related Itching   Nitrofurantoin Monohyd Macro Hives   Reaction to [employer] [oxycodone-acetaminophen] Itching   Patient can tolerate Acetaminophen solely      Medication List    STOP taking these medications   HYDROcodone-acetaminophen 10-325 MG tablet Commonly known as: NORCO   lidocaine 5 % Commonly known as: Lidoderm   ondansetron 4 MG tablet Commonly known as: Zofran   Spiriva HandiHaler 18 MCG inhalation capsule Generic drug: tiotropium   traMADol 50 MG tablet Commonly known as: ULTRAM     TAKE these medications   acetaminophen 500 MG tablet Commonly known as: TYLENOL Take 1,000 mg by mouth every 6 (six) hours as needed (for headaches.).   albuterol 108 (90 Base) MCG/ACT inhaler Commonly known as: ProAir HFA INHALE 1 PUFF INTO THE LUNGS EVERY 6 HOURS AS NEEDED FOR WHEEZING OR SHORTNESS OF  BREATH.NEED OFFICE VISIT FOR FURTHER REFILLS What changed:   how much to take  how to take this  when to take this  reasons to take this  additional instructions   amLODipine 10 MG tablet Commonly known as: NORVASC TAKE 1 TABLET BY MOUTH DAILY.   aspirin EC 81 MG tablet Take 1 tablet (81 mg total) by mouth 2 (two) times daily.   atorvastatin 20 MG tablet  Commonly known as: LIPITOR TAKE 1 TABLET BY MOUTH DAILY. What changed: when to take this   benazepril 40 MG tablet Commonly known as: LOTENSIN TAKE 1 TABLET BY MOUTH DAILY.   benzoyl peroxide 5 % external liquid Generic drug: benzoyl peroxide Wash affected shoulder daily with wash for 3 days prior to surgery   cimetidine 400 MG tablet Commonly known as: TAGAMET Take 400 mg by mouth 2 (two) times daily.   docusate sodium 100 MG capsule Commonly known as: COLACE Take 200 mg by mouth at bedtime.   fentaNYL 50 MCG/HR Commonly known as: DURAGESIC PLACE 1 PATCH ONTO THE SKIN EVERY 3 DAYS. What changed: Another medication with the same name was removed. Continue taking this medication, and follow the directions you see here.   gabapentin 400 MG capsule Commonly known as: Neurontin Take 1 capsule (400 mg total) by mouth 3 (three) times daily. What changed:   how much to take  when to take this   levothyroxine 88 MCG tablet Commonly known as: SYNTHROID TAKE 1 TABLET BY MOUTH DAILY.   methocarbamol 500 MG tablet Commonly known as: ROBAXIN Take 1 tablet (500 mg total) by mouth every 8 (eight) hours as needed for muscle spasms. What changed:   when to take this  reasons to take this   Narcan 4 MG/0.1ML Liqd nasal spray kit Generic drug: naloxone Place 1 spray into the nose as needed (accidental overdose).   omeprazole 40 MG capsule Commonly known as: PRILOSEC TAKE 1 CAPSULE BY MOUTH 2 TIMES DAILY BEFORE A MEAL. What changed: See the new instructions.   OneTouch Verio Flex System w/Device Kit USE TO  TEST BLOOD SUGAR UP TO 4 TIMES A DAY   promethazine 25 MG tablet Commonly known as: PHENERGAN Take 1 tablet (25 mg total) by mouth every 8 (eight) hours as needed for nausea or vomiting.   QUEtiapine 100 MG tablet Commonly known as: SEROQUEL TAKE 1 TABLET BY MOUTH AT BEDTIME.   rOPINIRole 0.5 MG tablet Commonly known as: REQUIP TAKE 2 TABLETS BY MOUTH AT BEDTIME. What changed:   how much to take  how to take this  when to take this  additional instructions   SENNA LAX PO Take 2 tablets by mouth at bedtime.   tiZANidine 4 MG tablet Commonly known as: ZANAFLEX Take 4 mg by mouth 3 (three) times daily.   valACYclovir 500 MG tablet Commonly known as: VALTREX TAKE 1 TABLET BY MOUTH DAILY. What changed: when to take this   venlafaxine XR 150 MG 24 hr capsule Commonly known as: EFFEXOR-XR TAKE 2 CAPSULES BY MOUTH DAILY.   Vitamin D3 Super Strength 50 MCG (2000 UT) Tabs Generic drug: Cholecalciferol Take 2,000 Units by mouth daily.       Diagnostic Studies: DG Shoulder Left Port  Result Date: 09/09/2019 CLINICAL DATA:  Postoperative evaluation. EXAM: LEFT SHOULDER COMPARISON:  None. FINDINGS: A left shoulder prosthesis is seen. There is no evidence of surrounding lucency to suggest the presence of hardware loosening or infection. There is no evidence of fracture or dislocation. Soft tissues are unremarkable. IMPRESSION: No evidence of hardware complication status post left shoulder arthroplasty. Electronically Signed   By: Virgina Norfolk M.D.   On: 09/09/2019 17:16    Disposition: Discharge disposition: 01-Home or Self Care       Discharge Instructions    Call MD / Call 911   Complete by: As directed    If you experience chest pain or shortness of breath, CALL 911  and be transported to the hospital emergency room.  If you develope a fever above 101 F, pus (white drainage) or increased drainage or redness at the wound, or calf pain, call your surgeon's office.    Constipation Prevention   Complete by: As directed    Drink plenty of fluids.  Prune juice may be helpful.  You may use a stool softener, such as Colace (over the counter) 100 mg twice a day.  Use MiraLax (over the counter) for constipation as needed.   Diet - low sodium heart healthy   Complete by: As directed    Discharge instructions   Complete by: As directed    You may shower, dressing is waterproof.  Do not bathe or soak the operative shoulder in a tub, pool.  Use the CPM machine 3 times a day for at least one hour each time.  No lifting with the operative shoulder. Continue use of the sling, you don't have to sleep in it.  Follow-up with Dr. Marlou Sa in ~2 weeks on your given appointment date.  We will remove your adhesive bandage at that time.   Increase activity slowly as tolerated   Complete by: As directed       Follow-up Information    [facility] Follow up.   Specialty: Home Health Services Why: For your Home Health needs; You will receive a call to discuss scheduling Contact information: [place] [address] [phone]            Signed: Donella Stade 09/20/2019, 2:19 PM

## 2019-09-21 DIAGNOSIS — G4730 Sleep apnea, unspecified: Secondary | ICD-10-CM | POA: Diagnosis not present

## 2019-09-21 DIAGNOSIS — E785 Hyperlipidemia, unspecified: Secondary | ICD-10-CM | POA: Diagnosis not present

## 2019-09-21 DIAGNOSIS — Z96612 Presence of left artificial shoulder joint: Secondary | ICD-10-CM | POA: Diagnosis not present

## 2019-09-21 DIAGNOSIS — J439 Emphysema, unspecified: Secondary | ICD-10-CM | POA: Diagnosis not present

## 2019-09-21 DIAGNOSIS — M5136 Other intervertebral disc degeneration, lumbar region: Secondary | ICD-10-CM | POA: Diagnosis not present

## 2019-09-21 DIAGNOSIS — G2581 Restless legs syndrome: Secondary | ICD-10-CM | POA: Diagnosis not present

## 2019-09-21 DIAGNOSIS — Z471 Aftercare following joint replacement surgery: Secondary | ICD-10-CM | POA: Diagnosis not present

## 2019-09-21 DIAGNOSIS — Z9181 History of falling: Secondary | ICD-10-CM | POA: Diagnosis not present

## 2019-09-21 DIAGNOSIS — G4700 Insomnia, unspecified: Secondary | ICD-10-CM | POA: Diagnosis not present

## 2019-09-21 DIAGNOSIS — E1122 Type 2 diabetes mellitus with diabetic chronic kidney disease: Secondary | ICD-10-CM | POA: Diagnosis not present

## 2019-09-21 DIAGNOSIS — N183 Chronic kidney disease, stage 3 unspecified: Secondary | ICD-10-CM | POA: Diagnosis not present

## 2019-09-21 DIAGNOSIS — I129 Hypertensive chronic kidney disease with stage 1 through stage 4 chronic kidney disease, or unspecified chronic kidney disease: Secondary | ICD-10-CM | POA: Diagnosis not present

## 2019-09-21 DIAGNOSIS — G894 Chronic pain syndrome: Secondary | ICD-10-CM | POA: Diagnosis not present

## 2019-09-21 DIAGNOSIS — M4316 Spondylolisthesis, lumbar region: Secondary | ICD-10-CM | POA: Diagnosis not present

## 2019-09-21 DIAGNOSIS — M549 Dorsalgia, unspecified: Secondary | ICD-10-CM | POA: Diagnosis not present

## 2019-09-21 DIAGNOSIS — E039 Hypothyroidism, unspecified: Secondary | ICD-10-CM | POA: Diagnosis not present

## 2019-09-21 DIAGNOSIS — K219 Gastro-esophageal reflux disease without esophagitis: Secondary | ICD-10-CM | POA: Diagnosis not present

## 2019-09-22 ENCOUNTER — Ambulatory Visit (INDEPENDENT_AMBULATORY_CARE_PROVIDER_SITE_OTHER): Payer: Medicare Other | Admitting: Orthopedic Surgery

## 2019-09-22 ENCOUNTER — Other Ambulatory Visit: Payer: Self-pay

## 2019-09-22 ENCOUNTER — Ambulatory Visit (INDEPENDENT_AMBULATORY_CARE_PROVIDER_SITE_OTHER): Payer: Medicare Other

## 2019-09-22 ENCOUNTER — Encounter: Payer: Self-pay | Admitting: Orthopedic Surgery

## 2019-09-22 VITALS — Ht 64.0 in | Wt 151.0 lb

## 2019-09-22 DIAGNOSIS — M19012 Primary osteoarthritis, left shoulder: Secondary | ICD-10-CM | POA: Diagnosis not present

## 2019-09-22 DIAGNOSIS — G8929 Other chronic pain: Secondary | ICD-10-CM | POA: Diagnosis not present

## 2019-09-22 DIAGNOSIS — M545 Low back pain: Secondary | ICD-10-CM | POA: Diagnosis not present

## 2019-09-22 DIAGNOSIS — M25511 Pain in right shoulder: Secondary | ICD-10-CM | POA: Diagnosis not present

## 2019-09-22 DIAGNOSIS — Z79891 Long term (current) use of opiate analgesic: Secondary | ICD-10-CM | POA: Diagnosis not present

## 2019-09-22 DIAGNOSIS — G894 Chronic pain syndrome: Secondary | ICD-10-CM | POA: Diagnosis not present

## 2019-09-22 NOTE — Progress Notes (Signed)
Post-Op Visit Note   Patient: Jill Phillips           Date of Birth: 1956/01/27           MRN: [id number] Visit Date: 09/22/2019 PCP: [name], MD   Assessment & Plan:  Chief Complaint:  Chief Complaint  Patient presents with  . Left Shoulder - Routine Post Op    09/09/2019 Left RSA   Visit Diagnoses:  1. Primary osteoarthritis, left shoulder     Plan: Patient is a 64 year old female who is s/p left reverse shoulder arthroplasty on 09/09/2019.  Patient states that she initially had a lot of pain but it is improving.  Her pain is manageable now.  She has been using the CPM machine is up to 90 degrees.  She has been doing home health physical therapy since surgery.  She has been compliant with taking aspirin.  She is receiving her pain medicine from pain management and has an appointment with them this afternoon for further care.  On exam her incision is healing well.  She has about 90 degrees of forward flexion, 80 degrees of abduction, 20 degrees of external rotation.  X-rays of the left shoulder reveal a well-positioned prosthesis with no complicating features.  Plan to begin outpatient physical therapy starting next week to focus on left shoulder range of motion and then transition to working on strength.  She will continue using CPM machine in the meantime.  Patient agrees with plan and will follow up with the office in 4 weeks.  Follow-Up Instructions: No follow-ups on file.   Orders:  Orders Placed This Encounter  Procedures  . XR Shoulder Left  . Ambulatory referral to Physical Therapy   No orders of the defined types were placed in this encounter.   Imaging: No results found.  PMFS History: Patient Active Problem List   Diagnosis Date Noted  . Status post shoulder surgery 09/09/2019  . Nausea 08/19/2019  . Anxiety 05/04/2019  . Insomnia 05/04/2019  . Headache 12/08/2018  . Syncope 12/08/2018  . COPD with exacerbation ([place]) 11/26/2018  . Disorder of rotator  cuff syndrome of left shoulder and allied disorder 06/22/2018  . Leg edema, left 06/04/2018  . Type 2 diabetes mellitus without complication, without long-term current use of insulin ([place]) 12/02/2017  . Sacral pain 09/17/2017  . COPD (chronic obstructive pulmonary disease) ([place]) 04/21/2017  . Lower back pain 03/03/2017  . CKD (chronic kidney disease) stage 3, GFR 30-59 ml/min 08/07/2016  . GERD (gastroesophageal reflux disease) 08/07/2016  . Chronic respiratory failure ([place]) 07/29/2016  . Arthritis of carpometacarpal [facility]) joint of left thumb 07/09/2016  . Pneumothorax on left 01/31/2016  . Chronic, continuous use of opioids 09/06/2015  . Degenerative disc disease, lumbar 08/01/2015    Class: Chronic  . Spondylolisthesis of lumbar region 08/01/2015    Class: Chronic  . Urinary, incontinence, stress female 05/06/2014  . Constipation 05/05/2014  . 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 HTN (hypertension) 05/19/2013  . HLD (hyperlipidemia) 05/19/2013  . Depression 05/19/2013  . Hypothyroidism 05/19/2013  . Tobacco abuse   . Osteoarthritis of both knees 11/18/2011  . RLS (restless legs syndrome) 11/18/2011   Past Medical History:  Diagnosis Date  . Active smoker   . Anxiety   . Calcifying tendinitis of shoulder   . Chronic back pain   . Chronic pain syndrome   . COPD (chronic obstructive pulmonary disease) ([place])   . Dysthymic disorder   . Emphysema lung ([place])   .  GERD (gastroesophageal reflux disease)   . Headache    "weekly maybe" (01/31/2016)  . Heart murmur    for years, nothing to be concerned about  . Herpes genitalia   . History of blood transfusion 1980   related to "back surgery"  . Hyperlipidemia   . Hypertension   . Hypothyroidism   . Lumbago   . Osteoarthrosis, unspecified whether generalized or localized, lower leg   . Pain in joint, upper arm   . Pneumothorax, left 01/31/2016   S/P Left posterior subcostal pain injection on 01/30/2016  . PONV (postoperative nausea and  vomiting)    gets nauseous  with longer surgery. Difficuty voiding after surgery  . Postlaminectomy syndrome, thoracic region   . Primary localized osteoarthrosis, lower leg   . Restless leg syndrome   . Sleep apnea    s/p surgery- last sleep study 2011- doesnt use oxygen or machine at night as instructed,.   12/2014- Dr Halford Chessman  reports it is negative.  . Thyroid disease     Family History  Problem Relation Age of Onset  . Kidney disease Mother   . Heart disease Father   . Anuerysm Brother 29       brain  . Heart disease Brother   . Heart disease Sister 25       s/p CABG  . Hypertension Sister   . Colon cancer Neg Hx     Past Surgical History:  Procedure Laterality Date  . APPENDECTOMY    . BACK SURGERY     18 back surgeries (2 thoracic & 16 lumbar) (01/31/2016)  . DILATION AND CURETTAGE OF UTERUS    . HAMMER TOE SURGERY    . IR RADIOLOGIST EVAL & MGMT  07/21/2017  . JOINT REPLACEMENT    . KNEE ARTHROSCOPY Right   . LAPAROSCOPIC CHOLECYSTECTOMY    . LUMBAR FUSION N/A 08/01/2015   Procedure: Right sided L1-2 and L2-3 transforaminal lumbar interbody fusion with cages, Extension of posterior fusion T12 to L3, Replaced pedicle screws bilaterally L1-L2 , Replaced left sided pedicle screws L-3. Instrumentation T12 to L3 using local bone graft, Vivigen allograft and cancellous chips;  Surgeon: [name], MD;  Location: [facility];  Service: Orthopedics;  Laterality: N/A;  . LUMBAR LAMINECTOMY/DECOMPRESSION MICRODISCECTOMY N/A 01/28/2014   Procedure: Minimally Invasive Right  L1-2 Microdiscectomy;  Surgeon: [name], MD;  Location: [facility];  Service: Orthopedics;  Laterality: N/A;  . REVERSE SHOULDER ARTHROPLASTY Left 09/09/2019   Procedure: LEFT REVERSE SHOULDER REPLACEMENT;  Surgeon: Meredith Pel, MD;  Location: [facility];  Service: Orthopedics;  Laterality: Left;  . TOTAL HIP ARTHROPLASTY Right   . TOTAL KNEE ARTHROPLASTY  05/29/2012   Procedure: TOTAL KNEE ARTHROPLASTY;  Surgeon:  Mcarthur Rossetti, MD;  Location: [facility];  Service: Orthopedics;  Laterality: Right;  Right Total Knee Arthroplasty  . TUBAL LIGATION    . UVULOPALATOPHARYNGOPLASTY    . VAGINAL HYSTERECTOMY     Social History   Occupational History  . Occupation: disability    Comment: back surgeries  Tobacco Use  . Smoking status: Current Every Day Smoker    Packs/day: 1.25    Years: 45.00    Pack years: 56.25    Types: Cigarettes  . Smokeless tobacco: Never Used  . Tobacco comment: form given 12/25/16  Substance and Sexual Activity  . Alcohol use: No    Alcohol/week: 0.0 standard drinks  . Drug use: No    Types: Marijuana    Comment:  01/31/2016 "none since ~ 1980"  . Sexual activity: Never    Partners: Male

## 2019-09-24 ENCOUNTER — Encounter: Payer: Self-pay | Admitting: Orthopedic Surgery

## 2019-09-27 DIAGNOSIS — M549 Dorsalgia, unspecified: Secondary | ICD-10-CM | POA: Diagnosis not present

## 2019-09-27 DIAGNOSIS — Z96612 Presence of left artificial shoulder joint: Secondary | ICD-10-CM | POA: Diagnosis not present

## 2019-09-27 DIAGNOSIS — Z471 Aftercare following joint replacement surgery: Secondary | ICD-10-CM | POA: Diagnosis not present

## 2019-09-27 DIAGNOSIS — G894 Chronic pain syndrome: Secondary | ICD-10-CM | POA: Diagnosis not present

## 2019-09-27 DIAGNOSIS — G2581 Restless legs syndrome: Secondary | ICD-10-CM | POA: Diagnosis not present

## 2019-09-27 DIAGNOSIS — E1122 Type 2 diabetes mellitus with diabetic chronic kidney disease: Secondary | ICD-10-CM | POA: Diagnosis not present

## 2019-09-27 DIAGNOSIS — K219 Gastro-esophageal reflux disease without esophagitis: Secondary | ICD-10-CM | POA: Diagnosis not present

## 2019-09-27 DIAGNOSIS — E039 Hypothyroidism, unspecified: Secondary | ICD-10-CM | POA: Diagnosis not present

## 2019-09-27 DIAGNOSIS — Z9181 History of falling: Secondary | ICD-10-CM | POA: Diagnosis not present

## 2019-09-27 DIAGNOSIS — G4730 Sleep apnea, unspecified: Secondary | ICD-10-CM | POA: Diagnosis not present

## 2019-09-27 DIAGNOSIS — G4700 Insomnia, unspecified: Secondary | ICD-10-CM | POA: Diagnosis not present

## 2019-09-27 DIAGNOSIS — J439 Emphysema, unspecified: Secondary | ICD-10-CM | POA: Diagnosis not present

## 2019-09-27 DIAGNOSIS — M5136 Other intervertebral disc degeneration, lumbar region: Secondary | ICD-10-CM | POA: Diagnosis not present

## 2019-09-27 DIAGNOSIS — M4316 Spondylolisthesis, lumbar region: Secondary | ICD-10-CM | POA: Diagnosis not present

## 2019-09-27 DIAGNOSIS — E785 Hyperlipidemia, unspecified: Secondary | ICD-10-CM | POA: Diagnosis not present

## 2019-09-27 DIAGNOSIS — I129 Hypertensive chronic kidney disease with stage 1 through stage 4 chronic kidney disease, or unspecified chronic kidney disease: Secondary | ICD-10-CM | POA: Diagnosis not present

## 2019-09-27 DIAGNOSIS — N183 Chronic kidney disease, stage 3 unspecified: Secondary | ICD-10-CM | POA: Diagnosis not present

## 2019-09-29 ENCOUNTER — Telehealth: Payer: Self-pay | Admitting: Orthopedic Surgery

## 2019-09-29 NOTE — Telephone Encounter (Signed)
Patient called.   On January 7th, she had surgery done on her left shoulder. She is experiencing extreme discomfort and would like advisement.   Call back: [phone]

## 2019-09-29 NOTE — Telephone Encounter (Signed)
Called patient and received voicemail.  Will attempt to call again later today

## 2019-09-29 NOTE — Telephone Encounter (Signed)
Please advise. Thanks.  

## 2019-09-30 DIAGNOSIS — E039 Hypothyroidism, unspecified: Secondary | ICD-10-CM | POA: Diagnosis not present

## 2019-09-30 DIAGNOSIS — Z471 Aftercare following joint replacement surgery: Secondary | ICD-10-CM | POA: Diagnosis not present

## 2019-09-30 DIAGNOSIS — E1122 Type 2 diabetes mellitus with diabetic chronic kidney disease: Secondary | ICD-10-CM | POA: Diagnosis not present

## 2019-09-30 DIAGNOSIS — Z96612 Presence of left artificial shoulder joint: Secondary | ICD-10-CM | POA: Diagnosis not present

## 2019-09-30 DIAGNOSIS — I129 Hypertensive chronic kidney disease with stage 1 through stage 4 chronic kidney disease, or unspecified chronic kidney disease: Secondary | ICD-10-CM | POA: Diagnosis not present

## 2019-09-30 DIAGNOSIS — G894 Chronic pain syndrome: Secondary | ICD-10-CM | POA: Diagnosis not present

## 2019-09-30 DIAGNOSIS — M4316 Spondylolisthesis, lumbar region: Secondary | ICD-10-CM | POA: Diagnosis not present

## 2019-09-30 DIAGNOSIS — G4730 Sleep apnea, unspecified: Secondary | ICD-10-CM | POA: Diagnosis not present

## 2019-09-30 DIAGNOSIS — M5136 Other intervertebral disc degeneration, lumbar region: Secondary | ICD-10-CM | POA: Diagnosis not present

## 2019-09-30 DIAGNOSIS — E785 Hyperlipidemia, unspecified: Secondary | ICD-10-CM | POA: Diagnosis not present

## 2019-09-30 DIAGNOSIS — G2581 Restless legs syndrome: Secondary | ICD-10-CM | POA: Diagnosis not present

## 2019-09-30 DIAGNOSIS — N183 Chronic kidney disease, stage 3 unspecified: Secondary | ICD-10-CM | POA: Diagnosis not present

## 2019-09-30 DIAGNOSIS — G4700 Insomnia, unspecified: Secondary | ICD-10-CM | POA: Diagnosis not present

## 2019-09-30 DIAGNOSIS — M549 Dorsalgia, unspecified: Secondary | ICD-10-CM | POA: Diagnosis not present

## 2019-09-30 DIAGNOSIS — Z9181 History of falling: Secondary | ICD-10-CM | POA: Diagnosis not present

## 2019-09-30 DIAGNOSIS — J439 Emphysema, unspecified: Secondary | ICD-10-CM | POA: Diagnosis not present

## 2019-09-30 DIAGNOSIS — K219 Gastro-esophageal reflux disease without esophagitis: Secondary | ICD-10-CM | POA: Diagnosis not present

## 2019-10-04 DIAGNOSIS — E1122 Type 2 diabetes mellitus with diabetic chronic kidney disease: Secondary | ICD-10-CM | POA: Diagnosis not present

## 2019-10-04 DIAGNOSIS — N183 Chronic kidney disease, stage 3 unspecified: Secondary | ICD-10-CM | POA: Diagnosis not present

## 2019-10-04 DIAGNOSIS — Z471 Aftercare following joint replacement surgery: Secondary | ICD-10-CM | POA: Diagnosis not present

## 2019-10-04 DIAGNOSIS — G894 Chronic pain syndrome: Secondary | ICD-10-CM | POA: Diagnosis not present

## 2019-10-04 DIAGNOSIS — J439 Emphysema, unspecified: Secondary | ICD-10-CM | POA: Diagnosis not present

## 2019-10-04 DIAGNOSIS — G4700 Insomnia, unspecified: Secondary | ICD-10-CM | POA: Diagnosis not present

## 2019-10-04 DIAGNOSIS — I129 Hypertensive chronic kidney disease with stage 1 through stage 4 chronic kidney disease, or unspecified chronic kidney disease: Secondary | ICD-10-CM | POA: Diagnosis not present

## 2019-10-04 DIAGNOSIS — Z96612 Presence of left artificial shoulder joint: Secondary | ICD-10-CM | POA: Diagnosis not present

## 2019-10-04 DIAGNOSIS — M549 Dorsalgia, unspecified: Secondary | ICD-10-CM | POA: Diagnosis not present

## 2019-10-04 DIAGNOSIS — G2581 Restless legs syndrome: Secondary | ICD-10-CM | POA: Diagnosis not present

## 2019-10-04 DIAGNOSIS — E785 Hyperlipidemia, unspecified: Secondary | ICD-10-CM | POA: Diagnosis not present

## 2019-10-04 DIAGNOSIS — E039 Hypothyroidism, unspecified: Secondary | ICD-10-CM | POA: Diagnosis not present

## 2019-10-04 DIAGNOSIS — Z9181 History of falling: Secondary | ICD-10-CM | POA: Diagnosis not present

## 2019-10-04 DIAGNOSIS — M4316 Spondylolisthesis, lumbar region: Secondary | ICD-10-CM | POA: Diagnosis not present

## 2019-10-04 DIAGNOSIS — G4730 Sleep apnea, unspecified: Secondary | ICD-10-CM | POA: Diagnosis not present

## 2019-10-04 DIAGNOSIS — M5136 Other intervertebral disc degeneration, lumbar region: Secondary | ICD-10-CM | POA: Diagnosis not present

## 2019-10-04 DIAGNOSIS — K219 Gastro-esophageal reflux disease without esophagitis: Secondary | ICD-10-CM | POA: Diagnosis not present

## 2019-10-06 ENCOUNTER — Telehealth: Payer: Self-pay | Admitting: Orthopedic Surgery

## 2019-10-06 NOTE — Telephone Encounter (Signed)
Liliane Channel with [facility] called in wanting to let dr.dean know the pt had a missed visit for therapy today 10/06/19. Any questions give him a call.   [phone]

## 2019-10-06 NOTE — Telephone Encounter (Signed)
FYI

## 2019-10-07 NOTE — Telephone Encounter (Signed)
hmm

## 2019-10-08 DIAGNOSIS — G2581 Restless legs syndrome: Secondary | ICD-10-CM | POA: Diagnosis not present

## 2019-10-08 DIAGNOSIS — G4700 Insomnia, unspecified: Secondary | ICD-10-CM | POA: Diagnosis not present

## 2019-10-08 DIAGNOSIS — K219 Gastro-esophageal reflux disease without esophagitis: Secondary | ICD-10-CM | POA: Diagnosis not present

## 2019-10-08 DIAGNOSIS — E785 Hyperlipidemia, unspecified: Secondary | ICD-10-CM | POA: Diagnosis not present

## 2019-10-08 DIAGNOSIS — Z471 Aftercare following joint replacement surgery: Secondary | ICD-10-CM | POA: Diagnosis not present

## 2019-10-08 DIAGNOSIS — E1122 Type 2 diabetes mellitus with diabetic chronic kidney disease: Secondary | ICD-10-CM | POA: Diagnosis not present

## 2019-10-08 DIAGNOSIS — M4316 Spondylolisthesis, lumbar region: Secondary | ICD-10-CM | POA: Diagnosis not present

## 2019-10-08 DIAGNOSIS — G894 Chronic pain syndrome: Secondary | ICD-10-CM | POA: Diagnosis not present

## 2019-10-08 DIAGNOSIS — N183 Chronic kidney disease, stage 3 unspecified: Secondary | ICD-10-CM | POA: Diagnosis not present

## 2019-10-08 DIAGNOSIS — G4730 Sleep apnea, unspecified: Secondary | ICD-10-CM | POA: Diagnosis not present

## 2019-10-08 DIAGNOSIS — Z96612 Presence of left artificial shoulder joint: Secondary | ICD-10-CM | POA: Diagnosis not present

## 2019-10-08 DIAGNOSIS — Z9181 History of falling: Secondary | ICD-10-CM | POA: Diagnosis not present

## 2019-10-08 DIAGNOSIS — I129 Hypertensive chronic kidney disease with stage 1 through stage 4 chronic kidney disease, or unspecified chronic kidney disease: Secondary | ICD-10-CM | POA: Diagnosis not present

## 2019-10-08 DIAGNOSIS — J439 Emphysema, unspecified: Secondary | ICD-10-CM | POA: Diagnosis not present

## 2019-10-08 DIAGNOSIS — E039 Hypothyroidism, unspecified: Secondary | ICD-10-CM | POA: Diagnosis not present

## 2019-10-08 DIAGNOSIS — M549 Dorsalgia, unspecified: Secondary | ICD-10-CM | POA: Diagnosis not present

## 2019-10-08 DIAGNOSIS — M5136 Other intervertebral disc degeneration, lumbar region: Secondary | ICD-10-CM | POA: Diagnosis not present

## 2019-10-11 ENCOUNTER — Telehealth: Payer: Self-pay | Admitting: Orthopedic Surgery

## 2019-10-11 ENCOUNTER — Ambulatory Visit: Payer: Medicare Other | Admitting: Physical Therapy

## 2019-10-11 ENCOUNTER — Other Ambulatory Visit: Payer: Self-pay | Admitting: Internal Medicine

## 2019-10-11 NOTE — Telephone Encounter (Signed)
Patient called advised she is running a temp 99.8 and will not be able to go to her appointment for (PT) at [facility]. [phone]  Patient advised her arm is hot to the touch and she is in pain. Patient said the Vicodin is not working. The number to contact is [phone]

## 2019-10-11 NOTE — Telephone Encounter (Signed)
Called and left voicemail message.  Will try and contact patient again later tonight.  Additionally, patient has my contact information and will reach out if I am unable to contact her directly

## 2019-10-11 NOTE — Telephone Encounter (Signed)
Please advise. Thanks.  

## 2019-10-12 ENCOUNTER — Telehealth: Payer: Self-pay | Admitting: Orthopedic Surgery

## 2019-10-12 NOTE — Telephone Encounter (Signed)
Appt at 1045

## 2019-10-12 NOTE — Telephone Encounter (Signed)
Check [place] registry last filled 09/14/2019.Marland KitchenJohny Chess

## 2019-10-12 NOTE — Telephone Encounter (Signed)
Patient called requesting a appointment for tomorrow's date of 10/13/2019. Patient can not make open appointment at 3:30 pm. Patient asked for Dr. Marlou Sa to call patient. Patient phone number is [phone].

## 2019-10-13 ENCOUNTER — Encounter: Payer: Self-pay | Admitting: Orthopedic Surgery

## 2019-10-13 ENCOUNTER — Ambulatory Visit (INDEPENDENT_AMBULATORY_CARE_PROVIDER_SITE_OTHER): Payer: Medicare Other | Admitting: Orthopedic Surgery

## 2019-10-13 ENCOUNTER — Other Ambulatory Visit: Payer: Self-pay

## 2019-10-13 DIAGNOSIS — M19012 Primary osteoarthritis, left shoulder: Secondary | ICD-10-CM

## 2019-10-13 NOTE — Progress Notes (Signed)
Post-Op Visit Note   Patient: Jill Phillips           Date of Birth: [birth year]           MRN: [id number] Visit Date: 10/13/2019 PCP: [name], MD   Assessment & Plan:  Chief Complaint:  Chief Complaint  Patient presents with  . Follow-up   Visit Diagnoses:  1. Primary osteoarthritis, left shoulder     Plan: Jill Phillips is now about a month out left reverse shoulder replacement.  On exam there was a question about the incision having some redness.  On exam today there is no axillary lymphadenopathy and the incision actually looks pretty reasonable.  Just the expected amount of firmness as the incision heals.  No fluctuance no erythema no redness no purulence.  Shoulder moves passively very nicely.  Plan at this time is to continue with therapy and come back in a month.  No indication for any type of aspiration antibiotics or further work-up with laboratory work-up at this time.  She is not having any fevers or chills.  Shoulder itself is not warm.  Follow-Up Instructions: Return in about 4 weeks (around 11/10/2019).   Orders:  No orders of the defined types were placed in this encounter.  No orders of the defined types were placed in this encounter.   Imaging: No results found.  PMFS History: Patient Active Problem List   Diagnosis Date Noted  . Status post shoulder surgery 09/09/2019  . Nausea 08/19/2019  . Anxiety 05/04/2019  . Insomnia 05/04/2019  . Headache 12/08/2018  . Syncope 12/08/2018  . COPD with exacerbation ([place]) 11/26/2018  . Disorder of rotator cuff syndrome of left shoulder and allied disorder 06/22/2018  . Leg edema, left 06/04/2018  . Type 2 diabetes mellitus without complication, without long-term current use of insulin ([place]) 12/02/2017  . Sacral pain 09/17/2017  . COPD (chronic obstructive pulmonary disease) ([place]) 04/21/2017  . Lower back pain 03/03/2017  . CKD (chronic kidney disease) stage 3, GFR 30-59 ml/min 08/07/2016  . GERD (gastroesophageal  reflux disease) 08/07/2016  . Chronic respiratory failure ([place]) 07/29/2016  . Arthritis of carpometacarpal [facility]) joint of left thumb 07/09/2016  . Pneumothorax on left 01/31/2016  . Chronic, continuous use of opioids 09/06/2015  . Degenerative disc disease, lumbar 08/01/2015    Class: Chronic  . Spondylolisthesis of lumbar region 08/01/2015    Class: Chronic  . Urinary, incontinence, stress female 05/06/2014  . Constipation 05/05/2014  . 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 HTN (hypertension) 05/19/2013  . HLD (hyperlipidemia) 05/19/2013  . Depression 05/19/2013  . Hypothyroidism 05/19/2013  . Tobacco abuse   . Osteoarthritis of both knees 11/18/2011  . RLS (restless legs syndrome) 11/18/2011   Past Medical History:  Diagnosis Date  . Active smoker   . Anxiety   . Calcifying tendinitis of shoulder   . Chronic back pain   . Chronic pain syndrome   . COPD (chronic obstructive pulmonary disease) ([place])   . Dysthymic disorder   . Emphysema lung ([facility])   . GERD (gastroesophageal reflux disease)   . Headache    "weekly maybe" (01/31/2016)  . Heart murmur    for years, nothing to be concerned about  . Herpes genitalia   . History of blood transfusion 1980   related to "back surgery"  . Hyperlipidemia   . Hypertension   . Hypothyroidism   . Lumbago   . Osteoarthrosis, unspecified whether generalized or localized, lower leg   . Pain  in joint, upper arm   . Pneumothorax, left 01/31/2016   S/P Left posterior subcostal pain injection on 01/30/2016  . PONV (postoperative nausea and vomiting)    gets nauseous  with longer surgery. Difficuty voiding after surgery  . Postlaminectomy syndrome, thoracic region   . Primary localized osteoarthrosis, lower leg   . Restless leg syndrome   . Sleep apnea    s/p surgery- last sleep study 2011- doesnt use oxygen or machine at night as instructed,.   12/2014- Dr Halford Chessman  reports it is negative.  . Thyroid disease     Family History  Problem Relation Age  of Onset  . Kidney disease Mother   . Heart disease Father   . Anuerysm Brother 29       brain  . Heart disease Brother   . Heart disease Sister 48       s/p CABG  . Hypertension Sister   . Colon cancer Neg Hx     Past Surgical History:  Procedure Laterality Date  . APPENDECTOMY    . BACK SURGERY     18 back surgeries (2 thoracic & 16 lumbar) (01/31/2016)  . DILATION AND CURETTAGE OF UTERUS    . HAMMER TOE SURGERY    . IR RADIOLOGIST EVAL & MGMT  07/21/2017  . JOINT REPLACEMENT    . KNEE ARTHROSCOPY Right   . LAPAROSCOPIC CHOLECYSTECTOMY    . LUMBAR FUSION N/A 08/01/2015   Procedure: Right sided L1-2 and L2-3 transforaminal lumbar interbody fusion with cages, Extension of posterior fusion T12 to L3, Replaced pedicle screws bilaterally L1-L2 , Replaced left sided pedicle screws L-3. Instrumentation T12 to L3 using local bone graft, Vivigen allograft and cancellous chips;  Surgeon: [name], MD;  Location: [place];  Service: Orthopedics;  Laterality: N/A;  . LUMBAR LAMINECTOMY/DECOMPRESSION MICRODISCECTOMY N/A 01/28/2014   Procedure: Minimally Invasive Right  L1-2 Microdiscectomy;  Surgeon: [name], MD;  Location: [place];  Service: Orthopedics;  Laterality: N/A;  . REVERSE SHOULDER ARTHROPLASTY Left 09/09/2019   Procedure: LEFT REVERSE SHOULDER REPLACEMENT;  Surgeon: Meredith Pel, MD;  Location: [place];  Service: Orthopedics;  Laterality: Left;  . TOTAL HIP ARTHROPLASTY Right   . TOTAL KNEE ARTHROPLASTY  05/29/2012   Procedure: TOTAL KNEE ARTHROPLASTY;  Surgeon: Mcarthur Rossetti, MD;  Location: [facility];  Service: Orthopedics;  Laterality: Right;  Right Total Knee Arthroplasty  . TUBAL LIGATION    . UVULOPALATOPHARYNGOPLASTY    . VAGINAL HYSTERECTOMY     Social History   Occupational History  . Occupation: disability    Comment: back surgeries  Tobacco Use  . Smoking status: Current Every Day Smoker    Packs/day: 1.25    Years: 45.00    Pack years: 56.25     Types: Cigarettes  . Smokeless tobacco: Never Used  . Tobacco comment: form given 12/25/16  Substance and Sexual Activity  . Alcohol use: No    Alcohol/week: 0.0 standard drinks  . Drug use: No    Types: Marijuana    Comment: 01/31/2016 "none since ~ 1980"  . Sexual activity: Never    Partners: Male

## 2019-10-14 ENCOUNTER — Telehealth: Payer: Self-pay | Admitting: Internal Medicine

## 2019-10-14 DIAGNOSIS — I10 Essential (primary) hypertension: Secondary | ICD-10-CM

## 2019-10-14 NOTE — Chronic Care Management (AMB) (Signed)
  Chronic Care Management   Note  10/14/2019 Name: Jill Phillips MRN: [id number] DOB: [birth year]  Jill Phillips is a 64 y.o. year old female who is a primary care patient of Burns, Claudina Lick, MD. I reached out to [name] by phone today in response to a referral sent by Ms. Cresenciano Genre PCP, [name], MD.   Ms. Lessig was given information about Chronic Care Management services today including:  1. CCM service includes personalized support from designated clinical staff supervised by her physician, including individualized plan of care and coordination with other care providers 2. 24/7 contact phone numbers for assistance for urgent and routine care needs. 3. Service will only be billed when office clinical staff spend 20 minutes or more in a month to coordinate care. 4. Only one practitioner may furnish and bill the service in a calendar month. 5. The patient may stop CCM services at any time (effective at the end of the month) by phone call to the office staff. 6. The patient will be responsible for cost sharing (co-pay) of up to 20% of the service fee (after annual deductible is met).  Patient agreed to services and verbal consent obtained.   Follow up plan:   Raynicia Dukes [employer] Scheduler

## 2019-10-18 ENCOUNTER — Other Ambulatory Visit: Payer: Self-pay | Admitting: Internal Medicine

## 2019-10-19 NOTE — Addendum Note (Signed)
Addended by: Karle Barr on: 10/19/2019 05:28 AM   Modules accepted: Orders

## 2019-10-20 ENCOUNTER — Ambulatory Visit: Payer: Medicare Other | Admitting: Orthopedic Surgery

## 2019-10-20 ENCOUNTER — Other Ambulatory Visit: Payer: Self-pay | Admitting: Internal Medicine

## 2019-10-20 ENCOUNTER — Other Ambulatory Visit: Payer: Self-pay

## 2019-10-20 ENCOUNTER — Ambulatory Visit: Payer: Medicare Other | Admitting: Pharmacist

## 2019-10-20 MED ORDER — AMLODIPINE BESY-BENAZEPRIL HCL 10-40 MG PO CAPS: 1.0000 | ORAL_CAPSULE | Freq: Every day | ORAL | 1 refills | Status: DC

## 2019-10-20 NOTE — Patient Instructions (Addendum)
Visit Information  Thank you for meeting with me to discuss your medications! I look forward to working with you to achieve your health care goals. Below is a summary of what we talked about during the visit, as well as some more information about preventing diabetes to add to what we talked about:  Goals Addressed            This Visit's Progress   . Pharmacy Care Plan       Current Barriers:  . Chronic Disease Management support, education, and care coordination needs related to HTN, HLD, COPD, and prediabetes  Pharmacist Clinical Goal(s):  [name] Maintain BP < 130/80 . Maintain LDL (bad cholesterol) < 100 . Prevent progression to diabetes . Ensure safe use of pain medications . Reduce pill burden  Interventions: . Comprehensive medication review performed. . Combine amlodipine and benazepril into one pill once a day . Reduce omeprazole to once daily if heartburn is controlled . Use Spiriva daily to prevent needing rescue inhaler  Patient Self Care Activities:  . Self administers medications as prescribed, Calls pharmacy for medication refills, and Calls provider office for new concerns or questions  Initial goal documentation       Ms. Sigal was given information about Chronic Care Management services today including:  1. CCM service includes personalized support from designated clinical staff supervised by her physician, including individualized plan of care and coordination with other care providers 2. 24/7 contact phone numbers for assistance for urgent and routine care needs. 3. Service will only be billed when office clinical staff spend 20 minutes or more in a month to coordinate care. 4. Only one practitioner may furnish and bill the service in a calendar month. 5. The patient may stop CCM services at any time (effective at the end of the month) by phone call to the office staff. 6. The patient will be responsible for cost sharing (co-pay) of up to 20% of the service  fee (after annual deductible is met).  Patient agreed to services and verbal consent obtained.   Print copy of patient instructions provided.  Telephone follow up appointment with pharmacy team member scheduled for: 12/22/19 @ 11:30am   Charlene Brooke, PharmD Clinical Pharmacist [facility] [phone]     Preventing Type 2 Diabetes Mellitus Type 2 diabetes (type 2 diabetes mellitus) is a long-term (chronic) disease that affects blood sugar (glucose) levels. Normally, a hormone called insulin allows glucose to enter cells in the body. The cells use glucose for energy. In type 2 diabetes, one or both of these problems may be present:  The body does not make enough insulin.  The body does not respond properly to insulin that it makes (insulin resistance). Insulin resistance or lack of insulin causes excess glucose to build up in the blood instead of going into cells. As a result, high blood glucose (hyperglycemia) develops, which can cause many complications. Being overweight or obese and having an inactive (sedentary) lifestyle can increase your risk for diabetes. Type 2 diabetes can be delayed or prevented by making certain nutrition and lifestyle changes. What nutrition changes can be made?   Eat healthy meals and snacks regularly. Keep a healthy snack with you for when you get hungry between meals, such as fruit or a handful of nuts.  Eat lean meats and proteins that are low in saturated fats, such as chicken, fish, egg whites, and beans. Avoid processed meats.  Eat plenty of fruits and vegetables and plenty  of grains that have not been processed (whole grains). It is recommended that you eat: ? 1?2 cups of fruit every day. ? 2?3 cups of vegetables every day. ? 6?8 oz of whole grains every day, such as oats, whole wheat, bulgur, brown rice, quinoa, and millet.  Eat low-fat dairy products, such as milk, yogurt, and cheese.  Eat foods that contain  healthy fats, such as nuts, avocado, olive oil, and canola oil.  Drink water throughout the day. Avoid drinks that contain added sugar, such as soda or sweet tea.  Follow instructions from your health care provider about specific eating or drinking restrictions.  Control how much food you eat at a time (portion size). ? Check food labels to find out the serving sizes of foods. ? Use a kitchen scale to weigh amounts of foods.  Saute or steam food instead of frying it. Cook with water or broth instead of oils or butter.  Limit your intake of: ? Salt (sodium). Have no more than 1 tsp (2,400 mg) of sodium a day. If you have heart disease or high blood pressure, have less than ? tsp (1,500 mg) of sodium a day. ? Saturated fat. This is fat that is solid at room temperature, such as butter or fat on meat. What lifestyle changes can be made? Activity   Do moderate-intensity physical activity for at least 30 minutes on at least 5 days of the week, or as much as told by your health care provider.  Ask your health care provider what activities are safe for you. A mix of physical activities may be best, such as walking, swimming, cycling, and strength training.  Try to add physical activity into your day. For example: ? Park in spots that are farther away than usual, so that you walk more. For example, park in a far corner of the parking lot when you go to the office or the grocery store. ? Take a walk during your lunch break. ? Use stairs instead of elevators or escalators. Weight Loss  Lose weight as directed. Your health care provider can determine how much weight loss is best for you and can help you lose weight safely.  If you are overweight or obese, you may be instructed to lose at least 5?7 % of your body weight. Alcohol and Tobacco   Limit alcohol intake to no more than 1 drink a day for nonpregnant women and 2 drinks a day for men. One drink equals 12 oz of beer, 5 oz of wine, or  1 oz of hard liquor.  Do not use any tobacco products, such as cigarettes, chewing tobacco, and e-cigarettes. If you need help quitting, ask your health care provider. Work With [facility] Provider  Have your blood glucose tested regularly, as told by your health care provider.  Discuss your risk factors and how you can reduce your risk for diabetes.  Get screening tests as told by your health care provider. You may have screening tests regularly, especially if you have certain risk factors for type 2 diabetes.  Make an appointment with a diet and nutrition specialist (registered dietitian). A registered dietitian can help you make a healthy eating plan and can help you understand portion sizes and food labels. Why are these changes important?  It is possible to prevent or delay type 2 diabetes and related health problems by making lifestyle and nutrition changes.  It can be difficult to recognize signs of type 2 diabetes. The best way  to avoid possible damage to your body is to take actions to prevent the disease before you develop symptoms. What can happen if changes are not made?  Your blood glucose levels may keep increasing. Having high blood glucose for a long time is dangerous. Too much glucose in your blood can damage your blood vessels, heart, kidneys, nerves, and eyes.  You may develop prediabetes or type 2 diabetes. Type 2 diabetes can lead to many chronic health problems and complications, such as: ? Heart disease. ? Stroke. ? Blindness. ? Kidney disease. ? Depression. ? Poor circulation in the feet and legs, which could lead to surgical removal (amputation) in severe cases. Where to find support  Ask your health care provider to recommend a registered dietitian, diabetes educator, or weight loss program.  Look for local or online weight loss groups.  Join a gym, fitness club, or outdoor activity group, such as a walking club. Where to find more information To  learn more about diabetes and diabetes prevention, visit:  American Diabetes Association (ADA): www.diabetes.[employer] of Diabetes and Digestive and Kidney Diseases: FindSpin.nl To learn more about healthy eating, visit:  The U.S. Department of Agriculture Scientist, research (physical sciences)), Choose My Plate: http://wiley-williams.com/  Office of Disease Prevention and Health Promotion (ODPHP), Dietary Guidelines: SurferLive.at Summary  You can reduce your risk for type 2 diabetes by increasing your physical activity, eating healthy foods, and losing weight as directed.  Talk with your health care provider about your risk for type 2 diabetes. Ask about any blood tests or screening tests that you need to have. This information is not intended to replace advice given to you by your health care provider. Make sure you discuss any questions you have with your health care provider. Document Revised: 12/11/2018 Document Reviewed: 10/10/2015 Elsevier Patient Education  [place].

## 2019-10-20 NOTE — Chronic Care Management (AMB) (Signed)
Chronic Care Management Pharmacy  Name: Jill Phillips  MRN: [id number] DOB: 11-Sep-1955   Chief Complaint/ HPI  Jill Phillips,  64 y.o. , female presents for their Initial CCCM visit with the clinical pharmacist via (add visit type smart list).  PCP : [name], MD  Their chronic conditions include: HTN, HLD, COPD, T2DM, CKD, GERD, Hypothyroidism, OA knees, DDD, depression/anxiety, insomnia  Office Visits: 08/19/19 Dr Jenny Reichmann OV : acute visit, rx'd zofran for nausea.  06/21/19 Dr Quay Burow OV: pain meds per ortho, looking for new pain mgmt. Increased gabapentin to 400 mg TID. Pt wanted to increase seroquel for sleep, MD declined d/t polypharmacy concerns.  Consult Visit: 10/13/19 Dr Marlou Sa (orthopedic surg): Shoulder surgery 09/09/19.  No issues, RTC 4 wks.  08/31/19 ED visit: shoulder pain.  Medications: Outpatient Encounter Medications as of 10/20/2019  Medication Sig Note  . acetaminophen (TYLENOL) 500 MG tablet Take 1,000 mg by mouth every 6 (six) hours as needed (for headaches.).   [name] albuterol (PROAIR HFA) 108 (90 Base) MCG/ACT inhaler INHALE 1 PUFF INTO THE LUNGS EVERY 6 HOURS AS NEEDED FOR WHEEZING OR SHORTNESS OF BREATH.NEED OFFICE VISIT FOR FURTHER REFILLS (Patient taking differently: Inhale 1 puff into the lungs every 6 (six) hours as needed for wheezing or shortness of breath. )   . amLODipine (NORVASC) 10 MG tablet TAKE 1 TABLET BY MOUTH DAILY. (Patient taking differently: Take 10 mg by mouth daily. )   . atorvastatin (LIPITOR) 20 MG tablet TAKE 1 TABLET BY MOUTH DAILY. (Patient taking differently: Take 20 mg by mouth every evening. )   . benazepril (LOTENSIN) 40 MG tablet TAKE 1 TABLET BY MOUTH DAILY. (Patient taking differently: Take 40 mg by mouth daily. )   . Blood Glucose Monitoring Suppl (ONETOUCH VERIO FLEX SYSTEM) w/Device KIT USE TO TEST BLOOD SUGAR UP TO 4 TIMES A DAY   . Cholecalciferol (VITAMIN D3 SUPER STRENGTH) 50 MCG (2000 UT) TABS Take 2,000 Units by mouth  daily.   . cimetidine (TAGAMET) 400 MG tablet Take 400 mg by mouth 2 (two) times daily.   . clonazePAM (KLONOPIN) 1 MG tablet TAKE 1 TABLET BY MOUTH 2 TIMES DAILY AS NEEDED FOR ANXIETY   . docusate sodium (COLACE) 100 MG capsule Take 200 mg by mouth at bedtime.   . fentaNYL (DURAGESIC) 50 MCG/HR PLACE 1 PATCH ONTO THE SKIN EVERY 3 DAYS. 09/01/2019: Patient to begin this dosage 09/08/2019  . gabapentin (NEURONTIN) 400 MG capsule Take 1 capsule (400 mg total) by mouth 3 (three) times daily. (Patient taking differently: Take 800 mg by mouth at bedtime. )   . HYDROcodone-acetaminophen (NORCO) 10-325 MG tablet Take 1-2 tablets by mouth every 6 (six) hours as needed for severe pain.   [name] levothyroxine (SYNTHROID) 88 MCG tablet TAKE 1 TABLET BY MOUTH DAILY.   [name] NARCAN 4 MG/0.1ML LIQD nasal spray kit Place 1 spray into the nose as needed (accidental overdose).    [name] omeprazole (PRILOSEC) 40 MG capsule TAKE 1 CAPSULE BY MOUTH 2 TIMES DAILY BEFORE A MEAL. (Patient taking differently: Take 40 mg by mouth 2 (two) times daily. )   . QUEtiapine (SEROQUEL) 100 MG tablet TAKE 1 TABLET BY MOUTH AT BEDTIME.   [name] Sennosides (SENNA LAX PO) Take 2 tablets by mouth at bedtime.    [name] tiotropium (SPIRIVA) 18 MCG inhalation capsule Place 18 mcg into inhaler and inhale daily.   [name] tiZANidine (ZANAFLEX) 4 MG tablet Take 4 mg by mouth 3 (three) times  daily.   . valACYclovir (VALTREX) 500 MG tablet TAKE 1 TABLET BY MOUTH DAILY. (Patient taking differently: Take 500 mg by mouth at bedtime. )   . venlafaxine XR (EFFEXOR-XR) 150 MG 24 hr capsule TAKE 2 CAPSULES BY MOUTH DAILY. (Patient taking differently: Take 300 mg by mouth daily. )   . aspirin EC 81 MG tablet Take 1 tablet (81 mg total) by mouth 2 (two) times daily. (Patient not taking: Reported on 10/20/2019)   . benzoyl peroxide (BENZOYL PEROXIDE) 5 % external liquid Wash affected shoulder daily with wash for 3 days prior to surgery   . methocarbamol (ROBAXIN) 500 MG tablet Take 1  tablet (500 mg total) by mouth every 8 (eight) hours as needed for muscle spasms.   [name] oxyCODONE-acetaminophen (PERCOCET) 5-325 MG tablet Take 1 tablet by mouth every 6 (six) hours as needed for severe pain.   . promethazine (PHENERGAN) 25 MG tablet Take 1 tablet (25 mg total) by mouth every 8 (eight) hours as needed for nausea or vomiting. (Patient not taking: Reported on 10/20/2019)   . rOPINIRole (REQUIP) 0.5 MG tablet TAKE 2 TABLETS BY MOUTH AT BEDTIME. (Patient taking differently: Take 0.5 mg by mouth every evening. )   . traMADol (ULTRAM) 50 MG tablet    . [DISCONTINUED] QUEtiapine (SEROQUEL) 100 MG tablet TAKE 1 TABLET BY MOUTH AT BEDTIME. (Patient taking differently: Take 100 mg by mouth at bedtime. )    Facility-Administered Encounter Medications as of 10/20/2019  Medication  . calcitonin (salmon) (MIACALCIN/FORTICAL) nasal spray 1 spray     Current Diagnosis/Assessment:  Goals Addressed            This Visit's Progress   . Pharmacy Care Plan       Current Barriers:  . Chronic Disease Management support, education, and care coordination needs related to HTN, HLD, COPD, and prediabetes  Pharmacist Clinical Goal(s):  [name] Maintain BP < 130/80 . Maintain LDL (bad cholesterol) < 100 . Prevent progression to diabetes . Ensure safe use of pain medications . Reduce pill burden  Interventions: . Comprehensive medication review performed. . Combine amlodipine and benazepril into one pill once a day . Reduce omeprazole to once daily if heartburn is controlled . Use Spiriva daily to prevent need for rescue inhaler  Patient Self Care Activities:  . Self administers medications as prescribed, Calls pharmacy for medication refills, and Calls provider office for new concerns or questions  Initial goal documentation        COPD / Asthma / Tobacco   Last spirometry score: FEV1 88% predicted, FEV/FVC 0.86  Eosinophil count:   Lab Results  Component Value Date/Time   EOSPCT 0  12/03/2018 12:42 AM  %                               Eos (Absolute):  Lab Results  Component Value Date/Time   EOSABS 0.0 12/03/2018 12:42 AM   Tobacco Status:  Social History   Tobacco Use  Smoking Status Current Every Day Smoker  . Packs/day: 1.25  . Years: 45.00  . Pack years: 56.25  . Types: Cigarettes  Smokeless Tobacco Never Used  Tobacco Comment   form given 12/25/16   Nervous/anxious she needs to use it. 1-2x/week Patient has failed these meds in past: n/a Patient is currently controlled on the following medications: albuterol MDI, Spiriva  Using maintenance inhaler regularly? No Frequency of rescue inhaler use:  1-2x per week  We discussed:  proper inhaler technique, smoking cessation. Pt is seeing PCP on Monday to discuss starting Chantix. Counsled to use Spiriva daily to reduce need for rescue inhaler.  Plan  Continue current medications  F/U with PCP for Chantix   Prediabetes   Recent Relevant Labs: Lab Results  Component Value Date/Time   HGBA1C 6.3 11/03/2018 03:18 PM   HGBA1C 6.4 06/04/2018 11:50 AM    No medications indicated.  Last diabetic eye exam:  Lab Results  Component Value Date/Time   HMDIABEYEEXA No Retinopathy 12/09/2017 12:31 PM   We discussed: what prediabetes means, what A1c means, how to prevent progression to DM. Discussed which foods are high in carbs and sugar that contribute to DM.  Plan  Continue control with diet and exercise    Hypertension   Office blood pressures are  BP Readings from Last 3 Encounters:  09/10/19 118/88  09/07/19 139/73  08/01/19 137/87   Patient is currently controlled on the following medications: amlodipine 10 mg daily, benazepril 40 mg daily  Patient checks BP at home infrequently  Patient home BP readings are ranging: ~130/80  We discussed BP goals, importance of controlling BP for heart disease prevention. Also discussed combining amlodipine and benazepril to reduce pill burden, pt  would like to if insurance pays.  Plan  Continue control with diet and exercise  Change to amlodipine-benazepril combo (Lotrel)   Hyperlipidemia   Lipid Panel     Component Value Date/Time   CHOL 166 11/03/2018 1518   TRIG 158.0 (H) 11/03/2018 1518   HDL 47.60 11/03/2018 1518   CHOLHDL 3 11/03/2018 1518   VLDL 31.6 11/03/2018 1518   LDLCALC 86 11/03/2018 1518   LDLDIRECT 120.0 06/04/2018 1150    ASCVD 10-year risk: 10.3%  Patient has failed these meds in past: n/a Patient is currently controlled on the following medications: atorvastatin 20 mg daily  We discussed: good vs bad cholesterol, LDL goals. Discussed LDL goal of < 70 to prevent heart disease/stroke, provided examples of foods to avoid.  Plan  Continue current medications and control with diet and exercise  Hypothyroidism   TSH  Date Value Ref Range Status  11/03/2018 1.10 0.35 - 4.50 uIU/mL Final     Patient has failed these meds in past: n/a Patient is currently controlled on the following medications: levothyroxine 88 mcg daily  We discussed:  Pt takes levothyroxine at night for many years, since TSH is controlled counseled to continue this same timing.   Plan  Continue current medications  Chronic Pain/Osteoarthritis   Patient has failed these meds in past: tramadol, oxycodone/apap, methocarbamol Patient is currently controlled on the following medications: Fentanyl patch 50 mcg/hr, hydrocodone/APAP 10-325 mg 1-2 tab q6h PRN, Tylenol 1000 mg q6h prn, Narcan PRN, gabapentin 400 mg up to TID, tizanidine 4 mg TID prn  We discussed: patient takes all pain meds exactly as prescribed. Discussed dangers of oversedation and combination of opiods/benzo. Pt does have Narcan at home, she and her daughter (who lives with her) know how and when to use it.  Plan  Continue current medications   Depression/anxiety/insomnia   Patient has failed these meds in past: trazodone (caused heart to race) Patient is  currently controlled on the following medications: quetiapine 100 mg HS, venlafaxine 300 mg daily, clonazepam 1 mg BID prn (for restless leg)  We discussed: pt would like a higher dose of quetiapine, counseled on increased side effects at higher dosages which is why PCP would not increase dose. Pt  reports clonazepam is for RLS, works very well.   Plan  Continue current medications   GERD/GI   Patient has failed these meds in past: n/a Patient is currently controlled on the following medications: omeprazole 40 mg daily, cimetidine 400 mg BID, docusate 200 mg HS, senna 2 tab HS  We discussed: patient reports she takes cimetidine for "fissures", not for heartburn. When asked if she means ulcers, pt denies and repeats fissure.  She describes wound on her backside that sometimes causes severe pain. She takes an extra cimetidine when pain is bad and thinks it helps a little. She reports she used to take a sample med that worked really well, insurance would not cover and she can't remember the name but would like to try getting that again.  Per med history the only Brand name options she's been prescribed for similar/GI issues are Amitiza and Movantik, both treat constipation not "fissures"  Pt takes omeprazole BID for a "while", she denies symptoms of heartburn. Suggested reducing to once daily and see if she has symptoms.  Plan  Continue current medications  Reduce omeprazole to once daily Investigate Rx option for fissure/sore   Health Maintenance/Misc   Patient is currently controlled on the following medications: valacyclovir 500 mg HS (HSV), Vitamin D3 2000 IU daily  We discussed: pt endorses compliance, denies issues  Plan  Continue current medications  Medication Management   Pt uses [facility] for all medications Pt uses pill box, sets up herself each week with maintenance meds. Pain meds are excluded from this Pt endorses 99% compliance - misses a dose maybe  once/month  We discussed:  Benefits of medication synchronization, pill packaging and delivery with [facility]. Pt would like to discuss with daughter and will let me know next week if she wants to switch.  Plan  Continue current medication mgmt strategy F/u next week about pharmacy choice    Follow up: 2 month phone visit   Charlene Brooke, PharmD Clinical Pharmacist [facility] [phone]

## 2019-10-20 NOTE — Progress Notes (Signed)
  I have reviewed this encounter including the documentation in this note and/or discussed this patient with the care management provider. I am certifying that I agree with the content of this note as supervising physician.  [name], MD   10/20/2019

## 2019-10-22 ENCOUNTER — Other Ambulatory Visit: Payer: Self-pay | Admitting: Internal Medicine

## 2019-10-22 DIAGNOSIS — G8929 Other chronic pain: Secondary | ICD-10-CM | POA: Diagnosis not present

## 2019-10-22 DIAGNOSIS — Z79891 Long term (current) use of opiate analgesic: Secondary | ICD-10-CM | POA: Diagnosis not present

## 2019-10-22 DIAGNOSIS — G894 Chronic pain syndrome: Secondary | ICD-10-CM | POA: Diagnosis not present

## 2019-10-22 DIAGNOSIS — M25511 Pain in right shoulder: Secondary | ICD-10-CM | POA: Diagnosis not present

## 2019-10-22 DIAGNOSIS — M545 Low back pain: Secondary | ICD-10-CM | POA: Diagnosis not present

## 2019-10-25 ENCOUNTER — Ambulatory Visit: Payer: Medicare Other | Attending: Orthopedic Surgery | Admitting: Physical Therapy

## 2019-10-25 ENCOUNTER — Ambulatory Visit: Payer: Medicare Other | Admitting: Internal Medicine

## 2019-10-29 ENCOUNTER — Telehealth: Payer: Self-pay | Admitting: Orthopedic Surgery

## 2019-10-29 NOTE — Telephone Encounter (Signed)
Please advise. Thanks.  

## 2019-10-29 NOTE — Telephone Encounter (Signed)
Patient called stating she fell 10/19/2019 and the pain medicine she is taking is not working. Patient said she is taking Vicodin and Fentanyl. Patient said she fell on her shoulder. The number to contact patient is [phone]

## 2019-10-31 NOTE — Telephone Encounter (Signed)
Called and spoke with patient on 10/30/2019.  Patient reported that she had a fall on 2/19 where she tripped over her ottoman and injured her left shin.  She thinks that she braced her fall with her left arm and since the fall has been having left shoulder pain.  This pain is "not like surgical pain".  She has been taking Vicodin and using her fentanyl patches as she did for her postoperative pain.  She is also been using ice multiple times per day.  She has been able to lift the arm as normal and there is no visible deformity.  When I spoke with her, she states that her pain was improving though it was a small improvement.  I informed patient that her next appointment date was moved up to 3/8 at 2:30 in the afternoon and patient understands.  She will follow up on that date.  She will call the office for sooner appointment if her pain relapses or worsens.

## 2019-11-08 ENCOUNTER — Ambulatory Visit (INDEPENDENT_AMBULATORY_CARE_PROVIDER_SITE_OTHER): Payer: Medicare Other | Admitting: Internal Medicine

## 2019-11-08 ENCOUNTER — Encounter: Payer: Self-pay | Admitting: Internal Medicine

## 2019-11-08 ENCOUNTER — Ambulatory Visit: Payer: Medicare Other | Admitting: Orthopedic Surgery

## 2019-11-08 ENCOUNTER — Other Ambulatory Visit: Payer: Self-pay | Admitting: Internal Medicine

## 2019-11-08 DIAGNOSIS — N3000 Acute cystitis without hematuria: Secondary | ICD-10-CM | POA: Diagnosis not present

## 2019-11-08 MED ORDER — CIPROFLOXACIN HCL 250 MG PO TABS: 250.0000 mg | ORAL_TABLET | Freq: Two times a day (BID) | ORAL | 0 refills | Status: DC

## 2019-11-08 NOTE — Assessment & Plan Note (Signed)
Acute Discussed that we are limited because this is a virtual visit as to whether or not she truly has a UTI or something else is causing her symptoms She states that this is the same symptoms she had with the previous bladder infection We will treat empirically with Cipro even her medication allergies Cipro 250 twice daily x5 days If her symptoms do not improve advised that she will need to provide a urine sample and may need to be seen in person Okay to take Azo as needed Increase fluids Call with questions

## 2019-11-08 NOTE — Progress Notes (Signed)
Virtual Visit via Video Note  I connected with Jill Phillips on 11/08/19 at 10:45 AM EST by a video enabled telemedicine application and verified that I am speaking with the correct person using two identifiers.   I discussed the limitations of evaluation and management by telemedicine and the availability of in person appointments. The patient expressed understanding and agreed to proceed.  Present for the visit:  Myself, Dr Billey Gosling, Janne Lab.  The patient is currently at home and I am in the office.    No referring provider.    History of Present Illness: This is an acute visit for possible UTI   She has been having bladder pain.  It started a few days ago and was severe over the weekend.  She states significant pain over the weekend in her bladder.  She has had increased urinary frequency and urgency.  She has not had any dysuria, flank pain, hematuria or change in urine color.  She denies fever.  She started taking azo, which helped a little.  She started taking augmentin over the weekend that her daughter had leftover from a dental infection and this did not help.     Review of Systems  Constitutional: Negative for fever.  Gastrointestinal: Positive for abdominal pain (suprapubic pain).  Genitourinary: Positive for frequency and urgency. Negative for dysuria, flank pain and hematuria.       No urine odor or change in color      Social History   Socioeconomic History  . Marital status: Divorced    Spouse name: n/a  . Number of children: 2  . Years of education: 12+  . Highest education level: Not on file  Occupational History  . Occupation: disability    Comment: back surgeries  Tobacco Use  . Smoking status: Current Every Day Smoker    Packs/day: 1.25    Years: 45.00    Pack years: 56.25    Types: Cigarettes  . Smokeless tobacco: Never Used  . Tobacco comment: form given 12/25/16  Substance and Sexual Activity  . Alcohol use: No    Alcohol/week: 0.0  standard drinks  . Drug use: No    Types: Marijuana    Comment: 01/31/2016 "none since ~ 1980"  . Sexual activity: Never    Partners: Male  Other Topics Concern  . Not on file  Social History Narrative   Lives alone.  One daughter is local, but is getting ready to move to [place], where her children live with their father.  The other daughter lives near [place], [place].   Social Determinants of Health   Financial Resource Strain:   . Difficulty of Paying Living Expenses: Not on file  Food Insecurity:   . Worried About Charity fundraiser in the Last Year: Not on file  . Ran Out of Food in the Last Year: Not on file  Transportation Needs:   . Lack of Transportation (Medical): Not on file  . Lack of Transportation (Non-Medical): Not on file  Physical Activity:   . Days of Exercise per Week: Not on file  . Minutes of Exercise per Session: Not on file  Stress:   . Feeling of Stress : Not on file  Social Connections:   . Frequency of Communication with Friends and Family: Not on file  . Frequency of Social Gatherings with Friends and Family: Not on file  . Attends Religious Services: Not on file  . Active Member of Clubs or Organizations: Not on file  .  Attends Archivist Meetings: Not on file  . Marital Status: Not on file     Observations/Objective: Appears well in NAD Breathing normally Skin appears warm and dry  Assessment and Plan:  See Problem List for Assessment and Plan of chronic medical problems.   Follow Up Instructions:    I discussed the assessment and treatment plan with the patient. The patient was provided an opportunity to ask questions and all were answered. The patient agreed with the plan and demonstrated an understanding of the instructions.   The patient was advised to call back or seek an in-person evaluation if the symptoms worsen or if the condition fails to improve as anticipated.    [name], MD

## 2019-11-09 ENCOUNTER — Telehealth: Payer: Self-pay | Admitting: Orthopedic Surgery

## 2019-11-09 NOTE — Telephone Encounter (Signed)
Pt called in to reschedule a missed appt for 11-15-19. The pt states Dr. [name] used to give her injections in her thumbs and was wondering if Dr. Marlou Sa would give her those injections, pt stated she couldn't remember what type of injection it was.   [phone]

## 2019-11-09 NOTE — Telephone Encounter (Signed)
Check [place] registry last filled 10/13/2019.Marland KitchenJohny Chess

## 2019-11-09 NOTE — Telephone Encounter (Signed)
I left voicemail for patient advising Dr. Marlou Sa is able to do [facility] injection if he deems it is what she needs.

## 2019-11-10 ENCOUNTER — Ambulatory Visit: Payer: Medicare Other | Admitting: Orthopedic Surgery

## 2019-11-12 ENCOUNTER — Telehealth: Payer: Self-pay | Admitting: Internal Medicine

## 2019-11-12 NOTE — Telephone Encounter (Signed)
Pt advised to go to UC since she is in so much pain. Pt expressed understanding an states she will go today or tomorrow.

## 2019-11-12 NOTE — Telephone Encounter (Signed)
Pt was seen in 3/8 for this issue? Would another urine sample be needed? Please advise

## 2019-11-12 NOTE — Telephone Encounter (Signed)
Needs to give urine sample or go to urgent care

## 2019-11-12 NOTE — Telephone Encounter (Signed)
New message:   Pt is calling and states she is not getting any better from the last time she was seen with an bladder infection. She states she was prescribed some medication but it does not seem to be helping per the patient. Please advise.

## 2019-11-15 ENCOUNTER — Other Ambulatory Visit: Payer: Self-pay

## 2019-11-15 ENCOUNTER — Other Ambulatory Visit: Payer: Self-pay | Admitting: Internal Medicine

## 2019-11-15 ENCOUNTER — Ambulatory Visit (INDEPENDENT_AMBULATORY_CARE_PROVIDER_SITE_OTHER): Payer: Medicare Other

## 2019-11-15 ENCOUNTER — Encounter: Payer: Self-pay | Admitting: Orthopedic Surgery

## 2019-11-15 ENCOUNTER — Ambulatory Visit (INDEPENDENT_AMBULATORY_CARE_PROVIDER_SITE_OTHER): Payer: Medicare Other | Admitting: Orthopedic Surgery

## 2019-11-15 DIAGNOSIS — Z96612 Presence of left artificial shoulder joint: Secondary | ICD-10-CM

## 2019-11-15 NOTE — Progress Notes (Signed)
Subjective:    Patient ID: Jill Phillips, female    DOB: June 24, 1956, 64 y.o.   MRN: [id number]  HPI The patient is here for an acute visit.  We had a virtual visit 3/8 for acute cystitis.  She took Cipro at that time for a presumed infection.  Helped initially, but symptoms came back before finishing the antibiotic.  She states her current symptoms are bladder pain, which the AZO ? helps. She also has urinary frequency, urinary urgency.  She denies dysuria, hematuria, abdominal pain, back pain, nausea, fever.  She is taking AZO and can not tell if there is blood.     Medications and allergies reviewed with patient and updated if appropriate.  Patient Active Problem List   Diagnosis Date Noted  . Acute cystitis without hematuria 11/08/2019  . Status post shoulder surgery 09/09/2019  . Nausea 08/19/2019  . Anxiety 05/04/2019  . Insomnia 05/04/2019  . Headache 12/08/2018  . Syncope 12/08/2018  . COPD with exacerbation ([place]) 11/26/2018  . Disorder of rotator cuff syndrome of left shoulder and allied disorder 06/22/2018  . Leg edema, left 06/04/2018  . Type 2 diabetes mellitus without complication, without long-term current use of insulin ([place]) 12/02/2017  . Sacral pain 09/17/2017  . COPD (chronic obstructive pulmonary disease) ([place]) 04/21/2017  . Lower back pain 03/03/2017  . CKD (chronic kidney disease) stage 3, GFR 30-59 ml/min 08/07/2016  . GERD (gastroesophageal reflux disease) 08/07/2016  . Chronic respiratory failure ([place]) 07/29/2016  . Arthritis of carpometacarpal [facility]) joint of left thumb 07/09/2016  . Pneumothorax on left 01/31/2016  . Chronic, continuous use of opioids 09/06/2015  . Degenerative disc disease, lumbar 08/01/2015  . Spondylolisthesis of lumbar region 08/01/2015  . Urinary, incontinence, stress female 05/06/2014  . Constipation 05/05/2014  . 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 HTN (hypertension) 05/19/2013  . HLD (hyperlipidemia) 05/19/2013  . Depression  05/19/2013  . Hypothyroidism 05/19/2013  . Tobacco abuse   . Osteoarthritis of both knees 11/18/2011  . RLS (restless legs syndrome) 11/18/2011    Current Outpatient Medications on File Prior to Visit  Medication Sig Dispense Refill  . acetaminophen (TYLENOL) 500 MG tablet Take 1,000 mg by mouth every 6 (six) hours as needed (for headaches.).    [name] albuterol (PROAIR HFA) 108 (90 Base) MCG/ACT inhaler INHALE 1 PUFF INTO THE LUNGS EVERY 6 HOURS AS NEEDED FOR WHEEZING OR SHORTNESS OF BREATH.NEED OFFICE VISIT FOR FURTHER REFILLS (Patient taking differently: Inhale 1 puff into the lungs every 6 (six) hours as needed for wheezing or shortness of breath. ) 8.5 g 0  . amLODipine-benazepril (LOTREL) 10-40 MG capsule Take 1 capsule by mouth daily. 90 capsule 1  . aspirin EC 81 MG tablet Take 1 tablet (81 mg total) by mouth 2 (two) times daily. 60 tablet 0  . atorvastatin (LIPITOR) 20 MG tablet TAKE 1 TABLET BY MOUTH DAILY. 90 tablet 0  . benzoyl peroxide (BENZOYL PEROXIDE) 5 % external liquid Wash affected shoulder daily with wash for 3 days prior to surgery 142 g 0  . cimetidine (TAGAMET) 400 MG tablet Take 400 mg by mouth 2 (two) times daily.    . clonazePAM (KLONOPIN) 1 MG tablet TAKE 1 TABLET BY MOUTH 2 TIMES DAILY AS NEEDED FOR ANXIETY 60 tablet 0  . docusate sodium (COLACE) 100 MG capsule Take 200 mg by mouth at bedtime.    . fentaNYL (DURAGESIC) 50 MCG/HR PLACE 1 PATCH ONTO THE SKIN EVERY 3 DAYS. 10 patch 0  .  gabapentin (NEURONTIN) 400 MG capsule TAKE 1 CAPSULE BY MOUTH 3 TIMES DAILY. 270 capsule 0  . HYDROcodone-acetaminophen (NORCO) 10-325 MG tablet Take 1-2 tablets by mouth every 6 (six) hours as needed for severe pain. 40 tablet 0  . levothyroxine (SYNTHROID) 88 MCG tablet TAKE 1 TABLET BY MOUTH DAILY. 90 tablet 1  . NARCAN 4 MG/0.1ML LIQD nasal spray kit Place 1 spray into the nose as needed (accidental overdose).     [name] omeprazole (PRILOSEC) 40 MG capsule TAKE 1 CAPSULE BY MOUTH 2 TIMES  DAILY BEFORE A MEAL. (Patient taking differently: Take 40 mg by mouth 2 (two) times daily. ) 180 capsule 3  . QUEtiapine (SEROQUEL) 100 MG tablet TAKE 1 TABLET BY MOUTH AT BEDTIME. 30 tablet 2  . Sennosides (SENNA LAX PO) Take 2 tablets by mouth at bedtime.     [name] tiotropium (SPIRIVA) 18 MCG inhalation capsule Place 18 mcg into inhaler and inhale daily.    . valACYclovir (VALTREX) 500 MG tablet TAKE 1 TABLET BY MOUTH DAILY. 60 tablet 0  . venlafaxine XR (EFFEXOR-XR) 150 MG 24 hr capsule TAKE 2 CAPSULES BY MOUTH DAILY. (Patient taking differently: Take 300 mg by mouth daily. ) 180 capsule 0   Current Facility-Administered Medications on File Prior to Visit  Medication Dose Route Frequency Provider Last Rate Last Admin  . calcitonin (salmon) (MIACALCIN/FORTICAL) nasal spray 1 spray  1 spray Alternating Nares Daily [name], MD        Past Medical History:  Diagnosis Date  . Active smoker   . Anxiety   . Calcifying tendinitis of shoulder   . Chronic back pain   . Chronic pain syndrome   . COPD (chronic obstructive pulmonary disease) ([place])   . Dysthymic disorder   . Emphysema lung ([place])   . GERD (gastroesophageal reflux disease)   . Headache    "weekly maybe" (01/31/2016)  . Heart murmur    for years, nothing to be concerned about  . Herpes genitalia   . History of blood transfusion 1980   related to "back surgery"  . Hyperlipidemia   . Hypertension   . Hypothyroidism   . Lumbago   . Osteoarthrosis, unspecified whether generalized or localized, lower leg   . Pain in joint, upper arm   . Pneumothorax, left 01/31/2016   S/P Left posterior subcostal pain injection on 01/30/2016  . PONV (postoperative nausea and vomiting)    gets nauseous  with longer surgery. Difficuty voiding after surgery  . Postlaminectomy syndrome, thoracic region   . Primary localized osteoarthrosis, lower leg   . Restless leg syndrome   . Sleep apnea    s/p surgery- last sleep study 2011- doesnt use  oxygen or machine at night as instructed,.   12/2014- Dr Halford Chessman  reports it is negative.  . Thyroid disease     Past Surgical History:  Procedure Laterality Date  . APPENDECTOMY    . BACK SURGERY     18 back surgeries (2 thoracic & 16 lumbar) (01/31/2016)  . DILATION AND CURETTAGE OF UTERUS    . HAMMER TOE SURGERY    . IR RADIOLOGIST EVAL & MGMT  07/21/2017  . JOINT REPLACEMENT    . KNEE ARTHROSCOPY Right   . LAPAROSCOPIC CHOLECYSTECTOMY    . LUMBAR FUSION N/A 08/01/2015   Procedure: Right sided L1-2 and L2-3 transforaminal lumbar interbody fusion with cages, Extension of posterior fusion T12 to L3, Replaced pedicle screws bilaterally L1-L2 , Replaced left sided pedicle screws L-3.  Instrumentation T12 to L3 using local bone graft, Vivigen allograft and cancellous chips;  Surgeon: [name], MD;  Location: [facility];  Service: Orthopedics;  Laterality: N/A;  . LUMBAR LAMINECTOMY/DECOMPRESSION MICRODISCECTOMY N/A 01/28/2014   Procedure: Minimally Invasive Right  L1-2 Microdiscectomy;  Surgeon: [name], MD;  Location: [facility];  Service: Orthopedics;  Laterality: N/A;  . REVERSE SHOULDER ARTHROPLASTY Left 09/09/2019   Procedure: LEFT REVERSE SHOULDER REPLACEMENT;  Surgeon: Meredith Pel, MD;  Location: [facility];  Service: Orthopedics;  Laterality: Left;  . TOTAL HIP ARTHROPLASTY Right   . TOTAL KNEE ARTHROPLASTY  05/29/2012   Procedure: TOTAL KNEE ARTHROPLASTY;  Surgeon: Mcarthur Rossetti, MD;  Location: [facility];  Service: Orthopedics;  Laterality: Right;  Right Total Knee Arthroplasty  . TUBAL LIGATION    . UVULOPALATOPHARYNGOPLASTY    . VAGINAL HYSTERECTOMY      Social History   Socioeconomic History  . Marital status: Divorced    Spouse name: n/a  . Number of children: 2  . Years of education: 12+  . Highest education level: Not on file  Occupational History  . Occupation: disability    Comment: back surgeries  Tobacco Use  . Smoking status: Current Every Day Smoker     Packs/day: 1.25    Years: 45.00    Pack years: 56.25    Types: Cigarettes  . Smokeless tobacco: Never Used  . Tobacco comment: form given 12/25/16  Substance and Sexual Activity  . Alcohol use: No    Alcohol/week: 0.0 standard drinks  . Drug use: No    Types: Marijuana    Comment: 01/31/2016 "none since ~ 1980"  . Sexual activity: Never    Partners: Male  Other Topics Concern  . Not on file  Social History Narrative   Lives alone.  One daughter is local, but is getting ready to move to [place], where her children live with their father.  The other daughter lives near [place], [place].   Social Determinants of Health   Financial Resource Strain:   . Difficulty of Paying Living Expenses:   Food Insecurity:   . Worried About Charity fundraiser in the Last Year:   . Arboriculturist in the Last Year:   Transportation Needs:   . Film/video editor (Medical):   [name] Lack of Transportation (Non-Medical):   Physical Activity:   . Days of Exercise per Week:   . Minutes of Exercise per Session:   Stress:   . Feeling of Stress :   Social Connections:   . Frequency of Communication with Friends and Family:   . Frequency of Social Gatherings with Friends and Family:   . Attends Religious Services:   . Active Member of Clubs or Organizations:   . Attends Archivist Meetings:   [name] Marital Status:     Family History  Problem Relation Age of Onset  . Kidney disease Mother   . Heart disease Father   . Anuerysm Brother 29       brain  . Heart disease Brother   . Heart disease Sister 51       s/p CABG  . Hypertension Sister   . Colon cancer Neg Hx     Review of Systems   Per HPI     Objective:   Vitals:   11/16/19 1449  BP: 114/70  Pulse: 96  Resp: 16  Temp: 98.1 F (36.7 C)  SpO2: 94%   BP Readings from  Last 3 Encounters:  11/16/19 114/70  09/10/19 118/88  09/07/19 139/73   Wt Readings from Last 3 Encounters:  11/16/19 147 lb (66.7 kg)  09/22/19 151  lb (68.5 kg)  09/09/19 151 lb 3 oz (68.6 kg)   Body mass index is 25.23 kg/m.   Physical Exam Constitutional:      General: She is not in acute distress.    Appearance: Normal appearance. She is not ill-appearing.  HENT:     Head: Normocephalic and atraumatic.  Abdominal:     General: There is no distension.     Palpations: Abdomen is soft.     Tenderness: There is abdominal tenderness (Mild suprapubic tenderness with palpation). There is no right CVA tenderness, left CVA tenderness, guarding or rebound.  Skin:    General: Skin is warm and dry.  Neurological:     Mental Status: She is alert.            Assessment & Plan:    See Problem List for Assessment and Plan of chronic medical problems.    This visit occurred during the SARS-CoV-2 public health emergency.  Safety protocols were in place, including screening questions prior to the visit, additional usage of staff PPE, and extensive cleaning of exam room while observing appropriate contact time as indicated for disinfecting solutions.

## 2019-11-15 NOTE — Progress Notes (Signed)
Post-Op Visit Note   Patient: Jill Phillips           Date of Birth: Jun 24, 1956           MRN: [id number] Visit Date: 11/15/2019 PCP: Jill Rail, MD   Assessment & Plan:  Chief Complaint:  Chief Complaint  Patient presents with  . Left Shoulder - Pain   Visit Diagnoses:  1. S/P reverse total shoulder arthroplasty, left     Plan: Jill Phillips is a 64 year old patient who had a fall following reverse shoulder replacement 2 months ago.  She is in pain management.  She states her range of motion is good and that she is doing most things with the left shoulder.  She is doing well in therapy.  She also reports bilateral thumb pain and has a known history of bilateral CMC arthritis.  On exam she does have positive grind test in both thumbs worse on the left than the right.  Shoulder range of motion is actually good with forward flexion abduction both about 90 degrees.  No real tenderness to palpation around the incision.  No other masses lymphadenopathy or skin changes noted in the shoulder region impression is normal x-rays and good range of motion with no complicating features continue with doing her own therapy come back in 8 weeks.  We will probably inject that left [facility] joint at that time.  Follow-Up Instructions: No follow-ups on file.   Orders:  Orders Placed This Encounter  Procedures  . XR Shoulder Left   No orders of the defined types were placed in this encounter.   Imaging: XR Shoulder Left  Result Date: 11/15/2019 AP outlet axillary left shoulder reviewed.  Reverse shoulder replacement in good position alignment with no complicating features.  No fracture.   PMFS History: Patient Active Problem List   Diagnosis Date Noted  . Acute cystitis without hematuria 11/08/2019  . Status post shoulder surgery 09/09/2019  . Nausea 08/19/2019  . Anxiety 05/04/2019  . Insomnia 05/04/2019  . Headache 12/08/2018  . Syncope 12/08/2018  . COPD with exacerbation ([place]) 11/26/2018  .  Disorder of rotator cuff syndrome of left shoulder and allied disorder 06/22/2018  . Leg edema, left 06/04/2018  . Type 2 diabetes mellitus without complication, without long-term current use of insulin ([place]) 12/02/2017  . Sacral pain 09/17/2017  . COPD (chronic obstructive pulmonary disease) ([place]) 04/21/2017  . Lower back pain 03/03/2017  . CKD (chronic kidney disease) stage 3, GFR 30-59 ml/min 08/07/2016  . GERD (gastroesophageal reflux disease) 08/07/2016  . Chronic respiratory failure ([place]) 07/29/2016  . Arthritis of carpometacarpal [facility]) joint of left thumb 07/09/2016  . Pneumothorax on left 01/31/2016  . Chronic, continuous use of opioids 09/06/2015  . Degenerative disc disease, lumbar 08/01/2015    Class: Chronic  . Spondylolisthesis of lumbar region 08/01/2015    Class: Chronic  . Urinary, incontinence, stress female 05/06/2014  . Constipation 05/05/2014  . 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 HTN (hypertension) 05/19/2013  . HLD (hyperlipidemia) 05/19/2013  . Depression 05/19/2013  . Hypothyroidism 05/19/2013  . Tobacco abuse   . Osteoarthritis of both knees 11/18/2011  . RLS (restless legs syndrome) 11/18/2011   Past Medical History:  Diagnosis Date  . Active smoker   . Anxiety   . Calcifying tendinitis of shoulder   . Chronic back pain   . Chronic pain syndrome   . COPD (chronic obstructive pulmonary disease) ([place])   . Dysthymic disorder   . Emphysema lung ([place])   .  GERD (gastroesophageal reflux disease)   . Headache    "weekly maybe" (01/31/2016)  . Heart murmur    for years, nothing to be concerned about  . Herpes genitalia   . History of blood transfusion 1980   related to "back surgery"  . Hyperlipidemia   . Hypertension   . Hypothyroidism   . Lumbago   . Osteoarthrosis, unspecified whether generalized or localized, lower leg   . Pain in joint, upper arm   . Pneumothorax, left 01/31/2016   S/P Left posterior subcostal pain injection on 01/30/2016  . PONV  (postoperative nausea and vomiting)    gets nauseous  with longer surgery. Difficuty voiding after surgery  . Postlaminectomy syndrome, thoracic region   . Primary localized osteoarthrosis, lower leg   . Restless leg syndrome   . Sleep apnea    s/p surgery- last sleep study 2011- doesnt use oxygen or machine at night as instructed,.   12/2014- Jill Phillips  reports it is negative.  . Thyroid disease     Family History  Problem Relation Age of Onset  . Kidney disease Mother   . Heart disease Father   . Jill Phillips Brother 29       brain  . Heart disease Brother   . Heart disease Sister 30       s/p CABG  . Hypertension Sister   . Colon cancer Neg Hx     Past Surgical History:  Procedure Laterality Date  . APPENDECTOMY    . BACK SURGERY     18 back surgeries (2 thoracic & 16 lumbar) (01/31/2016)  . DILATION AND CURETTAGE OF UTERUS    . HAMMER TOE SURGERY    . IR RADIOLOGIST EVAL & MGMT  07/21/2017  . JOINT REPLACEMENT    . KNEE ARTHROSCOPY Right   . LAPAROSCOPIC CHOLECYSTECTOMY    . LUMBAR FUSION N/A 08/01/2015   Procedure: Right sided L1-2 and L2-3 transforaminal lumbar interbody fusion with cages, Extension of posterior fusion T12 to L3, Replaced pedicle screws bilaterally L1-L2 , Replaced left sided pedicle screws L-3. Instrumentation T12 to L3 using local bone graft, Vivigen allograft and cancellous chips;  Surgeon: Jill Oto, MD;  Location: [facility];  Service: Orthopedics;  Laterality: N/A;  . LUMBAR LAMINECTOMY/DECOMPRESSION MICRODISCECTOMY N/A 01/28/2014   Procedure: Minimally Invasive Right  L1-2 Microdiscectomy;  Surgeon: Jill Oto, MD;  Location: [facility];  Service: Orthopedics;  Laterality: N/A;  . REVERSE SHOULDER ARTHROPLASTY Left 09/09/2019   Procedure: LEFT REVERSE SHOULDER REPLACEMENT;  Surgeon: Jill Pel, MD;  Location: [name];  Service: Orthopedics;  Laterality: Left;  . TOTAL HIP ARTHROPLASTY Right   . TOTAL KNEE ARTHROPLASTY  05/29/2012   Procedure: TOTAL KNEE  ARTHROPLASTY;  Surgeon: Jill Rossetti, MD;  [facility];  Service: Orthopedics;  Laterality: Right;  Right Total Knee Arthroplasty  . TUBAL LIGATION    . UVULOPALATOPHARYNGOPLASTY    . VAGINAL HYSTERECTOMY     Social History   Occupational History  . Occupation: disability    Comment: back surgeries  Tobacco Use  . Smoking status: Current Every Day Smoker    Packs/day: 1.25    Years: 45.00    Pack years: 56.25    Types: Cigarettes  . Smokeless tobacco: Never Used  . Tobacco comment: form given 12/25/16  Substance and Sexual Activity  . Alcohol use: No    Alcohol/week: 0.0 standard drinks  . Drug use: No    Types: Marijuana    Comment:  01/31/2016 "none since ~ 1980"  . Sexual activity: Never    Partners: Male

## 2019-11-16 ENCOUNTER — Ambulatory Visit (INDEPENDENT_AMBULATORY_CARE_PROVIDER_SITE_OTHER): Payer: Medicare Other | Admitting: Internal Medicine

## 2019-11-16 ENCOUNTER — Encounter: Payer: Self-pay | Admitting: Internal Medicine

## 2019-11-16 ENCOUNTER — Telehealth: Payer: Self-pay | Admitting: Specialist

## 2019-11-16 ENCOUNTER — Other Ambulatory Visit: Payer: Self-pay

## 2019-11-16 VITALS — BP 114/70 | HR 96 | Temp 98.1°F | Resp 16 | Ht 64.0 in | Wt 147.0 lb

## 2019-11-16 DIAGNOSIS — N3000 Acute cystitis without hematuria: Secondary | ICD-10-CM | POA: Diagnosis not present

## 2019-11-16 DIAGNOSIS — R300 Dysuria: Secondary | ICD-10-CM | POA: Diagnosis not present

## 2019-11-16 LAB — POCT URINALYSIS DIPSTICK
Blood, UA: NEGATIVE
Glucose, UA: POSITIVE — AB
Nitrite, UA: POSITIVE
Protein, UA: POSITIVE — AB
Spec Grav, UA: 1.025 (ref 1.010–1.025)
Urobilinogen, UA: 2 E.U./dL — AB
pH, UA: 5 (ref 5.0–8.0)

## 2019-11-16 MED ORDER — FLUCONAZOLE 150 MG PO TABS: 150.0000 mg | ORAL_TABLET | Freq: Once | ORAL | 0 refills | Status: AC

## 2019-11-16 MED ORDER — AMOXICILLIN-POT CLAVULANATE 875-125 MG PO TABS: 1.0000 | ORAL_TABLET | Freq: Two times a day (BID) | ORAL | 0 refills | Status: DC

## 2019-11-16 NOTE — Assessment & Plan Note (Signed)
Acute Diagnosed with acute cystitis.  This month with a virtual visit and took Cipro-this helped initially, but symptoms returned before completing the antibiotic Has cystitis-definite pain in bladder-?  Bacterial versus interstitial cystitis Urine dip is suggestive of an infection so we will go ahead and treat with Augmentin twice daily, Diflucan as needed since this causes yeast infections We will send for culture Increase fluids Call if no improvement

## 2019-11-16 NOTE — Telephone Encounter (Signed)
Patient called advised she is having anal pain and would like to make an appointment to see Dr [name]. The number to contact patient is [phone]

## 2019-11-16 NOTE — Patient Instructions (Signed)
Take the antibiotic as prescribed.  Take tylenol if needed.  Use the diflucan if needed for a yeast infection.     Increase your water intake.   Call if no improvement     Urinary Tract Infection, Adult A urinary tract infection (UTI) is an infection of any part of the urinary tract, which includes the kidneys, ureters, bladder, and urethra. These organs make, store, and get rid of urine in the body. UTI can be a bladder infection (cystitis) or kidney infection (pyelonephritis). What are the causes? This infection may be caused by fungi, viruses, or bacteria. Bacteria are the most common cause of UTIs. This condition can also be caused by repeated incomplete emptying of the bladder during urination. What increases the risk? This condition is more likely to develop if:  You ignore your need to urinate or hold urine for long periods of time.  You do not empty your bladder completely during urination.  You wipe back to front after urinating or having a bowel movement, if you are female.  You are uncircumcised, if you are female.  You are constipated.  You have a urinary catheter that stays in place (indwelling).  You have a weak defense (immune) system.  You have a medical condition that affects your bowels, kidneys, or bladder.  You have diabetes.  You take antibiotic medicines frequently or for long periods of time, and the antibiotics no longer work well against certain types of infections (antibiotic resistance).  You take medicines that irritate your urinary tract.  You are exposed to chemicals that irritate your urinary tract.  You are female.  What are the signs or symptoms? Symptoms of this condition include:  Fever.  Frequent urination or passing small amounts of urine frequently.  Needing to urinate urgently.  Pain or burning with urination.  Urine that smells bad or unusual.  Cloudy urine.  Pain in the lower abdomen or back.  Trouble urinating.  Blood  in the urine.  Vomiting or being less hungry than normal.  Diarrhea or abdominal pain.  Vaginal discharge, if you are female.  How is this diagnosed? This condition is diagnosed with a medical history and physical exam. You will also need to provide a urine sample to test your urine. Other tests may be done, including:  Blood tests.  Sexually transmitted disease (STD) testing.  If you have had more than one UTI, a cystoscopy or imaging studies may be done to determine the cause of the infections. How is this treated? Treatment for this condition often includes a combination of two or more of the following:  Antibiotic medicine.  Other medicines to treat less common causes of UTI.  Over-the-counter medicines to treat pain.  Drinking enough water to stay hydrated.  Follow these instructions at home:  Take over-the-counter and prescription medicines only as told by your health care provider.  If you were prescribed an antibiotic, take it as told by your health care provider. Do not stop taking the antibiotic even if you start to feel better.  Avoid alcohol, caffeine, tea, and carbonated beverages. They can irritate your bladder.  Drink enough fluid to keep your urine clear or pale yellow.  Keep all follow-up visits as told by your health care provider. This is important.  Make sure to: ? Empty your bladder often and completely. Do not hold urine for long periods of time. ? Empty your bladder before and after sex. ? Wipe from front to back after a bowel movement if  you are female. Use each tissue one time when you wipe. Contact a health care provider if:  You have back pain.  You have a fever.  You feel nauseous or vomit.  Your symptoms do not get better after 3 days.  Your symptoms go away and then return. Get help right away if:  You have severe back pain or lower abdominal pain.  You are vomiting and cannot keep down any medicines or water. This information  is not intended to replace advice given to you by your health care provider. Make sure you discuss any questions you have with your health care provider. Document Released: 05/29/2005 Document Revised: 01/31/2016 Document Reviewed: 07/10/2015 Elsevier Interactive Patient Education  [employer].

## 2019-11-17 ENCOUNTER — Other Ambulatory Visit: Payer: Self-pay | Admitting: Internal Medicine

## 2019-11-18 ENCOUNTER — Encounter: Payer: Self-pay | Admitting: Internal Medicine

## 2019-11-18 LAB — URINE CULTURE

## 2019-11-19 ENCOUNTER — Telehealth: Payer: Self-pay | Admitting: Internal Medicine

## 2019-11-19 DIAGNOSIS — Z79891 Long term (current) use of opiate analgesic: Secondary | ICD-10-CM | POA: Diagnosis not present

## 2019-11-19 DIAGNOSIS — G894 Chronic pain syndrome: Secondary | ICD-10-CM | POA: Diagnosis not present

## 2019-11-19 DIAGNOSIS — M25511 Pain in right shoulder: Secondary | ICD-10-CM | POA: Diagnosis not present

## 2019-11-19 DIAGNOSIS — G8929 Other chronic pain: Secondary | ICD-10-CM | POA: Diagnosis not present

## 2019-11-19 DIAGNOSIS — R3989 Other symptoms and signs involving the genitourinary system: Secondary | ICD-10-CM

## 2019-11-19 DIAGNOSIS — M545 Low back pain: Secondary | ICD-10-CM | POA: Diagnosis not present

## 2019-11-19 NOTE — Telephone Encounter (Signed)
New message:   Pt is calling and states she was given an antibiotic for a possible UTI. She states she is not burning anymore when she urinates but she still feels a burning sensation in her vagina.

## 2019-11-19 NOTE — Telephone Encounter (Signed)
She should complete the entire course of the antibiotic and if she is still having those symptoms she should let us know-if that is still going on at that time she may need to see a gynecologist

## 2019-11-19 NOTE — Telephone Encounter (Signed)
I called and lmom that she needs to see her PCP as nothing that we have ever done has helped her. This is per Dr. [name]

## 2019-11-22 NOTE — Telephone Encounter (Signed)
2 pills daily for 5 days then resume 1 pill daily

## 2019-11-22 NOTE — Telephone Encounter (Signed)
Pt states she is having bad burning in one area inside of her vagina. I advised for her to continue with antibiotic. She states she felt this before 11 years ago and it ended up being herpes. She take her valtrex daily but she increased it to 3 tablets yesterday. She wants to know if she can increase the valtrex for a couple of days to see if that helps. If so how much should she increase it to?

## 2019-11-22 NOTE — Telephone Encounter (Signed)
Let pt know response below. She stated that she was hurting so bad in her vaginal area that she was in tears. I advised that she follow up with GYN. She is going to call then to see how soon she can be seen.

## 2019-11-23 ENCOUNTER — Telehealth: Payer: Self-pay | Admitting: Internal Medicine

## 2019-11-23 NOTE — Telephone Encounter (Signed)
She thinks it is interstitial cystitis. Would like be more urology or gyn

## 2019-11-23 NOTE — Addendum Note (Signed)
Addended by: [name] on: 11/23/2019 08:21 PM   Modules accepted: Orders

## 2019-11-23 NOTE — Telephone Encounter (Signed)
urology

## 2019-11-23 NOTE — Telephone Encounter (Signed)
Documented in another note.

## 2019-11-23 NOTE — Telephone Encounter (Signed)
  Follow up call  Patient wants to know if she should see a Engineer, manufacturing Declined appointment with Dr Quay Burow

## 2019-11-23 NOTE — Telephone Encounter (Signed)
If the pain is in the vagina - gyn

## 2019-11-23 NOTE — Telephone Encounter (Signed)
Can you put in referral please

## 2019-11-23 NOTE — Telephone Encounter (Signed)
New message:    Pt is calling and states the OB doctor is needing some paperwork faxed over. She states she is in so much pain and can't take it anymore. Please advise.

## 2019-11-23 NOTE — Telephone Encounter (Signed)
ordered

## 2019-12-06 ENCOUNTER — Other Ambulatory Visit: Payer: Self-pay | Admitting: Internal Medicine

## 2019-12-14 ENCOUNTER — Other Ambulatory Visit: Payer: Self-pay | Admitting: Internal Medicine

## 2019-12-15 ENCOUNTER — Other Ambulatory Visit: Payer: Self-pay | Admitting: Internal Medicine

## 2019-12-15 DIAGNOSIS — Z1231 Encounter for screening mammogram for malignant neoplasm of breast: Secondary | ICD-10-CM

## 2019-12-16 ENCOUNTER — Other Ambulatory Visit: Payer: Self-pay | Admitting: Internal Medicine

## 2019-12-19 NOTE — Patient Instructions (Signed)
  Blood work was ordered.     Medications reviewed and updated.  Changes include :     Your prescription(s) have been submitted to your pharmacy. Please take as directed and contact our office if you believe you are having problem(s) with the medication(s).  A referral was ordered for        Someone will call you to schedule this.

## 2019-12-19 NOTE — Progress Notes (Signed)
Subjective:    Patient ID: Jill Phillips, female    DOB: Dec 04, 1955, 64 y.o.   MRN: [id number]  HPI The patient is here for an acute visit.      Medications and allergies reviewed with patient and updated if appropriate.  Patient Active Problem List   Diagnosis Date Noted  . Acute cystitis without hematuria 11/08/2019  . Status post shoulder surgery 09/09/2019  . Nausea 08/19/2019  . Anxiety 05/04/2019  . Insomnia 05/04/2019  . Headache 12/08/2018  . Syncope 12/08/2018  . COPD with exacerbation ([place]) 11/26/2018  . Disorder of rotator cuff syndrome of left shoulder and allied disorder 06/22/2018  . Leg edema, left 06/04/2018  . Type 2 diabetes mellitus without complication, without long-term current use of insulin ([place]) 12/02/2017  . Sacral pain 09/17/2017  . COPD (chronic obstructive pulmonary disease) ([place]) 04/21/2017  . Lower back pain 03/03/2017  . CKD (chronic kidney disease) stage 3, GFR 30-59 ml/min 08/07/2016  . GERD (gastroesophageal reflux disease) 08/07/2016  . Chronic respiratory failure ([place]) 07/29/2016  . Arthritis of carpometacarpal [facility]) joint of left thumb 07/09/2016  . Pneumothorax on left 01/31/2016  . Chronic, continuous use of opioids 09/06/2015  . Degenerative disc disease, lumbar 08/01/2015  . Spondylolisthesis of lumbar region 08/01/2015  . Urinary, incontinence, stress female 05/06/2014  . Constipation 05/05/2014  . 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 HTN (hypertension) 05/19/2013  . HLD (hyperlipidemia) 05/19/2013  . Depression 05/19/2013  . Hypothyroidism 05/19/2013  . Tobacco abuse   . Osteoarthritis of both knees 11/18/2011  . RLS (restless legs syndrome) 11/18/2011    Current Outpatient Medications on File Prior to Visit  Medication Sig Dispense Refill  . atorvastatin (LIPITOR) 20 MG tablet TAKE 1 TABLET BY MOUTH DAILY. 90 tablet 1  . acetaminophen (TYLENOL) 500 MG tablet Take 1,000 mg by mouth every 6 (six) hours as needed (for  headaches.).    [name] albuterol (PROAIR HFA) 108 (90 Base) MCG/ACT inhaler INHALE 1 PUFF INTO THE LUNGS EVERY 6 HOURS AS NEEDED FOR WHEEZING OR SHORTNESS OF BREATH.NEED OFFICE VISIT FOR FURTHER REFILLS (Patient taking differently: Inhale 1 puff into the lungs every 6 (six) hours as needed for wheezing or shortness of breath. ) 8.5 g 0  . amLODipine-benazepril (LOTREL) 10-40 MG capsule Take 1 capsule by mouth daily. 90 capsule 1  . amoxicillin-clavulanate (AUGMENTIN) 875-125 MG tablet Take 1 tablet by mouth 2 (two) times daily. 20 tablet 0  . aspirin EC 81 MG tablet Take 1 tablet (81 mg total) by mouth 2 (two) times daily. 60 tablet 0  . benzoyl peroxide (BENZOYL PEROXIDE) 5 % external liquid Wash affected shoulder daily with wash for 3 days prior to surgery 142 g 0  . cimetidine (TAGAMET) 400 MG tablet Take 400 mg by mouth 2 (two) times daily.    . clonazePAM (KLONOPIN) 1 MG tablet TAKE 1 TABLET BY MOUTH 2 TIMES DAILY AS NEEDED FOR ANXIETY 60 tablet 0  . docusate sodium (COLACE) 100 MG capsule Take 200 mg by mouth at bedtime.    . fentaNYL (DURAGESIC) 50 MCG/HR PLACE 1 PATCH ONTO THE SKIN EVERY 3 DAYS. 10 patch 0  . gabapentin (NEURONTIN) 400 MG capsule TAKE 1 CAPSULE BY MOUTH 3 TIMES DAILY. 270 capsule 1  . HYDROcodone-acetaminophen (NORCO) 10-325 MG tablet Take 1-2 tablets by mouth every 6 (six) hours as needed for severe pain. 40 tablet 0  . levothyroxine (SYNTHROID) 88 MCG tablet TAKE 1 TABLET BY MOUTH DAILY. 90 tablet 1  .  NARCAN 4 MG/0.1ML LIQD nasal spray kit Place 1 spray into the nose as needed (accidental overdose).     [name] omeprazole (PRILOSEC) 40 MG capsule TAKE 1 CAPSULE BY MOUTH 2 TIMES DAILY BEFORE A MEAL. (Patient taking differently: Take 40 mg by mouth 2 (two) times daily. ) 180 capsule 3  . QUEtiapine (SEROQUEL) 100 MG tablet TAKE 1 TABLET BY MOUTH AT BEDTIME. 30 tablet 2  . Sennosides (SENNA LAX PO) Take 2 tablets by mouth at bedtime.     [name] tiotropium (SPIRIVA) 18 MCG inhalation capsule  Place 18 mcg into inhaler and inhale daily.    . valACYclovir (VALTREX) 500 MG tablet TAKE 1 TABLET BY MOUTH DAILY. 90 tablet 0  . venlafaxine XR (EFFEXOR-XR) 150 MG 24 hr capsule TAKE 2 CAPSULES BY MOUTH DAILY. (Patient taking differently: Take 300 mg by mouth daily. ) 180 capsule 0   Current Facility-Administered Medications on File Prior to Visit  Medication Dose Route Frequency Provider Last Rate Last Admin  . calcitonin (salmon) (MIACALCIN/FORTICAL) nasal spray 1 spray  1 spray Alternating Nares Daily [name], MD        Past Medical History:  Diagnosis Date  . Active smoker   . Anxiety   . Calcifying tendinitis of shoulder   . Chronic back pain   . Chronic pain syndrome   . COPD (chronic obstructive pulmonary disease) ([place])   . Dysthymic disorder   . Emphysema lung ([place])   . GERD (gastroesophageal reflux disease)   . Headache    "weekly maybe" (01/31/2016)  . Heart murmur    for years, nothing to be concerned about  . Herpes genitalia   . History of blood transfusion 1980   related to "back surgery"  . Hyperlipidemia   . Hypertension   . Hypothyroidism   . Lumbago   . Osteoarthrosis, unspecified whether generalized or localized, lower leg   . Pain in joint, upper arm   . Pneumothorax, left 01/31/2016   S/P Left posterior subcostal pain injection on 01/30/2016  . PONV (postoperative nausea and vomiting)    gets nauseous  with longer surgery. Difficuty voiding after surgery  . Postlaminectomy syndrome, thoracic region   . Primary localized osteoarthrosis, lower leg   . Restless leg syndrome   . Sleep apnea    s/p surgery- last sleep study 2011- doesnt use oxygen or machine at night as instructed,.   12/2014- Dr Halford Chessman  reports it is negative.  . Thyroid disease     Past Surgical History:  Procedure Laterality Date  . APPENDECTOMY    . BACK SURGERY     18 back surgeries (2 thoracic & 16 lumbar) (01/31/2016)  . DILATION AND CURETTAGE OF UTERUS    . HAMMER TOE  SURGERY    . IR RADIOLOGIST EVAL & MGMT  07/21/2017  . JOINT REPLACEMENT    . KNEE ARTHROSCOPY Right   . LAPAROSCOPIC CHOLECYSTECTOMY    . LUMBAR FUSION N/A 08/01/2015   Procedure: Right sided L1-2 and L2-3 transforaminal lumbar interbody fusion with cages, Extension of posterior fusion T12 to L3, Replaced pedicle screws bilaterally L1-L2 , Replaced left sided pedicle screws L-3. Instrumentation T12 to L3 using local bone graft, Vivigen allograft and cancellous chips;  Surgeon: [name], MD;  Location: [facility];  Service: Orthopedics;  Laterality: N/A;  . LUMBAR LAMINECTOMY/DECOMPRESSION MICRODISCECTOMY N/A 01/28/2014   Procedure: Minimally Invasive Right  L1-2 Microdiscectomy;  Surgeon: [name], MD;  Location: [facility];  Service: Orthopedics;  Laterality: N/A;  . REVERSE SHOULDER ARTHROPLASTY Left 09/09/2019   Procedure: LEFT REVERSE SHOULDER REPLACEMENT;  Surgeon: Meredith Pel, MD;  Location: [place];  Service: Orthopedics;  Laterality: Left;  . TOTAL HIP ARTHROPLASTY Right   . TOTAL KNEE ARTHROPLASTY  05/29/2012   Procedure: TOTAL KNEE ARTHROPLASTY;  Surgeon: Mcarthur Rossetti, MD;  Location: [facility];  Service: Orthopedics;  Laterality: Right;  Right Total Knee Arthroplasty  . TUBAL LIGATION    . UVULOPALATOPHARYNGOPLASTY    . VAGINAL HYSTERECTOMY      Social History   Socioeconomic History  . Marital status: Divorced    Spouse name: n/a  . Number of children: 2  . Years of education: 12+  . Highest education level: Not on file  Occupational History  . Occupation: disability    Comment: back surgeries  Tobacco Use  . Smoking status: Current Every Day Smoker    Packs/day: 1.25    Years: 45.00    Pack years: 56.25    Types: Cigarettes  . Smokeless tobacco: Never Used  . Tobacco comment: form given 12/25/16  Substance and Sexual Activity  . Alcohol use: No    Alcohol/week: 0.0 standard drinks  . Drug use: No    Types: Marijuana    Comment: 01/31/2016 "none since ~  1980"  . Sexual activity: Never    Partners: Male  Other Topics Concern  . Not on file  Social History Narrative   Lives alone.  One daughter is local, but is getting ready to move to [place], where her children live with their father.  The other daughter lives near [place], [place].   Social Determinants of Health   Financial Resource Strain:   . Difficulty of Paying Living Expenses:   Food Insecurity:   . Worried About Charity fundraiser in the Last Year:   . Arboriculturist in the Last Year:   Transportation Needs:   . Film/video editor (Medical):   [name] Lack of Transportation (Non-Medical):   Physical Activity:   . Days of Exercise per Week:   . Minutes of Exercise per Session:   Stress:   . Feeling of Stress :   Social Connections:   . Frequency of Communication with Friends and Family:   . Frequency of Social Gatherings with Friends and Family:   . Attends Religious Services:   . Active Member of Clubs or Organizations:   . Attends Archivist Meetings:   [name] Marital Status:     Family History  Problem Relation Age of Onset  . Kidney disease Mother   . Heart disease Father   . Anuerysm Brother 29       brain  . Heart disease Brother   . Heart disease Sister 18       s/p CABG  . Hypertension Sister   . Colon cancer Neg Hx     Review of Systems     Objective:  There were no vitals filed for this visit. BP Readings from Last 3 Encounters:  11/16/19 114/70  09/10/19 118/88  09/07/19 139/73   Wt Readings from Last 3 Encounters:  11/16/19 147 lb (66.7 kg)  09/22/19 151 lb (68.5 kg)  09/09/19 151 lb 3 oz (68.6 kg)   There is no height or weight on file to calculate BMI.   Physical Exam         Assessment & Plan:    See Problem List for Assessment and Plan of chronic medical  problems.    This visit occurred during the SARS-CoV-2 public health emergency.  Safety protocols were in place, including screening questions prior to the visit,  additional usage of staff PPE, and extensive cleaning of exam room while observing appropriate contact time as indicated for disinfecting solutions.    This encounter was created in error - please disregard.

## 2019-12-20 ENCOUNTER — Encounter: Payer: Medicare Other | Admitting: Internal Medicine

## 2019-12-20 DIAGNOSIS — M545 Low back pain: Secondary | ICD-10-CM | POA: Diagnosis not present

## 2019-12-20 DIAGNOSIS — Z79891 Long term (current) use of opiate analgesic: Secondary | ICD-10-CM | POA: Diagnosis not present

## 2019-12-20 DIAGNOSIS — G8929 Other chronic pain: Secondary | ICD-10-CM | POA: Diagnosis not present

## 2019-12-20 DIAGNOSIS — G894 Chronic pain syndrome: Secondary | ICD-10-CM | POA: Diagnosis not present

## 2019-12-20 DIAGNOSIS — M25511 Pain in right shoulder: Secondary | ICD-10-CM | POA: Diagnosis not present

## 2019-12-21 ENCOUNTER — Other Ambulatory Visit: Payer: Self-pay | Admitting: Internal Medicine

## 2019-12-22 ENCOUNTER — Ambulatory Visit: Payer: Medicare Other | Admitting: Pharmacist

## 2019-12-22 ENCOUNTER — Other Ambulatory Visit: Payer: Self-pay | Admitting: Internal Medicine

## 2019-12-22 ENCOUNTER — Other Ambulatory Visit: Payer: Self-pay

## 2019-12-22 ENCOUNTER — Telehealth: Payer: Medicare Other

## 2019-12-22 DIAGNOSIS — J439 Emphysema, unspecified: Secondary | ICD-10-CM

## 2019-12-22 DIAGNOSIS — E119 Type 2 diabetes mellitus without complications: Secondary | ICD-10-CM

## 2019-12-22 DIAGNOSIS — I10 Essential (primary) hypertension: Secondary | ICD-10-CM

## 2019-12-22 DIAGNOSIS — E7849 Other hyperlipidemia: Secondary | ICD-10-CM

## 2019-12-22 DIAGNOSIS — K219 Gastro-esophageal reflux disease without esophagitis: Secondary | ICD-10-CM

## 2019-12-22 MED ORDER — CHANTIX STARTING MONTH PAK 0.5 MG X 11 & 1 MG X 42 PO TABS: ORAL_TABLET | ORAL | 0 refills | Status: DC

## 2019-12-22 MED ORDER — TIOTROPIUM BROMIDE MONOHYDRATE 18 MCG IN CAPS: 18.0000 ug | ORAL_CAPSULE | Freq: Every day | RESPIRATORY_TRACT | 2 refills | Status: DC

## 2019-12-22 NOTE — Chronic Care Management (AMB) (Signed)
Chronic Care Management Pharmacy  Name: RHAYA COALE  MRN: [id number] DOB: Mar 29, 1956   Chief Complaint/ HPI  Jill Phillips,  64 y.o. , female presents for their Follow-Up CCM visit with the clinical pharmacist via telephone.  PCP : [name], MD  Their chronic conditions include: HTN, HLD, COPD, T2DM, CKD, GERD, Hypothyroidism, OA knees, DDD, depression/anxiety, insomnia  Knee pain, back pain, rib pain  Office Visits: 11/16/19 Dr Quay Burow OV: recurrent UTI, rx Augmentin and fluconazole. Urine cx came back contaminated, not likely true UTI. Referred to urology for continued bladder pain.  11/08/19 Dr Quay Burow VV: UTI, tx with Cipro and Azo prn.  08/19/19 Dr Jenny Reichmann OV : acute visit, rx'd zofran for nausea.  06/21/19 Dr Quay Burow OV: pain meds per ortho, looking for new pain mgmt. Increased gabapentin to 400 mg TID. Pt wanted to increase seroquel for sleep, MD declined d/t polypharmacy concerns.  Consult Visit: 11/15/19 Dr Marlou Sa (orthopedic surg): bilateral thumb pain d/t arthritis. Normal X-ray of shoulder. Plan for thumb injections at f/u.  10/13/19 Dr Marlou Sa (orthopedic surg): Shoulder surgery 09/09/19.  No issues, RTC 4 wks.  08/31/19 ED visit: shoulder pain.  Medications: Outpatient Encounter Medications as of 12/22/2019  Medication Sig Note  . acetaminophen (TYLENOL) 500 MG tablet Take 1,000 mg by mouth every 6 (six) hours as needed (for headaches.).   [name] albuterol (PROAIR HFA) 108 (90 Base) MCG/ACT inhaler INHALE 1 PUFF INTO THE LUNGS EVERY 6 HOURS AS NEEDED FOR WHEEZING OR SHORTNESS OF BREATH.NEED OFFICE VISIT FOR FURTHER REFILLS (Patient taking differently: Inhale 1 puff into the lungs every 6 (six) hours as needed for wheezing or shortness of breath. )   . amLODipine-benazepril (LOTREL) 10-40 MG capsule Take 1 capsule by mouth daily.   [name] aspirin EC 81 MG tablet Take 1 tablet (81 mg total) by mouth 2 (two) times daily.   [name] atorvastatin (LIPITOR) 20 MG tablet TAKE 1 TABLET BY MOUTH  DAILY.   . benzoyl peroxide (BENZOYL PEROXIDE) 5 % external liquid Wash affected shoulder daily with wash for 3 days prior to surgery   . cimetidine (TAGAMET) 400 MG tablet Take 400 mg by mouth 2 (two) times daily.   . clonazePAM (KLONOPIN) 1 MG tablet TAKE 1 TABLET BY MOUTH 2 TIMES DAILY AS NEEDED FOR ANXIETY   . docusate sodium (COLACE) 100 MG capsule Take 200 mg by mouth at bedtime.   . fentaNYL (DURAGESIC) 50 MCG/HR PLACE 1 PATCH ONTO THE SKIN EVERY 3 DAYS. 09/01/2019: Patient to begin this dosage 09/08/2019  . gabapentin (NEURONTIN) 400 MG capsule TAKE 1 CAPSULE BY MOUTH 3 TIMES DAILY.   [name] HYDROcodone-acetaminophen (NORCO) 10-325 MG tablet Take 1-2 tablets by mouth every 6 (six) hours as needed for severe pain.   [name] levothyroxine (SYNTHROID) 88 MCG tablet TAKE 1 TABLET BY MOUTH DAILY.   [name] NARCAN 4 MG/0.1ML LIQD nasal spray kit Place 1 spray into the nose as needed (accidental overdose).    [name] omeprazole (PRILOSEC) 40 MG capsule TAKE 1 CAPSULE BY MOUTH 2 TIMES DAILY BEFORE A MEAL. (Patient taking differently: Take 40 mg by mouth daily. )   . Sennosides (SENNA LAX PO) Take 2 tablets by mouth at bedtime.    [name] tiotropium (SPIRIVA) 18 MCG inhalation capsule Place 18 mcg into inhaler and inhale daily.   . valACYclovir (VALTREX) 500 MG tablet TAKE 1 TABLET BY MOUTH DAILY.   [name] venlafaxine XR (EFFEXOR-XR) 150 MG 24 hr capsule TAKE 2 CAPSULES BY MOUTH DAILY. (  Patient taking differently: Take 300 mg by mouth daily. )   . amoxicillin-clavulanate (AUGMENTIN) 875-125 MG tablet Take 1 tablet by mouth 2 (two) times daily.   . QUEtiapine (SEROQUEL) 100 MG tablet TAKE 1 TABLET BY MOUTH AT BEDTIME.    Facility-Administered Encounter Medications as of 12/22/2019  Medication  . calcitonin (salmon) (MIACALCIN/FORTICAL) nasal spray 1 spray     Current Diagnosis/Assessment:  SDOH Interventions     Most Recent Value  SDOH Interventions  SDOH Interventions for the Following Domains  Physical Activity  Physical  Activity Interventions  Other (Comments) [limited due to pain and breathing difficulties]      Goals Addressed            This Visit's Progress   . Cholesterol: goal LDL < 100       CARE PLAN ENTRY (see longitudinal plan of care for additional care plan information)  Current Barriers:  . Uncontrolled hyperlipidemia, complicated by HTN, COPD . Current antihyperlipidemic regimen: atorvastatin 20 mg  . Previous antihyperlipidemic medications tried n/a . Most recent lipid panel:     Component Value Date/Time   CHOL 166 11/03/2018 1518   TRIG 158.0 (H) 11/03/2018 1518   HDL 47.60 11/03/2018 1518   CHOLHDL 3 11/03/2018 1518   VLDL 31.6 11/03/2018 1518   LDLCALC 86 11/03/2018 1518   . ASCVD risk enhancing conditions:  HTN, current smoker . 10-year ASCVD risk score: 10.3%  Pharmacist Clinical Goal(s):  [name] Over the next 30 days, patient will work with PharmD and providers towards optimized antihyperlipidemic therapy  Interventions: . Comprehensive medication review performed; medication list updated in electronic medical record.  Bertram Savin care team collaboration (see longitudinal plan of care) . Patient is due for yearly labs; new cholesterol panel will help guide therapy decisions  Patient Self Care Activities:  . Patient will focus on medication adherence by pill box . Patient will schedule appt with PCP for yearly labs  Initial goal documentation     . COPD: reduce symptoms and slow progression       CARE PLAN ENTRY (see longitudinal plan of care for additional care plan information)  Current Barriers:  . Tobacco abuse of 45 years; currently smoking 1.25 ppd . Previous quit attempts, unsuccessful using  NRT . Reports smoking within 30 minutes of waking up . Reports motivation to quit smoking includes: improve health, breathing  Pharmacist Clinical Goal(s):  [name] Over the next 30 days, patient will work with PharmD and provider towards tobacco cessation and  improve inhaler use  Interventions: . Comprehensive medication review performed, medication list in electronic medical record updated . Inter-disciplinary care team collaboration (see longitudinal plan of care) . Recommended Chantix to aid with smoking cessation.  . Counseled to use Spiriva once a day regardless of symptoms  Patient Self Care Activities:  . Patient will schedule appointment with PCP for labs and to discuss Chantix . Patient will commit to reducing tobacco consumption . Patient will use inhalers as directed  Initial goal documentation     . GERD       CARE PLAN ENTRY  Current Barriers:  . Chronic Disease Management support, education, and care coordination needs related to GERD  Pharmacist Clinical Goal(s):  [name] Over the next 30 days, patient will work with PharmD and providers towards optimized GERD therapy  Interventions: . Comprehensive medication review performed. . Discussed optimal omeprazole dosing and administration; currently doing well with once daily dosing . May use Tums for breakthrough symptoms  Patient  Self Care Activities:  . Patient will reduce omeprazole to once daily and monitor for symptoms  Initial goal documentation    . Hypertension: goal BP < 130/80       CARE PLAN ENTRY (see longitudinal plan of care for additional care plan information)  Current Barriers:  . Uncontrolled hypertension, complicated by COPD, HLD, anxiety . Current antihypertensive regimen: amlodipine/benazepril 10-40 mg daily . Previous antihypertensives tried: n/a . Last practice recorded BP readings:  BP Readings from Last 3 Encounters:  11/16/19 114/70  09/10/19 118/88  09/07/19 139/73   . Current home BP readings: 130s/80s Kidney Function Lab Results  Component Value Date   CREATININE 1.13 (H) 09/07/2019   CREATININE 1.15 (H) 12/03/2018   CREATININE 1.09 11/03/2018      Component Value Date/Time   GFR 50.58 (L) 11/03/2018 1518   GFRNONAA 51 (L)  09/07/2019 1333     Pharmacist Clinical Goal(s):  [name] Over the next 30 days, patient will work with PharmD and providers to optimize antihypertensive regimen  Interventions: . Inter-disciplinary care team collaboration (see longitudinal plan of care) . Comprehensive medication review performed; medication list updated in the electronic medical record.  . Combined amlodipine and benazepril into one pill, pt reports compliance with new rx  Patient Self Care Activities:  . Patient will continue to check BP 1-2 times weekly , document, and provide at future appointments . Patient will focus on medication adherence by pill box  Initial goal documentation     . Prediabetes: maintain A1c < 6.5%       CARE PLAN ENTRY (see longitudinal plan of care for additional care plan information)  Current Barriers:  . Prediabetes; complicated by chronic medical conditions including HTN, COPD, Lab Results  Component Value Date   HGBA1C 6.3 11/03/2018 .   Lab Results  Component Value Date   CREATININE 1.13 (H) 09/07/2019   CREATININE 1.15 (H) 12/03/2018   CREATININE 1.09 11/03/2018   . Current antihyperglycemic regimen: no medications indicated  Pharmacist Clinical Goal(s):  [name] Over the next 30 days, patient will work with PharmD and primary care provider to address lifestyle changes to prevent diabetes  Interventions: . Comprehensive medication review performed, medication list updated in electronic medical record . Inter-disciplinary care team collaboration (see longitudinal plan of care) . Discussed definition of prediabetes (A1c between 5.7 - 6.4%) . Counseled on avoiding high-carb and high-sugar foods  Patient Self Care Activities:  . Patient will reduce carbohydrates in diet and increase exercise . Patient will report any questions or concerns to provider   Initial goal documentation       COPD / Asthma / Tobacco   Last spirometry score: FEV1 88% predicted, FEV/FVC  0.86  Eosinophil count:   Lab Results  Component Value Date/Time   EOSPCT 0 12/03/2018 12:42 AM  %                               Eos (Absolute):  Lab Results  Component Value Date/Time   EOSABS 0.0 12/03/2018 12:42 AM   Tobacco Status:  Social History   Tobacco Use  Smoking Status Current Every Day Smoker  . Packs/day: 1.25  . Years: 45.00  . Pack years: 56.25  . Types: Cigarettes  Smokeless Tobacco Never Used  Tobacco Comment   form given 12/25/16   Patient has failed these meds in past: n/a Patient is currently controlled on the following medications: albuterol MDI, Spiriva  Using maintenance inhaler regularly? Yes Frequency of rescue inhaler use:  1-2x per week  We discussed:  proper inhaler technique, Last visit we discussed importance of daily use of Spiriva to prevent wheezing/SOB, as patient had not been taking it. Pt reports she has been compliant with daily Spiriva and has noticed improvement in breathing/wheezing. She reports Dr Halford Chessman (pulmonary) initially prescribed this, she has not followed up with him in over 2 years so wonders if PCP can refill this for her.   Plan  Continue current medications  Request refill for Spiriva  Tobacco Abuse   Tobacco Status:  Social History   Tobacco Use  Smoking Status Current Every Day Smoker  . Packs/day: 1.25  . Years: 45.00  . Pack years: 56.25  . Types: Cigarettes  Smokeless Tobacco Never Used  Tobacco Comment   form given 12/25/16   Patient smokes After 30 minutes of waking  Patient has failed these meds in past: nicotine patches, gum  We discussed: pt wants to quit smoking and is ready to try, asked about Chantix, which would be an option. Pt does have significant history of depression/anxiety, so will need to monitor closely for behavioral or psychiatric changes. Notably, recent post-marketing studies have shown no difference in neuropsychiatric effects of Chantix vs placebo or NRT.   Of note patient is in  process of rescheduling appt with PCP for labs and to discuss Chantix.  Plan  Recommend to start Chantix for smoking cessation  Monitor closely for behavioral/psychiatric changes   Prediabetes   Recent Relevant Labs: Lab Results  Component Value Date/Time   HGBA1C 6.3 11/03/2018 03:18 PM   HGBA1C 6.4 06/04/2018 11:50 AM    No medications indicated.  Last diabetic eye exam:  Lab Results  Component Value Date/Time   HMDIABEYEEXA No Retinopathy 12/09/2017 12:31 PM   We discussed: what prediabetes means, what A1c means, how to prevent progression to DM. Discussed which foods are high in carbs and sugar that contribute to DM.  Plan  Continue control with diet and exercise    Hypertension   Office blood pressures are  BP Readings from Last 3 Encounters:  11/16/19 114/70  09/10/19 118/88  09/07/19 139/73   Patient is currently controlled on the following medications: amlodipine-benazepril 10-40 mg daily  Patient checks BP at home infrequently  Patient home BP readings are ranging: ~130/80  We discussed BP goals, importance of controlling BP for heart disease prevention. Last visit, combined amlodipine and benazepril into one tablet, pt reports she is compliant with new dose and stopped taking separate pills.  Plan  Continue current medications and control with diet and exercise    Hyperlipidemia   Lipid Panel     Component Value Date/Time   CHOL 166 11/03/2018 1518   TRIG 158.0 (H) 11/03/2018 1518   HDL 47.60 11/03/2018 1518   CHOLHDL 3 11/03/2018 1518   VLDL 31.6 11/03/2018 1518   LDLCALC 86 11/03/2018 1518   LDLDIRECT 120.0 06/04/2018 1150    ASCVD 10-year risk: 10.3%  Patient has failed these meds in past: n/a Patient is currently controlled on the following medications: atorvastatin 20 mg daily  We discussed: good vs bad cholesterol, LDL goals. Discussed LDL goal of < 70 to prevent heart disease/stroke, provided examples of foods to  avoid.  Plan  Continue current medications and control with diet and exercise   Chronic Pain/Osteoarthritis   Patient has failed these meds in past: tramadol, oxycodone/apap, methocarbamol Patient is currently controlled on the following  medications: Fentanyl patch 50 mcg/hr, hydrocodone/APAP 10-325 mg 1-2 tab q6h PRN, Tylenol 1000 mg q6h prn, Narcan PRN, gabapentin 400 mg up to TID, tizanidine 4 mg TID prn  We discussed: patient takes all pain meds exactly as prescribed. Discussed dangers of oversedation and combination of opiods/benzo. Pt does have Narcan at home, she and her daughter (who lives with her) know how and when to use it.  Plan  Continue current medications    GERD/GI   Patient has failed these meds in past: n/a Patient is currently controlled on the following medications: omeprazole 40 mg daily, cimetidine 400 mg BID, docusate 200 mg HS, senna 2 tab HS  We discussed: last visit pt was taking omeprazole BID, but denied significant issues with heartburn, so reduced dose to once daily. Pt has been compliant with reduced dose and denies GERD sx.  Plan  Continue current medications    Medication Management   Pt uses [facility] for all medications Pt uses pill box, sets up herself each week with maintenance meds. Pain meds are excluded from this Pt endorses 99% compliance - misses a dose maybe once/month  We discussed:  Benefits of medication synchronization, pill packaging and delivery with [facility]. Pt had to leave for Dr appt so will reassess pharmacy choice at follow up  Plan  Continue current medication mgmt strategy     Follow up: 1 month phone visit   Charlene Brooke, PharmD Clinical Pharmacist [facility] [phone]

## 2019-12-22 NOTE — Patient Instructions (Addendum)
Visit Information  Thank you for meeting with me to discuss your medications! I look forward to working with you to achieve your health care goals. Below is a summary of what we talked about during the visit:  Goals Addressed            This Visit's Progress   . Cholesterol: goal LDL < 100       CARE PLAN ENTRY (see longitudinal plan of care for additional care plan information)  Current Barriers:  . Uncontrolled hyperlipidemia, complicated by HTN, COPD . Current antihyperlipidemic regimen: atorvastatin 20 mg  . Previous antihyperlipidemic medications tried n/a . Most recent lipid panel:     Component Value Date/Time   CHOL 166 11/03/2018 1518   TRIG 158.0 (H) 11/03/2018 1518   HDL 47.60 11/03/2018 1518   CHOLHDL 3 11/03/2018 1518   VLDL 31.6 11/03/2018 1518   LDLCALC 86 11/03/2018 1518   . ASCVD risk enhancing conditions:  HTN, current smoker . 10-year ASCVD risk score: 10.3%  Pharmacist Clinical Goal(s):  [name] Over the next 30 days, patient will work with PharmD and providers towards optimized antihyperlipidemic therapy  Interventions: . Comprehensive medication review performed; medication list updated in electronic medical record.  Bertram Savin care team collaboration (see longitudinal plan of care) . Patient is due for yearly labs; new cholesterol panel will help guide therapy decisions  Patient Self Care Activities:  . Patient will focus on medication adherence by pill box . Patient will schedule appt with PCP for yearly labs  Initial goal documentation     . COPD: reduce symptoms and slow progression       CARE PLAN ENTRY (see longitudinal plan of care for additional care plan information)  Current Barriers:  . Tobacco abuse of 45 years; currently smoking 1.25 ppd . Previous quit attempts, unsuccessful using  NRT . Reports smoking within 30 minutes of waking up . Reports motivation to quit smoking includes: improve health, breathing  Pharmacist  Clinical Goal(s):  [name] Over the next 30 days, patient will work with PharmD and provider towards tobacco cessation and improve inhaler use  Interventions: . Comprehensive medication review performed, medication list in electronic medical record updated . Inter-disciplinary care team collaboration (see longitudinal plan of care) . Recommended Chantix to aid with smoking cessation.  . Counseled to use Spiriva once a day regardless of symptoms  Patient Self Care Activities:  . Patient will schedule appointment with PCP for labs and to discuss Chantix . Patient will commit to reducing tobacco consumption . Patient will use inhalers as directed  Initial goal documentation     . GERD       CARE PLAN ENTRY  Current Barriers:  . Chronic Disease Management support, education, and care coordination needs related to GERD  Pharmacist Clinical Goal(s):  [name] Over the next 30 days, patient will work with PharmD and providers towards optimized GERD therapy  Interventions: . Comprehensive medication review performed. . Discussed optimal omeprazole dosing and administration; currently doing well with once daily dosing . May use Tums for breakthrough symptoms  Patient Self Care Activities:  . Patient will reduce omeprazole to once daily and monitor for symptoms  Initial goal documentation    . Hypertension: goal BP < 130/80       CARE PLAN ENTRY (see longitudinal plan of care for additional care plan information)  Current Barriers:  . Uncontrolled hypertension, complicated by COPD, HLD, anxiety . Current antihypertensive regimen: amlodipine/benazepril 10-40 mg daily . Previous antihypertensives tried:  n/a . Last practice recorded BP readings:  BP Readings from Last 3 Encounters:  11/16/19 114/70  09/10/19 118/88  09/07/19 139/73   . Current home BP readings: 130s/80s Kidney Function Lab Results  Component Value Date   CREATININE 1.13 (H) 09/07/2019   CREATININE 1.15 (H) 12/03/2018    CREATININE 1.09 11/03/2018      Component Value Date/Time   GFR 50.58 (L) 11/03/2018 1518   GFRNONAA 51 (L) 09/07/2019 1333     Pharmacist Clinical Goal(s):  [name] Over the next 30 days, patient will work with PharmD and providers to optimize antihypertensive regimen  Interventions: . Inter-disciplinary care team collaboration (see longitudinal plan of care) . Comprehensive medication review performed; medication list updated in the electronic medical record.  . Combined amlodipine and benazepril into one pill, pt reports compliance with new rx  Patient Self Care Activities:  . Patient will continue to check BP 1-2 times weekly , document, and provide at future appointments . Patient will focus on medication adherence by pill box  Initial goal documentation     . Prediabetes: maintain A1c < 6.5%       CARE PLAN ENTRY (see longitudinal plan of care for additional care plan information)  Current Barriers:  . Prediabetes; complicated by chronic medical conditions including HTN, COPD, Lab Results  Component Value Date   HGBA1C 6.3 11/03/2018 .   Lab Results  Component Value Date   CREATININE 1.13 (H) 09/07/2019   CREATININE 1.15 (H) 12/03/2018   CREATININE 1.09 11/03/2018   . Current antihyperglycemic regimen: no medications indicated  Pharmacist Clinical Goal(s):  [name] Over the next 30 days, patient will work with PharmD and primary care provider to address lifestyle changes to prevent diabetes  Interventions: . Comprehensive medication review performed, medication list updated in electronic medical record . Inter-disciplinary care team collaboration (see longitudinal plan of care) . Discussed definition of prediabetes (A1c between 5.7 - 6.4%) . Counseled on avoiding high-carb and high-sugar foods  Patient Self Care Activities:  . Patient will reduce carbohydrates in diet and increase exercise . Patient will report any questions or concerns to provider   Initial goal  documentation        Ms. Diop was given information about Chronic Care Management services today including:  1. CCM service includes personalized support from designated clinical staff supervised by her physician, including individualized plan of care and coordination with other care providers 2. 24/7 contact phone numbers for assistance for urgent and routine care needs. 3. Standard insurance, coinsurance, copays and deductibles apply for chronic care management only during months in which we provide at least 20 minutes of these services. Most insurances cover these services at 100%, however patients may be responsible for any copay, coinsurance and/or deductible if applicable. This service may help you avoid the need for more expensive face-to-face services. 4. Only one practitioner may furnish and bill the service in a calendar month. 5. The patient may stop CCM services at any time (effective at the end of the month) by phone call to the office staff.  Patient agreed to services and verbal consent obtained.   The patient verbalized understanding of instructions provided today and agreed to receive a mailed copy of patient instruction and/or educational materials. Telephone follow up appointment with pharmacy team member scheduled for: 1 month  Charlene Brooke, PharmD Clinical Pharmacist [facility] [phone]  Coping with Quitting Smoking  Quitting smoking is a physical and mental challenge. You will face  cravings, withdrawal symptoms, and temptation. Before quitting, work with your health care provider to make a plan that can help you cope. Preparation can help you quit and keep you from giving in. How can I cope with cravings? Cravings usually last for 5-10 minutes. If you get through it, the craving will pass. Consider taking the following actions to help you cope with cravings:  Keep your mouth busy: ? Chew sugar-free gum. ? Suck on hard candies or  a straw. ? Brush your teeth.  Keep your hands and body busy: ? Immediately change to a different activity when you feel a craving. ? Squeeze or play with a ball. ? Do an activity or a hobby, like making bead jewelry, practicing needlepoint, or working with wood. ? Mix up your normal routine. ? Take a short exercise break. Go for a quick walk or run up and down stairs. ? Spend time in public places where smoking is not allowed.  Focus on doing something kind or helpful for someone else.  Call a friend or family member to talk during a craving.  Join a support group.  Call a quit line, such as 1-800-QUIT-NOW.  Talk with your health care provider about medicines that might help you cope with cravings and make quitting easier for you. How can I deal with withdrawal symptoms? Your body may experience negative effects as it tries to get used to not having nicotine in the system. These effects are called withdrawal symptoms. They may include:  Feeling hungrier than normal.  Trouble concentrating.  Irritability.  Trouble sleeping.  Feeling depressed.  Restlessness and agitation.  Craving a cigarette. To manage withdrawal symptoms:  Avoid places, people, and activities that trigger your cravings.  Remember why you want to quit.  Get plenty of sleep.  Avoid coffee and other caffeinated drinks. These may worsen some of your symptoms. How can I handle social situations? Social situations can be difficult when you are quitting smoking, especially in the first few weeks. To manage this, you can:  Avoid parties, bars, and other social situations where people might be smoking.  Avoid alcohol.  Leave right away if you have the urge to smoke.  Explain to your family and friends that you are quitting smoking. Ask for understanding and support.  Plan activities with friends or family where smoking is not an option. What are some ways I can cope with stress? Wanting to smoke may  cause stress, and stress can make you want to smoke. Find ways to manage your stress. Relaxation techniques can help. For example:  Breathe slowly and deeply, in through your nose and out through your mouth.  Listen to soothing, relaxing music.  Talk with a family member or friend about your stress.  Light a candle.  Soak in a bath or take a shower.  Think about a peaceful place. What are some ways I can prevent weight gain? Be aware that many people gain weight after they quit smoking. However, not everyone does. To keep from gaining weight, have a plan in place before you quit and stick to the plan after you quit. Your plan should include:  Having healthy snacks. When you have a craving, it may help to: ? Eat plain popcorn, crunchy carrots, celery, or other cut vegetables. ? Chew sugar-free gum.  Changing how you eat: ? Eat small portion sizes at meals. ? Eat 4-6 small meals throughout the day instead of 1-2 large meals a day. ? Be mindful when you  eat. Do not watch television or do other things that might distract you as you eat.  Exercising regularly: ? Make time to exercise each day. If you do not have time for a long workout, do short bouts of exercise for 5-10 minutes several times a day. ? Do some form of strengthening exercise, like weight lifting, and some form of aerobic exercise, like running or swimming.  Drinking plenty of water or other low-calorie or no-calorie drinks. Drink 6-8 glasses of water daily, or as much as instructed by your health care provider. Summary  Quitting smoking is a physical and mental challenge. You will face cravings, withdrawal symptoms, and temptation to smoke again. Preparation can help you as you go through these challenges.  You can cope with cravings by keeping your mouth busy (such as by chewing gum), keeping your body and hands busy, and making calls to family, friends, or a helpline for people who want to quit smoking.  You can cope  with withdrawal symptoms by avoiding places where people smoke, avoiding drinks with caffeine, and getting plenty of rest.  Ask your health care provider about the different ways to prevent weight gain, avoid stress, and handle social situations. This information is not intended to replace advice given to you by your health care provider. Make sure you discuss any questions you have with your health care provider. Document Revised: 08/01/2017 Document Reviewed: 08/16/2016 Elsevier Patient Education  2020 [employer].

## 2019-12-27 ENCOUNTER — Telehealth: Payer: Self-pay | Admitting: Internal Medicine

## 2019-12-27 DIAGNOSIS — R300 Dysuria: Secondary | ICD-10-CM

## 2019-12-27 NOTE — Telephone Encounter (Signed)
New message:   Pt is calling and states a antibiotic was called in for her for a UTI. Pt states she is still hurting and it still hurts really bad. She would like another antibiotic called in for her if possible. Please.

## 2019-12-27 NOTE — Telephone Encounter (Signed)
Pt states that she will see a urologist. Put in orders for her to do a urine sample at [facility] location.

## 2019-12-27 NOTE — Telephone Encounter (Signed)
She needs to bring in a sample - I do not think another antibiotic will help - I do not think she has an infection.  Does she want to see urology?

## 2019-12-30 ENCOUNTER — Ambulatory Visit (INDEPENDENT_AMBULATORY_CARE_PROVIDER_SITE_OTHER): Payer: Medicare Other | Admitting: Specialist

## 2019-12-30 ENCOUNTER — Encounter: Payer: Self-pay | Admitting: Specialist

## 2019-12-30 ENCOUNTER — Other Ambulatory Visit (INDEPENDENT_AMBULATORY_CARE_PROVIDER_SITE_OTHER): Payer: Medicare Other

## 2019-12-30 ENCOUNTER — Ambulatory Visit: Payer: Self-pay

## 2019-12-30 ENCOUNTER — Other Ambulatory Visit: Payer: Self-pay

## 2019-12-30 VITALS — BP 139/86 | HR 102 | Ht 64.0 in | Wt 147.0 lb

## 2019-12-30 DIAGNOSIS — M5414 Radiculopathy, thoracic region: Secondary | ICD-10-CM | POA: Diagnosis not present

## 2019-12-30 DIAGNOSIS — M545 Low back pain, unspecified: Secondary | ICD-10-CM

## 2019-12-30 DIAGNOSIS — M4325 Fusion of spine, thoracolumbar region: Secondary | ICD-10-CM | POA: Diagnosis not present

## 2019-12-30 DIAGNOSIS — R300 Dysuria: Secondary | ICD-10-CM

## 2019-12-30 DIAGNOSIS — M5134 Other intervertebral disc degeneration, thoracic region: Secondary | ICD-10-CM

## 2019-12-30 DIAGNOSIS — M960 Pseudarthrosis after fusion or arthrodesis: Secondary | ICD-10-CM

## 2019-12-30 LAB — URINALYSIS, ROUTINE W REFLEX MICROSCOPIC

## 2019-12-30 NOTE — Patient Instructions (Signed)
Avoid frequent bending and stooping  No lifting greater than 10 lbs. May use ice or moist heat for pain. Weight loss is of benefit. MRI of the thoracic and lumbar spine to assess for cause of waist down pain and for adjacent level DDD T9 above T10 to S1 fusion.  Exercise is important to improve your indurance and does allow people to function better inspite of back pain.

## 2019-12-30 NOTE — Progress Notes (Signed)
Office Visit Note   Patient: Jill Phillips           Date of Birth: October 07, 1955           MRN: [id number] Visit Date: 12/30/2019              Requested by: [name], MD [place],  [place] [id number] PCP: [name], MD   Assessment & Plan: Visit Diagnoses:  1. Low back pain, unspecified back pain laterality, unspecified chronicity, unspecified whether sciatica present   2. 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 Neuropathy, thoracic (radicular)   4. Fusion of spine of thoracolumbar region   5. Degenerative disc disease, thoracic     Plan: Avoid frequent bending and stooping  No lifting greater than 10 lbs. May use ice or moist heat for pain. Weight loss is of benefit. MRI of the thoracic and lumbar spine to assess for cause of waist down pain and for adjacent level DDD T9 above T10 to S1 fusion.  Exercise is important to improve your indurance and does allow people to function better inspite of back pain.  Follow-Up Instructions: Return in about 3 weeks (around 01/20/2020).   Orders:  Orders Placed This Encounter  Procedures  . XR Lumbar Spine 2-3 Views   No orders of the defined types were placed in this encounter.     Procedures: No procedures performed   Clinical Data: No additional findings.   Subjective: Chief Complaint  Patient presents with  . Lower Back - Pain    64 year old female with history of T10 to S1 fusions for DDD on the lumbar spine and arthritis. She has a thoracolumbar scoliosis left sided above the fusions. Never had good pain relief following the fusion in 2018 and has undergone left reverse shoulder arthroplasty. Has been in pain management with Dr. Mirna Mires with Pain management in [place]. She has been experiencing bladder symptoms with dysuria and interstitial cystitis like complaints Has been on severel antibiotics and had a recent UA and culture.    Review of Systems   Objective: Vital Signs: BP  139/86 (BP Location: Left Arm, Patient Position: Sitting)   Pulse (!) 102   Ht 5\' 4"  (1.626 m)   Wt 147 lb (66.7 kg)   BMI 25.23 kg/m   Physical Exam Constitutional:      Appearance: She is well-developed.  HENT:     Head: Normocephalic and atraumatic.  Eyes:     Pupils: Pupils are equal, round, and reactive to light.  Pulmonary:     Effort: Pulmonary effort is normal.     Breath sounds: Normal breath sounds.  Abdominal:     General: Bowel sounds are normal.     Palpations: Abdomen is soft.  Musculoskeletal:        General: Normal range of motion.     Cervical back: Normal range of motion and neck supple.     Lumbar back: Negative right straight leg raise test and negative left straight leg raise test.  Skin:    General: Skin is warm and dry.  Neurological:     Mental Status: She is alert and oriented to person, place, and time.  Psychiatric:        Behavior: Behavior normal.        Thought Content: Thought content normal.        Judgment: Judgment normal.     Back Exam   Tenderness  The patient is  experiencing tenderness in the lumbar and thoracic.  Range of Motion  Extension: normal  Flexion: normal  Lateral bend right: normal  Lateral bend left: normal  Rotation right: normal  Rotation left: normal   Muscle Strength  Right Quadriceps:  5/5  Left Quadriceps:  5/5  Right Hamstrings:  5/5  Left Hamstrings:  5/5   Tests  Straight leg raise right: negative Straight leg raise left: negative  Reflexes  Patellar: 2/4 Biceps: 2/4  Other  Toe walk: normal Heel walk: normal Sensation: normal Gait: normal  Erythema: no back redness Scars: absent      Specialty Comments:  No specialty comments available.  Imaging: XR Lumbar Spine 2-3 Views  Result Date: 12/30/2019 AP and lateral flexion and extension radiographs show no significant motion from T10 to L3. There is no sign of abnormal movement across these levels.    PMFS History: Patient  Active Problem List   Diagnosis Date Noted  . Degenerative disc disease, lumbar 08/01/2015    Priority: High    Class: Chronic  . Spondylolisthesis of lumbar region 08/01/2015    Priority: High    Class: Chronic  . Acute cystitis without hematuria 11/08/2019  . Status post shoulder surgery 09/09/2019  . Nausea 08/19/2019  . Anxiety 05/04/2019  . Insomnia 05/04/2019  . Headache 12/08/2018  . Syncope 12/08/2018  . COPD with exacerbation ([place]) 11/26/2018  . Disorder of rotator cuff syndrome of left shoulder and allied disorder 06/22/2018  . Leg edema, left 06/04/2018  . Type 2 diabetes mellitus without complication, without long-term current use of insulin ([place]) 12/02/2017  . Sacral pain 09/17/2017  . COPD (chronic obstructive pulmonary disease) ([place]) 04/21/2017  . Lower back pain 03/03/2017  . CKD (chronic kidney disease) stage 3, GFR 30-59 ml/min 08/07/2016  . GERD (gastroesophageal reflux disease) 08/07/2016  . Chronic respiratory failure ([place]) 07/29/2016  . Arthritis of carpometacarpal [facility]) joint of left thumb 07/09/2016  . Pneumothorax on left 01/31/2016  . Chronic, continuous use of opioids 09/06/2015  . Urinary, incontinence, stress female 05/06/2014  . Constipation 05/05/2014  . 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 HTN (hypertension) 05/19/2013  . HLD (hyperlipidemia) 05/19/2013  . Depression 05/19/2013  . Hypothyroidism 05/19/2013  . Tobacco abuse   . Osteoarthritis of both knees 11/18/2011  . RLS (restless legs syndrome) 11/18/2011   Past Medical History:  Diagnosis Date  . Active smoker   . Anxiety   . Calcifying tendinitis of shoulder   . Chronic back pain   . Chronic pain syndrome   . COPD (chronic obstructive pulmonary disease) ([place])   . Dysthymic disorder   . Emphysema lung ([place])   . GERD (gastroesophageal reflux disease)   . Headache    "weekly maybe" (01/31/2016)  . Heart murmur    for years, nothing to be concerned about  . Herpes genitalia   . History of  blood transfusion 1980   related to "back surgery"  . Hyperlipidemia   . Hypertension   . Hypothyroidism   . Lumbago   . Osteoarthrosis, unspecified whether generalized or localized, lower leg   . Pain in joint, upper arm   . Pneumothorax, left 01/31/2016   S/P Left posterior subcostal pain injection on 01/30/2016  . PONV (postoperative nausea and vomiting)    gets nauseous  with longer surgery. Difficuty voiding after surgery  . Postlaminectomy syndrome, thoracic region   . Primary localized osteoarthrosis, lower leg   . Restless leg syndrome   . Sleep apnea  s/p surgery- last sleep study 2011- doesnt use oxygen or machine at night as instructed,.   12/2014- Dr Halford Chessman  reports it is negative.  . Thyroid disease     Family History  Problem Relation Age of Onset  . Kidney disease Mother   . Heart disease Father   . Anuerysm Brother 29       brain  . Heart disease Brother   . Heart disease Sister 54       s/p CABG  . Hypertension Sister   . Colon cancer Neg Hx     Past Surgical History:  Procedure Laterality Date  . APPENDECTOMY    . BACK SURGERY     18 back surgeries (2 thoracic & 16 lumbar) (01/31/2016)  . DILATION AND CURETTAGE OF UTERUS    . HAMMER TOE SURGERY    . IR RADIOLOGIST EVAL & MGMT  07/21/2017  . JOINT REPLACEMENT    . KNEE ARTHROSCOPY Right   . LAPAROSCOPIC CHOLECYSTECTOMY    . LUMBAR FUSION N/A 08/01/2015   Procedure: Right sided L1-2 and L2-3 transforaminal lumbar interbody fusion with cages, Extension of posterior fusion T12 to L3, Replaced pedicle screws bilaterally L1-L2 , Replaced left sided pedicle screws L-3. Instrumentation T12 to L3 using local bone graft, Vivigen allograft and cancellous chips;  Surgeon: [name], MD;  Location: [place];  Service: Orthopedics;  Laterality: N/A;  . LUMBAR LAMINECTOMY/DECOMPRESSION MICRODISCECTOMY N/A 01/28/2014   Procedure: Minimally Invasive Right  L1-2 Microdiscectomy;  Surgeon: [name], MD;  Location: [place];   Service: Orthopedics;  Laterality: N/A;  . REVERSE SHOULDER ARTHROPLASTY Left 09/09/2019   Procedure: LEFT REVERSE SHOULDER REPLACEMENT;  Surgeon: Meredith Pel, MD;  Location: [facility];  Service: Orthopedics;  Laterality: Left;  . TOTAL HIP ARTHROPLASTY Right   . TOTAL KNEE ARTHROPLASTY  05/29/2012   Procedure: TOTAL KNEE ARTHROPLASTY;  Surgeon: Mcarthur Rossetti, MD;  Location: [facility];  Service: Orthopedics;  Laterality: Right;  Right Total Knee Arthroplasty  . TUBAL LIGATION    . UVULOPALATOPHARYNGOPLASTY    . VAGINAL HYSTERECTOMY     Social History   Occupational History  . Occupation: disability    Comment: back surgeries  Tobacco Use  . Smoking status: Current Every Day Smoker    Packs/day: 1.25    Years: 45.00    Pack years: 56.25    Types: Cigarettes  . Smokeless tobacco: Never Used  . Tobacco comment: form given 12/25/16  Substance and Sexual Activity  . Alcohol use: No    Alcohol/week: 0.0 standard drinks  . Drug use: No    Types: Marijuana    Comment: 01/31/2016 "none since ~ 1980"  . Sexual activity: Never    Partners: Male

## 2019-12-31 ENCOUNTER — Telehealth: Payer: Self-pay | Admitting: Internal Medicine

## 2019-12-31 LAB — URINE CULTURE
MICRO NUMBER:: 10420943
SPECIMEN QUALITY:: ADEQUATE

## 2019-12-31 MED ORDER — PROMETHAZINE HCL 25 MG PO TABS: 25.0000 mg | ORAL_TABLET | Freq: Three times a day (TID) | ORAL | 0 refills | Status: DC | PRN

## 2019-12-31 NOTE — Telephone Encounter (Signed)
    Patient calling to discuss lab results Please call

## 2019-12-31 NOTE — Telephone Encounter (Signed)
Urine was dark orange from AZO making interpretation difficult.  Preliminary results are limited, but it does not appear that she has an infection.  She did wait until culture before considering an antibiotic.

## 2019-12-31 NOTE — Telephone Encounter (Signed)
sent 

## 2019-12-31 NOTE — Telephone Encounter (Signed)
Advised that urine was not easy to read due to the amount of AZO. I did tell her she needs to see urology to follow up on the problem she is having. She will wait to hear from them. Would like to see if she can have phenergan called in for nausea. [facility] before 1:00 and [facility] on [address] after 1:00.

## 2019-12-31 NOTE — Telephone Encounter (Signed)
Please advise on UA

## 2020-01-01 ENCOUNTER — Encounter: Payer: Self-pay | Admitting: Internal Medicine

## 2020-01-03 ENCOUNTER — Other Ambulatory Visit: Payer: Self-pay | Admitting: Internal Medicine

## 2020-01-04 ENCOUNTER — Other Ambulatory Visit: Payer: Self-pay | Admitting: Internal Medicine

## 2020-01-04 ENCOUNTER — Telehealth: Payer: Self-pay | Admitting: Internal Medicine

## 2020-01-04 NOTE — Telephone Encounter (Signed)
Will you check on the status of this please. Thank you!

## 2020-01-04 NOTE — Telephone Encounter (Signed)
New message:   Pt is calling and states she needs to be referred to a different urologist due to not receiving a call back from the other office. Please advise.

## 2020-01-04 NOTE — Telephone Encounter (Signed)
1/2 of her prescription filled - needs f/u appt

## 2020-01-04 NOTE — Telephone Encounter (Signed)
Last OV 06/21/19, next visit n/a, last refill on 12/09/19

## 2020-01-05 ENCOUNTER — Other Ambulatory Visit: Payer: Self-pay | Admitting: Internal Medicine

## 2020-01-05 DIAGNOSIS — R8271 Bacteriuria: Secondary | ICD-10-CM | POA: Diagnosis not present

## 2020-01-05 DIAGNOSIS — R350 Frequency of micturition: Secondary | ICD-10-CM | POA: Diagnosis not present

## 2020-01-05 DIAGNOSIS — R300 Dysuria: Secondary | ICD-10-CM | POA: Diagnosis not present

## 2020-01-05 NOTE — Telephone Encounter (Signed)
Spoke to pt - she will contact [facility] and reschedule a previous appt she missed

## 2020-01-06 NOTE — Telephone Encounter (Signed)
Old note closing chart 

## 2020-01-07 ENCOUNTER — Telehealth: Payer: Self-pay | Admitting: Internal Medicine

## 2020-01-07 NOTE — Telephone Encounter (Signed)
LVM giving response below.

## 2020-01-07 NOTE — Telephone Encounter (Signed)
Can add flonase nasal spray and otc allergy eye drops.   Can take zyrtec instead of benadryl which is less sedating.

## 2020-01-07 NOTE — Telephone Encounter (Signed)
   Patient seeking advice on OTC allergy medication. Currently taking Benadryl for allergies. Itchy eyes, running eyes Declined appointment at this time, advice only

## 2020-01-11 NOTE — Telephone Encounter (Signed)
    Patient calling for status of refill. Patient takes 2 pills daily.

## 2020-01-12 ENCOUNTER — Other Ambulatory Visit: Payer: Self-pay | Admitting: Internal Medicine

## 2020-01-12 NOTE — Telephone Encounter (Signed)
Would you mind calling pt and scheduling a follow up for her? She is due to a follow up which is why she only got a half supply. Thank you

## 2020-01-16 NOTE — Progress Notes (Signed)
Subjective:    Patient ID: Jill Phillips, female    DOB: [birth year], 64 y.o.   MRN: [id number]  HPI The patient is here for follow up of their chronic medical problems, including diabetes, hypertension, hyperlipidemia, hypothyroidism, gerd, depression, RLS, anxiety, insomnia, chronic pain.  She is taking all of her medications as prescribed.   She is not exercising regularly.    She is still smoking.    Medications and allergies reviewed with patient and updated if appropriate.  Patient Active Problem List   Diagnosis Date Noted  . Acute cystitis without hematuria 11/08/2019  . Status post shoulder surgery 09/09/2019  . Nausea 08/19/2019  . Anxiety 05/04/2019  . Insomnia 05/04/2019  . Headache 12/08/2018  . Syncope 12/08/2018  . COPD with exacerbation ([place]) 11/26/2018  . Disorder of rotator cuff syndrome of left shoulder and allied disorder 06/22/2018  . Leg edema, left 06/04/2018  . Type 2 diabetes mellitus without complication, without long-term current use of insulin ([place]) 12/02/2017  . Sacral pain 09/17/2017  . COPD (chronic obstructive pulmonary disease) ([place]) 04/21/2017  . Lower back pain 03/03/2017  . CKD (chronic kidney disease) stage 3, GFR 30-59 ml/min 08/07/2016  . GERD (gastroesophageal reflux disease) 08/07/2016  . Chronic respiratory failure ([place]) 07/29/2016  . Arthritis of carpometacarpal [facility]) joint of left thumb 07/09/2016  . Pneumothorax on left 01/31/2016  . Chronic, continuous use of opioids 09/06/2015  . Degenerative disc disease, lumbar 08/01/2015  . Spondylolisthesis of lumbar region 08/01/2015  . Urinary, incontinence, stress female 05/06/2014  . Constipation 05/05/2014  . 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 HTN (hypertension) 05/19/2013  . HLD (hyperlipidemia) 05/19/2013  . Depression 05/19/2013  . Hypothyroidism 05/19/2013  . Tobacco abuse   . Osteoarthritis of both knees 11/18/2011  . RLS (restless legs syndrome) 11/18/2011    Current  Outpatient Medications on File Prior to Visit  Medication Sig Dispense Refill  . acetaminophen (TYLENOL) 500 MG tablet Take 1,000 mg by mouth every 6 (six) hours as needed (for headaches.).    [name] albuterol (PROAIR HFA) 108 (90 Base) MCG/ACT inhaler INHALE 1 PUFF INTO THE LUNGS EVERY 6 HOURS AS NEEDED FOR WHEEZING OR SHORTNESS OF BREATH.NEED OFFICE VISIT FOR FURTHER REFILLS (Patient taking differently: Inhale 1 puff into the lungs every 6 (six) hours as needed for wheezing or shortness of breath. ) 8.5 g 0  . amLODipine-benazepril (LOTREL) 10-40 MG capsule Take 1 capsule by mouth daily. 90 capsule 1  . aspirin EC 81 MG tablet Take 1 tablet (81 mg total) by mouth 2 (two) times daily. 60 tablet 0  . atorvastatin (LIPITOR) 20 MG tablet TAKE 1 TABLET BY MOUTH DAILY. 90 tablet 1  . benzoyl peroxide (BENZOYL PEROXIDE) 5 % external liquid Wash affected shoulder daily with wash for 3 days prior to surgery 142 g 0  . cimetidine (TAGAMET) 400 MG tablet Take 400 mg by mouth 2 (two) times daily.    . clonazePAM (KLONOPIN) 1 MG tablet TAKE 1 TABLET BY MOUTH 2 TIMES DAILY AS NEEDED FOR ANXIETY 30 tablet 0  . docusate sodium (COLACE) 100 MG capsule Take 200 mg by mouth at bedtime.    . fentaNYL (DURAGESIC) 50 MCG/HR PLACE 1 PATCH ONTO THE SKIN EVERY 3 DAYS. 10 patch 0  . gabapentin (NEURONTIN) 400 MG capsule TAKE 1 CAPSULE BY MOUTH 3 TIMES DAILY. 270 capsule 1  . HYDROcodone-acetaminophen (NORCO) 10-325 MG tablet Take 1-2 tablets by mouth every 6 (six) hours as needed for severe pain.  40 tablet 0  . levothyroxine (SYNTHROID) 88 MCG tablet TAKE 1 TABLET BY MOUTH DAILY. 90 tablet 1  . NARCAN 4 MG/0.1ML LIQD nasal spray kit Place 1 spray into the nose as needed (accidental overdose).     [name] omeprazole (PRILOSEC) 40 MG capsule TAKE 1 CAPSULE BY MOUTH 2 TIMES DAILY BEFORE A MEAL. (Patient taking differently: Take 40 mg by mouth daily. ) 180 capsule 3  . promethazine (PHENERGAN) 25 MG tablet Take 1 tablet (25 mg total) by  mouth every 8 (eight) hours as needed for nausea or vomiting. 20 tablet 0  . QUEtiapine (SEROQUEL) 100 MG tablet Take 1 tablet (100 mg total) by mouth at bedtime. Need office visit for more refills. 30 tablet 20  . Sennosides (SENNA LAX PO) Take 2 tablets by mouth at bedtime.     [name] tiotropium (SPIRIVA) 18 MCG inhalation capsule Place 1 capsule (18 mcg total) into inhaler and inhale daily. 30 capsule 2  . valACYclovir (VALTREX) 500 MG tablet TAKE 1 TABLET BY MOUTH DAILY. 90 tablet 0  . varenicline (CHANTIX STARTING MONTH PAK) 0.5 MG X 11 & 1 MG X 42 tablet Take one 0.5 mg tablet by mouth once daily for 3 days, then increase to one 0.5 mg tablet twice daily for 4 days, then increase to one 1 mg tablet twice daily. 53 tablet 0  . venlafaxine XR (EFFEXOR-XR) 150 MG 24 hr capsule TAKE 2 CAPSULES BY MOUTH DAILY. 180 capsule 0   Current Facility-Administered Medications on File Prior to Visit  Medication Dose Route Frequency Provider Last Rate Last Admin  . calcitonin (salmon) (MIACALCIN/FORTICAL) nasal spray 1 spray  1 spray Alternating Nares Daily [name], MD        Past Medical History:  Diagnosis Date  . Active smoker   . Anxiety   . Calcifying tendinitis of shoulder   . Chronic back pain   . Chronic pain syndrome   . COPD (chronic obstructive pulmonary disease) ([place])   . Dysthymic disorder   . Emphysema lung ([place])   . GERD (gastroesophageal reflux disease)   . Headache    "weekly maybe" (01/31/2016)  . Heart murmur    for years, nothing to be concerned about  . Herpes genitalia   . History of blood transfusion 1980   related to "back surgery"  . Hyperlipidemia   . Hypertension   . Hypothyroidism   . Lumbago   . Osteoarthrosis, unspecified whether generalized or localized, lower leg   . Pain in joint, upper arm   . Pneumothorax, left 01/31/2016   S/P Left posterior subcostal pain injection on 01/30/2016  . PONV (postoperative nausea and vomiting)    gets nauseous  with longer  surgery. Difficuty voiding after surgery  . Postlaminectomy syndrome, thoracic region   . Primary localized osteoarthrosis, lower leg   . Restless leg syndrome   . Sleep apnea    s/p surgery- last sleep study 2011- doesnt use oxygen or machine at night as instructed,.   12/2014- Dr Halford Chessman  reports it is negative.  . Thyroid disease     Past Surgical History:  Procedure Laterality Date  . APPENDECTOMY    . BACK SURGERY     18 back surgeries (2 thoracic & 16 lumbar) (01/31/2016)  . DILATION AND CURETTAGE OF UTERUS    . HAMMER TOE SURGERY    . IR RADIOLOGIST EVAL & MGMT  07/21/2017  . JOINT REPLACEMENT    . KNEE ARTHROSCOPY Right   .  LAPAROSCOPIC CHOLECYSTECTOMY    . LUMBAR FUSION N/A 08/01/2015   Procedure: Right sided L1-2 and L2-3 transforaminal lumbar interbody fusion with cages, Extension of posterior fusion T12 to L3, Replaced pedicle screws bilaterally L1-L2 , Replaced left sided pedicle screws L-3. Instrumentation T12 to L3 using local bone graft, Vivigen allograft and cancellous chips;  Surgeon: [name], MD;  Location: [place];  Service: Orthopedics;  Laterality: N/A;  . LUMBAR LAMINECTOMY/DECOMPRESSION MICRODISCECTOMY N/A 01/28/2014   Procedure: Minimally Invasive Right  L1-2 Microdiscectomy;  Surgeon: [name], MD;  Location: [place];  Service: Orthopedics;  Laterality: N/A;  . REVERSE SHOULDER ARTHROPLASTY Left 09/09/2019   Procedure: LEFT REVERSE SHOULDER REPLACEMENT;  Surgeon: Meredith Pel, MD;  Location: [place];  Service: Orthopedics;  Laterality: Left;  . TOTAL HIP ARTHROPLASTY Right   . TOTAL KNEE ARTHROPLASTY  05/29/2012   Procedure: TOTAL KNEE ARTHROPLASTY;  Surgeon: Mcarthur Rossetti, MD;  Location[facility];  Service: Orthopedics;  Laterality: Right;  Right Total Knee Arthroplasty  . TUBAL LIGATION    . UVULOPALATOPHARYNGOPLASTY    . VAGINAL HYSTERECTOMY      Social History   Socioeconomic History  . Marital status: Divorced    Spouse name: n/a  .  Number of children: 2  . Years of education: 12+  . Highest education level: Not on file  Occupational History  . Occupation: disability    Comment: back surgeries  Tobacco Use  . Smoking status: Current Every Day Smoker    Packs/day: 1.25    Years: 45.00    Pack years: 56.25    Types: Cigarettes  . Smokeless tobacco: Never Used  . Tobacco comment: form given 12/25/16  Substance and Sexual Activity  . Alcohol use: No    Alcohol/week: 0.0 standard drinks  . Drug use: No    Types: Marijuana    Comment: 01/31/2016 "none since ~ 1980"  . Sexual activity: Never    Partners: Male  Other Topics Concern  . Not on file  Social History Narrative   Lives alone.  One daughter is local, but is getting ready to move to [place], where her children live with their father.  The other daughter lives near [place], [place].   Social Determinants of Health   Financial Resource Strain:   . Difficulty of Paying Living Expenses:   Food Insecurity:   . Worried About Charity fundraiser in the Last Year:   . Arboriculturist in the Last Year:   Transportation Needs:   . Film/video editor (Medical):   [name] Lack of Transportation (Non-Medical):   Physical Activity: Inactive  . Days of Exercise per Week: 0 days  . Minutes of Exercise per Session: 0 min  Stress:   . Feeling of Stress :   Social Connections:   . Frequency of Communication with Friends and Family:   . Frequency of Social Gatherings with Friends and Family:   . Attends Religious Services:   . Active Member of Clubs or Organizations:   . Attends Archivist Meetings:   [name] Marital Status:     Family History  Problem Relation Age of Onset  . Kidney disease Mother   . Heart disease Father   . Anuerysm Brother 29       brain  . Heart disease Brother   . Heart disease Sister 74       s/p CABG  . Hypertension Sister   . Colon cancer Neg Hx  Review of Systems     Objective:  There were no vitals filed for this  visit. BP Readings from Last 3 Encounters:  12/30/19 139/86  11/16/19 114/70  09/10/19 118/88   Wt Readings from Last 3 Encounters:  12/30/19 147 lb (66.7 kg)  11/16/19 147 lb (66.7 kg)  09/22/19 151 lb (68.5 kg)   There is no height or weight on file to calculate BMI.   Physical Exam    Constitutional: Appears well-developed and well-nourished. No distress.  HENT:  Head: Normocephalic and atraumatic.  Neck: Neck supple. No tracheal deviation present. No thyromegaly present.  No cervical lymphadenopathy Cardiovascular: Normal rate, regular rhythm and normal heart sounds.   No murmur heard. No carotid bruit .  No edema Pulmonary/Chest: Effort normal and breath sounds normal. No respiratory distress. No has no wheezes. No rales.  Skin: Skin is warm and dry. Not diaphoretic.  Psychiatric: Normal mood and affect. Behavior is normal.      Assessment & Plan:    See Problem List for Assessment and Plan of chronic medical problems.    This visit occurred during the SARS-CoV-2 public health emergency.  Safety protocols were in place, including screening questions prior to the visit, additional usage of staff PPE, and extensive cleaning of exam room while observing appropriate contact time as indicated for disinfecting solutions.    This encounter was created in error - please disregard.

## 2020-01-16 NOTE — Patient Instructions (Signed)
  Blood work was ordered.     Medications reviewed and updated.  Changes include :     Your prescription(s) have been submitted to your pharmacy. Please take as directed and contact our office if you believe you are having problem(s) with the medication(s).  A referral was ordered for        Someone will call you to schedule this.    Please followup in 6 months   

## 2020-01-17 ENCOUNTER — Encounter: Payer: Medicare Other | Admitting: Internal Medicine

## 2020-01-17 ENCOUNTER — Other Ambulatory Visit: Payer: Self-pay

## 2020-01-17 ENCOUNTER — Ambulatory Visit: Payer: Medicare Other | Admitting: Orthopedic Surgery

## 2020-01-17 ENCOUNTER — Encounter: Payer: Self-pay | Admitting: Internal Medicine

## 2020-01-17 DIAGNOSIS — Z79891 Long term (current) use of opiate analgesic: Secondary | ICD-10-CM | POA: Diagnosis not present

## 2020-01-17 DIAGNOSIS — G8929 Other chronic pain: Secondary | ICD-10-CM | POA: Diagnosis not present

## 2020-01-17 DIAGNOSIS — G894 Chronic pain syndrome: Secondary | ICD-10-CM | POA: Diagnosis not present

## 2020-01-17 DIAGNOSIS — M545 Low back pain: Secondary | ICD-10-CM | POA: Diagnosis not present

## 2020-01-17 DIAGNOSIS — M25511 Pain in right shoulder: Secondary | ICD-10-CM | POA: Diagnosis not present

## 2020-01-18 ENCOUNTER — Telehealth: Payer: Medicare Other | Admitting: Internal Medicine

## 2020-01-20 NOTE — Progress Notes (Signed)
Subjective:    Patient ID: Jill Phillips, female    DOB: July 23, 1956, 64 y.o.   MRN: [id number]  HPI The patient is here for follow up of their chronic medical problems, including diabetes, hypertension, hyperlipidemia, hypothyroidism, gerd, depression, RLS, anxiety, insomnia, chronic pain.  She is taking all of her medications as prescribed.   She is not exercising regularly.    She is still smoking.    She is still having bladder pain.  She did see urology - they prescribed a medication and it has not helped.     Medications and allergies reviewed with patient and updated if appropriate.  Patient Active Problem List   Diagnosis Date Noted  . Acute cystitis without hematuria 11/08/2019  . Status post shoulder surgery 09/09/2019  . Nausea 08/19/2019  . Anxiety 05/04/2019  . Insomnia 05/04/2019  . Headache 12/08/2018  . Syncope 12/08/2018  . COPD with exacerbation ([place]) 11/26/2018  . Disorder of rotator cuff syndrome of left shoulder and allied disorder 06/22/2018  . Leg edema, left 06/04/2018  . Type 2 diabetes mellitus without complication, without long-term current use of insulin ([place]) 12/02/2017  . Sacral pain 09/17/2017  . COPD (chronic obstructive pulmonary disease) ([place]) 04/21/2017  . Lower back pain 03/03/2017  . CKD (chronic kidney disease) stage 3, GFR 30-59 ml/min 08/07/2016  . GERD (gastroesophageal reflux disease) 08/07/2016  . Chronic respiratory failure ([place]) 07/29/2016  . Arthritis of carpometacarpal [facility]) joint of left thumb 07/09/2016  . Pneumothorax on left 01/31/2016  . Chronic, continuous use of opioids 09/06/2015  . Degenerative disc disease, lumbar 08/01/2015  . Spondylolisthesis of lumbar region 08/01/2015  . Urinary, incontinence, stress female 05/06/2014  . Constipation 05/05/2014  . 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 HTN (hypertension) 05/19/2013  . HLD (hyperlipidemia) 05/19/2013  . Depression 05/19/2013  . Hypothyroidism 05/19/2013  . Tobacco  abuse   . Osteoarthritis of both knees 11/18/2011  . RLS (restless legs syndrome) 11/18/2011    Current Outpatient Medications on File Prior to Visit  Medication Sig Dispense Refill  . acetaminophen (TYLENOL) 500 MG tablet Take 1,000 mg by mouth every 6 (six) hours as needed (for headaches.).    [name] albuterol (PROAIR HFA) 108 (90 Base) MCG/ACT inhaler INHALE 1 PUFF INTO THE LUNGS EVERY 6 HOURS AS NEEDED FOR WHEEZING OR SHORTNESS OF BREATH.NEED OFFICE VISIT FOR FURTHER REFILLS (Patient taking differently: Inhale 1 puff into the lungs every 6 (six) hours as needed for wheezing or shortness of breath. ) 8.5 g 0  . amLODipine-benazepril (LOTREL) 10-40 MG capsule Take 1 capsule by mouth daily. 90 capsule 1  . aspirin EC 81 MG tablet Take 1 tablet (81 mg total) by mouth 2 (two) times daily. 60 tablet 0  . atorvastatin (LIPITOR) 20 MG tablet TAKE 1 TABLET BY MOUTH DAILY. 90 tablet 1  . benzoyl peroxide (BENZOYL PEROXIDE) 5 % external liquid Wash affected shoulder daily with wash for 3 days prior to surgery 142 g 0  . cimetidine (TAGAMET) 400 MG tablet Take 400 mg by mouth 2 (two) times daily.    [name] docusate sodium (COLACE) 100 MG capsule Take 200 mg by mouth at bedtime.    . fentaNYL (DURAGESIC) 50 MCG/HR PLACE 1 PATCH ONTO THE SKIN EVERY 3 DAYS. 10 patch 0  . gabapentin (NEURONTIN) 400 MG capsule TAKE 1 CAPSULE BY MOUTH 3 TIMES DAILY. 270 capsule 1  . HYDROcodone-acetaminophen (NORCO) 10-325 MG tablet Take 1-2 tablets by mouth every 6 (six) hours as needed  for severe pain. 40 tablet 0  . levothyroxine (SYNTHROID) 88 MCG tablet TAKE 1 TABLET BY MOUTH DAILY. 90 tablet 1  . NARCAN 4 MG/0.1ML LIQD nasal spray kit Place 1 spray into the nose as needed (accidental overdose).     . QUEtiapine (SEROQUEL) 100 MG tablet Take 1 tablet (100 mg total) by mouth at bedtime. Need office visit for more refills. 30 tablet 20  . Sennosides (SENNA LAX PO) Take 2 tablets by mouth at bedtime.     [name] tiotropium (SPIRIVA) 18  MCG inhalation capsule Place 1 capsule (18 mcg total) into inhaler and inhale daily. 30 capsule 2  . valACYclovir (VALTREX) 500 MG tablet TAKE 1 TABLET BY MOUTH DAILY. 90 tablet 0  . venlafaxine XR (EFFEXOR-XR) 150 MG 24 hr capsule TAKE 2 CAPSULES BY MOUTH DAILY. 180 capsule 0  . Methen-Hyosc-Meth Blue-Na Phos (ME/NAPHOS/MB/HYO1) 81.6 MG TABS Take 1 tablet by mouth 3 (three) times daily.     No current facility-administered medications on file prior to visit.    Past Medical History:  Diagnosis Date  . Active smoker   . Anxiety   . Calcifying tendinitis of shoulder   . Chronic back pain   . Chronic pain syndrome   . COPD (chronic obstructive pulmonary disease) ([place])   . Dysthymic disorder   . Emphysema lung ([place])   . GERD (gastroesophageal reflux disease)   . Headache    "weekly maybe" (01/31/2016)  . Heart murmur    for years, nothing to be concerned about  . Herpes genitalia   . History of blood transfusion 1980   related to "back surgery"  . Hyperlipidemia   . Hypertension   . Hypothyroidism   . Lumbago   . Osteoarthrosis, unspecified whether generalized or localized, lower leg   . Pain in joint, upper arm   . Pneumothorax, left 01/31/2016   S/P Left posterior subcostal pain injection on 01/30/2016  . PONV (postoperative nausea and vomiting)    gets nauseous  with longer surgery. Difficuty voiding after surgery  . Postlaminectomy syndrome, thoracic region   . Primary localized osteoarthrosis, lower leg   . Restless leg syndrome   . Sleep apnea    s/p surgery- last sleep study 2011- doesnt use oxygen or machine at night as instructed,.   12/2014- Dr Halford Chessman  reports it is negative.  . Thyroid disease     Past Surgical History:  Procedure Laterality Date  . APPENDECTOMY    . BACK SURGERY     18 back surgeries (2 thoracic & 16 lumbar) (01/31/2016)  . DILATION AND CURETTAGE OF UTERUS    . HAMMER TOE SURGERY    . IR RADIOLOGIST EVAL & MGMT  07/21/2017  . JOINT REPLACEMENT      . KNEE ARTHROSCOPY Right   . LAPAROSCOPIC CHOLECYSTECTOMY    . LUMBAR FUSION N/A 08/01/2015   Procedure: Right sided L1-2 and L2-3 transforaminal lumbar interbody fusion with cages, Extension of posterior fusion T12 to L3, Replaced pedicle screws bilaterally L1-L2 , Replaced left sided pedicle screws L-3. Instrumentation T12 to L3 using local bone graft, Vivigen allograft and cancellous chips;  Surgeon: [name], MD;  Location: [place];  Service: Orthopedics;  Laterality: N/A;  . LUMBAR LAMINECTOMY/DECOMPRESSION MICRODISCECTOMY N/A 01/28/2014   Procedure: Minimally Invasive Right  L1-2 Microdiscectomy;  Surgeon: [name], MD;  Location: [place];  Service: Orthopedics;  Laterality: N/A;  . REVERSE SHOULDER ARTHROPLASTY Left 09/09/2019   Procedure: LEFT REVERSE SHOULDER REPLACEMENT;  Surgeon: Meredith Pel, MD;  Location: [place];  Service: Orthopedics;  Laterality: Left;  . TOTAL HIP ARTHROPLASTY Right   . TOTAL KNEE ARTHROPLASTY  05/29/2012   Procedure: TOTAL KNEE ARTHROPLASTY;  Surgeon: Mcarthur Rossetti, MD;  [facility];  Service: Orthopedics;  Laterality: Right;  Right Total Knee Arthroplasty  . TUBAL LIGATION    . UVULOPALATOPHARYNGOPLASTY    . VAGINAL HYSTERECTOMY      Social History   Socioeconomic History  . Marital status: Divorced    Spouse name: n/a  . Number of children: 2  . Years of education: 12+  . Highest education level: Not on file  Occupational History  . Occupation: disability    Comment: back surgeries  Tobacco Use  . Smoking status: Current Every Day Smoker    Packs/day: 1.25    Years: 45.00    Pack years: 56.25    Types: Cigarettes  . Smokeless tobacco: Never Used  . Tobacco comment: form given 12/25/16  Substance and Sexual Activity  . Alcohol use: No    Alcohol/week: 0.0 standard drinks  . Drug use: No    Types: Marijuana    Comment: 01/31/2016 "none since ~ 1980"  . Sexual activity: Never    Partners: Male  Other Topics Concern  .  Not on file  Social History Narrative   Lives alone.  One daughter is local, but is getting ready to move to [place], where her children live with their father.  The other daughter lives near [place], [place].   Social Determinants of Health   Financial Resource Strain:   . Difficulty of Paying Living Expenses:   Food Insecurity:   . Worried About Charity fundraiser in the Last Year:   . Arboriculturist in the Last Year:   Transportation Needs:   . Film/video editor (Medical):   [name] Lack of Transportation (Non-Medical):   Physical Activity: Inactive  . Days of Exercise per Week: 0 days  . Minutes of Exercise per Session: 0 min  Stress:   . Feeling of Stress :   Social Connections:   . Frequency of Communication with Friends and Family:   . Frequency of Social Gatherings with Friends and Family:   . Attends Religious Services:   . Active Member of Clubs or Organizations:   . Attends Archivist Meetings:   [name] Marital Status:     Family History  Problem Relation Age of Onset  . Kidney disease Mother   . Heart disease Father   . Anuerysm Brother 29       brain  . Heart disease Brother   . Heart disease Sister 31       s/p CABG  . Hypertension Sister   . Colon cancer Neg Hx     Review of Systems  Constitutional: Negative for fever.  Respiratory: Positive for shortness of breath. Negative for cough and wheezing.   Cardiovascular: Negative for chest pain, palpitations and leg swelling.  Gastrointestinal: Positive for abdominal pain and nausea.  Genitourinary: Positive for pelvic pain (bladder pain). Negative for dysuria and hematuria.  Neurological: Positive for headaches (occ). Negative for light-headedness.       Objective:   Vitals:   01/21/20 1425  BP: (!) 152/88  Pulse: (!) 104  Resp: 16  Temp: 98.6 F (37 C)  SpO2: 95%   BP Readings from Last 3 Encounters:  01/21/20 (!) 152/88  12/30/19 139/86  11/16/19 114/70   Wt  Readings from Last 3  Encounters:  01/21/20 144 lb 9.6 oz (65.6 kg)  12/30/19 147 lb (66.7 kg)  11/16/19 147 lb (66.7 kg)   Body mass index is 24.82 kg/m.   Physical Exam    Constitutional: Appears well-developed and well-nourished. No distress.  HENT:  Head: Normocephalic and atraumatic.  Neck: Neck supple. No tracheal deviation present. No thyromegaly present.  No cervical lymphadenopathy Cardiovascular: Normal rate, regular rhythm and normal heart sounds.   No murmur heard. No carotid bruit .  No edema Pulmonary/Chest: Effort normal and breath sounds normal. No respiratory distress. No has no wheezes. No rales.  Skin: Skin is warm and dry. Not diaphoretic.  Psychiatric: Normal mood and affect. Behavior is normal.   Diabetic Foot Exam - Simple   Simple Foot Form Diabetic Foot exam was performed with the following findings: Yes 01/21/2020  2:59 PM  Visual Inspection No deformities, no ulcerations, no other skin breakdown bilaterally: Yes Sensation Testing See comments: Yes Pulse Check Posterior Tibialis and Dorsalis pulse intact bilaterally: Yes Comments L foot - sensation intact R foot - decreased sensation to light touch - secondary to prior back surgery - chronic       Assessment & Plan:    See Problem List for Assessment and Plan of chronic medical problems.    This visit occurred during the SARS-CoV-2 public health emergency.  Safety protocols were in place, including screening questions prior to the visit, additional usage of staff PPE, and extensive cleaning of exam room while observing appropriate contact time as indicated for disinfecting solutions.

## 2020-01-21 ENCOUNTER — Other Ambulatory Visit: Payer: Self-pay | Admitting: Internal Medicine

## 2020-01-21 ENCOUNTER — Encounter: Payer: Self-pay | Admitting: Internal Medicine

## 2020-01-21 ENCOUNTER — Ambulatory Visit (INDEPENDENT_AMBULATORY_CARE_PROVIDER_SITE_OTHER): Payer: Medicare Other | Admitting: Internal Medicine

## 2020-01-21 ENCOUNTER — Other Ambulatory Visit: Payer: Self-pay

## 2020-01-21 VITALS — BP 152/88 | HR 104 | Temp 98.6°F | Resp 16 | Ht 64.0 in | Wt 144.6 lb

## 2020-01-21 DIAGNOSIS — K5903 Drug induced constipation: Secondary | ICD-10-CM

## 2020-01-21 DIAGNOSIS — B009 Herpesviral infection, unspecified: Secondary | ICD-10-CM

## 2020-01-21 DIAGNOSIS — E119 Type 2 diabetes mellitus without complications: Secondary | ICD-10-CM | POA: Diagnosis not present

## 2020-01-21 DIAGNOSIS — F419 Anxiety disorder, unspecified: Secondary | ICD-10-CM

## 2020-01-21 DIAGNOSIS — E7849 Other hyperlipidemia: Secondary | ICD-10-CM

## 2020-01-21 DIAGNOSIS — N1831 Chronic kidney disease, stage 3a: Secondary | ICD-10-CM

## 2020-01-21 DIAGNOSIS — K219 Gastro-esophageal reflux disease without esophagitis: Secondary | ICD-10-CM | POA: Diagnosis not present

## 2020-01-21 DIAGNOSIS — I10 Essential (primary) hypertension: Secondary | ICD-10-CM

## 2020-01-21 DIAGNOSIS — E038 Other specified hypothyroidism: Secondary | ICD-10-CM

## 2020-01-21 DIAGNOSIS — G4709 Other insomnia: Secondary | ICD-10-CM

## 2020-01-21 DIAGNOSIS — J439 Emphysema, unspecified: Secondary | ICD-10-CM

## 2020-01-21 DIAGNOSIS — F3289 Other specified depressive episodes: Secondary | ICD-10-CM

## 2020-01-21 DIAGNOSIS — N3010 Interstitial cystitis (chronic) without hematuria: Secondary | ICD-10-CM

## 2020-01-21 LAB — CBC WITH DIFFERENTIAL/PLATELET
Basophils Absolute: 0.1 10*3/uL (ref 0.0–0.1)
Basophils Relative: 0.8 % (ref 0.0–3.0)
Eosinophils Absolute: 0.1 10*3/uL (ref 0.0–0.7)
Eosinophils Relative: 1 % (ref 0.0–5.0)
HCT: 42.1 % (ref 36.0–46.0)
Hemoglobin: 13.8 g/dL (ref 12.0–15.0)
Lymphocytes Relative: 23.4 % (ref 12.0–46.0)
Lymphs Abs: 1.8 10*3/uL (ref 0.7–4.0)
MCHC: 32.8 g/dL (ref 30.0–36.0)
MCV: 91.8 fl (ref 78.0–100.0)
Monocytes Absolute: 0.4 10*3/uL (ref 0.1–1.0)
Monocytes Relative: 5.6 % (ref 3.0–12.0)
Neutro Abs: 5.3 10*3/uL (ref 1.4–7.7)
Neutrophils Relative %: 69.2 % (ref 43.0–77.0)
Platelets: 291 10*3/uL (ref 150.0–400.0)
RBC: 4.58 Mil/uL (ref 3.87–5.11)
RDW: 16.1 % — ABNORMAL HIGH (ref 11.5–15.5)
WBC: 7.7 10*3/uL (ref 4.0–10.5)

## 2020-01-21 LAB — LIPID PANEL
Cholesterol: 209 mg/dL — ABNORMAL HIGH (ref 0–200)
HDL: 44 mg/dL (ref >39.00)
NonHDL: 164.75
Total CHOL/HDL Ratio: 5
Triglycerides: 219 mg/dL — ABNORMAL HIGH (ref 0.0–149.0)
VLDL: 43.8 mg/dL — ABNORMAL HIGH (ref 0.0–40.0)

## 2020-01-21 LAB — COMPREHENSIVE METABOLIC PANEL
ALT: 10 U/L (ref 0–35)
AST: 17 U/L (ref 0–37)
Albumin: 4.6 g/dL (ref 3.5–5.2)
Alkaline Phosphatase: 162 U/L — ABNORMAL HIGH (ref 39–117)
BUN: 13 mg/dL (ref 6–23)
CO2: 28 mEq/L (ref 19–32)
Calcium: 9.9 mg/dL (ref 8.4–10.5)
Chloride: 104 mEq/L (ref 96–112)
Creatinine, Ser: 1.1 mg/dL (ref 0.40–1.20)
GFR: 49.86 mL/min — ABNORMAL LOW (ref >60.00)
Glucose, Bld: 97 mg/dL (ref 70–99)
Potassium: 4 mEq/L (ref 3.5–5.1)
Sodium: 140 mEq/L (ref 135–145)
Total Bilirubin: 0.4 mg/dL (ref 0.2–1.2)
Total Protein: 7.9 g/dL (ref 6.0–8.3)

## 2020-01-21 LAB — TSH: TSH: 1.04 u[IU]/mL (ref 0.35–4.50)

## 2020-01-21 LAB — HEMOGLOBIN A1C: Hgb A1c MFr Bld: 6.2 % (ref 4.6–6.5)

## 2020-01-21 LAB — LDL CHOLESTEROL, DIRECT: Direct LDL: 124 mg/dL

## 2020-01-21 MED ORDER — CLONAZEPAM 1 MG PO TABS: ORAL_TABLET | ORAL | 0 refills | Status: DC

## 2020-01-21 MED ORDER — PANTOPRAZOLE SODIUM 40 MG PO TBEC: 40.0000 mg | DELAYED_RELEASE_TABLET | Freq: Every day | ORAL | 1 refills | Status: DC

## 2020-01-21 MED ORDER — PROMETHAZINE HCL 25 MG PO TABS: 25.0000 mg | ORAL_TABLET | Freq: Three times a day (TID) | ORAL | 0 refills | Status: DC | PRN

## 2020-01-21 NOTE — Assessment & Plan Note (Signed)
Chronic Still smoking - encouraged smoking cessation Using spiriva which helps SOB controlled continue

## 2020-01-21 NOTE — Assessment & Plan Note (Signed)
Symptoms c/w IC Has seen urology  Medication prescribed has not helped - advised following up with uro

## 2020-01-21 NOTE — Assessment & Plan Note (Signed)
Chronic Partially related to opiods Taking stool softner, senna Advised adding miralax

## 2020-01-21 NOTE — Assessment & Plan Note (Addendum)
Chronic GERD increased - uncontrolled Taking omeprazole twice daily - change to protonix 2/day If not improved should see GI - due for colonoscopy - advised to schedule

## 2020-01-21 NOTE — Patient Instructions (Addendum)
  Blood work was ordered.     Medications reviewed and updated.  Changes include :    Stop omeprazole.  Start pantoprazole twice daily.    Your prescription(s) have been submitted to your pharmacy. Please take as directed and contact our office if you believe you are having problem(s) with the medication(s).    Please followup in 6 months

## 2020-01-21 NOTE — Assessment & Plan Note (Signed)
Chronic Takes valtrex daily Controlled continue

## 2020-01-21 NOTE — Assessment & Plan Note (Signed)
Chronic Check lipid panel  Continue daily statin Regular exercise and healthy diet encouraged  

## 2020-01-21 NOTE — Assessment & Plan Note (Signed)
BP Readings from Last 3 Encounters:  01/21/20 (!) 152/88  12/30/19 139/86  11/16/19 114/70   Chronic BP controlled Current regimen effective and well tolerated Continue current medications at current doses cmp

## 2020-01-21 NOTE — Assessment & Plan Note (Signed)
Chronic cmp 

## 2020-01-21 NOTE — Assessment & Plan Note (Signed)
Chronic  a1c  Diet controlled Encouraged regular exercise, diabetic diet

## 2020-01-21 NOTE — Assessment & Plan Note (Signed)
Chronic  Clinically euthyroid Check tsh  Titrate med dose if needed  

## 2020-01-21 NOTE — Assessment & Plan Note (Signed)
Chronic Controlled, stable Continue current dose of medication effexor  300 mg daily

## 2020-01-21 NOTE — Assessment & Plan Note (Signed)
Chronic Controlled, stable Continue current dose of medication seroquel 100 mg HS

## 2020-01-23 ENCOUNTER — Encounter: Payer: Self-pay | Admitting: Internal Medicine

## 2020-01-24 NOTE — Telephone Encounter (Signed)
Closing encounter

## 2020-01-24 NOTE — Telephone Encounter (Signed)
F/u   The patient calls have questions regarding Dr. Quay Burow message on decrease Kidney Function asking for a call back from the [facility].

## 2020-01-27 NOTE — Telephone Encounter (Signed)
Spoke with pt in regards  

## 2020-02-01 ENCOUNTER — Other Ambulatory Visit: Payer: Medicare Other

## 2020-02-12 ENCOUNTER — Other Ambulatory Visit: Payer: Self-pay | Admitting: Internal Medicine

## 2020-02-16 DIAGNOSIS — R5382 Chronic fatigue, unspecified: Secondary | ICD-10-CM | POA: Diagnosis not present

## 2020-02-16 DIAGNOSIS — J439 Emphysema, unspecified: Secondary | ICD-10-CM | POA: Diagnosis not present

## 2020-02-16 DIAGNOSIS — G4730 Sleep apnea, unspecified: Secondary | ICD-10-CM | POA: Diagnosis not present

## 2020-02-16 DIAGNOSIS — M47897 Other spondylosis, lumbosacral region: Secondary | ICD-10-CM | POA: Diagnosis not present

## 2020-02-16 DIAGNOSIS — G894 Chronic pain syndrome: Secondary | ICD-10-CM | POA: Diagnosis not present

## 2020-02-16 DIAGNOSIS — E1142 Type 2 diabetes mellitus with diabetic polyneuropathy: Secondary | ICD-10-CM | POA: Diagnosis not present

## 2020-02-17 ENCOUNTER — Ambulatory Visit: Payer: Medicare Other | Admitting: Specialist

## 2020-02-17 ENCOUNTER — Telehealth: Payer: Self-pay

## 2020-02-17 NOTE — Telephone Encounter (Signed)
New message    The patient calls voiced Dr. Mirna Mires can not write any prescription.   The patient was referral over to Dr. Gerald Stabs who advise the patient to ask for a 30 day supply from her PCP to see if she would refill medication.   fentaNYL (DURAGESIC) 50 MCG/HR  HYDROcodone-acetaminophen (NORCO) 10-325 MG tablet  [facility] - [place], [place]

## 2020-02-17 NOTE — Telephone Encounter (Signed)
Very sorry, but I do not practice chronic pain medicine, and it would appear the hydrocodone is likely early at this time;  I think can wait for PCP but I would not feel comfortable with this, thanks

## 2020-02-22 MED ORDER — FENTANYL 50 MCG/HR TD PT72: MEDICATED_PATCH | TRANSDERMAL | 0 refills | Status: DC

## 2020-02-22 MED ORDER — HYDROCODONE-ACETAMINOPHEN 10-325 MG PO TABS: 1.0000 | ORAL_TABLET | Freq: Four times a day (QID) | ORAL | 0 refills | Status: DC | PRN

## 2020-02-22 NOTE — Telephone Encounter (Signed)
Please confirm she has an appt with a new pain management doctor - when is it.   I can provide one month only of pain medications after that she will need to get them from pain management.  It looks like she is due later this week - please send this back and I can send in the medications.

## 2020-02-22 NOTE — Telephone Encounter (Signed)
Pain medication sent to pharmacy-just enough until she sees pain management.  Advised pharmacy not to fill early.

## 2020-02-22 NOTE — Telephone Encounter (Signed)
   Patient calling to report she will be seeing Dr Mohammed Kindle on 07/14

## 2020-02-23 ENCOUNTER — Telehealth: Payer: Self-pay | Admitting: Internal Medicine

## 2020-02-23 DIAGNOSIS — M4316 Spondylolisthesis, lumbar region: Secondary | ICD-10-CM

## 2020-02-23 NOTE — Telephone Encounter (Signed)
New message:   Pt is calling and states Dr. Primus Bravo from Pain [facility] decided that he didn't want to take this pt on. She states he has referred her to [facility] in [place] but she states they are unable to get her in there before 03/15/20 and she already has an appt that day to review her MRI results.

## 2020-02-24 ENCOUNTER — Other Ambulatory Visit: Payer: Self-pay | Admitting: Internal Medicine

## 2020-02-28 NOTE — Telephone Encounter (Signed)
F/u    The patient is asking can pain referral be sent to [facility]   Fax # [phone]

## 2020-02-29 ENCOUNTER — Other Ambulatory Visit: Payer: Self-pay | Admitting: Internal Medicine

## 2020-02-29 DIAGNOSIS — N3010 Interstitial cystitis (chronic) without hematuria: Secondary | ICD-10-CM | POA: Diagnosis not present

## 2020-03-01 ENCOUNTER — Ambulatory Visit
Admission: RE | Admit: 2020-03-01 | Discharge: 2020-03-01 | Disposition: A | Discharge status: Home / Self Care | Payer: Medicare Other | Source: Ambulatory Visit | Attending: Specialist | Admitting: Specialist

## 2020-03-01 DIAGNOSIS — M5134 Other intervertebral disc degeneration, thoracic region: Secondary | ICD-10-CM

## 2020-03-01 DIAGNOSIS — M4326 Fusion of spine, lumbar region: Secondary | ICD-10-CM | POA: Diagnosis not present

## 2020-03-01 DIAGNOSIS — M4325 Fusion of spine, thoracolumbar region: Secondary | ICD-10-CM

## 2020-03-01 DIAGNOSIS — M4804 Spinal stenosis, thoracic region: Secondary | ICD-10-CM | POA: Diagnosis not present

## 2020-03-01 DIAGNOSIS — M960 Pseudarthrosis after fusion or arthrodesis: Secondary | ICD-10-CM

## 2020-03-01 DIAGNOSIS — M5124 Other intervertebral disc displacement, thoracic region: Secondary | ICD-10-CM | POA: Diagnosis not present

## 2020-03-01 DIAGNOSIS — M5414 Radiculopathy, thoracic region: Secondary | ICD-10-CM

## 2020-03-01 DIAGNOSIS — Z981 Arthrodesis status: Secondary | ICD-10-CM | POA: Diagnosis not present

## 2020-03-01 DIAGNOSIS — M47814 Spondylosis without myelopathy or radiculopathy, thoracic region: Secondary | ICD-10-CM | POA: Diagnosis not present

## 2020-03-01 DIAGNOSIS — M2578 Osteophyte, vertebrae: Secondary | ICD-10-CM | POA: Diagnosis not present

## 2020-03-01 DIAGNOSIS — M545 Low back pain, unspecified: Secondary | ICD-10-CM

## 2020-03-01 NOTE — Telephone Encounter (Signed)
Has a referral been sent to [facility] in the past? I ordered it ...Marland KitchenMarland KitchenMarland Kitchen

## 2020-03-04 ENCOUNTER — Other Ambulatory Visit: Payer: Self-pay | Admitting: Specialist

## 2020-03-14 ENCOUNTER — Telehealth: Payer: Self-pay | Admitting: Internal Medicine

## 2020-03-14 NOTE — Telephone Encounter (Signed)
New message:   1.Medication Requested: HYDROcodone-acetaminophen (NORCO) 10-325 MG tablet fentaNYL (DURAGESIC) 50 MCG/HR 2. Pharmacy (Name, Street, [place]): [facility] [place], [place] 3. On Med List: yes  4. Last Visit with PCP:   5. Next visit date with PCP:  Pt states she has still been unable to get scheduled for the pain clinic that she wishes to go to and would like to know if Dr. Quay Burow can just right these prescriptions for a month. She states the Dr has been on vacation. Please advise.   Agent: Please be advised that RX refills may take up to 3 business days. We ask that you follow-up with your pharmacy.

## 2020-03-15 ENCOUNTER — Ambulatory Visit (INDEPENDENT_AMBULATORY_CARE_PROVIDER_SITE_OTHER): Payer: Medicare Other | Admitting: Specialist

## 2020-03-15 ENCOUNTER — Encounter: Payer: Self-pay | Admitting: Specialist

## 2020-03-15 ENCOUNTER — Other Ambulatory Visit: Payer: Self-pay

## 2020-03-15 VITALS — BP 106/68 | HR 107 | Ht 64.0 in | Wt 145.0 lb

## 2020-03-15 DIAGNOSIS — M545 Low back pain, unspecified: Secondary | ICD-10-CM

## 2020-03-15 DIAGNOSIS — M1712 Unilateral primary osteoarthritis, left knee: Secondary | ICD-10-CM | POA: Diagnosis not present

## 2020-03-15 DIAGNOSIS — M180 Bilateral primary osteoarthritis of first carpometacarpal joints: Secondary | ICD-10-CM | POA: Diagnosis not present

## 2020-03-15 DIAGNOSIS — M25562 Pain in left knee: Secondary | ICD-10-CM

## 2020-03-15 DIAGNOSIS — M19012 Primary osteoarthritis, left shoulder: Secondary | ICD-10-CM | POA: Diagnosis not present

## 2020-03-15 DIAGNOSIS — G8929 Other chronic pain: Secondary | ICD-10-CM | POA: Diagnosis not present

## 2020-03-15 DIAGNOSIS — M5134 Other intervertebral disc degeneration, thoracic region: Secondary | ICD-10-CM

## 2020-03-15 MED ORDER — FENTANYL 50 MCG/HR TD PT72: MEDICATED_PATCH | TRANSDERMAL | 0 refills | Status: DC

## 2020-03-15 MED ORDER — NARCAN 4 MG/0.1ML NA LIQD: 1.0000 | NASAL | 0 refills | Status: AC | PRN

## 2020-03-15 MED ORDER — HYDROCODONE-ACETAMINOPHEN 10-325 MG PO TABS: 1.0000 | ORAL_TABLET | Freq: Four times a day (QID) | ORAL | 0 refills | Status: DC | PRN

## 2020-03-15 NOTE — Progress Notes (Addendum)
Office Visit Note   Patient: Jill Phillips           Date of Birth: [birth year]           MRN: [id number] Visit Date: 03/15/2020              Requested by: [name], MD [place],  [place] [address] PCP: [name], MD   Assessment & Plan: Visit Diagnoses:  1. Osteoarthritis of carpometacarpal (CMC) joints of both thumbs, unspecified osteoarthritis type   2. Degenerative disc disease, thoracic   3. Low back pain, unspecified back pain laterality, unspecified chronicity, unspecified whether sciatica present   4. Primary osteoarthritis, left shoulder   5. Arthritis of left knee     Plan: Avoid bending, stooping and avoid lifting weights greater than 10 lbs. Avoid prolong standing and walking. Avoid frequent bending and stooping  No lifting greater than 10 lbs. May use ice or moist heat for pain. Weight loss is of benefit. Handicap license is approved. Will prescribe one month supply of your medication for pain control and no more. In the interval you should contact [facility]  Or [facility] to enquire about a methadone maintenance program should The attempts at getting you into pain management prove to be unsuccessful. Knee is suffering from osteoarthritis, only real proven treatments are Weight loss, NSIADs like diclofenac and exercise. Well padded shoes help. Ice the knee that is suffering from osteoarthritis, only real proven treatments are  Well padded shoes help. Ice the knee 2-3 times a day 15-20 mins at a time.-3 times a day 15-20 mins at a time. Hot showers in the AM.  Injection with steroid may be of benefit. Hemp CBD capsules, amazon.com 5,000-7,000 mg per bottle, 60 capsules per bottle, take one capsule twice a day. Cane in the left hand to use with left leg weight bearing. Follow-Up Instructions: No follow-ups on file.   Orders:  No orders of the defined types were placed in this encounter.  No orders of the defined types were  placed in this encounter.     Procedures: XR KNEE 3 VIEW LEFT  Date/Time: 03/15/2020 11:51 AM Performed by: [name], MD Authorized by: Lanae Crumbly, PA-C       Clinical Data: No additional findings.   Subjective: Chief Complaint  Patient presents with  . Lower Back - Follow-up    MRI Review  . Middle Back - Follow-up    MRI Review    64 year old female with history of thoracolumbar fusion from T11 to S1 last surgery 07/2015. She has had pain in the sacrum and into the Pelvis with pelvic pain. Now with pain management and has been taking some form of narcotic medication for over 10 years. She was in A pain management program with Dr. Trula Ore but unfortunately he has not been able to keep in the pain management field and referred her To Dr. Josefa Half. Dr. Josefa Half refused to renew her medications for about 4-6 weeks and she has spoke to her primary care MD about referral  Back to Dr. Etter Sjogren at [facility] for further pain management. She reports that the referral has been made but that her  Primary care medical physician will not renew her meds. She had decrease in kidney function according to Dr. Celso Amy. [name] underwent recent MRIs of the thoracic and lumbar spine. She is having surgery on her bladder to dialate her bladder. She has pelvic  pain And the meds are high for treating her pelvic pain, the bladder procedure is scheduled for August 11. She does not have an appointment with Dr. Etter Sjogren and requests that we renew her narcotics as her primary care MD will not.    Review of Systems  Constitutional: Positive for activity change. Negative for appetite change, chills, diaphoresis, fatigue, fever and unexpected weight change (she has lost weight 154 down to 142 lbs.).  HENT: Negative.  Negative for congestion, dental problem, drooling, ear discharge, ear pain, facial swelling, hearing loss, mouth sores, nosebleeds, postnasal drip and rhinorrhea.   Eyes:  Negative.  Negative for photophobia, pain, discharge, redness, itching and visual disturbance.  Respiratory: Positive for cough, shortness of breath and wheezing. Negative for apnea, choking, chest tightness and stridor.   Cardiovascular: Negative.  Negative for chest pain, palpitations and leg swelling.  Gastrointestinal: Negative.  Negative for abdominal distention, abdominal pain, anal bleeding, blood in stool, constipation, diarrhea, nausea, rectal pain and vomiting.  Endocrine: Negative.  Negative for cold intolerance, heat intolerance, polydipsia, polyphagia and polyuria.  Genitourinary: Positive for dysuria, pelvic pain and urgency.  Musculoskeletal: Positive for arthralgias and back pain. Negative for gait problem, joint swelling, myalgias, neck pain and neck stiffness.  Skin: Negative.  Negative for color change, pallor, rash and wound.  Allergic/Immunologic: Negative.  Negative for environmental allergies, food allergies and immunocompromised state.  Neurological: Positive for weakness and numbness. Negative for dizziness, tremors, seizures, syncope, facial asymmetry, speech difficulty, light-headedness and headaches.  Hematological: Negative.  Negative for adenopathy. Does not bruise/bleed easily.  Psychiatric/Behavioral: Negative.  Negative for agitation, behavioral problems, confusion, decreased concentration, dysphoric mood, hallucinations, self-injury, sleep disturbance and suicidal ideas. The patient is not nervous/anxious and is not hyperactive.      Objective: Vital Signs: BP 106/68 (BP Location: Left Arm, Patient Position: Sitting)   Pulse (!) 107   Ht 5\' 4"  (1.626 m)   Wt 145 lb (65.8 kg)   BMI 24.89 kg/m   Physical Exam Constitutional:      Appearance: She is well-developed.  HENT:     Head: Normocephalic and atraumatic.  Eyes:     Pupils: Pupils are equal, round, and reactive to light.  Pulmonary:     Effort: Pulmonary effort is normal.     Breath sounds: Normal  breath sounds.  Abdominal:     General: Bowel sounds are normal.     Palpations: Abdomen is soft.  Musculoskeletal:     Cervical back: Normal range of motion and neck supple.     Lumbar back: Negative right straight leg raise test and negative left straight leg raise test.  Skin:    General: Skin is warm and dry.  Neurological:     Mental Status: She is alert and oriented to person, place, and time.  Psychiatric:        Behavior: Behavior normal.        Thought Content: Thought content normal.        Judgment: Judgment normal.     Back Exam   Tenderness  The patient is experiencing tenderness in the lumbar.  Range of Motion  Extension: abnormal  Flexion: abnormal  Lateral bend right: abnormal  Lateral bend left: abnormal  Rotation right: abnormal  Rotation left: abnormal   Muscle Strength  Right Quadriceps:  5/5  Left Quadriceps:  5/5  Right Hamstrings:  5/5  Left Hamstrings:  5/5   Tests  Straight leg raise right: negative Straight leg raise left: negative  Reflexes  Patellar: 0/4 Achilles: 0/4 Babinski's sign: normal   Other  Heel walk: abnormal Sensation: normal Gait: antalgic  Erythema: no back redness Scars: present  Comments:  Left knee varus deformity with decreased extension -5 degrees and Flexion to 120 degrees.   Right Hand Exam   Tenderness  The patient is experiencing tenderness in the radial area and palmar area.  Range of Motion  Wrist  Extension: normal  Flexion: normal  Pronation: normal  Supination: normal  Hand  MP Thumb: abnormal   Muscle Strength  Wrist extension: 5/5  Wrist flexion: 5/5  Grip: 3/5   Other  Erythema: absent Scars: absent Sensation: normal Pulse: present  Comments:  Right basal joint thumb with prominence and radial subluxation. Thumb index W-S is maintained   Left Hand Exam   Muscle Strength  Wrist extension: 5/5  Wrist flexion: 5/5  Grip:  4/5   Tests  Phalen's Sign:  negative Finkelstein's test: negative  Other  Erythema: absent Scars: absent Sensation: normal Pulse: present  Comments:  Left basal joint thumb with prominence and radial subluxation. Thumb index W-S is maintained       Specialty Comments:  No specialty comments available.  Imaging: No results found.   PMFS History: Patient Active Problem List   Diagnosis Date Noted  . Degenerative disc disease, lumbar 08/01/2015    Priority: High    Class: Chronic  . Spondylolisthesis of lumbar region 08/01/2015    Priority: High    Class: Chronic  . Interstitial cystitis 01/21/2020  . Status post shoulder surgery 09/09/2019  . Nausea 08/19/2019  . Anxiety 05/04/2019  . Insomnia 05/04/2019  . Syncope 12/08/2018  . Disorder of rotator cuff syndrome of left shoulder and allied disorder 06/22/2018  . Leg edema, left 06/04/2018  . Type 2 diabetes mellitus without complication, without long-term current use of insulin ([place]) 12/02/2017  . Sacral pain 09/17/2017  . COPD (chronic obstructive pulmonary disease) ([place]) 04/21/2017  . Lower back pain 03/03/2017  . CKD (chronic kidney disease) stage 3, GFR 30-59 ml/min 08/07/2016  . GERD (gastroesophageal reflux disease) 08/07/2016  . Chronic respiratory failure ([place]) 07/29/2016  . Arthritis of carpometacarpal [facility]) joint of left thumb 07/09/2016  . Pneumothorax on left 01/31/2016  . Chronic, continuous use of opioids 09/06/2015  . Urinary, incontinence, stress female 05/06/2014  . Constipation 05/05/2014  . 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 HTN (hypertension) 05/19/2013  . HLD (hyperlipidemia) 05/19/2013  . Depression 05/19/2013  . Hypothyroidism 05/19/2013  . Tobacco abuse   . Osteoarthritis of both knees 11/18/2011  . RLS (restless legs syndrome) 11/18/2011   Past Medical History:  Diagnosis Date  . Active smoker   . Anxiety   . Calcifying tendinitis of shoulder   . Chronic back pain   . Chronic pain syndrome   . COPD (chronic  obstructive pulmonary disease) ([place])   . Dysthymic disorder   . Emphysema lung ([place])   . GERD (gastroesophageal reflux disease)   . Headache    "weekly maybe" (01/31/2016)  . Heart murmur    for years, nothing to be concerned about  . Herpes genitalia   . History of blood transfusion 1980   related to "back surgery"  . Hyperlipidemia   . Hypertension   . Hypothyroidism   . Lumbago   . Osteoarthrosis, unspecified whether generalized or localized, lower leg   . Pain in joint, upper arm   . Pneumothorax, left 01/31/2016   S/P Left posterior subcostal pain injection on  01/30/2016  . PONV (postoperative nausea and vomiting)    gets nauseous  with longer surgery. Difficuty voiding after surgery  . Postlaminectomy syndrome, thoracic region   . Primary localized osteoarthrosis, lower leg   . Restless leg syndrome   . Sleep apnea    s/p surgery- last sleep study 2011- doesnt use oxygen or machine at night as instructed,.   12/2014- Dr Halford Chessman  reports it is negative.  . Thyroid disease     Family History  Problem Relation Age of Onset  . Kidney disease Mother   . Heart disease Father   . Anuerysm Brother 29       brain  . Heart disease Brother   . Heart disease Sister 67       s/p CABG  . Hypertension Sister   . Colon cancer Neg Hx     Past Surgical History:  Procedure Laterality Date  . APPENDECTOMY    . BACK SURGERY     18 back surgeries (2 thoracic & 16 lumbar) (01/31/2016)  . DILATION AND CURETTAGE OF UTERUS    . HAMMER TOE SURGERY    . IR RADIOLOGIST EVAL & MGMT  07/21/2017  . JOINT REPLACEMENT    . KNEE ARTHROSCOPY Right   . LAPAROSCOPIC CHOLECYSTECTOMY    . LUMBAR FUSION N/A 08/01/2015   Procedure: Right sided L1-2 and L2-3 transforaminal lumbar interbody fusion with cages, Extension of posterior fusion T12 to L3, Replaced pedicle screws bilaterally L1-L2 , Replaced left sided pedicle screws L-3. Instrumentation T12 to L3 using local bone graft, Vivigen allograft and  cancellous chips;  Surgeon: [name], MD;  Location: [facility];  Service: Orthopedics;  Laterality: N/A;  . LUMBAR LAMINECTOMY/DECOMPRESSION MICRODISCECTOMY N/A 01/28/2014   Procedure: Minimally Invasive Right  L1-2 Microdiscectomy;  Surgeon: [name], MD;  Location: [facility];  Service: Orthopedics;  Laterality: N/A;  . REVERSE SHOULDER ARTHROPLASTY Left 09/09/2019   Procedure: LEFT REVERSE SHOULDER REPLACEMENT;  Surgeon: Meredith Pel, MD;  Location: [facility];  Service: Orthopedics;  Laterality: Left;  . TOTAL HIP ARTHROPLASTY Right   . TOTAL KNEE ARTHROPLASTY  05/29/2012   Procedure: TOTAL KNEE ARTHROPLASTY;  Surgeon: Mcarthur Rossetti, MD;  Location: [facility];  Service: Orthopedics;  Laterality: Right;  Right Total Knee Arthroplasty  . TUBAL LIGATION    . UVULOPALATOPHARYNGOPLASTY    . VAGINAL HYSTERECTOMY     Social History   Occupational History  . Occupation: disability    Comment: back surgeries  Tobacco Use  . Smoking status: Current Every Day Smoker    Packs/day: 1.25    Years: 45.00    Pack years: 56.25    Types: Cigarettes  . Smokeless tobacco: Never Used  . Tobacco comment: form given 12/25/16  Vaping Use  . Vaping Use: Never used  Substance and Sexual Activity  . Alcohol use: No    Alcohol/week: 0.0 standard drinks  . Drug use: No    Types: Marijuana    Comment: 01/31/2016 "none since ~ 1980"  . Sexual activity: Never    Partners: Male

## 2020-03-15 NOTE — Patient Instructions (Addendum)
Avoid bending, stooping and avoid lifting weights greater than 10 lbs. Avoid prolong standing and walking. Avoid frequent bending and stooping  No lifting greater than 10 lbs. May use ice or moist heat for pain. Weight loss is of benefit. Handicap license is approved. Will prescribe one month supply of your medication for pain control and no more. In the interval you should contact [facility].  [facility] to enquire about a methadone maintenance program should The attempts at getting you into pain management prove to be unsuccessful. Knee is suffering from osteoarthritis, only real proven treatments are Weight loss, NSIADs like diclofenac and exercise. Well padded shoes help. Ice the knee that is suffering from osteoarthritis, only real proven treatments are  Well padded shoes help. Ice the knee 2-3 times a day 15-20 mins at a time.-3 times a day 15-20 mins at a time. Hot showers in the AM.  Injection with steroid may be of benefit. Hemp CBD capsules, amazon.com 5,000-7,000 mg per bottle, 60 capsules per bottle, take one capsule twice a day. Cane in the left hand to use with left leg weight bearing. Thumb splint. Call Dr. Carita Pian office and enquire as to whether surgery may be necessary for either carpal tunnel syndrome of thumb Arthritis.   Follow-Up Instructions: No follow-ups on file.

## 2020-03-16 ENCOUNTER — Other Ambulatory Visit: Payer: Self-pay | Admitting: Internal Medicine

## 2020-03-16 NOTE — Telephone Encounter (Signed)
meds refilled by ortho

## 2020-03-17 ENCOUNTER — Other Ambulatory Visit: Payer: Self-pay | Admitting: Internal Medicine

## 2020-03-17 DIAGNOSIS — G894 Chronic pain syndrome: Secondary | ICD-10-CM | POA: Diagnosis not present

## 2020-03-20 NOTE — Telephone Encounter (Signed)
FYI:  Pt has been scheduled with [facility] on 7/21

## 2020-03-23 ENCOUNTER — Telehealth: Payer: Self-pay

## 2020-03-23 NOTE — Telephone Encounter (Signed)
Med not on med list. Pls advise if ok to refill.Marland KitchenJohny Phillips

## 2020-03-23 NOTE — Telephone Encounter (Signed)
New message    1.Medication Requested:Nicotine patch   2. Pharmacy (Name, Street, City):[facility] - [place], [place] - [place]  3. On Med List: No   4. Last Visit with PCP: 5.21.21   5. Next visit date with PCP: 11.23.21    Agent: Please be advised that RX refills may take up to 3 business days. We ask that you follow-up with your pharmacy.

## 2020-03-26 MED ORDER — NICOTINE 21 MG/24HR TD PT24: 21.0000 mg | MEDICATED_PATCH | Freq: Every day | TRANSDERMAL | 1 refills | Status: DC

## 2020-03-27 ENCOUNTER — Other Ambulatory Visit: Payer: Self-pay | Admitting: Internal Medicine

## 2020-03-27 DIAGNOSIS — M25561 Pain in right knee: Secondary | ICD-10-CM | POA: Diagnosis not present

## 2020-03-27 DIAGNOSIS — M545 Low back pain: Secondary | ICD-10-CM | POA: Diagnosis not present

## 2020-03-27 DIAGNOSIS — Z1159 Encounter for screening for other viral diseases: Secondary | ICD-10-CM | POA: Diagnosis not present

## 2020-03-27 DIAGNOSIS — M25562 Pain in left knee: Secondary | ICD-10-CM | POA: Diagnosis not present

## 2020-03-27 DIAGNOSIS — G8929 Other chronic pain: Secondary | ICD-10-CM | POA: Diagnosis not present

## 2020-03-27 DIAGNOSIS — M129 Arthropathy, unspecified: Secondary | ICD-10-CM | POA: Diagnosis not present

## 2020-03-27 DIAGNOSIS — Z20822 Contact with and (suspected) exposure to covid-19: Secondary | ICD-10-CM | POA: Diagnosis not present

## 2020-03-27 DIAGNOSIS — F17210 Nicotine dependence, cigarettes, uncomplicated: Secondary | ICD-10-CM | POA: Diagnosis not present

## 2020-03-27 DIAGNOSIS — E559 Vitamin D deficiency, unspecified: Secondary | ICD-10-CM | POA: Diagnosis not present

## 2020-03-27 DIAGNOSIS — Z79899 Other long term (current) drug therapy: Secondary | ICD-10-CM | POA: Diagnosis not present

## 2020-03-31 ENCOUNTER — Other Ambulatory Visit: Payer: Self-pay | Admitting: Internal Medicine

## 2020-04-01 DIAGNOSIS — I10 Essential (primary) hypertension: Secondary | ICD-10-CM | POA: Diagnosis not present

## 2020-04-01 DIAGNOSIS — Z79899 Other long term (current) drug therapy: Secondary | ICD-10-CM | POA: Diagnosis not present

## 2020-04-01 DIAGNOSIS — M545 Low back pain: Secondary | ICD-10-CM | POA: Diagnosis not present

## 2020-04-03 ENCOUNTER — Telehealth: Payer: Self-pay

## 2020-04-03 NOTE — Telephone Encounter (Signed)
Elevate feet as much as possible.  Low-sodium diet.   Need to evaluate her tomorrow to determine further treatment

## 2020-04-03 NOTE — Telephone Encounter (Signed)
Spoke with patient and info given 

## 2020-04-03 NOTE — Telephone Encounter (Signed)
New message     The patient daughter C/o feet/ ankle/ toes are swelling appt is made on  8.3.21 asking what can they do in the meantime until appt on tomorrow.    Please advise

## 2020-04-03 NOTE — Progress Notes (Signed)
Subjective:    Patient ID: Jill Phillips, female    DOB: February 10, 1956, 64 y.o.   MRN: [id number]  HPI The patient is here for an acute visit.   Leg swelling:  The swell up every day.  They are not swollen today.  When she first wakes up the swelling is gone.  It gets worse throughout the day.  She is usually at her computer with her legs down a few hours a day.  She thinks it started when she started going on the computer reguarly.    Smoking:  She needs to quit smoking.  She smokes about one pack a day.  She has cut down.  She needs an rx of the patches.    She drinks one cup of coffee in the morning. She drinks one diet coke a day.  She has difficulty sleeping- the seroquel is not working.    Medications and allergies reviewed with patient and updated if appropriate.  Patient Active Problem List   Diagnosis Date Noted  . Interstitial cystitis 01/21/2020  . Status post shoulder surgery 09/09/2019  . Nausea 08/19/2019  . Anxiety 05/04/2019  . Insomnia 05/04/2019  . Syncope 12/08/2018  . Disorder of rotator cuff syndrome of left shoulder and allied disorder 06/22/2018  . Leg edema, left 06/04/2018  . Type 2 diabetes mellitus without complication, without long-term current use of insulin ([place]) 12/02/2017  . Sacral pain 09/17/2017  . COPD (chronic obstructive pulmonary disease) ([place]) 04/21/2017  . Lower back pain 03/03/2017  . CKD (chronic kidney disease) stage 3, GFR 30-59 ml/min 08/07/2016  . GERD (gastroesophageal reflux disease) 08/07/2016  . Chronic respiratory failure ([place]) 07/29/2016  . Arthritis of carpometacarpal [facility]) joint of left thumb 07/09/2016  . Pneumothorax on left 01/31/2016  . Chronic, continuous use of opioids 09/06/2015  . Degenerative disc disease, lumbar 08/01/2015  . Spondylolisthesis of lumbar region 08/01/2015  . Urinary, incontinence, stress female 05/06/2014  . Constipation 05/05/2014  . 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 HTN (hypertension) 05/19/2013  .  HLD (hyperlipidemia) 05/19/2013  . Depression 05/19/2013  . Hypothyroidism 05/19/2013  . Tobacco abuse   . Osteoarthritis of both knees 11/18/2011  . RLS (restless legs syndrome) 11/18/2011    Current Outpatient Medications on File Prior to Visit  Medication Sig Dispense Refill  . acetaminophen (TYLENOL) 500 MG tablet Take 1,000 mg by mouth every 6 (six) hours as needed (for headaches.).    [name] albuterol (PROAIR HFA) 108 (90 Base) MCG/ACT inhaler INHALE 1 PUFF INTO THE LUNGS EVERY 6 HOURS AS NEEDED FOR WHEEZING OR SHORTNESS OF BREATH.NEED OFFICE VISIT FOR FURTHER REFILLS (Patient taking differently: Inhale 1 puff into the lungs every 6 (six) hours as needed for wheezing or shortness of breath. ) 8.5 g 0  . amLODipine-benazepril (LOTREL) 10-40 MG capsule Take 1 capsule by mouth daily. 90 capsule 1  . atorvastatin (LIPITOR) 20 MG tablet TAKE 1 TABLET BY MOUTH DAILY. 90 tablet 2  . cimetidine (TAGAMET) 400 MG tablet Take 400 mg by mouth 2 (two) times daily.    . clonazePAM (KLONOPIN) 1 MG tablet TAKE 1 TABLET BY MOUTH 2 TIMES DAILY AS NEEDED FOR ANXIETY 60 tablet 0  . docusate sodium (COLACE) 100 MG capsule Take 200 mg by mouth at bedtime.    . fentaNYL (DURAGESIC) 50 MCG/HR PLACE 1 PATCH ONTO THE SKIN EVERY 3 DAYS. 7 patch 0  . gabapentin (NEURONTIN) 400 MG capsule TAKE 1 CAPSULE BY MOUTH 3 TIMES DAILY. 270 capsule  1  . HYDROcodone-acetaminophen (NORCO) 10-325 MG tablet Take 1 tablet by mouth every 6 (six) hours as needed for severe pain (Chronic back pain). 84 tablet 0  . levothyroxine (SYNTHROID) 88 MCG tablet TAKE 1 TABLET BY MOUTH DAILY. 90 tablet 1  . Methen-Hyosc-Meth Blue-Na Phos (ME/NAPHOS/MB/HYO1) 81.6 MG TABS Take 1 tablet by mouth 3 (three) times daily.    [name] NARCAN 4 MG/0.1ML LIQD nasal spray kit Place 1 spray into the nose as needed (accidental overdose). 1 each 0  . nicotine (NICODERM CQ - DOSED IN MG/24 HOURS) 21 mg/24hr patch Place 1 patch (21 mg total) onto the skin daily. 28 patch  1  . omeprazole (PRILOSEC) 40 MG capsule Take 40 mg by mouth 2 (two) times daily.    . pantoprazole (PROTONIX) 40 MG tablet Take 1 tablet (40 mg total) by mouth daily. 180 tablet 1  . promethazine (PHENERGAN) 25 MG tablet Take 1 tablet (25 mg total) by mouth every 8 (eight) hours as needed for nausea or vomiting. 30 tablet 0  . QUEtiapine (SEROQUEL) 100 MG tablet Take 1 tablet (100 mg total) by mouth at bedtime. Need office visit for more refills. 30 tablet 20  . Sennosides (SENNA LAX PO) Take 2 tablets by mouth at bedtime.     [name] SPIRIVA HANDIHALER 18 MCG inhalation capsule PLACE 1 CAPSULE INTO INHALER AND INHALE DAILY. 30 capsule 2  . tiZANidine (ZANAFLEX) 4 MG tablet TAKE 1 TABLET BY MOUTH EVERY 8 HOURS AS NEEDED 90 tablet 2  . valACYclovir (VALTREX) 500 MG tablet TAKE 1 TABLET BY MOUTH DAILY. 90 tablet 0  . venlafaxine XR (EFFEXOR-XR) 150 MG 24 hr capsule TAKE 2 CAPSULES BY MOUTH DAILY. 180 capsule 0   No current facility-administered medications on file prior to visit.    Past Medical History:  Diagnosis Date  . Active smoker   . Anxiety   . Calcifying tendinitis of shoulder   . Chronic back pain   . Chronic pain syndrome   . COPD (chronic obstructive pulmonary disease) ([place])   . Dysthymic disorder   . Emphysema lung ([place])   . GERD (gastroesophageal reflux disease)   . Headache    "weekly maybe" (01/31/2016)  . Heart murmur    for years, nothing to be concerned about  . Herpes genitalia   . History of blood transfusion 1980   related to "back surgery"  . Hyperlipidemia   . Hypertension   . Hypothyroidism   . Lumbago   . Osteoarthrosis, unspecified whether generalized or localized, lower leg   . Pain in joint, upper arm   . Pneumothorax, left 01/31/2016   S/P Left posterior subcostal pain injection on 01/30/2016  . PONV (postoperative nausea and vomiting)    gets nauseous  with longer surgery. Difficuty voiding after surgery  . Postlaminectomy syndrome, thoracic region   .  Primary localized osteoarthrosis, lower leg   . Restless leg syndrome   . Sleep apnea    s/p surgery- last sleep study 2011- doesnt use oxygen or machine at night as instructed,.   12/2014- Dr Halford Chessman  reports it is negative.  . Thyroid disease     Past Surgical History:  Procedure Laterality Date  . APPENDECTOMY    . BACK SURGERY     18 back surgeries (2 thoracic & 16 lumbar) (01/31/2016)  . DILATION AND CURETTAGE OF UTERUS    . HAMMER TOE SURGERY    . IR RADIOLOGIST EVAL & MGMT  07/21/2017  . JOINT REPLACEMENT    .  KNEE ARTHROSCOPY Right   . LAPAROSCOPIC CHOLECYSTECTOMY    . LUMBAR FUSION N/A 08/01/2015   Procedure: Right sided L1-2 and L2-3 transforaminal lumbar interbody fusion with cages, Extension of posterior fusion T12 to L3, Replaced pedicle screws bilaterally L1-L2 , Replaced left sided pedicle screws L-3. Instrumentation T12 to L3 using local bone graft, Vivigen allograft and cancellous chips;  Surgeon: [name], MD;  Location: [facility];  Service: Orthopedics;  Laterality: N/A;  . LUMBAR LAMINECTOMY/DECOMPRESSION MICRODISCECTOMY N/A 01/28/2014   Procedure: Minimally Invasive Right  L1-2 Microdiscectomy;  Surgeon: [name], MD;  Location: [facility] [facility];  Service: Orthopedics;  Laterality: N/A;  . REVERSE SHOULDER ARTHROPLASTY Left 09/09/2019   Procedure: LEFT REVERSE SHOULDER REPLACEMENT;  Surgeon: Meredith Pel, MD;  Location: [facility];  Service: Orthopedics;  Laterality: Left;  . TOTAL HIP ARTHROPLASTY Right   . TOTAL KNEE ARTHROPLASTY  05/29/2012   Procedure: TOTAL KNEE ARTHROPLASTY;  Surgeon: Mcarthur Rossetti, MD;  Location: [facility];  Service: Orthopedics;  Laterality: Right;  Right Total Knee Arthroplasty  . TUBAL LIGATION    . UVULOPALATOPHARYNGOPLASTY    . VAGINAL HYSTERECTOMY      Social History   Socioeconomic History  . Marital status: Divorced    Spouse name: n/a  . Number of children: 2  . Years of education: 12+  . Highest education level: Not on file    Occupational History  . Occupation: disability    Comment: back surgeries  Tobacco Use  . Smoking status: Current Every Day Smoker    Packs/day: 1.25    Years: 45.00    Pack years: 56.25    Types: Cigarettes  . Smokeless tobacco: Never Used  . Tobacco comment: form given 12/25/16  Vaping Use  . Vaping Use: Never used  Substance and Sexual Activity  . Alcohol use: No    Alcohol/week: 0.0 standard drinks  . Drug use: No    Types: Marijuana    Comment: 01/31/2016 "none since ~ 1980"  . Sexual activity: Never    Partners: Male  Other Topics Concern  . Not on file  Social History Narrative   Lives alone.  One daughter is local, but is getting ready to move to [place], where her children live with their father.  The other daughter lives near [place], [place].   Social Determinants of Health   Financial Resource Strain:   . Difficulty of Paying Living Expenses:   Food Insecurity:   . Worried About Charity fundraiser in the Last Year:   . Arboriculturist in the Last Year:   Transportation Needs:   . Film/video editor (Medical):   [name] Lack of Transportation (Non-Medical):   Physical Activity: Inactive  . Days of Exercise per Week: 0 days  . Minutes of Exercise per Session: 0 min  Stress:   . Feeling of Stress :   Social Connections:   . Frequency of Communication with Friends and Family:   . Frequency of Social Gatherings with Friends and Family:   . Attends Religious Services:   . Active Member of Clubs or Organizations:   . Attends Archivist Meetings:   [name] Marital Status:     Family History  Problem Relation Age of Onset  . Kidney disease Mother   . Heart disease Father   . Anuerysm Brother 29       brain  . Heart disease Brother   . Heart disease Sister 24  s/p CABG  . Hypertension Sister   . Colon cancer Neg Hx     Review of Systems  Constitutional: Negative for fever.  Respiratory: Negative for shortness of breath.   Cardiovascular:  Positive for leg swelling. Negative for chest pain and palpitations.  Neurological: Negative for light-headedness and headaches.       Objective:   Vitals:   04/04/20 1450  BP: (!) 148/84  Pulse: 87  Temp: 98.2 F (36.8 C)  SpO2: 99%   BP Readings from Last 3 Encounters:  04/04/20 (!) 148/84  03/15/20 106/68  01/21/20 (!) 152/88   Wt Readings from Last 3 Encounters:  04/04/20 152 lb (68.9 kg)  03/15/20 145 lb (65.8 kg)  01/21/20 144 lb 9.6 oz (65.6 kg)   Body mass index is 26.09 kg/m.   Physical Exam    Constitutional: Appears well-developed and well-nourished. No distress.  Head: Normocephalic and atraumatic.  Neck: Neck supple. No tracheal deviation present. No thyromegaly present.  No cervical lymphadenopathy Cardiovascular: Normal rate, regular rhythm and normal heart sounds.  No murmur heard. No carotid bruit .  No edema Pulmonary/Chest: Effort normal and breath sounds normal. No respiratory distress. No has no wheezes. No rales.  Skin: Skin is warm and dry. Not diaphoretic.  Psychiatric: Normal mood and affect. Behavior is normal.       Assessment & Plan:      See Problem List for Assessment and Plan of chronic medical problems.    This visit occurred during the SARS-CoV-2 public health emergency.  Safety protocols were in place, including screening questions prior to the visit, additional usage of staff PPE, and extensive cleaning of exam room while observing appropriate contact time as indicated for disinfecting solutions.

## 2020-04-04 ENCOUNTER — Encounter: Payer: Self-pay | Admitting: Internal Medicine

## 2020-04-04 ENCOUNTER — Ambulatory Visit (INDEPENDENT_AMBULATORY_CARE_PROVIDER_SITE_OTHER): Payer: Medicare Other | Admitting: Internal Medicine

## 2020-04-04 ENCOUNTER — Other Ambulatory Visit: Payer: Self-pay

## 2020-04-04 DIAGNOSIS — F419 Anxiety disorder, unspecified: Secondary | ICD-10-CM

## 2020-04-04 DIAGNOSIS — R600 Localized edema: Secondary | ICD-10-CM | POA: Diagnosis not present

## 2020-04-04 DIAGNOSIS — Z720 Tobacco use: Secondary | ICD-10-CM | POA: Diagnosis not present

## 2020-04-04 NOTE — Assessment & Plan Note (Signed)
Acute on chronic ? Related to being on the computer more and having her legs down - when she started doing this more is when the swelling started Not always compliant with a low sodium diet - stressed low sodium diet Can try compression socks Elevate legs when sitting - will try being on her computer while in the recliner Amlodipine may be contributing but she has been on that for a while Try above first and let me know if that does not help

## 2020-04-04 NOTE — Patient Instructions (Addendum)
Your nicotine patches were sent 7.25.    Try elevating your legs while on the computer.  If your swelling does not improve let me know.     Try 1-800-QUIT-NOW for free patches if they are not covered.

## 2020-04-04 NOTE — Assessment & Plan Note (Signed)
Chronic Controlled, stable Continue current dose of medication effexor 300 mg daily Clonazepam 1 mg BID

## 2020-04-04 NOTE — Assessment & Plan Note (Signed)
Chronic Smoking 1 ppd Wants rx for nicotine patches - if not covered can call 1-800-quit-now

## 2020-04-05 ENCOUNTER — Ambulatory Visit: Payer: Medicare Other | Admitting: Internal Medicine

## 2020-04-12 ENCOUNTER — Other Ambulatory Visit: Payer: Self-pay | Admitting: Ophthalmology

## 2020-04-12 DIAGNOSIS — N3010 Interstitial cystitis (chronic) without hematuria: Secondary | ICD-10-CM | POA: Diagnosis not present

## 2020-04-12 DIAGNOSIS — N3289 Other specified disorders of bladder: Secondary | ICD-10-CM | POA: Diagnosis not present

## 2020-04-14 ENCOUNTER — Other Ambulatory Visit: Payer: Self-pay | Admitting: Internal Medicine

## 2020-04-25 ENCOUNTER — Other Ambulatory Visit: Payer: Self-pay | Admitting: Internal Medicine

## 2020-05-01 DIAGNOSIS — G2581 Restless legs syndrome: Secondary | ICD-10-CM | POA: Diagnosis not present

## 2020-05-01 DIAGNOSIS — M545 Low back pain: Secondary | ICD-10-CM | POA: Diagnosis not present

## 2020-05-01 DIAGNOSIS — Z79899 Other long term (current) drug therapy: Secondary | ICD-10-CM | POA: Diagnosis not present

## 2020-05-01 DIAGNOSIS — I10 Essential (primary) hypertension: Secondary | ICD-10-CM | POA: Diagnosis not present

## 2020-05-23 ENCOUNTER — Other Ambulatory Visit: Payer: Self-pay | Admitting: Internal Medicine

## 2020-05-23 ENCOUNTER — Other Ambulatory Visit: Payer: Self-pay | Admitting: Specialist

## 2020-05-25 ENCOUNTER — Encounter: Payer: Self-pay | Admitting: Internal Medicine

## 2020-05-29 NOTE — Progress Notes (Deleted)
Subjective:    Patient ID: Jill Phillips, female    DOB: 1956/02/21, 64 y.o.   MRN: [id number]  HPI The patient is here for an acute visit.   ? UTI:  Right swollen butt cheek:   Insomnia:   Medications and allergies reviewed with patient and updated if appropriate.  Patient Active Problem List   Diagnosis Date Noted  . Interstitial cystitis 01/21/2020  . Status post shoulder surgery 09/09/2019  . Nausea 08/19/2019  . Anxiety 05/04/2019  . Insomnia 05/04/2019  . Syncope 12/08/2018  . Disorder of rotator cuff syndrome of left shoulder and allied disorder 06/22/2018  . Bilateral leg edema 06/04/2018  . Type 2 diabetes mellitus without complication, without long-term current use of insulin ([place]) 12/02/2017  . Sacral pain 09/17/2017  . COPD (chronic obstructive pulmonary disease) ([place]) 04/21/2017  . Lower back pain 03/03/2017  . CKD (chronic kidney disease) stage 3, GFR 30-59 ml/min 08/07/2016  . GERD (gastroesophageal reflux disease) 08/07/2016  . Chronic respiratory failure ([place]) 07/29/2016  . Arthritis of carpometacarpal [facility]) joint of left thumb 07/09/2016  . Pneumothorax on left 01/31/2016  . Chronic, continuous use of opioids 09/06/2015  . Degenerative disc disease, lumbar 08/01/2015  . Spondylolisthesis of lumbar region 08/01/2015  . Urinary, incontinence, stress female 05/06/2014  . Constipation 05/05/2014  . 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 HTN (hypertension) 05/19/2013  . HLD (hyperlipidemia) 05/19/2013  . Depression 05/19/2013  . Hypothyroidism 05/19/2013  . Tobacco abuse   . Osteoarthritis of both knees 11/18/2011  . RLS (restless legs syndrome) 11/18/2011    Current Outpatient Medications on File Prior to Visit  Medication Sig Dispense Refill  . clonazePAM (KLONOPIN) 1 MG tablet TAKE 1 TABLET BY MOUTH 2 TIMES DAILY AS NEEDED FOR ANXIETY 60 tablet 0  . acetaminophen (TYLENOL) 500 MG tablet Take 1,000 mg by mouth every 6 (six) hours as needed (for  headaches.).    [name] albuterol (PROAIR HFA) 108 (90 Base) MCG/ACT inhaler INHALE 1 PUFF INTO THE LUNGS EVERY 6 HOURS AS NEEDED FOR WHEEZING OR SHORTNESS OF BREATH.NEED OFFICE VISIT FOR FURTHER REFILLS (Patient taking differently: Inhale 1 puff into the lungs every 6 (six) hours as needed for wheezing or shortness of breath. ) 8.5 g 0  . amLODipine-benazepril (LOTREL) 10-40 MG capsule TAKE 1 CAPSULE BY MOUTH DAILY. 90 capsule 1  . atorvastatin (LIPITOR) 20 MG tablet TAKE 1 TABLET BY MOUTH DAILY. 90 tablet 2  . cimetidine (TAGAMET) 400 MG tablet Take 400 mg by mouth 2 (two) times daily.    [name] docusate sodium (COLACE) 100 MG capsule Take 200 mg by mouth at bedtime.    . fentaNYL (DURAGESIC) 50 MCG/HR PLACE 1 PATCH ONTO THE SKIN EVERY 3 DAYS. 7 patch 0  . gabapentin (NEURONTIN) 400 MG capsule TAKE 1 CAPSULE BY MOUTH 3 TIMES DAILY. 270 capsule 1  . HYDROcodone-acetaminophen (NORCO) 10-325 MG tablet Take 1 tablet by mouth every 6 (six) hours as needed for severe pain (Chronic back pain). 84 tablet 0  . levothyroxine (SYNTHROID) 88 MCG tablet TAKE 1 TABLET BY MOUTH DAILY. 90 tablet 1  . Methen-Hyosc-Meth Blue-Na Phos (ME/NAPHOS/MB/HYO1) 81.6 MG TABS Take 1 tablet by mouth 3 (three) times daily.    [name] NARCAN 4 MG/0.1ML LIQD nasal spray kit Place 1 spray into the nose as needed (accidental overdose). 1 each 0  . nicotine (NICODERM CQ - DOSED IN MG/24 HOURS) 21 mg/24hr patch Place 1 patch (21 mg total) onto the skin daily. 28 patch  1  . omeprazole (PRILOSEC) 40 MG capsule Take 40 mg by mouth 2 (two) times daily.    . pantoprazole (PROTONIX) 40 MG tablet Take 1 tablet (40 mg total) by mouth daily. 180 tablet 1  . QUEtiapine (SEROQUEL) 100 MG tablet Take 1 tablet (100 mg total) by mouth at bedtime. Need office visit for more refills. 30 tablet 20  . Sennosides (SENNA LAX PO) Take 2 tablets by mouth at bedtime.     [name] SPIRIVA HANDIHALER 18 MCG inhalation capsule PLACE 1 CAPSULE INTO INHALER AND INHALE DAILY. 30  capsule 2  . tiZANidine (ZANAFLEX) 4 MG tablet TAKE 1 TABLET BY MOUTH EVERY 8 HOURS AS NEEDED 90 tablet 2  . valACYclovir (VALTREX) 500 MG tablet TAKE 1 TABLET BY MOUTH DAILY. 90 tablet 0  . venlafaxine XR (EFFEXOR-XR) 150 MG 24 hr capsule TAKE 2 CAPSULES BY MOUTH DAILY. 180 capsule 0   No current facility-administered medications on file prior to visit.    Past Medical History:  Diagnosis Date  . Active smoker   . Anxiety   . Calcifying tendinitis of shoulder   . Chronic back pain   . Chronic pain syndrome   . COPD (chronic obstructive pulmonary disease) ([place])   . Dysthymic disorder   . Emphysema lung ([place])   . GERD (gastroesophageal reflux disease)   . Headache    "weekly maybe" (01/31/2016)  . Heart murmur    for years, nothing to be concerned about  . Herpes genitalia   . History of blood transfusion 1980   related to "back surgery"  . Hyperlipidemia   . Hypertension   . Hypothyroidism   . Lumbago   . Osteoarthrosis, unspecified whether generalized or localized, lower leg   . Pain in joint, upper arm   . Pneumothorax, left 01/31/2016   S/P Left posterior subcostal pain injection on 01/30/2016  . PONV (postoperative nausea and vomiting)    gets nauseous  with longer surgery. Difficuty voiding after surgery  . Postlaminectomy syndrome, thoracic region   . Primary localized osteoarthrosis, lower leg   . Restless leg syndrome   . Sleep apnea    s/p surgery- last sleep study 2011- doesnt use oxygen or machine at night as instructed,.   12/2014- Dr Halford Chessman  reports it is negative.  . Thyroid disease     Past Surgical History:  Procedure Laterality Date  . APPENDECTOMY    . BACK SURGERY     18 back surgeries (2 thoracic & 16 lumbar) (01/31/2016)  . DILATION AND CURETTAGE OF UTERUS    . HAMMER TOE SURGERY    . IR RADIOLOGIST EVAL & MGMT  07/21/2017  . JOINT REPLACEMENT    . KNEE ARTHROSCOPY Right   . LAPAROSCOPIC CHOLECYSTECTOMY    . LUMBAR FUSION N/A 08/01/2015    Procedure: Right sided L1-2 and L2-3 transforaminal lumbar interbody fusion with cages, Extension of posterior fusion T12 to L3, Replaced pedicle screws bilaterally L1-L2 , Replaced left sided pedicle screws L-3. Instrumentation T12 to L3 using local bone graft, Vivigen allograft and cancellous chips;  Surgeon: [name], MD;  Location: [facility];  Service: Orthopedics;  Laterality: N/A;  . LUMBAR LAMINECTOMY/DECOMPRESSION MICRODISCECTOMY N/A 01/28/2014   Procedure: Minimally Invasive Right  L1-2 Microdiscectomy;  Surgeon: [name], MD;  Location: [facility];  Service: Orthopedics;  Laterality: N/A;  . REVERSE SHOULDER ARTHROPLASTY Left 09/09/2019   Procedure: LEFT REVERSE SHOULDER REPLACEMENT;  Surgeon: Meredith Pel, MD;  Location: [facility];  Service: Orthopedics;  Laterality: Left;  . TOTAL HIP ARTHROPLASTY Right   . TOTAL KNEE ARTHROPLASTY  05/29/2012   Procedure: TOTAL KNEE ARTHROPLASTY;  Surgeon: Christopher Y Blackman, MD;  [facility];  Service: Orthopedics;  Laterality: Right;  Right Total Knee Arthroplasty  . TUBAL LIGATION    . UVULOPALATOPHARYNGOPLASTY    . VAGINAL HYSTERECTOMY      Social History   Socioeconomic History  . Marital status: Divorced    Spouse name: n/a  . Number of children: 2  . Years of education: 12+  . Highest education level: Not on file  Occupational History  . Occupation: disability    Comment: back surgeries  Tobacco Use  . Smoking status: Current Every Day Smoker    Packs/day: 1.25    Years: 45.00    Pack years: 56.25    Types: Cigarettes  . Smokeless tobacco: Never Used  . Tobacco comment: form given 12/25/16  Vaping Use  . Vaping Use: Never used  Substance and Sexual Activity  . Alcohol use: No    Alcohol/week: 0.0 standard drinks  . Drug use: No    Types: Marijuana    Comment: 01/31/2016 "none since ~ 1980"  . Sexual activity: Never    Partners: Male  Other Topics Concern  . Not on file  Social History Narrative   Lives alone.   One daughter is local, but is getting ready to move to [place], where her children live with their father.  The other daughter lives near [place], [place].   Social Determinants of Health   Financial Resource Strain:   . Difficulty of Paying Living Expenses: Not on file  Food Insecurity:   . Worried About Running Out of Food in the Last Year: Not on file  . Ran Out of Food in the Last Year: Not on file  Transportation Needs:   . Lack of Transportation (Medical): Not on file  . Lack of Transportation (Non-Medical): Not on file  Physical Activity: Inactive  . Days of Exercise per Week: 0 days  . Minutes of Exercise per Session: 0 min  Stress:   . Feeling of Stress : Not on file  Social Connections:   . Frequency of Communication with Friends and Family: Not on file  . Frequency of Social Gatherings with Friends and Family: Not on file  . Attends Religious Services: Not on file  . Active Member of Clubs or Organizations: Not on file  . Attends Club or Organization Meetings: Not on file  . Marital Status: Not on file    Family History  Problem Relation Age of Onset  . Kidney disease Mother   . Heart disease Father   . Anuerysm Brother 29       brain  . Heart disease Brother   . Heart disease Sister 45       s/p CABG  . Hypertension Sister   . Colon cancer Neg Hx     Review of Systems     Objective:  There were no vitals filed for this visit. BP Readings from Last 3 Encounters:  04/04/20 (!) 148/84  03/15/20 106/68  01/21/20 (!) 152/88   Wt Readings from Last 3 Encounters:  04/04/20 152 lb (68.9 kg)  03/15/20 145 lb (65.8 kg)  01/21/20 144 lb 9.6 oz (65.6 kg)   There is no height or weight on file to calculate BMI.   Physical Exam         Assessment & Plan:      See Problem List for Assessment and Plan of chronic medical problems.    This visit occurred during the SARS-CoV-2 public health emergency.  Safety protocols were in place, including screening  questions prior to the visit, additional usage of staff PPE, and extensive cleaning of exam room while observing appropriate contact time as indicated for disinfecting solutions.

## 2020-05-30 ENCOUNTER — Ambulatory Visit: Payer: Medicare Other | Admitting: Internal Medicine

## 2020-05-31 DIAGNOSIS — M545 Low back pain: Secondary | ICD-10-CM | POA: Diagnosis not present

## 2020-05-31 DIAGNOSIS — K6289 Other specified diseases of anus and rectum: Secondary | ICD-10-CM | POA: Diagnosis not present

## 2020-05-31 DIAGNOSIS — Z79899 Other long term (current) drug therapy: Secondary | ICD-10-CM | POA: Diagnosis not present

## 2020-06-01 ENCOUNTER — Ambulatory Visit: Payer: Medicare Other | Admitting: Orthopaedic Surgery

## 2020-06-02 ENCOUNTER — Ambulatory Visit: Payer: Medicare Other | Admitting: Surgical

## 2020-06-09 ENCOUNTER — Other Ambulatory Visit: Payer: Self-pay | Admitting: Internal Medicine

## 2020-06-14 ENCOUNTER — Other Ambulatory Visit: Payer: Self-pay | Admitting: Internal Medicine

## 2020-06-16 ENCOUNTER — Ambulatory Visit: Payer: Medicare Other | Admitting: Family

## 2020-06-19 NOTE — Progress Notes (Signed)
Subjective:    Patient ID: Jill Phillips, female    DOB: 02/18/1956, 64 y.o.   MRN: [id number]  HPI The patient is here for follow up of their chronic medical problems, including htn, insomnia, hyperlipidemia, hypothyroidism, gerd, depression, anxiety, chronic pain, diabetes, CKD  She is having urinary frequency.     Feet and ankle was swollen- has been on and off.  She sits at her computer during the day.  Her right first toe is very painful and it is hard to walk - she denies injury - noticed it today.   Depressed - daughter - staying out.  Causing increased stress.    BP elevated due to stress.   Insomnia - taking seroquel, gabapnetin, melatonin, muscle relaxer, clonazepam.  She is also taking 5 benadryl with it.       Medications and allergies reviewed with patient and updated if appropriate.  Patient Active Problem List   Diagnosis Date Noted  . Interstitial cystitis 01/21/2020  . Status post shoulder surgery 09/09/2019  . Nausea 08/19/2019  . Anxiety 05/04/2019  . Insomnia 05/04/2019  . Syncope 12/08/2018  . Disorder of rotator cuff syndrome of left shoulder and allied disorder 06/22/2018  . Bilateral leg edema 06/04/2018  . Type 2 diabetes mellitus without complication, without long-term current use of insulin ([place]) 12/02/2017  . Sacral pain 09/17/2017  . COPD (chronic obstructive pulmonary disease) ([place]) 04/21/2017  . Lower back pain 03/03/2017  . CKD (chronic kidney disease) stage 3, GFR 30-59 ml/min (HCC) 08/07/2016  . GERD (gastroesophageal reflux disease) 08/07/2016  . Chronic respiratory failure ([place]) 07/29/2016  . Arthritis of carpometacarpal [facility]) joint of left thumb 07/09/2016  . Pneumothorax on left 01/31/2016  . Chronic, continuous use of opioids 09/06/2015  . Degenerative disc disease, lumbar 08/01/2015  . Spondylolisthesis of lumbar region 08/01/2015  . Urinary, incontinence, stress female 05/06/2014  . Constipation 05/05/2014  . HSV infection  07/14/2013  . HTN (hypertension) 05/19/2013  . HLD (hyperlipidemia) 05/19/2013  . Depression 05/19/2013  . Hypothyroidism 05/19/2013  . Tobacco abuse   . Osteoarthritis of both knees 11/18/2011  . RLS (restless legs syndrome) 11/18/2011    Current Outpatient Medications on File Prior to Visit  Medication Sig Dispense Refill  . acetaminophen (TYLENOL) 500 MG tablet Take 1,000 mg by mouth every 6 (six) hours as needed (for headaches.).    [name] albuterol (PROAIR HFA) 108 (90 Base) MCG/ACT inhaler INHALE 1 PUFF INTO THE LUNGS EVERY 6 HOURS AS NEEDED FOR WHEEZING OR SHORTNESS OF BREATH.NEED OFFICE VISIT FOR FURTHER REFILLS (Patient taking differently: Inhale 1 puff into the lungs every 6 (six) hours as needed for wheezing or shortness of breath. ) 8.5 g 0  . amLODipine-benazepril (LOTREL) 10-40 MG capsule TAKE 1 CAPSULE BY MOUTH DAILY. 90 capsule 1  . atorvastatin (LIPITOR) 20 MG tablet TAKE 1 TABLET BY MOUTH DAILY. 90 tablet 2  . cimetidine (TAGAMET) 400 MG tablet Take 400 mg by mouth 2 (two) times daily.    . clonazePAM (KLONOPIN) 1 MG tablet TAKE 1 TABLET BY MOUTH 2 TIMES DAILY AS NEEDED FOR ANXIETY 60 tablet 0  . docusate sodium (COLACE) 100 MG capsule Take 200 mg by mouth at bedtime.    . fentaNYL (DURAGESIC) 50 MCG/HR PLACE 1 PATCH ONTO THE SKIN EVERY 3 DAYS. 7 patch 0  . fentaNYL 37.5 MCG/HR PT72 Apply 1 patch topically every 3 (three) days.    [name] gabapentin (NEURONTIN) 400 MG capsule TAKE 1 CAPSULE BY MOUTH  3 TIMES DAILY. 270 capsule 1  . HYDROcodone-acetaminophen (NORCO) 10-325 MG tablet Take 1 tablet by mouth every 6 (six) hours as needed for severe pain (Chronic back pain). 84 tablet 0  . levothyroxine (SYNTHROID) 88 MCG tablet TAKE 1 TABLET BY MOUTH DAILY. 90 tablet 1  . Methen-Hyosc-Meth Blue-Na Phos (ME/NAPHOS/MB/HYO1) 81.6 MG TABS Take 1 tablet by mouth 3 (three) times daily.    [name] NARCAN 4 MG/0.1ML LIQD nasal spray kit Place 1 spray into the nose as needed (accidental overdose). 1 each  0  . nicotine (NICODERM CQ - DOSED IN MG/24 HOURS) 21 mg/24hr patch Place 1 patch (21 mg total) onto the skin daily. 28 patch 1  . omeprazole (PRILOSEC) 40 MG capsule Take 40 mg by mouth 2 (two) times daily.    . QUEtiapine (SEROQUEL) 100 MG tablet Take 1 tablet (100 mg total) by mouth at bedtime. Need office visit for more refills. 30 tablet 20  . rOPINIRole (REQUIP) 1 MG tablet SMARTSIG:1 Tablet(s) By Mouth Every Evening    . Sennosides (SENNA LAX PO) Take 2 tablets by mouth at bedtime.     [name] SPIRIVA HANDIHALER 18 MCG inhalation capsule PLACE 1 CAPSULE INTO INHALER AND INHALE DAILY. 30 capsule 2  . tiZANidine (ZANAFLEX) 4 MG tablet TAKE 1 TABLET BY MOUTH EVERY 8 HOURS AS NEEDED 90 tablet 2  . valACYclovir (VALTREX) 500 MG tablet TAKE 1 TABLET BY MOUTH DAILY. 90 tablet 0  . venlafaxine XR (EFFEXOR-XR) 150 MG 24 hr capsule TAKE 2 CAPSULES BY MOUTH DAILY. 180 capsule 0  . pantoprazole (PROTONIX) 40 MG tablet Take 1 tablet (40 mg total) by mouth daily. (Patient not taking: Reported on 06/20/2020) 180 tablet 1   No current facility-administered medications on file prior to visit.    Past Medical History:  Diagnosis Date  . Active smoker   . Anxiety   . Calcifying tendinitis of shoulder   . Chronic back pain   . Chronic pain syndrome   . COPD (chronic obstructive pulmonary disease) ([place])   . Dysthymic disorder   . Emphysema lung ([place])   . GERD (gastroesophageal reflux disease)   . Headache    "weekly maybe" (01/31/2016)  . Heart murmur    for years, nothing to be concerned about  . Herpes genitalia   . History of blood transfusion 1980   related to "back surgery"  . Hyperlipidemia   . Hypertension   . Hypothyroidism   . Lumbago   . Osteoarthrosis, unspecified whether generalized or localized, lower leg   . Pain in joint, upper arm   . Pneumothorax, left 01/31/2016   S/P Left posterior subcostal pain injection on 01/30/2016  . PONV (postoperative nausea and vomiting)    gets  nauseous  with longer surgery. Difficuty voiding after surgery  . Postlaminectomy syndrome, thoracic region   . Primary localized osteoarthrosis, lower leg   . Restless leg syndrome   . Sleep apnea    s/p surgery- last sleep study 2011- doesnt use oxygen or machine at night as instructed,.   12/2014- Dr Halford Chessman  reports it is negative.  . Thyroid disease     Past Surgical History:  Procedure Laterality Date  . APPENDECTOMY    . BACK SURGERY     18 back surgeries (2 thoracic & 16 lumbar) (01/31/2016)  . DILATION AND CURETTAGE OF UTERUS    . HAMMER TOE SURGERY    . IR RADIOLOGIST EVAL & MGMT  07/21/2017  . JOINT REPLACEMENT    .  KNEE ARTHROSCOPY Right   . LAPAROSCOPIC CHOLECYSTECTOMY    . LUMBAR FUSION N/A 08/01/2015   Procedure: Right sided L1-2 and L2-3 transforaminal lumbar interbody fusion with cages, Extension of posterior fusion T12 to L3, Replaced pedicle screws bilaterally L1-L2 , Replaced left sided pedicle screws L-3. Instrumentation T12 to L3 using local bone graft, Vivigen allograft and cancellous chips;  Surgeon: [name], MD;  Location: [facility];  Service: Orthopedics;  Laterality: N/A;  . LUMBAR LAMINECTOMY/DECOMPRESSION MICRODISCECTOMY N/A 01/28/2014   Procedure: Minimally Invasive Right  L1-2 Microdiscectomy;  Surgeon: [name], MD;  Location: [facility];  Service: Orthopedics;  Laterality: N/A;  . REVERSE SHOULDER ARTHROPLASTY Left 09/09/2019   Procedure: LEFT REVERSE SHOULDER REPLACEMENT;  Surgeon: Meredith Pel, MD;  Location: [facility];  Service: Orthopedics;  Laterality: Left;  . TOTAL HIP ARTHROPLASTY Right   . TOTAL KNEE ARTHROPLASTY  05/29/2012   Procedure: TOTAL KNEE ARTHROPLASTY;  Surgeon: Mcarthur Rossetti, MD;  Location: [facility];  Service: Orthopedics;  Laterality: Right;  Right Total Knee Arthroplasty  . TUBAL LIGATION    . UVULOPALATOPHARYNGOPLASTY    . VAGINAL HYSTERECTOMY      Social History   Socioeconomic History  . Marital status: Divorced     Spouse name: n/a  . Number of children: 2  . Years of education: 12+  . Highest education level: Not on file  Occupational History  . Occupation: disability    Comment: back surgeries  Tobacco Use  . Smoking status: Current Every Day Smoker    Packs/day: 1.25    Years: 45.00    Pack years: 56.25    Types: Cigarettes  . Smokeless tobacco: Never Used  . Tobacco comment: form given 12/25/16  Vaping Use  . Vaping Use: Never used  Substance and Sexual Activity  . Alcohol use: No    Alcohol/week: 0.0 standard drinks  . Drug use: No    Types: Marijuana    Comment: 01/31/2016 "none since ~ 1980"  . Sexual activity: Never    Partners: Male  Other Topics Concern  . Not on file  Social History Narrative   Lives alone.  One daughter is local, but is getting ready to move to [place], where her children live with their father.  The other daughter lives near [place], [place].   Social Determinants of Health   Financial Resource Strain:   . Difficulty of Paying Living Expenses: Not on file  Food Insecurity:   . Worried About Charity fundraiser in the Last Year: Not on file  . Ran Out of Food in the Last Year: Not on file  Transportation Needs:   . Lack of Transportation (Medical): Not on file  . Lack of Transportation (Non-Medical): Not on file  Physical Activity: Inactive  . Days of Exercise per Week: 0 days  . Minutes of Exercise per Session: 0 min  Stress:   . Feeling of Stress : Not on file  Social Connections:   . Frequency of Communication with Friends and Family: Not on file  . Frequency of Social Gatherings with Friends and Family: Not on file  . Attends Religious Services: Not on file  . Active Member of Clubs or Organizations: Not on file  . Attends Archivist Meetings: Not on file  . Marital Status: Not on file    Family History  Problem Relation Age of Onset  . Kidney disease Mother   . Heart disease Father   . Anuerysm Brother 67  brain  . Heart  disease Brother   . Heart disease Sister 65       s/p CABG  . Hypertension Sister   . Colon cancer Neg Hx     Review of Systems  Constitutional: Negative for chills and fever.  Cardiovascular: Positive for leg swelling.  Genitourinary: Positive for frequency and pelvic pain (from IC intermittent). Negative for dysuria and hematuria.       Objective:   Vitals:   06/20/20 1127  BP: (!) 166/86  Pulse: (!) 104  Temp: 98.5 F (36.9 C)  SpO2: 95%   BP Readings from Last 3 Encounters:  06/20/20 (!) 166/86  04/04/20 (!) 148/84  03/15/20 106/68   Wt Readings from Last 3 Encounters:  06/20/20 156 lb (70.8 kg)  04/04/20 152 lb (68.9 kg)  03/15/20 145 lb (65.8 kg)   Body mass index is 26.78 kg/m.   Physical Exam    Constitutional: Appears well-developed and well-nourished. No distress.  HENT:  Head: Normocephalic and atraumatic.  Neck: Neck supple. No tracheal deviation present. No thyromegaly present.  No cervical lymphadenopathy Cardiovascular: Normal rate, regular rhythm and normal heart sounds.   No murmur heard. No carotid bruit .  Mild b/l foot edema Pulmonary/Chest: Effort normal and breath sounds normal. No respiratory distress. No has no wheezes. No rales.  Msk; R first toe erythematous, slight warm and pain with putting pressure on foot and walking Skin: Skin is warm and dry. Not diaphoretic.  Psychiatric: Normal mood and affect. Behavior is normal.      Assessment & Plan:    See Problem List for Assessment and Plan of chronic medical problems.    This visit occurred during the SARS-CoV-2 public health emergency.  Safety protocols were in place, including screening questions prior to the visit, additional usage of staff PPE, and extensive cleaning of exam room while observing appropriate contact time as indicated for disinfecting solutions.

## 2020-06-19 NOTE — Patient Instructions (Addendum)
  Blood work was ordered.     Medications reviewed and updated.  Changes include :  Start ambien 5 mg at night.  Stop the seroquel.   Start prednisone taper..   Start hydralazine for your blood pressure      Your prescription(s) have been submitted to your pharmacy. Please take as directed and contact our office if you believe you are having problem(s) with the medication(s).    Please followup in 3 week

## 2020-06-20 ENCOUNTER — Ambulatory Visit (INDEPENDENT_AMBULATORY_CARE_PROVIDER_SITE_OTHER): Payer: Medicare Other | Admitting: Internal Medicine

## 2020-06-20 ENCOUNTER — Other Ambulatory Visit: Payer: Self-pay

## 2020-06-20 ENCOUNTER — Encounter: Payer: Self-pay | Admitting: Internal Medicine

## 2020-06-20 VITALS — BP 166/86 | HR 104 | Temp 98.5°F | Ht 64.0 in | Wt 156.0 lb

## 2020-06-20 DIAGNOSIS — E038 Other specified hypothyroidism: Secondary | ICD-10-CM | POA: Diagnosis not present

## 2020-06-20 DIAGNOSIS — K219 Gastro-esophageal reflux disease without esophagitis: Secondary | ICD-10-CM

## 2020-06-20 DIAGNOSIS — F419 Anxiety disorder, unspecified: Secondary | ICD-10-CM

## 2020-06-20 DIAGNOSIS — E7849 Other hyperlipidemia: Secondary | ICD-10-CM

## 2020-06-20 DIAGNOSIS — N1831 Chronic kidney disease, stage 3a: Secondary | ICD-10-CM

## 2020-06-20 DIAGNOSIS — E119 Type 2 diabetes mellitus without complications: Secondary | ICD-10-CM | POA: Diagnosis not present

## 2020-06-20 DIAGNOSIS — M10371 Gout due to renal impairment, right ankle and foot: Secondary | ICD-10-CM

## 2020-06-20 DIAGNOSIS — M109 Gout, unspecified: Secondary | ICD-10-CM

## 2020-06-20 DIAGNOSIS — F3289 Other specified depressive episodes: Secondary | ICD-10-CM

## 2020-06-20 DIAGNOSIS — I10 Essential (primary) hypertension: Secondary | ICD-10-CM

## 2020-06-20 DIAGNOSIS — R300 Dysuria: Secondary | ICD-10-CM

## 2020-06-20 DIAGNOSIS — G4709 Other insomnia: Secondary | ICD-10-CM

## 2020-06-20 DIAGNOSIS — Z8739 Personal history of other diseases of the musculoskeletal system and connective tissue: Secondary | ICD-10-CM | POA: Insufficient documentation

## 2020-06-20 LAB — POC URINALSYSI DIPSTICK (AUTOMATED)
Bilirubin, UA: NEGATIVE
Blood, UA: NEGATIVE
Glucose, UA: NEGATIVE
Ketones, UA: NEGATIVE
Leukocytes, UA: NEGATIVE
Nitrite, UA: NEGATIVE
Protein, UA: POSITIVE — AB
Spec Grav, UA: 1.02 (ref 1.010–1.025)
Urobilinogen, UA: 0.2 E.U./dL
pH, UA: 6 (ref 5.0–8.0)

## 2020-06-20 LAB — COMPREHENSIVE METABOLIC PANEL
ALT: 8 U/L (ref 0–35)
AST: 15 U/L (ref 0–37)
Albumin: 4.5 g/dL (ref 3.5–5.2)
Alkaline Phosphatase: 145 U/L — ABNORMAL HIGH (ref 39–117)
BUN: 13 mg/dL (ref 6–23)
CO2: 30 mEq/L (ref 19–32)
Calcium: 9.1 mg/dL (ref 8.4–10.5)
Chloride: 104 mEq/L (ref 96–112)
Creatinine, Ser: 1.09 mg/dL (ref 0.40–1.20)
GFR: 53.09 mL/min — ABNORMAL LOW (ref >60.00)
Glucose, Bld: 78 mg/dL (ref 70–99)
Potassium: 3.8 mEq/L (ref 3.5–5.1)
Sodium: 142 mEq/L (ref 135–145)
Total Bilirubin: 0.3 mg/dL (ref 0.2–1.2)
Total Protein: 7.6 g/dL (ref 6.0–8.3)

## 2020-06-20 LAB — CBC WITH DIFFERENTIAL/PLATELET
Basophils Absolute: 0 10*3/uL (ref 0.0–0.1)
Basophils Relative: 0.6 % (ref 0.0–3.0)
Eosinophils Absolute: 0.2 10*3/uL (ref 0.0–0.7)
Eosinophils Relative: 2.4 % (ref 0.0–5.0)
HCT: 39.5 % (ref 36.0–46.0)
Hemoglobin: 13.1 g/dL (ref 12.0–15.0)
Lymphocytes Relative: 29.1 % (ref 12.0–46.0)
Lymphs Abs: 2.4 10*3/uL (ref 0.7–4.0)
MCHC: 33.3 g/dL (ref 30.0–36.0)
MCV: 91.8 fl (ref 78.0–100.0)
Monocytes Absolute: 0.5 10*3/uL (ref 0.1–1.0)
Monocytes Relative: 6.2 % (ref 3.0–12.0)
Neutro Abs: 5.1 10*3/uL (ref 1.4–7.7)
Neutrophils Relative %: 61.7 % (ref 43.0–77.0)
Platelets: 235 10*3/uL (ref 150.0–400.0)
RBC: 4.31 Mil/uL (ref 3.87–5.11)
RDW: 15 % (ref 11.5–15.5)
WBC: 8.3 10*3/uL (ref 4.0–10.5)

## 2020-06-20 LAB — HEMOGLOBIN A1C: Hgb A1c MFr Bld: 6.6 % — ABNORMAL HIGH (ref 4.6–6.5)

## 2020-06-20 LAB — SEDIMENTATION RATE: Sed Rate: 29 mm/hr (ref 0–30)

## 2020-06-20 LAB — TSH: TSH: 3.65 u[IU]/mL (ref 0.35–4.50)

## 2020-06-20 MED ORDER — ZOLPIDEM TARTRATE 5 MG PO TABS: 5.0000 mg | ORAL_TABLET | Freq: Every evening | ORAL | 0 refills | Status: DC | PRN

## 2020-06-20 MED ORDER — PREDNISONE 10 MG PO TABS: ORAL_TABLET | ORAL | 0 refills | Status: DC

## 2020-06-20 MED ORDER — HYDRALAZINE HCL 25 MG PO TABS: 25.0000 mg | ORAL_TABLET | Freq: Three times a day (TID) | ORAL | 1 refills | Status: DC

## 2020-06-20 NOTE — Assessment & Plan Note (Signed)
Chronic Not controlled seroquel 100 mg nightly not effective - will d/c Has been on ambien and that worked well w/o side effects.  Ideally I would like to avoid this, but given the 5 bendaryl she is taking with all her other medication I think this is better than that Limit to 5 mg at night of [place]

## 2020-06-20 NOTE — Assessment & Plan Note (Signed)
Chronic cmp today Will try to avoid hctz/lasix due to dec GFR - edema is mild Elevate legs, stressed low sodium diet Stressed good BP control and sugar control

## 2020-06-20 NOTE — Assessment & Plan Note (Signed)
Chronic BP not controlled Continue lotrel 10-40 mg daily and add hydralazine 25 mg TID cmp

## 2020-06-20 NOTE — Assessment & Plan Note (Signed)
Chronic Diet controlled Check a1c Low sugar / carb diet Stressed regular exercise

## 2020-06-20 NOTE — Assessment & Plan Note (Signed)
Chronic Continue atorvastatin Regular exercise and healthy diet encouraged

## 2020-06-20 NOTE — Assessment & Plan Note (Signed)
Acute Pain, redness, warmth in R first toe Esr, cmp Prednisone taper  Will check uric acid level at f/u

## 2020-06-20 NOTE — Assessment & Plan Note (Signed)
Chronic  Clinically euthyroid Currently taking levothyroxine 88 mcg daily Check tsh  Titrate med dose if needed  

## 2020-06-21 ENCOUNTER — Telehealth: Payer: Self-pay | Admitting: Internal Medicine

## 2020-06-21 ENCOUNTER — Other Ambulatory Visit: Payer: Self-pay | Admitting: Internal Medicine

## 2020-06-21 LAB — URINE CULTURE: Result:: NO GROWTH

## 2020-06-21 MED ORDER — ZOLPIDEM TARTRATE ER 6.25 MG PO TBCR: 6.2500 mg | EXTENDED_RELEASE_TABLET | Freq: Every evening | ORAL | 0 refills | Status: DC | PRN

## 2020-06-21 NOTE — Telephone Encounter (Signed)
Patient states the Ambien helped her fall asleep fast. But she was wake within 3 hours. She wants to know if she can be on something strong or if it can be an XR.   Please follow up with patient.

## 2020-06-21 NOTE — Telephone Encounter (Signed)
We can try the extended release ambien - sent to pharmacy.

## 2020-06-21 NOTE — Telephone Encounter (Signed)
LVM to inform patient.  

## 2020-06-26 ENCOUNTER — Other Ambulatory Visit: Payer: Self-pay | Admitting: Internal Medicine

## 2020-06-26 MED ORDER — VALACYCLOVIR HCL 500 MG PO TABS
500.0000 mg | ORAL_TABLET | Freq: Every day | ORAL | 1 refills | Status: DC
Start: 2020-06-26 — End: 2020-12-21

## 2020-06-26 NOTE — Telephone Encounter (Signed)
valACYclovir (VALTREX) 500 MG tablet Requesting a refill [place], [place] Phone:  [phone]  Fax:  [phone]     Patient also wants someone to reach out to her about her lab results Patient # [phone]

## 2020-06-26 NOTE — Telephone Encounter (Signed)
Reviewed chart pt is up-to-date sent refills to [facility]. See result note for labs.Marland KitchenJohny Chess

## 2020-06-26 NOTE — Telephone Encounter (Signed)
Patient states the new medication she was sent her insurance wont cover it

## 2020-06-27 NOTE — Telephone Encounter (Signed)
I do not have any other options - she has tried everything else.

## 2020-06-28 ENCOUNTER — Telehealth: Payer: Self-pay | Admitting: Internal Medicine

## 2020-06-28 DIAGNOSIS — G4709 Other insomnia: Secondary | ICD-10-CM

## 2020-06-28 MED ORDER — QUETIAPINE FUMARATE 100 MG PO TABS
100.0000 mg | ORAL_TABLET | Freq: Every day | ORAL | 1 refills | Status: DC
Start: 2020-06-28 — End: 2020-07-05

## 2020-06-28 NOTE — Telephone Encounter (Signed)
Patient called back today and requested to be put back on Seroquel. Waiting for Dr. Quay Burow response.

## 2020-06-28 NOTE — Telephone Encounter (Signed)
Left message for patient and my-chart message sent regarding refill being sent in.

## 2020-06-28 NOTE — Telephone Encounter (Signed)
seroquel sent to [facility]

## 2020-06-28 NOTE — Telephone Encounter (Signed)
zolpidem (AMBIEN CR) 6.25 MG CR tablet Patient stating the Lorrin Mais is still not helping and was wondering if she could just go back on the seroquel if there was no other options  [place], [place], [place] Phone # [phone]

## 2020-06-29 ENCOUNTER — Other Ambulatory Visit: Payer: Self-pay | Admitting: Internal Medicine

## 2020-06-30 DIAGNOSIS — I10 Essential (primary) hypertension: Secondary | ICD-10-CM | POA: Diagnosis not present

## 2020-06-30 DIAGNOSIS — M545 Low back pain, unspecified: Secondary | ICD-10-CM | POA: Diagnosis not present

## 2020-06-30 DIAGNOSIS — Z79899 Other long term (current) drug therapy: Secondary | ICD-10-CM | POA: Diagnosis not present

## 2020-06-30 DIAGNOSIS — K219 Gastro-esophageal reflux disease without esophagitis: Secondary | ICD-10-CM | POA: Diagnosis not present

## 2020-07-04 ENCOUNTER — Encounter: Payer: Self-pay | Admitting: Internal Medicine

## 2020-07-05 MED ORDER — QUETIAPINE FUMARATE 100 MG PO TABS
150.0000 mg | ORAL_TABLET | Freq: Every day | ORAL | 1 refills | Status: DC
Start: 2020-07-05 — End: 2021-02-07

## 2020-07-10 DIAGNOSIS — G8929 Other chronic pain: Secondary | ICD-10-CM | POA: Diagnosis not present

## 2020-07-10 DIAGNOSIS — K6289 Other specified diseases of anus and rectum: Secondary | ICD-10-CM | POA: Diagnosis not present

## 2020-07-11 ENCOUNTER — Telehealth: Payer: Self-pay | Admitting: Nurse Practitioner

## 2020-07-11 NOTE — Telephone Encounter (Signed)
Pt is requesting a call back from a nurse. Pt saw [employer]  and states she does not have anal fissures which is why she was being treated by [place] for. Pt is requesting a call back to advise what she could do.

## 2020-07-11 NOTE — Telephone Encounter (Signed)
Called back. No answer. Left message of my returned call. Patient last seen 12/25/16 at which time it was decided the rectal pain was not a GI issue and she was sent to Ortho.

## 2020-07-13 ENCOUNTER — Telehealth: Payer: Self-pay

## 2020-07-13 NOTE — Telephone Encounter (Signed)
[id number] - PA Case ID: [id number] - Rx #: [id number]

## 2020-07-17 ENCOUNTER — Other Ambulatory Visit: Payer: Self-pay | Admitting: Internal Medicine

## 2020-07-19 ENCOUNTER — Ambulatory Visit: Payer: Medicare Other | Admitting: Orthopaedic Surgery

## 2020-07-24 ENCOUNTER — Other Ambulatory Visit: Payer: Self-pay | Admitting: Internal Medicine

## 2020-07-25 ENCOUNTER — Other Ambulatory Visit: Payer: Self-pay | Admitting: Internal Medicine

## 2020-07-25 ENCOUNTER — Ambulatory Visit: Payer: Medicare Other | Admitting: Internal Medicine

## 2020-07-26 ENCOUNTER — Ambulatory Visit: Payer: Medicare Other | Admitting: Orthopaedic Surgery

## 2020-07-28 DIAGNOSIS — M545 Low back pain, unspecified: Secondary | ICD-10-CM | POA: Diagnosis not present

## 2020-07-28 DIAGNOSIS — G8929 Other chronic pain: Secondary | ICD-10-CM | POA: Diagnosis not present

## 2020-07-28 DIAGNOSIS — M546 Pain in thoracic spine: Secondary | ICD-10-CM | POA: Diagnosis not present

## 2020-07-28 DIAGNOSIS — Z79899 Other long term (current) drug therapy: Secondary | ICD-10-CM | POA: Diagnosis not present

## 2020-08-07 ENCOUNTER — Telehealth: Payer: Self-pay | Admitting: Pharmacist

## 2020-08-07 NOTE — Progress Notes (Signed)
Chronic Care Management Pharmacy Assistant   Name: Jill Phillips  MRN: [id number] DOB: [birth year]  Reason for Encounter: General Adherence Call   PCP : [name], MD  Allergies:   Allergies  Allergen Reactions  . Flagyl [Metronidazole] Diarrhea  . Limonene Itching    Yeast infection in mouth  . Sulfa Antibiotics Other (See Comments)    Yeast infection in mouth  . Doxycycline     Mouth soreness - reaction vs thrush?  [name] Amoxicillin Other (See Comments)    REACTION: Oral yeast infection Has patient had a PCN reaction causing immediate rash, facial/tongue/throat swelling, SOB or lightheadedness with hypotension: No Has patient had a PCN reaction causing severe rash involving mucus membranes or skin necrosis: No Has patient had a PCN reaction that required hospitalization No Has patient had a PCN reaction occurring within the last 10 years: No If all of the above answers are "NO", then may proceed with Cephalosporin use.   . Chlorzoxazone Other (See Comments)    headache  . Codeine Other (See Comments)    headache  . Darvocet [Propoxyphene N-Acetaminophen] Itching  . Dilaudid [Hydromorphone Hcl] Itching  . Keflex [Cephalexin] Other (See Comments)    Pt does not recall reaction (maybe yeast infection)  . Morphine And Related Itching  . Nitrofurantoin Monohyd Macro Hives    Reaction to [employer]  . Percocet [Oxycodone-Acetaminophen] Itching    Patient can tolerate Acetaminophen solely  . Trazodone And Nefazodone Palpitations    Medications: Outpatient Encounter Medications as of 08/07/2020  Medication Sig  . gabapentin (NEURONTIN) 400 MG capsule TAKE 1 CAPSULE BY MOUTH 3 TIMES DAILY.  [name] acetaminophen (TYLENOL) 500 MG tablet Take 1,000 mg by mouth every 6 (six) hours as needed (for headaches.).  [name] albuterol (PROAIR HFA) 108 (90 Base) MCG/ACT inhaler INHALE 1 PUFF INTO THE LUNGS EVERY 6 HOURS AS NEEDED FOR WHEEZING OR SHORTNESS OF BREATH.NEED OFFICE VISIT FOR FURTHER  REFILLS (Patient taking differently: Inhale 1 puff into the lungs every 6 (six) hours as needed for wheezing or shortness of breath. )  . amLODipine-benazepril (LOTREL) 10-40 MG capsule TAKE 1 CAPSULE BY MOUTH DAILY.  [name] atorvastatin (LIPITOR) 20 MG tablet TAKE 1 TABLET BY MOUTH DAILY.  . cimetidine (TAGAMET) 400 MG tablet Take 400 mg by mouth 2 (two) times daily.  . clonazePAM (KLONOPIN) 1 MG tablet TAKE 1 TABLET BY MOUTH 2 TIMES DAILY AS NEEDED FOR ANXIETY  . docusate sodium (COLACE) 100 MG capsule Take 200 mg by mouth at bedtime.  . fentaNYL 37.5 MCG/HR PT72 Apply 1 patch topically every 3 (three) days.  . hydrALAZINE (APRESOLINE) 25 MG tablet Take 1 tablet (25 mg total) by mouth 3 (three) times daily.  [name] HYDROcodone-acetaminophen (NORCO) 10-325 MG tablet Take 1 tablet by mouth every 6 (six) hours as needed for severe pain (Chronic back pain).  [name] levothyroxine (SYNTHROID) 88 MCG tablet TAKE 1 TABLET BY MOUTH DAILY.  [name] Methen-Hyosc-Meth Blue-Na Phos (ME/NAPHOS/MB/HYO1) 81.6 MG TABS Take 1 tablet by mouth 3 (three) times daily.  [name] NARCAN 4 MG/0.1ML LIQD nasal spray kit Place 1 spray into the nose as needed (accidental overdose).  . nicotine (NICODERM CQ - DOSED IN MG/24 HOURS) 21 mg/24hr patch Place 1 patch (21 mg total) onto the skin daily.  [name] omeprazole (PRILOSEC) 40 MG capsule Take 40 mg by mouth 2 (two) times daily.  . QUEtiapine (SEROQUEL) 100 MG tablet Take 1.5 tablets (150 mg total) by mouth at bedtime.  [name] rOPINIRole (  REQUIP) 1 MG tablet SMARTSIG:1 Tablet(s) By Mouth Every Evening  . Sennosides (SENNA LAX PO) Take 2 tablets by mouth at bedtime.   [name] SPIRIVA HANDIHALER 18 MCG inhalation capsule PLACE 1 CAPSULE INTO INHALER AND INHALE DAILY.  [name] tiZANidine (ZANAFLEX) 4 MG tablet TAKE 1 TABLET BY MOUTH EVERY 8 HOURS AS NEEDED  . valACYclovir (VALTREX) 500 MG tablet Take 1 tablet (500 mg total) by mouth daily.  [name] venlafaxine XR (EFFEXOR-XR) 150 MG 24 hr capsule TAKE 2 CAPSULES BY MOUTH DAILY.    No facility-administered encounter medications on file as of 08/07/2020.    Current Diagnosis: Patient Active Problem List   Diagnosis Date Noted  . Acute gout 06/20/2020  . Interstitial cystitis 01/21/2020  . Status post shoulder surgery 09/09/2019  . Nausea 08/19/2019  . Anxiety 05/04/2019  . Insomnia 05/04/2019  . Syncope 12/08/2018  . Disorder of rotator cuff syndrome of left shoulder and allied disorder 06/22/2018  . Bilateral leg edema 06/04/2018  . Type 2 diabetes mellitus without complication, without long-term current use of insulin ([place]) 12/02/2017  . Sacral pain 09/17/2017  . COPD (chronic obstructive pulmonary disease) ([place]) 04/21/2017  . Lower back pain 03/03/2017  . CKD (chronic kidney disease) stage 3, GFR 30-59 ml/min (HCC) 08/07/2016  . GERD (gastroesophageal reflux disease) 08/07/2016  . Chronic respiratory failure ([place]) 07/29/2016  . Arthritis of carpometacarpal [facility]) joint of left thumb 07/09/2016  . Pneumothorax on left 01/31/2016  . Chronic, continuous use of opioids 09/06/2015  . Degenerative disc disease, lumbar 08/01/2015    Class: Chronic  . Spondylolisthesis of lumbar region 08/01/2015    Class: Chronic  . Urinary, incontinence, stress female 05/06/2014  . Constipation 05/05/2014  . 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 HTN (hypertension) 05/19/2013  . HLD (hyperlipidemia) 05/19/2013  . Depression 05/19/2013  . Hypothyroidism 05/19/2013  . Tobacco abuse   . Osteoarthritis of both knees 11/18/2011  . RLS (restless legs syndrome) 11/18/2011    Goals Addressed   None     Follow-Up:  Pharmacist Review   Called and spoke with patient to ask if she is having any health issues since the last time she seen clinical pharmacist Mendel Ryder. The patient states that she has not been feeling good for the last 2 weeks, she is not sure what is going on. The patient did states that she has been going through a lot with her daughter and some of the issues may stem from  that. She also states that she is having some problems with her feet hurting. The patient states that she still is a smoker. Also one of the things she want to do is lose weight she eats to much and can't seem to lose weight. The patient did say that she is sleeping well since her seroquel has been increased. I let the patient know that I will pass along the information to the clinical pharmacist Mendel Ryder.         Wendy Poet, Clinical Pharmacist Assistant [facility]

## 2020-08-08 ENCOUNTER — Other Ambulatory Visit: Payer: Self-pay | Admitting: Internal Medicine

## 2020-08-11 ENCOUNTER — Ambulatory Visit: Payer: Self-pay

## 2020-08-11 ENCOUNTER — Encounter: Payer: Self-pay | Admitting: Specialist

## 2020-08-11 ENCOUNTER — Other Ambulatory Visit: Payer: Self-pay

## 2020-08-11 ENCOUNTER — Ambulatory Visit (INDEPENDENT_AMBULATORY_CARE_PROVIDER_SITE_OTHER): Payer: Medicare Other | Admitting: Specialist

## 2020-08-11 VITALS — BP 156/91 | HR 103 | Ht 64.0 in | Wt 156.0 lb

## 2020-08-11 DIAGNOSIS — R102 Pelvic and perineal pain: Secondary | ICD-10-CM

## 2020-08-11 DIAGNOSIS — M4325 Fusion of spine, thoracolumbar region: Secondary | ICD-10-CM

## 2020-08-11 DIAGNOSIS — M5134 Other intervertebral disc degeneration, thoracic region: Secondary | ICD-10-CM

## 2020-08-11 NOTE — Patient Instructions (Signed)
Avoid frequent bending and stooping  No lifting greater than 10 lbs. May use ice or moist heat for pain. Weight loss is of benefit. Best medication for lumbar disc disease is arthritis medications like motrin, celebrex and naprosyn. Exercise is important to improve your indurance and does allow people to function better inspite of back pain.  MRI of pelvis and sacrum with and without contrast "rectal pain" Referral to Dr. Kendell Bane, Pelvic floor pain, rectal pain.

## 2020-08-11 NOTE — Progress Notes (Signed)
Office Visit Note   Patient: Jill Phillips           Date of Birth: 1956/07/29           MRN: [id number] Visit Date: 08/11/2020              Requested by: [name], MD [place],  [place] [address] PCP: [name], MD   Assessment & Plan: Visit Diagnoses:  1. Degenerative disc disease, thoracic   2. Fusion of spine of thoracolumbar region   3. Pelvic pain     Plan: Avoid frequent bending and stooping  No lifting greater than 10 lbs. May use ice or moist heat for pain. Weight loss is of benefit. Best medication for lumbar disc disease is arthritis medications like motrin, celebrex and naprosyn. Exercise is important to improve your indurance and does allow people to function better inspite of back pain.  MRI of pelvis and sacrum with and without contrast "rectal pain" Referral to Dr. Kendell Bane, Pelvic floor pain, rectal pain.  Follow-Up Instructions: Return in about 4 weeks (around 09/08/2020).   Orders:  Orders Placed This Encounter  Procedures  . XR Thoracic Spine 2 View  . Ambulatory referral to Urology   No orders of the defined types were placed in this encounter.     Procedures: No procedures performed   Clinical Data: No additional findings.   Subjective: Chief Complaint  Patient presents with  . Lower Back - Follow-up, Pain  . Pelvis - Follow-up, Pain    HPI  Review of Systems   Objective: Vital Signs: BP (!) 156/91 (BP Location: Left Arm, Patient Position: Sitting)   Pulse (!) 103   Ht 5\' 4"  (1.626 m)   Wt 156 lb (70.8 kg)   BMI 26.78 kg/m   Physical Exam  Ortho Exam  Specialty Comments:  No specialty comments available.  Imaging: XR Thoracic Spine 2 View  Result Date: 08/11/2020 AP and lateral flexion and extension radiographs of the upper lumbar and thoracic spine demonstrates pedicle screws and rods from T10 to the sacrum. The hardware is intact with no sign of failure. With flexion and extension  there is 58mm of differnence measured from T10 to the superior end plate of L2 this suggests a solid fusion.     PMFS History: Patient Active Problem List   Diagnosis Date Noted  . Degenerative disc disease, lumbar 08/01/2015    Priority: High    Class: Chronic  . Spondylolisthesis of lumbar region 08/01/2015    Priority: High    Class: Chronic  . Acute gout 06/20/2020  . Interstitial cystitis 01/21/2020  . Status post shoulder surgery 09/09/2019  . Nausea 08/19/2019  . Anxiety 05/04/2019  . Insomnia 05/04/2019  . Syncope 12/08/2018  . Disorder of rotator cuff syndrome of left shoulder and allied disorder 06/22/2018  . Bilateral leg edema 06/04/2018  . Type 2 diabetes mellitus without complication, without long-term current use of insulin ([place]) 12/02/2017  . Sacral pain 09/17/2017  . COPD (chronic obstructive pulmonary disease) ([place]) 04/21/2017  . Lower back pain 03/03/2017  . CKD (chronic kidney disease) stage 3, GFR 30-59 ml/min (HCC) 08/07/2016  . GERD (gastroesophageal reflux disease) 08/07/2016  . Chronic respiratory failure ([place]) 07/29/2016  . Arthritis of carpometacarpal [facility]) joint of left thumb 07/09/2016  . Pneumothorax on left 01/31/2016  . Chronic, continuous use of opioids 09/06/2015  . Urinary, incontinence, stress female 05/06/2014  . Constipation 05/05/2014  . HSV  infection 07/14/2013  . HTN (hypertension) 05/19/2013  . HLD (hyperlipidemia) 05/19/2013  . Depression 05/19/2013  . Hypothyroidism 05/19/2013  . Tobacco abuse   . Osteoarthritis of both knees 11/18/2011  . RLS (restless legs syndrome) 11/18/2011   Past Medical History:  Diagnosis Date  . Active smoker   . Anxiety   . Calcifying tendinitis of shoulder   . Chronic back pain   . Chronic pain syndrome   . COPD (chronic obstructive pulmonary disease) ([place])   . Dysthymic disorder   . Emphysema lung ([place])   . GERD (gastroesophageal reflux disease)   . Headache    "weekly maybe" (01/31/2016)  .  Heart murmur    for years, nothing to be concerned about  . Herpes genitalia   . History of blood transfusion 1980   related to "back surgery"  . Hyperlipidemia   . Hypertension   . Hypothyroidism   . Lumbago   . Osteoarthrosis, unspecified whether generalized or localized, lower leg   . Pain in joint, upper arm   . Pneumothorax, left 01/31/2016   S/P Left posterior subcostal pain injection on 01/30/2016  . PONV (postoperative nausea and vomiting)    gets nauseous  with longer surgery. Difficuty voiding after surgery  . Postlaminectomy syndrome, thoracic region   . Primary localized osteoarthrosis, lower leg   . Restless leg syndrome   . Sleep apnea    s/p surgery- last sleep study 2011- doesnt use oxygen or machine at night as instructed,.   12/2014- Dr Halford Chessman  reports it is negative.  . Thyroid disease     Family History  Problem Relation Age of Onset  . Kidney disease Mother   . Heart disease Father   . Anuerysm Brother 29       brain  . Heart disease Brother   . Heart disease Sister 50       s/p CABG  . Hypertension Sister   . Colon cancer Neg Hx     Past Surgical History:  Procedure Laterality Date  . APPENDECTOMY    . BACK SURGERY     18 back surgeries (2 thoracic & 16 lumbar) (01/31/2016)  . DILATION AND CURETTAGE OF UTERUS    . HAMMER TOE SURGERY    . IR RADIOLOGIST EVAL & MGMT  07/21/2017  . JOINT REPLACEMENT    . KNEE ARTHROSCOPY Right   . LAPAROSCOPIC CHOLECYSTECTOMY    . LUMBAR FUSION N/A 08/01/2015   Procedure: Right sided L1-2 and L2-3 transforaminal lumbar interbody fusion with cages, Extension of posterior fusion T12 to L3, Replaced pedicle screws bilaterally L1-L2 , Replaced left sided pedicle screws L-3. Instrumentation T12 to L3 using local bone graft, Vivigen allograft and cancellous chips;  Surgeon: [name], MD;  Location: [facility];  Service: Orthopedics;  Laterality: N/A;  . LUMBAR LAMINECTOMY/DECOMPRESSION MICRODISCECTOMY N/A 01/28/2014   Procedure:  Minimally Invasive Right  L1-2 Microdiscectomy;  Surgeon: [name], MD;  Location: [place];  Service: Orthopedics;  Laterality: N/A;  . REVERSE SHOULDER ARTHROPLASTY Left 09/09/2019   Procedure: LEFT REVERSE SHOULDER REPLACEMENT;  Surgeon: Meredith Pel, MD;  Location: [place];  Service: Orthopedics;  Laterality: Left;  . TOTAL HIP ARTHROPLASTY Right   . TOTAL KNEE ARTHROPLASTY  05/29/2012   Procedure: TOTAL KNEE ARTHROPLASTY;  Surgeon: Mcarthur Rossetti, MD;  Location: [facility];  Service: Orthopedics;  Laterality: Right;  Right Total Knee Arthroplasty  . TUBAL LIGATION    . UVULOPALATOPHARYNGOPLASTY    . VAGINAL  HYSTERECTOMY     Social History   Occupational History  . Occupation: disability    Comment: back surgeries  Tobacco Use  . Smoking status: Current Every Day Smoker    Packs/day: 1.25    Years: 45.00    Pack years: 56.25    Types: Cigarettes  . Smokeless tobacco: Never Used  . Tobacco comment: form given 12/25/16  Vaping Use  . Vaping Use: Never used  Substance and Sexual Activity  . Alcohol use: No    Alcohol/week: 0.0 standard drinks  . Drug use: No    Types: Marijuana    Comment: 01/31/2016 "none since ~ 1980"  . Sexual activity: Never    Partners: Male

## 2020-08-15 ENCOUNTER — Other Ambulatory Visit: Payer: Self-pay | Admitting: Internal Medicine

## 2020-08-15 ENCOUNTER — Other Ambulatory Visit: Payer: Self-pay | Admitting: Specialist

## 2020-08-16 ENCOUNTER — Telehealth: Payer: Self-pay | Admitting: *Deleted

## 2020-08-16 NOTE — Telephone Encounter (Signed)
Left vm for pt to return my call, provider placed order for urology to Kendell Bane, need to know if pt is familiar with this specialist or has been to him before. Can not find this provider.

## 2020-08-18 ENCOUNTER — Other Ambulatory Visit: Payer: Self-pay | Admitting: Specialist

## 2020-08-18 ENCOUNTER — Other Ambulatory Visit: Payer: Self-pay | Admitting: Internal Medicine

## 2020-08-21 ENCOUNTER — Other Ambulatory Visit: Payer: Self-pay | Admitting: Internal Medicine

## 2020-08-24 ENCOUNTER — Encounter: Payer: Self-pay | Admitting: Family

## 2020-08-24 ENCOUNTER — Encounter: Payer: Self-pay | Admitting: Internal Medicine

## 2020-08-24 ENCOUNTER — Telehealth (INDEPENDENT_AMBULATORY_CARE_PROVIDER_SITE_OTHER): Payer: Medicare Other | Admitting: Family

## 2020-08-24 ENCOUNTER — Other Ambulatory Visit: Payer: Self-pay

## 2020-08-24 VITALS — Ht 64.0 in

## 2020-08-24 DIAGNOSIS — K219 Gastro-esophageal reflux disease without esophagitis: Secondary | ICD-10-CM | POA: Diagnosis not present

## 2020-08-24 DIAGNOSIS — R1013 Epigastric pain: Secondary | ICD-10-CM | POA: Diagnosis not present

## 2020-08-24 NOTE — Telephone Encounter (Signed)
No note needed 

## 2020-08-24 NOTE — Progress Notes (Signed)
Virtual Visit via Telephone Note  I connected with Jill Phillips on 08/24/20 at 10:20 AM EST by telephone and verified that I am speaking with the correct person using two identifiers.  Location: Patient: Jill Phillips Provider: Dutch Quint, NP   I discussed the limitations, risks, security and privacy concerns of performing an evaluation and management service by telephone and the availability of in person appointments. I also discussed with the patient that there may be a patient responsible charge related to this service. The patient expressed understanding and agreed to proceed.   History of Present Illness: 64 year old female is being seen virtually with concerns of pain to the epigastric area, an episode of vomiting after eating spaghetti. She currently takes Tagamet twice a day and Omeprazole once a day. Pain is 7/10.  Last visit with GI was approx 3 years ago. Also plans to see urology next week for interstitial cystitis    Observations/Objective: A&O, NAD   Assessment and Plan: Jill Phillips was seen today for gi problem.  Diagnoses and all orders for this visit:  Epigastric pain -     Ambulatory referral to Gastroenterology  Gastroesophageal reflux disease without esophagitis -     Ambulatory referral to Gastroenterology  Increase Omeprazole to 40 mg twice a day (as previously prescribed). GERD diet precautions discussed.    Follow Up Instructions: Follow-up with GI    I discussed the assessment and treatment plan with the patient. The patient was provided an opportunity to ask questions and all were answered. The patient agreed with the plan and demonstrated an understanding of the instructions.   The patient was advised to call back or seek an in-person evaluation if the symptoms worsen or if the condition fails to improve as anticipated.  I provided 15 minutes of non-face-to-face time during this encounter.   Kennyth Arnold, FNP

## 2020-08-30 DIAGNOSIS — Z79899 Other long term (current) drug therapy: Secondary | ICD-10-CM | POA: Diagnosis not present

## 2020-08-30 DIAGNOSIS — G2581 Restless legs syndrome: Secondary | ICD-10-CM | POA: Diagnosis not present

## 2020-08-30 DIAGNOSIS — M545 Low back pain, unspecified: Secondary | ICD-10-CM | POA: Diagnosis not present

## 2020-08-30 DIAGNOSIS — Z0000 Encounter for general adult medical examination without abnormal findings: Secondary | ICD-10-CM | POA: Diagnosis not present

## 2020-09-02 DIAGNOSIS — N189 Chronic kidney disease, unspecified: Secondary | ICD-10-CM

## 2020-09-02 HISTORY — DX: Chronic kidney disease, unspecified: N18.9

## 2020-09-04 ENCOUNTER — Telehealth: Payer: Medicare Other

## 2020-09-04 NOTE — Chronic Care Management (AMB) (Deleted)
Chronic Care Management Pharmacy  Name: Jill Phillips  MRN: [id number] DOB: November 01, 1955   Chief Complaint/ HPI  Jill Phillips,  65 y.o. , female presents for their Follow-Up CCM visit with the clinical pharmacist via telephone.  PCP : [name], MD  Their chronic conditions include: HTN, HLD, COPD, T2DM, CKD, GERD, Hypothyroidism, OA knees, DDD, depression/anxiety, insomnia   Office Visits: 06/28/20 pt call - switched zolpidem back to seroquel. Increased to 150 mg at night.  06/20/20 Dr Quay Burow OV: acute gout, rx'd prednisone taper. DC'd seroquel d/t ineffective, rx'd zolpidem 5 mg HS in lieu of 5 Benadryl. Avoiding hctz/lasix due to GFR. Added hydralazine 25 mg TID. A1c increased to 6.6, now diabetic.  11/16/19 Dr Quay Burow OV: recurrent UTI, rx Augmentin and fluconazole. Urine cx came back contaminated, not likely true UTI. Referred to urology for continued bladder pain.  11/08/19 Dr Quay Burow VV: UTI, tx with Cipro and Azo prn.  08/19/19 Dr Jenny Reichmann OV : acute visit, rx'd zofran for nausea.  06/21/19 Dr Quay Burow OV: pain meds per ortho, looking for new pain mgmt. Increased gabapentin to 400 mg TID. Pt wanted to increase seroquel for sleep, MD declined d/t polypharmacy concerns.  Consult Visit: 11/15/19 Dr Marlou Sa (orthopedic surg): bilateral thumb pain d/t arthritis. Normal X-ray of shoulder. Plan for thumb injections at f/u.  10/13/19 Dr Marlou Sa (orthopedic surg): Shoulder surgery 09/09/19.  No issues, RTC 4 wks.  08/31/19 ED visit: shoulder pain.  Allergies  Allergen Reactions  . Flagyl [Metronidazole] Diarrhea  . Limonene Itching    Yeast infection in mouth  . Sulfa Antibiotics Other (See Comments)    Yeast infection in mouth  . Doxycycline     Mouth soreness - reaction vs thrush?  [name] Amoxicillin Other (See Comments)    REACTION: Oral yeast infection Has patient had a PCN reaction causing immediate rash, facial/tongue/throat swelling, SOB or lightheadedness with hypotension: No Has  patient had a PCN reaction causing severe rash involving mucus membranes or skin necrosis: No Has patient had a PCN reaction that required hospitalization No Has patient had a PCN reaction occurring within the last 10 years: No If all of the above answers are "NO", then may proceed with Cephalosporin use.   . Chlorzoxazone Other (See Comments)    headache  . Codeine Other (See Comments)    headache  . Darvocet [Propoxyphene N-Acetaminophen] Itching  . Dilaudid [Hydromorphone Hcl] Itching  . Keflex [Cephalexin] Other (See Comments)    Pt does not recall reaction (maybe yeast infection)  . Morphine And Related Itching  . Nitrofurantoin Monohyd Macro Hives    Reaction to [employer]  . Percocet [Oxycodone-Acetaminophen] Itching    Patient can tolerate Acetaminophen solely  . Trazodone And Nefazodone Palpitations   Medications: Outpatient Encounter Medications as of 09/04/2020  Medication Sig  . acetaminophen (TYLENOL) 500 MG tablet Take 1,000 mg by mouth every 6 (six) hours as needed (for headaches.).  [name] albuterol (PROAIR HFA) 108 (90 Base) MCG/ACT inhaler INHALE 1 PUFF INTO THE LUNGS EVERY 6 HOURS AS NEEDED FOR WHEEZING OR SHORTNESS OF BREATH.NEED OFFICE VISIT FOR FURTHER REFILLS (Patient taking differently: Inhale 1 puff into the lungs every 6 (six) hours as needed for wheezing or shortness of breath.)  . amLODipine-benazepril (LOTREL) 10-40 MG capsule TAKE 1 CAPSULE BY MOUTH DAILY.  [name] atorvastatin (LIPITOR) 20 MG tablet TAKE 1 TABLET BY MOUTH DAILY.  . cimetidine (TAGAMET) 400 MG tablet Take 400 mg by mouth 2 (two) times daily.  [name]  clonazePAM (KLONOPIN) 1 MG tablet TAKE 1 TABLET BY MOUTH 2 TIMES DAILY AS NEEDED FOR ANXIETY  . docusate sodium (COLACE) 100 MG capsule Take 200 mg by mouth at bedtime.  . fentaNYL 37.5 MCG/HR PT72 Apply 1 patch topically every 3 (three) days.  [name] gabapentin (NEURONTIN) 400 MG capsule TAKE 1 CAPSULE BY MOUTH 3 TIMES DAILY.  . hydrALAZINE (APRESOLINE) 25 MG tablet  Take 1 tablet (25 mg total) by mouth 3 (three) times daily.  [name] HYDROcodone-acetaminophen (NORCO) 10-325 MG tablet Take 1 tablet by mouth every 6 (six) hours as needed for severe pain (Chronic back pain).  [name] levothyroxine (SYNTHROID) 88 MCG tablet TAKE 1 TABLET BY MOUTH DAILY.  [name] Methen-Hyosc-Meth Blue-Na Phos (ME/NAPHOS/MB/HYO1) 81.6 MG TABS Take 1 tablet by mouth 3 (three) times daily. (Patient not taking: Reported on 08/24/2020)  . NARCAN 4 MG/0.1ML LIQD nasal spray kit Place 1 spray into the nose as needed (accidental overdose). (Patient not taking: Reported on 08/24/2020)  . nicotine (NICODERM CQ - DOSED IN MG/24 HOURS) 21 mg/24hr patch Place 1 patch (21 mg total) onto the skin daily. (Patient not taking: Reported on 08/24/2020)  . omeprazole (PRILOSEC) 40 MG capsule TAKE 1 CAPSULE BY MOUTH 2 TIMES DAILY BEFORE A MEAL.  [name] QUEtiapine (SEROQUEL) 100 MG tablet Take 1.5 tablets (150 mg total) by mouth at bedtime.  [name] rOPINIRole (REQUIP) 1 MG tablet SMARTSIG:1 Tablet(s) By Mouth Every Evening (Patient not taking: Reported on 08/24/2020)  . Sennosides (SENNA LAX PO) Take 2 tablets by mouth at bedtime.  (Patient not taking: Reported on 08/24/2020)  . SPIRIVA HANDIHALER 18 MCG inhalation capsule PLACE 1 CAPSULE INTO INHALER AND INHALE DAILY.  [name] tiZANidine (ZANAFLEX) 4 MG tablet TAKE 1 TABLET BY MOUTH EVERY 8 HOURS AS NEEDED  . valACYclovir (VALTREX) 500 MG tablet Take 1 tablet (500 mg total) by mouth daily.  [name] venlafaxine XR (EFFEXOR-XR) 150 MG 24 hr capsule TAKE 2 CAPSULES BY MOUTH DAILY.   No facility-administered encounter medications on file as of 09/04/2020.   Wt Readings from Last 3 Encounters:  08/11/20 156 lb (70.8 kg)  06/20/20 156 lb (70.8 kg)  04/04/20 152 lb (68.9 kg)   Lab Results  Component Value Date   CREATININE 1.09 06/20/2020   BUN 13 06/20/2020   GFR 53.09 (L) 06/20/2020   GFRNONAA 51 (L) 09/07/2019   GFRAA 59 (L) 09/07/2019   NA 142 06/20/2020   K 3.8 06/20/2020   CALCIUM  9.1 06/20/2020   CO2 30 06/20/2020   Current Diagnosis/Assessment:    Goals Addressed   None    COPD   Last spirometry score: FEV1 88% predicted, FEV/FVC 0.86  Patient has failed these meds in past: n/a Patient is currently controlled on the following medications:   albuterol MDI prn  Spiriva 1 puff daily  Using maintenance inhaler regularly? Yes Frequency of rescue inhaler use:  1-2x per week  We discussed:  proper inhaler technique, Last visit we discussed importance of daily use of Spiriva to prevent wheezing/SOB, as patient had not been taking it. Pt reports she has been compliant with daily Spiriva and has noticed improvement in breathing/wheezing. She reports Dr Halford Chessman (pulmonary) initially prescribed this, she has not followed up with him in over 2 years so wonders if PCP can refill this for her.   Plan  Continue current medications   Tobacco Abuse   Tobacco Status:  Social History   Tobacco Use  Smoking Status Current Every Day Smoker  . Packs/day: 1.25  .  Years: 45.00  . Pack years: 56.25  . Types: Cigarettes  Smokeless Tobacco Never Used  Tobacco Comment   form given 12/25/16   Patient smokes After 30 minutes of waking  Patient has failed these meds in past: nicotine patches, gum  We discussed: pt wants to quit smoking and is ready to try, asked about Chantix, which would be an option. Pt does have significant history of depression/anxiety, so will need to monitor closely for behavioral or psychiatric changes. Notably, recent post-marketing studies have shown no difference in neuropsychiatric effects of Chantix vs placebo or NRT.   Of note patient is in process of rescheduling appt with PCP for labs and to discuss Chantix.  Plan  Recommend to start Chantix for smoking cessation  Monitor closely for behavioral/psychiatric changes   Diabetes   A1c goal < 7%  Recent Relevant Labs: Lab Results  Component Value Date/Time   HGBA1C 6.6 (H)  06/20/2020 12:15 PM   HGBA1C 6.2 01/21/2020 03:02 PM    Last diabetic eye exam:  Lab Results  Component Value Date/Time   HMDIABEYEEXA No Retinopathy 12/09/2017 12:31 PM   No medications indicated.  We discussed: what prediabetes means, what A1c means, how to prevent progression to DM. Discussed which foods are high in carbs and sugar that contribute to DM.  Plan  Continue control with diet and exercise    Hypertension   Office blood pressures are  BP Readings from Last 3 Encounters:  08/11/20 (!) 156/91  06/20/20 (!) 166/86  04/04/20 (!) 148/84   Patient is currently controlled on the following medications:   amlodipine-benazepril 10-40 mg daily  Patient checks BP at home infrequently  Patient home BP readings are ranging: ~130/80  We discussed BP goals, importance of controlling BP for heart disease prevention. Last visit, combined amlodipine and benazepril into one tablet, pt reports she is compliant with new dose and stopped taking separate pills.  Plan  Continue current medications and control with diet and exercise    Hyperlipidemia   LDL goal< 100  Last lipids Lab Results  Component Value Date   CHOL 209 (H) 01/21/2020   HDL 44.00 01/21/2020   LDLCALC 86 11/03/2018   LDLDIRECT 124.0 01/21/2020   TRIG 219.0 (H) 01/21/2020   CHOLHDL 5 01/21/2020   Hepatic Function Latest Ref Rng & Units 06/20/2020 01/21/2020 12/03/2018  Total Protein 6.0 - 8.3 g/dL 7.6 7.9 8.0  Albumin 3.5 - 5.2 g/dL 4.5 4.6 4.5  AST 0 - 37 U/L 15 17 33  ALT 0 - 35 U/L 8 10 40  Alk Phosphatase 39 - 117 U/L 145(H) 162(H) 126  Total Bilirubin 0.2 - 1.2 mg/dL 0.3 0.4 0.5   The 10-year ASCVD risk score Mikey Bussing DC Jr., et al., 2013) is: 37.5%   Values used to calculate the score:     Age: 86 years     Sex: Female     Is Non-Hispanic African American: No     Diabetic: Yes     Tobacco smoker: Yes     Systolic Blood Pressure: 144 mmHg     Is BP treated: Yes     HDL Cholesterol: 44  mg/dL     Total Cholesterol: 209 mg/dL  Patient has failed these meds in past: n/a Patient is currently controlled on the following medications:   atorvastatin 20 mg daily  We discussed: good vs bad cholesterol, LDL goals. Discussed LDL goal of < 70 to prevent heart disease/stroke, provided examples of foods to avoid.  Plan  Continue current medications and control with diet and exercise   Chronic Pain/Osteoarthritis   Patient has failed these meds in past: tramadol, oxycodone/apap, methocarbamol Patient is currently controlled on the following medications:   Fentanyl patch 50 mcg/hr,   hydrocodone/APAP 10-325 mg 1-2 tab q6h PRN,   Tylenol 1000 mg q6h prn,   Narcan PRN,   gabapentin 400 mg up to TID,   tizanidine 4 mg TID prn  We discussed: patient takes all pain meds exactly as prescribed. Discussed dangers of oversedation and combination of opiods/benzo. Pt does have Narcan at home, she and her daughter (who lives with her) know how and when to use it.  Plan  Continue current medications    GERD/GI   Patient has failed these meds in past: n/a Patient is currently controlled on the following medications:   omeprazole 40 mg daily,   cimetidine 400 mg BID,   docusate 200 mg HS,   senna 2 tab HS  We discussed: last visit pt was taking omeprazole BID, but denied significant issues with heartburn, so reduced dose to once daily. Pt has been compliant with reduced dose and denies GERD sx.  Plan  Continue current medications   Depression / Anxiety / Insomnia   Depression screen [facility] 2/9 08/24/2020 04/04/2020 12/09/2018  Decreased Interest 0 0 0  Down, Depressed, Hopeless 0 0 -  PHQ - 2 Score 0 0 0  Altered sleeping - 2 -  Tired, decreased energy - 0 -  Change in appetite - 0 -  Feeling bad or failure about yourself  - 0 -  Trouble concentrating - 0 -  Moving slowly or fidgety/restless - 0 -  Suicidal thoughts - 0 -  PHQ-9 Score - 2 -  Difficult doing  work/chores - Not difficult at all -  Some recent data might be hidden   No flowsheet data found.  Patient has failed these meds in past: zolpidem Patient is currently {CHL Controlled/[phone]} on the following medications:  . Venlafaxine ER 50 mg - 2 cap daily . Quetiapine 150 mg HS . Clonazepam 1 mg BID prn  We discussed:  ***  Plan  Continue {CHL [facility] [phone]}  Medication Management   Pt uses [facility] for all medications Pt uses pill box, sets up herself each week with maintenance meds. Pain meds are excluded from this Pt endorses 99% compliance - misses a dose maybe once/month  We discussed:  Benefits of medication synchronization, pill packaging and delivery with [facility].  Plan  Continue current medication management strategy    Follow up: *** month phone visit  ***

## 2020-09-06 ENCOUNTER — Telehealth: Payer: Self-pay | Admitting: Specialist

## 2020-09-06 NOTE — Telephone Encounter (Signed)
Pt called and needs a form sent to her by mail that states that her daughter is her caregiver

## 2020-09-07 ENCOUNTER — Other Ambulatory Visit: Payer: Self-pay | Admitting: Internal Medicine

## 2020-09-07 ENCOUNTER — Encounter: Payer: Self-pay | Admitting: Radiology

## 2020-09-07 NOTE — Telephone Encounter (Signed)
Note written and mailed to patient at the address on file.

## 2020-09-08 NOTE — Telephone Encounter (Signed)
[place] Controlled Database Checked Last filled:  08/16/20  # 60 LOV w/you:   06/20/20  Next appt w/you:  none

## 2020-09-14 ENCOUNTER — Telehealth: Payer: Self-pay | Admitting: Pharmacist

## 2020-09-15 ENCOUNTER — Ambulatory Visit: Payer: Medicare Other | Admitting: Internal Medicine

## 2020-09-22 NOTE — Telephone Encounter (Signed)
Patient never started hydralazine in October for high BP. Scheduled CCM f/u visit to discuss.

## 2020-09-22 NOTE — Progress Notes (Addendum)
Chronic Care Management Pharmacy Assistant   Name: Jill Phillips  MRN: [id number] DOB: 12-Jan-1956  Reason for Encounter: General Adherence Call   PCP : Jill Rail, MD  Allergies:   Allergies  Allergen Reactions   Flagyl [Metronidazole] Diarrhea   Limonene Itching    Yeast infection in mouth   Sulfa Antibiotics Other (See Comments)    Yeast infection in mouth   Doxycycline     Mouth soreness - reaction vs thrush?   Amoxicillin Other (See Comments)    REACTION: Oral yeast infection Has patient had a PCN reaction causing immediate rash, facial/tongue/throat swelling, SOB or lightheadedness with hypotension: No Has patient had a PCN reaction causing severe rash involving mucus membranes or skin necrosis: No Has patient had a PCN reaction that required hospitalization No Has patient had a PCN reaction occurring within the last 10 years: No If all of the above answers are "NO", then may proceed with Cephalosporin use.    Chlorzoxazone Other (See Comments)    headache   Codeine Other (See Comments)    headache   Darvocet [Propoxyphene N-Acetaminophen] Itching   Dilaudid [Hydromorphone Hcl] Itching   Keflex [Cephalexin] Other (See Comments)    Pt does not recall reaction (maybe yeast infection)   Morphine And Related Itching   Nitrofurantoin Monohyd Macro Hives    Reaction to [employer] [Oxycodone-Acetaminophen] Itching    Patient can tolerate Acetaminophen solely   Trazodone And Nefazodone Palpitations    Medications: Outpatient Encounter Medications as of 09/14/2020  Medication Sig   acetaminophen (TYLENOL) 500 MG tablet Take 1,000 mg by mouth every 6 (six) hours as needed (for headaches.).   albuterol (PROAIR HFA) 108 (90 Base) MCG/ACT inhaler INHALE 1 PUFF INTO THE LUNGS EVERY 6 HOURS AS NEEDED FOR WHEEZING OR SHORTNESS OF BREATH.NEED OFFICE VISIT FOR FURTHER REFILLS (Patient taking differently: Inhale 1 puff into the lungs every 6 (six) hours as needed for  wheezing or shortness of breath.)   amLODipine-benazepril (LOTREL) 10-40 MG capsule TAKE 1 CAPSULE BY MOUTH DAILY.   atorvastatin (LIPITOR) 20 MG tablet TAKE 1 TABLET BY MOUTH DAILY.   cimetidine (TAGAMET) 400 MG tablet Take 400 mg by mouth 2 (two) times daily.   clonazePAM (KLONOPIN) 1 MG tablet TAKE 1 TABLET BY MOUTH 2 TIMES DAILY AS NEEDED FOR ANXIETY   docusate sodium (COLACE) 100 MG capsule Take 200 mg by mouth at bedtime.   fentaNYL 37.5 MCG/HR PT72 Apply 1 patch topically every 3 (three) days.   gabapentin (NEURONTIN) 400 MG capsule TAKE 1 CAPSULE BY MOUTH 3 TIMES DAILY.   hydrALAZINE (APRESOLINE) 25 MG tablet Take 1 tablet (25 mg total) by mouth 3 (three) times daily.   HYDROcodone-acetaminophen (NORCO) 10-325 MG tablet Take 1 tablet by mouth every 6 (six) hours as needed for severe pain (Chronic back pain).   levothyroxine (SYNTHROID) 88 MCG tablet TAKE 1 TABLET BY MOUTH DAILY.   Methen-Hyosc-Meth Blue-Na Phos (ME/NAPHOS/MB/HYO1) 81.6 MG TABS Take 1 tablet by mouth 3 (three) times daily. (Patient not taking: Reported on 08/24/2020)   NARCAN 4 MG/0.1ML LIQD nasal spray kit Place 1 spray into the nose as needed (accidental overdose). (Patient not taking: Reported on 08/24/2020)   nicotine (NICODERM CQ - DOSED IN MG/24 HOURS) 21 mg/24hr patch Place 1 patch (21 mg total) onto the skin daily. (Patient not taking: Reported on 08/24/2020)   omeprazole (PRILOSEC) 40 MG capsule TAKE 1 CAPSULE BY MOUTH 2 TIMES DAILY BEFORE A MEAL.  QUEtiapine (SEROQUEL) 100 MG tablet Take 1.5 tablets (150 mg total) by mouth at bedtime.   rOPINIRole (REQUIP) 1 MG tablet SMARTSIG:1 Tablet(s) By Mouth Every Evening (Patient not taking: Reported on 08/24/2020)   Sennosides (SENNA LAX PO) Take 2 tablets by mouth at bedtime.  (Patient not taking: Reported on 08/24/2020)   SPIRIVA HANDIHALER 18 MCG inhalation capsule PLACE 1 CAPSULE INTO INHALER AND INHALE DAILY.   tiZANidine (ZANAFLEX) 4 MG tablet TAKE 1 TABLET BY MOUTH  EVERY 8 HOURS AS NEEDED   valACYclovir (VALTREX) 500 MG tablet Take 1 tablet (500 mg total) by mouth daily.   venlafaxine XR (EFFEXOR-XR) 150 MG 24 hr capsule TAKE 2 CAPSULES BY MOUTH DAILY.   No facility-administered encounter medications on file as of 09/14/2020.    Current Diagnosis: Patient Active Problem List   Diagnosis Date Noted   Acute gout 06/20/2020   Interstitial cystitis 01/21/2020   Status post shoulder surgery 09/09/2019   Nausea 08/19/2019   Anxiety 05/04/2019   Insomnia 05/04/2019   Syncope 12/08/2018   Disorder of rotator cuff syndrome of left shoulder and allied disorder 06/22/2018   Bilateral leg edema 06/04/2018   Type 2 diabetes mellitus without complication, without long-term current use of insulin ([place]) 12/02/2017   Sacral pain 09/17/2017   COPD (chronic obstructive pulmonary disease) ([place]) 04/21/2017   Lower back pain 03/03/2017   CKD (chronic kidney disease) stage 3, GFR 30-59 ml/min ([facility]) 08/07/2016   GERD (gastroesophageal reflux disease) 08/07/2016   Chronic respiratory failure ([place]) 07/29/2016   Arthritis of carpometacarpal (CMC) joint of left thumb 07/09/2016   Pneumothorax on left 01/31/2016   Chronic, continuous use of opioids 09/06/2015   Degenerative disc disease, lumbar 08/01/2015    Class: Chronic   Spondylolisthesis of lumbar region 08/01/2015    Class: Chronic   Urinary, incontinence, stress female 05/06/2014   Constipation 05/05/2014   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/14/2013   HTN (hypertension) 05/19/2013   HLD (hyperlipidemia) 05/19/2013   Depression 05/19/2013   Hypothyroidism 05/19/2013   Tobacco abuse    Osteoarthritis of both knees 11/18/2011   RLS (restless legs syndrome) 11/18/2011    Goals Addressed   None     Follow-Up:  Pharmacist Review   Per Pharmacist: "see if patient is taking hydralazine - it was a new med in October and looks like only filled once x 30 days. Also see if taking both omeprazole and pantoprazole - both have  been filled but only omeprazole is still prescribed by Jill Phillips."  A general Adherence call was made to Jill Phillips and asked if she was still taking hydralazine that was prescribed by Jill. Quay Phillips back in October. The patient states that she has not taken this medication before or at least she does not remember taking it. Also she states that she is taking the omeprazole it seems to work best for her. She is not taking pantoprazole at this time. I let the patient know that I will pass the information along to [facility].    Wendy Poet, [place] [phone]

## 2020-09-26 ENCOUNTER — Telehealth: Payer: Medicare Other

## 2020-09-26 ENCOUNTER — Other Ambulatory Visit: Payer: Self-pay | Admitting: Internal Medicine

## 2020-09-26 NOTE — Chronic Care Management (AMB) (Deleted)
Chronic Care Management Pharmacy  Name: Jill Phillips  MRN: [id number] DOB: 02/02/1956   Chief Complaint/ HPI  Jill Phillips,  65 y.o. , female presents for their Follow-Up CCM visit with the clinical pharmacist via telephone.  PCP : [name], MD  Their chronic conditions include: HTN, HLD, COPD, T2DM, CKD, GERD, Hypothyroidism, OA knees, DDD, depression/anxiety, insomnia   Office Visits: 06/28/20 pt call - switched zolpidem back to seroquel. Increased to 150 mg at night.  06/20/20 Dr Quay Burow OV: acute gout, rx'd prednisone taper. DC'd seroquel d/t ineffective, rx'd zolpidem 5 mg HS in lieu of 5 Benadryl. Avoiding hctz/lasix due to GFR. Added hydralazine 25 mg TID. A1c increased to 6.6, now diabetic.  11/16/19 Dr Quay Burow OV: recurrent UTI, rx Augmentin and fluconazole. Urine cx came back contaminated, not likely true UTI. Referred to urology for continued bladder pain.  11/08/19 Dr Quay Burow VV: UTI, tx with Cipro and Azo prn.  08/19/19 Dr Jenny Reichmann OV : acute visit, rx'd zofran for nausea.  06/21/19 Dr Quay Burow OV: pain meds per ortho, looking for new pain mgmt. Increased gabapentin to 400 mg TID. Pt wanted to increase seroquel for sleep, MD declined d/t polypharmacy concerns.  Consult Visit: 08/24/20 NP Justin Mend ([facility]): referred to GI for epigastric pain and vomiting.  08/11/20 Dr [name] (ortho): lumbar DDD, No med changes, referred to urology Dr Amalia Hailey for pelvic floor pain.  11/15/19 Dr Marlou Sa (orthopedic surg): bilateral thumb pain d/t arthritis. Normal X-ray of shoulder. Plan for thumb injections at f/u.  10/13/19 Dr Marlou Sa (orthopedic surg): Shoulder surgery 09/09/19.  No issues, RTC 4 wks.  08/31/19 ED visit: shoulder pain.  Allergies  Allergen Reactions  . Flagyl [Metronidazole] Diarrhea  . Limonene Itching    Yeast infection in mouth  . Sulfa Antibiotics Other (See Comments)    Yeast infection in mouth  . Doxycycline     Mouth soreness - reaction vs thrush?  [name] Amoxicillin  Other (See Comments)    REACTION: Oral yeast infection Has patient had a PCN reaction causing immediate rash, facial/tongue/throat swelling, SOB or lightheadedness with hypotension: No Has patient had a PCN reaction causing severe rash involving mucus membranes or skin necrosis: No Has patient had a PCN reaction that required hospitalization No Has patient had a PCN reaction occurring within the last 10 years: No If all of the above answers are "NO", then may proceed with Cephalosporin use.   . Chlorzoxazone Other (See Comments)    headache  . Codeine Other (See Comments)    headache  . Darvocet [Propoxyphene N-Acetaminophen] Itching  . Dilaudid [Hydromorphone Hcl] Itching  . Keflex [Cephalexin] Other (See Comments)    Pt does not recall reaction (maybe yeast infection)  . Morphine And Related Itching  . Nitrofurantoin Monohyd Macro Hives    Reaction to [employer]  . Percocet [Oxycodone-Acetaminophen] Itching    Patient can tolerate Acetaminophen solely  . Trazodone And Nefazodone Palpitations   Medications: Outpatient Encounter Medications as of 09/26/2020  Medication Sig  . acetaminophen (TYLENOL) 500 MG tablet Take 1,000 mg by mouth every 6 (six) hours as needed (for headaches.).  [name] albuterol (PROAIR HFA) 108 (90 Base) MCG/ACT inhaler INHALE 1 PUFF INTO THE LUNGS EVERY 6 HOURS AS NEEDED FOR WHEEZING OR SHORTNESS OF BREATH.NEED OFFICE VISIT FOR FURTHER REFILLS (Patient taking differently: Inhale 1 puff into the lungs every 6 (six) hours as needed for wheezing or shortness of breath.)  . amLODipine-benazepril (LOTREL) 10-40 MG capsule TAKE 1 CAPSULE BY  MOUTH DAILY.  . atorvastatin (LIPITOR) 20 MG tablet TAKE 1 TABLET BY MOUTH DAILY.  . cimetidine (TAGAMET) 400 MG tablet Take 400 mg by mouth 2 (two) times daily.  . clonazePAM (KLONOPIN) 1 MG tablet TAKE 1 TABLET BY MOUTH 2 TIMES DAILY AS NEEDED FOR ANXIETY  . docusate sodium (COLACE) 100 MG capsule Take 200 mg by mouth at bedtime.  .  fentaNYL 37.5 MCG/HR PT72 Apply 1 patch topically every 3 (three) days.  . gabapentin (NEURONTIN) 400 MG capsule TAKE 1 CAPSULE BY MOUTH 3 TIMES DAILY.  . hydrALAZINE (APRESOLINE) 25 MG tablet Take 1 tablet (25 mg total) by mouth 3 (three) times daily. (Patient not taking: Reported on 09/22/2020)  . HYDROcodone-acetaminophen (NORCO) 10-325 MG tablet Take 1 tablet by mouth every 6 (six) hours as needed for severe pain (Chronic back pain).  . levothyroxine (SYNTHROID) 88 MCG tablet TAKE 1 TABLET BY MOUTH DAILY.  . Methen-Hyosc-Meth Blue-Na Phos (ME/NAPHOS/MB/HYO1) 81.6 MG TABS Take 1 tablet by mouth 3 (three) times daily.  . NARCAN 4 MG/0.1ML LIQD nasal spray kit Place 1 spray into the nose as needed (accidental overdose).  . nicotine (NICODERM CQ - DOSED IN MG/24 HOURS) 21 mg/24hr patch Place 1 patch (21 mg total) onto the skin daily.  . omeprazole (PRILOSEC) 40 MG capsule TAKE 1 CAPSULE BY MOUTH 2 TIMES DAILY BEFORE A MEAL.  . QUEtiapine (SEROQUEL) 100 MG tablet Take 1.5 tablets (150 mg total) by mouth at bedtime.  . rOPINIRole (REQUIP) 1 MG tablet   . Sennosides (SENNA LAX PO) Take 2 tablets by mouth at bedtime.  . SPIRIVA HANDIHALER 18 MCG inhalation capsule PLACE 1 CAPSULE INTO INHALER AND INHALE DAILY.  . tiZANidine (ZANAFLEX) 4 MG tablet TAKE 1 TABLET BY MOUTH EVERY 8 HOURS AS NEEDED  . valACYclovir (VALTREX) 500 MG tablet Take 1 tablet (500 mg total) by mouth daily.  . venlafaxine XR (EFFEXOR-XR) 150 MG 24 hr capsule TAKE 2 CAPSULES BY MOUTH DAILY.   No facility-administered encounter medications on file as of 09/26/2020.   Wt Readings from Last 3 Encounters:  08/11/20 156 lb (70.8 kg)  06/20/20 156 lb (70.8 kg)  04/04/20 152 lb (68.9 kg)   Lab Results  Component Value Date   CREATININE 1.09 06/20/2020   BUN 13 06/20/2020   GFR 53.09 (L) 06/20/2020   GFRNONAA 51 (L) 09/07/2019   GFRAA 59 (L) 09/07/2019   NA 142 06/20/2020   K 3.8 06/20/2020   CALCIUM 9.1 06/20/2020   CO2 30  06/20/2020   Current Diagnosis/Assessment:    Goals Addressed   None    COPD   Last spirometry score: FEV1 88% predicted, FEV/FVC 0.86  Patient has failed these meds in past: n/a Patient is currently controlled on the following medications:   albuterol MDI prn  Spiriva 1 puff daily  Using maintenance inhaler regularly? Yes Frequency of rescue inhaler use:  1-2x per week  We discussed:  proper inhaler technique, Last visit we discussed importance of daily use of Spiriva to prevent wheezing/SOB, as patient had not been taking it. Pt reports she has been compliant with daily Spiriva and has noticed improvement in breathing/wheezing. She reports Dr Sood (pulmonary) initially prescribed this, she has not followed up with him in over 2 years so wonders if PCP can refill this for her.   Plan  Continue current medications   Tobacco Abuse   Tobacco Status:  Social History   Tobacco Use  Smoking Status Current Every Day Smoker  .   Packs/day: 1.25  . Years: 45.00  . Pack years: 56.25  . Types: Cigarettes  Smokeless Tobacco Never Used  Tobacco Comment   form given 12/25/16   Patient smokes After 30 minutes of waking  Patient has failed these meds in past: nicotine patches, gum, Chantix (recalled)  We discussed: ***  Plan  ***  Diabetes   A1c goal < 7%  Recent Relevant Labs: Lab Results  Component Value Date/Time   HGBA1C 6.6 (H) 06/20/2020 12:15 PM   HGBA1C 6.2 01/21/2020 03:02 PM    Last diabetic eye exam:  Lab Results  Component Value Date/Time   HMDIABEYEEXA No Retinopathy 12/09/2017 12:31 PM   No medications indicated.  We discussed: what prediabetes means, what A1c means, how to prevent progression to DM. Discussed which foods are high in carbs and sugar that contribute to DM.  Plan  Continue control with diet and exercise    Hypertension   Office blood pressures are  BP Readings from Last 3 Encounters:  08/11/20 (!) 156/91  06/20/20 (!)  166/86  04/04/20 (!) 148/84   Patient is currently controlled on the following medications:   amlodipine-benazepril 10-40 mg daily  Patient checks BP at home infrequently  Patient home BP readings are ranging: ~130/80  We discussed BP goals, importance of controlling BP for heart disease prevention. Last visit, combined amlodipine and benazepril into one tablet, pt reports she is compliant with new dose and stopped taking separate pills.  Plan  Continue current medications and control with diet and exercise    Hyperlipidemia   LDL goal< 100  Last lipids Lab Results  Component Value Date   CHOL 209 (H) 01/21/2020   HDL 44.00 01/21/2020   LDLCALC 86 11/03/2018   LDLDIRECT 124.0 01/21/2020   TRIG 219.0 (H) 01/21/2020   CHOLHDL 5 01/21/2020   Hepatic Function Latest Ref Rng & Units 06/20/2020 01/21/2020 12/03/2018  Total Protein 6.0 - 8.3 g/dL 7.6 7.9 8.0  Albumin 3.5 - 5.2 g/dL 4.5 4.6 4.5  AST 0 - 37 U/L 15 17 33  ALT 0 - 35 U/L 8 10 40  Alk Phosphatase 39 - 117 U/L 145(H) 162(H) 126  Total Bilirubin 0.2 - 1.2 mg/dL 0.3 0.4 0.5   The 10-year ASCVD risk score (Goff DC Jr., et al., 2013) is: 37.5%   Values used to calculate the score:     Age: 64 years     Sex: Female     Is Non-Hispanic African American: No     Diabetic: Yes     Tobacco smoker: Yes     Systolic Blood Pressure: 156 mmHg     Is BP treated: Yes     HDL Cholesterol: 44 mg/dL     Total Cholesterol: 209 mg/dL  Patient has failed these meds in past: n/a Patient is currently controlled on the following medications:   atorvastatin 20 mg daily  We discussed: good vs bad cholesterol, LDL goals. Discussed LDL goal of < 70 to prevent heart disease/stroke, provided examples of foods to avoid.  Plan  Continue current medications and control with diet and exercise   Chronic Pain/Osteoarthritis   Patient has failed these meds in past: tramadol, oxycodone/apap, methocarbamol Patient is currently controlled  on the following medications:   Fentanyl patch 50 mcg/hr,   hydrocodone/APAP 10-325 mg 1-2 tab q6h PRN,   Tylenol 1000 mg q6h prn,   Narcan PRN,   gabapentin 400 mg up to TID,   tizanidine 4 mg TID prn    We discussed: patient takes all pain meds exactly as prescribed. Discussed dangers of oversedation and combination of opiods/benzo. Pt does have Narcan at home, she and her daughter (who lives with her) know how and when to use it.  Plan  Continue current medications    GERD/GI   Patient has failed these meds in past: pantoprazole Patient is currently controlled on the following medications:   omeprazole 40 mg daily,   cimetidine 400 mg BID,   docusate 200 mg HS,   senna 2 tab HS  We discussed: last visit pt was taking omeprazole BID, but denied significant issues with heartburn, so reduced dose to once daily. Pt has been compliant with reduced dose and denies GERD sx.  Plan  Continue current medications   Depression / Anxiety / Insomnia   Depression screen [facility] 2/9 08/24/2020 04/04/2020 12/09/2018  Decreased Interest 0 0 0  Down, Depressed, Hopeless 0 0 -  PHQ - 2 Score 0 0 0  Altered sleeping - 2 -  Tired, decreased energy - 0 -  Change in appetite - 0 -  Feeling bad or failure about yourself  - 0 -  Trouble concentrating - 0 -  Moving slowly or fidgety/restless - 0 -  Suicidal thoughts - 0 -  PHQ-9 Score - 2 -  Difficult doing work/chores - Not difficult at all -  Some recent data might be hidden   No flowsheet data found.  Patient has failed these meds in past: zolpidem Patient is currently {CHL Controlled/Uncontrolled:[phone]} on the following medications:  . Venlafaxine ER 50 mg - 2 cap daily . Quetiapine 150 mg HS . Clonazepam 1 mg BID prn  We discussed:  ***  Plan  Continue {CHL [facility] Plans:[phone]}  Medication Management   Pt uses [facility] for all medications Pt uses pill box, sets up herself each week with  maintenance meds. Pain meds are excluded from this Pt endorses 99% compliance - misses a dose maybe once/month  We discussed:  Benefits of medication synchronization, pill packaging and delivery with [facility].  Plan  Continue current medication management strategy    Follow up: *** month phone visit  ***

## 2020-09-27 DIAGNOSIS — Z79899 Other long term (current) drug therapy: Secondary | ICD-10-CM | POA: Diagnosis not present

## 2020-09-27 DIAGNOSIS — M545 Low back pain, unspecified: Secondary | ICD-10-CM | POA: Diagnosis not present

## 2020-09-27 DIAGNOSIS — R9431 Abnormal electrocardiogram [ECG] [EKG]: Secondary | ICD-10-CM | POA: Diagnosis not present

## 2020-09-27 DIAGNOSIS — J209 Acute bronchitis, unspecified: Secondary | ICD-10-CM | POA: Diagnosis not present

## 2020-09-27 DIAGNOSIS — R0602 Shortness of breath: Secondary | ICD-10-CM | POA: Diagnosis not present

## 2020-09-29 ENCOUNTER — Other Ambulatory Visit: Payer: Self-pay

## 2020-09-29 ENCOUNTER — Ambulatory Visit: Payer: Medicare Other | Admitting: Pharmacist

## 2020-09-29 DIAGNOSIS — Z720 Tobacco use: Secondary | ICD-10-CM

## 2020-09-29 DIAGNOSIS — M549 Dorsalgia, unspecified: Secondary | ICD-10-CM

## 2020-09-29 DIAGNOSIS — F419 Anxiety disorder, unspecified: Secondary | ICD-10-CM

## 2020-09-29 DIAGNOSIS — I10 Essential (primary) hypertension: Secondary | ICD-10-CM

## 2020-09-29 DIAGNOSIS — E7849 Other hyperlipidemia: Secondary | ICD-10-CM

## 2020-09-29 DIAGNOSIS — J439 Emphysema, unspecified: Secondary | ICD-10-CM

## 2020-09-29 DIAGNOSIS — G8929 Other chronic pain: Secondary | ICD-10-CM

## 2020-09-29 DIAGNOSIS — F3289 Other specified depressive episodes: Secondary | ICD-10-CM

## 2020-09-29 NOTE — Progress Notes (Signed)
Chronic Care Management Pharmacy Note  09/29/2020 Name:  Jill Phillips MRN:  [id number] DOB:  01/06/1956  Subjective: Jill Phillips is an 65 y.o. year old female who is a primary patient of Burns, Claudina Lick, MD.  The CCM team was consulted for assistance with disease management and care coordination needs.   Engaged with patient by telephone for follow up visit in response to provider referral for pharmacy case management and/or care coordination services.   Consent to Services:  The patient was given the following information about Chronic Care Management services today, agreed to services, and gave verbal consent: 1. CCM service includes personalized support from designated clinical staff supervised by the primary care provider, including individualized plan of care and coordination with other care providers 2. 24/7 contact phone numbers for assistance for urgent and routine care needs. 3. Service will only be billed when office clinical staff spend 20 minutes or more in a month to coordinate care. 4. Only one practitioner may furnish and bill the service in a calendar month. 5.The patient may stop CCM services at any time (effective at the end of the month) by phone call to the office staff. 6. The patient will be responsible for cost sharing (co-pay) of up to 20% of the service fee (after annual deductible is met). Patient agreed to services and consent obtained.  Patient Care Team: [name], MD as PCP - General (Internal Medicine) Charlton Haws, [facility] as Pharmacist (Pharmacist)  Patient's daughter lives with her and "is not doing what she's supposed to" and this causes her a lot of stress. Patient saw pain management doctor yesterday, they did a chest Xray and found an infection, gave her antibiotic and steroid injections but did not prescribe oral medications. 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 Pt reports she is very stressed about all of this as well.  Recent office visits: 06/28/20 pt call - switched zolpidem back to seroquel at pt's request.  06/20/20 Dr Quay Burow OV: chronic f/u - c/o urinary frequency, LE swelling, toe pain, depression, BP elevated d/t stress, insomnia. Acute gout - rx'd prednisone taper. Insomnia - dc'd seroquel, started Zolpidem 5 mg. Added hydralazine 25 mg TID for BP.  11/16/19 Dr Quay Burow OV: recurrent UTI, rx Augmentin and fluconazole. Urine cx came back contaminated, not likely true UTI. Referred to urology for continued bladder pain.  Recent consult visits: 08/24/20 NP Justin Mend ([facility]): referred to GI for epigastric pain, emesis.  08/11/20 Dr [name] (orthopedic surg): f/u DDD. No med changes.  03/17/20 Dr Primus Bravo (pain mgmt): f/u chronic pain.  11/15/19 Dr Marlou Sa (orthopedic surg): bilateral thumb pain d/t arthritis. Normal X-ray of shoulder. Plan for thumb injections at f/u.  10/13/19 Dr Marlou Sa (orthopedic surg): Shoulder surgery 09/09/19.  No issues, RTC 4 wks.  Objective:  Lab Results  Component Value Date   CREATININE 1.09 06/20/2020   BUN 13 06/20/2020   GFR 53.09 (L) 06/20/2020   GFRNONAA 51 (L) 09/07/2019   GFRAA 59 (L) 09/07/2019   NA 142 06/20/2020   K 3.8 06/20/2020   CALCIUM 9.1 06/20/2020   CO2 30 06/20/2020    Lab Results  Component Value Date/Time   HGBA1C 6.6 (H) 06/20/2020 12:15 PM   HGBA1C 6.2 01/21/2020 03:02 PM   GFR 53.09 (L) 06/20/2020 12:15 PM   GFR 49.86 (L) 01/21/2020 03:02 PM    Last diabetic Eye exam:  Lab Results  Component Value Date/Time   HMDIABEYEEXA No Retinopathy 12/09/2017 12:31 PM    Last diabetic Foot exam: No results found for: HMDIABFOOTEX   Lab Results  Component Value Date   CHOL 209 (H) 01/21/2020   HDL 44.00 01/21/2020   LDLCALC 86 11/03/2018   LDLDIRECT 124.0 01/21/2020   TRIG 219.0 (H) 01/21/2020   CHOLHDL 5 01/21/2020    Hepatic Function Latest Ref Rng & Units 06/20/2020  01/21/2020 12/03/2018  Total Protein 6.0 - 8.3 g/dL 7.6 7.9 8.0  Albumin 3.5 - 5.2 g/dL 4.5 4.6 4.5  AST 0 - 37 U/L 15 17 33  ALT 0 - 35 U/L 8 10 40  Alk Phosphatase 39 - 117 U/L 145(H) 162(H) 126  Total Bilirubin 0.2 - 1.2 mg/dL 0.3 0.4 0.5    Lab Results  Component Value Date/Time   TSH 3.65 06/20/2020 12:15 PM   TSH 1.04 01/21/2020 03:02 PM   FREET4 0.91 05/22/2015 03:52 PM   FREET4 0.88 01/18/2015 12:36 PM    CBC Latest Ref Rng & Units 06/20/2020 01/21/2020 09/07/2019  WBC 4.0 - 10.5 K/uL 8.3 7.7 7.8  Hemoglobin 12.0 - 15.0 g/dL 13.1 13.8 13.3  Hematocrit 36.0 - 46.0 % 39.5 42.1 41.3  Platelets 150.0 - 400.0 K/uL 235.0 291.0 264   No results found for: VD25OH  Clinical ASCVD: No  The 10-year ASCVD risk score Mikey Bussing DC Jr., et al., 2013) is: 37.5%   Values used to calculate the score:     Age: 58 years     Sex: Female     Is Non-Hispanic African American: No     Diabetic: Yes     Tobacco smoker: Yes     Systolic Blood Pressure: 161 mmHg     Is BP treated: Yes     HDL Cholesterol: 44 mg/dL     Total Cholesterol: 209 mg/dL    Depression screen [facility] 2/9 08/24/2020 04/04/2020 12/09/2018  Decreased Interest 0 0 0  Down, Depressed, Hopeless 0 0 -  PHQ - 2 Score 0 0 0  Altered sleeping - 2 -  Tired, decreased energy - 0 -  Change in appetite - 0 -  Feeling bad or failure about yourself  - 0 -  Trouble concentrating - 0 -  Moving slowly or fidgety/restless - 0 -  Suicidal thoughts - 0 -  PHQ-9 Score - 2 -  Difficult doing work/chores - Not difficult at all -  Some recent data might be hidden   No flowsheet data found.  Social History   Tobacco Use  Smoking Status Current Every Day Smoker  . Packs/day: 1.25  . Years: 45.00  . Pack years: 56.25  . Types: Cigarettes  Smokeless Tobacco Never Used  Tobacco Comment   form given 12/25/16    BP Readings from Last 3 Encounters:  08/11/20 (!) 156/91  06/20/20 (!) 166/86  04/04/20 (!) 148/84   Pulse Readings from Last 3  Encounters:  08/11/20 (!) 103  06/20/20 (!) 104  04/04/20 87   Wt Readings from Last 3 Encounters:  08/11/20 156 lb (70.8 kg)  06/20/20 156 lb (70.8 kg)  04/04/20 152 lb (68.9 kg)    Assessment/Interventions: Review of patient past medical history, allergies, medications, health status, including review of consultants reports, laboratory and other test data, was performed as part of comprehensive evaluation and provision of chronic care management services.   SDOH:  (Social Determinants of Health) assessments and interventions performed:  SDOH Interventions  Flowsheet Row Most Recent Value  SDOH Interventions   Financial Strain Interventions Intervention Not Indicated      CCM Care Plan  Allergies  Allergen Reactions  . Flagyl [Metronidazole] Diarrhea  . Limonene Itching    Yeast infection in mouth  . Sulfa Antibiotics Other (See Comments)    Yeast infection in mouth  . Doxycycline     Mouth soreness - reaction vs thrush?  [name] Amoxicillin Other (See Comments)    REACTION: Oral yeast infection Has patient had a PCN reaction causing immediate rash, facial/tongue/throat swelling, SOB or lightheadedness with hypotension: No Has patient had a PCN reaction causing severe rash involving mucus membranes or skin necrosis: No Has patient had a PCN reaction that required hospitalization No Has patient had a PCN reaction occurring within the last 10 years: No If all of the above answers are "NO", then may proceed with Cephalosporin use.   . Chlorzoxazone Other (See Comments)    headache  . Codeine Other (See Comments)    headache  . Darvocet [Propoxyphene N-Acetaminophen] Itching  . Dilaudid [Hydromorphone Hcl] Itching  . Keflex [Cephalexin] Other (See Comments)    Pt does not recall reaction (maybe yeast infection)  . Morphine And Related Itching  . Nitrofurantoin Monohyd Macro Hives    Reaction to [employer]  . Percocet [Oxycodone-Acetaminophen] Itching    Patient can tolerate  Acetaminophen solely  . Trazodone And Nefazodone Palpitations    Medications Reviewed Today    Reviewed by Charlton Haws, [facility] (Pharmacist) on 09/29/20 at [place] List Status: <None>  Medication Order Taking? Sig Documenting Provider Last Dose Status Informant  acetaminophen (TYLENOL) 500 MG tablet [id number] Yes Take 1,000 mg by mouth every 6 (six) hours as needed (for headaches.). [provider] Taking Active Self  albuterol (PROAIR HFA) 108 (90 Base) MCG/ACT inhaler [id number] Yes INHALE 1 PUFF INTO THE LUNGS EVERY 6 HOURS AS NEEDED FOR WHEEZING OR SHORTNESS OF BREATH.NEED OFFICE VISIT FOR FURTHER REFILLS  Patient taking differently: Inhale 1 puff into the lungs every 6 (six) hours as needed for wheezing or shortness of breath.   Flossie Buffy, NP Taking Active   amLODipine-benazepril (LOTREL) 10-40 MG capsule [id number] Yes TAKE 1 CAPSULE BY MOUTH DAILY. [name], MD Taking Active   atorvastatin (LIPITOR) 20 MG tablet [id number] Yes TAKE 1 TABLET BY MOUTH DAILY. [name], MD Taking Active   cimetidine (TAGAMET) 400 MG tablet [id number] Yes Take 400 mg by mouth 2 (two) times daily. [provider] Taking Active Self  clonazePAM (KLONOPIN) 1 MG tablet [id number] Yes TAKE 1 TABLET BY MOUTH 2 TIMES DAILY AS NEEDED FOR ANXIETY Burns, Claudina Lick, MD Taking Active   docusate sodium (COLACE) 100 MG capsule [id number] Yes Take 200 mg by mouth at bedtime. [provider] Taking Active Self  fentaNYL 37.5 MCG/HR PT72 [id number] Yes Apply 1 patch topically every 3 (three) days. [provider] Taking Active   gabapentin (NEURONTIN) 400 MG capsule [id number] Yes TAKE 1 CAPSULE BY MOUTH 3 TIMES DAILY. [name], MD Taking Active   HYDROcodone-acetaminophen [facility]) 10-325 MG tablet [id number] Yes Take 1 tablet by mouth every 6 (six) hours as needed for severe pain (Chronic back pain). [name], MD Taking Active   levothyroxine (SYNTHROID) 88 MCG  tablet [id number] Yes TAKE 1 TABLET BY MOUTH DAILY. [name], MD Taking Active   Methen-Hyosc-Meth Blue-Na Phos (ME/NAPHOS/MB/HYO1) 81.6 MG TABS [id number] Yes Take 1 tablet by mouth 3 (three)  times daily. [provider] Taking Active   NARCAN 4 MG/0.1ML LIQD nasal spray kit [id number] Yes Place 1 spray into the nose as needed (accidental overdose). [name], MD Taking Active   nicotine (NICODERM CQ - DOSED IN MG/24 HOURS) 21 mg/24hr patch [id number] Yes Place 1 patch (21 mg total) onto the skin daily. [name], MD Taking Active   omeprazole (PRILOSEC) 40 MG capsule [id number] Yes TAKE 1 CAPSULE BY MOUTH 2 TIMES DAILY BEFORE A MEAL. [name], MD Taking Active   QUEtiapine (SEROQUEL) 100 MG tablet [id number] Yes Take 1.5 tablets (150 mg total) by mouth at bedtime. [name], MD Taking Active   rOPINIRole (REQUIP) 0.5 MG tablet [id number] Yes Take 0.5 mg by mouth 4 (four) times daily as needed. [provider] Taking Active   Sennosides [facility] LAX PO) [id number] Yes Take 2 tablets by mouth at bedtime. [provider] Taking Active   SPIRIVA HANDIHALER 18 MCG inhalation capsule [id number] Yes PLACE 1 CAPSULE INTO INHALER AND INHALE DAILY. [name], MD Taking Active   tiZANidine (ZANAFLEX) 4 MG tablet [id number] Yes TAKE 1 TABLET BY MOUTH EVERY 8 HOURS AS NEEDED [name], MD Taking Active   valACYclovir (VALTREX) 500 MG tablet [id number] Yes Take 1 tablet (500 mg total) by mouth daily. [name], MD Taking Active   venlafaxine XR (EFFEXOR-XR) 150 MG 24 hr capsule [id number] Yes TAKE 2 CAPSULES BY MOUTH DAILY. [name], MD Taking Active   Med List Note Payton Doughty, CPhT 07/26/15 1253): Pain management with Dr. Alger Simons          Patient Active Problem List   Diagnosis Date Noted  . Acute gout 06/20/2020  . Interstitial cystitis 01/21/2020  . Status post shoulder surgery 09/09/2019  . Nausea 08/19/2019  . Anxiety  05/04/2019  . Insomnia 05/04/2019  . Syncope 12/08/2018  . Disorder of rotator cuff syndrome of left shoulder and allied disorder 06/22/2018  . Bilateral leg edema 06/04/2018  . Type 2 diabetes mellitus without complication, without long-term current use of insulin ([place]) 12/02/2017  . Sacral pain 09/17/2017  . COPD (chronic obstructive pulmonary disease) ([place]) 04/21/2017  . Lower back pain 03/03/2017  . CKD (chronic kidney disease) stage 3, GFR 30-59 ml/min (HCC) 08/07/2016  . GERD (gastroesophageal reflux disease) 08/07/2016  . Chronic respiratory failure ([place]) 07/29/2016  . Arthritis of carpometacarpal [facility]) joint of left thumb 07/09/2016  . Pneumothorax on left 01/31/2016  . Chronic, continuous use of opioids 09/06/2015  . Degenerative disc disease, lumbar 08/01/2015    Class: Chronic  . Spondylolisthesis of lumbar region 08/01/2015    Class: Chronic  . Urinary, incontinence, stress female 05/06/2014  . Constipation 05/05/2014  . 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 HTN (hypertension) 05/19/2013  . HLD (hyperlipidemia) 05/19/2013  . Depression 05/19/2013  . Hypothyroidism 05/19/2013  . Tobacco abuse   . Osteoarthritis of both knees 11/18/2011  . RLS (restless legs syndrome) 11/18/2011    Immunization History  Administered Date(s) Administered  . Influenza, Seasonal, Injecte, Preservative Fre 08/05/2012  . Influenza,inj,Quad PF,6+ Mos 05/05/2014, 05/22/2015, 07/29/2016, 06/04/2018  . Influenza-Unspecified 07/16/2017, 07/30/2020  . Moderna Sars-Covid-2 Vaccination 01/13/2020, 07/30/2020  . Pneumococcal Conjugate-13 12/02/2017  . Tdap 07/14/2013    Conditions to be addressed/monitored:  HTN, HLD and COPD  Patient Care Plan: CCM Pharmacy Care Plan    Problem Identified: HTN, HLD, Depression/Anxiety, COPD, Tobacco Use, Chronic Pain   Priority: High    Goal:  Patient-Specific Goal   Start Date: 09/29/2020  Expected End Date: 03/29/2021  This Visit's Progress: On track  Priority:  High  Note:   Current Barriers:  . Unable to independently monitor therapeutic efficacy . Unable to achieve control of BP   Pharmacist Clinical Goal(s):  [name] Over the next 60 days, patient will achieve adherence to monitoring guidelines and medication adherence to achieve therapeutic efficacy . achieve control of BP as evidenced by home BP readings through collaboration with PharmD and provider.   Interventions: . 1:1 collaboration with [name], MD regarding development and update of comprehensive plan of care as evidenced by provider attestation and co-signature . Inter-disciplinary care team collaboration (see longitudinal plan of care) . Comprehensive medication review performed; medication list updated in electronic medical record  Hypertension (BP goal <130/80) -uncontrolled - pt never started hydralazine (rx'd in Oct 2021) -Current treatment: . Amlodipine-benazepril 10-40 mg daily -Medications previously tried: clonidine, HCTZ, propranolol -Current home BP readings: 140/88 -Denies hypotensive/hypertensive symptoms -Educated on BP goals and benefits of medications for prevention of heart attack, stroke and kidney damage; Importance of home blood pressure monitoring; -Counseled to monitor BP at home 2-3 times weekly, document, and provide log at future appointments -Counseled on diet and exercise extensively Recommended to continue current medication  -Patient has cardiology appt next week, will defer med changes  Hyperlipidemia: (LDL goal < 100) -uncontrolled -Current treatment: . Atorvastatin 20 mg daily -Medications previously tried: pravastatin, gemfibrozil -Educated on Cholesterol goals;  Benefits of statin for ASCVD risk reduction; Importance of limiting foods high in cholesterol; -Counseled on diet and exercise extensively Recommended to continue current medication  COPD (Goal: control symptoms and prevent exacerbations) -controlled -Current treatment   . Spiriva Handihaler 54mg 1 puff daily . Albuterol HFA prn -Gold Grade: Gold 1 (FEV1>80%) -Current COPD Classification:  A (low sx, <2 exacerbations/yr) -Pulmonary function testing: 2015 - FEV1 88% predicted, FEV/FVC 0.86 -Exacerbations requiring treatment in last 6 months: 1 -Patient reports consistent use of maintenance inhaler -Frequency of rescue inhaler use: rare -Counseled on Proper inhaler technique; Benefits of consistent maintenance inhaler use When to use rescue inhaler -Recommended to continue current medication  Depression/Anxiety/Insomnia (Goal: manage symptoms) -uncontrolled - family stress and health concerns are causing her issues currently -Current treatment: . Venlafaxine ER 150 mg - 2 cap daily . Quetiapine 100 mg - 1.5 tab daily HS . Clonazepam 1 mg BID prn -Medications previously tried/failed: zolpidem -PHQ9: 2 (04/2020) - sleeping -GAD: not on file -Connected with PCP for mental health support -Educated on Benefits of medication for symptom control  -Counseled on differences between sertraline and venlafaxine; pain mgmt doctor mentioned sertraline may be better for her, however pt is not willing to taper off of venlafaxine -Recommended to continue current medication  Chronic Pain (Goal: manage symptoms) -osteoarthritis (knees), lumbar DDD, rotator cuff disorder -pain management: Dr FRoyce Macadamia-controlled -Current treatment  . Fentanyl 37.5 mcg/72 hr patch . Hydrocodone-APAP 10-325 mg q6h PRN . Tylenol 500 mg PRN . Gabapentin 400 mg TID . Tizanidine 4 mg q8h PRN . Ropinirole 0.5 mg QID prn -Patient is satisfied with current regimen and denies issues -Recommended to continue current medication  Tobacco use (Goal quit smoking) -uncontrolled - pt not willing to quit right now due to life stressors -Current treatment  . Nicotine patch 21 mg/25hr -Patient smokes 1.25 ppd x 40 years -Patient triggers include: stress - reports MOTIVATION to quit is  moderate -reports CONFIDENCE in quitting is low  -Previous quit attempts:  Chantix -Counseled on benefits of using nicotine patch + gum in combination to fight cravings  Patient Goals/Self-Care Activities . Over the next 60 days, patient will:  - take medications as prescribed Keep upcoming appts with Cardiology and Pulmonology  Follow Up Plan: Telephone follow up appointment with care management team member scheduled for: 2 months     Medication Assistance: None required.  Patient affirms current coverage meets needs.  Patient's preferred pharmacy is:  [facility], [address] [place] [address] Phone: [phone] Fax: [phone]  Uses pill box? Yes Pt endorses 100% compliance  We discussed: Current pharmacy is preferred with insurance plan and patient is satisfied with pharmacy services - pharmacy delivery  Plan: Continue current medication management strategy    Follow Up:  Patient agrees to Care Plan and Follow-up.  Plan: Telephone follow up appointment with care management team member scheduled for:  2 months  Charlene Brooke, PharmD, [facility] Clinical Pharmacist [facility] [phone]

## 2020-09-29 NOTE — Patient Instructions (Signed)
Visit Information  Phone number for Pharmacist: [phone]  Goals Addressed            This Visit's Progress   . Stop or Cut Down Tobacco Use       Timeframe:  Long-Range Goal Priority:  High Start Date:      09/19/20                       Expected End Date:     03/29/21                   - change or avoid triggers like smoky places, drinking alcohol and other smokers - cut down number of cigarettes by one-half - use over-the-counter gum, patch or lozenges - use Quit Line 1-800-Quit Now    Why is this important?    To stop or cut down it is important to have support from a person or group of people who you can count on.   You will also need to think about the things that make you feel like smoking, then plan for how to handle them.     . Track and Manage My Blood Pressure-Hypertension       Timeframe:  Long-Range Goal Priority:  High Start Date:       09/29/20                      Expected End Date:   03/29/21                     - check blood pressure 3 times per week - choose a place to take my blood pressure (home, clinic or office, retail store) - write blood pressure results in a log or diary    Why is this important?    You won't feel high blood pressure, but it can still hurt your blood vessels.   High blood pressure can cause heart or kidney problems. It can also cause a stroke.   Making lifestyle changes like losing a little weight or eating less salt will help.   Checking your blood pressure at home and at different times of the day can help to control blood pressure.   If the doctor prescribes medicine remember to take it the way the doctor ordered.   Call the office if you cannot afford the medicine or if there are questions about it.        Patient Care Plan: CCM Pharmacy Care Plan    Problem Identified: HTN, HLD, Depression/Anxiety, COPD, Tobacco Use, Chronic Pain   Priority: High    Goal: Patient-Specific Goal   Start Date: 09/29/2020  Expected  End Date: 03/29/2021  This Visit's Progress: On track  Priority: High  Note:   Current Barriers:  . Unable to independently monitor therapeutic efficacy . Unable to achieve control of BP   Pharmacist Clinical Goal(s):  [name] Over the next 60 days, patient will achieve adherence to monitoring guidelines and medication adherence to achieve therapeutic efficacy . achieve control of BP as evidenced by home BP readings through collaboration with PharmD and provider.   Interventions: . 1:1 collaboration with [name], MD regarding development and update of comprehensive plan of care as evidenced by provider attestation and co-signature . Inter-disciplinary care team collaboration (see longitudinal plan of care) . Comprehensive medication review performed; medication list updated in electronic medical record  Hypertension (BP goal <130/80) -uncontrolled - pt never started hydralazine (  rx'd in Oct 2021) -Current treatment: . Amlodipine-benazepril 10-40 mg daily -Medications previously tried: clonidine, HCTZ, propranolol -Current home BP readings: 140/88 -Denies hypotensive/hypertensive symptoms -Educated on BP goals and benefits of medications for prevention of heart attack, stroke and kidney damage; Importance of home blood pressure monitoring; -Counseled to monitor BP at home 2-3 times weekly, document, and provide log at future appointments -Counseled on diet and exercise extensively Recommended to continue current medication  -Patient has cardiology appt next week, will defer med changes  Hyperlipidemia: (LDL goal < 100) -uncontrolled -Current treatment: . Atorvastatin 20 mg daily -Medications previously tried: pravastatin, gemfibrozil -Educated on Cholesterol goals;  Benefits of statin for ASCVD risk reduction; Importance of limiting foods high in cholesterol; -Counseled on diet and exercise extensively Recommended to continue current medication  COPD (Goal: control symptoms  and prevent exacerbations) -controlled -Current treatment  . Spiriva Handihaler 50mcg 1 puff daily . Albuterol HFA prn -Gold Grade: Gold 1 (FEV1>80%) -Current COPD Classification:  A (low sx, <2 exacerbations/yr) -Pulmonary function testing: 2015 - FEV1 88% predicted, FEV/FVC 0.86 -Exacerbations requiring treatment in last 6 months: 1 -Patient reports consistent use of maintenance inhaler -Frequency of rescue inhaler use: rare -Counseled on Proper inhaler technique; Benefits of consistent maintenance inhaler use When to use rescue inhaler -Recommended to continue current medication  Depression/Anxiety/Insomnia (Goal: manage symptoms) -uncontrolled - family stress and health concerns are causing her issues currently -Current treatment: . Venlafaxine ER 150 mg - 2 cap daily . Quetiapine 100 mg - 1.5 tab daily HS . Clonazepam 1 mg BID prn -Medications previously tried/failed: zolpidem -PHQ9: 2 (04/2020) - sleeping -GAD: not on file -Connected with PCP for mental health support -Educated on Benefits of medication for symptom control  -Counseled on differences between sertraline and venlafaxine; pain mgmt doctor mentioned sertraline may be better for her, however pt is not willing to taper off of venlafaxine -Recommended to continue current medication  Chronic Pain (Goal: manage symptoms) -osteoarthritis (knees), lumbar DDD, rotator cuff disorder -pain management: Dr Royce Macadamia -controlled -Current treatment  . Fentanyl 37.5 mcg/72 hr patch . Hydrocodone-APAP 10-325 mg q6h PRN . Tylenol 500 mg PRN . Gabapentin 400 mg TID . Tizanidine 4 mg q8h PRN . Ropinirole 0.5 mg QID prn -Patient is satisfied with current regimen and denies issues -Recommended to continue current medication  Tobacco use (Goal quit smoking) -uncontrolled - pt not willing to quit right now due to life stressors -Current treatment  . Nicotine patch 21 mg/25hr -Patient smokes 1.25 ppd x 40 years -Patient  triggers include: stress - reports MOTIVATION to quit is moderate -reports CONFIDENCE in quitting is low  -Previous quit attempts: Chantix -Counseled on benefits of using nicotine patch + gum in combination to fight cravings  Patient Goals/Self-Care Activities . Over the next 60 days, patient will:  - take medications as prescribed Keep upcoming appts with Cardiology and Pulmonology  Follow Up Plan: Telephone follow up appointment with care management team member scheduled for: 2 months     The patient verbalized understanding of instructions, educational materials, and care plan provided today and declined offer to receive copy of patient instructions, educational materials, and care plan.  Telephone follow up appointment with pharmacy team member scheduled for: 2 months  Charlene Brooke, PharmD, [facility] Clinical Pharmacist [facility] [phone]

## 2020-10-09 ENCOUNTER — Other Ambulatory Visit: Payer: Self-pay | Admitting: Internal Medicine

## 2020-10-10 DIAGNOSIS — M545 Low back pain, unspecified: Secondary | ICD-10-CM | POA: Diagnosis not present

## 2020-10-10 DIAGNOSIS — F17210 Nicotine dependence, cigarettes, uncomplicated: Secondary | ICD-10-CM | POA: Diagnosis not present

## 2020-10-10 DIAGNOSIS — J439 Emphysema, unspecified: Secondary | ICD-10-CM | POA: Diagnosis not present

## 2020-10-19 ENCOUNTER — Other Ambulatory Visit: Payer: Self-pay | Admitting: Specialist

## 2020-10-25 DIAGNOSIS — M545 Low back pain, unspecified: Secondary | ICD-10-CM | POA: Diagnosis not present

## 2020-10-25 DIAGNOSIS — Z79899 Other long term (current) drug therapy: Secondary | ICD-10-CM | POA: Diagnosis not present

## 2020-10-25 DIAGNOSIS — R9431 Abnormal electrocardiogram [ECG] [EKG]: Secondary | ICD-10-CM | POA: Diagnosis not present

## 2020-11-01 ENCOUNTER — Telehealth: Payer: Medicare Other

## 2020-11-01 NOTE — Progress Notes (Deleted)
Chronic Care Management Pharmacy Note  11/01/2020 Name:  Jill Phillips MRN:  [id number] DOB:  May 14, 1956  Subjective: Jill Phillips is an 65 y.o. year old female who is a primary patient of Burns, Claudina Lick, MD.  The CCM team was consulted for assistance with disease management and care coordination needs.   Engaged with patient by telephone for follow up visit in response to provider referral for pharmacy case management and/or care coordination services.   Consent to Services:  The patient was given the following information about Chronic Care Management services today, agreed to services, and gave verbal consent: 1. CCM service includes personalized support from designated clinical staff supervised by the primary care provider, including individualized plan of care and coordination with other care providers 2. 24/7 contact phone numbers for assistance for urgent and routine care needs. 3. Service will only be billed when office clinical staff spend 20 minutes or more in a month to coordinate care. 4. Only one practitioner may furnish and bill the service in a calendar month. 5.The patient may stop CCM services at any time (effective at the end of the month) by phone call to the office staff. 6. The patient will be responsible for cost sharing (co-pay) of up to 20% of the service fee (after annual deductible is met). Patient agreed to services and consent obtained.  Patient Care Team: [name], MD as PCP - General (Internal Medicine) Charlton Haws, [facility] as Pharmacist (Pharmacist)  Patient's daughter lives with her and "is not doing what she's supposed to" and this causes her a lot of stress. Patient saw pain management doctor Jan 2022, they did a chest Xray and found an infection, gave her antibiotic and steroid injections but did not prescribe oral medications. 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 Pt reports she is very stressed about all of this as well.  Recent office visits: 06/28/20 pt call - switched zolpidem back to seroquel at pt's request.  06/20/20 Dr Quay Burow OV: chronic f/u - c/o urinary frequency, LE swelling, toe pain, depression, BP elevated d/t stress, insomnia. Acute gout - rx'd prednisone taper. Insomnia - dc'd seroquel, started Zolpidem 5 mg. Added hydralazine 25 mg TID for BP.  11/16/19 Dr Quay Burow OV: recurrent UTI, rx Augmentin and fluconazole. Urine cx came back contaminated, not likely true UTI. Referred to urology for continued bladder pain.  Recent consult visits: 08/24/20 NP Justin Mend ([facility]): referred to GI for epigastric pain, emesis.  08/11/20 Dr [name] (orthopedic surg): f/u DDD. No med changes.  03/17/20 Dr Primus Bravo (pain mgmt): f/u chronic pain.  11/15/19 Dr Marlou Sa (orthopedic surg): bilateral thumb pain d/t arthritis. Normal X-ray of shoulder. Plan for thumb injections at f/u.  10/13/19 Dr Marlou Sa (orthopedic surg): Shoulder surgery 09/09/19.  No issues, RTC 4 wks.  Objective:  Lab Results  Component Value Date   CREATININE 1.09 06/20/2020   BUN 13 06/20/2020   GFR 53.09 (L) 06/20/2020   GFRNONAA 51 (L) 09/07/2019   GFRAA 59 (L) 09/07/2019   NA 142 06/20/2020   K 3.8 06/20/2020   CALCIUM 9.1 06/20/2020   CO2 30 06/20/2020    Lab Results  Component Value Date/Time   HGBA1C 6.6 (H) 06/20/2020 12:15 PM   HGBA1C 6.2 01/21/2020 03:02 PM   GFR 53.09 (L) 06/20/2020 12:15 PM   GFR 49.86 (L) 01/21/2020 03:02 PM    Last diabetic Eye exam:  Lab Results  Component Value Date/Time   HMDIABEYEEXA No Retinopathy 12/09/2017 12:31 PM    Last diabetic Foot exam: No results found for: HMDIABFOOTEX   Lab Results  Component Value Date   CHOL 209 (H) 01/21/2020   HDL 44.00 01/21/2020   LDLCALC 86 11/03/2018   LDLDIRECT 124.0 01/21/2020   TRIG 219.0 (H) 01/21/2020   CHOLHDL 5 01/21/2020    Hepatic Function Latest Ref Rng & Units 06/20/2020  01/21/2020 12/03/2018  Total Protein 6.0 - 8.3 g/dL 7.6 7.9 8.0  Albumin 3.5 - 5.2 g/dL 4.5 4.6 4.5  AST 0 - 37 U/L 15 17 33  ALT 0 - 35 U/L 8 10 40  Alk Phosphatase 39 - 117 U/L 145(H) 162(H) 126  Total Bilirubin 0.2 - 1.2 mg/dL 0.3 0.4 0.5    Lab Results  Component Value Date/Time   TSH 3.65 06/20/2020 12:15 PM   TSH 1.04 01/21/2020 03:02 PM   FREET4 0.91 05/22/2015 03:52 PM   FREET4 0.88 01/18/2015 12:36 PM    CBC Latest Ref Rng & Units 06/20/2020 01/21/2020 09/07/2019  WBC 4.0 - 10.5 K/uL 8.3 7.7 7.8  Hemoglobin 12.0 - 15.0 g/dL 13.1 13.8 13.3  Hematocrit 36.0 - 46.0 % 39.5 42.1 41.3  Platelets 150.0 - 400.0 K/uL 235.0 291.0 264   No results found for: VD25OH  Clinical ASCVD: No  The 10-year ASCVD risk score Mikey Bussing DC Jr., et al., 2013) is: 37.5%   Values used to calculate the score:     Age: 63 years     Sex: Female     Is Non-Hispanic African American: No     Diabetic: Yes     Tobacco smoker: Yes     Systolic Blood Pressure: 237 mmHg     Is BP treated: Yes     HDL Cholesterol: 44 mg/dL     Total Cholesterol: 209 mg/dL    Depression screen [facility] 2/9 08/24/2020 04/04/2020 12/09/2018  Decreased Interest 0 0 0  Down, Depressed, Hopeless 0 0 -  PHQ - 2 Score 0 0 0  Altered sleeping - 2 -  Tired, decreased energy - 0 -  Change in appetite - 0 -  Feeling bad or failure about yourself  - 0 -  Trouble concentrating - 0 -  Moving slowly or fidgety/restless - 0 -  Suicidal thoughts - 0 -  PHQ-9 Score - 2 -  Difficult doing work/chores - Not difficult at all -  Some recent data might be hidden   No flowsheet data found.  Social History   Tobacco Use  Smoking Status Current Every Day Smoker  . Packs/day: 1.25  . Years: 45.00  . Pack years: 56.25  . Types: Cigarettes  Smokeless Tobacco Never Used  Tobacco Comment   form given 12/25/16    BP Readings from Last 3 Encounters:  08/11/20 (!) 156/91  06/20/20 (!) 166/86  04/04/20 (!) 148/84   Pulse Readings from Last 3  Encounters:  08/11/20 (!) 103  06/20/20 (!) 104  04/04/20 87   Wt Readings from Last 3 Encounters:  08/11/20 156 lb (70.8 kg)  06/20/20 156 lb (70.8 kg)  04/04/20 152 lb (68.9 kg)    Assessment/Interventions: Review of patient past medical history, allergies, medications, health status, including review of consultants reports, laboratory and other test data, was performed as part of comprehensive evaluation and provision of chronic care management services.   SDOH:  (Social Determinants of Health) assessments and interventions performed:    CCM Care  Plan  Allergies  Allergen Reactions  . Flagyl [Metronidazole] Diarrhea  . Limonene Itching    Yeast infection in mouth  . Sulfa Antibiotics Other (See Comments)    Yeast infection in mouth  . Doxycycline     Mouth soreness - reaction vs thrush?  [name] Amoxicillin Other (See Comments)    REACTION: Oral yeast infection Has patient had a PCN reaction causing immediate rash, facial/tongue/throat swelling, SOB or lightheadedness with hypotension: No Has patient had a PCN reaction causing severe rash involving mucus membranes or skin necrosis: No Has patient had a PCN reaction that required hospitalization No Has patient had a PCN reaction occurring within the last 10 years: No If all of the above answers are "NO", then may proceed with Cephalosporin use.   . Chlorzoxazone Other (See Comments)    headache  . Codeine Other (See Comments)    headache  . Darvocet [Propoxyphene N-Acetaminophen] Itching  . Dilaudid [Hydromorphone Hcl] Itching  . Keflex [Cephalexin] Other (See Comments)    Pt does not recall reaction (maybe yeast infection)  . Morphine And Related Itching  . Nitrofurantoin Monohyd Macro Hives    Reaction to [employer]  . Percocet [Oxycodone-Acetaminophen] Itching    Patient can tolerate Acetaminophen solely  . Trazodone And Nefazodone Palpitations    Medications Reviewed Today    Reviewed by Charlton Haws, [facility], [place]  (Pharmacist) on 09/29/20 at [place] List Status: <None>  Medication Order Taking? Sig Documenting Provider Last Dose Status Informant  acetaminophen (TYLENOL) 500 MG tablet [id number] Yes Take 1,000 mg by mouth every 6 (six) hours as needed (for headaches.). [provider] Taking Active Self  albuterol (PROAIR HFA) 108 (90 Base) MCG/ACT inhaler [id number] Yes INHALE 1 PUFF INTO THE LUNGS EVERY 6 HOURS AS NEEDED FOR WHEEZING OR SHORTNESS OF BREATH.NEED OFFICE VISIT FOR FURTHER REFILLS  Patient taking differently: Inhale 1 puff into the lungs every 6 (six) hours as needed for wheezing or shortness of breath.   Flossie Buffy, NP Taking Active   amLODipine-benazepril (LOTREL) 10-40 MG capsule [id number] Yes TAKE 1 CAPSULE BY MOUTH DAILY. [name], MD Taking Active   atorvastatin (LIPITOR) 20 MG tablet [id number] Yes TAKE 1 TABLET BY MOUTH DAILY. [name], MD Taking Active   cimetidine (TAGAMET) 400 MG tablet [id number] Yes Take 400 mg by mouth 2 (two) times daily. [provider] Taking Active Self  clonazePAM (KLONOPIN) 1 MG tablet [id number] Yes TAKE 1 TABLET BY MOUTH 2 TIMES DAILY AS NEEDED FOR ANXIETY Burns, Claudina Lick, MD Taking Active   docusate sodium (COLACE) 100 MG capsule [id number] Yes Take 200 mg by mouth at bedtime. [provider] Taking Active Self  fentaNYL 37.5 MCG/HR PT72 [id number] Yes Apply 1 patch topically every 3 (three) days. [provider] Taking Active   gabapentin (NEURONTIN) 400 MG capsule [id number] Yes TAKE 1 CAPSULE BY MOUTH 3 TIMES DAILY. [name], MD Taking Active   HYDROcodone-acetaminophen [facility]) 10-325 MG tablet [id number] Yes Take 1 tablet by mouth every 6 (six) hours as needed for severe pain (Chronic back pain). [name], MD Taking Active   levothyroxine (SYNTHROID) 88 MCG tablet [id number] Yes TAKE 1 TABLET BY MOUTH DAILY. [name], MD Taking Active   Methen-Hyosc-Meth Blue-Na Phos (ME/NAPHOS/MB/HYO1)  81.6 MG TABS [id number] Yes Take 1 tablet by mouth 3 (three) times daily. [provider] Taking Active   NARCAN 4 MG/0.1ML LIQD nasal spray kit [id number] Yes Place 1 spray into the  nose as needed (accidental overdose). [name], MD Taking Active   nicotine (NICODERM CQ - DOSED IN MG/24 HOURS) 21 mg/24hr patch [id number] Yes Place 1 patch (21 mg total) onto the skin daily. [name], MD Taking Active   omeprazole (PRILOSEC) 40 MG capsule [id number] Yes TAKE 1 CAPSULE BY MOUTH 2 TIMES DAILY BEFORE A MEAL. [name], MD Taking Active   QUEtiapine (SEROQUEL) 100 MG tablet [id number] Yes Take 1.5 tablets (150 mg total) by mouth at bedtime. [name], MD Taking Active   rOPINIRole (REQUIP) 0.5 MG tablet [id number] Yes Take 0.5 mg by mouth 4 (four) times daily as needed. [provider] Taking Active   Sennosides [facility] LAX PO) [id number] Yes Take 2 tablets by mouth at bedtime. [provider] Taking Active   SPIRIVA HANDIHALER 18 MCG inhalation capsule [id number] Yes PLACE 1 CAPSULE INTO INHALER AND INHALE DAILY. [name], MD Taking Active   tiZANidine (ZANAFLEX) 4 MG tablet [id number] Yes TAKE 1 TABLET BY MOUTH EVERY 8 HOURS AS NEEDED [name], MD Taking Active   valACYclovir (VALTREX) 500 MG tablet [id number] Yes Take 1 tablet (500 mg total) by mouth daily. [name], MD Taking Active   venlafaxine XR (EFFEXOR-XR) 150 MG 24 hr capsule [id number] Yes TAKE 2 CAPSULES BY MOUTH DAILY. [name], MD Taking Active   Med List Note Payton Doughty, CPhT 07/26/15 1253): Pain management with Dr. Alger Simons          Patient Active Problem List   Diagnosis Date Noted  . Acute gout 06/20/2020  . Interstitial cystitis 01/21/2020  . Status post shoulder surgery 09/09/2019  . Nausea 08/19/2019  . Anxiety 05/04/2019  . Insomnia 05/04/2019  . Syncope 12/08/2018  . Disorder of rotator cuff syndrome of left shoulder and allied disorder 06/22/2018   . Bilateral leg edema 06/04/2018  . Type 2 diabetes mellitus without complication, without long-term current use of insulin ([place]) 12/02/2017  . Sacral pain 09/17/2017  . COPD (chronic obstructive pulmonary disease) ([place]) 04/21/2017  . Lower back pain 03/03/2017  . CKD (chronic kidney disease) stage 3, GFR 30-59 ml/min (H[facility]) 08/07/2016  . GERD (gastroesophageal reflux disease) 08/07/2016  . Chronic respiratory failure ([place]) 07/29/2016  . Arthritis of carpometacarpal [facility]) joint of left thumb 07/09/2016  . Pneumothorax on left 01/31/2016  . Chronic, continuous use of opioids 09/06/2015  . Degenerative disc disease, lumbar 08/01/2015    Class: Chronic  . Spondylolisthesis of lumbar region 08/01/2015    Class: Chronic  . Urinary, incontinence, stress female 05/06/2014  . Constipation 05/05/2014  . 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 HTN (hypertension) 05/19/2013  . HLD (hyperlipidemia) 05/19/2013  . Depression 05/19/2013  . Hypothyroidism 05/19/2013  . Tobacco abuse   . Osteoarthritis of both knees 11/18/2011  . RLS (restless legs syndrome) 11/18/2011    Immunization History  Administered Date(s) Administered  . Influenza, Seasonal, Injecte, Preservative Fre 08/05/2012  . Influenza,inj,Quad PF,6+ Mos 05/05/2014, 05/22/2015, 07/29/2016, 06/04/2018  . Influenza-Unspecified 07/16/2017, 07/30/2020  . Moderna Sars-Covid-2 Vaccination 01/13/2020, 07/30/2020  . Pneumococcal Conjugate-13 12/02/2017  . Tdap 07/14/2013    Conditions to be addressed/monitored:  HTN, HLD and COPD  Patient Care Plan: CCM Pharmacy Care Plan    Problem Identified: HTN, HLD, Depression/Anxiety, COPD, Tobacco Use, Chronic Pain   Priority: High    Goal: Patient-Specific Goal   Start Date: 09/29/2020  Expected End Date: 03/29/2021  This Visit's Progress: On track  Priority: High  Note:  Current Barriers:  . Unable to independently monitor therapeutic efficacy . Unable to achieve control of BP   Pharmacist  Clinical Goal(s):  [name] Over the next 60 days, patient will achieve adherence to monitoring guidelines and medication adherence to achieve therapeutic efficacy . achieve control of BP as evidenced by home BP readings through collaboration with PharmD and provider.   Interventions: . 1:1 collaboration with [name], MD regarding development and update of comprehensive plan of care as evidenced by provider attestation and co-signature . Inter-disciplinary care team collaboration (see longitudinal plan of care) . Comprehensive medication review performed; medication list updated in electronic medical record  Hypertension (BP goal <130/80) -uncontrolled - pt never started hydralazine (rx'd in Oct 2021) -Current treatment: . Amlodipine-benazepril 10-40 mg daily -Medications previously tried: clonidine, HCTZ, propranolol -Current home BP readings: 140/88 -Denies hypotensive/hypertensive symptoms -Educated on BP goals and benefits of medications for prevention of heart attack, stroke and kidney damage; Importance of home blood pressure monitoring; -Counseled to monitor BP at home 2-3 times weekly, document, and provide log at future appointments -Counseled on diet and exercise extensively Recommended to continue current medication  -Patient has cardiology appt next week, will defer med changes  Hyperlipidemia: (LDL goal < 100) -uncontrolled -Current treatment: . Atorvastatin 20 mg daily -Medications previously tried: pravastatin, gemfibrozil -Educated on Cholesterol goals;  Benefits of statin for ASCVD risk reduction; Importance of limiting foods high in cholesterol; -Counseled on diet and exercise extensively Recommended to continue current medication  COPD (Goal: control symptoms and prevent exacerbations) -controlled -Current treatment  . Spiriva Handihaler 74mg 1 puff daily . Albuterol HFA prn -Gold Grade: Gold 1 (FEV1>80%) -Current COPD Classification:  A (low sx, <2  exacerbations/yr) -Pulmonary function testing: 2015 - FEV1 88% predicted, FEV/FVC 0.86 -Exacerbations requiring treatment in last 6 months: 1 -Patient reports consistent use of maintenance inhaler -Frequency of rescue inhaler use: rare -Counseled on Proper inhaler technique; Benefits of consistent maintenance inhaler use When to use rescue inhaler -Recommended to continue current medication  Depression/Anxiety/Insomnia (Goal: manage symptoms) -uncontrolled - family stress and health concerns are causing her issues currently -Current treatment: . Venlafaxine ER 150 mg - 2 cap daily . Quetiapine 100 mg - 1.5 tab daily HS . Clonazepam 1 mg BID prn -Medications previously tried/failed: zolpidem -PHQ9: 2 (04/2020) - sleeping -GAD: not on file -Connected with PCP for mental health support -Educated on Benefits of medication for symptom control  -Counseled on differences between sertraline and venlafaxine; pain mgmt doctor mentioned sertraline may be better for her, however pt is not willing to taper off of venlafaxine -Recommended to continue current medication  Chronic Pain (Goal: manage symptoms) -osteoarthritis (knees), lumbar DDD, rotator cuff disorder -pain management: Dr FRoyce Macadamia-controlled -Current treatment  . Fentanyl 37.5 mcg/72 hr patch . Hydrocodone-APAP 10-325 mg q6h PRN . Tylenol 500 mg PRN . Gabapentin 400 mg TID . Tizanidine 4 mg q8h PRN . Ropinirole 0.5 mg QID prn -Patient is satisfied with current regimen and denies issues -Recommended to continue current medication  Tobacco use (Goal quit smoking) -uncontrolled - pt not willing to quit right now due to life stressors -Current treatment  . Nicotine patch 21 mg/25hr -Patient smokes 1.25 ppd x 40 years -Patient triggers include: stress - reports MOTIVATION to quit is moderate -reports CONFIDENCE in quitting is low  -Previous quit attempts: Chantix -Counseled on benefits of using nicotine patch + gum in  combination to fight cravings  Patient Goals/Self-Care Activities . Over the next 60 days,  patient will:  - take medications as prescribed Keep upcoming appts with Cardiology and Pulmonology  Follow Up Plan: Telephone follow up appointment with care management team member scheduled for: 2 months     Medication Assistance: None required.  Patient affirms current coverage meets needs.  Patient's preferred pharmacy is:  [place], [place] [address] Phone: [phone] Fax: [phone]  Uses pill box? Yes Pt endorses 100% compliance  We discussed: Current pharmacy is preferred with insurance plan and patient is satisfied with pharmacy services - pharmacy delivery  Plan: Continue current medication management strategy    Follow Up:  Patient agrees to Care Plan and Follow-up.  Plan: Telephone follow up appointment with care management team member scheduled for:  2 months  Charlene Brooke, PharmD, [facility] Clinical Pharmacist [facility] [phone]

## 2020-11-06 ENCOUNTER — Other Ambulatory Visit: Payer: Self-pay | Admitting: Internal Medicine

## 2020-11-07 ENCOUNTER — Telehealth: Payer: Self-pay | Admitting: Pharmacist

## 2020-11-08 NOTE — Progress Notes (Signed)
Chronic Care Management Pharmacy Assistant   Name: Jill Phillips  MRN: [id number] DOB: 06/24/1956   Reason for Encounter: Hypertension Adherence Call   Conditions to be addressed/monitored: HTN   Recent office visits:  None  Recent consult visits:  None  Hospital visits:  None in previous 6 months  Medications: Outpatient Encounter Medications as of 11/07/2020  Medication Sig   acetaminophen (TYLENOL) 500 MG tablet Take 1,000 mg by mouth every 6 (six) hours as needed (for headaches.).   albuterol (PROAIR HFA) 108 (90 Base) MCG/ACT inhaler INHALE 1 PUFF INTO THE LUNGS EVERY 6 HOURS AS NEEDED FOR WHEEZING OR SHORTNESS OF BREATH.NEED OFFICE VISIT FOR FURTHER REFILLS (Patient taking differently: Inhale 1 puff into the lungs every 6 (six) hours as needed for wheezing or shortness of breath.)   amLODipine-benazepril (LOTREL) 10-40 MG capsule TAKE 1 CAPSULE BY MOUTH DAILY.   atorvastatin (LIPITOR) 20 MG tablet TAKE 1 TABLET BY MOUTH DAILY.   cimetidine (TAGAMET) 400 MG tablet Take 400 mg by mouth 2 (two) times daily.   clonazePAM (KLONOPIN) 1 MG tablet TAKE 1 TABLET BY MOUTH 2 TIMES DAILY AS NEEDED FOR ANXIETY   docusate sodium (COLACE) 100 MG capsule Take 200 mg by mouth at bedtime.   fentaNYL 37.5 MCG/HR PT72 Apply 1 patch topically every 3 (three) days.   gabapentin (NEURONTIN) 400 MG capsule TAKE 1 CAPSULE BY MOUTH 3 TIMES DAILY.   HYDROcodone-acetaminophen (NORCO) 10-325 MG tablet Take 1 tablet by mouth every 6 (six) hours as needed for severe pain (Chronic back pain).   levothyroxine (SYNTHROID) 88 MCG tablet TAKE 1 TABLET BY MOUTH DAILY.   Methen-Hyosc-Meth Blue-Na Phos (ME/NAPHOS/MB/HYO1) 81.6 MG TABS Take 1 tablet by mouth 3 (three) times daily.   NARCAN 4 MG/0.1ML LIQD nasal spray kit Place 1 spray into the nose as needed (accidental overdose).   nicotine (NICODERM CQ - DOSED IN MG/24 HOURS) 21 mg/24hr patch Place 1 patch (21 mg total) onto the skin daily.    omeprazole (PRILOSEC) 40 MG capsule TAKE 1 CAPSULE BY MOUTH 2 TIMES DAILY BEFORE A MEAL.   QUEtiapine (SEROQUEL) 100 MG tablet Take 1.5 tablets (150 mg total) by mouth at bedtime.   rOPINIRole (REQUIP) 0.5 MG tablet Take 0.5 mg by mouth 4 (four) times daily as needed.   Sennosides (SENNA LAX PO) Take 2 tablets by mouth at bedtime.   SPIRIVA HANDIHALER 18 MCG inhalation capsule PLACE 1 CAPSULE INTO INHALER AND INHALE DAILY.   tiZANidine (ZANAFLEX) 4 MG tablet TAKE 1 TABLET BY MOUTH EVERY 8 HOURS AS NEEDED   valACYclovir (VALTREX) 500 MG tablet Take 1 tablet (500 mg total) by mouth daily.   venlafaxine XR (EFFEXOR-XR) 150 MG 24 hr capsule TAKE 2 CAPSULES BY MOUTH DAILY.   No facility-administered encounter medications on file as of 11/07/2020.    Star Rating Drugs: No ACE/ARB   Reviewed chart prior to disease state call. Spoke with patient regarding BP  Recent Office Vitals: BP Readings from Last 3 Encounters:  08/11/20 (!) 156/91  06/20/20 (!) 166/86  04/04/20 (!) 148/84   Pulse Readings from Last 3 Encounters:  08/11/20 (!) 103  06/20/20 (!) 104  04/04/20 87    Wt Readings from Last 3 Encounters:  08/11/20 156 lb (70.8 kg)  06/20/20 156 lb (70.8 kg)  04/04/20 152 lb (68.9 kg)     Kidney Function Lab Results  Component Value Date/Time   CREATININE 1.09 06/20/2020 12:15 PM   CREATININE 1.10 01/21/2020 03:02  PM   CREATININE 1.13 (H) 02/15/2015 03:06 PM   CREATININE 1.00 05/06/2013 04:29 PM   GFR 53.09 (L) 06/20/2020 12:15 PM   GFRNONAA 51 (L) 09/07/2019 01:33 PM   GFRNONAA 53 (L) 02/15/2015 03:06 PM   GFRAA 59 (L) 09/07/2019 01:33 PM   GFRAA 61 02/15/2015 03:06 PM    BMP Latest Ref Rng & Units 06/20/2020 01/21/2020 09/07/2019  Glucose 70 - 99 mg/dL 78 97 112(H)  BUN 6 - 23 mg/dL _0 Creatinine 0.40 - 1.20 mg/dL 1.09 1.10 1.13(H)  BUN/Creat Ratio 11 - 26 - - -  Sodium 135 - 145 mEq/L 142 140 142  Potassium 3.5 - 5.1 mEq/L 3.8 4.0 4.2  Chloride 96 - 112  mEq/L 104 104 104  CO2 19 - 32 mEq/L _1 Calcium 8.4 - 10.5 mg/dL 9.1 9.9 9.1     Current antihypertensive regimen: Patient is taking amlodipine-benazepril   How often are you checking your Blood Pressure? The patient states that she get her blood pressure checked when she goes to the pain clinic and it has been normal   Current home BP readings: Patient has no blood pressure readings   What recent interventions/DTPs have been made by any provider to improve Blood Pressure control since last CPP Visit: Patient is to continue with blood pressure meds   Any recent hospitalizations or ED visits since last visit with CPP? The patient has not been to the hospital or ED visits   What diet changes have been made to improve Blood Pressure Control? The patient states that she tries to watch her salt intake   What exercise is being done to improve your Blood Pressure Control? The patient states that she does not exercise gets to sob   Adherence Review: Is the patient currently on ACE/ARB medication? No Does the patient have >5 day gap between last estimated fill dates? No   Wendy Poet, [facility]  Time spent:30

## 2020-11-22 DIAGNOSIS — F17200 Nicotine dependence, unspecified, uncomplicated: Secondary | ICD-10-CM | POA: Diagnosis not present

## 2020-11-22 DIAGNOSIS — G2581 Restless legs syndrome: Secondary | ICD-10-CM | POA: Diagnosis not present

## 2020-11-22 DIAGNOSIS — M545 Low back pain, unspecified: Secondary | ICD-10-CM | POA: Diagnosis not present

## 2020-11-22 DIAGNOSIS — Z79899 Other long term (current) drug therapy: Secondary | ICD-10-CM | POA: Diagnosis not present

## 2020-11-22 DIAGNOSIS — F17210 Nicotine dependence, cigarettes, uncomplicated: Secondary | ICD-10-CM | POA: Diagnosis not present

## 2020-11-23 ENCOUNTER — Other Ambulatory Visit: Payer: Self-pay | Admitting: Adult Health

## 2020-11-23 ENCOUNTER — Other Ambulatory Visit: Payer: Self-pay | Admitting: Internal Medicine

## 2020-11-28 DIAGNOSIS — R9431 Abnormal electrocardiogram [ECG] [EKG]: Secondary | ICD-10-CM | POA: Diagnosis not present

## 2020-11-28 DIAGNOSIS — I10 Essential (primary) hypertension: Secondary | ICD-10-CM | POA: Diagnosis not present

## 2020-11-30 ENCOUNTER — Telehealth: Payer: Self-pay | Admitting: Pharmacist

## 2020-11-30 NOTE — Progress Notes (Signed)
    Chronic Care Management Pharmacy Assistant   Name: Jill Phillips  MRN: [id number] DOB: Jul 13, 1956   Reason for Encounter: Chart Review     Medications: Outpatient Encounter Medications as of 11/30/2020  Medication Sig  . acetaminophen (TYLENOL) 500 MG tablet Take 1,000 mg by mouth every 6 (six) hours as needed (for headaches.).  [name] albuterol (PROAIR HFA) 108 (90 Base) MCG/ACT inhaler INHALE 2 PUFFS INTO THE LUNGS EVERY 6 HOURS AS NEEDED FOR WHEEZING OR SHORTNESS OF BREATH.  [name] amLODipine-benazepril (LOTREL) 10-40 MG capsule TAKE 1 CAPSULE BY MOUTH DAILY.  [name] atorvastatin (LIPITOR) 20 MG tablet TAKE 1 TABLET BY MOUTH DAILY.  . cimetidine (TAGAMET) 400 MG tablet Take 400 mg by mouth 2 (two) times daily.  . clonazePAM (KLONOPIN) 1 MG tablet TAKE 1 TABLET BY MOUTH 2 TIMES DAILY AS NEEDED FOR ANXIETY  . docusate sodium (COLACE) 100 MG capsule Take 200 mg by mouth at bedtime.  . fentaNYL 37.5 MCG/HR PT72 Apply 1 patch topically every 3 (three) days.  [name] gabapentin (NEURONTIN) 400 MG capsule TAKE 1 CAPSULE BY MOUTH 3 TIMES DAILY.  [name] HYDROcodone-acetaminophen (NORCO) 10-325 MG tablet Take 1 tablet by mouth every 6 (six) hours as needed for severe pain (Chronic back pain).  [name] levothyroxine (SYNTHROID) 88 MCG tablet TAKE 1 TABLET BY MOUTH DAILY.  [name] Methen-Hyosc-Meth Blue-Na Phos (ME/NAPHOS/MB/HYO1) 81.6 MG TABS Take 1 tablet by mouth 3 (three) times daily.  [name] NARCAN 4 MG/0.1ML LIQD nasal spray kit Place 1 spray into the nose as needed (accidental overdose).  . nicotine (NICODERM CQ - DOSED IN MG/24 HOURS) 21 mg/24hr patch Place 1 patch (21 mg total) onto the skin daily.  [name] omeprazole (PRILOSEC) 40 MG capsule TAKE 1 CAPSULE BY MOUTH 2 TIMES DAILY BEFORE A MEAL.  [name] QUEtiapine (SEROQUEL) 100 MG tablet Take 1.5 tablets (150 mg total) by mouth at bedtime.  [name] rOPINIRole (REQUIP) 0.5 MG tablet Take 0.5 mg by mouth 4 (four) times daily as needed.  . Sennosides (SENNA LAX PO) Take 2 tablets by mouth at  bedtime.  [name] SPIRIVA HANDIHALER 18 MCG inhalation capsule PLACE 1 CAPSULE INTO INHALER AND INHALE DAILY.  [name] tiZANidine (ZANAFLEX) 4 MG tablet TAKE 1 TABLET BY MOUTH EVERY 8 HOURS AS NEEDED  . valACYclovir (VALTREX) 500 MG tablet Take 1 tablet (500 mg total) by mouth daily.  [name] venlafaxine XR (EFFEXOR-XR) 150 MG 24 hr capsule TAKE 2 CAPSULES BY MOUTH DAILY.   No facility-administered encounter medications on file as of 11/30/2020.    Pharmacist Review:  Reviewed chart for medication changes and adherence.   No gaps in adherence identified. Patient has follow up scheduled with pharmacy team. No further action required.   [place] Pharmacist Assistant [phone]

## 2020-12-01 ENCOUNTER — Other Ambulatory Visit: Payer: Self-pay | Admitting: Internal Medicine

## 2020-12-07 ENCOUNTER — Ambulatory Visit: Payer: Medicare Other | Admitting: Specialist

## 2020-12-13 DIAGNOSIS — F17210 Nicotine dependence, cigarettes, uncomplicated: Secondary | ICD-10-CM | POA: Diagnosis not present

## 2020-12-13 DIAGNOSIS — M545 Low back pain, unspecified: Secondary | ICD-10-CM | POA: Diagnosis not present

## 2020-12-13 DIAGNOSIS — Z79899 Other long term (current) drug therapy: Secondary | ICD-10-CM | POA: Diagnosis not present

## 2020-12-13 DIAGNOSIS — F17200 Nicotine dependence, unspecified, uncomplicated: Secondary | ICD-10-CM | POA: Diagnosis not present

## 2020-12-14 ENCOUNTER — Telehealth: Payer: Medicare Other

## 2020-12-14 NOTE — Progress Notes (Deleted)
Chronic Care Management Pharmacy Note  12/14/2020 Name:  Jill Phillips MRN:  [id number] DOB:  03/22/1956  Subjective: Jill Phillips is an 65 y.o. year old female who is a primary patient of Burns, Claudina Lick, MD.  The CCM team was consulted for assistance with disease management and care coordination needs.    Engaged with patient by telephone for follow up visit in response to provider referral for pharmacy case management and/or care coordination services.   Consent to Services:  The patient was given information about Chronic Care Management services, agreed to services, and gave verbal consent prior to initiation of services.  Please see initial visit note for detailed documentation.   Patient Care Team: [name], MD as PCP - General (Internal Medicine) Charlton Haws, [facility] as Pharmacist (Pharmacist)  Patient's daughter lives with her and "is not doing what she's supposed to" and this causes her a lot of stress. Patient saw pain management doctor yesterday, they did a chest Xray and found an infection, gave her antibiotic and steroid injections but did not prescribe oral medications. 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 Pt reports she is very stressed about all of this as well.  Recent office visits: 06/28/20 pt call - switched zolpidem back to seroquel at pt's request.  06/20/20 Dr Quay Burow OV: chronic f/u - c/o urinary frequency, LE swelling, toe pain, depression, BP elevated d/t stress, insomnia. Acute gout - rx'd prednisone taper. Insomnia - dc'd seroquel, started Zolpidem 5 mg. Added hydralazine 25 mg TID for BP.  11/16/19 Dr Quay Burow OV: recurrent UTI, rx Augmentin and fluconazole. Urine cx came back contaminated, not likely true UTI. Referred to urology for continued bladder pain.  Recent consult visits: 08/24/20 NP Justin Mend ([facility]): referred to GI for epigastric pain,  emesis.  08/11/20 Dr [name] (orthopedic surg): f/u DDD. No med changes.  03/17/20 Dr Primus Bravo (pain mgmt): f/u chronic pain.  11/15/19 Dr Marlou Sa (orthopedic surg): bilateral thumb pain d/t arthritis. Normal X-ray of shoulder. Plan for thumb injections at f/u.  10/13/19 Dr Marlou Sa (orthopedic surg): Shoulder surgery 09/09/19.  No issues, RTC 4 wks.  Objective:  Lab Results  Component Value Date   CREATININE 1.09 06/20/2020   BUN 13 06/20/2020   GFR 53.09 (L) 06/20/2020   GFRNONAA 51 (L) 09/07/2019   GFRAA 59 (L) 09/07/2019   NA 142 06/20/2020   K 3.8 06/20/2020   CALCIUM 9.1 06/20/2020   CO2 30 06/20/2020    Lab Results  Component Value Date/Time   HGBA1C 6.6 (H) 06/20/2020 12:15 PM   HGBA1C 6.2 01/21/2020 03:02 PM   GFR 53.09 (L) 06/20/2020 12:15 PM   GFR 49.86 (L) 01/21/2020 03:02 PM    Last diabetic Eye exam:  Lab Results  Component Value Date/Time   HMDIABEYEEXA No Retinopathy 12/09/2017 12:31 PM    Last diabetic Foot exam: No results found for: HMDIABFOOTEX   Lab Results  Component Value Date   CHOL 209 (H) 01/21/2020   HDL 44.00 01/21/2020   LDLCALC 86 11/03/2018   LDLDIRECT 124.0 01/21/2020   TRIG 219.0 (H) 01/21/2020   CHOLHDL 5 01/21/2020    Hepatic Function Latest Ref Rng & Units 06/20/2020 01/21/2020 12/03/2018  Total Protein 6.0 - 8.3 g/dL 7.6 7.9 8.0  Albumin 3.5 - 5.2 g/dL 4.5 4.6 4.5  AST 0 - 37 U/L 15 17 33  ALT 0 - 35 U/L 8 10  40  Alk Phosphatase 39 - 117 U/L 145(H) 162(H) 126  Total Bilirubin 0.2 - 1.2 mg/dL 0.3 0.4 0.5    Lab Results  Component Value Date/Time   TSH 3.65 06/20/2020 12:15 PM   TSH 1.04 01/21/2020 03:02 PM   FREET4 0.91 05/22/2015 03:52 PM   FREET4 0.88 01/18/2015 12:36 PM    CBC Latest Ref Rng & Units 06/20/2020 01/21/2020 09/07/2019  WBC 4.0 - 10.5 K/uL 8.3 7.7 7.8  Hemoglobin 12.0 - 15.0 g/dL 13.1 13.8 13.3  Hematocrit 36.0 - 46.0 % 39.5 42.1 41.3  Platelets 150.0 - 400.0 K/uL 235.0 291.0 264   No results found for:  VD25OH  Clinical ASCVD: No  The 10-year ASCVD risk score Mikey Bussing DC Jr., et al., 2013) is: 37.5%   Values used to calculate the score:     Age: 47 years     Sex: Female     Is Non-Hispanic African American: No     Diabetic: Yes     Tobacco smoker: Yes     Systolic Blood Pressure: 650 mmHg     Is BP treated: Yes     HDL Cholesterol: 44 mg/dL     Total Cholesterol: 209 mg/dL    Depression screen [facility] 2/9 08/24/2020 04/04/2020 12/09/2018  Decreased Interest 0 0 0  Down, Depressed, Hopeless 0 0 -  PHQ - 2 Score 0 0 0  Altered sleeping - 2 -  Tired, decreased energy - 0 -  Change in appetite - 0 -  Feeling bad or failure about yourself  - 0 -  Trouble concentrating - 0 -  Moving slowly or fidgety/restless - 0 -  Suicidal thoughts - 0 -  PHQ-9 Score - 2 -  Difficult doing work/chores - Not difficult at all -  Some recent data might be hidden   No flowsheet data found.  Social History   Tobacco Use  Smoking Status Current Every Day Smoker  . Packs/day: 1.25  . Years: 45.00  . Pack years: 56.25  . Types: Cigarettes  Smokeless Tobacco Never Used  Tobacco Comment   form given 12/25/16    BP Readings from Last 3 Encounters:  08/11/20 (!) 156/91  06/20/20 (!) 166/86  04/04/20 (!) 148/84   Pulse Readings from Last 3 Encounters:  08/11/20 (!) 103  06/20/20 (!) 104  04/04/20 87   Wt Readings from Last 3 Encounters:  08/11/20 156 lb (70.8 kg)  06/20/20 156 lb (70.8 kg)  04/04/20 152 lb (68.9 kg)    Assessment/Interventions: Review of patient past medical history, allergies, medications, health status, including review of consultants reports, laboratory and other test data, was performed as part of comprehensive evaluation and provision of chronic care management services.   SDOH:  (Social Determinants of Health) assessments and interventions performed:    CCM Care Plan  Allergies  Allergen Reactions  . Flagyl [Metronidazole] Diarrhea  . Limonene Itching    Yeast  infection in mouth  . Sulfa Antibiotics Other (See Comments)    Yeast infection in mouth  . Doxycycline     Mouth soreness - reaction vs thrush?  [name] Amoxicillin Other (See Comments)    REACTION: Oral yeast infection Has patient had a PCN reaction causing immediate rash, facial/tongue/throat swelling, SOB or lightheadedness with hypotension: No Has patient had a PCN reaction causing severe rash involving mucus membranes or skin necrosis: No Has patient had a PCN reaction that required hospitalization No Has patient had a PCN reaction occurring within the last  10 years: No If all of the above answers are "NO", then may proceed with Cephalosporin use.   . Chlorzoxazone Other (See Comments)    headache  . Codeine Other (See Comments)    headache  . Darvocet [Propoxyphene N-Acetaminophen] Itching  . Dilaudid [Hydromorphone Hcl] Itching  . Keflex [Cephalexin] Other (See Comments)    Pt does not recall reaction (maybe yeast infection)  . Morphine And Related Itching  . Nitrofurantoin Monohyd Macro Hives    Reaction to [employer]  . Percocet [Oxycodone-Acetaminophen] Itching    Patient can tolerate Acetaminophen solely  . Trazodone And Nefazodone Palpitations    Medications Reviewed Today    Reviewed by Charlton Haws, [facility] (Pharmacist) on 09/29/20 at [place] List Status: <None>  Medication Order Taking? Sig Documenting Provider Last Dose Status Informant  acetaminophen (TYLENOL) 500 MG tablet [id number] Yes Take 1,000 mg by mouth every 6 (six) hours as needed (for headaches.). [provider] Taking Active Self  albuterol (PROAIR HFA) 108 (90 Base) MCG/ACT inhaler [id number] Yes INHALE 1 PUFF INTO THE LUNGS EVERY 6 HOURS AS NEEDED FOR WHEEZING OR SHORTNESS OF BREATH.NEED OFFICE VISIT FOR FURTHER REFILLS  Patient taking differently: Inhale 1 puff into the lungs every 6 (six) hours as needed for wheezing or shortness of breath.   Flossie Buffy, NP Taking Active    amLODipine-benazepril (LOTREL) 10-40 MG capsule [id number] Yes TAKE 1 CAPSULE BY MOUTH DAILY. [name], MD Taking Active   atorvastatin (LIPITOR) 20 MG tablet [id number] Yes TAKE 1 TABLET BY MOUTH DAILY. [name], MD Taking Active   cimetidine (TAGAMET) 400 MG tablet [id number] Yes Take 400 mg by mouth 2 (two) times daily. [provider] Taking Active Self  clonazePAM (KLONOPIN) 1 MG tablet [id number] Yes TAKE 1 TABLET BY MOUTH 2 TIMES DAILY AS NEEDED FOR ANXIETY Burns, Claudina Lick, MD Taking Active   docusate sodium (COLACE) 100 MG capsule [id number] Yes Take 200 mg by mouth at bedtime. [provider] Taking Active Self  fentaNYL 37.5 MCG/HR PT72 [id number] Yes Apply 1 patch topically every 3 (three) days. [provider] Taking Active   gabapentin (NEURONTIN) 400 MG capsule [id number] Yes TAKE 1 CAPSULE BY MOUTH 3 TIMES DAILY. [name], MD Taking Active   HYDROcodone-acetaminophen [facility]) 10-325 MG tablet [id number] Yes Take 1 tablet by mouth every 6 (six) hours as needed for severe pain (Chronic back pain). [name], MD Taking Active   levothyroxine (SYNTHROID) 88 MCG tablet [id number] Yes TAKE 1 TABLET BY MOUTH DAILY. [name], MD Taking Active   Methen-Hyosc-Meth Blue-Na Phos (ME/NAPHOS/MB/HYO1) 81.6 MG TABS [id number] Yes Take 1 tablet by mouth 3 (three) times daily. [provider] Taking Active   NARCAN 4 MG/0.1ML LIQD nasal spray kit [id number] Yes Place 1 spray into the nose as needed (accidental overdose). [name], MD Taking Active   nicotine (NICODERM CQ - DOSED IN MG/24 HOURS) 21 mg/24hr patch [id number] Yes Place 1 patch (21 mg total) onto the skin daily. [name], MD Taking Active   omeprazole (PRILOSEC) 40 MG capsule [id number] Yes TAKE 1 CAPSULE BY MOUTH 2 TIMES DAILY BEFORE A MEAL. [name], MD Taking Active   QUEtiapine (SEROQUEL) 100 MG tablet [id number] Yes Take 1.5 tablets (150 mg total) by mouth at bedtime.  [name], MD Taking Active   rOPINIRole (REQUIP) 0.5 MG tablet [id number] Yes Take 0.5 mg by mouth 4 (four) times daily as needed. [provider] Taking Active   Sennosides [facility] LAX PO) [id number] Yes Take 2 tablets by mouth at bedtime. [provider] Taking Active   SPIRIVA HANDIHALER 18 MCG inhalation capsule [id number] Yes PLACE 1 CAPSULE INTO INHALER AND INHALE DAILY. [name], MD Taking Active   tiZANidine (ZANAFLEX) 4 MG tablet [id number] Yes TAKE 1 TABLET BY MOUTH EVERY 8 HOURS AS NEEDED [name], MD Taking Active   valACYclovir (VALTREX) 500 MG tablet [id number] Yes Take 1 tablet (500 mg total) by mouth daily. [name], MD Taking Active   venlafaxine XR (EFFEXOR-XR) 150 MG 24 hr capsule [id number] Yes TAKE 2 CAPSULES BY MOUTH DAILY. [name], MD Taking Active   Med List Note Payton Doughty, CPhT 07/26/15 1253): Pain management with Dr. Alger Simons          Patient Active Problem List   Diagnosis Date Noted  . Acute gout 06/20/2020  . Interstitial cystitis 01/21/2020  . Status post shoulder surgery 09/09/2019  . Nausea 08/19/2019  . Anxiety 05/04/2019  . Insomnia 05/04/2019  . Syncope 12/08/2018  . Disorder of rotator cuff syndrome of left shoulder and allied disorder 06/22/2018  . Bilateral leg edema 06/04/2018  . Type 2 diabetes mellitus without complication, without long-term current use of insulin ([place]) 12/02/2017  . Sacral pain 09/17/2017  . COPD (chronic obstructive pulmonary disease) ([place]) 04/21/2017  . Lower back pain 03/03/2017  . CKD (chronic kidney disease) stage 3, GFR 30-59 ml/min (HCC) 08/07/2016  . GERD (gastroesophageal reflux disease) 08/07/2016  . Chronic respiratory failure ([place]) 07/29/2016  . Arthritis of carpometacarpal [facility]) joint of left thumb 07/09/2016  . Pneumothorax on left 01/31/2016  . Chronic, continuous use of opioids 09/06/2015  . Degenerative disc disease, lumbar 08/01/2015    Class:  Chronic  . Spondylolisthesis of lumbar region 08/01/2015    Class: Chronic  . Urinary, incontinence, stress female 05/06/2014  . Constipation 05/05/2014  . 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 HTN (hypertension) 05/19/2013  . HLD (hyperlipidemia) 05/19/2013  . Depression 05/19/2013  . Hypothyroidism 05/19/2013  . Tobacco abuse   . Osteoarthritis of both knees 11/18/2011  . RLS (restless legs syndrome) 11/18/2011    Immunization History  Administered Date(s) Administered  . Influenza, Seasonal, Injecte, Preservative Fre 08/05/2012  . Influenza,inj,Quad PF,6+ Mos 05/05/2014, 05/22/2015, 07/29/2016, 06/04/2018  . Influenza-Unspecified 07/16/2017, 07/30/2020  . Moderna Sars-Covid-2 Vaccination 01/13/2020, 07/30/2020  . Pneumococcal Conjugate-13 12/02/2017  . Tdap 07/14/2013    Conditions to be addressed/monitored:  HTN, HLD and COPD  Patient Care Plan: CCM Pharmacy Care Plan    Problem Identified: HTN, HLD, Depression/Anxiety, COPD, Tobacco Use, Chronic Pain   Priority: High    Goal: Patient-Specific Goal   Start Date: 09/29/2020  Expected End Date: 03/29/2021  This Visit's Progress: On track  Priority: High  Note:   Current Barriers:  . Unable to independently monitor therapeutic efficacy . Unable to achieve control of BP   Pharmacist Clinical Goal(s):  [name] Over the next 60 days, patient will achieve adherence to monitoring guidelines and medication adherence to achieve therapeutic efficacy . achieve control of BP as evidenced by home BP readings through collaboration with PharmD and provider.   Interventions: . 1:1 collaboration with [name], MD regarding development and update of comprehensive plan of care as evidenced by provider attestation and co-signature . Inter-disciplinary care team collaboration (see longitudinal plan of care) . Comprehensive medication review performed; medication list updated in electronic medical record  Hypertension (BP goal  <  130/80) -uncontrolled - pt never started hydralazine (rx'd in Oct 2021) -Current treatment: . Amlodipine-benazepril 10-40 mg daily -Medications previously tried: clonidine, HCTZ, propranolol -Current home BP readings: 140/88 -Denies hypotensive/hypertensive symptoms -Educated on BP goals and benefits of medications for prevention of heart attack, stroke and kidney damage; Importance of home blood pressure monitoring; -Counseled to monitor BP at home 2-3 times weekly, document, and provide log at future appointments -Counseled on diet and exercise extensively Recommended to continue current medication  -Patient has cardiology appt next week, will defer med changes  Hyperlipidemia: (LDL goal < 100) -uncontrolled -Current treatment: . Atorvastatin 20 mg daily -Medications previously tried: pravastatin, gemfibrozil -Educated on Cholesterol goals;  Benefits of statin for ASCVD risk reduction; Importance of limiting foods high in cholesterol; -Counseled on diet and exercise extensively Recommended to continue current medication  COPD (Goal: control symptoms and prevent exacerbations) -controlled -Current treatment  . Spiriva Handihaler 57mg 1 puff daily . Albuterol HFA prn -Gold Grade: Gold 1 (FEV1>80%) -Current COPD Classification:  A (low sx, <2 exacerbations/yr) -Pulmonary function testing: 2015 - FEV1 88% predicted, FEV/FVC 0.86 -Exacerbations requiring treatment in last 6 months: 1 -Patient reports consistent use of maintenance inhaler -Frequency of rescue inhaler use: rare -Counseled on Proper inhaler technique; Benefits of consistent maintenance inhaler use When to use rescue inhaler -Recommended to continue current medication  Depression/Anxiety/Insomnia (Goal: manage symptoms) -uncontrolled - family stress and health concerns are causing her issues currently -Current treatment: . Venlafaxine ER 150 mg - 2 cap daily . Quetiapine 100 mg - 1.5 tab daily HS . Clonazepam  1 mg BID prn -Medications previously tried/failed: zolpidem -PHQ9: 2 (04/2020) - sleeping -GAD: not on file -Connected with PCP for mental health support -Educated on Benefits of medication for symptom control  -Counseled on differences between sertraline and venlafaxine; pain mgmt doctor mentioned sertraline may be better for her, however pt is not willing to taper off of venlafaxine -Recommended to continue current medication  Chronic Pain (Goal: manage symptoms) -osteoarthritis (knees), lumbar DDD, rotator cuff disorder -pain management: Dr FRoyce Macadamia-controlled -Current treatment  . Fentanyl 37.5 mcg/72 hr patch . Hydrocodone-APAP 10-325 mg q6h PRN . Tylenol 500 mg PRN . Gabapentin 400 mg TID . Tizanidine 4 mg q8h PRN . Ropinirole 0.5 mg QID prn -Patient is satisfied with current regimen and denies issues -Recommended to continue current medication  Tobacco use (Goal quit smoking) -uncontrolled - pt not willing to quit right now due to life stressors -Current treatment  . Nicotine patch 21 mg/25hr -Patient smokes 1.25 ppd x 40 years -Patient triggers include: stress - reports MOTIVATION to quit is moderate -reports CONFIDENCE in quitting is low  -Previous quit attempts: Chantix -Counseled on benefits of using nicotine patch + gum in combination to fight cravings  Patient Goals/Self-Care Activities . Over the next 60 days, patient will:  - take medications as prescribed Keep upcoming appts with Cardiology and Pulmonology  Follow Up Plan: Telephone follow up appointment with care management team member scheduled for: 2 months     Medication Assistance: None required.  Patient affirms current coverage meets needs.  [place] [facility] [place]  [place]Phone: [phone]: [phone] Uses pill box? Yes Pt endorses 100% compliance  We discussed: Current  pharmacy is preferred with insurance plan and patient is satisfied with pharmacy services - pharmacy delivery  Plan: Continue current medication management strategy  Follow Up:  Patient agrees to Care Plan and Follow-up.  Plan: Telephone follow up appointment with care management team member scheduled for:  2 months  Charlene Brooke, PharmD, [facility] Clinical Pharmacist [facility] [phone]

## 2020-12-15 ENCOUNTER — Telehealth: Payer: Self-pay | Admitting: Pharmacist

## 2020-12-20 ENCOUNTER — Other Ambulatory Visit: Payer: Self-pay | Admitting: Internal Medicine

## 2020-12-25 NOTE — Progress Notes (Addendum)
  Chronic Care Management Pharmacy Assistant   Name: Jill Phillips  MRN: [id number] DOB: 05/09/1956   Reason for Encounter: Hypertension Disease State Call   Conditions to be addressed/monitored: HTN   Recent office visits:  None ID  Recent consult visits:  None ID  Hospital visits:  None in previous 6 months  Medications: Outpatient Encounter Medications as of 12/15/2020  Medication Sig   acetaminophen (TYLENOL) 500 MG tablet Take 1,000 mg by mouth every 6 (six) hours as needed (for headaches.).   albuterol (PROAIR HFA) 108 (90 Base) MCG/ACT inhaler INHALE 2 PUFFS INTO THE LUNGS EVERY 6 HOURS AS NEEDED FOR WHEEZING OR SHORTNESS OF BREATH.   amLODipine-benazepril (LOTREL) 10-40 MG capsule TAKE 1 CAPSULE BY MOUTH DAILY.   atorvastatin (LIPITOR) 20 MG tablet TAKE 1 TABLET BY MOUTH DAILY.   cimetidine (TAGAMET) 400 MG tablet Take 400 mg by mouth 2 (two) times daily.   clonazePAM (KLONOPIN) 1 MG tablet TAKE 1 TABLET BY MOUTH 2 TIMES DAILY AS NEEDED FOR ANXIETY   docusate sodium (COLACE) 100 MG capsule Take 200 mg by mouth at bedtime.   fentaNYL 37.5 MCG/HR PT72 Apply 1 patch topically every 3 (three) days.   gabapentin (NEURONTIN) 400 MG capsule TAKE 1 CAPSULE BY MOUTH 3 TIMES DAILY.   HYDROcodone-acetaminophen (NORCO) 10-325 MG tablet Take 1 tablet by mouth every 6 (six) hours as needed for severe pain (Chronic back pain).   levothyroxine (SYNTHROID) 88 MCG tablet TAKE 1 TABLET BY MOUTH DAILY.   Methen-Hyosc-Meth Blue-Na Phos (ME/NAPHOS/MB/HYO1) 81.6 MG TABS Take 1 tablet by mouth 3 (three) times daily.   NARCAN 4 MG/0.1ML LIQD nasal spray kit Place 1 spray into the nose as needed (accidental overdose).   nicotine (NICODERM CQ - DOSED IN MG/24 HOURS) 21 mg/24hr patch Place 1 patch (21 mg total) onto the skin daily.   omeprazole (PRILOSEC) 40 MG capsule TAKE 1 CAPSULE BY MOUTH 2 TIMES DAILY BEFORE A MEAL.   QUEtiapine (SEROQUEL) 100 MG tablet Take 1.5 tablets (150 mg total) by  mouth at bedtime.   rOPINIRole (REQUIP) 0.5 MG tablet Take 0.5 mg by mouth 4 (four) times daily as needed.   Sennosides (SENNA LAX PO) Take 2 tablets by mouth at bedtime.   SPIRIVA HANDIHALER 18 MCG inhalation capsule PLACE 1 CAPSULE INTO INHALER AND INHALE DAILY.   tiZANidine (ZANAFLEX) 4 MG tablet TAKE 1 TABLET BY MOUTH EVERY 8 HOURS AS NEEDED   [DISCONTINUED] valACYclovir (VALTREX) 500 MG tablet Take 1 tablet (500 mg total) by mouth daily.   [DISCONTINUED] venlafaxine XR (EFFEXOR-XR) 150 MG 24 hr capsule TAKE 2 CAPSULES BY MOUTH DAILY.   No facility-administered encounter medications on file as of 12/15/2020.   Reviewed chart prior to disease state call. Spoke with patient regarding BP  Recent Office Vitals: BP Readings from Last 3 Encounters:  08/11/20 (!) 156/91  06/20/20 (!) 166/86  04/04/20 (!) 148/84   Pulse Readings from Last 3 Encounters:  08/11/20 (!) 103  06/20/20 (!) 104  04/04/20 87    Wt Readings from Last 3 Encounters:  08/11/20 156 lb (70.8 kg)  06/20/20 156 lb (70.8 kg)  04/04/20 152 lb (68.9 kg)     Kidney Function Lab Results  Component Value Date/Time   CREATININE 1.09 06/20/2020 12:15 PM   CREATININE 1.10 01/21/2020 03:02 PM   CREATININE 1.13 (H) 02/15/2015 03:06 PM   CREATININE 1.00 05/06/2013 04:29 PM   GFR 53.09 (L) 06/20/2020 12:15 PM   GFRNONAA 51 (L) 09/07/2019   01:33 PM   GFRNONAA 53 (L) 02/15/2015 03:06 PM   GFRAA 59 (L) 09/07/2019 01:33 PM   GFRAA 61 02/15/2015 03:06 PM    BMP Latest Ref Rng & Units 06/20/2020 01/21/2020 09/07/2019  Glucose 70 - 99 mg/dL 78 97 112(H)  BUN 6 - 23 mg/dL _0 Creatinine 0.40 - 1.20 mg/dL 1.09 1.10 1.13(H)  BUN/Creat Ratio 11 - 26 - - -  Sodium 135 - 145 mEq/L 142 140 142  Potassium 3.5 - 5.1 mEq/L 3.8 4.0 4.2  Chloride 96 - 112 mEq/L 104 104 104  CO2 19 - 32 mEq/L _1 Calcium 8.4 - 10.5 mg/dL 9.1 9.9 9.1    Current antihypertensive regimen: Amlodipine-benazepril 10-40 mg. Patient also states  that she was prescribed maxzide .8-25 mg  How often are you checking your Blood Pressure? Patient states that she does not take her blood pressure. States that she usually has it done at the pain clinic and it is usually good  Current home BP readings: None noted  What recent interventions/DTPs have been made by any provider to improve Blood Pressure control since last CPP Visit: Recommended to continue current medication  Any recent hospitalizations or ED visits since last visit with CPP? None ID  What diet changes have been made to improve Blood Pressure Control?  Patient states that she has not made any changes to her diet  What exercise is being done to improve your Blood Pressure Control?  Patient states that she only leaves the house when she goes to the pain clinic, states she is not very active  Adherence Review: Is the patient currently on ACE/ARB medication? No Does the patient have >5 day gap between last estimated fill dates? No  Star Rating Drugs: Atorvastatin 11/06/20 90 ds   Ethelene Hal Clinical Pharmacist Assistant [phone]

## 2020-12-28 NOTE — Progress Notes (Signed)
Subjective:    Patient ID: Jill Phillips, female    DOB: Jan 19, 1956, 65 y.o.   MRN: [id number]  HPI The patient is here for follow up of their chronic medical problems, including htn, diabetes, hyperlipidemia, hypothyroidism, gerd, insomnia, depression, anxiety, CKD, chronic pain  She is taking all of her medications as prescribed.     She is eating pretty well.    Having bladder pain - sees urology.   Medications and allergies reviewed with patient and updated if appropriate.  Patient Active Problem List   Diagnosis Date Noted  . Acute gout 06/20/2020  . Interstitial cystitis 01/21/2020  . Status post shoulder surgery 09/09/2019  . Nausea 08/19/2019  . Anxiety 05/04/2019  . Insomnia 05/04/2019  . Syncope 12/08/2018  . Disorder of rotator cuff syndrome of left shoulder and allied disorder 06/22/2018  . Bilateral leg edema 06/04/2018  . Type 2 diabetes mellitus without complication, without long-term current use of insulin ([place]) 12/02/2017  . Sacral pain 09/17/2017  . COPD (chronic obstructive pulmonary disease) ([place]) 04/21/2017  . Lower back pain 03/03/2017  . CKD (chronic kidney disease) stage 3, GFR 30-59 ml/min (H[facility]) 08/07/2016  . GERD (gastroesophageal reflux disease) 08/07/2016  . Chronic respiratory failure ([place]) 07/29/2016  . Arthritis of carpometacarpal [facility]) joint of left thumb 07/09/2016  . Pneumothorax on left 01/31/2016  . Chronic, continuous use of opioids 09/06/2015  . Degenerative disc disease, lumbar 08/01/2015  . Spondylolisthesis of lumbar region 08/01/2015  . Urinary, incontinence, stress female 05/06/2014  . Constipation 05/05/2014  . 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 HTN (hypertension) 05/19/2013  . HLD (hyperlipidemia) 05/19/2013  . Depression 05/19/2013  . Hypothyroidism 05/19/2013  . Tobacco abuse   . Osteoarthritis of both knees 11/18/2011  . RLS (restless legs syndrome) 11/18/2011    Current Outpatient Medications on File Prior to Visit   Medication Sig Dispense Refill  . acetaminophen (TYLENOL) 500 MG tablet Take 1,000 mg by mouth every 6 (six) hours as needed (for headaches.).    [name] albuterol (PROAIR HFA) 108 (90 Base) MCG/ACT inhaler INHALE 2 PUFFS INTO THE LUNGS EVERY 6 HOURS AS NEEDED FOR WHEEZING OR SHORTNESS OF BREATH. 8.5 g 0  . amLODipine-benazepril (LOTREL) 10-40 MG capsule TAKE 1 CAPSULE BY MOUTH DAILY. 90 capsule 1  . atorvastatin (LIPITOR) 20 MG tablet TAKE 1 TABLET BY MOUTH DAILY. 90 tablet 2  . cimetidine (TAGAMET) 400 MG tablet Take 400 mg by mouth 2 (two) times daily.    . clonazePAM (KLONOPIN) 1 MG tablet TAKE 1 TABLET BY MOUTH 2 TIMES DAILY AS NEEDED FOR ANXIETY 60 tablet 0  . docusate sodium (COLACE) 100 MG capsule Take 200 mg by mouth at bedtime.    . fentaNYL 37.5 MCG/HR PT72 Apply 1 patch topically every 3 (three) days.    [name] gabapentin (NEURONTIN) 400 MG capsule TAKE 1 CAPSULE BY MOUTH 3 TIMES DAILY. 270 capsule 1  . HYDROcodone-acetaminophen (NORCO) 10-325 MG tablet Take 1 tablet by mouth every 6 (six) hours as needed for severe pain (Chronic back pain). 84 tablet 0  . levothyroxine (SYNTHROID) 88 MCG tablet TAKE 1 TABLET BY MOUTH DAILY. 90 tablet 1  . Methen-Hyosc-Meth Blue-Na Phos (ME/NAPHOS/MB/HYO1) 81.6 MG TABS Take 1 tablet by mouth 3 (three) times daily.    [name] NARCAN 4 MG/0.1ML LIQD nasal spray kit Place 1 spray into the nose as needed (accidental overdose). 1 each 0  . nicotine (NICODERM CQ - DOSED IN MG/24 HOURS) 21 mg/24hr patch Place 1 patch (  21 mg total) onto the skin daily. 28 patch 1  . omeprazole (PRILOSEC) 40 MG capsule TAKE 1 CAPSULE BY MOUTH 2 TIMES DAILY BEFORE A MEAL. 180 capsule 3  . QUEtiapine (SEROQUEL) 100 MG tablet Take 1.5 tablets (150 mg total) by mouth at bedtime. 135 tablet 1  . rOPINIRole (REQUIP) 0.5 MG tablet Take 0.5 mg by mouth 4 (four) times daily as needed.    . Sennosides (SENNA LAX PO) Take 2 tablets by mouth at bedtime.    [name] SPIRIVA HANDIHALER 18 MCG inhalation capsule  PLACE 1 CAPSULE INTO INHALER AND INHALE DAILY. 30 capsule 2  . tiZANidine (ZANAFLEX) 4 MG capsule Take 4 mg by mouth 3 (three) times daily as needed.    [name] tiZANidine (ZANAFLEX) 4 MG tablet TAKE 1 TABLET BY MOUTH EVERY 8 HOURS AS NEEDED 90 tablet 2  . triamterene-hydrochlorothiazide (MAXZIDE-25) 37.5-25 MG tablet Take 1 tablet by mouth daily.    . valACYclovir (VALTREX) 500 MG tablet TAKE 1 TABLET (500 MG TOTAL) BY MOUTH DAILY. 90 tablet 1  . venlafaxine XR (EFFEXOR-XR) 150 MG 24 hr capsule TAKE 2 CAPSULES BY MOUTH DAILY. 90 capsule 0   No current facility-administered medications on file prior to visit.    Past Medical History:  Diagnosis Date  . Active smoker   . Anxiety   . Calcifying tendinitis of shoulder   . Chronic back pain   . Chronic pain syndrome   . COPD (chronic obstructive pulmonary disease) ([place])   . Dysthymic disorder   . Emphysema lung ([place])   . GERD (gastroesophageal reflux disease)   . Headache    "weekly maybe" (01/31/2016)  . Heart murmur    for years, nothing to be concerned about  . Herpes genitalia   . History of blood transfusion 1980   related to "back surgery"  . Hyperlipidemia   . Hypertension   . Hypothyroidism   . Lumbago   . Osteoarthrosis, unspecified whether generalized or localized, lower leg   . Pain in joint, upper arm   . Pneumothorax, left 01/31/2016   S/P Left posterior subcostal pain injection on 01/30/2016  . PONV (postoperative nausea and vomiting)    gets nauseous  with longer surgery. Difficuty voiding after surgery  . Postlaminectomy syndrome, thoracic region   . Primary localized osteoarthrosis, lower leg   . Restless leg syndrome   . Sleep apnea    s/p surgery- last sleep study 2011- doesnt use oxygen or machine at night as instructed,.   12/2014- Dr Halford Chessman  reports it is negative.  . Thyroid disease     Past Surgical History:  Procedure Laterality Date  . APPENDECTOMY    . BACK SURGERY     18 back surgeries (2 thoracic & 16  lumbar) (01/31/2016)  . DILATION AND CURETTAGE OF UTERUS    . HAMMER TOE SURGERY    . IR RADIOLOGIST EVAL & MGMT  07/21/2017  . JOINT REPLACEMENT    . KNEE ARTHROSCOPY Right   . LAPAROSCOPIC CHOLECYSTECTOMY    . LUMBAR FUSION N/A 08/01/2015   Procedure: Right sided L1-2 and L2-3 transforaminal lumbar interbody fusion with cages, Extension of posterior fusion T12 to L3, Replaced pedicle screws bilaterally L1-L2 , Replaced left sided pedicle screws L-3. Instrumentation T12 to L3 using local bone graft, Vivigen allograft and cancellous chips;  Surgeon: [name], MD;  Location: [place];  Service: Orthopedics;  Laterality: N/A;  . LUMBAR LAMINECTOMY/DECOMPRESSION MICRODISCECTOMY N/A 01/28/2014   Procedure: Minimally Invasive Right  L1-2 Microdiscectomy;  Surgeon: [name], MD;  Location: [place];  Service: Orthopedics;  Laterality: N/A;  . REVERSE SHOULDER ARTHROPLASTY Left 09/09/2019   Procedure: LEFT REVERSE SHOULDER REPLACEMENT;  Surgeon: Meredith Pel, MD;  Location: [facility];  Service: Orthopedics;  Laterality: Left;  . TOTAL HIP ARTHROPLASTY Right   . TOTAL KNEE ARTHROPLASTY  05/29/2012   Procedure: TOTAL KNEE ARTHROPLASTY;  Surgeon: Mcarthur Rossetti, MD;  Location: [facility];  Service: Orthopedics;  Laterality: Right;  Right Total Knee Arthroplasty  . TUBAL LIGATION    . UVULOPALATOPHARYNGOPLASTY    . VAGINAL HYSTERECTOMY      Social History   Socioeconomic History  . Marital status: Divorced    Spouse name: n/a  . Number of children: 2  . Years of education: 12+  . Highest education level: Not on file  Occupational History  . Occupation: disability    Comment: back surgeries  Tobacco Use  . Smoking status: Current Every Day Smoker    Packs/day: 1.25    Years: 45.00    Pack years: 56.25    Types: Cigarettes  . Smokeless tobacco: Never Used  . Tobacco comment: form given 12/25/16  Vaping Use  . Vaping Use: Never used  Substance and Sexual Activity  . Alcohol use: No     Alcohol/week: 0.0 standard drinks  . Drug use: No    Types: Marijuana    Comment: 01/31/2016 "none since ~ 1980"  . Sexual activity: Never    Partners: Male  Other Topics Concern  . Not on file  Social History Narrative   Lives alone.  One daughter is local, but is getting ready to move to [place], where her children live with their father.  The other daughter lives near [place], [place].   Social Determinants of Health   Financial Resource Strain: Low Risk   . Difficulty of Paying Living Expenses: Not very hard  Food Insecurity: Not on file  Transportation Needs: Not on file  Physical Activity: Not on file  Stress: Not on file  Social Connections: Not on file    Family History  Problem Relation Age of Onset  . Kidney disease Mother   . Heart disease Father   . Anuerysm Brother 29       brain  . Heart disease Brother   . Heart disease Sister 79       s/p CABG  . Hypertension Sister   . Colon cancer Neg Hx     Review of Systems  Constitutional: Negative for chills and fever.  Respiratory: Positive for cough. Negative for shortness of breath and wheezing.   Cardiovascular: Negative for chest pain, palpitations and leg swelling.  Neurological: Positive for headaches (every morning). Negative for light-headedness.       Objective:   Vitals:   12/29/20 1257  BP: (!) 150/80  Pulse: 97  Temp: 98.3 F (36.8 C)  SpO2: 95%   BP Readings from Last 3 Encounters:  12/29/20 (!) 150/80  08/11/20 (!) 156/91  06/20/20 (!) 166/86   Wt Readings from Last 3 Encounters:  12/29/20 151 lb 6.4 oz (68.7 kg)  08/11/20 156 lb (70.8 kg)  06/20/20 156 lb (70.8 kg)   Body mass index is 25.99 kg/m.   Physical Exam    Constitutional: Appears well-developed and well-nourished. No distress.  HENT:  Head: Normocephalic and atraumatic.  Neck: Neck supple. No tracheal deviation present. No thyromegaly present.  No cervical lymphadenopathy Cardiovascular: Normal rate, regular  rhythm and  normal heart sounds.   No murmur heard. No carotid bruit .  No edema Pulmonary/Chest: Effort normal and breath sounds normal. No respiratory distress. No has no wheezes. No rales.  Skin: Skin is warm and dry. Not diaphoretic.  Psychiatric: Normal mood and affect. Behavior is normal.      Assessment & Plan:    See Problem List for Assessment and Plan of chronic medical problems.    This visit occurred during the SARS-CoV-2 public health emergency.  Safety protocols were in place, including screening questions prior to the visit, additional usage of staff PPE, and extensive cleaning of exam room while observing appropriate contact time as indicated for disinfecting solutions.

## 2020-12-28 NOTE — Patient Instructions (Addendum)
bp needs to be less than 135/90 consistently. Start monitoring your BP at home   Think about getting your mammogram and colonoscopy.     Pneumovax immunization administered today.    Blood work was ordered.     Medications changes include :   none   Your prescription(s) have been submitted to your pharmacy. Please take as directed and contact our office if you believe you are having problem(s) with the medication(s).    Please followup in 6 months

## 2020-12-29 ENCOUNTER — Other Ambulatory Visit: Payer: Self-pay

## 2020-12-29 ENCOUNTER — Ambulatory Visit (INDEPENDENT_AMBULATORY_CARE_PROVIDER_SITE_OTHER): Payer: Medicare Other | Admitting: Internal Medicine

## 2020-12-29 ENCOUNTER — Encounter: Payer: Self-pay | Admitting: Internal Medicine

## 2020-12-29 VITALS — BP 150/80 | HR 97 | Temp 98.3°F | Ht 64.0 in | Wt 151.4 lb

## 2020-12-29 DIAGNOSIS — N1831 Chronic kidney disease, stage 3a: Secondary | ICD-10-CM | POA: Diagnosis not present

## 2020-12-29 DIAGNOSIS — I10 Essential (primary) hypertension: Secondary | ICD-10-CM

## 2020-12-29 DIAGNOSIS — E038 Other specified hypothyroidism: Secondary | ICD-10-CM

## 2020-12-29 DIAGNOSIS — G4709 Other insomnia: Secondary | ICD-10-CM | POA: Diagnosis not present

## 2020-12-29 DIAGNOSIS — F3289 Other specified depressive episodes: Secondary | ICD-10-CM

## 2020-12-29 DIAGNOSIS — F419 Anxiety disorder, unspecified: Secondary | ICD-10-CM

## 2020-12-29 DIAGNOSIS — K219 Gastro-esophageal reflux disease without esophagitis: Secondary | ICD-10-CM

## 2020-12-29 DIAGNOSIS — E7849 Other hyperlipidemia: Secondary | ICD-10-CM | POA: Diagnosis not present

## 2020-12-29 DIAGNOSIS — E119 Type 2 diabetes mellitus without complications: Secondary | ICD-10-CM | POA: Diagnosis not present

## 2020-12-29 LAB — CBC WITH DIFFERENTIAL/PLATELET
Basophils Absolute: 0.1 10*3/uL (ref 0.0–0.1)
Basophils Relative: 1 % (ref 0.0–3.0)
Eosinophils Absolute: 0.2 10*3/uL (ref 0.0–0.7)
Eosinophils Relative: 1.9 % (ref 0.0–5.0)
HCT: 43.1 % (ref 36.0–46.0)
Hemoglobin: 14.3 g/dL (ref 12.0–15.0)
Lymphocytes Relative: 28.5 % (ref 12.0–46.0)
Lymphs Abs: 2.4 10*3/uL (ref 0.7–4.0)
MCHC: 33.2 g/dL (ref 30.0–36.0)
MCV: 90.8 fl (ref 78.0–100.0)
Monocytes Absolute: 0.4 10*3/uL (ref 0.1–1.0)
Monocytes Relative: 4.9 % (ref 3.0–12.0)
Neutro Abs: 5.3 10*3/uL (ref 1.4–7.7)
Neutrophils Relative %: 63.7 % (ref 43.0–77.0)
Platelets: 270 10*3/uL (ref 150.0–400.0)
RBC: 4.75 Mil/uL (ref 3.87–5.11)
RDW: 15.3 % (ref 11.5–15.5)
WBC: 8.4 10*3/uL (ref 4.0–10.5)

## 2020-12-29 LAB — LIPID PANEL
Cholesterol: 206 mg/dL — ABNORMAL HIGH (ref 0–200)
HDL: 42.5 mg/dL (ref >39.00)
NonHDL: 163.49
Total CHOL/HDL Ratio: 5
Triglycerides: 257 mg/dL — ABNORMAL HIGH (ref 0.0–149.0)
VLDL: 51.4 mg/dL — ABNORMAL HIGH (ref 0.0–40.0)

## 2020-12-29 LAB — COMPREHENSIVE METABOLIC PANEL
ALT: 11 U/L (ref 0–35)
AST: 14 U/L (ref 0–37)
Albumin: 4.5 g/dL (ref 3.5–5.2)
Alkaline Phosphatase: 168 U/L — ABNORMAL HIGH (ref 39–117)
BUN: 17 mg/dL (ref 6–23)
CO2: 30 mEq/L (ref 19–32)
Calcium: 9.6 mg/dL (ref 8.4–10.5)
Chloride: 100 mEq/L (ref 96–112)
Creatinine, Ser: 1.31 mg/dL — ABNORMAL HIGH (ref 0.40–1.20)
GFR: 42.64 mL/min — ABNORMAL LOW (ref >60.00)
Glucose, Bld: 92 mg/dL (ref 70–99)
Potassium: 4.2 mEq/L (ref 3.5–5.1)
Sodium: 141 mEq/L (ref 135–145)
Total Bilirubin: 0.4 mg/dL (ref 0.2–1.2)
Total Protein: 8 g/dL (ref 6.0–8.3)

## 2020-12-29 LAB — HEMOGLOBIN A1C: Hgb A1c MFr Bld: 6.7 % — ABNORMAL HIGH (ref 4.6–6.5)

## 2020-12-29 LAB — LDL CHOLESTEROL, DIRECT: Direct LDL: 129 mg/dL

## 2020-12-29 LAB — TSH: TSH: 1.47 u[IU]/mL (ref 0.35–4.50)

## 2020-12-29 MED ORDER — CLONAZEPAM 1 MG PO TABS: 1.0000 mg | ORAL_TABLET | Freq: Two times a day (BID) | ORAL | 0 refills | Status: DC | PRN

## 2020-12-29 NOTE — Assessment & Plan Note (Signed)
Chronic Controlled, stable Continue effexor 300 mg daily

## 2020-12-29 NOTE — Assessment & Plan Note (Signed)
Chronic  Clinically euthyroid Currently taking levothyroxine 88 mcg daily Check tsh  Titrate med dose if needed  

## 2020-12-29 NOTE — Assessment & Plan Note (Addendum)
Chronic Controlled, stable Continue clonazepam 1 mg bid and effexor 300 mg daily

## 2020-12-29 NOTE — Assessment & Plan Note (Signed)
Chronic GERD not controlled Continue omeprazole but increase to bid temporarily and then dec back to once daily

## 2020-12-29 NOTE — Addendum Note (Signed)
Addended by: Boris Lown B on: 12/29/2020 01:29 PM   Modules accepted: Orders

## 2020-12-29 NOTE — Assessment & Plan Note (Addendum)
Chronic BP not controlled, but she feels it is typically lower Continue amlodipine-benazepril 10-40 mg daily and maxzide 37.5-25 mg daily ( she is only taking it three times a week) -- start taking it daily cmp

## 2020-12-29 NOTE — Assessment & Plan Note (Addendum)
Chronic Mild - intermittent She was placed on maxzide - currently taking TIW - BP not controlled - will take daily May dec GFR further - cmp today Stressed BP and sugar control Stressed smoking cessation Does not take any nsaids

## 2020-12-29 NOTE — Assessment & Plan Note (Addendum)
Chronic Controlled, stable Continue seroquel 150 mg nightly

## 2020-12-29 NOTE — Assessment & Plan Note (Signed)
Chronic Check lipid panel  Continue lipitor 20 mg qd Regular exercise and healthy diet encouraged

## 2021-01-03 NOTE — Telephone Encounter (Signed)
   Follow up message   Patient has additional questions about lab results Please call

## 2021-01-03 NOTE — Telephone Encounter (Signed)
Patient calling again, would like someone to call her

## 2021-01-04 DIAGNOSIS — R300 Dysuria: Secondary | ICD-10-CM | POA: Diagnosis not present

## 2021-01-04 DIAGNOSIS — R350 Frequency of micturition: Secondary | ICD-10-CM | POA: Diagnosis not present

## 2021-01-04 DIAGNOSIS — N3010 Interstitial cystitis (chronic) without hematuria: Secondary | ICD-10-CM | POA: Diagnosis not present

## 2021-01-05 ENCOUNTER — Other Ambulatory Visit: Payer: Self-pay | Admitting: Internal Medicine

## 2021-01-09 DIAGNOSIS — M545 Low back pain, unspecified: Secondary | ICD-10-CM | POA: Diagnosis not present

## 2021-01-09 DIAGNOSIS — F17210 Nicotine dependence, cigarettes, uncomplicated: Secondary | ICD-10-CM | POA: Diagnosis not present

## 2021-01-09 DIAGNOSIS — Z79899 Other long term (current) drug therapy: Secondary | ICD-10-CM | POA: Diagnosis not present

## 2021-01-09 DIAGNOSIS — F17200 Nicotine dependence, unspecified, uncomplicated: Secondary | ICD-10-CM | POA: Diagnosis not present

## 2021-01-10 ENCOUNTER — Telehealth: Payer: Self-pay | Admitting: Internal Medicine

## 2021-01-10 MED ORDER — PANTOPRAZOLE SODIUM 40 MG PO TBEC: 40.0000 mg | DELAYED_RELEASE_TABLET | Freq: Two times a day (BID) | ORAL | 1 refills | Status: DC

## 2021-01-10 NOTE — Telephone Encounter (Signed)
I believe she is currently taking omeprazole 40 mg twice daily.  Please confirm with her if she is taking the cimetidine or Tagamet and as well?  Lets try changing the omeprazole to a different medication to see if that helps.  If it does not she will need to see GI for further evaluation.  New prescription sent to [facility] drug

## 2021-01-10 NOTE — Telephone Encounter (Signed)
Patient states that every time she eats or takes her pills it feels like it just sits there. Patient thinks she has acid reflux because when she takes alka seltzer it helps. Patient denies appointment because she was seen recently

## 2021-01-11 ENCOUNTER — Encounter: Payer: Self-pay | Admitting: Specialist

## 2021-01-11 ENCOUNTER — Other Ambulatory Visit: Payer: Self-pay

## 2021-01-11 ENCOUNTER — Ambulatory Visit (INDEPENDENT_AMBULATORY_CARE_PROVIDER_SITE_OTHER): Payer: Medicare Other

## 2021-01-11 ENCOUNTER — Ambulatory Visit (INDEPENDENT_AMBULATORY_CARE_PROVIDER_SITE_OTHER): Payer: Medicare Other | Admitting: Specialist

## 2021-01-11 VITALS — BP 122/74 | HR 95 | Ht 64.0 in | Wt 151.4 lb

## 2021-01-11 DIAGNOSIS — M5134 Other intervertebral disc degeneration, thoracic region: Secondary | ICD-10-CM

## 2021-01-11 DIAGNOSIS — M4325 Fusion of spine, thoracolumbar region: Secondary | ICD-10-CM

## 2021-01-11 DIAGNOSIS — M19012 Primary osteoarthritis, left shoulder: Secondary | ICD-10-CM

## 2021-01-11 DIAGNOSIS — M1712 Unilateral primary osteoarthritis, left knee: Secondary | ICD-10-CM

## 2021-01-11 DIAGNOSIS — R102 Pelvic and perineal pain: Secondary | ICD-10-CM

## 2021-01-11 DIAGNOSIS — M25551 Pain in right hip: Secondary | ICD-10-CM

## 2021-01-11 DIAGNOSIS — M960 Pseudarthrosis after fusion or arthrodesis: Secondary | ICD-10-CM

## 2021-01-11 DIAGNOSIS — M5414 Radiculopathy, thoracic region: Secondary | ICD-10-CM

## 2021-01-11 DIAGNOSIS — M25559 Pain in unspecified hip: Secondary | ICD-10-CM

## 2021-01-11 NOTE — Progress Notes (Signed)
Office Visit Note   Patient: Jill Phillips           Date of Birth: 1956/02/02           MRN: [id number] Visit Date: 01/11/2021              Requested by: [name], MD [place],  [place] [id number] PCP: [name], MD   Assessment & Plan: Visit Diagnoses:  1. Hip pain     Plan: May ambulate with a cane in the right hand for the left hip. Or use a walker. MRI of the left hip to assess for AVN or bursitis. Ice or hot shower 2-3 times per day.   Follow-Up Instructions: Return in about 3 weeks (around 02/01/2021).   Orders:  Orders Placed This Encounter  Procedures  . XR HIPS BILAT W OR W/O PELVIS 3-4 VIEWS   No orders of the defined types were placed in this encounter.     Procedures: No procedures performed   Clinical Data: No additional findings.   Subjective: Chief Complaint  Patient presents with  . Right Hip - Pain  . Left Hip - Pain    65 year old female with history of right hip hemiarthroplasty for hip fracture in 2010 and she is experiencing left hip pain now and it is making her uncomfortable and giving her difficulty walking. She is having difficulty getting off the floor. Daughter moved her mattress to the floor to decrease the size of the frame and gave her more room. Feet on the floor and slipped off the bed. Right leg and right foot with numbness and paresthesias and it is real numb. The right leg is not weak and she does not walk a lot. Walking makes the left hip painful. She is  Painful with sleeping on the left hip, she does not lie on the left side. Not since her thoracic surgery.    Review of Systems  Constitutional: Negative.   HENT: Negative.   Eyes: Negative.   Respiratory: Negative.   Cardiovascular: Negative.   Gastrointestinal: Negative.   Endocrine: Negative.   Genitourinary: Negative.   Musculoskeletal: Negative.   Skin: Negative.   Allergic/Immunologic: Negative.   Neurological: Negative.   Hematological:  Negative.   Psychiatric/Behavioral: Negative.      Objective: Vital Signs: BP 122/74 (BP Location: Left Arm, Patient Position: Sitting)   Pulse 95   Ht 5\' 4"  (1.626 m)   Wt 151 lb 6.4 oz (68.7 kg)   BMI 25.99 kg/m   Physical Exam Constitutional:      Appearance: She is well-developed.  HENT:     Head: Normocephalic and atraumatic.  Eyes:     Pupils: Pupils are equal, round, and reactive to light.  Pulmonary:     Effort: Pulmonary effort is normal.     Breath sounds: Normal breath sounds.  Abdominal:     General: Bowel sounds are normal.     Palpations: Abdomen is soft.  Musculoskeletal:        General: Normal range of motion.     Cervical back: Normal range of motion and neck supple.     Lumbar back: Negative right straight leg raise test and negative left straight leg raise test.  Skin:    General: Skin is warm and dry.  Neurological:     Mental Status: She is alert and oriented to person, place, and time.  Psychiatric:        Behavior:  Behavior normal.        Thought Content: Thought content normal.        Judgment: Judgment normal.     Back Exam   Tenderness  The patient is experiencing tenderness in the lumbar.  Muscle Strength  Right Quadriceps:  5/5  Left Quadriceps:  5/5  Right Hamstrings:  5/5  Left Hamstrings:  5/5   Tests  Straight leg raise right: negative Straight leg raise left: negative  Other  Toe walk: normal Heel walk: normal Sensation: decreased Erythema: no back redness Scars: present      Specialty Comments:  No specialty comments available.  Imaging: XR HIPS BILAT W OR W/O PELVIS 3-4 VIEWS  Result Date: 01/11/2021 AP and lateral both hips demonstrate right bipolar hemiarthroplasty, the femoral stem is well seated without lucency or loosening. The hip joint with mild narrowing of the joint line but no significant sclerosis. The left hip is showing minimal medial osteophyte joint line narrowing. No acute changes.     PMFS  History: Patient Active Problem List   Diagnosis Date Noted  . Degenerative disc disease, lumbar 08/01/2015    Priority: High    Class: Chronic  . Spondylolisthesis of lumbar region 08/01/2015    Priority: High    Class: Chronic  . Acute gout 06/20/2020  . Interstitial cystitis 01/21/2020  . Status post shoulder surgery 09/09/2019  . Nausea 08/19/2019  . Anxiety 05/04/2019  . Insomnia 05/04/2019  . Syncope 12/08/2018  . Disorder of rotator cuff syndrome of left shoulder and allied disorder 06/22/2018  . Bilateral leg edema 06/04/2018  . Type 2 diabetes mellitus without complication, without long-term current use of insulin ([place]) 12/02/2017  . Sacral pain 09/17/2017  . COPD (chronic obstructive pulmonary disease) ([place]) 04/21/2017  . Lower back pain 03/03/2017  . CKD (chronic kidney disease) stage 3, GFR 30-59 ml/min (HCC) 08/07/2016  . GERD (gastroesophageal reflux disease) 08/07/2016  . Chronic respiratory failure ([place]) 07/29/2016  . Arthritis of carpometacarpal [facility]) joint of left thumb 07/09/2016  . Pneumothorax on left 01/31/2016  . Chronic, continuous use of opioids 09/06/2015  . Urinary, incontinence, stress female 05/06/2014  . Constipation 05/05/2014  . 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 HTN (hypertension) 05/19/2013  . HLD (hyperlipidemia) 05/19/2013  . Depression 05/19/2013  . Hypothyroidism 05/19/2013  . Tobacco abuse   . Osteoarthritis of both knees 11/18/2011  . RLS (restless legs syndrome) 11/18/2011   Past Medical History:  Diagnosis Date  . Active smoker   . Anxiety   . Calcifying tendinitis of shoulder   . Chronic back pain   . Chronic pain syndrome   . COPD (chronic obstructive pulmonary disease) ([place])   . Dysthymic disorder   . Emphysema lung ([place])   . GERD (gastroesophageal reflux disease)   . Headache    "weekly maybe" (01/31/2016)  . Heart murmur    for years, nothing to be concerned about  . Herpes genitalia   . History of blood transfusion 1980    related to "back surgery"  . Hyperlipidemia   . Hypertension   . Hypothyroidism   . Lumbago   . Osteoarthrosis, unspecified whether generalized or localized, lower leg   . Pain in joint, upper arm   . Pneumothorax, left 01/31/2016   S/P Left posterior subcostal pain injection on 01/30/2016  . PONV (postoperative nausea and vomiting)    gets nauseous  with longer surgery. Difficuty voiding after surgery  . Postlaminectomy syndrome, thoracic region   . Primary localized  osteoarthrosis, lower leg   . Restless leg syndrome   . Sleep apnea    s/p surgery- last sleep study 2011- doesnt use oxygen or machine at night as instructed,.   12/2014- Dr Halford Chessman  reports it is negative.  . Thyroid disease     Family History  Problem Relation Age of Onset  . Kidney disease Mother   . Heart disease Father   . Anuerysm Brother 29       brain  . Heart disease Brother   . Heart disease Sister 30       s/p CABG  . Hypertension Sister   . Colon cancer Neg Hx     Past Surgical History:  Procedure Laterality Date  . APPENDECTOMY    . BACK SURGERY     18 back surgeries (2 thoracic & 16 lumbar) (01/31/2016)  . DILATION AND CURETTAGE OF UTERUS    . HAMMER TOE SURGERY    . IR RADIOLOGIST EVAL & MGMT  07/21/2017  . JOINT REPLACEMENT    . KNEE ARTHROSCOPY Right   . LAPAROSCOPIC CHOLECYSTECTOMY    . LUMBAR FUSION N/A 08/01/2015   Procedure: Right sided L1-2 and L2-3 transforaminal lumbar interbody fusion with cages, Extension of posterior fusion T12 to L3, Replaced pedicle screws bilaterally L1-L2 , Replaced left sided pedicle screws L-3. Instrumentation T12 to L3 using local bone graft, Vivigen allograft and cancellous chips;  Surgeon: [name], MD;  Location: [facility];  Service: Orthopedics;  Laterality: N/A;  . LUMBAR LAMINECTOMY/DECOMPRESSION MICRODISCECTOMY N/A 01/28/2014   Procedure: Minimally Invasive Right  L1-2 Microdiscectomy;  Surgeon: [name], MD;  Location: [place];  Service: Orthopedics;   Laterality: N/A;  . REVERSE SHOULDER ARTHROPLASTY Left 09/09/2019   Procedure: LEFT REVERSE SHOULDER REPLACEMENT;  Surgeon: Meredith Pel, MD;  Location: [place];  Service: Orthopedics;  Laterality: Left;  . TOTAL HIP ARTHROPLASTY Right   . TOTAL KNEE ARTHROPLASTY  05/29/2012   Procedure: TOTAL KNEE ARTHROPLASTY;  Surgeon: Mcarthur Rossetti, MD;  Location: [facility];  Service: Orthopedics;  Laterality: Right;  Right Total Knee Arthroplasty  . TUBAL LIGATION    . UVULOPALATOPHARYNGOPLASTY    . VAGINAL HYSTERECTOMY     Social History   Occupational History  . Occupation: disability    Comment: back surgeries  Tobacco Use  . Smoking status: Current Every Day Smoker    Packs/day: 1.25    Years: 45.00    Pack years: 56.25    Types: Cigarettes  . Smokeless tobacco: Never Used  . Tobacco comment: form given 12/25/16  Vaping Use  . Vaping Use: Never used  Substance and Sexual Activity  . Alcohol use: No    Alcohol/week: 0.0 standard drinks  . Drug use: No    Types: Marijuana    Comment: 01/31/2016 "none since ~ 1980"  . Sexual activity: Never    Partners: Male

## 2021-01-11 NOTE — Patient Instructions (Addendum)
  Plan: May ambulate with a cane in the right hand for the left hip. Or use a walker. MRI of the left hip to assess for AVN or bursitis. Ice or hot shower 2-3 times per day.

## 2021-01-11 NOTE — Telephone Encounter (Signed)
Called and left message for patient to return call to clinic.   If she calls back please see if she is taking the omdprazole 40 mg BID and if she is taking Cimetidine or Tagamet.  Dr. Quay Burow sent in a new script for her. If that doesn't help she will need to see GI.

## 2021-01-24 ENCOUNTER — Other Ambulatory Visit: Payer: Medicare Other

## 2021-01-30 ENCOUNTER — Other Ambulatory Visit: Payer: Self-pay | Admitting: Internal Medicine

## 2021-02-02 ENCOUNTER — Other Ambulatory Visit: Payer: Self-pay | Admitting: Internal Medicine

## 2021-02-02 ENCOUNTER — Ambulatory Visit: Payer: Medicare Other | Admitting: Nurse Practitioner

## 2021-02-03 ENCOUNTER — Ambulatory Visit
Admission: RE | Admit: 2021-02-03 | Discharge: 2021-02-03 | Disposition: A | Discharge status: Home / Self Care | Payer: Medicare Other | Source: Ambulatory Visit | Attending: Specialist | Admitting: Specialist

## 2021-02-03 DIAGNOSIS — M25559 Pain in unspecified hip: Secondary | ICD-10-CM

## 2021-02-03 DIAGNOSIS — M25452 Effusion, left hip: Secondary | ICD-10-CM | POA: Diagnosis not present

## 2021-02-03 DIAGNOSIS — M533 Sacrococcygeal disorders, not elsewhere classified: Secondary | ICD-10-CM | POA: Diagnosis not present

## 2021-02-03 DIAGNOSIS — M76892 Other specified enthesopathies of left lower limb, excluding foot: Secondary | ICD-10-CM | POA: Diagnosis not present

## 2021-02-03 DIAGNOSIS — M25552 Pain in left hip: Secondary | ICD-10-CM | POA: Diagnosis not present

## 2021-02-04 ENCOUNTER — Emergency Department (HOSPITAL_COMMUNITY): Payer: Medicare Other

## 2021-02-04 ENCOUNTER — Inpatient Hospital Stay (HOSPITAL_COMMUNITY)
Admission: EM | Admit: 2021-02-04 | Discharge: 2021-02-06 | DRG: 917 | Disposition: A | Discharge status: Home / Self Care | Payer: Medicare Other | Attending: Internal Medicine | Admitting: Internal Medicine

## 2021-02-04 ENCOUNTER — Inpatient Hospital Stay (HOSPITAL_COMMUNITY): Payer: Medicare Other

## 2021-02-04 ENCOUNTER — Other Ambulatory Visit: Payer: Self-pay

## 2021-02-04 DIAGNOSIS — T50904A Poisoning by unspecified drugs, medicaments and biological substances, undetermined, initial encounter: Secondary | ICD-10-CM | POA: Diagnosis not present

## 2021-02-04 DIAGNOSIS — G2581 Restless legs syndrome: Secondary | ICD-10-CM | POA: Diagnosis not present

## 2021-02-04 DIAGNOSIS — Z885 Allergy status to narcotic agent status: Secondary | ICD-10-CM

## 2021-02-04 DIAGNOSIS — Z96641 Presence of right artificial hip joint: Secondary | ICD-10-CM | POA: Diagnosis not present

## 2021-02-04 DIAGNOSIS — F419 Anxiety disorder, unspecified: Secondary | ICD-10-CM | POA: Diagnosis present

## 2021-02-04 DIAGNOSIS — G4730 Sleep apnea, unspecified: Secondary | ICD-10-CM | POA: Diagnosis present

## 2021-02-04 DIAGNOSIS — Z96651 Presence of right artificial knee joint: Secondary | ICD-10-CM | POA: Diagnosis present

## 2021-02-04 DIAGNOSIS — E039 Hypothyroidism, unspecified: Secondary | ICD-10-CM | POA: Diagnosis present

## 2021-02-04 DIAGNOSIS — Y92009 Unspecified place in unspecified non-institutional (private) residence as the place of occurrence of the external cause: Secondary | ICD-10-CM | POA: Diagnosis not present

## 2021-02-04 DIAGNOSIS — F17210 Nicotine dependence, cigarettes, uncomplicated: Secondary | ICD-10-CM | POA: Diagnosis present

## 2021-02-04 DIAGNOSIS — T50901A Poisoning by unspecified drugs, medicaments and biological substances, accidental (unintentional), initial encounter: Secondary | ICD-10-CM | POA: Diagnosis not present

## 2021-02-04 DIAGNOSIS — K219 Gastro-esophageal reflux disease without esophagitis: Secondary | ICD-10-CM | POA: Diagnosis not present

## 2021-02-04 DIAGNOSIS — Z8249 Family history of ischemic heart disease and other diseases of the circulatory system: Secondary | ICD-10-CM

## 2021-02-04 DIAGNOSIS — F1190 Opioid use, unspecified, uncomplicated: Secondary | ICD-10-CM | POA: Diagnosis present

## 2021-02-04 DIAGNOSIS — Z720 Tobacco use: Secondary | ICD-10-CM | POA: Diagnosis present

## 2021-02-04 DIAGNOSIS — Z20822 Contact with and (suspected) exposure to covid-19: Secondary | ICD-10-CM | POA: Diagnosis not present

## 2021-02-04 DIAGNOSIS — E1169 Type 2 diabetes mellitus with other specified complication: Secondary | ICD-10-CM | POA: Diagnosis present

## 2021-02-04 DIAGNOSIS — I959 Hypotension, unspecified: Secondary | ICD-10-CM

## 2021-02-04 DIAGNOSIS — N179 Acute kidney failure, unspecified: Secondary | ICD-10-CM | POA: Diagnosis not present

## 2021-02-04 DIAGNOSIS — Z743 Need for continuous supervision: Secondary | ICD-10-CM | POA: Diagnosis not present

## 2021-02-04 DIAGNOSIS — G9340 Encephalopathy, unspecified: Secondary | ICD-10-CM

## 2021-02-04 DIAGNOSIS — R0902 Hypoxemia: Secondary | ICD-10-CM | POA: Diagnosis not present

## 2021-02-04 DIAGNOSIS — E785 Hyperlipidemia, unspecified: Secondary | ICD-10-CM | POA: Diagnosis present

## 2021-02-04 DIAGNOSIS — R4182 Altered mental status, unspecified: Secondary | ICD-10-CM

## 2021-02-04 DIAGNOSIS — N3010 Interstitial cystitis (chronic) without hematuria: Secondary | ICD-10-CM | POA: Diagnosis present

## 2021-02-04 DIAGNOSIS — I10 Essential (primary) hypertension: Secondary | ICD-10-CM | POA: Diagnosis present

## 2021-02-04 DIAGNOSIS — E876 Hypokalemia: Secondary | ICD-10-CM | POA: Diagnosis not present

## 2021-02-04 DIAGNOSIS — G929 Unspecified toxic encephalopathy: Secondary | ICD-10-CM | POA: Diagnosis present

## 2021-02-04 DIAGNOSIS — Z96612 Presence of left artificial shoulder joint: Secondary | ICD-10-CM | POA: Diagnosis present

## 2021-02-04 DIAGNOSIS — Z881 Allergy status to other antibiotic agents status: Secondary | ICD-10-CM | POA: Diagnosis not present

## 2021-02-04 DIAGNOSIS — G319 Degenerative disease of nervous system, unspecified: Secondary | ICD-10-CM | POA: Diagnosis not present

## 2021-02-04 DIAGNOSIS — Z79890 Hormone replacement therapy (postmenopausal): Secondary | ICD-10-CM

## 2021-02-04 DIAGNOSIS — Z882 Allergy status to sulfonamides status: Secondary | ICD-10-CM | POA: Diagnosis not present

## 2021-02-04 DIAGNOSIS — R000 Tachycardia, unspecified: Secondary | ICD-10-CM | POA: Diagnosis not present

## 2021-02-04 DIAGNOSIS — E860 Dehydration: Secondary | ICD-10-CM | POA: Diagnosis present

## 2021-02-04 DIAGNOSIS — J439 Emphysema, unspecified: Secondary | ICD-10-CM | POA: Diagnosis present

## 2021-02-04 DIAGNOSIS — T40411A Poisoning by fentanyl or fentanyl analogs, accidental (unintentional), initial encounter: Secondary | ICD-10-CM | POA: Diagnosis not present

## 2021-02-04 DIAGNOSIS — F32A Depression, unspecified: Secondary | ICD-10-CM | POA: Diagnosis not present

## 2021-02-04 DIAGNOSIS — E861 Hypovolemia: Secondary | ICD-10-CM | POA: Diagnosis present

## 2021-02-04 DIAGNOSIS — Z888 Allergy status to other drugs, medicaments and biological substances status: Secondary | ICD-10-CM | POA: Diagnosis not present

## 2021-02-04 DIAGNOSIS — Z841 Family history of disorders of kidney and ureter: Secondary | ICD-10-CM

## 2021-02-04 DIAGNOSIS — Z0389 Encounter for observation for other suspected diseases and conditions ruled out: Secondary | ICD-10-CM | POA: Diagnosis not present

## 2021-02-04 DIAGNOSIS — R531 Weakness: Secondary | ICD-10-CM | POA: Diagnosis not present

## 2021-02-04 DIAGNOSIS — G894 Chronic pain syndrome: Secondary | ICD-10-CM | POA: Diagnosis present

## 2021-02-04 DIAGNOSIS — Z79899 Other long term (current) drug therapy: Secondary | ICD-10-CM

## 2021-02-04 DIAGNOSIS — Z79891 Long term (current) use of opiate analgesic: Secondary | ICD-10-CM

## 2021-02-04 DIAGNOSIS — T887XXA Unspecified adverse effect of drug or medicament, initial encounter: Secondary | ICD-10-CM | POA: Diagnosis not present

## 2021-02-04 LAB — CBC WITH DIFFERENTIAL/PLATELET
Abs Immature Granulocytes: 0.04 10*3/uL (ref 0.00–0.07)
Basophils Absolute: 0 10*3/uL (ref 0.0–0.1)
Basophils Relative: 0 %
Eosinophils Absolute: 0.1 10*3/uL (ref 0.0–0.5)
Eosinophils Relative: 1 %
HCT: 39.3 % (ref 36.0–46.0)
Hemoglobin: 12.5 g/dL (ref 12.0–15.0)
Immature Granulocytes: 0 %
Lymphocytes Relative: 13 %
Lymphs Abs: 1.6 10*3/uL (ref 0.7–4.0)
MCH: 30.9 pg (ref 26.0–34.0)
MCHC: 31.8 g/dL (ref 30.0–36.0)
MCV: 97.3 fL (ref 80.0–100.0)
Monocytes Absolute: 0.7 10*3/uL (ref 0.1–1.0)
Monocytes Relative: 5 %
Neutro Abs: 10.2 10*3/uL — ABNORMAL HIGH (ref 1.7–7.7)
Neutrophils Relative %: 81 %
Platelets: 234 10*3/uL (ref 150–400)
RBC: 4.04 MIL/uL (ref 3.87–5.11)
RDW: 16.2 % — ABNORMAL HIGH (ref 11.5–15.5)
WBC: 12.7 10*3/uL — ABNORMAL HIGH (ref 4.0–10.5)
nRBC: 0 % (ref 0.0–0.2)

## 2021-02-04 LAB — RAPID URINE DRUG SCREEN, HOSP PERFORMED
Amphetamines: NOT DETECTED
Barbiturates: NOT DETECTED
Benzodiazepines: POSITIVE — AB
Cocaine: NOT DETECTED
Opiates: POSITIVE — AB
Tetrahydrocannabinol: NOT DETECTED

## 2021-02-04 LAB — URINALYSIS, ROUTINE W REFLEX MICROSCOPIC
Bilirubin Urine: NEGATIVE
Glucose, UA: NEGATIVE mg/dL
Ketones, ur: NEGATIVE mg/dL
Leukocytes,Ua: NEGATIVE
Nitrite: POSITIVE — AB
Protein, ur: NEGATIVE mg/dL
Specific Gravity, Urine: 1.014 (ref 1.005–1.030)
pH: 5 (ref 5.0–8.0)

## 2021-02-04 LAB — I-STAT ARTERIAL BLOOD GAS, ED
Acid-base deficit: 4 mmol/L — ABNORMAL HIGH (ref 0.0–2.0)
Bicarbonate: 22.5 mmol/L (ref 20.0–28.0)
Calcium, Ion: 1.12 mmol/L — ABNORMAL LOW (ref 1.15–1.40)
HCT: 34 % — ABNORMAL LOW (ref 36.0–46.0)
Hemoglobin: 11.6 g/dL — ABNORMAL LOW (ref 12.0–15.0)
O2 Saturation: 94 %
Patient temperature: 98.1
Potassium: 3.8 mmol/L (ref 3.5–5.1)
Sodium: 142 mmol/L (ref 135–145)
TCO2: 24 mmol/L (ref 22–32)
pCO2 arterial: 43.1 mmHg (ref 32.0–48.0)
pH, Arterial: 7.324 — ABNORMAL LOW (ref 7.350–7.450)
pO2, Arterial: 77 mmHg — ABNORMAL LOW (ref 83.0–108.0)

## 2021-02-04 LAB — COMPREHENSIVE METABOLIC PANEL
ALT: 15 U/L (ref 0–44)
AST: 41 U/L (ref 15–41)
Albumin: 3.8 g/dL (ref 3.5–5.0)
Alkaline Phosphatase: 129 U/L — ABNORMAL HIGH (ref 38–126)
Anion gap: 13 (ref 5–15)
BUN: 48 mg/dL — ABNORMAL HIGH (ref 8–23)
CO2: 22 mmol/L (ref 22–32)
Calcium: 8.5 mg/dL — ABNORMAL LOW (ref 8.9–10.3)
Chloride: 106 mmol/L (ref 98–111)
Creatinine, Ser: 6.37 mg/dL — ABNORMAL HIGH (ref 0.44–1.00)
GFR, Estimated: 7 mL/min — ABNORMAL LOW (ref >60)
Glucose, Bld: 100 mg/dL — ABNORMAL HIGH (ref 70–99)
Potassium: 4.4 mmol/L (ref 3.5–5.1)
Sodium: 141 mmol/L (ref 135–145)
Total Bilirubin: 1 mg/dL (ref 0.3–1.2)
Total Protein: 7 g/dL (ref 6.5–8.1)

## 2021-02-04 LAB — AMMONIA: Ammonia: 33 umol/L (ref 9–35)

## 2021-02-04 LAB — RESP PANEL BY RT-PCR (FLU A&B, COVID) ARPGX2
Influenza A by PCR: NEGATIVE
Influenza B by PCR: NEGATIVE
SARS Coronavirus 2 by RT PCR: NEGATIVE

## 2021-02-04 LAB — ACETAMINOPHEN LEVEL: Acetaminophen (Tylenol), Serum: <10 ug/mL — ABNORMAL LOW (ref 10–30)

## 2021-02-04 LAB — CK: Total CK: 2576 U/L — ABNORMAL HIGH (ref 38–234)

## 2021-02-04 LAB — ETHANOL: Alcohol, Ethyl (B): <10 mg/dL (ref <10)

## 2021-02-04 LAB — SALICYLATE LEVEL: Salicylate Lvl: <7 mg/dL — ABNORMAL LOW (ref 7.0–30.0)

## 2021-02-04 LAB — HIV ANTIBODY (ROUTINE TESTING W REFLEX): HIV Screen 4th Generation wRfx: NONREACTIVE

## 2021-02-04 LAB — CBG MONITORING, ED: Glucose-Capillary: 103 mg/dL — ABNORMAL HIGH (ref 70–99)

## 2021-02-04 LAB — LACTIC ACID, PLASMA: Lactic Acid, Venous: 1.6 mmol/L (ref 0.5–1.9)

## 2021-02-04 MED ORDER — GABAPENTIN 100 MG PO CAPS
100.0000 mg | ORAL_CAPSULE | Freq: Two times a day (BID) | ORAL | Status: DC
  Administered 2021-02-05 – 2021-02-06 (×3): 100 mg via ORAL
  Filled 2021-02-04 (×3): qty 1

## 2021-02-04 MED ORDER — ROPINIROLE HCL 0.25 MG PO TABS
0.2500 mg | ORAL_TABLET | Freq: Four times a day (QID) | ORAL | Status: DC | PRN
  Administered 2021-02-05: 0.25 mg via ORAL
  Filled 2021-02-04 (×2): qty 1

## 2021-02-04 MED ORDER — VENLAFAXINE HCL ER 75 MG PO CP24
150.0000 mg | ORAL_CAPSULE | Freq: Every day | ORAL | Status: DC
  Administered 2021-02-05 – 2021-02-06 (×2): 150 mg via ORAL
  Filled 2021-02-04: qty 1
  Filled 2021-02-04: qty 2

## 2021-02-04 MED ORDER — LEVOTHYROXINE SODIUM 88 MCG PO TABS
88.0000 ug | ORAL_TABLET | Freq: Every day | ORAL | Status: DC
  Administered 2021-02-05 – 2021-02-06 (×2): 88 ug via ORAL
  Filled 2021-02-04 (×2): qty 1

## 2021-02-04 MED ORDER — HYDRALAZINE HCL 20 MG/ML IJ SOLN: 5.0000 mg | Freq: Four times a day (QID) | INTRAMUSCULAR | Status: DC | PRN

## 2021-02-04 MED ORDER — CLONAZEPAM 0.25 MG PO TBDP: 0.2500 mg | ORAL_TABLET | Freq: Two times a day (BID) | ORAL | Status: DC | PRN

## 2021-02-04 MED ORDER — ACETAMINOPHEN 500 MG PO TABS: 1000.0000 mg | ORAL_TABLET | Freq: Three times a day (TID) | ORAL | Status: DC | PRN

## 2021-02-04 MED ORDER — SODIUM CHLORIDE 0.9 % IV SOLN
2.0000 g | Freq: Once | INTRAVENOUS | Status: AC
  Administered 2021-02-04: 2 g via INTRAVENOUS
  Filled 2021-02-04: qty 20

## 2021-02-04 MED ORDER — SODIUM CHLORIDE 0.9 % IV SOLN: INTRAVENOUS | Status: DC

## 2021-02-04 MED ORDER — ALBUTEROL SULFATE (2.5 MG/3ML) 0.083% IN NEBU: 2.5000 mg | INHALATION_SOLUTION | Freq: Four times a day (QID) | RESPIRATORY_TRACT | Status: DC | PRN

## 2021-02-04 MED ORDER — DOCUSATE SODIUM 100 MG PO CAPS
200.0000 mg | ORAL_CAPSULE | Freq: Every day | ORAL | Status: DC
  Administered 2021-02-05: 200 mg via ORAL
  Filled 2021-02-04: qty 2

## 2021-02-04 MED ORDER — QUETIAPINE FUMARATE 50 MG PO TABS
150.0000 mg | ORAL_TABLET | Freq: Every day | ORAL | Status: DC
  Administered 2021-02-05: 150 mg via ORAL
  Filled 2021-02-04: qty 1

## 2021-02-04 MED ORDER — SODIUM CHLORIDE 0.9 % IV SOLN: Freq: Once | INTRAVENOUS | Status: AC

## 2021-02-04 MED ORDER — HEPARIN SODIUM (PORCINE) 5000 UNIT/ML IJ SOLN
5000.0000 [IU] | Freq: Three times a day (TID) | INTRAMUSCULAR | Status: DC
  Administered 2021-02-05 – 2021-02-06 (×4): 5000 [IU] via SUBCUTANEOUS
  Filled 2021-02-04 (×4): qty 1

## 2021-02-04 MED ORDER — SODIUM CHLORIDE 0.9 % IV BOLUS
1000.0000 mL | Freq: Once | INTRAVENOUS | Status: AC
Start: 2021-02-04 — End: 2021-02-04
  Administered 2021-02-04: 1000 mL via INTRAVENOUS

## 2021-02-04 MED ORDER — PANTOPRAZOLE SODIUM 40 MG PO TBEC
40.0000 mg | DELAYED_RELEASE_TABLET | Freq: Two times a day (BID) | ORAL | Status: DC
  Administered 2021-02-05 – 2021-02-06 (×3): 40 mg via ORAL
  Filled 2021-02-04 (×3): qty 1

## 2021-02-04 MED ORDER — SENNA 8.6 MG PO TABS
1.0000 | ORAL_TABLET | Freq: Every day | ORAL | Status: DC
  Administered 2021-02-05: 8.6 mg via ORAL
  Filled 2021-02-04: qty 1

## 2021-02-04 MED ORDER — SODIUM CHLORIDE 0.9 % IV SOLN
1.0000 g | INTRAVENOUS | Status: DC
  Filled 2021-02-04: qty 10

## 2021-02-04 MED ORDER — TIZANIDINE HCL 2 MG PO TABS
2.0000 mg | ORAL_TABLET | Freq: Three times a day (TID) | ORAL | Status: DC | PRN
  Filled 2021-02-04: qty 1

## 2021-02-04 MED ORDER — SODIUM CHLORIDE 0.9 % IV BOLUS
1000.0000 mL | Freq: Once | INTRAVENOUS | Status: AC
  Administered 2021-02-04: 1000 mL via INTRAVENOUS

## 2021-02-04 MED ORDER — NICOTINE 21 MG/24HR TD PT24
21.0000 mg | MEDICATED_PATCH | Freq: Every day | TRANSDERMAL | Status: DC
  Filled 2021-02-04: qty 1

## 2021-02-04 MED ORDER — UMECLIDINIUM BROMIDE 62.5 MCG/INH IN AEPB
1.0000 | INHALATION_SPRAY | Freq: Every day | RESPIRATORY_TRACT | Status: DC
  Administered 2021-02-06: 1 via RESPIRATORY_TRACT
  Filled 2021-02-04: qty 7

## 2021-02-04 NOTE — ED Provider Notes (Signed)
[facility] EMERGENCY DEPARTMENT Provider Note   CSN: [id number] Arrival date & time: 02/04/21  1542     History Chief Complaint  Patient presents with  . Drug Overdose    Jill Phillips is a 65 y.o. female.  65 year old female with prior medical history as detailed below presents for evaluation.  Patient's daughter called EMS.  Patient was difficult to arouse overnight and this morning.  Patient's daughter apparently was concerned about possible OD.  She administered 8 mg of intranasal Narcan to the patient.  This improved the patient's mental status.  Narcan was administered prior to EMS arrival on scene.  EMS reports that the patient has been alert and answering questions during transport.  Patient is without specific complaint at this time.  Additional details about possible overdose are difficult to obtain from the patient.  Fentanyl patches were removed from the patient's skin by EMS prior to arrival to the ED.  Chart review does reveal patient is prescribed fentanyl patches, clonazepam, and Norco.  The history is provided by the patient, medical records and the EMS personnel.  Altered Mental Status Presenting symptoms: confusion, disorientation and lethargy   Severity:  Moderate Most recent episode:  Today Episode history:  Unable to specify Timing:  Unable to specify Chronicity:  New      Past Medical History:  Diagnosis Date  . Active smoker   . Anxiety   . Calcifying tendinitis of shoulder   . Chronic back pain   . Chronic pain syndrome   . COPD (chronic obstructive pulmonary disease) ([place])   . Dysthymic disorder   . Emphysema lung ([place])   . GERD (gastroesophageal reflux disease)   . Headache    "weekly maybe" (01/31/2016)  . Heart murmur    for years, nothing to be concerned about  . Herpes genitalia   . History of blood transfusion 1980   related to "back surgery"  . Hyperlipidemia   . Hypertension   . Hypothyroidism   . Lumbago   .  Osteoarthrosis, unspecified whether generalized or localized, lower leg   . Pain in joint, upper arm   . Pneumothorax, left 01/31/2016   S/P Left posterior subcostal pain injection on 01/30/2016  . PONV (postoperative nausea and vomiting)    gets nauseous  with longer surgery. Difficuty voiding after surgery  . Postlaminectomy syndrome, thoracic region   . Primary localized osteoarthrosis, lower leg   . Restless leg syndrome   . Sleep apnea    s/p surgery- last sleep study 2011- doesnt use oxygen or machine at night as instructed,.   12/2014- Dr Halford Chessman  reports it is negative.  . Thyroid disease     Patient Active Problem List   Diagnosis Date Noted  . Acute gout 06/20/2020  . Interstitial cystitis 01/21/2020  . Status post shoulder surgery 09/09/2019  . Nausea 08/19/2019  . Anxiety 05/04/2019  . Insomnia 05/04/2019  . Syncope 12/08/2018  . Disorder of rotator cuff syndrome of left shoulder and allied disorder 06/22/2018  . Bilateral leg edema 06/04/2018  . Type 2 diabetes mellitus without complication, without long-term current use of insulin ([place]) 12/02/2017  . Sacral pain 09/17/2017  . COPD (chronic obstructive pulmonary disease) ([place]) 04/21/2017  . Lower back pain 03/03/2017  . CKD (chronic kidney disease) stage 3, GFR 30-59 ml/min (HCC) 08/07/2016  . GERD (gastroesophageal reflux disease) 08/07/2016  . Chronic respiratory failure ([place]) 07/29/2016  . Arthritis of carpometacarpal [facility]) joint of left thumb 07/09/2016  .  Pneumothorax on left 01/31/2016  . Chronic, continuous use of opioids 09/06/2015  . Degenerative disc disease, lumbar 08/01/2015    Class: Chronic  . Spondylolisthesis of lumbar region 08/01/2015    Class: Chronic  . Urinary, incontinence, stress female 05/06/2014  . Constipation 05/05/2014  . 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 HTN (hypertension) 05/19/2013  . HLD (hyperlipidemia) 05/19/2013  . Depression 05/19/2013  . Hypothyroidism 05/19/2013  . Tobacco abuse    . Osteoarthritis of both knees 11/18/2011  . RLS (restless legs syndrome) 11/18/2011    Past Surgical History:  Procedure Laterality Date  . APPENDECTOMY    . BACK SURGERY     18 back surgeries (2 thoracic & 16 lumbar) (01/31/2016)  . DILATION AND CURETTAGE OF UTERUS    . HAMMER TOE SURGERY    . IR RADIOLOGIST EVAL & MGMT  07/21/2017  . JOINT REPLACEMENT    . KNEE ARTHROSCOPY Right   . LAPAROSCOPIC CHOLECYSTECTOMY    . LUMBAR FUSION N/A 08/01/2015   Procedure: Right sided L1-2 and L2-3 transforaminal lumbar interbody fusion with cages, Extension of posterior fusion T12 to L3, Replaced pedicle screws bilaterally L1-L2 , Replaced left sided pedicle screws L-3. Instrumentation T12 to L3 using local bone graft, Vivigen allograft and cancellous chips;  Surgeon: [name], MD;  Location: [place];  Service: Orthopedics;  Laterality: N/A;  . LUMBAR LAMINECTOMY/DECOMPRESSION MICRODISCECTOMY N/A 01/28/2014   Procedure: Minimally Invasive Right  L1-2 Microdiscectomy;  Surgeon: [name], MD;  Location: [place];  Service: Orthopedics;  Laterality: N/A;  . REVERSE SHOULDER ARTHROPLASTY Left 09/09/2019   Procedure: LEFT REVERSE SHOULDER REPLACEMENT;  Surgeon: Meredith Pel, MD;  Location: [place];  Service: Orthopedics;  Laterality: Left;  . TOTAL HIP ARTHROPLASTY Right   . TOTAL KNEE ARTHROPLASTY  05/29/2012   Procedure: TOTAL KNEE ARTHROPLASTY;  Surgeon: Mcarthur Rossetti, MD;  [facility];  Service: Orthopedics;  Laterality: Right;  Right Total Knee Arthroplasty  . TUBAL LIGATION    . UVULOPALATOPHARYNGOPLASTY    . VAGINAL HYSTERECTOMY       OB History   No obstetric history on file.     Family History  Problem Relation Age of Onset  . Kidney disease Mother   . Heart disease Father   . Anuerysm Brother 29       brain  . Heart disease Brother   . Heart disease Sister 49       s/p CABG  . Hypertension Sister   . Colon cancer Neg Hx     Social History   Tobacco Use   . Smoking status: Current Every Day Smoker    Packs/day: 1.25    Years: 45.00    Pack years: 56.25    Types: Cigarettes  . Smokeless tobacco: Never Used  . Tobacco comment: form given 12/25/16  Vaping Use  . Vaping Use: Never used  Substance Use Topics  . Alcohol use: No    Alcohol/week: 0.0 standard drinks  . Drug use: No    Types: Marijuana    Comment: 01/31/2016 "none since ~ 1980"    Home Medications   Allergies    Flagyl [metronidazole], Limonene, Sulfa antibiotics, Doxycycline, Amoxicillin, Chlorzoxazone, Codeine, Darvocet [propoxyphene n-acetaminophen], Dilaudid [hydromorphone hcl], Keflex [cephalexin], Morphine and related, Nitrofurantoin monohyd macro, Percocet [oxycodone-acetaminophen], and Trazodone and nefazodone  Review of Systems   Review of Systems  Psychiatric/Behavioral: Positive for confusion.  All other systems reviewed and are negative.   Physical Exam Updated Vital Signs BP  120/68   Pulse 99   Temp 98.2 F (36.8 C) (Oral)   Resp 19   SpO2 95%   Physical Exam Vitals and nursing note reviewed.  Constitutional:      General: She is not in acute distress.    Appearance: She is well-developed.     Comments: Somnolent but awakens with stimulation  HENT:     Head: Normocephalic and atraumatic.  Eyes:     Conjunctiva/sclera: Conjunctivae normal.     Pupils: Pupils are equal, round, and reactive to light.  Cardiovascular:     Rate and Rhythm: Normal rate and regular rhythm.     Heart sounds: Normal heart sounds.  Pulmonary:     Effort: Pulmonary effort is normal. No respiratory distress.     Breath sounds: Normal breath sounds.  Abdominal:     General: There is no distension.     Palpations: Abdomen is soft.     Tenderness: There is no abdominal tenderness.  Musculoskeletal:        General: No deformity. Normal range of motion.     Cervical back: Normal range of motion and neck supple.  Skin:    General: Skin is warm and dry.  Neurological:      General: No focal deficit present.     Mental Status: She is oriented to person, place, and time.     Comments: Somnolent, but awakes with stimulation and answers questions       ED Results / Procedures / Treatments   Labs (all labs ordered are listed, but only abnormal results are displayed) Labs Reviewed  COMPREHENSIVE METABOLIC PANEL - Abnormal; Notable for the following components:      Result Value   Glucose, Bld 100 (*)    BUN 48 (*)    Creatinine, Ser 6.37 (*)    Calcium 8.5 (*)    Alkaline Phosphatase 129 (*)    GFR, Estimated 7 (*)    All other components within normal limits  CBC WITH DIFFERENTIAL/PLATELET - Abnormal; Notable for the following components:   WBC 12.7 (*)    RDW 16.2 (*)    Neutro Abs 10.2 (*)    All other components within normal limits  ACETAMINOPHEN LEVEL - Abnormal; Notable for the following components:   Acetaminophen (Tylenol), Serum <10 (*)    All other components within normal limits  SALICYLATE LEVEL - Abnormal; Notable for the following components:   Salicylate Lvl <3.6 (*)    All other components within normal limits  CK - Abnormal; Notable for the following components:   Total CK 2,576 (*)    All other components within normal limits  CBG MONITORING, ED - Abnormal; Notable for the following components:   Glucose-Capillary 103 (*)    All other components within normal limits  I-STAT ARTERIAL BLOOD GAS, ED - Abnormal; Notable for the following components:   pH, Arterial 7.324 (*)    pO2, Arterial 77 (*)    Acid-base deficit 4.0 (*)    Calcium, Ion 1.12 (*)    HCT 34.0 (*)    Hemoglobin 11.6 (*)    All other components within normal limits  RESP PANEL BY RT-PCR (FLU A&B, COVID) ARPGX2  ETHANOL  LACTIC ACID, PLASMA  AMMONIA  URINALYSIS, ROUTINE W REFLEX MICROSCOPIC  RAPID URINE DRUG SCREEN, HOSP PERFORMED    EKG EKG Interpretation  Date/Time:  Sunday February 04 2021 15:49:05 EDT Ventricular Rate:  101 PR Interval:  177 QRS  Duration: 103 QT Interval:  342 QTC Calculation: 444 R Axis:   57 Text Interpretation: Sinus tachycardia Nonspecific T abnrm, anterolateral leads Confirmed by Dene Gentry [phone]) on 02/04/2021 3:54:01 PM   Radiology MR Hip Left w/o contrast  Result Date: 02/04/2021 CLINICAL DATA:  Left hip pain over the last 3 months EXAM: MR OF THE LEFT HIP WITHOUT CONTRAST TECHNIQUE: Multiplanar, multisequence MR imaging was performed. No intravenous contrast was administered. COMPARISON:  Radiographs 01/11/2021 and MRI from 05/22/2017 FINDINGS: Bones: Postoperative findings in the lumbar spine with multilevel lower lumbar fusion and posterior decompression. Metal artifact from right total hip prosthesis noted. Likely graft harvest site from the right iliac bone. Mild degenerative findings along the right SI joint on image 4 series 3. Mild spurring of the left acetabulum. Articular cartilage and labrum Articular cartilage:  No chondral defect identified. Labrum: Borderline prominence of the anterior superior labral recess on image 9 series 6, but given that this linear signal is only visible on a single image it probably represents recess rather than a tear along the recess. Joint or bursal effusion Joint effusion:  Absent Bursae: No regional bursitis. Muscles and tendons Muscles and tendons: 2.7 by 1.8 by 3.2 cm region of faintly abnormally accentuated T2 signal within the gluteus minimus muscle on image 24 series 5, without associated T1 signal abnormality. This region of apparent edema is somewhat focal and nonspecific but could reflect a muscle strain. Other findings Miscellaneous:   No supplemental non-categorized findings. IMPRESSION: 1. Localized edema in a portion of the gluteus minimus muscle, muscle strain not excluded. 2. Multilevel lower lumbar fusion with posterior decompression. 3. Borderline prominence of the recess of the left anterior superior labrum but I do not see a definite labral tear. 4. Right  total hip prosthesis, with likely graft harvest site from the right iliac bone. 5. Mild degenerative findings along the inferior aspect of the right SI joint. Electronically Signed   By: Van Clines M.D.   On: 02/04/2021 09:20    Procedures Procedures  CRITICAL CARE Performed by: Valarie Merino   Total critical care time: 45 minutes  Critical care time was exclusive of separately billable procedures and treating other patients.  Critical care was necessary to treat or prevent imminent or life-threatening deterioration.  Critical care was time spent personally by me on the following activities: development of treatment plan with patient and/or surrogate as well as nursing, discussions with consultants, evaluation of patient's response to treatment, examination of patient, obtaining history from patient or surrogate, ordering and performing treatments and interventions, ordering and review of laboratory studies, ordering and review of radiographic studies, pulse oximetry and re-evaluation of patient's condition.   Medications Ordered in ED Medications - No data to display  ED Course  I have reviewed the triage vital signs and the nursing notes.  Pertinent labs & imaging results that were available during my care of the patient were reviewed by me and considered in my medical decision making (see chart for details).    MDM Rules/Calculators/A&P                          MDM  MSE complete  DAMA HEDGEPETH was evaluated in Emergency Department on 02/04/2021 for the symptoms described in the history of present illness. She was evaluated in the context of the global COVID-19 pandemic, which necessitated consideration that the patient might be at risk for infection with the SARS-CoV-2 virus that causes COVID-19. Institutional protocols and algorithms  that pertain to the evaluation of patients at risk for COVID-19 are in a state of rapid change based on information released by regulatory  bodies including the CDC and federal and state organizations. These policies and algorithms were followed during the patient's care in the ED.  Patient presenting from home by EMS for reported AMS.  Patient apparently received Narcan just prior to EMS arrival after being found very lethargic and difficult to arouse.  Per EMS, Narcan improved the patient's mental status.  Additional history suggest that patient takes pain medication but has inadequate p.o. intake.  Patient with noted elevation in creatinine today.  Case discussed briefly with Dr. Jonnie Finner covering nephrology.  He agrees with plan for IV fluids.  He requests that hospitalist service make official consult if needed tomorrow.  Patient would benefit from further work-up and treatment.  Hospitalist service (Dr. Roosevelt Locks) is aware of case and will evaluate for admission.     Final Clinical Impression(s) / ED Diagnoses Final diagnoses:  Altered mental status, unspecified altered mental status type  AKI (acute kidney injury) [facility])    Rx / DC Orders ED Discharge Orders    None       Valarie Merino, MD 02/05/21 2333

## 2021-02-04 NOTE — H&P (Signed)
History and Physical    Jill Phillips YSA:[id number] DOB: 1955/11/04 DOA: 02/04/2021  PCP: [name], MD (Confirm with patient/family/NH records and if not entered, this has to be entered at [facility] point of entry) Patient coming from: Home  I have personally briefly reviewed patient's old medical records in [place]  Chief Complaint: I do not know what happened.  HPI: Jill Phillips is a 65 y.o. female with medical history significant of hip OA on fentanyl patch, HTN, COPD, cigarette smoker, HLD, hypothyroidism, anxiety/depression, presented with overdose and altered mentations.  Patient unable to give any history, try to reach patient daughter over the phone but she is not picking up and that left her message.  As of now, will history provided by ED staff and physician and review of her record.  ED physician Dr. Francia Greaves who was able to talk to patient's both daughters said that patient was supposed to be only on 1 fentanyl patch every 72 hours however continues to complain about increasing hip pain, yesterday, 1 of daughters gave her additional fentanyl patch and patient has been remained sleepy throughout the day to today.  Not waking up this afternoon since yesterday, daughter became concerned and called EMS.  EMS arrived found patient barely arousable and patient was given 8 mg of intranasal Narcan, patient woke up and remained lethargic through the trip to ED.  ED Course: Patient remained lethargic arousable, confused.  Vital signs, blood pressure on the lower side, no tachycardia, no hypoxia.  WBC 12.7, CT head negative for acute findings.  Review of Systems: Unable to perform, patient lethargic  Past Medical History:  Diagnosis Date  . Active smoker   . Anxiety   . Calcifying tendinitis of shoulder   . Chronic back pain   . Chronic pain syndrome   . COPD (chronic obstructive pulmonary disease) ([place])   . Dysthymic disorder   . Emphysema lung ([place])   . GERD  (gastroesophageal reflux disease)   . Headache    "weekly maybe" (01/31/2016)  . Heart murmur    for years, nothing to be concerned about  . Herpes genitalia   . History of blood transfusion 1980   related to "back surgery"  . Hyperlipidemia   . Hypertension   . Hypothyroidism   . Lumbago   . Osteoarthrosis, unspecified whether generalized or localized, lower leg   . Pain in joint, upper arm   . Pneumothorax, left 01/31/2016   S/P Left posterior subcostal pain injection on 01/30/2016  . PONV (postoperative nausea and vomiting)    gets nauseous  with longer surgery. Difficuty voiding after surgery  . Postlaminectomy syndrome, thoracic region   . Primary localized osteoarthrosis, lower leg   . Restless leg syndrome   . Sleep apnea    s/p surgery- last sleep study 2011- doesnt use oxygen or machine at night as instructed,.   12/2014- Dr Halford Chessman  reports it is negative.  . Thyroid disease     Past Surgical History:  Procedure Laterality Date  . APPENDECTOMY    . BACK SURGERY     18 back surgeries (2 thoracic & 16 lumbar) (01/31/2016)  . DILATION AND CURETTAGE OF UTERUS    . HAMMER TOE SURGERY    . IR RADIOLOGIST EVAL & MGMT  07/21/2017  . JOINT REPLACEMENT    . KNEE ARTHROSCOPY Right   . LAPAROSCOPIC CHOLECYSTECTOMY    . LUMBAR FUSION N/A 08/01/2015   Procedure: Right sided L1-2 and L2-3 transforaminal  lumbar interbody fusion with cages, Extension of posterior fusion T12 to L3, Replaced pedicle screws bilaterally L1-L2 , Replaced left sided pedicle screws L-3. Instrumentation T12 to L3 using local bone graft, Vivigen allograft and cancellous chips;  Surgeon: [name], MD;  Location: [facility];  Service: Orthopedics;  Laterality: N/A;  . LUMBAR LAMINECTOMY/DECOMPRESSION MICRODISCECTOMY N/A 01/28/2014   Procedure: Minimally Invasive Right  L1-2 Microdiscectomy;  Surgeon: [name], MD;  Location: [facility];  Service: Orthopedics;  Laterality: N/A;  . REVERSE SHOULDER ARTHROPLASTY Left  09/09/2019   Procedure: LEFT REVERSE SHOULDER REPLACEMENT;  Surgeon: Meredith Pel, MD;  Location: [facility];  Service: Orthopedics;  Laterality: Left;  . TOTAL HIP ARTHROPLASTY Right   . TOTAL KNEE ARTHROPLASTY  05/29/2012   Procedure: TOTAL KNEE ARTHROPLASTY;  Surgeon: Mcarthur Rossetti, MD;  Location: [facility];  Service: Orthopedics;  Laterality: Right;  Right Total Knee Arthroplasty  . TUBAL LIGATION    . UVULOPALATOPHARYNGOPLASTY    . VAGINAL HYSTERECTOMY       reports that she has been smoking cigarettes. She has a 56.25 pack-year smoking history. She has never used smokeless tobacco. She reports that she does not drink alcohol and does not use drugs.  Allergies  Allergen Reactions  . Flagyl [Metronidazole] Diarrhea  . Limonene Itching    Yeast infection in mouth  . Sulfa Antibiotics Other (See Comments)    Yeast infection in mouth  . Doxycycline     Mouth soreness - reaction vs thrush?  [name] Amoxicillin Other (See Comments)    REACTION: Oral yeast infection Has patient had a PCN reaction causing immediate rash, facial/tongue/throat swelling, SOB or lightheadedness with hypotension: No Has patient had a PCN reaction causing severe rash involving mucus membranes or skin necrosis: No Has patient had a PCN reaction that required hospitalization No Has patient had a PCN reaction occurring within the last 10 years: No If all of the above answers are "NO", then may proceed with Cephalosporin use.   . Chlorzoxazone Other (See Comments)    headache  . Codeine Other (See Comments)    headache  . Darvocet [Propoxyphene N-Acetaminophen] Itching  . Dilaudid [Hydromorphone Hcl] Itching  . Keflex [Cephalexin] Other (See Comments)    Pt does not recall reaction (maybe yeast infection)  . Morphine And Related Itching  . Nitrofurantoin Monohyd Macro Hives    Reaction to [employer]  . Percocet [Oxycodone-Acetaminophen] Itching    Patient can tolerate Acetaminophen solely  . Trazodone And  Nefazodone Palpitations    Family History  Problem Relation Age of Onset  . Kidney disease Mother   . Heart disease Father   . Anuerysm Brother 29       brain  . Heart disease Brother   . Heart disease Sister 98       s/p CABG  . Hypertension Sister   . Colon cancer Neg Hx      Prior to Admission medications   Medication Sig Start Date End Date Taking? Authorizing Provider  acetaminophen (TYLENOL) 500 MG tablet Take 1,000 mg by mouth every 6 (six) hours as needed (for headaches.).    [provider]  albuterol (PROAIR HFA) 108 (90 Base) MCG/ACT inhaler INHALE 2 PUFFS INTO THE LUNGS EVERY 6 HOURS AS NEEDED FOR WHEEZING OR SHORTNESS OF BREATH. 11/23/20   Burns, Claudina Lick, MD  amLODipine-benazepril (LOTREL) 10-40 MG capsule TAKE 1 CAPSULE BY MOUTH DAILY. 10/09/20   [name], MD  atorvastatin (LIPITOR) 20 MG tablet  TAKE 1 TABLET BY MOUTH DAILY. 02/02/21   [name], MD  cimetidine (TAGAMET) 400 MG tablet Take 400 mg by mouth 2 (two) times daily. 08/24/19   [provider]  clonazePAM (KLONOPIN) 1 MG tablet TAKE 1 TABLET BY MOUTH 2 TIMES DAILY AS NEEDED FOR ANXIETY. 01/31/21   [name], MD  docusate sodium (COLACE) 100 MG capsule Take 200 mg by mouth at bedtime.    [provider]  fentaNYL 37.5 MCG/HR PT72 Apply 1 patch topically every 3 (three) days. 05/31/20   [provider]  gabapentin (NEURONTIN) 400 MG capsule TAKE 1 CAPSULE BY MOUTH 3 TIMES DAILY. 01/05/21   [name], MD  HYDROcodone-acetaminophen (NORCO) 10-325 MG tablet Take 1 tablet by mouth every 6 (six) hours as needed for severe pain (Chronic back pain). 03/15/20   [name], MD  levothyroxine (SYNTHROID) 88 MCG tablet TAKE 1 TABLET BY MOUTH DAILY. 08/21/20   [name], MD  Methen-Hyosc-Meth Blue-Na Phos (ME/NAPHOS/MB/HYO1) 81.6 MG TABS Take 1 tablet by mouth 3 (three) times daily. 01/13/20   [provider]  NARCAN 4 MG/0.1ML LIQD nasal spray kit Place 1 spray  into the nose as needed (accidental overdose). 03/15/20   [name], MD  nicotine (NICODERM CQ - DOSED IN MG/24 HOURS) 21 mg/24hr patch Place 1 patch (21 mg total) onto the skin daily. 03/26/20   [name], MD  pantoprazole (PROTONIX) 40 MG tablet Take 1 tablet (40 mg total) by mouth 2 (two) times daily before a meal. 01/10/21   Burns, Claudina Lick, MD  QUEtiapine (SEROQUEL) 100 MG tablet Take 1.5 tablets (150 mg total) by mouth at bedtime. 07/05/20   [name], MD  rOPINIRole (REQUIP) 0.5 MG tablet Take 0.5 mg by mouth 4 (four) times daily as needed. 01/17/20   [provider]  Sennosides (SENNA LAX PO) Take 2 tablets by mouth at bedtime. 05/20/17   [provider]  SPIRIVA HANDIHALER 18 MCG inhalation capsule PLACE 1 CAPSULE INTO INHALER AND INHALE DAILY. 06/09/20   [name], MD  tiZANidine (ZANAFLEX) 4 MG tablet TAKE 1 TABLET BY MOUTH EVERY 8 HOURS AS NEEDED 08/21/20   [name], MD  triamterene-hydrochlorothiazide (MAXZIDE-25) 37.5-25 MG tablet Take 1 tablet by mouth daily. 11/28/20   [provider]  valACYclovir (VALTREX) 500 MG tablet TAKE 1 TABLET (500 MG TOTAL) BY MOUTH DAILY. 12/21/20   [name], MD  venlafaxine XR (EFFEXOR-XR) 150 MG 24 hr capsule TAKE 2 CAPSULES BY MOUTH DAILY. NEED OFFICE VISIT FOR REFILLS 02/02/21   [name], MD    Physical Exam: Vitals:   02/04/21 1805 02/04/21 1810 02/04/21 1815 02/04/21 1825  BP: (!) 89/56 (!) 94/54 (!) 89/50 (!) 90/50  Pulse: 92 91 89 88  Resp: _0 Temp:      TempSrc:      SpO2: 94% 95% 94% 93%    Constitutional: NAD, calm, comfortable Vitals:   02/04/21 1805 02/04/21 1810 02/04/21 1815 02/04/21 1825  BP: (!) 89/56 (!) 94/54 (!) 89/50 (!) 90/50  Pulse: 92 91 89 88  Resp: _1 Temp:      TempSrc:      SpO2: 94% 95% 94% 93%   Eyes: Pupils about 4 mm, sluggish to light, lids and conjunctivae normal ENMT: Mucous membranes are dry. Posterior pharynx clear of any exudate  or lesions.Normal dentition.  Neck: normal, supple, no masses, no  thyromegaly Respiratory: clear to auscultation bilaterally, no wheezing, no crackles. Normal respiratory effort. No accessory muscle use.  Cardiovascular: Regular rate and rhythm, no murmurs / rubs / gallops. No extremity edema. 2+ pedal pulses. No carotid bruits.  Abdomen: no tenderness, no masses palpated. No hepatosplenomegaly. Bowel sounds positive.  Musculoskeletal: no clubbing / cyanosis. No joint deformity upper and lower extremities. Good ROM, no contractures. Normal muscle tone.  Skin: no rashes, lesions, ulcers. No induration Neurologic: No facial droops, moving limbs, not following command Psychiatric: Easy to arouse, lethargic and confused    Labs on Admission: I have personally reviewed following labs and imaging studies  CBC: Recent Labs  Lab 02/04/21 1553 02/04/21 1735  WBC 12.7*  --   NEUTROABS 10.2*  --   HGB 12.5 11.6*  HCT 39.3 34.0*  MCV 97.3  --   PLT 234  --    Basic Metabolic Panel: Recent Labs  Lab 02/04/21 1553 02/04/21 1735  NA 141 142  K 4.4 3.8  CL 106  --   CO2 22  --   GLUCOSE 100*  --   BUN 48*  --   CREATININE 6.37*  --   CALCIUM 8.5*  --    GFR: CrCl cannot be calculated (Unknown ideal weight.). Liver Function Tests: Recent Labs  Lab 02/04/21 1553  AST 41  ALT 15  ALKPHOS 129*  BILITOT 1.0  PROT 7.0  ALBUMIN 3.8   No results for input(s): LIPASE, AMYLASE in the last 168 hours. Recent Labs  Lab 02/04/21 1553  AMMONIA 33   Coagulation Profile: No results for input(s): INR, PROTIME in the last 168 hours. Cardiac Enzymes: Recent Labs  Lab 02/04/21 1553  CKTOTAL 2,576*   BNP (last 3 results) No results for input(s): PROBNP in the last 8760 hours. HbA1C: No results for input(s): HGBA1C in the last 72 hours. CBG: Recent Labs  Lab 02/04/21 1558  GLUCAP 103*   Lipid Profile: No results for input(s): CHOL, HDL, LDLCALC, TRIG, CHOLHDL, LDLDIRECT in the  last 72 hours. Thyroid Function Tests: No results for input(s): TSH, T4TOTAL, FREET4, T3FREE, THYROIDAB in the last 72 hours. Anemia Panel: No results for input(s): VITAMINB12, FOLATE, FERRITIN, TIBC, IRON, RETICCTPCT in the last 72 hours. Urine analysis:    Component Value Date/Time   COLORURINE Dark Orange (A) 12/30/2019 1334   APPEARANCEUR Sl Cloudy (A) 12/30/2019 1334   APPEARANCEUR Clear 01/01/2018 1200   LABSPEC Color Interference (A) 12/30/2019 1334   PHURINE Color Interference (A) 12/30/2019 1334   GLUCOSEU Color Interference (A) 12/30/2019 1334   HGBUR Color Interference (A) 12/30/2019 1334   BILIRUBINUR negative 06/20/2020 1222   BILIRUBINUR Negative 01/01/2018 1200   KETONESUR Color Interference (A) 12/30/2019 1334   PROTEINUR Positive (A) 06/20/2020 1222   PROTEINUR NEGATIVE 09/07/2019 1334   UROBILINOGEN 0.2 06/20/2020 1222   UROBILINOGEN Color Interference (A) 12/30/2019 1334   NITRITE negative 06/20/2020 1222   NITRITE Color Interference (A) 12/30/2019 1334   LEUKOCYTESUR Negative 06/20/2020 1222   LEUKOCYTESUR Color Interference (A) 12/30/2019 1334    Radiological Exams on Admission: CT Head Wo Contrast  Result Date: 02/04/2021 CLINICAL DATA:  Possible overdose. EXAM: CT HEAD WITHOUT CONTRAST TECHNIQUE: Contiguous axial images were obtained from the base of the skull through the vertex without intravenous contrast. COMPARISON:  October 15, 2014 FINDINGS: Brain: There is mild cerebral atrophy with widening of the extra-axial spaces. This is more prominent throughout the left hemisphere and is stable in appearance when compared to the prior  study. There are areas of very mildly decreased attenuation within the white matter tracts of the supratentorial brain, consistent with microvascular disease changes. Vascular: No hyperdense vessel or unexpected calcification. Skull: Normal. Negative for fracture or focal lesion. Sinuses/Orbits: No acute finding. Other: None.  IMPRESSION: Stable exam without an acute intracranial abnormality. Electronically Signed   By: Virgina Norfolk M.D.   On: 02/04/2021 16:43   MR Hip Left w/o contrast  Result Date: 02/04/2021 CLINICAL DATA:  Left hip pain over the last 3 months EXAM: MR OF THE LEFT HIP WITHOUT CONTRAST TECHNIQUE: Multiplanar, multisequence MR imaging was performed. No intravenous contrast was administered. COMPARISON:  Radiographs 01/11/2021 and MRI from 05/22/2017 FINDINGS: Bones: Postoperative findings in the lumbar spine with multilevel lower lumbar fusion and posterior decompression. Metal artifact from right total hip prosthesis noted. Likely graft harvest site from the right iliac bone. Mild degenerative findings along the right SI joint on image 4 series 3. Mild spurring of the left acetabulum. Articular cartilage and labrum Articular cartilage:  No chondral defect identified. Labrum: Borderline prominence of the anterior superior labral recess on image 9 series 6, but given that this linear signal is only visible on a single image it probably represents recess rather than a tear along the recess. Joint or bursal effusion Joint effusion:  Absent Bursae: No regional bursitis. Muscles and tendons Muscles and tendons: 2.7 by 1.8 by 3.2 cm region of faintly abnormally accentuated T2 signal within the gluteus minimus muscle on image 24 series 5, without associated T1 signal abnormality. This region of apparent edema is somewhat focal and nonspecific but could reflect a muscle strain. Other findings Miscellaneous:   No supplemental non-categorized findings. IMPRESSION: 1. Localized edema in a portion of the gluteus minimus muscle, muscle strain not excluded. 2. Multilevel lower lumbar fusion with posterior decompression. 3. Borderline prominence of the recess of the left anterior superior labrum but I do not see a definite labral tear. 4. Right total hip prosthesis, with likely graft harvest site from the right iliac bone. 5.  Mild degenerative findings along the inferior aspect of the right SI joint. Electronically Signed   By: Van Clines M.D.   On: 02/04/2021 09:20   DG Chest Port 1 View  Result Date: 02/04/2021 CLINICAL DATA:  Weakness, drug overdose EXAM: PORTABLE CHEST 1 VIEW COMPARISON:  Portable exam 1610 hours compared to 04/21/2017 FINDINGS: Rotated to the RIGHT. Normal heart size mediastinal contours. Bronchitic changes and chronic accentuation of perihilar markings, stable. No acute infiltrate, pleural effusion, or pneumothorax. Prior thoracolumbar spinal fixation and LEFT shoulder reverse arthroplasty. IMPRESSION: Chronic bronchitic changes. No acute abnormalities. Electronically Signed   By: Lavonia Dana M.D.   On: 02/04/2021 16:16    EKG: Independently reviewed. Sinus, T wave flattening on multiple leads  Assessment/Plan Active Problems:   AKI (acute kidney injury) ([place])   Overdose  (please populate well all problems here in Problem List. (For example, if patient is on BP meds at home and you resume or decide to hold them, it is a problem that needs to be her. Same for CAD, COPD, HLD and so on)  Acute toxic encephalopathy -Secondary to fentanyl overdose, s/p Narcan -Mentation appears to have improved. Will admit to PCU for close monitor, indeed additional Narcan if mentation deteriorated.  Hypotension -Severe hypovolemia and dehydration -IV boluses and then high rate of IV fluid infusion. -Unable to determine whether patient is septic currently, SUV not available but patient blood pressure somewhat low, and same time  has leukocytosis, will give 1 dose of ceftriaxone and depends on UA finding to decide whether to continue tomorrow. -Admit to PCU for close monitoring.  Hold all BP meds.  AKI -Signs of severe hypovolemia and dehydration -Received IV boluses x2 and then high rate of IV fluid for tonight hold all BP meds. -Ordered renal ultrasound to rule out obstructions -Foley catheter for  accurate input output -Nephrology consulted, recommend IV fluid, reconsult nephrology tomorrow if indicated.  Hypothyroidism, anxiety depression GERD -Continue home meds.   DVT prophylaxis: Heparin subcu code Status: Full Code Family Communication: Left daughter message Disposition Plan: Expect more than 2 midnight hospital stay to treat overdose and AKI. Consults called: Nephrology Dr. Jonnie Finner, reconsult tomorrow if indicated Admission status: PCU   Lequita Halt MD [facility] Pager [phone]  02/04/2021, 6:27 PM

## 2021-02-04 NOTE — Progress Notes (Signed)
Re-visited patient, and patient is awake, significant improvement of mentation, answered all questions properly. Foley placed in and drained immediate 50 ml orange colored urine. Patient reported that she has been having dysuria and has been taking over the counter Uricalm for last 3 days.   Will make Ceftriaxone 5 days for UTI.  BP now is 105/55.

## 2021-02-04 NOTE — ED Triage Notes (Signed)
PT BIB EMS due to possible overdose. EMS states pt was agitated on scene. Pt has a hx of restless leg syndrome and takes fentyl patches for chronic back pain. Pt is alert on arrival and agitated. Pt has slurred speech. Pt received 8mg  of narcan intranasal.

## 2021-02-04 NOTE — ED Notes (Signed)
Pt's BP decreased to 89/56, Messick MD aware. Fluid bolus infusing

## 2021-02-04 NOTE — ED Notes (Signed)
Pt placed on 2L via nasal cannula, ETCO2 monitoring applied.

## 2021-02-04 NOTE — ED Notes (Signed)
Patient transported to CT 

## 2021-02-05 DIAGNOSIS — I959 Hypotension, unspecified: Secondary | ICD-10-CM

## 2021-02-05 DIAGNOSIS — G9340 Encephalopathy, unspecified: Secondary | ICD-10-CM

## 2021-02-05 LAB — BASIC METABOLIC PANEL
Anion gap: 5 (ref 5–15)
BUN: 26 mg/dL — ABNORMAL HIGH (ref 8–23)
CO2: 18 mmol/L — ABNORMAL LOW (ref 22–32)
Calcium: 5.8 mg/dL — CL (ref 8.9–10.3)
Chloride: 124 mmol/L — ABNORMAL HIGH (ref 98–111)
Creatinine, Ser: 2.85 mg/dL — ABNORMAL HIGH (ref 0.44–1.00)
GFR, Estimated: 18 mL/min — ABNORMAL LOW (ref >60)
Glucose, Bld: 58 mg/dL — ABNORMAL LOW (ref 70–99)
Potassium: 3.4 mmol/L — ABNORMAL LOW (ref 3.5–5.1)
Sodium: 147 mmol/L — ABNORMAL HIGH (ref 135–145)

## 2021-02-05 LAB — CK: Total CK: 1768 U/L — ABNORMAL HIGH (ref 38–234)

## 2021-02-05 LAB — CBC
HCT: 28.6 % — ABNORMAL LOW (ref 36.0–46.0)
Hemoglobin: 8.7 g/dL — ABNORMAL LOW (ref 12.0–15.0)
MCH: 30.7 pg (ref 26.0–34.0)
MCHC: 30.4 g/dL (ref 30.0–36.0)
MCV: 101.1 fL — ABNORMAL HIGH (ref 80.0–100.0)
Platelets: 171 10*3/uL (ref 150–400)
RBC: 2.83 MIL/uL — ABNORMAL LOW (ref 3.87–5.11)
RDW: 16.2 % — ABNORMAL HIGH (ref 11.5–15.5)
WBC: 6.3 10*3/uL (ref 4.0–10.5)
nRBC: 0 % (ref 0.0–0.2)

## 2021-02-05 LAB — MAGNESIUM: Magnesium: 2.2 mg/dL (ref 1.7–2.4)

## 2021-02-05 MED ORDER — CLONAZEPAM 0.5 MG PO TBDP
1.0000 mg | ORAL_TABLET | Freq: Two times a day (BID) | ORAL | Status: DC | PRN
  Administered 2021-02-06: 1 mg via ORAL
  Filled 2021-02-05: qty 2

## 2021-02-05 MED ORDER — FENTANYL 25 MCG/HR TD PT72
1.0000 | MEDICATED_PATCH | TRANSDERMAL | Status: DC
  Administered 2021-02-05: 1 via TRANSDERMAL
  Filled 2021-02-05: qty 1

## 2021-02-05 MED ORDER — CALCIUM GLUCONATE-NACL 1-0.675 GM/50ML-% IV SOLN
1.0000 g | Freq: Once | INTRAVENOUS | Status: AC
  Administered 2021-02-05: 1000 mg via INTRAVENOUS
  Filled 2021-02-05: qty 50

## 2021-02-05 MED ORDER — ATORVASTATIN CALCIUM 10 MG PO TABS
20.0000 mg | ORAL_TABLET | Freq: Every day | ORAL | Status: DC
  Administered 2021-02-05 – 2021-02-06 (×2): 20 mg via ORAL
  Filled 2021-02-05 (×2): qty 2

## 2021-02-05 MED ORDER — FENTANYL 12 MCG/HR TD PT72
1.0000 | MEDICATED_PATCH | TRANSDERMAL | Status: DC
  Administered 2021-02-05: 1 via TRANSDERMAL
  Filled 2021-02-05: qty 1

## 2021-02-05 MED ORDER — POTASSIUM CHLORIDE CRYS ER 20 MEQ PO TBCR
40.0000 meq | EXTENDED_RELEASE_TABLET | Freq: Once | ORAL | Status: AC
  Administered 2021-02-05: 40 meq via ORAL
  Filled 2021-02-05: qty 2

## 2021-02-05 MED ORDER — CLONAZEPAM 0.25 MG PO TBDP
0.2500 mg | ORAL_TABLET | Freq: Two times a day (BID) | ORAL | Status: DC | PRN
  Administered 2021-02-05: 0.25 mg via ORAL
  Filled 2021-02-05: qty 1

## 2021-02-05 MED ORDER — PANTOPRAZOLE SODIUM 40 MG PO TBEC: 80.0000 mg | DELAYED_RELEASE_TABLET | Freq: Every day | ORAL | Status: DC

## 2021-02-05 MED ORDER — DARIFENACIN HYDROBROMIDE ER 15 MG PO TB24
15.0000 mg | ORAL_TABLET | Freq: Every day | ORAL | Status: DC
  Administered 2021-02-05 – 2021-02-06 (×2): 15 mg via ORAL
  Filled 2021-02-05 (×2): qty 1

## 2021-02-05 MED ORDER — HYDROCODONE-ACETAMINOPHEN 10-325 MG PO TABS
1.0000 | ORAL_TABLET | Freq: Four times a day (QID) | ORAL | Status: DC | PRN
  Administered 2021-02-05 – 2021-02-06 (×2): 1 via ORAL
  Filled 2021-02-05 (×3): qty 1

## 2021-02-05 NOTE — ED Notes (Signed)
Attempted to call report x2

## 2021-02-05 NOTE — ED Notes (Signed)
Attempted to call report. No answer.

## 2021-02-05 NOTE — ED Notes (Signed)
Attempted report x 2 

## 2021-02-05 NOTE — ED Notes (Signed)
Report attempted x 1

## 2021-02-05 NOTE — Progress Notes (Signed)
PROGRESS NOTE    Jill Phillips  BZJ:[id number] DOB: September 07, 1955 DOA: 02/04/2021 PCP: [name], MD     Brief Narrative:  Jill Phillips is a 65 y.o. female with medical history significant of hip OA on fentanyl patch, HTN, COPD, cigarette smoker, HLD, hypothyroidism, anxiety/depression, presented with unintentional narcotic overdose and altered mentation.  Per report, due to increase in her chronic hip pain, she was given an additional fentanyl patch by one of her daughters.  She had remained sleepy throughout the day, not waking up.  EMS was called at this time, was found to be barely arousable.  Patient was given 8 mg of intranasal Narcan for narcotic overdose.  CT head was negative for acute findings in the emergency department.  Throughout the course of ED stay, patient's mentation slowly returned to her baseline.  Patient was also found to be hypotensive with renal failure in the emergency department, given IV fluid.  New events last 24 hours / Subjective: Patient awake, alert, oriented x4.  Her main complaint is her hip pain which is chronic in nature.  She does not think that she had an additional dose of her fentanyl patch, says that she had a new patch placed on Friday, this was removed for her MRI scan, then the same used patch was placed back on her.   Assessment & Plan:   Principal Problem:   Acute encephalopathy Active Problems:   RLS (restless legs syndrome)   Tobacco abuse   HLD (hyperlipidemia)   Chronic, continuous use of opioids   GERD (gastroesophageal reflux disease)   Anxiety   Interstitial cystitis   AKI (acute kidney injury) (HCC)   Overdose   Hypotension   Acute toxic encephalopathy secondary to unintentional drug overdose -CT head stable -Status post Narcan and now resolved back to baseline  Hypotension -Secondary to hypovolemia, dehydration, narcotics -Looks like her leukocytosis was more due to hemoconcentration -Sepsis ruled out -Chest x-ray  negative -Urine culture pending -Improved  -Hold further antibiotics   AKI -Improving -Continue to monitor BMP -Hold off on nephrology consult as her creatinine has been improving with IV fluids -Renal ultrasound: Normal -Hold home ACE inhibitor and Maxide  Hypothyroidism -Continue Synthroid  Hyperlipidemia -Continue Lipitor  Hypokalemia -Replace  Hypocalcemia -Replace  Chronic pain -Patient is on fentanyl patch, Norco.  Pain medication managed by pain medicine specialist as outpatient -Follows with Dr. [name] for chronic hip pain.  MRI left hip done as outpatient, follow-up  Mood disorder -Continue Klonopin, Seroquel, Effexor.  Medication managed by her primary care physician   DVT prophylaxis:  heparin injection 5,000 Units Start: 02/04/21 2200  Code Status:     Code Status Orders  (From admission, onward)         Start     Ordered   02/04/21 1825  Full code  Continuous        02/04/21 1825        Code Status History    Date Active Date Inactive Code Status Order ID Comments User Context   09/09/2019 1744 09/10/2019 2003 Full Code [id number]  Rosalin Hawking Inpatient   01/31/2016 1611 02/04/2016 1658 Full Code [id number]  [name], MD ED   08/01/2015 1733 08/05/2015 1530 Full Code [id number]  [name], MD Inpatient   04/05/2015 0248 04/07/2015 1555 Full Code [id number]  Jani Gravel, MD Inpatient   07/06/2014 1235 07/08/2014 0404 Full Code [id number]  Dereck Ligas, [place]   01/28/2014 1527 01/29/2014  [place] Full Code [id number]  Loraine Maple Inpatient   05/29/2012 1650 06/01/2012 2016 Full Code [id number]  Hubert Azure, RN Inpatient   Advance Care Planning Activity     Family Communication: None at bedside, left voicemail for daughter to call back for updates  Disposition Plan:  Status is: Inpatient  Remains inpatient appropriate because:IV treatments appropriate due to intensity of illness or inability to take PO   Dispo: The patient is  from: Home              Anticipated d/c is to: Home              Patient currently is not medically stable to d/c.   Difficult to place patient No      Consultants:   none  Procedures:   none  Antimicrobials:  Anti-infectives (From admission, onward)   Start     Dose/Rate Route Frequency Ordered Stop   02/05/21 1800  cefTRIAXone (ROCEPHIN) 1 g in sodium chloride 0.9 % 100 mL IVPB        1 g 200 mL/hr over 30 Minutes Intravenous Every 24 hours 02/04/21 1911     02/04/21 1845  cefTRIAXone (ROCEPHIN) 2 g in sodium chloride 0.9 % 100 mL IVPB        2 g 200 mL/hr over 30 Minutes Intravenous  Once 02/04/21 1841 02/04/21 2011        Objective: Vitals:   02/05/21 0545 02/05/21 0630 02/05/21 0715 02/05/21 0745  BP: 105/63 91/73 (!) 95/54 90/61  Pulse: 82 78 78 82  Resp: 13 16 13 13   Temp:      TempSrc:      SpO2: 100% 92% 95% 95%  Weight:      Height:        Intake/Output Summary (Last 24 hours) at 02/05/2021 0947 Last data filed at 02/05/2021 0344 Gross per 24 hour  Intake 3100 ml  Output 1750 ml  Net 1350 ml   Filed Weights   02/05/21 0344  Weight: 65.8 kg    Examination:  General exam: Appears calm and comfortable  Respiratory system: Clear to auscultation. Respiratory effort normal. No respiratory distress. No conversational dyspnea.  Cardiovascular system: S1 & S2 heard, RRR. No murmurs. No pedal edema. Gastrointestinal system: Abdomen is nondistended, soft and nontender. Normal bowel sounds heard. Central nervous system: Alert and oriented. No focal neurological deficits. Speech clear.  Extremities: Symmetric in appearance  Skin: No rashes, lesions or ulcers on exposed skin  Psychiatry: Judgement and insight appear normal. Mood & affect appropriate.   Data Reviewed: I have personally reviewed following labs and imaging studies  CBC: Recent Labs  Lab 02/04/21 1553 02/04/21 1735 02/05/21 0500  WBC 12.7*  --  6.3  NEUTROABS 10.2*  --   --   HGB 12.5  11.6* 8.7*  HCT 39.3 34.0* 28.6*  MCV 97.3  --  101.1*  PLT 234  --  782   Basic Metabolic Panel: Recent Labs  Lab 02/04/21 1553 02/04/21 1735 02/05/21 0500  NA 141 142 147*  K 4.4 3.8 3.4*  CL 106  --  124*  CO2 22  --  18*  GLUCOSE 100*  --  58*  BUN 48*  --  26*  CREATININE 6.37*  --  2.85*  CALCIUM 8.5*  --  5.8*   GFR: Estimated Creatinine Clearance: 18.1 mL/min (A) (by C-G formula based on SCr of 2.85 mg/dL (H)). Liver Function Tests: Recent Labs  Lab 02/04/21  1553  AST 41  ALT 15  ALKPHOS 129*  BILITOT 1.0  PROT 7.0  ALBUMIN 3.8   No results for input(s): LIPASE, AMYLASE in the last 168 hours. Recent Labs  Lab 02/04/21 1553  AMMONIA 33   Coagulation Profile: No results for input(s): INR, PROTIME in the last 168 hours. Cardiac Enzymes: Recent Labs  Lab 02/04/21 1553 02/05/21 0500  CKTOTAL 2,576* 1,768*   BNP (last 3 results) No results for input(s): PROBNP in the last 8760 hours. HbA1C: No results for input(s): HGBA1C in the last 72 hours. CBG: Recent Labs  Lab 02/04/21 1558  GLUCAP 103*   Lipid Profile: No results for input(s): CHOL, HDL, LDLCALC, TRIG, CHOLHDL, LDLDIRECT in the last 72 hours. Thyroid Function Tests: No results for input(s): TSH, T4TOTAL, FREET4, T3FREE, THYROIDAB in the last 72 hours. Anemia Panel: No results for input(s): VITAMINB12, FOLATE, FERRITIN, TIBC, IRON, RETICCTPCT in the last 72 hours. Sepsis Labs: Recent Labs  Lab 02/04/21 1553  LATICACIDVEN 1.6    Recent Results (from the past 240 hour(s))  Resp Panel by RT-PCR (Flu A&B, Covid) Nasopharyngeal Swab     Status: None   Collection Time: 02/04/21  4:05 PM   Specimen: Nasopharyngeal Swab; Nasopharyngeal(NP) swabs in vial transport medium  Result Value Ref Range Status   SARS Coronavirus 2 by RT PCR NEGATIVE NEGATIVE Final    Comment: (NOTE) SARS-CoV-2 target nucleic acids are NOT DETECTED.  The SARS-CoV-2 RNA is generally detectable in upper  respiratory specimens during the acute phase of infection. The lowest concentration of SARS-CoV-2 viral copies this assay can detect is 138 copies/mL. A negative result does not preclude SARS-Cov-2 infection and should not be used as the sole basis for treatment or other patient management decisions. A negative result may occur with  improper specimen collection/handling, submission of specimen other than nasopharyngeal swab, presence of viral mutation(s) within the areas targeted by this assay, and inadequate number of viral copies(<138 copies/mL). A negative result must be combined with clinical observations, patient history, and epidemiological information. The expected result is Negative.  Fact Sheet for Patients:  EntrepreneurPulse.com.au  Fact Sheet for Healthcare Providers:  IncredibleEmployment.be  This test is no t yet approved or cleared by the [place] FDA and  has been authorized for detection and/or diagnosis of SARS-CoV-2 by FDA under an Emergency Use Authorization (EUA). This EUA will remain  in effect (meaning this test can be used) for the duration of the COVID-19 declaration under Section 564(b)(1) of the Act, 21 U.S.C.section 360bbb-3(b)(1), unless the authorization is terminated  or revoked sooner.       Influenza A by PCR NEGATIVE NEGATIVE Final   Influenza B by PCR NEGATIVE NEGATIVE Final    Comment: (NOTE) The Xpert Xpress SARS-CoV-2/FLU/RSV plus assay is intended as an aid in the diagnosis of influenza from Nasopharyngeal swab specimens and should not be used as a sole basis for treatment. Nasal washings and aspirates are unacceptable for Xpert Xpress SARS-CoV-2/FLU/RSV testing.  Fact Sheet for Patients: EntrepreneurPulse.com.au  Fact Sheet for Healthcare Providers: IncredibleEmployment.be  This test is not yet approved or cleared by the [place] FDA and has been  authorized for detection and/or diagnosis of SARS-CoV-2 by FDA under an Emergency Use Authorization (EUA). This EUA will remain in effect (meaning this test can be used) for the duration of the COVID-19 declaration under Section 564(b)(1) of the Act, 21 U.S.C. section 360bbb-3(b)(1), unless the authorization is terminated or revoked.  Performed at [facility],  [address], [place], [place] [address]       Radiology Studies: CT Head Wo Contrast  Result Date: 02/04/2021 CLINICAL DATA:  Possible overdose. EXAM: CT HEAD WITHOUT CONTRAST TECHNIQUE: Contiguous axial images were obtained from the base of the skull through the vertex without intravenous contrast. COMPARISON:  October 15, 2014 FINDINGS: Brain: There is mild cerebral atrophy with widening of the extra-axial spaces. This is more prominent throughout the left hemisphere and is stable in appearance when compared to the prior study. There are areas of very mildly decreased attenuation within the white matter tracts of the supratentorial brain, consistent with microvascular disease changes. Vascular: No hyperdense vessel or unexpected calcification. Skull: Normal. Negative for fracture or focal lesion. Sinuses/Orbits: No acute finding. Other: None. IMPRESSION: Stable exam without an acute intracranial abnormality. Electronically Signed   By: Virgina Norfolk M.D.   On: 02/04/2021 16:43   US RENAL  Result Date: 02/04/2021 CLINICAL DATA:  Acute renal injury. EXAM: RENAL / URINARY TRACT ULTRASOUND COMPLETE COMPARISON:  August 20, 2010 FINDINGS: Right Kidney: Renal measurements: 9.2 cm x 4.9 cm x 4.8 cm = volume: 113.6 mL. Echogenicity within normal limits. No mass or hydronephrosis visualized. Left Kidney: Renal measurements: 9.0 cm x 5.4 cm x 5.0 cm = volume: 126.4 mL. Echogenicity within normal limits. No mass or hydronephrosis visualized. Bladder: A Foley catheter is present within the urinary bladder (as per the ultrasound  technologist) Other: The study is limited secondary to limited ability of the patient to cooperate during the exam. IMPRESSION: Normal renal ultrasound. Electronically Signed   By: Virgina Norfolk M.D.   On: 02/04/2021 20:21   MR Hip Left w/o contrast  Result Date: 02/04/2021 CLINICAL DATA:  Left hip pain over the last 3 months EXAM: MR OF THE LEFT HIP WITHOUT CONTRAST TECHNIQUE: Multiplanar, multisequence MR imaging was performed. No intravenous contrast was administered. COMPARISON:  Radiographs 01/11/2021 and MRI from 05/22/2017 FINDINGS: Bones: Postoperative findings in the lumbar spine with multilevel lower lumbar fusion and posterior decompression. Metal artifact from right total hip prosthesis noted. Likely graft harvest site from the right iliac bone. Mild degenerative findings along the right SI joint on image 4 series 3. Mild spurring of the left acetabulum. Articular cartilage and labrum Articular cartilage:  No chondral defect identified. Labrum: Borderline prominence of the anterior superior labral recess on image 9 series 6, but given that this linear signal is only visible on a single image it probably represents recess rather than a tear along the recess. Joint or bursal effusion Joint effusion:  Absent Bursae: No regional bursitis. Muscles and tendons Muscles and tendons: 2.7 by 1.8 by 3.2 cm region of faintly abnormally accentuated T2 signal within the gluteus minimus muscle on image 24 series 5, without associated T1 signal abnormality. This region of apparent edema is somewhat focal and nonspecific but could reflect a muscle strain. Other findings Miscellaneous:   No supplemental non-categorized findings. IMPRESSION: 1. Localized edema in a portion of the gluteus minimus muscle, muscle strain not excluded. 2. Multilevel lower lumbar fusion with posterior decompression. 3. Borderline prominence of the recess of the left anterior superior labrum but I do not see a definite labral tear. 4.  Right total hip prosthesis, with likely graft harvest site from the right iliac bone. 5. Mild degenerative findings along the inferior aspect of the right SI joint. Electronically Signed   By: Van Clines M.D.   On: 02/04/2021 09:20   DG Chest [facility] 1 View  Result  Date: 02/04/2021 CLINICAL DATA:  Weakness, drug overdose EXAM: PORTABLE CHEST 1 VIEW COMPARISON:  Portable exam 1610 hours compared to 04/21/2017 FINDINGS: Rotated to the RIGHT. Normal heart size mediastinal contours. Bronchitic changes and chronic accentuation of perihilar markings, stable. No acute infiltrate, pleural effusion, or pneumothorax. Prior thoracolumbar spinal fixation and LEFT shoulder reverse arthroplasty. IMPRESSION: Chronic bronchitic changes. No acute abnormalities. Electronically Signed   By: Lavonia Dana M.D.   On: 02/04/2021 16:16      Scheduled Meds: . docusate sodium  200 mg Oral QHS  . gabapentin  100 mg Oral BID  . heparin  5,000 Units Subcutaneous Q8H  . levothyroxine  88 mcg Oral Daily  . nicotine  21 mg Transdermal Daily  . pantoprazole  40 mg Oral BID AC  . QUEtiapine  150 mg Oral QHS  . senna  1 tablet Oral QHS  . umeclidinium bromide  1 puff Inhalation Daily  . venlafaxine XR  150 mg Oral Q breakfast   Continuous Infusions: . sodium chloride 150 mL/hr at 02/05/21 0618  . cefTRIAXone (ROCEPHIN)  IV       LOS: 1 day      Time spent: 35 minutes   Dessa Phi, DO [facility] 02/05/2021, 9:47 AM   Available via Epic secure chat 7am-7pm After these hours, please refer to coverage provider listed on [email or web address]

## 2021-02-06 ENCOUNTER — Telehealth: Payer: Self-pay | Admitting: Internal Medicine

## 2021-02-06 ENCOUNTER — Other Ambulatory Visit (HOSPITAL_COMMUNITY): Payer: Self-pay

## 2021-02-06 DIAGNOSIS — N189 Chronic kidney disease, unspecified: Secondary | ICD-10-CM

## 2021-02-06 LAB — BASIC METABOLIC PANEL
Anion gap: 6 (ref 5–15)
BUN: 19 mg/dL (ref 8–23)
CO2: 23 mmol/L (ref 22–32)
Calcium: 7.9 mg/dL — ABNORMAL LOW (ref 8.9–10.3)
Chloride: 114 mmol/L — ABNORMAL HIGH (ref 98–111)
Creatinine, Ser: 1.57 mg/dL — ABNORMAL HIGH (ref 0.44–1.00)
GFR, Estimated: 36 mL/min — ABNORMAL LOW (ref >60)
Glucose, Bld: 103 mg/dL — ABNORMAL HIGH (ref 70–99)
Potassium: 4.5 mmol/L (ref 3.5–5.1)
Sodium: 143 mmol/L (ref 135–145)

## 2021-02-06 LAB — CBC
HCT: 34.8 % — ABNORMAL LOW (ref 36.0–46.0)
Hemoglobin: 10.7 g/dL — ABNORMAL LOW (ref 12.0–15.0)
MCH: 30.2 pg (ref 26.0–34.0)
MCHC: 30.7 g/dL (ref 30.0–36.0)
MCV: 98.3 fL (ref 80.0–100.0)
Platelets: 215 10*3/uL (ref 150–400)
RBC: 3.54 MIL/uL — ABNORMAL LOW (ref 3.87–5.11)
RDW: 16 % — ABNORMAL HIGH (ref 11.5–15.5)
WBC: 6.3 10*3/uL (ref 4.0–10.5)
nRBC: 0 % (ref 0.0–0.2)

## 2021-02-06 LAB — MAGNESIUM: Magnesium: 1.9 mg/dL (ref 1.7–2.4)

## 2021-02-06 MED ORDER — TIZANIDINE HCL 2 MG PO TABS
2.0000 mg | ORAL_TABLET | Freq: Three times a day (TID) | ORAL | 2 refills | Status: DC | PRN
  Filled 2021-02-06: qty 90, 30d supply, fill #0

## 2021-02-06 MED ORDER — GABAPENTIN 100 MG PO CAPS
100.0000 mg | ORAL_CAPSULE | Freq: Two times a day (BID) | ORAL | 2 refills | Status: DC
  Filled 2021-02-06: qty 60, 30d supply, fill #0

## 2021-02-06 NOTE — TOC Transition Note (Signed)
Transition of Care [facility]) - CM/SW Discharge Note   Patient Details  Name: Jill Phillips MRN: [id number] Date of Birth: April 11, 1956  [facility] of Care [facility]) CM/SW Contact:  Pollie Friar, RN Phone Number: 02/06/2021, 12:02 PM   Clinical Narrative:    Patient discharging home with self care. Pt states she lives with her daughter and has transport home.  Medications for home delivered from [place].    Final next level of care: Home/Self Care Barriers to Discharge: No Barriers Identified   Patient Goals and CMS Choice        Discharge Placement                       Discharge Plan and Services                                     Social Determinants of Health (SDOH) Interventions     Readmission Risk Interventions No flowsheet data found.

## 2021-02-06 NOTE — Plan of Care (Signed)

## 2021-02-06 NOTE — Progress Notes (Signed)
Patient discharged in stable condition via car with daughter. Discharge instructions were given and explained in detail and patient verbalizes understanding and denies further questions or concerns. Per MD Maylene Roes, foley catheter was removed with 500 cc orange color, clear with no sediments due to over the counter at home medication taken by patient. Patient tolerated foley removal well. Patient voided approximately 10 minutes after foley removal in the toilet, urine was yellow and clear in color with no sediments. Medications sent with patient that were given by [facility] pharmacy. Patient verbalized understanding of all medications.

## 2021-02-06 NOTE — Discharge Instructions (Signed)
Opioid Overdose Opioids are drugs that are often used to treat pain. Opioids include illegal drugs, such as heroin, as well as prescription pain medicines, such as codeine, morphine, hydrocodone, oxycodone, and fentanyl. An opioid overdose happens when you take too much of an opioid. An overdose may be intentional or accidental and can happen with any type of opioid. The effects of an overdose can be mild, dangerous, or even deadly. Opioid overdose is a medical emergency. What are the causes? This condition may be caused by:  Taking too much of an opioid on purpose.  Taking too much of an opioid by accident.  Using two or more substances that contain opioids at the same time.  Taking an opioid with a substance that affects your heart, breathing, or blood pressure. These include alcohol, tranquilizers, sleeping pills, illegal drugs, and some over-the-counter medicines. This condition may also happen due to an error made by:  A health care provider who prescribes a medicine.  The pharmacist who fills the prescription order. What increases the risk? This condition is more likely in:  Children. They may be attracted to colorful pills. Because of a child's small size, even a small amount of a drug can be dangerous.  Older people. They may be taking many different drugs. Older people may have difficulty reading labels or remembering when they last took their medicine. They may also be more sensitive to the effects of opioids.  People with chronic medical conditions, especially heart, liver, kidney, or neurological diseases.  People who take an opioid for a long period of time.  People who use: ? Illegal drugs. IV heroin is especially dangerous. ? Other substances, including alcohol, while using an opioid.  People who have: ? A history of drug or alcohol abuse. ? Certain mental health conditions. ? A history of previous drug overdoses.  People who take opioids that are not prescribed  for them. What are the signs or symptoms? Symptoms of this condition depend on the type of opioid and the amount that was taken. Common symptoms include:  Sleepiness or difficulty waking from sleep.  Decrease in attention.  Confusion.  Slurred speech.  Slowed breathing and a slow pulse (bradycardia).  Nausea and vomiting.  Abnormally small pupils. Signs and symptoms that require emergency treatment include:  Cold, clammy, and pale skin.  Blue lips and fingernails.  Vomiting.  Gurgling sounds in the throat.  A pulse that is very slow or difficult to detect.  Breathing that is very irregular, slow, noisy, or difficult to detect.  Limp body.  Inability to respond to speech or be awakened from sleep (stupor).  Seizures. How is this diagnosed? This condition is diagnosed based on your symptoms and medical history. It is important to tell your health care provider:  About all of the opioids that you took.  When you took the opioids.  Whether you were drinking alcohol or using marijuana, cocaine, or other drugs. Your health care provider will do a physical exam. This exam may include:  Checking and monitoring your heart rate and rhythm, breathing rate, temperature, and blood pressure (vital signs).  Measuring oxygen levels in your blood.  Checking for abnormally small pupils. You may also have blood tests or urine tests. You may have X-rays if you are having severe breathing problems. How is this treated? This condition requires immediate medical treatment and hospitalization. Treatment is given in the hospital intensive care (ICU) setting. Supporting your vital signs and your breathing is the first step in   treating an opioid overdose. Treatment may also include:  Giving salts and minerals (electrolytes) along with fluids through an IV.  Inserting a breathing tube (endotracheal tube) in your airway to help you breathe if you cannot breathe on your own or you are in  danger of not being able to breathe on your own.  Giving oxygen through a small tube under your nose.  Passing a tube through your nose and into your stomach (nasogastric tube, or NG tube) to empty your stomach.  Giving medicines that: ? Increase your blood pressure. ? Relieve nausea and vomiting. ? Relieve abdominal pain and cramping. ? Reverse the effects of the opioid (naloxone).  Monitoring your heart and oxygen levels.  Ongoing counseling and mental health support if you intentionally overdosed or used an illegal drug. Follow these instructions at home: Medicines  Take over-the-counter and prescription medicines only as told by your health care provider.  Always ask your health care provider about possible side effects and interactions of any new medicine that you start taking.  Keep a list of all the medicines that you take, including over-the-counter medicines. Bring this list with you to all your medical visits. General instructions  Drink enough fluid to keep your urine pale yellow.  Keep all follow-up visits as told by your health care provider. This is important.   How is this prevented?  Read the drug inserts that come with your opioid pain medicines.  Take medicines only as told by your health care provider. Do not take more medicine than you are told. Do not take medicines more frequently than you are told.  Do not drink alcohol or take sedatives when taking opioids.  Do not use illegal or recreational drugs, including cocaine, ecstasy, and marijuana.  Do not take opioid medicines that are not prescribed for you.  Store all medicines in safety containers that are out of the reach of children.  Get help if you are struggling with: ? Alcohol or drug use. ? Depression or another mental health problem. ? Thoughts of hurting yourself or another person.  Keep the phone number of your local poison control center near your phone or in your mobile phone. In the  [place] the hotline of the National Poison Control Center is (800) 222-1222.  If you were prescribed naloxone, make sure you understand how to take it. Contact a health care provider if you:  Need help understanding how to take your pain medicines.  Feel your medicines are too strong.  Are concerned that your pain medicines are not working well for your pain.  Develop new symptoms or side effects when you are taking medicines. Get help right away if:  You or someone else is having symptoms of an opioid overdose. Get help even if you are not sure.  You have serious thoughts about hurting yourself or others.  You have: ? Chest pain. ? Difficulty breathing. ? A loss of consciousness. These symptoms may represent a serious problem that is an emergency. Do not wait to see if the symptoms will go away. Get medical help right away. Call your local emergency services (911 in the [place] Do not drive yourself to the hospital. If you ever feel like you may hurt yourself or others, or have thoughts about taking your own life, get help right away. You can go to your nearest emergency department or call:  Your local emergency services (911 in the [place]  A suicide crisis helpline, such as the National Suicide Prevention Lifeline   at 1-800-273-8255. This is open 24 hours a day. Summary  Opioids are drugs that are often used to treat pain. Opioids include illegal drugs, such as heroin, as well as prescription pain medicines.  An opioid overdose happens when you take too much of an opioid.  Overdoses can be intentional or accidental.  Opioid overdose is very dangerous. It is a life-threatening emergency.  If you or someone you know is experiencing an opioid overdose, get help right away. This information is not intended to replace advice given to you by your health care provider. Make sure you discuss any questions you have with your health care provider. Document Revised: 08/06/2018 Document  Reviewed: 08/06/2018 [employer] Patient Education  2021 [employer]  

## 2021-02-06 NOTE — Discharge Summary (Signed)
Physician Discharge Summary  Jill Phillips ZOX:[id number] DOB: 04/22/1956 DOA: 02/04/2021  PCP: [name], MD  Admit date: 02/04/2021 Discharge date: 02/06/2021  Admitted From: Home Disposition:  Home  Recommendations for Outpatient Follow-up:  1. Follow up with PCP in 1 week 2. Follow up with Dr. [name] 3. Follow up with pain management clinic 4. Please obtain BMP in 1 week  5. Follow-up for final results of urine culture which was obtained in the hospital  Discharge Condition: Stable CODE STATUS: Full  Diet recommendation: Regular diet   Brief/Interim Summary: Jill Phillips a 65 y.o.femalewith medical history significant ofhip OA on fentanyl patch and norco, HTN, COPD, cigarette smoker, HLD, hypothyroidism, anxiety/depression, presented with unintentional narcotic overdose and altered mentation.  Per report, due to increase in her chronic hip pain, she was given an additional fentanyl patch by one of her daughters.  She had remained sleepy throughout the day, not waking up.  EMS was called at this time, was found to be barely arousable.  Patient was given 8 mg of intranasal Narcan for narcotic overdose.  CT head was negative for acute findings in the emergency department.  Throughout the course of ED stay, patient's mentation slowly returned to her baseline.  Patient was also found to be hypotensive with renal failure in the emergency department, given IV fluid.  Patient's mentation improved with Narcan.  Hypotension and AKI continue to improve with IV fluid.  On day of discharge, patient was back to her baseline and ready to be discharged home with close follow-up as outpatient.  Discharge Diagnoses:  Principal Problem:   Acute encephalopathy Active Problems:   RLS (restless legs syndrome)   Tobacco abuse   HLD (hyperlipidemia)   Chronic, continuous use of opioids   GERD (gastroesophageal reflux disease)   Anxiety   Interstitial cystitis   AKI (acute kidney injury) (HCC)    Overdose   Hypotension   Acute toxic encephalopathy secondary to unintentional drug overdose -CT head stable -Status post Narcan and now resolved back to baseline  Hypotension -Secondary to hypovolemia, dehydration, narcotics -Looks like her leukocytosis was more due to hemoconcentration -Sepsis ruled out -Chest x-ray negative -Urine culture pending -Improved  -Hold further antibiotics   AKI -Improving -Continue to monitor BMP -Hold off on nephrology consult as her creatinine has been improving with IV fluids -Renal ultrasound: Normal -Hold home ACE inhibitor and Maxide -Creatinine continues to improve, follow-up with repeat BMP as outpatient  Hypothyroidism -Continue Synthroid  Hyperlipidemia -Continue Lipitor  Chronic pain -Patient is on fentanyl patch, Norco.  Pain medication managed by pain medicine specialist as outpatient -Follows with Dr. [name] for chronic hip pain.  MRI left hip done as outpatient, follow-up  Mood disorder -Continue Klonopin, Seroquel, Effexor.  Medication managed by her primary care physician    Discharge Instructions  Discharge Instructions    Call MD for:  difficulty breathing, headache or visual disturbances   Complete by: As directed    Call MD for:  extreme fatigue   Complete by: As directed    Call MD for:  persistant dizziness or light-headedness   Complete by: As directed    Call MD for:  persistant nausea and vomiting   Complete by: As directed    Call MD for:  severe uncontrolled pain   Complete by: As directed    Call MD for:  temperature >100.4   Complete by: As directed    Discharge instructions   Complete by: As directed  You were cared for by a hospitalist during your hospital stay. If you have any questions about your discharge medications or the care you received while you were in the hospital after you are discharged, you can call the unit and ask to speak with the hospitalist on call if the hospitalist that  took care of you is not available. Once you are discharged, your primary care physician will handle any further medical issues. Please note that NO REFILLS for any discharge medications will be authorized once you are discharged, as it is imperative that you return to your primary care physician (or establish a relationship with a primary care physician if you do not have one) for your aftercare needs so that they can reassess your need for medications and monitor your lab values.   Increase activity slowly   Complete by: As directed      Allergies as of 02/06/2021      Reactions   Flagyl [metronidazole] Diarrhea   Limonene Itching   Yeast infection in mouth   Sulfa Antibiotics Other (See Comments)   Yeast infection in mouth   Doxycycline    Mouth soreness - reaction vs thrush?   Amoxicillin Other (See Comments)   REACTION: Oral yeast infection Has patient had a PCN reaction causing immediate rash, facial/tongue/throat swelling, SOB or lightheadedness with hypotension: No Has patient had a PCN reaction causing severe rash involving mucus membranes or skin necrosis: No Has patient had a PCN reaction that required hospitalization No Has patient had a PCN reaction occurring within the last 10 years: No If all of the above answers are "NO", then may proceed with Cephalosporin use.   Chlorzoxazone Other (See Comments)   headache   Codeine Other (See Comments)   headache   Darvocet [propoxyphene N-acetaminophen] Itching   Dilaudid [hydromorphone Hcl] Itching   Keflex [cephalexin] Other (See Comments)   Pt does not recall reaction (maybe yeast infection)   Morphine And Related Itching   Nitrofurantoin Monohyd Macro Hives   Reaction to [employer] [oxycodone-acetaminophen] Itching   Patient can tolerate Acetaminophen solely   Trazodone And Nefazodone Palpitations      Medication List    STOP taking these medications   amLODipine-benazepril 10-40 MG capsule Commonly known as:  LOTREL   triamterene-hydrochlorothiazide 37.5-25 MG tablet Commonly known as: MAXZIDE-25     TAKE these medications   acetaminophen 500 MG tablet Commonly known as: TYLENOL Take 1,000 mg by mouth every 6 (six) hours as needed (for headaches.).   albuterol 108 (90 Base) MCG/ACT inhaler Commonly known as: ProAir HFA INHALE 2 PUFFS INTO THE LUNGS EVERY 6 HOURS AS NEEDED FOR WHEEZING OR SHORTNESS OF BREATH. What changed:   how much to take  how to take this  when to take this  reasons to take this  additional instructions   atorvastatin 20 MG tablet Commonly known as: LIPITOR TAKE 1 TABLET BY MOUTH DAILY.   cimetidine 400 MG tablet Commonly known as: TAGAMET Take 400 mg by mouth in the morning.   clonazePAM 1 MG tablet Commonly known as: KLONOPIN TAKE 1 TABLET BY MOUTH 2 TIMES DAILY AS NEEDED FOR ANXIETY.   docusate sodium 100 MG capsule Commonly known as: COLACE Take 300 mg by mouth at bedtime.   fentaNYL 37.5 MCG/HR Pt72 Apply 1 patch topically every 3 (three) days.   gabapentin 100 MG capsule Commonly known as: NEURONTIN Take 1 capsule (100 mg total) by mouth 2 (two) times daily. What changed:  medication strength  how much to take  when to take this   HYDROcodone-acetaminophen 10-325 MG tablet Commonly known as: Norco Take 1 tablet by mouth every 6 (six) hours as needed for severe pain (Chronic back pain). What changed: how much to take   levothyroxine 88 MCG tablet Commonly known as: SYNTHROID TAKE 1 TABLET BY MOUTH DAILY. What changed: when to take this   Narcan 4 MG/0.1ML Liqd nasal spray kit Generic drug: naloxone Place 1 spray into the nose as needed (accidental overdose).   nicotine 21 mg/24hr patch Commonly known as: NICODERM CQ - dosed in mg/24 hours Place 1 patch (21 mg total) onto the skin daily.   omeprazole 40 MG capsule Commonly known as: PRILOSEC Take 40 mg by mouth 2 (two) times daily.   pantoprazole 40 MG tablet Commonly  known as: PROTONIX Take 1 tablet (40 mg total) by mouth 2 (two) times daily before a meal.   QUEtiapine 100 MG tablet Commonly known as: SEROQUEL Take 1.5 tablets (150 mg total) by mouth at bedtime.   rOPINIRole 0.5 MG tablet Commonly known as: REQUIP Take 0.5 mg by mouth 4 (four) times daily as needed (rls).   SENNA LAX PO Take 2 tablets by mouth at bedtime.   solifenacin 10 MG tablet Commonly known as: VESICARE Take 10 mg by mouth daily.   Spiriva HandiHaler 18 MCG inhalation capsule Generic drug: tiotropium PLACE 1 CAPSULE INTO INHALER AND INHALE DAILY.   tiZANidine 2 MG tablet Commonly known as: ZANAFLEX Take 1 tablet (2 mg total) by mouth every 8 (eight) hours as needed for muscle spasms. What changed:   medication strength  how much to take  reasons to take this   valACYclovir 500 MG tablet Commonly known as: VALTREX TAKE 1 TABLET (500 MG TOTAL) BY MOUTH DAILY.   venlafaxine XR 150 MG 24 hr capsule Commonly known as: EFFEXOR-XR TAKE 2 CAPSULES BY MOUTH DAILY. NEED OFFICE VISIT FOR REFILLS What changed: See the new instructions.       Follow-up Information    [name], MD. Schedule an appointment as soon as possible for a visit in 1 week(s).   Specialty: Internal Medicine Contact information: [place] [address] [phone]        [name], MD Follow up.   Specialty: Orthopedic Surgery Contact information: [address] [place] [address] [phone]        [facility] Follow up.   Why: Call and schedule for follow up              Allergies  Allergen Reactions  . Flagyl [Metronidazole] Diarrhea  . Limonene Itching    Yeast infection in mouth  . Sulfa Antibiotics Other (See Comments)    Yeast infection in mouth  . Doxycycline     Mouth soreness - reaction vs thrush?  [name] Amoxicillin Other (See Comments)    REACTION: Oral yeast infection Has patient had a PCN reaction causing  immediate rash, facial/tongue/throat swelling, SOB or lightheadedness with hypotension: No Has patient had a PCN reaction causing severe rash involving mucus membranes or skin necrosis: No Has patient had a PCN reaction that required hospitalization No Has patient had a PCN reaction occurring within the last 10 years: No If all of the above answers are "NO", then may proceed with Cephalosporin use.   . Chlorzoxazone Other (See Comments)    headache  . Codeine Other (See Comments)    headache  . Darvocet [  Propoxyphene N-Acetaminophen] Itching  . Dilaudid [Hydromorphone Hcl] Itching  . Keflex [Cephalexin] Other (See Comments)    Pt does not recall reaction (maybe yeast infection)  . Morphine And Related Itching  . Nitrofurantoin Monohyd Macro Hives    Reaction to [employer]  . Percocet [Oxycodone-Acetaminophen] Itching    Patient can tolerate Acetaminophen solely  . Trazodone And Nefazodone Palpitations    Consultations:  None    Procedures/Studies: CT Head Wo Contrast  Result Date: 02/04/2021 CLINICAL DATA:  Possible overdose. EXAM: CT HEAD WITHOUT CONTRAST TECHNIQUE: Contiguous axial images were obtained from the base of the skull through the vertex without intravenous contrast. COMPARISON:  October 15, 2014 FINDINGS: Brain: There is mild cerebral atrophy with widening of the extra-axial spaces. This is more prominent throughout the left hemisphere and is stable in appearance when compared to the prior study. There are areas of very mildly decreased attenuation within the white matter tracts of the supratentorial brain, consistent with microvascular disease changes. Vascular: No hyperdense vessel or unexpected calcification. Skull: Normal. Negative for fracture or focal lesion. Sinuses/Orbits: No acute finding. Other: None. IMPRESSION: Stable exam without an acute intracranial abnormality. Electronically Signed   By: Virgina Norfolk M.D.   On: 02/04/2021 16:43   US RENAL  Result  Date: 02/04/2021 CLINICAL DATA:  Acute renal injury. EXAM: RENAL / URINARY TRACT ULTRASOUND COMPLETE COMPARISON:  August 20, 2010 FINDINGS: Right Kidney: Renal measurements: 9.2 cm x 4.9 cm x 4.8 cm = volume: 113.6 mL. Echogenicity within normal limits. No mass or hydronephrosis visualized. Left Kidney: Renal measurements: 9.0 cm x 5.4 cm x 5.0 cm = volume: 126.4 mL. Echogenicity within normal limits. No mass or hydronephrosis visualized. Bladder: A Foley catheter is present within the urinary bladder (as per the ultrasound technologist) Other: The study is limited secondary to limited ability of the patient to cooperate during the exam. IMPRESSION: Normal renal ultrasound. Electronically Signed   By: Virgina Norfolk M.D.   On: 02/04/2021 20:21   MR Hip Left w/o contrast  Result Date: 02/04/2021 CLINICAL DATA:  Left hip pain over the last 3 months EXAM: MR OF THE LEFT HIP WITHOUT CONTRAST TECHNIQUE: Multiplanar, multisequence MR imaging was performed. No intravenous contrast was administered. COMPARISON:  Radiographs 01/11/2021 and MRI from 05/22/2017 FINDINGS: Bones: Postoperative findings in the lumbar spine with multilevel lower lumbar fusion and posterior decompression. Metal artifact from right total hip prosthesis noted. Likely graft harvest site from the right iliac bone. Mild degenerative findings along the right SI joint on image 4 series 3. Mild spurring of the left acetabulum. Articular cartilage and labrum Articular cartilage:  No chondral defect identified. Labrum: Borderline prominence of the anterior superior labral recess on image 9 series 6, but given that this linear signal is only visible on a single image it probably represents recess rather than a tear along the recess. Joint or bursal effusion Joint effusion:  Absent Bursae: No regional bursitis. Muscles and tendons Muscles and tendons: 2.7 by 1.8 by 3.2 cm region of faintly abnormally accentuated T2 signal within the gluteus minimus  muscle on image 24 series 5, without associated T1 signal abnormality. This region of apparent edema is somewhat focal and nonspecific but could reflect a muscle strain. Other findings Miscellaneous:   No supplemental non-categorized findings. IMPRESSION: 1. Localized edema in a portion of the gluteus minimus muscle, muscle strain not excluded. 2. Multilevel lower lumbar fusion with posterior decompression. 3. Borderline prominence of the recess of the left anterior superior labrum but  I do not see a definite labral tear. 4. Right total hip prosthesis, with likely graft harvest site from the right iliac bone. 5. Mild degenerative findings along the inferior aspect of the right SI joint. Electronically Signed   By: Van Clines M.D.   On: 02/04/2021 09:20   DG Chest Port 1 View  Result Date: 02/04/2021 CLINICAL DATA:  Weakness, drug overdose EXAM: PORTABLE CHEST 1 VIEW COMPARISON:  Portable exam 1610 hours compared to 04/21/2017 FINDINGS: Rotated to the RIGHT. Normal heart size mediastinal contours. Bronchitic changes and chronic accentuation of perihilar markings, stable. No acute infiltrate, pleural effusion, or pneumothorax. Prior thoracolumbar spinal fixation and LEFT shoulder reverse arthroplasty. IMPRESSION: Chronic bronchitic changes. No acute abnormalities. Electronically Signed   By: Lavonia Dana M.D.   On: 02/04/2021 16:16   XR HIPS BILAT W OR W/O PELVIS 3-4 VIEWS  Result Date: 01/11/2021 AP and lateral both hips demonstrate right bipolar hemiarthroplasty, the femoral stem is well seated without lucency or loosening. The hip joint with mild narrowing of the joint line but no significant sclerosis. The left hip is showing minimal medial osteophyte joint line narrowing. No acute changes.       Discharge Exam: Vitals:   02/06/21 0316 02/06/21 0750  BP: (!) 124/59 139/88  Pulse: 76 79  Resp: 20 16  Temp: 97.8 F (36.6 C) 99.1 F (37.3 C)  SpO2:  94%    General: Pt is alert, awake,  not in acute distress Cardiovascular: RRR, S1/S2 +, no edema Respiratory: CTA bilaterally, no wheezing, no rhonchi, no respiratory distress, no conversational dyspnea  Abdominal: Soft, NT, ND, bowel sounds + Extremities: no edema, no cyanosis Psych: Normal mood and affect, stable judgement and insight     The results of significant diagnostics from this hospitalization (including imaging, microbiology, ancillary and laboratory) are listed below for reference.     Microbiology: Recent Results (from the past 240 hour(s))  Resp Panel by RT-PCR (Flu A&B, Covid) Nasopharyngeal Swab     Status: None   Collection Time: 02/04/21  4:05 PM   Specimen: Nasopharyngeal Swab; Nasopharyngeal(NP) swabs in vial transport medium  Result Value Ref Range Status   SARS Coronavirus 2 by RT PCR NEGATIVE NEGATIVE Final    Comment: (NOTE) SARS-CoV-2 target nucleic acids are NOT DETECTED.  The SARS-CoV-2 RNA is generally detectable in upper respiratory specimens during the acute phase of infection. The lowest concentration of SARS-CoV-2 viral copies this assay can detect is 138 copies/mL. A negative result does not preclude SARS-Cov-2 infection and should not be used as the sole basis for treatment or other patient management decisions. A negative result may occur with  improper specimen collection/handling, submission of specimen other than nasopharyngeal swab, presence of viral mutation(s) within the areas targeted by this assay, and inadequate number of viral copies(<138 copies/mL). A negative result must be combined with clinical observations, patient history, and epidemiological information. The expected result is Negative.  Fact Sheet for Patients:  EntrepreneurPulse.com.au  Fact Sheet for Healthcare Providers:  IncredibleEmployment.be  This test is no t yet approved or cleared by the [place] FDA and  has been authorized for detection and/or diagnosis of  SARS-CoV-2 by FDA under an Emergency Use Authorization (EUA). This EUA will remain  in effect (meaning this test can be used) for the duration of the COVID-19 declaration under Section 564(b)(1) of the Act, 21 U.S.C.section 360bbb-3(b)(1), unless the authorization is terminated  or revoked sooner.       Influenza A by  PCR NEGATIVE NEGATIVE Final   Influenza B by PCR NEGATIVE NEGATIVE Final    Comment: (NOTE) The Xpert Xpress SARS-CoV-2/FLU/RSV plus assay is intended as an aid in the diagnosis of influenza from Nasopharyngeal swab specimens and should not be used as a sole basis for treatment. Nasal washings and aspirates are unacceptable for Xpert Xpress SARS-CoV-2/FLU/RSV testing.  Fact Sheet for Patients: EntrepreneurPulse.com.au  Fact Sheet for Healthcare Providers: IncredibleEmployment.be  This test is not yet approved or cleared by the [place] FDA and has been authorized for detection and/or diagnosis of SARS-CoV-2 by FDA under an Emergency Use Authorization (EUA). This EUA will remain in effect (meaning this test can be used) for the duration of the COVID-19 declaration under Section 564(b)(1) of the Act, 21 U.S.C. section 360bbb-3(b)(1), unless the authorization is terminated or revoked.  Performed at [facility], [place] [address] [place], [place] [address]      Labs: BNP (last 3 results) No results for input(s): BNP in the last 8760 hours. Basic Metabolic Panel: Recent Labs  Lab 02/04/21 1553 02/04/21 1735 02/05/21 0500 02/05/21 0924 02/06/21 0409  NA 141 142 147*  --  143  K 4.4 3.8 3.4*  --  4.5  CL 106  --  124*  --  114*  CO2 22  --  18*  --  23  GLUCOSE 100*  --  58*  --  103*  BUN 48*  --  26*  --  19  CREATININE 6.37*  --  2.85*  --  1.57*  CALCIUM 8.5*  --  5.8*  --  7.9*  MG  --   --   --  2.2 1.9   Liver Function Tests: Recent Labs  Lab 02/04/21 1553  AST 41  ALT 15  ALKPHOS 129*  BILITOT  1.0  PROT 7.0  ALBUMIN 3.8   No results for input(s): LIPASE, AMYLASE in the last 168 hours. Recent Labs  Lab 02/04/21 1553  AMMONIA 33   CBC: Recent Labs  Lab 02/04/21 1553 02/04/21 1735 02/05/21 0500 02/06/21 0409  WBC 12.7*  --  6.3 6.3  NEUTROABS 10.2*  --   --   --   HGB 12.5 11.6* 8.7* 10.7*  HCT 39.3 34.0* 28.6* 34.8*  MCV 97.3  --  101.1* 98.3  PLT 234  --  171 215   Cardiac Enzymes: Recent Labs  Lab 02/04/21 1553 02/05/21 0500  CKTOTAL 2,576* 1,768*   BNP: Invalid input(s): POCBNP CBG: Recent Labs  Lab 02/04/21 1558  GLUCAP 103*   D-Dimer No results for input(s): DDIMER in the last 72 hours. Hgb A1c No results for input(s): HGBA1C in the last 72 hours. Lipid Profile No results for input(s): CHOL, HDL, LDLCALC, TRIG, CHOLHDL, LDLDIRECT in the last 72 hours. Thyroid function studies No results for input(s): TSH, T4TOTAL, T3FREE, THYROIDAB in the last 72 hours.  Invalid input(s): FREET3 Anemia work up No results for input(s): VITAMINB12, FOLATE, FERRITIN, TIBC, IRON, RETICCTPCT in the last 72 hours. Urinalysis    Component Value Date/Time   COLORURINE AMBER (A) 02/04/2021 1553   APPEARANCEUR CLEAR 02/04/2021 1553   APPEARANCEUR Clear 01/01/2018 1200   LABSPEC 1.014 02/04/2021 1553   PHURINE 5.0 02/04/2021 1553   GLUCOSEU NEGATIVE 02/04/2021 1553   GLUCOSEU Color Interference (A) 12/30/2019 1334   HGBUR MODERATE (A) 02/04/2021 1553   BILIRUBINUR NEGATIVE 02/04/2021 1553   BILIRUBINUR negative 06/20/2020 1222   BILIRUBINUR Negative 01/01/2018 1200   KETONESUR NEGATIVE 02/04/2021 1553  PROTEINUR NEGATIVE 02/04/2021 1553   UROBILINOGEN 0.2 06/20/2020 1222   UROBILINOGEN Color Interference (A) 12/30/2019 1334   NITRITE POSITIVE (A) 02/04/2021 1553   LEUKOCYTESUR NEGATIVE 02/04/2021 1553   Sepsis Labs Invalid input(s): PROCALCITONIN,  WBC,  LACTICIDVEN Microbiology Recent Results (from the past 240 hour(s))  Resp Panel by RT-PCR (Flu A&B,  Covid) Nasopharyngeal Swab     Status: None   Collection Time: 02/04/21  4:05 PM   Specimen: Nasopharyngeal Swab; Nasopharyngeal(NP) swabs in vial transport medium  Result Value Ref Range Status   SARS Coronavirus 2 by RT PCR NEGATIVE NEGATIVE Final    Comment: (NOTE) SARS-CoV-2 target nucleic acids are NOT DETECTED.  The SARS-CoV-2 RNA is generally detectable in upper respiratory specimens during the acute phase of infection. The lowest concentration of SARS-CoV-2 viral copies this assay can detect is 138 copies/mL. A negative result does not preclude SARS-Cov-2 infection and should not be used as the sole basis for treatment or other patient management decisions. A negative result may occur with  improper specimen collection/handling, submission of specimen other than nasopharyngeal swab, presence of viral mutation(s) within the areas targeted by this assay, and inadequate number of viral copies(<138 copies/mL). A negative result must be combined with clinical observations, patient history, and epidemiological information. The expected result is Negative.  Fact Sheet for Patients:  EntrepreneurPulse.com.au  Fact Sheet for Healthcare Providers:  IncredibleEmployment.be  This test is no t yet approved or cleared by the [place] FDA and  has been authorized for detection and/or diagnosis of SARS-CoV-2 by FDA under an Emergency Use Authorization (EUA). This EUA will remain  in effect (meaning this test can be used) for the duration of the COVID-19 declaration under Section 564(b)(1) of the Act, 21 U.S.C.section 360bbb-3(b)(1), unless the authorization is terminated  or revoked sooner.       Influenza A by PCR NEGATIVE NEGATIVE Final   Influenza B by PCR NEGATIVE NEGATIVE Final    Comment: (NOTE) The Xpert Xpress SARS-CoV-2/FLU/RSV plus assay is intended as an aid in the diagnosis of influenza from Nasopharyngeal swab specimens and should  not be used as a sole basis for treatment. Nasal washings and aspirates are unacceptable for Xpert Xpress SARS-CoV-2/FLU/RSV testing.  Fact Sheet for Patients: EntrepreneurPulse.com.au  Fact Sheet for Healthcare Providers: IncredibleEmployment.be  This test is not yet approved or cleared by the [place] FDA and has been authorized for detection and/or diagnosis of SARS-CoV-2 by FDA under an Emergency Use Authorization (EUA). This EUA will remain in effect (meaning this test can be used) for the duration of the COVID-19 declaration under Section 564(b)(1) of the Act, 21 U.S.C. section 360bbb-3(b)(1), unless the authorization is terminated or revoked.  Performed at [facility], [place] [address] [place], [place] [address]      Patient was seen and examined on the day of discharge and was found to be in stable condition. Time coordinating discharge: 25 minutes including assessment and coordination of care, as well as examination of the patient.   SIGNED:  Dessa Phi, DO [facility] 02/06/2021, 9:56 AM

## 2021-02-06 NOTE — Telephone Encounter (Signed)
Patient is requesting a referral to a kidney specialist. Please advise

## 2021-02-07 ENCOUNTER — Other Ambulatory Visit: Payer: Self-pay | Admitting: Internal Medicine

## 2021-02-07 LAB — URINE CULTURE: Culture: NO GROWTH

## 2021-02-07 NOTE — Telephone Encounter (Signed)
Referral ordered

## 2021-02-08 ENCOUNTER — Telehealth: Payer: Self-pay

## 2021-02-08 NOTE — Telephone Encounter (Signed)
Transition Care Management Follow-up Telephone Call Date of discharge and from where: 02/06/2021 from [facility] How have you been since you were released from the hospital? Feeling okay; worried about my kidneys Any questions or concerns? No  Items Reviewed: Did the pt receive and understand the discharge instructions provided? Yes  Medications obtained and verified? Yes  Other? No  Any new allergies since your discharge? No  Dietary orders reviewed? No Do you have support at home? Yes , daughter  [place] and Equipment/Supplies: Were home health services ordered? no If so, what is the name of the agency? N/a  Has the agency set up a time to come to the patient's home? no Were any new equipment or medical supplies ordered?  No What is the name of the medical supply agency? N/a Were you able to get the supplies/equipment? not applicable Do you have any questions related to the use of the  equipment or supplies? No  Functional Questionnaire: (I = Independent and D = Dependent) ADLs: I   Bathing/Dressing- I  Meal Prep- I  Eating- I  Maintaining continence- I  Transferring/Ambulation- I  Managing Meds- I  Follow up appointments reviewed:  PCP Hospital f/u appt confirmed? Yes  Scheduled to see Billey Gosling, MD on 02/12/2021 @ 2:20 pm. [facility] f/u appt confirmed? No   Are transportation arrangements needed? No  If their condition worsens, is the pt aware to call PCP or go to the Emergency Dept.? Yes Was the patient provided with contact information for the PCP's office or ED? Yes Was to pt encouraged to call back with questions or concerns? Yes

## 2021-02-11 NOTE — Patient Instructions (Addendum)
Blood work was ordered.     Medications changes include :   continue the amlodipine -benazepril for now and stop the water pill    Please followup in 1 month     Chronic Kidney Disease, Adult Chronic kidney disease (CKD) occurs when the kidneys are slowly and permanently damaged over a long period of time. The kidneys are a pair of organs that do many important jobs in the body, including: Removing waste and extra fluid from the blood to make urine. Making hormones that maintain the amount of fluid in tissues and blood vessels. Maintaining the right amount of fluids and chemicals in the body. A small amount of kidney damage may not cause problems, but a large amount of damage may make it hard or impossible for the kidneys to work right. Steps must be taken to slow kidney damage or to stop it from getting worse. If steps are not taken, the kidneys may stop working permanently (end-stage renal disease, or ESRD). Most of the time, CKD does not go away, but it can often becontrolled. People who have CKD are usually able to live full lives. What are the causes? The most common causes of this condition are diabetes and high blood pressure (hypertension). Other causes include: Cardiovascular diseases. These affect the heart and blood vessels. Kidney diseases. These include: Glomerulonephritis, or inflammation of the tiny filters in the kidneys. Interstitial nephritis. This is swelling of the small tubes of the kidneys and of the surrounding structures. Polycystic kidney disease, in which clusters of fluid-filled sacs form within the kidneys. Renal vascular disease. This includes disorders that affect the arteries and veins of the kidneys. Diseases that affect the body's defense system (immune system). A problem with urine flow. This may be caused by: Kidney stones. Cancer. An enlarged prostate, in males. A kidney infection or urinary tract infection (UTI) that keeps coming  back. Vasculitis. This is swelling or inflammation of the blood vessels. What increases the risk? Your chances of having kidney disease increase with age. The following factors may make you more likely to develop this condition: A family history of kidney disease or kidney failure. Kidney failure means the kidneys can no longer work right. Certain genetic diseases. Taking medicines often that are damaging to the kidneys. Being around or being in contact with toxic substances. Obesity. A history of tobacco use. What are the signs or symptoms? Symptoms of this condition include: Feeling very tired (lethargic) and having less energy. Swelling, or edema, of the face, legs, ankles, or feet. Nausea or vomiting, or loss of appetite. Confusion or trouble concentrating. Muscle twitches and cramps, especially in the legs. Dry, itchy skin. A metallic taste in the mouth. Producing less urine, or producing more urine (especially at night). Shortness of breath. Trouble sleeping. CKD may also result in not having enough red blood cells or hemoglobin in the blood (anemia) or having weak bones (bone disease). Symptoms develop slowly and may not be obvious until the kidney damage becomessevere. It is possible to have kidney disease for years without having symptoms. How is this diagnosed? This condition may be diagnosed based on: Blood tests. Urine tests. Imaging tests, such as an ultrasound or a CT scan. A kidney biopsy. This involves removing a sample of kidney tissue to be looked at under a microscope. Results from these tests will help to determine how serious the CKD is. How is this treated? There is no cure for most cases of this condition, but treatment usually relieves symptoms  and prevents or slows the worsening of the disease. Treatment may include: Diet changes, which may require you to avoid alcohol and foods that are high in salt, potassium, phosphorous, and protein. Medicines. These  may: Lower blood pressure. Control blood sugar (glucose). Relieve anemia. Relieve swelling. Protect your bones. Improve the balance of salts and minerals in your blood (electrolytes). Dialysis, which is a type of treatment that removes toxic waste from the body. It may be needed if you have kidney failure. Managing any other conditions that are causing your CKD or making it worse. Follow these instructions at home: Medicines Take over-the-counter and prescription medicines only as told by your health care provider. The amount of some medicines that you take may need to be changed. Do not take any new medicines unless approved by your health care provider. Many medicines can make kidney damage worse. Do not take any vitamin and mineral supplements unless approved by your health care provider. Many nutritional supplements can make kidney damage worse. Lifestyle  Do not use any products that contain nicotine or tobacco, such as cigarettes, e-cigarettes, and chewing tobacco. If you need help quitting, ask your health care provider. If you drink alcohol: Limit how much you use to: 0-1 drink a day for women who are not pregnant. 0-2 drinks a day for men. Know how much alcohol is in your drink. In the [place] one drink equals one 12 oz bottle of beer (355 mL), one 5 oz glass of wine (148 mL), or one 1 oz glass of hard liquor (44 mL). Maintain a healthy weight. If you need help, ask your health care provider.  General instructions  Follow instructions from your health care provider about eating or drinking restrictions, including any prescribed diet. Track your blood pressure at home. Report changes in your blood pressure as told. If you are being treated for diabetes, track your blood glucose levels as told. Start or continue an exercise plan. Exercise at least 30 minutes a day, 5 days a week. Keep your immunizations up to date as told. Keep all follow-up visits. This is important.  Where to  find more information American Association of Kidney Patients: BombTimer.gl National Kidney Foundation: www.kidney.[place]: https://mathis.com/ Life Options: www.lifeoptions.org Kidney School: www.kidneyschool.org Contact a health care provider if: Your symptoms get worse. You develop new symptoms. Get help right away if: You develop symptoms of ESRD. These include: Headaches. Numbness in your hands or feet. Easy bruising. Frequent hiccups. Chest pain. Shortness of breath. Lack of menstrual periods, in women. You have a fever. You are producing less urine than usual. You have pain or bleeding when you urinate or when you have a bowel movement. These symptoms may represent a serious problem that is an emergency. Do not wait to see if the symptoms will go away. Get medical help right away. Call your local emergency services (911 in the [place] Do not drive yourself to the hospital. Summary Chronic kidney disease (CKD) occurs when the kidneys become damaged slowly over a long period of time. The most common causes of this condition are diabetes and high blood pressure (hypertension). There is no cure for most cases of CKD, but treatment usually relieves symptoms and prevents or slows the worsening of the disease. Treatment may include a combination of lifestyle changes, medicines, and dialysis. This information is not intended to replace advice given to you by your health care provider. Make sure you discuss any questions you have with your healthcare provider. Document Revised:  11/24/2019 Document Reviewed: 11/24/2019 Elsevier Patient Education  [place].

## 2021-02-11 NOTE — Progress Notes (Signed)
Subjective:    Patient ID: Jill Phillips, female    DOB: 1956/03/17, 65 y.o.   MRN: [id number]  HPI The patient is here for follow up from the hospital  Admitted 6/5 - 6/7 for AMS, overdose  Recommendations for Outpatient Follow-up:  Follow up with PCP in 1 week Follow up with Dr. [name] Follow up with pain management clinic Please obtain BMP in 1 week  Follow-up for final results of urine culture which was obtained in the hospital  She went to the ED for unintentional narcotic overdose and AMS.  She had increased hip pain her daughter gave her an additional fentanyl patch.  She was sleepy throughout the day and was not waking up.  EMS was called and found her barely arousable.  Given 8 mg narcan.  Ct head neg.  Elevated WBC.  Low BP.  Decreased GFR.  Her mentation improved slowly in in ED.  She was hypotensive and had AKI.  She was given IVF.  Given one dose of ceftriaxone.  Hypotension and AKI improved with fluids. She was back to her baseline at d/c.    She states her daughter had put a new patch on her and forgot to take the old patch off.  Her daughter did try giving her narcan in both nostrils and there was no improvement.  She states she did not overdose.  She does not recall anything after 6:30 pm.  She did eat regularly that day.  She thinks she was probably dehydrated.  She will drink soda but does not drink much water.   Acute toxic encephalopathy d/t unintentional drug OD: Ct head negative for acute change S/p narcan -AMS improved and resolved  Hypotension: Secondary to hypovolemia, dehydration, narcotics Had luekocytosis - deemed secondary to hemoconcentration - sepsis ruled out Did receive one dose of ceftriaxone in ED CXR neg Urine cx neg Improved  AKI: Secondary to hypovolemnia, dehydration, narcotics Improved with IVF Renal US normal Home ACE-I and maxide held Needs repeat BMP  Hypothyroidism: Continued on synthroid  Hyperlipidemia: Continued on  lipitor  Chronic pain : On fentanyl patch, norco Medication management per pain management Hip pain  - sees Dr [name] - f/u as outpatient  Mood disorder: Continued on clonazepam, seroquel, effexor  Urine culture from hospital negative for infection.    Since coming home she has been taking her maxzide and lotrel.  She just found out today she was given d/c paperwork.   She is trying to drink more water and limit her soda.    She is very frustrated and concerned she does not remember what happened.  She is very concerned about her kidneys.   Medications and allergies reviewed with patient and updated if appropriate.  Patient Active Problem List   Diagnosis Date Noted   Acute encephalopathy 02/05/2021   Hypotension 02/05/2021   AKI (acute kidney injury) ([place]) 02/04/2021   Overdose 02/04/2021   Acute gout 06/20/2020   Interstitial cystitis 01/21/2020   Status post shoulder surgery 09/09/2019   Nausea 08/19/2019   Anxiety 05/04/2019   Insomnia 05/04/2019   Syncope 12/08/2018   Disorder of rotator cuff syndrome of left shoulder and allied disorder 06/22/2018   Bilateral leg edema 06/04/2018   Type 2 diabetes mellitus without complication, without long-term current use of insulin ([place]) 12/02/2017   Sacral pain 09/17/2017   COPD (chronic obstructive pulmonary disease) ([place]) 04/21/2017   Lower back pain 03/03/2017   CKD (chronic kidney disease) stage 3, GFR 30-59  ml/min ([place]) 08/07/2016   GERD (gastroesophageal reflux disease) 08/07/2016   Chronic respiratory failure ([place]) 07/29/2016   Arthritis of carpometacarpal (CMC) joint of left thumb 07/09/2016   Pneumothorax on left 01/31/2016   Chronic, continuous use of opioids 09/06/2015   Degenerative disc disease, lumbar 08/01/2015   Spondylolisthesis of lumbar region 08/01/2015   Urinary, incontinence, stress female 05/06/2014   Constipation 05/05/2014   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/14/2013   HTN (hypertension) 05/19/2013   HLD  (hyperlipidemia) 05/19/2013   Depression 05/19/2013   Hypothyroidism 05/19/2013   Tobacco abuse    Osteoarthritis of both knees 11/18/2011   RLS (restless legs syndrome) 11/18/2011    Current Outpatient Medications on File Prior to Visit  Medication Sig Dispense Refill   acetaminophen (TYLENOL) 500 MG tablet Take 1,000 mg by mouth every 6 (six) hours as needed (for headaches.).     albuterol (PROAIR HFA) 108 (90 Base) MCG/ACT inhaler INHALE 2 PUFFS INTO THE LUNGS EVERY 6 HOURS AS NEEDED FOR WHEEZING OR SHORTNESS OF BREATH. (Patient taking differently: Inhale 2 puffs into the lungs every 6 (six) hours as needed for wheezing or shortness of breath.) 8.5 g 0   atorvastatin (LIPITOR) 20 MG tablet TAKE 1 TABLET BY MOUTH DAILY. (Patient taking differently: Take 20 mg by mouth daily.) 90 tablet 1   cimetidine (TAGAMET) 400 MG tablet Take 400 mg by mouth in the morning.     clonazePAM (KLONOPIN) 1 MG tablet TAKE 1 TABLET BY MOUTH 2 TIMES DAILY AS NEEDED FOR ANXIETY. (Patient taking differently: Take 1 mg by mouth 2 (two) times daily as needed for anxiety.) 60 tablet 0   docusate sodium (COLACE) 100 MG capsule Take 300 mg by mouth at bedtime.     fentaNYL 37.5 MCG/HR PT72 Apply 1 patch topically every 3 (three) days.     gabapentin (NEURONTIN) 100 MG capsule Take 1 capsule (100 mg total) by mouth 2 (two) times daily. 60 capsule 2   HYDROcodone-acetaminophen (NORCO) 10-325 MG tablet Take 1 tablet by mouth every 6 (six) hours as needed for severe pain (Chronic back pain). (Patient taking differently: Take 1-4 tablets by mouth every 6 (six) hours as needed for severe pain (Chronic back pain).) 84 tablet 0   levothyroxine (SYNTHROID) 88 MCG tablet TAKE 1 TABLET BY MOUTH DAILY. (Patient taking differently: Take 88 mcg by mouth daily before breakfast.) 90 tablet 1   NARCAN 4 MG/0.1ML LIQD nasal spray kit Place 1 spray into the nose as needed (accidental overdose). 1 each 0   QUEtiapine (SEROQUEL) 100 MG tablet  TAKE 1 AND 1/2 TABLETS BY MOUTH AT BEDTIME. 135 tablet 1   rOPINIRole (REQUIP) 0.5 MG tablet Take 0.5 mg by mouth 4 (four) times daily as needed (rls).     Sennosides (SENNA LAX PO) Take 2 tablets by mouth at bedtime.     solifenacin (VESICARE) 10 MG tablet Take 10 mg by mouth daily.     tiZANidine (ZANAFLEX) 2 MG tablet Take 1 tablet (2 mg total) by mouth every 8 (eight) hours as needed for muscle spasms. 90 tablet 2   valACYclovir (VALTREX) 500 MG tablet TAKE 1 TABLET (500 MG TOTAL) BY MOUTH DAILY. 90 tablet 1   No current facility-administered medications on file prior to visit.    Past Medical History:  Diagnosis Date   Active smoker    Anxiety    Calcifying tendinitis of shoulder    Chronic back pain    Chronic pain syndrome    COPD (chronic obstructive  pulmonary disease) ([place])    Dysthymic disorder    Emphysema lung ([place])    GERD (gastroesophageal reflux disease)    Headache    "weekly maybe" (01/31/2016)   Heart murmur    for years, nothing to be concerned about   Herpes genitalia    History of blood transfusion 1980   related to "back surgery"   Hyperlipidemia    Hypertension    Hypothyroidism    Lumbago    Osteoarthrosis, unspecified whether generalized or localized, lower leg    Pain in joint, upper arm    Pneumothorax, left 01/31/2016   S/P Left posterior subcostal pain injection on 01/30/2016   PONV (postoperative nausea and vomiting)    gets nauseous  with longer surgery. Difficuty voiding after surgery   Postlaminectomy syndrome, thoracic region    Primary localized osteoarthrosis, lower leg    Restless leg syndrome    Sleep apnea    s/p surgery- last sleep study 2011- doesnt use oxygen or machine at night as instructed,.   12/2014- Dr Halford Chessman  reports it is negative.   Thyroid disease     Past Surgical History:  Procedure Laterality Date   APPENDECTOMY     BACK SURGERY     18 back surgeries (2 thoracic & 16 lumbar) (01/31/2016)   DILATION AND CURETTAGE OF  UTERUS     HAMMER TOE SURGERY     IR RADIOLOGIST EVAL & MGMT  07/21/2017   JOINT REPLACEMENT     KNEE ARTHROSCOPY Right    LAPAROSCOPIC CHOLECYSTECTOMY     LUMBAR FUSION N/A 08/01/2015   Procedure: Right sided L1-2 and L2-3 transforaminal lumbar interbody fusion with cages, Extension of posterior fusion T12 to L3, Replaced pedicle screws bilaterally L1-L2 , Replaced left sided pedicle screws L-3. Instrumentation T12 to L3 using local bone graft, Vivigen allograft and cancellous chips;  Surgeon: [name], MD;  Location: [facility];  Service: Orthopedics;  Laterality: N/A;   LUMBAR LAMINECTOMY/DECOMPRESSION MICRODISCECTOMY N/A 01/28/2014   Procedure: Minimally Invasive Right  L1-2 Microdiscectomy;  Surgeon: [name], MD;  Location: [facility];  Service: Orthopedics;  Laterality: N/A;   REVERSE SHOULDER ARTHROPLASTY Left 09/09/2019   Procedure: LEFT REVERSE SHOULDER REPLACEMENT;  Surgeon: Meredith Pel, MD;  Location: [facility];  Service: Orthopedics;  Laterality: Left;   TOTAL HIP ARTHROPLASTY Right    TOTAL KNEE ARTHROPLASTY  05/29/2012   Procedure: TOTAL KNEE ARTHROPLASTY;  Surgeon: Mcarthur Rossetti, MD;  Location: [facility];  Service: Orthopedics;  Laterality: Right;  Right Total Knee Arthroplasty   TUBAL LIGATION     UVULOPALATOPHARYNGOPLASTY     VAGINAL HYSTERECTOMY      Social History   Socioeconomic History   Marital status: Divorced    Spouse name: n/a   Number of children: 2   Years of education: 12+   Highest education level: Not on file  Occupational History   Occupation: disability    Comment: back surgeries  Tobacco Use   Smoking status: Every Day    Packs/day: 1.25    Years: 45.00    Pack years: 56.25    Types: Cigarettes   Smokeless tobacco: Never   Tobacco comments:    form given 12/25/16  Vaping Use   Vaping Use: Never used  Substance and Sexual Activity   Alcohol use: No    Alcohol/week: 0.0 standard drinks   Drug use: No    Types: Marijuana    Comment:  01/31/2016 "none since ~ 1980"  Sexual activity: Never    Partners: Male  Other Topics Concern   Not on file  Social History Narrative   Lives alone.  One daughter is local, but is getting ready to move to [place], where her children live with their father.  The other daughter lives near [place], [place].   Social Determinants of Health   Financial Resource Strain: Low Risk    Difficulty of Paying Living Expenses: Not very hard  Food Insecurity: Not on file  Transportation Needs: Not on file  Physical Activity: Not on file  Stress: Not on file  Social Connections: Not on file    Family History  Problem Relation Age of Onset   Kidney disease Mother    Heart disease Father    Anuerysm Brother 1       brain   Heart disease Brother    Heart disease Sister 70       s/p CABG   Hypertension Sister    Colon cancer Neg Hx     Review of Systems  Constitutional:  Negative for fever.  Respiratory:  Positive for cough (bring phlegm up every morning) and shortness of breath. Negative for wheezing.   Cardiovascular:  Positive for leg swelling. Negative for chest pain and palpitations.  Gastrointestinal:  Positive for constipation. Negative for abdominal pain, blood in stool and nausea.  Genitourinary:  Negative for dysuria.  Musculoskeletal:  Positive for arthralgias and back pain.  Neurological:  Positive for headaches. Negative for light-headedness.      Objective:   Vitals:   02/12/21 1418  BP: 140/80  Pulse: 93  Temp: 98.4 F (36.9 C)  SpO2: 97%   BP Readings from Last 3 Encounters:  02/12/21 140/80  02/06/21 (!) 115/50  01/11/21 122/74   Wt Readings from Last 3 Encounters:  02/12/21 156 lb 6.4 oz (70.9 kg)  02/05/21 145 lb (65.8 kg)  01/11/21 151 lb 6.4 oz (68.7 kg)   Body mass index is 26.85 kg/m.   Physical Exam    Constitutional: Appears well-developed and well-nourished. No distress.  HENT:  Head: Normocephalic and atraumatic.  Neck: Neck supple. No  tracheal deviation present. No thyromegaly present.  No cervical lymphadenopathy Cardiovascular: Normal rate, regular rhythm and normal heart sounds.  No murmur heard. No carotid bruit .  No edema Pulmonary/Chest: Effort normal and breath sounds normal. No respiratory distress. No has no wheezes. No rales.  Abdomen: soft, slightly distended, non tender Skin: Skin is warm and dry. Not diaphoretic.  Psychiatric: Normal mood and affect. Behavior is normal.   US RENAL CLINICAL DATA:  Acute renal injury.  EXAM: RENAL / URINARY TRACT ULTRASOUND COMPLETE  COMPARISON:  August 20, 2010  FINDINGS: Right Kidney:  Renal measurements: 9.2 cm x 4.9 cm x 4.8 cm = volume: 113.6 mL. Echogenicity within normal limits. No mass or hydronephrosis visualized.  Left Kidney:  Renal measurements: 9.0 cm x 5.4 cm x 5.0 cm = volume: 126.4 mL. Echogenicity within normal limits. No mass or hydronephrosis visualized.  Bladder:  A Foley catheter is present within the urinary bladder (as per the ultrasound technologist)  Other:  The study is limited secondary to limited ability of the patient to cooperate during the exam.  IMPRESSION: Normal renal ultrasound.  Electronically Signed   By: Virgina Norfolk M.D.   On: 02/04/2021 20:21 CT Head Wo Contrast CLINICAL DATA:  Possible overdose.  EXAM: CT HEAD WITHOUT CONTRAST  TECHNIQUE: Contiguous axial images were obtained from the base of the skull  through the vertex without intravenous contrast.  COMPARISON:  October 15, 2014  FINDINGS: Brain: There is mild cerebral atrophy with widening of the extra-axial spaces. This is more prominent throughout the left hemisphere and is stable in appearance when compared to the prior study. There are areas of very mildly decreased attenuation within the white matter tracts of the supratentorial brain, consistent with microvascular disease changes.  Vascular: No hyperdense vessel or unexpected  calcification.  Skull: Normal. Negative for fracture or focal lesion.  Sinuses/Orbits: No acute finding.  Other: None.  IMPRESSION: Stable exam without an acute intracranial abnormality.  Electronically Signed   By: Virgina Norfolk M.D.   On: 02/04/2021 16:43 DG Chest Port 1 View CLINICAL DATA:  Weakness, drug overdose  EXAM: PORTABLE CHEST 1 VIEW  COMPARISON:  Portable exam 1610 hours compared to 04/21/2017  FINDINGS: Rotated to the RIGHT.  Normal heart size mediastinal contours.  Bronchitic changes and chronic accentuation of perihilar markings, stable.  No acute infiltrate, pleural effusion, or pneumothorax.  Prior thoracolumbar spinal fixation and LEFT shoulder reverse arthroplasty.  IMPRESSION: Chronic bronchitic changes.  No acute abnormalities.  Electronically Signed   By: Lavonia Dana M.D.   On: 02/04/2021 16:16 MR Hip Left w/o contrast CLINICAL DATA:  Left hip pain over the last 3 months  EXAM: MR OF THE LEFT HIP WITHOUT CONTRAST  TECHNIQUE: Multiplanar, multisequence MR imaging was performed. No intravenous contrast was administered.  COMPARISON:  Radiographs 01/11/2021 and MRI from 05/22/2017  FINDINGS: Bones: Postoperative findings in the lumbar spine with multilevel lower lumbar fusion and posterior decompression. Metal artifact from right total hip prosthesis noted. Likely graft harvest site from the right iliac bone.  Mild degenerative findings along the right SI joint on image 4 series 3. Mild spurring of the left acetabulum.  Articular cartilage and labrum  Articular cartilage:  No chondral defect identified.  Labrum: Borderline prominence of the anterior superior labral recess on image 9 series 6, but given that this linear signal is only visible on a single image it probably represents recess rather than a tear along the recess.  Joint or bursal effusion  Joint effusion:  Absent  Bursae: No regional bursitis.  Muscles  and tendons  Muscles and tendons: 2.7 by 1.8 by 3.2 cm region of faintly abnormally accentuated T2 signal within the gluteus minimus muscle on image 24 series 5, without associated T1 signal abnormality. This region of apparent edema is somewhat focal and nonspecific but could reflect a muscle strain.  Other findings  Miscellaneous:   No supplemental non-categorized findings.  IMPRESSION: 1. Localized edema in a portion of the gluteus minimus muscle, muscle strain not excluded. 2. Multilevel lower lumbar fusion with posterior decompression. 3. Borderline prominence of the recess of the left anterior superior labrum but I do not see a definite labral tear. 4. Right total hip prosthesis, with likely graft harvest site from the right iliac bone. 5. Mild degenerative findings along the inferior aspect of the right SI joint.  Electronically Signed   By: Van Clines M.D.   On: 02/04/2021 09:20  Lab Results  Component Value Date   WBC 6.3 02/06/2021   HGB 10.7 (L) 02/06/2021   HCT 34.8 (L) 02/06/2021   PLT 215 02/06/2021   GLUCOSE 80 02/12/2021   CHOL 206 (H) 12/29/2020   TRIG 257.0 (H) 12/29/2020   HDL 42.50 12/29/2020   LDLDIRECT 129.0 12/29/2020   LDLCALC 86 11/03/2018   ALT 15 02/04/2021   AST 41 02/04/2021  NA 144 02/12/2021   K 4.4 02/12/2021   CL 104 02/12/2021   CREATININE 1.70 (H) 02/12/2021   BUN 22 02/12/2021   CO2 30 02/12/2021   TSH 1.47 12/29/2020   INR 0.87 05/26/2012   HGBA1C 6.7 (H) 12/29/2020      Assessment & Plan:    See Problem List for Assessment and Plan of chronic medical problems.    This visit occurred during the SARS-CoV-2 public health emergency.  Safety protocols were in place, including screening questions prior to the visit, additional usage of staff PPE, and extensive cleaning of exam room while observing appropriate contact time as indicated for disinfecting solutions.

## 2021-02-12 ENCOUNTER — Other Ambulatory Visit: Payer: Self-pay

## 2021-02-12 ENCOUNTER — Encounter: Payer: Self-pay | Admitting: Internal Medicine

## 2021-02-12 ENCOUNTER — Ambulatory Visit (INDEPENDENT_AMBULATORY_CARE_PROVIDER_SITE_OTHER): Payer: Medicare Other | Admitting: Internal Medicine

## 2021-02-12 VITALS — BP 140/80 | HR 93 | Temp 98.4°F | Ht 64.0 in | Wt 156.4 lb

## 2021-02-12 DIAGNOSIS — K219 Gastro-esophageal reflux disease without esophagitis: Secondary | ICD-10-CM

## 2021-02-12 DIAGNOSIS — G9340 Encephalopathy, unspecified: Secondary | ICD-10-CM | POA: Diagnosis not present

## 2021-02-12 DIAGNOSIS — E038 Other specified hypothyroidism: Secondary | ICD-10-CM

## 2021-02-12 DIAGNOSIS — T50901A Poisoning by unspecified drugs, medicaments and biological substances, accidental (unintentional), initial encounter: Secondary | ICD-10-CM | POA: Diagnosis not present

## 2021-02-12 DIAGNOSIS — E119 Type 2 diabetes mellitus without complications: Secondary | ICD-10-CM | POA: Diagnosis not present

## 2021-02-12 DIAGNOSIS — E7849 Other hyperlipidemia: Secondary | ICD-10-CM

## 2021-02-12 DIAGNOSIS — I10 Essential (primary) hypertension: Secondary | ICD-10-CM

## 2021-02-12 DIAGNOSIS — N179 Acute kidney failure, unspecified: Secondary | ICD-10-CM

## 2021-02-12 DIAGNOSIS — F3289 Other specified depressive episodes: Secondary | ICD-10-CM

## 2021-02-12 DIAGNOSIS — F419 Anxiety disorder, unspecified: Secondary | ICD-10-CM

## 2021-02-12 DIAGNOSIS — N1832 Chronic kidney disease, stage 3b: Secondary | ICD-10-CM | POA: Diagnosis not present

## 2021-02-12 LAB — BASIC METABOLIC PANEL
BUN: 22 mg/dL (ref 6–23)
CO2: 30 mEq/L (ref 19–32)
Calcium: 9.3 mg/dL (ref 8.4–10.5)
Chloride: 104 mEq/L (ref 96–112)
Creatinine, Ser: 1.7 mg/dL — ABNORMAL HIGH (ref 0.40–1.20)
GFR: 31.16 mL/min — ABNORMAL LOW (ref >60.00)
Glucose, Bld: 80 mg/dL (ref 70–99)
Potassium: 4.4 mEq/L (ref 3.5–5.1)
Sodium: 144 mEq/L (ref 135–145)

## 2021-02-12 MED ORDER — VENLAFAXINE HCL ER 150 MG PO CP24: 300.0000 mg | ORAL_CAPSULE | Freq: Every day | ORAL | Status: DC

## 2021-02-12 MED ORDER — OMEPRAZOLE 40 MG PO CPDR: 40.0000 mg | DELAYED_RELEASE_CAPSULE | Freq: Two times a day (BID) | ORAL | 1 refills | Status: DC

## 2021-02-12 MED ORDER — AMLODIPINE BESY-BENAZEPRIL HCL 10-40 MG PO CAPS: 1.0000 | ORAL_CAPSULE | Freq: Every day | ORAL | Status: DC

## 2021-02-12 NOTE — Assessment & Plan Note (Signed)
Chronic Continue atorvastatin 20 mg daily 

## 2021-02-12 NOTE — Assessment & Plan Note (Signed)
Chronic BP not ideally controlled today Has been taking lotrel and maxzide despite them being discontinued in the hospital Bmp today - this will help determine what meds she should take  For now continue lotrel 10-40 mg daily and stop maxzide Depending on gfr will adjust lotrel or add other medication

## 2021-02-12 NOTE — Assessment & Plan Note (Signed)
Acute on CKD Likely related to dehydration and hypotension from keep old fentanyl patch on and having new fentanyl patch on, in addition to all of her other medications Improved with IVF, holding BP meds She did restart her BP meds when she got home and ideally should not have  BMP today Hold maxzide Continue lotrel 10-40 mg daily for now so BP is not too high - may need to adjust medications after BMP returns

## 2021-02-12 NOTE — Assessment & Plan Note (Signed)
Chronic Diet controlled Discussed importance of keeping sugars controlled to prevent worsening of her kidney function

## 2021-02-12 NOTE — Assessment & Plan Note (Signed)
Resolved with IVF, discontinuation of BP meds

## 2021-02-12 NOTE — Assessment & Plan Note (Signed)
Chronic Continue effexor 300 mg daily Continue clonazepam 1 mg bid -- ideally I would like to decrease this at some point

## 2021-02-12 NOTE — Assessment & Plan Note (Signed)
Chronic Continue levothyroxine 88 mcg daily 

## 2021-02-12 NOTE — Assessment & Plan Note (Signed)
Acute She states she did not overdose Discussed that by not taking off her old patch and having the new patch on - this did cause too much fentanyl in her system and caused low BP and an overdose

## 2021-02-12 NOTE — Assessment & Plan Note (Signed)
Chronic Improved initially after stopping diclofenac and hctz over one year ago, but has decreased since then Not drinking enough water, on many medications Referred to [facility] already - just after hospitalization BMP today - will adjust meds if needed Stressed increase water intake, avoid soda, need to keep BP / DM well controlled

## 2021-02-12 NOTE — Assessment & Plan Note (Signed)
Chronic Controlled, stable Continue effexor 300 mg daily

## 2021-02-12 NOTE — Assessment & Plan Note (Signed)
Chronic Not ideally controlled May need to see GI Continue omeprazole 40 mg bid- this is more effective than protonix 40 mg BID Continue cimetidine 400 mg daily

## 2021-02-14 ENCOUNTER — Other Ambulatory Visit: Payer: Self-pay | Admitting: Internal Medicine

## 2021-02-16 ENCOUNTER — Encounter: Payer: Self-pay | Admitting: Internal Medicine

## 2021-02-19 ENCOUNTER — Telehealth: Payer: Self-pay | Admitting: Pharmacist

## 2021-02-19 DIAGNOSIS — N3010 Interstitial cystitis (chronic) without hematuria: Secondary | ICD-10-CM | POA: Diagnosis not present

## 2021-02-19 DIAGNOSIS — M545 Low back pain, unspecified: Secondary | ICD-10-CM | POA: Diagnosis not present

## 2021-02-19 DIAGNOSIS — N184 Chronic kidney disease, stage 4 (severe): Secondary | ICD-10-CM | POA: Diagnosis not present

## 2021-02-19 DIAGNOSIS — Z79899 Other long term (current) drug therapy: Secondary | ICD-10-CM | POA: Diagnosis not present

## 2021-02-19 NOTE — Progress Notes (Signed)
Chronic Care Management Pharmacy Assistant   Name: Jill Phillips  MRN: [id number] DOB: Sep 22, 1955   Reason for Encounter: Disease State   Conditions to be addressed/monitored: HTN   Recent office visits:  12/29/20 Dr. Quay Burow, medication changes were tizanidine 4 mg 02/12/21 Dr. Quay Burow, medication changes were ordered amlodipine-benazepril 10-40 mg  Recent consult visits:  01/11/21 Dr. Serafina Mitchell, Orthopedics, no medication changes  Hospital visits:  Medication Reconciliation was completed by comparing discharge summary, patient's EMR and Pharmacy list, and upon discussion with patient.  Admitted to the hospital on 02/04/21 due to acute encephalopathy. Discharge date was 02/06/21. Discharged from [facility]?Medications Started at [facility] Discharge:?? -started none due to none  Medication Changes at Hospital Discharge: -Changed none  Medications Discontinued at Hospital Discharge: -Stopped amlodipine-benazepril and triamterene-hctz  Medications that remain the same after Hospital Discharge:??  -All other medications will remain the same.    Medications: Outpatient Encounter Medications as of 02/19/2021  Medication Sig Note   acetaminophen (TYLENOL) 500 MG tablet Take 1,000 mg by mouth every 6 (six) hours as needed (for headaches.).    albuterol (PROAIR HFA) 108 (90 Base) MCG/ACT inhaler INHALE 2 PUFFS INTO THE LUNGS EVERY 6 HOURS AS NEEDED FOR WHEEZING OR SHORTNESS OF BREATH. (Patient taking differently: Inhale 2 puffs into the lungs every 6 (six) hours as needed for wheezing or shortness of breath.)    amLODipine-benazepril (LOTREL) 10-40 MG capsule Take 1 capsule by mouth daily.    atorvastatin (LIPITOR) 20 MG tablet TAKE 1 TABLET BY MOUTH DAILY. (Patient taking differently: Take 20 mg by mouth daily.)    cimetidine (TAGAMET) 400 MG tablet Take 400 mg by mouth in the morning. 02/05/2021: Pt says this was give to her for rectal pain, but it is not helping.    clonazePAM (KLONOPIN) 1 MG tablet TAKE 1 TABLET BY MOUTH 2 TIMES DAILY AS NEEDED FOR ANXIETY. (Patient taking differently: Take 1 mg by mouth 2 (two) times daily as needed for anxiety.)    docusate sodium (COLACE) 100 MG capsule Take 300 mg by mouth at bedtime.    fentaNYL 37.5 MCG/HR PT72 Apply 1 patch topically every 3 (three) days.    gabapentin (NEURONTIN) 100 MG capsule Take 1 capsule (100 mg total) by mouth 2 (two) times daily.    HYDROcodone-acetaminophen (NORCO) 10-325 MG tablet Take 1 tablet by mouth every 6 (six) hours as needed for severe pain (Chronic back pain). (Patient taking differently: Take 1-4 tablets by mouth every 6 (six) hours as needed for severe pain (Chronic back pain).)    levothyroxine (SYNTHROID) 88 MCG tablet TAKE 1 TABLET BY MOUTH DAILY.    NARCAN 4 MG/0.1ML LIQD nasal spray kit Place 1 spray into the nose as needed (accidental overdose).    omeprazole (PRILOSEC) 40 MG capsule Take 1 capsule (40 mg total) by mouth 2 (two) times daily.    QUEtiapine (SEROQUEL) 100 MG tablet TAKE 1 AND 1/2 TABLETS BY MOUTH AT BEDTIME.    rOPINIRole (REQUIP) 0.5 MG tablet Take 0.5 mg by mouth 4 (four) times daily as needed (rls).    Sennosides (SENNA LAX PO) Take 2 tablets by mouth at bedtime.    solifenacin (VESICARE) 10 MG tablet Take 10 mg by mouth daily.    tiZANidine (ZANAFLEX) 2 MG tablet Take 1 tablet (2 mg total) by mouth every 8 (eight) hours as needed for muscle spasms.    valACYclovir (VALTREX) 500 MG tablet TAKE 1 TABLET (500  MG TOTAL) BY MOUTH DAILY.    venlafaxine XR (EFFEXOR-XR) 150 MG 24 hr capsule Take 2 capsules (300 mg total) by mouth daily with breakfast.    No facility-administered encounter medications on file as of 02/19/2021.    Pharmacist Review  Reviewed chart prior to disease state call. Spoke with patient regarding BP  Recent Office Vitals: BP Readings from Last 3 Encounters:  02/12/21 140/80  02/06/21 (!) 115/50  01/11/21 122/74   Pulse Readings from  Last 3 Encounters:  02/12/21 93  02/06/21 83  01/11/21 95    Wt Readings from Last 3 Encounters:  02/12/21 156 lb 6.4 oz (70.9 kg)  02/05/21 145 lb (65.8 kg)  01/11/21 151 lb 6.4 oz (68.7 kg)     Kidney Function Lab Results  Component Value Date/Time   CREATININE 1.70 (H) 02/12/2021 03:49 PM   CREATININE 1.57 (H) 02/06/2021 04:09 AM   CREATININE 1.13 (H) 02/15/2015 03:06 PM   CREATININE 1.00 05/06/2013 04:29 PM   GFR 31.16 (L) 02/12/2021 03:49 PM   GFRNONAA 36 (L) 02/06/2021 04:09 AM   GFRNONAA 53 (L) 02/15/2015 03:06 PM   GFRAA 59 (L) 09/07/2019 01:33 PM   GFRAA 61 02/15/2015 03:06 PM    BMP Latest Ref Rng & Units 02/12/2021 02/06/2021 02/05/2021  Glucose 70 - 99 mg/dL 80 103(H) 58(L)  BUN 6 - 23 mg/dL 22 19 26(H)  Creatinine 0.40 - 1.20 mg/dL 1.70(H) 1.57(H) 2.85(H)  BUN/Creat Ratio 11 - 26 - - -  Sodium 135 - 145 mEq/L 144 143 147(H)  Potassium 3.5 - 5.1 mEq/L 4.4 4.5 3.4(L)  Chloride 96 - 112 mEq/L 104 114(H) 124(H)  CO2 19 - 32 mEq/L 30 23 18(L)  Calcium 8.4 - 10.5 mg/dL 9.3 7.9(L) 5.8(LL)    Current antihypertensive regimen:  Amlodipine-benazepril 10-40 mg 1 cap daily  How often are you checking your Blood Pressure? Patient states that sometimes once or twice a week  Current home BP readings: 02/12/21 - 147/84, 02/13/21 - 128/95, 02/15/21- 133/92, 02/16/21- 105/76  What recent interventions/DTPs have been made by any provider to improve Blood Pressure control since last CPP Visit: For now continue lotrel 10-40 mg daily and stop maxzide Depending on gfr will adjust lotrel or add other medication, per Dr. Quay Burow  Any recent hospitalizations or ED visits since last visit with CPP? Yes, patient was seen [facility] on 02/04/21  What diet changes have been made to improve Blood Pressure Control?  Patient states that sometimes she does not eat because her care giver which is her daughter does not cook, so her diet is not very good What exercise is being done to improve your  Blood Pressure Control?  Patient states that she can't exercise because of back pain  Adherence Review: Is the patient currently on ACE/ARB medication? No Does the patient have >5 day gap between last estimated fill dates? No     Star Rating Drugs: Atorvastatin 02/02/21 90 ds [place] Pharmacist Assistant [phone]   Time spent:48

## 2021-02-20 NOTE — Telephone Encounter (Signed)
Contacted patient to discuss kidney concerns in light of recent Mychart message to PCP.  Discussed causes of recent AKI, and initial improvement in hospital while patient was getting IV fluids. Emphasized importance of staying hydrated. Pt says she has been drinking Gatorade. Also verified that patient is no longer taking triamterene-HCTZ, only taking amlodipine-benazepril. She reports BP has been < 140/90.   Patient is concerned about dialysis. Discussed it is good news that kidneys initially improved with fluids, we are not yet at a point where we need to consider dialysis but it will be important to establish care with nephrology. Pt reports she called nephrology office and they told her to make sure a referral was sent. Per chart referral was sent through [facility] on 02/16/21.

## 2021-02-21 DIAGNOSIS — Z79899 Other long term (current) drug therapy: Secondary | ICD-10-CM | POA: Diagnosis not present

## 2021-02-27 ENCOUNTER — Other Ambulatory Visit: Payer: Self-pay | Admitting: Internal Medicine

## 2021-02-28 ENCOUNTER — Telehealth: Payer: Self-pay | Admitting: Internal Medicine

## 2021-02-28 NOTE — Telephone Encounter (Signed)
   Patient called and said that he BP has been running 170/97 and 154/92. She said that she notices when it get high she get a headache. She also said that she has not received a call from [facility]. She said that she has called them twice. She is requesting a referral for another office. Please advise

## 2021-03-01 NOTE — Telephone Encounter (Signed)
Patient called again

## 2021-03-02 ENCOUNTER — Other Ambulatory Visit: Payer: Self-pay | Admitting: Specialist

## 2021-03-02 NOTE — Telephone Encounter (Signed)
   Patient calling for advice on blood pressure 173/98 on 6/30 and requesting antibiotic for cat bite. She was bitten by her cat 6/30, on her arm.  Please call

## 2021-03-02 NOTE — Telephone Encounter (Signed)
If she needs an antibiotic she needs to be evaluated in person.    Has her BP been elevated for a while or was it just elevated this one time?

## 2021-03-03 ENCOUNTER — Encounter: Payer: Self-pay | Admitting: Internal Medicine

## 2021-03-05 MED ORDER — NEBIVOLOL HCL 5 MG PO TABS: 5.0000 mg | ORAL_TABLET | Freq: Every day | ORAL | 5 refills | Status: DC

## 2021-03-07 ENCOUNTER — Encounter: Payer: Self-pay | Admitting: Radiology

## 2021-03-07 ENCOUNTER — Telehealth: Payer: Self-pay | Admitting: Specialist

## 2021-03-07 NOTE — Telephone Encounter (Signed)
Pt calling requesting a new letter from [facility] or his nurse still stating her daughter is her caretaker, states she requests one every 6 months. The best phone number is [phone] and pt said mailing the letter is okay.

## 2021-03-07 NOTE — Telephone Encounter (Signed)
I wrote the note and mailed it to the patient per her request

## 2021-03-08 ENCOUNTER — Encounter: Payer: Self-pay | Admitting: Internal Medicine

## 2021-03-08 NOTE — Patient Instructions (Addendum)
Your goal BP is less 130/80.   Blood work was ordered.     Medications changes include :   none    Please followup in 6 months    Chronic Kidney Disease, Adult Chronic kidney disease is when lasting damage happens to the kidneys slowly over a long time. The kidneys help to: Make pee (urine). Make hormones. Keep the right amount of fluids and chemicals in the body. Most often, this disease does not go away. You must take steps to help keep the kidney damage from getting worse. If steps are not taken, the kidneys mightstop working forever. What are the causes? Diabetes. High blood pressure. Diseases that affect the heart and blood vessels. Other kidney diseases. Diseases of the body's disease-fighting system. A problem with the flow of pee. Infections of the organs that make pee, store it, and take it out of the body. Swelling or irritation of your blood vessels. What increases the risk? Getting older. Having someone in your family who has kidney disease or kidney failure. Having a disease caused by genes. Taking medicines often that harm the kidneys. Being near or having contact with harmful substances. Being very overweight. Using tobacco now or in the past. What are the signs or symptoms? Feeling very tired. Having a swollen face, legs, ankles, or feet. Feeling like you may vomit or vomiting. Not feeling hungry. Being confused or not able to focus. Twitches and cramps in the leg muscles or other muscles. Dry, itchy skin. A taste of metal in your mouth. Making less pee, or making more pee. Shortness of breath. Trouble sleeping. You may also become anemic or get weak bones. Anemic means there is not enoughred blood cells or hemoglobin in your blood. You may get symptoms slowly. You may not notice them until the kidney damagegets very bad. How is this treated? Often, there is no cure for this disease. Treatment can help with symptoms and help keep the disease  from getting worse. You may need to: Avoid alcohol. Avoid foods that are high in salt, potassium, phosphorous, and protein. Take medicines for symptoms and to help control other conditions. Have dialysis. This treatment gets harmful waste out of your body. Treat other problems that cause your kidney disease or make it worse. Follow these instructions at home: Medicines Take over-the-counter and prescription medicines only as told by your doctor. Do not take any new medicines, vitamins, or supplements unless your doctor says it is okay. Lifestyle  Do not smoke or use any products that contain nicotine or tobacco. If you need help quitting, ask your doctor. If you drink alcohol: Limit how much you use to: 0-1 drink a day for women who are not pregnant. 0-2 drinks a day for men. Know how much alcohol is in your drink. In the [place] one drink equals one 12 oz bottle of beer (355 mL), one 5 oz glass of wine (148 mL), or one 1 oz glass of hard liquor (44 mL). Stay at a healthy weight. If you need help losing weight, ask your doctor.  General instructions  Follow instructions from your doctor about what you cannot eat or drink. Track your blood pressure at home. Tell your doctor about any changes. If you have diabetes, track your blood sugar. Exercise at least 30 minutes a day, 5 days a week. Keep your shots (vaccinations) up to date. Keep all follow-up visits.  Where to find more information American Association of Kidney Patients: BombTimer.gl National Kidney Foundation: www.kidney.org  American Kidney Fund: https://mathis.com/ Life Options: www.lifeoptions.org Kidney School: www.kidneyschool.org Contact a doctor if: Your symptoms get worse. You get new symptoms. Get help right away if: You get symptoms of end-stage kidney disease. These include: Headaches. Losing feeling in your hands or feet. Easy bruising. Having hiccups often. Chest pain. Shortness of breath. Lack of menstrual  periods, in women. You have a fever. You make less pee than normal. You have pain or you bleed when you pee or poop. These symptoms may be an emergency. Get help right away. Call your local emergency services (911 in the [place] Do not wait to see if the symptoms will go away. Do not drive yourself to the hospital. Summary Chronic kidney disease is when lasting damage happens to the kidneys slowly over a long time. Causes of this disease include diabetes and high blood pressure. Often, there is no cure for this disease. Treatment can help symptoms and help keep the disease from getting worse. Treatment may involve lifestyle changes, medicines, and dialysis. This information is not intended to replace advice given to you by your health care provider. Make sure you discuss any questions you have with your healthcare provider. Document Revised: 11/24/2019 Document Reviewed: 11/24/2019 Elsevier Patient Education  [place].

## 2021-03-08 NOTE — Progress Notes (Signed)
Subjective:    Patient ID: Jill Phillips, female    DOB: [birth year], 65 y.o.   MRN: [id number]  HPI The patient is here for elevated BP.    Her BP has been high at home - we did stop her maxzide on 02/12/21.  I sent in bystolic 20m to her pharmacy the beginning of this week - she is taking it daily along with her lotrel.  She started this Wednesday.  She did not take any medications today - she forgot it.     She is drinking more water-she is drinking 2.5 bottles of water.  She still drinks a very small amount of Diet Coke.  She no longer takes any NSAIDs.  Her pain medication has been decreased since she was here last.    Medications and allergies reviewed with patient and updated if appropriate.  Patient Active Problem List   Diagnosis Date Noted   Acute encephalopathy 02/05/2021   AKI (acute kidney injury) ([facility] 02/04/2021   Overdose 02/04/2021   Acute gout 06/20/2020   Interstitial cystitis 01/21/2020   Status post shoulder surgery 09/09/2019   Nausea 08/19/2019   Anxiety 05/04/2019   Insomnia 05/04/2019   Syncope 12/08/2018   Disorder of rotator cuff syndrome of left shoulder and allied disorder 06/22/2018   Bilateral leg edema 06/04/2018   Type 2 diabetes mellitus without complication, without long-term current use of insulin ([facility] 12/02/2017   Sacral pain 09/17/2017   COPD (chronic obstructive pulmonary disease) ([facility] 04/21/2017   Lower back pain 03/03/2017   CKD (chronic kidney disease) stage 3, GFR 30-59 ml/min (HCC) 08/07/2016   GERD (gastroesophageal reflux disease) 08/07/2016   Chronic respiratory failure ([facility] 07/29/2016   Arthritis of carpometacarpal (CMC) joint of left thumb 07/09/2016   Pneumothorax on left 01/31/2016   Chronic, continuous use of opioids 09/06/2015   Degenerative disc disease, lumbar 08/01/2015   Spondylolisthesis of lumbar region 08/01/2015   Urinary, incontinence, stress female 05/06/2014   Constipation 05/05/2014   HSV infection  07/14/2013   HTN (hypertension) 05/19/2013   HLD (hyperlipidemia) 05/19/2013   Depression 05/19/2013   Hypothyroidism 05/19/2013   Tobacco abuse    Osteoarthritis of both knees 11/18/2011   RLS (restless legs syndrome) 11/18/2011    Current Outpatient Medications on File Prior to Visit  Medication Sig Dispense Refill   acetaminophen (TYLENOL) 500 MG tablet Take 1,000 mg by mouth every 6 (six) hours as needed (for headaches.).     albuterol (PROAIR HFA) 108 (90 Base) MCG/ACT inhaler INHALE 2 PUFFS INTO THE LUNGS EVERY 6 HOURS AS NEEDED FOR WHEEZING OR SHORTNESS OF BREATH. (Patient taking differently: Inhale 2 puffs into the lungs every 6 (six) hours as needed for wheezing or shortness of breath.) 8.5 g 0   amLODipine-benazepril (LOTREL) 10-40 MG capsule Take 1 capsule by mouth daily.     atorvastatin (LIPITOR) 20 MG tablet TAKE 1 TABLET BY MOUTH DAILY. (Patient taking differently: Take 20 mg by mouth daily.) 90 tablet 1   cimetidine (TAGAMET) 400 MG tablet Take 400 mg by mouth in the morning.     clonazePAM (KLONOPIN) 1 MG tablet TAKE 1 TABLET BY MOUTH 2 TIMES DAILY AS NEEDED FOR ANXIETY. 60 tablet 0   docusate sodium (COLACE) 100 MG capsule Take 300 mg by mouth at bedtime.     fentaNYL (DURAGESIC) 12 MCG/HR 1 patch every 3 (three) days.     gabapentin (NEURONTIN) 100 MG capsule Take 1 capsule (100 mg total) by mouth 2 (  two) times daily. 60 capsule 2   HYDROcodone-acetaminophen (NORCO) 10-325 MG tablet Take 1 tablet by mouth every 6 (six) hours as needed for severe pain (Chronic back pain). (Patient taking differently: Take 1-4 tablets by mouth every 6 (six) hours as needed for severe pain (Chronic back pain). Patient takes medication BID) 84 tablet 0   levothyroxine (SYNTHROID) 88 MCG tablet TAKE 1 TABLET BY MOUTH DAILY. 90 tablet 1   NARCAN 4 MG/0.1ML LIQD nasal spray kit Place 1 spray into the nose as needed (accidental overdose). 1 each 0   nebivolol (BYSTOLIC) 5 MG tablet Take 1 tablet (5  mg total) by mouth daily. 30 tablet 5   omeprazole (PRILOSEC) 40 MG capsule Take 1 capsule (40 mg total) by mouth 2 (two) times daily. 180 capsule 1   QUEtiapine (SEROQUEL) 100 MG tablet TAKE 1 AND 1/2 TABLETS BY MOUTH AT BEDTIME. 135 tablet 1   rOPINIRole (REQUIP) 1 MG tablet Take 1-2 mg by mouth 2 (two) times daily as needed.     Sennosides (SENNA LAX PO) Take 2 tablets by mouth at bedtime.     solifenacin (VESICARE) 10 MG tablet Take 10 mg by mouth daily.     tiZANidine (ZANAFLEX) 4 MG tablet TAKE 1 TABLET BY MOUTH EVERY 8 HOURS AS NEEDED 90 tablet 2   valACYclovir (VALTREX) 500 MG tablet TAKE 1 TABLET (500 MG TOTAL) BY MOUTH DAILY. 90 tablet 1   venlafaxine XR (EFFEXOR-XR) 150 MG 24 hr capsule Take 2 capsules (300 mg total) by mouth daily with breakfast.     No current facility-administered medications on file prior to visit.    Past Medical History:  Diagnosis Date   Active smoker    Anxiety    Calcifying tendinitis of shoulder    Chronic back pain    Chronic pain syndrome    COPD (chronic obstructive pulmonary disease) (HCC)    Dysthymic disorder    Emphysema lung (HCC)    GERD (gastroesophageal reflux disease)    Headache    "weekly maybe" (01/31/2016)   Heart murmur    for years, nothing to be concerned about   Herpes genitalia    History of blood transfusion 1980   related to "back surgery"   Hyperlipidemia    Hypertension    Hypothyroidism    Lumbago    Osteoarthrosis, unspecified whether generalized or localized, lower leg    Pain in joint, upper arm    Pneumothorax, left 01/31/2016   S/P Left posterior subcostal pain injection on 01/30/2016   PONV (postoperative nausea and vomiting)    gets nauseous  with longer surgery. Difficuty voiding after surgery   Postlaminectomy syndrome, thoracic region    Primary localized osteoarthrosis, lower leg    Restless leg syndrome    Sleep apnea    s/p surgery- last sleep study 2011- doesnt use oxygen or machine at night as  instructed,.   12/2014- Dr Halford Chessman  reports it is negative.   Thyroid disease     Past Surgical History:  Procedure Laterality Date   APPENDECTOMY     BACK SURGERY     18 back surgeries (2 thoracic & 16 lumbar) (01/31/2016)   DILATION AND CURETTAGE OF UTERUS     HAMMER TOE SURGERY     IR RADIOLOGIST EVAL & MGMT  07/21/2017   JOINT REPLACEMENT     KNEE ARTHROSCOPY Right    LAPAROSCOPIC CHOLECYSTECTOMY     LUMBAR FUSION N/A 08/01/2015   Procedure: Right sided L1-2  and L2-3 transforaminal lumbar interbody fusion with cages, Extension of posterior fusion T12 to L3, Replaced pedicle screws bilaterally L1-L2 , Replaced left sided pedicle screws L-3. Instrumentation T12 to L3 using local bone graft, Vivigen allograft and cancellous chips;  Surgeon: [name], MD;  Location: [facility];  Service: Orthopedics;  Laterality: N/A;   LUMBAR LAMINECTOMY/DECOMPRESSION MICRODISCECTOMY N/A 01/28/2014   Procedure: Minimally Invasive Right  L1-2 Microdiscectomy;  Surgeon: [name], MD;  Location: [facility];  Service: Orthopedics;  Laterality: N/A;   REVERSE SHOULDER ARTHROPLASTY Left 09/09/2019   Procedure: LEFT REVERSE SHOULDER REPLACEMENT;  Surgeon: Meredith Pel, MD;  Location: [facility];  Service: Orthopedics;  Laterality: Left;   TOTAL HIP ARTHROPLASTY Right    TOTAL KNEE ARTHROPLASTY  05/29/2012   Procedure: TOTAL KNEE ARTHROPLASTY;  Surgeon: Mcarthur Rossetti, MD;  Location: [facility];  Service: Orthopedics;  Laterality: Right;  Right Total Knee Arthroplasty   TUBAL LIGATION     UVULOPALATOPHARYNGOPLASTY     VAGINAL HYSTERECTOMY      Social History   Socioeconomic History   Marital status: Divorced    Spouse name: n/a   Number of children: 2   Years of education: 12+   Highest education level: Not on file  Occupational History   Occupation: disability    Comment: back surgeries  Tobacco Use   Smoking status: Every Day    Packs/day: 1.25    Years: 45.00    Pack years: 56.25    Types:  Cigarettes   Smokeless tobacco: Never   Tobacco comments:    form given 12/25/16  Vaping Use   Vaping Use: Never used  Substance and Sexual Activity   Alcohol use: No    Alcohol/week: 0.0 standard drinks   Drug use: No    Types: Marijuana    Comment: 01/31/2016 "none since ~ 1980"   Sexual activity: Never    Partners: Male  Other Topics Concern   Not on file  Social History Narrative   Lives alone.  One daughter is local, but is getting ready to move to [place], where her children live with their father.  The other daughter lives near [place], [place].   Social Determinants of Health   Financial Resource Strain: Low Risk    Difficulty of Paying Living Expenses: Not very hard  Food Insecurity: Not on file  Transportation Needs: Not on file  Physical Activity: Not on file  Stress: Not on file  Social Connections: Not on file    Family History  Problem Relation Age of Onset   Kidney disease Mother    Heart disease Father    Anuerysm Brother 75       brain   Heart disease Brother    Heart disease Sister 9       s/p CABG   Hypertension Sister    Colon cancer Neg Hx     Review of Systems  Constitutional:  Negative for fever.  Respiratory:  Negative for shortness of breath.   Cardiovascular:  Negative for chest pain, palpitations and leg swelling.  Neurological:  Negative for light-headedness and headaches.      Objective:   Vitals:   03/09/21 1117  BP: (!) 148/82  Pulse: 80  Temp: 98.2 F (36.8 C)  SpO2: 95%   BP Readings from Last 3 Encounters:  03/09/21 (!) 148/82  02/12/21 140/80  02/06/21 (!) 115/50   Wt Readings from Last 3 Encounters:  03/09/21 152 lb 12.8 oz (69.3 kg)  02/12/21 156 lb 6.4 oz (70.9 kg)  02/05/21 145 lb (65.8 kg)   Body mass index is 26.23 kg/m.   Physical Exam    Constitutional: Appears well-developed and well-nourished. No distress.  HENT:  Head: Normocephalic and atraumatic.  Neck: Neck supple. No tracheal deviation  present. No thyromegaly present.  No cervical lymphadenopathy Cardiovascular: Normal rate, regular rhythm and normal heart sounds.   No murmur heard. No carotid bruit .  No edema Pulmonary/Chest: Effort normal and breath sounds normal. No respiratory distress. No has no wheezes. No rales.  Skin: Skin is warm and dry. Not diaphoretic.  Psychiatric: Normal mood and affect. Behavior is normal.      Assessment & Plan:    See Problem List for Assessment and Plan of chronic medical problems.    This visit occurred during the SARS-CoV-2 public health emergency.  Safety protocols were in place, including screening questions prior to the visit, additional usage of staff PPE, and extensive cleaning of exam room while observing appropriate contact time as indicated for disinfecting solutions.

## 2021-03-09 ENCOUNTER — Ambulatory Visit (INDEPENDENT_AMBULATORY_CARE_PROVIDER_SITE_OTHER): Payer: Medicare Other | Admitting: Internal Medicine

## 2021-03-09 ENCOUNTER — Other Ambulatory Visit: Payer: Self-pay

## 2021-03-09 VITALS — BP 148/82 | HR 80 | Temp 98.2°F | Ht 64.0 in | Wt 152.8 lb

## 2021-03-09 DIAGNOSIS — E119 Type 2 diabetes mellitus without complications: Secondary | ICD-10-CM

## 2021-03-09 DIAGNOSIS — I10 Essential (primary) hypertension: Secondary | ICD-10-CM | POA: Diagnosis not present

## 2021-03-09 DIAGNOSIS — N1832 Chronic kidney disease, stage 3b: Secondary | ICD-10-CM

## 2021-03-09 LAB — CBC WITH DIFFERENTIAL/PLATELET
Basophils Absolute: 0 10*3/uL (ref 0.0–0.1)
Basophils Relative: 0.5 % (ref 0.0–3.0)
Eosinophils Absolute: 0.1 10*3/uL (ref 0.0–0.7)
Eosinophils Relative: 1.2 % (ref 0.0–5.0)
HCT: 40.6 % (ref 36.0–46.0)
Hemoglobin: 13.6 g/dL (ref 12.0–15.0)
Lymphocytes Relative: 26.8 % (ref 12.0–46.0)
Lymphs Abs: 2.5 10*3/uL (ref 0.7–4.0)
MCHC: 33.5 g/dL (ref 30.0–36.0)
MCV: 93.2 fl (ref 78.0–100.0)
Monocytes Absolute: 0.5 10*3/uL (ref 0.1–1.0)
Monocytes Relative: 5.4 % (ref 3.0–12.0)
Neutro Abs: 6.3 10*3/uL (ref 1.4–7.7)
Neutrophils Relative %: 66.1 % (ref 43.0–77.0)
Platelets: 264 10*3/uL (ref 150.0–400.0)
RBC: 4.35 Mil/uL (ref 3.87–5.11)
RDW: 17 % — ABNORMAL HIGH (ref 11.5–15.5)
WBC: 9.5 10*3/uL (ref 4.0–10.5)

## 2021-03-09 LAB — BASIC METABOLIC PANEL
BUN: 27 mg/dL — ABNORMAL HIGH (ref 6–23)
CO2: 31 mEq/L (ref 19–32)
Calcium: 9.6 mg/dL (ref 8.4–10.5)
Chloride: 101 mEq/L (ref 96–112)
Creatinine, Ser: 1.46 mg/dL — ABNORMAL HIGH (ref 0.40–1.20)
GFR: 37.39 mL/min — ABNORMAL LOW (ref >60.00)
Glucose, Bld: 90 mg/dL (ref 70–99)
Potassium: 5 mEq/L (ref 3.5–5.1)
Sodium: 141 mEq/L (ref 135–145)

## 2021-03-09 LAB — HEMOGLOBIN A1C: Hgb A1c MFr Bld: 6.5 % (ref 4.6–6.5)

## 2021-03-09 NOTE — Assessment & Plan Note (Signed)
Chronic There was improvement after stopping NSAIDs, hydrochlorothiazide and Maxide Has an appointment with nephrology later this month Stressed good BP control-goal less than 130/80 Sugars have been controlled without medication She has increased her water Encouraged to avoid unnecessary medications

## 2021-03-09 NOTE — Assessment & Plan Note (Signed)
Chronic Diet controlled Check A1c  

## 2021-03-09 NOTE — Assessment & Plan Note (Signed)
Chronic Not controlled Today she did not take her medications so it is difficult to tell where her blood pressure would have been if she had taken it Continue amlodipine-benazepril 10-40 mg daily Just started Bystolic 5 mg daily Discussed goal BP less than 130/80 and the importance of keeping her blood pressure control to prevent further damage to her kidneys BMP

## 2021-03-14 ENCOUNTER — Ambulatory Visit: Payer: Medicare Other | Admitting: Internal Medicine

## 2021-03-19 ENCOUNTER — Other Ambulatory Visit: Payer: Self-pay | Admitting: Internal Medicine

## 2021-03-19 DIAGNOSIS — N184 Chronic kidney disease, stage 4 (severe): Secondary | ICD-10-CM | POA: Diagnosis not present

## 2021-03-19 DIAGNOSIS — Z79899 Other long term (current) drug therapy: Secondary | ICD-10-CM | POA: Diagnosis not present

## 2021-03-19 DIAGNOSIS — N3010 Interstitial cystitis (chronic) without hematuria: Secondary | ICD-10-CM | POA: Diagnosis not present

## 2021-03-19 DIAGNOSIS — M545 Low back pain, unspecified: Secondary | ICD-10-CM | POA: Diagnosis not present

## 2021-03-22 DIAGNOSIS — Z79899 Other long term (current) drug therapy: Secondary | ICD-10-CM | POA: Diagnosis not present

## 2021-03-27 ENCOUNTER — Other Ambulatory Visit: Payer: Self-pay | Admitting: Internal Medicine

## 2021-03-29 DIAGNOSIS — N1832 Chronic kidney disease, stage 3b: Secondary | ICD-10-CM | POA: Diagnosis not present

## 2021-03-29 DIAGNOSIS — J449 Chronic obstructive pulmonary disease, unspecified: Secondary | ICD-10-CM | POA: Diagnosis not present

## 2021-03-29 DIAGNOSIS — I129 Hypertensive chronic kidney disease with stage 1 through stage 4 chronic kidney disease, or unspecified chronic kidney disease: Secondary | ICD-10-CM | POA: Diagnosis not present

## 2021-03-29 DIAGNOSIS — G8929 Other chronic pain: Secondary | ICD-10-CM | POA: Diagnosis not present

## 2021-03-29 DIAGNOSIS — Z720 Tobacco use: Secondary | ICD-10-CM | POA: Diagnosis not present

## 2021-03-29 DIAGNOSIS — E785 Hyperlipidemia, unspecified: Secondary | ICD-10-CM | POA: Diagnosis not present

## 2021-03-29 DIAGNOSIS — E039 Hypothyroidism, unspecified: Secondary | ICD-10-CM | POA: Diagnosis not present

## 2021-04-02 ENCOUNTER — Other Ambulatory Visit: Payer: Self-pay | Admitting: Internal Medicine

## 2021-04-04 ENCOUNTER — Other Ambulatory Visit: Payer: Self-pay | Admitting: Internal Medicine

## 2021-04-04 ENCOUNTER — Ambulatory Visit: Payer: Self-pay

## 2021-04-04 ENCOUNTER — Ambulatory Visit (INDEPENDENT_AMBULATORY_CARE_PROVIDER_SITE_OTHER): Payer: Medicare Other | Admitting: Surgery

## 2021-04-04 ENCOUNTER — Other Ambulatory Visit: Payer: Self-pay

## 2021-04-04 DIAGNOSIS — M533 Sacrococcygeal disorders, not elsewhere classified: Secondary | ICD-10-CM | POA: Diagnosis not present

## 2021-04-04 DIAGNOSIS — M1712 Unilateral primary osteoarthritis, left knee: Secondary | ICD-10-CM

## 2021-04-04 DIAGNOSIS — G8929 Other chronic pain: Secondary | ICD-10-CM | POA: Diagnosis not present

## 2021-04-04 DIAGNOSIS — M545 Low back pain, unspecified: Secondary | ICD-10-CM

## 2021-04-04 DIAGNOSIS — M25562 Pain in left knee: Secondary | ICD-10-CM

## 2021-04-04 NOTE — Progress Notes (Signed)
Office Visit Note   Patient: Jill Phillips           Date of Birth: [birth year]           MRN: [id number] Visit Date: 04/04/2021              Requested by: [name], MD [place],  [place] [address] PCP: [name], MD   Assessment & Plan: Visit Diagnoses:  1. Low back pain, unspecified back pain laterality, unspecified chronicity, unspecified whether sciatica present   2. Left knee pain, unspecified chronicity   3. Primary osteoarthritis of left knee   4. Chronic right SI joint pain   5. Chronic left SI joint pain     Plan: In hopes of giving patient some improvement of her low back pain made appointment with Dr. Junius Roads to perform ultrasound-guided bilateral SI joint Marcaine/Depo-Medrol injections.  Follow-up with Dr. [name] in 6 weeks to check her response.  Also notes given her some relief of her left knee pain today offered to perform an intra-articular injection.  After patient consent left knee injection was done.  Tolerated without complication.  Dr. [name] will see how she is doing at return office visit.  Follow-Up Instructions: Return for 1 week [facility] for bilat SI joint injections.  6 weeks Nitka for recheck lumbar and knee.   Orders:  Orders Placed This Encounter  Procedures   Large Joint Inj: L knee   XR Lumbar Spine 2-3 Views   XR Knee 1-2 Views Left   No orders of the defined types were placed in this encounter.     Procedures: Large Joint Inj: L knee on 04/04/2021 11:11 AM Indications: pain Details: 25 G 1.5 in needle, anteromedial approach Medications: 3 mL lidocaine 1 %; 6 mL bupivacaine 0.25 %; 40 mg methylPREDNISolone acetate 40 MG/ML Outcome: tolerated well, no immediate complications Consent was given by the patient. Patient was prepped and draped in the usual sterile fashion.      Clinical Data: No additional findings.   Subjective: Chief Complaint  Patient presents with   Lower Back - Pain   Left Knee - Pain     HPI 65 year old white female with history of chronic back pain returns.  Today she localizes most of her pain to the bilateral SI joints.  Pain when she is ambulating and sitting.  No recent injury.  States it is difficult to work due to the pain.  Patient is also been having pain in the left knee.  She has known history of left knee DJD.  Pain off and on for about a year.  She was seen for her knee November 2020 and had x-rays. Review of Systems No current cardiac pulmonary GI GU issues  Objective: Vital Signs: There were no vitals taken for this visit.  Physical Exam HENT:     Head: Normocephalic and atraumatic.  Pulmonary:     Effort: Pulmonary effort is normal.  Musculoskeletal:     Comments: Gait is still antalgic.  Exam she has moderate to marked tenderness over the bilateral SI joints.  Negative logroll bilateral hips.  Negative straight leg raise.  Left knee good range of motion.  Some crepitus.  Joint line tender.  Ligaments stable.  Neurological:     General: No focal deficit present.     Mental Status: She is alert and oriented to person, place, and time.    Ortho Exam  Specialty Comments:  No specialty comments available.  Imaging: No results found.   PMFS History: Patient Active Problem List   Diagnosis Date Noted   AKI (acute kidney injury) ([facility]) 02/04/2021   Overdose 02/04/2021   Acute gout 06/20/2020   Interstitial cystitis 01/21/2020   Status post shoulder surgery 09/09/2019   Nausea 08/19/2019   Anxiety 05/04/2019   Insomnia 05/04/2019   Syncope 12/08/2018   Disorder of rotator cuff syndrome of left shoulder and allied disorder 06/22/2018   Bilateral leg edema 06/04/2018   Type 2 diabetes mellitus without complication, without long-term current use of insulin ([facility]) 12/02/2017   Sacral pain 09/17/2017   COPD (chronic obstructive pulmonary disease) ([facility]) 04/21/2017   Lower back pain 03/03/2017   CKD (chronic kidney disease) stage 3, GFR 30-59  ml/min ([facility]) 08/07/2016   GERD (gastroesophageal reflux disease) 08/07/2016   Chronic respiratory failure ([facility]) 07/29/2016   Arthritis of carpometacarpal (CMC) joint of left thumb 07/09/2016   Pneumothorax on left 01/31/2016   Chronic, continuous use of opioids 09/06/2015   Degenerative disc disease, lumbar 08/01/2015    Class: Chronic   Spondylolisthesis of lumbar region 08/01/2015    Class: Chronic   Urinary, incontinence, stress female 05/06/2014   Constipation 05/05/2014   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/14/2013   HTN (hypertension) 05/19/2013   HLD (hyperlipidemia) 05/19/2013   Depression 05/19/2013   Hypothyroidism 05/19/2013   Tobacco abuse    Osteoarthritis of both knees 11/18/2011   RLS (restless legs syndrome) 11/18/2011   Past Medical History:  Diagnosis Date   Active smoker    Anxiety    Calcifying tendinitis of shoulder    Chronic back pain    Chronic pain syndrome    COPD (chronic obstructive pulmonary disease) ([facility])    Dysthymic disorder    Emphysema lung ([facility])    GERD (gastroesophageal reflux disease)    Headache    "weekly maybe" (01/31/2016)   Heart murmur    for years, nothing to be concerned about   Herpes genitalia    History of blood transfusion 1980   related to "back surgery"   Hyperlipidemia    Hypertension    Hypothyroidism    Lumbago    Osteoarthrosis, unspecified whether generalized or localized, lower leg    Pain in joint, upper arm    Pneumothorax, left 01/31/2016   S/P Left posterior subcostal pain injection on 01/30/2016   PONV (postoperative nausea and vomiting)    gets nauseous  with longer surgery. Difficuty voiding after surgery   Postlaminectomy syndrome, thoracic region    Primary localized osteoarthrosis, lower leg    Restless leg syndrome    Sleep apnea    s/p surgery- last sleep study 2011- doesnt use oxygen or machine at night as instructed,.   12/2014- Dr Halford Chessman  reports it is negative.   Thyroid disease     Family History  Problem  Relation Age of Onset   Kidney disease Mother    Heart disease Father    Anuerysm Brother 86       brain   Heart disease Brother    Heart disease Sister 70       s/p CABG   Hypertension Sister    Colon cancer Neg Hx     Past Surgical History:  Procedure Laterality Date   APPENDECTOMY     BACK SURGERY     18 back surgeries (2 thoracic & 16 lumbar) (01/31/2016)   DILATION AND CURETTAGE OF UTERUS     HAMMER TOE SURGERY     IR RADIOLOGIST  EVAL & MGMT  07/21/2017   JOINT REPLACEMENT     KNEE ARTHROSCOPY Right    LAPAROSCOPIC CHOLECYSTECTOMY     LUMBAR FUSION N/A 08/01/2015   Procedure: Right sided L1-2 and L2-3 transforaminal lumbar interbody fusion with cages, Extension of posterior fusion T12 to L3, Replaced pedicle screws bilaterally L1-L2 , Replaced left sided pedicle screws L-3. Instrumentation T12 to L3 using local bone graft, Vivigen allograft and cancellous chips;  Surgeon: [name], MD;  Location: [facility];  Service: Orthopedics;  Laterality: N/A;   LUMBAR LAMINECTOMY/DECOMPRESSION MICRODISCECTOMY N/A 01/28/2014   Procedure: Minimally Invasive Right  L1-2 Microdiscectomy;  Surgeon: [name], MD;  Location: [facility];  Service: Orthopedics;  Laterality: N/A;   REVERSE SHOULDER ARTHROPLASTY Left 09/09/2019   Procedure: LEFT REVERSE SHOULDER REPLACEMENT;  Surgeon: Meredith Pel, MD;  Location: [facility];  Service: Orthopedics;  Laterality: Left;   TOTAL HIP ARTHROPLASTY Right    TOTAL KNEE ARTHROPLASTY  05/29/2012   Procedure: TOTAL KNEE ARTHROPLASTY;  Surgeon: Mcarthur Rossetti, MD;  Location: [facility];  Service: Orthopedics;  Laterality: Right;  Right Total Knee Arthroplasty   TUBAL LIGATION     UVULOPALATOPHARYNGOPLASTY     VAGINAL HYSTERECTOMY     Social History   Occupational History   Occupation: disability    Comment: back surgeries  Tobacco Use   Smoking status: Every Day    Packs/day: 1.25    Years: 45.00    Pack years: 56.25    Types: Cigarettes   Smokeless  tobacco: Never   Tobacco comments:    form given 12/25/16  Vaping Use   Vaping Use: Never used  Substance and Sexual Activity   Alcohol use: No    Alcohol/week: 0.0 standard drinks   Drug use: No    Types: Marijuana    Comment: 01/31/2016 "none since ~ 1980"   Sexual activity: Never    Partners: Male

## 2021-04-10 ENCOUNTER — Telehealth: Payer: Self-pay | Admitting: Internal Medicine

## 2021-04-10 MED ORDER — NEBIVOLOL HCL 5 MG PO TABS: 10.0000 mg | ORAL_TABLET | Freq: Every day | ORAL | 5 refills | Status: DC

## 2021-04-10 NOTE — Telephone Encounter (Signed)
Increase Bystolic to 10 mg daily.  She should come in for an appointment in a day or 2.

## 2021-04-10 NOTE — Telephone Encounter (Signed)
Will schedule her for Friday. She wants the 340 slot.

## 2021-04-10 NOTE — Telephone Encounter (Signed)
Patient calling in concerned about HBP shes been having for a few days.  Yesterdays reading was 179-97  Patient says she is having severe headaches as well    Wants to know if she needs to change dosage for BP meds  Please contact patient [phone]

## 2021-04-11 ENCOUNTER — Ambulatory Visit: Payer: Medicare Other | Admitting: Family Medicine

## 2021-04-12 ENCOUNTER — Telehealth: Payer: Medicare Other

## 2021-04-12 ENCOUNTER — Telehealth: Payer: Self-pay | Admitting: Pharmacist

## 2021-04-12 NOTE — Telephone Encounter (Signed)
  Chronic Care Management   Outreach Note  04/12/2021 Name: Jill Phillips MRN: [id number] DOB: [birth year]  Referred by: [name], MD  Patient had a phone appointment scheduled with clinical pharmacist today.  An unsuccessful telephone outreach was attempted today. The patient was referred to the pharmacist for assistance with medications, care management and care coordination.   Patient will NOT be penalized in any way for missing a CCM appointment. The no-show fee does not apply.  If possible, a message was left to return call to: [phone] or to [facility]: [place], PharmD, Para [place], CPP Clinical Pharmacist [facility] [phone]

## 2021-04-12 NOTE — Progress Notes (Deleted)
Chronic Care Management Pharmacy Note  04/12/2021 Name:  Jill Phillips MRN:  [id number] DOB:  07/27/1956  Subjective: Jill Phillips is an 65 y.o. year old female who is a primary patient of Burns, Claudina Lick, MD.  The CCM team was consulted for assistance with disease management and care coordination needs.    Engaged with patient by telephone for follow up visit in response to provider referral for pharmacy case management and/or care coordination services.   Consent to Services:  The patient was given information about Chronic Care Management services, agreed to services, and gave verbal consent prior to initiation of services.  Please see initial visit note for detailed documentation.   Patient Care Team: [name], MD as PCP - General (Internal Medicine) Charlton Haws, [facility] as Pharmacist (Pharmacist)  Patient's daughter lives with her and "is not doing what she's supposed to" and this causes her a lot of stress. Patient saw pain management doctor yesterday, they did a chest Xray and found an infection, gave her antibiotic and steroid injections but did not prescribe oral medications. 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 Pt reports she is very stressed about all of this as well.  Recent office visits: 03/09/21 Dr Quay Burow OV: f/u DM, HTN, CKD. Reduced fentanyl patch to 12 mcg/hr. Increased ropinirole to 1-2 mg BID. Rx'd Bystolic 5 mg previously.  02/12/21 Dr Quay Burow OV: f/u HTN; restarted amlodipine-benazepril. Stay off triamterene-hctz.   06/28/20 pt call - switched zolpidem back to seroquel at pt's request. 06/20/20 Dr Quay Burow OV: chronic f/u - c/o urinary frequency, LE swelling, toe pain, depression, BP elevated d/t stress, insomnia. Acute gout - rx'd prednisone taper. Insomnia - dc'd seroquel, started Zolpidem 5 mg. Added hydralazine 25 mg TID for BP.   Recent consult visits: 08/24/20  NP Justin Mend ([facility] Banas): referred to GI for epigastric pain, emesis.  08/11/20 Dr [name] (orthopedic surg): f/u DDD. No med changes.  Recent hospital visits: Medication Reconciliation was completed by comparing discharge summary, patient's EMR and Pharmacy list, and upon discussion with patient.   Admitted to the hospital on 02/04/21 due to acute encephalopathy. Discharge date was 02/06/21. Discharged from [facility].    Medications Discontinued at Hospital Discharge: -Stopped amlodipine-benazepril and triamterene-hctz due to AKI  Medications that remain the same after Hospital Discharge:??  -All other medications will remain the same.    Objective:  Lab Results  Component Value Date   CREATININE 1.46 (H) 03/09/2021   BUN 27 (H) 03/09/2021   GFR 37.39 (L) 03/09/2021   GFRNONAA 36 (L) 02/06/2021   GFRAA 59 (L) 09/07/2019   NA 141 03/09/2021   K 5.0 03/09/2021   CALCIUM 9.6 03/09/2021   CO2 31 03/09/2021    Lab Results  Component Value Date/Time   HGBA1C 6.5 03/09/2021 12:18 PM   HGBA1C 6.7 (H) 12/29/2020 01:30 PM   GFR 37.39 (L) 03/09/2021 12:18 PM   GFR 31.16 (L) 02/12/2021 03:49 PM    Last diabetic Eye exam:  Lab Results  Component Value Date/Time   HMDIABEYEEXA No Retinopathy 12/09/2017 12:31 PM    Last diabetic Foot exam: No results found for: HMDIABFOOTEX   Lab Results  Component Value Date   CHOL 206 (H) 12/29/2020   HDL 42.50 12/29/2020   LDLCALC 86 11/03/2018   LDLDIRECT 129.0 12/29/2020   TRIG 257.0 (H) 12/29/2020   CHOLHDL 5 12/29/2020  Hepatic Function Latest Ref Rng & Units 02/04/2021 12/29/2020 06/20/2020  Total Protein 6.5 - 8.1 g/dL 7.0 8.0 7.6  Albumin 3.5 - 5.0 g/dL 3.8 4.5 4.5  AST 15 - 41 U/L 41 14 15  ALT 0 - 44 U/L _0 Alk Phosphatase 38 - 126 U/L 129(H) 168(H) 145(H)  Total Bilirubin 0.3 - 1.2 mg/dL 1.0 0.4 0.3    Lab Results  Component Value Date/Time   TSH 1.47 12/29/2020 01:30 PM   TSH 3.65 06/20/2020 12:15 PM   FREET4  0.91 05/22/2015 03:52 PM   FREET4 0.88 01/18/2015 12:36 PM    CBC Latest Ref Rng & Units 03/09/2021 02/06/2021 02/05/2021  WBC 4.0 - 10.5 K/uL 9.5 6.3 6.3  Hemoglobin 12.0 - 15.0 g/dL 13.6 10.7(L) 8.7(L)  Hematocrit 36.0 - 46.0 % 40.6 34.8(L) 28.6(L)  Platelets 150.0 - 400.0 K/uL 264.0 215 171   No results found for: VD25OH  Clinical ASCVD: No  The 10-year ASCVD risk score Mikey Bussing DC Jr., et al., 2013) is: 36.7%   Values used to calculate the score:     Age: 22 years     Sex: Female     Is Non-Hispanic African American: No     Diabetic: Yes     Tobacco smoker: Yes     Systolic Blood Pressure: 209 mmHg     Is BP treated: Yes     HDL Cholesterol: 42.5 mg/dL     Total Cholesterol: 206 mg/dL    Depression screen [facility] 2/9 08/24/2020 04/04/2020 12/09/2018  Decreased Interest 0 0 0  Down, Depressed, Hopeless 0 0 -  PHQ - 2 Score 0 0 0  Altered sleeping - 2 -  Tired, decreased energy - 0 -  Change in appetite - 0 -  Feeling bad or failure about yourself  - 0 -  Trouble concentrating - 0 -  Moving slowly or fidgety/restless - 0 -  Suicidal thoughts - 0 -  PHQ-9 Score - 2 -  Difficult doing work/chores - Not difficult at all -  Some recent data might be hidden   No flowsheet data found.  Social History   Tobacco Use  Smoking Status Every Day   Packs/day: 1.25   Years: 45.00   Pack years: 56.25   Types: Cigarettes  Smokeless Tobacco Never  Tobacco Comments   form given 12/25/16    BP Readings from Last 3 Encounters:  03/09/21 (!) 148/82  02/12/21 140/80  02/06/21 (!) 115/50   Pulse Readings from Last 3 Encounters:  03/09/21 80  02/12/21 93  02/06/21 83   Wt Readings from Last 3 Encounters:  03/09/21 152 lb 12.8 oz (69.3 kg)  02/12/21 156 lb 6.4 oz (70.9 kg)  02/05/21 145 lb (65.8 kg)    Assessment/Interventions: Review of patient past medical history, allergies, medications, health status, including review of consultants reports, laboratory and other test data, was  performed as part of comprehensive evaluation and provision of chronic care management services.   SDOH:  (Social Determinants of Health) assessments and interventions performed:     CCM Care Plan  Allergies  Allergen Reactions   Flagyl [Metronidazole] Diarrhea   Limonene Itching    Yeast infection in mouth   Sulfa Antibiotics Other (See Comments)    Yeast infection in mouth   Doxycycline     Mouth soreness - reaction vs thrush?   Amoxicillin Other (See Comments)    REACTION: Oral yeast infection Has patient had a PCN reaction causing immediate  rash, facial/tongue/throat swelling, SOB or lightheadedness with hypotension: No Has patient had a PCN reaction causing severe rash involving mucus membranes or skin necrosis: No Has patient had a PCN reaction that required hospitalization No Has patient had a PCN reaction occurring within the last 10 years: No If all of the above answers are "NO", then may proceed with Cephalosporin use.    Chlorzoxazone Other (See Comments)    headache   Codeine Other (See Comments)    headache   Darvocet [Propoxyphene N-Acetaminophen] Itching   Dilaudid [Hydromorphone Hcl] Itching   Keflex [Cephalexin] Other (See Comments)    Pt does not recall reaction (maybe yeast infection)   Morphine And Related Itching   Nitrofurantoin Monohyd Macro Hives    Reaction to [employer] [Oxycodone-Acetaminophen] Itching    Patient can tolerate Acetaminophen solely   Trazodone And Nefazodone Palpitations    Medications Reviewed Today     Reviewed by Minda Ditto, Alyse Low, RT (Technologist) on 04/04/21 at [facility] List Status: <None>   Medication Order Taking? Sig Documenting Provider Last Dose Status Informant  acetaminophen (TYLENOL) 500 MG tablet [id number] No Take 1,000 mg by mouth every 6 (six) hours as needed (for headaches.). [provider] Taking Active Self  albuterol (PROAIR HFA) 108 (90 Base) MCG/ACT inhaler [id number] No INHALE 2 PUFFS  INTO THE LUNGS EVERY 6 HOURS AS NEEDED FOR WHEEZING OR SHORTNESS OF BREATH.  Patient taking differently: Inhale 2 puffs into the lungs every 6 (six) hours as needed for wheezing or shortness of breath.   [name], MD Taking Active   amLODipine-benazepril (LOTREL) 10-40 MG capsule [id number] No Take 1 capsule by mouth daily. [name], MD Taking Active   atorvastatin (LIPITOR) 20 MG tablet [id number] No TAKE 1 TABLET BY MOUTH DAILY.  Patient taking differently: Take 20 mg by mouth daily.   [name], MD Taking Active   cimetidine (TAGAMET) 400 MG tablet [id number] No Take 400 mg by mouth in the morning. [provider] Taking Active Self           Med Note (ROBERTSON, CRYSTAL S   Mon Feb 05, 2021 10:54 AM) Pt says this was give to her for rectal pain, but it is not helping.  clonazePAM (KLONOPIN) 1 MG tablet [id number]  TAKE 1 TABLET BY MOUTH 2 TIMES DAILY AS NEEDED FOR ANXIETY. [name], MD  Active   docusate sodium (COLACE) 100 MG capsule [id number] No Take 300 mg by mouth at bedtime. [provider] Taking Active Self  fentaNYL (DURAGESIC) 12 MCG/HR [id number] No 1 patch every 3 (three) days. [provider] Taking Active   gabapentin (NEURONTIN) 100 MG capsule [id number] No Take 1 capsule (100 mg total) by mouth 2 (two) times daily. Dessa Phi, DO Taking Active   HYDROcodone-acetaminophen [facility]) 10-325 MG tablet [id number] No Take 1 tablet by mouth every 6 (six) hours as needed for severe pain (Chronic back pain).  Patient taking differently: Take 1-4 tablets by mouth every 6 (six) hours as needed for severe pain (Chronic back pain). Patient takes medication BID   [name], MD Taking Active   levothyroxine (SYNTHROID) 88 MCG tablet [id number] No TAKE 1 TABLET BY MOUTH DAILY. [name], MD Taking Active   NARCAN 4 MG/0.1ML LIQD nasal spray kit [id number] No Place 1 spray into the nose as needed (accidental overdose). [name], MD Taking  Active Self  nebivolol (BYSTOLIC) 5 MG tablet [id number] No Take  1 tablet (5 mg total) by mouth daily. [name], MD Taking Active   omeprazole (PRILOSEC) 40 MG capsule [id number] No Take 1 capsule (40 mg total) by mouth 2 (two) times daily. [name], MD Taking Active   QUEtiapine (SEROQUEL) 100 MG tablet [id number] No TAKE 1 AND 1/2 TABLETS BY MOUTH AT BEDTIME. [name], MD Taking Active   rOPINIRole (REQUIP) 1 MG tablet [id number] No Take 1-2 mg by mouth 2 (two) times daily as needed. [provider] Taking Active   Sennosides (SENNA LAX PO) [id number] No Take 2 tablets by mouth at bedtime. [provider] Taking Active Self  solifenacin (VESICARE) 10 MG tablet [id number] No Take 10 mg by mouth daily. [provider] Taking Active Self  tiZANidine (ZANAFLEX) 4 MG tablet [id number] No TAKE 1 TABLET BY MOUTH EVERY 8 HOURS AS NEEDED [name], MD Taking Active   valACYclovir (VALTREX) 500 MG tablet [id number] No TAKE 1 TABLET (500 MG TOTAL) BY MOUTH DAILY. [name], MD Taking Active Self  venlafaxine XR (EFFEXOR-XR) 150 MG 24 hr capsule [id number]  TAKE 2 CAPSULES BY MOUTH DAILY. ***NEED OFFICE VISIT FOR REFILLS*** [name], MD  Active   Med List Note Payton Doughty, CPhT 07/26/15 1253): Pain management with Dr. Alger Simons            Patient Active Problem List   Diagnosis Date Noted   AKI (acute kidney injury) ([place]) 02/04/2021   Overdose 02/04/2021   Acute gout 06/20/2020   Interstitial cystitis 01/21/2020   Status post shoulder surgery 09/09/2019   Nausea 08/19/2019   Anxiety 05/04/2019   Insomnia 05/04/2019   Syncope 12/08/2018   Disorder of rotator cuff syndrome of left shoulder and allied disorder 06/22/2018   Bilateral leg edema 06/04/2018   Type 2 diabetes mellitus without complication, without long-term current use of insulin ([place]) 12/02/2017   Sacral pain 09/17/2017   COPD (chronic obstructive pulmonary disease)  ([place]) 04/21/2017   Lower back pain 03/03/2017   CKD (chronic kidney disease) stage 3, GFR 30-59 ml/min (HCC) 08/07/2016   GERD (gastroesophageal reflux disease) 08/07/2016   Chronic respiratory failure ([place]) 07/29/2016   Arthritis of carpometacarpal (CMC) joint of left thumb 07/09/2016   Pneumothorax on left 01/31/2016   Chronic, continuous use of opioids 09/06/2015   Degenerative disc disease, lumbar 08/01/2015    Class: Chronic   Spondylolisthesis of lumbar region 08/01/2015    Class: Chronic   Urinary, incontinence, stress female 05/06/2014   Constipation 05/05/2014   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/14/2013   HTN (hypertension) 05/19/2013   HLD (hyperlipidemia) 05/19/2013   Depression 05/19/2013   Hypothyroidism 05/19/2013   Tobacco abuse    Osteoarthritis of both knees 11/18/2011   RLS (restless legs syndrome) 11/18/2011    Immunization History  Administered Date(s) Administered   Influenza, Seasonal, Injecte, Preservative Fre 08/05/2012   Influenza,inj,Quad PF,6+ Mos 05/05/2014, 05/22/2015, 07/29/2016, 06/04/2018   Influenza-Unspecified 07/16/2017, 07/30/2020   Moderna Sars-Covid-2 Vaccination 01/13/2020, 07/30/2020   Pneumococcal Conjugate-13 12/02/2017   Tdap 07/14/2013    Conditions to be addressed/monitored:  HTN, HLD and COPD  There are no care plans that you recently modified to display for this patient.   Medication Assistance: None required.  Patient affirms current coverage meets needs.  Patient's preferred pharmacy is:  [facility], [address] [place] [address] Phone: [phone] Fax: [phone]  [place], [place] -  [place] [address] Phone: [phone] Fax: [place] [address] [address] Phone: [phone] Fax: [phone]  Uses pill box? Yes Pt endorses 100%  compliance  We discussed: Current pharmacy is preferred with insurance plan and patient is satisfied with pharmacy services - pharmacy delivery  Patient decided to: Continue current medication management strategy   Follow Up:  Patient agrees to Care Plan and Follow-up.  Plan: Telephone follow up appointment with care management team member scheduled for:  2 months  Charlene Brooke, PharmD, [place], CPP Clinical Pharmacist [facility] [phone]    Current Barriers:  Unable to independently monitor therapeutic efficacy Unable to achieve control of BP   Pharmacist Clinical Goal(s):  Over the next 60 days, patient will achieve adherence to monitoring guidelines and medication adherence to achieve therapeutic efficacy achieve control of BP as evidenced by home BP readings through collaboration with PharmD and provider.   Interventions: 1:1 collaboration with [name], MD regarding development and update of comprehensive plan of care as evidenced by provider attestation and co-signature Inter-disciplinary care team collaboration (see longitudinal plan of care) Comprehensive medication review performed; medication list updated in electronic medical record  Hypertension (BP goal <130/80) -{US controlled/uncontrolled:25276}  -Current home BP readings: *** -Current treatment: Amlodipine-benazepril 10-40 mg daily Nebivolol 5 mg daily -Medications previously tried: clonidine, HCTZ, propranolol, triamterene -Denies hypotensive/hypertensive symptoms -Educated on BP goals and benefits of medications for prevention of heart attack, stroke and kidney damage; Importance of home blood pressure monitoring;  -Counseled to monitor BP at home 2-3 times weekly, document, and provide log at future appointments -Counseled on diet and exercise extensively Recommended to continue current medication  -Patient has cardiology appt next week, will defer med  changes  Hyperlipidemia: (LDL goal < 100) -Not ideally controlled - *** -Current treatment: Atorvastatin 20 mg daily -Medications previously tried: pravastatin, gemfibrozil -Educated on Cholesterol goals;  Benefits of statin for ASCVD risk reduction; Importance of limiting foods high in cholesterol; -Counseled on diet and exercise extensively Recommended to continue current medication  COPD (Goal: control symptoms and prevent exacerbations) -controlled -Current treatment  Spiriva Handihaler 8mg 1 puff daily Albuterol HFA prn -Gold Grade: Gold 1 (FEV1>80%) -Current COPD Classification:  A (low sx, <2 exacerbations/yr) -Pulmonary function testing: 2015 - FEV1 88% predicted, FEV/FVC 0.86 -Exacerbations requiring treatment in last 6 months: 1 -Patient reports consistent use of maintenance inhaler -Frequency of rescue inhaler use: rare -Counseled on Proper inhaler technique; Benefits of consistent maintenance inhaler use When to use rescue inhaler -Recommended to continue current medication  Depression/Anxiety/Insomnia (Goal: manage symptoms) -Uncontrolled - family stress and health concerns are causing her issues currently -Current treatment: Venlafaxine ER 150 mg - 2 cap daily Quetiapine 100 mg - 1.5 tab daily HS Clonazepam 1 mg BID prn -Medications previously tried/failed: zolpidem -PHQ9: 2 (04/2020) - sleeping -GAD: not on file -Connected with PCP for mental health support -Educated on Benefits of medication for symptom control  -Counseled on differences between sertraline and venlafaxine; pain mgmt doctor mentioned sertraline may be better for her, however pt is not willing to taper off of venlafaxine -Recommended to continue current medication  Chronic Pain (Goal: manage symptoms) -Controlled -osteoarthritis (knees), lumbar DDD, rotator cuff disorder -pain management: Dr FRoyce Macadamia-Current treatment  Fentanyl 37.5 mcg/72 hr patch Hydrocodone-APAP 10-325 mg q6h  PRN Tylenol 500 mg PRN Gabapentin 400 mg TID Tizanidine 4 mg q8h  PRN Ropinirole 0.5 mg QID prn -Patient is satisfied with current regimen and denies issues -Recommended to continue current medication  Tobacco use (Goal quit smoking) -Uncontrolled - pt not willing to quit right now due to life stressors -Current treatment  Nicotine patch 21 mg/25hr -Patient smokes 1.25 ppd x 40 years -Patient triggers include: stress - reports MOTIVATION to quit is moderate -reports CONFIDENCE in quitting is low  -Previous quit attempts: Chantix -Counseled on benefits of using nicotine patch + gum in combination to fight cravings  Patient Goals/Self-Care Activities Patient will:  - take medications as prescribed Keep upcoming appts with Cardiology and Pulmonology

## 2021-04-12 NOTE — Telephone Encounter (Signed)
Patient wanted to cancel appointment. She states she was feeling better but would not have a ride to her appointment on tomorrow. She said she found out her daughter was taking her pain medication and swapping it for Tylenol. She said she will call back and schedule if she needed to come in.

## 2021-04-16 ENCOUNTER — Ambulatory Visit: Payer: Medicare Other | Admitting: Internal Medicine

## 2021-04-16 DIAGNOSIS — N184 Chronic kidney disease, stage 4 (severe): Secondary | ICD-10-CM | POA: Diagnosis not present

## 2021-04-16 DIAGNOSIS — F17210 Nicotine dependence, cigarettes, uncomplicated: Secondary | ICD-10-CM | POA: Diagnosis not present

## 2021-04-16 DIAGNOSIS — Z79899 Other long term (current) drug therapy: Secondary | ICD-10-CM | POA: Diagnosis not present

## 2021-04-16 DIAGNOSIS — M545 Low back pain, unspecified: Secondary | ICD-10-CM | POA: Diagnosis not present

## 2021-04-16 DIAGNOSIS — J439 Emphysema, unspecified: Secondary | ICD-10-CM | POA: Diagnosis not present

## 2021-04-16 DIAGNOSIS — N3010 Interstitial cystitis (chronic) without hematuria: Secondary | ICD-10-CM | POA: Diagnosis not present

## 2021-04-18 DIAGNOSIS — Z79899 Other long term (current) drug therapy: Secondary | ICD-10-CM | POA: Diagnosis not present

## 2021-04-23 ENCOUNTER — Encounter (HOSPITAL_BASED_OUTPATIENT_CLINIC_OR_DEPARTMENT_OTHER): Payer: Self-pay

## 2021-04-23 ENCOUNTER — Other Ambulatory Visit: Payer: Self-pay

## 2021-04-23 DIAGNOSIS — R109 Unspecified abdominal pain: Secondary | ICD-10-CM | POA: Diagnosis not present

## 2021-04-23 DIAGNOSIS — K219 Gastro-esophageal reflux disease without esophagitis: Secondary | ICD-10-CM | POA: Diagnosis not present

## 2021-04-23 DIAGNOSIS — Z96651 Presence of right artificial knee joint: Secondary | ICD-10-CM | POA: Diagnosis not present

## 2021-04-23 DIAGNOSIS — Z79899 Other long term (current) drug therapy: Secondary | ICD-10-CM | POA: Diagnosis not present

## 2021-04-23 DIAGNOSIS — E1122 Type 2 diabetes mellitus with diabetic chronic kidney disease: Secondary | ICD-10-CM | POA: Diagnosis not present

## 2021-04-23 DIAGNOSIS — F17210 Nicotine dependence, cigarettes, uncomplicated: Secondary | ICD-10-CM | POA: Diagnosis not present

## 2021-04-23 DIAGNOSIS — J449 Chronic obstructive pulmonary disease, unspecified: Secondary | ICD-10-CM | POA: Insufficient documentation

## 2021-04-23 DIAGNOSIS — E039 Hypothyroidism, unspecified: Secondary | ICD-10-CM | POA: Insufficient documentation

## 2021-04-23 DIAGNOSIS — N183 Chronic kidney disease, stage 3 unspecified: Secondary | ICD-10-CM | POA: Insufficient documentation

## 2021-04-23 DIAGNOSIS — I129 Hypertensive chronic kidney disease with stage 1 through stage 4 chronic kidney disease, or unspecified chronic kidney disease: Secondary | ICD-10-CM | POA: Insufficient documentation

## 2021-04-23 DIAGNOSIS — Z96641 Presence of right artificial hip joint: Secondary | ICD-10-CM | POA: Insufficient documentation

## 2021-04-23 LAB — BASIC METABOLIC PANEL
Anion gap: 12 (ref 5–15)
BUN: 15 mg/dL (ref 8–23)
CO2: 26 mmol/L (ref 22–32)
Calcium: 9.4 mg/dL (ref 8.9–10.3)
Chloride: 104 mmol/L (ref 98–111)
Creatinine, Ser: 1.53 mg/dL — ABNORMAL HIGH (ref 0.44–1.00)
GFR, Estimated: 37 mL/min — ABNORMAL LOW (ref >60)
Glucose, Bld: 90 mg/dL (ref 70–99)
Potassium: 4.2 mmol/L (ref 3.5–5.1)
Sodium: 142 mmol/L (ref 135–145)

## 2021-04-23 LAB — CBC
HCT: 42.4 % (ref 36.0–46.0)
Hemoglobin: 13.7 g/dL (ref 12.0–15.0)
MCH: 31.6 pg (ref 26.0–34.0)
MCHC: 32.3 g/dL (ref 30.0–36.0)
MCV: 97.9 fL (ref 80.0–100.0)
Platelets: 274 10*3/uL (ref 150–400)
RBC: 4.33 MIL/uL (ref 3.87–5.11)
RDW: 14.7 % (ref 11.5–15.5)
WBC: 14.7 10*3/uL — ABNORMAL HIGH (ref 4.0–10.5)
nRBC: 0 % (ref 0.0–0.2)

## 2021-04-23 LAB — URINALYSIS, ROUTINE W REFLEX MICROSCOPIC
Bilirubin Urine: NEGATIVE
Glucose, UA: NEGATIVE mg/dL
Hgb urine dipstick: NEGATIVE
Ketones, ur: NEGATIVE mg/dL
Leukocytes,Ua: NEGATIVE
Nitrite: NEGATIVE
Protein, ur: NEGATIVE mg/dL
Specific Gravity, Urine: 1.012 (ref 1.005–1.030)
pH: 7 (ref 5.0–8.0)

## 2021-04-23 NOTE — ED Triage Notes (Signed)
In June was admitted to hospital with kidney failure and reports she has had issues since.  Reports bilatearl flank pain.  Patient already takes hydrocodone and fentanyl with no pain relief.

## 2021-04-24 ENCOUNTER — Telehealth: Payer: Self-pay | Admitting: Pharmacist

## 2021-04-24 ENCOUNTER — Emergency Department (HOSPITAL_BASED_OUTPATIENT_CLINIC_OR_DEPARTMENT_OTHER)
Admission: EM | Admit: 2021-04-24 | Discharge: 2021-04-24 | Disposition: A | Discharge status: Home / Self Care | Payer: Medicare Other | Attending: Emergency Medicine | Admitting: Emergency Medicine

## 2021-04-24 ENCOUNTER — Emergency Department (HOSPITAL_BASED_OUTPATIENT_CLINIC_OR_DEPARTMENT_OTHER): Payer: Medicare Other

## 2021-04-24 DIAGNOSIS — N200 Calculus of kidney: Secondary | ICD-10-CM | POA: Diagnosis not present

## 2021-04-24 DIAGNOSIS — R109 Unspecified abdominal pain: Secondary | ICD-10-CM

## 2021-04-24 DIAGNOSIS — K5900 Constipation, unspecified: Secondary | ICD-10-CM | POA: Diagnosis not present

## 2021-04-24 MED ORDER — DIPHENHYDRAMINE HCL 25 MG PO CAPS
25.0000 mg | ORAL_CAPSULE | Freq: Once | ORAL | Status: AC
  Administered 2021-04-24: 25 mg via ORAL
  Filled 2021-04-24: qty 1

## 2021-04-24 MED ORDER — MORPHINE SULFATE (PF) 4 MG/ML IV SOLN
4.0000 mg | Freq: Once | INTRAVENOUS | Status: AC
  Administered 2021-04-24: 4 mg via INTRAMUSCULAR
  Filled 2021-04-24: qty 1

## 2021-04-24 NOTE — ED Provider Notes (Signed)
[facility] EMERGENCY DEPT Provider Note   CSN: [id number] Arrival date & time: 04/23/21  2137     History Chief Complaint  Patient presents with   Flank Pain    Jill Phillips is a 65 y.o. female.  The history is provided by the patient.  Flank Pain This is a new problem. The current episode started 12 to 24 hours ago. The problem occurs constantly. The problem has been gradually worsening. Pertinent negatives include no chest pain, no abdominal pain and no shortness of breath. Nothing aggravates the symptoms. Nothing relieves the symptoms.  Patient with extensive history including chronic pain requiring daily fentanyl and Vicodin presents with flank pain. Patient reports she woke up with bilateral flank pain and she is concerned about her kidneys. She denies any fevers or vomiting.  No chest pain or shortness of breath.  No change in urine output, no dysuria.  No new lower extremity weakness.  She took Vicodin without relief    Past Medical History:  Diagnosis Date   Active smoker    Anxiety    Calcifying tendinitis of shoulder    Chronic back pain    Chronic pain syndrome    COPD (chronic obstructive pulmonary disease) (HCC)    Dysthymic disorder    Emphysema lung (HCC)    GERD (gastroesophageal reflux disease)    Headache    "weekly maybe" (01/31/2016)   Heart murmur    for years, nothing to be concerned about   Herpes genitalia    History of blood transfusion 1980   related to "back surgery"   Hyperlipidemia    Hypertension    Hypothyroidism    Lumbago    Osteoarthrosis, unspecified whether generalized or localized, lower leg    Pain in joint, upper arm    Pneumothorax, left 01/31/2016   S/P Left posterior subcostal pain injection on 01/30/2016   PONV (postoperative nausea and vomiting)    gets nauseous  with longer surgery. Difficuty voiding after surgery   Postlaminectomy syndrome, thoracic region    Primary localized osteoarthrosis, lower leg     Restless leg syndrome    Sleep apnea    s/p surgery- last sleep study 2011- doesnt use oxygen or machine at night as instructed,.   12/2014- Dr Halford Chessman  reports it is negative.   Thyroid disease     Patient Active Problem List   Diagnosis Date Noted   AKI (acute kidney injury) ([place]) 02/04/2021   Overdose 02/04/2021   Acute gout 06/20/2020   Interstitial cystitis 01/21/2020   Status post shoulder surgery 09/09/2019   Nausea 08/19/2019   Anxiety 05/04/2019   Insomnia 05/04/2019   Syncope 12/08/2018   Disorder of rotator cuff syndrome of left shoulder and allied disorder 06/22/2018   Bilateral leg edema 06/04/2018   Type 2 diabetes mellitus without complication, without long-term current use of insulin ([place]) 12/02/2017   Sacral pain 09/17/2017   COPD (chronic obstructive pulmonary disease) ([place]) 04/21/2017   Lower back pain 03/03/2017   CKD (chronic kidney disease) stage 3, GFR 30-59 ml/min (HCC) 08/07/2016   GERD (gastroesophageal reflux disease) 08/07/2016   Chronic respiratory failure ([place]) 07/29/2016   Arthritis of carpometacarpal (CMC) joint of left thumb 07/09/2016   Pneumothorax on left 01/31/2016   Chronic, continuous use of opioids 09/06/2015   Degenerative disc disease, lumbar 08/01/2015    Class: Chronic   Spondylolisthesis of lumbar region 08/01/2015    Class: Chronic   Urinary, incontinence, stress female 05/06/2014   Constipation  05/05/2014   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/14/2013   HTN (hypertension) 05/19/2013   HLD (hyperlipidemia) 05/19/2013   Depression 05/19/2013   Hypothyroidism 05/19/2013   Tobacco abuse    Osteoarthritis of both knees 11/18/2011   RLS (restless legs syndrome) 11/18/2011    Past Surgical History:  Procedure Laterality Date   APPENDECTOMY     BACK SURGERY     18 back surgeries (2 thoracic & 16 lumbar) (01/31/2016)   DILATION AND CURETTAGE OF UTERUS     HAMMER TOE SURGERY     IR RADIOLOGIST EVAL & MGMT  07/21/2017   JOINT REPLACEMENT     KNEE  ARTHROSCOPY Right    LAPAROSCOPIC CHOLECYSTECTOMY     LUMBAR FUSION N/A 08/01/2015   Procedure: Right sided L1-2 and L2-3 transforaminal lumbar interbody fusion with cages, Extension of posterior fusion T12 to L3, Replaced pedicle screws bilaterally L1-L2 , Replaced left sided pedicle screws L-3. Instrumentation T12 to L3 using local bone graft, Vivigen allograft and cancellous chips;  Surgeon: [name], MD;  Location: [facility];  Service: Orthopedics;  Laterality: N/A;   LUMBAR LAMINECTOMY/DECOMPRESSION MICRODISCECTOMY N/A 01/28/2014   Procedure: Minimally Invasive Right  L1-2 Microdiscectomy;  Surgeon: [name], MD;  Location: [facility];  Service: Orthopedics;  Laterality: N/A;   REVERSE SHOULDER ARTHROPLASTY Left 09/09/2019   Procedure: LEFT REVERSE SHOULDER REPLACEMENT;  Surgeon: Meredith Pel, MD;  Location: [facility];  Service: Orthopedics;  Laterality: Left;   TOTAL HIP ARTHROPLASTY Right    TOTAL KNEE ARTHROPLASTY  05/29/2012   Procedure: TOTAL KNEE ARTHROPLASTY;  Surgeon: Mcarthur Rossetti, MD;  Location: [facility];  Service: Orthopedics;  Laterality: Right;  Right Total Knee Arthroplasty   TUBAL LIGATION     UVULOPALATOPHARYNGOPLASTY     VAGINAL HYSTERECTOMY       OB History   No obstetric history on file.     Family History  Problem Relation Age of Onset   Kidney disease Mother    Heart disease Father    Anuerysm Brother 60       brain   Heart disease Brother    Heart disease Sister 37       s/p CABG   Hypertension Sister    Colon cancer Neg Hx     Social History   Tobacco Use   Smoking status: Every Day    Packs/day: 1.25    Years: 45.00    Pack years: 56.25    Types: Cigarettes   Smokeless tobacco: Never   Tobacco comments:    form given 12/25/16  Vaping Use   Vaping Use: Never used  Substance Use Topics   Alcohol use: No    Alcohol/week: 0.0 standard drinks   Drug use: No    Types: Marijuana    Comment: 01/31/2016 "none since ~ 1980"    Home  Medications Prior to Admission medications   Medication Sig Start Date End Date Taking? Authorizing Provider  amLODipine-benazepril (LOTREL) 10-40 MG capsule TAKE 1 CAPSULE BY MOUTH DAILY. 04/04/21   [name], MD  acetaminophen (TYLENOL) 500 MG tablet Take 1,000 mg by mouth every 6 (six) hours as needed (for headaches.).    [provider]  albuterol (PROAIR HFA) 108 (90 Base) MCG/ACT inhaler INHALE 2 PUFFS INTO THE LUNGS EVERY 6 HOURS AS NEEDED FOR WHEEZING OR SHORTNESS OF BREATH. Patient taking differently: Inhale 2 puffs into the lungs every 6 (six) hours as needed for wheezing or shortness of breath. 11/23/20   Billey Gosling  J, MD  atorvastatin (LIPITOR) 20 MG tablet TAKE 1 TABLET BY MOUTH DAILY. Patient taking differently: Take 20 mg by mouth daily. 02/02/21   [name], MD  cimetidine (TAGAMET) 400 MG tablet Take 400 mg by mouth in the morning. 08/24/19   [provider]  clonazePAM (KLONOPIN) 1 MG tablet TAKE 1 TABLET BY MOUTH 2 TIMES DAILY AS NEEDED FOR ANXIETY. 03/28/21   [name], MD  docusate sodium (COLACE) 100 MG capsule Take 300 mg by mouth at bedtime.    [provider]  fentaNYL (DURAGESIC) 12 MCG/HR 1 patch every 3 (three) days. 02/19/21   [provider]  gabapentin (NEURONTIN) 100 MG capsule Take 1 capsule (100 mg total) by mouth 2 (two) times daily. 02/06/21 05/07/21  Dessa Phi, DO  HYDROcodone-acetaminophen (NORCO) 10-325 MG tablet Take 1 tablet by mouth every 6 (six) hours as needed for severe pain (Chronic back pain). Patient taking differently: Take 1-4 tablets by mouth every 6 (six) hours as needed for severe pain (Chronic back pain). Patient takes medication BID 03/15/20   [name], MD  levothyroxine (SYNTHROID) 88 MCG tablet TAKE 1 TABLET BY MOUTH DAILY. 02/14/21   [name], MD  NARCAN 4 MG/0.1ML LIQD nasal spray kit Place 1 spray into the nose as needed (accidental overdose). 03/15/20   [name], MD  nebivolol  (BYSTOLIC) 5 MG tablet Take 2 tablets (10 mg total) by mouth daily. 04/10/21   [name], MD  omeprazole (PRILOSEC) 40 MG capsule Take 1 capsule (40 mg total) by mouth 2 (two) times daily. 02/12/21   [name], MD  QUEtiapine (SEROQUEL) 100 MG tablet TAKE 1 AND 1/2 TABLETS BY MOUTH AT BEDTIME. 02/07/21   Burns, Claudina Lick, MD  rOPINIRole (REQUIP) 1 MG tablet Take 1-2 mg by mouth 2 (two) times daily as needed. 03/01/21   [provider]  Sennosides (SENNA LAX PO) Take 2 tablets by mouth at bedtime. 05/20/17   [provider]  solifenacin (VESICARE) 10 MG tablet Take 10 mg by mouth daily. 01/12/21   [provider]  tiZANidine (ZANAFLEX) 4 MG tablet TAKE 1 TABLET BY MOUTH EVERY 8 HOURS AS NEEDED 03/05/21   [name], MD  valACYclovir (VALTREX) 500 MG tablet TAKE 1 TABLET (500 MG TOTAL) BY MOUTH DAILY. 12/21/20   [name], MD  venlafaxine XR (EFFEXOR-XR) 150 MG 24 hr capsule TAKE 2 CAPSULES BY MOUTH DAILY. **NEED OFFICE VISIT FOR REFILLS 03/19/21   [name], MD    Allergies    Flagyl [metronidazole], Limonene, Sulfa antibiotics, Doxycycline, Amoxicillin, Chlorzoxazone, Codeine, Darvocet [propoxyphene n-acetaminophen], Dilaudid [hydromorphone hcl], Keflex [cephalexin], Morphine and related, Nitrofurantoin monohyd macro, Percocet [oxycodone-acetaminophen], and Trazodone and nefazodone  Review of Systems   Review of Systems  Constitutional:  Negative for fever.  Respiratory:  Negative for shortness of breath.   Cardiovascular:  Negative for chest pain.  Gastrointestinal:  Negative for abdominal pain and vomiting.  Genitourinary:  Positive for flank pain. Negative for dysuria.  Neurological:  Negative for weakness.  All other systems reviewed and are negative.  Physical Exam Updated Vital Signs BP (!) 138/59 (BP Location: Right Arm)   Pulse 76   Temp 98.7 F (37.1 C) (Oral)   Resp 16   Ht 1.6 m (_0 )   Wt 70.3 kg   SpO2 100%   BMI 27.46 kg/m    Physical Exam CONSTITUTIONAL: Well developed/well nourished, no distress noted HEAD: Normocephalic/atraumatic EYES:  EOMI NECK: supple no meningeal signs SPINE/BACK:entire spine nontender well-healed surgical scar noted CV: S1/S2 noted, no murmurs/rubs/gallops noted LUNGS: Lungs are clear to auscultation bilaterally, no apparent distress Pulse ox above 94% on my entire evaluation ABDOMEN: soft, nontender, no rebound or guarding GY:BWLSLHTDS cva tenderness, no erythema/bruising/rash noted NEURO: Awake/alert,equal motor 5/5 strength noted with the following: hip flexion/knee flexion/extension, foot dorsi/plantar flexion, great toe extension intact bilaterally EXTREMITIES: pulses normal, full ROM SKIN: warm, color normal, fentanyl patch on back PSYCH: no abnormalities of mood noted, alert and oriented to situation  ED Results / Procedures / Treatments   Labs (all labs ordered are listed, but only abnormal results are displayed) Labs Reviewed  BASIC METABOLIC PANEL - Abnormal; Notable for the following components:      Result Value   Creatinine, Ser 1.53 (*)    GFR, Estimated 37 (*)    All other components within normal limits  CBC - Abnormal; Notable for the following components:   WBC 14.7 (*)    All other components within normal limits  URINALYSIS, ROUTINE W REFLEX MICROSCOPIC    EKG None  Radiology CT Renal Stone Study  Result Date: 04/24/2021 CLINICAL DATA:  Flank pain.  Concern for kidney stone. EXAM: CT ABDOMEN AND PELVIS WITHOUT CONTRAST TECHNIQUE: Multidetector CT imaging of the abdomen and pelvis was performed following the standard protocol without IV contrast. COMPARISON:  CT of the pelvis dated 09/04/2017. FINDINGS: Evaluation of this exam is limited in the absence of intravenous contrast. Lower chest: The visualized lung bases are clear. No intra-abdominal free air or free fluid. Hepatobiliary: The liver is unremarkable. No intrahepatic biliary dilatation.  Cholecystectomy. Pancreas: Unremarkable. No pancreatic ductal dilatation or surrounding inflammatory changes. Spleen: Normal in size without focal abnormality. Adrenals/Urinary Tract: The adrenal glands are unremarkable. There is no hydronephrosis or nephrolithiasis on either side. The visualized ureters appear unremarkable. The urinary bladder is grossly unremarkable as visualized. Stomach/Bowel: There is moderate stool throughout the colon. No bowel obstruction or active inflammation. Appendectomy. Vascular/Lymphatic: Moderate aortoiliac atherosclerotic disease. The IVC is unremarkable. No portal venous gas. There is no adenopathy. Reproductive: Hysterectomy.  No adnexal masses. Other: None Musculoskeletal: Total right hip arthroplasty. Lumbar laminectomy and posterior fusion hardware. Osteopenia and degenerative changes of the spine. No acute osseous pathology. IMPRESSION: 1. No acute intra-abdominal or pelvic pathology. No hydronephrosis or nephrolithiasis. 2. Moderate colonic stool burden. No bowel obstruction. 3. Aortic Atherosclerosis (ICD10-I70.0). Electronically Signed   By: Anner Crete M.D.   On: 04/24/2021 01:15    Procedures Procedures   Medications Ordered in ED Medications  morphine 4 MG/ML injection 4 mg (4 mg Intramuscular Given 04/24/21 0056)  diphenhydrAMINE (BENADRYL) capsule 25 mg (25 mg Oral Given 04/24/21 0056)    ED Course  I have reviewed the triage vital signs and the nursing notes.  Pertinent labs & imaging results that were available during my care of the patient were reviewed by me and considered in my medical decision making (see chart for details).    MDM Rules/Calculators/A&P                           Patient presents with bilateral flank pain.  Labs overall reassuring at her baseline.  She is requesting a "shot "for her pain. She reports she can tolerate morphine with Benadryl CT imaging pending   Patient resting comfortably watching television.  CT  imaging was negative for acute process.  We discussed CT findings, patient will  be discharged home. Final Clinical Impression(s) / ED Diagnoses Final diagnoses:  Flank pain    Rx / DC Orders ED Discharge Orders     None        Ripley Fraise, MD 04/24/21 [phone]

## 2021-04-24 NOTE — Progress Notes (Signed)
    Chronic Care Management Pharmacy Assistant   Name: Jill Phillips  MRN: [id number] DOB: [birth year]   Reason for Encounter: Disease State   Conditions to be addressed/monitored: General   Recent office visits:  03/09/21 [name], MD (PCP) Type 2 diabetes mellitus without complication, without long-term current use of insulin (Jill Phillips)  followup in 6 months  Recent consult visits:  04/04/21 Lanae Crumbly, PA-C (Low back pain, unspecified back pain laterality, unspecified chronicity, unspecified whether sciatica present) XR Knee 1-2 Views, LeftXR Lumbar Spine 2-3 Views, Large Joint Inj: L knee  Hospital visits:  Medication Reconciliation was completed by comparing discharge summary, patient's EMR and Pharmacy list, and upon discussion with patient.  Admitted to the hospital on 04/24/21 due to Flank pain. Discharge date was 04/24/21. Discharged from [facility] ED.    New?Medications Started at [facility] Discharge:?? -started morphine 4 mg/ml injection given  Benadryl 25 mg given  Medication Changes at Hospital Discharge: -Changed None ID  Medications Discontinued at Hospital Discharge: -Stopped None ID  Medications that remain the same after Hospital Discharge:??  -All other medications will remain the same.        [facility] on 04/24/21 for general disease state and medication adherence call.   Patient is not > 5 days past due for refill on the following medications per chart history:  [facility] Medications: Medication Name/mg Last Fill Days Supply Amlodipine/benazepril  04/04/21  90 Atorvastatin 20 mg  02/02/21  90  What concerns do you have about your medications? Patient states that Dr. Quay Burow increase the dose of Bystolic to 2 tabs daily. Patient also states that her daughter has taken almost 8 to 10 of her Seroquel tabs and replaced them with tylenol tabs. Patient states that she cannot sleep without those tablets, she only has enough for 4  days  The patient denies side effects with her medications.   How often do you forget or accidentally miss a dose? Rarely  Do you use a pillbox? Yes  Are you having any problems getting your medications from your pharmacy? Yes, sometimes they don't fill her meds on time  Has the cost of your medications been a concern? No, does not have to pay for any medications If yes, what medication and is patient assistance available or has it been applied for?  Since last visit with CPP, no interventions have been made:   The patient has had an ED visit since last contact.   The patient denies problems with their health.   She denies  concerns or questions for [employer], Pharm. D at this time.   Counseled patient on:  Importance of taking medication daily without missed doses   Care Gaps: Last annual wellness visit: If Diabetic: Last eye exam / retinopathy screening: Last diabetic foot exam:  PCP appointment on 05/01/21    [facility] Pharmacist Assistant [phone]   Time spent:40

## 2021-04-25 ENCOUNTER — Other Ambulatory Visit: Payer: Self-pay | Admitting: Internal Medicine

## 2021-04-25 NOTE — Telephone Encounter (Signed)
Per pt the pharmacy is unable to fill her nebivolol (BYSTOLIC) 5 MG tablet until the instructions are updated to two tablets a day. On our side it looks updated but can you resend it again to her pharmacy?   Pt is also is requesting refill of Spiriva Handihaler. Medication is not listed on med list.   Last OV: 03/09/21/ next OV 05/01/21  [facility] - [place], [place] Phone:  [phone]  Fax:  [phone]

## 2021-04-26 ENCOUNTER — Other Ambulatory Visit: Payer: Self-pay | Admitting: Internal Medicine

## 2021-04-26 ENCOUNTER — Telehealth: Payer: Self-pay

## 2021-04-26 MED ORDER — SPIRIVA HANDIHALER 18 MCG IN CAPS: 18.0000 ug | ORAL_CAPSULE | Freq: Every day | RESPIRATORY_TRACT | 12 refills | Status: DC

## 2021-04-26 MED ORDER — NEBIVOLOL HCL 10 MG PO TABS: 10.0000 mg | ORAL_TABLET | Freq: Every day | ORAL | 3 refills | Status: DC

## 2021-04-26 NOTE — Telephone Encounter (Signed)
Patient was seen at the [facility]. She believes she has an infection where they had drew blood. Patient wondering if she should be seen again on does something need to be called in? Has a temp of 99.

## 2021-04-26 NOTE — Telephone Encounter (Signed)
Patient scheduled for tomorrow

## 2021-04-26 NOTE — Progress Notes (Deleted)
Subjective:    Patient ID: Jill Phillips, female    DOB: 05-08-1956, 65 y.o.   MRN: [id number]  HPI The patient is here for an acute visit.   ED 8/23 for flank pain.  WBC elevated. BMP stable.  UA neg.  Ct renal neg.   Medications and allergies reviewed with patient and updated if appropriate.  Patient Active Problem List   Diagnosis Date Noted   AKI (acute kidney injury) ([place]) 02/04/2021   Overdose 02/04/2021   Acute gout 06/20/2020   Interstitial cystitis 01/21/2020   Status post shoulder surgery 09/09/2019   Nausea 08/19/2019   Anxiety 05/04/2019   Insomnia 05/04/2019   Syncope 12/08/2018   Disorder of rotator cuff syndrome of left shoulder and allied disorder 06/22/2018   Bilateral leg edema 06/04/2018   Type 2 diabetes mellitus without complication, without long-term current use of insulin ([facility]) 12/02/2017   Sacral pain 09/17/2017   COPD (chronic obstructive pulmonary disease) ([place]) 04/21/2017   Lower back pain 03/03/2017   CKD (chronic kidney disease) stage 3, GFR 30-59 ml/min (HCC) 08/07/2016   GERD (gastroesophageal reflux disease) 08/07/2016   Chronic respiratory failure ([place]) 07/29/2016   Arthritis of carpometacarpal (CMC) joint of left thumb 07/09/2016   Pneumothorax on left 01/31/2016   Chronic, continuous use of opioids 09/06/2015   Degenerative disc disease, lumbar 08/01/2015   Spondylolisthesis of lumbar region 08/01/2015   Urinary, incontinence, stress female 05/06/2014   Constipation 05/05/2014   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/14/2013   HTN (hypertension) 05/19/2013   HLD (hyperlipidemia) 05/19/2013   Depression 05/19/2013   Hypothyroidism 05/19/2013   Tobacco abuse    Osteoarthritis of both knees 11/18/2011   RLS (restless legs syndrome) 11/18/2011    Current Outpatient Medications on File Prior to Visit  Medication Sig Dispense Refill   amLODipine-benazepril (LOTREL) 10-40 MG capsule TAKE 1 CAPSULE BY MOUTH DAILY. 90 capsule 1   acetaminophen (TYLENOL) 500  MG tablet Take 1,000 mg by mouth every 6 (six) hours as needed (for headaches.).     albuterol (PROAIR HFA) 108 (90 Base) MCG/ACT inhaler INHALE 2 PUFFS INTO THE LUNGS EVERY 6 HOURS AS NEEDED FOR WHEEZING OR SHORTNESS OF BREATH. (Patient taking differently: Inhale 2 puffs into the lungs every 6 (six) hours as needed for wheezing or shortness of breath.) 8.5 g 0   atorvastatin (LIPITOR) 20 MG tablet TAKE 1 TABLET BY MOUTH DAILY. (Patient taking differently: Take 20 mg by mouth daily.) 90 tablet 1   cimetidine (TAGAMET) 400 MG tablet Take 400 mg by mouth in the morning.     clonazePAM (KLONOPIN) 1 MG tablet TAKE 1 TABLET BY MOUTH 2 TIMES DAILY AS NEEDED FOR ANXIETY. 60 tablet 0   docusate sodium (COLACE) 100 MG capsule Take 300 mg by mouth at bedtime.     fentaNYL (DURAGESIC) 12 MCG/HR 1 patch every 3 (three) days.     gabapentin (NEURONTIN) 100 MG capsule Take 1 capsule (100 mg total) by mouth 2 (two) times daily. 60 capsule 2   HYDROcodone-acetaminophen (NORCO) 10-325 MG tablet Take 1 tablet by mouth every 6 (six) hours as needed for severe pain (Chronic back pain). (Patient taking differently: Take 1-4 tablets by mouth every 6 (six) hours as needed for severe pain (Chronic back pain). Patient takes medication BID) 84 tablet 0   levothyroxine (SYNTHROID) 88 MCG tablet TAKE 1 TABLET BY MOUTH DAILY. 90 tablet 1   NARCAN 4 MG/0.1ML LIQD nasal spray kit Place 1 spray into the nose as  needed (accidental overdose). 1 each 0   nebivolol (BYSTOLIC) 10 MG tablet Take 1 tablet (10 mg total) by mouth daily. 90 tablet 3   omeprazole (PRILOSEC) 40 MG capsule Take 1 capsule (40 mg total) by mouth 2 (two) times daily. 180 capsule 1   QUEtiapine (SEROQUEL) 100 MG tablet TAKE 1 AND 1/2 TABLETS BY MOUTH AT BEDTIME. 135 tablet 1   rOPINIRole (REQUIP) 1 MG tablet Take 1-2 mg by mouth 2 (two) times daily as needed.     Sennosides (SENNA LAX PO) Take 2 tablets by mouth at bedtime.     solifenacin (VESICARE) 10 MG tablet  Take 10 mg by mouth daily.     tiotropium (SPIRIVA HANDIHALER) 18 MCG inhalation capsule Place 1 capsule (18 mcg total) into inhaler and inhale daily. 30 capsule 12   tiZANidine (ZANAFLEX) 4 MG tablet TAKE 1 TABLET BY MOUTH EVERY 8 HOURS AS NEEDED 90 tablet 2   valACYclovir (VALTREX) 500 MG tablet TAKE 1 TABLET (500 MG TOTAL) BY MOUTH DAILY. 90 tablet 1   venlafaxine XR (EFFEXOR-XR) 150 MG 24 hr capsule TAKE 2 CAPSULES BY MOUTH DAILY. ***NEED OFFICE VISIT FOR REFILLS*** 90 capsule 0   No current facility-administered medications on file prior to visit.    Past Medical History:  Diagnosis Date   Active smoker    Anxiety    Calcifying tendinitis of shoulder    Chronic back pain    Chronic pain syndrome    COPD (chronic obstructive pulmonary disease) (HCC)    Dysthymic disorder    Emphysema lung (HCC)    GERD (gastroesophageal reflux disease)    Headache    "weekly maybe" (01/31/2016)   Heart murmur    for years, nothing to be concerned about   Herpes genitalia    History of blood transfusion 1980   related to "back surgery"   Hyperlipidemia    Hypertension    Hypothyroidism    Lumbago    Osteoarthrosis, unspecified whether generalized or localized, lower leg    Pain in joint, upper arm    Pneumothorax, left 01/31/2016   S/P Left posterior subcostal pain injection on 01/30/2016   PONV (postoperative nausea and vomiting)    gets nauseous  with longer surgery. Difficuty voiding after surgery   Postlaminectomy syndrome, thoracic region    Primary localized osteoarthrosis, lower leg    Restless leg syndrome    Sleep apnea    s/p surgery- last sleep study 2011- doesnt use oxygen or machine at night as instructed,.   12/2014- Dr Halford Chessman  reports it is negative.   Thyroid disease     Past Surgical History:  Procedure Laterality Date   APPENDECTOMY     BACK SURGERY     18 back surgeries (2 thoracic & 16 lumbar) (01/31/2016)   DILATION AND CURETTAGE OF UTERUS     HAMMER TOE SURGERY      IR RADIOLOGIST EVAL & MGMT  07/21/2017   JOINT REPLACEMENT     KNEE ARTHROSCOPY Right    LAPAROSCOPIC CHOLECYSTECTOMY     LUMBAR FUSION N/A 08/01/2015   Procedure: Right sided L1-2 and L2-3 transforaminal lumbar interbody fusion with cages, Extension of posterior fusion T12 to L3, Replaced pedicle screws bilaterally L1-L2 , Replaced left sided pedicle screws L-3. Instrumentation T12 to L3 using local bone graft, Vivigen allograft and cancellous chips;  Surgeon: [name], MD;  Location: [facility];  Service: Orthopedics;  Laterality: N/A;   LUMBAR LAMINECTOMY/DECOMPRESSION MICRODISCECTOMY N/A 01/28/2014  Procedure: Minimally Invasive Right  L1-2 Microdiscectomy;  Surgeon: [name], MD;  Location: [facility];  Service: Orthopedics;  Laterality: N/A;   REVERSE SHOULDER ARTHROPLASTY Left 09/09/2019   Procedure: LEFT REVERSE SHOULDER REPLACEMENT;  Surgeon: Meredith Pel, MD;  Location: [facility];  Service: Orthopedics;  Laterality: Left;   TOTAL HIP ARTHROPLASTY Right    TOTAL KNEE ARTHROPLASTY  05/29/2012   Procedure: TOTAL KNEE ARTHROPLASTY;  Surgeon: Mcarthur Rossetti, MD;  Location[facility];  Service: Orthopedics;  Laterality: Right;  Right Total Knee Arthroplasty   TUBAL LIGATION     UVULOPALATOPHARYNGOPLASTY     VAGINAL HYSTERECTOMY      Social History   Socioeconomic History   Marital status: Divorced    Spouse name: n/a   Number of children: 2   Years of education: 12+   Highest education level: Not on file  Occupational History   Occupation: disability    Comment: back surgeries  Tobacco Use   Smoking status: Every Day    Packs/day: 1.25    Years: 45.00    Pack years: 56.25    Types: Cigarettes   Smokeless tobacco: Never   Tobacco comments:    form given 12/25/16  Vaping Use   Vaping Use: Never used  Substance and Sexual Activity   Alcohol use: No    Alcohol/week: 0.0 standard drinks   Drug use: No    Types: Marijuana    Comment: 01/31/2016 "none since ~ 1980"    Sexual activity: Never    Partners: Male  Other Topics Concern   Not on file  Social History Narrative   Lives alone.  One daughter is local, but is getting ready to move to [place], where her children live with their father.  The other daughter lives near [place], [place].   Social Determinants of Health   Financial Resource Strain: Low Risk    Difficulty of Paying Living Expenses: Not very hard  Food Insecurity: Not on file  Transportation Needs: Not on file  Physical Activity: Not on file  Stress: Not on file  Social Connections: Not on file    Family History  Problem Relation Age of Onset   Kidney disease Mother    Heart disease Father    Anuerysm Brother 51       brain   Heart disease Brother    Heart disease Sister 20       s/p CABG   Hypertension Sister    Colon cancer Neg Hx     Review of Systems     Objective:  There were no vitals filed for this visit. BP Readings from Last 3 Encounters:  04/24/21 (!) 138/59  03/09/21 (!) 148/82  02/12/21 140/80   Wt Readings from Last 3 Encounters:  04/23/21 155 lb (70.3 kg)  03/09/21 152 lb 12.8 oz (69.3 kg)  02/12/21 156 lb 6.4 oz (70.9 kg)   There is no height or weight on file to calculate BMI.   Physical Exam         Assessment & Plan:    See Problem List for Assessment and Plan of chronic medical problems.    This visit occurred during the SARS-CoV-2 public health emergency.  Safety protocols were in place, including screening questions prior to the visit, additional usage of staff PPE, and extensive cleaning of exam room while observing appropriate contact time as indicated for disinfecting solutions.

## 2021-04-27 ENCOUNTER — Ambulatory Visit: Payer: Medicare Other | Admitting: Internal Medicine

## 2021-05-01 ENCOUNTER — Ambulatory Visit: Payer: Medicare Other | Admitting: Internal Medicine

## 2021-05-02 DIAGNOSIS — N3010 Interstitial cystitis (chronic) without hematuria: Secondary | ICD-10-CM | POA: Diagnosis not present

## 2021-05-03 ENCOUNTER — Other Ambulatory Visit: Payer: Self-pay | Admitting: Internal Medicine

## 2021-05-08 ENCOUNTER — Other Ambulatory Visit: Payer: Self-pay | Admitting: Internal Medicine

## 2021-05-16 ENCOUNTER — Ambulatory Visit: Payer: Medicare Other | Admitting: Specialist

## 2021-05-16 DIAGNOSIS — Z79899 Other long term (current) drug therapy: Secondary | ICD-10-CM | POA: Diagnosis not present

## 2021-05-16 DIAGNOSIS — R1011 Right upper quadrant pain: Secondary | ICD-10-CM | POA: Diagnosis not present

## 2021-05-16 DIAGNOSIS — I10 Essential (primary) hypertension: Secondary | ICD-10-CM | POA: Diagnosis not present

## 2021-05-16 DIAGNOSIS — M545 Low back pain, unspecified: Secondary | ICD-10-CM | POA: Diagnosis not present

## 2021-05-21 DIAGNOSIS — Z79899 Other long term (current) drug therapy: Secondary | ICD-10-CM | POA: Diagnosis not present

## 2021-05-23 DIAGNOSIS — R1011 Right upper quadrant pain: Secondary | ICD-10-CM | POA: Diagnosis not present

## 2021-05-25 ENCOUNTER — Other Ambulatory Visit: Payer: Self-pay | Admitting: Internal Medicine

## 2021-05-28 ENCOUNTER — Other Ambulatory Visit: Payer: Self-pay | Admitting: Specialist

## 2021-06-01 ENCOUNTER — Ambulatory Visit: Payer: Medicare Other | Admitting: Nurse Practitioner

## 2021-06-04 ENCOUNTER — Encounter: Payer: Self-pay | Admitting: Specialist

## 2021-06-04 ENCOUNTER — Ambulatory Visit (INDEPENDENT_AMBULATORY_CARE_PROVIDER_SITE_OTHER): Payer: Medicare Other | Admitting: Specialist

## 2021-06-04 ENCOUNTER — Other Ambulatory Visit: Payer: Self-pay

## 2021-06-04 VITALS — BP 130/79 | HR 74 | Ht 63.0 in | Wt 153.0 lb

## 2021-06-04 DIAGNOSIS — K6289 Other specified diseases of anus and rectum: Secondary | ICD-10-CM

## 2021-06-04 DIAGNOSIS — M4325 Fusion of spine, thoracolumbar region: Secondary | ICD-10-CM

## 2021-06-04 DIAGNOSIS — M960 Pseudarthrosis after fusion or arthrodesis: Secondary | ICD-10-CM | POA: Diagnosis not present

## 2021-06-04 DIAGNOSIS — M545 Low back pain, unspecified: Secondary | ICD-10-CM

## 2021-06-04 DIAGNOSIS — G8929 Other chronic pain: Secondary | ICD-10-CM

## 2021-06-04 NOTE — Patient Instructions (Addendum)
Avoid frequent bending and stooping  No lifting greater than 10 lbs. May use ice or moist heat for pain. Weight loss is of benefit. Best medication for lumbar disc disease is arthritis medications like motrin, celebrex and naprosyn. Exercise is important to improve your indurance and does allow people to function better inspite of back pain.   

## 2021-06-08 ENCOUNTER — Encounter: Payer: Self-pay | Admitting: Surgery

## 2021-06-08 MED ORDER — LIDOCAINE HCL 1 % IJ SOLN
3.0000 mL | INTRAMUSCULAR | Status: AC | PRN
  Administered 2021-04-04: 3 mL

## 2021-06-08 MED ORDER — METHYLPREDNISOLONE ACETATE 40 MG/ML IJ SUSP
40.0000 mg | INTRAMUSCULAR | Status: AC | PRN
  Administered 2021-04-04: 40 mg via INTRA_ARTICULAR

## 2021-06-08 MED ORDER — BUPIVACAINE HCL 0.25 % IJ SOLN
6.0000 mL | INTRAMUSCULAR | Status: AC | PRN
  Administered 2021-04-04: 6 mL via INTRA_ARTICULAR

## 2021-06-13 ENCOUNTER — Ambulatory Visit (HOSPITAL_COMMUNITY)
Admission: RE | Admit: 2021-06-13 | Discharge: 2021-06-13 | Disposition: A | Discharge status: Home / Self Care | Payer: Medicare Other | Source: Ambulatory Visit | Attending: Specialist | Admitting: Specialist

## 2021-06-13 ENCOUNTER — Other Ambulatory Visit: Payer: Self-pay | Admitting: Specialist

## 2021-06-13 ENCOUNTER — Encounter (HOSPITAL_COMMUNITY)
Admission: RE | Admit: 2021-06-13 | Discharge: 2021-06-13 | Disposition: A | Discharge status: Home / Self Care | Payer: Medicare Other | Source: Ambulatory Visit | Attending: Specialist | Admitting: Specialist

## 2021-06-13 ENCOUNTER — Encounter (HOSPITAL_COMMUNITY): Payer: Self-pay

## 2021-06-13 ENCOUNTER — Other Ambulatory Visit: Payer: Self-pay

## 2021-06-13 ENCOUNTER — Encounter (HOSPITAL_COMMUNITY): Payer: Medicare Other

## 2021-06-13 DIAGNOSIS — M960 Pseudarthrosis after fusion or arthrodesis: Secondary | ICD-10-CM

## 2021-06-13 DIAGNOSIS — M545 Low back pain, unspecified: Secondary | ICD-10-CM

## 2021-06-13 DIAGNOSIS — M546 Pain in thoracic spine: Secondary | ICD-10-CM | POA: Diagnosis not present

## 2021-06-13 DIAGNOSIS — Z981 Arthrodesis status: Secondary | ICD-10-CM | POA: Diagnosis not present

## 2021-06-13 MED ORDER — TECHNETIUM TC 99M MEDRONATE IV KIT
20.6000 | PACK | Freq: Once | INTRAVENOUS | Status: AC
  Administered 2021-06-13: 20.6 via INTRAVENOUS

## 2021-06-13 NOTE — Addendum Note (Signed)
Encounter addended by: Rondel Baton, RT on: 06/13/2021 8:51 AM  Actions taken: Imaging Exam begun

## 2021-06-14 ENCOUNTER — Other Ambulatory Visit: Payer: Self-pay | Admitting: Internal Medicine

## 2021-06-15 ENCOUNTER — Other Ambulatory Visit: Payer: Self-pay | Admitting: Internal Medicine

## 2021-06-15 DIAGNOSIS — M545 Low back pain, unspecified: Secondary | ICD-10-CM | POA: Diagnosis not present

## 2021-06-15 DIAGNOSIS — Z79899 Other long term (current) drug therapy: Secondary | ICD-10-CM | POA: Diagnosis not present

## 2021-06-15 DIAGNOSIS — Z0000 Encounter for general adult medical examination without abnormal findings: Secondary | ICD-10-CM | POA: Diagnosis not present

## 2021-06-19 DIAGNOSIS — Z79899 Other long term (current) drug therapy: Secondary | ICD-10-CM | POA: Diagnosis not present

## 2021-06-22 ENCOUNTER — Telehealth: Payer: Self-pay | Admitting: Internal Medicine

## 2021-06-22 NOTE — Telephone Encounter (Signed)
Left message today for patient that she needs to be seen and evaluated.  If she calls back please tell her to be evaluated and she can visit a local urgent care.

## 2021-06-22 NOTE — Telephone Encounter (Signed)
Patient calling to state she is not urinating enough  Patient states legs and feet are swollen  Patient declined appointment  Patient is requesting a call back for advise

## 2021-06-22 NOTE — Telephone Encounter (Signed)
Needs to be evaluated in person °

## 2021-06-23 ENCOUNTER — Other Ambulatory Visit: Payer: Self-pay

## 2021-06-23 ENCOUNTER — Emergency Department (HOSPITAL_COMMUNITY): Payer: Medicare Other

## 2021-06-23 ENCOUNTER — Observation Stay (HOSPITAL_COMMUNITY)
Admission: EM | Admit: 2021-06-23 | Discharge: 2021-06-24 | Disposition: A | Discharge status: Home / Self Care | Payer: Medicare Other | Attending: Internal Medicine | Admitting: Internal Medicine

## 2021-06-23 DIAGNOSIS — Z20822 Contact with and (suspected) exposure to covid-19: Secondary | ICD-10-CM | POA: Diagnosis not present

## 2021-06-23 DIAGNOSIS — R0602 Shortness of breath: Secondary | ICD-10-CM | POA: Diagnosis not present

## 2021-06-23 DIAGNOSIS — E039 Hypothyroidism, unspecified: Secondary | ICD-10-CM | POA: Insufficient documentation

## 2021-06-23 DIAGNOSIS — F17210 Nicotine dependence, cigarettes, uncomplicated: Secondary | ICD-10-CM | POA: Insufficient documentation

## 2021-06-23 DIAGNOSIS — Z96641 Presence of right artificial hip joint: Secondary | ICD-10-CM | POA: Insufficient documentation

## 2021-06-23 DIAGNOSIS — Z79899 Other long term (current) drug therapy: Secondary | ICD-10-CM | POA: Diagnosis not present

## 2021-06-23 DIAGNOSIS — J449 Chronic obstructive pulmonary disease, unspecified: Secondary | ICD-10-CM | POA: Insufficient documentation

## 2021-06-23 DIAGNOSIS — R7989 Other specified abnormal findings of blood chemistry: Secondary | ICD-10-CM | POA: Diagnosis not present

## 2021-06-23 DIAGNOSIS — Z96651 Presence of right artificial knee joint: Secondary | ICD-10-CM | POA: Diagnosis not present

## 2021-06-23 DIAGNOSIS — R0902 Hypoxemia: Principal | ICD-10-CM

## 2021-06-23 DIAGNOSIS — I2510 Atherosclerotic heart disease of native coronary artery without angina pectoris: Secondary | ICD-10-CM | POA: Diagnosis not present

## 2021-06-23 DIAGNOSIS — E119 Type 2 diabetes mellitus without complications: Secondary | ICD-10-CM | POA: Diagnosis not present

## 2021-06-23 DIAGNOSIS — J929 Pleural plaque without asbestos: Secondary | ICD-10-CM | POA: Diagnosis not present

## 2021-06-23 DIAGNOSIS — J439 Emphysema, unspecified: Secondary | ICD-10-CM | POA: Diagnosis not present

## 2021-06-23 DIAGNOSIS — I10 Essential (primary) hypertension: Secondary | ICD-10-CM | POA: Insufficient documentation

## 2021-06-23 DIAGNOSIS — Z96612 Presence of left artificial shoulder joint: Secondary | ICD-10-CM | POA: Diagnosis not present

## 2021-06-23 DIAGNOSIS — J432 Centrilobular emphysema: Secondary | ICD-10-CM | POA: Diagnosis not present

## 2021-06-23 DIAGNOSIS — M7989 Other specified soft tissue disorders: Secondary | ICD-10-CM | POA: Diagnosis present

## 2021-06-23 LAB — BASIC METABOLIC PANEL
Anion gap: 10 (ref 5–15)
BUN: 11 mg/dL (ref 8–23)
CO2: 24 mmol/L (ref 22–32)
Calcium: 8.8 mg/dL — ABNORMAL LOW (ref 8.9–10.3)
Chloride: 108 mmol/L (ref 98–111)
Creatinine, Ser: 1.12 mg/dL — ABNORMAL HIGH (ref 0.44–1.00)
GFR, Estimated: 54 mL/min — ABNORMAL LOW (ref >60)
Glucose, Bld: 90 mg/dL (ref 70–99)
Potassium: 4.3 mmol/L (ref 3.5–5.1)
Sodium: 142 mmol/L (ref 135–145)

## 2021-06-23 LAB — HEPATIC FUNCTION PANEL
ALT: 10 U/L (ref 0–44)
AST: 14 U/L — ABNORMAL LOW (ref 15–41)
Albumin: 3.5 g/dL (ref 3.5–5.0)
Alkaline Phosphatase: 119 U/L (ref 38–126)
Bilirubin, Direct: <0.1 mg/dL (ref 0.0–0.2)
Total Bilirubin: 0.5 mg/dL (ref 0.3–1.2)
Total Protein: 6.4 g/dL — ABNORMAL LOW (ref 6.5–8.1)

## 2021-06-23 LAB — CBC
HCT: 42.3 % (ref 36.0–46.0)
Hemoglobin: 12.9 g/dL (ref 12.0–15.0)
MCH: 31.1 pg (ref 26.0–34.0)
MCHC: 30.5 g/dL (ref 30.0–36.0)
MCV: 101.9 fL — ABNORMAL HIGH (ref 80.0–100.0)
Platelets: 245 10*3/uL (ref 150–400)
RBC: 4.15 MIL/uL (ref 3.87–5.11)
RDW: 14.2 % (ref 11.5–15.5)
WBC: 8.5 10*3/uL (ref 4.0–10.5)
nRBC: 0 % (ref 0.0–0.2)

## 2021-06-23 LAB — D-DIMER, QUANTITATIVE: D-Dimer, Quant: 0.98 ug/mL-FEU — ABNORMAL HIGH (ref 0.00–0.50)

## 2021-06-23 LAB — BRAIN NATRIURETIC PEPTIDE: B Natriuretic Peptide: 159.3 pg/mL — ABNORMAL HIGH (ref 0.0–100.0)

## 2021-06-23 MED ORDER — ROPINIROLE HCL 1 MG PO TABS
1.0000 mg | ORAL_TABLET | Freq: Two times a day (BID) | ORAL | Status: DC | PRN
  Administered 2021-06-23 – 2021-06-24 (×2): 2 mg via ORAL
  Filled 2021-06-23 (×2): qty 2

## 2021-06-23 MED ORDER — LEVOTHYROXINE SODIUM 88 MCG PO TABS
88.0000 ug | ORAL_TABLET | Freq: Every day | ORAL | Status: DC
  Administered 2021-06-23 – 2021-06-24 (×2): 88 ug via ORAL
  Filled 2021-06-23 (×4): qty 1

## 2021-06-23 MED ORDER — DARIFENACIN HYDROBROMIDE ER 7.5 MG PO TB24
7.5000 mg | ORAL_TABLET | Freq: Every day | ORAL | Status: DC
  Administered 2021-06-23 – 2021-06-24 (×2): 7.5 mg via ORAL
  Filled 2021-06-23 (×2): qty 1

## 2021-06-23 MED ORDER — TIZANIDINE HCL 4 MG PO TABS
4.0000 mg | ORAL_TABLET | Freq: Three times a day (TID) | ORAL | Status: DC | PRN
  Administered 2021-06-24: 4 mg via ORAL
  Filled 2021-06-23 (×2): qty 1

## 2021-06-23 MED ORDER — DOCUSATE SODIUM 100 MG PO CAPS
300.0000 mg | ORAL_CAPSULE | Freq: Every day | ORAL | Status: DC
  Administered 2021-06-23: 300 mg via ORAL
  Filled 2021-06-23: qty 3

## 2021-06-23 MED ORDER — VENLAFAXINE HCL ER 150 MG PO CP24
300.0000 mg | ORAL_CAPSULE | Freq: Every day | ORAL | Status: DC
  Administered 2021-06-24: 300 mg via ORAL
  Filled 2021-06-23: qty 2
  Filled 2021-06-23: qty 4

## 2021-06-23 MED ORDER — ALBUTEROL SULFATE (2.5 MG/3ML) 0.083% IN NEBU: 2.5000 mg | INHALATION_SOLUTION | RESPIRATORY_TRACT | 2 refills | Status: DC | PRN

## 2021-06-23 MED ORDER — HYDROCODONE-ACETAMINOPHEN 10-325 MG PO TABS
1.0000 | ORAL_TABLET | Freq: Four times a day (QID) | ORAL | Status: DC | PRN
  Administered 2021-06-24: 1 via ORAL
  Filled 2021-06-23: qty 1

## 2021-06-23 MED ORDER — TIOTROPIUM BROMIDE MONOHYDRATE 18 MCG IN CAPS: 18.0000 ug | ORAL_CAPSULE | Freq: Every day | RESPIRATORY_TRACT | Status: DC

## 2021-06-23 MED ORDER — ALBUTEROL SULFATE (2.5 MG/3ML) 0.083% IN NEBU: 2.5000 mg | INHALATION_SOLUTION | Freq: Four times a day (QID) | RESPIRATORY_TRACT | Status: DC | PRN

## 2021-06-23 MED ORDER — GABAPENTIN 100 MG PO CAPS
100.0000 mg | ORAL_CAPSULE | Freq: Two times a day (BID) | ORAL | Status: DC
  Administered 2021-06-23 – 2021-06-24 (×2): 100 mg via ORAL
  Filled 2021-06-23 (×2): qty 1

## 2021-06-23 MED ORDER — CLONAZEPAM 0.5 MG PO TABS
1.0000 mg | ORAL_TABLET | Freq: Two times a day (BID) | ORAL | Status: DC | PRN
  Administered 2021-06-23: 1 mg via ORAL
  Filled 2021-06-23: qty 2

## 2021-06-23 MED ORDER — BENAZEPRIL HCL 40 MG PO TABS
40.0000 mg | ORAL_TABLET | Freq: Every day | ORAL | Status: DC
  Administered 2021-06-24: 40 mg via ORAL
  Filled 2021-06-23 (×2): qty 1

## 2021-06-23 MED ORDER — AMLODIPINE BESYLATE 10 MG PO TABS
10.0000 mg | ORAL_TABLET | Freq: Every day | ORAL | Status: DC
  Administered 2021-06-23 – 2021-06-24 (×2): 10 mg via ORAL
  Filled 2021-06-23 (×2): qty 1

## 2021-06-23 MED ORDER — PANTOPRAZOLE SODIUM 40 MG PO TBEC
40.0000 mg | DELAYED_RELEASE_TABLET | Freq: Every day | ORAL | Status: DC
  Administered 2021-06-23 – 2021-06-24 (×2): 40 mg via ORAL
  Filled 2021-06-23 (×2): qty 1

## 2021-06-23 MED ORDER — NEBIVOLOL HCL 5 MG PO TABS
10.0000 mg | ORAL_TABLET | Freq: Every day | ORAL | Status: DC
  Administered 2021-06-23 – 2021-06-24 (×2): 10 mg via ORAL
  Filled 2021-06-23 (×2): qty 2
  Filled 2021-06-23: qty 1

## 2021-06-23 MED ORDER — IOHEXOL 350 MG/ML SOLN
80.0000 mL | Freq: Once | INTRAVENOUS | Status: AC | PRN
  Administered 2021-06-23: 80 mL via INTRAVENOUS

## 2021-06-23 MED ORDER — QUETIAPINE FUMARATE 50 MG PO TABS
150.0000 mg | ORAL_TABLET | Freq: Every day | ORAL | Status: DC
  Administered 2021-06-23: 150 mg via ORAL
  Filled 2021-06-23: qty 1

## 2021-06-23 MED ORDER — UMECLIDINIUM BROMIDE 62.5 MCG/ACT IN AEPB
1.0000 | INHALATION_SPRAY | Freq: Every day | RESPIRATORY_TRACT | Status: DC
  Administered 2021-06-24: 1 via RESPIRATORY_TRACT
  Filled 2021-06-23: qty 7

## 2021-06-23 MED ORDER — NICOTINE 14 MG/24HR TD PT24
14.0000 mg | MEDICATED_PATCH | Freq: Once | TRANSDERMAL | Status: AC
  Administered 2021-06-23: 14 mg via TRANSDERMAL
  Filled 2021-06-23: qty 1

## 2021-06-23 MED ORDER — ATORVASTATIN CALCIUM 10 MG PO TABS
20.0000 mg | ORAL_TABLET | Freq: Every day | ORAL | Status: DC
  Administered 2021-06-23 – 2021-06-24 (×2): 20 mg via ORAL
  Filled 2021-06-23 (×2): qty 2

## 2021-06-23 MED ORDER — HYDROCODONE-ACETAMINOPHEN 5-325 MG PO TABS
2.0000 | ORAL_TABLET | Freq: Once | ORAL | Status: AC
  Administered 2021-06-23: 2 via ORAL
  Filled 2021-06-23: qty 2

## 2021-06-23 NOTE — ED Notes (Signed)
ED TO INPATIENT HANDOFF REPORT  ED Nurse Name and Phone #: Darnelle Maffucci [phone]  B Name/Age/Gender Jill Phillips 65 y.o. female Room/Bed: 018C/018C  Code Status   Code Status: Full Code  Home/SNF/Other Home Patient oriented to: self, place, and time Is this baseline? Yes      Chief Complaint Hypoxia [R09.02]  Triage Note Pt here POV with c/o leg pain and swelling X1 week. Edema noted. Pt states urinating is becoming increasing more difficult. States she had kidney failure.    Allergies Allergies  Allergen Reactions   Flagyl [Metronidazole] Diarrhea   Limonene Itching    Yeast infection in mouth   Sulfa Antibiotics Other (See Comments)    Yeast infection in mouth   Doxycycline     Mouth soreness - reaction vs thrush?   Amoxicillin Other (See Comments)    REACTION: Oral yeast infection Has patient had a PCN reaction causing immediate rash, facial/tongue/throat swelling, SOB or lightheadedness with hypotension: No Has patient had a PCN reaction causing severe rash involving mucus membranes or skin necrosis: No Has patient had a PCN reaction that required hospitalization No Has patient had a PCN reaction occurring within the last 10 years: No If all of the above answers are "NO", then may proceed with Cephalosporin use.    Chlorzoxazone Other (See Comments)    headache   Codeine Other (See Comments)    headache   Darvocet [Propoxyphene N-Acetaminophen] Itching   Dilaudid [Hydromorphone Hcl] Itching   Keflex [Cephalexin] Other (See Comments)    Pt does not recall reaction (maybe yeast infection)   Morphine And Related Itching   Nitrofurantoin Monohyd Macro Hives    Reaction to [employer] [Oxycodone-Acetaminophen] Itching    Patient can tolerate Acetaminophen solely   Trazodone And Nefazodone Palpitations    Level of Care/Admitting Diagnosis ED Disposition     ED Disposition  Admit   Condition  --   [place]: [place] [facility]  [[id number]]  Level of Care: Med-Surg [16]  May place patient in observation at [facility] or [facility] if equivalent level of care is available:: No  Covid Evaluation: Asymptomatic Screening Protocol (No Symptoms)  Diagnosis: Hypoxia [300808]  Admitting Physician: Lequita Halt [[id number]]  Attending Physician: Lequita Halt [[id number]]          B Medical/Surgery History Past Medical History:  Diagnosis Date   Active smoker    Anxiety    Calcifying tendinitis of shoulder    Chronic back pain    Chronic pain syndrome    COPD (chronic obstructive pulmonary disease) ([place])    Dysthymic disorder    Emphysema lung ([place])    GERD (gastroesophageal reflux disease)    Headache    "weekly maybe" (01/31/2016)   Heart murmur    for years, nothing to be concerned about   Herpes genitalia    History of blood transfusion 1980   related to "back surgery"   Hyperlipidemia    Hypertension    Hypothyroidism    Lumbago    Osteoarthrosis, unspecified whether generalized or localized, lower leg    Pain in joint, upper arm    Pneumothorax, left 01/31/2016   S/P Left posterior subcostal pain injection on 01/30/2016   PONV (postoperative nausea and vomiting)    gets nauseous  with longer surgery. Difficuty voiding after surgery   Postlaminectomy syndrome, thoracic region    Primary localized osteoarthrosis, lower leg    Restless leg syndrome  Sleep apnea    s/p surgery- last sleep study 2011- doesnt use oxygen or machine at night as instructed,.   12/2014- Dr Halford Chessman  reports it is negative.   Thyroid disease    Past Surgical History:  Procedure Laterality Date   APPENDECTOMY     BACK SURGERY     18 back surgeries (2 thoracic & 16 lumbar) (01/31/2016)   DILATION AND CURETTAGE OF UTERUS     HAMMER TOE SURGERY     IR RADIOLOGIST EVAL & MGMT  07/21/2017   JOINT REPLACEMENT     KNEE ARTHROSCOPY Right    LAPAROSCOPIC CHOLECYSTECTOMY     LUMBAR FUSION N/A 08/01/2015   Procedure: Right sided L1-2  and L2-3 transforaminal lumbar interbody fusion with cages, Extension of posterior fusion T12 to L3, Replaced pedicle screws bilaterally L1-L2 , Replaced left sided pedicle screws L-3. Instrumentation T12 to L3 using local bone graft, Vivigen allograft and cancellous chips;  Surgeon: [name], MD;  Location: [facility];  Service: Orthopedics;  Laterality: N/A;   LUMBAR LAMINECTOMY/DECOMPRESSION MICRODISCECTOMY N/A 01/28/2014   Procedure: Minimally Invasive Right  L1-2 Microdiscectomy;  Surgeon: [name], MD;  Location: [facility];  Service: Orthopedics;  Laterality: N/A;   REVERSE SHOULDER ARTHROPLASTY Left 09/09/2019   Procedure: LEFT REVERSE SHOULDER REPLACEMENT;  Surgeon: Meredith Pel, MD;  Location: [facility];  Service: Orthopedics;  Laterality: Left;   TOTAL HIP ARTHROPLASTY Right    TOTAL KNEE ARTHROPLASTY  05/29/2012   Procedure: TOTAL KNEE ARTHROPLASTY;  Surgeon: Mcarthur Rossetti, MD;  [facility];  Service: Orthopedics;  Laterality: Right;  Right Total Knee Arthroplasty   TUBAL LIGATION     UVULOPALATOPHARYNGOPLASTY     VAGINAL HYSTERECTOMY       A IV Location/Drains/Wounds Patient Lines/Drains/Airways Status     Active Line/Drains/Airways     Name Placement date Placement time Site Days   Peripheral IV 06/23/21 20 G Anterior;Proximal;Right Forearm 06/23/21  1417  Forearm  less than 1   Incision (Closed) 09/09/19 Arm Left 09/09/19  1535  -- 653            Intake/Output Last 24 hours No intake or output data in the 24 hours ending 06/23/21 1827  Labs/Imaging Results for orders placed or performed during the hospital encounter of 06/23/21 (from the past 48 hour(s))  Basic metabolic panel     Status: Abnormal   Collection Time: 06/23/21 11:41 AM  Result Value Ref Range   Sodium 142 135 - 145 mmol/L   Potassium 4.3 3.5 - 5.1 mmol/L   Chloride 108 98 - 111 mmol/L   CO2 24 22 - 32 mmol/L   Glucose, Bld 90 70 - 99 mg/dL    Comment: Glucose reference range applies  only to samples taken after fasting for at least 8 hours.   BUN 11 8 - 23 mg/dL   Creatinine, Ser 1.12 (H) 0.44 - 1.00 mg/dL   Calcium 8.8 (L) 8.9 - 10.3 mg/dL   GFR, Estimated 54 (L) >60 mL/min    Comment: (NOTE) Calculated using the CKD-EPI Creatinine Equation (2021)    Anion gap 10 5 - 15    Comment: Performed at [facility] [address], [place] [address]  CBC     Status: Abnormal   Collection Time: 06/23/21 11:41 AM  Result Value Ref Range   WBC 8.5 4.0 - 10.5 K/uL   RBC 4.15 3.87 - 5.11 MIL/uL   Hemoglobin 12.9 12.0 -  15.0 g/dL   HCT 42.3 36.0 - 46.0 %   MCV 101.9 (H) 80.0 - 100.0 fL   MCH 31.1 26.0 - 34.0 pg   MCHC 30.5 30.0 - 36.0 g/dL   RDW 14.2 11.5 - 15.5 %   Platelets 245 150 - 400 K/uL   nRBC 0.0 0.0 - 0.2 %    Comment: Performed at [facility], [place] [address], [place], [place] [address]  Brain natriuretic peptide     Status: Abnormal   Collection Time: 06/23/21  1:43 PM  Result Value Ref Range   B Natriuretic Peptide 159.3 (H) 0.0 - 100.0 pg/mL    Comment: Performed at [facility] [address], [place], [place] [address]  Hepatic function panel     Status: Abnormal   Collection Time: 06/23/21  1:44 PM  Result Value Ref Range   Total Protein 6.4 (L) 6.5 - 8.1 g/dL   Albumin 3.5 3.5 - 5.0 g/dL   AST 14 (L) 15 - 41 U/L   ALT 10 0 - 44 U/L   Alkaline Phosphatase 119 38 - 126 U/L   Total Bilirubin 0.5 0.3 - 1.2 mg/dL   Bilirubin, Direct <0.1 0.0 - 0.2 mg/dL   Indirect Bilirubin NOT CALCULATED 0.3 - 0.9 mg/dL    Comment: Performed at [facility] [address], [place], [place] [address]  D-dimer, quantitative     Status: Abnormal   Collection Time: 06/23/21  1:44 PM  Result Value Ref Range   D-Dimer, Quant 0.98 (H) 0.00 - 0.50 ug/mL-FEU    Comment: (NOTE) At the manufacturer cut-off value of 0.5 g/mL FEU, this assay has a negative predictive value of 95-100%.This assay is intended for use in conjunction with a  clinical pretest probability (PTP) assessment model to exclude pulmonary embolism (PE) and deep venous thrombosis (DVT) in outpatients suspected of PE or DVT. Results should be correlated with clinical presentation. Performed at [facility], [place] [address] [place], [place] [address]    *Note: Due to a large number of results and/or encounters for the requested time period, some results have not been displayed. A complete set of results can be found in Results Review.   CT Angio Chest PE W and/or Wo Contrast  Result Date: 06/23/2021 CLINICAL DATA:  PE suspected, low/intermediate prob, positive D-dimer EXAM: CT ANGIOGRAPHY CHEST WITH CONTRAST TECHNIQUE: Multidetector CT imaging of the chest was performed using the standard protocol during bolus administration of intravenous contrast. Multiplanar CT image reconstructions and MIPs were obtained to evaluate the vascular anatomy. CONTRAST:  54mL OMNIPAQUE IOHEXOL 350 MG/ML SOLN COMPARISON:  November 23, 2014 FINDINGS: Evaluation is limited secondary to positioning and streak artifact. Cardiovascular: Satisfactory opacification of the pulmonary arteries to the segmental level. No evidence of pulmonary embolism. Heart is the upper limits of normal in size. LEFT-sided coronary artery atherosclerotic calcifications. Mild atherosclerotic calcifications of the aorta. No pericardial effusion. Mediastinum/Nodes: Visualized thyroid is unremarkable. There is a prominent preaortic lymph node which measures 10 mm in the short axis, previously 8 mm (series 7, image 120). No axillary adenopathy. Lungs/Pleura: No pleural effusion or pneumothorax. Bilateral dependent consolidative enhancing opacities most consistent with atelectasis. Mild to moderate centrilobular emphysema. Mild bronchial wall thickening. Upper Abdomen: No acute abnormality. Musculoskeletal: Status post LEFT shoulder arthroplasty. Status post posterior fixation the thoracolumbar spine. Review of the  MIP images confirms the above findings. IMPRESSION: 1. No acute pulmonary embolism. 2. Mild to moderate centrilobular emphysema with  mild bronchial wall thickening which could reflect bronchitis. Given underlying emphysema, recommend evaluation for candidacy for annual lung cancer screening CT. 3. Mildly prominent lymph node of the AP window, nonspecific and likely reactive. Aortic Atherosclerosis (ICD10-I70.0) and Emphysema (ICD10-J43.9). Electronically Signed   By: Valentino Saxon M.D.   On: 06/23/2021 15:56   DG Chest Port 1 View  Result Date: 06/23/2021 CLINICAL DATA:  Shortness of breath EXAM: PORTABLE CHEST 1 VIEW COMPARISON:  Chest radiograph dated 02/04/2021. FINDINGS: The patient is rotated to the right. The heart size and mediastinal contours are within normal limits. Both lungs are clear. Fixation hardware is seen in the spine and the patient is status post a left shoulder arthroplasty. Degenerative changes are seen in the spine. IMPRESSION: No active disease. Electronically Signed   By: Zerita Boers M.D.   On: 06/23/2021 14:21    Pending Labs Unresulted Labs (From admission, onward)     Start     Ordered   06/24/21 0254  Basic metabolic panel  Tomorrow morning,   R        06/23/21 1718   06/23/21 1725  Urinalysis, Routine w reflex microscopic  Once,   R        06/23/21 1724   06/23/21 1653  Blood gas, arterial  Once,   R        06/23/21 1652            Vitals/Pain Today's Vitals   06/23/21 1621 06/23/21 1721 06/23/21 1722 06/23/21 1823  BP: (!) 105/57 96/66  125/82  Pulse: 69 75  66  Resp: 17 18  16   Temp: 98.4 F (36.9 C)   98.2 F (36.8 C)  TempSrc: Oral   Oral  SpO2: 96% 95%  96%  PainSc: 6  6  6   0-No pain    Isolation Precautions No active isolations  Medications Medications  nicotine (NICODERM CQ - dosed in mg/24 hours) patch 14 mg (14 mg Transdermal Patch Applied 06/23/21 1719)  HYDROcodone-acetaminophen (NORCO) 10-325 MG per tablet 1-4 tablet (has  no administration in time range)  amLODipine (NORVASC) tablet 10 mg (has no administration in time range)    And  benazepril (LOTENSIN) tablet 40 mg (has no administration in time range)  atorvastatin (LIPITOR) tablet 20 mg (has no administration in time range)  nebivolol (BYSTOLIC) tablet 10 mg (has no administration in time range)  QUEtiapine (SEROQUEL) tablet 150 mg (has no administration in time range)  venlafaxine XR (EFFEXOR-XR) 24 hr capsule 300 mg (has no administration in time range)  levothyroxine (SYNTHROID) tablet 88 mcg (has no administration in time range)  docusate sodium (COLACE) capsule 300 mg (has no administration in time range)  pantoprazole (PROTONIX) EC tablet 40 mg (has no administration in time range)  darifenacin (ENABLEX) 24 hr tablet 7.5 mg (has no administration in time range)  clonazePAM (KLONOPIN) tablet 1 mg (has no administration in time range)  gabapentin (NEURONTIN) capsule 100 mg (has no administration in time range)  rOPINIRole (REQUIP) tablet 1-2 mg (has no administration in time range)  tiZANidine (ZANAFLEX) tablet 4 mg (has no administration in time range)  albuterol (VENTOLIN HFA) 108 (90 Base) MCG/ACT inhaler 2 puff (has no administration in time range)  tiotropium (SPIRIVA) inhalation capsule ([facility] use ONLY) 18 mcg (has no administration in time range)  iohexol (OMNIPAQUE) 350 MG/ML injection 80 mL (80 mLs Intravenous Contrast Given 06/23/21 1544)  HYDROcodone-acetaminophen (NORCO/VICODIN) 5-325 MG per tablet 2 tablet (2 tablets Oral Given 06/23/21 1646)  Mobility walks        R Recommendations: See Admitting Provider Note  Report given to:   Additional Notes: currently on 3L via [place], pt will de sat when ambulating. Was given norco for pain, pain is currently 0

## 2021-06-23 NOTE — Progress Notes (Signed)
ABG results hand delivered to the provider.

## 2021-06-23 NOTE — ED Notes (Signed)
Changed patient bed it was soiled with urine

## 2021-06-23 NOTE — ED Notes (Signed)
Pt in bed, pt states that she is having some back pain, states that her pain is a 6/10, states that she normally takes hydrocodone and requests some

## 2021-06-23 NOTE — ED Provider Notes (Addendum)
Emergency Medicine Provider Triage Evaluation Note  Jill Phillips , a 65 y.o. female  was evaluated in triage.  Pt complains of bilateral lower leg edema and pain as well as "trouble urinating" since yesterday.  Patient reports she had an episode of kidney failure back in June and is concerned for this again. She does not know why her "kidneys failed" in June.  Additionally, she mentions she possibly might have some fatigue. Denies any chest pain or shortness of breath.  Denies any history of CHF.   Review of Systems  Positive: Bilateral lower leg edema, trouble urinating, fatigue Negative: Dysuria, hematuria., chest pain, SOB  Physical Exam  BP (!) 162/86 (BP Location: Left Arm)   Pulse 89   Temp 98.6 F (37 C) (Oral)   Resp 20   SpO2 94%  Gen:   Awake, no distress   Resp:  Normal effort  MSK:   Moves extremities without difficulty. Trace pitting edema to bilateral lower legs and feet. Pulses intact. Cap refill < 2 seconds.  Other:    Medical Decision Making  Medically screening exam initiated at 11:35 AM.  Appropriate orders placed.  Jill Phillips was informed that the remainder of the evaluation will be completed by another provider, this initial triage assessment does not replace that evaluation, and the importance of remaining in the ED until their evaluation is complete.  Labs ordered.    Sherrell Puller, PA-C 06/23/21 1140    Sherrell Puller, PA-C 06/23/21 1143    Isla Pence, MD 06/23/21 1231

## 2021-06-23 NOTE — ED Notes (Addendum)
Walked patient to the  bathroom with oxygen patient oxygen level dropped to 88 on 3 litters hen went back up to 92 patient is now back in bed on the oxygen

## 2021-06-23 NOTE — ED Triage Notes (Signed)
Pt here POV with c/o leg pain and swelling X1 week. Edema noted. Pt states urinating is becoming increasing more difficult. States she had kidney failure.

## 2021-06-23 NOTE — H&P (Signed)
History and Physical    Jill Phillips:[id number] DOB: [birth year] DOA: 06/23/2021  PCP: [name], MD (Confirm with patient/family/NH records and if not entered, this has to be entered at [facility] point of entry) Patient coming from: Home  I have personally briefly reviewed patient's old medical records in [place]  Chief Complaint: SOB  HPI: Jill Phillips is a 65 y.o. female with medical history significant of COPD Gold stage I 2017, HTN, lumbar degeneration status post multiple surgeries and on narcotics chronically, chronic ambulation dysfunction, cigar smoker, anxiety/depression, restless leg syndrome, presented with worsening of shortness of breath.  Patient came in with complaining about bilateral leg pain and swelling for 1 week.  Denies any recent long distance travel.  She has very limited exercise tolerance, which she attributed largely to her chronic back pains, but when she ambulate, she soon feel out of breath easily.  She uses albuterol and Spiriva occasionally.  Denies any fever chills.  She also reported her legs swelling last night, but resolved today.  ED Course: O2 saturation 87% on room air, stabilized on 4 L.  Chest x-ray clean, CT angiogram negative for PE, but only extensive emphysema.  Review of Systems: As per HPI otherwise 14 point review of systems negative.    Past Medical History:  Diagnosis Date   Active smoker    Anxiety    Calcifying tendinitis of shoulder    Chronic back pain    Chronic pain syndrome    COPD (chronic obstructive pulmonary disease) (HCC)    Dysthymic disorder    Emphysema lung (HCC)    GERD (gastroesophageal reflux disease)    Headache    "weekly maybe" (01/31/2016)   Heart murmur    for years, nothing to be concerned about   Herpes genitalia    History of blood transfusion 1980   related to "back surgery"   Hyperlipidemia    Hypertension    Hypothyroidism    Lumbago    Osteoarthrosis, unspecified whether  generalized or localized, lower leg    Pain in joint, upper arm    Pneumothorax, left 01/31/2016   S/P Left posterior subcostal pain injection on 01/30/2016   PONV (postoperative nausea and vomiting)    gets nauseous  with longer surgery. Difficuty voiding after surgery   Postlaminectomy syndrome, thoracic region    Primary localized osteoarthrosis, lower leg    Restless leg syndrome    Sleep apnea    s/p surgery- last sleep study 2011- doesnt use oxygen or machine at night as instructed,.   12/2014- Dr Halford Chessman  reports it is negative.   Thyroid disease     Past Surgical History:  Procedure Laterality Date   APPENDECTOMY     BACK SURGERY     18 back surgeries (2 thoracic & 16 lumbar) (01/31/2016)   DILATION AND CURETTAGE OF UTERUS     HAMMER TOE SURGERY     IR RADIOLOGIST EVAL & MGMT  07/21/2017   JOINT REPLACEMENT     KNEE ARTHROSCOPY Right    LAPAROSCOPIC CHOLECYSTECTOMY     LUMBAR FUSION N/A 08/01/2015   Procedure: Right sided L1-2 and L2-3 transforaminal lumbar interbody fusion with cages, Extension of posterior fusion T12 to L3, Replaced pedicle screws bilaterally L1-L2 , Replaced left sided pedicle screws L-3. Instrumentation T12 to L3 using local bone graft, Vivigen allograft and cancellous chips;  Surgeon: [name], MD;  Location: [place];  Service: Orthopedics;  Laterality: N/A;   LUMBAR  LAMINECTOMY/DECOMPRESSION MICRODISCECTOMY N/A 01/28/2014   Procedure: Minimally Invasive Right  L1-2 Microdiscectomy;  Surgeon: [name], MD;  Location: [place];  Service: Orthopedics;  Laterality: N/A;   REVERSE SHOULDER ARTHROPLASTY Left 09/09/2019   Procedure: LEFT REVERSE SHOULDER REPLACEMENT;  Surgeon: Meredith Pel, MD;  Location: [place];  Service: Orthopedics;  Laterality: Left;   TOTAL HIP ARTHROPLASTY Right    TOTAL KNEE ARTHROPLASTY  05/29/2012   Procedure: TOTAL KNEE ARTHROPLASTY;  Surgeon: Mcarthur Rossetti, MD;  Location: [facility];  Service: Orthopedics;  Laterality: Right;   Right Total Knee Arthroplasty   TUBAL LIGATION     UVULOPALATOPHARYNGOPLASTY     VAGINAL HYSTERECTOMY       reports that she has been smoking cigarettes. She has a 56.25 pack-year smoking history. She has never used smokeless tobacco. She reports that she does not drink alcohol and does not use drugs.  Allergies  Allergen Reactions   Flagyl [Metronidazole] Diarrhea   Limonene Itching    Yeast infection in mouth   Sulfa Antibiotics Other (See Comments)    Yeast infection in mouth   Doxycycline     Mouth soreness - reaction vs thrush?   Amoxicillin Other (See Comments)    REACTION: Oral yeast infection Has patient had a PCN reaction causing immediate rash, facial/tongue/throat swelling, SOB or lightheadedness with hypotension: No Has patient had a PCN reaction causing severe rash involving mucus membranes or skin necrosis: No Has patient had a PCN reaction that required hospitalization No Has patient had a PCN reaction occurring within the last 10 years: No If all of the above answers are "NO", then may proceed with Cephalosporin use.    Chlorzoxazone Other (See Comments)    headache   Codeine Other (See Comments)    headache   Darvocet [Propoxyphene N-Acetaminophen] Itching   Dilaudid [Hydromorphone Hcl] Itching   Keflex [Cephalexin] Other (See Comments)    Pt does not recall reaction (maybe yeast infection)   Morphine And Related Itching   Nitrofurantoin Monohyd Macro Hives    Reaction to [employer] [Oxycodone-Acetaminophen] Itching    Patient can tolerate Acetaminophen solely   Trazodone And Nefazodone Palpitations    Family History  Problem Relation Age of Onset   Kidney disease Mother    Heart disease Father    Anuerysm Brother 65       brain   Heart disease Brother    Heart disease Sister 42       s/p CABG   Hypertension Sister    Colon cancer Neg Hx      Prior to Admission medications   Medication Sig Start Date End Date Taking? Authorizing  Provider  acetaminophen (TYLENOL) 500 MG tablet Take 1,000 mg by mouth every 6 (six) hours as needed (for headaches.).   Yes [provider]  albuterol (PROAIR HFA) 108 (90 Base) MCG/ACT inhaler INHALE 2 PUFFS INTO THE LUNGS EVERY 6 HOURS AS NEEDED FOR WHEEZING OR SHORTNESS OF BREATH. Patient taking differently: Inhale 2 puffs into the lungs every 6 (six) hours as needed for wheezing or shortness of breath. 11/23/20  Yes Burns, Claudina Lick, MD  amLODipine-benazepril (LOTREL) 10-40 MG capsule TAKE 1 CAPSULE BY MOUTH DAILY. 04/04/21  Yes Burns, Claudina Lick, MD  atorvastatin (LIPITOR) 20 MG tablet TAKE 1 TABLET BY MOUTH DAILY. Patient taking differently: Take 20 mg by mouth daily. 02/02/21  Yes Burns, Claudina Lick, MD  clonazePAM (KLONOPIN) 1 MG tablet TAKE 1 TABLET BY MOUTH 2 TIMES  DAILY AS NEEDED FOR ANXIETY. Patient taking differently: Take 1 mg by mouth 2 (two) times daily as needed (rls). 05/27/21  Yes Burns, Claudina Lick, MD  docusate sodium (COLACE) 100 MG capsule Take 300 mg by mouth at bedtime.   Yes [provider]  fentaNYL (DURAGESIC) 12 MCG/HR 1 patch every 3 (three) days. 02/19/21  Yes [provider]  gabapentin (NEURONTIN) 100 MG capsule Take 1 capsule (100 mg total) by mouth 2 (two) times daily. 02/06/21 06/23/21 Yes Dessa Phi, DO  HYDROcodone-acetaminophen (NORCO) 10-325 MG tablet Take 1 tablet by mouth every 6 (six) hours as needed for severe pain (Chronic back pain). Patient taking differently: Take 1-4 tablets by mouth every 6 (six) hours as needed for severe pain (Chronic back pain). Patient takes medication BID 03/15/20  Yes [name], MD  levothyroxine (SYNTHROID) 88 MCG tablet TAKE 1 TABLET BY MOUTH DAILY. Patient taking differently: Take 88 mcg by mouth daily before breakfast. 02/14/21  Yes Burns, Claudina Lick, MD  NARCAN 4 MG/0.1ML LIQD nasal spray kit Place 1 spray into the nose as needed (accidental overdose). 03/15/20  Yes [name], MD  nebivolol (BYSTOLIC) 10 MG  tablet Take 1 tablet (10 mg total) by mouth daily. 04/26/21  Yes Burns, Claudina Lick, MD  omeprazole (PRILOSEC) 40 MG capsule Take 1 capsule (40 mg total) by mouth 2 (two) times daily. 02/12/21  Yes Burns, Claudina Lick, MD  QUEtiapine (SEROQUEL) 100 MG tablet TAKE 1 AND 1/2 TABLETS BY MOUTH AT BEDTIME. Patient taking differently: Take 150 mg by mouth at bedtime. 02/07/21  Yes Burns, Claudina Lick, MD  rOPINIRole (REQUIP) 1 MG tablet TAKE 1 TO 2 TABLETS BY MOUTH TWO TIMES DAILY AS NEEDED Patient taking differently: Take 1-2 mg by mouth 2 (two) times daily as needed. 05/08/21  Yes Burns, Claudina Lick, MD  solifenacin (VESICARE) 10 MG tablet Take 10 mg by mouth daily. 01/12/21  Yes [provider]  tiotropium (SPIRIVA HANDIHALER) 18 MCG inhalation capsule Place 1 capsule (18 mcg total) into inhaler and inhale daily. 04/26/21  Yes Burns, Claudina Lick, MD  tiZANidine (ZANAFLEX) 4 MG tablet TAKE 1 TABLET BY MOUTH EVERY 8 HOURS AS NEEDED Patient taking differently: Take 4 mg by mouth every 8 (eight) hours as needed for muscle spasms. 05/31/21  Yes [name], MD  valACYclovir (VALTREX) 500 MG tablet Take 1 tablet (500 mg total) by mouth daily. 06/14/21  Yes Burns, Claudina Lick, MD  venlafaxine XR (EFFEXOR-XR) 150 MG 24 hr capsule TAKE 2 CAPSULES BY MOUTH DAILY. NEED OFFICE VISIT FOR REFILLS Patient taking differently: Take 300 mg by mouth daily with breakfast. 06/15/21  Yes Burns, Claudina Lick, MD  cimetidine (TAGAMET) 400 MG tablet Take 400 mg by mouth in the morning. Patient not taking: Reported on 06/23/2021 08/24/19   [provider]    Physical Exam: Vitals:   06/23/21 1445 06/23/21 1500 06/23/21 1515 06/23/21 1621  BP: 121/63 101/78 (!) 112/57 (!) 105/57  Pulse: 63 63 64 69  Resp: _0 Temp:    98.4 F (36.9 C)  TempSrc:    Oral  SpO2: 97% 95% 96% 96%    Constitutional: NAD, calm, comfortable Vitals:   06/23/21 1445 06/23/21 1500 06/23/21 1515 06/23/21 1621  BP: 121/63 101/78 (!) 112/57 (!) 105/57   Pulse: 63 63 64 69  Resp: _1 Temp:    98.4 F (36.9 C)  TempSrc:    Oral  SpO2: 97%  95% 96% 96%   Eyes: PERRL, lids and conjunctivae normal ENMT: Mucous membranes are moist. Posterior pharynx clear of any exudate or lesions.Normal dentition.  Neck: normal, supple, no masses, no thyromegaly Respiratory: clear to auscultation bilaterally, no wheezing, no crackles. Normal respiratory effort. No accessory muscle use.  Cardiovascular: Regular rate and rhythm, no murmurs / rubs / gallops. No extremity edema. 2+ pedal pulses. No carotid bruits.  Abdomen: no tenderness, no masses palpated. No hepatosplenomegaly. Bowel sounds positive.  Musculoskeletal: no clubbing / cyanosis. No joint deformity upper and lower extremities. Good ROM, no contractures. Normal muscle tone.  Skin: no rashes, lesions, ulcers. No induration Neurologic: CN 2-12 grossly intact. Sensation intact, DTR normal. Strength 5/5 in all 4.  Psychiatric: Normal judgment and insight. Alert and oriented x 3. Normal mood.   (  Labs on Admission: I have personally reviewed following labs and imaging studies  CBC: Recent Labs  Lab 06/23/21 1141  WBC 8.5  HGB 12.9  HCT 42.3  MCV 101.9*  PLT 696   Basic Metabolic Panel: Recent Labs  Lab 06/23/21 1141  NA 142  K 4.3  CL 108  CO2 24  GLUCOSE 90  BUN 11  CREATININE 1.12*  CALCIUM 8.8*   GFR: CrCl cannot be calculated (Unknown ideal weight.). Liver Function Tests: Recent Labs  Lab 06/23/21 1344  AST 14*  ALT 10  ALKPHOS 119  BILITOT 0.5  PROT 6.4*  ALBUMIN 3.5   No results for input(s): LIPASE, AMYLASE in the last 168 hours. No results for input(s): AMMONIA in the last 168 hours. Coagulation Profile: No results for input(s): INR, PROTIME in the last 168 hours. Cardiac Enzymes: No results for input(s): CKTOTAL, CKMB, CKMBINDEX, TROPONINI in the last 168 hours. BNP (last 3 results) No results for input(s): PROBNP in the last 8760  hours. HbA1C: No results for input(s): HGBA1C in the last 72 hours. CBG: No results for input(s): GLUCAP in the last 168 hours. Lipid Profile: No results for input(s): CHOL, HDL, LDLCALC, TRIG, CHOLHDL, LDLDIRECT in the last 72 hours. Thyroid Function Tests: No results for input(s): TSH, T4TOTAL, FREET4, T3FREE, THYROIDAB in the last 72 hours. Anemia Panel: No results for input(s): VITAMINB12, FOLATE, FERRITIN, TIBC, IRON, RETICCTPCT in the last 72 hours. Urine analysis:    Component Value Date/Time   COLORURINE YELLOW 04/23/2021 2157   APPEARANCEUR CLEAR 04/23/2021 2157   APPEARANCEUR Clear 01/01/2018 1200   LABSPEC 1.012 04/23/2021 2157   PHURINE 7.0 04/23/2021 2157   GLUCOSEU NEGATIVE 04/23/2021 2157   GLUCOSEU Color Interference (A) 12/30/2019 1334   HGBUR NEGATIVE 04/23/2021 2157   BILIRUBINUR NEGATIVE 04/23/2021 2157   BILIRUBINUR negative 06/20/2020 1222   BILIRUBINUR Negative 01/01/2018 [place] 04/23/2021 2157   PROTEINUR NEGATIVE 04/23/2021 2157   UROBILINOGEN 0.2 06/20/2020 1222   UROBILINOGEN Color Interference (A) 12/30/2019 1334   NITRITE NEGATIVE 04/23/2021 2157   LEUKOCYTESUR NEGATIVE 04/23/2021 2157    Radiological Exams on Admission: CT Angio Chest PE W and/or Wo Contrast  Result Date: 06/23/2021 CLINICAL DATA:  PE suspected, low/intermediate prob, positive D-dimer EXAM: CT ANGIOGRAPHY CHEST WITH CONTRAST TECHNIQUE: Multidetector CT imaging of the chest was performed using the standard protocol during bolus administration of intravenous contrast. Multiplanar CT image reconstructions and MIPs were obtained to evaluate the vascular anatomy. CONTRAST:  43m OMNIPAQUE IOHEXOL 350 MG/ML SOLN COMPARISON:  November 23, 2014 FINDINGS: Evaluation is limited secondary to positioning and streak artifact. Cardiovascular: Satisfactory opacification of the pulmonary arteries to the segmental  level. No evidence of pulmonary embolism. Heart is the upper limits of  normal in size. LEFT-sided coronary artery atherosclerotic calcifications. Mild atherosclerotic calcifications of the aorta. No pericardial effusion. Mediastinum/Nodes: Visualized thyroid is unremarkable. There is a prominent preaortic lymph node which measures 10 mm in the short axis, previously 8 mm (series 7, image 120). No axillary adenopathy. Lungs/Pleura: No pleural effusion or pneumothorax. Bilateral dependent consolidative enhancing opacities most consistent with atelectasis. Mild to moderate centrilobular emphysema. Mild bronchial wall thickening. Upper Abdomen: No acute abnormality. Musculoskeletal: Status post LEFT shoulder arthroplasty. Status post posterior fixation the thoracolumbar spine. Review of the MIP images confirms the above findings. IMPRESSION: 1. No acute pulmonary embolism. 2. Mild to moderate centrilobular emphysema with mild bronchial wall thickening which could reflect bronchitis. Given underlying emphysema, recommend evaluation for candidacy for annual lung cancer screening CT. 3. Mildly prominent lymph node of the AP window, nonspecific and likely reactive. Aortic Atherosclerosis (ICD10-I70.0) and Emphysema (ICD10-J43.9). Electronically Signed   By: Valentino Saxon M.D.   On: 06/23/2021 15:56   DG Chest Port 1 View  Result Date: 06/23/2021 CLINICAL DATA:  Shortness of breath EXAM: PORTABLE CHEST 1 VIEW COMPARISON:  Chest radiograph dated 02/04/2021. FINDINGS: The patient is rotated to the right. The heart size and mediastinal contours are within normal limits. Both lungs are clear. Fixation hardware is seen in the spine and the patient is status post a left shoulder arthroplasty. Degenerative changes are seen in the spine. IMPRESSION: No active disease. Electronically Signed   By: Zerita Boers M.D.   On: 06/23/2021 14:21    EKG: Independently reviewed. Sinus, no acute ST-T changes.  Assessment/Plan Active Problems:   Hypoxia  (please populate well all problems here in  Problem List. (For example, if patient is on BP meds at home and you resume or decide to hold them, it is a problem that needs to be her. Same for CAD, COPD, HLD and so on)  Acute, probably on chronic at baseline, hypoxic respiratory failure -Likely secondary to advanced COPD. -ABG after off O2 in the ED showed 7.363/46.6/59.  I tried to remove the oxygen supplement and implemented, her O2 saturation dropped to 88% room air.  Ambulation pulse ox, and nocturnal pulse ox (suspect her so-called restless leg syndrome probably from nocturnal hypoxia) -Consult case manager for home O2 establishment.  Peripheral edema -No significant edema appreciated in the ED, ordered DVT study. -Outpatient general surgery evaluation of venous insufficiency.  COPD -No symptoms or signs of acute exacerbation. -Educated patient to use Spiriva regularly instead of as needed.  Restless leg syndrome -Controlled, again suspect nocturnal hypoxia is the culprit.  Check nocturnal pulse ox. -Continue ropinirole, Klonopin.  HTN -Continue amlodipine and ARB and beta-blocker  Cigarette smoker -Educated patient to quit smoking.  Anxiety/depression -Continue SSRI  Hypothyroidism -Continue Synthroid.   DVT prophylaxis: SCD Code Status: Full code Family Communication: Daughter at bedside Disposition Plan: Expect less than 2 midnight hospital stay Consults called: None Admission status: MedSurg Obs   Lequita Halt MD [facility] Pager [phone]  06/23/2021, 5:19 PM

## 2021-06-23 NOTE — ED Provider Notes (Signed)
[facility] Provider Note   CSN: [id number] Arrival date & time: 06/23/21  1127     History Chief Complaint  Patient presents with   Leg Pain    Jill Phillips is a 65 y.o. Jill [facility] is a 65yo female with medical history of acute-on-chronic bilateral leg edema and restless leg syndrome who presents to the ED with bilateral leg swelling x 1 day. Pt noticed that her feet were swollen yesterday and have become painful with an achy, pressured pain 7/10. She states her swelling and pain worsen when she is standing/walking and that nothing she has tried has relieved her symptoms. She admits to high salt and fluid intake. She denies SOB, CP, abdominal distension, weight gain, leg discoloration, extremity temperature change, or unilateral pain. Pt is currently prescribed amlodipine and wonders if this is related. She has previously been prescribed compression stockings and a low salt diet for acute-on-chronic bilateral leg edema and she states she is non-compliant with these regimens. She has not used her compression stockings or taken any medications for her leg swelling since yesterday    Leg Pain     Past Medical History:  Diagnosis Date   Active smoker    Anxiety    Calcifying tendinitis of shoulder    Chronic back pain    Chronic pain syndrome    COPD (chronic obstructive pulmonary disease) (HCC)    Dysthymic disorder    Emphysema lung (HCC)    GERD (gastroesophageal reflux disease)    Headache    "weekly maybe" (01/31/2016)   Heart murmur    for years, nothing to be concerned about   Herpes genitalia    History of blood transfusion 1980   related to "back surgery"   Hyperlipidemia    Hypertension    Hypothyroidism    Lumbago    Osteoarthrosis, unspecified whether generalized or localized, lower leg    Pain in joint, upper arm    Pneumothorax, left 01/31/2016   S/P Left posterior subcostal pain injection on 01/30/2016   PONV  (postoperative nausea and vomiting)    gets nauseous  with longer surgery. Difficuty voiding after surgery   Postlaminectomy syndrome, thoracic region    Primary localized osteoarthrosis, lower leg    Restless leg syndrome    Sleep apnea    s/p surgery- last sleep study 2011- doesnt use oxygen or machine at night as instructed,.   12/2014- Dr Halford Chessman  reports it is negative.   Thyroid disease     Patient Active Problem List   Diagnosis Date Noted   AKI (acute kidney injury) ([place]) 02/04/2021   Overdose 02/04/2021   Acute gout 06/20/2020   Interstitial cystitis 01/21/2020   Status post shoulder surgery 09/09/2019   Nausea 08/19/2019   Anxiety 05/04/2019   Insomnia 05/04/2019   Syncope 12/08/2018   Disorder of rotator cuff syndrome of left shoulder and allied disorder 06/22/2018   Bilateral leg edema 06/04/2018   Type 2 diabetes mellitus without complication, without long-term current use of insulin ([place]) 12/02/2017   Sacral pain 09/17/2017   COPD (chronic obstructive pulmonary disease) ([place]) 04/21/2017   Lower back pain 03/03/2017   CKD (chronic kidney disease) stage 3, GFR 30-59 ml/min (HCC) 08/07/2016   GERD (gastroesophageal reflux disease) 08/07/2016   Chronic respiratory failure ([place]) 07/29/2016   Arthritis of carpometacarpal (CMC) joint of left thumb 07/09/2016   Pneumothorax on left 01/31/2016   Chronic, continuous use of opioids 09/06/2015   Degenerative disc  disease, lumbar 08/01/2015    Class: Chronic   Spondylolisthesis of lumbar region 08/01/2015    Class: Chronic   Urinary, incontinence, stress female 05/06/2014   Constipation 05/05/2014   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/14/2013   HTN (hypertension) 05/19/2013   HLD (hyperlipidemia) 05/19/2013   Depression 05/19/2013   Hypothyroidism 05/19/2013   Tobacco abuse    Osteoarthritis of both knees 11/18/2011   RLS (restless legs syndrome) 11/18/2011    Past Surgical History:  Procedure Laterality Date   APPENDECTOMY     BACK  SURGERY     18 back surgeries (2 thoracic & 16 lumbar) (01/31/2016)   DILATION AND CURETTAGE OF UTERUS     HAMMER TOE SURGERY     IR RADIOLOGIST EVAL & MGMT  07/21/2017   JOINT REPLACEMENT     KNEE ARTHROSCOPY Right    LAPAROSCOPIC CHOLECYSTECTOMY     LUMBAR FUSION N/A 08/01/2015   Procedure: Right sided L1-2 and L2-3 transforaminal lumbar interbody fusion with cages, Extension of posterior fusion T12 to L3, Replaced pedicle screws bilaterally L1-L2 , Replaced left sided pedicle screws L-3. Instrumentation T12 to L3 using local bone graft, Vivigen allograft and cancellous chips;  Surgeon: James E Nitka, MD;  Location: [facility] OR;  Service: Orthopedics;  Laterality: N/A;   LUMBAR LAMINECTOMY/DECOMPRESSION MICRODISCECTOMY N/A 01/28/2014   Procedure: Minimally Invasive Right  L1-2 Microdiscectomy;  Surgeon: James E Nitka, MD;  Location: [facility] OR;  Service: Orthopedics;  Laterality: N/A;   REVERSE SHOULDER ARTHROPLASTY Left 09/09/2019   Procedure: LEFT REVERSE SHOULDER REPLACEMENT;  Surgeon: Dean, Gregory Scott, MD;  Location: [facility] OR;  Service: Orthopedics;  Laterality: Left;   TOTAL HIP ARTHROPLASTY Right    TOTAL KNEE ARTHROPLASTY  05/29/2012   Procedure: TOTAL KNEE ARTHROPLASTY;  Surgeon: Christopher Y Blackman, MD;  Location: [facility];  Service: Orthopedics;  Laterality: Right;  Right Total Knee Arthroplasty   TUBAL LIGATION     UVULOPALATOPHARYNGOPLASTY     VAGINAL HYSTERECTOMY       OB History   No obstetric history on file.     Family History  Problem Relation Age of Onset   Kidney disease Mother    Heart disease Father    Anuerysm Brother 29       brain   Heart disease Brother    Heart disease Sister 45       s/p CABG   Hypertension Sister    Colon cancer Neg Hx     Social History   Tobacco Use   Smoking status: Every Day    Packs/day: 1.25    Years: 45.00    Pack years: 56.25    Types: Cigarettes   Smokeless tobacco: Never   Tobacco comments:    form given 12/25/16  Vaping Use    Vaping Use: Never used  Substance Use Topics   Alcohol use: No    Alcohol/week: 0.0 standard drinks   Drug use: No    Types: Marijuana    Comment: 01/31/2016 "none since ~ 1980"    Home Medications Prior to Admission medications   Medication Sig Start Date End Date Taking? Authorizing Provider  valACYclovir (VALTREX) 500 MG tablet Take 1 tablet (500 mg total) by mouth daily. 06/14/21   Burns, Stacy J, MD  acetaminophen (TYLENOL) 500 MG tablet Take 1,000 mg by mouth every 6 (six) hours as needed (for headaches.).    [provider]  albuterol (PROAIR HFA) 108 (90 Base) MCG/ACT inhaler INHALE 2 PUFFS INTO THE LUNGS EVERY 6 HOURS AS   NEEDED FOR WHEEZING OR SHORTNESS OF BREATH. Patient taking differently: Inhale 2 puffs into the lungs every 6 (six) hours as needed for wheezing or shortness of breath. 11/23/20   Burns, Stacy J, MD  amLODipine-benazepril (LOTREL) 10-40 MG capsule TAKE 1 CAPSULE BY MOUTH DAILY. 04/04/21   Burns, Stacy J, MD  atorvastatin (LIPITOR) 20 MG tablet TAKE 1 TABLET BY MOUTH DAILY. Patient taking differently: Take 20 mg by mouth daily. 02/02/21   Burns, Stacy J, MD  cimetidine (TAGAMET) 400 MG tablet Take 400 mg by mouth in the morning. 08/24/19   [provider]  clonazePAM (KLONOPIN) 1 MG tablet TAKE 1 TABLET BY MOUTH 2 TIMES DAILY AS NEEDED FOR ANXIETY. 05/27/21   Burns, Stacy J, MD  docusate sodium (COLACE) 100 MG capsule Take 300 mg by mouth at bedtime.    [provider]  fentaNYL (DURAGESIC) 12 MCG/HR 1 patch every 3 (three) days. 02/19/21   [provider]  gabapentin (NEURONTIN) 100 MG capsule Take 1 capsule (100 mg total) by mouth 2 (two) times daily. 02/06/21 05/07/21  Choi, Jennifer, DO  HYDROcodone-acetaminophen (NORCO) 10-325 MG tablet Take 1 tablet by mouth every 6 (six) hours as needed for severe pain (Chronic back pain). Patient taking differently: Take 1-4 tablets by mouth every 6 (six) hours as needed for severe pain (Chronic  back pain). Patient takes medication BID 03/15/20   Nitka, James E, MD  levothyroxine (SYNTHROID) 88 MCG tablet TAKE 1 TABLET BY MOUTH DAILY. 02/14/21   Burns, Stacy J, MD  NARCAN 4 MG/0.1ML LIQD nasal spray kit Place 1 spray into the nose as needed (accidental overdose). 03/15/20   Nitka, James E, MD  nebivolol (BYSTOLIC) 10 MG tablet Take 1 tablet (10 mg total) by mouth daily. 04/26/21   Burns, Stacy J, MD  omeprazole (PRILOSEC) 40 MG capsule Take 1 capsule (40 mg total) by mouth 2 (two) times daily. 02/12/21   Burns, Stacy J, MD  QUEtiapine (SEROQUEL) 100 MG tablet TAKE 1 AND 1/2 TABLETS BY MOUTH AT BEDTIME. 02/07/21   Burns, Stacy J, MD  rOPINIRole (REQUIP) 1 MG tablet TAKE 1 TO 2 TABLETS BY MOUTH TWO TIMES DAILY AS NEEDED 05/08/21   Burns, Stacy J, MD  Sennosides (SENNA LAX PO) Take 2 tablets by mouth at bedtime. 05/20/17   [provider]  solifenacin (VESICARE) 10 MG tablet Take 10 mg by mouth daily. 01/12/21   [provider]  tiotropium (SPIRIVA HANDIHALER) 18 MCG inhalation capsule Place 1 capsule (18 mcg total) into inhaler and inhale daily. 04/26/21   Burns, Stacy J, MD  tiZANidine (ZANAFLEX) 4 MG tablet TAKE 1 TABLET BY MOUTH EVERY 8 HOURS AS NEEDED 05/31/21   Nitka, James E, MD  venlafaxine XR (EFFEXOR-XR) 150 MG 24 hr capsule TAKE 2 CAPSULES BY MOUTH DAILY. NEED OFFICE VISIT FOR REFILLS 06/15/21   Burns, Stacy J, MD    Allergies    Flagyl [metronidazole], Limonene, Sulfa antibiotics, Doxycycline, Amoxicillin, Chlorzoxazone, Codeine, Darvocet [propoxyphene n-acetaminophen], Dilaudid [hydromorphone hcl], Keflex [cephalexin], Morphine and related, Nitrofurantoin monohyd macro, Percocet [oxycodone-acetaminophen], and Trazodone and nefazodone  Review of Systems   Review of Systems Ten systems reviewed and are negative for acute change, except as noted in the HPI.   Physical Exam Updated Vital Signs BP 137/73   Pulse 69   Temp 98.6 F (37 C) (Oral)   Resp (!) 21   SpO2 91%    Physical Exam Vitals and nursing note reviewed.  Constitutional:        General: She is not in acute distress.    Appearance: She is well-developed. She is not diaphoretic.  HENT:     Head: Normocephalic and atraumatic.     Right Ear: External ear normal.     Left Ear: External ear normal.     Nose: Nose normal.     Mouth/Throat:     Mouth: Mucous membranes are moist.  Eyes:     General: No scleral icterus.    Conjunctiva/sclera: Conjunctivae normal.  Cardiovascular:     Rate and Rhythm: Normal rate and regular rhythm.     Heart sounds: Normal heart sounds. No murmur heard.   No friction rub. No gallop.  Pulmonary:     Effort: Pulmonary effort is normal. No respiratory distress.     Breath sounds: Rales present.  Abdominal:     General: Bowel sounds are normal. There is no distension.     Palpations: Abdomen is soft. There is no mass.     Tenderness: There is no abdominal tenderness. There is no guarding.  Musculoskeletal:     Cervical back: Normal range of motion.  Skin:    General: Skin is warm and dry.  Neurological:     Mental Status: She is alert and oriented to person, place, and time.  Psychiatric:        Behavior: Behavior normal.    ED Results / Procedures / Treatments   Labs (all labs ordered are listed, but only abnormal results are displayed) Labs Reviewed  BASIC METABOLIC PANEL - Abnormal; Notable for the following components:      Result Value   Creatinine, Ser 1.12 (*)    Calcium 8.8 (*)    GFR, Estimated 54 (*)    All other components within normal limits  CBC - Abnormal; Notable for the following components:   MCV 101.9 (*)    All other components within normal limits  URINALYSIS, ROUTINE W REFLEX MICROSCOPIC    EKG None  Radiology No results found.  Procedures Procedures   Medications Ordered in ED Medications - No data to display  ED Course  I have reviewed the triage vital signs and the nursing notes.  Pertinent labs & imaging  results that were available during my care of the patient were reviewed by me and considered in my medical decision making (see chart for details).  Clinical Course as of 06/23/21 1506  Sat Jun 23, 2021  1340 Patient is hypoxic here down to 87% on room air. Now on 4 L to maintain 02 above 90 %. She denies any worsening SOB above her baseline. She has chronic exertional dyspnea. She denies any weight gain and does not wear oxygen at home. [AH]  1433 ED ECG REPORT   Date: 06/23/2021  Rate: 71  Rhythm: normal sinus rhythm  QRS Axis: normal  Intervals: normal  ST/T Wave abnormalities: nonspecific T wave changes  Conduction Disutrbances:none  Narrative Interpretation:   [AH]    Clinical Course User Index [AH] , , PA-C   MDM Rules/Calculators/A&P                         65-year-old female with a history of COPD, chronic tobacco abuse here with hypoxic respiratory failure.  This appears to be new in onset.  Patient here with peripheral edema.  Peripheral edema differential diagnosis includes venous insufficiency, DVT, CHF.  Shortness of breath may be secondary to COPD exacerbation, CHF new onset, PE.  Patient has no   active chest pain.  I ordered and reviewed labs that include CBC without significant abnormality, BMP shows mildly elevated creatinine just above normal and improved from previous.  D-dimer is elevated.  Patient's 1 view chest x-ray shows no acute abnormalities and CT angiogram is pending.  EKG shows normal sinus rhythm at a rate of 71.  Patient signout given to Dr. Tamsen Snider at shift change. Final Clinical Impression(s) / ED Diagnoses Final diagnoses:  None    Rx / DC Orders ED Discharge Orders     None        Margarita Mail, PA-C 06/23/21 1513    Pattricia Boss, MD 06/24/21 [phone]

## 2021-06-23 NOTE — ED Provider Notes (Signed)
  Physical Exam  BP 96/66 (BP Location: Right Arm)   Pulse 75   Temp 98.4 F (36.9 C) (Oral)   Resp 18   SpO2 95%    ED Course/Procedures   Clinical Course as of 06/23/21 1730  Sat Jun 23, 2021  1340 Patient is hypoxic here down to 87% on room air. Now on 4 L to maintain 02 above 90 %. She denies any worsening SOB above her baseline. She has chronic exertional dyspnea. She denies any weight gain and does not wear oxygen at home. [AH]  1433 ED ECG REPORT   Date: 06/23/2021  Rate: 71  Rhythm: normal sinus rhythm  QRS Axis: normal  Intervals: normal  ST/T Wave abnormalities: nonspecific T wave changes  Conduction Disutrbances:none  Narrative Interpretation:   [AH]    Clinical Course User Index [AH] Margarita Mail, PA-C   Procedures  MDM   This is a 65 year old female with long smoking history with known emphysema who presents with bilateral leg swelling.  This is acute on chronic but bilateral feet have become more swollen and painful for the last day.  Has not crackles on lung exam and new O2 requirement on 4 L here with hypoxia to 87% on room air.  D-dimer elevated but CTA without evidence of acute PE.  Patient has no chest pain and nonischemic EKG, low suspicion for ACS. No evidence of pneumonia, pneumothorax, lung mass, or volume overload to account for new O2 requirement. May be chronically hypoxic and benefit from home O2. Will admit for new O2 requirement and further evaluation.         Coralee Pesa, MD 06/23/21 1732    Drenda Freeze, MD 06/23/21 [phone]

## 2021-06-24 ENCOUNTER — Observation Stay (HOSPITAL_BASED_OUTPATIENT_CLINIC_OR_DEPARTMENT_OTHER): Payer: Medicare Other

## 2021-06-24 DIAGNOSIS — J439 Emphysema, unspecified: Secondary | ICD-10-CM

## 2021-06-24 DIAGNOSIS — M7989 Other specified soft tissue disorders: Secondary | ICD-10-CM | POA: Diagnosis not present

## 2021-06-24 DIAGNOSIS — R0602 Shortness of breath: Secondary | ICD-10-CM | POA: Diagnosis not present

## 2021-06-24 DIAGNOSIS — R0902 Hypoxemia: Secondary | ICD-10-CM | POA: Diagnosis not present

## 2021-06-24 DIAGNOSIS — R0609 Other forms of dyspnea: Secondary | ICD-10-CM | POA: Diagnosis not present

## 2021-06-24 DIAGNOSIS — R609 Edema, unspecified: Secondary | ICD-10-CM

## 2021-06-24 DIAGNOSIS — J449 Chronic obstructive pulmonary disease, unspecified: Secondary | ICD-10-CM | POA: Diagnosis not present

## 2021-06-24 LAB — POCT I-STAT 7, (LYTES, BLD GAS, ICA,H+H)
Acid-Base Excess: 1 mmol/L (ref 0.0–2.0)
Bicarbonate: 26.5 mmol/L (ref 20.0–28.0)
Calcium, Ion: 1.17 mmol/L (ref 1.15–1.40)
HCT: 36 % (ref 36.0–46.0)
Hemoglobin: 12.2 g/dL (ref 12.0–15.0)
O2 Saturation: 89 %
Patient temperature: 98.6
Potassium: 3.8 mmol/L (ref 3.5–5.1)
Sodium: 142 mmol/L (ref 135–145)
TCO2: 28 mmol/L (ref 22–32)
pCO2 arterial: 46.6 mmHg (ref 32.0–48.0)
pH, Arterial: 7.363 (ref 7.350–7.450)
pO2, Arterial: 59 mmHg — ABNORMAL LOW (ref 83.0–108.0)

## 2021-06-24 LAB — BASIC METABOLIC PANEL
Anion gap: 7 (ref 5–15)
BUN: 12 mg/dL (ref 8–23)
CO2: 28 mmol/L (ref 22–32)
Calcium: 8.6 mg/dL — ABNORMAL LOW (ref 8.9–10.3)
Chloride: 107 mmol/L (ref 98–111)
Creatinine, Ser: 0.93 mg/dL (ref 0.44–1.00)
GFR, Estimated: >60 mL/min (ref >60)
Glucose, Bld: 122 mg/dL — ABNORMAL HIGH (ref 70–99)
Potassium: 3.3 mmol/L — ABNORMAL LOW (ref 3.5–5.1)
Sodium: 142 mmol/L (ref 135–145)

## 2021-06-24 LAB — RESP PANEL BY RT-PCR (FLU A&B, COVID) ARPGX2
Influenza A by PCR: NEGATIVE
Influenza B by PCR: NEGATIVE
SARS Coronavirus 2 by RT PCR: NEGATIVE

## 2021-06-24 LAB — ECHOCARDIOGRAM COMPLETE: Area-P 1/2: 2.51 cm2

## 2021-06-24 MED ORDER — POTASSIUM CHLORIDE CRYS ER 20 MEQ PO TBCR
40.0000 meq | EXTENDED_RELEASE_TABLET | Freq: Once | ORAL | Status: AC
  Administered 2021-06-24: 40 meq via ORAL
  Filled 2021-06-24: qty 2

## 2021-06-24 MED ORDER — ALBUTEROL SULFATE (2.5 MG/3ML) 0.083% IN NEBU: 2.5000 mg | INHALATION_SOLUTION | RESPIRATORY_TRACT | 2 refills | Status: DC | PRN

## 2021-06-24 MED ORDER — NICOTINE 14 MG/24HR TD PT24: 14.0000 mg | MEDICATED_PATCH | Freq: Once | TRANSDERMAL | 0 refills | Status: AC

## 2021-06-24 MED ORDER — ACETAMINOPHEN 325 MG PO TABS
650.0000 mg | ORAL_TABLET | Freq: Four times a day (QID) | ORAL | Status: DC | PRN
  Administered 2021-06-24: 650 mg via ORAL
  Filled 2021-06-24: qty 2

## 2021-06-24 NOTE — Discharge Summary (Signed)
Physician Discharge Summary  Jill Phillips FFM:[id number] DOB: 09/11/1955 DOA: 06/23/2021  PCP: [name], MD  Admit date: 06/23/2021 Discharge date: 06/24/2021  Admitted From: home Discharge disposition: home   Recommendations for Outpatient Follow-Up:   Smoking cessation Qualified for O2 Consider stopping norvasc as can cause LE edema Low Na diet recommended Pulmonology follow up?-- was seen in 2018   Discharge Diagnosis:   Active Problems:   Hypoxia    Discharge Condition: Improved.  Diet recommendation: Low sodium, heart healthy  Wound care: None.  Code status: Full.   History of Present Illness:   Jill Phillips is a 65 y.o. female with medical history significant of COPD Gold stage I 2017, HTN, lumbar degeneration status post multiple surgeries and on narcotics chronically, chronic ambulation dysfunction, cigar smoker, anxiety/depression, restless leg syndrome, presented with worsening of shortness of breath.   Patient came in with complaining about bilateral leg pain and swelling for 1 week.  Denies any recent long distance travel.  She has very limited exercise tolerance, which she attributed largely to her chronic back pains, but when she ambulate, she soon feel out of breath easily.  She uses albuterol and Spiriva occasionally.  Denies any fever chills.  She also reported her legs swelling last night, but resolved today.   Hospital Course by Problem:   Acute, probably on chronic at baseline, hypoxic respiratory failure -Likely secondary to advanced COPD. -qualified for home O2- dme ordered -see echo below   Peripheral edema -none seen -echo done and is unremarkable   COPD -No wheezing Needs to stop smoking and Educated patient to use Spiriva regularly instead of as needed.   Restless leg syndrome -Continue ropinirole, Klonopin. -check Fe as an outpatient   HTN -resume home meds -consider stopping norvasc as can cause LE edema  (patient had NO LE edema on exam today)   Cigarette smoker -Educated patient to quit smoking.   Anxiety/depression -Continue SSRI   Hypothyroidism -Continue Synthroid.    Medical Consultants:      Discharge Exam:   Vitals:   06/24/21 0837 06/24/21 1228  BP: (!) 142/66 116/69  Pulse: 76 62  Resp: 14 12  Temp: 99 F (37.2 C) 98 F (36.7 C)  SpO2:  97%   Vitals:   06/24/21 0615 06/24/21 0751 06/24/21 0837 06/24/21 1228  BP: 124/69  (!) 142/66 116/69  Pulse: 73  76 62  Resp: _0 Temp: 99.1 F (37.3 C)  99 F (37.2 C) 98 F (36.7 C)  TempSrc: Oral  Oral Oral  SpO2: 92% 93%  97%    General exam: Appears calm and comfortable.   The results of significant diagnostics from this hospitalization (including imaging, microbiology, ancillary and laboratory) are listed below for reference.     Procedures and Diagnostic Studies:   CT Angio Chest PE W and/or Wo Contrast  Result Date: 06/23/2021 CLINICAL DATA:  PE suspected, low/intermediate prob, positive D-dimer EXAM: CT ANGIOGRAPHY CHEST WITH CONTRAST TECHNIQUE: Multidetector CT imaging of the chest was performed using the standard protocol during bolus administration of intravenous contrast. Multiplanar CT image reconstructions and MIPs were obtained to evaluate the vascular anatomy. CONTRAST:  32m OMNIPAQUE IOHEXOL 350 MG/ML SOLN COMPARISON:  November 23, 2014 FINDINGS: Evaluation is limited secondary to positioning and streak artifact. Cardiovascular: Satisfactory opacification of the pulmonary arteries to the segmental level. No evidence of pulmonary embolism. Heart is the upper limits of normal in size. LEFT-sided coronary  artery atherosclerotic calcifications. Mild atherosclerotic calcifications of the aorta. No pericardial effusion. Mediastinum/Nodes: Visualized thyroid is unremarkable. There is a prominent preaortic lymph node which measures 10 mm in the short axis, previously 8 mm (series 7, image 120). No axillary  adenopathy. Lungs/Pleura: No pleural effusion or pneumothorax. Bilateral dependent consolidative enhancing opacities most consistent with atelectasis. Mild to moderate centrilobular emphysema. Mild bronchial wall thickening. Upper Abdomen: No acute abnormality. Musculoskeletal: Status post LEFT shoulder arthroplasty. Status post posterior fixation the thoracolumbar spine. Review of the MIP images confirms the above findings. IMPRESSION: 1. No acute pulmonary embolism. 2. Mild to moderate centrilobular emphysema with mild bronchial wall thickening which could reflect bronchitis. Given underlying emphysema, recommend evaluation for candidacy for annual lung cancer screening CT. 3. Mildly prominent lymph node of the AP window, nonspecific and likely reactive. Aortic Atherosclerosis (ICD10-I70.0) and Emphysema (ICD10-J43.9). Electronically Signed   By: Valentino Saxon M.D.   On: 06/23/2021 15:56   DG Chest Port 1 View  Result Date: 06/23/2021 CLINICAL DATA:  Shortness of breath EXAM: PORTABLE CHEST 1 VIEW COMPARISON:  Chest radiograph dated 02/04/2021. FINDINGS: The patient is rotated to the right. The heart size and mediastinal contours are within normal limits. Both lungs are clear. Fixation hardware is seen in the spine and the patient is status post a left shoulder arthroplasty. Degenerative changes are seen in the spine. IMPRESSION: No active disease. Electronically Signed   By: Zerita Boers M.D.   On: 06/23/2021 14:21   ECHOCARDIOGRAM COMPLETE  Result Date: 06/24/2021    ECHOCARDIOGRAM REPORT   Patient Name:   Jill Phillips Date of Exam: 06/24/2021 Medical Rec #:  [id number]      Height:       63.0 in Accession #:    [id number]     Weight:       153.0 lb Date of Birth:  1956-01-05       BSA:          1.726 m Patient Age:    65 years       BP:           142/66 mmHg Patient Gender: F              HR:           73 bpm. Exam Location:  Inpatient Procedure: 2D Echo, Color Doppler and Cardiac Doppler  Indications:    R06.9 DOE  History:        Patient has no prior history of Echocardiogram examinations.                 COPD; Risk Factors:Hypertension, Diabetes, Dyslipidemia, Sleep                 Apnea and Current Smoker.  Sonographer:    Raquel Sarna Senior RDCS Referring Phys: [id number] Hilari Wethington [facility]  Sonographer Comments: Very poor parasternals due to COPD. IMPRESSIONS  1. Left ventricular ejection fraction, by estimation, is 60 to 65%. The left ventricle has normal function. The left ventricle has no regional wall motion abnormalities. Left ventricular diastolic parameters are indeterminate.  2. Right ventricular systolic function is normal. The right ventricular size is not well visualized.  3. The mitral valve is grossly normal. Trivial mitral valve regurgitation. No evidence of mitral stenosis.  4. The aortic valve was not well visualized. Aortic valve regurgitation is not visualized. No aortic stenosis is present.  5. Pulmonic valve regurgitation not assessed.  6. Aortic Normal visualized descending aorta.  7. The  inferior vena cava is normal in size with greater than 50% respiratory variability, suggesting right atrial pressure of 3 mmHg. Comparison(s): No prior Echocardiogram. Conclusion(s)/Recommendation(s): Otherwise normal echocardiogram, with minor abnormalities described in the report. Limited parasternal images, suspect due to COPD based on image pattern. FINDINGS  Left Ventricle: Left ventricular ejection fraction, by estimation, is 60 to 65%. The left ventricle has normal function. The left ventricle has no regional wall motion abnormalities. The left ventricular internal cavity size was normal in size. There is  no left ventricular hypertrophy. Left ventricular diastolic parameters are indeterminate. Right Ventricle: The right ventricular size is not well visualized. Right vetricular wall thickness was not well visualized. Right ventricular systolic function is normal. Left Atrium: Left atrial size was  normal in size. Right Atrium: Right atrial size was normal in size. Pericardium: There is no evidence of pericardial effusion. Presence of pericardial fat pad. Mitral Valve: The mitral valve is grossly normal. Mild to moderate mitral annular calcification. Trivial mitral valve regurgitation. No evidence of mitral valve stenosis. Tricuspid Valve: The tricuspid valve is grossly normal. Tricuspid valve regurgitation is trivial. No evidence of tricuspid stenosis. Aortic Valve: The aortic valve was not well visualized. Aortic valve regurgitation is not visualized. No aortic stenosis is present. Pulmonic Valve: The pulmonic valve was not well visualized. Pulmonic valve regurgitation not assessed. Aorta: The ascending aorta was not well visualized, the aortic root was not well visualized, Normal visualized descending aorta and the aortic arch was not well visualized. Venous: The inferior vena cava is normal in size with greater than 50% respiratory variability, suggesting right atrial pressure of 3 mmHg. IAS/Shunts: The atrial septum is grossly normal.   Diastology LV e' medial:    6.42 cm/s LV E/e' medial:  12.4 LV e' lateral:   4.13 cm/s LV E/e' lateral: 19.2  RIGHT VENTRICLE RV S prime:     8.16 cm/s TAPSE (M-mode): 2.0 cm LEFT ATRIUM             Index        RIGHT ATRIUM           Index LA Vol (A2C):   41.8 ml 24.22 ml/m  RA Area:     13.00 cm LA Vol (A4C):   44.0 ml 25.50 ml/m  RA Volume:   28.20 ml  16.34 ml/m LA Biplane Vol: 45.1 ml 26.14 ml/m  AORTIC VALVE LVOT Vmax:   118.00 cm/s LVOT Vmean:  82.900 cm/s LVOT VTI:    0.232 m MITRAL VALVE MV Area (PHT): 2.51 cm    SHUNTS MV Decel Time: 302 msec    Systemic VTI: 0.23 m MV E velocity: 79.30 cm/s MV A velocity: 84.70 cm/s MV E/A ratio:  0.94 Buford Dresser MD Electronically signed by Buford Dresser MD Signature Date/Time: 06/24/2021/1:13:17 PM    Final    VAS [place] LOWER EXTREMITY VENOUS (DVT)  Result Date: 06/24/2021  Lower Venous DVT Study  Patient Name:  DEAZIA LAMPI  Date of Exam:   06/24/2021 Medical Rec #: [id number]       Accession #:    [id number] Date of Birth: 06/20/1956        Patient Gender: F Patient Age:   67 years Exam Location:  [facility] Procedure:      VAS [place] LOWER EXTREMITY VENOUS (DVT) Referring Phys: Wynetta Fines --------------------------------------------------------------------------------  Indications: Swelling, and Edema.  Comparison Study: no prior Performing Technologist: Archie Patten RVS  Examination Guidelines: A complete evaluation includes B-mode imaging, spectral  Doppler, color Doppler, and power Doppler as needed of all accessible portions of each vessel. Bilateral testing is considered an integral part of a complete examination. Limited examinations for reoccurring indications may be performed as noted. The reflux portion of the exam is performed with the patient in reverse Trendelenburg.  +---------+---------------+---------+-----------+----------+--------------+ RIGHT    CompressibilityPhasicitySpontaneityPropertiesThrombus Aging +---------+---------------+---------+-----------+----------+--------------+ CFV      Full           Yes      Yes                                 +---------+---------------+---------+-----------+----------+--------------+ SFJ      Full                                                        +---------+---------------+---------+-----------+----------+--------------+ FV Prox  Full                                                        +---------+---------------+---------+-----------+----------+--------------+ FV Mid   Full                                                        +---------+---------------+---------+-----------+----------+--------------+ FV DistalFull                                                        +---------+---------------+---------+-----------+----------+--------------+ PFV      Full                                                         +---------+---------------+---------+-----------+----------+--------------+ POP      Full           Yes      Yes                                 +---------+---------------+---------+-----------+----------+--------------+ PTV      Full                                                        +---------+---------------+---------+-----------+----------+--------------+ PERO     Full                                                        +---------+---------------+---------+-----------+----------+--------------+   +---------+---------------+---------+-----------+----------+--------------+  LEFT     CompressibilityPhasicitySpontaneityPropertiesThrombus Aging +---------+---------------+---------+-----------+----------+--------------+ CFV      Full           Yes      Yes                                 +---------+---------------+---------+-----------+----------+--------------+ SFJ      Full                                                        +---------+---------------+---------+-----------+----------+--------------+ FV Prox  Full                                                        +---------+---------------+---------+-----------+----------+--------------+ FV Mid   Full                                                        +---------+---------------+---------+-----------+----------+--------------+ FV DistalFull                                                        +---------+---------------+---------+-----------+----------+--------------+ PFV      Full                                                        +---------+---------------+---------+-----------+----------+--------------+ POP      Full           Yes      Yes                                 +---------+---------------+---------+-----------+----------+--------------+ PTV      Full                                                         +---------+---------------+---------+-----------+----------+--------------+ PERO     Full                                                        +---------+---------------+---------+-----------+----------+--------------+     Summary: BILATERAL: - No evidence of deep vein thrombosis seen in the lower extremities, bilaterally. -No evidence of popliteal cyst, bilaterally.   *See table(s) above for measurements and observations. Electronically signed by Deitra Mayo MD on 06/24/2021 at 1:30:29 PM.  Final      Labs:   Basic Metabolic Panel: Recent Labs  Lab 06/23/21 1141 06/23/21 1713 06/24/21 0221  NA 142 142 142  K 4.3 3.8 3.3*  CL 108  --  107  CO2 24  --  28  GLUCOSE 90  --  122*  BUN 11  --  12  CREATININE 1.12*  --  0.93  CALCIUM 8.8*  --  8.6*   GFR CrCl cannot be calculated (Unknown ideal weight.). Liver Function Tests: Recent Labs  Lab 06/23/21 1344  AST 14*  ALT 10  ALKPHOS 119  BILITOT 0.5  PROT 6.4*  ALBUMIN 3.5   No results for input(s): LIPASE, AMYLASE in the last 168 hours. No results for input(s): AMMONIA in the last 168 hours. Coagulation profile No results for input(s): INR, PROTIME in the last 168 hours.  CBC: Recent Labs  Lab 06/23/21 1141 06/23/21 1713  WBC 8.5  --   HGB 12.9 12.2  HCT 42.3 36.0  MCV 101.9*  --   PLT 245  --    Cardiac Enzymes: No results for input(s): CKTOTAL, CKMB, CKMBINDEX, TROPONINI in the last 168 hours. BNP: Invalid input(s): POCBNP CBG: No results for input(s): GLUCAP in the last 168 hours. D-Dimer Recent Labs    06/23/21 1344  DDIMER 0.98*   Hgb A1c No results for input(s): HGBA1C in the last 72 hours. Lipid Profile No results for input(s): CHOL, HDL, LDLCALC, TRIG, CHOLHDL, LDLDIRECT in the last 72 hours. Thyroid function studies No results for input(s): TSH, T4TOTAL, T3FREE, THYROIDAB in the last 72 hours.  Invalid input(s): FREET3 Anemia work up No results for input(s): VITAMINB12,  FOLATE, FERRITIN, TIBC, IRON, RETICCTPCT in the last 72 hours. Microbiology Recent Results (from the past 240 hour(s))  Resp Panel by RT-PCR (Flu A&B, Covid) Nasopharyngeal Swab     Status: None   Collection Time: 06/24/21 11:24 AM   Specimen: Nasopharyngeal Swab; Nasopharyngeal(NP) swabs in vial transport medium  Result Value Ref Range Status   SARS Coronavirus 2 by RT PCR NEGATIVE NEGATIVE Final    Comment: (NOTE) SARS-CoV-2 target nucleic acids are NOT DETECTED.  The SARS-CoV-2 RNA is generally detectable in upper respiratory specimens during the acute phase of infection. The lowest concentration of SARS-CoV-2 viral copies this assay can detect is 138 copies/mL. A negative result does not preclude SARS-Cov-2 infection and should not be used as the sole basis for treatment or other patient management decisions. A negative result may occur with  improper specimen collection/handling, submission of specimen other than nasopharyngeal swab, presence of viral mutation(s) within the areas targeted by this assay, and inadequate number of viral copies(<138 copies/mL). A negative result must be combined with clinical observations, patient history, and epidemiological information. The expected result is Negative.  Fact Sheet for Patients:  EntrepreneurPulse.com.au  Fact Sheet for Healthcare Providers:  IncredibleEmployment.be  This test is no t yet approved or cleared by the [place] FDA and  has been authorized for detection and/or diagnosis of SARS-CoV-2 by FDA under an Emergency Use Authorization (EUA). This EUA will remain  in effect (meaning this test can be used) for the duration of the COVID-19 declaration under Section 564(b)(1) of the Act, 21 U.S.C.section 360bbb-3(b)(1), unless the authorization is terminated  or revoked sooner.       Influenza A by PCR NEGATIVE NEGATIVE Final   Influenza B by PCR NEGATIVE NEGATIVE Final    Comment:  (NOTE) The Xpert Xpress SARS-CoV-2/FLU/RSV plus assay is intended as  an aid in the diagnosis of influenza from Nasopharyngeal swab specimens and should not be used as a sole basis for treatment. Nasal washings and aspirates are unacceptable for Xpert Xpress SARS-CoV-2/FLU/RSV testing.  Fact Sheet for Patients: EntrepreneurPulse.com.au  Fact Sheet for Healthcare Providers: IncredibleEmployment.be  This test is not yet approved or cleared by the [place] FDA and has been authorized for detection and/or diagnosis of SARS-CoV-2 by FDA under an Emergency Use Authorization (EUA). This EUA will remain in effect (meaning this test can be used) for the duration of the COVID-19 declaration under Section 564(b)(1) of the Act, 21 U.S.C. section 360bbb-3(b)(1), unless the authorization is terminated or revoked.  Performed at [facility], [place] [address], [place], [place] [address]      Discharge Instructions:   Discharge Instructions     Diet - low sodium heart healthy   Complete by: As directed    For home use only DME Nebulizer machine   Complete by: As directed    Patient needs a nebulizer to treat with the following condition: COPD (chronic obstructive pulmonary disease) ([place])   Length of Need: Lifetime   Increase activity slowly   Complete by: As directed       Allergies as of 06/24/2021       Reactions   Flagyl [metronidazole] Diarrhea   Limonene Itching   Yeast infection in mouth   Sulfa Antibiotics Other (See Comments)   Yeast infection in mouth   Doxycycline    Mouth soreness - reaction vs thrush?   Amoxicillin Other (See Comments)   REACTION: Oral yeast infection Has patient had a PCN reaction causing immediate rash, facial/tongue/throat swelling, SOB or lightheadedness with hypotension: No Has patient had a PCN reaction causing severe rash involving mucus membranes or skin necrosis: No Has patient had a PCN reaction  that required hospitalization No Has patient had a PCN reaction occurring within the last 10 years: No If all of the above answers are "NO", then may proceed with Cephalosporin use.   Chlorzoxazone Other (See Comments)   headache   Codeine Other (See Comments)   headache   Darvocet [propoxyphene N-acetaminophen] Itching   Dilaudid [hydromorphone Hcl] Itching   Keflex [cephalexin] Other (See Comments)   Pt does not recall reaction (maybe yeast infection)   Morphine And Related Itching   Nitrofurantoin Monohyd Macro Hives   Reaction to [employer] [oxycodone-acetaminophen] Itching   Patient can tolerate Acetaminophen solely   Trazodone And Nefazodone Palpitations        Medication List     STOP taking these medications    cimetidine 400 MG tablet Commonly known as: TAGAMET       TAKE these medications    acetaminophen 500 MG tablet Commonly known as: TYLENOL Take 1,000 mg by mouth every 6 (six) hours as needed (for headaches.).   albuterol 108 (90 Base) MCG/ACT inhaler Commonly known as: ProAir HFA INHALE 2 PUFFS INTO THE LUNGS EVERY 6 HOURS AS NEEDED FOR WHEEZING OR SHORTNESS OF BREATH. What changed:  how much to take how to take this when to take this reasons to take this additional instructions   albuterol (2.5 MG/3ML) 0.083% nebulizer solution Commonly known as: PROVENTIL Take 3 mLs (2.5 mg total) by nebulization every 4 (four) hours as needed for wheezing or shortness of breath. What changed: You were already taking a medication with the same name, and this prescription was added. Make sure you understand how and when to take each.  amLODipine-benazepril 10-40 MG capsule Commonly known as: LOTREL TAKE 1 CAPSULE BY MOUTH DAILY.   atorvastatin 20 MG tablet Commonly known as: LIPITOR TAKE 1 TABLET BY MOUTH DAILY.   clonazePAM 1 MG tablet Commonly known as: KLONOPIN TAKE 1 TABLET BY MOUTH 2 TIMES DAILY AS NEEDED FOR ANXIETY. What changed:  reasons to take this   docusate sodium 100 MG capsule Commonly known as: COLACE Take 300 mg by mouth at bedtime.   fentaNYL 12 MCG/HR Commonly known as: DURAGESIC 1 patch every 3 (three) days.   gabapentin 100 MG capsule Commonly known as: NEURONTIN Take 1 capsule (100 mg total) by mouth 2 (two) times daily.   HYDROcodone-acetaminophen 10-325 MG tablet Commonly known as: Norco Take 1 tablet by mouth every 6 (six) hours as needed for severe pain (Chronic back pain). What changed:  how much to take additional instructions   levothyroxine 88 MCG tablet Commonly known as: SYNTHROID TAKE 1 TABLET BY MOUTH DAILY. What changed: when to take this   Narcan 4 MG/0.1ML Liqd nasal spray kit Generic drug: naloxone Place 1 spray into the nose as needed (accidental overdose).   nebivolol 10 MG tablet Commonly known as: BYSTOLIC Take 1 tablet (10 mg total) by mouth daily.   nicotine 14 mg/24hr patch Commonly known as: NICODERM CQ - dosed in mg/24 hours Place 1 patch (14 mg total) onto the skin once for 1 dose.   omeprazole 40 MG capsule Commonly known as: PRILOSEC Take 1 capsule (40 mg total) by mouth 2 (two) times daily.   QUEtiapine 100 MG tablet Commonly known as: SEROQUEL TAKE 1 AND 1/2 TABLETS BY MOUTH AT BEDTIME. What changed: See the new instructions.   rOPINIRole 1 MG tablet Commonly known as: REQUIP TAKE 1 TO 2 TABLETS BY MOUTH TWO TIMES DAILY AS NEEDED What changed: See the new instructions.   solifenacin 10 MG tablet Commonly known as: VESICARE Take 10 mg by mouth daily.   Spiriva HandiHaler 18 MCG inhalation capsule Generic drug: tiotropium Place 1 capsule (18 mcg total) into inhaler and inhale daily.   tiZANidine 4 MG tablet Commonly known as: ZANAFLEX TAKE 1 TABLET BY MOUTH EVERY 8 HOURS AS NEEDED What changed: reasons to take this   valACYclovir 500 MG tablet Commonly known as: VALTREX Take 1 tablet (500 mg total) by mouth daily.   venlafaxine XR  150 MG 24 hr capsule Commonly known as: EFFEXOR-XR TAKE 2 CAPSULES BY MOUTH DAILY. * What changed: See the new instructions.               Durable Medical Equipment  (From admission, onward)           Start     Ordered   06/24/21 1246  For home use only DME oxygen  Once       Question Answer Comment  Length of Need Lifetime   Mode or (Route) Nasal cannula   Liters per Minute 2   Oxygen delivery system Gas      06/24/21 1246   06/23/21 0000  For home use only DME Nebulizer machine       Question Answer Comment  Patient needs a nebulizer to treat with the following condition COPD (chronic obstructive pulmonary disease) ([place])   Length of Need Lifetime      06/23/21 1738            Follow-up Information     [facility] Follow up.   Why: For home oxygen. Contact information: [place]  [facility] [place] [address] [phone]         [name], MD Follow up in 1 week(s).   Specialty: Internal Medicine Contact information: [place] [address] [phone]                  Time coordinating discharge: 25 min  Signed:  Geradine Girt DO  [facility] 06/24/2021, 2:21 PM

## 2021-06-24 NOTE — Progress Notes (Signed)
SATURATION QUALIFICATIONS: (This note is used to comply with regulatory documentation for home oxygen)  Patient Saturations on Room Air at Rest = 94%  Patient Saturations on Room Air while Ambulating = 87%  Patient Saturations on 2 Liters of oxygen while Ambulating = 90%  Please briefly explain why patient needs home oxygen: Pt. Oxygen saturation level is 87% while ambulating.

## 2021-06-24 NOTE — Progress Notes (Signed)
Lower extremity venous has been completed.   Preliminary results in CV Proc.   Jill Phillips 06/24/2021 9:58 AM

## 2021-06-24 NOTE — TOC Initial Note (Addendum)
Transition of Care [facility]) - Initial/Assessment Note    Patient Details  Name: Jill Phillips MRN: [id number] Date of Birth: [birth year]  [facility]) CM/SW Contact:    Carles Collet, RN Phone Number: 06/24/2021, 1:11 PM  Clinical Narrative:        Damaris Schooner w patient over the phone. She states that she does not currently have any home oxygen, nebulizer or other resp support.  New home oxygen set up is needed. Referral made to [employer]. They will deliver POC to room today. Patient states that she has RW at home that she uses PRN. She states that she has friend who will provide ride home at [place].  Ordered nebulizer to be delivered to room, patient understands to wait until it is delivered so she vcan take it home w her. Message sent to nurse.  No other TOC needs identified at this time, will continue to follow.             Expected Discharge Plan: Home/Self Care Barriers to Discharge: Continued Medical Work up   Patient Goals and CMS Choice Patient states their goals for this hospitalization and ongoing recovery are:: to go home      Expected Discharge Plan and Services Expected Discharge Plan: Home/Self Care   Discharge Planning Services: CM Consult Post Acute Care Choice: Durable Medical Equipment                   DME Arranged: Oxygen DME Agency: [employer] Date DME Agency Contacted: 06/24/21 Time DME Agency Contacted: 47 Representative spoke with at DME Agency: Freda Munro            Prior Living Arrangements/Services   Lives with:: Self                   Activities of Daily Living      Permission Sought/Granted                  Emotional Assessment              Admission diagnosis:  Hypoxia [R09.02] Pulmonary emphysema, unspecified emphysema type ([place]) [J43.9] Patient Active Problem List   Diagnosis Date Noted   AKI (acute kidney injury) ([place]) 02/04/2021   Overdose 02/04/2021   Acute gout 06/20/2020   Interstitial cystitis 01/21/2020    Status post shoulder surgery 09/09/2019   Nausea 08/19/2019   Anxiety 05/04/2019   Insomnia 05/04/2019   Syncope 12/08/2018   Disorder of rotator cuff syndrome of left shoulder and allied disorder 06/22/2018   Bilateral leg edema 06/04/2018   Type 2 diabetes mellitus without complication, without long-term current use of insulin ([place]) 12/02/2017   Sacral pain 09/17/2017   COPD (chronic obstructive pulmonary disease) ([place]) 04/21/2017   Lower back pain 03/03/2017   CKD (chronic kidney disease) stage 3, GFR 30-59 ml/min (HCC) 08/07/2016   GERD (gastroesophageal reflux disease) 08/07/2016   Chronic respiratory failure ([place]) 07/29/2016   Arthritis of carpometacarpal (CMC) joint of left thumb 07/09/2016   Pneumothorax on left 01/31/2016   Chronic, continuous use of opioids 09/06/2015   Degenerative disc disease, lumbar 08/01/2015    Class: Chronic   Spondylolisthesis of lumbar region 08/01/2015    Class: Chronic   Hypoxia 09/14/2014   Urinary, incontinence, stress female 05/06/2014   Constipation 05/05/2014   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/14/2013   HTN (hypertension) 05/19/2013   HLD (hyperlipidemia) 05/19/2013   Depression 05/19/2013   Hypothyroidism 05/19/2013   Tobacco abuse    Osteoarthritis of both  knees 11/18/2011   RLS (restless legs syndrome) 11/18/2011   PCP:  [name], MD Pharmacy:   [facility] [place] [address] Phone: [phone] Fax: [phone]  [place], [address] [place] [address] Phone: [phone] Fax: [phone]  [facility] [address] [place] [address] Phone: [phone] Fax: [phone]     Social Determinants of Health (SDOH) Interventions    Readmission Risk Interventions No flowsheet data found.

## 2021-06-24 NOTE — Progress Notes (Signed)
Echocardiogram 2D Echocardiogram has been performed.  Oneal Deputy Alyria Krack RDCS 06/24/2021, 10:09 AM

## 2021-06-25 ENCOUNTER — Other Ambulatory Visit: Payer: Self-pay | Admitting: Internal Medicine

## 2021-06-25 DIAGNOSIS — R0602 Shortness of breath: Secondary | ICD-10-CM | POA: Diagnosis not present

## 2021-06-25 DIAGNOSIS — J449 Chronic obstructive pulmonary disease, unspecified: Secondary | ICD-10-CM | POA: Diagnosis not present

## 2021-07-03 ENCOUNTER — Other Ambulatory Visit: Payer: Self-pay | Admitting: Internal Medicine

## 2021-07-05 ENCOUNTER — Ambulatory Visit (INDEPENDENT_AMBULATORY_CARE_PROVIDER_SITE_OTHER): Payer: Medicare Other | Admitting: Specialist

## 2021-07-05 ENCOUNTER — Other Ambulatory Visit: Payer: Self-pay

## 2021-07-05 ENCOUNTER — Encounter: Payer: Self-pay | Admitting: Specialist

## 2021-07-05 VITALS — BP 132/79 | HR 68 | Ht 63.0 in | Wt 153.0 lb

## 2021-07-05 DIAGNOSIS — M533 Sacrococcygeal disorders, not elsewhere classified: Secondary | ICD-10-CM

## 2021-07-05 DIAGNOSIS — M4325 Fusion of spine, thoracolumbar region: Secondary | ICD-10-CM | POA: Diagnosis not present

## 2021-07-05 DIAGNOSIS — M4807 Spinal stenosis, lumbosacral region: Secondary | ICD-10-CM

## 2021-07-05 DIAGNOSIS — M25559 Pain in unspecified hip: Secondary | ICD-10-CM | POA: Diagnosis not present

## 2021-07-05 DIAGNOSIS — M960 Pseudarthrosis after fusion or arthrodesis: Secondary | ICD-10-CM

## 2021-07-05 DIAGNOSIS — M545 Low back pain, unspecified: Secondary | ICD-10-CM | POA: Diagnosis not present

## 2021-07-05 DIAGNOSIS — G8929 Other chronic pain: Secondary | ICD-10-CM

## 2021-07-05 NOTE — Patient Instructions (Signed)
Avoid bending, stooping and avoid lifting weights greater than 10 lbs. Avoid prolong standing and walking. Order for a new walker with wheels. Surgery scheduling secretary Kandice Hams, will call you in the next week to schedule for surgery.  Surgery recommended is a left sided lumbar interbody fusion with cages and bone morphologic protein, revision of the hardware on the left side. this would be done with rods, screws and cages with local bone graft and allograft (donor bone graft). Take hydrocodone for for pain. Risk of surgery includes risk of infection 1 in 200 patients, bleeding 1/2% chance you would need a transfusion.   Risk to the nerves is one in 10,000. You will need to use a brace for 3 months and wean from the brace on the 4th month. Expect improved walking and standing tolerance. Expect relief of leg pain but numbness may persist depending on the length and degree of pressure that has been present.

## 2021-07-05 NOTE — Progress Notes (Signed)
Office Visit Note   Patient: Jill Phillips           Date of Birth: 1956-01-16           MRN: [id number] Visit Date: 07/05/2021              Requested by: [name], MD [place],  [place] [address] PCP: [name], MD   Assessment & Plan: Visit Diagnoses:  1. Spinal stenosis of lumbosacral region   2. 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 Low back pain, unspecified back pain laterality, unspecified chronicity, unspecified whether sciatica present   4. Fusion of spine of thoracolumbar region   5. Hip pain   6. Chronic left SI joint pain     Plan: Avoid bending, stooping and avoid lifting weights greater than 10 lbs. Avoid prolong standing and walking. Order for a new walker with wheels. Surgery scheduling secretary Kandice Hams, will call you in the next week to schedule for surgery.  Surgery recommended is a left sided lumbar interbody fusion with cages and bone morphologic protein, revision of the hardware on the left side. this would be done with rods, screws and cages with local bone graft and allograft (donor bone graft). Take hydrocodone for for pain. Risk of surgery includes risk of infection 1 in 200 patients, bleeding 1/2% chance you would need a transfusion.   Risk to the nerves is one in 10,000. You will need to use a brace for 3 months and wean from the brace on the 4th month. Expect improved walking and standing tolerance. Expect relief of leg pain but numbness may persist depending on the length and degree of pressure that has been present.  Follow-Up Instructions: Return in about 4 weeks (around 08/02/2021).   Orders:  Orders Placed This Encounter  Procedures   MR Lumbar Spine W Wo Contrast   No orders of the defined types were placed in this encounter.     Procedures: No procedures performed   Clinical Data: No additional findings.   Subjective: Chief Complaint  Patient presents with   Spine - Follow-up     3-phase bone scan review    65 year old female with history of thoracolumbar fusion T10 to S1. Persistent pain that is mainly in the mid lumbar level underwent nuclear medicine  Scan with increased uptake at the upper end of the hardware T8, T9 and T10 with increased uptake. In the lower area at the mid lumbar segment there L2-3. 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  Pain into the rectum. No bowel or bladder difficulty.   Review of Systems  Constitutional: Negative.   HENT: Negative.    Eyes: Negative.   Respiratory: Negative.    Cardiovascular: Negative.   Gastrointestinal: Negative.   Endocrine: Negative.   Genitourinary: Negative.   Musculoskeletal: Negative.   Skin: Negative.   Allergic/Immunologic: Negative.   Neurological: Negative.   Hematological: Negative.   Psychiatric/Behavioral: Negative.      Objective: Vital Signs: BP 132/79 (BP Location: Left Arm, Patient Position: Sitting)   Pulse 68   Ht 5\' 3"  (1.6 m)   Wt 153 lb (69.4 kg)   SpO2 92%   BMI 27.10 kg/m   Physical Exam Constitutional:      Appearance: She is well-developed.  HENT:     Head: Normocephalic and atraumatic.  Eyes:     Pupils: Pupils are equal, round, and reactive to light.  Pulmonary:  Effort: Pulmonary effort is normal.     Breath sounds: Normal breath sounds.  Abdominal:     General: Bowel sounds are normal.     Palpations: Abdomen is soft.  Musculoskeletal:     Cervical back: Normal range of motion and neck supple.  Skin:    General: Skin is warm and dry.  Neurological:     Mental Status: She is alert and oriented to person, place, and time.  Psychiatric:        Behavior: Behavior normal.        Thought Content: Thought content normal.        Judgment: Judgment normal.   Back Exam   Tenderness  The patient is experiencing tenderness in the lumbar.  Range of Motion  Lateral bend right:  abnormal  Lateral bend left:  abnormal  Rotation right:  abnormal    Muscle Strength  Right Quadriceps:  5/5  Left Quadriceps:  5/5  Right Hamstrings:  5/5  Left Hamstrings:  5/5   Other  Heel walk: normal    Specialty Comments:  No specialty comments available.  Imaging: No results found.   PMFS History: Patient Active Problem List   Diagnosis Date Noted   Degenerative disc disease, lumbar 08/01/2015    Priority: High    Class: Chronic   Spondylolisthesis of lumbar region 08/01/2015    Priority: High    Class: Chronic   AKI (acute kidney injury) ([place]) 02/04/2021   Overdose 02/04/2021   Acute gout 06/20/2020   Interstitial cystitis 01/21/2020   Status post shoulder surgery 09/09/2019   Nausea 08/19/2019   Anxiety 05/04/2019   Insomnia 05/04/2019   Syncope 12/08/2018   Disorder of rotator cuff syndrome of left shoulder and allied disorder 06/22/2018   Bilateral leg edema 06/04/2018   Type 2 diabetes mellitus without complication, without long-term current use of insulin ([place]) 12/02/2017   Sacral pain 09/17/2017   COPD (chronic obstructive pulmonary disease) ([place]) 04/21/2017   Lower back pain 03/03/2017   CKD (chronic kidney disease) stage 3, GFR 30-59 ml/min (HCC) 08/07/2016   GERD (gastroesophageal reflux disease) 08/07/2016   Chronic respiratory failure ([place]) 07/29/2016   Arthritis of carpometacarpal (CMC) joint of left thumb 07/09/2016   Pneumothorax on left 01/31/2016   Chronic, continuous use of opioids 09/06/2015   Hypoxia 09/14/2014   Urinary, incontinence, stress female 05/06/2014   Constipation 05/05/2014   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/14/2013   HTN (hypertension) 05/19/2013   HLD (hyperlipidemia) 05/19/2013   Depression 05/19/2013   Hypothyroidism 05/19/2013   Tobacco abuse    Osteoarthritis of both knees 11/18/2011   RLS (restless legs syndrome) 11/18/2011   Past Medical History:  Diagnosis Date   Active smoker    Anxiety    Calcifying tendinitis of shoulder    Chronic back pain    Chronic pain syndrome    COPD  (chronic obstructive pulmonary disease) (HCC)    Dysthymic disorder    Emphysema lung (HCC)    GERD (gastroesophageal reflux disease)    Headache    "weekly maybe" (01/31/2016)   Heart murmur    for years, nothing to be concerned about   Herpes genitalia    History of blood transfusion 1980   related to "back surgery"   Hyperlipidemia    Hypertension    Hypothyroidism    Lumbago    Osteoarthrosis, unspecified whether generalized or localized, lower leg    Pain in joint, upper arm    Pneumothorax, left 01/31/2016   S/P  Left posterior subcostal pain injection on 01/30/2016   PONV (postoperative nausea and vomiting)    gets nauseous  with longer surgery. Difficuty voiding after surgery   Postlaminectomy syndrome, thoracic region    Primary localized osteoarthrosis, lower leg    Restless leg syndrome    Sleep apnea    s/p surgery- last sleep study 2011- doesnt use oxygen or machine at night as instructed,.   12/2014- Dr Halford Chessman  reports it is negative.   Thyroid disease     Family History  Problem Relation Age of Onset   Kidney disease Mother    Heart disease Father    Anuerysm Brother 77       brain   Heart disease Brother    Heart disease Sister 79       s/p CABG   Hypertension Sister    Colon cancer Neg Hx     Past Surgical History:  Procedure Laterality Date   APPENDECTOMY     BACK SURGERY     18 back surgeries (2 thoracic & 16 lumbar) (01/31/2016)   DILATION AND CURETTAGE OF UTERUS     HAMMER TOE SURGERY     IR RADIOLOGIST EVAL & MGMT  07/21/2017   JOINT REPLACEMENT     KNEE ARTHROSCOPY Right    LAPAROSCOPIC CHOLECYSTECTOMY     LUMBAR FUSION N/A 08/01/2015   Procedure: Right sided L1-2 and L2-3 transforaminal lumbar interbody fusion with cages, Extension of posterior fusion T12 to L3, Replaced pedicle screws bilaterally L1-L2 , Replaced left sided pedicle screws L-3. Instrumentation T12 to L3 using local bone graft, Vivigen allograft and cancellous chips;  Surgeon: [name], MD;  Location: [facility];  Service: Orthopedics;  Laterality: N/A;   LUMBAR LAMINECTOMY/DECOMPRESSION MICRODISCECTOMY N/A 01/28/2014   Procedure: Minimally Invasive Right  L1-2 Microdiscectomy;  Surgeon: [name], MD;  Location: [facility];  Service: Orthopedics;  Laterality: N/A;   REVERSE SHOULDER ARTHROPLASTY Left 09/09/2019   Procedure: LEFT REVERSE SHOULDER REPLACEMENT;  Surgeon: Meredith Pel, MD;  Location: [facility];  Service: Orthopedics;  Laterality: Left;   TOTAL HIP ARTHROPLASTY Right    TOTAL KNEE ARTHROPLASTY  05/29/2012   Procedure: TOTAL KNEE ARTHROPLASTY;  Surgeon: Mcarthur Rossetti, MD;  Location: [facility];  Service: Orthopedics;  Laterality: Right;  Right Total Knee Arthroplasty   TUBAL LIGATION     UVULOPALATOPHARYNGOPLASTY     VAGINAL HYSTERECTOMY     Social History   Occupational History   Occupation: disability    Comment: back surgeries  Tobacco Use   Smoking status: Every Day    Packs/day: 1.25    Years: 45.00    Pack years: 56.25    Types: Cigarettes   Smokeless tobacco: Never   Tobacco comments:    form given 12/25/16  Vaping Use   Vaping Use: Never used  Substance and Sexual Activity   Alcohol use: No    Alcohol/week: 0.0 standard drinks   Drug use: No    Types: Marijuana    Comment: 01/31/2016 "none since ~ 1980"   Sexual activity: Never    Partners: Male

## 2021-07-06 ENCOUNTER — Other Ambulatory Visit (HOSPITAL_COMMUNITY): Payer: Self-pay

## 2021-07-11 ENCOUNTER — Telehealth: Payer: Self-pay | Admitting: Internal Medicine

## 2021-07-11 DIAGNOSIS — E038 Other specified hypothyroidism: Secondary | ICD-10-CM

## 2021-07-11 DIAGNOSIS — E119 Type 2 diabetes mellitus without complications: Secondary | ICD-10-CM

## 2021-07-11 DIAGNOSIS — N1832 Chronic kidney disease, stage 3b: Secondary | ICD-10-CM

## 2021-07-11 DIAGNOSIS — I10 Essential (primary) hypertension: Secondary | ICD-10-CM

## 2021-07-11 NOTE — Telephone Encounter (Signed)
Patient states she is having kidney function health concerns   Patient is requesting to have labs done prior to her appt on 07-17-2021

## 2021-07-12 NOTE — Telephone Encounter (Signed)
Blood work ordered.

## 2021-07-12 NOTE — Telephone Encounter (Signed)
Left message for patient to return call to set up lab appointment prior to her OV on 11/15.  If she calls back please schedule.  Thanks

## 2021-07-13 ENCOUNTER — Other Ambulatory Visit: Payer: Self-pay | Admitting: Internal Medicine

## 2021-07-15 ENCOUNTER — Encounter: Payer: Self-pay | Admitting: Internal Medicine

## 2021-07-15 NOTE — Patient Instructions (Signed)
  Blood work was ordered.     Medications changes include :     Your prescription(s) have been submitted to your pharmacy. Please take as directed and contact our office if you believe you are having problem(s) with the medication(s).   A referral was ordered for        Someone from their office will call you to schedule an appointment.    Please followup in 6 months  

## 2021-07-15 NOTE — Progress Notes (Signed)
Subjective:    Patient ID: Jill Phillips, female    DOB: [birth year], 65 y.o.   MRN: [id number]  This visit occurred during the SARS-CoV-2 public health emergency.  Safety protocols were in place, including screening questions prior to the visit, additional usage of staff PPE, and extensive cleaning of exam room while observing appropriate contact time as indicated for disinfecting solutions.     HPI The patient is here for follow up of their chronic medical problems, including htn, DM, CKD, hypothyroid, gerd, anxiety, depression, RLS, insomnia  She is having lumbar fusion  and needs clearance.     Medications and allergies reviewed with patient and updated if appropriate.  Patient Active Problem List   Diagnosis Date Noted   Unilateral primary osteoarthritis, left knee 01/09/2022   Onychomycosis 10/04/2021   Diarrhea 10/04/2021   Itching 07/16/2021   AKI (acute kidney injury) ([place]) 02/04/2021   Overdose 02/04/2021   Acute gout 06/20/2020   Interstitial cystitis 01/21/2020   Status post shoulder surgery 09/09/2019   Nausea 08/19/2019   Anxiety 05/04/2019   Insomnia 05/04/2019   Syncope 12/08/2018   Disorder of rotator cuff syndrome of left shoulder and allied disorder 06/22/2018   Bilateral leg edema 06/04/2018   Type 2 diabetes mellitus without complication, without long-term current use of insulin ([place]) 12/02/2017   Sacral pain 09/17/2017   COPD (chronic obstructive pulmonary disease) ([place]) 04/21/2017   Lower back pain 03/03/2017   CKD (chronic kidney disease) stage 3, GFR 30-59 ml/min (HCC) 08/07/2016   GERD (gastroesophageal reflux disease) 08/07/2016   Chronic respiratory failure ([place]) 07/29/2016   Arthritis of carpometacarpal ([place]) joint of left thumb 07/09/2016   Pneumothorax on left 01/31/2016   Chronic, continuous use of opioids 09/06/2015   Degenerative disc disease, lumbar 08/01/2015   Spondylolisthesis of lumbar region 08/01/2015   Hypoxia 09/14/2014    Urinary, incontinence, stress female 05/06/2014   Constipation 05/05/2014   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/14/2013   HTN (hypertension) 05/19/2013   HLD (hyperlipidemia) 05/19/2013   Depression 05/19/2013   Hypothyroidism 05/19/2013   Tobacco abuse    Osteoarthritis of both knees 11/18/2011   RLS (restless legs syndrome) 11/18/2011    Current Outpatient Medications on File Prior to Visit  Medication Sig Dispense Refill   acetaminophen (TYLENOL) 500 MG tablet Take 1,500 mg by mouth daily as needed for moderate pain or headache.     docusate sodium (COLACE) 100 MG capsule Take 200 mg by mouth at bedtime.     HYDROcodone-acetaminophen (NORCO) 10-325 MG tablet Take 1 tablet by mouth every 6 (six) hours as needed for severe pain (Chronic back pain). (Patient taking differently: Take 1-4 tablets by mouth 3 (three) times daily.) 84 tablet 0   NARCAN 4 MG/0.1ML LIQD nasal spray kit Place 1 spray into the nose as needed (accidental overdose). 1 each 0   No current facility-administered medications on file prior to visit.    Past Medical History:  Diagnosis Date   Active smoker    Anxiety    Calcifying tendinitis of shoulder    Chronic back pain    Chronic pain syndrome    COPD (chronic obstructive pulmonary disease) (HCC)    Dysthymic disorder    Emphysema lung (HCC)    GERD (gastroesophageal reflux disease)    Headache    "weekly maybe" (01/31/2016)   Heart murmur    for years, nothing to be concerned about   Herpes genitalia    History of blood transfusion  1980   related to "back surgery"   Hyperlipidemia    Hypertension    Hypothyroidism    Lumbago    Osteoarthrosis, unspecified whether generalized or localized, lower leg    Pain in joint, upper arm    Pneumothorax, left 01/31/2016   S/P Left posterior subcostal pain injection on 01/30/2016   PONV (postoperative nausea and vomiting)    gets nauseous  with longer surgery. Difficuty voiding after surgery   Postlaminectomy syndrome, thoracic  region    Primary localized osteoarthrosis, lower leg    Restless leg syndrome    Sleep apnea    s/p surgery- last sleep study 2011- doesnt use oxygen or machine at night as instructed,.   12/2014- Dr Halford Chessman  reports it is negative.   Thyroid disease     Past Surgical History:  Procedure Laterality Date   APPENDECTOMY     BACK SURGERY     18 back surgeries (2 thoracic & 16 lumbar) (01/31/2016)   DILATION AND CURETTAGE OF UTERUS     HAMMER TOE SURGERY     IR RADIOLOGIST EVAL & MGMT  07/21/2017   JOINT REPLACEMENT     KNEE ARTHROSCOPY Right    LAPAROSCOPIC CHOLECYSTECTOMY     LUMBAR FUSION N/A 08/01/2015   Procedure: Right sided L1-2 and L2-3 transforaminal lumbar interbody fusion with cages, Extension of posterior fusion T12 to L3, Replaced pedicle screws bilaterally L1-L2 , Replaced left sided pedicle screws L-3. Instrumentation T12 to L3 using local bone graft, Vivigen allograft and cancellous chips;  Surgeon: [name], MD;  Location: [place];  Service: Orthopedics;  Laterality: N/A;   LUMBAR LAMINECTOMY/DECOMPRESSION MICRODISCECTOMY N/A 01/28/2014   Procedure: Minimally Invasive Right  L1-2 Microdiscectomy;  Surgeon: [name], MD;  Location: [place];  Service: Orthopedics;  Laterality: N/A;   REVERSE SHOULDER ARTHROPLASTY Left 09/09/2019   Procedure: LEFT REVERSE SHOULDER REPLACEMENT;  Surgeon: Meredith Pel, MD;  Location: [facility];  Service: Orthopedics;  Laterality: Left;   TOTAL HIP ARTHROPLASTY Right    TOTAL KNEE ARTHROPLASTY  05/29/2012   Procedure: TOTAL KNEE ARTHROPLASTY;  Surgeon: Mcarthur Rossetti, MD;  Location: [facility];  Service: Orthopedics;  Laterality: Right;  Right Total Knee Arthroplasty   TUBAL LIGATION     UVULOPALATOPHARYNGOPLASTY     VAGINAL HYSTERECTOMY      Social History   Socioeconomic History   Marital status: Divorced    Spouse name: n/a   Number of children: 2   Years of education: 12+   Highest education level: Not on file  Occupational  History   Occupation: disability    Comment: back surgeries  Tobacco Use   Smoking status: Every Day    Packs/day: 1.25    Years: 45.00    Total pack years: 56.25    Types: Cigarettes   Smokeless tobacco: Never   Tobacco comments:    Uses Nicoderm patch discussed 1-800-quit-now  Vaping Use   Vaping Use: Never used  Substance and Sexual Activity   Alcohol use: No    Alcohol/week: 0.0 standard drinks of alcohol   Drug use: No    Types: Marijuana    Comment: 01/31/2016 "none since ~ 1980"   Sexual activity: Never    Partners: Male  Other Topics Concern   Not on file  Social History Narrative   Lives alone.  One daughter is local, but is getting ready to move to [place], where her children live with their father.  The other daughter lives  near [place], [place].   Social Determinants of Health   Financial Resource Strain: Low Risk  (07/15/2022)   Overall Financial Resource Strain (CARDIA)    Difficulty of Paying Living Expenses: Not very hard  Food Insecurity: No Food Insecurity (07/15/2022)   Hunger Vital Sign    Worried About Running Out of Food in the Last Year: Never true    Ran Out of Food in the Last Year: Never true  Transportation Needs: No Transportation Needs (07/15/2022)   PRAPARE - Hydrologist (Medical): No    Lack of Transportation (Non-Medical): No  Physical Activity: Inactive (07/15/2022)   Exercise Vital Sign    Days of Exercise per Week: 0 days    Minutes of Exercise per Session: 0 min  Stress: No Stress Concern Present (07/15/2022)   [place]    Feeling of Stress : Only a little  Social Connections: Socially Isolated (07/15/2022)   Social Connection and Isolation Panel [NHANES]    Frequency of Communication with Friends and Family: More than three times a week    Frequency of Social Gatherings with Friends and Family: More than three times a week    Attends  Religious Services: Never    Marine scientist or Organizations: No    Attends Music therapist: Never    Marital Status: Divorced    Family History  Problem Relation Age of Onset   Kidney disease Mother    Heart disease Father    Anuerysm Brother 29       brain   Heart disease Brother    Heart disease Sister 39       s/p CABG   Hypertension Sister    Colon cancer Neg Hx     Review of Systems     Objective:  There were no vitals filed for this visit. BP Readings from Last 3 Encounters:  05/08/22 (!) 142/80  04/04/22 109/68  03/06/22 134/78   Wt Readings from Last 3 Encounters:  05/08/22 151 lb (68.5 kg)  03/06/22 151 lb (68.5 kg)  01/11/22 150 lb 9.6 oz (68.3 kg)   There is no height or weight on file to calculate BMI.   Physical Exam    Constitutional: Appears well-developed and well-nourished. No distress.  HENT:  Head: Normocephalic and atraumatic.  Neck: Neck supple. No tracheal deviation present. No thyromegaly present.  No cervical lymphadenopathy Cardiovascular: Normal rate, regular rhythm and normal heart sounds.   No murmur heard. No carotid bruit .  No edema Pulmonary/Chest: Effort normal and breath sounds normal. No respiratory distress. No has no wheezes. No rales.  Skin: Skin is warm and dry. Not diaphoretic.  Psychiatric: Normal mood and affect. Behavior is normal.      Assessment & Plan:    See Problem List for Assessment and Plan of chronic medical problems.    This encounter was created in error - please disregard.

## 2021-07-16 ENCOUNTER — Ambulatory Visit (INDEPENDENT_AMBULATORY_CARE_PROVIDER_SITE_OTHER): Payer: Medicare Other | Admitting: Internal Medicine

## 2021-07-16 ENCOUNTER — Other Ambulatory Visit: Payer: Self-pay

## 2021-07-16 ENCOUNTER — Encounter: Payer: Self-pay | Admitting: Internal Medicine

## 2021-07-16 VITALS — BP 120/70 | HR 76 | Temp 97.8°F | Ht 63.0 in | Wt 153.0 lb

## 2021-07-16 DIAGNOSIS — I10 Essential (primary) hypertension: Secondary | ICD-10-CM

## 2021-07-16 DIAGNOSIS — Z720 Tobacco use: Secondary | ICD-10-CM | POA: Diagnosis not present

## 2021-07-16 DIAGNOSIS — E119 Type 2 diabetes mellitus without complications: Secondary | ICD-10-CM

## 2021-07-16 DIAGNOSIS — J439 Emphysema, unspecified: Secondary | ICD-10-CM

## 2021-07-16 DIAGNOSIS — L299 Pruritus, unspecified: Secondary | ICD-10-CM | POA: Diagnosis not present

## 2021-07-16 LAB — HEPATIC FUNCTION PANEL
ALT: 10 U/L (ref 0–35)
AST: 17 U/L (ref 0–37)
Albumin: 4.6 g/dL (ref 3.5–5.2)
Alkaline Phosphatase: 133 U/L — ABNORMAL HIGH (ref 39–117)
Bilirubin, Direct: 0.1 mg/dL (ref 0.0–0.3)
Total Bilirubin: 0.4 mg/dL (ref 0.2–1.2)
Total Protein: 7.5 g/dL (ref 6.0–8.3)

## 2021-07-16 LAB — BASIC METABOLIC PANEL
BUN: 16 mg/dL (ref 6–23)
CO2: 28 mEq/L (ref 19–32)
Calcium: 9.6 mg/dL (ref 8.4–10.5)
Chloride: 104 mEq/L (ref 96–112)
Creatinine, Ser: 1.31 mg/dL — ABNORMAL HIGH (ref 0.40–1.20)
GFR: 42.48 mL/min — ABNORMAL LOW (ref >60.00)
Glucose, Bld: 81 mg/dL (ref 70–99)
Potassium: 4 mEq/L (ref 3.5–5.1)
Sodium: 141 mEq/L (ref 135–145)

## 2021-07-16 LAB — LIPID PANEL
Cholesterol: 183 mg/dL (ref 0–200)
HDL: 37.7 mg/dL — ABNORMAL LOW (ref >39.00)
NonHDL: 145.5
Total CHOL/HDL Ratio: 5
Triglycerides: 254 mg/dL — ABNORMAL HIGH (ref 0.0–149.0)
VLDL: 50.8 mg/dL — ABNORMAL HIGH (ref 0.0–40.0)

## 2021-07-16 LAB — LDL CHOLESTEROL, DIRECT: Direct LDL: 110 mg/dL

## 2021-07-16 LAB — HEMOGLOBIN A1C: Hgb A1c MFr Bld: 6.3 % (ref 4.6–6.5)

## 2021-07-16 NOTE — Patient Instructions (Signed)
Please continue all other medications as before, and refills have been done if requested.  Please have the pharmacy call with any other refills you may need.  Please continue your efforts at being more active, low cholesterol diet, and weight control.  You are otherwise up to date with prevention measures today.  Please keep your appointments with your specialists as you may have planned  Please go to the LAB at the blood drawing area for the tests to be done  You will be contacted by phone if any changes need to be made immediately.  Otherwise, you will receive a letter about your results with an explanation, but please check with MyChart first.  Please remember to sign up for MyChart if you have not done so, as this will be important to you in the future with finding out test results, communicating by private email, and scheduling acute appointments online when needed.  Please make an Appointment to return in 4 months, or sooner if needed, to Dr Quay Burow

## 2021-07-16 NOTE — Telephone Encounter (Signed)
error 

## 2021-07-16 NOTE — Progress Notes (Signed)
Patient ID: Jill Phillips, female   DOB: 06/10/1956, 65 y.o.   MRN: [id number]        Chief Complaint: follow up itching all over without rash       HPI:  Blandina C Poudrier is a 65 y.o. female here with c/o marked itching primarily under the breasts, also back, head and eyes, associated with fatigue, insomnia, poor appetite, Pt denies chest pain, increased sob or doe, wheezing, orthopnea, PND, increased LE swelling, palpitations, dizziness or syncope.   Pt denies polydipsia, polyuria, or new focal neuro s/s.   Pt denies fever, wt loss, night sweats, loss of appetite, or other constitutional symptoms         Wt Readings from Last 3 Encounters:  07/16/21 153 lb (69.4 kg)  07/05/21 153 lb (69.4 kg)  06/04/21 153 lb (69.4 kg)   BP Readings from Last 3 Encounters:  07/16/21 120/70  07/05/21 132/79  06/24/21 116/69         Past Medical History:  Diagnosis Date   Active smoker    Anxiety    Calcifying tendinitis of shoulder    Chronic back pain    Chronic pain syndrome    COPD (chronic obstructive pulmonary disease) (HCC)    Dysthymic disorder    Emphysema lung (HCC)    GERD (gastroesophageal reflux disease)    Headache    "weekly maybe" (01/31/2016)   Heart murmur    for years, nothing to be concerned about   Herpes genitalia    History of blood transfusion 1980   related to "back surgery"   Hyperlipidemia    Hypertension    Hypothyroidism    Lumbago    Osteoarthrosis, unspecified whether generalized or localized, lower leg    Pain in joint, upper arm    Pneumothorax, left 01/31/2016   S/P Left posterior subcostal pain injection on 01/30/2016   PONV (postoperative nausea and vomiting)    gets nauseous  with longer surgery. Difficuty voiding after surgery   Postlaminectomy syndrome, thoracic region    Primary localized osteoarthrosis, lower leg    Restless leg syndrome    Sleep apnea    s/p surgery- last sleep study 2011- doesnt use oxygen or machine at night as instructed,.    12/2014- Dr Sood  reports it is negative.   Thyroid disease    Past Surgical History:  Procedure Laterality Date   APPENDECTOMY     BACK SURGERY     18 back surgeries (2 thoracic & 16 lumbar) (01/31/2016)   DILATION AND CURETTAGE OF UTERUS     HAMMER TOE SURGERY     IR RADIOLOGIST EVAL & MGMT  07/21/2017   JOINT REPLACEMENT     KNEE ARTHROSCOPY Right    LAPAROSCOPIC CHOLECYSTECTOMY     LUMBAR FUSION N/A 08/01/2015   Procedure: Right sided L1-2 and L2-3 transforaminal lumbar interbody fusion with cages, Extension of posterior fusion T12 to L3, Replaced pedicle screws bilaterally L1-L2 , Replaced left sided pedicle screws L-3. Instrumentation T12 to L3 using local bone graft, Vivigen allograft and cancellous chips;  Surgeon:  E Nitka, MD;  Location[facility];  Service: Orthopedics;  Laterality: N/A;   LUMBAR LAMINECTOMY/DECOMPRESSION MICRODISCECTOMY N/A 01/28/2014   Procedure: Minimally Invasive Right  L1-2 Microdiscectomy;  Surgeon:  E Nitka, MD;  Location: [facility];  Service: Orthopedics;  Laterality: N/A;   REVERSE SHOULDER ARTHROPLASTY Left 09/09/2019   Procedure: LEFT REVERSE SHOULDER REPLACEMENT;  Surgeon: Dean, Gregory Scott, MD;  Location: [facility];    Service: Orthopedics;  Laterality: Left;   TOTAL HIP ARTHROPLASTY Right    TOTAL KNEE ARTHROPLASTY  05/29/2012   Procedure: TOTAL KNEE ARTHROPLASTY;  Surgeon: Christopher Y Blackman, MD;  [facility];  Service: Orthopedics;  Laterality: Right;  Right Total Knee Arthroplasty   TUBAL LIGATION     UVULOPALATOPHARYNGOPLASTY     VAGINAL HYSTERECTOMY      reports that she has been smoking cigarettes. She has a 56.25 pack-year smoking history. She has never used smokeless tobacco. She reports that she does not drink alcohol and does not use drugs. family history includes Anuerysm (age of onset: 29) in her brother; Heart disease in her brother and father; Heart disease (age of onset: 45) in her sister; Hypertension in her sister; Kidney disease  in her mother. Allergies  Allergen Reactions   Flagyl [Metronidazole] Diarrhea   Limonene Itching    Yeast infection in mouth   Sulfa Antibiotics Other (See Comments)    Yeast infection in mouth   Doxycycline     Mouth soreness - reaction vs thrush?   Amoxicillin Other (See Comments)    REACTION: Oral yeast infection Has patient had a PCN reaction causing immediate rash, facial/tongue/throat swelling, SOB or lightheadedness with hypotension: No Has patient had a PCN reaction causing severe rash involving mucus membranes or skin necrosis: No Has patient had a PCN reaction that required hospitalization No Has patient had a PCN reaction occurring within the last 10 years: No If all of the above answers are "NO", then may proceed with Cephalosporin use.    Chlorzoxazone Other (See Comments)    headache   Codeine Other (See Comments)    headache   Darvocet [Propoxyphene N-Acetaminophen] Itching   Dilaudid [Hydromorphone Hcl] Itching   Keflex [Cephalexin] Other (See Comments)    Pt does not recall reaction (maybe yeast infection)   Morphine And Related Itching   Nitrofurantoin Monohyd Macro Hives    Reaction to Macrobid   Percocet [Oxycodone-Acetaminophen] Itching    Patient can tolerate Acetaminophen solely   Trazodone And Nefazodone Palpitations   Current Outpatient Medications on File Prior to Visit  Medication Sig Dispense Refill   acetaminophen (TYLENOL) 500 MG tablet Take 1,000 mg by mouth every 6 (six) hours as needed (for headaches.).     albuterol (PROAIR HFA) 108 (90 Base) MCG/ACT inhaler INHALE 2 PUFFS INTO THE LUNGS EVERY 6 HOURS AS NEEDED FOR WHEEZING OR SHORTNESS OF BREATH. (Patient taking differently: Inhale 2 puffs into the lungs every 6 (six) hours as needed for wheezing or shortness of breath.) 8.5 g 0   albuterol (PROVENTIL) (2.5 MG/3ML) 0.083% nebulizer solution Take 3 mLs (2.5 mg total) by nebulization every 4 (four) hours as needed for wheezing or shortness of  breath. 75 mL 2   amLODipine-benazepril (LOTREL) 10-40 MG capsule TAKE 1 CAPSULE BY MOUTH DAILY. 90 capsule 1   atorvastatin (LIPITOR) 20 MG tablet TAKE 1 TABLET BY MOUTH DAILY. (Patient taking differently: Take 20 mg by mouth daily.) 90 tablet 1   clonazePAM (KLONOPIN) 1 MG tablet TAKE 1 TABLET BY MOUTH 2 TIMES DAILY AS NEEDED FOR ANXIETY. 60 tablet 0   docusate sodium (COLACE) 100 MG capsule Take 300 mg by mouth at bedtime.     fentaNYL (DURAGESIC) 12 MCG/HR 1 patch every 3 (three) days.     gabapentin (NEURONTIN) 400 MG capsule TAKE 1 CAPSULE BY MOUTH 3 TIMES DAILY. 270 capsule 0   HYDROcodone-acetaminophen (NORCO) 10-325 MG tablet Take 1 tablet by mouth every   6 (six) hours as needed for severe pain (Chronic back pain). (Patient taking differently: Take 1-4 tablets by mouth every 6 (six) hours as needed for severe pain (Chronic back pain). Patient takes medication BID) 84 tablet 0   levothyroxine (SYNTHROID) 88 MCG tablet TAKE 1 TABLET BY MOUTH DAILY. (Patient taking differently: Take 88 mcg by mouth daily before breakfast.) 90 tablet 1   NARCAN 4 MG/0.1ML LIQD nasal spray kit Place 1 spray into the nose as needed (accidental overdose). 1 each 0   nebivolol (BYSTOLIC) 10 MG tablet Take 1 tablet (10 mg total) by mouth daily. 90 tablet 3   omeprazole (PRILOSEC) 40 MG capsule Take 1 capsule (40 mg total) by mouth 2 (two) times daily. 180 capsule 1   QUEtiapine (SEROQUEL) 100 MG tablet TAKE 1 AND 1/2 TABLETS BY MOUTH AT BEDTIME. (Patient taking differently: Take 150 mg by mouth at bedtime.) 135 tablet 1   rOPINIRole (REQUIP) 1 MG tablet TAKE 1 TO 2 TABLETS BY MOUTH TWO TIMES DAILY AS NEEDED (Patient taking differently: Take 1-2 mg by mouth 2 (two) times daily as needed.) 120 tablet 3   solifenacin (VESICARE) 10 MG tablet Take 10 mg by mouth daily.     tiotropium (SPIRIVA HANDIHALER) 18 MCG inhalation capsule Place 1 capsule (18 mcg total) into inhaler and inhale daily. 30 capsule 12   tiZANidine  (ZANAFLEX) 4 MG tablet TAKE 1 TABLET BY MOUTH EVERY 8 HOURS AS NEEDED (Patient taking differently: Take 4 mg by mouth every 8 (eight) hours as needed for muscle spasms.) 90 tablet 2   valACYclovir (VALTREX) 500 MG tablet Take 1 tablet (500 mg total) by mouth daily. 90 tablet 1   gabapentin (NEURONTIN) 100 MG capsule Take 1 capsule (100 mg total) by mouth 2 (two) times daily. 60 capsule 2   No current facility-administered medications on file prior to visit.        ROS:  All others reviewed and negative.  Objective        PE:  BP 120/70 (BP Location: Left Arm, Patient Position: Sitting, Cuff Size: Large)   Pulse 76   Temp 97.8 F (36.6 C) (Oral)   Ht 5' 3" (1.6 m)   Wt 153 lb (69.4 kg)   SpO2 94%   BMI 27.10 kg/m                 Constitutional: Pt appears in NAD               HENT: Head: NCAT.                Right Ear: External ear normal.                 Left Ear: External ear normal.                Eyes: . Pupils are equal, round, and reactive to light. Conjunctivae and EOM are normal               Nose: without d/c or deformity               Neck: Neck supple. Gross normal ROM               Cardiovascular: Normal rate and regular rhythm.                 Pulmonary/Chest: Effort normal and breath sounds without rales or wheezing.  Abd:  Soft, NT, ND, + BS, no organomegaly               Neurological: Pt is alert. At baseline orientation, motor grossly intact               Skin: Skin is warm. No rashes, no other new lesions, LE edema - none               Psychiatric: Pt behavior is normal without agitation   Micro: none  Cardiac tracings I have personally interpreted today:  none  Pertinent Radiological findings (summarize): none   Lab Results  Component Value Date   WBC 8.5 06/23/2021   HGB 12.2 06/23/2021   HCT 36.0 06/23/2021   PLT 245 06/23/2021   GLUCOSE 81 07/16/2021   CHOL 183 07/16/2021   TRIG 254.0 (H) 07/16/2021   HDL 37.70 (L) 07/16/2021    LDLDIRECT 110.0 07/16/2021   LDLCALC 86 11/03/2018   ALT 10 07/16/2021   AST 17 07/16/2021   NA 141 07/16/2021   K 4.0 07/16/2021   CL 104 07/16/2021   CREATININE 1.31 (H) 07/16/2021   BUN 16 07/16/2021   CO2 28 07/16/2021   TSH 1.47 12/29/2020   INR 0.87 05/26/2012   HGBA1C 6.3 07/16/2021   Assessment/Plan:  CHLOEANN ALFRED is a 65 y.o. White or Caucasian [1] female with  has a past medical history of Active smoker, Anxiety, Calcifying tendinitis of shoulder, Chronic back pain, Chronic pain syndrome, COPD (chronic obstructive pulmonary disease) ([place]), Dysthymic disorder, Emphysema lung ([place]), GERD (gastroesophageal reflux disease), Headache, Heart murmur, Herpes genitalia, History of blood transfusion (1980), Hyperlipidemia, Hypertension, Hypothyroidism, Lumbago, Osteoarthrosis, unspecified whether generalized or localized, lower leg, Pain in joint, upper arm, Pneumothorax, left (01/31/2016), PONV (postoperative nausea and vomiting), Postlaminectomy syndrome, thoracic region, Primary localized osteoarthrosis, lower leg, Restless leg syndrome, Sleep apnea, and Thyroid disease.  Itching No rash or swelling, ok for anithistamine prn  Tobacco abuse Pt counseled to quit smoking, pt not ready  HTN (hypertension) BP Readings from Last 3 Encounters:  07/16/21 120/70  07/05/21 132/79  06/24/21 116/69   Stable, but cant r/o ACE as source of itching, pt to change lotrel to amlodipine 10 qd and toprol xl 50, and f/u PCP with BP    COPD (chronic obstructive pulmonary disease) (HCC) O/w stable, no wheezing, continue current med tx  Type 2 diabetes mellitus without complication, without long-term current use of insulin ([place]) Also for f/u lab including a1c, goal < 7  Followup: Return in about 4 months (around 11/13/2021) for to PCP.  Cathlean Cower, MD 07/22/2021 9:52 PM [facility]

## 2021-07-17 ENCOUNTER — Encounter: Payer: Medicare Other | Admitting: Internal Medicine

## 2021-07-17 ENCOUNTER — Other Ambulatory Visit: Payer: Self-pay | Admitting: Internal Medicine

## 2021-07-17 DIAGNOSIS — E119 Type 2 diabetes mellitus without complications: Secondary | ICD-10-CM

## 2021-07-17 DIAGNOSIS — Z23 Encounter for immunization: Secondary | ICD-10-CM | POA: Diagnosis not present

## 2021-07-17 DIAGNOSIS — Z79899 Other long term (current) drug therapy: Secondary | ICD-10-CM | POA: Diagnosis not present

## 2021-07-17 DIAGNOSIS — F419 Anxiety disorder, unspecified: Secondary | ICD-10-CM

## 2021-07-17 DIAGNOSIS — F17210 Nicotine dependence, cigarettes, uncomplicated: Secondary | ICD-10-CM | POA: Diagnosis not present

## 2021-07-17 DIAGNOSIS — N1832 Chronic kidney disease, stage 3b: Secondary | ICD-10-CM

## 2021-07-17 DIAGNOSIS — G2581 Restless legs syndrome: Secondary | ICD-10-CM

## 2021-07-17 DIAGNOSIS — G4709 Other insomnia: Secondary | ICD-10-CM

## 2021-07-17 DIAGNOSIS — I10 Essential (primary) hypertension: Secondary | ICD-10-CM

## 2021-07-17 DIAGNOSIS — K219 Gastro-esophageal reflux disease without esophagitis: Secondary | ICD-10-CM

## 2021-07-17 DIAGNOSIS — F3289 Other specified depressive episodes: Secondary | ICD-10-CM

## 2021-07-17 DIAGNOSIS — E7849 Other hyperlipidemia: Secondary | ICD-10-CM

## 2021-07-17 DIAGNOSIS — E038 Other specified hypothyroidism: Secondary | ICD-10-CM

## 2021-07-17 DIAGNOSIS — M545 Low back pain, unspecified: Secondary | ICD-10-CM | POA: Diagnosis not present

## 2021-07-20 DIAGNOSIS — Z79899 Other long term (current) drug therapy: Secondary | ICD-10-CM | POA: Diagnosis not present

## 2021-07-22 ENCOUNTER — Encounter: Payer: Self-pay | Admitting: Internal Medicine

## 2021-07-22 MED ORDER — VENLAFAXINE HCL ER 150 MG PO CP24: 300.0000 mg | ORAL_CAPSULE | Freq: Every day | ORAL | 0 refills | Status: DC

## 2021-07-22 NOTE — Assessment & Plan Note (Signed)
Pt counseled to quit smoking, pt not ready 

## 2021-07-22 NOTE — Assessment & Plan Note (Signed)
O/w stable, no wheezing, continue current med tx

## 2021-07-22 NOTE — Assessment & Plan Note (Signed)
No rash or swelling, ok for anithistamine prn

## 2021-07-22 NOTE — Assessment & Plan Note (Signed)
Also for f/u lab including a1c, goal < 7

## 2021-07-22 NOTE — Assessment & Plan Note (Signed)
BP Readings from Last 3 Encounters:  07/16/21 120/70  07/05/21 132/79  06/24/21 116/69   Stable, but cant r/o ACE as source of itching, pt to change lotrel to amlodipine 10 qd and toprol xl 50, and f/u PCP with BP

## 2021-07-24 ENCOUNTER — Other Ambulatory Visit: Payer: Medicare Other

## 2021-07-24 ENCOUNTER — Other Ambulatory Visit: Payer: Self-pay | Admitting: Internal Medicine

## 2021-07-25 ENCOUNTER — Other Ambulatory Visit: Payer: Self-pay | Admitting: Internal Medicine

## 2021-07-26 DIAGNOSIS — J449 Chronic obstructive pulmonary disease, unspecified: Secondary | ICD-10-CM | POA: Diagnosis not present

## 2021-07-26 DIAGNOSIS — R0602 Shortness of breath: Secondary | ICD-10-CM | POA: Diagnosis not present

## 2021-07-30 ENCOUNTER — Other Ambulatory Visit: Payer: Self-pay | Admitting: Internal Medicine

## 2021-08-01 ENCOUNTER — Other Ambulatory Visit: Payer: Self-pay | Admitting: Internal Medicine

## 2021-08-03 ENCOUNTER — Ambulatory Visit: Payer: Medicare Other | Admitting: Specialist

## 2021-08-03 ENCOUNTER — Telehealth: Payer: Self-pay

## 2021-08-03 NOTE — Progress Notes (Signed)
Chronic Care Management Pharmacy Assistant   Name: Jill Phillips  MRN: [id number] DOB: 04-20-1956    Reason for Encounter: Disease State    Recent office visits:  07/17/21 [name], MD-PCP (Primary hypertension) no orders or med changes  Recent consult visits:  07/16/21 Biagio Borg, MD-Internal Medicine (Itching) Labs ordered  07/05/21 [name], MD-Orthopedic Surgery (Spinal stenosis of lumbosacral region) orders: MR Lumbar Spine, no med changes 06/04/21 [name], MD-Orthopedic Surgery (Low back pain, unspecified back pain laterality, unspecified chronicity, unspecified whether sciatica present) no orders or med changes  Hospital visits:  Medication Reconciliation was completed by comparing discharge summary, patient's EMR and Pharmacy list, and upon discussion with patient.  Admitted to the hospital on 06/23/21 due to COPD. Discharge date was 06/24/21. Discharged from [facility].    Medications that remain the same after Hospital Discharge:??  -All other medications will remain the same.    Admitted to the hospital on 04/24/21 due to [place] pain. Discharge date was 04/24/21. Discharged from [place] [facility].    Medications: Outpatient Encounter Medications as of 08/03/2021  Medication Sig   acetaminophen (TYLENOL) 500 MG tablet Take 1,000 mg by mouth every 6 (six) hours as needed (for headaches.).   albuterol (PROAIR HFA) 108 (90 Base) MCG/ACT inhaler INHALE 2 PUFFS INTO THE LUNGS EVERY 6 HOURS AS NEEDED FOR WHEEZING OR SHORTNESS OF BREATH. (Patient taking differently: Inhale 2 puffs into the lungs every 6 (six) hours as needed for wheezing or shortness of breath.)   albuterol (PROVENTIL) (2.5 MG/3ML) 0.083% nebulizer solution Take 3 mLs (2.5 mg total) by nebulization every 4 (four) hours as needed for wheezing or shortness of breath.   amLODipine-benazepril (LOTREL) 10-40 MG capsule TAKE 1 CAPSULE BY MOUTH DAILY.   atorvastatin  (LIPITOR) 20 MG tablet TAKE 1 TABLET BY MOUTH DAILY.   clonazePAM (KLONOPIN) 1 MG tablet TAKE 1 TABLET BY MOUTH 2 TIMES DAILY AS NEEDED FOR ANXIETY.   docusate sodium (COLACE) 100 MG capsule Take 300 mg by mouth at bedtime.   fentaNYL (DURAGESIC) 12 MCG/HR 1 patch every 3 (three) days.   gabapentin (NEURONTIN) 100 MG capsule Take 1 capsule (100 mg total) by mouth 2 (two) times daily.   gabapentin (NEURONTIN) 400 MG capsule TAKE 1 CAPSULE BY MOUTH 3 TIMES DAILY.   HYDROcodone-acetaminophen (NORCO) 10-325 MG tablet Take 1 tablet by mouth every 6 (six) hours as needed for severe pain (Chronic back pain). (Patient taking differently: Take 1-4 tablets by mouth every 6 (six) hours as needed for severe pain (Chronic back pain). Patient takes medication BID)   levothyroxine (SYNTHROID) 88 MCG tablet TAKE 1 TABLET BY MOUTH DAILY. (Patient taking differently: Take 88 mcg by mouth daily before breakfast.)   NARCAN 4 MG/0.1ML LIQD nasal spray kit Place 1 spray into the nose as needed (accidental overdose).   nebivolol (BYSTOLIC) 10 MG tablet Take 1 tablet (10 mg total) by mouth daily.   omeprazole (PRILOSEC) 40 MG capsule TAKE 1 CAPSULE BY MOUTH 2 TIMES DAILY BEFORE A MEAL.   QUEtiapine (SEROQUEL) 100 MG tablet Take 1.5 tablets (150 mg total) by mouth at bedtime.   rOPINIRole (REQUIP) 1 MG tablet TAKE 1 TO 2 TABLETS BY MOUTH TWO TIMES DAILY AS NEEDED (Patient taking differently: Take 1-2 mg by mouth 2 (two) times daily as needed.)   solifenacin (VESICARE) 10 MG tablet Take 10 mg by mouth daily.   tiotropium (SPIRIVA HANDIHALER) 18 MCG inhalation capsule Place  1 capsule (18 mcg total) into inhaler and inhale daily.   tiZANidine (ZANAFLEX) 4 MG tablet TAKE 1 TABLET BY MOUTH EVERY 8 HOURS AS NEEDED (Patient taking differently: Take 4 mg by mouth every 8 (eight) hours as needed for muscle spasms.)   valACYclovir (VALTREX) 500 MG tablet Take 1 tablet (500 mg total) by mouth daily.   venlafaxine XR (EFFEXOR-XR) 150  MG 24 hr capsule Take 2 capsules (300 mg total) by mouth daily with breakfast.   No facility-administered encounter medications on file as of 08/03/2021.   Have you had any problems recently with your health?Patient states that she is going to have back surgery next month, and that she woke up this morning wheezing but took her spiriva.Also still having a little swelling in her feet.  Have you had any problems with your pharmacy?Patient states she does not have any problems with getting medications or the cost of medications from the pharmacy  What issues or side effects are you having with your medications? She states that she does not have any side effects to medications  What would you like me to pass along to Rodena Goldmann for them to help you with? Patient states that she is doing ok except for the pain in her back. She did make an appt for Jan to speak with Linna Hoff to review medications  What can we do to take care of you better? Patient states nothing at this time  Care Gaps: Colonoscopy-11/22/16 Diabetic Foot Exam-01/21/20 Mammogram-NA Ophthalmology-12/09/17 Dexa Scan - NA Annual Well Visit - NA Micro albumin-NA Hemoglobin A1c- 07/16/21  Star Rating Drugs: Atorvastatin 20 mg-last fill 07/30/21 [place] Pharmacist Assistant [phone]

## 2021-08-10 ENCOUNTER — Other Ambulatory Visit: Payer: Self-pay | Admitting: Internal Medicine

## 2021-08-11 ENCOUNTER — Ambulatory Visit
Admission: RE | Admit: 2021-08-11 | Discharge: 2021-08-11 | Disposition: A | Discharge status: Home / Self Care | Payer: Medicare Other | Source: Ambulatory Visit | Attending: Specialist | Admitting: Specialist

## 2021-08-11 ENCOUNTER — Other Ambulatory Visit: Payer: Self-pay

## 2021-08-11 DIAGNOSIS — M4807 Spinal stenosis, lumbosacral region: Secondary | ICD-10-CM

## 2021-08-11 DIAGNOSIS — M2578 Osteophyte, vertebrae: Secondary | ICD-10-CM | POA: Diagnosis not present

## 2021-08-11 DIAGNOSIS — M48061 Spinal stenosis, lumbar region without neurogenic claudication: Secondary | ICD-10-CM | POA: Diagnosis not present

## 2021-08-11 DIAGNOSIS — M5136 Other intervertebral disc degeneration, lumbar region: Secondary | ICD-10-CM | POA: Diagnosis not present

## 2021-08-11 DIAGNOSIS — M419 Scoliosis, unspecified: Secondary | ICD-10-CM | POA: Diagnosis not present

## 2021-08-11 MED ORDER — GADOBENATE DIMEGLUMINE 529 MG/ML IV SOLN
14.0000 mL | Freq: Once | INTRAVENOUS | Status: AC | PRN
  Administered 2021-08-11: 14 mL via INTRAVENOUS

## 2021-08-15 ENCOUNTER — Other Ambulatory Visit: Payer: Self-pay

## 2021-08-15 ENCOUNTER — Encounter: Payer: Self-pay | Admitting: Specialist

## 2021-08-15 ENCOUNTER — Ambulatory Visit (INDEPENDENT_AMBULATORY_CARE_PROVIDER_SITE_OTHER): Payer: Medicare Other | Admitting: Specialist

## 2021-08-15 VITALS — BP 131/81 | HR 69 | Ht 63.0 in | Wt 153.0 lb

## 2021-08-15 DIAGNOSIS — M4325 Fusion of spine, thoracolumbar region: Secondary | ICD-10-CM | POA: Diagnosis not present

## 2021-08-15 DIAGNOSIS — M4155 Other secondary scoliosis, thoracolumbar region: Secondary | ICD-10-CM | POA: Diagnosis not present

## 2021-08-15 DIAGNOSIS — M5134 Other intervertebral disc degeneration, thoracic region: Secondary | ICD-10-CM | POA: Diagnosis not present

## 2021-08-15 DIAGNOSIS — M4724 Other spondylosis with radiculopathy, thoracic region: Secondary | ICD-10-CM | POA: Diagnosis not present

## 2021-08-15 DIAGNOSIS — F17210 Nicotine dependence, cigarettes, uncomplicated: Secondary | ICD-10-CM | POA: Diagnosis not present

## 2021-08-15 DIAGNOSIS — M545 Low back pain, unspecified: Secondary | ICD-10-CM | POA: Diagnosis not present

## 2021-08-15 DIAGNOSIS — R030 Elevated blood-pressure reading, without diagnosis of hypertension: Secondary | ICD-10-CM | POA: Diagnosis not present

## 2021-08-15 DIAGNOSIS — Z79899 Other long term (current) drug therapy: Secondary | ICD-10-CM | POA: Diagnosis not present

## 2021-08-15 NOTE — Patient Instructions (Signed)
Avoid bending, stooping and avoid lifting weights greater than 10 lbs. Avoid prolong standing and walking. Order for a new walker with wheels. Surgery scheduling secretary Kandice Hams, will call you in the next week to schedule for surgery.  Surgery recommended is a two thoracic fusion T9-10 and T8-9 this would be done with rods, screws and cages with local bone graft and allograft (donor bone graft).The left T9-10 foramen will be decompressed to relieve the ongoing left thoracic radicular pain.  Take meds per pain management.  Risk of surgery includes risk of infection 1 in 200 patients, bleeding 1/2% chance you would need a transfusion.   Risk to the nerves is one in 10,000. You will need to use a brace for 3 months and wean from the brace on the 4th month. Expect improved walking and standing tolerance. Expect relief of leg pain but numbness may persist depending on the length and degree of pressure that has been present.

## 2021-08-15 NOTE — Progress Notes (Addendum)
Office Visit Note   Patient: Jill Phillips           Date of Birth: 03-27-1956           MRN: [id number] Visit Date: 08/15/2021              Requested by: [name], MD [place],  [place] [address] PCP: [name], MD   Assessment & Plan: Visit Diagnoses:  1. Thoracic spondylosis with radiculopathy   2. Fusion of spine of thoracolumbar region   3. Other secondary scoliosis, thoracolumbar region     Plan: Avoid bending, stooping and avoid lifting weights greater than 10 lbs. Avoid prolong standing and walking. Order for a new walker with wheels. Surgery scheduling secretary Kandice Hams, will call you in the next week to schedule for surgery.  Surgery recommended is a two thoracic fusion T9-10 and T8-9 this would be done with rods, screws and cages with local bone graft and allograft (donor bone graft).The left T9-10 foramen will be decompressed to relieve the ongoing left thoracic radicular pain.  Take meds per pain management.  Risk of surgery includes risk of infection 1 in 200 patients, bleeding 1/2% chance you would need a transfusion.   Risk to the nerves is one in 10,000. You will need to use a brace for 3 months and wean from the brace on the 4th month. Expect improved walking and standing tolerance. Expect relief of leg pain but numbness may persist depending on the length and degree of pressure that has been present.  Follow-Up Instructions: Return in about 4 weeks (around 09/12/2021).   Orders:  No orders of the defined types were placed in this encounter.  No orders of the defined types were placed in this encounter.     Procedures: No procedures performed   Clinical Data: Findings:  Narrative & Impression CLINICAL DATA:  Spinal stenosis.  Prior back surgery.   EXAM: MRI LUMBAR SPINE WITHOUT AND WITH CONTRAST   TECHNIQUE: Multiplanar and multiecho pulse sequences of the lumbar spine were obtained without and with intravenous  contrast.   CONTRAST:  5mL MULTIHANCE GADOBENATE DIMEGLUMINE 529 MG/ML IV SOLN   COMPARISON:  MRI lumbar spine 03/01/2020. Lumbar radiographs 04/04/2021. CT lumbar spine 04/15/2019   FINDINGS: Segmentation:  5 lumbar segments.   Alignment:  Normal sagittal alignment.  Mild levoscoliosis   Vertebrae:  Negative for fracture or mass   Hardware: Pedicle screw and rod fusion from T11 through L4. Interbody spacers at L1-2, L2-3, L3-4.   Conus medullaris and cauda equina: Conus extends to the L1-2 level. Conus and cauda equina appear normal.   Paraspinal and other soft tissues: Negative for paraspinous mass or adenopathy. No paraspinous fluid collection.   Disc levels:   T9-10: Advanced disc degeneration with spurring. Moderate to severe left foraminal encroachment.   T10-11: Mild disc and facet degeneration.  Negative for stenosis   T11-12: Bilateral pedicle screw fusion.  Negative for stenosis.   T12-L1: Negative for stenosis   L1-2: Pedicle screw and interbody fusion and decompression posteriorly. Negative for stenosis   L2-3: Pedicle screw and interbody fusion. Diffuse endplate spurring. Moderate subarticular and foraminal stenosis bilaterally. Posterior decompression.   L3-4: Pedicle screw and interbody fusion.  Negative for stenosis   L4-5: Solid interbody fusion. Posterior laminectomy. Negative for stenosis   L5-S1: Solid interbody fusion.  Laminectomy.  Negative for stenosis.   IMPRESSION: 1. No acute abnormality. 2. Pedicle screw fusion T11 through L4.  3. Multilevel laminectomy. Spinal canal adequately decompressed. Moderate subarticular and foraminal stenosis bilaterally at L2-3. 4. Moderate to severe left foraminal encroachment at T9-10 due to spurring.     Electronically Signed   By: Franchot Gallo M.D.   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 Underwent recent MRI, sitting with pain into the left lateral chest wall, it is worse sitting at the computer and playing games for extended periods. She is having difficulty with house chores. Daughter is moved out and her children are grown, she is a 4 time grandmother, 63x79, 87-86 years old.  Review of Systems  Constitutional: Negative.   HENT: Negative.    Eyes: Negative.   Respiratory: Negative.    Cardiovascular: Negative.   Gastrointestinal: Negative.   Endocrine: Negative.   Genitourinary: Negative.   Musculoskeletal: Negative.   Skin: Negative.   Allergic/Immunologic: Negative.   Neurological: Negative.   Hematological: Negative.   Psychiatric/Behavioral: Negative.      Objective: Vital Signs: BP 131/81 (BP Location: Left Arm, Patient Position: Sitting)    Pulse 69    Ht 5\' 3"  (1.6 m)    Wt 153 lb (69.4 kg)    BMI 27.10 kg/m   Physical Exam Constitutional:      Appearance: She is well-developed.  HENT:     Head: Normocephalic and atraumatic.  Eyes:     Pupils: Pupils are equal, round, and reactive to light.  Pulmonary:     Effort: Pulmonary effort is normal.     Breath sounds: Normal breath sounds.  Abdominal:     General: Bowel sounds are normal.     Palpations: Abdomen is soft.  Musculoskeletal:        General: Normal range of motion.     Cervical back: Normal range of motion and neck supple.  Skin:    General: Skin is warm and dry.  Neurological:     Mental Status: She is alert and oriented to person, place, and time.  Psychiatric:        Behavior: Behavior normal.        Thought Content: Thought content normal.        Judgment: Judgment normal.   Ortho Exam  Specialty Comments:  No specialty comments available.  Imaging: No results found.   PMFS History: Patient Active Problem List   Diagnosis Date Noted   Degenerative disc disease, lumbar 08/01/2015     Priority: High    Class: Chronic   Spondylolisthesis of lumbar region 08/01/2015    Priority: High    Class: Chronic   Itching 07/16/2021   AKI (acute kidney injury) ([facility]) 02/04/2021   Overdose 02/04/2021   Acute gout 06/20/2020   Interstitial cystitis 01/21/2020   Status post shoulder surgery 09/09/2019   Nausea 08/19/2019   Anxiety 05/04/2019   Insomnia 05/04/2019   Syncope 12/08/2018   Disorder of rotator cuff syndrome of left shoulder and allied disorder 06/22/2018   Bilateral leg edema 06/04/2018   Type 2 diabetes mellitus without complication, without long-term current use of insulin ([place]) 12/02/2017   Sacral pain 09/17/2017   COPD (chronic obstructive pulmonary disease) ([facility]) 04/21/2017   Lower back pain 03/03/2017   CKD (chronic kidney disease) stage 3, GFR 30-59 ml/min (HCC) 08/07/2016   GERD (gastroesophageal  reflux disease) 08/07/2016   Chronic respiratory failure ([place]) 07/29/2016   Arthritis of carpometacarpal (CMC) joint of left thumb 07/09/2016   Pneumothorax on left 01/31/2016   Chronic, continuous use of opioids 09/06/2015   Hypoxia 09/14/2014   Urinary, incontinence, stress female 05/06/2014   Constipation 05/05/2014   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/14/2013   HTN (hypertension) 05/19/2013   HLD (hyperlipidemia) 05/19/2013   Depression 05/19/2013   Hypothyroidism 05/19/2013   Tobacco abuse    Osteoarthritis of both knees 11/18/2011   RLS (restless legs syndrome) 11/18/2011   Past Medical History:  Diagnosis Date   Active smoker    Anxiety    Calcifying tendinitis of shoulder    Chronic back pain    Chronic pain syndrome    COPD (chronic obstructive pulmonary disease) (HCC)    Dysthymic disorder    Emphysema lung (HCC)    GERD (gastroesophageal reflux disease)    Headache    "weekly maybe" (01/31/2016)   Heart murmur    for years, nothing to be concerned about   Herpes genitalia    History of blood transfusion 1980   related to "back surgery"   Hyperlipidemia     Hypertension    Hypothyroidism    Lumbago    Osteoarthrosis, unspecified whether generalized or localized, lower leg    Pain in joint, upper arm    Pneumothorax, left 01/31/2016   S/P Left posterior subcostal pain injection on 01/30/2016   PONV (postoperative nausea and vomiting)    gets nauseous  with longer surgery. Difficuty voiding after surgery   Postlaminectomy syndrome, thoracic region    Primary localized osteoarthrosis, lower leg    Restless leg syndrome    Sleep apnea    s/p surgery- last sleep study 2011- doesnt use oxygen or machine at night as instructed,.   12/2014- Dr Halford Chessman  reports it is negative.   Thyroid disease     Family History  Problem Relation Age of Onset   Kidney disease Mother    Heart disease Father    Anuerysm Brother 50       brain   Heart disease Brother    Heart disease Sister 65       s/p CABG   Hypertension Sister    Colon cancer Neg Hx     Past Surgical History:  Procedure Laterality Date   APPENDECTOMY     BACK SURGERY     18 back surgeries (2 thoracic & 16 lumbar) (01/31/2016)   DILATION AND CURETTAGE OF UTERUS     HAMMER TOE SURGERY     IR RADIOLOGIST EVAL & MGMT  07/21/2017   JOINT REPLACEMENT     KNEE ARTHROSCOPY Right    LAPAROSCOPIC CHOLECYSTECTOMY     LUMBAR FUSION N/A 08/01/2015   Procedure: Right sided L1-2 and L2-3 transforaminal lumbar interbody fusion with cages, Extension of posterior fusion T12 to L3, Replaced pedicle screws bilaterally L1-L2 , Replaced left sided pedicle screws L-3. Instrumentation T12 to L3 using local bone graft, Vivigen allograft and cancellous chips;  Surgeon: [name], MD;  Location: [facility];  Service: Orthopedics;  Laterality: N/A;   LUMBAR LAMINECTOMY/DECOMPRESSION MICRODISCECTOMY N/A 01/28/2014   Procedure: Minimally Invasive Right  L1-2 Microdiscectomy;  Surgeon: [name], MD;  Location: [facility];  Service: Orthopedics;  Laterality: N/A;   REVERSE SHOULDER ARTHROPLASTY Left 09/09/2019   Procedure:  LEFT REVERSE SHOULDER REPLACEMENT;  Surgeon: Meredith Pel, MD;  Location: [facility];  Service: Orthopedics;  Laterality: Left;  TOTAL HIP ARTHROPLASTY Right    TOTAL KNEE ARTHROPLASTY  05/29/2012   Procedure: TOTAL KNEE ARTHROPLASTY;  Surgeon: Mcarthur Rossetti, MD;  Location: [facility];  Service: Orthopedics;  Laterality: Right;  Right Total Knee Arthroplasty   TUBAL LIGATION     UVULOPALATOPHARYNGOPLASTY     VAGINAL HYSTERECTOMY     Social History   Occupational History   Occupation: disability    Comment: back surgeries  Tobacco Use   Smoking status: Every Day    Packs/day: 1.25    Years: 45.00    Pack years: 56.25    Types: Cigarettes   Smokeless tobacco: Never   Tobacco comments:    form given 12/25/16  Vaping Use   Vaping Use: Never used  Substance and Sexual Activity   Alcohol use: No    Alcohol/week: 0.0 standard drinks   Drug use: No    Types: Marijuana    Comment: 01/31/2016 "none since ~ 1980"   Sexual activity: Never    Partners: Male

## 2021-08-21 DIAGNOSIS — Z79899 Other long term (current) drug therapy: Secondary | ICD-10-CM | POA: Diagnosis not present

## 2021-08-22 ENCOUNTER — Other Ambulatory Visit: Payer: Self-pay | Admitting: Specialist

## 2021-08-22 ENCOUNTER — Other Ambulatory Visit: Payer: Self-pay | Admitting: Internal Medicine

## 2021-08-25 DIAGNOSIS — R0602 Shortness of breath: Secondary | ICD-10-CM | POA: Diagnosis not present

## 2021-08-25 DIAGNOSIS — J449 Chronic obstructive pulmonary disease, unspecified: Secondary | ICD-10-CM | POA: Diagnosis not present

## 2021-08-29 ENCOUNTER — Telehealth: Payer: Self-pay

## 2021-08-29 NOTE — Telephone Encounter (Signed)
Patient called wanting to schedule surgery.  Procedure dictated in most recent OV note on 08-15-21 is different than surgery sheet from 07-05-21.  Please advise.

## 2021-08-30 NOTE — Telephone Encounter (Signed)
New blue sheet completed

## 2021-09-05 NOTE — Telephone Encounter (Signed)
Spoke with patient and scheduled surgery. °

## 2021-09-07 ENCOUNTER — Other Ambulatory Visit: Payer: Self-pay | Admitting: Internal Medicine

## 2021-09-07 ENCOUNTER — Telehealth: Payer: Medicare Other

## 2021-09-07 NOTE — Progress Notes (Deleted)
Chronic Care Management Pharmacy Note  09/07/2021 Name:  Jill Phillips MRN:  [id number] DOB:  [birth year]  Summary: ***  Recommendations/Changes made from today's visit: ***  Plan: ***  Subjective: Jill Phillips is an 66 y.o. year old female who is a primary patient of Burns, Claudina Lick, MD.  The CCM team was consulted for assistance with disease management and care coordination needs.    Engaged with patient by telephone for follow up visit in response to provider referral for pharmacy case management and/or care coordination services.   Consent to Services:  The patient was given the following information about Chronic Care Management services today, agreed to services, and gave verbal consent: 1. CCM service includes personalized support from designated clinical staff supervised by the primary care provider, including individualized plan of care and coordination with other care providers 2. 24/7 contact phone numbers for assistance for urgent and routine care needs. 3. Service will only be billed when office clinical staff spend 20 minutes or more in a month to coordinate care. 4. Only one practitioner may furnish and bill the service in a calendar month. 5.The patient may stop CCM services at any time (effective at the end of the month) by phone call to the office staff. 6. The patient will be responsible for cost sharing (co-pay) of up to 20% of the service fee (after annual deductible is met). Patient agreed to services and consent obtained.  Patient Care Team: [name], MD as PCP - General (Internal Medicine) Charlton Haws, [facility] as Pharmacist (Pharmacist)  Recent office visits: 07/17/2021 - Dr. Quay Burow - no changes to medications  07/16/2021 - Dr. Jenny Reichmann - no changes to medications, follow up with PCP in 4 months  03/09/2021 - Dr. Quay Burow - Fentanyl decreased to 27mg / 72 hours - ropinirole increased to 1-23mBID, maxzide discontinued - f/u in 6 months   Recent consult  visits: 08/15/2021 - Dr. NiLouanne Skye Orthopedic Surgery - thoracic spondylsis with radiculopathy - no changes to medications - f/u in 4 weeks  07/05/2021 - Dr. NiFlavia Shipper Orthopedic Surgery - spinal stenosis - MRI ordered - follow up in 4 weeks  06/04/2021 - Dr. NiLouanne Skye Orthopedic Surgery - no changes to medications - f/u in 3 weeks  04/04/2021 - JaBenjiman CoreA-C  - Orthopedic Surgery - appointment made for USKoreaI joint injection - given left knee injection today, follow up in 6 weeks   Hospital visits: 06/23/2021 - 06/24/2021  - hospital admission - hypoxic respiratory failure - likely secondary to advanced COPD - ordered O2 and nebulizer machine on discharge  04/24/2021 - ED visit for flank pain - given morphine with benadryl in ED - symptoms improved - discharged to home as labs and imaging was reassuring   Objective:  Lab Results  Component Value Date   CREATININE 1.31 (H) 07/16/2021   BUN 16 07/16/2021   GFR 42.48 (L) 07/16/2021   GFRNONAA >60 06/24/2021   GFRAA 59 (L) 09/07/2019   NA 141 07/16/2021   K 4.0 07/16/2021   CALCIUM 9.6 07/16/2021   CO2 28 07/16/2021   GLUCOSE 81 07/16/2021    Lab Results  Component Value Date/Time   HGBA1C 6.3 07/16/2021 02:24 PM   HGBA1C 6.5 03/09/2021 12:18 PM   GFR 42.48 (L) 07/16/2021 02:24 PM   GFR 37.39 (L) 03/09/2021 12:18 PM    Last diabetic Eye exam:  Lab Results  Component Value Date/Time   HMDIABEYEEXA No Retinopathy 12/09/2017 12:31  PM    Last diabetic Foot exam:  No results found for: HMDIABFOOTEX   Lab Results  Component Value Date   CHOL 183 07/16/2021   HDL 37.70 (L) 07/16/2021   LDLCALC 86 11/03/2018   LDLDIRECT 110.0 07/16/2021   TRIG 254.0 (H) 07/16/2021   CHOLHDL 5 07/16/2021    Hepatic Function Latest Ref Rng & Units 07/16/2021 06/23/2021 02/04/2021  Total Protein 6.0 - 8.3 g/dL 7.5 6.4(L) 7.0  Albumin 3.5 - 5.2 g/dL 4.6 3.5 3.8  AST 0 - 37 U/L 17 14(L) 41  ALT 0 - 35 U/L '10 10 15  ' Alk Phosphatase 39 - 117 U/L 133(H) 119  129(H)  Total Bilirubin 0.2 - 1.2 mg/dL 0.4 0.5 1.0  Bilirubin, Direct 0.0 - 0.3 mg/dL 0.1 <0.1 -    Lab Results  Component Value Date/Time   TSH 1.47 12/29/2020 01:30 PM   TSH 3.65 06/20/2020 12:15 PM   FREET4 0.91 05/22/2015 03:52 PM   FREET4 0.88 01/18/2015 12:36 PM    CBC Latest Ref Rng & Units 06/23/2021 06/23/2021 04/23/2021  WBC 4.0 - 10.5 K/uL - 8.5 14.7(H)  Hemoglobin 12.0 - 15.0 g/dL 12.2 12.9 13.7  Hematocrit 36.0 - 46.0 % 36.0 42.3 42.4  Platelets 150 - 400 K/uL - 245 274    No results found for: VD25OH  Clinical ASCVD: {YES/NO:21197} The 10-year ASCVD risk score (Arnett DK, et al., 2019) is: 29.8%   Values used to calculate the score:     Age: 33 years     Sex: Female     Is Non-Hispanic African American: No     Diabetic: Yes     Tobacco smoker: Yes     Systolic Blood Pressure: 825 mmHg     Is BP treated: Yes     HDL Cholesterol: 37.7 mg/dL     Total Cholesterol: 183 mg/dL    Depression screen [facility] 2/9 08/24/2020 04/04/2020  Decreased Interest 0 0  Down, Depressed, Hopeless 0 0  PHQ - 2 Score 0 0  Altered sleeping - 2  Tired, decreased energy - 0  Change in appetite - 0  Feeling bad or failure about yourself  - 0  Trouble concentrating - 0  Moving slowly or fidgety/restless - 0  Suicidal thoughts - 0  PHQ-9 Score - 2  Difficult doing work/chores - Not difficult at all  Some recent data might be hidden     ***Other: (CHADS2VASc if Afib, MMRC or CAT for COPD, ACT, DEXA)  Social History   Tobacco Use  Smoking Status Every Day   Packs/day: 1.25   Years: 45.00   Pack years: 56.25   Types: Cigarettes  Smokeless Tobacco Never  Tobacco Comments   form given 12/25/16   BP Readings from Last 3 Encounters:  08/15/21 131/81  07/16/21 120/70  07/05/21 132/79   Pulse Readings from Last 3 Encounters:  08/15/21 69  07/16/21 76  07/05/21 68   Wt Readings from Last 3 Encounters:  08/15/21 153 lb (69.4 kg)  07/16/21 153 lb (69.4 kg)  07/05/21 153 lb  (69.4 kg)   BMI Readings from Last 3 Encounters:  08/15/21 27.10 kg/m  07/16/21 27.10 kg/m  07/05/21 27.10 kg/m    Assessment/Interventions: Review of patient past medical history, allergies, medications, health status, including review of consultants reports, laboratory and other test data, was performed as part of comprehensive evaluation and provision of chronic care management services.   SDOH:  (Social Determinants of Health) assessments and interventions performed: {yes/no:20286}  SDOH Screenings   Alcohol Screen: Not on file  Depression (PHQ2-9): Not on file  Financial Resource Strain: Low Risk    Difficulty of Paying Living Expenses: Not very hard  Food Insecurity: Not on file  Housing: Not on file  Physical Activity: Not on file  Social Connections: Not on file  Stress: Not on file  Tobacco Use: High Risk   Smoking Tobacco Use: Every Day   Smokeless Tobacco Use: Never   Passive Exposure: Not on file  Transportation Needs: Not on file    CCM Care Plan  Allergies  Allergen Reactions   Flagyl [Metronidazole] Diarrhea   Limonene Itching    Yeast infection in mouth   Sulfa Antibiotics Other (See Comments)    Yeast infection in mouth   Doxycycline     Mouth soreness - reaction vs thrush?   Amoxicillin Other (See Comments)    REACTION: Oral yeast infection Has patient had a PCN reaction causing immediate rash, facial/tongue/throat swelling, SOB or lightheadedness with hypotension: No Has patient had a PCN reaction causing severe rash involving mucus membranes or skin necrosis: No Has patient had a PCN reaction that required hospitalization No Has patient had a PCN reaction occurring within the last 10 years: No If all of the above answers are "NO", then may proceed with Cephalosporin use.    Chlorzoxazone Other (See Comments)    headache   Codeine Other (See Comments)    headache   Darvocet [Propoxyphene N-Acetaminophen] Itching   Dilaudid [Hydromorphone  Hcl] Itching   Keflex [Cephalexin] Other (See Comments)    Pt does not recall reaction (maybe yeast infection)   Morphine And Related Itching   Nitrofurantoin Monohyd Macro Hives    Reaction to [employer] [Oxycodone-Acetaminophen] Itching    Patient can tolerate Acetaminophen solely   Trazodone And Nefazodone Palpitations    Medications Reviewed Today     Reviewed by [name], MD (Physician) on 08/15/21 at [phone]  Med List Status: <None>   Medication Order Taking? Sig Documenting Provider Last Dose Status Informant  acetaminophen (TYLENOL) 500 MG tablet [id number] No Take 1,000 mg by mouth every 6 (six) hours as needed (for headaches.). [provider] Taking Active Self  albuterol (PROAIR HFA) 108 (90 Base) MCG/ACT inhaler [id number] No INHALE 2 PUFFS INTO THE LUNGS EVERY 6 HOURS AS NEEDED FOR WHEEZING OR SHORTNESS OF BREATH.  Patient taking differently: Inhale 2 puffs into the lungs every 6 (six) hours as needed for wheezing or shortness of breath.   [name], MD Taking Active   albuterol (PROVENTIL) (2.5 MG/3ML) 0.083% nebulizer solution [id number] No Take 3 mLs (2.5 mg total) by nebulization every 4 (four) hours as needed for wheezing or shortness of breath. Geradine Girt, DO Taking Active   amLODipine-benazepril (LOTREL) 10-40 MG capsule [id number] No TAKE 1 CAPSULE BY MOUTH DAILY. [name], MD Taking Active Self  atorvastatin (LIPITOR) 20 MG tablet [id number]  TAKE 1 TABLET BY MOUTH DAILY. [name], MD  Active   clonazePAM (KLONOPIN) 1 MG tablet [id number]  TAKE 1 TABLET BY MOUTH 2 TIMES DAILY AS NEEDED FOR ANXIETY. [name], MD  Active   docusate sodium (COLACE) 100 MG capsule [id number] No Take 300 mg by mouth at bedtime. [provider] Taking Active Self  fentaNYL (DURAGESIC) 12 MCG/HR [id number] No 1 patch every 3 (three) days. [provider] Taking Active Self  gabapentin (NEURONTIN) 100 MG capsule [id number] No Take 1  capsule (  100 mg total) by mouth 2 (two) times daily. Dessa Phi, DO 06/22/2021 Expired 06/23/21 2359 Self  gabapentin (NEURONTIN) 400 MG capsule [id number] No TAKE 1 CAPSULE BY MOUTH 3 TIMES DAILY. [name], MD Taking Active   HYDROcodone-acetaminophen [facility]) 10-325 MG tablet [id number] No Take 1 tablet by mouth every 6 (six) hours as needed for severe pain (Chronic back pain).  Patient taking differently: Take 1-4 tablets by mouth every 6 (six) hours as needed for severe pain (Chronic back pain). Patient takes medication BID   [name], MD Taking Active   levothyroxine (SYNTHROID) 88 MCG tablet [id number]  TAKE 1 TABLET BY MOUTH DAILY. [name], MD  Active   NARCAN 4 MG/0.1ML LIQD nasal spray kit [id number] No Place 1 spray into the nose as needed (accidental overdose). [name], MD Taking Active Self  nebivolol (BYSTOLIC) 10 MG tablet [id number] No Take 1 tablet (10 mg total) by mouth daily. [name], MD Taking Active Self  omeprazole (PRILOSEC) 40 MG capsule [id number]  TAKE 1 CAPSULE BY MOUTH 2 TIMES DAILY BEFORE A MEAL. [name], MD  Active   QUEtiapine (SEROQUEL) 100 MG tablet [id number]  Take 1.5 tablets (150 mg total) by mouth at bedtime. [name], MD  Active   rOPINIRole (REQUIP) 1 MG tablet [id number] No TAKE 1 TO 2 TABLETS BY MOUTH TWO TIMES DAILY AS NEEDED  Patient taking differently: Take 1-2 mg by mouth 2 (two) times daily as needed.   [name], MD Taking Active   solifenacin (VESICARE) 10 MG tablet [id number] No Take 10 mg by mouth daily. [provider] Taking Active Self  tiotropium (SPIRIVA HANDIHALER) 18 MCG inhalation capsule [id number] No Place 1 capsule (18 mcg total) into inhaler and inhale daily. [name], MD Taking Active Self  tiZANidine (ZANAFLEX) 4 MG tablet [id number] No TAKE 1 TABLET BY MOUTH EVERY 8 HOURS AS NEEDED  Patient taking differently: Take 4 mg by mouth every 8 (eight) hours as needed for muscle spasms.    [name], MD Taking Active   valACYclovir (VALTREX) 500 MG tablet [id number] No Take 1 tablet (500 mg total) by mouth daily. [name], MD Taking Active Self  venlafaxine XR (EFFEXOR-XR) 150 MG 24 hr capsule [id number]  Take 2 capsules (300 mg total) by mouth daily with breakfast. Biagio Borg, MD  Active   Med List Note Payton Doughty, CPhT 07/26/15 1253): Pain management with Dr. Alger Simons            Patient Active Problem List   Diagnosis Date Noted   Itching 07/16/2021   AKI (acute kidney injury) ([place]) 02/04/2021   Overdose 02/04/2021   Acute gout 06/20/2020   Interstitial cystitis 01/21/2020   Status post shoulder surgery 09/09/2019   Nausea 08/19/2019   Anxiety 05/04/2019   Insomnia 05/04/2019   Syncope 12/08/2018   Disorder of rotator cuff syndrome of left shoulder and allied disorder 06/22/2018   Bilateral leg edema 06/04/2018   Type 2 diabetes mellitus without complication, without long-term current use of insulin ([place]) 12/02/2017   Sacral pain 09/17/2017   COPD (chronic obstructive pulmonary disease) ([place]) 04/21/2017   Lower back pain 03/03/2017   CKD (chronic kidney disease) stage 3, GFR 30-59 ml/min (HCC) 08/07/2016   GERD (gastroesophageal reflux disease) 08/07/2016   Chronic respiratory failure ([place]) 07/29/2016   Arthritis of carpometacarpal (CMC) joint of left thumb 07/09/2016   Pneumothorax on left 01/31/2016  Chronic, continuous use of opioids 09/06/2015   Degenerative disc disease, lumbar 08/01/2015    Class: Chronic   Spondylolisthesis of lumbar region 08/01/2015    Class: Chronic   Hypoxia 09/14/2014   Urinary, incontinence, stress female 05/06/2014   Constipation 05/05/2014   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/14/2013   HTN (hypertension) 05/19/2013   HLD (hyperlipidemia) 05/19/2013   Depression 05/19/2013   Hypothyroidism 05/19/2013   Tobacco abuse    Osteoarthritis of both knees 11/18/2011   RLS (restless legs syndrome) 11/18/2011     Immunization History  Administered Date(s) Administered   Influenza, Seasonal, Injecte, Preservative Fre 08/05/2012   Influenza,inj,Quad PF,6+ Mos 05/05/2014, 05/22/2015, 07/29/2016, 06/04/2018   Influenza-Unspecified 07/16/2017, 07/30/2020   Moderna Sars-Covid-2 Vaccination 01/13/2020, 07/30/2020   Pneumococcal Conjugate-13 12/02/2017   Tdap 07/14/2013    Conditions to be addressed/monitored:  {USCCMDZASSESSMENTOPTIONS:23563}  There are no care plans that you recently modified to display for this patient.     Medication Assistance: {MEDASSISTANCEINFO:25044}  Compliance/Adherence/Medication fill history: Care Gaps: ***  Patient's preferred pharmacy is:  [place], [address] [place] [address] Phone: [phone] Fax: [phone]  [place], [address] [place] [address] Phone: [phone] Fax: [phone]  [facility] [address] [address] Phone: [phone] Fax: [phone]   Uses pill box? {Yes or If no, why not?:20788} Pt endorses ***% compliance  Care Plan and Follow Up Patient Decision:  {FOLLOWUP:24991}  Plan: {CM FOLLOW UP PLAN:25073}  ***

## 2021-09-12 DIAGNOSIS — E7800 Pure hypercholesterolemia, unspecified: Secondary | ICD-10-CM | POA: Diagnosis not present

## 2021-09-12 DIAGNOSIS — M545 Low back pain, unspecified: Secondary | ICD-10-CM | POA: Diagnosis not present

## 2021-09-12 DIAGNOSIS — E559 Vitamin D deficiency, unspecified: Secondary | ICD-10-CM | POA: Diagnosis not present

## 2021-09-12 DIAGNOSIS — Z79899 Other long term (current) drug therapy: Secondary | ICD-10-CM | POA: Diagnosis not present

## 2021-09-12 DIAGNOSIS — M129 Arthropathy, unspecified: Secondary | ICD-10-CM | POA: Diagnosis not present

## 2021-09-12 DIAGNOSIS — Z0000 Encounter for general adult medical examination without abnormal findings: Secondary | ICD-10-CM | POA: Diagnosis not present

## 2021-09-12 DIAGNOSIS — R5383 Other fatigue: Secondary | ICD-10-CM | POA: Diagnosis not present

## 2021-09-12 DIAGNOSIS — F17210 Nicotine dependence, cigarettes, uncomplicated: Secondary | ICD-10-CM | POA: Diagnosis not present

## 2021-09-12 DIAGNOSIS — R69 Illness, unspecified: Secondary | ICD-10-CM | POA: Diagnosis not present

## 2021-09-12 DIAGNOSIS — R030 Elevated blood-pressure reading, without diagnosis of hypertension: Secondary | ICD-10-CM | POA: Diagnosis not present

## 2021-09-19 ENCOUNTER — Other Ambulatory Visit: Payer: Self-pay | Admitting: Internal Medicine

## 2021-09-20 ENCOUNTER — Other Ambulatory Visit: Payer: Self-pay

## 2021-09-20 ENCOUNTER — Encounter: Payer: Self-pay | Admitting: Surgery

## 2021-09-20 ENCOUNTER — Ambulatory Visit (INDEPENDENT_AMBULATORY_CARE_PROVIDER_SITE_OTHER): Payer: Medicare Other | Admitting: Surgery

## 2021-09-20 VITALS — BP 133/73 | HR 76 | Ht 63.0 in | Wt 153.0 lb

## 2021-09-20 DIAGNOSIS — M4724 Other spondylosis with radiculopathy, thoracic region: Secondary | ICD-10-CM

## 2021-09-20 DIAGNOSIS — R69 Illness, unspecified: Secondary | ICD-10-CM | POA: Diagnosis not present

## 2021-09-20 NOTE — Progress Notes (Signed)
Surgical Instructions    Your procedure is scheduled on 09/25/21.  Report to [facility] Main Entrance "A" at 5:30 A.M., then check in with the Admitting office.  Call this number if you have problems the morning of surgery:  [phone]   If you have any questions prior to your surgery date call [phone]: Open Monday-Friday 8am-4pm    Remember:  Do not eat after midnight the night before your surgery  You may drink clear liquids until 4:30am the morning of your surgery.   Clear liquids allowed are: Water, Non-Citrus Juices (without pulp), Carbonated Beverages, Clear Tea, Black Coffee ONLY (NO MILK, CREAM OR POWDERED CREAMER of any kind), and Gatorade    Take these medicines the morning of surgery with A SIP OF WATER  atorvastatin (LIPITOR) cimetidine (TAGAMET) gabapentin (NEURONTIN) HYDROcodone-acetaminophen (NORCO) levothyroxine (SYNTHROID) nebivolol (BYSTOLIC) omeprazole (PRILOSEC) solifenacin (VESICARE) tiotropium (SPIRIVA HANDIHALER) tiZANidine (ZANAFLEX) valACYclovir (VALTREX)  venlafaxine XR (EFFEXOR-XR)   IF NEEDED: acetaminophen (TYLENOL)  albuterol (PROAIR HFA)  Carboxymethylcellul-Glycerin (LUBRICATING EYE DROPS OP) clonazePAM (KLONOPIN)   As of today, STOP taking any Aspirin (unless otherwise instructed by your surgeon) Aleve, Naproxen, Ibuprofen, Motrin, Advil, Goody's, BC's, all herbal medications, fish oil, and all vitamins.  After your COVID test   You are not required to quarantine however you are required to wear a well-fitting mask when you are out and around people not in your household.  If your mask becomes wet or soiled, replace with a new one.  Wash your hands often with soap and water for 20 seconds or clean your hands with an alcohol-based hand sanitizer that contains at least 60% alcohol.  Do not share personal items.  Notify your provider: if you are in close contact with someone who has COVID  or if you develop a fever of 100.4 or  greater, sneezing, cough, sore throat, shortness of breath or body aches.           Do not wear jewelry or makeup Do not wear lotions, powders, perfumes/colognes, or deodorant. Do not shave 48 hours prior to surgery.   Do not bring valuables to the hospital. DO Not wear nail polish, gel polish, artificial nails, or any other type of covering on natural nails (fingers and toes) If you have artificial nails or gel coating that need to be removed by a nail salon, please have this removed prior to surgery. Artificial nails or gel coating may interfere with anesthesia's ability to adequately monitor your vital signs.             [facility] is not responsible for any belongings or valuables.  Do NOT Smoke (Tobacco/Vaping)  24 hours prior to your procedure  If you use a CPAP at night, you may bring your mask for your overnight stay.   Contacts, glasses, hearing aids, dentures or partials may not be worn into surgery, please bring cases for these belongings   For patients admitted to the hospital, discharge time will be determined by your treatment team.   Patients discharged the day of surgery will not be allowed to drive home, and someone needs to stay with them for 24 hours.  NO VISITORS WILL BE ALLOWED IN PRE-OP WHERE PATIENTS ARE PREPPED FOR SURGERY.  ONLY 1 SUPPORT PERSON MAY BE PRESENT IN THE WAITING ROOM WHILE YOU ARE IN SURGERY.  IF YOU ARE TO BE ADMITTED, ONCE YOU ARE IN YOUR ROOM YOU WILL BE ALLOWED TWO (2) VISITORS. 1 (ONE) VISITOR MAY STAY OVERNIGHT BUT MUST ARRIVE  TO THE ROOM BY 8pm.  Minor children may have two parents present. Special consideration for safety and communication needs will be reviewed on a case by case basis.  Special instructions:    Oral Hygiene is also important to reduce your risk of infection.  Remember - BRUSH YOUR TEETH THE MORNING OF SURGERY WITH YOUR REGULAR TOOTHPASTE   [facility]- Preparing For Surgery  Before surgery, you can play an important role.  Because skin is not sterile, your skin needs to be as free of germs as possible. You can reduce the number of germs on your skin by washing with CHG (chlorahexidine gluconate) Soap before surgery.  CHG is an antiseptic cleaner which kills germs and bonds with the skin to continue killing germs even after washing.     Please do not use if you have an allergy to CHG or antibacterial soaps. If your skin becomes reddened/irritated stop using the CHG.  Do not shave (including legs and underarms) for at least 48 hours prior to first CHG shower. It is OK to shave your face.  Please follow these instructions carefully.     Shower the NIGHT BEFORE SURGERY and the MORNING OF SURGERY with CHG Soap.   If you chose to wash your hair, wash your hair first as usual with your normal shampoo. After you shampoo, rinse your hair and body thoroughly to remove the shampoo.  Then [employer] and genitals (private parts) with your normal soap and rinse thoroughly to remove soap.  After that Use CHG Soap as you would any other liquid soap. You can apply CHG directly to the skin and wash gently with a scrungie or a clean washcloth.   Apply the CHG Soap to your body ONLY FROM THE NECK DOWN.  Do not use on open wounds or open sores. Avoid contact with your eyes, ears, mouth and genitals (private parts). Wash Face and genitals (private parts)  with your normal soap.   Wash thoroughly, paying special attention to the area where your surgery will be performed.  Thoroughly rinse your body with warm water from the neck down.  DO NOT shower/wash with your normal soap after using and rinsing off the CHG Soap.  Pat yourself dry with a CLEAN TOWEL.  Wear CLEAN PAJAMAS to bed the night before surgery  Place CLEAN SHEETS on your bed the night before your surgery  DO NOT SLEEP WITH PETS.   Day of Surgery: Take a shower with CHG soap. Wear Clean/Comfortable clothing the morning of surgery Do not apply any  deodorants/lotions.   Remember to brush your teeth WITH YOUR REGULAR TOOTHPASTE.   Please read over the following fact sheets that you were given.

## 2021-09-21 ENCOUNTER — Inpatient Hospital Stay (HOSPITAL_COMMUNITY)
Admission: RE | Admit: 2021-09-21 | Discharge: 2021-09-21 | Disposition: A | Discharge status: Home / Self Care | Payer: Medicare Other | Source: Ambulatory Visit

## 2021-09-21 DIAGNOSIS — R69 Illness, unspecified: Secondary | ICD-10-CM | POA: Diagnosis not present

## 2021-09-25 ENCOUNTER — Encounter (HOSPITAL_COMMUNITY): Admission: RE | Payer: Self-pay | Source: Home / Self Care

## 2021-09-25 ENCOUNTER — Inpatient Hospital Stay (HOSPITAL_COMMUNITY): Admission: RE | Admit: 2021-09-25 | Payer: Medicare Other | Source: Home / Self Care | Admitting: Specialist

## 2021-09-25 SURGERY — POSTERIOR LUMBAR FUSION 2 WITH HARDWARE REMOVAL
Anesthesia: General

## 2021-09-27 ENCOUNTER — Other Ambulatory Visit: Payer: Self-pay | Admitting: Internal Medicine

## 2021-10-01 ENCOUNTER — Telehealth: Payer: Self-pay

## 2021-10-01 DIAGNOSIS — E119 Type 2 diabetes mellitus without complications: Secondary | ICD-10-CM

## 2021-10-01 DIAGNOSIS — E038 Other specified hypothyroidism: Secondary | ICD-10-CM

## 2021-10-01 DIAGNOSIS — R5383 Other fatigue: Secondary | ICD-10-CM

## 2021-10-01 DIAGNOSIS — R110 Nausea: Secondary | ICD-10-CM

## 2021-10-01 DIAGNOSIS — N1832 Chronic kidney disease, stage 3b: Secondary | ICD-10-CM

## 2021-10-01 NOTE — Telephone Encounter (Signed)
Blood work ordered.

## 2021-10-01 NOTE — Telephone Encounter (Signed)
Pt is requesting to have some labs drawn before her appt on 10/04/21.   Pt states that she is extremely fatigue, bad headache and Pt states that she feels sick on her stomach. Pt had diarrhea last night 09/30/21.  Pt also states that she doesn't know if this is related to anything but she noticed that her toes nails are discolored a pale yellow color.  Pt is concerned that her symptoms could be coming from her Kidney failure.

## 2021-10-02 MED ORDER — PROMETHAZINE HCL 25 MG PO TABS: 25.0000 mg | ORAL_TABLET | Freq: Three times a day (TID) | ORAL | 0 refills | Status: DC | PRN

## 2021-10-02 NOTE — Telephone Encounter (Signed)
Spoke with patient today. 

## 2021-10-02 NOTE — Telephone Encounter (Signed)
Patient stated that she will just wait until appointment on Thursday.

## 2021-10-02 NOTE — Addendum Note (Signed)
Addended by: [name] on: 10/02/2021 12:33 PM   Modules accepted: Orders

## 2021-10-02 NOTE — Telephone Encounter (Signed)
Yes  sent

## 2021-10-03 ENCOUNTER — Encounter: Payer: Self-pay | Admitting: Internal Medicine

## 2021-10-03 DIAGNOSIS — R0602 Shortness of breath: Secondary | ICD-10-CM | POA: Diagnosis not present

## 2021-10-03 DIAGNOSIS — J449 Chronic obstructive pulmonary disease, unspecified: Secondary | ICD-10-CM | POA: Diagnosis not present

## 2021-10-03 NOTE — Progress Notes (Signed)
Subjective:    Patient ID: Jill Phillips, female    DOB: [birth year], 66 y.o.   MRN: [id number]  This visit occurred during the SARS-CoV-2 public health emergency.  Safety protocols were in place, including screening questions prior to the visit, additional usage of staff PPE, and extensive cleaning of exam room while observing appropriate contact time as indicated for disinfecting solutions.    HPI The patient is here for an acute visit for fatigue, headache, nausea and diarrhea  She took her toenail polish 3 weeks ago and her toenails looked yellow with white spots.  The two big nails look loose.  She does her nails once a month and the last time she looked at them they looked normal.  No pain.   Frequent headache s- better with hctz.  BP high at times.  She has had a reading of 214/?,  150/?   Diarrhea was only once a day. She did take 3 stool softeners - she thinks she needs to back off to two.  Her restless leg syndrome is not well controlled.  The clonazepam is not working that much now and she wonders about decreasing the dose of its that she can increase some of her pain medication   Medications and allergies reviewed with patient and updated if appropriate.  Patient Active Problem List   Diagnosis Date Noted   Onychomycosis 10/04/2021   Diarrhea 10/04/2021   Itching 07/16/2021   AKI (acute kidney injury) ([facility]) 02/04/2021   Overdose 02/04/2021   Acute gout 06/20/2020   Interstitial cystitis 01/21/2020   Status post shoulder surgery 09/09/2019   Nausea 08/19/2019   Anxiety 05/04/2019   Insomnia 05/04/2019   Syncope 12/08/2018   Disorder of rotator cuff syndrome of left shoulder and allied disorder 06/22/2018   Bilateral leg edema 06/04/2018   Type 2 diabetes mellitus without complication, without long-term current use of insulin ([facility]) 12/02/2017   Sacral pain 09/17/2017   COPD (chronic obstructive pulmonary disease) ([facility]) 04/21/2017   Lower back pain 03/03/2017    CKD (chronic kidney disease) stage 3, GFR 30-59 ml/min (HCC) 08/07/2016   GERD (gastroesophageal reflux disease) 08/07/2016   Chronic respiratory failure ([facility]) 07/29/2016   Arthritis of carpometacarpal (CMC) joint of left thumb 07/09/2016   Pneumothorax on left 01/31/2016   Chronic, continuous use of opioids 09/06/2015   Degenerative disc disease, lumbar 08/01/2015   Spondylolisthesis of lumbar region 08/01/2015   Hypoxia 09/14/2014   Urinary, incontinence, stress female 05/06/2014   Constipation 05/05/2014   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/14/2013   HTN (hypertension) 05/19/2013   HLD (hyperlipidemia) 05/19/2013   Depression 05/19/2013   Hypothyroidism 05/19/2013   Tobacco abuse    Osteoarthritis of both knees 11/18/2011   RLS (restless legs syndrome) 11/18/2011    Current Outpatient Medications on File Prior to Visit  Medication Sig Dispense Refill   acetaminophen (TYLENOL) 500 MG tablet Take 1,500 mg by mouth daily as needed for moderate pain or headache.     albuterol (PROAIR HFA) 108 (90 Base) MCG/ACT inhaler INHALE 2 PUFFS INTO THE LUNGS EVERY 6 HOURS AS NEEDED FOR WHEEZING OR SHORTNESS OF BREATH. (Patient taking differently: Inhale 2 puffs into the lungs every 6 (six) hours as needed for wheezing or shortness of breath.) 8.5 g 0   amLODipine-benazepril (LOTREL) 10-40 MG capsule TAKE 1 CAPSULE BY MOUTH DAILY. 90 capsule 1   atorvastatin (LIPITOR) 20 MG tablet TAKE 1 TABLET BY MOUTH DAILY. 90 tablet 1   Carboxymethylcellul-Glycerin (LUBRICATING EYE  DROPS OP) Place 1 drop into both eyes daily as needed (dry eyes).     cimetidine (TAGAMET) 400 MG tablet Take 400 mg by mouth 2 (two) times daily.     docusate sodium (COLACE) 100 MG capsule Take 200 mg by mouth at bedtime.     fentaNYL (DURAGESIC) 12 MCG/HR Place 1 patch onto the skin every 3 (three) days.     gabapentin (NEURONTIN) 400 MG capsule TAKE 1 CAPSULE BY MOUTH 3 TIMES DAILY. 270 capsule 0   hydrochlorothiazide (MICROZIDE) 12.5 MG capsule  Take 12.5 mg by mouth daily as needed (headaches).     HYDROcodone-acetaminophen (NORCO) 10-325 MG tablet Take 1 tablet by mouth every 6 (six) hours as needed for severe pain (Chronic back pain). (Patient taking differently: Take 1-4 tablets by mouth 3 (three) times daily.) 84 tablet 0   levothyroxine (SYNTHROID) 88 MCG tablet TAKE 1 TABLET BY MOUTH DAILY. 90 tablet 1   Melatonin 10 MG TABS Take 30 mg by mouth at bedtime.     NARCAN 4 MG/0.1ML LIQD nasal spray kit Place 1 spray into the nose as needed (accidental overdose). 1 each 0   omeprazole (PRILOSEC) 40 MG capsule TAKE 1 CAPSULE BY MOUTH 2 TIMES DAILY BEFORE A MEAL. 180 capsule 3   promethazine (PHENERGAN) 25 MG tablet Take 1 tablet (25 mg total) by mouth every 8 (eight) hours as needed for nausea or vomiting. 10 tablet 0   QUEtiapine (SEROQUEL) 100 MG tablet Take 1.5 tablets (150 mg total) by mouth at bedtime. 135 tablet 1   rOPINIRole (REQUIP) 1 MG tablet TAKE 1 TO 2 TABLETS BY MOUTH TWO TIMES DAILY AS NEEDED (Patient taking differently: Take 1-2 mg by mouth See admin instructions. Take 1 mg in the afternoon and 2 mg at night) 120 tablet 3   solifenacin (VESICARE) 5 MG tablet Take 5 mg by mouth 2 (two) times daily.     tiotropium (SPIRIVA HANDIHALER) 18 MCG inhalation capsule Place 1 capsule (18 mcg total) into inhaler and inhale daily. 30 capsule 12   tiZANidine (ZANAFLEX) 4 MG tablet TAKE 1 TABLET BY MOUTH EVERY 8 HOURS AS NEEDED (Patient taking differently: Take 4 mg by mouth 3 (three) times daily.) 90 tablet 2   valACYclovir (VALTREX) 500 MG tablet Take 1 tablet (500 mg total) by mouth daily. 90 tablet 1   albuterol (PROVENTIL) (2.5 MG/3ML) 0.083% nebulizer solution Take 3 mLs (2.5 mg total) by nebulization every 4 (four) hours as needed for wheezing or shortness of breath. (Patient not taking: Reported on 09/18/2021) 75 mL 2   gabapentin (NEURONTIN) 100 MG capsule Take 1 capsule (100 mg total) by mouth 2 (two) times daily. (Patient not  taking: Reported on 09/18/2021) 60 capsule 2   No current facility-administered medications on file prior to visit.    Past Medical History:  Diagnosis Date   Active smoker    Anxiety    Calcifying tendinitis of shoulder    Chronic back pain    Chronic pain syndrome    COPD (chronic obstructive pulmonary disease) (HCC)    Dysthymic disorder    Emphysema lung (HCC)    GERD (gastroesophageal reflux disease)    Headache    "weekly maybe" (01/31/2016)   Heart murmur    for years, nothing to be concerned about   Herpes genitalia    History of blood transfusion 1980   related to "back surgery"   Hyperlipidemia    Hypertension    Hypothyroidism    Lumbago  Osteoarthrosis, unspecified whether generalized or localized, lower leg    Pain in joint, upper arm    Pneumothorax, left 01/31/2016   S/P Left posterior subcostal pain injection on 01/30/2016   PONV (postoperative nausea and vomiting)    gets nauseous  with longer surgery. Difficuty voiding after surgery   Postlaminectomy syndrome, thoracic region    Primary localized osteoarthrosis, lower leg    Restless leg syndrome    Sleep apnea    s/p surgery- last sleep study 2011- doesnt use oxygen or machine at night as instructed,.   12/2014- Dr Halford Chessman  reports it is negative.   Thyroid disease     Past Surgical History:  Procedure Laterality Date   APPENDECTOMY     BACK SURGERY     18 back surgeries (2 thoracic & 16 lumbar) (01/31/2016)   DILATION AND CURETTAGE OF UTERUS     HAMMER TOE SURGERY     IR RADIOLOGIST EVAL & MGMT  07/21/2017   JOINT REPLACEMENT     KNEE ARTHROSCOPY Right    LAPAROSCOPIC CHOLECYSTECTOMY     LUMBAR FUSION N/A 08/01/2015   Procedure: Right sided L1-2 and L2-3 transforaminal lumbar interbody fusion with cages, Extension of posterior fusion T12 to L3, Replaced pedicle screws bilaterally L1-L2 , Replaced left sided pedicle screws L-3. Instrumentation T12 to L3 using local bone graft, Vivigen allograft and  cancellous chips;  Surgeon: [name], MD;  Location: [facility];  Service: Orthopedics;  Laterality: N/A;   LUMBAR LAMINECTOMY/DECOMPRESSION MICRODISCECTOMY N/A 01/28/2014   Procedure: Minimally Invasive Right  L1-2 Microdiscectomy;  Surgeon: [name], MD;  Location: [facility];  Service: Orthopedics;  Laterality: N/A;   REVERSE SHOULDER ARTHROPLASTY Left 09/09/2019   Procedure: LEFT REVERSE SHOULDER REPLACEMENT;  Surgeon: Meredith Pel, MD;  Location: [facility];  Service: Orthopedics;  Laterality: Left;   TOTAL HIP ARTHROPLASTY Right    TOTAL KNEE ARTHROPLASTY  05/29/2012   Procedure: TOTAL KNEE ARTHROPLASTY;  Surgeon: Mcarthur Rossetti, MD;  Location: [facility];  Service: Orthopedics;  Laterality: Right;  Right Total Knee Arthroplasty   TUBAL LIGATION     UVULOPALATOPHARYNGOPLASTY     VAGINAL HYSTERECTOMY      Social History   Socioeconomic History   Marital status: Divorced    Spouse name: n/a   Number of children: 2   Years of education: 12+   Highest education level: Not on file  Occupational History   Occupation: disability    Comment: back surgeries  Tobacco Use   Smoking status: Every Day    Packs/day: 1.25    Years: 45.00    Pack years: 56.25    Types: Cigarettes   Smokeless tobacco: Never   Tobacco comments:    form given 12/25/16  Vaping Use   Vaping Use: Never used  Substance and Sexual Activity   Alcohol use: No    Alcohol/week: 0.0 standard drinks   Drug use: No    Types: Marijuana    Comment: 01/31/2016 "none since ~ 1980"   Sexual activity: Never    Partners: Male  Other Topics Concern   Not on file  Social History Narrative   Lives alone.  One daughter is local, but is getting ready to move to [place], where her children live with their father.  The other daughter lives near [place], [place].   Social Determinants of Health   Financial Resource Strain: Not on file  Food Insecurity: Not on file  Transportation Needs: Not on file  Physical  Activity: Not  on file  Stress: Not on file  Social Connections: Not on file    Family History  Problem Relation Age of Onset   Kidney disease Mother    Heart disease Father    Anuerysm Brother 93       brain   Heart disease Brother    Heart disease Sister 105       s/p CABG   Hypertension Sister    Colon cancer Neg Hx     Review of Systems  Constitutional:  Negative for fever.  Gastrointestinal:  Positive for diarrhea (Once a day). Negative for abdominal pain and nausea.  Musculoskeletal:  Positive for arthralgias and back pain.  Skin:  Positive for color change (Toenails).  Neurological:  Positive for headaches.      Objective:   Vitals:   10/04/21 0942  BP: (!) 142/84  Pulse: 68  Temp: 98.6 F (37 C)  SpO2: 97%   BP Readings from Last 3 Encounters:  10/04/21 (!) 142/84  09/20/21 133/73  08/15/21 131/81   Wt Readings from Last 3 Encounters:  10/04/21 151 lb 12.8 oz (68.9 kg)  09/20/21 153 lb (69.4 kg)  08/15/21 153 lb (69.4 kg)   Body mass index is 26.89 kg/m.   Physical Exam Constitutional:      General: She is not in acute distress.    Appearance: Normal appearance. She is not ill-appearing.  HENT:     Head: Normocephalic and atraumatic.  Musculoskeletal:     Right lower leg: No edema.     Left lower leg: No edema.  Skin:    General: Skin is warm and dry.     Findings: No erythema or rash.     Comments: Bilateral toenails with slight thickening and yellow discoloration  Neurological:     Mental Status: She is alert.           Assessment & Plan:    See Problem List for Assessment and Plan of chronic medical problems.

## 2021-10-04 ENCOUNTER — Other Ambulatory Visit: Payer: Self-pay

## 2021-10-04 ENCOUNTER — Ambulatory Visit (INDEPENDENT_AMBULATORY_CARE_PROVIDER_SITE_OTHER): Payer: Medicare Other | Admitting: Internal Medicine

## 2021-10-04 DIAGNOSIS — I10 Essential (primary) hypertension: Secondary | ICD-10-CM | POA: Diagnosis not present

## 2021-10-04 DIAGNOSIS — K591 Functional diarrhea: Secondary | ICD-10-CM

## 2021-10-04 DIAGNOSIS — B351 Tinea unguium: Secondary | ICD-10-CM | POA: Insufficient documentation

## 2021-10-04 DIAGNOSIS — F419 Anxiety disorder, unspecified: Secondary | ICD-10-CM | POA: Diagnosis not present

## 2021-10-04 DIAGNOSIS — R197 Diarrhea, unspecified: Secondary | ICD-10-CM | POA: Insufficient documentation

## 2021-10-04 DIAGNOSIS — G2581 Restless legs syndrome: Secondary | ICD-10-CM

## 2021-10-04 MED ORDER — CICLOPIROX 8 % EX SOLN: Freq: Every day | CUTANEOUS | 0 refills | Status: DC

## 2021-10-04 MED ORDER — NEBIVOLOL HCL 20 MG PO TABS: 20.0000 mg | ORAL_TABLET | Freq: Every day | ORAL | 1 refills | Status: DC

## 2021-10-04 MED ORDER — CLONAZEPAM 0.5 MG PO TABS: 0.5000 mg | ORAL_TABLET | Freq: Three times a day (TID) | ORAL | 1 refills | Status: DC

## 2021-10-04 MED ORDER — VENLAFAXINE HCL ER 150 MG PO CP24: 300.0000 mg | ORAL_CAPSULE | Freq: Every day | ORAL | 0 refills | Status: DC

## 2021-10-04 NOTE — Assessment & Plan Note (Signed)
Chronic Not controlled Currently on Requip 1 mg in the afternoon and 2 mg at night, clonazepam 1 mg twice daily, Seroquel 150 mg at bedtime and gabapentin 400 mg 3 times daily Will decrease the clonazepam to 0.5 mg 3 times daily and possibly twice daily since its not helping with her RLS anymore-this may allow for other medications to be increased, such as her pain medication-she will discuss this with pain management

## 2021-10-04 NOTE — Patient Instructions (Addendum)
° ° ° °  Medications changes include :   increase nebivolol to 20 mg daily, ciclopirox liquid to toenails   Your prescription(s) have been submitted to your pharmacy. Please take as directed and contact our office if you believe you are having problem(s) with the medication(s).

## 2021-10-04 NOTE — Assessment & Plan Note (Signed)
Acute She has been experiencing diarrhea, but only once a day and she has been taking 3 stool softeners Will decrease to 2 stool softeners a day

## 2021-10-04 NOTE — Assessment & Plan Note (Signed)
Acute Bilateral toenails have turned yellow/discolored possibly slightly thickened Start ciclopirox daily x12 weeks

## 2021-10-04 NOTE — Assessment & Plan Note (Signed)
Chronic Has had very high blood pressures at home and the headaches Overall blood pressure not ideally controlled Continue amlodipine-benazepril 10-40 mg daily Continue hydrochlorothiazide 12.5 mg daily as needed-do not want to increase this because of chronic kidney disease Increase Bystolic to 20 mg daily-we will need to monitor heart rate-if this is not well-tolerated we will consider hydralazine Follow-up next month as scheduled

## 2021-10-04 NOTE — Assessment & Plan Note (Signed)
Chronic Online clonazepam 1 mg twice daily and Effexor 300 mg daily Continue Effexor 300 mg daily Will decrease clonazepam to 0.5 mg 3 times daily and possibly decrease to twice daily depending on how she does

## 2021-10-11 ENCOUNTER — Telehealth: Payer: Self-pay | Admitting: Internal Medicine

## 2021-10-11 MED ORDER — ONDANSETRON HCL 4 MG PO TABS: 4.0000 mg | ORAL_TABLET | Freq: Three times a day (TID) | ORAL | 0 refills | Status: DC | PRN

## 2021-10-11 NOTE — Telephone Encounter (Signed)
Pt statespromethazine (PHENERGAN) 25 MG tablet  prescribed "is not strong enough"  Pt states after eating chocolate ice cream she "threw up all over the bed "  Pt requesting a c/b

## 2021-10-11 NOTE — Telephone Encounter (Signed)
We can try a different medication like Zofran, but there is no stronger dose of the Phenergan.  I would recommend not eating chocolate ice cream first because she is a diabetic and also because chocolate is irritating for the stomach.  She should be avoiding medications that increase reflux-she can look them up online or we can send her something through [facility].

## 2021-10-11 NOTE — Addendum Note (Signed)
Addended by: [name] on: 10/11/2021 02:55 PM   Modules accepted: Orders

## 2021-10-16 ENCOUNTER — Other Ambulatory Visit: Payer: Self-pay | Admitting: Internal Medicine

## 2021-10-17 ENCOUNTER — Encounter: Payer: Self-pay | Admitting: Specialist

## 2021-10-17 ENCOUNTER — Other Ambulatory Visit: Payer: Self-pay

## 2021-10-17 ENCOUNTER — Ambulatory Visit (INDEPENDENT_AMBULATORY_CARE_PROVIDER_SITE_OTHER): Payer: Medicare Other | Admitting: Specialist

## 2021-10-17 VITALS — BP 155/82 | HR 69 | Ht 63.0 in | Wt 151.8 lb

## 2021-10-17 DIAGNOSIS — M5134 Other intervertebral disc degeneration, thoracic region: Secondary | ICD-10-CM | POA: Diagnosis not present

## 2021-10-17 DIAGNOSIS — M1712 Unilateral primary osteoarthritis, left knee: Secondary | ICD-10-CM | POA: Diagnosis not present

## 2021-10-17 DIAGNOSIS — G8929 Other chronic pain: Secondary | ICD-10-CM

## 2021-10-17 DIAGNOSIS — J449 Chronic obstructive pulmonary disease, unspecified: Secondary | ICD-10-CM | POA: Diagnosis not present

## 2021-10-17 DIAGNOSIS — F17210 Nicotine dependence, cigarettes, uncomplicated: Secondary | ICD-10-CM | POA: Diagnosis not present

## 2021-10-17 DIAGNOSIS — M4155 Other secondary scoliosis, thoracolumbar region: Secondary | ICD-10-CM

## 2021-10-17 DIAGNOSIS — Z79899 Other long term (current) drug therapy: Secondary | ICD-10-CM | POA: Diagnosis not present

## 2021-10-17 DIAGNOSIS — J439 Emphysema, unspecified: Secondary | ICD-10-CM | POA: Diagnosis not present

## 2021-10-17 DIAGNOSIS — M4724 Other spondylosis with radiculopathy, thoracic region: Secondary | ICD-10-CM | POA: Diagnosis not present

## 2021-10-17 DIAGNOSIS — M4325 Fusion of spine, thoracolumbar region: Secondary | ICD-10-CM | POA: Diagnosis not present

## 2021-10-17 DIAGNOSIS — M545 Low back pain, unspecified: Secondary | ICD-10-CM

## 2021-10-17 DIAGNOSIS — M960 Pseudarthrosis after fusion or arthrodesis: Secondary | ICD-10-CM

## 2021-10-17 DIAGNOSIS — R030 Elevated blood-pressure reading, without diagnosis of hypertension: Secondary | ICD-10-CM | POA: Diagnosis not present

## 2021-10-17 DIAGNOSIS — M533 Sacrococcygeal disorders, not elsewhere classified: Secondary | ICD-10-CM | POA: Diagnosis not present
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
## 2021-10-17 NOTE — Patient Instructions (Addendum)
Avoid frequent bending and stooping  No lifting greater than 10 lbs. May use ice or moist heat for pain. Weight loss is of benefit. Best medication for lumbar disc disease is arthritis medications that you can not take due to arthritis meds affecting your kidneys. Exercise is important to improve your indurance and does allow people to function better inspite of back pain. Knee is suffering from osteoarthritis, only real proven treatments are Well padded shoes help. Ice the knee that is suffering from osteoarthritis, only real proven treatments are Weight loss and exercise. Ice the Well padded shoes help.knee 2-3 times a day 15-20 mins at a time.-3 times a day 15-20 mins at a time. Hot showers in the AM.  Injection with steroid may be of benefit. Hemp CBD capsules, amazon.com 5,000-7,000 mg per bottle, 60 capsules per bottle, take one capsule twice a day. Cane in the left hand to use with left leg weight bearing. Follow-Up Instructions: No follow-ups on file.   Appt with Dr. Ninfa Linden to assess for left TKR.

## 2021-10-19 DIAGNOSIS — Z79899 Other long term (current) drug therapy: Secondary | ICD-10-CM | POA: Diagnosis not present

## 2021-10-24 ENCOUNTER — Ambulatory Visit: Payer: Medicare Other | Admitting: Physician Assistant

## 2021-10-29 ENCOUNTER — Ambulatory Visit (INDEPENDENT_AMBULATORY_CARE_PROVIDER_SITE_OTHER): Payer: Medicare Other | Admitting: Physician Assistant

## 2021-10-29 ENCOUNTER — Telehealth: Payer: Self-pay

## 2021-10-29 ENCOUNTER — Encounter: Payer: Self-pay | Admitting: Physician Assistant

## 2021-10-29 DIAGNOSIS — M1712 Unilateral primary osteoarthritis, left knee: Secondary | ICD-10-CM

## 2021-10-29 NOTE — Progress Notes (Signed)
HPI: [name] is known to Dr. Ninf[facility] service.  Is referred by Dr. Donavan Burnet for consideration possible left total knee arthroplasty.  She underwent a right total knee arthroplasty by Dr. Ninfa [facility] in 2013 and has done well with this.  She has pain mostly on the medial aspect of her left knee.  She said no new injury.  She states currently she is does not feel she is ready for another surgery.  In she also notes the right knee really is not bothering her now.  She last had a cortisone injection injection in the knee on 04/04/2021 and has done well. Radiographs left knee are reviewed these are dated 04/04/2021.  By my read.  She has near bone-on-bone medial compartment.  Mild lateral compartment and moderate patellofemoral arthritic changes.  Review of systems: See HPI otherwise negative.  Physical exam: General well-developed well-nourished pleasant female in no acute distress. Right knee good range of motion without pain.  Left knee full extension flexion to 120 degrees.  Tenderness along medial joint line no instability valgus varus stressing left knee.  No abnormal warmth erythema.  She has have multiple cat scratches over the knee but no signs of gross infection.  Impression: Left knee tricompartmental arthritis  Plan: If a patient is really not having pain at the knee at this point time would not recommend any type of intervention particularly surgical intervention.  She is unable to take NSAIDs due to kidney failure.  Therefore recommended she continue to work on strengthening and knee and occasionally undergo a cortisone injection no more often than every 3 months.  She will let us know when she needs a cortisone injection in the knee.  Questions were encouraged and answered

## 2021-10-29 NOTE — Telephone Encounter (Signed)
Scheduled for 11/01/21 @ 2pm
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
## 2021-11-01 ENCOUNTER — Other Ambulatory Visit: Payer: Self-pay

## 2021-11-01 ENCOUNTER — Encounter: Payer: Self-pay | Admitting: Specialist

## 2021-11-01 ENCOUNTER — Ambulatory Visit (INDEPENDENT_AMBULATORY_CARE_PROVIDER_SITE_OTHER): Payer: Medicare Other | Admitting: Specialist

## 2021-11-01 VITALS — BP 133/73 | HR 75 | Ht 63.0 in | Wt 151.0 lb

## 2021-11-01 DIAGNOSIS — M4325 Fusion of spine, thoracolumbar region: Secondary | ICD-10-CM | POA: Diagnosis not present

## 2021-11-01 DIAGNOSIS — M5134 Other intervertebral disc degeneration, thoracic region: Secondary | ICD-10-CM

## 2021-11-01 DIAGNOSIS — M1712 Unilateral primary osteoarthritis, left knee: Secondary | ICD-10-CM

## 2021-11-01 DIAGNOSIS — G8929 Other chronic pain: Secondary | ICD-10-CM

## 2021-11-01 DIAGNOSIS — M4155 Other secondary scoliosis, thoracolumbar region: Secondary | ICD-10-CM | POA: Diagnosis not present

## 2021-11-01 DIAGNOSIS — M4724 Other spondylosis with radiculopathy, thoracic region: Secondary | ICD-10-CM | POA: Diagnosis not present

## 2021-11-01 DIAGNOSIS — M533 Sacrococcygeal disorders, not elsewhere classified: Secondary | ICD-10-CM

## 2021-11-01 DIAGNOSIS — M960 Pseudarthrosis after fusion or arthrodesis: Secondary | ICD-10-CM | POA: Diagnosis not present

## 2021-11-01 DIAGNOSIS — R102 Pelvic and perineal pain: Secondary | ICD-10-CM

## 2021-11-01 MED ORDER — BUPIVACAINE HCL 0.5 % IJ SOLN
4.0000 mL | INTRAMUSCULAR | Status: AC | PRN
  Administered 2021-11-01: 4 mL via INTRA_ARTICULAR

## 2021-11-01 MED ORDER — METHYLPREDNISOLONE ACETATE 40 MG/ML IJ SUSP
40.0000 mg | INTRAMUSCULAR | Status: AC | PRN
  Administered 2021-11-01: 40 mg via INTRA_ARTICULAR
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
## 2021-11-08 ENCOUNTER — Other Ambulatory Visit: Payer: Self-pay | Admitting: Internal Medicine

## 2021-11-13 ENCOUNTER — Ambulatory Visit: Payer: Medicare Other | Admitting: Internal Medicine

## 2021-11-14 ENCOUNTER — Telehealth: Payer: Self-pay

## 2021-11-14 DIAGNOSIS — F17210 Nicotine dependence, cigarettes, uncomplicated: Secondary | ICD-10-CM | POA: Diagnosis not present

## 2021-11-14 DIAGNOSIS — M545 Low back pain, unspecified: Secondary | ICD-10-CM | POA: Diagnosis not present

## 2021-11-14 DIAGNOSIS — N184 Chronic kidney disease, stage 4 (severe): Secondary | ICD-10-CM | POA: Diagnosis not present

## 2021-11-14 DIAGNOSIS — R030 Elevated blood-pressure reading, without diagnosis of hypertension: Secondary | ICD-10-CM | POA: Diagnosis not present

## 2021-11-14 DIAGNOSIS — Z79899 Other long term (current) drug therapy: Secondary | ICD-10-CM | POA: Diagnosis not present

## 2021-11-14 NOTE — Telephone Encounter (Signed)
So basically she wants to change from 0.5 mg 3 times a day to 1 mg once a day? ? ?She should also schedule a follow-up visit-I know she canceled her most recent scheduled follow-up.-Okay to schedule for the next month or 2. ?

## 2021-11-14 NOTE — Telephone Encounter (Signed)
Pt is requesting a refill: ?clonazePAM (KLONOPIN)1 MG tablet once a day per pain management doctor ? ?Pharmacy: ?[place], [place] - [place] ? ?LOV 10/04/21 ? ?

## 2021-11-15 NOTE — Telephone Encounter (Signed)
Message left patient today. ? ?If she calls back please get clarification on if she wants to take 1 mg daily. ? ?She also needs a 23-monthfollow up so please schedule if she calls back. ?

## 2021-11-16 ENCOUNTER — Other Ambulatory Visit: Payer: Self-pay | Admitting: Specialist

## 2021-11-17 ENCOUNTER — Other Ambulatory Visit: Payer: Self-pay

## 2021-11-17 ENCOUNTER — Ambulatory Visit
Admission: RE | Admit: 2021-11-17 | Discharge: 2021-11-17 | Disposition: A | Discharge status: Home / Self Care | Payer: Medicare Other | Source: Ambulatory Visit | Attending: Specialist | Admitting: Specialist

## 2021-11-17 DIAGNOSIS — G8929 Other chronic pain: Secondary | ICD-10-CM

## 2021-11-17 DIAGNOSIS — M533 Sacrococcygeal disorders, not elsewhere classified: Secondary | ICD-10-CM | POA: Diagnosis not present

## 2021-11-17 DIAGNOSIS — R600 Localized edema: Secondary | ICD-10-CM | POA: Diagnosis not present

## 2021-11-17 DIAGNOSIS — Z981 Arthrodesis status: Secondary | ICD-10-CM | POA: Diagnosis not present

## 2021-11-19 ENCOUNTER — Other Ambulatory Visit: Payer: Self-pay | Admitting: Internal Medicine

## 2021-11-19 ENCOUNTER — Other Ambulatory Visit: Payer: Self-pay | Admitting: Specialist

## 2021-11-19 DIAGNOSIS — Z79899 Other long term (current) drug therapy: Secondary | ICD-10-CM | POA: Diagnosis not present

## 2021-11-19 NOTE — Telephone Encounter (Signed)
Pt called requesting a refill of tizandine. Please send to pharmacy on file. Phone number is [phone]. ?

## 2021-11-23 DIAGNOSIS — R0602 Shortness of breath: Secondary | ICD-10-CM | POA: Diagnosis not present

## 2021-11-23 DIAGNOSIS — J449 Chronic obstructive pulmonary disease, unspecified: Secondary | ICD-10-CM | POA: Diagnosis not present

## 2021-11-26 MED ORDER — CLONAZEPAM 1 MG PO TABS: 1.0000 mg | ORAL_TABLET | Freq: Every day | ORAL | 0 refills | Status: DC

## 2021-11-26 MED ORDER — ONDANSETRON HCL 4 MG PO TABS: 4.0000 mg | ORAL_TABLET | Freq: Three times a day (TID) | ORAL | 0 refills | Status: DC | PRN

## 2021-11-26 NOTE — Addendum Note (Signed)
Addended by: [name] on: 11/26/2021 12:12 PM ? ? Modules accepted: Orders ? ?

## 2021-11-26 NOTE — Telephone Encounter (Signed)
Pt is asking for ClonazePAM (KLONOPIN) 1 mg daily . ? ?Pt is also requesting refill on  ondansetron (ZOFRAN) 4 MG tablet . ? ?FU appt scheduled 12/03/21 '@1'$ :00 ? ? ? ? ?

## 2021-11-27 ENCOUNTER — Telehealth: Payer: Self-pay | Admitting: Internal Medicine

## 2021-11-27 NOTE — Telephone Encounter (Signed)
Caller states she is pts daughter Lattie Haw ? ?Caller states pt had previous kidney failure and is starting to exhibit similar symptoms when diagnosed w/ the kidney failure  ? ?Caller states pt is exhibiting extreme fatigue and back pain, ems was previously called due to not being able to wake pt up ? ?Caller is not listed on dpr, no pt information was released ? ?Caller requesting a cb to pt w/ provider's recommendations ?

## 2021-11-28 NOTE — Telephone Encounter (Signed)
Attempted to reach patient but unable to do so. Tried to reach her yesterday as well and also sent her a my-chart message. ? ?If she needs to be seen she can come in at 3:40 if appointment is still available. ? ? ?

## 2021-11-30 ENCOUNTER — Ambulatory Visit (INDEPENDENT_AMBULATORY_CARE_PROVIDER_SITE_OTHER): Payer: Medicare Other | Admitting: Specialist

## 2021-11-30 ENCOUNTER — Encounter: Payer: Self-pay | Admitting: Specialist

## 2021-11-30 ENCOUNTER — Ambulatory Visit (INDEPENDENT_AMBULATORY_CARE_PROVIDER_SITE_OTHER): Payer: Medicare Other

## 2021-11-30 VITALS — BP 134/76 | HR 69 | Ht 63.0 in | Wt 151.0 lb

## 2021-11-30 DIAGNOSIS — M79641 Pain in right hand: Secondary | ICD-10-CM

## 2021-11-30 DIAGNOSIS — S62112A Displaced fracture of triquetrum [cuneiform] bone, left wrist, initial encounter for closed fracture: Secondary | ICD-10-CM | POA: Diagnosis not present

## 2021-11-30 DIAGNOSIS — G8929 Other chronic pain: Secondary | ICD-10-CM

## 2021-11-30 DIAGNOSIS — E882 Lipomatosis, not elsewhere classified: Secondary | ICD-10-CM

## 2021-11-30 DIAGNOSIS — M533 Sacrococcygeal disorders, not elsewhere classified: Secondary | ICD-10-CM

## 2021-11-30 DIAGNOSIS — M79642 Pain in left hand: Secondary | ICD-10-CM

## 2021-11-30 DIAGNOSIS — R29898 Other symptoms and signs involving the musculoskeletal system: Secondary | ICD-10-CM

## 2021-11-30 DIAGNOSIS — R200 Anesthesia of skin: Secondary | ICD-10-CM

## 2021-11-30 DIAGNOSIS — D1779 Benign lipomatous neoplasm of other sites: Secondary | ICD-10-CM | POA: Diagnosis not present

## 2021-11-30 DIAGNOSIS — R102 Pelvic and perineal pain unspecified side: Secondary | ICD-10-CM

## 2021-11-30 NOTE — Patient Instructions (Addendum)
Avoid frequent bending and stooping  ?No lifting greater than 10 lbs. ?May use ice or moist heat for pain. ?Weight loss is of benefit. ?Best medication for lumbar disc disease is arthritis medications like motrin, celebrex and naprosyn. ?Exercise is important to improve your indurance and does allow people to function better inspite of back pain. ? Referral for EMG/NCV of the legs and feet for bilateral foot right greater than left plantar anesthesia, tarsal tunnel vs Sacral radiculopathy.  ?SI joint injections right and left side.  ?Left wrist splint for 3 weeks then discontinue.  ?

## 2021-11-30 NOTE — Addendum Note (Signed)
Addended by: Basil Dess on: 11/30/2021 02:28 PM ? ? Modules accepted: Orders ? ?

## 2021-11-30 NOTE — Progress Notes (Addendum)
? ?Office Visit Note ?  ?Patient: Jill Phillips           ?Date of Birth: [birth year]           ?MRN: [id number] ?Visit Date: 11/30/2021 ?             ?Requested by: [name], MD ?[place],  [place] [id number] ?PCP: [name], MD ? ? ?Assessment & Plan: ?Visit Diagnoses:  ?1. Pain in left hand   ?2. Chronic left SI joint pain   ?3. Chronic right SI joint pain   ?4. Pelvic pain   ?5. Epidural lipomatosis   ?66 year old female with pelvic pain "burning rectal pain". She has had previous thoracolumbar fusion T10-S1 and is experiencing bilateral plantar numbness. Today with right peroneal n. Weakness. She underwent MRI of the sacrum with findings of epidural lipomatosis local to the S1 level with thecal impression. Potentially the thecal sac compression may relate to her symptoms. I recommend we proceed with EMG/NCV of the legs and feet and consider a sacral block to see it it can relieve her pelvic pain. A referral to a tertiary care center may be necessary to try and sort out the pain syndrome vs sacral thecal compression .  ? ?Plan: Avoid frequent bending and stooping  ?No lifting greater than 10 lbs. ?May use ice or moist heat for pain. ?Weight loss is of benefit. ?Best medication for lumbar disc disease is arthritis medications like motrin, celebrex and naprosyn. ?Exercise is important to improve your indurance and does allow people to function better inspite of back pain. ? Referral for EMG/NCV of the legs and feet for bilateral foot right greater than left plantar anesthesia, tarsal tunnel vs Sacral radiculopathy.  ?SI joint injections right and left side.  ?Referral to [facility] for assessment of sacral lipomatosis. ?Follow-Up Instructions: Return in about 3 weeks (around 12/21/2021).  ? ?Orders:  ?Orders Placed This Encounter  ?Procedures  ? XR Hand Complete Left  ? ?No orders of the defined types were placed in this encounter. ? ? ? ? Procedures: ?No procedures performed ? ? ?Clinical  Data: ?Findings:  ?CLINICAL DATA:  Chronic right SI joint pain ?  ?EXAM: ?MRI SACRUM WITHOUT CONTRAST ?  ?TECHNIQUE: ?Multiplanar multi-sequence MR imaging of the sacrum was performed. ?No intravenous contrast was administered. ?  ?COMPARISON:  MRI lumbar spine 02/14/2017, MRI lumbar spine ?08/11/2021, CT abdomen/pelvis 04/24/2021 ?  ?FINDINGS: ?Bones/Joint/Cartilage ?  ?Right total hip arthroplasty with susceptibility artifact resulting ?from the orthopedic hardware partially obscures adjacent soft tissue ?and osseous structures. ?  ?No acute fracture.  Normal alignment. No joint effusion. ?  ?Moderate osteoarthritis of the right SI joint with mild subchondral ?marrow edema. Mild osteoarthritis of the left SI joint. No SI joint ?effusion. ?  ?Posterior lumbar interbody fusion from L3 through S1 with right ?pedicle screws at L3-4. Prominence of the epidural fat at S1 ?deforming the thecal sac. ?  ?Ligaments, Muscles and Tendons ?  ?Muscles are normal. No muscle atrophy. No muscle edema. Piriformis ?muscles are normal bilaterally without signal abnormality. Left ?piriformis muscle is asymmetrically larger than the right. ?  ?Soft tissue ?No fluid collection or hematoma. No soft tissue mass. Normal ?neurovascular bundles. ?  ?IMPRESSION: ?1. Moderate osteoarthritis of the right SI joint with mild ?subchondral marrow edema. ?2. Mild osteoarthritis of the left SI joint. ?3. Posterior lumbar interbody fusion from L3 through S1 with right ?pedicle screws at L3-4. Prominence of the epidural  fat at S1 ?deforming the thecal sac. ?  ?  ?Electronically Signed ?  By: Kathreen Devoid M.D. ?  On: 11/20/2021 06:12 ?  ? ? ?Subjective: ?Chief Complaint  ?Patient presents with  ? Lower Back - Follow-up  ? Left Hand - Pain, Injury  ? ? ?66 year old female with history of thoracolumbar fusions T10 to S1. She has been experiencing pelvic pain "rectal pain" now for several years and underwent a recent MRI of the sacrum and lower pelvis. She  relates that she is experiencing pain in the right planter foot with dense numbness over the bottoms of both feet. She has had previous SI joint injections and previous thoracic intercostal blocks complicated by a  ?Pneumothorax in the past and would prefer injections by [facility] if necessary. The results of the MRI of the pelvis and sacrum is reviewed with her. She notes that she had a mechanical fall on Wednesday when her left foot and leg caught and she twisted and fell onto the left Hand wrist and left shoulder. Has had swelling and pain along the left ulnar metacarpal carpal area and over the dorsal left hand. She also had a scratch from her cat to the left dorsal hand about a week ago. Cut her right little toe tip while cutting her nails.  ? ?Review of Systems  ?Constitutional: Negative.   ?HENT: Negative.    ?Eyes: Negative.   ?Respiratory: Negative.    ?Cardiovascular: Negative.   ?Gastrointestinal: Negative.   ?Endocrine: Negative.   ?Genitourinary: Negative.   ?Musculoskeletal: Negative.   ?Skin: Negative.   ?Allergic/Immunologic: Negative.   ?Neurological: Negative.   ?Hematological: Negative.   ?Psychiatric/Behavioral: Negative.    ? ? ?Objective: ?Vital Signs: BP 134/76 (BP Location: Left Arm, Patient Position: Sitting)   Pulse 69   Ht '5\' 3"'$  (1.6 m)   Wt 151 lb (68.5 kg)   BMI 26.75 kg/m?  ? ?Physical Exam ?Constitutional:   ?   Appearance: She is well-developed.  ?HENT:  ?   Head: Normocephalic and atraumatic.  ?Eyes:  ?   Pupils: Pupils are equal, round, and reactive to light.  ?Pulmonary:  ?   Effort: Pulmonary effort is normal.  ?   Breath sounds: Normal breath sounds.  ?Abdominal:  ?   General: Bowel sounds are normal.  ?   Palpations: Abdomen is soft.  ?Musculoskeletal:  ?   Cervical back: Normal range of motion and neck supple.  ?   Lumbar back: Negative right straight leg raise test and negative left straight leg raise test.  ?Skin: ?   General: Skin is warm and dry.  ?Neurological:  ?    Mental Status: She is alert and oriented to person, place, and time.  ?Psychiatric:     ?   Behavior: Behavior normal.     ?   Thought Content: Thought content normal.     ?   Judgment: Judgment normal.  ? ?Back Exam  ? ?Tenderness  ?The patient is experiencing tenderness in the lumbar and sacroiliac. ? ?Range of Motion  ?Extension:  abnormal  ?Flexion:  abnormal  ?Lateral bend right:  abnormal  ?Lateral bend left:  abnormal  ?Rotation right:  abnormal  ?Rotation left:  abnormal  ? ?Muscle Strength  ?Right Quadriceps:  5/5  ?Left Quadriceps:  5/5  ?Right Hamstrings:  5/5  ?Left Hamstrings:  5/5  ? ?Tests  ?Straight leg raise right: negative ?Straight leg raise left: negative ? ?Reflexes  ?Patellar:  0/4 ?Achilles:  0/4 ?Babinski's sign: normal  ? ?Other  ?Toe walk: abnormal ?Heel walk: abnormal ?Sensation: decreased ?Gait: drop-foot  ?Erythema: no back redness ?Scars: present ? ?Comments:  Right peroneal weakness 3/5 ?Anesthesia bilateral plantar surfaces.  ? ? ? ?Specialty Comments:  ?No specialty comments available. ? ?Imaging: ?No results found. ? ? ?PMFS History: ?Patient Active Problem List  ? Diagnosis Date Noted  ? Degenerative disc disease, lumbar 08/01/2015  ?  Priority: High  ?  Class: Chronic  ? Spondylolisthesis of lumbar region 08/01/2015  ?  Priority: High  ?  Class: Chronic  ? Onychomycosis 10/04/2021  ? Diarrhea 10/04/2021  ? Itching 07/16/2021  ? AKI (acute kidney injury) ([facility]) 02/04/2021  ? Overdose 02/04/2021  ? Acute gout 06/20/2020  ? Interstitial cystitis 01/21/2020  ? Status post shoulder surgery 09/09/2019  ? Nausea 08/19/2019  ? Anxiety 05/04/2019  ? Insomnia 05/04/2019  ? Syncope 12/08/2018  ? Disorder of rotator cuff syndrome of left shoulder and allied disorder 06/22/2018  ? Bilateral leg edema 06/04/2018  ? Type 2 diabetes mellitus without complication, without long-term current use of insulin ([facility]) 12/02/2017  ? Sacral pain 09/17/2017  ? COPD (chronic obstructive pulmonary disease)  ([facility]) 04/21/2017  ? Lower back pain 03/03/2017  ? CKD (chronic kidney disease) stage 3, GFR 30-59 ml/min ([facility]) 08/07/2016  ? GERD (gastroesophageal reflux disease) 08/07/2016  ? Chronic respiratory failure ([facility]) 1

## 2021-12-02 NOTE — Progress Notes (Deleted)
? ? ? ? ?Subjective:  ? ? Patient ID: Jill Phillips, female    DOB: 07-17-1956, 66 y.o.   MRN: [id number] ? ?This visit occurred during the SARS-CoV-2 public health emergency.  Safety protocols were in place, including screening questions prior to the visit, additional usage of staff PPE, and extensive cleaning of exam room while observing appropriate contact time as indicated for disinfecting solutions.   ? ? ?HPI ?Jill Phillips is here for follow up of her chronic medical problems, including htn, anxiety, rls ? ? ? ?Medications and allergies reviewed with patient and updated if appropriate. ? ?Current Outpatient Medications on File Prior to Visit  ?Medication Sig Dispense Refill  ? acetaminophen (TYLENOL) 500 MG tablet Take 1,500 mg by mouth daily as needed for moderate pain or headache.    ? albuterol (PROVENTIL) (2.5 MG/3ML) 0.083% nebulizer solution Take 3 mLs (2.5 mg total) by nebulization every 4 (four) hours as needed for wheezing or shortness of breath. (Patient not taking: Reported on 09/18/2021) 75 mL 2  ? albuterol (VENTOLIN HFA) 108 (90 Base) MCG/ACT inhaler INHALE 2 PUFFS INTO THE LUNGS EVERY 6 HOURS AS NEEDED FOR WHEEZING OR SHORTNESS OF BREATH. 8.5 g 0  ? amLODipine-benazepril (LOTREL) 10-40 MG capsule TAKE 1 CAPSULE BY MOUTH DAILY. 90 capsule 1  ? atorvastatin (LIPITOR) 20 MG tablet TAKE 1 TABLET BY MOUTH DAILY. 90 tablet 1  ? Carboxymethylcellul-Glycerin (LUBRICATING EYE DROPS OP) Place 1 drop into both eyes daily as needed (dry eyes).    ? ciclopirox (PENLAC) 8 % solution Apply topically at bedtime. Apply over nail and surrounding skin. Apply daily over previous coat. After seven (7) days, may remove with alcohol and continue cycle. 6.6 mL 0  ? cimetidine (TAGAMET) 400 MG tablet Take 400 mg by mouth 2 (two) times daily.    ? clonazePAM (KLONOPIN) 1 MG tablet Take 1 tablet (1 mg total) by mouth daily. 30 tablet 0  ? docusate sodium (COLACE) 100 MG capsule Take 200 mg by mouth at bedtime.    ? fentaNYL  (DURAGESIC) 12 MCG/HR Place 1 patch onto the skin every 3 (three) days.    ? gabapentin (NEURONTIN) 400 MG capsule TAKE 1 CAPSULE BY MOUTH 3 TIMES DAILY. 270 capsule 0  ? hydrochlorothiazide (MICROZIDE) 12.5 MG capsule Take 12.5 mg by mouth daily as needed (headaches).    ? HYDROcodone-acetaminophen (NORCO) 10-325 MG tablet Take 1 tablet by mouth every 6 (six) hours as needed for severe pain (Chronic back pain). (Patient taking differently: Take 1-4 tablets by mouth 3 (three) times daily.) 84 tablet 0  ? levothyroxine (SYNTHROID) 88 MCG tablet TAKE 1 TABLET BY MOUTH DAILY. 90 tablet 1  ? Melatonin 10 MG TABS Take 30 mg by mouth at bedtime.    ? NARCAN 4 MG/0.1ML LIQD nasal spray kit Place 1 spray into the nose as needed (accidental overdose). 1 each 0  ? Nebivolol HCl 20 MG TABS Take 1 tablet (20 mg total) by mouth daily. 90 tablet 1  ? omeprazole (PRILOSEC) 40 MG capsule TAKE 1 CAPSULE BY MOUTH 2 TIMES DAILY BEFORE A MEAL. 180 capsule 3  ? ondansetron (ZOFRAN) 4 MG tablet Take 1 tablet (4 mg total) by mouth every 8 (eight) hours as needed for nausea or vomiting. 30 tablet 0  ? QUEtiapine (SEROQUEL) 100 MG tablet Take 1.5 tablets (150 mg total) by mouth at bedtime. 135 tablet 1  ? rOPINIRole (REQUIP) 1 MG tablet TAKE 1 TO 2 TABLETS BY MOUTH TWO TIMES DAILY  AS NEEDED (Patient taking differently: Take 1-2 mg by mouth See admin instructions. Take 1 mg in the afternoon and 2 mg at night) 120 tablet 3  ? solifenacin (VESICARE) 5 MG tablet Take 5 mg by mouth 2 (two) times daily.    ? tiotropium (SPIRIVA HANDIHALER) 18 MCG inhalation capsule Place 1 capsule (18 mcg total) into inhaler and inhale daily. 30 capsule 12  ? tiZANidine (ZANAFLEX) 4 MG tablet TAKE 1 TABLET BY MOUTH EVERY 8 HOURS AS NEEDED 90 tablet 2  ? valACYclovir (VALTREX) 500 MG tablet Take 1 tablet (500 mg total) by mouth daily. 90 tablet 1  ? venlafaxine XR (EFFEXOR-XR) 150 MG 24 hr capsule TAKE 2 CAPSULES BY MOUTH DAILY. ***NEED OFFICE VISIT FOR REFILLS***  90 capsule 0  ? ?No current facility-administered medications on file prior to visit.  ? ? ? ?Review of Systems ? ?   ?Objective:  ?There were no vitals filed for this visit. ?BP Readings from Last 3 Encounters:  ?11/30/21 134/76  ?11/01/21 133/73  ?10/17/21 (!) 155/82  ? ?Wt Readings from Last 3 Encounters:  ?11/30/21 151 lb (68.5 kg)  ?11/01/21 151 lb (68.5 kg)  ?10/17/21 151 lb 12.8 oz (68.9 kg)  ? ?There is no height or weight on file to calculate BMI. ? ?  ?Physical Exam ?   ? ?Lab Results  ?Component Value Date  ? WBC 8.5 06/23/2021  ? HGB 12.2 06/23/2021  ? HCT 36.0 06/23/2021  ? PLT 245 06/23/2021  ? GLUCOSE 81 07/16/2021  ? CHOL 183 07/16/2021  ? TRIG 254.0 (H) 07/16/2021  ? HDL 37.70 (L) 07/16/2021  ? LDLDIRECT 110.0 07/16/2021  ? Fossil 86 11/03/2018  ? ALT 10 07/16/2021  ? AST 17 07/16/2021  ? NA 141 07/16/2021  ? K 4.0 07/16/2021  ? CL 104 07/16/2021  ? CREATININE 1.31 (H) 07/16/2021  ? BUN 16 07/16/2021  ? CO2 28 07/16/2021  ? TSH 1.47 12/29/2020  ? INR 0.87 05/26/2012  ? HGBA1C 6.3 07/16/2021  ? ? ? ?Assessment & Plan:  ? ? ?See Problem List for Assessment and Plan of chronic medical problems.  ? ? ?

## 2021-12-03 ENCOUNTER — Ambulatory Visit: Payer: Medicare Other | Admitting: Internal Medicine

## 2021-12-03 DIAGNOSIS — F419 Anxiety disorder, unspecified: Secondary | ICD-10-CM

## 2021-12-03 DIAGNOSIS — G2581 Restless legs syndrome: Secondary | ICD-10-CM

## 2021-12-03 DIAGNOSIS — I10 Essential (primary) hypertension: Secondary | ICD-10-CM

## 2021-12-10 ENCOUNTER — Other Ambulatory Visit: Payer: Self-pay

## 2021-12-10 ENCOUNTER — Encounter: Payer: Self-pay | Admitting: Neurology

## 2021-12-10 DIAGNOSIS — R202 Paresthesia of skin: Secondary | ICD-10-CM

## 2021-12-14 ENCOUNTER — Telehealth: Payer: Self-pay

## 2021-12-14 NOTE — Telephone Encounter (Signed)
Spoke with patient today. ? ?She will try to find ride here. ?

## 2021-12-14 NOTE — Telephone Encounter (Signed)
Pt is requesting labs for kidney function be ordered. Pt reports that she feels SOB and doesn't know if its related to Kidney failure. ? ?Pt has appt on 12/18/21. ? ?Please advise ?

## 2021-12-18 ENCOUNTER — Ambulatory Visit: Payer: Medicare Other | Admitting: Internal Medicine

## 2021-12-18 DIAGNOSIS — R030 Elevated blood-pressure reading, without diagnosis of hypertension: Secondary | ICD-10-CM | POA: Diagnosis not present

## 2021-12-18 DIAGNOSIS — N184 Chronic kidney disease, stage 4 (severe): Secondary | ICD-10-CM | POA: Diagnosis not present

## 2021-12-18 DIAGNOSIS — F17210 Nicotine dependence, cigarettes, uncomplicated: Secondary | ICD-10-CM | POA: Diagnosis not present

## 2021-12-18 DIAGNOSIS — Z79899 Other long term (current) drug therapy: Secondary | ICD-10-CM | POA: Diagnosis not present

## 2021-12-18 DIAGNOSIS — M545 Low back pain, unspecified: Secondary | ICD-10-CM | POA: Diagnosis not present

## 2021-12-20 ENCOUNTER — Ambulatory Visit: Payer: Medicare Other | Admitting: Internal Medicine

## 2021-12-20 ENCOUNTER — Ambulatory Visit: Payer: Medicare Other | Admitting: Specialist

## 2021-12-21 DIAGNOSIS — Z79899 Other long term (current) drug therapy: Secondary | ICD-10-CM | POA: Diagnosis not present

## 2021-12-24 DIAGNOSIS — J449 Chronic obstructive pulmonary disease, unspecified: Secondary | ICD-10-CM | POA: Diagnosis not present

## 2021-12-24 DIAGNOSIS — R0602 Shortness of breath: Secondary | ICD-10-CM | POA: Diagnosis not present

## 2021-12-25 DIAGNOSIS — H5203 Hypermetropia, bilateral: Secondary | ICD-10-CM | POA: Diagnosis not present

## 2021-12-25 DIAGNOSIS — H35033 Hypertensive retinopathy, bilateral: Secondary | ICD-10-CM | POA: Diagnosis not present

## 2021-12-25 DIAGNOSIS — H524 Presbyopia: Secondary | ICD-10-CM | POA: Diagnosis not present

## 2021-12-25 DIAGNOSIS — H2513 Age-related nuclear cataract, bilateral: Secondary | ICD-10-CM | POA: Diagnosis not present

## 2021-12-26 ENCOUNTER — Other Ambulatory Visit: Payer: Self-pay | Admitting: Internal Medicine

## 2021-12-27 ENCOUNTER — Other Ambulatory Visit: Payer: Self-pay | Admitting: Internal Medicine

## 2022-01-02 ENCOUNTER — Other Ambulatory Visit: Payer: Self-pay | Admitting: Internal Medicine

## 2022-01-08 ENCOUNTER — Other Ambulatory Visit: Payer: Self-pay | Admitting: Internal Medicine

## 2022-01-09 ENCOUNTER — Telehealth: Payer: Self-pay

## 2022-01-09 ENCOUNTER — Ambulatory Visit (INDEPENDENT_AMBULATORY_CARE_PROVIDER_SITE_OTHER): Payer: Medicare Other | Admitting: Orthopaedic Surgery

## 2022-01-09 DIAGNOSIS — M25562 Pain in left knee: Secondary | ICD-10-CM | POA: Diagnosis not present

## 2022-01-09 DIAGNOSIS — M1712 Unilateral primary osteoarthritis, left knee: Secondary | ICD-10-CM | POA: Insufficient documentation

## 2022-01-09 DIAGNOSIS — G8929 Other chronic pain: Secondary | ICD-10-CM | POA: Diagnosis not present

## 2022-01-09 NOTE — Telephone Encounter (Signed)
Noted  

## 2022-01-09 NOTE — Telephone Encounter (Signed)
Left knee gel injection ?

## 2022-01-09 NOTE — Progress Notes (Signed)
The patient comes in today to evaluate and treat chronic left knee pain.  She has been seen for her knee before.  Just 2 months ago she had a steroid injection in the left knee by Dr. [name] and she says it only helped some.  Most of her pain is in the back of the knee and it hurts more with weightbearing.  I actually replaced her right knee almost 10 years ago.  She is that surgery was easy in her knee is done well in the right side.  She is on chronic pain medications.  She is a thin individual.  She cannot take anti-inflammatories due to kidney disease. ? ?Examination of her right knee shows global tenderness mainly though at the medial joint line and posterior medially.  There is some patellofemoral crepitation.  There is no malalignment of the knee and no effusion today.  There is good range of motion of the knee but painful. ? ?X-rays are reviewed reviewed from 2022 of the left knee showing varus malalignment with significant narrowing of the medial joint space in the patellofemoral joint. ? ?Since she has failed conservative treatment with a steroid injection in the left knee, the next conservative option will be trying hyaluronic acid for her left knee to treat the pain from osteoarthritis.  She agrees with this treatment plan.  Hopefully we can get this ordered and approved for her. ?

## 2022-01-10 ENCOUNTER — Encounter: Payer: Self-pay | Admitting: Internal Medicine

## 2022-01-10 NOTE — Patient Instructions (Addendum)
? ? ? ?  Blood work was ordered.   ? ? ?Medications changes include :   none ? ? ? ? ? ?Return in about 6 months (around 07/14/2022) for follow up. ? ?

## 2022-01-10 NOTE — Progress Notes (Signed)
? ? ?Subjective:  ? ? Patient ID: Jill Phillips, female    DOB: 12/30/1955, 66 y.o.   MRN: [id number] ? ? ? ? ? ?HPI ?Creasie is here for  ?Chief Complaint  ?Patient presents with  ? Nausea  ?  Patient would like to have phenergan vs the Zofran. She is asking for 50 mg. Also she states the Requip doesn't work.  ? ? ?Zofran is not helping. Nausea x at least a couple of months.  ? ?She is having nausea mid morning.  Some days she is sick all day long.  She has drank some pepto bismol and it helps a little.  She denies GERD, abdominal pain.  Not related to medication.  Some days she can not eat much.   ? ?She is constipated.  She may have a BM every 3 days.  The nausea may be better the days she has a BM.  She takes a vegetable laxative.  She usually takes a stool softener.  She is currently out of those meds.  She does think the nausea might be worse in the days that she is more constipated. ? ? ?RLS is worse - has had it since 14.  Requip does not seem to be working as well.  She has been taking Benadryl on occasion for allergy symptoms. ? ?Depression/anxiety - well controlled ? ? ?Nervous feeling in chest when lays down.  Feels like anervous feeling.  Feels fast HR -- not HR - nervous feeling in chest. ? ?Medications and allergies reviewed with patient and updated if appropriate. ? ?Current Outpatient Medications on File Prior to Visit  ?Medication Sig Dispense Refill  ? acetaminophen (TYLENOL) 500 MG tablet Take 1,500 mg by mouth daily as needed for moderate pain or headache.    ? albuterol (VENTOLIN HFA) 108 (90 Base) MCG/ACT inhaler INHALE 2 PUFFS INTO THE LUNGS EVERY 6 HOURS AS NEEDED FOR WHEEZING OR SHORTNESS OF BREATH. 8.5 g 0  ? amLODipine-benazepril (LOTREL) 10-40 MG capsule TAKE 1 CAPSULE BY MOUTH DAILY. 90 capsule 1  ? atorvastatin (LIPITOR) 20 MG tablet TAKE 1 TABLET BY MOUTH DAILY. 90 tablet 1  ? clonazePAM (KLONOPIN) 1 MG tablet TAKE 1 TABLET BY MOUTH DAILY. 30 tablet 0  ? docusate sodium (COLACE) 100 MG  capsule Take 200 mg by mouth at bedtime.    ? fentaNYL (DURAGESIC) 12 MCG/HR Place 1 patch onto the skin every 3 (three) days.    ? gabapentin (NEURONTIN) 400 MG capsule TAKE 1 CAPSULE BY MOUTH 3 TIMES DAILY. 270 capsule 0  ? HYDROcodone-acetaminophen (NORCO) 10-325 MG tablet Take 1 tablet by mouth every 6 (six) hours as needed for severe pain (Chronic back pain). (Patient taking differently: Take 1-4 tablets by mouth 3 (three) times daily.) 84 tablet 0  ? levothyroxine (SYNTHROID) 88 MCG tablet TAKE 1 TABLET BY MOUTH DAILY. 90 tablet 1  ? Melatonin 10 MG TABS Take 30 mg by mouth at bedtime.    ? NARCAN 4 MG/0.1ML LIQD nasal spray kit Place 1 spray into the nose as needed (accidental overdose). 1 each 0  ? Nebivolol HCl 20 MG TABS Take 1 tablet (20 mg total) by mouth daily. 90 tablet 1  ? omeprazole (PRILOSEC) 40 MG capsule TAKE 1 CAPSULE BY MOUTH 2 TIMES DAILY BEFORE A MEAL. 180 capsule 3  ? QUEtiapine (SEROQUEL) 100 MG tablet Take 1.5 tablets (150 mg total) by mouth at bedtime. 135 tablet 1  ? rOPINIRole (REQUIP) 1 MG tablet TAKE 1 TO  2 TABLETS BY MOUTH TWO TIMES DAILY AS NEEDED 120 tablet 2  ? solifenacin (VESICARE) 5 MG tablet Take 5 mg by mouth 2 (two) times daily.    ? tiotropium (SPIRIVA HANDIHALER) 18 MCG inhalation capsule Place 1 capsule (18 mcg total) into inhaler and inhale daily. 30 capsule 12  ? tiZANidine (ZANAFLEX) 4 MG tablet TAKE 1 TABLET BY MOUTH EVERY 8 HOURS AS NEEDED 90 tablet 2  ? valACYclovir (VALTREX) 500 MG tablet Take 1 tablet (500 mg total) by mouth daily. 90 tablet 1  ? albuterol (PROVENTIL) (2.5 MG/3ML) 0.083% nebulizer solution Take 3 mLs (2.5 mg total) by nebulization every 4 (four) hours as needed for wheezing or shortness of breath. (Patient not taking: Reported on 09/18/2021) 75 mL 2  ? ?No current facility-administered medications on file prior to visit.  ? ? ?Review of Systems  ?Constitutional:  Negative for fever.  ?Respiratory:  Positive for cough and shortness of breath.  Negative for wheezing.   ?Cardiovascular:  Negative for chest pain and palpitations.  ?Gastrointestinal:  Positive for constipation and nausea. Negative for abdominal pain.  ?Neurological:  Negative for dizziness and headaches.  ?Psychiatric/Behavioral:  Positive for dysphoric mood. The patient is nervous/anxious.   ? ?   ?Objective:  ? ?Vitals:  ? 01/11/22 1042  ?BP: 138/90  ?Pulse: 69  ?Temp: 98.2 ?F (36.8 ?C)  ?SpO2: 96%  ? ?BP Readings from Last 3 Encounters:  ?01/11/22 138/90  ?11/30/21 134/76  ?11/01/21 133/73  ? ?Wt Readings from Last 3 Encounters:  ?01/11/22 150 lb 9.6 oz (68.3 kg)  ?11/30/21 151 lb (68.5 kg)  ?11/01/21 151 lb (68.5 kg)  ? ?Body mass index is 26.68 kg/m?. ? ?  ?Physical Exam ?Constitutional:   ?   General: She is not in acute distress. ?   Appearance: Normal appearance.  ?HENT:  ?   Head: Normocephalic and atraumatic.  ?Eyes:  ?   Conjunctiva/sclera: Conjunctivae normal.  ?Cardiovascular:  ?   Rate and Rhythm: Normal rate and regular rhythm.  ?   Heart sounds: Normal heart sounds. No murmur heard. ?Pulmonary:  ?   Effort: Pulmonary effort is normal. No respiratory distress.  ?   Breath sounds: Normal breath sounds. No wheezing.  ?Musculoskeletal:  ?   Cervical back: Neck supple.  ?   Right lower leg: No edema.  ?   Left lower leg: No edema.  ?Lymphadenopathy:  ?   Cervical: No cervical adenopathy.  ?Skin: ?   General: Skin is warm and dry.  ?   Findings: No rash.  ?Neurological:  ?   Mental Status: She is alert. Mental status is at baseline.  ?Psychiatric:     ?   Mood and Affect: Mood normal.     ?   Behavior: Behavior normal.  ? ?   ? ? ? ? ? ?Assessment & Plan:  ? ? ?See Problem List for Assessment and Plan of chronic medical problems.  ? ? ? ? ?

## 2022-01-11 ENCOUNTER — Ambulatory Visit (INDEPENDENT_AMBULATORY_CARE_PROVIDER_SITE_OTHER): Payer: Medicare Other | Admitting: Internal Medicine

## 2022-01-11 VITALS — BP 138/90 | HR 69 | Temp 98.2°F | Ht 63.0 in | Wt 150.6 lb

## 2022-01-11 DIAGNOSIS — E7849 Other hyperlipidemia: Secondary | ICD-10-CM

## 2022-01-11 DIAGNOSIS — G2581 Restless legs syndrome: Secondary | ICD-10-CM

## 2022-01-11 DIAGNOSIS — E119 Type 2 diabetes mellitus without complications: Secondary | ICD-10-CM

## 2022-01-11 DIAGNOSIS — G4709 Other insomnia: Secondary | ICD-10-CM | POA: Diagnosis not present

## 2022-01-11 DIAGNOSIS — I10 Essential (primary) hypertension: Secondary | ICD-10-CM | POA: Diagnosis not present

## 2022-01-11 DIAGNOSIS — E038 Other specified hypothyroidism: Secondary | ICD-10-CM | POA: Diagnosis not present

## 2022-01-11 DIAGNOSIS — N1832 Chronic kidney disease, stage 3b: Secondary | ICD-10-CM

## 2022-01-11 DIAGNOSIS — R110 Nausea: Secondary | ICD-10-CM | POA: Diagnosis not present

## 2022-01-11 DIAGNOSIS — J439 Emphysema, unspecified: Secondary | ICD-10-CM

## 2022-01-11 LAB — COMPREHENSIVE METABOLIC PANEL
ALT: 8 U/L (ref 0–35)
AST: 14 U/L (ref 0–37)
Albumin: 4.3 g/dL (ref 3.5–5.2)
Alkaline Phosphatase: 139 U/L — ABNORMAL HIGH (ref 39–117)
BUN: 14 mg/dL (ref 6–23)
CO2: 29 mEq/L (ref 19–32)
Calcium: 9.6 mg/dL (ref 8.4–10.5)
Chloride: 104 mEq/L (ref 96–112)
Creatinine, Ser: 0.97 mg/dL (ref 0.40–1.20)
GFR: 60.71 mL/min (ref >60.00)
Glucose, Bld: 85 mg/dL (ref 70–99)
Potassium: 4.5 mEq/L (ref 3.5–5.1)
Sodium: 142 mEq/L (ref 135–145)
Total Bilirubin: 0.4 mg/dL (ref 0.2–1.2)
Total Protein: 7.3 g/dL (ref 6.0–8.3)

## 2022-01-11 LAB — LIPID PANEL
Cholesterol: 161 mg/dL (ref 0–200)
HDL: 40.4 mg/dL (ref >39.00)
LDL Cholesterol: 86 mg/dL (ref 0–99)
NonHDL: 120.72
Total CHOL/HDL Ratio: 4
Triglycerides: 176 mg/dL — ABNORMAL HIGH (ref 0.0–149.0)
VLDL: 35.2 mg/dL (ref 0.0–40.0)

## 2022-01-11 LAB — CBC WITH DIFFERENTIAL/PLATELET
Basophils Absolute: 0 10*3/uL (ref 0.0–0.1)
Basophils Relative: 0.5 % (ref 0.0–3.0)
Eosinophils Absolute: 0.1 10*3/uL (ref 0.0–0.7)
Eosinophils Relative: 1.8 % (ref 0.0–5.0)
HCT: 43 % (ref 36.0–46.0)
Hemoglobin: 14.2 g/dL (ref 12.0–15.0)
Lymphocytes Relative: 30 % (ref 12.0–46.0)
Lymphs Abs: 2.4 10*3/uL (ref 0.7–4.0)
MCHC: 32.9 g/dL (ref 30.0–36.0)
MCV: 91.9 fl (ref 78.0–100.0)
Monocytes Absolute: 0.5 10*3/uL (ref 0.1–1.0)
Monocytes Relative: 6.6 % (ref 3.0–12.0)
Neutro Abs: 4.9 10*3/uL (ref 1.4–7.7)
Neutrophils Relative %: 61.1 % (ref 43.0–77.0)
Platelets: 248 10*3/uL (ref 150.0–400.0)
RBC: 4.68 Mil/uL (ref 3.87–5.11)
RDW: 15 % (ref 11.5–15.5)
WBC: 8 10*3/uL (ref 4.0–10.5)

## 2022-01-11 LAB — MICROALBUMIN / CREATININE URINE RATIO
Creatinine,U: 94.2 mg/dL
Microalb Creat Ratio: 0.8 mg/g (ref 0.0–30.0)
Microalb, Ur: 0.8 mg/dL (ref 0.0–1.9)

## 2022-01-11 LAB — VITAMIN D 25 HYDROXY (VIT D DEFICIENCY, FRACTURES): VITD: 78.39 ng/mL (ref 30.00–100.00)

## 2022-01-11 LAB — TSH: TSH: 1.69 u[IU]/mL (ref 0.35–5.50)

## 2022-01-11 LAB — HEMOGLOBIN A1C: Hgb A1c MFr Bld: 6.3 % (ref 4.6–6.5)

## 2022-01-11 MED ORDER — VENLAFAXINE HCL ER 150 MG PO CP24: ORAL_CAPSULE | ORAL | 1 refills | Status: DC

## 2022-01-11 NOTE — Assessment & Plan Note (Signed)
Chronic ?Symptoms worse ?Currently on clonazepam at night, gabapentin at night, tizanidine at night and Requip ?Discussed that she could try increasing the gabapentin or changing Requip to Mirapex to see if that helps-pain doctor has been prescribing the Requip so she will discuss with him ?Discussed avoiding antihistamines, antinausea medications ?Discussed if the time is right considering decreasing Effexor which may also help ?Stressed increased fluid intake-avoiding dehydration ?

## 2022-01-11 NOTE — Assessment & Plan Note (Signed)
Chronic  Clinically euthyroid Currently taking levothyroxine 88 mcg daily Check tsh  Titrate med dose if needed  

## 2022-01-11 NOTE — Assessment & Plan Note (Signed)
Chronic °Regular exercise and healthy diet encouraged °Check lipid panel  °Continue atorvastatin 20 mg daily °

## 2022-01-11 NOTE — Assessment & Plan Note (Signed)
Subacute ?Started about 2 months ago ?She does have chronic constipation and probably has a bowel movement every 3 days at times ?She does think nausea may be worse when she is more constipated ?Currently ran out of laxative and stool softener-we will restart ?Takes vegetable laxative and stool softener daily and typically this controls her constipation ?Recommend avoiding antinausea medications-may make RLS worse ?

## 2022-01-11 NOTE — Assessment & Plan Note (Signed)
Chronic ?Stressed increase fluids ?No longer taking NSAIDs ?CMP ?

## 2022-01-11 NOTE — Assessment & Plan Note (Signed)
Chronic ?Blood pressure well controlled ?CMP ?Continue amlodipine-benazepril 10-40 mg daily, nebivolol 20 mg daily ?

## 2022-01-11 NOTE — Assessment & Plan Note (Signed)
Chronic ?Has chronic shortness of breath with exertion ?Occasional cough/wheeze ?Stressed that this is related to smoking and most likely will get worse ?Encouraged smoking cessation ?Continue Spiriva daily ?

## 2022-01-11 NOTE — Assessment & Plan Note (Signed)
Chronic ?Lab Results  ?Component Value Date  ? HGBA1C 6.3 07/16/2021  ? ?Sugars well controlled ?Check A1c, urine microalbumin today ?Continue diet control ?Stressed diabetic diet ? ? ?

## 2022-01-11 NOTE — Assessment & Plan Note (Signed)
Chronic ?Controlled, Stable ?Continue Seroquel 150 mg at bedtime ?

## 2022-01-14 ENCOUNTER — Ambulatory Visit: Payer: Medicare Other | Admitting: Specialist

## 2022-01-14 ENCOUNTER — Other Ambulatory Visit: Payer: Self-pay | Admitting: Internal Medicine

## 2022-01-15 DIAGNOSIS — M545 Low back pain, unspecified: Secondary | ICD-10-CM | POA: Diagnosis not present

## 2022-01-15 DIAGNOSIS — G2581 Restless legs syndrome: Secondary | ICD-10-CM | POA: Diagnosis not present

## 2022-01-15 DIAGNOSIS — Z79899 Other long term (current) drug therapy: Secondary | ICD-10-CM | POA: Diagnosis not present

## 2022-01-15 DIAGNOSIS — F17210 Nicotine dependence, cigarettes, uncomplicated: Secondary | ICD-10-CM | POA: Diagnosis not present

## 2022-01-17 DIAGNOSIS — Z79899 Other long term (current) drug therapy: Secondary | ICD-10-CM | POA: Diagnosis not present

## 2022-01-23 ENCOUNTER — Other Ambulatory Visit: Payer: Self-pay | Admitting: Internal Medicine

## 2022-01-23 DIAGNOSIS — J449 Chronic obstructive pulmonary disease, unspecified: Secondary | ICD-10-CM | POA: Diagnosis not present

## 2022-01-23 DIAGNOSIS — R0602 Shortness of breath: Secondary | ICD-10-CM | POA: Diagnosis not present

## 2022-02-01 ENCOUNTER — Telehealth: Payer: Self-pay | Admitting: Internal Medicine

## 2022-02-01 MED ORDER — HYDROCHLOROTHIAZIDE 12.5 MG PO TABS: 12.5000 mg | ORAL_TABLET | Freq: Every day | ORAL | 3 refills | Status: DC

## 2022-02-01 MED ORDER — PROMETHAZINE HCL 50 MG PO TABS: 50.0000 mg | ORAL_TABLET | Freq: Three times a day (TID) | ORAL | 0 refills | Status: DC | PRN

## 2022-02-01 NOTE — Telephone Encounter (Signed)
Pt has been having intense headaches for 5 days and has been very stressed, she would like [place] to call in hydrochlorothiazide.

## 2022-02-07 ENCOUNTER — Other Ambulatory Visit: Payer: Self-pay | Admitting: Internal Medicine

## 2022-02-08 ENCOUNTER — Telehealth: Payer: Self-pay

## 2022-02-08 NOTE — Telephone Encounter (Signed)
VOB submitted for Durolane, left knee. °BV pending. °

## 2022-02-12 DIAGNOSIS — Z79899 Other long term (current) drug therapy: Secondary | ICD-10-CM | POA: Diagnosis not present

## 2022-02-12 DIAGNOSIS — M545 Low back pain, unspecified: Secondary | ICD-10-CM | POA: Diagnosis not present

## 2022-02-12 DIAGNOSIS — G2581 Restless legs syndrome: Secondary | ICD-10-CM | POA: Diagnosis not present

## 2022-02-12 DIAGNOSIS — M79641 Pain in right hand: Secondary | ICD-10-CM | POA: Diagnosis not present

## 2022-02-13 ENCOUNTER — Other Ambulatory Visit: Payer: Self-pay | Admitting: Specialist

## 2022-02-13 DIAGNOSIS — G8929 Other chronic pain: Secondary | ICD-10-CM

## 2022-02-14 ENCOUNTER — Other Ambulatory Visit: Payer: Self-pay | Admitting: Internal Medicine

## 2022-02-15 DIAGNOSIS — Z79899 Other long term (current) drug therapy: Secondary | ICD-10-CM | POA: Diagnosis not present

## 2022-02-19 ENCOUNTER — Other Ambulatory Visit: Payer: Self-pay

## 2022-02-19 ENCOUNTER — Other Ambulatory Visit: Payer: Self-pay | Admitting: Internal Medicine

## 2022-02-19 DIAGNOSIS — M1712 Unilateral primary osteoarthritis, left knee: Secondary | ICD-10-CM

## 2022-02-19 NOTE — Telephone Encounter (Signed)
Check [place] registry last filled 01/23/2022.Marland Kitchenlmb

## 2022-02-20 ENCOUNTER — Ambulatory Visit: Payer: Medicare Other | Admitting: Orthopaedic Surgery

## 2022-02-23 DIAGNOSIS — R0602 Shortness of breath: Secondary | ICD-10-CM | POA: Diagnosis not present

## 2022-02-23 DIAGNOSIS — J449 Chronic obstructive pulmonary disease, unspecified: Secondary | ICD-10-CM | POA: Diagnosis not present

## 2022-03-06 ENCOUNTER — Ambulatory Visit (INDEPENDENT_AMBULATORY_CARE_PROVIDER_SITE_OTHER): Payer: Medicare Other

## 2022-03-06 ENCOUNTER — Ambulatory Visit (INDEPENDENT_AMBULATORY_CARE_PROVIDER_SITE_OTHER): Payer: Medicare Other | Admitting: Specialist

## 2022-03-06 ENCOUNTER — Encounter: Payer: Self-pay | Admitting: Specialist

## 2022-03-06 VITALS — BP 134/78 | HR 56 | Ht 63.0 in | Wt 151.0 lb

## 2022-03-06 DIAGNOSIS — M545 Low back pain, unspecified: Secondary | ICD-10-CM

## 2022-03-06 DIAGNOSIS — M4724 Other spondylosis with radiculopathy, thoracic region: Secondary | ICD-10-CM | POA: Diagnosis not present

## 2022-03-06 NOTE — Progress Notes (Signed)
Office Visit Note   Patient: Jill Phillips           Date of Birth: 1956/08/02           MRN: [id number] Visit Date: 03/06/2022              Requested by: [name], MD [place],  [place] [address] PCP: [name], MD   Assessment & Plan: Visit Diagnoses:  1. Low back pain, unspecified back pain laterality, unspecified chronicity, unspecified whether sciatica present   2. Thoracic spondylosis with radiculopathy     Plan: Avoid frequent bending and stooping  No lifting greater than 10 lbs. May use ice or moist heat for pain. Weight loss is of benefit. Best medication for lumbar disc disease is arthritis medications that you should not take due to affect on the kidneys. Presently your kidney function is good. Exercise is important to improve your indurance and does allow people to function better inspite of back pain. Yoga and stretching exercises are helpful for patients with long fusions  Follow-Up Instructions: Return in about 3 weeks (around 03/27/2022).   Orders:  Orders Placed This Encounter  Procedures   XR Lumbar Spine 2-3 Views   MR Thoracic Spine w/o contrast   No orders of the defined types were placed in this encounter.     Procedures: No procedures performed   Clinical Data: No additional findings.   Subjective: Chief Complaint  Patient presents with   Lower Back - Pain    66 year old female with history of multiple level DDD and fusions T10 to S1 with cages and rods and screws. She is seeing Dr. Ninfa Linden for her left knee and was to undergo viscoelastic supplement injectionof the left knee but apparently missed her appointment. Complains of back pain that is Thoracolumbar to the waist about L3. In the past considered refusion of the L2-3 level for pseudarthrosis howevere more recent studing without sign of persisting motion. No bowel or bladder difficulty. Bladder is no as bad, but she is having persistent pelvis pain.     Review of Systems  Constitutional: Negative.   HENT: Negative.    Eyes: Negative.   Respiratory: Negative.    Cardiovascular: Negative.   Gastrointestinal: Negative.   Endocrine: Negative.   Genitourinary: Negative.   Musculoskeletal: Negative.   Skin: Negative.   Allergic/Immunologic: Negative.   Neurological: Negative.   Hematological: Negative.   Psychiatric/Behavioral: Negative.       Objective: Vital Signs: BP 134/78 (BP Location: Left Arm, Patient Position: Sitting)   Pulse (!) 56   Ht '5\' 3"'$  (1.6 m)   Wt 151 lb (68.5 kg)   BMI 26.75 kg/m   Physical Exam Constitutional:      Appearance: She is well-developed.  HENT:     Head: Normocephalic and atraumatic.  Eyes:     Pupils: Pupils are equal, round, and reactive to light.  Pulmonary:     Effort: Pulmonary effort is normal.     Breath sounds: Normal breath sounds.  Abdominal:     General: Bowel sounds are normal.     Palpations: Abdomen is soft.  Musculoskeletal:     Cervical back: Normal range of motion and neck supple.     Lumbar back: Negative right straight leg raise test and negative left straight leg raise test.  Skin:    General: Skin is warm and dry.  Neurological:     Mental Status: She is alert and  oriented to person, place, and time.  Psychiatric:        Behavior: Behavior normal.        Thought Content: Thought content normal.        Judgment: Judgment normal.    Back Exam   Tenderness  The patient is experiencing tenderness in the lumbar.  Range of Motion  Extension:  abnormal  Flexion:  normal  Lateral bend right:  abnormal  Lateral bend left:  abnormal  Rotation left:  abnormal   Muscle Strength  Right Quadriceps:  5/5  Left Quadriceps:  5/5  Right Hamstrings:  5/5  Left Hamstrings:  5/5   Tests  Straight leg raise right: negative Straight leg raise left: negative  Reflexes  Patellar:  0/4 Achilles:  0/4 Babinski's sign: normal   Other  Toe walk: normal Heel  walk: normal Sensation: normal Erythema: no back redness Scars: present  Comments:  SLR is negative.     Specialty Comments:  No specialty comments available.  Imaging: XR Lumbar Spine 2-3 Views  Result Date: 03/06/2022 AP and lateral flexion and extension radiographs show Thoracolumbar fusion T10 to S1 with L3 to S1 solid fuions With interbody fusions. L2-3 and L1-2 TLIFs with mid sclerosis    PMFS History: Patient Active Problem List   Diagnosis Date Noted   Degenerative disc disease, lumbar 08/01/2015    Priority: High    Class: Chronic   Spondylolisthesis of lumbar region 08/01/2015    Priority: High    Class: Chronic   Unilateral primary osteoarthritis, left knee 01/09/2022   Onychomycosis 10/04/2021   Diarrhea 10/04/2021   Itching 07/16/2021   AKI (acute kidney injury) ([facility]) 02/04/2021   Overdose 02/04/2021   Acute gout 06/20/2020   Interstitial cystitis 01/21/2020   Status post shoulder surgery 09/09/2019   Nausea 08/19/2019   Anxiety 05/04/2019   Insomnia 05/04/2019   Syncope 12/08/2018   Disorder of rotator cuff syndrome of left shoulder and allied disorder 06/22/2018   Bilateral leg edema 06/04/2018   Type 2 diabetes mellitus without complication, without long-term current use of insulin ([facility]) 12/02/2017   Sacral pain 09/17/2017   COPD (chronic obstructive pulmonary disease) ([facility]) 04/21/2017   Lower back pain 03/03/2017   CKD (chronic kidney disease) stage 3, GFR 30-59 ml/min ([facility]) 08/07/2016   GERD (gastroesophageal reflux disease) 08/07/2016   Chronic respiratory failure ([facility]) 07/29/2016   Arthritis of carpometacarpal ([facility]) joint of left thumb 07/09/2016   Pneumothorax on left 01/31/2016   Chronic, continuous use of opioids 09/06/2015   Hypoxia 09/14/2014   Urinary, incontinence, stress female 05/06/2014   Constipation 05/05/2014   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/14/2013   HTN (hypertension) 05/19/2013   HLD (hyperlipidemia) 05/19/2013   Depression 05/19/2013    Hypothyroidism 05/19/2013   Tobacco abuse    Osteoarthritis of both knees 11/18/2011   RLS (restless legs syndrome) 11/18/2011   Past Medical History:  Diagnosis Date   Active smoker    Anxiety    Calcifying tendinitis of shoulder    Chronic back pain    Chronic pain syndrome    COPD (chronic obstructive pulmonary disease) ([facility])    Dysthymic disorder    Emphysema lung ([facility])    GERD (gastroesophageal reflux disease)    Headache    "weekly maybe" (01/31/2016)   Heart murmur    for years, nothing to be concerned about   Herpes genitalia    History of blood transfusion 1980   related to "back surgery"   Hyperlipidemia  Hypertension    Hypothyroidism    Lumbago    Osteoarthrosis, unspecified whether generalized or localized, lower leg    Pain in joint, upper arm    Pneumothorax, left 01/31/2016   S/P Left posterior subcostal pain injection on 01/30/2016   PONV (postoperative nausea and vomiting)    gets nauseous  with longer surgery. Difficuty voiding after surgery   Postlaminectomy syndrome, thoracic region    Primary localized osteoarthrosis, lower leg    Restless leg syndrome    Sleep apnea    s/p surgery- last sleep study 2011- doesnt use oxygen or machine at night as instructed,.   12/2014- Dr Halford Chessman  reports it is negative.   Thyroid disease     Family History  Problem Relation Age of Onset   Kidney disease Mother    Heart disease Father    Anuerysm Brother 47       brain   Heart disease Brother    Heart disease Sister 48       s/p CABG   Hypertension Sister    Colon cancer Neg Hx     Past Surgical History:  Procedure Laterality Date   APPENDECTOMY     BACK SURGERY     18 back surgeries (2 thoracic & 16 lumbar) (01/31/2016)   DILATION AND CURETTAGE OF UTERUS     HAMMER TOE SURGERY     IR RADIOLOGIST EVAL & MGMT  07/21/2017   JOINT REPLACEMENT     KNEE ARTHROSCOPY Right    LAPAROSCOPIC CHOLECYSTECTOMY     LUMBAR FUSION N/A 08/01/2015   Procedure: Right sided  L1-2 and L2-3 transforaminal lumbar interbody fusion with cages, Extension of posterior fusion T12 to L3, Replaced pedicle screws bilaterally L1-L2 , Replaced left sided pedicle screws L-3. Instrumentation T12 to L3 using local bone graft, Vivigen allograft and cancellous chips;  Surgeon: [name], MD;  Location: [facility];  Service: Orthopedics;  Laterality: N/A;   LUMBAR LAMINECTOMY/DECOMPRESSION MICRODISCECTOMY N/A 01/28/2014   Procedure: Minimally Invasive Right  L1-2 Microdiscectomy;  Surgeon: [name], MD;  Location: [facility];  Service: Orthopedics;  Laterality: N/A;   REVERSE SHOULDER ARTHROPLASTY Left 09/09/2019   Procedure: LEFT REVERSE SHOULDER REPLACEMENT;  Surgeon: Meredith Pel, MD;  Location: [facility];  Service: Orthopedics;  Laterality: Left;   TOTAL HIP ARTHROPLASTY Right    TOTAL KNEE ARTHROPLASTY  05/29/2012   Procedure: TOTAL KNEE ARTHROPLASTY;  Surgeon: Mcarthur Rossetti, MD;  Location: [facility];  Service: Orthopedics;  Laterality: Right;  Right Total Knee Arthroplasty   TUBAL LIGATION     UVULOPALATOPHARYNGOPLASTY     VAGINAL HYSTERECTOMY     Social History   Occupational History   Occupation: disability    Comment: back surgeries  Tobacco Use   Smoking status: Every Day    Packs/day: 1.25    Years: 45.00    Total pack years: 56.25    Types: Cigarettes   Smokeless tobacco: Never   Tobacco comments:    form given 12/25/16  Vaping Use   Vaping Use: Never used  Substance and Sexual Activity   Alcohol use: No    Alcohol/week: 0.0 standard drinks of alcohol   Drug use: No    Types: Marijuana    Comment: 01/31/2016 "none since ~ 1980"   Sexual activity: Never    Partners: Male

## 2022-03-06 NOTE — Patient Instructions (Addendum)
Avoid frequent bending and stooping  No lifting greater than 10 lbs. May use ice or moist heat for pain. Weight loss is of benefit. Best medication for lumbar disc disease is arthritis medications that you should not take due to affect on the kidneys. Presently your kidney function is good. Exercise is important to improve your indurance and does allow people to function better inspite of back pain. Yoga and stretching exercises are helpful for patients with long fusions

## 2022-03-12 DIAGNOSIS — Z79899 Other long term (current) drug therapy: Secondary | ICD-10-CM | POA: Diagnosis not present

## 2022-03-12 DIAGNOSIS — M545 Low back pain, unspecified: Secondary | ICD-10-CM | POA: Diagnosis not present

## 2022-03-12 DIAGNOSIS — M546 Pain in thoracic spine: Secondary | ICD-10-CM | POA: Diagnosis not present

## 2022-03-12 DIAGNOSIS — J439 Emphysema, unspecified: Secondary | ICD-10-CM | POA: Diagnosis not present

## 2022-03-12 DIAGNOSIS — J449 Chronic obstructive pulmonary disease, unspecified: Secondary | ICD-10-CM | POA: Diagnosis not present

## 2022-03-12 DIAGNOSIS — M542 Cervicalgia: Secondary | ICD-10-CM | POA: Diagnosis not present

## 2022-03-12 DIAGNOSIS — R030 Elevated blood-pressure reading, without diagnosis of hypertension: Secondary | ICD-10-CM | POA: Diagnosis not present

## 2022-03-13 ENCOUNTER — Telehealth: Payer: Self-pay | Admitting: Specialist

## 2022-03-13 NOTE — Telephone Encounter (Signed)
Patient called in stating she would like a letter stating that Her daughter Army Fossa is her major care taker and lives with her to assist with her activities of daily living. This is expected to continue for the next several years. She would like that mailed to her home address, also she is interested in getting a scooter so she can get around better and would like a Rx to be written for it by [place], she states she is have a very hard time moving around and would like to be able to get out and get fresh air and move more easily throughout the home

## 2022-03-14 ENCOUNTER — Telehealth: Payer: Self-pay | Admitting: Internal Medicine

## 2022-03-14 ENCOUNTER — Encounter: Payer: Self-pay | Admitting: Radiology

## 2022-03-14 NOTE — Telephone Encounter (Signed)
Pt called and stated she needs a higher dosage patch to stop smoking. She said she is currently using [place] nicotine patch from her local pharmacy and she stated it isn't helping.  Pt also stated she would like to know if the dosage can be increased on QUEtiapine (SEROQUEL) 100 MG tablet. Pt would like to know if she can try Ambien instead.    Pt is requesting a refill on promethazine (PHENERGAN) 50 MG tablet.  Pharmacy:  [facility], [place] Phone:  [phone]  Fax:  [phone]      LOV: 12/20/21  ROV:07/16/22   Please advise CB: [phone]

## 2022-03-14 NOTE — Telephone Encounter (Signed)
I printed and mailed letter to patient,  will have to ask Dr. [name] about the scooter when he returns

## 2022-03-18 DIAGNOSIS — Z79899 Other long term (current) drug therapy: Secondary | ICD-10-CM | POA: Diagnosis not present

## 2022-03-22 ENCOUNTER — Other Ambulatory Visit: Payer: Self-pay | Admitting: Internal Medicine

## 2022-03-22 MED ORDER — CLONAZEPAM 1 MG PO TABS
1.0000 mg | ORAL_TABLET | Freq: Every day | ORAL | 0 refills | Status: DC
Start: 2022-03-22 — End: 2022-04-21

## 2022-03-22 NOTE — Telephone Encounter (Signed)
Check  registry last filled 02/21/2022.Marland KitchenJohny Phillips

## 2022-03-25 DIAGNOSIS — J449 Chronic obstructive pulmonary disease, unspecified: Secondary | ICD-10-CM | POA: Diagnosis not present

## 2022-03-25 DIAGNOSIS — R0602 Shortness of breath: Secondary | ICD-10-CM | POA: Diagnosis not present

## 2022-03-26 ENCOUNTER — Telehealth: Payer: Self-pay | Admitting: Internal Medicine

## 2022-03-26 ENCOUNTER — Ambulatory Visit
Admission: RE | Admit: 2022-03-26 | Discharge: 2022-03-26 | Disposition: A | Discharge status: Home / Self Care | Payer: Medicare Other | Source: Ambulatory Visit | Attending: Specialist | Admitting: Specialist

## 2022-03-26 DIAGNOSIS — M4724 Other spondylosis with radiculopathy, thoracic region: Secondary | ICD-10-CM

## 2022-03-26 DIAGNOSIS — M546 Pain in thoracic spine: Secondary | ICD-10-CM | POA: Diagnosis not present

## 2022-03-26 DIAGNOSIS — M545 Low back pain, unspecified: Secondary | ICD-10-CM

## 2022-03-26 MED ORDER — QUETIAPINE FUMARATE 200 MG PO TABS: 200.0000 mg | ORAL_TABLET | Freq: Every day | ORAL | 3 refills | Status: DC

## 2022-03-26 NOTE — Telephone Encounter (Signed)
We can try 200 mg at night.  She cannot take any Benadryl with this.  If she has any side effects she needs to let me know

## 2022-03-26 NOTE — Telephone Encounter (Signed)
Patient would like to have her serequel increased from 1 1/2 to 2 mg.  - she is taking 4 benadryl nightly so that she can sleep.

## 2022-03-27 NOTE — Telephone Encounter (Signed)
Message left for patient with Dr. Quay Burow recommendations.

## 2022-04-02 MED ORDER — NICOTINE 21 MG/24HR TD PT24: 21.0000 mg | MEDICATED_PATCH | Freq: Every day | TRANSDERMAL | 0 refills | Status: DC

## 2022-04-02 NOTE — Telephone Encounter (Signed)
Nicotine patches sent to [facility]

## 2022-04-02 NOTE — Telephone Encounter (Signed)
Patient is still requesting her nicotine patches be sent in to [facility]

## 2022-04-03 ENCOUNTER — Other Ambulatory Visit: Payer: Self-pay | Admitting: Internal Medicine

## 2022-04-04 ENCOUNTER — Encounter: Payer: Self-pay | Admitting: Specialist

## 2022-04-04 ENCOUNTER — Ambulatory Visit (INDEPENDENT_AMBULATORY_CARE_PROVIDER_SITE_OTHER): Payer: Medicare Other | Admitting: Specialist

## 2022-04-04 VITALS — BP 109/68 | HR 64

## 2022-04-04 DIAGNOSIS — M4724 Other spondylosis with radiculopathy, thoracic region: Secondary | ICD-10-CM | POA: Diagnosis not present

## 2022-04-04 DIAGNOSIS — M4155 Other secondary scoliosis, thoracolumbar region: Secondary | ICD-10-CM | POA: Diagnosis not present

## 2022-04-04 DIAGNOSIS — M4325 Fusion of spine, thoracolumbar region: Secondary | ICD-10-CM | POA: Diagnosis not present

## 2022-04-04 NOTE — Progress Notes (Signed)
Office Visit Note   Patient: Jill Phillips           Date of Birth: [birth year]           MRN: [id number] Visit Date: 04/04/2022              Requested by: [name], MD [place],  [place] [address] PCP: [name], MD   Assessment & Plan: Visit Diagnoses:  1. Thoracic spondylosis with radiculopathy   2. Other secondary scoliosis, thoracolumbar region   3. Fusion of spine of thoracolumbar region     Plan: CLINICAL DATA:  Mid back pain for 6 years.  No known injury.   EXAM: MRI THORACIC SPINE WITHOUT CONTRAST   TECHNIQUE: Multiplanar, multisequence MR imaging of the thoracic spine was performed. No intravenous contrast was administered.   COMPARISON:  MRI lumbar spine 08/11/2021, CT chest 06/23/2021   FINDINGS: Alignment:  Physiologic.   Vertebrae: No acute fracture, evidence of discitis, or aggressive bone lesion.   Cord:  Normal signal and morphology.   Paraspinal and other soft tissues: No acute paraspinal abnormality.   Disc levels:   Disc spaces: Posterior spinal fusion from T11 through visualized portion of L2. Degenerative disease with disc height loss at T9-10 and T10-11 with bone marrow edema throughout the T9 and T10 vertebral bodies likely reactive.   T1-T2: No disc protrusion, foraminal stenosis or central canal stenosis.   T2-T3: No disc protrusion, foraminal stenosis or central canal stenosis.   T3-T4: No disc protrusion, foraminal stenosis or central canal stenosis.   T4-T5: Minimal broad-based disc bulge. No foraminal or central canal stenosis.   T5-T6: No disc protrusion, foraminal stenosis or central canal stenosis.   T6-T7: No disc protrusion, foraminal stenosis or central canal stenosis.   T7-T8: Minimal broad-based disc bulge with a tiny central disc protrusion. No foraminal or central canal stenosis.   T8-T9: Mild broad-based disc bulge. No right foraminal stenosis. Moderate left foraminal stenosis. No  spinal stenosis.   T9-T10: Mild broad-based disc bulge. Mild spinal stenosis. Moderate right foraminal stenosis. Moderate-severe left foraminal stenosis.   T10-T11: No disc protrusion, foraminal stenosis or central canal stenosis.   T11-T12: Interbody fusion.  No foraminal or central canal stenosis.   IMPRESSION: 1.  No acute osseous injury of the thoracic spine. 2. At T9-10 there is a mild broad-based disc bulge. Mild spinal stenosis. Moderate right foraminal stenosis. Moderate-severe left foraminal stenosis. 3. At T8-9 there is a mild broad-based disc bulge. No right foraminal stenosis. Moderate left foraminal stenosis. 4. Posterior spinal fusion from T11 through visualized portion of L2. 5. Degenerative disease with disc height loss at T9-10 and T10-11 with reactive bone marrow edema throughout the T9 and T10 vertebral bodies.     Electronically Signed   By: Kathreen Devoid M.D.   On: 03/27/2022 07:46    Follow-Up Instructions: No follow-ups on file.   Orders:  No orders of the defined types were placed in this encounter.  No orders of the defined types were placed in this encounter.     Procedures: No procedures performed   Clinical Data: No additional findings.   Subjective: Chief Complaint  Patient presents with   Lower Back - Follow-up    66 year old female with history of long fusion T11 to S1. She has had pelvic and rectal pain since previous surgery She has both sacral pain and upper lumbar lower thoracic pains. Sitting and lying is painful. Underwent  recent MRI of the thoracic spine and the study is available for assessment. No bowel or bladder incontinence has IC of the bladder.     Review of Systems  Constitutional: Negative.   HENT: Negative.    Eyes: Negative.   Respiratory: Negative.    Cardiovascular: Negative.   Gastrointestinal: Negative.   Endocrine: Negative.   Genitourinary: Negative.   Musculoskeletal: Negative.   Skin: Negative.    Allergic/Immunologic: Negative.   Neurological: Negative.   Hematological: Negative.   Psychiatric/Behavioral: Negative.       Objective: Vital Signs: BP 109/68   Pulse 64   Physical Exam Constitutional:      Appearance: She is well-developed.  HENT:     Head: Normocephalic and atraumatic.  Eyes:     Pupils: Pupils are equal, round, and reactive to light.  Pulmonary:     Effort: Pulmonary effort is normal.     Breath sounds: Normal breath sounds.  Abdominal:     General: Bowel sounds are normal.     Palpations: Abdomen is soft.  Musculoskeletal:        General: Normal range of motion.     Cervical back: Normal range of motion and neck supple.  Skin:    General: Skin is warm and dry.  Neurological:     Mental Status: She is alert and oriented to person, place, and time.  Psychiatric:        Behavior: Behavior normal.        Thought Content: Thought content normal.        Judgment: Judgment normal.     Ortho Exam  Specialty Comments:  No specialty comments available.  Imaging: No results found.   PMFS History: Patient Active Problem List   Diagnosis Date Noted   Degenerative disc disease, lumbar 08/01/2015    Priority: High    Class: Chronic   Spondylolisthesis of lumbar region 08/01/2015    Priority: High    Class: Chronic   Unilateral primary osteoarthritis, left knee 01/09/2022   Onychomycosis 10/04/2021   Diarrhea 10/04/2021   Itching 07/16/2021   AKI (acute kidney injury) ([facility]) 02/04/2021   Overdose 02/04/2021   Acute gout 06/20/2020   Interstitial cystitis 01/21/2020   Status post shoulder surgery 09/09/2019   Nausea 08/19/2019   Anxiety 05/04/2019   Insomnia 05/04/2019   Syncope 12/08/2018   Disorder of rotator cuff syndrome of left shoulder and allied disorder 06/22/2018   Bilateral leg edema 06/04/2018   Type 2 diabetes mellitus without complication, without long-term current use of insulin ([facility]) 12/02/2017   Sacral pain 09/17/2017    COPD (chronic obstructive pulmonary disease) ([facility]) 04/21/2017   Lower back pain 03/03/2017   CKD (chronic kidney disease) stage 3, GFR 30-59 ml/min ([facility]) 08/07/2016   GERD (gastroesophageal reflux disease) 08/07/2016   Chronic respiratory failure ([facility]) 07/29/2016   Arthritis of carpometacarpal ([facility]) joint of left thumb 07/09/2016   Pneumothorax on left 01/31/2016   Chronic, continuous use of opioids 09/06/2015   Hypoxia 09/14/2014   Urinary, incontinence, stress female 05/06/2014   Constipation 05/05/2014   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/14/2013   HTN (hypertension) 05/19/2013   HLD (hyperlipidemia) 05/19/2013   Depression 05/19/2013   Hypothyroidism 05/19/2013   Tobacco abuse    Osteoarthritis of both knees 11/18/2011   RLS (restless legs syndrome) 11/18/2011   Past Medical History:  Diagnosis Date   Active smoker    Anxiety    Calcifying tendinitis of shoulder    Chronic back pain  Chronic pain syndrome    COPD (chronic obstructive pulmonary disease) ([facility])    Dysthymic disorder    Emphysema lung ([facility])    GERD (gastroesophageal reflux disease)    Headache    "weekly maybe" (01/31/2016)   Heart murmur    for years, nothing to be concerned about   Herpes genitalia    History of blood transfusion 1980   related to "back surgery"   Hyperlipidemia    Hypertension    Hypothyroidism    Lumbago    Osteoarthrosis, unspecified whether generalized or localized, lower leg    Pain in joint, upper arm    Pneumothorax, left 01/31/2016   S/P Left posterior subcostal pain injection on 01/30/2016   PONV (postoperative nausea and vomiting)    gets nauseous  with longer surgery. Difficuty voiding after surgery   Postlaminectomy syndrome, thoracic region    Primary localized osteoarthrosis, lower leg    Restless leg syndrome    Sleep apnea    s/p surgery- last sleep study 2011- doesnt use oxygen or machine at night as instructed,.   12/2014- Dr Halford Chessman  reports it is negative.   Thyroid disease      Family History  Problem Relation Age of Onset   Kidney disease Mother    Heart disease Father    Anuerysm Brother 60       brain   Heart disease Brother    Heart disease Sister 75       s/p CABG   Hypertension Sister    Colon cancer Neg Hx     Past Surgical History:  Procedure Laterality Date   APPENDECTOMY     BACK SURGERY     18 back surgeries (2 thoracic & 16 lumbar) (01/31/2016)   DILATION AND CURETTAGE OF UTERUS     HAMMER TOE SURGERY     IR RADIOLOGIST EVAL & MGMT  07/21/2017   JOINT REPLACEMENT     KNEE ARTHROSCOPY Right    LAPAROSCOPIC CHOLECYSTECTOMY     LUMBAR FUSION N/A 08/01/2015   Procedure: Right sided L1-2 and L2-3 transforaminal lumbar interbody fusion with cages, Extension of posterior fusion T12 to L3, Replaced pedicle screws bilaterally L1-L2 , Replaced left sided pedicle screws L-3. Instrumentation T12 to L3 using local bone graft, Vivigen allograft and cancellous chips;  Surgeon: [name], MD;  Location: [facility];  Service: Orthopedics;  Laterality: N/A;   LUMBAR LAMINECTOMY/DECOMPRESSION MICRODISCECTOMY N/A 01/28/2014   Procedure: Minimally Invasive Right  L1-2 Microdiscectomy;  Surgeon: [name], MD;  Location: [facility];  Service: Orthopedics;  Laterality: N/A;   REVERSE SHOULDER ARTHROPLASTY Left 09/09/2019   Procedure: LEFT REVERSE SHOULDER REPLACEMENT;  Surgeon: Meredith Pel, MD;  Location: [facility];  Service: Orthopedics;  Laterality: Left;   TOTAL HIP ARTHROPLASTY Right    TOTAL KNEE ARTHROPLASTY  05/29/2012   Procedure: TOTAL KNEE ARTHROPLASTY;  Surgeon: Mcarthur Rossetti, MD;  Location[facility];  Service: Orthopedics;  Laterality: Right;  Right Total Knee Arthroplasty   TUBAL LIGATION     UVULOPALATOPHARYNGOPLASTY     VAGINAL HYSTERECTOMY     Social History   Occupational History   Occupation: disability    Comment: back surgeries  Tobacco Use   Smoking status: Every Day    Packs/day: 1.25    Years: 45.00    Total pack years: 56.25     Types: Cigarettes   Smokeless tobacco: Never   Tobacco comments:    form given 12/25/16  Vaping Use  Vaping Use: Never used  Substance and Sexual Activity   Alcohol use: No    Alcohol/week: 0.0 standard drinks of alcohol   Drug use: No    Types: Marijuana    Comment: 01/31/2016 "none since ~ 1980"   Sexual activity: Never    Partners: Male

## 2022-04-05 ENCOUNTER — Other Ambulatory Visit: Payer: Self-pay | Admitting: Internal Medicine

## 2022-04-09 DIAGNOSIS — Z79899 Other long term (current) drug therapy: Secondary | ICD-10-CM | POA: Diagnosis not present

## 2022-04-09 DIAGNOSIS — G2581 Restless legs syndrome: Secondary | ICD-10-CM | POA: Diagnosis not present

## 2022-04-09 DIAGNOSIS — M545 Low back pain, unspecified: Secondary | ICD-10-CM | POA: Diagnosis not present

## 2022-04-09 DIAGNOSIS — H00015 Hordeolum externum left lower eyelid: Secondary | ICD-10-CM | POA: Diagnosis not present

## 2022-04-12 ENCOUNTER — Other Ambulatory Visit: Payer: Self-pay | Admitting: Internal Medicine

## 2022-04-15 ENCOUNTER — Other Ambulatory Visit: Payer: Self-pay | Admitting: Internal Medicine

## 2022-04-15 DIAGNOSIS — Z79899 Other long term (current) drug therapy: Secondary | ICD-10-CM | POA: Diagnosis not present

## 2022-04-20 ENCOUNTER — Other Ambulatory Visit: Payer: Self-pay | Admitting: Internal Medicine

## 2022-04-25 DIAGNOSIS — J449 Chronic obstructive pulmonary disease, unspecified: Secondary | ICD-10-CM | POA: Diagnosis not present

## 2022-04-25 DIAGNOSIS — R0602 Shortness of breath: Secondary | ICD-10-CM | POA: Diagnosis not present

## 2022-04-26 ENCOUNTER — Other Ambulatory Visit: Payer: Self-pay | Admitting: Internal Medicine

## 2022-05-02 ENCOUNTER — Ambulatory Visit (INDEPENDENT_AMBULATORY_CARE_PROVIDER_SITE_OTHER): Payer: Medicare Other | Admitting: Neurology

## 2022-05-02 DIAGNOSIS — M5417 Radiculopathy, lumbosacral region: Secondary | ICD-10-CM

## 2022-05-02 DIAGNOSIS — R202 Paresthesia of skin: Secondary | ICD-10-CM

## 2022-05-02 NOTE — Procedures (Signed)
[facility]  [place], [place]  [place], [place] [address] Tel: [phone] Fax:  [phone] Test Date:  05/02/2022  Patient: Jill Phillips DOB: Apr 13, 1956 Physician: Narda Amber, DO  Sex: Female Height: '5\' 3"'$  Ref Phys: Basil Dess, MD  ID#: [id number]   Technician:    Patient Complaints: This is a 66 year-old female with history of multiple lumbar surgeries referred for evaluation of bilateral feet numbness, worse on the right.   NCV & EMG Findings: Extensive electrodiagnostic testing of the right lower extremity and additional studies of the left shows: Bilateral sural and superficial peroneal sensory responses are within normal limits. Bilateral peroneal motor responses at the extensor digitorum brevis are absent.  Peroneal motor response at the right tibialis anterior is reduced (2.5 ms), and normal on the left.  Bilateral tibial motor responses are within normal limits. Tibial H reflex is prolonged on the right, and normal on the left.   Chronic motor axonal loss changes are seen diffusely in the right lower extremity affecting the L2-S1 myotomes.  These findings are limited to the L3-L4 myotome on the left.  Impression: Chronic right L5-S1 radiculopathy, moderate. Chronic bilateral L3-4 radiculopathy, mild. There is no evidence of a large fiber sensorimotor polyneuropathy or tarsal tunnel syndrome affecting the lower extremities.   ___________________________ Narda Amber, DO    Nerve Conduction Studies Anti Sensory Summary Table   Stim Site NR Peak (ms) Norm Peak (ms) O-P Amp (V) Norm O-P Amp  Left Sup Peroneal Anti Sensory (Ant Lat Mall)  32C  12 cm    2.4 <4.6 5.2 >3  Right Sup Peroneal Anti Sensory (Ant Lat Mall)  32C  12 cm    2.6 <4.6 4.9 >3  Left Sural Anti Sensory (Lat Mall)  32C  Calf    3.6 <4.6 7.1 >3  Right Sural Anti Sensory (Lat Mall)  32C  Calf    3.7 <4.6 6.1 >3   Motor Summary Table   Stim Site NR Onset (ms) Norm Onset  (ms) O-P Amp (mV) Norm O-P Amp Site1 Site2 Delta-0 (ms) Dist (cm) Vel (m/s) Norm Vel (m/s)  Left Peroneal Motor (Ext Dig Brev)  32C  Ankle NR  <6.0  >2.5 B Fib Ankle  0.0  >40  B Fib NR     Poplt B Fib  0.0  >40  Poplt NR            Right Peroneal Motor (Ext Dig Brev)  32C  Ankle NR  <6.0  >2.5 B Fib Ankle  0.0  >40  B Fib NR     Poplt B Fib  0.0  >40  Poplt NR            Left Peroneal TA Motor (Tib Ant)  32C  Fib Head    3.2 <4.5 5.4 >3 Poplit Fib Head 1.3 7.0 54 >40  Poplit    4.5  5.2         Right Peroneal TA Motor (Tib Ant)  32C  Fib Head    3.7 <4.5 2.5 >3 Poplit Fib Head 1.2 7.0 58 >40  Poplit    4.9  2.2         Left Tibial Motor (Abd Hall Brev)  32C  Ankle    3.3 <6.0 7.3 >4 Knee Ankle 8.0 39.0 49 >40  Knee    11.3  4.5         Right Tibial Motor (Abd Hall Brev)  32C  Ankle  4.9 <6.0 8.3 >4 Knee Ankle 9.8 42.0 43 >40  Knee    14.7  5.2          H Reflex Studies   NR H-Lat (ms) Lat Norm (ms) L-R H-Lat (ms)  Left Tibial (Gastroc)  32C     34.15 <35 2.59  Right Tibial (Gastroc)  32C     36.73 <35 2.59   EMG   Side Muscle Ins Act Fibs Fasc Recrt Dur. Amp. Poly. Activation Comment  Right AntTibialis Nml Nml Nml 2- 1+ 1+ 1+ Nml N/A  Right Gastroc Nml Nml Nml 1- 1+ 1+ 1+ Nml N/A  Right Flex Dig Long Nml Nml Nml 2- 1+ 1+ 1+ Nml N/A  Right RectFemoris Nml Nml Nml 1- 1+ 1+ 1+ Nml N/A  Right GluteusMed Nml Nml Nml 1- 1+ 1+ 1+ Nml N/A  Right AdductorLong Nml Nml Nml 1- 1+ 1+ 1+ Nml N/A  Left Gastroc Nml Nml Nml Nml Nml Nml Nml Nml N/A  Left Flex Dig Long Nml Nml Nml Nml Nml Nml Nml Nml N/A  Left RectFemoris Nml Nml Nml 1- 1+ 1+ 1+ Nml N/A  Left GluteusMed Nml Nml Nml Nml Nml Nml Nml Nml N/A  Left AntTibialis Nml Nml Nml Nml Nml Nml Nml Nml N/A      Waveforms:

## 2022-05-08 ENCOUNTER — Encounter: Payer: Self-pay | Admitting: Specialist

## 2022-05-08 ENCOUNTER — Ambulatory Visit (INDEPENDENT_AMBULATORY_CARE_PROVIDER_SITE_OTHER): Payer: Medicare Other | Admitting: Specialist

## 2022-05-08 VITALS — BP 142/80 | HR 65 | Ht 63.0 in | Wt 151.0 lb

## 2022-05-08 DIAGNOSIS — M5416 Radiculopathy, lumbar region: Secondary | ICD-10-CM | POA: Diagnosis not present

## 2022-05-08 DIAGNOSIS — M4724 Other spondylosis with radiculopathy, thoracic region: Secondary | ICD-10-CM | POA: Diagnosis not present

## 2022-05-08 DIAGNOSIS — M4155 Other secondary scoliosis, thoracolumbar region: Secondary | ICD-10-CM

## 2022-05-08 DIAGNOSIS — M4325 Fusion of spine, thoracolumbar region: Secondary | ICD-10-CM

## 2022-05-08 NOTE — Patient Instructions (Signed)
Avoid bending, stooping and avoid lifting weights greater than 10 lbs. Avoid prolong standing and walking. Avoid frequent bending and stooping  No lifting greater than 10 lbs. May use ice or moist heat for pain. Weight loss is of benefit. Handicap license is approved. Avoid overhead lifting and overhead use of the arms. Do not lift greater than 5 lbs. Adjust head rest in vehicle to prevent hyperextension if rear ended. Take extra precautions to avoid falling, including use of a cane if you feel weak. Appt with Dr. Ninfa Linden for monovisc or synvisc injection.

## 2022-05-08 NOTE — Progress Notes (Signed)
Office Visit Note   Patient: Jill Phillips           Date of Birth: Apr 09, 1956           MRN: [id number] Visit Date: 05/08/2022              Requested by: [name], MD [place],  [place] [address] PCP: [name], MD   Assessment & Plan: Visit Diagnoses:  1. Thoracic spondylosis with radiculopathy   2. Other secondary scoliosis, thoracolumbar region   3. Fusion of spine of thoracolumbar region   4. Lumbar radiculopathy, chronic     Plan: Avoid bending, stooping and avoid lifting weights greater than 10 lbs. Avoid prolong standing and walking. Avoid frequent bending and stooping  No lifting greater than 10 lbs. May use ice or moist heat for pain. Weight loss is of benefit. Handicap license is approved. Avoid overhead lifting and overhead use of the arms. Do not lift greater than 5 lbs. Adjust head rest in vehicle to prevent hyperextension if rear ended. Take extra precautions to avoid falling, including use of a cane if you feel weak. Appt with Dr. Ninfa Linden for monovisc or synvisc injection.   Follow-Up Instructions: Return in about 3 weeks (around 05/29/2022) for return to Dr. Ninfa Linden for Viscoelastic supplement injection already ordered. .   Orders:  No orders of the defined types were placed in this encounter.  No orders of the defined types were placed in this encounter.     Procedures: No procedures performed   Clinical Data: No additional findings.   Subjective: Chief Complaint  Patient presents with   Lower Back - Follow-up    66 year old female with pelvic and rectal pain post Thoracolumbar fusion. Underwent recent EMG/NCV with    Review of Systems  Constitutional: Negative.   HENT: Negative.    Eyes: Negative.   Respiratory: Negative.    Cardiovascular: Negative.   Gastrointestinal: Negative.   Endocrine: Negative.   Genitourinary: Negative.   Musculoskeletal: Negative.   Skin: Negative.   Allergic/Immunologic:  Negative.   Neurological: Negative.   Hematological: Negative.   Psychiatric/Behavioral: Negative.       Objective: Vital Signs: BP (!) 142/80 (BP Location: Left Arm, Patient Position: Sitting)   Pulse 65   Ht '5\' 3"'$  (1.6 m)   Wt 151 lb (68.5 kg)   BMI 26.75 kg/m   Physical Exam Constitutional:      Appearance: She is well-developed.  HENT:     Head: Normocephalic and atraumatic.  Eyes:     Pupils: Pupils are equal, round, and reactive to light.  Pulmonary:     Effort: Pulmonary effort is normal.     Breath sounds: Normal breath sounds.  Abdominal:     General: Bowel sounds are normal.     Palpations: Abdomen is soft.  Musculoskeletal:     Cervical back: Normal range of motion and neck supple.     Lumbar back: Negative right straight leg raise test and negative left straight leg raise test.  Skin:    General: Skin is warm and dry.  Neurological:     Mental Status: She is alert and oriented to person, place, and time.  Psychiatric:        Behavior: Behavior normal.        Thought Content: Thought content normal.        Judgment: Judgment normal.     Back Exam   Tenderness  The patient  is experiencing tenderness in the lumbar.  Range of Motion  Extension:  abnormal  Flexion:  abnormal  Lateral bend right:  abnormal  Lateral bend left:  abnormal  Rotation right:  abnormal  Rotation left:  abnormal   Muscle Strength  Right Quadriceps:  5/5  Left Quadriceps:  5/5  Right Hamstrings:  5/5  Left Hamstrings:  5/5   Tests  Straight leg raise right: negative Straight leg raise left: negative  Reflexes  Achilles:  0/4 Biceps:  0/4  Other  Toe walk: normal Heel walk: normal Erythema: no back redness Scars: present      Specialty Comments:  No specialty comments available.  Imaging: No results found.   PMFS History: Patient Active Problem List   Diagnosis Date Noted   Degenerative disc disease, lumbar 08/01/2015    Priority: High    Class:  Chronic   Spondylolisthesis of lumbar region 08/01/2015    Priority: High    Class: Chronic   Unilateral primary osteoarthritis, left knee 01/09/2022   Onychomycosis 10/04/2021   Diarrhea 10/04/2021   Itching 07/16/2021   AKI (acute kidney injury) ([facility]) 02/04/2021   Overdose 02/04/2021   Acute gout 06/20/2020   Interstitial cystitis 01/21/2020   Status post shoulder surgery 09/09/2019   Nausea 08/19/2019   Anxiety 05/04/2019   Insomnia 05/04/2019   Syncope 12/08/2018   Disorder of rotator cuff syndrome of left shoulder and allied disorder 06/22/2018   Bilateral leg edema 06/04/2018   Type 2 diabetes mellitus without complication, without long-term current use of insulin ([place]) 12/02/2017   Sacral pain 09/17/2017   COPD (chronic obstructive pulmonary disease) ([facility]) 04/21/2017   Lower back pain 03/03/2017   CKD (chronic kidney disease) stage 3, GFR 30-59 ml/min (HCC) 08/07/2016   GERD (gastroesophageal reflux disease) 08/07/2016   Chronic respiratory failure ([facility]) 07/29/2016   Arthritis of carpometacarpal (CMC) joint of left thumb 07/09/2016   Pneumothorax on left 01/31/2016   Chronic, continuous use of opioids 09/06/2015   Hypoxia 09/14/2014   Urinary, incontinence, stress female 05/06/2014   Constipation 05/05/2014   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/14/2013   HTN (hypertension) 05/19/2013   HLD (hyperlipidemia) 05/19/2013   Depression 05/19/2013   Hypothyroidism 05/19/2013   Tobacco abuse    Osteoarthritis of both knees 11/18/2011   RLS (restless legs syndrome) 11/18/2011   Past Medical History:  Diagnosis Date   Active smoker    Anxiety    Calcifying tendinitis of shoulder    Chronic back pain    Chronic pain syndrome    COPD (chronic obstructive pulmonary disease) (HCC)    Dysthymic disorder    Emphysema lung (HCC)    GERD (gastroesophageal reflux disease)    Headache    "weekly maybe" (01/31/2016)   Heart murmur    for years, nothing to be concerned about   Herpes genitalia     History of blood transfusion 1980   related to "back surgery"   Hyperlipidemia    Hypertension    Hypothyroidism    Lumbago    Osteoarthrosis, unspecified whether generalized or localized, lower leg    Pain in joint, upper arm    Pneumothorax, left 01/31/2016   S/P Left posterior subcostal pain injection on 01/30/2016   PONV (postoperative nausea and vomiting)    gets nauseous  with longer surgery. Difficuty voiding after surgery   Postlaminectomy syndrome, thoracic region    Primary localized osteoarthrosis, lower leg    Restless leg syndrome    Sleep apnea  s/p surgery- last sleep study 2011- doesnt use oxygen or machine at night as instructed,.   12/2014- Dr Halford Chessman  reports it is negative.   Thyroid disease     Family History  Problem Relation Age of Onset   Kidney disease Mother    Heart disease Father    Anuerysm Brother 60       brain   Heart disease Brother    Heart disease Sister 71       s/p CABG   Hypertension Sister    Colon cancer Neg Hx     Past Surgical History:  Procedure Laterality Date   APPENDECTOMY     BACK SURGERY     18 back surgeries (2 thoracic & 16 lumbar) (01/31/2016)   DILATION AND CURETTAGE OF UTERUS     HAMMER TOE SURGERY     IR RADIOLOGIST EVAL & MGMT  07/21/2017   JOINT REPLACEMENT     KNEE ARTHROSCOPY Right    LAPAROSCOPIC CHOLECYSTECTOMY     LUMBAR FUSION N/A 08/01/2015   Procedure: Right sided L1-2 and L2-3 transforaminal lumbar interbody fusion with cages, Extension of posterior fusion T12 to L3, Replaced pedicle screws bilaterally L1-L2 , Replaced left sided pedicle screws L-3. Instrumentation T12 to L3 using local bone graft, Vivigen allograft and cancellous chips;  Surgeon: [name], MD;  Location: [facility];  Service: Orthopedics;  Laterality: N/A;   LUMBAR LAMINECTOMY/DECOMPRESSION MICRODISCECTOMY N/A 01/28/2014   Procedure: Minimally Invasive Right  L1-2 Microdiscectomy;  Surgeon: [name], MD;  Location: [facility];  Service:  Orthopedics;  Laterality: N/A;   REVERSE SHOULDER ARTHROPLASTY Left 09/09/2019   Procedure: LEFT REVERSE SHOULDER REPLACEMENT;  Surgeon: Meredith Pel, MD;  Location: [facility];  Service: Orthopedics;  Laterality: Left;   TOTAL HIP ARTHROPLASTY Right    TOTAL KNEE ARTHROPLASTY  05/29/2012   Procedure: TOTAL KNEE ARTHROPLASTY;  Surgeon: Mcarthur Rossetti, MD;  Location: [facility];  Service: Orthopedics;  Laterality: Right;  Right Total Knee Arthroplasty   TUBAL LIGATION     UVULOPALATOPHARYNGOPLASTY     VAGINAL HYSTERECTOMY     Social History   Occupational History   Occupation: disability    Comment: back surgeries  Tobacco Use   Smoking status: Every Day    Packs/day: 1.25    Years: 45.00    Total pack years: 56.25    Types: Cigarettes   Smokeless tobacco: Never   Tobacco comments:    form given 12/25/16  Vaping Use   Vaping Use: Never used  Substance and Sexual Activity   Alcohol use: No    Alcohol/week: 0.0 standard drinks of alcohol   Drug use: No    Types: Marijuana    Comment: 01/31/2016 "none since ~ 1980"   Sexual activity: Never    Partners: Male

## 2022-05-10 ENCOUNTER — Other Ambulatory Visit: Payer: Self-pay | Admitting: Specialist

## 2022-05-10 DIAGNOSIS — G8929 Other chronic pain: Secondary | ICD-10-CM

## 2022-05-17 DIAGNOSIS — M545 Low back pain, unspecified: Secondary | ICD-10-CM | POA: Diagnosis not present

## 2022-05-17 DIAGNOSIS — Z79899 Other long term (current) drug therapy: Secondary | ICD-10-CM | POA: Diagnosis not present

## 2022-05-20 ENCOUNTER — Other Ambulatory Visit: Payer: Self-pay | Admitting: Internal Medicine

## 2022-05-21 DIAGNOSIS — Z79899 Other long term (current) drug therapy: Secondary | ICD-10-CM | POA: Diagnosis not present

## 2022-05-24 NOTE — Telephone Encounter (Signed)
error 

## 2022-05-26 DIAGNOSIS — J449 Chronic obstructive pulmonary disease, unspecified: Secondary | ICD-10-CM | POA: Diagnosis not present

## 2022-05-26 DIAGNOSIS — R0602 Shortness of breath: Secondary | ICD-10-CM | POA: Diagnosis not present

## 2022-05-27 ENCOUNTER — Telehealth: Payer: Self-pay | Admitting: Internal Medicine

## 2022-05-27 NOTE — Telephone Encounter (Signed)
PT calls today in regards to recent return of symptoms. PT had seen [place] in February regarding a couple of different issues, one being infection in toes/toenails. PT had been prescribed ciclopirox %8 topical cream for this and was wanting to know if there was anyway she could get another RX in of this.  PT stated that her toes are not to where they are currently yellow, however the top of the toenail is showing white (as though she had scraped it, but she hadn't). She was also thinking that something else was prescribed but I can't seem to find it on my end.  CB: [phone]  I also informed PT that she might need an appointment before an RX could be done, but PT wanted to check with Dr.Burns first since she was aware of the issue.

## 2022-05-28 MED ORDER — EFINACONAZOLE 10 % EX SOLN: CUTANEOUS | 1 refills | Status: DC

## 2022-05-28 NOTE — Telephone Encounter (Signed)
Spoke with patient and info given 

## 2022-05-28 NOTE — Telephone Encounter (Signed)
There is another topical medication that may be more effective looks like it is covered by her insurance-I sent this to her pharmacy[facility] and you apply daily to the affected nails for 48 weeks.

## 2022-05-29 NOTE — Patient Instructions (Addendum)
Avoid frequent bending and stooping  No lifting greater than 10 lbs. May use ice or moist heat for pain. Weight loss is of benefit. Best medication for lumbar disc disease is arthritis medications like motrin, celebrex and naprosyn. Exercise is important to improve your indurance and does allow people to function better inspite of back pain.  Avoid frequent bending and stooping Consider EMG/NCV of the  legs to assess for any ongoiing lumbar radiculopathy No lifting greater than 10 lbs. May use ice or moist heat for pain. Weight loss is of benefit. Pain management is recommended A spinal cord stimulator is also a consideration. Exercise is important to improve your indurance and does allow people to function better inspite of back pain.

## 2022-05-29 NOTE — Progress Notes (Signed)
Review of this patient's chart and my schedule for this day indicates that I was in the OR this day and did not see any patients. She was apparently seen on 04/26/2019 and Note is present from that day.

## 2022-05-29 NOTE — Progress Notes (Signed)
Office Visit Note   Patient: Jill Phillips           Date of Birth: [birth year]           MRN: [id number] Visit Date: 06/17/2019              Requested by: [name], MD [place],  [place] [address] PCP: [name], MD   Assessment & Plan: Visit Diagnoses:  1. Degenerative disc disease, thoracic   2. Sacroiliac joint pain   3. Chronic rectal pain   4. Fusion of spine of thoracolumbar region   5. Thoracic spondylosis with radiculopathy     Plan: The results of patients CT scan are discussed with her and it is noted that the area of concern for pseudarthrosis appears healed on the most  Recent study The previous study in 5448 over 90 years old. Discusses options concerning her back. It apparent that the most severe pain is in the sacrum and  Rectum and not upper dorsal or lumbodorsal junction. This being the case I can not recommend surgery that typically would be of benefit for mechanical back pain And not her major pain condition that appears to be internal pelvic pain that is as she describes rectal pain.  Avoid frequent bending and stooping  No lifting greater than 10 lbs. May use ice or moist heat for pain. Weight loss is of benefit. Best medication for lumbar disc disease is arthritis medications like motrin, celebrex and naprosyn. Exercise is important to improve your indurance and does allow people to function better inspite of back pain.  Avoid frequent bending and stooping Consider EMG/NCV of the  legs to assess for any ongoiing lumbar radiculopathy No lifting greater than 10 lbs. May use ice or moist heat for pain. Weight loss is of benefit. Pain management is recommended A spinal cord stimulator is also a consideration. Exercise is important to improve your indurance and does allow people to function better inspite of back pain Follow-Up Instructions: No follow-ups on file.   Orders:  No orders of the defined types were placed in this  encounter.  No orders of the defined types were placed in this encounter.     Procedures: No procedures performed   Clinical Data: No additional findings.   Subjective: Chief Complaint  Patient presents with   Lower Back - Follow-up    66 year old female with history of long thoracolumbar fusion. She has a list of the thorax to the right side and complaints that are mainly referred to her sacrum, pelvic area and rectum. No change in pain pattern is over 3-4 months. I had seen her last week and considered performing an  Extension of the fusion to the mid thoracic level but the CT scan ordered did not show a great deal of nerve compression and areas of previously suspected pseudarthrosis had gone on to heal.     Review of Systems  Constitutional: Negative.   HENT: Negative.    Eyes: Negative.   Respiratory: Negative.    Cardiovascular: Negative.   Gastrointestinal: Negative.   Endocrine: Negative.   Genitourinary: Negative.   Musculoskeletal: Negative.   Skin: Negative.   Allergic/Immunologic: Negative.   Neurological: Negative.   Hematological: Negative.   Psychiatric/Behavioral: Negative.       Objective: Vital Signs: BP (!) 160/86 (BP Location: Left Arm, Patient Position: Sitting)   Pulse (!) 109   Ht '5\' 3"'$  (1.6 m)   Wt 145  lb (65.8 kg)   BMI 25.69 kg/m   Physical Exam Constitutional:      Appearance: She is well-developed.  HENT:     Head: Normocephalic and atraumatic.  Eyes:     Pupils: Pupils are equal, round, and reactive to light.  Pulmonary:     Effort: Pulmonary effort is normal.     Breath sounds: Normal breath sounds.  Abdominal:     General: Bowel sounds are normal.     Palpations: Abdomen is soft.  Musculoskeletal:     Cervical back: Normal range of motion and neck supple.     Lumbar back: Negative right straight leg raise test and negative left straight leg raise test.  Skin:    General: Skin is warm and dry.  Neurological:     Mental  Status: She is alert and oriented to person, place, and time.  Psychiatric:        Behavior: Behavior normal.        Thought Content: Thought content normal.        Judgment: Judgment normal.     Back Exam   Tenderness  The patient is experiencing tenderness in the sacroiliac, thoracic and lumbar.  Range of Motion  Extension:  30 abnormal  Flexion:  40 abnormal  Lateral bend right:  abnormal  Lateral bend left:  abnormal  Rotation right:  abnormal  Rotation left:  abnormal   Muscle Strength  Right Quadriceps:  5/5  Left Quadriceps:  5/5  Right Hamstrings:  5/5  Left Hamstrings:  5/5   Tests  Straight leg raise right: negative Straight leg raise left: negative  Reflexes  Patellar:  0/4 Achilles:  0/4  Other  Toe walk: normal Heel walk: normal Erythema: no back redness Scars: present  Comments:  No motor deficit, SLR negative both sides,        Specialty Comments:  No specialty comments available.  Imaging: No results found.   PMFS History: Patient Active Problem List   Diagnosis Date Noted   Degenerative disc disease, lumbar 08/01/2015    Priority: High    Class: Chronic   Spondylolisthesis of lumbar region 08/01/2015    Priority: High    Class: Chronic   Unilateral primary osteoarthritis, left knee 01/09/2022   Onychomycosis 10/04/2021   Diarrhea 10/04/2021   Itching 07/16/2021   AKI (acute kidney injury) ([facility]) 02/04/2021   Overdose 02/04/2021   Acute gout 06/20/2020   Interstitial cystitis 01/21/2020   Status post shoulder surgery 09/09/2019   Nausea 08/19/2019   Anxiety 05/04/2019   Insomnia 05/04/2019   Syncope 12/08/2018   Disorder of rotator cuff syndrome of left shoulder and allied disorder 06/22/2018   Bilateral leg edema 06/04/2018   Type 2 diabetes mellitus without complication, without long-term current use of insulin ([facility]) 12/02/2017   Sacral pain 09/17/2017   COPD (chronic obstructive pulmonary disease) ([facility]) 04/21/2017    Lower back pain 03/03/2017   CKD (chronic kidney disease) stage 3, GFR 30-59 ml/min (HCC) 08/07/2016   GERD (gastroesophageal reflux disease) 08/07/2016   Chronic respiratory failure ([facility]) 07/29/2016   Arthritis of carpometacarpal (CMC) joint of left thumb 07/09/2016   Pneumothorax on left 01/31/2016   Chronic, continuous use of opioids 09/06/2015   Hypoxia 09/14/2014   Urinary, incontinence, stress female 05/06/2014   Constipation 05/05/2014   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/14/2013   HTN (hypertension) 05/19/2013   HLD (hyperlipidemia) 05/19/2013   Depression 05/19/2013   Hypothyroidism 05/19/2013   Tobacco abuse    Osteoarthritis of  both knees 11/18/2011   RLS (restless legs syndrome) 11/18/2011   Past Medical History:  Diagnosis Date   Active smoker    Anxiety    Calcifying tendinitis of shoulder    Chronic back pain    Chronic pain syndrome    COPD (chronic obstructive pulmonary disease) (HCC)    Dysthymic disorder    Emphysema lung (HCC)    GERD (gastroesophageal reflux disease)    Headache    "weekly maybe" (01/31/2016)   Heart murmur    for years, nothing to be concerned about   Herpes genitalia    History of blood transfusion 1980   related to "back surgery"   Hyperlipidemia    Hypertension    Hypothyroidism    Lumbago    Osteoarthrosis, unspecified whether generalized or localized, lower leg    Pain in joint, upper arm    Pneumothorax, left 01/31/2016   S/P Left posterior subcostal pain injection on 01/30/2016   PONV (postoperative nausea and vomiting)    gets nauseous  with longer surgery. Difficuty voiding after surgery   Postlaminectomy syndrome, thoracic region    Primary localized osteoarthrosis, lower leg    Restless leg syndrome    Sleep apnea    s/p surgery- last sleep study 2011- doesnt use oxygen or machine at night as instructed,.   12/2014- Dr Halford Chessman  reports it is negative.   Thyroid disease     Family History  Problem Relation Age of Onset   Kidney disease  Mother    Heart disease Father    Anuerysm Brother 62       brain   Heart disease Brother    Heart disease Sister 83       s/p CABG   Hypertension Sister    Colon cancer Neg Hx     Past Surgical History:  Procedure Laterality Date   APPENDECTOMY     BACK SURGERY     18 back surgeries (2 thoracic & 16 lumbar) (01/31/2016)   DILATION AND CURETTAGE OF UTERUS     HAMMER TOE SURGERY     IR RADIOLOGIST EVAL & MGMT  07/21/2017   JOINT REPLACEMENT     KNEE ARTHROSCOPY Right    LAPAROSCOPIC CHOLECYSTECTOMY     LUMBAR FUSION N/A 08/01/2015   Procedure: Right sided L1-2 and L2-3 transforaminal lumbar interbody fusion with cages, Extension of posterior fusion T12 to L3, Replaced pedicle screws bilaterally L1-L2 , Replaced left sided pedicle screws L-3. Instrumentation T12 to L3 using local bone graft, Vivigen allograft and cancellous chips;  Surgeon: [name], MD;  Location: [facility];  Service: Orthopedics;  Laterality: N/A;   LUMBAR LAMINECTOMY/DECOMPRESSION MICRODISCECTOMY N/A 01/28/2014   Procedure: Minimally Invasive Right  L1-2 Microdiscectomy;  Surgeon: [name], MD;  Location: [facility];  Service: Orthopedics;  Laterality: N/A;   REVERSE SHOULDER ARTHROPLASTY Left 09/09/2019   Procedure: LEFT REVERSE SHOULDER REPLACEMENT;  Surgeon: Meredith Pel, MD;  Location: [facility];  Service: Orthopedics;  Laterality: Left;   TOTAL HIP ARTHROPLASTY Right    TOTAL KNEE ARTHROPLASTY  05/29/2012   Procedure: TOTAL KNEE ARTHROPLASTY;  Surgeon: Mcarthur Rossetti, MD;  Location: [facility];  Service: Orthopedics;  Laterality: Right;  Right Total Knee Arthroplasty   TUBAL LIGATION     UVULOPALATOPHARYNGOPLASTY     VAGINAL HYSTERECTOMY     Social History   Occupational History   Occupation: disability    Comment: back surgeries  Tobacco Use   Smoking status: Every Day  Packs/day: 1.25    Years: 45.00    Total pack years: 56.25    Types: Cigarettes   Smokeless tobacco: Never   Tobacco  comments:    form given 12/25/16  Vaping Use   Vaping Use: Never used  Substance and Sexual Activity   Alcohol use: No    Alcohol/week: 0.0 standard drinks of alcohol   Drug use: No    Types: Marijuana    Comment: 01/31/2016 "none since ~ 1980"   Sexual activity: Never    Partners: Male

## 2022-05-30 NOTE — Progress Notes (Signed)
Office Visit Note   Patient: Jill Phillips           Date of Birth: 1956-02-26           MRN: [id number] Visit Date: 06/04/2021              Requested by: [name], MD [place],  [place] [address] PCP: [name], MD   Assessment & Plan: Visit Diagnoses:  1. Low back pain, unspecified back pain laterality, unspecified chronicity, unspecified whether sciatica present   2. 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 Fusion of spine of thoracolumbar region     Plan: Avoid frequent bending and stooping  No lifting greater than 10 lbs. May use ice or moist heat for pain. Weight loss is of benefit. Best medication for lumbar disc disease is arthritis medications like motrin, celebrex and naprosyn. Exercise is important to improve your indurance and does allow people to function better inspite of back pain.    Follow-Up Instructions: Return in about 3 weeks (around 06/25/2021).   Orders:  No orders of the defined types were placed in this encounter.  No orders of the defined types were placed in this encounter.     Procedures: No procedures performed   Clinical Data: No additional findings.   Subjective: Chief Complaint  Patient presents with   Lower Back - Follow-up    C/o pain on right side if torso, back still hurts, can't do daily chores   Left Knee - Follow-up    Helped some for a little while but still hurts a lot.    66 year old female with persistent back and buttock and leg pain post op thoracolumbar fusion. She has pain in the pelvis she describes as being rectal pain. No bowel or bladder incontinence. Pain with any activity. Previous studies have suggested non union of an upper lumbar fusion segment  L2-3. She has proximal degenerative changes above a T10 to S1 fusion but much of her pain is lower sacral and pelvic pain.     Review of Systems  Constitutional: Negative.   HENT: Negative.    Eyes: Negative.   Respiratory:  Negative.    Cardiovascular: Negative.   Gastrointestinal: Negative.   Endocrine: Negative.   Genitourinary: Negative.   Musculoskeletal: Negative.   Skin: Negative.   Allergic/Immunologic: Negative.   Neurological: Negative.   Hematological: Negative.   Psychiatric/Behavioral: Negative.       Objective: Vital Signs: BP 130/79 (BP Location: Left Arm, Patient Position: Sitting)   Pulse 74   Ht '5\' 3"'$  (1.6 m)   Wt 153 lb (69.4 kg)   BMI 27.10 kg/m   Physical Exam Constitutional:      Appearance: She is well-developed.  HENT:     Head: Normocephalic and atraumatic.  Eyes:     Pupils: Pupils are equal, round, and reactive to light.  Pulmonary:     Effort: Pulmonary effort is normal.     Breath sounds: Normal breath sounds.  Abdominal:     General: Bowel sounds are normal.     Palpations: Abdomen is soft.  Musculoskeletal:     Cervical back: Normal range of motion and neck supple.     Lumbar back: Negative right straight leg raise test and negative left straight leg raise test.  Skin:    General: Skin is warm and dry.  Neurological:     Mental Status: She is alert and oriented to person, place, and  time.  Psychiatric:        Behavior: Behavior normal.        Thought Content: Thought content normal.        Judgment: Judgment normal.     Back Exam   Tenderness  The patient is experiencing tenderness in the thoracic, sacroiliac and lumbar.  Range of Motion  Extension:  abnormal  Flexion:  abnormal  Lateral bend right:  abnormal  Lateral bend left:  abnormal  Rotation right:  abnormal  Rotation left:  abnormal   Muscle Strength  Right Quadriceps:  5/5  Left Quadriceps:  5/5  Right Hamstrings:  5/5  Left Hamstrings:  5/5   Tests  Straight leg raise right: negative Straight leg raise left: negative  Reflexes  Patellar:  0/4 Achilles:  0/4  Other  Toe walk: normal Heel walk: normal Sensation: normal Gait: normal   Comments:  Pain is deep pelvic  could be due to lower thoracic or upper lumbar nerve compression though "rectal pain" not a usual complaint.      Specialty Comments:  No specialty comments available.  Imaging: No results found.   PMFS History: Patient Active Problem List   Diagnosis Date Noted   Degenerative disc disease, lumbar 08/01/2015    Priority: High    Class: Chronic   Spondylolisthesis of lumbar region 08/01/2015    Priority: High    Class: Chronic   Unilateral primary osteoarthritis, left knee 01/09/2022   Onychomycosis 10/04/2021   Diarrhea 10/04/2021   Itching 07/16/2021   AKI (acute kidney injury) ([facility]) 02/04/2021   Overdose 02/04/2021   Acute gout 06/20/2020   Interstitial cystitis 01/21/2020   Status post shoulder surgery 09/09/2019   Nausea 08/19/2019   Anxiety 05/04/2019   Insomnia 05/04/2019   Syncope 12/08/2018   Disorder of rotator cuff syndrome of left shoulder and allied disorder 06/22/2018   Bilateral leg edema 06/04/2018   Type 2 diabetes mellitus without complication, without long-term current use of insulin ([facility]) 12/02/2017   Sacral pain 09/17/2017   COPD (chronic obstructive pulmonary disease) ([facility]) 04/21/2017   Lower back pain 03/03/2017   CKD (chronic kidney disease) stage 3, GFR 30-59 ml/min (HCC) 08/07/2016   GERD (gastroesophageal reflux disease) 08/07/2016   Chronic respiratory failure ([facility]) 07/29/2016   Arthritis of carpometacarpal (CMC) joint of left thumb 07/09/2016   Pneumothorax on left 01/31/2016   Chronic, continuous use of opioids 09/06/2015   Hypoxia 09/14/2014   Urinary, incontinence, stress female 05/06/2014   Constipation 05/05/2014   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/14/2013   HTN (hypertension) 05/19/2013   HLD (hyperlipidemia) 05/19/2013   Depression 05/19/2013   Hypothyroidism 05/19/2013   Tobacco abuse    Osteoarthritis of both knees 11/18/2011   RLS (restless legs syndrome) 11/18/2011   Past Medical History:  Diagnosis Date   Active smoker    Anxiety     Calcifying tendinitis of shoulder    Chronic back pain    Chronic pain syndrome    COPD (chronic obstructive pulmonary disease) (H[facility])    Dysthymic disorder    Emphysema lung (HCC)    GERD (gastroesophageal reflux disease)    Headache    "weekly maybe" (01/31/2016)   Heart murmur    for years, nothing to be concerned about   Herpes genitalia    History of blood transfusion 1980   related to "back surgery"   Hyperlipidemia    Hypertension    Hypothyroidism    Lumbago    Osteoarthrosis, unspecified whether generalized or localized,  lower leg    Pain in joint, upper arm    Pneumothorax, left 01/31/2016   S/P Left posterior subcostal pain injection on 01/30/2016   PONV (postoperative nausea and vomiting)    gets nauseous  with longer surgery. Difficuty voiding after surgery   Postlaminectomy syndrome, thoracic region    Primary localized osteoarthrosis, lower leg    Restless leg syndrome    Sleep apnea    s/p surgery- last sleep study 2011- doesnt use oxygen or machine at night as instructed,.   12/2014- Dr Halford Chessman  reports it is negative.   Thyroid disease     Family History  Problem Relation Age of Onset   Kidney disease Mother    Heart disease Father    Anuerysm Brother 92       brain   Heart disease Brother    Heart disease Sister 43       s/p CABG   Hypertension Sister    Colon cancer Neg Hx     Past Surgical History:  Procedure Laterality Date   APPENDECTOMY     BACK SURGERY     18 back surgeries (2 thoracic & 16 lumbar) (01/31/2016)   DILATION AND CURETTAGE OF UTERUS     HAMMER TOE SURGERY     IR RADIOLOGIST EVAL & MGMT  07/21/2017   JOINT REPLACEMENT     KNEE ARTHROSCOPY Right    LAPAROSCOPIC CHOLECYSTECTOMY     LUMBAR FUSION N/A 08/01/2015   Procedure: Right sided L1-2 and L2-3 transforaminal lumbar interbody fusion with cages, Extension of posterior fusion T12 to L3, Replaced pedicle screws bilaterally L1-L2 , Replaced left sided pedicle screws L-3.  Instrumentation T12 to L3 using local bone graft, Vivigen allograft and cancellous chips;  Surgeon: [name], MD;  Location: [facility];  Service: Orthopedics;  Laterality: N/A;   LUMBAR LAMINECTOMY/DECOMPRESSION MICRODISCECTOMY N/A 01/28/2014   Procedure: Minimally Invasive Right  L1-2 Microdiscectomy;  Surgeon: [name], MD;  Location: [facility];  Service: Orthopedics;  Laterality: N/A;   REVERSE SHOULDER ARTHROPLASTY Left 09/09/2019   Procedure: LEFT REVERSE SHOULDER REPLACEMENT;  Surgeon: Meredith Pel, MD;  Location: [facility];  Service: Orthopedics;  Laterality: Left;   TOTAL HIP ARTHROPLASTY Right    TOTAL KNEE ARTHROPLASTY  05/29/2012   Procedure: TOTAL KNEE ARTHROPLASTY;  Surgeon: Mcarthur Rossetti, MD;  Location[facility];  Service: Orthopedics;  Laterality: Right;  Right Total Knee Arthroplasty   TUBAL LIGATION     UVULOPALATOPHARYNGOPLASTY     VAGINAL HYSTERECTOMY     Social History   Occupational History   Occupation: disability    Comment: back surgeries  Tobacco Use   Smoking status: Every Day    Packs/day: 1.25    Years: 45.00    Total pack years: 56.25    Types: Cigarettes   Smokeless tobacco: Never   Tobacco comments:    form given 12/25/16  Vaping Use   Vaping Use: Never used  Substance and Sexual Activity   Alcohol use: No    Alcohol/week: 0.0 standard drinks of alcohol   Drug use: No    Types: Marijuana    Comment: 01/31/2016 "none since ~ 1980"   Sexual activity: Never    Partners: Male

## 2022-06-14 DIAGNOSIS — M545 Low back pain, unspecified: Secondary | ICD-10-CM | POA: Diagnosis not present

## 2022-06-14 DIAGNOSIS — Z79899 Other long term (current) drug therapy: Secondary | ICD-10-CM | POA: Diagnosis not present

## 2022-06-14 DIAGNOSIS — K5909 Other constipation: Secondary | ICD-10-CM | POA: Diagnosis not present

## 2022-06-19 ENCOUNTER — Encounter: Payer: Self-pay | Admitting: Orthopaedic Surgery

## 2022-06-19 ENCOUNTER — Ambulatory Visit (INDEPENDENT_AMBULATORY_CARE_PROVIDER_SITE_OTHER): Payer: Medicare Other | Admitting: Orthopaedic Surgery

## 2022-06-19 DIAGNOSIS — M25562 Pain in left knee: Secondary | ICD-10-CM

## 2022-06-19 DIAGNOSIS — G8929 Other chronic pain: Secondary | ICD-10-CM

## 2022-06-19 DIAGNOSIS — M1712 Unilateral primary osteoarthritis, left knee: Secondary | ICD-10-CM

## 2022-06-19 MED ORDER — SODIUM HYALURONATE 60 MG/3ML IX PRSY
60.0000 mg | PREFILLED_SYRINGE | INTRA_ARTICULAR | Status: AC | PRN
  Administered 2022-06-19: 60 mg via INTRA_ARTICULAR

## 2022-06-19 NOTE — Progress Notes (Signed)
   Procedure Note  Patient: Jill Phillips             Date of Birth: 1955/12/26           MRN: [id number]             Visit Date: 06/19/2022  Procedures: Visit Diagnoses:  1. Chronic pain of left knee   2. Unilateral primary osteoarthritis, left knee     Large Joint Inj: L knee on 06/19/2022 11:00 AM Indications: diagnostic evaluation and pain Details: 22 G 1.5 in needle, superolateral approach  Arthrogram: No  Medications: 60 mg Sodium Hyaluronate 60 MG/3ML Outcome: tolerated well, no immediate complications Procedure, treatment alternatives, risks and benefits explained, specific risks discussed. Consent was given by the patient. Immediately prior to procedure a time out was called to verify the correct patient, procedure, equipment, support staff and site/side marked as required. Patient was prepped and draped in the usual sterile fashion.    The patient is here today for scheduled hyaluronic acid injection in her left knee with Durolane to treat the pain from osteoarthritis.  She has tried and failed other forms of conservative treatment.  We have actually replaced her right knee before.  She does ambulate with a cane.  Her right knee shows no effusion.  I did place lidocaine in the knee followed by Durolane which she tolerated well.  All questions and concerns were answered and addressed.  Follow-up is as needed.  She understands it could take 4 to 6 weeks to build up in the knee and if it works she can always have this again potentially in 6 months.  Another option would be a knee replacement if it does not work. Lot# [phone]

## 2022-06-20 ENCOUNTER — Telehealth: Payer: Self-pay | Admitting: Orthopaedic Surgery

## 2022-06-20 DIAGNOSIS — Z79899 Other long term (current) drug therapy: Secondary | ICD-10-CM | POA: Diagnosis not present

## 2022-06-20 NOTE — Telephone Encounter (Signed)
Pt called requesting a call from Dr Ninfa Linden or [place]. Pt states she had an injection yesterday and she is experiencing more pain and wanted to know if that is normal. Please call pt at [phone].

## 2022-06-20 NOTE — Telephone Encounter (Signed)
I called and advised pt that this is normal. Instructed pt to ice and try compression wrap during the day.. I advised her to give it a couple days and if not better by Monday to give [place] a call. She stated understanding

## 2022-06-21 ENCOUNTER — Other Ambulatory Visit: Payer: Self-pay | Admitting: Internal Medicine

## 2022-06-24 ENCOUNTER — Telehealth: Payer: Self-pay | Admitting: Orthopaedic Surgery

## 2022-06-24 NOTE — Telephone Encounter (Signed)
Pt calling and states that after the numbing medicine from the viscoelastic supplement injection, pt states that she feels as if the pain was much worse than before the injection. Pt is having a hard time walking and its making her back pain worse. She states she is having to use a cain right now. She states the pain is mainly in the back of the leg. Can we please call pt to advise what to do?   CB [phone]

## 2022-06-25 DIAGNOSIS — R0602 Shortness of breath: Secondary | ICD-10-CM | POA: Diagnosis not present

## 2022-06-25 DIAGNOSIS — J449 Chronic obstructive pulmonary disease, unspecified: Secondary | ICD-10-CM | POA: Diagnosis not present

## 2022-07-02 ENCOUNTER — Other Ambulatory Visit: Payer: Self-pay | Admitting: Internal Medicine

## 2022-07-02 ENCOUNTER — Telehealth: Payer: Self-pay | Admitting: Internal Medicine

## 2022-07-02 MED ORDER — NICOTINE 21 MG/24HR TD PT24: 21.0000 mg | MEDICATED_PATCH | Freq: Every day | TRANSDERMAL | 1 refills | Status: DC

## 2022-07-02 NOTE — Telephone Encounter (Signed)
sent 

## 2022-07-02 NOTE — Telephone Encounter (Signed)
Pt called to request NICOTINE PATCH called in to [facility] before 12:30 pm so pt can get it filled today. Pt phone [phone]

## 2022-07-04 ENCOUNTER — Other Ambulatory Visit: Payer: Self-pay | Admitting: Internal Medicine

## 2022-07-08 ENCOUNTER — Telehealth: Payer: Self-pay | Admitting: Internal Medicine

## 2022-07-08 NOTE — Telephone Encounter (Signed)
LVM for pt to rtn my call to schedule AWV-I with [facility] call back # [phone] 

## 2022-07-12 ENCOUNTER — Other Ambulatory Visit: Payer: Self-pay

## 2022-07-12 ENCOUNTER — Other Ambulatory Visit: Payer: Self-pay | Admitting: Internal Medicine

## 2022-07-12 DIAGNOSIS — Z79899 Other long term (current) drug therapy: Secondary | ICD-10-CM | POA: Diagnosis not present

## 2022-07-12 DIAGNOSIS — M545 Low back pain, unspecified: Secondary | ICD-10-CM | POA: Diagnosis not present

## 2022-07-12 DIAGNOSIS — K5909 Other constipation: Secondary | ICD-10-CM | POA: Diagnosis not present

## 2022-07-14 NOTE — Patient Instructions (Signed)

## 2022-07-14 NOTE — Progress Notes (Unsigned)
Subjective:   Jill Phillips is a 66 y.o. female who presents for an Initial Medicare Annual Wellness Visit. I connected with  [name] on 07/15/22 by a audio enabled telemedicine application and verified that I am speaking with the correct person using two identifiers.  Patient Location: Home  Provider Location: Home Office  I discussed the limitations of evaluation and management by telemedicine. The patient expressed understanding and agreed to proceed.  Review of Systems    Deferred to PCP Cardiac Risk Factors include: advanced age (>68mn, >>59women);diabetes mellitus;dyslipidemia;hypertension;obesity (BMI >30kg/m2)     Objective:    Today's Vitals   07/15/22 1543  PainSc: 4    There is no height or weight on file to calculate BMI.     07/15/2022    4:11 PM 04/23/2021    9:46 PM 02/05/2021    1:13 PM 02/05/2021    3:44 AM 09/07/2019    1:19 PM 08/01/2019    2:34 PM 12/02/2018   11:08 PM  Advanced Directives  Does Patient Have a Medical Advance Directive? _0  No No  Would patient like information on creating a medical advance directive? No - Patient declined No - Patient declined No - Patient declined  No - Patient declined No - Patient declined No - Patient declined    Current Medications (verified) Outpatient Encounter Medications as of 07/15/2022  Medication Sig   acetaminophen (TYLENOL) 500 MG tablet Take 1,500 mg by mouth daily as needed for moderate pain or headache.   albuterol (VENTOLIN HFA) 108 (90 Base) MCG/ACT inhaler INHALE 2 PUFFS INTO THE LUNGS EVERY 6 HOURS AS NEEDED FOR WHEEZING OR SHORTNESS OF BREATH.   amLODipine-benazepril (LOTREL) 10-40 MG capsule TAKE 1 CAPSULE BY MOUTH DAILY.   atorvastatin (LIPITOR) 20 MG tablet TAKE 1 TABLET BY MOUTH DAILY.   clonazePAM (KLONOPIN) 1 MG tablet TAKE 1 TABLET BY MOUTH DAILY.   docusate sodium (COLACE) 100 MG capsule Take 200 mg by mouth at bedtime.   Efinaconazole 10 % SOLN Apply daily to affected  nails for 48 weeks   fentaNYL (DURAGESIC) 12 MCG/HR Place 1 patch onto the skin every 3 (three) days.   gabapentin (NEURONTIN) 400 MG capsule TAKE 1 CAPSULE BY MOUTH 3 TIMES DAILY.   hydrochlorothiazide (HYDRODIURIL) 12.5 MG tablet TAKE 1 TABLET (12.5 MG TOTAL) BY MOUTH DAILY.   HYDROcodone-acetaminophen (NORCO) 10-325 MG tablet Take 1 tablet by mouth every 6 (six) hours as needed for severe pain (Chronic back pain). (Patient taking differently: Take 1-4 tablets by mouth 3 (three) times daily.)   levothyroxine (SYNTHROID) 88 MCG tablet TAKE 1 TABLET BY MOUTH DAILY.   Melatonin 10 MG TABS Take 30 mg by mouth at bedtime.   NARCAN 4 MG/0.1ML LIQD nasal spray kit Place 1 spray into the nose as needed (accidental overdose).   Nebivolol HCl 20 MG TABS TAKE 1 TABLET (20 MG TOTAL) BY MOUTH DAILY.   nicotine (NICODERM CQ - DOSED IN MG/24 HOURS) 21 mg/24hr patch Place 1 patch (21 mg total) onto the skin daily.   omeprazole (PRILOSEC) 40 MG capsule TAKE 1 CAPSULE BY MOUTH 2 TIMES DAILY BEFORE A MEAL.   promethazine (PHENERGAN) 50 MG tablet Take 1 tablet (50 mg total) by mouth every 8 (eight) hours as needed for nausea or vomiting.   QUEtiapine (SEROQUEL) 200 MG tablet Take 1 tablet (200 mg total) by mouth at bedtime.   solifenacin (VESICARE) 5 MG tablet Take 5 mg by mouth 2 (  two) times daily.   SPIRIVA HANDIHALER 18 MCG inhalation capsule PLACE 1 CAPSULE INTO INHALER AND INHALE DAILY.   tiZANidine (ZANAFLEX) 4 MG tablet TAKE 1 TABLET BY MOUTH EVERY 8 HOURS AS NEEDED   valACYclovir (VALTREX) 500 MG tablet TAKE 1 TABLET BY MOUTH DAILY.   venlafaxine XR (EFFEXOR-XR) 150 MG 24 hr capsule Plan TAKE 2 CAPSULES BY MOUTH DAILY.   albuterol (PROVENTIL) (2.5 MG/3ML) 0.083% nebulizer solution Take 3 mLs (2.5 mg total) by nebulization every 4 (four) hours as needed for wheezing or shortness of breath. (Patient not taking: Reported on 09/18/2021)   rOPINIRole (REQUIP) 1 MG tablet TAKE 1 TO 2 TABLETS BY MOUTH TWO TIMES  DAILY AS NEEDED (Patient not taking: Reported on 07/15/2022)   [DISCONTINUED] clonazePAM (KLONOPIN) 1 MG tablet TAKE 1 TABLET BY MOUTH DAILY.   No facility-administered encounter medications on file as of 07/15/2022.    Allergies (verified) Flagyl [metronidazole], Limonene, Sulfa antibiotics, Doxycycline, Amoxicillin, Chlorzoxazone, Codeine, Darvocet [propoxyphene n-acetaminophen], Dilaudid [hydromorphone hcl], Keflex [cephalexin], Morphine and related, Nitrofurantoin monohyd macro, Percocet [oxycodone-acetaminophen], and Trazodone and nefazodone   History: Past Medical History:  Diagnosis Date   Active smoker    Anxiety    Calcifying tendinitis of shoulder    Chronic back pain    Chronic pain syndrome    COPD (chronic obstructive pulmonary disease) (HCC)    Dysthymic disorder    Emphysema lung (HCC)    GERD (gastroesophageal reflux disease)    Headache    "weekly maybe" (01/31/2016)   Heart murmur    for years, nothing to be concerned about   Herpes genitalia    History of blood transfusion 1980   related to "back surgery"   Hyperlipidemia    Hypertension    Hypothyroidism    Lumbago    Osteoarthrosis, unspecified whether generalized or localized, lower leg    Pain in joint, upper arm    Pneumothorax, left 01/31/2016   S/P Left posterior subcostal pain injection on 01/30/2016   PONV (postoperative nausea and vomiting)    gets nauseous  with longer surgery. Difficuty voiding after surgery   Postlaminectomy syndrome, thoracic region    Primary localized osteoarthrosis, lower leg    Restless leg syndrome    Sleep apnea    s/p surgery- last sleep study 2011- doesnt use oxygen or machine at night as instructed,.   12/2014- Dr Halford Chessman  reports it is negative.   Thyroid disease    Past Surgical History:  Procedure Laterality Date   APPENDECTOMY     BACK SURGERY     18 back surgeries (2 thoracic & 16 lumbar) (01/31/2016)   DILATION AND CURETTAGE OF UTERUS     HAMMER TOE SURGERY      IR RADIOLOGIST EVAL & MGMT  07/21/2017   JOINT REPLACEMENT     KNEE ARTHROSCOPY Right    LAPAROSCOPIC CHOLECYSTECTOMY     LUMBAR FUSION N/A 08/01/2015   Procedure: Right sided L1-2 and L2-3 transforaminal lumbar interbody fusion with cages, Extension of posterior fusion T12 to L3, Replaced pedicle screws bilaterally L1-L2 , Replaced left sided pedicle screws L-3. Instrumentation T12 to L3 using local bone graft, Vivigen allograft and cancellous chips;  Surgeon: [name], MD;  Location: [facility];  Service: Orthopedics;  Laterality: N/A;   LUMBAR LAMINECTOMY/DECOMPRESSION MICRODISCECTOMY N/A 01/28/2014   Procedure: Minimally Invasive Right  L1-2 Microdiscectomy;  Surgeon: [name], MD;  Location: [facility];  Service: Orthopedics;  Laterality: N/A;   REVERSE SHOULDER ARTHROPLASTY Left  09/09/2019   Procedure: LEFT REVERSE SHOULDER REPLACEMENT;  Surgeon: Meredith Pel, MD;  Location: [facility];  Service: Orthopedics;  Laterality: Left;   TOTAL HIP ARTHROPLASTY Right    TOTAL KNEE ARTHROPLASTY  05/29/2012   Procedure: TOTAL KNEE ARTHROPLASTY;  Surgeon: Mcarthur Rossetti, MD;  Location: [facility];  Service: Orthopedics;  Laterality: Right;  Right Total Knee Arthroplasty   TUBAL LIGATION     UVULOPALATOPHARYNGOPLASTY     VAGINAL HYSTERECTOMY     Family History  Problem Relation Age of Onset   Kidney disease Mother    Heart disease Father    Anuerysm Brother 72       brain   Heart disease Brother    Heart disease Sister 14       s/p CABG   Hypertension Sister    Colon cancer Neg Hx    Social History   Socioeconomic History   Marital status: Divorced    Spouse name: n/a   Number of children: 2   Years of education: 12+   Highest education level: Not on file  Occupational History   Occupation: disability    Comment: back surgeries  Tobacco Use   Smoking status: Every Day    Packs/day: 1.25    Years: 45.00    Total pack years: 56.25    Types: Cigarettes   Smokeless tobacco:  Never   Tobacco comments:    Uses Nicoderm patch discussed 1-800-quit-now  Vaping Use   Vaping Use: Never used  Substance and Sexual Activity   Alcohol use: No    Alcohol/week: 0.0 standard drinks of alcohol   Drug use: No    Types: Marijuana    Comment: 01/31/2016 "none since ~ 1980"   Sexual activity: Never    Partners: Male  Other Topics Concern   Not on file  Social History Narrative   Lives alone.  One daughter is local, but is getting ready to move to [place], where her children live with their father.  The other daughter lives near [place], [place].   Social Determinants of Health   Financial Resource Strain: Low Risk  (07/15/2022)   Overall Financial Resource Strain (CARDIA)    Difficulty of Paying Living Expenses: Not very hard  Food Insecurity: No Food Insecurity (07/15/2022)   Hunger Vital Sign    Worried About Running Out of Food in the Last Year: Never true    Ran Out of Food in the Last Year: Never true  Transportation Needs: No Transportation Needs (07/15/2022)   PRAPARE - Hydrologist (Medical): No    Lack of Transportation (Non-Medical): No  Physical Activity: Inactive (07/15/2022)   Exercise Vital Sign    Days of Exercise per Week: 0 days    Minutes of Exercise per Session: 0 min  Stress: No Stress Concern Present (07/15/2022)   [place]    Feeling of Stress : Only a little  Social Connections: Socially Isolated (07/15/2022)   Social Connection and Isolation Panel [NHANES]    Frequency of Communication with Friends and Family: More than three times a week    Frequency of Social Gatherings with Friends and Family: More than three times a week    Attends Religious Services: Never    Marine scientist or Organizations: No    Attends Archivist Meetings: Never    Marital Status: Divorced    Tobacco Counseling Ready to quit: Yes Counseling given:  Yes Tobacco comments: Uses Nicoderm patch discussed 1-800-quit-now   Clinical Intake:  Pre-visit preparation completed: Yes  Pain : 0-10 Pain Score: 4  Pain Type: Chronic pain Pain Location: Back (generalized pain) Pain Descriptors / Indicators: Aching, Discomfort, Dull, Constant Pain Relieving Factors: medication Effect of Pain on Daily Activities: decrease pain  Pain Relieving Factors: medication  Nutritional Status: BMI 25 -29 Overweight Nutritional Risks: None Diabetes: Yes CBG done?: No (phone visit) Did pt. bring in CBG monitor from home?: No (phone visit)  How often do you need to have someone help you when you read instructions, pamphlets, or other written materials from your doctor or pharmacy?: 1 - Never What is the last grade level you completed in school?: some college  Diabetic?Yes Nutrition Risk Assessment:  Has the patient had any N/V/D within the last 2 months?  No  Does the patient have any non-healing wounds?  No  Has the patient had any unintentional weight loss or weight gain?  No   Diabetes:  Is the patient diabetic?  Yes  If diabetic, was a CBG obtained today?  No , phone visit Did the patient bring in their glucometer from home?  No , phone visit How often do you monitor your CBG's? Reports she does not take blood sugar; education provided.   Financial Strains and Diabetes Management:  Are you having any financial strains with the device, your supplies or your medication? No .  Does the patient want to be seen by Chronic Care Management for management of their diabetes?  No  Would the patient like to be referred to a Nutritionist or for Diabetic Management?  No   Diabetic Exams:  Diabetic Eye Exam: Overdue for diabetic eye exam. Pt has been advised about the importance in completing this exam. Patient advised to call and schedule an eye exam. Diabetic Foot Exam: Completed 06/11/22   Interpreter Needed?: No  Information entered by :: Emelia Loron RN   Activities of Daily Living    07/15/2022    3:55 PM  In your present state of health, do you have any difficulty performing the following activities:  Hearing? 0  Vision? 0  Difficulty concentrating or making decisions? 0  Walking or climbing stairs? 0  Dressing or bathing? 0  Doing errands, shopping? 0  Preparing Food and eating ? N  Using the Toilet? N  In the past six months, have you accidently leaked urine? N  Do you have problems with loss of bowel control? N  Managing your Medications? N  Managing your Finances? N  Housekeeping or managing your Housekeeping? Y  Comment family assist with transportation    Patient Care Team: [name], MD as PCP - General (Internal Medicine) [place], Darnelle Maffucci, [facility] (Inactive) as Pharmacist (Pharmacist)  Indicate any recent Medical Services you may have received from other than [facility] providers in the past year (date may be approximate).     Assessment:   This is a routine wellness examination for Jill Phillips.  Hearing/Vision screen No results found.  Dietary issues and exercise activities discussed: Current Exercise Habits: The patient does not participate in regular exercise at present, Exercise limited by: orthopedic condition(s);Other - see comments (pain)   Goals Addressed             This Visit's Progress    Patient Stated       Increase the amount of water that I drink by starting with drinking 1 bottle of water per day and increase  each day. Increase by getting involved with my apartment complex senior activities.      Depression Screen    07/15/2022    3:53 PM 01/14/2022   10:51 AM 08/24/2020   10:11 AM 04/04/2020    4:47 PM 12/09/2018   11:25 AM 10/16/2018   10:10 AM 09/16/2018    9:40 AM  PHQ 2/9 Scores  PHQ - 2 Score 2 1 0 0 0 0 0  PHQ- 9 Score _0 Fall Risk    07/15/2022    4:08 PM 01/14/2022   10:55 AM 10/04/2021    9:44 AM 08/24/2020   10:11 AM 04/04/2020    2:51 PM  [place] in  the past year? 1 1 0 0 0  Number falls in past yr: 1 0 0  0  Injury with Fall? 1 1 0  0  Risk for fall due to : History of fall(s);Impaired balance/gait;Impaired mobility;Impaired vision;Medication side effect No Fall Risks No Fall Risks  No Fall Risks  Follow up Falls evaluation completed;Education provided Falls evaluation completed Falls evaluation completed      [place]:  Any stairs in or around the home? Yes  If so, are there any without handrails? Yes  Home free of loose throw rugs in walkways, pet beds, electrical cords, etc? Yes  Adequate lighting in your home to reduce risk of falls? Yes   ASSISTIVE DEVICES UTILIZED TO PREVENT FALLS:  Life alert? Yes  Use of a cane, walker or w/c? Yes  Grab bars in the bathroom? Yes  Shower chair or bench in shower? Yes  Elevated toilet seat or a handicapped toilet? Yes   Cognitive Function:        07/15/2022    4:15 PM  6CIT Screen  What Year? 0 points  What month? 0 points  What time? 0 points  Count back from 20 0 points  Months in reverse 0 points  Repeat phrase 0 points  Total Score 0 points    Immunizations Immunization History  Administered Date(s) Administered   Influenza, Seasonal, Injecte, Preservative Fre 08/05/2012   Influenza,inj,Quad PF,6+ Mos 05/05/2014, 05/22/2015, 07/29/2016, 06/04/2018   Influenza-Unspecified 07/16/2017, 07/30/2020   Moderna Sars-Covid-2 Vaccination 01/13/2020, 07/30/2020   Pneumococcal Conjugate-13 12/02/2017   Tdap 07/14/2013    TDAP status: Up to date  Flu Vaccine status: Due, Education has been provided regarding the importance of this vaccine. Advised may receive this vaccine at local pharmacy or Health Dept. Aware to provide a copy of the vaccination record if obtained from local pharmacy or Health Dept. Verbalized acceptance and understanding.  Pneumococcal vaccine status: Due, Education has been provided regarding the importance of this  vaccine. Advised may receive this vaccine at local pharmacy or Health Dept. Aware to provide a copy of the vaccination record if obtained from local pharmacy or Health Dept. Verbalized acceptance and understanding.  Covid-19 vaccine status: Information provided on how to obtain vaccines.   Qualifies for Shingles Vaccine? Yes   Zostavax completed No   Shingrix Completed?: No.    Education has been provided regarding the importance of this vaccine. Patient has been advised to call insurance company to determine out of pocket expense if they have not yet received this vaccine. Advised may also receive vaccine at local pharmacy or Health Dept. Verbalized acceptance and understanding.  Screening Tests Health Maintenance  Topic Date Due   Zoster Vaccines- Shingrix (  1 of 2) Never done   Pneumonia Vaccine 72+ Years old (2 - PPSV23 or PCV20) 01/27/2018   OPHTHALMOLOGY EXAM  12/10/2018   COVID-19 Vaccine (3 - Moderna risk series) 08/27/2020   INFLUENZA VACCINE  04/02/2022   Lung Cancer Screening  06/23/2022   HEMOGLOBIN A1C  07/14/2022   MAMMOGRAM  01/12/2023 (Originally 02/05/2005)   COLONOSCOPY (Pts 45-88yr Insurance coverage will need to be confirmed)  01/12/2023 (Originally 11/23/2019)   Diabetic kidney evaluation - GFR measurement  01/12/2023   Diabetic kidney evaluation - Urine ACR  01/12/2023   FOOT EXAM  06/12/2023   TETANUS/TDAP  07/15/2023   Medicare Annual Wellness (AWV)  07/16/2023   DEXA SCAN  Completed   Hepatitis C Screening  Completed   HPV VACCINES  Aged Out    Health Maintenance  Health Maintenance Due  Topic Date Due   Zoster Vaccines- Shingrix (1 of 2) Never done   Pneumonia Vaccine 67 Years old (2 - PPSV23 or PCV20) 01/27/2018   OPHTHALMOLOGY EXAM  12/10/2018   COVID-19 Vaccine (3 - Moderna risk series) 08/27/2020   INFLUENZA VACCINE  04/02/2022   Lung Cancer Screening  06/23/2022   HEMOGLOBIN A1C  07/14/2022    Colorectal Screening: Patient states she will  discuss Cologuard with PCP  Mammogram status: [place] provided with contact info and advised to call to schedule appt.   Bone Density status: Completed 05/27/19. Results reflect: Bone density results: NORMAL. Repeat every once years.  Lung Cancer Screening: (Low Dose CT Chest recommended if Age 67-80years, 30 pack-year currently smoking OR have quit w/in 15years.) does qualify.   Lung Cancer Screening Referral: Deferred to PCP  Additional Screening:  Hepatitis C Screening: does not qualify; Completed 03/03/17  Vision Screening: Recommended annual ophthalmology exams for early detection of glaucoma and other disorders of the eye. Is the patient up to date with their annual eye exam?  No  Who is the provider or what is the name of the office in which the patient attends annual eye exams? [facility] If pt is not established with a provider, would they like to be referred to a provider to establish care?  N/A .   Dental Screening: Recommended annual dental exams for proper oral hygiene  Community Resource Referral / Chronic Care Management: CRR required this visit?  No   CCM required this visit?  No      Plan:     I have personally reviewed and noted the following in the patient's chart:   Medical and social history Use of alcohol, tobacco or illicit drugs  Current medications and supplements including opioid prescriptions. Patient is currently taking opioid prescriptions. Information provided to patient regarding non-opioid alternatives. Patient advised to discuss non-opioid treatment plan with their provider. Functional ability and status Nutritional status Physical activity Advanced directives List of other physicians Hospitalizations, surgeries, and ER visits in previous 12 months Vitals Screenings to include cognitive, depression, and falls Referrals and appointments  In addition, I have reviewed and discussed with patient certain  preventive protocols, quality metrics, and best practice recommendations. A written personalized care plan for preventive services as well as general preventive health recommendations were provided to patient.     JMichiel Cowboy RN   07/15/2022   Nurse Notes:  Jill Phillips, Thank you for taking time to come for your Medicare Wellness Visit. I appreciate your ongoing commitment to your health goals. Please review the following plan we discussed  and let me know if I can assist you in the future.   These are the goals we discussed:  Goals      Patient Stated     Increase the amount of water that I drink by starting with drinking 1 bottle of water per day and increase each day. Increase by getting involved with my apartment complex senior activities.     Stop or Cut Down Tobacco Use     Timeframe:  Long-Range Goal Priority:  High Start Date:      09/19/20                       Expected End Date:     03/29/21                   - change or avoid triggers like smoky places, drinking alcohol and other smokers - cut down number of cigarettes by one-half - use over-the-counter gum, patch or lozenges - use Quit Line 1-800-Quit Now    Why is this important?   To stop or cut down it is important to have support from a person or group of people who you can count on.  You will also need to think about the things that make you feel like smoking, then plan for how to handle them.      Track and Manage My Blood Pressure-Hypertension     Timeframe:  Long-Range Goal Priority:  High Start Date:       09/29/20                      Expected End Date:   03/29/21                     - check blood pressure 3 times per week - choose a place to take my blood pressure (home, clinic or office, retail store) - write blood pressure results in a log or diary    Why is this important?   You won't feel high blood pressure, but it can still hurt your blood vessels.  High blood pressure can cause heart or kidney  problems. It can also cause a stroke.  Making lifestyle changes like losing a little weight or eating less salt will help.  Checking your blood pressure at home and at different times of the day can help to control blood pressure.  If the doctor prescribes medicine remember to take it the way the doctor ordered.  Call the office if you cannot afford the medicine or if there are questions about it.          This is a list of the screening recommended for you and due dates:  Health Maintenance  Topic Date Due   Zoster (Shingles) Vaccine (1 of 2) Never done   Pneumonia Vaccine (2 - PPSV23 or PCV20) 01/27/2018   Eye exam for diabetics  12/10/2018   COVID-19 Vaccine (3 - Moderna risk series) 08/27/2020   Flu Shot  04/02/2022   Screening for Lung Cancer  06/23/2022   Hemoglobin A1C  07/14/2022   Mammogram  01/12/2023*   Colon Cancer Screening  01/12/2023*   Yearly kidney function blood test for diabetes  01/12/2023   Yearly kidney health urinalysis for diabetes  01/12/2023   Complete foot exam   06/12/2023   Tetanus Vaccine  07/15/2023   Medicare Annual Wellness Visit  07/16/2023   DEXA scan (bone density measurement)  Completed  Hepatitis C Screening: USPSTF Recommendation to screen - Ages 15-79 yo.  Completed   HPV Vaccine  Aged Out  *Topic was postponed. The date shown is not the original due date.

## 2022-07-15 ENCOUNTER — Encounter: Payer: Self-pay | Admitting: Internal Medicine

## 2022-07-15 ENCOUNTER — Ambulatory Visit (INDEPENDENT_AMBULATORY_CARE_PROVIDER_SITE_OTHER): Payer: Medicare Other | Admitting: *Deleted

## 2022-07-15 ENCOUNTER — Other Ambulatory Visit: Payer: Self-pay | Admitting: Internal Medicine

## 2022-07-15 DIAGNOSIS — Z0000 Encounter for general adult medical examination without abnormal findings: Secondary | ICD-10-CM | POA: Diagnosis not present

## 2022-07-15 DIAGNOSIS — Z1231 Encounter for screening mammogram for malignant neoplasm of breast: Secondary | ICD-10-CM | POA: Diagnosis not present

## 2022-07-15 NOTE — Patient Instructions (Addendum)
      Blood work was ordered.   The lab is on the first floor.    Medications changes include :       A referral was ordered for XXX.     Someone will call you to schedule an appointment.    Return in about 6 months (around 01/14/2023) for follow up.

## 2022-07-15 NOTE — Progress Notes (Signed)
Subjective:    Patient ID: Jill Phillips, female    DOB: [birth year], 66 y.o.   MRN: [id number]    HPI The patient is here for follow up of their chronic medical problems, including htn, DM, CKD, COPD, hypothyroid, gerd, anxiety, depression, RLS, insomnia   Consider changing inhaler-can try Breztri   Medications and allergies reviewed with patient and updated if appropriate.  Patient Active Problem List   Diagnosis Date Noted   Unilateral primary osteoarthritis, left knee 01/09/2022   Onychomycosis 10/04/2021   Diarrhea 10/04/2021   Itching 07/16/2021   AKI (acute kidney injury) ([place]) 02/04/2021   Overdose 02/04/2021   Acute gout 06/20/2020   Interstitial cystitis 01/21/2020   Status post shoulder surgery 09/09/2019   Nausea 08/19/2019   Anxiety 05/04/2019   Insomnia 05/04/2019   Syncope 12/08/2018   Disorder of rotator cuff syndrome of left shoulder and allied disorder 06/22/2018   Bilateral leg edema 06/04/2018   Type 2 diabetes mellitus without complication, without long-term current use of insulin ([place]) 12/02/2017   Sacral pain 09/17/2017   COPD (chronic obstructive pulmonary disease) ([place]) 04/21/2017   Lower back pain 03/03/2017   CKD (chronic kidney disease) stage 3, GFR 30-59 ml/min (HCC) 08/07/2016   GERD (gastroesophageal reflux disease) 08/07/2016   Chronic respiratory failure ([place]) 07/29/2016   Arthritis of carpometacarpal ([place]) joint of left thumb 07/09/2016   Pneumothorax on left 01/31/2016   Chronic, continuous use of opioids 09/06/2015   Degenerative disc disease, lumbar 08/01/2015   Spondylolisthesis of lumbar region 08/01/2015   Hypoxia 09/14/2014   Urinary, incontinence, stress female 05/06/2014   Constipation 05/05/2014   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/14/2013   HTN (hypertension) 05/19/2013   HLD (hyperlipidemia) 05/19/2013   Depression 05/19/2013   Hypothyroidism 05/19/2013   Tobacco abuse    Osteoarthritis of both knees 11/18/2011   RLS (restless  legs syndrome) 11/18/2011    Current Outpatient Medications on File Prior to Visit  Medication Sig Dispense Refill   acetaminophen (TYLENOL) 500 MG tablet Take 1,500 mg by mouth daily as needed for moderate pain or headache.     albuterol (PROVENTIL) (2.5 MG/3ML) 0.083% nebulizer solution Take 3 mLs (2.5 mg total) by nebulization every 4 (four) hours as needed for wheezing or shortness of breath. (Patient not taking: Reported on 09/18/2021) 75 mL 2   albuterol (VENTOLIN HFA) 108 (90 Base) MCG/ACT inhaler INHALE 2 PUFFS INTO THE LUNGS EVERY 6 HOURS AS NEEDED FOR WHEEZING OR SHORTNESS OF BREATH. 8.5 g 0   amLODipine-benazepril (LOTREL) 10-40 MG capsule TAKE 1 CAPSULE BY MOUTH DAILY. 90 capsule 0   atorvastatin (LIPITOR) 20 MG tablet TAKE 1 TABLET BY MOUTH DAILY. 90 tablet 1   clonazePAM (KLONOPIN) 1 MG tablet TAKE 1 TABLET BY MOUTH DAILY. 30 tablet 0   docusate sodium (COLACE) 100 MG capsule Take 200 mg by mouth at bedtime.     Efinaconazole 10 % SOLN Apply daily to affected nails for 48 weeks 8 mL 1   fentaNYL (DURAGESIC) 12 MCG/HR Place 1 patch onto the skin every 3 (three) days.     gabapentin (NEURONTIN) 400 MG capsule TAKE 1 CAPSULE BY MOUTH 3 TIMES DAILY. 270 capsule 0   hydrochlorothiazide (HYDRODIURIL) 12.5 MG tablet TAKE 1 TABLET (12.5 MG TOTAL) BY MOUTH DAILY. 30 tablet 3   HYDROcodone-acetaminophen (NORCO) 10-325 MG tablet Take 1 tablet by mouth every 6 (six) hours as needed for severe pain (Chronic back pain). (Patient taking differently: Take 1-4 tablets by  mouth 3 (three) times daily.) 84 tablet 0   levothyroxine (SYNTHROID) 88 MCG tablet TAKE 1 TABLET BY MOUTH DAILY. 90 tablet 1   Melatonin 10 MG TABS Take 30 mg by mouth at bedtime.     NARCAN 4 MG/0.1ML LIQD nasal spray kit Place 1 spray into the nose as needed (accidental overdose). 1 each 0   Nebivolol HCl 20 MG TABS TAKE 1 TABLET (20 MG TOTAL) BY MOUTH DAILY. 90 tablet 0   nicotine (NICODERM CQ - DOSED IN MG/24 HOURS) 21 mg/24hr  patch Place 1 patch (21 mg total) onto the skin daily. 28 patch 1   omeprazole (PRILOSEC) 40 MG capsule TAKE 1 CAPSULE BY MOUTH 2 TIMES DAILY BEFORE A MEAL. 180 capsule 3   promethazine (PHENERGAN) 50 MG tablet Take 1 tablet (50 mg total) by mouth every 8 (eight) hours as needed for nausea or vomiting. 15 tablet 0   QUEtiapine (SEROQUEL) 200 MG tablet Take 1 tablet (200 mg total) by mouth at bedtime. 30 tablet 3   rOPINIRole (REQUIP) 1 MG tablet TAKE 1 TO 2 TABLETS BY MOUTH TWO TIMES DAILY AS NEEDED (Patient not taking: Reported on 07/15/2022) 120 tablet 2   solifenacin (VESICARE) 5 MG tablet Take 5 mg by mouth 2 (two) times daily.     SPIRIVA HANDIHALER 18 MCG inhalation capsule PLACE 1 CAPSULE INTO INHALER AND INHALE DAILY. 30 capsule 12   tiZANidine (ZANAFLEX) 4 MG tablet TAKE 1 TABLET BY MOUTH EVERY 8 HOURS AS NEEDED 90 tablet 2   valACYclovir (VALTREX) 500 MG tablet TAKE 1 TABLET BY MOUTH DAILY. 90 tablet 1   venlafaxine XR (EFFEXOR-XR) 150 MG 24 hr capsule Plan TAKE 2 CAPSULES BY MOUTH DAILY. 180 capsule 1   No current facility-administered medications on file prior to visit.    Past Medical History:  Diagnosis Date   Active smoker    Anxiety    Calcifying tendinitis of shoulder    Chronic back pain    Chronic pain syndrome    COPD (chronic obstructive pulmonary disease) (HCC)    Dysthymic disorder    Emphysema lung (HCC)    GERD (gastroesophageal reflux disease)    Headache    "weekly maybe" (01/31/2016)   Heart murmur    for years, nothing to be concerned about   Herpes genitalia    History of blood transfusion 1980   related to "back surgery"   Hyperlipidemia    Hypertension    Hypothyroidism    Lumbago    Osteoarthrosis, unspecified whether generalized or localized, lower leg    Pain in joint, upper arm    Pneumothorax, left 01/31/2016   S/P Left posterior subcostal pain injection on 01/30/2016   PONV (postoperative nausea and vomiting)    gets nauseous  with longer  surgery. Difficuty voiding after surgery   Postlaminectomy syndrome, thoracic region    Primary localized osteoarthrosis, lower leg    Restless leg syndrome    Sleep apnea    s/p surgery- last sleep study 2011- doesnt use oxygen or machine at night as instructed,.   12/2014- Dr Sood  reports it is negative.   Thyroid disease     Past Surgical History:  Procedure Laterality Date   APPENDECTOMY     BACK SURGERY     18 back surgeries (2 thoracic & 16 lumbar) (01/31/2016)   DILATION AND CURETTAGE OF UTERUS     HAMMER TOE SURGERY     IR RADIOLOGIST EVAL & MGMT  07/21/2017     JOINT REPLACEMENT     KNEE ARTHROSCOPY Right    LAPAROSCOPIC CHOLECYSTECTOMY     LUMBAR FUSION N/A 08/01/2015   Procedure: Right sided L1-2 and L2-3 transforaminal lumbar interbody fusion with cages, Extension of posterior fusion T12 to L3, Replaced pedicle screws bilaterally L1-L2 , Replaced left sided pedicle screws L-3. Instrumentation T12 to L3 using local bone graft, Vivigen allograft and cancellous chips;  Surgeon: James E Nitka, MD;  Location: [facility];  Service: Orthopedics;  Laterality: N/A;   LUMBAR LAMINECTOMY/DECOMPRESSION MICRODISCECTOMY N/A 01/28/2014   Procedure: Minimally Invasive Right  L1-2 Microdiscectomy;  Surgeon: James E Nitka, MD;  Location: [facility];  Service: Orthopedics;  Laterality: N/A;   REVERSE SHOULDER ARTHROPLASTY Left 09/09/2019   Procedure: LEFT REVERSE SHOULDER REPLACEMENT;  Surgeon: Dean, Gregory Scott, MD;  Location: [facility];  Service: Orthopedics;  Laterality: Left;   TOTAL HIP ARTHROPLASTY Right    TOTAL KNEE ARTHROPLASTY  05/29/2012   Procedure: TOTAL KNEE ARTHROPLASTY;  Surgeon: Christopher Y Blackman, MD;  Location: [facility];  Service: Orthopedics;  Laterality: Right;  Right Total Knee Arthroplasty   TUBAL LIGATION     UVULOPALATOPHARYNGOPLASTY     VAGINAL HYSTERECTOMY      Social History   Socioeconomic History   Marital status: Divorced    Spouse name: n/a   Number of children: 2    Years of education: 12+   Highest education level: Not on file  Occupational History   Occupation: disability    Comment: back surgeries  Tobacco Use   Smoking status: Every Day    Packs/day: 1.25    Years: 45.00    Total pack years: 56.25    Types: Cigarettes   Smokeless tobacco: Never   Tobacco comments:    Uses Nicoderm patch discussed 1-800-quit-now  Vaping Use   Vaping Use: Never used  Substance and Sexual Activity   Alcohol use: No    Alcohol/week: 0.0 standard drinks of alcohol   Drug use: No    Types: Marijuana    Comment: 01/31/2016 "none since ~ 1980"   Sexual activity: Never    Partners: Male  Other Topics Concern   Not on file  Social History Narrative   Lives alone.  One daughter is local, but is getting ready to move to [place], where her children live with their father.  The other daughter lives near [place], [place].   Social Determinants of Health   Financial Resource Strain: Low Risk  (07/15/2022)   Overall Financial Resource Strain (CARDIA)    Difficulty of Paying Living Expenses: Not very hard  Food Insecurity: No Food Insecurity (07/15/2022)   Hunger Vital Sign    Worried About Running Out of Food in the Last Year: Never true    Ran Out of Food in the Last Year: Never true  Transportation Needs: No Transportation Needs (07/15/2022)   PRAPARE - Transportation    Lack of Transportation (Medical): No    Lack of Transportation (Non-Medical): No  Physical Activity: Inactive (07/15/2022)   Exercise Vital Sign    Days of Exercise per Week: 0 days    Minutes of Exercise per Session: 0 min  Stress: No Stress Concern Present (07/15/2022)   [employer] - Occupational Stress Questionnaire    Feeling of Stress : Only a little  Social Connections: Socially Isolated (07/15/2022)   Social Connection and Isolation Panel [NHANES]    Frequency of Communication with Friends and Family: More than three times a week      Frequency of Social  Gatherings with Friends and Family: More than three times a week    Attends Religious Services: Never    Active Member of Clubs or Organizations: No    Attends Club or Organization Meetings: Never    Marital Status: Divorced    Family History  Problem Relation Age of Onset   Kidney disease Mother    Heart disease Father    Anuerysm Brother 29       brain   Heart disease Brother    Heart disease Sister 45       s/p CABG   Hypertension Sister    Colon cancer Neg Hx     Review of Systems     Objective:  There were no vitals filed for this visit. BP Readings from Last 3 Encounters:  05/08/22 (!) 142/80  04/04/22 109/68  03/06/22 134/78   Wt Readings from Last 3 Encounters:  05/08/22 151 lb (68.5 kg)  03/06/22 151 lb (68.5 kg)  01/11/22 150 lb 9.6 oz (68.3 kg)   There is no height or weight on file to calculate BMI.   Physical Exam    Constitutional: Appears well-developed and well-nourished. No distress.  HENT:  Head: Normocephalic and atraumatic.  Neck: Neck supple. No tracheal deviation present. No thyromegaly present.  No cervical lymphadenopathy Cardiovascular: Normal rate, regular rhythm and normal heart sounds.   No murmur heard. No carotid bruit .  No edema Pulmonary/Chest: Effort normal and breath sounds normal. No respiratory distress. No has no wheezes. No rales.  Skin: Skin is warm and dry. Not diaphoretic.  Psychiatric: Normal mood and affect. Behavior is normal.      Assessment & Plan:    See Problem List for Assessment and Plan of chronic medical problems.    This encounter was created in error - please disregard. 

## 2022-07-16 ENCOUNTER — Encounter: Payer: Medicare Other | Admitting: Internal Medicine

## 2022-07-16 DIAGNOSIS — N1832 Chronic kidney disease, stage 3b: Secondary | ICD-10-CM

## 2022-07-16 DIAGNOSIS — E038 Other specified hypothyroidism: Secondary | ICD-10-CM

## 2022-07-16 DIAGNOSIS — F3289 Other specified depressive episodes: Secondary | ICD-10-CM

## 2022-07-16 DIAGNOSIS — B009 Herpesviral infection, unspecified: Secondary | ICD-10-CM

## 2022-07-16 DIAGNOSIS — K219 Gastro-esophageal reflux disease without esophagitis: Secondary | ICD-10-CM

## 2022-07-16 DIAGNOSIS — E119 Type 2 diabetes mellitus without complications: Secondary | ICD-10-CM

## 2022-07-16 DIAGNOSIS — G4709 Other insomnia: Secondary | ICD-10-CM

## 2022-07-16 DIAGNOSIS — J439 Emphysema, unspecified: Secondary | ICD-10-CM

## 2022-07-16 DIAGNOSIS — F419 Anxiety disorder, unspecified: Secondary | ICD-10-CM

## 2022-07-16 DIAGNOSIS — I10 Essential (primary) hypertension: Secondary | ICD-10-CM

## 2022-07-16 NOTE — Assessment & Plan Note (Signed)
Chronic  Clinically euthyroid Check tsh and will titrate med dose if needed Currently taking levothyroxine 88 mcg daily 

## 2022-07-16 NOTE — Assessment & Plan Note (Signed)
Chronic Blood pressure well controlled CMP Continue amlodipine-benazepril 10-40 mg daily, HCTZ 12.5 mg daily, nebivolol 20 mg daily

## 2022-07-16 NOTE — Assessment & Plan Note (Signed)
Chronic Stressed smoking cessation Has chronic shortness of breath, occasional cough and wheeze Discussed possibly changing inhaler to see if it helps more

## 2022-07-16 NOTE — Assessment & Plan Note (Signed)
Chronic back pain History of multiple surgeries Follows with orthopedics, pain management Norco pill-prescribed by orthopedics/pain management Continue gabapentin 400 mg 3 times daily On tizanidine

## 2022-07-16 NOTE — Assessment & Plan Note (Signed)
Chronic Controlled Continue Valtrex 500 mg daily 

## 2022-07-16 NOTE — Assessment & Plan Note (Signed)
Chronic   Lab Results  Component Value Date   HGBA1C 6.3 01/11/2022   Sugars controlled Check A1c Continue diet control Stressed regular exercise, diabetic diet

## 2022-07-16 NOTE — Assessment & Plan Note (Signed)
Chronic Controlled, Stable Continue clonazepam 1 mg daily, Effexor 300 mg daily

## 2022-07-16 NOTE — Assessment & Plan Note (Signed)
Chronic Controlled, Stable Continue Effexor 300 mg daily

## 2022-07-16 NOTE — Assessment & Plan Note (Signed)
Chronic GERD controlled Continue omeprazole 40 mg twice daily

## 2022-07-16 NOTE — Assessment & Plan Note (Signed)
Chronic Controlled, Stable Continue Seroquel 200 mg daily

## 2022-07-16 NOTE — Assessment & Plan Note (Signed)
Chronic GFR improved after stopping diclofenac Also improved after stopping HCTZ, but swelling increased and we had to restart at low-dose CMP, CBC today

## 2022-07-18 DIAGNOSIS — Z79899 Other long term (current) drug therapy: Secondary | ICD-10-CM | POA: Diagnosis not present

## 2022-07-19 ENCOUNTER — Other Ambulatory Visit: Payer: Self-pay | Admitting: Internal Medicine

## 2022-07-22 NOTE — Progress Notes (Signed)
Subjective:    Patient ID: Jill Phillips, female    DOB: 06-Mar-1956, 66 y.o.   MRN: [id number]    HPI The patient is here for follow up of their chronic medical problems, including htn, DM, CKD, COPD, hypothyroid, gerd, anxiety, depression, RLS, insomnia   Consider changing inhaler-can try Breztri   Medications and allergies reviewed with patient and updated if appropriate.  Patient Active Problem List   Diagnosis Date Noted  . Chest pain 07/30/2022  . Unilateral primary osteoarthritis, left knee 01/09/2022  . Onychomycosis 10/04/2021  . Diarrhea 10/04/2021  . Itching 07/16/2021  . AKI (acute kidney injury) ([place]) 02/04/2021  . Overdose 02/04/2021  . Acute gout 06/20/2020  . Interstitial cystitis 01/21/2020  . Status post shoulder surgery 09/09/2019  . Nausea 08/19/2019  . Anxiety 05/04/2019  . Insomnia 05/04/2019  . Syncope 12/08/2018  . Disorder of rotator cuff syndrome of left shoulder and allied disorder 06/22/2018  . Bilateral leg edema 06/04/2018  . Type 2 diabetes mellitus without complication, without long-term current use of insulin ([place]) 12/02/2017  . Sacral pain 09/17/2017  . COPD (chronic obstructive pulmonary disease) ([place]) 04/21/2017  . Lower back pain 03/03/2017  . CKD (chronic kidney disease) stage 3, GFR 30-59 ml/min (HCC) 08/07/2016  . GERD (gastroesophageal reflux disease) 08/07/2016  . Chronic respiratory failure ([place]) 07/29/2016  . Arthritis of carpometacarpal [facility]) joint of left thumb 07/09/2016  . Pneumothorax on left 01/31/2016  . Chronic, continuous use of opioids 09/06/2015  . Degenerative disc disease, lumbar 08/01/2015  . Spondylolisthesis of lumbar region 08/01/2015  . Hypoxia 09/14/2014  . Urinary, incontinence, stress female 05/06/2014  . Constipation 05/05/2014  . 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 HTN (hypertension) 05/19/2013  . HLD (hyperlipidemia) 05/19/2013  . Depression 05/19/2013  . Hypothyroidism 05/19/2013  . Tobacco abuse    . Osteoarthritis of both knees 11/18/2011  . RLS (restless legs syndrome) 11/18/2011    Current Outpatient Medications on File Prior to Visit  Medication Sig Dispense Refill  . acetaminophen (TYLENOL) 500 MG tablet Take 1,500 mg by mouth daily as needed for moderate pain or headache.    . albuterol (VENTOLIN HFA) 108 (90 Base) MCG/ACT inhaler INHALE 2 PUFFS INTO THE LUNGS EVERY 6 HOURS AS NEEDED FOR WHEEZING OR SHORTNESS OF BREATH. 8.5 g 0  . amLODipine-benazepril (LOTREL) 10-40 MG capsule TAKE 1 CAPSULE BY MOUTH DAILY. 90 capsule 0  . atorvastatin (LIPITOR) 20 MG tablet TAKE 1 TABLET BY MOUTH DAILY. 90 tablet 1  . clonazePAM (KLONOPIN) 1 MG tablet TAKE 1 TABLET BY MOUTH DAILY. 30 tablet 0  . docusate sodium (COLACE) 100 MG capsule Take 200 mg by mouth at bedtime.    . Efinaconazole 10 % SOLN Apply daily to affected nails for 48 weeks 8 mL 1  . gabapentin (NEURONTIN) 400 MG capsule TAKE 1 CAPSULE BY MOUTH 3 TIMES DAILY. 270 capsule 0  . hydrochlorothiazide (HYDRODIURIL) 12.5 MG tablet TAKE 1 TABLET (12.5 MG TOTAL) BY MOUTH DAILY. 30 tablet 3  . HYDROcodone-acetaminophen (NORCO) 10-325 MG tablet Take 1 tablet by mouth every 6 (six) hours as needed for severe pain (Chronic back pain). (Patient taking differently: Take 1-4 tablets by mouth 3 (three) times daily.) 84 tablet 0  . levothyroxine (SYNTHROID) 88 MCG tablet TAKE 1 TABLET BY MOUTH DAILY. 90 tablet 1  . Melatonin 10 MG TABS Take 30 mg by mouth at bedtime.    [name] NARCAN 4 MG/0.1ML LIQD nasal spray kit Place 1 spray  into the nose as needed (accidental overdose). 1 each 0  . Nebivolol HCl 20 MG TABS TAKE 1 TABLET (20 MG TOTAL) BY MOUTH DAILY. 90 tablet 0  . nicotine (NICODERM CQ - DOSED IN MG/24 HOURS) 21 mg/24hr patch Place 1 patch (21 mg total) onto the skin daily. 28 patch 1  . omeprazole (PRILOSEC) 40 MG capsule TAKE 1 CAPSULE BY MOUTH 2 TIMES DAILY BEFORE A MEAL. 180 capsule 3  . promethazine (PHENERGAN) 50 MG tablet Take 1 tablet (50 mg  total) by mouth every 8 (eight) hours as needed for nausea or vomiting. 15 tablet 0  . QUEtiapine (SEROQUEL) 200 MG tablet Take 1 tablet (200 mg total) by mouth at bedtime. 30 tablet 3  . solifenacin (VESICARE) 5 MG tablet Take 5 mg by mouth 2 (two) times daily.    [name] SPIRIVA HANDIHALER 18 MCG inhalation capsule PLACE 1 CAPSULE INTO INHALER AND INHALE DAILY. 30 capsule 12  . tiZANidine (ZANAFLEX) 4 MG tablet TAKE 1 TABLET BY MOUTH EVERY 8 HOURS AS NEEDED 90 tablet 2  . valACYclovir (VALTREX) 500 MG tablet TAKE 1 TABLET BY MOUTH DAILY. 90 tablet 1  . venlafaxine XR (EFFEXOR-XR) 150 MG 24 hr capsule Plan TAKE 2 CAPSULES BY MOUTH DAILY. 180 capsule 1   No current facility-administered medications on file prior to visit.    Past Medical History:  Diagnosis Date  . Active smoker   . Anxiety   . Calcifying tendinitis of shoulder   . Chronic back pain   . Chronic pain syndrome   . COPD (chronic obstructive pulmonary disease) ([place])   . Dysthymic disorder   . Emphysema lung ([place])   . GERD (gastroesophageal reflux disease)   . Headache    "weekly maybe" (01/31/2016)  . Heart murmur    for years, nothing to be concerned about  . Herpes genitalia   . History of blood transfusion 1980   related to "back surgery"  . Hyperlipidemia   . Hypertension   . Hypothyroidism   . Lumbago   . Osteoarthrosis, unspecified whether generalized or localized, lower leg   . Pain in joint, upper arm   . Pneumothorax, left 01/31/2016   S/P Left posterior subcostal pain injection on 01/30/2016  . PONV (postoperative nausea and vomiting)    gets nauseous  with longer surgery. Difficuty voiding after surgery  . Postlaminectomy syndrome, thoracic region   . Primary localized osteoarthrosis, lower leg   . Restless leg syndrome   . Sleep apnea    s/p surgery- last sleep study 2011- doesnt use oxygen or machine at night as instructed,.   12/2014- Dr Halford Chessman  reports it is negative.  . Thyroid disease     Past Surgical  History:  Procedure Laterality Date  . APPENDECTOMY    . BACK SURGERY     18 back surgeries (2 thoracic & 16 lumbar) (01/31/2016)  . DILATION AND CURETTAGE OF UTERUS    . HAMMER TOE SURGERY    . IR RADIOLOGIST EVAL & MGMT  07/21/2017  . JOINT REPLACEMENT    . KNEE ARTHROSCOPY Right   . LAPAROSCOPIC CHOLECYSTECTOMY    . LUMBAR FUSION N/A 08/01/2015   Procedure: Right sided L1-2 and L2-3 transforaminal lumbar interbody fusion with cages, Extension of posterior fusion T12 to L3, Replaced pedicle screws bilaterally L1-L2 , Replaced left sided pedicle screws L-3. Instrumentation T12 to L3 using local bone graft, Vivigen allograft and cancellous chips;  Surgeon: [name], MD;  Location: [facility];  Service: Orthopedics;  Laterality: N/A;  . LUMBAR LAMINECTOMY/DECOMPRESSION MICRODISCECTOMY N/A 01/28/2014   Procedure: Minimally Invasive Right  L1-2 Microdiscectomy;  Surgeon: [name], MD;  Location: [facility];  Service: Orthopedics;  Laterality: N/A;  . REVERSE SHOULDER ARTHROPLASTY Left 09/09/2019   Procedure: LEFT REVERSE SHOULDER REPLACEMENT;  Surgeon: Meredith Pel, MD;  Location: [facility];  Service: Orthopedics;  Laterality: Left;  . TOTAL HIP ARTHROPLASTY Right   . TOTAL KNEE ARTHROPLASTY  05/29/2012   Procedure: TOTAL KNEE ARTHROPLASTY;  Surgeon: Mcarthur Rossetti, MD;  Location: [facility];  Service: Orthopedics;  Laterality: Right;  Right Total Knee Arthroplasty  . TUBAL LIGATION    . UVULOPALATOPHARYNGOPLASTY    . VAGINAL HYSTERECTOMY      Social History   Socioeconomic History  . Marital status: Divorced    Spouse name: n/a  . Number of children: 2  . Years of education: 12+  . Highest education level: Not on file  Occupational History  . Occupation: disability    Comment: back surgeries  Tobacco Use  . Smoking status: Every Day    Packs/day: 1.25    Years: 45.00    Total pack years: 56.25    Types: Cigarettes  . Smokeless tobacco: Never  . Tobacco comments:    Uses  Nicoderm patch discussed 1-800-quit-now  Vaping Use  . Vaping Use: Never used  Substance and Sexual Activity  . Alcohol use: No    Alcohol/week: 0.0 standard drinks of alcohol  . Drug use: No    Types: Marijuana    Comment: 01/31/2016 "none since ~ 1980"  . Sexual activity: Never    Partners: Male  Other Topics Concern  . Not on file  Social History Narrative   Lives alone.  One daughter is local, but is getting ready to move to [place], where her children live with their father.  The other daughter lives near [place], [place].   Social Determinants of Health   Financial Resource Strain: Low Risk  (07/15/2022)   Overall Financial Resource Strain (CARDIA)   . Difficulty of Paying Living Expenses: Not very hard  Food Insecurity: No Food Insecurity (07/15/2022)   Hunger Vital Sign   . Worried About Charity fundraiser in the Last Year: Never true   . Ran Out of Food in the Last Year: Never true  Transportation Needs: No Transportation Needs (07/15/2022)   PRAPARE - Transportation   . Lack of Transportation (Medical): No   . Lack of Transportation (Non-Medical): No  Physical Activity: Inactive (07/15/2022)   Exercise Vital Sign   . Days of Exercise per Week: 0 days   . Minutes of Exercise per Session: 0 min  Stress: No Stress Concern Present (07/15/2022)   [place]   . Feeling of Stress : Only a little  Social Connections: Socially Isolated (07/15/2022)   Social Connection and Isolation Panel [NHANES]   . Frequency of Communication with Friends and Family: More than three times a week   . Frequency of Social Gatherings with Friends and Family: More than three times a week   . Attends Religious Services: Never   . Active Member of Clubs or Organizations: No   . Attends Archivist Meetings: Never   . Marital Status: Divorced    Family History  Problem Relation Age of Onset  . Kidney disease Mother    . Heart disease Father   . Anuerysm Brother 28  brain  . Heart disease Brother   . Heart disease Sister 53       s/p CABG  . Hypertension Sister   . Colon cancer Neg Hx     Review of Systems     Objective:  There were no vitals filed for this visit. BP Readings from Last 3 Encounters:  07/30/22 (!) 140/72  05/08/22 (!) 142/80  04/04/22 109/68   Wt Readings from Last 3 Encounters:  07/30/22 152 lb (68.9 kg)  05/08/22 151 lb (68.5 kg)  03/06/22 151 lb (68.5 kg)   There is no height or weight on file to calculate BMI.   Physical Exam    Constitutional: Appears well-developed and well-nourished. No distress.  HENT:  Head: Normocephalic and atraumatic.  Neck: Neck supple. No tracheal deviation present. No thyromegaly present.  No cervical lymphadenopathy Cardiovascular: Normal rate, regular rhythm and normal heart sounds.   No murmur heard. No carotid bruit .  No edema Pulmonary/Chest: Effort normal and breath sounds normal. No respiratory distress. No has no wheezes. No rales.  Skin: Skin is warm and dry. Not diaphoretic.  Psychiatric: Normal mood and affect. Behavior is normal.      Assessment & Plan:    See Problem List for Assessment and Plan of chronic medical problems.     This encounter was created in error - please disregard.

## 2022-07-22 NOTE — Patient Instructions (Signed)
      Blood work was ordered.   The lab is on the first floor.    Medications changes include :       A referral was ordered for XXX.     Someone will call you to schedule an appointment.    Return in about 6 months (around 01/21/2023) for follow up.

## 2022-07-23 ENCOUNTER — Encounter: Payer: Medicare Other | Admitting: Internal Medicine

## 2022-07-23 ENCOUNTER — Encounter: Payer: Self-pay | Admitting: Internal Medicine

## 2022-07-23 DIAGNOSIS — N1832 Chronic kidney disease, stage 3b: Secondary | ICD-10-CM

## 2022-07-23 DIAGNOSIS — F419 Anxiety disorder, unspecified: Secondary | ICD-10-CM

## 2022-07-23 DIAGNOSIS — F3289 Other specified depressive episodes: Secondary | ICD-10-CM

## 2022-07-23 DIAGNOSIS — E119 Type 2 diabetes mellitus without complications: Secondary | ICD-10-CM

## 2022-07-23 DIAGNOSIS — I10 Essential (primary) hypertension: Secondary | ICD-10-CM

## 2022-07-23 DIAGNOSIS — E038 Other specified hypothyroidism: Secondary | ICD-10-CM

## 2022-07-23 DIAGNOSIS — J439 Emphysema, unspecified: Secondary | ICD-10-CM

## 2022-07-23 DIAGNOSIS — G4709 Other insomnia: Secondary | ICD-10-CM

## 2022-07-23 DIAGNOSIS — G2581 Restless legs syndrome: Secondary | ICD-10-CM

## 2022-07-23 DIAGNOSIS — K219 Gastro-esophageal reflux disease without esophagitis: Secondary | ICD-10-CM

## 2022-07-23 IMAGING — CT CT ANGIO CHEST
2 of 7 series · 17 of 46 positions shown · IV contrast (APPLIED)
Comparison: November 23, 2014

CLINICAL DATA: PE suspected, low/intermediate prob, positive
D-dimer

EXAM:
CT ANGIOGRAPHY CHEST WITH CONTRAST
TECHNIQUE: Multidetector CT imaging of the chest was performed using the
standard protocol during bolus administration of intravenous
contrast. Multiplanar CT image reconstructions and MIPs were
obtained to evaluate the vascular anatomy.
CONTRAST:  80mL OMNIPAQUE IOHEXOL 350 MG/ML SOLN

[Series 7: thins · axial · 0.74mm/px · z∈[-318,-55]mm · 14 of 423 slices shown]
[im 24/423  lung]
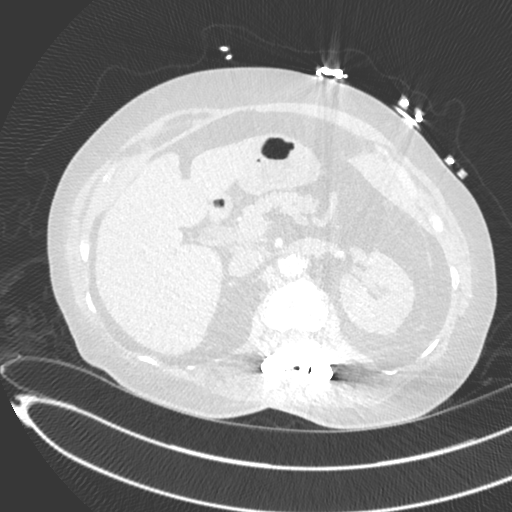
[im 47/423  soft-tissue]
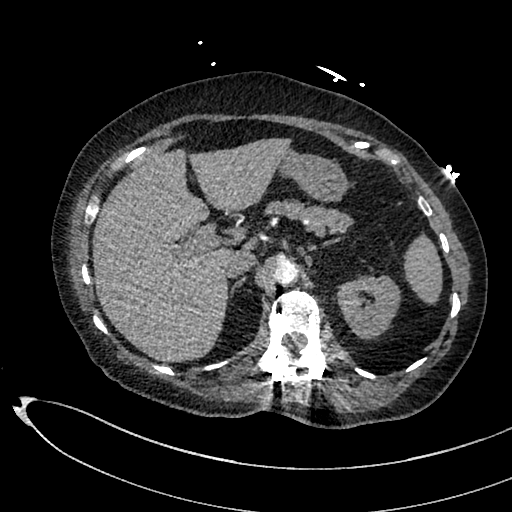
[im 94/423  lung]
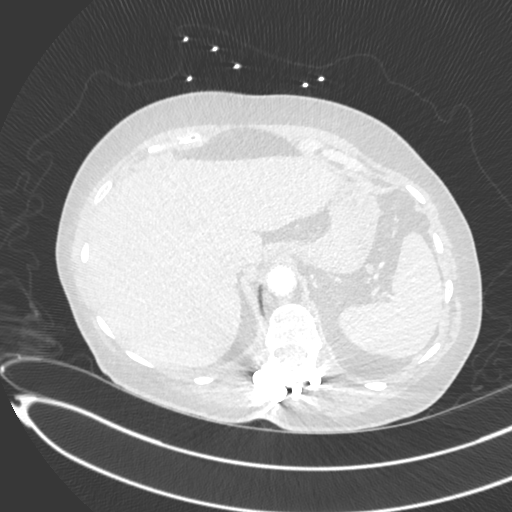
[im 118/423  soft-tissue]
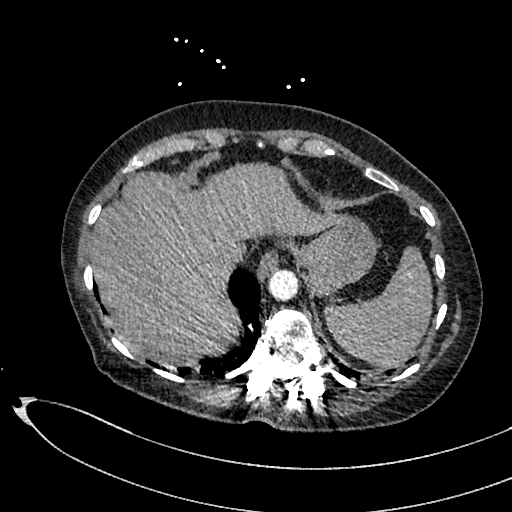
[im 141/423  lung]
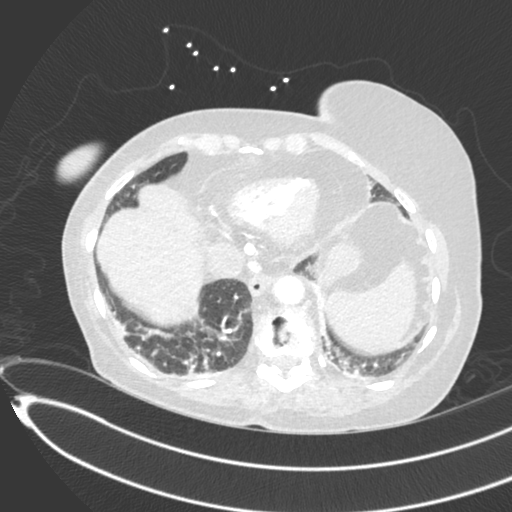
[im 165/423  soft-tissue]
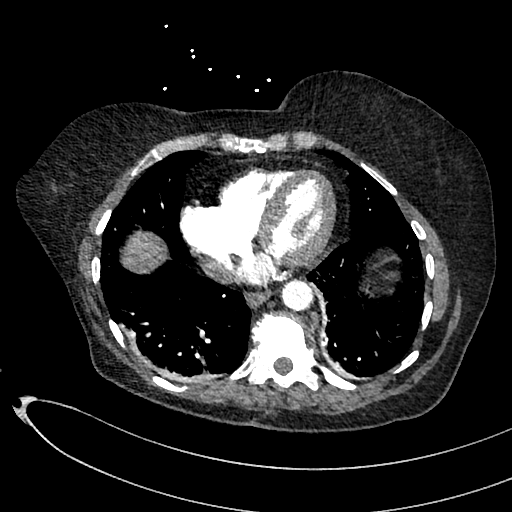
[im 188/423  lung]
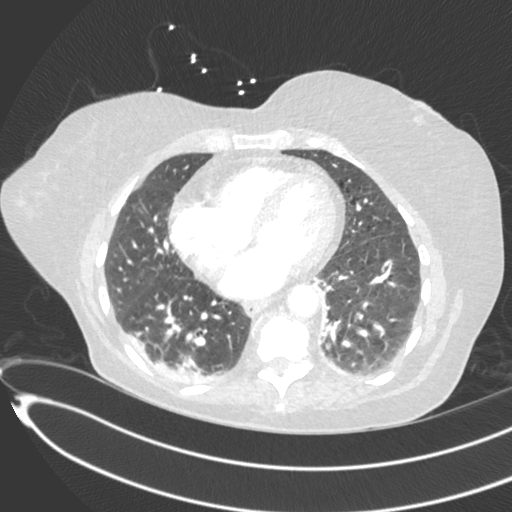
[im 235/423  soft-tissue]
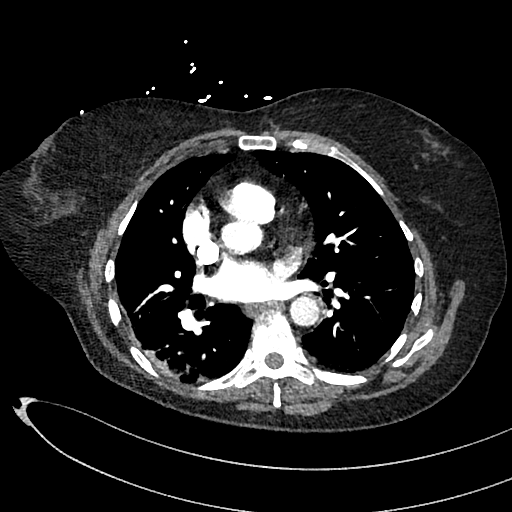
[im 258/423  lung]
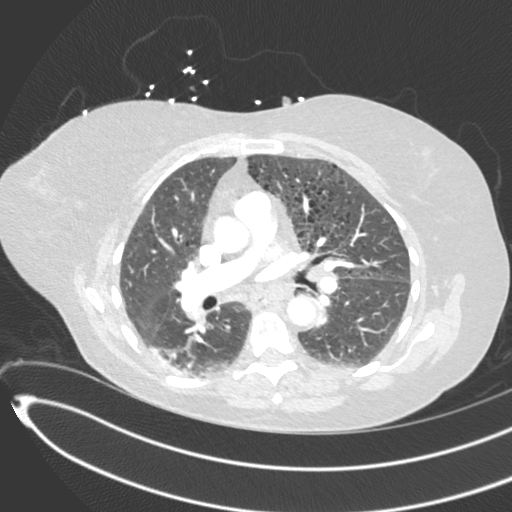
[im 282/423  soft-tissue]
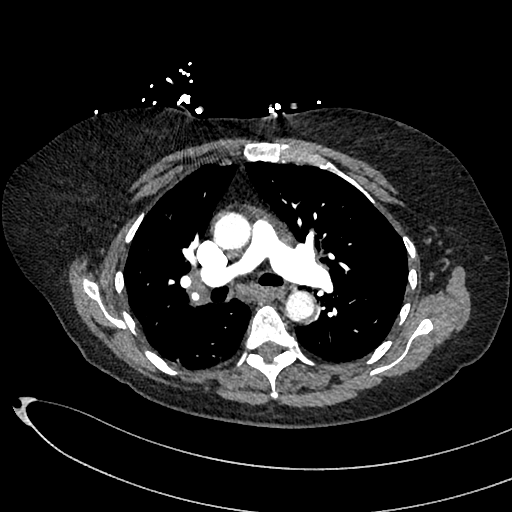
[im 305/423  lung]
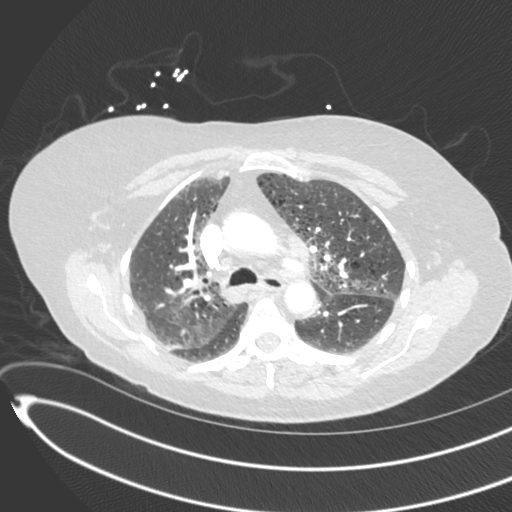
[im 329/423  soft-tissue]
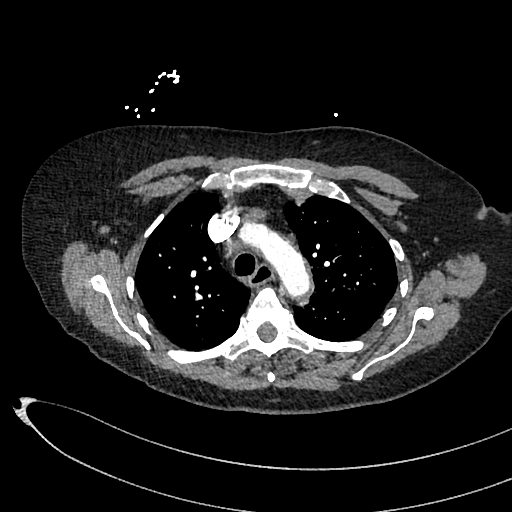
[im 376/423  lung]
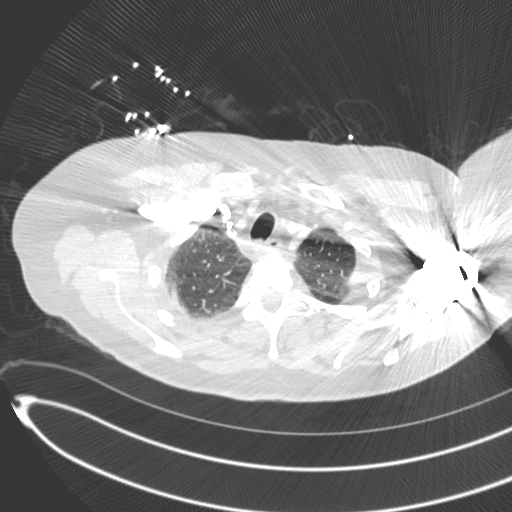
[im 399/423  soft-tissue]
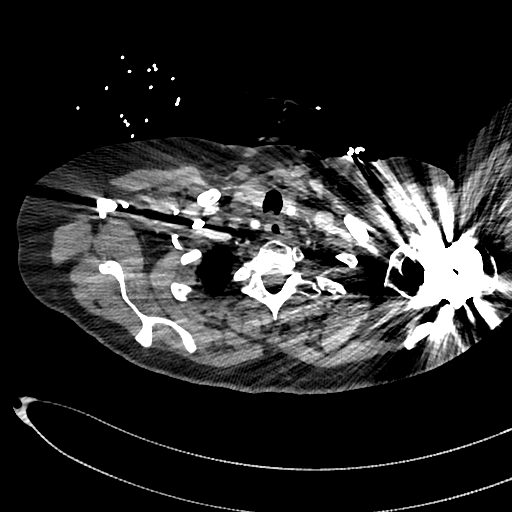

[Series 8: cor · coronal · 0.58mm/px · 3 of 146 slices shown]
[im 37/146  soft-tissue]
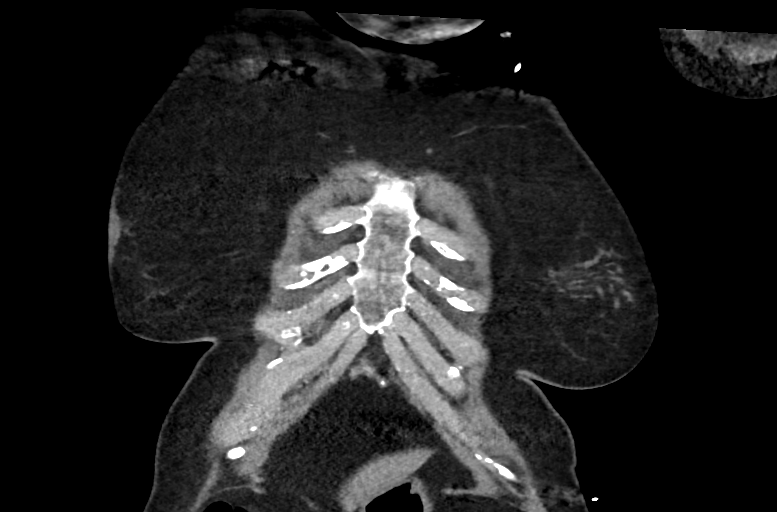
[im 73/146  soft-tissue]
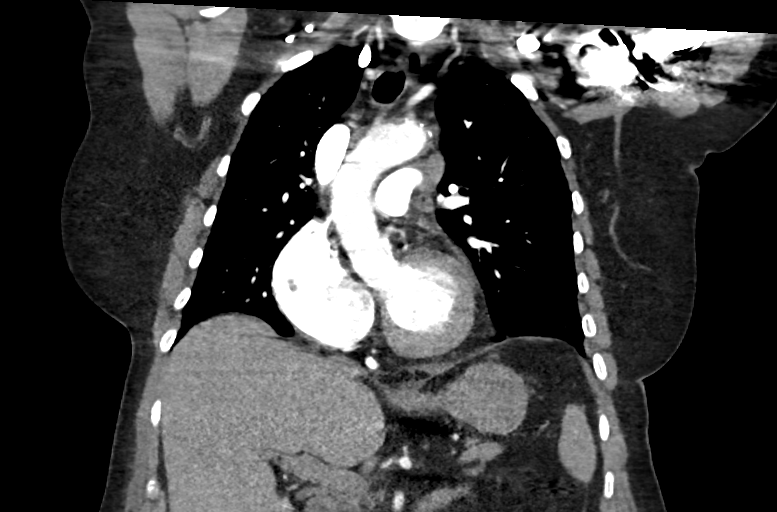
[im 109/146  soft-tissue]
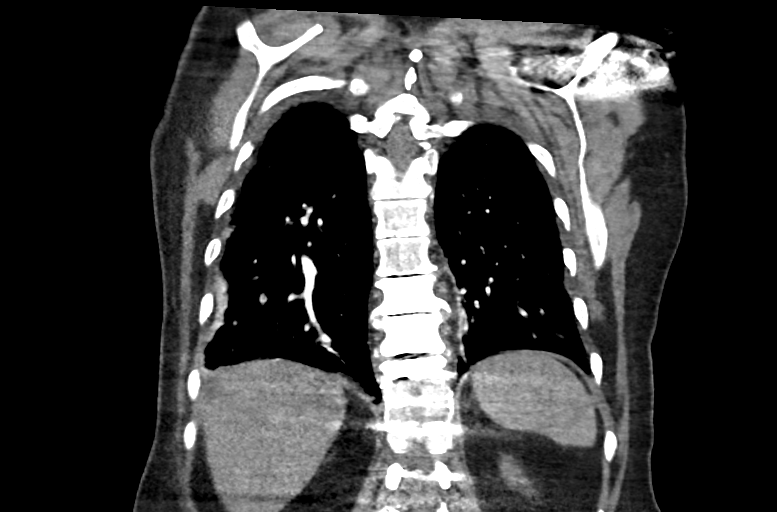

[17 of 46 positions shown; findings below may reference images not displayed]

FINDINGS: Evaluation is limited secondary to positioning and streak artifact.

Cardiovascular: Satisfactory opacification of the pulmonary arteries
to the segmental level. No evidence of pulmonary embolism. Heart is
the upper limits of normal in size. LEFT-sided coronary artery
atherosclerotic calcifications. Mild atherosclerotic calcifications
of the aorta. No pericardial effusion.

Mediastinum/Nodes: Visualized thyroid is unremarkable. There is a
prominent preaortic lymph node which measures 10 mm in the short
axis, previously 8 mm (series 7, image 120). No axillary adenopathy.

Lungs/Pleura: No pleural effusion or pneumothorax. Bilateral
dependent consolidative enhancing opacities most consistent with
atelectasis. Mild to moderate centrilobular emphysema. Mild
bronchial wall thickening.

Upper Abdomen: No acute abnormality.

Musculoskeletal: Status post LEFT shoulder arthroplasty. Status post
posterior fixation the thoracolumbar spine.

Review of the MIP images confirms the above findings.
IMPRESSION: 1. No acute pulmonary embolism.
2. Mild to moderate centrilobular emphysema with mild bronchial wall
thickening which could reflect bronchitis. Given underlying
emphysema, recommend evaluation for candidacy for annual lung cancer
screening CT.
3. Mildly prominent lymph node of the AP window, nonspecific and
likely reactive.

Aortic Atherosclerosis (YU1L1-7R5.5) and Emphysema (YU1L1-RAG.Q).

## 2022-07-23 NOTE — Assessment & Plan Note (Addendum)
Chronic Controlled, Stable Continue clonazepam 1 mg daily, Effexor 300 mg daily

## 2022-07-23 NOTE — Assessment & Plan Note (Signed)
Chronic Blood pressure well controlled CMP Continue amlodipine-benazepril 10-40 mg daily, hydrochlorothiazide 12.5 mg daily, nebivolol 20 mg daily

## 2022-07-23 NOTE — Assessment & Plan Note (Signed)
Chronic   Lab Results  Component Value Date   HGBA1C 6.3 01/11/2022   Sugars well controlled Check A1c Continue lifestyle control Stressed regular exercise, diabetic diet

## 2022-07-23 NOTE — Assessment & Plan Note (Signed)
Chronic GERD controlled Continue omeprazole 40 mg twice daily

## 2022-07-23 NOTE — Assessment & Plan Note (Signed)
Chronic Controlled, Stable Continue Effexor 300 mg daily

## 2022-07-23 NOTE — Assessment & Plan Note (Signed)
Chronic Controlled, Stable Continue Seroquel 200 mg nightly

## 2022-07-23 NOTE — Assessment & Plan Note (Signed)
Chronic Following with nephrology GFR improved after stopping diclofenac and hydrochlorothiazide-had to be restarted on hydrochlorothiazide secondary to edema CBC, CMP

## 2022-07-23 NOTE — Assessment & Plan Note (Signed)
Chronic Still smoking-stressed smoking cessation Has chronic shortness of breath, intermittent cough, wheeze Continue albuterol is needed On a different inhaler-currently on Spiriva

## 2022-07-23 NOTE — Assessment & Plan Note (Signed)
Chronic  Clinically euthyroid Check tsh and will titrate med dose if needed Currently taking levothyroxine 88 mcg daily 

## 2022-07-23 NOTE — Assessment & Plan Note (Signed)
Chronic Currently on gabapentin at night, tizanidine and clonazepam Requip not effective-no longer taking

## 2022-07-26 DIAGNOSIS — J449 Chronic obstructive pulmonary disease, unspecified: Secondary | ICD-10-CM | POA: Diagnosis not present

## 2022-07-26 DIAGNOSIS — R0602 Shortness of breath: Secondary | ICD-10-CM | POA: Diagnosis not present

## 2022-07-28 NOTE — Progress Notes (Signed)
Subjective:    Patient ID: Jill MCAULEY, female    DOB: 1955-09-05, 66 y.o.   MRN: [id number]    HPI The patient is here for follow up of their chronic medical problems, including htn, DM, CKD, COPD, hypothyroid, gerd, anxiety, depression, RLS, insomnia   Consider changing inhaler-can try Breztri   BP is elevated today - she forgot to take medication today.   12/12  for L TKR  Has had some intermittent chest pain  - it occurs when he gets upset about something.  No pain to touch.  No associated symptoms.  Alka seltzer has not helped.  Resting or doing something different does not help.  Can last for 3-4 hours.  No pain x past 2 days.   Drinking water - maybe not as much as she should.   Medications and allergies reviewed with patient and updated if appropriate.  Patient Active Problem List   Diagnosis Date Noted   Unilateral primary osteoarthritis, left knee 01/09/2022   Onychomycosis 10/04/2021   Diarrhea 10/04/2021   Itching 07/16/2021   AKI (acute kidney injury) ([place]) 02/04/2021   Overdose 02/04/2021   Acute gout 06/20/2020   Interstitial cystitis 01/21/2020   Status post shoulder surgery 09/09/2019   Nausea 08/19/2019   Anxiety 05/04/2019   Insomnia 05/04/2019   Syncope 12/08/2018   Disorder of rotator cuff syndrome of left shoulder and allied disorder 06/22/2018   Bilateral leg edema 06/04/2018   Type 2 diabetes mellitus without complication, without long-term current use of insulin ([place]) 12/02/2017   Sacral pain 09/17/2017   COPD (chronic obstructive pulmonary disease) ([facility]) 04/21/2017   Lower back pain 03/03/2017   CKD (chronic kidney disease) stage 3, GFR 30-59 ml/min ([facility]CC) 08/07/2016   GERD (gastroesophageal reflux disease) 08/07/2016   Chronic respiratory failure ([place]) 07/29/2016   Arthritis of carpometacarpal ([place]) joint of left thumb 07/09/2016   Pneumothorax on left 01/31/2016   Chronic, continuous use of opioids 09/06/2015   Degenerative disc  disease, lumbar 08/01/2015   Spondylolisthesis of lumbar region 08/01/2015   Hypoxia 09/14/2014   Urinary, incontinence, stress female 05/06/2014   Constipation 05/05/2014   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/14/2013   HTN (hypertension) 05/19/2013   HLD (hyperlipidemia) 05/19/2013   Depression 05/19/2013   Hypothyroidism 05/19/2013   Tobacco abuse    Osteoarthritis of both knees 11/18/2011   RLS (restless legs syndrome) 11/18/2011    Current Outpatient Medications on File Prior to Visit  Medication Sig Dispense Refill   acetaminophen (TYLENOL) 500 MG tablet Take 1,500 mg by mouth daily as needed for moderate pain or headache.     albuterol (VENTOLIN HFA) 108 (90 Base) MCG/ACT inhaler INHALE 2 PUFFS INTO THE LUNGS EVERY 6 HOURS AS NEEDED FOR WHEEZING OR SHORTNESS OF BREATH. 8.5 g 0   amLODipine-benazepril (LOTREL) 10-40 MG capsule TAKE 1 CAPSULE BY MOUTH DAILY. 90 capsule 0   atorvastatin (LIPITOR) 20 MG tablet TAKE 1 TABLET BY MOUTH DAILY. 90 tablet 1   clonazePAM (KLONOPIN) 1 MG tablet TAKE 1 TABLET BY MOUTH DAILY. 30 tablet 0   docusate sodium (COLACE) 100 MG capsule Take 200 mg by mouth at bedtime.     Efinaconazole 10 % SOLN Apply daily to affected nails for 48 weeks 8 mL 1   fentaNYL (DURAGESIC) 25 MCG/HR 1 patch every 3 (three) days.     gabapentin (NEURONTIN) 400 MG capsule TAKE 1 CAPSULE BY MOUTH 3 TIMES DAILY. 270 capsule 0   hydrochlorothiazide (HYDRODIURIL) 12.5 MG  tablet TAKE 1 TABLET (12.5 MG TOTAL) BY MOUTH DAILY. 30 tablet 3   HYDROcodone-acetaminophen (NORCO) 10-325 MG tablet Take 1 tablet by mouth every 6 (six) hours as needed for severe pain (Chronic back pain). (Patient taking differently: Take 1-4 tablets by mouth 3 (three) times daily.) 84 tablet 0   levothyroxine (SYNTHROID) 88 MCG tablet TAKE 1 TABLET BY MOUTH DAILY. 90 tablet 1   Melatonin 10 MG TABS Take 30 mg by mouth at bedtime.     NARCAN 4 MG/0.1ML LIQD nasal spray kit Place 1 spray into the nose as needed (accidental  overdose). 1 each 0   Nebivolol HCl 20 MG TABS TAKE 1 TABLET (20 MG TOTAL) BY MOUTH DAILY. 90 tablet 0   nicotine (NICODERM CQ - DOSED IN MG/24 HOURS) 21 mg/24hr patch Place 1 patch (21 mg total) onto the skin daily. 28 patch 1   omeprazole (PRILOSEC) 40 MG capsule TAKE 1 CAPSULE BY MOUTH 2 TIMES DAILY BEFORE A MEAL. 180 capsule 3   promethazine (PHENERGAN) 50 MG tablet Take 1 tablet (50 mg total) by mouth every 8 (eight) hours as needed for nausea or vomiting. 15 tablet 0   QUEtiapine (SEROQUEL) 200 MG tablet Take 1 tablet (200 mg total) by mouth at bedtime. 30 tablet 3   solifenacin (VESICARE) 5 MG tablet Take 5 mg by mouth 2 (two) times daily.     SPIRIVA HANDIHALER 18 MCG inhalation capsule PLACE 1 CAPSULE INTO INHALER AND INHALE DAILY. 30 capsule 12   tiZANidine (ZANAFLEX) 4 MG tablet TAKE 1 TABLET BY MOUTH EVERY 8 HOURS AS NEEDED 90 tablet 2   valACYclovir (VALTREX) 500 MG tablet TAKE 1 TABLET BY MOUTH DAILY. 90 tablet 1   venlafaxine XR (EFFEXOR-XR) 150 MG 24 hr capsule Plan TAKE 2 CAPSULES BY MOUTH DAILY. 180 capsule 1   No current facility-administered medications on file prior to visit.    Past Medical History:  Diagnosis Date   Active smoker    Anxiety    Calcifying tendinitis of shoulder    Chronic back pain    Chronic pain syndrome    COPD (chronic obstructive pulmonary disease) (HCC)    Dysthymic disorder    Emphysema lung (HCC)    GERD (gastroesophageal reflux disease)    Headache    "weekly maybe" (01/31/2016)   Heart murmur    for years, nothing to be concerned about   Herpes genitalia    History of blood transfusion 1980   related to "back surgery"   Hyperlipidemia    Hypertension    Hypothyroidism    Lumbago    Osteoarthrosis, unspecified whether generalized or localized, lower leg    Pain in joint, upper arm    Pneumothorax, left 01/31/2016   S/P Left posterior subcostal pain injection on 01/30/2016   PONV (postoperative nausea and vomiting)    gets nauseous   with longer surgery. Difficuty voiding after surgery   Postlaminectomy syndrome, thoracic region    Primary localized osteoarthrosis, lower leg    Restless leg syndrome    Sleep apnea    s/p surgery- last sleep study 2011- doesnt use oxygen or machine at night as instructed,.   12/2014- Dr Halford Chessman  reports it is negative.   Thyroid disease     Past Surgical History:  Procedure Laterality Date   APPENDECTOMY     BACK SURGERY     18 back surgeries (2 thoracic & 16 lumbar) (01/31/2016)   DILATION AND CURETTAGE OF UTERUS  HAMMER TOE SURGERY     IR RADIOLOGIST EVAL & MGMT  07/21/2017   JOINT REPLACEMENT     KNEE ARTHROSCOPY Right    LAPAROSCOPIC CHOLECYSTECTOMY     LUMBAR FUSION N/A 08/01/2015   Procedure: Right sided L1-2 and L2-3 transforaminal lumbar interbody fusion with cages, Extension of posterior fusion T12 to L3, Replaced pedicle screws bilaterally L1-L2 , Replaced left sided pedicle screws L-3. Instrumentation T12 to L3 using local bone graft, Vivigen allograft and cancellous chips;  Surgeon: [name], MD;  Location: [facility];  Service: Orthopedics;  Laterality: N/A;   LUMBAR LAMINECTOMY/DECOMPRESSION MICRODISCECTOMY N/A 01/28/2014   Procedure: Minimally Invasive Right  L1-2 Microdiscectomy;  Surgeon: [name], MD;  Location: [facility];  Service: Orthopedics;  Laterality: N/A;   REVERSE SHOULDER ARTHROPLASTY Left 09/09/2019   Procedure: LEFT REVERSE SHOULDER REPLACEMENT;  Surgeon: Meredith Pel, MD;  Location: [facility];  Service: Orthopedics;  Laterality: Left;   TOTAL HIP ARTHROPLASTY Right    TOTAL KNEE ARTHROPLASTY  05/29/2012   Procedure: TOTAL KNEE ARTHROPLASTY;  Surgeon: Mcarthur Rossetti, MD;  Location: [facility];  Service: Orthopedics;  Laterality: Right;  Right Total Knee Arthroplasty   TUBAL LIGATION     UVULOPALATOPHARYNGOPLASTY     VAGINAL HYSTERECTOMY      Social History   Socioeconomic History   Marital status: Divorced    Spouse name: n/a   Number of  children: 2   Years of education: 12+   Highest education level: Not on file  Occupational History   Occupation: disability    Comment: back surgeries  Tobacco Use   Smoking status: Every Day    Packs/day: 1.25    Years: 45.00    Total pack years: 56.25    Types: Cigarettes   Smokeless tobacco: Never   Tobacco comments:    Uses Nicoderm patch discussed 1-800-quit-now  Vaping Use   Vaping Use: Never used  Substance and Sexual Activity   Alcohol use: No    Alcohol/week: 0.0 standard drinks of alcohol   Drug use: No    Types: Marijuana    Comment: 01/31/2016 "none since ~ 1980"   Sexual activity: Never    Partners: Male  Other Topics Concern   Not on file  Social History Narrative   Lives alone.  One daughter is local, but is getting ready to move to [place], where her children live with their father.  The other daughter lives near [place], [place].   Social Determinants of Health   Financial Resource Strain: Low Risk  (07/15/2022)   Overall Financial Resource Strain (CARDIA)    Difficulty of Paying Living Expenses: Not very hard  Food Insecurity: No Food Insecurity (07/15/2022)   Hunger Vital Sign    Worried About Running Out of Food in the Last Year: Never true    Ran Out of Food in the Last Year: Never true  Transportation Needs: No Transportation Needs (07/15/2022)   PRAPARE - Hydrologist (Medical): No    Lack of Transportation (Non-Medical): No  Physical Activity: Inactive (07/15/2022)   Exercise Vital Sign    Days of Exercise per Week: 0 days    Minutes of Exercise per Session: 0 min  Stress: No Stress Concern Present (07/15/2022)   [place]    Feeling of Stress : Only a little  Social Connections: Socially Isolated (07/15/2022)   Social Connection and Isolation Panel [NHANES]  Frequency of Communication with Friends and Family: More than three times a week     Frequency of Social Gatherings with Friends and Family: More than three times a week    Attends Religious Services: Never    Marine scientist or Organizations: No    Attends Music therapist: Never    Marital Status: Divorced    Family History  Problem Relation Age of Onset   Kidney disease Mother    Heart disease Father    Anuerysm Brother 29       brain   Heart disease Brother    Heart disease Sister 61       s/p CABG   Hypertension Sister    Colon cancer Neg Hx     Review of Systems  Constitutional:  Negative for chills and fever.  Respiratory:  Positive for wheezing (intermittent). Negative for cough and shortness of breath.   Cardiovascular:  Positive for chest pain. Negative for palpitations and leg swelling.  Neurological:  Negative for light-headedness and headaches.       Objective:   Vitals:   07/30/22 1305  BP: (!) 152/78  Pulse: 77  Temp: 98.9 F (37.2 C)  SpO2: 94%   BP Readings from Last 3 Encounters:  07/30/22 (!) 152/78  05/08/22 (!) 142/80  04/04/22 109/68   Wt Readings from Last 3 Encounters:  07/30/22 152 lb (68.9 kg)  05/08/22 151 lb (68.5 kg)  03/06/22 151 lb (68.5 kg)   Body mass index is 26.93 kg/m.   Physical Exam    Constitutional: Appears well-developed and well-nourished. No distress.  HENT:  Head: Normocephalic and atraumatic.  Neck: Neck supple. No tracheal deviation present. No thyromegaly present.  No cervical lymphadenopathy Cardiovascular: Normal rate, regular rhythm and normal heart sounds.   No murmur heard. No carotid bruit .  No edema Pulmonary/Chest: Effort normal and breath sounds normal. No respiratory distress. No has no wheezes. No rales.  Skin: Skin is warm and dry. Not diaphoretic.  Psychiatric: Normal mood and affect. Behavior is normal.      Assessment & Plan:    See Problem List for Assessment and Plan of chronic medical problems.

## 2022-07-28 NOTE — Patient Instructions (Addendum)
    Flu immunization an prevnar pneumonia vaccine administered today.    Blood work was ordered.   The lab is on the first floor.    Medications changes include :   none   A referral was ordered for cardiology.     Someone will call you to schedule an appointment.    Return in about 6 months (around 01/28/2023) for follow up.

## 2022-07-29 ENCOUNTER — Encounter: Payer: Self-pay | Admitting: Internal Medicine

## 2022-07-29 ENCOUNTER — Other Ambulatory Visit: Payer: Self-pay | Admitting: Internal Medicine

## 2022-07-30 ENCOUNTER — Ambulatory Visit (INDEPENDENT_AMBULATORY_CARE_PROVIDER_SITE_OTHER): Payer: Medicare Other | Admitting: Internal Medicine

## 2022-07-30 VITALS — BP 140/72 | HR 77 | Temp 98.9°F | Ht 63.0 in | Wt 152.0 lb

## 2022-07-30 DIAGNOSIS — E038 Other specified hypothyroidism: Secondary | ICD-10-CM

## 2022-07-30 DIAGNOSIS — G2581 Restless legs syndrome: Secondary | ICD-10-CM

## 2022-07-30 DIAGNOSIS — F3289 Other specified depressive episodes: Secondary | ICD-10-CM

## 2022-07-30 DIAGNOSIS — G4709 Other insomnia: Secondary | ICD-10-CM | POA: Diagnosis not present

## 2022-07-30 DIAGNOSIS — K219 Gastro-esophageal reflux disease without esophagitis: Secondary | ICD-10-CM

## 2022-07-30 DIAGNOSIS — I10 Essential (primary) hypertension: Secondary | ICD-10-CM

## 2022-07-30 DIAGNOSIS — N1832 Chronic kidney disease, stage 3b: Secondary | ICD-10-CM | POA: Diagnosis not present

## 2022-07-30 DIAGNOSIS — E1122 Type 2 diabetes mellitus with diabetic chronic kidney disease: Secondary | ICD-10-CM

## 2022-07-30 DIAGNOSIS — E7849 Other hyperlipidemia: Secondary | ICD-10-CM

## 2022-07-30 DIAGNOSIS — Z23 Encounter for immunization: Secondary | ICD-10-CM

## 2022-07-30 DIAGNOSIS — J439 Emphysema, unspecified: Secondary | ICD-10-CM | POA: Diagnosis not present

## 2022-07-30 DIAGNOSIS — E119 Type 2 diabetes mellitus without complications: Secondary | ICD-10-CM

## 2022-07-30 DIAGNOSIS — R079 Chest pain, unspecified: Secondary | ICD-10-CM | POA: Diagnosis not present

## 2022-07-30 DIAGNOSIS — F419 Anxiety disorder, unspecified: Secondary | ICD-10-CM

## 2022-07-30 LAB — COMPREHENSIVE METABOLIC PANEL
ALT: 18 U/L (ref 0–35)
AST: 27 U/L (ref 0–37)
Albumin: 4.9 g/dL (ref 3.5–5.2)
Alkaline Phosphatase: 136 U/L — ABNORMAL HIGH (ref 39–117)
BUN: 13 mg/dL (ref 6–23)
CO2: 27 mEq/L (ref 19–32)
Calcium: 9.8 mg/dL (ref 8.4–10.5)
Chloride: 103 mEq/L (ref 96–112)
Creatinine, Ser: 1.07 mg/dL (ref 0.40–1.20)
GFR: 53.76 mL/min — ABNORMAL LOW (ref >60.00)
Glucose, Bld: 98 mg/dL (ref 70–99)
Potassium: 4.3 mEq/L (ref 3.5–5.1)
Sodium: 140 mEq/L (ref 135–145)
Total Bilirubin: 0.4 mg/dL (ref 0.2–1.2)
Total Protein: 8.6 g/dL — ABNORMAL HIGH (ref 6.0–8.3)

## 2022-07-30 LAB — CBC WITH DIFFERENTIAL/PLATELET
Basophils Absolute: 0 10*3/uL (ref 0.0–0.1)
Basophils Relative: 0.4 % (ref 0.0–3.0)
Eosinophils Absolute: 0.1 10*3/uL (ref 0.0–0.7)
Eosinophils Relative: 1.5 % (ref 0.0–5.0)
HCT: 43.5 % (ref 36.0–46.0)
Hemoglobin: 14.5 g/dL (ref 12.0–15.0)
Lymphocytes Relative: 23.2 % (ref 12.0–46.0)
Lymphs Abs: 1.9 10*3/uL (ref 0.7–4.0)
MCHC: 33.4 g/dL (ref 30.0–36.0)
MCV: 99.1 fl (ref 78.0–100.0)
Monocytes Absolute: 0.4 10*3/uL (ref 0.1–1.0)
Monocytes Relative: 4.8 % (ref 3.0–12.0)
Neutro Abs: 5.8 10*3/uL (ref 1.4–7.7)
Neutrophils Relative %: 70.1 % (ref 43.0–77.0)
Platelets: 266 10*3/uL (ref 150.0–400.0)
RBC: 4.39 Mil/uL (ref 3.87–5.11)
RDW: 13.9 % (ref 11.5–15.5)
WBC: 8.3 10*3/uL (ref 4.0–10.5)

## 2022-07-30 LAB — LIPID PANEL
Cholesterol: 223 mg/dL — ABNORMAL HIGH (ref 0–200)
HDL: 50 mg/dL (ref >39.00)
LDL Cholesterol: 139 mg/dL — ABNORMAL HIGH (ref 0–99)
NonHDL: 172.59
Total CHOL/HDL Ratio: 4
Triglycerides: 167 mg/dL — ABNORMAL HIGH (ref 0.0–149.0)
VLDL: 33.4 mg/dL (ref 0.0–40.0)

## 2022-07-30 LAB — HEMOGLOBIN A1C: Hgb A1c MFr Bld: 6.3 % (ref 4.6–6.5)

## 2022-07-30 LAB — TSH: TSH: 2.26 u[IU]/mL (ref 0.35–5.50)

## 2022-07-30 NOTE — Assessment & Plan Note (Signed)
Chronic Controlled, Stable Continue Effexor XR 300 mg daily

## 2022-07-30 NOTE — Assessment & Plan Note (Signed)
Chronic °Regular exercise and healthy diet encouraged °Check lipid panel  °Continue atorvastatin 20 mg daily °

## 2022-07-30 NOTE — Assessment & Plan Note (Signed)
Chronic Still smoking-stressed smoking cessation Has chronic shortness of breath, intermittent cough and wheeze Continue albuterol as needed Currently on Spiriva daily Advised considering a different maintenance inhaler

## 2022-07-30 NOTE — Assessment & Plan Note (Signed)
Chronic Taking gabapentin 400 mg nightly, tizanidine 4 mg nightly

## 2022-07-30 NOTE — Assessment & Plan Note (Signed)
Chronic   Lab Results  Component Value Date   HGBA1C 6.3 01/11/2022   Sugars well controlled Check A1c, urine microalbumin today Continue lifestyle control Stressed regular exercise, diabetic diet

## 2022-07-30 NOTE — Assessment & Plan Note (Signed)
Chronic GERD controlled Continue omeprazole 40 mg twice daily

## 2022-07-30 NOTE — Assessment & Plan Note (Signed)
Chronic Controlled, Stable Continue Effexor XR 300 mg daily, clonazepam 1 mg once daily

## 2022-07-30 NOTE — Assessment & Plan Note (Signed)
New Has had chest pain x 2 weeks - none x 2 days - pain in central chest - can last for hrs.  Not related to activity. No assoc symptoms.  Alka seltzer did not help Previous Ct left sided coronary calcif, smoker, DM ? Angina Will refer to cardio

## 2022-07-30 NOTE — Assessment & Plan Note (Signed)
Chronic Following with nephrology GFR improved after stopping diclofenac and hydrochlorothiazide, but she had leg swelling and had to restart hydrochlorothiazide at low-dose CBC, CMP

## 2022-07-30 NOTE — Assessment & Plan Note (Signed)
Chronic Controlled, Stable Continue Seroquel 200 mg nightly

## 2022-07-30 NOTE — Assessment & Plan Note (Signed)
Chronic  Clinically euthyroid Check tsh and will titrate med dose if needed Currently taking levothyroxine 88 mcg daily 

## 2022-07-30 NOTE — Addendum Note (Signed)
Addended by: Marcina Millard on: 07/30/2022 03:43 PM   Modules accepted: Orders

## 2022-07-30 NOTE — Assessment & Plan Note (Signed)
Chronic Blood pressure well controlled CMP Continue Motifene-benazepril 10-40 mg daily, hydrochlorothiazide 12.5 mg daily Nebivolol 20 mg daily

## 2022-08-01 ENCOUNTER — Other Ambulatory Visit: Payer: Self-pay | Admitting: Internal Medicine

## 2022-08-01 NOTE — Telephone Encounter (Signed)
Patient called back about her serequel - patient was told that it would be sent in after her appointment the other day.

## 2022-08-06 ENCOUNTER — Ambulatory Visit: Payer: Medicare Other | Admitting: Cardiovascular Disease

## 2022-08-06 ENCOUNTER — Other Ambulatory Visit: Payer: Self-pay | Admitting: Physician Assistant

## 2022-08-06 DIAGNOSIS — Z01818 Encounter for other preprocedural examination: Secondary | ICD-10-CM

## 2022-08-07 ENCOUNTER — Ambulatory Visit: Payer: Medicare Other | Attending: Cardiovascular Disease | Admitting: Cardiovascular Disease

## 2022-08-07 NOTE — Pre-Procedure Instructions (Signed)
Surgical Instructions    Your procedure is scheduled on Tuesday, December 12.  Report to [facility] Main Entrance "A" at 11:40 A.M., then check in with the Admitting office.  Call this number if you have problems the morning of surgery:  [phone]   If you have any questions prior to your surgery date call [phone]: Open Monday-Friday 8am-4pm If you experience any cold or flu symptoms such as cough, fever, chills, shortness of breath, etc. between now and your scheduled surgery, please notify us at the above number     Remember:  Do not eat after midnight the night before your surgery  You may drink clear liquids until 10:40AM the morning of your surgery.   Clear liquids allowed are: Water, Non-Citrus Juices (without pulp), Carbonated Beverages, Clear Tea, Black Coffee ONLY (NO MILK, CREAM OR POWDERED CREAMER of any kind), and Gatorade  Patient Instructions  The night before surgery:  No food after midnight. ONLY clear liquids after midnight  The day of surgery (if you do NOT have diabetes):  Drink ONE (1) Pre-Surgery Clear Ensure by 10:40AM the morning of surgery. Drink in one sitting. Do not sip.  This drink was given to you during your hospital  pre-op appointment visit.  Nothing else to drink after completing the  Pre-Surgery Clear Ensure.          If you have questions, please contact your surgeon's office.     Take these medicines the morning of surgery with A SIP OF WATER:  atorvastatin (LIPITOR) gabapentin (NEURONTIN)  levothyroxine (SYNTHROID)  Nebivolol  omeprazole (PRILOSEC)  solifenacin (VESICARE)  SPIRIVA   valACYclovir (VALTREX)  venlafaxine XR (EFFEXOR-XR)   tiZANidine (ZANAFLEX)  if needed promethazine (PHENERGAN)  if needed HYDROcodone-acetaminophen (NORCO)  if needed clonazePAM (KLONOPIN) if needed acetaminophen (TYLENOL)  if needed albuterol (VENTOLIN HFA) 108 (90 Base) MCG/ACT inhaler  if needed- Please bring all inhalers with you the day  of surgery.     As of today, STOP taking any Aspirin (unless otherwise instructed by your surgeon) Aleve, Naproxen, Ibuprofen, Motrin, Advil, Goody's, BC's, all herbal medications, fish oil, and all vitamins.           [facility] is not responsible for any belongings or valuables.    Do NOT Smoke (Tobacco/Vaping)  24 hours prior to your procedure  If you use a CPAP at night, you may bring your mask for your overnight stay.   Contacts, glasses, hearing aids, dentures or partials may not be worn into surgery, please bring cases for these belongings   For patients admitted to the hospital, discharge time will be determined by your treatment team.   Patients discharged the day of surgery will not be allowed to drive home, and someone needs to stay with them for 24 hours.   SURGICAL WAITING ROOM VISITATION Patients having surgery or a procedure may have no more than 2 support people in the waiting area - these visitors may rotate.   Children under the age of 7 must have an adult with them who is not the patient. If the patient needs to stay at the hospital during part of their recovery, the visitor guidelines for inpatient rooms apply. Pre-op nurse will coordinate an appropriate time for 1 support person to accompany patient in pre-op.  This support person may not rotate.   Please refer to [email or web address] for the visitor guidelines for Inpatients (after your surgery is over and you are in a regular room).    Special instructions:  Oral Hygiene is also important to reduce your risk of infection.  Remember - BRUSH YOUR TEETH THE MORNING OF SURGERY WITH YOUR REGULAR TOOTHPASTE   [facility]- Preparing For Surgery  Before surgery, you can play an important role. Because skin is not sterile, your skin needs to be as free of germs as possible. You can reduce the number of germs on your skin by washing with CHG (chlorahexidine  gluconate) Soap before surgery.  CHG is an antiseptic cleaner which kills germs and bonds with the skin to continue killing germs even after washing.     Please do not use if you have an allergy to CHG or antibacterial soaps. If your skin becomes reddened/irritated stop using the CHG.  Do not shave (including legs and underarms) for at least 48 hours prior to first CHG shower. It is OK to shave your face.  Please follow these instructions carefully.     Shower the NIGHT BEFORE SURGERY and the MORNING OF SURGERY with CHG Soap.   If you chose to wash your hair, wash your hair first as usual with your normal shampoo. After you shampoo, rinse your hair and body thoroughly to remove the shampoo.  Then [employer] and genitals (private parts) with your normal soap and rinse thoroughly to remove soap.  After that Use CHG Soap as you would any other liquid soap. You can apply CHG directly to the skin and wash gently with a scrungie or a clean washcloth.   Apply the CHG Soap to your body ONLY FROM THE NECK DOWN.  Do not use on open wounds or open sores. Avoid contact with your eyes, ears, mouth and genitals (private parts). Wash Face and genitals (private parts)  with your normal soap.   Wash thoroughly, paying special attention to the area where your surgery will be performed.  Thoroughly rinse your body with warm water from the neck down.  DO NOT shower/wash with your normal soap after using and rinsing off the CHG Soap.  Pat yourself dry with a CLEAN TOWEL.  Wear CLEAN PAJAMAS to bed the night before surgery  Place CLEAN SHEETS on your bed the night before your surgery  DO NOT SLEEP WITH PETS.   Day of Surgery:  Take a shower with CHG soap. Wear Clean/Comfortable clothing the morning of surgery Do not wear jewelry or makeup. Do not wear lotions, powders, perfumes/cologne or deodorant. Do not shave 48 hours prior to surgery.  Men may shave face and neck. Do not bring valuables to the  hospital. Do not wear nail polish, gel polish, artificial nails, or any other type of covering on natural nails (fingers and toes) If you have artificial nails or gel coating that need to be removed by a nail salon, please have this removed prior to surgery. Artificial nails or gel coating may interfere with anesthesia's ability to adequately monitor your vital signs. Remember to brush your teeth WITH YOUR REGULAR TOOTHPASTE.    If you received a COVID test during your pre-op visit, it is requested that you wear a mask when out in public, stay away from anyone that may not be feeling well, and notify your surgeon if you develop symptoms. If you have been in contact with anyone that has tested positive in the last 10 days, please notify your surgeon.    Please read over the following fact sheets that you were given.

## 2022-08-08 ENCOUNTER — Other Ambulatory Visit: Payer: Self-pay

## 2022-08-08 ENCOUNTER — Encounter (HOSPITAL_COMMUNITY)
Admission: RE | Admit: 2022-08-08 | Discharge: 2022-08-08 | Disposition: A | Discharge status: Home / Self Care | Payer: Medicare Other | Source: Ambulatory Visit | Attending: Orthopaedic Surgery | Admitting: Orthopaedic Surgery

## 2022-08-08 ENCOUNTER — Encounter (HOSPITAL_COMMUNITY): Payer: Self-pay

## 2022-08-08 VITALS — BP 133/79 | HR 61 | Temp 98.2°F | Resp 18 | Ht 63.0 in | Wt 150.4 lb

## 2022-08-08 DIAGNOSIS — Z01818 Encounter for other preprocedural examination: Secondary | ICD-10-CM | POA: Diagnosis not present

## 2022-08-08 HISTORY — DX: Prediabetes: R73.03

## 2022-08-08 HISTORY — DX: Chronic kidney disease, unspecified: N18.9

## 2022-08-08 LAB — COMPREHENSIVE METABOLIC PANEL
ALT: 23 U/L (ref 0–44)
AST: 34 U/L (ref 15–41)
Albumin: 3.9 g/dL (ref 3.5–5.0)
Alkaline Phosphatase: 109 U/L (ref 38–126)
Anion gap: 9 (ref 5–15)
BUN: 17 mg/dL (ref 8–23)
CO2: 28 mmol/L (ref 22–32)
Calcium: 9 mg/dL (ref 8.9–10.3)
Chloride: 103 mmol/L (ref 98–111)
Creatinine, Ser: 1.41 mg/dL — ABNORMAL HIGH (ref 0.44–1.00)
GFR, Estimated: 41 mL/min — ABNORMAL LOW (ref >60)
Glucose, Bld: 98 mg/dL (ref 70–99)
Potassium: 4.4 mmol/L (ref 3.5–5.1)
Sodium: 140 mmol/L (ref 135–145)
Total Bilirubin: 0.6 mg/dL (ref 0.3–1.2)
Total Protein: 7.1 g/dL (ref 6.5–8.1)

## 2022-08-08 LAB — CBC
HCT: 43.1 % (ref 36.0–46.0)
Hemoglobin: 13.8 g/dL (ref 12.0–15.0)
MCH: 32 pg (ref 26.0–34.0)
MCHC: 32 g/dL (ref 30.0–36.0)
MCV: 100 fL (ref 80.0–100.0)
Platelets: 254 10*3/uL (ref 150–400)
RBC: 4.31 MIL/uL (ref 3.87–5.11)
RDW: 13.2 % (ref 11.5–15.5)
WBC: 7.7 10*3/uL (ref 4.0–10.5)
nRBC: 0 % (ref 0.0–0.2)

## 2022-08-08 LAB — TYPE AND SCREEN
ABO/RH(D): A POS
Antibody Screen: NEGATIVE

## 2022-08-08 LAB — GLUCOSE, CAPILLARY: Glucose-Capillary: 120 mg/dL — ABNORMAL HIGH (ref 70–99)

## 2022-08-08 LAB — SURGICAL PCR SCREEN
MRSA, PCR: NEGATIVE
Staphylococcus aureus: NEGATIVE

## 2022-08-08 NOTE — Progress Notes (Signed)
PCP - Billey Gosling, MD Cardiologist - denies  PPM/ICD - denies Device Orders - n/a Rep Notified - n/a  Chest x-ray - n/a EKG - 08/08/22 Stress Test - denies ECHO - denies Cardiac Cath - denies  Sleep Study - denies CPAP - n/a  Fasting Blood Sugar - n/a  Last dose of GLP1 agonist-  n/a  Blood Thinner Instructions: n/a  Aspirin Instructions: Patient was instructed: As of today, STOP taking any Aspirin (unless otherwise instructed by your surgeon) Aleve, Naproxen, Ibuprofen, Motrin, Advil, Goody's, BC's, all herbal medications, fish oil, and all vitamins.   ERAS Protcol - yes PRE-SURGERY Ensure - yes, until 10:40 o'clock  COVID TEST- n/a   Anesthesia review: yes - history of heart murmur  Patient denies shortness of breath, fever, cough and chest pain at PAT appointment   All instructions explained to the patient, with a verbal understanding of the material. Patient agrees to go over the instructions while at home for a better understanding. Patient also instructed to self quarantine after being tested for COVID-19. The opportunity to ask questions was provided.

## 2022-08-08 NOTE — Pre-Procedure Instructions (Signed)
Surgical Instructions    Your procedure is scheduled on Tuesday, December 12.  Report to [facility] Main Entrance "A" at 11:40 A.M., then check in with the Admitting office.  Call this number if you have problems the morning of surgery:  [phone]   If you have any questions prior to your surgery date call [phone]: Open Monday-Friday 8am-4pm If you experience any cold or flu symptoms such as cough, fever, chills, shortness of breath, etc. between now and your scheduled surgery, please notify us at the above number     Remember:  Do not eat after midnight the night before your surgery  You may drink clear liquids until 10:40AM the morning of your surgery.   Clear liquids allowed are: Water, Non-Citrus Juices (without pulp), Carbonated Beverages, Clear Tea, Black Coffee ONLY (NO MILK, CREAM OR POWDERED CREAMER of any kind), and Gatorade  Patient Instructions  The night before surgery:  No food after midnight. ONLY clear liquids after midnight  The day of surgery (if you do NOT have diabetes):  Drink ONE (1) Pre-Surgery Clear Ensure by 10:40AM the morning of surgery. Drink in one sitting. Do not sip.  This drink was given to you during your hospital  pre-op appointment visit.  Nothing else to drink after completing the  Pre-Surgery Clear Ensure.          If you have questions, please contact your surgeon's office.     Take these medicines the morning of surgery with A SIP OF WATER:   atorvastatin (LIPITOR) gabapentin (NEURONTIN)  levothyroxine (SYNTHROID)  Nebivolol  omeprazole (PRILOSEC)  solifenacin (VESICARE)  SPIRIVA   valACYclovir (VALTREX)  venlafaxine XR (EFFEXOR-XR)   tiZANidine (ZANAFLEX)  if needed promethazine (PHENERGAN)  if needed HYDROcodone-acetaminophen (NORCO)  if needed clonazePAM (KLONOPIN) if needed acetaminophen (TYLENOL)  if needed albuterol (VENTOLIN HFA) 108 (90 Base) MCG/ACT inhaler  if needed- Please bring all inhalers with you the  day of surgery.    As of today, STOP taking any Aspirin (unless otherwise instructed by your surgeon) Aleve, Naproxen, Ibuprofen, Motrin, Advil, Goody's, BC's, all herbal medications, fish oil, and all vitamins.           [facility] is not responsible for any belongings or valuables.    Do NOT Smoke (Tobacco/Vaping)  24 hours prior to your procedure  If you use a CPAP at night, you may bring your mask for your overnight stay.   Contacts, glasses, hearing aids, dentures or partials may not be worn into surgery, please bring cases for these belongings   For patients admitted to the hospital, discharge time will be determined by your treatment team.   Patients discharged the day of surgery will not be allowed to drive home, and someone needs to stay with them for 24 hours.   SURGICAL WAITING ROOM VISITATION Patients having surgery or a procedure may have no more than 2 support people in the waiting area - these visitors may rotate.   Children under the age of 72 must have an adult with them who is not the patient. If the patient needs to stay at the hospital during part of their recovery, the visitor guidelines for inpatient rooms apply. Pre-op nurse will coordinate an appropriate time for 1 support person to accompany patient in pre-op.  This support person may not rotate.   Please refer to [email or web address] for the visitor guidelines for Inpatients (after your surgery is over and you are in a regular room).    Special instructions:  Oral Hygiene is also important to reduce your risk of infection.  Remember - BRUSH YOUR TEETH THE MORNING OF SURGERY WITH YOUR REGULAR TOOTHPASTE   [place]- Preparing For Surgery  Before surgery, you can play an important role. Because skin is not sterile, your skin needs to be as free of germs as possible. You can reduce the number of germs on your skin by washing with CHG (chlorahexidine  gluconate) Soap before surgery.  CHG is an antiseptic cleaner which kills germs and bonds with the skin to continue killing germs even after washing.     Please do not use if you have an allergy to CHG or antibacterial soaps. If your skin becomes reddened/irritated stop using the CHG.  Do not shave (including legs and underarms) for at least 48 hours prior to first CHG shower. It is OK to shave your face.  Please follow these instructions carefully.     Shower the NIGHT BEFORE SURGERY and the MORNING OF SURGERY with CHG Soap.   If you chose to wash your hair, wash your hair first as usual with your normal shampoo. After you shampoo, rinse your hair and body thoroughly to remove the shampoo.  Then [employer] and genitals (private parts) with your normal soap and rinse thoroughly to remove soap.  After that Use CHG Soap as you would any other liquid soap. You can apply CHG directly to the skin and wash gently with a scrungie or a clean washcloth.   Apply the CHG Soap to your body ONLY FROM THE NECK DOWN.  Do not use on open wounds or open sores. Avoid contact with your eyes, ears, mouth and genitals (private parts). Wash Face and genitals (private parts)  with your normal soap.   Wash thoroughly, paying special attention to the area where your surgery will be performed.  Thoroughly rinse your body with warm water from the neck down.  DO NOT shower/wash with your normal soap after using and rinsing off the CHG Soap.  Pat yourself dry with a CLEAN TOWEL.  Wear CLEAN PAJAMAS to bed the night before surgery  Place CLEAN SHEETS on your bed the night before your surgery  DO NOT SLEEP WITH PETS.   Day of Surgery:  Take a shower with CHG soap. Wear Clean/Comfortable clothing the morning of surgery Do not wear jewelry or makeup. Do not wear lotions, powders, perfumes or deodorant. Do not shave 48 hours prior to surgery.   Do not bring valuables to the hospital. Do not wear nail polish, gel  polish, artificial nails, or any other type of covering on natural nails (fingers and toes) If you have artificial nails or gel coating that need to be removed by a nail salon, please have this removed prior to surgery. Artificial nails or gel coating may interfere with anesthesia's ability to adequately monitor your vital signs. Remember to brush your teeth WITH YOUR REGULAR TOOTHPASTE.    If you received a COVID test during your pre-op visit, it is requested that you wear a mask when out in public, stay away from anyone that may not be feeling well, and notify your surgeon if you develop symptoms. If you have been in contact with anyone that has tested positive in the last 10 days, please notify your surgeon.    Please read over the following fact sheets that you were given.

## 2022-08-09 ENCOUNTER — Other Ambulatory Visit: Payer: Self-pay | Admitting: Internal Medicine

## 2022-08-09 ENCOUNTER — Encounter (HOSPITAL_COMMUNITY): Payer: Self-pay | Admitting: Physician Assistant

## 2022-08-12 ENCOUNTER — Telehealth: Payer: Self-pay

## 2022-08-12 ENCOUNTER — Telehealth: Payer: Self-pay | Admitting: *Deleted

## 2022-08-12 ENCOUNTER — Encounter: Payer: Self-pay | Admitting: Internal Medicine

## 2022-08-12 ENCOUNTER — Ambulatory Visit: Payer: Medicare Other | Attending: Cardiovascular Disease | Admitting: Internal Medicine

## 2022-08-12 ENCOUNTER — Telehealth: Payer: Self-pay | Admitting: Internal Medicine

## 2022-08-12 VITALS — BP 120/62 | HR 62 | Ht 62.0 in | Wt 151.4 lb

## 2022-08-12 DIAGNOSIS — R079 Chest pain, unspecified: Secondary | ICD-10-CM | POA: Diagnosis not present

## 2022-08-12 NOTE — Telephone Encounter (Signed)
Nurse with Cardiogy at [facility] Dr. Harl Bowie office called stating need to cancel the surgery for tomorrow-pt needs a stress test in order to be cleared for surgery.   Call back. [phone]

## 2022-08-12 NOTE — Telephone Encounter (Signed)
Surgery cancelled pending stress test results.  I contacted patient to confirm.

## 2022-08-12 NOTE — Telephone Encounter (Signed)
Called surgeon's office (Dr. Rush Farmer) and advised them that patient will need Stress Test prior to being cleared for surgery per Dr. Phineas Inches. Surgery for tomorrow that is scheduled will need to be cancelled. Ortho Office verbalized understanding and will forward message. Call back number provided.   Myocardial perfusion test scheduled for 08/16/22.

## 2022-08-12 NOTE — Progress Notes (Signed)
Cardiology Office Note:    Date:  08/12/2022   ID:  Jill Phillips, DOB 08/23/1956, MRN [id number]  PCP:  [name], MD   [facility] Providers Cardiologist:  None     Referring MD: [name], MD   No chief complaint on file. Pre-OP  History of Present Illness:    Jill Phillips is a 66 y.o. female with a hx of HTN, COPD, pre-DM2, chronic smoker, thyroid dx, referred day prior to her schedule TKA for a pre-op. She has no hx of cardiac dx. She has CVD risk factors. Cardiology was asked to assess her cardiac risk for her procedure.  She notes intermittent CP for 3 weeks. Can last all day. Not necessarily related to activity. Took antacids, did not help. Occurs with minimal activity. She has chronic SOB with COPD. She is planned for L TKA   Cardiology Studies: TTE 06/24/2021: EF is normal, no significant valve dx, no pulmonary HTN  CT 06/23/2021-non gated: noted some CAC , don't see significant CAC  Past Medical History:  Diagnosis Date   Active smoker    Anxiety    Calcifying tendinitis of shoulder    Chronic back pain    Chronic kidney disease    Chronic pain syndrome    COPD (chronic obstructive pulmonary disease) (HCC)    Dysthymic disorder    Emphysema lung (HCC)    GERD (gastroesophageal reflux disease)    Headache    "weekly maybe" (01/31/2016)   Heart murmur    for years, nothing to be concerned about   Herpes genitalia    History of blood transfusion 09/02/1978   related to "back surgery"   Hyperlipidemia    Hypertension    Hypothyroidism    Lumbago    Osteoarthrosis, unspecified whether generalized or localized, lower leg    Pain in joint, upper arm    Pneumothorax, left 01/31/2016   S/P Left posterior subcostal pain injection on 01/30/2016   PONV (postoperative nausea and vomiting)    gets nauseous  with longer surgery. Difficuty voiding after surgery   Postlaminectomy syndrome, thoracic region    Pre-diabetes    Primary localized  osteoarthrosis, lower leg    Restless leg syndrome    Sleep apnea    s/p surgery- last sleep study 2011- doesnt use oxygen or machine at night as instructed,.   12/2014- Dr Halford Chessman  reports it is negative.   Thyroid disease     Past Surgical History:  Procedure Laterality Date   APPENDECTOMY     BACK SURGERY     18 back surgeries (2 thoracic & 16 lumbar) (01/31/2016)   DILATION AND CURETTAGE OF UTERUS     HAMMER TOE SURGERY     IR RADIOLOGIST EVAL & MGMT  07/21/2017   JOINT REPLACEMENT     KNEE ARTHROSCOPY Right    LAPAROSCOPIC CHOLECYSTECTOMY     LUMBAR FUSION N/A 08/01/2015   Procedure: Right sided L1-2 and L2-3 transforaminal lumbar interbody fusion with cages, Extension of posterior fusion T12 to L3, Replaced pedicle screws bilaterally L1-L2 , Replaced left sided pedicle screws L-3. Instrumentation T12 to L3 using local bone graft, Vivigen allograft and cancellous chips;  Surgeon: [name], MD;  Location: [facility];  Service: Orthopedics;  Laterality: N/A;   LUMBAR LAMINECTOMY/DECOMPRESSION MICRODISCECTOMY N/A 01/28/2014   Procedure: Minimally Invasive Right  L1-2 Microdiscectomy;  Surgeon: [name], MD;  Location: [facility];  Service: Orthopedics;  Laterality: N/A;   REVERSE  SHOULDER ARTHROPLASTY Left 09/09/2019   Procedure: LEFT REVERSE SHOULDER REPLACEMENT;  Surgeon: Meredith Pel, MD;  Location: [facility];  Service: Orthopedics;  Laterality: Left;   TOTAL HIP ARTHROPLASTY Right    TOTAL KNEE ARTHROPLASTY  05/29/2012   Procedure: TOTAL KNEE ARTHROPLASTY;  Surgeon: Mcarthur Rossetti, MD;  Location: [facility];  Service: Orthopedics;  Laterality: Right;  Right Total Knee Arthroplasty   TUBAL LIGATION     UVULOPALATOPHARYNGOPLASTY     VAGINAL HYSTERECTOMY      Current Medications: No outpatient medications have been marked as taking for the 08/12/22 encounter (Appointment) with Janina Mayo, MD.     Allergies:   Flagyl [metronidazole], Limonene, Sulfa antibiotics, Doxycycline,  Ibuprofen, Amoxicillin, Chlorzoxazone, Codeine, Darvocet [propoxyphene n-acetaminophen], Dilaudid [hydromorphone hcl], Keflex [cephalexin], Morphine and related, Nitrofurantoin monohyd macro, Percocet [oxycodone-acetaminophen], and Trazodone and nefazodone   Social History   Socioeconomic History   Marital status: Divorced    Spouse name: n/a   Number of children: 2   Years of education: 12+   Highest education level: Not on file  Occupational History   Occupation: disability    Comment: back surgeries  Tobacco Use   Smoking status: Every Day    Packs/day: 1.00    Years: 45.00    Total pack years: 45.00    Types: Cigarettes   Smokeless tobacco: Never   Tobacco comments:    Uses Nicoderm patch discussed 1-800-quit-now  Vaping Use   Vaping Use: Never used  Substance and Sexual Activity   Alcohol use: No    Alcohol/week: 0.0 standard drinks of alcohol   Drug use: Not Currently    Types: Marijuana    Comment: 01/31/2016 "none since ~ 1980"   Sexual activity: Never    Partners: Male  Other Topics Concern   Not on file  Social History Narrative   Lives alone.  One daughter is local, but is getting ready to move to [place], where her children live with their father.  The other daughter lives near [place], [place].   Social Determinants of Health   Financial Resource Strain: Low Risk  (07/15/2022)   Overall Financial Resource Strain (CARDIA)    Difficulty of Paying Living Expenses: Not very hard  Food Insecurity: No Food Insecurity (07/15/2022)   Hunger Vital Sign    Worried About Running Out of Food in the Last Year: Never true    Ran Out of Food in the Last Year: Never true  Transportation Needs: No Transportation Needs (07/15/2022)   PRAPARE - Hydrologist (Medical): No    Lack of Transportation (Non-Medical): No  Physical Activity: Inactive (07/15/2022)   Exercise Vital Sign    Days of Exercise per Week: 0 days    Minutes of Exercise per  Session: 0 min  Stress: No Stress Concern Present (07/15/2022)   [place]    Feeling of Stress : Only a little  Social Connections: Socially Isolated (07/15/2022)   Social Connection and Isolation Panel [NHANES]    Frequency of Communication with Friends and Family: More than three times a week    Frequency of Social Gatherings with Friends and Family: More than three times a week    Attends Religious Services: Never    Marine scientist or Organizations: No    Attends Archivist Meetings: Never    Marital Status: Divorced     Family History: The patient's family history includes Anuerysm (  age of onset: 68) in her brother; Heart disease in her brother and father; Heart disease (age of onset: 30) in her sister; Hypertension in her sister; Kidney disease in her mother. There is no history of Colon cancer.  ROS:   Please see the history of present illness.     All other systems reviewed and are negative.  EKGs/Labs/Other Studies Reviewed:    The following studies were reviewed today:   EKG:  EKG is  ordered today.  The ekg ordered today demonstrates   08/12/2022- NSR  Recent Labs: 07/30/2022: TSH 2.26 08/08/2022: ALT 23; BUN 17; Creatinine, Ser 1.41; Hemoglobin 13.8; Platelets 254; Potassium 4.4; Sodium 140   Recent Lipid Panel    Component Value Date/Time   CHOL 223 (H) 07/30/2022 1347   TRIG 167.0 (H) 07/30/2022 1347   HDL 50.00 07/30/2022 1347   CHOLHDL 4 07/30/2022 1347   VLDL 33.4 07/30/2022 1347   LDLCALC 139 (H) 07/30/2022 1347   LDLDIRECT 110.0 07/16/2021 1424     Risk Assessment/Calculations:         Physical Exam:    VS:    Vitals:   08/12/22 1404  BP: 120/62  Pulse: 62  SpO2: 92%      Wt Readings from Last 3 Encounters:  08/08/22 150 lb 6.4 oz (68.2 kg)  07/30/22 152 lb (68.9 kg)  05/08/22 151 lb (68.5 kg)     GEN:  Well nourished, well developed in no acute  distress HEENT: Normal NECK: No JVD; No carotid bruits LYMPHATICS: No lymphadenopathy CARDIAC: RRR, no murmurs, rubs, gallops RESPIRATORY:  Clear to auscultation without rales, wheezing or rhonchi  ABDOMEN: Soft, non-tender, non-distended MUSCULOSKELETAL:  No edema; No deformity  SKIN: Warm and dry NEUROLOGIC:  Alert and oriented x 3 PSYCHIATRIC:  Normal affect   ASSESSMENT:    CP: she notes prolonged episodes of CP, risk includes smoker and age. Will plan an ETT Myoview prior to her L TKA.  PLAN:    In order of problems listed above:  ETT/ SPECT myoview Follow up pending results      Shared Decision Making/Informed Consent The risks [chest pain, shortness of breath, cardiac arrhythmias, dizziness, blood pressure fluctuations, myocardial infarction, stroke/transient ischemic attack, nausea, vomiting, allergic reaction, radiation exposure, metallic taste sensation and life-threatening complications (estimated to be 1 in 10,000)], benefits (risk stratification, diagnosing coronary artery disease, treatment guidance) and alternatives of a nuclear stress test were discussed in detail with Ms. Quinby and she agrees to proceed.    Medication Adjustments/Labs and Tests Ordered: Current medicines are reviewed at length with the patient today.  Concerns regarding medicines are outlined above.  No orders of the defined types were placed in this encounter.  No orders of the defined types were placed in this encounter.   There are no Patient Instructions on file for this visit.   Signed, Janina Mayo, MD  08/12/2022 10:50 AM    [place]

## 2022-08-12 NOTE — Patient Instructions (Signed)
Medication Instructions:  No Changes In Medications at this time.  *If you need a refill on your cardiac medications before your next appointment, please call your pharmacy*  Lab Work: None Ordered At This Time.  If you have labs (blood work) drawn today and your tests are completely normal, you will receive your results only by: [facility] (if you have MyChart) OR A paper copy in the mail If you have any lab test that is abnormal or we need to change your treatment, we will call you to review the results.  Testing/Procedures: Your physician has requested that you have en exercise stress myoview. For further information please visit [email or web address]. Please follow instruction sheet, as given. This will take place at [address]  The test will take approximately 3 to 4 hours to complete; you may bring reading material.  If someone comes with you to your appointment, they will need to remain in the main lobby due to limited space in the testing area.    How to prepare for your Myocardial Perfusion Test: Do not eat or drink 3 hours prior to your test, except you may have water. Do not consume products containing caffeine (regular or decaffeinated) 12 hours prior to your test. (ex: coffee, chocolate, sodas, tea). Do wear comfortable clothes (no dresses or overalls) and walking shoes, tennis shoes preferred (No heels or open toe shoes are allowed). Do NOT wear cologne, perfume, aftershave, or lotions (deodorant is allowed). If you use an inhaler, use it the AM of your test and bring it with you.  If you use a nebulizer, use it the AM of your test.  If these instructions are not followed, your test will have to be rescheduled.  Follow-Up: At [facility], you and your health needs are our priority.  As part of our continuing mission to provide you with exceptional heart care, we have created designated Provider Care Teams.  These Care Teams include your primary  Cardiologist (physician) and Advanced Practice Providers (APPs -  Physician Assistants and Nurse Practitioners) who all work together to provide you with the care you need, when you need it.  Your next appointment:   AS NEEDED   The format for your next appointment:   In Person  Provider:   Janina Mayo, MD

## 2022-08-12 NOTE — Telephone Encounter (Signed)
Patient called because she has an appointment with the Cardiologist tomorrow and said that Dr Quay Burow wanted her to have a test done, Patient would like a call back to know what the name of the test is. Call back is [phone]

## 2022-08-12 NOTE — Telephone Encounter (Signed)
Spoke with patient today.  She has appointment with Cardiology today.

## 2022-08-13 ENCOUNTER — Ambulatory Visit (HOSPITAL_COMMUNITY): Admission: RE | Admit: 2022-08-13 | Payer: Medicare Other | Source: Home / Self Care | Admitting: Orthopaedic Surgery

## 2022-08-13 ENCOUNTER — Other Ambulatory Visit: Payer: Self-pay | Admitting: Internal Medicine

## 2022-08-13 ENCOUNTER — Ambulatory Visit: Payer: Medicare Other | Admitting: Internal Medicine

## 2022-08-13 ENCOUNTER — Encounter: Payer: Self-pay | Admitting: Internal Medicine

## 2022-08-13 DIAGNOSIS — G8929 Other chronic pain: Secondary | ICD-10-CM

## 2022-08-13 SURGERY — ARTHROPLASTY, KNEE, TOTAL
Anesthesia: Spinal | Site: Knee | Laterality: Left

## 2022-08-14 DIAGNOSIS — Z79899 Other long term (current) drug therapy: Secondary | ICD-10-CM | POA: Diagnosis not present

## 2022-08-14 DIAGNOSIS — M545 Low back pain, unspecified: Secondary | ICD-10-CM | POA: Diagnosis not present

## 2022-08-14 MED ORDER — TIZANIDINE HCL 4 MG PO TABS: 4.0000 mg | ORAL_TABLET | Freq: Three times a day (TID) | ORAL | 2 refills | Status: DC | PRN

## 2022-08-15 NOTE — Telephone Encounter (Signed)
LDVM on patient's cell phone per DPR. She was asked to arrive 15 minutes prior to her scheduled appointment time. She was also asked to hold her Nebivolol.

## 2022-08-16 ENCOUNTER — Encounter (HOSPITAL_COMMUNITY): Payer: Medicare Other

## 2022-08-16 ENCOUNTER — Encounter (HOSPITAL_COMMUNITY): Payer: Self-pay

## 2022-08-19 DIAGNOSIS — Z79899 Other long term (current) drug therapy: Secondary | ICD-10-CM | POA: Diagnosis not present

## 2022-08-23 ENCOUNTER — Encounter (HOSPITAL_COMMUNITY): Payer: Medicare Other

## 2022-08-25 DIAGNOSIS — R0602 Shortness of breath: Secondary | ICD-10-CM | POA: Diagnosis not present

## 2022-08-25 DIAGNOSIS — J449 Chronic obstructive pulmonary disease, unspecified: Secondary | ICD-10-CM | POA: Diagnosis not present

## 2022-08-27 ENCOUNTER — Telehealth (HOSPITAL_COMMUNITY): Payer: Self-pay | Admitting: *Deleted

## 2022-08-27 NOTE — Telephone Encounter (Signed)
Left message on voicemail per DPR in reference to upcoming appointment scheduled on [id number] with detailed instructions given per Myocardial Perfusion Study Information Sheet for the test. LM to arrive 15 minutes early, and that it is imperative to arrive on time for appointment to keep from having the test rescheduled. If you need to cancel or reschedule your appointment, please call the office within 24 hours of your appointment. Failure to do so may result in a cancellation of your appointment, and a $50 no show fee. Phone number given for call back for any questions. Crissie Figures, RN

## 2022-08-28 ENCOUNTER — Ambulatory Visit (HOSPITAL_COMMUNITY): Payer: Medicare Other | Attending: Internal Medicine

## 2022-08-28 ENCOUNTER — Encounter: Payer: Medicare Other | Admitting: Orthopaedic Surgery

## 2022-08-28 DIAGNOSIS — R079 Chest pain, unspecified: Secondary | ICD-10-CM

## 2022-08-28 LAB — MYOCARDIAL PERFUSION IMAGING
LV dias vol: 53 mL (ref 46–106)
LV sys vol: 11 mL
Nuc Stress EF: 79 %
Peak HR: 96 {beats}/min
Rest HR: 62 {beats}/min
Rest Nuclear Isotope Dose: 10.1 mCi
SDS: 2
SRS: 0
SSS: 2
ST Depression (mm): 0 mm
Stress Nuclear Isotope Dose: 32.7 mCi
TID: 0.98

## 2022-08-28 MED ORDER — TECHNETIUM TC 99M TETROFOSMIN IV KIT
32.7000 | PACK | Freq: Once | INTRAVENOUS | Status: AC | PRN
  Administered 2022-08-28: 32.7 via INTRAVENOUS

## 2022-08-28 MED ORDER — TECHNETIUM TC 99M TETROFOSMIN IV KIT
10.1000 | PACK | Freq: Once | INTRAVENOUS | Status: AC | PRN
  Administered 2022-08-28: 10.1 via INTRAVENOUS

## 2022-08-28 MED ORDER — REGADENOSON 0.4 MG/5ML IV SOLN
0.4000 mg | Freq: Once | INTRAVENOUS | Status: AC
  Administered 2022-08-28: 0.4 mg via INTRAVENOUS

## 2022-08-30 ENCOUNTER — Other Ambulatory Visit: Payer: Self-pay | Admitting: Internal Medicine

## 2022-09-05 ENCOUNTER — Emergency Department (HOSPITAL_COMMUNITY): Payer: 59

## 2022-09-05 ENCOUNTER — Other Ambulatory Visit: Payer: Self-pay

## 2022-09-05 ENCOUNTER — Telehealth: Payer: Self-pay | Admitting: Internal Medicine

## 2022-09-05 ENCOUNTER — Emergency Department (HOSPITAL_COMMUNITY)
Admission: EM | Admit: 2022-09-05 | Discharge: 2022-09-05 | Discharge status: Against Medical Advice | Payer: 59 | Attending: Emergency Medicine | Admitting: Emergency Medicine

## 2022-09-05 DIAGNOSIS — I10 Essential (primary) hypertension: Secondary | ICD-10-CM | POA: Diagnosis not present

## 2022-09-05 DIAGNOSIS — R519 Headache, unspecified: Secondary | ICD-10-CM | POA: Diagnosis present

## 2022-09-05 DIAGNOSIS — Z5321 Procedure and treatment not carried out due to patient leaving prior to being seen by health care provider: Secondary | ICD-10-CM | POA: Insufficient documentation

## 2022-09-05 LAB — CBC WITH DIFFERENTIAL/PLATELET
Abs Immature Granulocytes: 0.02 10*3/uL (ref 0.00–0.07)
Basophils Absolute: 0.1 10*3/uL (ref 0.0–0.1)
Basophils Relative: 1 %
Eosinophils Absolute: 0.2 10*3/uL (ref 0.0–0.5)
Eosinophils Relative: 2 %
HCT: 50.5 % — ABNORMAL HIGH (ref 36.0–46.0)
Hemoglobin: 16.5 g/dL — ABNORMAL HIGH (ref 12.0–15.0)
Immature Granulocytes: 0 %
Lymphocytes Relative: 34 %
Lymphs Abs: 3.3 10*3/uL (ref 0.7–4.0)
MCH: 31.8 pg (ref 26.0–34.0)
MCHC: 32.7 g/dL (ref 30.0–36.0)
MCV: 97.3 fL (ref 80.0–100.0)
Monocytes Absolute: 0.5 10*3/uL (ref 0.1–1.0)
Monocytes Relative: 6 %
Neutro Abs: 5.5 10*3/uL (ref 1.7–7.7)
Neutrophils Relative %: 57 %
Platelets: 254 10*3/uL (ref 150–400)
RBC: 5.19 MIL/uL — ABNORMAL HIGH (ref 3.87–5.11)
RDW: 13.2 % (ref 11.5–15.5)
WBC: 9.7 10*3/uL (ref 4.0–10.5)
nRBC: 0 % (ref 0.0–0.2)

## 2022-09-05 LAB — BASIC METABOLIC PANEL
Anion gap: 12 (ref 5–15)
BUN: 14 mg/dL (ref 8–23)
CO2: 27 mmol/L (ref 22–32)
Calcium: 9.7 mg/dL (ref 8.9–10.3)
Chloride: 102 mmol/L (ref 98–111)
Creatinine, Ser: 1.06 mg/dL — ABNORMAL HIGH (ref 0.44–1.00)
GFR, Estimated: 58 mL/min — ABNORMAL LOW (ref >60)
Glucose, Bld: 93 mg/dL (ref 70–99)
Potassium: 4.1 mmol/L (ref 3.5–5.1)
Sodium: 141 mmol/L (ref 135–145)

## 2022-09-05 MED ORDER — METOCLOPRAMIDE HCL 5 MG/ML IJ SOLN
10.0000 mg | Freq: Once | INTRAMUSCULAR | Status: AC
  Administered 2022-09-05: 10 mg via INTRAMUSCULAR
  Filled 2022-09-05: qty 2

## 2022-09-05 MED ORDER — DEXAMETHASONE SODIUM PHOSPHATE 4 MG/ML IJ SOLN
4.0000 mg | Freq: Once | INTRAMUSCULAR | Status: AC
  Administered 2022-09-05: 4 mg via INTRAMUSCULAR
  Filled 2022-09-05: qty 1

## 2022-09-05 MED ORDER — ACETAMINOPHEN 325 MG PO TABS
650.0000 mg | ORAL_TABLET | Freq: Once | ORAL | Status: AC
  Administered 2022-09-05: 650 mg via ORAL
  Filled 2022-09-05: qty 2

## 2022-09-05 NOTE — ED Notes (Signed)
Patient transported to x-ray. ?

## 2022-09-05 NOTE — ED Provider Triage Note (Addendum)
Emergency Medicine Provider Triage Evaluation Note  Jill Phillips , a 67 y.o. female  was evaluated in triage.  Pt complains of headache to top of head since this morning at 6am. Reports she gets bad headaches like this when her blood pressure is out of control so she took her blood pressure multiple times today with most reading systolic in 379K. No recent changes to anti-hypertensives. Compliant with her medications at home. Recent dyspnea on exertion but denies chest pain, nausea, vomiting, vision changes, or other current complaints. Normal stress test by cardiology last week.   Review of Systems  Positive: See HPI Negative: See HPI  Physical Exam  BP (!) 198/90 (BP Location: Left Arm)   Pulse 65   Temp 98.5 F (36.9 C) (Oral)   Resp 18   Ht '5\' 2"'$  (1.575 m)   Wt 68.5 kg   SpO2 94%   BMI 27.62 kg/m  Gen:   Awake, no distress   Resp:  Normal effort  MSK:   Moves extremities without difficulty  Other:  Equal 5/5 strength bilateral UE and LE, CNI, normal speech, alert and oriented  Medical Decision Making  Medically screening exam initiated at 7:51 PM.  Appropriate orders placed.  MIYU FENDERSON was informed that the remainder of the evaluation will be completed by another provider, this initial triage assessment does not replace that evaluation, and the importance of remaining in the ED until their evaluation is complete.  Pt requested treatment for her headache. Unfortunately, cannot do NSAIDs with history of chronic kidney disease. Offered headache cocktail with Reglan, Decadron, and Tylenol in place of NSAID and pt would like to try this.    Suzzette Righter, PA-C 09/05/22 1955    Suzzette Righter, PA-C 09/05/22 2025

## 2022-09-05 NOTE — Telephone Encounter (Signed)
For our records:  Pt called in with a head ache and stated that her blood pressure has been high today ranging between 195/90-217/95.   I merged in the triage nurse and recommended the pt seek immediate treatment at the ED or to call 911.

## 2022-09-05 NOTE — ED Triage Notes (Signed)
Pt has hx of hypertension- takes meds at home, but today BP was 887 systolic, and pt has a 19/59 headache

## 2022-09-06 NOTE — Telephone Encounter (Signed)
No acute abnormalities in tests that are concerning.  Can review more at a visit.    She should monitor her BP and schedule a f/u visit next week

## 2022-09-06 NOTE — Telephone Encounter (Signed)
PT calls today post ED visit. PT did let me know she did not stay for her whole visit, however she did end up receiving an x-ray, CT, and lab work.  PT informed me that several labs came up abnormal and she was advised to reach out to PCP to review these. She also stated that heart showed to be chronically deviated to the right but that PT was not given much information as to what this meant.   PT just really unsure as to next plan of action.   This morning's BP reading for her 179/100 and then lowered to 154/88. Stated that she had headaches as it got lower.  CB: [phone]

## 2022-09-06 NOTE — Telephone Encounter (Signed)
Spoke with patient and info given.  Appointment made for next Wednesday.

## 2022-09-10 ENCOUNTER — Encounter: Payer: Self-pay | Admitting: Internal Medicine

## 2022-09-10 NOTE — Progress Notes (Unsigned)
Subjective:    Patient ID: Jill Phillips, female    DOB: Jun 04, 1956, 67 y.o.   MRN: [id number]     HPI Jill Phillips is here for follow up of her chronic medical problems, including htn  BP elevated in ED 1/4 - 198/90, 179/100, 154/88 -- went for headache at top of head since 6am.  Received reglan and steroid injection.  This am she woke up with a bad headache and her BP was 224/94 - repeat 225/95.  Took hctz, later BP 158/86, later 173/76   Typically takes hctz 2-3 times a month  Left thumb pain - ? Trigger finger.   Rectal pain, vaginal pain, pelvic pain.  Wanted a referral to gyn.   Medications and allergies reviewed with patient and updated if appropriate.  Current Outpatient Medications on File Prior to Visit  Medication Sig Dispense Refill   acetaminophen (TYLENOL) 500 MG tablet Take 1,000 mg by mouth daily as needed for moderate pain or headache.     albuterol (VENTOLIN HFA) 108 (90 Base) MCG/ACT inhaler INHALE 2 PUFFS INTO THE LUNGS EVERY 6 HOURS AS NEEDED FOR WHEEZING OR SHORTNESS OF BREATH. 8.5 g 0   amLODipine-benazepril (LOTREL) 10-40 MG capsule TAKE 1 CAPSULE BY MOUTH DAILY. 90 capsule 0   atorvastatin (LIPITOR) 20 MG tablet TAKE 1 TABLET BY MOUTH DAILY. 90 tablet 1   clonazePAM (KLONOPIN) 1 MG tablet Take 1 tablet (1 mg total) by mouth at bedtime. 30 tablet 2   docusate sodium (COLACE) 100 MG capsule Take 200 mg by mouth at bedtime.     Efinaconazole 10 % SOLN Apply daily to affected nails for 48 weeks (Patient taking differently: Apply 1 Application topically daily as needed (nail fungus).) 8 mL 1   fentaNYL (DURAGESIC) 25 MCG/HR Place 1 patch onto the skin every 3 (three) days.     gabapentin (NEURONTIN) 400 MG capsule TAKE 1 CAPSULE BY MOUTH 3 TIMES DAILY. 270 capsule 0   hydrochlorothiazide (HYDRODIURIL) 12.5 MG tablet TAKE 1 TABLET (12.5 MG TOTAL) BY MOUTH DAILY. (Patient taking differently: Take 12.5 mg by mouth daily as needed (headache).) 30 tablet 3    HYDROcodone-acetaminophen (NORCO) 10-325 MG tablet Take 1 tablet by mouth every 6 (six) hours as needed for severe pain (Chronic back pain). (Patient taking differently: Take 1-4 tablets by mouth 3 (three) times daily as needed for moderate pain.) 84 tablet 0   levothyroxine (SYNTHROID) 88 MCG tablet TAKE 1 TABLET BY MOUTH DAILY. 90 tablet 1   Melatonin 10 MG TABS Take 60 mg by mouth at bedtime.     NARCAN 4 MG/0.1ML LIQD nasal spray kit Place 1 spray into the nose as needed (accidental overdose). 1 each 0   Nebivolol HCl 20 MG TABS TAKE 1 TABLET (20 MG TOTAL) BY MOUTH DAILY. 90 tablet 0   nicotine (NICODERM CQ - DOSED IN MG/24 HOURS) 21 mg/24hr patch Place 1 patch (21 mg total) onto the skin daily. 28 patch 1   omeprazole (PRILOSEC) 40 MG capsule TAKE 1 CAPSULE BY MOUTH 2 TIMES DAILY BEFORE A MEAL. 180 capsule 3   OVER THE COUNTER MEDICATION Take 2 capsules by mouth at bedtime. Seratame for Restful Legs     promethazine (PHENERGAN) 50 MG tablet Take 1 tablet (50 mg total) by mouth every 8 (eight) hours as needed for nausea or vomiting. 15 tablet 0   QUEtiapine (SEROQUEL) 200 MG tablet TAKE 1 TABLET (200 MG TOTAL) BY MOUTH AT BEDTIME. 90 tablet 1  senna (SENOKOT) 8.6 MG tablet Take 1 tablet by mouth daily.     solifenacin (VESICARE) 5 MG tablet Take 5 mg by mouth 2 (two) times daily.     SPIRIVA HANDIHALER 18 MCG inhalation capsule PLACE 1 CAPSULE INTO INHALER AND INHALE DAILY. 30 capsule 12   tiZANidine (ZANAFLEX) 4 MG tablet Take 1 tablet (4 mg total) by mouth every 8 (eight) hours as needed. 90 tablet 2   valACYclovir (VALTREX) 500 MG tablet TAKE 1 TABLET BY MOUTH DAILY. 90 tablet 1   venlafaxine XR (EFFEXOR-XR) 150 MG 24 hr capsule TAKE 2 CAPSULES BY MOUTH DAILY. 180 capsule 1   No current facility-administered medications on file prior to visit.     Review of Systems  Respiratory:  Negative for shortness of breath.   Cardiovascular:  Negative for chest pain, palpitations and leg  swelling.  Neurological:  Positive for headaches. Negative for dizziness and light-headedness.       Objective:   Vitals:   09/11/22 1347 09/11/22 1349  BP: (!) 148/92 (!) 150/92  Pulse: 68   Temp: 97.9 F (36.6 C)   SpO2: 97%    BP Readings from Last 3 Encounters:  09/11/22 (!) 150/92  09/05/22 (!) 198/90  08/12/22 120/62   Wt Readings from Last 3 Encounters:  09/11/22 150 lb (68 kg)  09/05/22 151 lb (68.5 kg)  08/28/22 151 lb (68.5 kg)   Body mass index is 27.44 kg/m.    Physical Exam Constitutional:      General: She is not in acute distress.    Appearance: Normal appearance.  HENT:     Head: Normocephalic and atraumatic.  Eyes:     Conjunctiva/sclera: Conjunctivae normal.  Cardiovascular:     Rate and Rhythm: Normal rate and regular rhythm.     Heart sounds: Normal heart sounds. No murmur heard. Pulmonary:     Effort: Pulmonary effort is normal. No respiratory distress.     Breath sounds: Normal breath sounds. No wheezing.  Musculoskeletal:     Cervical back: Neck supple.     Right lower leg: No edema.     Left lower leg: No edema.  Lymphadenopathy:     Cervical: No cervical adenopathy.  Skin:    General: Skin is warm and dry.     Findings: No rash.  Neurological:     Mental Status: She is alert. Mental status is at baseline.  Psychiatric:        Mood and Affect: Mood normal.        Behavior: Behavior normal.        Lab Results  Component Value Date   WBC 9.7 09/05/2022   HGB 16.5 (H) 09/05/2022   HCT 50.5 (H) 09/05/2022   PLT 254 09/05/2022   GLUCOSE 93 09/05/2022   CHOL 223 (H) 07/30/2022   TRIG 167.0 (H) 07/30/2022   HDL 50.00 07/30/2022   LDLDIRECT 110.0 07/16/2021   LDLCALC 139 (H) 07/30/2022   ALT 23 08/08/2022   AST 34 08/08/2022   NA 141 09/05/2022   K 4.1 09/05/2022   CL 102 09/05/2022   CREATININE 1.06 (H) 09/05/2022   BUN 14 09/05/2022   CO2 27 09/05/2022   TSH 2.26 07/30/2022   INR 0.87 05/26/2012   HGBA1C 6.3  07/30/2022   MICROALBUR 0.8 01/11/2022     Assessment & Plan:    See Problem List for Assessment and Plan of chronic medical problems.

## 2022-09-11 ENCOUNTER — Ambulatory Visit (INDEPENDENT_AMBULATORY_CARE_PROVIDER_SITE_OTHER): Payer: 59 | Admitting: Internal Medicine

## 2022-09-11 ENCOUNTER — Encounter: Payer: Self-pay | Admitting: Internal Medicine

## 2022-09-11 VITALS — BP 150/92 | HR 68 | Temp 97.9°F | Ht 62.0 in | Wt 150.0 lb

## 2022-09-11 DIAGNOSIS — G8929 Other chronic pain: Secondary | ICD-10-CM | POA: Diagnosis not present

## 2022-09-11 DIAGNOSIS — I10 Essential (primary) hypertension: Secondary | ICD-10-CM

## 2022-09-11 DIAGNOSIS — R102 Pelvic and perineal pain: Secondary | ICD-10-CM

## 2022-09-11 DIAGNOSIS — M79645 Pain in left finger(s): Secondary | ICD-10-CM

## 2022-09-11 MED ORDER — PROMETHAZINE HCL 50 MG PO TABS: 50.0000 mg | ORAL_TABLET | Freq: Three times a day (TID) | ORAL | 0 refills | Status: DC | PRN

## 2022-09-11 MED ORDER — CLONAZEPAM 1 MG PO TABS: 1.0000 mg | ORAL_TABLET | Freq: Every day | ORAL | 2 refills | Status: DC

## 2022-09-11 MED ORDER — HYDRALAZINE HCL 25 MG PO TABS: 25.0000 mg | ORAL_TABLET | Freq: Three times a day (TID) | ORAL | 5 refills | Status: DC

## 2022-09-11 MED ORDER — TIZANIDINE HCL 4 MG PO TABS: 4.0000 mg | ORAL_TABLET | Freq: Three times a day (TID) | ORAL | 2 refills | Status: DC | PRN

## 2022-09-11 NOTE — Assessment & Plan Note (Addendum)
Chronic BP not well controlled Continue amlodipine-benazepril 31-54 mg daily, bystolic 20 mg daily Continue hydrochlorothiazide 12.5 mg daily prn Start hydralazine 25 mg TID

## 2022-09-11 NOTE — Patient Instructions (Addendum)
       Medications changes include :   hydralazine 25 mg three times a day    A referral was ordered for [facility].     Someone will call you to schedule an appointment.    Return for follow up as scheduled, sooner if needed.

## 2022-09-11 NOTE — Assessment & Plan Note (Signed)
Acute Having pain in left thumb and it intermittently locks Referral ordered for ortho

## 2022-09-11 NOTE — Assessment & Plan Note (Signed)
Chronic Pain in pelvis, vagina, rectum Referral ordered for [facility] ob/gyn

## 2022-09-18 ENCOUNTER — Other Ambulatory Visit: Payer: Self-pay | Admitting: Internal Medicine

## 2022-09-23 ENCOUNTER — Ambulatory Visit (INDEPENDENT_AMBULATORY_CARE_PROVIDER_SITE_OTHER): Payer: 59 | Admitting: Orthopaedic Surgery

## 2022-09-23 ENCOUNTER — Encounter: Payer: Self-pay | Admitting: Orthopaedic Surgery

## 2022-09-23 DIAGNOSIS — M65312 Trigger thumb, left thumb: Secondary | ICD-10-CM | POA: Diagnosis not present

## 2022-09-23 NOTE — Progress Notes (Signed)
The patient is a 67 year old female who comes in with active triggering and pain with her left thumb.  She points to the A1 pulley as a source for pain and triggering.  This been going on for about 2 months and is very sore to her.  She denies any numbness and tingling.  We have replaced her right knee in the past.  She is a long-term patient of the clinic and has seen Dr. [name] for many years.  On examination of her left hand there is active triggering of the thumb.  There is pain over the A1 pulley.  The remainder of her hand exam is intact.  I explained the anatomy of the hand and the A1 pulley.  I recommended a steroid injection over the A1 pulley but she would rather proceed with surgery.  She has had painful injections in the past and it is reasonable to consider outpatient surgery.  I described the surgery involves.  This can be done under light sedation and just placing and local anesthesia over the area.  We would then see her back in 2 weeks after surgery for suture removal.  She can use her thumb that day and in 2 days and get it wet in the shower.  We will be in touch with scheduling her for a left thumb A1 pulley release.

## 2022-09-27 ENCOUNTER — Telehealth: Payer: Self-pay | Admitting: Internal Medicine

## 2022-09-27 NOTE — Telephone Encounter (Signed)
She can stop the hydralazine.  I am concerned about her blood pressure not being ideally controlled and further review imaging her kidneys.  She should monitor her blood pressure at home.  She is taking the promethazine a lot and I think she needs to take a URI.  Your long-term side effects from medication so I do not want to prescribe this long-term.  If her nausea is related to her pain medication she can ask her pain management doctor to prescribe it.  Let me know if he wants referral to GI for further evaluation of the nausea.

## 2022-09-27 NOTE — Telephone Encounter (Signed)
Patient called and said that she does not want to stay on the hydralazine - she said it is making her feel bad.  Made appointment for [place], 10/01/2022.  Patient also states that she needs her promethezine refilled  Pharmacy:  [facility]

## 2022-09-27 NOTE — Telephone Encounter (Signed)
Spoke with patient.  She declines referral right now and also following up with pain management.  She would like to discuss at next OV next Tuesday.

## 2022-09-30 ENCOUNTER — Telehealth: Payer: Self-pay | Admitting: *Deleted

## 2022-09-30 ENCOUNTER — Other Ambulatory Visit: Payer: Self-pay | Admitting: Internal Medicine

## 2022-09-30 NOTE — Telephone Encounter (Signed)
   Pre-operative Risk Assessment    Patient Name: Jill Phillips  DOB: Aug 18, 1956 MRN: [id number]      Request for Surgical Clearance    Procedure:   LEFT TRIGGER THUMB RELEASE  Date of Surgery:  Clearance TBD                                Surgeon:  DR. Jean Rosenthal Surgeon's Group or Practice Name:  [facility] Phone number:  [phone] Fax number:  [phone] ATTN: SHERRIE   Type of Clearance Requested:   - Medical ; NO MEDICATION LISTED AS NEEDED TO BE HELD   Type of Anesthesia:  MAC   Additional requests/questions:    Jill Phillips   09/30/2022, 1:52 PM

## 2022-09-30 NOTE — Telephone Encounter (Signed)
   Primary Cardiologist: Janina Mayo, MD  Chart reviewed as part of pre-operative protocol coverage. Given past medical history and time since last visit, based on ACC/AHA guidelines, Jill Phillips would be at acceptable risk for the planned procedure without further cardiovascular testing.   I spoke with patient and she reports no new or concerning cardiac symptoms. Patient was advised that if she develops new symptoms prior to surgery to contact our office to arrange a follow-up appointment. She verbalized understanding.  I will route this recommendation to the requesting party via Epic fax function and remove from pre-op pool.  Please call with questions.  Emmaline Life, NP-C  09/30/2022, 3:08 PM [address], [address] Office [phone] Fax [phone]

## 2022-10-01 ENCOUNTER — Ambulatory Visit: Payer: 59 | Admitting: Internal Medicine

## 2022-10-01 ENCOUNTER — Telehealth: Payer: Self-pay | Admitting: Internal Medicine

## 2022-10-01 NOTE — Telephone Encounter (Signed)
For our records:  We have received pre-op pw for the pt and it has been placed in Dr. Eilleen Kempf boxes.   Upon completion please fax to: [phone]

## 2022-10-01 NOTE — Telephone Encounter (Signed)
Clearance placed in Dr. Quay Burow folder for review.

## 2022-10-02 ENCOUNTER — Other Ambulatory Visit: Payer: Self-pay | Admitting: Internal Medicine

## 2022-10-02 ENCOUNTER — Ambulatory Visit: Payer: 59 | Admitting: Orthopedic Surgery

## 2022-10-09 ENCOUNTER — Encounter: Payer: Self-pay | Admitting: Obstetrics and Gynecology

## 2022-10-09 ENCOUNTER — Ambulatory Visit (INDEPENDENT_AMBULATORY_CARE_PROVIDER_SITE_OTHER): Payer: 59 | Admitting: Obstetrics and Gynecology

## 2022-10-09 VITALS — BP 130/84 | HR 77 | Ht 62.5 in | Wt 150.0 lb

## 2022-10-09 DIAGNOSIS — N952 Postmenopausal atrophic vaginitis: Secondary | ICD-10-CM | POA: Diagnosis not present

## 2022-10-09 DIAGNOSIS — B9689 Other specified bacterial agents as the cause of diseases classified elsewhere: Secondary | ICD-10-CM | POA: Diagnosis not present

## 2022-10-09 DIAGNOSIS — K6289 Other specified diseases of anus and rectum: Secondary | ICD-10-CM | POA: Diagnosis not present

## 2022-10-09 DIAGNOSIS — R102 Pelvic and perineal pain: Secondary | ICD-10-CM

## 2022-10-09 DIAGNOSIS — M6289 Other specified disorders of muscle: Secondary | ICD-10-CM

## 2022-10-09 DIAGNOSIS — Z1211 Encounter for screening for malignant neoplasm of colon: Secondary | ICD-10-CM

## 2022-10-09 DIAGNOSIS — N760 Acute vaginitis: Secondary | ICD-10-CM | POA: Diagnosis not present

## 2022-10-09 LAB — WET PREP FOR TRICH, YEAST, CLUE

## 2022-10-09 MED ORDER — ESTRADIOL 10 MCG VA TABS: ORAL_TABLET | VAGINAL | 0 refills | Status: DC

## 2022-10-09 MED ORDER — METRONIDAZOLE 0.75 % VA GEL: 1.0000 | Freq: Every day | VAGINAL | 0 refills | Status: DC

## 2022-10-09 NOTE — Progress Notes (Signed)
67 y.o. [id number]. Divorced White or Caucasian Not Hispanic or Latino female here for pelvic pain. She reports a 7 year h/o rectal pain and vaginal pain.    The vaginal pain is intermittent, can hurt all day one day and for 4-5 hours the next day. Can go 3-4 days without the pain. The pain can be stabbing or burning. No vaginal d/c, no odor.  The rectal pain is worse than the vaginal pain, stabbing/burning. No correlation to BM's. She has a BM every other day with metamucil or a vegetable laxative, also takes stool softeners. No pain to defecate.  No urinary c/o. No abdominal pain.   Not sexually active for over 10 years.   She has had 19 back surgeries. Has chronic back pain.  She is on gabapentin.   She had a hysterectomy, thinks she still has one ovary and one tube.  H/O IC. She tried pelvic floor PT 3 years ago. It didn't help her pain, but only did it for 2 weeks.   She reports seeing GI 2-3 years ago, she recalls being told that they don't deal with rectal pain.    H/O chronic kidney disease, creatinine last month was 1.06 with a GFR of 58 (improved).   H/O prediabetes, last HgbA1C was 6.3 (07/30/22).  H/O HTN, COPD.   No LMP recorded. Patient has had a hysterectomy.          Sexually active: No.  The current method of family planning is status post hysterectomy.    Exercising: No.  The patient does not participate in regular exercise at present. Smoker:  yes  Health Maintenance: Pap:  She states that she thinks it has been about 20 years ago.  History of abnormal Pap:  no MMG:  She is unsure when she last had a mammogram.  BMD:   05/27/2019 normal  Colonoscopy: 2018 f/u every 3 years  TDaP:  07/14/13 Gardasil: n/a   reports that she has been smoking cigarettes. She has a 45.00 pack-year smoking history. She has never used smokeless tobacco. She reports that she does not currently use drugs after having used the following drugs: Marijuana. She reports that she does not drink  alcohol.  Past Medical History:  Diagnosis Date   Active smoker    Anxiety    Calcifying tendinitis of shoulder    Chronic back pain    Chronic kidney disease    Chronic pain syndrome    COPD (chronic obstructive pulmonary disease) (HCC)    Dysthymic disorder    Emphysema lung (HCC)    GERD (gastroesophageal reflux disease)    Headache    "weekly maybe" (01/31/2016)   Heart murmur    for years, nothing to be concerned about   Herpes genitalia    History of blood transfusion 09/02/1978   related to "back surgery"   Hyperlipidemia    Hypertension    Hypothyroidism    Lumbago    Osteoarthrosis, unspecified whether generalized or localized, lower leg    Pain in joint, upper arm    Pneumothorax, left 01/31/2016   S/P Left posterior subcostal pain injection on 01/30/2016   PONV (postoperative nausea and vomiting)    gets nauseous  with longer surgery. Difficuty voiding after surgery   Postlaminectomy syndrome, thoracic region    Pre-diabetes    Primary localized osteoarthrosis, lower leg    Restless leg syndrome    Sleep apnea    s/p surgery- last sleep study 2011- doesnt use oxygen or machine  at night as instructed,.   12/2014- Dr Halford Chessman  reports it is negative.   Thyroid disease     Past Surgical History:  Procedure Laterality Date   APPENDECTOMY     BACK SURGERY     18 back surgeries (2 thoracic & 16 lumbar) (01/31/2016)   DILATION AND CURETTAGE OF UTERUS     HAMMER TOE SURGERY     IR RADIOLOGIST EVAL & MGMT  07/21/2017   JOINT REPLACEMENT     KNEE ARTHROSCOPY Right    LAPAROSCOPIC CHOLECYSTECTOMY     LUMBAR FUSION N/A 08/01/2015   Procedure: Right sided L1-2 and L2-3 transforaminal lumbar interbody fusion with cages, Extension of posterior fusion T12 to L3, Replaced pedicle screws bilaterally L1-L2 , Replaced left sided pedicle screws L-3. Instrumentation T12 to L3 using local bone graft, Vivigen allograft and cancellous chips;  Surgeon: [name], MD;  Location: [facility];  Service: Orthopedics;  Laterality: N/A;   LUMBAR LAMINECTOMY/DECOMPRESSION MICRODISCECTOMY N/A 01/28/2014   Procedure: Minimally Invasive Right  L1-2 Microdiscectomy;  Surgeon: [name], MD;  Location: [facility];  Service: Orthopedics;  Laterality: N/A;   REVERSE SHOULDER ARTHROPLASTY Left 09/09/2019   Procedure: LEFT REVERSE SHOULDER REPLACEMENT;  Surgeon: Meredith Pel, MD;  Location: [facility];  Service: Orthopedics;  Laterality: Left;   TOTAL HIP ARTHROPLASTY Right    TOTAL KNEE ARTHROPLASTY  05/29/2012   Procedure: TOTAL KNEE ARTHROPLASTY;  Surgeon: Mcarthur Rossetti, MD;  Location: [facility];  Service: Orthopedics;  Laterality: Right;  Right Total Knee Arthroplasty   TUBAL LIGATION     UVULOPALATOPHARYNGOPLASTY     VAGINAL HYSTERECTOMY      Current Outpatient Medications  Medication Sig Dispense Refill   acetaminophen (TYLENOL) 500 MG tablet Take 1,000 mg by mouth daily as needed for moderate pain or headache.     albuterol (VENTOLIN HFA) 108 (90 Base) MCG/ACT inhaler INHALE 2 PUFFS INTO THE LUNGS EVERY 6 HOURS AS NEEDED FOR WHEEZING OR SHORTNESS OF BREATH. 8.5 g 0   amLODipine-benazepril (LOTREL) 10-40 MG capsule TAKE 1 CAPSULE BY MOUTH DAILY. 90 capsule 0   atorvastatin (LIPITOR) 20 MG tablet TAKE 1 TABLET BY MOUTH DAILY. 90 tablet 1   clonazePAM (KLONOPIN) 1 MG tablet Take 1 tablet (1 mg total) by mouth at bedtime. 30 tablet 2   docusate sodium (COLACE) 100 MG capsule Take 200 mg by mouth at bedtime.     fentaNYL (DURAGESIC) 25 MCG/HR Place 1 patch onto the skin every 3 (three) days.     gabapentin (NEURONTIN) 400 MG capsule TAKE 1 CAPSULE BY MOUTH 3 TIMES DAILY. 270 capsule 0   hydrochlorothiazide (HYDRODIURIL) 12.5 MG tablet TAKE 1 TABLET (12.5 MG TOTAL) BY MOUTH DAILY. (Patient taking differently: Take 12.5 mg by mouth daily as needed (headache).) 30 tablet 3   HYDROcodone-acetaminophen (NORCO) 10-325 MG tablet Take 1 tablet by mouth every 6 (six) hours as needed for severe  pain (Chronic back pain). (Patient taking differently: Take 1-4 tablets by mouth 3 (three) times daily as needed for moderate pain.) 84 tablet 0   levothyroxine (SYNTHROID) 88 MCG tablet TAKE 1 TABLET BY MOUTH DAILY. 90 tablet 1   Melatonin 10 MG TABS Take 60 mg by mouth at bedtime.     NARCAN 4 MG/0.1ML LIQD nasal spray kit Place 1 spray into the nose as needed (accidental overdose). 1 each 0   Nebivolol HCl 20 MG TABS TAKE 1 TABLET (20 MG TOTAL) BY MOUTH DAILY. 90 tablet 0  nicotine (NICODERM CQ - DOSED IN MG/24 HOURS) 21 mg/24hr patch Place 1 patch (21 mg total) onto the skin daily. 28 patch 1   omeprazole (PRILOSEC) 40 MG capsule TAKE 1 CAPSULE BY MOUTH 2 TIMES DAILY BEFORE A MEAL. 180 capsule 3   QUEtiapine (SEROQUEL) 200 MG tablet TAKE 1 TABLET (200 MG TOTAL) BY MOUTH AT BEDTIME. 90 tablet 1   senna (SENOKOT) 8.6 MG tablet Take 1 tablet by mouth daily.     solifenacin (VESICARE) 5 MG tablet Take 5 mg by mouth 2 (two) times daily.     SPIRIVA HANDIHALER 18 MCG inhalation capsule PLACE 1 CAPSULE INTO INHALER AND INHALE DAILY. 30 capsule 12   tiZANidine (ZANAFLEX) 4 MG tablet Take 1 tablet (4 mg total) by mouth every 8 (eight) hours as needed. 90 tablet 2   valACYclovir (VALTREX) 500 MG tablet TAKE 1 TABLET BY MOUTH DAILY. 90 tablet 1   venlafaxine XR (EFFEXOR-XR) 150 MG 24 hr capsule TAKE 2 CAPSULES BY MOUTH DAILY. 180 capsule 1   No current facility-administered medications for this visit.    Family History  Problem Relation Age of Onset   Kidney disease Mother    Heart disease Father    Heart disease Sister 61       s/p CABG   Hypertension Sister    Anuerysm Brother 61       brain   Heart disease Brother    Colon cancer Neg Hx    Breast cancer Neg Hx    Ovarian cancer Neg Hx     Review of Systems  Genitourinary:  Positive for pelvic pain and vaginal pain.    Exam:   BP 130/84   Pulse 77   Ht 5' 2.5" (1.588 m)   Wt 150 lb (68 kg)   SpO2 100%   BMI 27.00 kg/m   Weight  change: '@WEIGHTCHANGE'$ @ Height:   Height: 5' 2.5" (158.8 cm)  Ht Readings from Last 3 Encounters:  10/09/22 5' 2.5" (1.588 m)  09/11/22 '5\' 2"'$  (1.575 m)  09/05/22 '5\' 2"'$  (1.575 m)    General appearance: alert, cooperative and appears stated age Abdomen: soft, non-tender; non distended,  no masses,  no organomegaly   Pelvic: External genitalia:  no lesions              Urethra:  normal appearing urethra with no masses, tenderness or lesions              Bartholins and Skenes: normal                 Vagina:atrophic appearing vagina with normal color, no discharge, no lesions              Cervix: absent               Bimanual Exam:  Uterus:  uterus absent              Adnexa:  no masses, mildly tender               Rectovaginal: Confirms               Anus:  normal sphincter tone, no lesions  Bladder: mildly tender  Pelvic floor: mildly tender  Gae Dry, CMA chaperoned for the exam.  1. Vaginal pain She has vaginal atrophy, BV, tender pelvic floor muscles and it sounds like she is having vaginismus and levator spasms. She has had multiple back surgery and could certainly have nerve issues. She is  already on gabapentin.  - Ambulatory referral to Physical Therapy -Will treat BV -Will start vaginal estrogen for treatment of atrophy (no contraindication). She must get a mammogram, # given to schedule. -Will have her return in 1-2 month for f/u and a breast and pelvic exam.   2. Vaginal atrophy - Estradiol 10 MCG TABS vaginal tablet; Place one tablet vaginally qhs x 1 week, then change to 2 x a week.  Dispense: 24 tablet; Refill: 0 - Ambulatory referral to Physical Therapy  3. Bacterial vaginitis - metroNIDAZOLE (METROGEL) 0.75 % vaginal gel; Place 1 Applicatorful vaginally at bedtime. Use nightly for 5 nights.  Dispense: 70 g; Refill: 0  4. Rectal pain - Ambulatory referral to Physical Therapy - Ambulatory referral to Gastroenterology  5. Pelvic floor dysfunction - Ambulatory  referral to Physical Therapy. I encouraged her to try and give it time to work.   6. Colon cancer screening Overdue for a colonoscopy - Ambulatory referral to Gastroenterology

## 2022-10-09 NOTE — Addendum Note (Signed)
Addended by: Dorothy Spark on: 10/09/2022 02:07 PM   Modules accepted: Orders

## 2022-10-10 ENCOUNTER — Other Ambulatory Visit: Payer: Self-pay | Admitting: Internal Medicine

## 2022-10-14 ENCOUNTER — Other Ambulatory Visit: Payer: Self-pay | Admitting: Obstetrics and Gynecology

## 2022-10-14 ENCOUNTER — Other Ambulatory Visit: Payer: Self-pay

## 2022-10-14 ENCOUNTER — Telehealth: Payer: Self-pay

## 2022-10-14 DIAGNOSIS — G2581 Restless legs syndrome: Secondary | ICD-10-CM | POA: Diagnosis not present

## 2022-10-14 DIAGNOSIS — M545 Low back pain, unspecified: Secondary | ICD-10-CM | POA: Diagnosis not present

## 2022-10-14 DIAGNOSIS — J449 Chronic obstructive pulmonary disease, unspecified: Secondary | ICD-10-CM | POA: Diagnosis not present

## 2022-10-14 DIAGNOSIS — J439 Emphysema, unspecified: Secondary | ICD-10-CM | POA: Diagnosis not present

## 2022-10-14 DIAGNOSIS — Z79899 Other long term (current) drug therapy: Secondary | ICD-10-CM | POA: Diagnosis not present

## 2022-10-14 MED ORDER — TINIDAZOLE 500 MG PO TABS: ORAL_TABLET | ORAL | 0 refills | Status: DC

## 2022-10-14 NOTE — Telephone Encounter (Signed)
Ok to stop the Metrogel.   An alternative treatment for bacterial vaginosis is Tinidazole 1 gram orally for 5 days.   Side effects that occur in a very small percentage of patients may be metallic taste in the mouth, nausea, or weakness.

## 2022-10-14 NOTE — Telephone Encounter (Signed)
Spoke with patient and she agreed with Tindazole. We discussed possible side affects in a small percentage of patients.  When I went to sign Rx there was a pop up warning. I will route to Dr. Quincy Simmonds to sign off on Rx if ok.

## 2022-10-14 NOTE — Progress Notes (Signed)
Rx to pharmacy for Tinidazole.

## 2022-10-14 NOTE — Telephone Encounter (Signed)
Patient called stating that she does not want to finish the Metrogel Vaginal that Dr. Talbert Nan prescribed on 10/09/22 for BV and she wants to know If she needs different Rx.  She states she was applying the gel in standing position on Saturday night and said her bladder immediately starting hurting. She questions if maybe she hit her bladder with applicator. She states she has 6 year history of bladder pain and takes AZO prn and has 2 "bladder pills" she takes every morning.  She said the pain feels some improved this morning. No UTI sx other than bladder pain. She also has been diagnosed with interstitial cystitis.

## 2022-10-14 NOTE — Telephone Encounter (Signed)
I sent in the prescription to her pharmacy.   The pop up appeared as Flagyl is related to Tinidazole.  The Tinidazole is less likely to cause GI side effects.

## 2022-10-17 ENCOUNTER — Other Ambulatory Visit: Payer: Self-pay | Admitting: Orthopaedic Surgery

## 2022-10-17 DIAGNOSIS — M65312 Trigger thumb, left thumb: Secondary | ICD-10-CM | POA: Diagnosis not present

## 2022-10-18 ENCOUNTER — Telehealth: Payer: Self-pay | Admitting: Radiology

## 2022-10-18 NOTE — Telephone Encounter (Signed)
Patient called stating she had surgery yesterday and has had some pain, but now is experiencing numbness in her entire hand and between all of the fingers. She does have some swelling, and we discussed elevating the hand higher than her heart to help with that. She would like to know if there is anything else that you recommend or if she should be concerned?  Please advise. CB [phone]

## 2022-10-26 DIAGNOSIS — J449 Chronic obstructive pulmonary disease, unspecified: Secondary | ICD-10-CM | POA: Diagnosis not present

## 2022-10-26 DIAGNOSIS — R0602 Shortness of breath: Secondary | ICD-10-CM | POA: Diagnosis not present

## 2022-10-31 ENCOUNTER — Encounter: Payer: Self-pay | Admitting: Physician Assistant

## 2022-10-31 ENCOUNTER — Ambulatory Visit (INDEPENDENT_AMBULATORY_CARE_PROVIDER_SITE_OTHER): Payer: 59 | Admitting: Physician Assistant

## 2022-10-31 DIAGNOSIS — M65312 Trigger thumb, left thumb: Secondary | ICD-10-CM

## 2022-10-31 NOTE — Progress Notes (Signed)
HPI: Jill Phillips returns today for follow-up of her left trigger thumb release which was performed on 10/17/2022.  She states she has some pain in the thumb at times.  Notes some swelling but is overall better and no longer triggering.  She has had no drainage from the incision.  No fevers chills.  Left thumb: Good range of motion left thumb.  Surgical incisions well-healed approximated with interrupted nylon sutures.  No drainage no signs of infection.   Impression: Status post left trigger thumb release  Plan: Sutures harvested.  She will work on scar tissue mobilization.  Follow-up with [place] if she has any questions concerns or any signs of infection.  Questions were encouraged and answered at length

## 2022-11-01 ENCOUNTER — Telehealth: Payer: Self-pay | Admitting: Obstetrics and Gynecology

## 2022-11-01 NOTE — Telephone Encounter (Signed)
Inbound fax from [facility] stating pt has not responded to their multiple attempts to get in contact with her to schedule an appt.  Please advise.

## 2022-11-04 ENCOUNTER — Telehealth: Payer: Self-pay | Admitting: Internal Medicine

## 2022-11-04 MED ORDER — CLONAZEPAM 1 MG PO TABS: 1.0000 mg | ORAL_TABLET | Freq: Every day | ORAL | 2 refills | Status: DC

## 2022-11-04 NOTE — Telephone Encounter (Signed)
Rx sent but insurance may not allow early refill - she is due thursday

## 2022-11-04 NOTE — Telephone Encounter (Signed)
Pt called wanted to know if she can get Rx refilled early.  clonazePAM (KLONOPIN) 1 MG tablet  Preferred Pharmacy    [facility], [place]

## 2022-11-05 NOTE — Telephone Encounter (Signed)
Please reach out to the patient and see if she wants [place] to cancel the referral.

## 2022-11-07 ENCOUNTER — Encounter: Payer: Self-pay | Admitting: Radiology

## 2022-11-07 DIAGNOSIS — J439 Emphysema, unspecified: Secondary | ICD-10-CM | POA: Diagnosis not present

## 2022-11-07 DIAGNOSIS — M545 Low back pain, unspecified: Secondary | ICD-10-CM | POA: Diagnosis not present

## 2022-11-07 DIAGNOSIS — J449 Chronic obstructive pulmonary disease, unspecified: Secondary | ICD-10-CM | POA: Diagnosis not present

## 2022-11-07 DIAGNOSIS — G2581 Restless legs syndrome: Secondary | ICD-10-CM | POA: Diagnosis not present

## 2022-11-07 DIAGNOSIS — Z79899 Other long term (current) drug therapy: Secondary | ICD-10-CM | POA: Diagnosis not present

## 2022-11-11 DIAGNOSIS — Z79899 Other long term (current) drug therapy: Secondary | ICD-10-CM | POA: Diagnosis not present

## 2022-11-13 NOTE — Telephone Encounter (Signed)
VM left for pt to return call with no success.  Referral closed.

## 2022-11-24 DIAGNOSIS — R0602 Shortness of breath: Secondary | ICD-10-CM | POA: Diagnosis not present

## 2022-11-24 DIAGNOSIS — J449 Chronic obstructive pulmonary disease, unspecified: Secondary | ICD-10-CM | POA: Diagnosis not present

## 2022-12-04 NOTE — Progress Notes (Deleted)
67 y.o. G55P2002 Divorced White or Caucasian Not Hispanic or Latino female here for annual exam.      No LMP recorded. Patient has had a hysterectomy.          Sexually active: {yes no:314532}  The current method of family planning is status post hysterectomy.    Exercising: {yes no:314532}  {types:19826} Smoker:  {YES NO:22349}  Health Maintenance: Pap:  20 years ago. H/O hysterectomy  History of abnormal Pap:  {YES NO:22349} MMG:  *** BMD:   05/27/2019 normal  Colonoscopy: 2018 f/u every 3 years *** TDaP:  07/14/13 Gardasil: n/a   reports that she has been smoking cigarettes. She has a 45.00 pack-year smoking history. She has never used smokeless tobacco. She reports that she does not currently use drugs after having used the following drugs: Marijuana. She reports that she does not drink alcohol.  Past Medical History:  Diagnosis Date   Active smoker    Anxiety    Calcifying tendinitis of shoulder    Chronic back pain    Chronic kidney disease    Chronic pain syndrome    COPD (chronic obstructive pulmonary disease) (HCC)    Dysthymic disorder    Emphysema lung (HCC)    GERD (gastroesophageal reflux disease)    Headache    "weekly maybe" (01/31/2016)   Heart murmur    for years, nothing to be concerned about   Herpes genitalia    History of blood transfusion 09/02/1978   related to "back surgery"   Hyperlipidemia    Hypertension    Hypothyroidism    Lumbago    Osteoarthrosis, unspecified whether generalized or localized, lower leg    Pain in joint, upper arm    Pneumothorax, left 01/31/2016   S/P Left posterior subcostal pain injection on 01/30/2016   PONV (postoperative nausea and vomiting)    gets nauseous  with longer surgery. Difficuty voiding after surgery   Postlaminectomy syndrome, thoracic region    Pre-diabetes    Primary localized osteoarthrosis, lower leg    Restless leg syndrome    Sleep apnea    s/p surgery- last sleep study 2011- doesnt use oxygen or  machine at night as instructed,.   12/2014- Dr Halford Chessman  reports it is negative.   Thyroid disease     Past Surgical History:  Procedure Laterality Date   APPENDECTOMY     BACK SURGERY     18 back surgeries (2 thoracic & 16 lumbar) (01/31/2016)   DILATION AND CURETTAGE OF UTERUS     HAMMER TOE SURGERY     IR RADIOLOGIST EVAL & MGMT  07/21/2017   JOINT REPLACEMENT     KNEE ARTHROSCOPY Right    LAPAROSCOPIC CHOLECYSTECTOMY     LUMBAR FUSION N/A 08/01/2015   Procedure: Right sided L1-2 and L2-3 transforaminal lumbar interbody fusion with cages, Extension of posterior fusion T12 to L3, Replaced pedicle screws bilaterally L1-L2 , Replaced left sided pedicle screws L-3. Instrumentation T12 to L3 using local bone graft, Vivigen allograft and cancellous chips;  Surgeon: [name], MD;  Location: [facility];  Service: Orthopedics;  Laterality: N/A;   LUMBAR LAMINECTOMY/DECOMPRESSION MICRODISCECTOMY N/A 01/28/2014   Procedure: Minimally Invasive Right  L1-2 Microdiscectomy;  Surgeon: [name], MD;  Location: [facility];  Service: Orthopedics;  Laterality: N/A;   REVERSE SHOULDER ARTHROPLASTY Left 09/09/2019   Procedure: LEFT REVERSE SHOULDER REPLACEMENT;  Surgeon: Meredith Pel, MD;  Location: [facility];  Service: Orthopedics;  Laterality: Left;   TOTAL  HIP ARTHROPLASTY Right    TOTAL KNEE ARTHROPLASTY  05/29/2012   Procedure: TOTAL KNEE ARTHROPLASTY;  Surgeon: Mcarthur Rossetti, MD;  Location: [facility];  Service: Orthopedics;  Laterality: Right;  Right Total Knee Arthroplasty   TUBAL LIGATION     UVULOPALATOPHARYNGOPLASTY     VAGINAL HYSTERECTOMY      Current Outpatient Medications  Medication Sig Dispense Refill   acetaminophen (TYLENOL) 500 MG tablet Take 1,000 mg by mouth daily as needed for moderate pain or headache.     albuterol (VENTOLIN HFA) 108 (90 Base) MCG/ACT inhaler INHALE 2 PUFFS INTO THE LUNGS EVERY 6 HOURS AS NEEDED FOR WHEEZING OR SHORTNESS OF BREATH. 8.5 g 0    amLODipine-benazepril (LOTREL) 10-40 MG capsule TAKE 1 CAPSULE BY MOUTH DAILY. 90 capsule 0   atorvastatin (LIPITOR) 20 MG tablet TAKE 1 TABLET BY MOUTH DAILY. 90 tablet 1   clonazePAM (KLONOPIN) 1 MG tablet Take 1 tablet (1 mg total) by mouth at bedtime. 30 tablet 2   docusate sodium (COLACE) 100 MG capsule Take 200 mg by mouth at bedtime.     Estradiol 10 MCG TABS vaginal tablet Place one tablet vaginally qhs x 1 week, then change to 2 x a week. 24 tablet 0   fentaNYL (DURAGESIC) 25 MCG/HR Place 1 patch onto the skin every 3 (three) days.     gabapentin (NEURONTIN) 400 MG capsule TAKE 1 CAPSULE BY MOUTH 3 TIMES DAILY. 270 capsule 0   hydrochlorothiazide (HYDRODIURIL) 12.5 MG tablet TAKE 1 TABLET (12.5 MG TOTAL) BY MOUTH DAILY. (Patient taking differently: Take 12.5 mg by mouth daily as needed (headache).) 30 tablet 3   HYDROcodone-acetaminophen (NORCO) 10-325 MG tablet Take 1 tablet by mouth every 6 (six) hours as needed for severe pain (Chronic back pain). (Patient taking differently: Take 1-4 tablets by mouth 3 (three) times daily as needed for moderate pain.) 84 tablet 0   levothyroxine (SYNTHROID) 88 MCG tablet TAKE 1 TABLET BY MOUTH DAILY. 90 tablet 1   Melatonin 10 MG TABS Take 60 mg by mouth at bedtime.     NARCAN 4 MG/0.1ML LIQD nasal spray kit Place 1 spray into the nose as needed (accidental overdose). 1 each 0   Nebivolol HCl 20 MG TABS TAKE 1 TABLET BY MOUTH DAILY. 90 tablet 0   nicotine (NICODERM CQ - DOSED IN MG/24 HOURS) 21 mg/24hr patch Place 1 patch (21 mg total) onto the skin daily. 28 patch 1   omeprazole (PRILOSEC) 40 MG capsule TAKE 1 CAPSULE BY MOUTH 2 TIMES DAILY BEFORE A MEAL. 180 capsule 3   QUEtiapine (SEROQUEL) 200 MG tablet TAKE 1 TABLET (200 MG TOTAL) BY MOUTH AT BEDTIME. 90 tablet 1   senna (SENOKOT) 8.6 MG tablet Take 1 tablet by mouth daily.     solifenacin (VESICARE) 5 MG tablet Take 5 mg by mouth 2 (two) times daily.     SPIRIVA HANDIHALER 18 MCG inhalation  capsule PLACE 1 CAPSULE INTO INHALER AND INHALE DAILY. 30 capsule 12   tinidazole (TINDAMAX) 500 MG tablet Take 2 tablets (1000 mg) by mouth daily for 5 days. 10 tablet 0   tiZANidine (ZANAFLEX) 4 MG tablet Take 1 tablet (4 mg total) by mouth every 8 (eight) hours as needed. 90 tablet 2   valACYclovir (VALTREX) 500 MG tablet TAKE 1 TABLET BY MOUTH DAILY. 90 tablet 1   venlafaxine XR (EFFEXOR-XR) 150 MG 24 hr capsule TAKE 2 CAPSULES BY MOUTH DAILY. 180 capsule 1   No current facility-administered medications  for this visit.    Family History  Problem Relation Age of Onset   Kidney disease Mother    Heart disease Father    Heart disease Sister 72       s/p CABG   Hypertension Sister    Anuerysm Brother 108       brain   Heart disease Brother    Colon cancer Neg Hx    Breast cancer Neg Hx    Ovarian cancer Neg Hx     Review of Systems  Exam:   There were no vitals taken for this visit.  Weight change: @WEIGHTCHANGE @ Height:      Ht Readings from Last 3 Encounters:  10/09/22 5' 2.5" (1.588 m)  09/11/22 5\' 2"  (1.575 m)  09/05/22 5\' 2"  (1.575 m)    General appearance: alert, cooperative and appears stated age Head: Normocephalic, without obvious abnormality, atraumatic Neck: no adenopathy, supple, symmetrical, trachea midline and thyroid {CHL AMB PHY EX THYROID NORM [phone]::"normal to inspection and palpation"} Lungs: clear to auscultation bilaterally Cardiovascular: regular rate and rhythm Breasts: {Exam; breast:13139::"normal appearance, no masses or tenderness"} Abdomen: soft, non-tender; non distended,  no masses,  no organomegaly Extremities: extremities normal, atraumatic, no cyanosis or edema Skin: Skin color, texture, turgor normal. No rashes or lesions Lymph nodes: Cervical, supraclavicular, and axillary nodes normal. No abnormal inguinal nodes palpated Neurologic: Grossly normal   Pelvic: External genitalia:  no lesions              Urethra:  normal  appearing urethra with no masses, tenderness or lesions              Bartholins and Skenes: normal                 Vagina: normal appearing vagina with normal color and discharge, no lesions              Cervix: {CHL AMB PHY EX CERVIX NORM DEFAULT:[phone]::"no lesions"}               Bimanual Exam:  Uterus:  {CHL AMB PHY EX UTERUS NORM DEFAULT:[phone]::"normal size, contour, position, consistency, mobility, non-tender"}              Adnexa: {CHL AMB PHY EX ADNEXA NO MASS DEFAULT:[phone]::"no mass, fullness, tenderness"}               Rectovaginal: Confirms               Anus:  normal sphincter tone, no lesions  *** chaperoned for the exam.  A:  Well Woman with normal exam  P:

## 2022-12-13 ENCOUNTER — Other Ambulatory Visit: Payer: Self-pay

## 2022-12-13 ENCOUNTER — Encounter (HOSPITAL_COMMUNITY): Payer: Self-pay

## 2022-12-13 ENCOUNTER — Emergency Department (HOSPITAL_COMMUNITY)
Admission: EM | Admit: 2022-12-13 | Discharge: 2022-12-14 | Disposition: A | Discharge status: Home / Self Care | Payer: 59 | Attending: Student | Admitting: Student

## 2022-12-13 ENCOUNTER — Other Ambulatory Visit: Payer: Self-pay | Admitting: Obstetrics and Gynecology

## 2022-12-13 DIAGNOSIS — M545 Low back pain, unspecified: Secondary | ICD-10-CM | POA: Diagnosis not present

## 2022-12-13 DIAGNOSIS — R39198 Other difficulties with micturition: Secondary | ICD-10-CM | POA: Diagnosis not present

## 2022-12-13 DIAGNOSIS — Z79899 Other long term (current) drug therapy: Secondary | ICD-10-CM | POA: Diagnosis not present

## 2022-12-13 DIAGNOSIS — Z5321 Procedure and treatment not carried out due to patient leaving prior to being seen by health care provider: Secondary | ICD-10-CM | POA: Insufficient documentation

## 2022-12-13 DIAGNOSIS — G2581 Restless legs syndrome: Secondary | ICD-10-CM | POA: Diagnosis not present

## 2022-12-13 DIAGNOSIS — J449 Chronic obstructive pulmonary disease, unspecified: Secondary | ICD-10-CM | POA: Diagnosis not present

## 2022-12-13 DIAGNOSIS — N952 Postmenopausal atrophic vaginitis: Secondary | ICD-10-CM

## 2022-12-13 DIAGNOSIS — J439 Emphysema, unspecified: Secondary | ICD-10-CM | POA: Diagnosis not present

## 2022-12-13 LAB — CBC WITH DIFFERENTIAL/PLATELET
Abs Immature Granulocytes: 0.02 10*3/uL (ref 0.00–0.07)
Basophils Absolute: 0 10*3/uL (ref 0.0–0.1)
Basophils Relative: 0 %
Eosinophils Absolute: 0.2 10*3/uL (ref 0.0–0.5)
Eosinophils Relative: 2 %
HCT: 43.1 % (ref 36.0–46.0)
Hemoglobin: 14.1 g/dL (ref 12.0–15.0)
Immature Granulocytes: 0 %
Lymphocytes Relative: 31 %
Lymphs Abs: 3 10*3/uL (ref 0.7–4.0)
MCH: 32.9 pg (ref 26.0–34.0)
MCHC: 32.7 g/dL (ref 30.0–36.0)
MCV: 100.7 fL — ABNORMAL HIGH (ref 80.0–100.0)
Monocytes Absolute: 0.6 10*3/uL (ref 0.1–1.0)
Monocytes Relative: 6 %
Neutro Abs: 5.8 10*3/uL (ref 1.7–7.7)
Neutrophils Relative %: 61 %
Platelets: 207 10*3/uL (ref 150–400)
RBC: 4.28 MIL/uL (ref 3.87–5.11)
RDW: 14.1 % (ref 11.5–15.5)
WBC: 9.6 10*3/uL (ref 4.0–10.5)
nRBC: 0 % (ref 0.0–0.2)

## 2022-12-13 LAB — BASIC METABOLIC PANEL
Anion gap: 8 (ref 5–15)
BUN: 27 mg/dL — ABNORMAL HIGH (ref 8–23)
CO2: 23 mmol/L (ref 22–32)
Calcium: 8.9 mg/dL (ref 8.9–10.3)
Chloride: 104 mmol/L (ref 98–111)
Creatinine, Ser: 1.64 mg/dL — ABNORMAL HIGH (ref 0.44–1.00)
GFR, Estimated: 34 mL/min — ABNORMAL LOW (ref >60)
Glucose, Bld: 62 mg/dL — ABNORMAL LOW (ref 70–99)
Potassium: 4.4 mmol/L (ref 3.5–5.1)
Sodium: 135 mmol/L (ref 135–145)

## 2022-12-13 NOTE — ED Notes (Signed)
Pt stated they were leaving AMA to come back another time due to wait

## 2022-12-13 NOTE — ED Provider Triage Note (Signed)
Emergency Medicine Provider Triage Evaluation Note  Jill Phillips , a 67 y.o. female  was evaluated in triage.  Patient complains of difficulty urinating.  She reports that she had renal failure a year and a half ago and she is concerned about her kidneys.  She saw eye doctor outpatient and was barely able to get any urine yesterday and is continuing into today.  No pain  Review of Systems  Positive:  Negative:   Physical Exam  There were no vitals taken for this visit. Gen:   Awake, no distress   Resp:  Normal effort  MSK:   Moves extremities without difficulty  Other:    Medical Decision Making  Medically screening exam initiated at 5:54 PM.  Appropriate orders placed.  Jill Phillips was informed that the remainder of the evaluation will be completed by another provider, this initial triage assessment does not replace that evaluation, and the importance of remaining in the ED until their evaluation is complete.     Saddie Benders, PA-C 12/13/22 1755

## 2022-12-13 NOTE — ED Triage Notes (Signed)
Pt came in via POV d/t pt stating she is having kidney problems, noticing the last 3 days she has noticed that she has not been making much urine. A/Ox4, rates chronic back pain 4/10 while in triage.

## 2022-12-16 NOTE — Telephone Encounter (Signed)
Med refill request:estradiol 10 mcg tab vaginally Last AEX: OV 10/09/22 -JJ Next AEX: 12/17/22 -JJ Last MMG (if hormonal med) None documented. Was advised at last OV MMG needed.   Refill authorized: Please Advise?

## 2022-12-17 ENCOUNTER — Encounter: Payer: Self-pay | Admitting: Internal Medicine

## 2022-12-17 ENCOUNTER — Ambulatory Visit: Payer: 59 | Admitting: Obstetrics and Gynecology

## 2022-12-17 DIAGNOSIS — Z79899 Other long term (current) drug therapy: Secondary | ICD-10-CM | POA: Diagnosis not present

## 2022-12-17 NOTE — Telephone Encounter (Signed)
She needs a mammogram prior to refills

## 2022-12-17 NOTE — Progress Notes (Unsigned)
Subjective:    Patient ID: Jill Phillips, female    DOB: [birth year], 67 y.o.   MRN: [id number]      HPI Jill Phillips is here for No chief complaint on file.    Not feeling well -     Medications and allergies reviewed with patient and updated if appropriate.  Current Outpatient Medications on File Prior to Visit  Medication Sig Dispense Refill   acetaminophen (TYLENOL) 500 MG tablet Take 1,000 mg by mouth daily as needed for moderate pain or headache.     albuterol (VENTOLIN HFA) 108 (90 Base) MCG/ACT inhaler INHALE 2 PUFFS INTO THE LUNGS EVERY 6 HOURS AS NEEDED FOR WHEEZING OR SHORTNESS OF BREATH. 8.5 g 0   amLODipine-benazepril (LOTREL) 10-40 MG capsule TAKE 1 CAPSULE BY MOUTH DAILY. 90 capsule 0   atorvastatin (LIPITOR) 20 MG tablet TAKE 1 TABLET BY MOUTH DAILY. 90 tablet 1   clonazePAM (KLONOPIN) 1 MG tablet Take 1 tablet (1 mg total) by mouth at bedtime. 30 tablet 2   docusate sodium (COLACE) 100 MG capsule Take 200 mg by mouth at bedtime.     Estradiol 10 MCG TABS vaginal tablet Place one tablet vaginally qhs x 1 week, then change to 2 x a week. 24 tablet 0   fentaNYL (DURAGESIC) 25 MCG/HR Place 1 patch onto the skin every 3 (three) days.     gabapentin (NEURONTIN) 400 MG capsule TAKE 1 CAPSULE BY MOUTH 3 TIMES DAILY. 270 capsule 0   hydrochlorothiazide (HYDRODIURIL) 12.5 MG tablet TAKE 1 TABLET (12.5 MG TOTAL) BY MOUTH DAILY. (Patient taking differently: Take 12.5 mg by mouth daily as needed (headache).) 30 tablet 3   HYDROcodone-acetaminophen (NORCO) 10-325 MG tablet Take 1 tablet by mouth every 6 (six) hours as needed for severe pain (Chronic back pain). (Patient taking differently: Take 1-4 tablets by mouth 3 (three) times daily as needed for moderate pain.) 84 tablet 0   levothyroxine (SYNTHROID) 88 MCG tablet TAKE 1 TABLET BY MOUTH DAILY. 90 tablet 1   Melatonin 10 MG TABS Take 60 mg by mouth at bedtime.     NARCAN 4 MG/0.1ML LIQD nasal spray kit Place 1 spray into the nose  as needed (accidental overdose). 1 each 0   Nebivolol HCl 20 MG TABS TAKE 1 TABLET BY MOUTH DAILY. 90 tablet 0   nicotine (NICODERM CQ - DOSED IN MG/24 HOURS) 21 mg/24hr patch Place 1 patch (21 mg total) onto the skin daily. 28 patch 1   omeprazole (PRILOSEC) 40 MG capsule TAKE 1 CAPSULE BY MOUTH 2 TIMES DAILY BEFORE A MEAL. 180 capsule 3   QUEtiapine (SEROQUEL) 200 MG tablet TAKE 1 TABLET (200 MG TOTAL) BY MOUTH AT BEDTIME. 90 tablet 1   senna (SENOKOT) 8.6 MG tablet Take 1 tablet by mouth daily.     solifenacin (VESICARE) 5 MG tablet Take 5 mg by mouth 2 (two) times daily.     SPIRIVA HANDIHALER 18 MCG inhalation capsule PLACE 1 CAPSULE INTO INHALER AND INHALE DAILY. 30 capsule 12   tinidazole (TINDAMAX) 500 MG tablet Take 2 tablets (1000 mg) by mouth daily for 5 days. 10 tablet 0   tiZANidine (ZANAFLEX) 4 MG tablet Take 1 tablet (4 mg total) by mouth every 8 (eight) hours as needed. 90 tablet 2   valACYclovir (VALTREX) 500 MG tablet TAKE 1 TABLET BY MOUTH DAILY. 90 tablet 1   venlafaxine XR (EFFEXOR-XR) 150 MG 24 hr capsule TAKE 2 CAPSULES BY MOUTH DAILY. 180 capsule 1  No current facility-administered medications on file prior to visit.    Review of Systems     Objective:  There were no vitals filed for this visit. BP Readings from Last 3 Encounters:  12/13/22 105/61  10/09/22 130/84  09/11/22 (!) 150/92   Wt Readings from Last 3 Encounters:  10/09/22 150 lb (68 kg)  09/11/22 150 lb (68 kg)  09/05/22 151 lb (68.5 kg)   There is no height or weight on file to calculate BMI.    Physical Exam         Assessment & Plan:    See Problem List for Assessment and Plan of chronic medical problems.

## 2022-12-18 ENCOUNTER — Ambulatory Visit (INDEPENDENT_AMBULATORY_CARE_PROVIDER_SITE_OTHER): Payer: 59 | Admitting: Internal Medicine

## 2022-12-18 VITALS — BP 104/62 | HR 64 | Temp 98.0°F | Ht 62.5 in | Wt 144.8 lb

## 2022-12-18 DIAGNOSIS — E119 Type 2 diabetes mellitus without complications: Secondary | ICD-10-CM | POA: Diagnosis not present

## 2022-12-18 DIAGNOSIS — I10 Essential (primary) hypertension: Secondary | ICD-10-CM

## 2022-12-18 DIAGNOSIS — E038 Other specified hypothyroidism: Secondary | ICD-10-CM | POA: Diagnosis not present

## 2022-12-18 DIAGNOSIS — G4709 Other insomnia: Secondary | ICD-10-CM | POA: Diagnosis not present

## 2022-12-18 DIAGNOSIS — N1832 Chronic kidney disease, stage 3b: Secondary | ICD-10-CM | POA: Diagnosis not present

## 2022-12-18 DIAGNOSIS — R34 Anuria and oliguria: Secondary | ICD-10-CM | POA: Insufficient documentation

## 2022-12-18 LAB — POC URINALSYSI DIPSTICK (AUTOMATED)
Blood, UA: NEGATIVE
Glucose, UA: NEGATIVE
Ketones, UA: POSITIVE
Nitrite, UA: NEGATIVE
Protein, UA: POSITIVE — AB
Spec Grav, UA: >=1.03 — AB (ref 1.010–1.025)
Urobilinogen, UA: 0.2 E.U./dL
pH, UA: 5.5 (ref 5.0–8.0)

## 2022-12-18 LAB — BASIC METABOLIC PANEL
BUN: 28 mg/dL — ABNORMAL HIGH (ref 6–23)
CO2: 29 mEq/L (ref 19–32)
Calcium: 9.7 mg/dL (ref 8.4–10.5)
Chloride: 103 mEq/L (ref 96–112)
Creatinine, Ser: 1.68 mg/dL — ABNORMAL HIGH (ref 0.40–1.20)
GFR: 31.2 mL/min — ABNORMAL LOW (ref >60.00)
Glucose, Bld: 71 mg/dL (ref 70–99)
Potassium: 5.3 mEq/L — ABNORMAL HIGH (ref 3.5–5.1)
Sodium: 140 mEq/L (ref 135–145)

## 2022-12-18 LAB — HEMOGLOBIN A1C: Hgb A1c MFr Bld: 6.1 % (ref 4.6–6.5)

## 2022-12-18 LAB — TSH: TSH: 1.3 u[IU]/mL (ref 0.35–5.50)

## 2022-12-18 NOTE — Assessment & Plan Note (Signed)
Chronic Blood pressure well-controlled here today-a little on the low side She states blood pressure is typically well-controlled at home Continue amlodipine-benazepril 10-40 mg daily, bystolic 20 mg daily Continue hydrochlorothiazide 12.5 mg daily prn-she does not take this often

## 2022-12-18 NOTE — Assessment & Plan Note (Signed)
Chronic kidney disease Recent blood work had slight decline-slight AKI She does not drink much fluids and dehydration is likely contributing Urine symptoms could be related to dehydration not infection-so we will hold off on starting an antibiotic at this time CMP today, CBC Stressed trying to decrease medications Stressed smoking cessation Increase fluids-May do a healthy version of Gatorade to help better hydrate herself

## 2022-12-18 NOTE — Patient Instructions (Signed)
Blood work was ordered.   The lab is on the first floor.    Medications changes include :   none     Chronic Kidney Disease, Adult Chronic kidney disease is when lasting damage happens to the kidneys slowly over a long time. The kidneys help to: Make pee (urine). Make hormones. Keep the right amount of fluids and chemicals in the body. Most often, this disease does not go away. You must take steps to help keep the kidney damage from getting worse. If steps are not taken, the kidneys might stop working forever. What are the causes? Diabetes. High blood pressure. Diseases that affect the heart and blood vessels. Other kidney diseases. Diseases of the body's disease-fighting system. A problem with the flow of pee. Infections of the organs that make pee, store it, and take it out of the body. Swelling or irritation of your blood vessels. What increases the risk? Getting older. Having someone in your family who has kidney disease or kidney failure. Having a disease caused by genes. Taking medicines often that harm the kidneys. Being near or having contact with harmful substances. Being very overweight. Using tobacco now or in the past. What are the signs or symptoms? Feeling very tired. Having a swollen face, legs, ankles, or feet. Feeling like you may vomit or vomiting. Not feeling hungry. Being confused or not able to focus. Twitches and cramps in the leg muscles or other muscles. Dry, itchy skin. A taste of metal in your mouth. Making less pee, or making more pee. Shortness of breath. Trouble sleeping. You may also become anemic or get weak bones. Anemic means there is not enough red blood cells or hemoglobin in your blood. You may get symptoms slowly. You may not notice them until the kidney damage gets very bad. How is this treated? Often, there is no cure for this disease. Treatment can help with symptoms and help keep the disease from getting worse. You may  need to: Avoid alcohol. Avoid foods that are high in salt, potassium, phosphorous, and protein. Take medicines for symptoms and to help control other conditions. Have dialysis. This treatment gets harmful waste out of your body. Treat other problems that cause your kidney disease or make it worse. Follow these instructions at home: Medicines Take over-the-counter and prescription medicines only as told by your doctor. Do not take any new medicines, vitamins, or supplements unless your doctor says it is okay. Lifestyle  Do not smoke or use any products that contain nicotine or tobacco. If you need help quitting, ask your doctor. If you drink alcohol: Limit how much you use to: 0-1 drink a day for women who are not pregnant. 0-2 drinks a day for men. Know how much alcohol is in your drink. In the [place] one drink equals one 12 oz bottle of beer (355 mL), one 5 oz glass of wine (148 mL), or one 1 oz glass of hard liquor (44 mL). Stay at a healthy weight. If you need help losing weight, ask your doctor. General instructions  Follow instructions from your doctor about what you cannot eat or drink. Track your blood pressure at home. Tell your doctor about any changes. If you have diabetes, track your blood sugar. Exercise at least 30 minutes a day, 5 days a week. Keep your shots (vaccinations) up to date. Keep all follow-up visits. Where to find more information American Association of Kidney Patients: ResidentialShow.is [employer]: www.kidney.org American Kidney Fund: FightingMatch.com.ee  Life Options: www.lifeoptions.org Kidney School: www.kidneyschool.org Contact a doctor if: Your symptoms get worse. You get new symptoms. Get help right away if: You get symptoms of end-stage kidney disease. These include: Headaches. Losing feeling in your hands or feet. Easy bruising. Having hiccups often. Chest pain. Shortness of breath. Lack of menstrual periods, in women. You have a  fever. You make less pee than normal. You have pain or you bleed when you pee or poop. These symptoms may be an emergency. Get help right away. Call your local emergency services (911 in the [place] Do not wait to see if the symptoms will go away. Do not drive yourself to the hospital. Summary Chronic kidney disease is when lasting damage happens to the kidneys slowly over a long time. Causes of this disease include diabetes and high blood pressure. Often, there is no cure for this disease. Treatment can help symptoms and help keep the disease from getting worse. Treatment may involve lifestyle changes, medicines, and dialysis. This information is not intended to replace advice given to you by your health care provider. Make sure you discuss any questions you have with your health care provider. Document Revised: 11/24/2019 Document Reviewed: 11/24/2019 Elsevier Patient Education  2023 [employer].

## 2022-12-18 NOTE — Assessment & Plan Note (Signed)
Acute Started last week Urine amount is decreased Also states amber-colored urine No abdominal pain, fevers, chills UA?  Infection Given mild symptoms that are likely related to dehydration we will hold off on antibiotic until culture returns Stressed increase fluids

## 2022-12-18 NOTE — Assessment & Plan Note (Signed)
Chronic  Not feeling well-states fatigue, which could be multifactorial Check tsh and will titrate med dose if needed Currently taking levothyroxine 88 mcg daily

## 2022-12-18 NOTE — Assessment & Plan Note (Signed)
Chronic Has tried several medications in the past Seroquel not working well-she has to increase it and I will not increase it further She is on too many medications that are causing sedation and ideally I would like to decrease the dose of the Seroquel Can continue Seroquel 200 mg at bedtime, but cannot be taking any other sedating medications at night

## 2022-12-18 NOTE — Assessment & Plan Note (Signed)
Chronic   Lab Results  Component Value Date   HGBA1C 6.3 07/30/2022   Sugars well controlled Check A1c Continue lifestyle control

## 2022-12-19 NOTE — Addendum Note (Signed)
Addended by: Pincus Sanes on: 12/19/2022 09:36 PM   Modules accepted: Orders

## 2022-12-20 LAB — CULTURE, URINE COMPREHENSIVE: RESULT:: NO GROWTH

## 2022-12-25 DIAGNOSIS — R0602 Shortness of breath: Secondary | ICD-10-CM | POA: Diagnosis not present

## 2022-12-25 DIAGNOSIS — J449 Chronic obstructive pulmonary disease, unspecified: Secondary | ICD-10-CM | POA: Diagnosis not present

## 2022-12-26 ENCOUNTER — Telehealth: Payer: Self-pay | Admitting: Internal Medicine

## 2022-12-26 NOTE — Telephone Encounter (Signed)
Spoke with patient today. 

## 2022-12-26 NOTE — Telephone Encounter (Signed)
Pt called stating that she is itching all over her body and wanting to know if it has anything to do with her kidney function. Pt wants to discuss this matter with Dr. Lawerance Bach. Please call pt back with update.

## 2022-12-27 ENCOUNTER — Other Ambulatory Visit: Payer: Self-pay | Admitting: Internal Medicine

## 2022-12-30 ENCOUNTER — Other Ambulatory Visit (INDEPENDENT_AMBULATORY_CARE_PROVIDER_SITE_OTHER): Payer: 59

## 2022-12-30 DIAGNOSIS — N1832 Chronic kidney disease, stage 3b: Secondary | ICD-10-CM | POA: Diagnosis not present

## 2022-12-30 LAB — BASIC METABOLIC PANEL
BUN: 15 mg/dL (ref 6–23)
CO2: 28 mEq/L (ref 19–32)
Calcium: 9.3 mg/dL (ref 8.4–10.5)
Chloride: 107 mEq/L (ref 96–112)
Creatinine, Ser: 1.25 mg/dL — ABNORMAL HIGH (ref 0.40–1.20)
GFR: 44.48 mL/min — ABNORMAL LOW (ref >60.00)
Glucose, Bld: 90 mg/dL (ref 70–99)
Potassium: 4.3 mEq/L (ref 3.5–5.1)
Sodium: 142 mEq/L (ref 135–145)

## 2022-12-31 ENCOUNTER — Other Ambulatory Visit: Payer: Self-pay | Admitting: Internal Medicine

## 2023-01-07 ENCOUNTER — Other Ambulatory Visit: Payer: Self-pay | Admitting: Internal Medicine

## 2023-01-13 DIAGNOSIS — G2581 Restless legs syndrome: Secondary | ICD-10-CM | POA: Diagnosis not present

## 2023-01-13 DIAGNOSIS — Z79899 Other long term (current) drug therapy: Secondary | ICD-10-CM | POA: Diagnosis not present

## 2023-01-13 DIAGNOSIS — M545 Low back pain, unspecified: Secondary | ICD-10-CM | POA: Diagnosis not present

## 2023-01-13 DIAGNOSIS — J449 Chronic obstructive pulmonary disease, unspecified: Secondary | ICD-10-CM | POA: Diagnosis not present

## 2023-01-13 DIAGNOSIS — J439 Emphysema, unspecified: Secondary | ICD-10-CM | POA: Diagnosis not present

## 2023-01-16 DIAGNOSIS — Z79899 Other long term (current) drug therapy: Secondary | ICD-10-CM | POA: Diagnosis not present

## 2023-01-23 ENCOUNTER — Telehealth: Payer: Self-pay

## 2023-01-23 NOTE — Telephone Encounter (Signed)
I called patient and rescheduled surgery. 

## 2023-01-23 NOTE — Telephone Encounter (Signed)
Patient ready to reschedule TKA.  Do you want or need to see her again?

## 2023-01-24 ENCOUNTER — Other Ambulatory Visit: Payer: Self-pay | Admitting: Internal Medicine

## 2023-01-24 ENCOUNTER — Other Ambulatory Visit: Payer: Self-pay

## 2023-01-24 DIAGNOSIS — J449 Chronic obstructive pulmonary disease, unspecified: Secondary | ICD-10-CM | POA: Diagnosis not present

## 2023-01-24 DIAGNOSIS — R0602 Shortness of breath: Secondary | ICD-10-CM | POA: Diagnosis not present

## 2023-01-27 ENCOUNTER — Encounter: Payer: Self-pay | Admitting: Internal Medicine

## 2023-01-27 NOTE — Patient Instructions (Addendum)
      Blood work was ordered.   The lab is on the first floor.    Medications changes include :       A referral was ordered and someone will call you to schedule an appointment.     Return in about 6 months (around 07/31/2023) for Physical Exam.

## 2023-01-27 NOTE — Progress Notes (Signed)
Subjective:    Patient ID: Jill Phillips, female    DOB: 07-Aug-1956, 67 y.o.   MRN: [id number]     HPI Braxtyn is here for follow up of her chronic medical problems.  Still using o2 at night -document  ?  Trial of Trelegy or Breztri ?  Taking gabapentin 3 times daily or just at bedtime-same as tizanidine  Medications and allergies reviewed with patient and updated if appropriate.  Current Outpatient Medications on File Prior to Visit  Medication Sig Dispense Refill  . acetaminophen (TYLENOL) 500 MG tablet Take 1,000 mg by mouth daily as needed for moderate pain or headache.    . albuterol (VENTOLIN HFA) 108 (90 Base) MCG/ACT inhaler INHALE 2 PUFFS INTO THE LUNGS EVERY 6 HOURS AS NEEDED FOR WHEEZING OR SHORTNESS OF BREATH. 8.5 g 0  . docusate sodium (COLACE) 100 MG capsule Take 200 mg by mouth at bedtime.    . fentaNYL (DURAGESIC) 25 MCG/HR Place 1 patch onto the skin every 3 (three) days.    . hydrochlorothiazide (HYDRODIURIL) 12.5 MG tablet TAKE 1 TABLET (12.5 MG TOTAL) BY MOUTH DAILY. (Patient taking differently: Take 12.5 mg by mouth daily as needed (headache).) 30 tablet 3  . NARCAN 4 MG/0.1ML LIQD nasal spray kit Place 1 spray into the nose as needed (accidental overdose). 1 each 0  . Nebivolol HCl 20 MG TABS TAKE 1 TABLET BY MOUTH DAILY. 90 tablet 1  . omeprazole (PRILOSEC) 40 MG capsule TAKE 1 CAPSULE BY MOUTH 2 TIMES DAILY BEFORE A MEAL. (Patient taking differently: Take 40 mg by mouth 2 (two) times daily before a meal.) 180 capsule 3  . oxyCODONE (OXY IR/ROXICODONE) 5 MG immediate release tablet Take 1-2 tablets (5-10 mg total) by mouth every 4 (four) hours as needed for moderate pain (pain score 4-6). 30 tablet 0  . Psyllium (VEGETABLE LAXATIVE PO) Take 1 tablet by mouth daily.    . QUEtiapine (SEROQUEL) 200 MG tablet TAKE 1 TABLET (200 MG TOTAL) BY MOUTH AT BEDTIME. 90 tablet 1  . rOPINIRole (REQUIP) 1 MG tablet Take 1-2 tablets (1-2 mg total) by mouth 2 (two) times  daily as needed (restless leg). 60 tablet 0  . solifenacin (VESICARE) 5 MG tablet Take 10 mg by mouth daily.     No current facility-administered medications on file prior to visit.     Review of Systems     Objective:  There were no vitals filed for this visit. BP Readings from Last 3 Encounters:  02/14/23 107/66  02/13/23 106/62  02/04/23 99/67   Wt Readings from Last 3 Encounters:  02/11/23 139 lb (63 kg)  02/04/23 139 lb 6.4 oz (63.2 kg)  12/18/22 144 lb 12.8 oz (65.7 kg)   There is no height or weight on file to calculate BMI.    Physical Exam     Lab Results  Component Value Date   WBC 10.4 02/14/2023   HGB 10.9 (L) 02/14/2023   HCT 32.0 (L) 02/14/2023   PLT 166 02/14/2023   GLUCOSE 99 02/14/2023   CHOL 223 (H) 07/30/2022   TRIG 167.0 (H) 07/30/2022   HDL 50.00 07/30/2022   LDLDIRECT 110.0 07/16/2021   LDLCALC 139 (H) 07/30/2022   ALT 14 02/14/2023   AST 20 02/14/2023   NA 137 02/14/2023   K 3.7 02/14/2023   CL 104 02/14/2023   CREATININE 1.05 (H) 02/14/2023   BUN 19 02/14/2023   CO2 25 02/14/2023   TSH 1.30  12/18/2022   INR 1.1 02/14/2023   HGBA1C 5.8 (H) 02/12/2023   MICROALBUR 0.8 01/11/2022     Assessment & Plan:    See Problem List for Assessment and Plan of chronic medical problems.    This encounter was created in error - please disregard.

## 2023-01-28 ENCOUNTER — Encounter: Payer: Medicare Other | Admitting: Internal Medicine

## 2023-01-28 DIAGNOSIS — I10 Essential (primary) hypertension: Secondary | ICD-10-CM

## 2023-01-28 DIAGNOSIS — J9611 Chronic respiratory failure with hypoxia: Secondary | ICD-10-CM

## 2023-01-28 DIAGNOSIS — E038 Other specified hypothyroidism: Secondary | ICD-10-CM

## 2023-01-28 DIAGNOSIS — F3289 Other specified depressive episodes: Secondary | ICD-10-CM

## 2023-01-28 DIAGNOSIS — K219 Gastro-esophageal reflux disease without esophagitis: Secondary | ICD-10-CM

## 2023-01-28 DIAGNOSIS — J439 Emphysema, unspecified: Secondary | ICD-10-CM

## 2023-01-28 DIAGNOSIS — F419 Anxiety disorder, unspecified: Secondary | ICD-10-CM

## 2023-01-28 DIAGNOSIS — N1832 Chronic kidney disease, stage 3b: Secondary | ICD-10-CM

## 2023-01-28 DIAGNOSIS — G2581 Restless legs syndrome: Secondary | ICD-10-CM

## 2023-01-28 DIAGNOSIS — G4709 Other insomnia: Secondary | ICD-10-CM

## 2023-01-28 DIAGNOSIS — E7849 Other hyperlipidemia: Secondary | ICD-10-CM

## 2023-01-28 DIAGNOSIS — E119 Type 2 diabetes mellitus without complications: Secondary | ICD-10-CM

## 2023-01-28 NOTE — Assessment & Plan Note (Signed)
Chronic Taking gabapentin 400 mg nightly, tizanidine 4 mg nightly 

## 2023-01-28 NOTE — Assessment & Plan Note (Signed)
Chronic   Lab Results  Component Value Date   HGBA1C 6.1 12/18/2022   Sugars well controlled with lifestyle Check A1c Continue lifestyle control

## 2023-01-28 NOTE — Assessment & Plan Note (Signed)
Chronic Controlled, Stable Continue Effexor XR 300 mg daily, clonazepam 1 mg once bedtime

## 2023-01-28 NOTE — Assessment & Plan Note (Signed)
Chronic °GERD controlled °Continue omeprazole 40 mg twice daily °

## 2023-01-28 NOTE — Assessment & Plan Note (Signed)
Chronic Has tried several medications in the past Seroquel not working well-she has to increase it and I will not increase it further Can continue Seroquel 200 mg at bedtime

## 2023-01-28 NOTE — Assessment & Plan Note (Signed)
Chronic  Not feeling well-states fatigue, which could be multifactorial Check tsh and will titrate med dose if needed Currently taking levothyroxine 88 mcg daily 

## 2023-01-28 NOTE — Assessment & Plan Note (Signed)
Chronic kidney disease CMP today, CBC Stressed trying to decrease medications Stressed increase fluids Stressed smoking cessation  stressed no NSAIDs Discussed importance of good blood pressure and sugar control

## 2023-01-28 NOTE — Assessment & Plan Note (Signed)
Chronic Blood pressure well-controlled  She states blood pressure is typically well-controlled at home Continue amlodipine-benazepril 10-40 mg daily, bystolic 20 mg daily Continue hydrochlorothiazide 12.5 mg daily prn-she does not take this often CMP, CBC

## 2023-01-28 NOTE — Assessment & Plan Note (Signed)
Chronic Controlled, Stable Continue Effexor XR 300 mg daily 

## 2023-01-28 NOTE — Assessment & Plan Note (Signed)
Chronic Still smoking-stressed smoking cessation Has chronic shortness of breath, intermittent cough and wheeze Continue albuterol as needed Currently on Spiriva daily-discussed changing to Trelegy, Markus Daft or [employer]

## 2023-01-28 NOTE — Assessment & Plan Note (Signed)
Chronic °Regular exercise and healthy diet encouraged °Check lipid panel  °Continue atorvastatin 20 mg daily °

## 2023-02-03 NOTE — Pre-Procedure Instructions (Signed)
Surgical Instructions    Your procedure is scheduled on February 11, 2023.  Report to [facility] Main Entrance "A" at 12:25 P.M., then check in with the Admitting office.  Call this number if you have problems the morning of surgery:  [phone]  If you have any questions prior to your surgery date call [phone]: Open Monday-Friday 8am-4pm If you experience any cold or flu symptoms such as cough, fever, chills, shortness of breath, etc. between now and your scheduled surgery, please notify us at the above number.     Remember:  Do not eat after midnight the night before your surgery  You may drink clear liquids until 11:25 AM the morning of your surgery.   Clear liquids allowed are: Water, Non-Citrus Juices (without pulp), Carbonated Beverages, Clear Tea, Black Coffee Only (NO MILK, CREAM OR POWDERED CREAMER of any kind), and Gatorade.  Patient Instructions  The night before surgery:  No food after midnight. ONLY clear liquids after midnight  The day of surgery (if you do NOT have diabetes):  Drink ONE (1) Pre-Surgery Clear Ensure by 11:25 AM the morning of surgery. Drink in one sitting. Do not sip.  This drink was given to you during your hospital  pre-op appointment visit.  Nothing else to drink after completing the  Pre-Surgery Clear Ensure.         If you have questions, please contact your surgeon's office.     Take these medicines the morning of surgery with A SIP OF WATER:  atorvastatin (LIPITOR)   gabapentin (NEURONTIN)   levothyroxine (SYNTHROID)   Nebivolol   omeprazole (PRILOSEC)   solifenacin (VESICARE)   SPIRIVA HANDIHALER   valACYclovir (VALTREX)   venlafaxine XR [facility])    May take these medicines IF NEEDED:  acetaminophen (TYLENOL)   albuterol (VENTOLIN HFA) inhaler   HYDROcodone-acetaminophen (NORCO)   NARCAN   tiZANidine (ZANAFLEX)    As of today, STOP taking any Aspirin (unless otherwise instructed by your surgeon) Aleve, Naproxen,  Ibuprofen, Motrin, Advil, Goody's, BC's, all herbal medications, fish oil, and all vitamins.                     Do NOT Smoke (Tobacco/Vaping) for 24 hours prior to your procedure.  If you use a CPAP at night, you may bring your mask/headgear for your overnight stay.   Contacts, glasses, piercing's, hearing aid's, dentures or partials may not be worn into surgery, please bring cases for these belongings.    For patients admitted to the hospital, discharge time will be determined by your treatment team.   Patients discharged the day of surgery will not be allowed to drive home, and someone needs to stay with them for 24 hours.  SURGICAL WAITING ROOM VISITATION Patients having surgery or a procedure may have no more than 2 support people in the waiting area - these visitors may rotate.   Children under the age of 85 must have an adult with them who is not the patient. If the patient needs to stay at the hospital during part of their recovery, the visitor guidelines for inpatient rooms apply. Pre-op nurse will coordinate an appropriate time for 1 support person to accompany patient in pre-op.  This support person may not rotate.   Please refer to the [facility] website for the visitor guidelines for Inpatients (after your surgery is over and you are in a regular room).   If you received a COVID test during your pre-op visit  it is  requested that you wear a mask when out in public, stay away from anyone that may not be feeling well and notify your surgeon if you develop symptoms. If you have been in contact with anyone that has tested positive in the last 10 days please notify you surgeon.    Pre-operative 5 CHG Bath Instructions   You can play a key role in reducing the risk of infection after surgery. Your skin needs to be as free of germs as possible. You can reduce the number of germs on your skin by washing with CHG (chlorhexidine gluconate) soap before surgery. CHG is an antiseptic soap  that kills germs and continues to kill germs even after washing.   DO NOT use if you have an allergy to chlorhexidine/CHG or antibacterial soaps. If your skin becomes reddened or irritated, stop using the CHG and notify one of our RNs at [phone].   Please shower with the CHG soap starting 4 days before surgery using the following schedule:     Please keep in mind the following:  DO NOT shave, including legs and underarms, starting the day of your first shower.   You may shave your face at any point before/day of surgery.  Place clean sheets on your bed the day you start using CHG soap. Use a clean washcloth (not used since being washed) for each shower. DO NOT sleep with pets once you start using the CHG.   CHG Shower Instructions:  If you choose to wash your hair and private area, wash first with your normal shampoo/soap.  After you use shampoo/soap, rinse your hair and body thoroughly to remove shampoo/soap residue.  Turn the water OFF and apply about 3 tablespoons (45 ml) of CHG soap to a CLEAN washcloth.  Apply CHG soap ONLY FROM YOUR NECK DOWN TO YOUR TOES (washing for 3-5 minutes)  DO NOT use CHG soap on face, private areas, open wounds, or sores.  Pay special attention to the area where your surgery is being performed.  If you are having back surgery, having someone wash your back for you may be helpful. Wait 2 minutes after CHG soap is applied, then you may rinse off the CHG soap.  Pat dry with a clean towel  Put on clean clothes/pajamas   If you choose to wear lotion, please use ONLY the CHG-compatible lotions on the back of this paper.     Additional instructions for the day of surgery: DO NOT APPLY any lotions, deodorants, cologne, or perfumes.   Do not wear jewelry or makeup Do not wear nail polish, gel polish, artificial nails, or any other type of covering on natural nails (fingers and toes) Do not bring valuables to the hospital. [facility] is not responsible for  any belongings or valuables. Put on clean/comfortable clothes.  Brush your teeth.  Ask your nurse before applying any prescription medications to the skin.      CHG Compatible Lotions   Aveeno Moisturizing lotion  Cetaphil Moisturizing Cream  Cetaphil Moisturizing Lotion  Clairol Herbal Essence Moisturizing Lotion, Dry Skin  Clairol Herbal Essence Moisturizing Lotion, Extra Dry Skin  Clairol Herbal Essence Moisturizing Lotion, Normal Skin  Curel Age Defying Therapeutic Moisturizing Lotion with Alpha Hydroxy  Curel Extreme Care Body Lotion  Curel Soothing Hands Moisturizing Hand Lotion  Curel Therapeutic Moisturizing Cream, Fragrance-Free  Curel Therapeutic Moisturizing Lotion, Fragrance-Free  Curel Therapeutic Moisturizing Lotion, Original Formula  Eucerin Daily Replenishing Lotion  Eucerin Dry Skin Therapy Plus Alpha Hydroxy Crme  Eucerin Dry Skin Therapy Plus Alpha Hydroxy Lotion  Eucerin Original Crme  Eucerin Original Lotion  Eucerin Plus Crme Eucerin Plus Lotion  Eucerin TriLipid Replenishing Lotion  Keri Anti-Bacterial Hand Lotion  Keri Deep Conditioning Original Lotion Dry Skin Formula Softly Scented  Keri Deep Conditioning Original Lotion, Fragrance Free Sensitive Skin Formula  Keri Lotion Fast Absorbing Fragrance Free Sensitive Skin Formula  Keri Lotion Fast Absorbing Softly Scented Dry Skin Formula  Keri Original Lotion  Keri Skin Renewal Lotion Keri Silky Smooth Lotion  Keri Silky Smooth Sensitive Skin Lotion  Nivea Body Creamy Conditioning Oil  Nivea Body Extra Enriched Lotion  Nivea Body Original Lotion  Nivea Body Sheer Moisturizing Lotion Nivea Crme  Nivea Skin Firming Lotion  NutraDerm 30 Skin Lotion  NutraDerm Skin Lotion  NutraDerm Therapeutic Skin Cream  NutraDerm Therapeutic Skin Lotion  ProShield Protective Hand Cream  Provon moisturizing lotion    Please read over the following fact sheets that you were given.

## 2023-02-04 ENCOUNTER — Encounter (HOSPITAL_COMMUNITY)
Admission: RE | Admit: 2023-02-04 | Discharge: 2023-02-04 | Disposition: A | Discharge status: Home / Self Care | Payer: 59 | Source: Ambulatory Visit | Attending: Orthopaedic Surgery | Admitting: Orthopaedic Surgery

## 2023-02-04 ENCOUNTER — Other Ambulatory Visit: Payer: Self-pay

## 2023-02-04 ENCOUNTER — Telehealth: Payer: Self-pay

## 2023-02-04 ENCOUNTER — Encounter (HOSPITAL_COMMUNITY): Payer: Self-pay

## 2023-02-04 VITALS — BP 99/67 | HR 58 | Temp 97.9°F | Resp 18 | Ht 62.0 in | Wt 139.4 lb

## 2023-02-04 DIAGNOSIS — M1712 Unilateral primary osteoarthritis, left knee: Secondary | ICD-10-CM | POA: Diagnosis not present

## 2023-02-04 DIAGNOSIS — Z01818 Encounter for other preprocedural examination: Secondary | ICD-10-CM | POA: Insufficient documentation

## 2023-02-04 DIAGNOSIS — M47814 Spondylosis without myelopathy or radiculopathy, thoracic region: Secondary | ICD-10-CM | POA: Diagnosis not present

## 2023-02-04 DIAGNOSIS — M4804 Spinal stenosis, thoracic region: Secondary | ICD-10-CM | POA: Diagnosis not present

## 2023-02-04 HISTORY — DX: Dyspnea, unspecified: R06.00

## 2023-02-04 LAB — COMPREHENSIVE METABOLIC PANEL
ALT: 27 U/L (ref 0–44)
AST: 26 U/L (ref 15–41)
Albumin: 3.5 g/dL (ref 3.5–5.0)
Alkaline Phosphatase: 113 U/L (ref 38–126)
Anion gap: 10 (ref 5–15)
BUN: 16 mg/dL (ref 8–23)
CO2: 27 mmol/L (ref 22–32)
Calcium: 8.9 mg/dL (ref 8.9–10.3)
Chloride: 104 mmol/L (ref 98–111)
Creatinine, Ser: 1.21 mg/dL — ABNORMAL HIGH (ref 0.44–1.00)
GFR, Estimated: 49 mL/min — ABNORMAL LOW (ref >60)
Glucose, Bld: 89 mg/dL (ref 70–99)
Potassium: 3.6 mmol/L (ref 3.5–5.1)
Sodium: 141 mmol/L (ref 135–145)
Total Bilirubin: 0.6 mg/dL (ref 0.3–1.2)
Total Protein: 6.5 g/dL (ref 6.5–8.1)

## 2023-02-04 LAB — CBC
HCT: 45.6 % (ref 36.0–46.0)
Hemoglobin: 15 g/dL (ref 12.0–15.0)
MCH: 32.4 pg (ref 26.0–34.0)
MCHC: 32.9 g/dL (ref 30.0–36.0)
MCV: 98.5 fL (ref 80.0–100.0)
Platelets: 229 10*3/uL (ref 150–400)
RBC: 4.63 MIL/uL (ref 3.87–5.11)
RDW: 13.2 % (ref 11.5–15.5)
WBC: 9.4 10*3/uL (ref 4.0–10.5)
nRBC: 0 % (ref 0.0–0.2)

## 2023-02-04 LAB — SURGICAL PCR SCREEN
MRSA, PCR: NEGATIVE
Staphylococcus aureus: NEGATIVE

## 2023-02-04 LAB — GLUCOSE, CAPILLARY: Glucose-Capillary: 91 mg/dL (ref 70–99)

## 2023-02-04 NOTE — Progress Notes (Signed)
PCP - Dr. Cheryll Cockayne Cardiologist - Dr. Carolan Clines  PPM/ICD - Denies Device Orders - n/a Rep Notified - n/a  Chest x-ray - 09/05/2022 EKG - 09/05/2022 Stress Test - 08/28/2022 ECHO - 06/24/2021 Cardiac Cath - Denies  Sleep Study - +OSA many years ago and completed surgery. No CPAP. Dr. Craige Cotta has told her that she no longer has OSA  Pt is Pre-DM  Last dose of GLP1 agonist- n/a GLP1 instructions: n/a  Blood Thinner Instructions: n/a Aspirin Instructions: n/a  ERAS Protcol - Clear liquids until 1125 morning of surgery PRE-SURGERY Ensure or G2- Ensure given to pt with instructions  COVID TEST- n/a   Anesthesia review: Yes. Cardiac and Medical Clearance. Pt also endorsed a warm, red, tender area in her right axillary that she noticed today. She does not endorse any systemic symptoms related to abscess. Antionette Poles, PA-C evaluated area and recommended follow-up with PCP to evaluate and possibly place on antibiotics prior to surgery. Pt tried to call PCP during visit and was only able to reach receptionist who would pass the message along to PCP. RN instructed pt to reach out to [place] if she is unable to get any answers.   Patient denies shortness of breath, fever, cough and chest pain at PAT appointment. Pt denies any respiratory illness/infection in the last two months.   All instructions explained to the patient, with a verbal understanding of the material. Patient agrees to go over the instructions while at home for a better understanding. Patient also instructed to self quarantine after being tested for COVID-19. The opportunity to ask questions was provided.

## 2023-02-04 NOTE — Telephone Encounter (Signed)
I called patient and left voice mail for return call to discuss. 

## 2023-02-04 NOTE — Telephone Encounter (Signed)
Patient had pre-admission testing appointment today.  She reported a small abscess under her arm.  She is being told to call her PCP for antibiotics.  If she takes antibiotics, will she be okay for TKA surgery on 02/11/23?

## 2023-02-05 ENCOUNTER — Other Ambulatory Visit: Payer: Self-pay | Admitting: Internal Medicine

## 2023-02-05 NOTE — Anesthesia Preprocedure Evaluation (Addendum)
Anesthesia Evaluation  Patient identified by MRN, date of birth, ID band Patient awake    Reviewed: Allergy & Precautions, NPO status , Patient's Chart, lab work & pertinent test results  History of Anesthesia Complications (+) PONV and history of anesthetic complications  Airway Mallampati: II  TM Distance: >3 FB Neck ROM: Full    Dental  (+) Edentulous Lower, Edentulous Upper   Pulmonary sleep apnea , COPD,  COPD inhaler, Current Smoker and Patient abstained from smoking.   Pulmonary exam normal        Cardiovascular hypertension, Pt. on medications and Pt. on home beta blockers Normal cardiovascular exam  TTE 06/24/2021:  1. Left ventricular ejection fraction, by estimation, is 60 to 65%. The  left ventricle has normal function. The left ventricle has no regional  wall motion abnormalities. Left ventricular diastolic parameters are  indeterminate.   2. Right ventricular systolic function is normal. The right ventricular  size is not well visualized.   3. The mitral valve is grossly normal. Trivial mitral valve  regurgitation. No evidence of mitral stenosis.   4. The aortic valve was not well visualized. Aortic valve regurgitation  is not visualized. No aortic stenosis is present.  Stress test 08/28/2022:   Findings are consistent with no prior ischemia. The study is low risk.   No ST deviation was noted.   LV perfusion is abnormal. Defect 1: There is a small defect with mild reduction in uptake present in the apical to mid anterior and apex location(s) that is partially reversible. There is normal wall motion in the defect area. Consistent with artifact caused by diaphragmatic attenuation.   Left ventricular function is normal. End diastolic cavity size is normal. End systolic cavity size is normal     Neuro/Psych   Anxiety Depression    L3-S1 lumbar fusion, on chronic opioids    GI/Hepatic ,GERD  Medicated,,   Endo/Other  diabetes, Type 2Hypothyroidism    Renal/GU Renal InsufficiencyRenal disease     Musculoskeletal  (+) Arthritis ,    Abdominal   Peds  Hematology   Anesthesia Other Findings   Reproductive/Obstetrics                             Anesthesia Physical Anesthesia Plan  ASA: 2  Anesthesia Plan: Spinal   Post-op Pain Management: Regional block* and Tylenol PO (pre-op)*   Induction:   PONV Risk Score and Plan: 2 and Ondansetron, Dexamethasone, Propofol infusion, Treatment may vary due to age or medical condition and Midazolam  Airway Management Planned: Natural Airway and Simple Face Mask  Additional Equipment: None  Intra-op Plan:   Post-operative Plan:   Informed Consent: I have reviewed the patients History and Physical, chart, labs and discussed the procedure including the risks, benefits and alternatives for the proposed anesthesia with the patient or authorized representative who has indicated his/her understanding and acceptance.       Plan Discussed with: CRNA  Anesthesia Plan Comments: (PAT note by Antionette Poles, PA-C: Patient was seen by cardiologist Dr. Carolan Clines 08/12/2022 for preop evaluation prior to undergoing TKA.  She has no history of coronary disease but does have CVD risk factors including HTN, prediabetes, chronic smoker.  Prior echo 06/2021 showed normal EF, no significant valvular disease.  Dr. Wyline Mood recommended stress test for preop restratification.  Test done 08/28/2022 low risk she was cleared to proceed with TKA.  Current smoker with associated COPD, maintained on Spiriva  and as needed albuterol.  He also has a prior history of OSA, states she had a surgical procedure to correct this, no longer using CPAP.  History of CKD 3.  Labs reviewed, creatinine mildly elevated 1.21, otherwise unremarkable.  I examined patient at preop testing visit due to her report of painful bump in her right axilla.  She states  she noticed it this morning when showering, tender to the touch.  Denies any injury to the area.  Denies any other signs or symptoms of systemic illness.  On examination she has a ~2 to 3 cm indurated area with erythema consistent with small superficial abscess.  There is no drainage.  She was instructed to reach out to her PCP for management.  I also called Dr. Eliberto Ivory office to make him aware.  He advised that she will need to be treated with resolution prior to proceeding with surgery.  His office is in contact with the patient to help ensure this.  EKG 09/05/2022: Sinus rhythm.  Rate 55.  Incomplete left bundle branch block.  Low voltage, precordial leads.  Stress test 08/28/2022:   Findings are consistent with no prior ischemia. The study is low risk.   No ST deviation was noted.   LV perfusion is abnormal. Defect 1: There is a small defect with mild reduction in uptake present in the apical to mid anterior and apex location(s) that is partially reversible. There is normal wall motion in the defect area. Consistent with artifact caused by diaphragmatic attenuation.   Left ventricular function is normal. End diastolic cavity size is normal. End systolic cavity size is normal.   Prior study not available for comparison.  Small size, mild intensity partially reversible (SDS 2) apical and apical septal perfusion defect which is seen at rest and improves with stress supine imaging, but reoccurs with stress upright imaging, typical of breast attenuation artifact. LVEF >75% with normal wall motion. This is a low risk study. No prior for comparison.  TTE 06/24/2021: 1. Left ventricular ejection fraction, by estimation, is 60 to 65%. The  left ventricle has normal function. The left ventricle has no regional  wall motion abnormalities. Left ventricular diastolic parameters are  indeterminate.  2. Right ventricular systolic function is normal. The right ventricular  size is not well  visualized.  3. The mitral valve is grossly normal. Trivial mitral valve  regurgitation. No evidence of mitral stenosis.  4. The aortic valve was not well visualized. Aortic valve regurgitation  is not visualized. No aortic stenosis is present.  5. Pulmonic valve regurgitation not assessed.  6. Aortic Normal visualized descending aorta.  7. The inferior vena cava is normal in size with greater than 50%  respiratory variability, suggesting right atrial pressure of 3 mmHg.   Comparison(s): No prior Echocardiogram.    )        Anesthesia Quick Evaluation

## 2023-02-05 NOTE — Progress Notes (Signed)
Anesthesia Chart Review:  Patient was seen by cardiologist Dr. Carolan Clines 08/12/2022 for preop evaluation prior to undergoing TKA.  She has no history of coronary disease but does have CVD risk factors including HTN, prediabetes, chronic smoker.  Prior echo 06/2021 showed normal EF, no significant valvular disease.  Dr. Wyline Mood recommended stress test for preop restratification.  Test done 08/28/2022 low risk she was cleared to proceed with TKA.  Current smoker with associated COPD, maintained on Spiriva and as needed albuterol.  He also has a prior history of OSA, states she had a surgical procedure to correct this, no longer using CPAP.  History of CKD 3.  Labs reviewed, creatinine mildly elevated 1.21, otherwise unremarkable.  I examined patient at preop testing visit due to her report of painful bump in her right axilla.  She states she noticed it this morning when showering, tender to the touch.  Denies any injury to the area.  Denies any other signs or symptoms of systemic illness.  On examination she has a ~2 to 3 cm indurated area with erythema consistent with small superficial abscess.  There is no drainage.  She was instructed to reach out to her PCP for management.  I also called Dr. Eliberto Ivory office to make him aware.  He advised that she will need to be treated with resolution prior to proceeding with surgery.  His office is in contact with the patient to help ensure this.  EKG 09/05/2022: Sinus rhythm.  Rate 55.  Incomplete left bundle branch block.  Low voltage, precordial leads.  Stress test 08/28/2022:   Findings are consistent with no prior ischemia. The study is low risk.   No ST deviation was noted.   LV perfusion is abnormal. Defect 1: There is a small defect with mild reduction in uptake present in the apical to mid anterior and apex location(s) that is partially reversible. There is normal wall motion in the defect area. Consistent with artifact caused by diaphragmatic  attenuation.   Left ventricular function is normal. End diastolic cavity size is normal. End systolic cavity size is normal.   Prior study not available for comparison.   Small size, mild intensity partially reversible (SDS 2) apical and apical septal perfusion defect which is seen at rest and improves with stress supine imaging, but reoccurs with stress upright imaging, typical of breast attenuation artifact. LVEF >75% with normal wall motion. This is a low risk study. No prior for comparison.  TTE 06/24/2021:  1. Left ventricular ejection fraction, by estimation, is 60 to 65%. The  left ventricle has normal function. The left ventricle has no regional  wall motion abnormalities. Left ventricular diastolic parameters are  indeterminate.   2. Right ventricular systolic function is normal. The right ventricular  size is not well visualized.   3. The mitral valve is grossly normal. Trivial mitral valve  regurgitation. No evidence of mitral stenosis.   4. The aortic valve was not well visualized. Aortic valve regurgitation  is not visualized. No aortic stenosis is present.   5. Pulmonic valve regurgitation not assessed.   6. Aortic Normal visualized descending aorta.   7. The inferior vena cava is normal in size with greater than 50%  respiratory variability, suggesting right atrial pressure of 3 mmHg.   Comparison(s): No prior Echocardiogram.      Zannie Cove [facility] Short Stay Center/Anesthesiology Phone [phone] 02/05/2023 2:32 PM

## 2023-02-05 NOTE — Telephone Encounter (Signed)
I called patient and she stated that PCP would not call in antibiotic.  Will you prescribe one?  She uses [facility].

## 2023-02-05 NOTE — Telephone Encounter (Signed)
I called patient and left voice mail that she needs to be seen by PCP or UC to get evaluated and Rx based on that.  Dr. Magnus Ivan would still need to see her on Monday to evaluate if surgery can be done.

## 2023-02-06 ENCOUNTER — Ambulatory Visit: Payer: 59 | Admitting: Physician Assistant

## 2023-02-07 NOTE — Telephone Encounter (Signed)
Spoke with patient and as of today, patient is going to be seen at a [facility] today for abscess under arm.  She will see Dr. Magnus Ivan Monday morning for him to assess and decide about proceeding with surgery.

## 2023-02-09 ENCOUNTER — Telehealth: Payer: Self-pay | Admitting: *Deleted

## 2023-02-09 NOTE — Telephone Encounter (Signed)
Ortho bundle call completed. 

## 2023-02-09 NOTE — Care Plan (Signed)
[facility] RNCM call to patient to discuss her upcoming Left total knee with Dr. Magnus Ivan on 02/11/23. She is an Ortho bundle patient through [facility] and agreeable to case management. She does have an "abscess" she says under her arm that she informed she would go have looked at by [facility] over the weekend, but she did not b/c she said she got there and they didn't honor the appointment she made on line. She was going to go to Urgent Care on Saturday, but her daughter never showed up to pick her up. She states she has a sister that may or may not be able to come to help her for just 3 days after surgery, but she states her daughters are not reliable to come and help her. She does have an appt with Dr. Magnus Ivan tomorrow to look at the abscess to see if surgery needs to be canceled. She does have a walker. Reviewed home health referral to [facility]. Reviewed some post op care instructions. Will see what Dr. Magnus Ivan determines after seeing her tomorrow in office.

## 2023-02-10 ENCOUNTER — Encounter: Payer: Self-pay | Admitting: Orthopaedic Surgery

## 2023-02-10 ENCOUNTER — Ambulatory Visit (INDEPENDENT_AMBULATORY_CARE_PROVIDER_SITE_OTHER): Payer: 59 | Admitting: Orthopaedic Surgery

## 2023-02-10 DIAGNOSIS — G8929 Other chronic pain: Secondary | ICD-10-CM

## 2023-02-10 DIAGNOSIS — M25562 Pain in left knee: Secondary | ICD-10-CM

## 2023-02-10 DIAGNOSIS — M1712 Unilateral primary osteoarthritis, left knee: Secondary | ICD-10-CM

## 2023-02-10 MED ORDER — CLINDAMYCIN HCL 300 MG PO CAPS
300.0000 mg | ORAL_CAPSULE | Freq: Three times a day (TID) | ORAL | 0 refills | Status: DC
Start: 2023-02-10 — End: 2023-02-11

## 2023-02-10 NOTE — H&P (Signed)
TOTAL KNEE ADMISSION H&P  Patient is being admitted for left total knee arthroplasty.  Subjective:  Chief Complaint:left knee pain.  HPI: Jill Phillips, 67 y.o. female, has a history of pain and functional disability in the left knee due to arthritis and has failed non-surgical conservative treatments for greater than 12 weeks to includeNSAID's and/or analgesics, corticosteriod injections, viscosupplementation injections, flexibility and strengthening excercises, and activity modification.  Onset of symptoms was gradual, starting 2 years ago with gradually worsening course since that time. The patient noted no past surgery on the left knee(s).  Patient currently rates pain in the left knee(s) at 10 out of 10 with activity. Patient has night pain, worsening of pain with activity and weight bearing, pain that interferes with activities of daily living, pain with passive range of motion, crepitus, and joint swelling.  Patient has evidence of subchondral sclerosis, periarticular osteophytes, and joint space narrowing by imaging studies. There is no active infection.  Patient Active Problem List   Diagnosis Date Noted   Urination decrease 12/18/2022   Chronic pelvic pain in female 09/11/2022   Pain of left thumb 09/11/2022   Chest pain 07/30/2022   Unilateral primary osteoarthritis, left knee 01/09/2022   Onychomycosis 10/04/2021   Itching 07/16/2021   AKI (acute kidney injury) (HCC) 02/04/2021   Acute gout 06/20/2020   Interstitial cystitis 01/21/2020   Status post shoulder surgery 09/09/2019   Nausea 08/19/2019   Anxiety 05/04/2019   Insomnia 05/04/2019   Syncope 12/08/2018   Disorder of rotator cuff syndrome of left shoulder and allied disorder 06/22/2018   Bilateral leg edema 06/04/2018   Type 2 diabetes mellitus without complication, without long-term current use of insulin (HCC) 12/02/2017   Sacral pain 09/17/2017   COPD (chronic obstructive pulmonary disease) (HCC) 04/21/2017    Lower back pain 03/03/2017   CKD (chronic kidney disease) stage 3, GFR 30-59 ml/min (HCC) 08/07/2016   GERD (gastroesophageal reflux disease) 08/07/2016   Chronic respiratory failure (HCC) 07/29/2016   Arthritis of carpometacarpal (CMC) joint of left thumb 07/09/2016   Pneumothorax on left 01/31/2016   Chronic, continuous use of opioids 09/06/2015   Degenerative disc disease, lumbar 08/01/2015   Spondylolisthesis of lumbar region 08/01/2015   Hypoxia 09/14/2014   Constipation 05/05/2014   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/14/2013   HTN (hypertension) 05/19/2013   HLD (hyperlipidemia) 05/19/2013   Depression 05/19/2013   Hypothyroidism 05/19/2013   Tobacco abuse    Osteoarthritis of both knees 11/18/2011   RLS (restless legs syndrome) 11/18/2011   Past Medical History:  Diagnosis Date   Active smoker    Anxiety    Calcifying tendinitis of shoulder    Chronic back pain    Chronic kidney disease 2022   related to potential overdose   Chronic pain syndrome    COPD (chronic obstructive pulmonary disease) (HCC)    Dyspnea    with exertion   Dysthymic disorder    Emphysema lung (HCC)    GERD (gastroesophageal reflux disease)    Heart murmur    for years, nothing to be concerned about   Herpes genitalia    History of blood transfusion 09/02/1978   related to "back surgery"   Hyperlipidemia    Hypertension    Hypothyroidism    Lumbago    Osteoarthrosis, unspecified whether generalized or localized, lower leg    Pain in joint, upper arm    Pneumothorax, left 01/31/2016   S/P Left posterior subcostal pain injection on 01/30/2016   PONV (postoperative nausea and  vomiting)    gets nauseous  with longer surgery. Difficuty voiding after surgery   Postlaminectomy syndrome, thoracic region    Pre-diabetes    Primary localized osteoarthrosis, lower leg    Restless leg syndrome    Sleep apnea    s/p surgery- last sleep study 2011- doesnt use oxygen or machine at night as instructed,.   12/2014- Dr  Craige Cotta  reports it is negative.   Thyroid disease     Past Surgical History:  Procedure Laterality Date   APPENDECTOMY     BACK SURGERY     18 back surgeries (2 thoracic & 16 lumbar) (01/31/2016)   DILATION AND CURETTAGE OF UTERUS     HAMMER TOE SURGERY     IR RADIOLOGIST EVAL & MGMT  07/21/2017   JOINT REPLACEMENT     KNEE ARTHROSCOPY Right    LAPAROSCOPIC CHOLECYSTECTOMY     LUMBAR FUSION N/A 08/01/2015   Procedure: Right sided L1-2 and L2-3 transforaminal lumbar interbody fusion with cages, Extension of posterior fusion T12 to L3, Replaced pedicle screws bilaterally L1-L2 , Replaced left sided pedicle screws L-3. Instrumentation T12 to L3 using local bone graft, Vivigen allograft and cancellous chips;  Surgeon: Kerrin Champagne, MD;  Location: [facility] OR;  Service: Orthopedics;  Laterality: N/A;   LUMBAR LAMINECTOMY/DECOMPRESSION MICRODISCECTOMY N/A 01/28/2014   Procedure: Minimally Invasive Right  L1-2 Microdiscectomy;  Surgeon: Kerrin Champagne, MD;  Location: [facility] OR;  Service: Orthopedics;  Laterality: N/A;   REVERSE SHOULDER ARTHROPLASTY Left 09/09/2019   Procedure: LEFT REVERSE SHOULDER REPLACEMENT;  Surgeon: Cammy Copa, MD;  Location: [facility] OR;  Service: Orthopedics;  Laterality: Left;   TOTAL HIP ARTHROPLASTY Right    TOTAL KNEE ARTHROPLASTY  05/29/2012   Procedure: TOTAL KNEE ARTHROPLASTY;  Surgeon: Kathryne Hitch, MD;  Location: [facility] ORS;  Service: Orthopedics;  Laterality: Right;  Right Total Knee Arthroplasty   TUBAL LIGATION     UVULOPALATOPHARYNGOPLASTY     VAGINAL HYSTERECTOMY      No current facility-administered medications for this encounter.   Current Outpatient Medications  Medication Sig Dispense Refill Last Dose   acetaminophen (TYLENOL) 500 MG tablet Take 1,000 mg by mouth daily as needed for moderate pain or headache.      albuterol (VENTOLIN HFA) 108 (90 Base) MCG/ACT inhaler INHALE 2 PUFFS INTO THE LUNGS EVERY 6 HOURS AS NEEDED FOR WHEEZING OR SHORTNESS OF BREATH.  8.5 g 0    amLODipine-benazepril (LOTREL) 10-40 MG capsule TAKE 1 CAPSULE BY MOUTH DAILY. 90 capsule 0    atorvastatin (LIPITOR) 20 MG tablet TAKE 1 TABLET BY MOUTH DAILY. 90 tablet 1    clonazePAM (KLONOPIN) 1 MG tablet Take 1 tablet (1 mg total) by mouth at bedtime. 30 tablet 2    docusate sodium (COLACE) 100 MG capsule Take 200 mg by mouth at bedtime.      fentaNYL (DURAGESIC) 25 MCG/HR Place 1 patch onto the skin every 3 (three) days.      gabapentin (NEURONTIN) 400 MG capsule TAKE 1 CAPSULE BY MOUTH 3 TIMES DAILY. 270 capsule 0    hydrochlorothiazide (HYDRODIURIL) 12.5 MG tablet TAKE 1 TABLET (12.5 MG TOTAL) BY MOUTH DAILY. (Patient taking differently: Take 12.5 mg by mouth daily as needed (headache).) 30 tablet 3    HYDROcodone-acetaminophen (NORCO) 10-325 MG tablet Take 1 tablet by mouth every 6 (six) hours as needed for severe pain (Chronic back pain). (Patient taking differently: Take 1 tablet by mouth 3 (three) times daily as needed for moderate  pain.) 84 tablet 0    levothyroxine (SYNTHROID) 88 MCG tablet TAKE 1 TABLET BY MOUTH DAILY. 90 tablet 1    NARCAN 4 MG/0.1ML LIQD nasal spray kit Place 1 spray into the nose as needed (accidental overdose). 1 each 0    Nebivolol HCl 20 MG TABS TAKE 1 TABLET BY MOUTH DAILY. 90 tablet 1    omeprazole (PRILOSEC) 40 MG capsule TAKE 1 CAPSULE BY MOUTH 2 TIMES DAILY BEFORE A MEAL. 180 capsule 3    Psyllium (VEGETABLE LAXATIVE PO) Take 1 tablet by mouth daily.      QUEtiapine (SEROQUEL) 200 MG tablet TAKE 1 TABLET (200 MG TOTAL) BY MOUTH AT BEDTIME. 90 tablet 1    rOPINIRole (REQUIP) 1 MG tablet Take 1-2 mg by mouth 2 (two) times daily as needed (restless leg).      solifenacin (VESICARE) 5 MG tablet Take 10 mg by mouth daily.      SPIRIVA HANDIHALER 18 MCG inhalation capsule PLACE 1 CAPSULE INTO INHALER AND INHALE DAILY. 30 capsule 12    tiZANidine (ZANAFLEX) 4 MG tablet Take 1 tablet (4 mg total) by mouth every 8 (eight) hours as needed. 90 tablet 2     venlafaxine XR (EFFEXOR-XR) 150 MG 24 hr capsule TAKE 2 CAPSULES BY MOUTH DAILY. 180 capsule 1    clindamycin (CLEOCIN) 300 MG capsule Take 1 capsule (300 mg total) by mouth 3 (three) times daily. 3 capsule 0    Estradiol 10 MCG TABS vaginal tablet Place one tablet vaginally qhs x 1 week, then change to 2 x a week. (Patient not taking: Reported on 01/31/2023) 24 tablet 0 Not Taking   nicotine (NICODERM CQ - DOSED IN MG/24 HOURS) 21 mg/24hr patch Place 1 patch (21 mg total) onto the skin daily. (Patient not taking: Reported on 01/31/2023) 28 patch 1 Not Taking   tinidazole (TINDAMAX) 500 MG tablet Take 2 tablets (1000 mg) by mouth daily for 5 days. (Patient not taking: Reported on 01/31/2023) 10 tablet 0 Not Taking   valACYclovir (VALTREX) 500 MG tablet TAKE 1 TABLET BY MOUTH DAILY. 90 tablet 1    Allergies  Allergen Reactions   Flagyl [Metronidazole] Diarrhea   Limonene Itching    Yeast infection in mouth   Sulfa Antibiotics Other (See Comments)    Yeast infection in mouth   Doxycycline     Mouth soreness - reaction vs thrush?   Ibuprofen     Kidney Disease   Amoxicillin Other (See Comments)    REACTION: Oral yeast infection Has patient had a PCN reaction causing immediate rash, facial/tongue/throat swelling, SOB or lightheadedness with hypotension: No Has patient had a PCN reaction causing severe rash involving mucus membranes or skin necrosis: No Has patient had a PCN reaction that required hospitalization No Has patient had a PCN reaction occurring within the last 10 years: No If all of the above answers are "NO", then may proceed with Cephalosporin use.    Chlorzoxazone Other (See Comments)    headache   Codeine Other (See Comments)    headache   Darvocet [Propoxyphene N-Acetaminophen] Itching   Dilaudid [Hydromorphone Hcl] Itching   Hydralazine Other (See Comments)    Makes her feel bad   Keflex [Cephalexin] Other (See Comments)    Pt does not recall reaction (maybe yeast  infection)   Morphine And Codeine Itching   Nitrofurantoin Monohyd Macro Hives    Reaction to [employer] [Oxycodone-Acetaminophen] Itching    Patient can tolerate Acetaminophen solely  Trazodone And Nefazodone Palpitations    Social History   Tobacco Use   Smoking status: Every Day    Packs/day: 1.00    Years: 45.00    Additional pack years: 0.00    Total pack years: 45.00    Types: Cigarettes   Smokeless tobacco: Never   Tobacco comments:    Uses Nicoderm patch discussed 1-800-quit-now  Substance Use Topics   Alcohol use: No    Alcohol/week: 0.0 standard drinks of alcohol    Family History  Problem Relation Age of Onset   Kidney disease Mother    Heart disease Father    Heart disease Sister 30       s/p CABG   Hypertension Sister    Anuerysm Brother 29       brain   Heart disease Brother    Colon cancer Neg Hx    Breast cancer Neg Hx    Ovarian cancer Neg Hx      Review of Systems  Objective:  Physical Exam Vitals reviewed.  Constitutional:      Appearance: Normal appearance. She is normal weight.  HENT:     Head: Normocephalic and atraumatic.  Eyes:     Extraocular Movements: Extraocular movements intact.     Pupils: Pupils are equal, round, and reactive to light.  Cardiovascular:     Rate and Rhythm: Normal rate.     Pulses: Normal pulses.  Pulmonary:     Effort: Pulmonary effort is normal.  Abdominal:     Palpations: Abdomen is soft.  Musculoskeletal:     Cervical back: Normal range of motion and neck supple.     Left knee: Bony tenderness and crepitus present. Decreased range of motion. Tenderness present over the medial joint line, lateral joint line and patellar tendon.  Neurological:     Mental Status: She is alert and oriented to person, place, and time.  Psychiatric:        Behavior: Behavior normal.     Vital signs in last 24 hours:    Labs:   Estimated body mass index is 25.5 kg/m as calculated from the following:    Height as of 02/04/23: 5\' 2"  (1.575 m).   Weight as of 02/04/23: 63.2 kg.   Imaging Review Plain radiographs demonstrate severe degenerative joint disease of the left knee(s). The overall alignment ismild varus. The bone quality appears to be good for age and reported activity level.      Assessment/Plan:  End stage arthritis, left knee   The patient history, physical examination, clinical judgment of the provider and imaging studies are consistent with end stage degenerative joint disease of the left knee(s) and total knee arthroplasty is deemed medically necessary. The treatment options including medical management, injection therapy arthroscopy and arthroplasty were discussed at length. The risks and benefits of total knee arthroplasty were presented and reviewed. The risks due to aseptic loosening, infection, stiffness, patella tracking problems, thromboembolic complications and other imponderables were discussed. The patient acknowledged the explanation, agreed to proceed with the plan and consent was signed. Patient is being admitted for inpatient treatment for surgery, pain control, PT, OT, prophylactic antibiotics, VTE prophylaxis, progressive ambulation and ADL's and discharge planning. The patient is planning to be discharged home with home health services

## 2023-02-10 NOTE — Progress Notes (Signed)
Patient was called to inform that the surgery time for tomorrow was changed to 12:00 o'clock. The patient and her daughter were not available and this Clinical research associate left a message. Patient was instructed to be at the hospital at 10:00 o'clock and stop drinking liquids at 09:00 o'clock.

## 2023-02-10 NOTE — Progress Notes (Signed)
The patient is scheduled tomorrow for a left knee replacement.  She comes in today for [place] to look at a small area in her axilla that was concerning for infection.  She does have a small little lesion in her axilla area but this is more of a sebaceous cyst.  I was able to aspirate some fluid from this area that did not seem to be consistent with any type of infection.  There is no redness or induration and it is not painful to her.  I gave her reassurance that we can proceed with knee replacement surgery tomorrow but it was good for her to come in the office today for [place] to check this out.

## 2023-02-11 ENCOUNTER — Inpatient Hospital Stay (HOSPITAL_COMMUNITY)
Admission: RE | Admit: 2023-02-11 | Discharge: 2023-02-13 | DRG: 470 | Disposition: A | Discharge status: Home Health | Payer: 59 | Attending: Orthopaedic Surgery | Admitting: Orthopaedic Surgery

## 2023-02-11 ENCOUNTER — Encounter (HOSPITAL_COMMUNITY): Payer: Self-pay | Admitting: Orthopaedic Surgery

## 2023-02-11 ENCOUNTER — Other Ambulatory Visit: Payer: Self-pay

## 2023-02-11 ENCOUNTER — Ambulatory Visit (HOSPITAL_BASED_OUTPATIENT_CLINIC_OR_DEPARTMENT_OTHER): Payer: 59 | Admitting: Anesthesiology

## 2023-02-11 ENCOUNTER — Encounter (HOSPITAL_COMMUNITY)
Admission: RE | Disposition: A | Discharge status: Home Health | Payer: Self-pay | Source: Home / Self Care | Attending: Orthopaedic Surgery

## 2023-02-11 ENCOUNTER — Observation Stay (HOSPITAL_COMMUNITY): Payer: 59

## 2023-02-11 ENCOUNTER — Ambulatory Visit (HOSPITAL_COMMUNITY): Payer: 59 | Admitting: Physician Assistant

## 2023-02-11 DIAGNOSIS — Z885 Allergy status to narcotic agent status: Secondary | ICD-10-CM

## 2023-02-11 DIAGNOSIS — E039 Hypothyroidism, unspecified: Secondary | ICD-10-CM | POA: Diagnosis present

## 2023-02-11 DIAGNOSIS — Z79890 Hormone replacement therapy (postmenopausal): Secondary | ICD-10-CM

## 2023-02-11 DIAGNOSIS — Z981 Arthrodesis status: Secondary | ICD-10-CM | POA: Diagnosis not present

## 2023-02-11 DIAGNOSIS — Z96612 Presence of left artificial shoulder joint: Secondary | ICD-10-CM | POA: Diagnosis present

## 2023-02-11 DIAGNOSIS — Z8249 Family history of ischemic heart disease and other diseases of the circulatory system: Secondary | ICD-10-CM

## 2023-02-11 DIAGNOSIS — F17210 Nicotine dependence, cigarettes, uncomplicated: Secondary | ICD-10-CM

## 2023-02-11 DIAGNOSIS — Z9049 Acquired absence of other specified parts of digestive tract: Secondary | ICD-10-CM

## 2023-02-11 DIAGNOSIS — Z79899 Other long term (current) drug therapy: Secondary | ICD-10-CM | POA: Diagnosis not present

## 2023-02-11 DIAGNOSIS — N183 Chronic kidney disease, stage 3 unspecified: Secondary | ICD-10-CM | POA: Diagnosis not present

## 2023-02-11 DIAGNOSIS — K219 Gastro-esophageal reflux disease without esophagitis: Secondary | ICD-10-CM | POA: Diagnosis present

## 2023-02-11 DIAGNOSIS — Z96652 Presence of left artificial knee joint: Secondary | ICD-10-CM

## 2023-02-11 DIAGNOSIS — E1122 Type 2 diabetes mellitus with diabetic chronic kidney disease: Secondary | ICD-10-CM | POA: Diagnosis present

## 2023-02-11 DIAGNOSIS — I129 Hypertensive chronic kidney disease with stage 1 through stage 4 chronic kidney disease, or unspecified chronic kidney disease: Secondary | ICD-10-CM | POA: Diagnosis present

## 2023-02-11 DIAGNOSIS — M170 Bilateral primary osteoarthritis of knee: Principal | ICD-10-CM | POA: Diagnosis present

## 2023-02-11 DIAGNOSIS — Z881 Allergy status to other antibiotic agents status: Secondary | ICD-10-CM | POA: Diagnosis not present

## 2023-02-11 DIAGNOSIS — G894 Chronic pain syndrome: Secondary | ICD-10-CM | POA: Diagnosis present

## 2023-02-11 DIAGNOSIS — I10 Essential (primary) hypertension: Secondary | ICD-10-CM | POA: Diagnosis not present

## 2023-02-11 DIAGNOSIS — J439 Emphysema, unspecified: Secondary | ICD-10-CM | POA: Diagnosis present

## 2023-02-11 DIAGNOSIS — G8918 Other acute postprocedural pain: Secondary | ICD-10-CM | POA: Diagnosis not present

## 2023-02-11 DIAGNOSIS — Z841 Family history of disorders of kidney and ureter: Secondary | ICD-10-CM

## 2023-02-11 DIAGNOSIS — M1712 Unilateral primary osteoarthritis, left knee: Secondary | ICD-10-CM

## 2023-02-11 DIAGNOSIS — E785 Hyperlipidemia, unspecified: Secondary | ICD-10-CM | POA: Diagnosis present

## 2023-02-11 DIAGNOSIS — J449 Chronic obstructive pulmonary disease, unspecified: Secondary | ICD-10-CM | POA: Diagnosis not present

## 2023-02-11 DIAGNOSIS — Z90710 Acquired absence of both cervix and uterus: Secondary | ICD-10-CM

## 2023-02-11 DIAGNOSIS — Z888 Allergy status to other drugs, medicaments and biological substances status: Secondary | ICD-10-CM

## 2023-02-11 DIAGNOSIS — Z96651 Presence of right artificial knee joint: Secondary | ICD-10-CM | POA: Diagnosis not present

## 2023-02-11 DIAGNOSIS — Z96641 Presence of right artificial hip joint: Secondary | ICD-10-CM | POA: Diagnosis not present

## 2023-02-11 DIAGNOSIS — Z471 Aftercare following joint replacement surgery: Secondary | ICD-10-CM | POA: Diagnosis not present

## 2023-02-11 DIAGNOSIS — G2581 Restless legs syndrome: Secondary | ICD-10-CM | POA: Diagnosis present

## 2023-02-11 DIAGNOSIS — J9610 Chronic respiratory failure, unspecified whether with hypoxia or hypercapnia: Secondary | ICD-10-CM | POA: Diagnosis not present

## 2023-02-11 DIAGNOSIS — Z880 Allergy status to penicillin: Secondary | ICD-10-CM

## 2023-02-11 DIAGNOSIS — G8929 Other chronic pain: Secondary | ICD-10-CM

## 2023-02-11 HISTORY — PX: TOTAL KNEE ARTHROPLASTY: SHX125

## 2023-02-11 SURGERY — ARTHROPLASTY, KNEE, TOTAL
Anesthesia: Spinal | Site: Knee | Laterality: Left

## 2023-02-11 MED ORDER — FENTANYL CITRATE (PF) 250 MCG/5ML IJ SOLN
INTRAMUSCULAR | Status: AC
  Filled 2023-02-11: qty 5

## 2023-02-11 MED ORDER — CEFAZOLIN SODIUM-DEXTROSE 2-4 GM/100ML-% IV SOLN
INTRAVENOUS | Status: AC
  Filled 2023-02-11: qty 100

## 2023-02-11 MED ORDER — QUETIAPINE FUMARATE 100 MG PO TABS
200.0000 mg | ORAL_TABLET | Freq: Every day | ORAL | Status: DC
  Administered 2023-02-11 – 2023-02-12 (×2): 200 mg via ORAL
  Filled 2023-02-11 (×2): qty 2

## 2023-02-11 MED ORDER — VALACYCLOVIR HCL 500 MG PO TABS
500.0000 mg | ORAL_TABLET | Freq: Every day | ORAL | Status: DC
  Administered 2023-02-12 – 2023-02-13 (×2): 500 mg via ORAL
  Filled 2023-02-11 (×2): qty 1

## 2023-02-11 MED ORDER — DIPHENHYDRAMINE HCL 12.5 MG/5ML PO ELIX: 12.5000 mg | ORAL_SOLUTION | ORAL | Status: DC | PRN

## 2023-02-11 MED ORDER — ONDANSETRON HCL 4 MG/2ML IJ SOLN: 4.0000 mg | Freq: Four times a day (QID) | INTRAMUSCULAR | Status: DC | PRN

## 2023-02-11 MED ORDER — CHLORHEXIDINE GLUCONATE 0.12 % MT SOLN
15.0000 mL | Freq: Once | OROMUCOSAL | Status: AC
  Administered 2023-02-11: 15 mL via OROMUCOSAL
  Filled 2023-02-11: qty 15

## 2023-02-11 MED ORDER — ONDANSETRON HCL 4 MG/2ML IJ SOLN
INTRAMUSCULAR | Status: DC | PRN
  Administered 2023-02-11: 4 mg via INTRAVENOUS

## 2023-02-11 MED ORDER — LACTATED RINGERS IV SOLN: INTRAVENOUS | Status: DC

## 2023-02-11 MED ORDER — OXYCODONE HCL 5 MG PO TABS
10.0000 mg | ORAL_TABLET | ORAL | Status: DC | PRN
  Administered 2023-02-12: 10 mg via ORAL
  Administered 2023-02-12: 15 mg via ORAL
  Administered 2023-02-12: 10 mg via ORAL
  Administered 2023-02-12 (×2): 15 mg via ORAL
  Administered 2023-02-13: 10 mg via ORAL
  Filled 2023-02-11: qty 3
  Filled 2023-02-11: qty 2
  Filled 2023-02-11 (×2): qty 3
  Filled 2023-02-11: qty 2

## 2023-02-11 MED ORDER — MIDAZOLAM HCL 2 MG/2ML IJ SOLN: 2.0000 mg | Freq: Once | INTRAMUSCULAR | Status: AC

## 2023-02-11 MED ORDER — BUPIVACAINE-EPINEPHRINE (PF) 0.5% -1:200000 IJ SOLN
INTRAMUSCULAR | Status: DC | PRN
  Administered 2023-02-11: 15 mL via PERINEURAL

## 2023-02-11 MED ORDER — VENLAFAXINE HCL ER 150 MG PO CP24
150.0000 mg | ORAL_CAPSULE | Freq: Every day | ORAL | Status: DC
  Administered 2023-02-12 – 2023-02-13 (×2): 150 mg via ORAL
  Filled 2023-02-11 (×2): qty 1

## 2023-02-11 MED ORDER — UMECLIDINIUM BROMIDE 62.5 MCG/ACT IN AEPB
1.0000 | INHALATION_SPRAY | Freq: Every day | RESPIRATORY_TRACT | Status: DC
  Administered 2023-02-12: 1 via RESPIRATORY_TRACT
  Filled 2023-02-11: qty 7

## 2023-02-11 MED ORDER — DOCUSATE SODIUM 100 MG PO CAPS
100.0000 mg | ORAL_CAPSULE | Freq: Two times a day (BID) | ORAL | Status: DC
  Administered 2023-02-11 – 2023-02-13 (×4): 100 mg via ORAL
  Filled 2023-02-11 (×4): qty 1

## 2023-02-11 MED ORDER — ASPIRIN 81 MG PO CHEW
81.0000 mg | CHEWABLE_TABLET | Freq: Two times a day (BID) | ORAL | Status: DC
  Administered 2023-02-11 – 2023-02-13 (×4): 81 mg via ORAL
  Filled 2023-02-11 (×4): qty 1

## 2023-02-11 MED ORDER — CEFAZOLIN SODIUM-DEXTROSE 2-4 GM/100ML-% IV SOLN
2.0000 g | INTRAVENOUS | Status: AC
  Administered 2023-02-11: 2 g via INTRAVENOUS

## 2023-02-11 MED ORDER — METOCLOPRAMIDE HCL 5 MG PO TABS: 5.0000 mg | ORAL_TABLET | Freq: Three times a day (TID) | ORAL | Status: DC | PRN

## 2023-02-11 MED ORDER — MENTHOL 3 MG MT LOZG: 1.0000 | LOZENGE | OROMUCOSAL | Status: DC | PRN

## 2023-02-11 MED ORDER — BUPIVACAINE-EPINEPHRINE (PF) 0.25% -1:200000 IJ SOLN
INTRAMUSCULAR | Status: DC | PRN
  Administered 2023-02-11: 30 mL

## 2023-02-11 MED ORDER — MIDAZOLAM HCL 2 MG/2ML IJ SOLN
INTRAMUSCULAR | Status: AC
  Administered 2023-02-11: 2 mg via INTRAVENOUS
  Filled 2023-02-11: qty 2

## 2023-02-11 MED ORDER — EPHEDRINE SULFATE-NACL 50-0.9 MG/10ML-% IV SOSY
PREFILLED_SYRINGE | INTRAVENOUS | Status: DC | PRN
  Administered 2023-02-11: 15 mg via INTRAVENOUS
  Administered 2023-02-11: 10 mg via INTRAVENOUS

## 2023-02-11 MED ORDER — ONDANSETRON HCL 4 MG PO TABS: 4.0000 mg | ORAL_TABLET | Freq: Four times a day (QID) | ORAL | Status: DC | PRN

## 2023-02-11 MED ORDER — CLONIDINE HCL (ANALGESIA) 100 MCG/ML EP SOLN
EPIDURAL | Status: DC | PRN
  Administered 2023-02-11: 100 ug

## 2023-02-11 MED ORDER — PHENYLEPHRINE 80 MCG/ML (10ML) SYRINGE FOR IV PUSH (FOR BLOOD PRESSURE SUPPORT)
PREFILLED_SYRINGE | INTRAVENOUS | Status: DC | PRN
  Administered 2023-02-11 (×2): 80 ug via INTRAVENOUS

## 2023-02-11 MED ORDER — SODIUM CHLORIDE 0.9 % IV SOLN: INTRAVENOUS | Status: DC

## 2023-02-11 MED ORDER — HYDROCODONE-ACETAMINOPHEN 7.5-325 MG PO TABS
1.0000 | ORAL_TABLET | Freq: Once | ORAL | Status: AC | PRN
  Administered 2023-02-11: 1 via ORAL

## 2023-02-11 MED ORDER — PANTOPRAZOLE SODIUM 40 MG PO TBEC
40.0000 mg | DELAYED_RELEASE_TABLET | Freq: Every day | ORAL | Status: DC
  Administered 2023-02-11 – 2023-02-13 (×3): 40 mg via ORAL
  Filled 2023-02-11 (×3): qty 1

## 2023-02-11 MED ORDER — TRANEXAMIC ACID-NACL 1000-0.7 MG/100ML-% IV SOLN
INTRAVENOUS | Status: AC
  Filled 2023-02-11: qty 100

## 2023-02-11 MED ORDER — FENTANYL CITRATE (PF) 100 MCG/2ML IJ SOLN
INTRAMUSCULAR | Status: AC
  Administered 2023-02-11: 100 ug via INTRAVENOUS
  Filled 2023-02-11: qty 2

## 2023-02-11 MED ORDER — ACETAMINOPHEN 500 MG PO TABS: 1000.0000 mg | ORAL_TABLET | Freq: Once | ORAL | Status: DC

## 2023-02-11 MED ORDER — BUPIVACAINE-EPINEPHRINE (PF) 0.25% -1:200000 IJ SOLN
INTRAMUSCULAR | Status: AC
  Filled 2023-02-11: qty 30

## 2023-02-11 MED ORDER — BENAZEPRIL HCL 20 MG PO TABS
40.0000 mg | ORAL_TABLET | Freq: Every day | ORAL | Status: DC
  Administered 2023-02-12 – 2023-02-13 (×2): 40 mg via ORAL
  Filled 2023-02-11 (×2): qty 2

## 2023-02-11 MED ORDER — BUPIVACAINE IN DEXTROSE 0.75-8.25 % IT SOLN
INTRATHECAL | Status: DC | PRN
  Administered 2023-02-11: 1.6 mL via INTRATHECAL

## 2023-02-11 MED ORDER — TIZANIDINE HCL 4 MG PO TABS
4.0000 mg | ORAL_TABLET | Freq: Four times a day (QID) | ORAL | Status: DC | PRN
  Administered 2023-02-11 – 2023-02-12 (×3): 4 mg via ORAL
  Filled 2023-02-11 (×3): qty 1

## 2023-02-11 MED ORDER — CLONAZEPAM 0.5 MG PO TABS
1.0000 mg | ORAL_TABLET | Freq: Every day | ORAL | Status: DC
  Administered 2023-02-11 – 2023-02-12 (×2): 1 mg via ORAL
  Filled 2023-02-11 (×2): qty 2

## 2023-02-11 MED ORDER — IPRATROPIUM-ALBUTEROL 0.5-2.5 (3) MG/3ML IN SOLN
3.0000 mL | RESPIRATORY_TRACT | Status: DC
  Administered 2023-02-11: 3 mL via RESPIRATORY_TRACT

## 2023-02-11 MED ORDER — FENTANYL CITRATE (PF) 100 MCG/2ML IJ SOLN
25.0000 ug | INTRAMUSCULAR | Status: DC | PRN
  Administered 2023-02-11 (×2): 25 ug via INTRAVENOUS

## 2023-02-11 MED ORDER — NEBIVOLOL HCL 10 MG PO TABS
20.0000 mg | ORAL_TABLET | Freq: Every day | ORAL | Status: DC
  Administered 2023-02-12 – 2023-02-13 (×2): 20 mg via ORAL
  Filled 2023-02-11 (×2): qty 2

## 2023-02-11 MED ORDER — FENTANYL CITRATE (PF) 100 MCG/2ML IJ SOLN
INTRAMUSCULAR | Status: AC
  Filled 2023-02-11: qty 2

## 2023-02-11 MED ORDER — POLYETHYLENE GLYCOL 3350 17 G PO PACK: 17.0000 g | PACK | Freq: Every day | ORAL | Status: DC | PRN

## 2023-02-11 MED ORDER — AMISULPRIDE (ANTIEMETIC) 5 MG/2ML IV SOLN: 10.0000 mg | Freq: Once | INTRAVENOUS | Status: DC | PRN

## 2023-02-11 MED ORDER — CEFAZOLIN SODIUM-DEXTROSE 1-4 GM/50ML-% IV SOLN
1.0000 g | Freq: Four times a day (QID) | INTRAVENOUS | Status: AC
  Administered 2023-02-11 – 2023-02-12 (×2): 1 g via INTRAVENOUS
  Filled 2023-02-11 (×2): qty 50

## 2023-02-11 MED ORDER — ORAL CARE MOUTH RINSE: 15.0000 mL | Freq: Once | OROMUCOSAL | Status: AC

## 2023-02-11 MED ORDER — SODIUM CHLORIDE 0.9 % IR SOLN
Status: DC | PRN
  Administered 2023-02-11: 1000 mL

## 2023-02-11 MED ORDER — ACETAMINOPHEN 325 MG PO TABS
325.0000 mg | ORAL_TABLET | Freq: Four times a day (QID) | ORAL | Status: DC | PRN
  Administered 2023-02-12: 325 mg via ORAL
  Filled 2023-02-11: qty 2

## 2023-02-11 MED ORDER — PROPOFOL 10 MG/ML IV BOLUS
INTRAVENOUS | Status: DC | PRN
  Administered 2023-02-11: 200 ug via INTRAVENOUS
  Administered 2023-02-11: 50 ug via INTRAVENOUS

## 2023-02-11 MED ORDER — PHENOL 1.4 % MT LIQD: 1.0000 | OROMUCOSAL | Status: DC | PRN

## 2023-02-11 MED ORDER — ACETAMINOPHEN 500 MG PO TABS
ORAL_TABLET | ORAL | Status: AC
  Filled 2023-02-11: qty 2

## 2023-02-11 MED ORDER — ALUM & MAG HYDROXIDE-SIMETH 200-200-20 MG/5ML PO SUSP: 30.0000 mL | ORAL | Status: DC | PRN

## 2023-02-11 MED ORDER — TRANEXAMIC ACID-NACL 1000-0.7 MG/100ML-% IV SOLN
1000.0000 mg | INTRAVENOUS | Status: AC
  Administered 2023-02-11: 1000 mg via INTRAVENOUS

## 2023-02-11 MED ORDER — DEXAMETHASONE SODIUM PHOSPHATE 10 MG/ML IJ SOLN
INTRAMUSCULAR | Status: DC | PRN
  Administered 2023-02-11: 4 mg via INTRAVENOUS

## 2023-02-11 MED ORDER — FESOTERODINE FUMARATE ER 4 MG PO TB24
4.0000 mg | ORAL_TABLET | Freq: Every day | ORAL | Status: DC
  Administered 2023-02-11 – 2023-02-13 (×3): 4 mg via ORAL
  Filled 2023-02-11 (×3): qty 1

## 2023-02-11 MED ORDER — METOCLOPRAMIDE HCL 5 MG/ML IJ SOLN: 5.0000 mg | Freq: Three times a day (TID) | INTRAMUSCULAR | Status: DC | PRN

## 2023-02-11 MED ORDER — PROPOFOL 500 MG/50ML IV EMUL
INTRAVENOUS | Status: DC | PRN
  Administered 2023-02-11: 125 ug/kg/min via INTRAVENOUS

## 2023-02-11 MED ORDER — GABAPENTIN 400 MG PO CAPS
400.0000 mg | ORAL_CAPSULE | Freq: Three times a day (TID) | ORAL | Status: DC
  Administered 2023-02-11 – 2023-02-13 (×5): 400 mg via ORAL
  Filled 2023-02-11 (×5): qty 1

## 2023-02-11 MED ORDER — LEVOTHYROXINE SODIUM 88 MCG PO TABS
88.0000 ug | ORAL_TABLET | Freq: Every day | ORAL | Status: DC
  Administered 2023-02-12 – 2023-02-13 (×2): 88 ug via ORAL
  Filled 2023-02-11 (×2): qty 1

## 2023-02-11 MED ORDER — FENTANYL 25 MCG/HR TD PT72
1.0000 | MEDICATED_PATCH | TRANSDERMAL | Status: DC
  Administered 2023-02-11: 1 via TRANSDERMAL
  Filled 2023-02-11: qty 1

## 2023-02-11 MED ORDER — AMLODIPINE BESYLATE 10 MG PO TABS
10.0000 mg | ORAL_TABLET | Freq: Every day | ORAL | Status: DC
  Administered 2023-02-12 – 2023-02-13 (×2): 10 mg via ORAL
  Filled 2023-02-11 (×2): qty 1

## 2023-02-11 MED ORDER — 0.9 % SODIUM CHLORIDE (POUR BTL) OPTIME
TOPICAL | Status: DC | PRN
  Administered 2023-02-11: 1000 mL

## 2023-02-11 MED ORDER — ALBUTEROL SULFATE (2.5 MG/3ML) 0.083% IN NEBU: 2.5000 mg | INHALATION_SOLUTION | Freq: Four times a day (QID) | RESPIRATORY_TRACT | Status: DC | PRN

## 2023-02-11 MED ORDER — ATORVASTATIN CALCIUM 10 MG PO TABS
20.0000 mg | ORAL_TABLET | Freq: Every day | ORAL | Status: DC
  Administered 2023-02-12 – 2023-02-13 (×2): 20 mg via ORAL
  Filled 2023-02-11 (×2): qty 2

## 2023-02-11 MED ORDER — HYDROCODONE-ACETAMINOPHEN 7.5-325 MG PO TABS
ORAL_TABLET | ORAL | Status: AC
  Filled 2023-02-11: qty 1

## 2023-02-11 MED ORDER — FENTANYL CITRATE (PF) 250 MCG/5ML IJ SOLN
INTRAMUSCULAR | Status: DC | PRN
  Administered 2023-02-11 (×2): 50 ug via INTRAVENOUS
  Administered 2023-02-11: 100 ug via INTRAVENOUS
  Administered 2023-02-11: 50 ug via INTRAVENOUS

## 2023-02-11 MED ORDER — HYDROMORPHONE HCL 1 MG/ML IJ SOLN
0.5000 mg | INTRAMUSCULAR | Status: DC | PRN
  Administered 2023-02-12 (×2): 1 mg via INTRAVENOUS
  Filled 2023-02-11 (×3): qty 1

## 2023-02-11 MED ORDER — FENTANYL CITRATE (PF) 100 MCG/2ML IJ SOLN: 100.0000 ug | Freq: Once | INTRAMUSCULAR | Status: AC

## 2023-02-11 MED ORDER — OXYCODONE HCL 5 MG PO TABS
5.0000 mg | ORAL_TABLET | ORAL | Status: DC | PRN
  Administered 2023-02-12: 5 mg via ORAL
  Administered 2023-02-13: 10 mg via ORAL
  Filled 2023-02-11: qty 1
  Filled 2023-02-11 (×2): qty 2

## 2023-02-11 MED ORDER — POVIDONE-IODINE 10 % EX SWAB
2.0000 | Freq: Once | CUTANEOUS | Status: AC
  Administered 2023-02-11: 2 via TOPICAL

## 2023-02-11 MED ORDER — IPRATROPIUM-ALBUTEROL 0.5-2.5 (3) MG/3ML IN SOLN
RESPIRATORY_TRACT | Status: AC
  Filled 2023-02-11: qty 3

## 2023-02-11 MED ORDER — PHENYLEPHRINE HCL-NACL 20-0.9 MG/250ML-% IV SOLN
INTRAVENOUS | Status: DC | PRN
  Administered 2023-02-11: 35 ug/min via INTRAVENOUS

## 2023-02-11 SURGICAL SUPPLY — 75 items
BAG COUNTER SPONGE SURGICOUNT (BAG) ×1 IMPLANT
BAG SPNG CNTER NS LX DISP (BAG) ×1
BANDAGE ESMARK 6X9 LF (GAUZE/BANDAGES/DRESSINGS) ×1 IMPLANT
BLADE SAG 18X100X1.27 (BLADE) ×1 IMPLANT
BLADE SAW SGTL 13.0X1.19X90.0M (BLADE) IMPLANT
BNDG CMPR 9X6 STRL LF SNTH (GAUZE/BANDAGES/DRESSINGS) ×1
BNDG CMPR MED 10X6 ELC LF (GAUZE/BANDAGES/DRESSINGS) ×1
BNDG ELASTIC 6X10 VLCR STRL LF (GAUZE/BANDAGES/DRESSINGS) IMPLANT
BNDG ELASTIC 6X5.8 VLCR STR LF (GAUZE/BANDAGES/DRESSINGS) ×2 IMPLANT
BNDG ESMARK 6X9 LF (GAUZE/BANDAGES/DRESSINGS) ×1
BOWL SMART MIX CTS (DISPOSABLE) IMPLANT
CEMENT BONE R 1X40 (Cement) IMPLANT
COMP PATELLA 32 STD 8.5 THK (Orthopedic Implant) IMPLANT
COMP TIB CMT KNEE C NP LT (Joint) ×1 IMPLANT
COMPONENT TIB CMT KNEE C NP LT (Joint) IMPLANT
COOLER ICEMAN CLASSIC (MISCELLANEOUS) ×1 IMPLANT
COVER SURGICAL LIGHT HANDLE (MISCELLANEOUS) ×1 IMPLANT
CUFF TOURN SGL QUICK 34 (TOURNIQUET CUFF) ×1
CUFF TRNQT CYL 34X4.125X (TOURNIQUET CUFF) ×1 IMPLANT
DRAPE EXTREMITY T 121X128X90 (DISPOSABLE) ×1 IMPLANT
DRAPE HALF SHEET 40X57 (DRAPES) ×1 IMPLANT
DRAPE U-SHAPE 47X51 STRL (DRAPES) ×1 IMPLANT
DRSG XEROFORM 1X8 (GAUZE/BANDAGES/DRESSINGS) IMPLANT
DURAPREP 26ML APPLICATOR (WOUND CARE) ×1 IMPLANT
ELECT CAUTERY BLADE 6.4 (BLADE) ×1 IMPLANT
ELECT REM PT RETURN 9FT ADLT (ELECTROSURGICAL) ×1
ELECTRODE REM PT RTRN 9FT ADLT (ELECTROSURGICAL) ×1 IMPLANT
FACESHIELD WRAPAROUND (MASK) ×3 IMPLANT
FACESHIELD WRAPAROUND OR TEAM (MASK) ×2 IMPLANT
FEMUR CMT CCR STD SZ6 L KNEE (Knees) ×1 IMPLANT
FEMUR CMTD CCR STD SZ6 L KNEE (Knees) IMPLANT
GAUZE PAD ABD 8X10 STRL (GAUZE/BANDAGES/DRESSINGS) ×1 IMPLANT
GAUZE SPONGE 4X4 12PLY STRL (GAUZE/BANDAGES/DRESSINGS) ×1 IMPLANT
GAUZE XEROFORM 1X8 LF (GAUZE/BANDAGES/DRESSINGS) ×1 IMPLANT
GLOVE BIOGEL PI IND STRL 8 (GLOVE) ×2 IMPLANT
GLOVE ORTHO TXT STRL SZ7.5 (GLOVE) ×1 IMPLANT
GLOVE SURG ORTHO 8.0 STRL STRW (GLOVE) ×1 IMPLANT
GOWN STRL REUS W/ TWL LRG LVL3 (GOWN DISPOSABLE) IMPLANT
GOWN STRL REUS W/ TWL XL LVL3 (GOWN DISPOSABLE) ×2 IMPLANT
GOWN STRL REUS W/TWL LRG LVL3 (GOWN DISPOSABLE)
GOWN STRL REUS W/TWL XL LVL3 (GOWN DISPOSABLE) ×2
HANDPIECE INTERPULSE COAX TIP (DISPOSABLE) ×1
IMMOBILIZER KNEE 22 UNIV (SOFTGOODS) ×1 IMPLANT
INSERT TIB KNEE CD/6-9 14 LT (Insert) IMPLANT
IV NS 1000ML (IV SOLUTION) ×1
IV NS 1000ML BAXH (IV SOLUTION) ×1 IMPLANT
KIT BASIN OR (CUSTOM PROCEDURE TRAY) ×1 IMPLANT
KIT TURNOVER KIT B (KITS) ×1 IMPLANT
LINER TIB PS CD/6-9 12 LT (Liner) IMPLANT
MANIFOLD NEPTUNE II (INSTRUMENTS) ×1 IMPLANT
NDL 18GX1X1/2 (RX/OR ONLY) (NEEDLE) IMPLANT
NEEDLE 18GX1X1/2 (RX/OR ONLY) (NEEDLE) IMPLANT
NS IRRIG 1000ML POUR BTL (IV SOLUTION) ×1 IMPLANT
PACK TOTAL JOINT (CUSTOM PROCEDURE TRAY) ×1 IMPLANT
PAD ARMBOARD 7.5X6 YLW CONV (MISCELLANEOUS) ×1 IMPLANT
PAD COLD SHLDR WRAP-ON (PAD) ×1 IMPLANT
PADDING CAST COTTON 6X4 STRL (CAST SUPPLIES) ×1 IMPLANT
PATELLA ZIMMER 32MM (Orthopedic Implant) ×1 IMPLANT
PIN DRILL HDLS TROCAR 75 4PK (PIN) IMPLANT
SCREW FEMALE HEX FIX 25X2.5 (ORTHOPEDIC DISPOSABLE SUPPLIES) IMPLANT
SET HNDPC FAN SPRY TIP SCT (DISPOSABLE) ×1 IMPLANT
SET PAD KNEE POSITIONER (MISCELLANEOUS) ×1 IMPLANT
STAPLER VISISTAT 35W (STAPLE) ×1 IMPLANT
SUCTION TUBE FRAZIER 10FR DISP (SUCTIONS) ×1 IMPLANT
SUT VIC AB 0 CT1 27 (SUTURE) ×1
SUT VIC AB 0 CT1 27XBRD ANBCTR (SUTURE) ×1 IMPLANT
SUT VIC AB 1 CT1 27 (SUTURE) ×2
SUT VIC AB 1 CT1 27XBRD ANBCTR (SUTURE) ×2 IMPLANT
SUT VIC AB 2-0 CT1 27 (SUTURE) ×2
SUT VIC AB 2-0 CT1 TAPERPNT 27 (SUTURE) ×2 IMPLANT
SYR 50ML LL SCALE MARK (SYRINGE) IMPLANT
TOWEL GREEN STERILE (TOWEL DISPOSABLE) ×1 IMPLANT
TOWEL GREEN STERILE FF (TOWEL DISPOSABLE) ×1 IMPLANT
TRAY FOLEY W/BAG SLVR 16FR (SET/KITS/TRAYS/PACK) ×1
TRAY FOLEY W/BAG SLVR 16FR ST (SET/KITS/TRAYS/PACK) IMPLANT

## 2023-02-11 NOTE — Anesthesia Procedure Notes (Signed)
Spinal  Patient location during procedure: OR Start time: 02/11/2023 1:30 PM End time: 02/11/2023 1:34 PM Reason for block: surgical anesthesia Staffing Performed: anesthesiologist  Anesthesiologist: Kaylyn Layer, MD Performed by: Kaylyn Layer, MD Authorized by: Kaylyn Layer, MD   Preanesthetic Checklist Completed: patient identified, IV checked, risks and benefits discussed, surgical consent, monitors and equipment checked, pre-op evaluation and timeout performed Spinal Block Patient position: sitting Prep: DuraPrep and site prepped and draped Patient monitoring: continuous pulse ox, blood pressure and heart rate Approach: midline Location: L3-4 Injection technique: single-shot Needle Needle type: Pencan  Needle gauge: 24 G Needle length: 9 cm Assessment Events: CSF return Additional Notes Risks, benefits, and alternative discussed. Patient gave consent to procedure. Prepped and draped in sitting position. Patient sedated but responsive to voice. Clear CSF obtained after one needle redirection. Positive terminal aspiration. No pain or paraesthesias with injection. Patient tolerated procedure well. Vital signs stable. Amalia Greenhouse, MD

## 2023-02-11 NOTE — Anesthesia Procedure Notes (Signed)
Anesthesia Regional Block: Adductor canal block   Pre-Anesthetic Checklist: , timeout performed,  Correct Patient, Correct Site, Correct Laterality,  Correct Procedure, Correct Position, site marked,  Risks and benefits discussed,  Pre-op evaluation,  At surgeon's request and post-op pain management  Laterality: Left  Prep: Maximum Sterile Barrier Precautions used, chloraprep       Needles:  Injection technique: Single-shot  Needle Type: Echogenic Stimulator Needle     Needle Length: 9cm  Needle Gauge: 22     Additional Needles:   Procedures:,,,, ultrasound used (permanent image in chart),,    Narrative:  Start time: 02/11/2023 12:50 PM End time: 02/11/2023 12:53 PM Injection made incrementally with aspirations every 5 mL.  Performed by: Personally  Anesthesiologist: Kaylyn Layer, MD  Additional Notes: Risks, benefits, and alternative discussed. Patient gave consent for procedure. Patient prepped and draped in sterile fashion. Sedation administered, patient remains easily responsive to voice. Relevant anatomy identified with ultrasound guidance. Local anesthetic given in 5cc increments with no signs or symptoms of intravascular injection. No pain or paraesthesias with injection. Patient monitored throughout procedure with signs of LAST or immediate complications. Tolerated well. Ultrasound image placed in chart.  Amalia Greenhouse, MD

## 2023-02-11 NOTE — Anesthesia Procedure Notes (Signed)
Procedure Name: LMA Insertion Date/Time: 02/11/2023 1:44 PM  Performed by: Caren Macadam, CRNAPre-anesthesia Checklist: Patient identified, Emergency Drugs available, Suction available and Patient being monitored Patient Re-evaluated:Patient Re-evaluated prior to induction Oxygen Delivery Method: Circle system utilized Preoxygenation: Pre-oxygenation with 100% oxygen Induction Type: IV induction Ventilation: Mask ventilation without difficulty LMA: LMA inserted LMA Size: 4.0 Number of attempts: 1 Placement Confirmation: positive ETCO2 and breath sounds checked- equal and bilateral Tube secured with: Tape Dental Injury: Teeth and Oropharynx as per pre-operative assessment

## 2023-02-11 NOTE — Transfer of Care (Signed)
Immediate Anesthesia Transfer of Care Note  Patient: Jill Phillips  Procedure(s) Performed: LEFT TOTAL KNEE ARTHROPLASTY (Left: Knee)  Patient Location: PACU  Anesthesia Type:General  Level of Consciousness: awake, alert , and oriented  Airway & Oxygen Therapy: Patient Spontanous Breathing  Post-op Assessment: Report given to RN and Post -op Vital signs reviewed and stable  Post vital signs: Reviewed and stable  Last Vitals:  Vitals Value Taken Time  BP 115/78 02/11/23 1552  Temp    Pulse 74 02/11/23 1557  Resp 13 02/11/23 1557  SpO2 91 % 02/11/23 1557  Vitals shown include unvalidated device data.  Last Pain:  Vitals:   02/11/23 1310  TempSrc:   PainSc: 0-No pain         Complications: No notable events documented.

## 2023-02-11 NOTE — Op Note (Signed)
Operative Note  Date of operation: 02/11/2023 Preoperative diagnosis: Left knee primary osteoarthritis Postoperative diagnosis: Same  Procedure: Left cemented total knee arthroplasty  Implants: Biomet Zimmer persona PS knee system Implant Name Type Inv. Item Serial No. Manufacturer Lot No. LRB No. Used Action  CEMENT BONE R 1X40 - [id number] Cement CEMENT BONE R 1X40  ZIMMER RECON(ORTH,TRAU,BIO,SG) [id number] Left 1 Implanted  CEMENT BONE R 1X40 - [id number] Cement CEMENT BONE R 1X40  ZIMMER RECON(ORTH,TRAU,BIO,SG) [id number] Left 1 Implanted  FEMUR CMT CCR STD SZ6 L KNEE - [id number] Knees FEMUR CMT CCR STD SZ6 L KNEE  ZIMMER RECON(ORTH,TRAU,BIO,SG) [id number] Left 1 Implanted  PATELLA ZIMMER - [id number] Orthopedic Implant PATELLA ZIMMER  ZIMMER RECON(ORTH,TRAU,BIO,SG) [id number] Left 1 Implanted  PERSONA:THE PERSONALIZED KNEE SYSTEM-0 DEGREE KEEL LEFT, SIZE C CEMENETED TIBIA     [id number] Left 1 Implanted  PERSONA THE PERSONALIZED KNEE SYSTEM VIVACIT HIGHLY CROSSLINKED POLYETHYLENE Knees    [id number] Left 1 Implanted   Surgeon: Vanita Panda. Magnus Ivan, MD Assistant: Darron Doom, RNFA  Anesthesia: #1 left lower extremity adductor canal block, #2 spinal, #3 General, #4 local Tourniquet time: Under 1-1/2 hours EBL: Less than 100 cc Antibiotics: 2 g IV Ancef Complications: None  Indications: The patient is a 67 year old female with debilitating arthritis involving her left knee has been well-documented with clinical exam findings and x-ray findings.  She has tried and failed conservative treatment for over 5 years now.  At this point her left knee pain is daily and it is definitely affecting her mobility, her quality of life and her actives daily living.  She wished to proceed with a total knee arthroplasty at this point and she is actually had a right knee replacement that we did in 2013.  That is done well.  Having had this before she is fully aware of the risk of acute blood loss  anemia, nerve or vessel injury, fracture, infection, DVT and implant failure.  She understands her goals are hopefully decrease pain, improve mobility, and improve quality of life.  Procedure description: After informed consent was obtained and the appropriate left knee was marked, anesthesia obtained a left lower extremity adductor canal block in the holding room.  The patient was then brought to the operating room and set up on the stretcher where spinal anesthesia was attempted.  It was not successful so she was laid in supine position and general anesthesia was obtained.  A Foley catheter was placed and osteoarthritis placed around her upper left thigh and her left thigh, knee, leg and ankle were prepped and draped in DuraPrep and sterile drapes including a sterile stockinette.  A timeout was called and she was identified as correct patient and the correct left knee.  An Esmarch was then used to wrap out the leg and the tourniquet was inflated to 300 mm of pressure.  With the knee extended a midline incision was made over the patella and carried proximally distally.  Dissection was carried down to the knee joint and a medial parapatellar arthrotomy was made finding a moderate joint effusion.  With the knee exposed and flexed there was significant cartilage wear found in the medial compartment and the patellofemoral joint.  Osteophytes removed from all 3 compartments as well as remnants of the ACL and medial lateral meniscus.  We then used extramedullary cutting guide for making her proximal tibia cut correction varus and valgus and neutral slope.  This cut was made to take 2 mm off the low side and we had to  backed this down to more millimeters.  We then went to the femur and used a intramedullary cutting guide for distal femur cut setting this for left knee at 5 degrees externally rotated and 14 mm distal femoral cut.  We had to backed this down to more millimeters as well due to flexion contracture with a 10  mm extension block.  Once we backed this down she seemed to have full extension.  We then went back to the femur and put a femoral sizing guide based off the epicondylar axis.  Based off of this we chose a size 6 femur.  We put a 4-in-1 cutting block for size 6 femur and made her anterior posterior cuts followed by her chamfer cuts.  We then backed the tibia and chose a size C tibial tray for coverage over the tibial plateau for left knee setting the rotation off of the tibial tubercle and femur.  We then did our keel punch and drill hole for this.  We then trialed our size C left tibia followed by our size 6 CR standard femur.  We went up to a 14 mm medial congruent polythene insert and I was not happy with the stability so we elected to proceed with a PS knee.  We put a new cutting guide on the femur so we can make our femoral box cut.  Once we did this we then trialed with a 12 mm thickness PS insert and that did give a stability.  We then made our patella cut and drilled 3 holes for size 32 cemented patella.  We then removed all transportation of the knee and irrigate the knee with normal saline solution.  We then placed Marcaine with epinephrine around the arthrotomy and the cement was mixed.  We then cemented our Biomet Zimmer persona tibial tray for a left knee size C followed by our size 6 right standard PS femur.  We placed a 12 mm PS fixed-bearing polythene insert and cemented our patella button.  Once the cemented hardened I was not pleased with the stability the knee and we removed the 12 mm poly and went up to a 14 mm thickness PS poly and I was pleased with range of motion and stability of that insert.  The tourniquet was then let down and hemostasis was obtained electrocautery.  The arthrotomy was closed with interrupted #1 Vicryl suture followed by 0 Vicryl close deep tissue and 2-0 Vicryl to close subcutaneous tissue.  The skin was closed with staples.  Well-padded sterile dressing was applied.  The  patient was awakened, extubated and taken recovery room in stable condition.

## 2023-02-11 NOTE — Interval H&P Note (Signed)
History and Physical Interval Note: Patient is here today for a left total knee replacement to treat her severe left knee arthritis.  There has been no acute or interval change in her medical status.  The risks and benefits of surgery have been discussed in detail and informed consent is obtained.  The left operative knee has been marked.  02/11/2023 12:37 PM  Jill Phillips  has presented today for surgery, with the diagnosis of osteoarthritis left knee.  The various methods of treatment have been discussed with the patient and family. After consideration of risks, benefits and other options for treatment, the patient has consented to  Procedure(s): LEFT TOTAL KNEE ARTHROPLASTY (Left) as a surgical intervention.  The patient's history has been reviewed, patient examined, no change in status, stable for surgery.  I have reviewed the patient's chart and labs.  Questions were answered to the patient's satisfaction.     Kathryne Hitch

## 2023-02-12 DIAGNOSIS — F17210 Nicotine dependence, cigarettes, uncomplicated: Secondary | ICD-10-CM | POA: Diagnosis present

## 2023-02-12 DIAGNOSIS — M170 Bilateral primary osteoarthritis of knee: Secondary | ICD-10-CM | POA: Diagnosis present

## 2023-02-12 DIAGNOSIS — Z96651 Presence of right artificial knee joint: Secondary | ICD-10-CM | POA: Diagnosis present

## 2023-02-12 DIAGNOSIS — Z9049 Acquired absence of other specified parts of digestive tract: Secondary | ICD-10-CM | POA: Diagnosis not present

## 2023-02-12 DIAGNOSIS — Z881 Allergy status to other antibiotic agents status: Secondary | ICD-10-CM | POA: Diagnosis not present

## 2023-02-12 DIAGNOSIS — M1712 Unilateral primary osteoarthritis, left knee: Secondary | ICD-10-CM | POA: Diagnosis present

## 2023-02-12 DIAGNOSIS — E1122 Type 2 diabetes mellitus with diabetic chronic kidney disease: Secondary | ICD-10-CM | POA: Diagnosis present

## 2023-02-12 DIAGNOSIS — Z8249 Family history of ischemic heart disease and other diseases of the circulatory system: Secondary | ICD-10-CM | POA: Diagnosis not present

## 2023-02-12 DIAGNOSIS — Z96612 Presence of left artificial shoulder joint: Secondary | ICD-10-CM | POA: Diagnosis present

## 2023-02-12 DIAGNOSIS — E039 Hypothyroidism, unspecified: Secondary | ICD-10-CM | POA: Diagnosis present

## 2023-02-12 DIAGNOSIS — G894 Chronic pain syndrome: Secondary | ICD-10-CM | POA: Diagnosis present

## 2023-02-12 DIAGNOSIS — I129 Hypertensive chronic kidney disease with stage 1 through stage 4 chronic kidney disease, or unspecified chronic kidney disease: Secondary | ICD-10-CM | POA: Diagnosis present

## 2023-02-12 DIAGNOSIS — E785 Hyperlipidemia, unspecified: Secondary | ICD-10-CM | POA: Diagnosis present

## 2023-02-12 DIAGNOSIS — J9610 Chronic respiratory failure, unspecified whether with hypoxia or hypercapnia: Secondary | ICD-10-CM | POA: Diagnosis present

## 2023-02-12 DIAGNOSIS — Z79899 Other long term (current) drug therapy: Secondary | ICD-10-CM | POA: Diagnosis not present

## 2023-02-12 DIAGNOSIS — Z79890 Hormone replacement therapy (postmenopausal): Secondary | ICD-10-CM | POA: Diagnosis not present

## 2023-02-12 DIAGNOSIS — Z981 Arthrodesis status: Secondary | ICD-10-CM | POA: Diagnosis not present

## 2023-02-12 DIAGNOSIS — K219 Gastro-esophageal reflux disease without esophagitis: Secondary | ICD-10-CM | POA: Diagnosis present

## 2023-02-12 DIAGNOSIS — G2581 Restless legs syndrome: Secondary | ICD-10-CM | POA: Diagnosis present

## 2023-02-12 DIAGNOSIS — Z96641 Presence of right artificial hip joint: Secondary | ICD-10-CM | POA: Diagnosis present

## 2023-02-12 DIAGNOSIS — N183 Chronic kidney disease, stage 3 unspecified: Secondary | ICD-10-CM | POA: Diagnosis present

## 2023-02-12 DIAGNOSIS — Z90710 Acquired absence of both cervix and uterus: Secondary | ICD-10-CM | POA: Diagnosis not present

## 2023-02-12 DIAGNOSIS — Z885 Allergy status to narcotic agent status: Secondary | ICD-10-CM | POA: Diagnosis not present

## 2023-02-12 DIAGNOSIS — J439 Emphysema, unspecified: Secondary | ICD-10-CM | POA: Diagnosis present

## 2023-02-12 DIAGNOSIS — Z841 Family history of disorders of kidney and ureter: Secondary | ICD-10-CM | POA: Diagnosis not present

## 2023-02-12 LAB — BASIC METABOLIC PANEL
Anion gap: 10 (ref 5–15)
BUN: 11 mg/dL (ref 8–23)
CO2: 25 mmol/L (ref 22–32)
Calcium: 8.2 mg/dL — ABNORMAL LOW (ref 8.9–10.3)
Chloride: 103 mmol/L (ref 98–111)
Creatinine, Ser: 1.11 mg/dL — ABNORMAL HIGH (ref 0.44–1.00)
GFR, Estimated: 54 mL/min — ABNORMAL LOW (ref >60)
Glucose, Bld: 341 mg/dL — ABNORMAL HIGH (ref 70–99)
Potassium: 4.1 mmol/L (ref 3.5–5.1)
Sodium: 138 mmol/L (ref 135–145)

## 2023-02-12 LAB — CBC
HCT: 38 % (ref 36.0–46.0)
Hemoglobin: 12.1 g/dL (ref 12.0–15.0)
MCH: 31.3 pg (ref 26.0–34.0)
MCHC: 31.8 g/dL (ref 30.0–36.0)
MCV: 98.4 fL (ref 80.0–100.0)
Platelets: 192 10*3/uL (ref 150–400)
RBC: 3.86 MIL/uL — ABNORMAL LOW (ref 3.87–5.11)
RDW: 13.1 % (ref 11.5–15.5)
WBC: 11.1 10*3/uL — ABNORMAL HIGH (ref 4.0–10.5)
nRBC: 0 % (ref 0.0–0.2)

## 2023-02-12 LAB — GLUCOSE, CAPILLARY
Glucose-Capillary: 142 mg/dL — ABNORMAL HIGH (ref 70–99)
Glucose-Capillary: 131 mg/dL — ABNORMAL HIGH (ref 70–99)

## 2023-02-12 LAB — HEMOGLOBIN A1C
Hgb A1c MFr Bld: 5.8 % — ABNORMAL HIGH (ref 4.8–5.6)
Mean Plasma Glucose: 119.76 mg/dL

## 2023-02-12 MED ORDER — PHENYLEPHRINE HCL-NACL 20-0.9 MG/250ML-% IV SOLN
INTRAVENOUS | Status: AC
  Filled 2023-02-12: qty 250

## 2023-02-12 MED ORDER — INSULIN ASPART 100 UNIT/ML IJ SOLN
0.0000 [IU] | Freq: Three times a day (TID) | INTRAMUSCULAR | Status: DC
  Administered 2023-02-12 – 2023-02-13 (×2): 2 [IU] via SUBCUTANEOUS

## 2023-02-12 MED ORDER — ROPINIROLE HCL 0.5 MG PO TABS
1.0000 mg | ORAL_TABLET | Freq: Two times a day (BID) | ORAL | Status: DC | PRN
  Administered 2023-02-12: 1 mg via ORAL
  Filled 2023-02-12: qty 2

## 2023-02-12 MED ORDER — KETOROLAC TROMETHAMINE 15 MG/ML IJ SOLN
15.0000 mg | Freq: Four times a day (QID) | INTRAMUSCULAR | Status: AC
  Administered 2023-02-13 (×3): 15 mg via INTRAVENOUS
  Filled 2023-02-12 (×3): qty 1

## 2023-02-12 MED ORDER — INSULIN ASPART 100 UNIT/ML IJ SOLN: 0.0000 [IU] | Freq: Every day | INTRAMUSCULAR | Status: DC

## 2023-02-12 MED ORDER — NICOTINE 21 MG/24HR TD PT24
21.0000 mg | MEDICATED_PATCH | Freq: Every day | TRANSDERMAL | Status: DC
  Administered 2023-02-12 – 2023-02-13 (×2): 21 mg via TRANSDERMAL
  Filled 2023-02-12 (×2): qty 1

## 2023-02-12 MED ORDER — ACETAMINOPHEN 10 MG/ML IV SOLN
INTRAVENOUS | Status: AC
  Filled 2023-02-12: qty 100

## 2023-02-12 NOTE — Progress Notes (Signed)
Subjective: 1 Day Post-Op Procedure(s) (LRB): LEFT TOTAL KNEE ARTHROPLASTY (Left) Patient reports pain as 9 on 0-10 scale.    Objective: Vital signs in last 24 hours: Temp:  [97.5 F (36.4 C)-98.6 F (37 C)] 98.2 F (36.8 C) (06/12 0217) Pulse Rate:  [57-72] 65 (06/12 0217) Resp:  [10-20] 20 (06/12 0217) BP: (95-135)/(55-81) 106/60 (06/12 0217) SpO2:  [91 %-99 %] 93 % (06/12 0217) Weight:  [63 kg] 63 kg (06/11 1148)  Intake/Output from previous day: 06/11 0701 - 06/12 0700 In: 1440 [P.O.:240; I.V.:1000; IV Piggyback:200] Out: 2100 [Urine:2050; Blood:50] Intake/Output this shift: No intake/output data recorded.  Recent Labs    02/12/23 0104  HGB 12.1   Recent Labs    02/12/23 0104  WBC 11.1*  RBC 3.86*  HCT 38.0  PLT 192   Recent Labs    02/12/23 0104  NA 138  K 4.1  CL 103  CO2 25  BUN 11  CREATININE 1.11*  GLUCOSE 341*  CALCIUM 8.2*   No results for input(s): "LABPT", "INR" in the last 72 hours.  Sensation intact distally Intact pulses distally Dorsiflexion/Plantar flexion intact Incision: scant drainage Compartment soft   Assessment/Plan: 1 Day Post-Op Procedure(s) (LRB): LEFT TOTAL KNEE ARTHROPLASTY (Left) Up with therapy      Kathryne Hitch 02/12/2023, 7:17 AM

## 2023-02-12 NOTE — Inpatient Diabetes Management (Signed)
Inpatient Diabetes Program Recommendations  AACE/ADA: New Consensus Statement on Inpatient Glycemic Control (2015)  Target Ranges:  Prepandial:   less than 140 mg/dL      Peak postprandial:   less than 180 mg/dL (1-2 hours)      Critically ill patients:  140 - 180 mg/dL   Lab Results  Component Value Date   GLUCAP 91 02/04/2023   HGBA1C 6.1 12/18/2022    Review of Glycemic Control  Latest Reference Range & Units 02/12/23 01:04  Glucose 70 - 99 mg/dL 161 (H)   Diabetes history: None noted Current orders for Inpatient glycemic control:  None  Inpatient Diabetes Program Recommendations:    Note elevated lab glucose on 6/11.  May consider verifying glucose with finger stick? If greater than 150 mg/dL, consider W9U and also Novolog sensitive correction tid with  meals.   Thanks,  Beryl Meager, RN, BC-ADM Inpatient Diabetes Coordinator Pager [phone]  (8a-5p)

## 2023-02-12 NOTE — Anesthesia Postprocedure Evaluation (Signed)
Anesthesia Post Note  Patient: Jill Phillips  Procedure(s) Performed: LEFT TOTAL KNEE ARTHROPLASTY (Left: Knee)     Patient location during evaluation: PACU Anesthesia Type: General Level of consciousness: awake and alert Pain management: pain level controlled Vital Signs Assessment: post-procedure vital signs reviewed and stable Respiratory status: spontaneous breathing, nonlabored ventilation and respiratory function stable Cardiovascular status: blood pressure returned to baseline and stable Postop Assessment: no apparent nausea or vomiting Anesthetic complications: no   No notable events documented.  Last Vitals:  Vitals:   02/12/23 0217 02/12/23 0736  BP: 106/60 121/61  Pulse: 65 75  Resp: 20 18  Temp: 36.8 C 36.9 C  SpO2: 93% 95%    Last Pain:  Vitals:   02/12/23 0736  TempSrc: Oral  PainSc:                  Lowella Curb

## 2023-02-12 NOTE — Progress Notes (Signed)
Patient ID: Jill Phillips, female   DOB: 28-Nov-1955, 67 y.o.   MRN: [id number] The patient will be definitely staying tonight in order to maximize her mobility with therapy and work on pain control.  I will change her to an inpatient admission.

## 2023-02-12 NOTE — Progress Notes (Signed)
Physical Therapy Treatment Patient Details Name: Jill Phillips MRN: [id number] DOB: Sep 02, 1956 Today's Date: 02/12/2023   History of Present Illness 67 yo female with onset of failed conservative management of L knee received TKA on 6.11. Pt is noting discomfort and limited mobility and referred to rehab for preparation to get home.  PMHx:  R TKA, R THA, back surgery with residual RLE numbness, anxiety, COPD, smoker, emphysema, reverse TSA, DM, CKD, RLS, hypoxia, L pneumothorax, opioid use    PT Comments    Pt was seen for PM session with review of ROM and strengthening to LLE, then walked to BR.  Pt is expecting to go home tomorrow, has voided from removal of cath and is demonstrating more stability with the walker and the LLE in particular.  Will recommend her to try an step for curbing next visit, and continue to review her range of motion routine.  Follow for acute PT goals as outlined on POC.   Recommendations for follow up therapy are one component of a multi-disciplinary discharge planning process, led by the attending physician.  Recommendations may be updated based on patient status, additional functional criteria and insurance authorization.  Follow Up Recommendations       Assistance Recommended at Discharge Intermittent Supervision/Assistance  Patient can return home with the following A little help with walking and/or transfers;A little help with bathing/dressing/bathroom;Assistance with cooking/housework;Help with stairs or ramp for entrance;Assist for transportation   Equipment Recommendations  BSC/3in1    Recommendations for Other Services       Precautions / Restrictions Precautions Precautions: Fall;Knee Precaution Comments: verbally reviewed Restrictions Weight Bearing Restrictions: Yes LLE Weight Bearing: Weight bearing as tolerated     Mobility  Bed Mobility Overal bed mobility: Needs Assistance Bed Mobility: Supine to Sit     Supine to sit: Min assist      General bed mobility comments: pt was supervised and used belt to move LLE    Transfers Overall transfer level: Needs assistance Equipment used: Rolling walker (2 wheels), 1 person hand held assist Transfers: Sit to/from Stand Sit to Stand: Supervision           General transfer comment: min assist to stand and balance with RW, able to steady on L knee    Ambulation/Gait Ambulation/Gait assistance: Min guard Gait Distance (Feet): 35 Feet Assistive device: Rolling walker (2 wheels), 1 person hand held assist Gait Pattern/deviations: Step-through pattern, Wide base of support, Decreased weight shift to left Gait velocity: reduced Gait velocity interpretation: <1.31 ft/sec, indicative of household ambulator Pre-gait activities: posture correction and balance General Gait Details: taking her time and using care to WB on LLE, good effort and was observed by ortho nurse   Stairs             Wheelchair Mobility    Modified Rankin (Stroke Patients Only)       Balance Overall balance assessment: Needs assistance Sitting-balance support: Feet supported Sitting balance-Leahy Scale: Good     Standing balance support: Bilateral upper extremity supported, During functional activity Standing balance-Leahy Scale: Poor                              Cognition Arousal/Alertness: Awake/alert Behavior During Therapy: WFL for tasks assessed/performed Overall Cognitive Status: Within Functional Limits for tasks assessed  General Comments: discussion of her needs for home        Exercises Total Joint Exercises Ankle Circles/Pumps: AROM, 5 reps Quad Sets: AROM, 10 reps Gluteal Sets: AROM, 10 reps Long Arc Quad: AAROM, 10 reps Knee Flexion: AAROM, 10 reps Goniometric ROM: 75    General Comments General comments (skin integrity, edema, etc.): Pt was assisted to side of bed and to BR, voided and then discussed  her goals for home      Pertinent Vitals/Pain Pain Assessment Pain Assessment: Faces Faces Pain Scale: Hurts little more Pain Location: L knee Pain Descriptors / Indicators: Grimacing, Guarding Pain Intervention(s): Monitored during session, Repositioned, Premedicated before session, Ice applied    Home Living Family/patient expects to be discharged to:: Private residence Living Arrangements: Alone Available Help at Discharge: Friend(s);Family;Available PRN/intermittently Type of Home: Apartment Home Access: Level entry       Home Layout: One level Home Equipment: Agricultural consultant (2 wheels);Cane - single point Additional Comments: agreed to 3 in one commode seat    Prior Function            PT Goals (current goals can now be found in the care plan section) Acute Rehab PT Goals Patient Stated Goal: to get home with family assist PT Goal Formulation: With patient Time For Goal Achievement: 02/19/23 Potential to Achieve Goals: Good    Frequency    7X/week      PT Plan Current plan remains appropriate    Co-evaluation              AM-PAC PT "6 Clicks" Mobility   Outcome Measure  Help needed turning from your back to your side while in a flat bed without using bedrails?: A Little Help needed moving from lying on your back to sitting on the side of a flat bed without using bedrails?: A Little Help needed moving to and from a bed to a chair (including a wheelchair)?: A Little Help needed standing up from a chair using your arms (e.g., wheelchair or bedside chair)?: A Little Help needed to walk in hospital room?: A Little Help needed climbing 3-5 steps with a railing? : A Lot 6 Click Score: 17    End of Session Equipment Utilized During Treatment: Gait belt Activity Tolerance: Patient tolerated treatment well Patient left: with call bell/phone within reach;in bed;with bed alarm set Nurse Communication: Mobility status PT Visit Diagnosis: Unsteadiness on  feet (R26.81);Muscle weakness (generalized) (M62.81);Difficulty in walking, not elsewhere classified (R26.2);Pain Pain - Right/Left: Left Pain - part of body: Knee     Time: 0981-1914 PT Time Calculation (min) (ACUTE ONLY): 24 min  Charges:  $Gait Training: 8-22 mins $Therapeutic Exercise: 8-22 mins   Ivar Drape 02/12/2023, 3:21 PM  Samul Dada, PT PhD Acute Rehab Dept. Number: [facility] [id number] and [facility] [phone]

## 2023-02-12 NOTE — Progress Notes (Signed)
Physical Therapy Evaluation Patient Details Name: Jill Phillips MRN: [id number] DOB: Mar 11, 1956 Today's Date: 02/12/2023  History of Present Illness  67 yo female with onset of failed conservative management of L knee received TKA on 6.11. Pt is noting discomfort and limited mobility and referred to rehab for preparation to get home.  PMHx:  R TKA, R THA, back surgery with residual RLE numbness, anxiety, COPD, smoker, emphysema, reverse TSA, DM, CKD, RLS, hypoxia, L pneumothorax, opioid use  Clinical Impression  Pt was seen for mobility on RW to move in the room, to do ROM to L knee in chair and to discuss her PT goals.  Pt is in a living situation with independent living, has fairly level layout and will need to get up a curbing only.  Has plan for several days of family help initially then will be a bit more sporadic.  Follow acutely for recovery of gait and transfers to make transition home safe and manageable, with follow up plan up to surgeon for details.         Recommendations for follow up therapy are one component of a multi-disciplinary discharge planning process, led by the attending physician.  Recommendations may be updated based on patient status, additional functional criteria and insurance authorization.  Follow Up Recommendations       Assistance Recommended at Discharge Intermittent Supervision/Assistance  Patient can return home with the following  A little help with walking and/or transfers;A little help with bathing/dressing/bathroom;Assistance with cooking/housework;Help with stairs or ramp for entrance;Assist for transportation    Equipment Recommendations BSC/3in1  Recommendations for Other Services       Functional Status Assessment Patient has had a recent decline in their functional status and demonstrates the ability to make significant improvements in function in a reasonable and predictable amount of time.     Precautions / Restrictions  Precautions Precautions: Fall;Knee Precaution Comments: verbally reviewed Restrictions Weight Bearing Restrictions: Yes LLE Weight Bearing: Weight bearing as tolerated      Mobility  Bed Mobility Overal bed mobility: Needs Assistance Bed Mobility: Supine to Sit     Supine to sit: Min assist     General bed mobility comments: Minor  help to move and pt uses belt to lift LLE off bed effectively    Transfers Overall transfer level: Needs assistance Equipment used: Rolling walker (2 wheels), 1 person hand held assist Transfers: Sit to/from Stand Sit to Stand: Min assist           General transfer comment: min assist to stand and balance with RW, able to steady on L knee    Ambulation/Gait Ambulation/Gait assistance: Min guard Gait Distance (Feet): 40 Feet Assistive device: Rolling walker (2 wheels), 1 person hand held assist Gait Pattern/deviations: Step-through pattern, Wide base of support, Decreased weight shift to left Gait velocity: reduced Gait velocity interpretation: <1.31 ft/sec, indicative of household ambulator Pre-gait activities: standing balance ck General Gait Details: taking her time and using care to WB on LLE, good effort and was observed by ortho nurse  Stairs            Wheelchair Mobility    Modified Rankin (Stroke Patients Only)       Balance Overall balance assessment: Needs assistance Sitting-balance support: Feet supported Sitting balance-Leahy Scale: Good     Standing balance support: Bilateral upper extremity supported, During functional activity Standing balance-Leahy Scale: Poor  Pertinent Vitals/Pain Pain Assessment Pain Assessment: Faces Faces Pain Scale: Hurts little more Pain Location: L knee Pain Descriptors / Indicators: Grimacing, Guarding Pain Intervention(s): Limited activity within patient's tolerance, Monitored during session, Premedicated before session,  Repositioned, Ice applied (dislikes ice)    Home Living Family/patient expects to be discharged to:: Private residence Living Arrangements: Alone Available Help at Discharge: Friend(s);Family;Available PRN/intermittently Type of Home: Apartment Home Access: Level entry       Home Layout: One level Home Equipment: Agricultural consultant (2 wheels);Cane - single point Additional Comments: agreed to 3 in one commode seat    Prior Function Prior Level of Function : Independent/Modified Independent             Mobility Comments: living alone, using walker and mobilizing independently       Hand Dominance   Dominant Hand: Right    Extremity/Trunk Assessment   Upper Extremity Assessment Upper Extremity Assessment: Overall WFL for tasks assessed    Lower Extremity Assessment Lower Extremity Assessment: LLE deficits/detail LLE Deficits / Details: stiffness and edema on L knee LLE Coordination: decreased gross motor    Cervical / Trunk Assessment Cervical / Trunk Assessment: Normal  Communication   Communication: No difficulties  Cognition Arousal/Alertness: Awake/alert Behavior During Therapy: WFL for tasks assessed/performed Overall Cognitive Status: Within Functional Limits for tasks assessed                                 General Comments: Pt is able to verbalize her history        General Comments General comments (skin integrity, edema, etc.): Pt is up to walk with sats checked as she was on 2L O2 from post op, noted 91% while walking on room air.    Exercises Total Joint Exercises Long Arc Quad: AAROM, 10 reps Knee Flexion: AAROM, 10 reps Goniometric ROM: 75   Assessment/Plan    PT Assessment Patient needs continued PT services  PT Problem List Decreased strength;Decreased range of motion;Decreased activity tolerance;Decreased balance;Decreased mobility;Decreased coordination;Decreased knowledge of use of DME;Decreased skin integrity;Pain        PT Treatment Interventions DME instruction;Gait training;Functional mobility training;Therapeutic activities;Therapeutic exercise;Balance training;Stair training;Neuromuscular re-education;Patient/family education    PT Goals (Current goals can be found in the Care Plan section)  Acute Rehab PT Goals Patient Stated Goal: to get home with family assist PT Goal Formulation: With patient Time For Goal Achievement: 02/19/23 Potential to Achieve Goals: Good    Frequency 7X/week     Co-evaluation               AM-PAC PT "6 Clicks" Mobility  Outcome Measure Help needed turning from your back to your side while in a flat bed without using bedrails?: A Little Help needed moving from lying on your back to sitting on the side of a flat bed without using bedrails?: A Little Help needed moving to and from a bed to a chair (including a wheelchair)?: A Little Help needed standing up from a chair using your arms (e.g., wheelchair or bedside chair)?: A Little Help needed to walk in hospital room?: A Little Help needed climbing 3-5 steps with a railing? : A Lot 6 Click Score: 17    End of Session Equipment Utilized During Treatment: Gait belt Activity Tolerance: Patient tolerated treatment well Patient left: in chair;with call bell/phone within reach;with chair alarm set Nurse Communication: Mobility status PT Visit Diagnosis: Unsteadiness on feet (R26.81);Muscle weakness (  generalized) (M62.81);Difficulty in walking, not elsewhere classified (R26.2);Pain Pain - Right/Left: Left Pain - part of body: Knee    Time: 0981-1914 PT Time Calculation (min) (ACUTE ONLY): 29 min   Charges:   PT Evaluation $PT Eval Moderate Complexity: 1 Mod PT Treatments $Gait Training: 8-22 mins       Ivar Drape 02/12/2023, 3:14 PM  Samul Dada, PT PhD Acute Rehab Dept. Number: [facility] [id number] and [facility] [phone]

## 2023-02-12 NOTE — Discharge Instructions (Signed)

## 2023-02-13 ENCOUNTER — Encounter (HOSPITAL_COMMUNITY): Payer: Self-pay | Admitting: Orthopaedic Surgery

## 2023-02-13 LAB — GLUCOSE, CAPILLARY
Glucose-Capillary: 93 mg/dL (ref 70–99)
Glucose-Capillary: 147 mg/dL — ABNORMAL HIGH (ref 70–99)

## 2023-02-13 MED ORDER — OXYCODONE HCL 5 MG PO TABS: 5.0000 mg | ORAL_TABLET | ORAL | 0 refills | Status: DC | PRN

## 2023-02-13 MED ORDER — ROPINIROLE HCL 1 MG PO TABS: 1.0000 mg | ORAL_TABLET | Freq: Two times a day (BID) | ORAL | 0 refills | Status: AC | PRN

## 2023-02-13 MED ORDER — TIZANIDINE HCL 4 MG PO TABS
4.0000 mg | ORAL_TABLET | Freq: Four times a day (QID) | ORAL | 0 refills | Status: DC | PRN
Start: 2023-02-13 — End: 2023-03-27

## 2023-02-13 NOTE — Progress Notes (Signed)
Subjective: 2 Days Post-Op Procedure(s) (LRB): LEFT TOTAL KNEE ARTHROPLASTY (Left) Patient reports pain as 10 on 0-10 scale.    Objective: Vital signs in last 24 hours: Temp:  [98.4 F (36.9 C)-98.8 F (37.1 C)] 98.8 F (37.1 C) (06/13 0714) Pulse Rate:  [64-78] 64 (06/13 0714) Resp:  [16-18] 17 (06/13 0714) BP: (106-132)/(59-69) 106/62 (06/13 0714) SpO2:  [92 %-99 %] 99 % (06/13 0714)  Intake/Output from previous day: No intake/output data recorded. Intake/Output this shift: No intake/output data recorded.  Recent Labs    02/12/23 0104  HGB 12.1   Recent Labs    02/12/23 0104  WBC 11.1*  RBC 3.86*  HCT 38.0  PLT 192   Recent Labs    02/12/23 0104  NA 138  K 4.1  CL 103  CO2 25  BUN 11  CREATININE 1.11*  GLUCOSE 341*  CALCIUM 8.2*   No results for input(s): "LABPT", "INR" in the last 72 hours.  Sensation intact distally Intact pulses distally Dorsiflexion/Plantar flexion intact Incision: dressing C/D/I No cellulitis present Compartment soft   Assessment/Plan: 2 Days Post-Op Procedure(s) (LRB): LEFT TOTAL KNEE ARTHROPLASTY (Left) Up with therapy Discharge home with home health this afternoon      Kathryne Hitch 02/13/2023, 10:25 AM

## 2023-02-13 NOTE — TOC Initial Note (Signed)
Transition of Care [facility]) - Initial/Assessment Note    Patient Details  Name: Jill Phillips MRN: [id number] Date of Birth: [birth year]  Transition of Care [facility]) CM/SW Contact:    Epifanio Lesches, RN Phone Number: 02/13/2023, 10:27 AM  Clinical Narrative:                    -s/p L total knee arthroplasty From home alone. States daughter to assist with care once d/c. Pt already with DME RW @ home. Referral made with [employer] for 3in1/ BSC. Equipment will be delivered to bed prior to d/c. Provider's office prearranged home health PT services with [facility] , start of care with 48 hours post d/c. Pt states without transportation or RX med concerns. TOC following and will continue assisting with need....  Expected Discharge Plan: Home w Home Health Services Barriers to Discharge: Continued Medical Work up   Patient Goals and CMS Choice     Choice offered to / list presented to : Patient      Expected Discharge Plan and Services   Discharge Planning Services: CM Consult                       DME Agency: [employer] Date DME Agency Contacted: 02/13/23 Time DME Agency Contacted: 1025 Representative spoke with at DME Agency: Vaughan Basta HH Arranged: PT HH Agency: [facility] Date [facility] Agency Contacted: 02/13/23 Time HH Agency Contacted: 1027 Representative spoke with at [facility] Agency: Amy  Prior Living Arrangements/Services   Lives with:: Self Patient language and need for interpreter reviewed:: Yes Do you feel safe going back to the place where you live?: Yes      Need for Family Participation in Patient Care: Yes (Comment) Care giver support system in place?: Yes (comment)   Criminal Activity/Legal Involvement Pertinent to Current Situation/Hospitalization: No - Comment as needed  Activities of Daily Living      Permission Sought/Granted                  Emotional Assessment Appearance:: Appears stated age Attitude/Demeanor/Rapport:  Engaged Affect (typically observed): Accepting Orientation: : Oriented to Self, Oriented to Place, Oriented to  Time, Oriented to Situation Alcohol / Substance Use: Not Applicable Psych Involvement: No (comment)  Admission diagnosis:  Status post total left knee replacement [Z96.652] Patient Active Problem List   Diagnosis Date Noted   Status post total left knee replacement 02/11/2023   Urination decrease 12/18/2022   Chronic pelvic pain in female 09/11/2022   Pain of left thumb 09/11/2022   Chest pain 07/30/2022   Unilateral primary osteoarthritis, left knee 01/09/2022   Onychomycosis 10/04/2021   Itching 07/16/2021   AKI (acute kidney injury) (HCC) 02/04/2021   Acute gout 06/20/2020   Interstitial cystitis 01/21/2020   Status post shoulder surgery 09/09/2019   Nausea 08/19/2019   Anxiety 05/04/2019   Insomnia 05/04/2019   Syncope 12/08/2018   Disorder of rotator cuff syndrome of left shoulder and allied disorder 06/22/2018   Bilateral leg edema 06/04/2018   Type 2 diabetes mellitus without complication, without long-term current use of insulin (HCC) 12/02/2017   Sacral pain 09/17/2017   COPD (chronic obstructive pulmonary disease) (HCC) 04/21/2017   Lower back pain 03/03/2017   CKD (chronic kidney disease) stage 3, GFR 30-59 ml/min (HCC) 08/07/2016   GERD (gastroesophageal reflux disease) 08/07/2016   Chronic respiratory failure (HCC) 07/29/2016   Arthritis of carpometacarpal (CMC) joint of left thumb 07/09/2016  Pneumothorax on left 01/31/2016   Chronic, continuous use of opioids 09/06/2015   Degenerative disc disease, lumbar 08/01/2015    Class: Chronic   Spondylolisthesis of lumbar region 08/01/2015    Class: Chronic   Hypoxia 09/14/2014   Constipation 05/05/2014   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/14/2013   HTN (hypertension) 05/19/2013   HLD (hyperlipidemia) 05/19/2013   Depression 05/19/2013   Hypothyroidism 05/19/2013   Tobacco abuse    Osteoarthritis of both knees  11/18/2011   RLS (restless legs syndrome) 11/18/2011   PCP:  Pincus Sanes, MD Pharmacy:   [facility] - [place], [place] - [address] [place] [address] Phone: [phone] Fax: [phone]  [facility] [address] [place] - [address] [place] [address] Phone: [phone] Fax: [phone]  [facility] [address] [place] [address] Phone: [phone] Fax: [phone]  [id number] - [place], [place] - [address] [address] Phone: [phone] Fax: [phone]     Social Determinants of Health (SDOH) Social History: SDOH Screenings   Food Insecurity: No Food Insecurity (07/15/2022)  Housing: Low Risk  (07/15/2022)  Transportation Needs: No Transportation Needs (07/15/2022)  Utilities: Not At Risk (07/15/2022)  Alcohol Screen: Low Risk  (07/15/2022)  Depression (PHQ2-9): Low Risk  (12/18/2022)  Financial Resource Strain: Low Risk  (07/15/2022)  Physical Activity: Inactive (07/15/2022)  Social Connections: Socially Isolated (07/15/2022)  Stress: No Stress Concern Present (07/15/2022)  Tobacco Use: High Risk (02/11/2023)   SDOH Interventions:     Readmission Risk Interventions     No data to display

## 2023-02-13 NOTE — Progress Notes (Signed)
Physical Therapy Treatment Patient Details Name: Jill Phillips MRN: [id number] DOB: 06/21/1956 Today's Date: 02/13/2023   History of Present Illness 67 yo female with onset of failed conservative management of L knee received TKA on 6.11. Pt is noting discomfort and limited mobility and referred to rehab for preparation to get home.  PMHx:  R TKA, R THA, back surgery with residual RLE numbness, anxiety, COPD, smoker, emphysema, reverse TSA, DM, CKD, RLS, hypoxia, L pneumothorax, opioid use    PT Comments    After refusing AM session pt was agreeable to PM session to get her daughter up to speed with her mobiltiy and use of all equipment.  The patient's daughter clearly knows pt well, and was easily able to follow along and verbalize agreement with all elements of her care.  Encouraged pt to have her family call back to hosp if further questions occurred, and otherwise will see HHPT for continued care in that setting.   Recommendations for follow up therapy are one component of a multi-disciplinary discharge planning process, led by the attending physician.  Recommendations may be updated based on patient status, additional functional criteria and insurance authorization.  Follow Up Recommendations       Assistance Recommended at Discharge Intermittent Supervision/Assistance  Patient can return home with the following A little help with walking and/or transfers;A little help with bathing/dressing/bathroom;Assistance with cooking/housework;Help with stairs or ramp for entrance;Assist for transportation   Equipment Recommendations  BSC/3in1    Recommendations for Other Services       Precautions / Restrictions Precautions Precautions: Fall;Knee Precaution Comments: verbally reviewed Restrictions Weight Bearing Restrictions: Yes LLE Weight Bearing: Weight bearing as tolerated     Mobility  Bed Mobility Overal bed mobility: Modified Independent Bed Mobility: Supine to Sit            General bed mobility comments: taking her time    Transfers Overall transfer level: Needs assistance Equipment used: Rolling walker (2 wheels), 1 person hand held assist Transfers: Sit to/from Stand Sit to Stand: Supervision                Ambulation/Gait Ambulation/Gait assistance: Supervision Gait Distance (Feet): 45 Feet Assistive device: Rolling walker (2 wheels), 1 person hand held assist Gait Pattern/deviations: Step-through pattern, Decreased stride length, Decreased weight shift to left Gait velocity: reduced Gait velocity interpretation: <1.31 ft/sec, indicative of household ambulator   General Gait Details: improved quality of balancing and using walker for support   Stairs             Wheelchair Mobility    Modified Rankin (Stroke Patients Only)       Balance     Sitting balance-Leahy Scale: Good       Standing balance-Leahy Scale: Fair Standing balance comment: once set                            Cognition Arousal/Alertness: Awake/alert Behavior During Therapy: WFL for tasks assessed/performed Overall Cognitive Status: Within Functional Limits for tasks assessed                                 General Comments: talked with daughter to instruct her for home including polar care, safety with walker and car transfers        Exercises Total Joint Exercises Ankle Circles/Pumps: AROM, 5 reps Quad Sets: AROM, 10 reps Gluteal Sets: AROM, 10  reps    General Comments General comments (skin integrity, edema, etc.): instruction to daugther as pt is reviewing her transfers, gait and use of equipment with HEP as well      Pertinent Vitals/Pain Pain Assessment Pain Assessment: Faces Faces Pain Scale: Hurts little more Pain Location: L knee Pain Descriptors / Indicators: Guarding Pain Intervention(s): Limited activity within patient's tolerance, Monitored during session, Repositioned    Home Living                           Prior Function            PT Goals (current goals can now be found in the care plan section) Acute Rehab PT Goals Patient Stated Goal: get pain managed Progress towards PT goals: Progressing toward goals    Frequency    7X/week      PT Plan Current plan remains appropriate    Co-evaluation              AM-PAC PT "6 Clicks" Mobility   Outcome Measure  Help needed turning from your back to your side while in a flat bed without using bedrails?: A Little Help needed moving from lying on your back to sitting on the side of a flat bed without using bedrails?: A Little Help needed moving to and from a bed to a chair (including a wheelchair)?: A Little Help needed standing up from a chair using your arms (e.g., wheelchair or bedside chair)?: A Little Help needed to walk in hospital room?: A Little Help needed climbing 3-5 steps with a railing? : A Little 6 Click Score: 18    End of Session Equipment Utilized During Treatment: Gait belt Activity Tolerance: Patient tolerated treatment well Patient left: with call bell/phone within reach;in bed;with bed alarm set Nurse Communication: Mobility status PT Visit Diagnosis: Unsteadiness on feet (R26.81);Muscle weakness (generalized) (M62.81);Difficulty in walking, not elsewhere classified (R26.2);Pain Pain - Right/Left: Left Pain - part of body: Knee     Time: 1610-9604 PT Time Calculation (min) (ACUTE ONLY): 34 min  Charges:  $Gait Training: 8-22 mins $Therapeutic Exercise: 8-22 mins            Ivar Drape 02/13/2023, 4:28 PM  Samul Dada, PT PhD Acute Rehab Dept. Number: [facility] [id number] and [facility] [phone]

## 2023-02-13 NOTE — Discharge Summary (Signed)
Patient ID: Jill Phillips MRN: [id number] DOB/AGE: 1955/10/25 67 y.o.  Admit date: 02/11/2023 Discharge date: 02/13/2023  Admission Diagnoses:  Principal Problem:   Unilateral primary osteoarthritis, left knee Active Problems:   Status post total left knee replacement   Discharge Diagnoses:  Same  Past Medical History:  Diagnosis Date   Active smoker    Anxiety    Calcifying tendinitis of shoulder    Chronic back pain    Chronic kidney disease 2022   related to potential overdose   Chronic pain syndrome    COPD (chronic obstructive pulmonary disease) (HCC)    Dyspnea    with exertion   Dysthymic disorder    Emphysema lung (HCC)    GERD (gastroesophageal reflux disease)    Heart murmur    for years, nothing to be concerned about   Herpes genitalia    History of blood transfusion 09/02/1978   related to "back surgery"   Hyperlipidemia    Hypertension    Hypothyroidism    Lumbago    Osteoarthrosis, unspecified whether generalized or localized, lower leg    Pain in joint, upper arm    Pneumothorax, left 01/31/2016   S/P Left posterior subcostal pain injection on 01/30/2016   PONV (postoperative nausea and vomiting)    gets nauseous  with longer surgery. Difficuty voiding after surgery   Postlaminectomy syndrome, thoracic region    Pre-diabetes    Primary localized osteoarthrosis, lower leg    Restless leg syndrome    Sleep apnea    s/p surgery- last sleep study 2011- doesnt use oxygen or machine at night as instructed,.   12/2014- Dr Craige Cotta  reports it is negative.   Thyroid disease     Surgeries: Procedure(s): LEFT TOTAL KNEE ARTHROPLASTY on 02/11/2023   Consultants:   Discharged Condition: Improved  Hospital Course: HEDI VANDEMAN is an 67 y.o. female who was admitted 02/11/2023 for operative treatment ofUnilateral primary osteoarthritis, left knee. Patient has severe unremitting pain that affects sleep, daily activities, and work/hobbies. After pre-op clearance  the patient was taken to the operating room on 02/11/2023 and underwent  Procedure(s): LEFT TOTAL KNEE ARTHROPLASTY.    Patient was given perioperative antibiotics:  Anti-infectives (From admission, onward)    Start     Dose/Rate Route Frequency Ordered Stop   02/12/23 1000  valACYclovir (VALTREX) tablet 500 mg        500 mg Oral Daily 02/11/23 1855     02/11/23 2000  ceFAZolin (ANCEF) IVPB 1 g/50 mL premix        1 g 100 mL/hr over 30 Minutes Intravenous Every 6 hours 02/11/23 1855 02/13/23 0123   02/11/23 1200  ceFAZolin (ANCEF) IVPB 2g/100 mL premix        2 g 200 mL/hr over 30 Minutes Intravenous On call to O.R. 02/11/23 1153 02/11/23 1336   02/11/23 1159  ceFAZolin (ANCEF) 2-4 GM/100ML-% IVPB       Note to Pharmacy: Kathrene Bongo D: cabinet override      02/11/23 1159 02/11/23 1411        Patient was given sequential compression devices, early ambulation, and chemoprophylaxis to prevent DVT.  Patient benefited maximally from hospital stay and there were no complications.    Recent vital signs: Patient Vitals for the past 24 hrs:  BP Temp Temp src Pulse Resp SpO2  02/13/23 0714 106/62 98.8 F (37.1 C) Oral 64 17 99 %  02/13/23 0351 (!) 117/59 98.6 F (37 C) Oral 65 18 95 %  02/12/23 2351 (!) 108/59 -- -- 72 16 94 %  02/12/23 1228 132/69 98.4 F (36.9 C) Oral 78 18 92 %     Recent laboratory studies:  Recent Labs    02/12/23 0104  WBC 11.1*  HGB 12.1  HCT 38.0  PLT 192  NA 138  K 4.1  CL 103  CO2 25  BUN 11  CREATININE 1.11*  GLUCOSE 341*  CALCIUM 8.2*     Discharge Medications:   Allergies as of 02/13/2023       Reactions   Flagyl [metronidazole] Diarrhea   Limonene Itching   Yeast infection in mouth   Sulfa Antibiotics Other (See Comments)   Yeast infection in mouth   Doxycycline    Mouth soreness - reaction vs thrush?   Ibuprofen    Kidney Disease   Amoxicillin Other (See Comments)   REACTION: Oral yeast infection Has patient had a PCN  reaction causing immediate rash, facial/tongue/throat swelling, SOB or lightheadedness with hypotension: No Has patient had a PCN reaction causing severe rash involving mucus membranes or skin necrosis: No Has patient had a PCN reaction that required hospitalization No Has patient had a PCN reaction occurring within the last 10 years: No If all of the above answers are "NO", then may proceed with Cephalosporin use.   Chlorzoxazone Other (See Comments)   headache   Codeine Other (See Comments)   headache   Darvocet [propoxyphene N-acetaminophen] Itching   Dilaudid [hydromorphone Hcl] Itching   Hydralazine Other (See Comments)   Makes her feel bad   Keflex [cephalexin] Other (See Comments)   Pt does not recall reaction (maybe yeast infection)   Morphine And Codeine Itching   Nitrofurantoin Monohyd Macro Hives   Reaction to [employer] [oxycodone-acetaminophen] Itching   Patient can tolerate Acetaminophen solely   Trazodone And Nefazodone Palpitations        Medication List     STOP taking these medications    HYDROcodone-acetaminophen 10-325 MG tablet Commonly known as: Norco       TAKE these medications    acetaminophen 500 MG tablet Commonly known as: TYLENOL Take 1,000 mg by mouth daily as needed for moderate pain or headache.   albuterol 108 (90 Base) MCG/ACT inhaler Commonly known as: VENTOLIN HFA INHALE 2 PUFFS INTO THE LUNGS EVERY 6 HOURS AS NEEDED FOR WHEEZING OR SHORTNESS OF BREATH.   amLODipine-benazepril 10-40 MG capsule Commonly known as: LOTREL TAKE 1 CAPSULE BY MOUTH DAILY.   atorvastatin 20 MG tablet Commonly known as: LIPITOR TAKE 1 TABLET BY MOUTH DAILY.   clonazePAM 1 MG tablet Commonly known as: KLONOPIN Take 1 tablet (1 mg total) by mouth at bedtime.   docusate sodium 100 MG capsule Commonly known as: COLACE Take 200 mg by mouth at bedtime.   fentaNYL 25 MCG/HR Commonly known as: DURAGESIC Place 1 patch onto the skin every 3  (three) days.   gabapentin 400 MG capsule Commonly known as: NEURONTIN TAKE 1 CAPSULE BY MOUTH 3 TIMES DAILY.   hydrochlorothiazide 12.5 MG tablet Commonly known as: HYDRODIURIL TAKE 1 TABLET (12.5 MG TOTAL) BY MOUTH DAILY. What changed:  when to take this reasons to take this   levothyroxine 88 MCG tablet Commonly known as: SYNTHROID TAKE 1 TABLET BY MOUTH DAILY.   Narcan 4 MG/0.1ML Liqd nasal spray kit Generic drug: naloxone Place 1 spray into the nose as needed (accidental overdose).   Nebivolol HCl 20 MG Tabs TAKE 1 TABLET BY MOUTH DAILY.  nicotine 21 mg/24hr patch Commonly known as: NICODERM CQ - dosed in mg/24 hours Place 1 patch (21 mg total) onto the skin daily.   omeprazole 40 MG capsule Commonly known as: PRILOSEC TAKE 1 CAPSULE BY MOUTH 2 TIMES DAILY BEFORE A MEAL.   oxyCODONE 5 MG immediate release tablet Commonly known as: Oxy IR/ROXICODONE Take 1-2 tablets (5-10 mg total) by mouth every 4 (four) hours as needed for moderate pain (pain score 4-6).   QUEtiapine 200 MG tablet Commonly known as: SEROQUEL TAKE 1 TABLET (200 MG TOTAL) BY MOUTH AT BEDTIME.   rOPINIRole 1 MG tablet Commonly known as: REQUIP Take 1-2 tablets (1-2 mg total) by mouth 2 (two) times daily as needed (restless leg).   solifenacin 5 MG tablet Commonly known as: VESICARE Take 10 mg by mouth daily.   Spiriva HandiHaler 18 MCG inhalation capsule Generic drug: tiotropium PLACE 1 CAPSULE INTO INHALER AND INHALE DAILY.   tinidazole 500 MG tablet Commonly known as: TINDAMAX Take 2 tablets (1000 mg) by mouth daily for 5 days.   tiZANidine 4 MG tablet Commonly known as: ZANAFLEX Take 1 tablet (4 mg total) by mouth every 6 (six) hours as needed. What changed: when to take this   valACYclovir 500 MG tablet Commonly known as: VALTREX TAKE 1 TABLET BY MOUTH DAILY.   VEGETABLE LAXATIVE PO Take 1 tablet by mouth daily.   venlafaxine XR 150 MG 24 hr capsule Commonly known as:  EFFEXOR-XR TAKE 2 CAPSULES BY MOUTH DAILY.               Durable Medical Equipment  (From admission, onward)           Start     Ordered   02/13/23 1021  For home use only DME Bedside commode  Once       Comments: Confined to one room  Question:  Patient needs a bedside commode to treat with the following condition  Answer:  S/P total knee arthroplasty   02/13/23 1024   02/11/23 1855  DME 3 n 1  Once        02/11/23 1855   02/11/23 1855  DME Walker rolling  Once       Question Answer Comment  Walker: With 5 Inch Wheels   Patient needs a walker to treat with the following condition Status post total left knee replacement      02/11/23 1855            Diagnostic Studies: DG Knee Left Port  Result Date: 02/11/2023 CLINICAL DATA:  Status post total left knee arthroplasty. EXAM: PORTABLE LEFT KNEE - 1-2 VIEW COMPARISON:  Left knee radiograph 04/04/2021 FINDINGS: Interval total left knee arthroplasty. No perihardware lucency is seen to indicate hardware failure or loosening. Expected postoperative intra-articular and subcutaneous air. Anterior surgical skin staples. No acute fracture or dislocation. IMPRESSION: Interval total left knee arthroplasty without evidence of hardware failure. Electronically Signed   By: Neita Garnet M.D.   On: 02/11/2023 18:01    Disposition: Discharge disposition: 01-Home or Self Care          Follow-up Information     Kathryne Hitch, MD Follow up in 2 week(s).   Specialty: Orthopedic Surgery Contact information: [address] [place] [address] [phone]                  Signed: Kathryne Hitch 02/13/2023, 10:28 AM

## 2023-02-14 ENCOUNTER — Emergency Department (HOSPITAL_COMMUNITY)
Admission: EM | Admit: 2023-02-14 | Discharge: 2023-02-14 | Disposition: A | Discharge status: Home / Self Care | Payer: 59 | Attending: Emergency Medicine | Admitting: Emergency Medicine

## 2023-02-14 ENCOUNTER — Telehealth: Payer: Self-pay

## 2023-02-14 ENCOUNTER — Encounter (HOSPITAL_COMMUNITY): Payer: Self-pay

## 2023-02-14 ENCOUNTER — Other Ambulatory Visit: Payer: Self-pay | Admitting: Orthopaedic Surgery

## 2023-02-14 ENCOUNTER — Telehealth: Payer: Self-pay | Admitting: *Deleted

## 2023-02-14 ENCOUNTER — Emergency Department (HOSPITAL_COMMUNITY): Payer: 59

## 2023-02-14 ENCOUNTER — Other Ambulatory Visit: Payer: Self-pay

## 2023-02-14 DIAGNOSIS — J449 Chronic obstructive pulmonary disease, unspecified: Secondary | ICD-10-CM | POA: Insufficient documentation

## 2023-02-14 DIAGNOSIS — R0902 Hypoxemia: Secondary | ICD-10-CM | POA: Insufficient documentation

## 2023-02-14 DIAGNOSIS — Z79890 Hormone replacement therapy (postmenopausal): Secondary | ICD-10-CM | POA: Insufficient documentation

## 2023-02-14 DIAGNOSIS — R531 Weakness: Secondary | ICD-10-CM | POA: Diagnosis present

## 2023-02-14 DIAGNOSIS — I517 Cardiomegaly: Secondary | ICD-10-CM | POA: Diagnosis not present

## 2023-02-14 DIAGNOSIS — I129 Hypertensive chronic kidney disease with stage 1 through stage 4 chronic kidney disease, or unspecified chronic kidney disease: Secondary | ICD-10-CM | POA: Diagnosis not present

## 2023-02-14 DIAGNOSIS — Z79899 Other long term (current) drug therapy: Secondary | ICD-10-CM | POA: Insufficient documentation

## 2023-02-14 DIAGNOSIS — I959 Hypotension, unspecified: Secondary | ICD-10-CM | POA: Diagnosis not present

## 2023-02-14 DIAGNOSIS — R0989 Other specified symptoms and signs involving the circulatory and respiratory systems: Secondary | ICD-10-CM | POA: Insufficient documentation

## 2023-02-14 DIAGNOSIS — R300 Dysuria: Secondary | ICD-10-CM | POA: Diagnosis not present

## 2023-02-14 DIAGNOSIS — I10 Essential (primary) hypertension: Secondary | ICD-10-CM | POA: Diagnosis not present

## 2023-02-14 DIAGNOSIS — R5082 Postprocedural fever: Secondary | ICD-10-CM | POA: Insufficient documentation

## 2023-02-14 DIAGNOSIS — N183 Chronic kidney disease, stage 3 unspecified: Secondary | ICD-10-CM | POA: Diagnosis not present

## 2023-02-14 DIAGNOSIS — Z96652 Presence of left artificial knee joint: Secondary | ICD-10-CM | POA: Diagnosis not present

## 2023-02-14 DIAGNOSIS — Z79891 Long term (current) use of opiate analgesic: Secondary | ICD-10-CM | POA: Diagnosis not present

## 2023-02-14 DIAGNOSIS — Z471 Aftercare following joint replacement surgery: Secondary | ICD-10-CM | POA: Diagnosis not present

## 2023-02-14 DIAGNOSIS — K219 Gastro-esophageal reflux disease without esophagitis: Secondary | ICD-10-CM | POA: Diagnosis not present

## 2023-02-14 DIAGNOSIS — M549 Dorsalgia, unspecified: Secondary | ICD-10-CM | POA: Insufficient documentation

## 2023-02-14 DIAGNOSIS — J9610 Chronic respiratory failure, unspecified whether with hypoxia or hypercapnia: Secondary | ICD-10-CM | POA: Diagnosis not present

## 2023-02-14 DIAGNOSIS — G8929 Other chronic pain: Secondary | ICD-10-CM | POA: Diagnosis not present

## 2023-02-14 DIAGNOSIS — R001 Bradycardia, unspecified: Secondary | ICD-10-CM | POA: Diagnosis not present

## 2023-02-14 DIAGNOSIS — N189 Chronic kidney disease, unspecified: Secondary | ICD-10-CM | POA: Insufficient documentation

## 2023-02-14 DIAGNOSIS — R509 Fever, unspecified: Secondary | ICD-10-CM | POA: Diagnosis not present

## 2023-02-14 DIAGNOSIS — E1122 Type 2 diabetes mellitus with diabetic chronic kidney disease: Secondary | ICD-10-CM | POA: Diagnosis not present

## 2023-02-14 DIAGNOSIS — R0602 Shortness of breath: Secondary | ICD-10-CM | POA: Diagnosis not present

## 2023-02-14 DIAGNOSIS — J811 Chronic pulmonary edema: Secondary | ICD-10-CM | POA: Diagnosis not present

## 2023-02-14 LAB — CBC WITH DIFFERENTIAL/PLATELET
Abs Immature Granulocytes: 0.05 10*3/uL (ref 0.00–0.07)
Basophils Absolute: 0 10*3/uL (ref 0.0–0.1)
Basophils Relative: 0 %
Eosinophils Absolute: 0.2 10*3/uL (ref 0.0–0.5)
Eosinophils Relative: 2 %
HCT: 33.9 % — ABNORMAL LOW (ref 36.0–46.0)
Hemoglobin: 10.8 g/dL — ABNORMAL LOW (ref 12.0–15.0)
Immature Granulocytes: 1 %
Lymphocytes Relative: 17 %
Lymphs Abs: 1.8 10*3/uL (ref 0.7–4.0)
MCH: 31.3 pg (ref 26.0–34.0)
MCHC: 31.9 g/dL (ref 30.0–36.0)
MCV: 98.3 fL (ref 80.0–100.0)
Monocytes Absolute: 0.7 10*3/uL (ref 0.1–1.0)
Monocytes Relative: 7 %
Neutro Abs: 7.7 10*3/uL (ref 1.7–7.7)
Neutrophils Relative %: 73 %
Platelets: 166 10*3/uL (ref 150–400)
RBC: 3.45 MIL/uL — ABNORMAL LOW (ref 3.87–5.11)
RDW: 13.1 % (ref 11.5–15.5)
WBC: 10.4 10*3/uL (ref 4.0–10.5)
nRBC: 0 % (ref 0.0–0.2)

## 2023-02-14 LAB — COMPREHENSIVE METABOLIC PANEL
ALT: 14 U/L (ref 0–44)
AST: 20 U/L (ref 15–41)
Albumin: 2.5 g/dL — ABNORMAL LOW (ref 3.5–5.0)
Alkaline Phosphatase: 91 U/L (ref 38–126)
Anion gap: 8 (ref 5–15)
BUN: 19 mg/dL (ref 8–23)
CO2: 25 mmol/L (ref 22–32)
Calcium: 7.8 mg/dL — ABNORMAL LOW (ref 8.9–10.3)
Chloride: 104 mmol/L (ref 98–111)
Creatinine, Ser: 1.05 mg/dL — ABNORMAL HIGH (ref 0.44–1.00)
GFR, Estimated: 58 mL/min — ABNORMAL LOW (ref >60)
Glucose, Bld: 99 mg/dL (ref 70–99)
Potassium: 3.7 mmol/L (ref 3.5–5.1)
Sodium: 137 mmol/L (ref 135–145)
Total Bilirubin: 0.7 mg/dL (ref 0.3–1.2)
Total Protein: 5.4 g/dL — ABNORMAL LOW (ref 6.5–8.1)

## 2023-02-14 LAB — URINALYSIS, W/ REFLEX TO CULTURE (INFECTION SUSPECTED)
Bacteria, UA: NONE SEEN
Bilirubin Urine: NEGATIVE
Glucose, UA: NEGATIVE mg/dL
Hgb urine dipstick: NEGATIVE
Ketones, ur: NEGATIVE mg/dL
Leukocytes,Ua: NEGATIVE
Nitrite: NEGATIVE
Protein, ur: NEGATIVE mg/dL
Specific Gravity, Urine: 1.023 (ref 1.005–1.030)
pH: 5 (ref 5.0–8.0)

## 2023-02-14 LAB — I-STAT VENOUS BLOOD GAS, ED
Acid-Base Excess: 2 mmol/L (ref 0.0–2.0)
Bicarbonate: 27.6 mmol/L (ref 20.0–28.0)
Calcium, Ion: 1.03 mmol/L — ABNORMAL LOW (ref 1.15–1.40)
HCT: 32 % — ABNORMAL LOW (ref 36.0–46.0)
Hemoglobin: 10.9 g/dL — ABNORMAL LOW (ref 12.0–15.0)
O2 Saturation: 58 %
Potassium: 3.6 mmol/L (ref 3.5–5.1)
Sodium: 139 mmol/L (ref 135–145)
TCO2: 29 mmol/L (ref 22–32)
pCO2, Ven: 44.5 mmHg (ref 44–60)
pH, Ven: 7.401 (ref 7.25–7.43)
pO2, Ven: 30 mmHg — CL (ref 32–45)

## 2023-02-14 LAB — BRAIN NATRIURETIC PEPTIDE: B Natriuretic Peptide: 404.9 pg/mL — ABNORMAL HIGH (ref 0.0–100.0)

## 2023-02-14 LAB — LACTIC ACID, PLASMA: Lactic Acid, Venous: 1.2 mmol/L (ref 0.5–1.9)

## 2023-02-14 LAB — PROTIME-INR
INR: 1.1 (ref 0.8–1.2)
Prothrombin Time: 14.3 seconds (ref 11.4–15.2)

## 2023-02-14 LAB — ETHANOL: Alcohol, Ethyl (B): <10 mg/dL (ref <10)

## 2023-02-14 LAB — CBG MONITORING, ED: Glucose-Capillary: 92 mg/dL (ref 70–99)

## 2023-02-14 MED ORDER — HYDROCODONE-ACETAMINOPHEN 5-325 MG PO TABS
1.0000 | ORAL_TABLET | Freq: Once | ORAL | Status: AC
  Administered 2023-02-14: 1 via ORAL
  Filled 2023-02-14: qty 1

## 2023-02-14 MED ORDER — HYDROCODONE-ACETAMINOPHEN 5-325 MG PO TABS: 1.0000 | ORAL_TABLET | ORAL | 0 refills | Status: DC | PRN

## 2023-02-14 MED ORDER — FENTANYL 25 MCG/HR TD PT72
1.0000 | MEDICATED_PATCH | Freq: Once | TRANSDERMAL | Status: DC
  Administered 2023-02-14: 1 via TRANSDERMAL
  Filled 2023-02-14: qty 1

## 2023-02-14 MED ORDER — SODIUM CHLORIDE 0.9 % IV BOLUS
1000.0000 mL | Freq: Once | INTRAVENOUS | Status: AC
  Administered 2023-02-14: 1000 mL via INTRAVENOUS

## 2023-02-14 NOTE — Discharge Instructions (Addendum)
Seen in the emergency department for weakness and fever, urinary discomfort in the setting of her recent surgery.  You had lab work chest x-ray urinalysis that did not show an obvious cause of your fever.  Please continue to monitor at home.  Use your incentive spirometer every hour while awake.  Follow-up with your treatment team and return to the emergency department if any worsening or concerning symptoms.

## 2023-02-14 NOTE — ED Notes (Signed)
Pt informed staff to take off fentanyl patch since it was supposed to be changed this AM.

## 2023-02-14 NOTE — ED Triage Notes (Signed)
Pt BIBGEMS from home for weakness and lethargy. Room air was 78%, pt at 6LPM oxygen at 94%. Knee replacement on Tuesday, fever yesterday 101.5 with "burning when I urinate and dark urine."   88/50 to 98/54 with 500 NS  CBG 158 HR 58 RR 18

## 2023-02-14 NOTE — Telephone Encounter (Signed)
Call to patient and discussed how she is doing today. She sounds very tired, sleepy, medicated over the phone. She states her Fentanyl patch has run out and doesn't have anymore. I explained that is not something we typically prescribe, but I would relay message. Also she said Oxycodone is "not doing much for me-I'd prefer Hydrocodone". I explained that this would be a step down in pain medication and she was insistent this works better for her. I told her I'd pass along this message. She has a pain contract and controlled medication caution flag on chart as well.

## 2023-02-14 NOTE — ED Provider Notes (Signed)
[place] EMERGENCY DEPARTMENT AT [facility] Provider Note   CSN: [id number] Arrival date & time: 02/14/23  1836     History {Add pertinent medical, surgical, social history, OB history to HPI:1} Chief Complaint  Patient presents with   Fatigue    Jill Phillips is a 67 y.o. female.  She has a history of COPD CKD chronic pain.  She just underwent a total knee replacements by Dr. Magnus Ivan 3 days ago.  She said since she has been home she has had fever up to 101, lethargy, and burning with urination, dark urine.  She has a chronic cough she does not feel it is worse.  EMS found her to be hypoxic with sats in the high 70s and placed her on oxygen.  She denies any chest pain shortness of breath abdominal pain vomiting diarrhea.  The history is provided by the patient and the EMS personnel.  Fever Max temp prior to arrival:  101 Severity:  Moderate Onset quality:  Gradual Duration:  2 days Timing:  Intermittent Chronicity:  New Relieved by:  None tried Worsened by:  Nothing Ineffective treatments:  None tried Associated symptoms: cough, dysuria and somnolence   Associated symptoms: no chest pain, no diarrhea, no headaches, no nausea, no sore throat and no vomiting        Home Medications Prior to Admission medications   Medication Sig Start Date End Date Taking? Authorizing Provider  HYDROcodone-acetaminophen (NORCO/VICODIN) 5-325 MG tablet Take 1-2 tablets by mouth every 4 (four) hours as needed for moderate pain. 02/14/23   Kathryne Hitch, MD  acetaminophen (TYLENOL) 500 MG tablet Take 1,000 mg by mouth daily as needed for moderate pain or headache.    [provider]  albuterol (VENTOLIN HFA) 108 (90 Base) MCG/ACT inhaler INHALE 2 PUFFS INTO THE LUNGS EVERY 6 HOURS AS NEEDED FOR WHEEZING OR SHORTNESS OF BREATH. 11/19/21   Burns, Bobette Mo, MD  amLODipine-benazepril (LOTREL) 10-40 MG capsule TAKE 1 CAPSULE BY MOUTH DAILY. 12/27/22   Pincus Sanes, MD   atorvastatin (LIPITOR) 20 MG tablet TAKE 1 TABLET BY MOUTH DAILY. 08/30/22   Pincus Sanes, MD  clonazePAM (KLONOPIN) 1 MG tablet Take 1 tablet (1 mg total) by mouth at bedtime. 11/04/22   Pincus Sanes, MD  docusate sodium (COLACE) 100 MG capsule Take 200 mg by mouth at bedtime.    [provider]  fentaNYL (DURAGESIC) 25 MCG/HR Place 1 patch onto the skin every 3 (three) days. 07/15/22   [provider]  gabapentin (NEURONTIN) 400 MG capsule TAKE 1 CAPSULE BY MOUTH 3 TIMES DAILY. 12/31/22   Pincus Sanes, MD  hydrochlorothiazide (HYDRODIURIL) 12.5 MG tablet TAKE 1 TABLET (12.5 MG TOTAL) BY MOUTH DAILY. Patient taking differently: Take 12.5 mg by mouth daily as needed (headache). 06/21/22   Pincus Sanes, MD  levothyroxine (SYNTHROID) 88 MCG tablet TAKE 1 TABLET BY MOUTH DAILY. 09/18/22   Pincus Sanes, MD  NARCAN 4 MG/0.1ML LIQD nasal spray kit Place 1 spray into the nose as needed (accidental overdose). 03/15/20   Kerrin Champagne, MD  Nebivolol HCl 20 MG TABS TAKE 1 TABLET BY MOUTH DAILY. 01/07/23   Burns, Bobette Mo, MD  nicotine (NICODERM CQ - DOSED IN MG/24 HOURS) 21 mg/24hr patch Place 1 patch (21 mg total) onto the skin daily. Patient not taking: Reported on 01/31/2023 07/02/22   Pincus Sanes, MD  omeprazole (PRILOSEC) 40 MG capsule TAKE 1 CAPSULE BY MOUTH 2  TIMES DAILY BEFORE A MEAL. 08/13/22   Pincus Sanes, MD  oxyCODONE (OXY IR/ROXICODONE) 5 MG immediate release tablet Take 1-2 tablets (5-10 mg total) by mouth every 4 (four) hours as needed for moderate pain (pain score 4-6). 02/13/23   Kathryne Hitch, MD  Psyllium (VEGETABLE LAXATIVE PO) Take 1 tablet by mouth daily.    [provider]  QUEtiapine (SEROQUEL) 200 MG tablet TAKE 1 TABLET (200 MG TOTAL) BY MOUTH AT BEDTIME. 01/24/23   Burns, Bobette Mo, MD  rOPINIRole (REQUIP) 1 MG tablet Take 1-2 tablets (1-2 mg total) by mouth 2 (two) times daily as needed (restless leg). 02/13/23   Kathryne Hitch, MD   solifenacin (VESICARE) 5 MG tablet Take 10 mg by mouth daily.    [provider]  SPIRIVA HANDIHALER 18 MCG inhalation capsule PLACE 1 CAPSULE INTO INHALER AND INHALE DAILY. 04/26/22   Pincus Sanes, MD  tinidazole (TINDAMAX) 500 MG tablet Take 2 tablets (1000 mg) by mouth daily for 5 days. Patient not taking: Reported on 01/31/2023 10/14/22   Patton Salles, MD  tiZANidine (ZANAFLEX) 4 MG tablet Take 1 tablet (4 mg total) by mouth every 6 (six) hours as needed. 02/13/23   Kathryne Hitch, MD  valACYclovir (VALTREX) 500 MG tablet TAKE 1 TABLET BY MOUTH DAILY. 02/05/23   Pincus Sanes, MD  venlafaxine XR (EFFEXOR-XR) 150 MG 24 hr capsule TAKE 2 CAPSULES BY MOUTH DAILY. 08/30/22   Pincus Sanes, MD      Allergies    Flagyl [metronidazole], Limonene, Sulfa antibiotics, Doxycycline, Ibuprofen, Amoxicillin, Chlorzoxazone, Codeine, Darvocet [propoxyphene n-acetaminophen], Dilaudid [hydromorphone hcl], Hydralazine, Keflex [cephalexin], Morphine and codeine, Nitrofurantoin monohyd macro, Percocet [oxycodone-acetaminophen], and Trazodone and nefazodone    Review of Systems   Review of Systems  Constitutional:  Positive for fever.  HENT:  Negative for sore throat.   Eyes:  Negative for visual disturbance.  Respiratory:  Positive for cough.   Cardiovascular:  Negative for chest pain.  Gastrointestinal:  Negative for abdominal pain, diarrhea, nausea and vomiting.  Genitourinary:  Positive for dysuria.  Skin:  Positive for wound.  Neurological:  Negative for headaches.    Physical Exam Updated Vital Signs There were no vitals taken for this visit. Physical Exam Vitals and nursing note reviewed.  Constitutional:      General: She is not in acute distress.    Appearance: Normal appearance. She is well-developed.  HENT:     Head: Normocephalic and atraumatic.  Eyes:     Conjunctiva/sclera: Conjunctivae normal.  Cardiovascular:     Rate and Rhythm: Normal rate and  regular rhythm.     Heart sounds: No murmur heard. Pulmonary:     Effort: Pulmonary effort is normal. No respiratory distress.     Breath sounds: Normal breath sounds.  Abdominal:     Palpations: Abdomen is soft.     Tenderness: There is no abdominal tenderness. There is no guarding or rebound.  Musculoskeletal:        General: Tenderness present.     Cervical back: Neck supple.     Comments: She has some left knee swelling and a dressing in place.  No significant erythema.  Distal pulses motor sensation intact.  Skin:    General: Skin is warm and dry.     Capillary Refill: Capillary refill takes less than 2 seconds.  Neurological:     General: No focal deficit present.     Mental Status: She is alert.  Sensory: No sensory deficit.     Motor: No weakness.     ED Results / Procedures / Treatments   Labs (all labs ordered are listed, but only abnormal results are displayed) Labs Reviewed  CULTURE, BLOOD (ROUTINE X 2)  CULTURE, BLOOD (ROUTINE X 2)  COMPREHENSIVE METABOLIC PANEL  LACTIC ACID, PLASMA  LACTIC ACID, PLASMA  CBC WITH DIFFERENTIAL/PLATELET  PROTIME-INR  URINALYSIS, W/ REFLEX TO CULTURE (INFECTION SUSPECTED)  ETHANOL    EKG None  Radiology No results found.  Procedures Procedures  {Document cardiac monitor, telemetry assessment procedure when appropriate:1}  Medications Ordered in ED Medications  sodium chloride 0.9 % bolus 1,000 mL (has no administration in time range)    ED Course/ Medical Decision Making/ A&P   {   Click here for ABCD2, HEART and other calculatorsREFRESH Note before signing :1}                          Medical Decision Making Amount and/or Complexity of Data Reviewed Labs: ordered. Radiology: ordered.   This patient complains of ***; this involves an extensive number of treatment Options and is a complaint that carries with it a high risk of complications and morbidity. The differential includes ***  I ordered, reviewed  and interpreted labs, which included *** I ordered medication *** and reviewed PMP when indicated. I ordered imaging studies which included *** and I independently    visualized and interpreted imaging which showed *** Additional history obtained from *** Previous records obtained and reviewed *** I consulted *** and discussed lab and imaging findings and discussed disposition.  Cardiac monitoring reviewed, *** Social determinants considered, *** Critical Interventions: ***  After the interventions stated above, I reevaluated the patient and found *** Admission and further testing considered, ***   {Document critical care time when appropriate:1} {Document review of labs and clinical decision tools ie heart score, Chads2Vasc2 etc:1}  {Document your independent review of radiology images, and any outside records:1} {Document your discussion with family members, caretakers, and with consultants:1} {Document social determinants of health affecting pt's care:1} {Document your decision making why or why not admission, treatments were needed:1} Final Clinical Impression(s) / ED Diagnoses Final diagnoses:  None    Rx / DC Orders ED Discharge Orders     None

## 2023-02-14 NOTE — Transitions of Care (Post Inpatient/ED Visit) (Signed)
02/14/2023  Name: Jill Phillips MRN: [id number] DOB: Aug 30, 1956  Today's TOC FU Call Status: Today's TOC FU Call Status:: Successful TOC FU Call Competed TOC FU Call Complete Date: 02/14/23 (Call completed with daughter-Lisa)  Transition Care Management Follow-up Telephone Call Date of Discharge: 02/13/23 Discharge Facility: [facility]) Type of Discharge: Inpatient Admission Primary Inpatient Discharge Diagnosis:: "unilateral primary osteoarthritis, left knee" How have you been since you were released from the hospital?: Same (Dtr states pt continues to have pain-taking Oxycodone about q4hrs as ordered. She started running a fever yest when they got home. Last temp check was 101.5 around 10am-given Tylenol. Dtr rechecked during call and down to 100.3.) Any questions or concerns?: Yes Patient Questions/Concerns:: Dtr wanting to know what else to do besides give Tylenol for fever Patient Questions/Concerns Addressed: Other: (Dtr is going to call [place] office ASAP to advise of issues and get recommendations)  Items Reviewed: Did you receive and understand the discharge instructions provided?: Yes Medications obtained,verified, and reconciled?: Partial Review Completed Reason for Partial Mediation Review: dftr unable to complete full med review at this time-had to attend to pt Any new allergies since your discharge?: No Dietary orders reviewed?: Yes Type of Diet Ordered:: low salt/heart healthy Do you have support at home?: Yes People in Home: child(ren), adult Name of Support/Comfort Primary Source: daughters  Medications Reviewed Today: Medications Reviewed Today     Reviewed by Charlyn Minerva, RN (Registered Nurse) on 02/14/23 at 1334  Med List Status: <None>   Medication Order Taking? Sig Documenting Provider Last Dose Status Informant  acetaminophen (TYLENOL) 500 MG tablet [id number] Yes Take 1,000 mg by mouth daily as needed for moderate pain or headache.  [provider] Taking Active Self  albuterol (VENTOLIN HFA) 108 (90 Base) MCG/ACT inhaler [id number]  INHALE 2 PUFFS INTO THE LUNGS EVERY 6 HOURS AS NEEDED FOR WHEEZING OR SHORTNESS OF BREATH. Pincus Sanes, MD  Active Self  amLODipine-benazepril (LOTREL) 10-40 MG capsule [id number]  TAKE 1 CAPSULE BY MOUTH DAILY. Pincus Sanes, MD  Active Self  atorvastatin (LIPITOR) 20 MG tablet [id number]  TAKE 1 TABLET BY MOUTH DAILY. Pincus Sanes, MD  Active Self  clonazePAM Scarlette Calico) 1 MG tablet [id number]  Take 1 tablet (1 mg total) by mouth at bedtime. Pincus Sanes, MD  Active Self  docusate sodium (COLACE) 100 MG capsule [id number]  Take 200 mg by mouth at bedtime. [provider]  Active Self  fentaNYL (DURAGESIC) 25 MCG/HR [id number] Yes Place 1 patch onto the skin every 3 (three) days. [provider] Taking Active Self  gabapentin (NEURONTIN) 400 MG capsule [id number]  TAKE 1 CAPSULE BY MOUTH 3 TIMES DAILY. Pincus Sanes, MD  Active Self  hydrochlorothiazide (HYDRODIURIL) 12.5 MG tablet [id number]  TAKE 1 TABLET (12.5 MG TOTAL) BY MOUTH DAILY.  Patient taking differently: Take 12.5 mg by mouth daily as needed (headache).   Pincus Sanes, MD  Active Self  levothyroxine (SYNTHROID) 88 MCG tablet [id number]  TAKE 1 TABLET BY MOUTH DAILY. Pincus Sanes, MD  Active Self  NARCAN 4 MG/0.1ML LIQD nasal spray kit [id number]  Place 1 spray into the nose as needed (accidental overdose). Kerrin Champagne, MD  Active Self  Nebivolol HCl 20 MG TABS [id number]  TAKE 1 TABLET BY MOUTH DAILY. Pincus Sanes, MD  Active Self  nicotine (NICODERM CQ - DOSED IN MG/24 HOURS) 21 mg/24hr patch [id number]  Place 1 patch (21 mg total) onto the skin  daily.  Patient not taking: Reported on 01/31/2023   Pincus Sanes, MD  Active Self  omeprazole (PRILOSEC) 40 MG capsule [id number]  TAKE 1 CAPSULE BY MOUTH 2 TIMES DAILY BEFORE A MEAL. Pincus Sanes, MD  Active Self  oxyCODONE (OXY IR/ROXICODONE) 5 MG  immediate release tablet [id number] Yes Take 1-2 tablets (5-10 mg total) by mouth every 4 (four) hours as needed for moderate pain (pain score 4-6). Kathryne Hitch, MD Taking Active   Psyllium (VEGETABLE LAXATIVE PO) [id number]  Take 1 tablet by mouth daily. [provider]  Active Self  QUEtiapine (SEROQUEL) 200 MG tablet [id number]  TAKE 1 TABLET (200 MG TOTAL) BY MOUTH AT BEDTIME. Pincus Sanes, MD  Active Self  rOPINIRole (REQUIP) 1 MG tablet [id number]  Take 1-2 tablets (1-2 mg total) by mouth 2 (two) times daily as needed (restless leg). Kathryne Hitch, MD  Active   solifenacin (VESICARE) 5 MG tablet [id number]  Take 10 mg by mouth daily. [provider]  Active Self  SPIRIVA HANDIHALER 18 MCG inhalation capsule [id number]  PLACE 1 CAPSULE INTO INHALER AND INHALE DAILY. Pincus Sanes, MD  Active Self  tinidazole [facility]) 500 MG tablet [id number]  Take 2 tablets (1000 mg) by mouth daily for 5 days.  Patient not taking: Reported on 01/31/2023   Patton Salles, MD  Active Self  tiZANidine (ZANAFLEX) 4 MG tablet [id number]  Take 1 tablet (4 mg total) by mouth every 6 (six) hours as needed. Kathryne Hitch, MD  Active   valACYclovir (VALTREX) 500 MG tablet [id number]  TAKE 1 TABLET BY MOUTH DAILY. Pincus Sanes, MD  Active   venlafaxine XR (EFFEXOR-XR) 150 MG 24 hr capsule [id number]  TAKE 2 CAPSULES BY MOUTH DAILY. Pincus Sanes, MD  Active Self  Med List Note Gorden Harms, CPhT 07/26/15 1253): Pain management with Dr. Faith Rogue            Home Care and Equipment/Supplies: Were Home Health Services Ordered?: Yes Name of Home Health Agency:: [phone] Has Agency set up a time to come to your home?: No (Dtr aware to follow up if she does not hear from agency within 48hrs post discharge) Any new equipment or medical supplies ordered?: Yes Name of Medical supply agency?: [facility] Were you able to get the equipment/medical supplies?:  Yes Do you have any questions related to the use of the equipment/supplies?: No  Functional Questionnaire: Do you need assistance with meal preparation?: Yes Do you need assistance with eating?: No Do you have difficulty maintaining continence: No Do you need assistance with getting out of bed/getting out of a chair/moving?: Yes Do you have difficulty managing or taking your medications?: No  Follow up appointments reviewed: PCP Follow-up appointment confirmed?: Yes Date of PCP follow-up appointment?: 02/18/23 Follow-up Provider: Dr. Lawerance Bach Specialist [facility] Follow-up appointment confirmed?: Yes Date of Specialist follow-up appointment?: 02/24/23 Follow-Up Specialty Provider:: Dr. Rayburn Ma Do you need transportation to your follow-up appointment?: No Do you understand care options if your condition(s) worsen?: Yes-patient verbalized understanding  SDOH Interventions Today    Flowsheet Row Most Recent Value  SDOH Interventions   Food Insecurity Interventions Intervention Not Indicated  Transportation Interventions Intervention Not Indicated      TOC Interventions Today    Flowsheet Row Most Recent Value  TOC Interventions   TOC Interventions Discussed/Reviewed TOC Interventions Discussed, Post discharge activity limitations per provider, S/S of infection, Post op wound/incision care  Interventions Today    Flowsheet Row Most Recent Value  General Interventions   General Interventions Discussed/Reviewed General Interventions Discussed, Doctor Visits  Doctor Visits Discussed/Reviewed Doctor Visits Discussed, Specialist, PCP  PCP/Specialist Visits Compliance with follow-up visit  Education Interventions   Education Provided Provided Education  Provided Verbal Education On Nutrition, Medication, When to see the doctor  Nutrition Interventions   Nutrition Discussed/Reviewed Nutrition Discussed  Pharmacy Interventions   Pharmacy Dicussed/Reviewed Pharmacy Topics  Discussed, Medications and their functions  Safety Interventions   Safety Discussed/Reviewed Safety Discussed, Home Safety       Alessandra Grout [facility] Care Management Care Management Community Coordinator Direct Phone: [phone] Toll Free: [phone] Fax: [phone]

## 2023-02-14 NOTE — ED Notes (Signed)
Patient and family requesting pain medications. MD notified. Pt is requesting medications to take until the 18th when she sees her provider.

## 2023-02-15 LAB — CULTURE, BLOOD (ROUTINE X 2)

## 2023-02-16 LAB — CULTURE, BLOOD (ROUTINE X 2)

## 2023-02-17 ENCOUNTER — Other Ambulatory Visit: Payer: Self-pay | Admitting: Orthopaedic Surgery

## 2023-02-17 ENCOUNTER — Telehealth: Payer: Self-pay | Admitting: *Deleted

## 2023-02-17 ENCOUNTER — Telehealth: Payer: Self-pay | Admitting: Orthopaedic Surgery

## 2023-02-17 LAB — CULTURE, BLOOD (ROUTINE X 2)

## 2023-02-17 NOTE — Telephone Encounter (Signed)
Ok thank you 

## 2023-02-17 NOTE — Telephone Encounter (Signed)
Call to patient to check status after going to ED on Friday afternoon. She states that she is not sure why she went, but therapy suggested she go. She feels she is doing ok now, but really not having any relief with pain medication. She was unsure that MD had called in a different pain medication for her Friday, but just got the Norco today. ER did change her Fentanyl patch while there. Reminded to be very careful with narcotics and to use ice, elevation, compression for pain and swelling. No other needs at this time.

## 2023-02-17 NOTE — Telephone Encounter (Signed)
Patient requesting different pain medication Oxycodone is not working well she is requesting Hydrocodone please advise

## 2023-02-17 NOTE — Telephone Encounter (Signed)
Have you called her?

## 2023-02-18 ENCOUNTER — Inpatient Hospital Stay: Payer: 59 | Admitting: Internal Medicine

## 2023-02-18 ENCOUNTER — Other Ambulatory Visit: Payer: Self-pay | Admitting: Internal Medicine

## 2023-02-18 ENCOUNTER — Telehealth: Payer: Self-pay | Admitting: Orthopaedic Surgery

## 2023-02-18 DIAGNOSIS — J439 Emphysema, unspecified: Secondary | ICD-10-CM | POA: Diagnosis not present

## 2023-02-18 DIAGNOSIS — J9610 Chronic respiratory failure, unspecified whether with hypoxia or hypercapnia: Secondary | ICD-10-CM | POA: Diagnosis not present

## 2023-02-18 DIAGNOSIS — K219 Gastro-esophageal reflux disease without esophagitis: Secondary | ICD-10-CM | POA: Diagnosis not present

## 2023-02-18 DIAGNOSIS — Z09 Encounter for follow-up examination after completed treatment for conditions other than malignant neoplasm: Secondary | ICD-10-CM | POA: Diagnosis not present

## 2023-02-18 DIAGNOSIS — I129 Hypertensive chronic kidney disease with stage 1 through stage 4 chronic kidney disease, or unspecified chronic kidney disease: Secondary | ICD-10-CM | POA: Diagnosis not present

## 2023-02-18 DIAGNOSIS — Z79891 Long term (current) use of opiate analgesic: Secondary | ICD-10-CM | POA: Diagnosis not present

## 2023-02-18 DIAGNOSIS — J449 Chronic obstructive pulmonary disease, unspecified: Secondary | ICD-10-CM | POA: Diagnosis not present

## 2023-02-18 DIAGNOSIS — G2581 Restless legs syndrome: Secondary | ICD-10-CM | POA: Diagnosis not present

## 2023-02-18 DIAGNOSIS — E1122 Type 2 diabetes mellitus with diabetic chronic kidney disease: Secondary | ICD-10-CM | POA: Diagnosis not present

## 2023-02-18 DIAGNOSIS — Z471 Aftercare following joint replacement surgery: Secondary | ICD-10-CM | POA: Diagnosis not present

## 2023-02-18 DIAGNOSIS — Z96652 Presence of left artificial knee joint: Secondary | ICD-10-CM | POA: Diagnosis not present

## 2023-02-18 DIAGNOSIS — M545 Low back pain, unspecified: Secondary | ICD-10-CM | POA: Diagnosis not present

## 2023-02-18 DIAGNOSIS — N183 Chronic kidney disease, stage 3 unspecified: Secondary | ICD-10-CM | POA: Diagnosis not present

## 2023-02-18 LAB — CULTURE, BLOOD (ROUTINE X 2): Culture: NO GROWTH

## 2023-02-18 NOTE — Telephone Encounter (Signed)
Lvm for to cb to discuss

## 2023-02-18 NOTE — Telephone Encounter (Signed)
Jill Phillips (PT) from [facility] called requesting Dr. Magnus Ivan or Morrie Sheldon B called pt. Pt has had no bowel movement since day after surgery 6/13. Pt has been taking veggie supplement and 2 stool softener daily and still no bowl movement. Also pt pain is alittle better. Pain level 6 out of 10. Pt has not been drinking enough water and urine is discolored. Pt is taking pain meds 2 tabs 3x day. Please call [place] on secure line at [phone]

## 2023-02-19 DIAGNOSIS — Z79891 Long term (current) use of opiate analgesic: Secondary | ICD-10-CM | POA: Diagnosis not present

## 2023-02-19 DIAGNOSIS — N183 Chronic kidney disease, stage 3 unspecified: Secondary | ICD-10-CM | POA: Diagnosis not present

## 2023-02-19 DIAGNOSIS — J449 Chronic obstructive pulmonary disease, unspecified: Secondary | ICD-10-CM | POA: Diagnosis not present

## 2023-02-19 DIAGNOSIS — I129 Hypertensive chronic kidney disease with stage 1 through stage 4 chronic kidney disease, or unspecified chronic kidney disease: Secondary | ICD-10-CM | POA: Diagnosis not present

## 2023-02-19 DIAGNOSIS — Z471 Aftercare following joint replacement surgery: Secondary | ICD-10-CM | POA: Diagnosis not present

## 2023-02-19 DIAGNOSIS — J9610 Chronic respiratory failure, unspecified whether with hypoxia or hypercapnia: Secondary | ICD-10-CM | POA: Diagnosis not present

## 2023-02-19 DIAGNOSIS — Z96652 Presence of left artificial knee joint: Secondary | ICD-10-CM | POA: Diagnosis not present

## 2023-02-19 DIAGNOSIS — K219 Gastro-esophageal reflux disease without esophagitis: Secondary | ICD-10-CM | POA: Diagnosis not present

## 2023-02-19 DIAGNOSIS — E1122 Type 2 diabetes mellitus with diabetic chronic kidney disease: Secondary | ICD-10-CM | POA: Diagnosis not present

## 2023-02-19 LAB — CULTURE, BLOOD (ROUTINE X 2): Culture: NO GROWTH

## 2023-02-19 NOTE — Telephone Encounter (Signed)
Tried calling pt again. Mailbox is full. 

## 2023-02-20 ENCOUNTER — Telehealth: Payer: Self-pay | Admitting: Orthopaedic Surgery

## 2023-02-20 NOTE — Telephone Encounter (Signed)
Received call from [facility] (PT) with [facility] advised patient is struggling with doing her exercises because of the pain she is in. Charri said the pain medicine is not helping. Charri said patient went to pain management and said pain management was not really helping her. Charri said patient finally had a bowel movement yesterday after not going for a week. The number to contact Emiliano Dyer is [phone]

## 2023-02-21 DIAGNOSIS — Z96652 Presence of left artificial knee joint: Secondary | ICD-10-CM | POA: Diagnosis not present

## 2023-02-21 DIAGNOSIS — I129 Hypertensive chronic kidney disease with stage 1 through stage 4 chronic kidney disease, or unspecified chronic kidney disease: Secondary | ICD-10-CM | POA: Diagnosis not present

## 2023-02-21 DIAGNOSIS — J9610 Chronic respiratory failure, unspecified whether with hypoxia or hypercapnia: Secondary | ICD-10-CM | POA: Diagnosis not present

## 2023-02-21 DIAGNOSIS — N183 Chronic kidney disease, stage 3 unspecified: Secondary | ICD-10-CM | POA: Diagnosis not present

## 2023-02-21 DIAGNOSIS — Z471 Aftercare following joint replacement surgery: Secondary | ICD-10-CM | POA: Diagnosis not present

## 2023-02-21 DIAGNOSIS — J449 Chronic obstructive pulmonary disease, unspecified: Secondary | ICD-10-CM | POA: Diagnosis not present

## 2023-02-21 DIAGNOSIS — E1122 Type 2 diabetes mellitus with diabetic chronic kidney disease: Secondary | ICD-10-CM | POA: Diagnosis not present

## 2023-02-21 DIAGNOSIS — Z79891 Long term (current) use of opiate analgesic: Secondary | ICD-10-CM | POA: Diagnosis not present

## 2023-02-21 DIAGNOSIS — K219 Gastro-esophageal reflux disease without esophagitis: Secondary | ICD-10-CM | POA: Diagnosis not present

## 2023-02-24 ENCOUNTER — Other Ambulatory Visit: Payer: Self-pay | Admitting: *Deleted

## 2023-02-24 ENCOUNTER — Encounter: Payer: Self-pay | Admitting: Orthopaedic Surgery

## 2023-02-24 ENCOUNTER — Telehealth: Payer: Self-pay | Admitting: *Deleted

## 2023-02-24 ENCOUNTER — Ambulatory Visit (INDEPENDENT_AMBULATORY_CARE_PROVIDER_SITE_OTHER): Payer: 59 | Admitting: Orthopaedic Surgery

## 2023-02-24 DIAGNOSIS — R0602 Shortness of breath: Secondary | ICD-10-CM | POA: Diagnosis not present

## 2023-02-24 DIAGNOSIS — Z79899 Other long term (current) drug therapy: Secondary | ICD-10-CM | POA: Diagnosis not present

## 2023-02-24 DIAGNOSIS — J449 Chronic obstructive pulmonary disease, unspecified: Secondary | ICD-10-CM | POA: Diagnosis not present

## 2023-02-24 DIAGNOSIS — Z96652 Presence of left artificial knee joint: Secondary | ICD-10-CM

## 2023-02-24 NOTE — Telephone Encounter (Signed)
Ortho bundle 14 day in office visit completed. Will finish out already scheduled HHPT this week, then eval with Outpatient Rehab at [facility] scheduled for Monday, 03/03/23.

## 2023-02-24 NOTE — Progress Notes (Signed)
This is the first postoperative visit for the patient at 2 weeks status post a left total knee replacement to treat severe arthritis of her left knee.  On examination of her left knee, her extension is almost full but her flexion is to maybe 60 degrees or so.  Her mobility and motion has been limited by pain tolerance.  She is in chronic pain management.  Her calf is fortunately soft.  There is some pain and bruising around her Achilles tendon.  Her Achilles is intact.  Her incision looks good.  The staples are removed and Steri-Strips applied.  We will work on getting her set up for outpatient physical therapy for aggressive motion of her left knee.  Will see her back in 4 weeks for repeat exam but no x-rays are needed.  Even though she has kidney disease, I encouraged her to use Voltaren gel on the back of her ankle at the Achilles area because this is a low systemic absorption and should do well for her for inflammatory type of pain and can be safer for short amount of time.  She understands this as well.

## 2023-02-25 ENCOUNTER — Other Ambulatory Visit: Payer: Self-pay | Admitting: Internal Medicine

## 2023-02-25 DIAGNOSIS — J9610 Chronic respiratory failure, unspecified whether with hypoxia or hypercapnia: Secondary | ICD-10-CM | POA: Diagnosis not present

## 2023-02-25 DIAGNOSIS — Z471 Aftercare following joint replacement surgery: Secondary | ICD-10-CM | POA: Diagnosis not present

## 2023-02-25 DIAGNOSIS — E1122 Type 2 diabetes mellitus with diabetic chronic kidney disease: Secondary | ICD-10-CM | POA: Diagnosis not present

## 2023-02-25 DIAGNOSIS — J449 Chronic obstructive pulmonary disease, unspecified: Secondary | ICD-10-CM | POA: Diagnosis not present

## 2023-02-25 DIAGNOSIS — K219 Gastro-esophageal reflux disease without esophagitis: Secondary | ICD-10-CM | POA: Diagnosis not present

## 2023-02-25 DIAGNOSIS — Z96652 Presence of left artificial knee joint: Secondary | ICD-10-CM | POA: Diagnosis not present

## 2023-02-25 DIAGNOSIS — Z79891 Long term (current) use of opiate analgesic: Secondary | ICD-10-CM | POA: Diagnosis not present

## 2023-02-25 DIAGNOSIS — I129 Hypertensive chronic kidney disease with stage 1 through stage 4 chronic kidney disease, or unspecified chronic kidney disease: Secondary | ICD-10-CM | POA: Diagnosis not present

## 2023-02-25 DIAGNOSIS — N183 Chronic kidney disease, stage 3 unspecified: Secondary | ICD-10-CM | POA: Diagnosis not present

## 2023-02-26 DIAGNOSIS — N183 Chronic kidney disease, stage 3 unspecified: Secondary | ICD-10-CM | POA: Diagnosis not present

## 2023-02-26 DIAGNOSIS — J449 Chronic obstructive pulmonary disease, unspecified: Secondary | ICD-10-CM | POA: Diagnosis not present

## 2023-02-26 DIAGNOSIS — E1122 Type 2 diabetes mellitus with diabetic chronic kidney disease: Secondary | ICD-10-CM | POA: Diagnosis not present

## 2023-02-26 DIAGNOSIS — I129 Hypertensive chronic kidney disease with stage 1 through stage 4 chronic kidney disease, or unspecified chronic kidney disease: Secondary | ICD-10-CM | POA: Diagnosis not present

## 2023-02-26 DIAGNOSIS — Z79891 Long term (current) use of opiate analgesic: Secondary | ICD-10-CM | POA: Diagnosis not present

## 2023-02-26 DIAGNOSIS — J9610 Chronic respiratory failure, unspecified whether with hypoxia or hypercapnia: Secondary | ICD-10-CM | POA: Diagnosis not present

## 2023-02-26 DIAGNOSIS — Z471 Aftercare following joint replacement surgery: Secondary | ICD-10-CM | POA: Diagnosis not present

## 2023-02-26 DIAGNOSIS — K219 Gastro-esophageal reflux disease without esophagitis: Secondary | ICD-10-CM | POA: Diagnosis not present

## 2023-02-26 DIAGNOSIS — Z96652 Presence of left artificial knee joint: Secondary | ICD-10-CM | POA: Diagnosis not present

## 2023-02-27 ENCOUNTER — Other Ambulatory Visit: Payer: Self-pay | Admitting: Internal Medicine

## 2023-02-28 DIAGNOSIS — Z471 Aftercare following joint replacement surgery: Secondary | ICD-10-CM | POA: Diagnosis not present

## 2023-02-28 DIAGNOSIS — J449 Chronic obstructive pulmonary disease, unspecified: Secondary | ICD-10-CM | POA: Diagnosis not present

## 2023-02-28 DIAGNOSIS — K219 Gastro-esophageal reflux disease without esophagitis: Secondary | ICD-10-CM | POA: Diagnosis not present

## 2023-02-28 DIAGNOSIS — J9610 Chronic respiratory failure, unspecified whether with hypoxia or hypercapnia: Secondary | ICD-10-CM | POA: Diagnosis not present

## 2023-02-28 DIAGNOSIS — Z79891 Long term (current) use of opiate analgesic: Secondary | ICD-10-CM | POA: Diagnosis not present

## 2023-02-28 DIAGNOSIS — N183 Chronic kidney disease, stage 3 unspecified: Secondary | ICD-10-CM | POA: Diagnosis not present

## 2023-02-28 DIAGNOSIS — E1122 Type 2 diabetes mellitus with diabetic chronic kidney disease: Secondary | ICD-10-CM | POA: Diagnosis not present

## 2023-02-28 DIAGNOSIS — I129 Hypertensive chronic kidney disease with stage 1 through stage 4 chronic kidney disease, or unspecified chronic kidney disease: Secondary | ICD-10-CM | POA: Diagnosis not present

## 2023-02-28 DIAGNOSIS — Z96652 Presence of left artificial knee joint: Secondary | ICD-10-CM | POA: Diagnosis not present

## 2023-03-03 ENCOUNTER — Other Ambulatory Visit: Payer: Self-pay

## 2023-03-03 ENCOUNTER — Ambulatory Visit: Payer: 59 | Attending: Orthopaedic Surgery

## 2023-03-03 DIAGNOSIS — M25662 Stiffness of left knee, not elsewhere classified: Secondary | ICD-10-CM | POA: Diagnosis not present

## 2023-03-03 DIAGNOSIS — Z96652 Presence of left artificial knee joint: Secondary | ICD-10-CM | POA: Diagnosis not present

## 2023-03-03 DIAGNOSIS — R262 Difficulty in walking, not elsewhere classified: Secondary | ICD-10-CM | POA: Diagnosis not present

## 2023-03-03 NOTE — Therapy (Signed)
OUTPATIENT PHYSICAL THERAPY LOWER EXTREMITY EVALUATION   Patient Name: Jill Phillips MRN: [id number] DOB:20-Dec-1955, 67 y.o., female Today's Date: 03/03/2023  END OF SESSION:  PT End of Session - 03/03/23 0819     Visit Number 1    Date for PT Re-Evaluation 04/28/23    Progress Note Due on Visit 10    PT Start Time 0815    PT Stop Time 0845    PT Time Calculation (min) 30 min    Activity Tolerance Patient tolerated treatment well    Behavior During Therapy [facility] for tasks assessed/performed             Past Medical History:  Diagnosis Date   Active smoker    Anxiety    Calcifying tendinitis of shoulder    Chronic back pain    Chronic kidney disease 2022   related to potential overdose   Chronic pain syndrome    COPD (chronic obstructive pulmonary disease) (HCC)    Dyspnea    with exertion   Dysthymic disorder    Emphysema lung (HCC)    GERD (gastroesophageal reflux disease)    Heart murmur    for years, nothing to be concerned about   Herpes genitalia    History of blood transfusion 09/02/1978   related to "back surgery"   Hyperlipidemia    Hypertension    Hypothyroidism    Lumbago    Osteoarthrosis, unspecified whether generalized or localized, lower leg    Pain in joint, upper arm    Pneumothorax, left 01/31/2016   S/P Left posterior subcostal pain injection on 01/30/2016   PONV (postoperative nausea and vomiting)    gets nauseous  with longer surgery. Difficuty voiding after surgery   Postlaminectomy syndrome, thoracic region    Pre-diabetes    Primary localized osteoarthrosis, lower leg    Restless leg syndrome    Sleep apnea    s/p surgery- last sleep study 2011- doesnt use oxygen or machine at night as instructed,.   12/2014- Dr Craige Cotta  reports it is negative.   Thyroid disease    Past Surgical History:  Procedure Laterality Date   APPENDECTOMY     BACK SURGERY     18 back surgeries (2 thoracic & 16 lumbar) (01/31/2016)   DILATION AND CURETTAGE OF  UTERUS     HAMMER TOE SURGERY     IR RADIOLOGIST EVAL & MGMT  07/21/2017   JOINT REPLACEMENT     KNEE ARTHROSCOPY Right    LAPAROSCOPIC CHOLECYSTECTOMY     LUMBAR FUSION N/A 08/01/2015   Procedure: Right sided L1-2 and L2-3 transforaminal lumbar interbody fusion with cages, Extension of posterior fusion T12 to L3, Replaced pedicle screws bilaterally L1-L2 , Replaced left sided pedicle screws L-3. Instrumentation T12 to L3 using local bone graft, Vivigen allograft and cancellous chips;  Surgeon: Kerrin Champagne, MD;  Location: [facility] OR;  Service: Orthopedics;  Laterality: N/A;   LUMBAR LAMINECTOMY/DECOMPRESSION MICRODISCECTOMY N/A 01/28/2014   Procedure: Minimally Invasive Right  L1-2 Microdiscectomy;  Surgeon: Kerrin Champagne, MD;  Location: [facility] OR;  Service: Orthopedics;  Laterality: N/A;   REVERSE SHOULDER ARTHROPLASTY Left 09/09/2019   Procedure: LEFT REVERSE SHOULDER REPLACEMENT;  Surgeon: Cammy Copa, MD;  Location: [facility] OR;  Service: Orthopedics;  Laterality: Left;   TOTAL HIP ARTHROPLASTY Right    TOTAL KNEE ARTHROPLASTY  05/29/2012   Procedure: TOTAL KNEE ARTHROPLASTY;  Surgeon: Kathryne Hitch, MD;  Location: [facility] ORS;  Service: Orthopedics;  Laterality: Right;  Right  Total Knee Arthroplasty   TOTAL KNEE ARTHROPLASTY Left 02/11/2023   Procedure: LEFT TOTAL KNEE ARTHROPLASTY;  Surgeon: Kathryne Hitch, MD;  Location: [facility] OR;  Service: Orthopedics;  Laterality: Left;   TUBAL LIGATION     UVULOPALATOPHARYNGOPLASTY     VAGINAL HYSTERECTOMY     Patient Active Problem List   Diagnosis Date Noted   Status post total left knee replacement 02/11/2023   Urination decrease 12/18/2022   Chronic pelvic pain in female 09/11/2022   Pain of left thumb 09/11/2022   Chest pain 07/30/2022   Onychomycosis 10/04/2021   Itching 07/16/2021   AKI (acute kidney injury) (HCC) 02/04/2021   Acute gout 06/20/2020   Interstitial cystitis 01/21/2020   Status post shoulder surgery 09/09/2019   Nausea  08/19/2019   Anxiety 05/04/2019   Insomnia 05/04/2019   Syncope 12/08/2018   Disorder of rotator cuff syndrome of left shoulder and allied disorder 06/22/2018   Bilateral leg edema 06/04/2018   Type 2 diabetes mellitus without complication, without long-term current use of insulin (HCC) 12/02/2017   Sacral pain 09/17/2017   COPD (chronic obstructive pulmonary disease) (HCC) 04/21/2017   Lower back pain 03/03/2017   CKD (chronic kidney disease) stage 3, GFR 30-59 ml/min (HCC) 08/07/2016   GERD (gastroesophageal reflux disease) 08/07/2016   Chronic respiratory failure (HCC) 07/29/2016   Arthritis of carpometacarpal (CMC) joint of left thumb 07/09/2016   Pneumothorax on left 01/31/2016   Chronic, continuous use of opioids 09/06/2015   Degenerative disc disease, lumbar 08/01/2015    Class: Chronic   Spondylolisthesis of lumbar region 08/01/2015    Class: Chronic   Hypoxia 09/14/2014   Constipation 05/05/2014   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/14/2013   HTN (hypertension) 05/19/2013   HLD (hyperlipidemia) 05/19/2013   Depression 05/19/2013   Hypothyroidism 05/19/2013   Tobacco abuse    Osteoarthritis of both knees 11/18/2011   RLS (restless legs syndrome) 11/18/2011    PCP: Cheryll Cockayne, MD  REFERRING PROVIDER: Doneen Poisson, MD  REFERRING DIAG: s/p L TKA   THERAPY DIAG:  History of total knee arthroplasty, left  Difficulty in walking, not elsewhere classified  Decreased ROM of left knee  Rationale for Evaluation and Treatment: Rehabilitation  ONSET DATE: 02/11/23  SUBJECTIVE:   SUBJECTIVE STATEMENT: I am feeling a little dizzy this morning, I don't know why  PERTINENT HISTORY: Underwent L TKA 3 weeks ago.  Has completed home health care.  Referred now for outpt PT to continue her rehab PAIN:  Are you having pain? Yes: NPRS scale: 6/10 Pain location: L knee Pain description: deep Aggravating factors: twisting, turning Relieving factors: rest, meds massage  PRECAUTIONS:  None  WEIGHT BEARING RESTRICTIONS: No  FALLS:  Has patient fallen in last 6 months? No  LIVING ENVIRONMENT: Lives with: lives alone and lives with their daughter  daughter staying with pt temporarily while patient recovers Lives in: House/apartment Stairs: No Has following equipment at home: Environmental consultant - 2 wheeled  OCCUPATION: retired  PLOF: Independent  PATIENT GOALS:  Be able to walk with cane or nothing and be stable  NEXT MD VISIT: 03/25/23 with surgeon for knee  OBJECTIVE:   DIAGNOSTIC FINDINGS: na  PATIENT SURVEYS:  KOOS Jr 18/28, 42% deficit  COGNITION: Overall cognitive status: Within functional limits for tasks assessed     SENSATION: WFL  EDEMA: minimal L knee jt  POSTURE: scoliotic with concavity thoracic spine , kyphotic thoracic L knee flexed throughout gait cycle and with standing  PALPATION: Some tenderness distal quads L  and L achilles tendon  LOWER EXTREMITY ROM:  Active ROM Right eval Left eval  Hip flexion    Hip extension    Hip abduction    Hip adduction    Hip internal rotation    Hip external rotation    Knee flexion  68  Knee extension  -22  Ankle dorsiflexion    Ankle plantarflexion    Ankle inversion    Ankle eversion     (Blank rows = not tested)  LOWER EXTREMITY MMT:  MMT Right eval Left eval  Hip flexion 4 4  Hip extension    Hip abduction    Hip adduction    Hip internal rotation    Hip external rotation    Knee flexion  3-/5  Knee extension  3-/5  Ankle dorsiflexion    Ankle plantarflexion 4 4  Ankle inversion    Ankle eversion     (Blank rows = not tested)  FUNCTIONAL TESTS:  Timed up and go (TUG): 16.35  GAIT: Distance walked: 82' within clinic Assistive device utilized: Environmental consultant - 2 wheeled Level of assistance: Modified independence Comments: did walk 20' with st cane in R hand, but unsteady with weight bearing on L LE antalgic L, needed HHA   TODAY'S TREATMENT:                                                                                                                               DATE: 03/03/23: Eval, BP 86/52 after gait with st cane and pt c/o dizziness   PATIENT EDUCATION:  Education details: POC, goals Person educated: Patient Education method: Explanation, Demonstration, and Tactile cues Education comprehension: verbalized understanding, returned demonstration, verbal cues required, tactile cues required, and needs further education  HOME EXERCISE PROGRAM: Instructed in utilizing a strap or sheet for assisted L knee flexion stretch, to adjunct patient's current home program  ASSESSMENT:  CLINICAL IMPRESSION: Patient is a 67 y.o. female who was seen today for physical therapy evaluation and treatment for recovery of function following L TKA. She is 3 weeks post op, presents as expected with L knee pain, also very stiff with L knee ROM at this point, and reduced balance, reduced safety and efficiency for transfers, gait. Today was an abbreviated session as patient was 15 min late.  OBJECTIVE IMPAIRMENTS: decreased activity tolerance, decreased balance, decreased knowledge of condition, decreased mobility, difficulty walking, decreased ROM, decreased strength, and pain.   ACTIVITY LIMITATIONS: carrying, lifting, bending, sitting, squatting, stairs, transfers, and locomotion level  PARTICIPATION LIMITATIONS: meal prep, cleaning, laundry, driving, shopping, community activity, and yard work  PERSONAL FACTORS: Education, Financial risk analyst, Past/current experiences, Time since onset of injury/illness/exacerbation, Transportation, and 1-2 comorbidities: CKD, COPD  are also affecting patient's functional outcome.   REHAB POTENTIAL: Fair    CLINICAL DECISION MAKING: Stable/uncomplicated  EVALUATION COMPLEXITY: Low   GOALS: Goals reviewed with patient? Yes  SHORT TERM GOALS: Target date: 03/17/23 I HEP Baseline: Goal status: INITIAL  LONG TERM GOALS: Target date: 04/28/23  Improve ROM  L knee to -5 to 115 for better gait efficiency, transfer capability Baseline: -22 to 68 Goal status: INITIAL  2.  TUG equal to or less than 14 sec with LAD Baseline: 16.35 sec with r walker Goal status: INITIAL  3.  Improve KOOS JR score to 24% or less deficit for L knee Baseline: 42% DEFICIT Goal status: INITIAL  4.  Gait I and safe with st cane, over level terrain, parking lot, curb which is patient's goal Baseline: reliant on R Walker for all gait Goal status: INITIAL   PLAN:  PT FREQUENCY: 2x/week  PT DURATION: 8 weeks  PLANNED INTERVENTIONS: Therapeutic exercises, Therapeutic activity, Neuromuscular re-education, Balance training, Gait training, Patient/Family education, Self Care, and Joint mobilization  PLAN FOR NEXT SESSION: initiate the cardiovascular equipment , progress with flexibility, strengthening for L LE.     Early Chars, PT, DPT Board Certified Clinical Specialist in Orthopaedic Physical Therapy 03/03/2023, 12:06 PM

## 2023-03-05 ENCOUNTER — Encounter: Payer: Self-pay | Admitting: Physical Therapy

## 2023-03-05 ENCOUNTER — Ambulatory Visit: Payer: 59 | Admitting: Physical Therapy

## 2023-03-05 DIAGNOSIS — Z96652 Presence of left artificial knee joint: Secondary | ICD-10-CM | POA: Diagnosis not present

## 2023-03-05 DIAGNOSIS — M25662 Stiffness of left knee, not elsewhere classified: Secondary | ICD-10-CM | POA: Diagnosis not present

## 2023-03-05 DIAGNOSIS — R262 Difficulty in walking, not elsewhere classified: Secondary | ICD-10-CM

## 2023-03-05 NOTE — Therapy (Signed)
OUTPATIENT PHYSICAL THERAPY LOWER EXTREMITY EVALUATION   Patient Name: Jill Phillips MRN: [id number] DOB:1956/03/17, 67 y.o., female Today's Date: 03/05/2023  END OF SESSION:  PT End of Session - 03/05/23 1444     Visit Number 2    Date for PT Re-Evaluation 04/28/23    Progress Note Due on Visit 10    PT Start Time 1436   pt late   PT Stop Time 1512    PT Time Calculation (min) 36 min    Activity Tolerance Patient tolerated treatment well    Behavior During Therapy [facility] for tasks assessed/performed              Past Medical History:  Diagnosis Date   Active smoker    Anxiety    Calcifying tendinitis of shoulder    Chronic back pain    Chronic kidney disease 2022   related to potential overdose   Chronic pain syndrome    COPD (chronic obstructive pulmonary disease) (HCC)    Dyspnea    with exertion   Dysthymic disorder    Emphysema lung (HCC)    GERD (gastroesophageal reflux disease)    Heart murmur    for years, nothing to be concerned about   Herpes genitalia    History of blood transfusion 09/02/1978   related to "back surgery"   Hyperlipidemia    Hypertension    Hypothyroidism    Lumbago    Osteoarthrosis, unspecified whether generalized or localized, lower leg    Pain in joint, upper arm    Pneumothorax, left 01/31/2016   S/P Left posterior subcostal pain injection on 01/30/2016   PONV (postoperative nausea and vomiting)    gets nauseous  with longer surgery. Difficuty voiding after surgery   Postlaminectomy syndrome, thoracic region    Pre-diabetes    Primary localized osteoarthrosis, lower leg    Restless leg syndrome    Sleep apnea    s/p surgery- last sleep study 2011- doesnt use oxygen or machine at night as instructed,.   12/2014- Dr Craige Cotta  reports it is negative.   Thyroid disease    Past Surgical History:  Procedure Laterality Date   APPENDECTOMY     BACK SURGERY     18 back surgeries (2 thoracic & 16 lumbar) (01/31/2016)   DILATION AND  CURETTAGE OF UTERUS     HAMMER TOE SURGERY     IR RADIOLOGIST EVAL & MGMT  07/21/2017   JOINT REPLACEMENT     KNEE ARTHROSCOPY Right    LAPAROSCOPIC CHOLECYSTECTOMY     LUMBAR FUSION N/A 08/01/2015   Procedure: Right sided L1-2 and L2-3 transforaminal lumbar interbody fusion with cages, Extension of posterior fusion T12 to L3, Replaced pedicle screws bilaterally L1-L2 , Replaced left sided pedicle screws L-3. Instrumentation T12 to L3 using local bone graft, Vivigen allograft and cancellous chips;  Surgeon: Kerrin Champagne, MD;  Location: [facility] OR;  Service: Orthopedics;  Laterality: N/A;   LUMBAR LAMINECTOMY/DECOMPRESSION MICRODISCECTOMY N/A 01/28/2014   Procedure: Minimally Invasive Right  L1-2 Microdiscectomy;  Surgeon: Kerrin Champagne, MD;  Location: [facility] OR;  Service: Orthopedics;  Laterality: N/A;   REVERSE SHOULDER ARTHROPLASTY Left 09/09/2019   Procedure: LEFT REVERSE SHOULDER REPLACEMENT;  Surgeon: Cammy Copa, MD;  Location: [facility] OR;  Service: Orthopedics;  Laterality: Left;   TOTAL HIP ARTHROPLASTY Right    TOTAL KNEE ARTHROPLASTY  05/29/2012   Procedure: TOTAL KNEE ARTHROPLASTY;  Surgeon: Kathryne Hitch, MD;  Location: [facility] ORS;  Service: Orthopedics;  Laterality: Right;  Right Total Knee Arthroplasty   TOTAL KNEE ARTHROPLASTY Left 02/11/2023   Procedure: LEFT TOTAL KNEE ARTHROPLASTY;  Surgeon: Kathryne Hitch, MD;  Location: [facility] OR;  Service: Orthopedics;  Laterality: Left;   TUBAL LIGATION     UVULOPALATOPHARYNGOPLASTY     VAGINAL HYSTERECTOMY     Patient Active Problem List   Diagnosis Date Noted   Status post total left knee replacement 02/11/2023   Urination decrease 12/18/2022   Chronic pelvic pain in female 09/11/2022   Pain of left thumb 09/11/2022   Chest pain 07/30/2022   Onychomycosis 10/04/2021   Itching 07/16/2021   AKI (acute kidney injury) (HCC) 02/04/2021   Acute gout 06/20/2020   Interstitial cystitis 01/21/2020   Status post shoulder surgery  09/09/2019   Nausea 08/19/2019   Anxiety 05/04/2019   Insomnia 05/04/2019   Syncope 12/08/2018   Disorder of rotator cuff syndrome of left shoulder and allied disorder 06/22/2018   Bilateral leg edema 06/04/2018   Type 2 diabetes mellitus without complication, without long-term current use of insulin (HCC) 12/02/2017   Sacral pain 09/17/2017   COPD (chronic obstructive pulmonary disease) (HCC) 04/21/2017   Lower back pain 03/03/2017   CKD (chronic kidney disease) stage 3, GFR 30-59 ml/min (HCC) 08/07/2016   GERD (gastroesophageal reflux disease) 08/07/2016   Chronic respiratory failure (HCC) 07/29/2016   Arthritis of carpometacarpal (CMC) joint of left thumb 07/09/2016   Pneumothorax on left 01/31/2016   Chronic, continuous use of opioids 09/06/2015   Degenerative disc disease, lumbar 08/01/2015    Class: Chronic   Spondylolisthesis of lumbar region 08/01/2015    Class: Chronic   Hypoxia 09/14/2014   Constipation 05/05/2014   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/14/2013   HTN (hypertension) 05/19/2013   HLD (hyperlipidemia) 05/19/2013   Depression 05/19/2013   Hypothyroidism 05/19/2013   Tobacco abuse    Osteoarthritis of both knees 11/18/2011   RLS (restless legs syndrome) 11/18/2011    PCP: Cheryll Cockayne, MD  REFERRING PROVIDER: Doneen Poisson, MD  REFERRING DIAG: s/p L TKA   THERAPY DIAG:  History of total knee arthroplasty, left  Difficulty in walking, not elsewhere classified  Decreased ROM of left knee  Rationale for Evaluation and Treatment: Rehabilitation  ONSET DATE: 02/11/23  SUBJECTIVE:   SUBJECTIVE STATEMENT:  I forgot to take my pain pill. I just feel really stiff, I can get up and do stuff but if I sit down I get stiff so quick.   PERTINENT HISTORY: Underwent L TKA 3 weeks ago.  Has completed home health care.  Referred now for outpt PT to continue her rehab PAIN:  Are you having pain? Yes: NPRS scale: 5/10 Pain location: L knee Pain description: "bad aching  and stiffness"  Aggravating factors: twisting, turning Relieving factors: rest, meds massage  PRECAUTIONS: None  WEIGHT BEARING RESTRICTIONS: No  FALLS:  Has patient fallen in last 6 months? No  LIVING ENVIRONMENT: Lives with: lives alone and lives with their daughter  daughter staying with pt temporarily while patient recovers Lives in: House/apartment Stairs: No Has following equipment at home: Environmental consultant - 2 wheeled  OCCUPATION: retired  PLOF: Independent  PATIENT GOALS:  Be able to walk with cane or nothing and be stable  NEXT MD VISIT: 03/25/23 with surgeon for knee  OBJECTIVE:   DIAGNOSTIC FINDINGS: na  PATIENT SURVEYS:  KOOS Jr 18/28, 42% deficit  COGNITION: Overall cognitive status: Within functional limits for tasks assessed     SENSATION: WFL  EDEMA: minimal L knee  jt  POSTURE: scoliotic with concavity thoracic spine , kyphotic thoracic L knee flexed throughout gait cycle and with standing  PALPATION: Some tenderness distal quads L and L achilles tendon  LOWER EXTREMITY ROM:  Active ROM Right eval Left eval  Hip flexion    Hip extension    Hip abduction    Hip adduction    Hip internal rotation    Hip external rotation    Knee flexion  68  Knee extension  -22  Ankle dorsiflexion    Ankle plantarflexion    Ankle inversion    Ankle eversion     (Blank rows = not tested)  LOWER EXTREMITY MMT:  MMT Right eval Left eval  Hip flexion 4 4  Hip extension    Hip abduction    Hip adduction    Hip internal rotation    Hip external rotation    Knee flexion  3-/5  Knee extension  3-/5  Ankle dorsiflexion    Ankle plantarflexion 4 4  Ankle inversion    Ankle eversion     (Blank rows = not tested)  FUNCTIONAL TESTS:  Timed up and go (TUG): 16.35  GAIT: Distance walked: 68' within clinic Assistive device utilized: Environmental consultant - 2 wheeled Level of assistance: Modified independence Comments: did walk 20' with st cane in R hand, but unsteady with  weight bearing on L LE antalgic L, needed HHA   TODAY'S TREATMENT:                                                                                                                              DATE:   03/05/23  BP 138/98, HR 78BPM    TherEX  Scifit bike for ROM seat 10, partial rotations x6 minutes  SAQ 3# LE x12  Knee extension stretch heel prop + 3# on knee x3 minutes HS stretches 2x30 seconds L LE supine   Manual  Patella mobs all directions grade III Knee extension overpressure x10  Attempted knee flexion OP sitting, pain limited  IASTM to R quad with LEs elevated  Returned to knee flexion OP sitting edge of mat table x6 after IASTM (better tolerance)    03/03/23: Eval, BP 86/52 after gait with st cane and pt c/o dizziness   PATIENT EDUCATION:  Education details: POC, goals Person educated: Patient Education method: Explanation, Demonstration, and Tactile cues Education comprehension: verbalized understanding, returned demonstration, verbal cues required, tactile cues required, and needs further education  HOME EXERCISE PROGRAM: Instructed in utilizing a strap or sheet for assisted L knee flexion stretch, to adjunct patient's current home program  ASSESSMENT:  CLINICAL IMPRESSION:  Ms. Dlugos arrives today doing OK, BP much better this afternoon. Introduced bike for ROM, then focused on interventions for flexion/extension range, also quad strengthening and gait training as tolerated. Unfortunately she forgot to take her pain meds before PT, rest breaks and modifications provided PRN. Will continue progressions as appropriate.   OBJECTIVE IMPAIRMENTS:  decreased activity tolerance, decreased balance, decreased knowledge of condition, decreased mobility, difficulty walking, decreased ROM, decreased strength, and pain.   ACTIVITY LIMITATIONS: carrying, lifting, bending, sitting, squatting, stairs, transfers, and locomotion level  PARTICIPATION LIMITATIONS: meal prep,  cleaning, laundry, driving, shopping, community activity, and yard work  PERSONAL FACTORS: Education, Financial risk analyst, Past/current experiences, Time since onset of injury/illness/exacerbation, Transportation, and 1-2 comorbidities: CKD, COPD  are also affecting patient's functional outcome.   REHAB POTENTIAL: Fair    CLINICAL DECISION MAKING: Stable/uncomplicated  EVALUATION COMPLEXITY: Low   GOALS: Goals reviewed with patient? Yes  SHORT TERM GOALS: Target date: 03/17/23 I HEP Baseline: Goal status: INITIAL   LONG TERM GOALS: Target date: 04/28/23  Improve ROM L knee to -5 to 115 for better gait efficiency, transfer capability Baseline: -22 to 68 Goal status: INITIAL  2.  TUG equal to or less than 14 sec with LAD Baseline: 16.35 sec with r walker Goal status: INITIAL  3.  Improve KOOS JR score to 24% or less deficit for L knee Baseline: 42% DEFICIT Goal status: INITIAL  4.  Gait I and safe with st cane, over level terrain, parking lot, curb which is patient's goal Baseline: reliant on R Walker for all gait Goal status: INITIAL   PLAN:  PT FREQUENCY: 2x/week  PT DURATION: 8 weeks  PLANNED INTERVENTIONS: Therapeutic exercises, Therapeutic activity, Neuromuscular re-education, Balance training, Gait training, Patient/Family education, Self Care, and Joint mobilization  PLAN FOR NEXT SESSION: initiate the cardiovascular equipment , progress with flexibility, strengthening for L LE.  Manual as appropriate for flexion/extension ROM.    Nedra Hai, PT, DPT 03/05/23 3:16 PM

## 2023-03-10 ENCOUNTER — Ambulatory Visit: Payer: 59 | Admitting: Physical Therapy

## 2023-03-12 ENCOUNTER — Ambulatory Visit: Payer: 59 | Admitting: Physical Therapy

## 2023-03-13 ENCOUNTER — Telehealth: Payer: Self-pay | Admitting: Orthopedic Surgery

## 2023-03-13 NOTE — Telephone Encounter (Signed)
Mailed 03/14/2022 letter to patient per request today

## 2023-03-14 ENCOUNTER — Other Ambulatory Visit: Payer: Self-pay | Admitting: Internal Medicine

## 2023-03-17 ENCOUNTER — Ambulatory Visit: Payer: 59

## 2023-03-18 DIAGNOSIS — Z79899 Other long term (current) drug therapy: Secondary | ICD-10-CM | POA: Diagnosis not present

## 2023-03-18 DIAGNOSIS — G2581 Restless legs syndrome: Secondary | ICD-10-CM | POA: Diagnosis not present

## 2023-03-18 DIAGNOSIS — M545 Low back pain, unspecified: Secondary | ICD-10-CM | POA: Diagnosis not present

## 2023-03-18 DIAGNOSIS — J439 Emphysema, unspecified: Secondary | ICD-10-CM | POA: Diagnosis not present

## 2023-03-18 DIAGNOSIS — J449 Chronic obstructive pulmonary disease, unspecified: Secondary | ICD-10-CM | POA: Diagnosis not present

## 2023-03-19 ENCOUNTER — Ambulatory Visit: Payer: 59 | Admitting: Physical Therapy

## 2023-03-20 DIAGNOSIS — Z79899 Other long term (current) drug therapy: Secondary | ICD-10-CM | POA: Diagnosis not present

## 2023-03-24 ENCOUNTER — Ambulatory Visit: Payer: 59 | Admitting: Physical Therapy

## 2023-03-25 ENCOUNTER — Ambulatory Visit (INDEPENDENT_AMBULATORY_CARE_PROVIDER_SITE_OTHER): Payer: 59 | Admitting: Orthopaedic Surgery

## 2023-03-25 ENCOUNTER — Telehealth: Payer: Self-pay | Admitting: *Deleted

## 2023-03-25 DIAGNOSIS — Z96652 Presence of left artificial knee joint: Secondary | ICD-10-CM

## 2023-03-25 DIAGNOSIS — T8482XD Fibrosis due to internal orthopedic prosthetic devices, implants and grafts, subsequent encounter: Secondary | ICD-10-CM

## 2023-03-25 NOTE — Telephone Encounter (Signed)
Ortho bundle 30 day in office meeting completed.

## 2023-03-25 NOTE — Progress Notes (Signed)
The patient comes in today for routine follow-up at 6 weeks status post a left total knee replacement.  We replaced her right knee many years ago.  However she is now on chronic pain management and that is slow down her recovery from surgery in terms of her pain centers.  She has not been able to get to physical therapy as much due to transportation issues.  At their last note from early July she could flex to about 68 degrees and she lacks full extension by maybe 5 to 10 degrees.  She is working on a Field seismologist.  She is walking today without assistive device and says she is active doing well.  On my exam today her extension is improved to lacking full extension by maybe 5 degrees and I can flex her to 90 degrees.  This was actually getting easier and she did not fight me at all.  Since she is not able to get the therapy I do not feel that a manipulation or anesthesia is warranted.  However I do feel that she would benefit from an intra-articular steroid injection in that left knee since she is now making progress over the last 2 weeks in terms of her flexion and extension.  She agreed to this and tolerated the steroid injection well in her left knee.  We will see her back in 4 weeks to see how she is doing overall.  I would like an AP and lateral of her left knee at that visit.

## 2023-03-26 ENCOUNTER — Other Ambulatory Visit: Payer: Self-pay | Admitting: Internal Medicine

## 2023-03-26 ENCOUNTER — Ambulatory Visit: Payer: 59 | Admitting: Physical Therapy

## 2023-03-26 DIAGNOSIS — J449 Chronic obstructive pulmonary disease, unspecified: Secondary | ICD-10-CM | POA: Diagnosis not present

## 2023-03-26 DIAGNOSIS — R0602 Shortness of breath: Secondary | ICD-10-CM | POA: Diagnosis not present

## 2023-03-27 ENCOUNTER — Other Ambulatory Visit: Payer: Self-pay | Admitting: Internal Medicine

## 2023-03-27 DIAGNOSIS — G8929 Other chronic pain: Secondary | ICD-10-CM

## 2023-03-31 ENCOUNTER — Other Ambulatory Visit: Payer: Self-pay | Admitting: Internal Medicine

## 2023-04-03 DIAGNOSIS — H35033 Hypertensive retinopathy, bilateral: Secondary | ICD-10-CM | POA: Diagnosis not present

## 2023-04-03 DIAGNOSIS — H5203 Hypermetropia, bilateral: Secondary | ICD-10-CM | POA: Diagnosis not present

## 2023-04-03 DIAGNOSIS — H2513 Age-related nuclear cataract, bilateral: Secondary | ICD-10-CM | POA: Diagnosis not present

## 2023-04-03 DIAGNOSIS — H524 Presbyopia: Secondary | ICD-10-CM | POA: Diagnosis not present

## 2023-04-09 ENCOUNTER — Encounter: Payer: Self-pay | Admitting: Internal Medicine

## 2023-04-09 NOTE — Patient Instructions (Addendum)
      Blood work was ordered.   The lab is on the first floor.    Medications changes include :       A referral was ordered and someone will call you to schedule an appointment.     Return in about 6 months (around 10/11/2023) for Physical Exam.

## 2023-04-09 NOTE — Progress Notes (Signed)
Subjective:    Patient ID: Jill Phillips, female    DOB: 1955-10-13, 67 y.o.   MRN: [id number]     HPI Jill Phillips is here for follow up of her chronic medical problems.  Still using o2 at night -document  ?  Trial of Trelegy or Breztri ?  Taking gabapentin 3 times daily or just at bedtime-same as tizanidine  ?  Using Spiriva  Medications and allergies reviewed with patient and updated if appropriate.  Current Outpatient Medications on File Prior to Visit  Medication Sig Dispense Refill  . acetaminophen (TYLENOL) 500 MG tablet Take 1,000 mg by mouth daily as needed for moderate pain or headache.    . albuterol (VENTOLIN HFA) 108 (90 Base) MCG/ACT inhaler INHALE 2 PUFFS INTO THE LUNGS EVERY 6 HOURS AS NEEDED FOR WHEEZING OR SHORTNESS OF BREATH. 8.5 g 0  . amLODipine-benazepril (LOTREL) 10-40 MG capsule TAKE 1 CAPSULE BY MOUTH DAILY. 90 capsule 0  . atorvastatin (LIPITOR) 20 MG tablet TAKE 1 TABLET BY MOUTH DAILY. 90 tablet 1  . docusate sodium (COLACE) 100 MG capsule Take 200 mg by mouth at bedtime.    . fentaNYL (DURAGESIC) 25 MCG/HR Place 1 patch onto the skin every 3 (three) days.    [name] gabapentin (NEURONTIN) 400 MG capsule TAKE 1 CAPSULE BY MOUTH 3 TIMES DAILY. 270 capsule 0  . hydrochlorothiazide (HYDRODIURIL) 12.5 MG tablet TAKE 1 TABLET (12.5 MG TOTAL) BY MOUTH DAILY. (Patient taking differently: Take 12.5 mg by mouth daily as needed (headache).) 30 tablet 3  . HYDROcodone-acetaminophen (NORCO/VICODIN) 5-325 MG tablet Take 1-2 tablets by mouth every 4 (four) hours as needed for moderate pain. 30 tablet 0  . levothyroxine (SYNTHROID) 88 MCG tablet TAKE 1 TABLET BY MOUTH DAILY. 90 tablet 1  . NARCAN 4 MG/0.1ML LIQD nasal spray kit Place 1 spray into the nose as needed (accidental overdose). 1 each 0  . omeprazole (PRILOSEC) 40 MG capsule TAKE 1 CAPSULE BY MOUTH 2 TIMES DAILY BEFORE A MEAL. (Patient taking differently: Take 40 mg by mouth 2 (two) times daily before a meal.) 180  capsule 3  . oxyCODONE (OXY IR/ROXICODONE) 5 MG immediate release tablet Take 1-2 tablets (5-10 mg total) by mouth every 4 (four) hours as needed for moderate pain (pain score 4-6). 30 tablet 0  . Psyllium (VEGETABLE LAXATIVE PO) Take 1 tablet by mouth daily.    . QUEtiapine (SEROQUEL) 200 MG tablet TAKE 1 TABLET (200 MG TOTAL) BY MOUTH AT BEDTIME. 90 tablet 1  . rOPINIRole (REQUIP) 1 MG tablet Take 1-2 tablets (1-2 mg total) by mouth 2 (two) times daily as needed (restless leg). 60 tablet 0  . solifenacin (VESICARE) 5 MG tablet Take 10 mg by mouth daily.    . valACYclovir (VALTREX) 500 MG tablet TAKE 1 TABLET BY MOUTH DAILY. 90 tablet 1  . venlafaxine XR (EFFEXOR-XR) 150 MG 24 hr capsule TAKE 2 CAPSULES BY MOUTH DAILY. 180 capsule 1   No current facility-administered medications on file prior to visit.     Review of Systems     Objective:  There were no vitals filed for this visit. BP Readings from Last 3 Encounters:  05/28/23 104/70  02/14/23 107/66  02/13/23 106/62   Wt Readings from Last 3 Encounters:  05/28/23 142 lb (64.4 kg)  02/11/23 139 lb (63 kg)  02/04/23 139 lb 6.4 oz (63.2 kg)   There is no height or weight on file to calculate BMI.  Physical Exam     Lab Results  Component Value Date   WBC 8.9 05/28/2023   HGB 14.5 05/28/2023   HCT 45.6 05/28/2023   PLT 262.0 05/28/2023   GLUCOSE 87 05/28/2023   CHOL 180 05/28/2023   TRIG 236.0 (H) 05/28/2023   HDL 41.20 05/28/2023   LDLDIRECT 110.0 07/16/2021   LDLCALC 92 05/28/2023   ALT 9 05/28/2023   AST 16 05/28/2023   NA 142 05/28/2023   K 4.6 05/28/2023   CL 103 05/28/2023   CREATININE 1.30 (H) 05/28/2023   BUN 19 05/28/2023   CO2 32 05/28/2023   TSH 1.67 05/28/2023   INR 1.1 02/14/2023   HGBA1C 6.3 05/28/2023   MICROALBUR 6.0 (H) 05/28/2023     Assessment & Plan:    See Problem List for Assessment and Plan of chronic medical problems.    This encounter was created in error - please  disregard.

## 2023-04-10 ENCOUNTER — Encounter: Payer: 59 | Admitting: Internal Medicine

## 2023-04-10 DIAGNOSIS — J439 Emphysema, unspecified: Secondary | ICD-10-CM

## 2023-04-10 DIAGNOSIS — I10 Essential (primary) hypertension: Secondary | ICD-10-CM

## 2023-04-10 DIAGNOSIS — G2581 Restless legs syndrome: Secondary | ICD-10-CM

## 2023-04-10 DIAGNOSIS — G4709 Other insomnia: Secondary | ICD-10-CM

## 2023-04-10 DIAGNOSIS — E7849 Other hyperlipidemia: Secondary | ICD-10-CM

## 2023-04-10 DIAGNOSIS — F419 Anxiety disorder, unspecified: Secondary | ICD-10-CM

## 2023-04-10 DIAGNOSIS — N1832 Chronic kidney disease, stage 3b: Secondary | ICD-10-CM

## 2023-04-10 DIAGNOSIS — E119 Type 2 diabetes mellitus without complications: Secondary | ICD-10-CM

## 2023-04-10 DIAGNOSIS — F3289 Other specified depressive episodes: Secondary | ICD-10-CM

## 2023-04-10 DIAGNOSIS — E038 Other specified hypothyroidism: Secondary | ICD-10-CM

## 2023-04-10 DIAGNOSIS — K219 Gastro-esophageal reflux disease without esophagitis: Secondary | ICD-10-CM

## 2023-04-10 NOTE — Assessment & Plan Note (Signed)
Chronic Controlled, Stable Continue Effexor XR 300 mg daily, clonazepam 1 mg once bedtime

## 2023-04-10 NOTE — Assessment & Plan Note (Signed)
Chronic Has tried several medications in the past Seroquel not working great, but helps  Continue Seroquel 200 mg at bedtime

## 2023-04-10 NOTE — Assessment & Plan Note (Signed)
Chronic kidney disease CMP , CBC Stressed trying to decrease medications Stressed increase fluids Stressed smoking cessation  Stressed no NSAIDs Discussed importance of good blood pressure and sugar control

## 2023-04-10 NOTE — Assessment & Plan Note (Signed)
Chronic Taking gabapentin 400 mg nightly, tizanidine 4 mg nightly 

## 2023-04-10 NOTE — Assessment & Plan Note (Signed)
Chronic Controlled, Stable Continue Effexor XR 300 mg daily 

## 2023-04-10 NOTE — Assessment & Plan Note (Signed)
Chronic Still smoking-stressed smoking cessation Has chronic shortness of breath, intermittent cough and wheeze Continue albuterol as needed Currently on Spiriva daily-discussed changing to Trelegy, Markus Daft or [employer]

## 2023-04-10 NOTE — Assessment & Plan Note (Signed)
Chronic   Lab Results  Component Value Date   HGBA1C 5.8 (H) 02/12/2023   Sugars well controlled with lifestyle Check A1c Continue lifestyle control

## 2023-04-10 NOTE — Assessment & Plan Note (Signed)
Chronic  Check tsh and will titrate med dose if needed Currently taking levothyroxine 88 mcg daily

## 2023-04-10 NOTE — Assessment & Plan Note (Signed)
Chronic °Regular exercise and healthy diet encouraged °Check lipid panel  °Continue atorvastatin 20 mg daily °

## 2023-04-10 NOTE — Assessment & Plan Note (Signed)
Chronic Blood pressure well-controlled  She states blood pressure is typically well-controlled at home Continue amlodipine-benazepril 10-40 mg daily, bystolic 20 mg daily Continue hydrochlorothiazide 12.5 mg daily prn-she does not take this often CMP, CBC

## 2023-04-10 NOTE — Assessment & Plan Note (Signed)
Chronic °GERD controlled °Continue omeprazole 40 mg twice daily °

## 2023-04-21 DIAGNOSIS — J439 Emphysema, unspecified: Secondary | ICD-10-CM | POA: Diagnosis not present

## 2023-04-21 DIAGNOSIS — M545 Low back pain, unspecified: Secondary | ICD-10-CM | POA: Diagnosis not present

## 2023-04-21 DIAGNOSIS — J449 Chronic obstructive pulmonary disease, unspecified: Secondary | ICD-10-CM | POA: Diagnosis not present

## 2023-04-21 DIAGNOSIS — G2581 Restless legs syndrome: Secondary | ICD-10-CM | POA: Diagnosis not present

## 2023-04-21 DIAGNOSIS — Z79899 Other long term (current) drug therapy: Secondary | ICD-10-CM | POA: Diagnosis not present

## 2023-04-23 ENCOUNTER — Ambulatory Visit: Payer: 59 | Admitting: Internal Medicine

## 2023-04-23 ENCOUNTER — Encounter: Payer: Self-pay | Admitting: Orthopaedic Surgery

## 2023-04-23 ENCOUNTER — Ambulatory Visit (INDEPENDENT_AMBULATORY_CARE_PROVIDER_SITE_OTHER): Payer: 59 | Admitting: Orthopaedic Surgery

## 2023-04-23 ENCOUNTER — Other Ambulatory Visit (INDEPENDENT_AMBULATORY_CARE_PROVIDER_SITE_OTHER): Payer: 59

## 2023-04-23 DIAGNOSIS — Z96652 Presence of left artificial knee joint: Secondary | ICD-10-CM

## 2023-04-23 NOTE — Progress Notes (Signed)
The patient is now around 9 to 10 weeks status post a left total knee arthroplasty.  Her postoperative course was slow down a little bit but pain control and stiffness but now she has passed all of that and his reported good range of motion and strength.  She is walking without assistive device.  She looks good overall.  We replaced her right knee a long time ago.  Her left knee still has some swelling to be expected.  She lacks full extension by just a few degrees but her flexion is great.  I was able to flex her to 115 or more degrees.  The knee feels ligamentously stable.  2 views of the left knee show well-seated total knee arthroplasty with no complicating features.  From my standpoint she will continue to increase activities as comfort allows.  Will see her back in 6 months to see how she is doing overall but no x-rays are needed unless there are issues.

## 2023-04-24 DIAGNOSIS — Z79899 Other long term (current) drug therapy: Secondary | ICD-10-CM | POA: Diagnosis not present

## 2023-04-26 DIAGNOSIS — J449 Chronic obstructive pulmonary disease, unspecified: Secondary | ICD-10-CM | POA: Diagnosis not present

## 2023-04-26 DIAGNOSIS — R0602 Shortness of breath: Secondary | ICD-10-CM | POA: Diagnosis not present

## 2023-05-19 DIAGNOSIS — G2581 Restless legs syndrome: Secondary | ICD-10-CM | POA: Diagnosis not present

## 2023-05-19 DIAGNOSIS — E7800 Pure hypercholesterolemia, unspecified: Secondary | ICD-10-CM | POA: Diagnosis not present

## 2023-05-19 DIAGNOSIS — Z79899 Other long term (current) drug therapy: Secondary | ICD-10-CM | POA: Diagnosis not present

## 2023-05-19 DIAGNOSIS — M545 Low back pain, unspecified: Secondary | ICD-10-CM | POA: Diagnosis not present

## 2023-05-19 DIAGNOSIS — M549 Dorsalgia, unspecified: Secondary | ICD-10-CM | POA: Diagnosis not present

## 2023-05-19 DIAGNOSIS — J449 Chronic obstructive pulmonary disease, unspecified: Secondary | ICD-10-CM | POA: Diagnosis not present

## 2023-05-19 DIAGNOSIS — M129 Arthropathy, unspecified: Secondary | ICD-10-CM | POA: Diagnosis not present

## 2023-05-19 DIAGNOSIS — J439 Emphysema, unspecified: Secondary | ICD-10-CM | POA: Diagnosis not present

## 2023-05-19 DIAGNOSIS — R5383 Other fatigue: Secondary | ICD-10-CM | POA: Diagnosis not present

## 2023-05-19 DIAGNOSIS — E559 Vitamin D deficiency, unspecified: Secondary | ICD-10-CM | POA: Diagnosis not present

## 2023-05-19 DIAGNOSIS — M542 Cervicalgia: Secondary | ICD-10-CM | POA: Diagnosis not present

## 2023-05-19 DIAGNOSIS — Z131 Encounter for screening for diabetes mellitus: Secondary | ICD-10-CM | POA: Diagnosis not present

## 2023-05-21 ENCOUNTER — Other Ambulatory Visit: Payer: Self-pay | Admitting: Internal Medicine

## 2023-05-21 ENCOUNTER — Encounter: Payer: Self-pay | Admitting: Internal Medicine

## 2023-05-21 ENCOUNTER — Ambulatory Visit: Payer: 59 | Admitting: Orthopaedic Surgery

## 2023-05-22 DIAGNOSIS — Z79899 Other long term (current) drug therapy: Secondary | ICD-10-CM | POA: Diagnosis not present

## 2023-05-27 DIAGNOSIS — R0602 Shortness of breath: Secondary | ICD-10-CM | POA: Diagnosis not present

## 2023-05-27 DIAGNOSIS — J449 Chronic obstructive pulmonary disease, unspecified: Secondary | ICD-10-CM | POA: Diagnosis not present

## 2023-05-27 NOTE — Progress Notes (Unsigned)
Subjective:    Patient ID: Jill Phillips, female    DOB: 1956/02/12, 67 y.o.   MRN: [id number]     HPI Makeyla is here for follow up of her chronic medical problems.  Still using o2 at night -document  Will work on decreasing night time meds - tizinad to 1/2 dose at night time and 1/1 dose of clonzepam  ? UTI - she thinks she has had it for 3 weeks.  Has been taking AZO and using heating pad.     Medications and allergies reviewed with patient and updated if appropriate.  Current Outpatient Medications on File Prior to Visit  Medication Sig Dispense Refill   acetaminophen (TYLENOL) 500 MG tablet Take 1,000 mg by mouth daily as needed for moderate pain or headache.     albuterol (VENTOLIN HFA) 108 (90 Base) MCG/ACT inhaler INHALE 2 PUFFS INTO THE LUNGS EVERY 6 HOURS AS NEEDED FOR WHEEZING OR SHORTNESS OF BREATH. 8.5 g 0   amLODipine-benazepril (LOTREL) 10-40 MG capsule TAKE 1 CAPSULE BY MOUTH DAILY. 90 capsule 0   atorvastatin (LIPITOR) 20 MG tablet TAKE 1 TABLET BY MOUTH DAILY. 90 tablet 1   clonazePAM (KLONOPIN) 1 MG tablet TAKE 1 TABLET BY MOUTH AT BEDTIME. 30 tablet 2   docusate sodium (COLACE) 100 MG capsule Take 200 mg by mouth at bedtime.     fentaNYL (DURAGESIC) 25 MCG/HR Place 1 patch onto the skin every 3 (three) days.     gabapentin (NEURONTIN) 400 MG capsule TAKE 1 CAPSULE BY MOUTH 3 TIMES DAILY. 270 capsule 0   hydrochlorothiazide (HYDRODIURIL) 12.5 MG tablet TAKE 1 TABLET (12.5 MG TOTAL) BY MOUTH DAILY. (Patient taking differently: Take 12.5 mg by mouth daily as needed (headache).) 30 tablet 3   HYDROcodone-acetaminophen (NORCO/VICODIN) 5-325 MG tablet Take 1-2 tablets by mouth every 4 (four) hours as needed for moderate pain. 30 tablet 0   levothyroxine (SYNTHROID) 88 MCG tablet TAKE 1 TABLET BY MOUTH DAILY. 90 tablet 1   NARCAN 4 MG/0.1ML LIQD nasal spray kit Place 1 spray into the nose as needed (accidental overdose). 1 each 0   Nebivolol HCl 20 MG TABS TAKE 1  TABLET BY MOUTH DAILY. 90 tablet 1   omeprazole (PRILOSEC) 40 MG capsule TAKE 1 CAPSULE BY MOUTH 2 TIMES DAILY BEFORE A MEAL. (Patient taking differently: Take 40 mg by mouth 2 (two) times daily before a meal.) 180 capsule 3   oxyCODONE (OXY IR/ROXICODONE) 5 MG immediate release tablet Take 1-2 tablets (5-10 mg total) by mouth every 4 (four) hours as needed for moderate pain (pain score 4-6). 30 tablet 0   Psyllium (VEGETABLE LAXATIVE PO) Take 1 tablet by mouth daily.     QUEtiapine (SEROQUEL) 200 MG tablet TAKE 1 TABLET (200 MG TOTAL) BY MOUTH AT BEDTIME. 90 tablet 1   rOPINIRole (REQUIP) 1 MG tablet Take 1-2 tablets (1-2 mg total) by mouth 2 (two) times daily as needed (restless leg). 60 tablet 0   solifenacin (VESICARE) 5 MG tablet Take 10 mg by mouth daily.     tiZANidine (ZANAFLEX) 4 MG tablet TAKE 1 TABLET BY MOUTH EVERY 8 HOURS AS NEEDED. 90 tablet 0   valACYclovir (VALTREX) 500 MG tablet TAKE 1 TABLET BY MOUTH DAILY. 90 tablet 1   venlafaxine XR (EFFEXOR-XR) 150 MG 24 hr capsule TAKE 2 CAPSULES BY MOUTH DAILY. 180 capsule 1   No current facility-administered medications on file prior to visit.     Review of Systems  Constitutional:  Negative for fever.  Respiratory:  Positive for cough (lots of congestion). Negative for shortness of breath and wheezing.   Cardiovascular:  Positive for chest pain (sometimes).  Gastrointestinal:  Positive for abdominal pain (suprapubic region) and nausea.  Genitourinary:  Positive for decreased urine volume, difficulty urinating, dysuria, frequency and urgency. Negative for hematuria.       Bladder pain  Neurological:  Positive for light-headedness (occ) and headaches (occ).       Objective:   Vitals:   05/28/23 1112  BP: 104/70  Pulse: 64  Temp: 98.2 F (36.8 C)  SpO2: 92%   BP Readings from Last 3 Encounters:  05/28/23 104/70  02/14/23 107/66  02/13/23 106/62   Wt Readings from Last 3 Encounters:  05/28/23 142 lb (64.4 kg)  02/11/23  139 lb (63 kg)  02/04/23 139 lb 6.4 oz (63.2 kg)   Body mass index is 25.56 kg/m.    Physical Exam Constitutional:      General: She is not in acute distress.    Appearance: Normal appearance.  HENT:     Head: Normocephalic and atraumatic.  Eyes:     Conjunctiva/sclera: Conjunctivae normal.  Cardiovascular:     Rate and Rhythm: Normal rate and regular rhythm.     Heart sounds: Normal heart sounds.  Pulmonary:     Effort: Pulmonary effort is normal. No respiratory distress.     Breath sounds: Normal breath sounds. No wheezing.  Musculoskeletal:     Cervical back: Neck supple.     Right lower leg: No edema.     Left lower leg: No edema.  Lymphadenopathy:     Cervical: No cervical adenopathy.  Skin:    General: Skin is warm and dry.     Findings: No rash.  Neurological:     Mental Status: She is alert. Mental status is at baseline.  Psychiatric:        Mood and Affect: Mood normal.        Behavior: Behavior normal.        Lab Results  Component Value Date   WBC 10.4 02/14/2023   HGB 10.9 (L) 02/14/2023   HCT 32.0 (L) 02/14/2023   PLT 166 02/14/2023   GLUCOSE 99 02/14/2023   CHOL 223 (H) 07/30/2022   TRIG 167.0 (H) 07/30/2022   HDL 50.00 07/30/2022   LDLDIRECT 110.0 07/16/2021   LDLCALC 139 (H) 07/30/2022   ALT 14 02/14/2023   AST 20 02/14/2023   NA 137 02/14/2023   K 3.7 02/14/2023   CL 104 02/14/2023   CREATININE 1.05 (H) 02/14/2023   BUN 19 02/14/2023   CO2 25 02/14/2023   TSH 1.30 12/18/2022   INR 1.1 02/14/2023   HGBA1C 5.8 (H) 02/12/2023   MICROALBUR 0.8 01/11/2022     Assessment & Plan:    See Problem List for Assessment and Plan of chronic medical problems.

## 2023-05-27 NOTE — Patient Instructions (Addendum)
      Blood work was ordered.   The lab is on the first floor.    Medications changes include :   none    A referral was ordered and someone will call you to schedule an appointment.     Return in about 6 months (around 11/25/2023) for follow up.

## 2023-05-28 ENCOUNTER — Encounter: Payer: Self-pay | Admitting: Internal Medicine

## 2023-05-28 ENCOUNTER — Ambulatory Visit (INDEPENDENT_AMBULATORY_CARE_PROVIDER_SITE_OTHER): Payer: 59 | Admitting: Internal Medicine

## 2023-05-28 VITALS — BP 104/70 | HR 64 | Temp 98.2°F | Ht 62.5 in | Wt 142.0 lb

## 2023-05-28 DIAGNOSIS — J9611 Chronic respiratory failure with hypoxia: Secondary | ICD-10-CM

## 2023-05-28 DIAGNOSIS — M25562 Pain in left knee: Secondary | ICD-10-CM

## 2023-05-28 DIAGNOSIS — K219 Gastro-esophageal reflux disease without esophagitis: Secondary | ICD-10-CM | POA: Diagnosis not present

## 2023-05-28 DIAGNOSIS — E7849 Other hyperlipidemia: Secondary | ICD-10-CM

## 2023-05-28 DIAGNOSIS — I10 Essential (primary) hypertension: Secondary | ICD-10-CM | POA: Diagnosis not present

## 2023-05-28 DIAGNOSIS — G4709 Other insomnia: Secondary | ICD-10-CM | POA: Diagnosis not present

## 2023-05-28 DIAGNOSIS — G2581 Restless legs syndrome: Secondary | ICD-10-CM | POA: Diagnosis not present

## 2023-05-28 DIAGNOSIS — J439 Emphysema, unspecified: Secondary | ICD-10-CM

## 2023-05-28 DIAGNOSIS — F3289 Other specified depressive episodes: Secondary | ICD-10-CM

## 2023-05-28 DIAGNOSIS — R300 Dysuria: Secondary | ICD-10-CM

## 2023-05-28 DIAGNOSIS — G8929 Other chronic pain: Secondary | ICD-10-CM

## 2023-05-28 DIAGNOSIS — E119 Type 2 diabetes mellitus without complications: Secondary | ICD-10-CM

## 2023-05-28 DIAGNOSIS — N3000 Acute cystitis without hematuria: Secondary | ICD-10-CM

## 2023-05-28 DIAGNOSIS — N1832 Chronic kidney disease, stage 3b: Secondary | ICD-10-CM | POA: Diagnosis not present

## 2023-05-28 DIAGNOSIS — F17210 Nicotine dependence, cigarettes, uncomplicated: Secondary | ICD-10-CM

## 2023-05-28 DIAGNOSIS — E038 Other specified hypothyroidism: Secondary | ICD-10-CM | POA: Diagnosis not present

## 2023-05-28 DIAGNOSIS — F419 Anxiety disorder, unspecified: Secondary | ICD-10-CM

## 2023-05-28 LAB — COMPREHENSIVE METABOLIC PANEL
ALT: 9 U/L (ref 0–35)
AST: 16 U/L (ref 0–37)
Albumin: 4 g/dL (ref 3.5–5.2)
Alkaline Phosphatase: 135 U/L — ABNORMAL HIGH (ref 39–117)
BUN: 19 mg/dL (ref 6–23)
CO2: 32 mEq/L (ref 19–32)
Calcium: 9.4 mg/dL (ref 8.4–10.5)
Chloride: 103 mEq/L (ref 96–112)
Creatinine, Ser: 1.3 mg/dL — ABNORMAL HIGH (ref 0.40–1.20)
GFR: 42.31 mL/min — ABNORMAL LOW (ref >60.00)
Glucose, Bld: 87 mg/dL (ref 70–99)
Potassium: 4.6 mEq/L (ref 3.5–5.1)
Sodium: 142 mEq/L (ref 135–145)
Total Bilirubin: 0.5 mg/dL (ref 0.2–1.2)
Total Protein: 7.2 g/dL (ref 6.0–8.3)

## 2023-05-28 LAB — LIPID PANEL
Cholesterol: 180 mg/dL (ref 0–200)
HDL: 41.2 mg/dL (ref >39.00)
LDL Cholesterol: 92 mg/dL (ref 0–99)
NonHDL: 139.28
Total CHOL/HDL Ratio: 4
Triglycerides: 236 mg/dL — ABNORMAL HIGH (ref 0.0–149.0)
VLDL: 47.2 mg/dL — ABNORMAL HIGH (ref 0.0–40.0)

## 2023-05-28 LAB — CBC WITH DIFFERENTIAL/PLATELET
Basophils Absolute: 0.1 10*3/uL (ref 0.0–0.1)
Basophils Relative: 0.6 % (ref 0.0–3.0)
Eosinophils Absolute: 0.1 10*3/uL (ref 0.0–0.7)
Eosinophils Relative: 1.3 % (ref 0.0–5.0)
HCT: 45.6 % (ref 36.0–46.0)
Hemoglobin: 14.5 g/dL (ref 12.0–15.0)
Lymphocytes Relative: 28 % (ref 12.0–46.0)
Lymphs Abs: 2.5 10*3/uL (ref 0.7–4.0)
MCHC: 31.7 g/dL (ref 30.0–36.0)
MCV: 96.8 fl (ref 78.0–100.0)
Monocytes Absolute: 0.6 10*3/uL (ref 0.1–1.0)
Monocytes Relative: 6.4 % (ref 3.0–12.0)
Neutro Abs: 5.6 10*3/uL (ref 1.4–7.7)
Neutrophils Relative %: 63.7 % (ref 43.0–77.0)
Platelets: 262 10*3/uL (ref 150.0–400.0)
RBC: 4.71 Mil/uL (ref 3.87–5.11)
RDW: 14.3 % (ref 11.5–15.5)
WBC: 8.9 10*3/uL (ref 4.0–10.5)

## 2023-05-28 LAB — POC URINALSYSI DIPSTICK (AUTOMATED)
Blood, UA: NEGATIVE
Glucose, UA: NEGATIVE
Ketones, UA: POSITIVE
Nitrite, UA: POSITIVE
Protein, UA: POSITIVE — AB
Spec Grav, UA: >=1.03 — AB (ref 1.010–1.025)
Urobilinogen, UA: 2 E.U./dL — AB
pH, UA: 5 (ref 5.0–8.0)

## 2023-05-28 LAB — MICROALBUMIN / CREATININE URINE RATIO
Creatinine,U: 260.9 mg/dL
Microalb Creat Ratio: 2.3 mg/g (ref 0.0–30.0)
Microalb, Ur: 6 mg/dL — ABNORMAL HIGH (ref 0.0–1.9)

## 2023-05-28 LAB — TSH: TSH: 1.67 u[IU]/mL (ref 0.35–5.50)

## 2023-05-28 LAB — HEMOGLOBIN A1C: Hgb A1c MFr Bld: 6.3 % (ref 4.6–6.5)

## 2023-05-28 MED ORDER — TIZANIDINE HCL 4 MG PO TABS
4.0000 mg | ORAL_TABLET | Freq: Three times a day (TID) | ORAL | 1 refills | Status: DC | PRN
Start: 2023-05-28 — End: 2023-07-25

## 2023-05-28 MED ORDER — CEPHALEXIN 500 MG PO CAPS: 500.0000 mg | ORAL_CAPSULE | Freq: Two times a day (BID) | ORAL | 0 refills | Status: DC

## 2023-05-28 MED ORDER — BREZTRI AEROSPHERE 160-9-4.8 MCG/ACT IN AERO: 2.0000 | INHALATION_SPRAY | Freq: Two times a day (BID) | RESPIRATORY_TRACT | 5 refills | Status: DC

## 2023-05-28 MED ORDER — NICOTINE 21 MG/24HR TD PT24: 21.0000 mg | MEDICATED_PATCH | Freq: Every day | TRANSDERMAL | 2 refills | Status: DC

## 2023-05-28 MED ORDER — NEBIVOLOL HCL 10 MG PO TABS: 10.0000 mg | ORAL_TABLET | Freq: Every day | ORAL | 0 refills | Status: DC

## 2023-05-28 MED ORDER — FLUCONAZOLE 150 MG PO TABS: 150.0000 mg | ORAL_TABLET | Freq: Once | ORAL | 0 refills | Status: AC

## 2023-05-28 NOTE — Assessment & Plan Note (Signed)
Chronic GERD controlled Continue omeprazole 40 mg twice daily

## 2023-05-28 NOTE — Assessment & Plan Note (Signed)
Chronic Has chronic cough with chest congestion, denies SOB, wheeze, but not very active Not using any inhalers Start breztri 2 puffs bid

## 2023-05-28 NOTE — Assessment & Plan Note (Signed)
Chronic Blood pressure on low side and has been low for a while Decrease bystolic to 10 mg daily Continue amlodipine-benazepril 10-40 mg daily Continue hydrochlorothiazide 12.5 mg daily prn-she does not take this often Monitor BP at home CMP, CBC

## 2023-05-28 NOTE — Assessment & Plan Note (Signed)
Chronic Controlled, Stable Continue Effexor XR 300 mg daily, clonazepam 1 mg once bedtime - will start working on decreasing clonazepam to 0.5 mg nightly

## 2023-05-28 NOTE — Assessment & Plan Note (Signed)
Chronic Controlled, Stable Continue Effexor XR 300 mg daily

## 2023-05-28 NOTE — Assessment & Plan Note (Signed)
Chronic Taking gabapentin 400 mg nightly, tizanidine 4 mg nightly On requip

## 2023-05-28 NOTE — Assessment & Plan Note (Signed)
Chronic Regular exercise and healthy diet encouraged Check lipid panel  Continue atorvastatin 20 mg daily

## 2023-05-28 NOTE — Assessment & Plan Note (Addendum)
Chronic Sleeps through the night - discussed that she is on too much medication - she will decrease clonazepam to 0.5 mg nightly and decrease tizanidine dose Would like to continue to decrease her medications - discussed high risk due to polypharmacy Controlled Continue seroquel 200 mg nightly

## 2023-05-28 NOTE — Assessment & Plan Note (Signed)
Chronic Has oxygen at home but does not use it Advised checking oxygen saturation at home

## 2023-05-28 NOTE — Assessment & Plan Note (Signed)
Chronic   Lab Results  Component Value Date   HGBA1C 5.8 (H) 02/12/2023   Sugars well controlled with lifestyle Check A1c Continue lifestyle control

## 2023-05-28 NOTE — Assessment & Plan Note (Signed)
Acute Urine dip consistent with UTI Will send urine for culture Take the antibiotic as prescribed.  Keflex 500 mg bid x 1 week - listed as allergy but only due to it causing yeast infection Take tylenol if needed.   Increase your water intake.  Call if no improvement

## 2023-05-28 NOTE — Assessment & Plan Note (Signed)
Chronic Interested in quitting smoking Will send in nicotine patches

## 2023-05-28 NOTE — Addendum Note (Signed)
Addended by: Karma Ganja on: 05/28/2023 01:38 PM   Modules accepted: Orders

## 2023-05-28 NOTE — Assessment & Plan Note (Signed)
Chronic  Check tsh and will titrate med dose if needed Currently taking levothyroxine 88 mcg daily

## 2023-05-28 NOTE — Assessment & Plan Note (Signed)
Chronic kidney disease CMP , CBC Stressed trying to decrease medications - will work on decreasing clonazepam and tizanidine doses Stressed increase fluids Stressed smoking cessation  - rx'd nicotine patches Stressed no NSAIDs Discussed importance of good blood pressure and sugar control - BP on the low side - will adjust meds

## 2023-05-31 LAB — CULTURE, URINE COMPREHENSIVE

## 2023-06-19 DIAGNOSIS — J449 Chronic obstructive pulmonary disease, unspecified: Secondary | ICD-10-CM | POA: Diagnosis not present

## 2023-06-19 DIAGNOSIS — Z79899 Other long term (current) drug therapy: Secondary | ICD-10-CM | POA: Diagnosis not present

## 2023-06-19 DIAGNOSIS — J439 Emphysema, unspecified: Secondary | ICD-10-CM | POA: Diagnosis not present

## 2023-06-19 DIAGNOSIS — G2581 Restless legs syndrome: Secondary | ICD-10-CM | POA: Diagnosis not present

## 2023-06-19 DIAGNOSIS — R030 Elevated blood-pressure reading, without diagnosis of hypertension: Secondary | ICD-10-CM | POA: Diagnosis not present

## 2023-06-19 DIAGNOSIS — M545 Low back pain, unspecified: Secondary | ICD-10-CM | POA: Diagnosis not present

## 2023-06-23 DIAGNOSIS — Z79899 Other long term (current) drug therapy: Secondary | ICD-10-CM | POA: Diagnosis not present

## 2023-06-24 ENCOUNTER — Other Ambulatory Visit: Payer: Self-pay | Admitting: Internal Medicine

## 2023-06-26 DIAGNOSIS — R0602 Shortness of breath: Secondary | ICD-10-CM | POA: Diagnosis not present

## 2023-06-26 DIAGNOSIS — J449 Chronic obstructive pulmonary disease, unspecified: Secondary | ICD-10-CM | POA: Diagnosis not present

## 2023-06-27 ENCOUNTER — Telehealth: Payer: Self-pay | Admitting: *Deleted

## 2023-06-27 NOTE — Telephone Encounter (Signed)
Ortho bundle 90 day call completed. 

## 2023-07-18 ENCOUNTER — Ambulatory Visit (INDEPENDENT_AMBULATORY_CARE_PROVIDER_SITE_OTHER): Payer: 59

## 2023-07-18 VITALS — Ht 62.5 in | Wt 145.0 lb

## 2023-07-18 DIAGNOSIS — Z1231 Encounter for screening mammogram for malignant neoplasm of breast: Secondary | ICD-10-CM

## 2023-07-18 DIAGNOSIS — Z0000 Encounter for general adult medical examination without abnormal findings: Secondary | ICD-10-CM

## 2023-07-18 NOTE — Progress Notes (Unsigned)
Subjective:   Jill Phillips is a 67 y.o. female who presents for Medicare Annual (Subsequent) preventive examination.  Visit Complete: Virtual I connected with  Brett Canales on 07/18/23 by a audio enabled telemedicine application and verified that I am speaking with the correct person using two identifiers.  Patient Location: Home  Provider Location: Office/Clinic  I discussed the limitations of evaluation and management by telemedicine. The patient expressed understanding and agreed to proceed.  Vital Signs: Because this visit was a virtual/telehealth visit, some criteria may be missing or patient reported. Any vitals not documented were not able to be obtained and vitals that have been documented are patient reported.  Cardiac Risk Factors include: advanced age (>77men, >5 women);hypertension;Other (see comment);diabetes mellitus;dyslipidemia, Risk factor comments: COPD, CKD, AKI     Objective:    Today's Vitals   07/18/23 1032 07/18/23 1033  Weight: 145 lb (65.8 kg)   Height: 5' 2.5" (1.588 m)   PainSc:  3    Body mass index is 26.1 kg/m.     07/18/2023   10:48 AM 02/14/2023    6:40 PM 02/13/2023   12:16 PM 02/04/2023    1:59 PM 12/13/2022    6:08 PM 08/08/2022   11:10 AM 07/15/2022    4:11 PM  Advanced Directives  Does Patient Have a Medical Advance Directive? No No No No No No No  Would patient like information on creating a medical advance directive?  No - Patient declined No - Patient declined No - Patient declined No - Patient declined No - Patient declined No - Patient declined    Current Medications (verified) Outpatient Encounter Medications as of 07/18/2023  Medication Sig   acetaminophen (TYLENOL) 500 MG tablet Take 1,000 mg by mouth daily as needed for moderate pain or headache.   albuterol (VENTOLIN HFA) 108 (90 Base) MCG/ACT inhaler INHALE 2 PUFFS INTO THE LUNGS EVERY 6 HOURS AS NEEDED FOR WHEEZING OR SHORTNESS OF BREATH.   amLODipine-benazepril  (LOTREL) 10-40 MG capsule TAKE 1 CAPSULE BY MOUTH DAILY.   atorvastatin (LIPITOR) 20 MG tablet TAKE 1 TABLET BY MOUTH DAILY.   clonazePAM (KLONOPIN) 1 MG tablet TAKE 1 TABLET BY MOUTH AT BEDTIME.   fentaNYL (DURAGESIC) 25 MCG/HR Place 1 patch onto the skin every 3 (three) days.   gabapentin (NEURONTIN) 400 MG capsule TAKE 1 CAPSULE BY MOUTH 3 TIMES DAILY.   hydrochlorothiazide (HYDRODIURIL) 12.5 MG tablet TAKE 1 TABLET (12.5 MG TOTAL) BY MOUTH DAILY. (Patient taking differently: Take 12.5 mg by mouth daily as needed (headache).)   HYDROcodone-acetaminophen (NORCO/VICODIN) 5-325 MG tablet Take 1-2 tablets by mouth every 4 (four) hours as needed for moderate pain.   levothyroxine (SYNTHROID) 88 MCG tablet TAKE 1 TABLET BY MOUTH DAILY.   NARCAN 4 MG/0.1ML LIQD nasal spray kit Place 1 spray into the nose as needed (accidental overdose).   nebivolol (BYSTOLIC) 10 MG tablet Take 1 tablet (10 mg total) by mouth daily.   nicotine (NICODERM CQ - DOSED IN MG/24 HOURS) 21 mg/24hr patch Place 1 patch (21 mg total) onto the skin daily.   omeprazole (PRILOSEC) 40 MG capsule TAKE 1 CAPSULE BY MOUTH 2 TIMES DAILY BEFORE A MEAL. (Patient taking differently: Take 40 mg by mouth 2 (two) times daily before a meal.)   Psyllium (VEGETABLE LAXATIVE PO) Take 1 tablet by mouth daily.   QUEtiapine (SEROQUEL) 200 MG tablet TAKE 1 TABLET (200 MG TOTAL) BY MOUTH AT BEDTIME.   rOPINIRole (REQUIP) 1 MG tablet Take 1-2  tablets (1-2 mg total) by mouth 2 (two) times daily as needed (restless leg).   solifenacin (VESICARE) 5 MG tablet Take 10 mg by mouth daily.   tiZANidine (ZANAFLEX) 4 MG tablet Take 1 tablet (4 mg total) by mouth every 8 (eight) hours as needed.   valACYclovir (VALTREX) 500 MG tablet TAKE 1 TABLET BY MOUTH DAILY.   venlafaxine XR (EFFEXOR-XR) 150 MG 24 hr capsule TAKE 2 CAPSULES BY MOUTH DAILY.   Budeson-Glycopyrrol-Formoterol (BREZTRI AEROSPHERE) 160-9-4.8 MCG/ACT AERO Inhale 2 puffs into the lungs 2 (two) times  daily.   cephALEXin (KEFLEX) 500 MG capsule Take 1 capsule (500 mg total) by mouth 2 (two) times daily. (Patient not taking: Reported on 07/18/2023)   docusate sodium (COLACE) 100 MG capsule Take 200 mg by mouth at bedtime. (Patient not taking: Reported on 07/18/2023)   oxyCODONE (OXY IR/ROXICODONE) 5 MG immediate release tablet Take 1-2 tablets (5-10 mg total) by mouth every 4 (four) hours as needed for moderate pain (pain score 4-6). (Patient not taking: Reported on 07/18/2023)   No facility-administered encounter medications on file as of 07/18/2023.    Allergies (verified) Flagyl [metronidazole], Limonene, Sulfa antibiotics, Doxycycline, Ibuprofen, Amoxicillin, Chlorzoxazone, Codeine, Darvocet [propoxyphene n-acetaminophen], Dilaudid [hydromorphone hcl], Hydralazine, Keflex [cephalexin], Morphine and codeine, Nitrofurantoin monohyd macro, Percocet [oxycodone-acetaminophen], and Trazodone and nefazodone   History: Past Medical History:  Diagnosis Date   Active smoker    Anxiety    Calcifying tendinitis of shoulder    Chronic back pain    Chronic kidney disease 2022   related to potential overdose   Chronic pain syndrome    COPD (chronic obstructive pulmonary disease) (HCC)    Dyspnea    with exertion   Dysthymic disorder    Emphysema lung (HCC)    GERD (gastroesophageal reflux disease)    Heart murmur    for years, nothing to be concerned about   Herpes genitalia    History of blood transfusion 09/02/1978   related to "back surgery"   Hyperlipidemia    Hypertension    Hypothyroidism    Lumbago    Osteoarthrosis, unspecified whether generalized or localized, lower leg    Pain in joint, upper arm    Pneumothorax, left 01/31/2016   S/P Left posterior subcostal pain injection on 01/30/2016   PONV (postoperative nausea and vomiting)    gets nauseous  with longer surgery. Difficuty voiding after surgery   Postlaminectomy syndrome, thoracic region    Pre-diabetes    Primary  localized osteoarthrosis, lower leg    Restless leg syndrome    Sleep apnea    s/p surgery- last sleep study 2011- doesnt use oxygen or machine at night as instructed,.   12/2014- Dr Craige Cotta  reports it is negative.   Thyroid disease    Past Surgical History:  Procedure Laterality Date   APPENDECTOMY     BACK SURGERY     18 back surgeries (2 thoracic & 16 lumbar) (01/31/2016)   DILATION AND CURETTAGE OF UTERUS     HAMMER TOE SURGERY     IR RADIOLOGIST EVAL & MGMT  07/21/2017   JOINT REPLACEMENT     KNEE ARTHROSCOPY Right    LAPAROSCOPIC CHOLECYSTECTOMY     LUMBAR FUSION N/A 08/01/2015   Procedure: Right sided L1-2 and L2-3 transforaminal lumbar interbody fusion with cages, Extension of posterior fusion T12 to L3, Replaced pedicle screws bilaterally L1-L2 , Replaced left sided pedicle screws L-3. Instrumentation T12 to L3 using local bone graft, Vivigen allograft and cancellous chips;  Surgeon: Kerrin Champagne, MD;  Location: [facility] OR;  Service: Orthopedics;  Laterality: N/A;   LUMBAR LAMINECTOMY/DECOMPRESSION MICRODISCECTOMY N/A 01/28/2014   Procedure: Minimally Invasive Right  L1-2 Microdiscectomy;  Surgeon: Kerrin Champagne, MD;  Location: [facility] OR;  Service: Orthopedics;  Laterality: N/A;   REVERSE SHOULDER ARTHROPLASTY Left 09/09/2019   Procedure: LEFT REVERSE SHOULDER REPLACEMENT;  Surgeon: Cammy Copa, MD;  Location: [facility] OR;  Service: Orthopedics;  Laterality: Left;   TOTAL HIP ARTHROPLASTY Right    TOTAL KNEE ARTHROPLASTY  05/29/2012   Procedure: TOTAL KNEE ARTHROPLASTY;  Surgeon: Kathryne Hitch, MD;  Location: [facility] ORS;  Service: Orthopedics;  Laterality: Right;  Right Total Knee Arthroplasty   TOTAL KNEE ARTHROPLASTY Left 02/11/2023   Procedure: LEFT TOTAL KNEE ARTHROPLASTY;  Surgeon: Kathryne Hitch, MD;  Location: [facility] OR;  Service: Orthopedics;  Laterality: Left;   TUBAL LIGATION     UVULOPALATOPHARYNGOPLASTY     VAGINAL HYSTERECTOMY     Family History  Problem Relation Age  of Onset   Kidney disease Mother    Heart disease Father    Heart disease Sister 45       s/p CABG   Hypertension Sister    Anuerysm Brother 59       brain   Heart disease Brother    Colon cancer Neg Hx    Breast cancer Neg Hx    Ovarian cancer Neg Hx    Social History   Socioeconomic History   Marital status: Divorced    Spouse name: n/a   Number of children: 2   Years of education: 12+   Highest education level: Not on file  Occupational History   Occupation: disability    Comment: back surgeries  Tobacco Use   Smoking status: Every Day    Current packs/day: 1.00    Average packs/day: 1 pack/day for 45.0 years (45.0 ttl pk-yrs)    Types: Cigarettes   Smokeless tobacco: Never   Tobacco comments:    Uses Nicoderm patch discussed 1-800-quit-now  Vaping Use   Vaping status: Never Used  Substance and Sexual Activity   Alcohol use: No    Alcohol/week: 0.0 standard drinks of alcohol   Drug use: Not Currently    Types: Marijuana    Comment: 01/31/2016 "none since ~ 1980"   Sexual activity: Not Currently    Partners: Male    Birth control/protection: Surgical  Other Topics Concern   Not on file  Social History Narrative   Lives alone.  One daughter is local, but is getting ready to move to [place], where her children live with their father.  The other daughter lives near [place], [place].   Social Determinants of Health   Financial Resource Strain: Low Risk  (07/18/2023)   Overall Financial Resource Strain (CARDIA)    Difficulty of Paying Living Expenses: Not hard at all  Food Insecurity: No Food Insecurity (07/18/2023)   Hunger Vital Sign    Worried About Running Out of Food in the Last Year: Never true    Ran Out of Food in the Last Year: Never true  Transportation Needs: No Transportation Needs (07/18/2023)   PRAPARE - Administrator, Civil Service (Medical): No    Lack of Transportation (Non-Medical): No  Physical Activity: Inactive (07/18/2023)    Exercise Vital Sign    Days of Exercise per Week: 0 days    Minutes of Exercise per Session: 0 min  Stress: Stress Concern Present (07/18/2023)   [place]  [employer] Health - Occupational Stress Questionnaire    Feeling of Stress : To some extent  Social Connections: Socially Isolated (07/18/2023)   Social Connection and Isolation Panel [NHANES]    Frequency of Communication with Friends and Family: More than three times a week    Frequency of Social Gatherings with Friends and Family: Once a week    Attends Religious Services: Never    Database administrator or Organizations: No    Attends Engineer, structural: Never    Marital Status: Divorced    Tobacco Counseling Ready to quit: Not Answered Counseling given: Not Answered Tobacco comments: Uses Nicoderm patch discussed 1-800-quit-now   Clinical Intake:  Pre-visit preparation completed: Yes  Pain : 0-10 Pain Score: 3  Pain Type: Acute pain Pain Location: Bladder (vagina pain, rectal pain after using bathroom, back pain) Pain Onset: More than a month ago Pain Frequency: Constant Pain Relieving Factors: Hydrocodone for back pain  Pain Relieving Factors: Hydrocodone for back pain  BMI - recorded: 26.1 Nutritional Risks: None Diabetes: Yes CBG done?: No Did pt. bring in CBG monitor from home?: Yes Glucose Meter Downloaded?: No  How often do you need to have someone help you when you read instructions, pamphlets, or other written materials from your doctor or pharmacy?: 1 - Never  Interpreter Needed?: No  Information entered by :: Catrell Morrone, RMA   Activities of Daily Living    07/18/2023   10:39 AM 02/13/2023   12:15 PM  In your present state of health, do you have any difficulty performing the following activities:  Hearing? 0   Vision? 0   Difficulty concentrating or making decisions? 0   Walking or climbing stairs? 0   Dressing or bathing? 0   Doing errands, shopping? 1 0   Comment daughter takes her, Estate agent and eating ? N   Using the Toilet? N   In the past six months, have you accidently leaked urine? N   Do you have problems with loss of bowel control? N   Managing your Medications? N   Managing your Finances? N   Housekeeping or managing your Housekeeping? N     Patient Care Team: Pincus Sanes, MD as PCP - General (Internal Medicine) Maisie Fus, MD as PCP - Cardiology (Cardiology) Szabat, Vinnie Level, [facility] (Inactive) as Pharmacist (Pharmacist)  Indicate any recent Medical Services you may have received from other than [facility] providers in the past year (date may be approximate).     Assessment:   This is a routine wellness examination for Cassady.  Hearing/Vision screen Hearing Screening - Comments:: Denies hearing difficulties   Vision Screening - Comments:: Wears eyeglasses   Goals Addressed   None   Depression Screen    07/18/2023   10:56 AM 12/18/2022    2:46 PM 12/18/2022    2:45 PM 09/11/2022    1:46 PM 07/15/2022    3:53 PM 01/14/2022   10:51 AM 08/24/2020   10:11 AM  PHQ 2/9 Scores  PHQ - 2 Score 0 0 0 0 2 1 0  PHQ- 9 Score 0 4  3 4 3      Fall Risk    07/18/2023   10:48 AM 12/18/2022    2:45 PM 09/11/2022    1:46 PM 07/30/2022    1:08 PM 07/15/2022    4:08 PM  Fall Risk   Falls in the past year? 1 0 0 1 1  Number falls  in past yr: 0 0 0 0 1  Injury with Fall? 0 0 0 1 1  Risk for fall due to : No Fall Risks No Fall Risks No Fall Risks No Fall Risks History of fall(s);Impaired balance/gait;Impaired mobility;Impaired vision;Medication side effect  Follow up Falls evaluation completed;Falls prevention discussed Falls evaluation completed Falls evaluation completed Falls evaluation completed Falls evaluation completed;Education provided    MEDICARE RISK AT HOME: Medicare Risk at Home Any stairs in or around the home?: No Home free of loose throw rugs in walkways, pet beds, electrical cords, etc?: Yes Adequate  lighting in your home to reduce risk of falls?: Yes Life alert?: No Use of a cane, walker or w/c?: No Grab bars in the bathroom?: Yes Shower chair or bench in shower?: Yes Elevated toilet seat or a handicapped toilet?: Yes  TIMED UP AND GO:  Was the test performed?  No    Cognitive Function:        07/18/2023   10:51 AM 07/15/2022    4:15 PM  6CIT Screen  What Year? 0 points 0 points  What month? 0 points 0 points  What time? 0 points 0 points  Count back from 20 0 points 0 points  Months in reverse 0 points 0 points  Repeat phrase 0 points 0 points  Total Score 0 points 0 points    Immunizations Immunization History  Administered Date(s) Administered   Fluad Quad(high Dose 65+) 07/30/2022   Influenza, Seasonal, Injecte, Preservative Fre 08/05/2012   Influenza,inj,Quad PF,6+ Mos 05/05/2014, 05/22/2015, 07/29/2016, 06/04/2018   Influenza-Unspecified 07/16/2017, 07/30/2020   Moderna Sars-Covid-2 Vaccination 01/13/2020, 07/30/2020   PNEUMOCOCCAL CONJUGATE-20 07/30/2022   Pneumococcal Conjugate-13 12/02/2017   Tdap 07/14/2013    TDAP status: Due, Education has been provided regarding the importance of this vaccine. Advised may receive this vaccine at local pharmacy or Health Dept. Aware to provide a copy of the vaccination record if obtained from local pharmacy or Health Dept. Verbalized acceptance and understanding.  Flu Vaccine status: Due, Education has been provided regarding the importance of this vaccine. Advised may receive this vaccine at local pharmacy or Health Dept. Aware to provide a copy of the vaccination record if obtained from local pharmacy or Health Dept. Verbalized acceptance and understanding.  Pneumococcal vaccine status: Up to date  Covid-19 vaccine status: Information provided on how to obtain vaccines.   Qualifies for Shingles Vaccine? Yes   Zostavax completed No   Shingrix Completed?: No.    Education has been provided regarding the importance  of this vaccine. Patient has been advised to call insurance company to determine out of pocket expense if they have not yet received this vaccine. Advised may also receive vaccine at local pharmacy or Health Dept. Verbalized acceptance and understanding.  Screening Tests Health Maintenance  Topic Date Due   MAMMOGRAM  Never done   OPHTHALMOLOGY EXAM  12/10/2018   Colonoscopy  11/23/2019   COVID-19 Vaccine (3 - Moderna risk series) 08/27/2020   INFLUENZA VACCINE  04/03/2023   FOOT EXAM  06/12/2023   DTaP/Tdap/Td (2 - Td or Tdap) 07/15/2023   Lung Cancer Screening  07/31/2023 (Originally 06/23/2022)   Zoster Vaccines- Shingrix (1 of 2) 10/18/2023 (Originally 02/05/1975)   HEMOGLOBIN A1C  11/25/2023   Diabetic kidney evaluation - eGFR measurement  05/27/2024   Diabetic kidney evaluation - Urine ACR  05/27/2024   Medicare Annual Wellness (AWV)  07/17/2024   Pneumonia Vaccine 63+ Years old  Completed   DEXA SCAN  Completed  Hepatitis C Screening  Completed   HPV VACCINES  Aged Out    Health Maintenance  Health Maintenance Due  Topic Date Due   MAMMOGRAM  Never done   OPHTHALMOLOGY EXAM  12/10/2018   Colonoscopy  11/23/2019   COVID-19 Vaccine (3 - Moderna risk series) 08/27/2020   INFLUENZA VACCINE  04/03/2023   FOOT EXAM  06/12/2023   DTaP/Tdap/Td (2 - Td or Tdap) 07/15/2023    Lung Cancer Screening: (Low Dose CT Chest recommended if Age 53-80 years, 20 pack-year currently smoking OR have quit w/in 15years.) does qualify.   Lung Cancer Screening Referral: N/A  Additional Screening:  Hepatitis C Screening: does qualify; Completed 03/03/2017  Vision Screening: Recommended annual ophthalmology exams for early detection of glaucoma and other disorders of the eye. Is the patient up to date with their annual eye exam?  {YES/NO:21197} Who is the provider or what is the name of the office in which the patient attends annual eye exams? *** If pt is not established with a provider,  would they like to be referred to a provider to establish care? {YES/NO:21197}.   Dental Screening: Recommended annual dental exams for proper oral hygiene  Diabetic Foot Exam: {Diabetic Foot Exam:2101802}  Community Resource Referral / Chronic Care Management: CRR required this visit?  {YES/NO:21197}  CCM required this visit?  {CCM Required [phone]}     Plan:     I have personally reviewed and noted the following in the patient's chart:   Medical and social history Use of alcohol, tobacco or illicit drugs  Current medications and supplements including opioid prescriptions. Patient is currently taking opioid prescriptions. Information provided to patient regarding non-opioid alternatives. Patient advised to discuss non-opioid treatment plan with their provider. Functional ability and status Nutritional status Physical activity Advanced directives List of other physicians Hospitalizations, surgeries, and ER visits in previous 12 months Vitals Screenings to include cognitive, depression, and falls Referrals and appointments  In addition, I have reviewed and discussed with patient certain preventive protocols, quality metrics, and best practice recommendations. A written personalized care plan for preventive services as well as general preventive health recommendations were provided to patient.     Sylvia Helms L Rilley Stash, CMA   07/18/2023   After Visit Summary: (MyChart) Due to this being a telephonic visit, the after visit summary with patients personalized plan was offered to patient via MyChart   Nurse Notes: Patient is due for a Tdap, Flu

## 2023-07-18 NOTE — Patient Instructions (Incomplete)
Jill Phillips , Thank you for taking time to come for your Medicare Wellness Visit. I appreciate your ongoing commitment to your health goals. Please review the following plan we discussed and let me know if I can assist you in the future.   Referrals/Orders/Follow-Ups/Clinician Recommendations: You are due for a Tetanus vaccine, a Flu vaccine and a Covid vaccine.  It was nice speaking to you today.  Aim for 30 minutes of exercise or brisk walking, 6-8 glasses of water, and 5 servings of fruits and vegetables each day.   You have an order for:   [x]   3D Mammogram     Please call for appointment:  [facility] [address] [place], [place] [address] [phone]     Make sure to wear two-piece clothing.  No lotions, powders, or deodorants the day of the appointment. Make sure to bring picture ID and insurance card.  Bring list of medications you are currently taking including any supplements.   Schedule your  screening mammogram through MyChart!   Log into your MyChart account.  Go to 'Visit' (or 'Appointments' if on mobile App) --> Schedule an Appointment  Under 'Select a Reason for Visit' choose the Mammogram Screening option.  Complete the pre-visit questions and select the time and place that best fits your schedule.    This is a list of the screening recommended for you and due dates:  Health Maintenance  Topic Date Due   Zoster (Shingles) Vaccine (1 of 2) Never done   Mammogram  Never done   Eye exam for diabetics  12/10/2018   Colon Cancer Screening  11/23/2019   COVID-19 Vaccine (3 - Moderna risk series) 08/27/2020   Flu Shot  04/03/2023   Complete foot exam   06/12/2023   DTaP/Tdap/Td vaccine (2 - Td or Tdap) 07/15/2023   Screening for Lung Cancer  07/31/2023*   Hemoglobin A1C  11/25/2023   Yearly kidney function blood test for diabetes  05/27/2024   Yearly kidney health urinalysis for diabetes  05/27/2024   Medicare Annual  Wellness Visit  07/17/2024   Pneumonia Vaccine  Completed   DEXA scan (bone density measurement)  Completed   Hepatitis C Screening  Completed   HPV Vaccine  Aged Out  *Topic was postponed. The date shown is not the original due date.    Advanced directives: (Copy Requested) Please bring a copy of your health care power of attorney and living will to the office to be added to your chart at your convenience.  Next Medicare Annual Wellness Visit scheduled for next year: Yes  Managing Pain Without Opioids Opioids are strong medicines used to treat moderate to severe pain. For some people, especially those who have long-term (chronic) pain, opioids may not be the best choice for pain management due to: Side effects like nausea, constipation, and sleepiness. The risk of addiction (opioid use disorder). The longer you take opioids, the greater your risk of addiction. Pain that lasts for more than 3 months is called chronic pain. Managing chronic pain usually requires more than one approach and is often provided by a team of health care providers working together (multidisciplinary approach). Pain management may be done at a pain management center or pain clinic. How to manage pain without the use of opioids Use non-opioid medicines Non-opioid medicines for pain may include: Over-the-counter or prescription non-steroidal anti-inflammatory drugs (NSAIDs). These may be the first medicines used for pain. They work well for muscle and bone pain,  and they reduce swelling. Acetaminophen. This over-the-counter medicine may work well for milder pain but not swelling. Antidepressants. These may be used to treat chronic pain. A certain type of antidepressant (tricyclics) is often used. These medicines are given in lower doses for pain than when used for depression. Anticonvulsants. These are usually used to treat seizures but may also reduce nerve (neuropathic) pain. Muscle relaxants. These relieve pain  caused by sudden muscle tightening (spasms). You may also use a pain medicine that is applied to the skin as a patch, cream, or gel (topical analgesic), such as a numbing medicine. These may cause fewer side effects than medicines taken by mouth. Do certain therapies as directed Some therapies can help with pain management. They include: Physical therapy. You will do exercises to gain strength and flexibility. A physical therapist may teach you exercises to move and stretch parts of your body that are weak, stiff, or painful. You can learn these exercises at physical therapy visits and practice them at home. Physical therapy may also involve: Massage. Heat wraps or applying heat or cold to affected areas. Electrical signals that interrupt pain signals (transcutaneous electrical nerve stimulation, TENS). Weak lasers that reduce pain and swelling (low-level laser therapy). Signals from your body that help you learn to regulate pain (biofeedback). Occupational therapy. This helps you to learn ways to function at home and work with less pain. Recreational therapy. This involves trying new activities or hobbies, such as a physical activity or drawing. Mental health therapy, including: Cognitive behavioral therapy (CBT). This helps you learn coping skills for dealing with pain. Acceptance and commitment therapy (ACT) to change the way you think and react to pain. Relaxation therapies, including muscle relaxation exercises and mindfulness-based stress reduction. Pain management counseling. This may be individual, family, or group counseling.  Receive medical treatments Medical treatments for pain management include: Nerve block injections. These may include a pain blocker and anti-inflammatory medicines. You may have injections: Near the spine to relieve chronic back or neck pain. Into joints to relieve back or joint pain. Into nerve areas that supply a painful area to relieve body pain. Into  muscles (trigger point injections) to relieve some painful muscle conditions. A medical device placed near your spine to help block pain signals and relieve nerve pain or chronic back pain (spinal cord stimulation device). Acupuncture. Follow these instructions at home Medicines Take over-the-counter and prescription medicines only as told by your health care provider. If you are taking pain medicine, ask your health care providers about possible side effects to watch out for. Do not drive or use heavy machinery while taking prescription opioid pain medicine. Lifestyle  Do not use drugs or alcohol to reduce pain. If you drink alcohol, limit how much you have to: 0-1 drink a day for women who are not pregnant. 0-2 drinks a day for men. Know how much alcohol is in a drink. In the [place] one drink equals one 12 oz bottle of beer (355 mL), one 5 oz glass of wine (148 mL), or one 1 oz glass of hard liquor (44 mL). Do not use any products that contain nicotine or tobacco. These products include cigarettes, chewing tobacco, and vaping devices, such as e-cigarettes. If you need help quitting, ask your health care provider. Eat a healthy diet and maintain a healthy weight. Poor diet and excess weight may make pain worse. Eat foods that are high in fiber. These include fresh fruits and vegetables, whole grains, and beans. Limit foods that  are high in fat and processed sugars, such as fried and sweet foods. Exercise regularly. Exercise lowers stress and may help relieve pain. Ask your health care provider what activities and exercises are safe for you. If your health care provider approves, join an exercise class that combines movement and stress reduction. Examples include yoga and tai chi. Get enough sleep. Lack of sleep may make pain worse. Lower stress as much as possible. Practice stress reduction techniques as told by your therapist. General instructions Work with all your pain management  providers to find the treatments that work best for you. You are an important member of your pain management team. There are many things you can do to reduce pain on your own. Consider joining an online or in-person support group for people who have chronic pain. Keep all follow-up visits. This is important. Where to find more information You can find more information about managing pain without opioids from: American Academy of Pain Medicine: painmed.org Institute for Chronic Pain: instituteforchronicpain.org American Chronic Pain Association: theacpa.org Contact a health care provider if: You have side effects from pain medicine. Your pain gets worse or does not get better with treatments or home therapy. You are struggling with anxiety or depression. Summary Many types of pain can be managed without opioids. Chronic pain may respond better to pain management without opioids. Pain is best managed when you and a team of health care providers work together. Pain management without opioids may include non-opioid medicines, medical treatments, physical therapy, mental health therapy, and lifestyle changes. Tell your health care providers if your pain gets worse or is not being managed well enough. This information is not intended to replace advice given to you by your health care provider. Make sure you discuss any questions you have with your health care provider. Document Revised: 11/29/2020 Document Reviewed: 11/29/2020 Elsevier Patient Education  2024 [employer].

## 2023-07-21 ENCOUNTER — Other Ambulatory Visit: Payer: Self-pay | Admitting: Internal Medicine

## 2023-07-22 DIAGNOSIS — G2581 Restless legs syndrome: Secondary | ICD-10-CM | POA: Diagnosis not present

## 2023-07-22 DIAGNOSIS — Z79899 Other long term (current) drug therapy: Secondary | ICD-10-CM | POA: Diagnosis not present

## 2023-07-22 DIAGNOSIS — M545 Low back pain, unspecified: Secondary | ICD-10-CM | POA: Diagnosis not present

## 2023-07-22 DIAGNOSIS — R030 Elevated blood-pressure reading, without diagnosis of hypertension: Secondary | ICD-10-CM | POA: Diagnosis not present

## 2023-07-24 DIAGNOSIS — Z79899 Other long term (current) drug therapy: Secondary | ICD-10-CM | POA: Diagnosis not present

## 2023-07-25 ENCOUNTER — Other Ambulatory Visit: Payer: Self-pay | Admitting: Internal Medicine

## 2023-07-25 DIAGNOSIS — G8929 Other chronic pain: Secondary | ICD-10-CM

## 2023-07-27 DIAGNOSIS — J449 Chronic obstructive pulmonary disease, unspecified: Secondary | ICD-10-CM | POA: Diagnosis not present

## 2023-07-27 DIAGNOSIS — R0602 Shortness of breath: Secondary | ICD-10-CM | POA: Diagnosis not present

## 2023-08-04 ENCOUNTER — Other Ambulatory Visit: Payer: Self-pay | Admitting: Internal Medicine

## 2023-08-05 ENCOUNTER — Other Ambulatory Visit: Payer: Self-pay | Admitting: Internal Medicine

## 2023-08-15 ENCOUNTER — Other Ambulatory Visit: Payer: Self-pay | Admitting: Internal Medicine

## 2023-08-18 ENCOUNTER — Other Ambulatory Visit: Payer: Self-pay | Admitting: Internal Medicine

## 2023-08-19 DIAGNOSIS — R030 Elevated blood-pressure reading, without diagnosis of hypertension: Secondary | ICD-10-CM | POA: Diagnosis not present

## 2023-08-19 DIAGNOSIS — Z23 Encounter for immunization: Secondary | ICD-10-CM | POA: Diagnosis not present

## 2023-08-19 DIAGNOSIS — Z79899 Other long term (current) drug therapy: Secondary | ICD-10-CM | POA: Diagnosis not present

## 2023-08-19 DIAGNOSIS — G2581 Restless legs syndrome: Secondary | ICD-10-CM | POA: Diagnosis not present

## 2023-08-19 DIAGNOSIS — M545 Low back pain, unspecified: Secondary | ICD-10-CM | POA: Diagnosis not present

## 2023-08-20 ENCOUNTER — Other Ambulatory Visit: Payer: Self-pay | Admitting: Internal Medicine

## 2023-08-20 ENCOUNTER — Ambulatory Visit: Payer: 59

## 2023-08-21 DIAGNOSIS — Z79899 Other long term (current) drug therapy: Secondary | ICD-10-CM | POA: Diagnosis not present

## 2023-08-25 ENCOUNTER — Other Ambulatory Visit: Payer: Self-pay | Admitting: Internal Medicine

## 2023-08-26 DIAGNOSIS — J449 Chronic obstructive pulmonary disease, unspecified: Secondary | ICD-10-CM | POA: Diagnosis not present

## 2023-08-26 DIAGNOSIS — R0602 Shortness of breath: Secondary | ICD-10-CM | POA: Diagnosis not present

## 2023-09-09 ENCOUNTER — Other Ambulatory Visit: Payer: Self-pay | Admitting: Internal Medicine

## 2023-09-18 ENCOUNTER — Telehealth: Payer: 59 | Admitting: Family Medicine

## 2023-09-18 NOTE — Progress Notes (Signed)
MyChart Video Visit    Virtual Visit via Video Note      Subjective:    Patient ID: Jill Phillips, female    DOB: 1956-06-04, 68 y.o.   MRN: [id number]  No chief complaint on file.   HPI  Patient was called multiple times and a mychart message was also sent without response. Visit was not done.    Past Medical History:  Diagnosis Date   Active smoker    Anxiety    Calcifying tendinitis of shoulder    Chronic back pain    Chronic kidney disease 2022   related to potential overdose   Chronic pain syndrome    COPD (chronic obstructive pulmonary disease) (HCC)    Dyspnea    with exertion   Dysthymic disorder    Emphysema lung (HCC)    GERD (gastroesophageal reflux disease)    Heart murmur    for years, nothing to be concerned about   Herpes genitalia    History of blood transfusion 09/02/1978   related to "back surgery"   Hyperlipidemia    Hypertension    Hypothyroidism    Lumbago    Osteoarthrosis, unspecified whether generalized or localized, lower leg    Pain in joint, upper arm    Pneumothorax, left 01/31/2016   S/P Left posterior subcostal pain injection on 01/30/2016   PONV (postoperative nausea and vomiting)    gets nauseous  with longer surgery. Difficuty voiding after surgery   Postlaminectomy syndrome, thoracic region    Pre-diabetes    Primary localized osteoarthrosis, lower leg    Restless leg syndrome    Sleep apnea    s/p surgery- last sleep study 2011- doesnt use oxygen or machine at night as instructed,.   12/2014- Dr Craige Cotta  reports it is negative.   Thyroid disease     Past Surgical History:  Procedure Laterality Date   APPENDECTOMY     BACK SURGERY     18 back surgeries (2 thoracic & 16 lumbar) (01/31/2016)   DILATION AND CURETTAGE OF UTERUS     HAMMER TOE SURGERY     IR RADIOLOGIST EVAL & MGMT  07/21/2017   JOINT REPLACEMENT     KNEE ARTHROSCOPY Right    LAPAROSCOPIC CHOLECYSTECTOMY     LUMBAR FUSION N/A 08/01/2015   Procedure:  Right sided L1-2 and L2-3 transforaminal lumbar interbody fusion with cages, Extension of posterior fusion T12 to L3, Replaced pedicle screws bilaterally L1-L2 , Replaced left sided pedicle screws L-3. Instrumentation T12 to L3 using local bone graft, Vivigen allograft and cancellous chips;  Surgeon: Kerrin Champagne, MD;  Location: [facility] OR;  Service: Orthopedics;  Laterality: N/A;   LUMBAR LAMINECTOMY/DECOMPRESSION MICRODISCECTOMY N/A 01/28/2014   Procedure: Minimally Invasive Right  L1-2 Microdiscectomy;  Surgeon: Kerrin Champagne, MD;  Location: [facility] OR;  Service: Orthopedics;  Laterality: N/A;   REVERSE SHOULDER ARTHROPLASTY Left 09/09/2019   Procedure: LEFT REVERSE SHOULDER REPLACEMENT;  Surgeon: Cammy Copa, MD;  Location: [facility] OR;  Service: Orthopedics;  Laterality: Left;   TOTAL HIP ARTHROPLASTY Right    TOTAL KNEE ARTHROPLASTY  05/29/2012   Procedure: TOTAL KNEE ARTHROPLASTY;  Surgeon: Kathryne Hitch, MD;  Location: [facility] ORS;  Service: Orthopedics;  Laterality: Right;  Right Total Knee Arthroplasty   TOTAL KNEE ARTHROPLASTY Left 02/11/2023   Procedure: LEFT TOTAL KNEE ARTHROPLASTY;  Surgeon: Kathryne Hitch, MD;  Location: [facility] OR;  Service: Orthopedics;  Laterality: Left;   TUBAL LIGATION  UVULOPALATOPHARYNGOPLASTY     VAGINAL HYSTERECTOMY      Family History  Problem Relation Age of Onset   Kidney disease Mother    Heart disease Father    Heart disease Sister 41       s/p CABG   Hypertension Sister    Anuerysm Brother 13       brain   Heart disease Brother    Colon cancer Neg Hx    Breast cancer Neg Hx    Ovarian cancer Neg Hx     Social History   Socioeconomic History   Marital status: Divorced    Spouse name: n/a   Number of children: 2   Years of education: 12+   Highest education level: Not on file  Occupational History   Occupation: disability    Comment: back surgeries  Tobacco Use   Smoking status: Every Day    Current packs/day: 1.00    Average  packs/day: 1 pack/day for 45.0 years (45.0 ttl pk-yrs)    Types: Cigarettes   Smokeless tobacco: Never   Tobacco comments:    Uses Nicoderm patch discussed 1-800-quit-now  Vaping Use   Vaping status: Never Used  Substance and Sexual Activity   Alcohol use: No    Alcohol/week: 0.0 standard drinks of alcohol   Drug use: Not Currently    Types: Marijuana    Comment: 01/31/2016 "none since ~ 1980"   Sexual activity: Not Currently    Partners: Male    Birth control/protection: Surgical  Other Topics Concern   Not on file  Social History Narrative   Lives alone with 2 cats.  One daughter is local, but is getting ready to move to [place], where her children live with their father.  The other daughter lives near [place], [place].   Social Drivers of Corporate investment banker Strain: Low Risk  (07/18/2023)   Overall Financial Resource Strain (CARDIA)    Difficulty of Paying Living Expenses: Not hard at all  Food Insecurity: No Food Insecurity (07/18/2023)   Hunger Vital Sign    Worried About Running Out of Food in the Last Year: Never true    Ran Out of Food in the Last Year: Never true  Transportation Needs: No Transportation Needs (07/18/2023)   PRAPARE - Administrator, Civil Service (Medical): No    Lack of Transportation (Non-Medical): No  Physical Activity: Inactive (07/18/2023)   Exercise Vital Sign    Days of Exercise per Week: 0 days    Minutes of Exercise per Session: 0 min  Stress: Stress Concern Present (07/18/2023)   [employer] of Occupational Health - Occupational Stress Questionnaire    Feeling of Stress : To some extent  Social Connections: Socially Isolated (07/18/2023)   Social Connection and Isolation Panel [NHANES]    Frequency of Communication with Friends and Family: More than three times a week    Frequency of Social Gatherings with Friends and Family: Once a week    Attends Religious Services: Never    Database administrator or  Organizations: No    Attends Banker Meetings: Never    Marital Status: Divorced  Catering manager Violence: Patient Unable To Answer (07/18/2023)   Humiliation, Afraid, Rape, and Kick questionnaire    Fear of Current or Ex-Partner: Patient unable to answer    Emotionally Abused: Patient unable to answer    Physically Abused: Patient unable to answer    Sexually Abused: Patient unable to answer  Outpatient Medications Prior to Visit  Medication Sig Dispense Refill   acetaminophen (TYLENOL) 500 MG tablet Take 1,000 mg by mouth daily as needed for moderate pain or headache.     albuterol (VENTOLIN HFA) 108 (90 Base) MCG/ACT inhaler INHALE 2 PUFFS INTO THE LUNGS EVERY 6 HOURS AS NEEDED FOR WHEEZING OR SHORTNESS OF BREATH. 8.5 g 0   amLODipine-benazepril (LOTREL) 10-40 MG capsule TAKE 1 CAPSULE BY MOUTH DAILY. 90 capsule 0   atorvastatin (LIPITOR) 20 MG tablet TAKE 1 TABLET BY MOUTH DAILY. 90 tablet 1   Budeson-Glycopyrrol-Formoterol (BREZTRI AEROSPHERE) 160-9-4.8 MCG/ACT AERO Inhale 2 puffs into the lungs 2 (two) times daily. 10.7 g 5   cephALEXin (KEFLEX) 500 MG capsule Take 1 capsule (500 mg total) by mouth 2 (two) times daily. 14 capsule 0   clonazePAM (KLONOPIN) 1 MG tablet TAKE 1 TABLET BY MOUTH AT BEDTIME. 30 tablet 2   docusate sodium (COLACE) 100 MG capsule Take 200 mg by mouth at bedtime.     fentaNYL (DURAGESIC) 25 MCG/HR Place 1 patch onto the skin every 3 (three) days.     gabapentin (NEURONTIN) 400 MG capsule TAKE 1 CAPSULE BY MOUTH 3 TIMES DAILY. 270 capsule 0   hydrochlorothiazide (HYDRODIURIL) 12.5 MG tablet TAKE 1 TABLET (12.5 MG TOTAL) BY MOUTH DAILY. (Patient taking differently: Take 12.5 mg by mouth daily as needed (headache).) 30 tablet 3   HYDROcodone-acetaminophen (NORCO/VICODIN) 5-325 MG tablet Take 1-2 tablets by mouth every 4 (four) hours as needed for moderate pain. 30 tablet 0   levothyroxine (SYNTHROID) 88 MCG tablet TAKE 1 TABLET BY MOUTH DAILY. 90  tablet 1   NARCAN 4 MG/0.1ML LIQD nasal spray kit Place 1 spray into the nose as needed (accidental overdose). 1 each 0   nebivolol (BYSTOLIC) 10 MG tablet TAKE 1 TABLET (10 MG TOTAL) BY MOUTH DAILY. 90 tablet 0   nicotine (NICODERM CQ - DOSED IN MG/24 HOURS) 21 mg/24hr patch Place 1 patch (21 mg total) onto the skin daily. 28 patch 2   omeprazole (PRILOSEC) 40 MG capsule TAKE 1 CAPSULE BY MOUTH 2 TIMES DAILY BEFORE A MEAL. 180 capsule 3   oxyCODONE (OXY IR/ROXICODONE) 5 MG immediate release tablet Take 1-2 tablets (5-10 mg total) by mouth every 4 (four) hours as needed for moderate pain (pain score 4-6). 30 tablet 0   Psyllium (VEGETABLE LAXATIVE PO) Take 1 tablet by mouth daily.     QUEtiapine (SEROQUEL) 200 MG tablet TAKE 1 TABLET (200 MG TOTAL) BY MOUTH AT BEDTIME. 90 tablet 1   rOPINIRole (REQUIP) 1 MG tablet Take 1-2 tablets (1-2 mg total) by mouth 2 (two) times daily as needed (restless leg). 60 tablet 0   solifenacin (VESICARE) 5 MG tablet Take 10 mg by mouth daily.     tiZANidine (ZANAFLEX) 4 MG tablet TAKE 1 TABLET BY MOUTH EVERY 8 HOURS AS NEEDED. 90 tablet 1   valACYclovir (VALTREX) 500 MG tablet TAKE 1 TABLET BY MOUTH DAILY. 90 tablet 1   venlafaxine XR (EFFEXOR-XR) 150 MG 24 hr capsule TAKE 2 CAPSULES BY MOUTH DAILY. 180 capsule 1   No facility-administered medications prior to visit.    Allergies  Allergen Reactions   Flagyl [Metronidazole] Diarrhea   Limonene Itching    Yeast infection in mouth   Sulfa Antibiotics Other (See Comments)    Yeast infection in mouth   Doxycycline     Mouth soreness - reaction vs thrush?   Ibuprofen     Kidney Disease   Amoxicillin  Other (See Comments)    REACTION: Oral yeast infection Has patient had a PCN reaction causing immediate rash, facial/tongue/throat swelling, SOB or lightheadedness with hypotension: No Has patient had a PCN reaction causing severe rash involving mucus membranes or skin necrosis: No Has patient had a PCN reaction  that required hospitalization No Has patient had a PCN reaction occurring within the last 10 years: No If all of the above answers are "NO", then may proceed with Cephalosporin use.    Chlorzoxazone Other (See Comments)    headache   Codeine Other (See Comments)    headache   Darvocet [Propoxyphene N-Acetaminophen] Itching   Dilaudid [Hydromorphone Hcl] Itching   Hydralazine Other (See Comments)    Makes her feel bad   Keflex [Cephalexin] Other (See Comments)    Pt does not recall reaction (maybe yeast infection)   Morphine And Codeine Itching   Nitrofurantoin Monohyd Macro Hives    Reaction to [employer] [Oxycodone-Acetaminophen] Itching    Patient can tolerate Acetaminophen solely   Trazodone And Nefazodone Palpitations    ROS     Objective:    Physical Exam  There were no vitals taken for this visit. Wt Readings from Last 3 Encounters:  07/18/23 145 lb (65.8 kg)  05/28/23 142 lb (64.4 kg)  02/11/23 139 lb (63 kg)       Assessment & Plan:   Problem List Items Addressed This Visit   None   I am having Araly C. Michaels maintain her acetaminophen, docusate sodium, Narcan, solifenacin, albuterol, hydrochlorothiazide, fentaNYL, Psyllium (VEGETABLE LAXATIVE PO), oxyCODONE, rOPINIRole, HYDROcodone-acetaminophen, nicotine, Breztri Aerosphere, cephALEXin, amLODipine-benazepril, gabapentin, QUEtiapine, tiZANidine, valACYclovir, omeprazole, venlafaxine XR, clonazePAM, atorvastatin, nebivolol, and levothyroxine.  No orders of the defined types were placed in this encounter.   I discussed the assessment and treatment plan with the patient. The patient was provided an opportunity to ask questions and all were answered. The patient agreed with the plan and demonstrated an understanding of the instructions.   The patient was advised to call back or seek an in-person evaluation if the symptoms worsen or if the condition fails to improve as anticipated.     Hetty Blend, NP-C [facility] [place] [phone] (phone) [phone] (fax)  [facility]

## 2023-09-19 ENCOUNTER — Other Ambulatory Visit: Payer: Self-pay | Admitting: Internal Medicine

## 2023-09-19 DIAGNOSIS — G8929 Other chronic pain: Secondary | ICD-10-CM

## 2023-09-22 ENCOUNTER — Other Ambulatory Visit: Payer: Self-pay | Admitting: Internal Medicine

## 2023-10-03 ENCOUNTER — Ambulatory Visit: Payer: Self-pay | Admitting: Internal Medicine

## 2023-10-03 NOTE — Telephone Encounter (Signed)
Copied from CRM [id number]. Topic: Clinical - Red Word Triage >> Oct 03, 2023 10:38 AM Pascal Lux wrote: Red Word that prompted transfer to Nurse Triage: Patient called and stated she's been having a headache for about 6 days. Headache pain is about 8/10. Has been taking Tylenol and it's not helping.  She did check her blood pressure readings today - 181/93 -231/95 - 217/89. Patient has been taking  nebivolol (BYSTOLIC) 10 MG tablet [[id number]] - and wants to know if she can take 20MG  to help her pressure come down?  Chief Complaint: high blood pressure Symptoms: BP 231/95 left , 217/89 right arm, headache 8/10, n/v, chest congestion  Frequency: x 6 days Pertinent Negatives: Patient denies fever Disposition: [x] ED /[] Urgent Care (no appt availability in office) / [] Appointment(In office/virtual)/ []  [facility]/ [] Home Care/ [] Refused Recommended Disposition /[] [facility]/ []  Follow-up with PCP Additional Notes: pt stated she has felt bad over the last couple of days: been sick to her stomach.  pt stated she would hang up and call daughter to take her to ER now.  Reason for Disposition  [1] Systolic BP  >= 160 OR Diastolic >= 100 AND [2] cardiac (e.g., breathing difficulty, chest pain) or neurologic symptoms (e.g., new-onset blurred or double vision, unsteady gait)    Pt states BP 231/95 left , 217/89 right arm, headache 8/10, n/v  Answer Assessment - Initial Assessment Questions 1. BLOOD PRESSURE: "What is the blood pressure?" "Did you take at least two measurements 5 minutes apart?"     BP 231/95 left , 217/89 right arm 2. ONSET: "When did you take your blood pressure?"     X 6 days 3. HOW: "How did you take your blood pressure?" (e.g., automatic home BP monitor, visiting nurse)     N/a 4. HISTORY: "Do you have a history of high blood pressure?"     Yes and has not missed any doses 5. MEDICINES: "Are you taking any medicines for blood pressure?" "Have you missed any doses  recently?"     Taking tylenol 6. OTHER SYMPTOMS: "Do you have any symptoms?" (e.g., blurred vision, chest pain, difficulty breathing, headache, weakness)     8/10 headache, n/v,  chest congestion 7. PREGNANCY: "Is there any chance you are pregnant?" "When was your last menstrual period?"     N/a  Protocols used: Blood Pressure - High-A-AH

## 2023-10-07 ENCOUNTER — Ambulatory Visit: Payer: 59 | Admitting: Internal Medicine

## 2023-10-08 ENCOUNTER — Ambulatory Visit: Payer: Self-pay | Admitting: Internal Medicine

## 2023-10-08 NOTE — Telephone Encounter (Signed)
 Chief Complaint: HTN Symptoms: High blood pressure, headache, wheezing Frequency: two weeks Pertinent Negatives: Patient denies weakness or numbness in arms or legs Disposition: [x] ED /[] Urgent Care (no appt availability in office) / [] Appointment(In office/virtual)/ []  [facility]/ [] Home Care/ [] Refused Recommended Disposition /[] [facility]/ []  Follow-up with PCP Additional Notes: Patient called in stating she has been experiencing high blood pressure and headaches for the last two weeks. Patient checked her BP at very beginning of triage assessment and stated it was 213/75. This RN confirmed that patient is still taking her Lotrel and Nebivolol  daily and patient has confirmed to taking it today. Patient states she has been fighting a chest cold for the past two weeks with chest congestion, intermittent wheezing, and coughing. Patient denies fever, chest pain, runny nose, and states she's been using her inhaler. Patient has known history of COPD and CKD as stated on the phone. This RN advised patient to sit quietly, take a few deep breaths and take her BP again after roughly 10 minutes on the phone. New BP reading was 214/75. This RN asked patient about her triage encounter on Friday reporting high blood pressure and being advised to go to ED. Patient states she called her daughter to take her and daughter encouraged her to lay down and rest and to not go to the ER due to long wait times. This RN advised patient she needs to be seen and evaluated at ED today asap for current blood pressure. Patient stated she would call her daughter to inform her. This RN advised patient if she cannot find a ride, it is best to call 911 for assessment and potential transport, due to urgency of BP reading. In-office appointment also created for Friday for follow up of medication and htn management. This RN emphasized to patient that although appt was created, patient still needs immediate evaluation.  Patient verbalized understanding.    Copied from CRM [id number]. Topic: Clinical - Red Word Triage >> Oct 08, 2023  4:31 PM Evie B wrote: [facility] that prompted transfer to Nurse Triage: pt called with elevated blood pressure, 192/87 pt would like an appt Reason for Disposition  [1] Systolic BP  >= 160 OR Diastolic >= 100 AND [2] cardiac (e.g., breathing difficulty, chest pain) or neurologic symptoms (e.g., new-onset blurred or double vision, unsteady gait)  Answer Assessment - Initial Assessment Questions 1. BLOOD PRESSURE: What is the blood pressure? Did you take at least two measurements 5 minutes apart?     213/75 at beginning of phone call 2. ONSET: When did you take your blood pressure?     Almost two weeks 3. HOW: How did you take your blood pressure? (e.g., automatic home BP monitor, visiting nurse)     Automatic cuff 4. HISTORY: Do you have a history of high blood pressure?     Yes 5. MEDICINES: Are you taking any medicines for blood pressure? Have you missed any doses recently?     Nebivolol  and lotrel taken daily 6. OTHER SYMPTOMS: Do you have any symptoms? (e.g., blurred vision, chest pain, difficulty breathing, headache, weakness)     Headache,  Protocols used: Blood Pressure - High-A-AH

## 2023-10-09 ENCOUNTER — Encounter (HOSPITAL_COMMUNITY): Payer: Self-pay | Admitting: Emergency Medicine

## 2023-10-09 ENCOUNTER — Emergency Department (HOSPITAL_COMMUNITY)
Admission: EM | Admit: 2023-10-09 | Discharge: 2023-10-09 | Discharge status: Against Medical Advice | Payer: 59 | Attending: Student | Admitting: Student

## 2023-10-09 ENCOUNTER — Other Ambulatory Visit: Payer: Self-pay

## 2023-10-09 ENCOUNTER — Emergency Department (HOSPITAL_COMMUNITY): Payer: 59

## 2023-10-09 ENCOUNTER — Encounter: Payer: Self-pay | Admitting: Internal Medicine

## 2023-10-09 DIAGNOSIS — R519 Headache, unspecified: Secondary | ICD-10-CM | POA: Diagnosis present

## 2023-10-09 DIAGNOSIS — J069 Acute upper respiratory infection, unspecified: Secondary | ICD-10-CM | POA: Diagnosis not present

## 2023-10-09 DIAGNOSIS — N189 Chronic kidney disease, unspecified: Secondary | ICD-10-CM | POA: Diagnosis not present

## 2023-10-09 DIAGNOSIS — I130 Hypertensive heart and chronic kidney disease with heart failure and stage 1 through stage 4 chronic kidney disease, or unspecified chronic kidney disease: Secondary | ICD-10-CM | POA: Insufficient documentation

## 2023-10-09 DIAGNOSIS — Z5321 Procedure and treatment not carried out due to patient leaving prior to being seen by health care provider: Secondary | ICD-10-CM | POA: Insufficient documentation

## 2023-10-09 DIAGNOSIS — R0789 Other chest pain: Secondary | ICD-10-CM | POA: Diagnosis not present

## 2023-10-09 DIAGNOSIS — I509 Heart failure, unspecified: Secondary | ICD-10-CM | POA: Diagnosis not present

## 2023-10-09 LAB — BASIC METABOLIC PANEL
Anion gap: 13 (ref 5–15)
BUN: 12 mg/dL (ref 8–23)
CO2: 26 mmol/L (ref 22–32)
Calcium: 9.2 mg/dL (ref 8.9–10.3)
Chloride: 103 mmol/L (ref 98–111)
Creatinine, Ser: 0.95 mg/dL (ref 0.44–1.00)
GFR, Estimated: >60 mL/min (ref >60)
Glucose, Bld: 133 mg/dL — ABNORMAL HIGH (ref 70–99)
Potassium: 3.2 mmol/L — ABNORMAL LOW (ref 3.5–5.1)
Sodium: 142 mmol/L (ref 135–145)

## 2023-10-09 LAB — TROPONIN I (HIGH SENSITIVITY)
Troponin I (High Sensitivity): 7 ng/L (ref <18)
Troponin I (High Sensitivity): 9 ng/L (ref <18)

## 2023-10-09 LAB — CBC
HCT: 44.6 % (ref 36.0–46.0)
Hemoglobin: 14.7 g/dL (ref 12.0–15.0)
MCH: 31.1 pg (ref 26.0–34.0)
MCHC: 33 g/dL (ref 30.0–36.0)
MCV: 94.5 fL (ref 80.0–100.0)
Platelets: 227 10*3/uL (ref 150–400)
RBC: 4.72 MIL/uL (ref 3.87–5.11)
RDW: 13.8 % (ref 11.5–15.5)
WBC: 6.3 10*3/uL (ref 4.0–10.5)
nRBC: 0 % (ref 0.0–0.2)

## 2023-10-09 NOTE — Progress Notes (Signed)
 Subjective:    Patient ID: Jill Phillips, female    DOB: 06/17/1956, 68 y.o.   MRN: [id number]      HPI Jill Phillips is here for No chief complaint on file.    Elevated BP - BP elevated at home  - highest at home 235/?  It has been elevated each time she checks it.    She just started checking it recently-she started to have symptoms suggestive of high blood pressure like headaches but she was sick at that time.  BP has been elevated since then.  She is taking her medication daily  Medications and allergies reviewed with patient and updated if appropriate.  Current Outpatient Medications on File Prior to Visit  Medication Sig Dispense Refill   acetaminophen  (TYLENOL ) 500 MG tablet Take 1,000 mg by mouth daily as needed for moderate pain or headache.     albuterol  (VENTOLIN  HFA) 108 (90 Base) MCG/ACT inhaler INHALE 2 PUFFS INTO THE LUNGS EVERY 6 HOURS AS NEEDED FOR WHEEZING OR SHORTNESS OF BREATH. 8.5 g 0   amLODipine -benazepril  (LOTREL) 10-40 MG capsule TAKE 1 CAPSULE BY MOUTH DAILY. 90 capsule 0   atorvastatin  (LIPITOR) 20 MG tablet TAKE 1 TABLET BY MOUTH DAILY. 90 tablet 1   Budeson-Glycopyrrol-Formoterol (BREZTRI  AEROSPHERE) 160-9-4.8 MCG/ACT AERO Inhale 2 puffs into the lungs 2 (two) times daily. 10.7 g 5   cephALEXin  (KEFLEX ) 500 MG capsule Take 1 capsule (500 mg total) by mouth 2 (two) times daily. 14 capsule 0   clonazePAM  (KLONOPIN ) 1 MG tablet TAKE 1 TABLET BY MOUTH AT BEDTIME. 30 tablet 2   docusate sodium  (COLACE) 100 MG capsule Take 200 mg by mouth at bedtime.     fentaNYL  (DURAGESIC ) 25 MCG/HR Place 1 patch onto the skin every 3 (three) days.     gabapentin  (NEURONTIN ) 400 MG capsule TAKE 1 CAPSULE BY MOUTH 3 TIMES DAILY. 270 capsule 0   hydrochlorothiazide  (HYDRODIURIL ) 12.5 MG tablet TAKE 1 TABLET (12.5 MG TOTAL) BY MOUTH DAILY. (Patient taking differently: Take 12.5 mg by mouth daily as needed (headache).) 30 tablet 3   HYDROcodone -acetaminophen  (NORCO/VICODIN) 5-325 MG  tablet Take 1-2 tablets by mouth every 4 (four) hours as needed for moderate pain. 30 tablet 0   levothyroxine  (SYNTHROID ) 88 MCG tablet TAKE 1 TABLET BY MOUTH DAILY. 90 tablet 1   NARCAN  4 MG/0.1ML LIQD nasal spray kit Place 1 spray into the nose as needed (accidental overdose). 1 each 0   nebivolol  (BYSTOLIC ) 10 MG tablet TAKE 1 TABLET (10 MG TOTAL) BY MOUTH DAILY. 90 tablet 0   nicotine  (NICODERM CQ  - DOSED IN MG/24 HOURS) 21 mg/24hr patch Place 1 patch (21 mg total) onto the skin daily. 28 patch 2   omeprazole  (PRILOSEC) 40 MG capsule TAKE 1 CAPSULE BY MOUTH 2 TIMES DAILY BEFORE A MEAL. 180 capsule 3   oxyCODONE  (OXY IR/ROXICODONE ) 5 MG immediate release tablet Take 1-2 tablets (5-10 mg total) by mouth every 4 (four) hours as needed for moderate pain (pain score 4-6). 30 tablet 0   Psyllium (VEGETABLE LAXATIVE PO) Take 1 tablet by mouth daily.     QUEtiapine  (SEROQUEL ) 200 MG tablet TAKE 1 TABLET (200 MG TOTAL) BY MOUTH AT BEDTIME. 90 tablet 1   rOPINIRole  (REQUIP ) 1 MG tablet Take 1-2 tablets (1-2 mg total) by mouth 2 (two) times daily as needed (restless leg). 60 tablet 0   solifenacin (VESICARE) 5 MG tablet Take 10 mg by mouth daily.     tiZANidine  (ZANAFLEX ) 4  MG tablet TAKE 1 TABLET BY MOUTH EVERY 8 HOURS AS NEEDED. 90 tablet 1   valACYclovir  (VALTREX ) 500 MG tablet TAKE 1 TABLET BY MOUTH DAILY. 90 tablet 1   venlafaxine  XR (EFFEXOR -XR) 150 MG 24 hr capsule TAKE 2 CAPSULES BY MOUTH DAILY. 180 capsule 1   No current facility-administered medications on file prior to visit.    Review of Systems  Respiratory:  Positive for shortness of breath (sometimes).   Cardiovascular:  Positive for chest pain. Negative for palpitations.  Neurological:  Positive for light-headedness (occ) and headaches.       Objective:   Vitals:   10/10/23 1603  BP: (!) 156/80  Pulse: 78  Temp: 98.3 F (36.8 C)  SpO2: 94%   BP Readings from Last 3 Encounters:  10/10/23 (!) 156/80  10/09/23 122/89   05/28/23 104/70   Wt Readings from Last 3 Encounters:  10/10/23 141 lb (64 kg)  10/09/23 138 lb (62.6 kg)  07/18/23 145 lb (65.8 kg)   Body mass index is 25.38 kg/m.    Physical Exam Constitutional:      General: She is not in acute distress.    Appearance: Normal appearance.  HENT:     Head: Normocephalic and atraumatic.  Eyes:     Conjunctiva/sclera: Conjunctivae normal.  Cardiovascular:     Rate and Rhythm: Normal rate and regular rhythm.     Heart sounds: Normal heart sounds.  Pulmonary:     Effort: Pulmonary effort is normal. No respiratory distress.     Breath sounds: Wheezing (Slight occasional expiratory wheeze) present.  Musculoskeletal:     Right lower leg: No edema.     Left lower leg: No edema.  Skin:    General: Skin is warm and dry.     Findings: No rash.  Neurological:     Mental Status: She is alert. Mental status is at baseline.  Psychiatric:        Mood and Affect: Mood normal.            Assessment & Plan:    See Problem List for Assessment and Plan of chronic medical problems.

## 2023-10-09 NOTE — ED Triage Notes (Signed)
 Pt presents from home for elevated BP. At home 230/102. PCP directed her to ER yesterday.   Endorses HA, chest pain (central and L)  H/o CKD, CHF  Recent URI 2 weeks ago

## 2023-10-10 ENCOUNTER — Ambulatory Visit (INDEPENDENT_AMBULATORY_CARE_PROVIDER_SITE_OTHER): Payer: 59 | Admitting: Internal Medicine

## 2023-10-10 VITALS — BP 140/86 | HR 78 | Temp 98.3°F | Ht 62.5 in | Wt 141.0 lb

## 2023-10-10 DIAGNOSIS — I10 Essential (primary) hypertension: Secondary | ICD-10-CM

## 2023-10-10 MED ORDER — NICOTINE 21 MG/24HR TD PT24: 21.0000 mg | MEDICATED_PATCH | Freq: Every day | TRANSDERMAL | 2 refills | Status: DC

## 2023-10-10 MED ORDER — DOXAZOSIN MESYLATE 2 MG PO TABS: 2.0000 mg | ORAL_TABLET | Freq: Every day | ORAL | 5 refills | Status: DC

## 2023-10-10 NOTE — Assessment & Plan Note (Signed)
 Chronic Not ideally controlled 6 months ago BP was very well-controlled so not sure why it is not ideally controlled at this time Continue amlodipine -benazepril  10-40 mg daily, nebivolol  10 mg daily She does take hydrochlorothiazide  on occasion when she has a headache or leg swelling, but in the past this has caused her CKD to worsen so I would like to avoid this on a regular basis Start doxazosin  2 mg daily Has follow-up with the end of next month, but will contact me sooner if her blood pressure is not controlled

## 2023-10-10 NOTE — Patient Instructions (Addendum)
       Medications changes include :   doxazosin   2 mg once a day      Return for follow up as scheduled.

## 2023-10-13 ENCOUNTER — Ambulatory Visit (INDEPENDENT_AMBULATORY_CARE_PROVIDER_SITE_OTHER): Payer: 59 | Admitting: Orthopaedic Surgery

## 2023-10-13 ENCOUNTER — Ambulatory Visit (INDEPENDENT_AMBULATORY_CARE_PROVIDER_SITE_OTHER): Payer: 59

## 2023-10-13 DIAGNOSIS — Z96652 Presence of left artificial knee joint: Secondary | ICD-10-CM

## 2023-10-13 NOTE — Progress Notes (Signed)
 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  She did trip over her dog in September and injured her left knee.  She has been hurting since then.  On exam her left knee shows almost full extension and almost full flexion.  There is some slight swelling but there is no instability on the ligamentous exam.  2 views of the left knee show well-seated cemented total knee arthroplasty with no complicating features.  The AP view also shows her right knee that has a knee replacement.  She says the right knee is doing well.  I gave her reassurance that the knee replacement was still doing well from our standpoint in light of her being tripped by the dog.  We will see her back in 6 months to see how she is doing overall we can have a final AP and lateral of her knee at that visit.

## 2023-11-05 ENCOUNTER — Ambulatory Visit: Payer: Self-pay | Admitting: Internal Medicine

## 2023-11-05 NOTE — Telephone Encounter (Signed)
  Chief Complaint: urinary burning , low abdominal pain hx last UTI 10/23/23 Symptoms: urinary incontinence at hs. Low abdominal pain with urination.  Frequency: 10/23/23 Pertinent Negatives: Patient denies fever no blood in urine no odor reported  Disposition: [] ED /[] Urgent Care (no appt availability in office) / [x] Appointment(In office/virtual)/ []  [facility]/ [] Home Care/ [] Refused Recommended Disposition /[] [facility]/ []  Follow-up with PCP Additional Notes:   No appt available with PCP. Scheduled appt with another provider in office tomorrow. Recommended if sx worsen go to ED.      Copied from CRM [id number]. Topic: Clinical - Red Word Triage >> Nov 05, 2023  2:22 PM Florestine Avers wrote: Red Word that prompted transfer to Nurse Triage: Patient called in stating that she has another UTI. She states that she constantly having them, she has been wetting the bed, painful urination, and pain in her abdomen. Reason for Disposition  > 2 UTI's in last year  Answer Assessment - Initial Assessment Questions 1. SEVERITY: "How bad is the pain?"  (e.g., Scale 1-10; mild, moderate, or severe)   - MILD (1-3): complains slightly about urination hurting   - MODERATE (4-7): interferes with normal activities     - SEVERE (8-10): excruciating, unwilling or unable to urinate because of the pain      No pain but burning 2. FREQUENCY: "How many times have you had painful urination today?"      Na  3. PATTERN: "Is pain present every time you urinate or just sometimes?"      Every urination 4. ONSET: "When did the painful urination start?"      10/23/23 5. FEVER: "Do you have a fever?" If Yes, ask: "What is your temperature, how was it measured, and when did it start?"    no 6. PAST UTI: "Have you had a urine infection before?" If Yes, ask: "When was the last time?" and "What happened that time?"       Yes  7. CAUSE: "What do you think is causing the painful urination?"  (e.g., UTI,  scratch, Herpes sore)     UTI  8. OTHER SYMPTOMS: "Do you have any other symptoms?" (e.g., blood in urine, flank pain, genital sores, urgency, vaginal discharge)     low abdominal pain painful urination .wetting bed  9. PREGNANCY: "Is there any chance you are pregnant?" "When was your last menstrual period?"     na  Protocols used: Urination Pain - Female-A-AH

## 2023-11-06 ENCOUNTER — Ambulatory Visit (INDEPENDENT_AMBULATORY_CARE_PROVIDER_SITE_OTHER): Admitting: Emergency Medicine

## 2023-11-06 VITALS — BP 104/62 | HR 70 | Temp 98.2°F | Ht 62.5 in | Wt 143.4 lb

## 2023-11-06 DIAGNOSIS — R300 Dysuria: Secondary | ICD-10-CM | POA: Diagnosis not present

## 2023-11-06 DIAGNOSIS — N3000 Acute cystitis without hematuria: Secondary | ICD-10-CM

## 2023-11-06 LAB — POC URINALSYSI DIPSTICK (AUTOMATED)
Bilirubin, UA: 1
Blood, UA: NEGATIVE
Glucose, UA: NEGATIVE
Ketones, UA: NEGATIVE
Leukocytes, UA: NEGATIVE
Nitrite, UA: NEGATIVE
Protein, UA: POSITIVE — AB
Spec Grav, UA: >=1.03 — AB (ref 1.010–1.025)
Urobilinogen, UA: 0.2 U/dL
pH, UA: 6 (ref 5.0–8.0)

## 2023-11-06 MED ORDER — FLUCONAZOLE 150 MG PO TABS: 150.0000 mg | ORAL_TABLET | Freq: Once | ORAL | 0 refills | Status: AC

## 2023-11-06 MED ORDER — CIPROFLOXACIN HCL 500 MG PO TABS: 500.0000 mg | ORAL_TABLET | Freq: Two times a day (BID) | ORAL | 0 refills | Status: AC

## 2023-11-06 NOTE — Assessment & Plan Note (Signed)
 Acute and affecting quality of life Persistent symptoms for 2 weeks Urine dip nondiagnostic Will send urine for culture Take the antibiotic as prescribed.  Cipro 500 mg twice a day for 7 days Take tylenol if needed.   Increase your water intake.  Call if no improvement

## 2023-11-06 NOTE — Patient Instructions (Signed)
 Dysuria Dysuria is pain or discomfort when you pee. The pain may be felt in your urethra, which is the part of your body that drains pee (urine) from your bladder. The pain may also be felt near your genitals, groin, or in your lower belly or back. You may have to pee often or have the sudden feeling that you need to pee. This condition can affect anyone, but it's more common in females. It can be caused by: A urinary tract infection (UTI). Kidney stones or bladder stones. Some sexually transmitted infections (STIs). Dehydration. This is when there's not enough water in your body. Irritation and swelling in the vagina. The use of some medicines. The use of some soaps or products with a scent. Follow these instructions at home: Medicines  Take your medicines only as told. Take your antibiotics as told. Do not stop taking them even if you start to feel better. Eating and drinking Drink enough fluid to keep your pee pale yellow. Certain drinks can make the pain worse. Avoid: Drinks with caffeine in them. Tea. Alcohol. In males, alcohol may irritate the prostate. General instructions Watch your condition for any changes, such as color changes in your pee. Pee often. Do not hold your pee for a long time. If you're female, wipe from front to back after you pee or poop. Use each tissue only once when you wipe. Pee after you have sex. If you've had any tests done, it's up to you to get your test results. Ask your health care provider, or the department doing the test, when your results will be ready. Contact a health care provider if: You have a fever or chills. You have pain in your back or sides. You throw up or feel like you may throw up. You have blood in your pee. You're not peeing as often as normal. You feel very weak. Get help right away if: You have very bad pain that doesn't get better with medicine. You're confused. You have a fast heartbeat while resting. This information is  not intended to replace advice given to you by your health care provider. Make sure you discuss any questions you have with your health care provider. Document Revised: 12/24/2022 Document Reviewed: 12/24/2022 Elsevier Patient Education  2024 [employer].

## 2023-11-06 NOTE — Progress Notes (Signed)
 Jill Phillips 68 y.o.   Chief Complaint  Patient presents with   Dysuria    Patient states it started feb 20th and just been dealing with it. she was taking AZO that hasn't helped. She has burning, vaginal pain, pressure and abdominal pain. She did mention the rectal pain but she states she's had that for years and no one could dx it.     HISTORY OF PRESENT ILLNESS: This is a 68 y.o. female complaining of burning on urination for the last 2 weeks Patient has history of recurrent UTIs but also has a history of interstitial cystitis Has been having chronic pelvic/vaginal pain for the last 8 years on and off. Denies fever, nausea or vomiting, or flank pain. Has been taking Azo over-the-counter for symptom relief.  Dysuria  Associated symptoms include frequency. Pertinent negatives include no chills, nausea or vomiting.     Prior to Admission medications   Medication Sig Start Date End Date Taking? Authorizing Provider  acetaminophen (TYLENOL) 500 MG tablet Take 1,000 mg by mouth daily as needed for moderate pain or headache.   Yes [provider]  albuterol (VENTOLIN HFA) 108 (90 Base) MCG/ACT inhaler INHALE 2 PUFFS INTO THE LUNGS EVERY 6 HOURS AS NEEDED FOR WHEEZING OR SHORTNESS OF BREATH. 11/19/21  Yes Burns, Bobette Mo, MD  amLODipine-benazepril (LOTREL) 10-40 MG capsule TAKE 1 CAPSULE BY MOUTH DAILY. 09/23/23  Yes Burns, Bobette Mo, MD  atorvastatin (LIPITOR) 20 MG tablet TAKE 1 TABLET BY MOUTH DAILY. 08/20/23  Yes Burns, Bobette Mo, MD  Budeson-Glycopyrrol-Formoterol (BREZTRI AEROSPHERE) 160-9-4.8 MCG/ACT AERO Inhale 2 puffs into the lungs 2 (two) times daily. 05/28/23  Yes Burns, Bobette Mo, MD  clonazePAM (KLONOPIN) 1 MG tablet TAKE 1 TABLET BY MOUTH AT BEDTIME. 08/19/23  Yes Burns, Bobette Mo, MD  docusate sodium (COLACE) 100 MG capsule Take 200 mg by mouth at bedtime.   Yes [provider]  doxazosin (CARDURA) 2 MG tablet Take 1 tablet (2 mg total) by mouth daily. 10/10/23  Yes  Burns, Bobette Mo, MD  fentaNYL (DURAGESIC) 25 MCG/HR Place 1 patch onto the skin every 3 (three) days. 07/15/22  Yes [provider]  gabapentin (NEURONTIN) 400 MG capsule TAKE 1 CAPSULE BY MOUTH 3 TIMES DAILY. 06/24/23  Yes Burns, Bobette Mo, MD  hydrochlorothiazide (HYDRODIURIL) 12.5 MG tablet TAKE 1 TABLET (12.5 MG TOTAL) BY MOUTH DAILY. Patient taking differently: Take 12.5 mg by mouth daily as needed (headache). 06/21/22  Yes Burns, Bobette Mo, MD  HYDROcodone-acetaminophen (NORCO/VICODIN) 5-325 MG tablet Take 1-2 tablets by mouth every 4 (four) hours as needed for moderate pain. 02/14/23  Yes Kathryne Hitch, MD  levothyroxine (SYNTHROID) 88 MCG tablet TAKE 1 TABLET BY MOUTH DAILY. 09/09/23  Yes Burns, Bobette Mo, MD  NARCAN 4 MG/0.1ML LIQD nasal spray kit Place 1 spray into the nose as needed (accidental overdose). 03/15/20  Yes Kerrin Champagne, MD  nebivolol (BYSTOLIC) 10 MG tablet TAKE 1 TABLET (10 MG TOTAL) BY MOUTH DAILY. 08/25/23  Yes Burns, Bobette Mo, MD  nicotine (NICODERM CQ - DOSED IN MG/24 HOURS) 21 mg/24hr patch Place 1 patch (21 mg total) onto the skin daily. 10/10/23  Yes Burns, Bobette Mo, MD  omeprazole (PRILOSEC) 40 MG capsule TAKE 1 CAPSULE BY MOUTH 2 TIMES DAILY BEFORE A MEAL. 08/05/23  Yes Burns, Bobette Mo, MD  oxyCODONE (OXY IR/ROXICODONE) 5 MG immediate release tablet Take 1-2 tablets (5-10 mg total) by mouth every 4 (four) hours as needed for moderate pain (  pain score 4-6). 02/13/23  Yes Kathryne Hitch, MD  Psyllium (VEGETABLE LAXATIVE PO) Take 1 tablet by mouth daily.   Yes [provider]  QUEtiapine (SEROQUEL) 200 MG tablet TAKE 1 TABLET (200 MG TOTAL) BY MOUTH AT BEDTIME. 07/22/23  Yes Burns, Bobette Mo, MD  rOPINIRole (REQUIP) 1 MG tablet Take 1-2 tablets (1-2 mg total) by mouth 2 (two) times daily as needed (restless leg). 02/13/23  Yes Kathryne Hitch, MD  solifenacin (VESICARE) 5 MG tablet Take 10 mg by mouth daily.   Yes [provider]   tiZANidine (ZANAFLEX) 4 MG tablet TAKE 1 TABLET BY MOUTH EVERY 8 HOURS AS NEEDED. 09/19/23  Yes Burns, Bobette Mo, MD  valACYclovir (VALTREX) 500 MG tablet TAKE 1 TABLET BY MOUTH DAILY. 08/04/23  Yes Burns, Bobette Mo, MD  venlafaxine XR (EFFEXOR-XR) 150 MG 24 hr capsule TAKE 2 CAPSULES BY MOUTH DAILY. 08/15/23  Yes Burns, Bobette Mo, MD    Allergies  Allergen Reactions   Flagyl [Metronidazole] Diarrhea   Limonene Itching    Yeast infection in mouth   Sulfa Antibiotics Other (See Comments)    Yeast infection in mouth   Doxycycline     Mouth soreness - reaction vs thrush?   Ibuprofen     Kidney Disease   Amoxicillin Other (See Comments)    REACTION: Oral yeast infection Has patient had a PCN reaction causing immediate rash, facial/tongue/throat swelling, SOB or lightheadedness with hypotension: No Has patient had a PCN reaction causing severe rash involving mucus membranes or skin necrosis: No Has patient had a PCN reaction that required hospitalization No Has patient had a PCN reaction occurring within the last 10 years: No If all of the above answers are "NO", then may proceed with Cephalosporin use.    Chlorzoxazone Other (See Comments)    headache   Codeine Other (See Comments)    headache   Darvocet [Propoxyphene N-Acetaminophen] Itching   Dilaudid [Hydromorphone Hcl] Itching   Hydralazine Other (See Comments)    Makes her feel bad   Keflex [Cephalexin] Other (See Comments)    Pt does not recall reaction (maybe yeast infection)   Morphine And Codeine Itching   Nitrofurantoin Monohyd Macro Hives    Reaction to [employer] [Oxycodone-Acetaminophen] Itching    Patient can tolerate Acetaminophen solely   Trazodone And Nefazodone Palpitations    Patient Active Problem List   Diagnosis Date Noted   Status post total left knee replacement 02/11/2023   Urination decrease 12/18/2022   Chronic pelvic pain in female 09/11/2022   Pain of left thumb 09/11/2022   Chest pain  07/30/2022   Onychomycosis 10/04/2021   Itching 07/16/2021   AKI (acute kidney injury) (HCC) 02/04/2021   Acute gout 06/20/2020   Interstitial cystitis 01/21/2020   Acute cystitis 11/08/2019   Status post shoulder surgery 09/09/2019   Nausea 08/19/2019   Anxiety 05/04/2019   Insomnia 05/04/2019   Syncope 12/08/2018   Disorder of rotator cuff syndrome of left shoulder and allied disorder 06/22/2018   Bilateral leg edema 06/04/2018   Type 2 diabetes mellitus without complication, without long-term current use of insulin (HCC) 12/02/2017   Sacral pain 09/17/2017   COPD (chronic obstructive pulmonary disease) (HCC) 04/21/2017   Lower back pain 03/03/2017   CKD (chronic kidney disease) stage 3, GFR 30-59 ml/min (HCC) 08/07/2016   GERD (gastroesophageal reflux disease) 08/07/2016   Chronic respiratory failure (HCC) 07/29/2016   Arthritis of carpometacarpal (CMC) joint of left thumb 07/09/2016  Pneumothorax on left 01/31/2016   Chronic, continuous use of opioids 09/06/2015   Degenerative disc disease, lumbar 08/01/2015    Class: Chronic   Spondylolisthesis of lumbar region 08/01/2015    Class: Chronic   Hypoxia 09/14/2014   Constipation 05/05/2014   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/14/2013   HTN (hypertension) 05/19/2013   HLD (hyperlipidemia) 05/19/2013   Depression 05/19/2013   Hypothyroidism 05/19/2013   Tobacco dependence due to cigarettes    Osteoarthritis of both knees 11/18/2011   RLS (restless legs syndrome) 11/18/2011    Past Medical History:  Diagnosis Date   Active smoker    Anxiety    Calcifying tendinitis of shoulder    Chronic back pain    Chronic kidney disease 2022   related to potential overdose   Chronic pain syndrome    COPD (chronic obstructive pulmonary disease) (HCC)    Dyspnea    with exertion   Dysthymic disorder    Emphysema lung (HCC)    GERD (gastroesophageal reflux disease)    Heart murmur    for years, nothing to be concerned about   Herpes genitalia     History of blood transfusion 09/02/1978   related to "back surgery"   Hyperlipidemia    Hypertension    Hypothyroidism    Lumbago    Osteoarthrosis, unspecified whether generalized or localized, lower leg    Pain in joint, upper arm    Pneumothorax, left 01/31/2016   S/P Left posterior subcostal pain injection on 01/30/2016   PONV (postoperative nausea and vomiting)    gets nauseous  with longer surgery. Difficuty voiding after surgery   Postlaminectomy syndrome, thoracic region    Pre-diabetes    Primary localized osteoarthrosis, lower leg    Restless leg syndrome    Sleep apnea    s/p surgery- last sleep study 2011- doesnt use oxygen or machine at night as instructed,.   12/2014- Dr Craige Cotta  reports it is negative.   Thyroid disease     Past Surgical History:  Procedure Laterality Date   APPENDECTOMY     BACK SURGERY     18 back surgeries (2 thoracic & 16 lumbar) (01/31/2016)   DILATION AND CURETTAGE OF UTERUS     HAMMER TOE SURGERY     IR RADIOLOGIST EVAL & MGMT  07/21/2017   JOINT REPLACEMENT     KNEE ARTHROSCOPY Right    LAPAROSCOPIC CHOLECYSTECTOMY     LUMBAR FUSION N/A 08/01/2015   Procedure: Right sided L1-2 and L2-3 transforaminal lumbar interbody fusion with cages, Extension of posterior fusion T12 to L3, Replaced pedicle screws bilaterally L1-L2 , Replaced left sided pedicle screws L-3. Instrumentation T12 to L3 using local bone graft, Vivigen allograft and cancellous chips;  Surgeon: Kerrin Champagne, MD;  Location: [facility];  Service: Orthopedics;  Laterality: N/A;   LUMBAR LAMINECTOMY/DECOMPRESSION MICRODISCECTOMY N/A 01/28/2014   Procedure: Minimally Invasive Right  L1-2 Microdiscectomy;  Surgeon: Kerrin Champagne, MD;  Location: [facility] OR;  Service: Orthopedics;  Laterality: N/A;   REVERSE SHOULDER ARTHROPLASTY Left 09/09/2019   Procedure: LEFT REVERSE SHOULDER REPLACEMENT;  Surgeon: Cammy Copa, MD;  Location: [facility] OR;  Service: Orthopedics;  Laterality: Left;   TOTAL HIP  ARTHROPLASTY Right    TOTAL KNEE ARTHROPLASTY  05/29/2012   Procedure: TOTAL KNEE ARTHROPLASTY;  Surgeon: Kathryne Hitch, MD;  Location: [facility] ORS;  Service: Orthopedics;  Laterality: Right;  Right Total Knee Arthroplasty   TOTAL KNEE ARTHROPLASTY Left 02/11/2023   Procedure: LEFT TOTAL KNEE ARTHROPLASTY;  Surgeon: Kathryne Hitch, MD;  Location: [facility] OR;  Service: Orthopedics;  Laterality: Left;   TUBAL LIGATION     UVULOPALATOPHARYNGOPLASTY     VAGINAL HYSTERECTOMY      Social History   Socioeconomic History   Marital status: Divorced    Spouse name: n/a   Number of children: 2   Years of education: 12+   Highest education level: GED or equivalent  Occupational History   Occupation: disability    Comment: back surgeries  Tobacco Use   Smoking status: Every Day    Current packs/day: 1.00    Average packs/day: 1 pack/day for 45.0 years (45.0 ttl pk-yrs)    Types: Cigarettes   Smokeless tobacco: Never   Tobacco comments:    Uses Nicoderm patch discussed 1-800-quit-now  Vaping Use   Vaping status: Never Used  Substance and Sexual Activity   Alcohol use: No    Alcohol/week: 0.0 standard drinks of alcohol   Drug use: Not Currently    Types: Marijuana    Comment: 01/31/2016 "none since ~ 1980"   Sexual activity: Not Currently    Partners: Male    Birth control/protection: Surgical  Other Topics Concern   Not on file  Social History Narrative   Lives alone with 2 cats.  One daughter is local, but is getting ready to move to [place], where her children live with their father.  The other daughter lives near [place], [place].   Social Drivers of Corporate investment banker Strain: Low Risk  (11/06/2023)   Overall Financial Resource Strain (CARDIA)    Difficulty of Paying Living Expenses: Not hard at all  Food Insecurity: No Food Insecurity (11/06/2023)   Hunger Vital Sign    Worried About Running Out of Food in the Last Year: Never true    Ran Out of Food in the Last  Year: Never true  Transportation Needs: Unmet Transportation Needs (11/06/2023)   PRAPARE - Transportation    Lack of Transportation (Medical): No    Lack of Transportation (Non-Medical): Yes  Physical Activity: Inactive (11/06/2023)   Exercise Vital Sign    Days of Exercise per Week: 0 days    Minutes of Exercise per Session: 0 min  Stress: No Stress Concern Present (11/06/2023)   [employer] of Occupational Health - Occupational Stress Questionnaire    Feeling of Stress : Not at all  Social Connections: Socially Isolated (11/06/2023)   Social Connection and Isolation Panel [NHANES]    Frequency of Communication with Friends and Family: More than three times a week    Frequency of Social Gatherings with Friends and Family: Once a week    Attends Religious Services: Never    Database administrator or Organizations: No    Attends Banker Meetings: Never    Marital Status: Divorced  Catering manager Violence: Patient Unable To Answer (07/18/2023)   Humiliation, Afraid, Rape, and Kick questionnaire    Fear of Current or Ex-Partner: Patient unable to answer    Emotionally Abused: Patient unable to answer    Physically Abused: Patient unable to answer    Sexually Abused: Patient unable to answer    Family History  Problem Relation Age of Onset   Kidney disease Mother    Heart disease Father    Heart disease Sister 23       s/p CABG   Hypertension Sister    Anuerysm Brother 29       brain   Heart disease Brother  Colon cancer Neg Hx    Breast cancer Neg Hx    Ovarian cancer Neg Hx      Review of Systems  Constitutional: Negative.  Negative for chills and fever.  HENT: Negative.  Negative for congestion and sore throat.   Respiratory: Negative.  Negative for cough and shortness of breath.   Cardiovascular: Negative.  Negative for chest pain and palpitations.  Gastrointestinal:  Negative for abdominal pain, diarrhea, nausea and vomiting.  Genitourinary:   Positive for dysuria and frequency.       Urinary incontinence at nighttime  Skin: Negative.  Negative for rash.  Neurological: Negative.  Negative for dizziness and headaches.    Vitals:   11/06/23 1604  BP: 104/62  Pulse: 70  Temp: 98.2 F (36.8 C)  SpO2: 93%    Physical Exam Vitals reviewed.  Constitutional:      Appearance: Normal appearance.  HENT:     Head: Normocephalic.  Eyes:     Extraocular Movements: Extraocular movements intact.     Pupils: Pupils are equal, round, and reactive to light.  Cardiovascular:     Rate and Rhythm: Normal rate and regular rhythm.  Pulmonary:     Effort: Pulmonary effort is normal.  Abdominal:     Palpations: Abdomen is soft.     Tenderness: There is no abdominal tenderness. There is no right CVA tenderness or left CVA tenderness.     Comments: Positive suprapubic tenderness  Musculoskeletal:     Cervical back: No tenderness.  Lymphadenopathy:     Cervical: No cervical adenopathy.  Skin:    General: Skin is warm and dry.  Neurological:     Mental Status: She is alert and oriented to person, place, and time.  Psychiatric:        Mood and Affect: Mood normal.        Behavior: Behavior normal.    Results for orders placed or performed in visit on 11/06/23 (from the past 24 hours)  POCT Urinalysis Dipstick (Automated)     Status: Abnormal   Collection Time: 11/06/23  4:14 PM  Result Value Ref Range   Color, UA orange    Clarity, UA clear    Glucose, UA Negative Negative   Bilirubin, UA 1    Ketones, UA negative    Spec Grav, UA >=1.030 (A) 1.010 - 1.025   Blood, UA negative    pH, UA 6.0 5.0 - 8.0   Protein, UA Positive (A) Negative   Urobilinogen, UA 0.2 0.2 or 1.0 E.U./dL   Nitrite, UA negative    Leukocytes, UA Negative Negative   *Note: Due to a large number of results and/or encounters for the requested time period, some results have not been displayed. A complete set of results can be found in Results Review.       ASSESSMENT & PLAN: A total of 32 minutes was spent with the patient and counseling/coordination of care regarding preparing for this visit, review of most recent office visit notes, review of multiple chronic medical conditions under management, review of all medications, differential diagnosis of dysuria and possible infection, need for antibiotics, prognosis, pain management, documentation, and need for follow-up if no better or worse during the next several days.  Problem List Items Addressed This Visit       Genitourinary   Acute cystitis - Primary   Acute and affecting quality of life Persistent symptoms for 2 weeks Urine dip nondiagnostic Will send urine for culture Take the antibiotic as  prescribed.  Cipro 500 mg twice a day for 7 days Take tylenol if needed.   Increase your water intake.  Call if no improvement         Other   Dysuria   Most likely secondary to UTI.  However patient has also history of interstitial cystitis Urine sent for culture Pain management discussed May continue over-the-counter Azo. Contact the office/PCP if no better or worse during the next several days      Relevant Medications   fluconazole (DIFLUCAN) 150 MG tablet   ciprofloxacin (CIPRO) 500 MG tablet   Other Relevant Orders   POCT Urinalysis Dipstick (Automated) (Completed)   Urine Culture   Patient Instructions  Dysuria Dysuria is pain or discomfort when you pee. The pain may be felt in your urethra, which is the part of your body that drains pee (urine) from your bladder. The pain may also be felt near your genitals, groin, or in your lower belly or back. You may have to pee often or have the sudden feeling that you need to pee. This condition can affect anyone, but it's more common in females. It can be caused by: A urinary tract infection (UTI). Kidney stones or bladder stones. Some sexually transmitted infections (STIs). Dehydration. This is when there's not enough water  in your body. Irritation and swelling in the vagina. The use of some medicines. The use of some soaps or products with a scent. Follow these instructions at home: Medicines  Take your medicines only as told. Take your antibiotics as told. Do not stop taking them even if you start to feel better. Eating and drinking Drink enough fluid to keep your pee pale yellow. Certain drinks can make the pain worse. Avoid: Drinks with caffeine in them. Tea. Alcohol. In males, alcohol may irritate the prostate. General instructions Watch your condition for any changes, such as color changes in your pee. Pee often. Do not hold your pee for a long time. If you're female, wipe from front to back after you pee or poop. Use each tissue only once when you wipe. Pee after you have sex. If you've had any tests done, it's up to you to get your test results. Ask your health care provider, or the department doing the test, when your results will be ready. Contact a health care provider if: You have a fever or chills. You have pain in your back or sides. You throw up or feel like you may throw up. You have blood in your pee. You're not peeing as often as normal. You feel very weak. Get help right away if: You have very bad pain that doesn't get better with medicine. You're confused. You have a fast heartbeat while resting. This information is not intended to replace advice given to you by your health care provider. Make sure you discuss any questions you have with your health care provider. Document Revised: 12/24/2022 Document Reviewed: 12/24/2022 Elsevier Patient Education  2024 Elsevier Inc.    Edwina Barth, MD [facility]

## 2023-11-06 NOTE — Assessment & Plan Note (Signed)
 Most likely secondary to UTI.  However patient has also history of interstitial cystitis Urine sent for culture Pain management discussed May continue over-the-counter Azo. Contact the office/PCP if no better or worse during the next several days

## 2023-11-07 ENCOUNTER — Other Ambulatory Visit: Payer: Self-pay | Admitting: Internal Medicine

## 2023-11-07 LAB — URINE CULTURE: Result:: NO GROWTH

## 2023-11-09 ENCOUNTER — Encounter: Payer: Self-pay | Admitting: Emergency Medicine

## 2023-11-10 ENCOUNTER — Ambulatory Visit: Payer: Self-pay | Admitting: Internal Medicine

## 2023-11-10 NOTE — Telephone Encounter (Unsigned)
 Copied from CRM [id number]. Topic: Clinical - Red Word Triage >> Nov 10, 2023  2:20 PM Chantha C wrote: Red Word that prompted transfer to Nurse Triage: Patient was seen last week with Dr. Alvy Bimler and was prescribed Cipro. Patient thinks the medication is not agreeing with her and having side effects. Patient states dizziness, cannot get up, weak, headaches, burning sensation in vaginal area won't let up, nausea, and low abdomen pain. Patient denies a fever, swelling, vision issues, nor shortness of breath. Please call advise [phone].  Chief Complaint: near syncopal episode Symptoms: vaginal burning (constnat) low back pain Frequency: *** Pertinent Negatives: Patient denies *** Disposition: [] ED /[] Urgent Care (no appt availability in office) / [] Appointment(In office/virtual)/ []  [facility]/ [] Home Care/ [] Refused Recommended Disposition /[] [facility]/ []  Follow-up with PCP Additional Notes: ***  Reason for Disposition  [1] Fall in systolic BP > 20 mm Hg from normal AND [2] dizzy, lightheaded, or weak  Answer Assessment - Initial Assessment Questions 1. LOCATION: "Where does it hurt?"      Low ab. Vaginal burning- doesn't  2. RADIATION: "Does the pain shoot anywhere else?" (e.g., chest, back)     ? buttock 3. ONSET: "When did the pain begin?" (e.g., minutes, hours or days ago)     Have  5. PATTERN "Does the pain come and go, or is it constant?"    - If it comes and goes: "How long does it last?" "Do you have pain now?"     (Note: Comes and goes means the pain is intermittent. It goes away completely between bouts.)    - If constant: "Is it getting better, staying the same, or getting worse?"      (Note: Constant means the pain never goes away completely; most serious pain is constant and gets worse.)      constant 6. SEVERITY: "How bad is the pain?"  (e.g., Scale 1-10; mild, moderate, or severe)    - MILD (1-3): Doesn't interfere with normal activities,  abdomen soft and not tender to touch.     - MODERATE (4-7): Interferes with normal activities or awakens from sleep, abdomen tender to touch.     - SEVERE (8-10): Excruciating pain, doubled over, unable to do any normal activities.       mild  8. CAUSE: "What do you think is causing the stomach pain?"     Pt unsure  9. RELIEVING/AGGRAVATING FACTORS: "What makes it better or worse?" (e.g., antacids, bending or twisting motion, bowel movement)     *No Answer* 10. OTHER SYMPTOMS: "Do you have any other symptoms?" (e.g., back pain, diarrhea, fever, urination pain, vomiting)       Nausea, dizziness, near syncopal episode  Answer Assessment - Initial Assessment Questions 1. DESCRIPTION: "Describe your dizziness."     Worse with standing. Afraid would pass out  2. LIGHTHEADED: "Do you feel lightheaded?" (e.g., somewhat faint, woozy, weak upon standing)     yes 3. VERTIGO: "Do you feel like either you or the room is spinning or tilting?" (i.e. vertigo)     Unable to answer 4. SEVERITY: "How bad is it?"  "Do you feel like you are going to faint?" "Can you stand and walk?"   - MILD: Feels slightly dizzy, but walking normally.   - MODERATE: Feels unsteady when walking, but not falling; interferes with normal activities (e.g., school, work).   - SEVERE: Unable to walk without falling, or requires assistance to walk without falling; feels like passing out  now.      moderate 5. ONSET:  "When did the dizziness begin?"     Yesterday  6. AGGRAVATING FACTORS: "Does anything make it worse?" (e.g., standing, change in head position)     standing 7. HEART RATE: "Can you tell me your heart rate?" "How many beats in 15 seconds?"  (Note: not all patients can do this)    HR   60  BP: 93/50    recheck:92/53 8. CAUSE: "What do you think is causing the dizziness?"     SE of Cipro 10. OTHER SYMPTOMS: "Do you have any other symptoms?" (e.g., fever, chest pain, vomiting, diarrhea, bleeding)       Woke up with to  buttock, nausea  Answer Assessment - Initial Assessment Questions 1. BLOOD PRESSURE: "What is the blood pressure?" "Did you take at least two measurements 5 minutes apart?"     93/50  92/53 2. ONSET: "When did you take your blood pressure?"     1440 sand 1550 3. HOW: "How did you obtain the blood pressure?" (e.g., visiting nurse, automatic home BP monitor)     Automatic monitor 4. HISTORY: "Do you have a history of low blood pressure?" "What is your blood pressure normally?"     no  6. PULSE RATE: "Do you know what your pulse rate is?"      60 7. OTHER SYMPTOMS: "Have you been sick recently?" "Have you had a recent injury?"     UTI Lightheadedness near syncopal episodes. Weakness  Protocols used: Abdominal Pain - Female-A-AH, Dizziness - Lightheadedness-A-AH, Blood Pressure - Low-A-AH

## 2023-11-11 ENCOUNTER — Telehealth: Admitting: Internal Medicine

## 2023-11-11 ENCOUNTER — Ambulatory Visit: Payer: Self-pay | Admitting: Internal Medicine

## 2023-11-11 ENCOUNTER — Telehealth: Payer: Self-pay

## 2023-11-11 DIAGNOSIS — N3000 Acute cystitis without hematuria: Secondary | ICD-10-CM

## 2023-11-11 DIAGNOSIS — F17210 Nicotine dependence, cigarettes, uncomplicated: Secondary | ICD-10-CM | POA: Diagnosis not present

## 2023-11-11 DIAGNOSIS — B3731 Acute candidiasis of vulva and vagina: Secondary | ICD-10-CM | POA: Diagnosis not present

## 2023-11-11 MED ORDER — FLUCONAZOLE 150 MG PO TABS: ORAL_TABLET | ORAL | 1 refills | Status: DC

## 2023-11-11 MED ORDER — CEPHALEXIN 500 MG PO CAPS: 500.0000 mg | ORAL_CAPSULE | Freq: Three times a day (TID) | ORAL | 0 refills | Status: DC

## 2023-11-11 NOTE — Progress Notes (Unsigned)
 Patient ID: Jill Phillips, female   DOB: [birth year], 68 y.o.   MRN: [id number]  Virtual Visit via Video Note  I connected with Brett Canales on 11/11/23 at  3:20 PM EDT by a video enabled telemedicine application and verified that I am speaking with the correct person using two identifiers.  Location: Patient: *** Provider: ***   I discussed the limitations of evaluation and management by telemedicine and the availability of in person appointments. The patient expressed understanding and agreed to proceed.  History of Present Illness:    Observations/Objective:   Assessment and Plan:   Follow Up Instructions:    I discussed the assessment and treatment plan with the patient. The patient was provided an opportunity to ask questions and all were answered. The patient agreed with the plan and demonstrated an understanding of the instructions.   The patient was advised to call back or seek an in-person evaluation if the symptoms worsen or if the condition fails to improve as anticipated.   Oliver Barre, MD

## 2023-11-11 NOTE — Telephone Encounter (Signed)
 Copied from CRM [id number]. Topic: Clinical - Prescription Issue >> Nov 11, 2023  4:31 PM [place] J wrote:  Reason for CRM: [employer] calling in regarding prescribed medication (Cefalexin). Medication is on patient's allergy list.   Callback: [phone]

## 2023-11-11 NOTE — Telephone Encounter (Signed)
 Chief Complaint: vaginal discomfort Symptoms: discomfort and burning inside vagina Frequency: constant 8/10 Pertinent Negatives: Patient denies rash, fever Disposition: [] ED /[] Urgent Care (no appt availability in office) / [x] Appointment(In office/virtual)/ []  [facility]/ [] Home Care/ [] Refused Recommended Disposition /[] [facility] [facility]/ []  Follow-up with PCP Additional Notes: Patient states that she stopped taking the Cipro after yesterday due to it making her feel dizzy.  Patient still complaining of vaginal discomfort that is constant. It is only inside of the vagina. Denies any other symptoms.   Copied from CRM [id number]. Topic: Clinical - Red Word Triage >> Nov 11, 2023 10:12 AM Marica Otter wrote: [facility] that prompted transfer to Nurse Triage: Patient is still having burning sensation in vaginal area. Reason for Disposition  [1] MILD-MODERATE pain AND [2] present > 24 hours  (Exception: Chronic pain.)  Answer Assessment - Initial Assessment Questions 1. SYMPTOM: "What's the main symptom you're concerned about?" (e.g., pain, itching, dryness)     pain 2. LOCATION: "Where is the pain located?" (e.g., inside/outside, left/right)     Inside the vaginal not just with urination but all the time 3. ONSET: "When did the  pain  start?"     Around Feb 20 4. PAIN: "Is there any pain?" If Yes, ask: "How bad is it?" (Scale: 1-10; mild, moderate, severe)   -  MILD (1-3): Doesn't interfere with normal activities.    -  MODERATE (4-7): Interferes with normal activities (e.g., work or school) or awakens from sleep.     -  SEVERE (8-10): Excruciating pain, unable to do any normal activities.     8- constant 5. ITCHING: "Is there any itching?" If Yes, ask: "How bad is it?" (Scale: 1-10; mild, moderate, severe)     no 6. CAUSE: "What do you think is causing the pain?" "Have you had the same problem before? What happened then?"     uti 7. OTHER SYMPTOMS: "Do you have any other  symptoms?" (e.g., fever, itching, vaginal bleeding, pain with urination, injury to genital area, vaginal foreign body)     no  Protocols used: Vaginal Symptoms-A-AH

## 2023-11-12 NOTE — Telephone Encounter (Signed)
 The "allergy" to keflex was previously an "intolerance" due to yeast infection  Ok to continue with tx with keflex - thanks

## 2023-11-12 NOTE — Telephone Encounter (Signed)
 Called pharmacy today and patient has already picked up script.

## 2023-11-13 ENCOUNTER — Other Ambulatory Visit: Payer: Self-pay | Admitting: Internal Medicine

## 2023-11-13 ENCOUNTER — Encounter: Payer: Self-pay | Admitting: Internal Medicine

## 2023-11-13 DIAGNOSIS — B3731 Acute candidiasis of vulva and vagina: Secondary | ICD-10-CM | POA: Insufficient documentation

## 2023-11-13 NOTE — Assessment & Plan Note (Signed)
Mild to mod, for diflucan 150 mg asd, to f/u any worsening symptoms or concerns

## 2023-11-13 NOTE — Assessment & Plan Note (Signed)
 Pt counsled to quit, pt not ready

## 2023-11-13 NOTE — Patient Instructions (Signed)
 Please take all new medication as prescribed

## 2023-11-13 NOTE — Assessment & Plan Note (Signed)
 Mild to mod, for antibx course cephalexin 500 tid,  to f/u any worsening symptoms or concerns

## 2023-11-14 ENCOUNTER — Other Ambulatory Visit: Payer: Self-pay | Admitting: Internal Medicine

## 2023-11-14 DIAGNOSIS — G8929 Other chronic pain: Secondary | ICD-10-CM

## 2023-11-21 ENCOUNTER — Other Ambulatory Visit: Payer: Self-pay | Admitting: Internal Medicine

## 2023-11-24 ENCOUNTER — Encounter: Payer: Self-pay | Admitting: Internal Medicine

## 2023-11-24 NOTE — Patient Instructions (Addendum)
    Blood work was ordered.       Medications changes include :   None    A referral was ordered and someone will call you to schedule an appointment.     Return in about 6 months (around 05/27/2024) for Physical Exam.

## 2023-11-24 NOTE — Progress Notes (Unsigned)
 Subjective:    Patient ID: Jill Phillips, female    DOB: [birth year], 68 y.o.   MRN: [id number]     HPI Shawndrea is here for follow up of her chronic medical problems.  2 UTI this month - culture on 3/6 was negative for infection.  Symptoms resolved.   Using nicotine patch and working on quitting  Having terrible headaches.   Having indigestion. Feels it into her chest and left side of upper abdomen. Has it a lot.  Taking prilosec daily.  Takes gaviscon - does not help.  Has had this 3-4 weeks weeks.  No burning sensation -- just pain.  Worse when laying in bed.  Only occurs in bed.  Does not feel it when she is up and about.  Having a lot of thoracic back pain.    Medications and allergies reviewed with patient and updated if appropriate.  Current Outpatient Medications on File Prior to Visit  Medication Sig Dispense Refill   acetaminophen (TYLENOL) 500 MG tablet Take 1,000 mg by mouth daily as needed for moderate pain or headache.     amLODipine-benazepril (LOTREL) 10-40 MG capsule TAKE 1 CAPSULE BY MOUTH DAILY. 90 capsule 0   atorvastatin (LIPITOR) 20 MG tablet TAKE 1 TABLET BY MOUTH DAILY. 90 tablet 1   Budeson-Glycopyrrol-Formoterol (BREZTRI AEROSPHERE) 160-9-4.8 MCG/ACT AERO INHALE 2 PUFFS INTO THE LUNGS 2 TIMES DAILY. 10.7 g 5   clonazePAM (KLONOPIN) 1 MG tablet TAKE 1 TABLET BY MOUTH AT BEDTIME. 30 tablet 3   docusate sodium (COLACE) 100 MG capsule Take 200 mg by mouth at bedtime.     fentaNYL (DURAGESIC) 25 MCG/HR Place 1 patch onto the skin every 3 (three) days.     gabapentin (NEURONTIN) 400 MG capsule TAKE 1 CAPSULE BY MOUTH 3 TIMES DAILY. 270 capsule 0   hydrochlorothiazide (HYDRODIURIL) 12.5 MG tablet TAKE 1 TABLET (12.5 MG TOTAL) BY MOUTH DAILY. (Patient taking differently: Take 12.5 mg by mouth daily as needed (headache).) 30 tablet 3   HYDROcodone-acetaminophen (NORCO/VICODIN) 5-325 MG tablet Take 1-2 tablets by mouth every 4 (four) hours as needed for moderate  pain. 30 tablet 0   levothyroxine (SYNTHROID) 88 MCG tablet TAKE 1 TABLET BY MOUTH DAILY. 90 tablet 1   NARCAN 4 MG/0.1ML LIQD nasal spray kit Place 1 spray into the nose as needed (accidental overdose). 1 each 0   nebivolol (BYSTOLIC) 10 MG tablet TAKE 1 TABLET (10 MG TOTAL) BY MOUTH DAILY. 90 tablet 1   nicotine (NICODERM CQ - DOSED IN MG/24 HOURS) 21 mg/24hr patch Place 1 patch (21 mg total) onto the skin daily. 28 patch 2   omeprazole (PRILOSEC) 40 MG capsule TAKE 1 CAPSULE BY MOUTH 2 TIMES DAILY BEFORE A MEAL. 180 capsule 3   Psyllium (VEGETABLE LAXATIVE PO) Take 1 tablet by mouth daily.     QUEtiapine (SEROQUEL) 200 MG tablet TAKE 1 TABLET (200 MG TOTAL) BY MOUTH AT BEDTIME. 90 tablet 1   rOPINIRole (REQUIP) 1 MG tablet Take 1-2 tablets (1-2 mg total) by mouth 2 (two) times daily as needed (restless leg). 60 tablet 0   solifenacin (VESICARE) 5 MG tablet Take 10 mg by mouth daily.     tiZANidine (ZANAFLEX) 4 MG tablet TAKE 1 TABLET BY MOUTH EVERY 8 HOURS AS NEEDED. 90 tablet 2   valACYclovir (VALTREX) 500 MG tablet TAKE 1 TABLET BY MOUTH DAILY. 90 tablet 1   venlafaxine XR (EFFEXOR-XR) 150 MG 24 hr capsule TAKE 2 CAPSULES BY  MOUTH DAILY. 180 capsule 1   doxazosin (CARDURA) 2 MG tablet Take 1 tablet (2 mg total) by mouth daily. 30 tablet 5   No current facility-administered medications on file prior to visit.     Review of Systems  Constitutional:  Negative for fever.  Respiratory:  Negative for cough, shortness of breath and wheezing.   Cardiovascular:  Positive for chest pain (? related to back pain). Negative for palpitations and leg swelling.  Musculoskeletal:  Positive for arthralgias and back pain.  Neurological:  Positive for headaches. Negative for light-headedness.       Objective:   Vitals:   11/25/23 1231  BP: 112/62  Pulse: 78  Temp: 98.2 F (36.8 C)  SpO2: 90%   BP Readings from Last 3 Encounters:  11/25/23 112/62  11/06/23 104/62  10/10/23 (!) 140/86   Wt  Readings from Last 3 Encounters:  11/25/23 145 lb 3.2 oz (65.9 kg)  11/06/23 143 lb 6.4 oz (65 kg)  10/10/23 141 lb (64 kg)   Body mass index is 26.13 kg/m.    Physical Exam Constitutional:      General: She is not in acute distress.    Appearance: Normal appearance.  HENT:     Head: Normocephalic and atraumatic.  Eyes:     Conjunctiva/sclera: Conjunctivae normal.  Cardiovascular:     Rate and Rhythm: Normal rate and regular rhythm.     Heart sounds: Normal heart sounds.  Pulmonary:     Effort: Pulmonary effort is normal. No respiratory distress.     Breath sounds: Normal breath sounds. No wheezing.  Musculoskeletal:     Cervical back: Neck supple.     Right lower leg: No edema.     Left lower leg: No edema.  Lymphadenopathy:     Cervical: No cervical adenopathy.  Skin:    General: Skin is warm and dry.     Findings: No rash.  Neurological:     Mental Status: She is alert. Mental status is at baseline.  Psychiatric:        Mood and Affect: Mood normal.        Behavior: Behavior normal.        Lab Results  Component Value Date   WBC 6.3 10/09/2023   HGB 14.7 10/09/2023   HCT 44.6 10/09/2023   PLT 227 10/09/2023   GLUCOSE 133 (H) 10/09/2023   CHOL 180 05/28/2023   TRIG 236.0 (H) 05/28/2023   HDL 41.20 05/28/2023   LDLDIRECT 110.0 07/16/2021   LDLCALC 92 05/28/2023   ALT 9 05/28/2023   AST 16 05/28/2023   NA 142 10/09/2023   K 3.2 (L) 10/09/2023   CL 103 10/09/2023   CREATININE 0.95 10/09/2023   BUN 12 10/09/2023   CO2 26 10/09/2023   TSH 1.67 05/28/2023   INR 1.1 02/14/2023   HGBA1C 6.3 05/28/2023   MICROALBUR 6.0 (H) 05/28/2023     Assessment & Plan:    See Problem List for Assessment and Plan of chronic medical problems.

## 2023-11-25 ENCOUNTER — Ambulatory Visit (INDEPENDENT_AMBULATORY_CARE_PROVIDER_SITE_OTHER): Payer: 59 | Admitting: Internal Medicine

## 2023-11-25 VITALS — BP 112/62 | HR 78 | Temp 98.2°F | Ht 62.5 in | Wt 145.2 lb

## 2023-11-25 DIAGNOSIS — F419 Anxiety disorder, unspecified: Secondary | ICD-10-CM

## 2023-11-25 DIAGNOSIS — M4316 Spondylolisthesis, lumbar region: Secondary | ICD-10-CM

## 2023-11-25 DIAGNOSIS — N1832 Chronic kidney disease, stage 3b: Secondary | ICD-10-CM | POA: Diagnosis not present

## 2023-11-25 DIAGNOSIS — I10 Essential (primary) hypertension: Secondary | ICD-10-CM | POA: Diagnosis not present

## 2023-11-25 DIAGNOSIS — E038 Other specified hypothyroidism: Secondary | ICD-10-CM

## 2023-11-25 DIAGNOSIS — E7849 Other hyperlipidemia: Secondary | ICD-10-CM | POA: Diagnosis not present

## 2023-11-25 DIAGNOSIS — R32 Unspecified urinary incontinence: Secondary | ICD-10-CM

## 2023-11-25 DIAGNOSIS — G2581 Restless legs syndrome: Secondary | ICD-10-CM

## 2023-11-25 DIAGNOSIS — F3289 Other specified depressive episodes: Secondary | ICD-10-CM

## 2023-11-25 DIAGNOSIS — N3000 Acute cystitis without hematuria: Secondary | ICD-10-CM

## 2023-11-25 DIAGNOSIS — E119 Type 2 diabetes mellitus without complications: Secondary | ICD-10-CM

## 2023-11-25 DIAGNOSIS — J439 Emphysema, unspecified: Secondary | ICD-10-CM

## 2023-11-25 DIAGNOSIS — K219 Gastro-esophageal reflux disease without esophagitis: Secondary | ICD-10-CM

## 2023-11-25 DIAGNOSIS — G4709 Other insomnia: Secondary | ICD-10-CM

## 2023-11-25 LAB — COMPREHENSIVE METABOLIC PANEL
ALT: 10 U/L (ref 0–35)
AST: 18 U/L (ref 0–37)
Albumin: 4.4 g/dL (ref 3.5–5.2)
Alkaline Phosphatase: 139 U/L — ABNORMAL HIGH (ref 39–117)
BUN: 23 mg/dL (ref 6–23)
CO2: 32 meq/L (ref 19–32)
Calcium: 9.6 mg/dL (ref 8.4–10.5)
Chloride: 102 meq/L (ref 96–112)
Creatinine, Ser: 1.17 mg/dL (ref 0.40–1.20)
GFR: 47.85 mL/min — ABNORMAL LOW (ref >60.00)
Glucose, Bld: 103 mg/dL — ABNORMAL HIGH (ref 70–99)
Potassium: 4.5 meq/L (ref 3.5–5.1)
Sodium: 142 meq/L (ref 135–145)
Total Bilirubin: 0.4 mg/dL (ref 0.2–1.2)
Total Protein: 7.6 g/dL (ref 6.0–8.3)

## 2023-11-25 LAB — CBC WITH DIFFERENTIAL/PLATELET
Basophils Absolute: 0.1 10*3/uL (ref 0.0–0.1)
Basophils Relative: 0.8 % (ref 0.0–3.0)
Eosinophils Absolute: 0.1 10*3/uL (ref 0.0–0.7)
Eosinophils Relative: 1.3 % (ref 0.0–5.0)
HCT: 42.6 % (ref 36.0–46.0)
Hemoglobin: 14 g/dL (ref 12.0–15.0)
Lymphocytes Relative: 27.9 % (ref 12.0–46.0)
Lymphs Abs: 2.2 10*3/uL (ref 0.7–4.0)
MCHC: 33 g/dL (ref 30.0–36.0)
MCV: 94.3 fl (ref 78.0–100.0)
Monocytes Absolute: 0.4 10*3/uL (ref 0.1–1.0)
Monocytes Relative: 5.2 % (ref 3.0–12.0)
Neutro Abs: 5 10*3/uL (ref 1.4–7.7)
Neutrophils Relative %: 64.8 % (ref 43.0–77.0)
Platelets: 237 10*3/uL (ref 150.0–400.0)
RBC: 4.52 Mil/uL (ref 3.87–5.11)
RDW: 15 % (ref 11.5–15.5)
WBC: 7.7 10*3/uL (ref 4.0–10.5)

## 2023-11-25 LAB — TSH: TSH: 3.05 u[IU]/mL (ref 0.35–5.50)

## 2023-11-25 LAB — HEMOGLOBIN A1C: Hgb A1c MFr Bld: 6.5 % (ref 4.6–6.5)

## 2023-11-25 LAB — LIPID PANEL
Cholesterol: 193 mg/dL (ref 0–200)
HDL: 50 mg/dL (ref >39.00)
LDL Cholesterol: 104 mg/dL — ABNORMAL HIGH (ref 0–99)
NonHDL: 142.96
Total CHOL/HDL Ratio: 4
Triglycerides: 197 mg/dL — ABNORMAL HIGH (ref 0.0–149.0)
VLDL: 39.4 mg/dL (ref 0.0–40.0)

## 2023-11-25 NOTE — Assessment & Plan Note (Signed)
Chronic Sleeps through the night - discussed that she is on too much medication - she will decrease clonazepam to 0.5 mg nightly and decrease tizanidine dose Would like to continue to decrease her medications - discussed high risk due to polypharmacy Controlled Continue seroquel 200 mg nightly

## 2023-11-25 NOTE — Assessment & Plan Note (Addendum)
 Chronic kidney disease CMP , CBC Stressed trying to decrease medications Stressed increase fluids Stressed smoking cessation  - use nicotine patches Stressed no NSAIDs Discussed importance of good blood pressure and sugar control

## 2023-11-25 NOTE — Assessment & Plan Note (Signed)
Chronic Controlled, Stable Continue Effexor XR 300 mg daily, clonazepam 1 mg once bedtime

## 2023-11-25 NOTE — Assessment & Plan Note (Signed)
 Chronic back pain History of multiple surgeries Follows with orthopedics, pain management Norco and fentanyl-prescribed by orthopedics/pain management Continue gabapentin 400 mg 3 times daily On tizanidine- takes 1-2 times

## 2023-11-25 NOTE — Assessment & Plan Note (Addendum)
 Chronic BP controlled Continue amlodipine-benazepril 10-40 mg daily, nebivolol 10 mg daily, doxazosin 2 mg daily She does take hydrochlorothiazide on occasion when she has a headache or leg swelling, but in the past this has caused her CKD to worsen so I would like to avoid this on a regular basis

## 2023-11-25 NOTE — Assessment & Plan Note (Signed)
Chronic Controlled, Stable Continue Effexor XR 300 mg daily

## 2023-11-25 NOTE — Assessment & Plan Note (Addendum)
 Chronic Taking gabapentin 400 mg TID, tizanidine 4 mg nightly On requip

## 2023-11-25 NOTE — Assessment & Plan Note (Signed)
 Chronic   Lab Results  Component Value Date   HGBA1C 6.3 05/28/2023   Sugars well controlled with lifestyle Check A1c Continue lifestyle control

## 2023-11-25 NOTE — Assessment & Plan Note (Signed)
Chronic GERD controlled Continue omeprazole 40 mg twice daily

## 2023-11-25 NOTE — Assessment & Plan Note (Signed)
Chronic °Regular exercise and healthy diet encouraged °Check lipid panel  °Continue atorvastatin 20 mg daily °

## 2023-11-25 NOTE — Assessment & Plan Note (Addendum)
 Acute Was diagnosed with UTI x 2 this month Urine culture 3/6 was negative for infection Has taken 2 different antibiotics Symptoms have resolved

## 2023-11-25 NOTE — Assessment & Plan Note (Addendum)
 Chronic Has chronic cough with chest congestion, denies SOB, wheeze, but not very active Continue  breztri 2 puffs bid - uses prn Stressed smoking cessation

## 2023-11-25 NOTE — Assessment & Plan Note (Signed)
Chronic  Check tsh and will titrate med dose if needed Currently taking levothyroxine 88 mcg daily

## 2023-11-26 ENCOUNTER — Encounter: Payer: Self-pay | Admitting: Internal Medicine

## 2023-11-28 NOTE — Telephone Encounter (Signed)
Re-routing to correct practice.

## 2023-12-08 ENCOUNTER — Ambulatory Visit: Admitting: Physician Assistant

## 2023-12-11 ENCOUNTER — Ambulatory Visit (INDEPENDENT_AMBULATORY_CARE_PROVIDER_SITE_OTHER): Admitting: Physician Assistant

## 2023-12-11 ENCOUNTER — Encounter: Payer: Self-pay | Admitting: Physician Assistant

## 2023-12-11 ENCOUNTER — Other Ambulatory Visit (INDEPENDENT_AMBULATORY_CARE_PROVIDER_SITE_OTHER): Payer: Self-pay

## 2023-12-11 DIAGNOSIS — M25511 Pain in right shoulder: Secondary | ICD-10-CM | POA: Diagnosis not present

## 2023-12-11 NOTE — Progress Notes (Signed)
 Office Visit Note   Patient: Jill Phillips           Date of Birth: June 17, 1956           MRN: [id number] Visit Date: 12/11/2023              Requested by: Pincus Sanes, MD [address] [place],  [place] [address] PCP: Pincus Sanes, MD   Assessment & Plan: Visit Diagnoses:  1. Right shoulder pain, unspecified chronicity     Plan: Pleasant 68 year old woman complaining of right shoulder pain.  She denies any particular injury.  Points to the pain over her right shoulder.  Denies any paresthesias.  Exam consistent with some impingement syndrome.  Will try a subacromial injection today may follow-up in 3 weeks  Follow-Up Instructions: Return in about 3 weeks (around 01/01/2024).   Orders:  Orders Placed This Encounter  Procedures   XR Shoulder Right   No orders of the defined types were placed in this encounter.     Procedures: No procedures performed   Clinical Data: No additional findings.   Subjective: No chief complaint on file.   HPI pleasant 68 year old woman comes in with a chief complaint of right shoulder pain.  No particular injury.  But she notices she has significant pain when sleeping on that side at night.  Has just been treating this symptomatically denies any fever chills any paresthesias  Review of Systems  All other systems reviewed and are negative.    Objective: Vital Signs: There were no vitals taken for this visit.  Physical Exam Constitutional:      Appearance: Normal appearance.  Pulmonary:     Effort: Pulmonary effort is normal.  Neurological:     General: No focal deficit present.     Mental Status: She is alert and oriented to person, place, and time.  Psychiatric:        Mood and Affect: Mood normal.        Behavior: Behavior normal.     Ortho Exam Right shoulder she has fairly good forward elevation internal rotation behind her back is somewhat painful she has some pain with empty can testing minimal pain with speeds  testing though slightly weak.  No pain with external rotation grip strength is intact biceps triceps strength intact Specialty Comments:  No specialty comments available.  Imaging: XR Shoulder Right Result Date: 12/11/2023 The graphs of her right shoulder demonstrate some degenerative changes of the [facility] joint.  No acute fracture location does have a little bit of glenohumeral arthritis as well    PMFS History: Patient Active Problem List   Diagnosis Date Noted   Yeast vaginitis 11/13/2023   Dysuria 11/06/2023   Status post total left knee replacement 02/11/2023   Chronic pelvic pain in female 09/11/2022   Pain of left thumb 09/11/2022   Chest pain 07/30/2022   Onychomycosis 10/04/2021   Itching 07/16/2021   Acute gout 06/20/2020   Interstitial cystitis 01/21/2020   Acute cystitis 11/08/2019   Status post shoulder surgery 09/09/2019   Nausea 08/19/2019   Anxiety 05/04/2019   Insomnia 05/04/2019   Syncope 12/08/2018   Disorder of rotator cuff syndrome of left shoulder and allied disorder 06/22/2018   Bilateral leg edema 06/04/2018   Type 2 diabetes mellitus without complication, without long-term current use of insulin (HCC) 12/02/2017   Sacral pain 09/17/2017   COPD (chronic obstructive pulmonary disease) (HCC) 04/21/2017   Lower back pain 03/03/2017   CKD (chronic  kidney disease) stage 3, GFR 30-59 ml/min (HCC) 08/07/2016   GERD (gastroesophageal reflux disease) 08/07/2016   Chronic respiratory failure (HCC) 07/29/2016   Arthritis of carpometacarpal (CMC) joint of left thumb 07/09/2016   Pneumothorax on left 01/31/2016   Chronic, continuous use of opioids 09/06/2015   Degenerative disc disease, lumbar 08/01/2015    Class: Chronic   Spondylolisthesis of lumbar region 08/01/2015    Class: Chronic   Hypoxia 09/14/2014   Constipation 05/05/2014   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/14/2013   HTN (hypertension) 05/19/2013   HLD (hyperlipidemia) 05/19/2013   Depression 05/19/2013    Hypothyroidism 05/19/2013   Tobacco dependence due to cigarettes    Osteoarthritis of both knees 11/18/2011   RLS (restless legs syndrome) 11/18/2011   Past Medical History:  Diagnosis Date   Active smoker    Anxiety    Calcifying tendinitis of shoulder    Chronic back pain    Chronic kidney disease 2022   related to potential overdose   Chronic pain syndrome    COPD (chronic obstructive pulmonary disease) (HCC)    Dyspnea    with exertion   Dysthymic disorder    Emphysema lung (HCC)    GERD (gastroesophageal reflux disease)    Heart murmur    for years, nothing to be concerned about   Herpes genitalia    History of blood transfusion 09/02/1978   related to "back surgery"   Hyperlipidemia    Hypertension    Hypothyroidism    Lumbago    Osteoarthrosis, unspecified whether generalized or localized, lower leg    Pain in joint, upper arm    Pneumothorax, left 01/31/2016   S/P Left posterior subcostal pain injection on 01/30/2016   PONV (postoperative nausea and vomiting)    gets nauseous  with longer surgery. Difficuty voiding after surgery   Postlaminectomy syndrome, thoracic region    Pre-diabetes    Primary localized osteoarthrosis, lower leg    Restless leg syndrome    Sleep apnea    s/p surgery- last sleep study 2011- doesnt use oxygen or machine at night as instructed,.   12/2014- Dr Craige Cotta  reports it is negative.   Thyroid disease     Family History  Problem Relation Age of Onset   Kidney disease Mother    Heart disease Father    Heart disease Sister 12       s/p CABG   Hypertension Sister    Anuerysm Brother 17       brain   Heart disease Brother    Colon cancer Neg Hx    Breast cancer Neg Hx    Ovarian cancer Neg Hx     Past Surgical History:  Procedure Laterality Date   APPENDECTOMY     BACK SURGERY     18 back surgeries (2 thoracic & 16 lumbar) (01/31/2016)   DILATION AND CURETTAGE OF UTERUS     HAMMER TOE SURGERY     IR RADIOLOGIST EVAL & MGMT   07/21/2017   JOINT REPLACEMENT     KNEE ARTHROSCOPY Right    LAPAROSCOPIC CHOLECYSTECTOMY     LUMBAR FUSION N/A 08/01/2015   Procedure: Right sided L1-2 and L2-3 transforaminal lumbar interbody fusion with cages, Extension of posterior fusion T12 to L3, Replaced pedicle screws bilaterally L1-L2 , Replaced left sided pedicle screws L-3. Instrumentation T12 to L3 using local bone graft, Vivigen allograft and cancellous chips;  Surgeon: Kerrin Champagne, MD;  Location: [facility] OR;  Service: Orthopedics;  Laterality: N/A;  LUMBAR LAMINECTOMY/DECOMPRESSION MICRODISCECTOMY N/A 01/28/2014   Procedure: Minimally Invasive Right  L1-2 Microdiscectomy;  Surgeon: Kerrin Champagne, MD;  Location: [facility] OR;  Service: Orthopedics;  Laterality: N/A;   REVERSE SHOULDER ARTHROPLASTY Left 09/09/2019   Procedure: LEFT REVERSE SHOULDER REPLACEMENT;  Surgeon: Cammy Copa, MD;  Location: [facility] OR;  Service: Orthopedics;  Laterality: Left;   TOTAL HIP ARTHROPLASTY Right    TOTAL KNEE ARTHROPLASTY  05/29/2012   Procedure: TOTAL KNEE ARTHROPLASTY;  Surgeon: Kathryne Hitch, MD;  Location: [facility] ORS;  Service: Orthopedics;  Laterality: Right;  Right Total Knee Arthroplasty   TOTAL KNEE ARTHROPLASTY Left 02/11/2023   Procedure: LEFT TOTAL KNEE ARTHROPLASTY;  Surgeon: Kathryne Hitch, MD;  Location: [facility] OR;  Service: Orthopedics;  Laterality: Left;   TUBAL LIGATION     UVULOPALATOPHARYNGOPLASTY     VAGINAL HYSTERECTOMY     Social History   Occupational History   Occupation: disability    Comment: back surgeries  Tobacco Use   Smoking status: Every Day    Current packs/day: 1.00    Average packs/day: 1 pack/day for 45.0 years (45.0 ttl pk-yrs)    Types: Cigarettes   Smokeless tobacco: Never   Tobacco comments:    Uses Nicoderm patch discussed 1-800-quit-now  Vaping Use   Vaping status: Never Used  Substance and Sexual Activity   Alcohol use: No    Alcohol/week: 0.0 standard drinks of alcohol   Drug use: Not  Currently    Types: Marijuana    Comment: 01/31/2016 "none since ~ 1980"   Sexual activity: Not Currently    Partners: Male    Birth control/protection: Surgical

## 2023-12-16 ENCOUNTER — Other Ambulatory Visit: Payer: Self-pay | Admitting: Internal Medicine

## 2023-12-22 ENCOUNTER — Other Ambulatory Visit: Payer: Self-pay | Admitting: Internal Medicine

## 2024-01-01 ENCOUNTER — Ambulatory Visit: Admitting: Physician Assistant

## 2024-01-08 DIAGNOSIS — R32 Unspecified urinary incontinence: Secondary | ICD-10-CM | POA: Insufficient documentation

## 2024-01-08 NOTE — Assessment & Plan Note (Signed)
 Chronic Difficulty holding urine Likely combination of stress and urge incontinence Has seen urology in the past and does have interstitial cystitis, chronic pelvic pain Incontinence supplies are medically necessary

## 2024-01-09 ENCOUNTER — Ambulatory Visit: Admitting: Physician Assistant

## 2024-01-13 ENCOUNTER — Other Ambulatory Visit: Payer: Self-pay | Admitting: Internal Medicine

## 2024-01-13 ENCOUNTER — Telehealth: Payer: Self-pay | Admitting: Internal Medicine

## 2024-01-13 NOTE — Telephone Encounter (Signed)
 Copied from CRM [id number]. Topic: General - Other >> Jan 13, 2024  8:23 AM Clydene Darner H wrote: Reason for CRM: Tamajshia from [facility] called to follow up on the incontinence order that was faxed over on 01/06/24. She is requesting confirmation that the order was received.   Callback Number: [id number]  ---  Have we received form?

## 2024-01-14 NOTE — Telephone Encounter (Signed)
 Clinical notes faxed today

## 2024-01-16 ENCOUNTER — Emergency Department (HOSPITAL_COMMUNITY)

## 2024-01-16 ENCOUNTER — Other Ambulatory Visit: Payer: Self-pay

## 2024-01-16 ENCOUNTER — Encounter (HOSPITAL_COMMUNITY): Payer: Self-pay

## 2024-01-16 ENCOUNTER — Emergency Department (HOSPITAL_COMMUNITY)
Admission: EM | Admit: 2024-01-16 | Discharge: 2024-01-16 | Disposition: A | Discharge status: Home / Self Care | Attending: Emergency Medicine | Admitting: Emergency Medicine

## 2024-01-16 DIAGNOSIS — I129 Hypertensive chronic kidney disease with stage 1 through stage 4 chronic kidney disease, or unspecified chronic kidney disease: Secondary | ICD-10-CM | POA: Insufficient documentation

## 2024-01-16 DIAGNOSIS — R531 Weakness: Secondary | ICD-10-CM

## 2024-01-16 DIAGNOSIS — I959 Hypotension, unspecified: Secondary | ICD-10-CM | POA: Insufficient documentation

## 2024-01-16 DIAGNOSIS — Z96652 Presence of left artificial knee joint: Secondary | ICD-10-CM | POA: Insufficient documentation

## 2024-01-16 DIAGNOSIS — Z96641 Presence of right artificial hip joint: Secondary | ICD-10-CM | POA: Insufficient documentation

## 2024-01-16 DIAGNOSIS — Z7951 Long term (current) use of inhaled steroids: Secondary | ICD-10-CM | POA: Diagnosis not present

## 2024-01-16 DIAGNOSIS — R270 Ataxia, unspecified: Secondary | ICD-10-CM | POA: Diagnosis not present

## 2024-01-16 DIAGNOSIS — N183 Chronic kidney disease, stage 3 unspecified: Secondary | ICD-10-CM | POA: Diagnosis not present

## 2024-01-16 DIAGNOSIS — F17210 Nicotine dependence, cigarettes, uncomplicated: Secondary | ICD-10-CM | POA: Diagnosis not present

## 2024-01-16 DIAGNOSIS — E039 Hypothyroidism, unspecified: Secondary | ICD-10-CM | POA: Insufficient documentation

## 2024-01-16 DIAGNOSIS — Z79890 Hormone replacement therapy (postmenopausal): Secondary | ICD-10-CM | POA: Diagnosis not present

## 2024-01-16 DIAGNOSIS — Z79899 Other long term (current) drug therapy: Secondary | ICD-10-CM | POA: Insufficient documentation

## 2024-01-16 DIAGNOSIS — R5383 Other fatigue: Secondary | ICD-10-CM | POA: Diagnosis present

## 2024-01-16 DIAGNOSIS — J449 Chronic obstructive pulmonary disease, unspecified: Secondary | ICD-10-CM | POA: Insufficient documentation

## 2024-01-16 LAB — URINALYSIS, W/ REFLEX TO CULTURE (INFECTION SUSPECTED)
Bacteria, UA: NONE SEEN
Bilirubin Urine: NEGATIVE
Glucose, UA: NEGATIVE mg/dL
Hgb urine dipstick: NEGATIVE
Ketones, ur: NEGATIVE mg/dL
Leukocytes,Ua: NEGATIVE
Nitrite: NEGATIVE
Protein, ur: NEGATIVE mg/dL
Specific Gravity, Urine: 1.008 (ref 1.005–1.030)
pH: 5 (ref 5.0–8.0)

## 2024-01-16 LAB — RAPID URINE DRUG SCREEN, HOSP PERFORMED
Amphetamines: NOT DETECTED
Barbiturates: NOT DETECTED
Benzodiazepines: NOT DETECTED
Cocaine: NOT DETECTED
Opiates: POSITIVE — AB
Tetrahydrocannabinol: NOT DETECTED

## 2024-01-16 LAB — CBC WITH DIFFERENTIAL/PLATELET
Abs Immature Granulocytes: 0.02 10*3/uL (ref 0.00–0.07)
Basophils Absolute: 0 10*3/uL (ref 0.0–0.1)
Basophils Relative: 1 %
Eosinophils Absolute: 0.1 10*3/uL (ref 0.0–0.5)
Eosinophils Relative: 2 %
HCT: 37.4 % (ref 36.0–46.0)
Hemoglobin: 11.7 g/dL — ABNORMAL LOW (ref 12.0–15.0)
Immature Granulocytes: 0 %
Lymphocytes Relative: 41 %
Lymphs Abs: 2.5 10*3/uL (ref 0.7–4.0)
MCH: 31 pg (ref 26.0–34.0)
MCHC: 31.3 g/dL (ref 30.0–36.0)
MCV: 98.9 fL (ref 80.0–100.0)
Monocytes Absolute: 0.4 10*3/uL (ref 0.1–1.0)
Monocytes Relative: 6 %
Neutro Abs: 3.1 10*3/uL (ref 1.7–7.7)
Neutrophils Relative %: 50 %
Platelets: 194 10*3/uL (ref 150–400)
RBC: 3.78 MIL/uL — ABNORMAL LOW (ref 3.87–5.11)
RDW: 14.2 % (ref 11.5–15.5)
WBC: 6.2 10*3/uL (ref 4.0–10.5)
nRBC: 0 % (ref 0.0–0.2)

## 2024-01-16 LAB — COMPREHENSIVE METABOLIC PANEL WITH GFR
ALT: 12 U/L (ref 0–44)
AST: 18 U/L (ref 15–41)
Albumin: 3.6 g/dL (ref 3.5–5.0)
Alkaline Phosphatase: 107 U/L (ref 38–126)
Anion gap: 11 (ref 5–15)
BUN: 21 mg/dL (ref 8–23)
CO2: 27 mmol/L (ref 22–32)
Calcium: 8.9 mg/dL (ref 8.9–10.3)
Chloride: 106 mmol/L (ref 98–111)
Creatinine, Ser: 1.32 mg/dL — ABNORMAL HIGH (ref 0.44–1.00)
GFR, Estimated: 44 mL/min — ABNORMAL LOW (ref >60)
Glucose, Bld: 71 mg/dL (ref 70–99)
Potassium: 3.9 mmol/L (ref 3.5–5.1)
Sodium: 144 mmol/L (ref 135–145)
Total Bilirubin: 0.5 mg/dL (ref 0.0–1.2)
Total Protein: 6.2 g/dL — ABNORMAL LOW (ref 6.5–8.1)

## 2024-01-16 LAB — PROTIME-INR
INR: 1 (ref 0.8–1.2)
Prothrombin Time: 13 s (ref 11.4–15.2)

## 2024-01-16 LAB — ACETAMINOPHEN LEVEL: Acetaminophen (Tylenol), Serum: <10 ug/mL — ABNORMAL LOW (ref 10–30)

## 2024-01-16 LAB — TROPONIN I (HIGH SENSITIVITY)
Troponin I (High Sensitivity): 7 ng/L (ref <18)
Troponin I (High Sensitivity): 7 ng/L (ref <18)

## 2024-01-16 LAB — ETHANOL: Alcohol, Ethyl (B): <15 mg/dL (ref <15)

## 2024-01-16 LAB — SALICYLATE LEVEL: Salicylate Lvl: <7 mg/dL — ABNORMAL LOW (ref 7.0–30.0)

## 2024-01-16 LAB — I-STAT CG4 LACTIC ACID, ED: Lactic Acid, Venous: 1 mmol/L (ref 0.5–1.9)

## 2024-01-16 MED ORDER — LACTATED RINGERS IV BOLUS (SEPSIS)
2000.0000 mL | Freq: Once | INTRAVENOUS | Status: AC
  Administered 2024-01-16: 2000 mL via INTRAVENOUS

## 2024-01-16 NOTE — ED Triage Notes (Signed)
 PT BIB EMS from [facility] and when she got into the lobby she had a sudden onset of fatigue.  Fentanyl  patch on her chest, is supposed to change it today.   80/62--92/56 80RA  [place] 4 L 92% HR 57 CBG 107 Stroke Screen negative

## 2024-01-16 NOTE — Discharge Instructions (Signed)
 Today's evaluation has been reassuring.  Call your physician on Monday for a follow-up appointment.,  In particular discussed your medication regimen. Return here for concerning changes in your condition.

## 2024-01-16 NOTE — ED Provider Notes (Addendum)
 Care of the patient assumed at signout.  On signout patient awaiting ongoing fluid resuscitation, labs.  8:07 PM Patient awake, alert, smiling, we discussed all findings, urinalysis without infection, labs without substantial abnormalities, drug screen positive for opiates.  Creatinine slightly elevated and she has received fluid resuscitation.  MAP now in the 70s, patient is afebrile, no tachycardia, no current complaints.  Given the sepsis complaints, reassuring vital signs, I discussed discharge with close outpatient follow-up and the patient is amenable to this.  Patient has home oxygen  she uses intermittently, will continue to do so.  She will follow-up with her physician to discuss her medications specifically given some consideration of iatrogenic hypotension today.   Dorenda Gandy, MD 01/16/24 2008

## 2024-01-16 NOTE — ED Provider Notes (Signed)
 [facility] Provider Note  CSN: [id number] Arrival date & time: 01/16/24 1321  Chief Complaint(s) Fatigue  HPI Jill Phillips is a 68 y.o. female who is here today for fatigue, hypertension.  Patient was going to [facility] today for PCP visit, while there, she felt very fatigued in triage.  They checked her blood pressure and it was low.  They sent her to the ED for evaluation.  Patient has a history of chronic back pain.  Wears a fentanyl  patch.  Was supposed to be changed today.  She also takes hydrocodone  for back pain.  Denies any other substance use today.  Denies any fever, chills,   Past Medical History Past Medical History:  Diagnosis Date   Active smoker    Anxiety    Calcifying tendinitis of shoulder    Chronic back pain    Chronic kidney disease 2022   related to potential overdose   Chronic pain syndrome    COPD (chronic obstructive pulmonary disease) (HCC)    Dyspnea    with exertion   Dysthymic disorder    Emphysema lung (HCC)    GERD (gastroesophageal reflux disease)    Heart murmur    for years, nothing to be concerned about   Herpes genitalia    History of blood transfusion 09/02/1978   related to "back surgery"   Hyperlipidemia    Hypertension    Hypothyroidism    Lumbago    Osteoarthrosis, unspecified whether generalized or localized, lower leg    Pain in joint, upper arm    Pneumothorax, left 01/31/2016   S/P Left posterior subcostal pain injection on 01/30/2016   PONV (postoperative nausea and vomiting)    gets nauseous  with longer surgery. Difficuty voiding after surgery   Postlaminectomy syndrome, thoracic region    Pre-diabetes    Primary localized osteoarthrosis, lower leg    Restless leg syndrome    Sleep apnea    s/p surgery- last sleep study 2011- doesnt use oxygen  or machine at night as instructed,.   12/2014- Dr Matilde Son  reports it is negative.   Thyroid  disease    Patient Active Problem  List   Diagnosis Date Noted   Urinary incontinence 01/08/2024   Yeast vaginitis 11/13/2023   Dysuria 11/06/2023   Status post total left knee replacement 02/11/2023   Chronic pelvic pain in female 09/11/2022   Pain of left thumb 09/11/2022   Chest pain 07/30/2022   Onychomycosis 10/04/2021   Itching 07/16/2021   Acute gout 06/20/2020   Interstitial cystitis 01/21/2020   Acute cystitis 11/08/2019   Status post shoulder surgery 09/09/2019   Nausea 08/19/2019   Anxiety 05/04/2019   Insomnia 05/04/2019   Syncope 12/08/2018   Disorder of rotator cuff syndrome of left shoulder and allied disorder 06/22/2018   Bilateral leg edema 06/04/2018   Type 2 diabetes mellitus without complication, without long-term current use of insulin  (HCC) 12/02/2017   Sacral pain 09/17/2017   COPD (chronic obstructive pulmonary disease) (HCC) 04/21/2017   Lower back pain 03/03/2017   CKD (chronic kidney disease) stage 3, GFR 30-59 ml/min (HCC) 08/07/2016   GERD (gastroesophageal reflux disease) 08/07/2016   Chronic respiratory failure (HCC) 07/29/2016   Arthritis of carpometacarpal (CMC) joint of left thumb 07/09/2016   Pneumothorax on left 01/31/2016   Chronic, continuous use of opioids 09/06/2015   Degenerative disc disease, lumbar 08/01/2015    Class: Chronic   Spondylolisthesis of lumbar region 08/01/2015  Class: Chronic   Hypoxia 09/14/2014   Constipation 05/05/2014   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/14/2013   HTN (hypertension) 05/19/2013   HLD (hyperlipidemia) 05/19/2013   Depression 05/19/2013   Hypothyroidism 05/19/2013   Tobacco dependence due to cigarettes    Osteoarthritis of both knees 11/18/2011   RLS (restless legs syndrome) 11/18/2011   Home Medication(s) Prior to Admission medications   Medication Sig Start Date End Date Taking? Authorizing Provider  acetaminophen  (TYLENOL ) 500 MG tablet Take 1,000 mg by mouth daily as needed for moderate pain or headache.    [provider]   amLODipine -benazepril  (LOTREL) 10-40 MG capsule TAKE 1 CAPSULE BY MOUTH DAILY. 12/22/23   Colene Dauphin, MD  atorvastatin  (LIPITOR) 20 MG tablet TAKE 1 TABLET BY MOUTH DAILY. 08/20/23   Colene Dauphin, MD  Budeson-Glycopyrrol-Formoterol (BREZTRI  AEROSPHERE) 160-9-4.8 MCG/ACT AERO INHALE 2 PUFFS INTO THE LUNGS 2 TIMES DAILY. 11/10/23   Colene Dauphin, MD  clonazePAM  (KLONOPIN ) 1 MG tablet TAKE 1 TABLET BY MOUTH AT BEDTIME. 11/14/23   Burns, Beckey Bourgeois, MD  docusate sodium  (COLACE) 100 MG capsule Take 200 mg by mouth at bedtime.    [provider]  doxazosin  (CARDURA ) 2 MG tablet Take 1 tablet (2 mg total) by mouth daily. 10/10/23   Colene Dauphin, MD  fentaNYL  (DURAGESIC ) 25 MCG/HR Place 1 patch onto the skin every 3 (three) days. 07/15/22   [provider]  gabapentin  (NEURONTIN ) 400 MG capsule TAKE 1 CAPSULE BY MOUTH 3 TIMES DAILY. 12/16/23   Colene Dauphin, MD  HYDROcodone -acetaminophen  (NORCO/VICODIN) 5-325 MG tablet Take 1-2 tablets by mouth every 4 (four) hours as needed for moderate pain. 02/14/23   Arnie Lao, MD  levothyroxine  (SYNTHROID ) 88 MCG tablet TAKE 1 TABLET BY MOUTH DAILY. 09/09/23   Colene Dauphin, MD  NARCAN  4 MG/0.1ML LIQD nasal spray kit Place 1 spray into the nose as needed (accidental overdose). 03/15/20   Alphonso Jean, MD  nebivolol  (BYSTOLIC ) 10 MG tablet TAKE 1 TABLET (10 MG TOTAL) BY MOUTH DAILY. 11/21/23   Colene Dauphin, MD  nicotine  (NICODERM CQ  - DOSED IN MG/24 HOURS) 21 mg/24hr patch Place 1 patch (21 mg total) onto the skin daily. 10/10/23   Colene Dauphin, MD  omeprazole  (PRILOSEC) 40 MG capsule TAKE 1 CAPSULE BY MOUTH 2 TIMES DAILY BEFORE A MEAL. 08/05/23   Colene Dauphin, MD  Psyllium (VEGETABLE LAXATIVE PO) Take 1 tablet by mouth daily.    [provider]  QUEtiapine  (SEROQUEL ) 200 MG tablet TAKE 1 TABLET (200 MG TOTAL) BY MOUTH AT BEDTIME. 01/13/24   Colene Dauphin, MD  rOPINIRole  (REQUIP ) 1 MG tablet Take 1-2 tablets (1-2 mg total) by  mouth 2 (two) times daily as needed (restless leg). 02/13/23   Arnie Lao, MD  solifenacin (VESICARE) 5 MG tablet Take 10 mg by mouth daily.    [provider]  tiZANidine  (ZANAFLEX ) 4 MG tablet TAKE 1 TABLET BY MOUTH EVERY 8 HOURS AS NEEDED. Patient taking differently: Take 4 mg by mouth every 8 (eight) hours as needed for muscle spasms. 11/14/23   Colene Dauphin, MD  valACYclovir  (VALTREX ) 500 MG tablet TAKE 1 TABLET BY MOUTH DAILY. 08/04/23   Colene Dauphin, MD  venlafaxine  XR (EFFEXOR -XR) 150 MG 24 hr capsule TAKE 2 CAPSULES BY MOUTH DAILY. 08/15/23   Colene Dauphin, MD  Past Surgical History Past Surgical History:  Procedure Laterality Date   APPENDECTOMY     BACK SURGERY     18 back surgeries (2 thoracic & 16 lumbar) (01/31/2016)   DILATION AND CURETTAGE OF UTERUS     HAMMER TOE SURGERY     IR RADIOLOGIST EVAL & MGMT  07/21/2017   JOINT REPLACEMENT     KNEE ARTHROSCOPY Right    LAPAROSCOPIC CHOLECYSTECTOMY     LUMBAR FUSION N/A 08/01/2015   Procedure: Right sided L1-2 and L2-3 transforaminal lumbar interbody fusion with cages, Extension of posterior fusion T12 to L3, Replaced pedicle screws bilaterally L1-L2 , Replaced left sided pedicle screws L-3. Instrumentation T12 to L3 using local bone graft, Vivigen allograft and cancellous chips;  Surgeon: Alphonso Jean, MD;  Location: [facility] OR;  Service: Orthopedics;  Laterality: N/A;   LUMBAR LAMINECTOMY/DECOMPRESSION MICRODISCECTOMY N/A 01/28/2014   Procedure: Minimally Invasive Right  L1-2 Microdiscectomy;  Surgeon: Alphonso Jean, MD;  Location: [facility] OR;  Service: Orthopedics;  Laterality: N/A;   REVERSE SHOULDER ARTHROPLASTY Left 09/09/2019   Procedure: LEFT REVERSE SHOULDER REPLACEMENT;  Surgeon: Jasmine Mesi, MD;  Location: [facility] OR;  Service: Orthopedics;  Laterality: Left;   TOTAL HIP  ARTHROPLASTY Right    TOTAL KNEE ARTHROPLASTY  05/29/2012   Procedure: TOTAL KNEE ARTHROPLASTY;  Surgeon: Arnie Lao, MD;  Location: [facility] ORS;  Service: Orthopedics;  Laterality: Right;  Right Total Knee Arthroplasty   TOTAL KNEE ARTHROPLASTY Left 02/11/2023   Procedure: LEFT TOTAL KNEE ARTHROPLASTY;  Surgeon: Arnie Lao, MD;  Location: [facility] OR;  Service: Orthopedics;  Laterality: Left;   TUBAL LIGATION     UVULOPALATOPHARYNGOPLASTY     VAGINAL HYSTERECTOMY     Family History Family History  Problem Relation Age of Onset   Kidney disease Mother    Heart disease Father    Heart disease Sister 49       s/p CABG   Hypertension Sister    Anuerysm Brother 44       brain   Heart disease Brother    Colon cancer Neg Hx    Breast cancer Neg Hx    Ovarian cancer Neg Hx     Social History Social History   Tobacco Use   Smoking status: Every Day    Current packs/day: 1.00    Average packs/day: 1 pack/day for 45.0 years (45.0 ttl pk-yrs)    Types: Cigarettes   Smokeless tobacco: Never   Tobacco comments:    Uses Nicoderm patch discussed 1-800-quit-now  Vaping Use   Vaping status: Never Used  Substance Use Topics   Alcohol  use: No    Alcohol /week: 0.0 standard drinks of alcohol    Drug use: Not Currently    Types: Marijuana    Comment: 01/31/2016 "none since ~ 1980"   Allergies Flagyl  [metronidazole ], Limonene, Sulfa antibiotics, Doxycycline , Ibuprofen , Amoxicillin , Chlorzoxazone, Codeine, Darvocet [propoxyphene n-acetaminophen ], Dilaudid  [hydromorphone  hcl], Hydralazine , Keflex  [cephalexin ], Morphine  and codeine, Nitrofurantoin monohyd macro, Percocet [oxycodone -acetaminophen ], and Trazodone and nefazodone  Review of Systems Review of Systems  Physical Exam Vital Signs  I have reviewed the triage vital signs BP (!) 83/53   Pulse (!) 56   Temp 98 F (36.7 C) (Oral)   Resp 14   SpO2 95%   Physical Exam  ED Results and Treatments Labs (all labs  ordered are listed, but only abnormal results are displayed) Labs Reviewed  CULTURE, BLOOD (ROUTINE X 2)  CULTURE, BLOOD (ROUTINE X 2)  COMPREHENSIVE METABOLIC PANEL WITH  GFR  CBC WITH DIFFERENTIAL/PLATELET  PROTIME-INR  URINALYSIS, W/ REFLEX TO CULTURE (INFECTION SUSPECTED)  RAPID URINE DRUG SCREEN, HOSP PERFORMED  ETHANOL  SALICYLATE LEVEL  ACETAMINOPHEN  LEVEL  I-STAT CG4 LACTIC ACID, ED  TROPONIN I (HIGH SENSITIVITY)                                                                                                                          Radiology DG Chest Port 1 View Result Date: 01/16/2024 CLINICAL DATA:  Sepsis. EXAM: PORTABLE CHEST 1 VIEW COMPARISON:  Chest radiograph dated 02/14/2023. FINDINGS: No focal consolidation, pleural effusion or pneumothorax. There is mild cardiomegaly with mild vascular congestion. Degenerative changes of the spine. Lower thoracic and lumbar fusion hardware as well as left shoulder arthroplasty. No acute osseous pathology. IMPRESSION: 1. No focal consolidation. 2. Mild cardiomegaly with mild vascular congestion. Electronically Signed   By: Angus Bark M.D.   On: 01/16/2024 14:27    Pertinent labs & imaging results that were available during my care of the patient were reviewed by me and considered in my medical decision making (see MDM for details).  Medications Ordered in ED Medications  lactated ringers  bolus 2,000 mL (2,000 mLs Intravenous New Bag/Given 01/16/24 1427)                                                                                                                                     Procedures Procedures  (including critical care time)  Medical Decision Making / ED Course   This patient presents to the ED for concern of fatigue, hypotension, this involves an extensive number of treatment options, and is a complaint that carries with it a high risk of complications and morbidity.  The differential diagnosis includes  polypharmacy, sepsis, dehydration, less likely hemorrhagic shock, hypothyroidism.  MDM: On exam, patient is alert, oriented.  She does have slow speech but no focal neurological deficits.  Patient with fentanyl  pain patch which I removed as a believe this could be contributing to her symptoms.  Patient does have multiple medications which are sedating at home including multiple narcotics.  She is afebrile, nontachycardic.  Will also initiate a septic workup on this patient.  She has no abdominal pain or tenderness, I see no cellulitis.  She denies any urinary symptoms.  Will obtain a head CT on the patient.  Will start the patient on IV fluids, see if  we get improvement in her pressure.  Patient was signed out to Dr. Liam Redhead pending labs, likely admission.  Additional history obtained: -Additional history obtained from daughter at bedside -External records from outside source obtained and reviewed including: Chart review including previous notes, labs, imaging, consultation notes   Lab Tests: -I ordered, reviewed, and interpreted labs.   The pertinent results include:   Labs Reviewed  CULTURE, BLOOD (ROUTINE X 2)  CULTURE, BLOOD (ROUTINE X 2)  COMPREHENSIVE METABOLIC PANEL WITH GFR  CBC WITH DIFFERENTIAL/PLATELET  PROTIME-INR  URINALYSIS, W/ REFLEX TO CULTURE (INFECTION SUSPECTED)  RAPID URINE DRUG SCREEN, HOSP PERFORMED  ETHANOL  SALICYLATE LEVEL  ACETAMINOPHEN  LEVEL  I-STAT CG4 LACTIC ACID, ED  TROPONIN I (HIGH SENSITIVITY)     Cardiac Monitoring: The patient was maintained on a cardiac monitor.  I personally viewed and interpreted the cardiac monitored which showed an underlying rhythm of: Normal sinus rhythm   Reevaluation: After the interventions noted above, I reevaluated the patient and found that they have :improved  Co morbidities that complicate the patient evaluation  Past Medical History:  Diagnosis Date   Active smoker    Anxiety    Calcifying tendinitis  of shoulder    Chronic back pain    Chronic kidney disease 2022   related to potential overdose   Chronic pain syndrome    COPD (chronic obstructive pulmonary disease) (HCC)    Dyspnea    with exertion   Dysthymic disorder    Emphysema lung (HCC)    GERD (gastroesophageal reflux disease)    Heart murmur    for years, nothing to be concerned about   Herpes genitalia    History of blood transfusion 09/02/1978   related to "back surgery"   Hyperlipidemia    Hypertension    Hypothyroidism    Lumbago    Osteoarthrosis, unspecified whether generalized or localized, lower leg    Pain in joint, upper arm    Pneumothorax, left 01/31/2016   S/P Left posterior subcostal pain injection on 01/30/2016   PONV (postoperative nausea and vomiting)    gets nauseous  with longer surgery. Difficuty voiding after surgery   Postlaminectomy syndrome, thoracic region    Pre-diabetes    Primary localized osteoarthrosis, lower leg    Restless leg syndrome    Sleep apnea    s/p surgery- last sleep study 2011- doesnt use oxygen  or machine at night as instructed,.   12/2014- Dr Matilde Son  reports it is negative.   Thyroid  disease          Final Clinical Impression(s) / ED Diagnoses Final diagnoses:  Hypotension, unspecified hypotension type     @PCDICTATION @    Afton Horse T, DO 01/16/24 1522

## 2024-01-19 ENCOUNTER — Ambulatory Visit: Payer: Self-pay

## 2024-01-19 NOTE — Telephone Encounter (Signed)
 Copied from CRM [id number]. Topic: Clinical - Red Word Triage >> Jan 19, 2024  1:33 PM Adonis Hoot wrote: Red Word that prompted transfer to Nurse Triage: headache,elevated blood pressure 162/90.  Chief Complaint: elevated bp 162/90 Symptoms: headache, elevated bp, seen in ER Frequency: since yesterday Pertinent Negatives: Patient denies cp, sob, numbness, weakness Disposition: [] ED /[] Urgent Care (no appt availability in office) / [x] Appointment(In office/virtual)/ []  [facility]/ [] Home Care/ [] Refused Recommended Disposition /[] [facility]/ []  Follow-up with PCP Additional Notes: apt made for Wednesday; care advice given, denies questions; instructed to go to ER if becomes worse.   Reason for Disposition  Systolic BP  >= 160 OR Diastolic >= 100  Answer Assessment - Initial Assessment Questions 1. BLOOD PRESSURE: "What is the blood pressure?" "Did you take at least two measurements 5 minutes apart?"     162/90 2. ONSET: "When did you take your blood pressure?"     today 3. HOW: "How did you take your blood pressure?" (e.g., automatic home BP monitor, visiting nurse)     Automatic cuff 4. HISTORY: "Do you have a history of high blood pressure?"     yes 5. MEDICINES: "Are you taking any medicines for blood pressure?" "Have you missed any doses recently?"     denies 6. OTHER SYMPTOMS: "Do you have any symptoms?" (e.g., blurred vision, chest pain, difficulty breathing, headache, weakness)     Headache,  7. PREGNANCY: "Is there any chance you are pregnant?" "When was your last menstrual period?"     na  Protocols used: Blood Pressure - High-A-AH

## 2024-01-20 ENCOUNTER — Encounter: Payer: Self-pay | Admitting: Internal Medicine

## 2024-01-20 NOTE — Patient Instructions (Addendum)
       Medications changes include :   stop doxazosin .  Prednisone  x 6 days   Monitor  your BP at home    Return for follow up as scheduled.

## 2024-01-20 NOTE — Progress Notes (Signed)
 Subjective:    Patient ID: Jill Phillips, female    DOB: 24-Apr-1956, 68 y.o.   MRN: [id number]     HPI Beonca is here for follow up from the ED.   ED 5/16 for fatigue, hypotension.  She was at [facility] and BP was low - she was sent to ED.  BP in ED 83/53,  HR 56  Chest x-ray showed cardiomegaly with mild vascular congestion, troponin negative, CBC, CMP stable, blood culture x 2 negative.  CT head negative.  UA negative.  She received fluids.  No changes made to medication.  Chronic pelvic pain.   Chronic back pain.   Getting up or having a bowel movements causes pain.   Uses oxygen  intermittently if she needs it.    Left foot and leg swelled up yesterday - it was like that when she woke up.  Pain with applying pressure in the anterior ankle. No pain when sitting  BP at home has been 115-170's/ 70-90's  Medications and allergies reviewed with patient and updated if appropriate.  Current Outpatient Medications on File Prior to Visit  Medication Sig Dispense Refill   acetaminophen  (TYLENOL ) 500 MG tablet Take 1,000 mg by mouth daily as needed for moderate pain or headache.     amLODipine -benazepril  (LOTREL) 10-40 MG capsule TAKE 1 CAPSULE BY MOUTH DAILY. 90 capsule 1   atorvastatin  (LIPITOR) 20 MG tablet TAKE 1 TABLET BY MOUTH DAILY. 90 tablet 1   Budeson-Glycopyrrol-Formoterol (BREZTRI  AEROSPHERE) 160-9-4.8 MCG/ACT AERO INHALE 2 PUFFS INTO THE LUNGS 2 TIMES DAILY. 10.7 g 5   clonazePAM  (KLONOPIN ) 1 MG tablet TAKE 1 TABLET BY MOUTH AT BEDTIME. 30 tablet 3   docusate sodium  (COLACE) 100 MG capsule Take 200 mg by mouth at bedtime.     doxazosin  (CARDURA ) 2 MG tablet Take 1 tablet (2 mg total) by mouth daily. 30 tablet 5   fentaNYL  (DURAGESIC ) 25 MCG/HR Place 1 patch onto the skin every 3 (three) days.     gabapentin  (NEURONTIN ) 400 MG capsule TAKE 1 CAPSULE BY MOUTH 3 TIMES DAILY. 270 capsule 1   HYDROcodone -acetaminophen  (NORCO) 10-325 MG tablet Take 1 tablet by  mouth 3 (three) times daily as needed.     levothyroxine  (SYNTHROID ) 88 MCG tablet TAKE 1 TABLET BY MOUTH DAILY. 90 tablet 1   NARCAN  4 MG/0.1ML LIQD nasal spray kit Place 1 spray into the nose as needed (accidental overdose). 1 each 0   nebivolol  (BYSTOLIC ) 10 MG tablet TAKE 1 TABLET (10 MG TOTAL) BY MOUTH DAILY. 90 tablet 1   nicotine  (NICODERM CQ  - DOSED IN MG/24 HOURS) 21 mg/24hr patch Place 1 patch (21 mg total) onto the skin daily. 28 patch 2   omeprazole  (PRILOSEC) 40 MG capsule TAKE 1 CAPSULE BY MOUTH 2 TIMES DAILY BEFORE A MEAL. 180 capsule 3   Psyllium (VEGETABLE LAXATIVE PO) Take 1 tablet by mouth daily.     QUEtiapine  (SEROQUEL ) 200 MG tablet TAKE 1 TABLET (200 MG TOTAL) BY MOUTH AT BEDTIME. 90 tablet 1   rOPINIRole  (REQUIP ) 1 MG tablet Take 1-2 tablets (1-2 mg total) by mouth 2 (two) times daily as needed (restless leg). 60 tablet 0   solifenacin (VESICARE) 5 MG tablet Take 10 mg by mouth daily.     tiZANidine  (ZANAFLEX ) 4 MG tablet TAKE 1 TABLET BY MOUTH EVERY 8 HOURS AS NEEDED. (Patient taking differently: Take 4 mg by mouth every 8 (eight) hours as needed for muscle spasms.) 90 tablet  2   valACYclovir  (VALTREX ) 500 MG tablet TAKE 1 TABLET BY MOUTH DAILY. 90 tablet 1   venlafaxine  XR (EFFEXOR -XR) 150 MG 24 hr capsule TAKE 2 CAPSULES BY MOUTH DAILY. 180 capsule 1   HYDROcodone -acetaminophen  (NORCO/VICODIN) 5-325 MG tablet Take 1-2 tablets by mouth every 4 (four) hours as needed for moderate pain. (Patient not taking: Reported on 01/21/2024) 30 tablet 0   No current facility-administered medications on file prior to visit.     Review of Systems  Constitutional:  Negative for fever.  Cardiovascular:  Positive for leg swelling (left lower leg). Negative for chest pain and palpitations.  Musculoskeletal:  Positive for arthralgias and back pain.  Neurological:  Positive for headaches. Negative for light-headedness.       Objective:   Vitals:   01/21/24 0941  BP: 102/70  Pulse:  63  Temp: 98.3 F (36.8 C)  SpO2: 93%   BP Readings from Last 3 Encounters:  01/21/24 102/70  01/16/24 104/66  11/25/23 112/62   Wt Readings from Last 3 Encounters:  01/21/24 148 lb (67.1 kg)  01/16/24 158 lb 11.7 oz (72 kg)  11/25/23 145 lb 3.2 oz (65.9 kg)   Body mass index is 26.22 kg/m.    Physical Exam Constitutional:      General: She is not in acute distress.    Appearance: Normal appearance.  HENT:     Head: Normocephalic and atraumatic.  Eyes:     Conjunctiva/sclera: Conjunctivae normal.  Cardiovascular:     Rate and Rhythm: Normal rate and regular rhythm.     Heart sounds: Normal heart sounds.  Pulmonary:     Effort: Pulmonary effort is normal. No respiratory distress.     Breath sounds: Normal breath sounds. No wheezing.  Musculoskeletal:     Cervical back: Neck supple.     Right lower leg: No edema.     Left lower leg: Edema (mildly pitting edema) present.  Lymphadenopathy:     Cervical: No cervical adenopathy.  Skin:    General: Skin is warm and dry.     Findings: No erythema or rash.  Neurological:     Mental Status: She is alert. Mental status is at baseline.  Psychiatric:        Mood and Affect: Mood normal.        Behavior: Behavior normal.        Lab Results  Component Value Date   WBC 6.2 01/16/2024   HGB 11.7 (L) 01/16/2024   HCT 37.4 01/16/2024   PLT 194 01/16/2024   GLUCOSE 71 01/16/2024   CHOL 193 11/25/2023   TRIG 197.0 (H) 11/25/2023   HDL 50.00 11/25/2023   LDLDIRECT 110.0 07/16/2021   LDLCALC 104 (H) 11/25/2023   ALT 12 01/16/2024   AST 18 01/16/2024   NA 144 01/16/2024   K 3.9 01/16/2024   CL 106 01/16/2024   CREATININE 1.32 (H) 01/16/2024   BUN 21 01/16/2024   CO2 27 01/16/2024   TSH 3.05 11/25/2023   INR 1.0 01/16/2024   HGBA1C 6.5 11/25/2023   MICROALBUR 6.0 (H) 05/28/2023     Assessment & Plan:    See Problem List for Assessment and Plan of chronic medical problems.

## 2024-01-21 ENCOUNTER — Ambulatory Visit (INDEPENDENT_AMBULATORY_CARE_PROVIDER_SITE_OTHER): Admitting: Internal Medicine

## 2024-01-21 VITALS — BP 102/70 | HR 63 | Temp 98.3°F | Ht 63.0 in | Wt 148.0 lb

## 2024-01-21 DIAGNOSIS — M109 Gout, unspecified: Secondary | ICD-10-CM | POA: Diagnosis not present

## 2024-01-21 DIAGNOSIS — R32 Unspecified urinary incontinence: Secondary | ICD-10-CM | POA: Diagnosis not present

## 2024-01-21 DIAGNOSIS — I10 Essential (primary) hypertension: Secondary | ICD-10-CM | POA: Diagnosis not present

## 2024-01-21 LAB — CULTURE, BLOOD (ROUTINE X 2)
Culture: NO GROWTH
Culture: NO GROWTH

## 2024-01-21 MED ORDER — PREDNISONE 10 MG PO TABS: ORAL_TABLET | ORAL | 0 refills | Status: DC

## 2024-01-21 NOTE — Telephone Encounter (Signed)
 Copied from CRM [id number]. Topic: Clinical - Order For Equipment >> Jan 21, 2024  2:16 PM Kevelyn M wrote: Reason for CRM: Jill Phillips with [facility] asking about a fax sent on 5/12, 5/13, and 5/20 for doctor's request for incontinence supplies. [phone].

## 2024-01-21 NOTE — Assessment & Plan Note (Signed)
 Chronic BP controlled - too low here today and has been low at pain management/ED - but has been elevated at home Advised calibrating her home BP cuff Stop doxazosin  2 mg dial Continue amlodipine -benazepril  10-40 mg daily, nebivolol  10 mg daily

## 2024-01-21 NOTE — Assessment & Plan Note (Signed)
 Acute Symptoms c/w possible gout of left ankle - but it is hard to tell for sure Short prednisone  taper

## 2024-01-21 NOTE — Assessment & Plan Note (Signed)
 Chronic Difficulty holding urine Likely combination of stress and urge incontinence Has seen urology in the past and does have interstitial cystitis, chronic pelvic pain Incontinence supplies are medically necessary

## 2024-01-22 NOTE — Telephone Encounter (Signed)
 Faxed today

## 2024-01-29 ENCOUNTER — Other Ambulatory Visit: Payer: Self-pay | Admitting: Internal Medicine

## 2024-02-06 ENCOUNTER — Other Ambulatory Visit: Payer: Self-pay | Admitting: Internal Medicine

## 2024-02-13 ENCOUNTER — Other Ambulatory Visit: Payer: Self-pay | Admitting: Internal Medicine

## 2024-03-01 ENCOUNTER — Other Ambulatory Visit: Payer: Self-pay | Admitting: Internal Medicine

## 2024-03-03 ENCOUNTER — Other Ambulatory Visit: Payer: Self-pay | Admitting: Internal Medicine

## 2024-03-09 ENCOUNTER — Ambulatory Visit: Payer: Self-pay

## 2024-03-09 NOTE — Telephone Encounter (Signed)
 FYI Only or Action Required?: FYI only for provider.  Patient was last seen in primary care on 01/21/2024 by Jill Glade PARAS, MD.  Called Nurse Triage reporting Hypotension.  Symptoms began today.  Interventions attempted: Rest, hydration, or home remedies.  Symptoms are: gradually improving.  Triage Disposition: See HCP Within 4 Hours (Or PCP Triage)  Patient/caregiver understands and will follow disposition?: No  Copied from CRM [id number]. Topic: Clinical - Red Word Triage >> Mar 09, 2024 11:50 AM Jill Phillips wrote: [facility] that prompted transfer to Nurse Triage: This morning BP 93/52 pulse 58, a few minutes ago 91/47 pulse 62. Patient also feels very sleepy today. Reason for Disposition  [1] Systolic BP 90-110 AND [2] taking blood pressure medications AND [3] dizzy, lightheaded or weak  Answer Assessment - Initial Assessment Questions 1. BLOOD PRESSURE: What is the blood pressure? Did you take at least two measurements 5 minutes apart?     93/52 58, 91/47 62 2. ONSET: When did you take your blood pressure?     Upon waking this morning 3. HOW: How did you obtain the blood pressure? (e.g., visiting nurse, automatic home BP monitor)     Home cuff 4. HISTORY: Do you have a history of low blood pressure? What is your blood pressure normally?     No  5. MEDICINES: Are you taking any medications for blood pressure? If Yes, ask: Have they been changed recently?     Taking as prescribed, but none today.  6. PULSE RATE: Do you know what your pulse rate is?      baseline 7. OTHER SYMPTOMS: Have you been sick recently? Have you had a recent injury?     Fatigue. Weakness when she woke up this morning.   Additional info: 1) Falls asleep easily and frequently. States this morning she sat back down and bed and just fell over on her face (in bed) and sleep for several more minutes. Feels overall well at this moment.  2) States while at a pain management appointment she  developed sudden fatigue, was noted to have hypotension and sent via ambulance to hospital. She states she feels similar this time.  3) Refusing evaluation today, states she will call back if hypotension continues. Agrees to proceed to UC/ER for worsening symptoms, decreasing blood pressure, or new concerning symptoms.  Protocols used: Blood Pressure - Low-A-AH

## 2024-03-26 ENCOUNTER — Other Ambulatory Visit: Payer: Self-pay | Admitting: Internal Medicine

## 2024-03-26 DIAGNOSIS — G8929 Other chronic pain: Secondary | ICD-10-CM

## 2024-04-14 ENCOUNTER — Telehealth: Payer: Self-pay

## 2024-04-14 NOTE — Telephone Encounter (Signed)
 Copied from CRM [id number]. Topic: General - Other >> Apr 14, 2024  3:05 PM Carlyon D wrote: Reason for CRM: Pt is asking if Dr. Damien write up a letter in regards to her daughter being her care taker Daughters name is Clayborne Sow. Needs to be faxed to DSS office  to [facility] Fax # [phone]. Please update pt in regards to the status of this

## 2024-04-18 NOTE — Telephone Encounter (Signed)
Letter prnted.

## 2024-04-19 NOTE — Telephone Encounter (Signed)
 Message left for patient today regarding pick up.  Letter left up front

## 2024-04-29 ENCOUNTER — Other Ambulatory Visit: Payer: Self-pay | Admitting: Internal Medicine

## 2024-05-18 ENCOUNTER — Encounter: Admitting: Internal Medicine

## 2024-05-18 ENCOUNTER — Other Ambulatory Visit: Payer: Self-pay | Admitting: Internal Medicine

## 2024-05-26 ENCOUNTER — Other Ambulatory Visit: Payer: Self-pay | Admitting: Internal Medicine

## 2024-06-10 ENCOUNTER — Other Ambulatory Visit: Payer: Self-pay | Admitting: Internal Medicine

## 2024-06-15 ENCOUNTER — Other Ambulatory Visit: Payer: Self-pay | Admitting: Internal Medicine

## 2024-06-16 ENCOUNTER — Telehealth: Payer: Self-pay

## 2024-06-16 NOTE — Telephone Encounter (Signed)
 Copied from CRM [id number]. Topic: General - Call Back - No Documentation >> Jun 16, 2024  2:41 PM Rea C wrote: Reason for CRM: Patient called in and stated if the clinic can mail out the letter that was prepared for her to state that her daughter is her caretaker. Patients states the letter is already written by Dr. Geofm. She is just wondering if Dr. Geofm can mail it out to her. Address on file.   [phone] (M)

## 2024-06-17 NOTE — Telephone Encounter (Signed)
 Mailed out today

## 2024-06-18 ENCOUNTER — Other Ambulatory Visit: Payer: Self-pay | Admitting: Internal Medicine

## 2024-06-18 DIAGNOSIS — G8929 Other chronic pain: Secondary | ICD-10-CM

## 2024-06-24 ENCOUNTER — Other Ambulatory Visit: Payer: Self-pay | Admitting: Internal Medicine

## 2024-06-24 ENCOUNTER — Encounter: Payer: Self-pay | Admitting: Internal Medicine

## 2024-06-24 NOTE — Telephone Encounter (Signed)
 Copied from CRM [id number]. Topic: General - Other >> Jun 23, 2024  4:35 PM Viola F wrote: Reason for CRM: Patient received letter in the mail that she requested stating her daughter is her caretaker but it had the incorrect name of her daughter Jolena Lye and she doesn't know who that is - her daughters name is Building surveyor. She requested that a new letter be mailed out with her daughter name. Please let patient know when letter is mailed out

## 2024-06-24 NOTE — Telephone Encounter (Signed)
 Letter updated and will be mailed out tomorrow.

## 2024-07-05 ENCOUNTER — Encounter: Payer: Self-pay | Admitting: Radiology

## 2024-07-07 ENCOUNTER — Encounter: Payer: Self-pay | Admitting: Internal Medicine

## 2024-07-07 NOTE — Patient Instructions (Addendum)
      Blood work was ordered.       Medications changes include :   None    A referral was ordered and someone will call you to schedule an appointment.     Return in about 6 months (around 01/05/2025) for follow up.

## 2024-07-07 NOTE — Progress Notes (Signed)
 Subjective:    Patient ID: Jill Phillips, female    DOB: Jun 26, 1956, 68 y.o.   MRN: [id number]      HPI Maecie is here for a Physical exam and her chronic medical problems.      Medications and allergies reviewed with patient and updated if appropriate.  Current Outpatient Medications on File Prior to Visit  Medication Sig Dispense Refill  . acetaminophen  (TYLENOL ) 500 MG tablet Take 1,000 mg by mouth daily as needed for moderate pain or headache.    . amLODipine -benazepril  (LOTREL) 10-40 MG capsule TAKE 1 CAPSULE BY MOUTH DAILY. 90 capsule 1  . atorvastatin  (LIPITOR) 20 MG tablet TAKE 1 TABLET BY MOUTH DAILY. 90 tablet 1  . BREZTRI  AEROSPHERE 160-9-4.8 MCG/ACT AERO inhaler INHALE 2 PUFFS INTO THE LUNGS 2 TIMES DAILY. 10.7 g 5  . clonazePAM  (KLONOPIN ) 1 MG tablet TAKE 1 TABLET BY MOUTH AT BEDTIME. (EARLY FILL 7/2, REFILL AVAILABLE 8/1) 30 tablet 3  . docusate sodium  (COLACE) 100 MG capsule Take 200 mg by mouth at bedtime.    . fentaNYL  (DURAGESIC ) 25 MCG/HR Place 1 patch onto the skin every 3 (three) days.    . gabapentin  (NEURONTIN ) 400 MG capsule TAKE 1 CAPSULE BY MOUTH 3 TIMES DAILY. 270 capsule 1  . HYDROcodone -acetaminophen  (NORCO) 10-325 MG tablet Take 1 tablet by mouth 3 (three) times daily as needed.    . levothyroxine  (SYNTHROID ) 88 MCG tablet TAKE 1 TABLET BY MOUTH DAILY. 90 tablet 1  . NARCAN  4 MG/0.1ML LIQD nasal spray kit Place 1 spray into the nose as needed (accidental overdose). 1 each 0  . nebivolol  (BYSTOLIC ) 10 MG tablet TAKE 1 TABLET (10 MG TOTAL) BY MOUTH DAILY. 90 tablet 1  . nicotine  (NICODERM CQ  - DOSED IN MG/24 HOURS) 21 mg/24hr patch PLACE 1 PATCH (21 MG TOTAL) ONTO THE SKIN DAILY. 28 patch 2  . Psyllium (VEGETABLE LAXATIVE PO) Take 1 tablet by mouth daily.    . rOPINIRole  (REQUIP ) 1 MG tablet Take 1-2 tablets (1-2 mg total) by mouth 2 (two) times daily as needed (restless leg). 60 tablet 0  . solifenacin (VESICARE) 5 MG tablet Take 10 mg by mouth daily.     . tiZANidine  (ZANAFLEX ) 4 MG tablet TAKE 1 TABLET BY MOUTH EVERY 8 HOURS AS NEEDED FOR MUSCLE SPASMS. 90 tablet 2  . venlafaxine  XR (EFFEXOR -XR) 150 MG 24 hr capsule TAKE 2 CAPSULES BY MOUTH DAILY. 180 capsule 1   No current facility-administered medications on file prior to visit.    Review of Systems     Objective:  There were no vitals filed for this visit. There were no vitals filed for this visit. There is no height or weight on file to calculate BMI.  BP Readings from Last 3 Encounters:  01/21/24 102/70  01/16/24 104/66  11/25/23 112/62    Wt Readings from Last 3 Encounters:  01/21/24 148 lb (67.1 kg)  01/16/24 158 lb 11.7 oz (72 kg)  11/25/23 145 lb 3.2 oz (65.9 kg)       Physical Exam Constitutional: She appears well-developed and well-nourished. No distress.  HENT:  Head: Normocephalic and atraumatic.  Right Ear: External ear normal. Normal ear canal and TM Left Ear: External ear normal.  Normal ear canal and TM Mouth/Throat: Oropharynx is clear and moist.  Eyes: Conjunctivae normal.  Neck: Neck supple. No tracheal deviation present. No thyromegaly present.  No carotid bruit  Cardiovascular: Normal rate, regular rhythm and normal heart sounds.  No murmur heard.  No edema. Pulmonary/Chest: Effort normal and breath sounds normal. No respiratory distress. She has no wheezes. She has no rales.  Breast: deferred   Abdominal: Soft. She exhibits no distension. There is no tenderness.  Lymphadenopathy: She has no cervical adenopathy.  Skin: Skin is warm and dry. She is not diaphoretic.  Psychiatric: She has a normal mood and affect. Her behavior is normal.     Lab Results  Component Value Date   WBC 6.2 01/16/2024   HGB 11.7 (L) 01/16/2024   HCT 37.4 01/16/2024   PLT 194 01/16/2024   GLUCOSE 71 01/16/2024   CHOL 193 11/25/2023   TRIG 197.0 (H) 11/25/2023   HDL 50.00 11/25/2023   LDLDIRECT 110.0 07/16/2021   LDLCALC 104 (H) 11/25/2023   ALT 12  01/16/2024   AST 18 01/16/2024   NA 144 01/16/2024   K 3.9 01/16/2024   CL 106 01/16/2024   CREATININE 1.32 (H) 01/16/2024   BUN 21 01/16/2024   CO2 27 01/16/2024   TSH 3.05 11/25/2023   INR 1.0 01/16/2024   HGBA1C 6.5 11/25/2023         Assessment & Plan:   Physical exam: Screening blood work  ordered Exercise   Weight   Substance abuse  none   Reviewed recommended immunizations.   Health Maintenance  Topic Date Due  . Diabetic kidney evaluation - Urine ACR  Never done  . Zoster Vaccines- Shingrix (1 of 2) Never done  . Mammogram  Never done  . Colonoscopy  11/23/2019  . COVID-19 Vaccine (3 - Moderna risk series) 08/27/2020  . FOOT EXAM  06/12/2023  . DTaP/Tdap/Td (2 - Td or Tdap) 07/15/2023  . Influenza Vaccine  04/02/2024  . OPHTHALMOLOGY EXAM  04/02/2024  . HEMOGLOBIN A1C  05/27/2024  . Medicare Annual Wellness (AWV)  07/17/2024  . Lung Cancer Screening  11/24/2024 (Originally 06/23/2022)  . Diabetic kidney evaluation - eGFR measurement  01/15/2025  . Pneumococcal Vaccine: 50+ Years  Completed  . Bone Density Scan  Completed  . Hepatitis C Screening  Completed  . Meningococcal B Vaccine  Aged Out          See Problem List for Assessment and Plan of chronic medical problems.     This encounter was created in error - please disregard.

## 2024-07-08 ENCOUNTER — Ambulatory Visit

## 2024-07-08 ENCOUNTER — Encounter: Admitting: Internal Medicine

## 2024-07-08 DIAGNOSIS — R32 Unspecified urinary incontinence: Secondary | ICD-10-CM

## 2024-07-08 DIAGNOSIS — G4709 Other insomnia: Secondary | ICD-10-CM

## 2024-07-08 DIAGNOSIS — Z8739 Personal history of other diseases of the musculoskeletal system and connective tissue: Secondary | ICD-10-CM

## 2024-07-08 DIAGNOSIS — E038 Other specified hypothyroidism: Secondary | ICD-10-CM

## 2024-07-08 DIAGNOSIS — F419 Anxiety disorder, unspecified: Secondary | ICD-10-CM

## 2024-07-08 DIAGNOSIS — F3289 Other specified depressive episodes: Secondary | ICD-10-CM

## 2024-07-08 DIAGNOSIS — J439 Emphysema, unspecified: Secondary | ICD-10-CM

## 2024-07-08 DIAGNOSIS — E1159 Type 2 diabetes mellitus with other circulatory complications: Secondary | ICD-10-CM

## 2024-07-08 DIAGNOSIS — G2581 Restless legs syndrome: Secondary | ICD-10-CM

## 2024-07-08 DIAGNOSIS — E1122 Type 2 diabetes mellitus with diabetic chronic kidney disease: Secondary | ICD-10-CM

## 2024-07-08 DIAGNOSIS — F1190 Opioid use, unspecified, uncomplicated: Secondary | ICD-10-CM

## 2024-07-08 DIAGNOSIS — E1169 Type 2 diabetes mellitus with other specified complication: Secondary | ICD-10-CM

## 2024-07-08 DIAGNOSIS — K219 Gastro-esophageal reflux disease without esophagitis: Secondary | ICD-10-CM

## 2024-07-08 DIAGNOSIS — K5903 Drug induced constipation: Secondary | ICD-10-CM

## 2024-07-08 DIAGNOSIS — N1832 Chronic kidney disease, stage 3b: Secondary | ICD-10-CM

## 2024-07-08 DIAGNOSIS — Z0000 Encounter for general adult medical examination without abnormal findings: Secondary | ICD-10-CM

## 2024-07-08 NOTE — Assessment & Plan Note (Signed)
Chronic Regular exercise and healthy diet encouraged Check lipid panel, CMP Continue atorvastatin 20 mg daily 

## 2024-07-08 NOTE — Assessment & Plan Note (Signed)
Chronic Controlled, Stable Continue Effexor XR 300 mg daily, clonazepam 1 mg once bedtime

## 2024-07-08 NOTE — Assessment & Plan Note (Signed)
 Chronic Sleeps through the night  Would like to continue to decrease her medications - discussed high risk due to polypharmacy Controlled Continue seroquel  200 mg nightly

## 2024-07-08 NOTE — Assessment & Plan Note (Signed)
Chronic Controlled Continue Valtrex 500 mg daily 

## 2024-07-08 NOTE — Assessment & Plan Note (Signed)
 Chronic Partially related to chronic opioid use Taking stool softner, senna and MiraLAX  as needed

## 2024-07-08 NOTE — Assessment & Plan Note (Signed)
 Chronic Difficulty holding urine Likely combination of stress and urge incontinence Has seen urology in the past and does have interstitial cystitis, chronic pelvic pain Continue Vesicare 10 mg daily Incontinence supplies are medically necessary

## 2024-07-08 NOTE — Assessment & Plan Note (Signed)
 Chronic kidney disease stage 3b GFR has been variable CMP , CBC, vitamin D  level Stressed trying to decrease medications Stressed increase fluids Stressed smoking cessation  - use nicotine  patches Stressed no NSAIDs Discussed importance of good blood pressure and sugar control

## 2024-07-08 NOTE — Assessment & Plan Note (Signed)
 Chronic Associated with chronic kidney disease stage 3b, hypertension, hyperlipidemia  Lab Results  Component Value Date   HGBA1C 6.5 11/25/2023   Sugars well controlled with lifestyle Check A1c, urine albumin /creatinine ratio Continue lifestyle control

## 2024-07-08 NOTE — Assessment & Plan Note (Signed)
 Chronic  Check tsh and will titrate med dose if needed Continue levothyroxine  88 mcg daily

## 2024-07-08 NOTE — Assessment & Plan Note (Signed)
Chronic GERD controlled Continue omeprazole 40 mg twice daily

## 2024-07-08 NOTE — Assessment & Plan Note (Signed)
 Chronic BP controlled  CBC, CMP Continue amlodipine -benazepril  10-40 mg daily, nebivolol  10 mg daily

## 2024-07-08 NOTE — Assessment & Plan Note (Signed)
 Chronic Has chronic cough with chest congestion, denies SOB, wheeze, but not very active Continue  breztri 2 puffs bid - uses prn Stressed smoking cessation

## 2024-07-08 NOTE — Assessment & Plan Note (Signed)
 Chronic Taking gabapentin 400 mg TID, tizanidine 4 mg nightly On requip

## 2024-07-08 NOTE — Assessment & Plan Note (Signed)
Chronic Controlled, Stable Continue Effexor XR 300 mg daily

## 2024-07-08 NOTE — Assessment & Plan Note (Signed)
 Chronic No current symptoms Check uric acid level

## 2024-07-08 NOTE — Assessment & Plan Note (Signed)
 Chronic Following with pain management Currently on hydrocodone  10-325 mg every 3 hours as needed and fentanyl  patch 25 mcg/h

## 2024-07-09 ENCOUNTER — Ambulatory Visit: Payer: Self-pay

## 2024-07-09 NOTE — Telephone Encounter (Signed)
 FYI Only or Action Required?: FYI only for provider: advised UC.  Patient was last seen in primary care on 01/21/2024 by Geofm Glade PARAS, MD.  Called Nurse Triage reporting Palpitations, Breathing Problem, and Fatigue.  Symptoms began a week ago.  Interventions attempted: Rest, hydration, or home remedies.  Symptoms are: unchanged.  Triage Disposition: See HCP Within 4 Hours (Or PCP Triage)  Patient/caregiver understands and will follow disposition?: Unsure          Copied from CRM [id number]. Topic: Clinical - Red Word Triage >> Jul 09, 2024  2:57 PM Alfonso ORN wrote: Red Word that prompted transfer to Nurse Triage: heart has been quivering , trouble breathing , foot swollen, fatigue Reason for Disposition  Age > 60 years  (Exception: Brief heartbeat symptoms that went away and now feels well.)    Triager advised local UC for further evaluation.  Answer Assessment - Initial Assessment Questions 1. DESCRIPTION: Please describe your heart rate or heartbeat that you are having (e.g., fast/slow, regular/irregular, skipped or extra beats, palpitations)     Quivering 2. ONSET: When did it start? (e.g., minutes, hours, days)      X 1 week 3. DURATION: How long does it last (e.g., seconds, minutes, hours)     15-20 mins, reports is still able to fall asleep 4. PATTERN Does it come and go, or has it been constant since it started?  Does it get worse with exertion?   Are you feeling it now?     Intermittent, mainly before bedtime 5. TAP: Using your hand, can you tap out what you are feeling on a chair or table in front of you, so that I can hear? Note: Not all patients can do this.       N/a 6. HEART RATE: Can you tell me your heart rate? How many beats in 15 seconds?  Note: Not all patients can do this.       N/a 7. RECURRENT SYMPTOM: Have you ever had this before? If Yes, ask: When was the last time? and What happened that time?      Fatigue, swollen  feet 8. CAUSE: What do you think is causing the palpitations?     unknown 9. CARDIAC HISTORY: Do you have any history of heart disease? (e.g., heart attack, angina, bypass surgery, angioplasty, arrhythmia)      denies 10. OTHER SYMPTOMS: Do you have any other symptoms? (e.g., dizziness, chest pain, sweating, difficulty breathing)       Trouble breathing - endorses INH yesterday without relief. Endorses SpO2 90's Triager does not appreciate audible SOB/wheezing during call. Pt is speaking in full sentences.  11. PREGNANCY: Is there any chance you are pregnant? When was your last menstrual period?       N/a  Protocols used: Heart Rate and Heartbeat Questions-A-AH

## 2024-07-09 NOTE — Telephone Encounter (Unsigned)
 Copied from CRM [id number]. Topic: General - Other >> Jul 09, 2024  4:01 PM Sasha M wrote: Reason for CRM: Pt called in to check the status of updated letter request from 10/23. Pt states there are 2 different names listed in this letter(angel hager is her daughter) and it needs to be corrected and resent out please. The second name appearing is Connor and it should say Chief Technology Officer. Please fix this mistake and send a new letter out as soon as possible, thank you

## 2024-07-12 ENCOUNTER — Other Ambulatory Visit: Payer: Self-pay | Admitting: Internal Medicine

## 2024-07-12 NOTE — Telephone Encounter (Signed)
 Message left for patient.  She needs an appointment for evaluation.

## 2024-07-12 NOTE — Telephone Encounter (Signed)
 Letter printed again and stuck in mail today.  It has already been updated previously with correct name for letter that went on last month on 06/24/24.

## 2024-07-21 ENCOUNTER — Other Ambulatory Visit: Payer: Self-pay | Admitting: Internal Medicine

## 2024-08-03 ENCOUNTER — Other Ambulatory Visit: Payer: Self-pay | Admitting: Internal Medicine

## 2024-08-06 ENCOUNTER — Other Ambulatory Visit: Payer: Self-pay | Admitting: Internal Medicine

## 2024-08-10 ENCOUNTER — Ambulatory Visit

## 2024-08-10 VITALS — Ht 62.5 in | Wt 148.0 lb

## 2024-08-10 DIAGNOSIS — F17200 Nicotine dependence, unspecified, uncomplicated: Secondary | ICD-10-CM

## 2024-08-10 DIAGNOSIS — Z0000 Encounter for general adult medical examination without abnormal findings: Secondary | ICD-10-CM

## 2024-08-10 DIAGNOSIS — Z122 Encounter for screening for malignant neoplasm of respiratory organs: Secondary | ICD-10-CM

## 2024-08-10 DIAGNOSIS — Z1231 Encounter for screening mammogram for malignant neoplasm of breast: Secondary | ICD-10-CM

## 2024-08-10 NOTE — Progress Notes (Signed)
 Chief Complaint  Patient presents with   Medicare Wellness     Subjective:   Jill Phillips is a 68 y.o. female who presents for a Medicare Annual Wellness Visit.  Visit info / Clinical Intake: Medicare Wellness Visit Type:: Subsequent Annual Wellness Visit Persons participating in visit and providing information:: patient Medicare Wellness Visit Mode:: Telephone If telephone:: video declined Since this visit was completed virtually, some vitals may be partially provided or unavailable. Missing vitals are due to the limitations of the virtual format.: Documented vitals are patient reported If Telephone or Video please confirm:: I connected with patient using audio/video enable telemedicine. I verified patient identity with two identifiers, discussed telehealth limitations, and patient agreed to proceed. Patient Location:: Home Provider Location:: Office Interpreter Needed?: No Pre-visit prep was completed: yes AWV questionnaire completed by patient prior to visit?: no Living arrangements:: with family/others Patient's Overall Health Status Rating: (!) fair Typical amount of pain: some (back pain - 4 rating) Does pain affect daily life?: (!) yes Are you currently prescribed opioids?: no  Dietary Habits and Nutritional Risks How many meals a day?: 2 Eats fruit and vegetables daily?: yes Most meals are obtained by: preparing own meals; eating out In the last 2 weeks, have you had any of the following?: none Diabetic:: (!) yes Any non-healing wounds?: no How often do you check your BS?: 0 Would you like to be referred to a Nutritionist or for Diabetic Management? : no  Functional Status Activities of Daily Living (to include ambulation/medication): Independent Ambulation: Independent with device- listed below Home Assistive Devices/Equipment: Eyeglasses; Dentures (specify type); Johna Finder (specify Type); Oxygen  (uses Oxygen  prn) Medication Administration: Independent Home  Management (perform basic housework or laundry): Independent Manage your own finances?: yes Primary transportation is: facility / other Concerns about vision?: no *vision screening is required for WTM* Concerns about hearing?: no  Fall Screening Falls in the past year?: 0 Number of falls in past year: 0 Was there an injury with Fall?: 0 Fall Risk Category Calculator: 0 Patient Fall Risk Level: Low Fall Risk  Fall Risk Patient at Risk for Falls Due to: Impaired balance/gait; No Fall Risks Fall risk Follow up: Falls evaluation completed; Falls prevention discussed  Home and Transportation Safety: All rugs have non-skid backing?: N/A, no rugs All stairs or steps have railings?: N/A, no stairs Grab bars in the bathtub or shower?: yes Have non-skid surface in bathtub or shower?: yes Good home lighting?: yes Regular seat belt use?: yes Hospital stays in the last year:: no  Cognitive Assessment Difficulty concentrating, remembering, or making decisions? : no Will 6CIT or Mini Cog be Completed: yes What year is it?: 0 points What month is it?: 0 points Give patient an address phrase to remember (5 components): [address] [place], [place] About what time is it?: 0 points Count backwards from 20 to 1: 0 points Say the months of the year in reverse: 0 points Repeat the address phrase from earlier: 0 points 6 CIT Score: 0 points  Advance Directives (For Healthcare) Does Patient Have a Medical Advance Directive?: No Would patient like information on creating a medical advance directive?: No - Patient declined  Reviewed/Updated  Reviewed/Updated: Reviewed All (Medical, Surgical, Family, Medications, Allergies, Care Teams, Patient Goals)    Allergies (verified) Flagyl  [metronidazole ], Limonene, Sulfa antibiotics, Doxycycline , Ibuprofen , Amoxicillin , Chlorzoxazone, Codeine, Darvocet [propoxyphene n-acetaminophen ], Dilaudid  [hydromorphone  hcl], Hydralazine , Keflex  [cephalexin ],  Morphine  and codeine, Nitrofurantoin monohyd macro, Percocet [oxycodone -acetaminophen ], and Trazodone and nefazodone   Current  Medications (verified) Outpatient Encounter Medications as of 08/10/2024  Medication Sig   acetaminophen  (TYLENOL ) 500 MG tablet Take 1,000 mg by mouth daily as needed for moderate pain or headache.   amLODipine -benazepril  (LOTREL) 10-40 MG capsule TAKE 1 CAPSULE BY MOUTH DAILY.   atorvastatin  (LIPITOR) 20 MG tablet TAKE 1 TABLET BY MOUTH DAILY.   BREZTRI  AEROSPHERE 160-9-4.8 MCG/ACT AERO inhaler INHALE 2 PUFFS INTO THE LUNGS 2 TIMES DAILY.   clonazePAM  (KLONOPIN ) 1 MG tablet TAKE 1 TABLET BY MOUTH AT BEDTIME. (EARLY FILL 7/2, REFILL AVAILABLE 8/1)   docusate sodium  (COLACE) 100 MG capsule Take 200 mg by mouth at bedtime.   fentaNYL  (DURAGESIC ) 25 MCG/HR Place 1 patch onto the skin every 3 (three) days.   gabapentin  (NEURONTIN ) 400 MG capsule TAKE 1 CAPSULE BY MOUTH 3 TIMES DAILY.   HYDROcodone -acetaminophen  (NORCO) 10-325 MG tablet Take 1 tablet by mouth 3 (three) times daily as needed.   levothyroxine  (SYNTHROID ) 88 MCG tablet TAKE 1 TABLET BY MOUTH DAILY.   NARCAN  4 MG/0.1ML LIQD nasal spray kit Place 1 spray into the nose as needed (accidental overdose).   nebivolol  (BYSTOLIC ) 10 MG tablet TAKE 1 TABLET (10 MG TOTAL) BY MOUTH DAILY.   nicotine  (NICODERM CQ  - DOSED IN MG/24 HOURS) 21 mg/24hr patch PLACE 1 PATCH (21 MG TOTAL) ONTO THE SKIN DAILY.   omeprazole  (PRILOSEC) 40 MG capsule TAKE 1 CAPSULE BY MOUTH 2 TIMES DAILY BEFORE A MEAL.   Psyllium (VEGETABLE LAXATIVE PO) Take 1 tablet by mouth daily.   QUEtiapine  (SEROQUEL ) 200 MG tablet TAKE 1 TABLET (200 MG TOTAL) BY MOUTH AT BEDTIME.   rOPINIRole  (REQUIP ) 1 MG tablet Take 1-2 tablets (1-2 mg total) by mouth 2 (two) times daily as needed (restless leg).   solifenacin (VESICARE) 5 MG tablet Take 10 mg by mouth daily.   tiZANidine  (ZANAFLEX ) 4 MG tablet TAKE 1 TABLET BY MOUTH EVERY 8 HOURS AS NEEDED FOR MUSCLE SPASMS.    valACYclovir  (VALTREX ) 500 MG tablet TAKE 1 TABLET BY MOUTH DAILY.   venlafaxine  XR (EFFEXOR -XR) 150 MG 24 hr capsule TAKE 2 CAPSULES BY MOUTH DAILY.   No facility-administered encounter medications on file as of 08/10/2024.    History: Past Medical History:  Diagnosis Date   Active smoker    Anxiety    Calcifying tendinitis of shoulder    Chronic back pain    Chronic kidney disease 2022   related to potential overdose   Chronic pain syndrome    COPD (chronic obstructive pulmonary disease) (HCC)    Dyspnea    with exertion   Dysthymic disorder    Emphysema lung (HCC)    GERD (gastroesophageal reflux disease)    Heart murmur    for years, nothing to be concerned about   Herpes genitalia    History of blood transfusion 09/02/1978   related to back surgery   Hyperlipidemia    Hypertension    Hypothyroidism    Lumbago    Osteoarthrosis, unspecified whether generalized or localized, lower leg    Pain in joint, upper arm    Pneumothorax, left 01/31/2016   S/P Left posterior subcostal pain injection on 01/30/2016   PONV (postoperative nausea and vomiting)    gets nauseous  with longer surgery. Difficuty voiding after surgery   Postlaminectomy syndrome, thoracic region    Pre-diabetes    Primary localized osteoarthrosis, lower leg    Restless leg syndrome    Sleep apnea    s/p surgery- last sleep study 2011- doesnt use  oxygen  or machine at night as instructed,.   12/2014- Dr Shellia  reports it is negative.   Thyroid  disease    Past Surgical History:  Procedure Laterality Date   APPENDECTOMY     BACK SURGERY     18 back surgeries (2 thoracic & 16 lumbar) (01/31/2016)   DILATION AND CURETTAGE OF UTERUS     HAMMER TOE SURGERY     IR RADIOLOGIST EVAL & MGMT  07/21/2017   JOINT REPLACEMENT     KNEE ARTHROSCOPY Right    LAPAROSCOPIC CHOLECYSTECTOMY     LUMBAR FUSION N/A 08/01/2015   Procedure: Right sided L1-2 and L2-3 transforaminal lumbar interbody fusion with cages,  Extension of posterior fusion T12 to L3, Replaced pedicle screws bilaterally L1-L2 , Replaced left sided pedicle screws L-3. Instrumentation T12 to L3 using local bone graft, Vivigen allograft and cancellous chips;  Surgeon: Lynwood FORBES Better, MD;  Location: [facility] OR;  Service: Orthopedics;  Laterality: N/A;   LUMBAR LAMINECTOMY/DECOMPRESSION MICRODISCECTOMY N/A 01/28/2014   Procedure: Minimally Invasive Right  L1-2 Microdiscectomy;  Surgeon: Lynwood FORBES Better, MD;  Location: [facility] OR;  Service: Orthopedics;  Laterality: N/A;   REVERSE SHOULDER ARTHROPLASTY Left 09/09/2019   Procedure: LEFT REVERSE SHOULDER REPLACEMENT;  Surgeon: Addie Cordella Hamilton, MD;  Location: [facility] OR;  Service: Orthopedics;  Laterality: Left;   TOTAL HIP ARTHROPLASTY Right    TOTAL KNEE ARTHROPLASTY  05/29/2012   Procedure: TOTAL KNEE ARTHROPLASTY;  Surgeon: Lonni CINDERELLA Poli, MD;  Location: [facility] ORS;  Service: Orthopedics;  Laterality: Right;  Right Total Knee Arthroplasty   TOTAL KNEE ARTHROPLASTY Left 02/11/2023   Procedure: LEFT TOTAL KNEE ARTHROPLASTY;  Surgeon: Poli Lonni CINDERELLA, MD;  Location: [facility] OR;  Service: Orthopedics;  Laterality: Left;   TUBAL LIGATION     UVULOPALATOPHARYNGOPLASTY     VAGINAL HYSTERECTOMY     Family History  Problem Relation Age of Onset   Kidney disease Mother    Heart disease Father    Heart disease Sister 70       s/p CABG   Hypertension Sister    Anuerysm Brother 35       brain   Heart disease Brother    Colon cancer Neg Hx    Breast cancer Neg Hx    Ovarian cancer Neg Hx    Social History   Occupational History   Occupation: disability    Comment: back surgeries  Tobacco Use   Smoking status: Every Day    Current packs/day: 1.00    Average packs/day: 1 pack/day for 54.9 years (54.9 ttl pk-yrs)    Types: Cigarettes    Start date: 09/02/1969   Smokeless tobacco: Never   Tobacco comments:    Uses Nicoderm patch discussed 1-800-quit-now  Vaping Use   Vaping status: Never Used   Substance and Sexual Activity   Alcohol  use: No    Alcohol /week: 0.0 standard drinks of alcohol    Drug use: Not Currently    Types: Marijuana    Comment: 01/31/2016 none since ~ 1980   Sexual activity: Not Currently    Partners: Male    Birth control/protection: Surgical   Tobacco Counseling Ready to quit: No Counseling given: Yes Tobacco comments: Uses Nicoderm patch discussed 1-800-quit-now  SDOH Screenings   Food Insecurity: No Food Insecurity (08/10/2024)  Housing: Unknown (08/10/2024)  Transportation Needs: No Transportation Needs (08/10/2024)  Utilities: Not At Risk (08/10/2024)  Alcohol  Screen: Low Risk  (07/18/2023)  Depression (PHQ2-9): Low Risk  (08/10/2024)  Financial Resource Strain: Low  Risk  (11/06/2023)  Physical Activity: Inactive (08/10/2024)  Social Connections: Socially Isolated (08/10/2024)  Stress: No Stress Concern Present (08/10/2024)  Tobacco Use: High Risk (08/10/2024)  Health Literacy: Adequate Health Literacy (08/10/2024)   See flowsheets for full screening details  Depression Screen PHQ 2 & 9 Depression Scale- Over the past 2 weeks, how often have you been bothered by any of the following problems? Little interest or pleasure in doing things: 0 Feeling down, depressed, or hopeless (PHQ Adolescent also includes...irritable): 0 PHQ-2 Total Score: 0 Trouble falling or staying asleep, or sleeping too much: 0 Feeling tired or having little energy: 0 Poor appetite or overeating (PHQ Adolescent also includes...weight loss): 0 Feeling bad about yourself - or that you are a failure or have let yourself or your family down: 0 Trouble concentrating on things, such as reading the newspaper or watching television (PHQ Adolescent also includes...like school work): 0 Moving or speaking so slowly that other people could have noticed. Or the opposite - being so fidgety or restless that you have been moving around a lot more than usual: 2 (back pain) Thoughts that you  would be better off dead, or of hurting yourself in some way: 0 PHQ-9 Total Score: 2 If you checked off any problems, how difficult have these problems made it for you to do your work, take care of things at home, or get along with other people?: Not difficult at all  Depression Treatment Depression Interventions/Treatment : EYV7-0 Score <4 Follow-up Not Indicated; Medication; Currently on Treatment     Goals Addressed               This Visit's Progress     Patient Stated (pt-stated)        Patient stated she plans to continue staying active - taking meds daily also             Objective:    Today's Vitals   08/10/24 1520  Weight: 148 lb (67.1 kg)  Height: 5' 2.5 (1.588 m)   Body mass index is 26.64 kg/m.  Hearing/Vision screen Hearing Screening - Comments:: Denies hearing difficulties   Vision Screening - Comments:: Wears rx glasses - up to date with routine eye exams with [facility] Immunizations and Health Maintenance Health Maintenance  Topic Date Due   Diabetic kidney evaluation - Urine ACR  Never done   Zoster Vaccines- Shingrix (1 of 2) Never done   Mammogram  Never done   Colonoscopy  11/23/2019   COVID-19 Vaccine (3 - Moderna risk series) 08/27/2020   FOOT EXAM  06/12/2023   DTaP/Tdap/Td (2 - Td or Tdap) 07/15/2023   Influenza Vaccine  04/02/2024   OPHTHALMOLOGY EXAM  04/02/2024   HEMOGLOBIN A1C  05/27/2024   Lung Cancer Screening  11/24/2024 (Originally 06/23/2022)   Diabetic kidney evaluation - eGFR measurement  01/15/2025   Medicare Annual Wellness (AWV)  08/10/2025   Pneumococcal Vaccine: 50+ Years  Completed   Bone Density Scan  Completed   Hepatitis C Screening  Completed   Meningococcal B Vaccine  Aged Out        Assessment/Plan:  This is a routine wellness examination for Jill Phillips.  Mammogram status: Ordered today  Lung Cancer Screening:  Ordered today  Colonoscopy status: Pt declined.  Pt stated Pain Mgmt Provider at [facility] ordered a Cologuard in which she just completed 2 days ago - waiting results.  Ophthalmology status: Pt plans to schedule a Diabetic Eye exam for 08/2024 w/[facility].  Patient Care Team: Geofm Glade PARAS, MD as PCP - General (Internal Medicine) Alvan Ronal BRAVO, MD (Inactive) as PCP - Cardiology (Cardiology) Szabat, Toribio BROCKS, [facility] (Inactive) as Pharmacist (Pharmacist) Jerrye Lamar CHRISTELLA Mickey., MD as Referring Physician (Family Medicine)  I have personally reviewed and noted the following in the patient's chart:   Medical and social history Use of alcohol , tobacco or illicit drugs  Current medications and supplements including opioid prescriptions. Functional ability and status Nutritional status Physical activity Advanced directives List of other physicians Hospitalizations, surgeries, and ER visits in previous 12 months Vitals Screenings to include cognitive, depression, and falls Referrals and appointments  Orders Placed This Encounter  Procedures   MM 3D SCREENING MAMMOGRAM BILATERAL BREAST    Standing Status:   Future    Expiration Date:   08/10/2025    Reason for Exam (SYMPTOM  OR DIAGNOSIS REQUIRED):   screening for breast cancer    Preferred imaging location?:   [facility]   Ambulatory Referral for Lung Cancer Scre    Referral Priority:   Routine    Referral Type:   Consultation    Referral Reason:   Specialty Services Required    Referred to Provider:   Ruthell Lauraine FALCON, NP    Number of Visits Requested:   1   In addition, I have reviewed and discussed with patient certain preventive protocols, quality metrics, and best practice recommendations. A written personalized care plan for preventive services as well as general preventive health recommendations were provided to patient.   Jill Phillips, CMA   08/10/2024   Return in 1 year (on 08/10/2025).  After Visit Summary: (MyChart) Due to this being a telephonic visit, the after visit summary with patients  personalized plan was offered to patient via MyChart   Nurse Notes: scheduled CPE appt w/PCP for 12/2024.

## 2024-08-10 NOTE — Patient Instructions (Signed)
 Jill Phillips,  Thank you for taking the time for your Medicare Wellness Visit. I appreciate your continued commitment to your health goals. Please review the care plan we discussed, and feel free to reach out if I can assist you further.  Please note that Annual Wellness Visits do not include a physical exam. Some assessments may be limited, especially if the visit was conducted virtually. If needed, we may recommend an in-person follow-up with your provider.  Ongoing Care Seeing your primary care provider every 3 to 6 months helps us  monitor your health and provide consistent, personalized care.   Referrals If a referral was made during today's visit and you haven't received any updates within two weeks, please contact the referred provider directly to check on the status.  Recommended Screenings:  Health Maintenance  Topic Date Due   Yearly kidney health urinalysis for diabetes  Never done   Zoster (Shingles) Vaccine (1 of 2) Never done   Breast Cancer Screening  Never done   Colon Cancer Screening  11/23/2019   COVID-19 Vaccine (3 - Moderna risk series) 08/27/2020   Complete foot exam   06/12/2023   DTaP/Tdap/Td vaccine (2 - Td or Tdap) 07/15/2023   Flu Shot  04/02/2024   Eye exam for diabetics  04/02/2024   Hemoglobin A1C  05/27/2024   Screening for Lung Cancer  11/24/2024*   Yearly kidney function blood test for diabetes  01/15/2025   Medicare Annual Wellness Visit  08/10/2025   Pneumococcal Vaccine for age over 20  Completed   Osteoporosis screening with Bone Density Scan  Completed   Hepatitis C Screening  Completed   Meningitis B Vaccine  Aged Out  *Topic was postponed. The date shown is not the original due date.       08/10/2024    3:22 PM  Advanced Directives  Does Patient Have a Medical Advance Directive? No  Would patient like information on creating a medical advance directive? No - Patient declined    Vision: Annual vision screenings are recommended for early  detection of glaucoma, cataracts, and diabetic retinopathy. These exams can also reveal signs of chronic conditions such as diabetes and high blood pressure.  Dental: Annual dental screenings help detect early signs of oral cancer, gum disease, and other conditions linked to overall health, including heart disease and diabetes.

## 2024-08-13 ENCOUNTER — Other Ambulatory Visit: Payer: Self-pay | Admitting: Internal Medicine

## 2024-08-13 NOTE — Telephone Encounter (Signed)
 Called patient and left message for her to return call to clinic and schedule appointment. If she calls back please schedule.

## 2024-08-13 NOTE — Telephone Encounter (Signed)
Prescription refused - overdue for appointment with me.  Please call patient to schedule a follow up appointment or physical exam.   Ok to send in small supply of medication if needed to get them to their appointment.

## 2024-08-27 ENCOUNTER — Other Ambulatory Visit: Payer: Self-pay | Admitting: Internal Medicine

## 2024-09-03 ENCOUNTER — Other Ambulatory Visit: Payer: Self-pay | Admitting: Internal Medicine

## 2024-09-09 LAB — LAB REPORT - SCANNED
A1c: 6.2
EGFR: 50

## 2024-09-10 ENCOUNTER — Other Ambulatory Visit: Payer: Self-pay | Admitting: Internal Medicine

## 2024-09-15 ENCOUNTER — Other Ambulatory Visit: Payer: Self-pay | Admitting: Internal Medicine

## 2024-09-15 DIAGNOSIS — G8929 Other chronic pain: Secondary | ICD-10-CM

## 2024-09-15 NOTE — Patient Instructions (Addendum)
" ° ° ° ° °  Blood work was ordered.       Medications changes include :   Alendronate  once a  week for your bones   A mammogram was ordered.    A referral was ordered GI and someone will call you to schedule an appointment.     Return in about 6 months (around 03/16/2025) for Physical Exam.  "

## 2024-09-15 NOTE — Progress Notes (Signed)
 "     Subjective:    Patient ID: Jill Phillips, female    DOB: Oct 13, 1955, 69 y.o.   MRN: [id number]     HPI Jill Phillips is here for follow up of her chronic medical problems.  Positive Cologuard.   Bone density done and shows osteopenia - was advised she should be on fosamax .     Leg swelling - none first this in  the morning but as day progresses gets swelling.  Typically sits on edge of bed on her computer all day.   Discussed the use of AI scribe software for clinical note transcription with the patient, who gave verbal consent to proceed.  History of Present Illness Jill Phillips is a 69 year old female who presents with concerns about a positive Cologuard test and shortness of breath.  She is experiencing significant distress and anxiety over a positive Cologuard test, fearing the possibility of cancer despite not feeling generally unwell. She has had rectal pain for nine years and has been depressed since receiving the test results.  She experiences shortness of breath when walking around her house, with her blood oxygen  level dropping to 84%. This symptom began a couple of months ago. She has oxygen  at home but only uses it at night her oxygen  levels drop primarily with exertion. She reports wheezing but no coughing. No chest pain or palpitations recently.  She has a history of smoking but has cut back recently. Her insurance no longer covers nicotine  patches, and she has a limited supply left. She has increased her water  intake and switched from Diet Coke to Sprite, believing it is better for her kidneys. She occasionally experiences a dry mouth, which she associates with dehydration.  She reports swelling in her feet, which she manages by wearing compression socks. The swelling decreases overnight.  She suffers from restless legs, describing them as 'terrible, horrible pain.' She finds relief with a supplement, possibly D3 or K2, which she finds effective.  She recently received  a flu shot in November and is unsure about her need for other vaccinations. She mentions being due for a tetanus shot and expresses reluctance to receive a shingles vaccine. She has an upcoming eye exam scheduled for January 27th and has not had a mammogram in over twenty years.    Medications and allergies reviewed with patient and updated if appropriate.  Medications Ordered Prior to Encounter[1]   Review of Systems  Constitutional:  Negative for fever.  Respiratory:  Positive for shortness of breath (sometimes) and wheezing. Negative for cough.   Cardiovascular:  Positive for leg swelling. Negative for chest pain and palpitations.  Musculoskeletal:  Positive for arthralgias and back pain.  Neurological:  Positive for headaches (occ). Negative for light-headedness.  Psychiatric/Behavioral:  Positive for dysphoric mood (a little). The patient is nervous/anxious.        Objective:   Vitals:   09/16/24 1032  BP: 104/66  Pulse: 61  Temp: 98 F (36.7 C)  SpO2: 95%   BP Readings from Last 3 Encounters:  09/16/24 104/66  01/21/24 102/70  01/16/24 104/66   Wt Readings from Last 3 Encounters:  09/16/24 157 lb (71.2 kg)  08/10/24 148 lb (67.1 kg)  01/21/24 148 lb (67.1 kg)   Body mass index is 27.81 kg/m.    Physical Exam Constitutional:      General: She is not in acute distress.    Appearance: Normal appearance.  HENT:     Head: Normocephalic and atraumatic.  Eyes:     Conjunctiva/sclera: Conjunctivae normal.  Cardiovascular:     Rate and Rhythm: Normal rate and regular rhythm.     Heart sounds: Normal heart sounds.  Pulmonary:     Effort: Pulmonary effort is normal. No respiratory distress.     Breath sounds: Normal breath sounds. No wheezing.  Musculoskeletal:     Cervical back: Neck supple.     Right lower leg: No edema.     Left lower leg: No edema.  Lymphadenopathy:     Cervical: No cervical adenopathy.  Skin:    General: Skin is warm and dry.      Findings: No rash.  Neurological:     Mental Status: She is alert. Mental status is at baseline.  Psychiatric:        Mood and Affect: Mood normal.        Behavior: Behavior normal.        Lab Results  Component Value Date   WBC 6.2 01/16/2024   HGB 11.7 (L) 01/16/2024   HCT 37.4 01/16/2024   PLT 194 01/16/2024   GLUCOSE 71 01/16/2024   CHOL 193 11/25/2023   TRIG 197.0 (H) 11/25/2023   HDL 50.00 11/25/2023   LDLDIRECT 110.0 07/16/2021   LDLCALC 104 (H) 11/25/2023   ALT 12 01/16/2024   AST 18 01/16/2024   NA 144 01/16/2024   K 3.9 01/16/2024   CL 106 01/16/2024   CREATININE 1.32 (H) 01/16/2024   BUN 21 01/16/2024   CO2 27 01/16/2024   TSH 3.05 11/25/2023   INR 1.0 01/16/2024   HGBA1C 6.5 11/25/2023     Assessment & Plan:    See Problem List for Assessment and Plan of chronic medical problems.       [1]  Current Outpatient Medications on File Prior to Visit  Medication Sig Dispense Refill   acetaminophen  (TYLENOL ) 500 MG tablet Take 1,000 mg by mouth daily as needed for moderate pain or headache.     amLODipine -benazepril  (LOTREL) 10-40 MG capsule TAKE 1 CAPSULE BY MOUTH DAILY. 90 capsule 1   atorvastatin  (LIPITOR) 20 MG tablet TAKE 1 TABLET BY MOUTH DAILY. 30 tablet 0   BREZTRI  AEROSPHERE 160-9-4.8 MCG/ACT AERO inhaler INHALE 2 PUFFS INTO THE LUNGS 2 TIMES DAILY. 10.7 g 5   clonazePAM  (KLONOPIN ) 1 MG tablet TAKE 1 TABLET BY MOUTH AT BEDTIME. (EARLY FILL 7/2, REFILL AVAILABLE 8/1) 30 tablet 3   docusate sodium  (COLACE) 100 MG capsule Take 200 mg by mouth at bedtime.     fentaNYL  (DURAGESIC ) 25 MCG/HR Place 1 patch onto the skin every 3 (three) days.     gabapentin  (NEURONTIN ) 400 MG capsule TAKE 1 CAPSULE BY MOUTH 3 TIMES DAILY. 270 capsule 1   HYDROcodone -acetaminophen  (NORCO) 10-325 MG tablet Take 1 tablet by mouth 3 (three) times daily as needed.     levothyroxine  (SYNTHROID ) 88 MCG tablet TAKE 1 TABLET BY MOUTH DAILY. 90 tablet 1   NARCAN  4 MG/0.1ML LIQD  nasal spray kit Place 1 spray into the nose as needed (accidental overdose). 1 each 0   nebivolol  (BYSTOLIC ) 10 MG tablet TAKE 1 TABLET (10 MG TOTAL) BY MOUTH DAILY. 90 tablet 1   nicotine  (NICODERM CQ  - DOSED IN MG/24 HOURS) 21 mg/24hr patch PLACE 1 PATCH (21 MG TOTAL) ONTO THE SKIN DAILY. 28 patch 2   omeprazole  (PRILOSEC) 40 MG capsule TAKE 1 CAPSULE BY MOUTH 2 TIMES DAILY BEFORE A MEAL. 180 capsule 3   Psyllium (VEGETABLE LAXATIVE PO) Take 1  tablet by mouth daily.     QUEtiapine  (SEROQUEL ) 200 MG tablet TAKE 1 TABLET (200 MG TOTAL) BY MOUTH AT BEDTIME. 30 tablet 0   rOPINIRole  (REQUIP ) 1 MG tablet Take 1-2 tablets (1-2 mg total) by mouth 2 (two) times daily as needed (restless leg). 60 tablet 0   solifenacin (VESICARE) 5 MG tablet Take 10 mg by mouth daily.     tiZANidine  (ZANAFLEX ) 4 MG tablet TAKE 1 TABLET BY MOUTH EVERY 8 HOURS AS NEEDED FOR MUSCLE SPASMS. 90 tablet 2   valACYclovir  (VALTREX ) 500 MG tablet TAKE 1 TABLET BY MOUTH DAILY. 90 tablet 1   venlafaxine  XR (EFFEXOR -XR) 150 MG 24 hr capsule TAKE 2 CAPSULES BY MOUTH DAILY. 180 capsule 0   No current facility-administered medications on file prior to visit.   "

## 2024-09-16 ENCOUNTER — Ambulatory Visit: Payer: Self-pay | Admitting: Internal Medicine

## 2024-09-16 ENCOUNTER — Encounter: Payer: Self-pay | Admitting: Internal Medicine

## 2024-09-16 ENCOUNTER — Ambulatory Visit: Admitting: Internal Medicine

## 2024-09-16 VITALS — BP 104/66 | HR 61 | Temp 98.0°F | Ht 63.0 in | Wt 157.0 lb

## 2024-09-16 DIAGNOSIS — G4709 Other insomnia: Secondary | ICD-10-CM | POA: Diagnosis not present

## 2024-09-16 DIAGNOSIS — F419 Anxiety disorder, unspecified: Secondary | ICD-10-CM | POA: Diagnosis not present

## 2024-09-16 DIAGNOSIS — R600 Localized edema: Secondary | ICD-10-CM

## 2024-09-16 DIAGNOSIS — G2581 Restless legs syndrome: Secondary | ICD-10-CM

## 2024-09-16 DIAGNOSIS — E038 Other specified hypothyroidism: Secondary | ICD-10-CM | POA: Diagnosis not present

## 2024-09-16 DIAGNOSIS — E785 Hyperlipidemia, unspecified: Secondary | ICD-10-CM

## 2024-09-16 DIAGNOSIS — F3289 Other specified depressive episodes: Secondary | ICD-10-CM | POA: Diagnosis not present

## 2024-09-16 DIAGNOSIS — E1122 Type 2 diabetes mellitus with diabetic chronic kidney disease: Secondary | ICD-10-CM

## 2024-09-16 DIAGNOSIS — M8580 Other specified disorders of bone density and structure, unspecified site: Secondary | ICD-10-CM | POA: Insufficient documentation

## 2024-09-16 DIAGNOSIS — I152 Hypertension secondary to endocrine disorders: Secondary | ICD-10-CM | POA: Diagnosis not present

## 2024-09-16 DIAGNOSIS — G8929 Other chronic pain: Secondary | ICD-10-CM | POA: Diagnosis not present

## 2024-09-16 DIAGNOSIS — E1169 Type 2 diabetes mellitus with other specified complication: Secondary | ICD-10-CM

## 2024-09-16 DIAGNOSIS — E1159 Type 2 diabetes mellitus with other circulatory complications: Secondary | ICD-10-CM

## 2024-09-16 DIAGNOSIS — R300 Dysuria: Secondary | ICD-10-CM

## 2024-09-16 DIAGNOSIS — M85832 Other specified disorders of bone density and structure, left forearm: Secondary | ICD-10-CM

## 2024-09-16 DIAGNOSIS — J439 Emphysema, unspecified: Secondary | ICD-10-CM | POA: Diagnosis not present

## 2024-09-16 DIAGNOSIS — K219 Gastro-esophageal reflux disease without esophagitis: Secondary | ICD-10-CM | POA: Diagnosis not present

## 2024-09-16 DIAGNOSIS — N1832 Chronic kidney disease, stage 3b: Secondary | ICD-10-CM

## 2024-09-16 DIAGNOSIS — Z1231 Encounter for screening mammogram for malignant neoplasm of breast: Secondary | ICD-10-CM

## 2024-09-16 DIAGNOSIS — R195 Other fecal abnormalities: Secondary | ICD-10-CM

## 2024-09-16 LAB — CBC
HCT: 40 % (ref 36.0–46.0)
Hemoglobin: 13.1 g/dL (ref 12.0–15.0)
MCHC: 32.8 g/dL (ref 30.0–36.0)
MCV: 96.2 fl (ref 78.0–100.0)
Platelets: 242 K/uL (ref 150.0–400.0)
RBC: 4.16 Mil/uL (ref 3.87–5.11)
RDW: 14.6 % (ref 11.5–15.5)
WBC: 7 K/uL (ref 4.0–10.5)

## 2024-09-16 LAB — LIPID PANEL
Cholesterol: 151 mg/dL (ref 28–200)
HDL: 34.4 mg/dL — ABNORMAL LOW
LDL Cholesterol: 78 mg/dL (ref 10–99)
NonHDL: 116.17
Total CHOL/HDL Ratio: 4
Triglycerides: 191 mg/dL — ABNORMAL HIGH (ref 10.0–149.0)
VLDL: 38.2 mg/dL (ref 0.0–40.0)

## 2024-09-16 LAB — HEMOGLOBIN A1C: Hgb A1c MFr Bld: 6.3 % (ref 4.6–6.5)

## 2024-09-16 LAB — COMPREHENSIVE METABOLIC PANEL WITH GFR
ALT: 15 U/L (ref 3–35)
AST: 29 U/L (ref 5–37)
Albumin: 3.9 g/dL (ref 3.5–5.2)
Alkaline Phosphatase: 128 U/L — ABNORMAL HIGH (ref 39–117)
BUN: 15 mg/dL (ref 6–23)
CO2: 31 meq/L (ref 19–32)
Calcium: 9.1 mg/dL (ref 8.4–10.5)
Chloride: 104 meq/L (ref 96–112)
Creatinine, Ser: 1.13 mg/dL (ref 0.40–1.20)
GFR: 49.61 mL/min — ABNORMAL LOW
Glucose, Bld: 71 mg/dL (ref 70–99)
Potassium: 4.5 meq/L (ref 3.5–5.1)
Sodium: 140 meq/L (ref 135–145)
Total Bilirubin: 0.4 mg/dL (ref 0.2–1.2)
Total Protein: 6.8 g/dL (ref 6.0–8.3)

## 2024-09-16 LAB — POC URINALSYSI DIPSTICK (AUTOMATED)
Bilirubin, UA: NEGATIVE
Blood, UA: NEGATIVE
Clarity, UA: NEGATIVE
Glucose, UA: NEGATIVE
Ketones, UA: NEGATIVE
Leukocytes, UA: NEGATIVE
Nitrite, UA: NEGATIVE
Protein, UA: POSITIVE — AB
Spec Grav, UA: 1.02
Urobilinogen, UA: 0.2 U/dL
pH, UA: 6

## 2024-09-16 LAB — MICROALBUMIN / CREATININE URINE RATIO
Creatinine,U: 131.1 mg/dL
Microalb Creat Ratio: 8.3 mg/g (ref 0.0–30.0)
Microalb, Ur: 1.1 mg/dL (ref 0.7–1.9)

## 2024-09-16 LAB — TSH: TSH: 8.88 u[IU]/mL — ABNORMAL HIGH (ref 0.35–5.50)

## 2024-09-16 LAB — VITAMIN D 25 HYDROXY (VIT D DEFICIENCY, FRACTURES): VITD: 56.64 ng/mL (ref 30.00–100.00)

## 2024-09-16 MED ORDER — ALENDRONATE SODIUM 70 MG PO TABS: 70.0000 mg | ORAL_TABLET | ORAL | 3 refills | Status: AC

## 2024-09-16 NOTE — Assessment & Plan Note (Signed)
 Chronic Has chronic SOB, wheeze Continue  breztri  2 puffs bid Uses oxygen  at night Has noticed desaturation during the day sometimes with exertion - monitor - may need to start using oxygen  prn with exertion -- advised f/u with pulm Stressed smoking cessation

## 2024-09-16 NOTE — Assessment & Plan Note (Signed)
 Chronic Sleeps through the night  Controlled Continue seroquel  200 mg nightly

## 2024-09-16 NOTE — Assessment & Plan Note (Signed)
Chronic Controlled, Stable Continue Effexor XR 300 mg daily, clonazepam 1 mg once bedtime

## 2024-09-16 NOTE — Assessment & Plan Note (Signed)
Chronic Regular exercise and healthy diet encouraged Check lipid panel, CMP Continue atorvastatin 20 mg daily 

## 2024-09-16 NOTE — Assessment & Plan Note (Signed)
 Acute Check UA, Ucx

## 2024-09-16 NOTE — Assessment & Plan Note (Signed)
Chronic GERD controlled Continue omeprazole 40 mg twice daily

## 2024-09-16 NOTE — Assessment & Plan Note (Signed)
 Chronic BP controlled  CBC, CMP Continue amlodipine -benazepril  10-40 mg daily, nebivolol  10 mg daily

## 2024-09-16 NOTE — Assessment & Plan Note (Addendum)
 Chronic Recent dexa at [facility] alendronate  70 mg weekly - discuss possible increased GERD Dexa q 2 year Stressed smoking cessation

## 2024-09-16 NOTE — Assessment & Plan Note (Signed)
 Chronic Intermittent She has her legs down most of the day which is likely contributing and she is sedentary Elevated legs intermittently during the day Low sodium diet Can try compression socks Swelling goes down overnight

## 2024-09-16 NOTE — Assessment & Plan Note (Signed)
Chronic Controlled, Stable Continue Effexor XR 300 mg daily

## 2024-09-16 NOTE — Assessment & Plan Note (Addendum)
 Chronic Taking gabapentin  400 mg TID, tizanidine  4 mg nightly On requip   Taking a supplement with vitamin D3 and K and that is helping

## 2024-09-16 NOTE — Assessment & Plan Note (Signed)
 Chronic Pain in pelvis, vagina, rectum At her last visit I ordered for [facility] ob/gyn

## 2024-09-16 NOTE — Assessment & Plan Note (Signed)
 Chronic Associated with chronic kidney disease stage 3b, hypertension, hyperlipidemia  Lab Results  Component Value Date   HGBA1C 6.5 11/25/2023   Sugars well controlled with lifestyle Check A1c, urine albumin /creatinine ratio Continue lifestyle control

## 2024-09-16 NOTE — Assessment & Plan Note (Signed)
 Chronic kidney disease stage 3b GFR has been variable CMP , CBC, vitamin D  level Stressed trying to decrease medications Stressed increase fluids Stressed smoking cessation  - use nicotine  patches Stressed no NSAIDs Discussed importance of good blood pressure and sugar control

## 2024-09-16 NOTE — Assessment & Plan Note (Signed)
 Chronic  Check tsh and will titrate med dose if needed Continue levothyroxine  88 mcg daily

## 2024-09-18 MED ORDER — LEVOTHYROXINE SODIUM 100 MCG PO TABS: 100.0000 ug | ORAL_TABLET | Freq: Every day | ORAL | 3 refills | Status: AC

## 2024-09-19 LAB — CULTURE, URINE COMPREHENSIVE

## 2024-09-19 MED ORDER — FLUCONAZOLE 150 MG PO TABS: 150.0000 mg | ORAL_TABLET | Freq: Once | ORAL | 0 refills | Status: AC

## 2024-09-19 MED ORDER — CEPHALEXIN 500 MG PO CAPS: 500.0000 mg | ORAL_CAPSULE | Freq: Two times a day (BID) | ORAL | 0 refills | Status: AC

## 2024-09-20 ENCOUNTER — Telehealth: Payer: Self-pay

## 2024-09-20 NOTE — Telephone Encounter (Signed)
 Spoke with Jill Phillips and all questions answered regarding allergy and type.

## 2024-09-20 NOTE — Telephone Encounter (Signed)
 Copied from CRM [id number]. Topic: Clinical - Prescription Issue >> Sep 20, 2024  9:22 AM Eva FALCON wrote: Reason for CRM: Corean from [facility] was calling in to see if the patient has a true allergy to Keflex . States its listed at the pharmacy as an allergy, needs confirmation if it is a true one. States she's hoping to get a call back before 12:30 as that's when the delivery driver leaves and the patient gets medication delivered. Since its an antibiotic wants to make sure before sending out. Call back number [phone].

## 2024-12-08 ENCOUNTER — Encounter: Admitting: Internal Medicine

## 2025-08-12 ENCOUNTER — Ambulatory Visit
